# Patient Record
Sex: Female | Born: 1960 | Race: White | Hispanic: No | Smoking: Former smoker
Health system: Southern US, Community
[De-identification: ages and names within clinical notes are randomized; demographics above are authoritative.]

## PROBLEM LIST (undated history)

## (undated) DIAGNOSIS — J449 Chronic obstructive pulmonary disease, unspecified: Secondary | ICD-10-CM

## (undated) DIAGNOSIS — H409 Unspecified glaucoma: Secondary | ICD-10-CM

## (undated) DIAGNOSIS — E119 Type 2 diabetes mellitus without complications: Secondary | ICD-10-CM

## (undated) DIAGNOSIS — I10 Essential (primary) hypertension: Secondary | ICD-10-CM

## (undated) DIAGNOSIS — E785 Hyperlipidemia, unspecified: Secondary | ICD-10-CM

## (undated) DIAGNOSIS — E611 Iron deficiency: Secondary | ICD-10-CM

## (undated) DIAGNOSIS — F259 Schizoaffective disorder, unspecified: Secondary | ICD-10-CM

## (undated) HISTORY — DX: Unspecified glaucoma: H40.9

## (undated) HISTORY — DX: Chronic obstructive pulmonary disease, unspecified: J44.9

## (undated) HISTORY — DX: Iron deficiency: E61.1

## (undated) HISTORY — DX: Schizoaffective disorder, unspecified: F25.9

## (undated) HISTORY — DX: Essential (primary) hypertension: I10

## (undated) HISTORY — PX: TUBAL LIGATION: SHX77

## (undated) HISTORY — DX: Type 2 diabetes mellitus without complications: E11.9

## (undated) HISTORY — DX: Hyperlipidemia, unspecified: E78.5

---

## 2014-12-15 DIAGNOSIS — I10 Essential (primary) hypertension: Secondary | ICD-10-CM | POA: Diagnosis present

## 2014-12-15 DIAGNOSIS — E119 Type 2 diabetes mellitus without complications: Secondary | ICD-10-CM

## 2015-04-24 DIAGNOSIS — F209 Schizophrenia, unspecified: Secondary | ICD-10-CM

## 2015-04-24 DIAGNOSIS — E785 Hyperlipidemia, unspecified: Secondary | ICD-10-CM | POA: Diagnosis present

## 2018-06-05 DIAGNOSIS — D509 Iron deficiency anemia, unspecified: Secondary | ICD-10-CM | POA: Insufficient documentation

## 2018-06-06 DIAGNOSIS — H409 Unspecified glaucoma: Secondary | ICD-10-CM | POA: Diagnosis present

## 2018-09-25 ENCOUNTER — Ambulatory Visit (INDEPENDENT_AMBULATORY_CARE_PROVIDER_SITE_OTHER): Payer: Medicaid Other | Admitting: Obstetrics and Gynecology

## 2018-09-25 ENCOUNTER — Encounter: Payer: Self-pay | Admitting: Obstetrics and Gynecology

## 2018-09-25 ENCOUNTER — Other Ambulatory Visit: Payer: Self-pay

## 2018-09-25 VITALS — HR 77 | Ht 61.0 in | Wt 181.0 lb

## 2018-09-25 DIAGNOSIS — N852 Hypertrophy of uterus: Secondary | ICD-10-CM

## 2018-09-25 DIAGNOSIS — B356 Tinea cruris: Secondary | ICD-10-CM

## 2018-09-25 MED ORDER — NAFTIFINE HCL 2 % EX CREA: 1.0000 "application " | TOPICAL_CREAM | Freq: Every day | CUTANEOUS | 1 refills | Status: DC

## 2018-09-25 MED ORDER — KETOCONAZOLE 200 MG PO TABS: 200.0000 mg | ORAL_TABLET | Freq: Every day | ORAL | 0 refills | Status: DC

## 2018-09-25 NOTE — Progress Notes (Signed)
Patient ID: Teresa Murillo Nourse, female   DOB: 06-29-1960, 58 y.o.   MRN: [id number]  Reason for Consult: Referral (Feels like sshe has vaginal warts, there is a vaginal odor x months )   Referred by Linard MillersWilliams, Christine A, *  Subjective:     HPI:  Teresa Murillo Luis is a 58 y.o. female. She is here as a referral for genital warts. She is living in a group home. Her caregiver with her today says that she wears a diaper throughout the day and night.   Past Medical History:  Diagnosis Date  . Chronic obstructive pulmonary disease (COPD) (HCC)   . Glaucoma   . Hyperlipidemia   . Hypertension   . Iron deficiency   . Schizoaffective disorder (HCC)   . Type 2 diabetes mellitus (HCC)    Family History  Problem Relation Age of Onset  . Breast cancer Maternal Grandmother    Past Surgical History:  Procedure Laterality Date  . TUBAL LIGATION      Short Social History:  Social History   Tobacco Use  . Smoking status: Current Every Day Smoker    Packs/day: 1.00    Types: Cigars  . Smokeless tobacco: Current User  Substance Use Topics  . Alcohol use: Yes    Allergies  Allergen Reactions  . Penicillins     Current Outpatient Medications  Medication Sig Dispense Refill  . acetaminophen (TYLENOL) 500 MG tablet Take 500 mg by mouth every 6 (six) hours as needed.    Marland Kitchen. aspirin 81 MG EC tablet Take 81 mg by mouth daily. Swallow whole.    . benztropine (COGENTIN) 0.5 MG tablet Take 0.5 mg by mouth 2 (two) times daily.    . citalopram (CELEXA) 40 MG tablet Take 40 mg by mouth daily.    . divalproex (DEPAKOTE) 500 MG DR tablet Take 500 mg by mouth 3 (three) times daily.    . haloperidol decanoate (HALDOL DECANOATE) 100 MG/ML injection     . latanoprost (XALATAN) 0.005 % ophthalmic solution 1 drop at bedtime.    Marland Kitchen. lisinopril (ZESTRIL) 10 MG tablet Take 10 mg by mouth daily.    Marland Kitchen. LORazepam (ATIVAN) 0.5 MG tablet Take 0.5 mg by mouth every 8 (eight) hours.    . metFORMIN (GLUMETZA) 500 MG  (MOD) 24 hr tablet Take 500 mg by mouth daily with breakfast.    . metoprolol succinate (TOPROL-XL) 25 MG 24 hr tablet Take 25 mg by mouth daily.    Marland Kitchen. oxybutynin (DITROPAN) 5 MG tablet Take 5 mg by mouth 3 (three) times daily.    . pravastatin (PRAVACHOL) 20 MG tablet Take 20 mg by mouth daily.    Marland Kitchen. triamcinolone ointment (KENALOG) 0.5 % Apply 1 application topically 2 (two) times daily.    Marland Kitchen. ketoconazole (NIZORAL) 200 MG tablet Take 1 tablet (200 mg total) by mouth daily. 28 tablet 0  . Naftifine HCl 2 % CREA Apply 1 application topically daily for 14 days. 60 g 1   No current facility-administered medications for this visit.     Review of Systems  Constitutional: Negative for chills, fatigue, fever and unexpected weight change.  HENT: Negative for trouble swallowing.  Eyes: Negative for loss of vision.  Respiratory: Negative for cough, shortness of breath and wheezing.  Cardiovascular: Negative for chest pain, leg swelling, palpitations and syncope.  GI: Negative for abdominal pain, blood in stool, diarrhea, nausea and vomiting.  GU: Negative for difficulty urinating, dysuria, frequency and hematuria.  Musculoskeletal: Negative for  back pain, leg pain and joint pain.  Skin: Negative for rash.  Neurological: Negative for dizziness, headaches, light-headedness, numbness and seizures.  Psychiatric: Negative for behavioral problem, confusion, depressed mood and sleep disturbance.        Objective:  Objective   Vitals:   09/25/18 0956  Pulse: 77  Weight: 181 lb (82.1 kg)  Height: 5\' 1"  (1.549 m)   Body mass index is 34.2 kg/m.  Physical Exam Vitals signs and nursing note reviewed.  Constitutional:      Appearance: She is well-developed.  HENT:     Head: Normocephalic and atraumatic.  Eyes:     Pupils: Pupils are equal, round, and reactive to light.  Cardiovascular:     Rate and Rhythm: Normal rate and regular rhythm.  Pulmonary:     Effort: Pulmonary effort is normal. No  respiratory distress.  Genitourinary:    Comments: External: Vulva with multiple bilateral sebaceous inclusion cysts. No gential warts seen. Red plaque in left groin.   Speculum examination:  Cervix normal  . No blood in the vaginal vault. no discharge.    Bimanual examination: Uterus enlarged 10-15cm in size. midline, non-tender,no CMT. Adnexa no mass or fullness. Pelvis mobile.  Skin:    General: Skin is warm and dry.  Neurological:     Mental Status: She is alert and oriented to person, place, and time.  Psychiatric:        Behavior: Behavior normal.        Thought Content: Thought content normal.        Judgment: Judgment normal.        Assessment/Plan:     58 yo with multiple complaints.  1. Multiple sebaceous cysts, do not need to be removed. Would recommend sitz baths.  2. Tinea cruris - Red rash on left groin- start with antifungal treatment- given topical and oral medications. If does not improve consider biopsy.  3. Enlarged bulky uterus, follow up 1 month for [place]. Patient denies vaginal bleeding or other symptoms at this time.   More than 25 minutes were spent face to face with the patient in the room with more than 50% of the time spent providing counseling and discussing the plan of management.    Adrian Prows MD [facility] 09/25/2018 10:59 AM

## 2018-09-27 ENCOUNTER — Telehealth: Payer: Self-pay

## 2018-09-27 ENCOUNTER — Telehealth: Payer: Self-pay | Admitting: Obstetrics and Gynecology

## 2018-09-27 ENCOUNTER — Other Ambulatory Visit: Payer: Self-pay

## 2018-09-27 DIAGNOSIS — B356 Tinea cruris: Secondary | ICD-10-CM

## 2018-09-27 MED ORDER — NAFTIFINE HCL 2 % EX CREA: 1.0000 "application " | TOPICAL_CREAM | Freq: Every day | CUTANEOUS | 1 refills | Status: DC

## 2018-09-27 MED ORDER — KETOCONAZOLE 200 MG PO TABS: 200.0000 mg | ORAL_TABLET | Freq: Every day | ORAL | 0 refills | Status: DC

## 2018-09-27 MED ORDER — NAFTIFINE HCL 2 % EX CREA: 1.0000 "application " | TOPICAL_CREAM | Freq: Every day | CUTANEOUS | 1 refills | Status: AC

## 2018-09-27 NOTE — Telephone Encounter (Signed)
Patient was seen 7/27 and prescribed med but calling today stating pharmacy never received if this could be resent.

## 2018-09-27 NOTE — Telephone Encounter (Signed)
Teresa Murillo calling to report she brought patient here on 09/22/2018. She was prescribed medicine but it was never received at the pharmacy [facility] on [address]). QI#[phone]

## 2018-09-27 NOTE — Telephone Encounter (Signed)
Called the pharmacy and the address in the computer was wrong I corrected the address and will retry sending the medications in

## 2018-10-02 NOTE — Telephone Encounter (Signed)
Medications not being covered by Medicaid. Pharmacist "lynn" called asking if there was any way those medications could be switched to a more accepted medication since pt has not been on them before and hasn't tried Nystatin for the antifungal. Please advise. Thank you

## 2018-10-03 ENCOUNTER — Other Ambulatory Visit: Payer: Self-pay | Admitting: Obstetrics and Gynecology

## 2018-10-03 DIAGNOSIS — B356 Tinea cruris: Secondary | ICD-10-CM

## 2018-10-03 MED ORDER — FLUCONAZOLE 150 MG PO TABS: 150.0000 mg | ORAL_TABLET | ORAL | 0 refills | Status: AC

## 2018-10-03 MED ORDER — NYSTATIN-TRIAMCINOLONE 100000-0.1 UNIT/GM-% EX CREA: 1.0000 "application " | TOPICAL_CREAM | Freq: Two times a day (BID) | CUTANEOUS | 1 refills | Status: DC

## 2018-10-03 NOTE — Telephone Encounter (Signed)
I sent a prescription for diflucan oral and nystatin topical. Hopefully this combination will be approved by medicaid. Please notify appropriate people

## 2018-10-23 ENCOUNTER — Other Ambulatory Visit: Payer: Medicaid Other

## 2018-10-23 ENCOUNTER — Ambulatory Visit: Payer: Medicaid Other | Admitting: Obstetrics and Gynecology

## 2018-11-24 ENCOUNTER — Telehealth: Payer: Self-pay | Admitting: Obstetrics and Gynecology

## 2018-11-24 NOTE — Telephone Encounter (Signed)
Unable to reach patient to schedule [place].

## 2019-02-06 ENCOUNTER — Telehealth: Payer: Self-pay | Admitting: Obstetrics and Gynecology

## 2019-02-06 NOTE — Telephone Encounter (Signed)
Jeani Hawking from [facility] calling regarding getting PA on patient.  She can be reached at [phone] today by 5:30 or Monday.

## 2019-02-21 ENCOUNTER — Telehealth: Payer: Self-pay

## 2019-02-21 NOTE — Telephone Encounter (Signed)
I have gotten in contact with a women named Patty at [facility] tracks. I have advised that pt needs a PA for the combination cream. She has advised me that pt has not tried to do the two creams separately which is preferred with Medicaid. I have advised her I will be in contact with the doctor to see if we will be able to change that medication to separate ones so that they may be covered by medicaid. ID number for the call was [id number]

## 2019-02-22 ENCOUNTER — Other Ambulatory Visit: Payer: Self-pay | Admitting: Obstetrics and Gynecology

## 2019-02-22 DIAGNOSIS — B356 Tinea cruris: Secondary | ICD-10-CM

## 2019-02-22 MED ORDER — NYSTATIN 100000 UNIT/GM EX CREA: 1.0000 "application " | TOPICAL_CREAM | Freq: Two times a day (BID) | CUTANEOUS | 6 refills | Status: DC

## 2019-02-22 NOTE — Telephone Encounter (Signed)
I sent a prescription for nystatin alone- will hold off on the steroid component for the time being. Please notify patient. Thank you,  Dr. Gilman Schmidt

## 2019-02-22 NOTE — Telephone Encounter (Signed)
Spoke with Jeani Hawking from [facility] and have done a verbal D/C order on that combination cream. Pt was notified that she only needs to be doing the nystatin cream which is covered by medicaid and if the nystatin does not help to give [place] a call.

## 2019-11-08 ENCOUNTER — Other Ambulatory Visit: Payer: Self-pay | Admitting: Obstetrics and Gynecology

## 2019-11-08 DIAGNOSIS — B356 Tinea cruris: Secondary | ICD-10-CM

## 2019-11-08 NOTE — Telephone Encounter (Signed)
Please see refill request.

## 2019-12-04 ENCOUNTER — Other Ambulatory Visit: Payer: Self-pay | Admitting: Obstetrics and Gynecology

## 2019-12-04 DIAGNOSIS — B356 Tinea cruris: Secondary | ICD-10-CM

## 2019-12-06 NOTE — Telephone Encounter (Signed)
See refill

## 2020-06-01 ENCOUNTER — Other Ambulatory Visit: Payer: Self-pay

## 2020-06-01 ENCOUNTER — Emergency Department
Admission: EM | Admit: 2020-06-01 | Discharge: 2020-06-04 | Disposition: A | Discharge status: Other Acute Inpatient Hospital | Payer: No Typology Code available for payment source | Attending: Emergency Medicine | Admitting: Emergency Medicine

## 2020-06-01 DIAGNOSIS — Z79899 Other long term (current) drug therapy: Secondary | ICD-10-CM | POA: Diagnosis not present

## 2020-06-01 DIAGNOSIS — F203 Undifferentiated schizophrenia: Secondary | ICD-10-CM | POA: Diagnosis not present

## 2020-06-01 DIAGNOSIS — Z20822 Contact with and (suspected) exposure to covid-19: Secondary | ICD-10-CM | POA: Diagnosis not present

## 2020-06-01 DIAGNOSIS — Z7982 Long term (current) use of aspirin: Secondary | ICD-10-CM | POA: Diagnosis not present

## 2020-06-01 DIAGNOSIS — J449 Chronic obstructive pulmonary disease, unspecified: Secondary | ICD-10-CM | POA: Diagnosis not present

## 2020-06-01 DIAGNOSIS — E119 Type 2 diabetes mellitus without complications: Secondary | ICD-10-CM | POA: Insufficient documentation

## 2020-06-01 DIAGNOSIS — S82899A Other fracture of unspecified lower leg, initial encounter for closed fracture: Secondary | ICD-10-CM

## 2020-06-01 DIAGNOSIS — Z7984 Long term (current) use of oral hypoglycemic drugs: Secondary | ICD-10-CM | POA: Insufficient documentation

## 2020-06-01 DIAGNOSIS — F17290 Nicotine dependence, other tobacco product, uncomplicated: Secondary | ICD-10-CM | POA: Insufficient documentation

## 2020-06-01 DIAGNOSIS — F209 Schizophrenia, unspecified: Secondary | ICD-10-CM

## 2020-06-01 DIAGNOSIS — R4689 Other symptoms and signs involving appearance and behavior: Secondary | ICD-10-CM

## 2020-06-01 DIAGNOSIS — R456 Violent behavior: Secondary | ICD-10-CM | POA: Diagnosis present

## 2020-06-01 DIAGNOSIS — I10 Essential (primary) hypertension: Secondary | ICD-10-CM | POA: Insufficient documentation

## 2020-06-01 DIAGNOSIS — F259 Schizoaffective disorder, unspecified: Secondary | ICD-10-CM

## 2020-06-01 LAB — CBC
HCT: 38.8 % (ref 36.0–46.0)
Hemoglobin: 13.3 g/dL (ref 12.0–15.0)
MCH: 29.6 pg (ref 26.0–34.0)
MCHC: 34.3 g/dL (ref 30.0–36.0)
MCV: 86.4 fL (ref 80.0–100.0)
Platelets: 310 10*3/uL (ref 150–400)
RBC: 4.49 MIL/uL (ref 3.87–5.11)
RDW: 13.9 % (ref 11.5–15.5)
WBC: 8.4 10*3/uL (ref 4.0–10.5)
nRBC: 0 % (ref 0.0–0.2)

## 2020-06-01 LAB — COMPREHENSIVE METABOLIC PANEL
ALT: 13 U/L (ref 0–44)
AST: 16 U/L (ref 15–41)
Albumin: 3.6 g/dL (ref 3.5–5.0)
Alkaline Phosphatase: 60 U/L (ref 38–126)
Anion gap: 10 (ref 5–15)
BUN: 7 mg/dL (ref 6–20)
CO2: 24 mmol/L (ref 22–32)
Calcium: 9.1 mg/dL (ref 8.9–10.3)
Chloride: 100 mmol/L (ref 98–111)
Creatinine, Ser: 0.49 mg/dL (ref 0.44–1.00)
GFR, Estimated: >60 mL/min (ref >60)
Glucose, Bld: 198 mg/dL — ABNORMAL HIGH (ref 70–99)
Potassium: 3.7 mmol/L (ref 3.5–5.1)
Sodium: 134 mmol/L — ABNORMAL LOW (ref 135–145)
Total Bilirubin: 0.7 mg/dL (ref 0.3–1.2)
Total Protein: 6.4 g/dL — ABNORMAL LOW (ref 6.5–8.1)

## 2020-06-01 LAB — ETHANOL: Alcohol, Ethyl (B): <10 mg/dL (ref <10)

## 2020-06-01 LAB — RESP PANEL BY RT-PCR (FLU A&B, COVID) ARPGX2
Influenza A by PCR: NEGATIVE
Influenza B by PCR: NEGATIVE
SARS Coronavirus 2 by RT PCR: NEGATIVE

## 2020-06-01 LAB — SALICYLATE LEVEL: Salicylate Lvl: <7 mg/dL — ABNORMAL LOW (ref 7.0–30.0)

## 2020-06-01 LAB — ACETAMINOPHEN LEVEL: Acetaminophen (Tylenol), Serum: <10 ug/mL — ABNORMAL LOW (ref 10–30)

## 2020-06-01 MED ORDER — ZIPRASIDONE MESYLATE 20 MG IM SOLR
20.0000 mg | Freq: Once | INTRAMUSCULAR | Status: AC
  Administered 2020-06-01: 20 mg via INTRAMUSCULAR
  Filled 2020-06-01: qty 20

## 2020-06-01 NOTE — ED Notes (Addendum)
Patient arrives from group home with right foot cast, and wearing one sneaker. Patient wearing multiple layers, appear disheveled, and has body order.  Patient assisted into w/c and taken to bathroom for dress out, patient became verbally aggressive having word salads," patient stated I haven't eaten, people beating on me, don't call the cops on me, don't put me in the dirt,, Your not taking my clothes". Patient posturing and became very  Loud swing her hands when staff attempted to help her with her clothes. Md made aware. IM meds administered, patient willing allowed staff to medicated her.

## 2020-06-01 NOTE — ED Notes (Signed)
Pt dressed out by this tech and RN.   Belongings:  4 shirts Jumper Pants Belt Two necklaces

## 2020-06-01 NOTE — ED Provider Notes (Signed)
[facility] Emergency Department Provider Note  Time seen: 4:18 PM  I have reviewed the triage vital signs and the nursing notes.   HISTORY  Chief Complaint Aggressive Behavior   HPI Teresa Murillo is a 60 y.o. female with a past medical history of COPD, hypertension, hyperlipidemia, schizophrenia, diabetes, presents emergency department from her group facility for aggressive behavior.   Patient is a poor historian cannot contribute much to her history.  Patient denies any medical complaints.  Is awake alert and oriented but at times refuses to answer questions for instance how long as her cast been on her leg.  She will just look at you and not answer.  Denies SI or HI.  Past Medical History:  Diagnosis Date  . Chronic obstructive pulmonary disease (COPD) (HCC)   . Glaucoma   . Hyperlipidemia   . Hypertension   . Iron deficiency   . Schizoaffective disorder (HCC)   . Type 2 diabetes mellitus (HCC)     There are no problems to display for this patient.   Past Surgical History:  Procedure Laterality Date  . TUBAL LIGATION      Prior to Admission medications   Medication Sig Start Date End Date Taking? Authorizing Provider  acetaminophen (TYLENOL) 500 MG tablet Take 500 mg by mouth every 6 (six) hours as needed.    [provider]  aspirin 81 MG EC tablet Take 81 mg by mouth daily. Swallow whole.    [provider]  benztropine (COGENTIN) 0.5 MG tablet Take 0.5 mg by mouth 2 (two) times daily.    [provider]  citalopram (CELEXA) 40 MG tablet Take 40 mg by mouth daily.    [provider]  divalproex (DEPAKOTE) 500 MG DR tablet Take 500 mg by mouth 3 (three) times daily.    [provider]  haloperidol decanoate (HALDOL DECANOATE) 100 MG/ML injection  07/18/18   [provider]  latanoprost (XALATAN) 0.005 % ophthalmic solution 1 drop at bedtime.    [provider]  lisinopril (ZESTRIL) 10  MG tablet Take 10 mg by mouth daily.    [provider]  LORazepam (ATIVAN) 0.5 MG tablet Take 0.5 mg by mouth every 8 (eight) hours.    [provider]  metFORMIN (GLUMETZA) 500 MG (MOD) 24 hr tablet Take 500 mg by mouth daily with breakfast.    [provider]  metoprolol succinate (TOPROL-XL) 25 MG 24 hr tablet Take 25 mg by mouth daily.    [provider]  nystatin cream (MYCOSTATIN) APPLY 1 APPLICATION TOPICALLY TWICE DAILY TO AFFECTED AREA 12/06/19   Schuman, Christanna R, MD  nystatin-triamcinolone (MYCOLOG II) cream Apply 1 application topically 2 (two) times daily. 10/03/18   Schuman, Christanna R, MD  oxybutynin (DITROPAN) 5 MG tablet Take 5 mg by mouth 3 (three) times daily.    [provider]  pravastatin (PRAVACHOL) 20 MG tablet Take 20 mg by mouth daily.    [provider]  triamcinolone ointment (KENALOG) 0.5 % Apply 1 application topically 2 (two) times daily.    [provider]    Allergies  Allergen Reactions  . Penicillins     Family History  Problem Relation Age of Onset  . Breast cancer Maternal Grandmother     Social History Social History   Tobacco Use  . Smoking status: Current Every Day Smoker    Packs/day: 1.00    Types: Cigars  . Smokeless tobacco: Current User  Vaping Use  .  Vaping Use: Never used  Substance Use Topics  . Alcohol use: Yes  . Drug use: Not Currently    Review of Systems Constitutional: Negative for fever Cardiovascular: Negative for chest pain. Respiratory: Negative for shortness of breath. Gastrointestinal: Negative for abdominal pain, vomiting Musculoskeletal: Cast on right lower extremity Neurological: Negative for headache All other ROS negative  ____________________________________________   PHYSICAL EXAM:  VITAL SIGNS: ED Triage Vitals  Enc Vitals Group     BP 06/01/20 1611 (!) 152/98     Pulse Rate 06/01/20 1611 (!) 123     Resp 06/01/20 1611 17      Temp 06/01/20 1611 97.8 F (36.6 C)     Temp src --      SpO2 06/01/20 1611 93 %     Weight 06/01/20 1617 153 lb (69.4 kg)     Height 06/01/20 1617 5' (1.524 m)     Head Circumference --      Peak Flow --      Pain Score 06/01/20 1616 0     Pain Loc --      Pain Edu? --      Excl. in GC? --    Constitutional: She is awake alert and oriented.  At times she answers questions correctly and other times she will just look at you and not answer. Eyes: Normal exam ENT      Head: Normocephalic and atraumatic.      Mouth/Throat: Mucous membranes are moist. Cardiovascular: Normal rate, regular rhythm. Respiratory: Normal respiratory effort without tachypnea nor retractions. Breath sounds are clea Gastrointestinal: Soft and nontender. No distention.  Musculoskeletal: Patient has a cast on her right lower extremity. Neurologic:  Normal speech and language. No gross focal neurologic deficits  Skin:  Skin is warm, dry and intact.  Psychiatric: At time appears to have a normal mood and affect at other times she will stare at you and not answer your questions.  ____________________________________________   INITIAL IMPRESSION / ASSESSMENT AND PLAN / ED COURSE  Pertinent labs & imaging results that were available during my care of the patient were reviewed by me and considered in my medical decision making (see chart for details).   Patient presents emergency department for aggressive behavior at her group facility.  Denies SI or HI did not come under IVC.  We will keep the patient here voluntarily have psychiatry evaluate we will check labs and continue to closely monitor.   [name] was evaluated in Emergency Department on 06/01/2020 for the symptoms described in the history of present illness. She was evaluated in the context of the global COVID-19 pandemic, which necessitated consideration that the patient might be at risk for infection with the SARS-CoV-2 virus that causes COVID-19.  Institutional protocols and algorithms that pertain to the evaluation of patients at risk for COVID-19 are in a state of rapid change based on information released by regulatory bodies including the CDC and federal and state organizations. These policies and algorithms were followed during the patient's care in the ED.  The patient has been placed in psychiatric observation due to the need to provide a safe environment for the patient while obtaining psychiatric consultation and evaluation, as well as ongoing medical and medication management to treat the patient's condition.  The patient has not been placed under full IVC at this time.  ____________________________________________   FINAL CLINICAL IMPRESSION(S) / ED DIAGNOSES  Schizophrenia Aggressive behavior   Minna Antis, MD 06/01/20 [phone]

## 2020-06-01 NOTE — ED Notes (Signed)
Called [facility] for consult per Dr. Lenard Lance [phone]

## 2020-06-01 NOTE — ED Notes (Signed)
Sandwich tray and drink given to pt.

## 2020-06-01 NOTE — ED Notes (Signed)
Patient sitting on bed having hallucinations, patient speaking to someone not there" telling them them to leave her alone and she is not talking to them."

## 2020-06-01 NOTE — BH Assessment (Signed)
Comprehensive Clinical Assessment (CCA) Screening, Triage and Referral Note  06/01/2020 Teresa Murillo [id number]   Teresa Murillo is an 60 y.o female who presents to [facility] ED voluntarily for treatment. Per triage note, Patient sent from group home with c/o of aggressive behavior. Patient poor historian.  During TTS assessment pt presents manic, disorganized, poor historian, confused, disoriented but cooperative, and mood-congruent with affect. The pt does appear to be responding to internal or external stimuli but is not presenting with any delusional thinking. Pt was unable to verify the information provided to triage RN. Pt identified her main complaint to be lack of sleep and grew tangential during discussion of her group home. Pt presented with an unclear and disorganized thought process making it difficult for her to fully engage in the assessment. Pt denies any current SI/HI/AH/VH but due to current presentation is a safety risk to self and others.   Collateral gathered by [facility] staff Teresa Murillo) ([facility] [phone]): Teresa Murillo confirmed pt residency and expressed concern with pt's increased agitation, restlessness, incoherence, disorganization, aggression towards staff/residents, cognitive decline and lack of personal care. Teresa Murillo reports a change in pt's baseline (cooperative, talkative, interactive, nice, performing ADLS) in the last two weeks but noticed an increase in the last two days. Teresa Murillo identified her main concern to be pt's rapid decline from her baseline, pt's safety and the safety of other residents in the [facility]. Teresa Murillo reports pt to be unable to return to the [facility] today and the need to discuss pt's return with the owner (Teresa Murillo [phone]). Teresa Murillo reports feeling pt's medications need to be reviewed and the need for pt to be assessed for dementia.   Final disposition pending SOC   Chief Complaint:  Chief Complaint  Patient presents with  . Aggressive Behavior   Visit Diagnosis: Aggressive  behavior   Patient Reported Information How did you hear about [place]? Self   Referral name: [facility])   Referral phone number: [phone]  Whom do you see for routine medical problems? I don't have a doctor   Practice/Facility Name: No data recorded  Practice/Facility Phone Number: No data recorded  Name of Contact: No data recorded  Contact Number: No data recorded  Contact Fax Number: No data recorded  Prescriber Name: No data recorded  Prescriber Address (if known): No data recorded What Is the Reason for Your Visit/Call Today? Altered mental status  How Long Has This Been Causing You Problems? <Week  Have You Recently Been in Any Inpatient Treatment (Hospital/Detox/Crisis Center/28-Day Program)? No   Name/Location of Program/Hospital:No data recorded  How Long Were You There? No data recorded  When Were You Discharged? No data recorded Have You Ever Received Services From [facility] Before? Yes   Who Do You See at [facility]? ED  Have You Recently Had Any Thoughts About Hurting Yourself? No   Are You Planning to Commit Suicide/Harm Yourself At This time?  No  Have you Recently Had Thoughts About Hurting Someone Teresa Murillo? No   Explanation: No data recorded Have You Used Any Alcohol or Drugs in the Past 24 Hours? No   How Long Ago Did You Use Drugs or Alcohol?  No data recorded  What Did You Use and How Much? No data recorded What Do You Feel Would Help You the Most Today? Medication(s)  Do You Currently Have a Therapist/Psychiatrist? No   Name of Therapist/Psychiatrist: No data recorded  Have You Been Recently Discharged From Any Office Practice or Programs? No   Explanation  of Discharge From Practice/Program:  No data recorded    CCA Screening Triage Referral Assessment Type of Contact: Face-to-Face   Is this Initial or Reassessment? No data recorded  Date Telepsych consult ordered in [facility]:  No data recorded  Time Telepsych consult ordered in [facility]:  No  data recorded Patient Reported Information Reviewed? Yes   Patient Left Without Being Seen? No data recorded  Reason for Not Completing Assessment: No data recorded [facility]  Does Patient Have a Court Appointed Legal Guardian? No data recorded  Name and Contact of Legal Guardian:  No data recorded If Minor and Not Living with Parent(s), Who has Custody? N/A  Is CPS involved or ever been involved? Never  Is APS involved or ever been involved? Never  Patient Determined To Be At Risk for Harm To Self or Others Based on Review of Patient Reported Information or Presenting Complaint? No   Method: No data recorded  Availability of Means: No data recorded  Intent: No data recorded  Notification Required: No data recorded  Additional Information for Danger to Others Potential:  No data recorded  Additional Comments for Danger to Others Potential:  No data recorded  Are There Guns or Other Weapons in Your Home?  No data recorded   Types of Guns/Weapons: No data recorded   Are These Weapons Safely Secured?                              No data recorded   Who Could Verify You Are Able To Have These Secured:    No data recorded Do You Have any Outstanding Charges, Pending Court Dates, Parole/Probation? No data recorded Contacted To Inform of Risk of Harm To Self or Others: No data recorded Location of Assessment: [facility] ED  Does Patient Present under Involuntary Commitment? No   IVC Papers Initial File Date: No data recorded  [place] of Residence: [place]  Patient Currently Receiving the Following Services: Not Receiving Services   Determination of Need: Emergent (2 hours)   Options For Referral: Medication Management; Intensive Outpatient Therapy; Inpatient Hospitalization   Teresa Murillo, LCSWA

## 2020-06-01 NOTE — ED Notes (Signed)
Pt given dinner tray and water. 

## 2020-06-01 NOTE — ED Triage Notes (Signed)
Patient sent from group home with c/o of aggressive behavior. Patient poor historian.

## 2020-06-01 NOTE — ED Notes (Signed)
This tech and RN attempted to change out pt. Pt refused to comply or let us help her. She threatened [place] if we touched her. We took her back to her bed where she is at this time.

## 2020-06-02 ENCOUNTER — Emergency Department: Payer: No Typology Code available for payment source

## 2020-06-02 DIAGNOSIS — F203 Undifferentiated schizophrenia: Secondary | ICD-10-CM | POA: Diagnosis not present

## 2020-06-02 DIAGNOSIS — S82899A Other fracture of unspecified lower leg, initial encounter for closed fracture: Secondary | ICD-10-CM

## 2020-06-02 DIAGNOSIS — F209 Schizophrenia, unspecified: Secondary | ICD-10-CM

## 2020-06-02 DIAGNOSIS — J449 Chronic obstructive pulmonary disease, unspecified: Secondary | ICD-10-CM

## 2020-06-02 LAB — URINALYSIS, COMPLETE (UACMP) WITH MICROSCOPIC
Bacteria, UA: NONE SEEN
Bilirubin Urine: NEGATIVE
Glucose, UA: NEGATIVE mg/dL
Hgb urine dipstick: NEGATIVE
Ketones, ur: NEGATIVE mg/dL
Nitrite: NEGATIVE
Protein, ur: NEGATIVE mg/dL
Specific Gravity, Urine: 1.013 (ref 1.005–1.030)
pH: 6 (ref 5.0–8.0)

## 2020-06-02 LAB — URINE DRUG SCREEN, QUALITATIVE (ARMC ONLY)
Amphetamines, Ur Screen: NOT DETECTED
Barbiturates, Ur Screen: NOT DETECTED
Benzodiazepine, Ur Scrn: NOT DETECTED
Cannabinoid 50 Ng, Ur ~~LOC~~: NOT DETECTED
Cocaine Metabolite,Ur ~~LOC~~: NOT DETECTED
MDMA (Ecstasy)Ur Screen: NOT DETECTED
Methadone Scn, Ur: NOT DETECTED
Opiate, Ur Screen: NOT DETECTED
Phencyclidine (PCP) Ur S: NOT DETECTED
Tricyclic, Ur Screen: NOT DETECTED

## 2020-06-02 LAB — PROCALCITONIN: Procalcitonin: <0.1 ng/mL

## 2020-06-02 LAB — CBG MONITORING, ED
Glucose-Capillary: 253 mg/dL — ABNORMAL HIGH (ref 70–99)
Glucose-Capillary: 116 mg/dL — ABNORMAL HIGH (ref 70–99)
Glucose-Capillary: 166 mg/dL — ABNORMAL HIGH (ref 70–99)

## 2020-06-02 MED ORDER — FERROUS SULFATE 325 (65 FE) MG PO TABS
325.0000 mg | ORAL_TABLET | Freq: Every day | ORAL | Status: DC
  Administered 2020-06-03 – 2020-06-04 (×2): 325 mg via ORAL
  Filled 2020-06-02 (×2): qty 1

## 2020-06-02 MED ORDER — DIVALPROEX SODIUM 500 MG PO DR TAB
1250.0000 mg | DELAYED_RELEASE_TABLET | Freq: Every day | ORAL | Status: DC
  Administered 2020-06-03 (×2): 1250 mg via ORAL
  Filled 2020-06-02 (×2): qty 2

## 2020-06-02 MED ORDER — ARIPIPRAZOLE 10 MG PO TABS
10.0000 mg | ORAL_TABLET | Freq: Every day | ORAL | Status: DC
  Administered 2020-06-02 – 2020-06-04 (×3): 10 mg via ORAL
  Filled 2020-06-02 (×3): qty 1

## 2020-06-02 MED ORDER — INSULIN ASPART 100 UNIT/ML ~~LOC~~ SOLN
0.0000 [IU] | Freq: Three times a day (TID) | SUBCUTANEOUS | Status: DC
  Administered 2020-06-03: 2 [IU] via SUBCUTANEOUS
  Filled 2020-06-02: qty 1

## 2020-06-02 MED ORDER — PRAVASTATIN SODIUM 20 MG PO TABS
20.0000 mg | ORAL_TABLET | Freq: Every evening | ORAL | Status: DC
  Administered 2020-06-02: 20 mg via ORAL
  Filled 2020-06-02 (×4): qty 1

## 2020-06-02 MED ORDER — ASCORBIC ACID 500 MG PO TABS
500.0000 mg | ORAL_TABLET | Freq: Two times a day (BID) | ORAL | Status: DC
  Administered 2020-06-02 – 2020-06-04 (×5): 500 mg via ORAL
  Filled 2020-06-02 (×5): qty 1

## 2020-06-02 MED ORDER — HALOPERIDOL LACTATE 5 MG/ML IJ SOLN
5.0000 mg | Freq: Once | INTRAMUSCULAR | Status: AC
  Administered 2020-06-02: 5 mg via INTRAMUSCULAR
  Filled 2020-06-02: qty 1

## 2020-06-02 MED ORDER — CITALOPRAM HYDROBROMIDE 20 MG PO TABS
40.0000 mg | ORAL_TABLET | Freq: Every day | ORAL | Status: DC
  Administered 2020-06-02 – 2020-06-04 (×3): 40 mg via ORAL
  Filled 2020-06-02 (×3): qty 2

## 2020-06-02 MED ORDER — TRAZODONE HCL 50 MG PO TABS
150.0000 mg | ORAL_TABLET | Freq: Every day | ORAL | Status: DC
  Administered 2020-06-03 (×2): 150 mg via ORAL
  Filled 2020-06-02 (×2): qty 1

## 2020-06-02 MED ORDER — BUPROPION HCL ER (XL) 150 MG PO TB24
300.0000 mg | ORAL_TABLET | Freq: Every day | ORAL | Status: DC
  Administered 2020-06-02 – 2020-06-03 (×2): 300 mg via ORAL
  Filled 2020-06-02 (×2): qty 2

## 2020-06-02 MED ORDER — BENZTROPINE MESYLATE 1 MG PO TABS: 0.5000 mg | ORAL_TABLET | Freq: Two times a day (BID) | ORAL | Status: DC | PRN

## 2020-06-02 MED ORDER — OXYBUTYNIN CHLORIDE 5 MG PO TABS
5.0000 mg | ORAL_TABLET | Freq: Every day | ORAL | Status: DC
  Administered 2020-06-02 – 2020-06-04 (×3): 5 mg via ORAL
  Filled 2020-06-02 (×3): qty 1

## 2020-06-02 MED ORDER — LORAZEPAM 2 MG/ML IJ SOLN
2.0000 mg | Freq: Once | INTRAMUSCULAR | Status: AC
  Administered 2020-06-02: 2 mg via INTRAMUSCULAR
  Filled 2020-06-02: qty 1

## 2020-06-02 MED ORDER — ACETAMINOPHEN 500 MG PO TABS
1000.0000 mg | ORAL_TABLET | Freq: Once | ORAL | Status: AC
  Administered 2020-06-03: 1000 mg via ORAL
  Filled 2020-06-02: qty 2

## 2020-06-02 MED ORDER — ASPIRIN EC 81 MG PO TBEC
81.0000 mg | DELAYED_RELEASE_TABLET | Freq: Every day | ORAL | Status: DC
  Administered 2020-06-02 – 2020-06-04 (×3): 81 mg via ORAL
  Filled 2020-06-02 (×3): qty 1

## 2020-06-02 MED ORDER — METFORMIN HCL 500 MG PO TABS
500.0000 mg | ORAL_TABLET | Freq: Two times a day (BID) | ORAL | Status: DC
  Administered 2020-06-02 – 2020-06-04 (×3): 500 mg via ORAL
  Filled 2020-06-02 (×3): qty 1

## 2020-06-02 MED ORDER — INSULIN ASPART 100 UNIT/ML ~~LOC~~ SOLN
0.0000 [IU] | Freq: Every day | SUBCUTANEOUS | Status: DC
  Filled 2020-06-02: qty 1

## 2020-06-02 NOTE — ED Provider Notes (Signed)
Emergency Medicine Observation Re-evaluation Note  Teresa Murillo is a 60 y.o. female, seen on rounds today.  Pt initially presented to the ED for complaints of Aggressive Behavior Currently, the patient is awake and alert but not agitated and denies any complaints.  Physical Exam  BP 131/64   Pulse (!) 109   Temp 97.8 F (36.6 C) (Oral)   Resp 16   Ht 5' (1.524 m)   Wt 69.4 kg   SpO2 98%   BMI 29.88 kg/m  Physical Exam Gen: No acute distress  Resp: Normal rise and fall of chest Neuro: Moving all four extremities Psych: Resting currently, calm and cooperative when awake    ED Course / MDM  EKG:   I have reviewed the labs performed to date as well as medications administered while in observation.  Recent changes in the last 24 hours include no acute events overnight.  Plan  Current plan is for psychiatric evaluation for disposition. Patient is not under full IVC at this time.   Roma Bondar, Layla Maw, DO 06/02/20 [phone]

## 2020-06-02 NOTE — ED Notes (Signed)
New urine sample sent

## 2020-06-02 NOTE — ED Notes (Signed)
Voluntarily took IM medications. Pt with random outburts of yelling about "belts" and "they're gonna kill you".

## 2020-06-02 NOTE — Consult Note (Signed)
Brief note.  Full note to follow.  Patient seen chart reviewed.  Patient may be considered psychiatrically cleared and does not need admission to psychiatric ward.

## 2020-06-02 NOTE — ED Notes (Signed)
Pt on phone with [facility] now.

## 2020-06-02 NOTE — Progress Notes (Signed)
Inpatient Diabetes Program Recommendations  AACE/ADA: New Consensus Statement on Inpatient Glycemic Control  Target Ranges:  Prepandial:   less than 140 mg/dL      Peak postprandial:   less than 180 mg/dL (1-2 hours)      Critically ill patients:  140 - 180 mg/dL   Results for VANESA, RENIER (MRN [id number]) as of 06/02/2020 11:09  Ref. Range 06/02/2020 10:01  Glucose-Capillary Latest Ref Range: 70 - 99 mg/dL 326 (H)  Results for ABIGAELLE, VERLEY (MRN [id number]) as of 06/02/2020 11:09  Ref. Range 06/01/2020 16:23  Glucose Latest Ref Range: 70 - 99 mg/dL 833 (H)   Review of Glycemic Control  Diabetes history: DM2 Outpatient Diabetes medications: Metformin 500 mg BID Current orders for Inpatient glycemic control: Metformin 500 mg BID  Inpatient Diabetes Program Recommendations:    Insulin: While holding in the ER, please consider ordering CBGs AC&HS with Novolog 0-9 units TID with meals and Novolog 0-5 units QHS.  Thanks, Orlando Penner, RN, MSN, CDE Diabetes Coordinator Inpatient Diabetes Program [phone] (Team Pager from 8am to 5pm)

## 2020-06-02 NOTE — ED Notes (Addendum)
Secure chat Fuller Plan, EDP regarding pt last set of VS as well as diabetes coordinator note recommendations.

## 2020-06-02 NOTE — ED Notes (Signed)
To bathroom to provide sample.

## 2020-06-02 NOTE — Consult Note (Signed)
[facility] Consult   Reason for Consult: Consult for this 60 year old woman with a history of schizophrenia who was brought voluntarily from her group home Referring Physician: Su HoffJesup Patient Identification: Teresa Murillo MRN:  [id number] Principal Diagnosis: Schizophrenia [facility](HCC) Diagnosis:  Principal Problem:   Schizophrenia (HCC) Active Problems:   COPD (chronic obstructive pulmonary disease) (HCC)   Broken ankle   Total Time spent with patient: 1 hour  Subjective:   Teresa Murillo is a 60 y.o. female patient admitted with "they brought me".  HPI: Patient seen chart reviewed.  Patient is a 60 year old woman with a history of schizophrenia.  Brought here voluntarily by her group home.  Little information available.  Reports that the patient has been increasingly aggressive and agitated recently.  Patient is mostly cooperative in the ER.  Somewhat difficult to interview.  Frequently responding to internal stimuli and glancing around.  A lot of her conversation is hard to follow.  She denies any suicidal or homicidal ideation.  She ultimately expresses that her chief complaint is wanting a different group home.  Denies any drug or alcohol use.  Claims to be compliant with the medicine she is prescribed.  Past Psychiatric History: Patient has a history of schizophrenia last admission we know up was 2 years ago when she had last been thrown out of a group home.  [facility].  Patient denies any history of suicide attempts or violence.  Not necessarily a reliable historian.  Risk to Self:   Risk to Others:   Prior Inpatient Therapy:   Prior Outpatient Therapy:    Past Medical History:  Past Medical History:  Diagnosis Date  . Chronic obstructive pulmonary disease (COPD) (HCC)   . Glaucoma   . Hyperlipidemia   . Hypertension   . Iron deficiency   . Schizoaffective disorder (HCC)   . Type 2 diabetes mellitus (HCC)     Past Surgical History:  Procedure Laterality  Date  . TUBAL LIGATION     Family History:  Family History  Problem Relation Age of Onset  . Breast cancer Maternal Grandmother    Family Psychiatric  History: None known Social History:  Social History   Substance and Sexual Activity  Alcohol Use Yes     Social History   Substance and Sexual Activity  Drug Use Not Currently    Social History   Socioeconomic History  . Marital status: Single    Spouse name: Not on file  . Number of children: Not on file  . Years of education: Not on file  . Highest education level: Not on file  Occupational History  . Not on file  Tobacco Use  . Smoking status: Current Every Day Smoker    Packs/day: 1.00    Types: Cigars  . Smokeless tobacco: Current User  Vaping Use  . Vaping Use: Never used  Substance and Sexual Activity  . Alcohol use: Yes  . Drug use: Not Currently  . Sexual activity: Not Currently    Birth control/protection: Surgical  Other Topics Concern  . Not on file  Social History Narrative  . Not on file   Social Determinants of Health   Financial Resource Strain: Not on file  Food Insecurity: Not on file  Transportation Needs: Not on file  Physical Activity: Not on file  Stress: Not on file  Social Connections: Not on file   Additional Social History:    Allergies:   Allergies  Allergen Reactions  . Penicillins  Labs:  Results for orders placed or performed during the hospital encounter of 06/01/20 (from the past 48 hour(s))  Resp Panel by RT-PCR (Flu A&B, Covid) Nasopharyngeal Swab     Status: None   Collection Time: 06/01/20  4:23 PM   Specimen: Nasopharyngeal Swab; Nasopharyngeal(NP) swabs in vial transport medium  Result Value Ref Range   SARS Coronavirus 2 by RT PCR NEGATIVE NEGATIVE    Comment: (NOTE) SARS-CoV-2 target nucleic acids are NOT DETECTED.  The SARS-CoV-2 RNA is generally detectable in upper respiratory specimens during the acute phase of infection. The lowest concentration  of SARS-CoV-2 viral copies this assay can detect is 138 copies/mL. A negative result does not preclude SARS-Cov-2 infection and should not be used as the sole basis for treatment or other patient management decisions. A negative result may occur with  improper specimen collection/handling, submission of specimen other than nasopharyngeal swab, presence of viral mutation(s) within the areas targeted by this assay, and inadequate number of viral copies(<138 copies/mL). A negative result must be combined with clinical observations, patient history, and epidemiological information. The expected result is Negative.  Fact Sheet for Patients:  BloggerCourse.com  Fact Sheet for Healthcare Providers:  SeriousBroker.it  This test is no t yet approved or cleared by the [place] FDA and  has been authorized for detection and/or diagnosis of SARS-CoV-2 by FDA under an Emergency Use Authorization (EUA). This EUA will remain  in effect (meaning this test can be used) for the duration of the COVID-19 declaration under Section 564(b)(1) of the Act, 21 U.S.C.section 360bbb-3(b)(1), unless the authorization is terminated  or revoked sooner.       Influenza A by PCR NEGATIVE NEGATIVE   Influenza B by PCR NEGATIVE NEGATIVE    Comment: (NOTE) The Xpert Xpress SARS-CoV-2/FLU/RSV plus assay is intended as an aid in the diagnosis of influenza from Nasopharyngeal swab specimens and should not be used as a sole basis for treatment. Nasal washings and aspirates are unacceptable for Xpert Xpress SARS-CoV-2/FLU/RSV testing.  Fact Sheet for Patients: BloggerCourse.com  Fact Sheet for Healthcare Providers: SeriousBroker.it  This test is not yet approved or cleared by the [place] FDA and has been authorized for detection and/or diagnosis of SARS-CoV-2 by FDA under an Emergency Use Authorization  (EUA). This EUA will remain in effect (meaning this test can be used) for the duration of the COVID-19 declaration under Section 564(b)(1) of the Act, 21 U.S.C. section 360bbb-3(b)(1), unless the authorization is terminated or revoked.  Performed at [facility], [address] [place], [place] [address]   CBC     Status: None   Collection Time: 06/01/20  4:23 PM  Result Value Ref Range   WBC 8.4 4.0 - 10.5 K/uL   RBC 4.49 3.87 - 5.11 MIL/uL   Hemoglobin 13.3 12.0 - 15.0 g/dL   HCT 91.4 78.2 - 95.6 %   MCV 86.4 80.0 - 100.0 fL   MCH 29.6 26.0 - 34.0 pg   MCHC 34.3 30.0 - 36.0 g/dL   RDW 21.3 08.6 - 57.8 %   Platelets 310 150 - 400 K/uL   nRBC 0.0 0.0 - 0.2 %    Comment: Performed at [facility], [address], [place], [place] [address]  Comprehensive metabolic panel     Status: Abnormal   Collection Time: 06/01/20  4:23 PM  Result Value Ref Range   Sodium 134 (L) 135 - 145 mmol/L   Potassium 3.7 3.5 - 5.1 mmol/L  Chloride 100 98 - 111 mmol/L   CO2 24 22 - 32 mmol/L   Glucose, Bld 198 (H) 70 - 99 mg/dL    Comment: Glucose reference range applies only to samples taken after fasting for at least 8 hours.   BUN 7 6 - 20 mg/dL   Creatinine, Ser 2.12 0.44 - 1.00 mg/dL   Calcium 9.1 8.9 - 24.8 mg/dL   Total Protein 6.4 (L) 6.5 - 8.1 g/dL   Albumin 3.6 3.5 - 5.0 g/dL   AST 16 15 - 41 U/L   ALT 13 0 - 44 U/L   Alkaline Phosphatase 60 38 - 126 U/L   Total Bilirubin 0.7 0.3 - 1.2 mg/dL   GFR, Estimated >25 >00 mL/min    Comment: (NOTE) Calculated using the CKD-EPI Creatinine Equation (2021)    Anion gap 10 5 - 15    Comment: Performed at [facility], [address], [place], [place] [address]  Ethanol     Status: None   Collection Time: 06/01/20  4:23 PM  Result Value Ref Range   Alcohol, Ethyl (B) <10 <10 mg/dL    Comment: (NOTE) Lowest detectable limit for serum alcohol is 10 mg/dL.  For medical purposes only. Performed at [facility], [address] [place], [place] [address]   Salicylate level     Status: Abnormal   Collection Time: 06/01/20  4:23 PM  Result Value Ref Range   Salicylate Lvl <7.0 (L) 7.0 - 30.0 mg/dL    Comment: Performed at [facility], [address] [place], [place] [address]  Acetaminophen level     Status: Abnormal   Collection Time: 06/01/20  4:23 PM  Result Value Ref Range   Acetaminophen (Tylenol), Serum <10 (L) 10 - 30 ug/mL    Comment: (NOTE) Therapeutic concentrations vary significantly. A range of 10-30 ug/mL  may be an effective concentration for many patients. However, some  are best treated at concentrations outside of this range. Acetaminophen concentrations >150 ug/mL at 4 hours after ingestion  and >50 ug/mL at 12 hours after ingestion are often associated with  toxic reactions.  Performed at [facility], [address] [place], [place] [address]   CBG monitoring, ED     Status: Abnormal   Collection Time: 06/02/20 10:01 AM  Result Value Ref Range   Glucose-Capillary 253 (H) 70 - 99 mg/dL    Comment: Glucose reference range applies only to samples taken after fasting for at least 8 hours.  Urine Drug Screen, Qualitative ([facility] only)     Status: None   Collection Time: 06/02/20 10:41 AM  Result Value Ref Range   Tricyclic, Ur Screen NONE DETECTED NONE DETECTED   Amphetamines, Ur Screen NONE DETECTED NONE DETECTED   MDMA (Ecstasy)Ur Screen NONE DETECTED NONE DETECTED   Cocaine Metabolite,Ur [place] NONE DETECTED NONE DETECTED   Opiate, Ur Screen NONE DETECTED NONE DETECTED   Phencyclidine (PCP) Ur S NONE DETECTED NONE DETECTED   Cannabinoid 50 Ng, Ur [place] NONE DETECTED NONE DETECTED   Barbiturates, Ur Screen NONE DETECTED NONE DETECTED   Benzodiazepine, Ur Scrn NONE DETECTED NONE DETECTED   Methadone Scn, Ur NONE DETECTED NONE DETECTED    Comment: (NOTE) Tricyclics + metabolites, urine    Cutoff 1000 ng/mL Amphetamines + metabolites, urine   Cutoff 1000 ng/mL MDMA (Ecstasy), urine              Cutoff 500 ng/mL Cocaine Metabolite, urine  Cutoff 300 ng/mL Opiate + metabolites, urine        Cutoff 300 ng/mL Phencyclidine (PCP), urine         Cutoff 25 ng/mL Cannabinoid, urine                 Cutoff 50 ng/mL Barbiturates + metabolites, urine  Cutoff 200 ng/mL Benzodiazepine, urine              Cutoff 200 ng/mL Methadone, urine                   Cutoff 300 ng/mL  The urine drug screen provides only a preliminary, unconfirmed analytical test result and should not be used for non-medical purposes. Clinical consideration and professional judgment should be applied to any positive drug screen result due to possible interfering substances. A more specific alternate chemical method must be used in order to obtain a confirmed analytical result. Gas chromatography / mass spectrometry (GC/MS) is the preferred confirm atory method. Performed at [facility], [address] [place], [place] [address]   Urinalysis, Complete w Microscopic     Status: Abnormal   Collection Time: 06/02/20 10:41 AM  Result Value Ref Range   Color, Urine YELLOW (A) YELLOW   APPearance CLEAR (A) CLEAR   Specific Gravity, Urine 1.013 1.005 - 1.030   pH 6.0 5.0 - 8.0   Glucose, UA NEGATIVE NEGATIVE mg/dL   Hgb urine dipstick NEGATIVE NEGATIVE   Bilirubin Urine NEGATIVE NEGATIVE   Ketones, ur NEGATIVE NEGATIVE mg/dL   Protein, ur NEGATIVE NEGATIVE mg/dL   Nitrite NEGATIVE NEGATIVE   Leukocytes,Ua SMALL (A) NEGATIVE   RBC / HPF 0-5 0 - 5 RBC/hpf   WBC, UA 0-5 0 - 5 WBC/hpf   Bacteria, UA NONE SEEN NONE SEEN   Squamous Epithelial / LPF 0-5 0 - 5   Mucus PRESENT     Comment: Performed at [facility], [address], [place], [place] [address]    Current Facility-Administered Medications  Medication Dose Route Frequency Provider Last Rate Last Admin  . ARIPiprazole (ABILIFY) tablet 10 mg  10 mg Oral Daily Chesley Noon, MD    10 mg at 06/02/20 1038  . ascorbic acid (VITAMIN C) tablet 500 mg  500 mg Oral BID Chesley Noon, MD   500 mg at 06/02/20 1044  . aspirin EC tablet 81 mg  81 mg Oral Daily Chesley Noon, MD   81 mg at 06/02/20 1038  . benztropine (COGENTIN) tablet 0.5 mg  0.5 mg Oral BID PRN Chesley Noon, MD      . buPROPion (WELLBUTRIN XL) 24 hr tablet 300 mg  300 mg Oral Daily Chesley Noon, MD   300 mg at 06/02/20 1038  . citalopram (CELEXA) tablet 40 mg  40 mg Oral Daily Chesley Noon, MD   40 mg at 06/02/20 1038  . divalproex (DEPAKOTE) DR tablet 1,250 mg  1,250 mg Oral QHS Chesley Noon, MD      . Melene Muller ON 06/03/2020] ferrous sulfate tablet 325 mg  325 mg Oral Q breakfast Chesley Noon, MD      . metFORMIN (GLUCOPHAGE) tablet 500 mg  500 mg Oral BID WC Chesley Noon, MD      . oxybutynin (DITROPAN) tablet 5 mg  5 mg Oral Daily Chesley Noon, MD   5 mg at 06/02/20 1037  . pravastatin (PRAVACHOL) tablet 20 mg  20 mg Oral QPM Chesley Noon, MD      . traZODone (DESYREL) tablet 150  mg  150 mg Oral QHS Chesley Noon, MD       Current Outpatient Medications  Medication Sig Dispense Refill  . ARIPiprazole (ABILIFY) 10 MG tablet Take 10 mg by mouth daily.    . ARIPiprazole ER (ABILIFY MAINTENA) 400 MG PRSY prefilled syringe Inject 400 mg into the muscle every 30 (thirty) days.    Marland Kitchen aspirin 81 MG EC tablet Take 81 mg by mouth daily. Swallow whole.    . benztropine (COGENTIN) 0.5 MG tablet Take 0.5 mg by mouth 2 (two) times daily as needed.    Marland Kitchen buPROPion (WELLBUTRIN XL) 300 MG 24 hr tablet Take 300 mg by mouth daily.    . citalopram (CELEXA) 40 MG tablet Take 40 mg by mouth daily.    . divalproex (DEPAKOTE) 250 MG DR tablet Take 1,250 mg by mouth at bedtime.    . ferrous sulfate 325 (65 FE) MG tablet Take 325 mg by mouth daily with breakfast.    . metFORMIN (GLUCOPHAGE) 500 MG tablet Take 500 mg by mouth 2 (two) times daily with a meal.    . oxybutynin (DITROPAN) 5 MG tablet Take 5 mg by  mouth daily.    . pravastatin (PRAVACHOL) 20 MG tablet Take 20 mg by mouth every evening.    . traZODone (DESYREL) 150 MG tablet Take 150 mg by mouth at bedtime.    . vitamin C (ASCORBIC ACID) 500 MG tablet Take 500 mg by mouth 2 (two) times daily.      Musculoskeletal: Strength & Muscle Tone: decreased Gait & Station: unsteady Patient leans: N/A            Psychiatric Specialty Exam:  Presentation  General Appearance: No data recorded Eye Contact:No data recorded Speech:No data recorded Speech Volume:No data recorded Handedness:No data recorded  Mood and Affect  Mood:No data recorded Affect:No data recorded  Thought Process  Thought Processes:No data recorded Descriptions of Associations:No data recorded Orientation:No data recorded Thought Content:No data recorded History of Schizophrenia/Schizoaffective disorder:Yes  Duration of Psychotic Symptoms:Less than six months  Hallucinations:No data recorded Ideas of Reference:No data recorded Suicidal Thoughts:No data recorded Homicidal Thoughts:No data recorded  Sensorium  Memory:No data recorded Judgment:No data recorded Insight:No data recorded  Executive Functions  Concentration:No data recorded Attention Span:No data recorded Recall:No data recorded Fund of Knowledge:No data recorded Language:No data recorded  Psychomotor Activity  Psychomotor Activity:No data recorded  Assets  Assets:No data recorded  Sleep  Sleep:No data recorded  Physical Exam: Physical Exam Vitals and nursing note reviewed.  Constitutional:      Appearance: Normal appearance.  HENT:     Head: Normocephalic and atraumatic.     Mouth/Throat:     Pharynx: Oropharynx is clear.  Eyes:     Pupils: Pupils are equal, round, and reactive to light.  Cardiovascular:     Rate and Rhythm: Normal rate and regular rhythm.  Pulmonary:     Effort: Pulmonary effort is normal.     Breath sounds: Normal breath sounds.  Abdominal:      General: Abdomen is flat.     Palpations: Abdomen is soft.  Musculoskeletal:        General: Normal range of motion.       Legs:  Skin:    General: Skin is warm and dry.  Neurological:     General: No focal deficit present.     Mental Status: She is alert. Mental status is at baseline.  Psychiatric:        Attention  and Perception: She is inattentive.        Mood and Affect: Mood normal. Affect is blunt.        Speech: She is noncommunicative.        Behavior: Behavior is agitated. Behavior is not aggressive or hyperactive.        Thought Content: Thought content normal. Thought content does not include homicidal or suicidal ideation.        Cognition and Memory: Cognition is impaired.        Judgment: Judgment is impulsive.    Review of Systems  Constitutional: Negative.   HENT: Negative.   Eyes: Negative.   Respiratory: Negative.   Cardiovascular: Negative.   Gastrointestinal: Negative.   Musculoskeletal: Negative.   Skin: Negative.   Neurological: Negative.   Psychiatric/Behavioral: Negative.    Blood pressure (!) 162/93, pulse (!) 102, temperature (!) 97.3 F (36.3 C), temperature source Oral, resp. rate 17, height 5' (1.524 m), weight 69.4 kg, SpO2 96 %. Body mass index is 29.88 kg/m.  Treatment Plan Summary: Plan 60 year old woman with schizophrenia.  Appears to have active psychotic symptoms but is not aggressive threatening or violent here.  Not suicidal.  Patient does not meet commitment criteria.  Has a safe place to live and outpatient psychiatric care in place.  Recommend that she be discharged back to her group home with follow-up outpatient care as usual.  Case reviewed with emergency room physician.  Disposition: Patient does not meet criteria for psychiatric inpatient admission. Supportive therapy provided about ongoing stressors. Discussed crisis plan, support from social network, calling 911, coming to the Emergency Department, and calling Suicide  Hotline.  Mordecai Rasmussen, MD 06/02/2020 1:18 PM

## 2020-06-02 NOTE — ED Notes (Signed)
RN attempt to call [facility] to update about disposition at [phone], no answer and unable to leave VM.

## 2020-06-02 NOTE — ED Notes (Signed)
Taken to [employer] via wheelchair with security and EDT.

## 2020-06-02 NOTE — ED Notes (Signed)
Dr. Larinda Buttery reviewed [facility] from group home and gave verbal order to restart home medications.

## 2020-06-02 NOTE — ED Notes (Addendum)
RN to draw blood and pt allowed RN to get labs then proceeded to yank arm away while PIV attempting to be flushed. Blood spattering all over patient, bed and in hallway. Labs collected and sent to lab. Attempted to provide new sheets to patient after taking old ones but pt refuses. EDP informed about pt irritability, agitation and uncooperation. Pt with loud outburts of yelling.

## 2020-06-02 NOTE — ED Provider Notes (Signed)
4:02 PM Assumed care for off going team.   Patient noted to have increasing tachycardia and some low-grade temperatures.  I added on a chest x-ray.  Her urine test was negative and COVID was negative.we will also add on a procalcitonin to see if is any signs of infection.  I suspect that some of his tachycardia and low-grade temps could be from her agitation but will continue to closely monitor  Patient had to be given 5 of Haldol and 2 of Ativan due to agitation.  However her procalcitonin did result as less than 0.10 therefore at this time I have very low suspicion for sepsis or bacteremia or meningitis as the cause.  Suspect that the temperature elevation and the tachycardia is from her agitation.  We will continue to closely monitor.        Concha Se, MD 06/02/20 2021

## 2020-06-02 NOTE — ED Notes (Signed)
IVC, psych consult complete, pend D/C back to Group Home

## 2020-06-02 NOTE — ED Notes (Signed)
Psych at bedside.

## 2020-06-02 NOTE — ED Notes (Signed)
SOC MD completed assessment and does not recommend inpatient at this time; inquiring about return to group home.

## 2020-06-02 NOTE — ED Notes (Signed)
Pt alert to self, situation and place. Cannot recall what year it is. Able to transition to wheelchair from bed to go to bathroom. Continent. Eating well. Took PO meds without difficulties.

## 2020-06-02 NOTE — ED Notes (Signed)
Pt spilled drink over herself. Given new pants and new sheets.

## 2020-06-02 NOTE — ED Provider Notes (Signed)
-----------------------------------------   2:46 PM on 06/02/2020 -----------------------------------------  Patient has been evaluated by psychiatry and plan was for discharge back to group home, however around the time of discharge she became increasingly agitated and aggressive, appears acutely psychotic.  She was placed under IVC and psychiatry will now seek admission.   Chesley Noon, MD 06/02/20 [phone]

## 2020-06-02 NOTE — ED Notes (Signed)
Lab unable to verify that pt urine sample was actually hers due to unreadable label. Questionable "hand sanitizer on it" per lab staff. Will have to recollect

## 2020-06-02 NOTE — ED Notes (Signed)
Called lab to inquire about status of urine as it has not been received but collected and sent via tube system by this RN.

## 2020-06-02 NOTE — ED Notes (Signed)
Pt in interview room with Shawna Orleans, EDT for [facility] consult.

## 2020-06-02 NOTE — ED Notes (Signed)
Patient sleeping due to medications given earlier. Will continue to monitor.

## 2020-06-02 NOTE — ED Notes (Signed)
Pt given breakfast.

## 2020-06-02 NOTE — ED Notes (Signed)
Pt with sudden vocal outburst regarding her lunch tray. States that we took her food. Pt has empty lunch tray beside her that she ate all of. Provided emotional support.

## 2020-06-02 NOTE — ED Notes (Signed)
Lab able to add pro calcitonin onto previous labs.

## 2020-06-03 DIAGNOSIS — F203 Undifferentiated schizophrenia: Secondary | ICD-10-CM | POA: Diagnosis not present

## 2020-06-03 LAB — CBG MONITORING, ED
Glucose-Capillary: 192 mg/dL — ABNORMAL HIGH (ref 70–99)
Glucose-Capillary: 96 mg/dL (ref 70–99)
Glucose-Capillary: 119 mg/dL — ABNORMAL HIGH (ref 70–99)
Glucose-Capillary: 156 mg/dL — ABNORMAL HIGH (ref 70–99)

## 2020-06-03 MED ORDER — BUPROPION HCL ER (XL) 150 MG PO TB24
150.0000 mg | ORAL_TABLET | Freq: Every day | ORAL | Status: DC
  Administered 2020-06-04: 150 mg via ORAL
  Filled 2020-06-03: qty 1

## 2020-06-03 NOTE — ED Notes (Signed)
This RN contacted Gene, group home owner, and informed of patient pending discharge. Gene informed this RN that she would look for transportation and call this RN back.

## 2020-06-03 NOTE — Progress Notes (Signed)
Was contacted by Tora Perches with concerns about patient's discharge plans. Contacted ED Cn Herbert Seta, asked she contact Mr Effie Shy. She stated they had talked with him, but she would call him.

## 2020-06-03 NOTE — ED Notes (Signed)
VOL/ Pending D/C vs Placement

## 2020-06-03 NOTE — Consult Note (Signed)
[facility] Face-to-Face Psychiatry Consult   Reason for Consult: Follow-up consult 60 year old woman with schizophrenia. Referring Physician: Fuller Plan Patient Identification: Teresa Murillo MRN:  [id number] Principal Diagnosis: Schizophrenia [facility]) Diagnosis:  Principal Problem:   Schizophrenia (HCC) Active Problems:   COPD (chronic obstructive pulmonary disease) (HCC)   Broken ankle   Total Time spent with patient: 30 minutes  Subjective:   Teresa Murillo is a 60 y.o. female patient admitted with "I guess I am okay".  HPI: Patient seen for follow-up.  In short, yesterday we had initially planned to recommend she go back to her group home but around the time that was being done she became acutely agitated displaying paranoia and angry behavior and so was kept in the emergency room.  Patient is continued on her psychiatric medicine.  Today on reevaluation mood is much better.  She has not expressed any anger or had behavior problems today.  She talks about her teeth hurting and looking forward to having dental work.  She does not appear to be responding to internal stimuli.  Denies suicidal or homicidal ideation.  Expresses acceptance at the idea of going back to the group home.  Not having any side effects from medicine.  Past Psychiatric History: History of schizophrenia with positive past history of hospitalizations.  Risk to Self:   Risk to Others:   Prior Inpatient Therapy:   Prior Outpatient Therapy:    Past Medical History:  Past Medical History:  Diagnosis Date  . Chronic obstructive pulmonary disease (COPD) (HCC)   . Glaucoma   . Hyperlipidemia   . Hypertension   . Iron deficiency   . Schizoaffective disorder (HCC)   . Type 2 diabetes mellitus (HCC)     Past Surgical History:  Procedure Laterality Date  . TUBAL LIGATION     Family History:  Family History  Problem Relation Age of Onset  . Breast cancer Maternal Grandmother    Family Psychiatric  History: See  previous Social History:  Social History   Substance and Sexual Activity  Alcohol Use Yes     Social History   Substance and Sexual Activity  Drug Use Not Currently    Social History   Socioeconomic History  . Marital status: Single    Spouse name: Not on file  . Number of children: Not on file  . Years of education: Not on file  . Highest education level: Not on file  Occupational History  . Not on file  Tobacco Use  . Smoking status: Current Every Day Smoker    Packs/day: 1.00    Types: Cigars  . Smokeless tobacco: Current User  Vaping Use  . Vaping Use: Never used  Substance and Sexual Activity  . Alcohol use: Yes  . Drug use: Not Currently  . Sexual activity: Not Currently    Birth control/protection: Surgical  Other Topics Concern  . Not on file  Social History Narrative  . Not on file   Social Determinants of Health   Financial Resource Strain: Not on file  Food Insecurity: Not on file  Transportation Needs: Not on file  Physical Activity: Not on file  Stress: Not on file  Social Connections: Not on file   Additional Social History:    Allergies:   Allergies  Allergen Reactions  . Penicillins     Labs:  Results for orders placed or performed during the hospital encounter of 06/01/20 (from the past 48 hour(s))  CBG monitoring, ED     Status: Abnormal  Collection Time: 06/02/20 10:01 AM  Result Value Ref Range   Glucose-Capillary 253 (H) 70 - 99 mg/dL    Comment: Glucose reference range applies only to samples taken after fasting for at least 8 hours.  Urine Drug Screen, Qualitative ([facility] only)     Status: None   Collection Time: 06/02/20 10:41 AM  Result Value Ref Range   Tricyclic, Ur Screen NONE DETECTED NONE DETECTED   Amphetamines, Ur Screen NONE DETECTED NONE DETECTED   MDMA (Ecstasy)Ur Screen NONE DETECTED NONE DETECTED   Cocaine Metabolite,Ur [place] NONE DETECTED NONE DETECTED   Opiate, Ur Screen NONE DETECTED NONE DETECTED    Phencyclidine (PCP) Ur S NONE DETECTED NONE DETECTED   Cannabinoid 50 Ng, Ur Ingenio NONE DETECTED NONE DETECTED   Barbiturates, Ur Screen NONE DETECTED NONE DETECTED   Benzodiazepine, Ur Scrn NONE DETECTED NONE DETECTED   Methadone Scn, Ur NONE DETECTED NONE DETECTED    Comment: (NOTE) Tricyclics + metabolites, urine    Cutoff 1000 ng/mL Amphetamines + metabolites, urine  Cutoff 1000 ng/mL MDMA (Ecstasy), urine              Cutoff 500 ng/mL Cocaine Metabolite, urine          Cutoff 300 ng/mL Opiate + metabolites, urine        Cutoff 300 ng/mL Phencyclidine (PCP), urine         Cutoff 25 ng/mL Cannabinoid, urine                 Cutoff 50 ng/mL Barbiturates + metabolites, urine  Cutoff 200 ng/mL Benzodiazepine, urine              Cutoff 200 ng/mL Methadone, urine                   Cutoff 300 ng/mL  The urine drug screen provides only a preliminary, unconfirmed analytical test result and should not be used for non-medical purposes. Clinical consideration and professional judgment should be applied to any positive drug screen result due to possible interfering substances. A more specific alternate chemical method must be used in order to obtain a confirmed analytical result. Gas chromatography / mass spectrometry (GC/MS) is the preferred confirm atory method. Performed at [facility], [address] [place], [place] [address]   Urinalysis, Complete w Microscopic     Status: Abnormal   Collection Time: 06/02/20 10:41 AM  Result Value Ref Range   Color, Urine YELLOW (A) YELLOW   APPearance CLEAR (A) CLEAR   Specific Gravity, Urine 1.013 1.005 - 1.030   pH 6.0 5.0 - 8.0   Glucose, UA NEGATIVE NEGATIVE mg/dL   Hgb urine dipstick NEGATIVE NEGATIVE   Bilirubin Urine NEGATIVE NEGATIVE   Ketones, ur NEGATIVE NEGATIVE mg/dL   Protein, ur NEGATIVE NEGATIVE mg/dL   Nitrite NEGATIVE NEGATIVE   Leukocytes,Ua SMALL (A) NEGATIVE   RBC / HPF 0-5 0 - 5 RBC/hpf   WBC, UA 0-5 0 - 5  WBC/hpf   Bacteria, UA NONE SEEN NONE SEEN   Squamous Epithelial / LPF 0-5 0 - 5   Mucus PRESENT     Comment: Performed at [facility], [address] [place], [place] [address]  CBG monitoring, ED     Status: Abnormal   Collection Time: 06/02/20  4:12 PM  Result Value Ref Range   Glucose-Capillary 116 (H) 70 - 99 mg/dL    Comment: Glucose reference range applies only to samples taken after fasting for at least 8  hours.  Procalcitonin - Baseline     Status: None   Collection Time: 06/02/20  5:31 PM  Result Value Ref Range   Procalcitonin <0.10 ng/mL    Comment:        Interpretation: PCT (Procalcitonin) <= 0.5 ng/mL: Systemic infection (sepsis) is not likely. Local bacterial infection is possible. (NOTE)       Sepsis PCT Algorithm           Lower Respiratory Tract                                      Infection PCT Algorithm    ----------------------------     ----------------------------         PCT < 0.25 ng/mL                PCT < 0.10 ng/mL          Strongly encourage             Strongly discourage   discontinuation of antibiotics    initiation of antibiotics    ----------------------------     -----------------------------       PCT 0.25 - 0.50 ng/mL            PCT 0.10 - 0.25 ng/mL               OR       >80% decrease in PCT            Discourage initiation of                                            antibiotics      Encourage discontinuation           of antibiotics    ----------------------------     -----------------------------         PCT >= 0.50 ng/mL              PCT 0.26 - 0.50 ng/mL               AND        <80% decrease in PCT             Encourage initiation of                                             antibiotics       Encourage continuation           of antibiotics    ----------------------------     -----------------------------        PCT >= 0.50 ng/mL                  PCT > 0.50 ng/mL               AND         increase in PCT                   Strongly encourage  initiation of antibiotics    Strongly encourage escalation           of antibiotics                                     -----------------------------                                           PCT <= 0.25 ng/mL                                                 OR                                        > 80% decrease in PCT                                      Discontinue / Do not initiate                                             antibiotics  Performed at [facility], [address] [place], [place] [address]   CBG monitoring, ED     Status: Abnormal   Collection Time: 06/02/20  9:15 PM  Result Value Ref Range   Glucose-Capillary 166 (H) 70 - 99 mg/dL    Comment: Glucose reference range applies only to samples taken after fasting for at least 8 hours.  CBG monitoring, ED     Status: Abnormal   Collection Time: 06/03/20  9:29 AM  Result Value Ref Range   Glucose-Capillary 192 (H) 70 - 99 mg/dL    Comment: Glucose reference range applies only to samples taken after fasting for at least 8 hours.  CBG monitoring, ED     Status: None   Collection Time: 06/03/20 12:05 PM  Result Value Ref Range   Glucose-Capillary 96 70 - 99 mg/dL    Comment: Glucose reference range applies only to samples taken after fasting for at least 8 hours.  CBG monitoring, ED     Status: Abnormal   Collection Time: 06/03/20  4:09 PM  Result Value Ref Range   Glucose-Capillary 119 (H) 70 - 99 mg/dL    Comment: Glucose reference range applies only to samples taken after fasting for at least 8 hours.    Current Facility-Administered Medications  Medication Dose Route Frequency Provider Last Rate Last Admin  . ARIPiprazole (ABILIFY) tablet 10 mg  10 mg Oral Daily Chesley Noon, MD   10 mg at 06/03/20 1034  . ascorbic acid (VITAMIN C) tablet 500 mg  500 mg Oral BID Chesley Noon, MD   500 mg at 06/03/20 1034  . aspirin EC tablet 81 mg   81 mg Oral Daily Chesley Noon, MD   81 mg at 06/03/20 1034  . benztropine (COGENTIN) tablet 0.5 mg  0.5 mg Oral BID PRN Chesley Noon,  MD      . Melene Muller[START ON 06/04/2020] buPROPion (WELLBUTRIN XL) 24 hr tablet 150 mg  150 mg Oral Daily Firman Petrow T, MD      . citalopram (CELEXA) tablet 40 mg  40 mg Oral Daily Chesley NoonJessup, Charles, MD   40 mg at 06/03/20 1033  . divalproex (DEPAKOTE) DR tablet 1,250 mg  1,250 mg Oral QHS Chesley NoonJessup, Charles, MD   1,250 mg at 06/03/20 0013  . ferrous sulfate tablet 325 mg  325 mg Oral Q breakfast Chesley NoonJessup, Charles, MD   325 mg at 06/03/20 1034  . insulin aspart (novoLOG) injection 0-5 Units  0-5 Units Subcutaneous QHS Concha SeFunke, Mary E, MD      . insulin aspart (novoLOG) injection 0-9 Units  0-9 Units Subcutaneous TID WC Concha SeFunke, Mary E, MD   2 Units at 06/03/20 1036  . metFORMIN (GLUCOPHAGE) tablet 500 mg  500 mg Oral BID WC Chesley NoonJessup, Charles, MD   500 mg at 06/03/20 1034  . oxybutynin (DITROPAN) tablet 5 mg  5 mg Oral Daily Chesley NoonJessup, Charles, MD   5 mg at 06/03/20 1034  . pravastatin (PRAVACHOL) tablet 20 mg  20 mg Oral QPM Chesley NoonJessup, Charles, MD   20 mg at 06/02/20 1710  . traZODone (DESYREL) tablet 150 mg  150 mg Oral QHS Chesley NoonJessup, Charles, MD   150 mg at 06/03/20 0013   Current Outpatient Medications  Medication Sig Dispense Refill  . ARIPiprazole (ABILIFY) 10 MG tablet Take 10 mg by mouth daily.    . ARIPiprazole ER (ABILIFY MAINTENA) 400 MG PRSY prefilled syringe Inject 400 mg into the muscle every 30 (thirty) days.    Marland Kitchen. aspirin 81 MG EC tablet Take 81 mg by mouth daily. Swallow whole.    . benztropine (COGENTIN) 0.5 MG tablet Take 0.5 mg by mouth 2 (two) times daily as needed.    Marland Kitchen. buPROPion (WELLBUTRIN XL) 300 MG 24 hr tablet Take 300 mg by mouth daily.    . citalopram (CELEXA) 40 MG tablet Take 40 mg by mouth daily.    . divalproex (DEPAKOTE) 250 MG DR tablet Take 1,250 mg by mouth at bedtime.    . ferrous sulfate 325 (65 FE) MG tablet Take 325 mg by mouth daily with  breakfast.    . metFORMIN (GLUCOPHAGE) 500 MG tablet Take 500 mg by mouth 2 (two) times daily with a meal.    . oxybutynin (DITROPAN) 5 MG tablet Take 5 mg by mouth daily.    . pravastatin (PRAVACHOL) 20 MG tablet Take 20 mg by mouth every evening.    . traZODone (DESYREL) 150 MG tablet Take 150 mg by mouth at bedtime.    . vitamin C (ASCORBIC ACID) 500 MG tablet Take 500 mg by mouth 2 (two) times daily.      Musculoskeletal: Strength & Muscle Tone: within normal limits Gait & Station: Patient has a cast on her leg and has some difficulty ambulating Patient leans: N/A            Psychiatric Specialty Exam:  Presentation  General Appearance: No data recorded Eye Contact:No data recorded Speech:No data recorded Speech Volume:No data recorded Handedness:No data recorded  Mood and Affect  Mood:No data recorded Affect:No data recorded  Thought Process  Thought Processes:No data recorded Descriptions of Associations:No data recorded Orientation:No data recorded Thought Content:No data recorded History of Schizophrenia/Schizoaffective disorder:Yes  Duration of Psychotic Symptoms:Less than six months  Hallucinations:No data recorded Ideas of Reference:No data recorded Suicidal Thoughts:No data recorded Homicidal Thoughts:No  data recorded  Sensorium  Memory:No data recorded Judgment:No data recorded Insight:No data recorded  Executive Functions  Concentration:No data recorded Attention Span:No data recorded Recall:No data recorded Fund of Knowledge:No data recorded Language:No data recorded  Psychomotor Activity  Psychomotor Activity:No data recorded  Assets  Assets:No data recorded  Sleep  Sleep:No data recorded  Physical Exam: Physical Exam Vitals and nursing note reviewed.  Constitutional:      Appearance: Normal appearance.  HENT:     Head: Normocephalic and atraumatic.     Mouth/Throat:     Pharynx: Oropharynx is clear.  Eyes:     Pupils:  Pupils are equal, round, and reactive to light.  Cardiovascular:     Rate and Rhythm: Normal rate and regular rhythm.  Pulmonary:     Effort: Pulmonary effort is normal.     Breath sounds: Normal breath sounds.  Abdominal:     General: Abdomen is flat.     Palpations: Abdomen is soft.  Musculoskeletal:        General: Normal range of motion.  Skin:    General: Skin is warm and dry.  Neurological:     General: No focal deficit present.     Mental Status: She is alert. Mental status is at baseline.  Psychiatric:        Attention and Perception: Attention normal.        Mood and Affect: Mood normal. Affect is blunt.        Speech: Speech is delayed.        Behavior: Behavior is slowed.        Thought Content: Thought content normal. Thought content is not paranoid. Thought content does not include homicidal or suicidal ideation.        Cognition and Memory: Cognition is impaired.    Review of Systems  Constitutional: Negative.   HENT: Negative.   Eyes: Negative.   Respiratory: Negative.   Cardiovascular: Negative.   Gastrointestinal: Negative.   Musculoskeletal: Negative.   Skin: Negative.   Neurological: Negative.   Psychiatric/Behavioral: Negative.    Blood pressure (!) 130/99, pulse (!) 108, temperature 99 F (37.2 C), temperature source Oral, resp. rate 17, height 5' (1.524 m), weight 69.4 kg, SpO2 95 %. Body mass index is 29.88 kg/m.  Treatment Plan Summary: Plan Seems to be doing much better today.  Could be she had missed some medication or it could be that having her blood sugar be high yesterday was agitating to her.  In any case she seems much better today and I am now recommending that rather than try for hospitalization we aim for discharge back to her group home.  Patient is aware of the plan.  Reviewed with nursing and emergency room doctor.  Discontinued IVC.  Disposition: No evidence of imminent risk to self or others at present.   Patient does not meet  criteria for psychiatric inpatient admission. Supportive therapy provided about ongoing stressors. Discussed crisis plan, support from social network, calling 911, coming to the Emergency Department, and calling Suicide Hotline.  Mordecai Rasmussen, MD 06/03/2020 4:53 PM

## 2020-06-03 NOTE — ED Provider Notes (Signed)
Emergency Medicine Observation Re-evaluation Note  Teresa Murillo is a 60 y.o. female, seen on rounds today.  Pt initially presented to the ED for complaints of Aggressive Behavior Currently, the patient is resting.  Physical Exam  BP (!) 147/88 (BP Location: Left Arm)   Pulse (!) 120   Temp 100.2 F (37.9 C) (Oral)   Resp 18   Ht 1.524 m (5')   Wt 69.4 kg   SpO2 95%   BMI 29.88 kg/m  Physical Exam Gen:  No acute distress Resp:  Breathing easily and comfortably, no accessory muscle usage Neuro:  Moving all four extremities, no gross focal neuro deficits Psych:  Resting currently, calm when awake  ED Course / MDM  EKG:   I have reviewed the labs performed to date as well as medications administered while in observation.  Recent changes in the last 24 hours include an episode where she required sedation after getting extremely worked up.  She had a low-grade temperature but had an extensive reassessment and work-up and there is no evidence of any acute infection.  Plan  Current plan is for discharge back to group home pending any additional psychiatric recommendations. Patient is under full IVC at this time.   Loleta Rose, MD 06/03/20 [phone]

## 2020-06-03 NOTE — ED Notes (Signed)
This RN spoke with "Genevie Cheshire" from group home at number [phone]. This RN informed Genevie Cheshire that patient was up for discharge. Genevie Cheshire became verbally aggressive with this RN and began cussing on phone stating "no the hell we are not taking this patient back" and would not let this RN speak much. This RN informed Genevie Cheshire that to my understanding patients had to be given a 30 day notice before being kicked out of group home, but that I would get our social work team to contact him about specifics, as this RN was not completely familiar with legal policy. Genevie Cheshire became irate on phone with this RN stating "No, you transfer me to greg right now. I'm filing a report with board of nursing for you unsafely discharging the patient". This RN asked who greg was, Biochemist, clinical". This RN informed Engineer, technical sales was not in office after hours, but that he could call main hospital line and ask for our charge nurse. Genevie Cheshire then states 'Why the hell cant you transfer me to her yourself?". This RN informed Genevie Cheshire that this RN was on ascom and that is not possible to transfer to another phone from these phones. Billy then states "whatever, I'm tired of yall and reporting yalls asses" and proceeded to hang up on this RN. This RN informed MD Fuller Plan and Lennie Hummer, Child psychotherapist, of conversation.

## 2020-06-03 NOTE — ED Notes (Signed)
IVC/pending discharge to group home.

## 2020-06-03 NOTE — ED Notes (Signed)
Pt assisted to bathroom in wheelchair at this time. Pt tolerated well.

## 2020-06-03 NOTE — ED Notes (Signed)
Pt assisted back into ED23AA at this time. Tolerated well.

## 2020-06-03 NOTE — ED Notes (Signed)
Papers rescinded, pending D/C 

## 2020-06-04 LAB — CBG MONITORING, ED
Glucose-Capillary: 118 mg/dL — ABNORMAL HIGH (ref 70–99)
Glucose-Capillary: 100 mg/dL — ABNORMAL HIGH (ref 70–99)

## 2020-06-04 NOTE — ED Notes (Signed)
Diet ginger ale given to patient at this

## 2020-06-04 NOTE — TOC Progression Note (Signed)
Transition of Care [facility]) - Progression Note    Patient Details  Name: Catherine Cubero MRN: [id number] Date of Birth: 10/14/1960  Transition [facility]) CM/SW Contact  Marina Goodell Phone Number: [phone] 06/04/2020, 12:52 PM  Clinical Narrative:            Expected Discharge Plan and Services                                                 Social Determinants of Health (SDOH) Interventions    Readmission Risk Interventions No flowsheet data found.

## 2020-06-04 NOTE — ED Notes (Signed)
Birder Robson, owner of the group home, contacted ED and stated that she will be by to visit patient around noon or shortly after to assess patient to see if she is ok to come back to group home. Her concern was patient's aggressive behaviors while there.

## 2020-06-18 ENCOUNTER — Other Ambulatory Visit: Payer: Self-pay

## 2020-07-01 ENCOUNTER — Encounter: Payer: Self-pay | Admitting: *Deleted

## 2020-07-05 ENCOUNTER — Other Ambulatory Visit: Payer: Self-pay

## 2020-07-05 ENCOUNTER — Ambulatory Visit (HOSPITAL_COMMUNITY)
Admission: EM | Admit: 2020-07-05 | Discharge: 2020-07-06 | Disposition: A | Discharge status: Home / Self Care | Payer: No Typology Code available for payment source | Attending: Family | Admitting: Family

## 2020-07-05 DIAGNOSIS — Z20822 Contact with and (suspected) exposure to covid-19: Secondary | ICD-10-CM | POA: Insufficient documentation

## 2020-07-05 DIAGNOSIS — R4689 Other symptoms and signs involving appearance and behavior: Secondary | ICD-10-CM | POA: Diagnosis not present

## 2020-07-05 DIAGNOSIS — R443 Hallucinations, unspecified: Secondary | ICD-10-CM | POA: Diagnosis not present

## 2020-07-05 LAB — CBC
HCT: 43.2 % (ref 36.0–46.0)
Hemoglobin: 14.3 g/dL (ref 12.0–15.0)
MCH: 29.7 pg (ref 26.0–34.0)
MCHC: 33.1 g/dL (ref 30.0–36.0)
MCV: 89.8 fL (ref 80.0–100.0)
Platelets: 332 10*3/uL (ref 150–400)
RBC: 4.81 MIL/uL (ref 3.87–5.11)
RDW: 14.3 % (ref 11.5–15.5)
WBC: 7.1 10*3/uL (ref 4.0–10.5)
nRBC: 0 % (ref 0.0–0.2)

## 2020-07-05 LAB — COMPREHENSIVE METABOLIC PANEL
ALT: 13 U/L (ref 0–44)
AST: 15 U/L (ref 15–41)
Albumin: 3.9 g/dL (ref 3.5–5.0)
Alkaline Phosphatase: 63 U/L (ref 38–126)
Anion gap: 9 (ref 5–15)
BUN: 7 mg/dL (ref 6–20)
CO2: 27 mmol/L (ref 22–32)
Calcium: 9.1 mg/dL (ref 8.9–10.3)
Chloride: 98 mmol/L (ref 98–111)
Creatinine, Ser: 0.5 mg/dL (ref 0.44–1.00)
GFR, Estimated: >60 mL/min (ref >60)
Glucose, Bld: 142 mg/dL — ABNORMAL HIGH (ref 70–99)
Potassium: 3.8 mmol/L (ref 3.5–5.1)
Sodium: 134 mmol/L — ABNORMAL LOW (ref 135–145)
Total Bilirubin: 0.6 mg/dL (ref 0.3–1.2)
Total Protein: 7.3 g/dL (ref 6.5–8.1)

## 2020-07-05 LAB — RESP PANEL BY RT-PCR (FLU A&B, COVID) ARPGX2
Influenza A by PCR: NEGATIVE
Influenza B by PCR: NEGATIVE
SARS Coronavirus 2 by RT PCR: NEGATIVE

## 2020-07-05 LAB — POC SARS CORONAVIRUS 2 AG: SARSCOV2ONAVIRUS 2 AG: NEGATIVE

## 2020-07-05 LAB — POC SARS CORONAVIRUS 2 AG -  ED: SARS Coronavirus 2 Ag: NEGATIVE

## 2020-07-05 MED ORDER — ARIPIPRAZOLE 10 MG PO TABS
10.0000 mg | ORAL_TABLET | Freq: Once | ORAL | Status: AC
  Administered 2020-07-05: 10 mg via ORAL
  Filled 2020-07-05: qty 1

## 2020-07-05 MED ORDER — ARIPIPRAZOLE ER 400 MG IM SRER
400.0000 mg | Freq: Once | INTRAMUSCULAR | Status: AC
  Administered 2020-07-06: 400 mg via INTRAMUSCULAR
  Filled 2020-07-05: qty 2

## 2020-07-05 MED ORDER — TRAZODONE HCL 150 MG PO TABS
150.0000 mg | ORAL_TABLET | Freq: Every evening | ORAL | Status: DC | PRN
  Administered 2020-07-05: 150 mg via ORAL
  Filled 2020-07-05: qty 1

## 2020-07-05 MED ORDER — MAGNESIUM HYDROXIDE 400 MG/5ML PO SUSP: 30.0000 mL | Freq: Every day | ORAL | Status: DC | PRN

## 2020-07-05 MED ORDER — OXYBUTYNIN CHLORIDE 5 MG PO TABS
5.0000 mg | ORAL_TABLET | Freq: Once | ORAL | Status: AC
  Administered 2020-07-05: 5 mg via ORAL
  Filled 2020-07-05: qty 1

## 2020-07-05 MED ORDER — BUPROPION HCL ER (XL) 300 MG PO TB24
300.0000 mg | ORAL_TABLET | Freq: Once | ORAL | Status: AC
  Administered 2020-07-05: 300 mg via ORAL
  Filled 2020-07-05: qty 1

## 2020-07-05 MED ORDER — ACETAMINOPHEN 325 MG PO TABS
650.0000 mg | ORAL_TABLET | Freq: Four times a day (QID) | ORAL | Status: DC | PRN
Start: 2020-07-05 — End: 2020-07-06

## 2020-07-05 MED ORDER — PRAVASTATIN SODIUM 10 MG PO TABS
20.0000 mg | ORAL_TABLET | Freq: Once | ORAL | Status: AC
  Administered 2020-07-05: 20 mg via ORAL
  Filled 2020-07-05: qty 2

## 2020-07-05 MED ORDER — ALUM & MAG HYDROXIDE-SIMETH 200-200-20 MG/5ML PO SUSP: 30.0000 mL | ORAL | Status: DC | PRN

## 2020-07-05 MED ORDER — METFORMIN HCL 500 MG PO TABS
500.0000 mg | ORAL_TABLET | Freq: Two times a day (BID) | ORAL | Status: DC
  Administered 2020-07-05 – 2020-07-06 (×2): 500 mg via ORAL
  Filled 2020-07-05 (×2): qty 1

## 2020-07-05 MED ORDER — DIVALPROEX SODIUM 500 MG PO DR TAB
1250.0000 mg | DELAYED_RELEASE_TABLET | Freq: Once | ORAL | Status: AC
  Administered 2020-07-05: 1250 mg via ORAL
  Filled 2020-07-05: qty 1

## 2020-07-05 NOTE — ED Notes (Signed)
GIVEN DINNER  °

## 2020-07-05 NOTE — ED Notes (Signed)
Patient is resting comfortably. 

## 2020-07-05 NOTE — ED Notes (Signed)
Pt asked if she needed ortho boot to walk. "Without this boot I will fall flat on my face. The doctor said I needed this boot.My leg was broken and it still hurts" We removed boot and told pt if she needed to use the bathroom she will need to ask for the boot and we will monitor her while she uses it as it poses a risk to other pts and staff on the unit. Cecilio Asper, NP made aware that pt needs boot to walk however it poses a serious safety risk to the patients and staff to continue to allow her to have it on the unit and didn't give any further recommendations at this time. Will continue to monitor for safety.

## 2020-07-05 NOTE — ED Notes (Signed)
Pt admitted to overnight observation due to aggressive behaviors at group home. Denies SI/AVH. Cooperative throughout assessment. Pt wearing a boot to left foot. Belongings secured in locker #9. Will monitor for safety.

## 2020-07-05 NOTE — ED Notes (Signed)
Pt woke up in a state of confusion saying: "I thought I was upstairs at the home, where am I? [facility] [place]. Is there a bathroom here." Pt directed to bathroom.
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
## 2020-07-05 NOTE — BH Assessment (Signed)
Patient has not had her Abilify Injection which the group home where she lives states that they feel might be contributing to her aggression.  Patient normally takes 400 mg q28days
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
## 2020-07-05 NOTE — Discharge Instructions (Signed)
Take all medications as prescribed. Keep all follow-up appointments as scheduled.  Do not consume alcohol or use illegal drugs while on prescription medications. Report any adverse effects from your medications to your primary care provider promptly.  In the event of recurrent symptoms or worsening symptoms, call 911, a crisis hotline, or go to the nearest emergency department for evaluation.   
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
## 2020-07-06 LAB — VALPROIC ACID LEVEL: Valproic Acid Lvl: 93 ug/mL (ref 50.0–100.0)

## 2020-07-06 MED ORDER — HYDROXYZINE HCL 25 MG PO TABS
25.0000 mg | ORAL_TABLET | Freq: Once | ORAL | Status: AC
  Administered 2020-07-06: 25 mg via ORAL
  Filled 2020-07-06: qty 1
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
## 2020-07-06 NOTE — Progress Notes (Signed)
CSW received a call back from Calvin Taul/Phone#: [phone], who advised that staff will be able to pick up the patient at 1:00pm. Ms. Charlene Brooke requested if the patient prescriptions could be sent to the patient's pharmacy [facility] located in [place].   Crissie Reese, MSW, LCSW-A, LCAS-A Phone: [phone] Disposition/TOC

## 2020-07-06 NOTE — ED Notes (Signed)
Patient resting on bed.  Appears to be sleeping. 

## 2020-07-06 NOTE — ED Notes (Signed)
Pt stated "come sit next to me, I want to talk to you a while. It hurts a little bit to breathe, this happens often." I asked if pt wanted water and she said yes. Pt also requested a washcloth to "wipe off" and ambulated to bathroom.

## 2020-07-06 NOTE — ED Notes (Signed)
Patient sitting at bedside, adjusting boot.  Asking about coffee and breakfast.

## 2020-07-06 NOTE — Progress Notes (Signed)
This Clinical research associate called patient's group home owner Reola Calkins [phone] to inquire about patient's orthopaedic boot. Ms. [name] reports that patient has been ambulating in the group home without the boot and that patient wears her orthopaedic boot 'once in a while." Ms [name] report that she is unsure if patient has had follow up appt with ortho because patient is new to her group home. Per discharge instruction from [facility] Pt will likely convert to boot in outpatient setting in 6-8 weeks around mid April- early May).    Nursing staff instructed to remove boot due to safety concerns and to provide boot to patient as needed for ambulation. Patient is currently sleeping and is expected to discharge tomorrow after Abilify injection on 07/06/2020.

## 2020-07-06 NOTE — ED Notes (Addendum)
Pt up at nurses' station talking to staff. Pt speech is soft and mumbled with flight of ideas. Pt then stops talking and walks back to her bed in flex. No signs of acute distress noted. Will continue to monitor for safety.

## 2020-07-06 NOTE — ED Notes (Signed)
Pt awake, pacing back and forth from flex to adult obs and then back. Ortho boot on pt. No new issues at this time. Will continue to monitor for safety

## 2020-07-06 NOTE — ED Notes (Signed)
Pt continues to walk back and forth between flex and adult observation units. Cecilio Asper, NP notified of pt's behaviors and will review medications. Will continue to monitor for safety.

## 2020-07-06 NOTE — ED Notes (Signed)
Patient continues to walk around on unit.  Stated that she can't sleep.  Encouraged to rest on bed and elevate leg.  Patient difficult to understand.  Refusing to remove boot.  Discussed with provider.  Will continue to monitor.

## 2020-07-06 NOTE — Progress Notes (Signed)
CSW contacted [facility] in reference to request to facilitate transportation for the patient upon discharge. CSW was informed that the patient does not live at the location anymore and currently lives at [address] [place], [place] [address]. CSW was informed the point of contact is Clorissa Taul/Phone#: [phone]. CSW contacted Ms. Taul who reported she was informed that [employer] would transport the patient back home upon discharge. CSW contacted [facility] non-emergency to verify. It was reported that [employer] would not be able to transport the patient back to the residence she was transported from. CSW communicated findings with Ms. Haul who advised she currently only have 1 staff member at the ALF and that person would not be able to leave the patient at the ALF. It was reported that a second staff member comes on at 5:00pm and she will attempt to facilitate the staff member to come pick, the patient, up to transport back to the residence.   Crissie Reese, MSW, LCSW-A, LCAS-A Phone: [phone] Disposition/TOC

## 2020-07-06 NOTE — ED Notes (Signed)
Ambulated per self to retrieve belongings. No s/s pain, discomfort, or acute distress noted. No c/o SI, HI, or AVH. Educated on d/c instructions and medications. Verbalized understanding. Escorted to front lobby and d/c into care of [facility] staff. Stable at time of d/c

## 2020-07-06 NOTE — ED Notes (Signed)
Pt resting with eyes closed. No s/s of pain. Will continue to monitor for safety.  

## 2020-07-06 NOTE — ED Notes (Signed)
IM inj administered per orders in left deltoid. Pt tolerated well. No issues noted.

## 2020-07-06 NOTE — ED Notes (Signed)
Placed patients dentures in drawer in med room.

## 2020-07-06 NOTE — ED Notes (Signed)
Patient up to bathroom and then back to bed.  Patient stated that we weren't going to take the boot from her again.  Patient raised voice and said said "Don't be so greedy."  Patient then stated that it wasn't like eating a steak dinner every night.  Patient allowed to keep boot at this time.  Staff present on flex side and monitoring patient for safety.

## 2020-10-20 ENCOUNTER — Emergency Department (HOSPITAL_COMMUNITY)
Admission: EM | Admit: 2020-10-20 | Discharge: 2020-10-21 | Disposition: A | Discharge status: Home / Self Care | Payer: Medicaid Other | Attending: Emergency Medicine | Admitting: Emergency Medicine

## 2020-10-20 ENCOUNTER — Encounter (HOSPITAL_COMMUNITY): Payer: Self-pay | Admitting: Emergency Medicine

## 2020-10-20 ENCOUNTER — Other Ambulatory Visit: Payer: Self-pay

## 2020-10-20 DIAGNOSIS — F17210 Nicotine dependence, cigarettes, uncomplicated: Secondary | ICD-10-CM | POA: Diagnosis not present

## 2020-10-20 DIAGNOSIS — M25571 Pain in right ankle and joints of right foot: Secondary | ICD-10-CM | POA: Diagnosis not present

## 2020-10-20 DIAGNOSIS — I10 Essential (primary) hypertension: Secondary | ICD-10-CM | POA: Diagnosis not present

## 2020-10-20 DIAGNOSIS — Z7982 Long term (current) use of aspirin: Secondary | ICD-10-CM | POA: Diagnosis not present

## 2020-10-20 DIAGNOSIS — R000 Tachycardia, unspecified: Secondary | ICD-10-CM | POA: Insufficient documentation

## 2020-10-20 DIAGNOSIS — E119 Type 2 diabetes mellitus without complications: Secondary | ICD-10-CM | POA: Insufficient documentation

## 2020-10-20 DIAGNOSIS — R197 Diarrhea, unspecified: Secondary | ICD-10-CM | POA: Diagnosis not present

## 2020-10-20 DIAGNOSIS — F331 Major depressive disorder, recurrent, moderate: Secondary | ICD-10-CM | POA: Insufficient documentation

## 2020-10-20 DIAGNOSIS — Z20822 Contact with and (suspected) exposure to covid-19: Secondary | ICD-10-CM | POA: Insufficient documentation

## 2020-10-20 DIAGNOSIS — R509 Fever, unspecified: Secondary | ICD-10-CM | POA: Diagnosis not present

## 2020-10-20 DIAGNOSIS — Z79899 Other long term (current) drug therapy: Secondary | ICD-10-CM | POA: Insufficient documentation

## 2020-10-20 DIAGNOSIS — J449 Chronic obstructive pulmonary disease, unspecified: Secondary | ICD-10-CM | POA: Diagnosis not present

## 2020-10-20 DIAGNOSIS — Z7984 Long term (current) use of oral hypoglycemic drugs: Secondary | ICD-10-CM | POA: Insufficient documentation

## 2020-10-20 DIAGNOSIS — M79671 Pain in right foot: Secondary | ICD-10-CM | POA: Diagnosis present

## 2020-10-20 DIAGNOSIS — R443 Hallucinations, unspecified: Secondary | ICD-10-CM | POA: Insufficient documentation

## 2020-10-20 DIAGNOSIS — Y900 Blood alcohol level of less than 20 mg/100 ml: Secondary | ICD-10-CM | POA: Diagnosis not present

## 2020-10-20 DIAGNOSIS — R4689 Other symptoms and signs involving appearance and behavior: Secondary | ICD-10-CM | POA: Diagnosis not present

## 2020-10-20 DIAGNOSIS — F209 Schizophrenia, unspecified: Secondary | ICD-10-CM | POA: Diagnosis present

## 2020-10-20 DIAGNOSIS — R451 Restlessness and agitation: Secondary | ICD-10-CM

## 2020-10-20 LAB — CBC WITH DIFFERENTIAL/PLATELET
Abs Immature Granulocytes: 0.01 10*3/uL (ref 0.00–0.07)
Basophils Absolute: 0.1 10*3/uL (ref 0.0–0.1)
Basophils Relative: 1 %
Eosinophils Absolute: 0.1 10*3/uL (ref 0.0–0.5)
Eosinophils Relative: 1 %
HCT: 41.5 % (ref 36.0–46.0)
Hemoglobin: 13.7 g/dL (ref 12.0–15.0)
Immature Granulocytes: 0 %
Lymphocytes Relative: 32 %
Lymphs Abs: 2.7 10*3/uL (ref 0.7–4.0)
MCH: 28.6 pg (ref 26.0–34.0)
MCHC: 33 g/dL (ref 30.0–36.0)
MCV: 86.6 fL (ref 80.0–100.0)
Monocytes Absolute: 0.5 10*3/uL (ref 0.1–1.0)
Monocytes Relative: 6 %
Neutro Abs: 5 10*3/uL (ref 1.7–7.7)
Neutrophils Relative %: 60 %
Platelets: 307 10*3/uL (ref 150–400)
RBC: 4.79 MIL/uL (ref 3.87–5.11)
RDW: 15.1 % (ref 11.5–15.5)
WBC: 8.3 10*3/uL (ref 4.0–10.5)
nRBC: 0 % (ref 0.0–0.2)

## 2020-10-20 LAB — I-STAT BETA HCG BLOOD, ED (MC, WL, AP ONLY): I-stat hCG, quantitative: <5 m[IU]/mL (ref <5)

## 2020-10-20 LAB — COMPREHENSIVE METABOLIC PANEL
ALT: 12 U/L (ref 0–44)
AST: 16 U/L (ref 15–41)
Albumin: 3.5 g/dL (ref 3.5–5.0)
Alkaline Phosphatase: 56 U/L (ref 38–126)
Anion gap: 9 (ref 5–15)
BUN: 11 mg/dL (ref 6–20)
CO2: 28 mmol/L (ref 22–32)
Calcium: 9.5 mg/dL (ref 8.9–10.3)
Chloride: 101 mmol/L (ref 98–111)
Creatinine, Ser: 0.55 mg/dL (ref 0.44–1.00)
GFR, Estimated: >60 mL/min (ref >60)
Glucose, Bld: 131 mg/dL — ABNORMAL HIGH (ref 70–99)
Potassium: 3.7 mmol/L (ref 3.5–5.1)
Sodium: 138 mmol/L (ref 135–145)
Total Bilirubin: 0.6 mg/dL (ref 0.3–1.2)
Total Protein: 6.7 g/dL (ref 6.5–8.1)

## 2020-10-20 LAB — URINALYSIS, ROUTINE W REFLEX MICROSCOPIC
Bilirubin Urine: NEGATIVE
Glucose, UA: NEGATIVE mg/dL
Hgb urine dipstick: NEGATIVE
Ketones, ur: 5 mg/dL — AB
Nitrite: NEGATIVE
Protein, ur: NEGATIVE mg/dL
Specific Gravity, Urine: 1.019 (ref 1.005–1.030)
pH: 6 (ref 5.0–8.0)

## 2020-10-20 LAB — RAPID URINE DRUG SCREEN, HOSP PERFORMED
Amphetamines: NOT DETECTED
Barbiturates: NOT DETECTED
Benzodiazepines: NOT DETECTED
Cocaine: NOT DETECTED
Opiates: NOT DETECTED
Tetrahydrocannabinol: NOT DETECTED

## 2020-10-20 LAB — ETHANOL: Alcohol, Ethyl (B): <10 mg/dL (ref <10)
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
## 2020-10-20 NOTE — ED Triage Notes (Addendum)
Patient reports chronic pain at right ankle with mild swelling worse this week denies recent injury or fall . Ambulatory . Patient added A/V hallucinations .

## 2020-10-20 NOTE — ED Provider Notes (Addendum)
Emergency Medicine Provider Triage Evaluation Note  Teresa Murillo , a 60 y.o. female  was evaluated in triage.  Pt complains of increased aggressive behavior.  She is here with staff member from her group home.  She has reportedly been acting out, yelling.  The staff member with her is the administrator, states that she has not seen any of these behaviors and cannot give me specifics. Patient also mentions pain in her right ankle.  This is reportedly chronic and unchanged..  Review of Systems  Positive: Aggressive behavior Negative: fever  Physical Exam  BP (!) 147/87 (BP Location: Left Arm)   Pulse (!) 130   Temp 98.4 F (36.9 C)   Resp 20   SpO2 97%  Gen:   Awake, no distress   Resp:  Normal effort  MSK:   Moves extremities without difficulty  Other:  Patient is calm and cooperative  Medical Decision Making  Medically screening exam initiated at 9:01 PM.  Appropriate orders placed.  Teresa Murillo was informed that the remainder of the evaluation will be completed by another provider, this initial triage assessment does not replace that evaluation, and the importance of remaining in the ED until their evaluation is complete.  Note: Portions of this report may have been transcribed using voice recognition software. Every effort was made to ensure accuracy; however, inadvertent computerized transcription errors may be present    Norman Clay 10/20/20 2102  [name] [phone]- staff member/administrator at group home.          Norman Clay 10/20/20 2139    Koleen Distance, MD 10/20/20 [phone]

## 2020-10-21 ENCOUNTER — Emergency Department (HOSPITAL_COMMUNITY): Payer: Medicaid Other

## 2020-10-21 ENCOUNTER — Encounter (HOSPITAL_COMMUNITY): Payer: Self-pay | Admitting: Registered Nurse

## 2020-10-21 DIAGNOSIS — F209 Schizophrenia, unspecified: Secondary | ICD-10-CM

## 2020-10-21 DIAGNOSIS — R4689 Other symptoms and signs involving appearance and behavior: Secondary | ICD-10-CM

## 2020-10-21 LAB — RESP PANEL BY RT-PCR (FLU A&B, COVID) ARPGX2
Influenza A by PCR: NEGATIVE
Influenza B by PCR: NEGATIVE
SARS Coronavirus 2 by RT PCR: NEGATIVE

## 2020-10-21 LAB — VALPROIC ACID LEVEL: Valproic Acid Lvl: 22 ug/mL — ABNORMAL LOW (ref 50.0–100.0)

## 2020-10-21 MED ORDER — DIVALPROEX SODIUM ER 500 MG PO TB24
500.0000 mg | ORAL_TABLET | Freq: Two times a day (BID) | ORAL | Status: DC
  Administered 2020-10-21: 500 mg via ORAL
  Filled 2020-10-21 (×2): qty 1

## 2020-10-21 MED ORDER — IBUPROFEN 400 MG PO TABS: 600.0000 mg | ORAL_TABLET | Freq: Three times a day (TID) | ORAL | Status: DC | PRN

## 2020-10-21 MED ORDER — METFORMIN HCL 500 MG PO TABS: 500.0000 mg | ORAL_TABLET | Freq: Two times a day (BID) | ORAL | Status: DC

## 2020-10-21 MED ORDER — BENZTROPINE MESYLATE 0.5 MG PO TABS: 0.5000 mg | ORAL_TABLET | Freq: Every day | ORAL | Status: DC

## 2020-10-21 MED ORDER — ARIPIPRAZOLE 5 MG PO TABS
15.0000 mg | ORAL_TABLET | Freq: Every day | ORAL | Status: DC
  Filled 2020-10-21 (×2): qty 1
  Filled 2020-10-21: qty 3
  Filled 2020-10-21 (×2): qty 1

## 2020-10-21 MED ORDER — LATANOPROST 0.005 % OP SOLN: 1.0000 [drp] | Freq: Every day | OPHTHALMIC | Status: DC

## 2020-10-21 MED ORDER — OLANZAPINE 10 MG PO TABS: 10.0000 mg | ORAL_TABLET | Freq: Every day | ORAL | Status: DC

## 2020-10-21 MED ORDER — LISINOPRIL 10 MG PO TABS
10.0000 mg | ORAL_TABLET | Freq: Every day | ORAL | Status: DC
  Administered 2020-10-21: 10 mg via ORAL
  Filled 2020-10-21: qty 1

## 2020-10-21 NOTE — ED Notes (Signed)
Pt ambulated to the Restroom across the hall x2 without needing assistance

## 2020-10-21 NOTE — ED Notes (Signed)
TTS at this time. 

## 2020-10-21 NOTE — ED Notes (Signed)
Report given to [facility] in yellow zone

## 2020-10-21 NOTE — BH Assessment (Signed)
[facility] Assessment Progress Note   Per Shuvon Rankin, NP, this pt does not require psychiatric hospitalization at this time.  Pt is psychiatrically cleared.  Discharge instructions include advise pt to continue treatment with current outpatient provider.  Pt's nurse, Greig Castilla, has been notified.  Doylene Canning, MA Triage Specialist [phone]

## 2020-10-21 NOTE — Consult Note (Signed)
Telepsych Consultation   Reason for Consult:  Aggressive behavior Referring Physician:  Tilden Fossa, MD Location of Patient: [facility] Location of Provider: Other: [facility]  Patient Identification: Teresa Murillo MRN:  [id number] Principal Diagnosis: Aggressive behavior Diagnosis:  Principal Problem:   Aggressive behavior Active Problems:   Schizophrenia (HCC)   Total Time spent with patient: 30 minutes  Subjective:   Teresa Murillo is a 60 y.o. female patient admitted to [facility] ED after presenting from group home with complaints of aggressive behavior.  HPI:  Teresa Murillo, 60 y.o., female patient seen via tele health by this provider, consulted with Dr. Nelly Rout; and chart reviewed on 10/21/20.  On evaluation Teresa Murillo reports she came to the hospital because she had a fall and she was told she needed to get along better with people.  Patient reports that she is living in a group home and that the people are very nice there and that she wants to return when she leaves the hospital.  Patient denies suicidal/self-harm/homicidal ideation, psychosis, and paranoia.  Patient reports she is sleeping "pretty good" and has no problem with eating.   During evaluation Teresa Murillo is sitting up in chair with bedside table in front of her eating applesauce.  She is in no acute distress.  She is alert, oriented x 3.  Her thought process is coherent and relevant but rumination  with every question.  She was able to give the correct information for questions on her age, date of birth, current place, city, state, country, month and year, and 1 previous president.  Could only give President Trump's name but stated she did not keep up with stuff like that.  Patient is calm and cooperative. Patient speech is pressured.    Her mood is anxious but euthymic with congruent affect.  She does not appear to be responding to internal/external stimuli or delusional thoughts.  Patient denies  suicidal/self-harm/homicidal ideation, psychosis, and paranoia.   Collateral Information:  Spoke with patient's nurse Meriel Pica, RN who reports patient has been doing fine.  States there have been no behavioral outburst or unsafe behavior.  States patient has been eating well and asking for a lot of snacks.  Past Psychiatric History: schizophrenia, aggressive behavior   Past Medical History:  Past Medical History:  Diagnosis Date   Chronic obstructive pulmonary disease (COPD) (HCC)    Glaucoma    Hyperlipidemia    Hypertension    Iron deficiency    Schizoaffective disorder (HCC)    Type 2 diabetes mellitus (HCC)     Past Surgical History:  Procedure Laterality Date   TUBAL LIGATION     Family History:  Family History  Problem Relation Age of Onset   Breast cancer Maternal Grandmother    Family Psychiatric  History: Unaware Social History:  Social History   Substance and Sexual Activity  Alcohol Use Yes     Social History   Substance and Sexual Activity  Drug Use Not Currently    Social History   Socioeconomic History   Marital status: Single    Spouse name: Not on file   Number of children: Not on file   Years of education: Not on file   Highest education level: Not on file  Occupational History   Not on file  Tobacco Use   Smoking status: Every Day    Packs/day: 1.00    Types: Cigars, Cigarettes   Smokeless tobacco: Current  Vaping Use   Vaping Use: Never used  Substance and Sexual Activity   Alcohol use: Yes   Drug use: Not Currently   Sexual activity: Not Currently    Birth control/protection: Surgical  Other Topics Concern   Not on file  Social History Narrative   Not on file   Social Determinants of Health   Financial Resource Strain: Not on file  Food Insecurity: Not on file  Transportation Needs: Not on file  Physical Activity: Not on file  Stress: Not on file  Social Connections: Not on file   Additional Social History:     Allergies:   Allergies  Allergen Reactions   Penicillins     Labs:  Results for orders placed or performed during the hospital encounter of 10/20/20 (from the past 48 hour(s))  Comprehensive metabolic panel     Status: Abnormal   Collection Time: 10/20/20  9:07 PM  Result Value Ref Range   Sodium 138 135 - 145 mmol/L   Potassium 3.7 3.5 - 5.1 mmol/L   Chloride 101 98 - 111 mmol/L   CO2 28 22 - 32 mmol/L   Glucose, Bld 131 (H) 70 - 99 mg/dL    Comment: Glucose reference range applies only to samples taken after fasting for at least 8 hours.   BUN 11 6 - 20 mg/dL   Creatinine, Ser 1.61 0.44 - 1.00 mg/dL   Calcium 9.5 8.9 - 09.6 mg/dL   Total Protein 6.7 6.5 - 8.1 g/dL   Albumin 3.5 3.5 - 5.0 g/dL   AST 16 15 - 41 U/L   ALT 12 0 - 44 U/L   Alkaline Phosphatase 56 38 - 126 U/L   Total Bilirubin 0.6 0.3 - 1.2 mg/dL   GFR, Estimated >04 >54 mL/min    Comment: (NOTE) Calculated using the CKD-EPI Creatinine Equation (2021)    Anion gap 9 5 - 15    Comment: Performed at [facility], [address] [place], [place] [address]  Urine rapid drug screen (hosp performed)     Status: None   Collection Time: 10/20/20  9:07 PM  Result Value Ref Range   Opiates NONE DETECTED NONE DETECTED   Cocaine NONE DETECTED NONE DETECTED   Benzodiazepines NONE DETECTED NONE DETECTED   Amphetamines NONE DETECTED NONE DETECTED   Tetrahydrocannabinol NONE DETECTED NONE DETECTED   Barbiturates NONE DETECTED NONE DETECTED    Comment: (NOTE) DRUG SCREEN FOR MEDICAL PURPOSES ONLY.  IF CONFIRMATION IS NEEDED FOR ANY PURPOSE, NOTIFY LAB WITHIN 5 DAYS.  LOWEST DETECTABLE LIMITS FOR URINE DRUG SCREEN Drug Class                     Cutoff (ng/mL) Amphetamine and metabolites    1000 Barbiturate and metabolites    200 Benzodiazepine                 200 Tricyclics and metabolites     300 Opiates and metabolites        300 Cocaine and metabolites        300 THC                             50 Performed at [facility], [address] [place], [place] [address]   CBC with Diff     Status: None   Collection Time: 10/20/20  9:07 PM  Result Value Ref Range   WBC 8.3 4.0 - 10.5 K/uL   RBC 4.79  3.87 - 5.11 MIL/uL   Hemoglobin 13.7 12.0 - 15.0 g/dL   HCT 50.0 93.8 - 18.2 %   MCV 86.6 80.0 - 100.0 fL   MCH 28.6 26.0 - 34.0 pg   MCHC 33.0 30.0 - 36.0 g/dL   RDW 99.3 71.6 - 96.7 %   Platelets 307 150 - 400 K/uL   nRBC 0.0 0.0 - 0.2 %   Neutrophils Relative % 60 %   Neutro Abs 5.0 1.7 - 7.7 K/uL   Lymphocytes Relative 32 %   Lymphs Abs 2.7 0.7 - 4.0 K/uL   Monocytes Relative 6 %   Monocytes Absolute 0.5 0.1 - 1.0 K/uL   Eosinophils Relative 1 %   Eosinophils Absolute 0.1 0.0 - 0.5 K/uL   Basophils Relative 1 %   Basophils Absolute 0.1 0.0 - 0.1 K/uL   Immature Granulocytes 0 %   Abs Immature Granulocytes 0.01 0.00 - 0.07 K/uL    Comment: Performed at [facility], [address], [place], [place] [address]  Urinalysis, Routine w reflex microscopic Urine, Clean Catch     Status: Abnormal   Collection Time: 10/20/20  9:07 PM  Result Value Ref Range   Color, Urine YELLOW YELLOW   APPearance HAZY (A) CLEAR   Specific Gravity, Urine 1.019 1.005 - 1.030   pH 6.0 5.0 - 8.0   Glucose, UA NEGATIVE NEGATIVE mg/dL   Hgb urine dipstick NEGATIVE NEGATIVE   Bilirubin Urine NEGATIVE NEGATIVE   Ketones, ur 5 (A) NEGATIVE mg/dL   Protein, ur NEGATIVE NEGATIVE mg/dL   Nitrite NEGATIVE NEGATIVE   Leukocytes,Ua LARGE (A) NEGATIVE   RBC / HPF 0-5 0 - 5 RBC/hpf   WBC, UA 11-20 0 - 5 WBC/hpf   Bacteria, UA FEW (A) NONE SEEN   Mucus PRESENT     Comment: Performed at [facility], [address] [place], [place] [address]  Ethanol     Status: None   Collection Time: 10/20/20  9:08 PM  Result Value Ref Range   Alcohol, Ethyl (B) <10 <10 mg/dL    Comment: (NOTE) Lowest detectable limit for serum alcohol is 10 mg/dL.  For medical purposes only. Performed at [facility], [address], [place], [place] [address]   I-Stat beta hCG blood, ED     Status: None   Collection Time: 10/20/20  9:13 PM  Result Value Ref Range   I-stat hCG, quantitative <5.0 <5 mIU/mL   Comment 3            Comment:   GEST. AGE      CONC.  (mIU/mL)   <=1 WEEK        5 - 50     2 WEEKS       50 - 500     3 WEEKS       100 - 10,000     4 WEEKS     1,000 - 30,000        FEMALE AND NON-PREGNANT FEMALE:     LESS THAN 5 mIU/mL   Resp Panel by RT-PCR (Flu A&B, Covid) Nasopharyngeal Swab     Status: None   Collection Time: 10/21/20  4:10 AM   Specimen: Nasopharyngeal Swab; Nasopharyngeal(NP) swabs in vial transport medium  Result Value Ref Range   SARS Coronavirus 2 by RT PCR NEGATIVE NEGATIVE    Comment: (NOTE) SARS-CoV-2 target nucleic acids are NOT DETECTED.  The SARS-CoV-2 RNA is generally detectable in upper  respiratory specimens during the acute phase of infection. The lowest concentration of SARS-CoV-2 viral copies this assay can detect is 138 copies/mL. A negative result does not preclude SARS-Cov-2 infection and should not be used as the sole basis for treatment or other patient management decisions. A negative result may occur with  improper specimen collection/handling, submission of specimen other than nasopharyngeal swab, presence of viral mutation(s) within the areas targeted by this assay, and inadequate number of viral copies(<138 copies/mL). A negative result must be combined with clinical observations, patient history, and epidemiological information. The expected result is Negative.  Fact Sheet for Patients:  BloggerCourse.com  Fact Sheet for Healthcare Providers:  SeriousBroker.it  This test is no t yet approved or cleared by the [place] FDA and  has been authorized for detection and/or diagnosis of SARS-CoV-2 by FDA under an Emergency Use Authorization (EUA). This EUA will remain  in  effect (meaning this test can be used) for the duration of the COVID-19 declaration under Section 564(b)(1) of the Act, 21 U.S.C.section 360bbb-3(b)(1), unless the authorization is terminated  or revoked sooner.       Influenza A by PCR NEGATIVE NEGATIVE   Influenza B by PCR NEGATIVE NEGATIVE    Comment: (NOTE) The Xpert Xpress SARS-CoV-2/FLU/RSV plus assay is intended as an aid in the diagnosis of influenza from Nasopharyngeal swab specimens and should not be used as a sole basis for treatment. Nasal washings and aspirates are unacceptable for Xpert Xpress SARS-CoV-2/FLU/RSV testing.  Fact Sheet for Patients: BloggerCourse.com  Fact Sheet for Healthcare Providers: SeriousBroker.it  This test is not yet approved or cleared by the [place] FDA and has been authorized for detection and/or diagnosis of SARS-CoV-2 by FDA under an Emergency Use Authorization (EUA). This EUA will remain in effect (meaning this test can be used) for the duration of the COVID-19 declaration under Section 564(b)(1) of the Act, 21 U.S.C. section 360bbb-3(b)(1), unless the authorization is terminated or revoked.  Performed at [facility], [address], [place], [place] [address]     Medications:  Current Facility-Administered Medications  Medication Dose Route Frequency Provider Last Rate Last Admin   ARIPiprazole (ABILIFY) tablet 15 mg  15 mg Oral Daily Earnestine Shipp B, NP       benztropine (COGENTIN) tablet 0.5 mg  0.5 mg Oral QHS Aleiah Mohammed B, NP       divalproex (DEPAKOTE ER) 24 hr tablet 500 mg  500 mg Oral BID Millenia Waldvogel B, NP       ibuprofen (ADVIL) tablet 600 mg  600 mg Oral Q8H PRN Tilden Fossa, MD       latanoprost (XALATAN) 0.005 % ophthalmic solution 1 drop  1 drop Both Eyes QHS Laneta Guerin B, NP       lisinopril (ZESTRIL) tablet 10 mg  10 mg Oral Daily Tor Tsuda B, NP       metFORMIN (GLUCOPHAGE) tablet  500 mg  500 mg Oral BID WC Medina Degraffenreid B, NP       OLANZapine (ZYPREXA) tablet 10 mg  10 mg Oral QHS Rupert Azzara B, NP       Current Outpatient Medications  Medication Sig Dispense Refill   aripiprazole (ABILIFY) 15 MG disintegrating tablet Take 15 mg by mouth daily.     benztropine (COGENTIN) 0.5 MG tablet Take 0.5 mg by mouth at bedtime.     divalproex (DEPAKOTE ER) 500 MG 24 hr tablet Take 500 mg by mouth in the morning and at  bedtime.     latanoprost (XALATAN) 0.005 % ophthalmic solution Place 1 drop into both eyes at bedtime.     lisinopril (ZESTRIL) 10 MG tablet Take 10 mg by mouth daily.     metFORMIN (GLUCOPHAGE) 500 MG tablet Take 500 mg by mouth 2 (two) times daily with a meal.     OLANZapine (ZYPREXA) 10 MG tablet Take 10 mg by mouth at bedtime.      Musculoskeletal: Strength & Muscle Tone: within normal limits Gait & Station: normal Patient leans: N/A  Psychiatric Specialty Exam:  Presentation  General Appearance: Appropriate for Environment  Eye Contact:Good  Speech:Clear and Coherent; Pressured  Speech Volume:Normal  Handedness:Right   Mood and Affect  Mood:Anxious; Euthymic  Affect:Congruent   Thought Process  Thought Processes:Coherent; Goal Directed  Descriptions of Associations:Intact  Orientation:Full (Time, Place and Person)  Thought Content:Rumination; WDL  History of Schizophrenia/Schizoaffective disorder:Yes  Duration of Psychotic Symptoms:Greater than six months  Hallucinations:Hallucinations: None (Denies)  Ideas of Reference:None (Denies)  Suicidal Thoughts:Suicidal Thoughts: No  Homicidal Thoughts:Homicidal Thoughts: No   Sensorium  Memory:Immediate Fair; Recent Fair  Judgment:Fair  Insight:Fair; Present   Executive Functions  Concentration:Fair  Attention Span:Fair  Recall:Fair  Fund of Knowledge:Fair  Language:Fair   Psychomotor Activity  Psychomotor Activity:Psychomotor Activity: Normal   Assets   Assets:Communication Skills; Desire for Improvement; Financial Resources/Insurance; Social Support; Housing   Sleep  Sleep:Sleep: Good (Reports improved)    Physical Exam: Physical Exam Vitals and nursing note reviewed. Exam conducted with a chaperone present.  Constitutional:      General: She is not in acute distress.    Appearance: Normal appearance. She is not ill-appearing.  Cardiovascular:     Rate and Rhythm: Normal rate.  Pulmonary:     Effort: Pulmonary effort is normal.  Neurological:     Mental Status: She is alert and oriented to person, place, and time.  Psychiatric:        Attention and Perception: Attention and perception normal. She does not perceive auditory or visual hallucinations.        Mood and Affect: Affect normal. Mood is anxious.        Speech: Speech is rapid and pressured.        Behavior: Behavior normal. Behavior is cooperative.        Thought Content: Thought content normal. Thought content is not paranoid or delusional. Thought content does not include homicidal or suicidal ideation.        Judgment: Judgment is impulsive.   Review of Systems  Constitutional: Negative.   HENT: Negative.    Eyes: Negative.   Respiratory: Negative.    Cardiovascular: Negative.   Gastrointestinal: Negative.   Genitourinary: Negative.   Musculoskeletal:  Positive for joint pain.  Skin: Negative.   Neurological: Negative.   Endo/Heme/Allergies: Negative.   Psychiatric/Behavioral:  Depression: Stable. Hallucinations: Denies. Substance abuse: Denies. Suicidal ideas: Denies. Nervous/anxious: Stable. Insomnia: Better.   Blood pressure (!) 133/103, pulse (!) 107, temperature (!) 97.5 F (36.4 C), temperature source Oral, resp. rate 18, SpO2 96 %. There is no height or weight on file to calculate BMI.  Treatment Plan Summary: Plan psychiatrically clear patient can return to group home and follow-up with current outpatient psychiatric provider  Disposition: No  evidence of imminent risk to self or others at present.   Patient does not meet criteria for psychiatric inpatient admission. Supportive therapy provided about ongoing stressors. Discussed crisis plan, support from social network, calling 911, coming to the Emergency Department,  and calling Suicide Hotline.  This service was provided via telemedicine using a 2-way, interactive audio and video technology.  Names of all persons participating in this telemedicine service and their role in this encounter. Name: Assunta Found Role: NP  Name: Dr. Nelly Rout Role: Psychiatrist  Name: Mechele Dawley Role: Patient  Name: Meriel Pica, RN Role: Patient's nurse sent a secure message informing: Psychiatric consult complete.  Patient psychiatrically cleared to return to group home and follow-up with current outpatient psychiatric provider.    Noeh Sparacino, NP 10/21/2020 12:56 PM

## 2020-10-21 NOTE — ED Notes (Signed)
Assumed care of patient, has finished talking to tele psych.

## 2020-10-21 NOTE — BH Assessment (Signed)
Comprehensive Clinical Assessment (CCA) Note  10/21/2020 Teresa Murillo [id number]  Discharge Disposition: Melbourne Abts, PA-C, reviewed pt's chart and information and determined pt should be observed overnight for stability and re-assessed in the morning by psychiatry. Pt is to remain at [facility] due to injured foot and not observation available at the [facility]. This information was relayed to pt's team at 0342.  The patient demonstrates the following risk factors for suicide: Chronic risk factors for suicide include: psychiatric disorder of Major depressive disorder, Recurrent episode, Moderate and previous suicide attempts "years ago" . Acute risk factors for suicide include: loss (financial, interpersonal, professional). Protective factors for this patient include: positive social support and positive therapeutic relationship. Considering these factors, the overall suicide risk at this point appears to be low. Patient is not appropriate for outpatient follow up.  Therefore, a tele-sitter is recommended for suicide precautions.  Flowsheet Row ED from 10/20/2020 in [name] EMERGENCY DEPARTMENT ED from 07/05/2020 in [facility] ED from 06/01/2020 in [facility] EM[facility]GENCY [facility]PARTMENT  C-SSRS RISK CATEGORY Low Risk No Risk No Risk     Chief Complaint:  Chief Complaint  Patient presents with   Ankle Pain    Schizophrenia   Visit Diagnosis: F33.1, Major depressive disorder, Recurrent episode, Moderate  CCA Screening, Triage and Referral (STR) Teresa Murillo is a 60 year old patient who was brought to [facility] by a staff member of her group home. Per the EDP note: Pt complains of increased aggressive behavior. She is here with staff member from her group home. She has reportedly been acting out, yelling. The staff member with her is the administrator, states that she has not seen any of these behaviors and cannot give me specifics." Pt  acknowledges she's been experiencing sadness and loneliness as well as feeling "out of it."  Pt denies SI, though she acknowledges she's experienced SI in the past and attempted to kill herself 1-2x "years ago." Pt also shares she was hospitalized for mental health concerns "a long time ago." Pt denies she has a plan to kill herself. Pt denies HI, AVH, NSSIB, access to guns/weapons, engagement with the legal system, or SA.  Pt is oriented x5. Her recent/remote memory is intact. Pt was cooperative and friendly throughout the assessment process. Pt's insight, judgement, and impulse control is fair at this time.  Patient Reported Information How did you hear about Teresa Murillo? Other (Comment) (Group Home)  What Is the Reason for Your Visit/Call Today? Pt states, "I was havign depression, schizophrenia, and also I hurt my foot." Pt shares that depression to her feels sad and lonely; she states schizophrenia to her feels, "I'm really out of it." Pt shares she lives in a group home. Pt denies current SI, HI, AVH, NSSIB, access to guns/weapons, engagement with the legal system, or SA. Pt's group home shares pt has been acting out and yelling.  How Long Has This Been Causing You Problems? 1 wk - 1 month  What Do You Feel Would Help You the Most Today? Treatment for Depression or other mood problem; Medication(s)   Have You Recently Had Any Thoughts About Hurting Yourself? No  Are You Planning to Commit Suicide/Harm Yourself At This time? No   Have you Recently Had Thoughts About Hurting Someone [name]? No  Are You Planning to Harm Someone at This Time? No  Explanation: No data recorded  Have You Used Any Alcohol or Drugs in the Past 24 Hours? No  How Long Ago  Did You Use Drugs or Alcohol? No data recorded What Did You Use and How Much? No data recorded  Do You Currently Have a Therapist/Psychiatrist? No  Name of Therapist/Psychiatrist: No data recorded  Have You Been Recently Discharged From Any  Office Practice or Programs? No  Explanation of Discharge From Practice/Program: No data recorded    CCA Screening Triage Referral Assessment Type of Contact: Tele-Assessment  Telemedicine Service Delivery: Telemedicine service delivery: This service was provided via telemedicine using a 2-way, interactive audio and video technology  Is this Initial or Reassessment? Initial Assessment  Date Telepsych consult ordered in [facility]:  10/21/20  Time Telepsych consult ordered in [facility]:  0053  Location of Assessment: [facility] ED  Provider Location: [facility] Assessment Services   Collateral Involvement: Group home staff member, per EDP note   Does Patient Have a Automotive engineer Guardian? No data recorded Name and Contact of Legal Guardian: No data recorded If Minor and Not Living with Parent(s), Who has Custody? N/A  Is CPS involved or ever been involved? Never  Is APS involved or ever been involved? Never   Patient Determined To Be At Risk for Harm To Self or Others Based on Review of Patient Reported Information or Presenting Complaint? No  Method: No data recorded Availability of Means: No data recorded Intent: No data recorded Notification Required: No data recorded Additional Information for Danger to Others Potential: No data recorded Additional Comments for Danger to Others Potential: No data recorded Are There Guns or Other Weapons in Your Home? No data recorded Types of Guns/Weapons: No data recorded Are These Weapons Safely Secured?                            No data recorded Who Could Verify You Are Able To Have These Secured: No data recorded Do You Have any Outstanding Charges, Pending Court Dates, Parole/Probation? No data recorded Contacted To Inform of Risk of Harm To Self or Others: Other: Comment (N/A)    Does Patient Present under Involuntary Commitment? No  IVC Papers Initial File Date: No data recorded  [place] of Residence: Teresa Murillo   Patient Currently  Receiving the Following Services: Group Home   Determination of Need: Routine (7 days)   Options For Referral: Medication Management; Outpatient Therapy     CCA Biopsychosocial Patient Reported Schizophrenia/Schizoaffective Diagnosis in Past: Yes   Strengths: Pt is able to identify her thoughts and feelings. She answers the questions posed. Pt identifies that she likes the group home she resides at and that she is fed good food and has a nice house and room to live in.   Mental Health Symptoms Depression:   None   Duration of Depressive symptoms:  Duration of Depressive Symptoms: N/A   Mania:   None   Anxiety:    Difficulty concentrating   Psychosis:   None (responding to stimuli)   Duration of Psychotic symptoms:  Duration of Psychotic Symptoms: N/A   Trauma:   N/A   Obsessions:   N/A   Compulsions:   None   Inattention:   Disorganized; Forgetful   Hyperactivity/Impulsivity:   N/A   Oppositional/Defiant Behaviors:   Aggression towards people/animals; Temper   Emotional Irregularity:   Mood lability; Chronic feelings of emptiness   Other Mood/Personality Symptoms:   None noted    Mental Status Exam Appearance and self-care  Stature:   Average   Weight:   Average weight  Clothing:   Age-appropriate   Grooming:   Normal   Cosmetic use:   None   Posture/gait:   Normal   Motor activity:   Not Remarkable   Sensorium  Attention:   Normal   Concentration:   Focuses on irrelevancies   Orientation:   X5   Recall/memory:   Normal   Affect and Mood  Affect:   Appropriate; Flat   Mood:   Euthymic   Relating  Eye contact:   Normal   Facial expression:   Responsive   Attitude toward examiner:   Cooperative   Thought and Language  Speech flow:  Clear and Coherent   Thought content:   Appropriate to Mood and Circumstances   Preoccupation:   None   Hallucinations:   None   Organization:  No data recorded   Affiliated Computer ServicesExecutive Functions  Fund of Knowledge:   -- Industrial/product designer(UTA)   Intelligence:   -- Industrial/product designer(UTA)   Abstraction:   Functional   Judgement:   Fair   Reality Testing:   Adequate   Insight:   Fair   Decision Making:   Impulsive   Social Functioning  Social Maturity:   Impulsive   Social Judgement:   Impropriety   Stress  Stressors:   -- Industrial/product designer(UTA)   Coping Ability:   -- Industrial/product designer(UTA)   Skill Deficits:   Decision making; Self-control   Supports:   Support needed     Religion: Religion/Spirituality Are You A Religious Person?:  (Not assessed) How Might This Affect Treatment?: Not assessed  Leisure/Recreation: Leisure / Recreation Do You Have Hobbies?:  (Not assessed)  Exercise/Diet: Exercise/Diet Do You Exercise?:  (Not assessed) Have You Gained or Lost A Significant Amount of Weight in the Past Six Months?:  (Not assessed) Do You Follow a Special Diet?:  (Not assessed) Do You Have Any Trouble Sleeping?: Yes Explanation of Sleeping Difficulties: Pt states she has difficulties sleeping at times.   CCA Employment/Education Employment/Work Situation: Employment / Work Situation Employment Situation: Unemployed Patient's Job has Been Impacted by Current Illness:  (N/A) Has Patient ever Been in the [employer]. BancorpMilitary?: No  Education: Education Is Patient Currently Attending School?: No Last Grade Completed:  (Not assessed) Did Theme park managerYou Attend College?:  (Not assessed) Did You Have An Individualized Education Program (IIEP):  (Not assessed) Did You Have Any Difficulty At School?:  (Not assessed) Patient's Education Has Been Impacted by Current Illness:  (Not assessed)   CCA Family/Childhood History Family and Relationship History: Family history Marital status: Divorced Divorced, when?: Not assessed What types of issues is patient dealing with in the relationship?: Not assessed Additional relationship information: Not assessed Does patient have children?: Yes How many children?: 1 How  is patient's relationship with their children?: Pt states she has a daughter whom was given up for adoption.  Childhood History:  Childhood History By whom was/is the patient raised?:  (Not assessed) Did patient suffer any verbal/emotional/physical/sexual abuse as a child?:  (Not assessed) Did patient suffer from severe childhood neglect?:  (Not assessed) Has patient ever been sexually abused/assaulted/raped as an adolescent or adult?:  (Not assessed) Was the patient ever a victim of a crime or a disaster?:  (Not assessed) Witnessed domestic violence?:  (Not assessed) Has patient been affected by domestic violence as an adult?:  (Not assessed)  Child/Adolescent Assessment:     CCA Substance Use Alcohol/Drug Use: Alcohol / Drug Use Pain Medications: See MAR Prescriptions: See MAR Over the Counter: See MAR History of alcohol / drug use?:  No history of alcohol / drug abuse Longest period of sobriety (when/how long): N/A Negative Consequences of Use:  (N/A) Withdrawal Symptoms:  (N/A)                         ASAM's:  Six Dimensions of Multidimensional Assessment  Dimension 1:  Acute Intoxication and/or Withdrawal Potential:      Dimension 2:  Biomedical Conditions and Complications:      Dimension 3:  Emotional, Behavioral, or Cognitive Conditions and Complications:     Dimension 4:  Readiness to Change:     Dimension 5:  Relapse, Continued use, or Continued Problem Potential:     Dimension 6:  Recovery/Living Environment:     ASAM Severity Score:    ASAM Recommended Level of Treatment: ASAM Recommended Level of Treatment:  (N/A)   Substance use Disorder (SUD) Substance Use Disorder (SUD)  Checklist Symptoms of Substance Use:  (N/A)  Recommendations for Services/Supports/Treatments: Recommendations for Services/Supports/Treatments Recommendations For Services/Supports/Treatments: Medication Management, Individual Therapy, Other (Comment) (Overnight observation at  [facility])  Discharge Disposition: Melbourne Abts, PA-C, reviewed pt's chart and information and determined pt should be observed overnight for stability and re-assessed in the morning by psychiatry. Pt is to remain at [facility] due to injured foot and not observation available at the [facility]. This information was relayed to pt's team at 0342.  DSM5 Diagnoses: Patient Active Problem List   Diagnosis Date Noted   Schizophrenia (HCC) 06/02/2020   COPD (chronic obstructive pulmonary disease) (HCC) 06/02/2020   Broken ankle 06/02/2020     Referrals to Alternative Service(s): Referred to Alternative Service(s):   Place:   Date:   Time:    Referred to Alternative Service(s):   Place:   Date:   Time:    Referred to Alternative Service(s):   Place:   Date:   Time:    Referred to Alternative Service(s):   Place:   Date:   Time:     Ralph Dowdy, LMFT

## 2020-10-21 NOTE — ED Provider Notes (Signed)
Emergency Medicine Observation Re-evaluation Note  Teresa Murillo is a 60 y.o. female, seen on rounds today.  Pt initially presented to the ED for complaints of Ankle Pain (Schizophrenia) Currently, the patient is resting.  Physical Exam  BP (!) 133/103 (BP Location: Left Arm)   Pulse (!) 107   Temp (!) 97.5 F (36.4 C) (Oral)   Resp 18   SpO2 96%  Physical Exam General: No acute distress Cardiac: Well-perfused Lungs: Nonlabored Psych: Cooperative  ED Course / MDM  EKG:EKG Interpretation  Date/Time:  Tuesday October 21 2020 00:52:29 EDT Ventricular Rate:  108 PR Interval:  142 QRS Duration: 76 QT Interval:  326 QTC Calculation: 436 R Axis:   75 Text Interpretation: Sinus tachycardia Otherwise normal ECG Confirmed by Tilden Fossa [phone]) on 10/21/2020 1:01:17 AM  I have reviewed the labs performed to date as well as medications administered while in observation.  Recent changes in the last 24 hours include psychiatry evaluation.  They have psychiatrically cleared the patient to be discharged..  Plan  Current plan is for discharge.  Jeneal Vogl is not under involuntary commitment.     Terrilee Files, MD 10/21/20 [phone]

## 2020-10-21 NOTE — Discharge Instructions (Addendum)
For your behavioral health needs you are advised to continue treatment with your current outpatient provider.  Ibuprofen as needed for your ankle pain, follow-up with your primary care doctor

## 2020-11-01 ENCOUNTER — Other Ambulatory Visit: Payer: Self-pay

## 2020-11-01 ENCOUNTER — Emergency Department (HOSPITAL_COMMUNITY): Payer: Medicaid Other

## 2020-11-01 ENCOUNTER — Emergency Department (HOSPITAL_COMMUNITY)
Admission: EM | Admit: 2020-11-01 | Discharge: 2020-11-01 | Disposition: A | Discharge status: Home / Self Care | Payer: Medicaid Other | Attending: Emergency Medicine | Admitting: Emergency Medicine

## 2020-11-01 ENCOUNTER — Encounter (HOSPITAL_COMMUNITY): Payer: Self-pay | Admitting: Emergency Medicine

## 2020-11-01 DIAGNOSIS — S0990XA Unspecified injury of head, initial encounter: Secondary | ICD-10-CM | POA: Insufficient documentation

## 2020-11-01 DIAGNOSIS — Z7984 Long term (current) use of oral hypoglycemic drugs: Secondary | ICD-10-CM | POA: Diagnosis not present

## 2020-11-01 DIAGNOSIS — R42 Dizziness and giddiness: Secondary | ICD-10-CM | POA: Insufficient documentation

## 2020-11-01 DIAGNOSIS — R220 Localized swelling, mass and lump, head: Secondary | ICD-10-CM | POA: Diagnosis not present

## 2020-11-01 DIAGNOSIS — E119 Type 2 diabetes mellitus without complications: Secondary | ICD-10-CM | POA: Insufficient documentation

## 2020-11-01 DIAGNOSIS — F17210 Nicotine dependence, cigarettes, uncomplicated: Secondary | ICD-10-CM | POA: Insufficient documentation

## 2020-11-01 DIAGNOSIS — Z79899 Other long term (current) drug therapy: Secondary | ICD-10-CM | POA: Insufficient documentation

## 2020-11-01 DIAGNOSIS — J449 Chronic obstructive pulmonary disease, unspecified: Secondary | ICD-10-CM | POA: Insufficient documentation

## 2020-11-01 DIAGNOSIS — S0081XA Abrasion of other part of head, initial encounter: Secondary | ICD-10-CM | POA: Diagnosis not present

## 2020-11-01 DIAGNOSIS — I10 Essential (primary) hypertension: Secondary | ICD-10-CM | POA: Diagnosis not present

## 2020-11-01 DIAGNOSIS — W0110XA Fall on same level from slipping, tripping and stumbling with subsequent striking against unspecified object, initial encounter: Secondary | ICD-10-CM | POA: Diagnosis not present

## 2020-11-01 DIAGNOSIS — Y92002 Bathroom of unspecified non-institutional (private) residence single-family (private) house as the place of occurrence of the external cause: Secondary | ICD-10-CM | POA: Insufficient documentation

## 2020-11-01 NOTE — ED Provider Notes (Signed)
[facility] EMERGENCY DEPARTMENT Provider Note   CSN: [id number] Arrival date & time: 11/01/20  7824     History Chief Complaint  Patient presents with   Teresa Murillo    Dwanna Goshert is a 60 y.o. female.  Patient states that they are getting up to go to the bathroom this morning tripped lost her balance and fell forwards and hit her head.  Complaining of some dizziness afterwards but denies loss of consciousness.  Denies pain elsewhere no neck pain no back pain no other extremity pain.  No reports of fevers or cough or vomiting or diarrhea.      Past Medical History:  Diagnosis Date   Chronic obstructive pulmonary disease (COPD) (HCC)    Glaucoma    Hyperlipidemia    Hypertension    Iron deficiency    Schizoaffective disorder (HCC)    Type 2 diabetes mellitus (HCC)     Patient Active Problem List   Diagnosis Date Noted   Aggressive behavior 10/21/2020   Schizophrenia (HCC) 06/02/2020   COPD (chronic obstructive pulmonary disease) (HCC) 06/02/2020   Broken ankle 06/02/2020    Past Surgical History:  Procedure Laterality Date   TUBAL LIGATION       OB History     Gravida  1   Para  1   Term  1   Preterm  0   AB  0   Living  1      SAB  0   IAB  0   Ectopic  0   Multiple      Live Births  1           Family History  Problem Relation Age of Onset   Breast cancer Maternal Grandmother     Social History   Tobacco Use   Smoking status: Every Day    Packs/day: 1.00    Types: Cigars, Cigarettes   Smokeless tobacco: Current  Vaping Use   Vaping Use: Never used  Substance Use Topics   Alcohol use: Yes   Drug use: Not Currently    Home Medications Prior to Admission medications   Medication Sig Start Date End Date Taking? Authorizing Provider  aripiprazole (ABILIFY) 15 MG disintegrating tablet Take 15 mg by mouth daily.    [provider]  benztropine (COGENTIN) 0.5 MG tablet Take 0.5 mg by mouth at bedtime.     [provider]  divalproex (DEPAKOTE ER) 500 MG 24 hr tablet Take 500 mg by mouth in the morning and at bedtime.    [provider]  latanoprost (XALATAN) 0.005 % ophthalmic solution Place 1 drop into both eyes at bedtime.    [provider]  lisinopril (ZESTRIL) 10 MG tablet Take 10 mg by mouth daily.    [provider]  metFORMIN (GLUCOPHAGE) 500 MG tablet Take 500 mg by mouth 2 (two) times daily with a meal.    [provider]  OLANZapine (ZYPREXA) 10 MG tablet Take 10 mg by mouth at bedtime.    [provider]    Allergies    Penicillins  Review of Systems   Review of Systems  Physical Exam Updated Vital Signs BP 125/87 (BP Location: Left Arm)   Pulse 92   Temp 97.8 F (36.6 C) (Oral)   Resp 17   Ht 5\' 5"  (1.651 m)   Wt 68 kg   SpO2 97%   BMI 24.96 kg/m   Physical Exam Constitutional:      General:  She is not in acute distress.    Appearance: Normal appearance.  HENT:     Head:     Comments: Swelling and abrasion to left side of the forehead approximately 2 x 2 cm.    Nose: Nose normal.  Eyes:     Extraocular Movements: Extraocular movements intact.  Cardiovascular:     Rate and Rhythm: Normal rate.  Pulmonary:     Effort: Pulmonary effort is normal.  Musculoskeletal:        General: Normal range of motion.     Cervical back: Normal range of motion.  Neurological:     General: No focal deficit present.     Mental Status: She is alert. Mental status is at baseline.    ED Results / Procedures / Treatments   Labs (all labs ordered are listed, but only abnormal results are displayed) Labs Reviewed - No data to display  EKG None  Radiology CT Head Wo Contrast  Result Date: 11/01/2020 CLINICAL DATA:  Patient status post fall hitting forehead. Hematoma. Dizziness. EXAM: CT HEAD WITHOUT CONTRAST TECHNIQUE: Contiguous axial images were obtained from the base of the skull through the vertex without  intravenous contrast. COMPARISON:  None. FINDINGS: Brain: Ventricles and sulci are appropriate for patient's age. No evidence for acute cortically based infarct, intracranial hemorrhage, mass lesion or mass-effect. Vascular: Unremarkable Skull: Intact. Sinuses/Orbits: Paranasal sinuses are well aerated. Mastoid air cells are unremarkable. Other: Left forehead soft tissue swelling. IMPRESSION: No acute intracranial process. Electronically Signed   By: Annia Belt M.D.   On: 11/01/2020 09:22    Procedures Procedures   Medications Ordered in ED Medications - No data to display  ED Course  I have reviewed the triage vital signs and the nursing notes.  Pertinent labs & imaging results that were available during my care of the patient were reviewed by me and considered in my medical decision making (see chart for details).    MDM Rules/Calculators/A&P                           CT imaging is normal.  Patient is at baseline.  No focal neurodeficit noted.  Discharged home in stable condition, advised follow-up with her doctor within the week.  Advising immediate return for worsening symptoms, persistent vomiting or any additional concerns.  Final Clinical Impression(s) / ED Diagnoses Final diagnoses:  Injury of head, initial encounter    Rx / DC Orders ED Discharge Orders     None        Cheryll Cockayne, MD 11/01/20 1041

## 2020-11-01 NOTE — ED Triage Notes (Signed)
Pt tripped when she got up to use bathroom this morning and hit L forehead.  Reports hematoma and dizziness after hitting head.  Denies LOC.

## 2020-11-01 NOTE — Discharge Instructions (Addendum)
Call your primary care doctor to follow up in 1 week.  Return immediately back to the ER if:  Your symptoms worsen within the next 12-24 hours. You develop new symptoms such as new fevers, persistent vomiting, new pain, shortness of breath, or new weakness or numbness, or if you have any other concerns.

## 2020-12-02 ENCOUNTER — Encounter (HOSPITAL_COMMUNITY): Payer: Self-pay | Admitting: Radiology

## 2021-08-04 ENCOUNTER — Ambulatory Visit (HOSPITAL_BASED_OUTPATIENT_CLINIC_OR_DEPARTMENT_OTHER)
Admission: RE | Admit: 2021-08-04 | Discharge: 2021-08-04 | Disposition: A | Discharge status: Home / Self Care | Payer: Medicaid Other | Source: Ambulatory Visit | Attending: Nurse Practitioner | Admitting: Nurse Practitioner

## 2021-08-04 ENCOUNTER — Other Ambulatory Visit (HOSPITAL_BASED_OUTPATIENT_CLINIC_OR_DEPARTMENT_OTHER): Payer: Self-pay | Admitting: Nurse Practitioner

## 2021-08-04 ENCOUNTER — Other Ambulatory Visit (HOSPITAL_BASED_OUTPATIENT_CLINIC_OR_DEPARTMENT_OTHER): Payer: Self-pay | Admitting: Physician Assistant

## 2021-08-04 DIAGNOSIS — M79641 Pain in right hand: Secondary | ICD-10-CM

## 2021-08-22 ENCOUNTER — Other Ambulatory Visit: Payer: Self-pay

## 2021-08-22 ENCOUNTER — Emergency Department (HOSPITAL_BASED_OUTPATIENT_CLINIC_OR_DEPARTMENT_OTHER)
Admission: EM | Admit: 2021-08-22 | Discharge: 2021-08-22 | Disposition: A | Discharge status: Home / Self Care | Payer: Medicaid Other | Attending: Emergency Medicine | Admitting: Emergency Medicine

## 2021-08-22 ENCOUNTER — Encounter (HOSPITAL_BASED_OUTPATIENT_CLINIC_OR_DEPARTMENT_OTHER): Payer: Self-pay | Admitting: Emergency Medicine

## 2021-08-22 DIAGNOSIS — R21 Rash and other nonspecific skin eruption: Secondary | ICD-10-CM | POA: Diagnosis present

## 2021-08-22 DIAGNOSIS — L509 Urticaria, unspecified: Secondary | ICD-10-CM | POA: Insufficient documentation

## 2021-08-22 DIAGNOSIS — R000 Tachycardia, unspecified: Secondary | ICD-10-CM | POA: Diagnosis not present

## 2021-08-22 DIAGNOSIS — T7840XA Allergy, unspecified, initial encounter: Secondary | ICD-10-CM

## 2021-08-22 MED ORDER — EPINEPHRINE 0.3 MG/0.3ML IJ SOAJ
0.3000 mg | Freq: Once | INTRAMUSCULAR | Status: AC
  Administered 2021-08-22: 0.3 mg via INTRAMUSCULAR
  Filled 2021-08-22: qty 0.3

## 2021-08-22 MED ORDER — DIPHENHYDRAMINE HCL 50 MG/ML IJ SOLN
25.0000 mg | INTRAMUSCULAR | Status: AC
  Administered 2021-08-22: 25 mg via INTRAVENOUS
  Filled 2021-08-22: qty 1

## 2021-08-22 MED ORDER — FAMOTIDINE IN NACL 20-0.9 MG/50ML-% IV SOLN
20.0000 mg | Freq: Once | INTRAVENOUS | Status: AC
  Administered 2021-08-22: 20 mg via INTRAVENOUS
  Filled 2021-08-22: qty 50

## 2021-08-22 MED ORDER — METHYLPREDNISOLONE SODIUM SUCC 125 MG IJ SOLR
125.0000 mg | Freq: Once | INTRAMUSCULAR | Status: AC
  Administered 2021-08-22: 125 mg via INTRAVENOUS
  Filled 2021-08-22: qty 2

## 2021-08-22 MED ORDER — LACTATED RINGERS IV BOLUS
1000.0000 mL | Freq: Once | INTRAVENOUS | Status: AC
  Administered 2021-08-22: 1000 mL via INTRAVENOUS

## 2021-09-04 ENCOUNTER — Ambulatory Visit (HOSPITAL_COMMUNITY)
Admission: EM | Admit: 2021-09-04 | Discharge: 2021-09-04 | Disposition: A | Discharge status: Home / Self Care | Payer: No Typology Code available for payment source | Attending: Psychiatry | Admitting: Psychiatry

## 2021-09-04 DIAGNOSIS — F79 Unspecified intellectual disabilities: Secondary | ICD-10-CM | POA: Insufficient documentation

## 2021-09-04 DIAGNOSIS — F69 Unspecified disorder of adult personality and behavior: Secondary | ICD-10-CM

## 2021-09-04 DIAGNOSIS — F209 Schizophrenia, unspecified: Secondary | ICD-10-CM

## 2021-09-04 NOTE — Progress Notes (Signed)
   09/04/21 1020  [facility] Triage Screening (Walk-ins at [facility] only)  How Did You Hear About [place]? Self  What Is the Reason for Your Visit/Call Today? Patient presents with ALF staff.  She has a hx of Schizophrenia and resides at [facility].  Patient presents with unclear compliant of "not doing well."  Staff reports patient has been "off" and more irritable, calling staff "out of their name" and refusing to comply with directives.  Staff reports she "threw herself in the floor earlier."   She has also been keeping staff up at night, asking for food.  Patient admits to SI, HI yesterday, however struggles to elaborate.  She denies current SI, HI or AVH and states she just needed to "talk to someone."  She has an outpatient therapist (staff unsure of name) and she is involved in a Day treatment program, [facility] 5 days per week.  How Long Has This Been Causing You Problems? 1 wk - 1 month  Have You Recently Had Any Thoughts About Hurting Yourself? Yes  How long ago did you have thoughts about hurting yourself? yesterday, pt unable to elaborate, seems thoughts are passive - denies current SI  Are You Planning to Commit Suicide/Harm Yourself At This time? No  Have you Recently Had Thoughts About Hurting Someone [name]? No  Are You Planning To Harm Someone At This Time? No  Are you currently experiencing any auditory, visual or other hallucinations? No  Have You Used Any Alcohol or Drugs in the Past 24 Hours? No  Do you have any current medical co-morbidities that require immediate attention? No  Clinician description of patient physical appearance/behavior: Patient is calm, cooperative, struggles to provide hx  What Do You Feel Would Help You the Most Today? Treatment for Depression or other mood problem  If access to [facility] was not available, would you have sought care in the Emergency Department? No  Determination of Need Routine (7 days)  Options For Referral Medication  Management;Outpatient Therapy

## 2021-09-04 NOTE — ED Provider Notes (Signed)
Behavioral Health Urgent Care Medical Screening Exam  Patient Name: Teresa Murillo MRN: [id number] Date of Evaluation: 09/04/21 Chief Complaint:   Diagnosis:  Final diagnoses:  Schizophrenia, unspecified type (HCC)  Behavior concern in adult    History of Present illness: Teresa Murillo is a 61 y.o. female. Patient presents voluntarily to [facility] behavioral health for walk-in assessment.  Patient is accompanied by ALF staff member, [facility], who remains present during assessment as patient prefers. Teresa Murillo confirms that Teresa Murillo remains her own legal guardian.   Patient is assessed, face-to-face, by nurse practitioner, seated in assessment area, no acute distress.  She  is alert and oriented, pleasant and cooperative during assessment. Teresa Murillo is seated at table with [name], drinking juice upon my approach. She appears neat, attire appropriate.   Teresa Murillo states "I have had a rough time, I have been having problems because I have no connection with my family and I may be having some trouble."   Patient reports she experienced difficulty sleeping last night and laid on floor in an attempt to "get cool in front of the fan."   Chronic stressors include the death of patient's mother in 06/13/1997, since then she has little contact with her siblings who live in [place], [place] and [place].   Recent stressors include inability to attend day program this week as it is closed for the week related to independence day. Patient reports enjoying her day program at [facility].  Patient states "I am ready to go home, it is a good place to stay." She reports plan to "go home and get some sleep and go back to my day program."  She endorses plan to comply with directions from staff members,   Teresa Murillo has been diagnosed with schizophrenia and aggressive behavior.  She is followed for outpatient psychiatry by Dr. Coralie Keens with [facility]. Last seen by Dr Coralie Keens in May 2023. She reports she is compliant with  medications including Seroquel, [facility] staff person agrees patient is complaint with medication. Medication administration assisted by staff person.  Patient does [facility] however it is not clear currently if she participates in this available counseling. No family mental health history reported. Patient endorses history of multiple previous inpatient psychiatric hospitalizations, most recent hospitalization approx one year ago.  Patient  presents with euthymic mood, congruent affect. She  denies suicidal and homicidal ideations. Denies history of suicide attempts, denies history of non suicidal self-harm behavior.  Patient easily  contracts verbally for safety with this Clinical research associate.    Patient has normal speech and behavior. She  denies auditory and visual hallucinations.  Patient is able to converse coherently with goal-directed thoughts and no distractibility or preoccupation.  Denies symptoms of paranoia.  Objectively there is no evidence of psychosis/mania or delusional thinking.  Patient resides in an assisted living facility, denies access to weapons. She is not employed. She denies alcohol and substance use. Patient endorses average appetite.  Patient offered support and encouragement.  She gives verbal consent to speak with staff member, Teresa Murillo and [facility] staff and owner, Teresa Murillo phone number [phone].  [facility] care home manager, Teresa Murillo reports Dr Miles Costain has recently updated medications and Teresa Murillo has an upcoming appointment on 09/15/21.  Staff member  "have been trying to get an emergency appointment for three weeks." Per [name], patient has been diagnosed with intellectual disability and mental illness.   Teresa Murillo, staff member,  reports patient has exhibited "worsening behavior for 3  weeks."  Behaviors include patient's unwillingness to follow directions as well as once walking onto the front porch yelling "help."  Patient reportedly  told staff member on yesterday that she had "thoughts to harm herself and was hearing voices." Per [facility] patient is diagnosed with diabetes mellitus and has recently experienced weight gain therefore some food and soft drinks have been withheld over the last several weeks, causing frustration for Teresa Murillo.   [name], who remains present, denies reporting thoughts of self-harm also denies experiencing hallucinations.    Patient and caregivers educated and verbalize understanding of mental health resources and other crisis services in the community. They are instructed to call 911 and present to the nearest emergency room should patient experience any suicidal/homicidal ideation, auditory/visual/hallucinations, or detrimental worsening of mental health condition.    Psychiatric Specialty Exam  Presentation  General Appearance:Appropriate for Environment; Casual  Eye Contact:Good  Speech:Clear and Coherent; Normal Rate  Speech Volume:Normal  Handedness:Right   Mood and Affect  Mood:Euthymic  Affect:Appropriate; Congruent   Thought Process  Thought Processes:Coherent; Goal Directed; Linear  Descriptions of Associations:Intact  Orientation:Full (Time, Place and Person)  Thought Content:Logical; WDL  Diagnosis of Schizophrenia or Schizoaffective disorder in past: Yes  Duration of Psychotic Symptoms: Greater than six months  Hallucinations:None  Ideas of Reference:None  Suicidal Thoughts:No  Homicidal Thoughts:No   Sensorium  Memory:Immediate Fair  Judgment:Intact  Insight:Fair   Executive Functions  Concentration:Good  Attention Span:Good  Recall:Fair  Fund of Knowledge:Fair  Language:Good   Psychomotor Activity  Psychomotor Activity:Normal   Assets  Assets:Communication Skills; Desire for Improvement; Financial Resources/Insurance; Housing; Intimacy; Leisure Time; Physical Health; Resilience; Social Support   Sleep  Sleep:Fair  Number of hours: No  data recorded  No data recorded  Physical Exam: Physical Exam Vitals and nursing note reviewed.  Constitutional:      Appearance: Normal appearance. She is well-developed and normal weight.  HENT:     Head: Normocephalic and atraumatic.     Nose: Nose normal.  Cardiovascular:     Rate and Rhythm: Normal rate.  Pulmonary:     Effort: Pulmonary effort is normal.  Musculoskeletal:        General: Normal range of motion.     Cervical back: Normal range of motion.  Skin:    General: Skin is warm and dry.  Neurological:     Mental Status: She is alert and oriented to person, place, and time.  Psychiatric:        Attention and Perception: Attention and perception normal.        Mood and Affect: Mood and affect normal.        Speech: Speech normal.        Behavior: Behavior normal. Behavior is cooperative.        Thought Content: Thought content normal.        Cognition and Memory: Cognition and memory normal.    Review of Systems  Constitutional: Negative.   HENT: Negative.    Eyes: Negative.   Respiratory: Negative.    Cardiovascular: Negative.   Gastrointestinal: Negative.   Genitourinary: Negative.   Musculoskeletal: Negative.   Skin: Negative.   Neurological: Negative.   Psychiatric/Behavioral: Negative.     Blood pressure 115/75, pulse (!) 105, temperature 97.9 F (36.6 C), temperature source Oral, resp. rate 19, SpO2 96 %. There is no height or weight on file to calculate BMI.  Musculoskeletal: Strength & Muscle Tone: within normal limits Gait & Station: normal Patient leans: N/A  [facility] MSE Discharge Disposition for Follow up and Recommendations: Based on my evaluation the patient does not appear to have an emergency medical condition and can be discharged with resources and follow up care in outpatient services for Medication Management and Individual Therapy Patient reviewed with Dr Bronwen Betters. Follow up with established outpatient psychiatry, [facility]. Continue current medications.    Lenard Lance, FNP 09/04/2021, 11:43 AM

## 2021-09-04 NOTE — Discharge Instructions (Addendum)

## 2021-09-06 ENCOUNTER — Encounter (HOSPITAL_COMMUNITY): Payer: Self-pay | Admitting: Emergency Medicine

## 2021-09-06 ENCOUNTER — Ambulatory Visit (HOSPITAL_COMMUNITY)
Admission: EM | Admit: 2021-09-06 | Discharge: 2021-09-07 | Disposition: A | Discharge status: Home / Self Care | Payer: No Typology Code available for payment source | Attending: Urology | Admitting: Urology

## 2021-09-06 DIAGNOSIS — F209 Schizophrenia, unspecified: Secondary | ICD-10-CM

## 2021-09-06 NOTE — ED Notes (Signed)
RN spoke with Josiah Lobo, Owner of [facility] to inform staff that pt would be ruturning via [employer] transport.

## 2021-09-06 NOTE — ED Provider Notes (Signed)
Behavioral Health Urgent Care Medical Screening Exam  Patient Name: Teresa Murillo MRN: [id number] Date of Evaluation: 09/06/21 Chief Complaint:   Diagnosis:  Final diagnoses:  Schizophrenia, unspecified type (HCC)    History of Present illness: Teresa Murillo is a 61 y.o. female who presented voluntarily and unaccompanied via law enforcement from her group home [name]) for a walk-in assessment.   Patient was seen face to face and her chart was reviewed by this NP. On evaluation, she is alert and oriented X4. She is calm and cooperative, her mood is euthymic with congruent affect. She did not appear to be responding to any internal or external stimuli. No delusional thought content noted. She denies suicidal and homicidal ideations. She denies history of self harming or suicidal attempts. She denies paranoia, auditory/visual hallucination, and substance use.   Patient reports that she was brought to [facility] for an evaluation due to a verbal altercation with a staff member. She says she was thirsty and requested fluid. She says the staff told her she can only drink water due to her blood sugar; she says when she attempted to get water to drink the staff the staff refused to give her water. She said she became upset and refused to clean her room because she was thirsty. She said she argued with the staff about cleaning her room and staff contact law enforcement. She denied making any threats or aggression towards staff or others. She denies making suicidal/homicidal comments/gesture. She says she is able to contract for safety.   TTS will contact [facility] group home [phone] for collateral information.   Per Duard Brady, LMFT: "Clinician attempted to make contact with pt's AFL/group home owner at 2147 but there was no answer; clinician left a HIPAA-compliant voicemail message requesting she return clinician's phone call."  Psychiatric Specialty  Exam  Presentation  General Appearance:Fairly Groomed  Eye Contact:Good  Speech:Clear and Coherent  Speech Volume:Normal  Handedness:Right   Mood and Affect  Mood:Euthymic  Affect:Congruent   Thought Process  Thought Processes:Coherent  Descriptions of Associations:Intact  Orientation:Full (Time, Place and Person)  Thought Content:WDL  Diagnosis of Schizophrenia or Schizoaffective disorder in past: Yes  Duration of Psychotic Symptoms: Greater than six months  Hallucinations:None  Ideas of Reference:None  Suicidal Thoughts:No  Homicidal Thoughts:No   Sensorium  Memory:Immediate Good; Recent Fair; Remote Fair  Judgment:Intact  Insight:Fair   Executive Functions  Concentration:Good  Attention Span:Good  Recall:Good  Fund of Knowledge:Good  Language:Good   Psychomotor Activity  Psychomotor Activity:Normal   Assets  Assets:Communication Skills; Desire for Improvement; Physical Health; Housing   Sleep  Sleep:Fair  Number of hours: 6   No data recorded  Physical Exam: Physical Exam Vitals and nursing note reviewed.  Constitutional:      General: She is not in acute distress.    Appearance: She is well-developed. She is not ill-appearing.  HENT:     Head: Normocephalic and atraumatic.  Eyes:     Conjunctiva/sclera: Conjunctivae normal.  Cardiovascular:     Rate and Rhythm: Normal rate.  Pulmonary:     Effort: Pulmonary effort is normal. No respiratory distress.     Breath sounds: Normal breath sounds.  Abdominal:     Palpations: Abdomen is soft.     Tenderness: There is no abdominal tenderness.  Musculoskeletal:        General: No swelling.     Cervical back: Neck supple.  Skin:    General: Skin is warm.  Capillary Refill: Capillary refill takes less than 2 seconds.  Neurological:     Mental Status: She is alert and oriented to person, place, and time.  Psychiatric:        Attention and Perception: Attention and  perception normal.        Mood and Affect: Mood normal.        Speech: Speech normal.        Behavior: Behavior normal. Behavior is cooperative.        Thought Content: Thought content normal.        Cognition and Memory: Cognition normal.    Review of Systems  Constitutional: Negative.   HENT: Negative.    Eyes: Negative.   Respiratory: Negative.    Cardiovascular: Negative.   Gastrointestinal: Negative.   Genitourinary: Negative.   Musculoskeletal: Negative.   Skin: Negative.   Neurological: Negative.   Endo/Heme/Allergies: Negative.   Psychiatric/Behavioral: Negative.     Blood pressure 115/89, pulse 91, temperature 98.7 F (37.1 C), temperature source Oral, resp. rate 18, SpO2 99 %. There is no height or weight on file to calculate BMI.  Musculoskeletal: Strength & Muscle Tone: within normal limits Gait & Station: normal Patient leans: Right   [facility] MSE Discharge Disposition for Follow up and Recommendations: Based on my evaluation the patient does not appear to have an emergency medical condition and can be discharged with resources and follow up care in outpatient services for Medication Management and Individual Therapy  Patient continues to deny SI, HI, AVH, and paranoia. She says she is complaint with her medications (she is unsure of the names of her meds). Per chart review, group home staff confirmed that patient is complaint with medication and has an upcoming appointment on 09/15/21 for medication adjustment. She is contracting for safety.   No evidence of imminent danger to self or others at this time. Patient does not meet criteria for psychiatric admission or IVC. Supportive therapy provided about ongoing stressors. Discussed crisis plan, callling [phone] or going to Emergency Dept      Maricela Bo, NP 09/06/2021, 9:53 PM

## 2021-09-06 NOTE — BH Assessment (Signed)
Clinician attempted to make contact with pt's AFL/group home owner at 2147 but there was no answer; clinician left a HIPAA-compliant voicemail message requesting she return clinician's phone call.

## 2021-09-06 NOTE — Discharge Instructions (Signed)

## 2021-09-06 NOTE — ED Triage Notes (Signed)
Patient presents voluntarily escorted by [employer] due to ongoing nightmares and "bad thoughts". Pt states he has been experiencing these nightmares for about 1 month. She has a hx of Schizophrenia and resides at [facility]. Per [employer] pts ALF wanted the pt to be evaluated because she has been exhibiting aggressive behavior and they want her medication to be adjusted. Pt denies SI/HI,NSSIB, alcohol or drug use, and AVH.

## 2021-09-06 NOTE — ED Notes (Signed)
CAlled [employer] dispatch to return pt to [facility], [address] [place] [address].

## 2021-09-07 NOTE — ED Notes (Signed)
[employer] transport requested a 2nd time to [facility], [place], [place] [address].

## 2021-09-07 NOTE — ED Notes (Signed)
[employer] in Dept to return pt to [facility].

## 2021-09-09 ENCOUNTER — Emergency Department (HOSPITAL_COMMUNITY): Payer: Medicaid Other

## 2021-09-09 ENCOUNTER — Emergency Department (HOSPITAL_COMMUNITY)
Admission: EM | Admit: 2021-09-09 | Discharge: 2021-09-10 | Disposition: A | Discharge status: Home / Self Care | Payer: Medicaid Other | Attending: Emergency Medicine | Admitting: Emergency Medicine

## 2021-09-09 ENCOUNTER — Other Ambulatory Visit: Payer: Self-pay

## 2021-09-09 ENCOUNTER — Encounter (HOSPITAL_COMMUNITY): Payer: Self-pay

## 2021-09-09 DIAGNOSIS — J449 Chronic obstructive pulmonary disease, unspecified: Secondary | ICD-10-CM | POA: Insufficient documentation

## 2021-09-09 DIAGNOSIS — I3139 Other pericardial effusion (noninflammatory): Secondary | ICD-10-CM

## 2021-09-09 DIAGNOSIS — E119 Type 2 diabetes mellitus without complications: Secondary | ICD-10-CM | POA: Insufficient documentation

## 2021-09-09 DIAGNOSIS — R0602 Shortness of breath: Secondary | ICD-10-CM | POA: Insufficient documentation

## 2021-09-09 DIAGNOSIS — I700 Atherosclerosis of aorta: Secondary | ICD-10-CM | POA: Insufficient documentation

## 2021-09-09 LAB — CBC WITH DIFFERENTIAL/PLATELET
Abs Immature Granulocytes: 0.03 10*3/uL (ref 0.00–0.07)
Basophils Absolute: 0.1 10*3/uL (ref 0.0–0.1)
Basophils Relative: 1 %
Eosinophils Absolute: 0.1 10*3/uL (ref 0.0–0.5)
Eosinophils Relative: 1 %
HCT: 38 % (ref 36.0–46.0)
Hemoglobin: 11.8 g/dL — ABNORMAL LOW (ref 12.0–15.0)
Immature Granulocytes: 0 %
Lymphocytes Relative: 20 %
Lymphs Abs: 1.7 10*3/uL (ref 0.7–4.0)
MCH: 26 pg (ref 26.0–34.0)
MCHC: 31.1 g/dL (ref 30.0–36.0)
MCV: 83.9 fL (ref 80.0–100.0)
Monocytes Absolute: 0.7 10*3/uL (ref 0.1–1.0)
Monocytes Relative: 8 %
Neutro Abs: 5.9 10*3/uL (ref 1.7–7.7)
Neutrophils Relative %: 70 %
Platelets: 442 10*3/uL — ABNORMAL HIGH (ref 150–400)
RBC: 4.53 MIL/uL (ref 3.87–5.11)
RDW: 17.8 % — ABNORMAL HIGH (ref 11.5–15.5)
WBC: 8.5 10*3/uL (ref 4.0–10.5)
nRBC: 0 % (ref 0.0–0.2)

## 2021-09-09 LAB — URINALYSIS, ROUTINE W REFLEX MICROSCOPIC
Bilirubin Urine: NEGATIVE
Glucose, UA: NEGATIVE mg/dL
Hgb urine dipstick: NEGATIVE
Ketones, ur: NEGATIVE mg/dL
Leukocytes,Ua: NEGATIVE
Nitrite: NEGATIVE
Protein, ur: NEGATIVE mg/dL
Specific Gravity, Urine: 1.002 — ABNORMAL LOW (ref 1.005–1.030)
pH: 6 (ref 5.0–8.0)

## 2021-09-09 LAB — COMPREHENSIVE METABOLIC PANEL
ALT: 13 U/L (ref 0–44)
AST: 13 U/L — ABNORMAL LOW (ref 15–41)
Albumin: 3.1 g/dL — ABNORMAL LOW (ref 3.5–5.0)
Alkaline Phosphatase: 66 U/L (ref 38–126)
Anion gap: 10 (ref 5–15)
BUN: 12 mg/dL (ref 6–20)
CO2: 25 mmol/L (ref 22–32)
Calcium: 9 mg/dL (ref 8.9–10.3)
Chloride: 99 mmol/L (ref 98–111)
Creatinine, Ser: 0.74 mg/dL (ref 0.44–1.00)
GFR, Estimated: >60 mL/min (ref >60)
Glucose, Bld: 107 mg/dL — ABNORMAL HIGH (ref 70–99)
Potassium: 4.3 mmol/L (ref 3.5–5.1)
Sodium: 134 mmol/L — ABNORMAL LOW (ref 135–145)
Total Bilirubin: 0.2 mg/dL — ABNORMAL LOW (ref 0.3–1.2)
Total Protein: 7.4 g/dL (ref 6.5–8.1)

## 2021-09-09 LAB — TROPONIN I (HIGH SENSITIVITY): Troponin I (High Sensitivity): 5 ng/L (ref <18)

## 2021-09-09 LAB — RAPID URINE DRUG SCREEN, HOSP PERFORMED
Amphetamines: NOT DETECTED
Barbiturates: NOT DETECTED
Benzodiazepines: NOT DETECTED
Cocaine: NOT DETECTED
Opiates: NOT DETECTED
Tetrahydrocannabinol: NOT DETECTED

## 2021-09-09 LAB — ETHANOL: Alcohol, Ethyl (B): <10 mg/dL (ref <10)

## 2021-09-09 LAB — AMMONIA: Ammonia: 37 umol/L — ABNORMAL HIGH (ref 9–35)

## 2021-09-09 LAB — LIPASE, BLOOD: Lipase: 33 U/L (ref 11–51)

## 2021-09-09 LAB — D-DIMER, QUANTITATIVE: D-Dimer, Quant: 0.92 ug/mL-FEU — ABNORMAL HIGH (ref 0.00–0.50)

## 2021-09-09 MED ORDER — LISINOPRIL 10 MG PO TABS
10.0000 mg | ORAL_TABLET | Freq: Every day | ORAL | Status: DC
  Administered 2021-09-10: 10 mg via ORAL
  Filled 2021-09-09: qty 1

## 2021-09-09 MED ORDER — IOHEXOL 350 MG/ML SOLN
75.0000 mL | Freq: Once | INTRAVENOUS | Status: AC | PRN
  Administered 2021-09-09: 75 mL via INTRAVENOUS

## 2021-09-09 MED ORDER — SODIUM CHLORIDE 0.9 % IV BOLUS
1000.0000 mL | Freq: Once | INTRAVENOUS | Status: AC
  Administered 2021-09-09: 1000 mL via INTRAVENOUS

## 2021-09-09 MED ORDER — QUETIAPINE FUMARATE 100 MG PO TABS
300.0000 mg | ORAL_TABLET | Freq: Every day | ORAL | Status: DC
  Administered 2021-09-09: 300 mg via ORAL
  Filled 2021-09-09: qty 3

## 2021-09-09 MED ORDER — LATANOPROST 0.005 % OP SOLN
1.0000 [drp] | Freq: Every day | OPHTHALMIC | Status: DC
  Administered 2021-09-09: 1 [drp] via OPHTHALMIC
  Filled 2021-09-09: qty 2.5

## 2021-09-09 MED ORDER — BENZTROPINE MESYLATE 1 MG PO TABS
1.0000 mg | ORAL_TABLET | Freq: Two times a day (BID) | ORAL | Status: DC
Start: 2021-09-09 — End: 2021-09-10
  Administered 2021-09-09 – 2021-09-10 (×2): 1 mg via ORAL
  Filled 2021-09-09 (×4): qty 1

## 2021-09-09 MED ORDER — OLANZAPINE 5 MG PO TBDP
10.0000 mg | ORAL_TABLET | Freq: Every day | ORAL | Status: DC
  Administered 2021-09-09: 10 mg via ORAL
  Filled 2021-09-09: qty 2

## 2021-09-09 MED ORDER — HALOPERIDOL 5 MG PO TABS
10.0000 mg | ORAL_TABLET | Freq: Three times a day (TID) | ORAL | Status: DC
  Administered 2021-09-09 – 2021-09-10 (×2): 10 mg via ORAL
  Filled 2021-09-09 (×3): qty 2

## 2021-09-09 MED ORDER — DIVALPROEX SODIUM ER 500 MG PO TB24
500.0000 mg | ORAL_TABLET | Freq: Two times a day (BID) | ORAL | Status: DC
  Administered 2021-09-09 – 2021-09-10 (×2): 500 mg via ORAL
  Filled 2021-09-09 (×4): qty 1

## 2021-09-09 MED ORDER — BENZONATATE 100 MG PO CAPS
100.0000 mg | ORAL_CAPSULE | Freq: Once | ORAL | Status: AC
  Administered 2021-09-09: 100 mg via ORAL
  Filled 2021-09-09: qty 1

## 2021-09-09 MED ORDER — METFORMIN HCL 500 MG PO TABS: 500.0000 mg | ORAL_TABLET | Freq: Two times a day (BID) | ORAL | Status: DC

## 2021-09-09 MED ORDER — MOMETASONE FURO-FORMOTEROL FUM 200-5 MCG/ACT IN AERO
2.0000 | INHALATION_SPRAY | Freq: Two times a day (BID) | RESPIRATORY_TRACT | Status: DC
  Filled 2021-09-09: qty 8.8

## 2021-09-09 MED ORDER — VALBENAZINE TOSYLATE 40 MG PO CAPS
40.0000 mg | ORAL_CAPSULE | Freq: Every day | ORAL | Status: DC
  Administered 2021-09-10: 40 mg via ORAL
  Filled 2021-09-09 (×2): qty 1

## 2021-09-09 MED ORDER — METFORMIN HCL 500 MG PO TABS
500.0000 mg | ORAL_TABLET | Freq: Two times a day (BID) | ORAL | Status: DC
Start: 2021-09-11 — End: 2021-09-10

## 2021-09-09 NOTE — ED Triage Notes (Signed)
From group home, reports more confused than baseline.  Patient complainig of abd pain denies nausea

## 2021-09-09 NOTE — ED Notes (Signed)
Given 2 lunch bags and drinks.

## 2021-09-09 NOTE — ED Notes (Signed)
Stays at [facility] (Address: [address]) group home.

## 2021-09-09 NOTE — ED Provider Notes (Signed)
[facility] EMERGENCY DEPARTMENT Provider Note   CSN: [id number] Arrival date & time: 09/09/21  1410     History  Chief Complaint  Patient presents with   pain all over    Teresa Murillo is a 61 y.o. female with a past medical history significant for schizophrenia, COPD, previous aggressive behavior who presents with concern for confusion greater than baseline, on arrival patient had complained of some abdominal pain without nausea.  On my evaluation patient denies any headache, nausea, vomiting, abdominal pain, chest pain she does endorse some mild shortness of breath although she cannot tell me for how long.  Her thoughts are somewhat tangential, and she is endorsing some confusion on why she is in the emergency department.  Unclear on patient's story whether she presented herself or was brought by her group home.  She is currently not accompanied by anybody.  She reports that she takes a large list of medications to help with her schizophrenia and other medical problems, denies any change in her medication usage at this time.  She reports some increased appetite.  She denies any recent travel, hematemesis, previous history of DVT, PE.  Collateral: Care home reports insurance stopped previous medication, they have started a different medication. Patient with worsening schizophrenia -- hearing voices but without SI/HI. Monday she went to Dr, concerned about foot. Has been making frequent calls and trips to the ED. Patient saw therapist this morning -- they had added a new medication.   Was on caplyta, switched to [place], saw therapist today: dc seroquel, started lybalvi, clozapine. Has not started any of these medications.  HPI     Home Medications Prior to Admission medications   Medication Sig Start Date End Date Taking? Authorizing Provider  benztropine (COGENTIN) 1 MG tablet Take 1 mg by mouth in the morning and at bedtime.   Yes [provider]   budesonide-formoterol (SYMBICORT) 160-4.5 MCG/ACT inhaler Inhale 2 puffs into the lungs in the morning and at bedtime. 01/08/21  Yes [provider]  Cholecalciferol (VITAMIN D3) 1.25 MG (50000 UT) CAPS Take 50,000 Units by mouth every Thursday.   Yes [provider]  divalproex (DEPAKOTE ER) 500 MG 24 hr tablet Take 500 mg by mouth in the morning and at bedtime.   Yes [provider]  haloperidol (HALDOL) 10 MG tablet Take 10 mg by mouth 3 (three) times daily.   Yes [provider]  INGREZZA 40 MG capsule Take 40 mg by mouth in the morning.   Yes [provider]  latanoprost (XALATAN) 0.005 % ophthalmic solution Place 1 drop into both eyes at bedtime.   Yes [provider]  lisinopril (ZESTRIL) 10 MG tablet Take 10 mg by mouth daily.   Yes [provider]  metFORMIN (GLUCOPHAGE) 500 MG tablet Take 500 mg by mouth 2 (two) times daily with a meal.   Yes [provider]  nicotine (NICODERM CQ - DOSED IN MG/24 HR) 7 mg/24hr patch Place 7 mg onto the skin daily.   Yes [provider]  PROAIR HFA 108 (90 Base) MCG/ACT inhaler Inhale 2 puffs into the lungs every 6 (six) hours as needed for wheezing or shortness of breath.   Yes [provider]  QUEtiapine (SEROQUEL) 300 MG tablet Take 300 mg by mouth at bedtime.   Yes [provider]      Allergies    Penicillins    Review of Systems   Review of Systems  Respiratory:  Positive for shortness of breath.   All other systems reviewed and are negative.   Physical Exam Updated Vital Signs BP (!) 119/93   Pulse 100   Temp 98 F (36.7 C)   Resp 16   SpO2 100%  Physical Exam Vitals and nursing note reviewed.  Constitutional:      General: She is not in acute distress.    Appearance: Normal appearance.  HENT:     Head: Normocephalic and atraumatic.  Eyes:     General:        Right eye: No discharge.        Left eye: No discharge.   Cardiovascular:     Rate and Rhythm: Regular rhythm. Tachycardia present.     Heart sounds: No murmur heard.    No friction rub. No gallop.  Pulmonary:     Effort: Pulmonary effort is normal.     Breath sounds: Normal breath sounds.  Abdominal:     General: Bowel sounds are normal.     Palpations: Abdomen is soft.  Skin:    General: Skin is warm and dry.     Capillary Refill: Capillary refill takes less than 2 seconds.  Neurological:     Mental Status: She is alert and oriented to person, place, and time.  Psychiatric:        Mood and Affect: Mood normal.        Behavior: Behavior normal.     ED Results / Procedures / Treatments   Labs (all labs ordered are listed, but only abnormal results are displayed) Labs Reviewed  COMPREHENSIVE METABOLIC PANEL - Abnormal; Notable for the following components:      Result Value   Sodium 134 (*)    Glucose, Bld 107 (*)    Albumin 3.1 (*)    AST 13 (*)    Total Bilirubin 0.2 (*)    All other components within normal limits  CBC WITH DIFFERENTIAL/PLATELET - Abnormal; Notable for the following components:   Hemoglobin 11.8 (*)    RDW 17.8 (*)    Platelets 442 (*)    All other components within normal limits  AMMONIA - Abnormal; Notable for the following components:   Ammonia 37 (*)    All other components within normal limits  URINALYSIS, ROUTINE W REFLEX MICROSCOPIC - Abnormal; Notable for the following components:   Color, Urine COLORLESS (*)    Specific Gravity, Urine 1.002 (*)    All other components within normal limits  D-DIMER, QUANTITATIVE - Abnormal; Notable for the following components:   D-Dimer, Quant 0.92 (*)    All other components within normal limits  URINE CULTURE  RAPID URINE DRUG SCREEN, HOSP PERFORMED  ETHANOL  LIPASE, BLOOD  CBG MONITORING, ED  TROPONIN I (HIGH SENSITIVITY)    EKG EKG Interpretation  Date/Time:  Wednesday September 09 2021 14:54:56 EDT Ventricular Rate:  113 PR Interval:  138 QRS  Duration: 66 QT Interval:  296 QTC Calculation: 406 R Axis:   49 Text Interpretation: Sinus tachycardia Low voltage QRS ST elevations c/w early repol Confirmed by Sherwood Gambler [phone]) on 09/09/2021 5:19:41 PM  Radiology CT Angio Chest PE W and/or Wo Contrast  Result Date: 09/09/2021 CLINICAL DATA:  Pulmonary embolism (PE) suspected, positive D-dimer. Abdominal pain EXAM: CT ANGIOGRAPHY CHEST WITH CONTRAST TECHNIQUE: Multidetector CT imaging of the chest was performed using the standard protocol during bolus administration of intravenous contrast. Multiplanar CT image reconstructions and MIPs were obtained to evaluate the vascular anatomy. RADIATION DOSE  REDUCTION: This exam was performed according to the departmental dose-optimization program which includes automated exposure control, adjustment of the mA and/or kV according to patient size and/or use of iterative reconstruction technique. CONTRAST:  52mL OMNIPAQUE IOHEXOL 350 MG/ML SOLN COMPARISON:  None Available. FINDINGS: Cardiovascular: Cardiomegaly. Moderate pericardial effusion. Scattered coronary artery and aortic calcifications. No aneurysm. No filling defects in the pulmonary arteries to suggest pulmonary emboli. Mediastinum/Nodes: No mediastinal, hilar, or axillary adenopathy. Trachea and esophagus are unremarkable. Thyroid unremarkable. Moderate-sized hiatal hernia. Lungs/Pleura: No confluent opacities or effusions. Upper Abdomen: No acute findings Musculoskeletal: Chest wall soft tissues are unremarkable. No acute bony abnormality. Review of the MIP images confirms the above findings. IMPRESSION: No evidence of pulmonary embolus. Cardiomegaly.  Moderate pericardial effusion. Coronary artery disease. Moderate-sized hiatal hernia. Aortic Atherosclerosis (ICD10-I70.0). Electronically Signed   By: Charlett Nose M.D.   On: 09/09/2021 22:12   DG Ankle Complete Right  Result Date: 09/09/2021 CLINICAL DATA:  Right ankle pain. EXAM: RIGHT ANKLE -  COMPLETE 3+ VIEW COMPARISON:  October 21, 2020 FINDINGS: There is no evidence of fracture, dislocation, or joint effusion. Probable posttraumatic periosteal ossifications. Soft tissue swelling about the ankle. IMPRESSION: 1. No acute fracture or dislocation identified about the right ankle. 2. Soft tissue swelling about the ankle. Electronically Signed   By: Ted Mcalpine M.D.   On: 09/09/2021 16:22   CT HEAD WO CONTRAST ( )  Result Date: 09/09/2021 CLINICAL DATA:  Altered mental status EXAM: CT HEAD WITHOUT CONTRAST TECHNIQUE: Contiguous axial images were obtained from the base of the skull through the vertex without intravenous contrast. RADIATION DOSE REDUCTION: This exam was performed according to the departmental dose-optimization program which includes automated exposure control, adjustment of the mA and/or kV according to patient size and/or use of iterative reconstruction technique. COMPARISON:  11/01/2020 FINDINGS: Brain: The brainstem, cerebellum, cerebral peduncles, thalami, basal ganglia, basilar cisterns, and ventricular system appear within normal limits. No intracranial hemorrhage, mass lesion, or acute CVA. Vascular: There is atherosclerotic calcification of the cavernous carotid arteries bilaterally. Skull: Unremarkable Sinuses/Orbits: Periapical lucency along a right maxillary molar, image 1 series 4. 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 Other: No supplemental non-categorized findings. IMPRESSION: 1. No acute intracranial findings. 2. Atherosclerosis. 3. Mild chronic bilateral maxillary and ethmoid sinusitis. 4. Periapical lucency along a right maxillary molar, possibly an indicator of tooth decay. Electronically Signed   By: Gaylyn Rong M.D.   On: 09/09/2021 15:46    Procedures Procedures    Medications Ordered in ED Medications  OLANZapine zydis (ZYPREXA) disintegrating tablet 10 mg (10 mg Oral Given 09/09/21 2051)  benztropine (COGENTIN) tablet 1  mg (has no administration in time range)  mometasone-formoterol (DULERA) 200-5 MCG/ACT inhaler 2 puff (has no administration in time range)  divalproex (DEPAKOTE ER) 24 hr tablet 500 mg (has no administration in time range)  haloperidol (HALDOL) tablet 10 mg (has no administration in time range)  valbenazine (INGREZZA) capsule 40 mg (has no administration in time range)  latanoprost (XALATAN) 0.005 % ophthalmic solution 1 drop (has no administration in time range)  lisinopril (ZESTRIL) tablet 10 mg (has no administration in time range)  QUEtiapine (SEROQUEL) tablet 300 mg (has no administration in time range)  metFORMIN (GLUCOPHAGE) tablet 500 mg (has no administration in time range)  sodium chloride 0.9 % bolus 1,000 mL (1,000 mLs Intravenous New Bag/Given 09/09/21 1857)  iohexol (OMNIPAQUE) 350 MG/ML injection 75 mL (75 mLs Intravenous Contrast Given 09/09/21 2138)    ED Course/ Medical Decision  Making/ A&P Clinical Course as of 09/09/21 2324  Wed Sep 09, 2021  Daniel [CP]  2232 Clear for TTS eval [CP]  2313 I ordered her home meds at this time [CP]    Clinical Course User Index [CP] Anselmo Pickler, PA-C                           Medical Decision Making Amount and/or Complexity of Data Reviewed Labs: ordered. Radiology: ordered.  Risk Prescription drug management.   This patient is a 61 y.o. female who presents to the ED for concern of abnormal behavior per her group home, likely secondary to need for medication adjustment, medication changes, this involves an extensive number of treatment options, and is a complaint that carries with it a high risk of complications and morbidity. The emergent differential diagnosis prior to evaluation includes, but is not limited to, acute decompensation of schizophrenia with audiovisual hallucinations, plus or minus SI/HI.   This is not an exhaustive differential.   Past Medical History / Co-morbidities / Social  History: Previous history of schizophrenia, schizoaffective disorder, COPD, previous aggressive behavior, diabetes  Additional history: Chart reviewed. Pertinent results include: Reviewed [facility] visits over the last week, as well as previous emergency department lab work and imaging  Physical Exam: Physical exam performed. The pertinent findings include: Patient in no acute distress, however she is tachycardic with unclear reason.  She has some evidence of response to internal stimuli, she has tangential speech, she denies SI, HI at this time.  No other acute physical abnormalities noted.  Lab Tests: I ordered, and personally interpreted labs.  The pertinent results include: Patient with overall unremarkable CBC, very minimal anemia noted.  CMP is overall unremarkable.  UDS is negative.  Ammonia is minimally elevated at 37, do not think this is resolved for patient's behavior at this time.  Troponin is negative x1.  Lipase unremarkable.  UA is unremarkable.  D-dimer is somewhat elevated at 0.92, we will obtain CT angio chest PE with and without contrast.   Imaging Studies: I ordered imaging studies including CT PE study, plain film radiograph of the right ankle, CT head without contra. I independently visualized and interpreted imaging which showed no evidence of PE or other abnormality. I agree with the radiologist interpretation.   Cardiac Monitoring:  The patient was maintained on a cardiac monitor.  My attending physician Dr. Regenia Skeeter viewed and interpreted the cardiac monitored which showed an underlying rhythm of: Sinus tachycardia, some ST elevations that are consistent with early repole, no focal ST elevation, or evidence of ischemia. I agree with this interpretation.   Medications: I ordered medication including fluid bolus for tachycardia, Zyprexa for response to internal stimuli. Reevaluation of the patient after these medicines showed that the patient  improved. I have reviewed the patients home medicines and have made adjustments as needed.  Consultations Obtained: I requested consultation with the TTS regarding patient's schizophrenia, need for medication adjustment, possible hospitalization,  and discussed lab and imaging findings as well as pertinent plan we are pending their recommendations at this time.   Disposition: After consideration of the diagnostic results and the patients response to treatment, I feel that patient does need TTS eval for possible medication adjustments in lieu of abnormal behavior, ongoing AVH without SI, HI.  She does not meet criteria for IVC at this time, however I do think that she would benefit from medication  adjustment and psychiatric evaluation.  If patient decides to leave she is able to from my standpoint.  I discussed this case with my attending physician Dr. Criss Alvine who cosigned this note including patient's presenting symptoms, physical exam, and planned diagnostics and interventions. Attending physician stated agreement with plan or made changes to plan which were implemented.    Final Clinical Impression(s) / ED Diagnoses Final diagnoses:  None    Rx / DC Orders ED Discharge Orders     None         [place] 09/09/21 2324    Pricilla Loveless, MD 09/14/21 0710

## 2021-09-09 NOTE — ED Notes (Signed)
Pt no answer for VSx3

## 2021-09-09 NOTE — ED Provider Triage Note (Signed)
Emergency Medicine Provider Triage Evaluation Note  Ayda Tancredi , a 61 y.o. female  was evaluated in triage.  Pt complains of generalized abdominal pain and right ankle pain.  Patient was brought to the emergency department by EMS from patient's group home with reports of increased confusion.  Patient is alert to person, place, and time.  Patient reports that she ate pork chops for lunch and they were very greasy.  Patient states that she started having generalized abdominal pain at that time.  He complains of pain to right ankle.  Review of Systems  Positive: Confusion, abdominal pain, diarrhea Negative: Nausea, vomiting, dysuria, hematuria, urinary urgency, urinary frequency, vaginal pain, vaginal bleeding, vaginal discharge  Physical Exam  BP 117/80   Pulse (!) 114   Temp 98 F (36.7 C) (Oral)   Resp (!) 21   SpO2 97%  Gen:   Awake, no distress   Resp:  Normal effort  MSK:   Moves extremities without difficulty  Other:  Abdomen soft, nondistended, nontender with no guarding or rebound tenderness.  Medical Decision Making  Medically screening exam initiated at 3:04 PM.  Appropriate orders placed.  Chaunte Hornbeck was informed that the remainder of the evaluation will be completed by another provider, this initial triage assessment does not replace that evaluation, and the importance of remaining in the ED until their evaluation is complete.     Haskel Schroeder, [place] 09/09/21 1506

## 2021-09-10 ENCOUNTER — Other Ambulatory Visit: Payer: Self-pay | Admitting: Physician Assistant

## 2021-09-10 ENCOUNTER — Emergency Department (HOSPITAL_BASED_OUTPATIENT_CLINIC_OR_DEPARTMENT_OTHER): Payer: Medicaid Other

## 2021-09-10 DIAGNOSIS — I3139 Other pericardial effusion (noninflammatory): Secondary | ICD-10-CM

## 2021-09-10 LAB — URINE CULTURE: Culture: <10000 — AB

## 2021-09-10 LAB — ECHOCARDIOGRAM COMPLETE
Area-P 1/2: 5.58 cm2
S' Lateral: 2.3 cm

## 2021-09-10 NOTE — ED Notes (Addendum)
Teresa Murillo facility [facility] [phone] called for an update on patient.

## 2021-09-10 NOTE — BH Assessment (Signed)
TTS clinician attempted to complete assessment. 0400 Per RN, patient in hallway, no private room available. TTS  will attempt at later time when patient is able to be placed in a private room.

## 2021-09-10 NOTE — BH Assessment (Addendum)
Comprehensive Clinical Assessment (CCA) Note  09/10/2021 Teresa Murillo [id number]  Disposition: TTS assessment completed. I will have my notes in soon. Per Teresa Shoulder, NP, patient is psych cleared. Patient ok to discharge back to [facility] group home [phone]. Staff from the facility will pick patient up no later than 4pm. Disposition updates provided to patient's nurse.   Chief Complaint:  Chief Complaint  Patient presents with   pain all over   Psychiatric Evaluation   Visit Diagnosis: Schizophrenia  Teresa Murillo is a 61 y/o diagnosed with Paranoid Schizophrenia. She is presenting to [facility] stating she was having issues coinciding with her care takers. She currently resides at [facility], family Care home [phone]. She has lived at this home for several years. However, she doesn't know exactly how long.   Patient explains that her caretaker doesn't feel that she can care for herself. According to patient she is constantly being told she is doing things correctly or not abiding by the house rules. Patient says that she doesn't' like being made to feel that she is always doing things wrong. States that this makes her feel bad about herself. Patient acknowledges that she argues with her caregivers when she feels this way and can be verbally aggressive.  The following example was provided: Patient says that she went outside the other day. However, was not given permission to go outside and was told to come back in the home. Patient refused stating, "I should be able to go outside when I want to". Another example, patient was told to fold her clothing and put them away, however; she refused.   Patient denies current suicidal ideations. She also denies prior suicide attempt and/or gestures. Denies history of self-injurious behaviors. Patient with current depressive symptoms that include anger/irritability, fatigue, despondence, and insomnia. She sleeps 8  hours per night. Appetite is normal. No significant weight loss and/or gain.   Patient denies homicidal ideations. States that she denies a history of physical aggressive behaviors. However, has a history of becoming verbally aggressive in which she describes as being loud and yelling. She denies current AVH's. However, says that she has experienced visual hallucinations in the past. Denies paranoia.  She has a history of inpatient psychiatric treatment. The last admission was in 1991. Her outpatient psychiatrist (Dr. Harle Battiest) is at [facility] and she states that she is compliant with her medications.   Patient was seen face to face. On evaluation, she is alert and oriented X4. She is calm and cooperative; her mood is euthymic with congruent affect. She did not appear to be responding to any internal or external stimuli. No delusional thought content noted. She denies suicidal and homicidal ideations. She denies history of self-harming or suicidal attempts. She denies paranoia, auditory/visual hallucination, and substance use.   Per Collateral documentation noted on patient's H&P from the EDP:  "[facility] [phone] Care home reports insurance stopped previous medication, they have started a different medication. Patient with worsening schizophrenia -- hearing voices but without SI/HI. Monday she went to Dr, concerned about foot. Has been making frequent calls and trips to the ED. Patient saw therapist this morning -- they had added a new medication. Was on caplyta, switched to ingrezza, saw therapist today: dc seroquel, started lybalvi, clozapine. Has not started any of these medications.  CCA Screening, Triage and Referral (STR)  Patient Reported Information How did you hear about [place]? Family/Friend  What Is the Reason for Your Visit/Call  Today? Per Collateral documentation: "Care home reports insurance stopped previous medication, they have started a different medication. Patient with  worsening schizophrenia -- hearing voices but without SI/HI. Monday she went to Dr, concerned about foot. Has been making frequent calls and trips to the ED. Patient saw therapist this morning -- they had added a new medication. TTS will contact [facility] group home [phone] for additional collateral information. Was on caplyta, switched to ingrezza, saw therapist today: dc seroquel, started lybalvi, clozapine. Has not started any of these medications.  How Long Has This Been Causing You Problems? <Week  What Do You Feel Would Help You the Most Today? Medication(s); Treatment for Depression or other mood problem   Have You Recently Had Any Thoughts About Hurting Yourself? No  Are You Planning to Commit Suicide/Harm Yourself At This time? No   Have you Recently Had Thoughts About Hurting Someone Teresa Murillo? No  Are You Planning to Harm Someone at This Time? No  Explanation: No data recorded  Have You Used Any Alcohol or Drugs in the Past 24 Hours? No  How Long Ago Did You Use Drugs or Alcohol? No data recorded What Did You Use and How Much? No data recorded  Do You Currently Have a Therapist/Psychiatrist? No  Name of Therapist/Psychiatrist: No data recorded  Have You Been Recently Discharged From Any Office Practice or Programs? No  Explanation of Discharge From Practice/Program: No data recorded    CCA Screening Triage Referral Assessment Type of Contact: Tele-Assessment  Telemedicine Service Delivery: Telemedicine service delivery: This service was provided via telemedicine using a 2-way, interactive audio and video technology  Is this Initial or Reassessment? Initial Assessment  Date Telepsych consult ordered in [facility]:  09/10/21  Time Telepsych consult ordered in [facility]:  0053  Location of Assessment: [facility] ED  Provider Location: [facility] Assessment Services   Collateral Involvement: Group home staff member, per EDP note   Does Patient Have a Automotive engineer  Guardian? No data recorded Name and Contact of Legal Guardian: No data recorded If Minor and Not Living with Parent(s), Who has Custody? N/A  Is CPS involved or ever been involved? Never  Is APS involved or ever been involved? Never   Patient Determined To Be At Risk for Harm To Self or Others Based on Review of Patient Reported Information or Presenting Complaint? No  Method: No data recorded Availability of Means: No data recorded Intent: No data recorded Notification Required: No data recorded Additional Information for Danger to Others Potential: No data recorded Additional Comments for Danger to Others Potential: No data recorded Are There Guns or Other Weapons in Your Home? No data recorded Types of Guns/Weapons: No data recorded Are These Weapons Safely Secured?                            No data recorded Who Could Verify You Are Able To Have These Secured: No data recorded Do You Have any Outstanding Charges, Pending Court Dates, Parole/Probation? No data recorded Contacted To Inform of Risk of Harm To Self or Others: Other: Comment (N/A)    Does Patient Present under Involuntary Commitment? No  IVC Papers Initial File Date: No data recorded  [place] of Residence: [place]   Patient Currently Receiving the Following Services: Group Home (Dr. Guss Bunde)   Determination of Need: Routine (7 days)   Options For Referral: Outpatient Therapy; Medication Management     CCA Biopsychosocial Patient  Reported Schizophrenia/Schizoaffective Diagnosis in Past: Yes   Strengths: Pt is able to identify her thoughts and feelings. She answers the questions posed. Pt identifies that she likes the group home she resides at and that she is fed good food and has a nice house and room to live in.   Mental Health Symptoms Depression:   None   Duration of Depressive symptoms:    Mania:   None   Anxiety:    Difficulty concentrating   Psychosis:   None   Duration of Psychotic  symptoms:    Trauma:   N/A   Obsessions:   N/A   Compulsions:   None   Inattention:   Disorganized; Forgetful   Hyperactivity/Impulsivity:   N/A   Oppositional/Defiant Behaviors:   Aggression towards people/animals; Temper   Emotional Irregularity:   Mood lability; Chronic feelings of emptiness   Other Mood/Personality Symptoms:   None noted    Mental Status Exam Appearance and self-care  Stature:   Average   Weight:   Average weight   Clothing:   Age-appropriate   Grooming:   Normal   Cosmetic use:   None   Posture/gait:   Normal   Motor activity:   Not Remarkable   Sensorium  Attention:   Normal   Concentration:   Focuses on irrelevancies   Orientation:   X5   Recall/memory:   Normal   Affect and Mood  Affect:   Appropriate; Flat   Mood:   Euthymic   Relating  Eye contact:   Normal   Facial expression:   Responsive   Attitude toward examiner:   Cooperative   Thought and Language  Speech flow:  Clear and Coherent   Thought content:   Appropriate to Mood and Circumstances   Preoccupation:   None   Hallucinations:   None   Organization:  No data recorded  [employer] of Knowledge:   Poor   Intelligence:   Average   Abstraction:   Functional   Judgement:   Fair   Reality Testing:   Adequate   Insight:   Fair   Decision Making:   Impulsive   Social Functioning  Social Maturity:   Impulsive   Social Judgement:   Impropriety   Stress  Stressors:   -- (UTA)   Coping Ability:   -- Industrial/product designer)   Skill Deficits:   Decision making; Self-control   Supports:   Support needed     Religion: Religion/Spirituality Are You A Religious Person?: No How Might This Affect Treatment?: Not assessed  Leisure/Recreation: Leisure / Recreation Do You Have Hobbies?: No  Exercise/Diet: Exercise/Diet Do You Exercise?: No Have You Gained or Lost A Significant Amount of Weight in the Past Six  Months?: No Do You Follow a Special Diet?: No Do You Have Any Trouble Sleeping?: Yes Explanation of Sleeping Difficulties: Pt states she has difficulties sleeping at times.   CCA Employment/Education Employment/Work Situation: Employment / Work Situation Employment Situation: Unemployed Patient's Job has Been Impacted by Current Illness: No Has Patient ever Been in Equities trader?: No  Education: Education Is Patient Currently Attending School?: No Did Theme park manager?: No Did You Have An Individualized Education Program (IIEP): No Did You Have Any Difficulty At [employer]?: No Patient's Education Has Been Impacted by Current Illness: No   CCA Family/Childhood History Family and Relationship History: Family history Marital status: Single Does patient have children?: Yes How many children?: 1 How is patient's relationship with their children?: Pt  states she has a daughter whom was given up for adoption.  Childhood History:  Childhood History Did patient suffer any verbal/emotional/physical/sexual abuse as a child?: No Did patient suffer from severe childhood neglect?: No Has patient ever been sexually abused/assaulted/raped as an adolescent or adult?: No Was the patient ever a victim of a crime or a disaster?: No Witnessed domestic violence?: No Has patient been affected by domestic violence as an adult?: No  Child/Adolescent Assessment:     CCA Substance Use Alcohol/Drug Use: Alcohol / Drug Use Pain Medications: See MAR Prescriptions: See MAR Over the Counter: See MAR History of alcohol / drug use?: No history of alcohol / drug abuse Longest period of sobriety (when/how long): N/A                         ASAM's:  Six Dimensions of Multidimensional Assessment  Dimension 1:  Acute Intoxication and/or Withdrawal Potential:      Dimension 2:  Biomedical Conditions and Complications:      Dimension 3:  Emotional, Behavioral, or Cognitive Conditions and  Complications:     Dimension 4:  Readiness to Change:     Dimension 5:  Relapse, Continued use, or Continued Problem Potential:     Dimension 6:  Recovery/Living Environment:     ASAM Severity Score:    ASAM Recommended Level of Treatment:     Substance use Disorder (SUD)    Recommendations for Services/Supports/Treatments: Recommendations for Services/Supports/Treatments Recommendations For Services/Supports/Treatments: Medication Management, Individual Therapy, Other (Comment)  Discharge Disposition:    DSM5 Diagnoses: Patient Active Problem List   Diagnosis Date Noted   Aggressive behavior 10/21/2020   Schizophrenia (HCC) 06/02/2020   COPD (chronic obstructive pulmonary disease) (HCC) 06/02/2020   Broken ankle 06/02/2020     Referrals to Alternative Service(s): Referred to Alternative Service(s):   Place:   Date:   Time:    Referred to Alternative Service(s):   Place:   Date:   Time:    Referred to Alternative Service(s):   Place:   Date:   Time:    Referred to Alternative Service(s):   Place:   Date:   Time:     Melynda Ripple, Counselor

## 2021-09-10 NOTE — ED Provider Notes (Signed)
  Physical Exam  BP 135/83 (BP Location: Left Arm)   Pulse (!) 101   Temp 97.7 F (36.5 C) (Oral)   Resp 18   SpO2 97%   Physical Exam Vitals and nursing note reviewed.  Constitutional:      General: She is not in acute distress.    Appearance: She is well-developed.  HENT:     Head: Normocephalic and atraumatic.  Eyes:     Conjunctiva/sclera: Conjunctivae normal.  Cardiovascular:     Rate and Rhythm: Normal rate and regular rhythm.     Heart sounds: No murmur heard. Pulmonary:     Effort: Pulmonary effort is normal. No respiratory distress.  Musculoskeletal:        General: No swelling.     Cervical back: Neck supple.  Skin:    General: Skin is warm and dry.     Capillary Refill: Capillary refill takes less than 2 seconds.  Neurological:     Mental Status: She is alert.  Psychiatric:        Mood and Affect: Mood normal.     Procedures  Procedures  ED Course / MDM   Clinical Course as of 09/10/21 1630  Wed Sep 09, 2021  1811 Bethanne Ginger Care [CP]  2232 Clear for TTS eval [CP]  2313 I ordered her home meds at this time [CP]  Thu Sep 10, 2021  1507 Pending echo and dc [MK]    Clinical Course User Index [CP] Olene Floss, PA-C [MK] Joanne Salah, Wyn Forster, MD   Medical Decision Making Amount and/or Complexity of Data Reviewed Labs: ordered. Radiology: ordered.  Risk Prescription drug management.   Patient received in handoff.  Patient psychiatrically clear but is pending an echo given CT concerns for pericardial effusion.  Echocardiogram obtained that shows a moderate-sized pericardial effusion.  I consulted cardiology and spoke with Dr. Rennis Golden who states that the patient will need a 1 week follow-up with cardiology for repeat ultrasonography to monitor for size.  Patient is asymptomatic from this finding today and it appears to be incidental.  I spoke with the transition of care team who will call the group home to ensure that the patient is able to make  this appointment.  Patient then discharged with outpatient cardiology follow-up       Glendora Score, MD 09/10/21 [phone]

## 2021-09-10 NOTE — BH Assessment (Addendum)
@  1122, Contacted Anna, EMT and requested the TTS machine to be set up for patient's initial TTS assessment.

## 2021-09-10 NOTE — ED Notes (Signed)
Caretaker from group home comes to transport patient. Report given. Paperwork given to caretaker. Patient walked to car.

## 2021-09-10 NOTE — Progress Notes (Signed)
   Asked to review an echocardiogram performed today which indicates a moderate size pericardial effusion with normal LV function no evidence of any tamponade physiology with a small IVC that collapses.  Ms. Teresa Murillo had an outpatient CT angiogram to rule out pulmonary embolus which indicated a pericardial effusion yesterday.  Other than this she has been asymptomatic other than some mild shortness of breath.  After discussion with the emergency physician he feels that there are no signs or symptoms of any tamponade physiology or clear symptoms related to this pericardial effusion.  We do not know how long this may have been present or whether it will change in size.  I will need serial imaging.  I do not feel that she meets requirements to be admitted for this.  Would otherwise recommend a repeat echocardiogram early next week.  We will go ahead and arrange that.  I am told that she lives in a group home due to her psychiatric history but could likely get transportation arranged.  We will arrange for follow-up afterwards and cardiology.  She will be advised to present to the ER if she has any worsening shortness of breath, chest pain or associated symptoms.  Chrystie Nose, MD, [facility], FACP  [place]  [facility]  Medical Director of the Advanced Lipid Disorders &  Cardiovascular Risk Reduction Clinic Diplomate of the American Board of Clinical Lipidology Attending Cardiologist  Direct Dial: [phone]  Fax: [phone]  Website:  [email or web address]

## 2021-09-10 NOTE — ED Provider Notes (Addendum)
Emergency Medicine Observation Re-evaluation Note  Teresa Murillo is a 61 y.o. female, seen on rounds today.  Pt initially presented to the ED for complaints of ED evaluation.  Pt currently is ambulatory about dept. No distress. Pt inquiring about when lunch will be coming.   Physical Exam  BP 140/85 (BP Location: Left Arm)   Pulse (!) 125   Temp 97.7 F (36.5 C) (Oral)   Resp 16   SpO2 93%  Physical Exam General: alert, content, no acute distress. Cardiac: regular rate (currently pulse rate 98) Lungs: breathing comfortably.  Psych: normal mood and affect. Pleasant/conversant. Pt does not appear acutely depressed or despondent. Pt is not currently responding to internal stimuli - no delusion or hallucinations are noted. Pt denies thought of harm to self or others. Pt does acknowledge periodic disagreement with group home staff when rules are imposed - pt is encouraged to be cooperative and respectful - pt indicates she will try.   ED Course / MDM    I have reviewed the labs performed to date as well as medications administered while in observation.  Recent changes in the last 24 hours include ED obs, reassessment.   Plan    [place] is not under involuntary commitment.  Pt w normal mood/affect, cooperative, conversant, no acute psychosis noted, no thoughts of harm to self or others.   It appears pt is closely followed by [facility], and does having upcoming appt 7/18. From [facility] standpoint, appears stable for return to group home.   Pt currently denies any physical c/o, abd soft non tender, chest cta, rrr.   Of note, pt has been noted to be mildly tachycardic in ED, and mild sob w exertion. Patients recent chest imaging showed no PE, but did show a moderate pericardial effusion - will consult/discuss w cardiology.   Discussed pt with cardiology/Trish - indicates to get ECHO and then cardiology will make recommendation.         Cathren Laine, MD 09/10/21 1349

## 2021-09-10 NOTE — ED Notes (Signed)
Changed into purple scrubs. Belongings place in locker #8. Wanded by security. Remains calm and cooperative.

## 2021-09-13 ENCOUNTER — Ambulatory Visit (HOSPITAL_COMMUNITY)
Admission: EM | Admit: 2021-09-13 | Discharge: 2021-09-13 | Disposition: A | Discharge status: Home / Self Care | Payer: No Typology Code available for payment source | Attending: Psychiatry | Admitting: Psychiatry

## 2021-09-13 DIAGNOSIS — F69 Unspecified disorder of adult personality and behavior: Secondary | ICD-10-CM

## 2021-09-13 DIAGNOSIS — R4689 Other symptoms and signs involving appearance and behavior: Secondary | ICD-10-CM | POA: Insufficient documentation

## 2021-09-13 NOTE — ED Notes (Signed)
Patient given AVS instructions and verbalizes understanding - patient walked out of lobby with no sxs of obvious distress

## 2021-09-13 NOTE — Discharge Instructions (Signed)
The suicide prevention education provided includes the following: Suicide risk factors Suicide prevention and interventions National Suicide Hotline telephone number [facility] assessment telephone number [place] Emergency Assistance 911 County and/or Residential Mobile Crisis Unit telephone number   Request made of family/significant other to: Remove weapons (e.g., guns, rifles, knives), all items previously/currently identified as safety concern.   Remove drugs/medications (over the counter, prescriptions, illicit drugs), all items previously/currently identified as a safety concern.  

## 2021-09-13 NOTE — BH Assessment (Addendum)
Pt reports she was talking to her therapist and told her she was not feeling well. TTS had client to clarify "not feeling well" and pt reports feeling nauseas, hungry, and sleepy. Patient also reports a "falling out" with staff at the group home. Pt reports staff told her that she was too pushy and act like she does not care about anything. Pt reports staff also told her that she was going to get rid of her because she is not doing right by breaking the house rules and acting out. Pt not sharing what she did to break rules. Pt reports she is sleepy because she was up all last night. Pt denies SI, HI, AVH and substance use.     TTS spoke to [employer] owner of [facility] group home. [name] reports that these same issues have been going on with patient for the last 2-3 weeks and she has been to the ED multiple times. Patient is reporting hearing voices telling her to do bad things. Pt diagnosed with IDD and schizophrenia. [name] reports today patient reported hearing voices telling her to do bad things and patient reported she wanted to go to the ED. [name] stated that she tried to de-escalate pt for two hours, but it did not help and pt wanted her to call police. [name] stated, "something going on" and [facility] keeps sending pt back.  [name] reports that pt is now walking away from the facility and is a danger to self. Today pt took off all her clothes and was walking around naked. [name] denies having argument with pt today and the only issues is that pt can't have caffeine or soda per doc and she is upset about it. Pt asked for soda and staff said no. [name] states that pt told her that she was not feeling good and can't get thing straight in her head.  [name] reports that pt did not sleep well last night and was up and down all last night. [name] also reports that pt told staff that she wanted to die and said "go ahead and kill me. I don't want to live anymore". Patient is her own  guardian.

## 2021-09-13 NOTE — ED Provider Notes (Signed)
Behavioral Health Urgent Care Medical Screening Exam  Patient Name: Teresa Murillo MRN: [id number] Date of Evaluation: 09/13/21 Chief Complaint:   Diagnosis:  Final diagnoses:  Behavior concern in adult    History of Present illness: Teresa Murillo is a 61 y.o. female patient presented to [facility] as a walk in via [employer] at patient's request because she got frustrated at her group home.  Teresa Murillo, 61 y.o., female patient seen face to face by this provider, consulted with Dr. Lucianne Muss; and chart reviewed on 09/13/21.  Patient reports she has services in place with [facility] Dr. Elizabeth Sauer.  She reports compliance with medications but does not know the names of her medications.   On evaluation Teresa Murillo reports she was not feeling well today.  When asked to clarify she states, "I have not been doing what they tell me to do and get in trouble".  Reports today she called the police to come get her and to bring her here because she was tired and didn't get food she ask for.  Reports she is not happy in her group home because they do not give her food when she asked for it and they have taken her caffeine and sugar away.  When asking patient what would make her happy about returning to her group home she states, "maybe if they just give me the food I want to eat".  After patient was provided with graham crackers and decaffeinated coffee she stated she was ready to return back to the group home.  Patient denied allegations made by owner of the group home provided to TTS counselor.  Patient denies any substance use.  During evaluation Teresa Murillo observed laying in the assessment room asleep.  She is easily awakened.  She is alert/oriented x4 and cooperative.  She is disheveled and makes fair eye contact.  Her speech is clear, coherent, normal rate and tone.  She is somewhat limited.  She has a euthymic affect and is asking for something to eat.  She denies depression.  Reports that she stayed awake all  night last night and she is sleepy today.  She denies SI/HI/AVH.  She contracts for safety.  She does not appear to be responding to internal/external stimuli.  [facility] [name] [phone] -contacted [name] to inform her patient has been discharged.  She reports all staff members will be unavailable until 7 PM, at that time staff will pick patient up from facility.  Per TTS note Felencio Janee [facility] "TTS spoke to [employer] owner of [facility]. [name] reports that these same issues have been going on with patient for the last 2-3 weeks and she has been to the ED multiple times. Patient is reporting hearing voices telling her to do bad things. Pt diagnosed with IDD and schizophrenia. [name] reports today patient reported hearing voices telling her to do bad things and patient reported she wanted to go to the ED. [name] stated that she tried to de-escalate pt for two hours, but it did not help and pt wanted her to call police. [name] stated, "something going on" and [facility] keeps sending pt back.  [name] reports that pt is now walking away from the facility and is a danger to self. Today pt took off all her clothes and was walking around naked. [name] denies having argument with pt today and the only issues is that pt can't have caffeine or soda per doc and she is upset about it. Pt  asked for soda and staff said no. [name] states that pt told her that she was not feeling good and can't get thing straight in her head.  [name] reports that pt did not sleep well last night and was up and down all last night. [name] also reports that pt told staff that she wanted to die and said "go ahead and kill me. I don't want to live anymore". Patient is her own guardian".     Psychiatric Specialty Exam  Presentation  General Appearance:Disheveled  Eye Contact:Good  Speech:Clear and Coherent; Normal Rate  Speech Volume:Normal  Handedness:Right   Mood  and Affect  Mood:Euthymic  Affect:Congruent   Thought Process  Thought Processes:Coherent  Descriptions of Associations:Intact  Orientation:Full (Time, Place and Person)  Thought Content:Logical  Diagnosis of Schizophrenia or Schizoaffective disorder in past: Yes  Duration of Psychotic Symptoms: Greater than six months  Hallucinations:None  Ideas of Reference:None  Suicidal Thoughts:No  Homicidal Thoughts:No   Sensorium  Memory:Immediate Fair; Recent Fair; Remote Fair  Judgment:Fair  Insight:Fair   Executive Functions  Concentration:Good  Attention Span:Good  Recall:Good  Fund of Knowledge:Good  Language:Good   Psychomotor Activity  Psychomotor Activity:Normal   Assets  Assets:Communication Skills; Desire for Improvement; Physical Health; Housing   Sleep  Sleep:Good  Number of hours: 6   No data recorded  Physical Exam: Physical Exam Vitals and nursing note reviewed.  Constitutional:      General: She is not in acute distress.    Appearance: Normal appearance. She is not ill-appearing.  HENT:     Head: Normocephalic.  Eyes:     General:        Right eye: No discharge.        Left eye: No discharge.  Cardiovascular:     Rate and Rhythm: Normal rate.  Pulmonary:     Effort: Pulmonary effort is normal.  Musculoskeletal:        General: Normal range of motion.     Cervical back: Normal range of motion.  Skin:    Coloration: Skin is not jaundiced or pale.  Neurological:     Mental Status: She is alert and oriented to person, place, and time.  Psychiatric:        Attention and Perception: Attention and perception normal.        Mood and Affect: Mood normal.        Speech: Speech normal.        Behavior: Behavior normal. Behavior is cooperative.        Thought Content: Thought content normal.        Cognition and Memory: Cognition normal.        Judgment: Judgment is impulsive.    Review of Systems  Constitutional: Negative.    HENT: Negative.    Eyes: Negative.   Respiratory: Negative.    Cardiovascular: Negative.   Musculoskeletal: Negative.   Skin: Negative.   Neurological: Negative.   Psychiatric/Behavioral: Negative.     Blood pressure 121/77, temperature 98.4 F (36.9 C), temperature source Oral, resp. rate 18, SpO2 96 %. There is no height or weight on file to calculate BMI.  Musculoskeletal: Strength & Muscle Tone: within normal limits Gait & Station: normal Patient leans: N/A   [facility] MSE Discharge Disposition for Follow up and Recommendations: Based on my evaluation the patient does not appear to have an emergency medical condition and can be discharged with resources and follow up care in outpatient services for Medication Management and Individual Therapy  Discharge  patient  Follow-up with [facility] Dr. Elizabeth Sauer asap to report patient's behaviors.  No evidence of imminent risk to self or others at present.    Patient does not meet criteria for psychiatric inpatient admission. Discussed crisis plan, support from social network, calling 911, coming to the Emergency Department, and calling Suicide Hotline.   Ardis Hughs, NP 09/13/2021, 5:09 PM

## 2021-09-22 ENCOUNTER — Ambulatory Visit (HOSPITAL_COMMUNITY): Payer: Medicaid Other | Attending: Internal Medicine

## 2021-09-24 ENCOUNTER — Telehealth (HOSPITAL_COMMUNITY): Payer: Self-pay | Admitting: Physician Assistant

## 2021-09-24 NOTE — Telephone Encounter (Signed)
Patient No Showed echocardiogram. I called to reschedule and spoke with the facility that takes care of the patient and they will call us back to schedule when they can bring her. See below:  09/22/21 NO SHOWED ECHO and I spoke w facility she stays at and they will call back to reschedule when they can see schedule/LBW 3:37  Order will be removed from the WQ and when they call back we will reinstate the order.

## 2021-09-25 ENCOUNTER — Ambulatory Visit: Payer: Medicaid Other | Admitting: Internal Medicine

## 2021-09-30 ENCOUNTER — Encounter: Payer: Medicaid Other | Admitting: Obstetrics and Gynecology

## 2021-10-04 ENCOUNTER — Ambulatory Visit (HOSPITAL_COMMUNITY)
Admission: EM | Admit: 2021-10-04 | Discharge: 2021-10-22 | Disposition: A | Discharge status: Home / Self Care | Payer: No Typology Code available for payment source | Attending: Family | Admitting: Family

## 2021-10-04 DIAGNOSIS — F209 Schizophrenia, unspecified: Secondary | ICD-10-CM | POA: Diagnosis not present

## 2021-10-04 DIAGNOSIS — F69 Unspecified disorder of adult personality and behavior: Secondary | ICD-10-CM

## 2021-10-04 DIAGNOSIS — Z20822 Contact with and (suspected) exposure to covid-19: Secondary | ICD-10-CM | POA: Diagnosis not present

## 2021-10-04 LAB — COMPREHENSIVE METABOLIC PANEL
ALT: 10 U/L (ref 0–44)
AST: 13 U/L — ABNORMAL LOW (ref 15–41)
Albumin: 3.2 g/dL — ABNORMAL LOW (ref 3.5–5.0)
Alkaline Phosphatase: 61 U/L (ref 38–126)
Anion gap: 7 (ref 5–15)
BUN: 9 mg/dL (ref 6–20)
CO2: 25 mmol/L (ref 22–32)
Calcium: 9 mg/dL (ref 8.9–10.3)
Chloride: 104 mmol/L (ref 98–111)
Creatinine, Ser: 0.52 mg/dL (ref 0.44–1.00)
GFR, Estimated: >60 mL/min (ref >60)
Glucose, Bld: 100 mg/dL — ABNORMAL HIGH (ref 70–99)
Potassium: 4.2 mmol/L (ref 3.5–5.1)
Sodium: 136 mmol/L (ref 135–145)
Total Bilirubin: 0.3 mg/dL (ref 0.3–1.2)
Total Protein: 7 g/dL (ref 6.5–8.1)

## 2021-10-04 LAB — CBC WITH DIFFERENTIAL/PLATELET
Abs Immature Granulocytes: 0.03 10*3/uL (ref 0.00–0.07)
Basophils Absolute: 0.1 10*3/uL (ref 0.0–0.1)
Basophils Relative: 1 %
Eosinophils Absolute: 0.1 10*3/uL (ref 0.0–0.5)
Eosinophils Relative: 1 %
HCT: 35.7 % — ABNORMAL LOW (ref 36.0–46.0)
Hemoglobin: 11.6 g/dL — ABNORMAL LOW (ref 12.0–15.0)
Immature Granulocytes: 0 %
Lymphocytes Relative: 22 %
Lymphs Abs: 1.8 10*3/uL (ref 0.7–4.0)
MCH: 26.2 pg (ref 26.0–34.0)
MCHC: 32.5 g/dL (ref 30.0–36.0)
MCV: 80.8 fL (ref 80.0–100.0)
Monocytes Absolute: 0.7 10*3/uL (ref 0.1–1.0)
Monocytes Relative: 8 %
Neutro Abs: 5.7 10*3/uL (ref 1.7–7.7)
Neutrophils Relative %: 68 %
Platelets: 372 10*3/uL (ref 150–400)
RBC: 4.42 MIL/uL (ref 3.87–5.11)
RDW: 16 % — ABNORMAL HIGH (ref 11.5–15.5)
WBC: 8.4 10*3/uL (ref 4.0–10.5)
nRBC: 0 % (ref 0.0–0.2)

## 2021-10-04 LAB — POCT URINE DRUG SCREEN - MANUAL ENTRY (I-SCREEN)
POC Amphetamine UR: NOT DETECTED
POC Buprenorphine (BUP): NOT DETECTED
POC Cocaine UR: NOT DETECTED
POC Marijuana UR: NOT DETECTED
POC Methadone UR: NOT DETECTED
POC Methamphetamine UR: NOT DETECTED
POC Morphine: NOT DETECTED
POC Oxazepam (BZO): NOT DETECTED
POC Oxycodone UR: POSITIVE — AB
POC Secobarbital (BAR): NOT DETECTED

## 2021-10-04 LAB — HEMOGLOBIN A1C
Hgb A1c MFr Bld: 7 % — ABNORMAL HIGH (ref 4.8–5.6)
Mean Plasma Glucose: 154.2 mg/dL

## 2021-10-04 LAB — RESP PANEL BY RT-PCR (FLU A&B, COVID) ARPGX2
Influenza A by PCR: NEGATIVE
Influenza B by PCR: NEGATIVE
SARS Coronavirus 2 by RT PCR: NEGATIVE

## 2021-10-04 LAB — POC SARS CORONAVIRUS 2 AG: SARSCOV2ONAVIRUS 2 AG: NEGATIVE

## 2021-10-04 LAB — LIPID PANEL
Cholesterol: 178 mg/dL (ref 0–200)
HDL: 52 mg/dL (ref >40)
LDL Cholesterol: 95 mg/dL (ref 0–99)
Total CHOL/HDL Ratio: 3.4 RATIO
Triglycerides: 155 mg/dL — ABNORMAL HIGH (ref <150)
VLDL: 31 mg/dL (ref 0–40)

## 2021-10-04 LAB — VALPROIC ACID LEVEL: Valproic Acid Lvl: 51 ug/mL (ref 50.0–100.0)

## 2021-10-04 MED ORDER — HALOPERIDOL 5 MG PO TABS
10.0000 mg | ORAL_TABLET | Freq: Three times a day (TID) | ORAL | Status: DC
  Administered 2021-10-04 – 2021-10-05 (×3): 10 mg via ORAL
  Filled 2021-10-04 (×3): qty 2

## 2021-10-04 MED ORDER — ACETAMINOPHEN 325 MG PO TABS
650.0000 mg | ORAL_TABLET | Freq: Four times a day (QID) | ORAL | Status: DC | PRN
  Administered 2021-10-10 – 2021-10-21 (×11): 650 mg via ORAL
  Filled 2021-10-04 (×11): qty 2

## 2021-10-04 MED ORDER — LISINOPRIL 10 MG PO TABS: 10.0000 mg | ORAL_TABLET | Freq: Every day | ORAL | Status: DC

## 2021-10-04 MED ORDER — LATANOPROST 0.005 % OP SOLN
1.0000 [drp] | Freq: Every day | OPHTHALMIC | Status: DC
  Administered 2021-10-05 – 2021-10-21 (×18): 1 [drp] via OPHTHALMIC
  Filled 2021-10-04: qty 2.5

## 2021-10-04 MED ORDER — BENZTROPINE MESYLATE 1 MG PO TABS
1.0000 mg | ORAL_TABLET | Freq: Two times a day (BID) | ORAL | Status: DC
  Administered 2021-10-04 – 2021-10-22 (×36): 1 mg via ORAL
  Filled 2021-10-04 (×36): qty 1

## 2021-10-04 MED ORDER — NICOTINE 7 MG/24HR TD PT24
7.0000 mg | MEDICATED_PATCH | Freq: Every day | TRANSDERMAL | Status: DC
  Administered 2021-10-05 – 2021-10-22 (×18): 7 mg via TRANSDERMAL
  Filled 2021-10-04 (×18): qty 1

## 2021-10-04 MED ORDER — ALUM & MAG HYDROXIDE-SIMETH 200-200-20 MG/5ML PO SUSP: 30.0000 mL | ORAL | Status: DC | PRN

## 2021-10-04 MED ORDER — CLOZAPINE 25 MG PO TABS
50.0000 mg | ORAL_TABLET | Freq: Two times a day (BID) | ORAL | Status: DC
  Administered 2021-10-04 – 2021-10-22 (×36): 50 mg via ORAL
  Filled 2021-10-04 (×36): qty 2

## 2021-10-04 MED ORDER — LISINOPRIL 10 MG PO TABS
10.0000 mg | ORAL_TABLET | Freq: Every day | ORAL | Status: DC
  Administered 2021-10-05 – 2021-10-22 (×18): 10 mg via ORAL
  Filled 2021-10-04 (×18): qty 1

## 2021-10-04 MED ORDER — MOMETASONE FURO-FORMOTEROL FUM 200-5 MCG/ACT IN AERO
2.0000 | INHALATION_SPRAY | Freq: Two times a day (BID) | RESPIRATORY_TRACT | Status: DC
  Administered 2021-10-04 – 2021-10-22 (×36): 2 via RESPIRATORY_TRACT
  Filled 2021-10-04 (×3): qty 8.8

## 2021-10-04 MED ORDER — DIVALPROEX SODIUM ER 500 MG PO TB24
500.0000 mg | ORAL_TABLET | Freq: Two times a day (BID) | ORAL | Status: DC
  Administered 2021-10-04 – 2021-10-22 (×36): 500 mg via ORAL
  Filled 2021-10-04 (×36): qty 1

## 2021-10-04 MED ORDER — VALBENAZINE TOSYLATE 40 MG PO CAPS
40.0000 mg | ORAL_CAPSULE | Freq: Every morning | ORAL | Status: DC
  Administered 2021-10-05 – 2021-10-22 (×18): 40 mg via ORAL
  Filled 2021-10-04 (×18): qty 1

## 2021-10-04 MED ORDER — METFORMIN HCL 500 MG PO TABS
500.0000 mg | ORAL_TABLET | Freq: Two times a day (BID) | ORAL | Status: DC
  Administered 2021-10-04 – 2021-10-22 (×36): 500 mg via ORAL
  Filled 2021-10-04 (×36): qty 1

## 2021-10-04 MED ORDER — MAGNESIUM HYDROXIDE 400 MG/5ML PO SUSP: 30.0000 mL | Freq: Every day | ORAL | Status: DC | PRN

## 2021-10-04 NOTE — BH Assessment (Addendum)
Comprehensive Clinical Assessment (CCA) Note  10/04/2021 Teresa Murillo [id number] DISPOSITION: [facility] NP reports patient's disposition is routine although patient signed a voluntary dismissal form at her group home today and now is without housing. Patient denies any S/I, H/I or AVH.    [facility] from 10/04/2021 in [facility] ED from 09/09/2021 in [place] ED from 08/22/2021 in [place] [facility] Dept  C-SSRS RISK CATEGORY No Risk No Risk No Risk       The patient demonstrates the following risk factors for suicide: Chronic risk factors for suicide include: N/A. Acute risk factors for suicide include: N/A. Protective factors for this patient include: coping skills. Considering these factors, the overall suicide risk at this point appears to be low. Patient is appropriate for outpatient follow up.   Patient is a 61 year old female with a history significant for Schizophrenia and IDD although she is her own guardian. Patient denies any S/I, H/I or AVH. Patient has been residing at a group home [facility]) until today when she reported she signed a voluntary form to leave that facility due to her ongoing behavioral issues and multiple altercations with staff. Patient states "she just needs to be somewhere else" although her insight is limited in reference to where she is planning to go. Patient does appear somewhat remorseful in reference to leaving that residence. Patient per chart review has had multiple altercations with staff and has been seen before presenting with similar issues. Patient is followed by [facility] where she receives medication management. Patient reports current compliance. Patient states that today after they "were mean to her" she became upset and "got into an argument" with staff who ask her to leave which she agreed to terminating her agreement and signing a voluntary consent  to leave the property. At this time patient does not have any other place to go. [facility] NP saw patient on arrival and recommended patient stay overnight in observation so SW could assist tomorrow with other placement. Patient's disposition was listed as Routine although CCA was completed.          Chief Complaint: No chief complaint on file.  Visit Diagnosis: Schizophrenia, IDD     CCA Screening, Triage and Referral (STR)  Patient Reported Information How did you hear about [place]? Self  What Is the Reason for Your Visit/Call Today? Pt presents after being discharged from her group home  How Long Has This Been Causing You Problems? <Week  What Do You Feel Would Help You the Most Today? Social Support   Have You Recently Had Any Thoughts About Hurting Yourself? No  Are You Planning to Commit Suicide/Harm Yourself At This time? No   Have you Recently Had Thoughts About [place]? No  Are You Planning to Harm Someone at This Time? No  Explanation: No data recorded  Have You Used Any Alcohol or Drugs in the Past 24 Hours? No  How Long Ago Did You Use Drugs or Alcohol? No data recorded What Did You Use and How Much? No data recorded  Do You Currently Have a Therapist/Psychiatrist? Yes  Name of Therapist/Psychiatrist: [facility]   Have You Been Recently Discharged From Any Office Practice or Programs? No  Explanation of Discharge From Practice/Program: No data recorded    CCA Screening Triage Referral Assessment Type of Contact: Face-to-Face  Telemedicine Service Delivery:   Is this Initial or Reassessment? Initial Assessment  Date  Telepsych consult ordered in [facility]:  09/10/21  Time Telepsych consult ordered in [facility]:  0053  Location of Assessment: [facility] Services  Provider Location: [facility] Assessment Services   Collateral Involvement: None at this time   Does Patient Have a Stage manager Guardian? No data recorded Name and Contact of  Legal Guardian: No data recorded If Minor and Not Living with Parent(s), Who has Custody? NA  Is CPS involved or ever been involved? Never  Is APS involved or ever been involved? Never   Patient Determined To Be At Risk for Harm To Self or Others Based on Review of Patient Reported Information or Presenting Complaint? No  Method: No data recorded Availability of Means: No data recorded Intent: No data recorded Notification Required: No data recorded Additional Information for Danger to Others Potential: No data recorded Additional Comments for Danger to Others Potential: No data recorded Are There Guns or Other Weapons in Your Home? No data recorded Types of Guns/Weapons: No data recorded Are These Weapons Safely Secured?                            No data recorded Who Could Verify You Are Able To Have These Secured: No data recorded Do You Have any Outstanding Charges, Pending Court Dates, Parole/Probation? No data recorded Contacted To Inform of Risk of Harm To Self or Others: Other: Comment (NA)    Does Patient Present under Involuntary Commitment? No  IVC Papers Initial File Date: No data recorded  [place] of Residence: [place]   Patient Currently Receiving the Following Services: Not Receiving Services   Determination of Need: Routine (7 days)   Options For Referral: Other: Comment (To be determined)     CCA Biopsychosocial Patient Reported Schizophrenia/Schizoaffective Diagnosis in Past: Yes   Strengths: Pt is IDD but has insight in reference to her current housing needs   Mental Health Symptoms Depression:   None   Duration of Depressive symptoms:    Mania:   None   Anxiety:    None   Psychosis:   None   Duration of Psychotic symptoms:    Trauma:   N/A   Obsessions:   N/A   Compulsions:   None   Inattention:   Disorganized   Hyperactivity/Impulsivity:   N/A   Oppositional/Defiant Behaviors:   None   Emotional Irregularity:    None   Other Mood/Personality Symptoms:   None noted    Mental Status Exam Appearance and self-care  Stature:   Average   Weight:   Average weight   Clothing:   Age-appropriate   Grooming:   Normal   Cosmetic use:   None   Posture/gait:   Normal   Motor activity:   Not Remarkable   Sensorium  Attention:   Normal   Concentration:   Focuses on irrelevancies   Orientation:   X5   Recall/memory:   Normal   Affect and Mood  Affect:   Appropriate; Flat   Mood:   Euthymic   Relating  Eye contact:   Normal   Facial expression:   Responsive   Attitude toward examiner:   Cooperative   Thought and Language  Speech flow:  Clear and Coherent   Thought content:   Appropriate to Mood and Circumstances   Preoccupation:   None   Hallucinations:   None   Organization:  No data recorded  [employer] of Knowledge:  Poor   Intelligence:   Average   Abstraction:   Functional   Judgement:   Fair   Reality Testing:   Adequate   Insight:   Fair   Decision Making:   Impulsive   Social Functioning  Social Maturity:   Impulsive   Social Judgement:   Impropriety   Stress  Stressors:   Housing   Coping Ability:   Human resources officer Deficits:   Scientist, physiological; Self-control   Supports:   Support needed     Religion: Religion/Spirituality Are You A Religious Person?: No How Might This Affect Treatment?: Not assessed  Leisure/Recreation: Leisure / Recreation Do You Have Hobbies?: No  Exercise/Diet: Exercise/Diet Do You Exercise?: No Have You Gained or Lost A Significant Amount of Weight in the Past Six Months?: No Do You Follow a Special Diet?: No Do You Have Any Trouble Sleeping?: No   CCA Employment/Education Employment/Work Situation: Employment / Work Situation Employment Situation: Unemployed Patient's Job has Been Impacted by Current Illness: No Has Patient ever Been in Equities trader?:  No  Education: Education Last Grade Completed:  (Not assessed) Did Theme park manager?: No Did You Have An Individualized Education Program (IIEP): No Did You Have Any Difficulty At School?: No   CCA Family/Childhood History Family and Relationship History: Family history Marital status: Single Does patient have children?: No  Childhood History:  Childhood History By whom was/is the patient raised?: Mother (Not assessed) Did patient suffer any verbal/emotional/physical/sexual abuse as a child?: No Did patient suffer from severe childhood neglect?: No Has patient ever been sexually abused/assaulted/raped as an adolescent or adult?: No Was the patient ever a victim of a crime or a disaster?: No Witnessed domestic violence?: No Has patient been affected by domestic violence as an adult?: No  Child/Adolescent Assessment:     CCA Substance Use Alcohol/Drug Use: Alcohol / Drug Use Pain Medications: See MAR Prescriptions: See MAR Over the Counter: See MAR History of alcohol / drug use?: No history of alcohol / drug abuse Longest period of sobriety (when/how long): N/A Negative Consequences of Use:  (N/A) Withdrawal Symptoms:  (N/A)                         ASAM's:  Six Dimensions of Multidimensional Assessment  Dimension 1:  Acute Intoxication and/or Withdrawal Potential:      Dimension 2:  Biomedical Conditions and Complications:      Dimension 3:  Emotional, Behavioral, or Cognitive Conditions and Complications:     Dimension 4:  Readiness to Change:     Dimension 5:  Relapse, Continued use, or Continued Problem Potential:     Dimension 6:  Recovery/Living Environment:     ASAM Severity Score:    ASAM Recommended Level of Treatment: ASAM Recommended Level of Treatment:  (N/A)   Substance use Disorder (SUD) Substance Use Disorder (SUD)  Checklist Symptoms of Substance Use:  (N/A)  Recommendations for Services/Supports/Treatments: Recommendations for  Services/Supports/Treatments Recommendations For Services/Supports/Treatments: Medication Management, Individual Therapy, Other (Comment)  Discharge Disposition:    DSM5 Diagnoses: Patient Active Problem List   Diagnosis Date Noted   Aggressive behavior 10/21/2020   Schizophrenia (HCC) 06/02/2020   COPD (chronic obstructive pulmonary disease) (HCC) 06/02/2020   Broken ankle 06/02/2020     Referrals to Alternative Service(s): Referred to Alternative Service(s):   Place:   Date:   Time:    Referred to Alternative Service(s):   Place:   Date:   Time:  Referred to Alternative Service(s):   Place:   Date:   Time:    Referred to Alternative Service(s):   Place:   Date:   Time:     Alfredia Ferguson, LCAS

## 2021-10-04 NOTE — ED Notes (Signed)
Patient A&Ox4. Patient denies SI/Hi and AVH.  Patient denies any physical complaints when asked. No acute distress noted. Support and encouragement provided. Routine safety checks conducted according to facility protocol. Encouraged patient to notify staff if thoughts of harm toward self or others arise. Patient verbalize understanding and agreement. Will continue to monitor for safety.    

## 2021-10-04 NOTE — BH Assessment (Signed)
Teresa Murillo, Urgent; 61 year old presents to [facility] voluntarily and unaccompanied at this time with complaints of  staff at [facility].  Pt admits to being aggressive and angry at staff.  Pt denies SI, HI or AVH/drug and alcohol use.  Pt admits to prior MH diagnosis or prescribed medication for symptom management.  MSE signed by patient.

## 2021-10-04 NOTE — ED Notes (Signed)
Patient oriented to unit and given a salad.

## 2021-10-04 NOTE — ED Provider Notes (Signed)
[facility] Continuous Assessment Admission H&P  Date: 10/04/21 Patient Name: Teresa Murillo MRN: [id number] Chief Complaint: Murillo chief complaint on file.    Diagnoses:  Final diagnoses:  Behavior concern in adult   HPI: Pt presents voluntarily to [facility] behavioral health for walk-in assessment escorted by police. Pt is assessed face-to-face by nurse practitioner.   Teresa Murillo, 61 y.o., female patient seen face to face by this provider, consulted with Dr. Lucianne Muss; and chart reviewed on 10/04/21.  Per chart review, pt w/ hx of IDD and schizophrenia. On evaluation Teresa Murillo reports she is presenting today because "the owner of the place (group home) doesn't want me there". She reports she argued w/ Teresa Murillo, a group home staff member prior to arrival at this facility, states argument was regarding food. She denies SI/VI/HI, NSSI ("scares me at the thought of it"), AVH, paranoia. Teresa Murillo gave verbal consent to reach out to group home staff, including Teresa Murillo and Teresa Murillo.   Collateral w/ Teresa Murillo, [phone]. Teresa Murillo states pt has been given 60 day eviction notice on multiple occassions. She states today pt signed a voluntary immediate discharge. Pt will Murillo longer be accepted back at their facility. She states pt is her own legal guardian. She states pt's psychiatric medications are managed by [facility]. Pt does not have an ACT team.   Collateral w/ Teresa Murillo, [phone], who came in person to the facility. She states earlier today she and pt had an altercation regarding pt demanding something to eat. She states pt has hx of calling the police or asking to go to the hospital when she does not get her way. When she does not get her way, pt will also threaten to kill herself or ask someone else to kill her. She states today pt signed voluntary immediate discharge which was witnessed by police and pt understands she cannot return.   During evaluation Teresa Murillo  is sitting  in Murillo acute distress. She is alert, oriented x 3, calm, cooperative and attentive.  Her mood is euthymic with blunt affect. TP is coherent. Description of associations is intact. TC is logical. There is Murillo evidence of internal preoccupation, agitation, aggression. Murillo delusions or paranoia elicited. Pt is calm, cooperative. She answers questions appropriately.  Pt to be admitted to continuous assessment. Treatment team will consult with SW to determine discharge planning.   PHQ 2-9:   Flowsheet Row ED from 10/04/2021 in [facility] ED from 09/09/2021 in [facility] EMERGENCY DEPART[facility] ED from 08/22/2021 in [facility] Dept  C-SSRS RISK CATEGORY Murillo Risk Murillo Risk Murillo Risk        Total Time spent with patient: 20 minutes  Musculoskeletal  Strength & Muscle Tone: within normal limits Gait & Station: normal Patient leans: N/A  Psychiatric Specialty Exam  Presentation General Appearance: Casual; Appropriate for Environment  Eye Contact:Good  Speech:Clear and Coherent; Normal Rate  Speech Volume:Normal  Handedness:Right   Mood and Affect  Mood:Euthymic  Affect:Blunt   Thought Process  Thought Processes:Coherent  Descriptions of Associations:Intact  Orientation:Full (Time, Place and Person)  Thought Content:Logical  Diagnosis of Schizophrenia or Schizoaffective disorder in past: Yes  Duration of Psychotic Symptoms: Greater than six months  Hallucinations:Hallucinations: None  Ideas of Reference:None  Suicidal Thoughts:Suicidal Thoughts: Murillo  Homicidal Thoughts:Homicidal Thoughts: Murillo   Sensorium  Memory:Immediate Fair  Judgment:Intact  Insight:Present   Executive Functions  Concentration:Fair  Attention Span:Fair  Recall:Fair  Fund of Knowledge:Fair  Language:Fair  Psychomotor Activity  Psychomotor Activity:Psychomotor Activity: Normal   Assets  Assets:Communication  Skills; Desire for Improvement   Sleep  Sleep:Sleep: Good   Murillo data recorded  Physical Exam Cardiovascular:     Rate and Rhythm: Normal rate.  Pulmonary:     Effort: Pulmonary effort is normal.  Neurological:     Mental Status: She is alert and oriented to person, place, and time.  Psychiatric:        Attention and Perception: Attention and perception normal.        Mood and Affect: Mood normal. Affect is blunt.        Speech: Speech normal.        Behavior: Behavior normal. Behavior is cooperative.        Thought Content: Thought content normal.    Review of Systems  Constitutional:  Negative for chills and fever.  Respiratory:  Negative for shortness of breath.   Cardiovascular:  Negative for chest pain and palpitations.  Gastrointestinal:  Negative for abdominal pain.  Neurological:  Negative for dizziness and headaches.  Psychiatric/Behavioral: Negative.      Blood pressure 133/88, pulse 100, temperature 97.6 F (36.4 C), temperature source Oral, resp. rate 18, SpO2 97 %. There is Murillo height or weight on file to calculate BMI.  Past Psychiatric History: Hx of IDD and schizophrenia   Is the patient at risk to self? Murillo  Has the patient been a risk to self in the past 6 months? Murillo .    Has the patient been a risk to self within the distant past? Murillo   Is the patient a risk to others? Murillo   Has the patient been a risk to others in the past 6 months? Murillo   Has the patient been a risk to others within the distant past? Murillo   Past Medical History:  Past Medical History:  Diagnosis Date   Chronic obstructive pulmonary disease (COPD) (HCC)    Glaucoma    Hyperlipidemia    Hypertension    Iron deficiency    Schizoaffective disorder (HCC)    Type 2 diabetes mellitus (HCC)     Past Surgical History:  Procedure Laterality Date   TUBAL LIGATION      Family History:  Family History  Problem Relation Age of Onset   Breast cancer Maternal Grandmother     Social  History:  Social History   Socioeconomic History   Marital status: Divorced    Spouse name: Not on file   Number of children: Not on file   Years of education: Not on file   Highest education level: Not on file  Occupational History   Not on file  Tobacco Use   Smoking status: Every Day    Packs/day: 1.00    Types: Cigars, Cigarettes   Smokeless tobacco: Current  Vaping Use   Vaping Use: Never used  Substance and Sexual Activity   Alcohol use: Yes   Drug use: Not Currently   Sexual activity: Not Currently    Birth control/protection: Surgical  Other Topics Concern   Not on file  Social History Narrative   Not on file   Social Determinants of Health   Financial Resource Strain: Not on file  Food Insecurity: Not on file  Transportation Needs: Not on file  Physical Activity: Not on file  Stress: Not on file  Social Connections: Not on file  Intimate Partner Violence: Not on file    SDOH:  SDOH Screenings   Alcohol  Screen: Not on file  Depression (PHQ2-9): Not on file  Financial Resource Strain: Not on file  Food Insecurity: Not on file  Housing: Not on file  Physical Activity: Not on file  Social Connections: Not on file  Stress: Not on file  Tobacco Use: High Risk (09/09/2021)   Patient History    Smoking Tobacco Use: Every Day    Smokeless Tobacco Use: Current    Passive Exposure: Not on file  Transportation Needs: Not on file    Last Labs:  Admission on 10/04/2021  Component Date Value Ref Range Status   SARS Coronavirus 2 by RT PCR 10/04/2021 NEGATIVE  NEGATIVE Final   Comment: (NOTE) SARS-CoV-2 target nucleic acids are NOT DETECTED.  The SARS-CoV-2 RNA is generally detectable in upper respiratory specimens during the acute phase of infection. The lowest concentration of SARS-CoV-2 viral copies this assay can detect is 138 copies/mL. A negative result does not preclude SARS-Cov-2 infection and should not be used as the sole basis for treatment  or other patient management decisions. A negative result may occur with  improper specimen collection/handling, submission of specimen other than nasopharyngeal swab, presence of viral mutation(s) within the areas targeted by this assay, and inadequate number of viral copies(<138 copies/mL). A negative result must be combined with clinical observations, patient history, and epidemiological information. The expected result is Negative.  Fact Sheet for Patients:  BloggerCourse.com  Fact Sheet for Healthcare Providers:  SeriousBroker.it  This test is Murillo                          t yet approved or cleared by the [place] FDA and  has been authorized for detection and/or diagnosis of SARS-CoV-2 by FDA under an Emergency Use Authorization (EUA). This EUA will remain  in effect (meaning this test can be used) for the duration of the COVID-19 declaration under Section 564(b)(1) of the Act, 21 U.S.C.section 360bbb-3(b)(1), unless the authorization is terminated  or revoked sooner.       Influenza A by PCR 10/04/2021 NEGATIVE  NEGATIVE Final   Influenza B by PCR 10/04/2021 NEGATIVE  NEGATIVE Final   Comment: (NOTE) The Xpert Xpress SARS-CoV-2/FLU/RSV plus assay is intended as an aid in the diagnosis of influenza from Nasopharyngeal swab specimens and should not be used as a sole basis for treatment. Nasal washings and aspirates are unacceptable for Xpert Xpress SARS-CoV-2/FLU/RSV testing.  Fact Sheet for Patients: BloggerCourse.com  Fact Sheet for Healthcare Providers: SeriousBroker.it  This test is not yet approved or cleared by the [place] FDA and has been authorized for detection and/or diagnosis of SARS-CoV-2 by FDA under an Emergency Use Authorization (EUA). This EUA will remain in effect (meaning this test can be used) for the duration of the COVID-19 declaration under  Section 564(b)(1) of the Act, 21 U.S.C. section 360bbb-3(b)(1), unless the authorization is terminated or revoked.  Performed at [facility], [address] [place], [place] [address]    WBC 10/04/2021 8.4  4.0 - 10.5 K/uL Final   RBC 10/04/2021 4.42  3.87 - 5.11 MIL/uL Final   Hemoglobin 10/04/2021 11.6 (L)  12.0 - 15.0 g/dL Final   HCT 60/45/4098 35.7 (L)  36.0 - 46.0 % Final   MCV 10/04/2021 80.8  80.0 - 100.0 fL Final   MCH 10/04/2021 26.2  26.0 - 34.0 pg Final   MCHC 10/04/2021 32.5  30.0 - 36.0 g/dL Final   RDW 11/91/4782 16.0 (H)  11.5 - 15.5 % Final   Platelets 10/04/2021 372  150 - 400 K/uL Final   nRBC 10/04/2021 0.0  0.0 - 0.2 % Final   Neutrophils Relative % 10/04/2021 68  % Final   Neutro Abs 10/04/2021 5.7  1.7 - 7.7 K/uL Final   Lymphocytes Relative 10/04/2021 22  % Final   Lymphs Abs 10/04/2021 1.8  0.7 - 4.0 K/uL Final   Monocytes Relative 10/04/2021 8  % Final   Monocytes Absolute 10/04/2021 0.7  0.1 - 1.0 K/uL Final   Eosinophils Relative 10/04/2021 1  % Final   Eosinophils Absolute 10/04/2021 0.1  0.0 - 0.5 K/uL Final   Basophils Relative 10/04/2021 1  % Final   Basophils Absolute 10/04/2021 0.1  0.0 - 0.1 K/uL Final   Immature Granulocytes 10/04/2021 0  % Final   Abs Immature Granulocytes 10/04/2021 0.03  0.00 - 0.07 K/uL Final   Performed at [facility], [address] [place], [place] [address]   Sodium 10/04/2021 136  135 - 145 mmol/L Final   Potassium 10/04/2021 4.2  3.5 - 5.1 mmol/L Final   Chloride 10/04/2021 104  98 - 111 mmol/L Final   CO2 10/04/2021 25  22 - 32 mmol/L Final   Glucose, Bld 10/04/2021 100 (H)  70 - 99 mg/dL Final   Glucose reference range applies only to samples taken after fasting for at least 8 hours.   BUN 10/04/2021 9  6 - 20 mg/dL Final   Creatinine, Ser 10/04/2021 0.52  0.44 - 1.00 mg/dL Final   Calcium 60/45/4098 9.0  8.9 - 10.3 mg/dL Final   Total Protein 11/91/4782 7.0  6.5 - 8.1 g/dL Final   Albumin  95/62/1308 3.2 (L)  3.5 - 5.0 g/dL Final   AST 65/78/4696 13 (L)  15 - 41 U/L Final   ALT 10/04/2021 10  0 - 44 U/L Final   Alkaline Phosphatase 10/04/2021 61  38 - 126 U/L Final   Total Bilirubin 10/04/2021 0.3  0.3 - 1.2 mg/dL Final   GFR, Estimated 10/04/2021 >60  >60 mL/min Final   Comment: (NOTE) Calculated using the CKD-EPI Creatinine Equation (2021)    Anion gap 10/04/2021 7  5 - 15 Final   Performed at [facility], [address] [place], [place] [address]   Hgb A1c MFr Bld 10/04/2021 7.0 (H)  4.8 - 5.6 % Final   Comment: (NOTE) Pre diabetes:          5.7%-6.4%  Diabetes:              >6.4%  Glycemic control for   <7.0% adults with diabetes    Mean Plasma Glucose 10/04/2021 154.2  mg/dL Final   Performed at [facility], [address], [place], [place] [address]   Cholesterol 10/04/2021 178  0 - 200 mg/dL Final   Triglycerides 40/11/2723 155 (H)  <150 mg/dL Final   HDL 36/64/4034 52  >40 mg/dL Final   Total CHOL/HDL Ratio 10/04/2021 3.4  RATIO Final   VLDL 10/04/2021 31  0 - 40 mg/dL Final   LDL Cholesterol 10/04/2021 95  0 - 99 mg/dL Final   Comment:        Total Cholesterol/HDL:CHD Risk Coronary Heart Disease Risk Table                     Men   Women  1/2 Average Risk   3.4   3.3  Average Risk  5.0   4.4  2 X Average Risk   9.6   7.1  3 X Average Risk  23.4   11.0        Use the calculated Patient Ratio above and the CHD Risk Table to determine the patient's CHD Risk.        ATP III CLASSIFICATION (LDL):  <100     mg/dL   Optimal  144-818  mg/dL   Near or Above                    Optimal  130-159  mg/dL   Borderline  563-149  mg/dL   High  >702     mg/dL   Very High Performed at [facility], [address] [place], [place] [address]    POC Amphetamine UR 10/04/2021 None Detected  NONE DETECTED (Cut Off Level 1000 ng/mL) Final   POC Secobarbital (BAR) 10/04/2021 None Detected  NONE DETECTED (Cut Off Level 300 ng/mL) Final   POC  Buprenorphine (BUP) 10/04/2021 None Detected  NONE DETECTED (Cut Off Level 10 ng/mL) Final   POC Oxazepam (BZO) 10/04/2021 None Detected  NONE DETECTED (Cut Off Level 300 ng/mL) Final   POC Cocaine UR 10/04/2021 None Detected  NONE DETECTED (Cut Off Level 300 ng/mL) Final   POC Methamphetamine UR 10/04/2021 None Detected  NONE DETECTED (Cut Off Level 1000 ng/mL) Final   POC Morphine 10/04/2021 None Detected  NONE DETECTED (Cut Off Level 300 ng/mL) Final   POC Methadone UR 10/04/2021 None Detected  NONE DETECTED (Cut Off Level 300 ng/mL) Final   POC Oxycodone UR 10/04/2021 Positive (A)  NONE DETECTED (Cut Off Level 100 ng/mL) Final   POC Marijuana UR 10/04/2021 None Detected  NONE DETECTED (Cut Off Level 50 ng/mL) Final   SARSCOV2ONAVIRUS 2 AG 10/04/2021 NEGATIVE  NEGATIVE Final   Comment: (NOTE) SARS-CoV-2 antigen NOT DETECTED.   Negative results are presumptive.  Negative results do not preclude SARS-CoV-2 infection and should not be used as the sole basis for treatment or other patient management decisions, including infection  control decisions, particularly in the presence of clinical signs and  symptoms consistent with COVID-19, or in those who have been in contact with the virus.  Negative results must be combined with clinical observations, patient history, and epidemiological information. The expected result is Negative.  Fact Sheet for Patients: https://www.jennings-kim.com/  Fact Sheet for Healthcare Providers: https://alexander-rogers.biz/  This test is not yet approved or cleared by the [place] FDA and  has been authorized for detection and/or diagnosis of SARS-CoV-2 by FDA under an Emergency Use Authorization (EUA).  This EUA will remain in effect (meaning this test can be used) for the duration of  the COV                          ID-19 declaration under Section 564(b)(1) of the Act, 21 U.S.C. section 360bbb-3(b)(1), unless the authorization  is terminated or revoked sooner.     Valproic Acid Lvl 10/04/2021 51  50.0 - 100.0 ug/mL Final   Performed at [facility], [address] [place], [place] [address]  Admission on 09/09/2021, Discharged on 09/10/2021  Component Date Value Ref Range Status   Sodium 09/09/2021 134 (L)  135 - 145 mmol/L Final   Potassium 09/09/2021 4.3  3.5 - 5.1 mmol/L Final   Chloride 09/09/2021 99  98 - 111 mmol/L Final   CO2 09/09/2021 25  22 - 32 mmol/L Final   Glucose, Bld 09/09/2021 107 (H)  70 - 99 mg/dL Final   Glucose reference range applies only to samples taken after fasting for at least 8 hours.   BUN 09/09/2021 12  6 - 20 mg/dL Final   Creatinine, Ser 09/09/2021 0.74  0.44 - 1.00 mg/dL Final   Calcium 16/10/960407/01/2022 9.0  8.9 - 10.3 mg/dL Final   Total Protein 54/09/811907/01/2022 7.4  6.5 - 8.1 g/dL Final   Albumin 14/78/295607/01/2022 3.1 (L)  3.5 - 5.0 g/dL Final   AST 21/30/865707/01/2022 13 (L)  15 - 41 U/L Final   ALT 09/09/2021 13  0 - 44 U/L Final   Alkaline Phosphatase 09/09/2021 66  38 - 126 U/L Final   Total Bilirubin 09/09/2021 0.2 (L)  0.3 - 1.2 mg/dL Final   GFR, Estimated 09/09/2021 >60  >60 mL/min Final   Comment: (NOTE) Calculated using the CKD-EPI Creatinine Equation (2021)    Anion gap 09/09/2021 10  5 - 15 Final   Performed at [facility], [address] [place], [place] [address]   WBC 09/09/2021 8.5  4.0 - 10.5 K/uL Final   RBC 09/09/2021 4.53  3.87 - 5.11 MIL/uL Final   Hemoglobin 09/09/2021 11.8 (L)  12.0 - 15.0 g/dL Final   HCT 29/52/841307/01/2022 38.0  36.0 - 46.0 % Final   MCV 09/09/2021 83.9  80.0 - 100.0 fL Final   MCH 09/09/2021 26.0  26.0 - 34.0 pg Final   MCHC 09/09/2021 31.1  30.0 - 36.0 g/dL Final   RDW 24/40/102707/01/2022 17.8 (H)  11.5 - 15.5 % Final   Platelets 09/09/2021 442 (H)  150 - 400 K/uL Final   nRBC 09/09/2021 0.0  0.0 - 0.2 % Final   Neutrophils Relative % 09/09/2021 70  % Final   Neutro Abs 09/09/2021 5.9  1.7 - 7.7 K/uL Final   Lymphocytes Relative 09/09/2021 20  % Final    Lymphs Abs 09/09/2021 1.7  0.7 - 4.0 K/uL Final   Monocytes Relative 09/09/2021 8  % Final   Monocytes Absolute 09/09/2021 0.7  0.1 - 1.0 K/uL Final   Eosinophils Relative 09/09/2021 1  % Final   Eosinophils Absolute 09/09/2021 0.1  0.0 - 0.5 K/uL Final   Basophils Relative 09/09/2021 1  % Final   Basophils Absolute 09/09/2021 0.1  0.0 - 0.1 K/uL Final   Immature Granulocytes 09/09/2021 0  % Final   Abs Immature Granulocytes 09/09/2021 0.03  0.00 - 0.07 K/uL Final   Performed at [facility], [address] [place], [place] [address]   Ammonia 09/09/2021 37 (H)  9 - 35 umol/L Final   Comment: HEMOLYSIS AT THIS LEVEL MAY AFFECT RESULT Performed at [facility], [address] [place], [place] [address]    Opiates 09/09/2021 NONE DETECTED  NONE DETECTED Final   Cocaine 09/09/2021 NONE DETECTED  NONE DETECTED Final   Benzodiazepines 09/09/2021 NONE DETECTED  NONE DETECTED Final   Amphetamines 09/09/2021 NONE DETECTED  NONE DETECTED Final   Tetrahydrocannabinol 09/09/2021 NONE DETECTED  NONE DETECTED Final   Barbiturates 09/09/2021 NONE DETECTED  NONE DETECTED Final   Comment: (NOTE) DRUG SCREEN FOR MEDICAL PURPOSES ONLY.  IF CONFIRMATION IS NEEDED FOR ANY PURPOSE, NOTIFY LAB WITHIN 5 DAYS.  LOWEST DETECTABLE LIMITS FOR URINE DRUG SCREEN Drug Class                     Cutoff (ng/mL) Amphetamine and metabolites  1000 Barbiturate and metabolites    200 Benzodiazepine                 200 Tricyclics and metabolites     300 Opiates and metabolites        300 Cocaine and metabolites        300 THC                            50 Performed at [facility], [address], [place], [place] [address]    Alcohol, Ethyl (B) 09/09/2021 <10  <10 mg/dL Final   Comment: (NOTE) Lowest detectable limit for serum alcohol is 10 mg/dL.  For medical purposes only. Performed at [facility], [address] [place] , [place] [address]    Lipase 09/09/2021 33  11 - 51  U/L Final   Performed at [facility], [address], [place], [place] [address]   Color, Urine 09/09/2021 COLORLESS (A)  YELLOW Final   APPearance 09/09/2021 CLEAR  CLEAR Final   Specific Gravity, Urine 09/09/2021 1.002 (L)  1.005 - 1.030 Final   pH 09/09/2021 6.0  5.0 - 8.0 Final   Glucose, UA 09/09/2021 NEGATIVE  NEGATIVE mg/dL Final   Hgb urine dipstick 09/09/2021 NEGATIVE  NEGATIVE Final   Bilirubin Urine 09/09/2021 NEGATIVE  NEGATIVE Final   Ketones, ur 09/09/2021 NEGATIVE  NEGATIVE mg/dL Final   Protein, ur 54/27/0623 NEGATIVE  NEGATIVE mg/dL Final   Nitrite 76/28/3151 NEGATIVE  NEGATIVE Final   Leukocytes,Ua 09/09/2021 NEGATIVE  NEGATIVE Final   Performed at [facility], [address] [place], [place] [address]   Specimen Description 09/09/2021 URINE, CLEAN CATCH   Final   Special Requests 09/09/2021 NONE   Final   Culture 09/09/2021  (A)   Final                   Value:<10,000 COLONIES/mL INSIGNIFICANT GROWTH Performed at [facility], [address] [place], [place] [address]    Report Status 09/09/2021 09/10/2021 FINAL   Final   D-Dimer, Quant 09/09/2021 0.92 (H)  0.00 - 0.50 ug/mL-FEU Final   Comment: (NOTE) At the manufacturer cut-off value of 0.5 g/mL FEU, this assay has a negative predictive value of 95-100%.This assay is intended for use in conjunction with a clinical pretest probability (PTP) assessment model to exclude pulmonary embolism (PE) and deep venous thrombosis (DVT) in outpatients suspected of PE or DVT. Results should be correlated with clinical presentation. Performed at [facility], [address], [place], [place] [address]    Troponin I (High Sensitivity) 09/09/2021 5  <18 ng/L Final   Comment: (NOTE) Elevated high sensitivity troponin I (hsTnI) values and significant  changes across serial measurements may suggest ACS but many other  chronic and acute conditions are known to elevate hsTnI results.  Refer to the  "Links" section for chest pain algorithms and additional  guidance. Performed at [facility], [address], [place], [place] [address]    BP 09/10/2021 135/83  mmHg Final   S' Lateral 09/10/2021 2.30  cm Final   Area-P 1/2 09/10/2021 5.58  cm2 Final    Allergies: Penicillins  PTA Medications: (Not in a hospital admission)   Medical Decision Making  Admitted to continuous assessment for discharge planning. Pt to be admitted to continuous assessment. Treatment team will consult with SW to determine discharge planning.  Recommendations  Based on my evaluation the patient does not appear to have an emergency medical condition.  Lauree Chandler, NP 10/04/21  8:21 PM

## 2021-10-04 NOTE — ED Notes (Signed)
Patient resting quietly in bed with eyes closed. Respirations equal and unlabored, skin warm and dry, NAD. No change in assessment or acuity. Routine safety checks conducted according to facility protocol. Will continue to monitor for safety.   

## 2021-10-05 ENCOUNTER — Encounter: Payer: Medicaid Other | Admitting: Obstetrics and Gynecology

## 2021-10-05 MED ORDER — OLANZAPINE 10 MG PO TABS
20.0000 mg | ORAL_TABLET | Freq: Every day | ORAL | Status: DC
Start: 2021-10-05 — End: 2021-10-22
  Administered 2021-10-05 – 2021-10-21 (×17): 20 mg via ORAL
  Filled 2021-10-05 (×17): qty 2

## 2021-10-05 NOTE — ED Notes (Signed)
Salad, cheese stick and cheez it given

## 2021-10-05 NOTE — ED Notes (Signed)
Pt resting quietly. Breathing even and unlabored.  

## 2021-10-05 NOTE — ED Notes (Signed)
Mrs Rubena is currently sleeping no signs of distress or disturbed sleep patterns noted. Respirations are easy skin color is appropriate for her ethnicity.

## 2021-10-05 NOTE — ED Notes (Signed)
Pt resting quietly. Continue to wait on Dispo.

## 2021-10-05 NOTE — ED Provider Notes (Signed)
Behavioral Health Progress Note  Date and Time: 10/05/2021 11:14 AM Name: Teresa Murillo MRN:  [id number]  Subjective:   Shenell Leith is a 61 year old female with a past psychiatric history of schizophrenia, aggressive behavior, and possible intellectual disability.  She presented to the [facility] behavioral health urgent care after an argument with staff members at her group home that resulted in her signing discharge paperwork and leaving.  Per previous documentation, the group home staff members have reported that the patient has a history of intellectual disability.  Cannot find this documented elsewhere in the chart.  It is certainly possible, as the patient is unable to state any of her home medications and is unable to perform simple math problems.  The patient reports that she receives a disability check each month.  It is documented that the patient is connected with [facility] but does not receive ACT services.  On assessment this morning, the patient is calm and cooperative.  Per nursing documentation the patient has also been calm and cooperative.  Presently she does not appear to be psychotic and she does not admit to any auditory or visual hallucinations.  She denies suicidal or homicidal thoughts.  She reports appropriate mood, sleep, and appetite.  She reports that her plan for the future is to "go shopping and get a place to live".  Diagnosis:  Final diagnoses:  Behavior concern in adult    Total Time spent with patient: 15 minutes  Past Psychiatric History: as above Past Medical History:  Past Medical History:  Diagnosis Date   Chronic obstructive pulmonary disease (COPD) ([place])    Glaucoma    Hyperlipidemia    Hypertension    Iron deficiency    Schizoaffective disorder ([place])    Type 2 diabetes mellitus ([place])     Past Surgical History:  Procedure Laterality Date   TUBAL LIGATION     Family History:  Family History  Problem Relation Age of Onset    Breast cancer Maternal Grandmother    Family Psychiatric  History: per H and P Social History:  Social History   Substance and Sexual Activity  Alcohol Use Yes     Social History   Substance and Sexual Activity  Drug Use Not Currently    Social History   Socioeconomic History   Marital status: Divorced    Spouse name: Not on file   Number of children: Not on file   Years of education: Not on file   Highest education level: Not on file  Occupational History   Not on file  Tobacco Use   Smoking status: Every Day    Packs/day: 1.00    Types: Cigars, Cigarettes   Smokeless tobacco: Current  Vaping Use   Vaping Use: Never used  Substance and Sexual Activity   Alcohol use: Yes   Drug use: Not Currently   Sexual activity: Not Currently    Birth control/protection: Surgical  Other Topics Concern   Not on file  Social History Narrative   Not on file   Social Determinants of Health   Financial Resource Strain: Not on file  Food Insecurity: Not on file  Transportation Needs: Not on file  Physical Activity: Not on file  Stress: Not on file  Social Connections: Not on file   SDOH:  SDOH Screenings   Alcohol Screen: Not on file  Depression [id number]): Not on file  Financial Resource Strain: Not on file  Food Insecurity: Not on file  Housing: Not on  file  Physical Activity: Not on file  Social Connections: Not on file  Stress: Not on file  Tobacco Use: High Risk (09/09/2021)   Patient History    Smoking Tobacco Use: Every Day    Smokeless Tobacco Use: Current    Passive Exposure: Not on file  Transportation Needs: Not on file   Additional Social History:    Pain Medications: See MAR Prescriptions: See MAR Over the Counter: See MAR History of alcohol / drug use?: No history of alcohol / drug abuse Longest period of sobriety (when/how long): N/A Negative Consequences of Use:  (N/A) Withdrawal Symptoms:  (N/A)                    Sleep:  Fair  Appetite:  Fair  Current Medications:  Current Facility-Administered Medications  Medication Dose Route Frequency Provider Last Rate Last Admin   acetaminophen (TYLENOL) tablet 650 mg  650 mg Oral Q6H PRN Tharon Aquas, NP       alum & mag hydroxide-simeth (MAALOX/MYLANTA) 200-200-20 MG/5ML suspension 30 mL  30 mL Oral Q4H PRN Tharon Aquas, NP       benztropine (COGENTIN) tablet 1 mg  1 mg Oral BID Tharon Aquas, NP   1 mg at 10/05/21 0925   cloZAPine (CLOZARIL) tablet 50 mg  50 mg Oral BID Tharon Aquas, NP   50 mg at 10/05/21 0924   divalproex (DEPAKOTE ER) 24 hr tablet 500 mg  500 mg Oral BID Tharon Aquas, NP   500 mg at 10/05/21 [id number]   haloperidol (HALDOL) tablet 10 mg  10 mg Oral TID Tharon Aquas, NP   10 mg at 10/05/21 0925   latanoprost (XALATAN) 0.005 % ophthalmic solution 1 drop  1 drop Both Eyes QHS Tharon Aquas, NP       lisinopril (ZESTRIL) tablet 10 mg  10 mg Oral Daily Tharon Aquas, NP   10 mg at 10/05/21 [id number]   magnesium hydroxide (MILK OF MAGNESIA) suspension 30 mL  30 mL Oral Daily PRN Tharon Aquas, NP       metFORMIN (GLUCOPHAGE) tablet 500 mg  500 mg Oral BID WC Tharon Aquas, NP   500 mg at 10/05/21 0900   mometasone-formoterol (DULERA) 200-5 MCG/ACT inhaler 2 puff  2 puff Inhalation BID Tharon Aquas, NP   2 puff at 10/05/21 0900   nicotine (NICODERM CQ - dosed in mg/24 hr) patch 7 mg  7 mg Transdermal Daily Tharon Aquas, NP   7 mg at 10/05/21 [id number]   valbenazine (INGREZZA) capsule 40 mg  40 mg Oral q AM Tharon Aquas, NP   40 mg at 10/05/21 [id number]   Current Outpatient Medications  Medication Sig Dispense Refill   benztropine (COGENTIN) 1 MG tablet Take 1 mg by mouth in the morning and at bedtime.     budesonide-formoterol (SYMBICORT) 160-4.5 MCG/ACT inhaler Inhale 2 puffs into the lungs in the morning and at bedtime.     Cholecalciferol (VITAMIN D3) 1.25 MG (50000 UT) CAPS Take  50,000 Units by mouth every Thursday.     clozapine (CLOZARIL) 50 MG tablet Take 50 mg by mouth 2 (two) times daily.     divalproex (DEPAKOTE ER) 500 MG 24 hr tablet Take 500 mg by mouth in the morning and at bedtime.     haloperidol (HALDOL) 10 MG tablet Take 10 mg by mouth 3 (three) times daily.     INGREZZA 40 MG  capsule Take 40 mg by mouth in the morning.     latanoprost (XALATAN) 0.005 % ophthalmic solution Place 1 drop into both eyes at bedtime.     lisinopril (ZESTRIL) 10 MG tablet Take 10 mg by mouth daily.     metFORMIN (GLUCOPHAGE) 500 MG tablet Take 500 mg by mouth 2 (two) times daily with a meal.     nicotine (NICODERM CQ - DOSED IN MG/24 HR) 7 mg/24hr patch Place 7 mg onto the skin daily.     OLANZapine-Samidorphan (LYBALVI) 20-10 MG TABS Take 1 tablet by mouth at bedtime.     PROAIR HFA 108 (90 Base) MCG/ACT inhaler Inhale 2 puffs into the lungs every 6 (six) hours as needed for wheezing or shortness of breath.     zolpidem (AMBIEN) 10 MG tablet Take 10 mg by mouth at bedtime.      Labs  Lab Results:  Admission on 10/04/2021  Component Date Value Ref Range Status   SARS Coronavirus 2 by RT PCR 10/04/2021 NEGATIVE  NEGATIVE Final   Comment: (NOTE) SARS-CoV-2 target nucleic acids are NOT DETECTED.  The SARS-CoV-2 RNA is generally detectable in upper respiratory specimens during the acute phase of infection. The lowest concentration of SARS-CoV-2 viral copies this assay can detect is 138 copies/mL. A negative result does not preclude SARS-Cov-2 infection and should not be used as the sole basis for treatment or other patient management decisions. A negative result may occur with  improper specimen collection/handling, submission of specimen other than nasopharyngeal swab, presence of viral mutation(s) within the areas targeted by this assay, and inadequate number of viral copies(<138 copies/mL). A negative result must be combined with clinical observations, patient history,  and epidemiological information. The expected result is Negative.  Fact Sheet for Patients:  EntrepreneurPulse.com.au  Fact Sheet for Healthcare Providers:  IncredibleEmployment.be  This test is no                          t yet approved or cleared by the [place] FDA and  has been authorized for detection and/or diagnosis of SARS-CoV-2 by FDA under an Emergency Use Authorization (EUA). This EUA will remain  in effect (meaning this test can be used) for the duration of the COVID-19 declaration under Section 564(b)(1) of the Act, 21 U.S.C.section 360bbb-3(b)(1), unless the authorization is terminated  or revoked sooner.       Influenza A by PCR 10/04/2021 NEGATIVE  NEGATIVE Final   Influenza B by PCR 10/04/2021 NEGATIVE  NEGATIVE Final   Comment: (NOTE) The Xpert Xpress SARS-CoV-2/FLU/RSV plus assay is intended as an aid in the diagnosis of influenza from Nasopharyngeal swab specimens and should not be used as a sole basis for treatment. Nasal washings and aspirates are unacceptable for Xpert Xpress SARS-CoV-2/FLU/RSV testing.  Fact Sheet for Patients: EntrepreneurPulse.com.au  Fact Sheet for Healthcare Providers: IncredibleEmployment.be  This test is not yet approved or cleared by the [place] FDA and has been authorized for detection and/or diagnosis of SARS-CoV-2 by FDA under an Emergency Use Authorization (EUA). This EUA will remain in effect (meaning this test can be used) for the duration of the COVID-19 declaration under Section 564(b)(1) of the Act, 21 U.S.C. section 360bbb-3(b)(1), unless the authorization is terminated or revoked.  Performed at [facility], [place] [address], [place], [place] [address]    WBC 10/04/2021 8.4  4.0 - 10.5 K/uL Final   RBC 10/04/2021 4.42  3.87 - 5.11  MIL/uL Final   Hemoglobin 10/04/2021 11.6 (L)  12.0 - 15.0 g/dL Final   HCT 29/93/7169 35.7 (L)   36.0 - 46.0 % Final   MCV 10/04/2021 80.8  80.0 - 100.0 fL Final   MCH 10/04/2021 26.2  26.0 - 34.0 pg Final   MCHC 10/04/2021 32.5  30.0 - 36.0 g/dL Final   RDW 67/89/3810 16.0 (H)  11.5 - 15.5 % Final   Platelets 10/04/2021 372  150 - 400 K/uL Final   nRBC 10/04/2021 0.0  0.0 - 0.2 % Final   Neutrophils Relative % 10/04/2021 68  % Final   Neutro Abs 10/04/2021 5.7  1.7 - 7.7 K/uL Final   Lymphocytes Relative 10/04/2021 22  % Final   Lymphs Abs 10/04/2021 1.8  0.7 - 4.0 K/uL Final   Monocytes Relative 10/04/2021 8  % Final   Monocytes Absolute 10/04/2021 0.7  0.1 - 1.0 K/uL Final   Eosinophils Relative 10/04/2021 1  % Final   Eosinophils Absolute 10/04/2021 0.1  0.0 - 0.5 K/uL Final   Basophils Relative 10/04/2021 1  % Final   Basophils Absolute 10/04/2021 0.1  0.0 - 0.1 K/uL Final   Immature Granulocytes 10/04/2021 0  % Final   Abs Immature Granulocytes 10/04/2021 0.03  0.00 - 0.07 K/uL Final   Performed at [facility], [address], [place], [place] [address]   Sodium 10/04/2021 136  135 - 145 mmol/L Final   Potassium 10/04/2021 4.2  3.5 - 5.1 mmol/L Final   Chloride 10/04/2021 104  98 - 111 mmol/L Final   CO2 10/04/2021 25  22 - 32 mmol/L Final   Glucose, Bld 10/04/2021 100 (H)  70 - 99 mg/dL Final   Glucose reference range applies only to samples taken after fasting for at least 8 hours.   BUN 10/04/2021 9  6 - 20 mg/dL Final   Creatinine, Ser 10/04/2021 0.52  0.44 - 1.00 mg/dL Final   Calcium 25/85/2778 9.0  8.9 - 10.3 mg/dL Final   Total Protein 24/23/5361 7.0  6.5 - 8.1 g/dL Final   Albumin 44/31/5400 3.2 (L)  3.5 - 5.0 g/dL Final   AST 86/76/1950 13 (L)  15 - 41 U/L Final   ALT 10/04/2021 10  0 - 44 U/L Final   Alkaline Phosphatase 10/04/2021 61  38 - 126 U/L Final   Total Bilirubin 10/04/2021 0.3  0.3 - 1.2 mg/dL Final   GFR, Estimated 10/04/2021 >60  >60 mL/min Final   Comment: (NOTE) Calculated using the CKD-EPI Creatinine Equation (2021)    Anion gap  10/04/2021 7  5 - 15 Final   Performed at [facility], [address] [place], [place] [address]   Hgb A1c MFr Bld 10/04/2021 7.0 (H)  4.8 - 5.6 % Final   Comment: (NOTE) Pre diabetes:          5.7%-6.4%  Diabetes:              >6.4%  Glycemic control for   <7.0% adults with diabetes    Mean Plasma Glucose 10/04/2021 154.2  mg/dL Final   Performed at [facility], [address] [place], [place] [address]   Cholesterol 10/04/2021 178  0 - 200 mg/dL Final   Triglycerides 09/98/3382 155 (H)  <150 mg/dL Final   HDL 50/53/9767 52  >40 mg/dL Final   Total CHOL/HDL Ratio 10/04/2021 3.4  RATIO Final   VLDL 10/04/2021 31  0 - 40 mg/dL Final  LDL Cholesterol 10/04/2021 95  0 - 99 mg/dL Final   Comment:        Total Cholesterol/HDL:CHD Risk Coronary Heart Disease Risk Table                     Men   Women  1/2 Average Risk   3.4   3.3  Average Risk       5.0   4.4  2 X Average Risk   9.6   7.1  3 X Average Risk  23.4   11.0        Use the calculated Patient Ratio above and the CHD Risk Table to determine the patient's CHD Risk.        ATP III CLASSIFICATION (LDL):  <100     mg/dL   Optimal  818-299  mg/dL   Near or Above                    Optimal  130-159  mg/dL   Borderline  371-696  mg/dL   High  >789     mg/dL   Very High Performed at [facility], [address] [place], [place] [address]    POC Amphetamine UR 10/04/2021 None Detected  NONE DETECTED (Cut Off Level 1000 ng/mL) Final   POC Secobarbital (BAR) 10/04/2021 None Detected  NONE DETECTED (Cut Off Level 300 ng/mL) Final   POC Buprenorphine (BUP) 10/04/2021 None Detected  NONE DETECTED (Cut Off Level 10 ng/mL) Final   POC Oxazepam (BZO) 10/04/2021 None Detected  NONE DETECTED (Cut Off Level 300 ng/mL) Final   POC Cocaine UR 10/04/2021 None Detected  NONE DETECTED (Cut Off Level 300 ng/mL) Final   POC Methamphetamine UR 10/04/2021 None Detected  NONE DETECTED (Cut Off Level 1000 ng/mL) Final   POC  Morphine 10/04/2021 None Detected  NONE DETECTED (Cut Off Level 300 ng/mL) Final   POC Methadone UR 10/04/2021 None Detected  NONE DETECTED (Cut Off Level 300 ng/mL) Final   POC Oxycodone UR 10/04/2021 Positive (A)  NONE DETECTED (Cut Off Level 100 ng/mL) Final   POC Marijuana UR 10/04/2021 None Detected  NONE DETECTED (Cut Off Level 50 ng/mL) Final   SARSCOV2ONAVIRUS 2 AG 10/04/2021 NEGATIVE  NEGATIVE Final   Comment: (NOTE) SARS-CoV-2 antigen NOT DETECTED.   Negative results are presumptive.  Negative results do not preclude SARS-CoV-2 infection and should not be used as the sole basis for treatment or other patient management decisions, including infection  control decisions, particularly in the presence of clinical signs and  symptoms consistent with COVID-19, or in those who have been in contact with the virus.  Negative results must be combined with clinical observations, patient history, and epidemiological information. The expected result is Negative.  Fact Sheet for Patients: https://www.jennings-kim.com/  Fact Sheet for Healthcare Providers: https://alexander-rogers.biz/  This test is not yet approved or cleared by the [place] FDA and  has been authorized for detection and/or diagnosis of SARS-CoV-2 by FDA under an Emergency Use Authorization (EUA).  This EUA will remain in effect (meaning this test can be used) for the duration of  the COV                          ID-19 declaration under Section 564(b)(1) of the Act, 21 U.S.C. section 360bbb-3(b)(1), unless the authorization is terminated or revoked sooner.     Valproic Acid Lvl 10/04/2021 51  50.0 - 100.0 ug/mL  Final   Performed at [facility], [place] [address] [place], [place] [address]  Admission on 09/09/2021, Discharged on 09/10/2021  Component Date Value Ref Range Status   Sodium 09/09/2021 134 (L)  135 - 145 mmol/L Final   Potassium 09/09/2021 4.3  3.5 - 5.1 mmol/L Final    Chloride 09/09/2021 99  98 - 111 mmol/L Final   CO2 09/09/2021 25  22 - 32 mmol/L Final   Glucose, Bld 09/09/2021 107 (H)  70 - 99 mg/dL Final   Glucose reference range applies only to samples taken after fasting for at least 8 hours.   BUN 09/09/2021 12  6 - 20 mg/dL Final   Creatinine, Ser 09/09/2021 0.74  0.44 - 1.00 mg/dL Final   Calcium 09/09/2021 9.0  8.9 - 10.3 mg/dL Final   Total Protein 09/09/2021 7.4  6.5 - 8.1 g/dL Final   Albumin 09/09/2021 3.1 (L)  3.5 - 5.0 g/dL Final   AST 09/09/2021 13 (L)  15 - 41 U/L Final   ALT 09/09/2021 13  0 - 44 U/L Final   Alkaline Phosphatase 09/09/2021 66  38 - 126 U/L Final   Total Bilirubin 09/09/2021 0.2 (L)  0.3 - 1.2 mg/dL Final   GFR, Estimated 09/09/2021 >60  >60 mL/min Final   Comment: (NOTE) Calculated using the CKD-EPI Creatinine Equation (2021)    Anion gap 09/09/2021 10  5 - 15 Final   Performed at [facility], [place] [address], [place], [place] [address]   WBC 09/09/2021 8.5  4.0 - 10.5 K/uL Final   RBC 09/09/2021 4.53  3.87 - 5.11 MIL/uL Final   Hemoglobin 09/09/2021 11.8 (L)  12.0 - 15.0 g/dL Final   HCT 09/09/2021 38.0  36.0 - 46.0 % Final   MCV 09/09/2021 83.9  80.0 - 100.0 fL Final   MCH 09/09/2021 26.0  26.0 - 34.0 pg Final   MCHC 09/09/2021 31.1  30.0 - 36.0 g/dL Final   RDW 09/09/2021 17.8 (H)  11.5 - 15.5 % Final   Platelets 09/09/2021 442 (H)  150 - 400 K/uL Final   nRBC 09/09/2021 0.0  0.0 - 0.2 % Final   Neutrophils Relative % 09/09/2021 70  % Final   Neutro Abs 09/09/2021 5.9  1.7 - 7.7 K/uL Final   Lymphocytes Relative 09/09/2021 20  % Final   Lymphs Abs 09/09/2021 1.7  0.7 - 4.0 K/uL Final   Monocytes Relative 09/09/2021 8  % Final   Monocytes Absolute 09/09/2021 0.7  0.1 - 1.0 K/uL Final   Eosinophils Relative 09/09/2021 1  % Final   Eosinophils Absolute 09/09/2021 0.1  0.0 - 0.5 K/uL Final   Basophils Relative 09/09/2021 1  % Final   Basophils Absolute 09/09/2021 0.1  0.0 - 0.1 K/uL Final   Immature  Granulocytes 09/09/2021 0  % Final   Abs Immature Granulocytes 09/09/2021 0.03  0.00 - 0.07 K/uL Final   Performed at [facility], [place] [address] [place], [place] [address]   Ammonia 09/09/2021 37 (H)  9 - 35 umol/L Final   Comment: HEMOLYSIS AT THIS LEVEL MAY AFFECT RESULT Performed at [facility], [place] [address], [place], [place] [address]    Opiates 09/09/2021 NONE DETECTED  NONE DETECTED Final   Cocaine 09/09/2021 NONE DETECTED  NONE DETECTED Final   Benzodiazepines 09/09/2021 NONE DETECTED  NONE DETECTED Final   Amphetamines 09/09/2021 NONE DETECTED  NONE DETECTED Final   Tetrahydrocannabinol 09/09/2021 NONE DETECTED  NONE DETECTED Final   Barbiturates 09/09/2021 NONE DETECTED  NONE DETECTED Final   Comment: (NOTE) DRUG SCREEN FOR MEDICAL PURPOSES ONLY.  IF CONFIRMATION IS NEEDED FOR ANY PURPOSE, NOTIFY LAB WITHIN 5 DAYS.  LOWEST DETECTABLE LIMITS FOR URINE DRUG SCREEN Drug Class                     Cutoff (ng/mL) Amphetamine and metabolites    1000 Barbiturate and metabolites    200 Benzodiazepine                 [id number] Tricyclics and metabolites     300 Opiates and metabolites        300 Cocaine and metabolites        300 THC                            50 Performed at [facility], [place] [address], [place], [place] [address]    Alcohol, Ethyl (B) 09/09/2021 <10  <10 mg/dL Final   Comment: (NOTE) Lowest detectable limit for serum alcohol is 10 mg/dL.  For medical purposes only. Performed at [facility], [place] [address], [place], [place] [address]    Lipase 09/09/2021 33  11 - 51 U/L Final   Performed at [place] [address], [place], [place] [address]   Color, Urine 09/09/2021 COLORLESS (A)  YELLOW Final   APPearance 09/09/2021 CLEAR  CLEAR Final   Specific Gravity, Urine 09/09/2021 1.002 (L)  1.005 - 1.030 Final   pH 09/09/2021 6.0  5.0 - 8.0 Final   Glucose, UA 09/09/2021 NEGATIVE  NEGATIVE mg/dL Final   Hgb urine dipstick  09/09/2021 NEGATIVE  NEGATIVE Final   Bilirubin Urine 09/09/2021 NEGATIVE  NEGATIVE Final   Ketones, ur 09/09/2021 NEGATIVE  NEGATIVE mg/dL Final   Protein, ur 09/09/2021 NEGATIVE  NEGATIVE mg/dL Final   Nitrite 09/09/2021 NEGATIVE  NEGATIVE Final   Leukocytes,Ua 09/09/2021 NEGATIVE  NEGATIVE Final   Performed at [place] [address] [place], [place] [address]   Specimen Description 09/09/2021 URINE, CLEAN CATCH   Final   Special Requests 09/09/2021 NONE   Final   Culture 09/09/2021  (A)   Final                   Value:<10,000 COLONIES/mL INSIGNIFICANT GROWTH Performed at [facility], [place] [address] [place], [place] [address]    Report Status 09/09/2021 09/10/2021 FINAL   Final   D-Dimer, Quant 09/09/2021 0.92 (H)  0.00 - 0.50 ug/mL-FEU Final   Comment: (NOTE) At the manufacturer cut-off value of 0.5 g/mL FEU, this assay has a negative predictive value of 95-100%.This assay is intended for use in conjunction with a clinical pretest probability (PTP) assessment model to exclude pulmonary embolism (PE) and deep venous thrombosis (DVT) in outpatients suspected of PE or DVT. Results should be correlated with clinical presentation. Performed at [facility], [place] [address], [place], [place] [address]    Troponin I (High Sensitivity) 09/09/2021 5  <18 ng/L Final   Comment: (NOTE) Elevated high sensitivity troponin I (hsTnI) values and significant  changes across serial measurements may suggest ACS but many other  chronic and acute conditions are known to elevate hsTnI results.  Refer to the "Links" section for chest pain algorithms and additional  guidance. Performed at [facility], [place] [address] [place], [place]  [address]    BP 09/10/2021 135/83  mmHg Final   S' Lateral 09/10/2021 2.30  cm Final   Area-P 1/2 09/10/2021 5.58  cm2 Final    Blood Alcohol level:  Lab Results  Component Value Date   ETH <10 09/09/2021   ETH <10 [id number]     Metabolic Disorder Labs: Lab Results  Component Value Date   HGBA1C 7.0 (H) 10/04/2021   MPG 154.2 10/04/2021   No results found for: "PROLACTIN" Lab Results  Component Value Date   CHOL 178 10/04/2021   TRIG 155 (H) 10/04/2021   HDL 52 10/04/2021   CHOLHDL 3.4 10/04/2021   VLDL 31 10/04/2021   [place] 95 10/04/2021    Therapeutic Lab Levels: No results found for: "LITHIUM" Lab Results  Component Value Date   VALPROATE 51 10/04/2021   VALPROATE 22 (L) 10/21/2020   No results found for: "CBMZ"  Physical Findings   Flowsheet Row ED from 10/04/2021 in [facility] ED from 09/09/2021 in [facility] ED from 08/22/2021 in [facility]  [place] No Risk No Risk No Risk        Musculoskeletal  Strength & Muscle Tone: within normal limits Gait & Station: normal Patient leans: N/A  Psychiatric Specialty Exam  Presentation  General Appearance: Appropriate for Environment  Eye Contact:Good  Speech:Clear and Coherent  Speech Volume:Normal  Handedness:Right   Mood and Affect  Mood:Euthymic  Affect:Constricted   Thought Process  Thought Processes:Coherent  Descriptions of Associations:Intact  Orientation:Full (Time, Place and Person)  Thought Content:Logical  Diagnosis of Schizophrenia or Schizoaffective disorder in past: Yes  Duration of Psychotic Symptoms: Greater than six months   Hallucinations:Hallucinations: None  Ideas of Reference:None  Suicidal Thoughts:Suicidal Thoughts: No  Homicidal Thoughts:Homicidal Thoughts: No   Sensorium  Memory:Immediate Fair; Recent Fair; Remote Fair  Judgment:Poor  Insight:Poor   Executive Functions  Concentration:Poor  Attention Span:Poor  Recall:Poor  Fund of Knowledge:Poor  Language:Poor   Psychomotor Activity  Psychomotor Activity:Psychomotor Activity: Normal   Assets   Assets:Communication Skills; Desire for Improvement   Sleep  Sleep:Sleep: Fair     Physical Exam  Physical Exam Constitutional:      Appearance: the patient is not toxic-appearing.  Pulmonary:     Effort: Pulmonary effort is normal.  Neurological:     General: No focal deficit present.     Mental Status: the patient is alert and oriented to person, place, and time.   Review of Systems  Respiratory:  Negative for shortness of breath.   Cardiovascular:  Negative for chest pain.  Gastrointestinal:  Negative for abdominal pain, constipation, diarrhea, nausea and vomiting.  Neurological:  Negative for headaches.   Blood pressure (!) 128/90, pulse 100, temperature 98 F (36.7 C), temperature source Oral, resp. rate 18, SpO2 100 %. There is no height or weight on file to calculate BMI.  Treatment Plan Summary: Daily contact with patient to assess and evaluate symptoms and progress in treatment and Medication management   Status: voluntary Disposition: will call family and assess if they can help, will contact [facility] to clarify level of services and medications  Hx of schizophrenia and aggression Continue home medications as listed -Haldol 10 mg 3 times daily - Cogentin 1 mg twice daily - Clozapine 50 mg twice daily - Ingrezza 40 mg daily - Depakote extended release 500 mg twice daily  Labs: VPA of 51 8/6, trough UDS with oxy A1C of 7  Hgb of 11.6  Corky Sox, MD 10/05/2021 11:14 AM

## 2021-10-05 NOTE — ED Notes (Signed)
Snacks given 

## 2021-10-05 NOTE — ED Notes (Signed)
Pt resting in bed. Respirations even and unlabored. Monitoring for safety. 

## 2021-10-05 NOTE — ED Notes (Signed)
Pt resting in bed. Respirations even and unlabored. No signs of distress noted. Will continue to monitor for safety.

## 2021-10-05 NOTE — ED Notes (Signed)
Pt up to bathroom .  Gait steady .  No distress noted.

## 2021-10-05 NOTE — ED Notes (Signed)
Sleeping since shift change no distress or sleep disturbance noted pt awakes on hearing her name and answers questions appropriately and easily returns to sleep.

## 2021-10-05 NOTE — ED Notes (Signed)
Remains asleep no distressed or disturbed sleep noted compliant with medications.

## 2021-10-05 NOTE — ED Provider Notes (Signed)
Clarified home medications with ACTT. Patient is not prescribed haldol but does take Zyprexa and clozapine. Orders adjusted to reflect this.   Carlyn Reichert, MD PGY-2

## 2021-10-06 ENCOUNTER — Other Ambulatory Visit: Payer: Self-pay

## 2021-10-06 NOTE — BH Assessment (Addendum)
LCSW Progress Note   0944 - [facility], [phone], to locate pt's care coordinator.  [phone] [facility] care coordinator, Aquilla Solian, [phone]. No answer and not able to leave a voicemail. LCSW sent email to [email or web address] and her supervisor, [email or web address] explaining the pt's circumstances and requesting assistance.  1245 - Contacted [employer], [phone], with Freder[facility]; redirected to send an email to [email or web address].  Email requesting assistance and information was sent.  1301 Valda Favia from [facility], [phone], contacted LCSW regarding pt.  LCSW informed a CCA within the last 30-45 days is needed stating she requires a higher level of care in either a group home or AFL. Ms. Dannielle Burn states she forwarded the information on to the back-up team who will handle this until her care coordinator returns on 09 October 2021.  LCSW was informed that pt is state-funded through [facility].    1315 - Ms. Towne calls back to explain a ROI will be sent for the pt to sign for [facility] and [employer] for information.  LCSW was supplied a number to Colan Neptune, [phone], for the pt to call her once the ROIs have been signed and returned.  Mariea Clonts will now be the POC with [facility] for this pt's case.  LCSW received fax @ 1430 and signed by the pt by 1440. Signed Release of Information was sent around 1445 to [email or web address].     Hansel Starling, MSW, LCSW [facility] [phone] phone [phone] registrar fax

## 2021-10-06 NOTE — ED Notes (Signed)
Remains asleep not signs of distress or disturbed sleep patterns sleeping soundly.

## 2021-10-06 NOTE — ED Notes (Signed)
Pt resting in bed. Respirations even and unlabored. Monitoring for safety. 

## 2021-10-06 NOTE — ED Notes (Signed)
Pt resting in bed, appropriately interacting with peers on unit. A&O x4, calm and cooperative. Denies current SI/HI/AVH. No signs of distress noted. Monitoring for safety.

## 2021-10-06 NOTE — ED Notes (Signed)
Pt was given dinner. 

## 2021-10-06 NOTE — ED Notes (Signed)
Patient has been pleasant this shift. Denies SI/HI/AVH. Asks for snacks frequently and requires redirection at times. She was moved from flex observation to the adult observation side, to bed 4.   Pt ate dinner and is now resting.

## 2021-10-06 NOTE — ED Notes (Signed)
Remains asleep no distress or disturb  sleep patterns noted respirations remain easy.

## 2021-10-06 NOTE — ED Provider Notes (Signed)
Behavioral Health Progress Note  Date and Time: 10/06/2021 9:04 AM Name: Teresa Murillo MRN:  [id number]  Subjective:   Teresa Murillo is a 61 year old female with a past psychiatric history of schizophrenia, aggressive behavior, and possible intellectual disability.  She presented to the [facility] behavioral health urgent care after an argument with staff members at her group home that resulted in her signing discharge paperwork and leaving.  Per previous documentation, the group home staff members have reported that the patient has a history of intellectual disability.  Cannot find this documented elsewhere in the chart.  It is certainly possible, as the patient is unable to state any of her home medications and is unable to perform simple math problems.  The patient reports that she receives a disability check each month. The patient is connected with [facility] but does not receive ACT services.   On assessment this morning, the patient is calm and cooperative.  Per nursing documentation the patient has also been calm and cooperative.  Presently she does not appear to be psychotic and she does not admit to any auditory or visual hallucinations.  She denies suicidal or homicidal thoughts.  She reports appropriate mood, sleep, and appetite.   The patient reports that she does not know any of her family members' phone numbers and states she does not think they will be helpful. LCSW contacted patient's group home yesterday, and they state that she will not be accepted back to their group home. LCSW did ask that they reach out to her [facility] care coordinator and touch base again.       Diagnosis:  Final diagnoses:  Schizophrenia, unspecified type (HCC)    Total Time spent with patient: 15 minutes  Past Psychiatric History: as above Past Medical History:  Past Medical History:  Diagnosis Date   Chronic obstructive pulmonary disease (COPD) (HCC)    Glaucoma    Hyperlipidemia     Hypertension    Iron deficiency    Schizoaffective disorder (HCC)    Type 2 diabetes mellitus (HCC)     Past Surgical History:  Procedure Laterality Date   TUBAL LIGATION     Family History:  Family History  Problem Relation Age of Onset   Breast cancer Maternal Grandmother    Family Psychiatric  History: as per H and P Social History:  Social History   Substance and Sexual Activity  Alcohol Use Yes     Social History   Substance and Sexual Activity  Drug Use Not Currently    Social History   Socioeconomic History   Marital status: Divorced    Spouse name: Not on file   Number of children: Not on file   Years of education: Not on file   Highest education level: Not on file  Occupational History   Not on file  Tobacco Use   Smoking status: Every Day    Packs/day: 1.00    Types: Cigars, Cigarettes   Smokeless tobacco: Current  Vaping Use   Vaping Use: Never used  Substance and Sexual Activity   Alcohol use: Yes   Drug use: Not Currently   Sexual activity: Not Currently    Birth control/protection: Surgical  Other Topics Concern   Not on file  Social History Narrative   Not on file   Social Determinants of Health   Financial Resource Strain: Not on file  Food Insecurity: Not on file  Transportation Needs: Not on file  Physical Activity: Not on file  Stress: Not on file  Social Connections: Not on file   SDOH:  SDOH Screenings   Alcohol Screen: Not on file  Depression (PHQ2-9): Not on file  Financial Resource Strain: Not on file  Food Insecurity: Not on file  Housing: Not on file  Physical Activity: Not on file  Social Connections: Not on file  Stress: Not on file  Tobacco Use: High Risk (09/09/2021)   Patient History    Smoking Tobacco Use: Every Day    Smokeless Tobacco Use: Current    Passive Exposure: Not on file  Transportation Needs: Not on file   Additional Social History:    Pain Medications: See MAR Prescriptions: See MAR Over  the Counter: See MAR History of alcohol / drug use?: No history of alcohol / drug abuse Longest period of sobriety (when/how long): N/A Negative Consequences of Use:  (N/A) Withdrawal Symptoms:  (N/A)                    Sleep: Fair  Appetite:  Fair  Current Medications:  Current Facility-Administered Medications  Medication Dose Route Frequency Provider Last Rate Last Admin   acetaminophen (TYLENOL) tablet 650 mg  650 mg Oral Q6H PRN Lauree Chandler, NP       alum & mag hydroxide-simeth (MAALOX/MYLANTA) 200-200-20 MG/5ML suspension 30 mL  30 mL Oral Q4H PRN Lauree Chandler, NP       benztropine (COGENTIN) tablet 1 mg  1 mg Oral BID Lauree Chandler, NP   1 mg at 10/05/21 2203   cloZAPine (CLOZARIL) tablet 50 mg  50 mg Oral BID Lauree Chandler, NP   50 mg at 10/05/21 2203   divalproex (DEPAKOTE ER) 24 hr tablet 500 mg  500 mg Oral BID Lauree Chandler, NP   500 mg at 10/05/21 2204   latanoprost (XALATAN) 0.005 % ophthalmic solution 1 drop  1 drop Both Eyes QHS Lauree Chandler, NP   1 drop at 10/05/21 2211   lisinopril (ZESTRIL) tablet 10 mg  10 mg Oral Daily Lauree Chandler, NP   10 mg at 10/05/21 5329   magnesium hydroxide (MILK OF MAGNESIA) suspension 30 mL  30 mL Oral Daily PRN Lauree Chandler, NP       metFORMIN (GLUCOPHAGE) tablet 500 mg  500 mg Oral BID WC Lauree Chandler, NP   500 mg at 10/05/21 1616   mometasone-formoterol (DULERA) 200-5 MCG/ACT inhaler 2 puff  2 puff Inhalation BID Lauree Chandler, NP   2 puff at 10/05/21 1931   nicotine (NICODERM CQ - dosed in mg/24 hr) patch 7 mg  7 mg Transdermal Daily Lauree Chandler, NP   7 mg at 10/05/21 0921   OLANZapine (ZYPREXA) tablet 20 mg  20 mg Oral QHS Carlyn Reichert, MD   20 mg at 10/05/21 2203   valbenazine (INGREZZA) capsule 40 mg  40 mg Oral q AM Lauree Chandler, NP   40 mg at 10/06/21 9242   Current Outpatient Medications  Medication Sig Dispense Refill   benztropine  (COGENTIN) 1 MG tablet Take 1 mg by mouth in the morning and at bedtime.     budesonide-formoterol (SYMBICORT) 160-4.5 MCG/ACT inhaler Inhale 2 puffs into the lungs in the morning and at bedtime.     Cholecalciferol (VITAMIN D3) 1.25 MG (50000 UT) CAPS Take 50,000 Units by mouth every Thursday.     clozapine (CLOZARIL) 50 MG tablet Take 50 mg by mouth 2 (two) times daily.  divalproex (DEPAKOTE ER) 500 MG 24 hr tablet Take 500 mg by mouth in the morning and at bedtime.     haloperidol (HALDOL) 10 MG tablet Take 10 mg by mouth 3 (three) times daily.     INGREZZA 40 MG capsule Take 40 mg by mouth in the morning.     latanoprost (XALATAN) 0.005 % ophthalmic solution Place 1 drop into both eyes at bedtime.     lisinopril (ZESTRIL) 10 MG tablet Take 10 mg by mouth daily.     metFORMIN (GLUCOPHAGE) 500 MG tablet Take 500 mg by mouth 2 (two) times daily with a meal.     nicotine (NICODERM CQ - DOSED IN MG/24 HR) 7 mg/24hr patch Place 7 mg onto the skin daily.     OLANZapine-Samidorphan (LYBALVI) 20-10 MG TABS Take 1 tablet by mouth at bedtime.     PROAIR HFA 108 (90 Base) MCG/ACT inhaler Inhale 2 puffs into the lungs every 6 (six) hours as needed for wheezing or shortness of breath.     zolpidem (AMBIEN) 10 MG tablet Take 10 mg by mouth at bedtime.      Labs  Lab Results:  Admission on 10/04/2021  Component Date Value Ref Range Status   SARS Coronavirus 2 by RT PCR 10/04/2021 NEGATIVE  NEGATIVE Final   Comment: (NOTE) SARS-CoV-2 target nucleic acids are NOT DETECTED.  The SARS-CoV-2 RNA is generally detectable in upper respiratory specimens during the acute phase of infection. The lowest concentration of SARS-CoV-2 viral copies this assay can detect is 138 copies/mL. A negative result does not preclude SARS-Cov-2 infection and should not be used as the sole basis for treatment or other patient management decisions. A negative result may occur with  improper specimen collection/handling,  submission of specimen other than nasopharyngeal swab, presence of viral mutation(s) within the areas targeted by this assay, and inadequate number of viral copies(<138 copies/mL). A negative result must be combined with clinical observations, patient history, and epidemiological information. The expected result is Negative.  Fact Sheet for Patients:  BloggerCourse.com  Fact Sheet for Healthcare Providers:  SeriousBroker.it  This test is no                          t yet approved or cleared by the [place] FDA and  has been authorized for detection and/or diagnosis of SARS-CoV-2 by FDA under an Emergency Use Authorization (EUA). This EUA will remain  in effect (meaning this test can be used) for the duration of the COVID-19 declaration under Section 564(b)(1) of the Act, 21 U.S.C.section 360bbb-3(b)(1), unless the authorization is terminated  or revoked sooner.       Influenza A by PCR 10/04/2021 NEGATIVE  NEGATIVE Final   Influenza B by PCR 10/04/2021 NEGATIVE  NEGATIVE Final   Comment: (NOTE) The Xpert Xpress SARS-CoV-2/FLU/RSV plus assay is intended as an aid in the diagnosis of influenza from Nasopharyngeal swab specimens and should not be used as a sole basis for treatment. Nasal washings and aspirates are unacceptable for Xpert Xpress SARS-CoV-2/FLU/RSV testing.  Fact Sheet for Patients: BloggerCourse.com  Fact Sheet for Healthcare Providers: SeriousBroker.it  This test is not yet approved or cleared by the [place] FDA and has been authorized for detection and/or diagnosis of SARS-CoV-2 by FDA under an Emergency Use Authorization (EUA). This EUA will remain in effect (meaning this test can be used) for the duration of the COVID-19 declaration under Section 564(b)(1) of the Act, 21 U.S.C.  section 360bbb-3(b)(1), unless the authorization is terminated  or revoked.  Performed at [facility] Lab, [address] [place], [place] [address]    WBC 10/04/2021 8.4  4.0 - 10.5 K/uL Final   RBC 10/04/2021 4.42  3.87 - 5.11 MIL/uL Final   Hemoglobin 10/04/2021 11.6 (L)  12.0 - 15.0 g/dL Final   HCT 23/76/2831 35.7 (L)  36.0 - 46.0 % Final   MCV 10/04/2021 80.8  80.0 - 100.0 fL Final   MCH 10/04/2021 26.2  26.0 - 34.0 pg Final   MCHC 10/04/2021 32.5  30.0 - 36.0 g/dL Final   RDW 51/76/1607 16.0 (H)  11.5 - 15.5 % Final   Platelets 10/04/2021 372  150 - 400 K/uL Final   nRBC 10/04/2021 0.0  0.0 - 0.2 % Final   Neutrophils Relative % 10/04/2021 68  % Final   Neutro Abs 10/04/2021 5.7  1.7 - 7.7 K/uL Final   Lymphocytes Relative 10/04/2021 22  % Final   Lymphs Abs 10/04/2021 1.8  0.7 - 4.0 K/uL Final   Monocytes Relative 10/04/2021 8  % Final   Monocytes Absolute 10/04/2021 0.7  0.1 - 1.0 K/uL Final   Eosinophils Relative 10/04/2021 1  % Final   Eosinophils Absolute 10/04/2021 0.1  0.0 - 0.5 K/uL Final   Basophils Relative 10/04/2021 1  % Final   Basophils Absolute 10/04/2021 0.1  0.0 - 0.1 K/uL Final   Immature Granulocytes 10/04/2021 0  % Final   Abs Immature Granulocytes 10/04/2021 0.03  0.00 - 0.07 K/uL Final   Performed at [facility], [address], [place], [place] [address]   Sodium 10/04/2021 136  135 - 145 mmol/L Final   Potassium 10/04/2021 4.2  3.5 - 5.1 mmol/L Final   Chloride 10/04/2021 104  98 - 111 mmol/L Final   CO2 10/04/2021 25  22 - 32 mmol/L Final   Glucose, Bld 10/04/2021 100 (H)  70 - 99 mg/dL Final   Glucose reference range applies only to samples taken after fasting for at least 8 hours.   BUN 10/04/2021 9  6 - 20 mg/dL Final   Creatinine, Ser 10/04/2021 0.52  0.44 - 1.00 mg/dL Final   Calcium 26/94/8546 9.0  8.9 - 10.3 mg/dL Final   Total Protein 27/04/5007 7.0  6.5 - 8.1 g/dL Final   Albumin 38/18/2993 3.2 (L)  3.5 - 5.0 g/dL Final   AST 71/69/6789 13 (L)  15 - 41 U/L Final   ALT 10/04/2021 10  0 -  44 U/L Final   Alkaline Phosphatase 10/04/2021 61  38 - 126 U/L Final   Total Bilirubin 10/04/2021 0.3  0.3 - 1.2 mg/dL Final   GFR, Estimated 10/04/2021 >60  >60 mL/min Final   Comment: (NOTE) Calculated using the CKD-EPI Creatinine Equation (2021)    Anion gap 10/04/2021 7  5 - 15 Final   Performed at [facility], [address], [place], [place] [address]   Hgb A1c MFr Bld 10/04/2021 7.0 (H)  4.8 - 5.6 % Final   Comment: (NOTE) Pre diabetes:          5.7%-6.4%  Diabetes:              >6.4%  Glycemic control for   <7.0% adults with diabetes    Mean Plasma Glucose 10/04/2021 154.2  mg/dL Final   Performed at [facility] Lab, [address], [place], [place] [address]   Cholesterol 10/04/2021 178  0 -  200 mg/dL Final   Triglycerides 16/10/960408/07/2021 155 (H)  <150 mg/dL Final   HDL 54/09/811908/07/2021 52  >40 mg/dL Final   Total CHOL/HDL Ratio 10/04/2021 3.4  RATIO Final   VLDL 10/04/2021 31  0 - 40 mg/dL Final   LDL Cholesterol 10/04/2021 95  0 - 99 mg/dL Final   Comment:        Total Cholesterol/HDL:CHD Risk Coronary Heart Disease Risk Table                     Men   Women  1/2 Average Risk   3.4   3.3  Average Risk       5.0   4.4  2 X Average Risk   9.6   7.1  3 X Average Risk  23.4   11.0        Use the calculated Patient Ratio above and the CHD Risk Table to determine the patient's CHD Risk.        ATP III CLASSIFICATION (LDL):  <100     mg/dL   Optimal  147-829100-129  mg/dL   Near or Above                    Optimal  130-159  mg/dL   Borderline  562-130160-189  mg/dL   High  >865>190     mg/dL   Very High Performed at [facility], [address] [place], [place] [id number]    POC Amphetamine UR 10/04/2021 None Detected  NONE DETECTED (Cut Off Level 1000 ng/mL) Final   POC Secobarbital (BAR) 10/04/2021 None Detected  NONE DETECTED (Cut Off Level 300 ng/mL) Final   POC Buprenorphine (BUP) 10/04/2021 None Detected  NONE DETECTED (Cut Off Level 10 ng/mL) Final   POC Oxazepam  (BZO) 10/04/2021 None Detected  NONE DETECTED (Cut Off Level 300 ng/mL) Final   POC Cocaine UR 10/04/2021 None Detected  NONE DETECTED (Cut Off Level 300 ng/mL) Final   POC Methamphetamine UR 10/04/2021 None Detected  NONE DETECTED (Cut Off Level 1000 ng/mL) Final   POC Morphine 10/04/2021 None Detected  NONE DETECTED (Cut Off Level 300 ng/mL) Final   POC Methadone UR 10/04/2021 None Detected  NONE DETECTED (Cut Off Level 300 ng/mL) Final   POC Oxycodone UR 10/04/2021 Positive (A)  NONE DETECTED (Cut Off Level 100 ng/mL) Final   POC Marijuana UR 10/04/2021 None Detected  NONE DETECTED (Cut Off Level 50 ng/mL) Final   SARSCOV2ONAVIRUS 2 AG 10/04/2021 NEGATIVE  NEGATIVE Final   Comment: (NOTE) SARS-CoV-2 antigen NOT DETECTED.   Negative results are presumptive.  Negative results do not preclude SARS-CoV-2 infection and should not be used as the sole basis for treatment or other patient management decisions, including infection  control decisions, particularly in the presence of clinical signs and  symptoms consistent with COVID-19, or in those who have been in contact with the virus.  Negative results must be combined with clinical observations, patient history, and epidemiological information. The expected result is Negative.  Fact Sheet for Patients: https://www.jennings-kim.com/https://www.fda.gov/media/141569/download  Fact Sheet for Healthcare Providers: https://alexander-rogers.biz/https://www.fda.gov/media/141568/download  This test is not yet approved or cleared by the [place] FDA and  has been authorized for detection and/or diagnosis of SARS-CoV-2 by FDA under an Emergency Use Authorization (EUA).  This EUA will remain in effect (meaning this test can be used) for the duration of  the COV  ID-19 declaration under Section 564(b)(1) of the Act, 21 U.S.C. section 360bbb-3(b)(1), unless the authorization is terminated or revoked sooner.     Valproic Acid Lvl 10/04/2021 51  50.0 - 100.0 ug/mL Final    Performed at [facility], [address], [place], [place] [address]  Admission on 09/09/2021, Discharged on 09/10/2021  Component Date Value Ref Range Status   Sodium 09/09/2021 134 (L)  135 - 145 mmol/L Final   Potassium 09/09/2021 4.3  3.5 - 5.1 mmol/L Final   Chloride 09/09/2021 99  98 - 111 mmol/L Final   CO2 09/09/2021 25  22 - 32 mmol/L Final   Glucose, Bld 09/09/2021 107 (H)  70 - 99 mg/dL Final   Glucose reference range applies only to samples taken after fasting for at least 8 hours.   BUN 09/09/2021 12  6 - 20 mg/dL Final   Creatinine, Ser 09/09/2021 0.74  0.44 - 1.00 mg/dL Final   Calcium 60/45/4098 9.0  8.9 - 10.3 mg/dL Final   Total Protein 11/91/4782 7.4  6.5 - 8.1 g/dL Final   Albumin 95/62/1308 3.1 (L)  3.5 - 5.0 g/dL Final   AST 65/78/4696 13 (L)  15 - 41 U/L Final   ALT 09/09/2021 13  0 - 44 U/L Final   Alkaline Phosphatase 09/09/2021 66  38 - 126 U/L Final   Total Bilirubin 09/09/2021 0.2 (L)  0.3 - 1.2 mg/dL Final   GFR, Estimated 09/09/2021 >60  >60 mL/min Final   Comment: (NOTE) Calculated using the CKD-EPI Creatinine Equation (2021)    Anion gap 09/09/2021 10  5 - 15 Final   Performed at [facility], [address] [place], [place] [address]   WBC 09/09/2021 8.5  4.0 - 10.5 K/uL Final   RBC 09/09/2021 4.53  3.87 - 5.11 MIL/uL Final   Hemoglobin 09/09/2021 11.8 (L)  12.0 - 15.0 g/dL Final   HCT 41/32/4401 38.0  36.0 - 46.0 % Final   MCV 09/09/2021 83.9  80.0 - 100.0 fL Final   MCH 09/09/2021 26.0  26.0 - 34.0 pg Final   MCHC 09/09/2021 31.1  30.0 - 36.0 g/dL Final   RDW 02/72/5366 17.8 (H)  11.5 - 15.5 % Final   Platelets 09/09/2021 442 (H)  150 - 400 K/uL Final   nRBC 09/09/2021 0.0  0.0 - 0.2 % Final   Neutrophils Relative % 09/09/2021 70  % Final   Neutro Abs 09/09/2021 5.9  1.7 - 7.7 K/uL Final   Lymphocytes Relative 09/09/2021 20  % Final   Lymphs Abs 09/09/2021 1.7  0.7 - 4.0 K/uL Final   Monocytes Relative 09/09/2021 8  % Final    Monocytes Absolute 09/09/2021 0.7  0.1 - 1.0 K/uL Final   Eosinophils Relative 09/09/2021 1  % Final   Eosinophils Absolute 09/09/2021 0.1  0.0 - 0.5 K/uL Final   Basophils Relative 09/09/2021 1  % Final   Basophils Absolute 09/09/2021 0.1  0.0 - 0.1 K/uL Final   Immature Granulocytes 09/09/2021 0  % Final   Abs Immature Granulocytes 09/09/2021 0.03  0.00 - 0.07 K/uL Final   Performed at [facility], [address] [place], [place] [address]   Ammonia 09/09/2021 37 (H)  9 - 35 umol/L Final   Comment: HEMOLYSIS AT THIS LEVEL MAY AFFECT RESULT Performed at [facility], [address] [place], [place] [address]    Opiates 09/09/2021 NONE DETECTED  NONE DETECTED Final   Cocaine  09/09/2021 NONE DETECTED  NONE DETECTED Final   Benzodiazepines 09/09/2021 NONE DETECTED  NONE DETECTED Final   Amphetamines 09/09/2021 NONE DETECTED  NONE DETECTED Final   Tetrahydrocannabinol 09/09/2021 NONE DETECTED  NONE DETECTED Final   Barbiturates 09/09/2021 NONE DETECTED  NONE DETECTED Final   Comment: (NOTE) DRUG SCREEN FOR MEDICAL PURPOSES ONLY.  IF CONFIRMATION IS NEEDED FOR ANY PURPOSE, NOTIFY LAB WITHIN 5 DAYS.  LOWEST DETECTABLE LIMITS FOR URINE DRUG SCREEN Drug Class                     Cutoff (ng/mL) Amphetamine and metabolites    1000 Barbiturate and metabolites    200 Benzodiazepine                 200 Tricyclics and metabolites     300 Opiates and metabolites        300 Cocaine and metabolites        300 THC                            50 Performed at [facility], [address], [place], [place] [address]    Alcohol, Ethyl (B) 09/09/2021 <10  <10 mg/dL Final   Comment: (NOTE) Lowest detectable limit for serum alcohol is 10 mg/dL.  For medical purposes only. Performed at [facility], [address] [place], [place] [address]    Lipase 09/09/2021 33  11 - 51 U/L Final   Performed at [facility], [address], [place], [place] [address]   Color,  Urine 09/09/2021 COLORLESS (A)  YELLOW Final   APPearance 09/09/2021 CLEAR  CLEAR Final   Specific Gravity, Urine 09/09/2021 1.002 (L)  1.005 - 1.030 Final   pH 09/09/2021 6.0  5.0 - 8.0 Final   Glucose, UA 09/09/2021 NEGATIVE  NEGATIVE mg/dL Final   Hgb urine dipstick 09/09/2021 NEGATIVE  NEGATIVE Final   Bilirubin Urine 09/09/2021 NEGATIVE  NEGATIVE Final   Ketones, ur 09/09/2021 NEGATIVE  NEGATIVE mg/dL Final   Protein, ur 84/69/6295 NEGATIVE  NEGATIVE mg/dL Final   Nitrite 28/41/3244 NEGATIVE  NEGATIVE Final   Leukocytes,Ua 09/09/2021 NEGATIVE  NEGATIVE Final   Performed at [facility], [address], [place], [place] [address]   Specimen Description 09/09/2021 URINE, CLEAN CATCH   Final   Special Requests 09/09/2021 NONE   Final   Culture 09/09/2021  (A)   Final                   Value:<10,000 COLONIES/mL INSIGNIFICANT GROWTH Performed at [facility], [address], [place], [place] [address]    Report Status 09/09/2021 09/10/2021 FINAL   Final   D-Dimer, Quant 09/09/2021 0.92 (H)  0.00 - 0.50 ug/mL-FEU Final   Comment: (NOTE) At the manufacturer cut-off value of 0.5 g/mL FEU, this assay has a negative predictive value of 95-100%.This assay is intended for use in conjunction with a clinical pretest probability (PTP) assessment model to exclude pulmonary embolism (PE) and deep venous thrombosis (DVT) in outpatients suspected of PE or DVT. Results should be correlated with clinical presentation. Performed at [facility], [address], [place], [place] [address]    Troponin I (High Sensitivity) 09/09/2021 5  <18 ng/L Final   Comment: (NOTE) Elevated high sensitivity troponin I (hsTnI) values and significant  changes across serial measurements may suggest ACS but many other  chronic and  acute conditions are known to elevate hsTnI results.  Refer to the "Links" section for chest pain algorithms and additional  guidance. Performed at [facility]  Lab, [address] [place], [place] [address]    BP 09/10/2021 135/83  mmHg Final   S' Lateral 09/10/2021 2.30  cm Final   Area-P 1/2 09/10/2021 5.58  cm2 Final    Blood Alcohol level:  Lab Results  Component Value Date   ETH <10 09/09/2021   ETH <10 10/20/2020    Metabolic Disorder Labs: Lab Results  Component Value Date   HGBA1C 7.0 (H) 10/04/2021   MPG 154.2 10/04/2021   No results found for: "PROLACTIN" Lab Results  Component Value Date   CHOL 178 10/04/2021   TRIG 155 (H) 10/04/2021   HDL 52 10/04/2021   CHOLHDL 3.4 10/04/2021   VLDL 31 10/04/2021   LDLCALC 95 10/04/2021    Therapeutic Lab Levels: No results found for: "LITHIUM" Lab Results  Component Value Date   VALPROATE 51 10/04/2021   VALPROATE 22 (L) 10/21/2020   No results found for: "CBMZ"  Physical Findings   Flowsheet Row ED from 10/04/2021 in [facility] ED from 09/09/2021 in [facility] EMERGENCY DEPART[facility] ED from 08/22/2021 in [facility] Dept  C-SSRS RISK CATEGORY No Risk No Risk No Risk        Musculoskeletal  Strength & Muscle Tone: within normal limits Gait & Station: normal Patient leans: N/A  Psychiatric Specialty Exam  Presentation  General Appearance: Appropriate for Environment  Eye Contact:Good  Speech:Clear and Coherent  Speech Volume:Normal  Handedness:Right   Mood and Affect  Mood:Euthymic  Affect:Constricted   Thought Process  Thought Processes:Coherent; Linear  Descriptions of Associations:Intact  Orientation:Full (Time, Place and Person)  Thought Content:Logical  Diagnosis of Schizophrenia or Schizoaffective disorder in past: Yes  Duration of Psychotic Symptoms: Greater than six months   Hallucinations:Hallucinations: None  Ideas of Reference:None  Suicidal Thoughts:Suicidal Thoughts: No  Homicidal Thoughts:Homicidal Thoughts: No   Sensorium  Memory:Immediate Fair; Recent Fair;  Remote Fair  Judgment:Poor  Insight:Poor   Executive Functions  Concentration:Poor  Attention Span:Poor  Recall:Poor  Fund of Knowledge:Poor  Language:Poor   Psychomotor Activity  Psychomotor Activity:Psychomotor Activity: Normal   Assets  Assets:Communication Skills; Desire for Improvement   Sleep  Sleep:Sleep: Fair   Physical Exam  Physical Exam Constitutional:      Appearance: the patient is not toxic-appearing.  Pulmonary:     Effort: Pulmonary effort is normal.  Neurological:     General: No focal deficit present.     Mental Status: the patient is alert and oriented to person, place, and time.   Review of Systems  Respiratory:  Negative for shortness of breath.   Cardiovascular:  Negative for chest pain.  Gastrointestinal:  Negative for abdominal pain, constipation, diarrhea, nausea and vomiting.  Neurological:  Negative for headaches.   Blood pressure 110/87, pulse 92, temperature 97.6 F (36.4 C), temperature source Oral, resp. rate 20, SpO2 97 %. There is no height or weight on file to calculate BMI.  Treatment Plan Summary: Daily contact with patient to assess and evaluate symptoms and progress in treatment and Medication management  Status: Voluntary Disposition: TBD, difficult to place given ID and schizophrenia with aggression  -LCSW to follow up with   Hx of schizophrenia and aggression Continue home medications as listed - Discontinued Haldol 10 mg 3 times daily because this is not currently being prescribed by her  outpatient provider -- Continue Zyprexa 20 mg QHS (in place of Lybalvi) - Continue Cogentin 1 mg twice daily - Continue Clozapine 50 mg twice daily, ACTT confirms patient is prescribed this medicine - Ingrezza 40 mg daily - Depakote extended release 500 mg twice daily  VPA level due 8/10 AM   Labs: VPA trough of 51 8/6 UDS with oxy A1C of 7 Hgb of 11.6 MCV of 80, will need outpatient follow up   Carlyn Reichert,  MD 10/06/2021 9:04 AM

## 2021-10-06 NOTE — ED Notes (Signed)
Sleeping with no initiation of spontaneous interactions with others, easily awakened for medications but returns directly back to sleep.

## 2021-10-07 NOTE — ED Provider Notes (Signed)
Behavioral Health Progress Note  Date and Time: 10/07/2021 11:07 AM Name: Teresa Murillo MRN:  [id number]  Subjective:   Teresa Murillo is a 61 year old female with a past psychiatric history of schizophrenia, aggressive behavior, and possible intellectual disability.  She presented to the [facility] behavioral health urgent care after an argument with staff members at her group home that resulted in her signing discharge paperwork and leaving.  Per previous documentation, the group home staff members have reported that the patient has a history of intellectual disability.  Cannot find this documented elsewhere in the chart.  It is certainly possible, as the patient is unable to state any of her home medications and is unable to perform simple math problems.  The patient reports that she receives a disability check each month. The patient is connected with [facility] but does not receive ACT services.   On assessment this morning, the patient is calm and cooperative.  Per nursing documentation the patient has also been calm and cooperative.  Presently she does not appear to be psychotic and she does not admit to any auditory or visual hallucinations.  She denies suicidal or homicidal thoughts.  She reports appropriate mood, sleep, and appetite.   LCSW has contacted patient's care coordinator multiple times.  Ms. Nyoka Cowden reports that she is trying to find a place for the patient to go, potentially an AFL.  LCSW also contacted group home and stated that there would be some interested in taking the patient back if she is "stabilized on the right medication".  Discussed that this medication will be Caplyta which has been not paid for by [facility] for the patient recently.    Diagnosis:  Final diagnoses:  Schizophrenia, unspecified type ([place])    Total Time spent with patient: 15 minutes  Past Psychiatric History: as above Past Medical History:  Past Medical History:  Diagnosis Date    Chronic obstructive pulmonary disease (COPD) ([place])    Glaucoma    Hyperlipidemia    Hypertension    Iron deficiency    Schizoaffective disorder ([place])    Type 2 diabetes mellitus ([place])     Past Surgical History:  Procedure Laterality Date   TUBAL LIGATION     Family History:  Family History  Problem Relation Age of Onset   Breast cancer Maternal Grandmother    Family Psychiatric  History: as per H and P Social History:  Social History   Substance and Sexual Activity  Alcohol Use Yes     Social History   Substance and Sexual Activity  Drug Use Not Currently    Social History   Socioeconomic History   Marital status: Divorced    Spouse name: Not on file   Number of children: Not on file   Years of education: Not on file   Highest education level: Not on file  Occupational History   Not on file  Tobacco Use   Smoking status: Every Day    Packs/day: 1.00    Types: Cigars, Cigarettes   Smokeless tobacco: Current  Vaping Use   Vaping Use: Never used  Substance and Sexual Activity   Alcohol use: Yes   Drug use: Not Currently   Sexual activity: Not Currently    Birth control/protection: Surgical  Other Topics Concern   Not on file  Social History Narrative   Not on file   Social Determinants of Health   Financial Resource Strain: Not on file  Food Insecurity: Not on file  Transportation  Needs: Not on file  Physical Activity: Not on file  Stress: Not on file  Social Connections: Not on file   SDOH:  SDOH Screenings   Alcohol Screen: Not on file  Depression (PHQ2-9): Not on file  Financial Resource Strain: Not on file  Food Insecurity: Not on file  Housing: Not on file  Physical Activity: Not on file  Social Connections: Not on file  Stress: Not on file  Tobacco Use: High Risk (09/09/2021)   Patient History    Smoking Tobacco Use: Every Day    Smokeless Tobacco Use: Current    Passive Exposure: Not on file  Transportation Needs: Not on file    Additional Social History:    Pain Medications: See MAR Prescriptions: See MAR Over the Counter: See MAR History of alcohol / drug use?: No history of alcohol / drug abuse Longest period of sobriety (when/how long): N/A Negative Consequences of Use:  (N/A) Withdrawal Symptoms:  (N/A)                    Sleep: Fair  Appetite:  Fair  Current Medications:  Current Facility-Administered Medications  Medication Dose Route Frequency Provider Last Rate Last Admin   acetaminophen (TYLENOL) tablet 650 mg  650 mg Oral Q6H PRN Tharon Aquas, NP       alum & mag hydroxide-simeth (MAALOX/MYLANTA) 200-200-20 MG/5ML suspension 30 mL  30 mL Oral Q4H PRN Tharon Aquas, NP       benztropine (COGENTIN) tablet 1 mg  1 mg Oral BID Tharon Aquas, NP   1 mg at 10/07/21 [id number]   cloZAPine (CLOZARIL) tablet 50 mg  50 mg Oral BID Tharon Aquas, NP   50 mg at 10/07/21 [id number]   divalproex (DEPAKOTE ER) 24 hr tablet 500 mg  500 mg Oral BID Tharon Aquas, NP   500 mg at 10/07/21 0938   latanoprost (XALATAN) 0.005 % ophthalmic solution 1 drop  1 drop Both Eyes QHS Tharon Aquas, NP   1 drop at 10/06/21 2121   lisinopril (ZESTRIL) tablet 10 mg  10 mg Oral Daily Tharon Aquas, NP   10 mg at 10/07/21 [id number]   magnesium hydroxide (MILK OF MAGNESIA) suspension 30 mL  30 mL Oral Daily PRN Tharon Aquas, NP       metFORMIN (GLUCOPHAGE) tablet 500 mg  500 mg Oral BID WC Tharon Aquas, NP   500 mg at 10/07/21 0847   mometasone-formoterol (DULERA) 200-5 MCG/ACT inhaler 2 puff  2 puff Inhalation BID Tharon Aquas, NP   2 puff at 10/07/21 0847   nicotine (NICODERM CQ - dosed in mg/24 hr) patch 7 mg  7 mg Transdermal Daily Tharon Aquas, NP   7 mg at 10/07/21 0938   OLANZapine (ZYPREXA) tablet 20 mg  20 mg Oral QHS Corky Sox, MD   20 mg at 10/06/21 2118   valbenazine (INGREZZA) capsule 40 mg  40 mg Oral q AM Tharon Aquas, NP   40 mg at 10/07/21  [id number]   Current Outpatient Medications  Medication Sig Dispense Refill   benztropine (COGENTIN) 1 MG tablet Take 1 mg by mouth in the morning and at bedtime.     budesonide-formoterol (SYMBICORT) 160-4.5 MCG/ACT inhaler Inhale 2 puffs into the lungs in the morning and at bedtime.     Cholecalciferol (VITAMIN D3) 1.25 MG (50000 UT) CAPS Take 50,000 Units by mouth every Thursday.     clozapine (CLOZARIL) 50  MG tablet Take 50 mg by mouth 2 (two) times daily.     divalproex (DEPAKOTE ER) 500 MG 24 hr tablet Take 500 mg by mouth in the morning and at bedtime.     haloperidol (HALDOL) 10 MG tablet Take 10 mg by mouth 3 (three) times daily.     INGREZZA 40 MG capsule Take 40 mg by mouth in the morning.     latanoprost (XALATAN) 0.005 % ophthalmic solution Place 1 drop into both eyes at bedtime.     lisinopril (ZESTRIL) 10 MG tablet Take 10 mg by mouth daily.     metFORMIN (GLUCOPHAGE) 500 MG tablet Take 500 mg by mouth 2 (two) times daily with a meal.     nicotine (NICODERM CQ - DOSED IN MG/24 HR) 7 mg/24hr patch Place 7 mg onto the skin daily.     OLANZapine-Samidorphan (LYBALVI) 20-10 MG TABS Take 1 tablet by mouth at bedtime.     PROAIR HFA 108 (90 Base) MCG/ACT inhaler Inhale 2 puffs into the lungs every 6 (six) hours as needed for wheezing or shortness of breath.     zolpidem (AMBIEN) 10 MG tablet Take 10 mg by mouth at bedtime.      Labs  Lab Results:  Admission on 10/04/2021  Component Date Value Ref Range Status   SARS Coronavirus 2 by RT PCR 10/04/2021 NEGATIVE  NEGATIVE Final   Comment: (NOTE) SARS-CoV-2 target nucleic acids are NOT DETECTED.  The SARS-CoV-2 RNA is generally detectable in upper respiratory specimens during the acute phase of infection. The lowest concentration of SARS-CoV-2 viral copies this assay can detect is 138 copies/mL. A negative result does not preclude SARS-Cov-2 infection and should not be used as the sole basis for treatment or other patient management  decisions. A negative result may occur with  improper specimen collection/handling, submission of specimen other than nasopharyngeal swab, presence of viral mutation(s) within the areas targeted by this assay, and inadequate number of viral copies(<138 copies/mL). A negative result must be combined with clinical observations, patient history, and epidemiological information. The expected result is Negative.  Fact Sheet for Patients:  EntrepreneurPulse.com.au  Fact Sheet for Healthcare Providers:  IncredibleEmployment.be  This test is no                          t yet approved or cleared by the [place] FDA and  has been authorized for detection and/or diagnosis of SARS-CoV-2 by FDA under an Emergency Use Authorization (EUA). This EUA will remain  in effect (meaning this test can be used) for the duration of the COVID-19 declaration under Section 564(b)(1) of the Act, 21 U.S.C.section 360bbb-3(b)(1), unless the authorization is terminated  or revoked sooner.       Influenza A by PCR 10/04/2021 NEGATIVE  NEGATIVE Final   Influenza B by PCR 10/04/2021 NEGATIVE  NEGATIVE Final   Comment: (NOTE) The Xpert Xpress SARS-CoV-2/FLU/RSV plus assay is intended as an aid in the diagnosis of influenza from Nasopharyngeal swab specimens and should not be used as a sole basis for treatment. Nasal washings and aspirates are unacceptable for Xpert Xpress SARS-CoV-2/FLU/RSV testing.  Fact Sheet for Patients: EntrepreneurPulse.com.au  Fact Sheet for Healthcare Providers: IncredibleEmployment.be  This test is not yet approved or cleared by the [place] FDA and has been authorized for detection and/or diagnosis of SARS-CoV-2 by FDA under an Emergency Use Authorization (EUA). This EUA will remain in effect (meaning this test can be used)  for the duration of the COVID-19 declaration under Section 564(b)(1) of the Act,  21 U.S.C. section 360bbb-3(b)(1), unless the authorization is terminated or revoked.  Performed at [facility] Lab, [address], [place], [place] [address]    WBC 10/04/2021 8.4  4.0 - 10.5 K/uL Final   RBC 10/04/2021 4.42  3.87 - 5.11 MIL/uL Final   Hemoglobin 10/04/2021 11.6 (L)  12.0 - 15.0 g/dL Final   HCT 60/45/4098 35.7 (L)  36.0 - 46.0 % Final   MCV 10/04/2021 80.8  80.0 - 100.0 fL Final   MCH 10/04/2021 26.2  26.0 - 34.0 pg Final   MCHC 10/04/2021 32.5  30.0 - 36.0 g/dL Final   RDW 11/91/4782 16.0 (H)  11.5 - 15.5 % Final   Platelets 10/04/2021 372  150 - 400 K/uL Final   nRBC 10/04/2021 0.0  0.0 - 0.2 % Final   Neutrophils Relative % 10/04/2021 68  % Final   Neutro Abs 10/04/2021 5.7  1.7 - 7.7 K/uL Final   Lymphocytes Relative 10/04/2021 22  % Final   Lymphs Abs 10/04/2021 1.8  0.7 - 4.0 K/uL Final   Monocytes Relative 10/04/2021 8  % Final   Monocytes Absolute 10/04/2021 0.7  0.1 - 1.0 K/uL Final   Eosinophils Relative 10/04/2021 1  % Final   Eosinophils Absolute 10/04/2021 0.1  0.0 - 0.5 K/uL Final   Basophils Relative 10/04/2021 1  % Final   Basophils Absolute 10/04/2021 0.1  0.0 - 0.1 K/uL Final   Immature Granulocytes 10/04/2021 0  % Final   Abs Immature Granulocytes 10/04/2021 0.03  0.00 - 0.07 K/uL Final   Performed at [facility], [address], [place], [place] [address]   Sodium 10/04/2021 136  135 - 145 mmol/L Final   Potassium 10/04/2021 4.2  3.5 - 5.1 mmol/L Final   Chloride 10/04/2021 104  98 - 111 mmol/L Final   CO2 10/04/2021 25  22 - 32 mmol/L Final   Glucose, Bld 10/04/2021 100 (H)  70 - 99 mg/dL Final   Glucose reference range applies only to samples taken after fasting for at least 8 hours.   BUN 10/04/2021 9  6 - 20 mg/dL Final   Creatinine, Ser 10/04/2021 0.52  0.44 - 1.00 mg/dL Final   Calcium 30/86/5784 9.0  8.9 - 10.3 mg/dL Final   Total Protein 69/62/9528 7.0  6.5 - 8.1 g/dL Final   Albumin 41/32/4401 3.2 (L)  3.5 - 5.0 g/dL  Final   AST 02/72/5366 13 (L)  15 - 41 U/L Final   ALT 10/04/2021 10  0 - 44 U/L Final   Alkaline Phosphatase 10/04/2021 61  38 - 126 U/L Final   Total Bilirubin 10/04/2021 0.3  0.3 - 1.2 mg/dL Final   GFR, Estimated 10/04/2021 >60  >60 mL/min Final   Comment: (NOTE) Calculated using the CKD-EPI Creatinine Equation (2021)    Anion gap 10/04/2021 7  5 - 15 Final   Performed at [facility], [address] [place], [place] [address]   Hgb A1c MFr Bld 10/04/2021 7.0 (H)  4.8 - 5.6 % Final   Comment: (NOTE) Pre diabetes:          5.7%-6.4%  Diabetes:              >6.4%  Glycemic control for   <7.0% adults with diabetes    Mean Plasma Glucose 10/04/2021 154.2  mg/dL Final   Performed at [facility],  [address], [place], [place] [address]   Cholesterol 10/04/2021 178  0 - 200 mg/dL Final   Triglycerides 57/26/2035 155 (H)  <150 mg/dL Final   HDL 59/74/1638 52  >40 mg/dL Final   Total CHOL/HDL Ratio 10/04/2021 3.4  RATIO Final   VLDL 10/04/2021 31  0 - 40 mg/dL Final   LDL Cholesterol 10/04/2021 95  0 - 99 mg/dL Final   Comment:        Total Cholesterol/HDL:CHD Risk Coronary Heart Disease Risk Table                     Men   Women  1/2 Average Risk   3.4   3.3  Average Risk       5.0   4.4  2 X Average Risk   9.6   7.1  3 X Average Risk  23.4   11.0        Use the calculated Patient Ratio above and the CHD Risk Table to determine the patient's CHD Risk.        ATP III CLASSIFICATION (LDL):  <100     mg/dL   Optimal  453-646  mg/dL   Near or Above                    Optimal  130-159  mg/dL   Borderline  803-212  mg/dL   High  >248     mg/dL   Very High Performed at [facility], [address] [place], [place] [address]    POC Amphetamine UR 10/04/2021 None Detected  NONE DETECTED (Cut Off Level 1000 ng/mL) Final   POC Secobarbital (BAR) 10/04/2021 None Detected  NONE DETECTED (Cut Off Level 300 ng/mL) Final   POC Buprenorphine (BUP) 10/04/2021  None Detected  NONE DETECTED (Cut Off Level 10 ng/mL) Final   POC Oxazepam (BZO) 10/04/2021 None Detected  NONE DETECTED (Cut Off Level 300 ng/mL) Final   POC Cocaine UR 10/04/2021 None Detected  NONE DETECTED (Cut Off Level 300 ng/mL) Final   POC Methamphetamine UR 10/04/2021 None Detected  NONE DETECTED (Cut Off Level 1000 ng/mL) Final   POC Morphine 10/04/2021 None Detected  NONE DETECTED (Cut Off Level 300 ng/mL) Final   POC Methadone UR 10/04/2021 None Detected  NONE DETECTED (Cut Off Level 300 ng/mL) Final   POC Oxycodone UR 10/04/2021 Positive (A)  NONE DETECTED (Cut Off Level 100 ng/mL) Final   POC Marijuana UR 10/04/2021 None Detected  NONE DETECTED (Cut Off Level 50 ng/mL) Final   SARSCOV2ONAVIRUS 2 AG 10/04/2021 NEGATIVE  NEGATIVE Final   Comment: (NOTE) SARS-CoV-2 antigen NOT DETECTED.   Negative results are presumptive.  Negative results do not preclude SARS-CoV-2 infection and should not be used as the sole basis for treatment or other patient management decisions, including infection  control decisions, particularly in the presence of clinical signs and  symptoms consistent with COVID-19, or in those who have been in contact with the virus.  Negative results must be combined with clinical observations, patient history, and epidemiological information. The expected result is Negative.  Fact Sheet for Patients: https://www.jennings-kim.com/  Fact Sheet for Healthcare Providers: https://alexander-rogers.biz/  This test is not yet approved or cleared by the [place] FDA and  has been authorized for detection and/or diagnosis of SARS-CoV-2 by FDA under an Emergency Use Authorization (EUA).  This EUA will remain in effect (meaning this test can be used) for the duration of  the  COV                          ID-19 declaration under Section 564(b)(1) of the Act, 21 U.S.C. section 360bbb-3(b)(1), unless the authorization is terminated or revoked  sooner.     Valproic Acid Lvl 10/04/2021 51  50.0 - 100.0 ug/mL Final   Performed at [facility], [address], [place], [place] [address]  Admission on 09/09/2021, Discharged on 09/10/2021  Component Date Value Ref Range Status   Sodium 09/09/2021 134 (L)  135 - 145 mmol/L Final   Potassium 09/09/2021 4.3  3.5 - 5.1 mmol/L Final   Chloride 09/09/2021 99  98 - 111 mmol/L Final   CO2 09/09/2021 25  22 - 32 mmol/L Final   Glucose, Bld 09/09/2021 107 (H)  70 - 99 mg/dL Final   Glucose reference range applies only to samples taken after fasting for at least 8 hours.   BUN 09/09/2021 12  6 - 20 mg/dL Final   Creatinine, Ser 09/09/2021 0.74  0.44 - 1.00 mg/dL Final   Calcium 24/40/1027 9.0  8.9 - 10.3 mg/dL Final   Total Protein 25/36/6440 7.4  6.5 - 8.1 g/dL Final   Albumin 34/74/2595 3.1 (L)  3.5 - 5.0 g/dL Final   AST 63/87/5643 13 (L)  15 - 41 U/L Final   ALT 09/09/2021 13  0 - 44 U/L Final   Alkaline Phosphatase 09/09/2021 66  38 - 126 U/L Final   Total Bilirubin 09/09/2021 0.2 (L)  0.3 - 1.2 mg/dL Final   GFR, Estimated 09/09/2021 >60  >60 mL/min Final   Comment: (NOTE) Calculated using the CKD-EPI Creatinine Equation (2021)    Anion gap 09/09/2021 10  5 - 15 Final   Performed at [facility], [address] [place], [place] [address]   WBC 09/09/2021 8.5  4.0 - 10.5 K/uL Final   RBC 09/09/2021 4.53  3.87 - 5.11 MIL/uL Final   Hemoglobin 09/09/2021 11.8 (L)  12.0 - 15.0 g/dL Final   HCT 88/41/6606 38.0  36.0 - 46.0 % Final   MCV 09/09/2021 83.9  80.0 - 100.0 fL Final   MCH 09/09/2021 26.0  26.0 - 34.0 pg Final   MCHC 09/09/2021 31.1  30.0 - 36.0 g/dL Final   RDW 30/16/0109 17.8 (H)  11.5 - 15.5 % Final   Platelets 09/09/2021 442 (H)  150 - 400 K/uL Final   nRBC 09/09/2021 0.0  0.0 - 0.2 % Final   Neutrophils Relative % 09/09/2021 70  % Final   Neutro Abs 09/09/2021 5.9  1.7 - 7.7 K/uL Final   Lymphocytes Relative 09/09/2021 20  % Final   Lymphs Abs 09/09/2021  1.7  0.7 - 4.0 K/uL Final   Monocytes Relative 09/09/2021 8  % Final   Monocytes Absolute 09/09/2021 0.7  0.1 - 1.0 K/uL Final   Eosinophils Relative 09/09/2021 1  % Final   Eosinophils Absolute 09/09/2021 0.1  0.0 - 0.5 K/uL Final   Basophils Relative 09/09/2021 1  % Final   Basophils Absolute 09/09/2021 0.1  0.0 - 0.1 K/uL Final   Immature Granulocytes 09/09/2021 0  % Final   Abs Immature Granulocytes 09/09/2021 0.03  0.00 - 0.07 K/uL Final   Performed at [facility], [address] [place], [place] [address]   Ammonia 09/09/2021 37 (H)  9 - 35 umol/L Final   Comment: HEMOLYSIS AT THIS LEVEL MAY AFFECT RESULT Performed  at [facility], [address] [place], [place] [address]    Opiates 09/09/2021 NONE DETECTED  NONE DETECTED Final   Cocaine 09/09/2021 NONE DETECTED  NONE DETECTED Final   Benzodiazepines 09/09/2021 NONE DETECTED  NONE DETECTED Final   Amphetamines 09/09/2021 NONE DETECTED  NONE DETECTED Final   Tetrahydrocannabinol 09/09/2021 NONE DETECTED  NONE DETECTED Final   Barbiturates 09/09/2021 NONE DETECTED  NONE DETECTED Final   Comment: (NOTE) DRUG SCREEN FOR MEDICAL PURPOSES ONLY.  IF CONFIRMATION IS NEEDED FOR ANY PURPOSE, NOTIFY LAB WITHIN 5 DAYS.  LOWEST DETECTABLE LIMITS FOR URINE DRUG SCREEN Drug Class                     Cutoff (ng/mL) Amphetamine and metabolites    1000 Barbiturate and metabolites    200 Benzodiazepine                 200 Tricyclics and metabolites     300 Opiates and metabolites        300 Cocaine and metabolites        300 THC                            50 Performed at [facility], [address] [place], [place] [address]    Alcohol, Ethyl (B) 09/09/2021 <10  <10 mg/dL Final   Comment: (NOTE) Lowest detectable limit for serum alcohol is 10 mg/dL.  For medical purposes only. Performed at [facility], [address] [place], [place] [address]    Lipase 09/09/2021 33  11 - 51 U/L Final   Performed at  [facility], [address] [place], [place] [address]   Color, Urine 09/09/2021 COLORLESS (A)  YELLOW Final   APPearance 09/09/2021 CLEAR  CLEAR Final   Specific Gravity, Urine 09/09/2021 1.002 (L)  1.005 - 1.030 Final   pH 09/09/2021 6.0  5.0 - 8.0 Final   Glucose, UA 09/09/2021 NEGATIVE  NEGATIVE mg/dL Final   Hgb urine dipstick 09/09/2021 NEGATIVE  NEGATIVE Final   Bilirubin Urine 09/09/2021 NEGATIVE  NEGATIVE Final   Ketones, ur 09/09/2021 NEGATIVE  NEGATIVE mg/dL Final   Protein, ur 29/56/213007/01/2022 NEGATIVE  NEGATIVE mg/dL Final   Nitrite 86/57/846907/01/2022 NEGATIVE  NEGATIVE Final   Leukocytes,Ua 09/09/2021 NEGATIVE  NEGATIVE Final   Performed at [facility], [address] [place], [place] [address]   Specimen Description 09/09/2021 URINE, CLEAN CATCH   Final   Special Requests 09/09/2021 NONE   Final   Culture 09/09/2021  (A)   Final                   Value:<10,000 COLONIES/mL INSIGNIFICANT GROWTH Performed at [facility], [address] [place], [place] [address]    Report Status 09/09/2021 09/10/2021 FINAL   Final   D-Dimer, Quant 09/09/2021 0.92 (H)  0.00 - 0.50 ug/mL-FEU Final   Comment: (NOTE) At the manufacturer cut-off value of 0.5 g/mL FEU, this assay has a negative predictive value of 95-100%.This assay is intended for use in conjunction with a clinical pretest probability (PTP) assessment model to exclude pulmonary embolism (PE) and deep venous thrombosis (DVT) in outpatients suspected of PE or DVT. Results should be correlated with clinical presentation. Performed at [facility], [address] [place], [place] [address]    Troponin I (High Sensitivity) 09/09/2021 5  <18 ng/L Final  Comment: (NOTE) Elevated high sensitivity troponin I (hsTnI) values and significant  changes across serial measurements may suggest ACS but many other  chronic and acute conditions are known to elevate hsTnI results.  Refer to the "Links" section for chest pain  algorithms and additional  guidance. Performed at [facility], [address], [place],  [address]    BP 09/10/2021 135/83  mmHg Final   S' Lateral 09/10/2021 2.30  cm Final   Area-P 1/2 09/10/2021 5.58  cm2 Final    Blood Alcohol level:  Lab Results  Component Value Date   ETH <10 09/09/2021   ETH <10 [id number]    Metabolic Disorder Labs: Lab Results  Component Value Date   HGBA1C 7.0 (H) 10/04/2021   MPG 154.2 10/04/2021   No results found for: "PROLACTIN" Lab Results  Component Value Date   CHOL 178 10/04/2021   TRIG 155 (H) 10/04/2021   HDL 52 10/04/2021   CHOLHDL 3.4 10/04/2021   VLDL 31 10/04/2021   [place] 95 10/04/2021    Therapeutic Lab Levels: No results found for: "LITHIUM" Lab Results  Component Value Date   VALPROATE 51 10/04/2021   VALPROATE 22 (L) 10/21/2020   No results found for: "CBMZ"  Physical Findings   Flowsheet Row ED from 10/04/2021 in [facility] ED from 09/09/2021 in [place] ED from 08/22/2021 in [facility]  [place] No Risk No Risk No Risk        Musculoskeletal  Strength & Muscle Tone: within normal limits Gait & Station: normal Patient leans: N/A  Psychiatric Specialty Exam  Presentation  General Appearance: Disheveled  Eye Contact:Good  Speech:Clear and Coherent  Speech Volume:Normal  Handedness:Right   Mood and Affect  Mood:Euthymic  Affect:Constricted   Thought Process  Thought Processes:Coherent; Linear  Descriptions of Associations:Intact  Orientation:Full (Time, Place and Person)  Thought Content:Logical  Diagnosis of Schizophrenia or Schizoaffective disorder in past: Yes  Duration of Psychotic Symptoms: Greater than six months   Hallucinations:Hallucinations: None  Ideas of Reference:None  Suicidal Thoughts:Suicidal Thoughts: No  Homicidal Thoughts:Homicidal Thoughts:  No   Sensorium  Memory:Immediate Fair; Recent Fair; Remote Fair  Judgment:Poor  Insight:Poor   Executive Functions  Concentration:Poor  Attention Span:Poor  Recall:Poor  Fund of Knowledge:Poor  Language:Poor   Psychomotor Activity  Psychomotor Activity:Psychomotor Activity: Normal   Assets  Assets:Communication Skills; Desire for Improvement   Sleep  Sleep:Sleep: Fair   Physical Exam  Physical Exam Constitutional:      Appearance: the patient is not toxic-appearing.  Pulmonary:     Effort: Pulmonary effort is normal.  Neurological:     General: No focal deficit present.     Mental Status: the patient is alert and oriented to person, place, and time.   Review of Systems  Respiratory:  Negative for shortness of breath.   Cardiovascular:  Negative for chest pain.  Gastrointestinal:  Negative for abdominal pain, constipation, diarrhea, nausea and vomiting.  Neurological:  Negative for headaches.   Blood pressure 117/64, pulse (!) 101, temperature 98.3 F (36.8 C), temperature source Oral, resp. rate 18, SpO2 96 %. There is no height or weight on file to calculate BMI.  Treatment Plan Summary: Daily contact with patient to assess and evaluate symptoms and progress in treatment and Medication management  Status: Voluntary Disposition: TBD, difficult to place given ID and schizophrenia with aggression -LCSW working on disposition planning  Hx of schizophrenia and aggression  Continue home medications as listed - Discontinued Haldol 10 mg 3 times daily because this is not currently being prescribed by her outpatient provider -- Continue Zyprexa 20 mg QHS (in place of Lybalvi) - Continue Cogentin 1 mg twice daily - Continue Clozapine 50 mg twice daily, ACTT confirms patient is prescribed this medicine - Ingrezza 40 mg daily - Depakote extended release 500 mg twice daily  VPA level due 8/10 AM   Labs: VPA trough of 51 8/6 UDS with oxy A1C of 7 Hgb of  11.6 MCV of 80, will need outpatient follow up   Carlyn Reichert, MD 10/07/2021 11:07 AM

## 2021-10-07 NOTE — ED Notes (Signed)
Received patient this PM. Patient in her bed watching TV. Patient respirations are even and unlabored. Will continue to monitor for safety.

## 2021-10-07 NOTE — ED Notes (Signed)
Pt accepted scheduled morning meds w/o difficulty. Denied SI/HI/AVH. Currently resting in pull out bed/chair. Safety maintained and will continue to monitor.

## 2021-10-07 NOTE — ED Notes (Signed)
Pt resting in bed. Respirations even and unlabored. Monitoring for safety. 

## 2021-10-08 LAB — VALPROIC ACID LEVEL: Valproic Acid Lvl: 57 ug/mL (ref 50.0–100.0)

## 2021-10-08 NOTE — ED Provider Notes (Signed)
Behavioral Health Progress Note  Date and Time: 10/08/2021 12:50 PM Name: Teresa Murillo MRN:  [id number]  Subjective:   Teresa Murillo is a 61 year old female with a past psychiatric history of schizophrenia, aggressive behavior, and possible intellectual disability.  She presented to the [facility] behavioral health urgent care after an argument with staff members at her group home that resulted in her signing discharge paperwork and leaving.  Per previous documentation, the group home staff members have reported that the patient has a history of intellectual disability.  Cannot find this documented elsewhere in the chart.  It is certainly possible, as the patient is unable to state any of her home medications and is unable to perform simple math problems.  The patient reports that she receives a disability check each month. The patient is connected with [facility] but does not receive ACT services.   On assessment this morning, the patient is calm and cooperative.  Per nursing documentation the patient has also been calm and cooperative.  Presently she does not appear to be psychotic and she does not admit to any auditory or visual hallucinations.  She denies suicidal or homicidal thoughts.  She reports appropriate mood, sleep, and appetite.   LCSW is currently working on disposition planning with patient's Pension scheme manager, Ms. Neva Seat. Ms. Neva Seat is to discuss patient's case with the group home staff and see if the patient can be accepted back with extra community support.     Diagnosis:  Final diagnoses:  Schizophrenia, unspecified type (HCC)    Total Time spent with patient: 15 minutes  Past Psychiatric History: as above Past Medical History:  Past Medical History:  Diagnosis Date   Chronic obstructive pulmonary disease (COPD) (HCC)    Glaucoma    Hyperlipidemia    Hypertension    Iron deficiency    Schizoaffective disorder (HCC)    Type 2 diabetes mellitus (HCC)      Past Surgical History:  Procedure Laterality Date   TUBAL LIGATION     Family History:  Family History  Problem Relation Age of Onset   Breast cancer Maternal Grandmother    Family Psychiatric  History: as per H and P Social History:  Social History   Substance and Sexual Activity  Alcohol Use Yes     Social History   Substance and Sexual Activity  Drug Use Not Currently    Social History   Socioeconomic History   Marital status: Divorced    Spouse name: Not on file   Number of children: Not on file   Years of education: Not on file   Highest education level: Not on file  Occupational History   Not on file  Tobacco Use   Smoking status: Every Day    Packs/day: 1.00    Types: Cigars, Cigarettes   Smokeless tobacco: Current  Vaping Use   Vaping Use: Never used  Substance and Sexual Activity   Alcohol use: Yes   Drug use: Not Currently   Sexual activity: Not Currently    Birth control/protection: Surgical  Other Topics Concern   Not on file  Social History Narrative   Not on file   Social Determinants of Health   Financial Resource Strain: Not on file  Food Insecurity: Not on file  Transportation Needs: Not on file  Physical Activity: Not on file  Stress: Not on file  Social Connections: Not on file   SDOH:  SDOH Screenings   Alcohol Screen: Not on file  Depression (  [id number]): Not on file  Financial Resource Strain: Not on file  Food Insecurity: Not on file  Housing: Not on file  Physical Activity: Not on file  Social Connections: Not on file  Stress: Not on file  Tobacco Use: High Risk (09/09/2021)   Patient History    Smoking Tobacco Use: Every Day    Smokeless Tobacco Use: Current    Passive Exposure: Not on file  Transportation Needs: Not on file   Additional Social History:    Pain Medications: See MAR Prescriptions: See MAR Over the Counter: See MAR History of alcohol / drug use?: No history of alcohol / drug abuse Longest period of  sobriety (when/how long): N/A Negative Consequences of Use:  (N/A) Withdrawal Symptoms:  (N/A)                    Sleep: Fair  Appetite:  Fair  Current Medications:  Current Facility-Administered Medications  Medication Dose Route Frequency Provider Last Rate Last Admin   acetaminophen (TYLENOL) tablet 650 mg  650 mg Oral Q6H PRN Lauree ChandlerLee, Jacqueline Eun, NP       alum & mag hydroxide-simeth (MAALOX/MYLANTA) 200-200-20 MG/5ML suspension 30 mL  30 mL Oral Q4H PRN Lauree ChandlerLee, Jacqueline Eun, NP       benztropine (COGENTIN) tablet 1 mg  1 mg Oral BID Lauree ChandlerLee, Jacqueline Eun, NP   1 mg at 10/08/21 0955   cloZAPine (CLOZARIL) tablet 50 mg  50 mg Oral BID Lauree ChandlerLee, Jacqueline Eun, NP   50 mg at 10/08/21 0956   divalproex (DEPAKOTE ER) 24 hr tablet 500 mg  500 mg Oral BID Lauree ChandlerLee, Jacqueline Eun, NP   500 mg at 10/08/21 0955   latanoprost (XALATAN) 0.005 % ophthalmic solution 1 drop  1 drop Both Eyes QHS Lauree ChandlerLee, Jacqueline Eun, NP   1 drop at 10/07/21 2035   lisinopril (ZESTRIL) tablet 10 mg  10 mg Oral Daily Lauree ChandlerLee, Jacqueline Eun, NP   10 mg at 10/08/21 0955   magnesium hydroxide (MILK OF MAGNESIA) suspension 30 mL  30 mL Oral Daily PRN Lauree ChandlerLee, Jacqueline Eun, NP       metFORMIN (GLUCOPHAGE) tablet 500 mg  500 mg Oral BID WC Lauree ChandlerLee, Jacqueline Eun, NP   500 mg at 10/08/21 [id number]   mometasone-formoterol (DULERA) 200-5 MCG/ACT inhaler 2 puff  2 puff Inhalation BID Lauree ChandlerLee, Jacqueline Eun, NP   2 puff at 10/08/21 0829   nicotine (NICODERM CQ - dosed in mg/24 hr) patch 7 mg  7 mg Transdermal Daily Lauree ChandlerLee, Jacqueline Eun, NP   7 mg at 10/08/21 0955   OLANZapine (ZYPREXA) tablet 20 mg  20 mg Oral QHS Carlyn ReichertGabrielle, Iretta Mangrum, MD   20 mg at 10/07/21 2029   valbenazine (INGREZZA) capsule 40 mg  40 mg Oral q AM Lauree ChandlerLee, Jacqueline Eun, NP   40 mg at 10/08/21 [id number]   Current Outpatient Medications  Medication Sig Dispense Refill   benztropine (COGENTIN) 1 MG tablet Take 1 mg by mouth in the morning and at bedtime.     budesonide-formoterol (SYMBICORT)  160-4.5 MCG/ACT inhaler Inhale 2 puffs into the lungs in the morning and at bedtime.     Cholecalciferol (VITAMIN D3) 1.25 MG (50000 UT) CAPS Take 50,000 Units by mouth every Thursday.     clozapine (CLOZARIL) 50 MG tablet Take 50 mg by mouth 2 (two) times daily.     divalproex (DEPAKOTE ER) 500 MG 24 hr tablet Take 500 mg by mouth in the morning and at bedtime.  haloperidol (HALDOL) 10 MG tablet Take 10 mg by mouth 3 (three) times daily.     INGREZZA 40 MG capsule Take 40 mg by mouth in the morning.     latanoprost (XALATAN) 0.005 % ophthalmic solution Place 1 drop into both eyes at bedtime.     lisinopril (ZESTRIL) 10 MG tablet Take 10 mg by mouth daily.     metFORMIN (GLUCOPHAGE) 500 MG tablet Take 500 mg by mouth 2 (two) times daily with a meal.     nicotine (NICODERM CQ - DOSED IN MG/24 HR) 7 mg/24hr patch Place 7 mg onto the skin daily.     OLANZapine-Samidorphan (LYBALVI) 20-10 MG TABS Take 1 tablet by mouth at bedtime.     PROAIR HFA 108 (90 Base) MCG/ACT inhaler Inhale 2 puffs into the lungs every 6 (six) hours as needed for wheezing or shortness of breath.     zolpidem (AMBIEN) 10 MG tablet Take 10 mg by mouth at bedtime.      Labs  Lab Results:  Admission on 10/04/2021  Component Date Value Ref Range Status   SARS Coronavirus 2 by RT PCR 10/04/2021 NEGATIVE  NEGATIVE Final   Comment: (NOTE) SARS-CoV-2 target nucleic acids are NOT DETECTED.  The SARS-CoV-2 RNA is generally detectable in upper respiratory specimens during the acute phase of infection. The lowest concentration of SARS-CoV-2 viral copies this assay can detect is 138 copies/mL. A negative result does not preclude SARS-Cov-2 infection and should not be used as the sole basis for treatment or other patient management decisions. A negative result may occur with  improper specimen collection/handling, submission of specimen other than nasopharyngeal swab, presence of viral mutation(s) within the areas targeted by  this assay, and inadequate number of viral copies(<138 copies/mL). A negative result must be combined with clinical observations, patient history, and epidemiological information. The expected result is Negative.  Fact Sheet for Patients:  BloggerCourse.com  Fact Sheet for Healthcare Providers:  SeriousBroker.it  This test is no                          t yet approved or cleared by the [place] FDA and  has been authorized for detection and/or diagnosis of SARS-CoV-2 by FDA under an Emergency Use Authorization (EUA). This EUA will remain  in effect (meaning this test can be used) for the duration of the COVID-19 declaration under Section 564(b)(1) of the Act, 21 U.S.C.section 360bbb-3(b)(1), unless the authorization is terminated  or revoked sooner.       Influenza A by PCR 10/04/2021 NEGATIVE  NEGATIVE Final   Influenza B by PCR 10/04/2021 NEGATIVE  NEGATIVE Final   Comment: (NOTE) The Xpert Xpress SARS-CoV-2/FLU/RSV plus assay is intended as an aid in the diagnosis of influenza from Nasopharyngeal swab specimens and should not be used as a sole basis for treatment. Nasal washings and aspirates are unacceptable for Xpert Xpress SARS-CoV-2/FLU/RSV testing.  Fact Sheet for Patients: BloggerCourse.com  Fact Sheet for Healthcare Providers: SeriousBroker.it  This test is not yet approved or cleared by the [place] FDA and has been authorized for detection and/or diagnosis of SARS-CoV-2 by FDA under an Emergency Use Authorization (EUA). This EUA will remain in effect (meaning this test can be used) for the duration of the COVID-19 declaration under Section 564(b)(1) of the Act, 21 U.S.C. section 360bbb-3(b)(1), unless the authorization is terminated or revoked.  Performed at [facility], [address] [place], [place] [address]  WBC 10/04/2021 8.4  4.0 -  10.5 K/uL Final   RBC 10/04/2021 4.42  3.87 - 5.11 MIL/uL Final   Hemoglobin 10/04/2021 11.6 (L)  12.0 - 15.0 g/dL Final   HCT 99/24/2683 35.7 (L)  36.0 - 46.0 % Final   MCV 10/04/2021 80.8  80.0 - 100.0 fL Final   MCH 10/04/2021 26.2  26.0 - 34.0 pg Final   MCHC 10/04/2021 32.5  30.0 - 36.0 g/dL Final   RDW 41/96/2229 16.0 (H)  11.5 - 15.5 % Final   Platelets 10/04/2021 372  150 - 400 K/uL Final   nRBC 10/04/2021 0.0  0.0 - 0.2 % Final   Neutrophils Relative % 10/04/2021 68  % Final   Neutro Abs 10/04/2021 5.7  1.7 - 7.7 K/uL Final   Lymphocytes Relative 10/04/2021 22  % Final   Lymphs Abs 10/04/2021 1.8  0.7 - 4.0 K/uL Final   Monocytes Relative 10/04/2021 8  % Final   Monocytes Absolute 10/04/2021 0.7  0.1 - 1.0 K/uL Final   Eosinophils Relative 10/04/2021 1  % Final   Eosinophils Absolute 10/04/2021 0.1  0.0 - 0.5 K/uL Final   Basophils Relative 10/04/2021 1  % Final   Basophils Absolute 10/04/2021 0.1  0.0 - 0.1 K/uL Final   Immature Granulocytes 10/04/2021 0  % Final   Abs Immature Granulocytes 10/04/2021 0.03  0.00 - 0.07 K/uL Final   Performed at [facility], [address] [place], [place] [address]   Sodium 10/04/2021 136  135 - 145 mmol/L Final   Potassium 10/04/2021 4.2  3.5 - 5.1 mmol/L Final   Chloride 10/04/2021 104  98 - 111 mmol/L Final   CO2 10/04/2021 25  22 - 32 mmol/L Final   Glucose, Bld 10/04/2021 100 (H)  70 - 99 mg/dL Final   Glucose reference range applies only to samples taken after fasting for at least 8 hours.   BUN 10/04/2021 9  6 - 20 mg/dL Final   Creatinine, Ser 10/04/2021 0.52  0.44 - 1.00 mg/dL Final   Calcium 11/94/1740 9.0  8.9 - 10.3 mg/dL Final   Total Protein 81/44/8185 7.0  6.5 - 8.1 g/dL Final   Albumin 63/14/9702 3.2 (L)  3.5 - 5.0 g/dL Final   AST 63/78/5885 13 (L)  15 - 41 U/L Final   ALT 10/04/2021 10  0 - 44 U/L Final   Alkaline Phosphatase 10/04/2021 61  38 - 126 U/L Final   Total Bilirubin 10/04/2021 0.3  0.3 - 1.2 mg/dL  Final   GFR, Estimated 10/04/2021 >60  >60 mL/min Final   Comment: (NOTE) Calculated using the CKD-EPI Creatinine Equation (2021)    Anion gap 10/04/2021 7  5 - 15 Final   Performed at [facility], [address] [place], [place] [address]   Hgb A1c MFr Bld 10/04/2021 7.0 (H)  4.8 - 5.6 % Final   Comment: (NOTE) Pre diabetes:          5.7%-6.4%  Diabetes:              >6.4%  Glycemic control for   <7.0% adults with diabetes    Mean Plasma Glucose 10/04/2021 154.2  mg/dL Final   Performed at [facility], [address] [place], [place] [address]   Cholesterol 10/04/2021 178  0 - 200 mg/dL Final   Triglycerides 67/67/2094 155 (H)  <150 mg/dL Final   HDL 70/96/2836 52  >40 mg/dL Final   Total CHOL/HDL  Ratio 10/04/2021 3.4  RATIO Final   VLDL 10/04/2021 31  0 - 40 mg/dL Final   LDL Cholesterol 10/04/2021 95  0 - 99 mg/dL Final   Comment:        Total Cholesterol/HDL:CHD Risk Coronary Heart Disease Risk Table                     Men   Women  1/2 Average Risk   3.4   3.3  Average Risk       5.0   4.4  2 X Average Risk   9.6   7.1  3 X Average Risk  23.4   11.0        Use the calculated Patient Ratio above and the CHD Risk Table to determine the patient's CHD Risk.        ATP III CLASSIFICATION (LDL):  <100     mg/dL   Optimal  093-235  mg/dL   Near or Above                    Optimal  130-159  mg/dL   Borderline  573-220  mg/dL   High  >254     mg/dL   Very High Performed at [facility], [address] [place], [place] [address]    POC Amphetamine UR 10/04/2021 None Detected  NONE DETECTED (Cut Off Level 1000 ng/mL) Final   POC Secobarbital (BAR) 10/04/2021 None Detected  NONE DETECTED (Cut Off Level 300 ng/mL) Final   POC Buprenorphine (BUP) 10/04/2021 None Detected  NONE DETECTED (Cut Off Level 10 ng/mL) Final   POC Oxazepam (BZO) 10/04/2021 None Detected  NONE DETECTED (Cut Off Level 300 ng/mL) Final   POC Cocaine UR 10/04/2021 None Detected   NONE DETECTED (Cut Off Level 300 ng/mL) Final   POC Methamphetamine UR 10/04/2021 None Detected  NONE DETECTED (Cut Off Level 1000 ng/mL) Final   POC Morphine 10/04/2021 None Detected  NONE DETECTED (Cut Off Level 300 ng/mL) Final   POC Methadone UR 10/04/2021 None Detected  NONE DETECTED (Cut Off Level 300 ng/mL) Final   POC Oxycodone UR 10/04/2021 Positive (A)  NONE DETECTED (Cut Off Level 100 ng/mL) Final   POC Marijuana UR 10/04/2021 None Detected  NONE DETECTED (Cut Off Level 50 ng/mL) Final   SARSCOV2ONAVIRUS 2 AG 10/04/2021 NEGATIVE  NEGATIVE Final   Comment: (NOTE) SARS-CoV-2 antigen NOT DETECTED.   Negative results are presumptive.  Negative results do not preclude SARS-CoV-2 infection and should not be used as the sole basis for treatment or other patient management decisions, including infection  control decisions, particularly in the presence of clinical signs and  symptoms consistent with COVID-19, or in those who have been in contact with the virus.  Negative results must be combined with clinical observations, patient history, and epidemiological information. The expected result is Negative.  Fact Sheet for Patients: https://www.jennings-kim.com/  Fact Sheet for Healthcare Providers: https://alexander-rogers.biz/  This test is not yet approved or cleared by the [place] FDA and  has been authorized for detection and/or diagnosis of SARS-CoV-2 by FDA under an Emergency Use Authorization (EUA).  This EUA will remain in effect (meaning this test can be used) for the duration of  the COV                          ID-19 declaration under Section 564(b)(1) of the Act, 21 U.S.C. section 360bbb-3(b)(1), unless the authorization  is terminated or revoked sooner.     Valproic Acid Lvl 10/04/2021 51  50.0 - 100.0 ug/mL Final   Performed at [facility], [address] [place], [place] [address]   Valproic Acid Lvl 10/08/2021 57  50.0 - 100.0  ug/mL Final   Performed at [facility], [address], [place], [place] [address]  Admission on 09/09/2021, Discharged on 09/10/2021  Component Date Value Ref Range Status   Sodium 09/09/2021 134 (L)  135 - 145 mmol/L Final   Potassium 09/09/2021 4.3  3.5 - 5.1 mmol/L Final   Chloride 09/09/2021 99  98 - 111 mmol/L Final   CO2 09/09/2021 25  22 - 32 mmol/L Final   Glucose, Bld 09/09/2021 107 (H)  70 - 99 mg/dL Final   Glucose reference range applies only to samples taken after fasting for at least 8 hours.   BUN 09/09/2021 12  6 - 20 mg/dL Final   Creatinine, Ser 09/09/2021 0.74  0.44 - 1.00 mg/dL Final   Calcium 09/81/1914 9.0  8.9 - 10.3 mg/dL Final   Total Protein 78/29/5621 7.4  6.5 - 8.1 g/dL Final   Albumin 30/86/5784 3.1 (L)  3.5 - 5.0 g/dL Final   AST 69/62/9528 13 (L)  15 - 41 U/L Final   ALT 09/09/2021 13  0 - 44 U/L Final   Alkaline Phosphatase 09/09/2021 66  38 - 126 U/L Final   Total Bilirubin 09/09/2021 0.2 (L)  0.3 - 1.2 mg/dL Final   GFR, Estimated 09/09/2021 >60  >60 mL/min Final   Comment: (NOTE) Calculated using the CKD-EPI Creatinine Equation (2021)    Anion gap 09/09/2021 10  5 - 15 Final   Performed at [facility], [address], [place], [place] [address]   WBC 09/09/2021 8.5  4.0 - 10.5 K/uL Final   RBC 09/09/2021 4.53  3.87 - 5.11 MIL/uL Final   Hemoglobin 09/09/2021 11.8 (L)  12.0 - 15.0 g/dL Final   HCT 40/11/2723 38.0  36.0 - 46.0 % Final   MCV 09/09/2021 83.9  80.0 - 100.0 fL Final   MCH 09/09/2021 26.0  26.0 - 34.0 pg Final   MCHC 09/09/2021 31.1  30.0 - 36.0 g/dL Final   RDW 36/64/4034 17.8 (H)  11.5 - 15.5 % Final   Platelets 09/09/2021 442 (H)  150 - 400 K/uL Final   nRBC 09/09/2021 0.0  0.0 - 0.2 % Final   Neutrophils Relative % 09/09/2021 70  % Final   Neutro Abs 09/09/2021 5.9  1.7 - 7.7 K/uL Final   Lymphocytes Relative 09/09/2021 20  % Final   Lymphs Abs 09/09/2021 1.7  0.7 - 4.0 K/uL Final   Monocytes Relative 09/09/2021 8   % Final   Monocytes Absolute 09/09/2021 0.7  0.1 - 1.0 K/uL Final   Eosinophils Relative 09/09/2021 1  % Final   Eosinophils Absolute 09/09/2021 0.1  0.0 - 0.5 K/uL Final   Basophils Relative 09/09/2021 1  % Final   Basophils Absolute 09/09/2021 0.1  0.0 - 0.1 K/uL Final   Immature Granulocytes 09/09/2021 0  % Final   Abs Immature Granulocytes 09/09/2021 0.03  0.00 - 0.07 K/uL Final   Performed at [facility], [address], [place], [place] [address]   Ammonia 09/09/2021 37 (H)  9 - 35 umol/L Final   Comment: HEMOLYSIS AT THIS LEVEL MAY AFFECT RESULT Performed at [facility], [address], [place], [place] [address]  Opiates 09/09/2021 NONE DETECTED  NONE DETECTED Final   Cocaine 09/09/2021 NONE DETECTED  NONE DETECTED Final   Benzodiazepines 09/09/2021 NONE DETECTED  NONE DETECTED Final   Amphetamines 09/09/2021 NONE DETECTED  NONE DETECTED Final   Tetrahydrocannabinol 09/09/2021 NONE DETECTED  NONE DETECTED Final   Barbiturates 09/09/2021 NONE DETECTED  NONE DETECTED Final   Comment: (NOTE) DRUG SCREEN FOR MEDICAL PURPOSES ONLY.  IF CONFIRMATION IS NEEDED FOR ANY PURPOSE, NOTIFY LAB WITHIN 5 DAYS.  LOWEST DETECTABLE LIMITS FOR URINE DRUG SCREEN Drug Class                     Cutoff (ng/mL) Amphetamine and metabolites    1000 Barbiturate and metabolites    200 Benzodiazepine                 200 Tricyclics and metabolites     300 Opiates and metabolites        300 Cocaine and metabolites        300 THC                            50 Performed at [facility], [address] [place], [place] [address]    Alcohol, Ethyl (B) 09/09/2021 <10  <10 mg/dL Final   Comment: (NOTE) Lowest detectable limit for serum alcohol is 10 mg/dL.  For medical purposes only. Performed at [facility], [address], [place], [place] [address]    Lipase 09/09/2021 33  11 - 51 U/L Final   Performed at [facility], [address], [place], [place]  [address]   Color, Urine 09/09/2021 COLORLESS (A)  YELLOW Final   APPearance 09/09/2021 CLEAR  CLEAR Final   Specific Gravity, Urine 09/09/2021 1.002 (L)  1.005 - 1.030 Final   pH 09/09/2021 6.0  5.0 - 8.0 Final   Glucose, UA 09/09/2021 NEGATIVE  NEGATIVE mg/dL Final   Hgb urine dipstick 09/09/2021 NEGATIVE  NEGATIVE Final   Bilirubin Urine 09/09/2021 NEGATIVE  NEGATIVE Final   Ketones, ur 09/09/2021 NEGATIVE  NEGATIVE mg/dL Final   Protein, ur 21/19/4174 NEGATIVE  NEGATIVE mg/dL Final   Nitrite 10/12/4816 NEGATIVE  NEGATIVE Final   Leukocytes,Ua 09/09/2021 NEGATIVE  NEGATIVE Final   Performed at [facility], [address], [place], [place] [address]   Specimen Description 09/09/2021 URINE, CLEAN CATCH   Final   Special Requests 09/09/2021 NONE   Final   Culture 09/09/2021  (A)   Final                   Value:<10,000 COLONIES/mL INSIGNIFICANT GROWTH Performed at [name], [address], [place], [place] [address]    Report Status 09/09/2021 09/10/2021 FINAL   Final   D-Dimer, Quant 09/09/2021 0.92 (H)  0.00 - 0.50 ug/mL-FEU Final   Comment: (NOTE) At the manufacturer cut-off value of 0.5 g/mL FEU, this assay has a negative predictive value of 95-100%.This assay is intended for use in conjunction with a clinical pretest probability (PTP) assessment model to exclude pulmonary embolism (PE) and deep venous thrombosis (DVT) in outpatients suspected of PE or DVT. Results should be correlated with clinical presentation. Performed at [facility], [address], [place], [place] [address]    Troponin I (High Sensitivity) 09/09/2021 5  <18 ng/L Final   Comment: (NOTE) Elevated high sensitivity troponin I (hsTnI) values and significant  changes across  serial measurements may suggest ACS but many other  chronic and acute conditions are known to elevate hsTnI results.  Refer to the "Links" section for chest pain algorithms and additional  guidance. Performed at  [facility], [address] [place], [place] [address]    BP 09/10/2021 135/83  mmHg Final   S' Lateral 09/10/2021 2.30  cm Final   Area-P 1/2 09/10/2021 5.58  cm2 Final    Blood Alcohol level:  Lab Results  Component Value Date   ETH <10 09/09/2021   ETH <10 10/20/2020    Metabolic Disorder Labs: Lab Results  Component Value Date   HGBA1C 7.0 (H) 10/04/2021   MPG 154.2 10/04/2021   No results found for: "PROLACTIN" Lab Results  Component Value Date   CHOL 178 10/04/2021   TRIG 155 (H) 10/04/2021   HDL 52 10/04/2021   CHOLHDL 3.4 10/04/2021   VLDL 31 10/04/2021   LDLCALC 95 10/04/2021    Therapeutic Lab Levels: No results found for: "LITHIUM" Lab Results  Component Value Date   VALPROATE 57 10/08/2021   VALPROATE 51 10/04/2021   No results found for: "CBMZ"  Physical Findings   Flowsheet Row ED from 10/04/2021 in [facility] ED from 09/09/2021 in [facility] EMERGENCY DEPART[facility] ED from 08/22/2021 in [facility] Dept  C-SSRS RISK CATEGORY No Risk No Risk No Risk        Musculoskeletal  Strength & Muscle Tone: within normal limits Gait & Station: normal Patient leans: N/A  Psychiatric Specialty Exam  Presentation  General Appearance: Disheveled  Eye Contact:Good  Speech:Clear and Coherent  Speech Volume:Normal  Handedness:Right   Mood and Affect  Mood:Euthymic  Affect:Constricted   Thought Process  Thought Processes:Coherent; Linear  Descriptions of Associations:Intact  Orientation:Full (Time, Place and Person)  Thought Content:Logical  Diagnosis of Schizophrenia or Schizoaffective disorder in past: Yes  Duration of Psychotic Symptoms: Greater than six months   Hallucinations:Hallucinations: None  Ideas of Reference:None  Suicidal Thoughts:Suicidal Thoughts: No  Homicidal Thoughts:Homicidal Thoughts: No   Sensorium  Memory:Immediate Fair; Recent Fair;  Remote Fair  Judgment:Poor  Insight:Poor   Executive Functions  Concentration:Poor  Attention Span:Poor  Recall:Poor  Fund of Knowledge:Poor  Language:Poor   Psychomotor Activity  Psychomotor Activity:Psychomotor Activity: Normal   Assets  Assets:Communication Skills; Desire for Improvement   Sleep  Sleep:Sleep: Fair   Physical Exam  Physical Exam Constitutional:      Appearance: the patient is not toxic-appearing.  Pulmonary:     Effort: Pulmonary effort is normal.  Neurological:     General: No focal deficit present.     Mental Status: the patient is alert and oriented to person, place, and time.   Review of Systems  Respiratory:  Negative for shortness of breath.   Cardiovascular:  Negative for chest pain.  Gastrointestinal:  Negative for abdominal pain, constipation, diarrhea, nausea and vomiting.  Neurological:  Negative for headaches.   Blood pressure 97/70, pulse (!) 101, temperature 98 F (36.7 C), temperature source Oral, resp. rate 18, SpO2 95 %. There is no height or weight on file to calculate BMI.  Treatment Plan Summary: Daily contact with patient to assess and evaluate symptoms and progress in treatment and Medication management  Status: Voluntary Disposition: TBD, difficult to place given ID and schizophrenia with aggression -LCSW working on disposition planning  Hx of schizophrenia and aggression Continue home medications as listed - Discontinued Haldol 10 mg 3 times daily because this  is not currently being prescribed by her outpatient provider -- Continue Zyprexa 20 mg QHS (in place of Lybalvi) - Continue Cogentin 1 mg twice daily - Continue Clozapine 50 mg twice daily, ACTT confirms patient is prescribed this medicine - Ingrezza 40 mg daily - Depakote extended release 500 mg twice daily    Labs: VPA trough of 51 8/6, 57 on 8/10 UDS with oxy A1C of 7 Hgb of 11.6 MCV of 80, will need outpatient follow up   Carlyn Reichert,  MD 10/08/2021 12:50 PM

## 2021-10-08 NOTE — ED Notes (Signed)
Patient calm and cooperative. Patient complaint with medication administration. Patient requested and provided with a night snack. Patient respirations are even and unlabored. Will continue to monitor for safety.

## 2021-10-08 NOTE — ED Notes (Signed)
Pt sleeping at present, respirations even & unlabored.  Monitoring for safety.

## 2021-10-08 NOTE — ED Notes (Signed)
Pt sleeping at present, no distress noted. Respirations even & unlabored.  Monitoring for safety. 

## 2021-10-08 NOTE — ED Notes (Signed)
Pt denied SI/HI/AVH. Accepted scheduled morning meds w/o difficulty. Pt resting on the pull out bed/chair w/I distress. Safety maintained and will continue to monitor.

## 2021-10-08 NOTE — ED Notes (Signed)
Breakfast provided.

## 2021-10-09 LAB — GLUCOSE, CAPILLARY: Glucose-Capillary: 123 mg/dL — ABNORMAL HIGH (ref 70–99)

## 2021-10-09 NOTE — ED Notes (Signed)
Provider interacting with pt. No acute distress noted. Safety maintained.

## 2021-10-09 NOTE — ED Notes (Signed)
Pt sleeping@this time. Breathing even and unlabored. Will continue to monitor for safety 

## 2021-10-09 NOTE — ED Provider Notes (Addendum)
Behavioral Health Progress Note  Date and Time: 10/09/2021 8:27 AM Name: Teresa Murillo MRN:  [id number]  Subjective:   Teresa Murillo is a 61 year old female with a past psychiatric history of schizophrenia, aggressive behavior, and possible intellectual disability.  She presented to the [facility] behavioral health urgent care after an argument with staff members at her group home that resulted in her signing discharge paperwork and leaving.  Per previous documentation, the group home staff members have reported that the patient has a history of intellectual disability.  Cannot find this documented elsewhere in the chart.  It is certainly possible, as the patient is unable to state any of her home medications and is unable to perform simple math problems.  The patient reports that she receives a disability check each month. The patient is connected with [facility] but does not receive ACT services.  The patient has a care coordinator with [facility] named Ms. Green, with whom the LCSW has frequently been communicating with to determine proper disposition for the patient.   On assessment this morning, the patient is calm and cooperative.  Per nursing documentation the patient has also been calm and cooperative.  Presently she does not appear to be psychotic and she does not admit to any auditory or visual hallucinations.  She denies suicidal or homicidal thoughts.  She reports appropriate mood, sleep, and appetite.   LCSW discussed with Ms. Green the possibility of the patient can return to her group home if "community supports" are in place before discharge.  LCSW plans for patient to receive CST services, which should allow her to return to group home on Monday.     Diagnosis:  Final diagnoses:  Schizophrenia, unspecified type (HCC)    Total Time spent with patient: 15 minutes  Past Psychiatric History: as above Past Medical History:  Past Medical History:  Diagnosis Date    Chronic obstructive pulmonary disease (COPD) (HCC)    Glaucoma    Hyperlipidemia    Hypertension    Iron deficiency    Schizoaffective disorder (HCC)    Type 2 diabetes mellitus (HCC)     Past Surgical History:  Procedure Laterality Date   TUBAL LIGATION     Family History:  Family History  Problem Relation Age of Onset   Breast cancer Maternal Grandmother    Family Psychiatric  History: as per H and P Social History:  Social History   Substance and Sexual Activity  Alcohol Use Yes     Social History   Substance and Sexual Activity  Drug Use Not Currently    Social History   Socioeconomic History   Marital status: Divorced    Spouse name: Not on file   Number of children: Not on file   Years of education: Not on file   Highest education level: Not on file  Occupational History   Not on file  Tobacco Use   Smoking status: Every Day    Packs/day: 1.00    Types: Cigars, Cigarettes   Smokeless tobacco: Current  Vaping Use   Vaping Use: Never used  Substance and Sexual Activity   Alcohol use: Yes   Drug use: Not Currently   Sexual activity: Not Currently    Birth control/protection: Surgical  Other Topics Concern   Not on file  Social History Narrative   Not on file   Social Determinants of Health   Financial Resource Strain: Not on file  Food Insecurity: Not on file  Transportation Needs: Not  on file  Physical Activity: Not on file  Stress: Not on file  Social Connections: Not on file   SDOH:  SDOH Screenings   Alcohol Screen: Not on file  Depression (PHQ2-9): Not on file  Financial Resource Strain: Not on file  Food Insecurity: Not on file  Housing: Not on file  Physical Activity: Not on file  Social Connections: Not on file  Stress: Not on file  Tobacco Use: High Risk (09/09/2021)   Patient History    Smoking Tobacco Use: Every Day    Smokeless Tobacco Use: Current    Passive Exposure: Not on file  Transportation Needs: Not on file    Additional Social History:    Pain Medications: See MAR Prescriptions: See MAR Over the Counter: See MAR History of alcohol / drug use?: No history of alcohol / drug abuse Longest period of sobriety (when/how long): N/A Negative Consequences of Use:  (N/A) Withdrawal Symptoms:  (N/A)                    Sleep: Fair  Appetite:  Fair  Current Medications:  Current Facility-Administered Medications  Medication Dose Route Frequency Provider Last Rate Last Admin   acetaminophen (TYLENOL) tablet 650 mg  650 mg Oral Q6H PRN Lauree Chandler, NP       alum & mag hydroxide-simeth (MAALOX/MYLANTA) 200-200-20 MG/5ML suspension 30 mL  30 mL Oral Q4H PRN Lauree Chandler, NP       benztropine (COGENTIN) tablet 1 mg  1 mg Oral BID Lauree Chandler, NP   1 mg at 10/08/21 2122   cloZAPine (CLOZARIL) tablet 50 mg  50 mg Oral BID Lauree Chandler, NP   50 mg at 10/08/21 2122   divalproex (DEPAKOTE ER) 24 hr tablet 500 mg  500 mg Oral BID Lauree Chandler, NP   500 mg at 10/08/21 2122   latanoprost (XALATAN) 0.005 % ophthalmic solution 1 drop  1 drop Both Eyes QHS Lauree Chandler, NP   1 drop at 10/08/21 2124   lisinopril (ZESTRIL) tablet 10 mg  10 mg Oral Daily Lauree Chandler, NP   10 mg at 10/08/21 0955   magnesium hydroxide (MILK OF MAGNESIA) suspension 30 mL  30 mL Oral Daily PRN Lauree Chandler, NP       metFORMIN (GLUCOPHAGE) tablet 500 mg  500 mg Oral BID WC Lauree Chandler, NP   500 mg at 10/08/21 1612   mometasone-formoterol (DULERA) 200-5 MCG/ACT inhaler 2 puff  2 puff Inhalation BID Lauree Chandler, NP   2 puff at 10/08/21 2004   nicotine (NICODERM CQ - dosed in mg/24 hr) patch 7 mg  7 mg Transdermal Daily Lauree Chandler, NP   7 mg at 10/08/21 0955   OLANZapine (ZYPREXA) tablet 20 mg  20 mg Oral QHS Carlyn Reichert, MD   20 mg at 10/08/21 2121   valbenazine (INGREZZA) capsule 40 mg  40 mg Oral q AM Lauree Chandler, NP   40 mg at 10/09/21  0355   Current Outpatient Medications  Medication Sig Dispense Refill   benztropine (COGENTIN) 1 MG tablet Take 1 mg by mouth in the morning and at bedtime.     budesonide-formoterol (SYMBICORT) 160-4.5 MCG/ACT inhaler Inhale 2 puffs into the lungs in the morning and at bedtime.     Cholecalciferol (VITAMIN D3) 1.25 MG (50000 UT) CAPS Take 50,000 Units by mouth every Thursday.     clozapine (CLOZARIL) 50 MG tablet  Take 50 mg by mouth 2 (two) times daily.     divalproex (DEPAKOTE ER) 500 MG 24 hr tablet Take 500 mg by mouth in the morning and at bedtime.     haloperidol (HALDOL) 10 MG tablet Take 10 mg by mouth 3 (three) times daily.     INGREZZA 40 MG capsule Take 40 mg by mouth in the morning.     latanoprost (XALATAN) 0.005 % ophthalmic solution Place 1 drop into both eyes at bedtime.     lisinopril (ZESTRIL) 10 MG tablet Take 10 mg by mouth daily.     metFORMIN (GLUCOPHAGE) 500 MG tablet Take 500 mg by mouth 2 (two) times daily with a meal.     nicotine (NICODERM CQ - DOSED IN MG/24 HR) 7 mg/24hr patch Place 7 mg onto the skin daily.     OLANZapine-Samidorphan (LYBALVI) 20-10 MG TABS Take 1 tablet by mouth at bedtime.     PROAIR HFA 108 (90 Base) MCG/ACT inhaler Inhale 2 puffs into the lungs every 6 (six) hours as needed for wheezing or shortness of breath.     zolpidem (AMBIEN) 10 MG tablet Take 10 mg by mouth at bedtime.      Labs  Lab Results:  Admission on 10/04/2021  Component Date Value Ref Range Status   SARS Coronavirus 2 by RT PCR 10/04/2021 NEGATIVE  NEGATIVE Final   Comment: (NOTE) SARS-CoV-2 target nucleic acids are NOT DETECTED.  The SARS-CoV-2 RNA is generally detectable in upper respiratory specimens during the acute phase of infection. The lowest concentration of SARS-CoV-2 viral copies this assay can detect is 138 copies/mL. A negative result does not preclude SARS-Cov-2 infection and should not be used as the sole basis for treatment or other patient management  decisions. A negative result may occur with  improper specimen collection/handling, submission of specimen other than nasopharyngeal swab, presence of viral mutation(s) within the areas targeted by this assay, and inadequate number of viral copies(<138 copies/mL). A negative result must be combined with clinical observations, patient history, and epidemiological information. The expected result is Negative.  Fact Sheet for Patients:  BloggerCourse.com  Fact Sheet for Healthcare Providers:  SeriousBroker.it  This test is no                          t yet approved or cleared by the [place] FDA and  has been authorized for detection and/or diagnosis of SARS-CoV-2 by FDA under an Emergency Use Authorization (EUA). This EUA will remain  in effect (meaning this test can be used) for the duration of the COVID-19 declaration under Section 564(b)(1) of the Act, 21 U.S.C.section 360bbb-3(b)(1), unless the authorization is terminated  or revoked sooner.       Influenza A by PCR 10/04/2021 NEGATIVE  NEGATIVE Final   Influenza B by PCR 10/04/2021 NEGATIVE  NEGATIVE Final   Comment: (NOTE) The Xpert Xpress SARS-CoV-2/FLU/RSV plus assay is intended as an aid in the diagnosis of influenza from Nasopharyngeal swab specimens and should not be used as a sole basis for treatment. Nasal washings and aspirates are unacceptable for Xpert Xpress SARS-CoV-2/FLU/RSV testing.  Fact Sheet for Patients: BloggerCourse.com  Fact Sheet for Healthcare Providers: SeriousBroker.it  This test is not yet approved or cleared by the [place] FDA and has been authorized for detection and/or diagnosis of SARS-CoV-2 by FDA under an Emergency Use Authorization (EUA). This EUA will remain in effect (meaning this test can be used) for the  duration of the COVID-19 declaration under Section 564(b)(1) of the Act,  21 U.S.C. section 360bbb-3(b)(1), unless the authorization is terminated or revoked.  Performed at [facility] Lab, [address] [place], [place] [address]    WBC 10/04/2021 8.4  4.0 - 10.5 K/uL Final   RBC 10/04/2021 4.42  3.87 - 5.11 MIL/uL Final   Hemoglobin 10/04/2021 11.6 (L)  12.0 - 15.0 g/dL Final   HCT 02/72/5366 35.7 (L)  36.0 - 46.0 % Final   MCV 10/04/2021 80.8  80.0 - 100.0 fL Final   MCH 10/04/2021 26.2  26.0 - 34.0 pg Final   MCHC 10/04/2021 32.5  30.0 - 36.0 g/dL Final   RDW 44/04/4740 16.0 (H)  11.5 - 15.5 % Final   Platelets 10/04/2021 372  150 - 400 K/uL Final   nRBC 10/04/2021 0.0  0.0 - 0.2 % Final   Neutrophils Relative % 10/04/2021 68  % Final   Neutro Abs 10/04/2021 5.7  1.7 - 7.7 K/uL Final   Lymphocytes Relative 10/04/2021 22  % Final   Lymphs Abs 10/04/2021 1.8  0.7 - 4.0 K/uL Final   Monocytes Relative 10/04/2021 8  % Final   Monocytes Absolute 10/04/2021 0.7  0.1 - 1.0 K/uL Final   Eosinophils Relative 10/04/2021 1  % Final   Eosinophils Absolute 10/04/2021 0.1  0.0 - 0.5 K/uL Final   Basophils Relative 10/04/2021 1  % Final   Basophils Absolute 10/04/2021 0.1  0.0 - 0.1 K/uL Final   Immature Granulocytes 10/04/2021 0  % Final   Abs Immature Granulocytes 10/04/2021 0.03  0.00 - 0.07 K/uL Final   Performed at [facility] Lab, [address] [place], [place] [address]   Sodium 10/04/2021 136  135 - 145 mmol/L Final   Potassium 10/04/2021 4.2  3.5 - 5.1 mmol/L Final   Chloride 10/04/2021 104  98 - 111 mmol/L Final   CO2 10/04/2021 25  22 - 32 mmol/L Final   Glucose, Bld 10/04/2021 100 (H)  70 - 99 mg/dL Final   Glucose reference range applies only to samples taken after fasting for at least 8 hours.   BUN 10/04/2021 9  6 - 20 mg/dL Final   Creatinine, Ser 10/04/2021 0.52  0.44 - 1.00 mg/dL Final   Calcium 87/56/4332 9.0  8.9 - 10.3 mg/dL Final   Total Protein 95/18/8416 7.0  6.5 - 8.1 g/dL Final   Albumin 60/63/0160 3.2 (L)  3.5 - 5.0 g/dL  Final   AST 10/93/2355 13 (L)  15 - 41 U/L Final   ALT 10/04/2021 10  0 - 44 U/L Final   Alkaline Phosphatase 10/04/2021 61  38 - 126 U/L Final   Total Bilirubin 10/04/2021 0.3  0.3 - 1.2 mg/dL Final   GFR, Estimated 10/04/2021 >60  >60 mL/min Final   Comment: (NOTE) Calculated using the CKD-EPI Creatinine Equation (2021)    Anion gap 10/04/2021 7  5 - 15 Final   Performed at [facility] Lab, [address] [place], [place] [address]   Hgb A1c MFr Bld 10/04/2021 7.0 (H)  4.8 - 5.6 % Final   Comment: (NOTE) Pre diabetes:          5.7%-6.4%  Diabetes:              >6.4%  Glycemic control for   <7.0% adults with diabetes    Mean Plasma Glucose 10/04/2021 154.2  mg/dL Final   Performed at [facility], [address] [place], [place] [address]   Cholesterol 10/04/2021 178  0 - 200 mg/dL Final   Triglycerides 21/30/8657 155 (H)  <150 mg/dL Final   HDL 84/69/6295 52  >40 mg/dL Final   Total CHOL/HDL Ratio 10/04/2021 3.4  RATIO Final   VLDL 10/04/2021 31  0 - 40 mg/dL Final   LDL Cholesterol 10/04/2021 95  0 - 99 mg/dL Final   Comment:        Total Cholesterol/HDL:CHD Risk Coronary Heart Disease Risk Table                     Men   Women  1/2 Average Risk   3.4   3.3  Average Risk       5.0   4.4  2 X Average Risk   9.6   7.1  3 X Average Risk  23.4   11.0        Use the calculated Patient Ratio above and the CHD Risk Table to determine the patient's CHD Risk.        ATP III CLASSIFICATION (LDL):  <100     mg/dL   Optimal  284-132  mg/dL   Near or Above                    Optimal  130-159  mg/dL   Borderline  440-102  mg/dL   High  >725     mg/dL   Very High Performed at [facility], [address], [place], [place] [address]    POC Amphetamine UR 10/04/2021 None Detected  NONE DETECTED (Cut Off Level 1000 ng/mL) Final   POC Secobarbital (BAR) 10/04/2021 None Detected  NONE DETECTED (Cut Off Level 300 ng/mL) Final   POC Buprenorphine (BUP) 10/04/2021  None Detected  NONE DETECTED (Cut Off Level 10 ng/mL) Final   POC Oxazepam (BZO) 10/04/2021 None Detected  NONE DETECTED (Cut Off Level 300 ng/mL) Final   POC Cocaine UR 10/04/2021 None Detected  NONE DETECTED (Cut Off Level 300 ng/mL) Final   POC Methamphetamine UR 10/04/2021 None Detected  NONE DETECTED (Cut Off Level 1000 ng/mL) Final   POC Morphine 10/04/2021 None Detected  NONE DETECTED (Cut Off Level 300 ng/mL) Final   POC Methadone UR 10/04/2021 None Detected  NONE DETECTED (Cut Off Level 300 ng/mL) Final   POC Oxycodone UR 10/04/2021 Positive (A)  NONE DETECTED (Cut Off Level 100 ng/mL) Final   POC Marijuana UR 10/04/2021 None Detected  NONE DETECTED (Cut Off Level 50 ng/mL) Final   SARSCOV2ONAVIRUS 2 AG 10/04/2021 NEGATIVE  NEGATIVE Final   Comment: (NOTE) SARS-CoV-2 antigen NOT DETECTED.   Negative results are presumptive.  Negative results do not preclude SARS-CoV-2 infection and should not be used as the sole basis for treatment or other patient management decisions, including infection  control decisions, particularly in the presence of clinical signs and  symptoms consistent with COVID-19, or in those who have been in contact with the virus.  Negative results must be combined with clinical observations, patient history, and epidemiological information. The expected result is Negative.  Fact Sheet for Patients: https://www.jennings-kim.com/  Fact Sheet for Healthcare Providers: https://alexander-rogers.biz/  This test is not yet approved or cleared by the [place] FDA and  has been authorized for detection and/or diagnosis of SARS-CoV-2 by FDA under an Emergency Use Authorization (EUA).  This EUA will remain in effect (meaning this test can be used) for the duration of  the COV  ID-19 declaration under Section 564(b)(1) of the Act, 21 U.S.C. section 360bbb-3(b)(1), unless the authorization is terminated or revoked  sooner.     Valproic Acid Lvl 10/04/2021 51  50.0 - 100.0 ug/mL Final   Performed at [facility], [address] [place], [place] [address]   Valproic Acid Lvl 10/08/2021 57  50.0 - 100.0 ug/mL Final   Performed at [facility], [address] [place], [place] [address]   Glucose-Capillary 10/09/2021 123 (H)  70 - 99 mg/dL Final   Glucose reference range applies only to samples taken after fasting for at least 8 hours.  Admission on 09/09/2021, Discharged on 09/10/2021  Component Date Value Ref Range Status   Sodium 09/09/2021 134 (L)  135 - 145 mmol/L Final   Potassium 09/09/2021 4.3  3.5 - 5.1 mmol/L Final   Chloride 09/09/2021 99  98 - 111 mmol/L Final   CO2 09/09/2021 25  22 - 32 mmol/L Final   Glucose, Bld 09/09/2021 107 (H)  70 - 99 mg/dL Final   Glucose reference range applies only to samples taken after fasting for at least 8 hours.   BUN 09/09/2021 12  6 - 20 mg/dL Final   Creatinine, Ser 09/09/2021 0.74  0.44 - 1.00 mg/dL Final   Calcium 34/74/2595 9.0  8.9 - 10.3 mg/dL Final   Total Protein 63/87/5643 7.4  6.5 - 8.1 g/dL Final   Albumin 32/95/1884 3.1 (L)  3.5 - 5.0 g/dL Final   AST 16/60/6301 13 (L)  15 - 41 U/L Final   ALT 09/09/2021 13  0 - 44 U/L Final   Alkaline Phosphatase 09/09/2021 66  38 - 126 U/L Final   Total Bilirubin 09/09/2021 0.2 (L)  0.3 - 1.2 mg/dL Final   GFR, Estimated 09/09/2021 >60  >60 mL/min Final   Comment: (NOTE) Calculated using the CKD-EPI Creatinine Equation (2021)    Anion gap 09/09/2021 10  5 - 15 Final   Performed at [facility], [address] [place], [place] [address]   WBC 09/09/2021 8.5  4.0 - 10.5 K/uL Final   RBC 09/09/2021 4.53  3.87 - 5.11 MIL/uL Final   Hemoglobin 09/09/2021 11.8 (L)  12.0 - 15.0 g/dL Final   HCT 32/35/5732 38.0  36.0 - 46.0 % Final   MCV 09/09/2021 83.9  80.0 - 100.0 fL Final   MCH 09/09/2021 26.0  26.0 - 34.0 pg Final   MCHC 09/09/2021 31.1  30.0 - 36.0 g/dL Final   RDW 20/25/4270 17.8  (H)  11.5 - 15.5 % Final   Platelets 09/09/2021 442 (H)  150 - 400 K/uL Final   nRBC 09/09/2021 0.0  0.0 - 0.2 % Final   Neutrophils Relative % 09/09/2021 70  % Final   Neutro Abs 09/09/2021 5.9  1.7 - 7.7 K/uL Final   Lymphocytes Relative 09/09/2021 20  % Final   Lymphs Abs 09/09/2021 1.7  0.7 - 4.0 K/uL Final   Monocytes Relative 09/09/2021 8  % Final   Monocytes Absolute 09/09/2021 0.7  0.1 - 1.0 K/uL Final   Eosinophils Relative 09/09/2021 1  % Final   Eosinophils Absolute 09/09/2021 0.1  0.0 - 0.5 K/uL Final   Basophils Relative 09/09/2021 1  % Final   Basophils Absolute 09/09/2021 0.1  0.0 - 0.1 K/uL Final   Immature Granulocytes 09/09/2021 0  % Final   Abs Immature Granulocytes 09/09/2021 0.03  0.00 - 0.07 K/uL Final   Performed at [facility]  Lab, [address] [place], [place] [address]   Ammonia 09/09/2021 37 (H)  9 - 35 umol/L Final   Comment: HEMOLYSIS AT THIS LEVEL MAY AFFECT RESULT Performed at [facility], [address] [place], [place] [address]    Opiates 09/09/2021 NONE DETECTED  NONE DETECTED Final   Cocaine 09/09/2021 NONE DETECTED  NONE DETECTED Final   Benzodiazepines 09/09/2021 NONE DETECTED  NONE DETECTED Final   Amphetamines 09/09/2021 NONE DETECTED  NONE DETECTED Final   Tetrahydrocannabinol 09/09/2021 NONE DETECTED  NONE DETECTED Final   Barbiturates 09/09/2021 NONE DETECTED  NONE DETECTED Final   Comment: (NOTE) DRUG SCREEN FOR MEDICAL PURPOSES ONLY.  IF CONFIRMATION IS NEEDED FOR ANY PURPOSE, NOTIFY LAB WITHIN 5 DAYS.  LOWEST DETECTABLE LIMITS FOR URINE DRUG SCREEN Drug Class                     Cutoff (ng/mL) Amphetamine and metabolites    1000 Barbiturate and metabolites    200 Benzodiazepine                 200 Tricyclics and metabolites     300 Opiates and metabolites        300 Cocaine and metabolites        300 THC                            50 Performed at [facility], [address] [place], [place] [address]     Alcohol, Ethyl (B) 09/09/2021 <10  <10 mg/dL Final   Comment: (NOTE) Lowest detectable limit for serum alcohol is 10 mg/dL.  For medical purposes only. Performed at [facility], [address] [place], [place] [address]    Lipase 09/09/2021 33  11 - 51 U/L Final   Performed at [facility], [address] [place], [place] [address]   Color, Urine 09/09/2021 COLORLESS (A)  YELLOW Final   APPearance 09/09/2021 CLEAR  CLEAR Final   Specific Gravity, Urine 09/09/2021 1.002 (L)  1.005 - 1.030 Final   pH 09/09/2021 6.0  5.0 - 8.0 Final   Glucose, UA 09/09/2021 NEGATIVE  NEGATIVE mg/dL Final   Hgb urine dipstick 09/09/2021 NEGATIVE  NEGATIVE Final   Bilirubin Urine 09/09/2021 NEGATIVE  NEGATIVE Final   Ketones, ur 09/09/2021 NEGATIVE  NEGATIVE mg/dL Final   Protein, ur 66/44/034707/01/2022 NEGATIVE  NEGATIVE mg/dL Final   Nitrite 42/59/563807/01/2022 NEGATIVE  NEGATIVE Final   Leukocytes,Ua 09/09/2021 NEGATIVE  NEGATIVE Final   Performed at [facility], [address] [place], [place] [address]   Specimen Description 09/09/2021 URINE, CLEAN CATCH   Final   Special Requests 09/09/2021 NONE   Final   Culture 09/09/2021  (A)   Final                   Value:<10,000 COLONIES/mL INSIGNIFICANT GROWTH Performed at [facility]  [facility], [address] [place], [place] [address]    Report Status 09/09/2021 09/10/2021 FINAL   Final   D-Dimer, Quant 09/09/2021 0.92 (H)  0.00 - 0.50 ug/mL-FEU Final   Comment: (NOTE) At the manufacturer cut-off value of 0.5 g/mL FEU, this assay has a negative predictive value of 95-100%.This assay is intended for use in conjunction with a clinical pretest probability (PTP) assessment model to exclude pulmonary embolism (PE) and deep venous thrombosis (DVT) in outpatients suspected of PE or DVT. Results should be  correlated with clinical presentation. Performed at [facility], [address], [place], [place] [address]    Troponin I (High Sensitivity)  09/09/2021 5  <18 ng/L Final   Comment: (NOTE) Elevated high sensitivity troponin I (hsTnI) values and significant  changes across serial measurements may suggest ACS but many other  chronic and acute conditions are known to elevate hsTnI results.  Refer to the "Links" section for chest pain algorithms and additional  guidance. Performed at [facility] Lab, [address], [place], [place] [address]    BP 09/10/2021 135/83  mmHg Final   S' Lateral 09/10/2021 2.30  cm Final   Area-P 1/2 09/10/2021 5.58  cm2 Final    Blood Alcohol level:  Lab Results  Component Value Date   ETH <10 09/09/2021   ETH <10 10/20/2020    Metabolic Disorder Labs: Lab Results  Component Value Date   HGBA1C 7.0 (H) 10/04/2021   MPG 154.2 10/04/2021   No results found for: "PROLACTIN" Lab Results  Component Value Date   CHOL 178 10/04/2021   TRIG 155 (H) 10/04/2021   HDL 52 10/04/2021   CHOLHDL 3.4 10/04/2021   VLDL 31 10/04/2021   LDLCALC 95 10/04/2021    Therapeutic Lab Levels: No results found for: "LITHIUM" Lab Results  Component Value Date   VALPROATE 57 10/08/2021   VALPROATE 51 10/04/2021   No results found for: "CBMZ"  Physical Findings   Flowsheet Row ED from 10/04/2021 in [facility] ED from 09/09/2021 in [facility] EMERGENCY DEPARTMENT ED from 08/22/2021 in [facility] Dept  C-SSRS RISK CATEGORY No Risk No Risk No Risk        Musculoskeletal  Strength & Muscle Tone: within normal limits Gait & Station: normal Patient leans: N/A  Psychiatric Specialty Exam  Presentation  General Appearance: Disheveled  Eye Contact:Good  Speech:Clear and Coherent  Speech Volume:Normal  Handedness:Right   Mood and Affect  Mood:Euthymic  Affect:Constricted   Thought Process  Thought Processes:Coherent; Linear  Descriptions of Associations:Intact  Orientation:Full (Time, Place and Person)  Thought  Content:Logical  Diagnosis of Schizophrenia or Schizoaffective disorder in past: Yes  Duration of Psychotic Symptoms: Greater than six months   Hallucinations:Hallucinations: None  Ideas of Reference:None  Suicidal Thoughts:Suicidal Thoughts: No  Homicidal Thoughts:Homicidal Thoughts: No   Sensorium  Memory:Immediate Fair; Recent Fair; Remote Fair  Judgment:Poor  Insight:Poor   Executive Functions  Concentration:Poor  Attention Span:Poor  Recall:Poor  Fund of Knowledge:Poor  Language:Poor   Psychomotor Activity  Psychomotor Activity:Psychomotor Activity: Normal   Assets  Assets:Communication Skills; Desire for Improvement   Sleep  Sleep:Sleep: Fair   Physical Exam  Physical Exam Constitutional:      Appearance: the patient is not toxic-appearing.  Pulmonary:     Effort: Pulmonary effort is normal.  Neurological:     General: No focal deficit present.     Mental Status: the patient is alert and oriented to person, place, and time.   Review of Systems  Respiratory:  Negative for shortness of breath.   Cardiovascular:  Negative for chest pain.  Gastrointestinal:  Negative for abdominal pain, constipation, diarrhea, nausea and vomiting.  Neurological:  Negative for headaches.   Blood pressure 108/74, pulse (!) 106, temperature 98.3 F (36.8 C), temperature source Oral, resp. rate 20, SpO2 100 %. There is no height or weight on file to calculate BMI.  Treatment Plan Summary: Daily contact with patient to assess and evaluate symptoms and  progress in treatment and Medication management  Status: Voluntary Disposition: TBD, difficult to place given ID diagnosis and schizophrenia with aggression -LCSW working on disposition planning  Hx of schizophrenia and aggression Continue home medications as listed - Discontinued Haldol 10 mg 3 times daily because this is not currently being prescribed by her outpatient provider -- Continue Zyprexa 20 mg QHS (in  place of Lybalvi) - Continue Cogentin 1 mg twice daily - Continue Clozapine 50 mg twice daily, ACTT confirms patient is prescribed this medicine - Ingrezza 40 mg daily - Depakote extended release 500 mg twice daily    Labs: VPA trough of 51 8/6, 57 on 8/10 UDS with oxy A1C of 7 Hgb of 11.6 MCV of 80, will need outpatient follow up   Carlyn Reichert, MD 10/09/2021 8:27 AM

## 2021-10-09 NOTE — ED Notes (Signed)
Pt eating lunch in no acute distress. Denies concerns or needs at present. Informed to notify staff with any needs. Will continue to monitor for safety.

## 2021-10-09 NOTE — ED Notes (Signed)
Pt sleeping at present, no distress noted. Respirations even & unlabored.  Monitoring for safety. 

## 2021-10-09 NOTE — ED Notes (Signed)
Pt awake & resting at present, no distress noted. Calm & cooperative at present.  Monitoring for safety.

## 2021-10-09 NOTE — ED Notes (Signed)
Pt sleeping in no acute distress. RR even and unlabored. Safety maintained. 

## 2021-10-09 NOTE — ED Notes (Signed)
Patient A&Ox4. Denies intent to harm self/others when asked. Denies A/VH. Patient denies any physical complaints when asked. No acute distress noted. Routine safety checks conducted according to facility protocol. Encouraged patient to notify staff if thoughts of harm toward self or others arise. Patient verbalize understanding and agreement. Will continue to monitor for safety.    

## 2021-10-10 NOTE — ED Notes (Signed)
Pt sleeping in no acute distress. RR even and unlabored. Safety maintained. 

## 2021-10-10 NOTE — ED Notes (Signed)
Pt resting at present, calm & cooperative.  No distress noted.  Monitoring for safety. 

## 2021-10-10 NOTE — ED Notes (Signed)
Pt sitting watching tv and eating dinner in no acute distress. RR even and unlabored. Safety maintained.

## 2021-10-10 NOTE — ED Notes (Signed)
Pt awake, alert & responsive, no distress noted. Calm & cooperative at present.  Monitoring for safety.

## 2021-10-10 NOTE — ED Notes (Signed)
Pt sleeping at present, no distress noted.  Monitoring for safety. 

## 2021-10-10 NOTE — ED Provider Notes (Signed)
Behavioral Health Progress Note  Date and Time: 10/10/2021 12:42 PM Name: Teresa Murillo MRN:  [id number]  Subjective:    Teresa Murillo is a 61 y/o female w/ past psychiatric hx of schizophrenia, aggressive bhx, possible intellectual disability presenting to [facility] on 10/04/21 following an argument with staff members at her group home that resulted in her signing discharge paperwork and leaving. She has been at [facility] since 10/04/21 as LCSW assists w/ disposition planning.   Per nursing notes, pt has been calm and cooperative on the unit. She has been compliant w/ medications.  On reassessment, pt denies suicidal, homicidal, violent ideations. She denies auditory visual hallucinations or paranoia. Discussed w/ pt continued disposition planning. Pt verbalizes understanding. She denies any concerns at this time.  Diagnosis:  Final diagnoses:  Schizophrenia, unspecified type ([place])    Total Time spent with patient: 20 minutes  Past Psychiatric History: Hx of schizophrenia, aggressive bhx, possible intellectual disability Past Medical History:  Past Medical History:  Diagnosis Date   Chronic obstructive pulmonary disease (COPD) ([place])    Glaucoma    Hyperlipidemia    Hypertension    Iron deficiency    Schizoaffective disorder ([place])    Type 2 diabetes mellitus ([place])     Past Surgical History:  Procedure Laterality Date   TUBAL LIGATION     Family History:  Family History  Problem Relation Age of Onset   Breast cancer Maternal Grandmother    Family Psychiatric  History: Unknown to pt Social History:  Social History   Substance and Sexual Activity  Alcohol Use Yes     Social History   Substance and Sexual Activity  Drug Use Not Currently    Social History   Socioeconomic History   Marital status: Divorced    Spouse name: Not on file   Number of children: Not on file   Years of education: Not on file   Highest education level: Not on file  Occupational History   Not  on file  Tobacco Use   Smoking status: Every Day    Packs/day: 1.00    Types: Cigars, Cigarettes   Smokeless tobacco: Current  Vaping Use   Vaping Use: Never used  Substance and Sexual Activity   Alcohol use: Yes   Drug use: Not Currently   Sexual activity: Not Currently    Birth control/protection: Surgical  Other Topics Concern   Not on file  Social History Narrative   Not on file   Social Determinants of Health   Financial Resource Strain: Not on file  Food Insecurity: Not on file  Transportation Needs: Not on file  Physical Activity: Not on file  Stress: Not on file  Social Connections: Not on file   SDOH:  SDOH Screenings   Alcohol Screen: Not on file  Depression (PHQ2-9): Not on file  Financial Resource Strain: Not on file  Food Insecurity: Not on file  Housing: Not on file  Physical Activity: Not on file  Social Connections: Not on file  Stress: Not on file  Tobacco Use: High Risk (09/09/2021)   Patient History    Smoking Tobacco Use: Every Day    Smokeless Tobacco Use: Current    Passive Exposure: Not on file  Transportation Needs: Not on file   Additional Social History:    Pain Medications: See MAR Prescriptions: See MAR Over the Counter: See MAR History of alcohol / drug use?: No history of alcohol / drug abuse Longest period of sobriety (when/how long): N/A Negative  Consequences of Use:  (N/A) Withdrawal Symptoms:  (N/A)                    Sleep: Good  Appetite:  Good  Current Medications:  Current Facility-Administered Medications  Medication Dose Route Frequency Provider Last Rate Last Admin   acetaminophen (TYLENOL) tablet 650 mg  650 mg Oral Q6H PRN Lauree Chandler, NP       alum & mag hydroxide-simeth (MAALOX/MYLANTA) 200-200-20 MG/5ML suspension 30 mL  30 mL Oral Q4H PRN Lauree Chandler, NP       benztropine (COGENTIN) tablet 1 mg  1 mg Oral BID Lauree Chandler, NP   1 mg at 10/10/21 0940   cloZAPine (CLOZARIL)  tablet 50 mg  50 mg Oral BID Lauree Chandler, NP   50 mg at 10/10/21 0940   divalproex (DEPAKOTE ER) 24 hr tablet 500 mg  500 mg Oral BID Lauree Chandler, NP   500 mg at 10/10/21 0940   latanoprost (XALATAN) 0.005 % ophthalmic solution 1 drop  1 drop Both Eyes QHS Lauree Chandler, NP   1 drop at 10/09/21 2107   lisinopril (ZESTRIL) tablet 10 mg  10 mg Oral Daily Lauree Chandler, NP   10 mg at 10/10/21 0940   magnesium hydroxide (MILK OF MAGNESIA) suspension 30 mL  30 mL Oral Daily PRN Lauree Chandler, NP       metFORMIN (GLUCOPHAGE) tablet 500 mg  500 mg Oral BID WC Lauree Chandler, NP   500 mg at 10/10/21 0900   mometasone-formoterol (DULERA) 200-5 MCG/ACT inhaler 2 puff  2 puff Inhalation BID Lauree Chandler, NP   2 puff at 10/10/21 0900   nicotine (NICODERM CQ - dosed in mg/24 hr) patch 7 mg  7 mg Transdermal Daily Lauree Chandler, NP   7 mg at 10/10/21 0940   OLANZapine (ZYPREXA) tablet 20 mg  20 mg Oral QHS Carlyn Reichert, MD   20 mg at 10/09/21 2106   valbenazine (INGREZZA) capsule 40 mg  40 mg Oral q AM Lauree Chandler, NP   40 mg at 10/10/21 7564   Current Outpatient Medications  Medication Sig Dispense Refill   benztropine (COGENTIN) 1 MG tablet Take 1 mg by mouth in the morning and at bedtime.     budesonide-formoterol (SYMBICORT) 160-4.5 MCG/ACT inhaler Inhale 2 puffs into the lungs in the morning and at bedtime.     Cholecalciferol (VITAMIN D3) 1.25 MG (50000 UT) CAPS Take 50,000 Units by mouth every Thursday.     clozapine (CLOZARIL) 50 MG tablet Take 50 mg by mouth 2 (two) times daily.     divalproex (DEPAKOTE ER) 500 MG 24 hr tablet Take 500 mg by mouth in the morning and at bedtime.     haloperidol (HALDOL) 10 MG tablet Take 10 mg by mouth 3 (three) times daily.     INGREZZA 40 MG capsule Take 40 mg by mouth in the morning.     latanoprost (XALATAN) 0.005 % ophthalmic solution Place 1 drop into both eyes at bedtime.     lisinopril (ZESTRIL) 10  MG tablet Take 10 mg by mouth daily.     metFORMIN (GLUCOPHAGE) 500 MG tablet Take 500 mg by mouth 2 (two) times daily with a meal.     nicotine (NICODERM CQ - DOSED IN MG/24 HR) 7 mg/24hr patch Place 7 mg onto the skin daily.     OLANZapine-Samidorphan (LYBALVI) 20-10 MG TABS Take 1  tablet by mouth at bedtime.     PROAIR HFA 108 (90 Base) MCG/ACT inhaler Inhale 2 puffs into the lungs every 6 (six) hours as needed for wheezing or shortness of breath.     zolpidem (AMBIEN) 10 MG tablet Take 10 mg by mouth at bedtime.      Labs  Lab Results:  Admission on 10/04/2021  Component Date Value Ref Range Status   SARS Coronavirus 2 by RT PCR 10/04/2021 NEGATIVE  NEGATIVE Final   Comment: (NOTE) SARS-CoV-2 target nucleic acids are NOT DETECTED.  The SARS-CoV-2 RNA is generally detectable in upper respiratory specimens during the acute phase of infection. The lowest concentration of SARS-CoV-2 viral copies this assay can detect is 138 copies/mL. A negative result does not preclude SARS-Cov-2 infection and should not be used as the sole basis for treatment or other patient management decisions. A negative result may occur with  improper specimen collection/handling, submission of specimen other than nasopharyngeal swab, presence of viral mutation(s) within the areas targeted by this assay, and inadequate number of viral copies(<138 copies/mL). A negative result must be combined with clinical observations, patient history, and epidemiological information. The expected result is Negative.  Fact Sheet for Patients:  BloggerCourse.comhttps://www.fda.gov/media/152166/download  Fact Sheet for Healthcare Providers:  SeriousBroker.ithttps://www.fda.gov/media/152162/download  This test is no                          t yet approved or cleared by the [place] FDA and  has been authorized for detection and/or diagnosis of SARS-CoV-2 by FDA under an Emergency Use Authorization (EUA). This EUA will remain  in effect (meaning this  test can be used) for the duration of the COVID-19 declaration under Section 564(b)(1) of the Act, 21 U.S.C.section 360bbb-3(b)(1), unless the authorization is terminated  or revoked sooner.       Influenza A by PCR 10/04/2021 NEGATIVE  NEGATIVE Final   Influenza B by PCR 10/04/2021 NEGATIVE  NEGATIVE Final   Comment: (NOTE) The Xpert Xpress SARS-CoV-2/FLU/RSV plus assay is intended as an aid in the diagnosis of influenza from Nasopharyngeal swab specimens and should not be used as a sole basis for treatment. Nasal washings and aspirates are unacceptable for Xpert Xpress SARS-CoV-2/FLU/RSV testing.  Fact Sheet for Patients: BloggerCourse.comhttps://www.fda.gov/media/152166/download  Fact Sheet for Healthcare Providers: SeriousBroker.ithttps://www.fda.gov/media/152162/download  This test is not yet approved or cleared by the [place] FDA and has been authorized for detection and/or diagnosis of SARS-CoV-2 by FDA under an Emergency Use Authorization (EUA). This EUA will remain in effect (meaning this test can be used) for the duration of the COVID-19 declaration under Section 564(b)(1) of the Act, 21 U.S.C. section 360bbb-3(b)(1), unless the authorization is terminated or revoked.  Performed at [facility], [address] [place], [place] [id number]    WBC 10/04/2021 8.4  4.0 - 10.5 K/uL Final   RBC 10/04/2021 4.42  3.87 - 5.11 MIL/uL Final   Hemoglobin 10/04/2021 11.6 (L)  12.0 - 15.0 g/dL Final   HCT 91/47/829508/07/2021 35.7 (L)  36.0 - 46.0 % Final   MCV 10/04/2021 80.8  80.0 - 100.0 fL Final   MCH 10/04/2021 26.2  26.0 - 34.0 pg Final   MCHC 10/04/2021 32.5  30.0 - 36.0 g/dL Final   RDW 62/13/086508/07/2021 16.0 (H)  11.5 - 15.5 % Final   Platelets 10/04/2021 372  150 - 400 K/uL Final   nRBC 10/04/2021 0.0  0.0 - 0.2 % Final   Neutrophils  Relative % 10/04/2021 68  % Final   Neutro Abs 10/04/2021 5.7  1.7 - 7.7 K/uL Final   Lymphocytes Relative 10/04/2021 22  % Final   Lymphs Abs 10/04/2021 1.8  0.7 - 4.0  K/uL Final   Monocytes Relative 10/04/2021 8  % Final   Monocytes Absolute 10/04/2021 0.7  0.1 - 1.0 K/uL Final   Eosinophils Relative 10/04/2021 1  % Final   Eosinophils Absolute 10/04/2021 0.1  0.0 - 0.5 K/uL Final   Basophils Relative 10/04/2021 1  % Final   Basophils Absolute 10/04/2021 0.1  0.0 - 0.1 K/uL Final   Immature Granulocytes 10/04/2021 0  % Final   Abs Immature Granulocytes 10/04/2021 0.03  0.00 - 0.07 K/uL Final   Performed at [facility], [address], [place], [place] [address]   Sodium 10/04/2021 136  135 - 145 mmol/L Final   Potassium 10/04/2021 4.2  3.5 - 5.1 mmol/L Final   Chloride 10/04/2021 104  98 - 111 mmol/L Final   CO2 10/04/2021 25  22 - 32 mmol/L Final   Glucose, Bld 10/04/2021 100 (H)  70 - 99 mg/dL Final   Glucose reference range applies only to samples taken after fasting for at least 8 hours.   BUN 10/04/2021 9  6 - 20 mg/dL Final   Creatinine, Ser 10/04/2021 0.52  0.44 - 1.00 mg/dL Final   Calcium 48/25/0037 9.0  8.9 - 10.3 mg/dL Final   Total Protein 04/88/8916 7.0  6.5 - 8.1 g/dL Final   Albumin 94/50/3888 3.2 (L)  3.5 - 5.0 g/dL Final   AST 28/00/3491 13 (L)  15 - 41 U/L Final   ALT 10/04/2021 10  0 - 44 U/L Final   Alkaline Phosphatase 10/04/2021 61  38 - 126 U/L Final   Total Bilirubin 10/04/2021 0.3  0.3 - 1.2 mg/dL Final   GFR, Estimated 10/04/2021 >60  >60 mL/min Final   Comment: (NOTE) Calculated using the CKD-EPI Creatinine Equation (2021)    Anion gap 10/04/2021 7  5 - 15 Final   Performed at [facility], [address] [place], [place] [address]   Hgb A1c MFr Bld 10/04/2021 7.0 (H)  4.8 - 5.6 % Final   Comment: (NOTE) Pre diabetes:          5.7%-6.4%  Diabetes:              >6.4%  Glycemic control for   <7.0% adults with diabetes    Mean Plasma Glucose 10/04/2021 154.2  mg/dL Final   Performed at [facility], [address], [place], [place] [address]   Cholesterol 10/04/2021 178  0 - 200 mg/dL Final    Triglycerides 10/04/2021 155 (H)  <150 mg/dL Final   HDL 48/03/6551 52  >40 mg/dL Final   Total CHOL/HDL Ratio 10/04/2021 3.4  RATIO Final   VLDL 10/04/2021 31  0 - 40 mg/dL Final   LDL Cholesterol 10/04/2021 95  0 - 99 mg/dL Final   Comment:        Total Cholesterol/HDL:CHD Risk Coronary Heart Disease Risk Table                     Men   Women  1/2 Average Risk   3.4   3.3  Average Risk       5.0   4.4  2 X Average Risk   9.6   7.1  3 X Average Risk  23.4  11.0        Use the calculated Patient Ratio above and the CHD Risk Table to determine the patient's CHD Risk.        ATP III CLASSIFICATION (LDL):  <100     mg/dL   Optimal  100-129  mg/dL   Near or Above                    Optimal  130-159  mg/dL   Borderline  160-189  mg/dL   High  >190     mg/dL   Very High Performed at [facility] [address] [place], [place] [address]    POC Amphetamine UR 10/04/2021 None Detected  NONE DETECTED (Cut Off Level 1000 ng/mL) Final   POC Secobarbital (BAR) 10/04/2021 None Detected  NONE DETECTED (Cut Off Level 300 ng/mL) Final   POC Buprenorphine (BUP) 10/04/2021 None Detected  NONE DETECTED (Cut Off Level 10 ng/mL) Final   POC Oxazepam (BZO) 10/04/2021 None Detected  NONE DETECTED (Cut Off Level 300 ng/mL) Final   POC Cocaine UR 10/04/2021 None Detected  NONE DETECTED (Cut Off Level 300 ng/mL) Final   POC Methamphetamine UR 10/04/2021 None Detected  NONE DETECTED (Cut Off Level 1000 ng/mL) Final   POC Morphine 10/04/2021 None Detected  NONE DETECTED (Cut Off Level 300 ng/mL) Final   POC Methadone UR 10/04/2021 None Detected  NONE DETECTED (Cut Off Level 300 ng/mL) Final   POC Oxycodone UR 10/04/2021 Positive (A)  NONE DETECTED (Cut Off Level 100 ng/mL) Final   POC Marijuana UR 10/04/2021 None Detected  NONE DETECTED (Cut Off Level 50 ng/mL) Final   SARSCOV2ONAVIRUS 2 AG 10/04/2021 NEGATIVE  NEGATIVE Final   Comment: (NOTE) SARS-CoV-2 antigen NOT DETECTED.   Negative results  are presumptive.  Negative results do not preclude SARS-CoV-2 infection and should not be used as the sole basis for treatment or other patient management decisions, including infection  control decisions, particularly in the presence of clinical signs and  symptoms consistent with COVID-19, or in those who have been in contact with the virus.  Negative results must be combined with clinical observations, patient history, and epidemiological information. The expected result is Negative.  Fact Sheet for Patients: HandmadeRecipes.com.cy  Fact Sheet for Healthcare Providers: FuneralLife.at  This test is not yet approved or cleared by the [place] FDA and  has been authorized for detection and/or diagnosis of SARS-CoV-2 by FDA under an Emergency Use Authorization (EUA).  This EUA will remain in effect (meaning this test can be used) for the duration of  the COV                          ID-19 declaration under Section 564(b)(1) of the Act, 21 U.S.C. section 360bbb-3(b)(1), unless the authorization is terminated or revoked sooner.     Valproic Acid Lvl 10/04/2021 51  50.0 - 100.0 ug/mL Final   Performed at [facility] [address] [place], [place] [address]   Valproic Acid Lvl 10/08/2021 57  50.0 - 100.0 ug/mL Final   Performed at [facility] [address] [place], [place] [address]   Glucose-Capillary 10/09/2021 123 (H)  70 - 99 mg/dL Final   Glucose reference range applies only to samples taken after fasting for at least 8 hours.  Admission on 09/09/2021, Discharged on 09/10/2021  Component Date Value Ref Range Status   Sodium 09/09/2021 134 (L)  135 -  145 mmol/L Final   Potassium 09/09/2021 4.3  3.5 - 5.1 mmol/L Final   Chloride 09/09/2021 99  98 - 111 mmol/L Final   CO2 09/09/2021 25  22 - 32 mmol/L Final   Glucose, Bld 09/09/2021 107 (H)  70 - 99 mg/dL Final   Glucose reference range applies only to samples  taken after fasting for at least 8 hours.   BUN 09/09/2021 12  6 - 20 mg/dL Final   Creatinine, Ser 09/09/2021 0.74  0.44 - 1.00 mg/dL Final   Calcium 09/09/2021 9.0  8.9 - 10.3 mg/dL Final   Total Protein 09/09/2021 7.4  6.5 - 8.1 g/dL Final   Albumin 09/09/2021 3.1 (L)  3.5 - 5.0 g/dL Final   AST 09/09/2021 13 (L)  15 - 41 U/L Final   ALT 09/09/2021 13  0 - 44 U/L Final   Alkaline Phosphatase 09/09/2021 66  38 - 126 U/L Final   Total Bilirubin 09/09/2021 0.2 (L)  0.3 - 1.2 mg/dL Final   GFR, Estimated 09/09/2021 >60  >60 mL/min Final   Comment: (NOTE) Calculated using the CKD-EPI Creatinine Equation (2021)    Anion gap 09/09/2021 10  5 - 15 Final   Performed at [facility], [place] [address] [place], [place] [address]   WBC 09/09/2021 8.5  4.0 - 10.5 K/uL Final   RBC 09/09/2021 4.53  3.87 - 5.11 MIL/uL Final   Hemoglobin 09/09/2021 11.8 (L)  12.0 - 15.0 g/dL Final   HCT 09/09/2021 38.0  36.0 - 46.0 % Final   MCV 09/09/2021 83.9  80.0 - 100.0 fL Final   MCH 09/09/2021 26.0  26.0 - 34.0 pg Final   MCHC 09/09/2021 31.1  30.0 - 36.0 g/dL Final   RDW 09/09/2021 17.8 (H)  11.5 - 15.5 % Final   Platelets 09/09/2021 442 (H)  150 - 400 K/uL Final   nRBC 09/09/2021 0.0  0.0 - 0.2 % Final   Neutrophils Relative % 09/09/2021 70  % Final   Neutro Abs 09/09/2021 5.9  1.7 - 7.7 K/uL Final   Lymphocytes Relative 09/09/2021 20  % Final   Lymphs Abs 09/09/2021 1.7  0.7 - 4.0 K/uL Final   Monocytes Relative 09/09/2021 8  % Final   Monocytes Absolute 09/09/2021 0.7  0.1 - 1.0 K/uL Final   Eosinophils Relative 09/09/2021 1  % Final   Eosinophils Absolute 09/09/2021 0.1  0.0 - 0.5 K/uL Final   Basophils Relative 09/09/2021 1  % Final   Basophils Absolute 09/09/2021 0.1  0.0 - 0.1 K/uL Final   Immature Granulocytes 09/09/2021 0  % Final   Abs Immature Granulocytes 09/09/2021 0.03  0.00 - 0.07 K/uL Final   Performed at [facility], [place] [address], [place], [place] [address]   Ammonia  09/09/2021 37 (H)  9 - 35 umol/L Final   Comment: HEMOLYSIS AT THIS LEVEL MAY AFFECT RESULT Performed at [facility], [place] [address], [place], [place] [address]    Opiates 09/09/2021 NONE DETECTED  NONE DETECTED Final   Cocaine 09/09/2021 NONE DETECTED  NONE DETECTED Final   Benzodiazepines 09/09/2021 NONE DETECTED  NONE DETECTED Final   Amphetamines 09/09/2021 NONE DETECTED  NONE DETECTED Final   Tetrahydrocannabinol 09/09/2021 NONE DETECTED  NONE DETECTED Final   Barbiturates 09/09/2021 NONE DETECTED  NONE DETECTED Final   Comment: (NOTE) DRUG SCREEN FOR MEDICAL PURPOSES ONLY.  IF CONFIRMATION IS NEEDED FOR ANY PURPOSE, NOTIFY LAB WITHIN 5 DAYS.  LOWEST DETECTABLE  LIMITS FOR URINE DRUG SCREEN Drug Class                     Cutoff (ng/mL) Amphetamine and metabolites    1000 Barbiturate and metabolites    200 Benzodiazepine                 200 Tricyclics and metabolites     300 Opiates and metabolites        300 Cocaine and metabolites        300 THC                            50 Performed at [facility], [address] [place], [place] [address]    Alcohol, Ethyl (B) 09/09/2021 <10  <10 mg/dL Final   Comment: (NOTE) Lowest detectable limit for serum alcohol is 10 mg/dL.  For medical purposes only. Performed at [facility], [address] [place], [place] [address]    Lipase 09/09/2021 33  11 - 51 U/L Final   Performed at [facility], [address], [place], [place] [address]   Color, Urine 09/09/2021 COLORLESS (A)  YELLOW Final   APPearance 09/09/2021 CLEAR  CLEAR Final   Specific Gravity, Urine 09/09/2021 1.002 (L)  1.005 - 1.030 Final   pH 09/09/2021 6.0  5.0 - 8.0 Final   Glucose, UA 09/09/2021 NEGATIVE  NEGATIVE mg/dL Final   Hgb urine dipstick 09/09/2021 NEGATIVE  NEGATIVE Final   Bilirubin Urine 09/09/2021 NEGATIVE  NEGATIVE Final   Ketones, ur 09/09/2021 NEGATIVE  NEGATIVE mg/dL Final   Protein, ur 24/10/7351 NEGATIVE  NEGATIVE  mg/dL Final   Nitrite 29/92/4268 NEGATIVE  NEGATIVE Final   Leukocytes,Ua 09/09/2021 NEGATIVE  NEGATIVE Final   Performed at [facility], [address] [place], [place] [address]   Specimen Description 09/09/2021 URINE, CLEAN CATCH   Final   Special Requests 09/09/2021 NONE   Final   Culture 09/09/2021  (A)   Final                   Value:<10,000 COLONIES/mL INSIGNIFICANT GROWTH Performed at [facility], [address], [place], [place] [address]    Report Status 09/09/2021 09/10/2021 FINAL   Final   D-Dimer, Quant 09/09/2021 0.92 (H)  0.00 - 0.50 ug/mL-FEU Final   Comment: (NOTE) At the manufacturer cut-off value of 0.5 g/mL FEU, this assay has a negative predictive value of 95-100%.This assay is intended for use in conjunction with a clinical pretest probability (PTP) assessment model to exclude pulmonary embolism (PE) and deep venous thrombosis (DVT) in outpatients suspected of PE or DVT. Results should be correlated with clinical presentation. Performed at [facility], [address], [place], [place] [address]    Troponin I (High Sensitivity) 09/09/2021 5  <18 ng/L Final   Comment: (NOTE) Elevated high sensitivity troponin I (hsTnI) values and significant  changes across serial measurements may suggest ACS but many other  chronic and acute conditions are known to elevate hsTnI results.  Refer to the "Links" section for chest pain algorithms and additional  guidance. Performed at [facility], [address], [place], [place] [address]    BP 09/10/2021 135/83  mmHg Final   S' Lateral 09/10/2021 2.30  cm Final   Area-P 1/2 09/10/2021 5.58  cm2 Final    Blood Alcohol level:  Lab Results  Component  Value Date   ETH <10 09/09/2021   ETH <10 [id number]    Metabolic Disorder Labs: Lab Results  Component Value Date   HGBA1C 7.0 (H) 10/04/2021   MPG 154.2 10/04/2021   No results found for: "PROLACTIN" Lab Results  Component Value Date    CHOL 178 10/04/2021   TRIG 155 (H) 10/04/2021   HDL 52 10/04/2021   CHOLHDL 3.4 10/04/2021   VLDL 31 10/04/2021   [place] 95 10/04/2021    Therapeutic Lab Levels: No results found for: "LITHIUM" Lab Results  Component Value Date   VALPROATE 57 10/08/2021   VALPROATE 51 10/04/2021   No results found for: "CBMZ"  Physical Findings   Flowsheet Row ED from 10/04/2021 in [facility] ED from 09/09/2021 in [facility] from 08/22/2021 in [facility]  [place] No Risk No Risk No Risk        Musculoskeletal  Strength & Muscle Tone: within normal limits Gait & Station: normal Patient leans: N/A  Psychiatric Specialty Exam  Presentation  General Appearance: Disheveled  Eye Contact:Good  Speech:Clear and Coherent  Speech Volume:Normal  Handedness:Right   Mood and Affect  Mood:Euthymic  Affect:Constricted   Thought Process  Thought Processes:Coherent; Linear  Descriptions of Associations:Intact  Orientation:Full (Time, Place and Person)  Thought Content:Logical  Diagnosis of Schizophrenia or Schizoaffective disorder in past: Yes  Duration of Psychotic Symptoms: Greater than six months   Hallucinations:Hallucinations: None  Ideas of Reference:None  Suicidal Thoughts:Suicidal Thoughts: No  Homicidal Thoughts:Homicidal Thoughts: No   Sensorium  Memory:Immediate Fair; Recent Fair; Remote Fair  Judgment:Poor  Insight:Poor   Executive Functions  Concentration:Poor  Attention Span:Poor  Recall:Poor  Fund of Knowledge:Poor  Language:Poor   Psychomotor Activity  Psychomotor Activity:Psychomotor Activity: Normal   Assets  Assets:Communication Skills; Desire for Improvement   Sleep  Sleep:Sleep: Fair   No data recorded  Physical Exam  Physical Exam Pulmonary:     Effort: Pulmonary effort is normal.  Neurological:     Mental  Status: She is alert and oriented to person, place, and time.  Psychiatric:        Mood and Affect: Mood normal.        Speech: Speech normal.        Behavior: Behavior normal. Behavior is cooperative.        Thought Content: Thought content normal.    Review of Systems  Constitutional:  Negative for chills and fever.  Respiratory:  Negative for shortness of breath.   Cardiovascular:  Negative for chest pain and palpitations.  Gastrointestinal:  Negative for abdominal pain.  Neurological:  Negative for dizziness and headaches.  Psychiatric/Behavioral: Negative.     Blood pressure 133/72, pulse (!) 113, temperature 97.6 F (36.4 C), temperature source Oral, resp. rate 20, SpO2 100 %. There is no height or weight on file to calculate BMI.  Treatment Plan Summary: LCSW working on disposition planning.   Tharon Aquas, NP 10/10/2021 12:42 PM

## 2021-10-10 NOTE — ED Notes (Signed)
Pt was given cereal and milk for breakfast.  

## 2021-10-10 NOTE — ED Notes (Signed)
Pt sleeping but easily aroused to name being called. Denies SI/HI/AVH. Pleasant and cooperative with staff. No acute distress noted. Informed pt to notify staff with any needs or concerns. Will continue to monitor for safety.

## 2021-10-11 NOTE — ED Notes (Signed)
Received patient this AM. Patient is sleeping in her bed. Patient respirations are even and unlabored. Will continue to monitor for safety.  
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
## 2021-10-11 NOTE — ED Notes (Signed)
Pt assessed lying in bed in adult obs. A&Ox4 with no complaints of pain or acute distress at present. Calm and cooperative. Denies SI, HI, and AVH at present. Will continue to monitor for safety.

## 2021-10-11 NOTE — ED Notes (Signed)
Patient denies SI, HI, or AVH. Patient showered during the early afternoon and returned to bed. Patient has been pleasant and compliant. Patient has been sleeping all day.  Respirations are even and unlabored. No acute distress noted. Will continue to monitor for safety.

## 2021-10-11 NOTE — ED Notes (Signed)
Pt sleeping in bed. Respirations even and unlabored with no signs of acute distress noted. Will continue to monitor for safety.  

## 2021-10-11 NOTE — Discharge Instructions (Addendum)
Take all medications as prescribed. Keep all follow-up appointments as scheduled.  Do not consume alcohol or use illegal drugs while on prescription medications. Report any adverse effects from your medications to your primary care provider promptly.  In the event of recurrent symptoms or worsening symptoms, call 911, a crisis hotline, or go to the nearest emergency department for evaluation.   

## 2021-10-12 MED ORDER — HYDROXYZINE HCL 25 MG PO TABS
50.0000 mg | ORAL_TABLET | Freq: Three times a day (TID) | ORAL | Status: DC | PRN
  Administered 2021-10-12 – 2021-10-21 (×4): 50 mg via ORAL
  Filled 2021-10-12 (×4): qty 2

## 2021-10-12 NOTE — ED Provider Notes (Signed)
Behavioral Health Progress Note  Date and Time: 10/12/2021 4:27 PM Name: Teresa Murillo MRN:  [id number]  Subjective:   Teresa Murillo is a 61 year old female with a past psychiatric history of schizophrenia, aggressive behavior, and possible intellectual disability.  She presented to the [facility] behavioral health urgent care after an argument with staff members at her group home that resulted in her signing discharge paperwork and leaving.  Per previous documentation, the group home staff members have reported that the patient has a history of intellectual disability.  Cannot find this documented elsewhere in the chart.  It is certainly possible, as the patient is unable to state any of her home medications and is unable to perform simple math problems.  The patient reports that she receives a disability check each month. The patient is connected with [facility] but does not receive ACT services.  The patient has a care coordinator with [facility] named Ms. Green, with whom the LCSW has frequently been communicating with to determine proper disposition for the patient.   On assessment this morning, the patient is calm and cooperative.  Per nursing documentation the patient has also been calm and cooperative.  Presently she does not appear to be psychotic and she does not admit to any auditory or visual hallucinations.  She denies suicidal or homicidal thoughts.  She reports appropriate mood, sleep, and appetite.   Informed today with final decision that the patient's former group home will not take her back.  Plan for LCSW to involve more senior case management as well as involve wake [employer]    Diagnosis:  Final diagnoses:  Schizophrenia, unspecified type ([place])    Total Time spent with patient: 15 minutes  Past Psychiatric History: as above Past Medical History:  Past Medical History:  Diagnosis Date   Chronic obstructive pulmonary disease (COPD)  ([place])    Glaucoma    Hyperlipidemia    Hypertension    Iron deficiency    Schizoaffective disorder ([place])    Type 2 diabetes mellitus ([place])     Past Surgical History:  Procedure Laterality Date   TUBAL LIGATION     Family History:  Family History  Problem Relation Age of Onset   Breast cancer Maternal Grandmother    Family Psychiatric  History: as per H and P Social History:  Social History   Substance and Sexual Activity  Alcohol Use Yes     Social History   Substance and Sexual Activity  Drug Use Not Currently    Social History   Socioeconomic History   Marital status: Divorced    Spouse name: Not on file   Number of children: Not on file   Years of education: Not on file   Highest education level: Not on file  Occupational History   Not on file  Tobacco Use   Smoking status: Every Day    Packs/day: 1.00    Types: Cigars, Cigarettes   Smokeless tobacco: Current  Vaping Use   Vaping Use: Never used  Substance and Sexual Activity   Alcohol use: Yes   Drug use: Not Currently   Sexual activity: Not Currently    Birth control/protection: Surgical  Other Topics Concern   Not on file  Social History Narrative   Not on file   Social Determinants of Health   Financial Resource Strain: Not on file  Food Insecurity: Not on file  Transportation Needs: Not on file  Physical Activity: Not on file  Stress: Not on file  Social Connections: Not on file   SDOH:  SDOH Screenings   Alcohol Screen: Not on file  Depression (PHQ2-9): Not on file  Financial Resource Strain: Not on file  Food Insecurity: Not on file  Housing: Not on file  Physical Activity: Not on file  Social Connections: Not on file  Stress: Not on file  Tobacco Use: High Risk (09/09/2021)   Patient History    Smoking Tobacco Use: Every Day    Smokeless Tobacco Use: Current    Passive Exposure: Not on file  Transportation Needs: Not on file   Additional Social History:    Pain  Medications: See MAR Prescriptions: See MAR Over the Counter: See MAR History of alcohol / drug use?: No history of alcohol / drug abuse Longest period of sobriety (when/how long): N/A Negative Consequences of Use:  (N/A) Withdrawal Symptoms:  (N/A)                    Sleep: Fair  Appetite:  Fair  Current Medications:  Current Facility-Administered Medications  Medication Dose Route Frequency Provider Last Rate Last Admin   acetaminophen (TYLENOL) tablet 650 mg  650 mg Oral Q6H PRN Tharon Aquas, NP   650 mg at 10/12/21 1015   alum & mag hydroxide-simeth (MAALOX/MYLANTA) 200-200-20 MG/5ML suspension 30 mL  30 mL Oral Q4H PRN Tharon Aquas, NP       benztropine (COGENTIN) tablet 1 mg  1 mg Oral BID Tharon Aquas, NP   1 mg at 10/12/21 0930   cloZAPine (CLOZARIL) tablet 50 mg  50 mg Oral BID Tharon Aquas, NP   50 mg at 10/12/21 0930   divalproex (DEPAKOTE ER) 24 hr tablet 500 mg  500 mg Oral BID Tharon Aquas, NP   500 mg at 10/12/21 0930   hydrOXYzine (ATARAX) tablet 50 mg  50 mg Oral TID PRN Corky Sox, MD   50 mg at 10/12/21 1043   latanoprost (XALATAN) 0.005 % ophthalmic solution 1 drop  1 drop Both Eyes QHS Tharon Aquas, NP   1 drop at 10/11/21 2140   lisinopril (ZESTRIL) tablet 10 mg  10 mg Oral Daily Tharon Aquas, NP   10 mg at 10/12/21 0930   magnesium hydroxide (MILK OF MAGNESIA) suspension 30 mL  30 mL Oral Daily PRN Tharon Aquas, NP       metFORMIN (GLUCOPHAGE) tablet 500 mg  500 mg Oral BID WC Tharon Aquas, NP   500 mg at 10/12/21 0801   mometasone-formoterol (DULERA) 200-5 MCG/ACT inhaler 2 puff  2 puff Inhalation BID Tharon Aquas, NP   2 puff at 10/12/21 0801   nicotine (NICODERM CQ - dosed in mg/24 hr) patch 7 mg  7 mg Transdermal Daily Tharon Aquas, NP   7 mg at 10/12/21 0929   OLANZapine (ZYPREXA) tablet 20 mg  20 mg Oral QHS Corky Sox, MD   20 mg at 10/11/21 2137   valbenazine  (INGREZZA) capsule 40 mg  40 mg Oral q AM Tharon Aquas, NP   40 mg at 10/12/21 0800   Current Outpatient Medications  Medication Sig Dispense Refill   benztropine (COGENTIN) 1 MG tablet Take 1 mg by mouth in the morning and at bedtime.     budesonide-formoterol (SYMBICORT) 160-4.5 MCG/ACT inhaler Inhale 2 puffs into the lungs in the morning and at bedtime.     Cholecalciferol (VITAMIN D3) 1.25 MG (50000 UT)  CAPS Take 50,000 Units by mouth every Thursday.     clozapine (CLOZARIL) 50 MG tablet Take 50 mg by mouth 2 (two) times daily.     divalproex (DEPAKOTE ER) 500 MG 24 hr tablet Take 500 mg by mouth in the morning and at bedtime.     haloperidol (HALDOL) 10 MG tablet Take 10 mg by mouth 3 (three) times daily.     INGREZZA 40 MG capsule Take 40 mg by mouth in the morning.     latanoprost (XALATAN) 0.005 % ophthalmic solution Place 1 drop into both eyes at bedtime.     lisinopril (ZESTRIL) 10 MG tablet Take 10 mg by mouth daily.     metFORMIN (GLUCOPHAGE) 500 MG tablet Take 500 mg by mouth 2 (two) times daily with a meal.     nicotine (NICODERM CQ - DOSED IN MG/24 HR) 7 mg/24hr patch Place 7 mg onto the skin daily.     OLANZapine-Samidorphan (LYBALVI) 20-10 MG TABS Take 1 tablet by mouth at bedtime.     PROAIR HFA 108 (90 Base) MCG/ACT inhaler Inhale 2 puffs into the lungs every 6 (six) hours as needed for wheezing or shortness of breath.     zolpidem (AMBIEN) 10 MG tablet Take 10 mg by mouth at bedtime.      Labs  Lab Results:  Admission on 10/04/2021  Component Date Value Ref Range Status   SARS Coronavirus 2 by RT PCR 10/04/2021 NEGATIVE  NEGATIVE Final   Comment: (NOTE) SARS-CoV-2 target nucleic acids are NOT DETECTED.  The SARS-CoV-2 RNA is generally detectable in upper respiratory specimens during the acute phase of infection. The lowest concentration of SARS-CoV-2 viral copies this assay can detect is 138 copies/mL. A negative result does not preclude SARS-Cov-2 infection  and should not be used as the sole basis for treatment or other patient management decisions. A negative result may occur with  improper specimen collection/handling, submission of specimen other than nasopharyngeal swab, presence of viral mutation(s) within the areas targeted by this assay, and inadequate number of viral copies(<138 copies/mL). A negative result must be combined with clinical observations, patient history, and epidemiological information. The expected result is Negative.  Fact Sheet for Patients:  EntrepreneurPulse.com.au  Fact Sheet for Healthcare Providers:  IncredibleEmployment.be  This test is no                          t yet approved or cleared by the [place] FDA and  has been authorized for detection and/or diagnosis of SARS-CoV-2 by FDA under an Emergency Use Authorization (EUA). This EUA will remain  in effect (meaning this test can be used) for the duration of the COVID-19 declaration under Section 564(b)(1) of the Act, 21 U.S.C.section 360bbb-3(b)(1), unless the authorization is terminated  or revoked sooner.       Influenza A by PCR 10/04/2021 NEGATIVE  NEGATIVE Final   Influenza B by PCR 10/04/2021 NEGATIVE  NEGATIVE Final   Comment: (NOTE) The Xpert Xpress SARS-CoV-2/FLU/RSV plus assay is intended as an aid in the diagnosis of influenza from Nasopharyngeal swab specimens and should not be used as a sole basis for treatment. Nasal washings and aspirates are unacceptable for Xpert Xpress SARS-CoV-2/FLU/RSV testing.  Fact Sheet for Patients: EntrepreneurPulse.com.au  Fact Sheet for Healthcare Providers: IncredibleEmployment.be  This test is not yet approved or cleared by the [place] FDA and has been authorized for detection and/or diagnosis of SARS-CoV-2 by FDA under an Emergency  Use Authorization (EUA). This EUA will remain in effect (meaning this test can be  used) for the duration of the COVID-19 declaration under Section 564(b)(1) of the Act, 21 U.S.C. section 360bbb-3(b)(1), unless the authorization is terminated or revoked.  Performed at [facility], [place] [address] [place], [place] [address]    WBC 10/04/2021 8.4  4.0 - 10.5 K/uL Final   RBC 10/04/2021 4.42  3.87 - 5.11 MIL/uL Final   Hemoglobin 10/04/2021 11.6 (L)  12.0 - 15.0 g/dL Final   HCT 10/04/2021 35.7 (L)  36.0 - 46.0 % Final   MCV 10/04/2021 80.8  80.0 - 100.0 fL Final   MCH 10/04/2021 26.2  26.0 - 34.0 pg Final   MCHC 10/04/2021 32.5  30.0 - 36.0 g/dL Final   RDW 10/04/2021 16.0 (H)  11.5 - 15.5 % Final   Platelets 10/04/2021 372  150 - 400 K/uL Final   nRBC 10/04/2021 0.0  0.0 - 0.2 % Final   Neutrophils Relative % 10/04/2021 68  % Final   Neutro Abs 10/04/2021 5.7  1.7 - 7.7 K/uL Final   Lymphocytes Relative 10/04/2021 22  % Final   Lymphs Abs 10/04/2021 1.8  0.7 - 4.0 K/uL Final   Monocytes Relative 10/04/2021 8  % Final   Monocytes Absolute 10/04/2021 0.7  0.1 - 1.0 K/uL Final   Eosinophils Relative 10/04/2021 1  % Final   Eosinophils Absolute 10/04/2021 0.1  0.0 - 0.5 K/uL Final   Basophils Relative 10/04/2021 1  % Final   Basophils Absolute 10/04/2021 0.1  0.0 - 0.1 K/uL Final   Immature Granulocytes 10/04/2021 0  % Final   Abs Immature Granulocytes 10/04/2021 0.03  0.00 - 0.07 K/uL Final   Performed at [facility], [place] [address] [place], [place] [address]   Sodium 10/04/2021 136  135 - 145 mmol/L Final   Potassium 10/04/2021 4.2  3.5 - 5.1 mmol/L Final   Chloride 10/04/2021 104  98 - 111 mmol/L Final   CO2 10/04/2021 25  22 - 32 mmol/L Final   Glucose, Bld 10/04/2021 100 (H)  70 - 99 mg/dL Final   Glucose reference range applies only to samples taken after fasting for at least 8 hours.   BUN 10/04/2021 9  6 - 20 mg/dL Final   Creatinine, Ser 10/04/2021 0.52  0.44 - 1.00 mg/dL Final   Calcium 10/04/2021 9.0  8.9 - 10.3 mg/dL Final   Total  Protein 10/04/2021 7.0  6.5 - 8.1 g/dL Final   Albumin 10/04/2021 3.2 (L)  3.5 - 5.0 g/dL Final   AST 10/04/2021 13 (L)  15 - 41 U/L Final   ALT 10/04/2021 10  0 - 44 U/L Final   Alkaline Phosphatase 10/04/2021 61  38 - 126 U/L Final   Total Bilirubin 10/04/2021 0.3  0.3 - 1.2 mg/dL Final   GFR, Estimated 10/04/2021 >60  >60 mL/min Final   Comment: (NOTE) Calculated using the CKD-EPI Creatinine Equation (2021)    Anion gap 10/04/2021 7  5 - 15 Final   Performed at [facility] [address], [place], [place] [address]   Hgb A1c MFr Bld 10/04/2021 7.0 (H)  4.8 - 5.6 % Final   Comment: (NOTE) Pre diabetes:          5.7%-6.4%  Diabetes:              >6.4%  Glycemic control for   <7.0% adults with diabetes    Mean  Plasma Glucose 10/04/2021 154.2  mg/dL Final   Performed at [facility], [address] [place], [place] [address]   Cholesterol 10/04/2021 178  0 - 200 mg/dL Final   Triglycerides 12/75/1700 155 (H)  <150 mg/dL Final   HDL 17/49/4496 52  >40 mg/dL Final   Total CHOL/HDL Ratio 10/04/2021 3.4  RATIO Final   VLDL 10/04/2021 31  0 - 40 mg/dL Final   LDL Cholesterol 10/04/2021 95  0 - 99 mg/dL Final   Comment:        Total Cholesterol/HDL:CHD Risk Coronary Heart Disease Risk Table                     Men   Women  1/2 Average Risk   3.4   3.3  Average Risk       5.0   4.4  2 X Average Risk   9.6   7.1  3 X Average Risk  23.4   11.0        Use the calculated Patient Ratio above and the CHD Risk Table to determine the patient's CHD Risk.        ATP III CLASSIFICATION (LDL):  <100     mg/dL   Optimal  759-163  mg/dL   Near or Above                    Optimal  130-159  mg/dL   Borderline  846-659  mg/dL   High  >935     mg/dL   Very High Performed at [facility], [address] [place], [place] [address]    POC Amphetamine UR 10/04/2021 None Detected  NONE DETECTED (Cut Off Level 1000 ng/mL) Final   POC Secobarbital (BAR) 10/04/2021 None  Detected  NONE DETECTED (Cut Off Level 300 ng/mL) Final   POC Buprenorphine (BUP) 10/04/2021 None Detected  NONE DETECTED (Cut Off Level 10 ng/mL) Final   POC Oxazepam (BZO) 10/04/2021 None Detected  NONE DETECTED (Cut Off Level 300 ng/mL) Final   POC Cocaine UR 10/04/2021 None Detected  NONE DETECTED (Cut Off Level 300 ng/mL) Final   POC Methamphetamine UR 10/04/2021 None Detected  NONE DETECTED (Cut Off Level 1000 ng/mL) Final   POC Morphine 10/04/2021 None Detected  NONE DETECTED (Cut Off Level 300 ng/mL) Final   POC Methadone UR 10/04/2021 None Detected  NONE DETECTED (Cut Off Level 300 ng/mL) Final   POC Oxycodone UR 10/04/2021 Positive (A)  NONE DETECTED (Cut Off Level 100 ng/mL) Final   POC Marijuana UR 10/04/2021 None Detected  NONE DETECTED (Cut Off Level 50 ng/mL) Final   SARSCOV2ONAVIRUS 2 AG 10/04/2021 NEGATIVE  NEGATIVE Final   Comment: (NOTE) SARS-CoV-2 antigen NOT DETECTED.   Negative results are presumptive.  Negative results do not preclude SARS-CoV-2 infection and should not be used as the sole basis for treatment or other patient management decisions, including infection  control decisions, particularly in the presence of clinical signs and  symptoms consistent with COVID-19, or in those who have been in contact with the virus.  Negative results must be combined with clinical observations, patient history, and epidemiological information. The expected result is Negative.  Fact Sheet for Patients: https://www.jennings-kim.com/  Fact Sheet for Healthcare Providers: https://alexander-rogers.biz/  This test is not yet approved or cleared by the [place] FDA and  has been authorized for detection and/or diagnosis of SARS-CoV-2 by FDA under an Emergency Use Authorization (EUA).  This EUA will  remain in effect (meaning this test can be used) for the duration of  the COV                          ID-19 declaration under Section 564(b)(1) of the  Act, 21 U.S.C. section 360bbb-3(b)(1), unless the authorization is terminated or revoked sooner.     Valproic Acid Lvl 10/04/2021 51  50.0 - 100.0 ug/mL Final   Performed at [facility] [address], [place], [place] [address]   Valproic Acid Lvl 10/08/2021 57  50.0 - 100.0 ug/mL Final   Performed at [facility] [address] [place], [place] [address]   Glucose-Capillary 10/09/2021 123 (H)  70 - 99 mg/dL Final   Glucose reference range applies only to samples taken after fasting for at least 8 hours.  Admission on 09/09/2021, Discharged on 09/10/2021  Component Date Value Ref Range Status   Sodium 09/09/2021 134 (L)  135 - 145 mmol/L Final   Potassium 09/09/2021 4.3  3.5 - 5.1 mmol/L Final   Chloride 09/09/2021 99  98 - 111 mmol/L Final   CO2 09/09/2021 25  22 - 32 mmol/L Final   Glucose, Bld 09/09/2021 107 (H)  70 - 99 mg/dL Final   Glucose reference range applies only to samples taken after fasting for at least 8 hours.   BUN 09/09/2021 12  6 - 20 mg/dL Final   Creatinine, Ser 09/09/2021 0.74  0.44 - 1.00 mg/dL Final   Calcium 09/09/2021 9.0  8.9 - 10.3 mg/dL Final   Total Protein 09/09/2021 7.4  6.5 - 8.1 g/dL Final   Albumin 09/09/2021 3.1 (L)  3.5 - 5.0 g/dL Final   AST 09/09/2021 13 (L)  15 - 41 U/L Final   ALT 09/09/2021 13  0 - 44 U/L Final   Alkaline Phosphatase 09/09/2021 66  38 - 126 U/L Final   Total Bilirubin 09/09/2021 0.2 (L)  0.3 - 1.2 mg/dL Final   GFR, Estimated 09/09/2021 >60  >60 mL/min Final   Comment: (NOTE) Calculated using the CKD-EPI Creatinine Equation (2021)    Anion gap 09/09/2021 10  5 - 15 Final   Performed at [facility], [place] [address], [place], [place] [address]   WBC 09/09/2021 8.5  4.0 - 10.5 K/uL Final   RBC 09/09/2021 4.53  3.87 - 5.11 MIL/uL Final   Hemoglobin 09/09/2021 11.8 (L)  12.0 - 15.0 g/dL Final   HCT 09/09/2021 38.0  36.0 - 46.0 % Final   MCV 09/09/2021 83.9  80.0 - 100.0 fL Final   MCH 09/09/2021 26.0   26.0 - 34.0 pg Final   MCHC 09/09/2021 31.1  30.0 - 36.0 g/dL Final   RDW 09/09/2021 17.8 (H)  11.5 - 15.5 % Final   Platelets 09/09/2021 442 (H)  150 - 400 K/uL Final   nRBC 09/09/2021 0.0  0.0 - 0.2 % Final   Neutrophils Relative % 09/09/2021 70  % Final   Neutro Abs 09/09/2021 5.9  1.7 - 7.7 K/uL Final   Lymphocytes Relative 09/09/2021 20  % Final   Lymphs Abs 09/09/2021 1.7  0.7 - 4.0 K/uL Final   Monocytes Relative 09/09/2021 8  % Final   Monocytes Absolute 09/09/2021 0.7  0.1 - 1.0 K/uL Final   Eosinophils Relative 09/09/2021 1  % Final   Eosinophils Absolute 09/09/2021 0.1  0.0 - 0.5 K/uL Final   Basophils Relative 09/09/2021 1  % Final  Basophils Absolute 09/09/2021 0.1  0.0 - 0.1 K/uL Final   Immature Granulocytes 09/09/2021 0  % Final   Abs Immature Granulocytes 09/09/2021 0.03  0.00 - 0.07 K/uL Final   Performed at [facility] [address] [place], [place] [address]   Ammonia 09/09/2021 37 (H)  9 - 35 umol/L Final   Comment: HEMOLYSIS AT THIS LEVEL MAY AFFECT RESULT Performed at [facility], [place] [address] [place], [place] [address]    Opiates 09/09/2021 NONE DETECTED  NONE DETECTED Final   Cocaine 09/09/2021 NONE DETECTED  NONE DETECTED Final   Benzodiazepines 09/09/2021 NONE DETECTED  NONE DETECTED Final   Amphetamines 09/09/2021 NONE DETECTED  NONE DETECTED Final   Tetrahydrocannabinol 09/09/2021 NONE DETECTED  NONE DETECTED Final   Barbiturates 09/09/2021 NONE DETECTED  NONE DETECTED Final   Comment: (NOTE) DRUG SCREEN FOR MEDICAL PURPOSES ONLY.  IF CONFIRMATION IS NEEDED FOR ANY PURPOSE, NOTIFY LAB WITHIN 5 DAYS.  LOWEST DETECTABLE LIMITS FOR URINE DRUG SCREEN Drug Class                     Cutoff (ng/mL) Amphetamine and metabolites    1000 Barbiturate and metabolites    200 Benzodiazepine                 [id number] Tricyclics and metabolites     300 Opiates and metabolites        300 Cocaine and metabolites        300 THC                             50 Performed at [facility], [place] [address], [place], [place] [address]    Alcohol, Ethyl (B) 09/09/2021 <10  <10 mg/dL Final   Comment: (NOTE) Lowest detectable limit for serum alcohol is 10 mg/dL.  For medical purposes only. Performed at [facility], [place] [address] [place], [place] [address]    Lipase 09/09/2021 33  11 - 51 U/L Final   Performed at [facility] [address] [place], [place] [address]   Color, Urine 09/09/2021 COLORLESS (A)  YELLOW Final   APPearance 09/09/2021 CLEAR  CLEAR Final   Specific Gravity, Urine 09/09/2021 1.002 (L)  1.005 - 1.030 Final   pH 09/09/2021 6.0  5.0 - 8.0 Final   Glucose, UA 09/09/2021 NEGATIVE  NEGATIVE mg/dL Final   Hgb urine dipstick 09/09/2021 NEGATIVE  NEGATIVE Final   Bilirubin Urine 09/09/2021 NEGATIVE  NEGATIVE Final   Ketones, ur 09/09/2021 NEGATIVE  NEGATIVE mg/dL Final   Protein, ur 09/09/2021 NEGATIVE  NEGATIVE mg/dL Final   Nitrite 09/09/2021 NEGATIVE  NEGATIVE Final   Leukocytes,Ua 09/09/2021 NEGATIVE  NEGATIVE Final   Performed at [facility] [address], [place], [place] [address]   Specimen Description 09/09/2021 URINE, CLEAN CATCH   Final   Special Requests 09/09/2021 NONE   Final   Culture 09/09/2021  (A)   Final                   Value:<10,000 COLONIES/mL INSIGNIFICANT GROWTH Performed at [facility], [place] [address], [place], [place] [address]    Report Status 09/09/2021 09/10/2021 FINAL   Final   D-Dimer, Quant 09/09/2021 0.92 (H)  0.00 - 0.50 ug/mL-FEU Final   Comment: (NOTE) At the manufacturer cut-off value of 0.5 g/mL FEU, this assay has a negative  predictive value of 95-100%.This assay is intended for use in conjunction with a clinical pretest probability (PTP) assessment model to exclude pulmonary embolism (PE) and deep venous thrombosis (DVT) in outpatients suspected of PE or DVT. Results should be correlated with clinical presentation. Performed at [facility], [place] [address] [place], [place] [address]    Troponin I (High Sensitivity) 09/09/2021 5  <18 ng/L Final   Comment: (NOTE) Elevated high sensitivity troponin I (hsTnI) values and significant  changes across serial measurements may suggest ACS but many other  chronic and acute conditions are known to elevate hsTnI results.  Refer to the "Links" section for chest pain algorithms and additional  guidance. Performed at [facility], [place] [address] [place], [place] [address]    BP 09/10/2021 135/83  mmHg Final   S' Lateral 09/10/2021 2.30  cm Final   Area-P 1/2 09/10/2021 5.58  cm2 Final    Blood Alcohol level:  Lab Results  Component Value Date   ETH <10 09/09/2021   ETH <10 [id number]    Metabolic Disorder Labs: Lab Results  Component Value Date   HGBA1C 7.0 (H) 10/04/2021   MPG 154.2 10/04/2021   No results found for: "PROLACTIN" Lab Results  Component Value Date   CHOL 178 10/04/2021   TRIG 155 (H) 10/04/2021   HDL 52 10/04/2021   CHOLHDL 3.4 10/04/2021   VLDL 31 10/04/2021   [place] 95 10/04/2021    Therapeutic Lab Levels: No results found for: "LITHIUM" Lab Results  Component Value Date   VALPROATE 57 10/08/2021   VALPROATE 51 10/04/2021   No results found for: "CBMZ"  Physical Findings   Flowsheet Row ED from 10/04/2021 in [facility] ED from 09/09/2021 in [facility] from 08/22/2021 in [facility]  [place] No Risk No Risk No Risk        Musculoskeletal  Strength & Muscle Tone: within normal limits Gait & Station: normal Patient leans: N/A  Psychiatric Specialty Exam  Presentation  General Appearance: Appropriate for Environment  Eye Contact:Good  Speech:Clear and Coherent  Speech Volume:Normal  Handedness:Right   Mood and Affect  Mood:Euthymic  Affect:Constricted   Thought Process  Thought Processes:Coherent;  Linear  Descriptions of Associations:Intact  Orientation:Full (Time, Place and Person)  Thought Content:Logical  Diagnosis of Schizophrenia or Schizoaffective disorder in past: Yes  Duration of Psychotic Symptoms: Greater than six months   Hallucinations:Hallucinations: None  Ideas of Reference:None  Suicidal Thoughts:Suicidal Thoughts: No  Homicidal Thoughts:Homicidal Thoughts: No   Sensorium  Memory:Immediate Fair; Recent Fair; Remote Fair  Judgment:Poor  Insight:Poor   Executive Functions  Concentration:Poor  Attention Span:Poor  Recall:Poor  Fund of Knowledge:Poor  Language:Poor   Psychomotor Activity  Psychomotor Activity:Psychomotor Activity: Normal   Assets  Assets:Communication Skills; Desire for Improvement   Sleep  Sleep:Sleep: Fair   Physical Exam  Physical Exam Constitutional:      Appearance: the patient is not toxic-appearing.  Pulmonary:     Effort: Pulmonary effort is normal.  Neurological:     General: No focal deficit present.     Mental Status: the patient is alert and oriented to person, place, and time.   Review of Systems  Respiratory:  Negative for shortness of breath.   Cardiovascular:  Negative for chest pain.  Gastrointestinal:  Negative for abdominal pain, constipation, diarrhea, nausea and vomiting.  Neurological:  Negative for headaches.   Blood pressure 108/78, pulse Marland Kitchen)  110, temperature 98 F (36.7 C), temperature source Oral, resp. rate 18, SpO2 98 %. There is no height or weight on file to calculate BMI.  Treatment Plan Summary: Daily contact with patient to assess and evaluate symptoms and progress in treatment and Medication management  Status: Voluntary Disposition: TBD, difficult to place given ID diagnosis and schizophrenia with aggression -LCSW working on disposition planning  Hx of schizophrenia and aggression Continue home medications as listed - Discontinued Haldol 10 mg 3 times daily because this  is not currently being prescribed by her outpatient provider -- Continue Zyprexa 20 mg QHS (in place of Lybalvi) - Continue Cogentin 1 mg twice daily - Continue Clozapine 50 mg twice daily, ACTT confirms patient is prescribed this medicine - Ingrezza 40 mg daily - Depakote extended release 500 mg twice daily    Labs: VPA trough of 51 8/6, 57 on 8/10 UDS with oxy A1C of 7 Hgb of 11.6 MCV of 80, will need outpatient follow up   Carlyn Reichert, MD 10/12/2021 4:27 PM

## 2021-10-12 NOTE — ED Notes (Signed)
Pt sitting in flex calm and cooperative. No c/o pain or stress. Will continue to monitor for safety

## 2021-10-12 NOTE — ED Notes (Signed)
Patient continues to express that she is in pain. Pain was given assistance aeb putting note in chat for pat and encouraging patient to stay calm. Patient is safe on unit with continued monitoring.

## 2021-10-12 NOTE — ED Notes (Signed)
Pt c/o right leg pain provider notified.  She refused prn medication at this time.

## 2021-10-12 NOTE — ED Notes (Signed)
Pt laying down quietly after eating.  No distress noted.

## 2021-10-12 NOTE — ED Notes (Signed)
Patient sleeping without sxs of distress - will continue to monitor for safety

## 2021-10-12 NOTE — BH Assessment (Signed)
LCSW Progress Note   1028 - LCSW began making phone calls to pt's care coordinator, Ms. Mariea Clonts, [phone] to get an update on disposition.    1236 - LCSW contacted Ms Chilton Si again to learn that she is still in contact with pt's previous placement who is hesitant in allowing the pt back in the home without additional community supports.    1316 - LCSW called [facility] to inquire about bed availability at the [facility], [place], [phone].  A voicemail was left requesting a call back.    1342 - LCSW returned missed call to Donney Rankins, [facility] intake for Transitions to [facility] regarding the pt's eligibility for the program to gain temporary housing.  Pt was deemed ineligible and would not be considered.  An email with a letter noting the final decision forwarded to Ms. Green.  LCSW continuously spoke with Ms. Green throughout the afternoon to share information regarding leads on placements.  LCSW sent email to Ms. Remonia Richter regarding getting [facility] involved.   Teresa Murillo, MSW, LCSW [facility] [phone] phone

## 2021-10-12 NOTE — ED Notes (Signed)
Pt sleeping in adult obs. Respirations even and unlabored with no signs of acute distress noted. Will continue to monitor for safety.

## 2021-10-12 NOTE — ED Notes (Signed)
Pt continues to be complaining of Rt leg pain. She was given PRN medications for pain and anxiety.  Provider aware of her desire to speak with her.

## 2021-10-12 NOTE — ED Notes (Signed)
Pt asleep at this hour. No apparent distress. RR even and unlabored. Monitored for safety.  

## 2021-10-12 NOTE — ED Notes (Signed)
Pt was given a salad for lunch 

## 2021-10-13 ENCOUNTER — Ambulatory Visit: Payer: Medicaid Other | Admitting: Internal Medicine

## 2021-10-13 NOTE — ED Notes (Signed)
Patient given breakfast. 

## 2021-10-13 NOTE — ED Notes (Signed)
Patient complains of increase anxiety and restlessness. Patient medicated per PRN MAR orders. Respirations equal and unlabored, skin warm and dry. No acute distress noted. Safety protocol remain in place.

## 2021-10-13 NOTE — ED Notes (Signed)
Patient resting quietly in bed watching TV. Respirations equal and unlabored, skin warm and dry, NAD. No change in assessment or acuity. Routine safety checks conducted according to facility protocol. Will continue to monitor for safety.   

## 2021-10-13 NOTE — ED Notes (Signed)
Pt is sleeping with no signs of distress or difficulty respirations are easy.

## 2021-10-13 NOTE — ED Notes (Signed)
Patient giving second cup of coffee. Respirations equal and unlabored, skin warm and dry, NAD. No change in assessment or acuity. Routine safety checks conducted according to facility protocol. Will continue to monitor for safety.

## 2021-10-13 NOTE — BH Assessment (Signed)
LCSW Progress Note   0830 - LCSW contacted Ms. Green for updated information regarding disposition.  LCSW was informed that pt's previous AFL contacted [facility] to gather information regarding what would be required of them if they decide to allow the pt to come back temporarily until a new placement could be found for her.  Ms. Chilton Si stated she would start contacting other AFL's.  A call was made by Loraine Leriche, LCSW, earlier this morning but a voicemail was left requesting a call back.    Hansel Starling, MSW, LCSW [facility] [phone] phone

## 2021-10-13 NOTE — ED Notes (Signed)
Remains asleep no distress noted respirations remain easy skin color appropriate for ethnicity.

## 2021-10-13 NOTE — ED Notes (Signed)
Pt was given a muffin and cereal for breakfast. ?

## 2021-10-13 NOTE — ED Notes (Signed)
Pt was given a sub, chips, and juice for lunch.  

## 2021-10-13 NOTE — ED Notes (Signed)
Patient A&Ox4. Denies intent to harm self/others when asked. Denies A/VH. Patient denies any physical complaints when asked. No acute distress noted. Support and encouragement provided. Routine safety checks conducted according to facility protocol. Encouraged patient to notify staff if thoughts of harm toward self or others arise. Patient verbalize understanding and agreement. Will continue to monitor for safety.    

## 2021-10-13 NOTE — ED Notes (Signed)
Pt sleeping@this time. Breathing even and unlabored. Will continue to monitor for safety 

## 2021-10-13 NOTE — ED Provider Notes (Signed)
Behavioral Health Progress Note  Date and Time: 10/13/2021 11:05 AM Name: Teresa Murillo MRN:  [id number]  Subjective:   Teresa Murillo is a 61 year old female with a past psychiatric history of schizophrenia, aggressive behavior, and possible intellectual disability.  She presented to the [facility] behavioral health urgent care after an argument with staff members at her group home that resulted in her signing discharge paperwork and leaving.  Per previous documentation, the group home staff members have reported that the patient has a history of intellectual disability.  Cannot find this documented elsewhere in the chart.  It is certainly possible, as the patient is unable to state any of her home medications and is unable to perform simple math problems.  The patient reports that she receives a disability check each month. The patient is connected with [facility] but does not receive ACT services.  The patient has a care coordinator with [facility] named Ms. Green, with whom the LCSW has frequently been communicating with to determine proper disposition for the patient.   On assessment this morning, the patient is calm and cooperative.  Per nursing documentation the patient has also been calm and cooperative.  Presently she does not appear to be psychotic and she does not admit to any auditory or visual hallucinations.  She denies suicidal or homicidal thoughts.  She reports appropriate mood, sleep, and appetite.   LCSW has discussed with patient's care coordinator, Ms. Green, and she is currently looking for assisted living facilities for the patient to go to.  LCSW waiting for return call from [facility].    Diagnosis:  Final diagnoses:  Schizophrenia, unspecified type (HCC)    Total Time spent with patient: 15 minutes  Past Psychiatric History: as above Past Medical History:  Past Medical History:  Diagnosis Date   Chronic obstructive pulmonary disease (COPD) (HCC)     Glaucoma    Hyperlipidemia    Hypertension    Iron deficiency    Schizoaffective disorder (HCC)    Type 2 diabetes mellitus (HCC)     Past Surgical History:  Procedure Laterality Date   TUBAL LIGATION     Family History:  Family History  Problem Relation Age of Onset   Breast cancer Maternal Grandmother    Family Psychiatric  History: as per H and P Social History:  Social History   Substance and Sexual Activity  Alcohol Use Yes     Social History   Substance and Sexual Activity  Drug Use Not Currently    Social History   Socioeconomic History   Marital status: Divorced    Spouse name: Not on file   Number of children: Not on file   Years of education: Not on file   Highest education level: Not on file  Occupational History   Not on file  Tobacco Use   Smoking status: Every Day    Packs/day: 1.00    Types: Cigars, Cigarettes   Smokeless tobacco: Current  Vaping Use   Vaping Use: Never used  Substance and Sexual Activity   Alcohol use: Yes   Drug use: Not Currently   Sexual activity: Not Currently    Birth control/protection: Surgical  Other Topics Concern   Not on file  Social History Narrative   Not on file   Social Determinants of Health   Financial Resource Strain: Not on file  Food Insecurity: Not on file  Transportation Needs: Not on file  Physical Activity: Not on file  Stress: Not  on file  Social Connections: Not on file   SDOH:  SDOH Screenings   Alcohol Screen: Not on file  Depression (PHQ2-9): Not on file  Financial Resource Strain: Not on file  Food Insecurity: Not on file  Housing: Not on file  Physical Activity: Not on file  Social Connections: Not on file  Stress: Not on file  Tobacco Use: High Risk (09/09/2021)   Patient History    Smoking Tobacco Use: Every Day    Smokeless Tobacco Use: Current    Passive Exposure: Not on file  Transportation Needs: Not on file   Additional Social History:    Pain Medications: See  MAR Prescriptions: See MAR Over the Counter: See MAR History of alcohol / drug use?: No history of alcohol / drug abuse Longest period of sobriety (when/how long): N/A Negative Consequences of Use:  (N/A) Withdrawal Symptoms:  (N/A)                    Sleep: Fair  Appetite:  Fair  Current Medications:  Current Facility-Administered Medications  Medication Dose Route Frequency Provider Last Rate Last Admin   acetaminophen (TYLENOL) tablet 650 mg  650 mg Oral Q6H PRN Lauree Chandler, NP   650 mg at 10/12/21 1015   alum & mag hydroxide-simeth (MAALOX/MYLANTA) 200-200-20 MG/5ML suspension 30 mL  30 mL Oral Q4H PRN Lauree Chandler, NP       benztropine (COGENTIN) tablet 1 mg  1 mg Oral BID Lauree Chandler, NP   1 mg at 10/13/21 0912   cloZAPine (CLOZARIL) tablet 50 mg  50 mg Oral BID Lauree Chandler, NP   50 mg at 10/13/21 0912   divalproex (DEPAKOTE ER) 24 hr tablet 500 mg  500 mg Oral BID Lauree Chandler, NP   500 mg at 10/13/21 0912   hydrOXYzine (ATARAX) tablet 50 mg  50 mg Oral TID PRN Carlyn Reichert, MD   50 mg at 10/12/21 1043   latanoprost (XALATAN) 0.005 % ophthalmic solution 1 drop  1 drop Both Eyes QHS Lauree Chandler, NP   1 drop at 10/12/21 2221   lisinopril (ZESTRIL) tablet 10 mg  10 mg Oral Daily Lauree Chandler, NP   10 mg at 10/13/21 0912   magnesium hydroxide (MILK OF MAGNESIA) suspension 30 mL  30 mL Oral Daily PRN Lauree Chandler, NP       metFORMIN (GLUCOPHAGE) tablet 500 mg  500 mg Oral BID WC Lauree Chandler, NP   500 mg at 10/13/21 0836   mometasone-formoterol (DULERA) 200-5 MCG/ACT inhaler 2 puff  2 puff Inhalation BID Lauree Chandler, NP   2 puff at 10/13/21 0836   nicotine (NICODERM CQ - dosed in mg/24 hr) patch 7 mg  7 mg Transdermal Daily Lauree Chandler, NP   7 mg at 10/13/21 0914   OLANZapine (ZYPREXA) tablet 20 mg  20 mg Oral QHS Carlyn Reichert, MD   20 mg at 10/12/21 2127   valbenazine (INGREZZA) capsule 40  mg  40 mg Oral q AM Lauree Chandler, NP   40 mg at 10/13/21 9485   Current Outpatient Medications  Medication Sig Dispense Refill   benztropine (COGENTIN) 1 MG tablet Take 1 mg by mouth in the morning and at bedtime.     budesonide-formoterol (SYMBICORT) 160-4.5 MCG/ACT inhaler Inhale 2 puffs into the lungs in the morning and at bedtime.     Cholecalciferol (VITAMIN D3) 1.25 MG (50000 UT) CAPS Take  50,000 Units by mouth every Thursday.     clozapine (CLOZARIL) 50 MG tablet Take 50 mg by mouth 2 (two) times daily.     divalproex (DEPAKOTE ER) 500 MG 24 hr tablet Take 500 mg by mouth in the morning and at bedtime.     haloperidol (HALDOL) 10 MG tablet Take 10 mg by mouth 3 (three) times daily.     INGREZZA 40 MG capsule Take 40 mg by mouth in the morning.     latanoprost (XALATAN) 0.005 % ophthalmic solution Place 1 drop into both eyes at bedtime.     lisinopril (ZESTRIL) 10 MG tablet Take 10 mg by mouth daily.     metFORMIN (GLUCOPHAGE) 500 MG tablet Take 500 mg by mouth 2 (two) times daily with a meal.     nicotine (NICODERM CQ - DOSED IN MG/24 HR) 7 mg/24hr patch Place 7 mg onto the skin daily.     OLANZapine-Samidorphan (LYBALVI) 20-10 MG TABS Take 1 tablet by mouth at bedtime.     PROAIR HFA 108 (90 Base) MCG/ACT inhaler Inhale 2 puffs into the lungs every 6 (six) hours as needed for wheezing or shortness of breath.     zolpidem (AMBIEN) 10 MG tablet Take 10 mg by mouth at bedtime.      Labs  Lab Results:  Admission on 10/04/2021  Component Date Value Ref Range Status   SARS Coronavirus 2 by RT PCR 10/04/2021 NEGATIVE  NEGATIVE Final   Comment: (NOTE) SARS-CoV-2 target nucleic acids are NOT DETECTED.  The SARS-CoV-2 RNA is generally detectable in upper respiratory specimens during the acute phase of infection. The lowest concentration of SARS-CoV-2 viral copies this assay can detect is 138 copies/mL. A negative result does not preclude SARS-Cov-2 infection and should not be  used as the sole basis for treatment or other patient management decisions. A negative result may occur with  improper specimen collection/handling, submission of specimen other than nasopharyngeal swab, presence of viral mutation(s) within the areas targeted by this assay, and inadequate number of viral copies(<138 copies/mL). A negative result must be combined with clinical observations, patient history, and epidemiological information. The expected result is Negative.  Fact Sheet for Patients:  BloggerCourse.com  Fact Sheet for Healthcare Providers:  SeriousBroker.it  This test is no                          t yet approved or cleared by the [place] FDA and  has been authorized for detection and/or diagnosis of SARS-CoV-2 by FDA under an Emergency Use Authorization (EUA). This EUA will remain  in effect (meaning this test can be used) for the duration of the COVID-19 declaration under Section 564(b)(1) of the Act, 21 U.S.C.section 360bbb-3(b)(1), unless the authorization is terminated  or revoked sooner.       Influenza A by PCR 10/04/2021 NEGATIVE  NEGATIVE Final   Influenza B by PCR 10/04/2021 NEGATIVE  NEGATIVE Final   Comment: (NOTE) The Xpert Xpress SARS-CoV-2/FLU/RSV plus assay is intended as an aid in the diagnosis of influenza from Nasopharyngeal swab specimens and should not be used as a sole basis for treatment. Nasal washings and aspirates are unacceptable for Xpert Xpress SARS-CoV-2/FLU/RSV testing.  Fact Sheet for Patients: BloggerCourse.com  Fact Sheet for Healthcare Providers: SeriousBroker.it  This test is not yet approved or cleared by the [place] FDA and has been authorized for detection and/or diagnosis of SARS-CoV-2 by FDA under an Emergency Use Authorization (  EUA). This EUA will remain in effect (meaning this test can be used) for the  duration of the COVID-19 declaration under Section 564(b)(1) of the Act, 21 U.S.C. section 360bbb-3(b)(1), unless the authorization is terminated or revoked.  Performed at [facility], [address], [place], [place] [address]    WBC 10/04/2021 8.4  4.0 - 10.5 K/uL Final   RBC 10/04/2021 4.42  3.87 - 5.11 MIL/uL Final   Hemoglobin 10/04/2021 11.6 (L)  12.0 - 15.0 g/dL Final   HCT 28/00/3491 35.7 (L)  36.0 - 46.0 % Final   MCV 10/04/2021 80.8  80.0 - 100.0 fL Final   MCH 10/04/2021 26.2  26.0 - 34.0 pg Final   MCHC 10/04/2021 32.5  30.0 - 36.0 g/dL Final   RDW 79/15/0569 16.0 (H)  11.5 - 15.5 % Final   Platelets 10/04/2021 372  150 - 400 K/uL Final   nRBC 10/04/2021 0.0  0.0 - 0.2 % Final   Neutrophils Relative % 10/04/2021 68  % Final   Neutro Abs 10/04/2021 5.7  1.7 - 7.7 K/uL Final   Lymphocytes Relative 10/04/2021 22  % Final   Lymphs Abs 10/04/2021 1.8  0.7 - 4.0 K/uL Final   Monocytes Relative 10/04/2021 8  % Final   Monocytes Absolute 10/04/2021 0.7  0.1 - 1.0 K/uL Final   Eosinophils Relative 10/04/2021 1  % Final   Eosinophils Absolute 10/04/2021 0.1  0.0 - 0.5 K/uL Final   Basophils Relative 10/04/2021 1  % Final   Basophils Absolute 10/04/2021 0.1  0.0 - 0.1 K/uL Final   Immature Granulocytes 10/04/2021 0  % Final   Abs Immature Granulocytes 10/04/2021 0.03  0.00 - 0.07 K/uL Final   Performed at [facility], [address] [place], [place] [address]   Sodium 10/04/2021 136  135 - 145 mmol/L Final   Potassium 10/04/2021 4.2  3.5 - 5.1 mmol/L Final   Chloride 10/04/2021 104  98 - 111 mmol/L Final   CO2 10/04/2021 25  22 - 32 mmol/L Final   Glucose, Bld 10/04/2021 100 (H)  70 - 99 mg/dL Final   Glucose reference range applies only to samples taken after fasting for at least 8 hours.   BUN 10/04/2021 9  6 - 20 mg/dL Final   Creatinine, Ser 10/04/2021 0.52  0.44 - 1.00 mg/dL Final   Calcium 16/55/3748 9.0  8.9 - 10.3 mg/dL Final   Total Protein 27/08/8673  7.0  6.5 - 8.1 g/dL Final   Albumin 44/92/0100 3.2 (L)  3.5 - 5.0 g/dL Final   AST 71/21/9758 13 (L)  15 - 41 U/L Final   ALT 10/04/2021 10  0 - 44 U/L Final   Alkaline Phosphatase 10/04/2021 61  38 - 126 U/L Final   Total Bilirubin 10/04/2021 0.3  0.3 - 1.2 mg/dL Final   GFR, Estimated 10/04/2021 >60  >60 mL/min Final   Comment: (NOTE) Calculated using the CKD-EPI Creatinine Equation (2021)    Anion gap 10/04/2021 7  5 - 15 Final   Performed at [facility], [address] [place], [place] [address]   Hgb A1c MFr Bld 10/04/2021 7.0 (H)  4.8 - 5.6 % Final   Comment: (NOTE) Pre diabetes:          5.7%-6.4%  Diabetes:              >6.4%  Glycemic control for   <7.0% adults with diabetes    Mean Plasma Glucose  10/04/2021 154.2  mg/dL Final   Performed at [facility], [address], [place], [place] [address]   Cholesterol 10/04/2021 178  0 - 200 mg/dL Final   Triglycerides 16/60/6301 155 (H)  <150 mg/dL Final   HDL 60/11/9321 52  >40 mg/dL Final   Total CHOL/HDL Ratio 10/04/2021 3.4  RATIO Final   VLDL 10/04/2021 31  0 - 40 mg/dL Final   LDL Cholesterol 10/04/2021 95  0 - 99 mg/dL Final   Comment:        Total Cholesterol/HDL:CHD Risk Coronary Heart Disease Risk Table                     Men   Women  1/2 Average Risk   3.4   3.3  Average Risk       5.0   4.4  2 X Average Risk   9.6   7.1  3 X Average Risk  23.4   11.0        Use the calculated Patient Ratio above and the CHD Risk Table to determine the patient's CHD Risk.        ATP III CLASSIFICATION (LDL):  <100     mg/dL   Optimal  557-322  mg/dL   Near or Above                    Optimal  130-159  mg/dL   Borderline  025-427  mg/dL   High  >062     mg/dL   Very High Performed at [facility], [address] [place], [place] [address]    POC Amphetamine UR 10/04/2021 None Detected  NONE DETECTED (Cut Off Level 1000 ng/mL) Final   POC Secobarbital (BAR) 10/04/2021 None Detected  NONE DETECTED  (Cut Off Level 300 ng/mL) Final   POC Buprenorphine (BUP) 10/04/2021 None Detected  NONE DETECTED (Cut Off Level 10 ng/mL) Final   POC Oxazepam (BZO) 10/04/2021 None Detected  NONE DETECTED (Cut Off Level 300 ng/mL) Final   POC Cocaine UR 10/04/2021 None Detected  NONE DETECTED (Cut Off Level 300 ng/mL) Final   POC Methamphetamine UR 10/04/2021 None Detected  NONE DETECTED (Cut Off Level 1000 ng/mL) Final   POC Morphine 10/04/2021 None Detected  NONE DETECTED (Cut Off Level 300 ng/mL) Final   POC Methadone UR 10/04/2021 None Detected  NONE DETECTED (Cut Off Level 300 ng/mL) Final   POC Oxycodone UR 10/04/2021 Positive (A)  NONE DETECTED (Cut Off Level 100 ng/mL) Final   POC Marijuana UR 10/04/2021 None Detected  NONE DETECTED (Cut Off Level 50 ng/mL) Final   SARSCOV2ONAVIRUS 2 AG 10/04/2021 NEGATIVE  NEGATIVE Final   Comment: (NOTE) SARS-CoV-2 antigen NOT DETECTED.   Negative results are presumptive.  Negative results do not preclude SARS-CoV-2 infection and should not be used as the sole basis for treatment or other patient management decisions, including infection  control decisions, particularly in the presence of clinical signs and  symptoms consistent with COVID-19, or in those who have been in contact with the virus.  Negative results must be combined with clinical observations, patient history, and epidemiological information. The expected result is Negative.  Fact Sheet for Patients: https://www.jennings-kim.com/  Fact Sheet for Healthcare Providers: https://alexander-rogers.biz/  This test is not yet approved or cleared by the [place] FDA and  has been authorized for detection and/or diagnosis of SARS-CoV-2 by FDA under an Emergency Use Authorization (EUA).  This EUA will remain in  effect (meaning this test can be used) for the duration of  the COV                          ID-19 declaration under Section 564(b)(1) of the Act, 21 U.S.C. section  360bbb-3(b)(1), unless the authorization is terminated or revoked sooner.     Valproic Acid Lvl 10/04/2021 51  50.0 - 100.0 ug/mL Final   Performed at [facility], [address] [place], [place] [address]   Valproic Acid Lvl 10/08/2021 57  50.0 - 100.0 ug/mL Final   Performed at [facility], [address] [place], [place] [address]   Glucose-Capillary 10/09/2021 123 (H)  70 - 99 mg/dL Final   Glucose reference range applies only to samples taken after fasting for at least 8 hours.  Admission on 09/09/2021, Discharged on 09/10/2021  Component Date Value Ref Range Status   Sodium 09/09/2021 134 (L)  135 - 145 mmol/L Final   Potassium 09/09/2021 4.3  3.5 - 5.1 mmol/L Final   Chloride 09/09/2021 99  98 - 111 mmol/L Final   CO2 09/09/2021 25  22 - 32 mmol/L Final   Glucose, Bld 09/09/2021 107 (H)  70 - 99 mg/dL Final   Glucose reference range applies only to samples taken after fasting for at least 8 hours.   BUN 09/09/2021 12  6 - 20 mg/dL Final   Creatinine, Ser 09/09/2021 0.74  0.44 - 1.00 mg/dL Final   Calcium 09/81/191407/01/2022 9.0  8.9 - 10.3 mg/dL Final   Total Protein 78/29/562107/01/2022 7.4  6.5 - 8.1 g/dL Final   Albumin 30/86/578407/01/2022 3.1 (L)  3.5 - 5.0 g/dL Final   AST 69/62/952807/01/2022 13 (L)  15 - 41 U/L Final   ALT 09/09/2021 13  0 - 44 U/L Final   Alkaline Phosphatase 09/09/2021 66  38 - 126 U/L Final   Total Bilirubin 09/09/2021 0.2 (L)  0.3 - 1.2 mg/dL Final   GFR, Estimated 09/09/2021 >60  >60 mL/min Final   Comment: (NOTE) Calculated using the CKD-EPI Creatinine Equation (2021)    Anion gap 09/09/2021 10  5 - 15 Final   Performed at [facility], [address] [place], [place] [address]   WBC 09/09/2021 8.5  4.0 - 10.5 K/uL Final   RBC 09/09/2021 4.53  3.87 - 5.11 MIL/uL Final   Hemoglobin 09/09/2021 11.8 (L)  12.0 - 15.0 g/dL Final   HCT 40/10/272507/01/2022 38.0  36.0 - 46.0 % Final   MCV 09/09/2021 83.9  80.0 - 100.0 fL Final   MCH 09/09/2021 26.0  26.0 - 34.0 pg Final    MCHC 09/09/2021 31.1  30.0 - 36.0 g/dL Final   RDW 36/64/403407/01/2022 17.8 (H)  11.5 - 15.5 % Final   Platelets 09/09/2021 442 (H)  150 - 400 K/uL Final   nRBC 09/09/2021 0.0  0.0 - 0.2 % Final   Neutrophils Relative % 09/09/2021 70  % Final   Neutro Abs 09/09/2021 5.9  1.7 - 7.7 K/uL Final   Lymphocytes Relative 09/09/2021 20  % Final   Lymphs Abs 09/09/2021 1.7  0.7 - 4.0 K/uL Final   Monocytes Relative 09/09/2021 8  % Final   Monocytes Absolute 09/09/2021 0.7  0.1 - 1.0 K/uL Final   Eosinophils Relative 09/09/2021 1  % Final   Eosinophils Absolute 09/09/2021 0.1  0.0 - 0.5 K/uL Final   Basophils Relative 09/09/2021 1  % Final  Basophils Absolute 09/09/2021 0.1  0.0 - 0.1 K/uL Final   Immature Granulocytes 09/09/2021 0  % Final   Abs Immature Granulocytes 09/09/2021 0.03  0.00 - 0.07 K/uL Final   Performed at [facility], [address] [place], [place] [address]   Ammonia 09/09/2021 37 (H)  9 - 35 umol/L Final   Comment: HEMOLYSIS AT THIS LEVEL MAY AFFECT RESULT Performed at [facility], [address], [place], [place] [address]    Opiates 09/09/2021 NONE DETECTED  NONE DETECTED Final   Cocaine 09/09/2021 NONE DETECTED  NONE DETECTED Final   Benzodiazepines 09/09/2021 NONE DETECTED  NONE DETECTED Final   Amphetamines 09/09/2021 NONE DETECTED  NONE DETECTED Final   Tetrahydrocannabinol 09/09/2021 NONE DETECTED  NONE DETECTED Final   Barbiturates 09/09/2021 NONE DETECTED  NONE DETECTED Final   Comment: (NOTE) DRUG SCREEN FOR MEDICAL PURPOSES ONLY.  IF CONFIRMATION IS NEEDED FOR ANY PURPOSE, NOTIFY LAB WITHIN 5 DAYS.  LOWEST DETECTABLE LIMITS FOR URINE DRUG SCREEN Drug Class                     Cutoff (ng/mL) Amphetamine and metabolites    1000 Barbiturate and metabolites    200 Benzodiazepine                 200 Tricyclics and metabolites     300 Opiates and metabolites        300 Cocaine and metabolites        300 THC                            50 Performed at  [facility], [address], [place], [place] [address]    Alcohol, Ethyl (B) 09/09/2021 <10  <10 mg/dL Final   Comment: (NOTE) Lowest detectable limit for serum alcohol is 10 mg/dL.  For medical purposes only. Performed at [facility], [address] [place], [place] [address]    Lipase 09/09/2021 33  11 - 51 U/L Final   Performed at [facility], [address], [place], [place] [address]   Color, Urine 09/09/2021 COLORLESS (A)  YELLOW Final   APPearance 09/09/2021 CLEAR  CLEAR Final   Specific Gravity, Urine 09/09/2021 1.002 (L)  1.005 - 1.030 Final   pH 09/09/2021 6.0  5.0 - 8.0 Final   Glucose, UA 09/09/2021 NEGATIVE  NEGATIVE mg/dL Final   Hgb urine dipstick 09/09/2021 NEGATIVE  NEGATIVE Final   Bilirubin Urine 09/09/2021 NEGATIVE  NEGATIVE Final   Ketones, ur 09/09/2021 NEGATIVE  NEGATIVE mg/dL Final   Protein, ur 13/09/6576 NEGATIVE  NEGATIVE mg/dL Final   Nitrite 46/96/2952 NEGATIVE  NEGATIVE Final   Leukocytes,Ua 09/09/2021 NEGATIVE  NEGATIVE Final   Performed at [facility], [address] [place], [place] [address]   Specimen Description 09/09/2021 URINE, CLEAN CATCH   Final   Special Requests 09/09/2021 NONE   Final   Culture 09/09/2021  (A)   Final                   Value:<10,000 COLONIES/mL INSIGNIFICANT GROWTH Performed at [facility], [address], [place], [place] [address]    Report Status 09/09/2021 09/10/2021 FINAL   Final   D-Dimer, Quant 09/09/2021 0.92 (H)  0.00 - 0.50 ug/mL-FEU Final   Comment: (NOTE) At the manufacturer cut-off value of 0.5 g/mL FEU, this assay has a negative  predictive value of 95-100%.This assay is intended for use in conjunction with a clinical pretest probability (PTP) assessment model to exclude pulmonary embolism (PE) and deep venous thrombosis (DVT) in outpatients suspected of PE or DVT. Results should be correlated with clinical presentation. Performed at [facility], [address], [place], [place] [address]    Troponin I (High Sensitivity) 09/09/2021 5  <18 ng/L Final   Comment: (NOTE) Elevated high sensitivity troponin I (hsTnI) values and significant  changes across serial measurements may suggest ACS but many other  chronic and acute conditions are known to elevate hsTnI results.  Refer to the "Links" section for chest pain algorithms and additional  guidance. Performed at [facility], [address], [place], [place] [address]    BP 09/10/2021 135/83  mmHg Final   S' Lateral 09/10/2021 2.30  cm Final   Area-P 1/2 09/10/2021 5.58  cm2 Final    Blood Alcohol level:  Lab Results  Component Value Date   ETH <10 09/09/2021   ETH <10 10/20/2020    Metabolic Disorder Labs: Lab Results  Component Value Date   HGBA1C 7.0 (H) 10/04/2021   MPG 154.2 10/04/2021   No results found for: "PROLACTIN" Lab Results  Component Value Date   CHOL 178 10/04/2021   TRIG 155 (H) 10/04/2021   HDL 52 10/04/2021   CHOLHDL 3.4 10/04/2021   VLDL 31 10/04/2021   LDLCALC 95 10/04/2021    Therapeutic Lab Levels: No results found for: "LITHIUM" Lab Results  Component Value Date   VALPROATE 57 10/08/2021   VALPROATE 51 10/04/2021   No results found for: "CBMZ"  Physical Findings   Flowsheet Row ED from 10/04/2021 in [facility] ED from 09/09/2021 in [facility] EMERGENCY DEPARTMENT ED from 08/22/2021 in [facility] Dept  C-SSRS RISK CATEGORY No Risk No Risk No Risk        Musculoskeletal  Strength & Muscle Tone: within normal limits Gait & Station: normal Patient leans: N/A  Psychiatric Specialty Exam  Presentation  General Appearance: Appropriate for Environment  Eye Contact:Good  Speech:Clear and Coherent  Speech Volume:Normal  Handedness:Right   Mood and Affect  Mood:Euthymic  Affect:Constricted   Thought Process  Thought Processes:Coherent; Linear  Descriptions  of Associations:Intact  Orientation:Full (Time, Place and Person)  Thought Content:Logical  Diagnosis of Schizophrenia or Schizoaffective disorder in past: Yes  Duration of Psychotic Symptoms: Greater than six months   Hallucinations:Hallucinations: None  Ideas of Reference:None  Suicidal Thoughts:Suicidal Thoughts: No  Homicidal Thoughts:Homicidal Thoughts: No   Sensorium  Memory:Immediate Fair; Recent Fair; Remote Fair  Judgment:Poor  Insight:Poor   Executive Functions  Concentration:Poor  Attention Span:Poor  Recall:Poor  Fund of Knowledge:Poor  Language:Poor   Psychomotor Activity  Psychomotor Activity:Psychomotor Activity: Normal   Assets  Assets:Communication Skills; Desire for Improvement   Sleep  Sleep:Sleep: Fair   Physical Exam  Physical Exam Constitutional:      Appearance: the patient is not toxic-appearing.  Pulmonary:     Effort: Pulmonary effort is normal.  Neurological:     General: No focal deficit present.     Mental Status: the patient is alert and oriented to person, place, and time.   Review of Systems  Respiratory:  Negative for shortness of breath.   Cardiovascular:  Negative for chest pain.  Gastrointestinal:  Negative for abdominal pain, constipation, diarrhea, nausea and vomiting.  Neurological:  Negative for headaches.   Blood pressure 119/81, pulse  86, temperature 98.9 F (37.2 C), temperature source Oral, resp. rate 20, SpO2 98 %. There is no height or weight on file to calculate BMI.  Treatment Plan Summary: Daily contact with patient to assess and evaluate symptoms and progress in treatment and Medication management  Status: Voluntary Disposition: TBD, difficult to place given ID diagnosis and schizophrenia with aggression -LCSW working on disposition planning  Hx of schizophrenia and aggression Continue home medications as listed - Discontinued Haldol 10 mg 3 times daily because this is not currently being  prescribed by her outpatient provider -- Continue Zyprexa 20 mg QHS (in place of Lybalvi) - Continue Cogentin 1 mg twice daily - Continue Clozapine 50 mg twice daily, ACTT confirms patient is prescribed this medicine - Ingrezza 40 mg daily - Depakote extended release 500 mg twice daily    Labs: VPA trough of 51 8/6, 57 on 8/10 UDS with oxy A1C of 7 Hgb of 11.6 MCV of 80, will need outpatient follow up   Carlyn Reichert, MD 10/13/2021 11:05 AM

## 2021-10-13 NOTE — ED Notes (Signed)
Pt calm and cooperative. No c/o pain or distress. Will continue to monitor for safety 

## 2021-10-13 NOTE — ED Notes (Signed)
Patient resting quietly in bed with eyes closed. Respirations equal and unlabored, skin warm and dry, NAD. No change in assessment or acuity. Routine safety checks conducted according to facility protocol. Will continue to monitor for safety.   

## 2021-10-13 NOTE — ED Notes (Signed)
OOB ambulating unit without difficulty, fair interaction with staff and peer thought her conversation tend to rambling .

## 2021-10-14 NOTE — BH Assessment (Signed)
LCSW Progress Note   LCSW briefly met with pt today to discuss what is being done to find her suitable placement.  Pt states that "Ms. Elinor Parkinson" who is the owner of the [facility] would just need a consent signed for her to come back and stay temporarily.  This could not be confirmed with pt's care coordinator today because she was out of the office.  It is assumed she will be back in the office tomorrow.  If not, her supervisor, Marena Chancy, [phone], [facility] can be contacted.  LCSW also spoke with Dr. Alvie Heidelberg and Dr. Dwyane Dee regarding Medicaid services and legal guardianship.  LCSW stated that the pt does not appear to need a legal guardian, but supervised adult living facility.  It is unknown, however, how she is able to handle her money.  Pt stated that she has never lived on her own, so it is suspected that she may have never been able to learn independent living skills.  Omelia Blackwater, MSW, [facility] [place] phone

## 2021-10-14 NOTE — ED Notes (Signed)
Patient A&Ox4. Denies intent to harm self/others when asked. Denies A/VH. Patient denies any physical complaints when asked. No acute distress noted. Support and encouragement provided. Routine safety checks conducted according to facility protocol. Encouraged patient to notify staff if thoughts of harm toward self or others arise. Patient verbalize understanding and agreement. Will continue to monitor for safety.    

## 2021-10-14 NOTE — ED Notes (Signed)
Pt was given dinner. 

## 2021-10-14 NOTE — ED Notes (Signed)
Patient watching TV and voices no complaints at this time.  Respirations equal and unlabored, skin warm and dry, NAD. No change in assessment or acuity. Routine safety checks conducted according to facility protocol. Will continue to monitor for safety.

## 2021-10-14 NOTE — ED Notes (Signed)
Patient resting quietly in bed with eyes closed. Respirations equal and unlabored, skin warm and dry, NAD. No change in assessment or acuity. Routine safety checks conducted according to facility protocol. Will continue to monitor for safety.   

## 2021-10-14 NOTE — ED Notes (Signed)
Pt sleeping@this time. Breathing even and unlabored. Will continue to monitor for safety 

## 2021-10-14 NOTE — ED Notes (Signed)
Teresa Murillo currently is asleep with no distress or disturb sleep noted respirations are easy and skin color WNL.

## 2021-10-14 NOTE — ED Notes (Signed)
Pt sitting on her bed calm and cooperative. No c/o pain or distress. Will continue to monitor or safety

## 2021-10-14 NOTE — ED Provider Notes (Signed)
Behavioral Health Progress Note  Date and Time: 10/14/2021 6:01 PM Name: Teresa Murillo MRN:  [id number]  Subjective:   Teresa Murillo is a 61 year old female with a past psychiatric history of schizophrenia, aggressive behavior, and possible intellectual disability.  She presented to the [facility] behavioral health urgent care after an argument with staff members at her group home that resulted in her signing discharge paperwork and leaving.  Per previous documentation, the group home staff members have reported that the patient has a history of intellectual disability.  Cannot find this documented elsewhere in the chart.  It is certainly possible, as the patient is unable to state any of her home medications and is unable to perform simple math problems.  The patient reports that she receives a disability check each month. The patient is connected with [facility] but does not receive ACT services.  The patient has a care coordinator with [facility] named Ms. Green, with whom the LCSW has frequently been communicating with to determine proper disposition for the patient.   On assessment this morning, the patient is calm and cooperative.  Per nursing documentation the patient has also been calm and cooperative.  Presently she does not appear to be psychotic and she does not admit to any auditory or visual hallucinations.  She denies suicidal or homicidal thoughts.  She reports appropriate mood, sleep, and appetite.   LCSW is currently trying to verify the patient's claim that she can be taken temporarily back into the care of her previous group home while she awaits a new placement.     Diagnosis:  Final diagnoses:  Schizophrenia, unspecified type (HCC)    Total Time spent with patient: 15 minutes  Past Psychiatric History: as above Past Medical History:  Past Medical History:  Diagnosis Date   Chronic obstructive pulmonary disease (COPD) (HCC)    Glaucoma    Hyperlipidemia     Hypertension    Iron deficiency    Schizoaffective disorder (HCC)    Type 2 diabetes mellitus (HCC)     Past Surgical History:  Procedure Laterality Date   TUBAL LIGATION     Family History:  Family History  Problem Relation Age of Onset   Breast cancer Maternal Grandmother    Family Psychiatric  History: as per H and P Social History:  Social History   Substance and Sexual Activity  Alcohol Use Yes     Social History   Substance and Sexual Activity  Drug Use Not Currently    Social History   Socioeconomic History   Marital status: Divorced    Spouse name: Not on file   Number of children: Not on file   Years of education: Not on file   Highest education level: Not on file  Occupational History   Not on file  Tobacco Use   Smoking status: Every Day    Packs/day: 1.00    Types: Cigars, Cigarettes   Smokeless tobacco: Current  Vaping Use   Vaping Use: Never used  Substance and Sexual Activity   Alcohol use: Yes   Drug use: Not Currently   Sexual activity: Not Currently    Birth control/protection: Surgical  Other Topics Concern   Not on file  Social History Narrative   Not on file   Social Determinants of Health   Financial Resource Strain: Not on file  Food Insecurity: Not on file  Transportation Needs: Not on file  Physical Activity: Not on file  Stress: Not on file  Social Connections: Not on file   SDOH:  SDOH Screenings   Alcohol Screen: Not on file  Depression (PHQ2-9): Not on file  Financial Resource Strain: Not on file  Food Insecurity: Not on file  Housing: Not on file  Physical Activity: Not on file  Social Connections: Not on file  Stress: Not on file  Tobacco Use: High Risk (09/09/2021)   Patient History    Smoking Tobacco Use: Every Day    Smokeless Tobacco Use: Current    Passive Exposure: Not on file  Transportation Needs: Not on file   Additional Social History:    Pain Medications: See MAR Prescriptions: See MAR Over  the Counter: See MAR History of alcohol / drug use?: No history of alcohol / drug abuse Longest period of sobriety (when/how long): N/A Negative Consequences of Use:  (N/A) Withdrawal Symptoms:  (N/A)                    Sleep: Fair  Appetite:  Fair  Current Medications:  Current Facility-Administered Medications  Medication Dose Route Frequency Provider Last Rate Last Admin   acetaminophen (TYLENOL) tablet 650 mg  650 mg Oral Q6H PRN Lauree Chandler, NP   650 mg at 10/14/21 1216   alum & mag hydroxide-simeth (MAALOX/MYLANTA) 200-200-20 MG/5ML suspension 30 mL  30 mL Oral Q4H PRN Lauree Chandler, NP       benztropine (COGENTIN) tablet 1 mg  1 mg Oral BID Lauree Chandler, NP   1 mg at 10/14/21 0910   cloZAPine (CLOZARIL) tablet 50 mg  50 mg Oral BID Lauree Chandler, NP   50 mg at 10/14/21 0910   divalproex (DEPAKOTE ER) 24 hr tablet 500 mg  500 mg Oral BID Lauree Chandler, NP   500 mg at 10/14/21 0910   hydrOXYzine (ATARAX) tablet 50 mg  50 mg Oral TID PRN Carlyn Reichert, MD   50 mg at 10/14/21 1416   latanoprost (XALATAN) 0.005 % ophthalmic solution 1 drop  1 drop Both Eyes QHS Lauree Chandler, NP   1 drop at 10/13/21 2120   lisinopril (ZESTRIL) tablet 10 mg  10 mg Oral Daily Lauree Chandler, NP   10 mg at 10/14/21 0910   magnesium hydroxide (MILK OF MAGNESIA) suspension 30 mL  30 mL Oral Daily PRN Lauree Chandler, NP       metFORMIN (GLUCOPHAGE) tablet 500 mg  500 mg Oral BID WC Lauree Chandler, NP   500 mg at 10/14/21 1703   mometasone-formoterol (DULERA) 200-5 MCG/ACT inhaler 2 puff  2 puff Inhalation BID Lauree Chandler, NP   2 puff at 10/14/21 0843   nicotine (NICODERM CQ - dosed in mg/24 hr) patch 7 mg  7 mg Transdermal Daily Lauree Chandler, NP   7 mg at 10/14/21 0911   OLANZapine (ZYPREXA) tablet 20 mg  20 mg Oral QHS Carlyn Reichert, MD   20 mg at 10/13/21 2118   valbenazine (INGREZZA) capsule 40 mg  40 mg Oral q AM Lauree Chandler, NP   40 mg at 10/14/21 1610   Current Outpatient Medications  Medication Sig Dispense Refill   benztropine (COGENTIN) 1 MG tablet Take 1 mg by mouth in the morning and at bedtime.     budesonide-formoterol (SYMBICORT) 160-4.5 MCG/ACT inhaler Inhale 2 puffs into the lungs in the morning and at bedtime.     Cholecalciferol (VITAMIN D3) 1.25 MG (50000 UT) CAPS Take 50,000 Units by  mouth every Thursday.     clozapine (CLOZARIL) 50 MG tablet Take 50 mg by mouth 2 (two) times daily.     divalproex (DEPAKOTE ER) 500 MG 24 hr tablet Take 500 mg by mouth in the morning and at bedtime.     haloperidol (HALDOL) 10 MG tablet Take 10 mg by mouth 3 (three) times daily.     INGREZZA 40 MG capsule Take 40 mg by mouth in the morning.     latanoprost (XALATAN) 0.005 % ophthalmic solution Place 1 drop into both eyes at bedtime.     lisinopril (ZESTRIL) 10 MG tablet Take 10 mg by mouth daily.     metFORMIN (GLUCOPHAGE) 500 MG tablet Take 500 mg by mouth 2 (two) times daily with a meal.     nicotine (NICODERM CQ - DOSED IN MG/24 HR) 7 mg/24hr patch Place 7 mg onto the skin daily.     OLANZapine-Samidorphan (LYBALVI) 20-10 MG TABS Take 1 tablet by mouth at bedtime.     PROAIR HFA 108 (90 Base) MCG/ACT inhaler Inhale 2 puffs into the lungs every 6 (six) hours as needed for wheezing or shortness of breath.     zolpidem (AMBIEN) 10 MG tablet Take 10 mg by mouth at bedtime.      Labs  Lab Results:  Admission on 10/04/2021  Component Date Value Ref Range Status   SARS Coronavirus 2 by RT PCR 10/04/2021 NEGATIVE  NEGATIVE Final   Comment: (NOTE) SARS-CoV-2 target nucleic acids are NOT DETECTED.  The SARS-CoV-2 RNA is generally detectable in upper respiratory specimens during the acute phase of infection. The lowest concentration of SARS-CoV-2 viral copies this assay can detect is 138 copies/mL. A negative result does not preclude SARS-Cov-2 infection and should not be used as the sole basis for  treatment or other patient management decisions. A negative result may occur with  improper specimen collection/handling, submission of specimen other than nasopharyngeal swab, presence of viral mutation(s) within the areas targeted by this assay, and inadequate number of viral copies(<138 copies/mL). A negative result must be combined with clinical observations, patient history, and epidemiological information. The expected result is Negative.  Fact Sheet for Patients:  BloggerCourse.com  Fact Sheet for Healthcare Providers:  SeriousBroker.it  This test is no                          t yet approved or cleared by the [place] FDA and  has been authorized for detection and/or diagnosis of SARS-CoV-2 by FDA under an Emergency Use Authorization (EUA). This EUA will remain  in effect (meaning this test can be used) for the duration of the COVID-19 declaration under Section 564(b)(1) of the Act, 21 U.S.C.section 360bbb-3(b)(1), unless the authorization is terminated  or revoked sooner.       Influenza A by PCR 10/04/2021 NEGATIVE  NEGATIVE Final   Influenza B by PCR 10/04/2021 NEGATIVE  NEGATIVE Final   Comment: (NOTE) The Xpert Xpress SARS-CoV-2/FLU/RSV plus assay is intended as an aid in the diagnosis of influenza from Nasopharyngeal swab specimens and should not be used as a sole basis for treatment. Nasal washings and aspirates are unacceptable for Xpert Xpress SARS-CoV-2/FLU/RSV testing.  Fact Sheet for Patients: BloggerCourse.com  Fact Sheet for Healthcare Providers: SeriousBroker.it  This test is not yet approved or cleared by the [place] FDA and has been authorized for detection and/or diagnosis of SARS-CoV-2 by FDA under an Emergency Use Authorization (EUA). This EUA  will remain in effect (meaning this test can be used) for the duration of the COVID-19  declaration under Section 564(b)(1) of the Act, 21 U.S.C. section 360bbb-3(b)(1), unless the authorization is terminated or revoked.  Performed at [facility], [address] [place], [place] [address]    WBC 10/04/2021 8.4  4.0 - 10.5 K/uL Final   RBC 10/04/2021 4.42  3.87 - 5.11 MIL/uL Final   Hemoglobin 10/04/2021 11.6 (L)  12.0 - 15.0 g/dL Final   HCT 62/13/086508/07/2021 35.7 (L)  36.0 - 46.0 % Final   MCV 10/04/2021 80.8  80.0 - 100.0 fL Final   MCH 10/04/2021 26.2  26.0 - 34.0 pg Final   MCHC 10/04/2021 32.5  30.0 - 36.0 g/dL Final   RDW 78/46/962908/07/2021 16.0 (H)  11.5 - 15.5 % Final   Platelets 10/04/2021 372  150 - 400 K/uL Final   nRBC 10/04/2021 0.0  0.0 - 0.2 % Final   Neutrophils Relative % 10/04/2021 68  % Final   Neutro Abs 10/04/2021 5.7  1.7 - 7.7 K/uL Final   Lymphocytes Relative 10/04/2021 22  % Final   Lymphs Abs 10/04/2021 1.8  0.7 - 4.0 K/uL Final   Monocytes Relative 10/04/2021 8  % Final   Monocytes Absolute 10/04/2021 0.7  0.1 - 1.0 K/uL Final   Eosinophils Relative 10/04/2021 1  % Final   Eosinophils Absolute 10/04/2021 0.1  0.0 - 0.5 K/uL Final   Basophils Relative 10/04/2021 1  % Final   Basophils Absolute 10/04/2021 0.1  0.0 - 0.1 K/uL Final   Immature Granulocytes 10/04/2021 0  % Final   Abs Immature Granulocytes 10/04/2021 0.03  0.00 - 0.07 K/uL Final   Performed at [facility], [address] [place], [place] [address]   Sodium 10/04/2021 136  135 - 145 mmol/L Final   Potassium 10/04/2021 4.2  3.5 - 5.1 mmol/L Final   Chloride 10/04/2021 104  98 - 111 mmol/L Final   CO2 10/04/2021 25  22 - 32 mmol/L Final   Glucose, Bld 10/04/2021 100 (H)  70 - 99 mg/dL Final   Glucose reference range applies only to samples taken after fasting for at least 8 hours.   BUN 10/04/2021 9  6 - 20 mg/dL Final   Creatinine, Ser 10/04/2021 0.52  0.44 - 1.00 mg/dL Final   Calcium 32/44/010208/07/2021 9.0  8.9 - 10.3 mg/dL Final   Total Protein 72/53/664408/07/2021 7.0  6.5 - 8.1 g/dL Final    Albumin 03/47/425908/07/2021 3.2 (L)  3.5 - 5.0 g/dL Final   AST 56/38/756408/07/2021 13 (L)  15 - 41 U/L Final   ALT 10/04/2021 10  0 - 44 U/L Final   Alkaline Phosphatase 10/04/2021 61  38 - 126 U/L Final   Total Bilirubin 10/04/2021 0.3  0.3 - 1.2 mg/dL Final   GFR, Estimated 10/04/2021 >60  >60 mL/min Final   Comment: (NOTE) Calculated using the CKD-EPI Creatinine Equation (2021)    Anion gap 10/04/2021 7  5 - 15 Final   Performed at [facility], [address] [place], [place] [address]   Hgb A1c MFr Bld 10/04/2021 7.0 (H)  4.8 - 5.6 % Final   Comment: (NOTE) Pre diabetes:          5.7%-6.4%  Diabetes:              >6.4%  Glycemic control for   <7.0% adults with diabetes    Mean Plasma Glucose 10/04/2021 154.2  mg/dL Final   Performed at [facility], [address], [place], [place] [address]   Cholesterol 10/04/2021 178  0 - 200 mg/dL Final   Triglycerides 02/72/5366 155 (H)  <150 mg/dL Final   HDL 44/04/4740 52  >40 mg/dL Final   Total CHOL/HDL Ratio 10/04/2021 3.4  RATIO Final   VLDL 10/04/2021 31  0 - 40 mg/dL Final   LDL Cholesterol 10/04/2021 95  0 - 99 mg/dL Final   Comment:        Total Cholesterol/HDL:CHD Risk Coronary Heart Disease Risk Table                     Men   Women  1/2 Average Risk   3.4   3.3  Average Risk       5.0   4.4  2 X Average Risk   9.6   7.1  3 X Average Risk  23.4   11.0        Use the calculated Patient Ratio above and the CHD Risk Table to determine the patient's CHD Risk.        ATP III CLASSIFICATION (LDL):  <100     mg/dL   Optimal  595-638  mg/dL   Near or Above                    Optimal  130-159  mg/dL   Borderline  756-433  mg/dL   High  >295     mg/dL   Very High Performed at [facility], [address] [place], [place] [address]    POC Amphetamine UR 10/04/2021 None Detected  NONE DETECTED (Cut Off Level 1000 ng/mL) Final   POC Secobarbital (BAR) 10/04/2021 None Detected  NONE DETECTED (Cut Off Level 300 ng/mL)  Final   POC Buprenorphine (BUP) 10/04/2021 None Detected  NONE DETECTED (Cut Off Level 10 ng/mL) Final   POC Oxazepam (BZO) 10/04/2021 None Detected  NONE DETECTED (Cut Off Level 300 ng/mL) Final   POC Cocaine UR 10/04/2021 None Detected  NONE DETECTED (Cut Off Level 300 ng/mL) Final   POC Methamphetamine UR 10/04/2021 None Detected  NONE DETECTED (Cut Off Level 1000 ng/mL) Final   POC Morphine 10/04/2021 None Detected  NONE DETECTED (Cut Off Level 300 ng/mL) Final   POC Methadone UR 10/04/2021 None Detected  NONE DETECTED (Cut Off Level 300 ng/mL) Final   POC Oxycodone UR 10/04/2021 Positive (A)  NONE DETECTED (Cut Off Level 100 ng/mL) Final   POC Marijuana UR 10/04/2021 None Detected  NONE DETECTED (Cut Off Level 50 ng/mL) Final   SARSCOV2ONAVIRUS 2 AG 10/04/2021 NEGATIVE  NEGATIVE Final   Comment: (NOTE) SARS-CoV-2 antigen NOT DETECTED.   Negative results are presumptive.  Negative results do not preclude SARS-CoV-2 infection and should not be used as the sole basis for treatment or other patient management decisions, including infection  control decisions, particularly in the presence of clinical signs and  symptoms consistent with COVID-19, or in those who have been in contact with the virus.  Negative results must be combined with clinical observations, patient history, and epidemiological information. The expected result is Negative.  Fact Sheet for Patients: https://www.jennings-kim.com/  Fact Sheet for Healthcare Providers: https://alexander-rogers.biz/  This test is not yet approved or cleared by the [place] FDA and  has been authorized for detection and/or diagnosis of SARS-CoV-2 by FDA under an Emergency Use Authorization (EUA).  This EUA will remain in effect (meaning this  test can be used) for the duration of  the COV                          ID-19 declaration under Section 564(b)(1) of the Act, 21 U.S.C. section 360bbb-3(b)(1), unless the  authorization is terminated or revoked sooner.     Valproic Acid Lvl 10/04/2021 51  50.0 - 100.0 ug/mL Final   Performed at [facility], [address] [place], [place] [address]   Valproic Acid Lvl 10/08/2021 57  50.0 - 100.0 ug/mL Final   Performed at [facility], [address], [place], [place] [address]   Glucose-Capillary 10/09/2021 123 (H)  70 - 99 mg/dL Final   Glucose reference range applies only to samples taken after fasting for at least 8 hours.  Admission on 09/09/2021, Discharged on 09/10/2021  Component Date Value Ref Range Status   Sodium 09/09/2021 134 (L)  135 - 145 mmol/L Final   Potassium 09/09/2021 4.3  3.5 - 5.1 mmol/L Final   Chloride 09/09/2021 99  98 - 111 mmol/L Final   CO2 09/09/2021 25  22 - 32 mmol/L Final   Glucose, Bld 09/09/2021 107 (H)  70 - 99 mg/dL Final   Glucose reference range applies only to samples taken after fasting for at least 8 hours.   BUN 09/09/2021 12  6 - 20 mg/dL Final   Creatinine, Ser 09/09/2021 0.74  0.44 - 1.00 mg/dL Final   Calcium 29/56/2130 9.0  8.9 - 10.3 mg/dL Final   Total Protein 86/57/8469 7.4  6.5 - 8.1 g/dL Final   Albumin 62/95/2841 3.1 (L)  3.5 - 5.0 g/dL Final   AST 32/44/0102 13 (L)  15 - 41 U/L Final   ALT 09/09/2021 13  0 - 44 U/L Final   Alkaline Phosphatase 09/09/2021 66  38 - 126 U/L Final   Total Bilirubin 09/09/2021 0.2 (L)  0.3 - 1.2 mg/dL Final   GFR, Estimated 09/09/2021 >60  >60 mL/min Final   Comment: (NOTE) Calculated using the CKD-EPI Creatinine Equation (2021)    Anion gap 09/09/2021 10  5 - 15 Final   Performed at [facility], [address] [place], [place] [address]   WBC 09/09/2021 8.5  4.0 - 10.5 K/uL Final   RBC 09/09/2021 4.53  3.87 - 5.11 MIL/uL Final   Hemoglobin 09/09/2021 11.8 (L)  12.0 - 15.0 g/dL Final   HCT 64/40/3474 38.0  36.0 - 46.0 % Final   MCV 09/09/2021 83.9  80.0 - 100.0 fL Final   MCH 09/09/2021 26.0  26.0 - 34.0 pg Final   MCHC 09/09/2021 31.1  30.0  - 36.0 g/dL Final   RDW 25/95/6387 17.8 (H)  11.5 - 15.5 % Final   Platelets 09/09/2021 442 (H)  150 - 400 K/uL Final   nRBC 09/09/2021 0.0  0.0 - 0.2 % Final   Neutrophils Relative % 09/09/2021 70  % Final   Neutro Abs 09/09/2021 5.9  1.7 - 7.7 K/uL Final   Lymphocytes Relative 09/09/2021 20  % Final   Lymphs Abs 09/09/2021 1.7  0.7 - 4.0 K/uL Final   Monocytes Relative 09/09/2021 8  % Final   Monocytes Absolute 09/09/2021 0.7  0.1 - 1.0 K/uL Final   Eosinophils Relative 09/09/2021 1  % Final   Eosinophils Absolute 09/09/2021 0.1  0.0 - 0.5 K/uL Final   Basophils Relative 09/09/2021 1  % Final   Basophils Absolute 09/09/2021  0.1  0.0 - 0.1 K/uL Final   Immature Granulocytes 09/09/2021 0  % Final   Abs Immature Granulocytes 09/09/2021 0.03  0.00 - 0.07 K/uL Final   Performed at [facility], [address] [place], [place] [address]   Ammonia 09/09/2021 37 (H)  9 - 35 umol/L Final   Comment: HEMOLYSIS AT THIS LEVEL MAY AFFECT RESULT Performed at [facility], [address], [place], [place] [address]    Opiates 09/09/2021 NONE DETECTED  NONE DETECTED Final   Cocaine 09/09/2021 NONE DETECTED  NONE DETECTED Final   Benzodiazepines 09/09/2021 NONE DETECTED  NONE DETECTED Final   Amphetamines 09/09/2021 NONE DETECTED  NONE DETECTED Final   Tetrahydrocannabinol 09/09/2021 NONE DETECTED  NONE DETECTED Final   Barbiturates 09/09/2021 NONE DETECTED  NONE DETECTED Final   Comment: (NOTE) DRUG SCREEN FOR MEDICAL PURPOSES ONLY.  IF CONFIRMATION IS NEEDED FOR ANY PURPOSE, NOTIFY LAB WITHIN 5 DAYS.  LOWEST DETECTABLE LIMITS FOR URINE DRUG SCREEN Drug Class                     Cutoff (ng/mL) Amphetamine and metabolites    1000 Barbiturate and metabolites    200 Benzodiazepine                 200 Tricyclics and metabolites     300 Opiates and metabolites        300 Cocaine and metabolites        300 THC                            50 Performed at [facility], [address], [place], [place] [address]    Alcohol, Ethyl (B) 09/09/2021 <10  <10 mg/dL Final   Comment: (NOTE) Lowest detectable limit for serum alcohol is 10 mg/dL.  For medical purposes only. Performed at [facility], [address] [place], [place] [address]    Lipase 09/09/2021 33  11 - 51 U/L Final   Performed at [facility], [address] [place], [place] [address]   Color, Urine 09/09/2021 COLORLESS (A)  YELLOW Final   APPearance 09/09/2021 CLEAR  CLEAR Final   Specific Gravity, Urine 09/09/2021 1.002 (L)  1.005 - 1.030 Final   pH 09/09/2021 6.0  5.0 - 8.0 Final   Glucose, UA 09/09/2021 NEGATIVE  NEGATIVE mg/dL Final   Hgb urine dipstick 09/09/2021 NEGATIVE  NEGATIVE Final   Bilirubin Urine 09/09/2021 NEGATIVE  NEGATIVE Final   Ketones, ur 09/09/2021 NEGATIVE  NEGATIVE mg/dL Final   Protein, ur 53/61/4431 NEGATIVE  NEGATIVE mg/dL Final   Nitrite 54/00/8676 NEGATIVE  NEGATIVE Final   Leukocytes,Ua 09/09/2021 NEGATIVE  NEGATIVE Final   Performed at [facility], [address] [place], [place] [address]   Specimen Description 09/09/2021 URINE, CLEAN CATCH   Final   Special Requests 09/09/2021 NONE   Final   Culture 09/09/2021  (A)   Final                   Value:<10,000 COLONIES/mL INSIGNIFICANT GROWTH Performed at [facility], [address], [place], [place] [address]    Report Status 09/09/2021 09/10/2021 FINAL   Final   D-Dimer, Quant 09/09/2021 0.92 (H)  0.00 - 0.50 ug/mL-FEU Final   Comment: (NOTE) At the manufacturer cut-off value of 0.5 g/mL FEU, this assay has a negative predictive value of  95-100%.This assay is intended for use in conjunction with a clinical pretest probability (PTP) assessment model to exclude pulmonary embolism (PE) and deep venous thrombosis (DVT) in outpatients suspected of PE or DVT. Results should be correlated with clinical presentation. Performed at [facility], [address], [place],  [place] [address]    Troponin I (High Sensitivity) 09/09/2021 5  <18 ng/L Final   Comment: (NOTE) Elevated high sensitivity troponin I (hsTnI) values and significant  changes across serial measurements may suggest ACS but many other  chronic and acute conditions are known to elevate hsTnI results.  Refer to the "Links" section for chest pain algorithms and additional  guidance. Performed at [facility], [address] [place], [place] [address]    BP 09/10/2021 135/83  mmHg Final   S' Lateral 09/10/2021 2.30  cm Final   Area-P 1/2 09/10/2021 5.58  cm2 Final    Blood Alcohol level:  Lab Results  Component Value Date   ETH <10 09/09/2021   ETH <10 10/20/2020    Metabolic Disorder Labs: Lab Results  Component Value Date   HGBA1C 7.0 (H) 10/04/2021   MPG 154.2 10/04/2021   No results found for: "PROLACTIN" Lab Results  Component Value Date   CHOL 178 10/04/2021   TRIG 155 (H) 10/04/2021   HDL 52 10/04/2021   CHOLHDL 3.4 10/04/2021   VLDL 31 10/04/2021   LDLCALC 95 10/04/2021    Therapeutic Lab Levels: No results found for: "LITHIUM" Lab Results  Component Value Date   VALPROATE 57 10/08/2021   VALPROATE 51 10/04/2021   No results found for: "CBMZ"  Physical Findings   Flowsheet Row ED from 10/04/2021 in [facility] ED from 09/09/2021 in [facility] EMERGENCY DEPARTMENT ED from 08/22/2021 in [facility] Dept  C-SSRS RISK CATEGORY No Risk No Risk No Risk        Musculoskeletal  Strength & Muscle Tone: within normal limits Gait & Station: normal Patient leans: N/A  Psychiatric Specialty Exam  Presentation  General Appearance: Appropriate for Environment  Eye Contact:Good  Speech:Clear and Coherent  Speech Volume:Normal  Handedness:Right   Mood and Affect  Mood:Euthymic  Affect:Constricted   Thought Process  Thought Processes:Coherent; Linear  Descriptions of  Associations:Intact  Orientation:Full (Time, Place and Person)  Thought Content:Logical  Diagnosis of Schizophrenia or Schizoaffective disorder in past: Yes  Duration of Psychotic Symptoms: Greater than six months   Hallucinations:No data recorded  Ideas of Reference:None  Suicidal Thoughts: denies  Homicidal Thoughts: denies   Sensorium  Memory:Immediate Fair; Recent Fair; Remote Fair  Judgment:Poor  Insight:Poor   Executive Functions  Concentration:Poor  Attention Span:Poor  Recall:Poor  Fund of Knowledge:Poor  Language:Poor   Psychomotor Activity  Psychomotor Activity: normal   Assets  Assets:Communication Skills; Desire for Improvement   Sleep  Sleep: fair   Physical Exam  Physical Exam Constitutional:      Appearance: the patient is not toxic-appearing.  Pulmonary:     Effort: Pulmonary effort is normal.  Neurological:     General: No focal deficit present.     Mental Status: the patient is alert and oriented to person, place, and time.   Review of Systems  Respiratory:  Negative for shortness of breath.   Cardiovascular:  Negative for chest pain.  Gastrointestinal:  Negative for abdominal pain, constipation, diarrhea, nausea and vomiting.  Neurological:  Negative for headaches.   Blood pressure 102/63, pulse 93, temperature 98.3 F (36.8  C), temperature source Tympanic, resp. rate 18, SpO2 96 %. There is no height or weight on file to calculate BMI.  Treatment Plan Summary: Daily contact with patient to assess and evaluate symptoms and progress in treatment and Medication management  Status: Voluntary Disposition: TBD, difficult to place given ID diagnosis and schizophrenia with aggression -LCSW working on disposition planning  Hx of schizophrenia and aggression Continue home medications as listed - Discontinued Haldol 10 mg 3 times daily because this is not currently being prescribed by her outpatient provider -- Continue Zyprexa 20  mg QHS (in place of Lybalvi) - Continue Cogentin 1 mg twice daily - Continue Clozapine 50 mg twice daily, ACTT confirms patient is prescribed this medicine - Ingrezza 40 mg daily - Depakote extended release 500 mg twice daily    Labs: VPA trough of 51 8/6, 57 on 8/10 UDS with oxy A1C of 7 Hgb of 11.6 MCV of 80, will need outpatient follow up   Carlyn Reichert, MD 10/14/2021 6:01 PM

## 2021-10-14 NOTE — ED Notes (Signed)
Pt was given muffin and juice for breakfast.  

## 2021-10-14 NOTE — ED Notes (Signed)
Patient reports increased anxiety and was medicated per PRN MAR orders.  Respirations equal and unlabored, skin warm and dry, NAD. No change in assessment or acuity. Safety protocols remain in place.

## 2021-10-15 NOTE — ED Provider Notes (Signed)
Behavioral Health Progress Note  Date and Time: 10/15/2021 4:26 PM Name: Teresa Murillo MRN:  [id number]  Subjective:   Teresa Murillo is a 61 year old female with a past psychiatric history of schizophrenia, aggressive behavior, and possible intellectual disability.  She presented to the [facility] behavioral health urgent care after an argument with staff members at her group home that resulted in her signing discharge paperwork and leaving.  Per previous documentation, the group home staff members have reported that the patient has a history of intellectual disability.  Cannot find this documented elsewhere in the chart.  It is certainly possible, as the patient is unable to state any of her home medications and is unable to perform simple math problems.  The patient reports that she receives a disability check each month. The patient is connected with [facility] but does not receive ACT services.  The patient has a care coordinator with [facility] named Ms. Green, with whom the LCSW has frequently been communicating with to determine proper disposition for the patient.   On assessment this morning, the patient is calm and cooperative.  Per nursing documentation the patient has also been calm and cooperative.  Presently she does not appear to be psychotic and she does not admit to any auditory or visual hallucinations.  She denies suicidal or homicidal thoughts.  She reports appropriate mood, sleep, and appetite.   LCSW contacted Ms. Chilton Si, patient's care coordinator [facility].  Ms. Chilton Si reports that she is looking for short-term stay facilities for the patient.  She also reports that she is investigating whether or not the patient can return to her group home.    Diagnosis:  Final diagnoses:  Schizophrenia, unspecified type (HCC)    Total Time spent with patient: 15 minutes  Past Psychiatric History: as above Past Medical History:  Past Medical History:  Diagnosis Date    Chronic obstructive pulmonary disease (COPD) (HCC)    Glaucoma    Hyperlipidemia    Hypertension    Iron deficiency    Schizoaffective disorder (HCC)    Type 2 diabetes mellitus (HCC)     Past Surgical History:  Procedure Laterality Date   TUBAL LIGATION     Family History:  Family History  Problem Relation Age of Onset   Breast cancer Maternal Grandmother    Family Psychiatric  History: as per H and P Social History:  Social History   Substance and Sexual Activity  Alcohol Use Yes     Social History   Substance and Sexual Activity  Drug Use Not Currently    Social History   Socioeconomic History   Marital status: Divorced    Spouse name: Not on file   Number of children: Not on file   Years of education: Not on file   Highest education level: Not on file  Occupational History   Not on file  Tobacco Use   Smoking status: Every Day    Packs/day: 1.00    Types: Cigars, Cigarettes   Smokeless tobacco: Current  Vaping Use   Vaping Use: Never used  Substance and Sexual Activity   Alcohol use: Yes   Drug use: Not Currently   Sexual activity: Not Currently    Birth control/protection: Surgical  Other Topics Concern   Not on file  Social History Narrative   Not on file   Social Determinants of Health   Financial Resource Strain: Not on file  Food Insecurity: Not on file  Transportation Needs: Not on file  Physical Activity: Not on file  Stress: Not on file  Social Connections: Not on file   SDOH:  SDOH Screenings   Alcohol Screen: Not on file  Depression (PHQ2-9): Not on file  Financial Resource Strain: Not on file  Food Insecurity: Not on file  Housing: Not on file  Physical Activity: Not on file  Social Connections: Not on file  Stress: Not on file  Tobacco Use: High Risk (09/09/2021)   Patient History    Smoking Tobacco Use: Every Day    Smokeless Tobacco Use: Current    Passive Exposure: Not on file  Transportation Needs: Not on file    Additional Social History:    Pain Medications: See MAR Prescriptions: See MAR Over the Counter: See MAR History of alcohol / drug use?: No history of alcohol / drug abuse Longest period of sobriety (when/how long): N/A Negative Consequences of Use:  (N/A) Withdrawal Symptoms:  (N/A)                    Sleep: Fair  Appetite:  Fair  Current Medications:  Current Facility-Administered Medications  Medication Dose Route Frequency Provider Last Rate Last Admin   acetaminophen (TYLENOL) tablet 650 mg  650 mg Oral Q6H PRN Tharon Aquas, NP   650 mg at 10/15/21 1123   alum & mag hydroxide-simeth (MAALOX/MYLANTA) 200-200-20 MG/5ML suspension 30 mL  30 mL Oral Q4H PRN Tharon Aquas, NP       benztropine (COGENTIN) tablet 1 mg  1 mg Oral BID Tharon Aquas, NP   1 mg at 10/15/21 0910   cloZAPine (CLOZARIL) tablet 50 mg  50 mg Oral BID Tharon Aquas, NP   50 mg at 10/15/21 0909   divalproex (DEPAKOTE ER) 24 hr tablet 500 mg  500 mg Oral BID Tharon Aquas, NP   500 mg at 10/15/21 0910   hydrOXYzine (ATARAX) tablet 50 mg  50 mg Oral TID PRN Corky Sox, MD   50 mg at 10/14/21 1416   latanoprost (XALATAN) 0.005 % ophthalmic solution 1 drop  1 drop Both Eyes QHS Tharon Aquas, NP   1 drop at 10/14/21 2123   lisinopril (ZESTRIL) tablet 10 mg  10 mg Oral Daily Tharon Aquas, NP   10 mg at 10/15/21 0910   magnesium hydroxide (MILK OF MAGNESIA) suspension 30 mL  30 mL Oral Daily PRN Tharon Aquas, NP       metFORMIN (GLUCOPHAGE) tablet 500 mg  500 mg Oral BID WC Tharon Aquas, NP   500 mg at 10/15/21 0910   mometasone-formoterol (DULERA) 200-5 MCG/ACT inhaler 2 puff  2 puff Inhalation BID Tharon Aquas, NP   2 puff at 10/15/21 0909   nicotine (NICODERM CQ - dosed in mg/24 hr) patch 7 mg  7 mg Transdermal Daily Tharon Aquas, NP   7 mg at 10/15/21 0910   OLANZapine (ZYPREXA) tablet 20 mg  20 mg Oral QHS Corky Sox, MD    20 mg at 10/14/21 2121   valbenazine (INGREZZA) capsule 40 mg  40 mg Oral q AM Tharon Aquas, NP   40 mg at 10/15/21 0909   Current Outpatient Medications  Medication Sig Dispense Refill   benztropine (COGENTIN) 1 MG tablet Take 1 mg by mouth in the morning and at bedtime.     budesonide-formoterol (SYMBICORT) 160-4.5 MCG/ACT inhaler Inhale 2 puffs into the lungs in the morning and at bedtime.     Cholecalciferol (  VITAMIN D3) 1.25 MG (50000 UT) CAPS Take 50,000 Units by mouth every Thursday.     clozapine (CLOZARIL) 50 MG tablet Take 50 mg by mouth 2 (two) times daily.     divalproex (DEPAKOTE ER) 500 MG 24 hr tablet Take 500 mg by mouth in the morning and at bedtime.     haloperidol (HALDOL) 10 MG tablet Take 10 mg by mouth 3 (three) times daily.     INGREZZA 40 MG capsule Take 40 mg by mouth in the morning.     latanoprost (XALATAN) 0.005 % ophthalmic solution Place 1 drop into both eyes at bedtime.     lisinopril (ZESTRIL) 10 MG tablet Take 10 mg by mouth daily.     metFORMIN (GLUCOPHAGE) 500 MG tablet Take 500 mg by mouth 2 (two) times daily with a meal.     nicotine (NICODERM CQ - DOSED IN MG/24 HR) 7 mg/24hr patch Place 7 mg onto the skin daily.     OLANZapine-Samidorphan (LYBALVI) 20-10 MG TABS Take 1 tablet by mouth at bedtime.     PROAIR HFA 108 (90 Base) MCG/ACT inhaler Inhale 2 puffs into the lungs every 6 (six) hours as needed for wheezing or shortness of breath.     zolpidem (AMBIEN) 10 MG tablet Take 10 mg by mouth at bedtime.      Labs  Lab Results:  Admission on 10/04/2021  Component Date Value Ref Range Status   SARS Coronavirus 2 by RT PCR 10/04/2021 NEGATIVE  NEGATIVE Final   Comment: (NOTE) SARS-CoV-2 target nucleic acids are NOT DETECTED.  The SARS-CoV-2 RNA is generally detectable in upper respiratory specimens during the acute phase of infection. The lowest concentration of SARS-CoV-2 viral copies this assay can detect is 138 copies/mL. A negative result  does not preclude SARS-Cov-2 infection and should not be used as the sole basis for treatment or other patient management decisions. A negative result may occur with  improper specimen collection/handling, submission of specimen other than nasopharyngeal swab, presence of viral mutation(s) within the areas targeted by this assay, and inadequate number of viral copies(<138 copies/mL). A negative result must be combined with clinical observations, patient history, and epidemiological information. The expected result is Negative.  Fact Sheet for Patients:  EntrepreneurPulse.com.au  Fact Sheet for Healthcare Providers:  IncredibleEmployment.be  This test is no                          t yet approved or cleared by the [place] FDA and  has been authorized for detection and/or diagnosis of SARS-CoV-2 by FDA under an Emergency Use Authorization (EUA). This EUA will remain  in effect (meaning this test can be used) for the duration of the COVID-19 declaration under Section 564(b)(1) of the Act, 21 U.S.C.section 360bbb-3(b)(1), unless the authorization is terminated  or revoked sooner.       Influenza A by PCR 10/04/2021 NEGATIVE  NEGATIVE Final   Influenza B by PCR 10/04/2021 NEGATIVE  NEGATIVE Final   Comment: (NOTE) The Xpert Xpress SARS-CoV-2/FLU/RSV plus assay is intended as an aid in the diagnosis of influenza from Nasopharyngeal swab specimens and should not be used as a sole basis for treatment. Nasal washings and aspirates are unacceptable for Xpert Xpress SARS-CoV-2/FLU/RSV testing.  Fact Sheet for Patients: EntrepreneurPulse.com.au  Fact Sheet for Healthcare Providers: IncredibleEmployment.be  This test is not yet approved or cleared by the [place] FDA and has been authorized for detection and/or diagnosis of  SARS-CoV-2 by FDA under an Emergency Use Authorization (EUA). This EUA will  remain in effect (meaning this test can be used) for the duration of the COVID-19 declaration under Section 564(b)(1) of the Act, 21 U.S.C. section 360bbb-3(b)(1), unless the authorization is terminated or revoked.  Performed at [facility], [place] [address], [place], [place] [address]    WBC 10/04/2021 8.4  4.0 - 10.5 K/uL Final   RBC 10/04/2021 4.42  3.87 - 5.11 MIL/uL Final   Hemoglobin 10/04/2021 11.6 (L)  12.0 - 15.0 g/dL Final   HCT 10/04/2021 35.7 (L)  36.0 - 46.0 % Final   MCV 10/04/2021 80.8  80.0 - 100.0 fL Final   MCH 10/04/2021 26.2  26.0 - 34.0 pg Final   MCHC 10/04/2021 32.5  30.0 - 36.0 g/dL Final   RDW 10/04/2021 16.0 (H)  11.5 - 15.5 % Final   Platelets 10/04/2021 372  150 - 400 K/uL Final   nRBC 10/04/2021 0.0  0.0 - 0.2 % Final   Neutrophils Relative % 10/04/2021 68  % Final   Neutro Abs 10/04/2021 5.7  1.7 - 7.7 K/uL Final   Lymphocytes Relative 10/04/2021 22  % Final   Lymphs Abs 10/04/2021 1.8  0.7 - 4.0 K/uL Final   Monocytes Relative 10/04/2021 8  % Final   Monocytes Absolute 10/04/2021 0.7  0.1 - 1.0 K/uL Final   Eosinophils Relative 10/04/2021 1  % Final   Eosinophils Absolute 10/04/2021 0.1  0.0 - 0.5 K/uL Final   Basophils Relative 10/04/2021 1  % Final   Basophils Absolute 10/04/2021 0.1  0.0 - 0.1 K/uL Final   Immature Granulocytes 10/04/2021 0  % Final   Abs Immature Granulocytes 10/04/2021 0.03  0.00 - 0.07 K/uL Final   Performed at [facility], [place] [address] [place], [place] [address]   Sodium 10/04/2021 136  135 - 145 mmol/L Final   Potassium 10/04/2021 4.2  3.5 - 5.1 mmol/L Final   Chloride 10/04/2021 104  98 - 111 mmol/L Final   CO2 10/04/2021 25  22 - 32 mmol/L Final   Glucose, Bld 10/04/2021 100 (H)  70 - 99 mg/dL Final   Glucose reference range applies only to samples taken after fasting for at least 8 hours.   BUN 10/04/2021 9  6 - 20 mg/dL Final   Creatinine, Ser 10/04/2021 0.52  0.44 - 1.00 mg/dL Final   Calcium  10/04/2021 9.0  8.9 - 10.3 mg/dL Final   Total Protein 10/04/2021 7.0  6.5 - 8.1 g/dL Final   Albumin 10/04/2021 3.2 (L)  3.5 - 5.0 g/dL Final   AST 10/04/2021 13 (L)  15 - 41 U/L Final   ALT 10/04/2021 10  0 - 44 U/L Final   Alkaline Phosphatase 10/04/2021 61  38 - 126 U/L Final   Total Bilirubin 10/04/2021 0.3  0.3 - 1.2 mg/dL Final   GFR, Estimated 10/04/2021 >60  >60 mL/min Final   Comment: (NOTE) Calculated using the CKD-EPI Creatinine Equation (2021)    Anion gap 10/04/2021 7  5 - 15 Final   Performed at [facility] [address], [place], [place] [address]   Hgb A1c MFr Bld 10/04/2021 7.0 (H)  4.8 - 5.6 % Final   Comment: (NOTE) Pre diabetes:          5.7%-6.4%  Diabetes:              >6.4%  Glycemic control for   <7.0% adults  with diabetes    Mean Plasma Glucose 10/04/2021 154.2  mg/dL Final   Performed at [facility] [address] [place], [place] [address]   Cholesterol 10/04/2021 178  0 - 200 mg/dL Final   Triglycerides 10/04/2021 155 (H)  <150 mg/dL Final   HDL 10/04/2021 52  >40 mg/dL Final   Total CHOL/HDL Ratio 10/04/2021 3.4  RATIO Final   VLDL 10/04/2021 31  0 - 40 mg/dL Final   LDL Cholesterol 10/04/2021 95  0 - 99 mg/dL Final   Comment:        Total Cholesterol/HDL:CHD Risk Coronary Heart Disease Risk Table                     Men   Women  1/2 Average Risk   3.4   3.3  Average Risk       5.0   4.4  2 X Average Risk   9.6   7.1  3 X Average Risk  23.4   11.0        Use the calculated Patient Ratio above and the CHD Risk Table to determine the patient's CHD Risk.        ATP III CLASSIFICATION (LDL):  <100     mg/dL   Optimal  100-129  mg/dL   Near or Above                    Optimal  130-159  mg/dL   Borderline  160-189  mg/dL   High  >190     mg/dL   Very High Performed at [facility] [address], [place], [place] [address]    POC Amphetamine UR 10/04/2021 None Detected  NONE DETECTED (Cut Off Level 1000 ng/mL) Final    POC Secobarbital (BAR) 10/04/2021 None Detected  NONE DETECTED (Cut Off Level 300 ng/mL) Final   POC Buprenorphine (BUP) 10/04/2021 None Detected  NONE DETECTED (Cut Off Level 10 ng/mL) Final   POC Oxazepam (BZO) 10/04/2021 None Detected  NONE DETECTED (Cut Off Level 300 ng/mL) Final   POC Cocaine UR 10/04/2021 None Detected  NONE DETECTED (Cut Off Level 300 ng/mL) Final   POC Methamphetamine UR 10/04/2021 None Detected  NONE DETECTED (Cut Off Level 1000 ng/mL) Final   POC Morphine 10/04/2021 None Detected  NONE DETECTED (Cut Off Level 300 ng/mL) Final   POC Methadone UR 10/04/2021 None Detected  NONE DETECTED (Cut Off Level 300 ng/mL) Final   POC Oxycodone UR 10/04/2021 Positive (A)  NONE DETECTED (Cut Off Level 100 ng/mL) Final   POC Marijuana UR 10/04/2021 None Detected  NONE DETECTED (Cut Off Level 50 ng/mL) Final   SARSCOV2ONAVIRUS 2 AG 10/04/2021 NEGATIVE  NEGATIVE Final   Comment: (NOTE) SARS-CoV-2 antigen NOT DETECTED.   Negative results are presumptive.  Negative results do not preclude SARS-CoV-2 infection and should not be used as the sole basis for treatment or other patient management decisions, including infection  control decisions, particularly in the presence of clinical signs and  symptoms consistent with COVID-19, or in those who have been in contact with the virus.  Negative results must be combined with clinical observations, patient history, and epidemiological information. The expected result is Negative.  Fact Sheet for Patients: HandmadeRecipes.com.cy  Fact Sheet for Healthcare Providers: FuneralLife.at  This test is not yet approved or cleared by the [place] FDA and  has been authorized for detection and/or diagnosis of SARS-CoV-2 by FDA under an Emergency Use  Authorization (EUA).  This EUA will remain in effect (meaning this test can be used) for the duration of  the COV                          ID-19  declaration under Section 564(b)(1) of the Act, 21 U.S.C. section 360bbb-3(b)(1), unless the authorization is terminated or revoked sooner.     Valproic Acid Lvl 10/04/2021 51  50.0 - 100.0 ug/mL Final   Performed at [facility] [address] [place], [place] [address]   Valproic Acid Lvl 10/08/2021 57  50.0 - 100.0 ug/mL Final   Performed at [facility] [address], [place], [place] [address]   Glucose-Capillary 10/09/2021 123 (H)  70 - 99 mg/dL Final   Glucose reference range applies only to samples taken after fasting for at least 8 hours.  Admission on 09/09/2021, Discharged on 09/10/2021  Component Date Value Ref Range Status   Sodium 09/09/2021 134 (L)  135 - 145 mmol/L Final   Potassium 09/09/2021 4.3  3.5 - 5.1 mmol/L Final   Chloride 09/09/2021 99  98 - 111 mmol/L Final   CO2 09/09/2021 25  22 - 32 mmol/L Final   Glucose, Bld 09/09/2021 107 (H)  70 - 99 mg/dL Final   Glucose reference range applies only to samples taken after fasting for at least 8 hours.   BUN 09/09/2021 12  6 - 20 mg/dL Final   Creatinine, Ser 09/09/2021 0.74  0.44 - 1.00 mg/dL Final   Calcium 09/09/2021 9.0  8.9 - 10.3 mg/dL Final   Total Protein 09/09/2021 7.4  6.5 - 8.1 g/dL Final   Albumin 09/09/2021 3.1 (L)  3.5 - 5.0 g/dL Final   AST 09/09/2021 13 (L)  15 - 41 U/L Final   ALT 09/09/2021 13  0 - 44 U/L Final   Alkaline Phosphatase 09/09/2021 66  38 - 126 U/L Final   Total Bilirubin 09/09/2021 0.2 (L)  0.3 - 1.2 mg/dL Final   GFR, Estimated 09/09/2021 >60  >60 mL/min Final   Comment: (NOTE) Calculated using the CKD-EPI Creatinine Equation (2021)    Anion gap 09/09/2021 10  5 - 15 Final   Performed at [facility], [place] [address], [place], [place] [address]   WBC 09/09/2021 8.5  4.0 - 10.5 K/uL Final   RBC 09/09/2021 4.53  3.87 - 5.11 MIL/uL Final   Hemoglobin 09/09/2021 11.8 (L)  12.0 - 15.0 g/dL Final   HCT 09/09/2021 38.0  36.0 - 46.0 % Final   MCV 09/09/2021 83.9   80.0 - 100.0 fL Final   MCH 09/09/2021 26.0  26.0 - 34.0 pg Final   MCHC 09/09/2021 31.1  30.0 - 36.0 g/dL Final   RDW 09/09/2021 17.8 (H)  11.5 - 15.5 % Final   Platelets 09/09/2021 442 (H)  150 - 400 K/uL Final   nRBC 09/09/2021 0.0  0.0 - 0.2 % Final   Neutrophils Relative % 09/09/2021 70  % Final   Neutro Abs 09/09/2021 5.9  1.7 - 7.7 K/uL Final   Lymphocytes Relative 09/09/2021 20  % Final   Lymphs Abs 09/09/2021 1.7  0.7 - 4.0 K/uL Final   Monocytes Relative 09/09/2021 8  % Final   Monocytes Absolute 09/09/2021 0.7  0.1 - 1.0 K/uL Final   Eosinophils Relative 09/09/2021 1  % Final   Eosinophils Absolute 09/09/2021 0.1  0.0 - 0.5 K/uL Final   Basophils  Relative 09/09/2021 1  % Final   Basophils Absolute 09/09/2021 0.1  0.0 - 0.1 K/uL Final   Immature Granulocytes 09/09/2021 0  % Final   Abs Immature Granulocytes 09/09/2021 0.03  0.00 - 0.07 K/uL Final   Performed at [facility], [address], [place], [place] [address]   Ammonia 09/09/2021 37 (H)  9 - 35 umol/L Final   Comment: HEMOLYSIS AT THIS LEVEL MAY AFFECT RESULT Performed at [facility], [address], [place], [place] [address]    Opiates 09/09/2021 NONE DETECTED  NONE DETECTED Final   Cocaine 09/09/2021 NONE DETECTED  NONE DETECTED Final   Benzodiazepines 09/09/2021 NONE DETECTED  NONE DETECTED Final   Amphetamines 09/09/2021 NONE DETECTED  NONE DETECTED Final   Tetrahydrocannabinol 09/09/2021 NONE DETECTED  NONE DETECTED Final   Barbiturates 09/09/2021 NONE DETECTED  NONE DETECTED Final   Comment: (NOTE) DRUG SCREEN FOR MEDICAL PURPOSES ONLY.  IF CONFIRMATION IS NEEDED FOR ANY PURPOSE, NOTIFY LAB WITHIN 5 DAYS.  LOWEST DETECTABLE LIMITS FOR URINE DRUG SCREEN Drug Class                     Cutoff (ng/mL) Amphetamine and metabolites    1000 Barbiturate and metabolites    200 Benzodiazepine                 200 Tricyclics and metabolites     300 Opiates and metabolites        300 Cocaine and  metabolites        300 THC                            50 Performed at [facility], [address] [place], [place] [address]    Alcohol, Ethyl (B) 09/09/2021 <10  <10 mg/dL Final   Comment: (NOTE) Lowest detectable limit for serum alcohol is 10 mg/dL.  For medical purposes only. Performed at [facility], [address] [place], [place] [address]    Lipase 09/09/2021 33  11 - 51 U/L Final   Performed at [facility], [address], [place], [place] [address]   Color, Urine 09/09/2021 COLORLESS (A)  YELLOW Final   APPearance 09/09/2021 CLEAR  CLEAR Final   Specific Gravity, Urine 09/09/2021 1.002 (L)  1.005 - 1.030 Final   pH 09/09/2021 6.0  5.0 - 8.0 Final   Glucose, UA 09/09/2021 NEGATIVE  NEGATIVE mg/dL Final   Hgb urine dipstick 09/09/2021 NEGATIVE  NEGATIVE Final   Bilirubin Urine 09/09/2021 NEGATIVE  NEGATIVE Final   Ketones, ur 09/09/2021 NEGATIVE  NEGATIVE mg/dL Final   Protein, ur 82/42/3536 NEGATIVE  NEGATIVE mg/dL Final   Nitrite 14/43/1540 NEGATIVE  NEGATIVE Final   Leukocytes,Ua 09/09/2021 NEGATIVE  NEGATIVE Final   Performed at [facility], [address] [place], [place] [address]   Specimen Description 09/09/2021 URINE, CLEAN CATCH   Final   Special Requests 09/09/2021 NONE   Final   Culture 09/09/2021  (A)   Final                   Value:<10,000 COLONIES/mL INSIGNIFICANT GROWTH Performed at [facility], [address] [place], [place] [address]    Report Status 09/09/2021 09/10/2021 FINAL   Final   D-Dimer, Quant 09/09/2021 0.92 (H)  0.00 - 0.50 ug/mL-FEU Final   Comment: (NOTE) At the manufacturer cut-off value of  0.5 g/mL FEU, this assay has a negative predictive value of 95-100%.This assay is intended for use in conjunction with a clinical pretest probability (PTP) assessment model to exclude pulmonary embolism (PE) and deep venous thrombosis (DVT) in outpatients suspected of PE or DVT. Results should be correlated  with clinical presentation. Performed at [facility], [place] [address], [place], [place] [address]    Troponin I (High Sensitivity) 09/09/2021 5  <18 ng/L Final   Comment: (NOTE) Elevated high sensitivity troponin I (hsTnI) values and significant  changes across serial measurements may suggest ACS but many other  chronic and acute conditions are known to elevate hsTnI results.  Refer to the "Links" section for chest pain algorithms and additional  guidance. Performed at [facility], [place] [address] [place], [place] [address]    BP 09/10/2021 135/83  mmHg Final   S' Lateral 09/10/2021 2.30  cm Final   Area-P 1/2 09/10/2021 5.58  cm2 Final    Blood Alcohol level:  Lab Results  Component Value Date   ETH <10 09/09/2021   ETH <10 [id number]    Metabolic Disorder Labs: Lab Results  Component Value Date   HGBA1C 7.0 (H) 10/04/2021   MPG 154.2 10/04/2021   No results found for: "PROLACTIN" Lab Results  Component Value Date   CHOL 178 10/04/2021   TRIG 155 (H) 10/04/2021   HDL 52 10/04/2021   CHOLHDL 3.4 10/04/2021   VLDL 31 10/04/2021   [place] 95 10/04/2021    Therapeutic Lab Levels: No results found for: "LITHIUM" Lab Results  Component Value Date   VALPROATE 57 10/08/2021   VALPROATE 51 10/04/2021   No results found for: "CBMZ"  Physical Findings   Flowsheet Row ED from 10/04/2021 in [facility] ED from 09/09/2021 in [facility] from 08/22/2021 in [facility]  [place] No Risk No Risk No Risk        Musculoskeletal  Strength & Muscle Tone: within normal limits Gait & Station: normal Patient leans: N/A  Psychiatric Specialty Exam  Presentation  General Appearance: Appropriate for Environment  Eye Contact:Good  Speech:Clear and Coherent  Speech Volume:Normal  Handedness:Right   Mood and Affect   Mood:Euthymic  Affect:Constricted   Thought Process  Thought Processes:Coherent; Linear  Descriptions of Associations:Intact  Orientation:Full (Time, Place and Person)  Thought Content:Logical  Diagnosis of Schizophrenia or Schizoaffective disorder in past: Yes  Duration of Psychotic Symptoms: Greater than six months   Hallucinations:No data recorded  Ideas of Reference:None  Suicidal Thoughts: denies  Homicidal Thoughts: denies   Sensorium  Memory:Immediate Fair; Recent Fair; Remote Fair  Judgment:Poor  Insight:Poor   Executive Functions  Concentration:Poor  Attention Span:Poor  Recall:Poor  Fund of Knowledge:Poor  Language:Poor   Psychomotor Activity  Psychomotor Activity: normal   Assets  Assets:Communication Skills; Desire for Improvement   Sleep  Sleep: fair   Physical Exam  Physical Exam Constitutional:      Appearance: the patient is not toxic-appearing.  Pulmonary:     Effort: Pulmonary effort is normal.  Neurological:     General: No focal deficit present.     Mental Status: the patient is alert and oriented to person, place, and time.   Review of Systems  Respiratory:  Negative for shortness of breath.   Cardiovascular:  Negative for chest pain.  Gastrointestinal:  Negative for abdominal pain, constipation, diarrhea, nausea and vomiting.  Neurological:  Negative for headaches.  Blood pressure 109/68, pulse 100, temperature (!) 97.5 F (36.4 C), temperature source Oral, resp. rate 20, SpO2 100 %. There is no height or weight on file to calculate BMI.  Treatment Plan Summary: Daily contact with patient to assess and evaluate symptoms and progress in treatment and Medication management  Status: Voluntary Disposition: TBD, difficult to place given ID diagnosis and schizophrenia with aggression -LCSW working on disposition planning  Hx of schizophrenia and aggression Continue home medications as listed - Discontinued Haldol  10 mg 3 times daily because this is not currently being prescribed by her outpatient provider -- Continue Zyprexa 20 mg QHS (in place of Lybalvi) - Continue Cogentin 1 mg twice daily - Continue Clozapine 50 mg twice daily, ACTT confirms patient is prescribed this medicine - Ingrezza 40 mg daily - Depakote extended release 500 mg twice daily    Labs: VPA trough of 51 8/6, 57 on 8/10 UDS with oxy A1C of 7 Hgb of 11.6 MCV of 80, will need outpatient follow up   Corky Sox, MD 10/15/2021 4:26 PM

## 2021-10-15 NOTE — BH Assessment (Signed)
LCSW Progress Note   0844 - LCSW attempted to contact Ms.Chilton Si, [phone] @ [facility].  Left a voicemail requesting a call back.    Hansel Starling, MSW, LCSW [facility] [phone] phone

## 2021-10-15 NOTE — ED Notes (Signed)
Pt awake and alert at this hour. No apparent distress. RR even and unlabored. Monitored for safety.

## 2021-10-15 NOTE — ED Notes (Signed)
Pt sleeping@this time. Breathing even and unlabored. Will continue to monitor for safety 

## 2021-10-16 NOTE — ED Notes (Signed)
Pt laying on bed watching tv in no acute distress. Informed pt to notify staff with any needs or concerns. Will continue to monitor for safety.

## 2021-10-16 NOTE — BH Assessment (Addendum)
LCSW Progress Note   0846 - Dolan Amen from [facility] contacted LCSW for an update.  LCSW was requested to assist the pt in staring her register to be identified within the MCO/LME as IDD. This would open up resources through [employer] and [employer].  LCSW was informed that Ms. Eddie Dibbles is requesting to speak with LCSW.  Ms. Nyoka Cowden stated that she placed more calls yesterday to [facility] and is awaiting call backs.  1528 - LCSW met with pt to assist her in beginning the process to become IDD eligible for [employer] through [employer].  Pt and LCSW spoke with "Kerry Dory" on the Member/Recipient Service Line, [phone].  We were instructed that the pt would need to supply recent clinical documentation showing she has an IDD diagnosis.  This documentation is with her previous [facility] provider, Ms. Eddie Dibbles at [facility].  LCSW was given the phone number and email to have the documentation sent for upload in the [facility] system.  [phone] fax [email or web address]  LCSW learned that pt also has a housing coordinator, Beckie Salts, [phone].   Omelia Blackwater, MSW, [facility] phone

## 2021-10-16 NOTE — ED Notes (Signed)
Pt asleep at this hour. No apparent distress. RR even and unlabored. Monitored for safety.  

## 2021-10-16 NOTE — ED Notes (Signed)
Pt sleeping in no acute distress. RR even and unlabored. Safety maintained. 

## 2021-10-16 NOTE — BH Assessment (Signed)
LCSW Progress Note   0844 - LCSW attempted to contact pt's care coordinator, Mariea Clonts, [phone], for update on placement.  Left a message requesting a call back.  1222 Mariea Clonts from [facility] returned phone call with an update.  She spoke with Ms. [name] about additional supports and is following up on other AFL homes that she identified as appropriate placements.  She stated once again that Ms. [name] has been in contact with DHHS/DHSR regarding what stipulations there are regarding the pt coming back for a temporary stay so that the facility's license is not jeopardized.  Dr. Jerrel Ivory was present for the phone call.   Hansel Starling, MSW, LCSW [facility] [phone] phone

## 2021-10-16 NOTE — ED Notes (Signed)
Patient A&Ox4. Denies intent to harm self/others when asked. Denies A/VH. Patient denies any physical complaints when asked. No acute distress noted. Routine safety checks conducted according to facility protocol. Encouraged patient to notify staff if thoughts of harm toward self or others arise. Patient verbalize understanding and agreement. Will continue to monitor for safety.    

## 2021-10-16 NOTE — ED Provider Notes (Signed)
Behavioral Health Progress Note  Date and Time: 10/16/2021 12:57 PM Name: Ronald Vinsant MRN:  [id number]  Subjective:   Teresa Murillo is a 61 year old female with a past psychiatric history of schizophrenia, aggressive behavior, and possible intellectual disability.  She presented to the [facility] behavioral health urgent care after an argument with staff members at her group home that resulted in her signing discharge paperwork and leaving.  Per previous documentation, the group home staff members have reported that the patient has a history of intellectual disability.  Cannot find this documented elsewhere in the chart.  It is certainly possible, as the patient is unable to state any of her home medications and is unable to perform simple math problems.  The patient reports that she receives a disability check each month. The patient is connected with [facility] but does not receive ACT services.  The patient has a care coordinator with [facility] named Ms. Green, with whom the LCSW has frequently been communicating with to determine proper disposition for the patient.   On assessment this morning, the patient is calm and cooperative.  Per nursing documentation the patient has also been calm and cooperative.  Presently she does not appear to be psychotic and she does not admit to any auditory or visual hallucinations.  She denies suicidal or homicidal thoughts.  She reports appropriate mood, sleep, and appetite.   Per LCSW: Mariea Clonts from [facility] contacted LCSW for an update.  LCSW was requested to assist the pt in staring her register to be identified within the MCO/LME as IDD. This would open up resources through [employer] and [employer].  LCSW was informed that Ms. [name] is requesting to speak with LCSW.  Ms. Chilton Si stated that she placed more calls yesterday to [facility] and is awaiting call backs.     Diagnosis:  Final diagnoses:  Schizophrenia, unspecified  type (HCC)    Total Time spent with patient: 15 minutes  Past Psychiatric History: as above Past Medical History:  Past Medical History:  Diagnosis Date   Chronic obstructive pulmonary disease (COPD) (HCC)    Glaucoma    Hyperlipidemia    Hypertension    Iron deficiency    Schizoaffective disorder (HCC)    Type 2 diabetes mellitus (HCC)     Past Surgical History:  Procedure Laterality Date   TUBAL LIGATION     Family History:  Family History  Problem Relation Age of Onset   Breast cancer Maternal Grandmother    Family Psychiatric  History: as per H and P Social History:  Social History   Substance and Sexual Activity  Alcohol Use Yes     Social History   Substance and Sexual Activity  Drug Use Not Currently    Social History   Socioeconomic History   Marital status: Divorced    Spouse name: Not on file   Number of children: Not on file   Years of education: Not on file   Highest education level: Not on file  Occupational History   Not on file  Tobacco Use   Smoking status: Every Day    Packs/day: 1.00    Types: Cigars, Cigarettes   Smokeless tobacco: Current  Vaping Use   Vaping Use: Never used  Substance and Sexual Activity   Alcohol use: Yes   Drug use: Not Currently   Sexual activity: Not Currently    Birth control/protection: Surgical  Other Topics Concern   Not on file  Social  History Narrative   Not on file   Social Determinants of Health   Financial Resource Strain: Not on file  Food Insecurity: Not on file  Transportation Needs: Not on file  Physical Activity: Not on file  Stress: Not on file  Social Connections: Not on file   SDOH:  SDOH Screenings   Alcohol Screen: Not on file  Depression (PHQ2-9): Not on file  Financial Resource Strain: Not on file  Food Insecurity: Not on file  Housing: Not on file  Physical Activity: Not on file  Social Connections: Not on file  Stress: Not on file  Tobacco Use: High Risk (09/09/2021)    Patient History    Smoking Tobacco Use: Every Day    Smokeless Tobacco Use: Current    Passive Exposure: Not on file  Transportation Needs: Not on file   Additional Social History:    Pain Medications: See MAR Prescriptions: See MAR Over the Counter: See MAR History of alcohol / drug use?: No history of alcohol / drug abuse Longest period of sobriety (when/how long): N/A Negative Consequences of Use:  (N/A) Withdrawal Symptoms:  (N/A)                    Sleep: Fair  Appetite:  Fair  Current Medications:  Current Facility-Administered Medications  Medication Dose Route Frequency Provider Last Rate Last Admin   acetaminophen (TYLENOL) tablet 650 mg  650 mg Oral Q6H PRN Lauree Chandler, NP   650 mg at 10/15/21 1123   alum & mag hydroxide-simeth (MAALOX/MYLANTA) 200-200-20 MG/5ML suspension 30 mL  30 mL Oral Q4H PRN Lauree Chandler, NP       benztropine (COGENTIN) tablet 1 mg  1 mg Oral BID Lauree Chandler, NP   1 mg at 10/16/21 0915   cloZAPine (CLOZARIL) tablet 50 mg  50 mg Oral BID Lauree Chandler, NP   50 mg at 10/16/21 0915   divalproex (DEPAKOTE ER) 24 hr tablet 500 mg  500 mg Oral BID Lauree Chandler, NP   500 mg at 10/16/21 0915   hydrOXYzine (ATARAX) tablet 50 mg  50 mg Oral TID PRN Carlyn Reichert, MD   50 mg at 10/14/21 1416   latanoprost (XALATAN) 0.005 % ophthalmic solution 1 drop  1 drop Both Eyes QHS Lauree Chandler, NP   1 drop at 10/15/21 2214   lisinopril (ZESTRIL) tablet 10 mg  10 mg Oral Daily Lauree Chandler, NP   10 mg at 10/16/21 0914   magnesium hydroxide (MILK OF MAGNESIA) suspension 30 mL  30 mL Oral Daily PRN Lauree Chandler, NP       metFORMIN (GLUCOPHAGE) tablet 500 mg  500 mg Oral BID WC Lauree Chandler, NP   500 mg at 10/16/21 0900   mometasone-formoterol (DULERA) 200-5 MCG/ACT inhaler 2 puff  2 puff Inhalation BID Lauree Chandler, NP   2 puff at 10/16/21 0900   nicotine (NICODERM CQ - dosed in mg/24 hr)  patch 7 mg  7 mg Transdermal Daily Lauree Chandler, NP   7 mg at 10/16/21 0915   OLANZapine (ZYPREXA) tablet 20 mg  20 mg Oral QHS Carlyn Reichert, MD   20 mg at 10/15/21 2212   valbenazine (INGREZZA) capsule 40 mg  40 mg Oral q AM Lauree Chandler, NP   40 mg at 10/16/21 8657   Current Outpatient Medications  Medication Sig Dispense Refill   benztropine (COGENTIN) 1 MG tablet Take 1 mg  by mouth in the morning and at bedtime.     budesonide-formoterol (SYMBICORT) 160-4.5 MCG/ACT inhaler Inhale 2 puffs into the lungs in the morning and at bedtime.     Cholecalciferol (VITAMIN D3) 1.25 MG (50000 UT) CAPS Take 50,000 Units by mouth every Thursday.     clozapine (CLOZARIL) 50 MG tablet Take 50 mg by mouth 2 (two) times daily.     divalproex (DEPAKOTE ER) 500 MG 24 hr tablet Take 500 mg by mouth in the morning and at bedtime.     haloperidol (HALDOL) 10 MG tablet Take 10 mg by mouth 3 (three) times daily.     INGREZZA 40 MG capsule Take 40 mg by mouth in the morning.     latanoprost (XALATAN) 0.005 % ophthalmic solution Place 1 drop into both eyes at bedtime.     lisinopril (ZESTRIL) 10 MG tablet Take 10 mg by mouth daily.     metFORMIN (GLUCOPHAGE) 500 MG tablet Take 500 mg by mouth 2 (two) times daily with a meal.     nicotine (NICODERM CQ - DOSED IN MG/24 HR) 7 mg/24hr patch Place 7 mg onto the skin daily.     OLANZapine-Samidorphan (LYBALVI) 20-10 MG TABS Take 1 tablet by mouth at bedtime.     PROAIR HFA 108 (90 Base) MCG/ACT inhaler Inhale 2 puffs into the lungs every 6 (six) hours as needed for wheezing or shortness of breath.     zolpidem (AMBIEN) 10 MG tablet Take 10 mg by mouth at bedtime.      Labs  Lab Results:  Admission on 10/04/2021  Component Date Value Ref Range Status   SARS Coronavirus 2 by RT PCR 10/04/2021 NEGATIVE  NEGATIVE Final   Comment: (NOTE) SARS-CoV-2 target nucleic acids are NOT DETECTED.  The SARS-CoV-2 RNA is generally detectable in upper  respiratory specimens during the acute phase of infection. The lowest concentration of SARS-CoV-2 viral copies this assay can detect is 138 copies/mL. A negative result does not preclude SARS-Cov-2 infection and should not be used as the sole basis for treatment or other patient management decisions. A negative result may occur with  improper specimen collection/handling, submission of specimen other than nasopharyngeal swab, presence of viral mutation(s) within the areas targeted by this assay, and inadequate number of viral copies(<138 copies/mL). A negative result must be combined with clinical observations, patient history, and epidemiological information. The expected result is Negative.  Fact Sheet for Patients:  BloggerCourse.com  Fact Sheet for Healthcare Providers:  SeriousBroker.it  This test is no                          t yet approved or cleared by the [place] FDA and  has been authorized for detection and/or diagnosis of SARS-CoV-2 by FDA under an Emergency Use Authorization (EUA). This EUA will remain  in effect (meaning this test can be used) for the duration of the COVID-19 declaration under Section 564(b)(1) of the Act, 21 U.S.C.section 360bbb-3(b)(1), unless the authorization is terminated  or revoked sooner.       Influenza A by PCR 10/04/2021 NEGATIVE  NEGATIVE Final   Influenza B by PCR 10/04/2021 NEGATIVE  NEGATIVE Final   Comment: (NOTE) The Xpert Xpress SARS-CoV-2/FLU/RSV plus assay is intended as an aid in the diagnosis of influenza from Nasopharyngeal swab specimens and should not be used as a sole basis for treatment. Nasal washings and aspirates are unacceptable for Xpert Xpress SARS-CoV-2/FLU/RSV testing.  Fact  Sheet for Patients: BloggerCourse.com  Fact Sheet for Healthcare Providers: SeriousBroker.it  This test is not yet approved or  cleared by the [place] FDA and has been authorized for detection and/or diagnosis of SARS-CoV-2 by FDA under an Emergency Use Authorization (EUA). This EUA will remain in effect (meaning this test can be used) for the duration of the COVID-19 declaration under Section 564(b)(1) of the Act, 21 U.S.C. section 360bbb-3(b)(1), unless the authorization is terminated or revoked.  Performed at [facility] Lab, [address], [place], [place] [address]    WBC 10/04/2021 8.4  4.0 - 10.5 K/uL Final   RBC 10/04/2021 4.42  3.87 - 5.11 MIL/uL Final   Hemoglobin 10/04/2021 11.6 (L)  12.0 - 15.0 g/dL Final   HCT 38/75/6433 35.7 (L)  36.0 - 46.0 % Final   MCV 10/04/2021 80.8  80.0 - 100.0 fL Final   MCH 10/04/2021 26.2  26.0 - 34.0 pg Final   MCHC 10/04/2021 32.5  30.0 - 36.0 g/dL Final   RDW 29/51/8841 16.0 (H)  11.5 - 15.5 % Final   Platelets 10/04/2021 372  150 - 400 K/uL Final   nRBC 10/04/2021 0.0  0.0 - 0.2 % Final   Neutrophils Relative % 10/04/2021 68  % Final   Neutro Abs 10/04/2021 5.7  1.7 - 7.7 K/uL Final   Lymphocytes Relative 10/04/2021 22  % Final   Lymphs Abs 10/04/2021 1.8  0.7 - 4.0 K/uL Final   Monocytes Relative 10/04/2021 8  % Final   Monocytes Absolute 10/04/2021 0.7  0.1 - 1.0 K/uL Final   Eosinophils Relative 10/04/2021 1  % Final   Eosinophils Absolute 10/04/2021 0.1  0.0 - 0.5 K/uL Final   Basophils Relative 10/04/2021 1  % Final   Basophils Absolute 10/04/2021 0.1  0.0 - 0.1 K/uL Final   Immature Granulocytes 10/04/2021 0  % Final   Abs Immature Granulocytes 10/04/2021 0.03  0.00 - 0.07 K/uL Final   Performed at [facility] Lab, [address] [place], [place] [address]   Sodium 10/04/2021 136  135 - 145 mmol/L Final   Potassium 10/04/2021 4.2  3.5 - 5.1 mmol/L Final   Chloride 10/04/2021 104  98 - 111 mmol/L Final   CO2 10/04/2021 25  22 - 32 mmol/L Final   Glucose, Bld 10/04/2021 100 (H)  70 - 99 mg/dL Final   Glucose reference range applies only to  samples taken after fasting for at least 8 hours.   BUN 10/04/2021 9  6 - 20 mg/dL Final   Creatinine, Ser 10/04/2021 0.52  0.44 - 1.00 mg/dL Final   Calcium 01/60/1093 9.0  8.9 - 10.3 mg/dL Final   Total Protein 23/55/7322 7.0  6.5 - 8.1 g/dL Final   Albumin 02/54/2706 3.2 (L)  3.5 - 5.0 g/dL Final   AST 23/76/2831 13 (L)  15 - 41 U/L Final   ALT 10/04/2021 10  0 - 44 U/L Final   Alkaline Phosphatase 10/04/2021 61  38 - 126 U/L Final   Total Bilirubin 10/04/2021 0.3  0.3 - 1.2 mg/dL Final   GFR, Estimated 10/04/2021 >60  >60 mL/min Final   Comment: (NOTE) Calculated using the CKD-EPI Creatinine Equation (2021)    Anion gap 10/04/2021 7  5 - 15 Final   Performed at [facility] Lab, [address], [place], [place] [address]   Hgb A1c MFr Bld 10/04/2021 7.0 (H)  4.8 - 5.6 % Final   Comment: (NOTE) Pre diabetes:  5.7%-6.4%  Diabetes:              >6.4%  Glycemic control for   <7.0% adults with diabetes    Mean Plasma Glucose 10/04/2021 154.2  mg/dL Final   Performed at [facility] Lab, [address] [place], [place] [address]   Cholesterol 10/04/2021 178  0 - 200 mg/dL Final   Triglycerides 60/45/4098 155 (H)  <150 mg/dL Final   HDL 11/91/4782 52  >40 mg/dL Final   Total CHOL/HDL Ratio 10/04/2021 3.4  RATIO Final   VLDL 10/04/2021 31  0 - 40 mg/dL Final   LDL Cholesterol 10/04/2021 95  0 - 99 mg/dL Final   Comment:        Total Cholesterol/HDL:CHD Risk Coronary Heart Disease Risk Table                     Men   Women  1/2 Average Risk   3.4   3.3  Average Risk       5.0   4.4  2 X Average Risk   9.6   7.1  3 X Average Risk  23.4   11.0        Use the calculated Patient Ratio above and the CHD Risk Table to determine the patient's CHD Risk.        ATP III CLASSIFICATION (LDL):  <100     mg/dL   Optimal  956-213  mg/dL   Near or Above                    Optimal  130-159  mg/dL   Borderline  086-578  mg/dL   High  >469     mg/dL   Very High Performed at  [facility] Lab, [address], [place], [place] [address]    POC Amphetamine UR 10/04/2021 None Detected  NONE DETECTED (Cut Off Level 1000 ng/mL) Final   POC Secobarbital (BAR) 10/04/2021 None Detected  NONE DETECTED (Cut Off Level 300 ng/mL) Final   POC Buprenorphine (BUP) 10/04/2021 None Detected  NONE DETECTED (Cut Off Level 10 ng/mL) Final   POC Oxazepam (BZO) 10/04/2021 None Detected  NONE DETECTED (Cut Off Level 300 ng/mL) Final   POC Cocaine UR 10/04/2021 None Detected  NONE DETECTED (Cut Off Level 300 ng/mL) Final   POC Methamphetamine UR 10/04/2021 None Detected  NONE DETECTED (Cut Off Level 1000 ng/mL) Final   POC Morphine 10/04/2021 None Detected  NONE DETECTED (Cut Off Level 300 ng/mL) Final   POC Methadone UR 10/04/2021 None Detected  NONE DETECTED (Cut Off Level 300 ng/mL) Final   POC Oxycodone UR 10/04/2021 Positive (A)  NONE DETECTED (Cut Off Level 100 ng/mL) Final   POC Marijuana UR 10/04/2021 None Detected  NONE DETECTED (Cut Off Level 50 ng/mL) Final   SARSCOV2ONAVIRUS 2 AG 10/04/2021 NEGATIVE  NEGATIVE Final   Comment: (NOTE) SARS-CoV-2 antigen NOT DETECTED.   Negative results are presumptive.  Negative results do not preclude SARS-CoV-2 infection and should not be used as the sole basis for treatment or other patient management decisions, including infection  control decisions, particularly in the presence of clinical signs and  symptoms consistent with COVID-19, or in those who have been in contact with the virus.  Negative results must be combined with clinical observations, patient history, and epidemiological information. The expected result is Negative.  Fact Sheet for Patients: https://www.jennings-kim.com/  Fact Sheet for Healthcare Providers: https://alexander-rogers.biz/  This test is not yet  approved or cleared by the [place] and  has been authorized for detection and/or diagnosis of SARS-CoV-2 by FDA under an  Emergency Use Authorization (EUA).  This EUA will remain in effect (meaning this test can be used) for the duration of  the COV                          ID-19 declaration under Section 564(b)(1) of the Act, 21 U.S.C. section 360bbb-3(b)(1), unless the authorization is terminated or revoked sooner.     Valproic Acid Lvl 10/04/2021 51  50.0 - 100.0 ug/mL Final   Performed at [facility], [address], [place], [place] [address]   Valproic Acid Lvl 10/08/2021 57  50.0 - 100.0 ug/mL Final   Performed at [facility], [address] [place], [place] [address]   Glucose-Capillary 10/09/2021 123 (H)  70 - 99 mg/dL Final   Glucose reference range applies only to samples taken after fasting for at least 8 hours.  Admission on 09/09/2021, Discharged on 09/10/2021  Component Date Value Ref Range Status   Sodium 09/09/2021 134 (L)  135 - 145 mmol/L Final   Potassium 09/09/2021 4.3  3.5 - 5.1 mmol/L Final   Chloride 09/09/2021 99  98 - 111 mmol/L Final   CO2 09/09/2021 25  22 - 32 mmol/L Final   Glucose, Bld 09/09/2021 107 (H)  70 - 99 mg/dL Final   Glucose reference range applies only to samples taken after fasting for at least 8 hours.   BUN 09/09/2021 12  6 - 20 mg/dL Final   Creatinine, Ser 09/09/2021 0.74  0.44 - 1.00 mg/dL Final   Calcium 40/11/2723 9.0  8.9 - 10.3 mg/dL Final   Total Protein 36/64/4034 7.4  6.5 - 8.1 g/dL Final   Albumin 74/25/9563 3.1 (L)  3.5 - 5.0 g/dL Final   AST 87/56/4332 13 (L)  15 - 41 U/L Final   ALT 09/09/2021 13  0 - 44 U/L Final   Alkaline Phosphatase 09/09/2021 66  38 - 126 U/L Final   Total Bilirubin 09/09/2021 0.2 (L)  0.3 - 1.2 mg/dL Final   GFR, Estimated 09/09/2021 >60  >60 mL/min Final   Comment: (NOTE) Calculated using the CKD-EPI Creatinine Equation (2021)    Anion gap 09/09/2021 10  5 - 15 Final   Performed at [facility] Lab, [address], [place], [place] [address]   WBC 09/09/2021 8.5  4.0 - 10.5 K/uL Final   RBC  09/09/2021 4.53  3.87 - 5.11 MIL/uL Final   Hemoglobin 09/09/2021 11.8 (L)  12.0 - 15.0 g/dL Final   HCT 41/66/0630 38.0  36.0 - 46.0 % Final   MCV 09/09/2021 83.9  80.0 - 100.0 fL Final   MCH 09/09/2021 26.0  26.0 - 34.0 pg Final   MCHC 09/09/2021 31.1  30.0 - 36.0 g/dL Final   RDW 16/03/930 17.8 (H)  11.5 - 15.5 % Final   Platelets 09/09/2021 442 (H)  150 - 400 K/uL Final   nRBC 09/09/2021 0.0  0.0 - 0.2 % Final   Neutrophils Relative % 09/09/2021 70  % Final   Neutro Abs 09/09/2021 5.9  1.7 - 7.7 K/uL Final   Lymphocytes Relative 09/09/2021 20  % Final   Lymphs Abs 09/09/2021 1.7  0.7 - 4.0 K/uL Final   Monocytes Relative 09/09/2021 8  % Final   Monocytes Absolute 09/09/2021 0.7  0.1 - 1.0 K/uL  Final   Eosinophils Relative 09/09/2021 1  % Final   Eosinophils Absolute 09/09/2021 0.1  0.0 - 0.5 K/uL Final   Basophils Relative 09/09/2021 1  % Final   Basophils Absolute 09/09/2021 0.1  0.0 - 0.1 K/uL Final   Immature Granulocytes 09/09/2021 0  % Final   Abs Immature Granulocytes 09/09/2021 0.03  0.00 - 0.07 K/uL Final   Performed at [facility], [address] [place], [place] [address]   Ammonia 09/09/2021 37 (H)  9 - 35 umol/L Final   Comment: HEMOLYSIS AT THIS LEVEL MAY AFFECT RESULT Performed at [facility], [address] [place], [place] [address]    Opiates 09/09/2021 NONE DETECTED  NONE DETECTED Final   Cocaine 09/09/2021 NONE DETECTED  NONE DETECTED Final   Benzodiazepines 09/09/2021 NONE DETECTED  NONE DETECTED Final   Amphetamines 09/09/2021 NONE DETECTED  NONE DETECTED Final   Tetrahydrocannabinol 09/09/2021 NONE DETECTED  NONE DETECTED Final   Barbiturates 09/09/2021 NONE DETECTED  NONE DETECTED Final   Comment: (NOTE) DRUG SCREEN FOR MEDICAL PURPOSES ONLY.  IF CONFIRMATION IS NEEDED FOR ANY PURPOSE, NOTIFY LAB WITHIN 5 DAYS.  LOWEST DETECTABLE LIMITS FOR URINE DRUG SCREEN Drug Class                     Cutoff (ng/mL) Amphetamine and metabolites     1000 Barbiturate and metabolites    200 Benzodiazepine                 200 Tricyclics and metabolites     300 Opiates and metabolites        300 Cocaine and metabolites        300 THC                            50 Performed at [facility], [address] [place], [place] [address]    Alcohol, Ethyl (B) 09/09/2021 <10  <10 mg/dL Final   Comment: (NOTE) Lowest detectable limit for serum alcohol is 10 mg/dL.  For medical purposes only. Performed at [facility], [address] [place], [place] [address]    Lipase 09/09/2021 33  11 - 51 U/L Final   Performed at [facility], [address] [place], [place] [address]   Color, Urine 09/09/2021 COLORLESS (A)  YELLOW Final   APPearance 09/09/2021 CLEAR  CLEAR Final   Specific Gravity, Urine 09/09/2021 1.002 (L)  1.005 - 1.030 Final   pH 09/09/2021 6.0  5.0 - 8.0 Final   Glucose, UA 09/09/2021 NEGATIVE  NEGATIVE mg/dL Final   Hgb urine dipstick 09/09/2021 NEGATIVE  NEGATIVE Final   Bilirubin Urine 09/09/2021 NEGATIVE  NEGATIVE Final   Ketones, ur 09/09/2021 NEGATIVE  NEGATIVE mg/dL Final   Protein, ur 13/08/657807/01/2022 NEGATIVE  NEGATIVE mg/dL Final   Nitrite 46/96/295207/01/2022 NEGATIVE  NEGATIVE Final   Leukocytes,Ua 09/09/2021 NEGATIVE  NEGATIVE Final   Performed at [facility], [address] [place], [place] [address]   Specimen Description 09/09/2021 URINE, CLEAN CATCH   Final   Special Requests 09/09/2021 NONE   Final   Culture 09/09/2021  (A)   Final                   Value:<10,000 COLONIES/mL INSIGNIFICANT GROWTH Performed at [facility], [address] [place], [place] [address]    Report Status 09/09/2021 09/10/2021 FINAL  Final   D-Dimer, Quant 09/09/2021 0.92 (H)  0.00 - 0.50 ug/mL-FEU Final   Comment: (NOTE) At the manufacturer cut-off value of 0.5 g/mL FEU, this assay has a negative predictive value of 95-100%.This assay is intended for use in conjunction with a clinical pretest probability  (PTP) assessment model to exclude pulmonary embolism (PE) and deep venous thrombosis (DVT) in outpatients suspected of PE or DVT. Results should be correlated with clinical presentation. Performed at [facility], [address], [place], [place] [address]    Troponin I (High Sensitivity) 09/09/2021 5  <18 ng/L Final   Comment: (NOTE) Elevated high sensitivity troponin I (hsTnI) values and significant  changes across serial measurements may suggest ACS but many other  chronic and acute conditions are known to elevate hsTnI results.  Refer to the "Links" section for chest pain algorithms and additional  guidance. Performed at [facility], [address] [place], [place] [address]    BP 09/10/2021 135/83  mmHg Final   S' Lateral 09/10/2021 2.30  cm Final   Area-P 1/2 09/10/2021 5.58  cm2 Final    Blood Alcohol level:  Lab Results  Component Value Date   ETH <10 09/09/2021   ETH <10 10/20/2020    Metabolic Disorder Labs: Lab Results  Component Value Date   HGBA1C 7.0 (H) 10/04/2021   MPG 154.2 10/04/2021   No results found for: "PROLACTIN" Lab Results  Component Value Date   CHOL 178 10/04/2021   TRIG 155 (H) 10/04/2021   HDL 52 10/04/2021   CHOLHDL 3.4 10/04/2021   VLDL 31 10/04/2021   LDLCALC 95 10/04/2021    Therapeutic Lab Levels: No results found for: "LITHIUM" Lab Results  Component Value Date   VALPROATE 57 10/08/2021   VALPROATE 51 10/04/2021   No results found for: "CBMZ"  Physical Findings   Flowsheet Row ED from 10/04/2021 in [facility] ED from 09/09/2021 in [facility] EMERGENCY DEPARTMENT ED from 08/22/2021 in [facility] Dept  C-SSRS RISK CATEGORY No Risk No Risk No Risk        Musculoskeletal  Strength & Muscle Tone: within normal limits Gait & Station: normal Patient leans: N/A  Psychiatric Specialty Exam  Presentation  General Appearance: Appropriate for  Environment  Eye Contact:Good  Speech:Clear and Coherent  Speech Volume:Normal  Handedness:Right   Mood and Affect  Mood:Euthymic  Affect:Constricted   Thought Process  Thought Processes:Coherent; Linear  Descriptions of Associations:Intact  Orientation:Full (Time, Place and Person)  Thought Content:Logical  Diagnosis of Schizophrenia or Schizoaffective disorder in past: Yes  Duration of Psychotic Symptoms: Greater than six months   Hallucinations: denies  Ideas of Reference:None  Suicidal Thoughts: denies  Homicidal Thoughts: denies   Sensorium  Memory:Immediate Fair; Recent Fair; Remote Fair  Judgment:Poor  Insight:Poor   Executive Functions  Concentration:Poor  Attention Span:Poor  Recall:Poor  Fund of Knowledge:Poor  Language:Poor   Psychomotor Activity  Psychomotor Activity: normal   Assets  Assets:Communication Skills; Desire for Improvement   Sleep  Sleep: fair   Physical Exam  Physical Exam Constitutional:      Appearance: the patient is not toxic-appearing.  Pulmonary:     Effort: Pulmonary effort is normal.  Neurological:     General: No focal deficit present.     Mental Status: the patient is alert and oriented to person, place, and time.   Review of Systems  Respiratory:  Negative for shortness of breath.   Cardiovascular:  Negative for chest pain.  Gastrointestinal:  Negative for abdominal pain, constipation, diarrhea, nausea and vomiting.  Neurological:  Negative for headaches.   Blood pressure 99/66, pulse 100, temperature 98 F (36.7 C), temperature source Oral, resp. rate 17, SpO2 100 %. There is no height or weight on file to calculate BMI.  Treatment Plan Summary: Daily contact with patient to assess and evaluate symptoms and progress in treatment and Medication management  Status: Voluntary Disposition: TBD, difficult to place given ID diagnosis and schizophrenia with aggression -LCSW working on  disposition planning  Hx of schizophrenia and aggression Continue home medications as listed - Discontinued Haldol 10 mg 3 times daily because this is not currently being prescribed by her outpatient provider -- Continue Zyprexa 20 mg QHS (in place of Lybalvi) - Continue Cogentin 1 mg twice daily - Continue Clozapine 50 mg twice daily, ACTT confirms patient is prescribed this medicine - Ingrezza 40 mg daily - Depakote extended release 500 mg twice daily    Labs: VPA trough of 51 8/6, 57 on 8/10 UDS with oxy A1C of 7 Hgb of 11.6 MCV of 80, will need outpatient follow up   Carlyn Reichert, MD 10/16/2021 12:57 PM

## 2021-10-16 NOTE — ED Notes (Signed)
Pt sitting on her bed calm and cooperative. No c/o pain or distress. Will continue to monitor for safety 

## 2021-10-17 NOTE — ED Notes (Signed)
Pt sleeping@this time. Breathing even and unlabored. Will continue to monitor for safety 

## 2021-10-17 NOTE — ED Notes (Signed)
Pt pacing the unit. She is calm and cooperative. No c/o pain or distress. Will continue to monitor for safety

## 2021-10-17 NOTE — ED Provider Notes (Signed)
Teresa Murillo is psychiatrically cleared.  LCSW is seeking placement for assisted living facility.  She denied any acute concerns currently.  She continues to deny suicidal or homicidal ideations.  Denies auditory visual hallucinations.  Has been pleasant, cooperative and redirectable while on the unit.  She continues to take medications as indicated.  No safety concerns noted by nursing staff.  CSW to continue working on discharge disposition.   Per admission assessment note: Teresa Murillo is a 61 year old female with a past psychiatric history of schizophrenia, aggressive behavior, and possible intellectual disability.  She presented to the [facility] behavioral health urgent care after an argument with staff members at her group home that resulted in her signing discharge paperwork and leaving.

## 2021-10-18 NOTE — ED Notes (Signed)
Patient received this AM. Patient sleeping in her bed. No acute distress noted. Will continue to monitor for safety.

## 2021-10-18 NOTE — ED Notes (Signed)
Pt A&O x 4, no distress noted, calm & cooperative, pacing at intervals.  Monitoring for safety.

## 2021-10-18 NOTE — ED Notes (Signed)
Patient awakened for breakfast. Patient compliant with morning medication administration. Patient has also been a snack while awaiting lunch. Patient calm and cooperative. Patient has showered and currently watching TV. No acute distress noted. Respirations are even and unlabored. Will continue to monitor for safety.

## 2021-10-18 NOTE — ED Notes (Signed)
Pt sleeping at present, no distress noted.  Monitoring for safety. 

## 2021-10-18 NOTE — ED Notes (Signed)
Pt was given roast beef, veggies, and juice for lunch.  

## 2021-10-18 NOTE — ED Provider Notes (Signed)
Teresa Murillo was seen and evaluated face-to-face.  Charted history of schizophrenia, major depressive disorder, aggressive behavior and intellectual disability.  She has been pleasant, cooperative while on the unit patient has been redirectable.  Has been compliant with medications.  Patient continues to wait for placement.  She remains psychiatrically cleared.  Ports a good appetite.  States she is resting well throughout the night.  Support, encouragement and  reassurance was provided.

## 2021-10-18 NOTE — ED Notes (Signed)
Patient sleeping with no signs of distress noted - will continue to monitor for safety 

## 2021-10-18 NOTE — ED Notes (Signed)
Patient was given a snack of baked chips and orange juice

## 2021-10-18 NOTE — ED Notes (Signed)
Pt given a snack, graham crackers and peanut butter 

## 2021-10-18 NOTE — ED Notes (Signed)
Patient complaint of leg pain. Advised that she has acetaminophen for pain.

## 2021-10-19 NOTE — ED Provider Notes (Signed)
Behavioral Health Progress Note  Date and Time: 10/19/2021 1:06 PM Name: Teresa Murillo Abramo MRN:  [id number]  Subjective:   Teresa Murillo Spivey is a 61 year old female with a past psychiatric history of schizophrenia, aggressive behavior, and possible intellectual disability.  [facility] after an argument with staff members at her group home that resulted in her signing discharge paperwork and leaving.  Per previous documentation, the group home staff members have reported that the patient has a history of intellectual disability.  Cannot find this documented elsewhere in the chart.  It is certainly possible, as the patient is unable to state any of her home medications and is unable to perform simple math problems.  The patient reports that she receives a disability check each month. The patient is connected with [facility] but does not receive ACT services.  The patient has a care coordinator with [facility] named Ms. Green, with whom the LCSW has frequently been communicating with to determine proper disposition for the patient.   On assessment this morning, the patient is calm and cooperative.  Per nursing documentation the patient has also been calm and cooperative.  Presently she does not appear to be psychotic and she does not admit to any auditory or visual hallucinations.  She denies suicidal or homicidal thoughts.  She reports appropriate mood, sleep, and appetite.   LCSW continues to work with care coordinator to find a discharge plan for the patient.      Diagnosis:  Final diagnoses:  Schizophrenia, unspecified type (HCC)    Total Time spent with patient: 15 minutes  Past Psychiatric History: as above Past Medical History:  Past Medical History:  Diagnosis Date   Chronic obstructive pulmonary disease (COPD) (HCC)    Glaucoma    Hyperlipidemia    Hypertension    Iron deficiency    Schizoaffective disorder (HCC)    Type 2  diabetes mellitus (HCC)     Past Surgical History:  Procedure Laterality Date   TUBAL LIGATION     Family History:  Family History  Problem Relation Age of Onset   Breast cancer Maternal Grandmother    Family Psychiatric  History: as per H and P Social History:  Social History   Substance and Sexual Activity  Alcohol Use Yes     Social History   Substance and Sexual Activity  Drug Use Not Currently    Social History   Socioeconomic History   Marital status: Divorced    Spouse name: Not on file   Number of children: Not on file   Years of education: Not on file   Highest education level: Not on file  Occupational History   Not on file  Tobacco Use   Smoking status: Every Day    Packs/day: 1.00    Types: Cigars, Cigarettes   Smokeless tobacco: Current  Vaping Use   Vaping Use: Never used  Substance and Sexual Activity   Alcohol use: Yes   Drug use: Not Currently   Sexual activity: Not Currently    Birth control/protection: Surgical  Other Topics Concern   Not on file  Social History Narrative   Not on file   Social Determinants of Health   Financial Resource Strain: Not on file  Food Insecurity: Not on file  Transportation Needs: Not on file  Physical Activity: Not on file  Stress: Not on file  Social Connections: Not on file   SDOH:  SDOH Screenings   Alcohol  Screen: Not on file  Depression (PHQ2-9): Not on file  Financial Resource Strain: Not on file  Food Insecurity: Not on file  Housing: Not on file  Physical Activity: Not on file  Social Connections: Not on file  Stress: Not on file  Tobacco Use: High Risk (09/09/2021)   Patient History    Smoking Tobacco Use: Every Day    Smokeless Tobacco Use: Current    Passive Exposure: Not on file  Transportation Needs: Not on file   Additional Social History:    Pain Medications: See MAR Prescriptions: See MAR Over the Counter: See MAR History of alcohol / drug use?: No history of alcohol /  drug abuse Longest period of sobriety (when/how long): N/A Negative Consequences of Use:  (N/A) Withdrawal Symptoms:  (N/A)                    Sleep: Fair  Appetite:  Fair  Current Medications:  Current Facility-Administered Medications  Medication Dose Route Frequency Provider Last Rate Last Admin   acetaminophen (TYLENOL) tablet 650 mg  650 mg Oral Q6H PRN Lauree Chandler, NP   650 mg at 10/18/21 1210   alum & mag hydroxide-simeth (MAALOX/MYLANTA) 200-200-20 MG/5ML suspension 30 mL  30 mL Oral Q4H PRN Lauree Chandler, NP       benztropine (COGENTIN) tablet 1 mg  1 mg Oral BID Lauree Chandler, NP   1 mg at 10/19/21 6160   cloZAPine (CLOZARIL) tablet 50 mg  50 mg Oral BID Lauree Chandler, NP   50 mg at 10/19/21 0835   divalproex (DEPAKOTE ER) 24 hr tablet 500 mg  500 mg Oral BID Lauree Chandler, NP   500 mg at 10/19/21 7371   hydrOXYzine (ATARAX) tablet 50 mg  50 mg Oral TID PRN Carlyn Reichert, MD   50 mg at 10/14/21 1416   latanoprost (XALATAN) 0.005 % ophthalmic solution 1 drop  1 drop Both Eyes QHS Lauree Chandler, NP   1 drop at 10/18/21 2142   lisinopril (ZESTRIL) tablet 10 mg  10 mg Oral Daily Lauree Chandler, NP   10 mg at 10/19/21 0626   magnesium hydroxide (MILK OF MAGNESIA) suspension 30 mL  30 mL Oral Daily PRN Lauree Chandler, NP       metFORMIN (GLUCOPHAGE) tablet 500 mg  500 mg Oral BID WC Lauree Chandler, NP   500 mg at 10/19/21 0830   mometasone-formoterol (DULERA) 200-5 MCG/ACT inhaler 2 puff  2 puff Inhalation BID Lauree Chandler, NP   2 puff at 10/19/21 0835   nicotine (NICODERM CQ - dosed in mg/24 hr) patch 7 mg  7 mg Transdermal Daily Lauree Chandler, NP   7 mg at 10/19/21 0834   OLANZapine (ZYPREXA) tablet 20 mg  20 mg Oral QHS Carlyn Reichert, MD   20 mg at 10/18/21 2126   valbenazine (INGREZZA) capsule 40 mg  40 mg Oral q AM Lauree Chandler, NP   40 mg at 10/19/21 9485   Current Outpatient Medications   Medication Sig Dispense Refill   benztropine (COGENTIN) 1 MG tablet Take 1 mg by mouth in the morning and at bedtime.     budesonide-formoterol (SYMBICORT) 160-4.5 MCG/ACT inhaler Inhale 2 puffs into the lungs in the morning and at bedtime.     Cholecalciferol (VITAMIN D3) 1.25 MG (50000 UT) CAPS Take 50,000 Units by mouth every Thursday.     clozapine (CLOZARIL) 50 MG tablet Take 50  mg by mouth 2 (two) times daily.     divalproex (DEPAKOTE ER) 500 MG 24 hr tablet Take 500 mg by mouth in the morning and at bedtime.     haloperidol (HALDOL) 10 MG tablet Take 10 mg by mouth 3 (three) times daily.     INGREZZA 40 MG capsule Take 40 mg by mouth in the morning.     latanoprost (XALATAN) 0.005 % ophthalmic solution Place 1 drop into both eyes at bedtime.     lisinopril (ZESTRIL) 10 MG tablet Take 10 mg by mouth daily.     metFORMIN (GLUCOPHAGE) 500 MG tablet Take 500 mg by mouth 2 (two) times daily with a meal.     nicotine (NICODERM CQ - DOSED IN MG/24 HR) 7 mg/24hr patch Place 7 mg onto the skin daily.     OLANZapine-Samidorphan (LYBALVI) 20-10 MG TABS Take 1 tablet by mouth at bedtime.     PROAIR HFA 108 (90 Base) MCG/ACT inhaler Inhale 2 puffs into the lungs every 6 (six) hours as needed for wheezing or shortness of breath.     zolpidem (AMBIEN) 10 MG tablet Take 10 mg by mouth at bedtime.      Labs  Lab Results:  Admission on 10/04/2021  Component Date Value Ref Range Status   SARS Coronavirus 2 by RT PCR 10/04/2021 NEGATIVE  NEGATIVE Final   Comment: (NOTE) SARS-CoV-2 target nucleic acids are NOT DETECTED.  The SARS-CoV-2 RNA is generally detectable in upper respiratory specimens during the acute phase of infection. The lowest concentration of SARS-CoV-2 viral copies this assay can detect is 138 copies/mL. A negative result does not preclude SARS-Cov-2 infection and should not be used as the sole basis for treatment or other patient management decisions. A negative result may occur  with  improper specimen collection/handling, submission of specimen other than nasopharyngeal swab, presence of viral mutation(s) within the areas targeted by this assay, and inadequate number of viral copies(<138 copies/mL). A negative result must be combined with clinical observations, patient history, and epidemiological information. The expected result is Negative.  Fact Sheet for Patients:  BloggerCourse.com  Fact Sheet for Healthcare Providers:  SeriousBroker.it  This test is no                          t yet approved or cleared by the [place] FDA and  has been authorized for detection and/or diagnosis of SARS-CoV-2 by FDA under an Emergency Use Authorization (EUA). This EUA will remain  in effect (meaning this test can be used) for the duration of the COVID-19 declaration under Section 564(b)(1) of the Act, 21 U.S.C.section 360bbb-3(b)(1), unless the authorization is terminated  or revoked sooner.       Influenza A by PCR 10/04/2021 NEGATIVE  NEGATIVE Final   Influenza B by PCR 10/04/2021 NEGATIVE  NEGATIVE Final   Comment: (NOTE) The Xpert Xpress SARS-CoV-2/FLU/RSV plus assay is intended as an aid in the diagnosis of influenza from Nasopharyngeal swab specimens and should not be used as a sole basis for treatment. Nasal washings and aspirates are unacceptable for Xpert Xpress SARS-CoV-2/FLU/RSV testing.  Fact Sheet for Patients: BloggerCourse.com  Fact Sheet for Healthcare Providers: SeriousBroker.it  This test is not yet approved or cleared by the [place] FDA and has been authorized for detection and/or diagnosis of SARS-CoV-2 by FDA under an Emergency Use Authorization (EUA). This EUA will remain in effect (meaning this test can be used) for the duration of  the COVID-19 declaration under Section 564(b)(1) of the Act, 21 U.S.C. section 360bbb-3(b)(1),  unless the authorization is terminated or revoked.  Performed at [facility] Lab, [address], [place], [place] [address]    WBC 10/04/2021 8.4  4.0 - 10.5 K/uL Final   RBC 10/04/2021 4.42  3.87 - 5.11 MIL/uL Final   Hemoglobin 10/04/2021 11.6 (L)  12.0 - 15.0 g/dL Final   HCT 19/14/7829 35.7 (L)  36.0 - 46.0 % Final   MCV 10/04/2021 80.8  80.0 - 100.0 fL Final   MCH 10/04/2021 26.2  26.0 - 34.0 pg Final   MCHC 10/04/2021 32.5  30.0 - 36.0 g/dL Final   RDW 56/21/3086 16.0 (H)  11.5 - 15.5 % Final   Platelets 10/04/2021 372  150 - 400 K/uL Final   nRBC 10/04/2021 0.0  0.0 - 0.2 % Final   Neutrophils Relative % 10/04/2021 68  % Final   Neutro Abs 10/04/2021 5.7  1.7 - 7.7 K/uL Final   Lymphocytes Relative 10/04/2021 22  % Final   Lymphs Abs 10/04/2021 1.8  0.7 - 4.0 K/uL Final   Monocytes Relative 10/04/2021 8  % Final   Monocytes Absolute 10/04/2021 0.7  0.1 - 1.0 K/uL Final   Eosinophils Relative 10/04/2021 1  % Final   Eosinophils Absolute 10/04/2021 0.1  0.0 - 0.5 K/uL Final   Basophils Relative 10/04/2021 1  % Final   Basophils Absolute 10/04/2021 0.1  0.0 - 0.1 K/uL Final   Immature Granulocytes 10/04/2021 0  % Final   Abs Immature Granulocytes 10/04/2021 0.03  0.00 - 0.07 K/uL Final   Performed at [facility], [address], [place], [place] [address]   Sodium 10/04/2021 136  135 - 145 mmol/L Final   Potassium 10/04/2021 4.2  3.5 - 5.1 mmol/L Final   Chloride 10/04/2021 104  98 - 111 mmol/L Final   CO2 10/04/2021 25  22 - 32 mmol/L Final   Glucose, Bld 10/04/2021 100 (H)  70 - 99 mg/dL Final   Glucose reference range applies only to samples taken after fasting for at least 8 hours.   BUN 10/04/2021 9  6 - 20 mg/dL Final   Creatinine, Ser 10/04/2021 0.52  0.44 - 1.00 mg/dL Final   Calcium 96/29/5284 9.0  8.9 - 10.3 mg/dL Final   Total Protein 13/24/4010 7.0  6.5 - 8.1 g/dL Final   Albumin 27/25/3664 3.2 (L)  3.5 - 5.0 g/dL Final   AST 40/34/7425 13 (L)  15 -  41 U/L Final   ALT 10/04/2021 10  0 - 44 U/L Final   Alkaline Phosphatase 10/04/2021 61  38 - 126 U/L Final   Total Bilirubin 10/04/2021 0.3  0.3 - 1.2 mg/dL Final   GFR, Estimated 10/04/2021 >60  >60 mL/min Final   Comment: (NOTE) Calculated using the CKD-EPI Creatinine Equation (2021)    Anion gap 10/04/2021 7  5 - 15 Final   Performed at [facility], [address] [place], [place] [address]   Hgb A1c MFr Bld 10/04/2021 7.0 (H)  4.8 - 5.6 % Final   Comment: (NOTE) Pre diabetes:          5.7%-6.4%  Diabetes:              >6.4%  Glycemic control for   <7.0% adults with diabetes    Mean Plasma Glucose 10/04/2021 154.2  mg/dL Final   Performed at [facility], [address]  [place], [place] [address]   Cholesterol 10/04/2021 178  0 - 200 mg/dL Final   Triglycerides 60/45/4098 155 (H)  <150 mg/dL Final   HDL 11/91/4782 52  >40 mg/dL Final   Total CHOL/HDL Ratio 10/04/2021 3.4  RATIO Final   VLDL 10/04/2021 31  0 - 40 mg/dL Final   LDL Cholesterol 10/04/2021 95  0 - 99 mg/dL Final   Comment:        Total Cholesterol/HDL:CHD Risk Coronary Heart Disease Risk Table                     Men   Women  1/2 Average Risk   3.4   3.3  Average Risk       5.0   4.4  2 X Average Risk   9.6   7.1  3 X Average Risk  23.4   11.0        Use the calculated Patient Ratio above and the CHD Risk Table to determine the patient's CHD Risk.        ATP III CLASSIFICATION (LDL):  <100     mg/dL   Optimal  956-213  mg/dL   Near or Above                    Optimal  130-159  mg/dL   Borderline  086-578  mg/dL   High  >469     mg/dL   Very High Performed at [facility], [address] [place], [place] [address]    POC Amphetamine UR 10/04/2021 None Detected  NONE DETECTED (Cut Off Level 1000 ng/mL) Final   POC Secobarbital (BAR) 10/04/2021 None Detected  NONE DETECTED (Cut Off Level 300 ng/mL) Final   POC Buprenorphine (BUP) 10/04/2021 None Detected  NONE DETECTED (Cut Off  Level 10 ng/mL) Final   POC Oxazepam (BZO) 10/04/2021 None Detected  NONE DETECTED (Cut Off Level 300 ng/mL) Final   POC Cocaine UR 10/04/2021 None Detected  NONE DETECTED (Cut Off Level 300 ng/mL) Final   POC Methamphetamine UR 10/04/2021 None Detected  NONE DETECTED (Cut Off Level 1000 ng/mL) Final   POC Morphine 10/04/2021 None Detected  NONE DETECTED (Cut Off Level 300 ng/mL) Final   POC Methadone UR 10/04/2021 None Detected  NONE DETECTED (Cut Off Level 300 ng/mL) Final   POC Oxycodone UR 10/04/2021 Positive (A)  NONE DETECTED (Cut Off Level 100 ng/mL) Final   POC Marijuana UR 10/04/2021 None Detected  NONE DETECTED (Cut Off Level 50 ng/mL) Final   SARSCOV2ONAVIRUS 2 AG 10/04/2021 NEGATIVE  NEGATIVE Final   Comment: (NOTE) SARS-CoV-2 antigen NOT DETECTED.   Negative results are presumptive.  Negative results do not preclude SARS-CoV-2 infection and should not be used as the sole basis for treatment or other patient management decisions, including infection  control decisions, particularly in the presence of clinical signs and  symptoms consistent with COVID-19, or in those who have been in contact with the virus.  Negative results must be combined with clinical observations, patient history, and epidemiological information. The expected result is Negative.  Fact Sheet for Patients: https://www.jennings-kim.com/  Fact Sheet for Healthcare Providers: https://alexander-rogers.biz/  This test is not yet approved or cleared by the [place] FDA and  has been authorized for detection and/or diagnosis of SARS-CoV-2 by FDA under an Emergency Use Authorization (EUA).  This EUA will remain in effect (meaning this test can be used) for the duration of  the COV  ID-19 declaration under Section 564(b)(1) of the Act, 21 U.S.C. section 360bbb-3(b)(1), unless the authorization is terminated or revoked sooner.     Valproic Acid Lvl  10/04/2021 51  50.0 - 100.0 ug/mL Final   Performed at [facility], [address], [place], [place] [address]   Valproic Acid Lvl 10/08/2021 57  50.0 - 100.0 ug/mL Final   Performed at [facility], [address], [place], [place] [address]   Glucose-Capillary 10/09/2021 123 (H)  70 - 99 mg/dL Final   Glucose reference range applies only to samples taken after fasting for at least 8 hours.  Admission on 09/09/2021, Discharged on 09/10/2021  Component Date Value Ref Range Status   Sodium 09/09/2021 134 (L)  135 - 145 mmol/L Final   Potassium 09/09/2021 4.3  3.5 - 5.1 mmol/L Final   Chloride 09/09/2021 99  98 - 111 mmol/L Final   CO2 09/09/2021 25  22 - 32 mmol/L Final   Glucose, Bld 09/09/2021 107 (H)  70 - 99 mg/dL Final   Glucose reference range applies only to samples taken after fasting for at least 8 hours.   BUN 09/09/2021 12  6 - 20 mg/dL Final   Creatinine, Ser 09/09/2021 0.74  0.44 - 1.00 mg/dL Final   Calcium 19/62/2297 9.0  8.9 - 10.3 mg/dL Final   Total Protein 98/92/1194 7.4  6.5 - 8.1 g/dL Final   Albumin 17/40/8144 3.1 (L)  3.5 - 5.0 g/dL Final   AST 81/85/6314 13 (L)  15 - 41 U/L Final   ALT 09/09/2021 13  0 - 44 U/L Final   Alkaline Phosphatase 09/09/2021 66  38 - 126 U/L Final   Total Bilirubin 09/09/2021 0.2 (L)  0.3 - 1.2 mg/dL Final   GFR, Estimated 09/09/2021 >60  >60 mL/min Final   Comment: (NOTE) Calculated using the CKD-EPI Creatinine Equation (2021)    Anion gap 09/09/2021 10  5 - 15 Final   Performed at [facility], [address] [place], [place] [address]   WBC 09/09/2021 8.5  4.0 - 10.5 K/uL Final   RBC 09/09/2021 4.53  3.87 - 5.11 MIL/uL Final   Hemoglobin 09/09/2021 11.8 (L)  12.0 - 15.0 g/dL Final   HCT 37/85/8850 38.0  36.0 - 46.0 % Final   MCV 09/09/2021 83.9  80.0 - 100.0 fL Final   MCH 09/09/2021 26.0  26.0 - 34.0 pg Final   MCHC 09/09/2021 31.1  30.0 - 36.0 g/dL Final   RDW 27/74/1287 17.8 (H)  11.5 - 15.5 % Final    Platelets 09/09/2021 442 (H)  150 - 400 K/uL Final   nRBC 09/09/2021 0.0  0.0 - 0.2 % Final   Neutrophils Relative % 09/09/2021 70  % Final   Neutro Abs 09/09/2021 5.9  1.7 - 7.7 K/uL Final   Lymphocytes Relative 09/09/2021 20  % Final   Lymphs Abs 09/09/2021 1.7  0.7 - 4.0 K/uL Final   Monocytes Relative 09/09/2021 8  % Final   Monocytes Absolute 09/09/2021 0.7  0.1 - 1.0 K/uL Final   Eosinophils Relative 09/09/2021 1  % Final   Eosinophils Absolute 09/09/2021 0.1  0.0 - 0.5 K/uL Final   Basophils Relative 09/09/2021 1  % Final   Basophils Absolute 09/09/2021 0.1  0.0 - 0.1 K/uL Final   Immature Granulocytes 09/09/2021 0  % Final   Abs Immature Granulocytes 09/09/2021 0.03  0.00 - 0.07 K/uL Final   Performed at [facility]  Lab, [address] [place], [place] [address]   Ammonia 09/09/2021 37 (H)  9 - 35 umol/L Final   Comment: HEMOLYSIS AT THIS LEVEL MAY AFFECT RESULT Performed at [facility], [address] [place], [place] [address]    Opiates 09/09/2021 NONE DETECTED  NONE DETECTED Final   Cocaine 09/09/2021 NONE DETECTED  NONE DETECTED Final   Benzodiazepines 09/09/2021 NONE DETECTED  NONE DETECTED Final   Amphetamines 09/09/2021 NONE DETECTED  NONE DETECTED Final   Tetrahydrocannabinol 09/09/2021 NONE DETECTED  NONE DETECTED Final   Barbiturates 09/09/2021 NONE DETECTED  NONE DETECTED Final   Comment: (NOTE) DRUG SCREEN FOR MEDICAL PURPOSES ONLY.  IF CONFIRMATION IS NEEDED FOR ANY PURPOSE, NOTIFY LAB WITHIN 5 DAYS.  LOWEST DETECTABLE LIMITS FOR URINE DRUG SCREEN Drug Class                     Cutoff (ng/mL) Amphetamine and metabolites    1000 Barbiturate and metabolites    200 Benzodiazepine                 200 Tricyclics and metabolites     300 Opiates and metabolites        300 Cocaine and metabolites        300 THC                            50 Performed at [facility], [address] [place], [place] [address]    Alcohol, Ethyl (B) 09/09/2021  <10  <10 mg/dL Final   Comment: (NOTE) Lowest detectable limit for serum alcohol is 10 mg/dL.  For medical purposes only. Performed at [facility], [address] [place], [place] [address]    Lipase 09/09/2021 33  11 - 51 U/L Final   Performed at [facility], [address] [place], [place] [address]   Color, Urine 09/09/2021 COLORLESS (A)  YELLOW Final   APPearance 09/09/2021 CLEAR  CLEAR Final   Specific Gravity, Urine 09/09/2021 1.002 (L)  1.005 - 1.030 Final   pH 09/09/2021 6.0  5.0 - 8.0 Final   Glucose, UA 09/09/2021 NEGATIVE  NEGATIVE mg/dL Final   Hgb urine dipstick 09/09/2021 NEGATIVE  NEGATIVE Final   Bilirubin Urine 09/09/2021 NEGATIVE  NEGATIVE Final   Ketones, ur 09/09/2021 NEGATIVE  NEGATIVE mg/dL Final   Protein, ur 69/62/952807/01/2022 NEGATIVE  NEGATIVE mg/dL Final   Nitrite 41/32/440107/01/2022 NEGATIVE  NEGATIVE Final   Leukocytes,Ua 09/09/2021 NEGATIVE  NEGATIVE Final   Performed at [facility], [address] [place], [place] [address]   Specimen Description 09/09/2021 URINE, CLEAN CATCH   Final   Special Requests 09/09/2021 NONE   Final   Culture 09/09/2021  (A)   Final                   Value:<10,000 COLONIES/mL INSIGNIFICANT GROWTH Performed at [facility], [address] [place], [place] [address]    Report Status 09/09/2021 09/10/2021 FINAL   Final   D-Dimer, Quant 09/09/2021 0.92 (H)  0.00 - 0.50 ug/mL-FEU Final   Comment: (NOTE) At the manufacturer cut-off value of 0.5 g/mL FEU, this assay has a negative predictive value of 95-100%.This assay is intended for use in conjunction with a clinical pretest probability (PTP) assessment model to exclude pulmonary embolism (PE) and deep venous thrombosis (DVT) in outpatients suspected of PE or DVT. Results should be  correlated with clinical presentation. Performed at [facility], [address], [place], [place] [address]    Troponin I (High Sensitivity) 09/09/2021 5  <18 ng/L Final    Comment: (NOTE) Elevated high sensitivity troponin I (hsTnI) values and significant  changes across serial measurements may suggest ACS but many other  chronic and acute conditions are known to elevate hsTnI results.  Refer to the "Links" section for chest pain algorithms and additional  guidance. Performed at [facility], [address], [place], [place] [address]    BP 09/10/2021 135/83  mmHg Final   S' Lateral 09/10/2021 2.30  cm Final   Area-P 1/2 09/10/2021 5.58  cm2 Final    Blood Alcohol level:  Lab Results  Component Value Date   ETH <10 09/09/2021   ETH <10 10/20/2020    Metabolic Disorder Labs: Lab Results  Component Value Date   HGBA1C 7.0 (H) 10/04/2021   MPG 154.2 10/04/2021   No results found for: "PROLACTIN" Lab Results  Component Value Date   CHOL 178 10/04/2021   TRIG 155 (H) 10/04/2021   HDL 52 10/04/2021   CHOLHDL 3.4 10/04/2021   VLDL 31 10/04/2021   LDLCALC 95 10/04/2021    Therapeutic Lab Levels: No results found for: "LITHIUM" Lab Results  Component Value Date   VALPROATE 57 10/08/2021   VALPROATE 51 10/04/2021   No results found for: "CBMZ"  Physical Findings   Flowsheet Row ED from 10/04/2021 in [facility] ED from 09/09/2021 in [facility] EMERGENCY DEPARTMENT ED from 08/22/2021 in [facility] Dept  C-SSRS RISK CATEGORY No Risk No Risk No Risk        Musculoskeletal  Strength & Muscle Tone: within normal limits Gait & Station: normal Patient leans: N/A  Psychiatric Specialty Exam  Presentation  General Appearance: Appropriate for Environment  Eye Contact:Good  Speech:Clear and Coherent  Speech Volume:Normal  Handedness:Right   Mood and Affect  Mood:Euthymic  Affect:Constricted   Thought Process  Thought Processes:Coherent; Linear  Descriptions of Associations:Intact  Orientation:Full (Time, Place and Person)  Thought Content:Logical   Diagnosis of Schizophrenia or Schizoaffective disorder in past: Yes  Duration of Psychotic Symptoms: Greater than six months   Hallucinations: denies  Ideas of Reference:None  Suicidal Thoughts: denies  Homicidal Thoughts: denies   Sensorium  Memory:Immediate Fair; Recent Fair; Remote Fair  Judgment:Poor  Insight:Poor   Executive Functions  Concentration:Poor  Attention Span:Poor  Recall:Poor  Fund of Knowledge:Poor  Language:Poor   Psychomotor Activity  Psychomotor Activity: normal   Assets  Assets:Communication Skills; Desire for Improvement   Sleep  Sleep: fair   Physical Exam  Physical Exam Constitutional:      Appearance: the patient is not toxic-appearing.  Pulmonary:     Effort: Pulmonary effort is normal.  Neurological:     General: No focal deficit present.     Mental Status: the patient is alert and oriented to person, place, and time.   Review of Systems  Respiratory:  Negative for shortness of breath.   Cardiovascular:  Negative for chest pain.  Gastrointestinal:  Negative for abdominal pain, constipation, diarrhea, nausea and vomiting.  Neurological:  Negative for headaches.   Blood pressure 104/60, pulse 88, temperature 98.9 F (37.2 C), temperature source Oral, resp. rate 18, SpO2 98 %. There is no height or weight on file to calculate BMI.  Treatment Plan Summary: Daily contact with patient to assess and evaluate symptoms and progress in  treatment and Medication management  Status: Voluntary Disposition: TBD, difficult to place given ID diagnosis and schizophrenia with aggression -LCSW working on disposition planning  Hx of schizophrenia and aggression Continue home medications as listed - Discontinued Haldol 10 mg 3 times daily because this is not currently being prescribed by her outpatient provider -- Continue Zyprexa 20 mg QHS (in place of Lybalvi) - Continue Cogentin 1 mg twice daily - Continue Clozapine 50 mg twice  daily, ACTT confirms patient is prescribed this medicine - Ingrezza 40 mg daily - Depakote extended release 500 mg twice daily    Labs: VPA trough of 51 8/6, 57 on 8/10 UDS with oxy A1C of 7 Hgb of 11.6 MCV of 80, will need outpatient follow up   Carlyn Reichert, MD 10/19/2021 1:06 PM

## 2021-10-19 NOTE — ED Notes (Signed)
Pt sleeping at present, no distress noted.  Monitoring for safety. 

## 2021-10-19 NOTE — ED Notes (Signed)
Pt is in the bed sleeping. Respirations are even and unlabored. No acute distress noted. Will continue to monitor for safety. 

## 2021-10-19 NOTE — ED Notes (Signed)
Received patient this PM. Patient in her bed resting. Patient respirations are even and unlabored. Will continue to monitor for safety.  

## 2021-10-19 NOTE — ED Notes (Signed)
Patient ate breakfast

## 2021-10-19 NOTE — BH Assessment (Signed)
LCSW Progress Note   0924 - LCSW sent email to Mariea Clonts @ [facility] requesting an update on placement for pt.   Hansel Starling, MSW, LCSW [facility] [phone] phone

## 2021-10-20 NOTE — ED Notes (Signed)
Breakfast given.  

## 2021-10-20 NOTE — ED Provider Notes (Signed)
Behavioral Health Progress Note  Date and Time: 10/20/2021 6:26 PM Name: Teresa Murillo MRN:  [id number]  Subjective:   Teresa Murillo is a 61 year old female with a past psychiatric history of schizophrenia, aggressive behavior, and possible intellectual disability.  She presented to the [facility] behavioral health urgent care after an argument with staff members at her group home that resulted in her signing discharge paperwork and leaving.  Per previous documentation, the group home staff members have reported that the patient has a history of intellectual disability.  Cannot find this documented elsewhere in the chart.  It is certainly possible, as the patient is unable to state any of her home medications and is unable to perform simple math problems.  The patient reports that she receives a disability check each month. The patient is connected with [facility] but does not receive ACT services.  The patient has a care coordinator with [facility] named Ms. Green, with whom the LCSW has frequently been communicating with to determine proper disposition for the patient.   On assessment this morning, the patient is calm and cooperative.  Per nursing documentation the patient has also been calm and cooperative.  Presently she does not appear to be psychotic and she does not admit to any auditory or visual hallucinations.  She denies suicidal or homicidal thoughts.  She reports appropriate mood, sleep, and appetite.   LCSW states patient may be accepted back to group home if FL2 can be filled out.      Diagnosis:  Final diagnoses:  Schizophrenia, unspecified type (HCC)    Total Time spent with patient: 15 minutes  Past Psychiatric History: as above Past Medical History:  Past Medical History:  Diagnosis Date   Chronic obstructive pulmonary disease (COPD) (HCC)    Glaucoma    Hyperlipidemia    Hypertension    Iron deficiency    Schizoaffective disorder (HCC)    Type 2  diabetes mellitus (HCC)     Past Surgical History:  Procedure Laterality Date   TUBAL LIGATION     Family History:  Family History  Problem Relation Age of Onset   Breast cancer Maternal Grandmother    Family Psychiatric  History: as per H and P Social History:  Social History   Substance and Sexual Activity  Alcohol Use Yes     Social History   Substance and Sexual Activity  Drug Use Not Currently    Social History   Socioeconomic History   Marital status: Divorced    Spouse name: Not on file   Number of children: Not on file   Years of education: Not on file   Highest education level: Not on file  Occupational History   Not on file  Tobacco Use   Smoking status: Every Day    Packs/day: 1.00    Types: Cigars, Cigarettes   Smokeless tobacco: Current  Vaping Use   Vaping Use: Never used  Substance and Sexual Activity   Alcohol use: Yes   Drug use: Not Currently   Sexual activity: Not Currently    Birth control/protection: Surgical  Other Topics Concern   Not on file  Social History Narrative   Not on file   Social Determinants of Health   Financial Resource Strain: Not on file  Food Insecurity: Not on file  Transportation Needs: Not on file  Physical Activity: Not on file  Stress: Not on file  Social Connections: Not on file   SDOH:  SDOH Screenings  Alcohol Screen: Not on file  Depression (PHQ2-9): Not on file  Financial Resource Strain: Not on file  Food Insecurity: Not on file  Housing: Not on file  Physical Activity: Not on file  Social Connections: Not on file  Stress: Not on file  Tobacco Use: High Risk (09/09/2021)   Patient History    Smoking Tobacco Use: Every Day    Smokeless Tobacco Use: Current    Passive Exposure: Not on file  Transportation Needs: Not on file   Additional Social History:    Pain Medications: See MAR Prescriptions: See MAR Over the Counter: See MAR History of alcohol / drug use?: No history of alcohol /  drug abuse Longest period of sobriety (when/how long): N/A Negative Consequences of Use:  (N/A) Withdrawal Symptoms:  (N/A)                    Sleep: Fair  Appetite:  Fair  Current Medications:  Current Facility-Administered Medications  Medication Dose Route Frequency Provider Last Rate Last Admin   acetaminophen (TYLENOL) tablet 650 mg  650 mg Oral Q6H PRN Lauree Chandler, NP   650 mg at 10/18/21 1210   alum & mag hydroxide-simeth (MAALOX/MYLANTA) 200-200-20 MG/5ML suspension 30 mL  30 mL Oral Q4H PRN Lauree Chandler, NP       benztropine (COGENTIN) tablet 1 mg  1 mg Oral BID Lauree Chandler, NP   1 mg at 10/20/21 1108   cloZAPine (CLOZARIL) tablet 50 mg  50 mg Oral BID Lauree Chandler, NP   50 mg at 10/20/21 0908   divalproex (DEPAKOTE ER) 24 hr tablet 500 mg  500 mg Oral BID Lauree Chandler, NP   500 mg at 10/20/21 0908   hydrOXYzine (ATARAX) tablet 50 mg  50 mg Oral TID PRN Carlyn Reichert, MD   50 mg at 10/14/21 1416   latanoprost (XALATAN) 0.005 % ophthalmic solution 1 drop  1 drop Both Eyes QHS Lauree Chandler, NP   1 drop at 10/19/21 2103   lisinopril (ZESTRIL) tablet 10 mg  10 mg Oral Daily Lauree Chandler, NP   10 mg at 10/20/21 2440   magnesium hydroxide (MILK OF MAGNESIA) suspension 30 mL  30 mL Oral Daily PRN Lauree Chandler, NP       metFORMIN (GLUCOPHAGE) tablet 500 mg  500 mg Oral BID WC Lauree Chandler, NP   500 mg at 10/20/21 1733   mometasone-formoterol (DULERA) 200-5 MCG/ACT inhaler 2 puff  2 puff Inhalation BID Lauree Chandler, NP   2 puff at 10/20/21 0850   nicotine (NICODERM CQ - dosed in mg/24 hr) patch 7 mg  7 mg Transdermal Daily Lauree Chandler, NP   7 mg at 10/20/21 0909   OLANZapine (ZYPREXA) tablet 20 mg  20 mg Oral QHS Carlyn Reichert, MD   20 mg at 10/19/21 2057   valbenazine (INGREZZA) capsule 40 mg  40 mg Oral q AM Lauree Chandler, NP   40 mg at 10/20/21 1027   Current Outpatient Medications   Medication Sig Dispense Refill   benztropine (COGENTIN) 1 MG tablet Take 1 mg by mouth in the morning and at bedtime.     budesonide-formoterol (SYMBICORT) 160-4.5 MCG/ACT inhaler Inhale 2 puffs into the lungs in the morning and at bedtime.     Cholecalciferol (VITAMIN D3) 1.25 MG (50000 UT) CAPS Take 50,000 Units by mouth every Thursday.     clozapine (CLOZARIL) 50 MG tablet Take  50 mg by mouth 2 (two) times daily.     divalproex (DEPAKOTE ER) 500 MG 24 hr tablet Take 500 mg by mouth in the morning and at bedtime.     haloperidol (HALDOL) 10 MG tablet Take 10 mg by mouth 3 (three) times daily.     INGREZZA 40 MG capsule Take 40 mg by mouth in the morning.     latanoprost (XALATAN) 0.005 % ophthalmic solution Place 1 drop into both eyes at bedtime.     lisinopril (ZESTRIL) 10 MG tablet Take 10 mg by mouth daily.     metFORMIN (GLUCOPHAGE) 500 MG tablet Take 500 mg by mouth 2 (two) times daily with a meal.     nicotine (NICODERM CQ - DOSED IN MG/24 HR) 7 mg/24hr patch Place 7 mg onto the skin daily.     OLANZapine-Samidorphan (LYBALVI) 20-10 MG TABS Take 1 tablet by mouth at bedtime.     PROAIR HFA 108 (90 Base) MCG/ACT inhaler Inhale 2 puffs into the lungs every 6 (six) hours as needed for wheezing or shortness of breath.     zolpidem (AMBIEN) 10 MG tablet Take 10 mg by mouth at bedtime.      Labs  Lab Results:  Admission on 10/04/2021  Component Date Value Ref Range Status   SARS Coronavirus 2 by RT PCR 10/04/2021 NEGATIVE  NEGATIVE Final   Comment: (NOTE) SARS-CoV-2 target nucleic acids are NOT DETECTED.  The SARS-CoV-2 RNA is generally detectable in upper respiratory specimens during the acute phase of infection. The lowest concentration of SARS-CoV-2 viral copies this assay can detect is 138 copies/mL. A negative result does not preclude SARS-Cov-2 infection and should not be used as the sole basis for treatment or other patient management decisions. A negative result may occur  with  improper specimen collection/handling, submission of specimen other than nasopharyngeal swab, presence of viral mutation(s) within the areas targeted by this assay, and inadequate number of viral copies(<138 copies/mL). A negative result must be combined with clinical observations, patient history, and epidemiological information. The expected result is Negative.  Fact Sheet for Patients:  BloggerCourse.com  Fact Sheet for Healthcare Providers:  SeriousBroker.it  This test is no                          t yet approved or cleared by the [place] FDA and  has been authorized for detection and/or diagnosis of SARS-CoV-2 by FDA under an Emergency Use Authorization (EUA). This EUA will remain  in effect (meaning this test can be used) for the duration of the COVID-19 declaration under Section 564(b)(1) of the Act, 21 U.S.C.section 360bbb-3(b)(1), unless the authorization is terminated  or revoked sooner.       Influenza A by PCR 10/04/2021 NEGATIVE  NEGATIVE Final   Influenza B by PCR 10/04/2021 NEGATIVE  NEGATIVE Final   Comment: (NOTE) The Xpert Xpress SARS-CoV-2/FLU/RSV plus assay is intended as an aid in the diagnosis of influenza from Nasopharyngeal swab specimens and should not be used as a sole basis for treatment. Nasal washings and aspirates are unacceptable for Xpert Xpress SARS-CoV-2/FLU/RSV testing.  Fact Sheet for Patients: BloggerCourse.com  Fact Sheet for Healthcare Providers: SeriousBroker.it  This test is not yet approved or cleared by the [place] FDA and has been authorized for detection and/or diagnosis of SARS-CoV-2 by FDA under an Emergency Use Authorization (EUA). This EUA will remain in effect (meaning this test can be used) for the duration  of the COVID-19 declaration under Section 564(b)(1) of the Act, 21 U.S.C. section 360bbb-3(b)(1),  unless the authorization is terminated or revoked.  Performed at [facility], [address] [place], [place] [address]    WBC 10/04/2021 8.4  4.0 - 10.5 K/uL Final   RBC 10/04/2021 4.42  3.87 - 5.11 MIL/uL Final   Hemoglobin 10/04/2021 11.6 (L)  12.0 - 15.0 g/dL Final   HCT 60/45/4098 35.7 (L)  36.0 - 46.0 % Final   MCV 10/04/2021 80.8  80.0 - 100.0 fL Final   MCH 10/04/2021 26.2  26.0 - 34.0 pg Final   MCHC 10/04/2021 32.5  30.0 - 36.0 g/dL Final   RDW 11/91/4782 16.0 (H)  11.5 - 15.5 % Final   Platelets 10/04/2021 372  150 - 400 K/uL Final   nRBC 10/04/2021 0.0  0.0 - 0.2 % Final   Neutrophils Relative % 10/04/2021 68  % Final   Neutro Abs 10/04/2021 5.7  1.7 - 7.7 K/uL Final   Lymphocytes Relative 10/04/2021 22  % Final   Lymphs Abs 10/04/2021 1.8  0.7 - 4.0 K/uL Final   Monocytes Relative 10/04/2021 8  % Final   Monocytes Absolute 10/04/2021 0.7  0.1 - 1.0 K/uL Final   Eosinophils Relative 10/04/2021 1  % Final   Eosinophils Absolute 10/04/2021 0.1  0.0 - 0.5 K/uL Final   Basophils Relative 10/04/2021 1  % Final   Basophils Absolute 10/04/2021 0.1  0.0 - 0.1 K/uL Final   Immature Granulocytes 10/04/2021 0  % Final   Abs Immature Granulocytes 10/04/2021 0.03  0.00 - 0.07 K/uL Final   Performed at [facility], [address], [place], [place] [address]   Sodium 10/04/2021 136  135 - 145 mmol/L Final   Potassium 10/04/2021 4.2  3.5 - 5.1 mmol/L Final   Chloride 10/04/2021 104  98 - 111 mmol/L Final   CO2 10/04/2021 25  22 - 32 mmol/L Final   Glucose, Bld 10/04/2021 100 (H)  70 - 99 mg/dL Final   Glucose reference range applies only to samples taken after fasting for at least 8 hours.   BUN 10/04/2021 9  6 - 20 mg/dL Final   Creatinine, Ser 10/04/2021 0.52  0.44 - 1.00 mg/dL Final   Calcium 30/86/5784 9.0  8.9 - 10.3 mg/dL Final   Total Protein 69/62/9528 7.0  6.5 - 8.1 g/dL Final   Albumin 41/32/4401 3.2 (L)  3.5 - 5.0 g/dL Final   AST 02/72/5366 13 (L)  15 -  41 U/L Final   ALT 10/04/2021 10  0 - 44 U/L Final   Alkaline Phosphatase 10/04/2021 61  38 - 126 U/L Final   Total Bilirubin 10/04/2021 0.3  0.3 - 1.2 mg/dL Final   GFR, Estimated 10/04/2021 >60  >60 mL/min Final   Comment: (NOTE) Calculated using the CKD-EPI Creatinine Equation (2021)    Anion gap 10/04/2021 7  5 - 15 Final   Performed at [facility], [address], [place], [place] [address]   Hgb A1c MFr Bld 10/04/2021 7.0 (H)  4.8 - 5.6 % Final   Comment: (NOTE) Pre diabetes:          5.7%-6.4%  Diabetes:              >6.4%  Glycemic control for   <7.0% adults with diabetes    Mean Plasma Glucose 10/04/2021 154.2  mg/dL Final   Performed at [facility], [address], [place], [place] [address]   Cholesterol 10/04/2021 178  0 - 200 mg/dL Final   Triglycerides 24/26/8341 155 (H)  <150 mg/dL Final   HDL 96/22/2979 52  >40 mg/dL Final   Total CHOL/HDL Ratio 10/04/2021 3.4  RATIO Final   VLDL 10/04/2021 31  0 - 40 mg/dL Final   LDL Cholesterol 10/04/2021 95  0 - 99 mg/dL Final   Comment:        Total Cholesterol/HDL:CHD Risk Coronary Heart Disease Risk Table                     Men   Women  1/2 Average Risk   3.4   3.3  Average Risk       5.0   4.4  2 X Average Risk   9.6   7.1  3 X Average Risk  23.4   11.0        Use the calculated Patient Ratio above and the CHD Risk Table to determine the patient's CHD Risk.        ATP III CLASSIFICATION (LDL):  <100     mg/dL   Optimal  892-119  mg/dL   Near or Above                    Optimal  130-159  mg/dL   Borderline  417-408  mg/dL   High  >144     mg/dL   Very High Performed at [facility], [address], [place], [place] [address]    POC Amphetamine UR 10/04/2021 None Detected  NONE DETECTED (Cut Off Level 1000 ng/mL) Final   POC Secobarbital (BAR) 10/04/2021 None Detected  NONE DETECTED (Cut Off Level 300 ng/mL) Final   POC Buprenorphine (BUP) 10/04/2021 None Detected  NONE DETECTED (Cut Off  Level 10 ng/mL) Final   POC Oxazepam (BZO) 10/04/2021 None Detected  NONE DETECTED (Cut Off Level 300 ng/mL) Final   POC Cocaine UR 10/04/2021 None Detected  NONE DETECTED (Cut Off Level 300 ng/mL) Final   POC Methamphetamine UR 10/04/2021 None Detected  NONE DETECTED (Cut Off Level 1000 ng/mL) Final   POC Morphine 10/04/2021 None Detected  NONE DETECTED (Cut Off Level 300 ng/mL) Final   POC Methadone UR 10/04/2021 None Detected  NONE DETECTED (Cut Off Level 300 ng/mL) Final   POC Oxycodone UR 10/04/2021 Positive (A)  NONE DETECTED (Cut Off Level 100 ng/mL) Final   POC Marijuana UR 10/04/2021 None Detected  NONE DETECTED (Cut Off Level 50 ng/mL) Final   SARSCOV2ONAVIRUS 2 AG 10/04/2021 NEGATIVE  NEGATIVE Final   Comment: (NOTE) SARS-CoV-2 antigen NOT DETECTED.   Negative results are presumptive.  Negative results do not preclude SARS-CoV-2 infection and should not be used as the sole basis for treatment or other patient management decisions, including infection  control decisions, particularly in the presence of clinical signs and  symptoms consistent with COVID-19, or in those who have been in contact with the virus.  Negative results must be combined with clinical observations, patient history, and epidemiological information. The expected result is Negative.  Fact Sheet for Patients: https://www.jennings-kim.com/  Fact Sheet for Healthcare Providers: https://alexander-rogers.biz/  This test is not yet approved or cleared by the [place] FDA and  has been authorized for detection and/or diagnosis of SARS-CoV-2 by FDA under an Emergency Use Authorization (EUA).  This EUA will remain in effect (meaning this test can be used) for the duration of  the COV  ID-19 declaration under Section 564(b)(1) of the Act, 21 U.S.C. section 360bbb-3(b)(1), unless the authorization is terminated or revoked sooner.     Valproic Acid Lvl  10/04/2021 51  50.0 - 100.0 ug/mL Final   Performed at [facility], [address], [place], [place] [address]   Valproic Acid Lvl 10/08/2021 57  50.0 - 100.0 ug/mL Final   Performed at [facility], [address], [place], [place] [address]   Glucose-Capillary 10/09/2021 123 (H)  70 - 99 mg/dL Final   Glucose reference range applies only to samples taken after fasting for at least 8 hours.  Admission on 09/09/2021, Discharged on 09/10/2021  Component Date Value Ref Range Status   Sodium 09/09/2021 134 (L)  135 - 145 mmol/L Final   Potassium 09/09/2021 4.3  3.5 - 5.1 mmol/L Final   Chloride 09/09/2021 99  98 - 111 mmol/L Final   CO2 09/09/2021 25  22 - 32 mmol/L Final   Glucose, Bld 09/09/2021 107 (H)  70 - 99 mg/dL Final   Glucose reference range applies only to samples taken after fasting for at least 8 hours.   BUN 09/09/2021 12  6 - 20 mg/dL Final   Creatinine, Ser 09/09/2021 0.74  0.44 - 1.00 mg/dL Final   Calcium 19/62/2297 9.0  8.9 - 10.3 mg/dL Final   Total Protein 98/92/1194 7.4  6.5 - 8.1 g/dL Final   Albumin 17/40/8144 3.1 (L)  3.5 - 5.0 g/dL Final   AST 81/85/6314 13 (L)  15 - 41 U/L Final   ALT 09/09/2021 13  0 - 44 U/L Final   Alkaline Phosphatase 09/09/2021 66  38 - 126 U/L Final   Total Bilirubin 09/09/2021 0.2 (L)  0.3 - 1.2 mg/dL Final   GFR, Estimated 09/09/2021 >60  >60 mL/min Final   Comment: (NOTE) Calculated using the CKD-EPI Creatinine Equation (2021)    Anion gap 09/09/2021 10  5 - 15 Final   Performed at [facility], [address] [place], [place] [address]   WBC 09/09/2021 8.5  4.0 - 10.5 K/uL Final   RBC 09/09/2021 4.53  3.87 - 5.11 MIL/uL Final   Hemoglobin 09/09/2021 11.8 (L)  12.0 - 15.0 g/dL Final   HCT 37/85/8850 38.0  36.0 - 46.0 % Final   MCV 09/09/2021 83.9  80.0 - 100.0 fL Final   MCH 09/09/2021 26.0  26.0 - 34.0 pg Final   MCHC 09/09/2021 31.1  30.0 - 36.0 g/dL Final   RDW 27/74/1287 17.8 (H)  11.5 - 15.5 % Final    Platelets 09/09/2021 442 (H)  150 - 400 K/uL Final   nRBC 09/09/2021 0.0  0.0 - 0.2 % Final   Neutrophils Relative % 09/09/2021 70  % Final   Neutro Abs 09/09/2021 5.9  1.7 - 7.7 K/uL Final   Lymphocytes Relative 09/09/2021 20  % Final   Lymphs Abs 09/09/2021 1.7  0.7 - 4.0 K/uL Final   Monocytes Relative 09/09/2021 8  % Final   Monocytes Absolute 09/09/2021 0.7  0.1 - 1.0 K/uL Final   Eosinophils Relative 09/09/2021 1  % Final   Eosinophils Absolute 09/09/2021 0.1  0.0 - 0.5 K/uL Final   Basophils Relative 09/09/2021 1  % Final   Basophils Absolute 09/09/2021 0.1  0.0 - 0.1 K/uL Final   Immature Granulocytes 09/09/2021 0  % Final   Abs Immature Granulocytes 09/09/2021 0.03  0.00 - 0.07 K/uL Final   Performed at [facility]  Lab, [address] [place], [place] [address]   Ammonia 09/09/2021 37 (H)  9 - 35 umol/L Final   Comment: HEMOLYSIS AT THIS LEVEL MAY AFFECT RESULT Performed at [facility], [address] [place], [place] [address]    Opiates 09/09/2021 NONE DETECTED  NONE DETECTED Final   Cocaine 09/09/2021 NONE DETECTED  NONE DETECTED Final   Benzodiazepines 09/09/2021 NONE DETECTED  NONE DETECTED Final   Amphetamines 09/09/2021 NONE DETECTED  NONE DETECTED Final   Tetrahydrocannabinol 09/09/2021 NONE DETECTED  NONE DETECTED Final   Barbiturates 09/09/2021 NONE DETECTED  NONE DETECTED Final   Comment: (NOTE) DRUG SCREEN FOR MEDICAL PURPOSES ONLY.  IF CONFIRMATION IS NEEDED FOR ANY PURPOSE, NOTIFY LAB WITHIN 5 DAYS.  LOWEST DETECTABLE LIMITS FOR URINE DRUG SCREEN Drug Class                     Cutoff (ng/mL) Amphetamine and metabolites    1000 Barbiturate and metabolites    200 Benzodiazepine                 200 Tricyclics and metabolites     300 Opiates and metabolites        300 Cocaine and metabolites        300 THC                            50 Performed at [facility], [address] [place], [place] [address]    Alcohol, Ethyl (B) 09/09/2021  <10  <10 mg/dL Final   Comment: (NOTE) Lowest detectable limit for serum alcohol is 10 mg/dL.  For medical purposes only. Performed at [facility], [address] [place], [place] [address]    Lipase 09/09/2021 33  11 - 51 U/L Final   Performed at [facility], [address] [place], [place] [address]   Color, Urine 09/09/2021 COLORLESS (A)  YELLOW Final   APPearance 09/09/2021 CLEAR  CLEAR Final   Specific Gravity, Urine 09/09/2021 1.002 (L)  1.005 - 1.030 Final   pH 09/09/2021 6.0  5.0 - 8.0 Final   Glucose, UA 09/09/2021 NEGATIVE  NEGATIVE mg/dL Final   Hgb urine dipstick 09/09/2021 NEGATIVE  NEGATIVE Final   Bilirubin Urine 09/09/2021 NEGATIVE  NEGATIVE Final   Ketones, ur 09/09/2021 NEGATIVE  NEGATIVE mg/dL Final   Protein, ur 69/62/952807/01/2022 NEGATIVE  NEGATIVE mg/dL Final   Nitrite 41/32/440107/01/2022 NEGATIVE  NEGATIVE Final   Leukocytes,Ua 09/09/2021 NEGATIVE  NEGATIVE Final   Performed at [facility], [address] [place], [place] [address]   Specimen Description 09/09/2021 URINE, CLEAN CATCH   Final   Special Requests 09/09/2021 NONE   Final   Culture 09/09/2021  (A)   Final                   Value:<10,000 COLONIES/mL INSIGNIFICANT GROWTH Performed at [facility], [address] [place], [place] [address]    Report Status 09/09/2021 09/10/2021 FINAL   Final   D-Dimer, Quant 09/09/2021 0.92 (H)  0.00 - 0.50 ug/mL-FEU Final   Comment: (NOTE) At the manufacturer cut-off value of 0.5 g/mL FEU, this assay has a negative predictive value of 95-100%.This assay is intended for use in conjunction with a clinical pretest probability (PTP) assessment model to exclude pulmonary embolism (PE) and deep venous thrombosis (DVT) in outpatients suspected of PE or DVT. Results should be  correlated with clinical presentation. Performed at [facility], [address], [place], [place] [address]    Troponin I (High Sensitivity) 09/09/2021 5  <18 ng/L Final    Comment: (NOTE) Elevated high sensitivity troponin I (hsTnI) values and significant  changes across serial measurements may suggest ACS but many other  chronic and acute conditions are known to elevate hsTnI results.  Refer to the "Links" section for chest pain algorithms and additional  guidance. Performed at [facility], [address], [place], [place] [address]    BP 09/10/2021 135/83  mmHg Final   S' Lateral 09/10/2021 2.30  cm Final   Area-P 1/2 09/10/2021 5.58  cm2 Final    Blood Alcohol level:  Lab Results  Component Value Date   ETH <10 09/09/2021   ETH <10 10/20/2020    Metabolic Disorder Labs: Lab Results  Component Value Date   HGBA1C 7.0 (H) 10/04/2021   MPG 154.2 10/04/2021   No results found for: "PROLACTIN" Lab Results  Component Value Date   CHOL 178 10/04/2021   TRIG 155 (H) 10/04/2021   HDL 52 10/04/2021   CHOLHDL 3.4 10/04/2021   VLDL 31 10/04/2021   LDLCALC 95 10/04/2021    Therapeutic Lab Levels: No results found for: "LITHIUM" Lab Results  Component Value Date   VALPROATE 57 10/08/2021   VALPROATE 51 10/04/2021   No results found for: "CBMZ"  Physical Findings   Flowsheet Row ED from 10/04/2021 in [facility] ED from 09/09/2021 in [facility] EMERGENCY DEPARTMENT ED from 08/22/2021 in [facility] Dept  C-SSRS RISK CATEGORY No Risk No Risk No Risk        Musculoskeletal  Strength & Muscle Tone: within normal limits Gait & Station: normal Patient leans: N/A  Psychiatric Specialty Exam  Presentation  General Appearance: Appropriate for Environment  Eye Contact:Good  Speech:Clear and Coherent  Speech Volume:Normal  Handedness:Right   Mood and Affect  Mood:Euthymic  Affect:Constricted   Thought Process  Thought Processes:Coherent; Linear  Descriptions of Associations:Intact  Orientation:Full (Time, Place and Person)  Thought Content:Logical   Diagnosis of Schizophrenia or Schizoaffective disorder in past: Yes  Duration of Psychotic Symptoms: Greater than six months   Hallucinations: denies  Ideas of Reference:None  Suicidal Thoughts: denies  Homicidal Thoughts: denies   Sensorium  Memory:Immediate Fair; Recent Fair; Remote Fair  Judgment:Poor  Insight:Poor   Executive Functions  Concentration:Poor  Attention Span:Poor  Recall:Poor  Fund of Knowledge:Poor  Language:Poor   Psychomotor Activity  Psychomotor Activity: normal   Assets  Assets:Communication Skills; Desire for Improvement   Sleep  Sleep: fair   Physical Exam  Physical Exam Constitutional:      Appearance: the patient is not toxic-appearing.  Pulmonary:     Effort: Pulmonary effort is normal.  Neurological:     General: No focal deficit present.     Mental Status: the patient is alert and oriented to person, place, and time.   Review of Systems  Respiratory:  Negative for shortness of breath.   Cardiovascular:  Negative for chest pain.  Gastrointestinal:  Negative for abdominal pain, constipation, diarrhea, nausea and vomiting.  Neurological:  Negative for headaches.   Blood pressure 116/83, pulse (!) 102, temperature 98 F (36.7 C), temperature source Oral, resp. rate 20, SpO2 100 %. There is no height or weight on file to calculate BMI.  Treatment Plan Summary: Daily contact with patient to assess and evaluate symptoms and progress  in treatment and Medication management  Status: Voluntary Disposition: TBD, difficult to place given ID diagnosis and schizophrenia with aggression -LCSW working on disposition planning  Hx of schizophrenia and aggression Continue home medications as listed - Discontinued Haldol 10 mg 3 times daily because this is not currently being prescribed by her outpatient provider -- Continue Zyprexa 20 mg QHS (in place of Lybalvi) - Continue Cogentin 1 mg twice daily - Continue Clozapine 50 mg twice  daily, ACTT confirms patient is prescribed this medicine - Ingrezza 40 mg daily - Depakote extended release 500 mg twice daily    Labs: VPA trough of 51 8/6, 57 on 8/10 UDS with oxy A1C of 7 Hgb of 11.6 MCV of 80, will need outpatient follow up   Carlyn Reichert, MD 10/20/2021 6:26 PM

## 2021-10-20 NOTE — ED Notes (Signed)
Patient A&Ox4. Patient denies SI/Hi and AVH.  Patient denies any physical complaints when asked. No acute distress noted. Support and encouragement provided. Routine safety checks conducted according to facility protocol. Encouraged patient to notify staff if thoughts of harm toward self or others arise. Patient verbalize understanding and agreement. Will continue to monitor for safety.    

## 2021-10-20 NOTE — ED Notes (Signed)
Dinner given

## 2021-10-20 NOTE — ED Notes (Signed)
Pt sleeping at present, no distress noted, monitoring for safety. 

## 2021-10-20 NOTE — ED Notes (Signed)
Pt is in the bed sleeping. Respirations are even and unlabored. No acute distress noted. Will continue to monitor for safety. 

## 2021-10-20 NOTE — ED Notes (Signed)
Patient resting quietly in bed with eyes closed. Respirations equal and unlabored, skin warm and dry, NAD. No change in assessment or acuity. Routine safety checks conducted according to facility protocol. Will continue to monitor for safety.   

## 2021-10-21 NOTE — ED Notes (Signed)
Pt was given a salad for lunch

## 2021-10-21 NOTE — ED Notes (Signed)
Patient resting quietly in bed with eyes closed. Respirations equal and unlabored, skin warm and dry, NAD. No change in assessment or acuity. Routine safety checks conducted according to facility protocol. Will continue to monitor for safety.   

## 2021-10-21 NOTE — ED Notes (Signed)
Patient resting quietly in bed watching TV. Respirations equal and unlabored, skin warm and dry, NAD. No change in assessment or acuity. Routine safety checks conducted according to facility protocol. Will continue to monitor for safety.   

## 2021-10-21 NOTE — ED Notes (Signed)
Continues to make request for food and fluid from any staff member that would listen and after four request were granted Abigail was reminded that breakfast comes in the morning.

## 2021-10-21 NOTE — ED Notes (Signed)
Patient A&Ox4. Denies intent to harm self/others when asked. Denies A/VH. Patient denies any physical complaints when asked. No acute distress noted. Support and encouragement provided. Routine safety checks conducted according to facility protocol. Encouraged patient to notify staff if thoughts of harm toward self or others arise. Patient verbalize understanding and agreement. Will continue to monitor for safety.    

## 2021-10-21 NOTE — ED Notes (Signed)
Patient sitting talking with peers appropriately Respirations equal and unlabored, skin warm and dry, NAD. No change in assessment or acuity. Routine safety checks conducted according to facility protocol. Will continue to monitor for safety.

## 2021-10-21 NOTE — ED Notes (Signed)
Pt is in the bed sleeping. Respirations are even and unlabored. No acute distress noted. Will continue to monitor for safety. 

## 2021-10-21 NOTE — ED Provider Notes (Signed)
Behavioral Health Progress Note  Date and Time: 10/21/2021 1:23 PM Name: Teresa Murillo MRN:  [id number]  Subjective:   Teresa Murillo is a 61 year old female with a past psychiatric history of schizophrenia, aggressive behavior, and possible intellectual disability.  She presented to the [facility] behavioral health urgent care after an argument with staff members at her group home that resulted in her signing discharge paperwork and leaving.  Per previous documentation, the group home staff members have reported that the patient has a history of intellectual disability.  Cannot find this documented elsewhere in the chart.  It is certainly possible, as the patient is unable to state any of her home medications and is unable to perform simple math problems.  The patient reports that she receives a disability check each month. The patient is connected with [facility] but does not receive ACT services.  The patient has a care coordinator with [facility] named Ms. Green, with whom the LCSW has frequently been communicating with to determine proper disposition for the patient.   On assessment this morning, the patient is calm and cooperative.  Per nursing documentation the patient has also been calm and cooperative.  Presently she does not appear to be psychotic and she does not admit to any auditory or visual hallucinations.  She denies suicidal or homicidal thoughts.  She reports appropriate mood, sleep, and appetite.   FL2 filled out and sent to patient's former [facility]. Awaiting their response.      Diagnosis:  Final diagnoses:  Schizophrenia, unspecified type ([place])    Total Time spent with patient: 15 minutes  Past Psychiatric History: as above Past Medical History:  Past Medical History:  Diagnosis Date   Chronic obstructive pulmonary disease (COPD) ([place])    Glaucoma    Hyperlipidemia    Hypertension    Iron deficiency    Schizoaffective disorder ([place])    Type 2 diabetes  mellitus ([place])     Past Surgical History:  Procedure Laterality Date   TUBAL LIGATION     Family History:  Family History  Problem Relation Age of Onset   Breast cancer Maternal Grandmother    Family Psychiatric  History: as per H and P Social History:  Social History   Substance and Sexual Activity  Alcohol Use Yes     Social History   Substance and Sexual Activity  Drug Use Not Currently    Social History   Socioeconomic History   Marital status: Divorced    Spouse name: Not on file   Number of children: Not on file   Years of education: Not on file   Highest education level: Not on file  Occupational History   Not on file  Tobacco Use   Smoking status: Every Day    Packs/day: 1.00    Types: Cigars, Cigarettes   Smokeless tobacco: Current  Vaping Use   Vaping Use: Never used  Substance and Sexual Activity   Alcohol use: Yes   Drug use: Not Currently   Sexual activity: Not Currently    Birth control/protection: Surgical  Other Topics Concern   Not on file  Social History Narrative   Not on file   Social Determinants of Health   Financial Resource Strain: Not on file  Food Insecurity: Not on file  Transportation Needs: Not on file  Physical Activity: Not on file  Stress: Not on file  Social Connections: Not on file   SDOH:  SDOH Screenings   Alcohol Screen: Not on  file  Depression (PHQ2-9): Not on file  Financial Resource Strain: Not on file  Food Insecurity: Not on file  Housing: Not on file  Physical Activity: Not on file  Social Connections: Not on file  Stress: Not on file  Tobacco Use: High Risk (09/09/2021)   Patient History    Smoking Tobacco Use: Every Day    Smokeless Tobacco Use: Current    Passive Exposure: Not on file  Transportation Needs: Not on file   Additional Social History:    Pain Medications: See MAR Prescriptions: See MAR Over the Counter: See MAR History of alcohol / drug use?: No history of alcohol / drug  abuse Longest period of sobriety (when/how long): N/A Negative Consequences of Use:  (N/A) Withdrawal Symptoms:  (N/A)                    Sleep: Fair  Appetite:  Fair  Current Medications:  Current Facility-Administered Medications  Medication Dose Route Frequency Provider Last Rate Last Admin   acetaminophen (TYLENOL) tablet 650 mg  650 mg Oral Q6H PRN Tharon Aquas, NP   650 mg at 10/21/21 1241   alum & mag hydroxide-simeth (MAALOX/MYLANTA) 200-200-20 MG/5ML suspension 30 mL  30 mL Oral Q4H PRN Tharon Aquas, NP       benztropine (COGENTIN) tablet 1 mg  1 mg Oral BID Tharon Aquas, NP   1 mg at 10/21/21 0915   cloZAPine (CLOZARIL) tablet 50 mg  50 mg Oral BID Tharon Aquas, NP   50 mg at 10/21/21 0915   divalproex (DEPAKOTE ER) 24 hr tablet 500 mg  500 mg Oral BID Tharon Aquas, NP   500 mg at 10/21/21 0915   hydrOXYzine (ATARAX) tablet 50 mg  50 mg Oral TID PRN Corky Sox, MD   50 mg at 10/14/21 1416   latanoprost (XALATAN) 0.005 % ophthalmic solution 1 drop  1 drop Both Eyes QHS Tharon Aquas, NP   1 drop at 10/20/21 2115   lisinopril (ZESTRIL) tablet 10 mg  10 mg Oral Daily Tharon Aquas, NP   10 mg at 10/21/21 0915   magnesium hydroxide (MILK OF MAGNESIA) suspension 30 mL  30 mL Oral Daily PRN Tharon Aquas, NP       metFORMIN (GLUCOPHAGE) tablet 500 mg  500 mg Oral BID WC Tharon Aquas, NP   500 mg at 10/21/21 0744   mometasone-formoterol (DULERA) 200-5 MCG/ACT inhaler 2 puff  2 puff Inhalation BID Tharon Aquas, NP   2 puff at 10/21/21 0744   nicotine (NICODERM CQ - dosed in mg/24 hr) patch 7 mg  7 mg Transdermal Daily Tharon Aquas, NP   7 mg at 10/21/21 0917   OLANZapine (ZYPREXA) tablet 20 mg  20 mg Oral QHS Corky Sox, MD   20 mg at 10/20/21 2112   valbenazine (INGREZZA) capsule 40 mg  40 mg Oral q AM Tharon Aquas, NP   40 mg at 10/21/21 [id number]   Current Outpatient Medications  Medication  Sig Dispense Refill   benztropine (COGENTIN) 1 MG tablet Take 1 mg by mouth in the morning and at bedtime.     budesonide-formoterol (SYMBICORT) 160-4.5 MCG/ACT inhaler Inhale 2 puffs into the lungs in the morning and at bedtime.     Cholecalciferol (VITAMIN D3) 1.25 MG (50000 UT) CAPS Take 50,000 Units by mouth every Thursday.     clozapine (CLOZARIL) 50 MG tablet Take 50 mg by mouth  2 (two) times daily.     divalproex (DEPAKOTE ER) 500 MG 24 hr tablet Take 500 mg by mouth in the morning and at bedtime.     haloperidol (HALDOL) 10 MG tablet Take 10 mg by mouth 3 (three) times daily.     INGREZZA 40 MG capsule Take 40 mg by mouth in the morning.     latanoprost (XALATAN) 0.005 % ophthalmic solution Place 1 drop into both eyes at bedtime.     lisinopril (ZESTRIL) 10 MG tablet Take 10 mg by mouth daily.     metFORMIN (GLUCOPHAGE) 500 MG tablet Take 500 mg by mouth 2 (two) times daily with a meal.     nicotine (NICODERM CQ - DOSED IN MG/24 HR) 7 mg/24hr patch Place 7 mg onto the skin daily.     OLANZapine-Samidorphan (LYBALVI) 20-10 MG TABS Take 1 tablet by mouth at bedtime.     PROAIR HFA 108 (90 Base) MCG/ACT inhaler Inhale 2 puffs into the lungs every 6 (six) hours as needed for wheezing or shortness of breath.     zolpidem (AMBIEN) 10 MG tablet Take 10 mg by mouth at bedtime.      Labs  Lab Results:  Admission on 10/04/2021  Component Date Value Ref Range Status   SARS Coronavirus 2 by RT PCR 10/04/2021 NEGATIVE  NEGATIVE Final   Comment: (NOTE) SARS-CoV-2 target nucleic acids are NOT DETECTED.  The SARS-CoV-2 RNA is generally detectable in upper respiratory specimens during the acute phase of infection. The lowest concentration of SARS-CoV-2 viral copies this assay can detect is 138 copies/mL. A negative result does not preclude SARS-Cov-2 infection and should not be used as the sole basis for treatment or other patient management decisions. A negative result may occur with  improper  specimen collection/handling, submission of specimen other than nasopharyngeal swab, presence of viral mutation(s) within the areas targeted by this assay, and inadequate number of viral copies(<138 copies/mL). A negative result must be combined with clinical observations, patient history, and epidemiological information. The expected result is Negative.  Fact Sheet for Patients:  BloggerCourse.com  Fact Sheet for Healthcare Providers:  SeriousBroker.it  This test is no                          t yet approved or cleared by the [place] FDA and  has been authorized for detection and/or diagnosis of SARS-CoV-2 by FDA under an Emergency Use Authorization (EUA). This EUA will remain  in effect (meaning this test can be used) for the duration of the COVID-19 declaration under Section 564(b)(1) of the Act, 21 U.S.C.section 360bbb-3(b)(1), unless the authorization is terminated  or revoked sooner.       Influenza A by PCR 10/04/2021 NEGATIVE  NEGATIVE Final   Influenza B by PCR 10/04/2021 NEGATIVE  NEGATIVE Final   Comment: (NOTE) The Xpert Xpress SARS-CoV-2/FLU/RSV plus assay is intended as an aid in the diagnosis of influenza from Nasopharyngeal swab specimens and should not be used as a sole basis for treatment. Nasal washings and aspirates are unacceptable for Xpert Xpress SARS-CoV-2/FLU/RSV testing.  Fact Sheet for Patients: BloggerCourse.com  Fact Sheet for Healthcare Providers: SeriousBroker.it  This test is not yet approved or cleared by the [place] FDA and has been authorized for detection and/or diagnosis of SARS-CoV-2 by FDA under an Emergency Use Authorization (EUA). This EUA will remain in effect (meaning this test can be used) for the duration of the COVID-19 declaration  under Section 564(b)(1) of the Act, 21 U.S.C. section 360bbb-3(b)(1), unless the  authorization is terminated or revoked.  Performed at [facility], [place] [address] [place], [place] [address]    WBC 10/04/2021 8.4  4.0 - 10.5 K/uL Final   RBC 10/04/2021 4.42  3.87 - 5.11 MIL/uL Final   Hemoglobin 10/04/2021 11.6 (L)  12.0 - 15.0 g/dL Final   HCT 10/04/2021 35.7 (L)  36.0 - 46.0 % Final   MCV 10/04/2021 80.8  80.0 - 100.0 fL Final   MCH 10/04/2021 26.2  26.0 - 34.0 pg Final   MCHC 10/04/2021 32.5  30.0 - 36.0 g/dL Final   RDW 10/04/2021 16.0 (H)  11.5 - 15.5 % Final   Platelets 10/04/2021 372  150 - 400 K/uL Final   nRBC 10/04/2021 0.0  0.0 - 0.2 % Final   Neutrophils Relative % 10/04/2021 68  % Final   Neutro Abs 10/04/2021 5.7  1.7 - 7.7 K/uL Final   Lymphocytes Relative 10/04/2021 22  % Final   Lymphs Abs 10/04/2021 1.8  0.7 - 4.0 K/uL Final   Monocytes Relative 10/04/2021 8  % Final   Monocytes Absolute 10/04/2021 0.7  0.1 - 1.0 K/uL Final   Eosinophils Relative 10/04/2021 1  % Final   Eosinophils Absolute 10/04/2021 0.1  0.0 - 0.5 K/uL Final   Basophils Relative 10/04/2021 1  % Final   Basophils Absolute 10/04/2021 0.1  0.0 - 0.1 K/uL Final   Immature Granulocytes 10/04/2021 0  % Final   Abs Immature Granulocytes 10/04/2021 0.03  0.00 - 0.07 K/uL Final   Performed at [facility], [place] [address], [place], [place] [address]   Sodium 10/04/2021 136  135 - 145 mmol/L Final   Potassium 10/04/2021 4.2  3.5 - 5.1 mmol/L Final   Chloride 10/04/2021 104  98 - 111 mmol/L Final   CO2 10/04/2021 25  22 - 32 mmol/L Final   Glucose, Bld 10/04/2021 100 (H)  70 - 99 mg/dL Final   Glucose reference range applies only to samples taken after fasting for at least 8 hours.   BUN 10/04/2021 9  6 - 20 mg/dL Final   Creatinine, Ser 10/04/2021 0.52  0.44 - 1.00 mg/dL Final   Calcium 10/04/2021 9.0  8.9 - 10.3 mg/dL Final   Total Protein 10/04/2021 7.0  6.5 - 8.1 g/dL Final   Albumin 10/04/2021 3.2 (L)  3.5 - 5.0 g/dL Final   AST 10/04/2021 13 (L)  15 - 41 U/L  Final   ALT 10/04/2021 10  0 - 44 U/L Final   Alkaline Phosphatase 10/04/2021 61  38 - 126 U/L Final   Total Bilirubin 10/04/2021 0.3  0.3 - 1.2 mg/dL Final   GFR, Estimated 10/04/2021 >60  >60 mL/min Final   Comment: (NOTE) Calculated using the CKD-EPI Creatinine Equation (2021)    Anion gap 10/04/2021 7  5 - 15 Final   Performed at [place] [address], [place], [place] [address]   Hgb A1c MFr Bld 10/04/2021 7.0 (H)  4.8 - 5.6 % Final   Comment: (NOTE) Pre diabetes:          5.7%-6.4%  Diabetes:              >6.4%  Glycemic control for   <7.0% adults with diabetes    Mean Plasma Glucose 10/04/2021 154.2  mg/dL Final   Performed at [facility], [place] [address] [place], [place] [address]  Cholesterol 10/04/2021 178  0 - 200 mg/dL Final   Triglycerides 10/04/2021 155 (H)  <150 mg/dL Final   HDL 10/04/2021 52  >40 mg/dL Final   Total CHOL/HDL Ratio 10/04/2021 3.4  RATIO Final   VLDL 10/04/2021 31  0 - 40 mg/dL Final   LDL Cholesterol 10/04/2021 95  0 - 99 mg/dL Final   Comment:        Total Cholesterol/HDL:CHD Risk Coronary Heart Disease Risk Table                     Men   Women  1/2 Average Risk   3.4   3.3  Average Risk       5.0   4.4  2 X Average Risk   9.6   7.1  3 X Average Risk  23.4   11.0        Use the calculated Patient Ratio above and the CHD Risk Table to determine the patient's CHD Risk.        ATP III CLASSIFICATION (LDL):  <100     mg/dL   Optimal  100-129  mg/dL   Near or Above                    Optimal  130-159  mg/dL   Borderline  160-189  mg/dL   High  >190     mg/dL   Very High Performed at [facility] [address], [place], [place] [address]    POC Amphetamine UR 10/04/2021 None Detected  NONE DETECTED (Cut Off Level 1000 ng/mL) Final   POC Secobarbital (BAR) 10/04/2021 None Detected  NONE DETECTED (Cut Off Level 300 ng/mL) Final   POC Buprenorphine (BUP) 10/04/2021 None Detected  NONE DETECTED (Cut Off Level 10  ng/mL) Final   POC Oxazepam (BZO) 10/04/2021 None Detected  NONE DETECTED (Cut Off Level 300 ng/mL) Final   POC Cocaine UR 10/04/2021 None Detected  NONE DETECTED (Cut Off Level 300 ng/mL) Final   POC Methamphetamine UR 10/04/2021 None Detected  NONE DETECTED (Cut Off Level 1000 ng/mL) Final   POC Morphine 10/04/2021 None Detected  NONE DETECTED (Cut Off Level 300 ng/mL) Final   POC Methadone UR 10/04/2021 None Detected  NONE DETECTED (Cut Off Level 300 ng/mL) Final   POC Oxycodone UR 10/04/2021 Positive (A)  NONE DETECTED (Cut Off Level 100 ng/mL) Final   POC Marijuana UR 10/04/2021 None Detected  NONE DETECTED (Cut Off Level 50 ng/mL) Final   SARSCOV2ONAVIRUS 2 AG 10/04/2021 NEGATIVE  NEGATIVE Final   Comment: (NOTE) SARS-CoV-2 antigen NOT DETECTED.   Negative results are presumptive.  Negative results do not preclude SARS-CoV-2 infection and should not be used as the sole basis for treatment or other patient management decisions, including infection  control decisions, particularly in the presence of clinical signs and  symptoms consistent with COVID-19, or in those who have been in contact with the virus.  Negative results must be combined with clinical observations, patient history, and epidemiological information. The expected result is Negative.  Fact Sheet for Patients: HandmadeRecipes.com.cy  Fact Sheet for Healthcare Providers: FuneralLife.at  This test is not yet approved or cleared by the [place] FDA and  has been authorized for detection and/or diagnosis of SARS-CoV-2 by FDA under an Emergency Use Authorization (EUA).  This EUA will remain in effect (meaning this test can be used) for the duration of  the COV  ID-19 declaration under Section 564(b)(1) of the Act, 21 U.S.C. section 360bbb-3(b)(1), unless the authorization is terminated or revoked sooner.     Valproic Acid Lvl 10/04/2021 51   50.0 - 100.0 ug/mL Final   Performed at [facility], [address] [place], [place] [address]   Valproic Acid Lvl 10/08/2021 57  50.0 - 100.0 ug/mL Final   Performed at [facility], [address], [place], [place] [address]   Glucose-Capillary 10/09/2021 123 (H)  70 - 99 mg/dL Final   Glucose reference range applies only to samples taken after fasting for at least 8 hours.  Admission on 09/09/2021, Discharged on 09/10/2021  Component Date Value Ref Range Status   Sodium 09/09/2021 134 (L)  135 - 145 mmol/L Final   Potassium 09/09/2021 4.3  3.5 - 5.1 mmol/L Final   Chloride 09/09/2021 99  98 - 111 mmol/L Final   CO2 09/09/2021 25  22 - 32 mmol/L Final   Glucose, Bld 09/09/2021 107 (H)  70 - 99 mg/dL Final   Glucose reference range applies only to samples taken after fasting for at least 8 hours.   BUN 09/09/2021 12  6 - 20 mg/dL Final   Creatinine, Ser 09/09/2021 0.74  0.44 - 1.00 mg/dL Final   Calcium 95/28/4132 9.0  8.9 - 10.3 mg/dL Final   Total Protein 44/03/270 7.4  6.5 - 8.1 g/dL Final   Albumin 53/66/4403 3.1 (L)  3.5 - 5.0 g/dL Final   AST 47/42/5956 13 (L)  15 - 41 U/L Final   ALT 09/09/2021 13  0 - 44 U/L Final   Alkaline Phosphatase 09/09/2021 66  38 - 126 U/L Final   Total Bilirubin 09/09/2021 0.2 (L)  0.3 - 1.2 mg/dL Final   GFR, Estimated 09/09/2021 >60  >60 mL/min Final   Comment: (NOTE) Calculated using the CKD-EPI Creatinine Equation (2021)    Anion gap 09/09/2021 10  5 - 15 Final   Performed at [facility], [address], [place], [place] [address]   WBC 09/09/2021 8.5  4.0 - 10.5 K/uL Final   RBC 09/09/2021 4.53  3.87 - 5.11 MIL/uL Final   Hemoglobin 09/09/2021 11.8 (L)  12.0 - 15.0 g/dL Final   HCT 43/32/9518 38.0  36.0 - 46.0 % Final   MCV 09/09/2021 83.9  80.0 - 100.0 fL Final   MCH 09/09/2021 26.0  26.0 - 34.0 pg Final   MCHC 09/09/2021 31.1  30.0 - 36.0 g/dL Final   RDW 84/16/6063 17.8 (H)  11.5 - 15.5 % Final   Platelets  09/09/2021 442 (H)  150 - 400 K/uL Final   nRBC 09/09/2021 0.0  0.0 - 0.2 % Final   Neutrophils Relative % 09/09/2021 70  % Final   Neutro Abs 09/09/2021 5.9  1.7 - 7.7 K/uL Final   Lymphocytes Relative 09/09/2021 20  % Final   Lymphs Abs 09/09/2021 1.7  0.7 - 4.0 K/uL Final   Monocytes Relative 09/09/2021 8  % Final   Monocytes Absolute 09/09/2021 0.7  0.1 - 1.0 K/uL Final   Eosinophils Relative 09/09/2021 1  % Final   Eosinophils Absolute 09/09/2021 0.1  0.0 - 0.5 K/uL Final   Basophils Relative 09/09/2021 1  % Final   Basophils Absolute 09/09/2021 0.1  0.0 - 0.1 K/uL Final   Immature Granulocytes 09/09/2021 0  % Final   Abs Immature Granulocytes 09/09/2021 0.03  0.00 - 0.07 K/uL Final   Performed at [facility], [address], [place], [place] [address]   Ammonia 09/09/2021 37 (H)  9 - 35 umol/L Final   Comment: HEMOLYSIS AT THIS LEVEL MAY AFFECT RESULT Performed at [facility], [place] [address] [place], [place] [address]    Opiates 09/09/2021 NONE DETECTED  NONE DETECTED Final   Cocaine 09/09/2021 NONE DETECTED  NONE DETECTED Final   Benzodiazepines 09/09/2021 NONE DETECTED  NONE DETECTED Final   Amphetamines 09/09/2021 NONE DETECTED  NONE DETECTED Final   Tetrahydrocannabinol 09/09/2021 NONE DETECTED  NONE DETECTED Final   Barbiturates 09/09/2021 NONE DETECTED  NONE DETECTED Final   Comment: (NOTE) DRUG SCREEN FOR MEDICAL PURPOSES ONLY.  IF CONFIRMATION IS NEEDED FOR ANY PURPOSE, NOTIFY LAB WITHIN 5 DAYS.  LOWEST DETECTABLE LIMITS FOR URINE DRUG SCREEN Drug Class                     Cutoff (ng/mL) Amphetamine and metabolites    1000 Barbiturate and metabolites    200 Benzodiazepine                 [id number] Tricyclics and metabolites     300 Opiates and metabolites        300 Cocaine and metabolites        300 THC                            50 Performed at [facility], [place] [address], [place], [place] [address]    Alcohol, Ethyl (B) 09/09/2021 <10  <10  mg/dL Final   Comment: (NOTE) Lowest detectable limit for serum alcohol is 10 mg/dL.  For medical purposes only. Performed at [facility], [place] [address], [place], [place] [address]    Lipase 09/09/2021 33  11 - 51 U/L Final   Performed at [facility] [address], [place], [place] [address]   Color, Urine 09/09/2021 COLORLESS (A)  YELLOW Final   APPearance 09/09/2021 CLEAR  CLEAR Final   Specific Gravity, Urine 09/09/2021 1.002 (L)  1.005 - 1.030 Final   pH 09/09/2021 6.0  5.0 - 8.0 Final   Glucose, UA 09/09/2021 NEGATIVE  NEGATIVE mg/dL Final   Hgb urine dipstick 09/09/2021 NEGATIVE  NEGATIVE Final   Bilirubin Urine 09/09/2021 NEGATIVE  NEGATIVE Final   Ketones, ur 09/09/2021 NEGATIVE  NEGATIVE mg/dL Final   Protein, ur 09/09/2021 NEGATIVE  NEGATIVE mg/dL Final   Nitrite 09/09/2021 NEGATIVE  NEGATIVE Final   Leukocytes,Ua 09/09/2021 NEGATIVE  NEGATIVE Final   Performed at [facility] [address] [place], [place] [address]   Specimen Description 09/09/2021 URINE, CLEAN CATCH   Final   Special Requests 09/09/2021 NONE   Final   Culture 09/09/2021  (A)   Final                   Value:<10,000 COLONIES/mL INSIGNIFICANT GROWTH Performed at [facility], [place] [address], [place], [place] [address]    Report Status 09/09/2021 09/10/2021 FINAL   Final   D-Dimer, Quant 09/09/2021 0.92 (H)  0.00 - 0.50 ug/mL-FEU Final   Comment: (NOTE) At the manufacturer cut-off value of 0.5 g/mL FEU, this assay has a negative predictive value of 95-100%.This assay is intended for use in conjunction with a clinical pretest probability (PTP) assessment model to exclude pulmonary embolism (PE) and deep venous thrombosis (DVT) in outpatients suspected of PE or DVT. Results should be  correlated with clinical presentation. Performed at [facility], [address] [place], [place] [address]    Troponin I (High Sensitivity) 09/09/2021 5  <18 ng/L Final   Comment:  (NOTE) Elevated high sensitivity troponin I (hsTnI) values and significant  changes across serial measurements may suggest ACS but many other  chronic and acute conditions are known to elevate hsTnI results.  Refer to the "Links" section for chest pain algorithms and additional  guidance. Performed at [facility], [address] [place], [place] [address]    BP 09/10/2021 135/83  mmHg Final   S' Lateral 09/10/2021 2.30  cm Final   Area-P 1/2 09/10/2021 5.58  cm2 Final    Blood Alcohol level:  Lab Results  Component Value Date   ETH <10 09/09/2021   ETH <10 10/20/2020    Metabolic Disorder Labs: Lab Results  Component Value Date   HGBA1C 7.0 (H) 10/04/2021   MPG 154.2 10/04/2021   No results found for: "PROLACTIN" Lab Results  Component Value Date   CHOL 178 10/04/2021   TRIG 155 (H) 10/04/2021   HDL 52 10/04/2021   CHOLHDL 3.4 10/04/2021   VLDL 31 10/04/2021   LDLCALC 95 10/04/2021    Therapeutic Lab Levels: No results found for: "LITHIUM" Lab Results  Component Value Date   VALPROATE 57 10/08/2021   VALPROATE 51 10/04/2021   No results found for: "CBMZ"  Physical Findings   Flowsheet Row ED from 10/04/2021 in [facility] ED from 09/09/2021 in [facility] EMERGENCY DEPART[facility] ED from 08/22/2021 in [facility]  C-SSRS RISK CATEGORY No Risk No Risk No Risk        Musculoskeletal  Strength & Muscle Tone: within normal limits Gait & Station: normal Patient leans: N/A  Psychiatric Specialty Exam  Presentation  General Appearance: Appropriate for Environment  Eye Contact:Good  Speech:Clear and Coherent  Speech Volume:Normal  Handedness:Right   Mood and Affect  Mood:Euthymic  Affect:Constricted   Thought Process  Thought Processes:Coherent; Linear  Descriptions of Associations:Intact  Orientation:Full (Time, Place and Person)  Thought Content:Logical  Diagnosis  of Schizophrenia or Schizoaffective disorder in past: Yes  Duration of Psychotic Symptoms: Greater than six months   Hallucinations: denies  Ideas of Reference:None  Suicidal Thoughts: denies  Homicidal Thoughts: denies   Sensorium  Memory:Immediate Fair; Recent Fair; Remote Fair  Judgment:Poor  Insight:Poor   Executive Functions  Concentration:Poor  Attention Span:Poor  Recall:Poor  Fund of Knowledge:Poor  Language:Poor   Psychomotor Activity  Psychomotor Activity: normal   Assets  Assets:Communication Skills; Desire for Improvement   Sleep  Sleep: fair   Physical Exam  Physical Exam Constitutional:      Appearance: the patient is not toxic-appearing.  Pulmonary:     Effort: Pulmonary effort is normal.  Neurological:     General: No focal deficit present.     Mental Status: the patient is alert and oriented to person, place, and time.   Review of Systems  Respiratory:  Negative for shortness of breath.   Cardiovascular:  Negative for chest pain.  Gastrointestinal:  Negative for abdominal pain, constipation, diarrhea, nausea and vomiting.  Neurological:  Negative for headaches.   Blood pressure 114/84, pulse 89, temperature 97.9 F (36.6 C), temperature source Oral, resp. rate 16, SpO2 98 %. There is no height or weight on file to calculate BMI.  Treatment Plan Summary: Daily contact with patient to assess and evaluate symptoms and progress in  treatment and Medication management  Status: Voluntary Disposition: TBD, difficult to place given ID diagnosis and schizophrenia with aggression -LCSW working on disposition planning  Hx of schizophrenia and aggression Continue home medications as listed - Discontinued Haldol 10 mg 3 times daily because this is not currently being prescribed by her outpatient provider -- Continue Zyprexa 20 mg QHS (in place of Lybalvi) - Continue Cogentin 1 mg twice daily - Continue Clozapine 50 mg twice daily, ACTT  confirms patient is prescribed this medicine - Ingrezza 40 mg daily - Depakote extended release 500 mg twice daily    Labs: VPA trough of 51 8/6, 57 on 8/10 UDS with oxy A1C of 7 Hgb of 11.6 MCV of 80, will need outpatient follow up   Corky Sox, MD 10/21/2021 1:23 PM

## 2021-10-21 NOTE — BH Assessment (Addendum)
LCSW Progress Note   0836 - Contacted [facility] - no answer.  [phone] [facility], [phone] and left a voicemail requesting a call back.  1058 - LCSW contacted Ms. [name], [phone], regarding the completed and signed FL2 form.  LCSW informed Ms. [name] that the pt has not had the opportunity to contact [facility] and make a second attempt with being registered as IDD eligible because LCSW was unable to meet with her to assist her in completing this task.  LCSW informed Ms. [name] that this would need to be completed after she is discharged from the [facility].  LCSW scanned the FL2 form to the AFL facility at [email or web address].  EDP Dr. Jerrel Ivory will be updated with this information.   Hansel Starling, MSW, LCSW [facility] [phone] phone

## 2021-10-21 NOTE — ED Notes (Signed)
Teresa Murillo remains very focused on food and her up coming possible discharge. Pt make multiple request for snack food .

## 2021-10-22 ENCOUNTER — Ambulatory Visit: Payer: Medicaid Other | Admitting: Internal Medicine

## 2021-10-22 MED ORDER — MENTHOL 3 MG MT LOZG
1.0000 | LOZENGE | Freq: Once | OROMUCOSAL | Status: AC
  Administered 2021-10-22: 3 mg via ORAL
  Filled 2021-10-22: qty 9

## 2021-10-22 MED ORDER — DIVALPROEX SODIUM ER 500 MG PO TB24: 500.0000 mg | ORAL_TABLET | Freq: Two times a day (BID) | ORAL | 0 refills | Status: DC

## 2021-10-22 MED ORDER — LYBALVI 20-10 MG PO TABS
1.0000 | ORAL_TABLET | Freq: Every day | ORAL | 0 refills | Status: DC
Start: 2021-10-22 — End: 2021-11-16

## 2021-10-22 MED ORDER — BENZTROPINE MESYLATE 1 MG PO TABS: 1.0000 mg | ORAL_TABLET | Freq: Two times a day (BID) | ORAL | 0 refills | Status: DC

## 2021-10-22 MED ORDER — NICOTINE 7 MG/24HR TD PT24: 7.0000 mg | MEDICATED_PATCH | Freq: Every day | TRANSDERMAL | 0 refills | Status: AC

## 2021-10-22 MED ORDER — INGREZZA 40 MG PO CAPS: 40.0000 mg | ORAL_CAPSULE | Freq: Every morning | ORAL | 0 refills | Status: DC

## 2021-10-22 NOTE — ED Notes (Signed)
Patient A&Ox4. Patient is calm and cooperative at this time.Denies intent to harm self/others when asked. Denies A/VH. Patient denies any physical complaints when asked. No acute distress noted. Support and encouragement provided. Routine safety checks conducted according to facility protocol. Encouraged patient to notify staff if thoughts of harm toward self or others arise. Patient verbalize understanding and agreement. Will continue to monitor for safety.

## 2021-10-22 NOTE — ED Notes (Signed)
Patient A&O x 4, ambulatory. Patient discharged in no acute distress. Patient denied SI/HI, A/VH upon discharge. Patient verbalized understanding of all discharge instructions explained by staff also to group home staff. AVS and Lab requisition handed to  group home staff. include follow up appointments,  and safety plan. Patient reported mood 10/10.  Pt belongings returned to patient from locker # 30  intact. Patient escorted to lobby via staff for transport to destination. Safety maintained.

## 2021-10-22 NOTE — ED Notes (Signed)
Patient giving a cup of coffee. Patient voices no complaints or concerns at this time. Respirations equal and unlabored, skin warm and dry, NAD. No change in assessment or acuity. Routine safety checks conducted according to facility protocol. Will continue to monitor for safety.   

## 2021-10-22 NOTE — Progress Notes (Signed)
Pharmacy:  Clozapine  Pt added to Clozapine registry  Clozapine REMS ID [id number] ANC ordered for weekly   [facility]ems RDA is [id number]  Weekly ANC   Peggye Fothergill, Pharm D

## 2021-10-22 NOTE — ED Notes (Signed)
Pt was given cereal and milk for breakfast.  

## 2021-10-22 NOTE — ED Notes (Signed)
Patient resting quietly in bed watching TV waiting on her ride.  Respirations equal and unlabored, skin warm and dry, NAD. No change in assessment or acuity. Routine safety checks conducted according to facility protocol. Will continue to monitor for safety.

## 2021-10-22 NOTE — ED Provider Notes (Signed)
FBC/OBS ASAP Discharge Summary  Date and Time: 10/22/2021 6:20 PM  Name: Teresa Murillo  MRN:  [id number]   Discharge Diagnoses:  Final diagnoses:  Schizophrenia, unspecified type [facility])    Subjective:  Teresa Murillo is a 61 year old female with a past psychiatric history of schizophrenia, aggressive behavior, and possible intellectual disability.  She presented to the [facility] behavioral health urgent care after an argument with staff members at her group home that resulted in her signing discharge paperwork and leaving.  Per previous documentation, the group home staff members have reported that the patient has a history of intellectual disability.  Cannot find this documented elsewhere in the chart.  It is certainly possible, as the patient is unable to state any of her home medications and is unable to perform simple math problems.  The patient reports that she receives a disability check each month. The patient is connected with [facility] but does not receive ACT services.  The patient has a care coordinator with [facility] named Ms. Green, with whom the LCSW has frequently been communicating with to determine proper disposition for the patient.   On assessment this morning, the patient is calm and cooperative.  Per nursing documentation the patient has also been calm and cooperative.  Presently she does not appear to be psychotic and she does not admit to any auditory or visual hallucinations.  She denies suicidal or homicidal thoughts.  She reports appropriate mood, sleep, and appetite.   Stay Summary: During her stay in the observation unit, the patient exhibited good behavioral and emotional regulation.  She was discharged back into the care of of Ms. [name] and her AFL.  Total Time spent with patient: 15 minutes  Past Psychiatric History: as above Past Medical History:  Past Medical History:  Diagnosis Date   Chronic obstructive pulmonary disease (COPD) (HCC)     Glaucoma    Hyperlipidemia    Hypertension    Iron deficiency    Schizoaffective disorder (HCC)    Type 2 diabetes mellitus (HCC)     Past Surgical History:  Procedure Laterality Date   TUBAL LIGATION     Family History:  Family History  Problem Relation Age of Onset   Breast cancer Maternal Grandmother    Family Psychiatric History: per H and P Social History:  Social History   Substance and Sexual Activity  Alcohol Use Yes     Social History   Substance and Sexual Activity  Drug Use Not Currently    Social History   Socioeconomic History   Marital status: Divorced    Spouse name: Not on file   Number of children: Not on file   Years of education: Not on file   Highest education level: Not on file  Occupational History   Not on file  Tobacco Use   Smoking status: Every Day    Packs/day: 1.00    Types: Cigars, Cigarettes   Smokeless tobacco: Current  Vaping Use   Vaping Use: Never used  Substance and Sexual Activity   Alcohol use: Yes   Drug use: Not Currently   Sexual activity: Not Currently    Birth control/protection: Surgical  Other Topics Concern   Not on file  Social History Narrative   Not on file   Social Determinants of Health   Financial Resource Strain: Not on file  Food Insecurity: Not on file  Transportation Needs: Not on file  Physical Activity: Not on file  Stress: Not on file  Social Connections: Not on file   SDOH:  SDOH Screenings   Alcohol Screen: Not on file  Depression (PHQ2-9): Not on file  Financial Resource Strain: Not on file  Food Insecurity: Not on file  Housing: Not on file  Physical Activity: Not on file  Social Connections: Not on file  Stress: Not on file  Tobacco Use: High Risk (09/09/2021)   Patient History    Smoking Tobacco Use: Every Day    Smokeless Tobacco Use: Current    Passive Exposure: Not on file  Transportation Needs: Not on file    Tobacco Cessation:  N/A, patient does not currently use  tobacco products  Current Medications:  Current Facility-Administered Medications  Medication Dose Route Frequency Provider Last Rate Last Admin   acetaminophen (TYLENOL) tablet 650 mg  650 mg Oral Q6H PRN Lauree Chandler, NP   650 mg at 10/21/21 2357   alum & mag hydroxide-simeth (MAALOX/MYLANTA) 200-200-20 MG/5ML suspension 30 mL  30 mL Oral Q4H PRN Lauree Chandler, NP       benztropine (COGENTIN) tablet 1 mg  1 mg Oral BID Lauree Chandler, NP   1 mg at 10/22/21 0929   cloZAPine (CLOZARIL) tablet 50 mg  50 mg Oral BID Lauree Chandler, NP   50 mg at 10/22/21 7829   divalproex (DEPAKOTE ER) 24 hr tablet 500 mg  500 mg Oral BID Lauree Chandler, NP   500 mg at 10/22/21 5621   hydrOXYzine (ATARAX) tablet 50 mg  50 mg Oral TID PRN Carlyn Reichert, MD   50 mg at 10/21/21 2205   latanoprost (XALATAN) 0.005 % ophthalmic solution 1 drop  1 drop Both Eyes QHS Lauree Chandler, NP   1 drop at 10/21/21 2207   lisinopril (ZESTRIL) tablet 10 mg  10 mg Oral Daily Lauree Chandler, NP   10 mg at 10/22/21 3086   magnesium hydroxide (MILK OF MAGNESIA) suspension 30 mL  30 mL Oral Daily PRN Lauree Chandler, NP       metFORMIN (GLUCOPHAGE) tablet 500 mg  500 mg Oral BID WC Lauree Chandler, NP   500 mg at 10/22/21 0751   mometasone-formoterol (DULERA) 200-5 MCG/ACT inhaler 2 puff  2 puff Inhalation BID Lauree Chandler, NP   2 puff at 10/22/21 0751   nicotine (NICODERM CQ - dosed in mg/24 hr) patch 7 mg  7 mg Transdermal Daily Lauree Chandler, NP   7 mg at 10/22/21 0931   OLANZapine (ZYPREXA) tablet 20 mg  20 mg Oral QHS Carlyn Reichert, MD   20 mg at 10/21/21 2204   valbenazine (INGREZZA) capsule 40 mg  40 mg Oral q AM Lauree Chandler, NP   40 mg at 10/22/21 5784   Current Outpatient Medications  Medication Sig Dispense Refill   budesonide-formoterol (SYMBICORT) 160-4.5 MCG/ACT inhaler Inhale 2 puffs into the lungs in the morning and at bedtime.     Cholecalciferol  (VITAMIN D3) 1.25 MG (50000 UT) CAPS Take 50,000 Units by mouth every Thursday.     clozapine (CLOZARIL) 50 MG tablet Take 50 mg by mouth 2 (two) times daily.     latanoprost (XALATAN) 0.005 % ophthalmic solution Place 1 drop into both eyes at bedtime.     lisinopril (ZESTRIL) 10 MG tablet Take 10 mg by mouth daily.     metFORMIN (GLUCOPHAGE) 500 MG tablet Take 500 mg by mouth 2 (two) times daily with a meal.     PROAIR HFA  108 (90 Base) MCG/ACT inhaler Inhale 2 puffs into the lungs every 6 (six) hours as needed for wheezing or shortness of breath.     benztropine (COGENTIN) 1 MG tablet Take 1 tablet (1 mg total) by mouth in the morning and at bedtime. 60 tablet 0   divalproex (DEPAKOTE ER) 500 MG 24 hr tablet Take 1 tablet (500 mg total) by mouth in the morning and at bedtime. 60 tablet 0   INGREZZA 40 MG capsule Take 1 capsule (40 mg total) by mouth in the morning. 30 capsule 0   nicotine (NICODERM CQ - DOSED IN MG/24 HR) 7 mg/24hr patch Place 1 patch (7 mg total) onto the skin daily. 28 patch 0   OLANZapine-Samidorphan (LYBALVI) 20-10 MG TABS Take 1 tablet by mouth at bedtime. 30 tablet 0    PTA Medications: (Not in a hospital admission)       No data to display          Flowsheet Row ED from 10/04/2021 in [facility] ED from 09/09/2021 in [facility] EMERGENCY DEPART[facility] ED from 08/22/2021 in [facility]  C-SSRS RISK CATEGORY No Risk No Risk No Risk       Musculoskeletal  Strength & Muscle Tone: within normal limits Gait & Station: normal Patient leans: N/A  Psychiatric Specialty Exam  Presentation  General Appearance: Appropriate for Environment  Eye Contact:Good  Speech:Clear and Coherent  Speech Volume:Normal  Handedness:Right   Mood and Affect  Mood:Euthymic  Affect:Constricted   Thought Process  Thought Processes:Coherent; Linear  Descriptions of  Associations:Intact  Orientation:Full (Time, Place and Person)  Thought Content:Logical  Diagnosis of Schizophrenia or Schizoaffective disorder in past: Yes  Duration of Psychotic Symptoms: No data recorded  Hallucinations: none Ideas of Reference:None  Suicidal Thoughts: denies Homicidal Thoughts: denies  Sensorium  Memory:Immediate Fair; Recent Fair; Remote Fair  Judgment:Poor  Insight:Poor   Executive Functions  Concentration:Poor  Attention Span:Poor  Recall:Poor  Fund of Knowledge:Poor  Language:Poor   Psychomotor Activity  Psychomotor Activity: normal Assets  Assets:Communication Skills; Desire for Improvement   Sleep  Sleep: good   Physical Exam  Physical Exam Constitutional:      Appearance: the patient is not toxic-appearing.  Pulmonary:     Effort: Pulmonary effort is normal.  Neurological:     General: No focal deficit present.     Mental Status: the patient is alert and oriented to person, place, and time.   Review of Systems  Respiratory:  Negative for shortness of breath.   Cardiovascular:  Negative for chest pain.  Gastrointestinal:  Negative for abdominal pain, constipation, diarrhea, nausea and vomiting.  Neurological:  Negative for headaches.   Blood pressure 108/75, pulse 72, temperature 98.7 F (37.1 C), temperature source Oral, resp. rate 18, SpO2 96 %. There is no height or weight on file to calculate BMI.  Demographic Factors:  Low socioeconomic status  Loss Factors: NA  Historical Factors: NA  Risk Reduction Factors:   Living with another person, especially a relative and Positive social support  Continued Clinical Symptoms:  Schizophrenia, IDD  Cognitive Features That Contribute To Risk:  None    Suicide Risk:  Mild: Patient presented with no identifiable suicidal ideation.  She has a past psychiatric history of schizophrenia but no noted history of self-harm or suicide attempts.  While admitted to the  observation unit, the patient has demonstrated good behavioral and emotional regulation.  She is being discharged into the care  of an [facility].  Plan Of Care/Follow-up recommendations:  Activity: as tolerated  Diet: heart healthy  Other: -Follow-up with your outpatient psychiatric provider - information provided in the discharge instructions.   -Take your psychiatric medications as prescribed at discharge - instructions are provided to you in the discharge paperwork.  -Follow-up with outpatient primary care doctor and other specialists -for management of preventative medicine and chronic medical disease.   -Recommend abstinence from alcohol, tobacco, and other illicit drug use at discharge.   -If your psychiatric symptoms recur, worsen, or if you have side effects to your psychiatric medications, call your outpatient psychiatric provider, 911, [phone] or go to the nearest emergency department.   Disposition: Self-care, to [facility]  Carlyn Reichert, MD 10/22/2021, 6:20 PM

## 2021-11-08 ENCOUNTER — Encounter (HOSPITAL_COMMUNITY): Payer: Self-pay

## 2021-11-08 ENCOUNTER — Emergency Department (HOSPITAL_COMMUNITY): Payer: Medicaid Other

## 2021-11-08 ENCOUNTER — Inpatient Hospital Stay (HOSPITAL_COMMUNITY)
Admission: EM | Admit: 2021-11-08 | Discharge: 2021-11-16 | DRG: 308 | Disposition: A | Discharge status: Home / Self Care | Payer: Medicaid Other | Attending: Internal Medicine | Admitting: Internal Medicine

## 2021-11-08 ENCOUNTER — Other Ambulatory Visit: Payer: Self-pay

## 2021-11-08 DIAGNOSIS — R4182 Altered mental status, unspecified: Secondary | ICD-10-CM

## 2021-11-08 DIAGNOSIS — N179 Acute kidney failure, unspecified: Secondary | ICD-10-CM | POA: Diagnosis present

## 2021-11-08 DIAGNOSIS — F209 Schizophrenia, unspecified: Secondary | ICD-10-CM

## 2021-11-08 DIAGNOSIS — G9341 Metabolic encephalopathy: Secondary | ICD-10-CM | POA: Diagnosis present

## 2021-11-08 DIAGNOSIS — F259 Schizoaffective disorder, unspecified: Secondary | ICD-10-CM | POA: Diagnosis present

## 2021-11-08 DIAGNOSIS — K117 Disturbances of salivary secretion: Secondary | ICD-10-CM | POA: Diagnosis present

## 2021-11-08 DIAGNOSIS — I080 Rheumatic disorders of both mitral and aortic valves: Secondary | ICD-10-CM | POA: Diagnosis present

## 2021-11-08 DIAGNOSIS — Z79899 Other long term (current) drug therapy: Secondary | ICD-10-CM

## 2021-11-08 DIAGNOSIS — Z880 Allergy status to penicillin: Secondary | ICD-10-CM

## 2021-11-08 DIAGNOSIS — E785 Hyperlipidemia, unspecified: Secondary | ICD-10-CM

## 2021-11-08 DIAGNOSIS — I10 Essential (primary) hypertension: Secondary | ICD-10-CM | POA: Diagnosis not present

## 2021-11-08 DIAGNOSIS — R109 Unspecified abdominal pain: Secondary | ICD-10-CM

## 2021-11-08 DIAGNOSIS — I3139 Other pericardial effusion (noninflammatory): Secondary | ICD-10-CM | POA: Diagnosis present

## 2021-11-08 DIAGNOSIS — I4892 Unspecified atrial flutter: Secondary | ICD-10-CM | POA: Diagnosis present

## 2021-11-08 DIAGNOSIS — Z7901 Long term (current) use of anticoagulants: Secondary | ICD-10-CM

## 2021-11-08 DIAGNOSIS — R400 Somnolence: Principal | ICD-10-CM

## 2021-11-08 DIAGNOSIS — E039 Hypothyroidism, unspecified: Secondary | ICD-10-CM | POA: Diagnosis present

## 2021-11-08 DIAGNOSIS — F17210 Nicotine dependence, cigarettes, uncomplicated: Secondary | ICD-10-CM | POA: Diagnosis present

## 2021-11-08 DIAGNOSIS — Z20822 Contact with and (suspected) exposure to covid-19: Secondary | ICD-10-CM | POA: Diagnosis present

## 2021-11-08 DIAGNOSIS — G9340 Encephalopathy, unspecified: Secondary | ICD-10-CM

## 2021-11-08 DIAGNOSIS — F17290 Nicotine dependence, other tobacco product, uncomplicated: Secondary | ICD-10-CM | POA: Diagnosis present

## 2021-11-08 DIAGNOSIS — Z7984 Long term (current) use of oral hypoglycemic drugs: Secondary | ICD-10-CM

## 2021-11-08 DIAGNOSIS — E860 Dehydration: Secondary | ICD-10-CM | POA: Diagnosis present

## 2021-11-08 DIAGNOSIS — H409 Unspecified glaucoma: Secondary | ICD-10-CM | POA: Diagnosis present

## 2021-11-08 DIAGNOSIS — J449 Chronic obstructive pulmonary disease, unspecified: Secondary | ICD-10-CM | POA: Diagnosis present

## 2021-11-08 DIAGNOSIS — E7220 Disorder of urea cycle metabolism, unspecified: Secondary | ICD-10-CM

## 2021-11-08 DIAGNOSIS — I4891 Unspecified atrial fibrillation: Principal | ICD-10-CM

## 2021-11-08 DIAGNOSIS — J9601 Acute respiratory failure with hypoxia: Secondary | ICD-10-CM

## 2021-11-08 DIAGNOSIS — E119 Type 2 diabetes mellitus without complications: Secondary | ICD-10-CM | POA: Diagnosis present

## 2021-11-08 DIAGNOSIS — Z7951 Long term (current) use of inhaled steroids: Secondary | ICD-10-CM

## 2021-11-08 LAB — I-STAT VENOUS BLOOD GAS, ED
Acid-base deficit: 2 mmol/L (ref 0.0–2.0)
Bicarbonate: 23.1 mmol/L (ref 20.0–28.0)
Calcium, Ion: 1.1 mmol/L — ABNORMAL LOW (ref 1.15–1.40)
HCT: 36 % (ref 36.0–46.0)
Hemoglobin: 12.2 g/dL (ref 12.0–15.0)
O2 Saturation: 100 %
Potassium: 3.9 mmol/L (ref 3.5–5.1)
Sodium: 135 mmol/L (ref 135–145)
TCO2: 24 mmol/L (ref 22–32)
pCO2, Ven: 38.5 mmHg — ABNORMAL LOW (ref 44–60)
pH, Ven: 7.387 (ref 7.25–7.43)
pO2, Ven: 168 mmHg — ABNORMAL HIGH (ref 32–45)

## 2021-11-08 LAB — CBC WITH DIFFERENTIAL/PLATELET
Abs Immature Granulocytes: 0.02 10*3/uL (ref 0.00–0.07)
Basophils Absolute: 0 10*3/uL (ref 0.0–0.1)
Basophils Relative: 1 %
Eosinophils Absolute: 0.1 10*3/uL (ref 0.0–0.5)
Eosinophils Relative: 1 %
HCT: 38.2 % (ref 36.0–46.0)
Hemoglobin: 12.3 g/dL (ref 12.0–15.0)
Immature Granulocytes: 0 %
Lymphocytes Relative: 16 %
Lymphs Abs: 1.2 10*3/uL (ref 0.7–4.0)
MCH: 26 pg (ref 26.0–34.0)
MCHC: 32.2 g/dL (ref 30.0–36.0)
MCV: 80.8 fL (ref 80.0–100.0)
Monocytes Absolute: 0.7 10*3/uL (ref 0.1–1.0)
Monocytes Relative: 9 %
Neutro Abs: 5.6 10*3/uL (ref 1.7–7.7)
Neutrophils Relative %: 73 %
Platelets: 286 10*3/uL (ref 150–400)
RBC: 4.73 MIL/uL (ref 3.87–5.11)
RDW: 16.7 % — ABNORMAL HIGH (ref 11.5–15.5)
WBC: 7.7 10*3/uL (ref 4.0–10.5)
nRBC: 0 % (ref 0.0–0.2)

## 2021-11-08 LAB — MAGNESIUM: Magnesium: 1.7 mg/dL (ref 1.7–2.4)

## 2021-11-08 LAB — RAPID URINE DRUG SCREEN, HOSP PERFORMED
Amphetamines: NOT DETECTED
Barbiturates: NOT DETECTED
Benzodiazepines: NOT DETECTED
Cocaine: NOT DETECTED
Opiates: NOT DETECTED
Tetrahydrocannabinol: NOT DETECTED

## 2021-11-08 LAB — COMPREHENSIVE METABOLIC PANEL WITH GFR
ALT: 15 U/L (ref 0–44)
AST: 20 U/L (ref 15–41)
Albumin: 3.7 g/dL (ref 3.5–5.0)
Alkaline Phosphatase: 65 U/L (ref 38–126)
Anion gap: 11 (ref 5–15)
BUN: 16 mg/dL (ref 6–20)
CO2: 23 mmol/L (ref 22–32)
Calcium: 9.3 mg/dL (ref 8.9–10.3)
Chloride: 104 mmol/L (ref 98–111)
Creatinine, Ser: 1.17 mg/dL — ABNORMAL HIGH (ref 0.44–1.00)
GFR, Estimated: 54 mL/min — ABNORMAL LOW
Glucose, Bld: 129 mg/dL — ABNORMAL HIGH (ref 70–99)
Potassium: 4.1 mmol/L (ref 3.5–5.1)
Sodium: 138 mmol/L (ref 135–145)
Total Bilirubin: 0.5 mg/dL (ref 0.3–1.2)
Total Protein: 7.5 g/dL (ref 6.5–8.1)

## 2021-11-08 LAB — TROPONIN I (HIGH SENSITIVITY)
Troponin I (High Sensitivity): 7 ng/L (ref <18)
Troponin I (High Sensitivity): 8 ng/L (ref <18)

## 2021-11-08 LAB — SALICYLATE LEVEL: Salicylate Lvl: <7 mg/dL — ABNORMAL LOW (ref 7.0–30.0)

## 2021-11-08 LAB — ETHANOL: Alcohol, Ethyl (B): <10 mg/dL (ref <10)

## 2021-11-08 LAB — URINALYSIS, ROUTINE W REFLEX MICROSCOPIC
Bilirubin Urine: NEGATIVE
Glucose, UA: NEGATIVE mg/dL
Hgb urine dipstick: NEGATIVE
Ketones, ur: NEGATIVE mg/dL
Leukocytes,Ua: NEGATIVE
Nitrite: NEGATIVE
Protein, ur: NEGATIVE mg/dL
Specific Gravity, Urine: 1.026 (ref 1.005–1.030)
pH: 6 (ref 5.0–8.0)

## 2021-11-08 LAB — AMMONIA: Ammonia: 49 umol/L — ABNORMAL HIGH (ref 9–35)

## 2021-11-08 LAB — RESP PANEL BY RT-PCR (FLU A&B, COVID) ARPGX2
Influenza A by PCR: NEGATIVE
Influenza B by PCR: NEGATIVE
SARS Coronavirus 2 by RT PCR: NEGATIVE

## 2021-11-08 LAB — CBG MONITORING, ED: Glucose-Capillary: 218 mg/dL — ABNORMAL HIGH (ref 70–99)

## 2021-11-08 LAB — I-STAT BETA HCG BLOOD, ED (MC, WL, AP ONLY): I-stat hCG, quantitative: <5 m[IU]/mL (ref <5)

## 2021-11-08 LAB — TSH: TSH: 1.293 u[IU]/mL (ref 0.350–4.500)

## 2021-11-08 LAB — ACETAMINOPHEN LEVEL: Acetaminophen (Tylenol), Serum: <10 ug/mL — ABNORMAL LOW (ref 10–30)

## 2021-11-08 LAB — BRAIN NATRIURETIC PEPTIDE: B Natriuretic Peptide: 21.1 pg/mL (ref 0.0–100.0)

## 2021-11-08 MED ORDER — MOMETASONE FURO-FORMOTEROL FUM 200-5 MCG/ACT IN AERO
2.0000 | INHALATION_SPRAY | Freq: Two times a day (BID) | RESPIRATORY_TRACT | Status: DC
  Administered 2021-11-09 – 2021-11-16 (×15): 2 via RESPIRATORY_TRACT
  Filled 2021-11-08: qty 8.8

## 2021-11-08 MED ORDER — APIXABAN 5 MG PO TABS
5.0000 mg | ORAL_TABLET | Freq: Two times a day (BID) | ORAL | Status: DC
  Administered 2021-11-08 – 2021-11-16 (×16): 5 mg via ORAL
  Filled 2021-11-08 (×16): qty 1

## 2021-11-08 MED ORDER — INSULIN ASPART 100 UNIT/ML IJ SOLN
0.0000 [IU] | Freq: Three times a day (TID) | INTRAMUSCULAR | Status: DC
  Administered 2021-11-09: 3 [IU] via SUBCUTANEOUS
  Administered 2021-11-10: 2 [IU] via SUBCUTANEOUS
  Administered 2021-11-11 (×2): 1 [IU] via SUBCUTANEOUS
  Administered 2021-11-12: 3 [IU] via SUBCUTANEOUS
  Administered 2021-11-12: 2 [IU] via SUBCUTANEOUS
  Administered 2021-11-13: 3 [IU] via SUBCUTANEOUS
  Administered 2021-11-13: 1 [IU] via SUBCUTANEOUS
  Administered 2021-11-14 – 2021-11-15 (×4): 3 [IU] via SUBCUTANEOUS
  Administered 2021-11-15: 2 [IU] via SUBCUTANEOUS
  Administered 2021-11-15 – 2021-11-16 (×2): 3 [IU] via SUBCUTANEOUS
  Administered 2021-11-16: 2 [IU] via SUBCUTANEOUS

## 2021-11-08 MED ORDER — ACETAMINOPHEN 650 MG RE SUPP: 650.0000 mg | Freq: Four times a day (QID) | RECTAL | Status: DC | PRN

## 2021-11-08 MED ORDER — IOHEXOL 300 MG/ML  SOLN
100.0000 mL | Freq: Once | INTRAMUSCULAR | Status: AC | PRN
Start: 2021-11-08 — End: 2021-11-08
  Administered 2021-11-08: 100 mL via INTRAVENOUS

## 2021-11-08 MED ORDER — POLYETHYLENE GLYCOL 3350 17 G PO PACK: 17.0000 g | PACK | Freq: Every day | ORAL | Status: DC | PRN

## 2021-11-08 MED ORDER — SODIUM CHLORIDE 0.9% FLUSH
3.0000 mL | Freq: Two times a day (BID) | INTRAVENOUS | Status: DC
  Administered 2021-11-09 – 2021-11-16 (×13): 3 mL via INTRAVENOUS

## 2021-11-08 MED ORDER — SODIUM CHLORIDE 0.9 % IV SOLN: INTRAVENOUS | Status: DC

## 2021-11-08 MED ORDER — ACETAMINOPHEN 325 MG PO TABS
650.0000 mg | ORAL_TABLET | Freq: Four times a day (QID) | ORAL | Status: DC | PRN
  Administered 2021-11-09 – 2021-11-15 (×3): 650 mg via ORAL
  Filled 2021-11-08 (×3): qty 2

## 2021-11-08 MED ORDER — ALBUTEROL SULFATE (2.5 MG/3ML) 0.083% IN NEBU: 3.0000 mL | INHALATION_SOLUTION | Freq: Four times a day (QID) | RESPIRATORY_TRACT | Status: DC | PRN

## 2021-11-08 MED ORDER — SODIUM CHLORIDE 0.9 % IV BOLUS
1000.0000 mL | Freq: Once | INTRAVENOUS | Status: AC
  Administered 2021-11-08: 1000 mL via INTRAVENOUS

## 2021-11-08 MED ORDER — LATANOPROST 0.005 % OP SOLN
1.0000 [drp] | Freq: Every day | OPHTHALMIC | Status: DC
Start: 2021-11-08 — End: 2021-11-16
  Administered 2021-11-09 – 2021-11-15 (×8): 1 [drp] via OPHTHALMIC
  Filled 2021-11-08: qty 2.5

## 2021-11-08 MED ORDER — DILTIAZEM HCL-DEXTROSE 125-5 MG/125ML-% IV SOLN (PREMIX)
5.0000 mg/h | INTRAVENOUS | Status: AC
  Administered 2021-11-08: 5 mg/h via INTRAVENOUS
  Filled 2021-11-08: qty 125

## 2021-11-08 NOTE — ED Notes (Signed)
Pt SpO2 decreased to 88% on RA, placed pt on 2L O2, SpO2 increased to 96%

## 2021-11-08 NOTE — ED Triage Notes (Signed)
Patient resides in a "rest home" and reports that they told her she had to come because she isnt doing right. States that they told her she is hallucinating. States that she takes her daily meds as prescribed. Patient denies SI/HI. Alert and oriented

## 2021-11-08 NOTE — ED Notes (Signed)
ED provider at bedside at this time 

## 2021-11-08 NOTE — ED Notes (Signed)
RN transported pt to CT 

## 2021-11-08 NOTE — Progress Notes (Addendum)
RT attempted x2 to obtain ABG, unsuccessful. MD notified. MD gave verbal order to RT to change ABG order to VBG.

## 2021-11-08 NOTE — ED Notes (Signed)
Entered the room to find pt had removed her monitor leads, pulse ox, and bp cuff, and purewick. Pt is stating that she has to go to the restroom. Pt educated about the importance of the monitor cords to be on and not to remove, pt also educated on the importance of the purewick staying in place and that she is not able to ambulate. Monitor cords and purewick put back on

## 2021-11-08 NOTE — ED Notes (Signed)
Received verbal report from Mykenzie C RN at this time 

## 2021-11-08 NOTE — ED Notes (Signed)
Pt was called x3 no answer 

## 2021-11-08 NOTE — ED Notes (Signed)
Pt reports she hasn't been feeling well for weeks and months, states she hurts everywhere. Pt lethargic upon exam, pt falling asleep while attempting to take temp and while answering questions.

## 2021-11-08 NOTE — ED Provider Notes (Signed)
[facility] EMERGENCY DEPARTMENT Provider Note   CSN: [id number] Arrival date & time: 11/08/21  [id number]     History  Chief Complaint  Patient presents with   Medical Clearance    Teresa Murillo is a 61 y.o. female.  The history is provided by the patient and medical records. No language interpreter was used.  Altered Mental Status Presenting symptoms: confusion and partial responsiveness   Severity:  Severe Episode history:  Continuous Timing:  Constant Progression:  Unchanged Chronicity:  New Associated symptoms: abdominal pain, hallucinations (possible per facility report) and nausea   Associated symptoms: no fever, no headaches, no light-headedness, no rash, no seizures, no slurred speech, no vomiting and no weakness        Home Medications Prior to Admission medications   Medication Sig Start Date End Date Taking? Authorizing Provider  benztropine (COGENTIN) 1 MG tablet Take 1 tablet (1 mg total) by mouth in the morning and at bedtime. 10/22/21   Corky Sox, MD  budesonide-formoterol [facility]) 160-4.5 MCG/ACT inhaler Inhale 2 puffs into the lungs in the morning and at bedtime. 01/08/21   [provider]  Cholecalciferol (VITAMIN D3) 1.25 MG (50000 UT) CAPS Take 50,000 Units by mouth every Thursday.    [provider]  clozapine (CLOZARIL) 50 MG tablet Take 50 mg by mouth 2 (two) times daily.    [provider]  divalproex (DEPAKOTE ER) 500 MG 24 hr tablet Take 1 tablet (500 mg total) by mouth in the morning and at bedtime. 10/22/21   Corky Sox, MD  [facility] 40 MG capsule Take 1 capsule (40 mg total) by mouth in the morning. 10/22/21   Corky Sox, MD  latanoprost (XALATAN) 0.005 % ophthalmic solution Place 1 drop into both eyes at bedtime.    [provider]  lisinopril (ZESTRIL) 10 MG tablet Take 10 mg by mouth daily.    [provider]  metFORMIN (GLUCOPHAGE) 500 MG tablet Take 500 mg by mouth 2  (two) times daily with a meal.    [provider]  nicotine (NICODERM CQ - DOSED IN MG/24 HR) 7 mg/24hr patch Place 1 patch (7 mg total) onto the skin daily. 10/22/21   Corky Sox, MD  OLANZapine-Samidorphan (LYBALVI) 20-10 MG TABS Take 1 tablet by mouth at bedtime. 10/22/21   Corky Sox, MD  PROAIR HFA 108 [phone] Base) MCG/ACT inhaler Inhale 2 puffs into the lungs every 6 (six) hours as needed for wheezing or shortness of breath.    [provider]      Allergies    Penicillins    Review of Systems   Review of Systems  Constitutional:  Positive for fatigue. Negative for chills and fever.  HENT:  Negative for congestion.   Respiratory:  Positive for cough. Negative for chest tightness, shortness of breath and wheezing.   Gastrointestinal:  Positive for abdominal pain and nausea. Negative for constipation, diarrhea and vomiting.  Genitourinary:  Negative for dysuria and flank pain.  Musculoskeletal:  Negative for back pain, neck pain and neck stiffness.  Skin:  Negative for rash and wound.  Neurological:  Negative for seizures, weakness, light-headedness and headaches.  Psychiatric/Behavioral:  Positive for confusion and hallucinations (possible per facility report).   All other systems reviewed and are negative.   Physical Exam Updated Vital Signs BP 113/67   Pulse (!) 118   Temp 97.6 F (36.4 C) (Axillary)   Resp 18   SpO2 97%  Physical Exam Vitals and nursing note  reviewed.  Constitutional:      General: She is not in acute distress.    Appearance: She is well-developed. She is ill-appearing. She is not toxic-appearing or diaphoretic.     Comments: Somnolent   HENT:     Head: Normocephalic and atraumatic.     Nose: Nose normal. No congestion or rhinorrhea.     Mouth/Throat:     Mouth: Mucous membranes are dry.     Pharynx: No oropharyngeal exudate or posterior oropharyngeal erythema.  Eyes:     Extraocular Movements: Extraocular movements intact.      Conjunctiva/sclera: Conjunctivae normal.     Pupils: Pupils are equal, round, and reactive to light.  Cardiovascular:     Rate and Rhythm: Normal rate and regular rhythm.     Heart sounds: No murmur heard. Pulmonary:     Effort: Pulmonary effort is normal. No respiratory distress.     Breath sounds: Normal breath sounds. No wheezing, rhonchi or rales.  Chest:     Chest wall: No tenderness.  Abdominal:     General: Abdomen is flat.     Palpations: Abdomen is soft.     Tenderness: There is abdominal tenderness. There is no guarding or rebound.  Musculoskeletal:        General: No swelling or tenderness.     Cervical back: Neck supple. No tenderness.     Right lower leg: No edema.     Left lower leg: No edema.  Skin:    General: Skin is warm and dry.     Capillary Refill: Capillary refill takes less than 2 seconds.     Findings: No erythema or rash.  Neurological:     Sensory: No sensory deficit.     Motor: No weakness.     ED Results / Procedures / Treatments   Labs (all labs ordered are listed, but only abnormal results are displayed) Labs Reviewed  COMPREHENSIVE METABOLIC PANEL - Abnormal; Notable for the following components:      Result Value   Glucose, Bld 129 (*)    Creatinine, Ser 1.17 (*)    GFR, Estimated 54 (*)    All other components within normal limits  CBC WITH DIFFERENTIAL/PLATELET - Abnormal; Notable for the following components:   RDW 16.7 (*)    All other components within normal limits  SALICYLATE LEVEL - Abnormal; Notable for the following components:   Salicylate Lvl <7.0 (*)    All other components within normal limits  ACETAMINOPHEN LEVEL - Abnormal; Notable for the following components:   Acetaminophen (Tylenol), Serum <10 (*)    All other components within normal limits  AMMONIA - Abnormal; Notable for the following components:   Ammonia 49 (*)    All other components within normal limits  I-STAT VENOUS BLOOD GAS, ED - Abnormal; Notable  for the following components:   pCO2, Ven 38.5 (*)    pO2, Ven 168 (*)    Calcium, Ion 1.10 (*)    All other components within normal limits  RESP PANEL BY RT-PCR (FLU A&B, COVID) ARPGX2  URINE CULTURE  ETHANOL  TSH  MAGNESIUM  RAPID URINE DRUG SCREEN, HOSP PERFORMED  URINALYSIS, ROUTINE W REFLEX MICROSCOPIC  BLOOD GAS, VENOUS  I-STAT BETA HCG BLOOD, ED ([facility], [facility], [facility] ONLY)  TROPONIN I (HIGH SENSITIVITY)  TROPONIN I (HIGH SENSITIVITY)    EKG EKG Interpretation  Date/Time:  Sunday November 08 2021 16:30:35 EDT Ventricular Rate:  118 PR Interval:  132 QRS Duration: 78 QT Interval:  298 QTC Calculation: 418 R Axis:   46 Text Interpretation: Sinus tachycardia Atrial premature complex Biatrial enlargement when compared to prior, faster rate. No STEMI Confirmed by Theda Belfast ([id number]) on 11/08/2021 4:39:48 PM  Radiology CT HEAD WO CONTRAST ( )  Result Date: 11/08/2021 CLINICAL DATA:  Hallucinations EXAM: CT HEAD WITHOUT CONTRAST TECHNIQUE: Contiguous axial images were obtained from the base of the skull through the vertex without intravenous contrast. RADIATION DOSE REDUCTION: This exam was performed according to the departmental dose-optimization program which includes automated exposure control, adjustment of the mA and/or kV according to patient size and/or use of iterative reconstruction technique. COMPARISON:  09/09/2021 FINDINGS: Brain: No evidence of acute infarction, hemorrhage, hydrocephalus, extra-axial collection or mass lesion/mass effect. Vascular: Mild intracranial atherosclerosis. Skull: Normal. Negative for fracture or focal lesion. Sinuses/Orbits: The visualized paranasal sinuses are essentially clear. The mastoid air cells are unopacified. Other: None.  Normal head CT. IMPRESSION: Normal head CT. Electronically Signed   By: Charline Bills M.D.   On: 11/08/2021 19:04   CT ABDOMEN PELVIS W CONTRAST  Result Date: 11/08/2021 CLINICAL DATA:  Abdominal pain EXAM: CT  ABDOMEN AND PELVIS WITH CONTRAST TECHNIQUE: Multidetector CT imaging of the abdomen and pelvis was performed using the standard protocol following bolus administration of intravenous contrast. RADIATION DOSE REDUCTION: This exam was performed according to the departmental dose-optimization program which includes automated exposure control, adjustment of the mA and/or kV according to patient size and/or use of iterative reconstruction technique. CONTRAST:  OMNIPAQUE IOHEXOL 300 MG/ML  SOLN COMPARISON:  None Available. FINDINGS: Lower chest: Minimal scarring/atelectasis the lung bases. Trace right pleural fluid. Hepatobiliary: Liver is within normal limits. Gallbladder is decompressed. No intrahepatic or extrahepatic duct dilatation. Pancreas: Within normal limits. Spleen: Within normal limits Adrenals/Urinary Tract: Adrenal glands are within normal limits. Kidneys are within normal limits. Right extrarenal pelvis. No frank hydronephrosis. Bladder is within normal limits. Stomach/Bowel: Stomach is notable for a large hiatal hernia. No evidence of bowel obstruction. Normal appendix (series 3/image 65). No colonic wall thickening or inflammatory changes. Vascular/Lymphatic: No evidence of abdominal aortic aneurysm. Atherosclerotic calcifications of the abdominal aorta and branch vessels. No suspicious abdominopelvic lymphadenopathy. Reproductive: Uterus is within normal limits. Bilateral ovaries are within normal limits. Other: No abdominopelvic ascites. Musculoskeletal: Mild degenerative changes of the lower thoracic spine. IMPRESSION: No CT findings to account for the patient's abdominal pain. Large hiatal hernia. Electronically Signed   By: Charline Bills M.D.   On: 11/08/2021 19:03   DG Chest Portable 1 View  Result Date: 11/08/2021 CLINICAL DATA:  Altered mental status, tachycardia, nausea and vomiting. EXAM: PORTABLE CHEST 1 VIEW COMPARISON:  Chest x-ray dated 06/02/2020 FINDINGS: Grossly stable  cardiomegaly. No evidence of active CHF. No evidence of pneumonia or pulmonary edema. No pleural effusion or pneumothorax is seen. Moderate-sized hiatal hernia. IMPRESSION: 1. No active disease. No evidence of CHF or pneumonia. 2. Stable cardiomegaly. Electronically Signed   By: Bary Richard M.D.   On: 11/08/2021 17:58    Procedures Procedures    Medications Ordered in ED Medications  diltiazem (CARDIZEM) 125 mg in dextrose 5% 125 mL (1 mg/mL) infusion (5 mg/hr Intravenous New Bag/Given 11/08/21 1737)  sodium chloride 0.9 % bolus 1,000 mL (0 mLs Intravenous Stopped 11/08/21 1855)  iohexol (OMNIPAQUE) 300 MG/ML solution 100 mL (100 mLs Intravenous Contrast Given 11/08/21 1847)    ED Course/ Medical Decision Making/ A&P  Medical Decision Making Amount and/or Complexity of Data Reviewed Labs: ordered. Radiology: ordered.  Risk Prescription drug management. Decision regarding hospitalization.    Teresa Murillo is a 61 y.o. female with a past medical history significant for COPD, schizophrenia, and hypertension who presents for altered mental status and possible loose Nations.  Patient has been here for over 6 hours prior to my initial evaluation.  Patient was told that she was hallucinating and altered at her facility and will need to be sent in for evaluation.  Patient says that for the last week or so she has been feeling off and feeling tired.  She is denying any headache or neck pain but is complaining of some abdominal discomfort with nausea and vomiting.  She denies constipation but does report some diarrhea.  Denies any urinary changes.  Denies any alcohol or drug use.  Denies any trauma.  Reports she cannot stay awake.  Denies any headache, neck pain, or neck stiffness.  She is denying any chest pain or shortness of breath patient's reports of medical  On my exam, patient is extremely somnolent.  She is hard to arouse but when she does she is able to follow  commands.  She had intact sensation and strength in extremities and was able to do some finger-nose-finger testing when she could keep her eyes open.  Pupils are symmetric and reactive, they were not pinpoint.  Normal extraocular movements.  Symmetric smile.  Speech was clear when she was awake.  I do not appreciate asterixis on outstretched hand exam.  Abdomen was tender to palpation but I did hear bowel sounds.  Lungs otherwise clear.  Clinically patient does appear extremely altered and somnolent.  It is unclear at this time why.  Patient is tachycardic and initial telemetry shows a sinus tachycardia with some irregularity and PVCs.  There is no documented history of arrhythmia.  We will get MRI given the altered mental status and lack of headache or trauma.  We will get chest x-ray with her cough and the tachycardia and altered mental status to rule out pneumonia but will also get CT abdomen pelvis to look for abdominal pain etiologies and altered mental status.  We will get screening labs.  Given her lack of headache, neck pain, neck stiffness, have low suspicion for meningitis so we will hold on lumbar puncture at this time.  We will get a blood gas given the somnolence and borderline hypoxia.  Anticipate admission after work-up is completed.   5:25 PM Nursing brought an EKG that heart rate was going into the 190s.  I went to assess patient and was able to do a vagal maneuver and it appears to reveal a flutter with RVR.  We will start diltiazem, give some fluids, and get other labs including troponin and a magnesium level along with the other labs ordered initially.  I am still concerned about the abdominal pain she is having and will get the CT abdomen pelvis.  We will still get the chest x-ray and the MRI of the brain however patient's oxygen saturation did drop into the 80s so we will put her on some oxygen.  Unclear if her somnolence may be affected by the arrhythmias and now hypoxia.  We will also  get CT head given the waxing and waning mental status and now new hypoxia and arrhythmias.  We will likely take hours until she is able to get her MRI so we will get head CT first.  Patient will need  admission for further management and work-up is completed.  8:32 PM Patient is still intermittently in A-fib with RVR on the diltiazem drip.  Is occasionally converting to sinus rhythm and then back.  Does now appear to be in sinus rhythm tachycardia.  Patient is now more awake but is perseverating about wanting juice.  Her CT of the abdomen pelvis did not show concerning findings aside from a large hiatal hernia but no evidence of obstruction.  CT head reassuring initially.  Due to the new A-fib and the altered mental status and perseveration, I do still feel she needs MRI to rule out stroke but we will call for admission for the new A-fib, altered mental status, and slightly elevated ammonia.  Medicine team will be called for admission.        Final Clinical Impression(s) / ED Diagnoses Final diagnoses:  Somnolence  Altered mental status, unspecified altered mental status type  Atrial fibrillation with RVR (HCC)  Acute respiratory failure with hypoxia (HCC)  Abdominal pain, unspecified abdominal location  Hyperammonemia (HCC)     Clinical Impression: 1. Somnolence   2. Altered mental status, unspecified altered mental status type   3. Atrial fibrillation with RVR ([place])   4. Acute respiratory failure with hypoxia (HCC)   5. Abdominal pain, unspecified abdominal location   6. Hyperammonemia (HCC)     Disposition: Admit  This note was prepared with assistance of Dragon voice recognition software. Occasional wrong-word or sound-a-like substitutions may have occurred due to the inherent limitations of voice recognition software.     Teresa Murillo, Gwenyth Allegra, MD 11/08/21 2056

## 2021-11-08 NOTE — ED Notes (Signed)
Admit provider at bedside. Pt does have noticeable tremors in bilat arms

## 2021-11-08 NOTE — H&P (Addendum)
History and Physical   Teresa Murillo [id number] DOB: 06-15-1960 DOA: 11/08/2021  PCP: Ileene Hutchinson, PA   Patient coming from: Facility  Chief Complaint: Acute encephalopathy, hallucinations  HPI: Teresa Murillo is a 61 y.o. female with medical history significant of schizophrenia, COPD, hyperlipidemia, hypertension, diabetes, alcohol, hypothyroidism, anemia presenting with encephalopathy and hallucinations. She states that she was at her facility today and was told by staff that she has been altered and was apparently hallucinations and she was being sent to the ED for further evaluation.  She does report difficulty staying awake and feeling not like her normal self.  She also reports some mild abdominal pain. She describes her hallucinations as seeing a lot shapes and things in the dark.  She states this is happened before.  She denies any substance use.  She denies fevers, chills, chest pain, shortness breath, constipation, diarrhea, nausea, vomiting.  ED Course: Vital signs in the ED significant for heart rate ranging as high as the 190s but typically in 100s to 120s.  Lab work-up included CMP with creatinine of 1.17 from baseline of 0.5, glucose 129.  CBC within normal limits.  TSH normal.  Troponin normal.  Rester panel for flu and COVID-negative.  UDS pending.  Urinalysis, urine culture pending.  Ethanol level normal.  Ammonia level mildly elevated in the 40s.  Aspirin and Tylenol level normal.  VBG with normal pH and PCO2 mildly abnormal at 38.5.  Magnesium normal.  Chest x-ray without acute abnormality with stable cardiomegaly, CT head normal.  CT abdomen pelvis normal with hiatal hernia.  MR brain pending.  Patient received diltiazem drip and a liter of fluid in the ED.  Review of Systems: As per HPI otherwise all other systems reviewed and are negative.  Past Medical History:  Diagnosis Date   Chronic obstructive pulmonary disease (COPD) ([place])    Glaucoma    Hyperlipidemia     Hypertension    Iron deficiency    Schizoaffective disorder ([place])    Type 2 diabetes mellitus ([place])     Past Surgical History:  Procedure Laterality Date   TUBAL LIGATION      Social History  reports that she has been smoking cigars and cigarettes. She has been smoking an average of 1 pack per day. She uses smokeless tobacco. She reports current alcohol use. She reports that she does not currently use drugs.  Allergies  Allergen Reactions   Penicillins Other (See Comments)    Reaction not known at group home    Family History  Problem Relation Age of Onset   Breast cancer Maternal Grandmother   Reviewed on admission  Prior to Admission medications   Medication Sig Start Date End Date Taking? Authorizing Provider  benztropine (COGENTIN) 1 MG tablet Take 1 tablet (1 mg total) by mouth in the morning and at bedtime. 10/22/21   Corky Sox, MD  budesonide-formoterol [facility]) 160-4.5 MCG/ACT inhaler Inhale 2 puffs into the lungs in the morning and at bedtime. 01/08/21   [provider]  Cholecalciferol (VITAMIN D3) 1.25 MG (50000 UT) CAPS Take 50,000 Units by mouth every Thursday.    [provider]  clozapine (CLOZARIL) 50 MG tablet Take 50 mg by mouth 2 (two) times daily.    [provider]  divalproex (DEPAKOTE ER) 500 MG 24 hr tablet Take 1 tablet (500 mg total) by mouth in the morning and at bedtime. 10/22/21   Corky Sox, MD  INGREZZA 40 MG capsule Take 1 capsule (40 mg total)  by mouth in the morning. 10/22/21   Carlyn Reichert, MD  latanoprost (XALATAN) 0.005 % ophthalmic solution Place 1 drop into both eyes at bedtime.    [provider]  lisinopril (ZESTRIL) 10 MG tablet Take 10 mg by mouth daily.    [provider]  metFORMIN (GLUCOPHAGE) 500 MG tablet Take 500 mg by mouth 2 (two) times daily with a meal.    [provider]  nicotine (NICODERM CQ - DOSED IN MG/24 HR) 7 mg/24hr patch Place 1 patch (7 mg total) onto  the skin daily. 10/22/21   Carlyn Reichert, MD  OLANZapine-Samidorphan (LYBALVI) 20-10 MG TABS Take 1 tablet by mouth at bedtime. 10/22/21   Carlyn Reichert, MD  PROAIR HFA 108 [phone] Base) MCG/ACT inhaler Inhale 2 puffs into the lungs every 6 (six) hours as needed for wheezing or shortness of breath.    [provider]    Physical Exam: Vitals:   11/08/21 1845 11/08/21 1854 11/08/21 2030 11/08/21 2129  BP:  120/68 107/73   Pulse: (!) 105 (!) 117 (!) 116   Resp: (!) 21 20 (!) 28   Temp:    98.3 F (36.8 C)  TempSrc:    Oral  SpO2: 99% 99% 98%     Physical Exam Constitutional:      General: She is not in acute distress.    Appearance: Normal appearance.  HENT:     Head: Normocephalic and atraumatic.     Mouth/Throat:     Mouth: Mucous membranes are moist.     Pharynx: Oropharynx is clear.  Eyes:     Extraocular Movements: Extraocular movements intact.     Pupils: Pupils are equal, round, and reactive to light.  Cardiovascular:     Rate and Rhythm: Tachycardia present. Rhythm irregular.     Pulses: Normal pulses.     Heart sounds: Normal heart sounds.  Pulmonary:     Effort: Pulmonary effort is normal. No respiratory distress.     Breath sounds: Normal breath sounds.  Abdominal:     General: Bowel sounds are normal. There is no distension.     Palpations: Abdomen is soft.     Tenderness: There is no abdominal tenderness.  Musculoskeletal:        General: No swelling or deformity.  Skin:    General: Skin is warm and dry.  Neurological:     General: No focal deficit present.     Comments: Is alert on my exam appears to be near baseline not having issues with somnolence at the time I interviewed her.    Labs on Admission: I have personally reviewed following labs and imaging studies  CBC: Recent Labs  Lab 11/08/21 1048 11/08/21 1858  WBC 7.7  --   NEUTROABS 5.6  --   HGB 12.3 12.2  HCT 38.2 36.0  MCV 80.8  --   PLT 286  --     Basic Metabolic  Panel: Recent Labs  Lab 11/08/21 1048 11/08/21 1730 11/08/21 1858  NA 138  --  135  K 4.1  --  3.9  CL 104  --   --   CO2 23  --   --   GLUCOSE 129*  --   --   BUN 16  --   --   CREATININE 1.17*  --   --   CALCIUM 9.3  --   --   MG  --  1.7  --     GFR: CrCl cannot be calculated (  Unknown ideal weight.).  Liver Function Tests: Recent Labs  Lab 11/08/21 1048  AST 20  ALT 15  ALKPHOS 65  BILITOT 0.5  PROT 7.5  ALBUMIN 3.7    Urine analysis:    Component Value Date/Time   COLORURINE STRAW (A) 11/08/2021 2111   APPEARANCEUR CLEAR 11/08/2021 2111   LABSPEC 1.026 11/08/2021 2111   PHURINE 6.0 11/08/2021 2111   GLUCOSEU NEGATIVE 11/08/2021 2111   HGBUR NEGATIVE 11/08/2021 2111   [place] NEGATIVE 11/08/2021 2111   [place] NEGATIVE 11/08/2021 2111   PROTEINUR NEGATIVE 11/08/2021 2111   NITRITE NEGATIVE 11/08/2021 2111   LEUKOCYTESUR NEGATIVE 11/08/2021 2111    Radiological Exams on Admission: CT HEAD WO CONTRAST (5MM)  Result Date: 11/08/2021 CLINICAL DATA:  Hallucinations EXAM: CT HEAD WITHOUT CONTRAST TECHNIQUE: Contiguous axial images were obtained from the base of the skull through the vertex without intravenous contrast. RADIATION DOSE REDUCTION: This exam was performed according to the departmental dose-optimization program which includes automated exposure control, adjustment of the mA and/or kV according to patient size and/or use of iterative reconstruction technique. COMPARISON:  09/09/2021 FINDINGS: Brain: No evidence of acute infarction, hemorrhage, hydrocephalus, extra-axial collection or mass lesion/mass effect. Vascular: Mild intracranial atherosclerosis. Skull: Normal. Negative for fracture or focal lesion. Sinuses/Orbits: The visualized paranasal sinuses are essentially clear. The mastoid air cells are unopacified. Other: None.  Normal head CT. IMPRESSION: Normal head CT. Electronically Signed   By: Julian Hy M.D.   On: 11/08/2021 19:04   CT  ABDOMEN PELVIS W CONTRAST  Result Date: 11/08/2021 CLINICAL DATA:  Abdominal pain EXAM: CT ABDOMEN AND PELVIS WITH CONTRAST TECHNIQUE: Multidetector CT imaging of the abdomen and pelvis was performed using the standard protocol following bolus administration of intravenous contrast. RADIATION DOSE REDUCTION: This exam was performed according to the departmental dose-optimization program which includes automated exposure control, adjustment of the mA and/or kV according to patient size and/or use of iterative reconstruction technique. CONTRAST:  137mL OMNIPAQUE IOHEXOL 300 MG/ML  SOLN COMPARISON:  None Available. FINDINGS: Lower chest: Minimal scarring/atelectasis the lung bases. Trace right pleural fluid. Hepatobiliary: Liver is within normal limits. Gallbladder is decompressed. No intrahepatic or extrahepatic duct dilatation. Pancreas: Within normal limits. Spleen: Within normal limits Adrenals/Urinary Tract: Adrenal glands are within normal limits. Kidneys are within normal limits. Right extrarenal pelvis. No frank hydronephrosis. Bladder is within normal limits. Stomach/Bowel: Stomach is notable for a large hiatal hernia. No evidence of bowel obstruction. Normal appendix (series 3/image 65). No colonic wall thickening or inflammatory changes. Vascular/Lymphatic: No evidence of abdominal aortic aneurysm. Atherosclerotic calcifications of the abdominal aorta and branch vessels. No suspicious abdominopelvic lymphadenopathy. Reproductive: Uterus is within normal limits. Bilateral ovaries are within normal limits. Other: No abdominopelvic ascites. Musculoskeletal: Mild degenerative changes of the lower thoracic spine. IMPRESSION: No CT findings to account for the patient's abdominal pain. Large hiatal hernia. Electronically Signed   By: Julian Hy M.D.   On: 11/08/2021 19:03   DG Chest Portable 1 View  Result Date: 11/08/2021 CLINICAL DATA:  Altered mental status, tachycardia, nausea and vomiting. EXAM:  PORTABLE CHEST 1 VIEW COMPARISON:  Chest x-ray dated 06/02/2020 FINDINGS: Grossly stable cardiomegaly. No evidence of active CHF. No evidence of pneumonia or pulmonary edema. No pleural effusion or pneumothorax is seen. Moderate-sized hiatal hernia. IMPRESSION: 1. No active disease. No evidence of CHF or pneumonia. 2. Stable cardiomegaly. Electronically Signed   By: Franki Cabot M.D.   On: 11/08/2021 17:58    EKG: Independently reviewed.  Tachycardia at  118.  Patient repeat ECG with atrial flutter with RVR 135.   Assessment/Plan Principal Problem:   Atrial flutter with rapid ventricular response (HCC) Active Problems:   Schizophrenia (HCC)   COPD (chronic obstructive pulmonary disease) (HCC)   Dyslipidemia   Essential hypertension   Glaucoma   Type 2 diabetes mellitus without complications (HCC)   Acute encephalopathy   AKI (acute kidney injury) ([place])   Atrial flutter with RVR > Patient initially presented with some altered mentation as below.  Found to be in atrial flutter with RVR in the ED.  Rates as high as the 190s but more in the 130s. > Started on diltiazem drip in the ED and is starting to respond.  No history of prior A-fib. > Troponin normal in the ED.  TSH also normal. - Continue with diltiazem drip on progressive unit - We will start Eliquis for anticoagulation - Check BNP  Acute encephalopathy > Patient presenting from facility after issues with acute cephalopathy and hallucinations.  She does remember being told this by her staff.  Also noted to be lethargic she does state that she has trouble staying awake. > Some improvement in the ED and has been perseverating on drinking juice, could be a psych component. > Infectious component ruled out with no white count, no nuchal rigidity.  Normal chest x-ray normal CT abdomen pelvis normal CT head. > Electrolytes are normal, no hypoxia, no hypercarbia.  TSH normal.  Ammonia level mildly elevated. > UDS pending.  Patient reports  some hallucinations that are similar to previous. - Monitor on telemetry as above - Follow-up UDS - Follow-up MRI - Hold centrally acting medications patient is on several days.  AKI > Elevated 1.17 baseline around 0.5.  Unclear etiology.  Presumed prerenal.  Has received a liter of fluids in the ED. - Continue IV fluids and monitor overnight.  Schizophrenia - Holding home centrally acting medications in setting of encephalopathy as above  COPD - Symbicort with formulary Dulera - Continue as needed albuterol  Hypertension - Hold home antihypertensives and as we get these confirmed and patient is on Dilt drip  Glaucoma - Continue home eyedrops  Diabetes - SSI  DVT prophylaxis: Plan start Eliquis Code Status:   Full Family Communication:  None on admission. Disposition Plan:   Patient is from:  Facility  Anticipated DC to:  Same as above  Anticipated DC date:  1 to 2 days  Anticipated DC barriers: None  Consults called:  None  Admission status:  Observation, progressive  Severity of Illness: The appropriate patient status for this patient is OBSERVATION. Observation status is judged to be reasonable and necessary in order to provide the required intensity of service to ensure the patient's safety. The patient's presenting symptoms, physical exam findings, and initial radiographic and laboratory data in the context of their medical condition is felt to place them at decreased risk for further clinical deterioration. Furthermore, it is anticipated that the patient will be medically stable for discharge from the hospital within 2 midnights of admission.    Marcelyn Bruins MD [facility]  How to contact the [facility] Attending or Consulting provider [place] or covering provider during after hours [place], for this patient?   Check the care team in [facility] and look for a) attending/consulting [facility] provider listed and b) the [facility] team listed Log into [email or web address] and use [facility]'s universal password to access. If you do not have the password, please contact the hospital  operator. Locate the [facility] provider you are looking for under [facility] and page to a number that you can be directly reached. If you still have difficulty reaching the provider, please page the [facility] (Director on Call) for the Hospitalists listed on amion for assistance.  11/08/2021, 9:48 PM

## 2021-11-08 NOTE — Progress Notes (Signed)
ANTICOAGULATION CONSULT NOTE - Initial Consult  Pharmacy Consult for Apixaban Indication: atrial fibrillation  Allergies  Allergen Reactions   Penicillins Other (See Comments)    Reaction not known at group home    Patient Measurements:   Vital Signs: Temp: 98.3 F (36.8 C) (09/10 2129) Temp Source: Oral (09/10 2129) BP: 107/73 (09/10 2030) Pulse Rate: 116 (09/10 2030)  Labs: Recent Labs    11/08/21 1048 11/08/21 1730 11/08/21 1858  HGB 12.3  --  12.2  HCT 38.2  --  36.0  PLT 286  --   --   CREATININE 1.17*  --   --   TROPONINIHS  --  7  --     CrCl cannot be calculated (Unknown ideal weight.).   Medical History: Past Medical History:  Diagnosis Date   Chronic obstructive pulmonary disease (COPD) (HCC)    Glaucoma    Hyperlipidemia    Hypertension    Iron deficiency    Schizoaffective disorder (HCC)    Type 2 diabetes mellitus (HCC)    Medications:  (Not in a hospital admission)  Scheduled:   [START ON 11/09/2021] insulin aspart  0-9 Units Subcutaneous TID WC   latanoprost  1 drop Both Eyes QHS   mometasone-formoterol  2 puff Inhalation BID   sodium chloride flush  3 mL Intravenous Q12H   Infusions:   sodium chloride     diltiazem (CARDIZEM) infusion 5 mg/hr (11/08/21 1737)   Assessment: 60 yof schizophrenia, COPD, HLD, HTN, DM, alcohol, hypothyroidism, anemia  presenting with encephalopathy and hallucinations. Found to be in AF RVR into the 190s.  Apixaban per pharmacy consult placed for AF.  Hgb 12.3; plt 286 SCr 1.17  Goal of Therapy:  Monitor platelets by anticoagulation protocol: Yes   Plan:  START eliquis 5mg  BID --Will need education Monitor for s/s hemorrhage  , PharmD, BCPS 11/08/2021 9:34 PM ED Clinical Pharmacist -  [phone]

## 2021-11-08 NOTE — ED Provider Triage Note (Signed)
Emergency Medicine Provider Triage Evaluation Note  Teresa Murillo , a 61 y.o. female  was evaluated in triage.  Pt complains of medical clearance. She states that she was sent here for medical clearance from her facility because she 'wasnt acting right.' She states that they told her she was hallucinating and needed to be checked out to return to her facility. She denies any SI/HI, AVH. No somatic complaints.  Review of Systems  Positive:  Negative:   Physical Exam  There were no vitals taken for this visit. Gen:   Awake, no distress   Resp:  Normal effort  MSK:   Moves extremities without difficulty  Other:  Alert and oriented and neurologically intact without focal deficits. Does not appear to be responding to internal stimuli. Ambulatory.  Medical Decision Making  Medically screening exam initiated at 10:24 AM.  Appropriate orders placed.  Teresa Murillo was informed that the remainder of the evaluation will be completed by another provider, this initial triage assessment does not replace that evaluation, and the importance of remaining in the ED until their evaluation is complete.     Silva Bandy, PA-C 11/08/21 1028

## 2021-11-08 NOTE — ED Notes (Signed)
Pt's HR increased from 110s to 150s, EKG performed. Tegeler MD at  bedside.

## 2021-11-09 ENCOUNTER — Observation Stay (HOSPITAL_COMMUNITY): Payer: Medicaid Other

## 2021-11-09 ENCOUNTER — Ambulatory Visit: Payer: Medicaid Other | Admitting: Internal Medicine

## 2021-11-09 ENCOUNTER — Inpatient Hospital Stay (HOSPITAL_COMMUNITY): Payer: Medicaid Other

## 2021-11-09 DIAGNOSIS — R4182 Altered mental status, unspecified: Secondary | ICD-10-CM | POA: Diagnosis not present

## 2021-11-09 DIAGNOSIS — Z20822 Contact with and (suspected) exposure to covid-19: Secondary | ICD-10-CM | POA: Diagnosis present

## 2021-11-09 DIAGNOSIS — F209 Schizophrenia, unspecified: Secondary | ICD-10-CM | POA: Diagnosis not present

## 2021-11-09 DIAGNOSIS — E785 Hyperlipidemia, unspecified: Secondary | ICD-10-CM | POA: Diagnosis present

## 2021-11-09 DIAGNOSIS — E7220 Disorder of urea cycle metabolism, unspecified: Secondary | ICD-10-CM | POA: Diagnosis present

## 2021-11-09 DIAGNOSIS — R443 Hallucinations, unspecified: Secondary | ICD-10-CM

## 2021-11-09 DIAGNOSIS — F17290 Nicotine dependence, other tobacco product, uncomplicated: Secondary | ICD-10-CM | POA: Diagnosis present

## 2021-11-09 DIAGNOSIS — G9341 Metabolic encephalopathy: Secondary | ICD-10-CM | POA: Diagnosis present

## 2021-11-09 DIAGNOSIS — F17210 Nicotine dependence, cigarettes, uncomplicated: Secondary | ICD-10-CM | POA: Diagnosis present

## 2021-11-09 DIAGNOSIS — E039 Hypothyroidism, unspecified: Secondary | ICD-10-CM | POA: Diagnosis present

## 2021-11-09 DIAGNOSIS — Z7901 Long term (current) use of anticoagulants: Secondary | ICD-10-CM | POA: Diagnosis not present

## 2021-11-09 DIAGNOSIS — R400 Somnolence: Secondary | ICD-10-CM | POA: Diagnosis present

## 2021-11-09 DIAGNOSIS — I080 Rheumatic disorders of both mitral and aortic valves: Secondary | ICD-10-CM | POA: Diagnosis present

## 2021-11-09 DIAGNOSIS — H409 Unspecified glaucoma: Secondary | ICD-10-CM | POA: Diagnosis present

## 2021-11-09 DIAGNOSIS — Z7951 Long term (current) use of inhaled steroids: Secondary | ICD-10-CM | POA: Diagnosis not present

## 2021-11-09 DIAGNOSIS — F259 Schizoaffective disorder, unspecified: Secondary | ICD-10-CM | POA: Diagnosis present

## 2021-11-09 DIAGNOSIS — I4892 Unspecified atrial flutter: Secondary | ICD-10-CM | POA: Diagnosis present

## 2021-11-09 DIAGNOSIS — I10 Essential (primary) hypertension: Secondary | ICD-10-CM | POA: Diagnosis present

## 2021-11-09 DIAGNOSIS — Z7984 Long term (current) use of oral hypoglycemic drugs: Secondary | ICD-10-CM | POA: Diagnosis not present

## 2021-11-09 DIAGNOSIS — N179 Acute kidney failure, unspecified: Secondary | ICD-10-CM | POA: Diagnosis present

## 2021-11-09 DIAGNOSIS — I3139 Other pericardial effusion (noninflammatory): Secondary | ICD-10-CM | POA: Diagnosis present

## 2021-11-09 DIAGNOSIS — J449 Chronic obstructive pulmonary disease, unspecified: Secondary | ICD-10-CM | POA: Diagnosis present

## 2021-11-09 DIAGNOSIS — E860 Dehydration: Secondary | ICD-10-CM | POA: Diagnosis present

## 2021-11-09 DIAGNOSIS — Z79899 Other long term (current) drug therapy: Secondary | ICD-10-CM | POA: Diagnosis not present

## 2021-11-09 DIAGNOSIS — E119 Type 2 diabetes mellitus without complications: Secondary | ICD-10-CM | POA: Diagnosis present

## 2021-11-09 DIAGNOSIS — K117 Disturbances of salivary secretion: Secondary | ICD-10-CM | POA: Diagnosis present

## 2021-11-09 DIAGNOSIS — I4891 Unspecified atrial fibrillation: Secondary | ICD-10-CM | POA: Diagnosis present

## 2021-11-09 DIAGNOSIS — J9601 Acute respiratory failure with hypoxia: Secondary | ICD-10-CM | POA: Diagnosis present

## 2021-11-09 LAB — CBG MONITORING, ED
Glucose-Capillary: 97 mg/dL (ref 70–99)
Glucose-Capillary: 206 mg/dL — ABNORMAL HIGH (ref 70–99)
Glucose-Capillary: 78 mg/dL (ref 70–99)

## 2021-11-09 LAB — CBC
HCT: 34 % — ABNORMAL LOW (ref 36.0–46.0)
Hemoglobin: 11.2 g/dL — ABNORMAL LOW (ref 12.0–15.0)
MCH: 26.4 pg (ref 26.0–34.0)
MCHC: 32.9 g/dL (ref 30.0–36.0)
MCV: 80 fL (ref 80.0–100.0)
Platelets: 255 10*3/uL (ref 150–400)
RBC: 4.25 MIL/uL (ref 3.87–5.11)
RDW: 16.8 % — ABNORMAL HIGH (ref 11.5–15.5)
WBC: 6.9 10*3/uL (ref 4.0–10.5)
nRBC: 0 % (ref 0.0–0.2)

## 2021-11-09 LAB — COMPREHENSIVE METABOLIC PANEL
ALT: 12 U/L (ref 0–44)
AST: 15 U/L (ref 15–41)
Albumin: 3 g/dL — ABNORMAL LOW (ref 3.5–5.0)
Alkaline Phosphatase: 52 U/L (ref 38–126)
Anion gap: 9 (ref 5–15)
BUN: 7 mg/dL (ref 6–20)
CO2: 24 mmol/L (ref 22–32)
Calcium: 8.5 mg/dL — ABNORMAL LOW (ref 8.9–10.3)
Chloride: 100 mmol/L (ref 98–111)
Creatinine, Ser: 0.52 mg/dL (ref 0.44–1.00)
GFR, Estimated: >60 mL/min (ref >60)
Glucose, Bld: 110 mg/dL — ABNORMAL HIGH (ref 70–99)
Potassium: 4 mmol/L (ref 3.5–5.1)
Sodium: 133 mmol/L — ABNORMAL LOW (ref 135–145)
Total Bilirubin: 0.7 mg/dL (ref 0.3–1.2)
Total Protein: 6.1 g/dL — ABNORMAL LOW (ref 6.5–8.1)

## 2021-11-09 LAB — VALPROIC ACID LEVEL: Valproic Acid Lvl: 45 ug/mL — ABNORMAL LOW (ref 50.0–100.0)

## 2021-11-09 LAB — ECHOCARDIOGRAM COMPLETE
Area-P 1/2: 3.12 cm2
S' Lateral: 2.5 cm

## 2021-11-09 MED ORDER — CLOZAPINE 25 MG PO TABS
50.0000 mg | ORAL_TABLET | Freq: Two times a day (BID) | ORAL | Status: DC
  Administered 2021-11-09 – 2021-11-13 (×8): 50 mg via ORAL
  Filled 2021-11-09 (×10): qty 2

## 2021-11-09 MED ORDER — DILTIAZEM HCL 60 MG PO TABS
60.0000 mg | ORAL_TABLET | Freq: Three times a day (TID) | ORAL | Status: DC
  Administered 2021-11-09 (×3): 60 mg via ORAL
  Filled 2021-11-09 (×5): qty 1

## 2021-11-09 MED ORDER — DILTIAZEM HCL 60 MG PO TABS
60.0000 mg | ORAL_TABLET | Freq: Three times a day (TID) | ORAL | Status: DC
  Filled 2021-11-09 (×2): qty 1

## 2021-11-09 MED ORDER — LACTULOSE 10 GM/15ML PO SOLN
10.0000 g | Freq: Two times a day (BID) | ORAL | Status: DC
  Administered 2021-11-09 (×2): 10 g via ORAL
  Filled 2021-11-09 (×3): qty 15

## 2021-11-09 MED ORDER — GUAIFENESIN-DM 100-10 MG/5ML PO SYRP
5.0000 mL | ORAL_SOLUTION | ORAL | Status: DC | PRN
  Administered 2021-11-09: 5 mL via ORAL
  Filled 2021-11-09: qty 5

## 2021-11-09 MED ORDER — SODIUM CHLORIDE 0.9 % IV SOLN: INTRAVENOUS | Status: DC

## 2021-11-09 MED ORDER — DIVALPROEX SODIUM ER 500 MG PO TB24
500.0000 mg | ORAL_TABLET | Freq: Every day | ORAL | Status: DC
  Administered 2021-11-09 – 2021-11-13 (×5): 500 mg via ORAL
  Filled 2021-11-09 (×5): qty 1

## 2021-11-09 NOTE — ED Notes (Signed)
EEG at bedside.

## 2021-11-09 NOTE — ED Notes (Signed)
Pt returned from MRI; SLP at bedside
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
## 2021-11-09 NOTE — ED Notes (Signed)
Breakfast order placed ?

## 2021-11-09 NOTE — Progress Notes (Signed)
PHARMACIST - PHYSICIAN ORDER COMMUNICATION  Teresa Murillo is a 61 y.o. year old female with a history of schizophrenia on Clozapine PTA. Continuing this medication order as an inpatient requires that monitoring parameters per REMS requirements must be met.   Clozapine REMS Dispense Authorization was obtained, and will dispense inpatient.  RDA code [id number].  Verified Clozapine dose:    Last ANC value and date reported on the Clozapine REMS website: 5600 on 11/08/21 [place] monitoring frequency: weekly Next ANC reporting is due on (date) 11/16/21  Bertis Ruddy 11/09/2021, 4:42 PM

## 2021-11-09 NOTE — ED Notes (Signed)
ED TO INPATIENT HANDOFF REPORT  ED Nurse Name and Phone #: Casey Burkitt [phone]   S Name/Age/Gender Teresa Murillo 61 y.o. female Room/Bed: 006C/006C  Code Status   Code Status: Full Code  Home/SNF/Other [place] Patient oriented to: self, place, time, and situation Is this baseline? Yes   Triage Complete: Triage complete  Chief Complaint Atrial flutter with rapid ventricular response [facility]) [I48.92]  Triage Note Patient resides in a "rest home" and reports that they told her she had to come because she isnt doing right. States that they told her she is hallucinating. States that she takes her daily meds as prescribed. Patient denies SI/HI. Alert and oriented   Allergies Allergies  Allergen Reactions   Penicillins Other (See Comments)    Reaction not known at group home    Level of Care/Admitting Diagnosis ED Disposition     ED Disposition  Admit   Condition  --   [place]: [place] [100100]  Level of Care: Progressive [102]  Admit to Progressive based on following criteria: CARDIOVASCULAR & THORACIC of moderate stability with acute coronary syndrome symptoms/low risk myocardial infarction/hypertensive urgency/arrhythmias/heart failure potentially compromising stability and stable post cardiovascular intervention patients.  May admit patient to [facility] or [facility] if equivalent level of care is available:: No  Covid Evaluation: Asymptomatic - no recent exposure (last 10 days) testing not required  Diagnosis: Atrial flutter with rapid ventricular response [facility]) [id number]  Admitting Physician: Marcelyn Bruins [id number]  Attending Physician: Thurnell Lose [id number]  Certification:: I certify this patient will need inpatient services for at least 2 midnights  Estimated Length of Stay: 2          B Medical/Surgery History Past Medical History:  Diagnosis Date   Chronic obstructive pulmonary disease (COPD) ([place])     Glaucoma    Hyperlipidemia    Hypertension    Iron deficiency    Schizoaffective disorder ([place])    Type 2 diabetes mellitus ([place])    Past Surgical History:  Procedure Laterality Date   TUBAL LIGATION       A IV Location/Drains/Wounds Patient Lines/Drains/Airways Status     Active Line/Drains/Airways     Name Placement date Placement time Site Days   Peripheral IV 11/08/21 20 G Anterior;Proximal;Right Forearm 11/08/21  1734  Forearm  1   External Urinary Catheter 11/08/21  1855  --  1            Intake/Output Last 24 hours  Intake/Output Summary (Last 24 hours) at 11/09/2021 1632 Last data filed at 11/09/2021 0606 Gross per 24 hour  Intake 1631.83 ml  Output --  Net 1631.83 ml    Labs/Imaging Results for orders placed or performed during the hospital encounter of 11/08/21 (from the past 48 hour(s))  Comprehensive metabolic panel     Status: Abnormal   Collection Time: 11/08/21 10:48 AM  Result Value Ref Range   Sodium 138 135 - 145 mmol/L   Potassium 4.1 3.5 - 5.1 mmol/L   Chloride 104 98 - 111 mmol/L   CO2 23 22 - 32 mmol/L   Glucose, Bld 129 (H) 70 - 99 mg/dL    Comment: Glucose reference range applies only to samples taken after fasting for at least 8 hours.   BUN 16 6 - 20 mg/dL   Creatinine, Ser 1.17 (H) 0.44 - 1.00 mg/dL   Calcium 9.3 8.9 - 10.3 mg/dL   Total Protein 7.5 6.5 - 8.1 g/dL  Albumin 3.7 3.5 - 5.0 g/dL   AST 20 15 - 41 U/L   ALT 15 0 - 44 U/L   Alkaline Phosphatase 65 38 - 126 U/L   Total Bilirubin 0.5 0.3 - 1.2 mg/dL   GFR, Estimated 54 (L) >60 mL/min    Comment: (NOTE) Calculated using the CKD-EPI Creatinine Equation (2021)    Anion gap 11 5 - 15    Comment: Performed at [facility] [address] [place], [place] [address]  Ethanol     Status: None   Collection Time: 11/08/21 10:48 AM  Result Value Ref Range   Alcohol, Ethyl (B) <10 <10 mg/dL    Comment: (NOTE) Lowest detectable limit for serum alcohol is 10  mg/dL.  For medical purposes only. Performed at [facility], [place] [address] [place], [place] [address]   CBC with Diff     Status: Abnormal   Collection Time: 11/08/21 10:48 AM  Result Value Ref Range   WBC 7.7 4.0 - 10.5 K/uL   RBC 4.73 3.87 - 5.11 MIL/uL   Hemoglobin 12.3 12.0 - 15.0 g/dL   HCT 38.2 36.0 - 46.0 %   MCV 80.8 80.0 - 100.0 fL   MCH 26.0 26.0 - 34.0 pg   MCHC 32.2 30.0 - 36.0 g/dL   RDW 16.7 (H) 11.5 - 15.5 %   Platelets 286 150 - 400 K/uL   nRBC 0.0 0.0 - 0.2 %   Neutrophils Relative % 73 %   Neutro Abs 5.6 1.7 - 7.7 K/uL   Lymphocytes Relative 16 %   Lymphs Abs 1.2 0.7 - 4.0 K/uL   Monocytes Relative 9 %   Monocytes Absolute 0.7 0.1 - 1.0 K/uL   Eosinophils Relative 1 %   Eosinophils Absolute 0.1 0.0 - 0.5 K/uL   Basophils Relative 1 %   Basophils Absolute 0.0 0.0 - 0.1 K/uL   Immature Granulocytes 0 %   Abs Immature Granulocytes 0.02 0.00 - 0.07 K/uL    Comment: Performed at [facility] [address] [place], [place] [address]  I-Stat beta hCG blood, ED     Status: None   Collection Time: 11/08/21 11:07 AM  Result Value Ref Range   I-stat hCG, quantitative <5.0 <5 mIU/mL   Comment 3            Comment:   GEST. AGE      CONC.  (mIU/mL)   <=1 WEEK        5 - 50     2 WEEKS       50 - 500     3 WEEKS       100 - 10,000     4 WEEKS     1,000 - 30,000        FEMALE AND NON-PREGNANT FEMALE:     LESS THAN 5 mIU/mL   Resp Panel by RT-PCR (Flu A&B, Covid) Anterior Nasal Swab     Status: None   Collection Time: 11/08/21  4:33 PM   Specimen: Anterior Nasal Swab  Result Value Ref Range   SARS Coronavirus 2 by RT PCR NEGATIVE NEGATIVE    Comment: (NOTE) SARS-CoV-2 target nucleic acids are NOT DETECTED.  The SARS-CoV-2 RNA is generally detectable in upper respiratory specimens during the acute phase of infection. The lowest concentration of SARS-CoV-2 viral copies this assay can detect is 138 copies/mL. A negative result does not preclude  SARS-Cov-2 infection and should not be used  as the sole basis for treatment or other patient management decisions. A negative result may occur with  improper specimen collection/handling, submission of specimen other than nasopharyngeal swab, presence of viral mutation(s) within the areas targeted by this assay, and inadequate number of viral copies(<138 copies/mL). A negative result must be combined with clinical observations, patient history, and epidemiological information. The expected result is Negative.  Fact Sheet for Patients:  EntrepreneurPulse.com.au  Fact Sheet for Healthcare Providers:  IncredibleEmployment.be  This test is no t yet approved or cleared by the [place] FDA and  has been authorized for detection and/or diagnosis of SARS-CoV-2 by FDA under an Emergency Use Authorization (EUA). This EUA will remain  in effect (meaning this test can be used) for the duration of the COVID-19 declaration under Section 564(b)(1) of the Act, 21 U.S.C.section 360bbb-3(b)(1), unless the authorization is terminated  or revoked sooner.       Influenza A by PCR NEGATIVE NEGATIVE   Influenza B by PCR NEGATIVE NEGATIVE    Comment: (NOTE) The Xpert Xpress SARS-CoV-2/FLU/RSV plus assay is intended as an aid in the diagnosis of influenza from Nasopharyngeal swab specimens and should not be used as a sole basis for treatment. Nasal washings and aspirates are unacceptable for Xpert Xpress SARS-CoV-2/FLU/RSV testing.  Fact Sheet for Patients: EntrepreneurPulse.com.au  Fact Sheet for Healthcare Providers: IncredibleEmployment.be  This test is not yet approved or cleared by the [place] FDA and has been authorized for detection and/or diagnosis of SARS-CoV-2 by FDA under an Emergency Use Authorization (EUA). This EUA will remain in effect (meaning this test can be used) for the duration of the COVID-19  declaration under Section 564(b)(1) of the Act, 21 U.S.C. section 360bbb-3(b)(1), unless the authorization is terminated or revoked.  Performed at [facility], [place] [address], [place], [place] [address]   Ammonia     Status: Abnormal   Collection Time: 11/08/21  5:18 PM  Result Value Ref Range   Ammonia 49 (H) 9 - 35 umol/L    Comment: Performed at [facility], [place] [address] [place], [place] [id number]  Salicylate level     Status: Abnormal   Collection Time: 11/08/21  5:30 PM  Result Value Ref Range   Salicylate Lvl [id number] (L) 7.0 - 30.0 mg/dL    Comment: Performed at [facility] [address], [place], [place] [address]  Acetaminophen level     Status: Abnormal   Collection Time: 11/08/21  5:30 PM  Result Value Ref Range   Acetaminophen (Tylenol), Serum <10 (L) 10 - 30 ug/mL    Comment: (NOTE) Therapeutic concentrations vary significantly. A range of 10-30 ug/mL  may be an effective concentration for many patients. However, some  are best treated at concentrations outside of this range. Acetaminophen concentrations >150 ug/mL at 4 hours after ingestion  and >50 ug/mL at 12 hours after ingestion are often associated with  toxic reactions.  Performed at [facility], [place] [address] [place], [place] [address]   TSH     Status: None   Collection Time: 11/08/21  5:30 PM  Result Value Ref Range   TSH 1.293 0.350 - 4.500 uIU/mL    Comment: Performed by a 3rd Generation assay with a functional sensitivity of <=0.01 uIU/mL. Performed at [facility], [place] [address], [place], [place] [address]   Magnesium     Status: None   Collection Time: 11/08/21  5:30 PM  Result Value Ref  Range   Magnesium 1.7 1.7 - 2.4 mg/dL    Comment: Performed at [facility], [address], [place], [place] [address]  Troponin I (High Sensitivity)     Status: None   Collection Time: 11/08/21  5:30 PM  Result Value Ref Range   Troponin I (High Sensitivity) 7 <18  ng/L    Comment: (NOTE) Elevated high sensitivity troponin I (hsTnI) values and significant  changes across serial measurements may suggest ACS but many other  chronic and acute conditions are known to elevate hsTnI results.  Refer to the "Links" section for chest pain algorithms and additional  guidance. Performed at [facility], [address] [place], [place] [address]   I-Stat venous blood gas, ED     Status: Abnormal   Collection Time: 11/08/21  6:58 PM  Result Value Ref Range   pH, Ven 7.387 7.25 - 7.43   pCO2, Ven 38.5 (L) 44 - 60 mmHg   pO2, Ven 168 (H) 32 - 45 mmHg   Bicarbonate 23.1 20.0 - 28.0 mmol/L   TCO2 24 22 - 32 mmol/L   O2 Saturation 100 %   Acid-base deficit 2.0 0.0 - 2.0 mmol/L   Sodium 135 135 - 145 mmol/L   Potassium 3.9 3.5 - 5.1 mmol/L   Calcium, Ion 1.10 (L) 1.15 - 1.40 mmol/L   HCT 36.0 36.0 - 46.0 %   Hemoglobin 12.2 12.0 - 15.0 g/dL   Sample type VENOUS   Urine rapid drug screen (hosp performed)     Status: None   Collection Time: 11/08/21  9:11 PM  Result Value Ref Range   Opiates NONE DETECTED NONE DETECTED   Cocaine NONE DETECTED NONE DETECTED   Benzodiazepines NONE DETECTED NONE DETECTED   Amphetamines NONE DETECTED NONE DETECTED   Tetrahydrocannabinol NONE DETECTED NONE DETECTED   Barbiturates NONE DETECTED NONE DETECTED    Comment: (NOTE) DRUG SCREEN FOR MEDICAL PURPOSES ONLY.  IF CONFIRMATION IS NEEDED FOR ANY PURPOSE, NOTIFY LAB WITHIN 5 DAYS.  LOWEST DETECTABLE LIMITS FOR URINE DRUG SCREEN Drug Class                     Cutoff (ng/mL) Amphetamine and metabolites    1000 Barbiturate and metabolites    200 Benzodiazepine                 200 Tricyclics and metabolites     300 Opiates and metabolites        300 Cocaine and metabolites        300 THC                            50 Performed at [facility], [address], [place], [place] [address]   Urinalysis, Routine w reflex microscopic Urine, Clean Catch      Status: Abnormal   Collection Time: 11/08/21  9:11 PM  Result Value Ref Range   Color, Urine STRAW (A) YELLOW   APPearance CLEAR CLEAR   Specific Gravity, Urine 1.026 1.005 - 1.030   pH 6.0 5.0 - 8.0   Glucose, UA NEGATIVE NEGATIVE mg/dL   Hgb urine dipstick NEGATIVE NEGATIVE   Bilirubin Urine NEGATIVE NEGATIVE   Ketones, ur NEGATIVE NEGATIVE mg/dL   Protein, ur NEGATIVE NEGATIVE mg/dL   Nitrite NEGATIVE NEGATIVE   Leukocytes,Ua NEGATIVE NEGATIVE    Comment: Performed at [facility], [address]  [place], [place] [address]  Troponin I (High Sensitivity)     Status: None   Collection Time: 11/08/21  9:18 PM  Result Value Ref Range   Troponin I (High Sensitivity) 8 <18 ng/L    Comment: (NOTE) Elevated high sensitivity troponin I (hsTnI) values and significant  changes across serial measurements may suggest ACS but many other  chronic and acute conditions are known to elevate hsTnI results.  Refer to the "Links" section for chest pain algorithms and additional  guidance. Performed at [facility], [place] [address], [place], [place] [address]   Brain natriuretic peptide     Status: None   Collection Time: 11/08/21  9:18 PM  Result Value Ref Range   B Natriuretic Peptide 21.1 0.0 - 100.0 pg/mL    Comment: Performed at [facility] [address] [place], [place] [address]  CBG monitoring, ED     Status: Abnormal   Collection Time: 11/08/21 11:20 PM  Result Value Ref Range   Glucose-Capillary 218 (H) 70 - 99 mg/dL    Comment: Glucose reference range applies only to samples taken after fasting for at least 8 hours.  Comprehensive metabolic panel     Status: Abnormal   Collection Time: 11/09/21  4:51 AM  Result Value Ref Range   Sodium 133 (L) 135 - 145 mmol/L   Potassium 4.0 3.5 - 5.1 mmol/L   Chloride 100 98 - 111 mmol/L   CO2 24 22 - 32 mmol/L   Glucose, Bld 110 (H) 70 - 99 mg/dL    Comment: Glucose reference range applies only to samples taken after  fasting for at least 8 hours.   BUN 7 6 - 20 mg/dL   Creatinine, Ser 0.52 0.44 - 1.00 mg/dL   Calcium 8.5 (L) 8.9 - 10.3 mg/dL   Total Protein 6.1 (L) 6.5 - 8.1 g/dL   Albumin 3.0 (L) 3.5 - 5.0 g/dL   AST 15 15 - 41 U/L   ALT 12 0 - 44 U/L   Alkaline Phosphatase 52 38 - 126 U/L   Total Bilirubin 0.7 0.3 - 1.2 mg/dL   GFR, Estimated >60 >60 mL/min    Comment: (NOTE) Calculated using the CKD-EPI Creatinine Equation (2021)    Anion gap 9 5 - 15    Comment: Performed at [facility], [place] [address] [place], [place] [address]  CBC     Status: Abnormal   Collection Time: 11/09/21  4:51 AM  Result Value Ref Range   WBC 6.9 4.0 - 10.5 K/uL   RBC 4.25 3.87 - 5.11 MIL/uL   Hemoglobin 11.2 (L) 12.0 - 15.0 g/dL   HCT 34.0 (L) 36.0 - 46.0 %   MCV 80.0 80.0 - 100.0 fL   MCH 26.4 26.0 - 34.0 pg   MCHC 32.9 30.0 - 36.0 g/dL   RDW 16.8 (H) 11.5 - 15.5 %   Platelets 255 150 - 400 K/uL   nRBC 0.0 0.0 - 0.2 %    Comment: Performed at [facility], [place] [address], [place], [place] [address]  CBG monitoring, ED     Status: None   Collection Time: 11/09/21  7:43 AM  Result Value Ref Range   Glucose-Capillary 97 70 - 99 mg/dL    Comment: Glucose reference range applies only to samples taken after fasting for at least 8 hours.  CBG monitoring, ED     Status: Abnormal   Collection Time: 11/09/21 11:38 AM  Result  Value Ref Range   Glucose-Capillary 206 (H) 70 - 99 mg/dL    Comment: Glucose reference range applies only to samples taken after fasting for at least 8 hours.  CBG monitoring, ED     Status: None   Collection Time: 11/09/21  4:21 PM  Result Value Ref Range   Glucose-Capillary 78 70 - 99 mg/dL    Comment: Glucose reference range applies only to samples taken after fasting for at least 8 hours.   MR BRAIN WO CONTRAST  Result Date: 11/09/2021 CLINICAL DATA:  Presents with encephalopathy and hallucinations. History of schizophrenia, alcoholism, anemia. EXAM: MRI HEAD WITHOUT  CONTRAST TECHNIQUE: Multiplanar, multiecho pulse sequences of the brain and surrounding structures were obtained without intravenous contrast. COMPARISON:  CT head 1 day prior FINDINGS: Brain: There is no acute intracranial hemorrhage, extra-axial fluid collection, or acute infarct. Parenchymal volume is normal. The ventricles are normal in size. Gray-white differentiation is preserved. There are scattered small foci of FLAIR signal abnormality in the supratentorial white matter likely reflecting sequela of minimal underlying chronic small vessel ischemic change. There is no suspicious parenchymal signal abnormality. There is no mass lesion. There is no mass effect or midline shift. Vascular: Normal flow voids. Skull and upper cervical spine: There is no suspicious marrow signal abnormality. There is apparent moderate spinal canal stenosis at C2-C3 with possible cord compression. Sinuses/Orbits: The paranasal sinuses are clear. The globes and orbits are unremarkable. Other: None. IMPRESSION: 1. No acute intracranial pathology. 2. 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 Consider cervical spine MRI for better evaluation. Electronically Signed   By: Lesia Hausen M.D.   On: 11/09/2021 11:15   EEG adult  Result Date: 11/09/2021 Charlsie Quest, MD     11/09/2021 11:20 AM Patient Name: Mallissa Lorenzen MRN: [id number] Epilepsy Attending: Charlsie Quest Referring Physician/Provider: Leroy Sea, MD Date: 11/09/2021 Duration: 22.17 mins Patient history: 61 year old female with altered mental status.  EEG to evaluate for seizure. Level of alertness: Awake AEDs during EEG study: None Technical aspects: This EEG study was done with scalp electrodes positioned according to the 10-20 International system of electrode placement. Electrical activity was reviewed with band pass filter of 1-70Hz , sensitivity of 7 uV/mm, display speed of 88mm/sec with a 60Hz  notched filter applied as appropriate. EEG  data were recorded continuously and digitally stored.  Video monitoring was available and reviewed as appropriate. Description: The posterior dominant rhythm consists of 8 Hz activity of moderate voltage (25-35 uV) seen predominantly in posterior head regions, symmetric and reactive to eye opening and eye closing. EEG showed intermittent generalized 3 to 6 Hz theta-delta slowing. Hyperventilation and photic stimulation were not performed.   ABNORMALITY - Intermittent slow, generalized IMPRESSION: This study is suggestive of mild diffuse encephalopathy, nonspecific etiology. No seizures or epileptiform discharges were seen throughout the recording. Priyanka   CT HEAD WO CONTRAST (Annabelle Harman)  Result Date: 11/08/2021 CLINICAL DATA:  Hallucinations EXAM: CT HEAD WITHOUT CONTRAST TECHNIQUE: Contiguous axial images were obtained from the base of the skull through the vertex without intravenous contrast. RADIATION DOSE REDUCTION: This exam was performed according to the departmental dose-optimization program which includes automated exposure control, adjustment of the mA and/or kV according to patient size and/or use of iterative reconstruction technique. COMPARISON:  09/09/2021 FINDINGS: Brain: No evidence of acute infarction, hemorrhage, hydrocephalus, extra-axial collection or mass lesion/mass effect. Vascular: Mild intracranial atherosclerosis. Skull: Normal. Negative for fracture or focal lesion. Sinuses/Orbits: The visualized paranasal sinuses are  essentially clear. The mastoid air cells are unopacified. Other: None.  Normal head CT. IMPRESSION: Normal head CT. Electronically Signed   By: Julian Hy M.D.   On: 11/08/2021 19:04   CT ABDOMEN PELVIS W CONTRAST  Result Date: 11/08/2021 CLINICAL DATA:  Abdominal pain EXAM: CT ABDOMEN AND PELVIS WITH CONTRAST TECHNIQUE: Multidetector CT imaging of the abdomen and pelvis was performed using the standard protocol following bolus administration of intravenous  contrast. RADIATION DOSE REDUCTION: This exam was performed according to the departmental dose-optimization program which includes automated exposure control, adjustment of the mA and/or kV according to patient size and/or use of iterative reconstruction technique. CONTRAST:  136mL OMNIPAQUE IOHEXOL 300 MG/ML  SOLN COMPARISON:  None Available. FINDINGS: Lower chest: Minimal scarring/atelectasis the lung bases. Trace right pleural fluid. Hepatobiliary: Liver is within normal limits. Gallbladder is decompressed. No intrahepatic or extrahepatic duct dilatation. Pancreas: Within normal limits. Spleen: Within normal limits Adrenals/Urinary Tract: Adrenal glands are within normal limits. Kidneys are within normal limits. Right extrarenal pelvis. No frank hydronephrosis. Bladder is within normal limits. Stomach/Bowel: Stomach is notable for a large hiatal hernia. No evidence of bowel obstruction. Normal appendix (series 3/image 65). No colonic wall thickening or inflammatory changes. Vascular/Lymphatic: No evidence of abdominal aortic aneurysm. Atherosclerotic calcifications of the abdominal aorta and branch vessels. No suspicious abdominopelvic lymphadenopathy. Reproductive: Uterus is within normal limits. Bilateral ovaries are within normal limits. Other: No abdominopelvic ascites. Musculoskeletal: Mild degenerative changes of the lower thoracic spine. IMPRESSION: No CT findings to account for the patient's abdominal pain. Large hiatal hernia. Electronically Signed   By: Julian Hy M.D.   On: 11/08/2021 19:03   DG Chest Portable 1 View  Result Date: 11/08/2021 CLINICAL DATA:  Altered mental status, tachycardia, nausea and vomiting. EXAM: PORTABLE CHEST 1 VIEW COMPARISON:  Chest x-ray dated 06/02/2020 FINDINGS: Grossly stable cardiomegaly. No evidence of active CHF. No evidence of pneumonia or pulmonary edema. No pleural effusion or pneumothorax is seen. Moderate-sized hiatal hernia. IMPRESSION: 1. No active  disease. No evidence of CHF or pneumonia. 2. Stable cardiomegaly. Electronically Signed   By: Franki Cabot M.D.   On: 11/08/2021 17:58    Pending Labs Unresulted Labs (From admission, onward)     Start     Ordered   11/10/21 0500  Magnesium  Daily at 5am,   R     Question:  Specimen collection method  Answer:  Lab=Lab collect   11/09/21 0537   11/10/21 0500  Comprehensive metabolic panel  Daily at 5am,   R     Question:  Specimen collection method  Answer:  Lab=Lab collect   11/09/21 0537   11/10/21 0500  CBC with Differential/Platelet  Daily at 5am,   R     Question:  Specimen collection method  Answer:  Lab=Lab collect   11/09/21 0537   11/10/21 0500  Brain natriuretic peptide  Daily at 5am,   R     Question:  Specimen collection method  Answer:  Lab=Lab collect   11/09/21 0537   11/10/21 0500  Ammonia  Tomorrow morning,   R        11/09/21 1219   11/09/21 1617  Valproic acid level  Once,   R        11/09/21 1616   11/08/21 2105  HIV Antibody (routine testing w rflx)  (HIV Antibody (Routine testing w reflex) panel)  Once,   R        11/08/21 2117   11/08/21 1812  Blood gas, venous  Once,   R        11/08/21 1811   11/08/21 1718  Urine Culture  Once,   URGENT       Question:  Indication  Answer:  Altered mental status (if no other cause identified)   11/08/21 1719            Vitals/Pain Today's Vitals   11/09/21 1151 11/09/21 1622 11/09/21 1627 11/09/21 1628  BP:  118/74    Pulse:  80    Resp:  19    Temp:    98.2 F (36.8 C)  TempSrc:    Oral  SpO2:  94%    PainSc: 2   10-Worst pain ever     Isolation Precautions No active isolations  Medications Medications  diltiazem (CARDIZEM) 125 mg in dextrose 5% 125 mL (1 mg/mL) infusion (0 mg/hr Intravenous Stopped 11/09/21 0851)  mometasone-formoterol (DULERA) 200-5 MCG/ACT inhaler 2 puff (2 puffs Inhalation Given 11/09/21 0747)  albuterol (PROVENTIL) (2.5 MG/3ML) 0.083% nebulizer solution 3 mL (has no administration  in time range)  latanoprost (XALATAN) 0.005 % ophthalmic solution 1 drop (1 drop Both Eyes Given 11/09/21 0037)  sodium chloride flush (NS) 0.9 % injection 3 mL (3 mLs Intravenous Given 11/09/21 1019)  acetaminophen (TYLENOL) tablet 650 mg (650 mg Oral Given 11/09/21 1628)    Or  acetaminophen (TYLENOL) suppository 650 mg ( Rectal See Alternative 11/09/21 1628)  polyethylene glycol (MIRALAX / GLYCOLAX) packet 17 g (has no administration in time range)  insulin aspart (novoLOG) injection 0-9 Units ( Subcutaneous Not Given 11/09/21 1622)  apixaban (ELIQUIS) tablet 5 mg (5 mg Oral Given 11/09/21 1020)  0.9 %  sodium chloride infusion ( Intravenous New Bag/Given 11/09/21 1221)  divalproex (DEPAKOTE ER) 24 hr tablet 500 mg (500 mg Oral Given 11/09/21 1308)  diltiazem (CARDIZEM) tablet 60 mg (60 mg Oral Given 11/09/21 1627)  lactulose (CHRONULAC) 10 GM/15ML solution 10 g (10 g Oral Given 11/09/21 1308)  cloZAPine (CLOZARIL) tablet 50 mg (has no administration in time range)  sodium chloride 0.9 % bolus 1,000 mL (0 mLs Intravenous Stopped 11/08/21 1855)  iohexol (OMNIPAQUE) 300 MG/ML solution 100 mL (100 mLs Intravenous Contrast Given 11/08/21 1847)    Mobility walks High fall risk   Focused Assessments Neuro Assessment Handoff:  Swallow screen pass? Yes  Cardiac Rhythm: Normal sinus rhythm NIH Stroke Scale ( + Modified Stroke Scale Criteria)  LOC Questions (1b. )   +: Answers both questions correctly LOC Commands (1c. )   + : Performs both tasks correctly Best Gaze (2. )  +: Normal Visual (3. )  +: No visual loss Motor Arm, Left (5a. )   +: Drift Motor Arm, Right (5b. )   +: No drift Motor Leg, Left (6a. )   +: No drift Motor Leg, Right (6b. )   +: No drift Sensory (8. )   +: Normal, no sensory loss Best Language (9. )   +: No aphasia Extinction/Inattention (11.)   +: No Abnormality Modified SS Total  +: 1     Neuro Assessment: Within Defined Limits Neuro Checks:      Last Documented NIHSS  Modified Score: 1 (11/08/21 2128)    R Recommendations: See Admitting Provider Note  Report given to:   Additional Notes:

## 2021-11-09 NOTE — Progress Notes (Deleted)
Cardiology Office Note:    Date:  11/09/2021   ID:  [name], DOB [birth year], MRN [id number]  PCP:  Christen Bame, PA   [facility] Providers Cardiologist:  None { Click to update primary MD,subspecialty MD or APP then REFRESH:1}    Referring MD: Christen Bame, PA   No chief complaint on file. ***  History of Present Illness:    Berta Denson is a 61 y.o. female with a hx of COPD, T2DM, sch  Past Medical History:  Diagnosis Date   Chronic obstructive pulmonary disease (COPD) (HCC)    Glaucoma    Hyperlipidemia    Hypertension    Iron deficiency    Schizoaffective disorder (HCC)    Type 2 diabetes mellitus (HCC)     Past Surgical History:  Procedure Laterality Date   TUBAL LIGATION      Current Medications: No outpatient medications have been marked as taking for the 11/09/21 encounter (Appointment) with Maisie Fus, MD.     Allergies:   Penicillins   Social History   Socioeconomic History   Marital status: Divorced    Spouse name: Not on file   Number of children: Not on file   Years of education: Not on file   Highest education level: Not on file  Occupational History   Not on file  Tobacco Use   Smoking status: Every Day    Packs/day: 1.00    Types: Cigars, Cigarettes   Smokeless tobacco: Current  Vaping Use   Vaping Use: Never used  Substance and Sexual Activity   Alcohol use: Yes   Drug use: Not Currently   Sexual activity: Not Currently    Birth control/protection: Surgical  Other Topics Concern   Not on file  Social History Narrative   Not on file   Social Determinants of Health   Financial Resource Strain: Not on file  Food Insecurity: Not on file  Transportation Needs: Not on file  Physical Activity: Not on file  Stress: Not on file  Social Connections: Not on file     Family History: The patient's ***family history includes Breast cancer in her maternal grandmother.  ROS:   Please see the history of present  illness.    *** All other systems reviewed and are negative.  EKGs/Labs/Other Studies Reviewed:    The following studies were reviewed today: ***  EKG:  EKG is *** ordered today.  The ekg ordered today demonstrates ***  Recent Labs: 11/08/2021: B Natriuretic Peptide 21.1; Magnesium 1.7; TSH 1.293 11/09/2021: ALT 12; BUN 7; Creatinine, Ser 0.52; Hemoglobin 11.2; Platelets 255; Potassium 4.0; Sodium 133  Recent Lipid Panel    Component Value Date/Time   CHOL 178 10/04/2021 1526   TRIG 155 (H) 10/04/2021 1526   HDL 52 10/04/2021 1526   CHOLHDL 3.4 10/04/2021 1526   VLDL 31 10/04/2021 1526   LDLCALC 95 10/04/2021 1526     Risk Assessment/Calculations:   {Does this patient have ATRIAL FIBRILLATION?:[phone]}            Physical Exam:    VS:  There were no vitals taken for this visit.    Wt Readings from Last 3 Encounters:  08/22/21 135 lb (61.2 kg)  11/01/20 150 lb (68 kg)  06/02/20 153 lb (69.4 kg)     GEN: *** Well nourished, well developed in no acute distress HEENT: Normal NECK: No JVD; No carotid bruits LYMPHATICS: No lymphadenopathy CARDIAC: ***RRR, no murmurs, rubs, gallops RESPIRATORY:  Clear to auscultation  without rales, wheezing or rhonchi  ABDOMEN: Soft, non-tender, non-distended MUSCULOSKELETAL:  No edema; No deformity  SKIN: Warm and dry NEUROLOGIC:  Alert and oriented x 3 PSYCHIATRIC:  Normal affect   ASSESSMENT:    No diagnosis found. PLAN:    In order of problems listed above:  ***      {Are you ordering a CV Procedure (e.g. stress test, cath, DCCV, TEE, etc)?   Press F2        :[id number]}    Medication Adjustments/Labs and Tests Ordered: Current medicines are reviewed at length with the patient today.  Concerns regarding medicines are outlined above.  No orders of the defined types were placed in this encounter.  No orders of the defined types were placed in this encounter.   There are no Patient Instructions on file for this  visit.   Signed, Maisie Fus, MD  11/09/2021 8:10 AM    [facility]

## 2021-11-09 NOTE — ED Notes (Signed)
Patient transported to MRI 

## 2021-11-09 NOTE — ED Notes (Signed)
Pt alert, answering questions appropriately, eating breakfast

## 2021-11-09 NOTE — Progress Notes (Signed)
PROGRESS NOTE                                                                                                                                                                                                             Patient Demographics:    Teresa Murillo, is a 61 y.o. female, DOB - 03-04-1960, [id number]  Outpatient Primary MD for the patient is Ileene Hutchinson, [place]    LOS - 0  Admit date - 11/08/2021    Chief Complaint  Patient presents with   Medical Clearance       Brief Narrative (HPI from H&P)  61 y.o. female with medical history significant of schizophrenia, COPD, hyperlipidemia, hypertension, diabetes, alcohol, hypothyroidism, anemia presenting with encephalopathy and hallucinations.  In the ER work-up consistent with acute encephalopathy, AKI along with A-fib RVR.   Subjective:    [facility] today has, No headache, No chest pain, No abdominal pain - No Nausea, No new weakness tingling or numbness, no SOB   Assessment  & Plan :    Newly diagnosed likely paroxysmal atrial flutter with RVR with [place] vas 2 score of 2 or higher.   Patient been placed on IV Cardizem drip along with oral Cardizem, if rate stays under 100 for couple of hours will titrate off IV Cardizem drip, continue Eliquis for now.  TSH stable, echo pending.   Acute encephalopathy -  Patient presenting from facility after issues with acute cephalopathy and hallucinations.  She is have history of schizophrenia, CT head and MRI brain nonacute, CT abdomen pelvis nonacute, no headache or nuchal rigidity, likely mildly dehydrated for which she is getting IV fluids, will request psych to evaluate as well.  Defer management of psych medications to the psych team.   AKI -  Elevated 1.17 baseline around 0.5.  Likely due to dehydration hydrate with IV fluids monitor bladder scans.  CT abdomen pelvis nonacute.   Schizophrenia  - per SNF was  hallucinating prior to hospital transfer, have consulted psych to assist with schizophrenia medications and management.  COPD - Symbicort with formulary Dulera, continue as needed albuterol   Hypertension  - Hold home antihypertensives currently on diltiazem.   Glaucoma - Continue home eyedrops   Borderline elevation of ammonia.  Placed on lactulose.    Diabetes - SSI    Lab Results  Component  Value Date   HGBA1C 7.0 (H) 10/04/2021   CBG (last 3)  Recent Labs    11/08/21 2320 11/09/21 0743 11/09/21 1138  GLUCAP 218* 97 206*         Condition - Fair  Family Communication  : None present.  Code Status :  Full  Consults  :  Psych, Cards  PUD Prophylaxis : PPI   Procedures  :     EEG -  CT head and MRI brain.  Both nonacute.  CT scan abdomen pelvis.  Nonacute.      Disposition Plan  :    Status is: Observation  DVT Prophylaxis  :     apixaban (ELIQUIS) tablet 5 mg   Lab Results  Component Value Date   PLT 255 11/09/2021    Diet :  Diet Order             Diet heart healthy/carb modified Room service appropriate? Yes; Fluid consistency: Thin  Diet effective now                    Inpatient Medications  Scheduled Meds:  apixaban  5 mg Oral BID   diltiazem  60 mg Oral Q8H   divalproex  500 mg Oral Daily   insulin aspart  0-9 Units Subcutaneous TID WC   latanoprost  1 drop Both Eyes QHS   mometasone-formoterol  2 puff Inhalation BID   sodium chloride flush  3 mL Intravenous Q12H   Continuous Infusions:  sodium chloride 75 mL/hr at 11/09/21 0605   PRN Meds:.acetaminophen **OR** acetaminophen, albuterol, polyethylene glycol  Antibiotics  :    Anti-infectives (From admission, onward)    None        Time Spent in minutes  30   Lala Lund M.D on 11/09/2021 at 12:12 PM  To page go to [email or web address]   [facility] -  Office  [phone]  See all Orders from today for further details    Objective:   Vitals:    11/09/21 1019 11/09/21 1030 11/09/21 1100 11/09/21 1139  BP:  122/70 107/63   Pulse: 87 77 91 96  Resp: 17 16 15    Temp:    97.7 F (36.5 C)  TempSrc:      SpO2: 96% 97% 94% 97%    Wt Readings from Last 3 Encounters:  08/22/21 61.2 kg  11/01/20 68 kg  06/02/20 69.4 kg     Intake/Output Summary (Last 24 hours) at 11/09/2021 1212 Last data filed at 11/09/2021 0606 Gross per 24 hour  Intake 1631.83 ml  Output --  Net 1631.83 ml     Physical Exam  Awake Alert, No new F.N deficits, Normal affect .AT,PERRAL Supple Neck, No JVD,   Symmetrical Chest wall movement, Good air movement bilaterally, CTAB RRR,No Gallops,Rubs or new Murmurs,  +ve B.Sounds, Abd Soft, No tenderness,   No Cyanosis, Clubbing or edema     RN pressure injury documentation:     Data Review:    CBC Recent Labs  Lab 11/08/21 1048 11/08/21 1858 11/09/21 0451  WBC 7.7  --  6.9  HGB 12.3 12.2 11.2*  HCT 38.2 36.0 34.0*  PLT 286  --  255  MCV 80.8  --  80.0  MCH 26.0  --  26.4  MCHC 32.2  --  32.9  RDW 16.7*  --  16.8*  LYMPHSABS 1.2  --   --   MONOABS 0.7  --   --   EOSABS 0.1  --   --  BASOSABS 0.0  --   --     Electrolytes Recent Labs  Lab 11/08/21 1048 11/08/21 1718 11/08/21 1730 11/08/21 1858 11/08/21 2118 11/09/21 0451  NA 138  --   --  135  --  133*  K 4.1  --   --  3.9  --  4.0  CL 104  --   --   --   --  100  CO2 23  --   --   --   --  24  GLUCOSE 129*  --   --   --   --  110*  BUN 16  --   --   --   --  7  CREATININE 1.17*  --   --   --   --  0.52  CALCIUM 9.3  --   --   --   --  8.5*  AST 20  --   --   --   --  15  ALT 15  --   --   --   --  12  ALKPHOS 65  --   --   --   --  52  BILITOT 0.5  --   --   --   --  0.7  ALBUMIN 3.7  --   --   --   --  3.0*  MG  --   --  1.7  --   --   --   TSH  --   --  1.293  --   --   --   AMMONIA  --  49*  --   --   --   --   BNP  --   --   --   --  21.1  --      ------------------------------------------------------------------------------------------------------------------ No results for input(s): "CHOL", "HDL", "LDLCALC", "TRIG", "CHOLHDL", "LDLDIRECT" in the last 72 hours.  Lab Results  Component Value Date   HGBA1C 7.0 (H) 10/04/2021    Recent Labs    11/08/21 1730  TSH 1.293   ------------------------------------------------------------------------------------------------------------------ ID Labs Recent Labs  Lab 11/08/21 1048 11/09/21 0451  WBC 7.7 6.9  PLT 286 255  CREATININE 1.17* 0.52   Cardiac Enzymes No results for input(s): "CKMB", "TROPONINI", "MYOGLOBIN" in the last 168 hours.  Invalid input(s): "CK"       Micro Results Recent Results (from the past 240 hour(s))  Resp Panel by RT-PCR (Flu A&B, Covid) Anterior Nasal Swab     Status: None   Collection Time: 11/08/21  4:33 PM   Specimen: Anterior Nasal Swab  Result Value Ref Range Status   SARS Coronavirus 2 by RT PCR NEGATIVE NEGATIVE Final    Comment: (NOTE) SARS-CoV-2 target nucleic acids are NOT DETECTED.  The SARS-CoV-2 RNA is generally detectable in upper respiratory specimens during the acute phase of infection. The lowest concentration of SARS-CoV-2 viral copies this assay can detect is 138 copies/mL. A negative result does not preclude SARS-Cov-2 infection and should not be used as the sole basis for treatment or other patient management decisions. A negative result may occur with  improper specimen collection/handling, submission of specimen other than nasopharyngeal swab, presence of viral mutation(s) within the areas targeted by this assay, and inadequate number of viral copies(<138 copies/mL). A negative result must be combined with clinical observations, patient history, and epidemiological information. The expected result is Negative.  Fact Sheet for Patients:  BloggerCourse.com  Fact Sheet for Healthcare  Providers:  SeriousBroker.it  This test is no  t yet approved or cleared by the [place] and  has been authorized for detection and/or diagnosis of SARS-CoV-2 by FDA under an Emergency Use Authorization (EUA). This EUA will remain  in effect (meaning this test can be used) for the duration of the COVID-19 declaration under Section 564(b)(1) of the Act, 21 U.S.C.section 360bbb-3(b)(1), unless the authorization is terminated  or revoked sooner.       Influenza A by PCR NEGATIVE NEGATIVE Final   Influenza B by PCR NEGATIVE NEGATIVE Final    Comment: (NOTE) The Xpert Xpress SARS-CoV-2/FLU/RSV plus assay is intended as an aid in the diagnosis of influenza from Nasopharyngeal swab specimens and should not be used as a sole basis for treatment. Nasal washings and aspirates are unacceptable for Xpert Xpress SARS-CoV-2/FLU/RSV testing.  Fact Sheet for Patients: BloggerCourse.com  Fact Sheet for Healthcare Providers: SeriousBroker.it  This test is not yet approved or cleared by the [place] FDA and has been authorized for detection and/or diagnosis of SARS-CoV-2 by FDA under an Emergency Use Authorization (EUA). This EUA will remain in effect (meaning this test can be used) for the duration of the COVID-19 declaration under Section 564(b)(1) of the Act, 21 U.S.C. section 360bbb-3(b)(1), unless the authorization is terminated or revoked.  Performed at [facility], [address] [place], [place] [address]     Radiology Reports MR BRAIN WO CONTRAST  Result Date: 11/09/2021 CLINICAL DATA:  Presents with encephalopathy and hallucinations. History of schizophrenia, alcoholism, anemia. EXAM: MRI HEAD WITHOUT CONTRAST TECHNIQUE: Multiplanar, multiecho pulse sequences of the brain and surrounding structures were obtained without intravenous contrast. COMPARISON:  CT head 1 day prior FINDINGS:  Brain: There is no acute intracranial hemorrhage, extra-axial fluid collection, or acute infarct. Parenchymal volume is normal. The ventricles are normal in size. Gray-white differentiation is preserved. There are scattered small foci of FLAIR signal abnormality in the supratentorial white matter likely reflecting sequela of minimal underlying chronic small vessel ischemic change. There is no suspicious parenchymal signal abnormality. There is no mass lesion. There is no mass effect or midline shift. Vascular: Normal flow voids. Skull and upper cervical spine: There is no suspicious marrow signal abnormality. There is apparent moderate spinal canal stenosis at C2-C3 with possible cord compression. Sinuses/Orbits: The paranasal sinuses are clear. The globes and orbits are unremarkable. Other: None. IMPRESSION: 1. No acute intracranial pathology. 2. 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 Consider cervical spine MRI for better evaluation. Electronically Signed   By: Lesia Hausen M.D.   On: 11/09/2021 11:15   EEG adult  Result Date: 11/09/2021 Charlsie Quest, MD     11/09/2021 11:20 AM Patient Name: Teresa Murillo MRN: [id number] Epilepsy Attending: Charlsie Quest Referring Physician/Provider: Leroy Sea, MD Date: 11/09/2021 Duration: 22.17 mins Patient history: 61 year old female with altered mental status.  EEG to evaluate for seizure. Level of alertness: Awake AEDs during EEG study: None Technical aspects: This EEG study was done with scalp electrodes positioned according to the 10-20 International system of electrode placement. Electrical activity was reviewed with band pass filter of 1-70Hz , sensitivity of 7 uV/mm, display speed of 15mm/sec with a 60Hz  notched filter applied as appropriate. EEG data were recorded continuously and digitally stored.  Video monitoring was available and reviewed as appropriate. Description: The posterior dominant rhythm consists of 8 Hz activity  of moderate voltage (25-35 uV) seen predominantly in posterior head regions, symmetric and reactive to eye opening and eye closing. EEG showed  intermittent generalized 3 to 6 Hz theta-delta slowing. Hyperventilation and photic stimulation were not performed.   ABNORMALITY - Intermittent slow, generalized IMPRESSION: This study is suggestive of mild diffuse encephalopathy, nonspecific etiology. No seizures or epileptiform discharges were seen throughout the recording. Priyanka Barbra Sarks   CT HEAD WO CONTRAST (5MM)  Result Date: 11/08/2021 CLINICAL DATA:  Hallucinations EXAM: CT HEAD WITHOUT CONTRAST TECHNIQUE: Contiguous axial images were obtained from the base of the skull through the vertex without intravenous contrast. RADIATION DOSE REDUCTION: This exam was performed according to the departmental dose-optimization program which includes automated exposure control, adjustment of the mA and/or kV according to patient size and/or use of iterative reconstruction technique. COMPARISON:  09/09/2021 FINDINGS: Brain: No evidence of acute infarction, hemorrhage, hydrocephalus, extra-axial collection or mass lesion/mass effect. Vascular: Mild intracranial atherosclerosis. Skull: Normal. Negative for fracture or focal lesion. Sinuses/Orbits: The visualized paranasal sinuses are essentially clear. The mastoid air cells are unopacified. Other: None.  Normal head CT. IMPRESSION: Normal head CT. Electronically Signed   By: Julian Hy M.D.   On: 11/08/2021 19:04   CT ABDOMEN PELVIS W CONTRAST  Result Date: 11/08/2021 CLINICAL DATA:  Abdominal pain EXAM: CT ABDOMEN AND PELVIS WITH CONTRAST TECHNIQUE: Multidetector CT imaging of the abdomen and pelvis was performed using the standard protocol following bolus administration of intravenous contrast. RADIATION DOSE REDUCTION: This exam was performed according to the departmental dose-optimization program which includes automated exposure control, adjustment of the mA  and/or kV according to patient size and/or use of iterative reconstruction technique. CONTRAST:  135mL OMNIPAQUE IOHEXOL 300 MG/ML  SOLN COMPARISON:  None Available. FINDINGS: Lower chest: Minimal scarring/atelectasis the lung bases. Trace right pleural fluid. Hepatobiliary: Liver is within normal limits. Gallbladder is decompressed. No intrahepatic or extrahepatic duct dilatation. Pancreas: Within normal limits. Spleen: Within normal limits Adrenals/Urinary Tract: Adrenal glands are within normal limits. Kidneys are within normal limits. Right extrarenal pelvis. No frank hydronephrosis. Bladder is within normal limits. Stomach/Bowel: Stomach is notable for a large hiatal hernia. No evidence of bowel obstruction. Normal appendix (series 3/image 65). No colonic wall thickening or inflammatory changes. Vascular/Lymphatic: No evidence of abdominal aortic aneurysm. Atherosclerotic calcifications of the abdominal aorta and branch vessels. No suspicious abdominopelvic lymphadenopathy. Reproductive: Uterus is within normal limits. Bilateral ovaries are within normal limits. Other: No abdominopelvic ascites. Musculoskeletal: Mild degenerative changes of the lower thoracic spine. IMPRESSION: No CT findings to account for the patient's abdominal pain. Large hiatal hernia. Electronically Signed   By: Julian Hy M.D.   On: 11/08/2021 19:03   DG Chest Portable 1 View  Result Date: 11/08/2021 CLINICAL DATA:  Altered mental status, tachycardia, nausea and vomiting. EXAM: PORTABLE CHEST 1 VIEW COMPARISON:  Chest x-ray dated 06/02/2020 FINDINGS: Grossly stable cardiomegaly. No evidence of active CHF. No evidence of pneumonia or pulmonary edema. No pleural effusion or pneumothorax is seen. Moderate-sized hiatal hernia. IMPRESSION: 1. No active disease. No evidence of CHF or pneumonia. 2. Stable cardiomegaly. Electronically Signed   By: Franki Cabot M.D.   On: 11/08/2021 17:58

## 2021-11-09 NOTE — Evaluation (Signed)
Physical Therapy Evaluation Patient Details Name: Teresa Murillo MRN: [id number] DOB: May 17, 1960 Today's Date: 11/09/2021  History of Present Illness  Pt is a 61 y/o female presenting with encephalopathy and hallucinations. Work-up consistent with acute encephalopathy, AKI along with A-fib RVR. Imaging showed spinal canal stenosis at C2-C3 with suspected cord compression. PMH includes schizophrenia, COPD, hyperlipidemia, hypertension, diabetes.  Clinical Impression  Pt admitted secondary to problem above with deficits below. Pt requiring min guard to supervision for mobility tasks this session. Noted RLE weakness during gait, but no overt LOB. Impulsive and required safety cues, but likely close to baseline. Recommending HHPT at d/c to address deficits. Will continue to follow acutely.        Recommendations for follow up therapy are one component of a multi-disciplinary discharge planning process, led by the attending physician.  Recommendations may be updated based on patient status, additional functional criteria and insurance authorization.  Follow Up Recommendations Home health PT      Assistance Recommended at Discharge Frequent or constant Supervision/Assistance  Patient can return home with the following  A little help with walking and/or transfers;A little help with bathing/dressing/bathroom;Assist for transportation;Assistance with cooking/housework    Equipment Recommendations None recommended by PT  Recommendations for Other Services       Functional Status Assessment Patient has had a recent decline in their functional status and demonstrates the ability to make significant improvements in function in a reasonable and predictable amount of time.     Precautions / Restrictions Precautions Precautions: Fall Restrictions Weight Bearing Restrictions: No      Mobility  Bed Mobility Overal bed mobility: Needs Assistance Bed Mobility: Supine to Sit, Sit to Supine      Supine to sit: Supervision Sit to supine: Min assist   General bed mobility comments: Min A for RLE assist for return to supine    Transfers Overall transfer level: Needs assistance Equipment used: None Transfers: Sit to/from Stand, Bed to chair/wheelchair/BSC Sit to Stand: Min guard Stand pivot transfers: Min guard         General transfer comment: Min guard for safety and line management to transfer to [facility]. Pt impulsive and requiring cues to wait for therapist    Ambulation/Gait Ambulation/Gait assistance: Min guard Gait Distance (Feet): 100 Feet Assistive device: None Gait Pattern/deviations: Step-through pattern, Decreased stride length Gait velocity: Decreased     General Gait Details: Mild instability in RLE throughout gait and pt reports it felt abnormal, but unable to clarify. Min guard for safety.  Stairs            Wheelchair Mobility    Modified Rankin (Stroke Patients Only)       Balance Overall balance assessment: Needs assistance Sitting-balance support: No upper extremity supported, Feet supported Sitting balance-Leahy Scale: Good     Standing balance support: No upper extremity supported Standing balance-Leahy Scale: Fair                               Pertinent Vitals/Pain Pain Assessment Pain Assessment: No/denies pain    Home Living Family/patient expects to be discharged to:: Private residence Living Arrangements: Group Home Available Help at Discharge: Available PRN/intermittently             Home Equipment: None      Prior Function Prior Level of Function : Needs assist             Mobility Comments: Pt reports she is  independent with ambulation ADLs Comments: staff washes the pt clothes, provide the food.     Hand Dominance        Extremity/Trunk Assessment   Upper Extremity Assessment Upper Extremity Assessment: Defer to OT evaluation    Lower Extremity Assessment Lower Extremity  Assessment: RLE deficits/detail RLE Deficits / Details: Grossly 4/5 throughout. Functional weakness noted during ambulation with mild buckling. Pt reports it "felt funny"    Cervical / Trunk Assessment Cervical / Trunk Assessment: Kyphotic  Communication   Communication: No difficulties  Cognition Arousal/Alertness: Awake/alert Behavior During Therapy: Impulsive Overall Cognitive Status: History of cognitive impairments - at baseline                                 General Comments: Schizophrenia at baseline. Pt impulsive and required safety cues.        General Comments      Exercises     Assessment/Plan    PT Assessment Patient needs continued PT services  PT Problem List Decreased strength;Decreased balance;Decreased mobility;Decreased cognition;Decreased safety awareness;Decreased knowledge of precautions       PT Treatment Interventions DME instruction;Gait training;Functional mobility training;Stair training;Therapeutic activities;Therapeutic exercise;Balance training;Patient/family education    PT Goals (Current goals can be found in the Care Plan section)  Acute Rehab PT Goals Patient Stated Goal: "to get lunch" PT Goal Formulation: With patient Time For Goal Achievement: 11/23/21 Potential to Achieve Goals: Good    Frequency Min 3X/week     Co-evaluation PT/OT/SLP Co-Evaluation/Treatment: Yes Reason for Co-Treatment: For patient/therapist safety;To address functional/ADL transfers PT goals addressed during session: Mobility/safety with mobility;Balance         AM-PAC PT "6 Clicks" Mobility  Outcome Measure Help needed turning from your back to your side while in a flat bed without using bedrails?: A Little Help needed moving from lying on your back to sitting on the side of a flat bed without using bedrails?: A Little Help needed moving to and from a bed to a chair (including a wheelchair)?: A Little Help needed standing up from a chair  using your arms (e.g., wheelchair or bedside chair)?: A Little Help needed to walk in hospital room?: A Little Help needed climbing 3-5 steps with a railing? : A Little 6 Click Score: 18    End of Session Equipment Utilized During Treatment: Gait belt Activity Tolerance: Patient tolerated treatment well Patient left: in bed;with call bell/phone within reach (on stretcher in ED) Nurse Communication: Mobility status PT Visit Diagnosis: Unsteadiness on feet (R26.81);Muscle weakness (generalized) (M62.81)    Time: 4098-1191 PT Time Calculation (min) (ACUTE ONLY): 16 min   Charges:   PT Evaluation $PT Eval Moderate Complexity: 1 Mod          Farley Ly, PT, DPT  Acute Rehabilitation Services  Office: [phone]   Lehman Prom 11/09/2021, 1:36 PM

## 2021-11-09 NOTE — ED Notes (Signed)
Pt is intermittently pulling off cords and removing the purewick. Attempted to reeducate reference the importance of leaving them alone. While attempting to educate pt on this pt is constantly dozing off during the conversation

## 2021-11-09 NOTE — Procedures (Signed)
Patient Name: Teresa Murillo  MRN: [id number]  Epilepsy Attending: Charlsie Quest  Referring Physician/Provider: Leroy Sea, MD  Date: 11/09/2021 Duration: 22.17 mins  Patient history: 61 year old female with altered mental status.  EEG to evaluate for seizure.  Level of alertness: Awake  AEDs during EEG study: None  Technical aspects: This EEG study was done with scalp electrodes positioned according to the 10-20 International system of electrode placement. Electrical activity was reviewed with band pass filter of 1-70Hz , sensitivity of 7 uV/mm, display speed of 58mm/sec with a 60Hz  notched filter applied as appropriate. EEG data were recorded continuously and digitally stored.  Video monitoring was available and reviewed as appropriate.  Description: The posterior dominant rhythm consists of 8 Hz activity of moderate voltage (25-35 uV) seen predominantly in posterior head regions, symmetric and reactive to eye opening and eye closing. EEG showed intermittent generalized 3 to 6 Hz theta-delta slowing. Hyperventilation and photic stimulation were not performed.     ABNORMALITY - Intermittent slow, generalized  IMPRESSION: This study is suggestive of mild diffuse encephalopathy, nonspecific etiology. No seizures or epileptiform discharges were seen throughout the recording.  Alazay Leicht 

## 2021-11-09 NOTE — ED Notes (Signed)
Pt up to bedside commode without issue

## 2021-11-09 NOTE — Evaluation (Signed)
Occupational Therapy Evaluation Patient Details Name: Teresa Murillo MRN: [id number] DOB: Aug 21, 1960 Today's Date: 11/09/2021   History of Present Illness 61 y/o female presenting with encephalopathy and hallucinations. Work-up consistent with acute encephalopathy, AKI along with A-fib RVR. Imaging showed spinal canal stenosis at C2-C3 with suspected cord compression. PMH includes schizophrenia, COPD, hyperlipidemia, hypertension, diabetes.   Clinical Impression   PT admitted with afib with RVR and encephalopathy. Pt currently with functional limitiations due to the deficits listed below (see OT problem list). Pt noted to have R LE weakness and denies this being present prior to admission. Pt with unsafe problem solving demonstrated with BSC transfer Pt will benefit from skilled OT to increase their independence and safety with adls and balance to allow discharge HHOT.       Recommendations for follow up therapy are one component of a multi-disciplinary discharge planning process, led by the attending physician.  Recommendations may be updated based on patient status, additional functional criteria and insurance authorization.   Follow Up Recommendations  Home health OT    Assistance Recommended at Discharge Set up Supervision/Assistance  Patient can return home with the following A little help with walking and/or transfers;A little help with bathing/dressing/bathroom;Direct supervision/assist for medications management    Functional Status Assessment  Patient has had a recent decline in their functional status and demonstrates the ability to make significant improvements in function in a reasonable and predictable amount of time.  Equipment Recommendations  BSC/3in1    Recommendations for Other Services       Precautions / Restrictions Precautions Precautions: Fall Restrictions Weight Bearing Restrictions: No      Mobility Bed Mobility Overal bed mobility: Needs  Assistance Bed Mobility: Supine to Sit, Sit to Supine     Supine to sit: Supervision Sit to supine: Min assist   General bed mobility comments: Min A for RLE assist for return to supine    Transfers Overall transfer level: Needs assistance Equipment used: None Transfers: Sit to/from Stand, Bed to chair/wheelchair/BSC Sit to Stand: Min guard Stand pivot transfers: Min guard         General transfer comment: Min guard for safety and line management to transfer to [facility]. Pt impulsive and requiring cues to wait for therapist      Balance Overall balance assessment: Needs assistance Sitting-balance support: No upper extremity supported, Feet supported Sitting balance-Leahy Scale: Good     Standing balance support: No upper extremity supported Standing balance-Leahy Scale: Fair                             ADL either performed or assessed with clinical judgement   ADL Overall ADL's : Needs assistance/impaired Eating/Feeding: Modified independent   Grooming: Supervision/safety   Upper Body Bathing: Maximal assistance           Lower Body Dressing: Maximal assistance   Toilet Transfer: Supervision/safety Toilet Transfer Details (indicate cue type and reason): pt insisting on sitting on the basin in the 3n1 frame with the lid up. Pt educated this was not the safest method and pt continued with task. Pt voiding bladder on bedside commode without any spillage. Advise use of bucket to prevent pt from this unsafe transfer. Toileting- Clothing Manipulation and Hygiene: Supervision/safety       Functional mobility during ADLs: Min guard General ADL Comments: pt noted to have R LE weakness     Vision         Perception  Praxis      Pertinent Vitals/Pain       Hand Dominance     Extremity/Trunk Assessment Upper Extremity Assessment Upper Extremity Assessment: Defer to OT evaluation   Lower Extremity Assessment Lower Extremity Assessment: RLE  deficits/detail RLE Deficits / Details: Grossly 4/5 throughout. Functional weakness noted during ambulation with mild buckling. Pt reports it "felt funny"   Cervical / Trunk Assessment Cervical / Trunk Assessment: Kyphotic   Communication Communication Communication: No difficulties   Cognition Arousal/Alertness: Awake/alert Behavior During Therapy: Impulsive Overall Cognitive Status: History of cognitive impairments - at baseline                                 General Comments: Schizophrenia at baseline. Pt impulsive and required safety cues.     General Comments       Exercises     Shoulder Instructions      Home Living Family/patient expects to be discharged to:: Private residence Living Arrangements: Group Home Available Help at Discharge: Available PRN/intermittently                         Home Equipment: None   Additional Comments: group home has 5 residents total      Prior Functioning/Environment Prior Level of Function : Needs assist             Mobility Comments: Pt reports she is independent with ambulation ADLs Comments: staff washes the pt clothes, provide the food.        OT Problem List: Decreased activity tolerance;Impaired balance (sitting and/or standing);Decreased safety awareness      OT Treatment/Interventions: Self-care/ADL training;Neuromuscular education;DME and/or AE instruction;Therapeutic activities;Balance training;Patient/family education    OT Goals(Current goals can be found in the care plan section) Acute Rehab OT Goals Patient Stated Goal: to get lunch OT Goal Formulation: With patient Time For Goal Achievement: 11/23/21 Potential to Achieve Goals: Good  OT Frequency: Min 2X/week    Co-evaluation PT/OT/SLP Co-Evaluation/Treatment: Yes Reason for Co-Treatment: Necessary to address cognition/behavior during functional activity;For patient/therapist safety PT goals addressed during session:  Mobility/safety with mobility;Balance OT goals addressed during session: ADL's and self-care;Proper use of Adaptive equipment and DME      AM-PAC OT "6 Clicks" Daily Activity     Outcome Measure Help from another person eating meals?: A Little Help from another person taking care of personal grooming?: A Little Help from another person toileting, which includes using toliet, bedpan, or urinal?: A Little Help from another person bathing (including washing, rinsing, drying)?: A Little Help from another person to put on and taking off regular upper body clothing?: A Little Help from another person to put on and taking off regular lower body clothing?: A Little 6 Click Score: 18   End of Session Nurse Communication: Mobility status;Precautions  Activity Tolerance: Patient tolerated treatment well Patient left: in bed;with call bell/phone within reach  OT Visit Diagnosis: Unsteadiness on feet (R26.81);Muscle weakness (generalized) (M62.81)                Time: 5053-9767 OT Time Calculation (min): 16 min Charges:  OT General Charges $OT Visit: 1 Visit OT Evaluation $OT Eval Moderate Complexity: 1 Mod   Brynn, OTR/L  Acute Rehabilitation Services Office: [phone] .   Mateo Flow 11/09/2021, 2:32 PM

## 2021-11-09 NOTE — Progress Notes (Signed)
EEG complete - results pending 

## 2021-11-09 NOTE — ED Notes (Signed)
Dr Singh at bedside

## 2021-11-09 NOTE — Consult Note (Addendum)
Cardiology Consultation   Patient ID: Teresa Murillo MRN: [id number]; DOB: 1960/03/23  Admit date: 11/08/2021 Date of Consult: 11/09/2021  PCP:  Christen Bame, PA   [facility] Providers Cardiologist: New  Patient Profile:   Teresa Murillo is a 61 y.o. female with a hx of schizophrenia, COPD, hyperlipidemia, hypertension, diabetes, alcohol use, hypothyroidism and anemia who is being seen 11/09/2021 for the evaluation of atrial fibrillation at the request of Dr. Thedore Mins.  History of Present Illness:   Teresa Murillo is a 61 year old female with past medical history noted above.  Does not appear that she has ever been seen by cardiology in the past.  Did undergo an echocardiogram 08/2021 in the setting of pericardial effusion noted on CT angiogram.  Echo showed LVEF of 60 to 65%, no regional wall motion abnormality, grade 1 diastolic dysfunction, normal RV size and function, moderately dilated left atrium, moderate valvular calcification with a moderate circumferential pericardial effusion. Currently resides at a group home.  She was brought to the ED on 9/10 as a staff reported she had been altered and was having hallucinations.  Also reported abdominal pain and weakness.  In the ED her labs showed sodium 138, potassium 4.1, creatinine 1.1, WBC 7.7, hemoglobin 12.3, high-sensitivity troponin 7>>8.  EKG showed sinus tachycardia 118 bpm, biatrial enlargement.  Negative chest x-ray.  While in the ED she developed narrow complex tachycardia with heart rates in the 190s.  ER provider performed vagal maneuver which showed course Afib vs atrial flutter.  She was given IV diltiazem as well as a liter of fluid with improvement.  Admitted to internal medicine for further management.  She was started on Eliquis.  CT head and MRI brain nonacute.   Cardiology now asked to evaluate.  In review of telemetry, she has converted to sinus rhythm this morning and been maintaining. She has no complaints,  eating her lunch tray.   Past Medical History:  Diagnosis Date   Chronic obstructive pulmonary disease (COPD) (HCC)    Glaucoma    Hyperlipidemia    Hypertension    Iron deficiency    Schizoaffective disorder (HCC)    Type 2 diabetes mellitus (HCC)     Past Surgical History:  Procedure Laterality Date   TUBAL LIGATION       Home Medications:  Prior to Admission medications   Medication Sig Start Date End Date Taking? Authorizing Provider  benztropine (COGENTIN) 1 MG tablet Take 1 tablet (1 mg total) by mouth in the morning and at bedtime. 10/22/21  Yes Carlyn Reichert, MD  budesonide-formoterol [facility]) 160-4.5 MCG/ACT inhaler Inhale 2 puffs into the lungs in the morning and at bedtime. 01/08/21  Yes [provider]  Cholecalciferol (VITAMIN D3) 1.25 MG (50000 UT) CAPS Take 50,000 Units by mouth every Thursday.   Yes [provider]  clozapine (CLOZARIL) 50 MG tablet Take 50 mg by mouth 2 (two) times daily.   Yes [provider]  divalproex (DEPAKOTE ER) 500 MG 24 hr tablet Take 1 tablet (500 mg total) by mouth in the morning and at bedtime. 10/22/21  Yes Carlyn Reichert, MD  INGREZZA 40 MG capsule Take 1 capsule (40 mg total) by mouth in the morning. 10/22/21  Yes Carlyn Reichert, MD  latanoprost (XALATAN) 0.005 % ophthalmic solution Place 1 drop into both eyes at bedtime.   Yes [provider]  lisinopril (ZESTRIL) 10 MG tablet Take 10 mg by mouth daily.   Yes [provider]  metFORMIN (GLUCOPHAGE) 500  MG tablet Take 500 mg by mouth 2 (two) times daily with a meal.   Yes [provider]  nicotine (NICODERM CQ - DOSED IN MG/24 HR) 7 mg/24hr patch Place 1 patch (7 mg total) onto the skin daily. 10/22/21  Yes Corky Sox, MD  OLANZapine-Samidorphan (LYBALVI) 20-10 MG TABS Take 1 tablet by mouth at bedtime. 10/22/21  Yes Corky Sox, MD  PROAIR HFA [phone] Base) MCG/ACT inhaler Inhale 2 puffs into the lungs every 6 (six) hours  as needed for wheezing or shortness of breath.   Yes [provider]    Inpatient Medications: Scheduled Meds:  apixaban  5 mg Oral BID   diltiazem  60 mg Oral Q8H   divalproex  500 mg Oral Daily   insulin aspart  0-9 Units Subcutaneous TID WC   lactulose  10 g Oral BID   latanoprost  1 drop Both Eyes QHS   mometasone-formoterol  2 puff Inhalation BID   sodium chloride flush  3 mL Intravenous Q12H   Continuous Infusions:  sodium chloride 75 mL/hr at 11/09/21 1221   PRN Meds: acetaminophen **OR** acetaminophen, albuterol, polyethylene glycol  Allergies:    Allergies  Allergen Reactions   Penicillins Other (See Comments)    Reaction not known at group home    Social History:   Social History   Socioeconomic History   Marital status: Divorced    Spouse name: Not on file   Number of children: Not on file   Years of education: Not on file   Highest education level: Not on file  Occupational History   Not on file  Tobacco Use   Smoking status: Every Day    Packs/day: 1.00    Types: Cigars, Cigarettes   Smokeless tobacco: Current  Vaping Use   Vaping Use: Never used  Substance and Sexual Activity   Alcohol use: Yes   Drug use: Not Currently   Sexual activity: Not Currently    Birth control/protection: Surgical  Other Topics Concern   Not on file  Social History Narrative   Not on file   Social Determinants of Health   Financial Resource Strain: Not on file  Food Insecurity: Not on file  Transportation Needs: Not on file  Physical Activity: Not on file  Stress: Not on file  Social Connections: Not on file  Intimate Partner Violence: Not on file    Family History:    Family History  Problem Relation Age of Onset   Breast cancer Maternal Grandmother      ROS:  Please see the history of present illness.   All other ROS reviewed and negative.     Physical Exam/Data:   Vitals:   11/09/21 1019 11/09/21 1030 11/09/21 1100 11/09/21 1139  BP:   122/70 107/63   Pulse: 87 77 91 96  Resp: 17 16 15    Temp:    97.7 F (36.5 C)  TempSrc:      SpO2: 96% 97% 94% 97%    Intake/Output Summary (Last 24 hours) at 11/09/2021 1349 Last data filed at 11/09/2021 0606 Gross per 24 hour  Intake 1631.83 ml  Output --  Net 1631.83 ml      08/22/2021    9:30 AM 11/01/2020   10:02 AM 06/02/2020   10:16 AM  Last 3 Weights  Weight (lbs) 135 lb 150 lb 153 lb  Weight (kg) 61.236 kg 68.04 kg 69.4 kg     There is no height or weight on file to  calculate BMI.  General:  Well nourished, well developed, in no acute distress HEENT: normal Neck: no JVD Vascular: No carotid bruits; Distal pulses 2+ bilaterally Cardiac:  normal S1, S2; RRR; no murmur  Lungs:  clear to auscultation bilaterally, no wheezing, rhonchi or rales  Abd: soft, nontender, no hepatomegaly  Ext: no edema Musculoskeletal:  No deformities, BUE and BLE strength normal and equal Skin: warm and dry  Neuro:  CNs 2-12 intact, no focal abnormalities noted Psych:  Normal affect   EKG:  The EKG was personally reviewed and demonstrates:   EKG 9/10 1630 showed sinus tachycardia 118 bpm, biatrial enlargement.  9/10 1720 narrow complex tachycardia, 190bpm   9/10 1723 atrial fibrillation 135bpm  Relevant CV Studies:  Echo: 08/2021  IMPRESSIONS     1. Left ventricular ejection fraction, by estimation, is 60 to 65%. The  left ventricle has normal function. The left ventricle has no regional  wall motion abnormalities. There is mild concentric left ventricular  hypertrophy. Left ventricular diastolic  parameters are consistent with Grade I diastolic dysfunction (impaired  relaxation).   2. Right ventricular systolic function is normal. The right ventricular  size is normal. There is normal pulmonary artery systolic pressure. The  estimated right ventricular systolic pressure is 123456 mmHg.   3. Left atrial size was moderately dilated.   4. The mitral valve is degenerative. No  evidence of mitral valve  regurgitation. No evidence of mitral stenosis. Moderate mitral annular  calcification.   5. The aortic valve is tricuspid. There is moderate calcification of the  aortic valve. Aortic valve regurgitation is not visualized. Aortic valve  sclerosis/calcification is present, without any evidence of aortic  stenosis.   6. The inferior vena cava is normal in size with greater than 50%  respiratory variability, suggesting right atrial pressure of 3 mmHg.   7. Moderate pericardial effusion. The pericardial effusion is  circumferential. There does not appear to be tamponade present. < 25%  respirophasic variation of the mitral E inflow velocity, the IVC was not  dilated, no diastolic RV free wall collapse.   FINDINGS   Left Ventricle: Left ventricular ejection fraction, by estimation, is 60  to 65%. The left ventricle has normal function. The left ventricle has no  regional wall motion abnormalities. The left ventricular internal cavity  size was normal in size. There is   mild concentric left ventricular hypertrophy. Left ventricular diastolic  parameters are consistent with Grade I diastolic dysfunction (impaired  relaxation).   Right Ventricle: The right ventricular size is normal. No increase in  right ventricular wall thickness. Right ventricular systolic function is  normal. There is normal pulmonary artery systolic pressure. The tricuspid  regurgitant velocity is 1.85 m/s, and   with an assumed right atrial pressure of 3 mmHg, the estimated right  ventricular systolic pressure is 123456 mmHg.   Left Atrium: Left atrial size was moderately dilated.   Right Atrium: Right atrial size was normal in size.   Pericardium: A moderately sized pericardial effusion is present. The  pericardial effusion is circumferential.   Mitral Valve: The mitral valve is degenerative in appearance. There is  mild calcification of the mitral valve leaflet(s). Moderate mitral  annular  calcification. No evidence of mitral valve regurgitation. No evidence of  mitral valve stenosis.   Tricuspid Valve: The tricuspid valve is normal in structure. Tricuspid  valve regurgitation is trivial.   Aortic Valve: The aortic valve is tricuspid. There is moderate  calcification of the aortic  valve. Aortic valve regurgitation is not  visualized. Aortic valve sclerosis/calcification is present, without any  evidence of aortic stenosis.   Pulmonic Valve: The pulmonic valve was normal in structure. Pulmonic valve  regurgitation is not visualized.   Aorta: The aortic root is normal in size and structure.   Venous: The inferior vena cava is normal in size with greater than 50%  respiratory variability, suggesting right atrial pressure of 3 mmHg.   IAS/Shunts: No atrial level shunt detected by color flow Doppler.   Laboratory Data:  High Sensitivity Troponin:   Recent Labs  Lab 11/08/21 1730 11/08/21 2118  TROPONINIHS 7 8     Chemistry Recent Labs  Lab 11/08/21 1048 11/08/21 1730 11/08/21 1858 11/09/21 0451  NA 138  --  135 133*  K 4.1  --  3.9 4.0  CL 104  --   --  100  CO2 23  --   --  24  GLUCOSE 129*  --   --  110*  BUN 16  --   --  7  CREATININE 1.17*  --   --  0.52  CALCIUM 9.3  --   --  8.5*  MG  --  1.7  --   --   GFRNONAA 54*  --   --  >60  ANIONGAP 11  --   --  9    Recent Labs  Lab 11/08/21 1048 11/09/21 0451  PROT 7.5 6.1*  ALBUMIN 3.7 3.0*  AST 20 15  ALT 15 12  ALKPHOS 65 52  BILITOT 0.5 0.7   Lipids No results for input(s): "CHOL", "TRIG", "HDL", "LABVLDL", "LDLCALC", "CHOLHDL" in the last 168 hours.  Hematology Recent Labs  Lab 11/08/21 1048 11/08/21 1858 11/09/21 0451  WBC 7.7  --  6.9  RBC 4.73  --  4.25  HGB 12.3 12.2 11.2*  HCT 38.2 36.0 34.0*  MCV 80.8  --  80.0  MCH 26.0  --  26.4  MCHC 32.2  --  32.9  RDW 16.7*  --  16.8*  PLT 286  --  255   Thyroid  Recent Labs  Lab 11/08/21 1730  TSH 1.293     BNP Recent Labs  Lab 11/08/21 2118  BNP 21.1    DDimer No results for input(s): "DDIMER" in the last 168 hours.   Radiology/Studies:  MR BRAIN WO CONTRAST  Result Date: 11/09/2021 CLINICAL DATA:  Presents with encephalopathy and hallucinations. History of schizophrenia, alcoholism, anemia. EXAM: MRI HEAD WITHOUT CONTRAST TECHNIQUE: Multiplanar, multiecho pulse sequences of the brain and surrounding structures were obtained without intravenous contrast. COMPARISON:  CT head 1 day prior FINDINGS: Brain: There is no acute intracranial hemorrhage, extra-axial fluid collection, or acute infarct. Parenchymal volume is normal. The ventricles are normal in size. Gray-white differentiation is preserved. There are scattered small foci of FLAIR signal abnormality in the supratentorial white matter likely reflecting sequela of minimal underlying chronic small vessel ischemic change. There is no suspicious parenchymal signal abnormality. There is no mass lesion. There is no mass effect or midline shift. Vascular: Normal flow voids. Skull and upper cervical spine: There is no suspicious marrow signal abnormality. There is apparent moderate spinal canal stenosis at C2-C3 with possible cord compression. Sinuses/Orbits: The paranasal sinuses are clear. The globes and orbits are unremarkable. Other: None. IMPRESSION: 1. No acute intracranial pathology. 2. 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 Consider cervical spine MRI for better evaluation. Electronically Signed   By: Teresa Murillo.D.  On: 11/09/2021 11:15   EEG adult  Result Date: 11/09/2021 Teresa Quest, MD     11/09/2021 11:20 AM Patient Name: Teresa Murillo MRN: [id number] Epilepsy Attending: Charlsie Murillo Referring Physician/Provider: Leroy Sea, MD Date: 11/09/2021 Duration: 22.17 mins Patient history: 61 year old female with altered mental status.  EEG to evaluate for seizure. Level of alertness: Awake AEDs during  EEG study: None Technical aspects: This EEG study was done with scalp electrodes positioned according to the 10-20 International system of electrode placement. Electrical activity was reviewed with band pass filter of 1-70Hz , sensitivity of 7 uV/mm, display speed of 17mm/sec with a 60Hz  notched filter applied as appropriate. EEG data were recorded continuously and digitally stored.  Video monitoring was available and reviewed as appropriate. Description: The posterior dominant rhythm consists of 8 Hz activity of moderate voltage (25-35 uV) seen predominantly in posterior head regions, symmetric and reactive to eye opening and eye closing. EEG showed intermittent generalized 3 to 6 Hz theta-delta slowing. Hyperventilation and photic stimulation were not performed.   ABNORMALITY - Intermittent slow, generalized IMPRESSION: This study is suggestive of mild diffuse encephalopathy, nonspecific etiology. No seizures or epileptiform discharges were seen throughout the recording. Teresa Murillo   CT HEAD WO CONTRAST (Teresa Murillo)  Result Date: 11/08/2021 CLINICAL DATA:  Hallucinations EXAM: CT HEAD WITHOUT CONTRAST TECHNIQUE: Contiguous axial images were obtained from the base of the skull through the vertex without intravenous contrast. RADIATION DOSE REDUCTION: This exam was performed according to the departmental dose-optimization program which includes automated exposure control, adjustment of the mA and/or kV according to patient size and/or use of iterative reconstruction technique. COMPARISON:  09/09/2021 FINDINGS: Brain: No evidence of acute infarction, hemorrhage, hydrocephalus, extra-axial collection or mass lesion/mass effect. Vascular: Mild intracranial atherosclerosis. Skull: Normal. Negative for fracture or focal lesion. Sinuses/Orbits: The visualized paranasal sinuses are essentially clear. The mastoid air cells are unopacified. Other: None.  Normal head CT. IMPRESSION: Normal head CT. Electronically Signed    By: 11/10/2021 M.D.   On: 11/08/2021 19:04   CT ABDOMEN PELVIS W CONTRAST  Result Date: 11/08/2021 CLINICAL DATA:  Abdominal pain EXAM: CT ABDOMEN AND PELVIS WITH CONTRAST TECHNIQUE: Multidetector CT imaging of the abdomen and pelvis was performed using the standard protocol following bolus administration of intravenous contrast. RADIATION DOSE REDUCTION: This exam was performed according to the departmental dose-optimization program which includes automated exposure control, adjustment of the mA and/or kV according to patient size and/or use of iterative reconstruction technique. CONTRAST:  01/08/2022 OMNIPAQUE IOHEXOL 300 MG/ML  SOLN COMPARISON:  None Available. FINDINGS: Lower chest: Minimal scarring/atelectasis the lung bases. Trace right pleural fluid. Hepatobiliary: Liver is within normal limits. Gallbladder is decompressed. No intrahepatic or extrahepatic duct dilatation. Pancreas: Within normal limits. Spleen: Within normal limits Adrenals/Urinary Tract: Adrenal glands are within normal limits. Kidneys are within normal limits. Right extrarenal pelvis. No frank hydronephrosis. Bladder is within normal limits. Stomach/Bowel: Stomach is notable for a large hiatal hernia. No evidence of bowel obstruction. Normal appendix (series 3/image 65). No colonic wall thickening or inflammatory changes. Vascular/Lymphatic: No evidence of abdominal aortic aneurysm. Atherosclerotic calcifications of the abdominal aorta and branch vessels. No suspicious abdominopelvic lymphadenopathy. Reproductive: Uterus is within normal limits. Bilateral ovaries are within normal limits. Other: No abdominopelvic ascites. Musculoskeletal: Mild degenerative changes of the lower thoracic spine. IMPRESSION: No CT findings to account for the patient's abdominal pain. Large hiatal hernia. Electronically Signed   By: M.D.   On: 11/08/2021 19:03  DG Chest Portable 1 View  Result Date: 11/08/2021 CLINICAL DATA:  Altered  mental status, tachycardia, nausea and vomiting. EXAM: PORTABLE CHEST 1 VIEW COMPARISON:  Chest x-ray dated 06/02/2020 FINDINGS: Grossly stable cardiomegaly. No evidence of active CHF. No evidence of pneumonia or pulmonary edema. No pleural effusion or pneumothorax is seen. Moderate-sized hiatal hernia. IMPRESSION: 1. No active disease. No evidence of CHF or pneumonia. 2. Stable cardiomegaly. Electronically Signed   By: Franki Cabot M.D.   On: 11/08/2021 17:58     Assessment and Plan:   Leileen Pande is a 61 y.o. female with a hx of schizophrenia, COPD, hyperlipidemia, hypertension, diabetes, alcohol use, hypothyroidism and anemia who is being seen 11/09/2021 for the evaluation of atrial fibrillation at the request of Dr. Candiss Norse.  New onset atrial fibrillation RVR -- developed while in the ED, given IV Diltiazem with improvement and conversion to SR.  -- has been transitioned to diltiazem 60mg  q8hr, BP is stable therefore consolidate  -- started on Eliquis 5mg  BID on admission  Hypertension -- stable  -- lisinopril 10mg  daily PTA  Moderate pericardial effusion -- noted on echo 08/2021, was suppose to follow up in the office for repeat -- will repeat to ensure no changes especially given the need for [facility]  Per primary Schizophrenia EtOH use Acute encephalopathy  Risk Assessment/Risk Scores:   CHA2DS2-VASc Score = 3   This indicates a 3.2% annual risk of stroke. The patient's score is based upon: CHF History: 0 HTN History: 1 Diabetes History: 1 Stroke History: 0 Vascular Disease History: 0 Age Score: 0 Gender Score: 1 For questions or updates, please contact Alfred Please consult [email or web address] for contact info under  Signed, Reino Bellis, NP  11/09/2021 1:49 PM  Patient seen and examined, note reviewed with the signed Advanced Practice Provider. I personally reviewed laboratory data, imaging studies and relevant notes. I independently examined the patient and  formulated the important aspects of the plan. I have personally discussed the plan with the patient and/or family. Comments or changes to the note/plan are indicated below.  Patient seen and examined at her bedside. She offer no complaints during the time of my arrival.  She is no in sinus rhythm.  Will continue her rate control agents- agree with starting anticoagulation for stroke prevention.  Continue current blood pressure regimen.  Berniece Salines DO, MS [facility] Attending Cardiologist [place] [facility]  [address] [place], [place] [address] [phone] Website: [email or web address]

## 2021-11-09 NOTE — Evaluation (Signed)
Clinical/Bedside Swallow Evaluation Patient Details  Name: Teresa Murillo MRN: [id number] Date of Birth: [birth year]  Today's Date: 11/09/2021 Time: SLP Start Time (ACUTE ONLY): 1000 SLP Stop Time (ACUTE ONLY): 1020 SLP Time Calculation (min) (ACUTE ONLY): 20 min  Past Medical History:  Past Medical History:  Diagnosis Date   Chronic obstructive pulmonary disease (COPD) (HCC)    Glaucoma    Hyperlipidemia    Hypertension    Iron deficiency    Schizoaffective disorder (HCC)    Type 2 diabetes mellitus (HCC)    Past Surgical History:  Past Surgical History:  Procedure Laterality Date   TUBAL LIGATION     HPI:  Patient is a 61 year old female with a history of schizophrenia presenting from facility after issues with acute encephalopathy and hallucinations.   Pt noted to be lethargic she does state that she has trouble staying awake.  Some improvement in the ED and has been perseverating on drinking juice, question of a psych component. Infectious component ruled out with no white count, no nuchal rigidity.  Normal chest x-ray normal CT abdomen pelvis normal CT head.  MRI pending    Assessment / Plan / Recommendation  Clinical Impression  Pt demonstrates no signs of aspiration. Pt able to sit herself up in bed and self feed a [place] sandwich and drink 8 oz of juice. Pt admits that sometimes she chokes on solids because she eats too fast. Pt did seem a bit impulsive, but consistently followed bites with sips to fully clear mouth after loading with two bites. No need to modify or restrict diet. May need good positioning for meals, up to chair is best. Currently her arousal and mentation are adequate for self feeding. No SLP f/u needed. SLP Visit Diagnosis: Dysphagia, unspecified (R13.10);Dysphagia, oral phase (R13.11)    Aspiration Risk  Mild aspiration risk    Diet Recommendation Regular;Thin liquid   Liquid Administration via: Cup;Straw Medication Administration: Whole meds with  liquid Supervision: Patient able to self feed Compensations: Slow rate;Small sips/bites;Minimize environmental distractions Postural Changes: Seated upright at 90 degrees    Other  Recommendations      Recommendations for follow up therapy are one component of a multi-disciplinary discharge planning process, led by the attending physician.  Recommendations may be updated based on patient status, additional functional criteria and insurance authorization.  Follow up Recommendations No SLP follow up      Assistance Recommended at Discharge    Functional Status Assessment    Frequency and Duration            Prognosis        Swallow Study   General HPI: Patient is a 61 year old female with a history of schizophrenia presenting from facility after issues with acute encephalopathy and hallucinations.   Pt noted to be lethargic she does state that she has trouble staying awake.  Some improvement in the ED and has been perseverating on drinking juice, question of a psych component. Infectious component ruled out with no white count, no nuchal rigidity.  Normal chest x-ray normal CT abdomen pelvis normal CT head.  MRI pending Type of Study: Bedside Swallow Evaluation Previous Swallow Assessment: none Diet Prior to this Study: Regular;Thin liquids Temperature Spikes Noted: No Respiratory Status: Room air History of Recent Intubation: No Behavior/Cognition: Alert;Cooperative;Pleasant mood Oral Cavity Assessment: Within Functional Limits Oral Care Completed by SLP: No Oral Cavity - Dentition: Dentures, top;Missing dentition Vision: Functional for self-feeding Self-Feeding Abilities: Able to feed self Patient Positioning:  Upright in bed Baseline Vocal Quality: Normal Volitional Cough: Strong Volitional Swallow: Able to elicit    Oral/Motor/Sensory Function Overall Oral Motor/Sensory Function: Within functional limits   Ice Chips Ice chips: Not tested   Thin Liquid Thin Liquid: Within  functional limits Presentation: Cup;Straw;Self Fed    Nectar Thick Nectar Thick Liquid: Not tested   Honey Thick Honey Thick Liquid: Not tested   Puree Puree: Within functional limits   Solid     Solid: Within functional limits      Inza Mikrut, Riley Nearing 11/09/2021,10:32 AM

## 2021-11-10 ENCOUNTER — Inpatient Hospital Stay (HOSPITAL_COMMUNITY): Payer: Medicaid Other

## 2021-11-10 ENCOUNTER — Other Ambulatory Visit (HOSPITAL_COMMUNITY): Payer: Self-pay

## 2021-11-10 ENCOUNTER — Telehealth (HOSPITAL_COMMUNITY): Payer: Self-pay | Admitting: Pharmacy Technician

## 2021-11-10 DIAGNOSIS — I4892 Unspecified atrial flutter: Secondary | ICD-10-CM | POA: Diagnosis not present

## 2021-11-10 DIAGNOSIS — R4182 Altered mental status, unspecified: Secondary | ICD-10-CM | POA: Diagnosis not present

## 2021-11-10 DIAGNOSIS — R443 Hallucinations, unspecified: Secondary | ICD-10-CM | POA: Diagnosis not present

## 2021-11-10 LAB — COMPREHENSIVE METABOLIC PANEL
ALT: 12 U/L (ref 0–44)
AST: 17 U/L (ref 15–41)
Albumin: 3.2 g/dL — ABNORMAL LOW (ref 3.5–5.0)
Alkaline Phosphatase: 53 U/L (ref 38–126)
Anion gap: 8 (ref 5–15)
BUN: 6 mg/dL (ref 6–20)
CO2: 25 mmol/L (ref 22–32)
Calcium: 8.8 mg/dL — ABNORMAL LOW (ref 8.9–10.3)
Chloride: 102 mmol/L (ref 98–111)
Creatinine, Ser: 0.56 mg/dL (ref 0.44–1.00)
GFR, Estimated: >60 mL/min (ref >60)
Glucose, Bld: 136 mg/dL — ABNORMAL HIGH (ref 70–99)
Potassium: 4.3 mmol/L (ref 3.5–5.1)
Sodium: 135 mmol/L (ref 135–145)
Total Bilirubin: 0.5 mg/dL (ref 0.3–1.2)
Total Protein: 6.3 g/dL — ABNORMAL LOW (ref 6.5–8.1)

## 2021-11-10 LAB — GLUCOSE, CAPILLARY
Glucose-Capillary: 120 mg/dL — ABNORMAL HIGH (ref 70–99)
Glucose-Capillary: 156 mg/dL — ABNORMAL HIGH (ref 70–99)
Glucose-Capillary: 116 mg/dL — ABNORMAL HIGH (ref 70–99)
Glucose-Capillary: 182 mg/dL — ABNORMAL HIGH (ref 70–99)

## 2021-11-10 LAB — CBC WITH DIFFERENTIAL/PLATELET
Abs Immature Granulocytes: 0.03 10*3/uL (ref 0.00–0.07)
Basophils Absolute: 0.1 10*3/uL (ref 0.0–0.1)
Basophils Relative: 1 %
Eosinophils Absolute: 0.2 10*3/uL (ref 0.0–0.5)
Eosinophils Relative: 2 %
HCT: 32 % — ABNORMAL LOW (ref 36.0–46.0)
Hemoglobin: 10.5 g/dL — ABNORMAL LOW (ref 12.0–15.0)
Immature Granulocytes: 1 %
Lymphocytes Relative: 39 %
Lymphs Abs: 2.6 10*3/uL (ref 0.7–4.0)
MCH: 26.1 pg (ref 26.0–34.0)
MCHC: 32.8 g/dL (ref 30.0–36.0)
MCV: 79.4 fL — ABNORMAL LOW (ref 80.0–100.0)
Monocytes Absolute: 0.6 10*3/uL (ref 0.1–1.0)
Monocytes Relative: 9 %
Neutro Abs: 3.2 10*3/uL (ref 1.7–7.7)
Neutrophils Relative %: 48 %
Platelets: 261 10*3/uL (ref 150–400)
RBC: 4.03 MIL/uL (ref 3.87–5.11)
RDW: 16.6 % — ABNORMAL HIGH (ref 11.5–15.5)
WBC: 6.6 10*3/uL (ref 4.0–10.5)
nRBC: 0 % (ref 0.0–0.2)

## 2021-11-10 LAB — HIV ANTIBODY (ROUTINE TESTING W REFLEX): HIV Screen 4th Generation wRfx: NONREACTIVE

## 2021-11-10 LAB — AMMONIA: Ammonia: 28 umol/L (ref 9–35)

## 2021-11-10 LAB — MAGNESIUM: Magnesium: 1.4 mg/dL — ABNORMAL LOW (ref 1.7–2.4)

## 2021-11-10 LAB — BRAIN NATRIURETIC PEPTIDE: B Natriuretic Peptide: 69.3 pg/mL (ref 0.0–100.0)

## 2021-11-10 MED ORDER — VALBENAZINE TOSYLATE 40 MG PO CAPS
40.0000 mg | ORAL_CAPSULE | Freq: Every day | ORAL | Status: DC
  Administered 2021-11-11 – 2021-11-16 (×6): 40 mg via ORAL
  Filled 2021-11-10 (×6): qty 1

## 2021-11-10 MED ORDER — DILTIAZEM HCL ER COATED BEADS 240 MG PO CP24: 240.0000 mg | ORAL_CAPSULE | Freq: Every day | ORAL | Status: DC

## 2021-11-10 MED ORDER — MAGNESIUM SULFATE 4 GM/100ML IV SOLN
4.0000 g | Freq: Once | INTRAVENOUS | Status: AC
  Administered 2021-11-10: 4 g via INTRAVENOUS
  Filled 2021-11-10: qty 100

## 2021-11-10 MED ORDER — LACTULOSE 10 GM/15ML PO SOLN
10.0000 g | Freq: Every day | ORAL | Status: DC
  Administered 2021-11-10 – 2021-11-16 (×7): 10 g via ORAL
  Filled 2021-11-10 (×7): qty 15

## 2021-11-10 MED ORDER — DILTIAZEM HCL ER COATED BEADS 120 MG PO CP24: 120.0000 mg | ORAL_CAPSULE | Freq: Once | ORAL | Status: DC

## 2021-11-10 MED ORDER — DILTIAZEM HCL ER COATED BEADS 180 MG PO CP24
180.0000 mg | ORAL_CAPSULE | Freq: Every day | ORAL | Status: DC
  Administered 2021-11-10: 180 mg via ORAL
  Filled 2021-11-10: qty 1

## 2021-11-10 MED ORDER — DILTIAZEM HCL ER COATED BEADS 180 MG PO CP24
180.0000 mg | ORAL_CAPSULE | Freq: Every day | ORAL | Status: DC
  Administered 2021-11-11 – 2021-11-15 (×5): 180 mg via ORAL
  Filled 2021-11-10 (×5): qty 1

## 2021-11-10 MED ORDER — DILTIAZEM HCL 25 MG/5ML IV SOLN: 10.0000 mg | Freq: Four times a day (QID) | INTRAVENOUS | Status: DC | PRN

## 2021-11-10 MED ORDER — HYDRALAZINE HCL 20 MG/ML IJ SOLN: 10.0000 mg | Freq: Four times a day (QID) | INTRAMUSCULAR | Status: DC | PRN

## 2021-11-10 NOTE — Progress Notes (Signed)
PROGRESS NOTE                                                                                                                                                                                                             Patient Demographics:    Teresa Murillo, is a 61 y.o. female, DOB - [birth year], [id number]  Outpatient Primary MD for the patient is Ileene Hutchinson, [place]    LOS - 1  Admit date - 11/08/2021    Chief Complaint  Patient presents with   Medical Clearance       Brief Narrative (HPI from H&P)  61 y.o. female with medical history significant of schizophrenia, COPD, hyperlipidemia, hypertension, diabetes, alcohol, hypothyroidism, anemia presenting with encephalopathy and hallucinations.  In the ER work-up consistent with acute encephalopathy, AKI along with A-fib RVR.   Subjective:   Patient in bed, appears comfortable, denies any headache, no fever, no chest pain or pressure, no shortness of breath , no abdominal pain. No focal weakness.  Old right foot injury with some discomfort which has been ongoing for several months.   Assessment  & Plan :    Newly diagnosed likely paroxysmal atrial flutter with RVR with [place] vas 2 score of 2 or higher.   Patient placed on IV Cardizem drip currently on oral Cardizem dose adjusted further on 11/10/2021 for better control, cardiology on board, continue Eliquis for now.  TSH stable, echo nonacute.      Acute encephalopathy -  Patient presenting from facility after issues with acute cephalopathy and hallucinations.  She is have history of schizophrenia, EEG, CT head and MRI brain nonacute, CT abdomen pelvis nonacute, no headache or nuchal rigidity, likely mildly dehydrated for which she is getting IV fluids, and by psychiatry this admission Home medications resumed per psychiatry continue to hold LYBALVI she takes at home.  Post discharge will defer to her primary psychiatrist.   Currently encephalopathy seems to have resolved.   AKI -  Elevated 1.17 baseline around 0.5.  Likely due to dehydration hydrate with IV fluids monitor bladder scans.  CT abdomen pelvis nonacute.   Schizophrenia  - per SNF was hallucinating prior to hospital transfer, have consulted psych to assist with schizophrenia medications and management.  COPD - Symbicort with formulary Dulera, continue as needed albuterol   Hypertension  - Hold home antihypertensives  currently on diltiazem.   Glaucoma - Continue home eyedrops   Borderline elevation of ammonia.  Placed on lactulose.    Chronic right foot discomfort after an injury.  Check x-rays.    Diabetes - SSI    Lab Results  Component Value Date   HGBA1C 7.0 (H) 10/04/2021   CBG (last 3)  Recent Labs    11/09/21 1138 11/09/21 1621 11/10/21 0749  GLUCAP 206* 78 120*         Condition - Fair  Family Communication  : None present.  Code Status :  Full  Consults  :  Psych, Cards  PUD Prophylaxis : PPI   Procedures  :     TTE - 1. A small pericardial effusion is present, circumferential with no evidence of mitral valve respirophasic variation of inflow Doppler. Patient did not complete exam: cannot see IVC. In views obtained, study not suggestive of cardiac tamponade.  2. Left ventricular ejection fraction, by estimation, is 60 to 65%. The left ventricle has normal function. Left ventricular endocardial border not optimally defined to evaluate regional wall motion. There is mild concentric left ventricular hypertrophy. Left ventricular diastolic parameters are consistent with Grade I diastolic dysfunction (impaired relaxation).  3. Right ventricular systolic function is normal. The right ventricular size is normal.  4. Left atrial size was mildly dilated.  5. The mitral valve is degenerative. Mild mitral valve regurgitation. Moderate mitral annular calcification.  6. The aortic valve is tricuspid. There is mild calcification of  the aortic valve. Aortic valve regurgitation is not visualized. Aortic valve sclerosis is present, with no evidence of aortic valve stenosis. Comparison(s): Limited study- pericardial effusion has decreased in size.  EEG - Non Acute  CT head and MRI brain.  Both nonacute.  CT scan abdomen pelvis.  Nonacute.      Disposition Plan  :    Status is: Observation  DVT Prophylaxis  :     apixaban (ELIQUIS) tablet 5 mg   Lab Results  Component Value Date   PLT 261 11/10/2021    Diet :  Diet Order             Diet heart healthy/carb modified Room service appropriate? Yes; Fluid consistency: Thin  Diet effective now                    Inpatient Medications  Scheduled Meds:  apixaban  5 mg Oral BID   cloZAPine  50 mg Oral BID   diltiazem  120 mg Oral Once   [START ON 11/11/2021] diltiazem  240 mg Oral Daily   divalproex  500 mg Oral Daily   insulin aspart  0-9 Units Subcutaneous TID WC   lactulose  10 g Oral Daily   latanoprost  1 drop Both Eyes QHS   mometasone-formoterol  2 puff Inhalation BID   sodium chloride flush  3 mL Intravenous Q12H   Continuous Infusions:   PRN Meds:.acetaminophen **OR** acetaminophen, albuterol, guaiFENesin-dextromethorphan, polyethylene glycol  Antibiotics  :    Anti-infectives (From admission, onward)    None        Time Spent in minutes  30   Lala Lund M.D on 11/10/2021 at 9:53 AM  To page go to [email or web address]   [facility] -  Office  [phone]  See all Orders from today for further details    Objective:   Vitals:   11/10/21 0019 11/10/21 0559 11/10/21 0749 11/10/21 0854  BP: (!) 106/55 114/70  (!) 138/104  Pulse: 89 86    Resp: 16 16    Temp: 97.7 F (36.5 C) 98.2 F (36.8 C)    TempSrc: Oral Oral    SpO2:  100% 97%     Wt Readings from Last 3 Encounters:  08/22/21 61.2 kg  11/01/20 68 kg  06/02/20 69.4 kg     Intake/Output Summary (Last 24 hours) at 11/10/2021 0953 Last data filed at  11/10/2021 0612 Gross per 24 hour  Intake 1766.85 ml  Output --  Net 1766.85 ml     Physical Exam  Awake Alert, No new F.N deficits, Normal affect [place].AT,PERRAL Supple Neck, No JVD,   Symmetrical Chest wall movement, Good air movement bilaterally, CTAB RRR,No Gallops, Rubs or new Murmurs,  +ve B.Sounds, Abd Soft, No tenderness,   No Cyanosis, Clubbing or edema     Data Review:    CBC Recent Labs  Lab 11/08/21 1048 11/08/21 1858 11/09/21 0451 11/10/21 0421  WBC 7.7  --  6.9 6.6  HGB 12.3 12.2 11.2* 10.5*  HCT 38.2 36.0 34.0* 32.0*  PLT 286  --  255 261  MCV 80.8  --  80.0 79.4*  MCH 26.0  --  26.4 26.1  MCHC 32.2  --  32.9 32.8  RDW 16.7*  --  16.8* 16.6*  LYMPHSABS 1.2  --   --  2.6  MONOABS 0.7  --   --  0.6  EOSABS 0.1  --   --  0.2  BASOSABS 0.0  --   --  0.1    Electrolytes Recent Labs  Lab 11/08/21 1048 11/08/21 1718 11/08/21 1730 11/08/21 1858 11/08/21 2118 11/09/21 0451 11/10/21 0421  NA 138  --   --  135  --  133* 135  K 4.1  --   --  3.9  --  4.0 4.3  CL 104  --   --   --   --  100 102  CO2 23  --   --   --   --  24 25  GLUCOSE 129*  --   --   --   --  110* 136*  BUN 16  --   --   --   --  7 6  CREATININE 1.17*  --   --   --   --  0.52 0.56  CALCIUM 9.3  --   --   --   --  8.5* 8.8*  AST 20  --   --   --   --  15 17  ALT 15  --   --   --   --  12 12  ALKPHOS 65  --   --   --   --  52 53  BILITOT 0.5  --   --   --   --  0.7 0.5  ALBUMIN 3.7  --   --   --   --  3.0* 3.2*  MG  --   --  1.7  --   --   --  1.4*  TSH  --   --  1.293  --   --   --   --   AMMONIA  --  49*  --   --   --   --  28  BNP  --   --   --   --  21.1  --  69.3    ------------------------------------------------------------------------------------------------------------------ No results for input(s): "CHOL", "HDL", "LDLCALC", "TRIG", "CHOLHDL", "LDLDIRECT" in the last 72 hours.  Lab Results  Component Value Date   HGBA1C 7.0 (H) 10/04/2021    Recent Labs     11/08/21 1730  TSH 1.293   ------------------------------------------------------------------------------------------------------------------ ID Labs Recent Labs  Lab 11/08/21 1048 11/09/21 0451 11/10/21 0421  WBC 7.7 6.9 6.6  PLT 286 255 261  CREATININE 1.17* 0.52 0.56   Cardiac Enzymes No results for input(s): "CKMB", "TROPONINI", "MYOGLOBIN" in the last 168 hours.  Invalid input(s): "CK"   Radiology Reports ECHOCARDIOGRAM COMPLETE  Result Date: 11/09/2021    ECHOCARDIOGRAM REPORT   Patient Name:   SATOYA FEELEY Date of Exam: 11/09/2021 Medical Rec #:  [id number]       Height:       65.0 in Accession #:    [id number]      Weight:       135.0 lb Date of Birth:  01-07-1961      BSA:          1.674 m Patient Age:    60 years        BP:           107/63 mmHg Patient Gender: F               HR:           79 bpm. Exam Location:  Inpatient Procedure: 2D Echo, Cardiac Doppler and Color Doppler Indications:    Pericardial Effusion I31.3  History:        Patient has prior history of Echocardiogram examinations, most                 recent 09/10/2021. COPD, Arrythmias:Atrial Flutter; Risk                 Factors:Hypertension, Diabetes and Dyslipidemia.  Sonographer:    Lucendia Herrlich Referring Phys: [phone] LINDSAY B ROBERTS IMPRESSIONS  1. A small pericardial effusion is present, circumferential with no evidence of mitral valve respirophasic variation of inflow Doppler. Patient did not complete exam: cannot see IVC. In views obtained, study not suggestive of cardiac tamponade.  2. Left ventricular ejection fraction, by estimation, is 60 to 65%. The left ventricle has normal function. Left ventricular endocardial border not optimally defined to evaluate regional wall motion. There is mild concentric left ventricular hypertrophy. Left ventricular diastolic parameters are consistent with Grade I diastolic dysfunction (impaired relaxation).  3. Right ventricular systolic function is normal. The right  ventricular size is normal.  4. Left atrial size was mildly dilated.  5. The mitral valve is degenerative. Mild mitral valve regurgitation. Moderate mitral annular calcification.  6. The aortic valve is tricuspid. There is mild calcification of the aortic valve. Aortic valve regurgitation is not visualized. Aortic valve sclerosis is present, with no evidence of aortic valve stenosis. Comparison(s): Limited study- pericardial effusion has decreased in size. FINDINGS  Left Ventricle: Left ventricular ejection fraction, by estimation, is 60 to 65%. The left ventricle has normal function. Left ventricular endocardial border not optimally defined to evaluate regional wall motion. The left ventricular internal cavity size was normal in size. There is mild concentric left ventricular hypertrophy. Left ventricular diastolic parameters are consistent with Grade I diastolic dysfunction (impaired relaxation). Right Ventricle: The right ventricular size is normal. No increase in right ventricular wall thickness. Right ventricular systolic function is normal. Left Atrium: Left atrial size was mildly dilated. Right Atrium: Right atrial size was normal in size. Pericardium: A small pericardial effusion is present. Mitral Valve: The mitral valve is degenerative in appearance. Moderate mitral annular  calcification. Mild mitral valve regurgitation. Tricuspid Valve: The tricuspid valve is normal in structure. Tricuspid valve regurgitation is trivial. No evidence of tricuspid stenosis. Aortic Valve: The aortic valve is tricuspid. There is mild calcification of the aortic valve. Aortic valve regurgitation is not visualized. Aortic valve sclerosis is present, with no evidence of aortic valve stenosis. Pulmonic Valve: The pulmonic valve was normal in structure. Pulmonic valve regurgitation is mild. No evidence of pulmonic stenosis. Aorta: The aortic root and ascending aorta are structurally normal, with no evidence of dilitation.  IAS/Shunts: No atrial level shunt detected by color flow Doppler.  LEFT VENTRICLE PLAX 2D LVIDd:         3.80 cm   Diastology LVIDs:         2.50 cm   LV e' lateral:   5.44 cm/s LV PW:         1.20 cm   LV E/e' lateral: 21.7 LV IVS:        1.20 cm LVOT diam:     2.00 cm LV SV:         75 LV SV Index:   45 LVOT Area:     3.14 cm  RIGHT VENTRICLE RV S prime:     14.60 cm/s TAPSE (M-mode): 1.6 cm LEFT ATRIUM           Index        RIGHT ATRIUM           Index LA diam:      3.60 cm 2.15 cm/m   RA Area:     12.70 cm LA Vol (A4C): 57.1 ml 34.11 ml/m  RA Volume:   27.90 ml  16.67 ml/m  AORTIC VALVE             PULMONIC VALVE LVOT Vmax:   123.00 cm/s PR End Diast Vel: 6.86 msec LVOT Vmean:  82.500 cm/s LVOT VTI:    0.238 m  AORTA Ao Root diam: 3.10 cm Ao Asc diam:  3.40 cm MITRAL VALVE                TRICUSPID VALVE MV Area (PHT): 3.12 cm     TR Peak grad:   15.2 mmHg MV Decel Time: 243 msec     TR Vmax:        195.00 cm/s MV E velocity: 118.00 cm/s MV A velocity: 185.00 cm/s  SHUNTS MV E/A ratio:  0.64         Systemic VTI:  0.24 m                             Systemic Diam: 2.00 cm Rudean Haskell MD Electronically signed by Rudean Haskell MD Signature Date/Time: 11/09/2021/4:44:14 PM    Final    MR BRAIN WO CONTRAST  Result Date: 11/09/2021 CLINICAL DATA:  Presents with encephalopathy and hallucinations. History of schizophrenia, alcoholism, anemia. EXAM: MRI HEAD WITHOUT CONTRAST TECHNIQUE: Multiplanar, multiecho pulse sequences of the brain and surrounding structures were obtained without intravenous contrast. COMPARISON:  CT head 1 day prior FINDINGS: Brain: There is no acute intracranial hemorrhage, extra-axial fluid collection, or acute infarct. Parenchymal volume is normal. The ventricles are normal in size. Gray-white differentiation is preserved. There are scattered small foci of FLAIR signal abnormality in the supratentorial white matter likely reflecting sequela of minimal underlying chronic  small vessel ischemic change. There is no suspicious parenchymal signal abnormality. There is no mass lesion. There is no mass  effect or midline shift. Vascular: Normal flow voids. Skull and upper cervical spine: There is no suspicious marrow signal abnormality. There is apparent moderate spinal canal stenosis at C2-C3 with possible cord compression. Sinuses/Orbits: The paranasal sinuses are clear. The globes and orbits are unremarkable. Other: None. IMPRESSION: 1. No acute intracranial pathology. 2. 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 Consider cervical spine MRI for better evaluation. Electronically Signed   By: Valetta Mole M.D.   On: 11/09/2021 11:15   EEG adult  Result Date: 11/09/2021 Lora Havens, MD     11/09/2021 11:20 AM Patient Name: Tanah Drysdale MRN: [id number] Epilepsy Attending: Lora Havens Referring Physician/Provider: Thurnell Lose, MD Date: 11/09/2021 Duration: 22.17 mins Patient history: 61 year old female with altered mental status.  EEG to evaluate for seizure. Level of alertness: Awake AEDs during EEG study: None Technical aspects: This EEG study was done with scalp electrodes positioned according to the 10-20 International system of electrode placement. Electrical activity was reviewed with band pass filter of 1-70Hz , sensitivity of 7 uV/mm, display speed of 67mm/sec with a 60Hz  notched filter applied as appropriate. EEG data were recorded continuously and digitally stored.  Video monitoring was available and reviewed as appropriate. Description: The posterior dominant rhythm consists of 8 Hz activity of moderate voltage (25-35 uV) seen predominantly in posterior head regions, symmetric and reactive to eye opening and eye closing. EEG showed intermittent generalized 3 to 6 Hz theta-delta slowing. Hyperventilation and photic stimulation were not performed.   ABNORMALITY - Intermittent slow, generalized IMPRESSION: This study is suggestive of mild  diffuse encephalopathy, nonspecific etiology. No seizures or epileptiform discharges were seen throughout the recording. Priyanka Barbra Sarks   CT HEAD WO CONTRAST (5MM)  Result Date: 11/08/2021 CLINICAL DATA:  Hallucinations EXAM: CT HEAD WITHOUT CONTRAST TECHNIQUE: Contiguous axial images were obtained from the base of the skull through the vertex without intravenous contrast. RADIATION DOSE REDUCTION: This exam was performed according to the departmental dose-optimization program which includes automated exposure control, adjustment of the mA and/or kV according to patient size and/or use of iterative reconstruction technique. COMPARISON:  09/09/2021 FINDINGS: Brain: No evidence of acute infarction, hemorrhage, hydrocephalus, extra-axial collection or mass lesion/mass effect. Vascular: Mild intracranial atherosclerosis. Skull: Normal. Negative for fracture or focal lesion. Sinuses/Orbits: The visualized paranasal sinuses are essentially clear. The mastoid air cells are unopacified. Other: None.  Normal head CT. IMPRESSION: Normal head CT. Electronically Signed   By: Julian Hy M.D.   On: 11/08/2021 19:04   CT ABDOMEN PELVIS W CONTRAST  Result Date: 11/08/2021 CLINICAL DATA:  Abdominal pain EXAM: CT ABDOMEN AND PELVIS WITH CONTRAST TECHNIQUE: Multidetector CT imaging of the abdomen and pelvis was performed using the standard protocol following bolus administration of intravenous contrast. RADIATION DOSE REDUCTION: This exam was performed according to the departmental dose-optimization program which includes automated exposure control, adjustment of the mA and/or kV according to patient size and/or use of iterative reconstruction technique. CONTRAST:  146mL OMNIPAQUE IOHEXOL 300 MG/ML  SOLN COMPARISON:  None Available. FINDINGS: Lower chest: Minimal scarring/atelectasis the lung bases. Trace right pleural fluid. Hepatobiliary: Liver is within normal limits. Gallbladder is decompressed. No intrahepatic or  extrahepatic duct dilatation. Pancreas: Within normal limits. Spleen: Within normal limits Adrenals/Urinary Tract: Adrenal glands are within normal limits. Kidneys are within normal limits. Right extrarenal pelvis. No frank hydronephrosis. Bladder is within normal limits. Stomach/Bowel: Stomach is notable for a large hiatal hernia. No evidence of bowel obstruction. Normal appendix (series 3/image 65).  No colonic wall thickening or inflammatory changes. Vascular/Lymphatic: No evidence of abdominal aortic aneurysm. Atherosclerotic calcifications of the abdominal aorta and branch vessels. No suspicious abdominopelvic lymphadenopathy. Reproductive: Uterus is within normal limits. Bilateral ovaries are within normal limits. Other: No abdominopelvic ascites. Musculoskeletal: Mild degenerative changes of the lower thoracic spine. IMPRESSION: No CT findings to account for the patient's abdominal pain. Large hiatal hernia. Electronically Signed   By: Charline Bills M.D.   On: 11/08/2021 19:03   DG Chest Portable 1 View  Result Date: 11/08/2021 CLINICAL DATA:  Altered mental status, tachycardia, nausea and vomiting. EXAM: PORTABLE CHEST 1 VIEW COMPARISON:  Chest x-ray dated 06/02/2020 FINDINGS: Grossly stable cardiomegaly. No evidence of active CHF. No evidence of pneumonia or pulmonary edema. No pleural effusion or pneumothorax is seen. Moderate-sized hiatal hernia. IMPRESSION: 1. No active disease. No evidence of CHF or pneumonia. 2. Stable cardiomegaly. Electronically Signed   By: Bary Richard M.D.   On: 11/08/2021 17:58

## 2021-11-10 NOTE — TOC Initial Note (Addendum)
Transition of Care [facility]) - Initial/Assessment Note    Patient Details  Name: Teresa Murillo MRN: [id number] Date of Birth: 04/08/1960  [facility]) CM/SW Contact:    Delilah Shan, LCSWA Phone Number: 11/10/2021, 4:41 PM  Clinical Narrative:         Update- CSW spoke with owner of [facility]. [name] reports that patient has been discharged from Group Home and not able to return. Clarisa reports she has reached out to Care manager through [facility] to assist with finding placement for patient. [name] confirmed she will follow up with CSW tomorrow. CSW informed RN and MD.CSW will continue to follow.           CSW spoke with patient regarding dc plan. Patient reports she comes from Group [facility]. Patient reports her plan is to return to group home at dc. Patient gave CSW permission to call [name] regarding her dc plan. Patient reports she will need transportation when medically ready for dc. CSW called [name] and LVM. CSW awaiting callback. Patient requested clothes from CSW. CSW provided patient with shirt,pants,and shoes from the social work Scientific laboratory technician. Patient accepted and thanked CSW.CSW will continue to follow and assist with patients dc planning needs.   Expected Discharge Plan: Group Home ([facility]) Barriers to Discharge: Continued Medical Work up   Patient Goals and CMS Choice   CMS Medicare.gov Compare Post Acute Care list provided to:: Patient Choice offered to / list presented to : Patient  Expected Discharge Plan and Services Expected Discharge Plan: Group Home ([facility]) In-house Referral: Clinical Social Work     Living arrangements for the past 2 months: Group Home                                      Prior Living Arrangements/Services Living arrangements for the past 2 months: Group Home Lives with::  (Group home) Patient language and need for interpreter  reviewed:: Yes Do you feel safe going back to the place where you live?: Yes      Need for Family Participation in Patient Care: Yes (Comment) Care giver support system in place?: Yes (comment)   Criminal Activity/Legal Involvement Pertinent to Current Situation/Hospitalization: No - Comment as needed  Activities of Daily Living      Permission Sought/Granted Permission sought to share information with : Case Manager, Family Supports, Magazine features editor Permission granted to share information with : Yes, Verbal Permission Granted  Share Information with NAME: [name]  Permission granted to share info w AGENCY: Group Home  Permission granted to share info w Relationship: friend  Permission granted to share info w Contact Information: Josiah Lobo [phone]  Emotional Assessment Appearance:: Appears stated age Attitude/Demeanor/Rapport: Gracious Affect (typically observed): Calm Orientation: : Oriented to Self, Oriented to Place, Oriented to  Time, Oriented to Situation Alcohol / Substance Use: Not Applicable Psych Involvement: No (comment)  Admission diagnosis:  Somnolence [R40.0] Hyperammonemia (HCC) [E72.20] Atrial flutter with rapid ventricular response (HCC) [I48.92] Acute respiratory failure with hypoxia (HCC) [J96.01] Atrial fibrillation with RVR (HCC) [I48.91] Abdominal pain, unspecified abdominal location [R10.9] Altered mental status, unspecified altered mental status type [R41.82] Patient Active Problem List   Diagnosis Date Noted   Hypothyroidism 11/08/2021   Atrial flutter with rapid ventricular response (HCC) 11/08/2021   Acute encephalopathy 11/08/2021   AKI (acute kidney injury) (HCC) 11/08/2021  Aggressive behavior 10/21/2020   COPD (chronic obstructive pulmonary disease) (HCC) 06/02/2020   Broken ankle 06/02/2020   Glaucoma 06/06/2018   Microcytic anemia 06/05/2018   Schizophrenia (HCC) 04/24/2015   Dyslipidemia 04/24/2015   Essential  hypertension 12/15/2014   Type 2 diabetes mellitus without complications (HCC) 12/15/2014   PCP:  Christen Bame, PA Pharmacy:   [facility] [phone] - [place], [place] - [address] AT [facility] [address] [place] [address] Phone: [phone] Fax: [phone]  [facility] [place], [place] - [address] [place] [address] Phone: [phone] Fax: [phone]  [facility] [address] [place] [address] Phone: [phone] Fax: [phone]     Social Determinants of Health (SDOH) Interventions    Readmission Risk Interventions     No data to display

## 2021-11-10 NOTE — TOC Benefit Eligibility Note (Signed)
Patient Product/process development scientist completed.    The patient is currently admitted and upon discharge could be taking Eliquis 5 mg.  The current 30 day co-pay is $4.00.   The patient is currently admitted and upon discharge could be taking Xarelto 20 mg.  The current 30 day co-pay is $4.00.   The patient is insured through [facility]     Roland Earl, CPhT Pharmacy Patient Advocate Specialist [facility] Patient Advocate Team Direct Number: [phone]  Fax: [phone]

## 2021-11-10 NOTE — Progress Notes (Signed)
Physical Therapy Treatment Patient Details Name: Teresa Murillo MRN: [id number] DOB: 05-Jul-1960 Today's Date: 11/10/2021   History of Present Illness Pt is a 61 y.o. female presenting from her group home on 11/08/21 with AMS, hallucinations. Workup for acute encephalopathy, AKI, A-fib RVR. Imaging showed spinal canal stenosis at C2-C3 with suspected cord compression. PMH includes schizophrenia, COPD, HTN, HLD, DM.   PT Comments    Pt progressing with mobility. Pt ambulating and performing ADLs at supervision-level; no overt instability or LOB observed with higher level balance tasks. Pt pleasant and cooperative during session, although repeatedly asking for money to go to vending machine for snacks, ultimately able to be redirected. Pt has met short-term acute PT goals, will have necessary level of supervision at group home. No further acute PT needs identified, will sign off.     Recommendations for follow up therapy are one component of a multi-disciplinary discharge planning process, led by the attending physician.  Recommendations may be updated based on patient status, additional functional criteria and insurance authorization.  Follow Up Recommendations  No PT follow up     Assistance Recommended at Discharge Frequent or constant Supervision/Assistance  Patient can return home with the following A little help with bathing/dressing/bathroom;Assistance with cooking/housework;Assist for transportation;Direct supervision/assist for financial management;Direct supervision/assist for medications management   Equipment Recommendations  None recommended by PT    Recommendations for Other Services       Precautions / Restrictions Precautions Precautions: None Restrictions Weight Bearing Restrictions: No     Mobility  Bed Mobility Overal bed mobility: Independent Bed Mobility: Supine to Sit, Sit to Supine                Transfers Overall transfer level:  Independent Equipment used: None Transfers: Sit to/from Stand                  Ambulation/Gait Ambulation/Gait assistance: Scientist, forensic (Feet): 400 Feet Assistive device: None Gait Pattern/deviations: Step-through pattern, Decreased stride length Gait velocity: Decreased     General Gait Details: slow, steady gait without DME; pt reports h/o R ankle pain from "falling on it." no overt instability or LOB noted. supervision for safety due to h/o AMS   Stairs             Wheelchair Mobility    Modified Rankin (Stroke Patients Only)       Balance Overall balance assessment: Needs assistance Sitting-balance support: No upper extremity supported, Feet supported Sitting balance-Leahy Scale: Good     Standing balance support: No upper extremity supported Standing balance-Leahy Scale: Good               High level balance activites: Side stepping, Direction changes, Turns, Sudden stops, Head turns High Level Balance Comments: no overt instability or LOB noted with higher level balance tasks            Cognition Arousal/Alertness: Awake/alert Behavior During Therapy: WFL for tasks assessed/performed Overall Cognitive Status: History of cognitive impairments - at baseline                                 General Comments: h/o schizophrenia; repetitive in speech and requests, able to be redirected        Exercises      General Comments        Pertinent Vitals/Pain Pain Assessment Pain Assessment: No/denies pain    Home Living  Prior Function            PT Goals (current goals can now be found in the care plan section) Acute Rehab PT Goals PT Goal Formulation: All assessment and education complete, DC therapy Progress towards PT goals: Goals met/education completed, patient discharged from PT    Frequency    Min 3X/week      PT Plan Discharge plan needs to be updated     Co-evaluation              AM-PAC PT "6 Clicks" Mobility   Outcome Measure  Help needed turning from your back to your side while in a flat bed without using bedrails?: None Help needed moving from lying on your back to sitting on the side of a flat bed without using bedrails?: None Help needed moving to and from a bed to a chair (including a wheelchair)?: None Help needed standing up from a chair using your arms (e.g., wheelchair or bedside chair)?: None Help needed to walk in hospital room?: A Little Help needed climbing 3-5 steps with a railing? : A Little 6 Click Score: 22    End of Session   Activity Tolerance: Patient tolerated treatment well Patient left: in bed;with call bell/phone within reach Nurse Communication: Mobility status PT Visit Diagnosis: Unsteadiness on feet (R26.81);Muscle weakness (generalized) (M62.81)     Time: 3406-8403 PT Time Calculation (min) (ACUTE ONLY): 10 min  Charges:  $Gait Training: 8-22 mins                     Mabeline Caras, PT, DPT Acute Rehabilitation Services  Personal: [facility] Office: [place] 11/10/2021, 12:56 PM

## 2021-11-10 NOTE — Discharge Instructions (Addendum)
Follow with Primary MD Christen Bame, PA in 7 days   Get CBC, CMP, magnesium, 2 view Chest X ray -  checked next visit within 1 week by Primary MD    Activity: As tolerated with Full fall precautions use walker/cane & assistance as needed  Disposition monitored facility  Diet: Heart Healthy & Low carbohydrate diet.  Check CBGs q. ACH S  Accuchecks 4 times/day, Once in AM empty stomach and then before each meal. Log in all results and show them to your Prim.MD in 3 days. If any glucose reading is under 80 or above 300 call your Prim MD immidiately. Follow Low glucose instructions for glucose under 80 as instructed.  Special Instructions: If you have smoked or chewed Tobacco  in the last 2 yrs please stop smoking, stop any regular Alcohol  and or any Recreational drug use.  On your next visit with your primary care physician please Get Medicines reviewed and adjusted.  Please request your Prim.MD to go over all Hospital Tests and Procedure/Radiological results at the follow up, please get all Hospital records sent to your Prim MD by signing hospital release before you go home.  If you experience worsening of your admission symptoms, develop shortness of breath, life threatening emergency, suicidal or homicidal thoughts you must seek medical attention immediately by calling 911 or calling your MD immediately  if symptoms less severe.  You Must read complete instructions/literature along with all the possible adverse reactions/side effects for all the Medicines you take and that have been prescribed to you. Take any new Medicines after you have completely understood and accpet all the possible adverse reactions/side effects.       Information on my medicine - ELIQUIS (apixaban)  Why was Eliquis prescribed for you? Eliquis was prescribed for you to reduce the risk of a blood clot forming that can cause a stroke if you have a medical condition called atrial fibrillation (a type of irregular  heartbeat).  What do You need to know about Eliquis ? Take your Eliquis TWICE DAILY - one tablet in the morning and one tablet in the evening with or without food. If you have difficulty swallowing the tablet whole please discuss with your pharmacist how to take the medication safely.  Take Eliquis exactly as prescribed by your doctor and DO NOT stop taking Eliquis without talking to the doctor who prescribed the medication.  Stopping may increase your risk of developing a stroke.  Refill your prescription before you run out.  After discharge, you should have regular check-up appointments with your healthcare provider that is prescribing your Eliquis.  In the future your dose may need to be changed if your kidney function or weight changes by a significant amount or as you get older.  What do you do if you miss a dose? If you miss a dose, take it as soon as you remember on the same day and resume taking twice daily.  Do not take more than one dose of ELIQUIS at the same time to make up a missed dose.  Important Safety Information A possible side effect of Eliquis is bleeding. You should call your healthcare provider right away if you experience any of the following: Bleeding from an injury or your nose that does not stop. Unusual colored urine (red or dark brown) or unusual colored stools (red or black). Unusual bruising for unknown reasons. A serious fall or if you hit your head (even if there is no bleeding).  Some medicines  may interact with Eliquis and might increase your risk of bleeding or clotting while on Eliquis. To help avoid this, consult your healthcare provider or pharmacist prior to using any new prescription or non-prescription medications, including herbals, vitamins, non-steroidal anti-inflammatory drugs (NSAIDs) and supplements.  This website has more information on Eliquis (apixaban): http://[email or web address].com/eliquis/home

## 2021-11-10 NOTE — Progress Notes (Addendum)
Rounding Note    Patient Name: Teresa Murillo Date of Encounter: 11/10/2021  [facility] Cardiologist: Thomasene Ripple, DO new  Subjective   No sx, no palps, tolerated Dilt well  Inpatient Medications    Scheduled Meds:  apixaban  5 mg Oral BID   cloZAPine  50 mg Oral BID   diltiazem  60 mg Oral Q8H   divalproex  500 mg Oral Daily   insulin aspart  0-9 Units Subcutaneous TID WC   lactulose  10 g Oral Daily   latanoprost  1 drop Both Eyes QHS   mometasone-formoterol  2 puff Inhalation BID   sodium chloride flush  3 mL Intravenous Q12H   Continuous Infusions:  magnesium sulfate bolus IVPB 4 g (11/10/21 0611)   PRN Meds: acetaminophen **OR** acetaminophen, albuterol, guaiFENesin-dextromethorphan, polyethylene glycol   Vital Signs    Vitals:   11/09/21 2105 11/10/21 0019 11/10/21 0559 11/10/21 0749  BP: (!) 116/56 (!) 106/55 114/70   Pulse: 95 89 86   Resp: 16 16 16    Temp: 97.7 F (36.5 C) 97.7 F (36.5 C) 98.2 F (36.8 C)   TempSrc: Oral Oral Oral   SpO2:   100% 97%    Intake/Output Summary (Last 24 hours) at 11/10/2021 0805 Last data filed at 11/10/2021 0612 Gross per 24 hour  Intake 1766.85 ml  Output --  Net 1766.85 ml      08/22/2021    9:30 AM 11/01/2020   10:02 AM 06/02/2020   10:16 AM  Last 3 Weights  Weight (lbs) 135 lb 150 lb 153 lb  Weight (kg) 61.236 kg 68.04 kg 69.4 kg      Telemetry    SR - Personally Reviewed  ECG    None today - Personally Reviewed  Physical Exam   GEN: No acute distress.   Neck: No JVD Cardiac: RRR, 2/6 murmur, no rubs, or gallops.  Respiratory: Clear to auscultation bilaterally. GI: Soft, nontender, non-distended  MS: No edema; No deformity. Neuro:  Nonfocal  Psych: Normal affect   Labs    High Sensitivity Troponin:   Recent Labs  Lab 11/08/21 1730 11/08/21 2118  TROPONINIHS 7 8     Chemistry Recent Labs  Lab 11/08/21 1048 11/08/21 1730 11/08/21 1858 11/09/21 0451 11/10/21 0421  NA  138  --  135 133* 135  K 4.1  --  3.9 4.0 4.3  CL 104  --   --  100 102  CO2 23  --   --  24 25  GLUCOSE 129*  --   --  110* 136*  BUN 16  --   --  7 6  CREATININE 1.17*  --   --  0.52 0.56  CALCIUM 9.3  --   --  8.5* 8.8*  MG  --  1.7  --   --  1.4*  PROT 7.5  --   --  6.1* 6.3*  ALBUMIN 3.7  --   --  3.0* 3.2*  AST 20  --   --  15 17  ALT 15  --   --  12 12  ALKPHOS 65  --   --  52 53  BILITOT 0.5  --   --  0.7 0.5  GFRNONAA 54*  --   --  >60 >60  ANIONGAP 11  --   --  9 8    Lipids No results for input(s): "CHOL", "TRIG", "HDL", "LABVLDL", "LDLCALC", "CHOLHDL" in the last 168 hours.  Hematology Recent  Labs  Lab 11/08/21 1048 11/08/21 1858 11/09/21 0451 11/10/21 0421  WBC 7.7  --  6.9 6.6  RBC 4.73  --  4.25 4.03  HGB 12.3 12.2 11.2* 10.5*  HCT 38.2 36.0 34.0* 32.0*  MCV 80.8  --  80.0 79.4*  MCH 26.0  --  26.4 26.1  MCHC 32.2  --  32.9 32.8  RDW 16.7*  --  16.8* 16.6*  PLT 286  --  255 261   Thyroid  Recent Labs  Lab 11/08/21 1730  TSH 1.293    BNP Recent Labs  Lab 11/08/21 2118 11/10/21 0421  BNP 21.1 69.3    DDimer No results for input(s): "DDIMER" in the last 168 hours.   Radiology    ECHOCARDIOGRAM COMPLETE  Result Date: 11/09/2021    ECHOCARDIOGRAM REPORT   Patient Name:   Teresa Murillo Date of Exam: 11/09/2021 Medical Rec #:  [id number]       Height:       65.0 in Accession #:    [id number]      Weight:       135.0 lb Date of Birth:  08/18/1960      BSA:          1.674 m Patient Age:    60 years        BP:           107/63 mmHg Patient Gender: F               HR:           79 bpm. Exam Location:  Inpatient Procedure: 2D Echo, Cardiac Doppler and Color Doppler Indications:    Pericardial Effusion I31.3  History:        Patient has prior history of Echocardiogram examinations, most                 recent 09/10/2021. COPD, Arrythmias:Atrial Flutter; Risk                 Factors:Hypertension, Diabetes and Dyslipidemia.  Sonographer:    Lucendia Herrlich  Referring Phys: [phone] LINDSAY B ROBERTS IMPRESSIONS  1. A small pericardial effusion is present, circumferential with no evidence of mitral valve respirophasic variation of inflow Doppler. Patient did not complete exam: cannot see IVC. In views obtained, study not suggestive of cardiac tamponade.  2. Left ventricular ejection fraction, by estimation, is 60 to 65%. The left ventricle has normal function. Left ventricular endocardial border not optimally defined to evaluate regional wall motion. There is mild concentric left ventricular hypertrophy. Left ventricular diastolic parameters are consistent with Grade I diastolic dysfunction (impaired relaxation).  3. Right ventricular systolic function is normal. The right ventricular size is normal.  4. Left atrial size was mildly dilated.  5. The mitral valve is degenerative. Mild mitral valve regurgitation. Moderate mitral annular calcification.  6. The aortic valve is tricuspid. There is mild calcification of the aortic valve. Aortic valve regurgitation is not visualized. Aortic valve sclerosis is present, with no evidence of aortic valve stenosis. Comparison(s): Limited study- pericardial effusion has decreased in size. FINDINGS  Left Ventricle: Left ventricular ejection fraction, by estimation, is 60 to 65%. The left ventricle has normal function. Left ventricular endocardial border not optimally defined to evaluate regional wall motion. The left ventricular internal cavity size was normal in size. There is mild concentric left ventricular hypertrophy. Left ventricular diastolic parameters are consistent with Grade I diastolic dysfunction (impaired relaxation). Right Ventricle: The right ventricular size is normal. No  increase in right ventricular wall thickness. Right ventricular systolic function is normal. Left Atrium: Left atrial size was mildly dilated. Right Atrium: Right atrial size was normal in size. Pericardium: A small pericardial effusion is present.  Mitral Valve: The mitral valve is degenerative in appearance. Moderate mitral annular calcification. Mild mitral valve regurgitation. Tricuspid Valve: The tricuspid valve is normal in structure. Tricuspid valve regurgitation is trivial. No evidence of tricuspid stenosis. Aortic Valve: The aortic valve is tricuspid. There is mild calcification of the aortic valve. Aortic valve regurgitation is not visualized. Aortic valve sclerosis is present, with no evidence of aortic valve stenosis. Pulmonic Valve: The pulmonic valve was normal in structure. Pulmonic valve regurgitation is mild. No evidence of pulmonic stenosis. Aorta: The aortic root and ascending aorta are structurally normal, with no evidence of dilitation. IAS/Shunts: No atrial level shunt detected by color flow Doppler.  LEFT VENTRICLE PLAX 2D LVIDd:         3.80 cm   Diastology LVIDs:         2.50 cm   LV e' lateral:   5.44 cm/s LV PW:         1.20 cm   LV E/e' lateral: 21.7 LV IVS:        1.20 cm LVOT diam:     2.00 cm LV SV:         75 LV SV Index:   45 LVOT Area:     3.14 cm  RIGHT VENTRICLE RV S prime:     14.60 cm/s TAPSE (M-mode): 1.6 cm LEFT ATRIUM           Index        RIGHT ATRIUM           Index LA diam:      3.60 cm 2.15 cm/m   RA Area:     12.70 cm LA Vol (A4C): 57.1 ml 34.11 ml/m  RA Volume:   27.90 ml  16.67 ml/m  AORTIC VALVE             PULMONIC VALVE LVOT Vmax:   123.00 cm/s PR End Diast Vel: 6.86 msec LVOT Vmean:  82.500 cm/s LVOT VTI:    0.238 m  AORTA Ao Root diam: 3.10 cm Ao Asc diam:  3.40 cm MITRAL VALVE                TRICUSPID VALVE MV Area (PHT): 3.12 cm     TR Peak grad:   15.2 mmHg MV Decel Time: 243 msec     TR Vmax:        195.00 cm/s MV E velocity: 118.00 cm/s MV A velocity: 185.00 cm/s  SHUNTS MV E/A ratio:  0.64         Systemic VTI:  0.24 m                             Systemic Diam: 2.00 cm Riley Lam MD Electronically signed by Riley Lam MD Signature Date/Time: 11/09/2021/4:44:14 PM    Final     MR BRAIN WO CONTRAST  Result Date: 11/09/2021 CLINICAL DATA:  Presents with encephalopathy and hallucinations. History of schizophrenia, alcoholism, anemia. EXAM: MRI HEAD WITHOUT CONTRAST TECHNIQUE: Multiplanar, multiecho pulse sequences of the brain and surrounding structures were obtained without intravenous contrast. COMPARISON:  CT head 1 day prior FINDINGS: Brain: There is no acute intracranial hemorrhage, extra-axial fluid collection, or acute infarct. Parenchymal volume is normal. The ventricles are  normal in size. Gray-white differentiation is preserved. There are scattered small foci of FLAIR signal abnormality in the supratentorial white matter likely reflecting sequela of minimal underlying chronic small vessel ischemic change. There is no suspicious parenchymal signal abnormality. There is no mass lesion. There is no mass effect or midline shift. Vascular: Normal flow voids. Skull and upper cervical spine: There is no suspicious marrow signal abnormality. There is apparent moderate spinal canal stenosis at C2-C3 with possible cord compression. Sinuses/Orbits: The paranasal sinuses are clear. The globes and orbits are unremarkable. Other: None. IMPRESSION: 1. No acute intracranial pathology. 2. 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 Consider cervical spine MRI for better evaluation. Electronically Signed   By: Valetta Mole M.D.   On: 11/09/2021 11:15   EEG adult  Result Date: 11/09/2021 Lora Havens, MD     11/09/2021 11:20 AM Patient Name: Teresa Murillo MRN: [id number] Epilepsy Attending: Lora Havens Referring Physician/Provider: Thurnell Lose, MD Date: 11/09/2021 Duration: 22.17 mins Patient history: 61 year old female with altered mental status.  EEG to evaluate for seizure. Level of alertness: Awake AEDs during EEG study: None Technical aspects: This EEG study was done with scalp electrodes positioned according to the 10-20 International system of  electrode placement. Electrical activity was reviewed with band pass filter of 1-70Hz , sensitivity of 7 uV/mm, display speed of 59mm/sec with a 60Hz  notched filter applied as appropriate. EEG data were recorded continuously and digitally stored.  Video monitoring was available and reviewed as appropriate. Description: The posterior dominant rhythm consists of 8 Hz activity of moderate voltage (25-35 uV) seen predominantly in posterior head regions, symmetric and reactive to eye opening and eye closing. EEG showed intermittent generalized 3 to 6 Hz theta-delta slowing. Hyperventilation and photic stimulation were not performed.   ABNORMALITY - Intermittent slow, generalized IMPRESSION: This study is suggestive of mild diffuse encephalopathy, nonspecific etiology. No seizures or epileptiform discharges were seen throughout the recording. Teresa Murillo   CT HEAD WO CONTRAST (5MM)  Result Date: 11/08/2021 CLINICAL DATA:  Hallucinations EXAM: CT HEAD WITHOUT CONTRAST TECHNIQUE: Contiguous axial images were obtained from the base of the skull through the vertex without intravenous contrast. RADIATION DOSE REDUCTION: This exam was performed according to the departmental dose-optimization program which includes automated exposure control, adjustment of the mA and/or kV according to patient size and/or use of iterative reconstruction technique. COMPARISON:  09/09/2021 FINDINGS: Brain: No evidence of acute infarction, hemorrhage, hydrocephalus, extra-axial collection or mass lesion/mass effect. Vascular: Mild intracranial atherosclerosis. Skull: Normal. Negative for fracture or focal lesion. Sinuses/Orbits: The visualized paranasal sinuses are essentially clear. The mastoid air cells are unopacified. Other: None.  Normal head CT. IMPRESSION: Normal head CT. Electronically Signed   By: Julian Hy M.D.   On: 11/08/2021 19:04   CT ABDOMEN PELVIS W CONTRAST  Result Date: 11/08/2021 CLINICAL DATA:  Abdominal pain  EXAM: CT ABDOMEN AND PELVIS WITH CONTRAST TECHNIQUE: Multidetector CT imaging of the abdomen and pelvis was performed using the standard protocol following bolus administration of intravenous contrast. RADIATION DOSE REDUCTION: This exam was performed according to the departmental dose-optimization program which includes automated exposure control, adjustment of the mA and/or kV according to patient size and/or use of iterative reconstruction technique. CONTRAST:  154mL OMNIPAQUE IOHEXOL 300 MG/ML  SOLN COMPARISON:  None Available. FINDINGS: Lower chest: Minimal scarring/atelectasis the lung bases. Trace right pleural fluid. Hepatobiliary: Liver is within normal limits. Gallbladder is decompressed. No intrahepatic or extrahepatic duct dilatation. Pancreas: Within normal  limits. Spleen: Within normal limits Adrenals/Urinary Tract: Adrenal glands are within normal limits. Kidneys are within normal limits. Right extrarenal pelvis. No frank hydronephrosis. Bladder is within normal limits. Stomach/Bowel: Stomach is notable for a large hiatal hernia. No evidence of bowel obstruction. Normal appendix (series 3/image 65). No colonic wall thickening or inflammatory changes. Vascular/Lymphatic: No evidence of abdominal aortic aneurysm. Atherosclerotic calcifications of the abdominal aorta and branch vessels. No suspicious abdominopelvic lymphadenopathy. Reproductive: Uterus is within normal limits. Bilateral ovaries are within normal limits. Other: No abdominopelvic ascites. Musculoskeletal: Mild degenerative changes of the lower thoracic spine. IMPRESSION: No CT findings to account for the patient's abdominal pain. Large hiatal hernia. Electronically Signed   By: Julian Hy M.D.   On: 11/08/2021 19:03   DG Chest Portable 1 View  Result Date: 11/08/2021 CLINICAL DATA:  Altered mental status, tachycardia, nausea and vomiting. EXAM: PORTABLE CHEST 1 VIEW COMPARISON:  Chest x-ray dated 06/02/2020 FINDINGS: Grossly  stable cardiomegaly. No evidence of active CHF. No evidence of pneumonia or pulmonary edema. No pleural effusion or pneumothorax is seen. Moderate-sized hiatal hernia. IMPRESSION: 1. No active disease. No evidence of CHF or pneumonia. 2. Stable cardiomegaly. Electronically Signed   By: Franki Cabot M.D.   On: 11/08/2021 17:58    Cardiac Studies   ECHO: 11/09/2021  1. A small pericardial effusion is present, circumferential with no  evidence of mitral valve respirophasic variation of inflow Doppler.  Patient did not complete exam: cannot see IVC. In views obtained, study  not suggestive of cardiac tamponade.   2. Left ventricular ejection fraction, by estimation, is 60 to 65%. The  left ventricle has normal function. Left ventricular endocardial border  not optimally defined to evaluate regional wall motion. There is mild  concentric left ventricular  hypertrophy. Left ventricular diastolic parameters are consistent with  Grade I diastolic dysfunction (impaired relaxation).   3. Right ventricular systolic function is normal. The right ventricular  size is normal.   4. Left atrial size was mildly dilated.   5. The mitral valve is degenerative. Mild mitral valve regurgitation.  Moderate mitral annular calcification.   6. The aortic valve is tricuspid. There is mild calcification of the  aortic valve. Aortic valve regurgitation is not visualized. Aortic valve  sclerosis is present, with no evidence of aortic valve stenosis.   Comparison(s): Limited study- pericardial effusion has decreased in size.   Patient Profile     61 y.o. female who lives in a group home with a hx of schizophrenia, COPD, hyperlipidemia, hypertension, diabetes, alcohol use, hypothyroidism and anemia who was admitted 09/10 with AMS/hallucinations. Cards saw 11/09/2021 for atrial fibrillation   Assessment & Plan    Atrial fibrillation, RVR - s/p spont conversion to SR - was on IV Dilt >> po Dilt 60 mg q 8 hr - got 3  doses yesterday >> change to po at 180 mg qd - see echo report above, L atrium mildly dilated, effusion is smaller, EF nl but u/a to eval completely for WMA (did not complete exam) - cont Eliquis  - This patients CHA2DS2-VASc Score and unadjusted Ischemic Stroke Rate (% per year) is equal to 3.2 % stroke rate/year from a score of 3  Above score calculated as 1 point each if present [CHF, HTN, DM, Vascular=MI/PAD/Aortic Plaque, Age if 65-74, or Female] Above score calculated as 2 points each if present [Age > 75, or Stroke/TIA/TE]   2. Schizophrenia, DM, HTN and other issues, per IM  For questions or updates,  please contact [place] Please consult [email or web address] for contact info under  Signed, Rosaria Ferries, PA-C  11/10/2021, 8:05 AM     Patient seen and examined, note reviewed with the signed Advanced Practice Provider. I personally reviewed laboratory data, imaging studies and relevant notes. I independently examined the patient and formulated the important aspects of the plan. I have personally discussed the plan with the patient and/or family. Comments or changes to the note/plan are indicated below.  She is still in sinus rhythm.  She has been appropriately placed on anticoagulation with Eliquis.  I agree with transitioning the patient to p.o. Cardizem. She will need follow-up in the outpatient setting with cardiology.  I did discuss with the patient.  All of her questions has been answered.  [place] will sign off.   Medication Recommendations: Cardizem 60 every 8 hours, Eliquis 5 mg twice daily Other recommendations (labs, testing, etc): None Follow up as an outpatient: Routine cardiology follow-up.  Berniece Salines DO, MS [facility] Attending Cardiologist [place]  [address] [place], [place] [address] [phone] Website: [email or web address]
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
## 2021-11-10 NOTE — Telephone Encounter (Signed)
Pharmacy Patient Advocate Encounter  Insurance verification completed.    The patient is insured through [place] [employer]   The patient is currently admitted and ran test claims for the following: Eliquis, Xarelto.  Copays and coinsurance results were relayed to Inpatient clinical team.      

## 2021-11-11 DIAGNOSIS — I4892 Unspecified atrial flutter: Secondary | ICD-10-CM | POA: Diagnosis not present

## 2021-11-11 LAB — GLUCOSE, CAPILLARY
Glucose-Capillary: 109 mg/dL — ABNORMAL HIGH (ref 70–99)
Glucose-Capillary: 137 mg/dL — ABNORMAL HIGH (ref 70–99)
Glucose-Capillary: 134 mg/dL — ABNORMAL HIGH (ref 70–99)
Glucose-Capillary: 159 mg/dL — ABNORMAL HIGH (ref 70–99)

## 2021-11-11 LAB — COMPREHENSIVE METABOLIC PANEL
ALT: 12 U/L (ref 0–44)
AST: 16 U/L (ref 15–41)
Albumin: 3.1 g/dL — ABNORMAL LOW (ref 3.5–5.0)
Alkaline Phosphatase: 51 U/L (ref 38–126)
Anion gap: 8 (ref 5–15)
BUN: 9 mg/dL (ref 6–20)
CO2: 27 mmol/L (ref 22–32)
Calcium: 9 mg/dL (ref 8.9–10.3)
Chloride: 99 mmol/L (ref 98–111)
Creatinine, Ser: 0.59 mg/dL (ref 0.44–1.00)
GFR, Estimated: >60 mL/min (ref >60)
Glucose, Bld: 152 mg/dL — ABNORMAL HIGH (ref 70–99)
Potassium: 4.5 mmol/L (ref 3.5–5.1)
Sodium: 134 mmol/L — ABNORMAL LOW (ref 135–145)
Total Bilirubin: 0.5 mg/dL (ref 0.3–1.2)
Total Protein: 6.4 g/dL — ABNORMAL LOW (ref 6.5–8.1)

## 2021-11-11 LAB — CBC WITH DIFFERENTIAL/PLATELET
Abs Immature Granulocytes: 0.03 10*3/uL (ref 0.00–0.07)
Basophils Absolute: 0 10*3/uL (ref 0.0–0.1)
Basophils Relative: 1 %
Eosinophils Absolute: 0.1 10*3/uL (ref 0.0–0.5)
Eosinophils Relative: 2 %
HCT: 35 % — ABNORMAL LOW (ref 36.0–46.0)
Hemoglobin: 11.2 g/dL — ABNORMAL LOW (ref 12.0–15.0)
Immature Granulocytes: 1 %
Lymphocytes Relative: 29 %
Lymphs Abs: 1.6 10*3/uL (ref 0.7–4.0)
MCH: 25.9 pg — ABNORMAL LOW (ref 26.0–34.0)
MCHC: 32 g/dL (ref 30.0–36.0)
MCV: 80.8 fL (ref 80.0–100.0)
Monocytes Absolute: 0.6 10*3/uL (ref 0.1–1.0)
Monocytes Relative: 11 %
Neutro Abs: 3.2 10*3/uL (ref 1.7–7.7)
Neutrophils Relative %: 56 %
Platelets: 275 10*3/uL (ref 150–400)
RBC: 4.33 MIL/uL (ref 3.87–5.11)
RDW: 16.9 % — ABNORMAL HIGH (ref 11.5–15.5)
WBC: 5.6 10*3/uL (ref 4.0–10.5)
nRBC: 0 % (ref 0.0–0.2)

## 2021-11-11 LAB — MAGNESIUM: Magnesium: 1.4 mg/dL — ABNORMAL LOW (ref 1.7–2.4)

## 2021-11-11 LAB — BRAIN NATRIURETIC PEPTIDE: B Natriuretic Peptide: 52.3 pg/mL (ref 0.0–100.0)

## 2021-11-11 LAB — URINE CULTURE

## 2021-11-11 NOTE — Plan of Care (Signed)
  Problem: Clinical Measurements: Goal: Cardiovascular complication will be avoided Outcome: Progressing   Problem: Activity: Goal: Risk for activity intolerance will decrease Outcome: Progressing   Problem: Nutrition: Goal: Adequate nutrition will be maintained Outcome: Progressing   Problem: Elimination: Goal: Will not experience complications related to urinary retention Outcome: Progressing   

## 2021-11-11 NOTE — Progress Notes (Signed)
PROGRESS NOTE                                                                                                                                                                                                             Patient Demographics:    Teresa Murillo, is a 61 y.o. female, DOB - 08-04-1960, IRW:[id number]  Outpatient Primary MD for the patient is Christen Bame, [place]    LOS - 2  Admit date - 11/08/2021    Chief Complaint  Patient presents with   Medical Clearance       Brief Narrative (HPI from H&P)  61 y.o. female with medical history significant of schizophrenia, COPD, hyperlipidemia, hypertension, diabetes, alcohol, hypothyroidism, anemia presenting with encephalopathy and hallucinations.  In the ER work-up consistent with acute encephalopathy, AKI along with A-fib RVR.   Subjective:   Patient in bed, appears comfortable, denies any headache, no fever, no chest pain or pressure, no shortness of breath , no abdominal pain. No new focal weakness.   Assessment  & Plan :    Newly diagnosed likely paroxysmal atrial flutter with RVR with [place] vas 2 score of 2 or higher.   Patient placed on IV Cardizem drip currently on oral Cardizem dose adjusted further on 11/10/2021 for better control, cardiology on board, continue Eliquis for now.  TSH stable, echo nonacute.   Medically stable will look for appropriate facility for discharge her previous facility has no bed for her.   Acute encephalopathy -  Patient presenting from facility after issues with acute cephalopathy and hallucinations.  She is have history of schizophrenia, EEG, CT head and MRI brain nonacute, CT abdomen pelvis nonacute, no headache or nuchal rigidity, likely mildly dehydrated for which she is getting IV fluids, and by psychiatry this admission.  Per psych she is stable.  On following regimen.  -- Clozapine 50 mg BID -- on depakote 500 QHS -- starting home  ingrezza 40 for tomorrow -- do not see indication for olanzapine at this time.    AKI -  Elevated 1.17 baseline around 0.5.  Likely due to dehydration hydrate with IV fluids monitor bladder scans.  CT abdomen pelvis nonacute.   Schizophrenia  - per SNF was hallucinating prior to hospital transfer, have consulted psych to assist with schizophrenia medications and management.  COPD - Symbicort with formulary [place],  continue as needed albuterol   Hypertension  - Hold home antihypertensives currently on diltiazem.   Glaucoma - Continue home eyedrops   Borderline elevation of ammonia.  Placed on lactulose.    Chronic right foot discomfort after an injury. Stable X-rays with chronic changes outpatient orthopedic follow-up.  Diabetes - SSI    Lab Results  Component Value Date   HGBA1C 7.0 (H) 10/04/2021   CBG (last 3)  Recent Labs    11/10/21 1545 11/10/21 2121 11/11/21 0804  GLUCAP 116* 182* 109*         Condition - Fair  Family Communication  : None present.  Code Status :  Full  Consults  :  Psych, Cards  PUD Prophylaxis : PPI   Procedures  :     TTE - 1. A small pericardial effusion is present, circumferential with no evidence of mitral valve respirophasic variation of inflow Doppler. Patient did not complete exam: cannot see IVC. In views obtained, study not suggestive of cardiac tamponade.  2. Left ventricular ejection fraction, by estimation, is 60 to 65%. The left ventricle has normal function. Left ventricular endocardial border not optimally defined to evaluate regional wall motion. There is mild concentric left ventricular hypertrophy. Left ventricular diastolic parameters are consistent with Grade I diastolic dysfunction (impaired relaxation).  3. Right ventricular systolic function is normal. The right ventricular size is normal.  4. Left atrial size was mildly dilated.  5. The mitral valve is degenerative. Mild mitral valve regurgitation. Moderate mitral  annular calcification.  6. The aortic valve is tricuspid. There is mild calcification of the aortic valve. Aortic valve regurgitation is not visualized. Aortic valve sclerosis is present, with no evidence of aortic valve stenosis. Comparison(s): Limited study- pericardial effusion has decreased in size.  EEG - Non Acute  CT head and MRI brain.  Both nonacute.  CT scan abdomen pelvis.  Nonacute.      Disposition Plan  :    Status is: Observation  DVT Prophylaxis  :     apixaban (ELIQUIS) tablet 5 mg   Lab Results  Component Value Date   PLT 261 11/10/2021    Diet :  Diet Order             Diet heart healthy/carb modified Room service appropriate? Yes; Fluid consistency: Thin  Diet effective now                    Inpatient Medications  Scheduled Meds:  apixaban  5 mg Oral BID   cloZAPine  50 mg Oral BID   diltiazem  180 mg Oral Daily   divalproex  500 mg Oral Daily   insulin aspart  0-9 Units Subcutaneous TID WC   lactulose  10 g Oral Daily   latanoprost  1 drop Both Eyes QHS   mometasone-formoterol  2 puff Inhalation BID   sodium chloride flush  3 mL Intravenous Q12H   valbenazine  40 mg Oral Daily   Continuous Infusions:   PRN Meds:.acetaminophen **OR** acetaminophen, albuterol, diltiazem, guaiFENesin-dextromethorphan, hydrALAZINE, polyethylene glycol  Antibiotics  :    Anti-infectives (From admission, onward)    None        Time Spent in minutes  30   Lala Lund M.D on 11/11/2021 at 8:50 AM  To page go to [email or web address]   [facility] -  Office  [phone]  See all Orders from today for further details    Objective:   Vitals:   11/10/21 2021 11/11/21  1610 11/11/21 0755 11/11/21 0849  BP: 120/80 (!) 143/78 125/88   Pulse: 100 86 86   Resp: 18 18 16    Temp: 98.3 F (36.8 C) 98.1 F (36.7 C) 98.2 F (36.8 C)   TempSrc: Oral Oral Oral   SpO2: 95% 96% 100% 90%    Wt Readings from Last 3 Encounters:  08/22/21 61.2 kg   11/01/20 68 kg  06/02/20 69.4 kg     Intake/Output Summary (Last 24 hours) at 11/11/2021 0850 Last data filed at 11/11/2021 0600 Gross per 24 hour  Intake 720 ml  Output --  Net 720 ml     Physical Exam  Awake Alert, No new F.N deficits, Normal affect [place].AT,PERRAL Supple Neck, No JVD,   Symmetrical Chest wall movement, Good air movement bilaterally, CTAB RRR,No Gallops, Rubs or new Murmurs,  +ve B.Sounds, Abd Soft, No tenderness,   No Cyanosis, Clubbing or edema      Data Review:    CBC Recent Labs  Lab 11/08/21 1048 11/08/21 1858 11/09/21 0451 11/10/21 0421  WBC 7.7  --  6.9 6.6  HGB 12.3 12.2 11.2* 10.5*  HCT 38.2 36.0 34.0* 32.0*  PLT 286  --  255 261  MCV 80.8  --  80.0 79.4*  MCH 26.0  --  26.4 26.1  MCHC 32.2  --  32.9 32.8  RDW 16.7*  --  16.8* 16.6*  LYMPHSABS 1.2  --   --  2.6  MONOABS 0.7  --   --  0.6  EOSABS 0.1  --   --  0.2  BASOSABS 0.0  --   --  0.1    Electrolytes Recent Labs  Lab 11/08/21 1048 11/08/21 1718 11/08/21 1730 11/08/21 1858 11/08/21 2118 11/09/21 0451 11/10/21 0421  NA 138  --   --  135  --  133* 135  K 4.1  --   --  3.9  --  4.0 4.3  CL 104  --   --   --   --  100 102  CO2 23  --   --   --   --  24 25  GLUCOSE 129*  --   --   --   --  110* 136*  BUN 16  --   --   --   --  7 6  CREATININE 1.17*  --   --   --   --  0.52 0.56  CALCIUM 9.3  --   --   --   --  8.5* 8.8*  AST 20  --   --   --   --  15 17  ALT 15  --   --   --   --  12 12  ALKPHOS 65  --   --   --   --  52 53  BILITOT 0.5  --   --   --   --  0.7 0.5  ALBUMIN 3.7  --   --   --   --  3.0* 3.2*  MG  --   --  1.7  --   --   --  1.4*  TSH  --   --  1.293  --   --   --   --   AMMONIA  --  49*  --   --   --   --  28  BNP  --   --   --   --  21.1  --  69.3    ------------------------------------------------------------------------------------------------------------------  No results for input(s): "CHOL", "HDL", "LDLCALC", "TRIG", "CHOLHDL", "LDLDIRECT" in  the last 72 hours.  Lab Results  Component Value Date   HGBA1C 7.0 (H) 10/04/2021    Recent Labs    11/08/21 1730  TSH 1.293   ------------------------------------------------------------------------------------------------------------------ ID Labs Recent Labs  Lab 11/08/21 1048 11/09/21 0451 11/10/21 0421  WBC 7.7 6.9 6.6  PLT 286 255 261  CREATININE 1.17* 0.52 0.56   Cardiac Enzymes No results for input(s): "CKMB", "TROPONINI", "MYOGLOBIN" in the last 168 hours.  Invalid input(s): "CK"   Radiology Reports DG Foot Complete Right  Result Date: 11/10/2021 CLINICAL DATA:  Right foot and ankle pain.  No recent injury EXAM: RIGHT FOOT COMPLETE - 3+ VIEW COMPARISON:  Right foot 10/21/2020 FINDINGS: Mild widening between the first and second metatarsals unchanged. 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 Mild degenerative change in the midfoot with spurring. Mild calcaneal spurring. Chronic periosteal thickening distal tibia and fibula due to chronic injury. IMPRESSION: No acute injury. Degenerative change in midfoot. 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 Electronically Signed   By: Franchot Gallo M.D.   On: 11/10/2021 12:13   DG Ankle Complete Right  Result Date: 11/10/2021 CLINICAL DATA:  Foot and ankle pain.  No recent injury EXAM: RIGHT ANKLE - COMPLETE 3+ VIEW COMPARISON:  Right ankle 09/09/2021 FINDINGS: Normal alignment no acute fracture Chronic periosteal thickening distal tibia and fibula due to chronic injury. This is unchanged. No joint effusion. IMPRESSION: Chronic ankle injury.  No acute fracture Electronically Signed   By: Franchot Gallo M.D.   On: 11/10/2021 12:11   ECHOCARDIOGRAM COMPLETE  Result Date: 11/09/2021    ECHOCARDIOGRAM REPORT   Patient Name:   SEHER SHOVE Date of Exam: 11/09/2021 Medical Rec #:  [id number]       Height:       65.0 in Accession #:    [id number]      Weight:       135.0 lb Date of Birth:  03/23/1960      BSA:          1.674 m Patient Age:    31  years        BP:           107/63 mmHg Patient Gender: F               HR:           79 bpm. Exam Location:  Inpatient Procedure: 2D Echo, Cardiac Doppler and Color Doppler Indications:    Pericardial Effusion I31.3  History:        Patient has prior history of Echocardiogram examinations, most                 recent 09/10/2021. COPD, Arrythmias:Atrial Flutter; Risk                 Factors:Hypertension, Diabetes and Dyslipidemia.  Sonographer:    Ronny Flurry Referring Phys: [place]  1. A small pericardial effusion is present, circumferential with no evidence of mitral valve respirophasic variation of inflow Doppler. Patient did not complete exam: cannot see IVC. In views obtained, study not suggestive of cardiac tamponade.  2. Left ventricular ejection fraction, by estimation, is 60 to 65%. The left ventricle has normal function. Left ventricular endocardial border not optimally defined to evaluate regional wall motion. There is mild concentric left ventricular hypertrophy. Left ventricular diastolic parameters are consistent with Grade I diastolic dysfunction (impaired relaxation).  3. Right ventricular systolic function is normal.  The right ventricular size is normal.  4. Left atrial size was mildly dilated.  5. The mitral valve is degenerative. Mild mitral valve regurgitation. Moderate mitral annular calcification.  6. The aortic valve is tricuspid. There is mild calcification of the aortic valve. Aortic valve regurgitation is not visualized. Aortic valve sclerosis is present, with no evidence of aortic valve stenosis. Comparison(s): Limited study- pericardial effusion has decreased in size. FINDINGS  Left Ventricle: Left ventricular ejection fraction, by estimation, is 60 to 65%. The left ventricle has normal function. Left ventricular endocardial border not optimally defined to evaluate regional wall motion. The left ventricular internal cavity size was normal in size. There is  mild concentric left ventricular hypertrophy. Left ventricular diastolic parameters are consistent with Grade I diastolic dysfunction (impaired relaxation). Right Ventricle: The right ventricular size is normal. No increase in right ventricular wall thickness. Right ventricular systolic function is normal. Left Atrium: Left atrial size was mildly dilated. Right Atrium: Right atrial size was normal in size. Pericardium: A small pericardial effusion is present. Mitral Valve: The mitral valve is degenerative in appearance. Moderate mitral annular calcification. Mild mitral valve regurgitation. Tricuspid Valve: The tricuspid valve is normal in structure. Tricuspid valve regurgitation is trivial. No evidence of tricuspid stenosis. Aortic Valve: The aortic valve is tricuspid. There is mild calcification of the aortic valve. Aortic valve regurgitation is not visualized. Aortic valve sclerosis is present, with no evidence of aortic valve stenosis. Pulmonic Valve: The pulmonic valve was normal in structure. Pulmonic valve regurgitation is mild. No evidence of pulmonic stenosis. Aorta: The aortic root and ascending aorta are structurally normal, with no evidence of dilitation. IAS/Shunts: No atrial level shunt detected by color flow Doppler.  LEFT VENTRICLE PLAX 2D LVIDd:         3.80 cm   Diastology LVIDs:         2.50 cm   LV e' lateral:   5.44 cm/s LV PW:         1.20 cm   LV E/e' lateral: 21.7 LV IVS:        1.20 cm LVOT diam:     2.00 cm LV SV:         75 LV SV Index:   45 LVOT Area:     3.14 cm  RIGHT VENTRICLE RV S prime:     14.60 cm/s TAPSE (M-mode): 1.6 cm LEFT ATRIUM           Index        RIGHT ATRIUM           Index LA diam:      3.60 cm 2.15 cm/m   RA Area:     12.70 cm LA Vol (A4C): 57.1 ml 34.11 ml/m  RA Volume:   27.90 ml  16.67 ml/m  AORTIC VALVE             PULMONIC VALVE LVOT Vmax:   123.00 cm/s PR End Diast Vel: 6.86 msec LVOT Vmean:  82.500 cm/s LVOT VTI:    0.238 m  AORTA Ao Root diam: 3.10 cm Ao  Asc diam:  3.40 cm MITRAL VALVE                TRICUSPID VALVE MV Area (PHT): 3.12 cm     TR Peak grad:   15.2 mmHg MV Decel Time: 243 msec     TR Vmax:        195.00 cm/s MV E velocity: 118.00 cm/s MV A velocity: 185.00 cm/s  SHUNTS MV E/A ratio:  0.64         Systemic VTI:  0.24 m                             Systemic Diam: 2.00 cm Rudean Haskell MD Electronically signed by Rudean Haskell MD Signature Date/Time: 11/09/2021/4:44:14 PM    Final    MR BRAIN WO CONTRAST  Result Date: 11/09/2021 CLINICAL DATA:  Presents with encephalopathy and hallucinations. History of schizophrenia, alcoholism, anemia. EXAM: MRI HEAD WITHOUT CONTRAST TECHNIQUE: Multiplanar, multiecho pulse sequences of the brain and surrounding structures were obtained without intravenous contrast. COMPARISON:  CT head 1 day prior FINDINGS: Brain: There is no acute intracranial hemorrhage, extra-axial fluid collection, or acute infarct. Parenchymal volume is normal. The ventricles are normal in size. Gray-white differentiation is preserved. There are scattered small foci of FLAIR signal abnormality in the supratentorial white matter likely reflecting sequela of minimal underlying chronic small vessel ischemic change. There is no suspicious parenchymal signal abnormality. There is no mass lesion. There is no mass effect or midline shift. Vascular: Normal flow voids. Skull and upper cervical spine: There is no suspicious marrow signal abnormality. There is apparent moderate spinal canal stenosis at C2-C3 with possible cord compression. Sinuses/Orbits: The paranasal sinuses are clear. The globes and orbits are unremarkable. Other: None. IMPRESSION: 1. No acute intracranial pathology. 2. 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 Consider cervical spine MRI for better evaluation. Electronically Signed   By: Valetta Mole M.D.   On: 11/09/2021 11:15   EEG adult  Result Date: 11/09/2021 Lora Havens, MD      11/09/2021 11:20 AM Patient Name: Teresa Murillo MRN: [id number] Epilepsy Attending: Lora Havens Referring Physician/Provider: Thurnell Lose, MD Date: 11/09/2021 Duration: 22.17 mins Patient history: 61 year old female with altered mental status.  EEG to evaluate for seizure. Level of alertness: Awake AEDs during EEG study: None Technical aspects: This EEG study was done with scalp electrodes positioned according to the 10-20 International system of electrode placement. Electrical activity was reviewed with band pass filter of 1-70Hz , sensitivity of 7 uV/mm, display speed of 16mm/sec with a 60Hz  notched filter applied as appropriate. EEG data were recorded continuously and digitally stored.  Video monitoring was available and reviewed as appropriate. Description: The posterior dominant rhythm consists of 8 Hz activity of moderate voltage (25-35 uV) seen predominantly in posterior head regions, symmetric and reactive to eye opening and eye closing. EEG showed intermittent generalized 3 to 6 Hz theta-delta slowing. Hyperventilation and photic stimulation were not performed.   ABNORMALITY - Intermittent slow, generalized IMPRESSION: This study is suggestive of mild diffuse encephalopathy, nonspecific etiology. No seizures or epileptiform discharges were seen throughout the recording. Priyanka Barbra Sarks   CT HEAD WO CONTRAST (5MM)  Result Date: 11/08/2021 CLINICAL DATA:  Hallucinations EXAM: CT HEAD WITHOUT CONTRAST TECHNIQUE: Contiguous axial images were obtained from the base of the skull through the vertex without intravenous contrast. RADIATION DOSE REDUCTION: This exam was performed according to the departmental dose-optimization program which includes automated exposure control, adjustment of the mA and/or kV according to patient size and/or use of iterative reconstruction technique. COMPARISON:  09/09/2021 FINDINGS: Brain: No evidence of acute infarction, hemorrhage, hydrocephalus, extra-axial collection  or mass lesion/mass effect. Vascular: Mild intracranial atherosclerosis. Skull: Normal. Negative for fracture or focal lesion. Sinuses/Orbits: The visualized paranasal sinuses are essentially clear. The mastoid air cells are unopacified. Other: None.  Normal  head CT. IMPRESSION: Normal head CT. Electronically Signed   By: Julian Hy M.D.   On: 11/08/2021 19:04   CT ABDOMEN PELVIS W CONTRAST  Result Date: 11/08/2021 CLINICAL DATA:  Abdominal pain EXAM: CT ABDOMEN AND PELVIS WITH CONTRAST TECHNIQUE: Multidetector CT imaging of the abdomen and pelvis was performed using the standard protocol following bolus administration of intravenous contrast. RADIATION DOSE REDUCTION: This exam was performed according to the departmental dose-optimization program which includes automated exposure control, adjustment of the mA and/or kV according to patient size and/or use of iterative reconstruction technique. CONTRAST:  124mL OMNIPAQUE IOHEXOL 300 MG/ML  SOLN COMPARISON:  None Available. FINDINGS: Lower chest: Minimal scarring/atelectasis the lung bases. Trace right pleural fluid. Hepatobiliary: Liver is within normal limits. Gallbladder is decompressed. No intrahepatic or extrahepatic duct dilatation. Pancreas: Within normal limits. Spleen: Within normal limits Adrenals/Urinary Tract: Adrenal glands are within normal limits. Kidneys are within normal limits. Right extrarenal pelvis. No frank hydronephrosis. Bladder is within normal limits. Stomach/Bowel: Stomach is notable for a large hiatal hernia. No evidence of bowel obstruction. Normal appendix (series 3/image 65). No colonic wall thickening or inflammatory changes. Vascular/Lymphatic: No evidence of abdominal aortic aneurysm. Atherosclerotic calcifications of the abdominal aorta and branch vessels. No suspicious abdominopelvic lymphadenopathy. Reproductive: Uterus is within normal limits. Bilateral ovaries are within normal limits. Other: No abdominopelvic  ascites. Musculoskeletal: Mild degenerative changes of the lower thoracic spine. IMPRESSION: No CT findings to account for the patient's abdominal pain. Large hiatal hernia. Electronically Signed   By: Julian Hy M.D.   On: 11/08/2021 19:03   DG Chest Portable 1 View  Result Date: 11/08/2021 CLINICAL DATA:  Altered mental status, tachycardia, nausea and vomiting. EXAM: PORTABLE CHEST 1 VIEW COMPARISON:  Chest x-ray dated 06/02/2020 FINDINGS: Grossly stable cardiomegaly. No evidence of active CHF. No evidence of pneumonia or pulmonary edema. No pleural effusion or pneumothorax is seen. Moderate-sized hiatal hernia. IMPRESSION: 1. No active disease. No evidence of CHF or pneumonia. 2. Stable cardiomegaly. Electronically Signed   By: Franki Cabot M.D.   On: 11/08/2021 17:58

## 2021-11-11 NOTE — TOC Progression Note (Addendum)
Transition of Care [facility]) - Progression Note    Patient Details  Name: Teresa Murillo MRN: [id number] Date of Birth: 1960-09-10  [facility] CM/SW Contact  Delilah Shan, LCSWA Phone Number: 11/11/2021, 10:55 AM  Clinical Narrative:     Update-2:00pm- CSW spoke with owner of [facility] who informed CSW she left a message for patients care manager this morning and awaiting callback to discuss where new placement for patient will be. [name] confirmed she will give CSW a call back once she speaks with patients care manager. CSW informed Steward Drone with [facility] leadership.CSW informed MD. CSW will continue to follow.   CSW called owner of group home [name] to discuss dc plan for patient. CSW awaiting callback. CSW will continue to follow and assist with patients dc planning needs.  Expected Discharge Plan: Group Home ([facility]) Barriers to Discharge: Continued Medical Work up  Expected Discharge Plan and Services Expected Discharge Plan: Group Home ([facility]) In-house Referral: Clinical Social Work     Living arrangements for the past 2 months: Group Home                                       Social Determinants of Health (SDOH) Interventions    Readmission Risk Interventions     No data to display

## 2021-11-11 NOTE — Progress Notes (Signed)
Occupational Therapy Treatment Patient Details Name: Teresa Murillo MRN: [id number] DOB: 22-Jan-1961 Today's Date: 11/11/2021   History of present illness Pt is a 61 y.o. female presenting from her group home on 11/08/21 with AMS, hallucinations. Workup for acute encephalopathy, AKI, A-fib RVR. Imaging showed spinal canal stenosis at C2-C3 with suspected cord compression. PMH includes schizophrenia, COPD, HTN, HLD, DM.   OT comments  Patient received in supine and was difficult to arouse. Patient able to get to EOB without assistance and had difficulty staying awake. Patient was able to donn shoes without assistance seated on EOB and demonstrated mobility and toilet transfer without an assistive device with supervision due to lethargic. Patient returned to supine without assistance.  Discharge recommendations continues to be appropriate.    Recommendations for follow up therapy are one component of a multi-disciplinary discharge planning process, led by the attending physician.  Recommendations may be updated based on patient status, additional functional criteria and insurance authorization.    Follow Up Recommendations  Home health OT    Assistance Recommended at Discharge Set up Supervision/Assistance  Patient can return home with the following  A little help with walking and/or transfers;A little help with bathing/dressing/bathroom;Direct supervision/assist for medications management   Equipment Recommendations  BSC/3in1    Recommendations for Other Services      Precautions / Restrictions Precautions Precautions: None Restrictions Weight Bearing Restrictions: No       Mobility Bed Mobility Overal bed mobility: Independent             General bed mobility comments: able to get in and out of bed without assistance    Transfers Overall transfer level: Independent                 General transfer comment: supervision on this date due to lethargic     Balance  Overall balance assessment: Needs assistance Sitting-balance support: No upper extremity supported, Feet supported Sitting balance-Leahy Scale: Good     Standing balance support: No upper extremity supported Standing balance-Leahy Scale: Good Standing balance comment: able to stand without support                           ADL either performed or assessed with clinical judgement   ADL Overall ADL's : Needs assistance/impaired                     Lower Body Dressing: Supervision/safety;Sit to/from stand Lower Body Dressing Details (indicate cue type and reason): donned shoes while seated on EOB Toilet Transfer: Supervision/safety Toilet Transfer Details (indicate cue type and reason): demonstrated toilet transfer with supervision for safety due to lethargic behavior           General ADL Comments: patient difficult to participate due to lethargic and wanting to sleep    Extremity/Trunk Assessment              Vision       Perception     Praxis      Cognition Arousal/Alertness: Lethargic Behavior During Therapy: WFL for tasks assessed/performed Overall Cognitive Status: History of cognitive impairments - at baseline                                 General Comments: patient was difficulty to maintain arousal due to lethargic        Exercises      Shoulder Instructions  General Comments      Pertinent Vitals/ Pain       Pain Assessment Pain Assessment: No/denies pain  Home Living                                          Prior Functioning/Environment              Frequency  Min 2X/week        Progress Toward Goals  OT Goals(current goals can now be found in the care plan section)  Progress towards OT goals: Progressing toward goals  Acute Rehab OT Goals Patient Stated Goal: go home OT Goal Formulation: With patient Time For Goal Achievement: 11/23/21 Potential to Achieve Goals:  Good ADL Goals Pt Will Perform Lower Body Dressing: with modified independence;sit to/from stand Pt Will Transfer to Toilet: with modified independence;ambulating Additional ADL Goal #1: pt will complete bed mobility mod I  Plan Discharge plan remains appropriate    Co-evaluation                 AM-PAC OT "6 Clicks" Daily Activity     Outcome Measure   Help from another person eating meals?: None Help from another person taking care of personal grooming?: None Help from another person toileting, which includes using toliet, bedpan, or urinal?: None Help from another person bathing (including washing, rinsing, drying)?: None Help from another person to put on and taking off regular upper body clothing?: None Help from another person to put on and taking off regular lower body clothing?: None 6 Click Score: 24    End of Session    OT Visit Diagnosis: Unsteadiness on feet (R26.81);Muscle weakness (generalized) (M62.81)   Activity Tolerance Patient limited by lethargy   Patient Left in bed;with call bell/phone within reach   Nurse Communication Mobility status        Time: 7253-6644 OT Time Calculation (min): 16 min  Charges: OT General Charges $OT Visit: 1 Visit OT Treatments $Self Care/Home Management : 8-22 mins  Alfonse Flavors, OTA Acute Rehabilitation Services  Office [phone]   Dewain Penning 11/11/2021, 2:00 PM

## 2021-11-12 ENCOUNTER — Other Ambulatory Visit (HOSPITAL_COMMUNITY): Payer: Self-pay

## 2021-11-12 DIAGNOSIS — I4892 Unspecified atrial flutter: Secondary | ICD-10-CM | POA: Diagnosis not present

## 2021-11-12 LAB — CBC WITH DIFFERENTIAL/PLATELET
Abs Immature Granulocytes: 0.03 10*3/uL (ref 0.00–0.07)
Basophils Absolute: 0.1 10*3/uL (ref 0.0–0.1)
Basophils Relative: 1 %
Eosinophils Absolute: 0.1 10*3/uL (ref 0.0–0.5)
Eosinophils Relative: 2 %
HCT: 35.1 % — ABNORMAL LOW (ref 36.0–46.0)
Hemoglobin: 11.5 g/dL — ABNORMAL LOW (ref 12.0–15.0)
Immature Granulocytes: 1 %
Lymphocytes Relative: 37 %
Lymphs Abs: 2.3 10*3/uL (ref 0.7–4.0)
MCH: 26.4 pg (ref 26.0–34.0)
MCHC: 32.8 g/dL (ref 30.0–36.0)
MCV: 80.5 fL (ref 80.0–100.0)
Monocytes Absolute: 0.6 10*3/uL (ref 0.1–1.0)
Monocytes Relative: 10 %
Neutro Abs: 3.1 10*3/uL (ref 1.7–7.7)
Neutrophils Relative %: 49 %
Platelets: 299 10*3/uL (ref 150–400)
RBC: 4.36 MIL/uL (ref 3.87–5.11)
RDW: 16.9 % — ABNORMAL HIGH (ref 11.5–15.5)
WBC: 6.1 10*3/uL (ref 4.0–10.5)
nRBC: 0 % (ref 0.0–0.2)

## 2021-11-12 LAB — GLUCOSE, CAPILLARY
Glucose-Capillary: 117 mg/dL — ABNORMAL HIGH (ref 70–99)
Glucose-Capillary: 183 mg/dL — ABNORMAL HIGH (ref 70–99)
Glucose-Capillary: 207 mg/dL — ABNORMAL HIGH (ref 70–99)
Glucose-Capillary: 153 mg/dL — ABNORMAL HIGH (ref 70–99)

## 2021-11-12 LAB — COMPREHENSIVE METABOLIC PANEL
ALT: 14 U/L (ref 0–44)
AST: 15 U/L (ref 15–41)
Albumin: 3.1 g/dL — ABNORMAL LOW (ref 3.5–5.0)
Alkaline Phosphatase: 52 U/L (ref 38–126)
Anion gap: 7 (ref 5–15)
BUN: 15 mg/dL (ref 6–20)
CO2: 27 mmol/L (ref 22–32)
Calcium: 8.9 mg/dL (ref 8.9–10.3)
Chloride: 101 mmol/L (ref 98–111)
Creatinine, Ser: 0.65 mg/dL (ref 0.44–1.00)
GFR, Estimated: >60 mL/min (ref >60)
Glucose, Bld: 114 mg/dL — ABNORMAL HIGH (ref 70–99)
Potassium: 4.1 mmol/L (ref 3.5–5.1)
Sodium: 135 mmol/L (ref 135–145)
Total Bilirubin: 0.5 mg/dL (ref 0.3–1.2)
Total Protein: 6.4 g/dL — ABNORMAL LOW (ref 6.5–8.1)

## 2021-11-12 LAB — MAGNESIUM: Magnesium: 1.6 mg/dL — ABNORMAL LOW (ref 1.7–2.4)

## 2021-11-12 LAB — BRAIN NATRIURETIC PEPTIDE: B Natriuretic Peptide: 25.1 pg/mL (ref 0.0–100.0)

## 2021-11-12 MED ORDER — DILTIAZEM HCL ER COATED BEADS 180 MG PO CP24
180.0000 mg | ORAL_CAPSULE | Freq: Every day | ORAL | 0 refills | Status: DC
  Filled 2021-11-12: qty 30, 30d supply, fill #0

## 2021-11-12 MED ORDER — DIVALPROEX SODIUM ER 500 MG PO TB24: 500.0000 mg | ORAL_TABLET | Freq: Every day | ORAL | 0 refills | Status: DC

## 2021-11-12 MED ORDER — MAGNESIUM SULFATE 4 GM/100ML IV SOLN
4.0000 g | Freq: Once | INTRAVENOUS | Status: AC
  Administered 2021-11-12: 4 g via INTRAVENOUS
  Filled 2021-11-12: qty 100

## 2021-11-12 MED ORDER — APIXABAN 5 MG PO TABS
5.0000 mg | ORAL_TABLET | Freq: Two times a day (BID) | ORAL | 0 refills | Status: DC
  Filled 2021-11-12: qty 60, 30d supply, fill #0

## 2021-11-12 NOTE — Discharge Summary (Signed)
[place] KPT:[id number] DOB: 02-27-1961 DOA: 11/08/2021  PCP: Ileene Hutchinson, PA  Admit date: 11/08/2021  Discharge date: 11/16/2021  Admitted From: Home    Disposition: Group home per SW   Recommendations for Outpatient Follow-up:   Follow up with PCP in 1-2 weeks  PCP Please obtain BMP/CBC, 2 view CXR in 1week,  (see Discharge instructions)   PCP Please follow up on the following pending results: CBC, CMP, magnesium levels along with two-view chest x-ray in 7 to 10 days, kindly arrange for outpatient psychiatry and cardiology follow-up in 1 to 2 weeks   Home Health: None   Equipment/Devices: None  Consultations: None  Discharge Condition: Stable    CODE STATUS: Full    Diet Recommendation: Heart Healthy Low Carb    Chief Complaint  Patient presents with   Medical Clearance     Brief history of present illness from the day of admission and additional interim summary    61 y.o. female with medical history significant of schizophrenia, COPD, hyperlipidemia, hypertension, diabetes, alcohol, hypothyroidism, anemia presenting with encephalopathy and hallucinations.   In the ER work-up consistent with acute encephalopathy, AKI along with A-fib RVR.                                                                   Hospital Course    Newly diagnosed likely paroxysmal atrial flutter with RVR with [place] vas 2 score of 2 or higher.   Patient placed on IV Cardizem drip currently on oral Cardizem dose adjusted further on 11/10/2021 for better control, cardiology on board, continue Eliquis for now.  TSH stable, echo nonacute.   Medically stable will look for appropriate facility for discharge her previous facility has no bed for her.   Acute encephalopathy -  Patient presenting from facility after issues with acute  cephalopathy and hallucinations.  She is have history of schizophrenia, EEG, CT head and MRI brain nonacute, CT abdomen pelvis nonacute, no headache or nuchal rigidity, likely mildly dehydrated for which she is getting IV fluids, and by psychiatry this admission.  Per psych she is stable.  On the below mentioned regimen per Psych.  Medications: - Continue reduced home dose of Clozaril 50 qAM and 75 mg qHS in light of suspected anticholinergic overload. - Continue Abilify 10 mg daily as adjunctive therapy to Clozaril - Continue Haldol 10 mg TID PRN agitation or psychotic symptoms. Would like to assess patient's need for dosages.  - Continue home Depakote 500 mg BID - Continue Ingrezza 40 mg for Daily  Discontinued home Cogentin and Lybalvi in light of suspected anticholinergic overload.   AKI -  Elevated 1.17 baseline around 0.5.  Likely due to dehydration hydrate with IV fluids monitor bladder scans.  CT abdomen pelvis nonacute.  This problem has resolved.  Schizophrenia  - per SNF was hallucinating prior to hospital transfer, stable on present regimen.  Outpatient psych follow-up.   COPD - Symbicort with formulary Dulera, continue as needed albuterol   Hypertension  - Hold home antihypertensives currently on diltiazem.   Glaucoma - Continue home eyedrops   Borderline elevation of ammonia.  Resolved after a dose of lactulose.   Chronic right foot discomfort after an injury. Stable X-rays with chronic changes outpatient orthopedic follow-up.   Diabetes -continue Glucophage at home dose. Added Lantus + ISS -  check CBGs q. ACH S.  CBG (last 3)  Recent Labs    11/15/21 2100 11/16/21 0819 11/16/21 1258  GLUCAP 254* 187* 235*    Discharge diagnosis     Principal Problem:   Atrial flutter with rapid ventricular response (HCC) Active Problems:   Schizophrenia (HCC)   COPD (chronic obstructive pulmonary disease) (HCC)   Dyslipidemia   Essential hypertension   Glaucoma   Type  2 diabetes mellitus without complications ([place])   Acute encephalopathy   AKI (acute kidney injury) ([place])    Discharge instructions    Discharge Instructions     Diet - low sodium heart healthy   Complete by: As directed    Discharge instructions   Complete by: As directed    Follow with Primary MD Ileene Hutchinson, PA in 7 days   Get CBC, CMP, magnesium, 2 view Chest X ray -  checked next visit within 1 week by Primary MD    Activity: As tolerated with Full fall precautions use walker/cane & assistance as needed  Disposition monitored facility  Diet: Heart Healthy & Low carbohydrate diet.  Check CBGs q. ACH S  Accuchecks 4 times/day, Once in AM empty stomach and then before each meal. Log in all results and show them to your Prim.MD in 3 days. If any glucose reading is under 80 or above 300 call your Prim MD immidiately. Follow Low glucose instructions for glucose under 80 as instructed.  Special Instructions: If you have smoked or chewed Tobacco  in the last 2 yrs please stop smoking, stop any regular Alcohol  and or any Recreational drug use.  On your next visit with your primary care physician please Get Medicines reviewed and adjusted.  Please request your Prim.MD to go over all Hospital Tests and Procedure/Radiological results at the follow up, please get all Hospital records sent to your Prim MD by signing hospital release before you go home.  If you experience worsening of your admission symptoms, develop shortness of breath, life threatening emergency, suicidal or homicidal thoughts you must seek medical attention immediately by calling 911 or calling your MD immediately  if symptoms less severe.  You Must read complete instructions/literature along with all the possible adverse reactions/side effects for all the Medicines you take and that have been prescribed to you. Take any new Medicines after you have completely understood and accpet all the possible adverse  reactions/side effects.   Increase activity slowly   Complete by: As directed        Discharge Medications   Allergies as of 11/16/2021       Reactions   Penicillins Other (See Comments)   Reaction not known at group home        Medication List     STOP taking these medications    benztropine 1 MG tablet Commonly known as: COGENTIN   lisinopril 10 MG tablet Commonly known as: ZESTRIL   Lybalvi 20-10 MG Tabs Generic  drug: OLANZapine-Samidorphan       TAKE these medications    apixaban 5 MG Tabs tablet Commonly known as: ELIQUIS Take 1 tablet (5 mg total) by mouth 2 (two) times daily.   ARIPiprazole 10 MG tablet Commonly known as: ABILIFY Take 1 tablet (10 mg total) by mouth daily. Start taking on: November 17, 2021   blood glucose meter kit and supplies Kit DM testing supplies/kit ( glucometer, lancets, strips)  for QAC - HS accuchecks. Can give 1 month testing supplies for QA CHS Accu-Cheks of any brand.   cloZAPine 25 MG tablet Commonly known as: CLOZARIL Take 3 tablets (75 mg total) by mouth at bedtime. What changed: You were already taking a medication with the same name, and this prescription was added. Make sure you understand how and when to take each.   clozapine 50 MG tablet Commonly known as: CLOZARIL Take 1 tablet (50 mg total) by mouth daily. Start taking on: November 17, 2021 What changed: when to take this   diltiazem 240 MG 24 hr capsule Commonly known as: CARDIZEM CD Take 1 capsule (240 mg total) by mouth daily. Start taking on: November 17, 2021   divalproex 500 MG 24 hr tablet Commonly known as: DEPAKOTE ER Take 1 tablet (500 mg total) by mouth 2 (two) times daily. What changed: when to take this   haloperidol 10 MG tablet Commonly known as: HALDOL Take 1 tablet (10 mg total) by mouth 3 (three) times daily as needed for agitation (and psychotic symptoms).   Ingrezza 40 MG capsule Generic drug: valbenazine Take 1 capsule  (40 mg total) by mouth in the morning.   insulin aspart 100 UNIT/ML FlexPen Commonly known as: NOVOLOG Before each meal 3 times a day, 140-199 - 2 units, 200-250 - 4 units, 251-299 - 6 units,  300-349 - 8 units,  350 or above 10 units. Insulin PEN if approved, provide syringes and needles if needed.Please switch to any approved short acting Insulin if needed.   insulin glargine-yfgn 100 UNIT/ML injection Commonly known as: SEMGLEE Inject 0.12 mLs (12 Units total) into the skin daily. Start taking on: November 17, 2021   Insulin Syringe-Needle U-100 25G X 1" 1 ML Misc For 4 times a day insulin SQ, 1 month supply. Diagnosis E11.65   latanoprost 0.005 % ophthalmic solution Commonly known as: XALATAN Place 1 drop into both eyes at bedtime.   metFORMIN 500 MG tablet Commonly known as: GLUCOPHAGE Take 500 mg by mouth 2 (two) times daily with a meal.   nicotine 7 mg/24hr patch Commonly known as: NICODERM CQ - dosed in mg/24 hr Place 1 patch (7 mg total) onto the skin daily.   ProAir HFA 108 (90 Base) MCG/ACT inhaler Generic drug: albuterol Inhale 2 puffs into the lungs every 6 (six) hours as needed for wheezing or shortness of breath.   Symbicort 160-4.5 MCG/ACT inhaler Generic drug: budesonide-formoterol Inhale 2 puffs into the lungs in the morning and at bedtime.   Vitamin D3 1.25 MG (50000 UT) Caps Take 50,000 Units by mouth every Thursday.         Follow-up Information     Ileene Hutchinson, [place]. Schedule an appointment as soon as possible for a visit in 1 week(s).   Specialty: Physician Assistant Contact information: [place] [address] [phone]         Berniece Salines, DO. Schedule an appointment as soon as possible for a visit in 2 week(s).   Specialty: Cardiology Contact  information: [address] [place] [address] [phone]                 Major procedures and Radiology Reports - PLEASE review detailed and final  reports thoroughly  -       DG Foot Complete Right  Result Date: 11/10/2021 CLINICAL DATA:  Right foot and ankle pain.  No recent injury EXAM: RIGHT FOOT COMPLETE - 3+ VIEW COMPARISON:  Right foot 10/21/2020 FINDINGS: Mild widening between the first and second metatarsals unchanged. 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 Mild degenerative change in the midfoot with spurring. Mild calcaneal spurring. Chronic periosteal thickening distal tibia and fibula due to chronic injury. IMPRESSION: No acute injury. Degenerative change in midfoot. 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 Electronically Signed   By: Franchot Gallo M.D.   On: 11/10/2021 12:13   DG Ankle Complete Right  Result Date: 11/10/2021 CLINICAL DATA:  Foot and ankle pain.  No recent injury EXAM: RIGHT ANKLE - COMPLETE 3+ VIEW COMPARISON:  Right ankle 09/09/2021 FINDINGS: Normal alignment no acute fracture Chronic periosteal thickening distal tibia and fibula due to chronic injury. This is unchanged. No joint effusion. IMPRESSION: Chronic ankle injury.  No acute fracture Electronically Signed   By: Franchot Gallo M.D.   On: 11/10/2021 12:11   ECHOCARDIOGRAM COMPLETE  Result Date: 11/09/2021    ECHOCARDIOGRAM REPORT   Patient Name:   Teresa Murillo Date of Exam: 11/09/2021 Medical Rec #:  [id number]       Height:       65.0 in Accession #:    [id number]      Weight:       135.0 lb Date of Birth:  22-Jul-1960      BSA:          1.674 m Patient Age:    61 years        BP:           107/63 mmHg Patient Gender: F               HR:           79 bpm. Exam Location:  Inpatient Procedure: 2D Echo, Cardiac Doppler and Color Doppler Indications:    Pericardial Effusion I31.3  History:        Patient has prior history of Echocardiogram examinations, most                 recent 09/10/2021. COPD, Arrythmias:Atrial Flutter; Risk                 Factors:Hypertension, Diabetes and Dyslipidemia.  Sonographer:    Ronny Flurry Referring Phys: [place]  1. A small pericardial effusion is present, circumferential with no evidence of mitral valve respirophasic variation of inflow Doppler. Patient did not complete exam: cannot see IVC. In views obtained, study not suggestive of cardiac tamponade.  2. Left ventricular ejection fraction, by estimation, is 60 to 65%. The left ventricle has normal function. Left ventricular endocardial border not optimally defined to evaluate regional wall motion. There is mild concentric left ventricular hypertrophy. Left ventricular diastolic parameters are consistent with Grade I diastolic dysfunction (impaired relaxation).  3. Right ventricular systolic function is normal. The right ventricular size is normal.  4. Left atrial size was mildly dilated.  5. The mitral valve is degenerative. Mild mitral valve regurgitation. Moderate mitral annular calcification.  6. The aortic valve is tricuspid. There is mild calcification of the aortic valve. Aortic valve regurgitation is  not visualized. Aortic valve sclerosis is present, with no evidence of aortic valve stenosis. Comparison(s): Limited study- pericardial effusion has decreased in size. FINDINGS  Left Ventricle: Left ventricular ejection fraction, by estimation, is 60 to 65%. The left ventricle has normal function. Left ventricular endocardial border not optimally defined to evaluate regional wall motion. The left ventricular internal cavity size was normal in size. There is mild concentric left ventricular hypertrophy. Left ventricular diastolic parameters are consistent with Grade I diastolic dysfunction (impaired relaxation). Right Ventricle: The right ventricular size is normal. No increase in right ventricular wall thickness. Right ventricular systolic function is normal. Left Atrium: Left atrial size was mildly dilated. Right Atrium: Right atrial size was normal in size. Pericardium: A small pericardial effusion is present. Mitral Valve: The mitral valve is  degenerative in appearance. Moderate mitral annular calcification. Mild mitral valve regurgitation. Tricuspid Valve: The tricuspid valve is normal in structure. Tricuspid valve regurgitation is trivial. No evidence of tricuspid stenosis. Aortic Valve: The aortic valve is tricuspid. There is mild calcification of the aortic valve. Aortic valve regurgitation is not visualized. Aortic valve sclerosis is present, with no evidence of aortic valve stenosis. Pulmonic Valve: The pulmonic valve was normal in structure. Pulmonic valve regurgitation is mild. No evidence of pulmonic stenosis. Aorta: The aortic root and ascending aorta are structurally normal, with no evidence of dilitation. IAS/Shunts: No atrial level shunt detected by color flow Doppler.  LEFT VENTRICLE PLAX 2D LVIDd:         3.80 cm   Diastology LVIDs:         2.50 cm   LV e' lateral:   5.44 cm/s LV PW:         1.20 cm   LV E/e' lateral: 21.7 LV IVS:        1.20 cm LVOT diam:     2.00 cm LV SV:         75 LV SV Index:   45 LVOT Area:     3.14 cm  RIGHT VENTRICLE RV S prime:     14.60 cm/s TAPSE (M-mode): 1.6 cm LEFT ATRIUM           Index        RIGHT ATRIUM           Index LA diam:      3.60 cm 2.15 cm/m   RA Area:     12.70 cm LA Vol (A4C): 57.1 ml 34.11 ml/m  RA Volume:   27.90 ml  16.67 ml/m  AORTIC VALVE             PULMONIC VALVE LVOT Vmax:   123.00 cm/s PR End Diast Vel: 6.86 msec LVOT Vmean:  82.500 cm/s LVOT VTI:    0.238 m  AORTA Ao Root diam: 3.10 cm Ao Asc diam:  3.40 cm MITRAL VALVE                TRICUSPID VALVE MV Area (PHT): 3.12 cm     TR Peak grad:   15.2 mmHg MV Decel Time: 243 msec     TR Vmax:        195.00 cm/s MV E velocity: 118.00 cm/s MV A velocity: 185.00 cm/s  SHUNTS MV E/A ratio:  0.64         Systemic VTI:  0.24 m                             Systemic  Diam: 2.00 cm Rudean Haskell MD Electronically signed by Rudean Haskell MD Signature Date/Time: 11/09/2021/4:44:14 PM    Final    MR BRAIN WO CONTRAST  Result  Date: 11/09/2021 CLINICAL DATA:  Presents with encephalopathy and hallucinations. History of schizophrenia, alcoholism, anemia. EXAM: MRI HEAD WITHOUT CONTRAST TECHNIQUE: Multiplanar, multiecho pulse sequences of the brain and surrounding structures were obtained without intravenous contrast. COMPARISON:  CT head 1 day prior FINDINGS: Brain: There is no acute intracranial hemorrhage, extra-axial fluid collection, or acute infarct. Parenchymal volume is normal. The ventricles are normal in size. Gray-white differentiation is preserved. There are scattered small foci of FLAIR signal abnormality in the supratentorial white matter likely reflecting sequela of minimal underlying chronic small vessel ischemic change. There is no suspicious parenchymal signal abnormality. There is no mass lesion. There is no mass effect or midline shift. Vascular: Normal flow voids. Skull and upper cervical spine: There is no suspicious marrow signal abnormality. There is apparent moderate spinal canal stenosis at C2-C3 with possible cord compression. Sinuses/Orbits: The paranasal sinuses are clear. The globes and orbits are unremarkable. Other: None. IMPRESSION: 1. No acute intracranial pathology. 2. 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 Consider cervical spine MRI for better evaluation. Electronically Signed   By: Valetta Mole M.D.   On: 11/09/2021 11:15   EEG adult  Result Date: 11/09/2021 Lora Havens, MD     11/09/2021 11:20 AM Patient Name: Terence Googe MRN: [id number] Epilepsy Attending: Lora Havens Referring Physician/Provider: Thurnell Lose, MD Date: 11/09/2021 Duration: 22.17 mins Patient history: 61 year old female with altered mental status.  EEG to evaluate for seizure. Level of alertness: Awake AEDs during EEG study: None Technical aspects: This EEG study was done with scalp electrodes positioned according to the 10-20 International system of electrode placement. Electrical activity  was reviewed with band pass filter of 1-70Hz, sensitivity of 7 uV/mm, display speed of 41m/sec with a 60Hz notched filter applied as appropriate. EEG data were recorded continuously and digitally stored.  Video monitoring was available and reviewed as appropriate. Description: The posterior dominant rhythm consists of 8 Hz activity of moderate voltage (25-35 uV) seen predominantly in posterior head regions, symmetric and reactive to eye opening and eye closing. EEG showed intermittent generalized 3 to 6 Hz theta-delta slowing. Hyperventilation and photic stimulation were not performed.   ABNORMALITY - Intermittent slow, generalized IMPRESSION: This study is suggestive of mild diffuse encephalopathy, nonspecific etiology. No seizures or epileptiform discharges were seen throughout the recording. Priyanka OBarbra Sarks  CT HEAD WO CONTRAST (5MM)  Result Date: 11/08/2021 CLINICAL DATA:  Hallucinations EXAM: CT HEAD WITHOUT CONTRAST TECHNIQUE: Contiguous axial images were obtained from the base of the skull through the vertex without intravenous contrast. RADIATION DOSE REDUCTION: This exam was performed according to the departmental dose-optimization program which includes automated exposure control, adjustment of the mA and/or kV according to patient size and/or use of iterative reconstruction technique. COMPARISON:  09/09/2021 FINDINGS: Brain: No evidence of acute infarction, hemorrhage, hydrocephalus, extra-axial collection or mass lesion/mass effect. Vascular: Mild intracranial atherosclerosis. Skull: Normal. Negative for fracture or focal lesion. Sinuses/Orbits: The visualized paranasal sinuses are essentially clear. The mastoid air cells are unopacified. Other: None.  Normal head CT. IMPRESSION: Normal head CT. Electronically Signed   By: SJulian HyM.D.   On: 11/08/2021 19:04   CT ABDOMEN PELVIS W CONTRAST  Result Date: 11/08/2021 CLINICAL DATA:  Abdominal pain EXAM: CT ABDOMEN AND PELVIS WITH  CONTRAST TECHNIQUE: Multidetector CT imaging  of the abdomen and pelvis was performed using the standard protocol following bolus administration of intravenous contrast. RADIATION DOSE REDUCTION: This exam was performed according to the departmental dose-optimization program which includes automated exposure control, adjustment of the mA and/or kV according to patient size and/or use of iterative reconstruction technique. CONTRAST:  13m OMNIPAQUE IOHEXOL 300 MG/ML  SOLN COMPARISON:  None Available. FINDINGS: Lower chest: Minimal scarring/atelectasis the lung bases. Trace right pleural fluid. Hepatobiliary: Liver is within normal limits. Gallbladder is decompressed. No intrahepatic or extrahepatic duct dilatation. Pancreas: Within normal limits. Spleen: Within normal limits Adrenals/Urinary Tract: Adrenal glands are within normal limits. Kidneys are within normal limits. Right extrarenal pelvis. No frank hydronephrosis. Bladder is within normal limits. Stomach/Bowel: Stomach is notable for a large hiatal hernia. No evidence of bowel obstruction. Normal appendix (series 3/image 65). No colonic wall thickening or inflammatory changes. Vascular/Lymphatic: No evidence of abdominal aortic aneurysm. Atherosclerotic calcifications of the abdominal aorta and branch vessels. No suspicious abdominopelvic lymphadenopathy. Reproductive: Uterus is within normal limits. Bilateral ovaries are within normal limits. Other: No abdominopelvic ascites. Musculoskeletal: Mild degenerative changes of the lower thoracic spine. IMPRESSION: No CT findings to account for the patient's abdominal pain. Large hiatal hernia. Electronically Signed   By: SJulian HyM.D.   On: 11/08/2021 19:03   DG Chest Portable 1 View  Result Date: 11/08/2021 CLINICAL DATA:  Altered mental status, tachycardia, nausea and vomiting. EXAM: PORTABLE CHEST 1 VIEW COMPARISON:  Chest x-ray dated 06/02/2020 FINDINGS: Grossly stable cardiomegaly. No evidence of  active CHF. No evidence of pneumonia or pulmonary edema. No pleural effusion or pneumothorax is seen. Moderate-sized hiatal hernia. IMPRESSION: 1. No active disease. No evidence of CHF or pneumonia. 2. Stable cardiomegaly. Electronically Signed   By: SFranki CabotM.D.   On: 11/08/2021 17:58    Micro Results     Recent Results (from the past 240 hour(s))  Resp Panel by RT-PCR (Flu A&B, Covid) Anterior Nasal Swab     Status: None   Collection Time: 11/08/21  4:33 PM   Specimen: Anterior Nasal Swab  Result Value Ref Range Status   SARS Coronavirus 2 by RT PCR NEGATIVE NEGATIVE Final    Comment: (NOTE) SARS-CoV-2 target nucleic acids are NOT DETECTED.  The SARS-CoV-2 RNA is generally detectable in upper respiratory specimens during the acute phase of infection. The lowest concentration of SARS-CoV-2 viral copies this assay can detect is 138 copies/mL. A negative result does not preclude SARS-Cov-2 infection and should not be used as the sole basis for treatment or other patient management decisions. A negative result may occur with  improper specimen collection/handling, submission of specimen other than nasopharyngeal swab, presence of viral mutation(s) within the areas targeted by this assay, and inadequate number of viral copies(<138 copies/mL). A negative result must be combined with clinical observations, patient history, and epidemiological information. The expected result is Negative.  Fact Sheet for Patients:  hEntrepreneurPulse.com.au Fact Sheet for Healthcare Providers:  hIncredibleEmployment.be This test is no t yet approved or cleared by the [place] and  has been authorized for detection and/or diagnosis of SARS-CoV-2 by FDA under an Emergency Use Authorization (EUA). This EUA will remain  in effect (meaning this test can be used) for the duration of the COVID-19 declaration under Section 564(b)(1) of the Act,  21 U.S.C.section 360bbb-3(b)(1), unless the authorization is terminated  or revoked sooner.       Influenza A by PCR NEGATIVE NEGATIVE Final   Influenza B by  PCR NEGATIVE NEGATIVE Final    Comment: (NOTE) The Xpert Xpress SARS-CoV-2/FLU/RSV plus assay is intended as an aid in the diagnosis of influenza from Nasopharyngeal swab specimens and should not be used as a sole basis for treatment. Nasal washings and aspirates are unacceptable for Xpert Xpress SARS-CoV-2/FLU/RSV testing.  Fact Sheet for Patients: EntrepreneurPulse.com.au  Fact Sheet for Healthcare Providers: IncredibleEmployment.be  This test is not yet approved or cleared by the [place] FDA and has been authorized for detection and/or diagnosis of SARS-CoV-2 by FDA under an Emergency Use Authorization (EUA). This EUA will remain in effect (meaning this test can be used) for the duration of the COVID-19 declaration under Section 564(b)(1) of the Act, 21 U.S.C. section 360bbb-3(b)(1), unless the authorization is terminated or revoked.  Performed at [facility], [place] [address], [place], [place] [address]   Urine Culture     Status: Abnormal   Collection Time: 11/08/21  9:11 PM   Specimen: Urine, Clean Catch  Result Value Ref Range Status   Specimen Description URINE, CLEAN CATCH  Final   Special Requests   Final    NONE Performed at [facility], [place] [address] [place], [place] [address]    Culture MULTIPLE SPECIES PRESENT, SUGGEST RECOLLECTION (A)  Final   Report Status 11/11/2021 FINAL  Final    Today   Subjective    Teresa Murillo today has no headache,no chest abdominal pain,no new weakness tingling or numbness, feels much better wants to go home today.    Objective   Blood pressure 129/63, pulse 75, temperature 97.8 F (36.6 C), temperature source Oral, resp. rate 18, SpO2 97 %.  No intake or output data in the 24 hours ending 11/16/21  1344   Exam  Awake Alert, No new F.N deficits,    [place].AT,PERRAL Supple Neck,   Symmetrical Chest wall movement, Good air movement bilaterally, CTAB RRR,No Gallops,   +ve B.Sounds, Abd Soft, Non tender,  No Cyanosis, Clubbing or edema    Data Review   Recent Labs  Lab 11/10/21 0421 11/11/21 0952 11/12/21 0251 11/16/21 0553  WBC 6.6 5.6 6.1 5.1  HGB 10.5* 11.2* 11.5* 10.7*  HCT 32.0* 35.0* 35.1* 32.9*  PLT 261 275 299 341  MCV 79.4* 80.8 80.5 81.0  MCH 26.1 25.9* 26.4 26.4  MCHC 32.8 32.0 32.8 32.5  RDW 16.6* 16.9* 16.9* 17.6*  LYMPHSABS 2.6 1.6 2.3  --   MONOABS 0.6 0.6 0.6  --   EOSABS 0.2 0.1 0.1  --   BASOSABS 0.1 0.0 0.1  --     Recent Labs  Lab 11/10/21 0421 11/11/21 0952 11/12/21 0251 11/13/21 0302 11/16/21 0553  NA 135 134* 135  --  138  K 4.3 4.5 4.1  --  4.3  CL 102 99 101  --  104  CO2 _0 --  27  GLUCOSE 136* 152* 114*  --  130*  BUN _1 --  9  CREATININE 0.56 0.59 0.65  --  0.51  CALCIUM 8.8* 9.0 8.9  --  9.2  AST _2 --  19  ALT _3 --  13  ALKPHOS 53 51 52  --  50  BILITOT 0.5 0.5 0.5  --  0.3  ALBUMIN 3.2* 3.1* 3.1*  --  3.2*  MG 1.4* 1.4* 1.6* 1.7 1.7  AMMONIA 28  --   --   --   --   BNP  69.3 52.3 25.1  --   --     Total Time in preparing paper work, data evaluation and todays exam - 35 minutes  Lala Lund M.D on 11/16/2021 at 1:44 PM  [facility]

## 2021-11-12 NOTE — Progress Notes (Signed)
Mobility Specialist Criteria Algorithm Info.   11/12/21 1102  Mobility  Activity Ambulated independently in hallway; Dangled  edge of bed  Range of Motion/Exercises Active;All extremities  Level of Assistance Independent  Assistive Device Other (Comment) (IV Pole)  Distance Ambulated (ft) 100 ft  Activity Response Tolerated well   Patient received at nurses station, ambulated independently with steady gait. Returned to room without complaint or incident. Was left dangling with all needs met, call bell in reach.   Teresa Murillo, Teresa Murillo, Teresa Murillo  FVOHK:[phone] Office: [phone]

## 2021-11-12 NOTE — Plan of Care (Signed)
  Problem: Clinical Measurements: Goal: Respiratory complications will improve Outcome: Progressing Goal: Cardiovascular complication will be avoided Outcome: Progressing   Problem: Activity: Goal: Risk for activity intolerance will decrease Outcome: Progressing   Problem: Nutrition: Goal: Adequate nutrition will be maintained Outcome: Progressing   Problem: Elimination: Goal: Will not experience complications related to urinary retention Outcome: Progressing   Problem: Safety: Goal: Ability to remain free from injury will improve Outcome: Progressing
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
## 2021-11-12 NOTE — Plan of Care (Signed)
Collateral:   Spoke to [employer] [phone]) Concerned that she does not have adequate help with having medications adjusted. Doesn't believe that Clozaril is working because she is drooling, and behaviors worsened since patient started.  She also has concerns about patient's medications being decreased too much.  When patient was last seen at the [facility] behavioral urgent care center, providers d/c her Haldol and Ambien without order.  The last time patient was stable, patient was taking Caplyta, but insurance discontinued, so this is when behaviors started, 4 months ago.  At this time, she is unsure of other medications that have previously worked well.  Patient has now been taking Lybalvi and Clozaril for about 2 months. Patient had an increase in Clozaril to 100 mg BID, two weeks ago.  Josiah Lobo has concerns about patient returning to group home with decrease in medications due to her behaviors.  She says that if [place] were on a consistent regimen, she would like be more amenable to her return.  This writer advised that I will touch base with [name] tomorrow after she is also able to obtain past records and determine what has worked well for [place] in the past.  Lamar Sprinkles, MD PGY-2 11/12/2021  3:43 PM [place] Department of Psychiatry

## 2021-11-12 NOTE — TOC Progression Note (Addendum)
Transition of Care [facility]) - Progression Note    Patient Details  Name: Teresa Murillo MRN: [id number] Date of Birth: 10-08-1960  Transition of Care [facility]) CM/SW Contact  Delilah Shan, LCSWA Phone Number: 11/12/2021, 10:10 AM  Clinical Narrative:     Update-4:17pm -[name] with Group home informed CSW working with medial team in regards to patients medications, so that patient can return back to group home. [name] informed CSW she will update patients care Secretary/administrator with [facility]. CSW updated patient who is in agreement to return to group home when able.  Update- 2:51pm-CSW received call from [name] with [facility] who informed CSW may consider taking patient back. [name] request to speak with psychiatry. [name] has questions regarding patients medications. CSW informed psychiatry.CSW updated patient [name] informed CSW  with Group home working with medical team and plans on her returning back. ClarisPatient and [name] with [facility] updated.  Update 1:49pm- CSW received callback from [facility] patients care manager with [facility] who informed CSW that the only option currently until they find placement is for patient to go to a shelter if agreeable. [name] request to speak with patient to see if agreeable to go to a shelter. CSW spoke with patient at bedside. Patient reports she is agreeable to speak with [name].     Update-11:35am- CSW received call from owner of [facility] care who informed CSW that she spoke with [name], [name]. [name] informed CSW that [place] request for CSW to call him regarding placement for patient.CSW spoke with patient who gave CSW permission to speak with her care manager with [facility] [name].CSW called [name] with [facility] who informed CSW that he is supposed to meet with his supervisor at 1:00pm to discuss placement for patient. [name] confirmed he will give CSW a callback after speaking with his supervisor. CSW will continue to  follow.  CSW LVM for [employer] owner of [employer] care. CSW following up on placement for patient. CSW informed MD. CSW will continue to follow.l  Expected Discharge Plan: Group Home ([facility]) Barriers to Discharge: Continued Medical Work up  Expected Discharge Plan and Services Expected Discharge Plan: Group Home ([facility]) In-house Referral: Clinical Social Work     Living arrangements for the past 2 months: Group Home Expected Discharge Date: 11/12/21                                     Social Determinants of Health (SDOH) Interventions    Readmission Risk Interventions     No data to display

## 2021-11-13 DIAGNOSIS — I4892 Unspecified atrial flutter: Secondary | ICD-10-CM | POA: Diagnosis not present

## 2021-11-13 LAB — GLUCOSE, CAPILLARY
Glucose-Capillary: 110 mg/dL — ABNORMAL HIGH (ref 70–99)
Glucose-Capillary: 145 mg/dL — ABNORMAL HIGH (ref 70–99)
Glucose-Capillary: 238 mg/dL — ABNORMAL HIGH (ref 70–99)
Glucose-Capillary: 189 mg/dL — ABNORMAL HIGH (ref 70–99)

## 2021-11-13 LAB — MAGNESIUM: Magnesium: 1.7 mg/dL (ref 1.7–2.4)

## 2021-11-13 MED ORDER — HALOPERIDOL 5 MG PO TABS: 10.0000 mg | ORAL_TABLET | Freq: Three times a day (TID) | ORAL | Status: DC

## 2021-11-13 MED ORDER — CLOZAPINE 25 MG PO TABS
75.0000 mg | ORAL_TABLET | Freq: Every day | ORAL | Status: DC
  Administered 2021-11-13 – 2021-11-14 (×2): 75 mg via ORAL
  Administered 2021-11-15: 25 mg via ORAL
  Filled 2021-11-13 (×4): qty 3

## 2021-11-13 MED ORDER — HALOPERIDOL 5 MG PO TABS: 10.0000 mg | ORAL_TABLET | Freq: Two times a day (BID) | ORAL | Status: DC

## 2021-11-13 MED ORDER — MAGNESIUM SULFATE 2 GM/50ML IV SOLN
2.0000 g | Freq: Once | INTRAVENOUS | Status: AC
  Administered 2021-11-13: 2 g via INTRAVENOUS
  Filled 2021-11-13: qty 50

## 2021-11-13 MED ORDER — HALOPERIDOL 5 MG PO TABS: 10.0000 mg | ORAL_TABLET | Freq: Three times a day (TID) | ORAL | Status: DC | PRN

## 2021-11-13 MED ORDER — HALOPERIDOL 5 MG PO TABS
10.0000 mg | ORAL_TABLET | Freq: Once | ORAL | Status: DC
  Filled 2021-11-13: qty 2

## 2021-11-13 MED ORDER — DIVALPROEX SODIUM ER 500 MG PO TB24
500.0000 mg | ORAL_TABLET | Freq: Two times a day (BID) | ORAL | Status: DC
  Administered 2021-11-13 – 2021-11-16 (×6): 500 mg via ORAL
  Filled 2021-11-13 (×7): qty 1

## 2021-11-13 MED ORDER — ARIPIPRAZOLE 5 MG PO TABS
10.0000 mg | ORAL_TABLET | Freq: Every day | ORAL | Status: DC
  Administered 2021-11-13 – 2021-11-16 (×4): 10 mg via ORAL
  Filled 2021-11-13 (×5): qty 2

## 2021-11-13 MED ORDER — CLOZAPINE 25 MG PO TABS
50.0000 mg | ORAL_TABLET | Freq: Every day | ORAL | Status: DC
  Administered 2021-11-14 – 2021-11-16 (×3): 50 mg via ORAL
  Filled 2021-11-13 (×3): qty 2

## 2021-11-13 NOTE — TOC Progression Note (Addendum)
Transition of Care [facility]) - Progression Note    Patient Details  Name: Teresa Murillo MRN: [id number] Date of Birth: [birth year]  Transition [facility] Care [facility]) CM/SW Contact  Delilah Shan, LCSWA Phone Number: 11/13/2021, 2:36 PM  Clinical Narrative:     CSW awaiting callback from [name] with [facility]. Dr. Alfonse Flavors informed CSW that [name] with Group home informed her that the Group home's house leadership would discuss medication changes that have been made, and would vote on whether patient was able to return. CSW informed MD,CM, and Brayton Caves with [facility] leadership. CSW will continue to follow and assist with patients dc planning needs.    Expected Discharge Plan: Group Home ([facility]) Barriers to Discharge: Continued Medical Work up  Expected Discharge Plan and Services Expected Discharge Plan: Group Home ([facility]) In-house Referral: Clinical Social Work     Living arrangements for the past 2 months: Group Home Expected Discharge Date: 11/12/21                                     Social Determinants of Health (SDOH) Interventions    Readmission Risk Interventions     No data to display

## 2021-11-13 NOTE — Progress Notes (Signed)
Attestation of resident note 11/13/2021: I have discussed the case and reviewed the note by Dr. Alfonse Flavors, and I am in agreement with the assessment and plan.   In summary, Teresa Murillo is a 61 y.o. female with psychiatric diagnoses of schizophrenia and has a past medical history of COPD, hyperlipidemia, hypertension, diabetes, alcohol, hypothyroidism, anemia.  Psychiatry will continue to follow while reestablishing psychotropic medications for patient's group home.  Currently patient is doing well at a lower dose of Clozaril with addition of Abilify.  I would recommend adding patient's home dose of Haldol 10 mg 3 times daily to be used as needed only.  Psychiatry will continue to monitor usage to determine simplified medication regimen for group home prior to discharge.  Mariel Craft, MD

## 2021-11-13 NOTE — Progress Notes (Signed)
PROGRESS NOTE                                                                                                                                                                                                             Patient Demographics:    Teresa Murillo, is a 61 y.o. female, DOB - 04/07/1960, [id number]  Outpatient Primary MD for the patient is Ileene Hutchinson, [place]    LOS - 4  Admit date - 11/08/2021    Chief Complaint  Patient presents with   Medical Clearance       Brief Narrative (HPI from H&P)  61 y.o. female with medical history significant of schizophrenia, COPD, hyperlipidemia, hypertension, diabetes, alcohol, hypothyroidism, anemia presenting with encephalopathy and hallucinations.  In the ER work-up consistent with acute encephalopathy, AKI along with A-fib RVR.   Subjective:   Patient in bed, appears comfortable, denies any headache, no fever, no chest pain or pressure, no shortness of breath , no abdominal pain. No new focal weakness.   Assessment  & Plan :   Patient is medically stable for discharge await appropriate bed.  Since the discharge summary clozapine night dose has been changed from 50 mg to 75 mcg would need to be adjusted on final discharge.      Newly diagnosed likely paroxysmal atrial flutter with RVR with [place] vas 2 score of 2 or higher.   Patient placed on IV Cardizem drip currently on oral Cardizem dose adjusted further on 11/10/2021 for better control, cardiology on board, continue Eliquis for now.  TSH stable, echo nonacute.   Medically stable will look for appropriate facility for discharge her previous facility has no bed for her.   Acute encephalopathy -  Patient presenting from facility after issues with acute cephalopathy and hallucinations.  She is have history of schizophrenia, EEG, CT head and MRI brain nonacute, CT abdomen pelvis nonacute, no headache or nuchal rigidity,  likely mildly dehydrated for which she is getting IV fluids, and by psychiatry this admission.  Per psych she is stable.  On following regimen.  -- Clozapine 50 mg in the morning, 75 mg at night -- Depakote 500 mg QD -- Ingrezza 40   -- do not see indication for olanzapine at this time.    AKI -  Elevated 1.17 baseline around 0.5.  Likely due to dehydration  hydrate with IV fluids monitor bladder scans.  CT abdomen pelvis nonacute.   Schizophrenia  - per SNF was hallucinating prior to hospital transfer, have consulted psych to assist with schizophrenia medications and management.  COPD - Symbicort with formulary Dulera, continue as needed albuterol   Hypertension  - Hold home antihypertensives currently on diltiazem.   Glaucoma - Continue home eyedrops   Borderline elevation of ammonia.  Placed on lactulose.    Chronic right foot discomfort after an injury. Stable X-rays with chronic changes outpatient orthopedic follow-up.  Diabetes - SSI    Lab Results  Component Value Date   HGBA1C 7.0 (H) 10/04/2021   CBG (last 3)  Recent Labs    11/12/21 1601 11/12/21 2000 11/13/21 0740  GLUCAP 207* 153* 110*         Condition - Fair  Family Communication  : None present.  Code Status :  Full  Consults  :  Psych, Cards  PUD Prophylaxis : PPI   Procedures  :     TTE - 1. A small pericardial effusion is present, circumferential with no evidence of mitral valve respirophasic variation of inflow Doppler. Patient did not complete exam: cannot see IVC. In views obtained, study not suggestive of cardiac tamponade.  2. Left ventricular ejection fraction, by estimation, is 60 to 65%. The left ventricle has normal function. Left ventricular endocardial border not optimally defined to evaluate regional wall motion. There is mild concentric left ventricular hypertrophy. Left ventricular diastolic parameters are consistent with Grade I diastolic dysfunction (impaired relaxation).  3. Right  ventricular systolic function is normal. The right ventricular size is normal.  4. Left atrial size was mildly dilated.  5. The mitral valve is degenerative. Mild mitral valve regurgitation. Moderate mitral annular calcification.  6. The aortic valve is tricuspid. There is mild calcification of the aortic valve. Aortic valve regurgitation is not visualized. Aortic valve sclerosis is present, with no evidence of aortic valve stenosis. Comparison(s): Limited study- pericardial effusion has decreased in size.  EEG - Non Acute  CT head and MRI brain.  Both nonacute.  CT scan abdomen pelvis.  Nonacute.      Disposition Plan  :    Status is: Observation  DVT Prophylaxis  :     apixaban (ELIQUIS) tablet 5 mg   Lab Results  Component Value Date   PLT 299 11/12/2021    Diet :  Diet Order             Diet - low sodium heart healthy           Diet heart healthy/carb modified Room service appropriate? Yes; Fluid consistency: Thin  Diet effective now                    Inpatient Medications  Scheduled Meds:  apixaban  5 mg Oral BID   cloZAPine  50 mg Oral BID   diltiazem  180 mg Oral Daily   divalproex  500 mg Oral Daily   insulin aspart  0-9 Units Subcutaneous TID WC   lactulose  10 g Oral Daily   latanoprost  1 drop Both Eyes QHS   mometasone-formoterol  2 puff Inhalation BID   sodium chloride flush  3 mL Intravenous Q12H   valbenazine  40 mg Oral Daily   Continuous Infusions:  magnesium sulfate bolus IVPB      PRN Meds:.acetaminophen **OR** acetaminophen, albuterol, diltiazem, guaiFENesin-dextromethorphan, hydrALAZINE, polyethylene glycol  Antibiotics  :    Anti-infectives (  From admission, onward)    None        Time Spent in minutes  30   Lala Lund M.D on 11/13/2021 at 8:30 AM  To page go to [email or web address]   [facility] -  Office  [phone]  See all Orders from today for further details    Objective:   Vitals:   11/12/21 2006  11/12/21 2120 11/12/21 2355 11/13/21 0743  BP: 117/62  (!) 110/59 94/64  Pulse: 84  90 68  Resp: 18  20 20   Temp: 98.4 F (36.9 C)  97.9 F (36.6 C) 97.6 F (36.4 C)  TempSrc: Oral  Oral Oral  SpO2:  99%      Wt Readings from Last 3 Encounters:  08/22/21 61.2 kg  11/01/20 68 kg  06/02/20 69.4 kg     Intake/Output Summary (Last 24 hours) at 11/13/2021 0830 Last data filed at 11/13/2021 0829 Gross per 24 hour  Intake 1440 ml  Output --  Net 1440 ml     Physical Exam  Awake Alert, No new F.N deficits, Normal affect [place].AT,PERRAL Supple Neck, No JVD,   Symmetrical Chest wall movement, Good air movement bilaterally, CTAB RRR,No Gallops, Rubs or new Murmurs,  +ve B.Sounds, Abd Soft, No tenderness,   No Cyanosis, Clubbing or edema     Data Review:    CBC Recent Labs  Lab 11/08/21 1048 11/08/21 1858 11/09/21 0451 11/10/21 0421 11/11/21 0952 11/12/21 0251  WBC 7.7  --  6.9 6.6 5.6 6.1  HGB 12.3 12.2 11.2* 10.5* 11.2* 11.5*  HCT 38.2 36.0 34.0* 32.0* 35.0* 35.1*  PLT 286  --  255 261 275 299  MCV 80.8  --  80.0 79.4* 80.8 80.5  MCH 26.0  --  26.4 26.1 25.9* 26.4  MCHC 32.2  --  32.9 32.8 32.0 32.8  RDW 16.7*  --  16.8* 16.6* 16.9* 16.9*  LYMPHSABS 1.2  --   --  2.6 1.6 2.3  MONOABS 0.7  --   --  0.6 0.6 0.6  EOSABS 0.1  --   --  0.2 0.1 0.1  BASOSABS 0.0  --   --  0.1 0.0 0.1    Electrolytes Recent Labs  Lab 11/08/21 1048 11/08/21 1718 11/08/21 1730 11/08/21 1858 11/08/21 2118 11/09/21 0451 11/10/21 0421 11/11/21 0952 11/12/21 0251 11/13/21 0302  NA 138  --   --  135  --  133* 135 134* 135  --   K 4.1  --   --  3.9  --  4.0 4.3 4.5 4.1  --   CL 104  --   --   --   --  100 102 99 101  --   CO2 23  --   --   --   --  24 25 27 27   --   GLUCOSE 129*  --   --   --   --  110* 136* 152* 114*  --   BUN 16  --   --   --   --  7 6 9 15   --   CREATININE 1.17*  --   --   --   --  0.52 0.56 0.59 0.65  --   CALCIUM 9.3  --   --   --   --  8.5* 8.8* 9.0 8.9   --   AST 20  --   --   --   --  15 17 16 15   --   ALT 15  --   --   --   --  12 12 12 14   --   ALKPHOS 65  --   --   --   --  52 53 51 52  --   BILITOT 0.5  --   --   --   --  0.7 0.5 0.5 0.5  --   ALBUMIN 3.7  --   --   --   --  3.0* 3.2* 3.1* 3.1*  --   MG  --   --  1.7  --   --   --  1.4* 1.4* 1.6* 1.7  TSH  --   --  1.293  --   --   --   --   --   --   --   AMMONIA  --  49*  --   --   --   --  28  --   --   --   BNP  --   --   --   --  21.1  --  69.3 52.3 25.1  --     ------------------------------------------------------------------------------------------------------------------ No results for input(s): "CHOL", "HDL", "LDLCALC", "TRIG", "CHOLHDL", "LDLDIRECT" in the last 72 hours.  Lab Results  Component Value Date   HGBA1C 7.0 (H) 10/04/2021    No results for input(s): "TSH", "T4TOTAL", "T3FREE", "THYROIDAB" in the last 72 hours.  Invalid input(s): "FREET3"  ------------------------------------------------------------------------------------------------------------------ ID Labs Recent Labs  Lab 11/08/21 1048 11/09/21 0451 11/10/21 0421 11/11/21 0952 11/12/21 0251  WBC 7.7 6.9 6.6 5.6 6.1  PLT 286 255 261 275 299  CREATININE 1.17* 0.52 0.56 0.59 0.65   Cardiac Enzymes No results for input(s): "CKMB", "TROPONINI", "MYOGLOBIN" in the last 168 hours.  Invalid input(s): "CK"   Radiology Reports DG Foot Complete Right  Result Date: 11/10/2021 CLINICAL DATA:  Right foot and ankle pain.  No recent injury EXAM: RIGHT FOOT COMPLETE - 3+ VIEW COMPARISON:  Right foot 10/21/2020 FINDINGS: Mild widening between the first and second metatarsals unchanged. 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 Mild degenerative change in the midfoot with spurring. Mild calcaneal spurring. Chronic periosteal thickening distal tibia and fibula due to chronic injury. IMPRESSION: No acute injury. Degenerative change in midfoot. 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 Electronically Signed   By: 10/23/2020  M.D.   On: 11/10/2021 12:13   DG Ankle Complete Right  Result Date: 11/10/2021 CLINICAL DATA:  Foot and ankle pain.  No recent injury EXAM: RIGHT ANKLE - COMPLETE 3+ VIEW COMPARISON:  Right ankle 09/09/2021 FINDINGS: Normal alignment no acute fracture Chronic periosteal thickening distal tibia and fibula due to chronic injury. This is unchanged. No joint effusion. IMPRESSION: Chronic ankle injury.  No acute fracture Electronically Signed   By: 11/10/2021 M.D.   On: 11/10/2021 12:11   ECHOCARDIOGRAM COMPLETE  Result Date: 11/09/2021    ECHOCARDIOGRAM REPORT   Patient Name:   Teresa Murillo Date of Exam: 11/09/2021 Medical Rec #:  01/09/2022       Height:       65.0 in Accession #:    [id number]      Weight:       135.0 lb Date of Birth:  04-15-1960      BSA:          1.674 m Patient Age:    60 years        BP:           107/63 mmHg Patient Gender: F               HR:  79 bpm. Exam Location:  Inpatient Procedure: 2D Echo, Cardiac Doppler and Color Doppler Indications:    Pericardial Effusion I31.3  History:        Patient has prior history of Echocardiogram examinations, most                 recent 09/10/2021. COPD, Arrythmias:Atrial Flutter; Risk                 Factors:Hypertension, Diabetes and Dyslipidemia.  Sonographer:    Lucendia Herrlich Referring Phys: [phone] LINDSAY B ROBERTS IMPRESSIONS  1. A small pericardial effusion is present, circumferential with no evidence of mitral valve respirophasic variation of inflow Doppler. Patient did not complete exam: cannot see IVC. In views obtained, study not suggestive of cardiac tamponade.  2. Left ventricular ejection fraction, by estimation, is 60 to 65%. The left ventricle has normal function. Left ventricular endocardial border not optimally defined to evaluate regional wall motion. There is mild concentric left ventricular hypertrophy. Left ventricular diastolic parameters are consistent with Grade I diastolic dysfunction (impaired relaxation).   3. Right ventricular systolic function is normal. The right ventricular size is normal.  4. Left atrial size was mildly dilated.  5. The mitral valve is degenerative. Mild mitral valve regurgitation. Moderate mitral annular calcification.  6. The aortic valve is tricuspid. There is mild calcification of the aortic valve. Aortic valve regurgitation is not visualized. Aortic valve sclerosis is present, with no evidence of aortic valve stenosis. Comparison(s): Limited study- pericardial effusion has decreased in size. FINDINGS  Left Ventricle: Left ventricular ejection fraction, by estimation, is 60 to 65%. The left ventricle has normal function. Left ventricular endocardial border not optimally defined to evaluate regional wall motion. The left ventricular internal cavity size was normal in size. There is mild concentric left ventricular hypertrophy. Left ventricular diastolic parameters are consistent with Grade I diastolic dysfunction (impaired relaxation). Right Ventricle: The right ventricular size is normal. No increase in right ventricular wall thickness. Right ventricular systolic function is normal. Left Atrium: Left atrial size was mildly dilated. Right Atrium: Right atrial size was normal in size. Pericardium: A small pericardial effusion is present. Mitral Valve: The mitral valve is degenerative in appearance. Moderate mitral annular calcification. Mild mitral valve regurgitation. Tricuspid Valve: The tricuspid valve is normal in structure. Tricuspid valve regurgitation is trivial. No evidence of tricuspid stenosis. Aortic Valve: The aortic valve is tricuspid. There is mild calcification of the aortic valve. Aortic valve regurgitation is not visualized. Aortic valve sclerosis is present, with no evidence of aortic valve stenosis. Pulmonic Valve: The pulmonic valve was normal in structure. Pulmonic valve regurgitation is mild. No evidence of pulmonic stenosis. Aorta: The aortic root and ascending aorta are  structurally normal, with no evidence of dilitation. IAS/Shunts: No atrial level shunt detected by color flow Doppler.  LEFT VENTRICLE PLAX 2D LVIDd:         3.80 cm   Diastology LVIDs:         2.50 cm   LV e' lateral:   5.44 cm/s LV PW:         1.20 cm   LV E/e' lateral: 21.7 LV IVS:        1.20 cm LVOT diam:     2.00 cm LV SV:         75 LV SV Index:   45 LVOT Area:     3.14 cm  RIGHT VENTRICLE RV S prime:     14.60 cm/s TAPSE (M-mode): 1.6 cm LEFT  ATRIUM           Index        RIGHT ATRIUM           Index LA diam:      3.60 cm 2.15 cm/m   RA Area:     12.70 cm LA Vol (A4C): 57.1 ml 34.11 ml/m  RA Volume:   27.90 ml  16.67 ml/m  AORTIC VALVE             PULMONIC VALVE LVOT Vmax:   123.00 cm/s PR End Diast Vel: 6.86 msec LVOT Vmean:  82.500 cm/s LVOT VTI:    0.238 m  AORTA Ao Root diam: 3.10 cm Ao Asc diam:  3.40 cm MITRAL VALVE                TRICUSPID VALVE MV Area (PHT): 3.12 cm     TR Peak grad:   15.2 mmHg MV Decel Time: 243 msec     TR Vmax:        195.00 cm/s MV E velocity: 118.00 cm/s MV A velocity: 185.00 cm/s  SHUNTS MV E/A ratio:  0.64         Systemic VTI:  0.24 m                             Systemic Diam: 2.00 cm Rudean Haskell MD Electronically signed by Rudean Haskell MD Signature Date/Time: 11/09/2021/4:44:14 PM    Final    MR BRAIN WO CONTRAST  Result Date: 11/09/2021 CLINICAL DATA:  Presents with encephalopathy and hallucinations. History of schizophrenia, alcoholism, anemia. EXAM: MRI HEAD WITHOUT CONTRAST TECHNIQUE: Multiplanar, multiecho pulse sequences of the brain and surrounding structures were obtained without intravenous contrast. COMPARISON:  CT head 1 day prior FINDINGS: Brain: There is no acute intracranial hemorrhage, extra-axial fluid collection, or acute infarct. Parenchymal volume is normal. The ventricles are normal in size. Gray-white differentiation is preserved. There are scattered small foci of FLAIR signal abnormality in the supratentorial white matter  likely reflecting sequela of minimal underlying chronic small vessel ischemic change. There is no suspicious parenchymal signal abnormality. There is no mass lesion. There is no mass effect or midline shift. Vascular: Normal flow voids. Skull and upper cervical spine: There is no suspicious marrow signal abnormality. There is apparent moderate spinal canal stenosis at C2-C3 with possible cord compression. Sinuses/Orbits: The paranasal sinuses are clear. The globes and orbits are unremarkable. Other: None. IMPRESSION: 1. No acute intracranial pathology. 2. 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 Consider cervical spine MRI for better evaluation. Electronically Signed   By: Valetta Mole M.D.   On: 11/09/2021 11:15   EEG adult  Result Date: 11/09/2021 Lora Havens, MD     11/09/2021 11:20 AM Patient Name: Teresa Murillo MRN: [id number] Epilepsy Attending: Lora Havens Referring Physician/Provider: Thurnell Lose, MD Date: 11/09/2021 Duration: 22.17 mins Patient history: 61 year old female with altered mental status.  EEG to evaluate for seizure. Level of alertness: Awake AEDs during EEG study: None Technical aspects: This EEG study was done with scalp electrodes positioned according to the 10-20 International system of electrode placement. Electrical activity was reviewed with band pass filter of 1-70Hz , sensitivity of 7 uV/mm, display speed of 72mm/sec with a 60Hz  notched filter applied as appropriate. EEG data were recorded continuously and digitally stored.  Video monitoring was available and reviewed as appropriate. Description: The posterior dominant rhythm consists of 8 Hz  activity of moderate voltage (25-35 uV) seen predominantly in posterior head regions, symmetric and reactive to eye opening and eye closing. EEG showed intermittent generalized 3 to 6 Hz theta-delta slowing. Hyperventilation and photic stimulation were not performed.   ABNORMALITY - Intermittent slow,  generalized IMPRESSION: This study is suggestive of mild diffuse encephalopathy, nonspecific etiology. No seizures or epileptiform discharges were seen throughout the recording. Priyanka Barbra Sarks

## 2021-11-13 NOTE — Progress Notes (Signed)
Paged Dr. Toniann Fail regarding need for sliding scale insulin order for bedtime. He asked to to obtain CBG and let him know. CBG was 189. He advised to not give any coverage for that CBG tonight. Will continue to monitor.

## 2021-11-13 NOTE — Consult Note (Cosign Needed Addendum)
Brief Psychiatry Consult Note   Date of service: November 13, 2021 Patient Name: Teresa Murillo DOB: [birth year] MRN: [id number] Reason for consult: "AMS, Hallucinations" Requesting Provider: Leroy Sea, MD  Teresa Murillo is a 61 y.o. female admitted medically for 11/08/2021  9:56 AM for a-fib with RVR. She is now medically cleared for discharge awaiting group home placement. She carries the psychiatric diagnoses of schizophrenia and has a past medical history of COPD, hyperlipidemia, hypertension, diabetes, alcohol, hypothyroidism, anemia.  Collateral: Spoke to [employer] [phone]): Pharmacy records, last dispensed 10/22/21: Lybalvi 20-10 mg QD Benztropine 1 mg BID Depakote 500 mg BID Clozapine 100 mg BID Haloperidol 10 mg TID Ingrezza 40 mg   Patient is still seeing Dr. Guss Murillo outpatient; Teresa Murillo spoke with him today, as she had concerns about polypharmacy and adverse effects of Clozaril. She was informed that we have been unable to reach Dr. Guss Murillo. Clozaril was increased to 100 mg twice daily 8/29.  She says that patient had moderate to severe sialorrhea, and still had behavioral problems.  She has been working with their office and pharmacy to switch back to [facility] on which patient previously did well.  We discussed medications here in the hospital, and how patient is currently taking Clozaril 50 mg every morning and 75 mg nightly, Depakote 500 mg twice daily, Ingrezza 40 mg daily, and now has Abilify 10 mg daily added as adjunct.  Teresa Murillo voiced concerns about patient's Haldol being discontinued, and she was advised that this medication was not a part of her home regimen upon pharmacy's med reconciliation, so it was not given, and patient has not had behavioral issues while admitted.  This Clinical research associate suggested that [employer] or someone from the group home visit Teresa Murillo to give [place] an indicator of how patient does with someone familiar, and she agreed if she were able to find the  time to do so.  Teresa Murillo informed that the house leadership would discuss medication changes that have been made, and would vote on whether patient was able to return.  Received a call from [facility], and spoke to [employer] from Dr. Ranelle Murillo office. She voiced concerns about Teresa Murillo's medications being changed. As well, she said that no one from the hospital reached out to their office to obtain patient's medical records of which I informed her that members of my team informed that they previously attempted to call. She said that patient "Doesn't do well on Abilify," last time it was prescribed, was in 2022, but she is unsure of the reason as to why patient does not do well. (Per our EMR, patient was prescribed Abilify 05/2020- 08/2021 with Abilify Maintena LAI discontinued 09/2021) She confirmed patient's medication list as given by Teresa Murillo.   Attempted to speak to Dr. Guss Murillo, [phone]): VM left.   Assessment and recommendations: It is possible that symptoms and behaviors were partially related to anticholinergic overload.  Thus, Cogentin and Lybalvi were discontinued. She does have some residual hallucinations/psychosis; a recent review suggests abilify + clozapine as combo most likely to work. As no information yet available about patient's Abilify response and if this were the case with monotherapy, vs adjunctive therapy, will continue Abilify at this time.   Medications: - Continue reduced home dose of Clozaril 50 qAM and 75 mg qHS in light of suspected anticholinergic overload. - START Abilify 10 mg daily as adjunctive therapy to Clozaril - RESTART Haldol 10 mg, but TID PRN agitation or psychotic symptoms. Would like to assess patient's  need for dosages.  - Resume home Depakote 500 mg BID - Continue Ingrezza 40 mg for TD - Previously discontinued home Cogentin and Lybalvi in light of suspected anticholinergic overload.  Most recent QTc 432 on 11/08/21.  At this time,  psychiatry team will continue to follow.  Signed: Lamar Sprinkles, MD Psychiatry Resident, PGY-2 [facility] 11/13/2021, 10:29 AM

## 2021-11-14 DIAGNOSIS — I4892 Unspecified atrial flutter: Secondary | ICD-10-CM | POA: Diagnosis not present

## 2021-11-14 LAB — GLUCOSE, CAPILLARY
Glucose-Capillary: 221 mg/dL — ABNORMAL HIGH (ref 70–99)
Glucose-Capillary: 224 mg/dL — ABNORMAL HIGH (ref 70–99)
Glucose-Capillary: 210 mg/dL — ABNORMAL HIGH (ref 70–99)
Glucose-Capillary: 207 mg/dL — ABNORMAL HIGH (ref 70–99)

## 2021-11-14 NOTE — Progress Notes (Signed)
PROGRESS NOTE                                                                                                                                                                                                             Patient Demographics:    Teresa Murillo, is a 61 y.o. female, DOB - [birth year], YM:[id number]  Outpatient Primary MD for the patient is Ileene Hutchinson, [place]    LOS - 5  Admit date - 11/08/2021    Chief Complaint  Patient presents with   Medical Clearance       Brief Narrative (HPI from H&P)  61 y.o. female with medical history significant of schizophrenia, COPD, hyperlipidemia, hypertension, diabetes, alcohol, hypothyroidism, anemia presenting with encephalopathy and hallucinations.  In the ER work-up consistent with acute encephalopathy, AKI along with A-fib RVR.   Subjective:   Patient in bed, appears comfortable, denies any headache, no fever, no chest pain or pressure, no shortness of breath , no abdominal pain. No new focal weakness.   Assessment  & Plan :   Patient is medically stable for discharge await appropriate bed.  Since the discharge summary like has changed her psychiatric medications further, they will need to be reconciled again prior to final discharge.      Newly diagnosed likely paroxysmal atrial flutter with RVR with [place] vas 2 score of 2 or higher.   Patient placed on IV Cardizem drip currently on oral Cardizem dose adjusted further on 11/10/2021 for better control, cardiology on board, continue Eliquis for now.  TSH stable, echo nonacute.   Medically stable will look for appropriate facility for discharge her previous facility has no bed for her.   Acute encephalopathy -  Patient presenting from facility after issues with acute cephalopathy and hallucinations.  She is have history of schizophrenia, EEG, CT head and MRI brain nonacute, CT abdomen pelvis nonacute, no headache or nuchal  rigidity, likely mildly dehydrated for which she is getting IV fluids, and by psychiatry this admission.  Per psych she is stable.  On following regimen.  - Continue reduced home dose of Clozaril 50 qAM and 75 mg qHS in light of suspected anticholinergic overload. - START Abilify 10 mg daily as adjunctive therapy to Clozaril - RESTART Haldol 10 mg, but TID PRN agitation or psychotic symptoms. Would like to assess patient's need for  dosages.  - Resume home Depakote 500 mg BID - Continue Ingrezza 40 mg for TD - Previously discontinued home Cogentin and Lybalvi in light of suspected anticholinergic overload.   AKI -  Elevated 1.17 baseline around 0.5.  Likely due to dehydration hydrate with IV fluids monitor bladder scans.  CT abdomen pelvis nonacute.   Schizophrenia  - per SNF was hallucinating prior to hospital transfer, have consulted psych to assist with schizophrenia medications and management.  COPD - Symbicort with formulary Dulera, continue as needed albuterol   Hypertension  - Hold home antihypertensives currently on diltiazem.   Glaucoma - Continue home eyedrops   Borderline elevation of ammonia.  Placed on lactulose.    Chronic right foot discomfort after an injury. Stable X-rays with chronic changes outpatient orthopedic follow-up.  Diabetes - SSI    Lab Results  Component Value Date   HGBA1C 7.0 (H) 10/04/2021   CBG (last 3)  Recent Labs    11/13/21 1603 11/13/21 2018 11/14/21 0834  GLUCAP 238* 189* 221*         Condition - Fair  Family Communication  : None present.  Code Status :  Full  Consults  :  Psych, Cards  PUD Prophylaxis : PPI   Procedures  :     TTE - 1. A small pericardial effusion is present, circumferential with no evidence of mitral valve respirophasic variation of inflow Doppler. Patient did not complete exam: cannot see IVC. In views obtained, study not suggestive of cardiac tamponade.  2. Left ventricular ejection fraction, by  estimation, is 60 to 65%. The left ventricle has normal function. Left ventricular endocardial border not optimally defined to evaluate regional wall motion. There is mild concentric left ventricular hypertrophy. Left ventricular diastolic parameters are consistent with Grade I diastolic dysfunction (impaired relaxation).  3. Right ventricular systolic function is normal. The right ventricular size is normal.  4. Left atrial size was mildly dilated.  5. The mitral valve is degenerative. Mild mitral valve regurgitation. Moderate mitral annular calcification.  6. The aortic valve is tricuspid. There is mild calcification of the aortic valve. Aortic valve regurgitation is not visualized. Aortic valve sclerosis is present, with no evidence of aortic valve stenosis. Comparison(s): Limited study- pericardial effusion has decreased in size.  EEG - Non Acute  CT head and MRI brain.  Both nonacute.  CT scan abdomen pelvis.  Nonacute.      Disposition Plan  :    Status is: Observation  DVT Prophylaxis  :     apixaban (ELIQUIS) tablet 5 mg   Lab Results  Component Value Date   PLT 299 11/12/2021    Diet :  Diet Order             Diet - low sodium heart healthy           Diet heart healthy/carb modified Room service appropriate? Yes; Fluid consistency: Thin  Diet effective now                    Inpatient Medications  Scheduled Meds:  apixaban  5 mg Oral BID   ARIPiprazole  10 mg Oral Daily   cloZAPine  50 mg Oral Daily   cloZAPine  75 mg Oral QHS   diltiazem  180 mg Oral Daily   divalproex  500 mg Oral BID   insulin aspart  0-9 Units Subcutaneous TID WC   lactulose  10 g Oral Daily   latanoprost  1 drop Both  Eyes QHS   mometasone-formoterol  2 puff Inhalation BID   sodium chloride flush  3 mL Intravenous Q12H   valbenazine  40 mg Oral Daily   Continuous Infusions:    PRN Meds:.acetaminophen **OR** acetaminophen, albuterol, diltiazem, guaiFENesin-dextromethorphan,  haloperidol **FOLLOWED BY** [DISCONTINUED] haloperidol **FOLLOWED BY** [DISCONTINUED] haloperidol, hydrALAZINE, polyethylene glycol  Antibiotics  :    Anti-infectives (From admission, onward)    None        Time Spent in minutes  30   Lala Lund M.D on 11/14/2021 at 8:48 AM  To page go to [email or web address]   [facility] -  Office  [phone]  See all Orders from today for further details    Objective:   Vitals:   11/13/21 2021 11/14/21 0108 11/14/21 0316 11/14/21 0809  BP: 115/69 126/69 (!) 147/81   Pulse: 94 90 96   Resp: 19 20 20    Temp: 97.7 F (36.5 C) 98.1 F (36.7 C) 98.4 F (36.9 C)   TempSrc: Oral Oral Oral   SpO2:    96%    Wt Readings from Last 3 Encounters:  08/22/21 61.2 kg  11/01/20 68 kg  06/02/20 69.4 kg     Intake/Output Summary (Last 24 hours) at 11/14/2021 0848 Last data filed at 11/14/2021 0300 Gross per 24 hour  Intake 796.09 ml  Output --  Net 796.09 ml     Physical Exam  Awake Alert, No new F.N deficits, Normal affect [place].AT,PERRAL Supple Neck, No JVD,   Symmetrical Chest wall movement, Good air movement bilaterally, CTAB RRR,No Gallops, Rubs or new Murmurs,  +ve B.Sounds, Abd Soft, No tenderness,   No Cyanosis, Clubbing or edema      Data Review:    CBC Recent Labs  Lab 11/08/21 1048 11/08/21 1858 11/09/21 0451 11/10/21 0421 11/11/21 0952 11/12/21 0251  WBC 7.7  --  6.9 6.6 5.6 6.1  HGB 12.3 12.2 11.2* 10.5* 11.2* 11.5*  HCT 38.2 36.0 34.0* 32.0* 35.0* 35.1*  PLT 286  --  255 261 275 299  MCV 80.8  --  80.0 79.4* 80.8 80.5  MCH 26.0  --  26.4 26.1 25.9* 26.4  MCHC 32.2  --  32.9 32.8 32.0 32.8  RDW 16.7*  --  16.8* 16.6* 16.9* 16.9*  LYMPHSABS 1.2  --   --  2.6 1.6 2.3  MONOABS 0.7  --   --  0.6 0.6 0.6  EOSABS 0.1  --   --  0.2 0.1 0.1  BASOSABS 0.0  --   --  0.1 0.0 0.1    Electrolytes Recent Labs  Lab 11/08/21 1048 11/08/21 1718 11/08/21 1730 11/08/21 1858 11/08/21 2118 11/09/21 0451  11/10/21 0421 11/11/21 0952 11/12/21 0251 11/13/21 0302  NA 138  --   --  135  --  133* 135 134* 135  --   K 4.1  --   --  3.9  --  4.0 4.3 4.5 4.1  --   CL 104  --   --   --   --  100 102 99 101  --   CO2 23  --   --   --   --  24 25 27 27   --   GLUCOSE 129*  --   --   --   --  110* 136* 152* 114*  --   BUN 16  --   --   --   --  7 6 9 15   --   CREATININE 1.17*  --   --   --   --  0.52 0.56 0.59 0.65  --   CALCIUM 9.3  --   --   --   --  8.5* 8.8* 9.0 8.9  --   AST 20  --   --   --   --  15 17 16 15   --   ALT 15  --   --   --   --  12 12 12 14   --   ALKPHOS 65  --   --   --   --  52 53 51 52  --   BILITOT 0.5  --   --   --   --  0.7 0.5 0.5 0.5  --   ALBUMIN 3.7  --   --   --   --  3.0* 3.2* 3.1* 3.1*  --   MG  --   --  1.7  --   --   --  1.4* 1.4* 1.6* 1.7  TSH  --   --  1.293  --   --   --   --   --   --   --   AMMONIA  --  49*  --   --   --   --  28  --   --   --   BNP  --   --   --   --  21.1  --  69.3 52.3 25.1  --     ------------------------------------------------------------------------------------------------------------------ No results for input(s): "CHOL", "HDL", "LDLCALC", "TRIG", "CHOLHDL", "LDLDIRECT" in the last 72 hours.  Lab Results  Component Value Date   HGBA1C 7.0 (H) 10/04/2021    No results for input(s): "TSH", "T4TOTAL", "T3FREE", "THYROIDAB" in the last 72 hours.  Invalid input(s): "FREET3"  ------------------------------------------------------------------------------------------------------------------ ID Labs Recent Labs  Lab 11/08/21 1048 11/09/21 0451 11/10/21 0421 11/11/21 0952 11/12/21 0251  WBC 7.7 6.9 6.6 5.6 6.1  PLT 286 255 261 275 299  CREATININE 1.17* 0.52 0.56 0.59 0.65   Cardiac Enzymes No results for input(s): "CKMB", "TROPONINI", "MYOGLOBIN" in the last 168 hours.  Invalid input(s): "CK"   Radiology Reports DG Foot Complete Right  Result Date: 11/10/2021 CLINICAL DATA:  Right foot and ankle pain.  No recent injury  EXAM: RIGHT FOOT COMPLETE - 3+ VIEW COMPARISON:  Right foot 10/21/2020 FINDINGS: Mild widening between the first and second metatarsals unchanged. 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 Mild degenerative change in the midfoot with spurring. Mild calcaneal spurring. Chronic periosteal thickening distal tibia and fibula due to chronic injury. IMPRESSION: No acute injury. Degenerative change in midfoot. 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 Electronically Signed   By: Franchot Gallo M.D.   On: 11/10/2021 12:13   DG Ankle Complete Right  Result Date: 11/10/2021 CLINICAL DATA:  Foot and ankle pain.  No recent injury EXAM: RIGHT ANKLE - COMPLETE 3+ VIEW COMPARISON:  Right ankle 09/09/2021 FINDINGS: Normal alignment no acute fracture Chronic periosteal thickening distal tibia and fibula due to chronic injury. This is unchanged. No joint effusion. IMPRESSION: Chronic ankle injury.  No acute fracture Electronically Signed   By: Franchot Gallo M.D.   On: 11/10/2021 12:11

## 2021-11-14 NOTE — TOC Progression Note (Signed)
Transition of Care [facility]) - Progression Note    Patient Details  Name: Teresa Murillo MRN: [id number] Date of Birth: 01-Apr-1960  Transition of [facility]) CM/SW [place], LCSW Phone Number: 11/14/2021, 10:43 AM  Clinical Narrative:     CSW reached out to [place] at group home to ascertain the decision of group home. CSW had to leave a message. Awaiting a call back.  TOC team will continue to assist with discharge planning needs.    Expected Discharge Plan: Group Home ([place] [facility]) Barriers to Discharge: Continued Medical Work up  Expected Discharge Plan and Services Expected Discharge Plan: Group Home ([place] [facility]) In-house Referral: Clinical Social Work     Living arrangements for the past 2 months: Group Home Expected Discharge Date: 11/12/21                                     Social Determinants of Health (SDOH) Interventions    Readmission Risk Interventions     No data to display

## 2021-11-14 NOTE — Progress Notes (Signed)
Mobility Specialist Progress Note:   11/14/21 0938  Mobility  Activity Ambulated independently in hallway  Level of Assistance Independent  Assistive Device None  Distance Ambulated (ft) 200 ft  Activity Response Tolerated well  $Mobility charge 1 Mobility   No complaints. Left in room with call bell in reach and all needs met.   [facility] Surveyor, mining Chat only

## 2021-11-15 DIAGNOSIS — I4892 Unspecified atrial flutter: Secondary | ICD-10-CM | POA: Diagnosis not present

## 2021-11-15 LAB — GLUCOSE, CAPILLARY
Glucose-Capillary: 196 mg/dL — ABNORMAL HIGH (ref 70–99)
Glucose-Capillary: 224 mg/dL — ABNORMAL HIGH (ref 70–99)
Glucose-Capillary: 227 mg/dL — ABNORMAL HIGH (ref 70–99)
Glucose-Capillary: 254 mg/dL — ABNORMAL HIGH (ref 70–99)

## 2021-11-15 MED ORDER — DILTIAZEM HCL 60 MG PO TABS
90.0000 mg | ORAL_TABLET | Freq: Once | ORAL | Status: AC
  Administered 2021-11-15: 90 mg via ORAL
  Filled 2021-11-15: qty 2

## 2021-11-15 MED ORDER — DILTIAZEM HCL ER COATED BEADS 240 MG PO CP24
240.0000 mg | ORAL_CAPSULE | Freq: Every day | ORAL | Status: DC
  Administered 2021-11-16: 240 mg via ORAL
  Filled 2021-11-15: qty 1

## 2021-11-15 NOTE — Progress Notes (Signed)
Patient's HR 118-120 at 1051.  Patient has been up ambulating frequently. Dr Candiss Norse notified that patient had just received AM medications.  He stated he would give an additional one time dose of Cardizem and increase her next doses of Cardizem.

## 2021-11-15 NOTE — TOC Progression Note (Signed)
Transition of Care [facility]) - Progression Note    Patient Details  Name: Sinahi Knights MRN: [id number] Date of Birth: 12/27/1960  Transition of Care [facility]) CM/SW [place], LCSW Phone Number: 11/15/2021, 10:22 AM  Clinical Narrative:     CSW spoke with [name] from group home and she informed CSW that a decision has not been made yet to accept pt back due to needing to follow up with Eddie Dibbles her case manager and that the medications have not been reconciled as for as she knows. She explained to CSW that goal would be to form a plan for pt's DC on Monday.  TOC team will continue to assist with discharge planning needs.   Expected Discharge Plan: Group Home ([place] [facility]) Barriers to Discharge: Continued Medical Work up  Expected Discharge Plan and Services Expected Discharge Plan: Group Home ([place] [facility]) In-house Referral: Clinical Social Work     Living arrangements for the past 2 months: Group Home Expected Discharge Date: 11/12/21                                     Social Determinants of Health (SDOH) Interventions    Readmission Risk Interventions     No data to display

## 2021-11-15 NOTE — Progress Notes (Signed)
PROGRESS NOTE                                                                                                                                                                                                             Patient Demographics:    Teresa Murillo, is a 61 y.o. female, DOB - Sep 27, 1960, [id number]  Outpatient Primary MD for the patient is Ileene Hutchinson, [place]    LOS - 6  Admit date - 11/08/2021    Chief Complaint  Patient presents with   Medical Clearance       Brief Narrative (HPI from H&P)  61 y.o. female with medical history significant of schizophrenia, COPD, hyperlipidemia, hypertension, diabetes, alcohol, hypothyroidism, anemia presenting with encephalopathy and hallucinations.  In the ER work-up consistent with acute encephalopathy, AKI along with A-fib RVR.   Subjective:   Patient in bed, appears comfortable, denies any headache, no fever, no chest pain or pressure, no shortness of breath , no abdominal pain. No new focal weakness.   Assessment  & Plan :   Patient is medically stable for discharge await appropriate bed.  Since the discharge summary like has changed her psychiatric medications further, they will need to be reconciled again prior to final discharge.      Newly diagnosed likely paroxysmal atrial flutter with RVR with [place] vas 2 score of 2 or higher.   Patient placed on IV Cardizem drip currently on oral Cardizem dose adjusted further on 11/10/2021 for better control, cardiology on board, continue Eliquis for now.  TSH stable, echo nonacute.   Medically stable will look for appropriate facility for discharge her previous facility has no bed for her.   Acute encephalopathy -  Patient presenting from facility after issues with acute cephalopathy and hallucinations.  She is have history of schizophrenia, EEG, CT head and MRI brain nonacute, CT abdomen pelvis nonacute, no headache or nuchal  rigidity, likely mildly dehydrated for which she is getting IV fluids, and by psychiatry this admission.  Per psych she is stable.  On following regimen.  - Continue reduced home dose of Clozaril 50 qAM and 75 mg qHS in light of suspected anticholinergic overload. - START Abilify 10 mg daily as adjunctive therapy to Clozaril - RESTART Haldol 10 mg, but TID PRN agitation or psychotic symptoms. Would like to assess patient's need for  dosages.  - Resume home Depakote 500 mg BID - Continue Ingrezza 40 mg for TD - Previously discontinued home Cogentin and Lybalvi in light of suspected anticholinergic overload.   AKI -  Elevated 1.17 baseline around 0.5.  Likely due to dehydration hydrate with IV fluids monitor bladder scans.  CT abdomen pelvis nonacute.   Schizophrenia  - per SNF was hallucinating prior to hospital transfer, have consulted psych to assist with schizophrenia medications and management.  COPD - Symbicort with formulary Dulera, continue as needed albuterol   Hypertension  - Hold home antihypertensives currently on diltiazem.   Glaucoma - Continue home eyedrops   Borderline elevation of ammonia.  Placed on lactulose.    Chronic right foot discomfort after an injury. Stable X-rays with chronic changes outpatient orthopedic follow-up.  Diabetes - SSI    Lab Results  Component Value Date   HGBA1C 7.0 (H) 10/04/2021   CBG (last 3)  Recent Labs    11/14/21 1210 11/14/21 1654 11/14/21 2122  GLUCAP 224* 210* 207*         Condition - Fair  Family Communication  : None present.  Code Status :  Full  Consults  :  Psych, Cards  PUD Prophylaxis : PPI   Procedures  :     TTE - 1. A small pericardial effusion is present, circumferential with no evidence of mitral valve respirophasic variation of inflow Doppler. Patient did not complete exam: cannot see IVC. In views obtained, study not suggestive of cardiac tamponade.  2. Left ventricular ejection fraction, by  estimation, is 60 to 65%. The left ventricle has normal function. Left ventricular endocardial border not optimally defined to evaluate regional wall motion. There is mild concentric left ventricular hypertrophy. Left ventricular diastolic parameters are consistent with Grade I diastolic dysfunction (impaired relaxation).  3. Right ventricular systolic function is normal. The right ventricular size is normal.  4. Left atrial size was mildly dilated.  5. The mitral valve is degenerative. Mild mitral valve regurgitation. Moderate mitral annular calcification.  6. The aortic valve is tricuspid. There is mild calcification of the aortic valve. Aortic valve regurgitation is not visualized. Aortic valve sclerosis is present, with no evidence of aortic valve stenosis. Comparison(s): Limited study- pericardial effusion has decreased in size.  EEG - Non Acute  CT head and MRI brain.  Both nonacute.  CT scan abdomen pelvis.  Nonacute.      Disposition Plan  :    Status is: Observation  DVT Prophylaxis  :     apixaban (ELIQUIS) tablet 5 mg   Lab Results  Component Value Date   PLT 299 11/12/2021    Diet :  Diet Order             Diet - low sodium heart healthy           Diet heart healthy/carb modified Room service appropriate? Yes; Fluid consistency: Thin  Diet effective now                    Inpatient Medications  Scheduled Meds:  apixaban  5 mg Oral BID   ARIPiprazole  10 mg Oral Daily   cloZAPine  50 mg Oral Daily   cloZAPine  75 mg Oral QHS   diltiazem  180 mg Oral Daily   divalproex  500 mg Oral BID   insulin aspart  0-9 Units Subcutaneous TID WC   lactulose  10 g Oral Daily   latanoprost  1 drop Both  Eyes QHS   mometasone-formoterol  2 puff Inhalation BID   sodium chloride flush  3 mL Intravenous Q12H   valbenazine  40 mg Oral Daily   Continuous Infusions:    PRN Meds:.acetaminophen **OR** acetaminophen, albuterol, diltiazem, guaiFENesin-dextromethorphan,  haloperidol **FOLLOWED BY** [DISCONTINUED] haloperidol **FOLLOWED BY** [DISCONTINUED] haloperidol, hydrALAZINE, polyethylene glycol  Antibiotics  :    Anti-infectives (From admission, onward)    None        Time Spent in minutes  30   Lala Lund M.D on 11/15/2021 at 8:14 AM  To page go to [email or web address]   [facility] -  Office  [phone]  See all Orders from today for further details    Objective:   Vitals:   11/14/21 0911 11/14/21 1657 11/14/21 2104 11/15/21 0528  BP:  126/78 138/78 106/73  Pulse:  88 98 77  Resp:  16 16 18   Temp:  97.9 F (36.6 C) (!) 97.5 F (36.4 C) 97.8 F (36.6 C)  TempSrc:  Oral Axillary Oral  SpO2: 97% 100% 99%     Wt Readings from Last 3 Encounters:  08/22/21 61.2 kg  11/01/20 68 kg  06/02/20 69.4 kg     Intake/Output Summary (Last 24 hours) at 11/15/2021 0814 Last data filed at 11/14/2021 2104 Gross per 24 hour  Intake 480 ml  Output --  Net 480 ml     Physical Exam  Awake Alert, No new F.N deficits, Normal affect [place].AT,PERRAL Supple Neck, No JVD,   Symmetrical Chest wall movement, Good air movement bilaterally, CTAB RRR,No Gallops, Rubs or new Murmurs,  +ve B.Sounds, Abd Soft, No tenderness,   No Cyanosis, Clubbing or edema      Data Review:    CBC Recent Labs  Lab 11/08/21 1048 11/08/21 1858 11/09/21 0451 11/10/21 0421 11/11/21 0952 11/12/21 0251  WBC 7.7  --  6.9 6.6 5.6 6.1  HGB 12.3 12.2 11.2* 10.5* 11.2* 11.5*  HCT 38.2 36.0 34.0* 32.0* 35.0* 35.1*  PLT 286  --  255 261 275 299  MCV 80.8  --  80.0 79.4* 80.8 80.5  MCH 26.0  --  26.4 26.1 25.9* 26.4  MCHC 32.2  --  32.9 32.8 32.0 32.8  RDW 16.7*  --  16.8* 16.6* 16.9* 16.9*  LYMPHSABS 1.2  --   --  2.6 1.6 2.3  MONOABS 0.7  --   --  0.6 0.6 0.6  EOSABS 0.1  --   --  0.2 0.1 0.1  BASOSABS 0.0  --   --  0.1 0.0 0.1    Electrolytes Recent Labs  Lab 11/08/21 1048 11/08/21 1718 11/08/21 1730 11/08/21 1858 11/08/21 2118  11/09/21 0451 11/10/21 0421 11/11/21 0952 11/12/21 0251 11/13/21 0302  NA 138  --   --  135  --  133* 135 134* 135  --   K 4.1  --   --  3.9  --  4.0 4.3 4.5 4.1  --   CL 104  --   --   --   --  100 102 99 101  --   CO2 23  --   --   --   --  24 25 27 27   --   GLUCOSE 129*  --   --   --   --  110* 136* 152* 114*  --   BUN 16  --   --   --   --  7 6 9 15   --   CREATININE 1.17*  --   --   --   --  0.52 0.56 0.59 0.65  --   CALCIUM 9.3  --   --   --   --  8.5* 8.8* 9.0 8.9  --   AST 20  --   --   --   --  15 17 16 15   --   ALT 15  --   --   --   --  12 12 12 14   --   ALKPHOS 65  --   --   --   --  52 53 51 52  --   BILITOT 0.5  --   --   --   --  0.7 0.5 0.5 0.5  --   ALBUMIN 3.7  --   --   --   --  3.0* 3.2* 3.1* 3.1*  --   MG  --   --  1.7  --   --   --  1.4* 1.4* 1.6* 1.7  TSH  --   --  1.293  --   --   --   --   --   --   --   AMMONIA  --  49*  --   --   --   --  28  --   --   --   BNP  --   --   --   --  21.1  --  69.3 52.3 25.1  --     ------------------------------------------------------------------------------------------------------------------ No results for input(s): "CHOL", "HDL", "LDLCALC", "TRIG", "CHOLHDL", "LDLDIRECT" in the last 72 hours.  Lab Results  Component Value Date   HGBA1C 7.0 (H) 10/04/2021    No results for input(s): "TSH", "T4TOTAL", "T3FREE", "THYROIDAB" in the last 72 hours.  Invalid input(s): "FREET3"  ------------------------------------------------------------------------------------------------------------------ ID Labs Recent Labs  Lab 11/08/21 1048 11/09/21 0451 11/10/21 0421 11/11/21 0952 11/12/21 0251  WBC 7.7 6.9 6.6 5.6 6.1  PLT 286 255 261 275 299  CREATININE 1.17* 0.52 0.56 0.59 0.65   Cardiac Enzymes No results for input(s): "CKMB", "TROPONINI", "MYOGLOBIN" in the last 168 hours.  Invalid input(s): "CK"   Radiology Reports No results found.

## 2021-11-16 ENCOUNTER — Other Ambulatory Visit (HOSPITAL_COMMUNITY): Payer: Self-pay

## 2021-11-16 DIAGNOSIS — I4892 Unspecified atrial flutter: Secondary | ICD-10-CM | POA: Diagnosis not present

## 2021-11-16 LAB — GLUCOSE, CAPILLARY
Glucose-Capillary: 187 mg/dL — ABNORMAL HIGH (ref 70–99)
Glucose-Capillary: 235 mg/dL — ABNORMAL HIGH (ref 70–99)

## 2021-11-16 LAB — COMPREHENSIVE METABOLIC PANEL
ALT: 13 U/L (ref 0–44)
AST: 19 U/L (ref 15–41)
Albumin: 3.2 g/dL — ABNORMAL LOW (ref 3.5–5.0)
Alkaline Phosphatase: 50 U/L (ref 38–126)
Anion gap: 7 (ref 5–15)
BUN: 9 mg/dL (ref 6–20)
CO2: 27 mmol/L (ref 22–32)
Calcium: 9.2 mg/dL (ref 8.9–10.3)
Chloride: 104 mmol/L (ref 98–111)
Creatinine, Ser: 0.51 mg/dL (ref 0.44–1.00)
GFR, Estimated: >60 mL/min (ref >60)
Glucose, Bld: 130 mg/dL — ABNORMAL HIGH (ref 70–99)
Potassium: 4.3 mmol/L (ref 3.5–5.1)
Sodium: 138 mmol/L (ref 135–145)
Total Bilirubin: 0.3 mg/dL (ref 0.3–1.2)
Total Protein: 6.5 g/dL (ref 6.5–8.1)

## 2021-11-16 LAB — CBC
HCT: 32.9 % — ABNORMAL LOW (ref 36.0–46.0)
Hemoglobin: 10.7 g/dL — ABNORMAL LOW (ref 12.0–15.0)
MCH: 26.4 pg (ref 26.0–34.0)
MCHC: 32.5 g/dL (ref 30.0–36.0)
MCV: 81 fL (ref 80.0–100.0)
Platelets: 341 10*3/uL (ref 150–400)
RBC: 4.06 MIL/uL (ref 3.87–5.11)
RDW: 17.6 % — ABNORMAL HIGH (ref 11.5–15.5)
WBC: 5.1 10*3/uL (ref 4.0–10.5)
nRBC: 0 % (ref 0.0–0.2)

## 2021-11-16 LAB — MAGNESIUM: Magnesium: 1.7 mg/dL (ref 1.7–2.4)

## 2021-11-16 MED ORDER — DILTIAZEM HCL ER COATED BEADS 240 MG PO CP24: 240.0000 mg | ORAL_CAPSULE | Freq: Every day | ORAL | Status: DC

## 2021-11-16 MED ORDER — ARIPIPRAZOLE 10 MG PO TABS: 10.0000 mg | ORAL_TABLET | Freq: Every day | ORAL | Status: DC

## 2021-11-16 MED ORDER — CLOZAPINE 25 MG PO TABS: 75.0000 mg | ORAL_TABLET | Freq: Every day | ORAL | Status: DC

## 2021-11-16 MED ORDER — INSULIN PEN NEEDLE 32G X 4 MM MISC
0 refills | Status: AC
  Filled 2021-11-16: qty 100, 25d supply, fill #0

## 2021-11-16 MED ORDER — INGREZZA 40 MG PO CAPS
40.0000 mg | ORAL_CAPSULE | Freq: Every morning | ORAL | 0 refills | Status: AC
  Filled 2021-11-16: qty 30, 30d supply, fill #0

## 2021-11-16 MED ORDER — CLOZAPINE 50 MG PO TABS
50.0000 mg | ORAL_TABLET | Freq: Every day | ORAL | 0 refills | Status: DC
  Filled 2021-11-16: qty 30, 30d supply, fill #0

## 2021-11-16 MED ORDER — GLUCOSE BLOOD VI STRP
ORAL_STRIP | 0 refills | Status: AC
  Filled 2021-11-16: qty 50, 30d supply, fill #0

## 2021-11-16 MED ORDER — HALOPERIDOL 10 MG PO TABS: 10.0000 mg | ORAL_TABLET | Freq: Three times a day (TID) | ORAL | Status: AC | PRN

## 2021-11-16 MED ORDER — CLOZAPINE 50 MG PO TABS: 50.0000 mg | ORAL_TABLET | Freq: Every day | ORAL | Status: DC

## 2021-11-16 MED ORDER — INSULIN ASPART 100 UNIT/ML FLEXPEN
PEN_INJECTOR | SUBCUTANEOUS | 0 refills | Status: AC
  Filled 2021-11-16: qty 9, 30d supply, fill #0

## 2021-11-16 MED ORDER — APIXABAN 5 MG PO TABS
5.0000 mg | ORAL_TABLET | Freq: Two times a day (BID) | ORAL | 0 refills | Status: AC
  Filled 2021-11-16: qty 60, 30d supply, fill #0

## 2021-11-16 MED ORDER — INSULIN GLARGINE-YFGN 100 UNIT/ML ~~LOC~~ SOLN
12.0000 [IU] | Freq: Every day | SUBCUTANEOUS | Status: DC
  Administered 2021-11-16: 12 [IU] via SUBCUTANEOUS
  Filled 2021-11-16: qty 0.12

## 2021-11-16 MED ORDER — CLOZAPINE 25 MG PO TABS
75.0000 mg | ORAL_TABLET | Freq: Every day | ORAL | 0 refills | Status: DC
  Filled 2021-11-16: qty 90, 30d supply, fill #0

## 2021-11-16 MED ORDER — ARIPIPRAZOLE 10 MG PO TABS
10.0000 mg | ORAL_TABLET | Freq: Every day | ORAL | 0 refills | Status: AC
  Filled 2021-11-16: qty 30, 30d supply, fill #0

## 2021-11-16 MED ORDER — INSULIN GLARGINE 100 UNIT/ML SOLOSTAR PEN
12.0000 [IU] | PEN_INJECTOR | Freq: Every day | SUBCUTANEOUS | 0 refills | Status: AC
  Filled 2021-11-16: qty 3, 25d supply, fill #0

## 2021-11-16 MED ORDER — INSULIN ASPART 100 UNIT/ML FLEXPEN: PEN_INJECTOR | SUBCUTANEOUS | 0 refills | Status: DC

## 2021-11-16 MED ORDER — ACCU-CHEK SOFTCLIX LANCETS MISC
0 refills | Status: AC
  Filled 2021-11-16: qty 100, 30d supply, fill #0

## 2021-11-16 MED ORDER — INSULIN GLARGINE-YFGN 100 UNIT/ML ~~LOC~~ SOLN: 12.0000 [IU] | Freq: Every day | SUBCUTANEOUS | 0 refills | Status: DC

## 2021-11-16 MED ORDER — ACCU-CHEK GUIDE W/DEVICE KIT
PACK | 0 refills | Status: AC
  Filled 2021-11-16: qty 1, 30d supply, fill #0

## 2021-11-16 MED ORDER — DIVALPROEX SODIUM ER 500 MG PO TB24
500.0000 mg | ORAL_TABLET | Freq: Two times a day (BID) | ORAL | 0 refills | Status: AC
  Filled 2021-11-16: qty 60, 30d supply, fill #0

## 2021-11-16 MED ORDER — DIVALPROEX SODIUM ER 500 MG PO TB24: 500.0000 mg | ORAL_TABLET | Freq: Two times a day (BID) | ORAL | Status: DC

## 2021-11-16 MED ORDER — DILTIAZEM HCL ER COATED BEADS 240 MG PO CP24
240.0000 mg | ORAL_CAPSULE | Freq: Every day | ORAL | 0 refills | Status: AC
  Filled 2021-11-16: qty 30, 30d supply, fill #0

## 2021-11-16 NOTE — Consult Note (Signed)
Brief Psychiatry Consult Note   Date of service: November 16, 2021 Patient Name: Teresa Murillo DOB: 1960/05/04 MRN: [id number] Reason for consult: "AMS, Hallucinations" Requesting Provider: Thurnell Lose, MD  Joud Pettinato is a 61 y.o. female admitted medically for 11/08/2021  9:56 AM for a-fib with RVR. She is now medically cleared for discharge awaiting group home placement. She carries the psychiatric diagnoses of schizophrenia and has a past medical history of COPD, hyperlipidemia, hypertension, diabetes, alcohol, hypothyroidism, anemia.  Interim documentation by primary team and nursing staff has been reviewed. At this time, patient is psychiatrically cleared for discharge and there is no evidence of acute psychiatric disturbance requiring ongoing psychiatric consultation. Please see last consult note for full assessment. Final medication recommendations as seen below.  Called [facility], Attempted to speak to Dr. Franchot Mimes, [phone] but she is not in office today.  Spoke to [employer]: advised her of patient's medications, on which she is to be discharged including Clozaril 50 mg every morning and 75 mg nightly, Abilify 10 mg as adjunctive therapy, Depakote ER 500 mg twice daily, Haldol 10 mg 3 times daily as needed, and Ingrezza 40 mg daily.  I informed her that the Cogentin and labile the were discontinued due to increased anticholinergic activity, which were likely contributory to her confusion, drooling, and maladaptive behaviors.  [name] informed that she would discuss patient's medications with her case manager, and inform LCSW Ebony Hail if Courtany is able to return to the group home temporarily or if she needed to be provided shelter resources.   Assessment and recommendations: It is possible that symptoms and behaviors were partially related to anticholinergic overload.  Thus, Cogentin and Lybalvi were discontinued. She does have some residual  hallucinations/psychosis; a recent review suggests abilify + clozapine as combo most likely to work. As no information yet available about patient's Abilify response and if this were the case with monotherapy vs adjunctive therapy, will continue Abilify at this time.   Medications: - Continue reduced home dose of Clozaril 50 qAM and 75 mg qHS in light of suspected anticholinergic overload. - Continue Abilify 10 mg daily as adjunctive therapy to Clozaril - Continue Haldol 10 mg TID PRN agitation or psychotic symptoms - Continue home Depakote 500 mg BID - Continue Ingrezza 40 mg for TD - Previously discontinued home Cogentin and Lybalvi in light of suspected anticholinergic overload.  Most recent QTc 432 on 11/08/21.  At this time, psychiatry team will sign off.  Signed: Rosezetta Schlatter, MD Psychiatry Resident, PGY-2 [place] 11/16/2021, 10:00 AM

## 2021-11-16 NOTE — Progress Notes (Signed)
Mobility Specialist - Progress Note   11/16/21 1516  Mobility  Activity Ambulated independently in hallway  Level of Assistance Independent  Assistive Device None  Distance Ambulated (ft) 200 ft  Activity Response Tolerated well  $Mobility charge 1 Mobility   Pt received at room door. No complaints throughout. Pt returned to room with all needs met.  Larey Seat

## 2021-11-16 NOTE — NC FL2 (Signed)
[place] MEDICAID FL2 LEVEL OF CARE SCREENING TOOL     IDENTIFICATION  Patient Name: Teresa Murillo Birthdate: [birth year] Sex: female Admission Date (Current Location): 11/08/2021  [facility] [place] Number:  Herbalist and Address:  [facility], [place] [address] [place], [place] [address]      Provider Number: [id number]  Attending Physician Name and Address:  Thurnell Lose, MD  Relative Name and Phone Number:       Current Level of Care: Hospital Recommended Level of Care: Other (Comment) (Group Home) Prior Approval Number:    Date Approved/Denied:   PASRR Number:    Discharge Plan: Other (Comment) (Group Home)    Current Diagnoses: Patient Active Problem List   Diagnosis Date Noted   Hypothyroidism 11/08/2021   Atrial flutter with rapid ventricular response ([place]) 11/08/2021   Acute encephalopathy 11/08/2021   AKI (acute kidney injury) ([place]) 11/08/2021   Aggressive behavior 10/21/2020   COPD (chronic obstructive pulmonary disease) ([place]) 06/02/2020   Broken ankle 06/02/2020   Glaucoma 06/06/2018   Microcytic anemia 06/05/2018   Schizophrenia ([place]) 04/24/2015   Dyslipidemia 04/24/2015   Essential hypertension 12/15/2014   Type 2 diabetes mellitus without complications ([place]) 70/62/3762    Orientation RESPIRATION BLADDER Height & Weight     Self, Time, Situation, Place  Normal Continent Weight:   Height:     BEHAVIORAL SYMPTOMS/MOOD NEUROLOGICAL BOWEL NUTRITION STATUS      Continent Diet: Heart Healthy Low Carb  AMBULATORY STATUS COMMUNICATION OF NEEDS Skin   Independent Verbally Other (Comment) (WDL)                       Personal Care Assistance Level of Assistance  Bathing, Feeding, Dressing Bathing Assistance: Limited assistance Feeding assistance: Independent (can feed self) Dressing Assistance: Limited assistance     Functional Limitations Info  Sight, Hearing, Speech Sight Info: Adequate Hearing  Info: Adequate Speech Info: Adequate    SPECIAL CARE FACTORS FREQUENCY                       Contractures Contractures Info: Not present    Additional Factors Info  Code Status, Allergies, Psychotropic, Insulin Sliding Scale Code Status Info: FULL Allergies Info: Penicillins Psychotropic Info: ARIPiprazole (ABILIFY) tablet 10 mg daily,cloZAPine (CLOZARIL) tablet 50 mg daily,cloZAPine (CLOZARIL) tablet 75 mg daily at bedtime,divalproex (DEPAKOTE ER) 24 hr tablet 500 mg 2 times daily Insulin Sliding Scale Info: insulin aspart (novoLOG) injection 0-9 Units 3 times daily with meals,insulin glargine-yfgn (SEMGLEE) injection 12 Units daily       TAKE these medications     apixaban 5 MG Tabs tablet Commonly known as: ELIQUIS Take 1 tablet (5 mg total) by mouth 2 (two) times daily.    ARIPiprazole 10 MG tablet Commonly known as: ABILIFY Take 1 tablet (10 mg total) by mouth daily. Start taking on: November 17, 2021    blood glucose meter kit and supplies Kit DM testing supplies/kit ( glucometer, lancets, strips)  for QAC - HS accuchecks. Can give 1 month testing supplies for QA CHS Accu-Cheks of any brand.    cloZAPine 25 MG tablet Commonly known as: CLOZARIL Take 3 tablets (75 mg total) by mouth at bedtime. What changed: You were already taking a medication with the same name, and this prescription was added. Make sure you understand how and when to take each.    clozapine 50 MG tablet Commonly known as: CLOZARIL Take  1 tablet (50 mg total) by mouth daily. Start taking on: November 17, 2021 What changed: when to take this    diltiazem 240 MG 24 hr capsule Commonly known as: CARDIZEM CD Take 1 capsule (240 mg total) by mouth daily. Start taking on: November 17, 2021    divalproex 500 MG 24 hr tablet Commonly known as: DEPAKOTE ER Take 1 tablet (500 mg total) by mouth 2 (two) times daily. What changed: when to take this    haloperidol 10 MG tablet Commonly known  as: HALDOL Take 1 tablet (10 mg total) by mouth 3 (three) times daily as needed for agitation (and psychotic symptoms).    Ingrezza 40 MG capsule Generic drug: valbenazine Take 1 capsule (40 mg total) by mouth in the morning.    insulin aspart 100 UNIT/ML FlexPen Commonly known as: NOVOLOG Before each meal 3 times a day, 140-199 - 2 units, 200-250 - 4 units, 251-299 - 6 units,  300-349 - 8 units,  350 or above 10 units. Insulin PEN if approved, provide syringes and needles if needed.Please switch to any approved short acting Insulin if needed.    insulin glargine-yfgn 100 UNIT/ML injection Commonly known as: SEMGLEE Inject 0.12 mLs (12 Units total) into the skin daily. Start taking on: November 17, 2021    Insulin Syringe-Needle U-100 25G X 1" 1 ML Misc For 4 times a day insulin SQ, 1 month supply. Diagnosis E11.65    latanoprost 0.005 % ophthalmic solution Commonly known as: XALATAN Place 1 drop into both eyes at bedtime.    metFORMIN 500 MG tablet Commonly known as: GLUCOPHAGE Take 500 mg by mouth 2 (two) times daily with a meal.    nicotine 7 mg/24hr patch Commonly known as: NICODERM CQ - dosed in mg/24 hr Place 1 patch (7 mg total) onto the skin daily.    ProAir HFA 108 (90 Base) MCG/ACT inhaler Generic drug: albuterol Inhale 2 puffs into the lungs every 6 (six) hours as needed for wheezing or shortness of breath.    Symbicort 160-4.5 MCG/ACT inhaler Generic drug: budesonide-formoterol Inhale 2 puffs into the lungs in the morning and at bedtime.    Vitamin D3 1.25 MG (50000 UT) Caps Take 50,000 Units by mouth every Thursday.   Relevant Imaging Results:  Relevant Lab Results:   Additional Information [phone]  Milas Gain, LCSWA

## 2021-11-16 NOTE — Progress Notes (Signed)
PROGRESS NOTE                                                                                                                                                                                                             Patient Demographics:    Teresa Murillo, is a 61 y.o. female, DOB - May 06, 1960, [id number]  Outpatient Primary MD for the patient is Ileene Hutchinson, [place]    LOS - 7  Admit date - 11/08/2021    Chief Complaint  Patient presents with   Medical Clearance       Brief Narrative (HPI from H&P)  61 y.o. female with medical history significant of schizophrenia, COPD, hyperlipidemia, hypertension, diabetes, alcohol, hypothyroidism, anemia presenting with encephalopathy and hallucinations.  In the ER work-up consistent with acute encephalopathy, AKI along with A-fib RVR.   Subjective:   Patient in bed, appears comfortable, denies any headache, no fever, no chest pain or pressure, no shortness of breath , no abdominal pain. No new focal weakness.    Assessment  & Plan :   Patient is medically stable for discharge await appropriate bed discharge.  Med rec needs to be done prior to discharge again.   Newly diagnosed likely paroxysmal atrial flutter with RVR with [place] vas 2 score of 2 or higher.   Patient placed on IV Cardizem drip currently on oral Cardizem dose adjusted further on 11/15/2021 for better control, cardiology on board, continue Eliquis for now.  TSH stable, echo nonacute.   Medically stable will look for appropriate facility for discharge her previous facility has no bed for her.   Acute encephalopathy -  Patient presenting from facility after issues with acute cephalopathy and hallucinations.  She is have history of schizophrenia, EEG, CT head and MRI brain nonacute, CT abdomen pelvis nonacute, no headache or nuchal rigidity, likely mildly dehydrated for which she is getting IV fluids, and by psychiatry  this admission.  Per psych she is stable.  On following regimen.  - Continue reduced home dose of Clozaril 50 qAM and 75 mg qHS in light of suspected anticholinergic overload. - START Abilify 10 mg daily as adjunctive therapy to Clozaril - RESTART Haldol 10 mg, but TID PRN agitation or psychotic symptoms. Would like to assess patient's need for dosages.  - Resume home Depakote 500 mg BID - Continue Ingrezza 40  mg for TD - Previously discontinued home Cogentin and Lybalvi in light of suspected anticholinergic overload.   AKI -  Elevated 1.17 baseline around 0.5.  Likely due to dehydration hydrate with IV fluids monitor bladder scans.  CT abdomen pelvis nonacute.   Schizophrenia  - per SNF was hallucinating prior to hospital transfer, have consulted psych to assist with schizophrenia medications and management.  COPD - Symbicort with formulary Dulera, continue as needed albuterol   Hypertension  - Hold home antihypertensives currently on diltiazem.   Glaucoma - Continue home eyedrops   Borderline elevation of ammonia.  Placed on lactulose.    Chronic right foot discomfort after an injury. Stable X-rays with chronic changes outpatient orthopedic follow-up.  Diabetes - SSI    Lab Results  Component Value Date   HGBA1C 7.0 (H) 10/04/2021   CBG (last 3)  Recent Labs    11/15/21 1651 11/15/21 2100 11/16/21 0819  GLUCAP 227* 254* 187*         Condition - Fair  Family Communication  : None present.  Code Status :  Full  Consults  :  Psych, Cards  PUD Prophylaxis : PPI   Procedures  :     TTE - 1. A small pericardial effusion is present, circumferential with no evidence of mitral valve respirophasic variation of inflow Doppler. Patient did not complete exam: cannot see IVC. In views obtained, study not suggestive of cardiac tamponade.  2. Left ventricular ejection fraction, by estimation, is 60 to 65%. The left ventricle has normal function. Left ventricular endocardial  border not optimally defined to evaluate regional wall motion. There is mild concentric left ventricular hypertrophy. Left ventricular diastolic parameters are consistent with Grade I diastolic dysfunction (impaired relaxation).  3. Right ventricular systolic function is normal. The right ventricular size is normal.  4. Left atrial size was mildly dilated.  5. The mitral valve is degenerative. Mild mitral valve regurgitation. Moderate mitral annular calcification.  6. The aortic valve is tricuspid. There is mild calcification of the aortic valve. Aortic valve regurgitation is not visualized. Aortic valve sclerosis is present, with no evidence of aortic valve stenosis. Comparison(s): Limited study- pericardial effusion has decreased in size.  EEG - Non Acute  CT head and MRI brain.  Both nonacute.  CT scan abdomen pelvis.  Nonacute.      Disposition Plan  :    Status is: Observation  DVT Prophylaxis  :     apixaban (ELIQUIS) tablet 5 mg   Lab Results  Component Value Date   PLT 341 11/16/2021    Diet :  Diet Order             Diet - low sodium heart healthy           Diet heart healthy/carb modified Room service appropriate? Yes; Fluid consistency: Thin  Diet effective now                    Inpatient Medications  Scheduled Meds:  apixaban  5 mg Oral BID   ARIPiprazole  10 mg Oral Daily   cloZAPine  50 mg Oral Daily   cloZAPine  75 mg Oral QHS   diltiazem  240 mg Oral Daily   divalproex  500 mg Oral BID   insulin aspart  0-9 Units Subcutaneous TID WC   insulin glargine-yfgn  12 Units Subcutaneous Daily   lactulose  10 g Oral Daily   latanoprost  1 drop Both Eyes QHS  mometasone-formoterol  2 puff Inhalation BID   sodium chloride flush  3 mL Intravenous Q12H   valbenazine  40 mg Oral Daily   Continuous Infusions:    PRN Meds:.acetaminophen **OR** acetaminophen, albuterol, diltiazem, guaiFENesin-dextromethorphan, haloperidol **FOLLOWED BY** [DISCONTINUED]  haloperidol **FOLLOWED BY** [DISCONTINUED] haloperidol, hydrALAZINE, polyethylene glycol  Antibiotics  :    Anti-infectives (From admission, onward)    None        Time Spent in minutes  30   Lala Lund M.D on 11/16/2021 at 9:59 AM  To page go to [email or web address]   [facility] -  Office  [phone]  See all Orders from today for further details    Objective:   Vitals:   11/15/21 2104 11/16/21 0438 11/16/21 0727 11/16/21 0813  BP: (!) 151/78 123/81  129/63  Pulse: 92 75    Resp:      Temp: 98 F (36.7 C) 97.8 F (36.6 C)    TempSrc: Oral Oral    SpO2: 97% 96% 97%     Wt Readings from Last 3 Encounters:  08/22/21 61.2 kg  11/01/20 68 kg  06/02/20 69.4 kg    No intake or output data in the 24 hours ending 11/16/21 0959    Physical Exam  Awake Alert, No new F.N deficits, Normal affect [place].AT,PERRAL Supple Neck, No JVD,   Symmetrical Chest wall movement, Good air movement bilaterally, CTAB RRR,No Gallops, Rubs or new Murmurs,  +ve B.Sounds, Abd Soft, No tenderness,   No Cyanosis, Clubbing or edema      Data Review:    CBC Recent Labs  Lab 11/10/21 0421 11/11/21 0952 11/12/21 0251 11/16/21 0553  WBC 6.6 5.6 6.1 5.1  HGB 10.5* 11.2* 11.5* 10.7*  HCT 32.0* 35.0* 35.1* 32.9*  PLT 261 275 299 341  MCV 79.4* 80.8 80.5 81.0  MCH 26.1 25.9* 26.4 26.4  MCHC 32.8 32.0 32.8 32.5  RDW 16.6* 16.9* 16.9* 17.6*  LYMPHSABS 2.6 1.6 2.3  --   MONOABS 0.6 0.6 0.6  --   EOSABS 0.2 0.1 0.1  --   BASOSABS 0.1 0.0 0.1  --     Electrolytes Recent Labs  Lab 11/10/21 0421 11/11/21 0952 11/12/21 0251 11/13/21 0302 11/16/21 0553  NA 135 134* 135  --  138  K 4.3 4.5 4.1  --  4.3  CL 102 99 101  --  104  CO2 25 27 27   --  27  GLUCOSE 136* 152* 114*  --  130*  BUN 6 9 15   --  9  CREATININE 0.56 0.59 0.65  --  0.51  CALCIUM 8.8* 9.0 8.9  --  9.2  AST 17 16 15   --  19  ALT 12 12 14   --  13  ALKPHOS 53 51 52  --  50  BILITOT 0.5 0.5 0.5  --  0.3   ALBUMIN 3.2* 3.1* 3.1*  --  3.2*  MG 1.4* 1.4* 1.6* 1.7 1.7  AMMONIA 28  --   --   --   --   BNP 69.3 52.3 25.1  --   --     ------------------------------------------------------------------------------------------------------------------ No results for input(s): "CHOL", "HDL", "LDLCALC", "TRIG", "CHOLHDL", "LDLDIRECT" in the last 72 hours.  Lab Results  Component Value Date   HGBA1C 7.0 (H) 10/04/2021    No results for input(s): "TSH", "T4TOTAL", "T3FREE", "THYROIDAB" in the last 72 hours.  Invalid input(s): "FREET3"  ------------------------------------------------------------------------------------------------------------------ ID Labs Recent Labs  Lab 11/10/21 0421 11/11/21 [id number] 11/12/21 0251 11/16/21 [id number]  WBC 6.6 5.6 6.1 5.1  PLT 261 275 299 341  CREATININE 0.56 0.59 0.65 0.51   Cardiac Enzymes No results for input(s): "CKMB", "TROPONINI", "MYOGLOBIN" in the last 168 hours.  Invalid input(s): "CK"   Radiology Reports No results found.

## 2021-11-16 NOTE — Plan of Care (Signed)

## 2021-11-16 NOTE — TOC Progression Note (Addendum)
Transition of Care [facility]) - Progression Note    Patient Details  Name: Teresa Murillo MRN: [id number] Date of Birth: 1960/06/14  Transition of Care [facility]) CM/SW [place], [place] Phone Number: 11/16/2021, 9:55 AM  Clinical Narrative:     Update- CSW spoke with [place] who confirmed patient can dc back over to group home later this evening. [name] confirmed Group home will pick up patient at dc. Group Home confirmed will call CSW back shortly to confirm pick up time. [name] with Group Home requested FL2 and DC summary. CSW informed MD. CSW will continue to follow.  Update 12:20pm- CSW spoke with [name] at Group home who confirmed she should be able to take patient back today. She is wanting to speak with Dr. Earley Favor again to confirm what medications she will be discharging on. She also is awaiting to hear back from [place] with [facility]. CSW informed Dr. Earley Favor. Dr. Earley Favor informed CSW she is going to give [name] a call. CSW will continue to follow.   CSW reached out to [place] at group home regarding the decision of group home. CSW had to leave a message. Awaiting a call back  Expected Discharge Plan: [place] ([facility]) Barriers to Discharge: Continued Medical Work up  Expected Discharge Plan and Services Expected Discharge Plan: Group Home ([facility]) In-house Referral: Clinical Social Work     Living arrangements for the past 2 months: Group Home Expected Discharge Date: 11/12/21                                     Social Determinants of Health (SDOH) Interventions    Readmission Risk Interventions     No data to display

## 2021-11-16 NOTE — TOC Transition Note (Signed)
Transition of Care [facility]) - CM/SW Discharge Note   Patient Details  Name: Teresa Murillo MRN: [id number] Date of Birth: 09/19/1960  Transition of Care [facility]) CM/SW Contact:  Milas Gain, [place] Phone Number: 11/16/2021, 4:29 PM   Clinical Narrative:     Patient will DC to: [facility]   Anticipated DC date: 11/16/2021  Family notified: [name]  Transport by: Group Home   ?  Per MD patient ready for DC to [place] . RN, patient, patient's family, and facility notified of DC. Discharge Summary and FL2 sent to facility. RN given number for report tele#[phone].Group Home to provide transportation for patient. CSW signing off.   Final next level of care: Group Home Barriers to Discharge: No Barriers Identified   Patient Goals and CMS Choice Patient states their goals for this hospitalization and ongoing recovery are:: to go to group home CMS Medicare.gov Compare Post Acute Care list provided to:: Patient Choice offered to / list presented to : Patient  Discharge Placement              Patient chooses bed at:  ([place]) Patient to be transferred to facility by: [place] Name of family member notified: [name] Patient and family notified of of transfer: 11/16/21  Discharge Plan and Services In-house Referral: Clinical Social Work                                   Social Determinants of Health ([place]) Interventions     Readmission Risk Interventions     No data to display

## 2021-11-17 ENCOUNTER — Other Ambulatory Visit (HOSPITAL_COMMUNITY): Payer: Self-pay

## 2021-11-17 NOTE — Care Management (Addendum)
1116 11-17-21 Case Manager received notification from the owner of the [facility] that the patient needed some of her medications sent to [facility] in [place] [facility] [place]. Initial Rx's were sent to [facility] and TOC could not fill the Psych meds on yesterday. Medications to be sent to [facility] today- MD to re-write Rx for metformin and send to pharmacy. Insulins Lantus and Novolog have been discharged. Per Owner, patient will visit her PCP on tomorrow to follow up on her diabetes medications. No further needs identified from Case Manager at this time.   1127 11-17-21 MD not willing to dc the insulins- patient will need to follow up with PCP on 11-18-21 regarding insulins. Group Home owner Odette Horns is aware that the MD is not discharging the medications and this is the regimen that he feels appropriate for the patient. No further needs at this time.

## 2021-11-17 NOTE — Plan of Care (Addendum)
Called in prescription to [facility] [phone]) for Clozapine 50 mg qAM and 75 mg qHS on 9/19 at 11:45 AM.  Rosezetta Schlatter, MD PGY-2 11/17/2021  11:43 AM [place] Department of Psychiatry

## 2021-11-21 ENCOUNTER — Emergency Department (HOSPITAL_COMMUNITY)
Admission: EM | Admit: 2021-11-21 | Discharge: 2021-11-21 | Disposition: A | Discharge status: Home / Self Care | Payer: Medicaid Other | Attending: Emergency Medicine | Admitting: Emergency Medicine

## 2021-11-21 ENCOUNTER — Other Ambulatory Visit: Payer: Self-pay

## 2021-11-21 ENCOUNTER — Emergency Department (HOSPITAL_COMMUNITY): Payer: Medicaid Other

## 2021-11-21 DIAGNOSIS — M79604 Pain in right leg: Secondary | ICD-10-CM | POA: Insufficient documentation

## 2021-11-21 DIAGNOSIS — R1084 Generalized abdominal pain: Secondary | ICD-10-CM | POA: Insufficient documentation

## 2021-11-21 DIAGNOSIS — Z5321 Procedure and treatment not carried out due to patient leaving prior to being seen by health care provider: Secondary | ICD-10-CM | POA: Insufficient documentation

## 2021-11-21 NOTE — ED Triage Notes (Signed)
BIBA from [facility] for right leg pain and generalized abd pain x few days. Ems reports left AMA from nursing facility this am.  Pt requesting food and drink on arrival.

## 2021-11-21 NOTE — ED Notes (Signed)
Pt hands nurse stickers and says she is leaving, explained the risk of leaving.

## 2021-11-21 NOTE — ED Provider Triage Note (Signed)
Emergency Medicine Provider Triage Evaluation Note  Teresa Murillo , a 61 y.o. female  was evaluated in triage.  Pt complains of right lower leg pain.  Denies recent injury, trauma, fall.  Notes that she has been evaluated before for her leg pain where she was placed in a cast that was switched out to an Aircast.  No fever or redness noted.  Review of Systems  Positive:  Negative:   Physical Exam  BP (!) 140/65 (BP Location: Left Arm)   Pulse 92   Temp 98.8 F (37.1 C) (Oral)   Resp 16   Ht 5\' 5"  (1.651 m)   Wt 61 kg   SpO2 96%   BMI 22.38 kg/m  Gen:   Awake, no distress   Resp:  Normal effort  MSK:   Moves extremities without difficulty  Other:  Patient able to ambulate without assistance or difficulty in the emergency department multiple times.  No overlying skin changes.  Pedal pulses intact.  Full range of motion of lower extremity.  Medical Decision Making  Medically screening exam initiated at 1:50 PM.  Appropriate orders placed.  Teresa Murillo was informed that the remainder of the evaluation will be completed by another provider, this initial triage assessment does not replace that evaluation, and the importance of remaining in the ED until their evaluation is complete.  Work-up initiated   Folasade Mooty A, PA-C 11/21/21 1351

## 2021-11-22 ENCOUNTER — Emergency Department (HOSPITAL_COMMUNITY): Payer: Medicaid Other

## 2021-11-22 ENCOUNTER — Emergency Department (HOSPITAL_COMMUNITY)
Admission: EM | Admit: 2021-11-22 | Discharge: 2021-11-22 | Disposition: A | Discharge status: Home / Self Care | Payer: Medicaid Other | Attending: Emergency Medicine | Admitting: Emergency Medicine

## 2021-11-22 ENCOUNTER — Encounter (HOSPITAL_COMMUNITY): Payer: Self-pay | Admitting: Emergency Medicine

## 2021-11-22 ENCOUNTER — Emergency Department (HOSPITAL_COMMUNITY)
Admission: EM | Admit: 2021-11-22 | Discharge: 2021-11-23 | Discharge status: Against Medical Advice | Payer: Medicaid Other | Source: Home / Self Care

## 2021-11-22 ENCOUNTER — Other Ambulatory Visit: Payer: Self-pay

## 2021-11-22 ENCOUNTER — Encounter (HOSPITAL_COMMUNITY): Payer: Self-pay

## 2021-11-22 DIAGNOSIS — M25571 Pain in right ankle and joints of right foot: Secondary | ICD-10-CM | POA: Diagnosis not present

## 2021-11-22 DIAGNOSIS — R000 Tachycardia, unspecified: Secondary | ICD-10-CM | POA: Diagnosis not present

## 2021-11-22 DIAGNOSIS — M79604 Pain in right leg: Secondary | ICD-10-CM | POA: Diagnosis not present

## 2021-11-22 DIAGNOSIS — Z7984 Long term (current) use of oral hypoglycemic drugs: Secondary | ICD-10-CM | POA: Diagnosis not present

## 2021-11-22 DIAGNOSIS — M79606 Pain in leg, unspecified: Secondary | ICD-10-CM | POA: Insufficient documentation

## 2021-11-22 DIAGNOSIS — Z7901 Long term (current) use of anticoagulants: Secondary | ICD-10-CM | POA: Diagnosis not present

## 2021-11-22 DIAGNOSIS — Z794 Long term (current) use of insulin: Secondary | ICD-10-CM | POA: Diagnosis not present

## 2021-11-22 DIAGNOSIS — Z5321 Procedure and treatment not carried out due to patient leaving prior to being seen by health care provider: Secondary | ICD-10-CM | POA: Insufficient documentation

## 2021-11-22 DIAGNOSIS — I4891 Unspecified atrial fibrillation: Secondary | ICD-10-CM | POA: Insufficient documentation

## 2021-11-22 LAB — CBG MONITORING, ED: Glucose-Capillary: 169 mg/dL — ABNORMAL HIGH (ref 70–99)

## 2021-11-22 MED ORDER — ACETAMINOPHEN 325 MG PO TABS
650.0000 mg | ORAL_TABLET | Freq: Once | ORAL | Status: AC
  Administered 2021-11-22: 650 mg via ORAL
  Filled 2021-11-22: qty 2

## 2021-11-22 NOTE — ED Triage Notes (Signed)
Pt BIB [facility]. [employer] called them because she was knocking on stranger's doors. Complaining of chronic leg pain. States that she has been walking around a lot today.

## 2021-11-22 NOTE — ED Provider Notes (Signed)
[facility] DEPT Provider Note   CSN: [id number] Arrival date & time: 11/22/21  0117     History  Chief Complaint  Patient presents with   Leg Pain    R    Teresa Murillo is a 61 y.o. female.  61 year old female with a past medical history of A-fib presents to the ED with a chief complaint of right ankle pain.  Patient reports she fell off a car approximately a year ago.  Has continued to have residual pain to the right leg.  Today she is endorsing pain along the right ankle especially with ambulation along with weightbearing.  She has not taken any medication for improvement in symptoms.  On arrival, patient is requesting food, she is currently at a facility.  Denies any additional falls, no trauma or injury.  The history is provided by the patient.  Leg Pain Location:  Ankle Ankle location:  R ankle Associated symptoms: no fever        Home Medications Prior to Admission medications   Medication Sig Start Date End Date Taking? Authorizing Provider  Accu-Chek Softclix Lancets lancets Use as directed up to four times daily 11/16/21   Thurnell Lose, MD  apixaban (ELIQUIS) 5 MG TABS tablet Take 1 tablet (5 mg total) by mouth 2 (two) times daily. 11/16/21   Thurnell Lose, MD  ARIPiprazole (ABILIFY) 10 MG tablet Take 1 tablet (10 mg total) by mouth daily. 11/17/21   Thurnell Lose, MD  Blood Glucose Monitoring Suppl (ACCU-CHEK GUIDE) w/Device KIT Use as directed up to four times daily 11/16/21   Thurnell Lose, MD  budesonide-formoterol [facility]) 160-4.5 MCG/ACT inhaler Inhale 2 puffs into the lungs in the morning and at bedtime. 01/08/21   [provider]  Cholecalciferol (VITAMIN D3) 1.25 MG (50000 UT) CAPS Take 50,000 Units by mouth every Thursday.    [provider]  cloZAPine (CLOZARIL) 25 MG tablet Take 3 tablets (75 mg total) by mouth at bedtime. 11/16/21   Thurnell Lose, MD  clozapine (CLOZARIL) 50 MG tablet  Take 1 tablet (50 mg total) by mouth daily. 11/17/21   Thurnell Lose, MD  diltiazem (CARDIZEM CD) 240 MG 24 hr capsule Take 1 capsule (240 mg total) by mouth daily. 11/17/21   Thurnell Lose, MD  divalproex (DEPAKOTE ER) 500 MG 24 hr tablet Take 1 tablet (500 mg total) by mouth 2 (two) times daily. 11/16/21   Thurnell Lose, MD  glucose blood test strip Use as directed up to four times daily 11/16/21   Thurnell Lose, MD  haloperidol (HALDOL) 10 MG tablet Take 1 tablet (10 mg total) by mouth 3 (three) times daily as needed for agitation (and psychotic symptoms). 11/16/21   Thurnell Lose, MD  INGREZZA 40 MG capsule Take 1 capsule (40 mg total) by mouth in the morning. 11/16/21   Thurnell Lose, MD  insulin aspart (NOVOLOG) 100 UNIT/ML FlexPen Before each meal 3 times a day, 140-199 - 2 units, 200-250 - 4 units, 251-299 - 6 units,  300-349 - 8 units,  350 or above 10 units. Insulin PEN if approved, provide syringes and needles if needed.Please switch to any approved short acting Insulin if needed. 11/16/21   Thurnell Lose, MD  insulin glargine (LANTUS) 100 UNIT/ML Solostar Pen Inject 12 Units into the skin daily. 11/17/21   Thurnell Lose, MD  Insulin Pen Needle 32G X 4 MM MISC Use 4 times a day  with insulin, 1 month supply. 11/16/21   Thurnell Lose, MD  latanoprost (XALATAN) 0.005 % ophthalmic solution Place 1 drop into both eyes at bedtime.    [provider]  metFORMIN (GLUCOPHAGE) 500 MG tablet Take 500 mg by mouth 2 (two) times daily with a meal.    [provider]  nicotine (NICODERM CQ - DOSED IN MG/24 HR) 7 mg/24hr patch Place 1 patch (7 mg total) onto the skin daily. 10/22/21   Corky Sox, MD  PROAIR HFA 108 [phone] Base) MCG/ACT inhaler Inhale 2 puffs into the lungs every 6 (six) hours as needed for wheezing or shortness of breath.    [provider]      Allergies    Penicillins and Penicillin g    Review of Systems   Review of Systems   Constitutional:  Negative for fever.  Musculoskeletal:  Positive for arthralgias.    Physical Exam Updated Vital Signs BP (!) 144/110 (BP Location: Right Arm)   Pulse 94   Temp 98.3 F (36.8 C) (Oral)   Resp 16   SpO2 99%  Physical Exam Vitals and nursing note reviewed.  Constitutional:      Appearance: Normal appearance.  HENT:     Head: Normocephalic and atraumatic.  Cardiovascular:     Rate and Rhythm: Tachycardia present.     Pulses:          Dorsalis pedis pulses are 2+ on the right side.       Posterior tibial pulses are 2+ on the right side.  Pulmonary:     Effort: Pulmonary effort is normal.  Abdominal:     General: Abdomen is flat.  Musculoskeletal:     Cervical back: Normal range of motion and neck supple.  Feet:     Right foot:     Skin integrity: Skin integrity normal. No skin breakdown, erythema or warmth.     Comments: DP, PT 2+ pulses throughout.  Sensation is intact throughout.  Pain with plantarflexion spared with dorsiflexion.no erythema or skin changes noted Skin:    General: Skin is warm and dry.  Neurological:     Mental Status: She is alert and oriented to person, place, and time.     ED Results / Procedures / Treatments   Labs (all labs ordered are listed, but only abnormal results are displayed) Labs Reviewed  CBG MONITORING, ED - Abnormal; Notable for the following components:      Result Value   Glucose-Capillary 169 (*)    All other components within normal limits    EKG None  Radiology DG Ankle Complete Right  Result Date: 11/22/2021 CLINICAL DATA:  61 year old female with lower extremity pain. EXAM: RIGHT ANKLE - COMPLETE 3+ VIEW COMPARISON:  Right tib fib series 11/21/2021 and earlier. FINDINGS: Healed chronic deformity of the distal right tibia and fibula with some heterotopic tibiofibular syndesmosis ossification. Mortise joint alignment appears maintained. Talar dome intact although mildly sclerotic, stable. Degenerative  spurring at the medial and lateral malleoli. No definite joint effusion. Calcaneus appears intact with degenerative spurring. No acute osseous abnormality identified. IMPRESSION: Chronic right ankle deformity. No acute osseous abnormality identified. Electronically Signed   By: Genevie Ann M.D.   On: 11/22/2021 07:33   DG Tibia/Fibula Right  Result Date: 11/21/2021 CLINICAL DATA:  RIGHT LOWER leg pain.  No known acute injury. EXAM: RIGHT TIBIA AND FIBULA - 2 VIEW COMPARISON:  02/09/2022 and prior radiographs FINDINGS: No acute fracture, subluxation or dislocation identified. Chronic changes  of the distal tibia/fibula and ankle again noted. Degenerative changes of the tibiotalar joint are present. No focal suspicious bony lesions are present. IMPRESSION: 1. No acute abnormality. 2. Chronic/degenerative changes of the distal tibia/fibula and ankle. Electronically Signed   By: Margarette Canada M.D.   On: 11/21/2021 14:22    Procedures Procedures    Medications Ordered in ED Medications  acetaminophen (TYLENOL) tablet 650 mg (650 mg Oral Given 11/22/21 7793)    ED Course/ Medical Decision Making/ A&P                           Medical Decision Making Amount and/or Complexity of Data Reviewed Radiology: ordered.  Risk OTC drugs.   Patient presents to the ED chief complaint of right ankle pain which is acute on chronic.  She did have a fall approximately a year ago off a vehicle.  Reports there is pain with ambulation, does not take any medication for chronic pain.  Was found by EMS knocking on her stories reporting pain.  Exam patient is overall nontoxic-appearing, heart rate is fast at 120, she does report compliance with medication that she is given these at the facility.  2+ DP, PT pulses sensation is intact throughout.  Good dorsiflexion and plantarflexion with some pain.  No signs of skin changes, no erythema to suggest cellulitis.  She is diabetic.   X-ray of the right ankle shows:  Chronic  right ankle deformity. No acute osseous abnormality  identified.   Results discussed with patient, she was given a brace for comfort.  Patient found to be tachycardic on arrival, CBG was checked which is within her baseline.  Heart rate rechecked manually at 94.  She is without any further complaints requesting discharge.  Stable for discharge.    Portions of this note were generated with [employer]. Dictation errors may occur despite best attempts at proofreading.  Final Clinical Impression(s) / ED Diagnoses Final diagnoses:  Acute right ankle pain    Rx / DC Orders ED Discharge Orders     None         Janeece Fitting, PA-C 11/22/21 0846    Elgie Congo, MD 11/22/21 1539

## 2021-11-22 NOTE — ED Triage Notes (Signed)
Pt BIB EMS with reports of leg pain x 1 yr.

## 2021-11-22 NOTE — Discharge Instructions (Addendum)
Your x-ray today did not show any acute findings.    May wear this brace for comfort and discontinue at your earliest convenience.

## 2021-11-23 ENCOUNTER — Ambulatory Visit (HOSPITAL_COMMUNITY)
Admission: EM | Admit: 2021-11-23 | Discharge: 2022-01-23 | Disposition: A | Discharge status: Other Acute Inpatient Hospital | Payer: No Typology Code available for payment source | Attending: Behavioral Health | Admitting: Behavioral Health

## 2021-11-23 ENCOUNTER — Encounter (HOSPITAL_COMMUNITY): Payer: Self-pay | Admitting: Registered Nurse

## 2021-11-23 DIAGNOSIS — R079 Chest pain, unspecified: Secondary | ICD-10-CM | POA: Insufficient documentation

## 2021-11-23 DIAGNOSIS — R4183 Borderline intellectual functioning: Secondary | ICD-10-CM | POA: Diagnosis not present

## 2021-11-23 DIAGNOSIS — Z7901 Long term (current) use of anticoagulants: Secondary | ICD-10-CM | POA: Insufficient documentation

## 2021-11-23 DIAGNOSIS — Z91199 Patient's noncompliance with other medical treatment and regimen due to unspecified reason: Secondary | ICD-10-CM

## 2021-11-23 DIAGNOSIS — R000 Tachycardia, unspecified: Secondary | ICD-10-CM | POA: Insufficient documentation

## 2021-11-23 DIAGNOSIS — E119 Type 2 diabetes mellitus without complications: Secondary | ICD-10-CM | POA: Diagnosis not present

## 2021-11-23 DIAGNOSIS — Z9189 Other specified personal risk factors, not elsewhere classified: Secondary | ICD-10-CM

## 2021-11-23 DIAGNOSIS — H409 Unspecified glaucoma: Secondary | ICD-10-CM | POA: Insufficient documentation

## 2021-11-23 DIAGNOSIS — F209 Schizophrenia, unspecified: Secondary | ICD-10-CM

## 2021-11-23 DIAGNOSIS — F259 Schizoaffective disorder, unspecified: Secondary | ICD-10-CM | POA: Insufficient documentation

## 2021-11-23 DIAGNOSIS — Z91148 Patient's other noncompliance with medication regimen for other reason: Secondary | ICD-10-CM | POA: Insufficient documentation

## 2021-11-23 DIAGNOSIS — R531 Weakness: Secondary | ICD-10-CM | POA: Insufficient documentation

## 2021-11-23 DIAGNOSIS — F17210 Nicotine dependence, cigarettes, uncomplicated: Secondary | ICD-10-CM | POA: Insufficient documentation

## 2021-11-23 DIAGNOSIS — Z5900 Homelessness unspecified: Secondary | ICD-10-CM

## 2021-11-23 DIAGNOSIS — R4189 Other symptoms and signs involving cognitive functions and awareness: Secondary | ICD-10-CM | POA: Diagnosis present

## 2021-11-23 DIAGNOSIS — J449 Chronic obstructive pulmonary disease, unspecified: Secondary | ICD-10-CM | POA: Insufficient documentation

## 2021-11-23 DIAGNOSIS — Z593 Problems related to living in residential institution: Secondary | ICD-10-CM | POA: Insufficient documentation

## 2021-11-23 DIAGNOSIS — Z7984 Long term (current) use of oral hypoglycemic drugs: Secondary | ICD-10-CM | POA: Insufficient documentation

## 2021-11-23 DIAGNOSIS — I10 Essential (primary) hypertension: Secondary | ICD-10-CM | POA: Insufficient documentation

## 2021-11-23 DIAGNOSIS — Z1152 Encounter for screening for COVID-19: Secondary | ICD-10-CM | POA: Insufficient documentation

## 2021-11-23 DIAGNOSIS — R52 Pain, unspecified: Secondary | ICD-10-CM | POA: Insufficient documentation

## 2021-11-23 DIAGNOSIS — Z794 Long term (current) use of insulin: Secondary | ICD-10-CM | POA: Insufficient documentation

## 2021-11-23 DIAGNOSIS — E785 Hyperlipidemia, unspecified: Secondary | ICD-10-CM | POA: Insufficient documentation

## 2021-11-23 DIAGNOSIS — Z79899 Other long term (current) drug therapy: Secondary | ICD-10-CM | POA: Insufficient documentation

## 2021-11-23 LAB — CBC WITH DIFFERENTIAL/PLATELET
Abs Immature Granulocytes: 0.01 10*3/uL (ref 0.00–0.07)
Basophils Absolute: 0.1 10*3/uL (ref 0.0–0.1)
Basophils Relative: 1 %
Eosinophils Absolute: 0.1 10*3/uL (ref 0.0–0.5)
Eosinophils Relative: 2 %
HCT: 34.6 % — ABNORMAL LOW (ref 36.0–46.0)
Hemoglobin: 11.1 g/dL — ABNORMAL LOW (ref 12.0–15.0)
Immature Granulocytes: 0 %
Lymphocytes Relative: 31 %
Lymphs Abs: 2.3 10*3/uL (ref 0.7–4.0)
MCH: 26.1 pg (ref 26.0–34.0)
MCHC: 32.1 g/dL (ref 30.0–36.0)
MCV: 81.4 fL (ref 80.0–100.0)
Monocytes Absolute: 0.5 10*3/uL (ref 0.1–1.0)
Monocytes Relative: 7 %
Neutro Abs: 4.3 10*3/uL (ref 1.7–7.7)
Neutrophils Relative %: 59 %
Platelets: 365 10*3/uL (ref 150–400)
RBC: 4.25 MIL/uL (ref 3.87–5.11)
RDW: 17.7 % — ABNORMAL HIGH (ref 11.5–15.5)
WBC: 7.4 10*3/uL (ref 4.0–10.5)
nRBC: 0 % (ref 0.0–0.2)

## 2021-11-23 LAB — COMPREHENSIVE METABOLIC PANEL
ALT: 18 U/L (ref 0–44)
AST: 24 U/L (ref 15–41)
Albumin: 3.6 g/dL (ref 3.5–5.0)
Alkaline Phosphatase: 62 U/L (ref 38–126)
Anion gap: 8 (ref 5–15)
BUN: 5 mg/dL — ABNORMAL LOW (ref 6–20)
CO2: 26 mmol/L (ref 22–32)
Calcium: 9 mg/dL (ref 8.9–10.3)
Chloride: 105 mmol/L (ref 98–111)
Creatinine, Ser: 0.51 mg/dL (ref 0.44–1.00)
GFR, Estimated: >60 mL/min (ref >60)
Glucose, Bld: 101 mg/dL — ABNORMAL HIGH (ref 70–99)
Potassium: 4.1 mmol/L (ref 3.5–5.1)
Sodium: 139 mmol/L (ref 135–145)
Total Bilirubin: 0.4 mg/dL (ref 0.3–1.2)
Total Protein: 6.7 g/dL (ref 6.5–8.1)

## 2021-11-23 LAB — POCT URINE DRUG SCREEN - MANUAL ENTRY (I-SCREEN)
POC Amphetamine UR: NOT DETECTED
POC Buprenorphine (BUP): NOT DETECTED
POC Cocaine UR: NOT DETECTED
POC Marijuana UR: NOT DETECTED
POC Methadone UR: NOT DETECTED
POC Methamphetamine UR: NOT DETECTED
POC Morphine: NOT DETECTED
POC Oxazepam (BZO): NOT DETECTED
POC Oxycodone UR: NOT DETECTED
POC Secobarbital (BAR): NOT DETECTED

## 2021-11-23 LAB — URINALYSIS, ROUTINE W REFLEX MICROSCOPIC
Bacteria, UA: NONE SEEN
Bilirubin Urine: NEGATIVE
Glucose, UA: NEGATIVE mg/dL
Hgb urine dipstick: NEGATIVE
Ketones, ur: NEGATIVE mg/dL
Leukocytes,Ua: NEGATIVE
Nitrite: NEGATIVE
Protein, ur: NEGATIVE mg/dL
Specific Gravity, Urine: 1.025 (ref 1.005–1.030)
pH: 6.5 (ref 5.0–8.0)

## 2021-11-23 LAB — LIPID PANEL
Cholesterol: 171 mg/dL (ref 0–200)
HDL: 65 mg/dL (ref >40)
LDL Cholesterol: 81 mg/dL (ref 0–99)
Total CHOL/HDL Ratio: 2.6 RATIO
Triglycerides: 125 mg/dL (ref <150)
VLDL: 25 mg/dL (ref 0–40)

## 2021-11-23 LAB — RESP PANEL BY RT-PCR (FLU A&B, COVID) ARPGX2
Influenza A by PCR: NEGATIVE
Influenza B by PCR: NEGATIVE
SARS Coronavirus 2 by RT PCR: NEGATIVE

## 2021-11-23 LAB — POC SARS CORONAVIRUS 2 AG: SARSCOV2ONAVIRUS 2 AG: NEGATIVE

## 2021-11-23 LAB — MAGNESIUM: Magnesium: 2 mg/dL (ref 1.7–2.4)

## 2021-11-23 LAB — TSH: TSH: 1.788 u[IU]/mL (ref 0.350–4.500)

## 2021-11-23 LAB — ETHANOL: Alcohol, Ethyl (B): <10 mg/dL (ref <10)

## 2021-11-23 MED ORDER — ARIPIPRAZOLE 10 MG PO TABS
10.0000 mg | ORAL_TABLET | Freq: Every day | ORAL | Status: DC
  Administered 2021-11-23: 10 mg via ORAL
  Filled 2021-11-23: qty 1

## 2021-11-23 MED ORDER — DILTIAZEM HCL ER COATED BEADS 120 MG PO CP24
240.0000 mg | ORAL_CAPSULE | Freq: Every day | ORAL | Status: DC
  Administered 2021-11-24 – 2022-01-23 (×61): 240 mg via ORAL
  Filled 2021-11-23 (×61): qty 2

## 2021-11-23 MED ORDER — MAGNESIUM HYDROXIDE 400 MG/5ML PO SUSP: 30.0000 mL | Freq: Every day | ORAL | Status: DC | PRN

## 2021-11-23 MED ORDER — VITAMIN D3 1.25 MG (50000 UT) PO CAPS
50000.0000 [IU] | ORAL_CAPSULE | ORAL | Status: DC
  Administered 2021-11-26: 50000 [IU] via ORAL
  Filled 2021-11-23: qty 1

## 2021-11-23 MED ORDER — ALUM & MAG HYDROXIDE-SIMETH 200-200-20 MG/5ML PO SUSP
30.0000 mL | ORAL | Status: DC | PRN
  Administered 2021-12-19: 30 mL via ORAL
  Filled 2021-11-23: qty 30

## 2021-11-23 MED ORDER — ACETAMINOPHEN 325 MG PO TABS
650.0000 mg | ORAL_TABLET | Freq: Four times a day (QID) | ORAL | Status: DC | PRN
  Administered 2021-11-25 – 2022-01-22 (×18): 650 mg via ORAL
  Filled 2021-11-23 (×18): qty 2

## 2021-11-23 MED ORDER — INSULIN ASPART 100 UNIT/ML IJ SOLN
0.0000 [IU] | Freq: Three times a day (TID) | INTRAMUSCULAR | Status: DC
  Administered 2021-11-24: 2 [IU] via SUBCUTANEOUS
  Administered 2021-11-24: 8 [IU] via SUBCUTANEOUS
  Administered 2021-11-25: 2 [IU] via SUBCUTANEOUS
  Administered 2021-11-25: 3 [IU] via SUBCUTANEOUS
  Administered 2021-11-26 – 2021-11-27 (×5): 2 [IU] via SUBCUTANEOUS
  Administered 2021-11-28 – 2021-11-29 (×5): 3 [IU] via SUBCUTANEOUS
  Administered 2021-11-30 – 2021-12-01 (×2): 2 [IU] via SUBCUTANEOUS
  Administered 2021-12-01 – 2021-12-02 (×4): 3 [IU] via SUBCUTANEOUS
  Administered 2021-12-03 – 2021-12-04 (×2): 5 [IU] via SUBCUTANEOUS
  Administered 2021-12-04: 3 [IU] via SUBCUTANEOUS
  Administered 2021-12-05: 2 [IU] via SUBCUTANEOUS
  Administered 2021-12-05 (×2): 3 [IU] via SUBCUTANEOUS
  Administered 2021-12-06: 2 [IU] via SUBCUTANEOUS
  Administered 2021-12-06: 3 [IU] via SUBCUTANEOUS
  Administered 2021-12-07 – 2021-12-08 (×2): 2 [IU] via SUBCUTANEOUS
  Administered 2021-12-08 – 2021-12-09 (×2): 3 [IU] via SUBCUTANEOUS
  Administered 2021-12-10: 8 [IU] via SUBCUTANEOUS

## 2021-11-23 MED ORDER — HALOPERIDOL 5 MG PO TABS: 10.0000 mg | ORAL_TABLET | Freq: Three times a day (TID) | ORAL | Status: DC | PRN

## 2021-11-23 MED ORDER — INSULIN GLARGINE-YFGN 100 UNIT/ML ~~LOC~~ SOLN: 10.0000 [IU] | Freq: Every day | SUBCUTANEOUS | Status: DC

## 2021-11-23 MED ORDER — MUPIROCIN 2 % EX OINT
1.0000 | TOPICAL_OINTMENT | Freq: Three times a day (TID) | CUTANEOUS | Status: DC
  Administered 2021-11-23 – 2021-12-13 (×50): 1 via TOPICAL
  Filled 2021-11-23 (×8): qty 22

## 2021-11-23 MED ORDER — CLOZAPINE 25 MG PO TABS: 75.0000 mg | ORAL_TABLET | Freq: Every day | ORAL | Status: DC

## 2021-11-23 MED ORDER — LATANOPROST 0.005 % OP SOLN
1.0000 [drp] | Freq: Every day | OPHTHALMIC | Status: DC
  Administered 2021-11-24 – 2022-01-22 (×59): 1 [drp] via OPHTHALMIC
  Filled 2021-11-23 (×3): qty 2.5

## 2021-11-23 MED ORDER — CLOZAPINE 25 MG PO TABS: 50.0000 mg | ORAL_TABLET | Freq: Every day | ORAL | Status: DC

## 2021-11-23 MED ORDER — DIVALPROEX SODIUM ER 500 MG PO TB24
500.0000 mg | ORAL_TABLET | Freq: Two times a day (BID) | ORAL | Status: DC
  Administered 2021-11-23 – 2022-01-23 (×122): 500 mg via ORAL
  Filled 2021-11-23 (×123): qty 1

## 2021-11-23 MED ORDER — ALBUTEROL SULFATE HFA 108 (90 BASE) MCG/ACT IN AERS
2.0000 | INHALATION_SPRAY | Freq: Four times a day (QID) | RESPIRATORY_TRACT | Status: DC | PRN
  Administered 2021-12-09: 2 via RESPIRATORY_TRACT
  Filled 2021-11-23: qty 6.7

## 2021-11-23 MED ORDER — METFORMIN HCL 500 MG PO TABS
500.0000 mg | ORAL_TABLET | Freq: Two times a day (BID) | ORAL | Status: DC
  Administered 2021-11-24 – 2021-12-11 (×34): 500 mg via ORAL
  Filled 2021-11-23 (×35): qty 1

## 2021-11-23 MED ORDER — APIXABAN 5 MG PO TABS
5.0000 mg | ORAL_TABLET | Freq: Two times a day (BID) | ORAL | Status: DC
  Administered 2021-11-23 – 2022-01-23 (×122): 5 mg via ORAL
  Filled 2021-11-23 (×137): qty 1

## 2021-11-23 MED ORDER — INSULIN GLARGINE-YFGN 100 UNIT/ML ~~LOC~~ SOLN: 100.0000 [IU] | Freq: Every day | SUBCUTANEOUS | Status: DC

## 2021-11-23 MED ORDER — FLUTICASONE FUROATE-VILANTEROL 200-25 MCG/ACT IN AEPB
1.0000 | INHALATION_SPRAY | Freq: Every day | RESPIRATORY_TRACT | Status: DC
  Administered 2021-11-24 – 2022-01-23 (×61): 1 via RESPIRATORY_TRACT
  Filled 2021-11-23 (×4): qty 28

## 2021-11-23 MED ORDER — NICOTINE 7 MG/24HR TD PT24
7.0000 mg | MEDICATED_PATCH | Freq: Every day | TRANSDERMAL | Status: DC
  Administered 2021-11-24 – 2022-01-23 (×61): 7 mg via TRANSDERMAL
  Filled 2021-11-23 (×61): qty 1

## 2021-11-23 NOTE — ED Notes (Signed)
Pt resting, easy to arouse. Denies SI/HI. No c/o pain. A/O.

## 2021-11-23 NOTE — ED Notes (Signed)
[facility] lab courier called to transport labs to [facility] lab 

## 2021-11-23 NOTE — ED Notes (Addendum)
Pt sleeping, no noted distress. Will continue to monitor for safety 

## 2021-11-23 NOTE — ED Provider Notes (Cosign Needed Addendum)
[facility]gent [facility] Continuous Assessment Admission H&P  Date: 11/23/21 Patient Name: Teresa Murillo MRN: [id number] Chief Complaint: No chief complaint on file.     Diagnoses:  Final diagnoses:  At risk for self care deficit  Noncompliance  Self-care deficit for medication administration  Schizophrenia, unspecified type ([place])    HPI: Teresa Murillo is a 61 year old female with a past psychiatric history of schizophrenia, aggressive behavior, and possible intellectual disability presented to [facility] as a walk in via [facility] voluntarily.    Patient has bee seen in [facility] ED on 3 separate occasions with medical complaint of leg and foot.  She was discharged earlier this morning when she returned to the group home [facility] care.  States that she was not allowed in the group home, so she sat on the porch.  Reports that the owner of group home arrived at same time as police, and she was brought here.  States she was brought here "Because they say I'm not taking my medicine.  Patient states it has been 4 days since she has had a shower or taken any of her medications.  States that while in the emergency room she wasn't given any of her medications or allowed to take a shower "I just slept in a chair with a blanket pulled over me."  Patient states that she doesn't know why she is not allowed back in the group home because she hasn't done anything wrong.  Gave permission to speak with owner [name] at [phone]. Patient denies suicidal/self-harm/homicidal ideation, psychosis, and paranoia.  States she has been diagnosed with schizophrenia, but she is not having any symptoms at this time.  Reports she has outpatient psychiatric services with [facility].   During evaluation Teresa Murillo is sitting up right in chair with no noted distress.  She is alert/oriented x 4; calm, cooperative, and her mood is euthymic with congruent affect.  She spoke clear tone at moderate volume, and normal  pace; with good eye contact.  Her thought process is coherent, relevant, and there is no indication that she is currently responding to internal/external stimuli or experiencing delusional thought content.   Objectively there is no evidence of psychosis/mania or delusional thinking.  Patient is able to converse coherently, goal directed thoughts, no distractibility, or pre-occupation.  She also denies suicidal/self-harm/homicidal ideation, psychosis, and paranoia.  Patient answered question appropriately.     Collateral Information:  TTS counselor spoke to [place] reports patients last visit with [facility] was last Thursday 11/19/21.  States that patient was given a 30-day notice that was up around the end of July 2023 but has agreed several times to allow patient back on a temporary basis because didn't want her to be homeless and to give DSS and care coordinator to find placement for her.  States that patient is her own guardian, and they can't stop her when she wants to leave or stop the multiple visits to the hospital.  States that patient is no longer able to return to the facility and can get her belongings when she is discharged from the hospital.  States that she has informed DSS that patient needs a guardian related to patient needing reminders to take a bath, and that patient is unable to administer herself her medications.  Several names of individuals she has been working with on the placement of patient was given (See TTS counselor [facility] Note for details).    PHQ 2-9:   [facility] ED  from 11/22/2021 in [place] DEPT Most recent reading at 11/22/2021  8:28 PM ED from 11/22/2021 in [place] DEPT Most recent reading at 11/22/2021  1:24 AM ED to Hosp-Admission (Discharged) from 11/08/2021 in [facility] Most recent reading at 11/08/2021 10:09 AM  C-SSRS RISK CATEGORY No Risk No Risk No Risk        Total Time  spent with patient: 30 minutes  Musculoskeletal  Strength & Muscle Tone: within normal limits Gait & Station: normal Patient leans: N/A  Psychiatric Specialty Exam  Presentation General Appearance: Disheveled (soiled clothing and malodorous)  Eye Contact:Good  Speech:Clear and Coherent; Normal Rate  Speech Volume:Normal  Handedness:Right   Mood and Affect  Mood:Euthymic  Affect:Congruent   Thought Process  Thought Processes:Coherent; Linear  Descriptions of Associations:Intact  Orientation:Full (Time, Place and Person)  Thought Content:Logical  Diagnosis of Schizophrenia or Schizoaffective disorder in past: Yes  Duration of Psychotic Symptoms: No data recorded Hallucinations:Hallucinations: None  Ideas of Reference:None  Suicidal Thoughts:Suicidal Thoughts: No  Homicidal Thoughts:Homicidal Thoughts: No   Sensorium  Memory:Immediate Fair; Recent Fair  Judgment:Poor  Insight:Lacking   Executive Functions  Concentration:Fair  Attention Span:Fair  [place] of Knowledge:Poor  Language:Fair   Psychomotor Activity  Psychomotor Activity:Psychomotor Activity: Normal   Assets  Assets:Communication Skills; Desire for Improvement; Financial Resources/Insurance   Sleep  Sleep:Sleep: Fair   Nutritional Assessment (For OBS and FBC admissions only) Has the patient had a weight loss or gain of 10 pounds or more in the last 3 months?: No Has the patient had a decrease in food intake/or appetite?: No Does the patient have dental problems?: No Does the patient have eating habits or behaviors that may be indicators of an eating disorder including binging or inducing vomiting?: No Has the patient recently lost weight without trying?: 0 Has the patient been eating poorly because of a decreased appetite?: 0 Malnutrition Screening Tool Score: 0    Physical Exam Vitals and nursing note (soiled clothing and malodorous) reviewed.  Constitutional:       General: She is not in acute distress.    Appearance: She is obese. She is not ill-appearing.  HENT:     Head: Normocephalic.  Eyes:     Pupils: Pupils are equal, round, and reactive to light.  Cardiovascular:     Rate and Rhythm: Normal rate.  Pulmonary:     Effort: Pulmonary effort is normal.  Musculoskeletal:        General: Normal range of motion.     Cervical back: Normal range of motion.  Skin:    General: Skin is warm and dry.  Neurological:     Mental Status: She is alert and oriented to person, place, and time.  Psychiatric:        Attention and Perception: Attention and perception normal. She does not perceive auditory or visual hallucinations.        Mood and Affect: Mood and affect normal.        Speech: Speech normal.        Behavior: Behavior normal. Behavior is cooperative.        Thought Content: Thought content normal. Thought content is not paranoid or delusional. Thought content does not include homicidal or suicidal ideation.        Judgment: Judgment is impulsive.    Review of Systems  Constitutional: Negative.   HENT: Negative.    Eyes: Negative.   Respiratory: Negative.  Negative for cough,  shortness of breath and wheezing.   Cardiovascular: Negative.  Negative for chest pain.  Gastrointestinal: Negative.  Negative for abdominal pain, diarrhea and nausea.  Genitourinary: Negative.  Negative for dysuria.  Musculoskeletal:        Complaints of right foot pain.  Blister lateral R side of right foot.  States she is also having pain in her ankle that was broken a year ago.  Reports x-rays done yesterday and was informed of old break.   Skin: Negative.   Neurological: Negative.  Negative for dizziness, tremors, loss of consciousness and weakness.  Endo/Heme/Allergies: Negative.   Psychiatric/Behavioral:  Depression: Stable. Hallucinations: Denies. Substance abuse: Denies. Suicidal ideas: Denies. The patient has insomnia. Nervous/anxious: Denies.        Patient stating that she was brought here by police because group home wouldn't let her in "The said I won't taking my medicine so police brought me here."     Blood pressure 121/65, pulse (!) 110, temperature 98.9 F (37.2 C), temperature source Oral, resp. rate 18, SpO2 95 %. There is no height or weight on file to calculate BMI.  Past Psychiatric History: Schizophrenia, aggressive behavior, and possible intellectual disability   Is the patient at risk to self? No  Has the patient been a risk to self in the past 6 months? No .    Has the patient been a risk to self within the distant past? No   Is the patient a risk to others? No   Has the patient been a risk to others in the past 6 months? No   Has the patient been a risk to others within the distant past? No   Past Medical History:  Past Medical History:  Diagnosis Date   Chronic obstructive pulmonary disease (COPD) ([place])    Glaucoma    Hyperlipidemia    Hypertension    Iron deficiency    Schizoaffective disorder ([place])    Type 2 diabetes mellitus ([place])     Past Surgical History:  Procedure Laterality Date   TUBAL LIGATION      Family History:  Family History  Problem Relation Age of Onset   Breast cancer Maternal Grandmother     Social History:  Social History   Socioeconomic History   Marital status: Divorced    Spouse name: Not on file   Number of children: Not on file   Years of education: Not on file   Highest education level: Not on file  Occupational History   Not on file  Tobacco Use   Smoking status: Every Day    Packs/day: 1.00    Types: Cigars, Cigarettes   Smokeless tobacco: Current  Vaping Use   Vaping Use: Never used  Substance and Sexual Activity   Alcohol use: Yes   Drug use: Not Currently   Sexual activity: Not Currently    Birth control/protection: Surgical  Other Topics Concern   Not on file  Social History Narrative   Not on file   Social Determinants of Health   Financial  Resource Strain: Not on file  Food Insecurity: Not on file  Transportation Needs: Not on file  Physical Activity: Not on file  Stress: Not on file  Social Connections: Not on file  Intimate Partner Violence: Not on file    SDOH:  SDOH Screenings   Tobacco Use: High Risk (11/23/2021)    Last Labs:  Admission on 11/23/2021  Component Date Value Ref Range Status   POC Amphetamine UR 11/23/2021 None  Detected  NONE DETECTED (Cut Off Level 1000 ng/mL) Final   POC Secobarbital (BAR) 11/23/2021 None Detected  NONE DETECTED (Cut Off Level 300 ng/mL) Final   POC Buprenorphine (BUP) 11/23/2021 None Detected  NONE DETECTED (Cut Off Level 10 ng/mL) Final   POC Oxazepam (BZO) 11/23/2021 None Detected  NONE DETECTED (Cut Off Level 300 ng/mL) Final   POC Cocaine UR 11/23/2021 None Detected  NONE DETECTED (Cut Off Level 300 ng/mL) Final   POC Methamphetamine UR 11/23/2021 None Detected  NONE DETECTED (Cut Off Level 1000 ng/mL) Final   POC Morphine 11/23/2021 None Detected  NONE DETECTED (Cut Off Level 300 ng/mL) Final   POC Methadone UR 11/23/2021 None Detected  NONE DETECTED (Cut Off Level 300 ng/mL) Final   POC Oxycodone UR 11/23/2021 None Detected  NONE DETECTED (Cut Off Level 100 ng/mL) Final   POC Marijuana UR 11/23/2021 None Detected  NONE DETECTED (Cut Off Level 50 ng/mL) Final   SARSCOV2ONAVIRUS 2 AG 11/23/2021 NEGATIVE  NEGATIVE Final   Comment: (NOTE) SARS-CoV-2 antigen NOT DETECTED.   Negative results are presumptive.  Negative results do not preclude SARS-CoV-2 infection and should not be used as the sole basis for treatment or other patient management decisions, including infection  control decisions, particularly in the presence of clinical signs and  symptoms consistent with COVID-19, or in those who have been in contact with the virus.  Negative results must be combined with clinical observations, patient history, and epidemiological information. The expected result is  Negative.  Fact Sheet for Patients: HandmadeRecipes.com.cy  Fact Sheet for Healthcare Providers: FuneralLife.at  This test is not yet approved or cleared by the [place] FDA and  has been authorized for detection and/or diagnosis of SARS-CoV-2 by FDA under an Emergency Use Authorization (EUA).  This EUA will remain in effect (meaning this test can be used) for the duration of  the COV                          ID-19 declaration under Section 564(b)(1) of the Act, 21 U.S.C. section 360bbb-3(b)(1), unless the authorization is terminated or revoked sooner.    Admission on 11/22/2021, Discharged on 11/22/2021  Component Date Value Ref Range Status   Glucose-Capillary 11/22/2021 169 (H)  70 - 99 mg/dL Final   Glucose reference range applies only to samples taken after fasting for at least 8 hours.  No results displayed because visit has over 200 results.    Admission on 10/04/2021, Discharged on 10/22/2021  Component Date Value Ref Range Status   SARS Coronavirus 2 by RT PCR 10/04/2021 NEGATIVE  NEGATIVE Final   Comment: (NOTE) SARS-CoV-2 target nucleic acids are NOT DETECTED.  The SARS-CoV-2 RNA is generally detectable in upper respiratory specimens during the acute phase of infection. The lowest concentration of SARS-CoV-2 viral copies this assay can detect is 138 copies/mL. A negative result does not preclude SARS-Cov-2 infection and should not be used as the sole basis for treatment or other patient management decisions. A negative result may occur with  improper specimen collection/handling, submission of specimen other than nasopharyngeal swab, presence of viral mutation(s) within the areas targeted by this assay, and inadequate number of viral copies(<138 copies/mL). A negative result must be combined with clinical observations, patient history, and epidemiological information. The expected result is Negative.  Fact Sheet  for Patients:  EntrepreneurPulse.com.au  Fact Sheet for Healthcare Providers:  IncredibleEmployment.be  This test is no  t yet approved or cleared by the [place] and  has been authorized for detection and/or diagnosis of SARS-CoV-2 by FDA under an Emergency Use Authorization (EUA). This EUA will remain  in effect (meaning this test can be used) for the duration of the COVID-19 declaration under Section 564(b)(1) of the Act, 21 U.S.C.section 360bbb-3(b)(1), unless the authorization is terminated  or revoked sooner.       Influenza A by PCR 10/04/2021 NEGATIVE  NEGATIVE Final   Influenza B by PCR 10/04/2021 NEGATIVE  NEGATIVE Final   Comment: (NOTE) The Xpert Xpress SARS-CoV-2/FLU/RSV plus assay is intended as an aid in the diagnosis of influenza from Nasopharyngeal swab specimens and should not be used as a sole basis for treatment. Nasal washings and aspirates are unacceptable for Xpert Xpress SARS-CoV-2/FLU/RSV testing.  Fact Sheet for Patients: EntrepreneurPulse.com.au  Fact Sheet for Healthcare Providers: IncredibleEmployment.be  This test is not yet approved or cleared by the [place] FDA and has been authorized for detection and/or diagnosis of SARS-CoV-2 by FDA under an Emergency Use Authorization (EUA). This EUA will remain in effect (meaning this test can be used) for the duration of the COVID-19 declaration under Section 564(b)(1) of the Act, 21 U.S.C. section 360bbb-3(b)(1), unless the authorization is terminated or revoked.  Performed at [facility], [place] [address], [place], [place] [address]    WBC 10/04/2021 8.4  4.0 - 10.5 K/uL Final   RBC 10/04/2021 4.42  3.87 - 5.11 MIL/uL Final   Hemoglobin 10/04/2021 11.6 (L)  12.0 - 15.0 g/dL Final   HCT 10/04/2021 35.7 (L)  36.0 - 46.0 % Final   MCV 10/04/2021 80.8  80.0 - 100.0 fL Final   MCH  10/04/2021 26.2  26.0 - 34.0 pg Final   MCHC 10/04/2021 32.5  30.0 - 36.0 g/dL Final   RDW 10/04/2021 16.0 (H)  11.5 - 15.5 % Final   Platelets 10/04/2021 372  150 - 400 K/uL Final   nRBC 10/04/2021 0.0  0.0 - 0.2 % Final   Neutrophils Relative % 10/04/2021 68  % Final   Neutro Abs 10/04/2021 5.7  1.7 - 7.7 K/uL Final   Lymphocytes Relative 10/04/2021 22  % Final   Lymphs Abs 10/04/2021 1.8  0.7 - 4.0 K/uL Final   Monocytes Relative 10/04/2021 8  % Final   Monocytes Absolute 10/04/2021 0.7  0.1 - 1.0 K/uL Final   Eosinophils Relative 10/04/2021 1  % Final   Eosinophils Absolute 10/04/2021 0.1  0.0 - 0.5 K/uL Final   Basophils Relative 10/04/2021 1  % Final   Basophils Absolute 10/04/2021 0.1  0.0 - 0.1 K/uL Final   Immature Granulocytes 10/04/2021 0  % Final   Abs Immature Granulocytes 10/04/2021 0.03  0.00 - 0.07 K/uL Final   Performed at [facility], [place] [address], [place], [place] [address]   Sodium 10/04/2021 136  135 - 145 mmol/L Final   Potassium 10/04/2021 4.2  3.5 - 5.1 mmol/L Final   Chloride 10/04/2021 104  98 - 111 mmol/L Final   CO2 10/04/2021 25  22 - 32 mmol/L Final   Glucose, Bld 10/04/2021 100 (H)  70 - 99 mg/dL Final   Glucose reference range applies only to samples taken after fasting for at least 8 hours.   BUN 10/04/2021 9  6 - 20 mg/dL Final   Creatinine, Ser 10/04/2021 0.52  0.44 - 1.00 mg/dL Final   Calcium 10/04/2021 9.0  8.9 - 10.3 mg/dL Final  Total Protein 10/04/2021 7.0  6.5 - 8.1 g/dL Final   Albumin 10/04/2021 3.2 (L)  3.5 - 5.0 g/dL Final   AST 10/04/2021 13 (L)  15 - 41 U/L Final   ALT 10/04/2021 10  0 - 44 U/L Final   Alkaline Phosphatase 10/04/2021 61  38 - 126 U/L Final   Total Bilirubin 10/04/2021 0.3  0.3 - 1.2 mg/dL Final   GFR, Estimated 10/04/2021 >60  >60 mL/min Final   Comment: (NOTE) Calculated using the CKD-EPI Creatinine Equation (2021)    Anion gap 10/04/2021 7  5 - 15 Final   Performed at [facility], [place]  [address] [place], [place] [address]   Hgb A1c MFr Bld 10/04/2021 7.0 (H)  4.8 - 5.6 % Final   Comment: (NOTE) Pre diabetes:          5.7%-6.4%  Diabetes:              >6.4%  Glycemic control for   <7.0% adults with diabetes    Mean Plasma Glucose 10/04/2021 154.2  mg/dL Final   Performed at [facility], [place] [address], [place], [place] [address]   Cholesterol 10/04/2021 178  0 - 200 mg/dL Final   Triglycerides 10/04/2021 155 (H)  <150 mg/dL Final   HDL 10/04/2021 52  >40 mg/dL Final   Total CHOL/HDL Ratio 10/04/2021 3.4  RATIO Final   VLDL 10/04/2021 31  0 - 40 mg/dL Final   LDL Cholesterol 10/04/2021 95  0 - 99 mg/dL Final   Comment:        Total Cholesterol/HDL:CHD Risk Coronary Heart Disease Risk Table                     Men   Women  1/2 Average Risk   3.4   3.3  Average Risk       5.0   4.4  2 X Average Risk   9.6   7.1  3 X Average Risk  23.4   11.0        Use the calculated Patient Ratio above and the CHD Risk Table to determine the patient's CHD Risk.        ATP III CLASSIFICATION (LDL):  <100     mg/dL   Optimal  100-129  mg/dL   Near or Above                    Optimal  130-159  mg/dL   Borderline  160-189  mg/dL   High  >190     mg/dL   Very High Performed at [facility] [address], [place], [place] [address]    POC Amphetamine UR 10/04/2021 None Detected  NONE DETECTED (Cut Off Level 1000 ng/mL) Final   POC Secobarbital (BAR) 10/04/2021 None Detected  NONE DETECTED (Cut Off Level 300 ng/mL) Final   POC Buprenorphine (BUP) 10/04/2021 None Detected  NONE DETECTED (Cut Off Level 10 ng/mL) Final   POC Oxazepam (BZO) 10/04/2021 None Detected  NONE DETECTED (Cut Off Level 300 ng/mL) Final   POC Cocaine UR 10/04/2021 None Detected  NONE DETECTED (Cut Off Level 300 ng/mL) Final   POC Methamphetamine UR 10/04/2021 None Detected  NONE DETECTED (Cut Off Level 1000 ng/mL) Final   POC Morphine 10/04/2021 None Detected  NONE DETECTED (Cut Off Level 300 ng/mL)  Final   POC Methadone UR 10/04/2021 None Detected  NONE DETECTED (Cut Off Level 300 ng/mL) Final  POC Oxycodone UR 10/04/2021 Positive (A)  NONE DETECTED (Cut Off Level 100 ng/mL) Final   POC Marijuana UR 10/04/2021 None Detected  NONE DETECTED (Cut Off Level 50 ng/mL) Final   SARSCOV2ONAVIRUS 2 AG 10/04/2021 NEGATIVE  NEGATIVE Final   Comment: (NOTE) SARS-CoV-2 antigen NOT DETECTED.   Negative results are presumptive.  Negative results do not preclude SARS-CoV-2 infection and should not be used as the sole basis for treatment or other patient management decisions, including infection  control decisions, particularly in the presence of clinical signs and  symptoms consistent with COVID-19, or in those who have been in contact with the virus.  Negative results must be combined with clinical observations, patient history, and epidemiological information. The expected result is Negative.  Fact Sheet for Patients: HandmadeRecipes.com.cy  Fact Sheet for Healthcare Providers: FuneralLife.at  This test is not yet approved or cleared by the [place] FDA and  has been authorized for detection and/or diagnosis of SARS-CoV-2 by FDA under an Emergency Use Authorization (EUA).  This EUA will remain in effect (meaning this test can be used) for the duration of  the COV                          ID-19 declaration under Section 564(b)(1) of the Act, 21 U.S.C. section 360bbb-3(b)(1), unless the authorization is terminated or revoked sooner.     Valproic Acid Lvl 10/04/2021 51  50.0 - 100.0 ug/mL Final   Performed at [facility] [address], [place], [place] [address]   Valproic Acid Lvl 10/08/2021 57  50.0 - 100.0 ug/mL Final   Performed at [facility] [address] [place], [place] [address]   Glucose-Capillary 10/09/2021 123 (H)  70 - 99 mg/dL Final   Glucose reference range applies only to samples taken after fasting for  at least 8 hours.  Admission on 09/09/2021, Discharged on 09/10/2021  Component Date Value Ref Range Status   Sodium 09/09/2021 134 (L)  135 - 145 mmol/L Final   Potassium 09/09/2021 4.3  3.5 - 5.1 mmol/L Final   Chloride 09/09/2021 99  98 - 111 mmol/L Final   CO2 09/09/2021 25  22 - 32 mmol/L Final   Glucose, Bld 09/09/2021 107 (H)  70 - 99 mg/dL Final   Glucose reference range applies only to samples taken after fasting for at least 8 hours.   BUN 09/09/2021 12  6 - 20 mg/dL Final   Creatinine, Ser 09/09/2021 0.74  0.44 - 1.00 mg/dL Final   Calcium 09/09/2021 9.0  8.9 - 10.3 mg/dL Final   Total Protein 09/09/2021 7.4  6.5 - 8.1 g/dL Final   Albumin 09/09/2021 3.1 (L)  3.5 - 5.0 g/dL Final   AST 09/09/2021 13 (L)  15 - 41 U/L Final   ALT 09/09/2021 13  0 - 44 U/L Final   Alkaline Phosphatase 09/09/2021 66  38 - 126 U/L Final   Total Bilirubin 09/09/2021 0.2 (L)  0.3 - 1.2 mg/dL Final   GFR, Estimated 09/09/2021 >60  >60 mL/min Final   Comment: (NOTE) Calculated using the CKD-EPI Creatinine Equation (2021)    Anion gap 09/09/2021 10  5 - 15 Final   Performed at [facility], [place] [address] [place], [place] [address]   WBC 09/09/2021 8.5  4.0 - 10.5 K/uL Final   RBC 09/09/2021 4.53  3.87 - 5.11 MIL/uL Final   Hemoglobin 09/09/2021 11.8 (L)  12.0 -  15.0 g/dL Final   HCT 09/09/2021 38.0  36.0 - 46.0 % Final   MCV 09/09/2021 83.9  80.0 - 100.0 fL Final   MCH 09/09/2021 26.0  26.0 - 34.0 pg Final   MCHC 09/09/2021 31.1  30.0 - 36.0 g/dL Final   RDW 09/09/2021 17.8 (H)  11.5 - 15.5 % Final   Platelets 09/09/2021 442 (H)  150 - 400 K/uL Final   nRBC 09/09/2021 0.0  0.0 - 0.2 % Final   Neutrophils Relative % 09/09/2021 70  % Final   Neutro Abs 09/09/2021 5.9  1.7 - 7.7 K/uL Final   Lymphocytes Relative 09/09/2021 20  % Final   Lymphs Abs 09/09/2021 1.7  0.7 - 4.0 K/uL Final   Monocytes Relative 09/09/2021 8  % Final   Monocytes Absolute 09/09/2021 0.7  0.1 - 1.0 K/uL Final    Eosinophils Relative 09/09/2021 1  % Final   Eosinophils Absolute 09/09/2021 0.1  0.0 - 0.5 K/uL Final   Basophils Relative 09/09/2021 1  % Final   Basophils Absolute 09/09/2021 0.1  0.0 - 0.1 K/uL Final   Immature Granulocytes 09/09/2021 0  % Final   Abs Immature Granulocytes 09/09/2021 0.03  0.00 - 0.07 K/uL Final   Performed at [facility], [place] [address] [place], [place] [address]   Ammonia 09/09/2021 37 (H)  9 - 35 umol/L Final   Comment: HEMOLYSIS AT THIS LEVEL MAY AFFECT RESULT Performed at [facility], [place] [address], [place], [place] [address]    Opiates 09/09/2021 NONE DETECTED  NONE DETECTED Final   Cocaine 09/09/2021 NONE DETECTED  NONE DETECTED Final   Benzodiazepines 09/09/2021 NONE DETECTED  NONE DETECTED Final   Amphetamines 09/09/2021 NONE DETECTED  NONE DETECTED Final   Tetrahydrocannabinol 09/09/2021 NONE DETECTED  NONE DETECTED Final   Barbiturates 09/09/2021 NONE DETECTED  NONE DETECTED Final   Comment: (NOTE) DRUG SCREEN FOR MEDICAL PURPOSES ONLY.  IF CONFIRMATION IS NEEDED FOR ANY PURPOSE, NOTIFY LAB WITHIN 5 DAYS.  LOWEST DETECTABLE LIMITS FOR URINE DRUG SCREEN Drug Class                     Cutoff (ng/mL) Amphetamine and metabolites    1000 Barbiturate and metabolites    200 Benzodiazepine                 [id number] Tricyclics and metabolites     300 Opiates and metabolites        300 Cocaine and metabolites        300 THC                            50 Performed at [facility], [place] [address], [place], [place] [address]    Alcohol, Ethyl (B) 09/09/2021 <10  <10 mg/dL Final   Comment: (NOTE) Lowest detectable limit for serum alcohol is 10 mg/dL.  For medical purposes only. Performed at [facility], [place] [address], [place], [place] [address]    Lipase 09/09/2021 33  11 - 51 U/L Final   Performed at [facility] [address] [place], [place] [address]   Color, Urine 09/09/2021 COLORLESS (A)  YELLOW Final   APPearance  09/09/2021 CLEAR  CLEAR Final   Specific Gravity, Urine 09/09/2021 1.002 (L)  1.005 - 1.030 Final   pH 09/09/2021 6.0  5.0 - 8.0 Final   Glucose, UA 09/09/2021 NEGATIVE  NEGATIVE mg/dL Final   Hgb urine dipstick 09/09/2021 NEGATIVE  NEGATIVE Final   Bilirubin Urine 09/09/2021 NEGATIVE  NEGATIVE Final   Ketones, ur 09/09/2021 NEGATIVE  NEGATIVE mg/dL Final   Protein, ur 94/49/6759 NEGATIVE  NEGATIVE mg/dL Final   Nitrite 16/38/4665 NEGATIVE  NEGATIVE Final   Leukocytes,Ua 09/09/2021 NEGATIVE  NEGATIVE Final   Performed at [facility], [address] [place], [place] [address]   Specimen Description 09/09/2021 URINE, CLEAN CATCH   Final   Special Requests 09/09/2021 NONE   Final   Culture 09/09/2021  (A)   Final                   Value:<10,000 COLONIES/mL INSIGNIFICANT GROWTH Performed at [facility], [address] [place], [place] [address]    Report Status 09/09/2021 09/10/2021 FINAL   Final   D-Dimer, Quant 09/09/2021 0.92 (H)  0.00 - 0.50 ug/mL-FEU Final   Comment: (NOTE) At the manufacturer cut-off value of 0.5 g/mL FEU, this assay has a negative predictive value of 95-100%.This assay is intended for use in conjunction with a clinical pretest probability (PTP) assessment model to exclude pulmonary embolism (PE) and deep venous thrombosis (DVT) in outpatients suspected of PE or DVT. Results should be correlated with clinical presentation. Performed at [facility], [address] [place], [place] [address]    Troponin I (High Sensitivity) 09/09/2021 5  <18 ng/L Final   Comment: (NOTE) Elevated high sensitivity troponin I (hsTnI) values and significant  changes across serial measurements may suggest ACS but many other  chronic and acute conditions are known to elevate hsTnI results.  Refer to the "Links" section for chest pain algorithms and additional  guidance. Performed at [facility], [address], [place], [place] [address]    BP  09/10/2021 135/83  mmHg Final   S' Lateral 09/10/2021 2.30  cm Final   Area-P 1/2 09/10/2021 5.58  cm2 Final    Allergies: Penicillins and Penicillin g  PTA Medications: (Not in a hospital admission)   Medical Decision Making  Kaliope Stair was admitted to [facility] continuous assessment unit for Self-care deficit for medication administration, crisis management, and stabilization. 1. At risk for self care deficit  2. Noncompliance  3. Self-care deficit for medication administration  4. Schizophrenia, unspecified type (HCC)   Routine labs ordered, which include Lab Orders         Resp Panel by RT-PCR (Flu A&B, Covid) Anterior Nasal Swab         CBC with Differential/Platelet         Comprehensive metabolic panel         Magnesium         Ethanol         Lipid panel         TSH         Prolactin         Urinalysis, Routine w reflex microscopic Urine, Clean Catch         POCT Urine Drug Screen - (I-Screen)         POC SARS Coronavirus 2 Ag     Medication Management: Medications started  apixaban  5 mg Oral BID   ARIPiprazole  10 mg Oral Daily   clozapine  50 mg Oral Daily   cloZAPine  75 mg Oral QHS   diltiazem  240 mg Oral Daily   divalproex  500 mg Oral BID  fluticasone furoate-vilanterol  1 puff Inhalation Daily   [START ON 11/24/2021] insulin aspart  0-15 Units Subcutaneous TID WC   insulin glargine-yfgn  100 Units Subcutaneous Daily   latanoprost  1 drop Both Eyes QHS   [START ON 11/24/2021] metFORMIN  500 mg Oral BID WC   mupirocin ointment  1 Application Topical TID   nicotine  7 mg Transdermal Daily   [START ON 11/26/2021] Vitamin D3  50,000 Units Oral Q Thu     Prn Medications: acetaminophen, albuterol, alum & mag hydroxide-simeth, haloperidol, magnesium hydroxide  as needed  Will maintain continuous observation for safety. Social work consult ordered to work with DSS for appropriate placement of patient.  Patient doesn't  meet criteria for inpatient psychiatric treatment.  Admitted to continuous observation related to unable to care for herself and SW to file APS consult with related to obtaining guardianship and placement.    Recommendations  Based on my evaluation the patient appears to have an emergency medical condition for which I recommend the patient be transferred to the emergency department for further evaluation.  Catalia Massett, NP 11/23/21  6:31 PM

## 2021-11-23 NOTE — BH Assessment (Signed)
Comprehensive Clinical Assessment (CCA) Note  11/23/2021 Teresa Murillo [id number]  Disposition: Per [name], NP admission to continuous assessment is recommended for safety and to coordinate with SW and DSS for appropriate placement of patient.  APS report to be submitted for guardianship and placement, as patient is unable to care for herself.   The patient demonstrates the following risk factors for suicide: Chronic risk factors for suicide include: psychiatric disorder of Schizophrenia . Acute risk factors for suicide include: loss (financial, interpersonal, professional). Protective factors for this patient include: positive social support, positive therapeutic relationship, coping skills, and hope for the future. Considering these factors, the overall suicide risk at this point appears to be low. Patient is appropriate for outpatient follow up once safe d/c plan is established.   Patient is a 61 year old female/female with a history of Schizophrenia who presents voluntarily via [facility]PD to [facility] for assessment.  Patient presents with GPD and BHRT team after a couple of missing persons reports were filed by patient's group home, [facility].  Patient states she returned and the door was locked today.  She was given eviction notice from [facility] facility at the end of July 2023.  After she was hospitalized on 11/08/21 for Afib and RVR, hospital TOC SW called to inquire about patient returning to [facility].  Per Odette Horns, [facility] administrator,  they had agreed to take patient back on a temporary basis to allow time for her CM to find other placement for her.  Patient returned to [facility] on 9/18 and on 9/23, she informed staff she didn't want to stay.  She took money from her folder and left.  Missing persons report was filed.  Patient states she has been staying in the ED since she left on Saturday.    Per Odette Horns, [facility] administrator([phone]) patient had been missing and a neighbor called today to report she was sleeping on the facility porch.  Patient had mentioned returning to the facility and being locked out.  Per [name], patient has been off of her medications since Saturday.  [name] states patient cannot return to their facility at this point.  She was hoping the CM with [facility] [place]), would have found placement for patient by this point.  [name] would like to be contacted regarding disposition plan, as she has patient's medications and some of her belongings.    Chief Complaint: No chief complaint on file.  Visit Diagnosis: Schizophrenia, unspecified type Flowsheet Row ED from 11/23/2021 in [facility]  Thoughts that you would be better off dead, or of hurting yourself in some way Not at all  PHQ-9 Total Score 3      [place] ED from 11/23/2021 in [facility] Most recent reading at 11/23/2021  6:44 PM ED from 11/22/2021 in [facility] DEPT Most recent reading at 11/22/2021  8:28 PM ED from 11/22/2021 in [facility] DEPT Most recent reading at 11/22/2021  1:24 AM  C-SSRS RISK CATEGORY No Risk No Risk No Risk       CCA Screening, Triage and Referral (STR)  Patient Reported Information How did you hear about [place]? Legal System  What Is the Reason for Your Visit/Call Today? Patient presents with GPD and BHRT team voluntarily after a couple of missing persons reports were filed with patient's group home, [facility].  Patient states she returned and  the door was locked today.  She was given eviction notice from her current facility, however after she was hospitalized on 11/08/21 for Afib and RVR, she returned.  Per [facility] staff, [name], they had agreed to take patient back on a temporary basis to allow time for her CM to find other placement for her.   Patient is diagnosed with Schizophrenia and per [name], she has been off her medications for several days.  Patient returned to [facility] on 9/18 and on 9/23, she informed staff she didn't want to stay.  She took money from her folder and left.  Missing persons report was filed.  Patient states she has been staying in the ED since she left on Saturday.  How Long Has This Been Causing You Problems? 1 wk - 1 month  What Do You Feel Would Help You the Most Today? Social Support; Medication(s); Treatment for Depression or other mood problem   Have You Recently Had Any Thoughts About Hurting Yourself? No  Are You Planning to Commit Suicide/Harm Yourself At This time? No   Have you Recently Had Thoughts About [place]? No  Are You Planning to Harm Someone at This Time? No  Explanation: No data recorded  Have You Used Any Alcohol or Drugs in the Past 24 Hours? No  How Long Ago Did You Use Drugs or Alcohol? No data recorded What Did You Use and How Much? No data recorded  Do You Currently Have a Therapist/Psychiatrist? Yes  Name of Therapist/Psychiatrist: Dr. Franchot Mimes, [facility] for med management   Have You Been Recently Discharged From Any Office Practice or Programs? No  Explanation of Discharge From Practice/Program: No data recorded    CCA Screening Triage Referral Assessment Type of Contact: Face-to-Face  Telemedicine Service Delivery:   Is this Initial or Reassessment? Initial Assessment  Date Telepsych consult ordered in [facility]:  09/10/21  Time Telepsych consult ordered in [facility]:  No data recorded Location of Assessment: [facility] Assessment Services  Provider Location: [facility] Assessment Services   Collateral Involvement: Odette Horns [facility] staff, [phone], provided collateral   Does Patient Have a [place]? No  Legal Guardian Contact Information: No data recorded Copy of Legal Guardianship Form: No data recorded Legal  Guardian Notified of Arrival: No data recorded Legal Guardian Notified of Pending Discharge: Successfully notified  If Minor and Not Living with Parent(s), Who has Custody? NA  Is CPS involved or ever been involved? Never  Is APS involved or ever been involved? Never   Patient Determined To Be At Risk for Harm To Self or Others Based on Review of Patient Reported Information or Presenting Complaint? No  Method: No data recorded Availability of Means: No data recorded Intent: No data recorded Notification Required: No data recorded Additional Information for Danger to Others Potential: No data recorded Additional Comments for Danger to Others Potential: No data recorded Are There Guns or Other Weapons in Your Home? No data recorded Types of Guns/Weapons: No data recorded Are These Weapons Safely Secured?                            No data recorded Who Could Verify You Are Able To Have These Secured: No data recorded Do You Have any Outstanding Charges, Pending Court Dates, Parole/Probation? No data recorded Contacted To Inform of Risk of Harm To Self or Others: Other: Comment (NA)    Does Patient Present under  Involuntary Commitment? No  IVC Papers Initial File Date: No data recorded  [place] of Residence: [place]   Patient Currently Receiving the Following Services: Medication Management   Determination of Need: Urgent (48 hours)   Options For Referral: Outpatient Therapy; Medication Management; ALF/SNF     CCA Biopsychosocial Patient Reported Schizophrenia/Schizoaffective Diagnosis in Past: Yes   Strengths: Patient is engaged in outpt services with [facility], has some support   Mental Health Symptoms Depression:   None   Duration of Depressive symptoms:    Mania:   None   Anxiety:    None   Psychosis:   None   Duration of Psychotic symptoms:    Trauma:   N/A   Obsessions:   N/A   Compulsions:   None   Inattention:   N/A    Hyperactivity/Impulsivity:   N/A   Oppositional/Defiant Behaviors:   N/A   Emotional Irregularity:   None   Other Mood/Personality Symptoms:   None noted    Mental Status Exam Appearance and self-care  Stature:   Average   Weight:   Average weight   Clothing:   Age-appropriate   Grooming:   Normal   Cosmetic use:   None   Posture/gait:   Normal   Motor activity:   Not Remarkable   Sensorium  Attention:   Normal   Concentration:   Focuses on irrelevancies   Orientation:   Person; Place; Time   Recall/memory:   Normal   Affect and Mood  Affect:   Appropriate; Flat   Mood:   Euthymic   Relating  Eye contact:   Normal   Facial expression:   Responsive   Attitude toward examiner:   Cooperative   Thought and Language  Speech flow:  Clear and Coherent   Thought content:   Appropriate to Mood and Circumstances   Preoccupation:   None   Hallucinations:   None   Organization:  No data recorded  [employer] of Knowledge:   Fair   Intelligence:   Average   Abstraction:   Functional   Judgement:   Fair   Art therapist:   Adequate   Insight:   Fair   Decision Making:   Impulsive   Social Functioning  Social Maturity:   Impulsive   Social Judgement:   Impropriety   Stress  Stressors:   Housing   Coping Ability:   Overwhelmed   Skill Deficits:   Environmental health practitioner; Self-control   Supports:   Support needed     Religion: Religion/Spirituality Are You A Religious Person?: No  Leisure/Recreation: Leisure / Recreation Do You Have Hobbies?: No  Exercise/Diet: Exercise/Diet Do You Exercise?: No Have You Gained or Lost A Significant Amount of Weight in the Past Six Months?: No Do You Follow a Special Diet?: No Do You Have Any Trouble Sleeping?: No   CCA Employment/Education Employment/Work Situation:    Education:     CCA Family/Childhood History Family and Relationship  History: Family history Does patient have children?: No  Childhood History:  Childhood History By whom was/is the patient raised?: Mother (Not assessed) Did patient suffer any verbal/emotional/physical/sexual abuse as a child?: No Did patient suffer from severe childhood neglect?: No Has patient ever been sexually abused/assaulted/raped as an adolescent or adult?: No Was the patient ever a victim of a crime or a disaster?: No Witnessed domestic violence?: No Has patient been affected by domestic violence as an adult?: No  Child/Adolescent Assessment:  CCA Substance Use Alcohol/Drug Use: Alcohol / Drug Use Pain Medications: See MAR Prescriptions: See MAR Over the Counter: See MAR History of alcohol / drug use?: No history of alcohol / drug abuse Longest period of sobriety (when/how long): N/A                         ASAM's:  Six Dimensions of Multidimensional Assessment  Dimension 1:  Acute Intoxication and/or Withdrawal Potential:      Dimension 2:  Biomedical Conditions and Complications:      Dimension 3:  Emotional, Behavioral, or Cognitive Conditions and Complications:     Dimension 4:  Readiness to Change:     Dimension 5:  Relapse, Continued use, or Continued Problem Potential:     Dimension 6:  Recovery/Living Environment:     ASAM Severity Score:    ASAM Recommended Level of Treatment:     Substance use Disorder (SUD)    Recommendations for Services/Supports/Treatments:    Discharge Disposition:    DSM5 Diagnoses: Patient Active Problem List   Diagnosis Date Noted   Hypothyroidism 11/08/2021   Atrial flutter with rapid ventricular response ([place]) 11/08/2021   Acute encephalopathy 11/08/2021   AKI (acute kidney injury) ([place]) 11/08/2021   Aggressive behavior 10/21/2020   COPD (chronic obstructive pulmonary disease) ([place]) 06/02/2020   Broken ankle 06/02/2020   Glaucoma 06/06/2018   Microcytic anemia 06/05/2018   Schizophrenia ([place])  04/24/2015   Dyslipidemia 04/24/2015   Essential hypertension 12/15/2014   Type 2 diabetes mellitus without complications ([place]) 16/38/4665     Referrals to Alternative Service(s): Referred to Alternative Service(s):   Place:   Date:   Time:    Referred to Alternative Service(s):   Place:   Date:   Time:    Referred to Alternative Service(s):   Place:   Date:   Time:    Referred to Alternative Service(s):   Place:   Date:   Time:     Fransico Meadow, [facility]

## 2021-11-23 NOTE — ED Notes (Signed)
Patient took a shower and was given clean scrubs.

## 2021-11-23 NOTE — ED Notes (Signed)
Patient present to [facility] voluntary with GPD. Patient reports she was kicked out of her group home.  Denies SI/HI and AVH. Patient denies any physical complaints when asked. No acute distress noted. Support and encouragement provided. Routine safety checks conducted according to facility protocol. Encouraged patient to notify staff if thoughts of harm toward self or others arise. Patient verbalize understanding and agreement. Will continue to monitor for safety.

## 2021-11-23 NOTE — ED Notes (Signed)
Patient arrived on unit. Patient calm and cooperative. Patient taking shower. Patient is safe on unit with continued monitoring.

## 2021-11-23 NOTE — ED Notes (Signed)
Patient was given dinner.

## 2021-11-23 NOTE — ED Provider Notes (Signed)
Pharmacy wanted clarification of glargine-yfgn order (insulin) as per the order it calls for 100 units.  However prior record show patient getting 12 units of long acting insulin.  As recommended by pharmacy we will decrease order to 10 unit of glargine-yfgn  Pt also is taking Clozapine however no recent [place] lab as been draw,  will now order labs and restart in tomorrow pending lab results.

## 2021-11-23 NOTE — Progress Notes (Signed)
   11/23/21 1438  [place] (Walk-ins at [facility] only)  How Did You Hear About [place]? Legal System  What Is the Reason for Your Visit/Call Today? Patient presents with GPD and BHRT team voluntarily after a couple of missing persons reports were filed with patient's group home, [facility].  Patient states she returned and the door was locked today.  She was given eviction notice from her current facility, however after she was hospitalized on 11/08/21 for Afib and RVR, she returned.  Per [facility] staff, [name], they had agreed to take patient back on a temporary basis to allow time for her CM to find other placement for her.  Patient is diagnosed with Schizophrenia and per [name], she has been off her medications for several days.  Patient returned to [facility] on 9/18 and on 9/23, she informed staff she didn't want to stay.  She took money from her folder and left.  Missing persons report was filed.  Patient states she has been staying in the ED since she left on Saturday.  How Long Has This Been Causing You Problems? 1 wk - 1 month  Have You Recently Had Any Thoughts About Hurting Yourself? No  Are You Planning to Commit Suicide/Harm Yourself At This time? No  Have you Recently Had Thoughts About [place]? No  Are You Planning To Harm Someone At This Time? No  Are you currently experiencing any auditory, visual or other hallucinations? No  Have You Used Any Alcohol or Drugs in the Past 24 Hours? No  Do you have any current medical co-morbidities that require immediate attention? No  Clinician description of patient physical appearance/behavior: Patient is calm, cooperative AAOx4, somewhat disoriented to situation  What Do You Feel Would Help You the Most Today? Social Support;Medication(s);Treatment for Depression or other mood problem  If access to [facility] was not available, would you have sought care in the Emergency Department? No  Determination of Need  Urgent (48 hours)  Options For Referral Outpatient Therapy;Medication Management;ALF/SNF

## 2021-11-23 NOTE — ED Notes (Signed)
Pt is a group home pt, but does not have a legal guardian. Facility would like to be notified if pt shows back up. Pt is from [place] is the owner, phone number [phone].

## 2021-11-24 LAB — GLUCOSE, CAPILLARY
Glucose-Capillary: 137 mg/dL — ABNORMAL HIGH (ref 70–99)
Glucose-Capillary: 144 mg/dL — ABNORMAL HIGH (ref 70–99)
Glucose-Capillary: 87 mg/dL (ref 70–99)
Glucose-Capillary: 269 mg/dL — ABNORMAL HIGH (ref 70–99)

## 2021-11-24 MED ORDER — CLOZAPINE 25 MG PO TABS
50.0000 mg | ORAL_TABLET | Freq: Every day | ORAL | Status: DC
  Administered 2021-11-24 – 2022-01-23 (×61): 50 mg via ORAL
  Filled 2021-11-24 (×60): qty 2

## 2021-11-24 MED ORDER — INSULIN GLARGINE-YFGN 100 UNIT/ML ~~LOC~~ SOLN
12.0000 [IU] | Freq: Every day | SUBCUTANEOUS | Status: DC
  Administered 2021-11-24 – 2021-12-15 (×22): 12 [IU] via SUBCUTANEOUS

## 2021-11-24 MED ORDER — CLOZAPINE 25 MG PO TABS
75.0000 mg | ORAL_TABLET | Freq: Every day | ORAL | Status: DC
  Administered 2021-11-24 – 2022-01-22 (×60): 75 mg via ORAL
  Filled 2021-11-24 (×60): qty 3

## 2021-11-24 NOTE — ED Provider Notes (Signed)
Behavioral Health Progress Note  Date and Time: 11/24/2021 1:10 PM Name: Teresa Murillo MRN:  [id number]  Subjective: Patient seen and evaluated face-to-face by his provider, and chart reviewed. On evaluation, patient is alert and oriented x 3. Her thought process is linear and speech is clear and coherent. Her mood is dysphoric and affect is congruent. Today, patient denies suicidal ideations. She denies homicidal ideations. She denies auditory or visual hallucinations. There is no objective evidence that the patient is currently responding to internal or external stimuli. She describes her depression today as feeling sad because she does not have any money. She states that she was put out of the group, but she does not know why. She reports fair sleep. She reports a good appetite. She states that she does not have family support in the area and that her family is in [place]. She is compliant with current medication regimen. She denies medication side effects, no headaches, nausea, muscle spasms, or involuntary movements. She denies physical complaints at this time.  I reached out to Michael Crilly, CSW., via telephone to follow up on the patient's case regarding DSS guardianship and placement. He advised to follow up with Yvetta Grier, the CSW supervisor via chat in regards to follow-up with DSS. A secure chat was sent to Ms. Grier.   HPI: Per TTS note: Patient is a 60-year-old female/female with a history of Schizophrenia who presents voluntarily via [facility] for [facility].  Patient presents with GPD and BHRT team after a couple of missing persons reports were filed by patient's group home, [facility]. Patient states she returned, and the door was locked today.  She was given eviction notice from [facility] facility at the end of July 2023.  After she was hospitalized on 11/08/21 for Afib and RVR, hospital TOC SW called to inquire about patient returning to  [facility]. Per [name], [facility] administrator, they had agreed to take patient back on a temporary basis to allow time for her CM to find other placement for her. Patient returned to [facility] on 9/18 and on 9/23, she informed staff she didn't want to stay.  She took money from her folder and left.  Missing persons report was filed. Patient states she has been staying in the ED since she left on Saturday.     Per [name], [facility] administrator ([phone]) patient had been missing and a neighbor called today to report she was sleeping on the facility porch. Patient had mentioned returning to the facility and being locked out. Per [name], patient has been off of her medications since Saturday. [name] states patient cannot return to their facility at this point. She was hoping the CM with [facility], Paul Elliott ([phone]), would have found placement for patient by this point. [name] would like to be contacted regarding disposition plan, as she has patient's medications and some of her belongings.      Diagnosis:  Final diagnoses:  At risk for self care deficit  Noncompliance  Self-care deficit for medication administration  Schizophrenia, unspecified type (HCC)    Total Time spent with patient: 15 minutes  Past Psychiatric History: History of schizophrenia, aggressive behavior, and possible intellectual disability.  Past Medical History:  Past Medical History:  Diagnosis Date   Chronic obstructive pulmonary disease (COPD) (HCC)    Glaucoma    Hyperlipidemia    Hypertension    Iron deficiency    Schizoaffective disorder (HCC)      Type 2 diabetes mellitus (HCC)     Past Surgical History:  Procedure Laterality Date   TUBAL LIGATION     Family History:  Family History  Problem Relation Age of Onset   Breast cancer Maternal Grandmother    Family Psychiatric  History: No known history.  Social History:  Social History   Substance  and Sexual Activity  Alcohol Use Yes     Social History   Substance and Sexual Activity  Drug Use Not Currently    Social History   Socioeconomic History   Marital status: Divorced    Spouse name: Not on file   Number of children: Not on file   Years of education: Not on file   Highest education level: Not on file  Occupational History   Not on file  Tobacco Use   Smoking status: Every Day    Packs/day: 1.00    Types: Cigars, Cigarettes   Smokeless tobacco: Current  Vaping Use   Vaping Use: Never used  Substance and Sexual Activity   Alcohol use: Yes   Drug use: Not Currently   Sexual activity: Not Currently    Birth control/protection: Surgical  Other Topics Concern   Not on file  Social History Narrative   Not on file   Social Determinants of Health   Financial Resource Strain: Not on file  Food Insecurity: Not on file  Transportation Needs: Not on file  Physical Activity: Not on file  Stress: Not on file  Social Connections: Not on file   SDOH:  SDOH Screenings   Depression (PHQ2-9): Low Risk  (11/23/2021)  Tobacco Use: High Risk (11/23/2021)   Additional Social History:    Pain Medications: See MAR Prescriptions: See MAR Over the Counter: See MAR History of alcohol / drug use?: No history of alcohol / drug abuse Longest period of sobriety (when/how long): N/A     Current Medications:  Current Facility-Administered Medications  Medication Dose Route Frequency Provider Last Rate Last Admin   acetaminophen (TYLENOL) tablet 650 mg  650 mg Oral Q6H PRN Rankin, Shuvon B, NP       albuterol (VENTOLIN HFA) 108 (90 Base) MCG/ACT inhaler 2 puff  2 puff Inhalation Q6H PRN Rankin, Shuvon B, NP       alum & mag hydroxide-simeth (MAALOX/MYLANTA) 200-200-20 MG/5ML suspension 30 mL  30 mL Oral Q4H PRN Rankin, Shuvon B, NP       apixaban (ELIQUIS) tablet 5 mg  5 mg Oral BID Rankin, Shuvon B, NP   5 mg at 11/24/21 0943   cloZAPine (CLOZARIL) tablet 50 mg  50 mg Oral  Daily [name], NP   50 mg at 11/24/21 0943   cloZAPine (CLOZARIL) tablet 75 mg  75 mg Oral QHS [name], NP       diltiazem (CARDIZEM CD) 24 hr capsule 240 mg  240 mg Oral Daily Rankin, Shuvon B, NP   240 mg at 11/24/21 0815   divalproex (DEPAKOTE ER) 24 hr tablet 500 mg  500 mg Oral BID Rankin, Shuvon B, NP   500 mg at 11/24/21 0943   fluticasone furoate-vilanterol (BREO ELLIPTA) 200-25 MCG/ACT 1 puff  1 puff Inhalation Daily Rankin, Shuvon B, NP   1 puff at 11/24/21 0818   haloperidol (HALDOL) tablet 10 mg  10 mg Oral Q8H PRN Rankin, Shuvon B, NP       insulin aspart (novoLOG) injection 0-15 Units  0-15 Units Subcutaneous TID WC Rankin, Shuvon B, NP   2  Units at 11/24/21 0815   insulin glargine-yfgn (SEMGLEE) injection 12 Units  12 Units Subcutaneous Daily Rankin, Shuvon B, NP   12 Units at 11/24/21 1006   latanoprost (XALATAN) 0.005 % ophthalmic solution 1 drop  1 drop Both Eyes QHS Rankin, Shuvon B, NP       magnesium hydroxide (MILK OF MAGNESIA) suspension 30 mL  30 mL Oral Daily PRN Rankin, Shuvon B, NP       metFORMIN (GLUCOPHAGE) tablet 500 mg  500 mg Oral BID WC Rankin, Shuvon B, NP   500 mg at 11/24/21 0815   mupirocin ointment (BACTROBAN) 2 % 1 Application  1 Application Topical TID Rankin, Shuvon B, NP   1 Application at 11/24/21 0951   nicotine (NICODERM CQ - dosed in mg/24 hr) patch 7 mg  7 mg Transdermal Daily Rankin, Shuvon B, NP   7 mg at 11/24/21 0943   [START ON 11/26/2021] Vitamin D3 CAPS 50,000 Units  50,000 Units Oral Q Thu Rankin, Shuvon B, NP       Current Outpatient Medications  Medication Sig Dispense Refill   Accu-Chek Softclix Lancets lancets Use as directed up to four times daily 100 each 0   apixaban (ELIQUIS) 5 MG TABS tablet Take 1 tablet (5 mg total) by mouth 2 (two) times daily. 60 tablet 0   ARIPiprazole (ABILIFY) 10 MG tablet Take 1 tablet (10 mg total) by mouth daily. 30 tablet 0   Blood Glucose Monitoring Suppl (ACCU-CHEK GUIDE) w/Device KIT Use as  directed up to four times daily 1 kit 0   budesonide-formoterol (SYMBICORT) 160-4.5 MCG/ACT inhaler Inhale 2 puffs into the lungs in the morning and at bedtime.     Cholecalciferol (VITAMIN D3) 1.25 MG (50000 UT) CAPS Take 50,000 Units by mouth every Thursday.     cloZAPine (CLOZARIL) 25 MG tablet Take 3 tablets (75 mg total) by mouth at bedtime. 90 tablet 0   clozapine (CLOZARIL) 50 MG tablet Take 1 tablet (50 mg total) by mouth daily. 30 tablet 0   diltiazem (CARDIZEM CD) 240 MG 24 hr capsule Take 1 capsule (240 mg total) by mouth daily. (Patient not taking: Reported on 11/24/2021) 30 capsule 0   divalproex (DEPAKOTE ER) 500 MG 24 hr tablet Take 1 tablet (500 mg total) by mouth 2 (two) times daily. 60 tablet 0   glucose blood test strip Use as directed up to four times daily 50 each 0   haloperidol (HALDOL) 10 MG tablet Take 1 tablet (10 mg total) by mouth 3 (three) times daily as needed for agitation (and psychotic symptoms).     INGREZZA 40 MG capsule Take 1 capsule (40 mg total) by mouth in the morning. 30 capsule 0   insulin aspart (NOVOLOG) 100 UNIT/ML FlexPen Before each meal 3 times a day, 140-199 - 2 units, 200-250 - 4 units, 251-299 - 6 units,  300-349 - 8 units,  350 or above 10 units. Insulin PEN if approved, provide syringes and needles if needed.Please switch to any approved short acting Insulin if needed. 15 mL 0   insulin glargine (LANTUS) 100 UNIT/ML Solostar Pen Inject 12 Units into the skin daily. 15 mL 0   Insulin Pen Needle 32G X 4 MM MISC Use 4 times a day with insulin, 1 month supply. 100 each 0   latanoprost (XALATAN) 0.005 % ophthalmic solution Place 1 drop into both eyes at bedtime.     metFORMIN (GLUCOPHAGE) 500 MG tablet Take 500 mg by mouth 2 (two)   times daily with a meal.     nicotine (NICODERM CQ - DOSED IN MG/24 HR) 7 mg/24hr patch Place 1 patch (7 mg total) onto the skin daily. 28 patch 0   PROAIR HFA 108 (90 Base) MCG/ACT inhaler Inhale 2 puffs into the lungs every  6 (six) hours as needed for wheezing or shortness of breath.      Labs  Lab Results:  Admission on 11/23/2021  Component Date Value Ref Range Status   SARS Coronavirus 2 by RT PCR 11/23/2021 NEGATIVE  NEGATIVE Final   Comment: (NOTE) SARS-CoV-2 target nucleic acids are NOT DETECTED.  The SARS-CoV-2 RNA is generally detectable in upper respiratory specimens during the acute phase of infection. The lowest concentration of SARS-CoV-2 viral copies this assay can detect is 138 copies/mL. A negative result does not preclude SARS-Cov-2 infection and should not be used as the sole basis for treatment or other patient management decisions. A negative result may occur with  improper specimen collection/handling, submission of specimen other than nasopharyngeal swab, presence of viral mutation(s) within the areas targeted by this assay, and inadequate number of viral copies(<138 copies/mL). A negative result must be combined with clinical observations, patient history, and epidemiological information. The expected result is Negative.  Fact Sheet for Patients:  https://www.fda.gov/media/152166/download  Fact Sheet for Healthcare Providers:  https://www.fda.gov/media/152162/download  This test is no                          t yet approved or cleared by the [place] FDA and  has been authorized for detection and/or diagnosis of SARS-CoV-2 by FDA under an Emergency Use Authorization (EUA). This EUA will remain  in effect (meaning this test can be used) for the duration of the COVID-19 declaration under Section 564(b)(1) of the Act, 21 U.S.C.section 360bbb-3(b)(1), unless the authorization is terminated  or revoked sooner.       Influenza A by PCR 11/23/2021 NEGATIVE  NEGATIVE Final   Influenza B by PCR 11/23/2021 NEGATIVE  NEGATIVE Final   Comment: (NOTE) The Xpert Xpress SARS-CoV-2/FLU/RSV plus assay is intended as an aid in the diagnosis of influenza from Nasopharyngeal swab  specimens and should not be used as a sole basis for treatment. Nasal washings and aspirates are unacceptable for Xpert Xpress SARS-CoV-2/FLU/RSV testing.  Fact Sheet for Patients: https://www.fda.gov/media/152166/download  Fact Sheet for Healthcare Providers: https://www.fda.gov/media/152162/download  This test is not yet approved or cleared by the [place] FDA and has been authorized for detection and/or diagnosis of SARS-CoV-2 by FDA under an Emergency Use Authorization (EUA). This EUA will remain in effect (meaning this test can be used) for the duration of the COVID-19 declaration under Section 564(b)(1) of the Act, 21 U.S.C. section 360bbb-3(b)(1), unless the authorization is terminated or revoked.  Performed at [facility], [address] [place],  [address]    WBC 11/23/2021 7.4  4.0 - 10.5 K/uL Final   RBC 11/23/2021 4.25  3.87 - 5.11 MIL/uL Final   Hemoglobin 11/23/2021 11.1 (L)  12.0 - 15.0 g/dL Final   HCT 11/23/2021 34.6 (L)  36.0 - 46.0 % Final   MCV 11/23/2021 81.4  80.0 - 100.0 fL Final   MCH 11/23/2021 26.1  26.0 - 34.0 pg Final   MCHC 11/23/2021 32.1  30.0 - 36.0 g/dL Final   RDW 11/23/2021 17.7 (H)  11.5 - 15.5 % Final   Platelets 11/23/2021 365  150 - 400 K/uL Final   nRBC   11/23/2021 0.0  0.0 - 0.2 % Final   Neutrophils Relative % 11/23/2021 59  % Final   Neutro Abs 11/23/2021 4.3  1.7 - 7.7 K/uL Final   Lymphocytes Relative 11/23/2021 31  % Final   Lymphs Abs 11/23/2021 2.3  0.7 - 4.0 K/uL Final   Monocytes Relative 11/23/2021 7  % Final   Monocytes Absolute 11/23/2021 0.5  0.1 - 1.0 K/uL Final   Eosinophils Relative 11/23/2021 2  % Final   Eosinophils Absolute 11/23/2021 0.1  0.0 - 0.5 K/uL Final   Basophils Relative 11/23/2021 1  % Final   Basophils Absolute 11/23/2021 0.1  0.0 - 0.1 K/uL Final   Immature Granulocytes 11/23/2021 0  % Final   Abs Immature Granulocytes 11/23/2021 0.01  0.00 - 0.07 K/uL Final   Performed at [facility], [address] [place], [place] [address]   Sodium 11/23/2021 139  135 - 145 mmol/L Final   Potassium 11/23/2021 4.1  3.5 - 5.1 mmol/L Final   Chloride 11/23/2021 105  98 - 111 mmol/L Final   CO2 11/23/2021 26  22 - 32 mmol/L Final   Glucose, Bld 11/23/2021 101 (H)  70 - 99 mg/dL Final   Glucose reference range applies only to samples taken after fasting for at least 8 hours.   BUN 11/23/2021 5 (L)  6 - 20 mg/dL Final   Creatinine, Ser 11/23/2021 0.51  0.44 - 1.00 mg/dL Final   Calcium 11/23/2021 9.0  8.9 - 10.3 mg/dL Final   Total Protein 11/23/2021 6.7  6.5 - 8.1 g/dL Final   Albumin 11/23/2021 3.6  3.5 - 5.0 g/dL Final   AST 11/23/2021 24  15 - 41 U/L Final   ALT 11/23/2021 18  0 - 44 U/L Final   Alkaline Phosphatase 11/23/2021 62  38 - 126 U/L Final   Total Bilirubin 11/23/2021 0.4  0.3 - 1.2 mg/dL Final   GFR, Estimated 11/23/2021 >60  >60 mL/min Final   Comment: (NOTE) Calculated using the CKD-EPI Creatinine Equation (2021)    Anion gap 11/23/2021 8  5 - 15 Final   Performed at [facility], [address] [place], [place] [address]   Magnesium 11/23/2021 2.0  1.7 - 2.4 mg/dL Final   Performed at [facility], [address] [place], [place] [address]   Alcohol, Ethyl (B) 11/23/2021 <10  <10 mg/dL Final   Comment: (NOTE) Lowest detectable limit for serum alcohol is 10 mg/dL.  For medical purposes only. Performed at  Hospital Lab, [address] [place], [place] [address]    Color, Urine 11/23/2021 YELLOW  YELLOW Final   APPearance 11/23/2021 CLEAR  CLEAR Final   Specific Gravity, Urine 11/23/2021 1.025  1.005 - 1.030 Final   pH 11/23/2021 6.5  5.0 - 8.0 Final   Glucose, UA 11/23/2021 NEGATIVE  NEGATIVE mg/dL Final   Hgb urine dipstick 11/23/2021 NEGATIVE  NEGATIVE Final   Bilirubin Urine 11/23/2021 NEGATIVE  NEGATIVE Final   Ketones, ur 11/23/2021 NEGATIVE  NEGATIVE mg/dL Final   Protein, ur 11/23/2021 NEGATIVE  NEGATIVE mg/dL Final    Nitrite 11/23/2021 NEGATIVE  NEGATIVE Final   Leukocytes,Ua 11/23/2021 NEGATIVE  NEGATIVE Final   Microscopic not done on urines with negative protein, blood, leukocytes, nitrite, or glucose < 500 mg/dL.   RBC / HPF 11/23/2021 0-5  0 - 5 RBC/hpf Final   WBC, UA 11/23/2021 0-5  0 - 5 WBC/hpf Final   Bacteria, UA 11/23/2021   NONE SEEN  NONE SEEN Final   Squamous Epithelial / LPF 11/23/2021 0-5  0 - 5 Final   Mucus 11/23/2021 PRESENT   Final   Performed at [facility], [place] [address] [place], [place] [address]   POC Amphetamine UR 11/23/2021 None Detected  NONE DETECTED (Cut Off Level 1000 ng/mL) Final   POC Secobarbital (BAR) 11/23/2021 None Detected  NONE DETECTED (Cut Off Level 300 ng/mL) Final   POC Buprenorphine (BUP) 11/23/2021 None Detected  NONE DETECTED (Cut Off Level 10 ng/mL) Final   POC Oxazepam (BZO) 11/23/2021 None Detected  NONE DETECTED (Cut Off Level 300 ng/mL) Final   POC Cocaine UR 11/23/2021 None Detected  NONE DETECTED (Cut Off Level 300 ng/mL) Final   POC Methamphetamine UR 11/23/2021 None Detected  NONE DETECTED (Cut Off Level 1000 ng/mL) Final   POC Morphine 11/23/2021 None Detected  NONE DETECTED (Cut Off Level 300 ng/mL) Final   POC Methadone UR 11/23/2021 None Detected  NONE DETECTED (Cut Off Level 300 ng/mL) Final   POC Oxycodone UR 11/23/2021 None Detected  NONE DETECTED (Cut Off Level 100 ng/mL) Final   POC Marijuana UR 11/23/2021 None Detected  NONE DETECTED (Cut Off Level 50 ng/mL) Final   SARSCOV2ONAVIRUS 2 AG 11/23/2021 NEGATIVE  NEGATIVE Final   Comment: (NOTE) SARS-CoV-2 antigen NOT DETECTED.   Negative results are presumptive.  Negative results do not preclude SARS-CoV-2 infection and should not be used as the sole basis for treatment or other patient management decisions, including infection  control decisions, particularly in the presence of clinical signs and  symptoms consistent with COVID-19, or in those who have been in contact with the  virus.  Negative results must be combined with clinical observations, patient history, and epidemiological information. The expected result is Negative.  Fact Sheet for Patients: HandmadeRecipes.com.cy  Fact Sheet for Healthcare Providers: FuneralLife.at  This test is not yet approved or cleared by the [place] FDA and  has been authorized for detection and/or diagnosis of SARS-CoV-2 by FDA under an Emergency Use Authorization (EUA).  This EUA will remain in effect (meaning this test can be used) for the duration of  the COV                          ID-19 declaration under Section 564(b)(1) of the Act, 21 U.S.C. section 360bbb-3(b)(1), unless the authorization is terminated or revoked sooner.     Cholesterol 11/23/2021 171  0 - 200 mg/dL Final   Triglycerides 11/23/2021 125  <150 mg/dL Final   HDL 11/23/2021 65  >40 mg/dL Final   Total CHOL/HDL Ratio 11/23/2021 2.6  RATIO Final   VLDL 11/23/2021 25  0 - 40 mg/dL Final   LDL Cholesterol 11/23/2021 81  0 - 99 mg/dL Final   Comment:        Total Cholesterol/HDL:CHD Risk Coronary Heart Disease Risk Table                     Men   Women  1/2 Average Risk   3.4   3.3  Average Risk       5.0   4.4  2 X Average Risk   9.6   7.1  3 X Average Risk  23.4   11.0        Use the calculated Patient Ratio above and the CHD Risk Table to determine the patient's CHD Risk.  ATP III CLASSIFICATION (LDL):  <100     mg/dL   Optimal  100-129  mg/dL   Near or Above                    Optimal  130-159  mg/dL   Borderline  160-189  mg/dL   High  >190     mg/dL   Very High Performed at [facility] [address] [place], [place] [address]    TSH 11/23/2021 1.788  0.350 - 4.500 uIU/mL Final   Comment: Performed by a 3rd Generation assay with a functional sensitivity of <=0.01 uIU/mL. Performed at [facility], [place] [address] [place], [place] [address]    Glucose-Capillary  11/23/2021 137 (H)  70 - 99 mg/dL Final   Glucose reference range applies only to samples taken after fasting for at least 8 hours.   Glucose-Capillary 11/24/2021 144 (H)  70 - 99 mg/dL Final   Glucose reference range applies only to samples taken after fasting for at least 8 hours.   Glucose-Capillary 11/24/2021 87  70 - 99 mg/dL Final   Glucose reference range applies only to samples taken after fasting for at least 8 hours.  Admission on 11/22/2021, Discharged on 11/22/2021  Component Date Value Ref Range Status   Glucose-Capillary 11/22/2021 169 (H)  70 - 99 mg/dL Final   Glucose reference range applies only to samples taken after fasting for at least 8 hours.  No results displayed because visit has over 200 results.    Admission on 10/04/2021, Discharged on 10/22/2021  Component Date Value Ref Range Status   SARS Coronavirus 2 by RT PCR 10/04/2021 NEGATIVE  NEGATIVE Final   Comment: (NOTE) SARS-CoV-2 target nucleic acids are NOT DETECTED.  The SARS-CoV-2 RNA is generally detectable in upper respiratory specimens during the acute phase of infection. The lowest concentration of SARS-CoV-2 viral copies this assay can detect is 138 copies/mL. A negative result does not preclude SARS-Cov-2 infection and should not be used as the sole basis for treatment or other patient management decisions. A negative result may occur with  improper specimen collection/handling, submission of specimen other than nasopharyngeal swab, presence of viral mutation(s) within the areas targeted by this assay, and inadequate number of viral copies(<138 copies/mL). A negative result must be combined with clinical observations, patient history, and epidemiological information. The expected result is Negative.  Fact Sheet for Patients:  EntrepreneurPulse.com.au  Fact Sheet for Healthcare Providers:  IncredibleEmployment.be  This test is no                          t yet  approved or cleared by the [place] FDA and  has been authorized for detection and/or diagnosis of SARS-CoV-2 by FDA under an Emergency Use Authorization (EUA). This EUA will remain  in effect (meaning this test can be used) for the duration of the COVID-19 declaration under Section 564(b)(1) of the Act, 21 U.S.C.section 360bbb-3(b)(1), unless the authorization is terminated  or revoked sooner.       Influenza A by PCR 10/04/2021 NEGATIVE  NEGATIVE Final   Influenza B by PCR 10/04/2021 NEGATIVE  NEGATIVE Final   Comment: (NOTE) The Xpert Xpress SARS-CoV-2/FLU/RSV plus assay is intended as an aid in the diagnosis of influenza from Nasopharyngeal swab specimens and should not be used as a sole basis for treatment. Nasal washings and aspirates are unacceptable for Xpert Xpress SARS-CoV-2/FLU/RSV testing.  Fact Sheet  for Patients: EntrepreneurPulse.com.au  Fact Sheet for Healthcare Providers: IncredibleEmployment.be  This test is not yet approved or cleared by the [place] FDA and has been authorized for detection and/or diagnosis of SARS-CoV-2 by FDA under an Emergency Use Authorization (EUA). This EUA will remain in effect (meaning this test can be used) for the duration of the COVID-19 declaration under Section 564(b)(1) of the Act, 21 U.S.C. section 360bbb-3(b)(1), unless the authorization is terminated or revoked.  Performed at [facility], [place] [address] [place], [place] [address]    WBC 10/04/2021 8.4  4.0 - 10.5 K/uL Final   RBC 10/04/2021 4.42  3.87 - 5.11 MIL/uL Final   Hemoglobin 10/04/2021 11.6 (L)  12.0 - 15.0 g/dL Final   HCT 10/04/2021 35.7 (L)  36.0 - 46.0 % Final   MCV 10/04/2021 80.8  80.0 - 100.0 fL Final   MCH 10/04/2021 26.2  26.0 - 34.0 pg Final   MCHC 10/04/2021 32.5  30.0 - 36.0 g/dL Final   RDW 10/04/2021 16.0 (H)  11.5 - 15.5 % Final   Platelets 10/04/2021 372  150 - 400 K/uL Final   nRBC  10/04/2021 0.0  0.0 - 0.2 % Final   Neutrophils Relative % 10/04/2021 68  % Final   Neutro Abs 10/04/2021 5.7  1.7 - 7.7 K/uL Final   Lymphocytes Relative 10/04/2021 22  % Final   Lymphs Abs 10/04/2021 1.8  0.7 - 4.0 K/uL Final   Monocytes Relative 10/04/2021 8  % Final   Monocytes Absolute 10/04/2021 0.7  0.1 - 1.0 K/uL Final   Eosinophils Relative 10/04/2021 1  % Final   Eosinophils Absolute 10/04/2021 0.1  0.0 - 0.5 K/uL Final   Basophils Relative 10/04/2021 1  % Final   Basophils Absolute 10/04/2021 0.1  0.0 - 0.1 K/uL Final   Immature Granulocytes 10/04/2021 0  % Final   Abs Immature Granulocytes 10/04/2021 0.03  0.00 - 0.07 K/uL Final   Performed at [facility], [place] [address], [place], [place] [address]   Sodium 10/04/2021 136  135 - 145 mmol/L Final   Potassium 10/04/2021 4.2  3.5 - 5.1 mmol/L Final   Chloride 10/04/2021 104  98 - 111 mmol/L Final   CO2 10/04/2021 25  22 - 32 mmol/L Final   Glucose, Bld 10/04/2021 100 (H)  70 - 99 mg/dL Final   Glucose reference range applies only to samples taken after fasting for at least 8 hours.   BUN 10/04/2021 9  6 - 20 mg/dL Final   Creatinine, Ser 10/04/2021 0.52  0.44 - 1.00 mg/dL Final   Calcium 10/04/2021 9.0  8.9 - 10.3 mg/dL Final   Total Protein 10/04/2021 7.0  6.5 - 8.1 g/dL Final   Albumin 10/04/2021 3.2 (L)  3.5 - 5.0 g/dL Final   AST 10/04/2021 13 (L)  15 - 41 U/L Final   ALT 10/04/2021 10  0 - 44 U/L Final   Alkaline Phosphatase 10/04/2021 61  38 - 126 U/L Final   Total Bilirubin 10/04/2021 0.3  0.3 - 1.2 mg/dL Final   GFR, Estimated 10/04/2021 >60  >60 mL/min Final   Comment: (NOTE) Calculated using the CKD-EPI Creatinine Equation (2021)    Anion gap 10/04/2021 7  5 - 15 Final   Performed at [facility] [address], [place], [place] [address]   Hgb A1c MFr Bld 10/04/2021 7.0 (H)  4.8 - 5.6 % Final   Comment: (NOTE) Pre diabetes:  5.7%-6.4%  Diabetes:              >6.4%  Glycemic control  for   <7.0% adults with diabetes    Mean Plasma Glucose 10/04/2021 154.2  mg/dL Final   Performed at [facility], [address] [place], [place] [address]   Cholesterol 10/04/2021 178  0 - 200 mg/dL Final   Triglycerides 10/04/2021 155 (H)  <150 mg/dL Final   HDL 10/04/2021 52  >40 mg/dL Final   Total CHOL/HDL Ratio 10/04/2021 3.4  RATIO Final   VLDL 10/04/2021 31  0 - 40 mg/dL Final   LDL Cholesterol 10/04/2021 95  0 - 99 mg/dL Final   Comment:        Total Cholesterol/HDL:CHD Risk Coronary Heart Disease Risk Table                     Men   Women  1/2 Average Risk   3.4   3.3  Average Risk       5.0   4.4  2 X Average Risk   9.6   7.1  3 X Average Risk  23.4   11.0        Use the calculated Patient Ratio above and the CHD Risk Table to determine the patient's CHD Risk.        ATP III CLASSIFICATION (LDL):  <100     mg/dL   Optimal  100-129  mg/dL   Near or Above                    Optimal  130-159  mg/dL   Borderline  160-189  mg/dL   High  >190     mg/dL   Very High Performed at [facility], [address] [place], [place] [address]    POC Amphetamine UR 10/04/2021 None Detected  NONE DETECTED (Cut Off Level 1000 ng/mL) Final   POC Secobarbital (BAR) 10/04/2021 None Detected  NONE DETECTED (Cut Off Level 300 ng/mL) Final   POC Buprenorphine (BUP) 10/04/2021 None Detected  NONE DETECTED (Cut Off Level 10 ng/mL) Final   POC Oxazepam (BZO) 10/04/2021 None Detected  NONE DETECTED (Cut Off Level 300 ng/mL) Final   POC Cocaine UR 10/04/2021 None Detected  NONE DETECTED (Cut Off Level 300 ng/mL) Final   POC Methamphetamine UR 10/04/2021 None Detected  NONE DETECTED (Cut Off Level 1000 ng/mL) Final   POC Morphine 10/04/2021 None Detected  NONE DETECTED (Cut Off Level 300 ng/mL) Final   POC Methadone UR 10/04/2021 None Detected  NONE DETECTED (Cut Off Level 300 ng/mL) Final   POC Oxycodone UR 10/04/2021 Positive (A)  NONE DETECTED (Cut Off Level 100 ng/mL) Final    POC Marijuana UR 10/04/2021 None Detected  NONE DETECTED (Cut Off Level 50 ng/mL) Final   SARSCOV2ONAVIRUS 2 AG 10/04/2021 NEGATIVE  NEGATIVE Final   Comment: (NOTE) SARS-CoV-2 antigen NOT DETECTED.   Negative results are presumptive.  Negative results do not preclude SARS-CoV-2 infection and should not be used as the sole basis for treatment or other patient management decisions, including infection  control decisions, particularly in the presence of clinical signs and  symptoms consistent with COVID-19, or in those who have been in contact with the virus.  Negative results must be combined with clinical observations, patient history, and epidemiological information. The expected result is Negative.  Fact Sheet for Patients: https://www.fda.gov/media/141569/download  Fact Sheet for Healthcare Providers: https://www.fda.gov/media/141568/download  This test is not   yet approved or cleared by the [place] and  has been authorized for detection and/or diagnosis of SARS-CoV-2 by FDA under an Emergency Use Authorization (EUA).  This EUA will remain in effect (meaning this test can be used) for the duration of  the COV                          ID-19 declaration under Section 564(b)(1) of the Act, 21 U.S.C. section 360bbb-3(b)(1), unless the authorization is terminated or revoked sooner.     Valproic Acid Lvl 10/04/2021 51  50.0 - 100.0 ug/mL Final   Performed at [facility] [address] [place], [place] [address]   Valproic Acid Lvl 10/08/2021 57  50.0 - 100.0 ug/mL Final   Performed at [facility] [address] [place], [place] [address]   Glucose-Capillary 10/09/2021 123 (H)  70 - 99 mg/dL Final   Glucose reference range applies only to samples taken after fasting for at least 8 hours.  Admission on 09/09/2021, Discharged on 09/10/2021  Component Date Value Ref Range Status   Sodium 09/09/2021 134 (L)  135 - 145 mmol/L Final   Potassium 09/09/2021 4.3   3.5 - 5.1 mmol/L Final   Chloride 09/09/2021 99  98 - 111 mmol/L Final   CO2 09/09/2021 25  22 - 32 mmol/L Final   Glucose, Bld 09/09/2021 107 (H)  70 - 99 mg/dL Final   Glucose reference range applies only to samples taken after fasting for at least 8 hours.   BUN 09/09/2021 12  6 - 20 mg/dL Final   Creatinine, Ser 09/09/2021 0.74  0.44 - 1.00 mg/dL Final   Calcium 09/09/2021 9.0  8.9 - 10.3 mg/dL Final   Total Protein 09/09/2021 7.4  6.5 - 8.1 g/dL Final   Albumin 09/09/2021 3.1 (L)  3.5 - 5.0 g/dL Final   AST 09/09/2021 13 (L)  15 - 41 U/L Final   ALT 09/09/2021 13  0 - 44 U/L Final   Alkaline Phosphatase 09/09/2021 66  38 - 126 U/L Final   Total Bilirubin 09/09/2021 0.2 (L)  0.3 - 1.2 mg/dL Final   GFR, Estimated 09/09/2021 >60  >60 mL/min Final   Comment: (NOTE) Calculated using the CKD-EPI Creatinine Equation (2021)    Anion gap 09/09/2021 10  5 - 15 Final   Performed at [facility], [place] [address], [place], [place] [address]   WBC 09/09/2021 8.5  4.0 - 10.5 K/uL Final   RBC 09/09/2021 4.53  3.87 - 5.11 MIL/uL Final   Hemoglobin 09/09/2021 11.8 (L)  12.0 - 15.0 g/dL Final   HCT 09/09/2021 38.0  36.0 - 46.0 % Final   MCV 09/09/2021 83.9  80.0 - 100.0 fL Final   MCH 09/09/2021 26.0  26.0 - 34.0 pg Final   MCHC 09/09/2021 31.1  30.0 - 36.0 g/dL Final   RDW 09/09/2021 17.8 (H)  11.5 - 15.5 % Final   Platelets 09/09/2021 442 (H)  150 - 400 K/uL Final   nRBC 09/09/2021 0.0  0.0 - 0.2 % Final   Neutrophils Relative % 09/09/2021 70  % Final   Neutro Abs 09/09/2021 5.9  1.7 - 7.7 K/uL Final   Lymphocytes Relative 09/09/2021 20  % Final   Lymphs Abs 09/09/2021 1.7  0.7 - 4.0 K/uL Final   Monocytes Relative 09/09/2021 8  % Final   Monocytes Absolute 09/09/2021 0.7  0.1 - 1.0 K/uL  Final   Eosinophils Relative 09/09/2021 1  % Final   Eosinophils Absolute 09/09/2021 0.1  0.0 - 0.5 K/uL Final   Basophils Relative 09/09/2021 1  % Final   Basophils Absolute 09/09/2021 0.1  0.0 -  0.1 K/uL Final   Immature Granulocytes 09/09/2021 0  % Final   Abs Immature Granulocytes 09/09/2021 0.03  0.00 - 0.07 K/uL Final   Performed at [facility], [address] [place], [place] [address]   Ammonia 09/09/2021 37 (H)  9 - 35 umol/L Final   Comment: HEMOLYSIS AT THIS LEVEL MAY AFFECT RESULT Performed at [facility], [address] [place], [place] [address]    Opiates 09/09/2021 NONE DETECTED  NONE DETECTED Final   Cocaine 09/09/2021 NONE DETECTED  NONE DETECTED Final   Benzodiazepines 09/09/2021 NONE DETECTED  NONE DETECTED Final   Amphetamines 09/09/2021 NONE DETECTED  NONE DETECTED Final   Tetrahydrocannabinol 09/09/2021 NONE DETECTED  NONE DETECTED Final   Barbiturates 09/09/2021 NONE DETECTED  NONE DETECTED Final   Comment: (NOTE) DRUG SCREEN FOR MEDICAL PURPOSES ONLY.  IF CONFIRMATION IS NEEDED FOR ANY PURPOSE, NOTIFY LAB WITHIN 5 DAYS.  LOWEST DETECTABLE LIMITS FOR URINE DRUG SCREEN Drug Class                     Cutoff (ng/mL) Amphetamine and metabolites    1000 Barbiturate and metabolites    200 Benzodiazepine                 200 Tricyclics and metabolites     300 Opiates and metabolites        300 Cocaine and metabolites        300 THC                            50 Performed at [facility], [address] [place], [place] [address]    Alcohol, Ethyl (B) 09/09/2021 <10  <10 mg/dL Final   Comment: (NOTE) Lowest detectable limit for serum alcohol is 10 mg/dL.  For medical purposes only. Performed at [facility], [address] [place], [place] [address]    Lipase 09/09/2021 33  11 - 51 U/L Final   Performed at [facility], [address] [place], [place] [address]   Color, Urine 09/09/2021 COLORLESS (A)  YELLOW Final   APPearance 09/09/2021 CLEAR  CLEAR Final   Specific Gravity, Urine 09/09/2021 1.002 (L)  1.005 - 1.030 Final   pH 09/09/2021 6.0  5.0 - 8.0 Final   Glucose, UA 09/09/2021 NEGATIVE  NEGATIVE mg/dL  Final   Hgb urine dipstick 09/09/2021 NEGATIVE  NEGATIVE Final   Bilirubin Urine 09/09/2021 NEGATIVE  NEGATIVE Final   Ketones, ur 09/09/2021 NEGATIVE  NEGATIVE mg/dL Final   Protein, ur 09/09/2021 NEGATIVE  NEGATIVE mg/dL Final   Nitrite 09/09/2021 NEGATIVE  NEGATIVE Final   Leukocytes,Ua 09/09/2021 NEGATIVE  NEGATIVE Final   Performed at [facility], [address] , [place] [address]   Specimen Description 09/09/2021 URINE, CLEAN CATCH   Final   Special Requests 09/09/2021 NONE   Final   Culture 09/09/2021  (A)   Final                   Value:<10,000 COLONIES/mL INSIGNIFICANT GROWTH Performed at  [facility]ospital [facility], [address] , [place] [address]    Report Status 09/09/2021 09/10/2021 FINAL     Final   D-Dimer, Quant 09/09/2021 0.92 (H)  0.00 - 0.50 ug/mL-FEU Final   Comment: (NOTE) At the manufacturer cut-off value of 0.5 g/mL FEU, this assay has a negative predictive value of 95-100%.This assay is intended for use in conjunction with a clinical pretest probability (PTP) assessment model to exclude pulmonary embolism (PE) and deep venous thrombosis (DVT) in outpatients suspected of PE or DVT. Results should be correlated with clinical presentation. Performed at [facility], [address] [place], [place] [address]    Troponin I (High Sensitivity) 09/09/2021 5  <18 ng/L Final   Comment: (NOTE) Elevated high sensitivity troponin I (hsTnI) values and significant  changes across serial measurements may suggest ACS but many other  chronic and acute conditions are known to elevate hsTnI results.  Refer to the "Links" section for chest pain algorithms and additional  guidance. Performed at [facility], [address] [place], [place] [address]    BP 09/10/2021 135/83  mmHg Final   S' Lateral 09/10/2021 2.30  cm Final   Area-P 1/2 09/10/2021 5.58  cm2 Final    Blood Alcohol level:  Lab Results  Component Value Date   ETH <10  11/23/2021   ETH <10 11/08/2021    Metabolic Disorder Labs: Lab Results  Component Value Date   HGBA1C 7.0 (H) 10/04/2021   MPG 154.2 10/04/2021   No results found for: "PROLACTIN" Lab Results  Component Value Date   CHOL 171 11/23/2021   TRIG 125 11/23/2021   HDL 65 11/23/2021   CHOLHDL 2.6 11/23/2021   VLDL 25 11/23/2021   LDLCALC 81 11/23/2021   LDLCALC 95 10/04/2021    Therapeutic Lab Levels: No results found for: "LITHIUM" Lab Results  Component Value Date   VALPROATE 45 (L) 11/09/2021   VALPROATE 57 10/08/2021   No results found for: "CBMZ"  Physical Findings   PHQ2-9    Flowsheet Row ED from 11/23/2021 in [facility]  PHQ-2 Total Score 2  PHQ-9 Total Score 3      Flowsheet Row ED from 11/23/2021 in [facility] Most recent reading at 11/23/2021  6:44 PM ED from 11/22/2021 in [facility] Most recent reading at 11/22/2021  8:28 PM ED from 11/22/2021 in [facility] Most recent reading at 11/22/2021  1:24 AM  C-SSRS RISK CATEGORY No Risk No Risk No Risk        Musculoskeletal  Strength & Muscle Tone: within normal limits Gait & Station: normal Patient leans: N/A  Psychiatric Specialty Exam  Presentation  General Appearance: Appropriate for Environment  Eye Contact:Fair  Speech:Clear and Coherent  Speech Volume:Normal  Handedness:Right   Mood and Affect  Mood:Dysphoric  Affect:Congruent   Thought Process  Thought Processes:Coherent  Descriptions of Associations:Intact  Orientation:Full (Time, Place and Person)  Thought Content:Logical  Diagnosis of Schizophrenia or Schizoaffective disorder in past: Yes  Duration of Psychotic Symptoms: No data recorded  Hallucinations:Hallucinations: None  Ideas of Reference:None  Suicidal Thoughts:Suicidal Thoughts: No  Homicidal Thoughts:Homicidal Thoughts: No   Sensorium   Memory:Immediate Fair; Recent Fair; Remote Fair  Judgment:Poor  Insight:Lacking   Executive Functions  Concentration:Fair  Attention Span:Fair  Recall:Fair  Fund of Knowledge:Poor  Language:Fair   Psychomotor Activity  Psychomotor Activity:Psychomotor Activity: Normal   Assets  Assets:Communication Skills; Desire for Improvement; Financial Resources/Insurance   Sleep  Sleep:Sleep: Fair Number of Hours of Sleep: 8   Nutritional Assessment (For OBS and   FBC admissions only) Has the patient had a weight loss or gain of 10 pounds or more in the last 3 months?: No Has the patient had a decrease in food intake/or appetite?: No Does the patient have dental problems?: No Does the patient have eating habits or behaviors that may be indicators of an eating disorder including binging or inducing vomiting?: No Has the patient recently lost weight without trying?: 0 Has the patient been eating poorly because of a decreased appetite?: 0 Malnutrition Screening Tool Score: 0    Physical Exam  Physical Exam HENT:     Head: Normocephalic.     Nose: Nose normal.  Eyes:     Conjunctiva/sclera: Conjunctivae normal.  Cardiovascular:     Rate and Rhythm: Normal rate.  Pulmonary:     Effort: Pulmonary effort is normal.  Musculoskeletal:        General: Normal range of motion.     Cervical back: Normal range of motion.  Neurological:     Mental Status: She is alert and oriented to person, place, and time.    Review of Systems  Constitutional: Negative.   HENT: Negative.    Eyes: Negative.   Respiratory: Negative.    Cardiovascular: Negative.   Gastrointestinal: Negative.   Genitourinary: Negative.   Musculoskeletal: Negative.   Skin: Negative.   Neurological: Negative.   Endo/Heme/Allergies: Negative.    Blood pressure 113/68, pulse 85, temperature 98.8 F (37.1 C), temperature source Oral, resp. rate 20, SpO2 100 %. There is no height or weight on file to calculate  BMI.  Treatment Plan Summary: Patient admitted to the [facility] behavioral health continuous assessment unit while the TOC/social work team coordinate care with the patient's DSS coordinator to determine appropriate placement and/or guardianship for a safe discharge.  No medication changes today. Continue with current medication regimen.    apixaban  5 mg Oral BID   clozapine  50 mg Oral Daily   cloZAPine  75 mg Oral QHS   diltiazem  240 mg Oral Daily   divalproex  500 mg Oral BID   fluticasone furoate-vilanterol  1 puff Inhalation Daily   insulin aspart  0-15 Units Subcutaneous TID WC   insulin glargine-yfgn  12 Units Subcutaneous Daily   latanoprost  1 drop Both Eyes QHS   metFORMIN  500 mg Oral BID WC   mupirocin ointment  1 Application Topical TID   nicotine  7 mg Transdermal Daily   [START ON 11/26/2021] Vitamin D3  50,000 Units Oral Q Thu    Labs reviewed, CBG 87, WBC is unremarkable. Patient is Afebrile. UA neg for UTI. EKG QTC is 423.  Javious Hallisey L, NP 11/24/2021 1:10 PM

## 2021-11-24 NOTE — ED Notes (Signed)
Pt laying in bed calm and cooperative. No c/o pain or distress.will continue to monitor for safety

## 2021-11-24 NOTE — ED Notes (Signed)
Pt sleeping@this time. Breathing even and unlabored will continue to monitor for safety 

## 2021-11-24 NOTE — ED Notes (Signed)
Pt has chips for a snack

## 2021-11-24 NOTE — ED Notes (Signed)
Pt accepted morning meds, including ISS and breakfast w/o difficulty. No s&s of distress. Denied SI/HI/AVH. Stated to this nurse regarding the group home, "She said I was acting out and that she wanted me gone". Questioned Pt what was she doing, pt replied "I do not know" as she was shaking her head from side to side. Gave Pt a pair of gripper socks and encouraged her to wear them and not walk around bare foot on the floor. Pt agreed w/staff and put the gripper socks on. Safety maintained and will continue to monitor.

## 2021-11-24 NOTE — Care Management (Signed)
Care Management - APS Report   Writer received a follow up phone call from the Department of Social Services and completed an APS report.  Writer spoke to [place] to complete the report at 6:46pm  The following concerns were documented in the chart and were reported to the [place] worker, Thayer Jew.    Per documentation in epic, the patient left ([facility]) and the facility reported the patient as a missing person.    Per documentation in the chart, when the patient attempted to return the [facility] locked the patient out of the home out.     Per documentation in the chart, [name] ([facility]) states that the patient cannot return to their facility at this point.

## 2021-11-24 NOTE — Care Management (Signed)
Care Management   Writer spoke to [facility] with [facility] [phone]).  Per Beau Fanny the patient has a Transition of [employer] Beckie Salts [phone]).  Writer left a HIPPA compliant voice mail message.  Per [place] patient also has a Care Manager Maryln Gottron [phone]).  Writer left a HIPPA compliant voice mail message.    Writer is awaiting a call back from APS to make a formal report to APS due to the patient's group home refusing to take the patient back and the patient needing a legal guardian.

## 2021-11-24 NOTE — ED Notes (Signed)
Pt laying in her bed calm and cooperative. Alert and orient x 4. Pt just ate a snack. Will continue to monitor for safety

## 2021-11-25 ENCOUNTER — Telehealth (HOSPITAL_COMMUNITY): Payer: Self-pay | Admitting: Physician Assistant

## 2021-11-25 LAB — PROLACTIN: Prolactin: 3.8 ng/mL — ABNORMAL LOW (ref 4.8–23.3)

## 2021-11-25 LAB — GLUCOSE, CAPILLARY
Glucose-Capillary: 112 mg/dL — ABNORMAL HIGH (ref 70–99)
Glucose-Capillary: 136 mg/dL — ABNORMAL HIGH (ref 70–99)
Glucose-Capillary: 187 mg/dL — ABNORMAL HIGH (ref 70–99)

## 2021-11-25 NOTE — ED Notes (Signed)
Pt sleeping@this time. Breathing even and unlabored. Will continue to monitor for safety 

## 2021-11-25 NOTE — ED Notes (Signed)
Pt given towels and washcloth. She is currently taking a shower.

## 2021-11-25 NOTE — ED Provider Notes (Signed)
Behavioral Health Progress Note  Date and Time: 11/25/2021 9:26 AM Name: Teresa Murillo MRN:  [id number]  Subjective: Patient states "I was sent here from [facility], I have nowhere to go."  [place] is reassessed, face-to-face, by nurse practitioner.  She is seated in observation area upon my approach, no apparent distress.  She is alert and oriented, pleasant and cooperative during assessment.  She presents with euthymic mood, congruent affect. Patient states "the sheriff picked me up from [place], when he tried to take me back to the group home they locked the door would not let me in."  Patient reports she was banned from a local gas station because "they said I was bothering people but I was not."  She has been diagnosed with schizoaffective disorder, aggressive behavior and IDD.  She is unable to articulate medications.  She reports she is compliant with home medications as administered by group home staff.  She does not recall previous inpatient psychiatric hospitalizations.  No family mental health history reported.  [place] denies suicidal and homicidal ideations.  She easily contracts verbally for safety with this Probation officer.  She would like to return to group home and is able to verify that she would be safe and cooperative upon return to group home.  She denies auditory and visual hallucinations.  There is no evidence of delusional thought content and no indication that patient is responding to internal stimuli.  Nadia most recently resided at [facility] group home in [place].  She denies access to weapons. She is not employed.  She denies alcohol and substance use.  She endorses average sleep and appetite.  Patient offered support and encouragement.  Patient updated regarding treatment plan to include disposition social work team's effort to have patient placed in previous group home or new group home.  Patient verbalizes understanding, agrees with plan.  She gives verbal consent to  speak with care coordination team as well as group home staff.  Disposition social work team has reached out to [place] for adult protective services report, this report has been completed. Patient would likely benefit legal guardian.   Disposition social work team is currently working alongside patient's care coordination team as well as Mining engineer regarding safe disposition.  Diagnosis:  Final diagnoses:  At risk for self care deficit  Noncompliance  Self-care deficit for medication administration  Schizophrenia, unspecified type ([place])    Total Time spent with patient: 30 minutes  Past Psychiatric History: IDD, schizoaffective disorder, aggressive behavior Past Medical History:  Past Medical History:  Diagnosis Date   Chronic obstructive pulmonary disease (COPD) ([place])    Glaucoma    Hyperlipidemia    Hypertension    Iron deficiency    Schizoaffective disorder ([place])    Type 2 diabetes mellitus ([place])     Past Surgical History:  Procedure Laterality Date   TUBAL LIGATION     Family History:  Family History  Problem Relation Age of Onset   Breast cancer Maternal Grandmother    Family Psychiatric  History: None reported Social History:  Social History   Substance and Sexual Activity  Alcohol Use Yes     Social History   Substance and Sexual Activity  Drug Use Not Currently    Social History   Socioeconomic History   Marital status: Divorced    Spouse name: Not on file   Number of children: Not on file   Years of education: Not on file  Highest education level: Not on file  Occupational History   Not on file  Tobacco Use   Smoking status: Every Day    Packs/day: 1.00    Types: Cigars, Cigarettes   Smokeless tobacco: Current  Vaping Use   Vaping Use: Never used  Substance and Sexual Activity   Alcohol use: Yes   Drug use: Not Currently   Sexual activity: Not Currently    Birth  control/protection: Surgical  Other Topics Concern   Not on file  Social History Narrative   Not on file   Social Determinants of Health   Financial Resource Strain: Not on file  Food Insecurity: Not on file  Transportation Needs: Not on file  Physical Activity: Not on file  Stress: Not on file  Social Connections: Not on file   SDOH:  SDOH Screenings   Depression (PHQ2-9): Low Risk  (11/23/2021)  Tobacco Use: High Risk (11/23/2021)   Additional Social History:    Pain Medications: See MAR Prescriptions: See MAR Over the Counter: See MAR History of alcohol / drug use?: No history of alcohol / drug abuse Longest period of sobriety (when/how long): N/A                    Sleep: Good  Appetite:  Good  Current Medications:  Current Facility-Administered Medications  Medication Dose Route Frequency Provider Last Rate Last Admin   acetaminophen (TYLENOL) tablet 650 mg  650 mg Oral Q6H PRN Rankin, Shuvon B, NP       albuterol (VENTOLIN HFA) 108 (90 Base) MCG/ACT inhaler 2 puff  2 puff Inhalation Q6H PRN Rankin, Shuvon B, NP       alum & mag hydroxide-simeth (MAALOX/MYLANTA) 200-200-20 MG/5ML suspension 30 mL  30 mL Oral Q4H PRN Rankin, Shuvon B, NP       apixaban (ELIQUIS) tablet 5 mg  5 mg Oral BID Rankin, Shuvon B, NP   5 mg at 11/24/21 2126   cloZAPine (CLOZARIL) tablet 50 mg  50 mg Oral Daily [name], NP   50 mg at 11/24/21 0943   cloZAPine (CLOZARIL) tablet 75 mg  75 mg Oral QHS [name], NP   75 mg at 11/24/21 2122   diltiazem (CARDIZEM CD) 24 hr capsule 240 mg  240 mg Oral Daily Rankin, Shuvon B, NP   240 mg at 11/24/21 0815   divalproex (DEPAKOTE ER) 24 hr tablet 500 mg  500 mg Oral BID Rankin, Shuvon B, NP   500 mg at 11/24/21 2122   fluticasone furoate-vilanterol (BREO ELLIPTA) 200-25 MCG/ACT 1 puff  1 puff Inhalation Daily Rankin, Shuvon B, NP   1 puff at 11/25/21 0736   haloperidol (HALDOL) tablet 10 mg  10 mg Oral Q8H PRN Rankin, Shuvon B, NP        insulin aspart (novoLOG) injection 0-15 Units  0-15 Units Subcutaneous TID WC Rankin, Shuvon B, NP   8 Units at 11/24/21 1656   insulin glargine-yfgn (SEMGLEE) injection 12 Units  12 Units Subcutaneous Daily Rankin, Shuvon B, NP   12 Units at 11/24/21 1006   latanoprost (XALATAN) 0.005 % ophthalmic solution 1 drop  1 drop Both Eyes QHS Rankin, Shuvon B, NP   1 drop at 11/24/21 2122   magnesium hydroxide (MILK OF MAGNESIA) suspension 30 mL  30 mL Oral Daily PRN Rankin, Shuvon B, NP       metFORMIN (GLUCOPHAGE) tablet 500 mg  500 mg Oral BID WC Rankin, Shuvon B, NP   500  mg at 11/25/21 0736   mupirocin ointment (BACTROBAN) 2 % 1 Application  1 Application Topical TID Rankin, Shuvon B, NP   1 Application at 68/03/21 2127   nicotine (NICODERM CQ - dosed in mg/24 hr) patch 7 mg  7 mg Transdermal Daily Rankin, Shuvon B, NP   7 mg at 11/24/21 0943   [START ON 11/26/2021] Vitamin D3 CAPS 50,000 Units  50,000 Units Oral Q Thu Rankin, Shuvon B, NP       Current Outpatient Medications  Medication Sig Dispense Refill   Accu-Chek Softclix Lancets lancets Use as directed up to four times daily 100 each 0   apixaban (ELIQUIS) 5 MG TABS tablet Take 1 tablet (5 mg total) by mouth 2 (two) times daily. 60 tablet 0   ARIPiprazole (ABILIFY) 10 MG tablet Take 1 tablet (10 mg total) by mouth daily. 30 tablet 0   Blood Glucose Monitoring Suppl (ACCU-CHEK GUIDE) w/Device KIT Use as directed up to four times daily 1 kit 0   budesonide-formoterol (SYMBICORT) 160-4.5 MCG/ACT inhaler Inhale 2 puffs into the lungs in the morning and at bedtime.     Cholecalciferol (VITAMIN D3) 1.25 MG (50000 UT) CAPS Take 50,000 Units by mouth every Thursday.     cloZAPine (CLOZARIL) 25 MG tablet Take 3 tablets (75 mg total) by mouth at bedtime. 90 tablet 0   clozapine (CLOZARIL) 50 MG tablet Take 1 tablet (50 mg total) by mouth daily. 30 tablet 0   diltiazem (CARDIZEM CD) 240 MG 24 hr capsule Take 1 capsule (240 mg total) by mouth daily.  (Patient not taking: Reported on 11/24/2021) 30 capsule 0   divalproex (DEPAKOTE ER) 500 MG 24 hr tablet Take 1 tablet (500 mg total) by mouth 2 (two) times daily. 60 tablet 0   glucose blood test strip Use as directed up to four times daily 50 each 0   haloperidol (HALDOL) 10 MG tablet Take 1 tablet (10 mg total) by mouth 3 (three) times daily as needed for agitation (and psychotic symptoms).     INGREZZA 40 MG capsule Take 1 capsule (40 mg total) by mouth in the morning. 30 capsule 0   insulin aspart (NOVOLOG) 100 UNIT/ML FlexPen Before each meal 3 times a day, 140-199 - 2 units, 200-250 - 4 units, 251-299 - 6 units,  300-349 - 8 units,  350 or above 10 units. Insulin PEN if approved, provide syringes and needles if needed.Please switch to any approved short acting Insulin if needed. 15 mL 0   insulin glargine (LANTUS) 100 UNIT/ML Solostar Pen Inject 12 Units into the skin daily. 15 mL 0   Insulin Pen Needle 32G X 4 MM MISC Use 4 times a day with insulin, 1 month supply. 100 each 0   latanoprost (XALATAN) 0.005 % ophthalmic solution Place 1 drop into both eyes at bedtime.     metFORMIN (GLUCOPHAGE) 500 MG tablet Take 500 mg by mouth 2 (two) times daily with a meal.     nicotine (NICODERM CQ - DOSED IN MG/24 HR) 7 mg/24hr patch Place 1 patch (7 mg total) onto the skin daily. 28 patch 0   PROAIR HFA 108 (90 Base) MCG/ACT inhaler Inhale 2 puffs into the lungs every 6 (six) hours as needed for wheezing or shortness of breath.      Labs  Lab Results:  Admission on 11/23/2021  Component Date Value Ref Range Status   SARS Coronavirus 2 by RT PCR 11/23/2021 NEGATIVE  NEGATIVE Final   Comment: (  NOTE) SARS-CoV-2 target nucleic acids are NOT DETECTED.  The SARS-CoV-2 RNA is generally detectable in upper respiratory specimens during the acute phase of infection. The lowest concentration of SARS-CoV-2 viral copies this assay can detect is 138 copies/mL. A negative result does not preclude  SARS-Cov-2 infection and should not be used as the sole basis for treatment or other patient management decisions. A negative result may occur with  improper specimen collection/handling, submission of specimen other than nasopharyngeal swab, presence of viral mutation(s) within the areas targeted by this assay, and inadequate number of viral copies(<138 copies/mL). A negative result must be combined with clinical observations, patient history, and epidemiological information. The expected result is Negative.  Fact Sheet for Patients:  EntrepreneurPulse.com.au  Fact Sheet for Healthcare Providers:  IncredibleEmployment.be  This test is no                          t yet approved or cleared by the [place] FDA and  has been authorized for detection and/or diagnosis of SARS-CoV-2 by FDA under an Emergency Use Authorization (EUA). This EUA will remain  in effect (meaning this test can be used) for the duration of the COVID-19 declaration under Section 564(b)(1) of the Act, 21 U.S.C.section 360bbb-3(b)(1), unless the authorization is terminated  or revoked sooner.       Influenza A by PCR 11/23/2021 NEGATIVE  NEGATIVE Final   Influenza B by PCR 11/23/2021 NEGATIVE  NEGATIVE Final   Comment: (NOTE) The Xpert Xpress SARS-CoV-2/FLU/RSV plus assay is intended as an aid in the diagnosis of influenza from Nasopharyngeal swab specimens and should not be used as a sole basis for treatment. Nasal washings and aspirates are unacceptable for Xpert Xpress SARS-CoV-2/FLU/RSV testing.  Fact Sheet for Patients: EntrepreneurPulse.com.au  Fact Sheet for Healthcare Providers: IncredibleEmployment.be  This test is not yet approved or cleared by the [place] FDA and has been authorized for detection and/or diagnosis of SARS-CoV-2 by FDA under an Emergency Use Authorization (EUA). This EUA will remain in effect  (meaning this test can be used) for the duration of the COVID-19 declaration under Section 564(b)(1) of the Act, 21 U.S.C. section 360bbb-3(b)(1), unless the authorization is terminated or revoked.  Performed at [facility], [place] [address], [place],  [address]    WBC 11/23/2021 7.4  4.0 - 10.5 K/uL Final   RBC 11/23/2021 4.25  3.87 - 5.11 MIL/uL Final   Hemoglobin 11/23/2021 11.1 (L)  12.0 - 15.0 g/dL Final   HCT 11/23/2021 34.6 (L)  36.0 - 46.0 % Final   MCV 11/23/2021 81.4  80.0 - 100.0 fL Final   MCH 11/23/2021 26.1  26.0 - 34.0 pg Final   MCHC 11/23/2021 32.1  30.0 - 36.0 g/dL Final   RDW 11/23/2021 17.7 (H)  11.5 - 15.5 % Final   Platelets 11/23/2021 365  150 - 400 K/uL Final   nRBC 11/23/2021 0.0  0.0 - 0.2 % Final   Neutrophils Relative % 11/23/2021 59  % Final   Neutro Abs 11/23/2021 4.3  1.7 - 7.7 K/uL Final   Lymphocytes Relative 11/23/2021 31  % Final   Lymphs Abs 11/23/2021 2.3  0.7 - 4.0 K/uL Final   Monocytes Relative 11/23/2021 7  % Final   Monocytes Absolute 11/23/2021 0.5  0.1 - 1.0 K/uL Final   Eosinophils Relative 11/23/2021 2  % Final   Eosinophils Absolute 11/23/2021 0.1  0.0 - 0.5 K/uL Final   Basophils  Relative 11/23/2021 1  % Final   Basophils Absolute 11/23/2021 0.1  0.0 - 0.1 K/uL Final   Immature Granulocytes 11/23/2021 0  % Final   Abs Immature Granulocytes 11/23/2021 0.01  0.00 - 0.07 K/uL Final   Performed at [facility], [place] [address], [place], [place] [address]   Sodium 11/23/2021 139  135 - 145 mmol/L Final   Potassium 11/23/2021 4.1  3.5 - 5.1 mmol/L Final   Chloride 11/23/2021 105  98 - 111 mmol/L Final   CO2 11/23/2021 26  22 - 32 mmol/L Final   Glucose, Bld 11/23/2021 101 (H)  70 - 99 mg/dL Final   Glucose reference range applies only to samples taken after fasting for at least 8 hours.   BUN 11/23/2021 5 (L)  6 - 20 mg/dL Final   Creatinine, Ser 11/23/2021 0.51  0.44 - 1.00 mg/dL Final   Calcium 11/23/2021 9.0  8.9 -  10.3 mg/dL Final   Total Protein 11/23/2021 6.7  6.5 - 8.1 g/dL Final   Albumin 11/23/2021 3.6  3.5 - 5.0 g/dL Final   AST 11/23/2021 24  15 - 41 U/L Final   ALT 11/23/2021 18  0 - 44 U/L Final   Alkaline Phosphatase 11/23/2021 62  38 - 126 U/L Final   Total Bilirubin 11/23/2021 0.4  0.3 - 1.2 mg/dL Final   GFR, Estimated 11/23/2021 >60  >60 mL/min Final   Comment: (NOTE) Calculated using the CKD-EPI Creatinine Equation (2021)    Anion gap 11/23/2021 8  5 - 15 Final   Performed at [facility] [address] [place], [place] [address]   Magnesium 11/23/2021 2.0  1.7 - 2.4 mg/dL Final   Performed at [facility] [address], [place], [place] [address]   Alcohol, Ethyl (B) 11/23/2021 <10  <10 mg/dL Final   Comment: (NOTE) Lowest detectable limit for serum alcohol is 10 mg/dL.  For medical purposes only. Performed at [facility], [place] [address], [place], [place] [address]    Prolactin 11/23/2021 3.8 (L)  4.8 - 23.3 ng/mL Final   Comment: (NOTE) Performed At: [facility] [place], [place] [id number] Rush Farmer MD SE:[id number]    Color, Urine 11/23/2021 YELLOW  YELLOW Final   APPearance 11/23/2021 CLEAR  CLEAR Final   Specific Gravity, Urine 11/23/2021 1.025  1.005 - 1.030 Final   pH 11/23/2021 6.5  5.0 - 8.0 Final   Glucose, UA 11/23/2021 NEGATIVE  NEGATIVE mg/dL Final   Hgb urine dipstick 11/23/2021 NEGATIVE  NEGATIVE Final   Bilirubin Urine 11/23/2021 NEGATIVE  NEGATIVE Final   Ketones, ur 11/23/2021 NEGATIVE  NEGATIVE mg/dL Final   Protein, ur 11/23/2021 NEGATIVE  NEGATIVE mg/dL Final   Nitrite 11/23/2021 NEGATIVE  NEGATIVE Final   Leukocytes,Ua 11/23/2021 NEGATIVE  NEGATIVE Final   Microscopic not done on urines with negative protein, blood, leukocytes, nitrite, or glucose < 500 mg/dL.   RBC / HPF 11/23/2021 0-5  0 - 5 RBC/hpf Final   WBC, UA 11/23/2021 0-5  0 - 5 WBC/hpf Final   Bacteria, UA 11/23/2021 NONE SEEN  NONE  SEEN Final   Squamous Epithelial / LPF 11/23/2021 0-5  0 - 5 Final   Mucus 11/23/2021 PRESENT   Final   Performed at [facility], [place] [address], [place], [place] [address]   POC Amphetamine UR 11/23/2021 None Detected  NONE DETECTED (Cut Off Level 1000 ng/mL) Final   POC Secobarbital (BAR)  11/23/2021 None Detected  NONE DETECTED (Cut Off Level 300 ng/mL) Final   POC Buprenorphine (BUP) 11/23/2021 None Detected  NONE DETECTED (Cut Off Level 10 ng/mL) Final   POC Oxazepam (BZO) 11/23/2021 None Detected  NONE DETECTED (Cut Off Level 300 ng/mL) Final   POC Cocaine UR 11/23/2021 None Detected  NONE DETECTED (Cut Off Level 300 ng/mL) Final   POC Methamphetamine UR 11/23/2021 None Detected  NONE DETECTED (Cut Off Level 1000 ng/mL) Final   POC Morphine 11/23/2021 None Detected  NONE DETECTED (Cut Off Level 300 ng/mL) Final   POC Methadone UR 11/23/2021 None Detected  NONE DETECTED (Cut Off Level 300 ng/mL) Final   POC Oxycodone UR 11/23/2021 None Detected  NONE DETECTED (Cut Off Level 100 ng/mL) Final   POC Marijuana UR 11/23/2021 None Detected  NONE DETECTED (Cut Off Level 50 ng/mL) Final   SARSCOV2ONAVIRUS 2 AG 11/23/2021 NEGATIVE  NEGATIVE Final   Comment: (NOTE) SARS-CoV-2 antigen NOT DETECTED.   Negative results are presumptive.  Negative results do not preclude SARS-CoV-2 infection and should not be used as the sole basis for treatment or other patient management decisions, including infection  control decisions, particularly in the presence of clinical signs and  symptoms consistent with COVID-19, or in those who have been in contact with the virus.  Negative results must be combined with clinical observations, patient history, and epidemiological information. The expected result is Negative.  Fact Sheet for Patients: HandmadeRecipes.com.cy  Fact Sheet for Healthcare Providers: FuneralLife.at  This test is not yet approved or  cleared by the [place] FDA and  has been authorized for detection and/or diagnosis of SARS-CoV-2 by FDA under an Emergency Use Authorization (EUA).  This EUA will remain in effect (meaning this test can be used) for the duration of  the COV                          ID-19 declaration under Section 564(b)(1) of the Act, 21 U.S.C. section 360bbb-3(b)(1), unless the authorization is terminated or revoked sooner.     Cholesterol 11/23/2021 171  0 - 200 mg/dL Final   Triglycerides 11/23/2021 125  <150 mg/dL Final   HDL 11/23/2021 65  >40 mg/dL Final   Total CHOL/HDL Ratio 11/23/2021 2.6  RATIO Final   VLDL 11/23/2021 25  0 - 40 mg/dL Final   LDL Cholesterol 11/23/2021 81  0 - 99 mg/dL Final   Comment:        Total Cholesterol/HDL:CHD Risk Coronary Heart Disease Risk Table                     Men   Women  1/2 Average Risk   3.4   3.3  Average Risk       5.0   4.4  2 X Average Risk   9.6   7.1  3 X Average Risk  23.4   11.0        Use the calculated Patient Ratio above and the CHD Risk Table to determine the patient's CHD Risk.        ATP III CLASSIFICATION (LDL):  <100     mg/dL   Optimal  100-129  mg/dL   Near or Above                    Optimal  130-159  mg/dL   Borderline  160-189  mg/dL   High  >190     mg/dL  Very High Performed at [facility], [place] [address], [place], [place] [address]    TSH 11/23/2021 1.788  0.350 - 4.500 uIU/mL Final   Comment: Performed by a 3rd Generation assay with a functional sensitivity of <=0.01 uIU/mL. Performed at [facility], [place] [address], [place], [place] [address]    Glucose-Capillary 11/23/2021 137 (H)  70 - 99 mg/dL Final   Glucose reference range applies only to samples taken after fasting for at least 8 hours.   Glucose-Capillary 11/24/2021 144 (H)  70 - 99 mg/dL Final   Glucose reference range applies only to samples taken after fasting for at least 8 hours.   Glucose-Capillary 11/24/2021 87  70 - 99 mg/dL Final    Glucose reference range applies only to samples taken after fasting for at least 8 hours.   Glucose-Capillary 11/24/2021 269 (H)  70 - 99 mg/dL Final   Glucose reference range applies only to samples taken after fasting for at least 8 hours.   Glucose-Capillary 11/25/2021 112 (H)  70 - 99 mg/dL Final   Glucose reference range applies only to samples taken after fasting for at least 8 hours.  Admission on 11/22/2021, Discharged on 11/22/2021  Component Date Value Ref Range Status   Glucose-Capillary 11/22/2021 169 (H)  70 - 99 mg/dL Final   Glucose reference range applies only to samples taken after fasting for at least 8 hours.  No results displayed because visit has over 200 results.    Admission on 10/04/2021, Discharged on 10/22/2021  Component Date Value Ref Range Status   SARS Coronavirus 2 by RT PCR 10/04/2021 NEGATIVE  NEGATIVE Final   Comment: (NOTE) SARS-CoV-2 target nucleic acids are NOT DETECTED.  The SARS-CoV-2 RNA is generally detectable in upper respiratory specimens during the acute phase of infection. The lowest concentration of SARS-CoV-2 viral copies this assay can detect is 138 copies/mL. A negative result does not preclude SARS-Cov-2 infection and should not be used as the sole basis for treatment or other patient management decisions. A negative result may occur with  improper specimen collection/handling, submission of specimen other than nasopharyngeal swab, presence of viral mutation(s) within the areas targeted by this assay, and inadequate number of viral copies(<138 copies/mL). A negative result must be combined with clinical observations, patient history, and epidemiological information. The expected result is Negative.  Fact Sheet for Patients:  EntrepreneurPulse.com.au  Fact Sheet for Healthcare Providers:  IncredibleEmployment.be  This test is no                          t yet approved or cleared by the [place] FDA and  has been authorized for detection and/or diagnosis of SARS-CoV-2 by FDA under an Emergency Use Authorization (EUA). This EUA will remain  in effect (meaning this test can be used) for the duration of the COVID-19 declaration under Section 564(b)(1) of the Act, 21 U.S.C.section 360bbb-3(b)(1), unless the authorization is terminated  or revoked sooner.       Influenza A by PCR 10/04/2021 NEGATIVE  NEGATIVE Final   Influenza B by PCR 10/04/2021 NEGATIVE  NEGATIVE Final   Comment: (NOTE) The Xpert Xpress SARS-CoV-2/FLU/RSV plus assay is intended as an aid in the diagnosis of influenza from Nasopharyngeal swab specimens and should not be used as a sole basis for treatment. Nasal washings and aspirates are unacceptable for Xpert Xpress SARS-CoV-2/FLU/RSV testing.  Fact Sheet for Patients: EntrepreneurPulse.com.au  Fact Sheet for Healthcare  Providers: IncredibleEmployment.be  This test is not yet approved or cleared by the [place] and has been authorized for detection and/or diagnosis of SARS-CoV-2 by FDA under an Emergency Use Authorization (EUA). This EUA will remain in effect (meaning this test can be used) for the duration of the COVID-19 declaration under Section 564(b)(1) of the Act, 21 U.S.C. section 360bbb-3(b)(1), unless the authorization is terminated or revoked.  Performed at [facility], [place] [address], [place], [place] [address]    WBC 10/04/2021 8.4  4.0 - 10.5 K/uL Final   RBC 10/04/2021 4.42  3.87 - 5.11 MIL/uL Final   Hemoglobin 10/04/2021 11.6 (L)  12.0 - 15.0 g/dL Final   HCT 10/04/2021 35.7 (L)  36.0 - 46.0 % Final   MCV 10/04/2021 80.8  80.0 - 100.0 fL Final   MCH 10/04/2021 26.2  26.0 - 34.0 pg Final   MCHC 10/04/2021 32.5  30.0 - 36.0 g/dL Final   RDW 10/04/2021 16.0 (H)  11.5 - 15.5 % Final   Platelets 10/04/2021 372  150 - 400 K/uL Final   nRBC 10/04/2021 0.0  0.0 - 0.2 % Final    Neutrophils Relative % 10/04/2021 68  % Final   Neutro Abs 10/04/2021 5.7  1.7 - 7.7 K/uL Final   Lymphocytes Relative 10/04/2021 22  % Final   Lymphs Abs 10/04/2021 1.8  0.7 - 4.0 K/uL Final   Monocytes Relative 10/04/2021 8  % Final   Monocytes Absolute 10/04/2021 0.7  0.1 - 1.0 K/uL Final   Eosinophils Relative 10/04/2021 1  % Final   Eosinophils Absolute 10/04/2021 0.1  0.0 - 0.5 K/uL Final   Basophils Relative 10/04/2021 1  % Final   Basophils Absolute 10/04/2021 0.1  0.0 - 0.1 K/uL Final   Immature Granulocytes 10/04/2021 0  % Final   Abs Immature Granulocytes 10/04/2021 0.03  0.00 - 0.07 K/uL Final   Performed at [facility], [place] [address] [place], [place] [address]   Sodium 10/04/2021 136  135 - 145 mmol/L Final   Potassium 10/04/2021 4.2  3.5 - 5.1 mmol/L Final   Chloride 10/04/2021 104  98 - 111 mmol/L Final   CO2 10/04/2021 25  22 - 32 mmol/L Final   Glucose, Bld 10/04/2021 100 (H)  70 - 99 mg/dL Final   Glucose reference range applies only to samples taken after fasting for at least 8 hours.   BUN 10/04/2021 9  6 - 20 mg/dL Final   Creatinine, Ser 10/04/2021 0.52  0.44 - 1.00 mg/dL Final   Calcium 10/04/2021 9.0  8.9 - 10.3 mg/dL Final   Total Protein 10/04/2021 7.0  6.5 - 8.1 g/dL Final   Albumin 10/04/2021 3.2 (L)  3.5 - 5.0 g/dL Final   AST 10/04/2021 13 (L)  15 - 41 U/L Final   ALT 10/04/2021 10  0 - 44 U/L Final   Alkaline Phosphatase 10/04/2021 61  38 - 126 U/L Final   Total Bilirubin 10/04/2021 0.3  0.3 - 1.2 mg/dL Final   GFR, Estimated 10/04/2021 >60  >60 mL/min Final   Comment: (NOTE) Calculated using the CKD-EPI Creatinine Equation (2021)    Anion gap 10/04/2021 7  5 - 15 Final   Performed at [facility] [address], [place], [place] [address]   Hgb A1c MFr Bld 10/04/2021 7.0 (H)  4.8 - 5.6 % Final   Comment: (NOTE) Pre diabetes:          5.7%-6.4%  Diabetes:              >6.4%  Glycemic control for   <7.0% adults with diabetes     Mean Plasma Glucose 10/04/2021 154.2  mg/dL Final   Performed at [facility] [address], [place], [place] [address]   Cholesterol 10/04/2021 178  0 - 200 mg/dL Final   Triglycerides 10/04/2021 155 (H)  <150 mg/dL Final   HDL 10/04/2021 52  >40 mg/dL Final   Total CHOL/HDL Ratio 10/04/2021 3.4  RATIO Final   VLDL 10/04/2021 31  0 - 40 mg/dL Final   LDL Cholesterol 10/04/2021 95  0 - 99 mg/dL Final   Comment:        Total Cholesterol/HDL:CHD Risk Coronary Heart Disease Risk Table                     Men   Women  1/2 Average Risk   3.4   3.3  Average Risk       5.0   4.4  2 X Average Risk   9.6   7.1  3 X Average Risk  23.4   11.0        Use the calculated Patient Ratio above and the CHD Risk Table to determine the patient's CHD Risk.        ATP III CLASSIFICATION (LDL):  <100     mg/dL   Optimal  100-129  mg/dL   Near or Above                    Optimal  130-159  mg/dL   Borderline  160-189  mg/dL   High  >190     mg/dL   Very High Performed at [facility] [address] [place], [place] [address]    POC Amphetamine UR 10/04/2021 None Detected  NONE DETECTED (Cut Off Level 1000 ng/mL) Final   POC Secobarbital (BAR) 10/04/2021 None Detected  NONE DETECTED (Cut Off Level 300 ng/mL) Final   POC Buprenorphine (BUP) 10/04/2021 None Detected  NONE DETECTED (Cut Off Level 10 ng/mL) Final   POC Oxazepam (BZO) 10/04/2021 None Detected  NONE DETECTED (Cut Off Level 300 ng/mL) Final   POC Cocaine UR 10/04/2021 None Detected  NONE DETECTED (Cut Off Level 300 ng/mL) Final   POC Methamphetamine UR 10/04/2021 None Detected  NONE DETECTED (Cut Off Level 1000 ng/mL) Final   POC Morphine 10/04/2021 None Detected  NONE DETECTED (Cut Off Level 300 ng/mL) Final   POC Methadone UR 10/04/2021 None Detected  NONE DETECTED (Cut Off Level 300 ng/mL) Final   POC Oxycodone UR 10/04/2021 Positive (A)  NONE DETECTED (Cut Off Level 100 ng/mL) Final   POC Marijuana UR 10/04/2021 None  Detected  NONE DETECTED (Cut Off Level 50 ng/mL) Final   SARSCOV2ONAVIRUS 2 AG 10/04/2021 NEGATIVE  NEGATIVE Final   Comment: (NOTE) SARS-CoV-2 antigen NOT DETECTED.   Negative results are presumptive.  Negative results do not preclude SARS-CoV-2 infection and should not be used as the sole basis for treatment or other patient management decisions, including infection  control decisions, particularly in the presence of clinical signs and  symptoms consistent with COVID-19, or in those who have been in contact with the virus.  Negative results must be combined with clinical observations, patient history, and epidemiological information. The expected result is Negative.  Fact Sheet for Patients: HandmadeRecipes.com.cy  Fact Sheet for Healthcare Providers: FuneralLife.at  This test is not yet approved  or cleared by the [place] and  has been authorized for detection and/or diagnosis of SARS-CoV-2 by FDA under an Emergency Use Authorization (EUA).  This EUA will remain in effect (meaning this test can be used) for the duration of  the COV                          ID-19 declaration under Section 564(b)(1) of the Act, 21 U.S.C. section 360bbb-3(b)(1), unless the authorization is terminated or revoked sooner.     Valproic Acid Lvl 10/04/2021 51  50.0 - 100.0 ug/mL Final   Performed at [facility] [address], [place], [place] [address]   Valproic Acid Lvl 10/08/2021 57  50.0 - 100.0 ug/mL Final   Performed at [facility] [address] [place], [place] [address]   Glucose-Capillary 10/09/2021 123 (H)  70 - 99 mg/dL Final   Glucose reference range applies only to samples taken after fasting for at least 8 hours.  Admission on 09/09/2021, Discharged on 09/10/2021  Component Date Value Ref Range Status   Sodium 09/09/2021 134 (L)  135 - 145 mmol/L Final   Potassium 09/09/2021 4.3  3.5 - 5.1 mmol/L Final   Chloride  09/09/2021 99  98 - 111 mmol/L Final   CO2 09/09/2021 25  22 - 32 mmol/L Final   Glucose, Bld 09/09/2021 107 (H)  70 - 99 mg/dL Final   Glucose reference range applies only to samples taken after fasting for at least 8 hours.   BUN 09/09/2021 12  6 - 20 mg/dL Final   Creatinine, Ser 09/09/2021 0.74  0.44 - 1.00 mg/dL Final   Calcium 09/09/2021 9.0  8.9 - 10.3 mg/dL Final   Total Protein 09/09/2021 7.4  6.5 - 8.1 g/dL Final   Albumin 09/09/2021 3.1 (L)  3.5 - 5.0 g/dL Final   AST 09/09/2021 13 (L)  15 - 41 U/L Final   ALT 09/09/2021 13  0 - 44 U/L Final   Alkaline Phosphatase 09/09/2021 66  38 - 126 U/L Final   Total Bilirubin 09/09/2021 0.2 (L)  0.3 - 1.2 mg/dL Final   GFR, Estimated 09/09/2021 >60  >60 mL/min Final   Comment: (NOTE) Calculated using the CKD-EPI Creatinine Equation (2021)    Anion gap 09/09/2021 10  5 - 15 Final   Performed at [facility], [place] [address] [place], [place] [address]   WBC 09/09/2021 8.5  4.0 - 10.5 K/uL Final   RBC 09/09/2021 4.53  3.87 - 5.11 MIL/uL Final   Hemoglobin 09/09/2021 11.8 (L)  12.0 - 15.0 g/dL Final   HCT 09/09/2021 38.0  36.0 - 46.0 % Final   MCV 09/09/2021 83.9  80.0 - 100.0 fL Final   MCH 09/09/2021 26.0  26.0 - 34.0 pg Final   MCHC 09/09/2021 31.1  30.0 - 36.0 g/dL Final   RDW 09/09/2021 17.8 (H)  11.5 - 15.5 % Final   Platelets 09/09/2021 442 (H)  150 - 400 K/uL Final   nRBC 09/09/2021 0.0  0.0 - 0.2 % Final   Neutrophils Relative % 09/09/2021 70  % Final   Neutro Abs 09/09/2021 5.9  1.7 - 7.7 K/uL Final   Lymphocytes Relative 09/09/2021 20  % Final   Lymphs Abs 09/09/2021 1.7  0.7 - 4.0 K/uL Final   Monocytes Relative 09/09/2021 8  % Final   Monocytes Absolute 09/09/2021 0.7  0.1 - 1.0 K/uL Final  Eosinophils Relative 09/09/2021 1  % Final   Eosinophils Absolute 09/09/2021 0.1  0.0 - 0.5 K/uL Final   Basophils Relative 09/09/2021 1  % Final   Basophils Absolute 09/09/2021 0.1  0.0 - 0.1 K/uL Final   Immature  Granulocytes 09/09/2021 0  % Final   Abs Immature Granulocytes 09/09/2021 0.03  0.00 - 0.07 K/uL Final   Performed at [facility], [place] [address] [place], [place] [address]   Ammonia 09/09/2021 37 (H)  9 - 35 umol/L Final   Comment: HEMOLYSIS AT THIS LEVEL MAY AFFECT RESULT Performed at [facility], [place] [address], [place], [place] [address]    Opiates 09/09/2021 NONE DETECTED  NONE DETECTED Final   Cocaine 09/09/2021 NONE DETECTED  NONE DETECTED Final   Benzodiazepines 09/09/2021 NONE DETECTED  NONE DETECTED Final   Amphetamines 09/09/2021 NONE DETECTED  NONE DETECTED Final   Tetrahydrocannabinol 09/09/2021 NONE DETECTED  NONE DETECTED Final   Barbiturates 09/09/2021 NONE DETECTED  NONE DETECTED Final   Comment: (NOTE) DRUG SCREEN FOR MEDICAL PURPOSES ONLY.  IF CONFIRMATION IS NEEDED FOR ANY PURPOSE, NOTIFY LAB WITHIN 5 DAYS.  LOWEST DETECTABLE LIMITS FOR URINE DRUG SCREEN Drug Class                     Cutoff (ng/mL) Amphetamine and metabolites    1000 Barbiturate and metabolites    200 Benzodiazepine                 694 Tricyclics and metabolites     300 Opiates and metabolites        300 Cocaine and metabolites        300 THC                            50 Performed at [facility], [place] [address], [place], [place] [address]    Alcohol, Ethyl (B) 09/09/2021 <10  <10 mg/dL Final   Comment: (NOTE) Lowest detectable limit for serum alcohol is 10 mg/dL.  For medical purposes only. Performed at [facility], [place] [address] [place], [place] [address]    Lipase 09/09/2021 33  11 - 51 U/L Final   Performed at [facility] [address] [place], [place] [address]   Color, Urine 09/09/2021 COLORLESS (A)  YELLOW Final   APPearance 09/09/2021 CLEAR  CLEAR Final   Specific Gravity, Urine 09/09/2021 1.002 (L)  1.005 - 1.030 Final   pH 09/09/2021 6.0  5.0 - 8.0 Final   Glucose, UA 09/09/2021 NEGATIVE  NEGATIVE mg/dL Final   Hgb urine dipstick  09/09/2021 NEGATIVE  NEGATIVE Final   Bilirubin Urine 09/09/2021 NEGATIVE  NEGATIVE Final   Ketones, ur 09/09/2021 NEGATIVE  NEGATIVE mg/dL Final   Protein, ur 09/09/2021 NEGATIVE  NEGATIVE mg/dL Final   Nitrite 09/09/2021 NEGATIVE  NEGATIVE Final   Leukocytes,Ua 09/09/2021 NEGATIVE  NEGATIVE Final   Performed at [facility] [address], [place], [place] [address]   Specimen Description 09/09/2021 URINE, CLEAN CATCH   Final   Special Requests 09/09/2021 NONE   Final   Culture 09/09/2021  (A)   Final                   Value:<10,000 COLONIES/mL INSIGNIFICANT GROWTH Performed at [facility], [place] [address] [place], [place] [address]    Report Status 09/09/2021 09/10/2021 FINAL   Final  D-Dimer, Quant 09/09/2021 0.92 (H)  0.00 - 0.50 ug/mL-FEU Final   Comment: (NOTE) At the manufacturer cut-off value of 0.5 g/mL FEU, this assay has a negative predictive value of 95-100%.This assay is intended for use in conjunction with a clinical pretest probability (PTP) assessment model to exclude pulmonary embolism (PE) and deep venous thrombosis (DVT) in outpatients suspected of PE or DVT. Results should be correlated with clinical presentation. Performed at [facility], [place] [address] [place], [place] [address]    Troponin I (High Sensitivity) 09/09/2021 5  <18 ng/L Final   Comment: (NOTE) Elevated high sensitivity troponin I (hsTnI) values and significant  changes across serial measurements may suggest ACS but many other  chronic and acute conditions are known to elevate hsTnI results.  Refer to the "Links" section for chest pain algorithms and additional  guidance. Performed at [facility], [place] [address], [place], [place] [address]    BP 09/10/2021 135/83  mmHg Final   S' Lateral 09/10/2021 2.30  cm Final   Area-P 1/2 09/10/2021 5.58  cm2 Final    Blood Alcohol level:  Lab Results  Component Value Date   ETH <10 11/23/2021   ETH <10 65/53/7482     Metabolic Disorder Labs: Lab Results  Component Value Date   HGBA1C 7.0 (H) 10/04/2021   MPG 154.2 10/04/2021   Lab Results  Component Value Date   PROLACTIN 3.8 (L) 11/23/2021   Lab Results  Component Value Date   CHOL 171 11/23/2021   TRIG 125 11/23/2021   HDL 65 11/23/2021   CHOLHDL 2.6 11/23/2021   VLDL 25 11/23/2021   LDLCALC 81 11/23/2021   LDLCALC 95 10/04/2021    Therapeutic Lab Levels: No results found for: "LITHIUM" Lab Results  Component Value Date   VALPROATE 45 (L) 11/09/2021   VALPROATE 57 10/08/2021   No results found for: "CBMZ"  Physical Findings   PHQ2-9    [place] ED from 11/23/2021 in [facility]  PHQ-2 Total Score 2  PHQ-9 Total Score 3      Flowsheet Row ED from 11/23/2021 in [facility] Most recent reading at 11/23/2021  6:44 PM ED from 11/22/2021 in [facility] DEPT Most recent reading at 11/22/2021  8:28 PM ED from 11/22/2021 in [facility]PT Most recent reading at 11/22/2021  1:24 AM  C-SSRS RISK CATEGORY No Risk No Risk No Risk        Musculoskeletal  Strength & Muscle Tone: within normal limits Gait & Station: normal Patient leans: N/A  Psychiatric Specialty Exam  Presentation  General Appearance: Appropriate for Environment; Casual  Eye Contact:Good  Speech:Clear and Coherent; Normal Rate  Speech Volume:Normal  Handedness:Right   Mood and Affect  Mood:Euthymic  Affect:Appropriate; Congruent   Thought Process  Thought Processes:Coherent; Linear  Descriptions of Associations:Intact  Orientation:Full (Time, Place and Person)  Thought Content:Logical  Diagnosis of Schizophrenia or Schizoaffective disorder in past: Yes  Duration of Psychotic Symptoms: No data recorded  Hallucinations:Hallucinations: None  Ideas of Reference:None  Suicidal Thoughts:Suicidal Thoughts: No  Homicidal  Thoughts:Homicidal Thoughts: No   Sensorium  Memory:Immediate Good; Recent Poor  Judgment:Impaired  Insight:Shallow   Executive Functions  Concentration:Good  Attention Span:Good  Recall:Good  Fund of Knowledge:Fair  Language:Fair   Psychomotor Activity  Psychomotor Activity:Psychomotor Activity: Normal   Assets  Assets:Communication Skills; Financial Resources/Insurance; Resilience   Sleep  Sleep:Sleep: Good Number of Hours of Sleep: 8  No data recorded  Physical Exam  Physical Exam Vitals and nursing note reviewed.  Constitutional:      Appearance: Normal appearance. She is well-developed and normal weight.  HENT:     Head: Normocephalic and atraumatic.     Nose: Nose normal.  Cardiovascular:     Rate and Rhythm: Normal rate.  Pulmonary:     Effort: Pulmonary effort is normal.  Musculoskeletal:        General: Normal range of motion.     Cervical back: Normal range of motion.  Skin:    General: Skin is warm and dry.  Neurological:     Mental Status: She is alert and oriented to person, place, and time.  Psychiatric:        Attention and Perception: Attention and perception normal.        Mood and Affect: Mood and affect normal.        Speech: Speech normal.        Behavior: Behavior normal. Behavior is cooperative.        Thought Content: Thought content normal.    Review of Systems  Constitutional: Negative.   HENT: Negative.    Eyes: Negative.   Respiratory: Negative.    Cardiovascular: Negative.   Gastrointestinal: Negative.   Genitourinary: Negative.   Musculoskeletal: Negative.   Skin: Negative.   Neurological: Negative.   Psychiatric/Behavioral: Negative.     Blood pressure 127/82, pulse 68, temperature 98 F (36.7 C), temperature source Oral, resp. rate 18, SpO2 100 %. There is no height or weight on file to calculate BMI.  Treatment Plan Summary: Daily contact with patient to assess and evaluate symptoms and progress in  treatment Patient reviewed with Dr. Hampton Abbot. Patient to remain in observation unit while safe disposition plan coordinated.  Patient remains voluntary at this time.  Lucky Rathke, FNP 11/25/2021 9:26 AM

## 2021-11-25 NOTE — Progress Notes (Signed)
[facility] called [facility] [place] in [place], [place] at [phone] and left a VM asking for a returned phone call regarding a plan of care for [facility].  [facility] called [place] worker, Johnell Comings at [phone].  Eddy left a VM asking for a returned phone call regarding updates and a plan of care for [facility].    [facility] left her personal number as well just in case they call during the day.  [facility] also sent a email to [place] to notify her as well.  Mikey Kirschner [facility]

## 2021-11-25 NOTE — ED Notes (Signed)
Pt laying down calm and cooperative. No c/o pain or distress.will continue to monitor for safety

## 2021-11-25 NOTE — ED Notes (Signed)
Pt is awake and sitting on edge of bed.  She has been given breakfast and coffee.   NO distress noted a this time.

## 2021-11-25 NOTE — ED Notes (Signed)
Pt currently sitting up watching tv. No distress noted.

## 2021-11-25 NOTE — ED Notes (Signed)
CBG=[id number]  

## 2021-11-25 NOTE — BH Assessment (Signed)
Care Management   Writer received a follow up phone call from the [place] worker.  Writer was informed that the APS worker assigned to work with [employer] is [facility] Tamala Julian [phone]).

## 2021-11-25 NOTE — ED Notes (Signed)
Pt calm and cooperative. No c/o pain or distress. Alert and orient x 4. Will continue to monitor for safety 

## 2021-11-25 NOTE — Care Management (Signed)
Care Management   Per NP, this patient has been psychiatrically cleared on 11-24-2021   Writer left another HIPPA compliant voice mail message for the Care Coordinator, Maryln Gottron [phone]).    Writer called back to speak to speak to [employer] supervisor [facility]).  Writer was informed that the supervisor is out of the office for today(11/25/21) and tomorrow (11/26/21)    The contact person for the supervisor is Coralie Keens.  A phone number was not provided for Coralie Keens.  Writer was only provided with an email ([email or web address]).   Writer send an email requesting a call back to discuss placement and safe discharge plan for the patient.

## 2021-11-26 LAB — GLUCOSE, CAPILLARY
Glucose-Capillary: 159 mg/dL — ABNORMAL HIGH (ref 70–99)
Glucose-Capillary: 122 mg/dL — ABNORMAL HIGH (ref 70–99)
Glucose-Capillary: 140 mg/dL — ABNORMAL HIGH (ref 70–99)
Glucose-Capillary: 102 mg/dL — ABNORMAL HIGH (ref 70–99)
Glucose-Capillary: 166 mg/dL — ABNORMAL HIGH (ref 70–99)

## 2021-11-26 NOTE — ED Provider Notes (Signed)
Behavioral Health Progress Note  Date and Time: 11/26/2021 12:42 PM Name: Teresa Murillo MRN:  [id number]  Subjective:  Patient states "I would like to go back to the group home, I would really like to be able to go outside and have a diet coke."  Teresa Murillo is reassessed, face-to-face, by nurse practitioner.  She is seated in observation area upon my approach, no apparent distress.  She is alert and oriented, pleasant and cooperative during assessment.  She reports plan to shower and change clothing prior to dinner.  She presents with euthymic mood, congruent affect.  She continues to deny suicidal and homicidal ideations.  She also denies auditory and visual hallucinations.  Denies symptoms of paranoia.  She easily contracts verbally for safety with this Probation officer.  She endorses average sleep and appetite.  Teresa Murillo prefers to return to previous group home however she would be open to ideas of alternative group homes.  Patient offered support and encouragement.  Updated regarding treatment plan to include social work team reaching out to previous group home as well as transitional [facility].  Offered opportunity to ask questions.  She verbalizes understanding of treatment plan.  Diagnosis:  Final diagnoses:  At risk for self care deficit  Noncompliance  Self-care deficit for medication administration  Schizophrenia, unspecified type ([place])    Total Time spent with patient: 30 minutes  Past Psychiatric History: schizoaffective disorder, schizophrenia, aggressive behavior Past Medical History:  Past Medical History:  Diagnosis Date   Chronic obstructive pulmonary disease (COPD) ([place])    Glaucoma    Hyperlipidemia    Hypertension    Iron deficiency    Schizoaffective disorder ([place])    Type 2 diabetes mellitus ([place])     Past Surgical History:  Procedure Laterality Date   TUBAL LIGATION     Family History:  Family History  Problem Relation Age of Onset   Breast cancer  Maternal Grandmother    Family Psychiatric  History: none reported Social History:  Social History   Substance and Sexual Activity  Alcohol Use Yes     Social History   Substance and Sexual Activity  Drug Use Not Currently    Social History   Socioeconomic History   Marital status: Divorced    Spouse name: Not on file   Number of children: Not on file   Years of education: Not on file   Highest education level: Not on file  Occupational History   Not on file  Tobacco Use   Smoking status: Every Day    Packs/day: 1.00    Types: Cigars, Cigarettes   Smokeless tobacco: Current  Vaping Use   Vaping Use: Never used  Substance and Sexual Activity   Alcohol use: Yes   Drug use: Not Currently   Sexual activity: Not Currently    Birth control/protection: Surgical  Other Topics Concern   Not on file  Social History Narrative   Not on file   Social Determinants of Health   Financial Resource Strain: Not on file  Food Insecurity: Not on file  Transportation Needs: Not on file  Physical Activity: Not on file  Stress: Not on file  Social Connections: Not on file   SDOH:  SDOH Screenings   Depression (PHQ2-9): Low Risk  (11/23/2021)  Tobacco Use: High Risk (11/23/2021)   Additional Social History:    Pain Medications: See MAR Prescriptions: See MAR Over the Counter: See MAR History of alcohol / drug use?: No history of alcohol / drug abuse  Longest period of sobriety (when/how long): N/A                    Sleep: Good  Appetite:  Good  Current Medications:  Current Facility-Administered Medications  Medication Dose Route Frequency Provider Last Rate Last Admin   acetaminophen (TYLENOL) tablet 650 mg  650 mg Oral Q6H PRN Rankin, Shuvon B, NP   650 mg at 11/26/21 0132   albuterol (VENTOLIN HFA) 108 (90 Base) MCG/ACT inhaler 2 puff  2 puff Inhalation Q6H PRN Rankin, Shuvon B, NP       alum & mag hydroxide-simeth (MAALOX/MYLANTA) 200-200-20 MG/5ML  suspension 30 mL  30 mL Oral Q4H PRN Rankin, Shuvon B, NP       apixaban (ELIQUIS) tablet 5 mg  5 mg Oral BID Rankin, Shuvon B, NP   5 mg at 11/26/21 0933   cloZAPine (CLOZARIL) tablet 50 mg  50 mg Oral Daily [name], NP   50 mg at 11/26/21 0932   cloZAPine (CLOZARIL) tablet 75 mg  75 mg Oral QHS [name], NP   75 mg at 11/25/21 2106   diltiazem (CARDIZEM CD) 24 hr capsule 240 mg  240 mg Oral Daily Rankin, Shuvon B, NP   240 mg at 11/26/21 0932   divalproex (DEPAKOTE ER) 24 hr tablet 500 mg  500 mg Oral BID Rankin, Shuvon B, NP   500 mg at 11/26/21 0932   fluticasone furoate-vilanterol (BREO ELLIPTA) 200-25 MCG/ACT 1 puff  1 puff Inhalation Daily Rankin, Shuvon B, NP   1 puff at 11/26/21 0809   haloperidol (HALDOL) tablet 10 mg  10 mg Oral Q8H PRN Rankin, Shuvon B, NP       insulin aspart (novoLOG) injection 0-15 Units  0-15 Units Subcutaneous TID WC Rankin, Shuvon B, NP   2 Units at 11/26/21 1230   insulin glargine-yfgn (SEMGLEE) injection 12 Units  12 Units Subcutaneous Daily Rankin, Shuvon B, NP   12 Units at 11/26/21 0932   latanoprost (XALATAN) 0.005 % ophthalmic solution 1 drop  1 drop Both Eyes QHS Rankin, Shuvon B, NP   1 drop at 11/25/21 2105   magnesium hydroxide (MILK OF MAGNESIA) suspension 30 mL  30 mL Oral Daily PRN Rankin, Shuvon B, NP       metFORMIN (GLUCOPHAGE) tablet 500 mg  500 mg Oral BID WC Rankin, Shuvon B, NP   500 mg at 11/26/21 0809   mupirocin ointment (BACTROBAN) 2 % 1 Application  1 Application Topical TID Rankin, Shuvon B, NP   1 Application at 01/74/94 0935   nicotine (NICODERM CQ - dosed in mg/24 hr) patch 7 mg  7 mg Transdermal Daily Rankin, Shuvon B, NP   7 mg at 11/26/21 0933   Vitamin D3 CAPS 50,000 Units  50,000 Units Oral Q Thu Rankin, Shuvon B, NP   50,000 Units at 11/26/21 4967   Current Outpatient Medications  Medication Sig Dispense Refill   Accu-Chek Softclix Lancets lancets Use as directed up to four times daily 100 each 0   apixaban  (ELIQUIS) 5 MG TABS tablet Take 1 tablet (5 mg total) by mouth 2 (two) times daily. 60 tablet 0   ARIPiprazole (ABILIFY) 10 MG tablet Take 1 tablet (10 mg total) by mouth daily. 30 tablet 0   Blood Glucose Monitoring Suppl (ACCU-CHEK GUIDE) w/Device KIT Use as directed up to four times daily 1 kit 0   budesonide-formoterol (SYMBICORT) 160-4.5 MCG/ACT inhaler Inhale 2 puffs into the lungs in the  morning and at bedtime.     Cholecalciferol (VITAMIN D3) 1.25 MG (50000 UT) CAPS Take 50,000 Units by mouth every Thursday.     cloZAPine (CLOZARIL) 25 MG tablet Take 3 tablets (75 mg total) by mouth at bedtime. 90 tablet 0   clozapine (CLOZARIL) 50 MG tablet Take 1 tablet (50 mg total) by mouth daily. 30 tablet 0   diltiazem (CARDIZEM CD) 240 MG 24 hr capsule Take 1 capsule (240 mg total) by mouth daily. (Patient not taking: Reported on 11/24/2021) 30 capsule 0   divalproex (DEPAKOTE ER) 500 MG 24 hr tablet Take 1 tablet (500 mg total) by mouth 2 (two) times daily. 60 tablet 0   glucose blood test strip Use as directed up to four times daily 50 each 0   haloperidol (HALDOL) 10 MG tablet Take 1 tablet (10 mg total) by mouth 3 (three) times daily as needed for agitation (and psychotic symptoms).     INGREZZA 40 MG capsule Take 1 capsule (40 mg total) by mouth in the morning. 30 capsule 0   insulin aspart (NOVOLOG) 100 UNIT/ML FlexPen Before each meal 3 times a day, 140-199 - 2 units, 200-250 - 4 units, 251-299 - 6 units,  300-349 - 8 units,  350 or above 10 units. Insulin PEN if approved, provide syringes and needles if needed.Please switch to any approved short acting Insulin if needed. 15 mL 0   insulin glargine (LANTUS) 100 UNIT/ML Solostar Pen Inject 12 Units into the skin daily. 15 mL 0   Insulin Pen Needle 32G X 4 MM MISC Use 4 times a day with insulin, 1 month supply. 100 each 0   latanoprost (XALATAN) 0.005 % ophthalmic solution Place 1 drop into both eyes at bedtime.     metFORMIN (GLUCOPHAGE) 500 MG  tablet Take 500 mg by mouth 2 (two) times daily with a meal.     nicotine (NICODERM CQ - DOSED IN MG/24 HR) 7 mg/24hr patch Place 1 patch (7 mg total) onto the skin daily. 28 patch 0   PROAIR HFA 108 (90 Base) MCG/ACT inhaler Inhale 2 puffs into the lungs every 6 (six) hours as needed for wheezing or shortness of breath.      Labs  Lab Results:  Admission on 11/23/2021  Component Date Value Ref Range Status   SARS Coronavirus 2 by RT PCR 11/23/2021 NEGATIVE  NEGATIVE Final   Comment: (NOTE) SARS-CoV-2 target nucleic acids are NOT DETECTED.  The SARS-CoV-2 RNA is generally detectable in upper respiratory specimens during the acute phase of infection. The lowest concentration of SARS-CoV-2 viral copies this assay can detect is 138 copies/mL. A negative result does not preclude SARS-Cov-2 infection and should not be used as the sole basis for treatment or other patient management decisions. A negative result may occur with  improper specimen collection/handling, submission of specimen other than nasopharyngeal swab, presence of viral mutation(s) within the areas targeted by this assay, and inadequate number of viral copies(<138 copies/mL). A negative result must be combined with clinical observations, patient history, and epidemiological information. The expected result is Negative.  Fact Sheet for Patients:  EntrepreneurPulse.com.au  Fact Sheet for Healthcare Providers:  IncredibleEmployment.be  This test is no                          t yet approved or cleared by the [place] FDA and  has been authorized for detection and/or diagnosis of SARS-CoV-2 by FDA under an  Emergency Use Authorization (EUA). This EUA will remain  in effect (meaning this test can be used) for the duration of the COVID-19 declaration under Section 564(b)(1) of the Act, 21 U.S.C.section 360bbb-3(b)(1), unless the authorization is terminated  or revoked sooner.        Influenza A by PCR 11/23/2021 NEGATIVE  NEGATIVE Final   Influenza B by PCR 11/23/2021 NEGATIVE  NEGATIVE Final   Comment: (NOTE) The Xpert Xpress SARS-CoV-2/FLU/RSV plus assay is intended as an aid in the diagnosis of influenza from Nasopharyngeal swab specimens and should not be used as a sole basis for treatment. Nasal washings and aspirates are unacceptable for Xpert Xpress SARS-CoV-2/FLU/RSV testing.  Fact Sheet for Patients: EntrepreneurPulse.com.au  Fact Sheet for Healthcare Providers: IncredibleEmployment.be  This test is not yet approved or cleared by the [place] FDA and has been authorized for detection and/or diagnosis of SARS-CoV-2 by FDA under an Emergency Use Authorization (EUA). This EUA will remain in effect (meaning this test can be used) for the duration of the COVID-19 declaration under Section 564(b)(1) of the Act, 21 U.S.C. section 360bbb-3(b)(1), unless the authorization is terminated or revoked.  Performed at [facility], [place] [address] [place], [place] [address]    WBC 11/23/2021 7.4  4.0 - 10.5 K/uL Final   RBC 11/23/2021 4.25  3.87 - 5.11 MIL/uL Final   Hemoglobin 11/23/2021 11.1 (L)  12.0 - 15.0 g/dL Final   HCT 11/23/2021 34.6 (L)  36.0 - 46.0 % Final   MCV 11/23/2021 81.4  80.0 - 100.0 fL Final   MCH 11/23/2021 26.1  26.0 - 34.0 pg Final   MCHC 11/23/2021 32.1  30.0 - 36.0 g/dL Final   RDW 11/23/2021 17.7 (H)  11.5 - 15.5 % Final   Platelets 11/23/2021 365  150 - 400 K/uL Final   nRBC 11/23/2021 0.0  0.0 - 0.2 % Final   Neutrophils Relative % 11/23/2021 59  % Final   Neutro Abs 11/23/2021 4.3  1.7 - 7.7 K/uL Final   Lymphocytes Relative 11/23/2021 31  % Final   Lymphs Abs 11/23/2021 2.3  0.7 - 4.0 K/uL Final   Monocytes Relative 11/23/2021 7  % Final   Monocytes Absolute 11/23/2021 0.5  0.1 - 1.0 K/uL Final   Eosinophils Relative 11/23/2021 2  % Final   Eosinophils Absolute 11/23/2021 0.1  0.0  - 0.5 K/uL Final   Basophils Relative 11/23/2021 1  % Final   Basophils Absolute 11/23/2021 0.1  0.0 - 0.1 K/uL Final   Immature Granulocytes 11/23/2021 0  % Final   Abs Immature Granulocytes 11/23/2021 0.01  0.00 - 0.07 K/uL Final   Performed at [facility], [place] [address], [place], [place] [address]   Sodium 11/23/2021 139  135 - 145 mmol/L Final   Potassium 11/23/2021 4.1  3.5 - 5.1 mmol/L Final   Chloride 11/23/2021 105  98 - 111 mmol/L Final   CO2 11/23/2021 26  22 - 32 mmol/L Final   Glucose, Bld 11/23/2021 101 (H)  70 - 99 mg/dL Final   Glucose reference range applies only to samples taken after fasting for at least 8 hours.   BUN 11/23/2021 5 (L)  6 - 20 mg/dL Final   Creatinine, Ser 11/23/2021 0.51  0.44 - 1.00 mg/dL Final   Calcium 11/23/2021 9.0  8.9 - 10.3 mg/dL Final   Total Protein 11/23/2021 6.7  6.5 - 8.1 g/dL Final   Albumin 11/23/2021 3.6  3.5 - 5.0 g/dL Final  AST 11/23/2021 24  15 - 41 U/L Final   ALT 11/23/2021 18  0 - 44 U/L Final   Alkaline Phosphatase 11/23/2021 62  38 - 126 U/L Final   Total Bilirubin 11/23/2021 0.4  0.3 - 1.2 mg/dL Final   GFR, Estimated 11/23/2021 >60  >60 mL/min Final   Comment: (NOTE) Calculated using the CKD-EPI Creatinine Equation (2021)    Anion gap 11/23/2021 8  5 - 15 Final   Performed at [facility], [place] [address], [place], [place] [address]   Magnesium 11/23/2021 2.0  1.7 - 2.4 mg/dL Final   Performed at [facility] [address], [place], [place] [address]   Alcohol, Ethyl (B) 11/23/2021 <10  <10 mg/dL Final   Comment: (NOTE) Lowest detectable limit for serum alcohol is 10 mg/dL.  For medical purposes only. Performed at [facility], [place] [address], [place], [place] [address]    Prolactin 11/23/2021 3.8 (L)  4.8 - 23.3 ng/mL Final   Comment: (NOTE) Performed At: [facility] [place], [place] [id number] Rush Farmer MD IR:[id number]    Color, Urine 11/23/2021 YELLOW   YELLOW Final   APPearance 11/23/2021 CLEAR  CLEAR Final   Specific Gravity, Urine 11/23/2021 1.025  1.005 - 1.030 Final   pH 11/23/2021 6.5  5.0 - 8.0 Final   Glucose, UA 11/23/2021 NEGATIVE  NEGATIVE mg/dL Final   Hgb urine dipstick 11/23/2021 NEGATIVE  NEGATIVE Final   Bilirubin Urine 11/23/2021 NEGATIVE  NEGATIVE Final   Ketones, ur 11/23/2021 NEGATIVE  NEGATIVE mg/dL Final   Protein, ur 11/23/2021 NEGATIVE  NEGATIVE mg/dL Final   Nitrite 11/23/2021 NEGATIVE  NEGATIVE Final   Leukocytes,Ua 11/23/2021 NEGATIVE  NEGATIVE Final   Microscopic not done on urines with negative protein, blood, leukocytes, nitrite, or glucose < 500 mg/dL.   RBC / HPF 11/23/2021 0-5  0 - 5 RBC/hpf Final   WBC, UA 11/23/2021 0-5  0 - 5 WBC/hpf Final   Bacteria, UA 11/23/2021 NONE SEEN  NONE SEEN Final   Squamous Epithelial / LPF 11/23/2021 0-5  0 - 5 Final   Mucus 11/23/2021 PRESENT   Final   Performed at [facility], [place] [address] [place], [place] [address]   POC Amphetamine UR 11/23/2021 None Detected  NONE DETECTED (Cut Off Level 1000 ng/mL) Final   POC Secobarbital (BAR) 11/23/2021 None Detected  NONE DETECTED (Cut Off Level 300 ng/mL) Final   POC Buprenorphine (BUP) 11/23/2021 None Detected  NONE DETECTED (Cut Off Level 10 ng/mL) Final   POC Oxazepam (BZO) 11/23/2021 None Detected  NONE DETECTED (Cut Off Level 300 ng/mL) Final   POC Cocaine UR 11/23/2021 None Detected  NONE DETECTED (Cut Off Level 300 ng/mL) Final   POC Methamphetamine UR 11/23/2021 None Detected  NONE DETECTED (Cut Off Level 1000 ng/mL) Final   POC Morphine 11/23/2021 None Detected  NONE DETECTED (Cut Off Level 300 ng/mL) Final   POC Methadone UR 11/23/2021 None Detected  NONE DETECTED (Cut Off Level 300 ng/mL) Final   POC Oxycodone UR 11/23/2021 None Detected  NONE DETECTED (Cut Off Level 100 ng/mL) Final   POC Marijuana UR 11/23/2021 None Detected  NONE DETECTED (Cut Off Level 50 ng/mL) Final   SARSCOV2ONAVIRUS 2 AG 11/23/2021  NEGATIVE  NEGATIVE Final   Comment: (NOTE) SARS-CoV-2 antigen NOT DETECTED.   Negative results are presumptive.  Negative results do not preclude SARS-CoV-2 infection and should not be used as the sole basis for  treatment or other patient management decisions, including infection  control decisions, particularly in the presence of clinical signs and  symptoms consistent with COVID-19, or in those who have been in contact with the virus.  Negative results must be combined with clinical observations, patient history, and epidemiological information. The expected result is Negative.  Fact Sheet for Patients: HandmadeRecipes.com.cy  Fact Sheet for Healthcare Providers: FuneralLife.at  This test is not yet approved or cleared by the [place] FDA and  has been authorized for detection and/or diagnosis of SARS-CoV-2 by FDA under an Emergency Use Authorization (EUA).  This EUA will remain in effect (meaning this test can be used) for the duration of  the COV                          ID-19 declaration under Section 564(b)(1) of the Act, 21 U.S.C. section 360bbb-3(b)(1), unless the authorization is terminated or revoked sooner.     Cholesterol 11/23/2021 171  0 - 200 mg/dL Final   Triglycerides 11/23/2021 125  <150 mg/dL Final   HDL 11/23/2021 65  >40 mg/dL Final   Total CHOL/HDL Ratio 11/23/2021 2.6  RATIO Final   VLDL 11/23/2021 25  0 - 40 mg/dL Final   LDL Cholesterol 11/23/2021 81  0 - 99 mg/dL Final   Comment:        Total Cholesterol/HDL:CHD Risk Coronary Heart Disease Risk Table                     Men   Women  1/2 Average Risk   3.4   3.3  Average Risk       5.0   4.4  2 X Average Risk   9.6   7.1  3 X Average Risk  23.4   11.0        Use the calculated Patient Ratio above and the CHD Risk Table to determine the patient's CHD Risk.        ATP III CLASSIFICATION (LDL):  <100     mg/dL   Optimal  100-129  mg/dL   Near or  Above                    Optimal  130-159  mg/dL   Borderline  160-189  mg/dL   High  >190     mg/dL   Very High Performed at [facility] [address] [place], [place] [address]    TSH 11/23/2021 1.788  0.350 - 4.500 uIU/mL Final   Comment: Performed by a 3rd Generation assay with a functional sensitivity of <=0.01 uIU/mL. Performed at [facility], [place] [address] [place], [place] [address]    Glucose-Capillary 11/23/2021 137 (H)  70 - 99 mg/dL Final   Glucose reference range applies only to samples taken after fasting for at least 8 hours.   Glucose-Capillary 11/24/2021 144 (H)  70 - 99 mg/dL Final   Glucose reference range applies only to samples taken after fasting for at least 8 hours.   Glucose-Capillary 11/24/2021 87  70 - 99 mg/dL Final   Glucose reference range applies only to samples taken after fasting for at least 8 hours.   Glucose-Capillary 11/24/2021 269 (H)  70 - 99 mg/dL Final   Glucose reference range applies only to samples taken after fasting for at least 8 hours.   Glucose-Capillary 11/25/2021 112 (H)  70 - 99 mg/dL Final   Glucose  reference range applies only to samples taken after fasting for at least 8 hours.   Glucose-Capillary 11/25/2021 136 (H)  70 - 99 mg/dL Final   Glucose reference range applies only to samples taken after fasting for at least 8 hours.   Glucose-Capillary 11/25/2021 187 (H)  70 - 99 mg/dL Final   Glucose reference range applies only to samples taken after fasting for at least 8 hours.   Glucose-Capillary 11/25/2021 159 (H)  70 - 99 mg/dL Final   Glucose reference range applies only to samples taken after fasting for at least 8 hours.   Glucose-Capillary 11/26/2021 122 (H)  70 - 99 mg/dL Final   Glucose reference range applies only to samples taken after fasting for at least 8 hours.   Glucose-Capillary 11/26/2021 140 (H)  70 - 99 mg/dL Final   Glucose reference range applies only to samples taken after fasting for at least  8 hours.  Admission on 11/22/2021, Discharged on 11/22/2021  Component Date Value Ref Range Status   Glucose-Capillary 11/22/2021 169 (H)  70 - 99 mg/dL Final   Glucose reference range applies only to samples taken after fasting for at least 8 hours.  No results displayed because visit has over 200 results.    Admission on 10/04/2021, Discharged on 10/22/2021  Component Date Value Ref Range Status   SARS Coronavirus 2 by RT PCR 10/04/2021 NEGATIVE  NEGATIVE Final   Comment: (NOTE) SARS-CoV-2 target nucleic acids are NOT DETECTED.  The SARS-CoV-2 RNA is generally detectable in upper respiratory specimens during the acute phase of infection. The lowest concentration of SARS-CoV-2 viral copies this assay can detect is 138 copies/mL. A negative result does not preclude SARS-Cov-2 infection and should not be used as the sole basis for treatment or other patient management decisions. A negative result may occur with  improper specimen collection/handling, submission of specimen other than nasopharyngeal swab, presence of viral mutation(s) within the areas targeted by this assay, and inadequate number of viral copies(<138 copies/mL). A negative result must be combined with clinical observations, patient history, and epidemiological information. The expected result is Negative.  Fact Sheet for Patients:  EntrepreneurPulse.com.au  Fact Sheet for Healthcare Providers:  IncredibleEmployment.be  This test is no                          t yet approved or cleared by the [place] FDA and  has been authorized for detection and/or diagnosis of SARS-CoV-2 by FDA under an Emergency Use Authorization (EUA). This EUA will remain  in effect (meaning this test can be used) for the duration of the COVID-19 declaration under Section 564(b)(1) of the Act, 21 U.S.C.section 360bbb-3(b)(1), unless the authorization is terminated  or revoked sooner.        Influenza A by PCR 10/04/2021 NEGATIVE  NEGATIVE Final   Influenza B by PCR 10/04/2021 NEGATIVE  NEGATIVE Final   Comment: (NOTE) The Xpert Xpress SARS-CoV-2/FLU/RSV plus assay is intended as an aid in the diagnosis of influenza from Nasopharyngeal swab specimens and should not be used as a sole basis for treatment. Nasal washings and aspirates are unacceptable for Xpert Xpress SARS-CoV-2/FLU/RSV testing.  Fact Sheet for Patients: EntrepreneurPulse.com.au  Fact Sheet for Healthcare Providers: IncredibleEmployment.be  This test is not yet approved or cleared by the [place] FDA and has been authorized for detection and/or diagnosis of SARS-CoV-2 by FDA under an Emergency Use Authorization (EUA). This EUA will remain in effect (meaning this  test can be used) for the duration of the COVID-19 declaration under Section 564(b)(1) of the Act, 21 U.S.C. section 360bbb-3(b)(1), unless the authorization is terminated or revoked.  Performed at [facility], [place] [address] [place], [place] [address]    WBC 10/04/2021 8.4  4.0 - 10.5 K/uL Final   RBC 10/04/2021 4.42  3.87 - 5.11 MIL/uL Final   Hemoglobin 10/04/2021 11.6 (L)  12.0 - 15.0 g/dL Final   HCT 10/04/2021 35.7 (L)  36.0 - 46.0 % Final   MCV 10/04/2021 80.8  80.0 - 100.0 fL Final   MCH 10/04/2021 26.2  26.0 - 34.0 pg Final   MCHC 10/04/2021 32.5  30.0 - 36.0 g/dL Final   RDW 10/04/2021 16.0 (H)  11.5 - 15.5 % Final   Platelets 10/04/2021 372  150 - 400 K/uL Final   nRBC 10/04/2021 0.0  0.0 - 0.2 % Final   Neutrophils Relative % 10/04/2021 68  % Final   Neutro Abs 10/04/2021 5.7  1.7 - 7.7 K/uL Final   Lymphocytes Relative 10/04/2021 22  % Final   Lymphs Abs 10/04/2021 1.8  0.7 - 4.0 K/uL Final   Monocytes Relative 10/04/2021 8  % Final   Monocytes Absolute 10/04/2021 0.7  0.1 - 1.0 K/uL Final   Eosinophils Relative 10/04/2021 1  % Final   Eosinophils Absolute 10/04/2021 0.1  0.0 -  0.5 K/uL Final   Basophils Relative 10/04/2021 1  % Final   Basophils Absolute 10/04/2021 0.1  0.0 - 0.1 K/uL Final   Immature Granulocytes 10/04/2021 0  % Final   Abs Immature Granulocytes 10/04/2021 0.03  0.00 - 0.07 K/uL Final   Performed at [facility], [place] [address] [place], [place] [address]   Sodium 10/04/2021 136  135 - 145 mmol/L Final   Potassium 10/04/2021 4.2  3.5 - 5.1 mmol/L Final   Chloride 10/04/2021 104  98 - 111 mmol/L Final   CO2 10/04/2021 25  22 - 32 mmol/L Final   Glucose, Bld 10/04/2021 100 (H)  70 - 99 mg/dL Final   Glucose reference range applies only to samples taken after fasting for at least 8 hours.   BUN 10/04/2021 9  6 - 20 mg/dL Final   Creatinine, Ser 10/04/2021 0.52  0.44 - 1.00 mg/dL Final   Calcium 10/04/2021 9.0  8.9 - 10.3 mg/dL Final   Total Protein 10/04/2021 7.0  6.5 - 8.1 g/dL Final   Albumin 10/04/2021 3.2 (L)  3.5 - 5.0 g/dL Final   AST 10/04/2021 13 (L)  15 - 41 U/L Final   ALT 10/04/2021 10  0 - 44 U/L Final   Alkaline Phosphatase 10/04/2021 61  38 - 126 U/L Final   Total Bilirubin 10/04/2021 0.3  0.3 - 1.2 mg/dL Final   GFR, Estimated 10/04/2021 >60  >60 mL/min Final   Comment: (NOTE) Calculated using the CKD-EPI Creatinine Equation (2021)    Anion gap 10/04/2021 7  5 - 15 Final   Performed at [facility] [address], [place], [place] [address]   Hgb A1c MFr Bld 10/04/2021 7.0 (H)  4.8 - 5.6 % Final   Comment: (NOTE) Pre diabetes:          5.7%-6.4%  Diabetes:              >6.4%  Glycemic control for   <7.0% adults with diabetes    Mean Plasma Glucose 10/04/2021 154.2  mg/dL Final   Performed at [facility], [place] [address] [place], [place] [address]   Cholesterol 10/04/2021 178  0 - 200 mg/dL Final   Triglycerides 10/04/2021 155 (H)  <150 mg/dL Final   HDL 10/04/2021 52  >40 mg/dL Final   Total CHOL/HDL Ratio 10/04/2021 3.4  RATIO Final   VLDL 10/04/2021 31  0 - 40 mg/dL Final   LDL Cholesterol  10/04/2021 95  0 - 99 mg/dL Final   Comment:        Total Cholesterol/HDL:CHD Risk Coronary Heart Disease Risk Table                     Men   Women  1/2 Average Risk   3.4   3.3  Average Risk       5.0   4.4  2 X Average Risk   9.6   7.1  3 X Average Risk  23.4   11.0        Use the calculated Patient Ratio above and the CHD Risk Table to determine the patient's CHD Risk.        ATP III CLASSIFICATION (LDL):  <100     mg/dL   Optimal  100-129  mg/dL   Near or Above                    Optimal  130-159  mg/dL   Borderline  160-189  mg/dL   High  >190     mg/dL   Very High Performed at [facility] [address], [place], [place] [address]    POC Amphetamine UR 10/04/2021 None Detected  NONE DETECTED (Cut Off Level 1000 ng/mL) Final   POC Secobarbital (BAR) 10/04/2021 None Detected  NONE DETECTED (Cut Off Level 300 ng/mL) Final   POC Buprenorphine (BUP) 10/04/2021 None Detected  NONE DETECTED (Cut Off Level 10 ng/mL) Final   POC Oxazepam (BZO) 10/04/2021 None Detected  NONE DETECTED (Cut Off Level 300 ng/mL) Final   POC Cocaine UR 10/04/2021 None Detected  NONE DETECTED (Cut Off Level 300 ng/mL) Final   POC Methamphetamine UR 10/04/2021 None Detected  NONE DETECTED (Cut Off Level 1000 ng/mL) Final   POC Morphine 10/04/2021 None Detected  NONE DETECTED (Cut Off Level 300 ng/mL) Final   POC Methadone UR 10/04/2021 None Detected  NONE DETECTED (Cut Off Level 300 ng/mL) Final   POC Oxycodone UR 10/04/2021 Positive (A)  NONE DETECTED (Cut Off Level 100 ng/mL) Final   POC Marijuana UR 10/04/2021 None Detected  NONE DETECTED (Cut Off Level 50 ng/mL) Final   SARSCOV2ONAVIRUS 2 AG 10/04/2021 NEGATIVE  NEGATIVE Final   Comment: (NOTE) SARS-CoV-2 antigen NOT DETECTED.   Negative results are presumptive.  Negative results do not preclude SARS-CoV-2 infection and should not be used as the sole basis for treatment or other patient management decisions, including infection  control  decisions, particularly in the presence of clinical signs and  symptoms consistent with COVID-19, or in those who have been in contact with the virus.  Negative results must be combined with clinical observations, patient history, and epidemiological information. The expected result is Negative.  Fact Sheet for Patients: HandmadeRecipes.com.cy  Fact Sheet for Healthcare Providers: FuneralLife.at  This test is not yet approved or cleared by the [place] FDA and  has been authorized for detection and/or diagnosis of SARS-CoV-2 by FDA under an Emergency Use Authorization (EUA).  This EUA will remain in effect (meaning this test can be used) for the  duration of  the COV                          ID-19 declaration under Section 564(b)(1) of the Act, 21 U.S.C. section 360bbb-3(b)(1), unless the authorization is terminated or revoked sooner.     Valproic Acid Lvl 10/04/2021 51  50.0 - 100.0 ug/mL Final   Performed at [facility] [address], [place], [place] [address]   Valproic Acid Lvl 10/08/2021 57  50.0 - 100.0 ug/mL Final   Performed at [facility] [address], [place], [place] [address]   Glucose-Capillary 10/09/2021 123 (H)  70 - 99 mg/dL Final   Glucose reference range applies only to samples taken after fasting for at least 8 hours.  Admission on 09/09/2021, Discharged on 09/10/2021  Component Date Value Ref Range Status   Sodium 09/09/2021 134 (L)  135 - 145 mmol/L Final   Potassium 09/09/2021 4.3  3.5 - 5.1 mmol/L Final   Chloride 09/09/2021 99  98 - 111 mmol/L Final   CO2 09/09/2021 25  22 - 32 mmol/L Final   Glucose, Bld 09/09/2021 107 (H)  70 - 99 mg/dL Final   Glucose reference range applies only to samples taken after fasting for at least 8 hours.   BUN 09/09/2021 12  6 - 20 mg/dL Final   Creatinine, Ser 09/09/2021 0.74  0.44 - 1.00 mg/dL Final   Calcium 09/09/2021 9.0  8.9 - 10.3 mg/dL Final    Total Protein 09/09/2021 7.4  6.5 - 8.1 g/dL Final   Albumin 09/09/2021 3.1 (L)  3.5 - 5.0 g/dL Final   AST 09/09/2021 13 (L)  15 - 41 U/L Final   ALT 09/09/2021 13  0 - 44 U/L Final   Alkaline Phosphatase 09/09/2021 66  38 - 126 U/L Final   Total Bilirubin 09/09/2021 0.2 (L)  0.3 - 1.2 mg/dL Final   GFR, Estimated 09/09/2021 >60  >60 mL/min Final   Comment: (NOTE) Calculated using the CKD-EPI Creatinine Equation (2021)    Anion gap 09/09/2021 10  5 - 15 Final   Performed at [facility], [place] [address], [place], [place] [address]   WBC 09/09/2021 8.5  4.0 - 10.5 K/uL Final   RBC 09/09/2021 4.53  3.87 - 5.11 MIL/uL Final   Hemoglobin 09/09/2021 11.8 (L)  12.0 - 15.0 g/dL Final   HCT 09/09/2021 38.0  36.0 - 46.0 % Final   MCV 09/09/2021 83.9  80.0 - 100.0 fL Final   MCH 09/09/2021 26.0  26.0 - 34.0 pg Final   MCHC 09/09/2021 31.1  30.0 - 36.0 g/dL Final   RDW 09/09/2021 17.8 (H)  11.5 - 15.5 % Final   Platelets 09/09/2021 442 (H)  150 - 400 K/uL Final   nRBC 09/09/2021 0.0  0.0 - 0.2 % Final   Neutrophils Relative % 09/09/2021 70  % Final   Neutro Abs 09/09/2021 5.9  1.7 - 7.7 K/uL Final   Lymphocytes Relative 09/09/2021 20  % Final   Lymphs Abs 09/09/2021 1.7  0.7 - 4.0 K/uL Final   Monocytes Relative 09/09/2021 8  % Final   Monocytes Absolute 09/09/2021 0.7  0.1 - 1.0 K/uL Final   Eosinophils Relative 09/09/2021 1  % Final   Eosinophils Absolute 09/09/2021 0.1  0.0 - 0.5 K/uL Final   Basophils Relative 09/09/2021 1  % Final   Basophils Absolute 09/09/2021 0.1  0.0 - 0.1 K/uL Final  Immature Granulocytes 09/09/2021 0  % Final   Abs Immature Granulocytes 09/09/2021 0.03  0.00 - 0.07 K/uL Final   Performed at [facility] [address], [place], [place] [address]   Ammonia 09/09/2021 37 (H)  9 - 35 umol/L Final   Comment: HEMOLYSIS AT THIS LEVEL MAY AFFECT RESULT Performed at [facility], [place] [address], [place], [place] [address]    Opiates 09/09/2021 NONE  DETECTED  NONE DETECTED Final   Cocaine 09/09/2021 NONE DETECTED  NONE DETECTED Final   Benzodiazepines 09/09/2021 NONE DETECTED  NONE DETECTED Final   Amphetamines 09/09/2021 NONE DETECTED  NONE DETECTED Final   Tetrahydrocannabinol 09/09/2021 NONE DETECTED  NONE DETECTED Final   Barbiturates 09/09/2021 NONE DETECTED  NONE DETECTED Final   Comment: (NOTE) DRUG SCREEN FOR MEDICAL PURPOSES ONLY.  IF CONFIRMATION IS NEEDED FOR ANY PURPOSE, NOTIFY LAB WITHIN 5 DAYS.  LOWEST DETECTABLE LIMITS FOR URINE DRUG SCREEN Drug Class                     Cutoff (ng/mL) Amphetamine and metabolites    1000 Barbiturate and metabolites    200 Benzodiazepine                 671 Tricyclics and metabolites     300 Opiates and metabolites        300 Cocaine and metabolites        300 THC                            50 Performed at [facility], [place] [address] [place], [place] [address]    Alcohol, Ethyl (B) 09/09/2021 <10  <10 mg/dL Final   Comment: (NOTE) Lowest detectable limit for serum alcohol is 10 mg/dL.  For medical purposes only. Performed at [facility], [place] [address], [place], [place] [address]    Lipase 09/09/2021 33  11 - 51 U/L Final   Performed at [facility] [address], [place], [place] [address]   Color, Urine 09/09/2021 COLORLESS (A)  YELLOW Final   APPearance 09/09/2021 CLEAR  CLEAR Final   Specific Gravity, Urine 09/09/2021 1.002 (L)  1.005 - 1.030 Final   pH 09/09/2021 6.0  5.0 - 8.0 Final   Glucose, UA 09/09/2021 NEGATIVE  NEGATIVE mg/dL Final   Hgb urine dipstick 09/09/2021 NEGATIVE  NEGATIVE Final   Bilirubin Urine 09/09/2021 NEGATIVE  NEGATIVE Final   Ketones, ur 09/09/2021 NEGATIVE  NEGATIVE mg/dL Final   Protein, ur 09/09/2021 NEGATIVE  NEGATIVE mg/dL Final   Nitrite 09/09/2021 NEGATIVE  NEGATIVE Final   Leukocytes,Ua 09/09/2021 NEGATIVE  NEGATIVE Final   Performed at [facility] [address], [place], [place] [address]   Specimen  Description 09/09/2021 URINE, CLEAN CATCH   Final   Special Requests 09/09/2021 NONE   Final   Culture 09/09/2021  (A)   Final                   Value:<10,000 COLONIES/mL INSIGNIFICANT GROWTH Performed at [facility], [place] [address] [place], [place] [address]    Report Status 09/09/2021 09/10/2021 FINAL   Final   D-Dimer, Quant 09/09/2021 0.92 (H)  0.00 - 0.50 ug/mL-FEU Final   Comment: (NOTE) At the manufacturer cut-off value of 0.5 g/mL FEU, this assay has a negative predictive value of 95-100%.This assay is intended for use in conjunction  with a clinical pretest probability (PTP) assessment model to exclude pulmonary embolism (PE) and deep venous thrombosis (DVT) in outpatients suspected of PE or DVT. Results should be correlated with clinical presentation. Performed at [facility], [place] [address] [place], [place] [address]    Troponin I (High Sensitivity) 09/09/2021 5  <18 ng/L Final   Comment: (NOTE) Elevated high sensitivity troponin I (hsTnI) values and significant  changes across serial measurements may suggest ACS but many other  chronic and acute conditions are known to elevate hsTnI results.  Refer to the "Links" section for chest pain algorithms and additional  guidance. Performed at [facility], [place] [address] [place], [place] [address]    BP 09/10/2021 135/83  mmHg Final   S' Lateral 09/10/2021 2.30  cm Final   Area-P 1/2 09/10/2021 5.58  cm2 Final    Blood Alcohol level:  Lab Results  Component Value Date   ETH <10 11/23/2021   ETH <10 62/83/1517    Metabolic Disorder Labs: Lab Results  Component Value Date   HGBA1C 7.0 (H) 10/04/2021   MPG 154.2 10/04/2021   Lab Results  Component Value Date   PROLACTIN 3.8 (L) 11/23/2021   Lab Results  Component Value Date   CHOL 171 11/23/2021   TRIG 125 11/23/2021   HDL 65 11/23/2021   CHOLHDL 2.6 11/23/2021   VLDL 25 11/23/2021   LDLCALC 81 11/23/2021   LDLCALC 95 10/04/2021     Therapeutic Lab Levels: No results found for: "LITHIUM" Lab Results  Component Value Date   VALPROATE 45 (L) 11/09/2021   VALPROATE 57 10/08/2021   No results found for: "CBMZ"  Physical Findings   PHQ2-9    [facility] ED from 11/23/2021 in [facility]  PHQ-2 Total Score 2  PHQ-9 Total Score 3      Flowsheet Row ED from 11/23/2021 in [facility] Most recent reading at 11/23/2021  6:44 PM ED from 11/22/2021 in [facility] Most recent reading at 11/22/2021  8:28 PM ED from 11/22/2021 in [facility]PT Most recent reading at 11/22/2021  1:24 AM  C-SSRS RISK CATEGORY No Risk No Risk No Risk        Musculoskeletal  Strength & Muscle Tone: within normal limits Gait & Station: normal Patient leans: N/A  Psychiatric Specialty Exam  Presentation  General Appearance: Appropriate for Environment; Casual  Eye Contact:Good  Speech:Clear and Coherent; Normal Rate  Speech Volume:Normal  Handedness:Right   Mood and Affect  Mood:Euthymic  Affect:Appropriate; Congruent   Thought Process  Thought Processes:Coherent; Goal Directed; Linear  Descriptions of Associations:Intact  Orientation:Full (Time, Place and Person)  Thought Content:Logical; WDL  Diagnosis of Schizophrenia or Schizoaffective disorder in past: Yes  Duration of Psychotic Symptoms: No data recorded  Hallucinations:Hallucinations: None  Ideas of Reference:None  Suicidal Thoughts:Suicidal Thoughts: No  Homicidal Thoughts:Homicidal Thoughts: No   Sensorium  Memory:Immediate Good  Judgment:Impaired  Insight:Shallow   Executive Functions  Concentration:Good  Attention Span:Good  Recall:Good  Fund of Knowledge:Fair  Language:Fair   Psychomotor Activity  Psychomotor Activity:Psychomotor Activity: Normal   Assets  Assets:Communication Skills; Desire for  Improvement; Financial Resources/Insurance; Intimacy; Leisure Time; Physical Health; Resilience; Social Support   Sleep  Sleep:Sleep: Good   No data recorded  Physical Exam  Physical Exam Vitals and nursing note reviewed.  Constitutional:      Appearance: Normal appearance. She is well-developed.  HENT:  Head: Normocephalic and atraumatic.     Nose: Nose normal.  Cardiovascular:     Rate and Rhythm: Normal rate.  Pulmonary:     Effort: Pulmonary effort is normal.  Musculoskeletal:        General: Normal range of motion.     Cervical back: Normal range of motion.  Neurological:     Mental Status: She is alert and oriented to person, place, and time.  Psychiatric:        Attention and Perception: Attention and perception normal.        Mood and Affect: Mood and affect normal.        Speech: Speech normal.        Behavior: Behavior normal. Behavior is cooperative.        Thought Content: Thought content normal.    Review of Systems  Constitutional: Negative.   HENT: Negative.    Eyes: Negative.   Respiratory: Negative.    Cardiovascular: Negative.   Gastrointestinal: Negative.   Genitourinary: Negative.   Musculoskeletal: Negative.   Skin: Negative.   Neurological: Negative.   Psychiatric/Behavioral: Negative.     Blood pressure 127/77, pulse 82, temperature 97.8 F (36.6 C), temperature source Oral, resp. rate 18, SpO2 100 %. There is no height or weight on file to calculate BMI.  Treatment Plan Summary: Daily contact with patient to assess and evaluate symptoms and progress in treatment Patient reviewed with Dr Hampton Abbot. She remains cleared by psychiatry. TOC team continues to assist with safe discharge plan for [facility].   Lucky Rathke, FNP 11/26/2021 12:42 PM

## 2021-11-26 NOTE — ED Notes (Signed)
Pt. Sleeping. Resp even and unlabored. Will continue to monitor for safety.

## 2021-11-26 NOTE — ED Notes (Signed)
Pt c/o of left leg pain see Mar

## 2021-11-26 NOTE — ED Notes (Signed)
Pt sleeping in recliner. Awakened for assessment. A/O x 4 . Denies SI/HI, AVH. Resp even and unlabored. Will continue to monitor for safety

## 2021-11-26 NOTE — Progress Notes (Signed)
[facility] called APS SWK.  She reports she met with the client this morning at 10am but she has not had time to complete the full assessment and follow up with other parties that are involved.  She reports she has not spoke with the prior [place] or her case manager, Eddie Dibbles, at [employer]    [facility] spoke with the [place] owner last night.  She reports they will not accept her back.  They have been working on a discharge plan of care for [place] since June of 2023.  She reports [place] has been dx with schizophrenia, bipolar, and has IDD.    [facility] spoke with Eddie Dibbles at [employer].  He reports he last spoke with the police officers on Monday but no one since then.  [facility] informed him [facility] is for crisis emergencies only and most individuals do not stay past 3-5 days max.  [facility] explored with Eddie Dibbles what assistance he can provide to help locate her a higher level of care that provides 24 supervision.  Eddie Dibbles requested a copy of her clinical recommendations, CCA, and pertinent information be emailed to him at: [email or web address].  [facility] informed him DSS reports it could take up to 30 days to get guardianship and to get her placed.    Drema Balzarine  [facility]

## 2021-11-26 NOTE — ED Notes (Signed)
Pt accepted scheduled meds w/o difficulty. Applied Band-Aid to pt's foot after she applied ordered Bactroban cream. Pt stated she felt nervous today "because we did not get to go outside yesterday because it was raining". Pt currently resting on pul out chair/bed in no distress. Safety maintained and will continue to monitor.

## 2021-11-26 NOTE — ED Notes (Signed)
Pt sleeping@this time. Breathing even and unlabored. Will continue to monitor for safety 

## 2021-11-27 LAB — GLUCOSE, CAPILLARY
Glucose-Capillary: 133 mg/dL — ABNORMAL HIGH (ref 70–99)
Glucose-Capillary: 146 mg/dL — ABNORMAL HIGH (ref 70–99)
Glucose-Capillary: 144 mg/dL — ABNORMAL HIGH (ref 70–99)
Glucose-Capillary: 154 mg/dL — ABNORMAL HIGH (ref 70–99)

## 2021-11-27 MED ORDER — VITAMIN D (ERGOCALCIFEROL) 1.25 MG (50000 UNIT) PO CAPS
50000.0000 [IU] | ORAL_CAPSULE | ORAL | Status: DC
  Administered 2021-12-03 – 2022-01-21 (×8): 50000 [IU] via ORAL
  Filled 2021-11-27 (×8): qty 1

## 2021-11-27 MED FILL — Ergocalciferol Cap 1.25 MG (50000 Unit): ORAL | Qty: 1 | Status: AC

## 2021-11-27 NOTE — ED Notes (Signed)
Pt sleeping at present, no distress noted.  Monitoring for safety. 

## 2021-11-27 NOTE — ED Provider Notes (Signed)
Behavioral Health Progress Note  Date and Time: 11/27/2021 10:31 AM Name: Teresa Murillo MRN:  [id number]  Subjective: Patient states "do you know where I am going?"  Teresa Murillo is reassessed, face-to-face, by nurse practitioner.  She is seated in observation area upon my approach, eating breakfast.  She is alert and oriented, pleasant and cooperative during assessment.  She presents with euthymic mood, congruent affect.  She continues to deny suicidal and homicidal ideations.  She easily contracts verbally for safety with this Probation officer.  She denies auditory and visual hallucinations.  There is no evidence of delusional thought content and no indication that patient is responding to internal stimuli.  Teresa Murillo states "I am okay here for now."  She endorses average sleep and appetite, she is compliant with medications.  Patient offered support and encouragement.  Reviewed current treatment plan to include search for safe discharge and housing.  Patient verbalizes understanding of treatment plan.  Diagnosis:  Final diagnoses:  At risk for self care deficit  Noncompliance  Self-care deficit for medication administration  Schizophrenia, unspecified type ([place])    Total Time spent with patient: 30 minutes  Past Psychiatric History: Schizoaffective disorder, Schizophrenia, aggressive behavior Past Medical History:  Past Medical History:  Diagnosis Date   Chronic obstructive pulmonary disease (COPD) ([place])    Glaucoma    Hyperlipidemia    Hypertension    Iron deficiency    Schizoaffective disorder ([place])    Type 2 diabetes mellitus ([place])     Past Surgical History:  Procedure Laterality Date   TUBAL LIGATION     Family History:  Family History  Problem Relation Age of Onset   Breast cancer Maternal Grandmother    Family Psychiatric  History: None reported Social History:  Social History   Substance and Sexual Activity  Alcohol Use Yes     Social History   Substance and Sexual  Activity  Drug Use Not Currently    Social History   Socioeconomic History   Marital status: Divorced    Spouse name: Not on file   Number of children: Not on file   Years of education: Not on file   Highest education level: Not on file  Occupational History   Not on file  Tobacco Use   Smoking status: Every Day    Packs/day: 1.00    Types: Cigars, Cigarettes   Smokeless tobacco: Current  Vaping Use   Vaping Use: Never used  Substance and Sexual Activity   Alcohol use: Yes   Drug use: Not Currently   Sexual activity: Not Currently    Birth control/protection: Surgical  Other Topics Concern   Not on file  Social History Narrative   Not on file   Social Determinants of Health   Financial Resource Strain: Not on file  Food Insecurity: Not on file  Transportation Needs: Not on file  Physical Activity: Not on file  Stress: Not on file  Social Connections: Not on file   SDOH:  SDOH Screenings   Depression (PHQ2-9): Low Risk  (11/23/2021)  Tobacco Use: High Risk (11/23/2021)   Additional Social History:    Pain Medications: See MAR Prescriptions: See MAR Over the Counter: See MAR History of alcohol / drug use?: No history of alcohol / drug abuse Longest period of sobriety (when/how long): N/A                    Sleep: Good  Appetite:  Good  Current Medications:  Current Facility-Administered Medications  Medication Dose Route Frequency Provider Last Rate Last Admin   acetaminophen (TYLENOL) tablet 650 mg  650 mg Oral Q6H PRN Rankin, Shuvon B, NP   650 mg at 11/26/21 0132   albuterol (VENTOLIN HFA) 108 (90 Base) MCG/ACT inhaler 2 puff  2 puff Inhalation Q6H PRN Rankin, Shuvon B, NP       alum & mag hydroxide-simeth (MAALOX/MYLANTA) 200-200-20 MG/5ML suspension 30 mL  30 mL Oral Q4H PRN Rankin, Shuvon B, NP       apixaban (ELIQUIS) tablet 5 mg  5 mg Oral BID Rankin, Shuvon B, NP   5 mg at 11/27/21 1011   cloZAPine (CLOZARIL) tablet 50 mg  50 mg Oral Daily  [name], NP   50 mg at 11/27/21 1013   cloZAPine (CLOZARIL) tablet 75 mg  75 mg Oral QHS [name], NP   75 mg at 11/26/21 2122   diltiazem (CARDIZEM CD) 24 hr capsule 240 mg  240 mg Oral Daily Rankin, Shuvon B, NP   240 mg at 11/27/21 1012   divalproex (DEPAKOTE ER) 24 hr tablet 500 mg  500 mg Oral BID Rankin, Shuvon B, NP   500 mg at 11/27/21 1013   fluticasone furoate-vilanterol (BREO ELLIPTA) 200-25 MCG/ACT 1 puff  1 puff Inhalation Daily Rankin, Shuvon B, NP   1 puff at 11/27/21 1016   haloperidol (HALDOL) tablet 10 mg  10 mg Oral Q8H PRN Rankin, Shuvon B, NP       insulin aspart (novoLOG) injection 0-15 Units  0-15 Units Subcutaneous TID WC Rankin, Shuvon B, NP   2 Units at 11/27/21 0813   insulin glargine-yfgn (SEMGLEE) injection 12 Units  12 Units Subcutaneous Daily Rankin, Shuvon B, NP   12 Units at 11/27/21 1009   latanoprost (XALATAN) 0.005 % ophthalmic solution 1 drop  1 drop Both Eyes QHS Rankin, Shuvon B, NP   1 drop at 11/26/21 2124   magnesium hydroxide (MILK OF MAGNESIA) suspension 30 mL  30 mL Oral Daily PRN Rankin, Shuvon B, NP       metFORMIN (GLUCOPHAGE) tablet 500 mg  500 mg Oral BID WC Rankin, Shuvon B, NP   500 mg at 11/27/21 0818   mupirocin ointment (BACTROBAN) 2 % 1 Application  1 Application Topical TID Rankin, Shuvon B, NP   1 Application at 58/83/25 2123   nicotine (NICODERM CQ - dosed in mg/24 hr) patch 7 mg  7 mg Transdermal Daily Rankin, Shuvon B, NP   7 mg at 11/27/21 1014   Vitamin D3 CAPS 50,000 Units  50,000 Units Oral Q Thu Rankin, Shuvon B, NP   50,000 Units at 11/26/21 4982   Current Outpatient Medications  Medication Sig Dispense Refill   Accu-Chek Softclix Lancets lancets Use as directed up to four times daily 100 each 0   apixaban (ELIQUIS) 5 MG TABS tablet Take 1 tablet (5 mg total) by mouth 2 (two) times daily. 60 tablet 0   ARIPiprazole (ABILIFY) 10 MG tablet Take 1 tablet (10 mg total) by mouth daily. 30 tablet 0   Blood Glucose Monitoring  Suppl (ACCU-CHEK GUIDE) w/Device KIT Use as directed up to four times daily 1 kit 0   budesonide-formoterol (SYMBICORT) 160-4.5 MCG/ACT inhaler Inhale 2 puffs into the lungs in the morning and at bedtime.     Cholecalciferol (VITAMIN D3) 1.25 MG (50000 UT) CAPS Take 50,000 Units by mouth every Thursday.     cloZAPine (CLOZARIL) 25 MG tablet Take 3 tablets (75 mg total) by mouth  at bedtime. 90 tablet 0   clozapine (CLOZARIL) 50 MG tablet Take 1 tablet (50 mg total) by mouth daily. 30 tablet 0   diltiazem (CARDIZEM CD) 240 MG 24 hr capsule Take 1 capsule (240 mg total) by mouth daily. (Patient not taking: Reported on 11/24/2021) 30 capsule 0   divalproex (DEPAKOTE ER) 500 MG 24 hr tablet Take 1 tablet (500 mg total) by mouth 2 (two) times daily. 60 tablet 0   glucose blood test strip Use as directed up to four times daily 50 each 0   haloperidol (HALDOL) 10 MG tablet Take 1 tablet (10 mg total) by mouth 3 (three) times daily as needed for agitation (and psychotic symptoms).     INGREZZA 40 MG capsule Take 1 capsule (40 mg total) by mouth in the morning. 30 capsule 0   insulin aspart (NOVOLOG) 100 UNIT/ML FlexPen Before each meal 3 times a day, 140-199 - 2 units, 200-250 - 4 units, 251-299 - 6 units,  300-349 - 8 units,  350 or above 10 units. Insulin PEN if approved, provide syringes and needles if needed.Please switch to any approved short acting Insulin if needed. 15 mL 0   insulin glargine (LANTUS) 100 UNIT/ML Solostar Pen Inject 12 Units into the skin daily. 15 mL 0   Insulin Pen Needle 32G X 4 MM MISC Use 4 times a day with insulin, 1 month supply. 100 each 0   latanoprost (XALATAN) 0.005 % ophthalmic solution Place 1 drop into both eyes at bedtime.     metFORMIN (GLUCOPHAGE) 500 MG tablet Take 500 mg by mouth 2 (two) times daily with a meal.     nicotine (NICODERM CQ - DOSED IN MG/24 HR) 7 mg/24hr patch Place 1 patch (7 mg total) onto the skin daily. 28 patch 0   PROAIR HFA 108 (90 Base) MCG/ACT  inhaler Inhale 2 puffs into the lungs every 6 (six) hours as needed for wheezing or shortness of breath.      Labs  Lab Results:  Admission on 11/23/2021  Component Date Value Ref Range Status   SARS Coronavirus 2 by RT PCR 11/23/2021 NEGATIVE  NEGATIVE Final   Comment: (NOTE) SARS-CoV-2 target nucleic acids are NOT DETECTED.  The SARS-CoV-2 RNA is generally detectable in upper respiratory specimens during the acute phase of infection. The lowest concentration of SARS-CoV-2 viral copies this assay can detect is 138 copies/mL. A negative result does not preclude SARS-Cov-2 infection and should not be used as the sole basis for treatment or other patient management decisions. A negative result may occur with  improper specimen collection/handling, submission of specimen other than nasopharyngeal swab, presence of viral mutation(s) within the areas targeted by this assay, and inadequate number of viral copies(<138 copies/mL). A negative result must be combined with clinical observations, patient history, and epidemiological information. The expected result is Negative.  Fact Sheet for Patients:  EntrepreneurPulse.com.au  Fact Sheet for Healthcare Providers:  IncredibleEmployment.be  This test is no                          t yet approved or cleared by the [place] FDA and  has been authorized for detection and/or diagnosis of SARS-CoV-2 by FDA under an Emergency Use Authorization (EUA). This EUA will remain  in effect (meaning this test can be used) for the duration of the COVID-19 declaration under Section 564(b)(1) of the Act, 21 U.S.C.section 360bbb-3(b)(1), unless the authorization is terminated  or  revoked sooner.       Influenza A by PCR 11/23/2021 NEGATIVE  NEGATIVE Final   Influenza B by PCR 11/23/2021 NEGATIVE  NEGATIVE Final   Comment: (NOTE) The Xpert Xpress SARS-CoV-2/FLU/RSV plus assay is intended as an aid in the  diagnosis of influenza from Nasopharyngeal swab specimens and should not be used as a sole basis for treatment. Nasal washings and aspirates are unacceptable for Xpert Xpress SARS-CoV-2/FLU/RSV testing.  Fact Sheet for Patients: EntrepreneurPulse.com.au  Fact Sheet for Healthcare Providers: IncredibleEmployment.be  This test is not yet approved or cleared by the [place] FDA and has been authorized for detection and/or diagnosis of SARS-CoV-2 by FDA under an Emergency Use Authorization (EUA). This EUA will remain in effect (meaning this test can be used) for the duration of the COVID-19 declaration under Section 564(b)(1) of the Act, 21 U.S.C. section 360bbb-3(b)(1), unless the authorization is terminated or revoked.  Performed at [facility], [place] [address] [place], [place] [address]    WBC 11/23/2021 7.4  4.0 - 10.5 K/uL Final   RBC 11/23/2021 4.25  3.87 - 5.11 MIL/uL Final   Hemoglobin 11/23/2021 11.1 (L)  12.0 - 15.0 g/dL Final   HCT 11/23/2021 34.6 (L)  36.0 - 46.0 % Final   MCV 11/23/2021 81.4  80.0 - 100.0 fL Final   MCH 11/23/2021 26.1  26.0 - 34.0 pg Final   MCHC 11/23/2021 32.1  30.0 - 36.0 g/dL Final   RDW 11/23/2021 17.7 (H)  11.5 - 15.5 % Final   Platelets 11/23/2021 365  150 - 400 K/uL Final   nRBC 11/23/2021 0.0  0.0 - 0.2 % Final   Neutrophils Relative % 11/23/2021 59  % Final   Neutro Abs 11/23/2021 4.3  1.7 - 7.7 K/uL Final   Lymphocytes Relative 11/23/2021 31  % Final   Lymphs Abs 11/23/2021 2.3  0.7 - 4.0 K/uL Final   Monocytes Relative 11/23/2021 7  % Final   Monocytes Absolute 11/23/2021 0.5  0.1 - 1.0 K/uL Final   Eosinophils Relative 11/23/2021 2  % Final   Eosinophils Absolute 11/23/2021 0.1  0.0 - 0.5 K/uL Final   Basophils Relative 11/23/2021 1  % Final   Basophils Absolute 11/23/2021 0.1  0.0 - 0.1 K/uL Final   Immature Granulocytes 11/23/2021 0  % Final   Abs Immature Granulocytes 11/23/2021 0.01   0.00 - 0.07 K/uL Final   Performed at [facility], [place] [address], [place], [place] [address]   Sodium 11/23/2021 139  135 - 145 mmol/L Final   Potassium 11/23/2021 4.1  3.5 - 5.1 mmol/L Final   Chloride 11/23/2021 105  98 - 111 mmol/L Final   CO2 11/23/2021 26  22 - 32 mmol/L Final   Glucose, Bld 11/23/2021 101 (H)  70 - 99 mg/dL Final   Glucose reference range applies only to samples taken after fasting for at least 8 hours.   BUN 11/23/2021 5 (L)  6 - 20 mg/dL Final   Creatinine, Ser 11/23/2021 0.51  0.44 - 1.00 mg/dL Final   Calcium 11/23/2021 9.0  8.9 - 10.3 mg/dL Final   Total Protein 11/23/2021 6.7  6.5 - 8.1 g/dL Final   Albumin 11/23/2021 3.6  3.5 - 5.0 g/dL Final   AST 11/23/2021 24  15 - 41 U/L Final   ALT 11/23/2021 18  0 - 44 U/L Final   Alkaline Phosphatase 11/23/2021 62  38 - 126 U/L Final   Total Bilirubin 11/23/2021 0.4  0.3 - 1.2 mg/dL Final   GFR, Estimated 11/23/2021 >60  >60 mL/min Final   Comment: (NOTE) Calculated using the CKD-EPI Creatinine Equation (2021)    Anion gap 11/23/2021 8  5 - 15 Final   Performed at [facility], [place] [address] [place], [place] [address]   Magnesium 11/23/2021 2.0  1.7 - 2.4 mg/dL Final   Performed at [facility] [address], [place], [place] [address]   Alcohol, Ethyl (B) 11/23/2021 <10  <10 mg/dL Final   Comment: (NOTE) Lowest detectable limit for serum alcohol is 10 mg/dL.  For medical purposes only. Performed at [facility], [place] [address], [place],  [address]    Prolactin 11/23/2021 3.8 (L)  4.8 - 23.3 ng/mL Final   Comment: (NOTE) Performed At: [facility] [place], [place] [id number] Rush Farmer MD OQ:[id number]    Color, Urine 11/23/2021 YELLOW  YELLOW Final   APPearance 11/23/2021 CLEAR  CLEAR Final   Specific Gravity, Urine 11/23/2021 1.025  1.005 - 1.030 Final   pH 11/23/2021 6.5  5.0 - 8.0 Final   Glucose, UA 11/23/2021 NEGATIVE  NEGATIVE mg/dL  Final   Hgb urine dipstick 11/23/2021 NEGATIVE  NEGATIVE Final   Bilirubin Urine 11/23/2021 NEGATIVE  NEGATIVE Final   Ketones, ur 11/23/2021 NEGATIVE  NEGATIVE mg/dL Final   Protein, ur 11/23/2021 NEGATIVE  NEGATIVE mg/dL Final   Nitrite 11/23/2021 NEGATIVE  NEGATIVE Final   Leukocytes,Ua 11/23/2021 NEGATIVE  NEGATIVE Final   Microscopic not done on urines with negative protein, blood, leukocytes, nitrite, or glucose < 500 mg/dL.   RBC / HPF 11/23/2021 0-5  0 - 5 RBC/hpf Final   WBC, UA 11/23/2021 0-5  0 - 5 WBC/hpf Final   Bacteria, UA 11/23/2021 NONE SEEN  NONE SEEN Final   Squamous Epithelial / LPF 11/23/2021 0-5  0 - 5 Final   Mucus 11/23/2021 PRESENT   Final   Performed at [facility], [place] [address] [place], [place] [address]   POC Amphetamine UR 11/23/2021 None Detected  NONE DETECTED (Cut Off Level 1000 ng/mL) Final   POC Secobarbital (BAR) 11/23/2021 None Detected  NONE DETECTED (Cut Off Level 300 ng/mL) Final   POC Buprenorphine (BUP) 11/23/2021 None Detected  NONE DETECTED (Cut Off Level 10 ng/mL) Final   POC Oxazepam (BZO) 11/23/2021 None Detected  NONE DETECTED (Cut Off Level 300 ng/mL) Final   POC Cocaine UR 11/23/2021 None Detected  NONE DETECTED (Cut Off Level 300 ng/mL) Final   POC Methamphetamine UR 11/23/2021 None Detected  NONE DETECTED (Cut Off Level 1000 ng/mL) Final   POC Morphine 11/23/2021 None Detected  NONE DETECTED (Cut Off Level 300 ng/mL) Final   POC Methadone UR 11/23/2021 None Detected  NONE DETECTED (Cut Off Level 300 ng/mL) Final   POC Oxycodone UR 11/23/2021 None Detected  NONE DETECTED (Cut Off Level 100 ng/mL) Final   POC Marijuana UR 11/23/2021 None Detected  NONE DETECTED (Cut Off Level 50 ng/mL) Final   SARSCOV2ONAVIRUS 2 AG 11/23/2021 NEGATIVE  NEGATIVE Final   Comment: (NOTE) SARS-CoV-2 antigen NOT DETECTED.   Negative results are presumptive.  Negative results do not preclude SARS-CoV-2 infection and should not be used as the sole basis  for treatment or other patient management decisions, including infection  control decisions, particularly in the presence of clinical signs and  symptoms consistent with COVID-19, or in those who have been in contact with the virus.  Negative results must  be combined with clinical observations, patient history, and epidemiological information. The expected result is Negative.  Fact Sheet for Patients: HandmadeRecipes.com.cy  Fact Sheet for Healthcare Providers: FuneralLife.at  This test is not yet approved or cleared by the [place] FDA and  has been authorized for detection and/or diagnosis of SARS-CoV-2 by FDA under an Emergency Use Authorization (EUA).  This EUA will remain in effect (meaning this test can be used) for the duration of  the COV                          ID-19 declaration under Section 564(b)(1) of the Act, 21 U.S.C. section 360bbb-3(b)(1), unless the authorization is terminated or revoked sooner.     Cholesterol 11/23/2021 171  0 - 200 mg/dL Final   Triglycerides 11/23/2021 125  <150 mg/dL Final   HDL 11/23/2021 65  >40 mg/dL Final   Total CHOL/HDL Ratio 11/23/2021 2.6  RATIO Final   VLDL 11/23/2021 25  0 - 40 mg/dL Final   LDL Cholesterol 11/23/2021 81  0 - 99 mg/dL Final   Comment:        Total Cholesterol/HDL:CHD Risk Coronary Heart Disease Risk Table                     Men   Women  1/2 Average Risk   3.4   3.3  Average Risk       5.0   4.4  2 X Average Risk   9.6   7.1  3 X Average Risk  23.4   11.0        Use the calculated Patient Ratio above and the CHD Risk Table to determine the patient's CHD Risk.        ATP III CLASSIFICATION (LDL):  <100     mg/dL   Optimal  100-129  mg/dL   Near or Above                    Optimal  130-159  mg/dL   Borderline  160-189  mg/dL   High  >190     mg/dL   Very High Performed at [facility] [address], [place], [place] [address]    TSH 11/23/2021  1.788  0.350 - 4.500 uIU/mL Final   Comment: Performed by a 3rd Generation assay with a functional sensitivity of <=0.01 uIU/mL. Performed at [facility], [place] [address], [place], [place] [address]    Glucose-Capillary 11/23/2021 137 (H)  70 - 99 mg/dL Final   Glucose reference range applies only to samples taken after fasting for at least 8 hours.   Glucose-Capillary 11/24/2021 144 (H)  70 - 99 mg/dL Final   Glucose reference range applies only to samples taken after fasting for at least 8 hours.   Glucose-Capillary 11/24/2021 87  70 - 99 mg/dL Final   Glucose reference range applies only to samples taken after fasting for at least 8 hours.   Glucose-Capillary 11/24/2021 269 (H)  70 - 99 mg/dL Final   Glucose reference range applies only to samples taken after fasting for at least 8 hours.   Glucose-Capillary 11/25/2021 112 (H)  70 - 99 mg/dL Final   Glucose reference range applies only to samples taken after fasting for at least 8 hours.   Glucose-Capillary 11/25/2021 136 (H)  70 - 99 mg/dL Final   Glucose reference range applies only to samples taken after fasting for  at least 8 hours.   Glucose-Capillary 11/25/2021 187 (H)  70 - 99 mg/dL Final   Glucose reference range applies only to samples taken after fasting for at least 8 hours.   Glucose-Capillary 11/25/2021 159 (H)  70 - 99 mg/dL Final   Glucose reference range applies only to samples taken after fasting for at least 8 hours.   Glucose-Capillary 11/26/2021 122 (H)  70 - 99 mg/dL Final   Glucose reference range applies only to samples taken after fasting for at least 8 hours.   Glucose-Capillary 11/26/2021 140 (H)  70 - 99 mg/dL Final   Glucose reference range applies only to samples taken after fasting for at least 8 hours.   Glucose-Capillary 11/26/2021 102 (H)  70 - 99 mg/dL Final   Glucose reference range applies only to samples taken after fasting for at least 8 hours.   Glucose-Capillary 11/26/2021 166 (H)  70 - 99  mg/dL Final   Glucose reference range applies only to samples taken after fasting for at least 8 hours.   Glucose-Capillary 11/27/2021 133 (H)  70 - 99 mg/dL Final   Glucose reference range applies only to samples taken after fasting for at least 8 hours.  Admission on 11/22/2021, Discharged on 11/22/2021  Component Date Value Ref Range Status   Glucose-Capillary 11/22/2021 169 (H)  70 - 99 mg/dL Final   Glucose reference range applies only to samples taken after fasting for at least 8 hours.  No results displayed because visit has over 200 results.    Admission on 10/04/2021, Discharged on 10/22/2021  Component Date Value Ref Range Status   SARS Coronavirus 2 by RT PCR 10/04/2021 NEGATIVE  NEGATIVE Final   Comment: (NOTE) SARS-CoV-2 target nucleic acids are NOT DETECTED.  The SARS-CoV-2 RNA is generally detectable in upper respiratory specimens during the acute phase of infection. The lowest concentration of SARS-CoV-2 viral copies this assay can detect is 138 copies/mL. A negative result does not preclude SARS-Cov-2 infection and should not be used as the sole basis for treatment or other patient management decisions. A negative result may occur with  improper specimen collection/handling, submission of specimen other than nasopharyngeal swab, presence of viral mutation(s) within the areas targeted by this assay, and inadequate number of viral copies(<138 copies/mL). A negative result must be combined with clinical observations, patient history, and epidemiological information. The expected result is Negative.  Fact Sheet for Patients:  EntrepreneurPulse.com.au  Fact Sheet for Healthcare Providers:  IncredibleEmployment.be  This test is no                          t yet approved or cleared by the [place] FDA and  has been authorized for detection and/or diagnosis of SARS-CoV-2 by FDA under an Emergency Use Authorization (EUA). This EUA  will remain  in effect (meaning this test can be used) for the duration of the COVID-19 declaration under Section 564(b)(1) of the Act, 21 U.S.C.section 360bbb-3(b)(1), unless the authorization is terminated  or revoked sooner.       Influenza A by PCR 10/04/2021 NEGATIVE  NEGATIVE Final   Influenza B by PCR 10/04/2021 NEGATIVE  NEGATIVE Final   Comment: (NOTE) The Xpert Xpress SARS-CoV-2/FLU/RSV plus assay is intended as an aid in the diagnosis of influenza from Nasopharyngeal swab specimens and should not be used as a sole basis for treatment. Nasal washings and aspirates are unacceptable for Xpert Xpress SARS-CoV-2/FLU/RSV testing.  Fact Sheet for Patients:  EntrepreneurPulse.com.au  Fact Sheet for Healthcare Providers: IncredibleEmployment.be  This test is not yet approved or cleared by the [place] FDA and has been authorized for detection and/or diagnosis of SARS-CoV-2 by FDA under an Emergency Use Authorization (EUA). This EUA will remain in effect (meaning this test can be used) for the duration of the COVID-19 declaration under Section 564(b)(1) of the Act, 21 U.S.C. section 360bbb-3(b)(1), unless the authorization is terminated or revoked.  Performed at [facility], [place] [address], [place], [place] [address]    WBC 10/04/2021 8.4  4.0 - 10.5 K/uL Final   RBC 10/04/2021 4.42  3.87 - 5.11 MIL/uL Final   Hemoglobin 10/04/2021 11.6 (L)  12.0 - 15.0 g/dL Final   HCT 10/04/2021 35.7 (L)  36.0 - 46.0 % Final   MCV 10/04/2021 80.8  80.0 - 100.0 fL Final   MCH 10/04/2021 26.2  26.0 - 34.0 pg Final   MCHC 10/04/2021 32.5  30.0 - 36.0 g/dL Final   RDW 10/04/2021 16.0 (H)  11.5 - 15.5 % Final   Platelets 10/04/2021 372  150 - 400 K/uL Final   nRBC 10/04/2021 0.0  0.0 - 0.2 % Final   Neutrophils Relative % 10/04/2021 68  % Final   Neutro Abs 10/04/2021 5.7  1.7 - 7.7 K/uL Final   Lymphocytes Relative 10/04/2021 22  % Final    Lymphs Abs 10/04/2021 1.8  0.7 - 4.0 K/uL Final   Monocytes Relative 10/04/2021 8  % Final   Monocytes Absolute 10/04/2021 0.7  0.1 - 1.0 K/uL Final   Eosinophils Relative 10/04/2021 1  % Final   Eosinophils Absolute 10/04/2021 0.1  0.0 - 0.5 K/uL Final   Basophils Relative 10/04/2021 1  % Final   Basophils Absolute 10/04/2021 0.1  0.0 - 0.1 K/uL Final   Immature Granulocytes 10/04/2021 0  % Final   Abs Immature Granulocytes 10/04/2021 0.03  0.00 - 0.07 K/uL Final   Performed at [facility], [place] [address] [place], [place] [address]   Sodium 10/04/2021 136  135 - 145 mmol/L Final   Potassium 10/04/2021 4.2  3.5 - 5.1 mmol/L Final   Chloride 10/04/2021 104  98 - 111 mmol/L Final   CO2 10/04/2021 25  22 - 32 mmol/L Final   Glucose, Bld 10/04/2021 100 (H)  70 - 99 mg/dL Final   Glucose reference range applies only to samples taken after fasting for at least 8 hours.   BUN 10/04/2021 9  6 - 20 mg/dL Final   Creatinine, Ser 10/04/2021 0.52  0.44 - 1.00 mg/dL Final   Calcium 10/04/2021 9.0  8.9 - 10.3 mg/dL Final   Total Protein 10/04/2021 7.0  6.5 - 8.1 g/dL Final   Albumin 10/04/2021 3.2 (L)  3.5 - 5.0 g/dL Final   AST 10/04/2021 13 (L)  15 - 41 U/L Final   ALT 10/04/2021 10  0 - 44 U/L Final   Alkaline Phosphatase 10/04/2021 61  38 - 126 U/L Final   Total Bilirubin 10/04/2021 0.3  0.3 - 1.2 mg/dL Final   GFR, Estimated 10/04/2021 >60  >60 mL/min Final   Comment: (NOTE) Calculated using the CKD-EPI Creatinine Equation (2021)    Anion gap 10/04/2021 7  5 - 15 Final   Performed at [facility] [address] [place], [place] [address]   Hgb A1c MFr Bld 10/04/2021 7.0 (H)  4.8 - 5.6 % Final   Comment: (NOTE) Pre diabetes:  5.7%-6.4%  Diabetes:              >6.4%  Glycemic control for   <7.0% adults with diabetes    Mean Plasma Glucose 10/04/2021 154.2  mg/dL Final   Performed at [facility] [address] [place], [place] [address]   Cholesterol  10/04/2021 178  0 - 200 mg/dL Final   Triglycerides 10/04/2021 155 (H)  <150 mg/dL Final   HDL 10/04/2021 52  >40 mg/dL Final   Total CHOL/HDL Ratio 10/04/2021 3.4  RATIO Final   VLDL 10/04/2021 31  0 - 40 mg/dL Final   LDL Cholesterol 10/04/2021 95  0 - 99 mg/dL Final   Comment:        Total Cholesterol/HDL:CHD Risk Coronary Heart Disease Risk Table                     Men   Women  1/2 Average Risk   3.4   3.3  Average Risk       5.0   4.4  2 X Average Risk   9.6   7.1  3 X Average Risk  23.4   11.0        Use the calculated Patient Ratio above and the CHD Risk Table to determine the patient's CHD Risk.        ATP III CLASSIFICATION (LDL):  <100     mg/dL   Optimal  100-129  mg/dL   Near or Above                    Optimal  130-159  mg/dL   Borderline  160-189  mg/dL   High  >190     mg/dL   Very High Performed at [facility] [address], [place], [place] [address]    POC Amphetamine UR 10/04/2021 None Detected  NONE DETECTED (Cut Off Level 1000 ng/mL) Final   POC Secobarbital (BAR) 10/04/2021 None Detected  NONE DETECTED (Cut Off Level 300 ng/mL) Final   POC Buprenorphine (BUP) 10/04/2021 None Detected  NONE DETECTED (Cut Off Level 10 ng/mL) Final   POC Oxazepam (BZO) 10/04/2021 None Detected  NONE DETECTED (Cut Off Level 300 ng/mL) Final   POC Cocaine UR 10/04/2021 None Detected  NONE DETECTED (Cut Off Level 300 ng/mL) Final   POC Methamphetamine UR 10/04/2021 None Detected  NONE DETECTED (Cut Off Level 1000 ng/mL) Final   POC Morphine 10/04/2021 None Detected  NONE DETECTED (Cut Off Level 300 ng/mL) Final   POC Methadone UR 10/04/2021 None Detected  NONE DETECTED (Cut Off Level 300 ng/mL) Final   POC Oxycodone UR 10/04/2021 Positive (A)  NONE DETECTED (Cut Off Level 100 ng/mL) Final   POC Marijuana UR 10/04/2021 None Detected  NONE DETECTED (Cut Off Level 50 ng/mL) Final   SARSCOV2ONAVIRUS 2 AG 10/04/2021 NEGATIVE  NEGATIVE Final   Comment: (NOTE) SARS-CoV-2  antigen NOT DETECTED.   Negative results are presumptive.  Negative results do not preclude SARS-CoV-2 infection and should not be used as the sole basis for treatment or other patient management decisions, including infection  control decisions, particularly in the presence of clinical signs and  symptoms consistent with COVID-19, or in those who have been in contact with the virus.  Negative results must be combined with clinical observations, patient history, and epidemiological information. The expected result is Negative.  Fact Sheet for Patients: HandmadeRecipes.com.cy  Fact Sheet for Healthcare Providers: FuneralLife.at  This test is not  yet approved or cleared by the [place] and  has been authorized for detection and/or diagnosis of SARS-CoV-2 by FDA under an Emergency Use Authorization (EUA).  This EUA will remain in effect (meaning this test can be used) for the duration of  the COV                          ID-19 declaration under Section 564(b)(1) of the Act, 21 U.S.C. section 360bbb-3(b)(1), unless the authorization is terminated or revoked sooner.     Valproic Acid Lvl 10/04/2021 51  50.0 - 100.0 ug/mL Final   Performed at [facility] [address], [place], [place] [address]   Valproic Acid Lvl 10/08/2021 57  50.0 - 100.0 ug/mL Final   Performed at [facility] [address], [place], [place] [address]   Glucose-Capillary 10/09/2021 123 (H)  70 - 99 mg/dL Final   Glucose reference range applies only to samples taken after fasting for at least 8 hours.  Admission on 09/09/2021, Discharged on 09/10/2021  Component Date Value Ref Range Status   Sodium 09/09/2021 134 (L)  135 - 145 mmol/L Final   Potassium 09/09/2021 4.3  3.5 - 5.1 mmol/L Final   Chloride 09/09/2021 99  98 - 111 mmol/L Final   CO2 09/09/2021 25  22 - 32 mmol/L Final   Glucose, Bld 09/09/2021 107 (H)  70 - 99 mg/dL Final   Glucose  reference range applies only to samples taken after fasting for at least 8 hours.   BUN 09/09/2021 12  6 - 20 mg/dL Final   Creatinine, Ser 09/09/2021 0.74  0.44 - 1.00 mg/dL Final   Calcium 09/09/2021 9.0  8.9 - 10.3 mg/dL Final   Total Protein 09/09/2021 7.4  6.5 - 8.1 g/dL Final   Albumin 09/09/2021 3.1 (L)  3.5 - 5.0 g/dL Final   AST 09/09/2021 13 (L)  15 - 41 U/L Final   ALT 09/09/2021 13  0 - 44 U/L Final   Alkaline Phosphatase 09/09/2021 66  38 - 126 U/L Final   Total Bilirubin 09/09/2021 0.2 (L)  0.3 - 1.2 mg/dL Final   GFR, Estimated 09/09/2021 >60  >60 mL/min Final   Comment: (NOTE) Calculated using the CKD-EPI Creatinine Equation (2021)    Anion gap 09/09/2021 10  5 - 15 Final   Performed at [facility], [place] [address], [place], [place] [address]   WBC 09/09/2021 8.5  4.0 - 10.5 K/uL Final   RBC 09/09/2021 4.53  3.87 - 5.11 MIL/uL Final   Hemoglobin 09/09/2021 11.8 (L)  12.0 - 15.0 g/dL Final   HCT 09/09/2021 38.0  36.0 - 46.0 % Final   MCV 09/09/2021 83.9  80.0 - 100.0 fL Final   MCH 09/09/2021 26.0  26.0 - 34.0 pg Final   MCHC 09/09/2021 31.1  30.0 - 36.0 g/dL Final   RDW 09/09/2021 17.8 (H)  11.5 - 15.5 % Final   Platelets 09/09/2021 442 (H)  150 - 400 K/uL Final   nRBC 09/09/2021 0.0  0.0 - 0.2 % Final   Neutrophils Relative % 09/09/2021 70  % Final   Neutro Abs 09/09/2021 5.9  1.7 - 7.7 K/uL Final   Lymphocytes Relative 09/09/2021 20  % Final   Lymphs Abs 09/09/2021 1.7  0.7 - 4.0 K/uL Final   Monocytes Relative 09/09/2021 8  % Final   Monocytes Absolute 09/09/2021 0.7  0.1 - 1.0 K/uL  Final   Eosinophils Relative 09/09/2021 1  % Final   Eosinophils Absolute 09/09/2021 0.1  0.0 - 0.5 K/uL Final   Basophils Relative 09/09/2021 1  % Final   Basophils Absolute 09/09/2021 0.1  0.0 - 0.1 K/uL Final   Immature Granulocytes 09/09/2021 0  % Final   Abs Immature Granulocytes 09/09/2021 0.03  0.00 - 0.07 K/uL Final   Performed at [facility], [place] [address] [place], [place] [address]   Ammonia 09/09/2021 37 (H)  9 - 35 umol/L Final   Comment: HEMOLYSIS AT THIS LEVEL MAY AFFECT RESULT Performed at [facility], [place] [address] [place], [place] [address]    Opiates 09/09/2021 NONE DETECTED  NONE DETECTED Final   Cocaine 09/09/2021 NONE DETECTED  NONE DETECTED Final   Benzodiazepines 09/09/2021 NONE DETECTED  NONE DETECTED Final   Amphetamines 09/09/2021 NONE DETECTED  NONE DETECTED Final   Tetrahydrocannabinol 09/09/2021 NONE DETECTED  NONE DETECTED Final   Barbiturates 09/09/2021 NONE DETECTED  NONE DETECTED Final   Comment: (NOTE) DRUG SCREEN FOR MEDICAL PURPOSES ONLY.  IF CONFIRMATION IS NEEDED FOR ANY PURPOSE, NOTIFY LAB WITHIN 5 DAYS.  LOWEST DETECTABLE LIMITS FOR URINE DRUG SCREEN Drug Class                     Cutoff (ng/mL) Amphetamine and metabolites    1000 Barbiturate and metabolites    200 Benzodiazepine                 557 Tricyclics and metabolites     300 Opiates and metabolites        300 Cocaine and metabolites        300 THC                            50 Performed at [facility], [place] [address], [place], [place] [address]    Alcohol, Ethyl (B) 09/09/2021 <10  <10 mg/dL Final   Comment: (NOTE) Lowest detectable limit for serum alcohol is 10 mg/dL.  For medical purposes only. Performed at [facility], [place] [address] [place], [place] [address]    Lipase 09/09/2021 33  11 - 51 U/L Final   Performed at [facility] [address], [place], [place] [address]   Color, Urine 09/09/2021 COLORLESS (A)  YELLOW Final   APPearance 09/09/2021 CLEAR  CLEAR Final   Specific Gravity, Urine 09/09/2021 1.002 (L)  1.005 - 1.030 Final   pH 09/09/2021 6.0  5.0 - 8.0 Final   Glucose, UA 09/09/2021 NEGATIVE  NEGATIVE mg/dL Final   Hgb urine dipstick 09/09/2021 NEGATIVE  NEGATIVE Final   Bilirubin Urine 09/09/2021 NEGATIVE  NEGATIVE Final   Ketones, ur 09/09/2021 NEGATIVE  NEGATIVE mg/dL Final   Protein,  ur 09/09/2021 NEGATIVE  NEGATIVE mg/dL Final   Nitrite 09/09/2021 NEGATIVE  NEGATIVE Final   Leukocytes,Ua 09/09/2021 NEGATIVE  NEGATIVE Final   Performed at [facility] [address], [place], [place] [address]   Specimen Description 09/09/2021 URINE, CLEAN CATCH   Final   Special Requests 09/09/2021 NONE   Final   Culture 09/09/2021  (A)   Final                   Value:<10,000 COLONIES/mL INSIGNIFICANT GROWTH Performed at [facility], [place] [address] [place], [place] [address]    Report Status 09/09/2021 09/10/2021 FINAL  Final   D-Dimer, Quant 09/09/2021 0.92 (H)  0.00 - 0.50 ug/mL-FEU Final   Comment: (NOTE) At the manufacturer cut-off value of 0.5 g/mL FEU, this assay has a negative predictive value of 95-100%.This assay is intended for use in conjunction with a clinical pretest probability (PTP) assessment model to exclude pulmonary embolism (PE) and deep venous thrombosis (DVT) in outpatients suspected of PE or DVT. Results should be correlated with clinical presentation. Performed at [facility], [place] [address], [place], [place] [address]    Troponin I (High Sensitivity) 09/09/2021 5  <18 ng/L Final   Comment: (NOTE) Elevated high sensitivity troponin I (hsTnI) values and significant  changes across serial measurements may suggest ACS but many other  chronic and acute conditions are known to elevate hsTnI results.  Refer to the "Links" section for chest pain algorithms and additional  guidance. Performed at [facility], [place] [address], [place], [place] [address]    BP 09/10/2021 135/83  mmHg Final   S' Lateral 09/10/2021 2.30  cm Final   Area-P 1/2 09/10/2021 5.58  cm2 Final    Blood Alcohol level:  Lab Results  Component Value Date   ETH <10 11/23/2021   ETH <10 16/55/3748    Metabolic Disorder Labs: Lab Results  Component Value Date   HGBA1C 7.0 (H) 10/04/2021   MPG 154.2 10/04/2021   Lab Results  Component Value Date    PROLACTIN 3.8 (L) 11/23/2021   Lab Results  Component Value Date   CHOL 171 11/23/2021   TRIG 125 11/23/2021   HDL 65 11/23/2021   CHOLHDL 2.6 11/23/2021   VLDL 25 11/23/2021   LDLCALC 81 11/23/2021   LDLCALC 95 10/04/2021    Therapeutic Lab Levels: No results found for: "LITHIUM" Lab Results  Component Value Date   VALPROATE 45 (L) 11/09/2021   VALPROATE 57 10/08/2021   No results found for: "CBMZ"  Physical Findings   PHQ2-9    [place] ED from 11/23/2021 in [facility]  PHQ-2 Total Score 2  PHQ-9 Total Score 3      Flowsheet Row ED from 11/23/2021 in [facility] Most recent reading at 11/23/2021  6:44 PM ED from 11/22/2021 in [facility] DEPT Most recent reading at 11/22/2021  8:28 PM ED from 11/22/2021 in [facility] DEPT Most recent reading at 11/22/2021  1:24 AM  C-SSRS RISK CATEGORY No Risk No Risk No Risk        Musculoskeletal  Strength & Muscle Tone: within normal limits Gait & Station: normal Patient leans: N/A  Psychiatric Specialty Exam  Presentation  General Appearance: Appropriate for Environment; Casual  Eye Contact:Good  Speech:Clear and Coherent; Normal Rate  Speech Volume:Normal  Handedness:Right   Mood and Affect  Mood:Euthymic  Affect:Appropriate; Congruent   Thought Process  Thought Processes:Coherent; Goal Directed; Linear  Descriptions of Associations:Intact  Orientation:Full (Time, Place and Person)  Thought Content:Logical; WDL  Diagnosis of Schizophrenia or Schizoaffective disorder in past: Yes  Duration of Psychotic Symptoms: No data recorded  Hallucinations:Hallucinations: None  Ideas of Reference:None  Suicidal Thoughts:Suicidal Thoughts: No  Homicidal Thoughts:Homicidal Thoughts: No   Sensorium  Memory:Immediate Fair  Judgment:Impaired  Insight:Shallow   Executive Functions   Concentration:Good  Attention Span:Good  Recall:Good  Fund of Knowledge:Fair  Language:Fair   Psychomotor Activity  Psychomotor Activity:Psychomotor Activity: Normal   Assets  Assets:Communication Skills; Desire for Improvement; Financial Resources/Insurance; Resilience; Social Support   Sleep  Sleep:Sleep: Good   No data recorded  Physical Exam  Physical Exam Vitals and nursing note reviewed.  Constitutional:      Appearance: Normal appearance. She is well-developed.  HENT:     Head: Normocephalic and atraumatic.     Nose: Nose normal.  Cardiovascular:     Rate and Rhythm: Normal rate.  Pulmonary:     Effort: Pulmonary effort is normal.  Musculoskeletal:        General: Normal range of motion.     Cervical back: Normal range of motion.  Skin:    General: Skin is warm and dry.  Neurological:     Mental Status: She is alert and oriented to person, place, and time.  Psychiatric:        Attention and Perception: Attention and perception normal.        Mood and Affect: Mood and affect normal.        Speech: Speech normal.        Behavior: Behavior normal. Behavior is cooperative.        Thought Content: Thought content normal.    Review of Systems  Constitutional: Negative.   HENT: Negative.    Eyes: Negative.   Respiratory: Negative.    Cardiovascular: Negative.   Gastrointestinal: Negative.   Genitourinary: Negative.   Musculoskeletal: Negative.   Skin: Negative.   Neurological: Negative.   Psychiatric/Behavioral: Negative.     Blood pressure (!) 137/92, pulse 88, temperature 98.4 F (36.9 C), temperature source Oral, resp. rate 16, SpO2 100 %. There is no height or weight on file to calculate BMI.  Treatment Plan Summary: Daily contact with patient to assess and evaluate symptoms and progress in treatment Patient reviewed with Dr Hampton Abbot.  Patient remains cleared by psychiatry.    TOC team continues to assist with disposition and safe  discharge plan. APS currently seeking guardianship as DSS cannot place [place] without guardianship. Guardianship process potentially requires up to 30 days.   Lucky Rathke, FNP 11/27/2021 10:31 AM

## 2021-11-27 NOTE — ED Notes (Signed)
Pt sleeping@this time. Breathing even and unlabored. Will continue to monitor for safety 

## 2021-11-27 NOTE — Progress Notes (Signed)
Received Teresa Murillo this AM asleep in her chair bed. She was awaken for glucose check, ate breakfast and received her AM medications. She continued to be compliant with her medications throughout the morning. She endorsed feeling a little anxious this morning. She denied all other psychiatric symptoms.

## 2021-11-27 NOTE — ED Notes (Signed)
Pt A&O x 4, no distress noted, resting at present, calm & cooperative.  Snack given.  Monitoring for safety.

## 2021-11-28 LAB — GLUCOSE, CAPILLARY
Glucose-Capillary: 107 mg/dL — ABNORMAL HIGH (ref 70–99)
Glucose-Capillary: 166 mg/dL — ABNORMAL HIGH (ref 70–99)
Glucose-Capillary: 89 mg/dL (ref 70–99)
Glucose-Capillary: 243 mg/dL — ABNORMAL HIGH (ref 70–99)

## 2021-11-28 NOTE — ED Provider Notes (Signed)
Behavioral Health Progress Note  Date and Time: 11/28/2021 9:16 PM Name: Teresa Murillo MRN:  [id number]  Subjective:  Patient states "I'm fine today" Diagnosis:  Final diagnoses:  At risk for self care deficit  Noncompliance  Self-care deficit for medication administration  Schizophrenia, unspecified type [facility])   Teresa Murillo 61 y.o., female initially admitted to continuous assessment unit on 11/23/20, re-evaluated today as she remains admitted while SW continues to seek placement as patient is unable to return to group home.  Teresa Murillo, 61 y.o., female patient seen face to face by this provider, consulted with Dr. Dwyane Dee and chart reviewed on 11/28/21.  On evaluation Teresa Murillo appears alert and oriented x without acute distress.  She is initially lying down during evaluation, patient sits upright on the edge of bed. Teresa Murillo denies suicidal or homicidal thoughts. Objectively there is no evidence of psychosis/mania or delusional thinking.  Patient is able to converse coherently, goal directed thoughts, no distractibility and has normal speech, and behavior.      Total Time spent with patient: 15 minutes  Past Psychiatric History:  Schizoaffective Disorder  Past Medical History:  Past Medical History:  Diagnosis Date   Chronic obstructive pulmonary disease (COPD) ([place])    Glaucoma    Hyperlipidemia    Hypertension    Iron deficiency    Schizoaffective disorder ([place])    Type 2 diabetes mellitus ([place])     Past Surgical History:  Procedure Laterality Date   TUBAL LIGATION     Family History:  Family History  Problem Relation Age of Onset   Breast cancer Maternal Grandmother    Family Psychiatric  History: No reported family mental health history  Social History:  Social History   Substance and Sexual Activity  Alcohol Use Yes     Social History   Substance and Sexual Activity  Drug Use Not Currently    Social History   Socioeconomic History   Marital  status: Divorced    Spouse name: Not on file   Number of children: Not on file   Years of education: Not on file   Highest education level: Not on file  Occupational History   Not on file  Tobacco Use   Smoking status: Every Day    Packs/day: 1.00    Types: Cigars, Cigarettes   Smokeless tobacco: Current  Vaping Use   Vaping Use: Never used  Substance and Sexual Activity   Alcohol use: Yes   Drug use: Not Currently   Sexual activity: Not Currently    Birth control/protection: Surgical  Other Topics Concern   Not on file  Social History Narrative   Not on file   Social Determinants of Health   Financial Resource Strain: Not on file  Food Insecurity: Not on file  Transportation Needs: Not on file  Physical Activity: Not on file  Stress: Not on file  Social Connections: Not on file   SDOH:  SDOH Screenings   Depression (PHQ2-9): Low Risk  (11/23/2021)  Tobacco Use: High Risk (11/23/2021)   Additional Social History:    Pain Medications: See MAR Prescriptions: See MAR Over the Counter: See MAR History of alcohol / drug use?: No history of alcohol / drug abuse Longest period of sobriety (when/how long): N/A                   Sleep: Good  Appetite:  Good  Current Medications:  Current Facility-Administered Medications  Medication Dose Route Frequency Provider Last Rate Last  Admin   acetaminophen (TYLENOL) tablet 650 mg  650 mg Oral Q6H PRN Rankin, Shuvon B, NP   650 mg at 11/28/21 2015   albuterol (VENTOLIN HFA) 108 (90 Base) MCG/ACT inhaler 2 puff  2 puff Inhalation Q6H PRN Rankin, Shuvon B, NP       alum & mag hydroxide-simeth (MAALOX/MYLANTA) 200-200-20 MG/5ML suspension 30 mL  30 mL Oral Q4H PRN Rankin, Shuvon B, NP       apixaban (ELIQUIS) tablet 5 mg  5 mg Oral BID Rankin, Shuvon B, NP   5 mg at 11/28/21 2107   cloZAPine (CLOZARIL) tablet 50 mg  50 mg Oral Daily [name], NP   50 mg at 11/28/21 0947   cloZAPine (CLOZARIL) tablet 75 mg  75 mg Oral  QHS [name], NP   75 mg at 11/28/21 2107   diltiazem (CARDIZEM CD) 24 hr capsule 240 mg  240 mg Oral Daily Rankin, Shuvon B, NP   240 mg at 11/28/21 0940   divalproex (DEPAKOTE ER) 24 hr tablet 500 mg  500 mg Oral BID Rankin, Shuvon B, NP   500 mg at 11/28/21 2108   fluticasone furoate-vilanterol (BREO ELLIPTA) 200-25 MCG/ACT 1 puff  1 puff Inhalation Daily Rankin, Shuvon B, NP   1 puff at 11/28/21 0811   haloperidol (HALDOL) tablet 10 mg  10 mg Oral Q8H PRN Rankin, Shuvon B, NP       insulin aspart (novoLOG) injection 0-15 Units  0-15 Units Subcutaneous TID WC Rankin, Shuvon B, NP   3 Units at 11/28/21 1229   insulin glargine-yfgn (SEMGLEE) injection 12 Units  12 Units Subcutaneous Daily Rankin, Shuvon B, NP   12 Units at 11/28/21 0941   latanoprost (XALATAN) 0.005 % ophthalmic solution 1 drop  1 drop Both Eyes QHS Rankin, Shuvon B, NP   1 drop at 11/28/21 2108   magnesium hydroxide (MILK OF MAGNESIA) suspension 30 mL  30 mL Oral Daily PRN Rankin, Shuvon B, NP       metFORMIN (GLUCOPHAGE) tablet 500 mg  500 mg Oral BID WC Rankin, Shuvon B, NP   500 mg at 11/28/21 1643   mupirocin ointment (BACTROBAN) 2 % 1 Application  1 Application Topical TID Rankin, Shuvon B, NP   1 Application at 24/58/09 2108   nicotine (NICODERM CQ - dosed in mg/24 hr) patch 7 mg  7 mg Transdermal Daily Rankin, Shuvon B, NP   7 mg at 11/28/21 0942   [START ON 12/03/2021] Vitamin D (Ergocalciferol) (DRISDOL) 1.25 MG (50000 UNIT) capsule 50,000 Units  50,000 Units Oral Q7 days Rankin, Shuvon B, NP       Current Outpatient Medications  Medication Sig Dispense Refill   Accu-Chek Softclix Lancets lancets Use as directed up to four times daily 100 each 0   apixaban (ELIQUIS) 5 MG TABS tablet Take 1 tablet (5 mg total) by mouth 2 (two) times daily. 60 tablet 0   ARIPiprazole (ABILIFY) 10 MG tablet Take 1 tablet (10 mg total) by mouth daily. 30 tablet 0   Blood Glucose Monitoring Suppl (ACCU-CHEK GUIDE) w/Device KIT Use as  directed up to four times daily 1 kit 0   budesonide-formoterol (SYMBICORT) 160-4.5 MCG/ACT inhaler Inhale 2 puffs into the lungs in the morning and at bedtime.     Cholecalciferol (VITAMIN D3) 1.25 MG (50000 UT) CAPS Take 50,000 Units by mouth every Thursday.     cloZAPine (CLOZARIL) 25 MG tablet Take 3 tablets (75 mg total) by mouth at bedtime.  90 tablet 0   clozapine (CLOZARIL) 50 MG tablet Take 1 tablet (50 mg total) by mouth daily. 30 tablet 0   diltiazem (CARDIZEM CD) 240 MG 24 hr capsule Take 1 capsule (240 mg total) by mouth daily. (Patient not taking: Reported on 11/24/2021) 30 capsule 0   divalproex (DEPAKOTE ER) 500 MG 24 hr tablet Take 1 tablet (500 mg total) by mouth 2 (two) times daily. 60 tablet 0   glucose blood test strip Use as directed up to four times daily 50 each 0   haloperidol (HALDOL) 10 MG tablet Take 1 tablet (10 mg total) by mouth 3 (three) times daily as needed for agitation (and psychotic symptoms).     INGREZZA 40 MG capsule Take 1 capsule (40 mg total) by mouth in the morning. 30 capsule 0   insulin aspart (NOVOLOG) 100 UNIT/ML FlexPen Before each meal 3 times a day, 140-199 - 2 units, 200-250 - 4 units, 251-299 - 6 units,  300-349 - 8 units,  350 or above 10 units. Insulin PEN if approved, provide syringes and needles if needed.Please switch to any approved short acting Insulin if needed. 15 mL 0   insulin glargine (LANTUS) 100 UNIT/ML Solostar Pen Inject 12 Units into the skin daily. 15 mL 0   Insulin Pen Needle 32G X 4 MM MISC Use 4 times a day with insulin, 1 month supply. 100 each 0   latanoprost (XALATAN) 0.005 % ophthalmic solution Place 1 drop into both eyes at bedtime.     metFORMIN (GLUCOPHAGE) 500 MG tablet Take 500 mg by mouth 2 (two) times daily with a meal.     nicotine (NICODERM CQ - DOSED IN MG/24 HR) 7 mg/24hr patch Place 1 patch (7 mg total) onto the skin daily. 28 patch 0   PROAIR HFA 108 (90 Base) MCG/ACT inhaler Inhale 2 puffs into the lungs every  6 (six) hours as needed for wheezing or shortness of breath.      Labs  Lab Results:  Admission on 11/23/2021  Component Date Value Ref Range Status   SARS Coronavirus 2 by RT PCR 11/23/2021 NEGATIVE  NEGATIVE Final   Comment: (NOTE) SARS-CoV-2 target nucleic acids are NOT DETECTED.  The SARS-CoV-2 RNA is generally detectable in upper respiratory specimens during the acute phase of infection. The lowest concentration of SARS-CoV-2 viral copies this assay can detect is 138 copies/mL. A negative result does not preclude SARS-Cov-2 infection and should not be used as the sole basis for treatment or other patient management decisions. A negative result may occur with  improper specimen collection/handling, submission of specimen other than nasopharyngeal swab, presence of viral mutation(s) within the areas targeted by this assay, and inadequate number of viral copies(<138 copies/mL). A negative result must be combined with clinical observations, patient history, and epidemiological information. The expected result is Negative.  Fact Sheet for Patients:  EntrepreneurPulse.com.au  Fact Sheet for Healthcare Providers:  IncredibleEmployment.be  This test is no                          t yet approved or cleared by the [place] FDA and  has been authorized for detection and/or diagnosis of SARS-CoV-2 by FDA under an Emergency Use Authorization (EUA). This EUA will remain  in effect (meaning this test can be used) for the duration of the COVID-19 declaration under Section 564(b)(1) of the Act, 21 U.S.C.section 360bbb-3(b)(1), unless the authorization is terminated  or revoked sooner.  Influenza A by PCR 11/23/2021 NEGATIVE  NEGATIVE Final   Influenza B by PCR 11/23/2021 NEGATIVE  NEGATIVE Final   Comment: (NOTE) The Xpert Xpress SARS-CoV-2/FLU/RSV plus assay is intended as an aid in the diagnosis of influenza from Nasopharyngeal swab  specimens and should not be used as a sole basis for treatment. Nasal washings and aspirates are unacceptable for Xpert Xpress SARS-CoV-2/FLU/RSV testing.  Fact Sheet for Patients: EntrepreneurPulse.com.au  Fact Sheet for Healthcare Providers: IncredibleEmployment.be  This test is not yet approved or cleared by the [place] FDA and has been authorized for detection and/or diagnosis of SARS-CoV-2 by FDA under an Emergency Use Authorization (EUA). This EUA will remain in effect (meaning this test can be used) for the duration of the COVID-19 declaration under Section 564(b)(1) of the Act, 21 U.S.C. section 360bbb-3(b)(1), unless the authorization is terminated or revoked.  Performed at [facility], [place] [address] [place], [place] [address]    WBC 11/23/2021 7.4  4.0 - 10.5 K/uL Final   RBC 11/23/2021 4.25  3.87 - 5.11 MIL/uL Final   Hemoglobin 11/23/2021 11.1 (L)  12.0 - 15.0 g/dL Final   HCT 11/23/2021 34.6 (L)  36.0 - 46.0 % Final   MCV 11/23/2021 81.4  80.0 - 100.0 fL Final   MCH 11/23/2021 26.1  26.0 - 34.0 pg Final   MCHC 11/23/2021 32.1  30.0 - 36.0 g/dL Final   RDW 11/23/2021 17.7 (H)  11.5 - 15.5 % Final   Platelets 11/23/2021 365  150 - 400 K/uL Final   nRBC 11/23/2021 0.0  0.0 - 0.2 % Final   Neutrophils Relative % 11/23/2021 59  % Final   Neutro Abs 11/23/2021 4.3  1.7 - 7.7 K/uL Final   Lymphocytes Relative 11/23/2021 31  % Final   Lymphs Abs 11/23/2021 2.3  0.7 - 4.0 K/uL Final   Monocytes Relative 11/23/2021 7  % Final   Monocytes Absolute 11/23/2021 0.5  0.1 - 1.0 K/uL Final   Eosinophils Relative 11/23/2021 2  % Final   Eosinophils Absolute 11/23/2021 0.1  0.0 - 0.5 K/uL Final   Basophils Relative 11/23/2021 1  % Final   Basophils Absolute 11/23/2021 0.1  0.0 - 0.1 K/uL Final   Immature Granulocytes 11/23/2021 0  % Final   Abs Immature Granulocytes 11/23/2021 0.01  0.00 - 0.07 K/uL Final   Performed at [facility], [place] [address] [place], [place] [address]   Sodium 11/23/2021 139  135 - 145 mmol/L Final   Potassium 11/23/2021 4.1  3.5 - 5.1 mmol/L Final   Chloride 11/23/2021 105  98 - 111 mmol/L Final   CO2 11/23/2021 26  22 - 32 mmol/L Final   Glucose, Bld 11/23/2021 101 (H)  70 - 99 mg/dL Final   Glucose reference range applies only to samples taken after fasting for at least 8 hours.   BUN 11/23/2021 5 (L)  6 - 20 mg/dL Final   Creatinine, Ser 11/23/2021 0.51  0.44 - 1.00 mg/dL Final   Calcium 11/23/2021 9.0  8.9 - 10.3 mg/dL Final   Total Protein 11/23/2021 6.7  6.5 - 8.1 g/dL Final   Albumin 11/23/2021 3.6  3.5 - 5.0 g/dL Final   AST 11/23/2021 24  15 - 41 U/L Final   ALT 11/23/2021 18  0 - 44 U/L Final   Alkaline Phosphatase 11/23/2021 62  38 - 126 U/L Final   Total Bilirubin 11/23/2021 0.4  0.3 - 1.2 mg/dL Final  GFR, Estimated 11/23/2021 >60  >60 mL/min Final   Comment: (NOTE) Calculated using the CKD-EPI Creatinine Equation (2021)    Anion gap 11/23/2021 8  5 - 15 Final   Performed at [facility], [place] [address], [place], [place] [address]   Magnesium 11/23/2021 2.0  1.7 - 2.4 mg/dL Final   Performed at [place] [address], [place], [place] [address]   Alcohol, Ethyl (B) 11/23/2021 <10  <10 mg/dL Final   Comment: (NOTE) Lowest detectable limit for serum alcohol is 10 mg/dL.  For medical purposes only. Performed at [facility], [place] [address], [place], [place] [address]    Prolactin 11/23/2021 3.8 (L)  4.8 - 23.3 ng/mL Final   Comment: (NOTE) Performed At: [name] [place], [place] [id number] Rush Farmer MD EY:[id number]    Color, Urine 11/23/2021 YELLOW  YELLOW Final   APPearance 11/23/2021 CLEAR  CLEAR Final   Specific Gravity, Urine 11/23/2021 1.025  1.005 - 1.030 Final   pH 11/23/2021 6.5  5.0 - 8.0 Final   Glucose, UA 11/23/2021 NEGATIVE  NEGATIVE mg/dL Final   Hgb urine dipstick 11/23/2021  NEGATIVE  NEGATIVE Final   Bilirubin Urine 11/23/2021 NEGATIVE  NEGATIVE Final   Ketones, ur 11/23/2021 NEGATIVE  NEGATIVE mg/dL Final   Protein, ur 11/23/2021 NEGATIVE  NEGATIVE mg/dL Final   Nitrite 11/23/2021 NEGATIVE  NEGATIVE Final   Leukocytes,Ua 11/23/2021 NEGATIVE  NEGATIVE Final   Microscopic not done on urines with negative protein, blood, leukocytes, nitrite, or glucose < 500 mg/dL.   RBC / HPF 11/23/2021 0-5  0 - 5 RBC/hpf Final   WBC, UA 11/23/2021 0-5  0 - 5 WBC/hpf Final   Bacteria, UA 11/23/2021 NONE SEEN  NONE SEEN Final   Squamous Epithelial / LPF 11/23/2021 0-5  0 - 5 Final   Mucus 11/23/2021 PRESENT   Final   Performed at [facility], [place] [address] [place], [place] [address]   POC Amphetamine UR 11/23/2021 None Detected  NONE DETECTED (Cut Off Level 1000 ng/mL) Final   POC Secobarbital (BAR) 11/23/2021 None Detected  NONE DETECTED (Cut Off Level 300 ng/mL) Final   POC Buprenorphine (BUP) 11/23/2021 None Detected  NONE DETECTED (Cut Off Level 10 ng/mL) Final   POC Oxazepam (BZO) 11/23/2021 None Detected  NONE DETECTED (Cut Off Level 300 ng/mL) Final   POC Cocaine UR 11/23/2021 None Detected  NONE DETECTED (Cut Off Level 300 ng/mL) Final   POC Methamphetamine UR 11/23/2021 None Detected  NONE DETECTED (Cut Off Level 1000 ng/mL) Final   POC Morphine 11/23/2021 None Detected  NONE DETECTED (Cut Off Level 300 ng/mL) Final   POC Methadone UR 11/23/2021 None Detected  NONE DETECTED (Cut Off Level 300 ng/mL) Final   POC Oxycodone UR 11/23/2021 None Detected  NONE DETECTED (Cut Off Level 100 ng/mL) Final   POC Marijuana UR 11/23/2021 None Detected  NONE DETECTED (Cut Off Level 50 ng/mL) Final   SARSCOV2ONAVIRUS 2 AG 11/23/2021 NEGATIVE  NEGATIVE Final   Comment: (NOTE) SARS-CoV-2 antigen NOT DETECTED.   Negative results are presumptive.  Negative results do not preclude SARS-CoV-2 infection and should not be used as the sole basis for treatment or other patient  management decisions, including infection  control decisions, particularly in the presence of clinical signs and  symptoms consistent with COVID-19, or in those who have been in contact with the virus.  Negative results must be combined with clinical observations, patient history,  and epidemiological information. The expected result is Negative.  Fact Sheet for Patients: HandmadeRecipes.com.cy  Fact Sheet for Healthcare Providers: FuneralLife.at  This test is not yet approved or cleared by the [place] FDA and  has been authorized for detection and/or diagnosis of SARS-CoV-2 by FDA under an Emergency Use Authorization (EUA).  This EUA will remain in effect (meaning this test can be used) for the duration of  the COV                          ID-19 declaration under Section 564(b)(1) of the Act, 21 U.S.C. section 360bbb-3(b)(1), unless the authorization is terminated or revoked sooner.     Cholesterol 11/23/2021 171  0 - 200 mg/dL Final   Triglycerides 11/23/2021 125  <150 mg/dL Final   HDL 11/23/2021 65  >40 mg/dL Final   Total CHOL/HDL Ratio 11/23/2021 2.6  RATIO Final   VLDL 11/23/2021 25  0 - 40 mg/dL Final   LDL Cholesterol 11/23/2021 81  0 - 99 mg/dL Final   Comment:        Total Cholesterol/HDL:CHD Risk Coronary Heart Disease Risk Table                     Men   Women  1/2 Average Risk   3.4   3.3  Average Risk       5.0   4.4  2 X Average Risk   9.6   7.1  3 X Average Risk  23.4   11.0        Use the calculated Patient Ratio above and the CHD Risk Table to determine the patient's CHD Risk.        ATP III CLASSIFICATION (LDL):  <100     mg/dL   Optimal  100-129  mg/dL   Near or Above                    Optimal  130-159  mg/dL   Borderline  160-189  mg/dL   High  >190     mg/dL   Very High Performed at [place] [address] [place], [place] [address]    TSH 11/23/2021 1.788  0.350 - 4.500 uIU/mL  Final   Comment: Performed by a 3rd Generation assay with a functional sensitivity of <=0.01 uIU/mL. Performed at [facility], [place] [address] [place], [place] [address]    Glucose-Capillary 11/23/2021 137 (H)  70 - 99 mg/dL Final   Glucose reference range applies only to samples taken after fasting for at least 8 hours.   Glucose-Capillary 11/24/2021 144 (H)  70 - 99 mg/dL Final   Glucose reference range applies only to samples taken after fasting for at least 8 hours.   Glucose-Capillary 11/24/2021 87  70 - 99 mg/dL Final   Glucose reference range applies only to samples taken after fasting for at least 8 hours.   Glucose-Capillary 11/24/2021 269 (H)  70 - 99 mg/dL Final   Glucose reference range applies only to samples taken after fasting for at least 8 hours.   Glucose-Capillary 11/25/2021 112 (H)  70 - 99 mg/dL Final   Glucose reference range applies only to samples taken after fasting for at least 8 hours.   Glucose-Capillary 11/25/2021 136 (H)  70 - 99 mg/dL Final   Glucose reference range applies only to samples taken after fasting for at least 8 hours.   Glucose-Capillary  11/25/2021 187 (H)  70 - 99 mg/dL Final   Glucose reference range applies only to samples taken after fasting for at least 8 hours.   Glucose-Capillary 11/25/2021 159 (H)  70 - 99 mg/dL Final   Glucose reference range applies only to samples taken after fasting for at least 8 hours.   Glucose-Capillary 11/26/2021 122 (H)  70 - 99 mg/dL Final   Glucose reference range applies only to samples taken after fasting for at least 8 hours.   Glucose-Capillary 11/26/2021 140 (H)  70 - 99 mg/dL Final   Glucose reference range applies only to samples taken after fasting for at least 8 hours.   Glucose-Capillary 11/26/2021 102 (H)  70 - 99 mg/dL Final   Glucose reference range applies only to samples taken after fasting for at least 8 hours.   Glucose-Capillary 11/26/2021 166 (H)  70 - 99 mg/dL Final   Glucose  reference range applies only to samples taken after fasting for at least 8 hours.   Glucose-Capillary 11/27/2021 133 (H)  70 - 99 mg/dL Final   Glucose reference range applies only to samples taken after fasting for at least 8 hours.   Glucose-Capillary 11/27/2021 146 (H)  70 - 99 mg/dL Final   Glucose reference range applies only to samples taken after fasting for at least 8 hours.   Glucose-Capillary 11/27/2021 144 (H)  70 - 99 mg/dL Final   Glucose reference range applies only to samples taken after fasting for at least 8 hours.   Glucose-Capillary 11/27/2021 154 (H)  70 - 99 mg/dL Final   Glucose reference range applies only to samples taken after fasting for at least 8 hours.   Glucose-Capillary 11/28/2021 107 (H)  70 - 99 mg/dL Final   Glucose reference range applies only to samples taken after fasting for at least 8 hours.   Glucose-Capillary 11/28/2021 166 (H)  70 - 99 mg/dL Final   Glucose reference range applies only to samples taken after fasting for at least 8 hours.   Glucose-Capillary 11/28/2021 89  70 - 99 mg/dL Final   Glucose reference range applies only to samples taken after fasting for at least 8 hours.   Comment 1 11/28/2021 NURRP   Final  Admission on 11/22/2021, Discharged on 11/22/2021  Component Date Value Ref Range Status   Glucose-Capillary 11/22/2021 169 (H)  70 - 99 mg/dL Final   Glucose reference range applies only to samples taken after fasting for at least 8 hours.  No results displayed because visit has over 200 results.    Admission on 10/04/2021, Discharged on 10/22/2021  Component Date Value Ref Range Status   SARS Coronavirus 2 by RT PCR 10/04/2021 NEGATIVE  NEGATIVE Final   Comment: (NOTE) SARS-CoV-2 target nucleic acids are NOT DETECTED.  The SARS-CoV-2 RNA is generally detectable in upper respiratory specimens during the acute phase of infection. The lowest concentration of SARS-CoV-2 viral copies this assay can detect is 138 copies/mL. A  negative result does not preclude SARS-Cov-2 infection and should not be used as the sole basis for treatment or other patient management decisions. A negative result may occur with  improper specimen collection/handling, submission of specimen other than nasopharyngeal swab, presence of viral mutation(s) within the areas targeted by this assay, and inadequate number of viral copies(<138 copies/mL). A negative result must be combined with clinical observations, patient history, and epidemiological information. The expected result is Negative.  Fact Sheet for Patients:  EntrepreneurPulse.com.au  Fact Sheet for Healthcare Providers:  IncredibleEmployment.be  This test is no                          t yet approved or cleared by the [place] and  has been authorized for detection and/or diagnosis of SARS-CoV-2 by FDA under an Emergency Use Authorization (EUA). This EUA will remain  in effect (meaning this test can be used) for the duration of the COVID-19 declaration under Section 564(b)(1) of the Act, 21 U.S.C.section 360bbb-3(b)(1), unless the authorization is terminated  or revoked sooner.       Influenza A by PCR 10/04/2021 NEGATIVE  NEGATIVE Final   Influenza B by PCR 10/04/2021 NEGATIVE  NEGATIVE Final   Comment: (NOTE) The Xpert Xpress SARS-CoV-2/FLU/RSV plus assay is intended as an aid in the diagnosis of influenza from Nasopharyngeal swab specimens and should not be used as a sole basis for treatment. Nasal washings and aspirates are unacceptable for Xpert Xpress SARS-CoV-2/FLU/RSV testing.  Fact Sheet for Patients: EntrepreneurPulse.com.au  Fact Sheet for Healthcare Providers: IncredibleEmployment.be  This test is not yet approved or cleared by the [place] FDA and has been authorized for detection and/or diagnosis of SARS-CoV-2 by FDA under an Emergency Use Authorization (EUA). This  EUA will remain in effect (meaning this test can be used) for the duration of the COVID-19 declaration under Section 564(b)(1) of the Act, 21 U.S.C. section 360bbb-3(b)(1), unless the authorization is terminated or revoked.  Performed at [facility], [place] [address], [place], [place] [address]    WBC 10/04/2021 8.4  4.0 - 10.5 K/uL Final   RBC 10/04/2021 4.42  3.87 - 5.11 MIL/uL Final   Hemoglobin 10/04/2021 11.6 (L)  12.0 - 15.0 g/dL Final   HCT 10/04/2021 35.7 (L)  36.0 - 46.0 % Final   MCV 10/04/2021 80.8  80.0 - 100.0 fL Final   MCH 10/04/2021 26.2  26.0 - 34.0 pg Final   MCHC 10/04/2021 32.5  30.0 - 36.0 g/dL Final   RDW 10/04/2021 16.0 (H)  11.5 - 15.5 % Final   Platelets 10/04/2021 372  150 - 400 K/uL Final   nRBC 10/04/2021 0.0  0.0 - 0.2 % Final   Neutrophils Relative % 10/04/2021 68  % Final   Neutro Abs 10/04/2021 5.7  1.7 - 7.7 K/uL Final   Lymphocytes Relative 10/04/2021 22  % Final   Lymphs Abs 10/04/2021 1.8  0.7 - 4.0 K/uL Final   Monocytes Relative 10/04/2021 8  % Final   Monocytes Absolute 10/04/2021 0.7  0.1 - 1.0 K/uL Final   Eosinophils Relative 10/04/2021 1  % Final   Eosinophils Absolute 10/04/2021 0.1  0.0 - 0.5 K/uL Final   Basophils Relative 10/04/2021 1  % Final   Basophils Absolute 10/04/2021 0.1  0.0 - 0.1 K/uL Final   Immature Granulocytes 10/04/2021 0  % Final   Abs Immature Granulocytes 10/04/2021 0.03  0.00 - 0.07 K/uL Final   Performed at [facility], [place] [address] [place], [place] [address]   Sodium 10/04/2021 136  135 - 145 mmol/L Final   Potassium 10/04/2021 4.2  3.5 - 5.1 mmol/L Final   Chloride 10/04/2021 104  98 - 111 mmol/L Final   CO2 10/04/2021 25  22 - 32 mmol/L Final   Glucose, Bld 10/04/2021 100 (H)  70 - 99 mg/dL Final   Glucose reference range applies only to samples taken after fasting for at least 8 hours.   BUN  10/04/2021 9  6 - 20 mg/dL Final   Creatinine, Ser 10/04/2021 0.52  0.44 - 1.00 mg/dL Final   Calcium  10/04/2021 9.0  8.9 - 10.3 mg/dL Final   Total Protein 10/04/2021 7.0  6.5 - 8.1 g/dL Final   Albumin 10/04/2021 3.2 (L)  3.5 - 5.0 g/dL Final   AST 10/04/2021 13 (L)  15 - 41 U/L Final   ALT 10/04/2021 10  0 - 44 U/L Final   Alkaline Phosphatase 10/04/2021 61  38 - 126 U/L Final   Total Bilirubin 10/04/2021 0.3  0.3 - 1.2 mg/dL Final   GFR, Estimated 10/04/2021 >60  >60 mL/min Final   Comment: (NOTE) Calculated using the CKD-EPI Creatinine Equation (2021)    Anion gap 10/04/2021 7  5 - 15 Final   Performed at [facility] [address] [place], [place] [address]   Hgb A1c MFr Bld 10/04/2021 7.0 (H)  4.8 - 5.6 % Final   Comment: (NOTE) Pre diabetes:          5.7%-6.4%  Diabetes:              >6.4%  Glycemic control for   <7.0% adults with diabetes    Mean Plasma Glucose 10/04/2021 154.2  mg/dL Final   Performed at [facility], [place] [address] [place], [place] [address]   Cholesterol 10/04/2021 178  0 - 200 mg/dL Final   Triglycerides 10/04/2021 155 (H)  <150 mg/dL Final   HDL 10/04/2021 52  >40 mg/dL Final   Total CHOL/HDL Ratio 10/04/2021 3.4  RATIO Final   VLDL 10/04/2021 31  0 - 40 mg/dL Final   LDL Cholesterol 10/04/2021 95  0 - 99 mg/dL Final   Comment:        Total Cholesterol/HDL:CHD Risk Coronary Heart Disease Risk Table                     Men   Women  1/2 Average Risk   3.4   3.3  Average Risk       5.0   4.4  2 X Average Risk   9.6   7.1  3 X Average Risk  23.4   11.0        Use the calculated Patient Ratio above and the CHD Risk Table to determine the patient's CHD Risk.        ATP III CLASSIFICATION (LDL):  <100     mg/dL   Optimal  100-129  mg/dL   Near or Above                    Optimal  130-159  mg/dL   Borderline  160-189  mg/dL   High  >190     mg/dL   Very High Performed at [facility] [address], [place], [place] [address]    POC Amphetamine UR 10/04/2021 None Detected  NONE DETECTED (Cut Off Level 1000 ng/mL) Final    POC Secobarbital (BAR) 10/04/2021 None Detected  NONE DETECTED (Cut Off Level 300 ng/mL) Final   POC Buprenorphine (BUP) 10/04/2021 None Detected  NONE DETECTED (Cut Off Level 10 ng/mL) Final   POC Oxazepam (BZO) 10/04/2021 None Detected  NONE DETECTED (Cut Off Level 300 ng/mL) Final   POC Cocaine UR 10/04/2021 None Detected  NONE DETECTED (Cut Off Level 300 ng/mL) Final   POC Methamphetamine UR 10/04/2021 None Detected  NONE DETECTED (Cut Off Level 1000 ng/mL) Final  POC Morphine 10/04/2021 None Detected  NONE DETECTED (Cut Off Level 300 ng/mL) Final   POC Methadone UR 10/04/2021 None Detected  NONE DETECTED (Cut Off Level 300 ng/mL) Final   POC Oxycodone UR 10/04/2021 Positive (A)  NONE DETECTED (Cut Off Level 100 ng/mL) Final   POC Marijuana UR 10/04/2021 None Detected  NONE DETECTED (Cut Off Level 50 ng/mL) Final   SARSCOV2ONAVIRUS 2 AG 10/04/2021 NEGATIVE  NEGATIVE Final   Comment: (NOTE) SARS-CoV-2 antigen NOT DETECTED.   Negative results are presumptive.  Negative results do not preclude SARS-CoV-2 infection and should not be used as the sole basis for treatment or other patient management decisions, including infection  control decisions, particularly in the presence of clinical signs and  symptoms consistent with COVID-19, or in those who have been in contact with the virus.  Negative results must be combined with clinical observations, patient history, and epidemiological information. The expected result is Negative.  Fact Sheet for Patients: HandmadeRecipes.com.cy  Fact Sheet for Healthcare Providers: FuneralLife.at  This test is not yet approved or cleared by the [place] FDA and  has been authorized for detection and/or diagnosis of SARS-CoV-2 by FDA under an Emergency Use Authorization (EUA).  This EUA will remain in effect (meaning this test can be used) for the duration of  the COV                          ID-19  declaration under Section 564(b)(1) of the Act, 21 U.S.C. section 360bbb-3(b)(1), unless the authorization is terminated or revoked sooner.     Valproic Acid Lvl 10/04/2021 51  50.0 - 100.0 ug/mL Final   Performed at [facility] [address] [place], [place] [address]   Valproic Acid Lvl 10/08/2021 57  50.0 - 100.0 ug/mL Final   Performed at [facility] [address], [place], [place] [address]   Glucose-Capillary 10/09/2021 123 (H)  70 - 99 mg/dL Final   Glucose reference range applies only to samples taken after fasting for at least 8 hours.  Admission on 09/09/2021, Discharged on 09/10/2021  Component Date Value Ref Range Status   Sodium 09/09/2021 134 (L)  135 - 145 mmol/L Final   Potassium 09/09/2021 4.3  3.5 - 5.1 mmol/L Final   Chloride 09/09/2021 99  98 - 111 mmol/L Final   CO2 09/09/2021 25  22 - 32 mmol/L Final   Glucose, Bld 09/09/2021 107 (H)  70 - 99 mg/dL Final   Glucose reference range applies only to samples taken after fasting for at least 8 hours.   BUN 09/09/2021 12  6 - 20 mg/dL Final   Creatinine, Ser 09/09/2021 0.74  0.44 - 1.00 mg/dL Final   Calcium 09/09/2021 9.0  8.9 - 10.3 mg/dL Final   Total Protein 09/09/2021 7.4  6.5 - 8.1 g/dL Final   Albumin 09/09/2021 3.1 (L)  3.5 - 5.0 g/dL Final   AST 09/09/2021 13 (L)  15 - 41 U/L Final   ALT 09/09/2021 13  0 - 44 U/L Final   Alkaline Phosphatase 09/09/2021 66  38 - 126 U/L Final   Total Bilirubin 09/09/2021 0.2 (L)  0.3 - 1.2 mg/dL Final   GFR, Estimated 09/09/2021 >60  >60 mL/min Final   Comment: (NOTE) Calculated using the CKD-EPI Creatinine Equation (2021)    Anion gap 09/09/2021 10  5 - 15 Final   Performed at [facility], [place] [address],  [place], [place] [address]   WBC 09/09/2021 8.5  4.0 - 10.5 K/uL Final   RBC 09/09/2021 4.53  3.87 - 5.11 MIL/uL Final   Hemoglobin 09/09/2021 11.8 (L)  12.0 - 15.0 g/dL Final   HCT 09/09/2021 38.0  36.0 - 46.0 % Final   MCV 09/09/2021 83.9   80.0 - 100.0 fL Final   MCH 09/09/2021 26.0  26.0 - 34.0 pg Final   MCHC 09/09/2021 31.1  30.0 - 36.0 g/dL Final   RDW 09/09/2021 17.8 (H)  11.5 - 15.5 % Final   Platelets 09/09/2021 442 (H)  150 - 400 K/uL Final   nRBC 09/09/2021 0.0  0.0 - 0.2 % Final   Neutrophils Relative % 09/09/2021 70  % Final   Neutro Abs 09/09/2021 5.9  1.7 - 7.7 K/uL Final   Lymphocytes Relative 09/09/2021 20  % Final   Lymphs Abs 09/09/2021 1.7  0.7 - 4.0 K/uL Final   Monocytes Relative 09/09/2021 8  % Final   Monocytes Absolute 09/09/2021 0.7  0.1 - 1.0 K/uL Final   Eosinophils Relative 09/09/2021 1  % Final   Eosinophils Absolute 09/09/2021 0.1  0.0 - 0.5 K/uL Final   Basophils Relative 09/09/2021 1  % Final   Basophils Absolute 09/09/2021 0.1  0.0 - 0.1 K/uL Final   Immature Granulocytes 09/09/2021 0  % Final   Abs Immature Granulocytes 09/09/2021 0.03  0.00 - 0.07 K/uL Final   Performed at [facility], [place] [address], [place], [place] [address]   Ammonia 09/09/2021 37 (H)  9 - 35 umol/L Final   Comment: HEMOLYSIS AT THIS LEVEL MAY AFFECT RESULT Performed at [facility], [place] [address] [place], [place] [address]    Opiates 09/09/2021 NONE DETECTED  NONE DETECTED Final   Cocaine 09/09/2021 NONE DETECTED  NONE DETECTED Final   Benzodiazepines 09/09/2021 NONE DETECTED  NONE DETECTED Final   Amphetamines 09/09/2021 NONE DETECTED  NONE DETECTED Final   Tetrahydrocannabinol 09/09/2021 NONE DETECTED  NONE DETECTED Final   Barbiturates 09/09/2021 NONE DETECTED  NONE DETECTED Final   Comment: (NOTE) DRUG SCREEN FOR MEDICAL PURPOSES ONLY.  IF CONFIRMATION IS NEEDED FOR ANY PURPOSE, NOTIFY LAB WITHIN 5 DAYS.  LOWEST DETECTABLE LIMITS FOR URINE DRUG SCREEN Drug Class                     Cutoff (ng/mL) Amphetamine and metabolites    1000 Barbiturate and metabolites    200 Benzodiazepine                 177 Tricyclics and metabolites     300 Opiates and metabolites        300 Cocaine and  metabolites        300 THC                            50 Performed at [facility], [place] [address] [place], [place] [address]    Alcohol, Ethyl (B) 09/09/2021 <10  <10 mg/dL Final   Comment: (NOTE) Lowest detectable limit for serum alcohol is 10 mg/dL.  For medical purposes only. Performed at [facility], [place] [address] [place], [place] [address]    Lipase 09/09/2021 33  11 - 51 U/L Final   Performed at [facility] [address] [place], [place] [address]   Color, Urine 09/09/2021 COLORLESS (A)  YELLOW Final   APPearance 09/09/2021  CLEAR  CLEAR Final   Specific Gravity, Urine 09/09/2021 1.002 (L)  1.005 - 1.030 Final   pH 09/09/2021 6.0  5.0 - 8.0 Final   Glucose, UA 09/09/2021 NEGATIVE  NEGATIVE mg/dL Final   Hgb urine dipstick 09/09/2021 NEGATIVE  NEGATIVE Final   Bilirubin Urine 09/09/2021 NEGATIVE  NEGATIVE Final   Ketones, ur 09/09/2021 NEGATIVE  NEGATIVE mg/dL Final   Protein, ur 09/09/2021 NEGATIVE  NEGATIVE mg/dL Final   Nitrite 09/09/2021 NEGATIVE  NEGATIVE Final   Leukocytes,Ua 09/09/2021 NEGATIVE  NEGATIVE Final   Performed at [facility], [place] [address] [place], [place] [address]   Specimen Description 09/09/2021 URINE, CLEAN CATCH   Final   Special Requests 09/09/2021 NONE   Final   Culture 09/09/2021  (A)   Final                   Value:<10,000 COLONIES/mL INSIGNIFICANT GROWTH Performed at [facility], [place] [address] [place], [place] [address]    Report Status 09/09/2021 09/10/2021 FINAL   Final   D-Dimer, Quant 09/09/2021 0.92 (H)  0.00 - 0.50 ug/mL-FEU Final   Comment: (NOTE) At the manufacturer cut-off value of 0.5 g/mL FEU, this assay has a negative predictive value of 95-100%.This assay is intended for use in conjunction with a clinical pretest probability (PTP) assessment model to exclude pulmonary embolism (PE) and deep venous thrombosis (DVT) in outpatients suspected of PE or DVT. Results should be correlated  with clinical presentation. Performed at [facility], [place] [address] [place], [place] [address]    Troponin I (High Sensitivity) 09/09/2021 5  <18 ng/L Final   Comment: (NOTE) Elevated high sensitivity troponin I (hsTnI) values and significant  changes across serial measurements may suggest ACS but many other  chronic and acute conditions are known to elevate hsTnI results.  Refer to the "Links" section for chest pain algorithms and additional  guidance. Performed at [facility], [place] [address], [place], [place] [address]    BP 09/10/2021 135/83  mmHg Final   S' Lateral 09/10/2021 2.30  cm Final   Area-P 1/2 09/10/2021 5.58  cm2 Final    Blood Alcohol level:  Lab Results  Component Value Date   ETH <10 11/23/2021   ETH <10 51/70/0174    Metabolic Disorder Labs: Lab Results  Component Value Date   HGBA1C 7.0 (H) 10/04/2021   MPG 154.2 10/04/2021   Lab Results  Component Value Date   PROLACTIN 3.8 (L) 11/23/2021   Lab Results  Component Value Date   CHOL 171 11/23/2021   TRIG 125 11/23/2021   HDL 65 11/23/2021   CHOLHDL 2.6 11/23/2021   VLDL 25 11/23/2021   LDLCALC 81 11/23/2021   LDLCALC 95 10/04/2021    Therapeutic Lab Levels: No results found for: "LITHIUM" Lab Results  Component Value Date   VALPROATE 45 (L) 11/09/2021   VALPROATE 57 10/08/2021   No results found for: "CBMZ"  Physical Findings   PHQ2-9    Flowsheet Row ED from 11/23/2021 in [facility]  PHQ-2 Total Score 2  PHQ-9 Total Score 3      Flowsheet Row ED from 11/23/2021 in [facility] Most recent reading at 11/23/2021  6:44 PM ED from 11/22/2021 in [facility] Most recent reading at 11/22/2021  8:28 PM ED from 11/22/2021 in [facility] Most recent reading at 11/22/2021  1:24 AM  C-SSRS  RISK CATEGORY No Risk No Risk No Risk        Musculoskeletal   Strength & Muscle Tone: within normal limits Gait & Station: normal Patient leans: N/A  Psychiatric Specialty Exam  Presentation  General Appearance:  Appropriate for Environment  Eye Contact: Good  Speech: Clear and Coherent  Speech Volume: Normal  Handedness: Right   Mood and Affect  Mood: Euthymic  Affect: Congruent   Thought Process  Thought Processes: Coherent; Disorganized; Goal Directed  Descriptions of Associations:Intact  Orientation:Full (Time, Place and Person)  Thought Content:WDL; Logical  Diagnosis of Schizophrenia or Schizoaffective disorder in past: Yes  Duration of Psychotic Symptoms: No data recorded  Hallucinations:Hallucinations: None  Ideas of Reference:None  Suicidal Thoughts:Suicidal Thoughts: No  Homicidal Thoughts:Homicidal Thoughts: No   Sensorium  Memory: Immediate Fair; Recent Fair; Remote Fair  Judgment: Poor  Insight: Shallow   Executive Functions  Concentration: Fair  Attention Span: Fair  Recall: Good  Fund of Knowledge: Fair  Language: Fair   Psychomotor Activity  Psychomotor Activity: Psychomotor Activity: Normal   Assets  Assets: Communication Skills; Desire for Improvement; Financial Resources/Insurance; Resilience   Sleep  Sleep: Sleep: Good   No data recorded  Physical Exam  Blood pressure 125/78, pulse (!) 126, temperature (!) 97.5 F (36.4 C), temperature source Oral, resp. rate 18, SpO2 98 %. There is no height or weight on file to calculate BMI.  Physical Exam Vitals and nursing note reviewed.  Constitutional:      Appearance: Normal appearance. She is well-developed.  HENT:     Head: Normocephalic and atraumatic.     Nose: Nose normal.  Cardiovascular:     Rate and Rhythm: Normal rate.  Pulmonary:     Effort: Pulmonary effort is normal.  Neurological:     Mental Status: She is alert and oriented to person, place, and time.     Review of Systems   Constitutional: Negative.   HENT: Negative.    Eyes: Negative.   Respiratory: Negative.    Cardiovascular: Negative.   Gastrointestinal: Negative.   Genitourinary: Negative.   Musculoskeletal: Negative.   Skin: Negative.   Neurological: Negative.   Psychiatric/Behavioral: Negative.    Treatment Plan Summary: Continue to work with CSW and APS to obtain appropriate placement for patient.  Continue with medication management. Patient stable.  Molli Barrows, FNP 11/28/2021 9:16 PM

## 2021-11-28 NOTE — ED Notes (Signed)
Patient denies SI/HI and AVH. Patient ate breakfast and took her medications as scheduled. Patient has been calm and cooperative this morning.

## 2021-11-28 NOTE — ED Notes (Signed)
Pt sleeping at present, no distress noted.  Monitoring for safety. 

## 2021-11-28 NOTE — ED Notes (Signed)
Pt is taking a shower.

## 2021-11-28 NOTE — ED Notes (Signed)
Pt asleep at this hour. No apparent distress. RR even and unlabored. Monitored for safety.  

## 2021-11-28 NOTE — ED Notes (Addendum)
Progress note   D: Pt seen at the bedside. Pt denies SI, HI, AVH. Pt rates pain  8/10 as pain in her left leg. Pt denies any falls or injuries recently. Pt rates anxiety  0/10 and depression  0/10. Pt is pleasant and appropriate. Given education about food and drink choices in relation to her diabetes. Pt CBG=243 this evening. Pt asked for an orange juice but encouraged to have water instead. Pt verbalized understanding. No other complaints noted at this time.  A: Pt provided support and encouragement. Pt given scheduled medication as prescribed. PRNs as appropriate.   R: Pt safe on the unit. Will continue to monitor.

## 2021-11-29 LAB — GLUCOSE, CAPILLARY
Glucose-Capillary: 193 mg/dL — ABNORMAL HIGH (ref 70–99)
Glucose-Capillary: 164 mg/dL — ABNORMAL HIGH (ref 70–99)
Glucose-Capillary: 192 mg/dL — ABNORMAL HIGH (ref 70–99)
Glucose-Capillary: 150 mg/dL — ABNORMAL HIGH (ref 70–99)

## 2021-11-29 NOTE — ED Notes (Signed)
Pt's blood sugar is 193

## 2021-11-29 NOTE — ED Provider Notes (Signed)
Behavioral Health Progress Note  Date and Time: 11/29/2021 10:10 AM Name: Calleigh Murillo MRN:  [id number]  Subjective:    On reassessment, pt states "alright" euthymic mood. She denies feeling down, depressed, anxious, euphoric or other mood disturbances. She reports good appetite and sleep. She has been compliant with her medications. She is calm, cooperative, pleasant today. She denies SI/VI/HI, AVH, paranoia.   Social Work continues to work on discharge disposition.  Diagnosis:  Final diagnoses:  At risk for self care deficit  Noncompliance  Self-care deficit for medication administration  Schizophrenia, unspecified type ([place])   Total Time spent with patient: 20 minutes  Past Psychiatric History:  Past Medical History:  Past Medical History:  Diagnosis Date   Chronic obstructive pulmonary disease (COPD) ([place])    Glaucoma    Hyperlipidemia    Hypertension    Iron deficiency    Schizoaffective disorder ([place])    Type 2 diabetes mellitus ([place])     Past Surgical History:  Procedure Laterality Date   TUBAL LIGATION     Family History:  Family History  Problem Relation Age of Onset   Breast cancer Maternal Grandmother    Family Psychiatric  History: Unknown to pt Social History:  Social History   Substance and Sexual Activity  Alcohol Use Yes     Social History   Substance and Sexual Activity  Drug Use Not Currently    Social History   Socioeconomic History   Marital status: Divorced    Spouse name: Not on file   Number of children: Not on file   Years of education: Not on file   Highest education level: Not on file  Occupational History   Not on file  Tobacco Use   Smoking status: Every Day    Packs/day: 1.00    Types: Cigars, Cigarettes   Smokeless tobacco: Current  Vaping Use   Vaping Use: Never used  Substance and Sexual Activity   Alcohol use: Yes   Drug use: Not Currently   Sexual activity: Not Currently    Birth control/protection: Surgical   Other Topics Concern   Not on file  Social History Narrative   Not on file   Social Determinants of Health   Financial Resource Strain: Not on file  Food Insecurity: Not on file  Transportation Needs: Not on file  Physical Activity: Not on file  Stress: Not on file  Social Connections: Not on file   SDOH:  SDOH Screenings   Depression (PHQ2-9): Low Risk  (11/23/2021)  Tobacco Use: High Risk (11/23/2021)   Additional Social History:    Pain Medications: See MAR Prescriptions: See MAR Over the Counter: See MAR History of alcohol / drug use?: No history of alcohol / drug abuse Longest period of sobriety (when/how long): N/A                    Sleep: Good  Appetite:  Good  Current Medications:  Current Facility-Administered Medications  Medication Dose Route Frequency Provider Last Rate Last Admin   acetaminophen (TYLENOL) tablet 650 mg  650 mg Oral Q6H PRN Rankin, Shuvon B, NP   650 mg at 11/28/21 2015   albuterol (VENTOLIN HFA) 108 (90 Base) MCG/ACT inhaler 2 puff  2 puff Inhalation Q6H PRN Rankin, Shuvon B, NP       alum & mag hydroxide-simeth (MAALOX/MYLANTA) 200-200-20 MG/5ML suspension 30 mL  30 mL Oral Q4H PRN Rankin, Shuvon B, NP       apixaban (ELIQUIS)  tablet 5 mg  5 mg Oral BID Rankin, Shuvon B, NP   5 mg at 11/29/21 0955   cloZAPine (CLOZARIL) tablet 50 mg  50 mg Oral Daily [name], NP   50 mg at 11/29/21 0955   cloZAPine (CLOZARIL) tablet 75 mg  75 mg Oral Dorie Rank, NP   75 mg at 11/28/21 2107   diltiazem (CARDIZEM CD) 24 hr capsule 240 mg  240 mg Oral Daily Rankin, Shuvon B, NP   240 mg at 11/29/21 0954   divalproex (DEPAKOTE ER) 24 hr tablet 500 mg  500 mg Oral BID Rankin, Shuvon B, NP   500 mg at 11/29/21 0954   fluticasone furoate-vilanterol (BREO ELLIPTA) 200-25 MCG/ACT 1 puff  1 puff Inhalation Daily Rankin, Shuvon B, NP   1 puff at 11/29/21 0813   haloperidol (HALDOL) tablet 10 mg  10 mg Oral Q8H PRN Rankin, Shuvon B, NP        insulin aspart (novoLOG) injection 0-15 Units  0-15 Units Subcutaneous TID WC Rankin, Shuvon B, NP   3 Units at 11/29/21 0814   insulin glargine-yfgn (SEMGLEE) injection 12 Units  12 Units Subcutaneous Daily Rankin, Shuvon B, NP   12 Units at 11/29/21 0957   latanoprost (XALATAN) 0.005 % ophthalmic solution 1 drop  1 drop Both Eyes QHS Rankin, Shuvon B, NP   1 drop at 11/28/21 2108   magnesium hydroxide (MILK OF MAGNESIA) suspension 30 mL  30 mL Oral Daily PRN Rankin, Shuvon B, NP       metFORMIN (GLUCOPHAGE) tablet 500 mg  500 mg Oral BID WC Rankin, Shuvon B, NP   500 mg at 11/29/21 0813   mupirocin ointment (BACTROBAN) 2 % 1 Application  1 Application Topical TID Rankin, Shuvon B, NP   1 Application at 02/40/97 0957   nicotine (NICODERM CQ - dosed in mg/24 hr) patch 7 mg  7 mg Transdermal Daily Rankin, Shuvon B, NP   7 mg at 11/29/21 0956   [START ON 12/03/2021] Vitamin D (Ergocalciferol) (DRISDOL) 1.25 MG (50000 UNIT) capsule 50,000 Units  50,000 Units Oral Q7 days Rankin, Shuvon B, NP       Current Outpatient Medications  Medication Sig Dispense Refill   Accu-Chek Softclix Lancets lancets Use as directed up to four times daily 100 each 0   apixaban (ELIQUIS) 5 MG TABS tablet Take 1 tablet (5 mg total) by mouth 2 (two) times daily. 60 tablet 0   ARIPiprazole (ABILIFY) 10 MG tablet Take 1 tablet (10 mg total) by mouth daily. 30 tablet 0   Blood Glucose Monitoring Suppl (ACCU-CHEK GUIDE) w/Device KIT Use as directed up to four times daily 1 kit 0   budesonide-formoterol (SYMBICORT) 160-4.5 MCG/ACT inhaler Inhale 2 puffs into the lungs in the morning and at bedtime.     Cholecalciferol (VITAMIN D3) 1.25 MG (50000 UT) CAPS Take 50,000 Units by mouth every Thursday.     cloZAPine (CLOZARIL) 25 MG tablet Take 3 tablets (75 mg total) by mouth at bedtime. 90 tablet 0   clozapine (CLOZARIL) 50 MG tablet Take 1 tablet (50 mg total) by mouth daily. 30 tablet 0   diltiazem (CARDIZEM CD) 240 MG 24 hr capsule  Take 1 capsule (240 mg total) by mouth daily. (Patient not taking: Reported on 11/24/2021) 30 capsule 0   divalproex (DEPAKOTE ER) 500 MG 24 hr tablet Take 1 tablet (500 mg total) by mouth 2 (two) times daily. 60 tablet 0   glucose blood test strip  Use as directed up to four times daily 50 each 0   haloperidol (HALDOL) 10 MG tablet Take 1 tablet (10 mg total) by mouth 3 (three) times daily as needed for agitation (and psychotic symptoms).     INGREZZA 40 MG capsule Take 1 capsule (40 mg total) by mouth in the morning. 30 capsule 0   insulin aspart (NOVOLOG) 100 UNIT/ML FlexPen Before each meal 3 times a day, 140-199 - 2 units, 200-250 - 4 units, 251-299 - 6 units,  300-349 - 8 units,  350 or above 10 units. Insulin PEN if approved, provide syringes and needles if needed.Please switch to any approved short acting Insulin if needed. 15 mL 0   insulin glargine (LANTUS) 100 UNIT/ML Solostar Pen Inject 12 Units into the skin daily. 15 mL 0   Insulin Pen Needle 32G X 4 MM MISC Use 4 times a day with insulin, 1 month supply. 100 each 0   latanoprost (XALATAN) 0.005 % ophthalmic solution Place 1 drop into both eyes at bedtime.     metFORMIN (GLUCOPHAGE) 500 MG tablet Take 500 mg by mouth 2 (two) times daily with a meal.     nicotine (NICODERM CQ - DOSED IN MG/24 HR) 7 mg/24hr patch Place 1 patch (7 mg total) onto the skin daily. 28 patch 0   PROAIR HFA 108 (90 Base) MCG/ACT inhaler Inhale 2 puffs into the lungs every 6 (six) hours as needed for wheezing or shortness of breath.      Labs  Lab Results:  Admission on 11/23/2021  Component Date Value Ref Range Status   SARS Coronavirus 2 by RT PCR 11/23/2021 NEGATIVE  NEGATIVE Final   Comment: (NOTE) SARS-CoV-2 target nucleic acids are NOT DETECTED.  The SARS-CoV-2 RNA is generally detectable in upper respiratory specimens during the acute phase of infection. The lowest concentration of SARS-CoV-2 viral copies this assay can detect is 138 copies/mL. A  negative result does not preclude SARS-Cov-2 infection and should not be used as the sole basis for treatment or other patient management decisions. A negative result may occur with  improper specimen collection/handling, submission of specimen other than nasopharyngeal swab, presence of viral mutation(s) within the areas targeted by this assay, and inadequate number of viral copies(<138 copies/mL). A negative result must be combined with clinical observations, patient history, and epidemiological information. The expected result is Negative.  Fact Sheet for Patients:  EntrepreneurPulse.com.au  Fact Sheet for Healthcare Providers:  IncredibleEmployment.be  This test is no                          t yet approved or cleared by the [place] FDA and  has been authorized for detection and/or diagnosis of SARS-CoV-2 by FDA under an Emergency Use Authorization (EUA). This EUA will remain  in effect (meaning this test can be used) for the duration of the COVID-19 declaration under Section 564(b)(1) of the Act, 21 U.S.C.section 360bbb-3(b)(1), unless the authorization is terminated  or revoked sooner.       Influenza A by PCR 11/23/2021 NEGATIVE  NEGATIVE Final   Influenza B by PCR 11/23/2021 NEGATIVE  NEGATIVE Final   Comment: (NOTE) The Xpert Xpress SARS-CoV-2/FLU/RSV plus assay is intended as an aid in the diagnosis of influenza from Nasopharyngeal swab specimens and should not be used as a sole basis for treatment. Nasal washings and aspirates are unacceptable for Xpert Xpress SARS-CoV-2/FLU/RSV testing.  Fact Sheet for Patients: EntrepreneurPulse.com.au  Fact Sheet  for Healthcare Providers: IncredibleEmployment.be  This test is not yet approved or cleared by the [place] and has been authorized for detection and/or diagnosis of SARS-CoV-2 by FDA under an Emergency Use Authorization (EUA). This  EUA will remain in effect (meaning this test can be used) for the duration of the COVID-19 declaration under Section 564(b)(1) of the Act, 21 U.S.C. section 360bbb-3(b)(1), unless the authorization is terminated or revoked.  Performed at [facility], [place] [address] [place], [place] [address]    WBC 11/23/2021 7.4  4.0 - 10.5 K/uL Final   RBC 11/23/2021 4.25  3.87 - 5.11 MIL/uL Final   Hemoglobin 11/23/2021 11.1 (L)  12.0 - 15.0 g/dL Final   HCT 11/23/2021 34.6 (L)  36.0 - 46.0 % Final   MCV 11/23/2021 81.4  80.0 - 100.0 fL Final   MCH 11/23/2021 26.1  26.0 - 34.0 pg Final   MCHC 11/23/2021 32.1  30.0 - 36.0 g/dL Final   RDW 11/23/2021 17.7 (H)  11.5 - 15.5 % Final   Platelets 11/23/2021 365  150 - 400 K/uL Final   nRBC 11/23/2021 0.0  0.0 - 0.2 % Final   Neutrophils Relative % 11/23/2021 59  % Final   Neutro Abs 11/23/2021 4.3  1.7 - 7.7 K/uL Final   Lymphocytes Relative 11/23/2021 31  % Final   Lymphs Abs 11/23/2021 2.3  0.7 - 4.0 K/uL Final   Monocytes Relative 11/23/2021 7  % Final   Monocytes Absolute 11/23/2021 0.5  0.1 - 1.0 K/uL Final   Eosinophils Relative 11/23/2021 2  % Final   Eosinophils Absolute 11/23/2021 0.1  0.0 - 0.5 K/uL Final   Basophils Relative 11/23/2021 1  % Final   Basophils Absolute 11/23/2021 0.1  0.0 - 0.1 K/uL Final   Immature Granulocytes 11/23/2021 0  % Final   Abs Immature Granulocytes 11/23/2021 0.01  0.00 - 0.07 K/uL Final   Performed at [facility], [place] [address] [place], [place] [address]   Sodium 11/23/2021 139  135 - 145 mmol/L Final   Potassium 11/23/2021 4.1  3.5 - 5.1 mmol/L Final   Chloride 11/23/2021 105  98 - 111 mmol/L Final   CO2 11/23/2021 26  22 - 32 mmol/L Final   Glucose, Bld 11/23/2021 101 (H)  70 - 99 mg/dL Final   Glucose reference range applies only to samples taken after fasting for at least 8 hours.   BUN 11/23/2021 5 (L)  6 - 20 mg/dL Final   Creatinine, Ser 11/23/2021 0.51  0.44 - 1.00 mg/dL Final    Calcium 11/23/2021 9.0  8.9 - 10.3 mg/dL Final   Total Protein 11/23/2021 6.7  6.5 - 8.1 g/dL Final   Albumin 11/23/2021 3.6  3.5 - 5.0 g/dL Final   AST 11/23/2021 24  15 - 41 U/L Final   ALT 11/23/2021 18  0 - 44 U/L Final   Alkaline Phosphatase 11/23/2021 62  38 - 126 U/L Final   Total Bilirubin 11/23/2021 0.4  0.3 - 1.2 mg/dL Final   GFR, Estimated 11/23/2021 >60  >60 mL/min Final   Comment: (NOTE) Calculated using the CKD-EPI Creatinine Equation (2021)    Anion gap 11/23/2021 8  5 - 15 Final   Performed at [facility] [address], [place], [place] [address]   Magnesium 11/23/2021 2.0  1.7 - 2.4 mg/dL Final   Performed at [facility] [address], [place], [place] [address]   Alcohol,  Ethyl (B) 11/23/2021 <10  <10 mg/dL Final   Comment: (NOTE) Lowest detectable limit for serum alcohol is 10 mg/dL.  For medical purposes only. Performed at [facility], [place] [address] [place], [place] [address]    Prolactin 11/23/2021 3.8 (L)  4.8 - 23.3 ng/mL Final   Comment: (NOTE) Performed At: [facility] [place], [place] [id number] Rush Farmer MD KG:[id number]    Color, Urine 11/23/2021 YELLOW  YELLOW Final   APPearance 11/23/2021 CLEAR  CLEAR Final   Specific Gravity, Urine 11/23/2021 1.025  1.005 - 1.030 Final   pH 11/23/2021 6.5  5.0 - 8.0 Final   Glucose, UA 11/23/2021 NEGATIVE  NEGATIVE mg/dL Final   Hgb urine dipstick 11/23/2021 NEGATIVE  NEGATIVE Final   Bilirubin Urine 11/23/2021 NEGATIVE  NEGATIVE Final   Ketones, ur 11/23/2021 NEGATIVE  NEGATIVE mg/dL Final   Protein, ur 11/23/2021 NEGATIVE  NEGATIVE mg/dL Final   Nitrite 11/23/2021 NEGATIVE  NEGATIVE Final   Leukocytes,Ua 11/23/2021 NEGATIVE  NEGATIVE Final   Microscopic not done on urines with negative protein, blood, leukocytes, nitrite, or glucose < 500 mg/dL.   RBC / HPF 11/23/2021 0-5  0 - 5 RBC/hpf Final   WBC, UA 11/23/2021 0-5  0 - 5 WBC/hpf Final   Bacteria, UA  11/23/2021 NONE SEEN  NONE SEEN Final   Squamous Epithelial / LPF 11/23/2021 0-5  0 - 5 Final   Mucus 11/23/2021 PRESENT   Final   Performed at [facility], [place] [address] [place], [place] [address]   POC Amphetamine UR 11/23/2021 None Detected  NONE DETECTED (Cut Off Level 1000 ng/mL) Final   POC Secobarbital (BAR) 11/23/2021 None Detected  NONE DETECTED (Cut Off Level 300 ng/mL) Final   POC Buprenorphine (BUP) 11/23/2021 None Detected  NONE DETECTED (Cut Off Level 10 ng/mL) Final   POC Oxazepam (BZO) 11/23/2021 None Detected  NONE DETECTED (Cut Off Level 300 ng/mL) Final   POC Cocaine UR 11/23/2021 None Detected  NONE DETECTED (Cut Off Level 300 ng/mL) Final   POC Methamphetamine UR 11/23/2021 None Detected  NONE DETECTED (Cut Off Level 1000 ng/mL) Final   POC Morphine 11/23/2021 None Detected  NONE DETECTED (Cut Off Level 300 ng/mL) Final   POC Methadone UR 11/23/2021 None Detected  NONE DETECTED (Cut Off Level 300 ng/mL) Final   POC Oxycodone UR 11/23/2021 None Detected  NONE DETECTED (Cut Off Level 100 ng/mL) Final   POC Marijuana UR 11/23/2021 None Detected  NONE DETECTED (Cut Off Level 50 ng/mL) Final   SARSCOV2ONAVIRUS 2 AG 11/23/2021 NEGATIVE  NEGATIVE Final   Comment: (NOTE) SARS-CoV-2 antigen NOT DETECTED.   Negative results are presumptive.  Negative results do not preclude SARS-CoV-2 infection and should not be used as the sole basis for treatment or other patient management decisions, including infection  control decisions, particularly in the presence of clinical signs and  symptoms consistent with COVID-19, or in those who have been in contact with the virus.  Negative results must be combined with clinical observations, patient history, and epidemiological information. The expected result is Negative.  Fact Sheet for Patients: HandmadeRecipes.com.cy  Fact Sheet for Healthcare Providers: FuneralLife.at  This test is  not yet approved or cleared by the [place] FDA and  has been authorized for detection and/or diagnosis of SARS-CoV-2 by FDA under an Emergency Use Authorization (EUA).  This EUA will remain in effect (meaning this test can be used) for the duration of  the COV  ID-19 declaration under Section 564(b)(1) of the Act, 21 U.S.C. section 360bbb-3(b)(1), unless the authorization is terminated or revoked sooner.     Cholesterol 11/23/2021 171  0 - 200 mg/dL Final   Triglycerides 11/23/2021 125  <150 mg/dL Final   HDL 11/23/2021 65  >40 mg/dL Final   Total CHOL/HDL Ratio 11/23/2021 2.6  RATIO Final   VLDL 11/23/2021 25  0 - 40 mg/dL Final   LDL Cholesterol 11/23/2021 81  0 - 99 mg/dL Final   Comment:        Total Cholesterol/HDL:CHD Risk Coronary Heart Disease Risk Table                     Men   Women  1/2 Average Risk   3.4   3.3  Average Risk       5.0   4.4  2 X Average Risk   9.6   7.1  3 X Average Risk  23.4   11.0        Use the calculated Patient Ratio above and the CHD Risk Table to determine the patient's CHD Risk.        ATP III CLASSIFICATION (LDL):  <100     mg/dL   Optimal  100-129  mg/dL   Near or Above                    Optimal  130-159  mg/dL   Borderline  160-189  mg/dL   High  >190     mg/dL   Very High Performed at [facility] [address] [place], [place] [address]    TSH 11/23/2021 1.788  0.350 - 4.500 uIU/mL Final   Comment: Performed by a 3rd Generation assay with a functional sensitivity of <=0.01 uIU/mL. Performed at [facility], [place] [address], [place], [place] [address]    Glucose-Capillary 11/23/2021 137 (H)  70 - 99 mg/dL Final   Glucose reference range applies only to samples taken after fasting for at least 8 hours.   Glucose-Capillary 11/24/2021 144 (H)  70 - 99 mg/dL Final   Glucose reference range applies only to samples taken after fasting for at least 8 hours.   Glucose-Capillary 11/24/2021 87   70 - 99 mg/dL Final   Glucose reference range applies only to samples taken after fasting for at least 8 hours.   Glucose-Capillary 11/24/2021 269 (H)  70 - 99 mg/dL Final   Glucose reference range applies only to samples taken after fasting for at least 8 hours.   Glucose-Capillary 11/25/2021 112 (H)  70 - 99 mg/dL Final   Glucose reference range applies only to samples taken after fasting for at least 8 hours.   Glucose-Capillary 11/25/2021 136 (H)  70 - 99 mg/dL Final   Glucose reference range applies only to samples taken after fasting for at least 8 hours.   Glucose-Capillary 11/25/2021 187 (H)  70 - 99 mg/dL Final   Glucose reference range applies only to samples taken after fasting for at least 8 hours.   Glucose-Capillary 11/25/2021 159 (H)  70 - 99 mg/dL Final   Glucose reference range applies only to samples taken after fasting for at least 8 hours.   Glucose-Capillary 11/26/2021 122 (H)  70 - 99 mg/dL Final   Glucose reference range applies only to samples taken after fasting for at least 8 hours.   Glucose-Capillary 11/26/2021 140 (H)  70 - 99 mg/dL Final   Glucose  reference range applies only to samples taken after fasting for at least 8 hours.   Glucose-Capillary 11/26/2021 102 (H)  70 - 99 mg/dL Final   Glucose reference range applies only to samples taken after fasting for at least 8 hours.   Glucose-Capillary 11/26/2021 166 (H)  70 - 99 mg/dL Final   Glucose reference range applies only to samples taken after fasting for at least 8 hours.   Glucose-Capillary 11/27/2021 133 (H)  70 - 99 mg/dL Final   Glucose reference range applies only to samples taken after fasting for at least 8 hours.   Glucose-Capillary 11/27/2021 146 (H)  70 - 99 mg/dL Final   Glucose reference range applies only to samples taken after fasting for at least 8 hours.   Glucose-Capillary 11/27/2021 144 (H)  70 - 99 mg/dL Final   Glucose reference range applies only to samples taken after fasting for at  least 8 hours.   Glucose-Capillary 11/27/2021 154 (H)  70 - 99 mg/dL Final   Glucose reference range applies only to samples taken after fasting for at least 8 hours.   Glucose-Capillary 11/28/2021 107 (H)  70 - 99 mg/dL Final   Glucose reference range applies only to samples taken after fasting for at least 8 hours.   Glucose-Capillary 11/28/2021 166 (H)  70 - 99 mg/dL Final   Glucose reference range applies only to samples taken after fasting for at least 8 hours.   Glucose-Capillary 11/28/2021 89  70 - 99 mg/dL Final   Glucose reference range applies only to samples taken after fasting for at least 8 hours.   Comment 1 11/28/2021 NURRP   Final   Glucose-Capillary 11/28/2021 243 (H)  70 - 99 mg/dL Final   Glucose reference range applies only to samples taken after fasting for at least 8 hours.   Glucose-Capillary 11/29/2021 193 (H)  70 - 99 mg/dL Final   Glucose reference range applies only to samples taken after fasting for at least 8 hours.  Admission on 11/22/2021, Discharged on 11/22/2021  Component Date Value Ref Range Status   Glucose-Capillary 11/22/2021 169 (H)  70 - 99 mg/dL Final   Glucose reference range applies only to samples taken after fasting for at least 8 hours.  No results displayed because visit has over 200 results.    Admission on 10/04/2021, Discharged on 10/22/2021  Component Date Value Ref Range Status   SARS Coronavirus 2 by RT PCR 10/04/2021 NEGATIVE  NEGATIVE Final   Comment: (NOTE) SARS-CoV-2 target nucleic acids are NOT DETECTED.  The SARS-CoV-2 RNA is generally detectable in upper respiratory specimens during the acute phase of infection. The lowest concentration of SARS-CoV-2 viral copies this assay can detect is 138 copies/mL. A negative result does not preclude SARS-Cov-2 infection and should not be used as the sole basis for treatment or other patient management decisions. A negative result may occur with  improper specimen collection/handling,  submission of specimen other than nasopharyngeal swab, presence of viral mutation(s) within the areas targeted by this assay, and inadequate number of viral copies(<138 copies/mL). A negative result must be combined with clinical observations, patient history, and epidemiological information. The expected result is Negative.  Fact Sheet for Patients:  EntrepreneurPulse.com.au  Fact Sheet for Healthcare Providers:  IncredibleEmployment.be  This test is no                          t yet approved or cleared by the [place] FDA  and  has been authorized for detection and/or diagnosis of SARS-CoV-2 by FDA under an Emergency Use Authorization (EUA). This EUA will remain  in effect (meaning this test can be used) for the duration of the COVID-19 declaration under Section 564(b)(1) of the Act, 21 U.S.C.section 360bbb-3(b)(1), unless the authorization is terminated  or revoked sooner.       Influenza A by PCR 10/04/2021 NEGATIVE  NEGATIVE Final   Influenza B by PCR 10/04/2021 NEGATIVE  NEGATIVE Final   Comment: (NOTE) The Xpert Xpress SARS-CoV-2/FLU/RSV plus assay is intended as an aid in the diagnosis of influenza from Nasopharyngeal swab specimens and should not be used as a sole basis for treatment. Nasal washings and aspirates are unacceptable for Xpert Xpress SARS-CoV-2/FLU/RSV testing.  Fact Sheet for Patients: EntrepreneurPulse.com.au  Fact Sheet for Healthcare Providers: IncredibleEmployment.be  This test is not yet approved or cleared by the [place] FDA and has been authorized for detection and/or diagnosis of SARS-CoV-2 by FDA under an Emergency Use Authorization (EUA). This EUA will remain in effect (meaning this test can be used) for the duration of the COVID-19 declaration under Section 564(b)(1) of the Act, 21 U.S.C. section 360bbb-3(b)(1), unless the authorization is terminated  or revoked.  Performed at [facility], [place] [address], [place], [place] [address]    WBC 10/04/2021 8.4  4.0 - 10.5 K/uL Final   RBC 10/04/2021 4.42  3.87 - 5.11 MIL/uL Final   Hemoglobin 10/04/2021 11.6 (L)  12.0 - 15.0 g/dL Final   HCT 10/04/2021 35.7 (L)  36.0 - 46.0 % Final   MCV 10/04/2021 80.8  80.0 - 100.0 fL Final   MCH 10/04/2021 26.2  26.0 - 34.0 pg Final   MCHC 10/04/2021 32.5  30.0 - 36.0 g/dL Final   RDW 10/04/2021 16.0 (H)  11.5 - 15.5 % Final   Platelets 10/04/2021 372  150 - 400 K/uL Final   nRBC 10/04/2021 0.0  0.0 - 0.2 % Final   Neutrophils Relative % 10/04/2021 68  % Final   Neutro Abs 10/04/2021 5.7  1.7 - 7.7 K/uL Final   Lymphocytes Relative 10/04/2021 22  % Final   Lymphs Abs 10/04/2021 1.8  0.7 - 4.0 K/uL Final   Monocytes Relative 10/04/2021 8  % Final   Monocytes Absolute 10/04/2021 0.7  0.1 - 1.0 K/uL Final   Eosinophils Relative 10/04/2021 1  % Final   Eosinophils Absolute 10/04/2021 0.1  0.0 - 0.5 K/uL Final   Basophils Relative 10/04/2021 1  % Final   Basophils Absolute 10/04/2021 0.1  0.0 - 0.1 K/uL Final   Immature Granulocytes 10/04/2021 0  % Final   Abs Immature Granulocytes 10/04/2021 0.03  0.00 - 0.07 K/uL Final   Performed at [facility], [place] [address] [place], [place] [address]   Sodium 10/04/2021 136  135 - 145 mmol/L Final   Potassium 10/04/2021 4.2  3.5 - 5.1 mmol/L Final   Chloride 10/04/2021 104  98 - 111 mmol/L Final   CO2 10/04/2021 25  22 - 32 mmol/L Final   Glucose, Bld 10/04/2021 100 (H)  70 - 99 mg/dL Final   Glucose reference range applies only to samples taken after fasting for at least 8 hours.   BUN 10/04/2021 9  6 - 20 mg/dL Final   Creatinine, Ser 10/04/2021 0.52  0.44 - 1.00 mg/dL Final   Calcium 10/04/2021 9.0  8.9 - 10.3 mg/dL Final   Total Protein 10/04/2021 7.0  6.5 - 8.1  g/dL Final   Albumin 10/04/2021 3.2 (L)  3.5 - 5.0 g/dL Final   AST 10/04/2021 13 (L)  15 - 41 U/L Final   ALT 10/04/2021 10  0 -  44 U/L Final   Alkaline Phosphatase 10/04/2021 61  38 - 126 U/L Final   Total Bilirubin 10/04/2021 0.3  0.3 - 1.2 mg/dL Final   GFR, Estimated 10/04/2021 >60  >60 mL/min Final   Comment: (NOTE) Calculated using the CKD-EPI Creatinine Equation (2021)    Anion gap 10/04/2021 7  5 - 15 Final   Performed at [facility] [address] [place], [place] [address]   Hgb A1c MFr Bld 10/04/2021 7.0 (H)  4.8 - 5.6 % Final   Comment: (NOTE) Pre diabetes:          5.7%-6.4%  Diabetes:              >6.4%  Glycemic control for   <7.0% adults with diabetes    Mean Plasma Glucose 10/04/2021 154.2  mg/dL Final   Performed at [facility], [place] [address], [place], [place] [address]   Cholesterol 10/04/2021 178  0 - 200 mg/dL Final   Triglycerides 10/04/2021 155 (H)  <150 mg/dL Final   HDL 10/04/2021 52  >40 mg/dL Final   Total CHOL/HDL Ratio 10/04/2021 3.4  RATIO Final   VLDL 10/04/2021 31  0 - 40 mg/dL Final   LDL Cholesterol 10/04/2021 95  0 - 99 mg/dL Final   Comment:        Total Cholesterol/HDL:CHD Risk Coronary Heart Disease Risk Table                     Men   Women  1/2 Average Risk   3.4   3.3  Average Risk       5.0   4.4  2 X Average Risk   9.6   7.1  3 X Average Risk  23.4   11.0        Use the calculated Patient Ratio above and the CHD Risk Table to determine the patient's CHD Risk.        ATP III CLASSIFICATION (LDL):  <100     mg/dL   Optimal  100-129  mg/dL   Near or Above                    Optimal  130-159  mg/dL   Borderline  160-189  mg/dL   High  >190     mg/dL   Very High Performed at [facility] [address] [place], [place] [address]    POC Amphetamine UR 10/04/2021 None Detected  NONE DETECTED (Cut Off Level 1000 ng/mL) Final   POC Secobarbital (BAR) 10/04/2021 None Detected  NONE DETECTED (Cut Off Level 300 ng/mL) Final   POC Buprenorphine (BUP) 10/04/2021 None Detected  NONE DETECTED (Cut Off Level 10 ng/mL) Final   POC Oxazepam  (BZO) 10/04/2021 None Detected  NONE DETECTED (Cut Off Level 300 ng/mL) Final   POC Cocaine UR 10/04/2021 None Detected  NONE DETECTED (Cut Off Level 300 ng/mL) Final   POC Methamphetamine UR 10/04/2021 None Detected  NONE DETECTED (Cut Off Level 1000 ng/mL) Final   POC Morphine 10/04/2021 None Detected  NONE DETECTED (Cut Off Level 300 ng/mL) Final   POC Methadone UR 10/04/2021 None Detected  NONE DETECTED (Cut Off Level 300 ng/mL) Final   POC Oxycodone UR 10/04/2021 Positive (A)  NONE DETECTED (Cut Off Level 100 ng/mL) Final   POC Marijuana UR 10/04/2021 None Detected  NONE DETECTED (Cut Off Level 50 ng/mL) Final   SARSCOV2ONAVIRUS 2 AG 10/04/2021 NEGATIVE  NEGATIVE Final   Comment: (NOTE) SARS-CoV-2 antigen NOT DETECTED.   Negative results are presumptive.  Negative results do not preclude SARS-CoV-2 infection and should not be used as the sole basis for treatment or other patient management decisions, including infection  control decisions, particularly in the presence of clinical signs and  symptoms consistent with COVID-19, or in those who have been in contact with the virus.  Negative results must be combined with clinical observations, patient history, and epidemiological information. The expected result is Negative.  Fact Sheet for Patients: HandmadeRecipes.com.cy  Fact Sheet for Healthcare Providers: FuneralLife.at  This test is not yet approved or cleared by the [place] FDA and  has been authorized for detection and/or diagnosis of SARS-CoV-2 by FDA under an Emergency Use Authorization (EUA).  This EUA will remain in effect (meaning this test can be used) for the duration of  the COV                          ID-19 declaration under Section 564(b)(1) of the Act, 21 U.S.C. section 360bbb-3(b)(1), unless the authorization is terminated or revoked sooner.     Valproic Acid Lvl 10/04/2021 51  50.0 - 100.0 ug/mL Final    Performed at [facility] [address] [place], [place] [address]   Valproic Acid Lvl 10/08/2021 57  50.0 - 100.0 ug/mL Final   Performed at [facility] [address], [place], [place] [address]   Glucose-Capillary 10/09/2021 123 (H)  70 - 99 mg/dL Final   Glucose reference range applies only to samples taken after fasting for at least 8 hours.  Admission on 09/09/2021, Discharged on 09/10/2021  Component Date Value Ref Range Status   Sodium 09/09/2021 134 (L)  135 - 145 mmol/L Final   Potassium 09/09/2021 4.3  3.5 - 5.1 mmol/L Final   Chloride 09/09/2021 99  98 - 111 mmol/L Final   CO2 09/09/2021 25  22 - 32 mmol/L Final   Glucose, Bld 09/09/2021 107 (H)  70 - 99 mg/dL Final   Glucose reference range applies only to samples taken after fasting for at least 8 hours.   BUN 09/09/2021 12  6 - 20 mg/dL Final   Creatinine, Ser 09/09/2021 0.74  0.44 - 1.00 mg/dL Final   Calcium 09/09/2021 9.0  8.9 - 10.3 mg/dL Final   Total Protein 09/09/2021 7.4  6.5 - 8.1 g/dL Final   Albumin 09/09/2021 3.1 (L)  3.5 - 5.0 g/dL Final   AST 09/09/2021 13 (L)  15 - 41 U/L Final   ALT 09/09/2021 13  0 - 44 U/L Final   Alkaline Phosphatase 09/09/2021 66  38 - 126 U/L Final   Total Bilirubin 09/09/2021 0.2 (L)  0.3 - 1.2 mg/dL Final   GFR, Estimated 09/09/2021 >60  >60 mL/min Final   Comment: (NOTE) Calculated using the CKD-EPI Creatinine Equation (2021)    Anion gap 09/09/2021 10  5 - 15 Final   Performed at [facility], [place] [address] [place], [place] [address]   WBC 09/09/2021 8.5  4.0 - 10.5 K/uL Final   RBC 09/09/2021 4.53  3.87 - 5.11 MIL/uL Final   Hemoglobin 09/09/2021 11.8 (L)  12.0 - 15.0 g/dL Final   HCT  09/09/2021 38.0  36.0 - 46.0 % Final   MCV 09/09/2021 83.9  80.0 - 100.0 fL Final   MCH 09/09/2021 26.0  26.0 - 34.0 pg Final   MCHC 09/09/2021 31.1  30.0 - 36.0 g/dL Final   RDW 09/09/2021 17.8 (H)  11.5 - 15.5 % Final   Platelets 09/09/2021 442 (H)  150 - 400 K/uL  Final   nRBC 09/09/2021 0.0  0.0 - 0.2 % Final   Neutrophils Relative % 09/09/2021 70  % Final   Neutro Abs 09/09/2021 5.9  1.7 - 7.7 K/uL Final   Lymphocytes Relative 09/09/2021 20  % Final   Lymphs Abs 09/09/2021 1.7  0.7 - 4.0 K/uL Final   Monocytes Relative 09/09/2021 8  % Final   Monocytes Absolute 09/09/2021 0.7  0.1 - 1.0 K/uL Final   Eosinophils Relative 09/09/2021 1  % Final   Eosinophils Absolute 09/09/2021 0.1  0.0 - 0.5 K/uL Final   Basophils Relative 09/09/2021 1  % Final   Basophils Absolute 09/09/2021 0.1  0.0 - 0.1 K/uL Final   Immature Granulocytes 09/09/2021 0  % Final   Abs Immature Granulocytes 09/09/2021 0.03  0.00 - 0.07 K/uL Final   Performed at [facility], [place] [address], [place], [place] [address]   Ammonia 09/09/2021 37 (H)  9 - 35 umol/L Final   Comment: HEMOLYSIS AT THIS LEVEL MAY AFFECT RESULT Performed at [facility], [place] [address], [place], [place] [address]    Opiates 09/09/2021 NONE DETECTED  NONE DETECTED Final   Cocaine 09/09/2021 NONE DETECTED  NONE DETECTED Final   Benzodiazepines 09/09/2021 NONE DETECTED  NONE DETECTED Final   Amphetamines 09/09/2021 NONE DETECTED  NONE DETECTED Final   Tetrahydrocannabinol 09/09/2021 NONE DETECTED  NONE DETECTED Final   Barbiturates 09/09/2021 NONE DETECTED  NONE DETECTED Final   Comment: (NOTE) DRUG SCREEN FOR MEDICAL PURPOSES ONLY.  IF CONFIRMATION IS NEEDED FOR ANY PURPOSE, NOTIFY LAB WITHIN 5 DAYS.  LOWEST DETECTABLE LIMITS FOR URINE DRUG SCREEN Drug Class                     Cutoff (ng/mL) Amphetamine and metabolites    1000 Barbiturate and metabolites    200 Benzodiazepine                 063 Tricyclics and metabolites     300 Opiates and metabolites        300 Cocaine and metabolites        300 THC                            50 Performed at [facility], [place] [address], [place], [place] [address]    Alcohol, Ethyl (B) 09/09/2021 <10  <10 mg/dL Final   Comment:  (NOTE) Lowest detectable limit for serum alcohol is 10 mg/dL.  For medical purposes only. Performed at [facility], [place] [address] [place], [place] [address]    Lipase 09/09/2021 33  11 - 51 U/L Final   Performed at [facility] [address], [place], [place] [address]   Color, Urine 09/09/2021 COLORLESS (A)  YELLOW Final   APPearance 09/09/2021 CLEAR  CLEAR Final   Specific Gravity, Urine 09/09/2021 1.002 (L)  1.005 - 1.030 Final   pH 09/09/2021 6.0  5.0 - 8.0 Final   Glucose, UA 09/09/2021 NEGATIVE  NEGATIVE mg/dL Final   Hgb  urine dipstick 09/09/2021 NEGATIVE  NEGATIVE Final   Bilirubin Urine 09/09/2021 NEGATIVE  NEGATIVE Final   Ketones, ur 09/09/2021 NEGATIVE  NEGATIVE mg/dL Final   Protein, ur 09/09/2021 NEGATIVE  NEGATIVE mg/dL Final   Nitrite 09/09/2021 NEGATIVE  NEGATIVE Final   Leukocytes,Ua 09/09/2021 NEGATIVE  NEGATIVE Final   Performed at [facility], [place] [address] [place], [place] [address]   Specimen Description 09/09/2021 URINE, CLEAN CATCH   Final   Special Requests 09/09/2021 NONE   Final   Culture 09/09/2021  (A)   Final                   Value:<10,000 COLONIES/mL INSIGNIFICANT GROWTH Performed at [facility], [place] [address] [place], [place] [address]    Report Status 09/09/2021 09/10/2021 FINAL   Final   D-Dimer, Quant 09/09/2021 0.92 (H)  0.00 - 0.50 ug/mL-FEU Final   Comment: (NOTE) At the manufacturer cut-off value of 0.5 g/mL FEU, this assay has a negative predictive value of 95-100%.This assay is intended for use in conjunction with a clinical pretest probability (PTP) assessment model to exclude pulmonary embolism (PE) and deep venous thrombosis (DVT) in outpatients suspected of PE or DVT. Results should be correlated with clinical presentation. Performed at [facility], [place] [address] [place], [place] [address]    Troponin I (High Sensitivity) 09/09/2021 5  <18 ng/L Final   Comment: (NOTE) Elevated high  sensitivity troponin I (hsTnI) values and significant  changes across serial measurements may suggest ACS but many other  chronic and acute conditions are known to elevate hsTnI results.  Refer to the "Links" section for chest pain algorithms and additional  guidance. Performed at [facility], [place] [address], [place], [place] [address]    BP 09/10/2021 135/83  mmHg Final   S' Lateral 09/10/2021 2.30  cm Final   Area-P 1/2 09/10/2021 5.58  cm2 Final    Blood Alcohol level:  Lab Results  Component Value Date   ETH <10 11/23/2021   ETH <10 53/61/4431    Metabolic Disorder Labs: Lab Results  Component Value Date   HGBA1C 7.0 (H) 10/04/2021   MPG 154.2 10/04/2021   Lab Results  Component Value Date   PROLACTIN 3.8 (L) 11/23/2021   Lab Results  Component Value Date   CHOL 171 11/23/2021   TRIG 125 11/23/2021   HDL 65 11/23/2021   CHOLHDL 2.6 11/23/2021   VLDL 25 11/23/2021   LDLCALC 81 11/23/2021   LDLCALC 95 10/04/2021    Therapeutic Lab Levels: No results found for: "LITHIUM" Lab Results  Component Value Date   VALPROATE 45 (L) 11/09/2021   VALPROATE 57 10/08/2021   No results found for: "CBMZ"  Physical Findings   PHQ2-9    Flowsheet Row ED from 11/23/2021 in [facility]  PHQ-2 Total Score 2  PHQ-9 Total Score 3      Flowsheet Row ED from 11/23/2021 in [facility] Most recent reading at 11/23/2021  6:44 PM ED from 11/22/2021 in [facility] Most recent reading at 11/22/2021  8:28 PM ED from 11/22/2021 in [facility] Most recent reading at 11/22/2021  1:24 AM  C-SSRS RISK CATEGORY No Risk No Risk No Risk        Musculoskeletal  Strength & Muscle Tone: within normal limits Gait & Station: normal Patient leans: N/A  Psychiatric Specialty Exam  Presentation  General Appearance:  Appropriate for Environment  Eye  Contact: Good  Speech: Clear and Coherent  Speech Volume: Normal  Handedness: Right   Mood and Affect  Mood: -- ("alright")  Affect: Flat   Thought Process  Thought Processes: Coherent  Descriptions of Associations:Intact  Orientation:Full (Time, Place and Person)  Thought Content:Logical  Diagnosis of Schizophrenia or Schizoaffective disorder in past: Yes  Duration of Psychotic Symptoms: No data recorded  Hallucinations:Hallucinations: None  Ideas of Reference:None  Suicidal Thoughts:Suicidal Thoughts: No  Homicidal Thoughts:Homicidal Thoughts: No   Sensorium  Memory: Immediate Fair  Judgment: Intact  Insight: Shallow   Executive Functions  Concentration: Fair  Attention Span: Fair  Recall: Page of Knowledge: Fair  Language: Fair   Psychomotor Activity  Psychomotor Activity: Psychomotor Activity: Normal   Assets  Assets: Communication Skills; Desire for Improvement; Financial Resources/Insurance   Sleep  Sleep: Sleep: Good   No data recorded  Physical Exam  Physical Exam Cardiovascular:     Rate and Rhythm: Normal rate.  Pulmonary:     Effort: Pulmonary effort is normal.  Neurological:     Mental Status: She is alert and oriented to person, place, and time.  Psychiatric:        Attention and Perception: Attention and perception normal.        Mood and Affect: Mood normal. Affect is flat.        Speech: Speech normal.        Behavior: Behavior normal. Behavior is cooperative.        Thought Content: Thought content normal.    Review of Systems  Constitutional:  Negative for chills and fever.  Respiratory:  Negative for shortness of breath.   Cardiovascular:  Negative for chest pain and palpitations.  Gastrointestinal:  Negative for abdominal pain.  Neurological:  Negative for headaches.  Psychiatric/Behavioral: Negative.     Blood pressure (!) 127/94, pulse 100, temperature 98.2 F (36.8 C),  temperature source Oral, resp. rate 20, SpO2 100 %. There is no height or weight on file to calculate BMI.  Treatment Plan Summary: Plan SW is working on discharge disposition  Tharon Aquas, NP 11/29/2021 10:10 AM

## 2021-11-29 NOTE — ED Notes (Signed)
Patient ate breakfast this morning and is in a good mood. Patient denies SI/HI and AVH. Patient is being monitored for safety.

## 2021-11-29 NOTE — ED Notes (Signed)
Patient currently lying in bed quietly, no distress noted, will continue to monitor patient for safety

## 2021-11-29 NOTE — ED Notes (Signed)
Pt asleep at this hour. No apparent distress. RR even and unlabored. Monitored for safety.  

## 2021-11-29 NOTE — ED Notes (Signed)
Pt sleeping at present, no distress noted.  Monitoring for safety. 

## 2021-11-29 NOTE — ED Notes (Signed)
Snack given.

## 2021-11-29 NOTE — ED Notes (Signed)
Pt asked if she could have a snack. Advised patient that snacks will be given at 3 pm

## 2021-11-29 NOTE — ED Notes (Signed)
Breakfast given to patient.

## 2021-11-29 NOTE — ED Notes (Signed)
Pt quietly lying in bed with eyes closed, no distress noted, will continue to monitor patient for safety.

## 2021-11-29 NOTE — ED Notes (Signed)
Patient has been given snack

## 2021-11-29 NOTE — ED Notes (Signed)
Patients blood sugar is 192

## 2021-11-29 NOTE — ED Notes (Signed)
Patients blood sugar is 164

## 2021-11-29 NOTE — ED Notes (Signed)
Patient laying in bed - no sxs of distress.  Denies SI, HI and AVH - will continue to monitor for safety

## 2021-11-29 NOTE — ED Notes (Signed)
Pt is currently sleeping, no distress noted, environmental check complete, will continue to monitor patient for safety. ? ?

## 2021-11-29 NOTE — ED Notes (Signed)
Snacks given 

## 2021-11-30 ENCOUNTER — Encounter (HOSPITAL_COMMUNITY): Payer: Self-pay | Admitting: Registered Nurse

## 2021-11-30 LAB — GLUCOSE, CAPILLARY
Glucose-Capillary: 120 mg/dL — ABNORMAL HIGH (ref 70–99)
Glucose-Capillary: 86 mg/dL (ref 70–99)
Glucose-Capillary: 123 mg/dL — ABNORMAL HIGH (ref 70–99)
Glucose-Capillary: 161 mg/dL — ABNORMAL HIGH (ref 70–99)

## 2021-11-30 LAB — CBC WITH DIFFERENTIAL/PLATELET
Abs Immature Granulocytes: 0.01 10*3/uL (ref 0.00–0.07)
Basophils Absolute: 0.1 10*3/uL (ref 0.0–0.1)
Basophils Relative: 1 %
Eosinophils Absolute: 0.1 10*3/uL (ref 0.0–0.5)
Eosinophils Relative: 2 %
HCT: 35.2 % — ABNORMAL LOW (ref 36.0–46.0)
Hemoglobin: 11.3 g/dL — ABNORMAL LOW (ref 12.0–15.0)
Immature Granulocytes: 0 %
Lymphocytes Relative: 38 %
Lymphs Abs: 2.2 10*3/uL (ref 0.7–4.0)
MCH: 25.7 pg — ABNORMAL LOW (ref 26.0–34.0)
MCHC: 32.1 g/dL (ref 30.0–36.0)
MCV: 80 fL (ref 80.0–100.0)
Monocytes Absolute: 0.4 10*3/uL (ref 0.1–1.0)
Monocytes Relative: 7 %
Neutro Abs: 2.9 10*3/uL (ref 1.7–7.7)
Neutrophils Relative %: 52 %
Platelets: 314 10*3/uL (ref 150–400)
RBC: 4.4 MIL/uL (ref 3.87–5.11)
RDW: 17.2 % — ABNORMAL HIGH (ref 11.5–15.5)
WBC: 5.8 10*3/uL (ref 4.0–10.5)
nRBC: 0 % (ref 0.0–0.2)

## 2021-11-30 NOTE — ED Notes (Signed)
Pt sleeping at present, no distress noted.  Monitoring for safety. 

## 2021-11-30 NOTE — ED Notes (Signed)
[facility] called for lab pick up.

## 2021-11-30 NOTE — ED Provider Notes (Signed)
Behavioral Health Progress Note  Date and Time: 11/30/2021 10:06 AM Name: Teresa Murillo MRN:  [id number]  HPI:  Teresa Murillo 61 year old female with a past psychiatric history of schizophrenia, aggressive behavior, and possible intellectual disability presented to [facility] on 11/23/21 voluntarily as a walk in via [facility] with complaints that she was locked out of the group home.  Reports that owner of group home called police but doesn't know why.  Group homeowner [name] at [phone] reports patient only living there temporarily while DSS look for another place to live.  Reports patient has already been discharged from facility. Reported unwilling to take back unless patient has a guardian related to patient leaving, staying gone for days and missing doses of medications.  Reports that patient is unable to care for herself, needs reminders to shower and complete ADL, also needs assistance with medication management.     Patient unable to return to group home unless she has been assigned a guardian.  Patient unable to care for herself and has been boarding on the continuous assessment unit while awaiting DSS caseworker to find appropriate placement.  Social work/TOC working with DSS to find placement.    Subjective:  "I feel good"  [name], seen face to face by this provider for reassessment, consulted with Dr. Hampton Abbot; and chart reviewed on 11/30/21.  On evaluation Meara Wiechman reports that she feels good today.  Amamda continues to deny suicidal/self-harm/homicidal ideation, psychosis, and paranoia.  States that she continues to eat/sleep without difficulty.  States she is taking medications as ordered and has had no adverse reaction.   During evaluation Azharia Surratt is sitting up in bed with no noted distress.  She is alert, oriented x 4, calm, cooperative and attentive.  Her mood is euthymic with congruent affect.  She has normal speech, and behavior.  Objectively  there is no evidence of psychosis/mania or delusional thinking.  Patient is able to converse coherently, goal directed thoughts, no distractibility, or pre-occupation.  She also denies suicidal/self-harm/homicidal ideation, psychosis, and paranoia.    Diagnosis:  Final diagnoses:  At risk for self care deficit  Noncompliance  Self-care deficit for medication administration  Schizophrenia, unspecified type ([place])    Total Time spent with patient: 15 minutes  Past Psychiatric History: schizophrenia, aggressive behavior, and possible intellectual disability presented  Past Medical History:  Past Medical History:  Diagnosis Date  . Chronic obstructive pulmonary disease (COPD) ([place])   . Glaucoma   . Hyperlipidemia   . Hypertension   . Iron deficiency   . Schizoaffective disorder ([place])   . Type 2 diabetes mellitus ([place])     Past Surgical History:  Procedure Laterality Date  . TUBAL LIGATION     Family History:  Family History  Problem Relation Age of Onset  . Breast cancer Maternal Grandmother    Family Psychiatric  History: Unaware Social History:  Social History   Substance and Sexual Activity  Alcohol Use Yes     Social History   Substance and Sexual Activity  Drug Use Not Currently    Social History   Socioeconomic History  . Marital status: Divorced    Spouse name: Not on file  . Number of children: Not on file  . Years of education: Not on file  . Highest education level: Not on file  Occupational History  . Not on file  Tobacco Use  . Smoking status: Every Day    Packs/day: 1.00    Types: Cigars,  Cigarettes  . Smokeless tobacco: Current  Vaping Use  . Vaping Use: Never used  Substance and Sexual Activity  . Alcohol use: Yes  . Drug use: Not Currently  . Sexual activity: Not Currently    Birth control/protection: Surgical  Other Topics Concern  . Not on file  Social History Narrative  . Not on file   Social Determinants of Health   Financial  Resource Strain: Not on file  Food Insecurity: Not on file  Transportation Needs: Not on file  Physical Activity: Not on file  Stress: Not on file  Social Connections: Not on file   SDOH:  SDOH Screenings   Depression (PHQ2-9): Low Risk  (11/23/2021)  Tobacco Use: High Risk (11/30/2021)   Additional Social History:    Pain Medications: See MAR Prescriptions: See MAR Over the Counter: See MAR History of alcohol / drug use?: No history of alcohol / drug abuse Longest period of sobriety (when/how long): N/A     Sleep: Good  Appetite:  Good  Current Medications:  Current Facility-Administered Medications  Medication Dose Route Frequency Provider Last Rate Last Admin  . acetaminophen (TYLENOL) tablet 650 mg  650 mg Oral Q6H PRN Izzabell Klasen B, NP   650 mg at 11/28/21 2015  . albuterol (VENTOLIN HFA) 108 (90 Base) MCG/ACT inhaler 2 puff  2 puff Inhalation Q6H PRN Sharetta Ricchio B, NP      . alum & mag hydroxide-simeth (MAALOX/MYLANTA) 200-200-20 MG/5ML suspension 30 mL  30 mL Oral Q4H PRN Aleric Froelich B, NP      . apixaban (ELIQUIS) tablet 5 mg  5 mg Oral BID Trixy Loyola B, NP   5 mg at 11/29/21 2118  . cloZAPine (CLOZARIL) tablet 50 mg  50 mg Oral Daily [name], NP   50 mg at 11/29/21 0955  . cloZAPine (CLOZARIL) tablet 75 mg  75 mg Oral QHS [name], NP   75 mg at 11/29/21 2115  . diltiazem (CARDIZEM CD) 24 hr capsule 240 mg  240 mg Oral Daily Darlyne Schmiesing B, NP   240 mg at 11/29/21 0954  . divalproex (DEPAKOTE ER) 24 hr tablet 500 mg  500 mg Oral BID Jaimey Franchini B, NP   500 mg at 11/29/21 2117  . fluticasone furoate-vilanterol (BREO ELLIPTA) 200-25 MCG/ACT 1 puff  1 puff Inhalation Daily Lauralee Waters B, NP   1 puff at 11/30/21 0757  . haloperidol (HALDOL) tablet 10 mg  10 mg Oral Q8H PRN Keiland Pickering B, NP      . insulin aspart (novoLOG) injection 0-15 Units  0-15 Units Subcutaneous TID WC Shailynn Fong B, NP   3 Units at 11/29/21 1621  . insulin  glargine-yfgn (SEMGLEE) injection 12 Units  12 Units Subcutaneous Daily Davis Vannatter B, NP   12 Units at 11/29/21 0957  . latanoprost (XALATAN) 0.005 % ophthalmic solution 1 drop  1 drop Both Eyes QHS Jrake Rodriquez B, NP   1 drop at 11/29/21 2119  . magnesium hydroxide (MILK OF MAGNESIA) suspension 30 mL  30 mL Oral Daily PRN Zakhia Seres B, NP      . metFORMIN (GLUCOPHAGE) tablet 500 mg  500 mg Oral BID WC Kambrea Carrasco B, NP   500 mg at 11/30/21 0756  . mupirocin ointment (BACTROBAN) 2 % 1 Application  1 Application Topical TID Reef Achterberg B, NP   1 Application at 78/93/81 2119  . nicotine (NICODERM CQ - dosed in mg/24 hr) patch 7 mg  7 mg  Transdermal Daily Ferrah Panagopoulos B, NP   7 mg at 11/29/21 0956  . [START ON 12/03/2021] Vitamin D (Ergocalciferol) (DRISDOL) 1.25 MG (50000 UNIT) capsule 50,000 Units  50,000 Units Oral Q7 days Khylah Kendra B, NP       Current Outpatient Medications  Medication Sig Dispense Refill  . Accu-Chek Softclix Lancets lancets Use as directed up to four times daily 100 each 0  . apixaban (ELIQUIS) 5 MG TABS tablet Take 1 tablet (5 mg total) by mouth 2 (two) times daily. 60 tablet 0  . ARIPiprazole (ABILIFY) 10 MG tablet Take 1 tablet (10 mg total) by mouth daily. 30 tablet 0  . Blood Glucose Monitoring Suppl (ACCU-CHEK GUIDE) w/Device KIT Use as directed up to four times daily 1 kit 0  . budesonide-formoterol (SYMBICORT) 160-4.5 MCG/ACT inhaler Inhale 2 puffs into the lungs in the morning and at bedtime.    . Cholecalciferol (VITAMIN D3) 1.25 MG (50000 UT) CAPS Take 50,000 Units by mouth every Thursday.    . cloZAPine (CLOZARIL) 25 MG tablet Take 3 tablets (75 mg total) by mouth at bedtime. 90 tablet 0  . clozapine (CLOZARIL) 50 MG tablet Take 1 tablet (50 mg total) by mouth daily. 30 tablet 0  . diltiazem (CARDIZEM CD) 240 MG 24 hr capsule Take 1 capsule (240 mg total) by mouth daily. (Patient not taking: Reported on 11/24/2021) 30 capsule 0  . divalproex  (DEPAKOTE ER) 500 MG 24 hr tablet Take 1 tablet (500 mg total) by mouth 2 (two) times daily. 60 tablet 0  . glucose blood test strip Use as directed up to four times daily 50 each 0  . haloperidol (HALDOL) 10 MG tablet Take 1 tablet (10 mg total) by mouth 3 (three) times daily as needed for agitation (and psychotic symptoms).    . INGREZZA 40 MG capsule Take 1 capsule (40 mg total) by mouth in the morning. 30 capsule 0  . insulin aspart (NOVOLOG) 100 UNIT/ML FlexPen Before each meal 3 times a day, 140-199 - 2 units, 200-250 - 4 units, 251-299 - 6 units,  300-349 - 8 units,  350 or above 10 units. Insulin PEN if approved, provide syringes and needles if needed.Please switch to any approved short acting Insulin if needed. 15 mL 0  . insulin glargine (LANTUS) 100 UNIT/ML Solostar Pen Inject 12 Units into the skin daily. 15 mL 0  . Insulin Pen Needle 32G X 4 MM MISC Use 4 times a day with insulin, 1 month supply. 100 each 0  . latanoprost (XALATAN) 0.005 % ophthalmic solution Place 1 drop into both eyes at bedtime.    . metFORMIN (GLUCOPHAGE) 500 MG tablet Take 500 mg by mouth 2 (two) times daily with a meal.    . nicotine (NICODERM CQ - DOSED IN MG/24 HR) 7 mg/24hr patch Place 1 patch (7 mg total) onto the skin daily. 28 patch 0  . PROAIR HFA 108 (90 Base) MCG/ACT inhaler Inhale 2 puffs into the lungs every 6 (six) hours as needed for wheezing or shortness of breath.      Labs  Lab Results:  Admission on 11/23/2021  Component Date Value Ref Range Status  . SARS Coronavirus 2 by RT PCR 11/23/2021 NEGATIVE  NEGATIVE Final   Comment: (NOTE) SARS-CoV-2 target nucleic acids are NOT DETECTED.  The SARS-CoV-2 RNA is generally detectable in upper respiratory specimens during the acute phase of infection. The lowest concentration of SARS-CoV-2 viral copies this assay can detect is 138 copies/mL.  A negative result does not preclude SARS-Cov-2 infection and should not be used as the sole basis for  treatment or other patient management decisions. A negative result may occur with  improper specimen collection/handling, submission of specimen other than nasopharyngeal swab, presence of viral mutation(s) within the areas targeted by this assay, and inadequate number of viral copies(<138 copies/mL). A negative result must be combined with clinical observations, patient history, and epidemiological information. The expected result is Negative.  Fact Sheet for Patients:  EntrepreneurPulse.com.au  Fact Sheet for Healthcare Providers:  IncredibleEmployment.be  This test is no                          t yet approved or cleared by the [place] FDA and  has been authorized for detection and/or diagnosis of SARS-CoV-2 by FDA under an Emergency Use Authorization (EUA). This EUA will remain  in effect (meaning this test can be used) for the duration of the COVID-19 declaration under Section 564(b)(1) of the Act, 21 U.S.C.section 360bbb-3(b)(1), unless the authorization is terminated  or revoked sooner.      . Influenza A by PCR 11/23/2021 NEGATIVE  NEGATIVE Final  . Influenza B by PCR 11/23/2021 NEGATIVE  NEGATIVE Final   Comment: (NOTE) The Xpert Xpress SARS-CoV-2/FLU/RSV plus assay is intended as an aid in the diagnosis of influenza from Nasopharyngeal swab specimens and should not be used as a sole basis for treatment. Nasal washings and aspirates are unacceptable for Xpert Xpress SARS-CoV-2/FLU/RSV testing.  Fact Sheet for Patients: EntrepreneurPulse.com.au  Fact Sheet for Healthcare Providers: IncredibleEmployment.be  This test is not yet approved or cleared by the [place] FDA and has been authorized for detection and/or diagnosis of SARS-CoV-2 by FDA under an Emergency Use Authorization (EUA). This EUA will remain in effect (meaning this test can be used) for the duration of the COVID-19  declaration under Section 564(b)(1) of the Act, 21 U.S.C. section 360bbb-3(b)(1), unless the authorization is terminated or revoked.  Performed at [facility], [place] [address], [place], [place] [address]   . WBC 11/23/2021 7.4  4.0 - 10.5 K/uL Final  . RBC 11/23/2021 4.25  3.87 - 5.11 MIL/uL Final  . Hemoglobin 11/23/2021 11.1 (L)  12.0 - 15.0 g/dL Final  . HCT 11/23/2021 34.6 (L)  36.0 - 46.0 % Final  . MCV 11/23/2021 81.4  80.0 - 100.0 fL Final  . MCH 11/23/2021 26.1  26.0 - 34.0 pg Final  . MCHC 11/23/2021 32.1  30.0 - 36.0 g/dL Final  . RDW 11/23/2021 17.7 (H)  11.5 - 15.5 % Final  . Platelets 11/23/2021 365  150 - 400 K/uL Final  . nRBC 11/23/2021 0.0  0.0 - 0.2 % Final  . Neutrophils Relative % 11/23/2021 59  % Final  . Neutro Abs 11/23/2021 4.3  1.7 - 7.7 K/uL Final  . Lymphocytes Relative 11/23/2021 31  % Final  . Lymphs Abs 11/23/2021 2.3  0.7 - 4.0 K/uL Final  . Monocytes Relative 11/23/2021 7  % Final  . Monocytes Absolute 11/23/2021 0.5  0.1 - 1.0 K/uL Final  . Eosinophils Relative 11/23/2021 2  % Final  . Eosinophils Absolute 11/23/2021 0.1  0.0 - 0.5 K/uL Final  . Basophils Relative 11/23/2021 1  % Final  . Basophils Absolute 11/23/2021 0.1  0.0 - 0.1 K/uL Final  . Immature Granulocytes 11/23/2021 0  % Final  . Abs Immature Granulocytes 11/23/2021 0.01  0.00 - 0.07 K/uL  Final   Performed at [facility], [place] [address], [place], [place] [address]  . Sodium 11/23/2021 139  135 - 145 mmol/L Final  . Potassium 11/23/2021 4.1  3.5 - 5.1 mmol/L Final  . Chloride 11/23/2021 105  98 - 111 mmol/L Final  . CO2 11/23/2021 26  22 - 32 mmol/L Final  . Glucose, Bld 11/23/2021 101 (H)  70 - 99 mg/dL Final   Glucose reference range applies only to samples taken after fasting for at least 8 hours.  . BUN 11/23/2021 5 (L)  6 - 20 mg/dL Final  . Creatinine, Ser 11/23/2021 0.51  0.44 - 1.00 mg/dL Final  . Calcium 11/23/2021 9.0  8.9 - 10.3 mg/dL Final  . Total Protein  11/23/2021 6.7  6.5 - 8.1 g/dL Final  . Albumin 11/23/2021 3.6  3.5 - 5.0 g/dL Final  . AST 11/23/2021 24  15 - 41 U/L Final  . ALT 11/23/2021 18  0 - 44 U/L Final  . Alkaline Phosphatase 11/23/2021 62  38 - 126 U/L Final  . Total Bilirubin 11/23/2021 0.4  0.3 - 1.2 mg/dL Final  . GFR, Estimated 11/23/2021 >60  >60 mL/min Final   Comment: (NOTE) Calculated using the CKD-EPI Creatinine Equation (2021)   . Anion gap 11/23/2021 8  5 - 15 Final   Performed at [facility] [address] [place], [place] [address]  . Magnesium 11/23/2021 2.0  1.7 - 2.4 mg/dL Final   Performed at [facility] [address], [place], [place] [address]  . Alcohol, Ethyl (B) 11/23/2021 <10  <10 mg/dL Final   Comment: (NOTE) Lowest detectable limit for serum alcohol is 10 mg/dL.  For medical purposes only. Performed at [facility], [place] [address], [place], [place] [address]   . Prolactin 11/23/2021 3.8 (L)  4.8 - 23.3 ng/mL Final   Comment: (NOTE) Performed At: [facility] [place], [place] [id number] Rush Farmer MD CZ:[id number]   . Color, Urine 11/23/2021 YELLOW  YELLOW Final  . APPearance 11/23/2021 CLEAR  CLEAR Final  . Specific Gravity, Urine 11/23/2021 1.025  1.005 - 1.030 Final  . pH 11/23/2021 6.5  5.0 - 8.0 Final  . Glucose, UA 11/23/2021 NEGATIVE  NEGATIVE mg/dL Final  . Hgb urine dipstick 11/23/2021 NEGATIVE  NEGATIVE Final  . Bilirubin Urine 11/23/2021 NEGATIVE  NEGATIVE Final  . Ketones, ur 11/23/2021 NEGATIVE  NEGATIVE mg/dL Final  . Protein, ur 11/23/2021 NEGATIVE  NEGATIVE mg/dL Final  . Nitrite 11/23/2021 NEGATIVE  NEGATIVE Final  . Chalmers Guest 11/23/2021 NEGATIVE  NEGATIVE Final   Microscopic not done on urines with negative protein, blood, leukocytes, nitrite, or glucose < 500 mg/dL.  . RBC / HPF 11/23/2021 0-5  0 - 5 RBC/hpf Final  . WBC, UA 11/23/2021 0-5  0 - 5 WBC/hpf Final  . Bacteria, UA 11/23/2021 NONE SEEN  NONE SEEN Final   . Squamous Epithelial / LPF 11/23/2021 0-5  0 - 5 Final  . Mucus 11/23/2021 PRESENT   Final   Performed at [facility], [place] [address] [place], [place] [address]  . POC Amphetamine UR 11/23/2021 None Detected  NONE DETECTED (Cut Off Level 1000 ng/mL) Final  . POC Secobarbital (BAR) 11/23/2021 None Detected  NONE DETECTED (Cut Off Level 300 ng/mL) Final  . POC Buprenorphine (BUP) 11/23/2021 None Detected  NONE DETECTED (Cut Off Level 10 ng/mL) Final  . POC Oxazepam (BZO) 11/23/2021 None Detected  NONE  DETECTED (Cut Off Level 300 ng/mL) Final  . POC Cocaine UR 11/23/2021 None Detected  NONE DETECTED (Cut Off Level 300 ng/mL) Final  . POC Methamphetamine UR 11/23/2021 None Detected  NONE DETECTED (Cut Off Level 1000 ng/mL) Final  . POC Morphine 11/23/2021 None Detected  NONE DETECTED (Cut Off Level 300 ng/mL) Final  . POC Methadone UR 11/23/2021 None Detected  NONE DETECTED (Cut Off Level 300 ng/mL) Final  . POC Oxycodone UR 11/23/2021 None Detected  NONE DETECTED (Cut Off Level 100 ng/mL) Final  . POC Marijuana UR 11/23/2021 None Detected  NONE DETECTED (Cut Off Level 50 ng/mL) Final  . SARSCOV2ONAVIRUS 2 AG 11/23/2021 NEGATIVE  NEGATIVE Final   Comment: (NOTE) SARS-CoV-2 antigen NOT DETECTED.   Negative results are presumptive.  Negative results do not preclude SARS-CoV-2 infection and should not be used as the sole basis for treatment or other patient management decisions, including infection  control decisions, particularly in the presence of clinical signs and  symptoms consistent with COVID-19, or in those who have been in contact with the virus.  Negative results must be combined with clinical observations, patient history, and epidemiological information. The expected result is Negative.  Fact Sheet for Patients: HandmadeRecipes.com.cy  Fact Sheet for Healthcare Providers: FuneralLife.at  This test is not yet approved or  cleared by the [place] FDA and  has been authorized for detection and/or diagnosis of SARS-CoV-2 by FDA under an Emergency Use Authorization (EUA).  This EUA will remain in effect (meaning this test can be used) for the duration of  the COV                          ID-19 declaration under Section 564(b)(1) of the Act, 21 U.S.C. section 360bbb-3(b)(1), unless the authorization is terminated or revoked sooner.    . Cholesterol 11/23/2021 171  0 - 200 mg/dL Final  . Triglycerides 11/23/2021 125  <150 mg/dL Final  . HDL 11/23/2021 65  >40 mg/dL Final  . Total CHOL/HDL Ratio 11/23/2021 2.6  RATIO Final  . VLDL 11/23/2021 25  0 - 40 mg/dL Final  . LDL Cholesterol 11/23/2021 81  0 - 99 mg/dL Final   Comment:        Total Cholesterol/HDL:CHD Risk Coronary Heart Disease Risk Table                     Men   Women  1/2 Average Risk   3.4   3.3  Average Risk       5.0   4.4  2 X Average Risk   9.6   7.1  3 X Average Risk  23.4   11.0        Use the calculated Patient Ratio above and the CHD Risk Table to determine the patient's CHD Risk.        ATP III CLASSIFICATION (LDL):  <100     mg/dL   Optimal  100-129  mg/dL   Near or Above                    Optimal  130-159  mg/dL   Borderline  160-189  mg/dL   High  >190     mg/dL   Very High Performed at [facility] [address] [place], [place] [address]   . TSH 11/23/2021 1.788  0.350 - 4.500 uIU/mL Final   Comment: Performed by a 3rd Generation assay with  a functional sensitivity of <=0.01 uIU/mL. Performed at [facility], [place] [address], [place],  [address]   . Glucose-Capillary 11/23/2021 137 (H)  70 - 99 mg/dL Final   Glucose reference range applies only to samples taken after fasting for at least 8 hours.  . Glucose-Capillary 11/24/2021 144 (H)  70 - 99 mg/dL Final   Glucose reference range applies only to samples taken after fasting for at least 8 hours.  . Glucose-Capillary 11/24/2021 87  70 - 99  mg/dL Final   Glucose reference range applies only to samples taken after fasting for at least 8 hours.  . Glucose-Capillary 11/24/2021 269 (H)  70 - 99 mg/dL Final   Glucose reference range applies only to samples taken after fasting for at least 8 hours.  . Glucose-Capillary 11/25/2021 112 (H)  70 - 99 mg/dL Final   Glucose reference range applies only to samples taken after fasting for at least 8 hours.  . Glucose-Capillary 11/25/2021 136 (H)  70 - 99 mg/dL Final   Glucose reference range applies only to samples taken after fasting for at least 8 hours.  . Glucose-Capillary 11/25/2021 187 (H)  70 - 99 mg/dL Final   Glucose reference range applies only to samples taken after fasting for at least 8 hours.  . Glucose-Capillary 11/25/2021 159 (H)  70 - 99 mg/dL Final   Glucose reference range applies only to samples taken after fasting for at least 8 hours.  . Glucose-Capillary 11/26/2021 122 (H)  70 - 99 mg/dL Final   Glucose reference range applies only to samples taken after fasting for at least 8 hours.  . Glucose-Capillary 11/26/2021 140 (H)  70 - 99 mg/dL Final   Glucose reference range applies only to samples taken after fasting for at least 8 hours.  . Glucose-Capillary 11/26/2021 102 (H)  70 - 99 mg/dL Final   Glucose reference range applies only to samples taken after fasting for at least 8 hours.  . Glucose-Capillary 11/26/2021 166 (H)  70 - 99 mg/dL Final   Glucose reference range applies only to samples taken after fasting for at least 8 hours.  . Glucose-Capillary 11/27/2021 133 (H)  70 - 99 mg/dL Final   Glucose reference range applies only to samples taken after fasting for at least 8 hours.  . Glucose-Capillary 11/27/2021 146 (H)  70 - 99 mg/dL Final   Glucose reference range applies only to samples taken after fasting for at least 8 hours.  . Glucose-Capillary 11/27/2021 144 (H)  70 - 99 mg/dL Final   Glucose reference range applies only to samples taken after fasting for at  least 8 hours.  . Glucose-Capillary 11/27/2021 154 (H)  70 - 99 mg/dL Final   Glucose reference range applies only to samples taken after fasting for at least 8 hours.  . Glucose-Capillary 11/28/2021 107 (H)  70 - 99 mg/dL Final   Glucose reference range applies only to samples taken after fasting for at least 8 hours.  . Glucose-Capillary 11/28/2021 166 (H)  70 - 99 mg/dL Final   Glucose reference range applies only to samples taken after fasting for at least 8 hours.  . Glucose-Capillary 11/28/2021 89  70 - 99 mg/dL Final   Glucose reference range applies only to samples taken after fasting for at least 8 hours.  . Comment 1 11/28/2021 NURRP   Final  . Glucose-Capillary 11/28/2021 243 (H)  70 - 99 mg/dL Final   Glucose reference range applies only to  samples taken after fasting for at least 8 hours.  . Glucose-Capillary 11/29/2021 193 (H)  70 - 99 mg/dL Final   Glucose reference range applies only to samples taken after fasting for at least 8 hours.  . Glucose-Capillary 11/29/2021 164 (H)  70 - 99 mg/dL Final   Glucose reference range applies only to samples taken after fasting for at least 8 hours.  . Glucose-Capillary 11/29/2021 192 (H)  70 - 99 mg/dL Final   Glucose reference range applies only to samples taken after fasting for at least 8 hours.  . Glucose-Capillary 11/29/2021 150 (H)  70 - 99 mg/dL Final   Glucose reference range applies only to samples taken after fasting for at least 8 hours.  . Glucose-Capillary 11/30/2021 120 (H)  70 - 99 mg/dL Final   Glucose reference range applies only to samples taken after fasting for at least 8 hours.  Admission on 11/22/2021, Discharged on 11/22/2021  Component Date Value Ref Range Status  . Glucose-Capillary 11/22/2021 169 (H)  70 - 99 mg/dL Final   Glucose reference range applies only to samples taken after fasting for at least 8 hours.  No results displayed because visit has over 200 results.    Admission on 10/04/2021, Discharged on  10/22/2021  Component Date Value Ref Range Status  . SARS Coronavirus 2 by RT PCR 10/04/2021 NEGATIVE  NEGATIVE Final   Comment: (NOTE) SARS-CoV-2 target nucleic acids are NOT DETECTED.  The SARS-CoV-2 RNA is generally detectable in upper respiratory specimens during the acute phase of infection. The lowest concentration of SARS-CoV-2 viral copies this assay can detect is 138 copies/mL. A negative result does not preclude SARS-Cov-2 infection and should not be used as the sole basis for treatment or other patient management decisions. A negative result may occur with  improper specimen collection/handling, submission of specimen other than nasopharyngeal swab, presence of viral mutation(s) within the areas targeted by this assay, and inadequate number of viral copies(<138 copies/mL). A negative result must be combined with clinical observations, patient history, and epidemiological information. The expected result is Negative.  Fact Sheet for Patients:  EntrepreneurPulse.com.au  Fact Sheet for Healthcare Providers:  IncredibleEmployment.be  This test is no                          t yet approved or cleared by the [place] FDA and  has been authorized for detection and/or diagnosis of SARS-CoV-2 by FDA under an Emergency Use Authorization (EUA). This EUA will remain  in effect (meaning this test can be used) for the duration of the COVID-19 declaration under Section 564(b)(1) of the Act, 21 U.S.C.section 360bbb-3(b)(1), unless the authorization is terminated  or revoked sooner.      . Influenza A by PCR 10/04/2021 NEGATIVE  NEGATIVE Final  . Influenza B by PCR 10/04/2021 NEGATIVE  NEGATIVE Final   Comment: (NOTE) The Xpert Xpress SARS-CoV-2/FLU/RSV plus assay is intended as an aid in the diagnosis of influenza from Nasopharyngeal swab specimens and should not be used as a sole basis for treatment. Nasal washings and aspirates are  unacceptable for Xpert Xpress SARS-CoV-2/FLU/RSV testing.  Fact Sheet for Patients: EntrepreneurPulse.com.au  Fact Sheet for Healthcare Providers: IncredibleEmployment.be  This test is not yet approved or cleared by the [place] FDA and has been authorized for detection and/or diagnosis of SARS-CoV-2 by FDA under an Emergency Use Authorization (EUA). This EUA will remain in effect (meaning this test can be used) for  the duration of the COVID-19 declaration under Section 564(b)(1) of the Act, 21 U.S.C. section 360bbb-3(b)(1), unless the authorization is terminated or revoked.  Performed at [facility], [place] [address] [place], [place] [address]   . WBC 10/04/2021 8.4  4.0 - 10.5 K/uL Final  . RBC 10/04/2021 4.42  3.87 - 5.11 MIL/uL Final  . Hemoglobin 10/04/2021 11.6 (L)  12.0 - 15.0 g/dL Final  . HCT 10/04/2021 35.7 (L)  36.0 - 46.0 % Final  . MCV 10/04/2021 80.8  80.0 - 100.0 fL Final  . MCH 10/04/2021 26.2  26.0 - 34.0 pg Final  . MCHC 10/04/2021 32.5  30.0 - 36.0 g/dL Final  . RDW 10/04/2021 16.0 (H)  11.5 - 15.5 % Final  . Platelets 10/04/2021 372  150 - 400 K/uL Final  . nRBC 10/04/2021 0.0  0.0 - 0.2 % Final  . Neutrophils Relative % 10/04/2021 68  % Final  . Neutro Abs 10/04/2021 5.7  1.7 - 7.7 K/uL Final  . Lymphocytes Relative 10/04/2021 22  % Final  . Lymphs Abs 10/04/2021 1.8  0.7 - 4.0 K/uL Final  . Monocytes Relative 10/04/2021 8  % Final  . Monocytes Absolute 10/04/2021 0.7  0.1 - 1.0 K/uL Final  . Eosinophils Relative 10/04/2021 1  % Final  . Eosinophils Absolute 10/04/2021 0.1  0.0 - 0.5 K/uL Final  . Basophils Relative 10/04/2021 1  % Final  . Basophils Absolute 10/04/2021 0.1  0.0 - 0.1 K/uL Final  . Immature Granulocytes 10/04/2021 0  % Final  . Abs Immature Granulocytes 10/04/2021 0.03  0.00 - 0.07 K/uL Final   Performed at [facility], [place] [address] [place], [place] [address]  . Sodium 10/04/2021  136  135 - 145 mmol/L Final  . Potassium 10/04/2021 4.2  3.5 - 5.1 mmol/L Final  . Chloride 10/04/2021 104  98 - 111 mmol/L Final  . CO2 10/04/2021 25  22 - 32 mmol/L Final  . Glucose, Bld 10/04/2021 100 (H)  70 - 99 mg/dL Final   Glucose reference range applies only to samples taken after fasting for at least 8 hours.  . BUN 10/04/2021 9  6 - 20 mg/dL Final  . Creatinine, Ser 10/04/2021 0.52  0.44 - 1.00 mg/dL Final  . Calcium 10/04/2021 9.0  8.9 - 10.3 mg/dL Final  . Total Protein 10/04/2021 7.0  6.5 - 8.1 g/dL Final  . Albumin 10/04/2021 3.2 (L)  3.5 - 5.0 g/dL Final  . AST 10/04/2021 13 (L)  15 - 41 U/L Final  . ALT 10/04/2021 10  0 - 44 U/L Final  . Alkaline Phosphatase 10/04/2021 61  38 - 126 U/L Final  . Total Bilirubin 10/04/2021 0.3  0.3 - 1.2 mg/dL Final  . GFR, Estimated 10/04/2021 >60  >60 mL/min Final   Comment: (NOTE) Calculated using the CKD-EPI Creatinine Equation (2021)   . Anion gap 10/04/2021 7  5 - 15 Final   Performed at [facility] [address], [place], [place] [address]  . Hgb A1c MFr Bld 10/04/2021 7.0 (H)  4.8 - 5.6 % Final   Comment: (NOTE) Pre diabetes:          5.7%-6.4%  Diabetes:              >6.4%  Glycemic control for   <7.0% adults with diabetes   . Mean Plasma Glucose 10/04/2021 154.2  mg/dL Final   Performed at [facility]  [address] [place], [place] [address]  . Cholesterol 10/04/2021 178  0 - 200 mg/dL Final  . Triglycerides 10/04/2021 155 (H)  <150 mg/dL Final  . HDL 10/04/2021 52  >40 mg/dL Final  . Total CHOL/HDL Ratio 10/04/2021 3.4  RATIO Final  . VLDL 10/04/2021 31  0 - 40 mg/dL Final  . LDL Cholesterol 10/04/2021 95  0 - 99 mg/dL Final   Comment:        Total Cholesterol/HDL:CHD Risk Coronary Heart Disease Risk Table                     Men   Women  1/2 Average Risk   3.4   3.3  Average Risk       5.0   4.4  2 X Average Risk   9.6   7.1  3 X Average Risk  23.4   11.0        Use the calculated Patient  Ratio above and the CHD Risk Table to determine the patient's CHD Risk.        ATP III CLASSIFICATION (LDL):  <100     mg/dL   Optimal  100-129  mg/dL   Near or Above                    Optimal  130-159  mg/dL   Borderline  160-189  mg/dL   High  >190     mg/dL   Very High Performed at [facility] [address] [place], [place] [address]   . POC Amphetamine UR 10/04/2021 None Detected  NONE DETECTED (Cut Off Level 1000 ng/mL) Final  . POC Secobarbital (BAR) 10/04/2021 None Detected  NONE DETECTED (Cut Off Level 300 ng/mL) Final  . POC Buprenorphine (BUP) 10/04/2021 None Detected  NONE DETECTED (Cut Off Level 10 ng/mL) Final  . POC Oxazepam (BZO) 10/04/2021 None Detected  NONE DETECTED (Cut Off Level 300 ng/mL) Final  . POC Cocaine UR 10/04/2021 None Detected  NONE DETECTED (Cut Off Level 300 ng/mL) Final  . POC Methamphetamine UR 10/04/2021 None Detected  NONE DETECTED (Cut Off Level 1000 ng/mL) Final  . POC Morphine 10/04/2021 None Detected  NONE DETECTED (Cut Off Level 300 ng/mL) Final  . POC Methadone UR 10/04/2021 None Detected  NONE DETECTED (Cut Off Level 300 ng/mL) Final  . POC Oxycodone UR 10/04/2021 Positive (A)  NONE DETECTED (Cut Off Level 100 ng/mL) Final  . POC Marijuana UR 10/04/2021 None Detected  NONE DETECTED (Cut Off Level 50 ng/mL) Final  . SARSCOV2ONAVIRUS 2 AG 10/04/2021 NEGATIVE  NEGATIVE Final   Comment: (NOTE) SARS-CoV-2 antigen NOT DETECTED.   Negative results are presumptive.  Negative results do not preclude SARS-CoV-2 infection and should not be used as the sole basis for treatment or other patient management decisions, including infection  control decisions, particularly in the presence of clinical signs and  symptoms consistent with COVID-19, or in those who have been in contact with the virus.  Negative results must be combined with clinical observations, patient history, and epidemiological information. The expected result is  Negative.  Fact Sheet for Patients: HandmadeRecipes.com.cy  Fact Sheet for Healthcare Providers: FuneralLife.at  This test is not yet approved or cleared by the [place] FDA and  has been authorized for detection and/or diagnosis of SARS-CoV-2 by FDA under an Emergency Use Authorization (EUA).  This EUA will remain in effect (meaning this test can be used) for the duration of  the COV  ID-19 declaration under Section 564(b)(1) of the Act, 21 U.S.C. section 360bbb-3(b)(1), unless the authorization is terminated or revoked sooner.    . Valproic Acid Lvl 10/04/2021 51  50.0 - 100.0 ug/mL Final   Performed at [facility] [address] [place], [place] [address]  . Valproic Acid Lvl 10/08/2021 57  50.0 - 100.0 ug/mL Final   Performed at [facility] [address], [place], [place] [address]  . Glucose-Capillary 10/09/2021 123 (H)  70 - 99 mg/dL Final   Glucose reference range applies only to samples taken after fasting for at least 8 hours.  Admission on 09/09/2021, Discharged on 09/10/2021  Component Date Value Ref Range Status  . Sodium 09/09/2021 134 (L)  135 - 145 mmol/L Final  . Potassium 09/09/2021 4.3  3.5 - 5.1 mmol/L Final  . Chloride 09/09/2021 99  98 - 111 mmol/L Final  . CO2 09/09/2021 25  22 - 32 mmol/L Final  . Glucose, Bld 09/09/2021 107 (H)  70 - 99 mg/dL Final   Glucose reference range applies only to samples taken after fasting for at least 8 hours.  . BUN 09/09/2021 12  6 - 20 mg/dL Final  . Creatinine, Ser 09/09/2021 0.74  0.44 - 1.00 mg/dL Final  . Calcium 09/09/2021 9.0  8.9 - 10.3 mg/dL Final  . Total Protein 09/09/2021 7.4  6.5 - 8.1 g/dL Final  . Albumin 09/09/2021 3.1 (L)  3.5 - 5.0 g/dL Final  . AST 09/09/2021 13 (L)  15 - 41 U/L Final  . ALT 09/09/2021 13  0 - 44 U/L Final  . Alkaline Phosphatase 09/09/2021 66  38 - 126 U/L Final  . Total Bilirubin 09/09/2021 0.2  (L)  0.3 - 1.2 mg/dL Final  . GFR, Estimated 09/09/2021 >60  >60 mL/min Final   Comment: (NOTE) Calculated using the CKD-EPI Creatinine Equation (2021)   . Anion gap 09/09/2021 10  5 - 15 Final   Performed at [facility] [address], [place], [place] [address]  . WBC 09/09/2021 8.5  4.0 - 10.5 K/uL Final  . RBC 09/09/2021 4.53  3.87 - 5.11 MIL/uL Final  . Hemoglobin 09/09/2021 11.8 (L)  12.0 - 15.0 g/dL Final  . HCT 09/09/2021 38.0  36.0 - 46.0 % Final  . MCV 09/09/2021 83.9  80.0 - 100.0 fL Final  . MCH 09/09/2021 26.0  26.0 - 34.0 pg Final  . MCHC 09/09/2021 31.1  30.0 - 36.0 g/dL Final  . RDW 09/09/2021 17.8 (H)  11.5 - 15.5 % Final  . Platelets 09/09/2021 442 (H)  150 - 400 K/uL Final  . nRBC 09/09/2021 0.0  0.0 - 0.2 % Final  . Neutrophils Relative % 09/09/2021 70  % Final  . Neutro Abs 09/09/2021 5.9  1.7 - 7.7 K/uL Final  . Lymphocytes Relative 09/09/2021 20  % Final  . Lymphs Abs 09/09/2021 1.7  0.7 - 4.0 K/uL Final  . Monocytes Relative 09/09/2021 8  % Final  . Monocytes Absolute 09/09/2021 0.7  0.1 - 1.0 K/uL Final  . Eosinophils Relative 09/09/2021 1  % Final  . Eosinophils Absolute 09/09/2021 0.1  0.0 - 0.5 K/uL Final  . Basophils Relative 09/09/2021 1  % Final  . Basophils Absolute 09/09/2021 0.1  0.0 - 0.1 K/uL Final  . Immature Granulocytes 09/09/2021 0  % Final  . Abs Immature Granulocytes 09/09/2021 0.03  0.00 - 0.07 K/uL Final   Performed at [facility],  [address], [place], [place] [address]  . Ammonia 09/09/2021 37 (H)  9 - 35 umol/L Final   Comment: HEMOLYSIS AT THIS LEVEL MAY AFFECT RESULT Performed at [facility], [place] [address], [place], [place] [address]   . Opiates 09/09/2021 NONE DETECTED  NONE DETECTED Final  . Cocaine 09/09/2021 NONE DETECTED  NONE DETECTED Final  . Benzodiazepines 09/09/2021 NONE DETECTED  NONE DETECTED Final  . Amphetamines 09/09/2021 NONE DETECTED  NONE DETECTED Final  . Tetrahydrocannabinol 09/09/2021  NONE DETECTED  NONE DETECTED Final  . Barbiturates 09/09/2021 NONE DETECTED  NONE DETECTED Final   Comment: (NOTE) DRUG SCREEN FOR MEDICAL PURPOSES ONLY.  IF CONFIRMATION IS NEEDED FOR ANY PURPOSE, NOTIFY LAB WITHIN 5 DAYS.  LOWEST DETECTABLE LIMITS FOR URINE DRUG SCREEN Drug Class                     Cutoff (ng/mL) Amphetamine and metabolites    1000 Barbiturate and metabolites    200 Benzodiazepine                 546 Tricyclics and metabolites     300 Opiates and metabolites        300 Cocaine and metabolites        300 THC                            50 Performed at [facility], [place] [address] [place], [place] [address]   . Alcohol, Ethyl (B) 09/09/2021 <10  <10 mg/dL Final   Comment: (NOTE) Lowest detectable limit for serum alcohol is 10 mg/dL.  For medical purposes only. Performed at [facility], [place] [address], [place], [place] [address]   . Lipase 09/09/2021 33  11 - 51 U/L Final   Performed at [facility] [address], [place], [place] [address]  . Color, Urine 09/09/2021 COLORLESS (A)  YELLOW Final  . APPearance 09/09/2021 CLEAR  CLEAR Final  . Specific Gravity, Urine 09/09/2021 1.002 (L)  1.005 - 1.030 Final  . pH 09/09/2021 6.0  5.0 - 8.0 Final  . Glucose, UA 09/09/2021 NEGATIVE  NEGATIVE mg/dL Final  . Hgb urine dipstick 09/09/2021 NEGATIVE  NEGATIVE Final  . Bilirubin Urine 09/09/2021 NEGATIVE  NEGATIVE Final  . Ketones, ur 09/09/2021 NEGATIVE  NEGATIVE mg/dL Final  . Protein, ur 09/09/2021 NEGATIVE  NEGATIVE mg/dL Final  . Nitrite 09/09/2021 NEGATIVE  NEGATIVE Final  . Chalmers Guest 09/09/2021 NEGATIVE  NEGATIVE Final   Performed at [facility], [place] [address], [place], [place] [address]  . Specimen Description 09/09/2021 URINE, CLEAN CATCH   Final  . Special Requests 09/09/2021 NONE   Final  . Culture 09/09/2021  (A)   Final                   Value:<10,000 COLONIES/mL INSIGNIFICANT GROWTH Performed at [facility], [place] [address], [place], [place] [address]   . Report Status 09/09/2021 09/10/2021 FINAL   Final  . D-Dimer, Quant 09/09/2021 0.92 (H)  0.00 - 0.50 ug/mL-FEU Final   Comment: (NOTE) At the manufacturer cut-off value of 0.5 g/mL FEU, this assay has a negative predictive value of 95-100%.This assay is intended for use in conjunction with a clinical pretest probability (PTP) assessment model to exclude pulmonary embolism (PE) and deep venous thrombosis (DVT) in outpatients suspected of PE or DVT. Results should be  correlated with clinical presentation. Performed at [facility], [place] [address] [place], [place] [address]   . Troponin I (High Sensitivity) 09/09/2021 5  <18 ng/L Final   Comment: (NOTE) Elevated high sensitivity troponin I (hsTnI) values and significant  changes across serial measurements may suggest ACS but many other  chronic and acute conditions are known to elevate hsTnI results.  Refer to the "Links" section for chest pain algorithms and additional  guidance. Performed at [facility], [place] [address], [place], [place] [address]   . BP 09/10/2021 135/83  mmHg Final  . S' Lateral 09/10/2021 2.30  cm Final  . Area-P 1/2 09/10/2021 5.58  cm2 Final    Blood Alcohol level:  Lab Results  Component Value Date   ETH <10 11/23/2021   ETH <10 65/99/3570    Metabolic Disorder Labs: Lab Results  Component Value Date   HGBA1C 7.0 (H) 10/04/2021   MPG 154.2 10/04/2021   Lab Results  Component Value Date   PROLACTIN 3.8 (L) 11/23/2021   Lab Results  Component Value Date   CHOL 171 11/23/2021   TRIG 125 11/23/2021   HDL 65 11/23/2021   CHOLHDL 2.6 11/23/2021   VLDL 25 11/23/2021   LDLCALC 81 11/23/2021   LDLCALC 95 10/04/2021    Therapeutic Lab Levels: No results found for: "LITHIUM" Lab Results  Component Value Date   VALPROATE 45 (L) 11/09/2021   VALPROATE 57 10/08/2021   No results found for: "CBMZ"  Physical Findings   PHQ2-9     [facility] ED from 11/23/2021 in [facility]  PHQ-2 Total Score 2  PHQ-9 Total Score 3      Flowsheet Row ED from 11/23/2021 in [facility] Most recent reading at 11/23/2021  6:44 PM ED from 11/22/2021 in [facility] DEPT Most recent reading at 11/22/2021  8:28 PM ED from 11/22/2021 in [facility] DEPT Most recent reading at 11/22/2021  1:24 AM  C-SSRS RISK CATEGORY No Risk No Risk No Risk        Musculoskeletal  Strength & Muscle Tone: within normal limits Gait & Station: normal Patient leans: N/A  Psychiatric Specialty Exam  Presentation  General Appearance:  Appropriate for Environment  Eye Contact: Good  Speech: Clear and Coherent; Normal Rate  Speech Volume: Normal  Handedness: Right   Mood and Affect  Mood: Euthymic ("Good")  Affect: Appropriate; Congruent   Thought Process  Thought Processes: Coherent; Goal Directed  Descriptions of Associations:Intact  Orientation:Full (Time, Place and Person)  Thought Content:Logical  Diagnosis of Schizophrenia or Schizoaffective disorder in past: Yes  Duration of Psychotic Symptoms: No data recorded  Hallucinations:Hallucinations: None  Ideas of Reference:None  Suicidal Thoughts:Suicidal Thoughts: No  Homicidal Thoughts:Homicidal Thoughts: No   Sensorium  Memory: Recent Good; Immediate Good  Judgment: Intact  Insight: Lacking; Shallow   Executive Functions  Concentration: Fair  Attention Span: Fair  Recall: [employer] of Knowledge: Fair  Language: Good   Psychomotor Activity  Psychomotor Activity: Psychomotor Activity: Normal   Assets  Assets: Communication Skills; Desire for Improvement; Financial Resources/Insurance; Leisure Time   Sleep  Sleep: Sleep: Good   Nutritional Assessment (For OBS and FBC admissions only) Has the patient had a weight loss  or gain of 10 pounds or more in the last 3 months?: No Has the patient had a decrease in food intake/or appetite?: No Does the patient have dental problems?: No Does  the patient have eating habits or behaviors that may be indicators of an eating disorder including binging or inducing vomiting?: No Has the patient recently lost weight without trying?: 0 Has the patient been eating poorly because of a decreased appetite?: 0 Malnutrition Screening Tool Score: 0    Physical Exam  Physical Exam Cardiovascular:     Rate and Rhythm: Normal rate.  Pulmonary:     Effort: Pulmonary effort is normal.  Neurological:     Mental Status: She is alert and oriented to person, place, and time.  Psychiatric:        Attention and Perception: Attention and perception normal. She does not perceive auditory or visual hallucinations.        Mood and Affect: Mood and affect normal.        Speech: Speech normal.        Behavior: Behavior normal. Behavior is cooperative.        Thought Content: Thought content normal. Thought content is not paranoid or delusional. Thought content does not include homicidal or suicidal ideation.        Judgment: Judgment is impulsive.   Review of Systems  Constitutional:  Negative for chills and fever.  Respiratory:  Negative for shortness of breath.   Cardiovascular:  Negative for chest pain and palpitations.  Gastrointestinal:  Negative for abdominal pain.  Neurological:  Negative for headaches.  Psychiatric/Behavioral: Negative.  Depression: Stable. Hallucinations: Denies. Substance abuse: Denies. Suicidal ideas: Denies. Nervous/anxious: Stable. Insomnia: Denies.    Blood pressure (!) 151/98, pulse 78, temperature 98 F (36.7 C), temperature source Oral, resp. rate 18, SpO2 100 %. There is no height or weight on file to calculate BMI.  Treatment Plan Summary:  Patient has been psychiatrically cleared and continues boarding in the continuous assessment unit at [facility]  while waiting for DSS to find appropriate housing. Daily contact with patient to assess and evaluate symptoms and progress in treatment, Medication management, and Plan Social work/TOC to continue working with DSS to find appropriate housing.    Cosimo Schertzer, NP 11/30/2021 10:06 AM

## 2021-11-30 NOTE — Care Management (Signed)
Care Management   Writer left a HIPPA compliant confidential voice mail message for the Care Coordinator Adria Dill [phone]), the Transition of [facility] worker Beckie Salts [phone]) and Eddie Dibbles Elliot's supervisor - Manon Hilding [phone]).     Writer sent an email to the Care Coordinator Maryln Gottron ([email or web address]), the Transition of [facility] worker Beckie Salts ([email or web address]) and Eddie Dibbles Elliott's supervisor, Manon Hilding ([email or web address]) requesting a daily phone or email check in order to provide the leadership at [facility] an update of the progress that has been made towards placement of the patient.   Writer left a HIPPA compliant confidential voice mail message for the [place] worker, Lizabeth Leyden [phone]) requesting a return phone call regarding with any updates regarding a legal guardian for the patient    Writer spoke to the owner of the facility [facility] [phone].  Per [name] the patient only receives room and board and needs a facility that can provide her with 24-hour monitoring care.    Per chart revie, patient is unable to care for herself, needs reminders to shower and complete ADL, also needs assistance with medication management.

## 2021-11-30 NOTE — ED Notes (Addendum)
   Pt A&Ox 4, sitting on stretcher at present, no distress noted, calm and cooperative, watching TV at present. Pt denies SI/HI/AVH. Will continue to monitor for safety.

## 2021-11-30 NOTE — Care Management (Signed)
Care Management   Writer received a return phone call back from the APS worker, Camille Smith ([phone]).  Per Camille, she met with the patient and started the assessment last week and she is now awaiting the medical records from [facility] in order to complete the assessment.   Camille reports that APS has 30-days to complete the assessment and make a determine if a patient needs a legal guardian.   Per Camille, even if the patient does meet criteria for guardianship, assigning guardianship is a process and the next step would be a court date to determine who would take legal guardianship of the patient.  

## 2021-12-01 LAB — GLUCOSE, CAPILLARY
Glucose-Capillary: 123 mg/dL — ABNORMAL HIGH (ref 70–99)
Glucose-Capillary: 193 mg/dL — ABNORMAL HIGH (ref 70–99)
Glucose-Capillary: 161 mg/dL — ABNORMAL HIGH (ref 70–99)
Glucose-Capillary: 135 mg/dL — ABNORMAL HIGH (ref 70–99)

## 2021-12-01 NOTE — Discharge Instructions (Addendum)
Dear Teresa Murillo,  Most effective treatment for your mental health disease involves BOTH a psychiatrist AND a therapist Psychiatrist to manage medications Therapist to help identify personal goals, barriers from those goals, and plan to achieve those goals by understanding emotions Please make regular appointments with an outpatient psychiatrist and other doctors once you leave the hospital (if any, otherwise, please see below for resources to make an appointment).  For therapy outside the hospital, please ask for these specific types of therapy: DBT ________________________________________________________  SAFETY CRISIS  Dial 988 for [place]    Text [phone] for Crisis Text Line:     [facility]:  [address] FIRST FLOOR.  [place], [place] [address].  [phone]  Mobile Crisis Response Teams Listed by counties in vicinity of [place]. [phone] [place] [phone] [place] [phone] [facility] [phone] [place] [phone] [place]. [phone] [place].  [place] [phone] ________________________________________________________  To see which pharmacy near you is the CHEAPEST for certain medications, please use GoodRx. It is free website and has a free phone app.    Also consider looking at [facility] $4.00 or [facility]'s $7.00 prescription list. Both are free to view if googled "[facility] $4 prescription" and "public's $7 prescription". These are set prices, no insurance required. [facility]'s low cost medications: $4-$15 for 30days  prescriptions or $10-$38 for 90days prescriptions  ________________________________________________________  Difficulties with sleep?   Can also use this free app for insomnia called CBT-I. Let your doctors and therapists know so they can help with extra tips and tricks or for guidance and accountability. NO ADDS on the app.     ________________________________________________________  Non-Emergent / Urgent  [facility] [address] [place], [place] [address] [phone] OUTPATIENT Walk-in information: Please note, all walk-ins are first come & first serve, with limited number of availability.  Please note that to be eligible for services you must bring: ID or a piece of mail with your name [facility] address  Therapist for therapy:  Monday & Wednesdays: Please ARRIVE at 7:15 AM for registration Will START at 8:00 AM Every 1st & 2nd Friday of the month: Please ARRIVE at 10:15 AM for registration Will START at 1 PM - 5 PM  Psychiatrist for medication management: Monday - Friday:  Please ARRIVE at 7:15 AM for registration Will START at 8:00 AM  Regretfully, due to limited availability, please be aware that you may not been seen on the same day as walk-in. Please consider making an appoint or try again. Thank you for your patience and understanding.

## 2021-12-01 NOTE — ED Notes (Signed)
Pt sleeping at present, no distress noted.  Monitoring for safety. 

## 2021-12-01 NOTE — ED Provider Notes (Signed)
Behavioral Health Progress Note  Date and Time: 12/01/2021 6:38 PM Name: Teresa Murillo MRN:  [id number]  Subjective:   HPI:  Teresa Murillo 61 year old female with a past psychiatric history of schizophrenia, aggressive behavior, and possible intellectual disability presented to [facility] on 11/23/21 voluntarily as a walk in via [facility] with complaints that she was locked out of the group home.    [name], 61 y.o., female patient seen face to face by this provider, consulted with Dr. Dwyane Dee; and chart reviewed on 12/01/21.     Per [name] NP note 11/30/2021 Patient on admission reported "that owner of group home called police but doesn't know why.  Group homeowner [name] at [phone] reports patient only living there temporarily while DSS look for another place to live.  Reports patient has already been discharged from facility. Reported unwilling to take back unless patient has a guardian related to patient leaving, staying gone for days and missing doses of medications.  Reports that patient is unable to care for herself, needs reminders to shower and complete ADL, also needs assistance with medication management".      Per social work update: Patient unable to return to group home unless she has been assigned a guardian.  Patient unable to care for herself and has been boarding on the continuous assessment unit while awaiting DSS caseworker to find appropriate placement.  Social work/TOC working with DSS to find placement.  See recent Social work note for update.   On today's evaluation Rakiyah Esch is observed sitting in her bed in no acute distress.  She is fairly groomed and makes good eye contact.  Her speech is clear, coherent, normal rate and tone.  She is alert/oriented x4, calm, cooperative, and fairly attentive.  She denies depression or anxiety.  She is denying SI/HI/AVH.  She is able to converse coherently without any distractibility or internal  preoccupation.  She continues to asked staff for different snacks throughout the day.  She has been calm and cooperative while on the unit.  She has been compliant with medications.  She has been appropriate with staff and other patients.  She has experienced admitted no unsafe behavior while on the unit.  She has not required any as needed medications for agitation  Diagnosis:  Final diagnoses:  At risk for self care deficit  Noncompliance  Self-care deficit for medication administration  Schizophrenia, unspecified type ([place])    Total Time spent with patient: 30 minutes  Past Psychiatric History: See H&P Past Medical History:  Past Medical History:  Diagnosis Date   Chronic obstructive pulmonary disease (COPD) ([place])    Glaucoma    Hyperlipidemia    Hypertension    Iron deficiency    Schizoaffective disorder ([place])    Type 2 diabetes mellitus ([place])     Past Surgical History:  Procedure Laterality Date   TUBAL LIGATION     Family History:  Family History  Problem Relation Age of Onset   Breast cancer Maternal Grandmother    Family Psychiatric  History: See H&P Social History:  Social History   Substance and Sexual Activity  Alcohol Use Yes     Social History   Substance and Sexual Activity  Drug Use Not Currently    Social History   Socioeconomic History   Marital status: Divorced    Spouse name: Not on file   Number of children: Not on file   Years of education: Not on file   Highest education level:  Not on file  Occupational History   Not on file  Tobacco Use   Smoking status: Every Day    Packs/day: 1.00    Types: Cigars, Cigarettes   Smokeless tobacco: Current  Vaping Use   Vaping Use: Never used  Substance and Sexual Activity   Alcohol use: Yes   Drug use: Not Currently   Sexual activity: Not Currently    Birth control/protection: Surgical  Other Topics Concern   Not on file  Social History Narrative   Not on file   Social Determinants of  Health   Financial Resource Strain: Not on file  Food Insecurity: Not on file  Transportation Needs: Not on file  Physical Activity: Not on file  Stress: Not on file  Social Connections: Not on file   SDOH:  SDOH Screenings   Depression (PHQ2-9): Low Risk  (11/23/2021)  Tobacco Use: High Risk (11/30/2021)   Additional Social History:    Pain Medications: See MAR Prescriptions: See MAR Over the Counter: See MAR History of alcohol / drug use?: No history of alcohol / drug abuse Longest period of sobriety (when/how long): N/A                    Sleep: Good  Appetite:  Good  Current Medications:  Current Facility-Administered Medications  Medication Dose Route Frequency Provider Last Rate Last Admin   acetaminophen (TYLENOL) tablet 650 mg  650 mg Oral Q6H PRN Rankin, Shuvon B, NP   650 mg at 11/28/21 2015   albuterol (VENTOLIN HFA) 108 (90 Base) MCG/ACT inhaler 2 puff  2 puff Inhalation Q6H PRN Rankin, Shuvon B, NP       alum & mag hydroxide-simeth (MAALOX/MYLANTA) 200-200-20 MG/5ML suspension 30 mL  30 mL Oral Q4H PRN Rankin, Shuvon B, NP       apixaban (ELIQUIS) tablet 5 mg  5 mg Oral BID Rankin, Shuvon B, NP   5 mg at 12/01/21 0920   cloZAPine (CLOZARIL) tablet 50 mg  50 mg Oral Daily [name], NP   50 mg at 12/01/21 0920   cloZAPine (CLOZARIL) tablet 75 mg  75 mg Oral QHS [name], NP   75 mg at 11/30/21 2127   diltiazem (CARDIZEM CD) 24 hr capsule 240 mg  240 mg Oral Daily Rankin, Shuvon B, NP   240 mg at 12/01/21 0919   divalproex (DEPAKOTE ER) 24 hr tablet 500 mg  500 mg Oral BID Rankin, Shuvon B, NP   500 mg at 12/01/21 0916   fluticasone furoate-vilanterol (BREO ELLIPTA) 200-25 MCG/ACT 1 puff  1 puff Inhalation Daily Rankin, Shuvon B, NP   1 puff at 12/01/21 4332   haloperidol (HALDOL) tablet 10 mg  10 mg Oral Q8H PRN Rankin, Shuvon B, NP       insulin aspart (novoLOG) injection 0-15 Units  0-15 Units Subcutaneous TID WC Rankin, Shuvon B, NP   3 Units at  12/01/21 1719   insulin glargine-yfgn (SEMGLEE) injection 12 Units  12 Units Subcutaneous Daily Rankin, Shuvon B, NP   12 Units at 12/01/21 0920   latanoprost (XALATAN) 0.005 % ophthalmic solution 1 drop  1 drop Both Eyes QHS Rankin, Shuvon B, NP   1 drop at 11/30/21 2139   magnesium hydroxide (MILK OF MAGNESIA) suspension 30 mL  30 mL Oral Daily PRN Rankin, Shuvon B, NP       metFORMIN (GLUCOPHAGE) tablet 500 mg  500 mg Oral BID WC Rankin, Shuvon B, NP   500  mg at 12/01/21 1719   mupirocin ointment (BACTROBAN) 2 % 1 Application  1 Application Topical TID Rankin, Shuvon B, NP   1 Application at 62/56/38 1719   nicotine (NICODERM CQ - dosed in mg/24 hr) patch 7 mg  7 mg Transdermal Daily Rankin, Shuvon B, NP   7 mg at 12/01/21 0920   [START ON 12/03/2021] Vitamin D (Ergocalciferol) (DRISDOL) 1.25 MG (50000 UNIT) capsule 50,000 Units  50,000 Units Oral Q7 days Rankin, Shuvon B, NP       Current Outpatient Medications  Medication Sig Dispense Refill   Accu-Chek Softclix Lancets lancets Use as directed up to four times daily 100 each 0   apixaban (ELIQUIS) 5 MG TABS tablet Take 1 tablet (5 mg total) by mouth 2 (two) times daily. 60 tablet 0   ARIPiprazole (ABILIFY) 10 MG tablet Take 1 tablet (10 mg total) by mouth daily. 30 tablet 0   Blood Glucose Monitoring Suppl (ACCU-CHEK GUIDE) w/Device KIT Use as directed up to four times daily 1 kit 0   budesonide-formoterol (SYMBICORT) 160-4.5 MCG/ACT inhaler Inhale 2 puffs into the lungs in the morning and at bedtime.     Cholecalciferol (VITAMIN D3) 1.25 MG (50000 UT) CAPS Take 50,000 Units by mouth every Thursday.     cloZAPine (CLOZARIL) 25 MG tablet Take 3 tablets (75 mg total) by mouth at bedtime. 90 tablet 0   clozapine (CLOZARIL) 50 MG tablet Take 1 tablet (50 mg total) by mouth daily. 30 tablet 0   diltiazem (CARDIZEM CD) 240 MG 24 hr capsule Take 1 capsule (240 mg total) by mouth daily. (Patient not taking: Reported on 11/24/2021) 30 capsule 0    divalproex (DEPAKOTE ER) 500 MG 24 hr tablet Take 1 tablet (500 mg total) by mouth 2 (two) times daily. 60 tablet 0   glucose blood test strip Use as directed up to four times daily 50 each 0   haloperidol (HALDOL) 10 MG tablet Take 1 tablet (10 mg total) by mouth 3 (three) times daily as needed for agitation (and psychotic symptoms).     INGREZZA 40 MG capsule Take 1 capsule (40 mg total) by mouth in the morning. 30 capsule 0   insulin aspart (NOVOLOG) 100 UNIT/ML FlexPen Before each meal 3 times a day, 140-199 - 2 units, 200-250 - 4 units, 251-299 - 6 units,  300-349 - 8 units,  350 or above 10 units. Insulin PEN if approved, provide syringes and needles if needed.Please switch to any approved short acting Insulin if needed. 15 mL 0   insulin glargine (LANTUS) 100 UNIT/ML Solostar Pen Inject 12 Units into the skin daily. 15 mL 0   Insulin Pen Needle 32G X 4 MM MISC Use 4 times a day with insulin, 1 month supply. 100 each 0   latanoprost (XALATAN) 0.005 % ophthalmic solution Place 1 drop into both eyes at bedtime.     metFORMIN (GLUCOPHAGE) 500 MG tablet Take 500 mg by mouth 2 (two) times daily with a meal.     nicotine (NICODERM CQ - DOSED IN MG/24 HR) 7 mg/24hr patch Place 1 patch (7 mg total) onto the skin daily. 28 patch 0   PROAIR HFA 108 (90 Base) MCG/ACT inhaler Inhale 2 puffs into the lungs every 6 (six) hours as needed for wheezing or shortness of breath.      Labs  Lab Results:  Admission on 11/23/2021  Component Date Value Ref Range Status   SARS Coronavirus 2 by RT PCR 11/23/2021 NEGATIVE  NEGATIVE Final   Comment: (NOTE) SARS-CoV-2 target nucleic acids are NOT DETECTED.  The SARS-CoV-2 RNA is generally detectable in upper respiratory specimens during the acute phase of infection. The lowest concentration of SARS-CoV-2 viral copies this assay can detect is 138 copies/mL. A negative result does not preclude SARS-Cov-2 infection and should not be used as the sole basis for  treatment or other patient management decisions. A negative result may occur with  improper specimen collection/handling, submission of specimen other than nasopharyngeal swab, presence of viral mutation(s) within the areas targeted by this assay, and inadequate number of viral copies(<138 copies/mL). A negative result must be combined with clinical observations, patient history, and epidemiological information. The expected result is Negative.  Fact Sheet for Patients:  EntrepreneurPulse.com.au  Fact Sheet for Healthcare Providers:  IncredibleEmployment.be  This test is no                          t yet approved or cleared by the [place] FDA and  has been authorized for detection and/or diagnosis of SARS-CoV-2 by FDA under an Emergency Use Authorization (EUA). This EUA will remain  in effect (meaning this test can be used) for the duration of the COVID-19 declaration under Section 564(b)(1) of the Act, 21 U.S.C.section 360bbb-3(b)(1), unless the authorization is terminated  or revoked sooner.       Influenza A by PCR 11/23/2021 NEGATIVE  NEGATIVE Final   Influenza B by PCR 11/23/2021 NEGATIVE  NEGATIVE Final   Comment: (NOTE) The Xpert Xpress SARS-CoV-2/FLU/RSV plus assay is intended as an aid in the diagnosis of influenza from Nasopharyngeal swab specimens and should not be used as a sole basis for treatment. Nasal washings and aspirates are unacceptable for Xpert Xpress SARS-CoV-2/FLU/RSV testing.  Fact Sheet for Patients: EntrepreneurPulse.com.au  Fact Sheet for Healthcare Providers: IncredibleEmployment.be  This test is not yet approved or cleared by the [place] FDA and has been authorized for detection and/or diagnosis of SARS-CoV-2 by FDA under an Emergency Use Authorization (EUA). This EUA will remain in effect (meaning this test can be used) for the duration of the COVID-19  declaration under Section 564(b)(1) of the Act, 21 U.S.C. section 360bbb-3(b)(1), unless the authorization is terminated or revoked.  Performed at [facility], [place] [address], [place], [place] [address]    WBC 11/23/2021 7.4  4.0 - 10.5 K/uL Final   RBC 11/23/2021 4.25  3.87 - 5.11 MIL/uL Final   Hemoglobin 11/23/2021 11.1 (L)  12.0 - 15.0 g/dL Final   HCT 11/23/2021 34.6 (L)  36.0 - 46.0 % Final   MCV 11/23/2021 81.4  80.0 - 100.0 fL Final   MCH 11/23/2021 26.1  26.0 - 34.0 pg Final   MCHC 11/23/2021 32.1  30.0 - 36.0 g/dL Final   RDW 11/23/2021 17.7 (H)  11.5 - 15.5 % Final   Platelets 11/23/2021 365  150 - 400 K/uL Final   nRBC 11/23/2021 0.0  0.0 - 0.2 % Final   Neutrophils Relative % 11/23/2021 59  % Final   Neutro Abs 11/23/2021 4.3  1.7 - 7.7 K/uL Final   Lymphocytes Relative 11/23/2021 31  % Final   Lymphs Abs 11/23/2021 2.3  0.7 - 4.0 K/uL Final   Monocytes Relative 11/23/2021 7  % Final   Monocytes Absolute 11/23/2021 0.5  0.1 - 1.0 K/uL Final   Eosinophils Relative 11/23/2021 2  % Final   Eosinophils Absolute 11/23/2021 0.1  0.0 - 0.5  K/uL Final   Basophils Relative 11/23/2021 1  % Final   Basophils Absolute 11/23/2021 0.1  0.0 - 0.1 K/uL Final   Immature Granulocytes 11/23/2021 0  % Final   Abs Immature Granulocytes 11/23/2021 0.01  0.00 - 0.07 K/uL Final   Performed at [facility], [place] [address], [place], [place] [address]   Sodium 11/23/2021 139  135 - 145 mmol/L Final   Potassium 11/23/2021 4.1  3.5 - 5.1 mmol/L Final   Chloride 11/23/2021 105  98 - 111 mmol/L Final   CO2 11/23/2021 26  22 - 32 mmol/L Final   Glucose, Bld 11/23/2021 101 (H)  70 - 99 mg/dL Final   Glucose reference range applies only to samples taken after fasting for at least 8 hours.   BUN 11/23/2021 5 (L)  6 - 20 mg/dL Final   Creatinine, Ser 11/23/2021 0.51  0.44 - 1.00 mg/dL Final   Calcium 11/23/2021 9.0  8.9 - 10.3 mg/dL Final   Total Protein 11/23/2021 6.7  6.5 - 8.1 g/dL  Final   Albumin 11/23/2021 3.6  3.5 - 5.0 g/dL Final   AST 11/23/2021 24  15 - 41 U/L Final   ALT 11/23/2021 18  0 - 44 U/L Final   Alkaline Phosphatase 11/23/2021 62  38 - 126 U/L Final   Total Bilirubin 11/23/2021 0.4  0.3 - 1.2 mg/dL Final   GFR, Estimated 11/23/2021 >60  >60 mL/min Final   Comment: (NOTE) Calculated using the CKD-EPI Creatinine Equation (2021)    Anion gap 11/23/2021 8  5 - 15 Final   Performed at [place] [address] [place], [place] [address]   Magnesium 11/23/2021 2.0  1.7 - 2.4 mg/dL Final   Performed at [place] [address] [place], [place] [address]   Alcohol, Ethyl (B) 11/23/2021 <10  <10 mg/dL Final   Comment: (NOTE) Lowest detectable limit for serum alcohol is 10 mg/dL.  For medical purposes only. Performed at [facility], [place] [address] [place], [place] [address]    Prolactin 11/23/2021 3.8 (L)  4.8 - 23.3 ng/mL Final   Comment: (NOTE) Performed At: [facility] [place], [place] [id number] Rush Farmer MD UV:[id number]    Color, Urine 11/23/2021 YELLOW  YELLOW Final   APPearance 11/23/2021 CLEAR  CLEAR Final   Specific Gravity, Urine 11/23/2021 1.025  1.005 - 1.030 Final   pH 11/23/2021 6.5  5.0 - 8.0 Final   Glucose, UA 11/23/2021 NEGATIVE  NEGATIVE mg/dL Final   Hgb urine dipstick 11/23/2021 NEGATIVE  NEGATIVE Final   Bilirubin Urine 11/23/2021 NEGATIVE  NEGATIVE Final   Ketones, ur 11/23/2021 NEGATIVE  NEGATIVE mg/dL Final   Protein, ur 11/23/2021 NEGATIVE  NEGATIVE mg/dL Final   Nitrite 11/23/2021 NEGATIVE  NEGATIVE Final   Leukocytes,Ua 11/23/2021 NEGATIVE  NEGATIVE Final   Microscopic not done on urines with negative protein, blood, leukocytes, nitrite, or glucose < 500 mg/dL.   RBC / HPF 11/23/2021 0-5  0 - 5 RBC/hpf Final   WBC, UA 11/23/2021 0-5  0 - 5 WBC/hpf Final   Bacteria, UA 11/23/2021 NONE SEEN  NONE SEEN Final   Squamous Epithelial / LPF 11/23/2021 0-5  0 - 5 Final    Mucus 11/23/2021 PRESENT   Final   Performed at [facility], [place] [address] [place], [place] [address]   POC Amphetamine UR 11/23/2021 None Detected  NONE DETECTED (Cut Off Level 1000 ng/mL) Final  POC Secobarbital (BAR) 11/23/2021 None Detected  NONE DETECTED (Cut Off Level 300 ng/mL) Final   POC Buprenorphine (BUP) 11/23/2021 None Detected  NONE DETECTED (Cut Off Level 10 ng/mL) Final   POC Oxazepam (BZO) 11/23/2021 None Detected  NONE DETECTED (Cut Off Level 300 ng/mL) Final   POC Cocaine UR 11/23/2021 None Detected  NONE DETECTED (Cut Off Level 300 ng/mL) Final   POC Methamphetamine UR 11/23/2021 None Detected  NONE DETECTED (Cut Off Level 1000 ng/mL) Final   POC Morphine 11/23/2021 None Detected  NONE DETECTED (Cut Off Level 300 ng/mL) Final   POC Methadone UR 11/23/2021 None Detected  NONE DETECTED (Cut Off Level 300 ng/mL) Final   POC Oxycodone UR 11/23/2021 None Detected  NONE DETECTED (Cut Off Level 100 ng/mL) Final   POC Marijuana UR 11/23/2021 None Detected  NONE DETECTED (Cut Off Level 50 ng/mL) Final   SARSCOV2ONAVIRUS 2 AG 11/23/2021 NEGATIVE  NEGATIVE Final   Comment: (NOTE) SARS-CoV-2 antigen NOT DETECTED.   Negative results are presumptive.  Negative results do not preclude SARS-CoV-2 infection and should not be used as the sole basis for treatment or other patient management decisions, including infection  control decisions, particularly in the presence of clinical signs and  symptoms consistent with COVID-19, or in those who have been in contact with the virus.  Negative results must be combined with clinical observations, patient history, and epidemiological information. The expected result is Negative.  Fact Sheet for Patients: HandmadeRecipes.com.cy  Fact Sheet for Healthcare Providers: FuneralLife.at  This test is not yet approved or cleared by the [place] FDA and  has been authorized for detection  and/or diagnosis of SARS-CoV-2 by FDA under an Emergency Use Authorization (EUA).  This EUA will remain in effect (meaning this test can be used) for the duration of  the COV                          ID-19 declaration under Section 564(b)(1) of the Act, 21 U.S.C. section 360bbb-3(b)(1), unless the authorization is terminated or revoked sooner.     Cholesterol 11/23/2021 171  0 - 200 mg/dL Final   Triglycerides 11/23/2021 125  <150 mg/dL Final   HDL 11/23/2021 65  >40 mg/dL Final   Total CHOL/HDL Ratio 11/23/2021 2.6  RATIO Final   VLDL 11/23/2021 25  0 - 40 mg/dL Final   LDL Cholesterol 11/23/2021 81  0 - 99 mg/dL Final   Comment:        Total Cholesterol/HDL:CHD Risk Coronary Heart Disease Risk Table                     Men   Women  1/2 Average Risk   3.4   3.3  Average Risk       5.0   4.4  2 X Average Risk   9.6   7.1  3 X Average Risk  23.4   11.0        Use the calculated Patient Ratio above and the CHD Risk Table to determine the patient's CHD Risk.        ATP III CLASSIFICATION (LDL):  <100     mg/dL   Optimal  100-129  mg/dL   Near or Above                    Optimal  130-159  mg/dL   Borderline  160-189  mg/dL   High  >190  mg/dL   Very High Performed at [facility], [place] [address], [place], [place] [address]    TSH 11/23/2021 1.788  0.350 - 4.500 uIU/mL Final   Comment: Performed by a 3rd Generation assay with a functional sensitivity of <=0.01 uIU/mL. Performed at [facility], [place] [address] [place], [place] [address]    Glucose-Capillary 11/23/2021 137 (H)  70 - 99 mg/dL Final   Glucose reference range applies only to samples taken after fasting for at least 8 hours.   Glucose-Capillary 11/24/2021 144 (H)  70 - 99 mg/dL Final   Glucose reference range applies only to samples taken after fasting for at least 8 hours.   Glucose-Capillary 11/24/2021 87  70 - 99 mg/dL Final   Glucose reference range applies only to samples taken after fasting  for at least 8 hours.   Glucose-Capillary 11/24/2021 269 (H)  70 - 99 mg/dL Final   Glucose reference range applies only to samples taken after fasting for at least 8 hours.   Glucose-Capillary 11/25/2021 112 (H)  70 - 99 mg/dL Final   Glucose reference range applies only to samples taken after fasting for at least 8 hours.   Glucose-Capillary 11/25/2021 136 (H)  70 - 99 mg/dL Final   Glucose reference range applies only to samples taken after fasting for at least 8 hours.   Glucose-Capillary 11/25/2021 187 (H)  70 - 99 mg/dL Final   Glucose reference range applies only to samples taken after fasting for at least 8 hours.   Glucose-Capillary 11/25/2021 159 (H)  70 - 99 mg/dL Final   Glucose reference range applies only to samples taken after fasting for at least 8 hours.   Glucose-Capillary 11/26/2021 122 (H)  70 - 99 mg/dL Final   Glucose reference range applies only to samples taken after fasting for at least 8 hours.   Glucose-Capillary 11/26/2021 140 (H)  70 - 99 mg/dL Final   Glucose reference range applies only to samples taken after fasting for at least 8 hours.   Glucose-Capillary 11/26/2021 102 (H)  70 - 99 mg/dL Final   Glucose reference range applies only to samples taken after fasting for at least 8 hours.   Glucose-Capillary 11/26/2021 166 (H)  70 - 99 mg/dL Final   Glucose reference range applies only to samples taken after fasting for at least 8 hours.   Glucose-Capillary 11/27/2021 133 (H)  70 - 99 mg/dL Final   Glucose reference range applies only to samples taken after fasting for at least 8 hours.   Glucose-Capillary 11/27/2021 146 (H)  70 - 99 mg/dL Final   Glucose reference range applies only to samples taken after fasting for at least 8 hours.   Glucose-Capillary 11/27/2021 144 (H)  70 - 99 mg/dL Final   Glucose reference range applies only to samples taken after fasting for at least 8 hours.   Glucose-Capillary 11/27/2021 154 (H)  70 - 99 mg/dL Final   Glucose  reference range applies only to samples taken after fasting for at least 8 hours.   Glucose-Capillary 11/28/2021 107 (H)  70 - 99 mg/dL Final   Glucose reference range applies only to samples taken after fasting for at least 8 hours.   Glucose-Capillary 11/28/2021 166 (H)  70 - 99 mg/dL Final   Glucose reference range applies only to samples taken after fasting for at least 8 hours.   Glucose-Capillary 11/28/2021 89  70 - 99 mg/dL Final   Glucose reference range applies  only to samples taken after fasting for at least 8 hours.   Comment 1 11/28/2021 NURRP   Final   Glucose-Capillary 11/28/2021 243 (H)  70 - 99 mg/dL Final   Glucose reference range applies only to samples taken after fasting for at least 8 hours.   Glucose-Capillary 11/29/2021 193 (H)  70 - 99 mg/dL Final   Glucose reference range applies only to samples taken after fasting for at least 8 hours.   Glucose-Capillary 11/29/2021 164 (H)  70 - 99 mg/dL Final   Glucose reference range applies only to samples taken after fasting for at least 8 hours.   Glucose-Capillary 11/29/2021 192 (H)  70 - 99 mg/dL Final   Glucose reference range applies only to samples taken after fasting for at least 8 hours.   WBC 11/30/2021 5.8  4.0 - 10.5 K/uL Final   RBC 11/30/2021 4.40  3.87 - 5.11 MIL/uL Final   Hemoglobin 11/30/2021 11.3 (L)  12.0 - 15.0 g/dL Final   HCT 11/30/2021 35.2 (L)  36.0 - 46.0 % Final   MCV 11/30/2021 80.0  80.0 - 100.0 fL Final   MCH 11/30/2021 25.7 (L)  26.0 - 34.0 pg Final   MCHC 11/30/2021 32.1  30.0 - 36.0 g/dL Final   RDW 11/30/2021 17.2 (H)  11.5 - 15.5 % Final   Platelets 11/30/2021 314  150 - 400 K/uL Final   nRBC 11/30/2021 0.0  0.0 - 0.2 % Final   Neutrophils Relative % 11/30/2021 52  % Final   Neutro Abs 11/30/2021 2.9  1.7 - 7.7 K/uL Final   Lymphocytes Relative 11/30/2021 38  % Final   Lymphs Abs 11/30/2021 2.2  0.7 - 4.0 K/uL Final   Monocytes Relative 11/30/2021 7  % Final   Monocytes Absolute  11/30/2021 0.4  0.1 - 1.0 K/uL Final   Eosinophils Relative 11/30/2021 2  % Final   Eosinophils Absolute 11/30/2021 0.1  0.0 - 0.5 K/uL Final   Basophils Relative 11/30/2021 1  % Final   Basophils Absolute 11/30/2021 0.1  0.0 - 0.1 K/uL Final   Immature Granulocytes 11/30/2021 0  % Final   Abs Immature Granulocytes 11/30/2021 0.01  0.00 - 0.07 K/uL Final   Performed at [facility], [place] [address], [place], [place] [address]   Glucose-Capillary 11/29/2021 150 (H)  70 - 99 mg/dL Final   Glucose reference range applies only to samples taken after fasting for at least 8 hours.   Glucose-Capillary 11/30/2021 120 (H)  70 - 99 mg/dL Final   Glucose reference range applies only to samples taken after fasting for at least 8 hours.   Glucose-Capillary 11/30/2021 86  70 - 99 mg/dL Final   Glucose reference range applies only to samples taken after fasting for at least 8 hours.   Glucose-Capillary 11/30/2021 123 (H)  70 - 99 mg/dL Final   Glucose reference range applies only to samples taken after fasting for at least 8 hours.   Glucose-Capillary 11/30/2021 161 (H)  70 - 99 mg/dL Final   Glucose reference range applies only to samples taken after fasting for at least 8 hours.   Glucose-Capillary 12/01/2021 123 (H)  70 - 99 mg/dL Final   Glucose reference range applies only to samples taken after fasting for at least 8 hours.   Glucose-Capillary 12/01/2021 193 (H)  70 - 99 mg/dL Final   Glucose reference range applies only to samples taken after fasting for at least 8 hours.   Glucose-Capillary 12/01/2021  161 (H)  70 - 99 mg/dL Final   Glucose reference range applies only to samples taken after fasting for at least 8 hours.  Admission on 11/22/2021, Discharged on 11/22/2021  Component Date Value Ref Range Status   Glucose-Capillary 11/22/2021 169 (H)  70 - 99 mg/dL Final   Glucose reference range applies only to samples taken after fasting for at least 8 hours.  No results displayed because  visit has over 200 results.    Admission on 10/04/2021, Discharged on 10/22/2021  Component Date Value Ref Range Status   SARS Coronavirus 2 by RT PCR 10/04/2021 NEGATIVE  NEGATIVE Final   Comment: (NOTE) SARS-CoV-2 target nucleic acids are NOT DETECTED.  The SARS-CoV-2 RNA is generally detectable in upper respiratory specimens during the acute phase of infection. The lowest concentration of SARS-CoV-2 viral copies this assay can detect is 138 copies/mL. A negative result does not preclude SARS-Cov-2 infection and should not be used as the sole basis for treatment or other patient management decisions. A negative result may occur with  improper specimen collection/handling, submission of specimen other than nasopharyngeal swab, presence of viral mutation(s) within the areas targeted by this assay, and inadequate number of viral copies(<138 copies/mL). A negative result must be combined with clinical observations, patient history, and epidemiological information. The expected result is Negative.  Fact Sheet for Patients:  EntrepreneurPulse.com.au  Fact Sheet for Healthcare Providers:  IncredibleEmployment.be  This test is no                          t yet approved or cleared by the [place] FDA and  has been authorized for detection and/or diagnosis of SARS-CoV-2 by FDA under an Emergency Use Authorization (EUA). This EUA will remain  in effect (meaning this test can be used) for the duration of the COVID-19 declaration under Section 564(b)(1) of the Act, 21 U.S.C.section 360bbb-3(b)(1), unless the authorization is terminated  or revoked sooner.       Influenza A by PCR 10/04/2021 NEGATIVE  NEGATIVE Final   Influenza B by PCR 10/04/2021 NEGATIVE  NEGATIVE Final   Comment: (NOTE) The Xpert Xpress SARS-CoV-2/FLU/RSV plus assay is intended as an aid in the diagnosis of influenza from Nasopharyngeal swab specimens and should not be used as  a sole basis for treatment. Nasal washings and aspirates are unacceptable for Xpert Xpress SARS-CoV-2/FLU/RSV testing.  Fact Sheet for Patients: EntrepreneurPulse.com.au  Fact Sheet for Healthcare Providers: IncredibleEmployment.be  This test is not yet approved or cleared by the [place] FDA and has been authorized for detection and/or diagnosis of SARS-CoV-2 by FDA under an Emergency Use Authorization (EUA). This EUA will remain in effect (meaning this test can be used) for the duration of the COVID-19 declaration under Section 564(b)(1) of the Act, 21 U.S.C. section 360bbb-3(b)(1), unless the authorization is terminated or revoked.  Performed at [facility], [place] [address], [place], [place] [address]    WBC 10/04/2021 8.4  4.0 - 10.5 K/uL Final   RBC 10/04/2021 4.42  3.87 - 5.11 MIL/uL Final   Hemoglobin 10/04/2021 11.6 (L)  12.0 - 15.0 g/dL Final   HCT 10/04/2021 35.7 (L)  36.0 - 46.0 % Final   MCV 10/04/2021 80.8  80.0 - 100.0 fL Final   MCH 10/04/2021 26.2  26.0 - 34.0 pg Final   MCHC 10/04/2021 32.5  30.0 - 36.0 g/dL Final   RDW 10/04/2021 16.0 (H)  11.5 - 15.5 % Final  Platelets 10/04/2021 372  150 - 400 K/uL Final   nRBC 10/04/2021 0.0  0.0 - 0.2 % Final   Neutrophils Relative % 10/04/2021 68  % Final   Neutro Abs 10/04/2021 5.7  1.7 - 7.7 K/uL Final   Lymphocytes Relative 10/04/2021 22  % Final   Lymphs Abs 10/04/2021 1.8  0.7 - 4.0 K/uL Final   Monocytes Relative 10/04/2021 8  % Final   Monocytes Absolute 10/04/2021 0.7  0.1 - 1.0 K/uL Final   Eosinophils Relative 10/04/2021 1  % Final   Eosinophils Absolute 10/04/2021 0.1  0.0 - 0.5 K/uL Final   Basophils Relative 10/04/2021 1  % Final   Basophils Absolute 10/04/2021 0.1  0.0 - 0.1 K/uL Final   Immature Granulocytes 10/04/2021 0  % Final   Abs Immature Granulocytes 10/04/2021 0.03  0.00 - 0.07 K/uL Final   Performed at [facility], [place] [address],  [place], [place] [address]   Sodium 10/04/2021 136  135 - 145 mmol/L Final   Potassium 10/04/2021 4.2  3.5 - 5.1 mmol/L Final   Chloride 10/04/2021 104  98 - 111 mmol/L Final   CO2 10/04/2021 25  22 - 32 mmol/L Final   Glucose, Bld 10/04/2021 100 (H)  70 - 99 mg/dL Final   Glucose reference range applies only to samples taken after fasting for at least 8 hours.   BUN 10/04/2021 9  6 - 20 mg/dL Final   Creatinine, Ser 10/04/2021 0.52  0.44 - 1.00 mg/dL Final   Calcium 10/04/2021 9.0  8.9 - 10.3 mg/dL Final   Total Protein 10/04/2021 7.0  6.5 - 8.1 g/dL Final   Albumin 10/04/2021 3.2 (L)  3.5 - 5.0 g/dL Final   AST 10/04/2021 13 (L)  15 - 41 U/L Final   ALT 10/04/2021 10  0 - 44 U/L Final   Alkaline Phosphatase 10/04/2021 61  38 - 126 U/L Final   Total Bilirubin 10/04/2021 0.3  0.3 - 1.2 mg/dL Final   GFR, Estimated 10/04/2021 >60  >60 mL/min Final   Comment: (NOTE) Calculated using the CKD-EPI Creatinine Equation (2021)    Anion gap 10/04/2021 7  5 - 15 Final   Performed at [facility] [address], [place], [place] [address]   Hgb A1c MFr Bld 10/04/2021 7.0 (H)  4.8 - 5.6 % Final   Comment: (NOTE) Pre diabetes:          5.7%-6.4%  Diabetes:              >6.4%  Glycemic control for   <7.0% adults with diabetes    Mean Plasma Glucose 10/04/2021 154.2  mg/dL Final   Performed at [facility], [place] [address] [place], [place] [address]   Cholesterol 10/04/2021 178  0 - 200 mg/dL Final   Triglycerides 10/04/2021 155 (H)  <150 mg/dL Final   HDL 10/04/2021 52  >40 mg/dL Final   Total CHOL/HDL Ratio 10/04/2021 3.4  RATIO Final   VLDL 10/04/2021 31  0 - 40 mg/dL Final   LDL Cholesterol 10/04/2021 95  0 - 99 mg/dL Final   Comment:        Total Cholesterol/HDL:CHD Risk Coronary Heart Disease Risk Table                     Men   Women  1/2 Average Risk   3.4   3.3  Average Risk       5.0  4.4  2 X Average Risk   9.6   7.1  3 X Average Risk  23.4   11.0        Use the  calculated Patient Ratio above and the CHD Risk Table to determine the patient's CHD Risk.        ATP III CLASSIFICATION (LDL):  <100     mg/dL   Optimal  100-129  mg/dL   Near or Above                    Optimal  130-159  mg/dL   Borderline  160-189  mg/dL   High  >190     mg/dL   Very High Performed at [facility] [address], [place], [place] [address]    POC Amphetamine UR 10/04/2021 None Detected  NONE DETECTED (Cut Off Level 1000 ng/mL) Final   POC Secobarbital (BAR) 10/04/2021 None Detected  NONE DETECTED (Cut Off Level 300 ng/mL) Final   POC Buprenorphine (BUP) 10/04/2021 None Detected  NONE DETECTED (Cut Off Level 10 ng/mL) Final   POC Oxazepam (BZO) 10/04/2021 None Detected  NONE DETECTED (Cut Off Level 300 ng/mL) Final   POC Cocaine UR 10/04/2021 None Detected  NONE DETECTED (Cut Off Level 300 ng/mL) Final   POC Methamphetamine UR 10/04/2021 None Detected  NONE DETECTED (Cut Off Level 1000 ng/mL) Final   POC Morphine 10/04/2021 None Detected  NONE DETECTED (Cut Off Level 300 ng/mL) Final   POC Methadone UR 10/04/2021 None Detected  NONE DETECTED (Cut Off Level 300 ng/mL) Final   POC Oxycodone UR 10/04/2021 Positive (A)  NONE DETECTED (Cut Off Level 100 ng/mL) Final   POC Marijuana UR 10/04/2021 None Detected  NONE DETECTED (Cut Off Level 50 ng/mL) Final   SARSCOV2ONAVIRUS 2 AG 10/04/2021 NEGATIVE  NEGATIVE Final   Comment: (NOTE) SARS-CoV-2 antigen NOT DETECTED.   Negative results are presumptive.  Negative results do not preclude SARS-CoV-2 infection and should not be used as the sole basis for treatment or other patient management decisions, including infection  control decisions, particularly in the presence of clinical signs and  symptoms consistent with COVID-19, or in those who have been in contact with the virus.  Negative results must be combined with clinical observations, patient history, and epidemiological information. The expected result is  Negative.  Fact Sheet for Patients: HandmadeRecipes.com.cy  Fact Sheet for Healthcare Providers: FuneralLife.at  This test is not yet approved or cleared by the [place] FDA and  has been authorized for detection and/or diagnosis of SARS-CoV-2 by FDA under an Emergency Use Authorization (EUA).  This EUA will remain in effect (meaning this test can be used) for the duration of  the COV                          ID-19 declaration under Section 564(b)(1) of the Act, 21 U.S.C. section 360bbb-3(b)(1), unless the authorization is terminated or revoked sooner.     Valproic Acid Lvl 10/04/2021 51  50.0 - 100.0 ug/mL Final   Performed at [facility] [address], [place], [place] [address]   Valproic Acid Lvl 10/08/2021 57  50.0 - 100.0 ug/mL Final   Performed at [facility] [address] [place], [place] [address]   Glucose-Capillary 10/09/2021 123 (H)  70 - 99 mg/dL Final   Glucose reference range applies only to samples taken after fasting for at least 8 hours.  Admission on 09/09/2021, Discharged on 09/10/2021  Component Date Value Ref Range Status   Sodium 09/09/2021 134 (L)  135 - 145 mmol/L Final   Potassium 09/09/2021 4.3  3.5 - 5.1 mmol/L Final   Chloride 09/09/2021 99  98 - 111 mmol/L Final   CO2 09/09/2021 25  22 - 32 mmol/L Final   Glucose, Bld 09/09/2021 107 (H)  70 - 99 mg/dL Final   Glucose reference range applies only to samples taken after fasting for at least 8 hours.   BUN 09/09/2021 12  6 - 20 mg/dL Final   Creatinine, Ser 09/09/2021 0.74  0.44 - 1.00 mg/dL Final   Calcium 09/09/2021 9.0  8.9 - 10.3 mg/dL Final   Total Protein 09/09/2021 7.4  6.5 - 8.1 g/dL Final   Albumin 09/09/2021 3.1 (L)  3.5 - 5.0 g/dL Final   AST 09/09/2021 13 (L)  15 - 41 U/L Final   ALT 09/09/2021 13  0 - 44 U/L Final   Alkaline Phosphatase 09/09/2021 66  38 - 126 U/L Final   Total Bilirubin 09/09/2021 0.2 (L)  0.3 - 1.2  mg/dL Final   GFR, Estimated 09/09/2021 >60  >60 mL/min Final   Comment: (NOTE) Calculated using the CKD-EPI Creatinine Equation (2021)    Anion gap 09/09/2021 10  5 - 15 Final   Performed at [facility], [place] [address], [place], [place] [address]   WBC 09/09/2021 8.5  4.0 - 10.5 K/uL Final   RBC 09/09/2021 4.53  3.87 - 5.11 MIL/uL Final   Hemoglobin 09/09/2021 11.8 (L)  12.0 - 15.0 g/dL Final   HCT 09/09/2021 38.0  36.0 - 46.0 % Final   MCV 09/09/2021 83.9  80.0 - 100.0 fL Final   MCH 09/09/2021 26.0  26.0 - 34.0 pg Final   MCHC 09/09/2021 31.1  30.0 - 36.0 g/dL Final   RDW 09/09/2021 17.8 (H)  11.5 - 15.5 % Final   Platelets 09/09/2021 442 (H)  150 - 400 K/uL Final   nRBC 09/09/2021 0.0  0.0 - 0.2 % Final   Neutrophils Relative % 09/09/2021 70  % Final   Neutro Abs 09/09/2021 5.9  1.7 - 7.7 K/uL Final   Lymphocytes Relative 09/09/2021 20  % Final   Lymphs Abs 09/09/2021 1.7  0.7 - 4.0 K/uL Final   Monocytes Relative 09/09/2021 8  % Final   Monocytes Absolute 09/09/2021 0.7  0.1 - 1.0 K/uL Final   Eosinophils Relative 09/09/2021 1  % Final   Eosinophils Absolute 09/09/2021 0.1  0.0 - 0.5 K/uL Final   Basophils Relative 09/09/2021 1  % Final   Basophils Absolute 09/09/2021 0.1  0.0 - 0.1 K/uL Final   Immature Granulocytes 09/09/2021 0  % Final   Abs Immature Granulocytes 09/09/2021 0.03  0.00 - 0.07 K/uL Final   Performed at [facility], [place] [address] [place], [place] [address]   Ammonia 09/09/2021 37 (H)  9 - 35 umol/L Final   Comment: HEMOLYSIS AT THIS LEVEL MAY AFFECT RESULT Performed at [facility], [place] [address], [place], [place] [address]    Opiates 09/09/2021 NONE DETECTED  NONE DETECTED Final   Cocaine 09/09/2021 NONE DETECTED  NONE DETECTED Final   Benzodiazepines 09/09/2021 NONE DETECTED  NONE DETECTED Final   Amphetamines 09/09/2021 NONE DETECTED  NONE DETECTED Final   Tetrahydrocannabinol 09/09/2021 NONE DETECTED  NONE DETECTED Final    Barbiturates 09/09/2021 NONE DETECTED  NONE DETECTED Final   Comment: (NOTE)  DRUG SCREEN FOR MEDICAL PURPOSES ONLY.  IF CONFIRMATION IS NEEDED FOR ANY PURPOSE, NOTIFY LAB WITHIN 5 DAYS.  LOWEST DETECTABLE LIMITS FOR URINE DRUG SCREEN Drug Class                     Cutoff (ng/mL) Amphetamine and metabolites    1000 Barbiturate and metabolites    200 Benzodiazepine                 973 Tricyclics and metabolites     300 Opiates and metabolites        300 Cocaine and metabolites        300 THC                            50 Performed at [facility], [place] [address], [place], [place] [address]    Alcohol, Ethyl (B) 09/09/2021 <10  <10 mg/dL Final   Comment: (NOTE) Lowest detectable limit for serum alcohol is 10 mg/dL.  For medical purposes only. Performed at [facility], [place] [address], [place], [place] [address]    Lipase 09/09/2021 33  11 - 51 U/L Final   Performed at [facility] [address], [place], [place] [address]   Color, Urine 09/09/2021 COLORLESS (A)  YELLOW Final   APPearance 09/09/2021 CLEAR  CLEAR Final   Specific Gravity, Urine 09/09/2021 1.002 (L)  1.005 - 1.030 Final   pH 09/09/2021 6.0  5.0 - 8.0 Final   Glucose, UA 09/09/2021 NEGATIVE  NEGATIVE mg/dL Final   Hgb urine dipstick 09/09/2021 NEGATIVE  NEGATIVE Final   Bilirubin Urine 09/09/2021 NEGATIVE  NEGATIVE Final   Ketones, ur 09/09/2021 NEGATIVE  NEGATIVE mg/dL Final   Protein, ur 09/09/2021 NEGATIVE  NEGATIVE mg/dL Final   Nitrite 09/09/2021 NEGATIVE  NEGATIVE Final   Leukocytes,Ua 09/09/2021 NEGATIVE  NEGATIVE Final   Performed at [facility] [address], [place], [place] [address]   Specimen Description 09/09/2021 URINE, CLEAN CATCH   Final   Special Requests 09/09/2021 NONE   Final   Culture 09/09/2021  (A)   Final                   Value:<10,000 COLONIES/mL INSIGNIFICANT GROWTH Performed at [facility], [place] [address], [place], [place] [address]    Report  Status 09/09/2021 09/10/2021 FINAL   Final   D-Dimer, Quant 09/09/2021 0.92 (H)  0.00 - 0.50 ug/mL-FEU Final   Comment: (NOTE) At the manufacturer cut-off value of 0.5 g/mL FEU, this assay has a negative predictive value of 95-100%.This assay is intended for use in conjunction with a clinical pretest probability (PTP) assessment model to exclude pulmonary embolism (PE) and deep venous thrombosis (DVT) in outpatients suspected of PE or DVT. Results should be correlated with clinical presentation. Performed at [facility], [place] [address], [place], [place] [address]    Troponin I (High Sensitivity) 09/09/2021 5  <18 ng/L Final   Comment: (NOTE) Elevated high sensitivity troponin I (hsTnI) values and significant  changes across serial measurements may suggest ACS but many other  chronic and acute conditions are known to elevate hsTnI results.  Refer to the "Links" section for chest pain algorithms and additional  guidance. Performed at [facility], [place] [address], [place], [place] [address]    BP 09/10/2021 135/83  mmHg Final   S' Lateral 09/10/2021 2.30  cm Final   Area-P 1/2 09/10/2021 5.58  cm2 Final    Blood Alcohol level:  Lab Results  Component Value Date   ETH <10 11/23/2021   ETH <10 77/93/9688    Metabolic Disorder Labs: Lab Results  Component Value Date   HGBA1C 7.0 (H) 10/04/2021   MPG 154.2 10/04/2021   Lab Results  Component Value Date   PROLACTIN 3.8 (L) 11/23/2021   Lab Results  Component Value Date   CHOL 171 11/23/2021   TRIG 125 11/23/2021   HDL 65 11/23/2021   CHOLHDL 2.6 11/23/2021   VLDL 25 11/23/2021   LDLCALC 81 11/23/2021   LDLCALC 95 10/04/2021    Therapeutic Lab Levels: No results found for: "LITHIUM" Lab Results  Component Value Date   VALPROATE 45 (L) 11/09/2021   VALPROATE 57 10/08/2021   No results found for: "CBMZ"  Physical Findings   PHQ2-9    Flowsheet Row ED from 11/23/2021 in [facility]  PHQ-2 Total Score 2  PHQ-9 Total Score 3      Flowsheet Row ED from 11/23/2021 in [facility] Most recent reading at 11/23/2021  6:44 PM ED from 11/22/2021 in [facility] Most recent reading at 11/22/2021  8:28 PM ED from 11/22/2021 in [facility] Most recent reading at 11/22/2021  1:24 AM  C-SSRS RISK CATEGORY No Risk No Risk No Risk        Musculoskeletal  Strength & Muscle Tone: within normal limits Gait & Station: normal Patient leans: N/A  Psychiatric Specialty Exam  Presentation  General Appearance:  Appropriate for Environment; Casual  Eye Contact: Good  Speech: Clear and Coherent; Normal Rate  Speech Volume: Normal  Handedness: Right   Mood and Affect  Mood: Euthymic  Affect: Congruent   Thought Process  Thought Processes: Coherent  Descriptions of Associations:Intact  Orientation:Full (Time, Place and Person)  Thought Content:Logical  Diagnosis of Schizophrenia or Schizoaffective disorder in past: Yes  Duration of Psychotic Symptoms: No data recorded  Hallucinations:Hallucinations: None  Ideas of Reference:None  Suicidal Thoughts:Suicidal Thoughts: No  Homicidal Thoughts:Homicidal Thoughts: No   Sensorium  Memory: Immediate Good; Recent Good; Remote Good  Judgment: Intact  Insight: Shallow   Executive Functions  Concentration: Fair  Attention Span: Fair  Recall: [place] of Knowledge: Fair  Language: Fair   Psychomotor Activity  Psychomotor Activity: Psychomotor Activity: Normal   Assets  Assets: Physical Health; Resilience; Social Support   Sleep  Sleep: Sleep: Good   Nutritional Assessment (For OBS and FBC admissions only) Has the patient had a weight loss or gain of 10 pounds or more in the last 3 months?: No Has the patient had a decrease in food intake/or appetite?: No Does the patient  have dental problems?: No Does the patient have eating habits or behaviors that may be indicators of an eating disorder including binging or inducing vomiting?: No Has the patient recently lost weight without trying?: 0 Has the patient been eating poorly because of a decreased appetite?: 0 Malnutrition Screening Tool Score: 0    Physical Exam  Physical Exam Vitals and nursing note reviewed.  Constitutional:      General: She is not in acute distress.    Appearance: Normal appearance. She is not ill-appearing.  HENT:     Head: Normocephalic.  Eyes:     General:        Right eye: No discharge.  Left eye: No discharge.     Conjunctiva/sclera: Conjunctivae normal.  Cardiovascular:     Rate and Rhythm: Normal rate.  Pulmonary:     Effort: Pulmonary effort is normal.  Musculoskeletal:        General: Normal range of motion.     Cervical back: Normal range of motion.  Skin:    Coloration: Skin is not jaundiced or pale.  Neurological:     Mental Status: She is alert and oriented to person, place, and time.  Psychiatric:        Attention and Perception: Attention and perception normal.        Mood and Affect: Affect normal.        Speech: Speech normal.        Behavior: Behavior normal. Behavior is cooperative.        Thought Content: Thought content normal.        Cognition and Memory: Cognition normal.        Judgment: Judgment normal.    Review of Systems  Constitutional: Negative.   HENT: Negative.    Eyes: Negative.   Respiratory: Negative.    Cardiovascular: Negative.   Musculoskeletal: Negative.   Skin: Negative.   Neurological: Negative.   Psychiatric/Behavioral: Negative.     Blood pressure 130/77, pulse 100, temperature 97.9 F (36.6 C), temperature source Oral, resp. rate 18, SpO2 99 %. There is no height or weight on file to calculate BMI.  Treatment Plan Summary: Patient has been psychiatrically cleared and continues boarding in the continuous  assessment unit at [facility] while waiting for DSS to find appropriate housing. Daily contact with patient to assess and evaluate symptoms and progress in treatment, Medication management, and Plan Social work/TOC to continue working with DSS to find appropriate housing.    Revonda Humphrey, NP 12/01/2021 6:38 PM

## 2021-12-01 NOTE — Care Management (Signed)
Care Management   Writer received an update from [place] with [facility] [phone]).  Alyse Low reports that the [place] [facility] is now unwilling to take the patient back to the [facility].  Alyse Low proposed the option of the patient going to a shelter.  Writer and the Market researcher informed the Alyse Low that a shelter was not option for this patient.    Alyse Low reports that she will be actively looking for an alternative placement for the patient.  Alyse Low reports that she does not have an estimated date that the patient will be able to discharge safely to another facility.

## 2021-12-01 NOTE — ED Notes (Signed)
Patient continues to watch TV - no sxs of distress noted - will continue to monitor

## 2021-12-01 NOTE — Care Management (Signed)
Care Management  Writer received a follow up phone call from [facility].  The new Care Manager working with Teresa Murillo is Teresa Murillo and her mobile number is [phone].    Teresa Murillo reports she has spoken to the previous provider that the previous facility is willing to take the patient back under the following conditions.  A medication change to caplyta.  Per the owner of the group home the patient behaviors deteriorated when this medication was switched to another medication.  Legal guardianship - The patient will need to have a legal guardian.

## 2021-12-01 NOTE — ED Notes (Signed)
Patient is denying SI, HI, AVH - will continue to monitor for safety

## 2021-12-01 NOTE — ED Notes (Signed)
Patient laying down - no sxs of distress noted at this

## 2021-12-02 ENCOUNTER — Encounter (HOSPITAL_COMMUNITY): Payer: Self-pay | Admitting: Registered Nurse

## 2021-12-02 LAB — GLUCOSE, CAPILLARY
Glucose-Capillary: 198 mg/dL — ABNORMAL HIGH (ref 70–99)
Glucose-Capillary: 79 mg/dL (ref 70–99)
Glucose-Capillary: 159 mg/dL — ABNORMAL HIGH (ref 70–99)

## 2021-12-02 NOTE — ED Notes (Signed)
Remains food focused bu is redirectable affect remains flat mood appropriate.

## 2021-12-02 NOTE — ED Provider Notes (Signed)
Behavioral Health Progress Note  Date and Time: 12/02/2021 2:51 PM Name: Teresa Murillo MRN:  [id number]  HPI:  Teresa Murillo 61 year old female with a past psychiatric history of schizophrenia, aggressive behavior, and possible intellectual disability presented to [facility] on 11/23/21 voluntarily as a walk in via [facility] with complaints that she was locked out of the group home.  Reports that owner of group home called police but doesn't know why.  Group homeowner [name] at [phone] reports patient only living there temporarily while DSS look for another place to live.  Reports patient has already been discharged from facility. Reported unwilling to take back unless patient has a guardian related to patient leaving, staying gone for days and missing doses of medications.  Reports that patient is unable to care for herself, needs reminders to shower and complete ADL, also needs assistance with medication management.     Patient unable to return to group home unless she has been assigned a guardian.  Patient unable to care for herself and has been boarding on the continuous assessment unit while awaiting DSS caseworker to find appropriate placement.  Social work/TOC working with DSS to find placement.    Subjective:  "I feel good"  [name], seen face to face by this provider for reassessment, consulted with Dr. Hampton Abbot; and chart reviewed on 12/02/21.  On evaluation Teresa Murillo  reports she continues to do well.  States that she wants to go outside for a little while today.   She continues to deny suicidal/self-harm/homicidal ideation, psychosis, and paranoia.  States that she continues to eat/sleep without difficulty.  Reports she is taking medications as ordered and has had no adverse reaction.   During evaluation Teresa Murillo is sitting up in bed with no noted distress.  She is alert, oriented x 4, calm, cooperative and attentive.  Her mood is euthymic with  congruent affect.  She has normal speech, and behavior.  Objectively there is no evidence of psychosis/mania or delusional thinking.  Patient is able to converse coherently, goal directed thoughts, no distractibility, or pre-occupation.  She also denies suicidal/self-harm/homicidal ideation, psychosis, and paranoia.    Diagnosis:  Final diagnoses:  At risk for self care deficit  Noncompliance  Self-care deficit for medication administration  Schizophrenia, unspecified type ([place])    Total Time spent with patient: 15 minutes  Past Psychiatric History: schizophrenia, aggressive behavior, and possible intellectual disability presented  Past Medical History:  Past Medical History:  Diagnosis Date   Chronic obstructive pulmonary disease (COPD) ([employer])    Glaucoma    Hyperlipidemia    Hypertension    Iron deficiency    Schizoaffective disorder ([place])    Type 2 diabetes mellitus ([place])     Past Surgical History:  Procedure Laterality Date   TUBAL LIGATION     Family History:  Family History  Problem Relation Age of Onset   Breast cancer Maternal Grandmother    Family Psychiatric  History: Unaware Social History:  Social History   Substance and Sexual Activity  Alcohol Use Yes     Social History   Substance and Sexual Activity  Drug Use Not Currently    Social History   Socioeconomic History   Marital status: Divorced    Spouse name: Not on file   Number of children: Not on file   Years of education: Not on file   Highest education level: Not on file  Occupational History   Not on file  Tobacco Use  Smoking status: Every Day    Packs/day: 1.00    Types: Cigars, Cigarettes   Smokeless tobacco: Current  Vaping Use   Vaping Use: Never used  Substance and Sexual Activity   Alcohol use: Yes   Drug use: Not Currently   Sexual activity: Not Currently    Birth control/protection: Surgical  Other Topics Concern   Not on file  Social History Narrative   Not on file    Social Determinants of Health   Financial Resource Strain: Not on file  Food Insecurity: Not on file  Transportation Needs: Not on file  Physical Activity: Not on file  Stress: Not on file  Social Connections: Not on file   SDOH:  SDOH Screenings   Depression (PHQ2-9): Low Risk  (11/23/2021)  Tobacco Use: High Risk (12/02/2021)   Additional Social History:    Pain Medications: See MAR Prescriptions: See MAR Over the Counter: See MAR History of alcohol / drug use?: No history of alcohol / drug abuse Longest period of sobriety (when/how long): N/A     Sleep: Good  Appetite:  Good  Current Medications:  Current Facility-Administered Medications  Medication Dose Route Frequency Provider Last Rate Last Admin   acetaminophen (TYLENOL) tablet 650 mg  650 mg Oral Q6H PRN Saulo Anthis B, NP   650 mg at 11/28/21 2015   albuterol (VENTOLIN HFA) 108 (90 Base) MCG/ACT inhaler 2 puff  2 puff Inhalation Q6H PRN Carmeron Heady B, NP       alum & mag hydroxide-simeth (MAALOX/MYLANTA) 200-200-20 MG/5ML suspension 30 mL  30 mL Oral Q4H PRN Kyree Adriano B, NP       apixaban (ELIQUIS) tablet 5 mg  5 mg Oral BID Hershell Brandl B, NP   5 mg at 12/02/21 0907   cloZAPine (CLOZARIL) tablet 50 mg  50 mg Oral Daily [name], NP   50 mg at 12/02/21 0906   cloZAPine (CLOZARIL) tablet 75 mg  75 mg Oral QHS [name], NP   75 mg at 12/01/21 2156   diltiazem (CARDIZEM CD) 24 hr capsule 240 mg  240 mg Oral Daily Taahir Grisby B, NP   240 mg at 12/02/21 0907   divalproex (DEPAKOTE ER) 24 hr tablet 500 mg  500 mg Oral BID Javarion Douty B, NP   500 mg at 12/02/21 0907   fluticasone furoate-vilanterol (BREO ELLIPTA) 200-25 MCG/ACT 1 puff  1 puff Inhalation Daily Kaoru Benda B, NP   1 puff at 12/02/21 0804   haloperidol (HALDOL) tablet 10 mg  10 mg Oral Q8H PRN Carver Murakami B, NP       insulin aspart (novoLOG) injection 0-15 Units  0-15 Units Subcutaneous TID WC Kalan Rinn B, NP   3  Units at 12/02/21 0805   insulin glargine-yfgn (SEMGLEE) injection 12 Units  12 Units Subcutaneous Daily Kandie Keiper B, NP   12 Units at 12/02/21 0905   latanoprost (XALATAN) 0.005 % ophthalmic solution 1 drop  1 drop Both Eyes QHS Yamile Roedl B, NP   1 drop at 12/01/21 2157   magnesium hydroxide (MILK OF MAGNESIA) suspension 30 mL  30 mL Oral Daily PRN Jinelle Butchko B, NP       metFORMIN (GLUCOPHAGE) tablet 500 mg  500 mg Oral BID WC Avary Eichenberger B, NP   500 mg at 12/02/21 0805   mupirocin ointment (BACTROBAN) 2 % 1 Application  1 Application Topical TID Marlisha Vanwyk B, NP   1 Application at 25/85/27 [phone]  nicotine (NICODERM CQ - dosed in mg/24 hr) patch 7 mg  7 mg Transdermal Daily Kerrilyn Azbill B, NP   7 mg at 12/02/21 0908   [START ON 12/03/2021] Vitamin D (Ergocalciferol) (DRISDOL) 1.25 MG (50000 UNIT) capsule 50,000 Units  50,000 Units Oral Q7 days Isaak Delmundo B, NP       Current Outpatient Medications  Medication Sig Dispense Refill   Accu-Chek Softclix Lancets lancets Use as directed up to four times daily 100 each 0   apixaban (ELIQUIS) 5 MG TABS tablet Take 1 tablet (5 mg total) by mouth 2 (two) times daily. 60 tablet 0   ARIPiprazole (ABILIFY) 10 MG tablet Take 1 tablet (10 mg total) by mouth daily. 30 tablet 0   Blood Glucose Monitoring Suppl (ACCU-CHEK GUIDE) w/Device KIT Use as directed up to four times daily 1 kit 0   budesonide-formoterol (SYMBICORT) 160-4.5 MCG/ACT inhaler Inhale 2 puffs into the lungs in the morning and at bedtime.     Cholecalciferol (VITAMIN D3) 1.25 MG (50000 UT) CAPS Take 50,000 Units by mouth every Thursday.     cloZAPine (CLOZARIL) 25 MG tablet Take 3 tablets (75 mg total) by mouth at bedtime. 90 tablet 0   clozapine (CLOZARIL) 50 MG tablet Take 1 tablet (50 mg total) by mouth daily. 30 tablet 0   diltiazem (CARDIZEM CD) 240 MG 24 hr capsule Take 1 capsule (240 mg total) by mouth daily. (Patient not taking: Reported on 11/24/2021) 30 capsule 0    divalproex (DEPAKOTE ER) 500 MG 24 hr tablet Take 1 tablet (500 mg total) by mouth 2 (two) times daily. 60 tablet 0   glucose blood test strip Use as directed up to four times daily 50 each 0   haloperidol (HALDOL) 10 MG tablet Take 1 tablet (10 mg total) by mouth 3 (three) times daily as needed for agitation (and psychotic symptoms).     INGREZZA 40 MG capsule Take 1 capsule (40 mg total) by mouth in the morning. 30 capsule 0   insulin aspart (NOVOLOG) 100 UNIT/ML FlexPen Before each meal 3 times a day, 140-199 - 2 units, 200-250 - 4 units, 251-299 - 6 units,  300-349 - 8 units,  350 or above 10 units. Insulin PEN if approved, provide syringes and needles if needed.Please switch to any approved short acting Insulin if needed. 15 mL 0   insulin glargine (LANTUS) 100 UNIT/ML Solostar Pen Inject 12 Units into the skin daily. 15 mL 0   Insulin Pen Needle 32G X 4 MM MISC Use 4 times a day with insulin, 1 month supply. 100 each 0   latanoprost (XALATAN) 0.005 % ophthalmic solution Place 1 drop into both eyes at bedtime.     metFORMIN (GLUCOPHAGE) 500 MG tablet Take 500 mg by mouth 2 (two) times daily with a meal.     nicotine (NICODERM CQ - DOSED IN MG/24 HR) 7 mg/24hr patch Place 1 patch (7 mg total) onto the skin daily. 28 patch 0   PROAIR HFA 108 (90 Base) MCG/ACT inhaler Inhale 2 puffs into the lungs every 6 (six) hours as needed for wheezing or shortness of breath.      Labs  Lab Results:  Admission on 11/23/2021  Component Date Value Ref Range Status   SARS Coronavirus 2 by RT PCR 11/23/2021 NEGATIVE  NEGATIVE Final   Comment: (NOTE) SARS-CoV-2 target nucleic acids are NOT DETECTED.  The SARS-CoV-2 RNA is generally detectable in upper respiratory specimens during the acute phase of infection.  The lowest concentration of SARS-CoV-2 viral copies this assay can detect is 138 copies/mL. A negative result does not preclude SARS-Cov-2 infection and should not be used as the sole basis for  treatment or other patient management decisions. A negative result may occur with  improper specimen collection/handling, submission of specimen other than nasopharyngeal swab, presence of viral mutation(s) within the areas targeted by this assay, and inadequate number of viral copies(<138 copies/mL). A negative result must be combined with clinical observations, patient history, and epidemiological information. The expected result is Negative.  Fact Sheet for Patients:  EntrepreneurPulse.com.au  Fact Sheet for Healthcare Providers:  IncredibleEmployment.be  This test is no                          t yet approved or cleared by the [place] FDA and  has been authorized for detection and/or diagnosis of SARS-CoV-2 by FDA under an Emergency Use Authorization (EUA). This EUA will remain  in effect (meaning this test can be used) for the duration of the COVID-19 declaration under Section 564(b)(1) of the Act, 21 U.S.C.section 360bbb-3(b)(1), unless the authorization is terminated  or revoked sooner.       Influenza A by PCR 11/23/2021 NEGATIVE  NEGATIVE Final   Influenza B by PCR 11/23/2021 NEGATIVE  NEGATIVE Final   Comment: (NOTE) The Xpert Xpress SARS-CoV-2/FLU/RSV plus assay is intended as an aid in the diagnosis of influenza from Nasopharyngeal swab specimens and should not be used as a sole basis for treatment. Nasal washings and aspirates are unacceptable for Xpert Xpress SARS-CoV-2/FLU/RSV testing.  Fact Sheet for Patients: EntrepreneurPulse.com.au  Fact Sheet for Healthcare Providers: IncredibleEmployment.be  This test is not yet approved or cleared by the [place] FDA and has been authorized for detection and/or diagnosis of SARS-CoV-2 by FDA under an Emergency Use Authorization (EUA). This EUA will remain in effect (meaning this test can be used) for the duration of the COVID-19  declaration under Section 564(b)(1) of the Act, 21 U.S.C. section 360bbb-3(b)(1), unless the authorization is terminated or revoked.  Performed at [facility], [place] [address] [place], [place] [address]    WBC 11/23/2021 7.4  4.0 - 10.5 K/uL Final   RBC 11/23/2021 4.25  3.87 - 5.11 MIL/uL Final   Hemoglobin 11/23/2021 11.1 (L)  12.0 - 15.0 g/dL Final   HCT 11/23/2021 34.6 (L)  36.0 - 46.0 % Final   MCV 11/23/2021 81.4  80.0 - 100.0 fL Final   MCH 11/23/2021 26.1  26.0 - 34.0 pg Final   MCHC 11/23/2021 32.1  30.0 - 36.0 g/dL Final   RDW 11/23/2021 17.7 (H)  11.5 - 15.5 % Final   Platelets 11/23/2021 365  150 - 400 K/uL Final   nRBC 11/23/2021 0.0  0.0 - 0.2 % Final   Neutrophils Relative % 11/23/2021 59  % Final   Neutro Abs 11/23/2021 4.3  1.7 - 7.7 K/uL Final   Lymphocytes Relative 11/23/2021 31  % Final   Lymphs Abs 11/23/2021 2.3  0.7 - 4.0 K/uL Final   Monocytes Relative 11/23/2021 7  % Final   Monocytes Absolute 11/23/2021 0.5  0.1 - 1.0 K/uL Final   Eosinophils Relative 11/23/2021 2  % Final   Eosinophils Absolute 11/23/2021 0.1  0.0 - 0.5 K/uL Final   Basophils Relative 11/23/2021 1  % Final   Basophils Absolute 11/23/2021 0.1  0.0 - 0.1 K/uL Final   Immature Granulocytes 11/23/2021 0  %  Final   Abs Immature Granulocytes 11/23/2021 0.01  0.00 - 0.07 K/uL Final   Performed at [facility] [address] [place], [place] [address]   Sodium 11/23/2021 139  135 - 145 mmol/L Final   Potassium 11/23/2021 4.1  3.5 - 5.1 mmol/L Final   Chloride 11/23/2021 105  98 - 111 mmol/L Final   CO2 11/23/2021 26  22 - 32 mmol/L Final   Glucose, Bld 11/23/2021 101 (H)  70 - 99 mg/dL Final   Glucose reference range applies only to samples taken after fasting for at least 8 hours.   BUN 11/23/2021 5 (L)  6 - 20 mg/dL Final   Creatinine, Ser 11/23/2021 0.51  0.44 - 1.00 mg/dL Final   Calcium 11/23/2021 9.0  8.9 - 10.3 mg/dL Final   Total Protein 11/23/2021 6.7  6.5 - 8.1 g/dL  Final   Albumin 11/23/2021 3.6  3.5 - 5.0 g/dL Final   AST 11/23/2021 24  15 - 41 U/L Final   ALT 11/23/2021 18  0 - 44 U/L Final   Alkaline Phosphatase 11/23/2021 62  38 - 126 U/L Final   Total Bilirubin 11/23/2021 0.4  0.3 - 1.2 mg/dL Final   GFR, Estimated 11/23/2021 >60  >60 mL/min Final   Comment: (NOTE) Calculated using the CKD-EPI Creatinine Equation (2021)    Anion gap 11/23/2021 8  5 - 15 Final   Performed at [facility] [address] [place], [place] [address]   Magnesium 11/23/2021 2.0  1.7 - 2.4 mg/dL Final   Performed at [facility] [address] [place], [place] [address]   Alcohol, Ethyl (B) 11/23/2021 <10  <10 mg/dL Final   Comment: (NOTE) Lowest detectable limit for serum alcohol is 10 mg/dL.  For medical purposes only. Performed at [facility], [place] [address] [place], [place] [address]    Prolactin 11/23/2021 3.8 (L)  4.8 - 23.3 ng/mL Final   Comment: (NOTE) Performed At: [facility] [place], [place] [id number] Rush Farmer MD RP:[id number]    Color, Urine 11/23/2021 YELLOW  YELLOW Final   APPearance 11/23/2021 CLEAR  CLEAR Final   Specific Gravity, Urine 11/23/2021 1.025  1.005 - 1.030 Final   pH 11/23/2021 6.5  5.0 - 8.0 Final   Glucose, UA 11/23/2021 NEGATIVE  NEGATIVE mg/dL Final   Hgb urine dipstick 11/23/2021 NEGATIVE  NEGATIVE Final   Bilirubin Urine 11/23/2021 NEGATIVE  NEGATIVE Final   Ketones, ur 11/23/2021 NEGATIVE  NEGATIVE mg/dL Final   Protein, ur 11/23/2021 NEGATIVE  NEGATIVE mg/dL Final   Nitrite 11/23/2021 NEGATIVE  NEGATIVE Final   Leukocytes,Ua 11/23/2021 NEGATIVE  NEGATIVE Final   Microscopic not done on urines with negative protein, blood, leukocytes, nitrite, or glucose < 500 mg/dL.   RBC / HPF 11/23/2021 0-5  0 - 5 RBC/hpf Final   WBC, UA 11/23/2021 0-5  0 - 5 WBC/hpf Final   Bacteria, UA 11/23/2021 NONE SEEN  NONE SEEN Final   Squamous Epithelial / LPF 11/23/2021 0-5  0 - 5 Final    Mucus 11/23/2021 PRESENT   Final   Performed at [facility], [place] [address] [place], [place] [address]   POC Amphetamine UR 11/23/2021 None Detected  NONE DETECTED (Cut Off Level 1000 ng/mL) Final   POC Secobarbital (BAR) 11/23/2021 None Detected  NONE DETECTED (Cut Off Level 300 ng/mL) Final   POC Buprenorphine (BUP) 11/23/2021 None Detected  NONE DETECTED (Cut Off Level  10 ng/mL) Final   POC Oxazepam (BZO) 11/23/2021 None Detected  NONE DETECTED (Cut Off Level 300 ng/mL) Final   POC Cocaine UR 11/23/2021 None Detected  NONE DETECTED (Cut Off Level 300 ng/mL) Final   POC Methamphetamine UR 11/23/2021 None Detected  NONE DETECTED (Cut Off Level 1000 ng/mL) Final   POC Morphine 11/23/2021 None Detected  NONE DETECTED (Cut Off Level 300 ng/mL) Final   POC Methadone UR 11/23/2021 None Detected  NONE DETECTED (Cut Off Level 300 ng/mL) Final   POC Oxycodone UR 11/23/2021 None Detected  NONE DETECTED (Cut Off Level 100 ng/mL) Final   POC Marijuana UR 11/23/2021 None Detected  NONE DETECTED (Cut Off Level 50 ng/mL) Final   SARSCOV2ONAVIRUS 2 AG 11/23/2021 NEGATIVE  NEGATIVE Final   Comment: (NOTE) SARS-CoV-2 antigen NOT DETECTED.   Negative results are presumptive.  Negative results do not preclude SARS-CoV-2 infection and should not be used as the sole basis for treatment or other patient management decisions, including infection  control decisions, particularly in the presence of clinical signs and  symptoms consistent with COVID-19, or in those who have been in contact with the virus.  Negative results must be combined with clinical observations, patient history, and epidemiological information. The expected result is Negative.  Fact Sheet for Patients: HandmadeRecipes.com.cy  Fact Sheet for Healthcare Providers: FuneralLife.at  This test is not yet approved or cleared by the [place] FDA and  has been authorized for detection  and/or diagnosis of SARS-CoV-2 by FDA under an Emergency Use Authorization (EUA).  This EUA will remain in effect (meaning this test can be used) for the duration of  the COV                          ID-19 declaration under Section 564(b)(1) of the Act, 21 U.S.C. section 360bbb-3(b)(1), unless the authorization is terminated or revoked sooner.     Cholesterol 11/23/2021 171  0 - 200 mg/dL Final   Triglycerides 11/23/2021 125  <150 mg/dL Final   HDL 11/23/2021 65  >40 mg/dL Final   Total CHOL/HDL Ratio 11/23/2021 2.6  RATIO Final   VLDL 11/23/2021 25  0 - 40 mg/dL Final   LDL Cholesterol 11/23/2021 81  0 - 99 mg/dL Final   Comment:        Total Cholesterol/HDL:CHD Risk Coronary Heart Disease Risk Table                     Men   Women  1/2 Average Risk   3.4   3.3  Average Risk       5.0   4.4  2 X Average Risk   9.6   7.1  3 X Average Risk  23.4   11.0        Use the calculated Patient Ratio above and the CHD Risk Table to determine the patient's CHD Risk.        ATP III CLASSIFICATION (LDL):  <100     mg/dL   Optimal  100-129  mg/dL   Near or Above                    Optimal  130-159  mg/dL   Borderline  160-189  mg/dL   High  >190     mg/dL   Very High Performed at [facility] [address] [place], [place] [address]    TSH 11/23/2021 1.788  0.350 -  4.500 uIU/mL Final   Comment: Performed by a 3rd Generation assay with a functional sensitivity of <=0.01 uIU/mL. Performed at [facility], [place] [address] [place], [place] [address]    Glucose-Capillary 11/23/2021 137 (H)  70 - 99 mg/dL Final   Glucose reference range applies only to samples taken after fasting for at least 8 hours.   Glucose-Capillary 11/24/2021 144 (H)  70 - 99 mg/dL Final   Glucose reference range applies only to samples taken after fasting for at least 8 hours.   Glucose-Capillary 11/24/2021 87  70 - 99 mg/dL Final   Glucose reference range applies only to samples taken after fasting  for at least 8 hours.   Glucose-Capillary 11/24/2021 269 (H)  70 - 99 mg/dL Final   Glucose reference range applies only to samples taken after fasting for at least 8 hours.   Glucose-Capillary 11/25/2021 112 (H)  70 - 99 mg/dL Final   Glucose reference range applies only to samples taken after fasting for at least 8 hours.   Glucose-Capillary 11/25/2021 136 (H)  70 - 99 mg/dL Final   Glucose reference range applies only to samples taken after fasting for at least 8 hours.   Glucose-Capillary 11/25/2021 187 (H)  70 - 99 mg/dL Final   Glucose reference range applies only to samples taken after fasting for at least 8 hours.   Glucose-Capillary 11/25/2021 159 (H)  70 - 99 mg/dL Final   Glucose reference range applies only to samples taken after fasting for at least 8 hours.   Glucose-Capillary 11/26/2021 122 (H)  70 - 99 mg/dL Final   Glucose reference range applies only to samples taken after fasting for at least 8 hours.   Glucose-Capillary 11/26/2021 140 (H)  70 - 99 mg/dL Final   Glucose reference range applies only to samples taken after fasting for at least 8 hours.   Glucose-Capillary 11/26/2021 102 (H)  70 - 99 mg/dL Final   Glucose reference range applies only to samples taken after fasting for at least 8 hours.   Glucose-Capillary 11/26/2021 166 (H)  70 - 99 mg/dL Final   Glucose reference range applies only to samples taken after fasting for at least 8 hours.   Glucose-Capillary 11/27/2021 133 (H)  70 - 99 mg/dL Final   Glucose reference range applies only to samples taken after fasting for at least 8 hours.   Glucose-Capillary 11/27/2021 146 (H)  70 - 99 mg/dL Final   Glucose reference range applies only to samples taken after fasting for at least 8 hours.   Glucose-Capillary 11/27/2021 144 (H)  70 - 99 mg/dL Final   Glucose reference range applies only to samples taken after fasting for at least 8 hours.   Glucose-Capillary 11/27/2021 154 (H)  70 - 99 mg/dL Final   Glucose  reference range applies only to samples taken after fasting for at least 8 hours.   Glucose-Capillary 11/28/2021 107 (H)  70 - 99 mg/dL Final   Glucose reference range applies only to samples taken after fasting for at least 8 hours.   Glucose-Capillary 11/28/2021 166 (H)  70 - 99 mg/dL Final   Glucose reference range applies only to samples taken after fasting for at least 8 hours.   Glucose-Capillary 11/28/2021 89  70 - 99 mg/dL Final   Glucose reference range applies only to samples taken after fasting for at least 8 hours.   Comment 1 11/28/2021 NURRP   Final   Glucose-Capillary 11/28/2021 243 (H)  70 - 99 mg/dL Final   Glucose reference range applies only to samples taken after fasting for at least 8 hours.   Glucose-Capillary 11/29/2021 193 (H)  70 - 99 mg/dL Final   Glucose reference range applies only to samples taken after fasting for at least 8 hours.   Glucose-Capillary 11/29/2021 164 (H)  70 - 99 mg/dL Final   Glucose reference range applies only to samples taken after fasting for at least 8 hours.   Glucose-Capillary 11/29/2021 192 (H)  70 - 99 mg/dL Final   Glucose reference range applies only to samples taken after fasting for at least 8 hours.   WBC 11/30/2021 5.8  4.0 - 10.5 K/uL Final   RBC 11/30/2021 4.40  3.87 - 5.11 MIL/uL Final   Hemoglobin 11/30/2021 11.3 (L)  12.0 - 15.0 g/dL Final   HCT 11/30/2021 35.2 (L)  36.0 - 46.0 % Final   MCV 11/30/2021 80.0  80.0 - 100.0 fL Final   MCH 11/30/2021 25.7 (L)  26.0 - 34.0 pg Final   MCHC 11/30/2021 32.1  30.0 - 36.0 g/dL Final   RDW 11/30/2021 17.2 (H)  11.5 - 15.5 % Final   Platelets 11/30/2021 314  150 - 400 K/uL Final   nRBC 11/30/2021 0.0  0.0 - 0.2 % Final   Neutrophils Relative % 11/30/2021 52  % Final   Neutro Abs 11/30/2021 2.9  1.7 - 7.7 K/uL Final   Lymphocytes Relative 11/30/2021 38  % Final   Lymphs Abs 11/30/2021 2.2  0.7 - 4.0 K/uL Final   Monocytes Relative 11/30/2021 7  % Final   Monocytes Absolute  11/30/2021 0.4  0.1 - 1.0 K/uL Final   Eosinophils Relative 11/30/2021 2  % Final   Eosinophils Absolute 11/30/2021 0.1  0.0 - 0.5 K/uL Final   Basophils Relative 11/30/2021 1  % Final   Basophils Absolute 11/30/2021 0.1  0.0 - 0.1 K/uL Final   Immature Granulocytes 11/30/2021 0  % Final   Abs Immature Granulocytes 11/30/2021 0.01  0.00 - 0.07 K/uL Final   Performed at [facility], [place] [address] [place], [place] [address]   Glucose-Capillary 11/29/2021 150 (H)  70 - 99 mg/dL Final   Glucose reference range applies only to samples taken after fasting for at least 8 hours.   Glucose-Capillary 11/30/2021 120 (H)  70 - 99 mg/dL Final   Glucose reference range applies only to samples taken after fasting for at least 8 hours.   Glucose-Capillary 11/30/2021 86  70 - 99 mg/dL Final   Glucose reference range applies only to samples taken after fasting for at least 8 hours.   Glucose-Capillary 11/30/2021 123 (H)  70 - 99 mg/dL Final   Glucose reference range applies only to samples taken after fasting for at least 8 hours.   Glucose-Capillary 11/30/2021 161 (H)  70 - 99 mg/dL Final   Glucose reference range applies only to samples taken after fasting for at least 8 hours.   Glucose-Capillary 12/01/2021 123 (H)  70 - 99 mg/dL Final   Glucose reference range applies only to samples taken after fasting for at least 8 hours.   Glucose-Capillary 12/01/2021 193 (H)  70 - 99 mg/dL Final   Glucose reference range applies only to samples taken after fasting for at least 8 hours.   Glucose-Capillary 12/01/2021 161 (H)  70 - 99 mg/dL Final   Glucose reference range applies only to samples taken after fasting for at least 8 hours.  Glucose-Capillary 12/01/2021 135 (H)  70 - 99 mg/dL Final   Glucose reference range applies only to samples taken after fasting for at least 8 hours.   Glucose-Capillary 12/02/2021 198 (H)  70 - 99 mg/dL Final   Glucose reference range applies only to samples taken after  fasting for at least 8 hours.   Glucose-Capillary 12/02/2021 79  70 - 99 mg/dL Final   Glucose reference range applies only to samples taken after fasting for at least 8 hours.  Admission on 11/22/2021, Discharged on 11/22/2021  Component Date Value Ref Range Status   Glucose-Capillary 11/22/2021 169 (H)  70 - 99 mg/dL Final   Glucose reference range applies only to samples taken after fasting for at least 8 hours.  No results displayed because visit has over 200 results.    Admission on 10/04/2021, Discharged on 10/22/2021  Component Date Value Ref Range Status   SARS Coronavirus 2 by RT PCR 10/04/2021 NEGATIVE  NEGATIVE Final   Comment: (NOTE) SARS-CoV-2 target nucleic acids are NOT DETECTED.  The SARS-CoV-2 RNA is generally detectable in upper respiratory specimens during the acute phase of infection. The lowest concentration of SARS-CoV-2 viral copies this assay can detect is 138 copies/mL. A negative result does not preclude SARS-Cov-2 infection and should not be used as the sole basis for treatment or other patient management decisions. A negative result may occur with  improper specimen collection/handling, submission of specimen other than nasopharyngeal swab, presence of viral mutation(s) within the areas targeted by this assay, and inadequate number of viral copies(<138 copies/mL). A negative result must be combined with clinical observations, patient history, and epidemiological information. The expected result is Negative.  Fact Sheet for Patients:  EntrepreneurPulse.com.au  Fact Sheet for Healthcare Providers:  IncredibleEmployment.be  This test is no                          t yet approved or cleared by the [place] FDA and  has been authorized for detection and/or diagnosis of SARS-CoV-2 by FDA under an Emergency Use Authorization (EUA). This EUA will remain  in effect (meaning this test can be used) for the duration of  the COVID-19 declaration under Section 564(b)(1) of the Act, 21 U.S.C.section 360bbb-3(b)(1), unless the authorization is terminated  or revoked sooner.       Influenza A by PCR 10/04/2021 NEGATIVE  NEGATIVE Final   Influenza B by PCR 10/04/2021 NEGATIVE  NEGATIVE Final   Comment: (NOTE) The Xpert Xpress SARS-CoV-2/FLU/RSV plus assay is intended as an aid in the diagnosis of influenza from Nasopharyngeal swab specimens and should not be used as a sole basis for treatment. Nasal washings and aspirates are unacceptable for Xpert Xpress SARS-CoV-2/FLU/RSV testing.  Fact Sheet for Patients: EntrepreneurPulse.com.au  Fact Sheet for Healthcare Providers: IncredibleEmployment.be  This test is not yet approved or cleared by the [place] FDA and has been authorized for detection and/or diagnosis of SARS-CoV-2 by FDA under an Emergency Use Authorization (EUA). This EUA will remain in effect (meaning this test can be used) for the duration of the COVID-19 declaration under Section 564(b)(1) of the Act, 21 U.S.C. section 360bbb-3(b)(1), unless the authorization is terminated or revoked.  Performed at [facility], [place] [address] [place], [place] [address]    WBC 10/04/2021 8.4  4.0 - 10.5 K/uL Final   RBC 10/04/2021 4.42  3.87 - 5.11 MIL/uL Final   Hemoglobin 10/04/2021 11.6 (L)  12.0 - 15.0  g/dL Final   HCT 10/04/2021 35.7 (L)  36.0 - 46.0 % Final   MCV 10/04/2021 80.8  80.0 - 100.0 fL Final   MCH 10/04/2021 26.2  26.0 - 34.0 pg Final   MCHC 10/04/2021 32.5  30.0 - 36.0 g/dL Final   RDW 10/04/2021 16.0 (H)  11.5 - 15.5 % Final   Platelets 10/04/2021 372  150 - 400 K/uL Final   nRBC 10/04/2021 0.0  0.0 - 0.2 % Final   Neutrophils Relative % 10/04/2021 68  % Final   Neutro Abs 10/04/2021 5.7  1.7 - 7.7 K/uL Final   Lymphocytes Relative 10/04/2021 22  % Final   Lymphs Abs 10/04/2021 1.8  0.7 - 4.0 K/uL Final   Monocytes Relative  10/04/2021 8  % Final   Monocytes Absolute 10/04/2021 0.7  0.1 - 1.0 K/uL Final   Eosinophils Relative 10/04/2021 1  % Final   Eosinophils Absolute 10/04/2021 0.1  0.0 - 0.5 K/uL Final   Basophils Relative 10/04/2021 1  % Final   Basophils Absolute 10/04/2021 0.1  0.0 - 0.1 K/uL Final   Immature Granulocytes 10/04/2021 0  % Final   Abs Immature Granulocytes 10/04/2021 0.03  0.00 - 0.07 K/uL Final   Performed at [facility], [place] [address], [place], [place] [address]   Sodium 10/04/2021 136  135 - 145 mmol/L Final   Potassium 10/04/2021 4.2  3.5 - 5.1 mmol/L Final   Chloride 10/04/2021 104  98 - 111 mmol/L Final   CO2 10/04/2021 25  22 - 32 mmol/L Final   Glucose, Bld 10/04/2021 100 (H)  70 - 99 mg/dL Final   Glucose reference range applies only to samples taken after fasting for at least 8 hours.   BUN 10/04/2021 9  6 - 20 mg/dL Final   Creatinine, Ser 10/04/2021 0.52  0.44 - 1.00 mg/dL Final   Calcium 10/04/2021 9.0  8.9 - 10.3 mg/dL Final   Total Protein 10/04/2021 7.0  6.5 - 8.1 g/dL Final   Albumin 10/04/2021 3.2 (L)  3.5 - 5.0 g/dL Final   AST 10/04/2021 13 (L)  15 - 41 U/L Final   ALT 10/04/2021 10  0 - 44 U/L Final   Alkaline Phosphatase 10/04/2021 61  38 - 126 U/L Final   Total Bilirubin 10/04/2021 0.3  0.3 - 1.2 mg/dL Final   GFR, Estimated 10/04/2021 >60  >60 mL/min Final   Comment: (NOTE) Calculated using the CKD-EPI Creatinine Equation (2021)    Anion gap 10/04/2021 7  5 - 15 Final   Performed at [facility] [address], [place], [place] [address]   Hgb A1c MFr Bld 10/04/2021 7.0 (H)  4.8 - 5.6 % Final   Comment: (NOTE) Pre diabetes:          5.7%-6.4%  Diabetes:              >6.4%  Glycemic control for   <7.0% adults with diabetes    Mean Plasma Glucose 10/04/2021 154.2  mg/dL Final   Performed at [facility], [place] [address], [place], [place] [address]   Cholesterol 10/04/2021 178  0 - 200 mg/dL Final   Triglycerides 10/04/2021 155 (H)   <150 mg/dL Final   HDL 10/04/2021 52  >40 mg/dL Final   Total CHOL/HDL Ratio 10/04/2021 3.4  RATIO Final   VLDL 10/04/2021 31  0 - 40 mg/dL Final   LDL Cholesterol 10/04/2021 95  0 - 99 mg/dL Final  Comment:        Total Cholesterol/HDL:CHD Risk Coronary Heart Disease Risk Table                     Men   Women  1/2 Average Risk   3.4   3.3  Average Risk       5.0   4.4  2 X Average Risk   9.6   7.1  3 X Average Risk  23.4   11.0        Use the calculated Patient Ratio above and the CHD Risk Table to determine the patient's CHD Risk.        ATP III CLASSIFICATION (LDL):  <100     mg/dL   Optimal  100-129  mg/dL   Near or Above                    Optimal  130-159  mg/dL   Borderline  160-189  mg/dL   High  >190     mg/dL   Very High Performed at [facility] [address] [place], [place] [address]    POC Amphetamine UR 10/04/2021 None Detected  NONE DETECTED (Cut Off Level 1000 ng/mL) Final   POC Secobarbital (BAR) 10/04/2021 None Detected  NONE DETECTED (Cut Off Level 300 ng/mL) Final   POC Buprenorphine (BUP) 10/04/2021 None Detected  NONE DETECTED (Cut Off Level 10 ng/mL) Final   POC Oxazepam (BZO) 10/04/2021 None Detected  NONE DETECTED (Cut Off Level 300 ng/mL) Final   POC Cocaine UR 10/04/2021 None Detected  NONE DETECTED (Cut Off Level 300 ng/mL) Final   POC Methamphetamine UR 10/04/2021 None Detected  NONE DETECTED (Cut Off Level 1000 ng/mL) Final   POC Morphine 10/04/2021 None Detected  NONE DETECTED (Cut Off Level 300 ng/mL) Final   POC Methadone UR 10/04/2021 None Detected  NONE DETECTED (Cut Off Level 300 ng/mL) Final   POC Oxycodone UR 10/04/2021 Positive (A)  NONE DETECTED (Cut Off Level 100 ng/mL) Final   POC Marijuana UR 10/04/2021 None Detected  NONE DETECTED (Cut Off Level 50 ng/mL) Final   SARSCOV2ONAVIRUS 2 AG 10/04/2021 NEGATIVE  NEGATIVE Final   Comment: (NOTE) SARS-CoV-2 antigen NOT DETECTED.   Negative results are presumptive.  Negative  results do not preclude SARS-CoV-2 infection and should not be used as the sole basis for treatment or other patient management decisions, including infection  control decisions, particularly in the presence of clinical signs and  symptoms consistent with COVID-19, or in those who have been in contact with the virus.  Negative results must be combined with clinical observations, patient history, and epidemiological information. The expected result is Negative.  Fact Sheet for Patients: HandmadeRecipes.com.cy  Fact Sheet for Healthcare Providers: FuneralLife.at  This test is not yet approved or cleared by the [place] FDA and  has been authorized for detection and/or diagnosis of SARS-CoV-2 by FDA under an Emergency Use Authorization (EUA).  This EUA will remain in effect (meaning this test can be used) for the duration of  the COV                          ID-19 declaration under Section 564(b)(1) of the Act, 21 U.S.C. section 360bbb-3(b)(1), unless the authorization is terminated or revoked sooner.     Valproic Acid Lvl 10/04/2021 51  50.0 - 100.0 ug/mL Final   Performed at [facility]  [address] [place], [place] [address]   Valproic Acid Lvl 10/08/2021 57  50.0 - 100.0 ug/mL Final   Performed at [facility] [address], [place], [place] [address]   Glucose-Capillary 10/09/2021 123 (H)  70 - 99 mg/dL Final   Glucose reference range applies only to samples taken after fasting for at least 8 hours.  Admission on 09/09/2021, Discharged on 09/10/2021  Component Date Value Ref Range Status   Sodium 09/09/2021 134 (L)  135 - 145 mmol/L Final   Potassium 09/09/2021 4.3  3.5 - 5.1 mmol/L Final   Chloride 09/09/2021 99  98 - 111 mmol/L Final   CO2 09/09/2021 25  22 - 32 mmol/L Final   Glucose, Bld 09/09/2021 107 (H)  70 - 99 mg/dL Final   Glucose reference range applies only to samples taken after fasting for at  least 8 hours.   BUN 09/09/2021 12  6 - 20 mg/dL Final   Creatinine, Ser 09/09/2021 0.74  0.44 - 1.00 mg/dL Final   Calcium 09/09/2021 9.0  8.9 - 10.3 mg/dL Final   Total Protein 09/09/2021 7.4  6.5 - 8.1 g/dL Final   Albumin 09/09/2021 3.1 (L)  3.5 - 5.0 g/dL Final   AST 09/09/2021 13 (L)  15 - 41 U/L Final   ALT 09/09/2021 13  0 - 44 U/L Final   Alkaline Phosphatase 09/09/2021 66  38 - 126 U/L Final   Total Bilirubin 09/09/2021 0.2 (L)  0.3 - 1.2 mg/dL Final   GFR, Estimated 09/09/2021 >60  >60 mL/min Final   Comment: (NOTE) Calculated using the CKD-EPI Creatinine Equation (2021)    Anion gap 09/09/2021 10  5 - 15 Final   Performed at [facility], [place] [address] [place], [place] [address]   WBC 09/09/2021 8.5  4.0 - 10.5 K/uL Final   RBC 09/09/2021 4.53  3.87 - 5.11 MIL/uL Final   Hemoglobin 09/09/2021 11.8 (L)  12.0 - 15.0 g/dL Final   HCT 09/09/2021 38.0  36.0 - 46.0 % Final   MCV 09/09/2021 83.9  80.0 - 100.0 fL Final   MCH 09/09/2021 26.0  26.0 - 34.0 pg Final   MCHC 09/09/2021 31.1  30.0 - 36.0 g/dL Final   RDW 09/09/2021 17.8 (H)  11.5 - 15.5 % Final   Platelets 09/09/2021 442 (H)  150 - 400 K/uL Final   nRBC 09/09/2021 0.0  0.0 - 0.2 % Final   Neutrophils Relative % 09/09/2021 70  % Final   Neutro Abs 09/09/2021 5.9  1.7 - 7.7 K/uL Final   Lymphocytes Relative 09/09/2021 20  % Final   Lymphs Abs 09/09/2021 1.7  0.7 - 4.0 K/uL Final   Monocytes Relative 09/09/2021 8  % Final   Monocytes Absolute 09/09/2021 0.7  0.1 - 1.0 K/uL Final   Eosinophils Relative 09/09/2021 1  % Final   Eosinophils Absolute 09/09/2021 0.1  0.0 - 0.5 K/uL Final   Basophils Relative 09/09/2021 1  % Final   Basophils Absolute 09/09/2021 0.1  0.0 - 0.1 K/uL Final   Immature Granulocytes 09/09/2021 0  % Final   Abs Immature Granulocytes 09/09/2021 0.03  0.00 - 0.07 K/uL Final   Performed at [facility], [place] [address], [place], [place] [address]   Ammonia 09/09/2021 37 (H)  9 - 35  umol/L Final   Comment: HEMOLYSIS AT THIS LEVEL MAY AFFECT RESULT Performed at [facility], [place] [address] [place], [place] [address]  Opiates 09/09/2021 NONE DETECTED  NONE DETECTED Final   Cocaine 09/09/2021 NONE DETECTED  NONE DETECTED Final   Benzodiazepines 09/09/2021 NONE DETECTED  NONE DETECTED Final   Amphetamines 09/09/2021 NONE DETECTED  NONE DETECTED Final   Tetrahydrocannabinol 09/09/2021 NONE DETECTED  NONE DETECTED Final   Barbiturates 09/09/2021 NONE DETECTED  NONE DETECTED Final   Comment: (NOTE) DRUG SCREEN FOR MEDICAL PURPOSES ONLY.  IF CONFIRMATION IS NEEDED FOR ANY PURPOSE, NOTIFY LAB WITHIN 5 DAYS.  LOWEST DETECTABLE LIMITS FOR URINE DRUG SCREEN Drug Class                     Cutoff (ng/mL) Amphetamine and metabolites    1000 Barbiturate and metabolites    200 Benzodiazepine                 258 Tricyclics and metabolites     300 Opiates and metabolites        300 Cocaine and metabolites        300 THC                            50 Performed at [facility], [place] [address], [place], [place] [address]    Alcohol, Ethyl (B) 09/09/2021 <10  <10 mg/dL Final   Comment: (NOTE) Lowest detectable limit for serum alcohol is 10 mg/dL.  For medical purposes only. Performed at [facility], [place] [address] [place], [place] [address]    Lipase 09/09/2021 33  11 - 51 U/L Final   Performed at [facility] [address], [place], [place] [address]   Color, Urine 09/09/2021 COLORLESS (A)  YELLOW Final   APPearance 09/09/2021 CLEAR  CLEAR Final   Specific Gravity, Urine 09/09/2021 1.002 (L)  1.005 - 1.030 Final   pH 09/09/2021 6.0  5.0 - 8.0 Final   Glucose, UA 09/09/2021 NEGATIVE  NEGATIVE mg/dL Final   Hgb urine dipstick 09/09/2021 NEGATIVE  NEGATIVE Final   Bilirubin Urine 09/09/2021 NEGATIVE  NEGATIVE Final   Ketones, ur 09/09/2021 NEGATIVE  NEGATIVE mg/dL Final   Protein, ur 09/09/2021 NEGATIVE  NEGATIVE mg/dL Final   Nitrite  09/09/2021 NEGATIVE  NEGATIVE Final   Leukocytes,Ua 09/09/2021 NEGATIVE  NEGATIVE Final   Performed at [facility] [address] [place], [place] [address]   Specimen Description 09/09/2021 URINE, CLEAN CATCH   Final   Special Requests 09/09/2021 NONE   Final   Culture 09/09/2021  (A)   Final                   Value:<10,000 COLONIES/mL INSIGNIFICANT GROWTH Performed at [facility], [place] [address] [place], [place] [address]    Report Status 09/09/2021 09/10/2021 FINAL   Final   D-Dimer, Quant 09/09/2021 0.92 (H)  0.00 - 0.50 ug/mL-FEU Final   Comment: (NOTE) At the manufacturer cut-off value of 0.5 g/mL FEU, this assay has a negative predictive value of 95-100%.This assay is intended for use in conjunction with a clinical pretest probability (PTP) assessment model to exclude pulmonary embolism (PE) and deep venous thrombosis (DVT) in outpatients suspected of PE or DVT. Results should be correlated with clinical presentation. Performed at [facility], [place] [address] [place], [place] [address]    Troponin I (High Sensitivity) 09/09/2021 5  <18 ng/L Final   Comment: (NOTE) Elevated high sensitivity troponin I (hsTnI) values and significant  changes across  serial measurements may suggest ACS but many other  chronic and acute conditions are known to elevate hsTnI results.  Refer to the "Links" section for chest pain algorithms and additional  guidance. Performed at [facility], [place] [address] [place], [place] [address]    BP 09/10/2021 135/83  mmHg Final   S' Lateral 09/10/2021 2.30  cm Final   Area-P 1/2 09/10/2021 5.58  cm2 Final    Blood Alcohol level:  Lab Results  Component Value Date   ETH <10 11/23/2021   ETH <10 29/51/8841    Metabolic Disorder Labs: Lab Results  Component Value Date   HGBA1C 7.0 (H) 10/04/2021   MPG 154.2 10/04/2021   Lab Results  Component Value Date   PROLACTIN 3.8 (L) 11/23/2021   Lab Results  Component  Value Date   CHOL 171 11/23/2021   TRIG 125 11/23/2021   HDL 65 11/23/2021   CHOLHDL 2.6 11/23/2021   VLDL 25 11/23/2021   LDLCALC 81 11/23/2021   LDLCALC 95 10/04/2021    Therapeutic Lab Levels: No results found for: "LITHIUM" Lab Results  Component Value Date   VALPROATE 45 (L) 11/09/2021   VALPROATE 57 10/08/2021   No results found for: "CBMZ"  Physical Findings   PHQ2-9    [place] ED from 11/23/2021 in [facility]  PHQ-2 Total Score 2  PHQ-9 Total Score 3      Flowsheet Row ED from 11/23/2021 in [facility] Most recent reading at 11/23/2021  6:44 PM ED from 11/22/2021 in [facility] DEPT Most recent reading at 11/22/2021  8:28 PM ED from 11/22/2021 in [facility] Most recent reading at 11/22/2021  1:24 AM  C-SSRS RISK CATEGORY No Risk No Risk No Risk        Musculoskeletal  Strength & Muscle Tone: within normal limits Gait & Station: normal Patient leans: N/A  Psychiatric Specialty Exam  Presentation  General Appearance:  Appropriate for Environment  Eye Contact: Good  Speech: Clear and Coherent; Normal Rate  Speech Volume: Normal  Handedness: Right   Mood and Affect  Mood: Euthymic  Affect: Appropriate; Congruent   Thought Process  Thought Processes: Coherent; Goal Directed  Descriptions of Associations:Intact  Orientation:Full (Time, Place and Person)  Thought Content:Logical  Diagnosis of Schizophrenia or Schizoaffective disorder in past: Yes  Duration of Psychotic Symptoms: No data recorded  Hallucinations:Hallucinations: None  Ideas of Reference:None  Suicidal Thoughts:Suicidal Thoughts: No  Homicidal Thoughts:Homicidal Thoughts: No   Sensorium  Memory: Immediate Good; Recent Good  Judgment: Intact  Insight: Present   Executive Functions  Concentration: Good  Attention  Span: Good  Recall: Good  Fund of Knowledge: Fair  Language: Good   Psychomotor Activity  Psychomotor Activity: Psychomotor Activity: Normal   Assets  Assets: Communication Skills; Desire for Improvement; Financial Resources/Insurance; Resilience   Sleep  Sleep: Sleep: Good   Nutritional Assessment (For OBS and FBC admissions only) Has the patient had a weight loss or gain of 10 pounds or more in the last 3 months?: No Has the patient had a decrease in food intake/or appetite?: No Does the patient have dental problems?: No Does the patient have eating habits or behaviors that may be indicators of an eating disorder including binging or inducing vomiting?: No Has the patient recently lost weight without trying?: 0 Has the patient been eating poorly because of a decreased appetite?: 0 Malnutrition Screening Tool Score: 0  Physical Exam  Physical Exam Cardiovascular:     Rate and Rhythm: Normal rate.  Pulmonary:     Effort: Pulmonary effort is normal.  Neurological:     Mental Status: She is alert and oriented to person, place, and time.  Psychiatric:        Attention and Perception: Attention and perception normal. She does not perceive auditory or visual hallucinations.        Mood and Affect: Mood and affect normal.        Speech: Speech normal.        Behavior: Behavior normal. Behavior is cooperative.        Thought Content: Thought content normal. Thought content is not paranoid or delusional. Thought content does not include homicidal or suicidal ideation.        Judgment: Judgment is impulsive.    Review of Systems  Constitutional:  Negative for chills and fever.  Respiratory:  Negative for shortness of breath.   Cardiovascular:  Negative for chest pain and palpitations.  Gastrointestinal:  Negative for abdominal pain.  Neurological:  Negative for headaches.  Psychiatric/Behavioral: Negative.  Depression: Stable. Hallucinations: Denies. Substance  abuse: Denies. Suicidal ideas: Denies. Nervous/anxious: Stable. Insomnia: Denies.    Blood pressure 122/84, pulse 98, temperature 98.3 F (36.8 C), temperature source Oral, resp. rate 18, SpO2 100 %. There is no height or weight on file to calculate BMI.  Treatment Plan Summary:  Patient has been psychiatrically cleared and continues boarding in the continuous assessment unit at [facility] while waiting for DSS to find appropriate housing. Daily contact with patient to assess and evaluate symptoms and progress in treatment, Medication management, and Plan Social work/TOC to continue working with DSS to find appropriate housing.    Ezio Wieck, NP 12/02/2021 2:51 PM

## 2021-12-02 NOTE — ED Notes (Signed)
Pt sleeping at present, no distress noted, respirations even & unlabored.  Monitoring for safety. ?

## 2021-12-02 NOTE — ED Notes (Signed)
Pt A&O x 4, resting at present, watching TV at present, no distress noted.  Monitoring for safety.

## 2021-12-02 NOTE — ED Notes (Signed)
[name], reports no pain.  Denies SI, HI, and AVH.  Reports she had a large BM today, states she has not had one for several days.  Breathing is even and unlabored. Will continue to monitor for safety.

## 2021-12-02 NOTE — ED Notes (Signed)
Sleeping no distress  noted respirations are easy

## 2021-12-02 NOTE — ED Notes (Signed)
Sleeping easily awakened no distress noted

## 2021-12-03 LAB — GLUCOSE, CAPILLARY
Glucose-Capillary: 284 mg/dL — ABNORMAL HIGH (ref 70–99)
Glucose-Capillary: 220 mg/dL — ABNORMAL HIGH (ref 70–99)
Glucose-Capillary: 104 mg/dL — ABNORMAL HIGH (ref 70–99)
Glucose-Capillary: 81 mg/dL (ref 70–99)
Glucose-Capillary: 185 mg/dL — ABNORMAL HIGH (ref 70–99)

## 2021-12-03 LAB — VALPROIC ACID LEVEL: Valproic Acid Lvl: 54 ug/mL (ref 50.0–100.0)

## 2021-12-03 NOTE — ED Notes (Signed)
Pt sleeping at present, no distress noted, monitoring for safety. 

## 2021-12-03 NOTE — ED Notes (Signed)
Pt interacting with peers on unit. No acute distress noted. Safety maintained.

## 2021-12-03 NOTE — ED Notes (Signed)
Pt sleeping at present, no distress noted.  Monitoring for safety. 

## 2021-12-03 NOTE — ED Notes (Signed)
Pt sleeping in no acute distress. RR even and unlabored. Safety maintained. 

## 2021-12-03 NOTE — ED Notes (Signed)
Pt sitting up watching tv. No acute distress noted. Denies needs or concerns. Will continue to monitor for safety.

## 2021-12-03 NOTE — Care Management (Signed)
Care Management   Writer received a telephone update from Care Manager [phone]).  Per Alyse Low patient is under review for placement at [facility] in [place], [place].

## 2021-12-03 NOTE — ED Provider Notes (Signed)
FBC/OBS ASAP Discharge Summary  Date and Time: 12/03/2021 9:45 AM  Name: Teresa Murillo  MRN:  [id number]   Discharge Diagnoses:  Final diagnoses:  At risk for self care deficit  Noncompliance  Self-care deficit for medication administration  Schizophrenia, unspecified type ([place])    Subjective: Patient seen and reevaluated face-to-face by this provider, chart reviewed and case discussed with Dr. Dwyane Dee. On evaluation, patient is alert and oriented x 4. Her thought process is logical and speech is clear and coherent. Her mood is euthymic and affect is congruent. She is calm and cooperative. She denies SI/HI/AVH. There is no objective evidence that the patient is currently responding to internal or external stimuli. She denies depressive or anxiety symptoms. She reports fair sleep. She reports a good appetite. She is compliant with taking scheduled medications and denies medication side effects, no muscle spasms, involuntary movements, drowsiness or headaches. She has been observed on the unit without any aggressive, disruptive, self-harm, or psychotic behaviors. She denies concerns but asked when is she leaving. I discussed with her that I do not have an estimated timeframe as to when she will be discharged. However, the social work team is working with the Department of Social Services to establish guardianship and placement. She verbalizes understanding. She denies physical complaints.   Stay Summary: Per chart review, Teresa Murillo 61 year old female with a past psychiatric history of schizophrenia, aggressive behavior, and possible intellectual disability who presented to the [facility] on 11/23/21 voluntarily as a walk in via [facility] with complaints that she was locked out of the group home. Reported that owner of group home called police but doesn't know why. Group homeowner [name] at [phone] reports patient only living there temporarily while DSS look for another place to live.  Reported patient has already been discharged from facility. Reported unwilling to take back unless patient has a guardian related to patient leaving, staying gone for days and missing doses of medications. Reported that patient is unable to care for herself, needs reminders to shower and complete ADL, also needs assistance with medication management.      Patient unable to return to group home unless she has been assigned a guardian. Patient unable to care for herself and has been boarding on the continuous assessment unit while awaiting DSS caseworker to find appropriate placement. Social work/TOC working with DSS to find placement.      Total Time spent with patient: 20 minutes  Past Psychiatric History:history of schizophrenia, aggressive behavior, and possible intellectual disability presented Past Medical History:  Past Medical History:  Diagnosis Date   Chronic obstructive pulmonary disease (COPD) ([place])    Glaucoma    Hyperlipidemia    Hypertension    Iron deficiency    Schizoaffective disorder ([place])    Type 2 diabetes mellitus ([place])     Past Surgical History:  Procedure Laterality Date   TUBAL LIGATION     Family History:  Family History  Problem Relation Age of Onset   Breast cancer Maternal Grandmother    Family Psychiatric History: No history reported. Social History:  Social History   Substance and Sexual Activity  Alcohol Use Yes     Social History   Substance and Sexual Activity  Drug Use Not Currently    Social History   Socioeconomic History   Marital status: Divorced    Spouse name: Not on file   Number of children: Not on file   Years of education: Not on file   Highest  education level: Not on file  Occupational History   Not on file  Tobacco Use   Smoking status: Every Day    Packs/day: 1.00    Types: Cigars, Cigarettes   Smokeless tobacco: Current  Vaping Use   Vaping Use: Never used  Substance and Sexual Activity   Alcohol use: Yes   Drug  use: Not Currently   Sexual activity: Not Currently    Birth control/protection: Surgical  Other Topics Concern   Not on file  Social History Narrative   Not on file   Social Determinants of Health   Financial Resource Strain: Not on file  Food Insecurity: Not on file  Transportation Needs: Not on file  Physical Activity: Not on file  Stress: Not on file  Social Connections: Not on file   SDOH:  SDOH Screenings   Depression (PHQ2-9): Low Risk  (11/23/2021)  Tobacco Use: High Risk (12/02/2021)    Tobacco Cessation:  N/A, patient does not currently use tobacco products  Current Medications:  Current Facility-Administered Medications  Medication Dose Route Frequency Provider Last Rate Last Admin   acetaminophen (TYLENOL) tablet 650 mg  650 mg Oral Q6H PRN Rankin, Shuvon B, NP   650 mg at 11/28/21 2015   albuterol (VENTOLIN HFA) 108 (90 Base) MCG/ACT inhaler 2 puff  2 puff Inhalation Q6H PRN Rankin, Shuvon B, NP       alum & mag hydroxide-simeth (MAALOX/MYLANTA) 200-200-20 MG/5ML suspension 30 mL  30 mL Oral Q4H PRN Rankin, Shuvon B, NP       apixaban (ELIQUIS) tablet 5 mg  5 mg Oral BID Rankin, Shuvon B, NP   5 mg at 12/02/21 2107   cloZAPine (CLOZARIL) tablet 50 mg  50 mg Oral Daily [name], NP   50 mg at 12/02/21 0906   cloZAPine (CLOZARIL) tablet 75 mg  75 mg Oral QHS [name], NP   75 mg at 12/02/21 2107   diltiazem (CARDIZEM CD) 24 hr capsule 240 mg  240 mg Oral Daily Rankin, Shuvon B, NP   240 mg at 12/02/21 0907   divalproex (DEPAKOTE ER) 24 hr tablet 500 mg  500 mg Oral BID Rankin, Shuvon B, NP   500 mg at 12/02/21 2107   fluticasone furoate-vilanterol (BREO ELLIPTA) 200-25 MCG/ACT 1 puff  1 puff Inhalation Daily Rankin, Shuvon B, NP   1 puff at 12/03/21 2248   haloperidol (HALDOL) tablet 10 mg  10 mg Oral Q8H PRN Rankin, Shuvon B, NP       insulin aspart (novoLOG) injection 0-15 Units  0-15 Units Subcutaneous TID WC Rankin, Shuvon B, NP   5 Units at 12/03/21 0817    insulin glargine-yfgn (SEMGLEE) injection 12 Units  12 Units Subcutaneous Daily Rankin, Shuvon B, NP   12 Units at 12/02/21 0905   latanoprost (XALATAN) 0.005 % ophthalmic solution 1 drop  1 drop Both Eyes QHS Rankin, Shuvon B, NP   1 drop at 12/02/21 2122   magnesium hydroxide (MILK OF MAGNESIA) suspension 30 mL  30 mL Oral Daily PRN Rankin, Shuvon B, NP       metFORMIN (GLUCOPHAGE) tablet 500 mg  500 mg Oral BID WC Rankin, Shuvon B, NP   500 mg at 12/03/21 0817   mupirocin ointment (BACTROBAN) 2 % 1 Application  1 Application Topical TID Rankin, Shuvon B, NP   1 Application at 25/00/37 2108   nicotine (NICODERM CQ - dosed in mg/24 hr) patch 7 mg  7 mg Transdermal Daily Rankin, Shuvon  B, NP   7 mg at 12/02/21 0908   Vitamin D (Ergocalciferol) (DRISDOL) 1.25 MG (50000 UNIT) capsule 50,000 Units  50,000 Units Oral Q7 days Rankin, Shuvon B, NP       Current Outpatient Medications  Medication Sig Dispense Refill   Accu-Chek Softclix Lancets lancets Use as directed up to four times daily 100 each 0   apixaban (ELIQUIS) 5 MG TABS tablet Take 1 tablet (5 mg total) by mouth 2 (two) times daily. 60 tablet 0   ARIPiprazole (ABILIFY) 10 MG tablet Take 1 tablet (10 mg total) by mouth daily. 30 tablet 0   Blood Glucose Monitoring Suppl (ACCU-CHEK GUIDE) w/Device KIT Use as directed up to four times daily 1 kit 0   budesonide-formoterol (SYMBICORT) 160-4.5 MCG/ACT inhaler Inhale 2 puffs into the lungs in the morning and at bedtime.     Cholecalciferol (VITAMIN D3) 1.25 MG (50000 UT) CAPS Take 50,000 Units by mouth every Thursday.     cloZAPine (CLOZARIL) 25 MG tablet Take 3 tablets (75 mg total) by mouth at bedtime. 90 tablet 0   clozapine (CLOZARIL) 50 MG tablet Take 1 tablet (50 mg total) by mouth daily. 30 tablet 0   diltiazem (CARDIZEM CD) 240 MG 24 hr capsule Take 1 capsule (240 mg total) by mouth daily. (Patient not taking: Reported on 11/24/2021) 30 capsule 0   divalproex (DEPAKOTE ER) 500 MG 24 hr  tablet Take 1 tablet (500 mg total) by mouth 2 (two) times daily. 60 tablet 0   glucose blood test strip Use as directed up to four times daily 50 each 0   haloperidol (HALDOL) 10 MG tablet Take 1 tablet (10 mg total) by mouth 3 (three) times daily as needed for agitation (and psychotic symptoms).     INGREZZA 40 MG capsule Take 1 capsule (40 mg total) by mouth in the morning. 30 capsule 0   insulin aspart (NOVOLOG) 100 UNIT/ML FlexPen Before each meal 3 times a day, 140-199 - 2 units, 200-250 - 4 units, 251-299 - 6 units,  300-349 - 8 units,  350 or above 10 units. Insulin PEN if approved, provide syringes and needles if needed.Please switch to any approved short acting Insulin if needed. 15 mL 0   insulin glargine (LANTUS) 100 UNIT/ML Solostar Pen Inject 12 Units into the skin daily. 15 mL 0   Insulin Pen Needle 32G X 4 MM MISC Use 4 times a day with insulin, 1 month supply. 100 each 0   latanoprost (XALATAN) 0.005 % ophthalmic solution Place 1 drop into both eyes at bedtime.     metFORMIN (GLUCOPHAGE) 500 MG tablet Take 500 mg by mouth 2 (two) times daily with a meal.     nicotine (NICODERM CQ - DOSED IN MG/24 HR) 7 mg/24hr patch Place 1 patch (7 mg total) onto the skin daily. 28 patch 0   PROAIR HFA 108 (90 Base) MCG/ACT inhaler Inhale 2 puffs into the lungs every 6 (six) hours as needed for wheezing or shortness of breath.      PTA Medications: (Not in a hospital admission)      11/23/2021    6:44 PM  Depression screen PHQ 2/9  Decreased Interest 1  Down, Depressed, Hopeless 1  PHQ - 2 Score 2  Altered sleeping 0  Tired, decreased energy 1  Change in appetite 0  Feeling bad or failure about yourself  0  Trouble concentrating 0  Moving slowly or fidgety/restless 0  Suicidal thoughts 0  PHQ-9  Score 3  Difficult doing work/chores Somewhat difficult    Flowsheet Row ED from 11/23/2021 in [facility] Most recent reading at 11/23/2021  6:44 PM ED from  11/22/2021 in [place] DEPT Most recent reading at 11/22/2021  8:28 PM ED from 11/22/2021 in [place] DEPT Most recent reading at 11/22/2021  1:24 AM  C-SSRS RISK CATEGORY No Risk No Risk No Risk       Musculoskeletal  Strength & Muscle Tone: within normal limits Gait & Station: normal Patient leans: N/A  Psychiatric Specialty Exam  Presentation  General Appearance:  Appropriate for Environment  Eye Contact: Fair  Speech: Clear and Coherent  Speech Volume: Normal  Handedness: Right   Mood and Affect  Mood: Euthymic  Affect: Congruent   Thought Process  Thought Processes: Coherent  Descriptions of Associations:Intact  Orientation:Full (Time, Place and Person)  Thought Content:Logical  Diagnosis of Schizophrenia or Schizoaffective disorder in past: Yes  Duration of Psychotic Symptoms: No data recorded  Hallucinations:Hallucinations: None  Ideas of Reference:None  Suicidal Thoughts:Suicidal Thoughts: No  Homicidal Thoughts:Homicidal Thoughts: No   Sensorium  Memory: Immediate Fair  Judgment: Fair  Insight: Fair   Community education officer  Concentration: Fair  Attention Span: Fair  Recall: [employer] of Knowledge: Fair  Language: Fair   Psychomotor Activity  Psychomotor Activity: Psychomotor Activity: Normal   Assets  Assets: Communication Skills; Desire for Improvement; Leisure Time; Physical Health   Sleep  Sleep: Sleep: Fair Number of Hours of Sleep: 8   Nutritional Assessment (For OBS and FBC admissions only) Has the patient had a weight loss or gain of 10 pounds or more in the last 3 months?: No Has the patient had a decrease in food intake/or appetite?: No Does the patient have dental problems?: No Does the patient have eating habits or behaviors that may be indicators of an eating disorder including binging or inducing vomiting?: No Has the patient  recently lost weight without trying?: 0 Has the patient been eating poorly because of a decreased appetite?: 0 Malnutrition Screening Tool Score: 0    Physical Exam  Physical Exam HENT:     Head: Normocephalic.     Nose: Nose normal.  Eyes:     Conjunctiva/sclera: Conjunctivae normal.  Cardiovascular:     Rate and Rhythm: Normal rate.  Pulmonary:     Effort: Pulmonary effort is normal.  Musculoskeletal:        General: Normal range of motion.  Neurological:     Mental Status: She is alert and oriented to person, place, and time.    Review of Systems  Constitutional: Negative.   HENT: Negative.    Eyes: Negative.   Respiratory: Negative.    Cardiovascular: Negative.   Gastrointestinal: Negative.   Genitourinary: Negative.   Musculoskeletal: Negative.   Neurological: Negative.   Endo/Heme/Allergies: Negative.    Blood pressure (!) 141/89, pulse 93, temperature (!) 97 F (36.1 C), temperature source Oral, resp. rate 18, SpO2 97 %. There is no height or weight on file to calculate BMI.  Suicide Risk:  Minimal: No identifiable suicidal ideation.  Patients presenting with no risk factors but with morbid ruminations; may be classified as minimal risk based on the severity of the depressive symptoms  Plan Of Care/Follow-up recommendations:  Activity:  as tolerated  Current medication regimen.   apixaban  5 mg Oral BID   clozapine  50 mg Oral Daily   cloZAPine  75 mg Oral  QHS   diltiazem  240 mg Oral Daily   divalproex  500 mg Oral BID   fluticasone furoate-vilanterol  1 puff Inhalation Daily   insulin aspart  0-15 Units Subcutaneous TID WC   insulin glargine-yfgn  12 Units Subcutaneous Daily   latanoprost  1 drop Both Eyes QHS   metFORMIN  500 mg Oral BID WC   mupirocin ointment  1 Application Topical TID   nicotine  7 mg Transdermal Daily   Vitamin D (Ergocalciferol)  50,000 Units Oral Q7 days    Disposition: Patient does not meet criteria for inpatient  psychiatric treatment. Patient psychiatrically cleared and is currently boarding here at the [facility] Patient is awaiting discharge placement and coordination of care from [place]. Per chart review, Patient unable to return to group home unless she has been assigned a guardian. Patient unable to care for herself and has been boarding on the continuous assessment unit while awaiting DSS caseworker to find appropriate placement. Social work/TOC working with DSS to find placement.   Denyse Fillion L, NP 12/03/2021, 9:45 AM

## 2021-12-03 NOTE — ED Notes (Signed)
Patient A&Ox4. Denies intent to harm self/others when asked. Denies A/VH. Patient denies any physical complaints when asked. No acute distress noted. Pt received am medication without difficulty. Routine safety checks conducted according to facility protocol. Encouraged patient to notify staff if thoughts of harm toward self or others arise. Patient verbalize understanding and agreement. Will continue to monitor for safety.

## 2021-12-03 NOTE — ED Notes (Signed)
CBG 284 

## 2021-12-03 NOTE — Care Management (Signed)
Writer left a HIPPPA compliant vice mail message for [place] with [facility] [phone]) regarding the status of placement for the patient.   Writer has sent an email to [employer] requesting a treatment team meeting for the patient's safe discharge plan and daily updates towards her discharge.

## 2021-12-03 NOTE — Inpatient Diabetes Management (Signed)
Inpatient Diabetes Program Recommendations  AACE/ADA: New Consensus Statement on Inpatient Glycemic Control (2015)  Target Ranges:  Prepandial:   less than 140 mg/dL      Peak postprandial:   less than 180 mg/dL (1-2 hours)      Critically ill patients:  140 - 180 mg/dL   Lab Results  Component Value Date   GLUCAP 220 (H) 12/03/2021   HGBA1C 7.0 (H) 10/04/2021    Review of Glycemic Control  Latest Reference Range & Units 12/02/21 21:02 12/03/21 08:11  Glucose-Capillary 70 - 99 mg/dL 284 (H) 220 (H)  (H): Data is abnormally high   Current orders for Inpatient glycemic control:  Novolog 0-15 units TID Semglee 12 units QD Metformin 500 mg BID  Inpatient Diabetes Program Recommendations:    Novolog 0-5 units QHS  Will continue to follow while inpatient.  Thank you, Reche Dixon, MSN, [place] Diabetes Coordinator Inpatient Diabetes Program [phone] (team pager from 8a-5p)

## 2021-12-03 NOTE — ED Notes (Signed)
Pt received blood draw x 1 stick to L outer AC. Tolerated well. Safety maintained.

## 2021-12-04 DIAGNOSIS — F209 Schizophrenia, unspecified: Secondary | ICD-10-CM

## 2021-12-04 LAB — CBC WITH DIFFERENTIAL/PLATELET
Abs Immature Granulocytes: 0.02 10*3/uL (ref 0.00–0.07)
Basophils Absolute: 0.1 10*3/uL (ref 0.0–0.1)
Basophils Relative: 1 %
Eosinophils Absolute: 0.1 10*3/uL (ref 0.0–0.5)
Eosinophils Relative: 1 %
HCT: 36.7 % (ref 36.0–46.0)
Hemoglobin: 11.5 g/dL — ABNORMAL LOW (ref 12.0–15.0)
Immature Granulocytes: 0 %
Lymphocytes Relative: 29 %
Lymphs Abs: 1.9 10*3/uL (ref 0.7–4.0)
MCH: 25.5 pg — ABNORMAL LOW (ref 26.0–34.0)
MCHC: 31.3 g/dL (ref 30.0–36.0)
MCV: 81.4 fL (ref 80.0–100.0)
Monocytes Absolute: 0.6 10*3/uL (ref 0.1–1.0)
Monocytes Relative: 9 %
Neutro Abs: 3.8 10*3/uL (ref 1.7–7.7)
Neutrophils Relative %: 60 %
Platelets: 254 10*3/uL (ref 150–400)
RBC: 4.51 MIL/uL (ref 3.87–5.11)
RDW: 17.3 % — ABNORMAL HIGH (ref 11.5–15.5)
WBC: 6.5 10*3/uL (ref 4.0–10.5)
nRBC: 0 % (ref 0.0–0.2)

## 2021-12-04 LAB — GLUCOSE, CAPILLARY
Glucose-Capillary: 99 mg/dL (ref 70–99)
Glucose-Capillary: 217 mg/dL — ABNORMAL HIGH (ref 70–99)
Glucose-Capillary: 177 mg/dL — ABNORMAL HIGH (ref 70–99)
Glucose-Capillary: 206 mg/dL — ABNORMAL HIGH (ref 70–99)

## 2021-12-04 NOTE — ED Notes (Signed)
Pt is A&Ox 4. Lying on the stretcher watching TV. Pt is calm and cooperative. Will continue to monitor.

## 2021-12-04 NOTE — ED Notes (Signed)
Pt accepted scheduled morning meds w/o difficulty. Breakfast provided earlier in the shift and snack provided now. Denies SI/HI/AVH. Safety maintained and will continue to monitor.

## 2021-12-04 NOTE — ED Notes (Signed)
[facility] called to collect STAT specimens and to deliver to [facility]. 

## 2021-12-04 NOTE — ED Provider Notes (Signed)
Patient seen and reevaluated face-to-face by this provider, and chart reviewed. Patient is alert and oriented x 4. Her thought process is linear and speech is clear and coherent. Her mood is euthymic and affect is congruent. Patient denies SI/HI/AVH. There is no objective evidence that the patient is currently responding to internal or external stimuli. She reports fair sleep. She reports a fair appetite. She states that she took a shower last night and appears casually dressed today. She denies symptoms of depression or anxiety today. She is medication compliant and denies medication side effects. She denies physical symptoms including muscle spasms, shortness of breath, chest pains, nausea, or vomiting, or involuntary movement. She asked if there are any updates because she is ready to go. She was advised that we are currently awaiting DSS guardianship and placement. I discussed with her family support. She states that she has brothers and sisters that live in [place].  She states that sometimes brother would visit her at the group home. She states that she does not have her brother's number.  Patient is psychiatrically cleared and does not meet criteria for inpatient psychiatric treatment. Patient unable to return to group home unless she has been assigned a guardian. Patient unable to care for herself and has been boarding on the continuous assessment unit while awaiting DSS caseworker to find appropriate placement. Social work/TOC working with DSS to find placement.

## 2021-12-04 NOTE — ED Notes (Signed)
Pt sleeping at present, no distress noted.  Monitoring for safety. 

## 2021-12-04 NOTE — ED Notes (Signed)
Pt sleeping at present. Will continue to monitor for safety.

## 2021-12-05 DIAGNOSIS — F209 Schizophrenia, unspecified: Secondary | ICD-10-CM | POA: Diagnosis not present

## 2021-12-05 LAB — URINALYSIS, ROUTINE W REFLEX MICROSCOPIC
Bilirubin Urine: NEGATIVE
Glucose, UA: NEGATIVE mg/dL
Hgb urine dipstick: NEGATIVE
Ketones, ur: NEGATIVE mg/dL
Leukocytes,Ua: NEGATIVE
Nitrite: NEGATIVE
Protein, ur: NEGATIVE mg/dL
Specific Gravity, Urine: 1.008 (ref 1.005–1.030)
pH: 5 (ref 5.0–8.0)

## 2021-12-05 LAB — GLUCOSE, CAPILLARY
Glucose-Capillary: 130 mg/dL — ABNORMAL HIGH (ref 70–99)
Glucose-Capillary: 159 mg/dL — ABNORMAL HIGH (ref 70–99)
Glucose-Capillary: 152 mg/dL — ABNORMAL HIGH (ref 70–99)

## 2021-12-05 NOTE — Progress Notes (Signed)
Received Teresa Murillo at the change of shift lying quietly in her bed. Shortly thereafter she verbalized several requests. Her requests were granted.

## 2021-12-05 NOTE — ED Notes (Signed)
[facility] called to collect STAT specimen and to deliver to [facility].

## 2021-12-05 NOTE — ED Notes (Signed)
Pt accepted scheduled meds w/o difficulty. Pt took shower. Pt currently resting on pull out bed/chair in no distress. Safety maintained and will continue to monitor.

## 2021-12-05 NOTE — ED Notes (Signed)
Pt given 2 rice krispie cereals and milk and coffee

## 2021-12-05 NOTE — Progress Notes (Addendum)
Patient seen and reevaluated face-to-face by this provider, and chart reviewed. Patient is alert and oriented x 4. Her thought process is linear and speech is clear and coherent. Her mood is euthymic and affect is congruent. Patient denies SI/HI/AVH. There is no objective evidence that the patient is currently responding to internal or external stimuli.   She reports tossing and turning last night. States that she feels a little tired today but is okay. She reports a fair appetite. She denies symptoms of depression or anxiety today. She is medication compliant and denies medication side effects. She denies physical symptoms including muscle spasms, shortness of breath, chest pains, nausea, or vomiting, or involuntary movement.   Patient is psychiatrically cleared and does not meet criteria for inpatient psychiatric treatment. Patient unable to return to group home unless she has been assigned a guardian. Patient unable to care for herself and has been boarding on the continuous assessment unit while awaiting DSS caseworker to find appropriate placement. Social work/TOC working with DSS to find placement.    Patient's HR 120. Cardizem was administered. EKG obtained. No prolonged QTC. Vent rate 119. Patient is asymptomatic. Will continue to monitor HR and BP. Per chart review on 11/08/21, Patient's "Vital signs in the ED significant for heart rate ranging as high as the 190s but typically in 100s to 120s.Newly diagnosed likely paroxysmal atrial flutter with RVR with [place] vas 2 score of 2 or higher."

## 2021-12-05 NOTE — ED Notes (Signed)
Pt sleeping at present, no distress noted.  Monitoring for safety. 

## 2021-12-06 DIAGNOSIS — F209 Schizophrenia, unspecified: Secondary | ICD-10-CM | POA: Diagnosis not present

## 2021-12-06 LAB — GLUCOSE, CAPILLARY
Glucose-Capillary: 122 mg/dL — ABNORMAL HIGH (ref 70–99)
Glucose-Capillary: 113 mg/dL — ABNORMAL HIGH (ref 70–99)
Glucose-Capillary: 181 mg/dL — ABNORMAL HIGH (ref 70–99)

## 2021-12-06 NOTE — ED Notes (Signed)
Pt sitting up watching tv.   No distress noted.

## 2021-12-06 NOTE — ED Notes (Signed)
Remains asleep not sleep disturbance noted respirations remain easy .

## 2021-12-06 NOTE — ED Notes (Signed)
Pt sleeping on and off at this time currently appears asleep respirations appear easy skin color appropriate for ethnicity.

## 2021-12-06 NOTE — ED Notes (Signed)
Pt awake and alert . Denies SI, hI or AVH pt given morning meds and breakfast  no distress noted.

## 2021-12-06 NOTE — ED Notes (Signed)
Lying in bed but not asleep

## 2021-12-06 NOTE — Progress Notes (Signed)
Patient seen and reevaluated face-to-face by this provider, and chart reviewed. Patient is alert and oriented x 4. Her thought process is linear, and speech is clear and coherent. Her mood is dysphoric, and affect is congruent. Patient denies SI/HI/AVH. There is no objective evidence that the patient is currently responding to internal or external stimuli. She appears casually dressed and is noted to have changed her clothing daily. She continues to ask when she is leaving. She states that she is going to call the lady at the group home today. I discussed with the patient that the social worker continues to work with DSS on guardianship and placement. I advised her to speak with Ava tomorrow for an update from DSS. She verbalizes understanding. She continues to deny symptoms of depression or anxiety. She is medication compliant and denies medication side effects. She denies physical symptoms including muscle spasms, shortness of breath, chest pains, nausea, or vomiting, or involuntary movement.    Patient has been psychiatrically cleared and does not meet criteria for inpatient psychiatric treatment. Patient unable to return to group home unless she has been assigned a guardian. Patient unable to care for herself and has been boarding on the continuous assessment unit while awaiting DSS caseworker to find appropriate placement. Social work/TOC working with DSS to find placement.

## 2021-12-06 NOTE — ED Notes (Signed)
Currently sleeping no signs of distress or sleep disturbance respirations easy

## 2021-12-06 NOTE — ED Notes (Signed)
Pt sitting up in bed watching TV.  Pt denies si, HI or AVH.  She has blunted affect but brightens up with engagement.  Staff will continue to monitor for safety.

## 2021-12-06 NOTE — ED Notes (Signed)
No sleep disturbance noted pt remains a sleep.

## 2021-12-07 ENCOUNTER — Encounter (HOSPITAL_COMMUNITY): Payer: Self-pay | Admitting: Registered Nurse

## 2021-12-07 DIAGNOSIS — F209 Schizophrenia, unspecified: Secondary | ICD-10-CM | POA: Diagnosis not present

## 2021-12-07 LAB — GLUCOSE, CAPILLARY
Glucose-Capillary: 171 mg/dL — ABNORMAL HIGH (ref 70–99)
Glucose-Capillary: 193 mg/dL — ABNORMAL HIGH (ref 70–99)
Glucose-Capillary: 110 mg/dL — ABNORMAL HIGH (ref 70–99)
Glucose-Capillary: 102 mg/dL — ABNORMAL HIGH (ref 70–99)
Glucose-Capillary: 129 mg/dL — ABNORMAL HIGH (ref 70–99)
Glucose-Capillary: 145 mg/dL — ABNORMAL HIGH (ref 70–99)

## 2021-12-07 NOTE — ED Provider Notes (Signed)
  HPI:  Teresa Murillo 61 year old female with a past psychiatric history of schizophrenia, aggressive behavior, and possible intellectual disability presented to [facility] on 11/23/21 voluntarily as a walk in via [facility] with complaints that she was locked out of the group home.  Reports that owner of group home called police but doesn't know why.  Group homeowner [name] at [phone] reports patient only living there temporarily while DSS look for another place to live.  Reports patient has already been discharged from facility. Reported unwilling to take back unless patient has a guardian related to patient leaving, staying gone for days and missing doses of medications.  Reports that patient is unable to care for herself, needs reminders to shower and complete ADL, also needs assistance with medication management.   Teresa Murillo, 61 y.o., female patient seen face to face by this provider, consulted with Dr. Hampton Abbot; and chart reviewed on 12/07/21.  On evaluation Teresa Murillo reports she feels okay.  She wants to know if anyone knows when she is going to be discharged and where she is going to go.  States she is ready to go.  Teresa Murillo denies suicidal/self-harm/homicidal ideation, psychosis, and paranoia.  Reports she is eating and sleeping without difficulty and tolerating medications without adverse reaction.     During evaluation Teresa Murillo is sitting up on side of bed with no noted distress.  She is alert, oriented x 4, calm, cooperative and attentive.  /Her mood is euthymic with congruent affect.  She has normal speech, and behavior.  Objectively there is no evidence of psychosis/mania or delusional thinking.  Patient is able to converse coherently, goal directed thoughts, no distractibility, or pre-occupation.  She continues to deny suicidal/self-harm/homicidal ideation, psychosis, and paranoia.  There have been not behavioral outburst or behaviors that are a danger to self or  others.     Disposition:  Patient has been psychiatrically cleared and does not meet criteria for inpatient psychiatric treatment.  She is unable to return to group home unless she has been assigned a guardian. She is unable to care for herself and has been boarding on the continuous assessment unit while awaiting DSS caseworker to find appropriate placement. Social work/TOC working with DSS to find placement.    Teresa Stanke B. Shylo Dillenbeck, NP

## 2021-12-07 NOTE — Progress Notes (Signed)
Pt is awake, alert and oriented X4. Pt did not voice any complaints of pain or discomfort. No distress noted. Administered scheduled medications with no issue. Pt denies current SI/HI/AVH. Staff will monitor for pt's safety. 

## 2021-12-07 NOTE — ED Notes (Signed)
[name] is awake and in the bathroom. She is calm and cooperative. No c/o pain or distress. Will continue to monitor for safety

## 2021-12-08 DIAGNOSIS — F209 Schizophrenia, unspecified: Secondary | ICD-10-CM | POA: Diagnosis not present

## 2021-12-08 LAB — GLUCOSE, CAPILLARY
Glucose-Capillary: 107 mg/dL — ABNORMAL HIGH (ref 70–99)
Glucose-Capillary: 179 mg/dL — ABNORMAL HIGH (ref 70–99)
Glucose-Capillary: 145 mg/dL — ABNORMAL HIGH (ref 70–99)

## 2021-12-08 LAB — CLOZAPINE (CLOZARIL)
Clozapine Lvl: 87 ng/mL — ABNORMAL LOW (ref 350–600)
NorClozapine: 44 ng/mL
Total(Cloz+Norcloz): 131 ng/mL

## 2021-12-08 NOTE — ED Notes (Signed)
Pt is in the bed sleeping. Respirations are even and unlabored. No acute distress noted. Will continue to monitor for safety. 

## 2021-12-08 NOTE — ED Notes (Signed)
Pt is alert and awake, sitting on the bed. Respirations are even and unlabored. No acute distress noted . Will continue to monitor for safety.

## 2021-12-08 NOTE — ED Notes (Signed)
Remains asleep with no disturbed sleep patterns noted condition appears stable

## 2021-12-08 NOTE — Care Management (Signed)
Care Management   Writer has coordinated a Treatment/Placement Team Meeting for the patient on 12-10-2021 at 1:30pm.  This will be a virtual meeting on Teams.   Reason for the Meeting: Facilitation of a safe discharge from the [facility] to an appropriate placement facility.   The following Team Members listed below will participate in the Team meeting.  All efforts were made to identify natural, family or community support for the patient without success:  [place]  Dr. Dwyane Dee - Medical Director, [facility]  [place] Supervisor, [place], [place]  Teresa Murillo - Patient

## 2021-12-08 NOTE — ED Notes (Signed)
Pt accepted scheduled meds and insulin w/o difficulty. Breakfast provided. Pt currently resting on pull out bed/chair in no distress. Safety maintained and will continue to monitor.

## 2021-12-08 NOTE — Care Management (Addendum)
Care Management   Writer spoke to, [place] the owner of the facility [facility] [phone]).  Per [name] the patient has an updated CCA that was completed on 01-15-2019 and one in 2023.   Writer met with the patient and discussed her signing a consent to release information form to obtain a copy of her CCA from the group home to assist in the facilitation of placement.   Writer left a HIPPA compliant voice mail message with the [place] worker Lizabeth Leyden [phone]) informing her that the group home has a CCA from 2020 and 2023.   Patient signed the consent for the [facility] to receive her CCA from [place].  APS will be able to obtain a copy of the CCA from medical records if needed.   Writer has sent the 2020 CCA so that it can be scanned into epic. Writer is awaiting to receive the 2023 Engineer, technical sales coordinating with the Care Manager [facility] [phone]), APS worker Lizabeth Leyden [phone]), [place] Director, Dr. Dwyane Dee and Supervisor for Transition of [place] and the incoming [facility] LCSW working with the patient Teresa Murillo to establish a date and time to meet for a treatment team meeting for the patient. A tentative date of 12-10-2021 has been set for a treatment team meeting.

## 2021-12-08 NOTE — ED Provider Notes (Signed)
Behavioral Health Progress Note  Date and Time: 12/08/2021 7:09 PM Name: Teresa Murillo MRN:  [id number]  Subjective:  Teresa Murillo 61 year old female with a past psychiatric history of schizophrenia, aggressive behavior, and possible intellectual disability presented to [facility] on 11/23/21 voluntarily as a walk in via [facility] with complaints that she was locked out of the group home.  Patient had already been discharged from this facility but had been living there temporarily while DSS was looking for new placement.  Patient was brought to [facility] for assessment in the group home refused to allow her to return.  She has continued to board on the continuous assessment unit since that time.   Teresa Murillo, 61 y.o., female patient seen face to face by this provider, consulted with Dr. Hampton Abbot; and chart reviewed on 12/08/21.   On evaluation Teresa Murillo is observed laying in her bed asleep.  She is easily awakened.  She is alert/oriented x4, calm, and cooperative.  She is pleasant.  She has normal speech and behavior.  She denies depression but complains of being bored.  States "I just wish I could go I feel like I am going to be in here forever".  Provided reassurance and encouragement.  She continues to deny SI/HI/AVH.  Objectively she does not appear to be responding to internal/external stimuli.  Encouraged patient to shower in the a.m. and to partake in self-care.  She is tolerating all medications without any adverse reactions.  She has remained calm and cooperative while on the unit.  She is appropriate with staff and other patients.  She has received no as needed medications for agitation.   Per chart review there are no new updates on patient's placement, DSS continues to seek placement   Diagnosis:  Final diagnoses:  At risk for self care deficit  Noncompliance  Self-care deficit for medication administration  Schizophrenia, unspecified type ([place])    Total  Time spent with patient: 30 minutes  Past Psychiatric History: See H&P Past Medical History:  Past Medical History:  Diagnosis Date   Chronic obstructive pulmonary disease (COPD) ([place])    Glaucoma    Hyperlipidemia    Hypertension    Iron deficiency    Schizoaffective disorder ([place])    Type 2 diabetes mellitus ([place])     Past Surgical History:  Procedure Laterality Date   TUBAL LIGATION     Family History:  Family History  Problem Relation Age of Onset   Breast cancer Maternal Grandmother    Family Psychiatric  History: See H&P Social History:  Social History   Substance and Sexual Activity  Alcohol Use Yes     Social History   Substance and Sexual Activity  Drug Use Not Currently    Social History   Socioeconomic History   Marital status: Divorced    Spouse name: Not on file   Number of children: Not on file   Years of education: Not on file   Highest education level: Not on file  Occupational History   Not on file  Tobacco Use   Smoking status: Every Day    Packs/day: 1.00    Types: Cigars, Cigarettes   Smokeless tobacco: Current  Vaping Use   Vaping Use: Never used  Substance and Sexual Activity   Alcohol use: Yes   Drug use: Not Currently   Sexual activity: Not Currently    Birth control/protection: Surgical  Other Topics Concern   Not on file  Social History Narrative  Not on file   Social Determinants of Health   Financial Resource Strain: Not on file  Food Insecurity: Not on file  Transportation Needs: Not on file  Physical Activity: Not on file  Stress: Not on file  Social Connections: Not on file   SDOH:  SDOH Screenings   Depression (PHQ2-9): Low Risk  (11/23/2021)  Tobacco Use: High Risk (12/07/2021)   Additional Social History:    Pain Medications: See MAR Prescriptions: See MAR Over the Counter: See MAR History of alcohol / drug use?: No history of alcohol / drug abuse Longest period of sobriety (when/how long): N/A                     Sleep: Good  Appetite:  Good  Current Medications:  Current Facility-Administered Medications  Medication Dose Route Frequency Provider Last Rate Last Admin   acetaminophen (TYLENOL) tablet 650 mg  650 mg Oral Q6H PRN Rankin, Shuvon B, NP   650 mg at 11/28/21 2015   albuterol (VENTOLIN HFA) 108 (90 Base) MCG/ACT inhaler 2 puff  2 puff Inhalation Q6H PRN Rankin, Shuvon B, NP       alum & mag hydroxide-simeth (MAALOX/MYLANTA) 200-200-20 MG/5ML suspension 30 mL  30 mL Oral Q4H PRN Rankin, Shuvon B, NP       apixaban (ELIQUIS) tablet 5 mg  5 mg Oral BID Rankin, Shuvon B, NP   5 mg at 12/08/21 0915   cloZAPine (CLOZARIL) tablet 50 mg  50 mg Oral Daily [name], NP   50 mg at 12/08/21 0916   cloZAPine (CLOZARIL) tablet 75 mg  75 mg Oral QHS [name], NP   75 mg at 12/07/21 2111   diltiazem (CARDIZEM CD) 24 hr capsule 240 mg  240 mg Oral Daily Rankin, Shuvon B, NP   240 mg at 12/08/21 0916   divalproex (DEPAKOTE ER) 24 hr tablet 500 mg  500 mg Oral BID Rankin, Shuvon B, NP   500 mg at 12/08/21 0915   fluticasone furoate-vilanterol (BREO ELLIPTA) 200-25 MCG/ACT 1 puff  1 puff Inhalation Daily Rankin, Shuvon B, NP   1 puff at 12/08/21 0818   haloperidol (HALDOL) tablet 10 mg  10 mg Oral Q8H PRN Rankin, Shuvon B, NP       insulin aspart (novoLOG) injection 0-15 Units  0-15 Units Subcutaneous TID WC Rankin, Shuvon B, NP   2 Units at 12/08/21 1625   insulin glargine-yfgn (SEMGLEE) injection 12 Units  12 Units Subcutaneous Daily Rankin, Shuvon B, NP   12 Units at 12/08/21 0915   latanoprost (XALATAN) 0.005 % ophthalmic solution 1 drop  1 drop Both Eyes QHS Rankin, Shuvon B, NP   1 drop at 12/07/21 2116   magnesium hydroxide (MILK OF MAGNESIA) suspension 30 mL  30 mL Oral Daily PRN Rankin, Shuvon B, NP       metFORMIN (GLUCOPHAGE) tablet 500 mg  500 mg Oral BID WC Rankin, Shuvon B, NP   500 mg at 12/08/21 1625   mupirocin ointment (BACTROBAN) 2 % 1 Application  1 Application  Topical TID Rankin, Shuvon B, NP   1 Application at 94/76/54 1625   nicotine (NICODERM CQ - dosed in mg/24 hr) patch 7 mg  7 mg Transdermal Daily Rankin, Shuvon B, NP   7 mg at 12/08/21 0915   Vitamin D (Ergocalciferol) (DRISDOL) 1.25 MG (50000 UNIT) capsule 50,000 Units  50,000 Units Oral Q7 days Rankin, Shuvon B, NP   50,000 Units at 12/03/21 6503  Current Outpatient Medications  Medication Sig Dispense Refill   Accu-Chek Softclix Lancets lancets Use as directed up to four times daily 100 each 0   apixaban (ELIQUIS) 5 MG TABS tablet Take 1 tablet (5 mg total) by mouth 2 (two) times daily. 60 tablet 0   ARIPiprazole (ABILIFY) 10 MG tablet Take 1 tablet (10 mg total) by mouth daily. 30 tablet 0   Blood Glucose Monitoring Suppl (ACCU-CHEK GUIDE) w/Device KIT Use as directed up to four times daily 1 kit 0   budesonide-formoterol (SYMBICORT) 160-4.5 MCG/ACT inhaler Inhale 2 puffs into the lungs in the morning and at bedtime.     Cholecalciferol (VITAMIN D3) 1.25 MG (50000 UT) CAPS Take 50,000 Units by mouth every Thursday.     cloZAPine (CLOZARIL) 25 MG tablet Take 3 tablets (75 mg total) by mouth at bedtime. 90 tablet 0   clozapine (CLOZARIL) 50 MG tablet Take 1 tablet (50 mg total) by mouth daily. 30 tablet 0   diltiazem (CARDIZEM CD) 240 MG 24 hr capsule Take 1 capsule (240 mg total) by mouth daily. (Patient not taking: Reported on 11/24/2021) 30 capsule 0   divalproex (DEPAKOTE ER) 500 MG 24 hr tablet Take 1 tablet (500 mg total) by mouth 2 (two) times daily. 60 tablet 0   glucose blood test strip Use as directed up to four times daily 50 each 0   haloperidol (HALDOL) 10 MG tablet Take 1 tablet (10 mg total) by mouth 3 (three) times daily as needed for agitation (and psychotic symptoms).     INGREZZA 40 MG capsule Take 1 capsule (40 mg total) by mouth in the morning. 30 capsule 0   insulin aspart (NOVOLOG) 100 UNIT/ML FlexPen Before each meal 3 times a day, 140-199 - 2 units, 200-250 - 4 units,  251-299 - 6 units,  300-349 - 8 units,  350 or above 10 units. Insulin PEN if approved, provide syringes and needles if needed.Please switch to any approved short acting Insulin if needed. 15 mL 0   insulin glargine (LANTUS) 100 UNIT/ML Solostar Pen Inject 12 Units into the skin daily. 15 mL 0   Insulin Pen Needle 32G X 4 MM MISC Use 4 times a day with insulin, 1 month supply. 100 each 0   latanoprost (XALATAN) 0.005 % ophthalmic solution Place 1 drop into both eyes at bedtime.     metFORMIN (GLUCOPHAGE) 500 MG tablet Take 500 mg by mouth 2 (two) times daily with a meal.     nicotine (NICODERM CQ - DOSED IN MG/24 HR) 7 mg/24hr patch Place 1 patch (7 mg total) onto the skin daily. 28 patch 0   PROAIR HFA 108 (90 Base) MCG/ACT inhaler Inhale 2 puffs into the lungs every 6 (six) hours as needed for wheezing or shortness of breath.      Labs  Lab Results:  Admission on 11/23/2021  Component Date Value Ref Range Status   SARS Coronavirus 2 by RT PCR 11/23/2021 NEGATIVE  NEGATIVE Final   Comment: (NOTE) SARS-CoV-2 target nucleic acids are NOT DETECTED.  The SARS-CoV-2 RNA is generally detectable in upper respiratory specimens during the acute phase of infection. The lowest concentration of SARS-CoV-2 viral copies this assay can detect is 138 copies/mL. A negative result does not preclude SARS-Cov-2 infection and should not be used as the sole basis for treatment or other patient management decisions. A negative result may occur with  improper specimen collection/handling, submission of specimen other than nasopharyngeal swab, presence of viral mutation(s)  within the areas targeted by this assay, and inadequate number of viral copies(<138 copies/mL). A negative result must be combined with clinical observations, patient history, and epidemiological information. The expected result is Negative.  Fact Sheet for Patients:  EntrepreneurPulse.com.au  Fact Sheet for Healthcare  Providers:  IncredibleEmployment.be  This test is no                          t yet approved or cleared by the [place] FDA and  has been authorized for detection and/or diagnosis of SARS-CoV-2 by FDA under an Emergency Use Authorization (EUA). This EUA will remain  in effect (meaning this test can be used) for the duration of the COVID-19 declaration under Section 564(b)(1) of the Act, 21 U.S.C.section 360bbb-3(b)(1), unless the authorization is terminated  or revoked sooner.       Influenza A by PCR 11/23/2021 NEGATIVE  NEGATIVE Final   Influenza B by PCR 11/23/2021 NEGATIVE  NEGATIVE Final   Comment: (NOTE) The Xpert Xpress SARS-CoV-2/FLU/RSV plus assay is intended as an aid in the diagnosis of influenza from Nasopharyngeal swab specimens and should not be used as a sole basis for treatment. Nasal washings and aspirates are unacceptable for Xpert Xpress SARS-CoV-2/FLU/RSV testing.  Fact Sheet for Patients: EntrepreneurPulse.com.au  Fact Sheet for Healthcare Providers: IncredibleEmployment.be  This test is not yet approved or cleared by the [place] FDA and has been authorized for detection and/or diagnosis of SARS-CoV-2 by FDA under an Emergency Use Authorization (EUA). This EUA will remain in effect (meaning this test can be used) for the duration of the COVID-19 declaration under Section 564(b)(1) of the Act, 21 U.S.C. section 360bbb-3(b)(1), unless the authorization is terminated or revoked.  Performed at [facility], [place] [address] [place], [place] [address]    WBC 11/23/2021 7.4  4.0 - 10.5 K/uL Final   RBC 11/23/2021 4.25  3.87 - 5.11 MIL/uL Final   Hemoglobin 11/23/2021 11.1 (L)  12.0 - 15.0 g/dL Final   HCT 11/23/2021 34.6 (L)  36.0 - 46.0 % Final   MCV 11/23/2021 81.4  80.0 - 100.0 fL Final   MCH 11/23/2021 26.1  26.0 - 34.0 pg Final   MCHC 11/23/2021 32.1  30.0 - 36.0 g/dL Final   RDW  11/23/2021 17.7 (H)  11.5 - 15.5 % Final   Platelets 11/23/2021 365  150 - 400 K/uL Final   nRBC 11/23/2021 0.0  0.0 - 0.2 % Final   Neutrophils Relative % 11/23/2021 59  % Final   Neutro Abs 11/23/2021 4.3  1.7 - 7.7 K/uL Final   Lymphocytes Relative 11/23/2021 31  % Final   Lymphs Abs 11/23/2021 2.3  0.7 - 4.0 K/uL Final   Monocytes Relative 11/23/2021 7  % Final   Monocytes Absolute 11/23/2021 0.5  0.1 - 1.0 K/uL Final   Eosinophils Relative 11/23/2021 2  % Final   Eosinophils Absolute 11/23/2021 0.1  0.0 - 0.5 K/uL Final   Basophils Relative 11/23/2021 1  % Final   Basophils Absolute 11/23/2021 0.1  0.0 - 0.1 K/uL Final   Immature Granulocytes 11/23/2021 0  % Final   Abs Immature Granulocytes 11/23/2021 0.01  0.00 - 0.07 K/uL Final   Performed at [facility], [place] [address], [place], [place] [address]   Sodium 11/23/2021 139  135 - 145 mmol/L Final   Potassium 11/23/2021 4.1  3.5 - 5.1 mmol/L Final   Chloride 11/23/2021 105  98 -  111 mmol/L Final   CO2 11/23/2021 26  22 - 32 mmol/L Final   Glucose, Bld 11/23/2021 101 (H)  70 - 99 mg/dL Final   Glucose reference range applies only to samples taken after fasting for at least 8 hours.   BUN 11/23/2021 5 (L)  6 - 20 mg/dL Final   Creatinine, Ser 11/23/2021 0.51  0.44 - 1.00 mg/dL Final   Calcium 11/23/2021 9.0  8.9 - 10.3 mg/dL Final   Total Protein 11/23/2021 6.7  6.5 - 8.1 g/dL Final   Albumin 11/23/2021 3.6  3.5 - 5.0 g/dL Final   AST 11/23/2021 24  15 - 41 U/L Final   ALT 11/23/2021 18  0 - 44 U/L Final   Alkaline Phosphatase 11/23/2021 62  38 - 126 U/L Final   Total Bilirubin 11/23/2021 0.4  0.3 - 1.2 mg/dL Final   GFR, Estimated 11/23/2021 >60  >60 mL/min Final   Comment: (NOTE) Calculated using the CKD-EPI Creatinine Equation (2021)    Anion gap 11/23/2021 8  5 - 15 Final   Performed at [facility] [address] [place], [place] [address]   Magnesium 11/23/2021 2.0  1.7 - 2.4 mg/dL Final   Performed at  [facility] [address] [place], [place] [address]   Alcohol, Ethyl (B) 11/23/2021 <10  <10 mg/dL Final   Comment: (NOTE) Lowest detectable limit for serum alcohol is 10 mg/dL.  For medical purposes only. Performed at [facility], [place] [address], [place], [place] [address]    Prolactin 11/23/2021 3.8 (L)  4.8 - 23.3 ng/mL Final   Comment: (NOTE) Performed At: [facility] [place], [place] [id number] Rush Farmer MD UV:[id number]    Color, Urine 11/23/2021 YELLOW  YELLOW Final   APPearance 11/23/2021 CLEAR  CLEAR Final   Specific Gravity, Urine 11/23/2021 1.025  1.005 - 1.030 Final   pH 11/23/2021 6.5  5.0 - 8.0 Final   Glucose, UA 11/23/2021 NEGATIVE  NEGATIVE mg/dL Final   Hgb urine dipstick 11/23/2021 NEGATIVE  NEGATIVE Final   Bilirubin Urine 11/23/2021 NEGATIVE  NEGATIVE Final   Ketones, ur 11/23/2021 NEGATIVE  NEGATIVE mg/dL Final   Protein, ur 11/23/2021 NEGATIVE  NEGATIVE mg/dL Final   Nitrite 11/23/2021 NEGATIVE  NEGATIVE Final   Leukocytes,Ua 11/23/2021 NEGATIVE  NEGATIVE Final   Microscopic not done on urines with negative protein, blood, leukocytes, nitrite, or glucose < 500 mg/dL.   RBC / HPF 11/23/2021 0-5  0 - 5 RBC/hpf Final   WBC, UA 11/23/2021 0-5  0 - 5 WBC/hpf Final   Bacteria, UA 11/23/2021 NONE SEEN  NONE SEEN Final   Squamous Epithelial / LPF 11/23/2021 0-5  0 - 5 Final   Mucus 11/23/2021 PRESENT   Final   Performed at [facility], [place] [address] [place], [place] [address]   POC Amphetamine UR 11/23/2021 None Detected  NONE DETECTED (Cut Off Level 1000 ng/mL) Final   POC Secobarbital (BAR) 11/23/2021 None Detected  NONE DETECTED (Cut Off Level 300 ng/mL) Final   POC Buprenorphine (BUP) 11/23/2021 None Detected  NONE DETECTED (Cut Off Level 10 ng/mL) Final   POC Oxazepam (BZO) 11/23/2021 None Detected  NONE DETECTED (Cut Off Level 300 ng/mL) Final   POC Cocaine UR 11/23/2021 None Detected  NONE DETECTED (Cut  Off Level 300 ng/mL) Final   POC Methamphetamine UR 11/23/2021 None Detected  NONE DETECTED (Cut Off Level 1000 ng/mL) Final   POC Morphine 11/23/2021  None Detected  NONE DETECTED (Cut Off Level 300 ng/mL) Final   POC Methadone UR 11/23/2021 None Detected  NONE DETECTED (Cut Off Level 300 ng/mL) Final   POC Oxycodone UR 11/23/2021 None Detected  NONE DETECTED (Cut Off Level 100 ng/mL) Final   POC Marijuana UR 11/23/2021 None Detected  NONE DETECTED (Cut Off Level 50 ng/mL) Final   SARSCOV2ONAVIRUS 2 AG 11/23/2021 NEGATIVE  NEGATIVE Final   Comment: (NOTE) SARS-CoV-2 antigen NOT DETECTED.   Negative results are presumptive.  Negative results do not preclude SARS-CoV-2 infection and should not be used as the sole basis for treatment or other patient management decisions, including infection  control decisions, particularly in the presence of clinical signs and  symptoms consistent with COVID-19, or in those who have been in contact with the virus.  Negative results must be combined with clinical observations, patient history, and epidemiological information. The expected result is Negative.  Fact Sheet for Patients: HandmadeRecipes.com.cy  Fact Sheet for Healthcare Providers: FuneralLife.at  This test is not yet approved or cleared by the [place] FDA and  has been authorized for detection and/or diagnosis of SARS-CoV-2 by FDA under an Emergency Use Authorization (EUA).  This EUA will remain in effect (meaning this test can be used) for the duration of  the COV                          ID-19 declaration under Section 564(b)(1) of the Act, 21 U.S.C. section 360bbb-3(b)(1), unless the authorization is terminated or revoked sooner.     Cholesterol 11/23/2021 171  0 - 200 mg/dL Final   Triglycerides 11/23/2021 125  <150 mg/dL Final   HDL 11/23/2021 65  >40 mg/dL Final   Total CHOL/HDL Ratio 11/23/2021 2.6  RATIO Final   VLDL  11/23/2021 25  0 - 40 mg/dL Final   LDL Cholesterol 11/23/2021 81  0 - 99 mg/dL Final   Comment:        Total Cholesterol/HDL:CHD Risk Coronary Heart Disease Risk Table                     Men   Women  1/2 Average Risk   3.4   3.3  Average Risk       5.0   4.4  2 X Average Risk   9.6   7.1  3 X Average Risk  23.4   11.0        Use the calculated Patient Ratio above and the CHD Risk Table to determine the patient's CHD Risk.        ATP III CLASSIFICATION (LDL):  <100     mg/dL   Optimal  100-129  mg/dL   Near or Above                    Optimal  130-159  mg/dL   Borderline  160-189  mg/dL   High  >190     mg/dL   Very High Performed at [facility] [address], [place], [place] [address]    TSH 11/23/2021 1.788  0.350 - 4.500 uIU/mL Final   Comment: Performed by a 3rd Generation assay with a functional sensitivity of <=0.01 uIU/mL. Performed at [facility], [place] [address] [place], [place] [address]    Glucose-Capillary 11/23/2021 137 (H)  70 - 99 mg/dL Final   Glucose reference range applies only to samples taken after fasting for at  least 8 hours.   Glucose-Capillary 11/24/2021 144 (H)  70 - 99 mg/dL Final   Glucose reference range applies only to samples taken after fasting for at least 8 hours.   Glucose-Capillary 11/24/2021 87  70 - 99 mg/dL Final   Glucose reference range applies only to samples taken after fasting for at least 8 hours.   Glucose-Capillary 11/24/2021 269 (H)  70 - 99 mg/dL Final   Glucose reference range applies only to samples taken after fasting for at least 8 hours.   Glucose-Capillary 11/25/2021 112 (H)  70 - 99 mg/dL Final   Glucose reference range applies only to samples taken after fasting for at least 8 hours.   Glucose-Capillary 11/25/2021 136 (H)  70 - 99 mg/dL Final   Glucose reference range applies only to samples taken after fasting for at least 8 hours.   Glucose-Capillary 11/25/2021 187 (H)  70 - 99 mg/dL Final   Glucose  reference range applies only to samples taken after fasting for at least 8 hours.   Glucose-Capillary 11/25/2021 159 (H)  70 - 99 mg/dL Final   Glucose reference range applies only to samples taken after fasting for at least 8 hours.   Glucose-Capillary 11/26/2021 122 (H)  70 - 99 mg/dL Final   Glucose reference range applies only to samples taken after fasting for at least 8 hours.   Glucose-Capillary 11/26/2021 140 (H)  70 - 99 mg/dL Final   Glucose reference range applies only to samples taken after fasting for at least 8 hours.   Glucose-Capillary 11/26/2021 102 (H)  70 - 99 mg/dL Final   Glucose reference range applies only to samples taken after fasting for at least 8 hours.   Glucose-Capillary 11/26/2021 166 (H)  70 - 99 mg/dL Final   Glucose reference range applies only to samples taken after fasting for at least 8 hours.   Glucose-Capillary 11/27/2021 133 (H)  70 - 99 mg/dL Final   Glucose reference range applies only to samples taken after fasting for at least 8 hours.   Glucose-Capillary 11/27/2021 146 (H)  70 - 99 mg/dL Final   Glucose reference range applies only to samples taken after fasting for at least 8 hours.   Glucose-Capillary 11/27/2021 144 (H)  70 - 99 mg/dL Final   Glucose reference range applies only to samples taken after fasting for at least 8 hours.   Glucose-Capillary 11/27/2021 154 (H)  70 - 99 mg/dL Final   Glucose reference range applies only to samples taken after fasting for at least 8 hours.   Glucose-Capillary 11/28/2021 107 (H)  70 - 99 mg/dL Final   Glucose reference range applies only to samples taken after fasting for at least 8 hours.   Glucose-Capillary 11/28/2021 166 (H)  70 - 99 mg/dL Final   Glucose reference range applies only to samples taken after fasting for at least 8 hours.   Glucose-Capillary 11/28/2021 89  70 - 99 mg/dL Final   Glucose reference range applies only to samples taken after fasting for at least 8 hours.   Comment 1 11/28/2021  NURRP   Final   Glucose-Capillary 11/28/2021 243 (H)  70 - 99 mg/dL Final   Glucose reference range applies only to samples taken after fasting for at least 8 hours.   Glucose-Capillary 11/29/2021 193 (H)  70 - 99 mg/dL Final   Glucose reference range applies only to samples taken after fasting for at least 8 hours.   Glucose-Capillary 11/29/2021 164 (H)  70 -  99 mg/dL Final   Glucose reference range applies only to samples taken after fasting for at least 8 hours.   Glucose-Capillary 11/29/2021 192 (H)  70 - 99 mg/dL Final   Glucose reference range applies only to samples taken after fasting for at least 8 hours.   WBC 11/30/2021 5.8  4.0 - 10.5 K/uL Final   RBC 11/30/2021 4.40  3.87 - 5.11 MIL/uL Final   Hemoglobin 11/30/2021 11.3 (L)  12.0 - 15.0 g/dL Final   HCT 11/30/2021 35.2 (L)  36.0 - 46.0 % Final   MCV 11/30/2021 80.0  80.0 - 100.0 fL Final   MCH 11/30/2021 25.7 (L)  26.0 - 34.0 pg Final   MCHC 11/30/2021 32.1  30.0 - 36.0 g/dL Final   RDW 11/30/2021 17.2 (H)  11.5 - 15.5 % Final   Platelets 11/30/2021 314  150 - 400 K/uL Final   nRBC 11/30/2021 0.0  0.0 - 0.2 % Final   Neutrophils Relative % 11/30/2021 52  % Final   Neutro Abs 11/30/2021 2.9  1.7 - 7.7 K/uL Final   Lymphocytes Relative 11/30/2021 38  % Final   Lymphs Abs 11/30/2021 2.2  0.7 - 4.0 K/uL Final   Monocytes Relative 11/30/2021 7  % Final   Monocytes Absolute 11/30/2021 0.4  0.1 - 1.0 K/uL Final   Eosinophils Relative 11/30/2021 2  % Final   Eosinophils Absolute 11/30/2021 0.1  0.0 - 0.5 K/uL Final   Basophils Relative 11/30/2021 1  % Final   Basophils Absolute 11/30/2021 0.1  0.0 - 0.1 K/uL Final   Immature Granulocytes 11/30/2021 0  % Final   Abs Immature Granulocytes 11/30/2021 0.01  0.00 - 0.07 K/uL Final   Performed at [facility], [place] [address] [place], [place] [address]   Glucose-Capillary 11/29/2021 150 (H)  70 - 99 mg/dL Final   Glucose reference range applies only to samples taken after  fasting for at least 8 hours.   Glucose-Capillary 11/30/2021 120 (H)  70 - 99 mg/dL Final   Glucose reference range applies only to samples taken after fasting for at least 8 hours.   Glucose-Capillary 11/30/2021 86  70 - 99 mg/dL Final   Glucose reference range applies only to samples taken after fasting for at least 8 hours.   Glucose-Capillary 11/30/2021 123 (H)  70 - 99 mg/dL Final   Glucose reference range applies only to samples taken after fasting for at least 8 hours.   Glucose-Capillary 11/30/2021 161 (H)  70 - 99 mg/dL Final   Glucose reference range applies only to samples taken after fasting for at least 8 hours.   Glucose-Capillary 12/01/2021 123 (H)  70 - 99 mg/dL Final   Glucose reference range applies only to samples taken after fasting for at least 8 hours.   Glucose-Capillary 12/01/2021 193 (H)  70 - 99 mg/dL Final   Glucose reference range applies only to samples taken after fasting for at least 8 hours.   Glucose-Capillary 12/01/2021 161 (H)  70 - 99 mg/dL Final   Glucose reference range applies only to samples taken after fasting for at least 8 hours.   Glucose-Capillary 12/01/2021 135 (H)  70 - 99 mg/dL Final   Glucose reference range applies only to samples taken after fasting for at least 8 hours.   Glucose-Capillary 12/02/2021 198 (H)  70 - 99 mg/dL Final   Glucose reference range applies only to samples taken after fasting for at least 8 hours.   Glucose-Capillary 12/02/2021  79  70 - 99 mg/dL Final   Glucose reference range applies only to samples taken after fasting for at least 8 hours.   Glucose-Capillary 12/02/2021 159 (H)  70 - 99 mg/dL Final   Glucose reference range applies only to samples taken after fasting for at least 8 hours.   Glucose-Capillary 12/02/2021 284 (H)  70 - 99 mg/dL Final   Glucose reference range applies only to samples taken after fasting for at least 8 hours.   Glucose-Capillary 12/03/2021 220 (H)  70 - 99 mg/dL Final   Glucose reference  range applies only to samples taken after fasting for at least 8 hours.   Clozapine Lvl 12/03/2021 87 (L)  350 - 600 ng/mL Final   Comment: (NOTE) This test was developed and its performance characteristics determined by [facility]. It has not been cleared or approved by the Food and Drug Administration.    NorClozapine 12/03/2021 44  Not Estab. ng/mL Final   Comment: (NOTE) This test was developed and its performance characteristics determined by [facility]. It has not been cleared or approved by the Food and Drug Administration.    Total(Cloz+Norcloz) 12/03/2021 131  ng/mL Final   Comment: (NOTE) Plasma concentrations of clozapine plus norclozapine (combined total) greater than 450 ng/mL have been associated with therapeutic effect.                                Detection Limit = 20 Performed At: [facility] [address] [place], [place] [id number] Rush Farmer MD JG:[id number]    Valproic Acid Lvl 12/03/2021 54  50.0 - 100.0 ug/mL Final   Performed at [facility], [place] [address] [place], [place] [address]   Glucose-Capillary 12/03/2021 104 (H)  70 - 99 mg/dL Final   Glucose reference range applies only to samples taken after fasting for at least 8 hours.   Glucose-Capillary 12/03/2021 81  70 - 99 mg/dL Final   Glucose reference range applies only to samples taken after fasting for at least 8 hours.   Glucose-Capillary 12/03/2021 185 (H)  70 - 99 mg/dL Final   Glucose reference range applies only to samples taken after fasting for at least 8 hours.   Glucose-Capillary 12/04/2021 99  70 - 99 mg/dL Final   Glucose reference range applies only to samples taken after fasting for at least 8 hours.   Glucose-Capillary 12/04/2021 217 (H)  70 - 99 mg/dL Final   Glucose reference range applies only to samples taken after fasting for at least 8 hours.   WBC 12/04/2021 6.5  4.0 - 10.5 K/uL Final   RBC 12/04/2021 4.51  3.87 - 5.11 MIL/uL Final   Hemoglobin 12/04/2021 11.5  (L)  12.0 - 15.0 g/dL Final   HCT 12/04/2021 36.7  36.0 - 46.0 % Final   MCV 12/04/2021 81.4  80.0 - 100.0 fL Final   MCH 12/04/2021 25.5 (L)  26.0 - 34.0 pg Final   MCHC 12/04/2021 31.3  30.0 - 36.0 g/dL Final   RDW 12/04/2021 17.3 (H)  11.5 - 15.5 % Final   Platelets 12/04/2021 254  150 - 400 K/uL Final   REPEATED TO VERIFY   nRBC 12/04/2021 0.0  0.0 - 0.2 % Final   Neutrophils Relative % 12/04/2021 60  % Final   Neutro Abs 12/04/2021 3.8  1.7 - 7.7 K/uL Final   Lymphocytes Relative 12/04/2021 29  % Final   Lymphs Abs 12/04/2021  1.9  0.7 - 4.0 K/uL Final   Monocytes Relative 12/04/2021 9  % Final   Monocytes Absolute 12/04/2021 0.6  0.1 - 1.0 K/uL Final   Eosinophils Relative 12/04/2021 1  % Final   Eosinophils Absolute 12/04/2021 0.1  0.0 - 0.5 K/uL Final   Basophils Relative 12/04/2021 1  % Final   Basophils Absolute 12/04/2021 0.1  0.0 - 0.1 K/uL Final   Immature Granulocytes 12/04/2021 0  % Final   Abs Immature Granulocytes 12/04/2021 0.02  0.00 - 0.07 K/uL Final   Performed at [facility], [place] [address], [place], [place] [address]   Glucose-Capillary 12/04/2021 177 (H)  70 - 99 mg/dL Final   Glucose reference range applies only to samples taken after fasting for at least 8 hours.   Glucose-Capillary 12/04/2021 206 (H)  70 - 99 mg/dL Final   Glucose reference range applies only to samples taken after fasting for at least 8 hours.   Glucose-Capillary 12/05/2021 130 (H)  70 - 99 mg/dL Final   Glucose reference range applies only to samples taken after fasting for at least 8 hours.   Glucose-Capillary 12/05/2021 159 (H)  70 - 99 mg/dL Final   Glucose reference range applies only to samples taken after fasting for at least 8 hours.   Color, Urine 12/05/2021 STRAW (A)  YELLOW Final   APPearance 12/05/2021 CLEAR  CLEAR Final   Specific Gravity, Urine 12/05/2021 1.008  1.005 - 1.030 Final   pH 12/05/2021 5.0  5.0 - 8.0 Final   Glucose, UA 12/05/2021 NEGATIVE  NEGATIVE mg/dL  Final   Hgb urine dipstick 12/05/2021 NEGATIVE  NEGATIVE Final   Bilirubin Urine 12/05/2021 NEGATIVE  NEGATIVE Final   Ketones, ur 12/05/2021 NEGATIVE  NEGATIVE mg/dL Final   Protein, ur 12/05/2021 NEGATIVE  NEGATIVE mg/dL Final   Nitrite 12/05/2021 NEGATIVE  NEGATIVE Final   Leukocytes,Ua 12/05/2021 NEGATIVE  NEGATIVE Final   Performed at [facility], [place] [address], [place], [place] [address]   Glucose-Capillary 12/05/2021 152 (H)  70 - 99 mg/dL Final   Glucose reference range applies only to samples taken after fasting for at least 8 hours.   Glucose-Capillary 12/06/2021 122 (H)  70 - 99 mg/dL Final   Glucose reference range applies only to samples taken after fasting for at least 8 hours.   Glucose-Capillary 12/06/2021 113 (H)  70 - 99 mg/dL Final   Glucose reference range applies only to samples taken after fasting for at least 8 hours.   Glucose-Capillary 12/06/2021 181 (H)  70 - 99 mg/dL Final   Glucose reference range applies only to samples taken after fasting for at least 8 hours.   Glucose-Capillary 12/07/2021 110 (H)  70 - 99 mg/dL Final   Glucose reference range applies only to samples taken after fasting for at least 8 hours.   Glucose-Capillary 12/05/2021 171 (H)  70 - 99 mg/dL Final   Glucose reference range applies only to samples taken after fasting for at least 8 hours.   Glucose-Capillary 12/06/2021 193 (H)  70 - 99 mg/dL Final   Glucose reference range applies only to samples taken after fasting for at least 8 hours.   Glucose-Capillary 12/07/2021 102 (H)  70 - 99 mg/dL Final   Glucose reference range applies only to samples taken after fasting for at least 8 hours.   Glucose-Capillary 12/07/2021 129 (H)  70 - 99 mg/dL Final   Glucose reference range applies only to samples taken after fasting  for at least 8 hours.   Glucose-Capillary 12/07/2021 145 (H)  70 - 99 mg/dL Final   Glucose reference range applies only to samples taken after fasting for at least 8  hours.   Glucose-Capillary 12/08/2021 107 (H)  70 - 99 mg/dL Final   Glucose reference range applies only to samples taken after fasting for at least 8 hours.   Glucose-Capillary 12/08/2021 179 (H)  70 - 99 mg/dL Final   Glucose reference range applies only to samples taken after fasting for at least 8 hours.   Glucose-Capillary 12/08/2021 145 (H)  70 - 99 mg/dL Final   Glucose reference range applies only to samples taken after fasting for at least 8 hours.  Admission on 11/22/2021, Discharged on 11/22/2021  Component Date Value Ref Range Status   Glucose-Capillary 11/22/2021 169 (H)  70 - 99 mg/dL Final   Glucose reference range applies only to samples taken after fasting for at least 8 hours.  No results displayed because visit has over 200 results.    Admission on 10/04/2021, Discharged on 10/22/2021  Component Date Value Ref Range Status   SARS Coronavirus 2 by RT PCR 10/04/2021 NEGATIVE  NEGATIVE Final   Comment: (NOTE) SARS-CoV-2 target nucleic acids are NOT DETECTED.  The SARS-CoV-2 RNA is generally detectable in upper respiratory specimens during the acute phase of infection. The lowest concentration of SARS-CoV-2 viral copies this assay can detect is 138 copies/mL. A negative result does not preclude SARS-Cov-2 infection and should not be used as the sole basis for treatment or other patient management decisions. A negative result may occur with  improper specimen collection/handling, submission of specimen other than nasopharyngeal swab, presence of viral mutation(s) within the areas targeted by this assay, and inadequate number of viral copies(<138 copies/mL). A negative result must be combined with clinical observations, patient history, and epidemiological information. The expected result is Negative.  Fact Sheet for Patients:  EntrepreneurPulse.com.au  Fact Sheet for Healthcare Providers:  IncredibleEmployment.be  This test is no                           t yet approved or cleared by the [place] FDA and  has been authorized for detection and/or diagnosis of SARS-CoV-2 by FDA under an Emergency Use Authorization (EUA). This EUA will remain  in effect (meaning this test can be used) for the duration of the COVID-19 declaration under Section 564(b)(1) of the Act, 21 U.S.C.section 360bbb-3(b)(1), unless the authorization is terminated  or revoked sooner.       Influenza A by PCR 10/04/2021 NEGATIVE  NEGATIVE Final   Influenza B by PCR 10/04/2021 NEGATIVE  NEGATIVE Final   Comment: (NOTE) The Xpert Xpress SARS-CoV-2/FLU/RSV plus assay is intended as an aid in the diagnosis of influenza from Nasopharyngeal swab specimens and should not be used as a sole basis for treatment. Nasal washings and aspirates are unacceptable for Xpert Xpress SARS-CoV-2/FLU/RSV testing.  Fact Sheet for Patients: EntrepreneurPulse.com.au  Fact Sheet for Healthcare Providers: IncredibleEmployment.be  This test is not yet approved or cleared by the [place] FDA and has been authorized for detection and/or diagnosis of SARS-CoV-2 by FDA under an Emergency Use Authorization (EUA). This EUA will remain in effect (meaning this test can be used) for the duration of the COVID-19 declaration under Section 564(b)(1) of the Act, 21 U.S.C. section 360bbb-3(b)(1), unless the authorization is terminated or revoked.  Performed at [facility], [place]  [address], [place], [place] [address]    WBC 10/04/2021 8.4  4.0 - 10.5 K/uL Final   RBC 10/04/2021 4.42  3.87 - 5.11 MIL/uL Final   Hemoglobin 10/04/2021 11.6 (L)  12.0 - 15.0 g/dL Final   HCT 10/04/2021 35.7 (L)  36.0 - 46.0 % Final   MCV 10/04/2021 80.8  80.0 - 100.0 fL Final   MCH 10/04/2021 26.2  26.0 - 34.0 pg Final   MCHC 10/04/2021 32.5  30.0 - 36.0 g/dL Final   RDW 10/04/2021 16.0 (H)  11.5 - 15.5 % Final   Platelets 10/04/2021 372  150 -  400 K/uL Final   nRBC 10/04/2021 0.0  0.0 - 0.2 % Final   Neutrophils Relative % 10/04/2021 68  % Final   Neutro Abs 10/04/2021 5.7  1.7 - 7.7 K/uL Final   Lymphocytes Relative 10/04/2021 22  % Final   Lymphs Abs 10/04/2021 1.8  0.7 - 4.0 K/uL Final   Monocytes Relative 10/04/2021 8  % Final   Monocytes Absolute 10/04/2021 0.7  0.1 - 1.0 K/uL Final   Eosinophils Relative 10/04/2021 1  % Final   Eosinophils Absolute 10/04/2021 0.1  0.0 - 0.5 K/uL Final   Basophils Relative 10/04/2021 1  % Final   Basophils Absolute 10/04/2021 0.1  0.0 - 0.1 K/uL Final   Immature Granulocytes 10/04/2021 0  % Final   Abs Immature Granulocytes 10/04/2021 0.03  0.00 - 0.07 K/uL Final   Performed at [facility], [place] [address], [place], [place] [address]   Sodium 10/04/2021 136  135 - 145 mmol/L Final   Potassium 10/04/2021 4.2  3.5 - 5.1 mmol/L Final   Chloride 10/04/2021 104  98 - 111 mmol/L Final   CO2 10/04/2021 25  22 - 32 mmol/L Final   Glucose, Bld 10/04/2021 100 (H)  70 - 99 mg/dL Final   Glucose reference range applies only to samples taken after fasting for at least 8 hours.   BUN 10/04/2021 9  6 - 20 mg/dL Final   Creatinine, Ser 10/04/2021 0.52  0.44 - 1.00 mg/dL Final   Calcium 10/04/2021 9.0  8.9 - 10.3 mg/dL Final   Total Protein 10/04/2021 7.0  6.5 - 8.1 g/dL Final   Albumin 10/04/2021 3.2 (L)  3.5 - 5.0 g/dL Final   AST 10/04/2021 13 (L)  15 - 41 U/L Final   ALT 10/04/2021 10  0 - 44 U/L Final   Alkaline Phosphatase 10/04/2021 61  38 - 126 U/L Final   Total Bilirubin 10/04/2021 0.3  0.3 - 1.2 mg/dL Final   GFR, Estimated 10/04/2021 >60  >60 mL/min Final   Comment: (NOTE) Calculated using the CKD-EPI Creatinine Equation (2021)    Anion gap 10/04/2021 7  5 - 15 Final   Performed at [place] [address] [place], [place] [address]   Hgb A1c MFr Bld 10/04/2021 7.0 (H)  4.8 - 5.6 % Final   Comment: (NOTE) Pre diabetes:          5.7%-6.4%  Diabetes:               >6.4%  Glycemic control for   <7.0% adults with diabetes    Mean Plasma Glucose 10/04/2021 154.2  mg/dL Final   Performed at [facility], [place] [address], [place], [place] [address]   Cholesterol 10/04/2021 178  0 - 200 mg/dL Final   Triglycerides 10/04/2021 155 (H)  <150 mg/dL Final   HDL 10/04/2021 52  >  40 mg/dL Final   Total CHOL/HDL Ratio 10/04/2021 3.4  RATIO Final   VLDL 10/04/2021 31  0 - 40 mg/dL Final   LDL Cholesterol 10/04/2021 95  0 - 99 mg/dL Final   Comment:        Total Cholesterol/HDL:CHD Risk Coronary Heart Disease Risk Table                     Men   Women  1/2 Average Risk   3.4   3.3  Average Risk       5.0   4.4  2 X Average Risk   9.6   7.1  3 X Average Risk  23.4   11.0        Use the calculated Patient Ratio above and the CHD Risk Table to determine the patient's CHD Risk.        ATP III CLASSIFICATION (LDL):  <100     mg/dL   Optimal  100-129  mg/dL   Near or Above                    Optimal  130-159  mg/dL   Borderline  160-189  mg/dL   High  >190     mg/dL   Very High Performed at [place] [address] [place], [place] [address]    POC Amphetamine UR 10/04/2021 None Detected  NONE DETECTED (Cut Off Level 1000 ng/mL) Final   POC Secobarbital (BAR) 10/04/2021 None Detected  NONE DETECTED (Cut Off Level 300 ng/mL) Final   POC Buprenorphine (BUP) 10/04/2021 None Detected  NONE DETECTED (Cut Off Level 10 ng/mL) Final   POC Oxazepam (BZO) 10/04/2021 None Detected  NONE DETECTED (Cut Off Level 300 ng/mL) Final   POC Cocaine UR 10/04/2021 None Detected  NONE DETECTED (Cut Off Level 300 ng/mL) Final   POC Methamphetamine UR 10/04/2021 None Detected  NONE DETECTED (Cut Off Level 1000 ng/mL) Final   POC Morphine 10/04/2021 None Detected  NONE DETECTED (Cut Off Level 300 ng/mL) Final   POC Methadone UR 10/04/2021 None Detected  NONE DETECTED (Cut Off Level 300 ng/mL) Final   POC Oxycodone UR 10/04/2021 Positive (A)  NONE DETECTED (Cut Off  Level 100 ng/mL) Final   POC Marijuana UR 10/04/2021 None Detected  NONE DETECTED (Cut Off Level 50 ng/mL) Final   SARSCOV2ONAVIRUS 2 AG 10/04/2021 NEGATIVE  NEGATIVE Final   Comment: (NOTE) SARS-CoV-2 antigen NOT DETECTED.   Negative results are presumptive.  Negative results do not preclude SARS-CoV-2 infection and should not be used as the sole basis for treatment or other patient management decisions, including infection  control decisions, particularly in the presence of clinical signs and  symptoms consistent with COVID-19, or in those who have been in contact with the virus.  Negative results must be combined with clinical observations, patient history, and epidemiological information. The expected result is Negative.  Fact Sheet for Patients: HandmadeRecipes.com.cy  Fact Sheet for Healthcare Providers: FuneralLife.at  This test is not yet approved or cleared by the [place] FDA and  has been authorized for detection and/or diagnosis of SARS-CoV-2 by FDA under an Emergency Use Authorization (EUA).  This EUA will remain in effect (meaning this test can be used) for the duration of  the COV                          ID-19 declaration under Section 564(b)(1) of the Act,  21 U.S.C. section 360bbb-3(b)(1), unless the authorization is terminated or revoked sooner.     Valproic Acid Lvl 10/04/2021 51  50.0 - 100.0 ug/mL Final   Performed at [place] [address] [place], [place] [address]   Valproic Acid Lvl 10/08/2021 57  50.0 - 100.0 ug/mL Final   Performed at [facility] [address], [place], [place] [address]   Glucose-Capillary 10/09/2021 123 (H)  70 - 99 mg/dL Final   Glucose reference range applies only to samples taken after fasting for at least 8 hours.  Admission on 09/09/2021, Discharged on 09/10/2021  Component Date Value Ref Range Status   Sodium 09/09/2021 134 (L)  135 - 145 mmol/L Final    Potassium 09/09/2021 4.3  3.5 - 5.1 mmol/L Final   Chloride 09/09/2021 99  98 - 111 mmol/L Final   CO2 09/09/2021 25  22 - 32 mmol/L Final   Glucose, Bld 09/09/2021 107 (H)  70 - 99 mg/dL Final   Glucose reference range applies only to samples taken after fasting for at least 8 hours.   BUN 09/09/2021 12  6 - 20 mg/dL Final   Creatinine, Ser 09/09/2021 0.74  0.44 - 1.00 mg/dL Final   Calcium 09/09/2021 9.0  8.9 - 10.3 mg/dL Final   Total Protein 09/09/2021 7.4  6.5 - 8.1 g/dL Final   Albumin 09/09/2021 3.1 (L)  3.5 - 5.0 g/dL Final   AST 09/09/2021 13 (L)  15 - 41 U/L Final   ALT 09/09/2021 13  0 - 44 U/L Final   Alkaline Phosphatase 09/09/2021 66  38 - 126 U/L Final   Total Bilirubin 09/09/2021 0.2 (L)  0.3 - 1.2 mg/dL Final   GFR, Estimated 09/09/2021 >60  >60 mL/min Final   Comment: (NOTE) Calculated using the CKD-EPI Creatinine Equation (2021)    Anion gap 09/09/2021 10  5 - 15 Final   Performed at [facility], [place] [address] [place], [place] [address]   WBC 09/09/2021 8.5  4.0 - 10.5 K/uL Final   RBC 09/09/2021 4.53  3.87 - 5.11 MIL/uL Final   Hemoglobin 09/09/2021 11.8 (L)  12.0 - 15.0 g/dL Final   HCT 09/09/2021 38.0  36.0 - 46.0 % Final   MCV 09/09/2021 83.9  80.0 - 100.0 fL Final   MCH 09/09/2021 26.0  26.0 - 34.0 pg Final   MCHC 09/09/2021 31.1  30.0 - 36.0 g/dL Final   RDW 09/09/2021 17.8 (H)  11.5 - 15.5 % Final   Platelets 09/09/2021 442 (H)  150 - 400 K/uL Final   nRBC 09/09/2021 0.0  0.0 - 0.2 % Final   Neutrophils Relative % 09/09/2021 70  % Final   Neutro Abs 09/09/2021 5.9  1.7 - 7.7 K/uL Final   Lymphocytes Relative 09/09/2021 20  % Final   Lymphs Abs 09/09/2021 1.7  0.7 - 4.0 K/uL Final   Monocytes Relative 09/09/2021 8  % Final   Monocytes Absolute 09/09/2021 0.7  0.1 - 1.0 K/uL Final   Eosinophils Relative 09/09/2021 1  % Final   Eosinophils Absolute 09/09/2021 0.1  0.0 - 0.5 K/uL Final   Basophils Relative 09/09/2021 1  % Final   Basophils  Absolute 09/09/2021 0.1  0.0 - 0.1 K/uL Final   Immature Granulocytes 09/09/2021 0  % Final   Abs Immature Granulocytes 09/09/2021 0.03  0.00 - 0.07 K/uL Final   Performed at [facility], [place] [address] [place], [place] [address]  Ammonia 09/09/2021 37 (H)  9 - 35 umol/L Final   Comment: HEMOLYSIS AT THIS LEVEL MAY AFFECT RESULT Performed at [facility], [place] [address], [place], [place] [address]    Opiates 09/09/2021 NONE DETECTED  NONE DETECTED Final   Cocaine 09/09/2021 NONE DETECTED  NONE DETECTED Final   Benzodiazepines 09/09/2021 NONE DETECTED  NONE DETECTED Final   Amphetamines 09/09/2021 NONE DETECTED  NONE DETECTED Final   Tetrahydrocannabinol 09/09/2021 NONE DETECTED  NONE DETECTED Final   Barbiturates 09/09/2021 NONE DETECTED  NONE DETECTED Final   Comment: (NOTE) DRUG SCREEN FOR MEDICAL PURPOSES ONLY.  IF CONFIRMATION IS NEEDED FOR ANY PURPOSE, NOTIFY LAB WITHIN 5 DAYS.  LOWEST DETECTABLE LIMITS FOR URINE DRUG SCREEN Drug Class                     Cutoff (ng/mL) Amphetamine and metabolites    1000 Barbiturate and metabolites    200 Benzodiazepine                 076 Tricyclics and metabolites     300 Opiates and metabolites        300 Cocaine and metabolites        300 THC                            50 Performed at [facility], [place] [address], [place], [place] [address]    Alcohol, Ethyl (B) 09/09/2021 <10  <10 mg/dL Final   Comment: (NOTE) Lowest detectable limit for serum alcohol is 10 mg/dL.  For medical purposes only. Performed at [facility], [place] [address] [place], [place] [address]    Lipase 09/09/2021 33  11 - 51 U/L Final   Performed at [facility] [address], [place], [place] [address]   Color, Urine 09/09/2021 COLORLESS (A)  YELLOW Final   APPearance 09/09/2021 CLEAR  CLEAR Final   Specific Gravity, Urine 09/09/2021 1.002 (L)  1.005 - 1.030 Final   pH 09/09/2021 6.0  5.0 - 8.0 Final   Glucose, UA  09/09/2021 NEGATIVE  NEGATIVE mg/dL Final   Hgb urine dipstick 09/09/2021 NEGATIVE  NEGATIVE Final   Bilirubin Urine 09/09/2021 NEGATIVE  NEGATIVE Final   Ketones, ur 09/09/2021 NEGATIVE  NEGATIVE mg/dL Final   Protein, ur 09/09/2021 NEGATIVE  NEGATIVE mg/dL Final   Nitrite 09/09/2021 NEGATIVE  NEGATIVE Final   Leukocytes,Ua 09/09/2021 NEGATIVE  NEGATIVE Final   Performed at [facility] [address], [place], [place] [address]   Specimen Description 09/09/2021 URINE, CLEAN CATCH   Final   Special Requests 09/09/2021 NONE   Final   Culture 09/09/2021  (A)   Final                   Value:<10,000 COLONIES/mL INSIGNIFICANT GROWTH Performed at [facility], [place] [address] [place], [place] [address]    Report Status 09/09/2021 09/10/2021 FINAL   Final   D-Dimer, Quant 09/09/2021 0.92 (H)  0.00 - 0.50 ug/mL-FEU Final   Comment: (NOTE) At the manufacturer cut-off value of 0.5 g/mL FEU, this assay has a negative predictive value of 95-100%.This assay is intended for use in conjunction with a clinical pretest probability (PTP) assessment model to exclude pulmonary embolism (PE) and deep venous thrombosis (DVT) in outpatients suspected of PE or DVT. Results should be correlated with clinical presentation. Performed at [facility],  [address], [place], [place] [address]    Troponin I (High Sensitivity) 09/09/2021 5  <18 ng/L Final   Comment: (NOTE) Elevated high sensitivity troponin I (hsTnI) values and significant  changes across serial measurements may suggest ACS but many other  chronic and acute conditions are known to elevate hsTnI results.  Refer to the "Links" section for chest pain algorithms and additional  guidance. Performed at [facility], [place] [address], [place], [place] [address]    BP 09/10/2021 135/83  mmHg Final   S' Lateral 09/10/2021 2.30  cm Final   Area-P 1/2 09/10/2021 5.58  cm2 Final    Blood Alcohol level:  Lab Results   Component Value Date   ETH <10 11/23/2021   ETH <10 83/15/1761    Metabolic Disorder Labs: Lab Results  Component Value Date   HGBA1C 7.0 (H) 10/04/2021   MPG 154.2 10/04/2021   Lab Results  Component Value Date   PROLACTIN 3.8 (L) 11/23/2021   Lab Results  Component Value Date   CHOL 171 11/23/2021   TRIG 125 11/23/2021   HDL 65 11/23/2021   CHOLHDL 2.6 11/23/2021   VLDL 25 11/23/2021   LDLCALC 81 11/23/2021   LDLCALC 95 10/04/2021    Therapeutic Lab Levels: No results found for: "LITHIUM" Lab Results  Component Value Date   VALPROATE 54 12/03/2021   VALPROATE 45 (L) 11/09/2021   No results found for: "CBMZ"  Physical Findings   PHQ2-9    Flowsheet Row ED from 11/23/2021 in [facility]  PHQ-2 Total Score 2  PHQ-9 Total Score 3      Flowsheet Row ED from 11/23/2021 in [facility] Most recent reading at 11/23/2021  6:44 PM ED from 11/22/2021 in [facility] Most recent reading at 11/22/2021  8:28 PM ED from 11/22/2021 in [facility] Most recent reading at 11/22/2021  1:24 AM  C-SSRS RISK CATEGORY No Risk No Risk No Risk        Musculoskeletal  Strength & Muscle Tone: within normal limits Gait & Station: normal Patient leans: N/A  Psychiatric Specialty Exam  Presentation  General Appearance:  Appropriate for Environment; Casual  Eye Contact: Good  Speech: Clear and Coherent; Normal Rate  Speech Volume: Normal  Handedness: Right   Mood and Affect  Mood: Euthymic  Affect: Congruent   Thought Process  Thought Processes: Coherent  Descriptions of Associations:Intact  Orientation:Full (Time, Place and Person)  Thought Content:Logical  Diagnosis of Schizophrenia or Schizoaffective disorder in past: Yes  Duration of Psychotic Symptoms: No data recorded  Hallucinations:Hallucinations: None  Ideas of  Reference:None  Suicidal Thoughts:Suicidal Thoughts: No  Homicidal Thoughts:Homicidal Thoughts: No   Sensorium  Memory: Immediate Good; Remote Good; Recent Good  Judgment: Fair  Insight: Fair   Community education officer  Concentration: Good  Attention Span: Good  Recall: Good  Fund of Knowledge: Good  Language: Good   Psychomotor Activity  Psychomotor Activity: Psychomotor Activity: Normal   Assets  Assets: Physical Health; Resilience; Social Support; Armed forces logistics/support/administrative officer; Desire for Improvement   Sleep  Sleep: Sleep: Good   Nutritional Assessment (For OBS and FBC admissions only) Has the patient had a weight loss or gain of 10 pounds or more in the last 3 months?: No Has the patient had a decrease in food intake/or appetite?: No Does the patient have dental problems?: No Does the patient have eating habits or behaviors that may be indicators  of an eating disorder including binging or inducing vomiting?: No Has the patient recently lost weight without trying?: 0 Has the patient been eating poorly because of a decreased appetite?: 0 Malnutrition Screening Tool Score: 0    Physical Exam  Physical Exam Vitals and nursing note reviewed.  Constitutional:      General: She is not in acute distress.    Appearance: Normal appearance. She is not ill-appearing.  HENT:     Head: Normocephalic.  Eyes:     General:        Right eye: No discharge.        Left eye: No discharge.     Conjunctiva/sclera: Conjunctivae normal.     Pupils: Pupils are equal, round, and reactive to light.  Cardiovascular:     Rate and Rhythm: Normal rate.  Pulmonary:     Effort: Pulmonary effort is normal.  Musculoskeletal:        General: Normal range of motion.     Cervical back: Normal range of motion.  Skin:    General: Skin is warm and dry.     Coloration: Skin is not jaundiced or pale.  Neurological:     Mental Status: She is alert and oriented to person, place, and  time.  Psychiatric:        Attention and Perception: Attention and perception normal.        Mood and Affect: Mood and affect normal.        Speech: Speech normal.        Behavior: Behavior normal. Behavior is cooperative.        Thought Content: Thought content normal.        Cognition and Memory: Cognition normal.        Judgment: Judgment normal.    Review of Systems  Constitutional: Negative.   HENT: Negative.    Eyes: Negative.   Respiratory: Negative.    Cardiovascular: Negative.   Musculoskeletal: Negative.   Skin: Negative.   Neurological: Negative.   Psychiatric/Behavioral: Negative.     Blood pressure (!) 114/56, pulse 100, temperature 98.4 F (36.9 C), temperature source Oral, resp. rate 20, SpO2 95 %. There is no height or weight on file to calculate BMI.  Treatment Plan Summary: Disposition: Patient has been psychiatrically cleared and does not meet criteria for inpatient treatment.  We will continue to have daily contact with patient.  DSS continues to seek placement. Revonda Humphrey, NP 12/08/2021 7:09 PM

## 2021-12-08 NOTE — ED Notes (Signed)
Patient observed/assessed in bed/chair resting quietly appearing with no distress and verbalizing no complaints at this time. Will continue to monitor.  

## 2021-12-09 DIAGNOSIS — F209 Schizophrenia, unspecified: Secondary | ICD-10-CM | POA: Diagnosis not present

## 2021-12-09 LAB — GLUCOSE, CAPILLARY
Glucose-Capillary: 100 mg/dL — ABNORMAL HIGH (ref 70–99)
Glucose-Capillary: 194 mg/dL — ABNORMAL HIGH (ref 70–99)
Glucose-Capillary: 74 mg/dL (ref 70–99)
Glucose-Capillary: 142 mg/dL — ABNORMAL HIGH (ref 70–99)

## 2021-12-09 NOTE — ED Notes (Signed)
Pt sleeping@this time. Breathing even and unlabored. Will continue to monitor for safety 

## 2021-12-09 NOTE — ED Notes (Signed)
Pt is in the bed sleeping. Respirations are even and unlabored. No acute distress noted. Will continue to monitor for safety. 

## 2021-12-09 NOTE — ED Provider Notes (Signed)
Teresa Murillo is a 61 y/o female with a past psychiatric history of schizophrenia, aggressive behavior, and possible intellectual disability presenting to [facility] on 11/23/21 voluntarily as a walk in via [facility] with complaints that she was locked out of the group home. Pt had already been discharged from this facility but had been living there temporarily while DSS was looking for new placement. Pt is boarding at [facility].  On reassessment, pt reports she is "alright". She states she is "waiting, trying to find a place to stay". She states she cannot return to her group home. She denies suicidal, homicidal, or violent ideations. She denies auditory visual hallucinations or paranoia. She is pleasant, cooperative. There is no evidence of agitation, aggression. No delusions or paranoia elicited. Pt reports she has been tolerating her medications without adverse effects. She has been appropriate with staff and pts on the unit. There have been no behavioral outbursts, and she has received no as needed medications for agitation.

## 2021-12-09 NOTE — ED Notes (Signed)
Pt A&O x 4, watching TV at present,  Monitoring for safety.

## 2021-12-09 NOTE — ED Notes (Signed)
[name].  Reports no pain, SI, HI, or AVH.  Breathing is even and unlabored.  Will continue to monitor for safety.

## 2021-12-10 DIAGNOSIS — F209 Schizophrenia, unspecified: Secondary | ICD-10-CM | POA: Diagnosis not present

## 2021-12-10 LAB — GLUCOSE, CAPILLARY
Glucose-Capillary: 296 mg/dL — ABNORMAL HIGH (ref 70–99)
Glucose-Capillary: 74 mg/dL (ref 70–99)
Glucose-Capillary: 157 mg/dL — ABNORMAL HIGH (ref 70–99)
Glucose-Capillary: 210 mg/dL — ABNORMAL HIGH (ref 70–99)

## 2021-12-10 MED ORDER — INSULIN ASPART 100 UNIT/ML IJ SOLN
0.0000 [IU] | Freq: Three times a day (TID) | INTRAMUSCULAR | Status: DC
  Administered 2021-12-10: 2 [IU] via SUBCUTANEOUS
  Administered 2021-12-11 (×2): 1 [IU] via SUBCUTANEOUS
  Administered 2021-12-12 (×2): 2 [IU] via SUBCUTANEOUS
  Administered 2021-12-13 – 2021-12-15 (×2): 1 [IU] via SUBCUTANEOUS
  Administered 2021-12-16 – 2021-12-17 (×2): 2 [IU] via SUBCUTANEOUS
  Administered 2021-12-18: 1 [IU] via SUBCUTANEOUS
  Administered 2021-12-19: 2 [IU] via SUBCUTANEOUS
  Administered 2021-12-19: 1 [IU] via SUBCUTANEOUS
  Administered 2021-12-20 – 2021-12-21 (×3): 2 [IU] via SUBCUTANEOUS
  Administered 2021-12-22 (×2): 1 [IU] via SUBCUTANEOUS
  Administered 2021-12-23: 2 [IU] via SUBCUTANEOUS
  Administered 2021-12-23 – 2021-12-26 (×2): 1 [IU] via SUBCUTANEOUS
  Administered 2021-12-26: 2 [IU] via SUBCUTANEOUS
  Administered 2021-12-27: 1 [IU] via SUBCUTANEOUS
  Administered 2021-12-27: 2 [IU] via SUBCUTANEOUS
  Administered 2021-12-27 – 2021-12-28 (×2): 3 [IU] via SUBCUTANEOUS
  Administered 2021-12-29 – 2021-12-30 (×3): 1 [IU] via SUBCUTANEOUS
  Administered 2021-12-31 (×2): 2 [IU] via SUBCUTANEOUS
  Administered 2022-01-02: 1 [IU] via SUBCUTANEOUS
  Administered 2022-01-02: 2 [IU] via SUBCUTANEOUS
  Administered 2022-01-04: 1 [IU] via SUBCUTANEOUS
  Administered 2022-01-04: 2 [IU] via SUBCUTANEOUS
  Administered 2022-01-05: 1 [IU] via SUBCUTANEOUS
  Administered 2022-01-06 (×2): 2 [IU] via SUBCUTANEOUS
  Administered 2022-01-07: 1 [IU] via SUBCUTANEOUS
  Administered 2022-01-08: 3 [IU] via SUBCUTANEOUS
  Administered 2022-01-09 (×3): 1 [IU] via SUBCUTANEOUS
  Administered 2022-01-09: 2 [IU] via SUBCUTANEOUS
  Administered 2022-01-10 – 2022-01-11 (×3): 1 [IU] via SUBCUTANEOUS
  Administered 2022-01-12: 3 [IU] via SUBCUTANEOUS
  Administered 2022-01-13: 1 [IU] via SUBCUTANEOUS
  Administered 2022-01-13: 2 [IU] via SUBCUTANEOUS
  Administered 2022-01-14 – 2022-01-15 (×2): 1 [IU] via SUBCUTANEOUS
  Administered 2022-01-15: 3 [IU] via SUBCUTANEOUS
  Administered 2022-01-16: 2 [IU] via SUBCUTANEOUS
  Administered 2022-01-16: 3 [IU] via SUBCUTANEOUS
  Administered 2022-01-17 – 2022-01-18 (×3): 2 [IU] via SUBCUTANEOUS
  Administered 2022-01-20: 1 [IU] via SUBCUTANEOUS
  Administered 2022-01-20: 5 [IU] via SUBCUTANEOUS
  Administered 2022-01-20 – 2022-01-21 (×2): 1 [IU] via SUBCUTANEOUS
  Administered 2022-01-22: 2 [IU] via SUBCUTANEOUS
  Administered 2022-01-22 – 2022-01-23 (×2): 1 [IU] via SUBCUTANEOUS
  Administered 2022-01-23: 2 [IU] via SUBCUTANEOUS

## 2021-12-10 NOTE — ED Notes (Signed)
Pt sleeping@this time. Breathing even and unlabored. Will continue to monitor for safety 

## 2021-12-10 NOTE — ED Notes (Signed)
Pt was given dinner. 

## 2021-12-10 NOTE — ED Notes (Signed)
Pt resting in bed. A&O x4, calm and cooperative. Denies current SI/HI/AVH. No signs of distress noted. Monitoring for safety. 

## 2021-12-10 NOTE — Care Management (Signed)
Care Management   Completed Treatment Team Meeting with the following participants:  Samule Ohm - APS Worker Donnella Sham - Supervisor APS   [place] Case Arts administrator for [facility]    Dr. Dwyane Dee - Medical Director, [place], [place]  Per Carrier Mills with [facility] the patient is currently under review at [facility].   Per Marita Kansas CM with [facility], she has reached out to over 106 facilities.  However, due to the patient limited funding she is not being considered for placement in a lot of facilities.  Per Marita Kansas with [facility], she is not able to give an estimated time of when the patient will be safely discharged.   Per Rosendo Gros with APS, the she has 30-days to complete the assessment and make a determine if a patient needs a legal guardian.    Per Rosendo Gros, even if the patient does meet criteria for guardianship, assigning guardianship is a process and the next step would be a court date to determine who would take legal guardianship of the patient.

## 2021-12-10 NOTE — Inpatient Diabetes Management (Signed)
Inpatient Diabetes Program Recommendations  AACE/ADA: New Consensus Statement on Inpatient Glycemic Control  Target Ranges:  Prepandial:   less than 140 mg/dL      Peak postprandial:   less than 180 mg/dL (1-2 hours)      Critically ill patients:  140 - 180 mg/dL    Latest Reference Range & Units 12/10/21 08:00 12/10/21 11:31  Glucose-Capillary 70 - 99 mg/dL 296 (H) 74    Latest Reference Range & Units 12/09/21 07:42 12/09/21 11:25 12/09/21 17:08 12/09/21 21:19  Glucose-Capillary 70 - 99 mg/dL 100 (H) 194 (H) 74 142 (H)   Review of Glycemic Control  Diabetes history: DM2 Outpatient Diabetes medications: Lantus 12 units daily, Novolog 2-10 units TID with meals, Metformin 500 mg BID Current orders for Inpatient glycemic control: Semglee 12 units daily, Novolog 0-15 units TID with meals, Metformin 500 mg BID  Inpatient Diabetes Program Recommendations:    Insulin: Noted glucose down to 74 mg/dl today following Novolog correction this morning.  Please consider decreasing Novolog correction to 0-9 units (sensitive scale) TID with meals.  Thanks Barnie Alderman, RN, MSN, [place] Diabetes Coordinator Inpatient Diabetes Program [phone] (Team Pager from 8am to [place])

## 2021-12-10 NOTE — ED Notes (Signed)
Patient is quietly eating breakfast, no distress noted, will continue to monitor for safety.

## 2021-12-10 NOTE — ED Notes (Signed)
Patient is sitting on the bed quietly, no distress noted, will continue to monitor for safety

## 2021-12-10 NOTE — ED Notes (Signed)
Patient on the unit quietly watching television, no distress noted, will continue to monitor for safety

## 2021-12-10 NOTE — ED Provider Notes (Signed)
Searra Carnathan is a 61 y/o female with a past psychiatric history of schizophrenia, aggressive behavior, and possible intellectual disability presenting to [facility] on 11/23/21 voluntarily as a walk in via [facility] with complaints that she was locked out of the group home. Pt had already been discharged from this facility but had been living there temporarily while DSS was looking for new placement. Pt is boarding at [facility].   On reassessment, pt endorses euthymic mood. She states she spoke with staff yesterday and she is aware there is a meeting today at 1:30PM to discuss her placement. She denies suicidal, homicidal, or violent ideations. She denies auditory visual hallucinations or paranoia. She is pleasant, cooperative. There is no evidence of agitation, aggression. No delusions or paranoia elicited. Pt reports she has been tolerating her medications without adverse effects. She has been appropriate with staff and pts on the unit. There have been no behavioral outbursts, and she has received no as needed medications for agitation.

## 2021-12-10 NOTE — ED Provider Notes (Signed)
Note from Venida Jarvis, RN reviewed for inpatient diabetes program. Recommendation for decreasing Novolog correction to 0-9 units (sensitive scale) TID with meals. Orders modified to reflect recommendation.

## 2021-12-10 NOTE — ED Notes (Signed)
The patient is on the unit quietly watching television, no distress noted, will continue to monitor patient for safety

## 2021-12-11 DIAGNOSIS — F209 Schizophrenia, unspecified: Secondary | ICD-10-CM | POA: Diagnosis not present

## 2021-12-11 LAB — GLUCOSE, CAPILLARY
Glucose-Capillary: 123 mg/dL — ABNORMAL HIGH (ref 70–99)
Glucose-Capillary: 132 mg/dL — ABNORMAL HIGH (ref 70–99)
Glucose-Capillary: 110 mg/dL — ABNORMAL HIGH (ref 70–99)
Glucose-Capillary: 206 mg/dL — ABNORMAL HIGH (ref 70–99)

## 2021-12-11 MED ORDER — METFORMIN HCL ER 750 MG PO TB24
750.0000 mg | ORAL_TABLET | Freq: Two times a day (BID) | ORAL | Status: DC
  Administered 2021-12-11 – 2021-12-15 (×8): 750 mg via ORAL
  Filled 2021-12-11 (×8): qty 1

## 2021-12-11 NOTE — ED Provider Notes (Cosign Needed Addendum)
Name: Teresa Murillo Age: 61 y.o.  DOB: [birth year]  MRM: [id number]  Teresa Murillo is a 61 y/o female with a past psychiatric history of schizophrenia, aggressive behavior, and possible intellectual disability presenting to [facility] on 11/23/21 voluntarily as a walk in via [facility] with complaints that she was locked out of the group home. Pt had already been discharged from this facility but had been living there temporarily while DSS was looking for new placement. Pt is boarding at [facility].  Patient has been dismissed from group home due to multiple elopements.   Completed an AID to Capacity Evaluation (ACE) with attending Dr. Dwyane Dee on 12/11/2021.  Please see media tab for full details.   # Capacity evaluation to leave group home.   At the present time, the psychiatry consultation service believes the patient does not have decisional capacity with respect to leaving the group home. Criteria for decision making capacity requires patient be able to: (1) communicate a choice in a clear and consistent manner, (2) demonstrate adequate understanding of disclosed relevant information regarding her/his medical condition and treatment, possible benefits and risks of that treatment, and alternative approaches, (3) describe views of her/his medical condition, proposed treatment and its consequences, and (4) engage in a rational process of manipulating the relevant information to reach her/his decision. Based on our examination, the patient does not meet those criteria. It should be emphasized however, that capacity may need to be re-assessed, as the patient's mental status changes over time. Also, decisional capacity for other, specific treatment/disposition decisions will need to be evaluated on an individual basis.  Also patient does not have adequate understanding and appreciation of her diabetes, and the consequences of being non-adherent to her medications.   Signed: [name], DO Psychiatry  Resident, PGY-2 [facility]  12/11/2021, 3:55 PM

## 2021-12-11 NOTE — ED Notes (Signed)
Pt laying in her bed calm and cooperative. No c/o pain or distress. Will continue to monitor for safety 

## 2021-12-11 NOTE — ED Provider Notes (Signed)
Teresa Murillo is a 60 y/o female with a past psychiatric history of schizophrenia, aggressive behavior, and possible intellectual disability presenting to [facility] on 11/23/21 voluntarily as a walk in via [facility] with complaints that she was locked out of the group home. Pt had already been discharged from this facility but had been living there temporarily while DSS was looking for new placement. Pt is boarding at [facility].   On reassessment, pt endorses euthymic mood. Discussed with pt we are continuing to seek placement for her. She verbalized understanding. She denies suicidal, homicidal, or violent ideations. She denies auditory visual hallucinations or paranoia. She is pleasant, cooperative. There is no evidence of agitation, aggression. No delusions or paranoia elicited. Pt reports she has been tolerating her medications without adverse effects. She has been appropriate with staff and pts on the unit. There have been no behavioral outbursts, and she has received no as needed medications for agitation. Pt remains psychiatrically cleared at this time.

## 2021-12-11 NOTE — ED Notes (Signed)
Pt laying in her bed cal and cooperative . She had a snack. She has been having on and off  conversations ing  with staff. She has no c/o pain or distress. Will continue to monitor for safety

## 2021-12-11 NOTE — ED Notes (Signed)
Pt asleep in bed. Respirations even and unlabored. Monitoring for safety. 

## 2021-12-12 DIAGNOSIS — F209 Schizophrenia, unspecified: Secondary | ICD-10-CM | POA: Diagnosis not present

## 2021-12-12 LAB — GLUCOSE, CAPILLARY
Glucose-Capillary: 107 mg/dL — ABNORMAL HIGH (ref 70–99)
Glucose-Capillary: 156 mg/dL — ABNORMAL HIGH (ref 70–99)
Glucose-Capillary: 158 mg/dL — ABNORMAL HIGH (ref 70–99)
Glucose-Capillary: 188 mg/dL — ABNORMAL HIGH (ref 70–99)

## 2021-12-12 MED ORDER — MENTHOL 3 MG MT LOZG
1.0000 | LOZENGE | Freq: Once | OROMUCOSAL | Status: AC
  Administered 2021-12-20: 3 mg via ORAL
  Filled 2021-12-12 (×2): qty 9

## 2021-12-12 NOTE — ED Notes (Signed)
Pt sleeping@this time. breathing even and unlabored. Will continue to monitor for safety 

## 2021-12-12 NOTE — Progress Notes (Signed)
Pt is asleep. Respirations are even and unlabored. No distress noted. Staff will monitor for pt's safety. 

## 2021-12-12 NOTE — ED Provider Notes (Addendum)
Behavioral Health Progress Note  Date and Time: 12/12/2021 11:25 AM Name: Teresa Murillo MRN:  [id number]  Subjective:  Patient is a 61 year old female with a hx of Schizophrenia, aggressive behaviors and possible  IDD. Has resided in different group homes and has hx of elopement. Was brought in by [facility], voluntarily as a walk in, with complaints that she was locked out of the group home. Pt had already been discharged from this facility but had been living there temporarily while DSS was looking for new placement. Pt is boarding at [facility]  Diagnosis:  Final diagnoses:  At risk for self care deficit  Noncompliance  Self-care deficit for medication administration  Schizophrenia, unspecified type ([place])    Total Time spent with patient: 30 minutes   Patient was seen face-to-face, and chart and nursing notes reviewed.  Patient was in bed awake and pleasant upon approach. Alert / oriented x 3. Denying SI/HI/AVH. Patient reported sleeping and eating well and stated, "my blood sugar has been good.". She was calm and cooperative. She is adherent with medication regimen and had no concerns beside placement. Patient stated, "I just have nowhere to go.I am homeless". She reported no medication concerns, and her appetite has been good. Her vital signs are WNL and her last CBG was 107. I discussed  with nursing about patient' progress on the unit and no issues reported. Patient was encouraged to continue her medication regimen and to express her concerns as needed. No change on her current medication regimen.   Past Medical History:  Past Medical History:  Diagnosis Date   Chronic obstructive pulmonary disease (COPD) ([place])    Glaucoma    Hyperlipidemia    Hypertension    Iron deficiency    Schizoaffective disorder ([place])    Type 2 diabetes mellitus (HCC)     Past Surgical History:  Procedure Laterality Date   TUBAL LIGATION     Family History:  Family History  Problem Relation Age of Onset    Breast cancer Maternal Grandmother    Family Psychiatric  History: Patient reports that her mother  had Schizophrenia; now deceased Social History:  Social History   Substance and Sexual Activity  Alcohol Use Yes     Social History   Substance and Sexual Activity  Drug Use Not Currently    Social History   Socioeconomic History   Marital status: Divorced    Spouse name: Not on file   Number of children: Not on file   Years of education: Not on file   Highest education level: Not on file  Occupational History   Not on file  Tobacco Use   Smoking status: Every Day    Packs/day: 1.00    Types: Cigars, Cigarettes   Smokeless tobacco: Current  Vaping Use   Vaping Use: Never used  Substance and Sexual Activity   Alcohol use: Yes   Drug use: Not Currently   Sexual activity: Not Currently    Birth control/protection: Surgical  Other Topics Concern   Not on file  Social History Narrative   Not on file   Social Determinants of Health   Financial Resource Strain: Not on file  Food Insecurity: Not on file  Transportation Needs: Not on file  Physical Activity: Not on file  Stress: Not on file  Social Connections: Not on file   SDOH:  SDOH Screenings   Depression (PHQ2-9): Low Risk  (11/23/2021)  Tobacco Use: High Risk (12/07/2021)   Additional Social History:  Pain Medications: See MAR Prescriptions: See MAR Over the Counter: See MAR History of alcohol / drug use?: No history of alcohol / drug abuse Longest period of sobriety (when/how long): N/A                    Sleep: Good  Appetite:  Good  Current Medications:  Current Facility-Administered Medications  Medication Dose Route Frequency Provider Last Rate Last Admin   acetaminophen (TYLENOL) tablet 650 mg  650 mg Oral Q6H PRN Rankin, Shuvon B, NP   650 mg at 12/11/21 2355   albuterol (VENTOLIN HFA) 108 (90 Base) MCG/ACT inhaler 2 puff  2 puff Inhalation Q6H PRN Rankin, Shuvon B, NP   2 puff at  12/09/21 2102   alum & mag hydroxide-simeth (MAALOX/MYLANTA) 200-200-20 MG/5ML suspension 30 mL  30 mL Oral Q4H PRN Rankin, Shuvon B, NP       apixaban (ELIQUIS) tablet 5 mg  5 mg Oral BID Rankin, Shuvon B, NP   5 mg at 12/12/21 0939   cloZAPine (CLOZARIL) tablet 50 mg  50 mg Oral Daily [name], NP   50 mg at 12/12/21 0939   cloZAPine (CLOZARIL) tablet 75 mg  75 mg Oral QHS [name], NP   75 mg at 12/11/21 2114   diltiazem (CARDIZEM CD) 24 hr capsule 240 mg  240 mg Oral Daily Rankin, Shuvon B, NP   240 mg at 12/12/21 0939   divalproex (DEPAKOTE ER) 24 hr tablet 500 mg  500 mg Oral BID Rankin, Shuvon B, NP   500 mg at 12/12/21 0939   fluticasone furoate-vilanterol (BREO ELLIPTA) 200-25 MCG/ACT 1 puff  1 puff Inhalation Daily Rankin, Shuvon B, NP   1 puff at 12/12/21 0820   haloperidol (HALDOL) tablet 10 mg  10 mg Oral Q8H PRN Rankin, Shuvon B, NP       insulin aspart (novoLOG) injection 0-9 Units  0-9 Units Subcutaneous TID WC Tharon Aquas, NP   1 Units at 12/11/21 1215   insulin glargine-yfgn (SEMGLEE) injection 12 Units  12 Units Subcutaneous Daily Rankin, Shuvon B, NP   12 Units at 12/12/21 0938   latanoprost (XALATAN) 0.005 % ophthalmic solution 1 drop  1 drop Both Eyes QHS Rankin, Shuvon B, NP   1 drop at 12/11/21 2115   magnesium hydroxide (MILK OF MAGNESIA) suspension 30 mL  30 mL Oral Daily PRN Rankin, Shuvon B, NP       menthol-cetylpyridinium (CEPACOL) lozenge 3 mg  1 lozenge Oral Once Onuoha, Chinwendu V, NP       metFORMIN (GLUCOPHAGE-XR) 24 hr tablet 750 mg  750 mg Oral BID WC [name], DO   750 mg at 12/12/21 0816   mupirocin ointment (BACTROBAN) 2 % 1 Application  1 Application Topical TID Rankin, Shuvon B, NP   1 Application at 32/91/91 1027   nicotine (NICODERM CQ - dosed in mg/24 hr) patch 7 mg  7 mg Transdermal Daily Rankin, Shuvon B, NP   7 mg at 12/12/21 0940   Vitamin D (Ergocalciferol) (DRISDOL) 1.25 MG (50000 UNIT) capsule 50,000 Units  50,000 Units Oral  Q7 days Rankin, Shuvon B, NP   50,000 Units at 12/10/21 6606   Current Outpatient Medications  Medication Sig Dispense Refill   Accu-Chek Softclix Lancets lancets Use as directed up to four times daily 100 each 0   apixaban (ELIQUIS) 5 MG TABS tablet Take 1 tablet (5 mg total) by mouth 2 (two) times daily. 60 tablet 0  ARIPiprazole (ABILIFY) 10 MG tablet Take 1 tablet (10 mg total) by mouth daily. 30 tablet 0   Blood Glucose Monitoring Suppl (ACCU-CHEK GUIDE) w/Device KIT Use as directed up to four times daily 1 kit 0   budesonide-formoterol (SYMBICORT) 160-4.5 MCG/ACT inhaler Inhale 2 puffs into the lungs in the morning and at bedtime.     Cholecalciferol (VITAMIN D3) 1.25 MG (50000 UT) CAPS Take 50,000 Units by mouth every Thursday.     cloZAPine (CLOZARIL) 25 MG tablet Take 3 tablets (75 mg total) by mouth at bedtime. 90 tablet 0   clozapine (CLOZARIL) 50 MG tablet Take 1 tablet (50 mg total) by mouth daily. 30 tablet 0   diltiazem (CARDIZEM CD) 240 MG 24 hr capsule Take 1 capsule (240 mg total) by mouth daily. (Patient not taking: Reported on 11/24/2021) 30 capsule 0   divalproex (DEPAKOTE ER) 500 MG 24 hr tablet Take 1 tablet (500 mg total) by mouth 2 (two) times daily. 60 tablet 0   glucose blood test strip Use as directed up to four times daily 50 each 0   haloperidol (HALDOL) 10 MG tablet Take 1 tablet (10 mg total) by mouth 3 (three) times daily as needed for agitation (and psychotic symptoms).     INGREZZA 40 MG capsule Take 1 capsule (40 mg total) by mouth in the morning. 30 capsule 0   insulin aspart (NOVOLOG) 100 UNIT/ML FlexPen Before each meal 3 times a day, 140-199 - 2 units, 200-250 - 4 units, 251-299 - 6 units,  300-349 - 8 units,  350 or above 10 units. Insulin PEN if approved, provide syringes and needles if needed.Please switch to any approved short acting Insulin if needed. 15 mL 0   insulin glargine (LANTUS) 100 UNIT/ML Solostar Pen Inject 12 Units into the skin daily. 15 mL  0   Insulin Pen Needle 32G X 4 MM MISC Use 4 times a day with insulin, 1 month supply. 100 each 0   latanoprost (XALATAN) 0.005 % ophthalmic solution Place 1 drop into both eyes at bedtime.     metFORMIN (GLUCOPHAGE) 500 MG tablet Take 500 mg by mouth 2 (two) times daily with a meal.     nicotine (NICODERM CQ - DOSED IN MG/24 HR) 7 mg/24hr patch Place 1 patch (7 mg total) onto the skin daily. 28 patch 0   PROAIR HFA 108 (90 Base) MCG/ACT inhaler Inhale 2 puffs into the lungs every 6 (six) hours as needed for wheezing or shortness of breath.      Labs  Lab Results:  No results displayed because visit has over 200 results.    Admission on 11/22/2021, Discharged on 11/22/2021  Component Date Value Ref Range Status   Glucose-Capillary 11/22/2021 169 (H)  70 - 99 mg/dL Final   Glucose reference range applies only to samples taken after fasting for at least 8 hours.  No results displayed because visit has over 200 results.    Admission on 10/04/2021, Discharged on 10/22/2021  Component Date Value Ref Range Status   SARS Coronavirus 2 by RT PCR 10/04/2021 NEGATIVE  NEGATIVE Final   Comment: (NOTE) SARS-CoV-2 target nucleic acids are NOT DETECTED.  The SARS-CoV-2 RNA is generally detectable in upper respiratory specimens during the acute phase of infection. The lowest concentration of SARS-CoV-2 viral copies this assay can detect is 138 copies/mL. A negative result does not preclude SARS-Cov-2 infection and should not be used as the sole basis for treatment or other patient management decisions. A  negative result may occur with  improper specimen collection/handling, submission of specimen other than nasopharyngeal swab, presence of viral mutation(s) within the areas targeted by this assay, and inadequate number of viral copies(<138 copies/mL). A negative result must be combined with clinical observations, patient history, and epidemiological information. The expected result is  Negative.  Fact Sheet for Patients:  EntrepreneurPulse.com.au  Fact Sheet for Healthcare Providers:  IncredibleEmployment.be  This test is no                          t yet approved or cleared by the [place] FDA and  has been authorized for detection and/or diagnosis of SARS-CoV-2 by FDA under an Emergency Use Authorization (EUA). This EUA will remain  in effect (meaning this test can be used) for the duration of the COVID-19 declaration under Section 564(b)(1) of the Act, 21 U.S.C.section 360bbb-3(b)(1), unless the authorization is terminated  or revoked sooner.       Influenza A by PCR 10/04/2021 NEGATIVE  NEGATIVE Final   Influenza B by PCR 10/04/2021 NEGATIVE  NEGATIVE Final   Comment: (NOTE) The Xpert Xpress SARS-CoV-2/FLU/RSV plus assay is intended as an aid in the diagnosis of influenza from Nasopharyngeal swab specimens and should not be used as a sole basis for treatment. Nasal washings and aspirates are unacceptable for Xpert Xpress SARS-CoV-2/FLU/RSV testing.  Fact Sheet for Patients: EntrepreneurPulse.com.au  Fact Sheet for Healthcare Providers: IncredibleEmployment.be  This test is not yet approved or cleared by the [place] FDA and has been authorized for detection and/or diagnosis of SARS-CoV-2 by FDA under an Emergency Use Authorization (EUA). This EUA will remain in effect (meaning this test can be used) for the duration of the COVID-19 declaration under Section 564(b)(1) of the Act, 21 U.S.C. section 360bbb-3(b)(1), unless the authorization is terminated or revoked.  Performed at [facility], [place] [address], [place], [place] [address]    WBC 10/04/2021 8.4  4.0 - 10.5 K/uL Final   RBC 10/04/2021 4.42  3.87 - 5.11 MIL/uL Final   Hemoglobin 10/04/2021 11.6 (L)  12.0 - 15.0 g/dL Final   HCT 10/04/2021 35.7 (L)  36.0 - 46.0 % Final   MCV 10/04/2021 80.8  80.0 - 100.0  fL Final   MCH 10/04/2021 26.2  26.0 - 34.0 pg Final   MCHC 10/04/2021 32.5  30.0 - 36.0 g/dL Final   RDW 10/04/2021 16.0 (H)  11.5 - 15.5 % Final   Platelets 10/04/2021 372  150 - 400 K/uL Final   nRBC 10/04/2021 0.0  0.0 - 0.2 % Final   Neutrophils Relative % 10/04/2021 68  % Final   Neutro Abs 10/04/2021 5.7  1.7 - 7.7 K/uL Final   Lymphocytes Relative 10/04/2021 22  % Final   Lymphs Abs 10/04/2021 1.8  0.7 - 4.0 K/uL Final   Monocytes Relative 10/04/2021 8  % Final   Monocytes Absolute 10/04/2021 0.7  0.1 - 1.0 K/uL Final   Eosinophils Relative 10/04/2021 1  % Final   Eosinophils Absolute 10/04/2021 0.1  0.0 - 0.5 K/uL Final   Basophils Relative 10/04/2021 1  % Final   Basophils Absolute 10/04/2021 0.1  0.0 - 0.1 K/uL Final   Immature Granulocytes 10/04/2021 0  % Final   Abs Immature Granulocytes 10/04/2021 0.03  0.00 - 0.07 K/uL Final   Performed at [facility], [place] [address], [place], [place] [address]   Sodium 10/04/2021 136  135 - 145  mmol/L Final   Potassium 10/04/2021 4.2  3.5 - 5.1 mmol/L Final   Chloride 10/04/2021 104  98 - 111 mmol/L Final   CO2 10/04/2021 25  22 - 32 mmol/L Final   Glucose, Bld 10/04/2021 100 (H)  70 - 99 mg/dL Final   Glucose reference range applies only to samples taken after fasting for at least 8 hours.   BUN 10/04/2021 9  6 - 20 mg/dL Final   Creatinine, Ser 10/04/2021 0.52  0.44 - 1.00 mg/dL Final   Calcium 10/04/2021 9.0  8.9 - 10.3 mg/dL Final   Total Protein 10/04/2021 7.0  6.5 - 8.1 g/dL Final   Albumin 10/04/2021 3.2 (L)  3.5 - 5.0 g/dL Final   AST 10/04/2021 13 (L)  15 - 41 U/L Final   ALT 10/04/2021 10  0 - 44 U/L Final   Alkaline Phosphatase 10/04/2021 61  38 - 126 U/L Final   Total Bilirubin 10/04/2021 0.3  0.3 - 1.2 mg/dL Final   GFR, Estimated 10/04/2021 >60  >60 mL/min Final   Comment: (NOTE) Calculated using the CKD-EPI Creatinine Equation (2021)    Anion gap 10/04/2021 7  5 - 15 Final   Performed at [facility] [address], [place], [place] [address]   Hgb A1c MFr Bld 10/04/2021 7.0 (H)  4.8 - 5.6 % Final   Comment: (NOTE) Pre diabetes:          5.7%-6.4%  Diabetes:              >6.4%  Glycemic control for   <7.0% adults with diabetes    Mean Plasma Glucose 10/04/2021 154.2  mg/dL Final   Performed at [facility], [place] [address] [place], [place] [address]   Cholesterol 10/04/2021 178  0 - 200 mg/dL Final   Triglycerides 10/04/2021 155 (H)  <150 mg/dL Final   HDL 10/04/2021 52  >40 mg/dL Final   Total CHOL/HDL Ratio 10/04/2021 3.4  RATIO Final   VLDL 10/04/2021 31  0 - 40 mg/dL Final   LDL Cholesterol 10/04/2021 95  0 - 99 mg/dL Final   Comment:        Total Cholesterol/HDL:CHD Risk Coronary Heart Disease Risk Table                     Men   Women  1/2 Average Risk   3.4   3.3  Average Risk       5.0   4.4  2 X Average Risk   9.6   7.1  3 X Average Risk  23.4   11.0        Use the calculated Patient Ratio above and the CHD Risk Table to determine the patient's CHD Risk.        ATP III CLASSIFICATION (LDL):  <100     mg/dL   Optimal  100-129  mg/dL   Near or Above                    Optimal  130-159  mg/dL   Borderline  160-189  mg/dL   High  >190     mg/dL   Very High Performed at [facility] [address] [place], [place] [address]    POC Amphetamine UR 10/04/2021 None Detected  NONE DETECTED (Cut Off Level 1000 ng/mL) Final   POC Secobarbital (BAR) 10/04/2021 None Detected  NONE DETECTED (Cut Off Level 300 ng/mL) Final  POC Buprenorphine (BUP) 10/04/2021 None Detected  NONE DETECTED (Cut Off Level 10 ng/mL) Final   POC Oxazepam (BZO) 10/04/2021 None Detected  NONE DETECTED (Cut Off Level 300 ng/mL) Final   POC Cocaine UR 10/04/2021 None Detected  NONE DETECTED (Cut Off Level 300 ng/mL) Final   POC Methamphetamine UR 10/04/2021 None Detected  NONE DETECTED (Cut Off Level 1000 ng/mL) Final   POC Morphine 10/04/2021 None Detected  NONE DETECTED (Cut Off  Level 300 ng/mL) Final   POC Methadone UR 10/04/2021 None Detected  NONE DETECTED (Cut Off Level 300 ng/mL) Final   POC Oxycodone UR 10/04/2021 Positive (A)  NONE DETECTED (Cut Off Level 100 ng/mL) Final   POC Marijuana UR 10/04/2021 None Detected  NONE DETECTED (Cut Off Level 50 ng/mL) Final   SARSCOV2ONAVIRUS 2 AG 10/04/2021 NEGATIVE  NEGATIVE Final   Comment: (NOTE) SARS-CoV-2 antigen NOT DETECTED.   Negative results are presumptive.  Negative results do not preclude SARS-CoV-2 infection and should not be used as the sole basis for treatment or other patient management decisions, including infection  control decisions, particularly in the presence of clinical signs and  symptoms consistent with COVID-19, or in those who have been in contact with the virus.  Negative results must be combined with clinical observations, patient history, and epidemiological information. The expected result is Negative.  Fact Sheet for Patients: HandmadeRecipes.com.cy  Fact Sheet for Healthcare Providers: FuneralLife.at  This test is not yet approved or cleared by the [place] FDA and  has been authorized for detection and/or diagnosis of SARS-CoV-2 by FDA under an Emergency Use Authorization (EUA).  This EUA will remain in effect (meaning this test can be used) for the duration of  the COV                          ID-19 declaration under Section 564(b)(1) of the Act, 21 U.S.C. section 360bbb-3(b)(1), unless the authorization is terminated or revoked sooner.     Valproic Acid Lvl 10/04/2021 51  50.0 - 100.0 ug/mL Final   Performed at [facility] [address], [place], [place] [address]   Valproic Acid Lvl 10/08/2021 57  50.0 - 100.0 ug/mL Final   Performed at [facility] [address], [place], [place] [address]   Glucose-Capillary 10/09/2021 123 (H)  70 - 99 mg/dL Final   Glucose reference range applies only to samples taken  after fasting for at least 8 hours.  Admission on 09/09/2021, Discharged on 09/10/2021  Component Date Value Ref Range Status   Sodium 09/09/2021 134 (L)  135 - 145 mmol/L Final   Potassium 09/09/2021 4.3  3.5 - 5.1 mmol/L Final   Chloride 09/09/2021 99  98 - 111 mmol/L Final   CO2 09/09/2021 25  22 - 32 mmol/L Final   Glucose, Bld 09/09/2021 107 (H)  70 - 99 mg/dL Final   Glucose reference range applies only to samples taken after fasting for at least 8 hours.   BUN 09/09/2021 12  6 - 20 mg/dL Final   Creatinine, Ser 09/09/2021 0.74  0.44 - 1.00 mg/dL Final   Calcium 09/09/2021 9.0  8.9 - 10.3 mg/dL Final   Total Protein 09/09/2021 7.4  6.5 - 8.1 g/dL Final   Albumin 09/09/2021 3.1 (L)  3.5 - 5.0 g/dL Final   AST 09/09/2021 13 (L)  15 - 41 U/L Final   ALT 09/09/2021 13  0 - 44 U/L Final  Alkaline Phosphatase 09/09/2021 66  38 - 126 U/L Final   Total Bilirubin 09/09/2021 0.2 (L)  0.3 - 1.2 mg/dL Final   GFR, Estimated 09/09/2021 >60  >60 mL/min Final   Comment: (NOTE) Calculated using the CKD-EPI Creatinine Equation (2021)    Anion gap 09/09/2021 10  5 - 15 Final   Performed at [facility] [address], [place], [place] [address]   WBC 09/09/2021 8.5  4.0 - 10.5 K/uL Final   RBC 09/09/2021 4.53  3.87 - 5.11 MIL/uL Final   Hemoglobin 09/09/2021 11.8 (L)  12.0 - 15.0 g/dL Final   HCT 09/09/2021 38.0  36.0 - 46.0 % Final   MCV 09/09/2021 83.9  80.0 - 100.0 fL Final   MCH 09/09/2021 26.0  26.0 - 34.0 pg Final   MCHC 09/09/2021 31.1  30.0 - 36.0 g/dL Final   RDW 09/09/2021 17.8 (H)  11.5 - 15.5 % Final   Platelets 09/09/2021 442 (H)  150 - 400 K/uL Final   nRBC 09/09/2021 0.0  0.0 - 0.2 % Final   Neutrophils Relative % 09/09/2021 70  % Final   Neutro Abs 09/09/2021 5.9  1.7 - 7.7 K/uL Final   Lymphocytes Relative 09/09/2021 20  % Final   Lymphs Abs 09/09/2021 1.7  0.7 - 4.0 K/uL Final   Monocytes Relative 09/09/2021 8  % Final   Monocytes Absolute 09/09/2021 0.7  0.1 -  1.0 K/uL Final   Eosinophils Relative 09/09/2021 1  % Final   Eosinophils Absolute 09/09/2021 0.1  0.0 - 0.5 K/uL Final   Basophils Relative 09/09/2021 1  % Final   Basophils Absolute 09/09/2021 0.1  0.0 - 0.1 K/uL Final   Immature Granulocytes 09/09/2021 0  % Final   Abs Immature Granulocytes 09/09/2021 0.03  0.00 - 0.07 K/uL Final   Performed at [facility], [place] [address], [place], [place] [address]   Ammonia 09/09/2021 37 (H)  9 - 35 umol/L Final   Comment: HEMOLYSIS AT THIS LEVEL MAY AFFECT RESULT Performed at [facility], [place] [address] [place], [place] [address]    Opiates 09/09/2021 NONE DETECTED  NONE DETECTED Final   Cocaine 09/09/2021 NONE DETECTED  NONE DETECTED Final   Benzodiazepines 09/09/2021 NONE DETECTED  NONE DETECTED Final   Amphetamines 09/09/2021 NONE DETECTED  NONE DETECTED Final   Tetrahydrocannabinol 09/09/2021 NONE DETECTED  NONE DETECTED Final   Barbiturates 09/09/2021 NONE DETECTED  NONE DETECTED Final   Comment: (NOTE) DRUG SCREEN FOR MEDICAL PURPOSES ONLY.  IF CONFIRMATION IS NEEDED FOR ANY PURPOSE, NOTIFY LAB WITHIN 5 DAYS.  LOWEST DETECTABLE LIMITS FOR URINE DRUG SCREEN Drug Class                     Cutoff (ng/mL) Amphetamine and metabolites    1000 Barbiturate and metabolites    200 Benzodiazepine                 355 Tricyclics and metabolites     300 Opiates and metabolites        300 Cocaine and metabolites        300 THC                            50 Performed at [facility], [place] [address], [place], [place] [address]    Alcohol, Ethyl (B) 09/09/2021 <10  <10 mg/dL Final   Comment: (NOTE) Lowest  detectable limit for serum alcohol is 10 mg/dL.  For medical purposes only. Performed at [facility], [place] [address], [place], [place] [address]    Lipase 09/09/2021 33  11 - 51 U/L Final   Performed at [facility] [address], [place], [place] [address]   Color, Urine 09/09/2021 COLORLESS (A)  YELLOW  Final   APPearance 09/09/2021 CLEAR  CLEAR Final   Specific Gravity, Urine 09/09/2021 1.002 (L)  1.005 - 1.030 Final   pH 09/09/2021 6.0  5.0 - 8.0 Final   Glucose, UA 09/09/2021 NEGATIVE  NEGATIVE mg/dL Final   Hgb urine dipstick 09/09/2021 NEGATIVE  NEGATIVE Final   Bilirubin Urine 09/09/2021 NEGATIVE  NEGATIVE Final   Ketones, ur 09/09/2021 NEGATIVE  NEGATIVE mg/dL Final   Protein, ur 09/09/2021 NEGATIVE  NEGATIVE mg/dL Final   Nitrite 09/09/2021 NEGATIVE  NEGATIVE Final   Leukocytes,Ua 09/09/2021 NEGATIVE  NEGATIVE Final   Performed at [facility] [address] [place], [place] [address]   Specimen Description 09/09/2021 URINE, CLEAN CATCH   Final   Special Requests 09/09/2021 NONE   Final   Culture 09/09/2021  (A)   Final                   Value:<10,000 COLONIES/mL INSIGNIFICANT GROWTH Performed at [facility], [place] [address] [place], [place] [address]    Report Status 09/09/2021 09/10/2021 FINAL   Final   D-Dimer, Quant 09/09/2021 0.92 (H)  0.00 - 0.50 ug/mL-FEU Final   Comment: (NOTE) At the manufacturer cut-off value of 0.5 g/mL FEU, this assay has a negative predictive value of 95-100%.This assay is intended for use in conjunction with a clinical pretest probability (PTP) assessment model to exclude pulmonary embolism (PE) and deep venous thrombosis (DVT) in outpatients suspected of PE or DVT. Results should be correlated with clinical presentation. Performed at [facility], [place] [address], [place], [place] [address]    Troponin I (High Sensitivity) 09/09/2021 5  <18 ng/L Final   Comment: (NOTE) Elevated high sensitivity troponin I (hsTnI) values and significant  changes across serial measurements may suggest ACS but many other  chronic and acute conditions are known to elevate hsTnI results.  Refer to the "Links" section for chest pain algorithms and additional  guidance. Performed at [facility], [place] [address], [place],  [place] [address]    BP 09/10/2021 135/83  mmHg Final   S' Lateral 09/10/2021 2.30  cm Final   Area-P 1/2 09/10/2021 5.58  cm2 Final    Blood Alcohol level:  Lab Results  Component Value Date   ETH <10 11/23/2021   ETH <10 32/35/5732    Metabolic Disorder Labs: Lab Results  Component Value Date   HGBA1C 7.0 (H) 10/04/2021   MPG 154.2 10/04/2021   Lab Results  Component Value Date   PROLACTIN 3.8 (L) 11/23/2021   Lab Results  Component Value Date   CHOL 171 11/23/2021   TRIG 125 11/23/2021   HDL 65 11/23/2021   CHOLHDL 2.6 11/23/2021   VLDL 25 11/23/2021   LDLCALC 81 11/23/2021   LDLCALC 95 10/04/2021    Therapeutic Lab Levels: No results found for: "LITHIUM" Lab Results  Component Value Date   VALPROATE 54 12/03/2021   VALPROATE 45 (L) 11/09/2021   No results found for: "CBMZ"  Physical Findings   PHQ2-9    [place] ED from 11/23/2021 in [facility]  PHQ-2  Total Score 2  PHQ-9 Total Score 3      Flowsheet Row ED from 11/23/2021 in [facility] Most recent reading at 11/23/2021  6:44 PM ED from 11/22/2021 in [place] [facility] Most recent reading at 11/22/2021  8:28 PM ED from 11/22/2021 in [place] [facility] Most recent reading at 11/22/2021  1:24 AM  C-SSRS RISK CATEGORY No Risk No Risk No Risk        Musculoskeletal  Strength & Muscle Tone: within normal limits Gait & Station: normal Patient leans: N/A  Psychiatric Specialty Exam  Presentation  General Appearance:  Disheveled  Eye Contact: Good  Speech: Clear and Coherent; Normal Rate  Speech Volume: Normal  Handedness: Right   Mood and Affect  Mood: Euthymic  Affect: Appropriate; Congruent   Thought Process  Thought Processes: Coherent  Descriptions of Associations:Intact  Orientation:Full (Time, Place and Person)  Thought Content:Logical  Diagnosis of  Schizophrenia or Schizoaffective disorder in past: Yes  Duration of Psychotic Symptoms: No data recorded  Hallucinations:Hallucinations: None  Ideas of Reference:None  Suicidal Thoughts:Suicidal Thoughts: No  Homicidal Thoughts:Homicidal Thoughts: No   Sensorium  Memory: Immediate Good; Recent Fair; Remote Fair  Judgment: Fair  Insight: Fair   Community education officer  Concentration: Fair  Attention Span: Good  Recall: Good  Fund of Knowledge: Good  Language: Good   Psychomotor Activity  Psychomotor Activity: Psychomotor Activity: Normal   Assets  Assets: Communication Skills; Physical Health; Desire for Improvement   Sleep  Sleep: Sleep: Good   Nutritional Assessment (For [facility] and [facility] admissions only) Has the patient had a weight loss or gain of 10 pounds or more in the last 3 months?: No Has the patient had a decrease in food intake/or appetite?: No Does the patient have dental problems?: No Does the patient have eating habits or behaviors that may be indicators of an eating disorder including binging or inducing vomiting?: No Has the patient recently lost weight without trying?: 0 Has the patient been eating poorly because of a decreased appetite?: 0 Malnutrition Screening Tool Score: 0    Physical Exam  Physical Exam Constitutional:      Appearance: Normal appearance.  HENT:     Head: Normocephalic.     Right Ear: Tympanic membrane normal.     Left Ear: Tympanic membrane normal.     Nose: Nose normal.  Eyes:     Extraocular Movements: Extraocular movements intact.     Pupils: Pupils are equal, round, and reactive to light.  Cardiovascular:     Pulses: Normal pulses.  Pulmonary:     Effort: Pulmonary effort is normal.  Musculoskeletal:     Cervical back: Normal range of motion.  Neurological:     General: No focal deficit present.     Mental Status: She is alert and oriented to person, place, and time.  Psychiatric:        Mood and  Affect: Mood normal.        Behavior: Behavior normal.        Thought Content: Thought content normal.        Judgment: Judgment normal.    ROS Blood pressure 111/79, pulse 90, temperature 98.4 F (36.9 C), temperature source Oral, resp. rate 18, SpO2 98 %. There is no height or weight on file to calculate BMI.  Treatment Plan Summary: Daily contact with patient to assess and evaluate symptoms and progress in treatment Continue current medication regimen CSW to continue working  on placement  Ronelle Nigh, NP 12/12/2021 11:25 AM

## 2021-12-12 NOTE — ED Notes (Signed)
Pt resting quietly, Aox4.  Denies SI, HI, AVH, and pain.  Reports she is working with social work to find new housing.  Breathing is even and unlabored.  Will continue to monitor for safety.

## 2021-12-12 NOTE — Progress Notes (Signed)
Patient awake and alert. Patient compliant with medications but still requires reinforcement with diabetic diet/treatment. Patient ate breakfast and stated she slept well. Patient denies SI,HI, and A/V/H with no plan/intent. Patient denies any discomfort at this time and is currently calmly resting.

## 2021-12-12 NOTE — ED Notes (Signed)
Pt resting quietly with eyes closed.  No pain or discomfort noted/voiced.  Breathing is even and unlabored.  Will continue to monitor for safety.  

## 2021-12-13 DIAGNOSIS — F209 Schizophrenia, unspecified: Secondary | ICD-10-CM | POA: Diagnosis not present

## 2021-12-13 LAB — GLUCOSE, CAPILLARY
Glucose-Capillary: 99 mg/dL (ref 70–99)
Glucose-Capillary: 145 mg/dL — ABNORMAL HIGH (ref 70–99)
Glucose-Capillary: 109 mg/dL — ABNORMAL HIGH (ref 70–99)
Glucose-Capillary: 174 mg/dL — ABNORMAL HIGH (ref 70–99)

## 2021-12-13 NOTE — ED Notes (Signed)
Meal received 

## 2021-12-13 NOTE — ED Notes (Signed)
Pt resting quietly with eyes closed.  No pain or discomfort noted/voiced.  Breathing is even and unlabored.  Will continue to monitor for safety.  

## 2021-12-13 NOTE — ED Notes (Signed)
Pt is A &Ox 4. Pt is sitting on stretcher, pleasant and calm. Pt denies SI/HI/AVH. Will continue to monitor.

## 2021-12-13 NOTE — ED Provider Notes (Cosign Needed Addendum)
Behavioral Health Progress Note  Date and Time: 12/13/2021 8:25 AM Name: Teresa Murillo MRN:  [id number]  Subjective: Patient states "I know I won't be leaving here on the weekend will I?  I will be okay here until I find another place."  Teresa Murillo is reassessed, face-to-face, by nurse practitioner.  She is seated in observation area upon my approach, eating breakfast.  She is alert and oriented, pleasant and cooperative during assessment.  She presents with euthymic mood, congruent affect.  Patient continues to deny suicidal and homicidal ideations.  She easily contracts verbally for safety with this Probation officer.  She denies auditory and visual hallucinations.  There is no evidence of delusional thought content and no indication that patient is responding to internal stimuli.  Teresa Murillo endorses average sleep and appetite.  She denies complaints at this time.  Patient endorses understanding of current treatment plan.  She is aware that [facility] team is actively seeking alternative placement.  She understands that she will not be permitted to return to most recent group home as group home has discharged her.  Teresa Murillo reports, given her preference, she would prefer to return to most recent group home.  Patient offered support and encouragement.  Diagnosis:  Final diagnoses:  At risk for self care deficit  Noncompliance  Self-care deficit for medication administration  Schizophrenia, unspecified type ([place])    Total Time spent with patient: 20 minutes  Past Psychiatric History: Schizoaffective disorder Past Medical History:  Past Medical History:  Diagnosis Date   Chronic obstructive pulmonary disease (COPD) ([place])    Glaucoma    Hyperlipidemia    Hypertension    Iron deficiency    Schizoaffective disorder ([place])    Type 2 diabetes mellitus ([place])     Past Surgical History:  Procedure Laterality Date   TUBAL LIGATION     Family History:  Family History  Problem Relation Age of Onset   Breast  cancer Maternal Grandmother    Family Psychiatric  History: none reported Social History:  Social History   Substance and Sexual Activity  Alcohol Use Yes     Social History   Substance and Sexual Activity  Drug Use Not Currently    Social History   Socioeconomic History   Marital status: Divorced    Spouse name: Not on file   Number of children: Not on file   Years of education: Not on file   Highest education level: Not on file  Occupational History   Not on file  Tobacco Use   Smoking status: Every Day    Packs/day: 1.00    Types: Cigars, Cigarettes   Smokeless tobacco: Current  Vaping Use   Vaping Use: Never used  Substance and Sexual Activity   Alcohol use: Yes   Drug use: Not Currently   Sexual activity: Not Currently    Birth control/protection: Surgical  Other Topics Concern   Not on file  Social History Narrative   Not on file   Social Determinants of Health   Financial Resource Strain: Not on file  Food Insecurity: Not on file  Transportation Needs: Not on file  Physical Activity: Not on file  Stress: Not on file  Social Connections: Not on file   SDOH:  SDOH Screenings   Depression (PHQ2-9): Low Risk  (11/23/2021)  Tobacco Use: High Risk (12/07/2021)   Additional Social History:    Pain Medications: See MAR Prescriptions: See MAR Over the Counter: See MAR History of alcohol / drug use?: No history of  alcohol / drug abuse Longest period of sobriety (when/how long): N/A                    Sleep: Good  Appetite:  Good  Current Medications:  Current Facility-Administered Medications  Medication Dose Route Frequency Provider Last Rate Last Admin   acetaminophen (TYLENOL) tablet 650 mg  650 mg Oral Q6H PRN Rankin, Shuvon B, NP   650 mg at 12/11/21 2355   albuterol (VENTOLIN HFA) 108 (90 Base) MCG/ACT inhaler 2 puff  2 puff Inhalation Q6H PRN Rankin, Shuvon B, NP   2 puff at 12/09/21 2102   alum & mag hydroxide-simeth  (MAALOX/MYLANTA) 200-200-20 MG/5ML suspension 30 mL  30 mL Oral Q4H PRN Rankin, Shuvon B, NP       apixaban (ELIQUIS) tablet 5 mg  5 mg Oral BID Rankin, Shuvon B, NP   5 mg at 12/12/21 2107   cloZAPine (CLOZARIL) tablet 50 mg  50 mg Oral Daily [name], NP   50 mg at 12/12/21 0939   cloZAPine (CLOZARIL) tablet 75 mg  75 mg Oral QHS [name], NP   75 mg at 12/12/21 2107   diltiazem (CARDIZEM CD) 24 hr capsule 240 mg  240 mg Oral Daily Rankin, Shuvon B, NP   240 mg at 12/12/21 0939   divalproex (DEPAKOTE ER) 24 hr tablet 500 mg  500 mg Oral BID Rankin, Shuvon B, NP   500 mg at 12/12/21 2107   fluticasone furoate-vilanterol (BREO ELLIPTA) 200-25 MCG/ACT 1 puff  1 puff Inhalation Daily Rankin, Shuvon B, NP   1 puff at 12/13/21 0816   haloperidol (HALDOL) tablet 10 mg  10 mg Oral Q8H PRN Rankin, Shuvon B, NP       insulin aspart (novoLOG) injection 0-9 Units  0-9 Units Subcutaneous TID WC Tharon Aquas, NP   2 Units at 12/12/21 1710   insulin glargine-yfgn (SEMGLEE) injection 12 Units  12 Units Subcutaneous Daily Rankin, Shuvon B, NP   12 Units at 12/12/21 0938   latanoprost (XALATAN) 0.005 % ophthalmic solution 1 drop  1 drop Both Eyes QHS Rankin, Shuvon B, NP   1 drop at 12/12/21 2106   magnesium hydroxide (MILK OF MAGNESIA) suspension 30 mL  30 mL Oral Daily PRN Rankin, Shuvon B, NP       menthol-cetylpyridinium (CEPACOL) lozenge 3 mg  1 lozenge Oral Once Onuoha, Chinwendu V, NP       metFORMIN (GLUCOPHAGE-XR) 24 hr tablet 750 mg  750 mg Oral BID WC [name], DO   750 mg at 12/13/21 0816   mupirocin ointment (BACTROBAN) 2 % 1 Application  1 Application Topical TID Rankin, Shuvon B, NP   1 Application at 00/93/81 2105   nicotine (NICODERM CQ - dosed in mg/24 hr) patch 7 mg  7 mg Transdermal Daily Rankin, Shuvon B, NP   7 mg at 12/12/21 0940   Vitamin D (Ergocalciferol) (DRISDOL) 1.25 MG (50000 UNIT) capsule 50,000 Units  50,000 Units Oral Q7 days Rankin, Shuvon B, NP   50,000 Units  at 12/10/21 8299   Current Outpatient Medications  Medication Sig Dispense Refill   Accu-Chek Softclix Lancets lancets Use as directed up to four times daily 100 each 0   apixaban (ELIQUIS) 5 MG TABS tablet Take 1 tablet (5 mg total) by mouth 2 (two) times daily. 60 tablet 0   ARIPiprazole (ABILIFY) 10 MG tablet Take 1 tablet (10 mg total) by mouth daily. 30 tablet 0  Blood Glucose Monitoring Suppl (ACCU-CHEK GUIDE) w/Device KIT Use as directed up to four times daily 1 kit 0   budesonide-formoterol (SYMBICORT) 160-4.5 MCG/ACT inhaler Inhale 2 puffs into the lungs in the morning and at bedtime.     Cholecalciferol (VITAMIN D3) 1.25 MG (50000 UT) CAPS Take 50,000 Units by mouth every Thursday.     cloZAPine (CLOZARIL) 25 MG tablet Take 3 tablets (75 mg total) by mouth at bedtime. 90 tablet 0   clozapine (CLOZARIL) 50 MG tablet Take 1 tablet (50 mg total) by mouth daily. 30 tablet 0   diltiazem (CARDIZEM CD) 240 MG 24 hr capsule Take 1 capsule (240 mg total) by mouth daily. (Patient not taking: Reported on 11/24/2021) 30 capsule 0   divalproex (DEPAKOTE ER) 500 MG 24 hr tablet Take 1 tablet (500 mg total) by mouth 2 (two) times daily. 60 tablet 0   glucose blood test strip Use as directed up to four times daily 50 each 0   haloperidol (HALDOL) 10 MG tablet Take 1 tablet (10 mg total) by mouth 3 (three) times daily as needed for agitation (and psychotic symptoms).     INGREZZA 40 MG capsule Take 1 capsule (40 mg total) by mouth in the morning. 30 capsule 0   insulin aspart (NOVOLOG) 100 UNIT/ML FlexPen Before each meal 3 times a day, 140-199 - 2 units, 200-250 - 4 units, 251-299 - 6 units,  300-349 - 8 units,  350 or above 10 units. Insulin PEN if approved, provide syringes and needles if needed.Please switch to any approved short acting Insulin if needed. 15 mL 0   insulin glargine (LANTUS) 100 UNIT/ML Solostar Pen Inject 12 Units into the skin daily. 15 mL 0   Insulin Pen Needle 32G X 4 MM MISC Use  4 times a day with insulin, 1 month supply. 100 each 0   latanoprost (XALATAN) 0.005 % ophthalmic solution Place 1 drop into both eyes at bedtime.     metFORMIN (GLUCOPHAGE) 500 MG tablet Take 500 mg by mouth 2 (two) times daily with a meal.     nicotine (NICODERM CQ - DOSED IN MG/24 HR) 7 mg/24hr patch Place 1 patch (7 mg total) onto the skin daily. 28 patch 0   PROAIR HFA 108 (90 Base) MCG/ACT inhaler Inhale 2 puffs into the lungs every 6 (six) hours as needed for wheezing or shortness of breath.      Labs  Lab Results:  No results displayed because visit has over 200 results.    Admission on 11/22/2021, Discharged on 11/22/2021  Component Date Value Ref Range Status   Glucose-Capillary 11/22/2021 169 (H)  70 - 99 mg/dL Final   Glucose reference range applies only to samples taken after fasting for at least 8 hours.  No results displayed because visit has over 200 results.    Admission on 10/04/2021, Discharged on 10/22/2021  Component Date Value Ref Range Status   SARS Coronavirus 2 by RT PCR 10/04/2021 NEGATIVE  NEGATIVE Final   Comment: (NOTE) SARS-CoV-2 target nucleic acids are NOT DETECTED.  The SARS-CoV-2 RNA is generally detectable in upper respiratory specimens during the acute phase of infection. The lowest concentration of SARS-CoV-2 viral copies this assay can detect is 138 copies/mL. A negative result does not preclude SARS-Cov-2 infection and should not be used as the sole basis for treatment or other patient management decisions. A negative result may occur with  improper specimen collection/handling, submission of specimen other than nasopharyngeal swab, presence of viral  mutation(s) within the areas targeted by this assay, and inadequate number of viral copies(<138 copies/mL). A negative result must be combined with clinical observations, patient history, and epidemiological information. The expected result is Negative.  Fact Sheet for Patients:   EntrepreneurPulse.com.au  Fact Sheet for Healthcare Providers:  IncredibleEmployment.be  This test is no                          t yet approved or cleared by the [place] FDA and  has been authorized for detection and/or diagnosis of SARS-CoV-2 by FDA under an Emergency Use Authorization (EUA). This EUA will remain  in effect (meaning this test can be used) for the duration of the COVID-19 declaration under Section 564(b)(1) of the Act, 21 U.S.C.section 360bbb-3(b)(1), unless the authorization is terminated  or revoked sooner.       Influenza A by PCR 10/04/2021 NEGATIVE  NEGATIVE Final   Influenza B by PCR 10/04/2021 NEGATIVE  NEGATIVE Final   Comment: (NOTE) The Xpert Xpress SARS-CoV-2/FLU/RSV plus assay is intended as an aid in the diagnosis of influenza from Nasopharyngeal swab specimens and should not be used as a sole basis for treatment. Nasal washings and aspirates are unacceptable for Xpert Xpress SARS-CoV-2/FLU/RSV testing.  Fact Sheet for Patients: EntrepreneurPulse.com.au  Fact Sheet for Healthcare Providers: IncredibleEmployment.be  This test is not yet approved or cleared by the [place] FDA and has been authorized for detection and/or diagnosis of SARS-CoV-2 by FDA under an Emergency Use Authorization (EUA). This EUA will remain in effect (meaning this test can be used) for the duration of the COVID-19 declaration under Section 564(b)(1) of the Act, 21 U.S.C. section 360bbb-3(b)(1), unless the authorization is terminated or revoked.  Performed at [facility], [place] [address], [place], [place] [address]    WBC 10/04/2021 8.4  4.0 - 10.5 K/uL Final   RBC 10/04/2021 4.42  3.87 - 5.11 MIL/uL Final   Hemoglobin 10/04/2021 11.6 (L)  12.0 - 15.0 g/dL Final   HCT 10/04/2021 35.7 (L)  36.0 - 46.0 % Final   MCV 10/04/2021 80.8  80.0 - 100.0 fL Final   MCH 10/04/2021 26.2  26.0  - 34.0 pg Final   MCHC 10/04/2021 32.5  30.0 - 36.0 g/dL Final   RDW 10/04/2021 16.0 (H)  11.5 - 15.5 % Final   Platelets 10/04/2021 372  150 - 400 K/uL Final   nRBC 10/04/2021 0.0  0.0 - 0.2 % Final   Neutrophils Relative % 10/04/2021 68  % Final   Neutro Abs 10/04/2021 5.7  1.7 - 7.7 K/uL Final   Lymphocytes Relative 10/04/2021 22  % Final   Lymphs Abs 10/04/2021 1.8  0.7 - 4.0 K/uL Final   Monocytes Relative 10/04/2021 8  % Final   Monocytes Absolute 10/04/2021 0.7  0.1 - 1.0 K/uL Final   Eosinophils Relative 10/04/2021 1  % Final   Eosinophils Absolute 10/04/2021 0.1  0.0 - 0.5 K/uL Final   Basophils Relative 10/04/2021 1  % Final   Basophils Absolute 10/04/2021 0.1  0.0 - 0.1 K/uL Final   Immature Granulocytes 10/04/2021 0  % Final   Abs Immature Granulocytes 10/04/2021 0.03  0.00 - 0.07 K/uL Final   Performed at [facility], [place] [address] [place], [place] [address]   Sodium 10/04/2021 136  135 - 145 mmol/L Final   Potassium 10/04/2021 4.2  3.5 - 5.1 mmol/L Final   Chloride 10/04/2021 104  98 - 111 mmol/L Final   CO2 10/04/2021 25  22 - 32 mmol/L Final   Glucose, Bld 10/04/2021 100 (H)  70 - 99 mg/dL Final   Glucose reference range applies only to samples taken after fasting for at least 8 hours.   BUN 10/04/2021 9  6 - 20 mg/dL Final   Creatinine, Ser 10/04/2021 0.52  0.44 - 1.00 mg/dL Final   Calcium 10/04/2021 9.0  8.9 - 10.3 mg/dL Final   Total Protein 10/04/2021 7.0  6.5 - 8.1 g/dL Final   Albumin 10/04/2021 3.2 (L)  3.5 - 5.0 g/dL Final   AST 10/04/2021 13 (L)  15 - 41 U/L Final   ALT 10/04/2021 10  0 - 44 U/L Final   Alkaline Phosphatase 10/04/2021 61  38 - 126 U/L Final   Total Bilirubin 10/04/2021 0.3  0.3 - 1.2 mg/dL Final   GFR, Estimated 10/04/2021 >60  >60 mL/min Final   Comment: (NOTE) Calculated using the CKD-EPI Creatinine Equation (2021)    Anion gap 10/04/2021 7  5 - 15 Final   Performed at [facility] [address], [place], [place]  [address]   Hgb A1c MFr Bld 10/04/2021 7.0 (H)  4.8 - 5.6 % Final   Comment: (NOTE) Pre diabetes:          5.7%-6.4%  Diabetes:              >6.4%  Glycemic control for   <7.0% adults with diabetes    Mean Plasma Glucose 10/04/2021 154.2  mg/dL Final   Performed at [facility], [place] [address], [place], [place] [address]   Cholesterol 10/04/2021 178  0 - 200 mg/dL Final   Triglycerides 10/04/2021 155 (H)  <150 mg/dL Final   HDL 10/04/2021 52  >40 mg/dL Final   Total CHOL/HDL Ratio 10/04/2021 3.4  RATIO Final   VLDL 10/04/2021 31  0 - 40 mg/dL Final   LDL Cholesterol 10/04/2021 95  0 - 99 mg/dL Final   Comment:        Total Cholesterol/HDL:CHD Risk Coronary Heart Disease Risk Table                     Men   Women  1/2 Average Risk   3.4   3.3  Average Risk       5.0   4.4  2 X Average Risk   9.6   7.1  3 X Average Risk  23.4   11.0        Use the calculated Patient Ratio above and the CHD Risk Table to determine the patient's CHD Risk.        ATP III CLASSIFICATION (LDL):  <100     mg/dL   Optimal  100-129  mg/dL   Near or Above                    Optimal  130-159  mg/dL   Borderline  160-189  mg/dL   High  >190     mg/dL   Very High Performed at [facility] [address] [place], [place] [address]    POC Amphetamine UR 10/04/2021 None Detected  NONE DETECTED (Cut Off Level 1000 ng/mL) Final   POC Secobarbital (BAR) 10/04/2021 None Detected  NONE DETECTED (Cut Off Level 300 ng/mL) Final   POC Buprenorphine (BUP) 10/04/2021 None Detected  NONE DETECTED (Cut Off Level 10 ng/mL) Final   POC Oxazepam (  BZO) 10/04/2021 None Detected  NONE DETECTED (Cut Off Level 300 ng/mL) Final   POC Cocaine UR 10/04/2021 None Detected  NONE DETECTED (Cut Off Level 300 ng/mL) Final   POC Methamphetamine UR 10/04/2021 None Detected  NONE DETECTED (Cut Off Level 1000 ng/mL) Final   POC Morphine 10/04/2021 None Detected  NONE DETECTED (Cut Off Level 300 ng/mL) Final   POC Methadone  UR 10/04/2021 None Detected  NONE DETECTED (Cut Off Level 300 ng/mL) Final   POC Oxycodone UR 10/04/2021 Positive (A)  NONE DETECTED (Cut Off Level 100 ng/mL) Final   POC Marijuana UR 10/04/2021 None Detected  NONE DETECTED (Cut Off Level 50 ng/mL) Final   SARSCOV2ONAVIRUS 2 AG 10/04/2021 NEGATIVE  NEGATIVE Final   Comment: (NOTE) SARS-CoV-2 antigen NOT DETECTED.   Negative results are presumptive.  Negative results do not preclude SARS-CoV-2 infection and should not be used as the sole basis for treatment or other patient management decisions, including infection  control decisions, particularly in the presence of clinical signs and  symptoms consistent with COVID-19, or in those who have been in contact with the virus.  Negative results must be combined with clinical observations, patient history, and epidemiological information. The expected result is Negative.  Fact Sheet for Patients: HandmadeRecipes.com.cy  Fact Sheet for Healthcare Providers: FuneralLife.at  This test is not yet approved or cleared by the [place] FDA and  has been authorized for detection and/or diagnosis of SARS-CoV-2 by FDA under an Emergency Use Authorization (EUA).  This EUA will remain in effect (meaning this test can be used) for the duration of  the COV                          ID-19 declaration under Section 564(b)(1) of the Act, 21 U.S.C. section 360bbb-3(b)(1), unless the authorization is terminated or revoked sooner.     Valproic Acid Lvl 10/04/2021 51  50.0 - 100.0 ug/mL Final   Performed at [facility] [address], [place], [place] [address]   Valproic Acid Lvl 10/08/2021 57  50.0 - 100.0 ug/mL Final   Performed at [facility] [address], [place], [place] [address]   Glucose-Capillary 10/09/2021 123 (H)  70 - 99 mg/dL Final   Glucose reference range applies only to samples taken after fasting for at least 8 hours.   Admission on 09/09/2021, Discharged on 09/10/2021  Component Date Value Ref Range Status   Sodium 09/09/2021 134 (L)  135 - 145 mmol/L Final   Potassium 09/09/2021 4.3  3.5 - 5.1 mmol/L Final   Chloride 09/09/2021 99  98 - 111 mmol/L Final   CO2 09/09/2021 25  22 - 32 mmol/L Final   Glucose, Bld 09/09/2021 107 (H)  70 - 99 mg/dL Final   Glucose reference range applies only to samples taken after fasting for at least 8 hours.   BUN 09/09/2021 12  6 - 20 mg/dL Final   Creatinine, Ser 09/09/2021 0.74  0.44 - 1.00 mg/dL Final   Calcium 09/09/2021 9.0  8.9 - 10.3 mg/dL Final   Total Protein 09/09/2021 7.4  6.5 - 8.1 g/dL Final   Albumin 09/09/2021 3.1 (L)  3.5 - 5.0 g/dL Final   AST 09/09/2021 13 (L)  15 - 41 U/L Final   ALT 09/09/2021 13  0 - 44 U/L Final   Alkaline Phosphatase 09/09/2021 66  38 - 126 U/L Final   Total Bilirubin 09/09/2021 0.2 (L)  0.3 - 1.2 mg/dL Final   GFR, Estimated 09/09/2021 >60  >60 mL/min Final   Comment: (NOTE) Calculated using the CKD-EPI Creatinine Equation (2021)    Anion gap 09/09/2021 10  5 - 15 Final   Performed at [facility] [address] [place], [place] [address]   WBC 09/09/2021 8.5  4.0 - 10.5 K/uL Final   RBC 09/09/2021 4.53  3.87 - 5.11 MIL/uL Final   Hemoglobin 09/09/2021 11.8 (L)  12.0 - 15.0 g/dL Final   HCT 09/09/2021 38.0  36.0 - 46.0 % Final   MCV 09/09/2021 83.9  80.0 - 100.0 fL Final   MCH 09/09/2021 26.0  26.0 - 34.0 pg Final   MCHC 09/09/2021 31.1  30.0 - 36.0 g/dL Final   RDW 09/09/2021 17.8 (H)  11.5 - 15.5 % Final   Platelets 09/09/2021 442 (H)  150 - 400 K/uL Final   nRBC 09/09/2021 0.0  0.0 - 0.2 % Final   Neutrophils Relative % 09/09/2021 70  % Final   Neutro Abs 09/09/2021 5.9  1.7 - 7.7 K/uL Final   Lymphocytes Relative 09/09/2021 20  % Final   Lymphs Abs 09/09/2021 1.7  0.7 - 4.0 K/uL Final   Monocytes Relative 09/09/2021 8  % Final   Monocytes Absolute 09/09/2021 0.7  0.1 - 1.0 K/uL Final   Eosinophils Relative  09/09/2021 1  % Final   Eosinophils Absolute 09/09/2021 0.1  0.0 - 0.5 K/uL Final   Basophils Relative 09/09/2021 1  % Final   Basophils Absolute 09/09/2021 0.1  0.0 - 0.1 K/uL Final   Immature Granulocytes 09/09/2021 0  % Final   Abs Immature Granulocytes 09/09/2021 0.03  0.00 - 0.07 K/uL Final   Performed at [facility], [place] [address], [place], [place] [address]   Ammonia 09/09/2021 37 (H)  9 - 35 umol/L Final   Comment: HEMOLYSIS AT THIS LEVEL MAY AFFECT RESULT Performed at [facility], [place] [address], [place], [place] [address]    Opiates 09/09/2021 NONE DETECTED  NONE DETECTED Final   Cocaine 09/09/2021 NONE DETECTED  NONE DETECTED Final   Benzodiazepines 09/09/2021 NONE DETECTED  NONE DETECTED Final   Amphetamines 09/09/2021 NONE DETECTED  NONE DETECTED Final   Tetrahydrocannabinol 09/09/2021 NONE DETECTED  NONE DETECTED Final   Barbiturates 09/09/2021 NONE DETECTED  NONE DETECTED Final   Comment: (NOTE) DRUG SCREEN FOR MEDICAL PURPOSES ONLY.  IF CONFIRMATION IS NEEDED FOR ANY PURPOSE, NOTIFY LAB WITHIN 5 DAYS.  LOWEST DETECTABLE LIMITS FOR URINE DRUG SCREEN Drug Class                     Cutoff (ng/mL) Amphetamine and metabolites    1000 Barbiturate and metabolites    200 Benzodiazepine                 623 Tricyclics and metabolites     300 Opiates and metabolites        300 Cocaine and metabolites        300 THC                            50 Performed at [facility], [place] [address] [place], [place] [address]    Alcohol, Ethyl (B) 09/09/2021 <10  <10 mg/dL Final   Comment: (NOTE) Lowest detectable limit for serum alcohol is 10 mg/dL.  For medical purposes only. Performed at [facility], [address], [place], [place] [address]    Lipase 09/09/2021 33  11 - 51 U/L Final   Performed at [facility] [address] [place], [place] [address]   Color, Urine 09/09/2021 COLORLESS (A)  YELLOW Final   APPearance 09/09/2021 CLEAR   CLEAR Final   Specific Gravity, Urine 09/09/2021 1.002 (L)  1.005 - 1.030 Final   pH 09/09/2021 6.0  5.0 - 8.0 Final   Glucose, UA 09/09/2021 NEGATIVE  NEGATIVE mg/dL Final   Hgb urine dipstick 09/09/2021 NEGATIVE  NEGATIVE Final   Bilirubin Urine 09/09/2021 NEGATIVE  NEGATIVE Final   Ketones, ur 09/09/2021 NEGATIVE  NEGATIVE mg/dL Final   Protein, ur 09/09/2021 NEGATIVE  NEGATIVE mg/dL Final   Nitrite 09/09/2021 NEGATIVE  NEGATIVE Final   Leukocytes,Ua 09/09/2021 NEGATIVE  NEGATIVE Final   Performed at [facility] [address], [place], [place] [address]   Specimen Description 09/09/2021 URINE, CLEAN CATCH   Final   Special Requests 09/09/2021 NONE   Final   Culture 09/09/2021  (A)   Final                   Value:<10,000 COLONIES/mL INSIGNIFICANT GROWTH Performed at [facility], [place] [address], [place], [place] [address]    Report Status 09/09/2021 09/10/2021 FINAL   Final   D-Dimer, Quant 09/09/2021 0.92 (H)  0.00 - 0.50 ug/mL-FEU Final   Comment: (NOTE) At the manufacturer cut-off value of 0.5 g/mL FEU, this assay has a negative predictive value of 95-100%.This assay is intended for use in conjunction with a clinical pretest probability (PTP) assessment model to exclude pulmonary embolism (PE) and deep venous thrombosis (DVT) in outpatients suspected of PE or DVT. Results should be correlated with clinical presentation. Performed at [facility], [place] [address] [place], [place] [address]    Troponin I (High Sensitivity) 09/09/2021 5  <18 ng/L Final   Comment: (NOTE) Elevated high sensitivity troponin I (hsTnI) values and significant  changes across serial measurements may suggest ACS but many other  chronic and acute conditions are known to elevate hsTnI results.  Refer to the "Links" section for chest pain algorithms and additional  guidance. Performed at [facility], [place] [address] [place], [place] [address]    BP 09/10/2021 135/83  mmHg  Final   S' Lateral 09/10/2021 2.30  cm Final   Area-P 1/2 09/10/2021 5.58  cm2 Final    Blood Alcohol level:  Lab Results  Component Value Date   ETH <10 11/23/2021   ETH <10 92/42/6834    Metabolic Disorder Labs: Lab Results  Component Value Date   HGBA1C 7.0 (H) 10/04/2021   MPG 154.2 10/04/2021   Lab Results  Component Value Date   PROLACTIN 3.8 (L) 11/23/2021   Lab Results  Component Value Date   CHOL 171 11/23/2021   TRIG 125 11/23/2021   HDL 65 11/23/2021   CHOLHDL 2.6 11/23/2021   VLDL 25 11/23/2021   LDLCALC 81 11/23/2021   LDLCALC 95 10/04/2021    Therapeutic Lab Levels: No results found for: "LITHIUM" Lab Results  Component Value Date   VALPROATE 54 12/03/2021   VALPROATE 45 (L) 11/09/2021   No results found for: "CBMZ"  Physical Findings   PHQ2-9    Flowsheet Row ED from 11/23/2021 in [facility]  PHQ-2 Total Score 2  PHQ-9 Total Score 3      Flowsheet Row ED from 11/23/2021  in [facility] Most recent reading at 11/23/2021  6:44 PM ED from 11/22/2021 in [place] [facility] Most recent reading at 11/22/2021  8:28 PM ED from 11/22/2021 in [place] [facility] Most recent reading at 11/22/2021  1:24 AM  C-SSRS RISK CATEGORY No Risk No Risk No Risk        Musculoskeletal  Strength & Muscle Tone: within normal limits Gait & Station: normal Patient leans: N/A  Psychiatric Specialty Exam  Presentation  General Appearance:  Disheveled  Eye Contact: Good  Speech: Clear and Coherent; Normal Rate  Speech Volume: Normal  Handedness: Right   Mood and Affect  Mood: Euthymic  Affect: Appropriate; Congruent   Thought Process  Thought Processes: Coherent; Goal Directed; Linear  Descriptions of Associations:Intact  Orientation:Full (Time, Place and Person)  Thought Content:Logical; WDL  Diagnosis of Schizophrenia or  Schizoaffective disorder in past: Yes  Duration of Psychotic Symptoms: No data recorded  Hallucinations:Hallucinations: None  Ideas of Reference:None  Suicidal Thoughts:Suicidal Thoughts: No  Homicidal Thoughts:Homicidal Thoughts: No   Sensorium  Memory: Immediate Good  Judgment: Fair  Insight: Present   Executive Functions  Concentration: Good  Attention Span: Good  Recall: Good  Fund of Knowledge: Fair  Language: Fair   Psychomotor Activity  Psychomotor Activity: Psychomotor Activity: Normal   Assets  Assets: Communication Skills; Desire for Improvement; Leisure Time; Resilience   Sleep  Sleep: Sleep: Good   Nutritional Assessment (For [facility] and [facility] admissions only) Has the patient had a weight loss or gain of 10 pounds or more in the last 3 months?: No Has the patient had a decrease in food intake/or appetite?: No Does the patient have dental problems?: No Does the patient have eating habits or behaviors that may be indicators of an eating disorder including binging or inducing vomiting?: No Has the patient recently lost weight without trying?: 0 Has the patient been eating poorly because of a decreased appetite?: 0 Malnutrition Screening Tool Score: 0    Physical Exam  Physical Exam Vitals and nursing note reviewed.  Constitutional:      Appearance: Normal appearance. She is well-developed.  HENT:     Head: Normocephalic and atraumatic.     Nose: Nose normal.  Cardiovascular:     Rate and Rhythm: Normal rate.  Pulmonary:     Effort: Pulmonary effort is normal.  Musculoskeletal:        General: Normal range of motion.     Cervical back: Normal range of motion.  Skin:    General: Skin is warm and dry.  Neurological:     Mental Status: She is alert and oriented to person, place, and time.  Psychiatric:        Attention and Perception: Attention and perception normal.        Mood and Affect: Mood and affect normal.        Speech:  Speech normal.        Behavior: Behavior normal. Behavior is cooperative.        Thought Content: Thought content normal.        Cognition and Memory: Cognition and memory normal.    Review of Systems  Constitutional: Negative.   HENT: Negative.    Eyes: Negative.   Respiratory: Negative.    Cardiovascular: Negative.   Gastrointestinal: Negative.   Genitourinary: Negative.   Musculoskeletal: Negative.   Skin: Negative.   Neurological: Negative.   Psychiatric/Behavioral: Negative.     Blood pressure 126/75,  pulse 96, temperature 97.6 F (36.4 C), temperature source Oral, resp. rate 18, SpO2 98 %. There is no height or weight on file to calculate BMI.  Treatment Plan Summary: Discontinue mupifocin ointment ordered for right foot abrasion TID. Right foot skin intact, no abrasion present. Patient denies discomfort.   Patient reviewed with Dr. Hampton Abbot.  Teresa Murillo remains cleared by psychiatry.  Facility [facility] team along with [facility] care manager and case manager and Adult [employer] representative continue to seek placement for Teresa Murillo.  Lucky Rathke, FNP 12/13/2021 8:25 AM

## 2021-12-14 ENCOUNTER — Encounter (HOSPITAL_COMMUNITY): Payer: Self-pay | Admitting: Student

## 2021-12-14 DIAGNOSIS — R4183 Borderline intellectual functioning: Secondary | ICD-10-CM

## 2021-12-14 DIAGNOSIS — F209 Schizophrenia, unspecified: Secondary | ICD-10-CM | POA: Diagnosis not present

## 2021-12-14 LAB — GLUCOSE, CAPILLARY
Glucose-Capillary: 103 mg/dL — ABNORMAL HIGH (ref 70–99)
Glucose-Capillary: 137 mg/dL — ABNORMAL HIGH (ref 70–99)
Glucose-Capillary: 100 mg/dL — ABNORMAL HIGH (ref 70–99)

## 2021-12-14 NOTE — ED Notes (Signed)
Pt sleeping@this time. Breathing even and unlabored. Will continue to monitor for sfatey 

## 2021-12-14 NOTE — ED Notes (Addendum)
Pt is sleeping at present. No distress noted. Will continue to monitor for safety. 

## 2021-12-14 NOTE — ED Provider Notes (Signed)
Behavioral Health Progress Note  Date and Time: 12/14/2021 9:54 AM Name: Chabely Norby MRN:  [id number]  HPI: Marieelena Bartko is a 61 y.o. female with a past psychiatric history of schizophrenia, aggressive behavior, and possible intellectual disability presenting to [facility] on 11/23/21 voluntarily as a walk in via [facility] with complaints that she was locked out of the group home. Patient has been dismissed from group home due to multiple elopements. Pt had already been discharged from this facility but had been living there temporarily while DSS was looking for new placement. Pt is boarding at [facility].  Completed an AID to Capacity Evaluation (ACE) with attending Dr. Dwyane Dee on 12/11/2021.  Please see media tab for full details.   Subjective:  Patient was initially seen sitting up, eating breakfast, comfortable, alert, no acute distress.  Stated she is still ready to leave this place.  She has no questions or concerns, no somatic complaints.  KD:TOIZTIWP Thoughts: No (Contracted to safety) YK:DXIPJASNK Thoughts: No NLZ:[id number]: None Ideas of XBD:ZHGD   Mood: Euthymic Sleep:Good Appetite: Good   Review of Systems  Respiratory:  Negative for shortness of breath.   Cardiovascular:  Negative for chest pain.  Gastrointestinal:  Negative for nausea and vomiting.  Neurological:  Negative for dizziness and headaches.    Diagnosis:  Final diagnoses:  At risk for self care deficit  Noncompliance  Self-care deficit for medication administration  Schizophrenia, unspecified type ([place])    Total Time spent with patient: 20 minutes  Past Psychiatric History: Schizoaffective disorder Past Medical History:  Past Medical History:  Diagnosis Date   Chronic obstructive pulmonary disease (COPD) ([place])    Glaucoma    Hyperlipidemia    Hypertension    Iron deficiency    Schizoaffective disorder ([place])    Type 2 diabetes mellitus ([place])     Past Surgical History:  Procedure  Laterality Date   TUBAL LIGATION     Family History:  Family History  Problem Relation Age of Onset   Breast cancer Maternal Grandmother    Family Psychiatric  History: none reported Social History:  Social History   Substance and Sexual Activity  Alcohol Use Yes     Social History   Substance and Sexual Activity  Drug Use Not Currently    Social History   Socioeconomic History   Marital status: Divorced    Spouse name: Not on file   Number of children: Not on file   Years of education: Not on file   Highest education level: Not on file  Occupational History   Not on file  Tobacco Use   Smoking status: Every Day    Packs/day: 1.00    Types: Cigars, Cigarettes   Smokeless tobacco: Current  Vaping Use   Vaping Use: Never used  Substance and Sexual Activity   Alcohol use: Yes   Drug use: Not Currently   Sexual activity: Not Currently    Birth control/protection: Surgical  Other Topics Concern   Not on file  Social History Narrative   Not on file   Social Determinants of Health   Financial Resource Strain: Not on file  Food Insecurity: Not on file  Transportation Needs: Not on file  Physical Activity: Not on file  Stress: Not on file  Social Connections: Not on file   SDOH:  SDOH Screenings   Depression (PHQ2-9): Low Risk  (11/23/2021)  Tobacco Use: High Risk (12/07/2021)   Additional Social History:    Pain Medications: See MAR Prescriptions: See [facility]  Over the Counter: See MAR History of alcohol / drug use?: No history of alcohol / drug abuse Longest period of sobriety (when/how long): N/A                    Current Medications:  Current Facility-Administered Medications  Medication Dose Route Frequency Provider Last Rate Last Admin   acetaminophen (TYLENOL) tablet 650 mg  650 mg Oral Q6H PRN Rankin, Shuvon B, NP   650 mg at 12/13/21 2354   albuterol (VENTOLIN HFA) 108 (90 Base) MCG/ACT inhaler 2 puff  2 puff Inhalation Q6H PRN Rankin,  Shuvon B, NP   2 puff at 12/09/21 2102   alum & mag hydroxide-simeth (MAALOX/MYLANTA) 200-200-20 MG/5ML suspension 30 mL  30 mL Oral Q4H PRN Rankin, Shuvon B, NP       apixaban (ELIQUIS) tablet 5 mg  5 mg Oral BID Rankin, Shuvon B, NP   5 mg at 12/14/21 0836   cloZAPine (CLOZARIL) tablet 50 mg  50 mg Oral Daily [name], NP   50 mg at 12/14/21 0835   cloZAPine (CLOZARIL) tablet 75 mg  75 mg Oral QHS [name], NP   75 mg at 12/13/21 2112   diltiazem (CARDIZEM CD) 24 hr capsule 240 mg  240 mg Oral Daily Rankin, Shuvon B, NP   240 mg at 12/14/21 0836   divalproex (DEPAKOTE ER) 24 hr tablet 500 mg  500 mg Oral BID Rankin, Shuvon B, NP   500 mg at 12/14/21 0839   fluticasone furoate-vilanterol (BREO ELLIPTA) 200-25 MCG/ACT 1 puff  1 puff Inhalation Daily Rankin, Shuvon B, NP   1 puff at 12/14/21 0834   haloperidol (HALDOL) tablet 10 mg  10 mg Oral Q8H PRN Rankin, Shuvon B, NP       insulin aspart (novoLOG) injection 0-9 Units  0-9 Units Subcutaneous TID WC Tharon Aquas, NP   1 Units at 12/13/21 1218   insulin glargine-yfgn (SEMGLEE) injection 12 Units  12 Units Subcutaneous Daily Rankin, Shuvon B, NP   12 Units at 12/14/21 0836   latanoprost (XALATAN) 0.005 % ophthalmic solution 1 drop  1 drop Both Eyes QHS Rankin, Shuvon B, NP   1 drop at 12/13/21 2116   magnesium hydroxide (MILK OF MAGNESIA) suspension 30 mL  30 mL Oral Daily PRN Rankin, Shuvon B, NP       menthol-cetylpyridinium (CEPACOL) lozenge 3 mg  1 lozenge Oral Once Onuoha, Chinwendu V, NP       metFORMIN (GLUCOPHAGE-XR) 24 hr tablet 750 mg  750 mg Oral BID WC [name], DO   750 mg at 12/14/21 5361   nicotine (NICODERM CQ - dosed in mg/24 hr) patch 7 mg  7 mg Transdermal Daily Rankin, Shuvon B, NP   7 mg at 12/14/21 4431   Vitamin D (Ergocalciferol) (DRISDOL) 1.25 MG (50000 UNIT) capsule 50,000 Units  50,000 Units Oral Q7 days Rankin, Shuvon B, NP   50,000 Units at 12/10/21 5400   Current Outpatient Medications   Medication Sig Dispense Refill   Accu-Chek Softclix Lancets lancets Use as directed up to four times daily 100 each 0   apixaban (ELIQUIS) 5 MG TABS tablet Take 1 tablet (5 mg total) by mouth 2 (two) times daily. 60 tablet 0   ARIPiprazole (ABILIFY) 10 MG tablet Take 1 tablet (10 mg total) by mouth daily. 30 tablet 0   Blood Glucose Monitoring Suppl (ACCU-CHEK GUIDE) w/Device KIT Use as directed up to four times daily 1 kit  0   budesonide-formoterol (SYMBICORT) 160-4.5 MCG/ACT inhaler Inhale 2 puffs into the lungs in the morning and at bedtime.     Cholecalciferol (VITAMIN D3) 1.25 MG (50000 UT) CAPS Take 50,000 Units by mouth every Thursday.     cloZAPine (CLOZARIL) 25 MG tablet Take 3 tablets (75 mg total) by mouth at bedtime. 90 tablet 0   clozapine (CLOZARIL) 50 MG tablet Take 1 tablet (50 mg total) by mouth daily. 30 tablet 0   diltiazem (CARDIZEM CD) 240 MG 24 hr capsule Take 1 capsule (240 mg total) by mouth daily. (Patient not taking: Reported on 11/24/2021) 30 capsule 0   divalproex (DEPAKOTE ER) 500 MG 24 hr tablet Take 1 tablet (500 mg total) by mouth 2 (two) times daily. 60 tablet 0   glucose blood test strip Use as directed up to four times daily 50 each 0   haloperidol (HALDOL) 10 MG tablet Take 1 tablet (10 mg total) by mouth 3 (three) times daily as needed for agitation (and psychotic symptoms).     INGREZZA 40 MG capsule Take 1 capsule (40 mg total) by mouth in the morning. 30 capsule 0   insulin aspart (NOVOLOG) 100 UNIT/ML FlexPen Before each meal 3 times a day, 140-199 - 2 units, 200-250 - 4 units, 251-299 - 6 units,  300-349 - 8 units,  350 or above 10 units. Insulin PEN if approved, provide syringes and needles if needed.Please switch to any approved short acting Insulin if needed. 15 mL 0   insulin glargine (LANTUS) 100 UNIT/ML Solostar Pen Inject 12 Units into the skin daily. 15 mL 0   Insulin Pen Needle 32G X 4 MM MISC Use 4 times a day with insulin, 1 month supply. 100  each 0   latanoprost (XALATAN) 0.005 % ophthalmic solution Place 1 drop into both eyes at bedtime.     metFORMIN (GLUCOPHAGE) 500 MG tablet Take 500 mg by mouth 2 (two) times daily with a meal.     nicotine (NICODERM CQ - DOSED IN MG/24 HR) 7 mg/24hr patch Place 1 patch (7 mg total) onto the skin daily. 28 patch 0   PROAIR HFA 108 (90 Base) MCG/ACT inhaler Inhale 2 puffs into the lungs every 6 (six) hours as needed for wheezing or shortness of breath.      Labs  Lab Results:  No results displayed because visit has over 200 results.    Admission on 11/22/2021, Discharged on 11/22/2021  Component Date Value Ref Range Status   Glucose-Capillary 11/22/2021 169 (H)  70 - 99 mg/dL Final   Glucose reference range applies only to samples taken after fasting for at least 8 hours.  No results displayed because visit has over 200 results.    Admission on 10/04/2021, Discharged on 10/22/2021  Component Date Value Ref Range Status   SARS Coronavirus 2 by RT PCR 10/04/2021 NEGATIVE  NEGATIVE Final   Comment: (NOTE) SARS-CoV-2 target nucleic acids are NOT DETECTED.  The SARS-CoV-2 RNA is generally detectable in upper respiratory specimens during the acute phase of infection. The lowest concentration of SARS-CoV-2 viral copies this assay can detect is 138 copies/mL. A negative result does not preclude SARS-Cov-2 infection and should not be used as the sole basis for treatment or other patient management decisions. A negative result may occur with  improper specimen collection/handling, submission of specimen other than nasopharyngeal swab, presence of viral mutation(s) within the areas targeted by this assay, and inadequate number of viral copies(<138 copies/mL). A negative result  must be combined with clinical observations, patient history, and epidemiological information. The expected result is Negative.  Fact Sheet for Patients:  EntrepreneurPulse.com.au  Fact Sheet for  Healthcare Providers:  IncredibleEmployment.be  This test is no                          t yet approved or cleared by the [place] FDA and  has been authorized for detection and/or diagnosis of SARS-CoV-2 by FDA under an Emergency Use Authorization (EUA). This EUA will remain  in effect (meaning this test can be used) for the duration of the COVID-19 declaration under Section 564(b)(1) of the Act, 21 U.S.C.section 360bbb-3(b)(1), unless the authorization is terminated  or revoked sooner.       Influenza A by PCR 10/04/2021 NEGATIVE  NEGATIVE Final   Influenza B by PCR 10/04/2021 NEGATIVE  NEGATIVE Final   Comment: (NOTE) The Xpert Xpress SARS-CoV-2/FLU/RSV plus assay is intended as an aid in the diagnosis of influenza from Nasopharyngeal swab specimens and should not be used as a sole basis for treatment. Nasal washings and aspirates are unacceptable for Xpert Xpress SARS-CoV-2/FLU/RSV testing.  Fact Sheet for Patients: EntrepreneurPulse.com.au  Fact Sheet for Healthcare Providers: IncredibleEmployment.be  This test is not yet approved or cleared by the [place] FDA and has been authorized for detection and/or diagnosis of SARS-CoV-2 by FDA under an Emergency Use Authorization (EUA). This EUA will remain in effect (meaning this test can be used) for the duration of the COVID-19 declaration under Section 564(b)(1) of the Act, 21 U.S.C. section 360bbb-3(b)(1), unless the authorization is terminated or revoked.  Performed at [facility], [place] [address] [place], [place] [address]    WBC 10/04/2021 8.4  4.0 - 10.5 K/uL Final   RBC 10/04/2021 4.42  3.87 - 5.11 MIL/uL Final   Hemoglobin 10/04/2021 11.6 (L)  12.0 - 15.0 g/dL Final   HCT 10/04/2021 35.7 (L)  36.0 - 46.0 % Final   MCV 10/04/2021 80.8  80.0 - 100.0 fL Final   MCH 10/04/2021 26.2  26.0 - 34.0 pg Final   MCHC 10/04/2021 32.5  30.0 - 36.0 g/dL  Final   RDW 10/04/2021 16.0 (H)  11.5 - 15.5 % Final   Platelets 10/04/2021 372  150 - 400 K/uL Final   nRBC 10/04/2021 0.0  0.0 - 0.2 % Final   Neutrophils Relative % 10/04/2021 68  % Final   Neutro Abs 10/04/2021 5.7  1.7 - 7.7 K/uL Final   Lymphocytes Relative 10/04/2021 22  % Final   Lymphs Abs 10/04/2021 1.8  0.7 - 4.0 K/uL Final   Monocytes Relative 10/04/2021 8  % Final   Monocytes Absolute 10/04/2021 0.7  0.1 - 1.0 K/uL Final   Eosinophils Relative 10/04/2021 1  % Final   Eosinophils Absolute 10/04/2021 0.1  0.0 - 0.5 K/uL Final   Basophils Relative 10/04/2021 1  % Final   Basophils Absolute 10/04/2021 0.1  0.0 - 0.1 K/uL Final   Immature Granulocytes 10/04/2021 0  % Final   Abs Immature Granulocytes 10/04/2021 0.03  0.00 - 0.07 K/uL Final   Performed at [facility], [place] [address], [place], [place] [address]   Sodium 10/04/2021 136  135 - 145 mmol/L Final   Potassium 10/04/2021 4.2  3.5 - 5.1 mmol/L Final   Chloride 10/04/2021 104  98 - 111 mmol/L Final   CO2 10/04/2021 25  22 - 32 mmol/L Final  Glucose, Bld 10/04/2021 100 (H)  70 - 99 mg/dL Final   Glucose reference range applies only to samples taken after fasting for at least 8 hours.   BUN 10/04/2021 9  6 - 20 mg/dL Final   Creatinine, Ser 10/04/2021 0.52  0.44 - 1.00 mg/dL Final   Calcium 10/04/2021 9.0  8.9 - 10.3 mg/dL Final   Total Protein 10/04/2021 7.0  6.5 - 8.1 g/dL Final   Albumin 10/04/2021 3.2 (L)  3.5 - 5.0 g/dL Final   AST 10/04/2021 13 (L)  15 - 41 U/L Final   ALT 10/04/2021 10  0 - 44 U/L Final   Alkaline Phosphatase 10/04/2021 61  38 - 126 U/L Final   Total Bilirubin 10/04/2021 0.3  0.3 - 1.2 mg/dL Final   GFR, Estimated 10/04/2021 >60  >60 mL/min Final   Comment: (NOTE) Calculated using the CKD-EPI Creatinine Equation (2021)    Anion gap 10/04/2021 7  5 - 15 Final   Performed at [facility] [address], [place], [place] [address]   Hgb A1c MFr Bld 10/04/2021 7.0 (H)  4.8 - 5.6 %  Final   Comment: (NOTE) Pre diabetes:          5.7%-6.4%  Diabetes:              >6.4%  Glycemic control for   <7.0% adults with diabetes    Mean Plasma Glucose 10/04/2021 154.2  mg/dL Final   Performed at [facility], [place] [address] [place], [place] [address]   Cholesterol 10/04/2021 178  0 - 200 mg/dL Final   Triglycerides 10/04/2021 155 (H)  <150 mg/dL Final   HDL 10/04/2021 52  >40 mg/dL Final   Total CHOL/HDL Ratio 10/04/2021 3.4  RATIO Final   VLDL 10/04/2021 31  0 - 40 mg/dL Final   LDL Cholesterol 10/04/2021 95  0 - 99 mg/dL Final   Comment:        Total Cholesterol/HDL:CHD Risk Coronary Heart Disease Risk Table                     Men   Women  1/2 Average Risk   3.4   3.3  Average Risk       5.0   4.4  2 X Average Risk   9.6   7.1  3 X Average Risk  23.4   11.0        Use the calculated Patient Ratio above and the CHD Risk Table to determine the patient's CHD Risk.        ATP III CLASSIFICATION (LDL):  <100     mg/dL   Optimal  100-129  mg/dL   Near or Above                    Optimal  130-159  mg/dL   Borderline  160-189  mg/dL   High  >190     mg/dL   Very High Performed at [facility] [address], [place], [place] [address]    POC Amphetamine UR 10/04/2021 None Detected  NONE DETECTED (Cut Off Level 1000 ng/mL) Final   POC Secobarbital (BAR) 10/04/2021 None Detected  NONE DETECTED (Cut Off Level 300 ng/mL) Final   POC Buprenorphine (BUP) 10/04/2021 None Detected  NONE DETECTED (Cut Off Level 10 ng/mL) Final   POC Oxazepam (BZO) 10/04/2021 None Detected  NONE DETECTED (Cut Off Level 300 ng/mL) Final   POC Cocaine UR  10/04/2021 None Detected  NONE DETECTED (Cut Off Level 300 ng/mL) Final   POC Methamphetamine UR 10/04/2021 None Detected  NONE DETECTED (Cut Off Level 1000 ng/mL) Final   POC Morphine 10/04/2021 None Detected  NONE DETECTED (Cut Off Level 300 ng/mL) Final   POC Methadone UR 10/04/2021 None Detected  NONE DETECTED (Cut Off Level  300 ng/mL) Final   POC Oxycodone UR 10/04/2021 Positive (A)  NONE DETECTED (Cut Off Level 100 ng/mL) Final   POC Marijuana UR 10/04/2021 None Detected  NONE DETECTED (Cut Off Level 50 ng/mL) Final   SARSCOV2ONAVIRUS 2 AG 10/04/2021 NEGATIVE  NEGATIVE Final   Comment: (NOTE) SARS-CoV-2 antigen NOT DETECTED.   Negative results are presumptive.  Negative results do not preclude SARS-CoV-2 infection and should not be used as the sole basis for treatment or other patient management decisions, including infection  control decisions, particularly in the presence of clinical signs and  symptoms consistent with COVID-19, or in those who have been in contact with the virus.  Negative results must be combined with clinical observations, patient history, and epidemiological information. The expected result is Negative.  Fact Sheet for Patients: HandmadeRecipes.com.cy  Fact Sheet for Healthcare Providers: FuneralLife.at  This test is not yet approved or cleared by the [place] FDA and  has been authorized for detection and/or diagnosis of SARS-CoV-2 by FDA under an Emergency Use Authorization (EUA).  This EUA will remain in effect (meaning this test can be used) for the duration of  the COV                          ID-19 declaration under Section 564(b)(1) of the Act, 21 U.S.C. section 360bbb-3(b)(1), unless the authorization is terminated or revoked sooner.     Valproic Acid Lvl 10/04/2021 51  50.0 - 100.0 ug/mL Final   Performed at [facility] [address], [place], [place] [address]   Valproic Acid Lvl 10/08/2021 57  50.0 - 100.0 ug/mL Final   Performed at [facility] [address], [place], [place] [address]   Glucose-Capillary 10/09/2021 123 (H)  70 - 99 mg/dL Final   Glucose reference range applies only to samples taken after fasting for at least 8 hours.  Admission on 09/09/2021, Discharged on 09/10/2021  Component  Date Value Ref Range Status   Sodium 09/09/2021 134 (L)  135 - 145 mmol/L Final   Potassium 09/09/2021 4.3  3.5 - 5.1 mmol/L Final   Chloride 09/09/2021 99  98 - 111 mmol/L Final   CO2 09/09/2021 25  22 - 32 mmol/L Final   Glucose, Bld 09/09/2021 107 (H)  70 - 99 mg/dL Final   Glucose reference range applies only to samples taken after fasting for at least 8 hours.   BUN 09/09/2021 12  6 - 20 mg/dL Final   Creatinine, Ser 09/09/2021 0.74  0.44 - 1.00 mg/dL Final   Calcium 09/09/2021 9.0  8.9 - 10.3 mg/dL Final   Total Protein 09/09/2021 7.4  6.5 - 8.1 g/dL Final   Albumin 09/09/2021 3.1 (L)  3.5 - 5.0 g/dL Final   AST 09/09/2021 13 (L)  15 - 41 U/L Final   ALT 09/09/2021 13  0 - 44 U/L Final   Alkaline Phosphatase 09/09/2021 66  38 - 126 U/L Final   Total Bilirubin 09/09/2021 0.2 (L)  0.3 - 1.2 mg/dL Final   GFR, Estimated 09/09/2021 >60  >60 mL/min Final   Comment: (  NOTE) Calculated using the CKD-EPI Creatinine Equation (2021)    Anion gap 09/09/2021 10  5 - 15 Final   Performed at [facility], [place] [address] [place], [place] [address]   WBC 09/09/2021 8.5  4.0 - 10.5 K/uL Final   RBC 09/09/2021 4.53  3.87 - 5.11 MIL/uL Final   Hemoglobin 09/09/2021 11.8 (L)  12.0 - 15.0 g/dL Final   HCT 09/09/2021 38.0  36.0 - 46.0 % Final   MCV 09/09/2021 83.9  80.0 - 100.0 fL Final   MCH 09/09/2021 26.0  26.0 - 34.0 pg Final   MCHC 09/09/2021 31.1  30.0 - 36.0 g/dL Final   RDW 09/09/2021 17.8 (H)  11.5 - 15.5 % Final   Platelets 09/09/2021 442 (H)  150 - 400 K/uL Final   nRBC 09/09/2021 0.0  0.0 - 0.2 % Final   Neutrophils Relative % 09/09/2021 70  % Final   Neutro Abs 09/09/2021 5.9  1.7 - 7.7 K/uL Final   Lymphocytes Relative 09/09/2021 20  % Final   Lymphs Abs 09/09/2021 1.7  0.7 - 4.0 K/uL Final   Monocytes Relative 09/09/2021 8  % Final   Monocytes Absolute 09/09/2021 0.7  0.1 - 1.0 K/uL Final   Eosinophils Relative 09/09/2021 1  % Final   Eosinophils Absolute 09/09/2021 0.1   0.0 - 0.5 K/uL Final   Basophils Relative 09/09/2021 1  % Final   Basophils Absolute 09/09/2021 0.1  0.0 - 0.1 K/uL Final   Immature Granulocytes 09/09/2021 0  % Final   Abs Immature Granulocytes 09/09/2021 0.03  0.00 - 0.07 K/uL Final   Performed at [facility], [place] [address], [place], [place] [address]   Ammonia 09/09/2021 37 (H)  9 - 35 umol/L Final   Comment: HEMOLYSIS AT THIS LEVEL MAY AFFECT RESULT Performed at [facility], [place] [address], [place], [place] [address]    Opiates 09/09/2021 NONE DETECTED  NONE DETECTED Final   Cocaine 09/09/2021 NONE DETECTED  NONE DETECTED Final   Benzodiazepines 09/09/2021 NONE DETECTED  NONE DETECTED Final   Amphetamines 09/09/2021 NONE DETECTED  NONE DETECTED Final   Tetrahydrocannabinol 09/09/2021 NONE DETECTED  NONE DETECTED Final   Barbiturates 09/09/2021 NONE DETECTED  NONE DETECTED Final   Comment: (NOTE) DRUG SCREEN FOR MEDICAL PURPOSES ONLY.  IF CONFIRMATION IS NEEDED FOR ANY PURPOSE, NOTIFY LAB WITHIN 5 DAYS.  LOWEST DETECTABLE LIMITS FOR URINE DRUG SCREEN Drug Class                     Cutoff (ng/mL) Amphetamine and metabolites    1000 Barbiturate and metabolites    200 Benzodiazepine                 962 Tricyclics and metabolites     300 Opiates and metabolites        300 Cocaine and metabolites        300 THC                            50 Performed at [facility], [place] [address] [place], [place] [address]    Alcohol, Ethyl (B) 09/09/2021 <10  <10 mg/dL Final   Comment: (NOTE) Lowest detectable limit for serum alcohol is 10 mg/dL.  For medical purposes only. Performed at [facility], [place] [address], [place],  [address]    Lipase 09/09/2021 33  11 - 51  U/L Final   Performed at [facility], [place] [address] [place], [place] [address]   Color, Urine 09/09/2021 COLORLESS (A)  YELLOW Final   APPearance 09/09/2021 CLEAR  CLEAR Final   Specific Gravity, Urine 09/09/2021 1.002 (L)   1.005 - 1.030 Final   pH 09/09/2021 6.0  5.0 - 8.0 Final   Glucose, UA 09/09/2021 NEGATIVE  NEGATIVE mg/dL Final   Hgb urine dipstick 09/09/2021 NEGATIVE  NEGATIVE Final   Bilirubin Urine 09/09/2021 NEGATIVE  NEGATIVE Final   Ketones, ur 09/09/2021 NEGATIVE  NEGATIVE mg/dL Final   Protein, ur 09/09/2021 NEGATIVE  NEGATIVE mg/dL Final   Nitrite 09/09/2021 NEGATIVE  NEGATIVE Final   Leukocytes,Ua 09/09/2021 NEGATIVE  NEGATIVE Final   Performed at [facility] [address] [place], [place] [address]   Specimen Description 09/09/2021 URINE, CLEAN CATCH   Final   Special Requests 09/09/2021 NONE   Final   Culture 09/09/2021  (A)   Final                   Value:<10,000 COLONIES/mL INSIGNIFICANT GROWTH Performed at [facility], [place] [address], [place], [place] [address]    Report Status 09/09/2021 09/10/2021 FINAL   Final   D-Dimer, Quant 09/09/2021 0.92 (H)  0.00 - 0.50 ug/mL-FEU Final   Comment: (NOTE) At the manufacturer cut-off value of 0.5 g/mL FEU, this assay has a negative predictive value of 95-100%.This assay is intended for use in conjunction with a clinical pretest probability (PTP) assessment model to exclude pulmonary embolism (PE) and deep venous thrombosis (DVT) in outpatients suspected of PE or DVT. Results should be correlated with clinical presentation. Performed at [facility], [place] [address] [place], [place] [address]    Troponin I (High Sensitivity) 09/09/2021 5  <18 ng/L Final   Comment: (NOTE) Elevated high sensitivity troponin I (hsTnI) values and significant  changes across serial measurements may suggest ACS but many other  chronic and acute conditions are known to elevate hsTnI results.  Refer to the "Links" section for chest pain algorithms and additional  guidance. Performed at [facility], [place] [address] [place], [place] [address]    BP 09/10/2021 135/83  mmHg Final   S' Lateral 09/10/2021 2.30  cm Final   Area-P 1/2  09/10/2021 5.58  cm2 Final    Blood Alcohol level:  Lab Results  Component Value Date   ETH <10 11/23/2021   ETH <10 25/63/8937    Metabolic Disorder Labs: Lab Results  Component Value Date   HGBA1C 7.0 (H) 10/04/2021   MPG 154.2 10/04/2021   Lab Results  Component Value Date   PROLACTIN 3.8 (L) 11/23/2021   Lab Results  Component Value Date   CHOL 171 11/23/2021   TRIG 125 11/23/2021   HDL 65 11/23/2021   CHOLHDL 2.6 11/23/2021   VLDL 25 11/23/2021   LDLCALC 81 11/23/2021   LDLCALC 95 10/04/2021    Therapeutic Lab Levels: No results found for: "LITHIUM" Lab Results  Component Value Date   VALPROATE 54 12/03/2021   VALPROATE 45 (L) 11/09/2021   No results found for: "CBMZ"  Physical Findings   PHQ2-9    Flowsheet Row ED from 11/23/2021 in [facility]  PHQ-2 Total Score 2  PHQ-9 Total Score 3      Flowsheet Row ED from [facility]23/2021 in [facility] Ctr Most recent reading at 11/23/2021  6:44 PM ED from 11/22/2021 in  [place] DEPT Most recent reading at 11/22/2021  8:28 PM ED from 11/22/2021 in [place] DEPT Most recent reading at 11/22/2021  1:24 AM  C-SSRS RISK CATEGORY No Risk No Risk No Risk        Musculoskeletal  Strength & Muscle Tone: within normal limits Gait & Station: normal Patient leans: N/A  Psychiatric Specialty Exam  Presentation  General Appearance:  Appropriate for Environment; Casual; Disheveled  Eye Contact: Good  Speech: Clear and Coherent; Normal Rate  Speech Volume: Decreased  Handedness: Right   Mood and Affect  Mood: Euthymic  Affect: Appropriate; Congruent; Constricted   Thought Process  Thought Processes: Coherent; Goal Directed; Linear  Descriptions of Associations:Intact  Orientation:Full (Time, Place and Person)  Thought Content:WDL  Diagnosis of Schizophrenia or Schizoaffective  disorder in past: Yes  Hallucinations:Hallucinations: None  Ideas of Reference:None  Suicidal Thoughts:Suicidal Thoughts: No (Contracted to safety)  Homicidal Thoughts:Homicidal Thoughts: No   Sensorium  Memory: Immediate Fair  Judgment: Fair  Insight: Shallow   Executive Functions  Concentration: Good  Attention Span: Good  Recall: Good  Fund of Knowledge: Fair  Language: Good   Psychomotor Activity  Psychomotor Activity: Psychomotor Activity: Normal   Assets  Assets: Communication Skills; Desire for Improvement   Sleep  Sleep: Sleep: Good  Physical Exam  Physical Exam Vitals and nursing note reviewed.  Constitutional:      General: She is not in acute distress.    Appearance: She is not ill-appearing or diaphoretic.  HENT:     Head: Normocephalic and atraumatic.  Pulmonary:     Effort: Pulmonary effort is normal. No respiratory distress.  Neurological:     General: No focal deficit present.     Mental Status: She is alert.     Blood pressure 114/78, pulse 74, temperature 98.5 F (36.9 C), temperature source Oral, resp. rate 16, SpO2 96 %. There is no height or weight on file to calculate BMI.  Treatment Plan Summary: Aissa remains cleared by psychiatry.   Facility [facility] team along with [facility] health care manager and case manager and Adult [employer] representative continue to seek placement for [place].  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 apixaban, 5 mg, BID clozapine, 50 mg, Daily cloZAPine, 75 mg, QHS diltiazem, 240 mg, Daily divalproex, 500 mg, BID fluticasone furoate-vilanterol, 1 puff, Daily insulin aspart, 0-9 Units, TID WC insulin glargine-yfgn, 12 Units, Daily latanoprost, 1 drop, QHS menthol-cetylpyridinium, 1 lozenge, Once metFORMIN, 750 mg, BID WC nicotine, 7 mg, Daily Vitamin D (Ergocalciferol), 50,000 Units, Q7 days    [name], DO 12/14/2021 9:54 AM

## 2021-12-14 NOTE — ED Notes (Signed)
Pt sitting on her bed calm and cooperative. No c/o pain or distress. Will continue to monitor for safety

## 2021-12-14 NOTE — ED Notes (Signed)
Pt sleeping at present. No distress noted. Will continue to monitor.

## 2021-12-14 NOTE — ED Notes (Signed)
Pt was given a bowl of cereal and milk for breakfast.

## 2021-12-14 NOTE — Consult Note (Signed)
Psychology consult: Ms. Brauner was referred for intellectual testing to aid with diagnostic differential and discharge planning.  She consented to the administration of the Wechsler Adult Intelligence Scale-4.  Testing took place in a consult room at [facility] from 2:20 PM to 3:10 PM.  On the Wechsler Adult Intelligence Scale-4, Ms. Minder achieved a full-scale IQ score of 73 and a percentile rank of 4 placing her in the borderline range of intellectual functioning.   On the verbal comprehension index, she achieved a standard score of 74 and a percentile rank of 4 placing her in the borderline range of functioning.  Overall, she displayed borderline verbal abstract reasoning, lexical knowledge, and fund of general knowledge.  On the perceptual reasoning index, Ms. Faw achieved a standard score of 82 and a percentile rank of 12 placing her in the low average range of functioning.  Overall, she displayed low average visual/spatial processing skills and visual reasoning ability.  On the working memory index, Ms. Petsch achieved a standard score 71 and percentile rank of 3 placing her in the borderline range of functioning.  Her auditory working [employer], [employer] are in the borderline range.  She struggled to remember 1 piece of cognitive information while performing a second mental or cognitive task.  On the processing Speed index, Ms. Schlotterbeck achieved a standard score of 81 and a percentile rank of 10 placing her in the low average range of functioning.  She was noted to be left-handed with a standard tripod grip.  Her handwriting speed was slow and arduous.  Mental status: Her mood was mildly anxious.  Affect was broad and appropriate to mood.  Thoughts were clear, coherent, relevant and rational.  Speech was goal-directed but the content was sparse.  She was oriented to person place and time.  Judgment and insight appeared marginal.  Effort, for the most part, was adequate.  She did become easily  frustrated and asked to stop on several occasions.  She easily rejoined the testing process after a short break.  As such, the data are deemed a valid and reliable indicator of her current level of intellectual and cognitive functioning.  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  Other diagnoses per the medical record.  Plan: These data were shared with her treatment team including Dr. Dwyane Dee, Dr. Valarie Merino, and Dr. Alfonse Spruce.

## 2021-12-15 DIAGNOSIS — F209 Schizophrenia, unspecified: Secondary | ICD-10-CM | POA: Diagnosis not present

## 2021-12-15 LAB — CBC WITH DIFFERENTIAL/PLATELET
Abs Immature Granulocytes: 0.02 10*3/uL (ref 0.00–0.07)
Basophils Absolute: 0.1 10*3/uL (ref 0.0–0.1)
Basophils Relative: 1 %
Eosinophils Absolute: 0.1 10*3/uL (ref 0.0–0.5)
Eosinophils Relative: 2 %
HCT: 36.3 % (ref 36.0–46.0)
Hemoglobin: 11.7 g/dL — ABNORMAL LOW (ref 12.0–15.0)
Immature Granulocytes: 0 %
Lymphocytes Relative: 38 %
Lymphs Abs: 2.2 10*3/uL (ref 0.7–4.0)
MCH: 25.5 pg — ABNORMAL LOW (ref 26.0–34.0)
MCHC: 32.2 g/dL (ref 30.0–36.0)
MCV: 79.3 fL — ABNORMAL LOW (ref 80.0–100.0)
Monocytes Absolute: 0.5 10*3/uL (ref 0.1–1.0)
Monocytes Relative: 8 %
Neutro Abs: 3 10*3/uL (ref 1.7–7.7)
Neutrophils Relative %: 51 %
Platelets: 323 10*3/uL (ref 150–400)
RBC: 4.58 MIL/uL (ref 3.87–5.11)
RDW: 17.7 % — ABNORMAL HIGH (ref 11.5–15.5)
WBC: 5.8 10*3/uL (ref 4.0–10.5)
nRBC: 0 % (ref 0.0–0.2)

## 2021-12-15 LAB — GLUCOSE, CAPILLARY
Glucose-Capillary: 92 mg/dL (ref 70–99)
Glucose-Capillary: 100 mg/dL — ABNORMAL HIGH (ref 70–99)
Glucose-Capillary: 136 mg/dL — ABNORMAL HIGH (ref 70–99)

## 2021-12-15 MED ORDER — METFORMIN HCL ER 500 MG PO TB24
1000.0000 mg | ORAL_TABLET | Freq: Two times a day (BID) | ORAL | Status: DC
  Administered 2021-12-15 – 2022-01-23 (×78): 1000 mg via ORAL
  Filled 2021-12-15 (×80): qty 2

## 2021-12-15 MED ORDER — INSULIN GLARGINE-YFGN 100 UNIT/ML ~~LOC~~ SOLN
10.0000 [IU] | Freq: Every day | SUBCUTANEOUS | Status: DC
  Administered 2021-12-16 – 2022-01-13 (×29): 10 [IU] via SUBCUTANEOUS

## 2021-12-15 NOTE — ED Notes (Signed)
Pt sleeping@this time. Breathing even and unlabored. Will continue to monitor for safety 

## 2021-12-15 NOTE — ED Notes (Signed)
Pt sitting up in bed. Talking to peers.  No distress noted.  

## 2021-12-15 NOTE — Progress Notes (Signed)
TOC leadership met with [facility] regarding placement update on patient.  A Mini-Mental Status Exam (MMSE) is no longer needed because they were able to use the most recent Comprehensive Clinical Assessment (CCA) for placement purposes.  They are still requesting any documentation that speaks to the assessment of patient's capacity.  Currently 103 referrals have been sent for placement at this time.  This information was communicated to [place] for the [employer]. Teresa Murillo

## 2021-12-15 NOTE — ED Notes (Signed)
Pt given dinner meal 

## 2021-12-15 NOTE — ED Provider Notes (Signed)
Behavioral Health Progress Note  Date and Time: 12/15/2021 8:51 AM Name: Teresa Murillo MRN:  [id number]  HPI: Teresa Murillo is a 61 y.o. female with a past psychiatric history of schizophrenia, aggressive behavior, and possible intellectual disability presenting to [facility] on 11/23/21 voluntarily as a walk in via [facility] with complaints that she was locked out of the group home. Patient has been dismissed from group home due to multiple elopements. Pt had already been discharged from this facility but had been living there temporarily while DSS was looking for new placement. Pt is boarding at [facility].  Completed an AID to Capacity Evaluation (ACE) with attending Dr. Dwyane Dee on 12/11/2021.  Please see media tab for full details.   Completed test with [name], PhD: Wechsler Adult Intelligence Scale-4, Teresa Murillo achieved a full-scale IQ score of 73 and a percentile rank of 4 placing her in the borderline range of intellectual functioning (12/14/2021) Please see consult note from [name], PhD on 12/14/2021  Subjective:  Patient was initially seen asleep in bed, comfortably, awoken easily.  She denied SI/HI/AVH.  She denied side effects to current medications.  Stated she is ready to go leave.  She denied somatic concerns.  Patient denied any questions or concerns.  QV:[id number] Thoughts: No (Contracted to safety) EP:PIRJJOACZ Thoughts: No YSA:[id number]: None Ideas of DDU:KGUR   Mood: Euthymic Sleep:Good Appetite: Good   Review of Systems  Respiratory:  Negative for shortness of breath.   Cardiovascular:  Negative for chest pain.  Gastrointestinal:  Negative for nausea and vomiting.  Neurological:  Negative for dizziness and headaches.    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 On the Wechsler Adult Intelligence Scale-4, Teresa Murillo achieved a full-scale IQ score of 73 and a percentile rank of 4 placing her in the borderline range of intellectual functioning.    Chronic obstructive pulmonary disease (COPD) (HCC)    Glaucoma    Hyperlipidemia    Hypertension    Iron deficiency    Schizoaffective disorder (HCC)    Type 2 diabetes mellitus (HCC)     Past Surgical History:  Procedure Laterality Date   TUBAL LIGATION     Family History:  Family History  Problem Relation Age of Onset   Breast cancer Maternal Grandmother    Family Psychiatric  History: none reported Social History:  Social History   Substance and Sexual Activity  Alcohol Use Yes     Social History   Substance and Sexual Activity  Drug Use Not Currently    Social History   Socioeconomic History   Marital status: Divorced    Spouse name: Not on file   Number of children: Not on file   Years of education: Not on file   Highest education level: Not on file  Occupational History   Not on file  Tobacco Use   Smoking status: Every Day    Packs/day: 1.00    Types: Cigars, Cigarettes   Smokeless tobacco: Current  Vaping Use   Vaping Use: Never used  Substance and Sexual Activity   Alcohol use: Yes   Drug use: Not Currently   Sexual activity: Not Currently  Birth control/protection: Surgical  Other Topics Concern   Not on file  Social History Narrative   Not on file   Social Determinants of Health   Financial Resource Strain: Not on file  Food Insecurity: Not on file  Transportation Needs: Not on file  Physical Activity: Not on file  Stress: Not on file  Social Connections: Not on file   SDOH:  SDOH Screenings   Depression (PHQ2-9): Low Risk   (11/23/2021)  Tobacco Use: High Risk (12/14/2021)   Additional Social History:    Pain Medications: See MAR Prescriptions: See MAR Over the Counter: See MAR History of alcohol / drug use?: No history of alcohol / drug abuse Longest period of sobriety (when/how long): N/A                    Current Medications:  Current Facility-Administered Medications  Medication Dose Route Frequency Provider Last Rate Last Admin   acetaminophen (TYLENOL) tablet 650 mg  650 mg Oral Q6H PRN Rankin, Shuvon B, NP   650 mg at 12/13/21 2354   albuterol (VENTOLIN HFA) 108 (90 Base) MCG/ACT inhaler 2 puff  2 puff Inhalation Q6H PRN Rankin, Shuvon B, NP   2 puff at 12/09/21 2102   alum & mag hydroxide-simeth (MAALOX/MYLANTA) 200-200-20 MG/5ML suspension 30 mL  30 mL Oral Q4H PRN Rankin, Shuvon B, NP       apixaban (ELIQUIS) tablet 5 mg  5 mg Oral BID Rankin, Shuvon B, NP   5 mg at 12/14/21 2129   cloZAPine (CLOZARIL) tablet 50 mg  50 mg Oral Daily [name], NP   50 mg at 12/14/21 0835   cloZAPine (CLOZARIL) tablet 75 mg  75 mg Oral QHS [name], NP   75 mg at 12/14/21 2127   diltiazem (CARDIZEM CD) 24 hr capsule 240 mg  240 mg Oral Daily Rankin, Shuvon B, NP   240 mg at 12/14/21 0836   divalproex (DEPAKOTE ER) 24 hr tablet 500 mg  500 mg Oral BID Rankin, Shuvon B, NP   500 mg at 12/14/21 2129   fluticasone furoate-vilanterol (BREO ELLIPTA) 200-25 MCG/ACT 1 puff  1 puff Inhalation Daily Rankin, Shuvon B, NP   1 puff at 12/15/21 0806   haloperidol (HALDOL) tablet 10 mg  10 mg Oral Q8H PRN Rankin, Shuvon B, NP       insulin aspart (novoLOG) injection 0-9 Units  0-9 Units Subcutaneous TID WC Tharon Aquas, NP   1 Units at 12/13/21 1218   insulin glargine-yfgn (SEMGLEE) injection 12 Units  12 Units Subcutaneous Daily Rankin, Shuvon B, NP   12 Units at 12/14/21 0836   latanoprost (XALATAN) 0.005 % ophthalmic solution 1 drop  1 drop Both Eyes QHS Rankin, Shuvon B, NP   1 drop at 12/14/21 2131    magnesium hydroxide (MILK OF MAGNESIA) suspension 30 mL  30 mL Oral Daily PRN Rankin, Shuvon B, NP       menthol-cetylpyridinium (CEPACOL) lozenge 3 mg  1 lozenge Oral Once Onuoha, Chinwendu V, NP       metFORMIN (GLUCOPHAGE-XR) 24 hr tablet 750 mg  750 mg Oral BID WC [name], DO   750 mg at 12/15/21 0804   nicotine (NICODERM CQ - dosed in mg/24 hr) patch 7 mg  7 mg Transdermal Daily Rankin, Shuvon B, NP   7 mg at 12/14/21 0836   Vitamin D (Ergocalciferol) (DRISDOL) 1.25 MG (50000 UNIT) capsule 50,000 Units  50,000 Units Oral Q7 days Rankin,  Shuvon B, NP   50,000 Units at 12/10/21 2878   Current Outpatient Medications  Medication Sig Dispense Refill   Accu-Chek Softclix Lancets lancets Use as directed up to four times daily 100 each 0   apixaban (ELIQUIS) 5 MG TABS tablet Take 1 tablet (5 mg total) by mouth 2 (two) times daily. 60 tablet 0   ARIPiprazole (ABILIFY) 10 MG tablet Take 1 tablet (10 mg total) by mouth daily. 30 tablet 0   Blood Glucose Monitoring Suppl (ACCU-CHEK GUIDE) w/Device KIT Use as directed up to four times daily 1 kit 0   budesonide-formoterol (SYMBICORT) 160-4.5 MCG/ACT inhaler Inhale 2 puffs into the lungs in the morning and at bedtime.     Cholecalciferol (VITAMIN D3) 1.25 MG (50000 UT) CAPS Take 50,000 Units by mouth every Thursday.     cloZAPine (CLOZARIL) 25 MG tablet Take 3 tablets (75 mg total) by mouth at bedtime. 90 tablet 0   clozapine (CLOZARIL) 50 MG tablet Take 1 tablet (50 mg total) by mouth daily. 30 tablet 0   diltiazem (CARDIZEM CD) 240 MG 24 hr capsule Take 1 capsule (240 mg total) by mouth daily. (Patient not taking: Reported on 11/24/2021) 30 capsule 0   divalproex (DEPAKOTE ER) 500 MG 24 hr tablet Take 1 tablet (500 mg total) by mouth 2 (two) times daily. 60 tablet 0   glucose blood test strip Use as directed up to four times daily 50 each 0   haloperidol (HALDOL) 10 MG tablet Take 1 tablet (10 mg total) by mouth 3 (three) times daily as needed for  agitation (and psychotic symptoms).     INGREZZA 40 MG capsule Take 1 capsule (40 mg total) by mouth in the morning. 30 capsule 0   insulin aspart (NOVOLOG) 100 UNIT/ML FlexPen Before each meal 3 times a day, 140-199 - 2 units, 200-250 - 4 units, 251-299 - 6 units,  300-349 - 8 units,  350 or above 10 units. Insulin PEN if approved, provide syringes and needles if needed.Please switch to any approved short acting Insulin if needed. 15 mL 0   insulin glargine (LANTUS) 100 UNIT/ML Solostar Pen Inject 12 Units into the skin daily. 15 mL 0   Insulin Pen Needle 32G X 4 MM MISC Use 4 times a day with insulin, 1 month supply. 100 each 0   latanoprost (XALATAN) 0.005 % ophthalmic solution Place 1 drop into both eyes at bedtime.     metFORMIN (GLUCOPHAGE) 500 MG tablet Take 500 mg by mouth 2 (two) times daily with a meal.     nicotine (NICODERM CQ - DOSED IN MG/24 HR) 7 mg/24hr patch Place 1 patch (7 mg total) onto the skin daily. 28 patch 0   PROAIR HFA 108 (90 Base) MCG/ACT inhaler Inhale 2 puffs into the lungs every 6 (six) hours as needed for wheezing or shortness of breath.      Labs  Lab Results:  No results displayed because visit has over 200 results.    Admission on 11/22/2021, Discharged on 11/22/2021  Component Date Value Ref Range Status   Glucose-Capillary 11/22/2021 169 (H)  70 - 99 mg/dL Final   Glucose reference range applies only to samples taken after fasting for at least 8 hours.  No results displayed because visit has over 200 results.    Admission on 10/04/2021, Discharged on 10/22/2021  Component Date Value Ref Range Status   SARS Coronavirus 2 by RT PCR 10/04/2021 NEGATIVE  NEGATIVE Final   Comment: (NOTE) SARS-CoV-2  target nucleic acids are NOT DETECTED.  The SARS-CoV-2 RNA is generally detectable in upper respiratory specimens during the acute phase of infection. The lowest concentration of SARS-CoV-2 viral copies this assay can detect is 138 copies/mL. A negative  result does not preclude SARS-Cov-2 infection and should not be used as the sole basis for treatment or other patient management decisions. A negative result may occur with  improper specimen collection/handling, submission of specimen other than nasopharyngeal swab, presence of viral mutation(s) within the areas targeted by this assay, and inadequate number of viral copies(<138 copies/mL). A negative result must be combined with clinical observations, patient history, and epidemiological information. The expected result is Negative.  Fact Sheet for Patients:  EntrepreneurPulse.com.au  Fact Sheet for Healthcare Providers:  IncredibleEmployment.be  This test is no                          t yet approved or cleared by the [place] FDA and  has been authorized for detection and/or diagnosis of SARS-CoV-2 by FDA under an Emergency Use Authorization (EUA). This EUA will remain  in effect (meaning this test can be used) for the duration of the COVID-19 declaration under Section 564(b)(1) of the Act, 21 U.S.C.section 360bbb-3(b)(1), unless the authorization is terminated  or revoked sooner.       Influenza A by PCR 10/04/2021 NEGATIVE  NEGATIVE Final   Influenza B by PCR 10/04/2021 NEGATIVE  NEGATIVE Final   Comment: (NOTE) The Xpert Xpress SARS-CoV-2/FLU/RSV plus assay is intended as an aid in the diagnosis of influenza from Nasopharyngeal swab specimens and should not be used as a sole basis for treatment. Nasal washings and aspirates are unacceptable for Xpert Xpress SARS-CoV-2/FLU/RSV testing.  Fact Sheet for Patients: EntrepreneurPulse.com.au  Fact Sheet for Healthcare Providers: IncredibleEmployment.be  This test is not yet approved or cleared by the [place] FDA and has been authorized for detection and/or diagnosis of SARS-CoV-2 by FDA under an Emergency Use Authorization (EUA). This EUA will  remain in effect (meaning this test can be used) for the duration of the COVID-19 declaration under Section 564(b)(1) of the Act, 21 U.S.C. section 360bbb-3(b)(1), unless the authorization is terminated or revoked.  Performed at [facility], [place] [address], [place], [place] [address]    WBC 10/04/2021 8.4  4.0 - 10.5 K/uL Final   RBC 10/04/2021 4.42  3.87 - 5.11 MIL/uL Final   Hemoglobin 10/04/2021 11.6 (L)  12.0 - 15.0 g/dL Final   HCT 10/04/2021 35.7 (L)  36.0 - 46.0 % Final   MCV 10/04/2021 80.8  80.0 - 100.0 fL Final   MCH 10/04/2021 26.2  26.0 - 34.0 pg Final   MCHC 10/04/2021 32.5  30.0 - 36.0 g/dL Final   RDW 10/04/2021 16.0 (H)  11.5 - 15.5 % Final   Platelets 10/04/2021 372  150 - 400 K/uL Final   nRBC 10/04/2021 0.0  0.0 - 0.2 % Final   Neutrophils Relative % 10/04/2021 68  % Final   Neutro Abs 10/04/2021 5.7  1.7 - 7.7 K/uL Final   Lymphocytes Relative 10/04/2021 22  % Final   Lymphs Abs 10/04/2021 1.8  0.7 - 4.0 K/uL Final   Monocytes Relative 10/04/2021 8  % Final   Monocytes Absolute 10/04/2021 0.7  0.1 - 1.0 K/uL Final   Eosinophils Relative 10/04/2021 1  % Final   Eosinophils Absolute 10/04/2021 0.1  0.0 - 0.5 K/uL Final   Basophils Relative 10/04/2021  1  % Final   Basophils Absolute 10/04/2021 0.1  0.0 - 0.1 K/uL Final   Immature Granulocytes 10/04/2021 0  % Final   Abs Immature Granulocytes 10/04/2021 0.03  0.00 - 0.07 K/uL Final   Performed at [facility] [address] [place], [place] [address]   Sodium 10/04/2021 136  135 - 145 mmol/L Final   Potassium 10/04/2021 4.2  3.5 - 5.1 mmol/L Final   Chloride 10/04/2021 104  98 - 111 mmol/L Final   CO2 10/04/2021 25  22 - 32 mmol/L Final   Glucose, Bld 10/04/2021 100 (H)  70 - 99 mg/dL Final   Glucose reference range applies only to samples taken after fasting for at least 8 hours.   BUN 10/04/2021 9  6 - 20 mg/dL Final   Creatinine, Ser 10/04/2021 0.52  0.44 - 1.00 mg/dL Final   Calcium  10/04/2021 9.0  8.9 - 10.3 mg/dL Final   Total Protein 10/04/2021 7.0  6.5 - 8.1 g/dL Final   Albumin 10/04/2021 3.2 (L)  3.5 - 5.0 g/dL Final   AST 10/04/2021 13 (L)  15 - 41 U/L Final   ALT 10/04/2021 10  0 - 44 U/L Final   Alkaline Phosphatase 10/04/2021 61  38 - 126 U/L Final   Total Bilirubin 10/04/2021 0.3  0.3 - 1.2 mg/dL Final   GFR, Estimated 10/04/2021 >60  >60 mL/min Final   Comment: (NOTE) Calculated using the CKD-EPI Creatinine Equation (2021)    Anion gap 10/04/2021 7  5 - 15 Final   Performed at [facility] [address], [place], [place] [address]   Hgb A1c MFr Bld 10/04/2021 7.0 (H)  4.8 - 5.6 % Final   Comment: (NOTE) Pre diabetes:          5.7%-6.4%  Diabetes:              >6.4%  Glycemic control for   <7.0% adults with diabetes    Mean Plasma Glucose 10/04/2021 154.2  mg/dL Final   Performed at [facility], [place] [address], [place],  [address]   Cholesterol 10/04/2021 178  0 - 200 mg/dL Final   Triglycerides 10/04/2021 155 (H)  <150 mg/dL Final   HDL 10/04/2021 52  >40 mg/dL Final   Total CHOL/HDL Ratio 10/04/2021 3.4  RATIO Final   VLDL 10/04/2021 31  0 - 40 mg/dL Final   LDL Cholesterol 10/04/2021 95  0 - 99 mg/dL Final   Comment:        Total Cholesterol/HDL:CHD Risk Coronary Heart Disease Risk Table                     Men   Women  1/2 Average Risk   3.4   3.3  Average Risk       5.0   4.4  2 X Average Risk   9.6   7.1  3 X Average Risk  23.4   11.0        Use the calculated Patient Ratio above and the CHD Risk Table to determine the patient's CHD Risk.        ATP III CLASSIFICATION (LDL):  <100     mg/dL   Optimal  100-129  mg/dL   Near or Above                    Optimal  130-159  mg/dL   Borderline  160-189  mg/dL  High  >190     mg/dL   Very High Performed at [facility] [address], [place], [place] [address]    POC Amphetamine UR 10/04/2021 None Detected  NONE DETECTED (Cut Off Level 1000 ng/mL) Final    POC Secobarbital (BAR) 10/04/2021 None Detected  NONE DETECTED (Cut Off Level 300 ng/mL) Final   POC Buprenorphine (BUP) 10/04/2021 None Detected  NONE DETECTED (Cut Off Level 10 ng/mL) Final   POC Oxazepam (BZO) 10/04/2021 None Detected  NONE DETECTED (Cut Off Level 300 ng/mL) Final   POC Cocaine UR 10/04/2021 None Detected  NONE DETECTED (Cut Off Level 300 ng/mL) Final   POC Methamphetamine UR 10/04/2021 None Detected  NONE DETECTED (Cut Off Level 1000 ng/mL) Final   POC Morphine 10/04/2021 None Detected  NONE DETECTED (Cut Off Level 300 ng/mL) Final   POC Methadone UR 10/04/2021 None Detected  NONE DETECTED (Cut Off Level 300 ng/mL) Final   POC Oxycodone UR 10/04/2021 Positive (A)  NONE DETECTED (Cut Off Level 100 ng/mL) Final   POC Marijuana UR 10/04/2021 None Detected  NONE DETECTED (Cut Off Level 50 ng/mL) Final   SARSCOV2ONAVIRUS 2 AG 10/04/2021 NEGATIVE  NEGATIVE Final   Comment: (NOTE) SARS-CoV-2 antigen NOT DETECTED.   Negative results are presumptive.  Negative results do not preclude SARS-CoV-2 infection and should not be used as the sole basis for treatment or other patient management decisions, including infection  control decisions, particularly in the presence of clinical signs and  symptoms consistent with COVID-19, or in those who have been in contact with the virus.  Negative results must be combined with clinical observations, patient history, and epidemiological information. The expected result is Negative.  Fact Sheet for Patients: HandmadeRecipes.com.cy  Fact Sheet for Healthcare Providers: FuneralLife.at  This test is not yet approved or cleared by the [place] FDA and  has been authorized for detection and/or diagnosis of SARS-CoV-2 by FDA under an Emergency Use Authorization (EUA).  This EUA will remain in effect (meaning this test can be used) for the duration of  the COV                          ID-19  declaration under Section 564(b)(1) of the Act, 21 U.S.C. section 360bbb-3(b)(1), unless the authorization is terminated or revoked sooner.     Valproic Acid Lvl 10/04/2021 51  50.0 - 100.0 ug/mL Final   Performed at [facility] [address], [place], [place] [address]   Valproic Acid Lvl 10/08/2021 57  50.0 - 100.0 ug/mL Final   Performed at [facility] [address] [place], [place] [address]   Glucose-Capillary 10/09/2021 123 (H)  70 - 99 mg/dL Final   Glucose reference range applies only to samples taken after fasting for at least 8 hours.  Admission on 09/09/2021, Discharged on 09/10/2021  Component Date Value Ref Range Status   Sodium 09/09/2021 134 (L)  135 - 145 mmol/L Final   Potassium 09/09/2021 4.3  3.5 - 5.1 mmol/L Final   Chloride 09/09/2021 99  98 - 111 mmol/L Final   CO2 09/09/2021 25  22 - 32 mmol/L Final   Glucose, Bld 09/09/2021 107 (H)  70 - 99 mg/dL Final   Glucose reference range applies only to samples taken after fasting for at least 8 hours.   BUN 09/09/2021 12  6 - 20 mg/dL Final   Creatinine, Ser 09/09/2021 0.74  0.44 -  1.00 mg/dL Final   Calcium 09/09/2021 9.0  8.9 - 10.3 mg/dL Final   Total Protein 09/09/2021 7.4  6.5 - 8.1 g/dL Final   Albumin 09/09/2021 3.1 (L)  3.5 - 5.0 g/dL Final   AST 09/09/2021 13 (L)  15 - 41 U/L Final   ALT 09/09/2021 13  0 - 44 U/L Final   Alkaline Phosphatase 09/09/2021 66  38 - 126 U/L Final   Total Bilirubin 09/09/2021 0.2 (L)  0.3 - 1.2 mg/dL Final   GFR, Estimated 09/09/2021 >60  >60 mL/min Final   Comment: (NOTE) Calculated using the CKD-EPI Creatinine Equation (2021)    Anion gap 09/09/2021 10  5 - 15 Final   Performed at [place] [address], [place], [place] [address]   WBC 09/09/2021 8.5  4.0 - 10.5 K/uL Final   RBC 09/09/2021 4.53  3.87 - 5.11 MIL/uL Final   Hemoglobin 09/09/2021 11.8 (L)  12.0 - 15.0 g/dL Final   HCT 09/09/2021 38.0  36.0 - 46.0 % Final   MCV 09/09/2021 83.9   80.0 - 100.0 fL Final   MCH 09/09/2021 26.0  26.0 - 34.0 pg Final   MCHC 09/09/2021 31.1  30.0 - 36.0 g/dL Final   RDW 09/09/2021 17.8 (H)  11.5 - 15.5 % Final   Platelets 09/09/2021 442 (H)  150 - 400 K/uL Final   nRBC 09/09/2021 0.0  0.0 - 0.2 % Final   Neutrophils Relative % 09/09/2021 70  % Final   Neutro Abs 09/09/2021 5.9  1.7 - 7.7 K/uL Final   Lymphocytes Relative 09/09/2021 20  % Final   Lymphs Abs 09/09/2021 1.7  0.7 - 4.0 K/uL Final   Monocytes Relative 09/09/2021 8  % Final   Monocytes Absolute 09/09/2021 0.7  0.1 - 1.0 K/uL Final   Eosinophils Relative 09/09/2021 1  % Final   Eosinophils Absolute 09/09/2021 0.1  0.0 - 0.5 K/uL Final   Basophils Relative 09/09/2021 1  % Final   Basophils Absolute 09/09/2021 0.1  0.0 - 0.1 K/uL Final   Immature Granulocytes 09/09/2021 0  % Final   Abs Immature Granulocytes 09/09/2021 0.03  0.00 - 0.07 K/uL Final   Performed at [facility], [place] [address], [place], [place] [address]   Ammonia 09/09/2021 37 (H)  9 - 35 umol/L Final   Comment: HEMOLYSIS AT THIS LEVEL MAY AFFECT RESULT Performed at [facility], [place] [address], [place], [place] [address]    Opiates 09/09/2021 NONE DETECTED  NONE DETECTED Final   Cocaine 09/09/2021 NONE DETECTED  NONE DETECTED Final   Benzodiazepines 09/09/2021 NONE DETECTED  NONE DETECTED Final   Amphetamines 09/09/2021 NONE DETECTED  NONE DETECTED Final   Tetrahydrocannabinol 09/09/2021 NONE DETECTED  NONE DETECTED Final   Barbiturates 09/09/2021 NONE DETECTED  NONE DETECTED Final   Comment: (NOTE) DRUG SCREEN FOR MEDICAL PURPOSES ONLY.  IF CONFIRMATION IS NEEDED FOR ANY PURPOSE, NOTIFY LAB WITHIN 5 DAYS.  LOWEST DETECTABLE LIMITS FOR URINE DRUG SCREEN Drug Class                     Cutoff (ng/mL) Amphetamine and metabolites    1000 Barbiturate and metabolites    200 Benzodiazepine                 465 Tricyclics and metabolites     300 Opiates and metabolites        300 Cocaine and  metabolites  300 THC                            50 Performed at [facility], [place] [address] [place], [place] [address]    Alcohol, Ethyl (B) 09/09/2021 <10  <10 mg/dL Final   Comment: (NOTE) Lowest detectable limit for serum alcohol is 10 mg/dL.  For medical purposes only. Performed at [facility], [place] [address], [place], [place] [address]    Lipase 09/09/2021 33  11 - 51 U/L Final   Performed at [facility] [address], [place], [place] [address]   Color, Urine 09/09/2021 COLORLESS (A)  YELLOW Final   APPearance 09/09/2021 CLEAR  CLEAR Final   Specific Gravity, Urine 09/09/2021 1.002 (L)  1.005 - 1.030 Final   pH 09/09/2021 6.0  5.0 - 8.0 Final   Glucose, UA 09/09/2021 NEGATIVE  NEGATIVE mg/dL Final   Hgb urine dipstick 09/09/2021 NEGATIVE  NEGATIVE Final   Bilirubin Urine 09/09/2021 NEGATIVE  NEGATIVE Final   Ketones, ur 09/09/2021 NEGATIVE  NEGATIVE mg/dL Final   Protein, ur 09/09/2021 NEGATIVE  NEGATIVE mg/dL Final   Nitrite 09/09/2021 NEGATIVE  NEGATIVE Final   Leukocytes,Ua 09/09/2021 NEGATIVE  NEGATIVE Final   Performed at [facility] [address], [place], [place] [address]   Specimen Description 09/09/2021 URINE, CLEAN CATCH   Final   Special Requests 09/09/2021 NONE   Final   Culture 09/09/2021  (A)   Final                   Value:<10,000 COLONIES/mL INSIGNIFICANT GROWTH Performed at [facility], [place] [address] [place], [place] [address]    Report Status 09/09/2021 09/10/2021 FINAL   Final   D-Dimer, Quant 09/09/2021 0.92 (H)  0.00 - 0.50 ug/mL-FEU Final   Comment: (NOTE) At the manufacturer cut-off value of 0.5 g/mL FEU, this assay has a negative predictive value of 95-100%.This assay is intended for use in conjunction with a clinical pretest probability (PTP) assessment model to exclude pulmonary embolism (PE) and deep venous thrombosis (DVT) in outpatients suspected of PE or DVT. Results should be correlated  with clinical presentation. Performed at [facility], [place] [address], [place], [place] [address]    Troponin I (High Sensitivity) 09/09/2021 5  <18 ng/L Final   Comment: (NOTE) Elevated high sensitivity troponin I (hsTnI) values and significant  changes across serial measurements may suggest ACS but many other  chronic and acute conditions are known to elevate hsTnI results.  Refer to the "Links" section for chest pain algorithms and additional  guidance. Performed at [facility], [place] [address], [place],  [address]    BP 09/10/2021 135/83  mmHg Final   S' Lateral 09/10/2021 2.30  cm Final   Area-P 1/2 09/10/2021 5.58  cm2 Final    Blood Alcohol level:  Lab Results  Component Value Date   ETH <10 11/23/2021   ETH <10 43/88/8757    Metabolic Disorder Labs: Lab Results  Component Value Date   HGBA1C 7.0 (H) 10/04/2021   MPG 154.2 10/04/2021   Lab Results  Component Value Date   PROLACTIN 3.8 (L) 11/23/2021   Lab Results  Component Value Date   CHOL 171 11/23/2021   TRIG 125 11/23/2021   HDL 65 11/23/2021   CHOLHDL 2.6 11/23/2021   VLDL 25 11/23/2021   LDLCALC 81 11/23/2021   LDLCALC  95 10/04/2021    Therapeutic Lab Levels: No results found for: "LITHIUM" Lab Results  Component Value Date   VALPROATE 54 12/03/2021   VALPROATE 45 (L) 11/09/2021   No results found for: "CBMZ"  Physical Findings   PHQ2-9    Flowsheet Row ED from 11/23/2021 in [facility]  PHQ-2 Total Score 2  PHQ-9 Total Score 3      Flowsheet Row ED from 11/23/2021 in [facility] Most recent reading at 11/23/2021  6:44 PM ED from 11/22/2021 in [place] DEPT Most recent reading at 11/22/2021  8:28 PM ED from 11/22/2021 in [place] DEPT Most recent reading at 11/22/2021  1:24 AM  C-SSRS RISK CATEGORY No Risk No Risk No Risk        Musculoskeletal   Strength & Muscle Tone: within normal limits Gait & Station: normal Patient leans: N/A  Psychiatric Specialty Exam  Presentation  General Appearance:  Appropriate for Environment; Casual; Disheveled  Eye Contact: Good  Speech: Clear and Coherent; Normal Rate  Speech Volume: Decreased  Handedness: Right   Mood and Affect  Mood: Euthymic  Affect: Appropriate; Congruent; Constricted   Thought Process  Thought Processes: Coherent; Goal Directed; Linear  Descriptions of Associations:Intact  Orientation:Full (Time, Place and Person)  Thought Content:WDL  Diagnosis of Schizophrenia or Schizoaffective disorder in past: Yes  Hallucinations:Hallucinations: None  Ideas of Reference:None  Suicidal Thoughts:Suicidal Thoughts: No (Contracted to safety)  Homicidal Thoughts:Homicidal Thoughts: No   Sensorium  Memory: Immediate Fair  Judgment: Fair  Insight: Shallow   Executive Functions  Concentration: Good  Attention Span: Good  Recall: Good  Fund of Knowledge: Fair  Language: Good   Psychomotor Activity  Psychomotor Activity: Psychomotor Activity: Normal   Assets  Assets: Communication Skills; Desire for Improvement   Sleep  Sleep: Sleep: Good  Physical Exam  Physical Exam Vitals and nursing note reviewed.  Constitutional:      General: She is not in acute distress.    Appearance: She is not ill-appearing or diaphoretic.  HENT:     Head: Normocephalic and atraumatic.  Pulmonary:     Effort: Pulmonary effort is normal. No respiratory distress.  Neurological:     General: No focal deficit present.     Mental Status: She is alert.    Blood pressure 117/76, pulse (!) 103, temperature (!) 97.5 F (36.4 C), temperature source Oral, resp. rate 16, SpO2 97 %. There is no height or weight on file to calculate BMI.  Treatment Plan Summary: Kately remains cleared by psychiatry.   Facility [facility] team along with [facility] health care  manager and case manager and Adult [employer] representative continue to seek placement for [place].  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 apixaban, 5 mg, BID clozapine, 50 mg, Daily cloZAPine, 75 mg, QHS diltiazem, 240 mg, Daily divalproex, 500 mg, BID fluticasone furoate-vilanterol, 1 puff, Daily insulin aspart, 0-9 Units, TID WC insulin glargine-yfgn, 12 Units, Daily latanoprost, 1 drop, QHS menthol-cetylpyridinium, 1 lozenge, Once metFORMIN, 750 mg, BID WC nicotine, 7 mg, Daily Vitamin D (Ergocalciferol), 50,000 Units, Q7 days    [name], DO 12/15/2021 8:51 AM

## 2021-12-15 NOTE — ED Notes (Signed)
Pt resting quietly.  Breathing even and unlabored.   Staff will continue to monitor for safety.  

## 2021-12-15 NOTE — ED Notes (Signed)
Pt's CBC was drawn from L arm.

## 2021-12-15 NOTE — Progress Notes (Signed)
Brief note Pharmacy: [facility]    10/17:  [place] = 3000 ANC monitoring frequency:  weekly  Next ANC reporting is due 10/24.  Thanks!  [name] 12/15/2021 11:37 AM

## 2021-12-15 NOTE — ED Notes (Signed)
Pt resting in recliner bed with eyes closed. Will continue to monitor for safety

## 2021-12-15 NOTE — ED Notes (Signed)
Pt. awake / alert / oriented. Denies SI/HI. No resp distress noted. Will continue to monitor for safety

## 2021-12-16 DIAGNOSIS — F209 Schizophrenia, unspecified: Secondary | ICD-10-CM | POA: Diagnosis not present

## 2021-12-16 LAB — GLUCOSE, CAPILLARY
Glucose-Capillary: 104 mg/dL — ABNORMAL HIGH (ref 70–99)
Glucose-Capillary: 156 mg/dL — ABNORMAL HIGH (ref 70–99)
Glucose-Capillary: 116 mg/dL — ABNORMAL HIGH (ref 70–99)
Glucose-Capillary: 159 mg/dL — ABNORMAL HIGH (ref 70–99)

## 2021-12-16 NOTE — ED Notes (Signed)
Pt voices c/o ankle pain. No swelling noted. She states she broke it recently and it is aching. Encouraged her to elevate feet. PRN pain med given. Will continue to monitor for safety

## 2021-12-16 NOTE — Progress Notes (Addendum)
LCSW Progress Note   [phone] - LCSW on the phone with medical records to inquire about ACE documentation being scanned into pt's chart for access. LCSW called [phone] and left a message requesting a call back.  [phone] - LCSW contacted Lizabeth Leyden, [place], [phone], for introductions and find out updates.  No updates at this time except Ms. Tamala Julian is waiting on the capacity documentation.  The provider note written on 11 December 2021 discussing the summary of the ACE was forwarded to Boris Lown, LCSW.  Provider note dated 11 December 2021 that summarizes the ACE and the psychological consult performed on 14 December 2021 was provided to Lizabeth Leyden, [place] worker @ [place].   Omelia Blackwater, MSW, [place] phone

## 2021-12-16 NOTE — ED Notes (Signed)
Pt sleeping@this time. Breathing even and unlabored. Will continue to monitor for safety 

## 2021-12-16 NOTE — ED Notes (Signed)
Pt awake watching TV with peer. She denies SI/HI. States she has had a good day. She agrees to seek staff if having any negative feelings. Will continue to monitor for safety.

## 2021-12-16 NOTE — ED Notes (Signed)
Patient A&Ox4. Patient denies SI/HI and AVH. Patient denies any physical complaints when asked. No acute distress noted. Support and encouragement provided. Routine safety checks conducted according to facility protocol. Encouraged patient to notify staff if thoughts of harm toward self or others arise. Patient verbalize understanding and agreement. Will continue to monitor for safety.    

## 2021-12-16 NOTE — ED Notes (Signed)
Patient resting quietly in bed with eyes closed. Respirations equal and unlabored, skin warm and dry, NAD. Routine safety checks conducted according to facility protocol. Will continue to monitor for safety.  

## 2021-12-16 NOTE — ED Notes (Signed)
Patient sitting on bed quietly. Respirations equal and unlabored, skin warm and dry, NAD. Routine safety checks conducted according to facility protocol. Will continue to monitor for safety.

## 2021-12-16 NOTE — ED Notes (Signed)
Patient resting quietly  on bed. Respirations equal and unlabored, skin warm and dry, NAD. Routine safety checks conducted according to facility protocol. Will continue to monitor for safety.

## 2021-12-16 NOTE — ED Notes (Signed)
Pt sleeping. No c/o pain. Will continue to monitor for safety

## 2021-12-16 NOTE — ED Provider Notes (Signed)
Behavioral Health Progress Note  Date and Time: 12/16/2021 10:53 AM Name: Teresa Murillo MRN:  [id number]  HPI: Teresa Murillo is a 61 y.o. female with a past psychiatric history of schizophrenia, aggressive behavior, and possible intellectual disability presenting to [facility] on 11/23/21 voluntarily as a walk in via [facility] with complaints that she was locked out of the group home. Patient has been dismissed from group home due to multiple elopements. Pt had already been discharged from this facility but had been living there temporarily while DSS was looking for new placement. Pt is boarding at [facility].  Completed an AID to Capacity Evaluation (ACE) with attending Dr. Dwyane Dee on 12/11/2021.  Please see media tab for full details.   Completed test with Teresa Murillo: Wechsler Adult Intelligence Scale-4, Teresa Murillo achieved a full-scale IQ score of 73 and a percentile rank of 4 placing her in the borderline range of intellectual functioning (12/14/2021) Please see consult note from Teresa Murillo on 12/14/2021  Subjective:  Patient was initially seen asleep in bed, comfortably, awoken easily.  She denied SI/HI/AVH. Patient stated that she is sleepy this AM but otherwise feeling well. Endorsed transient sore throat which she attributed to dry air. Stated she also experienced transient and mild dizziness, about 2x yesterday for about 2 mins that self resolved. Patient was unable to identify cause or triggers. Discussed with patient to drink plenty of fluids.  Patient that she was unconcerned about the sore throat and mild dizziness yesterday.   FS:[id number] Thoughts: No (Contracted to safety) YE:[id number] Thoughts: No UOH:[id number]: None Ideas of EMV:VKPQ   Mood: Euthymic Sleep:Good Appetite: Good   Review of Systems  Constitutional:        Sleepy. Denied diaphoresis  HENT:  Positive for sore throat (mild, transient).   Respiratory:  Negative for cough, sputum production  and shortness of breath.   Cardiovascular:  Negative for chest pain.  Gastrointestinal:  Negative for abdominal pain, constipation, diarrhea, nausea and vomiting.  Musculoskeletal:  Negative for falls.  Neurological:  Positive for dizziness (intermittent, self resolving after 2 mins). Negative for tremors, weakness and headaches.    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 On the Wechsler Adult Intelligence Scale-4, Teresa Murillo achieved a full-scale IQ score of 73 and a percentile rank of 4 placing her in the borderline range of intellectual functioning.    Chronic obstructive pulmonary disease (COPD) (HCC)    Glaucoma    Hyperlipidemia    Hypertension    Iron deficiency    Schizoaffective disorder (HCC)    Type 2 diabetes mellitus (HCC)     Past Surgical History:  Procedure Laterality Date   TUBAL LIGATION     Family History:  Family History  Problem Relation Age of Onset   Breast cancer Maternal Grandmother    Family Psychiatric  History: none reported Social History:  Social History   Substance and Sexual Activity  Alcohol Use Yes     Social History   Substance and Sexual Activity  Drug Use Not Currently    Social History   Socioeconomic History   Marital status: Divorced    Spouse name: Not on file  Number of children: Not on file   Years of education: Not on file   Highest education level: Not on file  Occupational History   Not on file  Tobacco Use   Smoking status: Every Day    Packs/day: 1.00    Types: Cigars, Cigarettes   Smokeless tobacco: Current   Vaping Use   Vaping Use: Never used  Substance and Sexual Activity   Alcohol use: Yes   Drug use: Not Currently   Sexual activity: Not Currently    Birth control/protection: Surgical  Other Topics Concern   Not on file  Social History Narrative   Not on file   Social Determinants of Health   Financial Resource Strain: Not on file  Food Insecurity: Not on file  Transportation Needs: Not on file  Physical Activity: Not on file  Stress: Not on file  Social Connections: Not on file   SDOH:  SDOH Screenings   Depression (PHQ2-9): Low Risk  (11/23/2021)  Tobacco Use: High Risk (12/14/2021)   Additional Social History:    Pain Medications: See MAR Prescriptions: See MAR Over the Counter: See MAR History of alcohol / drug use?: No history of alcohol / drug abuse Longest period of sobriety (when/how long): N/A                    Current Medications:  Current Facility-Administered Medications  Medication Dose Route Frequency Provider Last Rate Last Admin   acetaminophen (TYLENOL) tablet 650 mg  650 mg Oral Q6H PRN Rankin, Shuvon B, NP   650 mg at 12/13/21 2354   albuterol (VENTOLIN HFA) 108 (90 Base) MCG/ACT inhaler 2 puff  2 puff Inhalation Q6H PRN Rankin, Shuvon B, NP   2 puff at 12/09/21 2102   alum & mag hydroxide-simeth (MAALOX/MYLANTA) 200-200-20 MG/5ML suspension 30 mL  30 mL Oral Q4H PRN Rankin, Shuvon B, NP       apixaban (ELIQUIS) tablet 5 mg  5 mg Oral BID Rankin, Shuvon B, NP   5 mg at 12/16/21 0926   cloZAPine (CLOZARIL) tablet 50 mg  50 mg Oral Daily [name], NP   50 mg at 12/16/21 0926   cloZAPine (CLOZARIL) tablet 75 mg  75 mg Oral Dorie Rank, NP   75 mg at 12/15/21 2125   diltiazem (CARDIZEM CD) 24 hr capsule 240 mg  240 mg Oral Daily Rankin, Shuvon B, NP   240 mg at 12/16/21 0925   divalproex (DEPAKOTE ER) 24 hr tablet 500 mg  500 mg Oral BID Rankin, Shuvon B, NP   500 mg at 12/16/21 0925   fluticasone furoate-vilanterol (BREO ELLIPTA)  200-25 MCG/ACT 1 puff  1 puff Inhalation Daily Rankin, Shuvon B, NP   1 puff at 12/16/21 0809   haloperidol (HALDOL) tablet 10 mg  10 mg Oral Q8H PRN Rankin, Shuvon B, NP       insulin aspart (novoLOG) injection 0-9 Units  0-9 Units Subcutaneous TID WC Tharon Aquas, NP   1 Units at 12/15/21 1634   insulin glargine-yfgn (SEMGLEE) injection 10 Units  10 Units Subcutaneous Daily [name], DO   10 Units at 12/16/21 0927   latanoprost (XALATAN) 0.005 % ophthalmic solution 1 drop  1 drop Both Eyes QHS Rankin, Shuvon B, NP   1 drop at 12/15/21 2128   magnesium hydroxide (MILK OF MAGNESIA) suspension 30 mL  30 mL Oral Daily PRN Rankin, Shuvon B, NP       menthol-cetylpyridinium (CEPACOL) lozenge 3  mg  1 lozenge Oral Once Onuoha, Chinwendu V, NP       metFORMIN (GLUCOPHAGE-XR) 24 hr tablet 1,000 mg  1,000 mg Oral BID WC [name], DO   1,000 mg at 12/16/21 0034   nicotine (NICODERM CQ - dosed in mg/24 hr) patch 7 mg  7 mg Transdermal Daily Rankin, Shuvon B, NP   7 mg at 12/16/21 0931   Vitamin D (Ergocalciferol) (DRISDOL) 1.25 MG (50000 UNIT) capsule 50,000 Units  50,000 Units Oral Q7 days Rankin, Shuvon B, NP   50,000 Units at 12/10/21 9179   Current Outpatient Medications  Medication Sig Dispense Refill   Accu-Chek Softclix Lancets lancets Use as directed up to four times daily 100 each 0   apixaban (ELIQUIS) 5 MG TABS tablet Take 1 tablet (5 mg total) by mouth 2 (two) times daily. 60 tablet 0   ARIPiprazole (ABILIFY) 10 MG tablet Take 1 tablet (10 mg total) by mouth daily. 30 tablet 0   Blood Glucose Monitoring Suppl (ACCU-CHEK GUIDE) w/Device KIT Use as directed up to four times daily 1 kit 0   budesonide-formoterol (SYMBICORT) 160-4.5 MCG/ACT inhaler Inhale 2 puffs into the lungs in the morning and at bedtime.     Cholecalciferol (VITAMIN D3) 1.25 MG (50000 UT) CAPS Take 50,000 Units by mouth every Thursday.     cloZAPine (CLOZARIL) 25 MG tablet Take 3 tablets (75 mg total) by mouth at  bedtime. 90 tablet 0   clozapine (CLOZARIL) 50 MG tablet Take 1 tablet (50 mg total) by mouth daily. 30 tablet 0   diltiazem (CARDIZEM CD) 240 MG 24 hr capsule Take 1 capsule (240 mg total) by mouth daily. (Patient not taking: Reported on 11/24/2021) 30 capsule 0   divalproex (DEPAKOTE ER) 500 MG 24 hr tablet Take 1 tablet (500 mg total) by mouth 2 (two) times daily. 60 tablet 0   glucose blood test strip Use as directed up to four times daily 50 each 0   haloperidol (HALDOL) 10 MG tablet Take 1 tablet (10 mg total) by mouth 3 (three) times daily as needed for agitation (and psychotic symptoms).     INGREZZA 40 MG capsule Take 1 capsule (40 mg total) by mouth in the morning. 30 capsule 0   insulin aspart (NOVOLOG) 100 UNIT/ML FlexPen Before each meal 3 times a day, 140-199 - 2 units, 200-250 - 4 units, 251-299 - 6 units,  300-349 - 8 units,  350 or above 10 units. Insulin PEN if approved, provide syringes and needles if needed.Please switch to any approved short acting Insulin if needed. 15 mL 0   insulin glargine (LANTUS) 100 UNIT/ML Solostar Pen Inject 12 Units into the skin daily. 15 mL 0   Insulin Pen Needle 32G X 4 MM MISC Use 4 times a day with insulin, 1 month supply. 100 each 0   latanoprost (XALATAN) 0.005 % ophthalmic solution Place 1 drop into both eyes at bedtime.     metFORMIN (GLUCOPHAGE) 500 MG tablet Take 500 mg by mouth 2 (two) times daily with a meal.     nicotine (NICODERM CQ - DOSED IN MG/24 HR) 7 mg/24hr patch Place 1 patch (7 mg total) onto the skin daily. 28 patch 0   PROAIR HFA 108 (90 Base) MCG/ACT inhaler Inhale 2 puffs into the lungs every 6 (six) hours as needed for wheezing or shortness of breath.      Labs  Lab Results:  No results displayed because visit has over 200 results.  Admission on 11/22/2021, Discharged on 11/22/2021  Component Date Value Ref Range Status   Glucose-Capillary 11/22/2021 169 (H)  70 - 99 mg/dL Final   Glucose reference range applies only  to samples taken after fasting for at least 8 hours.  No results displayed because visit has over 200 results.    Admission on 10/04/2021, Discharged on 10/22/2021  Component Date Value Ref Range Status   SARS Coronavirus 2 by RT PCR 10/04/2021 NEGATIVE  NEGATIVE Final   Comment: (NOTE) SARS-CoV-2 target nucleic acids are NOT DETECTED.  The SARS-CoV-2 RNA is generally detectable in upper respiratory specimens during the acute phase of infection. The lowest concentration of SARS-CoV-2 viral copies this assay can detect is 138 copies/mL. A negative result does not preclude SARS-Cov-2 infection and should not be used as the sole basis for treatment or other patient management decisions. A negative result may occur with  improper specimen collection/handling, submission of specimen other than nasopharyngeal swab, presence of viral mutation(s) within the areas targeted by this assay, and inadequate number of viral copies(<138 copies/mL). A negative result must be combined with clinical observations, patient history, and epidemiological information. The expected result is Negative.  Fact Sheet for Patients:  EntrepreneurPulse.com.au  Fact Sheet for Healthcare Providers:  IncredibleEmployment.be  This test is no                          t yet approved or cleared by the [place] FDA and  has been authorized for detection and/or diagnosis of SARS-CoV-2 by FDA under an Emergency Use Authorization (EUA). This EUA will remain  in effect (meaning this test can be used) for the duration of the COVID-19 declaration under Section 564(b)(1) of the Act, 21 U.S.C.section 360bbb-3(b)(1), unless the authorization is terminated  or revoked sooner.       Influenza A by PCR 10/04/2021 NEGATIVE  NEGATIVE Final   Influenza B by PCR 10/04/2021 NEGATIVE  NEGATIVE Final   Comment: (NOTE) The Xpert Xpress SARS-CoV-2/FLU/RSV plus assay is intended as an aid in the  diagnosis of influenza from Nasopharyngeal swab specimens and should not be used as a sole basis for treatment. Nasal washings and aspirates are unacceptable for Xpert Xpress SARS-CoV-2/FLU/RSV testing.  Fact Sheet for Patients: EntrepreneurPulse.com.au  Fact Sheet for Healthcare Providers: IncredibleEmployment.be  This test is not yet approved or cleared by the [place] FDA and has been authorized for detection and/or diagnosis of SARS-CoV-2 by FDA under an Emergency Use Authorization (EUA). This EUA will remain in effect (meaning this test can be used) for the duration of the COVID-19 declaration under Section 564(b)(1) of the Act, 21 U.S.C. section 360bbb-3(b)(1), unless the authorization is terminated or revoked.  Performed at [facility], [place] [address] [place], [place] [address]    WBC 10/04/2021 8.4  4.0 - 10.5 K/uL Final   RBC 10/04/2021 4.42  3.87 - 5.11 MIL/uL Final   Hemoglobin 10/04/2021 11.6 (L)  12.0 - 15.0 g/dL Final   HCT 10/04/2021 35.7 (L)  36.0 - 46.0 % Final   MCV 10/04/2021 80.8  80.0 - 100.0 fL Final   MCH 10/04/2021 26.2  26.0 - 34.0 pg Final   MCHC 10/04/2021 32.5  30.0 - 36.0 g/dL Final   RDW 10/04/2021 16.0 (H)  11.5 - 15.5 % Final   Platelets 10/04/2021 372  150 - 400 K/uL Final   nRBC 10/04/2021 0.0  0.0 - 0.2 % Final   Neutrophils Relative %  10/04/2021 68  % Final   Neutro Abs 10/04/2021 5.7  1.7 - 7.7 K/uL Final   Lymphocytes Relative 10/04/2021 22  % Final   Lymphs Abs 10/04/2021 1.8  0.7 - 4.0 K/uL Final   Monocytes Relative 10/04/2021 8  % Final   Monocytes Absolute 10/04/2021 0.7  0.1 - 1.0 K/uL Final   Eosinophils Relative 10/04/2021 1  % Final   Eosinophils Absolute 10/04/2021 0.1  0.0 - 0.5 K/uL Final   Basophils Relative 10/04/2021 1  % Final   Basophils Absolute 10/04/2021 0.1  0.0 - 0.1 K/uL Final   Immature Granulocytes 10/04/2021 0  % Final   Abs Immature Granulocytes 10/04/2021 0.03   0.00 - 0.07 K/uL Final   Performed at [facility], [place] [address], [place], [place] [address]   Sodium 10/04/2021 136  135 - 145 mmol/L Final   Potassium 10/04/2021 4.2  3.5 - 5.1 mmol/L Final   Chloride 10/04/2021 104  98 - 111 mmol/L Final   CO2 10/04/2021 25  22 - 32 mmol/L Final   Glucose, Bld 10/04/2021 100 (H)  70 - 99 mg/dL Final   Glucose reference range applies only to samples taken after fasting for at least 8 hours.   BUN 10/04/2021 9  6 - 20 mg/dL Final   Creatinine, Ser 10/04/2021 0.52  0.44 - 1.00 mg/dL Final   Calcium 10/04/2021 9.0  8.9 - 10.3 mg/dL Final   Total Protein 10/04/2021 7.0  6.5 - 8.1 g/dL Final   Albumin 10/04/2021 3.2 (L)  3.5 - 5.0 g/dL Final   AST 10/04/2021 13 (L)  15 - 41 U/L Final   ALT 10/04/2021 10  0 - 44 U/L Final   Alkaline Phosphatase 10/04/2021 61  38 - 126 U/L Final   Total Bilirubin 10/04/2021 0.3  0.3 - 1.2 mg/dL Final   GFR, Estimated 10/04/2021 >60  >60 mL/min Final   Comment: (NOTE) Calculated using the CKD-EPI Creatinine Equation (2021)    Anion gap 10/04/2021 7  5 - 15 Final   Performed at [facility] [address] [place], [place] [address]   Hgb A1c MFr Bld 10/04/2021 7.0 (H)  4.8 - 5.6 % Final   Comment: (NOTE) Pre diabetes:          5.7%-6.4%  Diabetes:              >6.4%  Glycemic control for   <7.0% adults with diabetes    Mean Plasma Glucose 10/04/2021 154.2  mg/dL Final   Performed at [facility], [place] [address], [place], [place] [address]   Cholesterol 10/04/2021 178  0 - 200 mg/dL Final   Triglycerides 10/04/2021 155 (H)  <150 mg/dL Final   HDL 10/04/2021 52  >40 mg/dL Final   Total CHOL/HDL Ratio 10/04/2021 3.4  RATIO Final   VLDL 10/04/2021 31  0 - 40 mg/dL Final   LDL Cholesterol 10/04/2021 95  0 - 99 mg/dL Final   Comment:        Total Cholesterol/HDL:CHD Risk Coronary Heart Disease Risk Table                     Men   Women  1/2 Average Risk   3.4   3.3  Average Risk       5.0   4.4   2 X Average Risk   9.6   7.1  3 X Average Risk  23.4   11.0  Use the calculated Patient Ratio above and the CHD Risk Table to determine the patient's CHD Risk.        ATP III CLASSIFICATION (LDL):  <100     mg/dL   Optimal  100-129  mg/dL   Near or Above                    Optimal  130-159  mg/dL   Borderline  160-189  mg/dL   High  >190     mg/dL   Very High Performed at [facility] [address], [place], [place] [address]    POC Amphetamine UR 10/04/2021 None Detected  NONE DETECTED (Cut Off Level 1000 ng/mL) Final   POC Secobarbital (BAR) 10/04/2021 None Detected  NONE DETECTED (Cut Off Level 300 ng/mL) Final   POC Buprenorphine (BUP) 10/04/2021 None Detected  NONE DETECTED (Cut Off Level 10 ng/mL) Final   POC Oxazepam (BZO) 10/04/2021 None Detected  NONE DETECTED (Cut Off Level 300 ng/mL) Final   POC Cocaine UR 10/04/2021 None Detected  NONE DETECTED (Cut Off Level 300 ng/mL) Final   POC Methamphetamine UR 10/04/2021 None Detected  NONE DETECTED (Cut Off Level 1000 ng/mL) Final   POC Morphine 10/04/2021 None Detected  NONE DETECTED (Cut Off Level 300 ng/mL) Final   POC Methadone UR 10/04/2021 None Detected  NONE DETECTED (Cut Off Level 300 ng/mL) Final   POC Oxycodone UR 10/04/2021 Positive (A)  NONE DETECTED (Cut Off Level 100 ng/mL) Final   POC Marijuana UR 10/04/2021 None Detected  NONE DETECTED (Cut Off Level 50 ng/mL) Final   SARSCOV2ONAVIRUS 2 AG 10/04/2021 NEGATIVE  NEGATIVE Final   Comment: (NOTE) SARS-CoV-2 antigen NOT DETECTED.   Negative results are presumptive.  Negative results do not preclude SARS-CoV-2 infection and should not be used as the sole basis for treatment or other patient management decisions, including infection  control decisions, particularly in the presence of clinical signs and  symptoms consistent with COVID-19, or in those who have been in contact with the virus.  Negative results must be combined with clinical observations,  patient history, and epidemiological information. The expected result is Negative.  Fact Sheet for Patients: HandmadeRecipes.com.cy  Fact Sheet for Healthcare Providers: FuneralLife.at  This test is not yet approved or cleared by the [place] FDA and  has been authorized for detection and/or diagnosis of SARS-CoV-2 by FDA under an Emergency Use Authorization (EUA).  This EUA will remain in effect (meaning this test can be used) for the duration of  the COV                          ID-19 declaration under Section 564(b)(1) of the Act, 21 U.S.C. section 360bbb-3(b)(1), unless the authorization is terminated or revoked sooner.     Valproic Acid Lvl 10/04/2021 51  50.0 - 100.0 ug/mL Final   Performed at [facility] [address] [place], [place] [address]   Valproic Acid Lvl 10/08/2021 57  50.0 - 100.0 ug/mL Final   Performed at [facility] [address] [place], [place] [address]   Glucose-Capillary 10/09/2021 123 (H)  70 - 99 mg/dL Final   Glucose reference range applies only to samples taken after fasting for at least 8 hours.  Admission on 09/09/2021, Discharged on 09/10/2021  Component Date Value Ref Range Status   Sodium 09/09/2021 134 (L)  135 - 145 mmol/L Final   Potassium 09/09/2021  4.3  3.5 - 5.1 mmol/L Final   Chloride 09/09/2021 99  98 - 111 mmol/L Final   CO2 09/09/2021 25  22 - 32 mmol/L Final   Glucose, Bld 09/09/2021 107 (H)  70 - 99 mg/dL Final   Glucose reference range applies only to samples taken after fasting for at least 8 hours.   BUN 09/09/2021 12  6 - 20 mg/dL Final   Creatinine, Ser 09/09/2021 0.74  0.44 - 1.00 mg/dL Final   Calcium 09/09/2021 9.0  8.9 - 10.3 mg/dL Final   Total Protein 09/09/2021 7.4  6.5 - 8.1 g/dL Final   Albumin 09/09/2021 3.1 (L)  3.5 - 5.0 g/dL Final   AST 09/09/2021 13 (L)  15 - 41 U/L Final   ALT 09/09/2021 13  0 - 44 U/L Final   Alkaline Phosphatase 09/09/2021  66  38 - 126 U/L Final   Total Bilirubin 09/09/2021 0.2 (L)  0.3 - 1.2 mg/dL Final   GFR, Estimated 09/09/2021 >60  >60 mL/min Final   Comment: (NOTE) Calculated using the CKD-EPI Creatinine Equation (2021)    Anion gap 09/09/2021 10  5 - 15 Final   Performed at [facility], [place] [address] [place], [place] [address]   WBC 09/09/2021 8.5  4.0 - 10.5 K/uL Final   RBC 09/09/2021 4.53  3.87 - 5.11 MIL/uL Final   Hemoglobin 09/09/2021 11.8 (L)  12.0 - 15.0 g/dL Final   HCT 09/09/2021 38.0  36.0 - 46.0 % Final   MCV 09/09/2021 83.9  80.0 - 100.0 fL Final   MCH 09/09/2021 26.0  26.0 - 34.0 pg Final   MCHC 09/09/2021 31.1  30.0 - 36.0 g/dL Final   RDW 09/09/2021 17.8 (H)  11.5 - 15.5 % Final   Platelets 09/09/2021 442 (H)  150 - 400 K/uL Final   nRBC 09/09/2021 0.0  0.0 - 0.2 % Final   Neutrophils Relative % 09/09/2021 70  % Final   Neutro Abs 09/09/2021 5.9  1.7 - 7.7 K/uL Final   Lymphocytes Relative 09/09/2021 20  % Final   Lymphs Abs 09/09/2021 1.7  0.7 - 4.0 K/uL Final   Monocytes Relative 09/09/2021 8  % Final   Monocytes Absolute 09/09/2021 0.7  0.1 - 1.0 K/uL Final   Eosinophils Relative 09/09/2021 1  % Final   Eosinophils Absolute 09/09/2021 0.1  0.0 - 0.5 K/uL Final   Basophils Relative 09/09/2021 1  % Final   Basophils Absolute 09/09/2021 0.1  0.0 - 0.1 K/uL Final   Immature Granulocytes 09/09/2021 0  % Final   Abs Immature Granulocytes 09/09/2021 0.03  0.00 - 0.07 K/uL Final   Performed at [facility], [place] [address] [place], [place] [address]   Ammonia 09/09/2021 37 (H)  9 - 35 umol/L Final   Comment: HEMOLYSIS AT THIS LEVEL MAY AFFECT RESULT Performed at [facility], [place] [address] [place], [place] [address]    Opiates 09/09/2021 NONE DETECTED  NONE DETECTED Final   Cocaine 09/09/2021 NONE DETECTED  NONE DETECTED Final   Benzodiazepines 09/09/2021 NONE DETECTED  NONE DETECTED Final   Amphetamines 09/09/2021 NONE DETECTED  NONE DETECTED Final    Tetrahydrocannabinol 09/09/2021 NONE DETECTED  NONE DETECTED Final   Barbiturates 09/09/2021 NONE DETECTED  NONE DETECTED Final   Comment: (NOTE) DRUG SCREEN FOR MEDICAL PURPOSES ONLY.  IF CONFIRMATION IS NEEDED FOR ANY PURPOSE, NOTIFY LAB WITHIN 5 DAYS.  LOWEST DETECTABLE LIMITS FOR URINE DRUG SCREEN Drug Class  Cutoff (ng/mL) Amphetamine and metabolites    1000 Barbiturate and metabolites    200 Benzodiazepine                 993 Tricyclics and metabolites     300 Opiates and metabolites        300 Cocaine and metabolites        300 THC                            50 Performed at [facility], [place] [address] [place], [place] [address]    Alcohol, Ethyl (B) 09/09/2021 <10  <10 mg/dL Final   Comment: (NOTE) Lowest detectable limit for serum alcohol is 10 mg/dL.  For medical purposes only. Performed at [facility], [place] [address] [place], [place] [address]    Lipase 09/09/2021 33  11 - 51 U/L Final   Performed at [facility] [address], [place], [place] [address]   Color, Urine 09/09/2021 COLORLESS (A)  YELLOW Final   APPearance 09/09/2021 CLEAR  CLEAR Final   Specific Gravity, Urine 09/09/2021 1.002 (L)  1.005 - 1.030 Final   pH 09/09/2021 6.0  5.0 - 8.0 Final   Glucose, UA 09/09/2021 NEGATIVE  NEGATIVE mg/dL Final   Hgb urine dipstick 09/09/2021 NEGATIVE  NEGATIVE Final   Bilirubin Urine 09/09/2021 NEGATIVE  NEGATIVE Final   Ketones, ur 09/09/2021 NEGATIVE  NEGATIVE mg/dL Final   Protein, ur 09/09/2021 NEGATIVE  NEGATIVE mg/dL Final   Nitrite 09/09/2021 NEGATIVE  NEGATIVE Final   Leukocytes,Ua 09/09/2021 NEGATIVE  NEGATIVE Final   Performed at [facility] [address], [place], [place] [address]   Specimen Description 09/09/2021 URINE, CLEAN CATCH   Final   Special Requests 09/09/2021 NONE   Final   Culture 09/09/2021  (A)   Final                   Value:<10,000 COLONIES/mL INSIGNIFICANT GROWTH Performed at [facility], [place] [address] [place], [place] [address]    Report Status 09/09/2021 09/10/2021 FINAL   Final   D-Dimer, Quant 09/09/2021 0.92 (H)  0.00 - 0.50 ug/mL-FEU Final   Comment: (NOTE) At the manufacturer cut-off value of 0.5 g/mL FEU, this assay has a negative predictive value of 95-100%.This assay is intended for use in conjunction with a clinical pretest probability (PTP) assessment model to exclude pulmonary embolism (PE) and deep venous thrombosis (DVT) in outpatients suspected of PE or DVT. Results should be correlated with clinical presentation. Performed at [facility], [place] [address], [place],  [address]    Troponin I (High Sensitivity) 09/09/2021 5  <18 ng/L Final   Comment: (NOTE) Elevated high sensitivity troponin I (hsTnI) values and significant  changes across serial measurements may suggest ACS but many other  chronic and acute conditions are known to elevate hsTnI results.  Refer to the "Links" section for chest pain algorithms and additional  guidance. Performed at [facility], [place] [address] [place],  [address]    BP 09/10/2021 135/83  mmHg Final   S' Lateral 09/10/2021 2.30  cm Final   Area-P 1/2 09/10/2021 5.58  cm2 Final    Blood Alcohol level:  Lab Results  Component Value Date   ETH <10 11/23/2021   ETH <10 89/37/3428    Metabolic Disorder Labs: Lab Results  Component Value Date   HGBA1C  7.0 (H) 10/04/2021   MPG 154.2 10/04/2021   Lab Results  Component Value Date   PROLACTIN 3.8 (L) 11/23/2021   Lab Results  Component Value Date   CHOL 171 11/23/2021   TRIG 125 11/23/2021   HDL 65 11/23/2021   CHOLHDL 2.6 11/23/2021   VLDL 25 11/23/2021   LDLCALC 81 11/23/2021   LDLCALC 95 10/04/2021    Therapeutic Lab Levels: No results found for: "LITHIUM" Lab Results  Component Value Date   VALPROATE 54 12/03/2021   VALPROATE 45 (L) 11/09/2021   No results found for: "CBMZ"  Physical Findings   PHQ2-9     [place] ED from 11/23/2021 in [facility]  PHQ-2 Total Score 2  PHQ-9 Total Score 3      Flowsheet Row ED from 11/23/2021 in [facility] Most recent reading at 11/23/2021  6:44 PM ED from 11/22/2021 in [place] [facility] Most recent reading at 11/22/2021  8:28 PM ED from 11/22/2021 in [place] [facility] Most recent reading at 11/22/2021  1:24 AM  C-SSRS RISK CATEGORY No Risk No Risk No Risk        Musculoskeletal  Strength & Muscle Tone: within normal limits Gait & Station: normal Patient leans: N/A  Psychiatric Specialty Exam  Presentation  General Appearance:  Appropriate for Environment; Casual  Eye Contact: Fair  Speech: Clear and Coherent; Normal Rate  Speech Volume: Decreased  Handedness: Left  Mood and Affect  Mood: Euthymic  Affect: Appropriate; Congruent; Constricted   Thought Process  Thought Processes: Coherent; Goal Directed; Linear  Descriptions of Associations:Intact  Orientation:Full (Time, Place and Person)  Thought Content:Logical; WDL  Diagnosis of Schizophrenia or Schizoaffective disorder in past: Yes  Hallucinations:Hallucinations: None   Ideas of Reference:None  Suicidal Thoughts:Suicidal Thoughts: No (Contracted to safety)   Homicidal Thoughts:Homicidal Thoughts: No   Sensorium  Memory: Immediate Good  Judgment: Fair  Insight: Fair   Executive Functions  Concentration: Good  Attention Span: Good  Recall: Good  Fund of Knowledge: Fair  Language: Good   Psychomotor Activity  Psychomotor Activity: Psychomotor Activity: Normal    Assets  Assets: Communication Skills; Desire for Improvement; Social Support   Sleep  Sleep: Sleep: Good   Physical Exam  Physical Exam Vitals and nursing note reviewed.  Constitutional:      General: She is not in acute distress.    Appearance:  She is not ill-appearing or diaphoretic.  HENT:     Head: Normocephalic and atraumatic.  Pulmonary:     Effort: Pulmonary effort is normal. No respiratory distress.  Neurological:     General: No focal deficit present.     Mental Status: She is alert.    Blood pressure (!) 151/88, pulse (!) 108, temperature 98.4 F (36.9 C), temperature source Oral, resp. rate 16, SpO2 97 %. There is no height or weight on file to calculate BMI.  Treatment Plan Summary: Janiya remains cleared by psychiatry.   Facility [facility] team along with [facility] health care manager and case manager and Adult [employer] representative continue to seek placement for [place].  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 apixaban, 5 mg, BID clozapine, 50 mg, Daily cloZAPine, 75 mg, QHS diltiazem, 240 mg, Daily divalproex, 500 mg, BID fluticasone furoate-vilanterol, 1 puff, Daily insulin aspart, 0-9 Units, TID WC insulin glargine-yfgn, 10 Units, Daily latanoprost, 1 drop, QHS menthol-cetylpyridinium, 1 lozenge, Once metFORMIN, 1,000  mg, BID WC nicotine, 7 mg, Daily Vitamin D (Ergocalciferol), 50,000 Units, Q7 days    [name], DO 12/16/2021 10:53 AM

## 2021-12-17 DIAGNOSIS — F209 Schizophrenia, unspecified: Secondary | ICD-10-CM | POA: Diagnosis not present

## 2021-12-17 LAB — GLUCOSE, CAPILLARY
Glucose-Capillary: 107 mg/dL — ABNORMAL HIGH (ref 70–99)
Glucose-Capillary: 197 mg/dL — ABNORMAL HIGH (ref 70–99)
Glucose-Capillary: 95 mg/dL (ref 70–99)
Glucose-Capillary: 154 mg/dL — ABNORMAL HIGH (ref 70–99)

## 2021-12-17 NOTE — ED Notes (Signed)
Pt sleeping at present, no distress noted.  Monitoring for safety. 

## 2021-12-17 NOTE — ED Notes (Signed)
Pt has gotten up to use restroom. Pt calm and cooperative. No c/o pain or distress. Will continue to monitor for safety

## 2021-12-17 NOTE — ED Notes (Signed)
Pt A&O x 4, no distress noted. Calm & cooperative. Monitoring for safety. 

## 2021-12-17 NOTE — ED Notes (Signed)
Patient med compliant. Ate breakfast and denies any pain or discomfort at this time. Patient denies SI,HI, and A/V/H with no plan/intent. Diabetic management reinforced. Patient verbalized understanding. Patient laying back down calmly resting.

## 2021-12-17 NOTE — Progress Notes (Signed)
LCSW Progress Note   1133 - LCSW received notification that Lizabeth Leyden, APS social worker from [place] was present with a nurse to complete an assessment on pt.  Ms. Tamala Julian informed LCSW that she would send an email what the next step would be once the assessment has been reviewed.   [place] contacted Beckie Salts from [facility], [phone].  LCSW was informed by Mr. Littie Deeds that he would like to meet with the pt at 1430 on 18 December 2021 to begin helping her with finding housing.  LCSW informed the front desk and will remind them again tomorrow of the visit.   Omelia Blackwater, MSW, [place] [facility] phone

## 2021-12-17 NOTE — ED Provider Notes (Signed)
Behavioral Health Progress Note  Date and Time: 12/17/2021 8:55 AM Name: Teresa Murillo MRN:  [id number]  HPI: Teresa Murillo is a 61 y.o. female with a past psychiatric history of schizophrenia, aggressive behavior, and possible intellectual disability presenting to [facility] on 11/23/21 voluntarily as a walk in via [facility] with complaints that she was locked out of the group home. Patient has been dismissed from group home due to multiple elopements. Pt had already been discharged from this facility but had been living there temporarily while DSS was looking for new placement. Pt is boarding at [facility].  Completed an AID to Capacity Evaluation (ACE) with attending Dr. Dwyane Dee on 12/11/2021.  Please see media tab for full details.   Completed test with [name], PhD: Wechsler Adult Intelligence Scale-4, Teresa Murillo achieved a full-scale IQ score of 73 and a percentile rank of 4 placing her in the borderline range of intellectual functioning (12/14/2021) Please see consult note from [name], PhD on 12/14/2021  Subjective:  Patient was seen initially awake sitting up in bed, comfortable. Stated that she is ready to go, discussed with patient that we are still working on finding placement. Patient stated that she understood. She had no other questions or concerns.   XF:GHWEXHBZ Thoughts: No (Contracted to safety) JI:RCVELFYBO Thoughts: No FBP:ZWCHENIDPOEUMP: None Ideas of NTI:RWER   Mood: Euthymic Sleep:Good Appetite: Good   Review of Systems  Constitutional:        Denied pain  HENT:  Negative for sore throat.   Respiratory:  Negative for shortness of breath.   Cardiovascular:  Negative for chest pain.  Gastrointestinal:  Negative for abdominal pain, constipation and diarrhea.  Neurological:  Negative for dizziness and headaches.    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 On the Wechsler Adult Intelligence Scale-4, Teresa Murillo achieved a full-scale IQ score of 73 and a percentile rank of 4 placing her in the borderline range of intellectual functioning.    Chronic obstructive pulmonary disease (COPD) (HCC)    Glaucoma    Hyperlipidemia    Hypertension    Iron deficiency    Schizoaffective disorder (HCC)    Type 2 diabetes mellitus (HCC)     Past Surgical History:  Procedure Laterality Date   TUBAL LIGATION     Family History:  Family History  Problem Relation Age of Onset   Breast cancer Maternal Grandmother    Family Psychiatric  History: none reported Social History:  Social History   Substance and Sexual Activity  Alcohol Use Yes     Social History   Substance and Sexual Activity  Drug Use Not Currently    Social History   Socioeconomic History   Marital status: Divorced    Spouse name: Not on file   Number of children: Not on file   Years of education: Not on file   Highest education level: Not on file  Occupational History   Not on file  Tobacco Use   Smoking status: Every Day    Packs/day: 1.00    Types: Cigars, Cigarettes   Smokeless tobacco: Current  Vaping Use   Vaping Use: Never used  Substance and  Sexual Activity   Alcohol use: Yes   Drug use: Not Currently   Sexual activity: Not Currently    Birth control/protection: Surgical  Other Topics Concern   Not on file  Social History Narrative   Not on file   Social Determinants of Health   Financial Resource Strain: Not on file  Food Insecurity: Not on file  Transportation Needs: Not on file  Physical Activity: Not on file  Stress: Not on file   Social Connections: Not on file   SDOH:  SDOH Screenings   Depression (PHQ2-9): Low Risk  (11/23/2021)  Tobacco Use: High Risk (12/14/2021)   Additional Social History:    Pain Medications: See MAR Prescriptions: See MAR Over the Counter: See MAR History of alcohol / drug use?: No history of alcohol / drug abuse Longest period of sobriety (when/how long): N/A                    Current Medications:  Current Facility-Administered Medications  Medication Dose Route Frequency Provider Last Rate Last Admin   acetaminophen (TYLENOL) tablet 650 mg  650 mg Oral Q6H PRN Rankin, Shuvon B, NP   650 mg at 12/16/21 2156   albuterol (VENTOLIN HFA) 108 (90 Base) MCG/ACT inhaler 2 puff  2 puff Inhalation Q6H PRN Rankin, Shuvon B, NP   2 puff at 12/09/21 2102   alum & mag hydroxide-simeth (MAALOX/MYLANTA) 200-200-20 MG/5ML suspension 30 mL  30 mL Oral Q4H PRN Rankin, Shuvon B, NP       apixaban (ELIQUIS) tablet 5 mg  5 mg Oral BID Rankin, Shuvon B, NP   5 mg at 12/16/21 2111   cloZAPine (CLOZARIL) tablet 50 mg  50 mg Oral Daily [name], NP   50 mg at 12/16/21 0926   cloZAPine (CLOZARIL) tablet 75 mg  75 mg Oral QHS [name], NP   75 mg at 12/16/21 2111   diltiazem (CARDIZEM CD) 24 hr capsule 240 mg  240 mg Oral Daily Rankin, Shuvon B, NP   240 mg at 12/16/21 0925   divalproex (DEPAKOTE ER) 24 hr tablet 500 mg  500 mg Oral BID Rankin, Shuvon B, NP   500 mg at 12/16/21 2112   fluticasone furoate-vilanterol (BREO ELLIPTA) 200-25 MCG/ACT 1 puff  1 puff Inhalation Daily Rankin, Shuvon B, NP   1 puff at 12/17/21 0835   haloperidol (HALDOL) tablet 10 mg  10 mg Oral Q8H PRN Rankin, Shuvon B, NP       insulin aspart (novoLOG) injection 0-9 Units  0-9 Units Subcutaneous TID WC Tharon Aquas, NP   2 Units at 12/16/21 1148   insulin glargine-yfgn (SEMGLEE) injection 10 Units  10 Units Subcutaneous Daily [name], DO   10 Units at 12/16/21 0927   latanoprost (XALATAN) 0.005 %  ophthalmic solution 1 drop  1 drop Both Eyes QHS Rankin, Shuvon B, NP   1 drop at 12/16/21 2117   magnesium hydroxide (MILK OF MAGNESIA) suspension 30 mL  30 mL Oral Daily PRN Rankin, Shuvon B, NP       menthol-cetylpyridinium (CEPACOL) lozenge 3 mg  1 lozenge Oral Once Onuoha, Chinwendu V, NP       metFORMIN (GLUCOPHAGE-XR) 24 hr tablet 1,000 mg  1,000 mg Oral BID WC [name], DO   1,000 mg at 12/17/21 0835   nicotine (NICODERM CQ - dosed in mg/24 hr) patch 7 mg  7 mg Transdermal Daily Rankin, Shuvon B, NP   7 mg at 12/16/21  0931   Vitamin D (Ergocalciferol) (DRISDOL) 1.25 MG (50000 UNIT) capsule 50,000 Units  50,000 Units Oral Q7 days Rankin, Shuvon B, NP   50,000 Units at 12/10/21 1696   Current Outpatient Medications  Medication Sig Dispense Refill   Accu-Chek Softclix Lancets lancets Use as directed up to four times daily 100 each 0   apixaban (ELIQUIS) 5 MG TABS tablet Take 1 tablet (5 mg total) by mouth 2 (two) times daily. 60 tablet 0   ARIPiprazole (ABILIFY) 10 MG tablet Take 1 tablet (10 mg total) by mouth daily. 30 tablet 0   Blood Glucose Monitoring Suppl (ACCU-CHEK GUIDE) w/Device KIT Use as directed up to four times daily 1 kit 0   budesonide-formoterol (SYMBICORT) 160-4.5 MCG/ACT inhaler Inhale 2 puffs into the lungs in the morning and at bedtime.     Cholecalciferol (VITAMIN D3) 1.25 MG (50000 UT) CAPS Take 50,000 Units by mouth every Thursday.     cloZAPine (CLOZARIL) 25 MG tablet Take 3 tablets (75 mg total) by mouth at bedtime. 90 tablet 0   clozapine (CLOZARIL) 50 MG tablet Take 1 tablet (50 mg total) by mouth daily. 30 tablet 0   diltiazem (CARDIZEM CD) 240 MG 24 hr capsule Take 1 capsule (240 mg total) by mouth daily. (Patient not taking: Reported on 11/24/2021) 30 capsule 0   divalproex (DEPAKOTE ER) 500 MG 24 hr tablet Take 1 tablet (500 mg total) by mouth 2 (two) times daily. 60 tablet 0   glucose blood test strip Use as directed up to four times daily 50 each 0    haloperidol (HALDOL) 10 MG tablet Take 1 tablet (10 mg total) by mouth 3 (three) times daily as needed for agitation (and psychotic symptoms).     INGREZZA 40 MG capsule Take 1 capsule (40 mg total) by mouth in the morning. 30 capsule 0   insulin aspart (NOVOLOG) 100 UNIT/ML FlexPen Before each meal 3 times a day, 140-199 - 2 units, 200-250 - 4 units, 251-299 - 6 units,  300-349 - 8 units,  350 or above 10 units. Insulin PEN if approved, provide syringes and needles if needed.Please switch to any approved short acting Insulin if needed. 15 mL 0   insulin glargine (LANTUS) 100 UNIT/ML Solostar Pen Inject 12 Units into the skin daily. 15 mL 0   Insulin Pen Needle 32G X 4 MM MISC Use 4 times a day with insulin, 1 month supply. 100 each 0   latanoprost (XALATAN) 0.005 % ophthalmic solution Place 1 drop into both eyes at bedtime.     metFORMIN (GLUCOPHAGE) 500 MG tablet Take 500 mg by mouth 2 (two) times daily with a meal.     nicotine (NICODERM CQ - DOSED IN MG/24 HR) 7 mg/24hr patch Place 1 patch (7 mg total) onto the skin daily. 28 patch 0   PROAIR HFA 108 (90 Base) MCG/ACT inhaler Inhale 2 puffs into the lungs every 6 (six) hours as needed for wheezing or shortness of breath.      Labs  Lab Results:  No results displayed because visit has over 200 results.    Admission on 11/22/2021, Discharged on 11/22/2021  Component Date Value Ref Range Status   Glucose-Capillary 11/22/2021 169 (H)  70 - 99 mg/dL Final   Glucose reference range applies only to samples taken after fasting for at least 8 hours.  No results displayed because visit has over 200 results.    Admission on 10/04/2021, Discharged on 10/22/2021  Component Date Value  Ref Range Status   SARS Coronavirus 2 by RT PCR 10/04/2021 NEGATIVE  NEGATIVE Final   Comment: (NOTE) SARS-CoV-2 target nucleic acids are NOT DETECTED.  The SARS-CoV-2 RNA is generally detectable in upper respiratory specimens during the acute phase of infection. The  lowest concentration of SARS-CoV-2 viral copies this assay can detect is 138 copies/mL. A negative result does not preclude SARS-Cov-2 infection and should not be used as the sole basis for treatment or other patient management decisions. A negative result may occur with  improper specimen collection/handling, submission of specimen other than nasopharyngeal swab, presence of viral mutation(s) within the areas targeted by this assay, and inadequate number of viral copies(<138 copies/mL). A negative result must be combined with clinical observations, patient history, and epidemiological information. The expected result is Negative.  Fact Sheet for Patients:  EntrepreneurPulse.com.au  Fact Sheet for Healthcare Providers:  IncredibleEmployment.be  This test is no                          t yet approved or cleared by the [place] FDA and  has been authorized for detection and/or diagnosis of SARS-CoV-2 by FDA under an Emergency Use Authorization (EUA). This EUA will remain  in effect (meaning this test can be used) for the duration of the COVID-19 declaration under Section 564(b)(1) of the Act, 21 U.S.C.section 360bbb-3(b)(1), unless the authorization is terminated  or revoked sooner.       Influenza A by PCR 10/04/2021 NEGATIVE  NEGATIVE Final   Influenza B by PCR 10/04/2021 NEGATIVE  NEGATIVE Final   Comment: (NOTE) The Xpert Xpress SARS-CoV-2/FLU/RSV plus assay is intended as an aid in the diagnosis of influenza from Nasopharyngeal swab specimens and should not be used as a sole basis for treatment. Nasal washings and aspirates are unacceptable for Xpert Xpress SARS-CoV-2/FLU/RSV testing.  Fact Sheet for Patients: EntrepreneurPulse.com.au  Fact Sheet for Healthcare Providers: IncredibleEmployment.be  This test is not yet approved or cleared by the [place] FDA and has been authorized for  detection and/or diagnosis of SARS-CoV-2 by FDA under an Emergency Use Authorization (EUA). This EUA will remain in effect (meaning this test can be used) for the duration of the COVID-19 declaration under Section 564(b)(1) of the Act, 21 U.S.C. section 360bbb-3(b)(1), unless the authorization is terminated or revoked.  Performed at [facility], [place] [address] [place], [place] [address]    WBC 10/04/2021 8.4  4.0 - 10.5 K/uL Final   RBC 10/04/2021 4.42  3.87 - 5.11 MIL/uL Final   Hemoglobin 10/04/2021 11.6 (L)  12.0 - 15.0 g/dL Final   HCT 10/04/2021 35.7 (L)  36.0 - 46.0 % Final   MCV 10/04/2021 80.8  80.0 - 100.0 fL Final   MCH 10/04/2021 26.2  26.0 - 34.0 pg Final   MCHC 10/04/2021 32.5  30.0 - 36.0 g/dL Final   RDW 10/04/2021 16.0 (H)  11.5 - 15.5 % Final   Platelets 10/04/2021 372  150 - 400 K/uL Final   nRBC 10/04/2021 0.0  0.0 - 0.2 % Final   Neutrophils Relative % 10/04/2021 68  % Final   Neutro Abs 10/04/2021 5.7  1.7 - 7.7 K/uL Final   Lymphocytes Relative 10/04/2021 22  % Final   Lymphs Abs 10/04/2021 1.8  0.7 - 4.0 K/uL Final   Monocytes Relative 10/04/2021 8  % Final   Monocytes Absolute 10/04/2021 0.7  0.1 - 1.0 K/uL Final   Eosinophils Relative 10/04/2021  1  % Final   Eosinophils Absolute 10/04/2021 0.1  0.0 - 0.5 K/uL Final   Basophils Relative 10/04/2021 1  % Final   Basophils Absolute 10/04/2021 0.1  0.0 - 0.1 K/uL Final   Immature Granulocytes 10/04/2021 0  % Final   Abs Immature Granulocytes 10/04/2021 0.03  0.00 - 0.07 K/uL Final   Performed at [facility], [place] [address], [place], [place] [address]   Sodium 10/04/2021 136  135 - 145 mmol/L Final   Potassium 10/04/2021 4.2  3.5 - 5.1 mmol/L Final   Chloride 10/04/2021 104  98 - 111 mmol/L Final   CO2 10/04/2021 25  22 - 32 mmol/L Final   Glucose, Bld 10/04/2021 100 (H)  70 - 99 mg/dL Final   Glucose reference range applies only to samples taken after fasting for at least 8 hours.   BUN  10/04/2021 9  6 - 20 mg/dL Final   Creatinine, Ser 10/04/2021 0.52  0.44 - 1.00 mg/dL Final   Calcium 10/04/2021 9.0  8.9 - 10.3 mg/dL Final   Total Protein 10/04/2021 7.0  6.5 - 8.1 g/dL Final   Albumin 10/04/2021 3.2 (L)  3.5 - 5.0 g/dL Final   AST 10/04/2021 13 (L)  15 - 41 U/L Final   ALT 10/04/2021 10  0 - 44 U/L Final   Alkaline Phosphatase 10/04/2021 61  38 - 126 U/L Final   Total Bilirubin 10/04/2021 0.3  0.3 - 1.2 mg/dL Final   GFR, Estimated 10/04/2021 >60  >60 mL/min Final   Comment: (NOTE) Calculated using the CKD-EPI Creatinine Equation (2021)    Anion gap 10/04/2021 7  5 - 15 Final   Performed at [facility] [address], [place], [place] [address]   Hgb A1c MFr Bld 10/04/2021 7.0 (H)  4.8 - 5.6 % Final   Comment: (NOTE) Pre diabetes:          5.7%-6.4%  Diabetes:              >6.4%  Glycemic control for   <7.0% adults with diabetes    Mean Plasma Glucose 10/04/2021 154.2  mg/dL Final   Performed at [facility], [place] [address], [place], [place] [address]   Cholesterol 10/04/2021 178  0 - 200 mg/dL Final   Triglycerides 10/04/2021 155 (H)  <150 mg/dL Final   HDL 10/04/2021 52  >40 mg/dL Final   Total CHOL/HDL Ratio 10/04/2021 3.4  RATIO Final   VLDL 10/04/2021 31  0 - 40 mg/dL Final   LDL Cholesterol 10/04/2021 95  0 - 99 mg/dL Final   Comment:        Total Cholesterol/HDL:CHD Risk Coronary Heart Disease Risk Table                     Men   Women  1/2 Average Risk   3.4   3.3  Average Risk       5.0   4.4  2 X Average Risk   9.6   7.1  3 X Average Risk  23.4   11.0        Use the calculated Patient Ratio above and the CHD Risk Table to determine the patient's CHD Risk.        ATP III CLASSIFICATION (LDL):  <100     mg/dL   Optimal  100-129  mg/dL   Near or Above  Optimal  130-159  mg/dL   Borderline  160-189  mg/dL   High  >190     mg/dL   Very High Performed at [facility] [address], [place], [place]  [address]    POC Amphetamine UR 10/04/2021 None Detected  NONE DETECTED (Cut Off Level 1000 ng/mL) Final   POC Secobarbital (BAR) 10/04/2021 None Detected  NONE DETECTED (Cut Off Level 300 ng/mL) Final   POC Buprenorphine (BUP) 10/04/2021 None Detected  NONE DETECTED (Cut Off Level 10 ng/mL) Final   POC Oxazepam (BZO) 10/04/2021 None Detected  NONE DETECTED (Cut Off Level 300 ng/mL) Final   POC Cocaine UR 10/04/2021 None Detected  NONE DETECTED (Cut Off Level 300 ng/mL) Final   POC Methamphetamine UR 10/04/2021 None Detected  NONE DETECTED (Cut Off Level 1000 ng/mL) Final   POC Morphine 10/04/2021 None Detected  NONE DETECTED (Cut Off Level 300 ng/mL) Final   POC Methadone UR 10/04/2021 None Detected  NONE DETECTED (Cut Off Level 300 ng/mL) Final   POC Oxycodone UR 10/04/2021 Positive (A)  NONE DETECTED (Cut Off Level 100 ng/mL) Final   POC Marijuana UR 10/04/2021 None Detected  NONE DETECTED (Cut Off Level 50 ng/mL) Final   SARSCOV2ONAVIRUS 2 AG 10/04/2021 NEGATIVE  NEGATIVE Final   Comment: (NOTE) SARS-CoV-2 antigen NOT DETECTED.   Negative results are presumptive.  Negative results do not preclude SARS-CoV-2 infection and should not be used as the sole basis for treatment or other patient management decisions, including infection  control decisions, particularly in the presence of clinical signs and  symptoms consistent with COVID-19, or in those who have been in contact with the virus.  Negative results must be combined with clinical observations, patient history, and epidemiological information. The expected result is Negative.  Fact Sheet for Patients: HandmadeRecipes.com.cy  Fact Sheet for Healthcare Providers: FuneralLife.at  This test is not yet approved or cleared by the [place] FDA and  has been authorized for detection and/or diagnosis of SARS-CoV-2 by FDA under an Emergency Use Authorization (EUA).  This EUA will remain in  effect (meaning this test can be used) for the duration of  the COV                          ID-19 declaration under Section 564(b)(1) of the Act, 21 U.S.C. section 360bbb-3(b)(1), unless the authorization is terminated or revoked sooner.     Valproic Acid Lvl 10/04/2021 51  50.0 - 100.0 ug/mL Final   Performed at [facility] [address], [place], [place] [address]   Valproic Acid Lvl 10/08/2021 57  50.0 - 100.0 ug/mL Final   Performed at [facility] [address], [place], [place] [address]   Glucose-Capillary 10/09/2021 123 (H)  70 - 99 mg/dL Final   Glucose reference range applies only to samples taken after fasting for at least 8 hours.  Admission on 09/09/2021, Discharged on 09/10/2021  Component Date Value Ref Range Status   Sodium 09/09/2021 134 (L)  135 - 145 mmol/L Final   Potassium 09/09/2021 4.3  3.5 - 5.1 mmol/L Final   Chloride 09/09/2021 99  98 - 111 mmol/L Final   CO2 09/09/2021 25  22 - 32 mmol/L Final   Glucose, Bld 09/09/2021 107 (H)  70 - 99 mg/dL Final   Glucose reference range applies only to samples taken after fasting for at least 8 hours.   BUN 09/09/2021 12  6 - 20 mg/dL Final   Creatinine, Ser 09/09/2021 0.74  0.44 - 1.00 mg/dL Final   Calcium 09/09/2021 9.0  8.9 - 10.3 mg/dL Final   Total Protein 09/09/2021 7.4  6.5 - 8.1 g/dL Final   Albumin 09/09/2021 3.1 (L)  3.5 - 5.0 g/dL Final   AST 09/09/2021 13 (L)  15 - 41 U/L Final   ALT 09/09/2021 13  0 - 44 U/L Final   Alkaline Phosphatase 09/09/2021 66  38 - 126 U/L Final   Total Bilirubin 09/09/2021 0.2 (L)  0.3 - 1.2 mg/dL Final   GFR, Estimated 09/09/2021 >60  >60 mL/min Final   Comment: (NOTE) Calculated using the CKD-EPI Creatinine Equation (2021)    Anion gap 09/09/2021 10  5 - 15 Final   Performed at [facility], [place] [address], [place], [place] [address]   WBC 09/09/2021 8.5  4.0 - 10.5 K/uL Final   RBC 09/09/2021 4.53  3.87 - 5.11 MIL/uL Final   Hemoglobin 09/09/2021  11.8 (L)  12.0 - 15.0 g/dL Final   HCT 09/09/2021 38.0  36.0 - 46.0 % Final   MCV 09/09/2021 83.9  80.0 - 100.0 fL Final   MCH 09/09/2021 26.0  26.0 - 34.0 pg Final   MCHC 09/09/2021 31.1  30.0 - 36.0 g/dL Final   RDW 09/09/2021 17.8 (H)  11.5 - 15.5 % Final   Platelets 09/09/2021 442 (H)  150 - 400 K/uL Final   nRBC 09/09/2021 0.0  0.0 - 0.2 % Final   Neutrophils Relative % 09/09/2021 70  % Final   Neutro Abs 09/09/2021 5.9  1.7 - 7.7 K/uL Final   Lymphocytes Relative 09/09/2021 20  % Final   Lymphs Abs 09/09/2021 1.7  0.7 - 4.0 K/uL Final   Monocytes Relative 09/09/2021 8  % Final   Monocytes Absolute 09/09/2021 0.7  0.1 - 1.0 K/uL Final   Eosinophils Relative 09/09/2021 1  % Final   Eosinophils Absolute 09/09/2021 0.1  0.0 - 0.5 K/uL Final   Basophils Relative 09/09/2021 1  % Final   Basophils Absolute 09/09/2021 0.1  0.0 - 0.1 K/uL Final   Immature Granulocytes 09/09/2021 0  % Final   Abs Immature Granulocytes 09/09/2021 0.03  0.00 - 0.07 K/uL Final   Performed at [facility], [place] [address], [place], [place] [address]   Ammonia 09/09/2021 37 (H)  9 - 35 umol/L Final   Comment: HEMOLYSIS AT THIS LEVEL MAY AFFECT RESULT Performed at [facility], [place] [address], [place], [place] [address]    Opiates 09/09/2021 NONE DETECTED  NONE DETECTED Final   Cocaine 09/09/2021 NONE DETECTED  NONE DETECTED Final   Benzodiazepines 09/09/2021 NONE DETECTED  NONE DETECTED Final   Amphetamines 09/09/2021 NONE DETECTED  NONE DETECTED Final   Tetrahydrocannabinol 09/09/2021 NONE DETECTED  NONE DETECTED Final   Barbiturates 09/09/2021 NONE DETECTED  NONE DETECTED Final   Comment: (NOTE) DRUG SCREEN FOR MEDICAL PURPOSES ONLY.  IF CONFIRMATION IS NEEDED FOR ANY PURPOSE, NOTIFY LAB WITHIN 5 DAYS.  LOWEST DETECTABLE LIMITS FOR URINE DRUG SCREEN Drug Class                     Cutoff (ng/mL) Amphetamine and metabolites    1000 Barbiturate and metabolites    200 Benzodiazepine                  106 Tricyclics and metabolites     300 Opiates and metabolites  300 Cocaine and metabolites        300 THC                            50 Performed at [facility], [place] [address] [place], [place] [address]    Alcohol, Ethyl (B) 09/09/2021 <10  <10 mg/dL Final   Comment: (NOTE) Lowest detectable limit for serum alcohol is 10 mg/dL.  For medical purposes only. Performed at [facility], [place] [address] [place], [place] [address]    Lipase 09/09/2021 33  11 - 51 U/L Final   Performed at [facility] [address] [place], [place] [address]   Color, Urine 09/09/2021 COLORLESS (A)  YELLOW Final   APPearance 09/09/2021 CLEAR  CLEAR Final   Specific Gravity, Urine 09/09/2021 1.002 (L)  1.005 - 1.030 Final   pH 09/09/2021 6.0  5.0 - 8.0 Final   Glucose, UA 09/09/2021 NEGATIVE  NEGATIVE mg/dL Final   Hgb urine dipstick 09/09/2021 NEGATIVE  NEGATIVE Final   Bilirubin Urine 09/09/2021 NEGATIVE  NEGATIVE Final   Ketones, ur 09/09/2021 NEGATIVE  NEGATIVE mg/dL Final   Protein, ur 09/09/2021 NEGATIVE  NEGATIVE mg/dL Final   Nitrite 09/09/2021 NEGATIVE  NEGATIVE Final   Leukocytes,Ua 09/09/2021 NEGATIVE  NEGATIVE Final   Performed at [facility] [address] [place], [place] [address]   Specimen Description 09/09/2021 URINE, CLEAN CATCH   Final   Special Requests 09/09/2021 NONE   Final   Culture 09/09/2021  (A)   Final                   Value:<10,000 COLONIES/mL INSIGNIFICANT GROWTH Performed at [facility], [place] [address], [place], [place] [address]    Report Status 09/09/2021 09/10/2021 FINAL   Final   D-Dimer, Quant 09/09/2021 0.92 (H)  0.00 - 0.50 ug/mL-FEU Final   Comment: (NOTE) At the manufacturer cut-off value of 0.5 g/mL FEU, this assay has a negative predictive value of 95-100%.This assay is intended for use in conjunction with a clinical pretest probability (PTP) assessment model to exclude pulmonary embolism (PE) and deep  venous thrombosis (DVT) in outpatients suspected of PE or DVT. Results should be correlated with clinical presentation. Performed at [facility], [place] [address] [place], [place] [address]    Troponin I (High Sensitivity) 09/09/2021 5  <18 ng/L Final   Comment: (NOTE) Elevated high sensitivity troponin I (hsTnI) values and significant  changes across serial measurements may suggest ACS but many other  chronic and acute conditions are known to elevate hsTnI results.  Refer to the "Links" section for chest pain algorithms and additional  guidance. Performed at [facility], [place] [address], [place], [place] [address]    BP 09/10/2021 135/83  mmHg Final   S' Lateral 09/10/2021 2.30  cm Final   Area-P 1/2 09/10/2021 5.58  cm2 Final    Blood Alcohol level:  Lab Results  Component Value Date   ETH <10 11/23/2021   ETH <10 36/64/4034    Metabolic Disorder Labs: Lab Results  Component Value Date   HGBA1C 7.0 (H) 10/04/2021   MPG 154.2 10/04/2021   Lab Results  Component Value Date   PROLACTIN 3.8 (L) 11/23/2021   Lab Results  Component Value Date   CHOL 171 11/23/2021   TRIG 125 11/23/2021   HDL 65 11/23/2021   CHOLHDL 2.6 11/23/2021  VLDL 25 11/23/2021   LDLCALC 81 11/23/2021   LDLCALC 95 10/04/2021    Therapeutic Lab Levels: No results found for: "LITHIUM" Lab Results  Component Value Date   VALPROATE 54 12/03/2021   VALPROATE 45 (L) 11/09/2021   No results found for: "CBMZ"  Physical Findings   PHQ2-9    [facility]/23/[facility] in [facility]  PHQ-2 Total Score 2  PHQ-9 Total Score 3      Flowsheet Row ED from 11/23/2021 in [facility] Most recent reading at 11/23/2021  6:44 PM ED from 11/22/2021 in [facility] Most recent reading at 11/22/2021  8:28 PM ED from 11/22/2021 in [facility] Most recent reading at  11/22/2021  1:24 AM  C-SSRS RISK CATEGORY No Risk No Risk No Risk        Musculoskeletal  Strength & Muscle Tone: within normal limits Gait & Station: normal Patient leans: N/A  Psychiatric Specialty Exam  Presentation  General Appearance:  Appropriate for Environment; Casual  Eye Contact: Fair  Speech: Clear and Coherent; Normal Rate  Speech Volume: Decreased  Handedness: Left  Mood and Affect  Mood: Euthymic  Affect: Appropriate; Congruent; Constricted   Thought Process  Thought Processes: Coherent; Goal Directed; Linear  Descriptions of Associations:Intact  Orientation:Full (Time, Place and Person)  Thought Content:Logical; WDL  Diagnosis of Schizophrenia or Schizoaffective disorder in past: Yes  Hallucinations:Hallucinations: None   Ideas of Reference:None  Suicidal Thoughts:Suicidal Thoughts: No (Contracted to safety)   Homicidal Thoughts:Homicidal Thoughts: No   Sensorium  Memory: Immediate Good  Judgment: Fair  Insight: Fair   Executive Functions  Concentration: Good  Attention Span: Good  Recall: Good  Fund of Knowledge: Fair  Language: Good   Psychomotor Activity  Psychomotor Activity: Psychomotor Activity: Normal    Assets  Assets: Communication Skills; Desire for Improvement; Social Support   Sleep  Sleep: Sleep: Good   Physical Exam  Physical Exam Vitals and nursing note reviewed.  Constitutional:      General: She is not in acute distress.    Appearance: She is not ill-appearing or diaphoretic.  HENT:     Head: Normocephalic and atraumatic.  Pulmonary:     Effort: Pulmonary effort is normal. No respiratory distress.  Neurological:     General: No focal deficit present.     Mental Status: She is alert.    Blood pressure 138/83, pulse (!) 105, temperature 98.8 F (37.1 C), temperature source Oral, resp. rate 18, SpO2 99 %. There is no height or weight on file to calculate BMI.  Treatment  Plan Summary: Teresa Murillo remains cleared by psychiatry.   Facility [facility] team along with [facility] health care manager and case manager and Adult [employer] representative continue to seek placement for Teresa Murillo.  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 apixaban, 5 mg, BID clozapine, 50 mg, Daily cloZAPine, 75 mg, QHS diltiazem, 240 mg, Daily divalproex, 500 mg, BID fluticasone furoate-vilanterol, 1 puff, Daily insulin aspart, 0-9 Units, TID WC insulin glargine-yfgn, 10 Units, Daily latanoprost, 1 drop, QHS menthol-cetylpyridinium, 1 lozenge, Once metFORMIN, 1,000 mg, BID WC nicotine, 7 mg, Daily Vitamin D (Ergocalciferol), 50,000 Units, Q7 days    [name], DO 12/17/2021 8:55 AM

## 2021-12-18 DIAGNOSIS — F200 Paranoid schizophrenia: Secondary | ICD-10-CM | POA: Diagnosis not present

## 2021-12-18 LAB — GLUCOSE, CAPILLARY
Glucose-Capillary: 97 mg/dL (ref 70–99)
Glucose-Capillary: 111 mg/dL — ABNORMAL HIGH (ref 70–99)
Glucose-Capillary: 149 mg/dL — ABNORMAL HIGH (ref 70–99)

## 2021-12-18 NOTE — ED Notes (Signed)
Pt sleeping at present, no distress noted.  Monitoring for safety. 

## 2021-12-18 NOTE — ED Notes (Signed)
Patient sitting in chair with no acute distress. Given breakfast. No insulin needed this morning due to BG being in normal range. Denies SI/HI/AVH.

## 2021-12-18 NOTE — ED Notes (Signed)
Pt A&O x 4, no distress noted, watching TV at present. Pt calm & cooperative.  Monitoring for safety.

## 2021-12-18 NOTE — ED Notes (Signed)
Patient has been cooperative this shift. Denies SI/HI/AVH. Takes redirection appropriately.

## 2021-12-18 NOTE — Progress Notes (Signed)
LCSW Progress Note  [id number] - LCSW contacted Beckie Salts, [phone], from [place] to get an update on his estimated time of arrival to meet with the pt and start the process for obtaining housing/placement. LCSW was informed by Mr. Littie Deeds that he was tied up in a company-wide meeting and left afterwards.  He then stated that on his way here, he received an email from [facility] to take the rest of the day off, so he turned around and headed back home.  He stated he would reschedule next week.  LCSW will contact Ava Nancie Neas to update her on this as LCSW will be absent.   Omelia Blackwater, MSW, [place] phone

## 2021-12-18 NOTE — ED Provider Notes (Cosign Needed Addendum)
Behavioral Health Progress Note  Date and Time: 12/18/2021 10:47 AM Name: Teresa Murillo MRN:  [id number]  HPI: Teresa Murillo is a 61 y.o. female with a past psychiatric history of schizophrenia, aggressive behavior, and possible intellectual disability presenting to [facility] on 11/23/21 voluntarily as a walk in via [facility] with complaints that she was locked out of the group home. Patient has been dismissed from group home due to multiple elopements. Pt had already been discharged from this facility but had been living there temporarily while DSS was looking for new placement. Pt is boarding at [facility].  Completed an AID to Capacity Evaluation (ACE) with attending Dr. Dwyane Dee on 12/11/2021.  Please see media tab for full details.   Completed test with [name], PhD: Wechsler Adult Intelligence Scale-4, Ms. Teresa Murillo achieved a full-scale IQ score of 73 and a percentile rank of 4 placing her in the borderline range of intellectual functioning (12/14/2021) Please see consult note from [name], PhD on 12/14/2021  Subjective:  Patient was seen initially awake sitting up in bed, comfortable. Patient stated she is ready to meet social worker later this afternoon.  Reported she is doing "good".  Stated she is ready to go home.  Discussed with patient that we are still looking for housing.  Patient states she understood.  Otherwise patient had no other questions or concerns.  AQ:[id number] Thoughts: No (Contracted to safety) LK:[id number] Thoughts: No TDS:[id number]: None Ideas of TDH:RCBU   Mood: Euthymic Sleep:Good Appetite: Good   Review of Systems  Constitutional:        Denied pain  HENT:  Negative for sore throat.   Respiratory:  Negative for shortness of breath.   Cardiovascular:  Negative for chest pain.  Gastrointestinal:  Negative for abdominal pain, constipation and diarrhea.  Musculoskeletal:  Negative for myalgias.  Neurological:  Negative for dizziness, tremors  and headaches.    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 On the Wechsler Adult Intelligence Scale-4, Teresa Murillo achieved a full-scale IQ score of 73 and a percentile rank of 4 placing her in the borderline range of intellectual functioning.    Chronic obstructive pulmonary disease (COPD) (HCC)    Glaucoma    Hyperlipidemia    Hypertension    Iron deficiency    Schizoaffective disorder (HCC)    Type 2 diabetes mellitus (HCC)     Past Surgical History:  Procedure Laterality Date   TUBAL LIGATION     Family History:  Family History  Problem Relation Age of Onset   Breast cancer Maternal Grandmother    Family Psychiatric  History: none reported Social History:  Social History   Substance and Sexual Activity  Alcohol Use Yes     Social History   Substance and Sexual Activity  Drug Use Not Currently    Social History   Socioeconomic History   Marital status: Divorced    Spouse name: Not on file   Number of children: Not on file   Years of education: Not on file   Highest education level: Not on file  Occupational History   Not on file  Tobacco Use   Smoking status: Every Day  Packs/day: 1.00    Types: Cigars, Cigarettes   Smokeless tobacco: Current  Vaping Use   Vaping Use: Never used  Substance and Sexual Activity   Alcohol use: Yes   Drug use: Not Currently   Sexual activity: Not Currently    Birth control/protection: Surgical  Other Topics Concern   Not on file  Social History Narrative   Not on file   Social Determinants of Health   Financial Resource  Strain: Not on file  Food Insecurity: Not on file  Transportation Needs: Not on file  Physical Activity: Not on file  Stress: Not on file  Social Connections: Not on file   SDOH:  SDOH Screenings   Depression (PHQ2-9): Low Risk  (11/23/2021)  Tobacco Use: High Risk (12/14/2021)   Additional Social History:    Pain Medications: See MAR Prescriptions: See MAR Over the Counter: See MAR History of alcohol / drug use?: No history of alcohol / drug abuse Longest period of sobriety (when/how long): N/A                    Current Medications:  Current Facility-Administered Medications  Medication Dose Route Frequency Provider Last Rate Last Admin   acetaminophen (TYLENOL) tablet 650 mg  650 mg Oral Q6H PRN Rankin, Shuvon B, NP   650 mg at 12/16/21 2156   albuterol (VENTOLIN HFA) 108 (90 Base) MCG/ACT inhaler 2 puff  2 puff Inhalation Q6H PRN Rankin, Shuvon B, NP   2 puff at 12/09/21 2102   alum & mag hydroxide-simeth (MAALOX/MYLANTA) 200-200-20 MG/5ML suspension 30 mL  30 mL Oral Q4H PRN Rankin, Shuvon B, NP       apixaban (ELIQUIS) tablet 5 mg  5 mg Oral BID Rankin, Shuvon B, NP   5 mg at 12/18/21 0948   cloZAPine (CLOZARIL) tablet 50 mg  50 mg Oral Daily [name], NP   50 mg at 12/18/21 0947   cloZAPine (CLOZARIL) tablet 75 mg  75 mg Oral QHS [name], NP   75 mg at 12/17/21 2137   diltiazem (CARDIZEM CD) 24 hr capsule 240 mg  240 mg Oral Daily Rankin, Shuvon B, NP   240 mg at 12/18/21 0947   divalproex (DEPAKOTE ER) 24 hr tablet 500 mg  500 mg Oral BID Rankin, Shuvon B, NP   500 mg at 12/18/21 0948   fluticasone furoate-vilanterol (BREO ELLIPTA) 200-25 MCG/ACT 1 puff  1 puff Inhalation Daily Rankin, Shuvon B, NP   1 puff at 12/18/21 0752   haloperidol (HALDOL) tablet 10 mg  10 mg Oral Q8H PRN Rankin, Shuvon B, NP       insulin aspart (novoLOG) injection 0-9 Units  0-9 Units Subcutaneous TID WC Tharon Aquas, NP   2 Units at 12/17/21 1128   insulin glargine-yfgn  (SEMGLEE) injection 10 Units  10 Units Subcutaneous Daily [name], DO   10 Units at 12/18/21 0947   latanoprost (XALATAN) 0.005 % ophthalmic solution 1 drop  1 drop Both Eyes QHS Rankin, Shuvon B, NP   1 drop at 12/17/21 2140   magnesium hydroxide (MILK OF MAGNESIA) suspension 30 mL  30 mL Oral Daily PRN Rankin, Shuvon B, NP       menthol-cetylpyridinium (CEPACOL) lozenge 3 mg  1 lozenge Oral Once Onuoha, Chinwendu V, NP       metFORMIN (GLUCOPHAGE-XR) 24 hr tablet 1,000 mg  1,000 mg Oral BID WC [name], DO   1,000 mg at 12/18/21 0752   nicotine (  NICODERM CQ - dosed in mg/24 hr) patch 7 mg  7 mg Transdermal Daily Rankin, Shuvon B, NP   7 mg at 12/18/21 1610   Vitamin D (Ergocalciferol) (DRISDOL) 1.25 MG (50000 UNIT) capsule 50,000 Units  50,000 Units Oral Q7 days Rankin, Shuvon B, NP   50,000 Units at 12/17/21 0930   Current Outpatient Medications  Medication Sig Dispense Refill   Accu-Chek Softclix Lancets lancets Use as directed up to four times daily 100 each 0   apixaban (ELIQUIS) 5 MG TABS tablet Take 1 tablet (5 mg total) by mouth 2 (two) times daily. 60 tablet 0   ARIPiprazole (ABILIFY) 10 MG tablet Take 1 tablet (10 mg total) by mouth daily. 30 tablet 0   Blood Glucose Monitoring Suppl (ACCU-CHEK GUIDE) w/Device KIT Use as directed up to four times daily 1 kit 0   budesonide-formoterol (SYMBICORT) 160-4.5 MCG/ACT inhaler Inhale 2 puffs into the lungs in the morning and at bedtime.     Cholecalciferol (VITAMIN D3) 1.25 MG (50000 UT) CAPS Take 50,000 Units by mouth every Thursday.     cloZAPine (CLOZARIL) 25 MG tablet Take 3 tablets (75 mg total) by mouth at bedtime. 90 tablet 0   clozapine (CLOZARIL) 50 MG tablet Take 1 tablet (50 mg total) by mouth daily. 30 tablet 0   diltiazem (CARDIZEM CD) 240 MG 24 hr capsule Take 1 capsule (240 mg total) by mouth daily. (Patient not taking: Reported on 11/24/2021) 30 capsule 0   divalproex (DEPAKOTE ER) 500 MG 24 hr tablet Take 1 tablet  (500 mg total) by mouth 2 (two) times daily. 60 tablet 0   glucose blood test strip Use as directed up to four times daily 50 each 0   haloperidol (HALDOL) 10 MG tablet Take 1 tablet (10 mg total) by mouth 3 (three) times daily as needed for agitation (and psychotic symptoms).     INGREZZA 40 MG capsule Take 1 capsule (40 mg total) by mouth in the morning. 30 capsule 0   insulin aspart (NOVOLOG) 100 UNIT/ML FlexPen Before each meal 3 times a day, 140-199 - 2 units, 200-250 - 4 units, 251-299 - 6 units,  300-349 - 8 units,  350 or above 10 units. Insulin PEN if approved, provide syringes and needles if needed.Please switch to any approved short acting Insulin if needed. 15 mL 0   insulin glargine (LANTUS) 100 UNIT/ML Solostar Pen Inject 12 Units into the skin daily. 15 mL 0   Insulin Pen Needle 32G X 4 MM MISC Use 4 times a day with insulin, 1 month supply. 100 each 0   latanoprost (XALATAN) 0.005 % ophthalmic solution Place 1 drop into both eyes at bedtime.     metFORMIN (GLUCOPHAGE) 500 MG tablet Take 500 mg by mouth 2 (two) times daily with a meal.     nicotine (NICODERM CQ - DOSED IN MG/24 HR) 7 mg/24hr patch Place 1 patch (7 mg total) onto the skin daily. 28 patch 0   PROAIR HFA 108 (90 Base) MCG/ACT inhaler Inhale 2 puffs into the lungs every 6 (six) hours as needed for wheezing or shortness of breath.      Labs  Lab Results:  No results displayed because visit has over 200 results.    Admission on 11/22/2021, Discharged on 11/22/2021  Component Date Value Ref Range Status   Glucose-Capillary 11/22/2021 169 (H)  70 - 99 mg/dL Final   Glucose reference range applies only to samples taken after fasting for at least  8 hours.  No results displayed because visit has over 200 results.    Admission on 10/04/2021, Discharged on 10/22/2021  Component Date Value Ref Range Status   SARS Coronavirus 2 by RT PCR 10/04/2021 NEGATIVE  NEGATIVE Final   Comment: (NOTE) SARS-CoV-2 target nucleic acids  are NOT DETECTED.  The SARS-CoV-2 RNA is generally detectable in upper respiratory specimens during the acute phase of infection. The lowest concentration of SARS-CoV-2 viral copies this assay can detect is 138 copies/mL. A negative result does not preclude SARS-Cov-2 infection and should not be used as the sole basis for treatment or other patient management decisions. A negative result may occur with  improper specimen collection/handling, submission of specimen other than nasopharyngeal swab, presence of viral mutation(s) within the areas targeted by this assay, and inadequate number of viral copies(<138 copies/mL). A negative result must be combined with clinical observations, patient history, and epidemiological information. The expected result is Negative.  Fact Sheet for Patients:  EntrepreneurPulse.com.au  Fact Sheet for Healthcare Providers:  IncredibleEmployment.be  This test is no                          t yet approved or cleared by the [place] FDA and  has been authorized for detection and/or diagnosis of SARS-CoV-2 by FDA under an Emergency Use Authorization (EUA). This EUA will remain  in effect (meaning this test can be used) for the duration of the COVID-19 declaration under Section 564(b)(1) of the Act, 21 U.S.C.section 360bbb-3(b)(1), unless the authorization is terminated  or revoked sooner.       Influenza A by PCR 10/04/2021 NEGATIVE  NEGATIVE Final   Influenza B by PCR 10/04/2021 NEGATIVE  NEGATIVE Final   Comment: (NOTE) The Xpert Xpress SARS-CoV-2/FLU/RSV plus assay is intended as an aid in the diagnosis of influenza from Nasopharyngeal swab specimens and should not be used as a sole basis for treatment. Nasal washings and aspirates are unacceptable for Xpert Xpress SARS-CoV-2/FLU/RSV testing.  Fact Sheet for Patients: EntrepreneurPulse.com.au  Fact Sheet for Healthcare  Providers: IncredibleEmployment.be  This test is not yet approved or cleared by the [place] FDA and has been authorized for detection and/or diagnosis of SARS-CoV-2 by FDA under an Emergency Use Authorization (EUA). This EUA will remain in effect (meaning this test can be used) for the duration of the COVID-19 declaration under Section 564(b)(1) of the Act, 21 U.S.C. section 360bbb-3(b)(1), unless the authorization is terminated or revoked.  Performed at [facility], [place] [address], [place], [place] [address]    WBC 10/04/2021 8.4  4.0 - 10.5 K/uL Final   RBC 10/04/2021 4.42  3.87 - 5.11 MIL/uL Final   Hemoglobin 10/04/2021 11.6 (L)  12.0 - 15.0 g/dL Final   HCT 10/04/2021 35.7 (L)  36.0 - 46.0 % Final   MCV 10/04/2021 80.8  80.0 - 100.0 fL Final   MCH 10/04/2021 26.2  26.0 - 34.0 pg Final   MCHC 10/04/2021 32.5  30.0 - 36.0 g/dL Final   RDW 10/04/2021 16.0 (H)  11.5 - 15.5 % Final   Platelets 10/04/2021 372  150 - 400 K/uL Final   nRBC 10/04/2021 0.0  0.0 - 0.2 % Final   Neutrophils Relative % 10/04/2021 68  % Final   Neutro Abs 10/04/2021 5.7  1.7 - 7.7 K/uL Final   Lymphocytes Relative 10/04/2021 22  % Final   Lymphs Abs 10/04/2021 1.8  0.7 - 4.0 K/uL Final  Monocytes Relative 10/04/2021 8  % Final   Monocytes Absolute 10/04/2021 0.7  0.1 - 1.0 K/uL Final   Eosinophils Relative 10/04/2021 1  % Final   Eosinophils Absolute 10/04/2021 0.1  0.0 - 0.5 K/uL Final   Basophils Relative 10/04/2021 1  % Final   Basophils Absolute 10/04/2021 0.1  0.0 - 0.1 K/uL Final   Immature Granulocytes 10/04/2021 0  % Final   Abs Immature Granulocytes 10/04/2021 0.03  0.00 - 0.07 K/uL Final   Performed at [facility], [place] [address] [place], [place] [address]   Sodium 10/04/2021 136  135 - 145 mmol/L Final   Potassium 10/04/2021 4.2  3.5 - 5.1 mmol/L Final   Chloride 10/04/2021 104  98 - 111 mmol/L Final   CO2 10/04/2021 25  22 - 32 mmol/L Final    Glucose, Bld 10/04/2021 100 (H)  70 - 99 mg/dL Final   Glucose reference range applies only to samples taken after fasting for at least 8 hours.   BUN 10/04/2021 9  6 - 20 mg/dL Final   Creatinine, Ser 10/04/2021 0.52  0.44 - 1.00 mg/dL Final   Calcium 10/04/2021 9.0  8.9 - 10.3 mg/dL Final   Total Protein 10/04/2021 7.0  6.5 - 8.1 g/dL Final   Albumin 10/04/2021 3.2 (L)  3.5 - 5.0 g/dL Final   AST 10/04/2021 13 (L)  15 - 41 U/L Final   ALT 10/04/2021 10  0 - 44 U/L Final   Alkaline Phosphatase 10/04/2021 61  38 - 126 U/L Final   Total Bilirubin 10/04/2021 0.3  0.3 - 1.2 mg/dL Final   GFR, Estimated 10/04/2021 >60  >60 mL/min Final   Comment: (NOTE) Calculated using the CKD-EPI Creatinine Equation (2021)    Anion gap 10/04/2021 7  5 - 15 Final   Performed at [facility] [address] [place], [place] [address]   Hgb A1c MFr Bld 10/04/2021 7.0 (H)  4.8 - 5.6 % Final   Comment: (NOTE) Pre diabetes:          5.7%-6.4%  Diabetes:              >6.4%  Glycemic control for   <7.0% adults with diabetes    Mean Plasma Glucose 10/04/2021 154.2  mg/dL Final   Performed at [facility], [place] [address] [place], [place] [address]   Cholesterol 10/04/2021 178  0 - 200 mg/dL Final   Triglycerides 10/04/2021 155 (H)  <150 mg/dL Final   HDL 10/04/2021 52  >40 mg/dL Final   Total CHOL/HDL Ratio 10/04/2021 3.4  RATIO Final   VLDL 10/04/2021 31  0 - 40 mg/dL Final   LDL Cholesterol 10/04/2021 95  0 - 99 mg/dL Final   Comment:        Total Cholesterol/HDL:CHD Risk Coronary Heart Disease Risk Table                     Men   Women  1/2 Average Risk   3.4   3.3  Average Risk       5.0   4.4  2 X Average Risk   9.6   7.1  3 X Average Risk  23.4   11.0        Use the calculated Patient Ratio above and the CHD Risk Table to determine the patient's CHD Risk.        ATP III CLASSIFICATION (LDL):  <100     mg/dL  Optimal  100-129  mg/dL   Near or Above                     Optimal  130-159  mg/dL   Borderline  160-189  mg/dL   High  >190     mg/dL   Very High Performed at [facility] [address], [place], [place] [address]    POC Amphetamine UR 10/04/2021 None Detected  NONE DETECTED (Cut Off Level 1000 ng/mL) Final   POC Secobarbital (BAR) 10/04/2021 None Detected  NONE DETECTED (Cut Off Level 300 ng/mL) Final   POC Buprenorphine (BUP) 10/04/2021 None Detected  NONE DETECTED (Cut Off Level 10 ng/mL) Final   POC Oxazepam (BZO) 10/04/2021 None Detected  NONE DETECTED (Cut Off Level 300 ng/mL) Final   POC Cocaine UR 10/04/2021 None Detected  NONE DETECTED (Cut Off Level 300 ng/mL) Final   POC Methamphetamine UR 10/04/2021 None Detected  NONE DETECTED (Cut Off Level 1000 ng/mL) Final   POC Morphine 10/04/2021 None Detected  NONE DETECTED (Cut Off Level 300 ng/mL) Final   POC Methadone UR 10/04/2021 None Detected  NONE DETECTED (Cut Off Level 300 ng/mL) Final   POC Oxycodone UR 10/04/2021 Positive (A)  NONE DETECTED (Cut Off Level 100 ng/mL) Final   POC Marijuana UR 10/04/2021 None Detected  NONE DETECTED (Cut Off Level 50 ng/mL) Final   SARSCOV2ONAVIRUS 2 AG 10/04/2021 NEGATIVE  NEGATIVE Final   Comment: (NOTE) SARS-CoV-2 antigen NOT DETECTED.   Negative results are presumptive.  Negative results do not preclude SARS-CoV-2 infection and should not be used as the sole basis for treatment or other patient management decisions, including infection  control decisions, particularly in the presence of clinical signs and  symptoms consistent with COVID-19, or in those who have been in contact with the virus.  Negative results must be combined with clinical observations, patient history, and epidemiological information. The expected result is Negative.  Fact Sheet for Patients: HandmadeRecipes.com.cy  Fact Sheet for Healthcare Providers: FuneralLife.at  This test is not yet approved or cleared by the  [place] FDA and  has been authorized for detection and/or diagnosis of SARS-CoV-2 by FDA under an Emergency Use Authorization (EUA).  This EUA will remain in effect (meaning this test can be used) for the duration of  the COV                          ID-19 declaration under Section 564(b)(1) of the Act, 21 U.S.C. section 360bbb-3(b)(1), unless the authorization is terminated or revoked sooner.     Valproic Acid Lvl 10/04/2021 51  50.0 - 100.0 ug/mL Final   Performed at [facility] [address] [place] [address], [place], [place] [address]   Valproic Acid Lvl 10/08/2021 57  50.0 - 100.0 ug/mL Final   Performed at [facility] [address], [place], [place] [address]   Glucose-Capillary 10/09/2021 123 (H)  70 - 99 mg/dL Final   Glucose reference range applies only to samples taken after fasting for at least 8 hours.  Admission on 09/09/2021, Discharged on 09/10/2021  Component Date Value Ref Range Status   Sodium 09/09/2021 134 (L)  135 - 145 mmol/L Final   Potassium 09/09/2021 4.3  3.5 - 5.1 mmol/L Final   Chloride 09/09/2021 99  98 - 111 mmol/L Final   CO2 09/09/2021 25  22 - 32 mmol/L Final   Glucose, Bld 09/09/2021 107 (H)  70 - 99 mg/dL Final   Glucose reference range applies only to samples taken after fasting for at least 8 hours.   BUN 09/09/2021 12  6 - 20 mg/dL Final   Creatinine, Ser 09/09/2021 0.74  0.44 - 1.00 mg/dL Final   Calcium 09/09/2021 9.0  8.9 - 10.3 mg/dL Final   Total Protein 09/09/2021 7.4  6.5 - 8.1 g/dL Final   Albumin 09/09/2021 3.1 (L)  3.5 - 5.0 g/dL Final   AST 09/09/2021 13 (L)  15 - 41 U/L Final   ALT 09/09/2021 13  0 - 44 U/L Final   Alkaline Phosphatase 09/09/2021 66  38 - 126 U/L Final   Total Bilirubin 09/09/2021 0.2 (L)  0.3 - 1.2 mg/dL Final   GFR, Estimated 09/09/2021 >60  >60 mL/min Final   Comment: (NOTE) Calculated using the CKD-EPI Creatinine Equation (2021)    Anion gap 09/09/2021 10  5 - 15 Final   Performed at [facility], [place] [address], [place], [place] [address]   WBC 09/09/2021 8.5  4.0 - 10.5 K/uL Final   RBC 09/09/2021 4.53  3.87 - 5.11 MIL/uL Final   Hemoglobin 09/09/2021 11.8 (L)  12.0 - 15.0 g/dL Final   HCT 09/09/2021 38.0  36.0 - 46.0 % Final   MCV 09/09/2021 83.9  80.0 - 100.0 fL Final   MCH 09/09/2021 26.0  26.0 - 34.0 pg Final   MCHC 09/09/2021 31.1  30.0 - 36.0 g/dL Final   RDW 09/09/2021 17.8 (H)  11.5 - 15.5 % Final   Platelets 09/09/2021 442 (H)  150 - 400 K/uL Final   nRBC 09/09/2021 0.0  0.0 - 0.2 % Final   Neutrophils Relative % 09/09/2021 70  % Final   Neutro Abs 09/09/2021 5.9  1.7 - 7.7 K/uL Final   Lymphocytes Relative 09/09/2021 20  % Final   Lymphs Abs 09/09/2021 1.7  0.7 - 4.0 K/uL Final   Monocytes Relative 09/09/2021 8  % Final   Monocytes Absolute 09/09/2021 0.7  0.1 - 1.0 K/uL Final   Eosinophils Relative 09/09/2021 1  % Final   Eosinophils Absolute 09/09/2021 0.1  0.0 - 0.5 K/uL Final   Basophils Relative 09/09/2021 1  % Final   Basophils Absolute 09/09/2021 0.1  0.0 - 0.1 K/uL Final   Immature Granulocytes 09/09/2021 0  % Final   Abs Immature Granulocytes 09/09/2021 0.03  0.00 - 0.07 K/uL Final   Performed at [facility], [place] [address], [place], [place] [address]   Ammonia 09/09/2021 37 (H)  9 - 35 umol/L Final   Comment: HEMOLYSIS AT THIS LEVEL MAY AFFECT RESULT Performed at [facility], [place] [address] [place],  [place] [address]    Opiates 09/09/2021 NONE DETECTED  NONE DETECTED Final   Cocaine 09/09/2021 NONE DETECTED  NONE DETECTED Final   Benzodiazepines 09/09/2021 NONE DETECTED  NONE DETECTED Final   Amphetamines 09/09/2021 NONE DETECTED  NONE DETECTED Final   Tetrahydrocannabinol 09/09/2021 NONE DETECTED  NONE DETECTED Final   Barbiturates 09/09/2021 NONE DETECTED  NONE DETECTED Final   Comment: (NOTE) DRUG SCREEN FOR MEDICAL PURPOSES ONLY.  IF CONFIRMATION IS NEEDED FOR ANY PURPOSE, NOTIFY LAB WITHIN 5 DAYS.  LOWEST DETECTABLE  LIMITS FOR URINE DRUG SCREEN Drug Class                     Cutoff (ng/mL) Amphetamine and metabolites    1000 Barbiturate and metabolites    200 Benzodiazepine  841 Tricyclics and metabolites     300 Opiates and metabolites        300 Cocaine and metabolites        300 THC                            50 Performed at [facility], [place] [address], [place], [place] [address]    Alcohol, Ethyl (B) 09/09/2021 <10  <10 mg/dL Final   Comment: (NOTE) Lowest detectable limit for serum alcohol is 10 mg/dL.  For medical purposes only. Performed at [facility], [place] [address], [place], [place] [address]    Lipase 09/09/2021 33  11 - 51 U/L Final   Performed at [facility] [address], [place], [place] [address]   Color, Urine 09/09/2021 COLORLESS (A)  YELLOW Final   APPearance 09/09/2021 CLEAR  CLEAR Final   Specific Gravity, Urine 09/09/2021 1.002 (L)  1.005 - 1.030 Final   pH 09/09/2021 6.0  5.0 - 8.0 Final   Glucose, UA 09/09/2021 NEGATIVE  NEGATIVE mg/dL Final   Hgb urine dipstick 09/09/2021 NEGATIVE  NEGATIVE Final   Bilirubin Urine 09/09/2021 NEGATIVE  NEGATIVE Final   Ketones, ur 09/09/2021 NEGATIVE  NEGATIVE mg/dL Final   Protein, ur 09/09/2021 NEGATIVE  NEGATIVE mg/dL Final   Nitrite 09/09/2021 NEGATIVE  NEGATIVE Final   Leukocytes,Ua 09/09/2021 NEGATIVE  NEGATIVE Final   Performed at [facility] [address] [place], [place] [address]   Specimen Description 09/09/2021 URINE, CLEAN CATCH   Final   Special Requests 09/09/2021 NONE   Final   Culture 09/09/2021  (A)   Final                   Value:<10,000 COLONIES/mL INSIGNIFICANT GROWTH Performed at [facility], [place] [address], [place], [place] [address]    Report Status 09/09/2021 09/10/2021 FINAL   Final   D-Dimer, Quant 09/09/2021 0.92 (H)  0.00 - 0.50 ug/mL-FEU Final   Comment: (NOTE) At the manufacturer cut-off value of 0.5 g/mL FEU, this assay has a negative  predictive value of 95-100%.This assay is intended for use in conjunction with a clinical pretest probability (PTP) assessment model to exclude pulmonary embolism (PE) and deep venous thrombosis (DVT) in outpatients suspected of PE or DVT. Results should be correlated with clinical presentation. Performed at [facility], [place] [address], [place], [place] [address]    Troponin I (High Sensitivity) 09/09/2021 5  <18 ng/L Final   Comment: (NOTE) Elevated high sensitivity troponin I (hsTnI) values and significant  changes across serial measurements may suggest ACS but many other  chronic and acute conditions are known to elevate hsTnI results.  Refer to the "Links" section for chest pain algorithms and additional  guidance. Performed at [facility], [place] [address], [place], [place] [address]    BP 09/10/2021 135/83  mmHg Final   S' Lateral 09/10/2021 2.30  cm Final   Area-P 1/2 09/10/2021 5.58  cm2 Final    Blood Alcohol level:  Lab Results  Component Value Date   ETH <10 11/23/2021   ETH <10 95/18/8416    Metabolic Disorder Labs: Lab Results  Component Value Date   HGBA1C 7.0 (H) 10/04/2021   MPG 154.2 10/04/2021   Lab Results  Component Value Date   PROLACTIN 3.8 (L) 11/23/2021   Lab Results  Component Value Date   CHOL  171 11/23/2021   TRIG 125 11/23/2021   HDL 65 11/23/2021   CHOLHDL 2.6 11/23/2021   VLDL 25 11/23/2021   LDLCALC 81 11/23/2021   LDLCALC 95 10/04/2021    Therapeutic Lab Levels: No results found for: "LITHIUM" Lab Results  Component Value Date   VALPROATE 54 12/03/2021   VALPROATE 45 (L) 11/09/2021   No results found for: "CBMZ"  Physical Findings   PHQ2-9    [facility] ED from 11/23/2021 in [facility]  PHQ-2 Total Score 2  PHQ-9 Total Score 3      Flowsheet Row ED from 11/23/2021 in [facility] Most recent reading at 11/23/2021  6:44 PM ED from 11/22/2021 in  [facility] Most recent reading at 11/22/2021  8:28 PM ED from 11/22/2021 in [place] [facility] Most recent reading at 11/22/2021  1:24 AM  C-SSRS RISK CATEGORY No Risk No Risk No Risk        Musculoskeletal  Strength & Muscle Tone: within normal limits Gait & Station: normal Patient leans: N/A  Psychiatric Specialty Exam  Presentation  General Appearance:  Appropriate for Environment; Casual; Fairly Groomed  Eye Contact: Good  Speech: Clear and Coherent; Normal Rate  Speech Volume: Normal  Handedness: Left  Mood and Affect  Mood: Euthymic  Affect: Appropriate; Congruent; Full Range   Thought Process  Thought Processes: Coherent; Goal Directed; Linear  Descriptions of Associations:Intact  Orientation:Full (Time, Place and Person)  Thought Content:Logical; WDL  Diagnosis of Schizophrenia or Schizoaffective disorder in past: Yes  Hallucinations:Hallucinations: None    Ideas of Reference:None  Suicidal Thoughts:Suicidal Thoughts: No (Contracted to safety)    Homicidal Thoughts:Homicidal Thoughts: No    Sensorium  Memory: Immediate Good  Judgment: Fair  Insight: Fair   Executive Functions  Concentration: Good  Attention Span: Good  Recall: Good  Fund of Knowledge: Good  Language: Good   Psychomotor Activity  Psychomotor Activity: Psychomotor Activity: Normal     Assets  Assets: Communication Skills; Desire for Improvement; Leisure Time   Sleep  Sleep: Sleep: Good    Physical Exam  Physical Exam Vitals and nursing note reviewed.  Constitutional:      General: She is not in acute distress.    Appearance: She is not ill-appearing or diaphoretic.  HENT:     Head: Normocephalic and atraumatic.  Pulmonary:     Effort: Pulmonary effort is normal. No respiratory distress.  Neurological:     General: No focal deficit present.     Mental Status: She is  alert.    Blood pressure 112/60, pulse 79, temperature 98 F (36.7 C), temperature source Oral, resp. rate 18, SpO2 100 %. There is no height or weight on file to calculate BMI.  Treatment Plan Summary: Shian remains cleared by psychiatry.   Facility [facility] team along with [facility] health care manager and case manager and Adult [employer] representative continue to seek placement for [place].  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 apixaban, 5 mg, BID clozapine, 50 mg, Daily cloZAPine, 75 mg, QHS diltiazem, 240 mg, Daily divalproex, 500 mg, BID fluticasone furoate-vilanterol, 1 puff, Daily insulin aspart, 0-9 Units, TID WC insulin glargine-yfgn, 10 Units, Daily latanoprost, 1 drop, QHS menthol-cetylpyridinium, 1 lozenge, Once metFORMIN, 1,000 mg, BID WC nicotine, 7 mg, Daily Vitamin D (Ergocalciferol), 50,000 Units, Q7 days    [name], DO 12/18/2021 10:47 AM
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
## 2021-12-19 DIAGNOSIS — F200 Paranoid schizophrenia: Secondary | ICD-10-CM | POA: Diagnosis not present

## 2021-12-19 LAB — GLUCOSE, CAPILLARY
Glucose-Capillary: 162 mg/dL — ABNORMAL HIGH (ref 70–99)
Glucose-Capillary: 126 mg/dL — ABNORMAL HIGH (ref 70–99)
Glucose-Capillary: 204 mg/dL — ABNORMAL HIGH (ref 70–99)

## 2021-12-19 NOTE — ED Provider Notes (Signed)
Behavioral Health Progress Note  Date and Time: 12/19/2021 12:25 PM Name: Teresa Murillo MRN:  [id number]  Teresa Murillo is a 61 y.o. female with a past psychiatric history of schizophrenia, aggressive behavior, and possible intellectual disability presenting to [facility] on 11/23/21 voluntarily as a walk in via [facility] with complaints that she was locked out of the group home. Patient has been dismissed from group home due to multiple elopements. Pt had already been discharged from this facility but had been living there temporarily while DSS was looking for new placement. Pt is boarding at [facility].   Subjective: Patient seen and evaluated face-to-face by this provider, and chart reviewed. On evaluation, patient is alert and oriented x 3. Her thought process is logical and speech is clear and coherent. Her mood is euthymic and affect is congruent. She denies suicidal ideations. She denies homicidal ideations. She denies auditory or visual hallucinations. There is no objective evidence that the patient is currently responding to internal or external stimuli. She reports fair sleep. She reports a good appetite. Se denies depressive symptoms. She is compliant with taking scheduled medications and denies medication side effects, no muscle spasms, involuntary movements, headaches, nausea, or chest pain. She states that she would like to go to the clothes pin to get some more shirts. She states Teresa Murillo at [facility] told her that she has her check and is willing to turn it over. I discussed with the patient the our social work team is currently working with DSS to find placement and as of today I do not have any new updates. Patient verbalizes understanding.   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 On the Wechsler Adult Intelligence Scale-4, Ms. Weinkauf achieved a full-scale IQ score of 73 and a percentile rank of 4 placing her in the borderline range of intellectual functioning.    Chronic obstructive pulmonary disease (COPD) (HCC)    Glaucoma    Hyperlipidemia    Hypertension    Iron deficiency    Schizoaffective disorder (HCC)    Type 2 diabetes mellitus (HCC)     Past Surgical History:  Procedure Laterality Date   TUBAL LIGATION     Family History:  Family History  Problem Relation Age of Onset   Breast cancer Maternal Grandmother    Family Psychiatric  History: No history reported.  Social History:  Social History   Substance and Sexual Activity  Alcohol Use Yes     Social History   Substance and Sexual Activity  Drug Use Not Currently    Social History   Socioeconomic History   Marital status: Divorced    Spouse name: Not on file   Number of children: Not on file   Years of education: Not on file   Highest education level: Not on file  Occupational History   Not on file  Tobacco Use   Smoking status: Every Day    Packs/day: 1.00    Types: Cigars, Cigarettes   Smokeless tobacco: Current  Vaping Use   Vaping Use: Never used  Substance and Sexual Activity   Alcohol use: Yes   Drug use:  Not Currently   Sexual activity: Not Currently    Birth control/protection: Surgical  Other Topics Concern   Not on file  Social History Narrative   Not on file   Social Determinants of Health   Financial Resource Strain: Not on file  Food Insecurity: Not on file  Transportation Needs: Not on file  Physical Activity: Not on file  Stress: Not on file  Social Connections: Not on file   SDOH:  SDOH Screenings   Depression (PHQ2-9):  Low Risk  (11/23/2021)  Tobacco Use: High Risk (12/14/2021)   Additional Social History:    Pain Medications: See MAR Prescriptions: See MAR Over the Counter: See MAR History of alcohol / drug use?: No history of alcohol / drug abuse Longest period of sobriety (when/how long): N/A    Current Medications:  Current Facility-Administered Medications  Medication Dose Route Frequency Provider Last Rate Last Admin   acetaminophen (TYLENOL) tablet 650 mg  650 mg Oral Q6H PRN Rankin, Shuvon B, NP   650 mg at 12/16/21 2156   albuterol (VENTOLIN HFA) 108 (90 Base) MCG/ACT inhaler 2 puff  2 puff Inhalation Q6H PRN Rankin, Shuvon B, NP   2 puff at 12/09/21 2102   alum & mag hydroxide-simeth (MAALOX/MYLANTA) 200-200-20 MG/5ML suspension 30 mL  30 mL Oral Q4H PRN Rankin, Shuvon B, NP       apixaban (ELIQUIS) tablet 5 mg  5 mg Oral BID Rankin, Shuvon B, NP   5 mg at 12/19/21 1015   cloZAPine (CLOZARIL) tablet 50 mg  50 mg Oral Daily [name], NP   50 mg at 12/19/21 1015   cloZAPine (CLOZARIL) tablet 75 mg  75 mg Oral QHS [name], NP   75 mg at 12/18/21 2116   diltiazem (CARDIZEM CD) 24 hr capsule 240 mg  240 mg Oral Daily Rankin, Shuvon B, NP   240 mg at 12/19/21 1016   divalproex (DEPAKOTE ER) 24 hr tablet 500 mg  500 mg Oral BID Rankin, Shuvon B, NP   500 mg at 12/19/21 1015   fluticasone furoate-vilanterol (BREO ELLIPTA) 200-25 MCG/ACT 1 puff  1 puff Inhalation Daily Rankin, Shuvon B, NP   1 puff at 12/19/21 0837   haloperidol (HALDOL) tablet 10 mg  10 mg Oral Q8H PRN Rankin, Shuvon B, NP       insulin aspart (novoLOG) injection 0-9 Units  0-9 Units Subcutaneous TID WC Tharon Aquas, NP   2 Units at 12/19/21 1214   insulin glargine-yfgn (SEMGLEE) injection 10 Units  10 Units Subcutaneous Daily [name], DO   10 Units at 12/19/21 1016   latanoprost (XALATAN) 0.005 % ophthalmic solution 1 drop  1 drop Both Eyes QHS Rankin, Shuvon B, NP   1 drop at 12/18/21 2116   magnesium  hydroxide (MILK OF MAGNESIA) suspension 30 mL  30 mL Oral Daily PRN Rankin, Shuvon B, NP       menthol-cetylpyridinium (CEPACOL) lozenge 3 mg  1 lozenge Oral Once Onuoha, Chinwendu V, NP       metFORMIN (GLUCOPHAGE-XR) 24 hr tablet 1,000 mg  1,000 mg Oral BID WC [name], DO   1,000 mg at 12/19/21 0830   nicotine (NICODERM CQ - dosed in mg/24 hr) patch 7 mg  7 mg Transdermal Daily Rankin, Shuvon B, NP   7 mg at 12/19/21 1015   Vitamin D (Ergocalciferol) (DRISDOL) 1.25 MG (50000 UNIT) capsule 50,000 Units  50,000 Units Oral Q7 days Rankin, Shuvon B, NP   50,000  Units at 12/17/21 0930   Current Outpatient Medications  Medication Sig Dispense Refill   Accu-Chek Softclix Lancets lancets Use as directed up to four times daily 100 each 0   apixaban (ELIQUIS) 5 MG TABS tablet Take 1 tablet (5 mg total) by mouth 2 (two) times daily. 60 tablet 0   ARIPiprazole (ABILIFY) 10 MG tablet Take 1 tablet (10 mg total) by mouth daily. 30 tablet 0   Blood Glucose Monitoring Suppl (ACCU-CHEK GUIDE) w/Device KIT Use as directed up to four times daily 1 kit 0   budesonide-formoterol (SYMBICORT) 160-4.5 MCG/ACT inhaler Inhale 2 puffs into the lungs in the morning and at bedtime.     Cholecalciferol (VITAMIN D3) 1.25 MG (50000 UT) CAPS Take 50,000 Units by mouth every Thursday.     cloZAPine (CLOZARIL) 25 MG tablet Take 3 tablets (75 mg total) by mouth at bedtime. 90 tablet 0   clozapine (CLOZARIL) 50 MG tablet Take 1 tablet (50 mg total) by mouth daily. 30 tablet 0   diltiazem (CARDIZEM CD) 240 MG 24 hr capsule Take 1 capsule (240 mg total) by mouth daily. (Patient not taking: Reported on 11/24/2021) 30 capsule 0   divalproex (DEPAKOTE ER) 500 MG 24 hr tablet Take 1 tablet (500 mg total) by mouth 2 (two) times daily. 60 tablet 0   glucose blood test strip Use as directed up to four times daily 50 each 0   haloperidol (HALDOL) 10 MG tablet Take 1 tablet (10 mg total) by mouth 3 (three) times daily as needed for  agitation (and psychotic symptoms).     INGREZZA 40 MG capsule Take 1 capsule (40 mg total) by mouth in the morning. 30 capsule 0   insulin aspart (NOVOLOG) 100 UNIT/ML FlexPen Before each meal 3 times a day, 140-199 - 2 units, 200-250 - 4 units, 251-299 - 6 units,  300-349 - 8 units,  350 or above 10 units. Insulin PEN if approved, provide syringes and needles if needed.Please switch to any approved short acting Insulin if needed. 15 mL 0   insulin glargine (LANTUS) 100 UNIT/ML Solostar Pen Inject 12 Units into the skin daily. 15 mL 0   Insulin Pen Needle 32G X 4 MM MISC Use 4 times a day with insulin, 1 month supply. 100 each 0   latanoprost (XALATAN) 0.005 % ophthalmic solution Place 1 drop into both eyes at bedtime.     metFORMIN (GLUCOPHAGE) 500 MG tablet Take 500 mg by mouth 2 (two) times daily with a meal.     nicotine (NICODERM CQ - DOSED IN MG/24 HR) 7 mg/24hr patch Place 1 patch (7 mg total) onto the skin daily. 28 patch 0   PROAIR HFA 108 (90 Base) MCG/ACT inhaler Inhale 2 puffs into the lungs every 6 (six) hours as needed for wheezing or shortness of breath.      Labs  Lab Results:  No results displayed because visit has over 200 results.    Admission on 11/22/2021, Discharged on 11/22/2021  Component Date Value Ref Range Status   Glucose-Capillary 11/22/2021 169 (H)  70 - 99 mg/dL Final   Glucose reference range applies only to samples taken after fasting for at least 8 hours.  No results displayed because visit has over 200 results.    Admission on 10/04/2021, Discharged on 10/22/2021  Component Date Value Ref Range Status   SARS Coronavirus 2 by RT PCR 10/04/2021 NEGATIVE  NEGATIVE Final   Comment: (NOTE) SARS-CoV-2 target nucleic acids are NOT DETECTED.  The SARS-CoV-2 RNA is generally detectable in upper respiratory specimens during the acute phase of infection. The lowest concentration of SARS-CoV-2 viral copies this assay can detect is 138 copies/mL. A negative  result does not preclude SARS-Cov-2 infection and should not be used as the sole basis for treatment or other patient management decisions. A negative result may occur with  improper specimen collection/handling, submission of specimen other than nasopharyngeal swab, presence of viral mutation(s) within the areas targeted by this assay, and inadequate number of viral copies(<138 copies/mL). A negative result must be combined with clinical observations, patient history, and epidemiological information. The expected result is Negative.  Fact Sheet for Patients:  EntrepreneurPulse.com.au  Fact Sheet for Healthcare Providers:  IncredibleEmployment.be  This test is no                          t yet approved or cleared by the [place] FDA and  has been authorized for detection and/or diagnosis of SARS-CoV-2 by FDA under an Emergency Use Authorization (EUA). This EUA will remain  in effect (meaning this test can be used) for the duration of the COVID-19 declaration under Section 564(b)(1) of the Act, 21 U.S.C.section 360bbb-3(b)(1), unless the authorization is terminated  or revoked sooner.       Influenza A by PCR 10/04/2021 NEGATIVE  NEGATIVE Final   Influenza B by PCR 10/04/2021 NEGATIVE  NEGATIVE Final   Comment: (NOTE) The Xpert Xpress SARS-CoV-2/FLU/RSV plus assay is intended as an aid in the diagnosis of influenza from Nasopharyngeal swab specimens and should not be used as a sole basis for treatment. Nasal washings and aspirates are unacceptable for Xpert Xpress SARS-CoV-2/FLU/RSV testing.  Fact Sheet for Patients: EntrepreneurPulse.com.au  Fact Sheet for Healthcare Providers: IncredibleEmployment.be  This test is not yet approved or cleared by the [place] FDA and has been authorized for detection and/or diagnosis of SARS-CoV-2 by FDA under an Emergency Use Authorization (EUA). This EUA will  remain in effect (meaning this test can be used) for the duration of the COVID-19 declaration under Section 564(b)(1) of the Act, 21 U.S.C. section 360bbb-3(b)(1), unless the authorization is terminated or revoked.  Performed at [facility], [place] [address] [place], [place] [address]    WBC 10/04/2021 8.4  4.0 - 10.5 K/uL Final   RBC 10/04/2021 4.42  3.87 - 5.11 MIL/uL Final   Hemoglobin 10/04/2021 11.6 (L)  12.0 - 15.0 g/dL Final   HCT 10/04/2021 35.7 (L)  36.0 - 46.0 % Final   MCV 10/04/2021 80.8  80.0 - 100.0 fL Final   MCH 10/04/2021 26.2  26.0 - 34.0 pg Final   MCHC 10/04/2021 32.5  30.0 - 36.0 g/dL Final   RDW 10/04/2021 16.0 (H)  11.5 - 15.5 % Final   Platelets 10/04/2021 372  150 - 400 K/uL Final   nRBC 10/04/2021 0.0  0.0 - 0.2 % Final   Neutrophils Relative % 10/04/2021 68  % Final   Neutro Abs 10/04/2021 5.7  1.7 - 7.7 K/uL Final   Lymphocytes Relative 10/04/2021 22  % Final   Lymphs Abs 10/04/2021 1.8  0.7 - 4.0 K/uL Final   Monocytes Relative 10/04/2021 8  % Final   Monocytes Absolute 10/04/2021 0.7  0.1 - 1.0 K/uL Final   Eosinophils Relative 10/04/2021 1  % Final   Eosinophils Absolute 10/04/2021 0.1  0.0 - 0.5 K/uL Final   Basophils Relative 10/04/2021 1  % Final   Basophils  Absolute 10/04/2021 0.1  0.0 - 0.1 K/uL Final   Immature Granulocytes 10/04/2021 0  % Final   Abs Immature Granulocytes 10/04/2021 0.03  0.00 - 0.07 K/uL Final   Performed at [facility] [address], [place], [place] [address]   Sodium 10/04/2021 136  135 - 145 mmol/L Final   Potassium 10/04/2021 4.2  3.5 - 5.1 mmol/L Final   Chloride 10/04/2021 104  98 - 111 mmol/L Final   CO2 10/04/2021 25  22 - 32 mmol/L Final   Glucose, Bld 10/04/2021 100 (H)  70 - 99 mg/dL Final   Glucose reference range applies only to samples taken after fasting for at least 8 hours.   BUN 10/04/2021 9  6 - 20 mg/dL Final   Creatinine, Ser 10/04/2021 0.52  0.44 - 1.00 mg/dL Final   Calcium  10/04/2021 9.0  8.9 - 10.3 mg/dL Final   Total Protein 10/04/2021 7.0  6.5 - 8.1 g/dL Final   Albumin 10/04/2021 3.2 (L)  3.5 - 5.0 g/dL Final   AST 10/04/2021 13 (L)  15 - 41 U/L Final   ALT 10/04/2021 10  0 - 44 U/L Final   Alkaline Phosphatase 10/04/2021 61  38 - 126 U/L Final   Total Bilirubin 10/04/2021 0.3  0.3 - 1.2 mg/dL Final   GFR, Estimated 10/04/2021 >60  >60 mL/min Final   Comment: (NOTE) Calculated using the CKD-EPI Creatinine Equation (2021)    Anion gap 10/04/2021 7  5 - 15 Final   Performed at [facility] [address] [place], [place] [address]   Hgb A1c MFr Bld 10/04/2021 7.0 (H)  4.8 - 5.6 % Final   Comment: (NOTE) Pre diabetes:          5.7%-6.4%  Diabetes:              >6.4%  Glycemic control for   <7.0% adults with diabetes    Mean Plasma Glucose 10/04/2021 154.2  mg/dL Final   Performed at [facility], [place] [address] [place], [place] [address]   Cholesterol 10/04/2021 178  0 - 200 mg/dL Final   Triglycerides 10/04/2021 155 (H)  <150 mg/dL Final   HDL 10/04/2021 52  >40 mg/dL Final   Total CHOL/HDL Ratio 10/04/2021 3.4  RATIO Final   VLDL 10/04/2021 31  0 - 40 mg/dL Final   LDL Cholesterol 10/04/2021 95  0 - 99 mg/dL Final   Comment:        Total Cholesterol/HDL:CHD Risk Coronary Heart Disease Risk Table                     Men   Women  1/2 Average Risk   3.4   3.3  Average Risk       5.0   4.4  2 X Average Risk   9.6   7.1  3 X Average Risk  23.4   11.0        Use the calculated Patient Ratio above and the CHD Risk Table to determine the patient's CHD Risk.        ATP III CLASSIFICATION (LDL):  <100     mg/dL   Optimal  100-129  mg/dL   Near or Above                    Optimal  130-159  mg/dL   Borderline  160-189  mg/dL   High  >190  mg/dL   Very High Performed at [facility], [place] [address] [place], [place] [address]    POC Amphetamine UR 10/04/2021 None Detected  NONE DETECTED (Cut Off Level 1000 ng/mL) Final    POC Secobarbital (BAR) 10/04/2021 None Detected  NONE DETECTED (Cut Off Level 300 ng/mL) Final   POC Buprenorphine (BUP) 10/04/2021 None Detected  NONE DETECTED (Cut Off Level 10 ng/mL) Final   POC Oxazepam (BZO) 10/04/2021 None Detected  NONE DETECTED (Cut Off Level 300 ng/mL) Final   POC Cocaine UR 10/04/2021 None Detected  NONE DETECTED (Cut Off Level 300 ng/mL) Final   POC Methamphetamine UR 10/04/2021 None Detected  NONE DETECTED (Cut Off Level 1000 ng/mL) Final   POC Morphine 10/04/2021 None Detected  NONE DETECTED (Cut Off Level 300 ng/mL) Final   POC Methadone UR 10/04/2021 None Detected  NONE DETECTED (Cut Off Level 300 ng/mL) Final   POC Oxycodone UR 10/04/2021 Positive (A)  NONE DETECTED (Cut Off Level 100 ng/mL) Final   POC Marijuana UR 10/04/2021 None Detected  NONE DETECTED (Cut Off Level 50 ng/mL) Final   SARSCOV2ONAVIRUS 2 AG 10/04/2021 NEGATIVE  NEGATIVE Final   Comment: (NOTE) SARS-CoV-2 antigen NOT DETECTED.   Negative results are presumptive.  Negative results do not preclude SARS-CoV-2 infection and should not be used as the sole basis for treatment or other patient management decisions, including infection  control decisions, particularly in the presence of clinical signs and  symptoms consistent with COVID-19, or in those who have been in contact with the virus.  Negative results must be combined with clinical observations, patient history, and epidemiological information. The expected result is Negative.  Fact Sheet for Patients: HandmadeRecipes.com.cy  Fact Sheet for Healthcare Providers: FuneralLife.at  This test is not yet approved or cleared by the [place] FDA and  has been authorized for detection and/or diagnosis of SARS-CoV-2 by FDA under an Emergency Use Authorization (EUA).  This EUA will remain in effect (meaning this test can be used) for the duration of  the COV                          ID-19  declaration under Section 564(b)(1) of the Act, 21 U.S.C. section 360bbb-3(b)(1), unless the authorization is terminated or revoked sooner.     Valproic Acid Lvl 10/04/2021 51  50.0 - 100.0 ug/mL Final   Performed at [facility] [address] [place], [place] [address]   Valproic Acid Lvl 10/08/2021 57  50.0 - 100.0 ug/mL Final   Performed at [facility] [address], [place], [place] [address]   Glucose-Capillary 10/09/2021 123 (H)  70 - 99 mg/dL Final   Glucose reference range applies only to samples taken after fasting for at least 8 hours.  Admission on 09/09/2021, Discharged on 09/10/2021  Component Date Value Ref Range Status   Sodium 09/09/2021 134 (L)  135 - 145 mmol/L Final   Potassium 09/09/2021 4.3  3.5 - 5.1 mmol/L Final   Chloride 09/09/2021 99  98 - 111 mmol/L Final   CO2 09/09/2021 25  22 - 32 mmol/L Final   Glucose, Bld 09/09/2021 107 (H)  70 - 99 mg/dL Final   Glucose reference range applies only to samples taken after fasting for at least 8 hours.   BUN 09/09/2021 12  6 - 20 mg/dL Final   Creatinine, Ser 09/09/2021 0.74  0.44 - 1.00 mg/dL Final   Calcium 09/09/2021  9.0  8.9 - 10.3 mg/dL Final   Total Protein 09/09/2021 7.4  6.5 - 8.1 g/dL Final   Albumin 09/09/2021 3.1 (L)  3.5 - 5.0 g/dL Final   AST 09/09/2021 13 (L)  15 - 41 U/L Final   ALT 09/09/2021 13  0 - 44 U/L Final   Alkaline Phosphatase 09/09/2021 66  38 - 126 U/L Final   Total Bilirubin 09/09/2021 0.2 (L)  0.3 - 1.2 mg/dL Final   GFR, Estimated 09/09/2021 >60  >60 mL/min Final   Comment: (NOTE) Calculated using the CKD-EPI Creatinine Equation (2021)    Anion gap 09/09/2021 10  5 - 15 Final   Performed at [facility], [place] [address] [place], [place] [address]   WBC 09/09/2021 8.5  4.0 - 10.5 K/uL Final   RBC 09/09/2021 4.53  3.87 - 5.11 MIL/uL Final   Hemoglobin 09/09/2021 11.8 (L)  12.0 - 15.0 g/dL Final   HCT 09/09/2021 38.0  36.0 - 46.0 % Final   MCV 09/09/2021 83.9   80.0 - 100.0 fL Final   MCH 09/09/2021 26.0  26.0 - 34.0 pg Final   MCHC 09/09/2021 31.1  30.0 - 36.0 g/dL Final   RDW 09/09/2021 17.8 (H)  11.5 - 15.5 % Final   Platelets 09/09/2021 442 (H)  150 - 400 K/uL Final   nRBC 09/09/2021 0.0  0.0 - 0.2 % Final   Neutrophils Relative % 09/09/2021 70  % Final   Neutro Abs 09/09/2021 5.9  1.7 - 7.7 K/uL Final   Lymphocytes Relative 09/09/2021 20  % Final   Lymphs Abs 09/09/2021 1.7  0.7 - 4.0 K/uL Final   Monocytes Relative 09/09/2021 8  % Final   Monocytes Absolute 09/09/2021 0.7  0.1 - 1.0 K/uL Final   Eosinophils Relative 09/09/2021 1  % Final   Eosinophils Absolute 09/09/2021 0.1  0.0 - 0.5 K/uL Final   Basophils Relative 09/09/2021 1  % Final   Basophils Absolute 09/09/2021 0.1  0.0 - 0.1 K/uL Final   Immature Granulocytes 09/09/2021 0  % Final   Abs Immature Granulocytes 09/09/2021 0.03  0.00 - 0.07 K/uL Final   Performed at [facility], [place] [address], [place], [place] [address]   Ammonia 09/09/2021 37 (H)  9 - 35 umol/L Final   Comment: HEMOLYSIS AT THIS LEVEL MAY AFFECT RESULT Performed at [facility], [place] [address], [place], [place] [address]    Opiates 09/09/2021 NONE DETECTED  NONE DETECTED Final   Cocaine 09/09/2021 NONE DETECTED  NONE DETECTED Final   Benzodiazepines 09/09/2021 NONE DETECTED  NONE DETECTED Final   Amphetamines 09/09/2021 NONE DETECTED  NONE DETECTED Final   Tetrahydrocannabinol 09/09/2021 NONE DETECTED  NONE DETECTED Final   Barbiturates 09/09/2021 NONE DETECTED  NONE DETECTED Final   Comment: (NOTE) DRUG SCREEN FOR MEDICAL PURPOSES ONLY.  IF CONFIRMATION IS NEEDED FOR ANY PURPOSE, NOTIFY LAB WITHIN 5 DAYS.  LOWEST DETECTABLE LIMITS FOR URINE DRUG SCREEN Drug Class                     Cutoff (ng/mL) Amphetamine and metabolites    1000 Barbiturate and metabolites    200 Benzodiazepine                 800 Tricyclics and metabolites     300 Opiates and metabolites        300 Cocaine and  metabolites        300 THC  50 Performed at [facility], [place] [address] [place], [place] [address]    Alcohol, Ethyl (B) 09/09/2021 <10  <10 mg/dL Final   Comment: (NOTE) Lowest detectable limit for serum alcohol is 10 mg/dL.  For medical purposes only. Performed at [facility], [place] [address], [place], [place] [address]    Lipase 09/09/2021 33  11 - 51 U/L Final   Performed at [facility] [address] [place], [place] [address]   Color, Urine 09/09/2021 COLORLESS (A)  YELLOW Final   APPearance 09/09/2021 CLEAR  CLEAR Final   Specific Gravity, Urine 09/09/2021 1.002 (L)  1.005 - 1.030 Final   pH 09/09/2021 6.0  5.0 - 8.0 Final   Glucose, UA 09/09/2021 NEGATIVE  NEGATIVE mg/dL Final   Hgb urine dipstick 09/09/2021 NEGATIVE  NEGATIVE Final   Bilirubin Urine 09/09/2021 NEGATIVE  NEGATIVE Final   Ketones, ur 09/09/2021 NEGATIVE  NEGATIVE mg/dL Final   Protein, ur 09/09/2021 NEGATIVE  NEGATIVE mg/dL Final   Nitrite 09/09/2021 NEGATIVE  NEGATIVE Final   Leukocytes,Ua 09/09/2021 NEGATIVE  NEGATIVE Final   Performed at [facility] [address] [place], [place] [address]   Specimen Description 09/09/2021 URINE, CLEAN CATCH   Final   Special Requests 09/09/2021 NONE   Final   Culture 09/09/2021  (A)   Final                   Value:<10,000 COLONIES/mL INSIGNIFICANT GROWTH Performed at [facility], [place] [address], [place], [place] [address]    Report Status 09/09/2021 09/10/2021 FINAL   Final   D-Dimer, Quant 09/09/2021 0.92 (H)  0.00 - 0.50 ug/mL-FEU Final   Comment: (NOTE) At the manufacturer cut-off value of 0.5 g/mL FEU, this assay has a negative predictive value of 95-100%.This assay is intended for use in conjunction with a clinical pretest probability (PTP) assessment model to exclude pulmonary embolism (PE) and deep venous thrombosis (DVT) in outpatients suspected of PE or DVT. Results should be correlated  with clinical presentation. Performed at [facility], [place] [address] [place], [place] [address]    Troponin I (High Sensitivity) 09/09/2021 5  <18 ng/L Final   Comment: (NOTE) Elevated high sensitivity troponin I (hsTnI) values and significant  changes across serial measurements may suggest ACS but many other  chronic and acute conditions are known to elevate hsTnI results.  Refer to the "Links" section for chest pain algorithms and additional  guidance. Performed at [facility], [place] [address] [place], [place] [address]    BP 09/10/2021 135/83  mmHg Final   S' Lateral 09/10/2021 2.30  cm Final   Area-P 1/2 09/10/2021 5.58  cm2 Final    Blood Alcohol level:  Lab Results  Component Value Date   ETH <10 11/23/2021   ETH <10 33/82/5053    Metabolic Disorder Labs: Lab Results  Component Value Date   HGBA1C 7.0 (H) 10/04/2021   MPG 154.2 10/04/2021   Lab Results  Component Value Date   PROLACTIN 3.8 (L) 11/23/2021   Lab Results  Component Value Date   CHOL 171 11/23/2021   TRIG 125 11/23/2021   HDL 65 11/23/2021   CHOLHDL 2.6 11/23/2021   VLDL 25 11/23/2021   LDLCALC 81 11/23/2021   LDLCALC 95 10/04/2021    Therapeutic Lab Levels: No results found for: "LITHIUM" Lab Results  Component Value Date   VALPROATE 54 12/03/2021   VALPROATE 45 (L)  11/09/2021   No results found for: "CBMZ"  Physical Findings   PHQ2-9    [facility] from 11/23/2021 in [facility]  PHQ-2 Total Score 2  PHQ-9 Total Score 3      Flowsheet Row ED from 11/23/2021 in [facility] Most recent reading at 11/23/2021  6:44 PM ED from 11/22/2021 in [facility] Most recent reading at 11/22/2021  8:28 PM ED from 11/22/2021 in [place] [facility] Most recent reading at 11/22/2021  1:24 AM  C-SSRS RISK CATEGORY No Risk No Risk No Risk        Musculoskeletal   Strength & Muscle Tone: within normal limits Gait & Station: normal Patient leans: N/A  Psychiatric Specialty Exam  Presentation  General Appearance:  Appropriate for Environment  Eye Contact: Fair  Speech: Clear and Coherent  Speech Volume: Normal  Handedness: Left   Mood and Affect  Mood: Euthymic  Affect: Congruent   Thought Process  Thought Processes: Coherent  Descriptions of Associations:Intact  Orientation:Full (Time, Place and Person)  Thought Content:Logical  Diagnosis of Schizophrenia or Schizoaffective disorder in past: Yes  Duration of Psychotic Symptoms: No data recorded  Hallucinations:Hallucinations: None  Ideas of Reference:None  Suicidal Thoughts:Suicidal Thoughts: No  Homicidal Thoughts:Homicidal Thoughts: No   Sensorium  Memory: Immediate Fair; Recent Fair  Judgment: Intact  Insight: Present   Executive Functions  Concentration: Fair  Attention Span: Fair  Recall: [employer] of Knowledge: Fair  Language: Fair   Psychomotor Activity  Psychomotor Activity: Psychomotor Activity: Normal   Assets  Assets: Communication Skills; Desire for Improvement   Sleep  Sleep: Sleep: Fair Number of Hours of Sleep: 8   Physical Exam  Physical Exam HENT:     Head: Normocephalic.     Nose: Nose normal.  Eyes:     Conjunctiva/sclera: Conjunctivae normal.  Cardiovascular:     Rate and Rhythm: Normal rate.  Pulmonary:     Effort: Pulmonary effort is normal.  Musculoskeletal:        General: Normal range of motion.     Cervical back: Normal range of motion.  Neurological:     Mental Status: She is alert and oriented to person, place, and time.    Review of Systems  Constitutional: Negative.   HENT: Negative.    Eyes: Negative.   Respiratory: Negative.    Cardiovascular: Negative.   Gastrointestinal: Negative.   Genitourinary: Negative.   Neurological: Negative.   Endo/Heme/Allergies: Negative.     Blood pressure 117/81, pulse 68, temperature 97.9 F (36.6 C), temperature source Oral, resp. rate 18, SpO2 100 %. There is no height or weight on file to calculate BMI.  Treatment Plan Summary: Patient psychiatrically cleared for discharge. TOC team along with [facility] health care manager and case manager and Adult [employer] representative continues to seek placement for [place].  Continue current medication regimen. No medication changes made on 12/19/21   apixaban  5 mg Oral BID   clozapine  50 mg Oral Daily   cloZAPine  75 mg Oral QHS   diltiazem  240 mg Oral Daily   divalproex  500 mg Oral BID   fluticasone furoate-vilanterol  1 puff Inhalation Daily   insulin aspart  0-9 Units Subcutaneous TID WC   insulin glargine-yfgn  10 Units Subcutaneous Daily   latanoprost  1 drop Both Eyes QHS   menthol-cetylpyridinium  1 lozenge Oral Once   metFORMIN  1,000 mg  Oral BID WC   nicotine  7 mg Transdermal Daily   Vitamin D (Ergocalciferol)  50,000 Units Oral Q7 days     Marissa Calamity, NP 12/19/2021 12:25 PM

## 2021-12-19 NOTE — ED Notes (Signed)
Pt sleeping at present, no distress noted.  Monitoring for safety. 

## 2021-12-19 NOTE — ED Notes (Signed)
Pt given breakfast.

## 2021-12-19 NOTE — ED Notes (Signed)
Pt voices c/o gas pains and stomach issues. Medicated with PRN mylanta. Will continue to monitor for safety.

## 2021-12-19 NOTE — ED Notes (Signed)
Patient alert and oriented x4. Denies SI/HI/AVH. Diabetes education is ongoing and overall blood sugars are trending down, will continue to monitor.

## 2021-12-19 NOTE — ED Notes (Signed)
Pt awake, alert, oriented X 4. Watching tv with peer. Denies SI/HI. No c/o pain. No resp distress noted. Will continue to monitor for safety.

## 2021-12-20 DIAGNOSIS — F200 Paranoid schizophrenia: Secondary | ICD-10-CM | POA: Diagnosis not present

## 2021-12-20 LAB — GLUCOSE, CAPILLARY
Glucose-Capillary: 104 mg/dL — ABNORMAL HIGH (ref 70–99)
Glucose-Capillary: 160 mg/dL — ABNORMAL HIGH (ref 70–99)
Glucose-Capillary: 152 mg/dL — ABNORMAL HIGH (ref 70–99)
Glucose-Capillary: 170 mg/dL — ABNORMAL HIGH (ref 70–99)

## 2021-12-20 NOTE — ED Notes (Signed)
Pt resting quietly.  Breathing even and unlabored.   Staff will continue to monitor for safety.  

## 2021-12-20 NOTE — ED Notes (Signed)
Pt sleeping at present, no distress noted.  Monitoring for safety. 

## 2021-12-20 NOTE — ED Provider Notes (Signed)
Behavioral Health Progress Note  Date and Time: 12/20/2021 11:47 AM Name: Teresa Murillo MRN:  [id number]   Teresa Murillo is a 61 y.o. female with a past psychiatric history of schizophrenia, aggressive behavior, and possible intellectual disability presenting to [facility] on 11/23/21 voluntarily as a walk in via [facility] with complaints that she was locked out of the group home. Patient has been dismissed from group home due to multiple elopements. Pt had already been discharged from this facility but had been living there temporarily while DSS was looking for new placement. Pt is boarding at [facility].   Subjective: Patient seen and evaluated face-to-face by this provider, and chart reviewed. On evaluation, patient is alert and oriented x 3. Her thought process is linear and speech is clear and coherent. Her mood is euthymic and affect is congruent. She denies suicidal ideations. She denies homicidal ideations. She denies auditory or visual hallucinations. There is no objective evidence that the patient is currently responding to internal or external stimuli. She reports fair sleep. She reports a good appetite. Se denies depressive symptoms. She is compliant with taking scheduled medications and denies medication side effects, no muscle spasms, involuntary movements, headaches, nausea, or chest pain. She denies physical complaints. She states that she needs a place to stay. She states that [facility] told her that her check was deposited to [name]'s account and that Odette Horns is willing to write a check for her to receive her money. I informed the patient that I will leave a message for the social worker to follow up and speak with her on Monday to provide an update on placement and her benefits.  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 On the Wechsler Adult Intelligence Scale-4, [name] achieved a full-scale IQ score of 73 and a percentile rank of 4 placing her in the borderline range of intellectual functioning.    Chronic obstructive pulmonary disease (COPD) (HCC)    Glaucoma    Hyperlipidemia    Hypertension    Iron deficiency    Schizoaffective disorder (HCC)    Type 2 diabetes mellitus (HCC)     Past Surgical History:  Procedure Laterality Date   TUBAL LIGATION     Family History:  Family History  Problem Relation Age of Onset   Breast cancer Maternal Grandmother    Family Psychiatric  History: No history reported.  Social History:  Social History   Substance and Sexual Activity  Alcohol Use Yes     Social History   Substance and Sexual Activity  Drug Use Not Currently    Social History   Socioeconomic History   Marital status: Divorced    Spouse name: Not on file   Number of children: Not on file   Years of education: Not on file   Highest education level: Not on file  Occupational History   Not on file  Tobacco Use   Smoking status: Every Day    Packs/day: 1.00    Types: Cigars, Cigarettes   Smokeless tobacco: Current  Vaping Use   Vaping Use: Never used  Substance and Sexual Activity   Alcohol use: Yes  Drug use: Not Currently   Sexual activity: Not Currently    Birth control/protection: Surgical  Other Topics Concern   Not on file  Social History Narrative   Not on file   Social Determinants of Health   Financial Resource Strain: Not on file  Food Insecurity: Not on file  Transportation Needs: Not on file  Physical Activity: Not on file  Stress: Not on file  Social Connections: Not on file   SDOH:  SDOH  Screenings   Depression (PHQ2-9): Low Risk  (11/23/2021)  Tobacco Use: High Risk (12/14/2021)   Additional Social History:    Pain Medications: See MAR Prescriptions: See MAR Over the Counter: See MAR History of alcohol / drug use?: No history of alcohol / drug abuse Longest period of sobriety (when/how long): N/A    Current Medications:  Current Facility-Administered Medications  Medication Dose Route Frequency Provider Last Rate Last Admin   acetaminophen (TYLENOL) tablet 650 mg  650 mg Oral Q6H PRN Rankin, Shuvon B, NP   650 mg at 12/16/21 2156   albuterol (VENTOLIN HFA) 108 (90 Base) MCG/ACT inhaler 2 puff  2 puff Inhalation Q6H PRN Rankin, Shuvon B, NP   2 puff at 12/09/21 2102   alum & mag hydroxide-simeth (MAALOX/MYLANTA) 200-200-20 MG/5ML suspension 30 mL  30 mL Oral Q4H PRN Rankin, Shuvon B, NP   30 mL at 12/19/21 2109   apixaban (ELIQUIS) tablet 5 mg  5 mg Oral BID Rankin, Shuvon B, NP   5 mg at 12/20/21 0900   cloZAPine (CLOZARIL) tablet 50 mg  50 mg Oral Daily [name], NP   50 mg at 12/20/21 0900   cloZAPine (CLOZARIL) tablet 75 mg  75 mg Oral QHS [name], NP   75 mg at 12/19/21 2109   diltiazem (CARDIZEM CD) 24 hr capsule 240 mg  240 mg Oral Daily Rankin, Shuvon B, NP   240 mg at 12/20/21 0900   divalproex (DEPAKOTE ER) 24 hr tablet 500 mg  500 mg Oral BID Rankin, Shuvon B, NP   500 mg at 12/20/21 0900   fluticasone furoate-vilanterol (BREO ELLIPTA) 200-25 MCG/ACT 1 puff  1 puff Inhalation Daily Rankin, Shuvon B, NP   1 puff at 12/20/21 0858   haloperidol (HALDOL) tablet 10 mg  10 mg Oral Q8H PRN Rankin, Shuvon B, NP       insulin aspart (novoLOG) injection 0-9 Units  0-9 Units Subcutaneous TID WC Tharon Aquas, NP   1 Units at 12/19/21 1654   insulin glargine-yfgn (SEMGLEE) injection 10 Units  10 Units Subcutaneous Daily [name], DO   10 Units at 12/20/21 0900   latanoprost (XALATAN) 0.005 % ophthalmic solution 1 drop  1 drop Both Eyes QHS Rankin,  Shuvon B, NP   1 drop at 12/19/21 2113   magnesium hydroxide (MILK OF MAGNESIA) suspension 30 mL  30 mL Oral Daily PRN Rankin, Shuvon B, NP       menthol-cetylpyridinium (CEPACOL) lozenge 3 mg  1 lozenge Oral Once Onuoha, Chinwendu V, NP       metFORMIN (GLUCOPHAGE-XR) 24 hr tablet 1,000 mg  1,000 mg Oral BID WC [name], DO   1,000 mg at 12/20/21 0901   nicotine (NICODERM CQ - dosed in mg/24 hr) patch 7 mg  7 mg Transdermal Daily Rankin, Shuvon B, NP   7 mg at 12/20/21 0901   Vitamin D (Ergocalciferol) (DRISDOL) 1.25 MG (50000 UNIT) capsule 50,000 Units  50,000 Units Oral Q7 days Rankin, Shuvon  B, NP   50,000 Units at 12/17/21 0930   Current Outpatient Medications  Medication Sig Dispense Refill   Accu-Chek Softclix Lancets lancets Use as directed up to four times daily 100 each 0   apixaban (ELIQUIS) 5 MG TABS tablet Take 1 tablet (5 mg total) by mouth 2 (two) times daily. 60 tablet 0   ARIPiprazole (ABILIFY) 10 MG tablet Take 1 tablet (10 mg total) by mouth daily. 30 tablet 0   Blood Glucose Monitoring Suppl (ACCU-CHEK GUIDE) w/Device KIT Use as directed up to four times daily 1 kit 0   budesonide-formoterol (SYMBICORT) 160-4.5 MCG/ACT inhaler Inhale 2 puffs into the lungs in the morning and at bedtime.     Cholecalciferol (VITAMIN D3) 1.25 MG (50000 UT) CAPS Take 50,000 Units by mouth every Thursday.     cloZAPine (CLOZARIL) 25 MG tablet Take 3 tablets (75 mg total) by mouth at bedtime. 90 tablet 0   clozapine (CLOZARIL) 50 MG tablet Take 1 tablet (50 mg total) by mouth daily. 30 tablet 0   diltiazem (CARDIZEM CD) 240 MG 24 hr capsule Take 1 capsule (240 mg total) by mouth daily. (Patient not taking: Reported on 11/24/2021) 30 capsule 0   divalproex (DEPAKOTE ER) 500 MG 24 hr tablet Take 1 tablet (500 mg total) by mouth 2 (two) times daily. 60 tablet 0   glucose blood test strip Use as directed up to four times daily 50 each 0   haloperidol (HALDOL) 10 MG tablet Take 1 tablet (10 mg  total) by mouth 3 (three) times daily as needed for agitation (and psychotic symptoms).     INGREZZA 40 MG capsule Take 1 capsule (40 mg total) by mouth in the morning. 30 capsule 0   insulin aspart (NOVOLOG) 100 UNIT/ML FlexPen Before each meal 3 times a day, 140-199 - 2 units, 200-250 - 4 units, 251-299 - 6 units,  300-349 - 8 units,  350 or above 10 units. Insulin PEN if approved, provide syringes and needles if needed.Please switch to any approved short acting Insulin if needed. 15 mL 0   insulin glargine (LANTUS) 100 UNIT/ML Solostar Pen Inject 12 Units into the skin daily. 15 mL 0   Insulin Pen Needle 32G X 4 MM MISC Use 4 times a day with insulin, 1 month supply. 100 each 0   latanoprost (XALATAN) 0.005 % ophthalmic solution Place 1 drop into both eyes at bedtime.     metFORMIN (GLUCOPHAGE) 500 MG tablet Take 500 mg by mouth 2 (two) times daily with a meal.     nicotine (NICODERM CQ - DOSED IN MG/24 HR) 7 mg/24hr patch Place 1 patch (7 mg total) onto the skin daily. 28 patch 0   PROAIR HFA 108 (90 Base) MCG/ACT inhaler Inhale 2 puffs into the lungs every 6 (six) hours as needed for wheezing or shortness of breath.      Labs  Lab Results:  No results displayed because visit has over 200 results.    Admission on 11/22/2021, Discharged on 11/22/2021  Component Date Value Ref Range Status   Glucose-Capillary 11/22/2021 169 (H)  70 - 99 mg/dL Final   Glucose reference range applies only to samples taken after fasting for at least 8 hours.  No results displayed because visit has over 200 results.    Admission on 10/04/2021, Discharged on 10/22/2021  Component Date Value Ref Range Status   SARS Coronavirus 2 by RT PCR 10/04/2021 NEGATIVE  NEGATIVE Final   Comment: (NOTE) SARS-CoV-2 target  nucleic acids are NOT DETECTED.  The SARS-CoV-2 RNA is generally detectable in upper respiratory specimens during the acute phase of infection. The lowest concentration of SARS-CoV-2 viral copies this  assay can detect is 138 copies/mL. A negative result does not preclude SARS-Cov-2 infection and should not be used as the sole basis for treatment or other patient management decisions. A negative result may occur with  improper specimen collection/handling, submission of specimen other than nasopharyngeal swab, presence of viral mutation(s) within the areas targeted by this assay, and inadequate number of viral copies(<138 copies/mL). A negative result must be combined with clinical observations, patient history, and epidemiological information. The expected result is Negative.  Fact Sheet for Patients:  EntrepreneurPulse.com.au  Fact Sheet for Healthcare Providers:  IncredibleEmployment.be  This test is no                          t yet approved or cleared by the [place] FDA and  has been authorized for detection and/or diagnosis of SARS-CoV-2 by FDA under an Emergency Use Authorization (EUA). This EUA will remain  in effect (meaning this test can be used) for the duration of the COVID-19 declaration under Section 564(b)(1) of the Act, 21 U.S.C.section 360bbb-3(b)(1), unless the authorization is terminated  or revoked sooner.       Influenza A by PCR 10/04/2021 NEGATIVE  NEGATIVE Final   Influenza B by PCR 10/04/2021 NEGATIVE  NEGATIVE Final   Comment: (NOTE) The Xpert Xpress SARS-CoV-2/FLU/RSV plus assay is intended as an aid in the diagnosis of influenza from Nasopharyngeal swab specimens and should not be used as a sole basis for treatment. Nasal washings and aspirates are unacceptable for Xpert Xpress SARS-CoV-2/FLU/RSV testing.  Fact Sheet for Patients: EntrepreneurPulse.com.au  Fact Sheet for Healthcare Providers: IncredibleEmployment.be  This test is not yet approved or cleared by the [place] FDA and has been authorized for detection and/or diagnosis of SARS-CoV-2 by FDA under an  Emergency Use Authorization (EUA). This EUA will remain in effect (meaning this test can be used) for the duration of the COVID-19 declaration under Section 564(b)(1) of the Act, 21 U.S.C. section 360bbb-3(b)(1), unless the authorization is terminated or revoked.  Performed at [facility], [place] [address], [place], [place] [address]    WBC 10/04/2021 8.4  4.0 - 10.5 K/uL Final   RBC 10/04/2021 4.42  3.87 - 5.11 MIL/uL Final   Hemoglobin 10/04/2021 11.6 (L)  12.0 - 15.0 g/dL Final   HCT 10/04/2021 35.7 (L)  36.0 - 46.0 % Final   MCV 10/04/2021 80.8  80.0 - 100.0 fL Final   MCH 10/04/2021 26.2  26.0 - 34.0 pg Final   MCHC 10/04/2021 32.5  30.0 - 36.0 g/dL Final   RDW 10/04/2021 16.0 (H)  11.5 - 15.5 % Final   Platelets 10/04/2021 372  150 - 400 K/uL Final   nRBC 10/04/2021 0.0  0.0 - 0.2 % Final   Neutrophils Relative % 10/04/2021 68  % Final   Neutro Abs 10/04/2021 5.7  1.7 - 7.7 K/uL Final   Lymphocytes Relative 10/04/2021 22  % Final   Lymphs Abs 10/04/2021 1.8  0.7 - 4.0 K/uL Final   Monocytes Relative 10/04/2021 8  % Final   Monocytes Absolute 10/04/2021 0.7  0.1 - 1.0 K/uL Final   Eosinophils Relative 10/04/2021 1  % Final   Eosinophils Absolute 10/04/2021 0.1  0.0 - 0.5 K/uL Final   Basophils Relative 10/04/2021 1  %  Final   Basophils Absolute 10/04/2021 0.1  0.0 - 0.1 K/uL Final   Immature Granulocytes 10/04/2021 0  % Final   Abs Immature Granulocytes 10/04/2021 0.03  0.00 - 0.07 K/uL Final   Performed at [facility] [address], [place], [place] [address]   Sodium 10/04/2021 136  135 - 145 mmol/L Final   Potassium 10/04/2021 4.2  3.5 - 5.1 mmol/L Final   Chloride 10/04/2021 104  98 - 111 mmol/L Final   CO2 10/04/2021 25  22 - 32 mmol/L Final   Glucose, Bld 10/04/2021 100 (H)  70 - 99 mg/dL Final   Glucose reference range applies only to samples taken after fasting for at least 8 hours.   BUN 10/04/2021 9  6 - 20 mg/dL Final   Creatinine, Ser 10/04/2021  0.52  0.44 - 1.00 mg/dL Final   Calcium 10/04/2021 9.0  8.9 - 10.3 mg/dL Final   Total Protein 10/04/2021 7.0  6.5 - 8.1 g/dL Final   Albumin 10/04/2021 3.2 (L)  3.5 - 5.0 g/dL Final   AST 10/04/2021 13 (L)  15 - 41 U/L Final   ALT 10/04/2021 10  0 - 44 U/L Final   Alkaline Phosphatase 10/04/2021 61  38 - 126 U/L Final   Total Bilirubin 10/04/2021 0.3  0.3 - 1.2 mg/dL Final   GFR, Estimated 10/04/2021 >60  >60 mL/min Final   Comment: (NOTE) Calculated using the CKD-EPI Creatinine Equation (2021)    Anion gap 10/04/2021 7  5 - 15 Final   Performed at [facility] [address], [place], [place] [address]   Hgb A1c MFr Bld 10/04/2021 7.0 (H)  4.8 - 5.6 % Final   Comment: (NOTE) Pre diabetes:          5.7%-6.4%  Diabetes:              >6.4%  Glycemic control for   <7.0% adults with diabetes    Mean Plasma Glucose 10/04/2021 154.2  mg/dL Final   Performed at [facility], [place] [address], [place], [place] [address]   Cholesterol 10/04/2021 178  0 - 200 mg/dL Final   Triglycerides 10/04/2021 155 (H)  <150 mg/dL Final   HDL 10/04/2021 52  >40 mg/dL Final   Total CHOL/HDL Ratio 10/04/2021 3.4  RATIO Final   VLDL 10/04/2021 31  0 - 40 mg/dL Final   LDL Cholesterol 10/04/2021 95  0 - 99 mg/dL Final   Comment:        Total Cholesterol/HDL:CHD Risk Coronary Heart Disease Risk Table                     Men   Women  1/2 Average Risk   3.4   3.3  Average Risk       5.0   4.4  2 X Average Risk   9.6   7.1  3 X Average Risk  23.4   11.0        Use the calculated Patient Ratio above and the CHD Risk Table to determine the patient's CHD Risk.        ATP III CLASSIFICATION (LDL):  <100     mg/dL   Optimal  100-129  mg/dL   Near or Above                    Optimal  130-159  mg/dL   Borderline  160-189  mg/dL   High  >190  mg/dL   Very High Performed at [facility], [place] [address] [place], [place] [address]    POC Amphetamine UR 10/04/2021 None Detected  NONE  DETECTED (Cut Off Level 1000 ng/mL) Final   POC Secobarbital (BAR) 10/04/2021 None Detected  NONE DETECTED (Cut Off Level 300 ng/mL) Final   POC Buprenorphine (BUP) 10/04/2021 None Detected  NONE DETECTED (Cut Off Level 10 ng/mL) Final   POC Oxazepam (BZO) 10/04/2021 None Detected  NONE DETECTED (Cut Off Level 300 ng/mL) Final   POC Cocaine UR 10/04/2021 None Detected  NONE DETECTED (Cut Off Level 300 ng/mL) Final   POC Methamphetamine UR 10/04/2021 None Detected  NONE DETECTED (Cut Off Level 1000 ng/mL) Final   POC Morphine 10/04/2021 None Detected  NONE DETECTED (Cut Off Level 300 ng/mL) Final   POC Methadone UR 10/04/2021 None Detected  NONE DETECTED (Cut Off Level 300 ng/mL) Final   POC Oxycodone UR 10/04/2021 Positive (A)  NONE DETECTED (Cut Off Level 100 ng/mL) Final   POC Marijuana UR 10/04/2021 None Detected  NONE DETECTED (Cut Off Level 50 ng/mL) Final   SARSCOV2ONAVIRUS 2 AG 10/04/2021 NEGATIVE  NEGATIVE Final   Comment: (NOTE) SARS-CoV-2 antigen NOT DETECTED.   Negative results are presumptive.  Negative results do not preclude SARS-CoV-2 infection and should not be used as the sole basis for treatment or other patient management decisions, including infection  control decisions, particularly in the presence of clinical signs and  symptoms consistent with COVID-19, or in those who have been in contact with the virus.  Negative results must be combined with clinical observations, patient history, and epidemiological information. The expected result is Negative.  Fact Sheet for Patients: HandmadeRecipes.com.cy  Fact Sheet for Healthcare Providers: FuneralLife.at  This test is not yet approved or cleared by the [place] FDA and  has been authorized for detection and/or diagnosis of SARS-CoV-2 by FDA under an Emergency Use Authorization (EUA).  This EUA will remain in effect (meaning this test can be used) for the duration of   the COV                          ID-19 declaration under Section 564(b)(1) of the Act, 21 U.S.C. section 360bbb-3(b)(1), unless the authorization is terminated or revoked sooner.     Valproic Acid Lvl 10/04/2021 51  50.0 - 100.0 ug/mL Final   Performed at [facility] [address], [place], [place] [address]   Valproic Acid Lvl 10/08/2021 57  50.0 - 100.0 ug/mL Final   Performed at [facility] [address] [place], [place] [address]   Glucose-Capillary 10/09/2021 123 (H)  70 - 99 mg/dL Final   Glucose reference range applies only to samples taken after fasting for at least 8 hours.  Admission on 09/09/2021, Discharged on 09/10/2021  Component Date Value Ref Range Status   Sodium 09/09/2021 134 (L)  135 - 145 mmol/L Final   Potassium 09/09/2021 4.3  3.5 - 5.1 mmol/L Final   Chloride 09/09/2021 99  98 - 111 mmol/L Final   CO2 09/09/2021 25  22 - 32 mmol/L Final   Glucose, Bld 09/09/2021 107 (H)  70 - 99 mg/dL Final   Glucose reference range applies only to samples taken after fasting for at least 8 hours.   BUN 09/09/2021 12  6 - 20 mg/dL Final   Creatinine, Ser 09/09/2021 0.74  0.44 - 1.00 mg/dL Final   Calcium 09/09/2021  9.0  8.9 - 10.3 mg/dL Final   Total Protein 09/09/2021 7.4  6.5 - 8.1 g/dL Final   Albumin 09/09/2021 3.1 (L)  3.5 - 5.0 g/dL Final   AST 09/09/2021 13 (L)  15 - 41 U/L Final   ALT 09/09/2021 13  0 - 44 U/L Final   Alkaline Phosphatase 09/09/2021 66  38 - 126 U/L Final   Total Bilirubin 09/09/2021 0.2 (L)  0.3 - 1.2 mg/dL Final   GFR, Estimated 09/09/2021 >60  >60 mL/min Final   Comment: (NOTE) Calculated using the CKD-EPI Creatinine Equation (2021)    Anion gap 09/09/2021 10  5 - 15 Final   Performed at [facility], [place] [address], [place], [place] [address]   WBC 09/09/2021 8.5  4.0 - 10.5 K/uL Final   RBC 09/09/2021 4.53  3.87 - 5.11 MIL/uL Final   Hemoglobin 09/09/2021 11.8 (L)  12.0 - 15.0 g/dL Final   HCT 09/09/2021 38.0  36.0  - 46.0 % Final   MCV 09/09/2021 83.9  80.0 - 100.0 fL Final   MCH 09/09/2021 26.0  26.0 - 34.0 pg Final   MCHC 09/09/2021 31.1  30.0 - 36.0 g/dL Final   RDW 09/09/2021 17.8 (H)  11.5 - 15.5 % Final   Platelets 09/09/2021 442 (H)  150 - 400 K/uL Final   nRBC 09/09/2021 0.0  0.0 - 0.2 % Final   Neutrophils Relative % 09/09/2021 70  % Final   Neutro Abs 09/09/2021 5.9  1.7 - 7.7 K/uL Final   Lymphocytes Relative 09/09/2021 20  % Final   Lymphs Abs 09/09/2021 1.7  0.7 - 4.0 K/uL Final   Monocytes Relative 09/09/2021 8  % Final   Monocytes Absolute 09/09/2021 0.7  0.1 - 1.0 K/uL Final   Eosinophils Relative 09/09/2021 1  % Final   Eosinophils Absolute 09/09/2021 0.1  0.0 - 0.5 K/uL Final   Basophils Relative 09/09/2021 1  % Final   Basophils Absolute 09/09/2021 0.1  0.0 - 0.1 K/uL Final   Immature Granulocytes 09/09/2021 0  % Final   Abs Immature Granulocytes 09/09/2021 0.03  0.00 - 0.07 K/uL Final   Performed at [facility], [place] [address], [place], [place] [address]   Ammonia 09/09/2021 37 (H)  9 - 35 umol/L Final   Comment: HEMOLYSIS AT THIS LEVEL MAY AFFECT RESULT Performed at [facility], [place] [address] [place], [place] [address]    Opiates 09/09/2021 NONE DETECTED  NONE DETECTED Final   Cocaine 09/09/2021 NONE DETECTED  NONE DETECTED Final   Benzodiazepines 09/09/2021 NONE DETECTED  NONE DETECTED Final   Amphetamines 09/09/2021 NONE DETECTED  NONE DETECTED Final   Tetrahydrocannabinol 09/09/2021 NONE DETECTED  NONE DETECTED Final   Barbiturates 09/09/2021 NONE DETECTED  NONE DETECTED Final   Comment: (NOTE) DRUG SCREEN FOR MEDICAL PURPOSES ONLY.  IF CONFIRMATION IS NEEDED FOR ANY PURPOSE, NOTIFY LAB WITHIN 5 DAYS.  LOWEST DETECTABLE LIMITS FOR URINE DRUG SCREEN Drug Class                     Cutoff (ng/mL) Amphetamine and metabolites    1000 Barbiturate and metabolites    200 Benzodiazepine                 671 Tricyclics and metabolites     300 Opiates and  metabolites        300 Cocaine and metabolites        300 THC  50 Performed at [facility], [place] [address], [place], [place] [address]    Alcohol, Ethyl (B) 09/09/2021 <10  <10 mg/dL Final   Comment: (NOTE) Lowest detectable limit for serum alcohol is 10 mg/dL.  For medical purposes only. Performed at [facility], [place] [address], [place], [place] [address]    Lipase 09/09/2021 33  11 - 51 U/L Final   Performed at [facility] [address] [place], [place] [address]   Color, Urine 09/09/2021 COLORLESS (A)  YELLOW Final   APPearance 09/09/2021 CLEAR  CLEAR Final   Specific Gravity, Urine 09/09/2021 1.002 (L)  1.005 - 1.030 Final   pH 09/09/2021 6.0  5.0 - 8.0 Final   Glucose, UA 09/09/2021 NEGATIVE  NEGATIVE mg/dL Final   Hgb urine dipstick 09/09/2021 NEGATIVE  NEGATIVE Final   Bilirubin Urine 09/09/2021 NEGATIVE  NEGATIVE Final   Ketones, ur 09/09/2021 NEGATIVE  NEGATIVE mg/dL Final   Protein, ur 09/09/2021 NEGATIVE  NEGATIVE mg/dL Final   Nitrite 09/09/2021 NEGATIVE  NEGATIVE Final   Leukocytes,Ua 09/09/2021 NEGATIVE  NEGATIVE Final   Performed at [facility] [address] [place], [place] [address]   Specimen Description 09/09/2021 URINE, CLEAN CATCH   Final   Special Requests 09/09/2021 NONE   Final   Culture 09/09/2021  (A)   Final                   Value:<10,000 COLONIES/mL INSIGNIFICANT GROWTH Performed at [facility], [place] [address], [place], [place] [address]    Report Status 09/09/2021 09/10/2021 FINAL   Final   D-Dimer, Quant 09/09/2021 0.92 (H)  0.00 - 0.50 ug/mL-FEU Final   Comment: (NOTE) At the manufacturer cut-off value of 0.5 g/mL FEU, this assay has a negative predictive value of 95-100%.This assay is intended for use in conjunction with a clinical pretest probability (PTP) assessment model to exclude pulmonary embolism (PE) and deep venous thrombosis (DVT) in outpatients suspected of PE or  DVT. Results should be correlated with clinical presentation. Performed at [facility], [place] [address], [place], [place] [address]    Troponin I (High Sensitivity) 09/09/2021 5  <18 ng/L Final   Comment: (NOTE) Elevated high sensitivity troponin I (hsTnI) values and significant  changes across serial measurements may suggest ACS but many other  chronic and acute conditions are known to elevate hsTnI results.  Refer to the "Links" section for chest pain algorithms and additional  guidance. Performed at [facility], [place] [address], [place], [place] [address]    BP 09/10/2021 135/83  mmHg Final   S' Lateral 09/10/2021 2.30  cm Final   Area-P 1/2 09/10/2021 5.58  cm2 Final    Blood Alcohol level:  Lab Results  Component Value Date   ETH <10 11/23/2021   ETH <10 01/74/9449    Metabolic Disorder Labs: Lab Results  Component Value Date   HGBA1C 7.0 (H) 10/04/2021   MPG 154.2 10/04/2021   Lab Results  Component Value Date   PROLACTIN 3.8 (L) 11/23/2021   Lab Results  Component Value Date   CHOL 171 11/23/2021   TRIG 125 11/23/2021   HDL 65 11/23/2021   CHOLHDL 2.6 11/23/2021   VLDL 25 11/23/2021   LDLCALC 81 11/23/2021   LDLCALC 95 10/04/2021    Therapeutic Lab Levels: No results found for: "LITHIUM" Lab Results  Component Value Date   VALPROATE 54 12/03/2021   VALPROATE 45 (L)  11/09/2021   No results found for: "CBMZ"  Physical Findings   PHQ2-9    [place] ED from 11/23/2021 in [facility]  PHQ-2 Total Score 2  PHQ-9 Total Score 3      Flowsheet Row ED from 11/23/2021 in [facility] Most recent reading at 11/23/2021  6:44 PM ED from 11/22/2021 in [place] [facility] Most recent reading at 11/22/2021  8:28 PM ED from 11/22/2021 in [place] [facility] Most recent reading at 11/22/2021  1:24 AM  C-SSRS RISK CATEGORY No Risk No Risk No  Risk        Musculoskeletal  Strength & Muscle Tone: within normal limits Gait & Station: normal Patient leans: N/A  Psychiatric Specialty Exam  Presentation  General Appearance:  Appropriate for Environment  Eye Contact: Fair  Speech: Clear and Coherent  Speech Volume: Normal  Handedness: Left   Mood and Affect  Mood: Euthymic  Affect: Congruent   Thought Process  Thought Processes: Coherent  Descriptions of Associations:Intact  Orientation:Full (Time, Place and Person)  Thought Content:Logical  Diagnosis of Schizophrenia or Schizoaffective disorder in past: Yes  Duration of Psychotic Symptoms: No data recorded  Hallucinations:Hallucinations: None  Ideas of Reference:None  Suicidal Thoughts:Suicidal Thoughts: No  Homicidal Thoughts:Homicidal Thoughts: No   Sensorium  Memory: Immediate Fair; Recent Fair  Judgment: Intact  Insight: Present   Executive Functions  Concentration: Fair  Attention Span: Fair  Recall: [employer] of Knowledge: Fair  Language: Fair   Psychomotor Activity  Psychomotor Activity: Psychomotor Activity: Normal   Assets  Assets: Communication Skills; Desire for Improvement   Sleep  Sleep: Sleep: Fair Number of Hours of Sleep: 8   Physical Exam  Physical Exam HENT:     Head: Normocephalic.  Eyes:     Conjunctiva/sclera: Conjunctivae normal.  Cardiovascular:     Rate and Rhythm: Normal rate.  Pulmonary:     Effort: Pulmonary effort is normal.  Musculoskeletal:        General: Normal range of motion.     Cervical back: Normal range of motion.  Neurological:     Mental Status: She is alert and oriented to person, place, and time.    Review of Systems  Constitutional: Negative.   HENT: Negative.    Eyes: Negative.   Respiratory: Negative.    Cardiovascular: Negative.   Musculoskeletal: Negative.   Endo/Heme/Allergies: Negative.    Blood pressure 131/72, pulse 88, temperature  97.6 F (36.4 C), temperature source Oral, resp. rate 19, SpO2 98 %. There is no height or weight on file to calculate BMI.  Treatment Plan Summary:  Patient psychiatrically cleared for discharge. TOC team along with [facility] health care manager and case manager and Adult [employer] representative continues to seek placement for [place].   Continue current medication regimen. No medication changes made on 12/20/21   apixaban  5 mg Oral BID   clozapine  50 mg Oral Daily   cloZAPine  75 mg Oral QHS   diltiazem  240 mg Oral Daily   divalproex  500 mg Oral BID   fluticasone furoate-vilanterol  1 puff Inhalation Daily   insulin aspart  0-9 Units Subcutaneous TID WC   insulin glargine-yfgn  10 Units Subcutaneous Daily   latanoprost  1 drop Both Eyes QHS   menthol-cetylpyridinium  1 lozenge Oral Once   metFORMIN  1,000 mg Oral BID WC   nicotine  7 mg Transdermal Daily  Vitamin D (Ergocalciferol)  50,000 Units Oral Q7 days    Marissa Calamity, NP 12/20/2021 11:47 AM

## 2021-12-20 NOTE — ED Notes (Signed)
Pt sitting on her bed watching television talking with pt next to her. Pt is calm and cooperative. She has no c/o of pain or distress@this  time. Will continue to monitor for safety

## 2021-12-20 NOTE — ED Notes (Signed)
Pt sleeping, audible snoring noted. No s/s of resp distress. Will continue to monitor for safety

## 2021-12-20 NOTE — ED Notes (Signed)
Pt sleeping in recliner bed. No noted distress. Will continue to monitor for safety 

## 2021-12-20 NOTE — ED Notes (Signed)
Pt awake, no c/o gas or abd. Pain. No noted distress. Will continue to monitor for safety

## 2021-12-21 ENCOUNTER — Telehealth (HOSPITAL_COMMUNITY): Payer: Self-pay | Admitting: Physician Assistant

## 2021-12-21 ENCOUNTER — Telehealth: Payer: Self-pay | Admitting: Cardiology

## 2021-12-21 DIAGNOSIS — F200 Paranoid schizophrenia: Secondary | ICD-10-CM | POA: Diagnosis not present

## 2021-12-21 LAB — GLUCOSE, CAPILLARY
Glucose-Capillary: 127 mg/dL — ABNORMAL HIGH (ref 70–99)
Glucose-Capillary: 104 mg/dL — ABNORMAL HIGH (ref 70–99)
Glucose-Capillary: 106 mg/dL — ABNORMAL HIGH (ref 70–99)
Glucose-Capillary: 102 mg/dL — ABNORMAL HIGH (ref 70–99)
Glucose-Capillary: 223 mg/dL — ABNORMAL HIGH (ref 70–99)

## 2021-12-21 MED ORDER — MENTHOL 3 MG MT LOZG
1.0000 | LOZENGE | OROMUCOSAL | Status: DC | PRN
  Administered 2021-12-21 – 2022-01-22 (×27): 3 mg via ORAL
  Filled 2021-12-21 (×9): qty 9

## 2021-12-21 MED ORDER — MENTHOL 3 MG MT LOZG
1.0000 | LOZENGE | Freq: Once | OROMUCOSAL | Status: AC
  Administered 2022-01-15: 3 mg via ORAL

## 2021-12-21 NOTE — ED Notes (Signed)
Pt A&O x 4, no distress noted, calm & cooperative.  Monitoring for safety. 

## 2021-12-21 NOTE — Telephone Encounter (Signed)
Spoke with Chrissy from [facility]. She wanted to get proof for the [facility] that she scheduled patient to see Dr. Harl Bowie on 7/28; 8/15; 8/24; and 11/09/2021. She stated each time she made appointment, they had to be cancelled because patient was admitted to the ED. I gave her the number to [facility] [phone].

## 2021-12-21 NOTE — BH Assessment (Signed)
Care Management   Patient is under review for placement at [facility].  The name of the owner is [name] and her number is [phone].  The facility is located in [place].  Per [place] with [facility] [phone]) the referral for placement at [facility] was submitted today.     Writer left a voicemail message for the patient Housing Coordinator Beckie Salts [phone]) to obtain a status update.

## 2021-12-21 NOTE — ED Provider Notes (Signed)
Behavioral Health Progress Note  Date and Time: 12/21/2021 10:51 AM Name: Teresa Murillo MRN:  [id number]  HPI: Teresa Murillo is a 61 y.o. female with a past psychiatric history of schizophrenia, aggressive behavior, and possible intellectual disability presenting to [facility] on 11/23/21 voluntarily as a walk in via [facility] with complaints that she was locked out of the group home. Patient has been dismissed from group home due to multiple elopements. Pt had already been discharged from this facility but had been living there temporarily while DSS was looking for new placement. Pt is boarding at [facility].  Completed an AID to Capacity Evaluation (ACE) with attending Dr. Dwyane Dee on 12/11/2021.  Please see media tab for full details.   Completed test with [name], PhD: Wechsler Adult Intelligence Scale-4, Ms. Trigg achieved a full-scale IQ score of 73 and a percentile rank of 4 placing her in the borderline range of intellectual functioning (12/14/2021) Please see consult note from [name], PhD on 12/14/2021  Subjective:  Patient was seen initially awake laying in bed, comfortable.  Reported that she does not want to talk today, because her pile of excess clothes were taken.  Denied SI/HI/AVH.  Denied somatic concerns.  Patient had no other questions or concerns.  DG:[id number] Thoughts: No (Contracted to safety) IR:JJOACZYSA Thoughts: No YTK:[id number]: None Ideas of KGU:RKYH   Mood: Dysphoric Sleep:Good Appetite: Good   Review of Systems  Constitutional:        Denied pain  HENT:  Negative for sore throat.   Respiratory:  Negative for shortness of breath.   Cardiovascular:  Negative for chest pain.  Gastrointestinal:  Negative for abdominal pain, constipation and diarrhea.  Neurological:  Negative for dizziness and headaches.    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 On the Wechsler Adult Intelligence Scale-4, Ms. Zou achieved a full-scale IQ score of 73 and a percentile rank of 4 placing her in the borderline range of intellectual functioning.    Chronic obstructive pulmonary disease (COPD) (HCC)    Glaucoma    Hyperlipidemia    Hypertension    Iron deficiency    Schizoaffective disorder (HCC)    Type 2 diabetes mellitus (HCC)     Past Surgical History:  Procedure Laterality Date   TUBAL LIGATION     Family History:  Family History  Problem Relation Age of Onset   Breast cancer Maternal Grandmother    Family Psychiatric  History: none reported Social History:  Social History   Substance and Sexual Activity  Alcohol Use Yes     Social History   Substance and Sexual Activity  Drug Use Not Currently    Social History   Socioeconomic History   Marital status: Divorced    Spouse name: Not on file   Number of children: Not on file   Years of education: Not on file   Highest education level: Not on file  Occupational History   Not on file  Tobacco Use   Smoking status: Every Day    Packs/day: 1.00    Types: Cigars, Cigarettes   Smokeless tobacco: Current  Vaping Use   Vaping Use: Never used  Substance  and Sexual Activity   Alcohol use: Yes   Drug use: Not Currently   Sexual activity: Not Currently    Birth control/protection: Surgical  Other Topics Concern   Not on file  Social History Narrative   Not on file   Social Determinants of Health   Financial Resource Strain: Not on file  Food Insecurity: Not on file  Transportation Needs: Not on file  Physical Activity: Not on file  Stress: Not on file   Social Connections: Not on file   SDOH:  SDOH Screenings   Depression (PHQ2-9): Low Risk  (11/23/2021)  Tobacco Use: High Risk (12/14/2021)   Additional Social History:    Pain Medications: See MAR Prescriptions: See MAR Over the Counter: See MAR History of alcohol / drug use?: No history of alcohol / drug abuse Longest period of sobriety (when/how long): N/A                    Current Medications:  Current Facility-Administered Medications  Medication Dose Route Frequency Provider Last Rate Last Admin   acetaminophen (TYLENOL) tablet 650 mg  650 mg Oral Q6H PRN Rankin, Shuvon B, NP   650 mg at 12/16/21 2156   albuterol (VENTOLIN HFA) 108 (90 Base) MCG/ACT inhaler 2 puff  2 puff Inhalation Q6H PRN Rankin, Shuvon B, NP   2 puff at 12/09/21 2102   alum & mag hydroxide-simeth (MAALOX/MYLANTA) 200-200-20 MG/5ML suspension 30 mL  30 mL Oral Q4H PRN Rankin, Shuvon B, NP   30 mL at 12/19/21 2109   apixaban (ELIQUIS) tablet 5 mg  5 mg Oral BID Rankin, Shuvon B, NP   5 mg at 12/21/21 0919   cloZAPine (CLOZARIL) tablet 50 mg  50 mg Oral Daily [name], NP   50 mg at 12/21/21 0919   cloZAPine (CLOZARIL) tablet 75 mg  75 mg Oral QHS [name], NP   75 mg at 12/20/21 2133   diltiazem (CARDIZEM CD) 24 hr capsule 240 mg  240 mg Oral Daily Rankin, Shuvon B, NP   240 mg at 12/21/21 0920   divalproex (DEPAKOTE ER) 24 hr tablet 500 mg  500 mg Oral BID Rankin, Shuvon B, NP   500 mg at 12/21/21 0919   fluticasone furoate-vilanterol (BREO ELLIPTA) 200-25 MCG/ACT 1 puff  1 puff Inhalation Daily Rankin, Shuvon B, NP   1 puff at 12/21/21 0817   haloperidol (HALDOL) tablet 10 mg  10 mg Oral Q8H PRN Rankin, Shuvon B, NP       insulin aspart (novoLOG) injection 0-9 Units  0-9 Units Subcutaneous TID WC Tharon Aquas, NP   2 Units at 12/20/21 1702   insulin glargine-yfgn (SEMGLEE) injection 10 Units  10 Units Subcutaneous Daily [name], DO   10 Units at 12/21/21 0920   latanoprost  (XALATAN) 0.005 % ophthalmic solution 1 drop  1 drop Both Eyes QHS Rankin, Shuvon B, NP   1 drop at 12/20/21 2135   magnesium hydroxide (MILK OF MAGNESIA) suspension 30 mL  30 mL Oral Daily PRN Rankin, Shuvon B, NP       metFORMIN (GLUCOPHAGE-XR) 24 hr tablet 1,000 mg  1,000 mg Oral BID WC [name], DO   1,000 mg at 12/21/21 0817   nicotine (NICODERM CQ - dosed in mg/24 hr) patch 7 mg  7 mg Transdermal Daily Rankin, Shuvon B, NP   7 mg at 12/21/21 0926   Vitamin D (Ergocalciferol) (DRISDOL) 1.25 MG (50000 UNIT) capsule 50,000 Units  50,000  Units Oral Q7 days Rankin, Shuvon B, NP   50,000 Units at 12/17/21 0930   Current Outpatient Medications  Medication Sig Dispense Refill   Accu-Chek Softclix Lancets lancets Use as directed up to four times daily 100 each 0   apixaban (ELIQUIS) 5 MG TABS tablet Take 1 tablet (5 mg total) by mouth 2 (two) times daily. 60 tablet 0   ARIPiprazole (ABILIFY) 10 MG tablet Take 1 tablet (10 mg total) by mouth daily. 30 tablet 0   Blood Glucose Monitoring Suppl (ACCU-CHEK GUIDE) w/Device KIT Use as directed up to four times daily 1 kit 0   budesonide-formoterol (SYMBICORT) 160-4.5 MCG/ACT inhaler Inhale 2 puffs into the lungs in the morning and at bedtime.     Cholecalciferol (VITAMIN D3) 1.25 MG (50000 UT) CAPS Take 50,000 Units by mouth every Thursday.     cloZAPine (CLOZARIL) 25 MG tablet Take 3 tablets (75 mg total) by mouth at bedtime. 90 tablet 0   clozapine (CLOZARIL) 50 MG tablet Take 1 tablet (50 mg total) by mouth daily. 30 tablet 0   diltiazem (CARDIZEM CD) 240 MG 24 hr capsule Take 1 capsule (240 mg total) by mouth daily. (Patient not taking: Reported on 11/24/2021) 30 capsule 0   divalproex (DEPAKOTE ER) 500 MG 24 hr tablet Take 1 tablet (500 mg total) by mouth 2 (two) times daily. 60 tablet 0   glucose blood test strip Use as directed up to four times daily 50 each 0   haloperidol (HALDOL) 10 MG tablet Take 1 tablet (10 mg total) by mouth 3 (three)  times daily as needed for agitation (and psychotic symptoms).     INGREZZA 40 MG capsule Take 1 capsule (40 mg total) by mouth in the morning. 30 capsule 0   insulin aspart (NOVOLOG) 100 UNIT/ML FlexPen Before each meal 3 times a day, 140-199 - 2 units, 200-250 - 4 units, 251-299 - 6 units,  300-349 - 8 units,  350 or above 10 units. Insulin PEN if approved, provide syringes and needles if needed.Please switch to any approved short acting Insulin if needed. 15 mL 0   insulin glargine (LANTUS) 100 UNIT/ML Solostar Pen Inject 12 Units into the skin daily. 15 mL 0   Insulin Pen Needle 32G X 4 MM MISC Use 4 times a day with insulin, 1 month supply. 100 each 0   latanoprost (XALATAN) 0.005 % ophthalmic solution Place 1 drop into both eyes at bedtime.     metFORMIN (GLUCOPHAGE) 500 MG tablet Take 500 mg by mouth 2 (two) times daily with a meal.     nicotine (NICODERM CQ - DOSED IN MG/24 HR) 7 mg/24hr patch Place 1 patch (7 mg total) onto the skin daily. 28 patch 0   PROAIR HFA 108 (90 Base) MCG/ACT inhaler Inhale 2 puffs into the lungs every 6 (six) hours as needed for wheezing or shortness of breath.      Labs  Lab Results:  No results displayed because visit has over 200 results.    Admission on 11/22/2021, Discharged on 11/22/2021  Component Date Value Ref Range Status   Glucose-Capillary 11/22/2021 169 (H)  70 - 99 mg/dL Final   Glucose reference range applies only to samples taken after fasting for at least 8 hours.  No results displayed because visit has over 200 results.    Admission on 10/04/2021, Discharged on 10/22/2021  Component Date Value Ref Range Status   SARS Coronavirus 2 by RT PCR 10/04/2021 NEGATIVE  NEGATIVE Final  Comment: (NOTE) SARS-CoV-2 target nucleic acids are NOT DETECTED.  The SARS-CoV-2 RNA is generally detectable in upper respiratory specimens during the acute phase of infection. The lowest concentration of SARS-CoV-2 viral copies this assay can detect is 138  copies/mL. A negative result does not preclude SARS-Cov-2 infection and should not be used as the sole basis for treatment or other patient management decisions. A negative result may occur with  improper specimen collection/handling, submission of specimen other than nasopharyngeal swab, presence of viral mutation(s) within the areas targeted by this assay, and inadequate number of viral copies(<138 copies/mL). A negative result must be combined with clinical observations, patient history, and epidemiological information. The expected result is Negative.  Fact Sheet for Patients:  EntrepreneurPulse.com.au  Fact Sheet for Healthcare Providers:  IncredibleEmployment.be  This test is no                          t yet approved or cleared by the [place] FDA and  has been authorized for detection and/or diagnosis of SARS-CoV-2 by FDA under an Emergency Use Authorization (EUA). This EUA will remain  in effect (meaning this test can be used) for the duration of the COVID-19 declaration under Section 564(b)(1) of the Act, 21 U.S.C.section 360bbb-3(b)(1), unless the authorization is terminated  or revoked sooner.       Influenza A by PCR 10/04/2021 NEGATIVE  NEGATIVE Final   Influenza B by PCR 10/04/2021 NEGATIVE  NEGATIVE Final   Comment: (NOTE) The Xpert Xpress SARS-CoV-2/FLU/RSV plus assay is intended as an aid in the diagnosis of influenza from Nasopharyngeal swab specimens and should not be used as a sole basis for treatment. Nasal washings and aspirates are unacceptable for Xpert Xpress SARS-CoV-2/FLU/RSV testing.  Fact Sheet for Patients: EntrepreneurPulse.com.au  Fact Sheet for Healthcare Providers: IncredibleEmployment.be  This test is not yet approved or cleared by the [place] FDA and has been authorized for detection and/or diagnosis of SARS-CoV-2 by FDA under an Emergency Use Authorization  (EUA). This EUA will remain in effect (meaning this test can be used) for the duration of the COVID-19 declaration under Section 564(b)(1) of the Act, 21 U.S.C. section 360bbb-3(b)(1), unless the authorization is terminated or revoked.  Performed at [facility], [place] [address] [place], [place] [address]    WBC 10/04/2021 8.4  4.0 - 10.5 K/uL Final   RBC 10/04/2021 4.42  3.87 - 5.11 MIL/uL Final   Hemoglobin 10/04/2021 11.6 (L)  12.0 - 15.0 g/dL Final   HCT 10/04/2021 35.7 (L)  36.0 - 46.0 % Final   MCV 10/04/2021 80.8  80.0 - 100.0 fL Final   MCH 10/04/2021 26.2  26.0 - 34.0 pg Final   MCHC 10/04/2021 32.5  30.0 - 36.0 g/dL Final   RDW 10/04/2021 16.0 (H)  11.5 - 15.5 % Final   Platelets 10/04/2021 372  150 - 400 K/uL Final   nRBC 10/04/2021 0.0  0.0 - 0.2 % Final   Neutrophils Relative % 10/04/2021 68  % Final   Neutro Abs 10/04/2021 5.7  1.7 - 7.7 K/uL Final   Lymphocytes Relative 10/04/2021 22  % Final   Lymphs Abs 10/04/2021 1.8  0.7 - 4.0 K/uL Final   Monocytes Relative 10/04/2021 8  % Final   Monocytes Absolute 10/04/2021 0.7  0.1 - 1.0 K/uL Final   Eosinophils Relative 10/04/2021 1  % Final   Eosinophils Absolute 10/04/2021 0.1  0.0 - 0.5 K/uL Final  Basophils Relative 10/04/2021 1  % Final   Basophils Absolute 10/04/2021 0.1  0.0 - 0.1 K/uL Final   Immature Granulocytes 10/04/2021 0  % Final   Abs Immature Granulocytes 10/04/2021 0.03  0.00 - 0.07 K/uL Final   Performed at [facility], [place] [address], [place], [place] [address]   Sodium 10/04/2021 136  135 - 145 mmol/L Final   Potassium 10/04/2021 4.2  3.5 - 5.1 mmol/L Final   Chloride 10/04/2021 104  98 - 111 mmol/L Final   CO2 10/04/2021 25  22 - 32 mmol/L Final   Glucose, Bld 10/04/2021 100 (H)  70 - 99 mg/dL Final   Glucose reference range applies only to samples taken after fasting for at least 8 hours.   BUN 10/04/2021 9  6 - 20 mg/dL Final   Creatinine, Ser 10/04/2021 0.52  0.44 - 1.00 mg/dL Final    Calcium 10/04/2021 9.0  8.9 - 10.3 mg/dL Final   Total Protein 10/04/2021 7.0  6.5 - 8.1 g/dL Final   Albumin 10/04/2021 3.2 (L)  3.5 - 5.0 g/dL Final   AST 10/04/2021 13 (L)  15 - 41 U/L Final   ALT 10/04/2021 10  0 - 44 U/L Final   Alkaline Phosphatase 10/04/2021 61  38 - 126 U/L Final   Total Bilirubin 10/04/2021 0.3  0.3 - 1.2 mg/dL Final   GFR, Estimated 10/04/2021 >60  >60 mL/min Final   Comment: (NOTE) Calculated using the CKD-EPI Creatinine Equation (2021)    Anion gap 10/04/2021 7  5 - 15 Final   Performed at [facility] [address], [place], [place] [address]   Hgb A1c MFr Bld 10/04/2021 7.0 (H)  4.8 - 5.6 % Final   Comment: (NOTE) Pre diabetes:          5.7%-6.4%  Diabetes:              >6.4%  Glycemic control for   <7.0% adults with diabetes    Mean Plasma Glucose 10/04/2021 154.2  mg/dL Final   Performed at [facility], [place] [address] [place], [place] [address]   Cholesterol 10/04/2021 178  0 - 200 mg/dL Final   Triglycerides 10/04/2021 155 (H)  <150 mg/dL Final   HDL 10/04/2021 52  >40 mg/dL Final   Total CHOL/HDL Ratio 10/04/2021 3.4  RATIO Final   VLDL 10/04/2021 31  0 - 40 mg/dL Final   LDL Cholesterol 10/04/2021 95  0 - 99 mg/dL Final   Comment:        Total Cholesterol/HDL:CHD Risk Coronary Heart Disease Risk Table                     Men   Women  1/2 Average Risk   3.4   3.3  Average Risk       5.0   4.4  2 X Average Risk   9.6   7.1  3 X Average Risk  23.4   11.0        Use the calculated Patient Ratio above and the CHD Risk Table to determine the patient's CHD Risk.        ATP III CLASSIFICATION (LDL):  <100     mg/dL   Optimal  100-129  mg/dL   Near or Above                    Optimal  130-159  mg/dL   Borderline  160-189  mg/dL   High  >190     mg/dL   Very High Performed at [facility] [address], [place], [place] [address]    POC Amphetamine UR 10/04/2021 None Detected  NONE DETECTED (Cut Off Level 1000  ng/mL) Final   POC Secobarbital (BAR) 10/04/2021 None Detected  NONE DETECTED (Cut Off Level 300 ng/mL) Final   POC Buprenorphine (BUP) 10/04/2021 None Detected  NONE DETECTED (Cut Off Level 10 ng/mL) Final   POC Oxazepam (BZO) 10/04/2021 None Detected  NONE DETECTED (Cut Off Level 300 ng/mL) Final   POC Cocaine UR 10/04/2021 None Detected  NONE DETECTED (Cut Off Level 300 ng/mL) Final   POC Methamphetamine UR 10/04/2021 None Detected  NONE DETECTED (Cut Off Level 1000 ng/mL) Final   POC Morphine 10/04/2021 None Detected  NONE DETECTED (Cut Off Level 300 ng/mL) Final   POC Methadone UR 10/04/2021 None Detected  NONE DETECTED (Cut Off Level 300 ng/mL) Final   POC Oxycodone UR 10/04/2021 Positive (A)  NONE DETECTED (Cut Off Level 100 ng/mL) Final   POC Marijuana UR 10/04/2021 None Detected  NONE DETECTED (Cut Off Level 50 ng/mL) Final   SARSCOV2ONAVIRUS 2 AG 10/04/2021 NEGATIVE  NEGATIVE Final   Comment: (NOTE) SARS-CoV-2 antigen NOT DETECTED.   Negative results are presumptive.  Negative results do not preclude SARS-CoV-2 infection and should not be used as the sole basis for treatment or other patient management decisions, including infection  control decisions, particularly in the presence of clinical signs and  symptoms consistent with COVID-19, or in those who have been in contact with the virus.  Negative results must be combined with clinical observations, patient history, and epidemiological information. The expected result is Negative.  Fact Sheet for Patients: HandmadeRecipes.com.cy  Fact Sheet for Healthcare Providers: FuneralLife.at  This test is not yet approved or cleared by the [place] FDA and  has been authorized for detection and/or diagnosis of SARS-CoV-2 by FDA under an Emergency Use Authorization (EUA).  This EUA will remain in effect (meaning this test can be used) for the duration of  the COV                           ID-19 declaration under Section 564(b)(1) of the Act, 21 U.S.C. section 360bbb-3(b)(1), unless the authorization is terminated or revoked sooner.     Valproic Acid Lvl 10/04/2021 51  50.0 - 100.0 ug/mL Final   Performed at [facility] [address] [place], [place] [address]   Valproic Acid Lvl 10/08/2021 57  50.0 - 100.0 ug/mL Final   Performed at [facility] [address] [place], [place] [address]   Glucose-Capillary 10/09/2021 123 (H)  70 - 99 mg/dL Final   Glucose reference range applies only to samples taken after fasting for at least 8 hours.  Admission on 09/09/2021, Discharged on 09/10/2021  Component Date Value Ref Range Status   Sodium 09/09/2021 134 (L)  135 - 145 mmol/L Final   Potassium 09/09/2021 4.3  3.5 - 5.1 mmol/L Final   Chloride 09/09/2021 99  98 - 111 mmol/L Final   CO2 09/09/2021 25  22 - 32 mmol/L Final   Glucose, Bld 09/09/2021 107 (H)  70 - 99 mg/dL Final   Glucose reference range applies only to samples taken after fasting for at least 8 hours.   BUN 09/09/2021 12  6 - 20 mg/dL Final   Creatinine, Ser 09/09/2021 0.74  0.44 - 1.00 mg/dL Final   Calcium 09/09/2021 9.0  8.9 - 10.3 mg/dL Final   Total Protein 09/09/2021 7.4  6.5 - 8.1 g/dL Final   Albumin 09/09/2021 3.1 (L)  3.5 - 5.0 g/dL Final   AST 09/09/2021 13 (L)  15 - 41 U/L Final   ALT 09/09/2021 13  0 - 44 U/L Final   Alkaline Phosphatase 09/09/2021 66  38 - 126 U/L Final   Total Bilirubin 09/09/2021 0.2 (L)  0.3 - 1.2 mg/dL Final   GFR, Estimated 09/09/2021 >60  >60 mL/min Final   Comment: (NOTE) Calculated using the CKD-EPI Creatinine Equation (2021)    Anion gap 09/09/2021 10  5 - 15 Final   Performed at [facility] [address], [place], [place] [address]   WBC 09/09/2021 8.5  4.0 - 10.5 K/uL Final   RBC 09/09/2021 4.53  3.87 - 5.11 MIL/uL Final   Hemoglobin 09/09/2021 11.8 (L)  12.0 - 15.0 g/dL Final   HCT 09/09/2021 38.0  36.0 - 46.0 % Final   MCV 09/09/2021  83.9  80.0 - 100.0 fL Final   MCH 09/09/2021 26.0  26.0 - 34.0 pg Final   MCHC 09/09/2021 31.1  30.0 - 36.0 g/dL Final   RDW 09/09/2021 17.8 (H)  11.5 - 15.5 % Final   Platelets 09/09/2021 442 (H)  150 - 400 K/uL Final   nRBC 09/09/2021 0.0  0.0 - 0.2 % Final   Neutrophils Relative % 09/09/2021 70  % Final   Neutro Abs 09/09/2021 5.9  1.7 - 7.7 K/uL Final   Lymphocytes Relative 09/09/2021 20  % Final   Lymphs Abs 09/09/2021 1.7  0.7 - 4.0 K/uL Final   Monocytes Relative 09/09/2021 8  % Final   Monocytes Absolute 09/09/2021 0.7  0.1 - 1.0 K/uL Final   Eosinophils Relative 09/09/2021 1  % Final   Eosinophils Absolute 09/09/2021 0.1  0.0 - 0.5 K/uL Final   Basophils Relative 09/09/2021 1  % Final   Basophils Absolute 09/09/2021 0.1  0.0 - 0.1 K/uL Final   Immature Granulocytes 09/09/2021 0  % Final   Abs Immature Granulocytes 09/09/2021 0.03  0.00 - 0.07 K/uL Final   Performed at [facility], [place] [address], [place], [place] [address]   Ammonia 09/09/2021 37 (H)  9 - 35 umol/L Final   Comment: HEMOLYSIS AT THIS LEVEL MAY AFFECT RESULT Performed at [facility], [place] [address] [place], [place] [address]    Opiates 09/09/2021 NONE DETECTED  NONE DETECTED Final   Cocaine 09/09/2021 NONE DETECTED  NONE DETECTED Final   Benzodiazepines 09/09/2021 NONE DETECTED  NONE DETECTED Final   Amphetamines 09/09/2021 NONE DETECTED  NONE DETECTED Final   Tetrahydrocannabinol 09/09/2021 NONE DETECTED  NONE DETECTED Final   Barbiturates 09/09/2021 NONE DETECTED  NONE DETECTED Final   Comment: (NOTE) DRUG SCREEN FOR MEDICAL PURPOSES ONLY.  IF CONFIRMATION IS NEEDED FOR ANY PURPOSE, NOTIFY LAB WITHIN 5 DAYS.  LOWEST DETECTABLE LIMITS FOR URINE DRUG SCREEN Drug Class                     Cutoff (ng/mL) Amphetamine and metabolites    1000 Barbiturate and metabolites    200 Benzodiazepine                 798 Tricyclics and metabolites     300 Opiates and metabolites        300 Cocaine  and metabolites  300 THC                            50 Performed at [facility], [place] [address], [place], [place] [address]    Alcohol, Ethyl (B) 09/09/2021 <10  <10 mg/dL Final   Comment: (NOTE) Lowest detectable limit for serum alcohol is 10 mg/dL.  For medical purposes only. Performed at [facility], [place] [address] [place], [place] [address]    Lipase 09/09/2021 33  11 - 51 U/L Final   Performed at [facility] [address] [place], [place] [address]   Color, Urine 09/09/2021 COLORLESS (A)  YELLOW Final   APPearance 09/09/2021 CLEAR  CLEAR Final   Specific Gravity, Urine 09/09/2021 1.002 (L)  1.005 - 1.030 Final   pH 09/09/2021 6.0  5.0 - 8.0 Final   Glucose, UA 09/09/2021 NEGATIVE  NEGATIVE mg/dL Final   Hgb urine dipstick 09/09/2021 NEGATIVE  NEGATIVE Final   Bilirubin Urine 09/09/2021 NEGATIVE  NEGATIVE Final   Ketones, ur 09/09/2021 NEGATIVE  NEGATIVE mg/dL Final   Protein, ur 09/09/2021 NEGATIVE  NEGATIVE mg/dL Final   Nitrite 09/09/2021 NEGATIVE  NEGATIVE Final   Leukocytes,Ua 09/09/2021 NEGATIVE  NEGATIVE Final   Performed at [facility] [address], [place], [place] [address]   Specimen Description 09/09/2021 URINE, CLEAN CATCH   Final   Special Requests 09/09/2021 NONE   Final   Culture 09/09/2021  (A)   Final                   Value:<10,000 COLONIES/mL INSIGNIFICANT GROWTH Performed at [facility], [place] [address] [place],  [place] [address]    Report Status 09/09/2021 09/10/2021 FINAL   Final   D-Dimer, Quant 09/09/2021 0.92 (H)  0.00 - 0.50 ug/mL-FEU Final   Comment: (NOTE) At the manufacturer cut-off value of 0.5 g/mL FEU, this assay has a negative predictive value of 95-100%.This assay is intended for use in conjunction with a clinical pretest probability (PTP) assessment model to exclude pulmonary embolism (PE) and deep venous thrombosis (DVT) in outpatients suspected of PE or DVT. Results should be  correlated with clinical presentation. Performed at [facility], [place] [address] [place], [place] [address]    Troponin I (High Sensitivity) 09/09/2021 5  <18 ng/L Final   Comment: (NOTE) Elevated high sensitivity troponin I (hsTnI) values and significant  changes across serial measurements may suggest ACS but many other  chronic and acute conditions are known to elevate hsTnI results.  Refer to the "Links" section for chest pain algorithms and additional  guidance. Performed at [facility], [place] [address], [place], [place] [address]    BP 09/10/2021 135/83  mmHg Final   S' Lateral 09/10/2021 2.30  cm Final   Area-P 1/2 09/10/2021 5.58  cm2 Final    Blood Alcohol level:  Lab Results  Component Value Date   ETH <10 11/23/2021   ETH <10 73/22/0254    Metabolic Disorder Labs: Lab Results  Component Value Date   HGBA1C 7.0 (H) 10/04/2021   MPG 154.2 10/04/2021   Lab Results  Component Value Date   PROLACTIN 3.8 (L) 11/23/2021   Lab Results  Component Value Date   CHOL 171 11/23/2021   TRIG 125 11/23/2021   HDL 65 11/23/2021   CHOLHDL 2.6 11/23/2021   VLDL 25 11/23/2021   LDLCALC 81 11/23/2021   LDLCALC  95 10/04/2021    Therapeutic Lab Levels: No results found for: "LITHIUM" Lab Results  Component Value Date   VALPROATE 54 12/03/2021   VALPROATE 45 (L) 11/09/2021   No results found for: "CBMZ"  Physical Findings   PHQ2-9    Flowsheet Row ED from 11/23/2021 in [facility]  PHQ-2 Total Score 2  PHQ-9 Total Score 3      Flowsheet Row ED from 11/23/2021 in [facility] Most recent reading at 11/23/2021  6:44 PM ED from 11/22/2021 in [place] [facility] Most recent reading at 11/22/2021  8:28 PM ED from 11/22/2021 in [place] [facility] Most recent reading at 11/22/2021  1:24 AM  C-SSRS RISK CATEGORY No Risk No Risk No Risk         Musculoskeletal  Strength & Muscle Tone: within normal limits Gait & Station: normal Patient leans: N/A  Psychiatric Specialty Exam  Presentation  General Appearance:  Appropriate for Environment  Eye Contact: Fair  Speech: Clear and Coherent  Speech Volume: Normal  Handedness: Left  Mood and Affect  Mood: Dysphoric  Affect: Congruent   Thought Process  Thought Processes: Coherent  Descriptions of Associations:Intact  Orientation:Full (Time, Place and Person)  Thought Content:Logical  Diagnosis of Schizophrenia or Schizoaffective disorder in past: Yes  Hallucinations:Hallucinations: None   Ideas of Reference:None  Suicidal Thoughts:Suicidal Thoughts: No   Homicidal Thoughts:Homicidal Thoughts: No   Sensorium  Memory: Immediate Fair; Recent Fair  Judgment: Intact  Insight: Present   Executive Functions  Concentration: Fair  Attention Span: Fair  Recall: [employer] of Knowledge: Fair  Language: Fair   Psychomotor Activity  Psychomotor Activity: Psychomotor Activity: Normal  Assets  Assets: Communication Skills; Desire for Improvement   Sleep  Sleep: Sleep: Fair    Physical Exam  Physical Exam Vitals and nursing note reviewed.  Constitutional:      General: She is not in acute distress.    Appearance: She is not ill-appearing or diaphoretic.  HENT:     Head: Normocephalic and atraumatic.  Pulmonary:     Effort: Pulmonary effort is normal. No respiratory distress.  Neurological:     General: No focal deficit present.     Mental Status: She is alert.    Blood pressure 118/83, pulse 85, temperature 97.9 F (36.6 C), temperature source Oral, resp. rate 18, SpO2 95 %. There is no height or weight on file to calculate BMI.  Treatment Plan Summary: Kris remains cleared by psychiatry.   Facility [facility] team along with [facility] health care manager and case manager and Adult [employer] representative  continue to seek placement for [place].  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 apixaban, 5 mg, BID clozapine, 50 mg, Daily cloZAPine, 75 mg, QHS diltiazem, 240 mg, Daily divalproex, 500 mg, BID fluticasone furoate-vilanterol, 1 puff, Daily insulin aspart, 0-9 Units, TID WC insulin glargine-yfgn, 10 Units, Daily latanoprost, 1 drop, QHS metFORMIN, 1,000 mg, BID WC nicotine, 7 mg, Daily Vitamin D (Ergocalciferol), 50,000 Units, Q7 days    [name], DO 12/21/2021 10:51 AM

## 2021-12-21 NOTE — ED Notes (Signed)
Pt sleeping at present, no distress noted.  Monitoring for safety. 

## 2021-12-21 NOTE — Telephone Encounter (Signed)
Calling to get patient notes from her appt back in July. Please advise

## 2021-12-22 LAB — CBC WITH DIFFERENTIAL/PLATELET
Abs Immature Granulocytes: 0.02 10*3/uL (ref 0.00–0.07)
Basophils Absolute: 0.1 10*3/uL (ref 0.0–0.1)
Basophils Relative: 1 %
Eosinophils Absolute: 0.1 10*3/uL (ref 0.0–0.5)
Eosinophils Relative: 2 %
HCT: 36.1 % (ref 36.0–46.0)
Hemoglobin: 11.4 g/dL — ABNORMAL LOW (ref 12.0–15.0)
Immature Granulocytes: 0 %
Lymphocytes Relative: 35 %
Lymphs Abs: 2.2 10*3/uL (ref 0.7–4.0)
MCH: 25.2 pg — ABNORMAL LOW (ref 26.0–34.0)
MCHC: 31.6 g/dL (ref 30.0–36.0)
MCV: 79.7 fL — ABNORMAL LOW (ref 80.0–100.0)
Monocytes Absolute: 0.5 10*3/uL (ref 0.1–1.0)
Monocytes Relative: 8 %
Neutro Abs: 3.2 10*3/uL (ref 1.7–7.7)
Neutrophils Relative %: 54 %
Platelets: 275 10*3/uL (ref 150–400)
RBC: 4.53 MIL/uL (ref 3.87–5.11)
RDW: 17.4 % — ABNORMAL HIGH (ref 11.5–15.5)
WBC: 6.1 10*3/uL (ref 4.0–10.5)
nRBC: 0 % (ref 0.0–0.2)

## 2021-12-22 LAB — GLUCOSE, CAPILLARY
Glucose-Capillary: 162 mg/dL — ABNORMAL HIGH (ref 70–99)
Glucose-Capillary: 99 mg/dL (ref 70–99)
Glucose-Capillary: 142 mg/dL — ABNORMAL HIGH (ref 70–99)
Glucose-Capillary: 122 mg/dL — ABNORMAL HIGH (ref 70–99)
Glucose-Capillary: 178 mg/dL — ABNORMAL HIGH (ref 70–99)

## 2021-12-22 NOTE — ED Provider Notes (Signed)
Behavioral Health Progress Note  Date and Time: 12/22/2021 10:33 AM Name: Natoya Viscomi MRN:  [id number]  HPI: Katura Eatherly is a 61 y.o. female with a past psychiatric history of schizophrenia, aggressive behavior, and possible intellectual disability presenting to [facility] on 11/23/21 voluntarily as a walk in via [facility] with complaints that she was locked out of the group home. Patient has been dismissed from group home due to multiple elopements. Pt had already been discharged from this facility but had been living there temporarily while DSS was looking for new placement. Pt is boarding at [facility].  Completed an AID to Capacity Evaluation (ACE) with attending Dr. Dwyane Dee on 12/11/2021.  Please see media tab for full details.   Completed test with [name], PhD: Wechsler Adult Intelligence Scale-4, Ms. Creed achieved a full-scale IQ score of 73 and a percentile rank of 4 placing her in the borderline range of intellectual functioning (12/14/2021) Please see consult note from [name], PhD on 12/14/2021  Subjective:  Patient was initially seen this a.m. laying in bed awake, no acute discomfort.  Stated that her mood is much better than yesterday.  Denies somatic concerns per below.  Inquired again about dispo, stated that she would like to talk to CSW about the group houses.  Discussed with patient that if there is CSW in the building during the day, would request them to see patient if possible.  If not may have to wait until p.m. CSW.  Patient that she understood.  She had no other questions or concerns.  WE:[id number] Thoughts: No (Contracted to safety) PY:[id number] Thoughts: No TIW:[id number]: None Ideas of ZJQ:BHAL   Mood: Euthymic Sleep:Good Appetite: Good   Review of Systems  Constitutional:        Denied pain  HENT:  Negative for sore throat.   Respiratory:  Negative for shortness of breath.   Cardiovascular:  Negative for chest pain.  Gastrointestinal:   Negative for abdominal pain, constipation and diarrhea.  Neurological:  Negative for dizziness and headaches.    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 On the Wechsler Adult Intelligence Scale-4, Ms. Garr achieved a full-scale IQ score of 73 and a percentile rank of 4 placing her in the borderline range of intellectual functioning.    Chronic obstructive pulmonary disease (COPD) (HCC)    Glaucoma    Hyperlipidemia    Hypertension    Iron deficiency    Schizoaffective disorder (HCC)    Type 2 diabetes mellitus (HCC)     Past Surgical History:  Procedure Laterality Date   TUBAL LIGATION     Family History:  Family History  Problem Relation Age of Onset   Breast cancer Maternal Grandmother    Family Psychiatric  History: none reported Social History:  Social History   Substance and Sexual Activity  Alcohol Use Yes     Social History   Substance and Sexual Activity  Drug Use Not Currently    Social History   Socioeconomic History   Marital status: Divorced    Spouse name: Not on file   Number of children: Not on file   Years of education: Not on file  Highest education level: Not on file  Occupational History   Not on file  Tobacco Use   Smoking status: Every Day    Packs/day: 1.00    Types: Cigars, Cigarettes   Smokeless tobacco: Current  Vaping Use   Vaping Use: Never used  Substance and Sexual Activity   Alcohol use: Yes   Drug use: Not Currently   Sexual activity: Not Currently    Birth control/protection: Surgical  Other Topics Concern   Not on file   Social History Narrative   Not on file   Social Determinants of Health   Financial Resource Strain: Not on file  Food Insecurity: Not on file  Transportation Needs: Not on file  Physical Activity: Not on file  Stress: Not on file  Social Connections: Not on file   SDOH:  SDOH Screenings   Depression (PHQ2-9): Low Risk  (11/23/2021)  Tobacco Use: High Risk (12/14/2021)   Additional Social History:    Pain Medications: See MAR Prescriptions: See MAR Over the Counter: See MAR History of alcohol / drug use?: No history of alcohol / drug abuse Longest period of sobriety (when/how long): N/A                    Current Medications:  Current Facility-Administered Medications  Medication Dose Route Frequency Provider Last Rate Last Admin   acetaminophen (TYLENOL) tablet 650 mg  650 mg Oral Q6H PRN Rankin, Shuvon B, NP   650 mg at 12/16/21 2156   albuterol (VENTOLIN HFA) 108 (90 Base) MCG/ACT inhaler 2 puff  2 puff Inhalation Q6H PRN Rankin, Shuvon B, NP   2 puff at 12/09/21 2102   alum & mag hydroxide-simeth (MAALOX/MYLANTA) 200-200-20 MG/5ML suspension 30 mL  30 mL Oral Q4H PRN Rankin, Shuvon B, NP   30 mL at 12/19/21 2109   apixaban (ELIQUIS) tablet 5 mg  5 mg Oral BID Rankin, Shuvon B, NP   5 mg at 12/22/21 0921   cloZAPine (CLOZARIL) tablet 50 mg  50 mg Oral Daily [name], NP   50 mg at 12/22/21 0920   cloZAPine (CLOZARIL) tablet 75 mg  75 mg Oral QHS [name], NP   75 mg at 12/21/21 2125   diltiazem (CARDIZEM CD) 24 hr capsule 240 mg  240 mg Oral Daily Rankin, Shuvon B, NP   240 mg at 12/22/21 0920   divalproex (DEPAKOTE ER) 24 hr tablet 500 mg  500 mg Oral BID Rankin, Shuvon B, NP   500 mg at 12/22/21 0920   fluticasone furoate-vilanterol (BREO ELLIPTA) 200-25 MCG/ACT 1 puff  1 puff Inhalation Daily Rankin, Shuvon B, NP   1 puff at 12/22/21 0752   haloperidol (HALDOL) tablet 10 mg  10 mg Oral Q8H PRN Rankin, Shuvon B, NP       insulin aspart (novoLOG)  injection 0-9 Units  0-9 Units Subcutaneous TID WC Tharon Aquas, NP   2 Units at 12/21/21 1627   insulin glargine-yfgn (SEMGLEE) injection 10 Units  10 Units Subcutaneous Daily [name], DO   10 Units at 12/22/21 0921   latanoprost (XALATAN) 0.005 % ophthalmic solution 1 drop  1 drop Both Eyes QHS Rankin, Shuvon B, NP   1 drop at 12/21/21 2125   magnesium hydroxide (MILK OF MAGNESIA) suspension 30 mL  30 mL Oral Daily PRN Rankin, Shuvon B, NP       menthol-cetylpyridinium (CEPACOL) lozenge 3 mg  1 lozenge Oral Once [name], NP  menthol-cetylpyridinium (CEPACOL) lozenge 3 mg  1 lozenge Oral PRN [name], NP   3 mg at 12/21/21 2243   metFORMIN (GLUCOPHAGE-XR) 24 hr tablet 1,000 mg  1,000 mg Oral BID WC [name], DO   1,000 mg at 12/22/21 0630   nicotine (NICODERM CQ - dosed in mg/24 hr) patch 7 mg  7 mg Transdermal Daily Rankin, Shuvon B, NP   7 mg at 12/22/21 0920   Vitamin D (Ergocalciferol) (DRISDOL) 1.25 MG (50000 UNIT) capsule 50,000 Units  50,000 Units Oral Q7 days Rankin, Shuvon B, NP   50,000 Units at 12/17/21 0930   Current Outpatient Medications  Medication Sig Dispense Refill   Accu-Chek Softclix Lancets lancets Use as directed up to four times daily 100 each 0   apixaban (ELIQUIS) 5 MG TABS tablet Take 1 tablet (5 mg total) by mouth 2 (two) times daily. 60 tablet 0   ARIPiprazole (ABILIFY) 10 MG tablet Take 1 tablet (10 mg total) by mouth daily. 30 tablet 0   Blood Glucose Monitoring Suppl (ACCU-CHEK GUIDE) w/Device KIT Use as directed up to four times daily 1 kit 0   budesonide-formoterol (SYMBICORT) 160-4.5 MCG/ACT inhaler Inhale 2 puffs into the lungs in the morning and at bedtime.     Cholecalciferol (VITAMIN D3) 1.25 MG (50000 UT) CAPS Take 50,000 Units by mouth every Thursday.     cloZAPine (CLOZARIL) 25 MG tablet Take 3 tablets (75 mg total) by mouth at bedtime. 90 tablet 0   clozapine (CLOZARIL) 50 MG tablet Take 1 tablet (50 mg total) by mouth  daily. 30 tablet 0   diltiazem (CARDIZEM CD) 240 MG 24 hr capsule Take 1 capsule (240 mg total) by mouth daily. (Patient not taking: Reported on 11/24/2021) 30 capsule 0   divalproex (DEPAKOTE ER) 500 MG 24 hr tablet Take 1 tablet (500 mg total) by mouth 2 (two) times daily. 60 tablet 0   glucose blood test strip Use as directed up to four times daily 50 each 0   haloperidol (HALDOL) 10 MG tablet Take 1 tablet (10 mg total) by mouth 3 (three) times daily as needed for agitation (and psychotic symptoms).     INGREZZA 40 MG capsule Take 1 capsule (40 mg total) by mouth in the morning. 30 capsule 0   insulin aspart (NOVOLOG) 100 UNIT/ML FlexPen Before each meal 3 times a day, 140-199 - 2 units, 200-250 - 4 units, 251-299 - 6 units,  300-349 - 8 units,  350 or above 10 units. Insulin PEN if approved, provide syringes and needles if needed.Please switch to any approved short acting Insulin if needed. 15 mL 0   insulin glargine (LANTUS) 100 UNIT/ML Solostar Pen Inject 12 Units into the skin daily. 15 mL 0   Insulin Pen Needle 32G X 4 MM MISC Use 4 times a day with insulin, 1 month supply. 100 each 0   latanoprost (XALATAN) 0.005 % ophthalmic solution Place 1 drop into both eyes at bedtime.     metFORMIN (GLUCOPHAGE) 500 MG tablet Take 500 mg by mouth 2 (two) times daily with a meal.     nicotine (NICODERM CQ - DOSED IN MG/24 HR) 7 mg/24hr patch Place 1 patch (7 mg total) onto the skin daily. 28 patch 0   PROAIR HFA 108 (90 Base) MCG/ACT inhaler Inhale 2 puffs into the lungs every 6 (six) hours as needed for wheezing or shortness of breath.      Labs  Lab Results:  No results displayed because  visit has over 200 results.    Admission on 11/22/2021, Discharged on 11/22/2021  Component Date Value Ref Range Status   Glucose-Capillary 11/22/2021 169 (H)  70 - 99 mg/dL Final   Glucose reference range applies only to samples taken after fasting for at least 8 hours.  No results displayed because visit has  over 200 results.    Admission on 10/04/2021, Discharged on 10/22/2021  Component Date Value Ref Range Status   SARS Coronavirus 2 by RT PCR 10/04/2021 NEGATIVE  NEGATIVE Final   Comment: (NOTE) SARS-CoV-2 target nucleic acids are NOT DETECTED.  The SARS-CoV-2 RNA is generally detectable in upper respiratory specimens during the acute phase of infection. The lowest concentration of SARS-CoV-2 viral copies this assay can detect is 138 copies/mL. A negative result does not preclude SARS-Cov-2 infection and should not be used as the sole basis for treatment or other patient management decisions. A negative result may occur with  improper specimen collection/handling, submission of specimen other than nasopharyngeal swab, presence of viral mutation(s) within the areas targeted by this assay, and inadequate number of viral copies(<138 copies/mL). A negative result must be combined with clinical observations, patient history, and epidemiological information. The expected result is Negative.  Fact Sheet for Patients:  EntrepreneurPulse.com.au  Fact Sheet for Healthcare Providers:  IncredibleEmployment.be  This test is no                          t yet approved or cleared by the [place] FDA and  has been authorized for detection and/or diagnosis of SARS-CoV-2 by FDA under an Emergency Use Authorization (EUA). This EUA will remain  in effect (meaning this test can be used) for the duration of the COVID-19 declaration under Section 564(b)(1) of the Act, 21 U.S.C.section 360bbb-3(b)(1), unless the authorization is terminated  or revoked sooner.       Influenza A by PCR 10/04/2021 NEGATIVE  NEGATIVE Final   Influenza B by PCR 10/04/2021 NEGATIVE  NEGATIVE Final   Comment: (NOTE) The Xpert Xpress SARS-CoV-2/FLU/RSV plus assay is intended as an aid in the diagnosis of influenza from Nasopharyngeal swab specimens and should not be used as a sole  basis for treatment. Nasal washings and aspirates are unacceptable for Xpert Xpress SARS-CoV-2/FLU/RSV testing.  Fact Sheet for Patients: EntrepreneurPulse.com.au  Fact Sheet for Healthcare Providers: IncredibleEmployment.be  This test is not yet approved or cleared by the [place] FDA and has been authorized for detection and/or diagnosis of SARS-CoV-2 by FDA under an Emergency Use Authorization (EUA). This EUA will remain in effect (meaning this test can be used) for the duration of the COVID-19 declaration under Section 564(b)(1) of the Act, 21 U.S.C. section 360bbb-3(b)(1), unless the authorization is terminated or revoked.  Performed at [facility], [place] [address], [place], [place] [address]    WBC 10/04/2021 8.4  4.0 - 10.5 K/uL Final   RBC 10/04/2021 4.42  3.87 - 5.11 MIL/uL Final   Hemoglobin 10/04/2021 11.6 (L)  12.0 - 15.0 g/dL Final   HCT 10/04/2021 35.7 (L)  36.0 - 46.0 % Final   MCV 10/04/2021 80.8  80.0 - 100.0 fL Final   MCH 10/04/2021 26.2  26.0 - 34.0 pg Final   MCHC 10/04/2021 32.5  30.0 - 36.0 g/dL Final   RDW 10/04/2021 16.0 (H)  11.5 - 15.5 % Final   Platelets 10/04/2021 372  150 - 400 K/uL Final   nRBC 10/04/2021 0.0  0.0 -  0.2 % Final   Neutrophils Relative % 10/04/2021 68  % Final   Neutro Abs 10/04/2021 5.7  1.7 - 7.7 K/uL Final   Lymphocytes Relative 10/04/2021 22  % Final   Lymphs Abs 10/04/2021 1.8  0.7 - 4.0 K/uL Final   Monocytes Relative 10/04/2021 8  % Final   Monocytes Absolute 10/04/2021 0.7  0.1 - 1.0 K/uL Final   Eosinophils Relative 10/04/2021 1  % Final   Eosinophils Absolute 10/04/2021 0.1  0.0 - 0.5 K/uL Final   Basophils Relative 10/04/2021 1  % Final   Basophils Absolute 10/04/2021 0.1  0.0 - 0.1 K/uL Final   Immature Granulocytes 10/04/2021 0  % Final   Abs Immature Granulocytes 10/04/2021 0.03  0.00 - 0.07 K/uL Final   Performed at [facility], [place] [address] [name],  [place] [address]   Sodium 10/04/2021 136  135 - 145 mmol/L Final   Potassium 10/04/2021 4.2  3.5 - 5.1 mmol/L Final   Chloride 10/04/2021 104  98 - 111 mmol/L Final   CO2 10/04/2021 25  22 - 32 mmol/L Final   Glucose, Bld 10/04/2021 100 (H)  70 - 99 mg/dL Final   Glucose reference range applies only to samples taken after fasting for at least 8 hours.   BUN 10/04/2021 9  6 - 20 mg/dL Final   Creatinine, Ser 10/04/2021 0.52  0.44 - 1.00 mg/dL Final   Calcium 10/04/2021 9.0  8.9 - 10.3 mg/dL Final   Total Protein 10/04/2021 7.0  6.5 - 8.1 g/dL Final   Albumin 10/04/2021 3.2 (L)  3.5 - 5.0 g/dL Final   AST 10/04/2021 13 (L)  15 - 41 U/L Final   ALT 10/04/2021 10  0 - 44 U/L Final   Alkaline Phosphatase 10/04/2021 61  38 - 126 U/L Final   Total Bilirubin 10/04/2021 0.3  0.3 - 1.2 mg/dL Final   GFR, Estimated 10/04/2021 >60  >60 mL/min Final   Comment: (NOTE) Calculated using the CKD-EPI Creatinine Equation (2021)    Anion gap 10/04/2021 7  5 - 15 Final   Performed at [facility] [address], [place], [place] [address]   Hgb A1c MFr Bld 10/04/2021 7.0 (H)  4.8 - 5.6 % Final   Comment: (NOTE) Pre diabetes:          5.7%-6.4%  Diabetes:              >6.4%  Glycemic control for   <7.0% adults with diabetes    Mean Plasma Glucose 10/04/2021 154.2  mg/dL Final   Performed at [facility], [place] [address], [place], [place] [address]   Cholesterol 10/04/2021 178  0 - 200 mg/dL Final   Triglycerides 10/04/2021 155 (H)  <150 mg/dL Final   HDL 10/04/2021 52  >40 mg/dL Final   Total CHOL/HDL Ratio 10/04/2021 3.4  RATIO Final   VLDL 10/04/2021 31  0 - 40 mg/dL Final   LDL Cholesterol 10/04/2021 95  0 - 99 mg/dL Final   Comment:        Total Cholesterol/HDL:CHD Risk Coronary Heart Disease Risk Table                     Men   Women  1/2 Average Risk   3.4   3.3  Average Risk       5.0   4.4  2 X Average Risk   9.6   7.1  3 X Average  Risk  23.4   11.0        Use the calculated  Patient Ratio above and the CHD Risk Table to determine the patient's CHD Risk.        ATP III CLASSIFICATION (LDL):  <100     mg/dL   Optimal  100-129  mg/dL   Near or Above                    Optimal  130-159  mg/dL   Borderline  160-189  mg/dL   High  >190     mg/dL   Very High Performed at [facility] [address], [place], [place] [address]    POC Amphetamine UR 10/04/2021 None Detected  NONE DETECTED (Cut Off Level 1000 ng/mL) Final   POC Secobarbital (BAR) 10/04/2021 None Detected  NONE DETECTED (Cut Off Level 300 ng/mL) Final   POC Buprenorphine (BUP) 10/04/2021 None Detected  NONE DETECTED (Cut Off Level 10 ng/mL) Final   POC Oxazepam (BZO) 10/04/2021 None Detected  NONE DETECTED (Cut Off Level 300 ng/mL) Final   POC Cocaine UR 10/04/2021 None Detected  NONE DETECTED (Cut Off Level 300 ng/mL) Final   POC Methamphetamine UR 10/04/2021 None Detected  NONE DETECTED (Cut Off Level 1000 ng/mL) Final   POC Morphine 10/04/2021 None Detected  NONE DETECTED (Cut Off Level 300 ng/mL) Final   POC Methadone UR 10/04/2021 None Detected  NONE DETECTED (Cut Off Level 300 ng/mL) Final   POC Oxycodone UR 10/04/2021 Positive (A)  NONE DETECTED (Cut Off Level 100 ng/mL) Final   POC Marijuana UR 10/04/2021 None Detected  NONE DETECTED (Cut Off Level 50 ng/mL) Final   SARSCOV2ONAVIRUS 2 AG 10/04/2021 NEGATIVE  NEGATIVE Final   Comment: (NOTE) SARS-CoV-2 antigen NOT DETECTED.   Negative results are presumptive.  Negative results do not preclude SARS-CoV-2 infection and should not be used as the sole basis for treatment or other patient management decisions, including infection  control decisions, particularly in the presence of clinical signs and  symptoms consistent with COVID-19, or in those who have been in contact with the virus.  Negative results must be combined with clinical observations, patient history, and epidemiological information. The expected result is Negative.  Fact  Sheet for Patients: HandmadeRecipes.com.cy  Fact Sheet for Healthcare Providers: FuneralLife.at  This test is not yet approved or cleared by the [place] FDA and  has been authorized for detection and/or diagnosis of SARS-CoV-2 by FDA under an Emergency Use Authorization (EUA).  This EUA will remain in effect (meaning this test can be used) for the duration of  the COV                          ID-19 declaration under Section 564(b)(1) of the Act, 21 U.S.C. section 360bbb-3(b)(1), unless the authorization is terminated or revoked sooner.     Valproic Acid Lvl 10/04/2021 51  50.0 - 100.0 ug/mL Final   Performed at [facility] [address], [place], [place] [address]   Valproic Acid Lvl 10/08/2021 57  50.0 - 100.0 ug/mL Final   Performed at [facility] [address], [place], [place] [address]   Glucose-Capillary 10/09/2021 123 (H)  70 - 99 mg/dL Final   Glucose reference range applies only to samples taken after fasting for at least 8 hours.  Admission on 09/09/2021, Discharged on 09/10/2021  Component Date Value Ref Range Status  Sodium 09/09/2021 134 (L)  135 - 145 mmol/L Final   Potassium 09/09/2021 4.3  3.5 - 5.1 mmol/L Final   Chloride 09/09/2021 99  98 - 111 mmol/L Final   CO2 09/09/2021 25  22 - 32 mmol/L Final   Glucose, Bld 09/09/2021 107 (H)  70 - 99 mg/dL Final   Glucose reference range applies only to samples taken after fasting for at least 8 hours.   BUN 09/09/2021 12  6 - 20 mg/dL Final   Creatinine, Ser 09/09/2021 0.74  0.44 - 1.00 mg/dL Final   Calcium 09/09/2021 9.0  8.9 - 10.3 mg/dL Final   Total Protein 09/09/2021 7.4  6.5 - 8.1 g/dL Final   Albumin 09/09/2021 3.1 (L)  3.5 - 5.0 g/dL Final   AST 09/09/2021 13 (L)  15 - 41 U/L Final   ALT 09/09/2021 13  0 - 44 U/L Final   Alkaline Phosphatase 09/09/2021 66  38 - 126 U/L Final   Total Bilirubin 09/09/2021 0.2 (L)  0.3 - 1.2 mg/dL Final   GFR,  Estimated 09/09/2021 >60  >60 mL/min Final   Comment: (NOTE) Calculated using the CKD-EPI Creatinine Equation (2021)    Anion gap 09/09/2021 10  5 - 15 Final   Performed at [facility], [place] [address] [place], [place] [address]   WBC 09/09/2021 8.5  4.0 - 10.5 K/uL Final   RBC 09/09/2021 4.53  3.87 - 5.11 MIL/uL Final   Hemoglobin 09/09/2021 11.8 (L)  12.0 - 15.0 g/dL Final   HCT 09/09/2021 38.0  36.0 - 46.0 % Final   MCV 09/09/2021 83.9  80.0 - 100.0 fL Final   MCH 09/09/2021 26.0  26.0 - 34.0 pg Final   MCHC 09/09/2021 31.1  30.0 - 36.0 g/dL Final   RDW 09/09/2021 17.8 (H)  11.5 - 15.5 % Final   Platelets 09/09/2021 442 (H)  150 - 400 K/uL Final   nRBC 09/09/2021 0.0  0.0 - 0.2 % Final   Neutrophils Relative % 09/09/2021 70  % Final   Neutro Abs 09/09/2021 5.9  1.7 - 7.7 K/uL Final   Lymphocytes Relative 09/09/2021 20  % Final   Lymphs Abs 09/09/2021 1.7  0.7 - 4.0 K/uL Final   Monocytes Relative 09/09/2021 8  % Final   Monocytes Absolute 09/09/2021 0.7  0.1 - 1.0 K/uL Final   Eosinophils Relative 09/09/2021 1  % Final   Eosinophils Absolute 09/09/2021 0.1  0.0 - 0.5 K/uL Final   Basophils Relative 09/09/2021 1  % Final   Basophils Absolute 09/09/2021 0.1  0.0 - 0.1 K/uL Final   Immature Granulocytes 09/09/2021 0  % Final   Abs Immature Granulocytes 09/09/2021 0.03  0.00 - 0.07 K/uL Final   Performed at [facility], [place] [address], [place], [place] [address]   Ammonia 09/09/2021 37 (H)  9 - 35 umol/L Final   Comment: HEMOLYSIS AT THIS LEVEL MAY AFFECT RESULT Performed at [facility], [place] [address], [place], [place] [address]    Opiates 09/09/2021 NONE DETECTED  NONE DETECTED Final   Cocaine 09/09/2021 NONE DETECTED  NONE DETECTED Final   Benzodiazepines 09/09/2021 NONE DETECTED  NONE DETECTED Final   Amphetamines 09/09/2021 NONE DETECTED  NONE DETECTED Final   Tetrahydrocannabinol 09/09/2021 NONE DETECTED  NONE DETECTED Final   Barbiturates 09/09/2021  NONE DETECTED  NONE DETECTED Final   Comment: (NOTE) DRUG SCREEN FOR MEDICAL PURPOSES ONLY.  IF CONFIRMATION IS NEEDED FOR ANY PURPOSE, NOTIFY  LAB WITHIN 5 DAYS.  LOWEST DETECTABLE LIMITS FOR URINE DRUG SCREEN Drug Class                     Cutoff (ng/mL) Amphetamine and metabolites    1000 Barbiturate and metabolites    200 Benzodiazepine                 092 Tricyclics and metabolites     300 Opiates and metabolites        300 Cocaine and metabolites        300 THC                            50 Performed at [facility], [place] [address] [place], [place] [address]    Alcohol, Ethyl (B) 09/09/2021 <10  <10 mg/dL Final   Comment: (NOTE) Lowest detectable limit for serum alcohol is 10 mg/dL.  For medical purposes only. Performed at [facility], [place] [address], [place], [place] [address]    Lipase 09/09/2021 33  11 - 51 U/L Final   Performed at [facility] [address], [place], [place] [address]   Color, Urine 09/09/2021 COLORLESS (A)  YELLOW Final   APPearance 09/09/2021 CLEAR  CLEAR Final   Specific Gravity, Urine 09/09/2021 1.002 (L)  1.005 - 1.030 Final   pH 09/09/2021 6.0  5.0 - 8.0 Final   Glucose, UA 09/09/2021 NEGATIVE  NEGATIVE mg/dL Final   Hgb urine dipstick 09/09/2021 NEGATIVE  NEGATIVE Final   Bilirubin Urine 09/09/2021 NEGATIVE  NEGATIVE Final   Ketones, ur 09/09/2021 NEGATIVE  NEGATIVE mg/dL Final   Protein, ur 09/09/2021 NEGATIVE  NEGATIVE mg/dL Final   Nitrite 09/09/2021 NEGATIVE  NEGATIVE Final   Leukocytes,Ua 09/09/2021 NEGATIVE  NEGATIVE Final   Performed at [facility] [address] [place], [place] [address]   Specimen Description 09/09/2021 URINE, CLEAN CATCH   Final   Special Requests 09/09/2021 NONE   Final   Culture 09/09/2021  (A)   Final                   Value:<10,000 COLONIES/mL INSIGNIFICANT GROWTH Performed at [facility], [place] [address], [place], [place] [address]    Report Status 09/09/2021  09/10/2021 FINAL   Final   D-Dimer, Quant 09/09/2021 0.92 (H)  0.00 - 0.50 ug/mL-FEU Final   Comment: (NOTE) At the manufacturer cut-off value of 0.5 g/mL FEU, this assay has a negative predictive value of 95-100%.This assay is intended for use in conjunction with a clinical pretest probability (PTP) assessment model to exclude pulmonary embolism (PE) and deep venous thrombosis (DVT) in outpatients suspected of PE or DVT. Results should be correlated with clinical presentation. Performed at [facility], [place] [address], [place], [place] [address]    Troponin I (High Sensitivity) 09/09/2021 5  <18 ng/L Final   Comment: (NOTE) Elevated high sensitivity troponin I (hsTnI) values and significant  changes across serial measurements may suggest ACS but many other  chronic and acute conditions are known to elevate hsTnI results.  Refer to the "Links" section for chest pain algorithms and additional  guidance. Performed at [facility], [place] [address], [place], [place] [id number]    BP 09/10/2021 135/83  mmHg Final   S' Lateral 09/10/2021 2.30  cm Final   Area-P 1/2 09/10/2021 5.58  cm2 Final    Blood  Alcohol level:  Lab Results  Component Value Date   ETH <10 11/23/2021   ETH <10 44/31/5400    Metabolic Disorder Labs: Lab Results  Component Value Date   HGBA1C 7.0 (H) 10/04/2021   MPG 154.2 10/04/2021   Lab Results  Component Value Date   PROLACTIN 3.8 (L) 11/23/2021   Lab Results  Component Value Date   CHOL 171 11/23/2021   TRIG 125 11/23/2021   HDL 65 11/23/2021   CHOLHDL 2.6 11/23/2021   VLDL 25 11/23/2021   LDLCALC 81 11/23/2021   LDLCALC 95 10/04/2021    Therapeutic Lab Levels: No results found for: "LITHIUM" Lab Results  Component Value Date   VALPROATE 54 12/03/2021   VALPROATE 45 (L) 11/09/2021   No results found for: "CBMZ"  Physical Findings   PHQ2-9    [place] [facility] from 11/23/2021 in [facility]   PHQ-2 Total Score 2  PHQ-9 Total Score 3      Flowsheet Row ED from 11/23/2021 in [place] Most recent reading at 11/23/2021  6:44 PM ED from 11/22/2021 in [place] [facility] Most recent reading at 11/22/2021  8:28 PM ED from 11/22/2021 in [place] DE[facility] Most recent reading at 11/22/2021  1:24 AM  C-SSRS RISK CATEGORY No Risk No Risk No Risk        Musculoskeletal  Strength & Muscle Tone: within normal limits Gait & Station: normal Patient leans: N/A  Psychiatric Specialty Exam  Presentation  General Appearance:  Appropriate for Environment; Casual; Fairly Groomed  Eye Contact: Good  Speech: Clear and Coherent; Normal Rate  Speech Volume: Normal  Handedness: Left  Mood and Affect  Mood: Euthymic  Affect: Appropriate; Congruent; Full Range   Thought Process  Thought Processes: Coherent; Goal Directed; Linear  Descriptions of Associations:Intact  Orientation:Full (Time, Place and Person)  Thought Content:WDL (Patient was to go home, suggested looking at more group homes)  Diagnosis of Schizophrenia or Schizoaffective disorder in past: Yes  Hallucinations:Hallucinations: None    Ideas of Reference:None  Suicidal Thoughts:Suicidal Thoughts: No (Contracted to safety)    Homicidal Thoughts:Homicidal Thoughts: No    Sensorium  Memory: Immediate Good  Judgment: Fair  Insight: Fair   Community education officer  Concentration: Good  Attention Span: Good  Recall: Good  Fund of Knowledge: Good  Language: Good   Psychomotor Activity  Psychomotor Activity: Psychomotor Activity: Normal   Assets  Assets: Communication Skills; Desire for Improvement   Sleep  Sleep: Sleep: Good    Physical Exam  Physical Exam Vitals and nursing note reviewed.  Constitutional:      General: She is not in acute distress.    Appearance: She is not ill-appearing  or diaphoretic.  HENT:     Head: Normocephalic and atraumatic.  Pulmonary:     Effort: Pulmonary effort is normal. No respiratory distress.  Neurological:     General: No focal deficit present.     Mental Status: She is alert.    Blood pressure 127/85, pulse 78, temperature (!) 97.5 F (36.4 C), temperature source Oral, resp. rate 18, SpO2 100 %. There is no height or weight on file to calculate BMI.  Treatment Plan Summary: B[place] remains cleared by psychiatry.   Facility [facility] team along with [facility] health care manager and case manager and Adult [employer] representative continue to seek placement for [place].  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 apixaban, 5 mg, BID clozapine, 50 mg, Daily cloZAPine, 75 mg, QHS diltiazem, 240 mg, Daily divalproex, 500 mg, BID fluticasone furoate-vilanterol, 1 puff, Daily insulin aspart, 0-9 Units, TID WC insulin glargine-yfgn, 10 Units, Daily latanoprost, 1 drop, QHS menthol-cetylpyridinium, 1 lozenge, Once metFORMIN, 1,000 mg, BID WC nicotine, 7 mg, Daily Vitamin D (Ergocalciferol), 50,000 Units, Q7 days    [name], DO 12/22/2021 10:33 AM

## 2021-12-22 NOTE — ED Notes (Signed)
[facility]site [facility] called to collect STAT specimen and to be delivered to [facility].

## 2021-12-22 NOTE — ED Notes (Signed)
Pt denied SI/HI/AVH. Accepted scheduled PO meds, glucose checks and insulin per orders, tolerated all well. Pt currently sitting on the pull out chair/bed in no distress. Pt makes her needs known to staff. Pt converse with staff and other patients. Safety maintained and will continue to monitor.

## 2021-12-22 NOTE — ED Notes (Signed)
Pt is A & O x 4. Pt is siting on the stretcher watching TV. Pt  denies SI/HI/AVH. Will continue to monitor.

## 2021-12-22 NOTE — Progress Notes (Signed)
A DSS Team Decision Making (TDM) meeting was scheduled today regarding a petition for guardianship. Placement efforts continue with 139 referrals made to date. Reportedly the DSS case coordinator is awaiting call backs from several placement facilities and will follow-up with [facility] staff once more information is available.

## 2021-12-22 NOTE — Care Management (Signed)
Care Management   [facility] Allegra Lai) reports that she is willing to accept the patient pending the receipt of the following items: FL-2  List of current medication  Current psychological evaluation   Per [place] will apply for guardianship in [facility]   Per [place] will also apply to become the patient payee.   Writer informed the NP working with the patient.

## 2021-12-22 NOTE — ED Notes (Signed)
Pt sleeping at present, no distress noted.  Monitoring for safety. 

## 2021-12-22 NOTE — Progress Notes (Deleted)
[facility] BH Supervisor participated on 2x a week DSS call and received patient update. Possible placement in [place] supervisor Kevin Fenton is awaiting on funding determination should patient be accepted into the program in [place], [place] [facility]).  A DSS Team Decision Making (TDM) meeting was scheduled today regarding a petition for guardianship. Placement efforts continue with 139 referrals made to date. Reportedly the DSS case coordinator is awaiting call backs from several placement facilities and will follow-up with [facility] staff once more information is available.

## 2021-12-22 NOTE — Care Management (Signed)
Care Management   Patient will be meeting with Beckie Salts today at 3pm.    Writer coordinated with Beckie Salts [phone])  so that he is able to come today and meet with the client face to face so that she can sign the consent forms in order to receive services in the supported housing program.   Writer left a voice mail message with the owner [name] [phone] and followed up with an email in order to obtain a status update on the referral for placement to her facility.

## 2021-12-22 NOTE — Progress Notes (Signed)
Brief note:  [facility]    10/24 [place] = 3200 ANC monitoring frequency:  weekly    Next ANC due 10/31  Thanks!  [name] 12/22/2021 10:46 AM

## 2021-12-23 DIAGNOSIS — F200 Paranoid schizophrenia: Secondary | ICD-10-CM | POA: Diagnosis not present

## 2021-12-23 LAB — GLUCOSE, CAPILLARY
Glucose-Capillary: 102 mg/dL — ABNORMAL HIGH (ref 70–99)
Glucose-Capillary: 123 mg/dL — ABNORMAL HIGH (ref 70–99)
Glucose-Capillary: 151 mg/dL — ABNORMAL HIGH (ref 70–99)
Glucose-Capillary: 152 mg/dL — ABNORMAL HIGH (ref 70–99)

## 2021-12-23 NOTE — ED Notes (Signed)
Pt sleeping at present, no distress noted, monitoring for safety. 

## 2021-12-23 NOTE — Care Management (Signed)
Care Management   Writer spoke to the owner of the [facility]).    [name] reports that her facility is not able to give insulin injections and will now need to rescind the offer of a group home placement.    Writer informed [name] with [facility] that the patient offer for placement has been rescinded.   Writer informed the NP and the RN working with the patient that offer for placement has been rescinded.   Per, [name] with [facility], she will continue to look for alternative placement for the patient.

## 2021-12-23 NOTE — ED Provider Notes (Signed)
Behavioral Health Progress Note  Date and Time: 12/23/2021 9:50 AM Name: Teresa Murillo MRN:  [id number]  HPI: Teresa Murillo is a 61 y.o. female with a past psychiatric history of schizophrenia, aggressive behavior, and possible intellectual disability presenting to [facility] on 11/23/21 voluntarily as a walk in via [facility] with complaints that she was locked out of the group home. Patient has been dismissed from group home due to multiple elopements. Pt had already been discharged from this facility but had been living there temporarily while DSS was looking for new placement. Pt is boarding at [facility].  Completed an AID to Capacity Evaluation (ACE) with attending Dr. Dwyane Dee on 12/11/2021.  Please see media tab for full details.   Completed test with [name], PhD: Wechsler Adult Intelligence Scale-4, Teresa Murillo achieved a full-scale IQ score of 73 and a percentile rank of 4 placing her in the borderline range of intellectual functioning (12/14/2021) Please see consult note from [name], PhD on 12/14/2021  Subjective:  Patient was initially seen this a.m. alseep, awoken easily.  Discussed with patient that a group home has been found for her, however paperwork is still needed before she is discharged.  She had no other questions or concerns.   JK:[id number] Thoughts: No (Contracted to safety) HB:[id number] Thoughts: No YBO:FBPZWCHENIDPOE: None Ideas of UMP:NTIR   Mood: Euthymic Sleep:Good Appetite: Good   Review of Systems  Respiratory:  Negative for shortness of breath.   Cardiovascular:  Negative for chest pain.  Gastrointestinal:  Negative for abdominal pain and constipation.  Neurological:  Negative for headaches.    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 On the Wechsler Adult Intelligence Scale-4, Teresa Murillo achieved a full-scale IQ score of 73 and a percentile rank of 4 placing her in the borderline range of intellectual functioning.    Chronic obstructive pulmonary disease (COPD) (HCC)    Glaucoma    Hyperlipidemia    Hypertension    Iron deficiency    Schizoaffective disorder (HCC)    Type 2 diabetes mellitus (HCC)     Past Surgical History:  Procedure Laterality Date   TUBAL LIGATION     Family History:  Family History  Problem Relation Age of Onset   Breast cancer Maternal Grandmother    Family Psychiatric  History: none reported Social History:  Social History   Substance and Sexual Activity  Alcohol Use Yes     Social History   Substance and Sexual Activity  Drug Use Not Currently    Social History   Socioeconomic History   Marital status: Divorced    Spouse name: Not on file   Number of children: Not on file   Years of education: Not on file   Highest education level: Not on file  Occupational History   Not on file  Tobacco Use   Smoking status: Every Day    Packs/day: 1.00    Types: Cigars, Cigarettes   Smokeless tobacco: Current  Vaping Use   Vaping Use: Never used  Substance and Sexual Activity   Alcohol use: Yes   Drug use: Not Currently   Sexual activity: Not Currently    Birth  control/protection: Surgical  Other Topics Concern   Not on file  Social History Narrative   Not on file   Social Determinants of Health   Financial Resource Strain: Not on file  Food Insecurity: Not on file  Transportation Needs: Not on file  Physical Activity: Not on file  Stress: Not on file  Social Connections: Not on file   SDOH:  SDOH Screenings   Depression (PHQ2-9): Low Risk  (11/23/2021)  Tobacco  Use: High Risk (12/14/2021)   Additional Social History:    Pain Medications: See MAR Prescriptions: See MAR Over the Counter: See MAR History of alcohol / drug use?: No history of alcohol / drug abuse Longest period of sobriety (when/how long): N/A                    Current Medications:  Current Facility-Administered Medications  Medication Dose Route Frequency Provider Last Rate Last Admin   acetaminophen (TYLENOL) tablet 650 mg  650 mg Oral Q6H PRN Rankin, Shuvon B, NP   650 mg at 12/16/21 2156   albuterol (VENTOLIN HFA) 108 (90 Base) MCG/ACT inhaler 2 puff  2 puff Inhalation Q6H PRN Rankin, Shuvon B, NP   2 puff at 12/09/21 2102   alum & mag hydroxide-simeth (MAALOX/MYLANTA) 200-200-20 MG/5ML suspension 30 mL  30 mL Oral Q4H PRN Rankin, Shuvon B, NP   30 mL at 12/19/21 2109   apixaban (ELIQUIS) tablet 5 mg  5 mg Oral BID Rankin, Shuvon B, NP   5 mg at 12/23/21 0912   cloZAPine (CLOZARIL) tablet 50 mg  50 mg Oral Daily [name], NP   50 mg at 12/23/21 0912   cloZAPine (CLOZARIL) tablet 75 mg  75 mg Oral QHS [name], NP   75 mg at 12/22/21 2118   diltiazem (CARDIZEM CD) 24 hr capsule 240 mg  240 mg Oral Daily Rankin, Shuvon B, NP   240 mg at 12/23/21 0912   divalproex (DEPAKOTE ER) 24 hr tablet 500 mg  500 mg Oral BID Rankin, Shuvon B, NP   500 mg at 12/23/21 0913   fluticasone furoate-vilanterol (BREO ELLIPTA) 200-25 MCG/ACT 1 puff  1 puff Inhalation Daily Rankin, Shuvon B, NP   1 puff at 12/23/21 0914   haloperidol (HALDOL) tablet 10 mg  10 mg Oral Q8H PRN Rankin, Shuvon B, NP       insulin aspart (novoLOG) injection 0-9 Units  0-9 Units Subcutaneous TID WC Tharon Aquas, NP   1 Units at 12/22/21 1625   insulin glargine-yfgn (SEMGLEE) injection 10 Units  10 Units Subcutaneous Daily [name], DO   10 Units at 12/23/21 0913   latanoprost (XALATAN) 0.005 % ophthalmic solution 1 drop  1 drop Both Eyes QHS Rankin, Shuvon B, NP   1 drop at 12/22/21 2131    magnesium hydroxide (MILK OF MAGNESIA) suspension 30 mL  30 mL Oral Daily PRN Rankin, Shuvon B, NP       menthol-cetylpyridinium (CEPACOL) lozenge 3 mg  1 lozenge Oral Once [name], NP       menthol-cetylpyridinium (CEPACOL) lozenge 3 mg  1 lozenge Oral PRN [name], NP   3 mg at 12/21/21 2243   metFORMIN (GLUCOPHAGE-XR) 24 hr tablet 1,000 mg  1,000 mg Oral BID WC [name], DO   1,000 mg at 12/23/21 0454   nicotine (NICODERM CQ - dosed in mg/24 hr) patch 7 mg  7 mg Transdermal Daily Rankin, Shuvon B, NP   7 mg at  12/23/21 0914   Vitamin D (Ergocalciferol) (DRISDOL) 1.25 MG (50000 UNIT) capsule 50,000 Units  50,000 Units Oral Q7 days Rankin, Shuvon B, NP   50,000 Units at 12/17/21 0930   Current Outpatient Medications  Medication Sig Dispense Refill   Accu-Chek Softclix Lancets lancets Use as directed up to four times daily 100 each 0   apixaban (ELIQUIS) 5 MG TABS tablet Take 1 tablet (5 mg total) by mouth 2 (two) times daily. 60 tablet 0   ARIPiprazole (ABILIFY) 10 MG tablet Take 1 tablet (10 mg total) by mouth daily. 30 tablet 0   Blood Glucose Monitoring Suppl (ACCU-CHEK GUIDE) w/Device KIT Use as directed up to four times daily 1 kit 0   budesonide-formoterol (SYMBICORT) 160-4.5 MCG/ACT inhaler Inhale 2 puffs into the lungs in the morning and at bedtime.     Cholecalciferol (VITAMIN D3) 1.25 MG (50000 UT) CAPS Take 50,000 Units by mouth every Thursday.     cloZAPine (CLOZARIL) 25 MG tablet Take 3 tablets (75 mg total) by mouth at bedtime. 90 tablet 0   clozapine (CLOZARIL) 50 MG tablet Take 1 tablet (50 mg total) by mouth daily. 30 tablet 0   diltiazem (CARDIZEM CD) 240 MG 24 hr capsule Take 1 capsule (240 mg total) by mouth daily. (Patient not taking: Reported on 11/24/2021) 30 capsule 0   divalproex (DEPAKOTE ER) 500 MG 24 hr tablet Take 1 tablet (500 mg total) by mouth 2 (two) times daily. 60 tablet 0   glucose blood test strip Use as directed up to four times daily 50 each 0    haloperidol (HALDOL) 10 MG tablet Take 1 tablet (10 mg total) by mouth 3 (three) times daily as needed for agitation (and psychotic symptoms).     INGREZZA 40 MG capsule Take 1 capsule (40 mg total) by mouth in the morning. 30 capsule 0   insulin aspart (NOVOLOG) 100 UNIT/ML FlexPen Before each meal 3 times a day, 140-199 - 2 units, 200-250 - 4 units, 251-299 - 6 units,  300-349 - 8 units,  350 or above 10 units. Insulin PEN if approved, provide syringes and needles if needed.Please switch to any approved short acting Insulin if needed. 15 mL 0   insulin glargine (LANTUS) 100 UNIT/ML Solostar Pen Inject 12 Units into the skin daily. 15 mL 0   Insulin Pen Needle 32G X 4 MM MISC Use 4 times a day with insulin, 1 month supply. 100 each 0   latanoprost (XALATAN) 0.005 % ophthalmic solution Place 1 drop into both eyes at bedtime.     metFORMIN (GLUCOPHAGE) 500 MG tablet Take 500 mg by mouth 2 (two) times daily with a meal.     nicotine (NICODERM CQ - DOSED IN MG/24 HR) 7 mg/24hr patch Place 1 patch (7 mg total) onto the skin daily. 28 patch 0   PROAIR HFA 108 (90 Base) MCG/ACT inhaler Inhale 2 puffs into the lungs every 6 (six) hours as needed for wheezing or shortness of breath.      Labs  Lab Results:  No results displayed because visit has over 200 results.    Admission on 11/22/2021, Discharged on 11/22/2021  Component Date Value Ref Range Status   Glucose-Capillary 11/22/2021 169 (H)  70 - 99 mg/dL Final   Glucose reference range applies only to samples taken after fasting for at least 8 hours.  No results displayed because visit has over 200 results.    Admission on 10/04/2021, Discharged on 10/22/2021  Component Date  Value Ref Range Status   SARS Coronavirus 2 by RT PCR 10/04/2021 NEGATIVE  NEGATIVE Final   Comment: (NOTE) SARS-CoV-2 target nucleic acids are NOT DETECTED.  The SARS-CoV-2 RNA is generally detectable in upper respiratory specimens during the acute phase of infection.  The lowest concentration of SARS-CoV-2 viral copies this assay can detect is 138 copies/mL. A negative result does not preclude SARS-Cov-2 infection and should not be used as the sole basis for treatment or other patient management decisions. A negative result may occur with  improper specimen collection/handling, submission of specimen other than nasopharyngeal swab, presence of viral mutation(s) within the areas targeted by this assay, and inadequate number of viral copies(<138 copies/mL). A negative result must be combined with clinical observations, patient history, and epidemiological information. The expected result is Negative.  Fact Sheet for Patients:  EntrepreneurPulse.com.au  Fact Sheet for Healthcare Providers:  IncredibleEmployment.be  This test is no                          t yet approved or cleared by the [place] FDA and  has been authorized for detection and/or diagnosis of SARS-CoV-2 by FDA under an Emergency Use Authorization (EUA). This EUA will remain  in effect (meaning this test can be used) for the duration of the COVID-19 declaration under Section 564(b)(1) of the Act, 21 U.S.C.section 360bbb-3(b)(1), unless the authorization is terminated  or revoked sooner.       Influenza A by PCR 10/04/2021 NEGATIVE  NEGATIVE Final   Influenza B by PCR 10/04/2021 NEGATIVE  NEGATIVE Final   Comment: (NOTE) The Xpert Xpress SARS-CoV-2/FLU/RSV plus assay is intended as an aid in the diagnosis of influenza from Nasopharyngeal swab specimens and should not be used as a sole basis for treatment. Nasal washings and aspirates are unacceptable for Xpert Xpress SARS-CoV-2/FLU/RSV testing.  Fact Sheet for Patients: EntrepreneurPulse.com.au  Fact Sheet for Healthcare Providers: IncredibleEmployment.be  This test is not yet approved or cleared by the [place] FDA and has been authorized for  detection and/or diagnosis of SARS-CoV-2 by FDA under an Emergency Use Authorization (EUA). This EUA will remain in effect (meaning this test can be used) for the duration of the COVID-19 declaration under Section 564(b)(1) of the Act, 21 U.S.C. section 360bbb-3(b)(1), unless the authorization is terminated or revoked.  Performed at [facility], [place] [address], [place], [place] [address]    WBC 10/04/2021 8.4  4.0 - 10.5 K/uL Final   RBC 10/04/2021 4.42  3.87 - 5.11 MIL/uL Final   Hemoglobin 10/04/2021 11.6 (L)  12.0 - 15.0 g/dL Final   HCT 10/04/2021 35.7 (L)  36.0 - 46.0 % Final   MCV 10/04/2021 80.8  80.0 - 100.0 fL Final   MCH 10/04/2021 26.2  26.0 - 34.0 pg Final   MCHC 10/04/2021 32.5  30.0 - 36.0 g/dL Final   RDW 10/04/2021 16.0 (H)  11.5 - 15.5 % Final   Platelets 10/04/2021 372  150 - 400 K/uL Final   nRBC 10/04/2021 0.0  0.0 - 0.2 % Final   Neutrophils Relative % 10/04/2021 68  % Final   Neutro Abs 10/04/2021 5.7  1.7 - 7.7 K/uL Final   Lymphocytes Relative 10/04/2021 22  % Final   Lymphs Abs 10/04/2021 1.8  0.7 - 4.0 K/uL Final   Monocytes Relative 10/04/2021 8  % Final   Monocytes Absolute 10/04/2021 0.7  0.1 - 1.0 K/uL Final   Eosinophils Relative  10/04/2021 1  % Final   Eosinophils Absolute 10/04/2021 0.1  0.0 - 0.5 K/uL Final   Basophils Relative 10/04/2021 1  % Final   Basophils Absolute 10/04/2021 0.1  0.0 - 0.1 K/uL Final   Immature Granulocytes 10/04/2021 0  % Final   Abs Immature Granulocytes 10/04/2021 0.03  0.00 - 0.07 K/uL Final   Performed at [facility], [place] [address] [place], [place] [address]   Sodium 10/04/2021 136  135 - 145 mmol/L Final   Potassium 10/04/2021 4.2  3.5 - 5.1 mmol/L Final   Chloride 10/04/2021 104  98 - 111 mmol/L Final   CO2 10/04/2021 25  22 - 32 mmol/L Final   Glucose, Bld 10/04/2021 100 (H)  70 - 99 mg/dL Final   Glucose reference range applies only to samples taken after fasting for at least 8 hours.   BUN  10/04/2021 9  6 - 20 mg/dL Final   Creatinine, Ser 10/04/2021 0.52  0.44 - 1.00 mg/dL Final   Calcium 10/04/2021 9.0  8.9 - 10.3 mg/dL Final   Total Protein 10/04/2021 7.0  6.5 - 8.1 g/dL Final   Albumin 10/04/2021 3.2 (L)  3.5 - 5.0 g/dL Final   AST 10/04/2021 13 (L)  15 - 41 U/L Final   ALT 10/04/2021 10  0 - 44 U/L Final   Alkaline Phosphatase 10/04/2021 61  38 - 126 U/L Final   Total Bilirubin 10/04/2021 0.3  0.3 - 1.2 mg/dL Final   GFR, Estimated 10/04/2021 >60  >60 mL/min Final   Comment: (NOTE) Calculated using the CKD-EPI Creatinine Equation (2021)    Anion gap 10/04/2021 7  5 - 15 Final   Performed at [facility] [address] [place], [place] [address]   Hgb A1c MFr Bld 10/04/2021 7.0 (H)  4.8 - 5.6 % Final   Comment: (NOTE) Pre diabetes:          5.7%-6.4%  Diabetes:              >6.4%  Glycemic control for   <7.0% adults with diabetes    Mean Plasma Glucose 10/04/2021 154.2  mg/dL Final   Performed at [facility], [place] [address] [place], [place] [address]   Cholesterol 10/04/2021 178  0 - 200 mg/dL Final   Triglycerides 10/04/2021 155 (H)  <150 mg/dL Final   HDL 10/04/2021 52  >40 mg/dL Final   Total CHOL/HDL Ratio 10/04/2021 3.4  RATIO Final   VLDL 10/04/2021 31  0 - 40 mg/dL Final   LDL Cholesterol 10/04/2021 95  0 - 99 mg/dL Final   Comment:        Total Cholesterol/HDL:CHD Risk Coronary Heart Disease Risk Table                     Men   Women  1/2 Average Risk   3.4   3.3  Average Risk       5.0   4.4  2 X Average Risk   9.6   7.1  3 X Average Risk  23.4   11.0        Use the calculated Patient Ratio above and the CHD Risk Table to determine the patient's CHD Risk.        ATP III CLASSIFICATION (LDL):  <100     mg/dL   Optimal  100-129  mg/dL   Near or Above  Optimal  130-159  mg/dL   Borderline  160-189  mg/dL   High  >190     mg/dL   Very High Performed at [facility] [address], [place], [place]  [address]    POC Amphetamine UR 10/04/2021 None Detected  NONE DETECTED (Cut Off Level 1000 ng/mL) Final   POC Secobarbital (BAR) 10/04/2021 None Detected  NONE DETECTED (Cut Off Level 300 ng/mL) Final   POC Buprenorphine (BUP) 10/04/2021 None Detected  NONE DETECTED (Cut Off Level 10 ng/mL) Final   POC Oxazepam (BZO) 10/04/2021 None Detected  NONE DETECTED (Cut Off Level 300 ng/mL) Final   POC Cocaine UR 10/04/2021 None Detected  NONE DETECTED (Cut Off Level 300 ng/mL) Final   POC Methamphetamine UR 10/04/2021 None Detected  NONE DETECTED (Cut Off Level 1000 ng/mL) Final   POC Morphine 10/04/2021 None Detected  NONE DETECTED (Cut Off Level 300 ng/mL) Final   POC Methadone UR 10/04/2021 None Detected  NONE DETECTED (Cut Off Level 300 ng/mL) Final   POC Oxycodone UR 10/04/2021 Positive (A)  NONE DETECTED (Cut Off Level 100 ng/mL) Final   POC Marijuana UR 10/04/2021 None Detected  NONE DETECTED (Cut Off Level 50 ng/mL) Final   SARSCOV2ONAVIRUS 2 AG 10/04/2021 NEGATIVE  NEGATIVE Final   Comment: (NOTE) SARS-CoV-2 antigen NOT DETECTED.   Negative results are presumptive.  Negative results do not preclude SARS-CoV-2 infection and should not be used as the sole basis for treatment or other patient management decisions, including infection  control decisions, particularly in the presence of clinical signs and  symptoms consistent with COVID-19, or in those who have been in contact with the virus.  Negative results must be combined with clinical observations, patient history, and epidemiological information. The expected result is Negative.  Fact Sheet for Patients: HandmadeRecipes.com.cy  Fact Sheet for Healthcare Providers: FuneralLife.at  This test is not yet approved or cleared by the [place] FDA and  has been authorized for detection and/or diagnosis of SARS-CoV-2 by FDA under an Emergency Use Authorization (EUA).  This EUA will remain in  effect (meaning this test can be used) for the duration of  the COV                          ID-19 declaration under Section 564(b)(1) of the Act, 21 U.S.C. section 360bbb-3(b)(1), unless the authorization is terminated or revoked sooner.     Valproic Acid Lvl 10/04/2021 51  50.0 - 100.0 ug/mL Final   Performed at [facility] [address] [place], [place] [address]   Valproic Acid Lvl 10/08/2021 57  50.0 - 100.0 ug/mL Final   Performed at [facility] [address], [place], [place] [address]   Glucose-Capillary 10/09/2021 123 (H)  70 - 99 mg/dL Final   Glucose reference range applies only to samples taken after fasting for at least 8 hours.  Admission on 09/09/2021, Discharged on 09/10/2021  Component Date Value Ref Range Status   Sodium 09/09/2021 134 (L)  135 - 145 mmol/L Final   Potassium 09/09/2021 4.3  3.5 - 5.1 mmol/L Final   Chloride 09/09/2021 99  98 - 111 mmol/L Final   CO2 09/09/2021 25  22 - 32 mmol/L Final   Glucose, Bld 09/09/2021 107 (H)  70 - 99 mg/dL Final   Glucose reference range applies only to samples taken after fasting for at least 8 hours.   BUN 09/09/2021 12  6 - 20 mg/dL Final   Creatinine, Ser 09/09/2021 0.74  0.44 - 1.00 mg/dL Final   Calcium 09/09/2021 9.0  8.9 - 10.3 mg/dL Final   Total Protein 09/09/2021 7.4  6.5 - 8.1 g/dL Final   Albumin 09/09/2021 3.1 (L)  3.5 - 5.0 g/dL Final   AST 09/09/2021 13 (L)  15 - 41 U/L Final   ALT 09/09/2021 13  0 - 44 U/L Final   Alkaline Phosphatase 09/09/2021 66  38 - 126 U/L Final   Total Bilirubin 09/09/2021 0.2 (L)  0.3 - 1.2 mg/dL Final   GFR, Estimated 09/09/2021 >60  >60 mL/min Final   Comment: (NOTE) Calculated using the CKD-EPI Creatinine Equation (2021)    Anion gap 09/09/2021 10  5 - 15 Final   Performed at [facility], [place] [address], [place], [place] [address]   WBC 09/09/2021 8.5  4.0 - 10.5 K/uL Final   RBC 09/09/2021 4.53  3.87 - 5.11 MIL/uL Final   Hemoglobin 09/09/2021  11.8 (L)  12.0 - 15.0 g/dL Final   HCT 09/09/2021 38.0  36.0 - 46.0 % Final   MCV 09/09/2021 83.9  80.0 - 100.0 fL Final   MCH 09/09/2021 26.0  26.0 - 34.0 pg Final   MCHC 09/09/2021 31.1  30.0 - 36.0 g/dL Final   RDW 09/09/2021 17.8 (H)  11.5 - 15.5 % Final   Platelets 09/09/2021 442 (H)  150 - 400 K/uL Final   nRBC 09/09/2021 0.0  0.0 - 0.2 % Final   Neutrophils Relative % 09/09/2021 70  % Final   Neutro Abs 09/09/2021 5.9  1.7 - 7.7 K/uL Final   Lymphocytes Relative 09/09/2021 20  % Final   Lymphs Abs 09/09/2021 1.7  0.7 - 4.0 K/uL Final   Monocytes Relative 09/09/2021 8  % Final   Monocytes Absolute 09/09/2021 0.7  0.1 - 1.0 K/uL Final   Eosinophils Relative 09/09/2021 1  % Final   Eosinophils Absolute 09/09/2021 0.1  0.0 - 0.5 K/uL Final   Basophils Relative 09/09/2021 1  % Final   Basophils Absolute 09/09/2021 0.1  0.0 - 0.1 K/uL Final   Immature Granulocytes 09/09/2021 0  % Final   Abs Immature Granulocytes 09/09/2021 0.03  0.00 - 0.07 K/uL Final   Performed at [facility], [place] [address], [place], [place] [address]   Ammonia 09/09/2021 37 (H)  9 - 35 umol/L Final   Comment: HEMOLYSIS AT THIS LEVEL MAY AFFECT RESULT Performed at [facility], [place] [address], [place], [place] [address]    Opiates 09/09/2021 NONE DETECTED  NONE DETECTED Final   Cocaine 09/09/2021 NONE DETECTED  NONE DETECTED Final   Benzodiazepines 09/09/2021 NONE DETECTED  NONE DETECTED Final   Amphetamines 09/09/2021 NONE DETECTED  NONE DETECTED Final   Tetrahydrocannabinol 09/09/2021 NONE DETECTED  NONE DETECTED Final   Barbiturates 09/09/2021 NONE DETECTED  NONE DETECTED Final   Comment: (NOTE) DRUG SCREEN FOR MEDICAL PURPOSES ONLY.  IF CONFIRMATION IS NEEDED FOR ANY PURPOSE, NOTIFY LAB WITHIN 5 DAYS.  LOWEST DETECTABLE LIMITS FOR URINE DRUG SCREEN Drug Class                     Cutoff (ng/mL) Amphetamine and metabolites    1000 Barbiturate and metabolites    200 Benzodiazepine                  106 Tricyclics and metabolites     300 Opiates and metabolites  300 Cocaine and metabolites        300 THC                            50 Performed at [facility], [place] [address] [place], [place] [address]    Alcohol, Ethyl (B) 09/09/2021 <10  <10 mg/dL Final   Comment: (NOTE) Lowest detectable limit for serum alcohol is 10 mg/dL.  For medical purposes only. Performed at [facility], [place] [address] [place], [place] [address]    Lipase 09/09/2021 33  11 - 51 U/L Final   Performed at [facility] [address] [place], [place] [address]   Color, Urine 09/09/2021 COLORLESS (A)  YELLOW Final   APPearance 09/09/2021 CLEAR  CLEAR Final   Specific Gravity, Urine 09/09/2021 1.002 (L)  1.005 - 1.030 Final   pH 09/09/2021 6.0  5.0 - 8.0 Final   Glucose, UA 09/09/2021 NEGATIVE  NEGATIVE mg/dL Final   Hgb urine dipstick 09/09/2021 NEGATIVE  NEGATIVE Final   Bilirubin Urine 09/09/2021 NEGATIVE  NEGATIVE Final   Ketones, ur 09/09/2021 NEGATIVE  NEGATIVE mg/dL Final   Protein, ur 09/09/2021 NEGATIVE  NEGATIVE mg/dL Final   Nitrite 09/09/2021 NEGATIVE  NEGATIVE Final   Leukocytes,Ua 09/09/2021 NEGATIVE  NEGATIVE Final   Performed at [facility] [address] [place], [place] [address]   Specimen Description 09/09/2021 URINE, CLEAN CATCH   Final   Special Requests 09/09/2021 NONE   Final   Culture 09/09/2021  (A)   Final                   Value:<10,000 COLONIES/mL INSIGNIFICANT GROWTH Performed at [facility], [place] [address], [place], [place] [address]    Report Status 09/09/2021 09/10/2021 FINAL   Final   D-Dimer, Quant 09/09/2021 0.92 (H)  0.00 - 0.50 ug/mL-FEU Final   Comment: (NOTE) At the manufacturer cut-off value of 0.5 g/mL FEU, this assay has a negative predictive value of 95-100%.This assay is intended for use in conjunction with a clinical pretest probability (PTP) assessment model to exclude pulmonary embolism (PE) and deep  venous thrombosis (DVT) in outpatients suspected of PE or DVT. Results should be correlated with clinical presentation. Performed at [facility], [place] [address] [place], [place] [address]    Troponin I (High Sensitivity) 09/09/2021 5  <18 ng/L Final   Comment: (NOTE) Elevated high sensitivity troponin I (hsTnI) values and significant  changes across serial measurements may suggest ACS but many other  chronic and acute conditions are known to elevate hsTnI results.  Refer to the "Links" section for chest pain algorithms and additional  guidance. Performed at [facility], [place] [address], [place], [place] [address]    BP 09/10/2021 135/83  mmHg Final   S' Lateral 09/10/2021 2.30  cm Final   Area-P 1/2 09/10/2021 5.58  cm2 Final    Blood Alcohol level:  Lab Results  Component Value Date   ETH <10 11/23/2021   ETH <10 36/64/4034    Metabolic Disorder Labs: Lab Results  Component Value Date   HGBA1C 7.0 (H) 10/04/2021   MPG 154.2 10/04/2021   Lab Results  Component Value Date   PROLACTIN 3.8 (L) 11/23/2021   Lab Results  Component Value Date   CHOL 171 11/23/2021   TRIG 125 11/23/2021   HDL 65 11/23/2021   CHOLHDL 2.6 11/23/2021  VLDL 25 11/23/2021   LDLCALC 81 11/23/2021   LDLCALC 95 10/04/2021    Therapeutic Lab Levels: No results found for: "LITHIUM" Lab Results  Component Value Date   VALPROATE 54 12/03/2021   VALPROATE 45 (L) 11/09/2021   No results found for: "CBMZ"  Physical Findings   PHQ2-9    Progress ED from 11/23/2021 in [facility]  PHQ-2 Total Score 2  PHQ-9 Total Score 3      Flowsheet Row ED from 11/23/2021 in [facility] Most recent reading at 11/23/2021  6:44 PM ED from 11/22/2021 in [place] DEPT Most recent reading at 11/22/2021  8:28 PM ED from 11/22/2021 in [place] [facility] DE[facility] Most recent reading at  11/22/2021  1:24 AM  C-SSRS RISK CATEGORY No Risk No Risk No Risk        Musculoskeletal  Strength & Muscle Tone: within normal limits Gait & Station: normal Patient leans: N/A  Psychiatric Specialty Exam  Presentation  General Appearance:  Appropriate for Environment; Casual; Fairly Groomed  Eye Contact: Good  Speech: Clear and Coherent; Normal Rate  Speech Volume: Normal  Handedness: Left  Mood and Affect  Mood: Euthymic  Affect: Appropriate; Congruent; Full Range   Thought Process  Thought Processes: Coherent; Goal Directed; Linear  Descriptions of Associations:Intact  Orientation:Full (Time, Place and Person)  Thought Content:WDL (Patient was to go home, suggested looking at more group homes)  Diagnosis of Schizophrenia or Schizoaffective disorder in past: Yes  Hallucinations:Hallucinations: None    Ideas of Reference:None  Suicidal Thoughts:Suicidal Thoughts: No (Contracted to safety)    Homicidal Thoughts:Homicidal Thoughts: No    Sensorium  Memory: Immediate Good  Judgment: Fair  Insight: Fair   Community education officer  Concentration: Good  Attention Span: Good  Recall: Good  Fund of Knowledge: Good  Language: Good   Psychomotor Activity  Psychomotor Activity: Psychomotor Activity: Normal   Assets  Assets: Communication Skills; Desire for Improvement   Sleep  Sleep: Sleep: Good    Physical Exam  Physical Exam Vitals and nursing note reviewed.  Constitutional:      General: She is not in acute distress.    Appearance: She is not ill-appearing, toxic-appearing or diaphoretic.     Comments: Sleepy appearing  HENT:     Head: Normocephalic and atraumatic.  Pulmonary:     Effort: Pulmonary effort is normal. No respiratory distress.    Blood pressure (!) 109/52, pulse 64, temperature 98.5 F (36.9 C), temperature source Oral, resp. rate 18, SpO2 99 %. There is no height or weight on file to calculate  BMI.  Treatment Plan Summary: [place] remains cleared by psychiatry.   Facility [facility] team along with [facility] health care manager and case manager and Adult [employer] representative continue to seek placement for [place]. Total duration of encounter: 30 days  Dx: SCZ, borderline intellectual functioning, DM (insulin dependent), HLD, COPD, glaucoma, tobacco use d/o   Appreciate pharmacy for assistance with clozapine labs -  weekly ANC monitoring  Continue home meds per below. apixaban, 5 mg, BID clozapine, 50 mg, Daily cloZAPine, 75 mg, QHS diltiazem, 240 mg, Daily divalproex, 500 mg, BID fluticasone furoate-vilanterol, 1 puff, Daily insulin aspart, 0-9 Units, TID WC insulin glargine-yfgn, 10 Units, Daily latanoprost, 1 drop, QHS menthol-cetylpyridinium, 1 lozenge, Once metFORMIN, 1,000 mg, BID WC nicotine, 7 mg, Daily Vitamin D (Ergocalciferol), 50,000 Units, Q7 days    Dispo: Tentative Date: TBA Location: [facility], accepted by  owner [employer] Allegra Lai) Barrier: Pending FL2 form completion  [name], DO 12/23/2021 9:50 AM

## 2021-12-23 NOTE — ED Notes (Signed)
Pt resting quietly with eyes closed.  No pain or discomfort noted/voiced.  Breathing is even and unlabored.  Will continue to monitor for safety.  

## 2021-12-24 DIAGNOSIS — F200 Paranoid schizophrenia: Secondary | ICD-10-CM | POA: Diagnosis not present

## 2021-12-24 LAB — GLUCOSE, CAPILLARY
Glucose-Capillary: 98 mg/dL (ref 70–99)
Glucose-Capillary: 109 mg/dL — ABNORMAL HIGH (ref 70–99)
Glucose-Capillary: 104 mg/dL — ABNORMAL HIGH (ref 70–99)
Glucose-Capillary: 109 mg/dL — ABNORMAL HIGH (ref 70–99)

## 2021-12-24 NOTE — NC FL2 (Signed)
[place] LEVEL OF CARE SCREENING TOOL     IDENTIFICATION  Patient Name: Teresa Murillo Birthdate: Apr 26, 1960 Sex: female Admission Date (Current Location): 11/23/2021  [facility] and [place] Number:  Mamie Nick) [place] (P) [phone] L Facility and Address:  (P)  ([place] [address])      Provider Number: (P)  [facility])  Attending Physician Name and Address:  No att. 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   Noncompliance 11/23/2021   Homelessness 11/23/2021   At risk for self care deficit 11/23/2021   Self-care deficit for medication administration 11/23/2021   Hypothyroidism 11/08/2021   Atrial flutter with rapid ventricular response ([place]) 11/08/2021   Acute encephalopathy 11/08/2021   AKI (acute kidney injury) ([place]) 11/08/2021   Aggressive behavior 10/21/2020   COPD (chronic obstructive pulmonary disease) ([place]) 06/02/2020   Broken ankle 06/02/2020   Glaucoma 06/06/2018   Microcytic anemia 06/05/2018   Schizophrenia ([place]) 04/24/2015   Dyslipidemia 04/24/2015   Essential hypertension 12/15/2014   Type 2 diabetes mellitus without complications ([place]) 38/25/0539    Orientation RESPIRATION BLADDER Height & Weight     Self, Time, Situation, Place  Normal Continent Weight:   Height:     BEHAVIORAL SYMPTOMS/MOOD NEUROLOGICAL BOWEL NUTRITION STATUS   (N/A)  (None Reported) Continent Diet (Diabetic)  AMBULATORY STATUS COMMUNICATION OF NEEDS Skin   Independent Verbally Normal                        Personal Care Assistance Level of Assistance   (None Reported) Bathing Assistance: Independent Feeding assistance: Independent Dressing Assistance: Independent     Functional Limitations Info   (None Reported) Sight Info: Adequate Hearing Info: Adequate Speech Info: Adequate    SPECIAL CARE FACTORS FREQUENCY   (None Reported)                    Contractures Contractures Info: Not present    Additional Factors Info  Allergies (Penicillin) Code Status Info: Full Allergies Info: Penicillin Psychotropic Info: See MAR Insulin Sliding Scale Info: See MAR       Current Medications (12/24/2021):  This is the current hospital active medication list Current Facility-Administered Medications  Medication Dose Route Frequency Provider Last Rate Last Admin   acetaminophen (TYLENOL) tablet 650 mg  650 mg Oral Q6H PRN Rankin, Shuvon B, NP   650 mg at 12/16/21 2156   albuterol (VENTOLIN HFA) 108 (90 Base) MCG/ACT inhaler 2 puff  2 puff Inhalation Q6H PRN Rankin, Shuvon B, NP   2 puff at 12/09/21 2102   alum & mag hydroxide-simeth (MAALOX/MYLANTA) 200-200-20 MG/5ML suspension 30 mL  30 mL Oral Q4H PRN Rankin, Shuvon B, NP   30 mL at 12/19/21 2109   apixaban (ELIQUIS) tablet 5 mg  5 mg Oral BID Rankin, Shuvon B, NP   5 mg at 12/24/21 0906   cloZAPine (CLOZARIL) tablet 50 mg  50 mg Oral Daily [name], NP   50 mg at 12/24/21 [phone]  cloZAPine (CLOZARIL) tablet 75 mg  75 mg Oral QHS [name], NP   75 mg at 12/23/21 2150   diltiazem (CARDIZEM CD) 24 hr capsule 240 mg  240 mg Oral Daily Rankin, Shuvon B, NP   240 mg at 12/24/21 0905   divalproex (DEPAKOTE ER) 24 hr tablet 500 mg  500 mg Oral BID Rankin, Shuvon B, NP   500 mg at 12/24/21 0905   fluticasone furoate-vilanterol (BREO ELLIPTA) 200-25 MCG/ACT 1 puff  1 puff Inhalation Daily Rankin, Shuvon B, NP   1 puff at 12/24/21 0820   haloperidol (HALDOL) tablet 10 mg  10 mg Oral Q8H PRN Rankin, Shuvon B, NP       insulin aspart  (novoLOG) injection 0-9 Units  0-9 Units Subcutaneous TID WC Tharon Aquas, NP   2 Units at 12/23/21 1657   insulin glargine-yfgn (SEMGLEE) injection 10 Units  10 Units Subcutaneous Daily [name], DO   10 Units at 12/24/21 0904   latanoprost (XALATAN) 0.005 % ophthalmic solution 1 drop  1 drop Both Eyes QHS Rankin, Shuvon B, NP   1 drop at 12/23/21 2153   magnesium hydroxide (MILK OF MAGNESIA) suspension 30 mL  30 mL Oral Daily PRN Rankin, Shuvon B, NP       menthol-cetylpyridinium (CEPACOL) lozenge 3 mg  1 lozenge Oral Once [name], NP       menthol-cetylpyridinium (CEPACOL) lozenge 3 mg  1 lozenge Oral PRN [name], NP   3 mg at 12/21/21 2243   metFORMIN (GLUCOPHAGE-XR) 24 hr tablet 1,000 mg  1,000 mg Oral BID WC [name], DO   1,000 mg at 12/24/21 4235   nicotine (NICODERM CQ - dosed in mg/24 hr) patch 7 mg  7 mg Transdermal Daily Rankin, Shuvon B, NP   7 mg at 12/24/21 3614   Vitamin D (Ergocalciferol) (DRISDOL) 1.25 MG (50000 UNIT) capsule 50,000 Units  50,000 Units Oral Q7 days Rankin, Shuvon B, NP   50,000 Units at 12/24/21 4315   Current Outpatient Medications  Medication Sig Dispense Refill   Accu-Chek Softclix Lancets lancets Use as directed up to four times daily 100 each 0   apixaban (ELIQUIS) 5 MG TABS tablet Take 1 tablet (5 mg total) by mouth 2 (two) times daily. 60 tablet 0   ARIPiprazole (ABILIFY) 10 MG tablet Take 1 tablet (10 mg total) by mouth daily. 30 tablet 0   Blood Glucose Monitoring Suppl (ACCU-CHEK GUIDE) w/Device KIT Use as directed up to four times daily 1 kit 0   budesonide-formoterol (SYMBICORT) 160-4.5 MCG/ACT inhaler Inhale 2 puffs into the lungs in the morning and at bedtime.     Cholecalciferol (VITAMIN D3) 1.25 MG (50000 UT) CAPS Take 50,000 Units by mouth every Thursday.     cloZAPine (CLOZARIL) 25 MG tablet Take 3 tablets (75 mg total) by mouth at bedtime. 90 tablet 0   clozapine (CLOZARIL) 50 MG tablet Take 1 tablet (50 mg total)  by mouth daily. 30 tablet 0   diltiazem (CARDIZEM CD) 240 MG 24 hr capsule Take 1 capsule (240 mg total) by mouth daily. (Patient not taking: Reported on 11/24/2021) 30 capsule 0   divalproex (DEPAKOTE ER) 500 MG 24 hr tablet Take 1 tablet (500 mg total) by mouth 2 (two) times daily. 60 tablet 0   glucose blood test strip Use as directed up to four times daily 50 each 0   haloperidol (HALDOL) 10 MG tablet Take 1 tablet (10 mg total) by mouth 3 (  three) times daily as needed for agitation (and psychotic symptoms).     INGREZZA 40 MG capsule Take 1 capsule (40 mg total) by mouth in the morning. 30 capsule 0   insulin aspart (NOVOLOG) 100 UNIT/ML FlexPen Before each meal 3 times a day, 140-199 - 2 units, 200-250 - 4 units, 251-299 - 6 units,  300-349 - 8 units,  350 or above 10 units. Insulin PEN if approved, provide syringes and needles if needed.Please switch to any approved short acting Insulin if needed. 15 mL 0   insulin glargine (LANTUS) 100 UNIT/ML Solostar Pen Inject 12 Units into the skin daily. 15 mL 0   Insulin Pen Needle 32G X 4 MM MISC Use 4 times a day with insulin, 1 month supply. 100 each 0   latanoprost (XALATAN) 0.005 % ophthalmic solution Place 1 drop into both eyes at bedtime.     metFORMIN (GLUCOPHAGE) 500 MG tablet Take 500 mg by mouth 2 (two) times daily with a meal.     nicotine (NICODERM CQ - DOSED IN MG/24 HR) 7 mg/24hr patch Place 1 patch (7 mg total) onto the skin daily. 28 patch 0   PROAIR HFA 108 (90 Base) MCG/ACT inhaler Inhale 2 puffs into the lungs every 6 (six) hours as needed for wheezing or shortness of breath.       Discharge Medications: Please see discharge summary for a list of discharge medications.  Relevant Imaging Results:  Relevant Lab Results:   Additional Information N/A  Ava L Johnnye Sima

## 2021-12-24 NOTE — Care Management (Addendum)
Care Management   Patient is under review for placement at [facility], per her Care Coordinator Janalyn Harder).  The owner of the [place] is Bari Mantis and her contact number is [phone]).

## 2021-12-24 NOTE — ED Notes (Signed)
Snacks given 

## 2021-12-24 NOTE — ED Notes (Signed)
Patient resting quietly in bed with eyes closed. Respirations equal and unlabored, skin warm and dry, NAD. Routine safety checks conducted according to facility protocol. Will continue to monitor for safety.  

## 2021-12-24 NOTE — ED Notes (Signed)
Sleeping with no signs of distress or disturbed sleep patterns respirations are easy and skin color appropriate for her ethnicity.

## 2021-12-24 NOTE — Care Management (Addendum)
Care Management   Writer consulted with the RN working with the patient Dr. [name] in order to complete the FL-2 Form.

## 2021-12-24 NOTE — ED Provider Notes (Signed)
Behavioral Health Progress Note  Date and Time: 12/24/2021 10:37 AM Name: Teresa Murillo MRN:  [id number]  HPI and UC Course: Teresa Murillo is a 61 y.o. female with a past psychiatric history of schizophrenia, aggressive behavior, and possible intellectual disability presenting to [facility] on 11/23/21 voluntarily as a walk in via [facility] with complaints that she was locked out of the group home. Patient has been dismissed from group home due to multiple elopements. Pt had already been discharged from this facility but had been living there temporarily while DSS was looking for new placement. Pt is boarding at [facility].   Completed an AID to Capacity Evaluation (ACE) with attending Dr. Dwyane Dee on 12/11/2021.  Please see media tab for full details.    Completed test with [name], PhD: Wechsler Adult Intelligence Scale-4, Teresa Murillo achieved a full-scale IQ score of 73 and a percentile rank of 4 placing her in the borderline range of intellectual functioning (12/14/2021) Please see consult note from [name], PhD on 12/14/2021   Subjective:  Patient was initially seen this a.m. alseep, awoken easily.  She had already been informed that the group home placement offer at [facility] had been taken back because she is on insulin and the group home does not have a nurse to administer the insulin. She reports that she had a bowel movement yesterday.  She had no other questions or concerns.    HE:NIDPOEUM Thoughts: No (Contracted to safety) PN:TIRWERXVQ Thoughts: No MGQ:[id number]: None Ideas of WPY:KDXI   Mood: Euthymic Sleep:Good Appetite: Good   Review of Systems  Respiratory:  Negative for shortness of breath.   Cardiovascular:  Negative for chest pain.  Gastrointestinal:  Negative for abdominal pain, diarrhea, nausea and vomiting.  Neurological:  Negative for dizziness and headaches.       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 On the Wechsler Adult Intelligence Scale-4, Ms. Teresa Murillo achieved a full-scale IQ score of 73 and a percentile rank of 4 placing her in the borderline range of intellectual functioning.    Chronic obstructive pulmonary disease (COPD) (HCC)    Glaucoma    Hyperlipidemia    Hypertension    Iron deficiency    Schizoaffective disorder (HCC)    Type 2 diabetes mellitus (HCC)     Past Surgical History:  Procedure Laterality Date   TUBAL LIGATION     Family History:  Family History  Problem Relation Age of Onset   Breast cancer Maternal Grandmother    Family Psychiatric  History: None reported Social History:  Social History   Substance and Sexual Activity  Alcohol Use Yes     Social History   Substance and Sexual Activity  Drug Use Not Currently    Social History   Socioeconomic History   Marital status: Divorced    Spouse name: Not on file   Number of children: Not on file   Years of education: Not on file   Highest education level: Not on file  Occupational History   Not on file  Tobacco Use   Smoking status: Every Day    Packs/day: 1.00    Types: Cigars, Cigarettes  Smokeless tobacco: Current  Vaping Use   Vaping Use: Never used  Substance and Sexual Activity   Alcohol use: Yes   Drug use: Not Currently   Sexual activity: Not Currently    Birth control/protection: Surgical  Other Topics Concern   Not on file  Social History Narrative   Not on file   Social Determinants of Health   Financial Resource Strain: Not on file  Food Insecurity: Not on file  Transportation Needs: Not on  file  Physical Activity: Not on file  Stress: Not on file  Social Connections: Not on file   SDOH:  SDOH Screenings   Depression (PHQ2-9): Low Risk  (11/23/2021)  Tobacco Use: High Risk (12/14/2021)   Additional Social History:    Pain Medications: See MAR Prescriptions: See MAR Over the Counter: See MAR History of alcohol / drug use?: No history of alcohol / drug abuse Longest period of sobriety (when/how long): N/A                    Current Medications:  Current Facility-Administered Medications  Medication Dose Route Frequency Provider Last Rate Last Admin   acetaminophen (TYLENOL) tablet 650 mg  650 mg Oral Q6H PRN Rankin, Shuvon B, NP   650 mg at 12/16/21 2156   albuterol (VENTOLIN HFA) 108 (90 Base) MCG/ACT inhaler 2 puff  2 puff Inhalation Q6H PRN Rankin, Shuvon B, NP   2 puff at 12/09/21 2102   alum & mag hydroxide-simeth (MAALOX/MYLANTA) 200-200-20 MG/5ML suspension 30 mL  30 mL Oral Q4H PRN Rankin, Shuvon B, NP   30 mL at 12/19/21 2109   apixaban (ELIQUIS) tablet 5 mg  5 mg Oral BID Rankin, Shuvon B, NP   5 mg at 12/24/21 0906   cloZAPine (CLOZARIL) tablet 50 mg  50 mg Oral Daily [name], NP   50 mg at 12/24/21 0905   cloZAPine (CLOZARIL) tablet 75 mg  75 mg Oral QHS [name], NP   75 mg at 12/23/21 2150   diltiazem (CARDIZEM CD) 24 hr capsule 240 mg  240 mg Oral Daily Rankin, Shuvon B, NP   240 mg at 12/24/21 0905   divalproex (DEPAKOTE ER) 24 hr tablet 500 mg  500 mg Oral BID Rankin, Shuvon B, NP   500 mg at 12/24/21 0905   fluticasone furoate-vilanterol (BREO ELLIPTA) 200-25 MCG/ACT 1 puff  1 puff Inhalation Daily Rankin, Shuvon B, NP   1 puff at 12/24/21 0820   haloperidol (HALDOL) tablet 10 mg  10 mg Oral Q8H PRN Rankin, Shuvon B, NP       insulin aspart (novoLOG) injection 0-9 Units  0-9 Units Subcutaneous TID WC Tharon Aquas, NP   2 Units at 12/23/21 1657   insulin glargine-yfgn (SEMGLEE) injection 10 Units  10 Units Subcutaneous Daily  [name], DO   10 Units at 12/24/21 0904   latanoprost (XALATAN) 0.005 % ophthalmic solution 1 drop  1 drop Both Eyes QHS Rankin, Shuvon B, NP   1 drop at 12/23/21 2153   magnesium hydroxide (MILK OF MAGNESIA) suspension 30 mL  30 mL Oral Daily PRN Rankin, Shuvon B, NP       menthol-cetylpyridinium (CEPACOL) lozenge 3 mg  1 lozenge Oral Once [name], NP       menthol-cetylpyridinium (CEPACOL) lozenge 3 mg  1 lozenge Oral PRN [name], NP   3 mg at 12/21/21 2243   metFORMIN (GLUCOPHAGE-XR) 24 hr tablet 1,000 mg  1,000 mg Oral BID  WC [name], DO   1,000 mg at 12/24/21 3734   nicotine (NICODERM CQ - dosed in mg/24 hr) patch 7 mg  7 mg Transdermal Daily Rankin, Shuvon B, NP   7 mg at 12/24/21 2876   Vitamin D (Ergocalciferol) (DRISDOL) 1.25 MG (50000 UNIT) capsule 50,000 Units  50,000 Units Oral Q7 days Rankin, Shuvon B, NP   50,000 Units at 12/24/21 8115   Current Outpatient Medications  Medication Sig Dispense Refill   Accu-Chek Softclix Lancets lancets Use as directed up to four times daily 100 each 0   apixaban (ELIQUIS) 5 MG TABS tablet Take 1 tablet (5 mg total) by mouth 2 (two) times daily. 60 tablet 0   ARIPiprazole (ABILIFY) 10 MG tablet Take 1 tablet (10 mg total) by mouth daily. 30 tablet 0   Blood Glucose Monitoring Suppl (ACCU-CHEK GUIDE) w/Device KIT Use as directed up to four times daily 1 kit 0   budesonide-formoterol (SYMBICORT) 160-4.5 MCG/ACT inhaler Inhale 2 puffs into the lungs in the morning and at bedtime.     Cholecalciferol (VITAMIN D3) 1.25 MG (50000 UT) CAPS Take 50,000 Units by mouth every Thursday.     cloZAPine (CLOZARIL) 25 MG tablet Take 3 tablets (75 mg total) by mouth at bedtime. 90 tablet 0   clozapine (CLOZARIL) 50 MG tablet Take 1 tablet (50 mg total) by mouth daily. 30 tablet 0   diltiazem (CARDIZEM CD) 240 MG 24 hr capsule Take 1 capsule (240 mg total) by mouth daily. (Patient not taking: Reported on 11/24/2021) 30 capsule 0   divalproex  (DEPAKOTE ER) 500 MG 24 hr tablet Take 1 tablet (500 mg total) by mouth 2 (two) times daily. 60 tablet 0   glucose blood test strip Use as directed up to four times daily 50 each 0   haloperidol (HALDOL) 10 MG tablet Take 1 tablet (10 mg total) by mouth 3 (three) times daily as needed for agitation (and psychotic symptoms).     INGREZZA 40 MG capsule Take 1 capsule (40 mg total) by mouth in the morning. 30 capsule 0   insulin aspart (NOVOLOG) 100 UNIT/ML FlexPen Before each meal 3 times a day, 140-199 - 2 units, 200-250 - 4 units, 251-299 - 6 units,  300-349 - 8 units,  350 or above 10 units. Insulin PEN if approved, provide syringes and needles if needed.Please switch to any approved short acting Insulin if needed. 15 mL 0   insulin glargine (LANTUS) 100 UNIT/ML Solostar Pen Inject 12 Units into the skin daily. 15 mL 0   Insulin Pen Needle 32G X 4 MM MISC Use 4 times a day with insulin, 1 month supply. 100 each 0   latanoprost (XALATAN) 0.005 % ophthalmic solution Place 1 drop into both eyes at bedtime.     metFORMIN (GLUCOPHAGE) 500 MG tablet Take 500 mg by mouth 2 (two) times daily with a meal.     nicotine (NICODERM CQ - DOSED IN MG/24 HR) 7 mg/24hr patch Place 1 patch (7 mg total) onto the skin daily. 28 patch 0   PROAIR HFA 108 (90 Base) MCG/ACT inhaler Inhale 2 puffs into the lungs every 6 (six) hours as needed for wheezing or shortness of breath.      Labs  Lab Results:  No results displayed because visit has over 200 results.    Admission on 11/22/2021, Discharged on 11/22/2021  Component Date Value Ref Range Status   Glucose-Capillary 11/22/2021 169 (H)  70 - 99 mg/dL Final  Glucose reference range applies only to samples taken after fasting for at least 8 hours.  No results displayed because visit has over 200 results.    Admission on 10/04/2021, Discharged on 10/22/2021  Component Date Value Ref Range Status   SARS Coronavirus 2 by RT PCR 10/04/2021 NEGATIVE  NEGATIVE Final    Comment: (NOTE) SARS-CoV-2 target nucleic acids are NOT DETECTED.  The SARS-CoV-2 RNA is generally detectable in upper respiratory specimens during the acute phase of infection. The lowest concentration of SARS-CoV-2 viral copies this assay can detect is 138 copies/mL. A negative result does not preclude SARS-Cov-2 infection and should not be used as the sole basis for treatment or other patient management decisions. A negative result may occur with  improper specimen collection/handling, submission of specimen other than nasopharyngeal swab, presence of viral mutation(s) within the areas targeted by this assay, and inadequate number of viral copies(<138 copies/mL). A negative result must be combined with clinical observations, patient history, and epidemiological information. The expected result is Negative.  Fact Sheet for Patients:  EntrepreneurPulse.com.au  Fact Sheet for Healthcare Providers:  IncredibleEmployment.be  This test is no                          t yet approved or cleared by the [place] FDA and  has been authorized for detection and/or diagnosis of SARS-CoV-2 by FDA under an Emergency Use Authorization (EUA). This EUA will remain  in effect (meaning this test can be used) for the duration of the COVID-19 declaration under Section 564(b)(1) of the Act, 21 U.S.C.section 360bbb-3(b)(1), unless the authorization is terminated  or revoked sooner.       Influenza A by PCR 10/04/2021 NEGATIVE  NEGATIVE Final   Influenza B by PCR 10/04/2021 NEGATIVE  NEGATIVE Final   Comment: (NOTE) The Xpert Xpress SARS-CoV-2/FLU/RSV plus assay is intended as an aid in the diagnosis of influenza from Nasopharyngeal swab specimens and should not be used as a sole basis for treatment. Nasal washings and aspirates are unacceptable for Xpert Xpress SARS-CoV-2/FLU/RSV testing.  Fact Sheet for  Patients: EntrepreneurPulse.com.au  Fact Sheet for Healthcare Providers: IncredibleEmployment.be  This test is not yet approved or cleared by the [place] FDA and has been authorized for detection and/or diagnosis of SARS-CoV-2 by FDA under an Emergency Use Authorization (EUA). This EUA will remain in effect (meaning this test can be used) for the duration of the COVID-19 declaration under Section 564(b)(1) of the Act, 21 U.S.C. section 360bbb-3(b)(1), unless the authorization is terminated or revoked.  Performed at [facility], [place] [address], [place], [place] [address]    WBC 10/04/2021 8.4  4.0 - 10.5 K/uL Final   RBC 10/04/2021 4.42  3.87 - 5.11 MIL/uL Final   Hemoglobin 10/04/2021 11.6 (L)  12.0 - 15.0 g/dL Final   HCT 10/04/2021 35.7 (L)  36.0 - 46.0 % Final   MCV 10/04/2021 80.8  80.0 - 100.0 fL Final   MCH 10/04/2021 26.2  26.0 - 34.0 pg Final   MCHC 10/04/2021 32.5  30.0 - 36.0 g/dL Final   RDW 10/04/2021 16.0 (H)  11.5 - 15.5 % Final   Platelets 10/04/2021 372  150 - 400 K/uL Final   nRBC 10/04/2021 0.0  0.0 - 0.2 % Final   Neutrophils Relative % 10/04/2021 68  % Final   Neutro Abs 10/04/2021 5.7  1.7 - 7.7 K/uL Final   Lymphocytes Relative 10/04/2021 22  % Final  Lymphs Abs 10/04/2021 1.8  0.7 - 4.0 K/uL Final   Monocytes Relative 10/04/2021 8  % Final   Monocytes Absolute 10/04/2021 0.7  0.1 - 1.0 K/uL Final   Eosinophils Relative 10/04/2021 1  % Final   Eosinophils Absolute 10/04/2021 0.1  0.0 - 0.5 K/uL Final   Basophils Relative 10/04/2021 1  % Final   Basophils Absolute 10/04/2021 0.1  0.0 - 0.1 K/uL Final   Immature Granulocytes 10/04/2021 0  % Final   Abs Immature Granulocytes 10/04/2021 0.03  0.00 - 0.07 K/uL Final   Performed at [facility], [place] [address] [place], [place] [address]   Sodium 10/04/2021 136  135 - 145 mmol/L Final   Potassium 10/04/2021 4.2  3.5 - 5.1 mmol/L Final   Chloride  10/04/2021 104  98 - 111 mmol/L Final   CO2 10/04/2021 25  22 - 32 mmol/L Final   Glucose, Bld 10/04/2021 100 (H)  70 - 99 mg/dL Final   Glucose reference range applies only to samples taken after fasting for at least 8 hours.   BUN 10/04/2021 9  6 - 20 mg/dL Final   Creatinine, Ser 10/04/2021 0.52  0.44 - 1.00 mg/dL Final   Calcium 10/04/2021 9.0  8.9 - 10.3 mg/dL Final   Total Protein 10/04/2021 7.0  6.5 - 8.1 g/dL Final   Albumin 10/04/2021 3.2 (L)  3.5 - 5.0 g/dL Final   AST 10/04/2021 13 (L)  15 - 41 U/L Final   ALT 10/04/2021 10  0 - 44 U/L Final   Alkaline Phosphatase 10/04/2021 61  38 - 126 U/L Final   Total Bilirubin 10/04/2021 0.3  0.3 - 1.2 mg/dL Final   GFR, Estimated 10/04/2021 >60  >60 mL/min Final   Comment: (NOTE) Calculated using the CKD-EPI Creatinine Equation (2021)    Anion gap 10/04/2021 7  5 - 15 Final   Performed at [facility] [address] [place], [place] [address]   Hgb A1c MFr Bld 10/04/2021 7.0 (H)  4.8 - 5.6 % Final   Comment: (NOTE) Pre diabetes:          5.7%-6.4%  Diabetes:              >6.4%  Glycemic control for   <7.0% adults with diabetes    Mean Plasma Glucose 10/04/2021 154.2  mg/dL Final   Performed at [facility], [place] [address] [place] [address], [place], [place] [address]   Cholesterol 10/04/2021 178  0 - 200 mg/dL Final   Triglycerides 10/04/2021 155 (H)  <150 mg/dL Final   HDL 10/04/2021 52  >40 mg/dL Final   Total CHOL/HDL Ratio 10/04/2021 3.4  RATIO Final   VLDL 10/04/2021 31  0 - 40 mg/dL Final   LDL Cholesterol 10/04/2021 95  0 - 99 mg/dL Final   Comment:        Total Cholesterol/HDL:CHD Risk Coronary Heart Disease Risk Table                     Men   Women  1/2 Average Risk   3.4   3.3  Average Risk       5.0   4.4  2 X Average Risk   9.6   7.1  3 X Average Risk  23.4   11.0        Use the calculated Patient Ratio above and the CHD Risk Table to determine the patient's CHD Risk.  ATP III CLASSIFICATION (LDL):   <100     mg/dL   Optimal  100-129  mg/dL   Near or Above                    Optimal  130-159  mg/dL   Borderline  160-189  mg/dL   High  >190     mg/dL   Very High Performed at [facility] [address], [place], [place] [address]    POC Amphetamine UR 10/04/2021 None Detected  NONE DETECTED (Cut Off Level 1000 ng/mL) Final   POC Secobarbital (BAR) 10/04/2021 None Detected  NONE DETECTED (Cut Off Level 300 ng/mL) Final   POC Buprenorphine (BUP) 10/04/2021 None Detected  NONE DETECTED (Cut Off Level 10 ng/mL) Final   POC Oxazepam (BZO) 10/04/2021 None Detected  NONE DETECTED (Cut Off Level 300 ng/mL) Final   POC Cocaine UR 10/04/2021 None Detected  NONE DETECTED (Cut Off Level 300 ng/mL) Final   POC Methamphetamine UR 10/04/2021 None Detected  NONE DETECTED (Cut Off Level 1000 ng/mL) Final   POC Morphine 10/04/2021 None Detected  NONE DETECTED (Cut Off Level 300 ng/mL) Final   POC Methadone UR 10/04/2021 None Detected  NONE DETECTED (Cut Off Level 300 ng/mL) Final   POC Oxycodone UR 10/04/2021 Positive (A)  NONE DETECTED (Cut Off Level 100 ng/mL) Final   POC Marijuana UR 10/04/2021 None Detected  NONE DETECTED (Cut Off Level 50 ng/mL) Final   SARSCOV2ONAVIRUS 2 AG 10/04/2021 NEGATIVE  NEGATIVE Final   Comment: (NOTE) SARS-CoV-2 antigen NOT DETECTED.   Negative results are presumptive.  Negative results do not preclude SARS-CoV-2 infection and should not be used as the sole basis for treatment or other patient management decisions, including infection  control decisions, particularly in the presence of clinical signs and  symptoms consistent with COVID-19, or in those who have been in contact with the virus.  Negative results must be combined with clinical observations, patient history, and epidemiological information. The expected result is Negative.  Fact Sheet for Patients: HandmadeRecipes.com.cy  Fact Sheet for Healthcare  Providers: FuneralLife.at  This test is not yet approved or cleared by the [place] FDA and  has been authorized for detection and/or diagnosis of SARS-CoV-2 by FDA under an Emergency Use Authorization (EUA).  This EUA will remain in effect (meaning this test can be used) for the duration of  the COV                          ID-19 declaration under Section 564(b)(1) of the Act, 21 U.S.C. section 360bbb-3(b)(1), unless the authorization is terminated or revoked sooner.     Valproic Acid Lvl 10/04/2021 51  50.0 - 100.0 ug/mL Final   Performed at [facility] [address] [place], [place] [address]   Valproic Acid Lvl 10/08/2021 57  50.0 - 100.0 ug/mL Final   Performed at [facility] [address], [place],  [address]   Glucose-Capillary 10/09/2021 123 (H)  70 - 99 mg/dL Final   Glucose reference range applies only to samples taken after fasting for at least 8 hours.  Admission on 09/09/2021, Discharged on 09/10/2021  Component Date Value Ref Range Status   Sodium 09/09/2021 134 (L)  135 - 145 mmol/L Final   Potassium 09/09/2021 4.3  3.5 - 5.1 mmol/L Final   Chloride 09/09/2021 99  98 - 111 mmol/L Final   CO2 09/09/2021 25  22 - 32 mmol/L Final   Glucose, Bld 09/09/2021 107 (H)  70 - 99 mg/dL Final   Glucose reference range applies only to samples taken after fasting for at least 8 hours.   BUN 09/09/2021 12  6 - 20 mg/dL Final   Creatinine, Ser 09/09/2021 0.74  0.44 - 1.00 mg/dL Final   Calcium 09/09/2021 9.0  8.9 - 10.3 mg/dL Final   Total Protein 09/09/2021 7.4  6.5 - 8.1 g/dL Final   Albumin 09/09/2021 3.1 (L)  3.5 - 5.0 g/dL Final   AST 09/09/2021 13 (L)  15 - 41 U/L Final   ALT 09/09/2021 13  0 - 44 U/L Final   Alkaline Phosphatase 09/09/2021 66  38 - 126 U/L Final   Total Bilirubin 09/09/2021 0.2 (L)  0.3 - 1.2 mg/dL Final   GFR, Estimated 09/09/2021 >60  >60 mL/min Final   Comment: (NOTE) Calculated using the CKD-EPI  Creatinine Equation (2021)    Anion gap 09/09/2021 10  5 - 15 Final   Performed at [facility], [place] [address] [place], [place] [address]   WBC 09/09/2021 8.5  4.0 - 10.5 K/uL Final   RBC 09/09/2021 4.53  3.87 - 5.11 MIL/uL Final   Hemoglobin 09/09/2021 11.8 (L)  12.0 - 15.0 g/dL Final   HCT 09/09/2021 38.0  36.0 - 46.0 % Final   MCV 09/09/2021 83.9  80.0 - 100.0 fL Final   MCH 09/09/2021 26.0  26.0 - 34.0 pg Final   MCHC 09/09/2021 31.1  30.0 - 36.0 g/dL Final   RDW 09/09/2021 17.8 (H)  11.5 - 15.5 % Final   Platelets 09/09/2021 442 (H)  150 - 400 K/uL Final   nRBC 09/09/2021 0.0  0.0 - 0.2 % Final   Neutrophils Relative % 09/09/2021 70  % Final   Neutro Abs 09/09/2021 5.9  1.7 - 7.7 K/uL Final   Lymphocytes Relative 09/09/2021 20  % Final   Lymphs Abs 09/09/2021 1.7  0.7 - 4.0 K/uL Final   Monocytes Relative 09/09/2021 8  % Final   Monocytes Absolute 09/09/2021 0.7  0.1 - 1.0 K/uL Final   Eosinophils Relative 09/09/2021 1  % Final   Eosinophils Absolute 09/09/2021 0.1  0.0 - 0.5 K/uL Final   Basophils Relative 09/09/2021 1  % Final   Basophils Absolute 09/09/2021 0.1  0.0 - 0.1 K/uL Final   Immature Granulocytes 09/09/2021 0  % Final   Abs Immature Granulocytes 09/09/2021 0.03  0.00 - 0.07 K/uL Final   Performed at [facility], [place] [address], [place], [place] [address]   Ammonia 09/09/2021 37 (H)  9 - 35 umol/L Final   Comment: HEMOLYSIS AT THIS LEVEL MAY AFFECT RESULT Performed at [facility], [place] [address] [place], [place] [address]    Opiates 09/09/2021 NONE DETECTED  NONE DETECTED Final   Cocaine 09/09/2021 NONE DETECTED  NONE DETECTED Final   Benzodiazepines 09/09/2021 NONE DETECTED  NONE DETECTED Final   Amphetamines 09/09/2021 NONE DETECTED  NONE DETECTED Final   Tetrahydrocannabinol 09/09/2021 NONE DETECTED  NONE DETECTED Final   Barbiturates 09/09/2021 NONE DETECTED  NONE DETECTED Final   Comment: (NOTE) DRUG SCREEN FOR MEDICAL PURPOSES ONLY.   IF CONFIRMATION IS NEEDED FOR ANY PURPOSE, NOTIFY LAB WITHIN 5 DAYS.  LOWEST DETECTABLE LIMITS FOR URINE DRUG SCREEN Drug Class                     Cutoff (ng/mL) Amphetamine and  metabolites    1000 Barbiturate and metabolites    200 Benzodiazepine                 158 Tricyclics and metabolites     300 Opiates and metabolites        300 Cocaine and metabolites        300 THC                            50 Performed at [facility], [place] [address] [place], [place] [address]    Alcohol, Ethyl (B) 09/09/2021 <10  <10 mg/dL Final   Comment: (NOTE) Lowest detectable limit for serum alcohol is 10 mg/dL.  For medical purposes only. Performed at [facility], [place] [address] [place], [place] [address]    Lipase 09/09/2021 33  11 - 51 U/L Final   Performed at [facility] [address] [place], [place] [address]   Color, Urine 09/09/2021 COLORLESS (A)  YELLOW Final   APPearance 09/09/2021 CLEAR  CLEAR Final   Specific Gravity, Urine 09/09/2021 1.002 (L)  1.005 - 1.030 Final   pH 09/09/2021 6.0  5.0 - 8.0 Final   Glucose, UA 09/09/2021 NEGATIVE  NEGATIVE mg/dL Final   Hgb urine dipstick 09/09/2021 NEGATIVE  NEGATIVE Final   Bilirubin Urine 09/09/2021 NEGATIVE  NEGATIVE Final   Ketones, ur 09/09/2021 NEGATIVE  NEGATIVE mg/dL Final   Protein, ur 09/09/2021 NEGATIVE  NEGATIVE mg/dL Final   Nitrite 09/09/2021 NEGATIVE  NEGATIVE Final   Leukocytes,Ua 09/09/2021 NEGATIVE  NEGATIVE Final   Performed at [facility] [address], [place], [place] [address]   Specimen Description 09/09/2021 URINE, CLEAN CATCH   Final   Special Requests 09/09/2021 NONE   Final   Culture 09/09/2021  (A)   Final                   Value:<10,000 COLONIES/mL INSIGNIFICANT GROWTH Performed at [facility], [place] [address], [place], [place] [address]    Report Status 09/09/2021 09/10/2021 FINAL   Final   D-Dimer, Quant 09/09/2021 0.92 (H)  0.00 - 0.50 ug/mL-FEU Final    Comment: (NOTE) At the manufacturer cut-off value of 0.5 g/mL FEU, this assay has a negative predictive value of 95-100%.This assay is intended for use in conjunction with a clinical pretest probability (PTP) assessment model to exclude pulmonary embolism (PE) and deep venous thrombosis (DVT) in outpatients suspected of PE or DVT. Results should be correlated with clinical presentation. Performed at [facility], [place] [address] [place], [place] [address]    Troponin I (High Sensitivity) 09/09/2021 5  <18 ng/L Final   Comment: (NOTE) Elevated high sensitivity troponin I (hsTnI) values and significant  changes across serial measurements may suggest ACS but many other  chronic and acute conditions are known to elevate hsTnI results.  Refer to the "Links" section for chest pain algorithms and additional  guidance. Performed at [facility], [place] [address], [place], [place] [address]    BP 09/10/2021 135/83  mmHg Final   S' Lateral 09/10/2021 2.30  cm Final   Area-P 1/2 09/10/2021 5.58  cm2 Final    Blood Alcohol level:  Lab Results  Component Value Date   ETH <10 11/23/2021   ETH <10 77/12/6577    Metabolic Disorder Labs: Lab Results  Component Value Date   HGBA1C 7.0 (H) 10/04/2021  MPG 154.2 10/04/2021   Lab Results  Component Value Date   PROLACTIN 3.8 (L) 11/23/2021   Lab Results  Component Value Date   CHOL 171 11/23/2021   TRIG 125 11/23/2021   HDL 65 11/23/2021   CHOLHDL 2.6 11/23/2021   VLDL 25 11/23/2021   LDLCALC 81 11/23/2021   LDLCALC 95 10/04/2021    Therapeutic Lab Levels: No results found for: "LITHIUM" Lab Results  Component Value Date   VALPROATE 54 12/03/2021   VALPROATE 45 (L) 11/09/2021   No results found for: "CBMZ"  Physical Findings   PHQ2-9    [place] ED from 11/23/2021 in [facility]  PHQ-2 Total Score 2  PHQ-9 Total Score 3      Flowsheet Row ED from 11/23/2021 in [facility] Most recent reading at 11/23/2021  6:44 PM ED from 11/22/2021 in [facility] Most recent reading at 11/22/2021  8:28 PM ED from 11/22/2021 in [facility] Most recent reading at 11/22/2021  1:24 AM  C-SSRS RISK CATEGORY No Risk No Risk No Risk        Musculoskeletal  Strength & Muscle Tone: within normal limits Gait & Station: normal Patient leans: N/A  Psychiatric Specialty Exam  Presentation  General Appearance:  Appropriate for Environment; Casual; Fairly Groomed  Eye Contact: Good  Speech: Clear and Coherent; Normal Rate  Speech Volume: Normal  Handedness: Left   Mood and Affect  Mood: Euthymic  Affect: Appropriate; Congruent; Full Range   Thought Process  Thought Processes: Coherent; Goal Directed; Linear  Descriptions of Associations:Intact  Orientation:Full (Time, Place and Person)  Thought Content:WDL (Patient was to go home, suggested looking at more group homes)  Diagnosis of Schizophrenia or Schizoaffective disorder in past: Yes  Duration of Psychotic Symptoms: NA  Hallucinations:Denied AVH Ideas of Reference:None  Suicidal Thoughts:Denied SI Homicidal Thoughts:Denied HI  Sensorium  Memory: Immediate Good  Judgment: Fair  Insight: Fair   Community education officer  Concentration: Good  Attention Span: Good  Recall: Good  Fund of Knowledge: Good  Language: Good   Psychomotor Activity  Psychomotor Activity:Normal. AIMS 0  Assets  Assets: Communication Skills; Desire for Improvement   Sleep  Sleep:Good   Physical Exam  Physical Exam Vitals and nursing note reviewed.  Constitutional:      General: She is not in acute distress.    Appearance: She is not ill-appearing or diaphoretic.  HENT:     Head: Normocephalic and atraumatic.  Pulmonary:     Effort: Pulmonary effort is normal. No respiratory distress.  Neurological:      General: No focal deficit present.     Mental Status: She is alert.     Blood pressure (!) 140/78, pulse (!) 102, temperature (!) 97.5 F (36.4 C), temperature source Oral, resp. rate 20, SpO2 97 %. There is no height or weight on file to calculate BMI.  Treatment Plan Summary: [facility] remains cleared by psychiatry.   Facility [facility] team along with [facility] health care manager and case manager and Adult [employer] representative continue to seek placement for Marmet. Total duration of encounter: 31 days   Dx: SCZ, borderline intellectual functioning, DM (insulin dependent), HLD, COPD, glaucoma, tobacco use d/o   Appreciate pharmacy for assistance with clozapine labs -  weekly ANC monitoring   Continue home meds per below. apixaban, 5 mg, BID clozapine, 50 mg, Daily cloZAPine, 75 mg, QHS diltiazem, 240 mg, Daily divalproex, 500 mg,  BID fluticasone furoate-vilanterol, 1 puff, Daily insulin aspart, 0-9 Units, TID WC insulin glargine-yfgn, 10 Units, Daily latanoprost, 1 drop, QHS menthol-cetylpyridinium, 1 lozenge, Once metFORMIN, 1,000 mg, BID WC nicotine, 7 mg, Daily Vitamin D (Ergocalciferol), 50,000 Units, Q7 days    Dispo: Tentative Date: TBA Location: TBA Barrier: Pending accepting facility with nursing capabilities  Clara Hiram Comber 12/24/2021 10:37 AM   I was present for the entirety of the evaluation on 12/24/2021. I reviewed the patient's chart, and I participated in key portions of the service. I discussed the case with the PA student, and I agree with the assessment and plan of care as documented in the PA student's note.   Signed: [name], DO Psychiatry Resident, PGY-2 [facility]  12/24/2021, 11:53 AM

## 2021-12-24 NOTE — ED Notes (Signed)
Patient resting with no sxs of distress noted - will continue to monitor for safety 

## 2021-12-24 NOTE — ED Notes (Signed)
Patient A&Ox4. Denies intent to harm self/others when asked. Denies A/VH. Patient denies any physical complaints when asked. No acute distress noted. Support and encouragement provided. Routine safety checks conducted according to facility protocol. Encouraged patient to notify staff if thoughts of harm toward self or others arise. Patient verbalize understanding and agreement. Will continue to monitor for safety.    

## 2021-12-24 NOTE — ED Notes (Signed)
Remains asleep with no signs of distress.

## 2021-12-24 NOTE — ED Notes (Signed)
Awake talking with room mate displaying fair social skills although affect remains flat .

## 2021-12-24 NOTE — ED Notes (Signed)
Pt accepted scheduled meds this morning w/o difficulty. Snack recently offered, pt refused. Pt denied SI/HI/AVH. Pt appears to have a sad, depressed mood. Questioned Pt and Pt stated, "They are going to try and get a nurse to come out to give me my insulin or that thing that I will wear on my arm" as she is pointing to her upper arm area. Informed Pt to remain positive about gaining housing and to not loose faith. Safety maintained and will continue to monitor.

## 2021-12-25 ENCOUNTER — Other Ambulatory Visit: Payer: Self-pay

## 2021-12-25 DIAGNOSIS — F200 Paranoid schizophrenia: Secondary | ICD-10-CM | POA: Diagnosis not present

## 2021-12-25 LAB — URINALYSIS, ROUTINE W REFLEX MICROSCOPIC
Bilirubin Urine: NEGATIVE
Glucose, UA: NEGATIVE mg/dL
Hgb urine dipstick: NEGATIVE
Ketones, ur: NEGATIVE mg/dL
Leukocytes,Ua: NEGATIVE
Nitrite: NEGATIVE
Protein, ur: NEGATIVE mg/dL
Specific Gravity, Urine: 1.006 (ref 1.005–1.030)
pH: 8 (ref 5.0–8.0)

## 2021-12-25 LAB — GLUCOSE, CAPILLARY
Glucose-Capillary: 106 mg/dL — ABNORMAL HIGH (ref 70–99)
Glucose-Capillary: 121 mg/dL — ABNORMAL HIGH (ref 70–99)
Glucose-Capillary: 103 mg/dL — ABNORMAL HIGH (ref 70–99)

## 2021-12-25 NOTE — ED Notes (Signed)
MHT performed CBG: 169

## 2021-12-25 NOTE — ED Notes (Signed)
Pt asleep in bed. Respirations even and unlabored. Monitoring for safety. 

## 2021-12-25 NOTE — ED Notes (Signed)
Patient has not slept well tonight. Patient has been up several times urinating. Patient states"I don't know why I'm peeing so much".  Patient denies pain or burring sensation only complains of frequency. Paulina Fusi, NP no new orders received.

## 2021-12-25 NOTE — ED Provider Notes (Signed)
Behavioral Health Progress Note  Date and Time: 12/25/2021 9:39 AM Name: Teresa Murillo MRN:  [id number]  HPI and UC Course: Teresa Murillo is a 61 y.o. female with a past psychiatric history of schizophrenia, aggressive behavior, and possible intellectual disability presenting to [facility] on 11/23/21 voluntarily as a walk in via [facility] with complaints that she was locked out of the group home. Patient has been dismissed from group home due to multiple elopements. Pt had already been discharged from this facility but had been living there temporarily while DSS was looking for new placement. Pt is boarding at [facility].   Completed an AID to Capacity Evaluation (ACE) with attending Dr. Dwyane Dee on 12/11/2021.  Please see media tab for full details.    Completed test with Teresa Harman, PhD: Wechsler Adult Intelligence Scale-4, Teresa Murillo achieved a full-scale IQ score of 73 and a percentile rank of 4 placing her in the borderline range of intellectual functioning (12/14/2021) Please see consult note from Teresa Harman, PhD on 12/14/2021   Subjective:  Patient was initially seen awake, laying in bed, no acute distress. Inquired about housing again. Reported to patient that team is still looking for placement. Patient stated she feels bored, otherwise good. She had no other questions or concerns.   QM:GNOIBBCW Thoughts: No (Contracted to safety) UG:QBVQXIHWT Thoughts: No UUE:[id number]: None Ideas of WPV:XYIA   Mood: Euthymic Sleep:Good Appetite: Good   Review of Systems  Respiratory:  Negative for shortness of breath.   Cardiovascular:  Negative for chest pain.  Gastrointestinal:  Negative for abdominal pain, constipation, diarrhea, nausea and vomiting.  Musculoskeletal:  Negative for myalgias.  Neurological:  Negative for dizziness and headaches.      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 On the Wechsler Adult Intelligence Scale-4, Teresa Murillo achieved a full-scale IQ score of 73 and a percentile rank of 4 placing her in the borderline range of intellectual functioning.    Chronic obstructive pulmonary disease (COPD) (HCC)    Glaucoma    Hyperlipidemia    Hypertension    Iron deficiency    Schizoaffective disorder (HCC)    Type 2 diabetes mellitus (HCC)     Past Surgical History:  Procedure Laterality Date   TUBAL LIGATION     Family History:  Family History  Problem Relation Age of Onset   Breast cancer Maternal Grandmother    Family Psychiatric  History: None reported Social History:  Social History   Substance and Sexual Activity  Alcohol Use Yes     Social History   Substance and Sexual Activity  Drug Use Not Currently    Social History   Socioeconomic History   Marital status: Divorced    Spouse name: Not on file   Number of children: Not on file   Years of education: Not on file   Highest education level: Not on file  Occupational History   Not on file  Tobacco Use   Smoking status: Every Day    Packs/day: 1.00    Types: Cigars, Cigarettes   Smokeless tobacco: Current  Vaping Use   Vaping Use: Never used  Substance and Sexual Activity  Alcohol use: Yes   Drug use: Not Currently   Sexual activity: Not Currently    Birth control/protection: Surgical  Other Topics Concern   Not on file  Social History Narrative   Not on file   Social Determinants of Health   Financial Resource Strain: Not on file  Food Insecurity: Not on file  Transportation Needs: Not on file  Physical Activity: Not on file  Stress: Not on file   Social Connections: Not on file   SDOH:  SDOH Screenings   Depression (PHQ2-9): Low Risk  (11/23/2021)  Tobacco Use: High Risk (12/14/2021)   Additional Social History:    Pain Medications: See MAR Prescriptions: See MAR Over the Counter: See MAR History of alcohol / drug use?: No history of alcohol / drug abuse Longest period of sobriety (when/how long): N/A                    Current Medications:  Current Facility-Administered Medications  Medication Dose Route Frequency Provider Last Rate Last Admin   acetaminophen (TYLENOL) tablet 650 mg  650 mg Oral Q6H PRN Rankin, Shuvon B, NP   650 mg at 12/16/21 2156   albuterol (VENTOLIN HFA) 108 (90 Base) MCG/ACT inhaler 2 puff  2 puff Inhalation Q6H PRN Rankin, Shuvon B, NP   2 puff at 12/09/21 2102   alum & mag hydroxide-simeth (MAALOX/MYLANTA) 200-200-20 MG/5ML suspension 30 mL  30 mL Oral Q4H PRN Rankin, Shuvon B, NP   30 mL at 12/19/21 2109   apixaban (ELIQUIS) tablet 5 mg  5 mg Oral BID Rankin, Shuvon B, NP   5 mg at 12/25/21 0811   cloZAPine (CLOZARIL) tablet 50 mg  50 mg Oral Daily [name], NP   50 mg at 12/25/21 0810   cloZAPine (CLOZARIL) tablet 75 mg  75 mg Oral Dorie Rank, NP   75 mg at 12/24/21 2130   diltiazem (CARDIZEM CD) 24 hr capsule 240 mg  240 mg Oral Daily Rankin, Shuvon B, NP   240 mg at 12/25/21 0811   divalproex (DEPAKOTE ER) 24 hr tablet 500 mg  500 mg Oral BID Rankin, Shuvon B, NP   500 mg at 12/25/21 0811   fluticasone furoate-vilanterol (BREO ELLIPTA) 200-25 MCG/ACT 1 puff  1 puff Inhalation Daily Rankin, Shuvon B, NP   1 puff at 12/25/21 0819   haloperidol (HALDOL) tablet 10 mg  10 mg Oral Q8H PRN Rankin, Shuvon B, NP       insulin aspart (novoLOG) injection 0-9 Units  0-9 Units Subcutaneous TID WC Tharon Aquas, NP   2 Units at 12/23/21 1657   insulin glargine-yfgn (SEMGLEE) injection 10 Units  10 Units Subcutaneous Daily [name], DO   10 Units at 12/24/21 0904   latanoprost  (XALATAN) 0.005 % ophthalmic solution 1 drop  1 drop Both Eyes QHS Rankin, Shuvon B, NP   1 drop at 12/24/21 2131   magnesium hydroxide (MILK OF MAGNESIA) suspension 30 mL  30 mL Oral Daily PRN Rankin, Shuvon B, NP       menthol-cetylpyridinium (CEPACOL) lozenge 3 mg  1 lozenge Oral Once [name], NP       menthol-cetylpyridinium (CEPACOL) lozenge 3 mg  1 lozenge Oral PRN [name], NP   3 mg at 12/25/21 0024   metFORMIN (GLUCOPHAGE-XR) 24 hr tablet 1,000 mg  1,000 mg Oral BID WC [name], DO   1,000 mg at 12/25/21 0810   nicotine (NICODERM CQ - dosed in  mg/24 hr) patch 7 mg  7 mg Transdermal Daily Rankin, Shuvon B, NP   7 mg at 12/25/21 9417   Vitamin D (Ergocalciferol) (DRISDOL) 1.25 MG (50000 UNIT) capsule 50,000 Units  50,000 Units Oral Q7 days Rankin, Shuvon B, NP   50,000 Units at 12/24/21 4081   Current Outpatient Medications  Medication Sig Dispense Refill   Accu-Chek Softclix Lancets lancets Use as directed up to four times daily 100 each 0   apixaban (ELIQUIS) 5 MG TABS tablet Take 1 tablet (5 mg total) by mouth 2 (two) times daily. 60 tablet 0   ARIPiprazole (ABILIFY) 10 MG tablet Take 1 tablet (10 mg total) by mouth daily. 30 tablet 0   Blood Glucose Monitoring Suppl (ACCU-CHEK GUIDE) w/Device KIT Use as directed up to four times daily 1 kit 0   budesonide-formoterol (SYMBICORT) 160-4.5 MCG/ACT inhaler Inhale 2 puffs into the lungs in the morning and at bedtime.     Cholecalciferol (VITAMIN D3) 1.25 MG (50000 UT) CAPS Take 50,000 Units by mouth every Thursday.     cloZAPine (CLOZARIL) 25 MG tablet Take 3 tablets (75 mg total) by mouth at bedtime. 90 tablet 0   clozapine (CLOZARIL) 50 MG tablet Take 1 tablet (50 mg total) by mouth daily. 30 tablet 0   diltiazem (CARDIZEM CD) 240 MG 24 hr capsule Take 1 capsule (240 mg total) by mouth daily. (Patient not taking: Reported on 11/24/2021) 30 capsule 0   divalproex (DEPAKOTE ER) 500 MG 24 hr tablet Take 1 tablet (500 mg total)  by mouth 2 (two) times daily. 60 tablet 0   glucose blood test strip Use as directed up to four times daily 50 each 0   haloperidol (HALDOL) 10 MG tablet Take 1 tablet (10 mg total) by mouth 3 (three) times daily as needed for agitation (and psychotic symptoms).     INGREZZA 40 MG capsule Take 1 capsule (40 mg total) by mouth in the morning. 30 capsule 0   insulin aspart (NOVOLOG) 100 UNIT/ML FlexPen Before each meal 3 times a day, 140-199 - 2 units, 200-250 - 4 units, 251-299 - 6 units,  300-349 - 8 units,  350 or above 10 units. Insulin PEN if approved, provide syringes and needles if needed.Please switch to any approved short acting Insulin if needed. 15 mL 0   insulin glargine (LANTUS) 100 UNIT/ML Solostar Pen Inject 12 Units into the skin daily. 15 mL 0   Insulin Pen Needle 32G X 4 MM MISC Use 4 times a day with insulin, 1 month supply. 100 each 0   latanoprost (XALATAN) 0.005 % ophthalmic solution Place 1 drop into both eyes at bedtime.     metFORMIN (GLUCOPHAGE) 500 MG tablet Take 500 mg by mouth 2 (two) times daily with a meal.     nicotine (NICODERM CQ - DOSED IN MG/24 HR) 7 mg/24hr patch Place 1 patch (7 mg total) onto the skin daily. 28 patch 0   PROAIR HFA 108 (90 Base) MCG/ACT inhaler Inhale 2 puffs into the lungs every 6 (six) hours as needed for wheezing or shortness of breath.      Labs  Lab Results:  No results displayed because visit has over 200 results.    Admission on 11/22/2021, Discharged on 11/22/2021  Component Date Value Ref Range Status   Glucose-Capillary 11/22/2021 169 (H)  70 - 99 mg/dL Final   Glucose reference range applies only to samples taken after fasting for at least 8 hours.  No results  displayed because visit has over 200 results.    Admission on 10/04/2021, Discharged on 10/22/2021  Component Date Value Ref Range Status   SARS Coronavirus 2 by RT PCR 10/04/2021 NEGATIVE  NEGATIVE Final   Comment: (NOTE) SARS-CoV-2 target nucleic acids are NOT  DETECTED.  The SARS-CoV-2 RNA is generally detectable in upper respiratory specimens during the acute phase of infection. The lowest concentration of SARS-CoV-2 viral copies this assay can detect is 138 copies/mL. A negative result does not preclude SARS-Cov-2 infection and should not be used as the sole basis for treatment or other patient management decisions. A negative result may occur with  improper specimen collection/handling, submission of specimen other than nasopharyngeal swab, presence of viral mutation(s) within the areas targeted by this assay, and inadequate number of viral copies(<138 copies/mL). A negative result must be combined with clinical observations, patient history, and epidemiological information. The expected result is Negative.  Fact Sheet for Patients:  EntrepreneurPulse.com.au  Fact Sheet for Healthcare Providers:  IncredibleEmployment.be  This test is no                          t yet approved or cleared by the [place] FDA and  has been authorized for detection and/or diagnosis of SARS-CoV-2 by FDA under an Emergency Use Authorization (EUA). This EUA will remain  in effect (meaning this test can be used) for the duration of the COVID-19 declaration under Section 564(b)(1) of the Act, 21 U.S.C.section 360bbb-3(b)(1), unless the authorization is terminated  or revoked sooner.       Influenza A by PCR 10/04/2021 NEGATIVE  NEGATIVE Final   Influenza B by PCR 10/04/2021 NEGATIVE  NEGATIVE Final   Comment: (NOTE) The Xpert Xpress SARS-CoV-2/FLU/RSV plus assay is intended as an aid in the diagnosis of influenza from Nasopharyngeal swab specimens and should not be used as a sole basis for treatment. Nasal washings and aspirates are unacceptable for Xpert Xpress SARS-CoV-2/FLU/RSV testing.  Fact Sheet for Patients: EntrepreneurPulse.com.au  Fact Sheet for Healthcare  Providers: IncredibleEmployment.be  This test is not yet approved or cleared by the [place] FDA and has been authorized for detection and/or diagnosis of SARS-CoV-2 by FDA under an Emergency Use Authorization (EUA). This EUA will remain in effect (meaning this test can be used) for the duration of the COVID-19 declaration under Section 564(b)(1) of the Act, 21 U.S.C. section 360bbb-3(b)(1), unless the authorization is terminated or revoked.  Performed at [facility], [place] [address] [place], [place] [address]    WBC 10/04/2021 8.4  4.0 - 10.5 K/uL Final   RBC 10/04/2021 4.42  3.87 - 5.11 MIL/uL Final   Hemoglobin 10/04/2021 11.6 (L)  12.0 - 15.0 g/dL Final   HCT 10/04/2021 35.7 (L)  36.0 - 46.0 % Final   MCV 10/04/2021 80.8  80.0 - 100.0 fL Final   MCH 10/04/2021 26.2  26.0 - 34.0 pg Final   MCHC 10/04/2021 32.5  30.0 - 36.0 g/dL Final   RDW 10/04/2021 16.0 (H)  11.5 - 15.5 % Final   Platelets 10/04/2021 372  150 - 400 K/uL Final   nRBC 10/04/2021 0.0  0.0 - 0.2 % Final   Neutrophils Relative % 10/04/2021 68  % Final   Neutro Abs 10/04/2021 5.7  1.7 - 7.7 K/uL Final   Lymphocytes Relative 10/04/2021 22  % Final   Lymphs Abs 10/04/2021 1.8  0.7 - 4.0 K/uL Final   Monocytes Relative 10/04/2021 8  %  Final   Monocytes Absolute 10/04/2021 0.7  0.1 - 1.0 K/uL Final   Eosinophils Relative 10/04/2021 1  % Final   Eosinophils Absolute 10/04/2021 0.1  0.0 - 0.5 K/uL Final   Basophils Relative 10/04/2021 1  % Final   Basophils Absolute 10/04/2021 0.1  0.0 - 0.1 K/uL Final   Immature Granulocytes 10/04/2021 0  % Final   Abs Immature Granulocytes 10/04/2021 0.03  0.00 - 0.07 K/uL Final   Performed at [facility], [place] [address], [place], [place] [address]   Sodium 10/04/2021 136  135 - 145 mmol/L Final   Potassium 10/04/2021 4.2  3.5 - 5.1 mmol/L Final   Chloride 10/04/2021 104  98 - 111 mmol/L Final   CO2 10/04/2021 25  22 - 32 mmol/L Final    Glucose, Bld 10/04/2021 100 (H)  70 - 99 mg/dL Final   Glucose reference range applies only to samples taken after fasting for at least 8 hours.   BUN 10/04/2021 9  6 - 20 mg/dL Final   Creatinine, Ser 10/04/2021 0.52  0.44 - 1.00 mg/dL Final   Calcium 10/04/2021 9.0  8.9 - 10.3 mg/dL Final   Total Protein 10/04/2021 7.0  6.5 - 8.1 g/dL Final   Albumin 10/04/2021 3.2 (L)  3.5 - 5.0 g/dL Final   AST 10/04/2021 13 (L)  15 - 41 U/L Final   ALT 10/04/2021 10  0 - 44 U/L Final   Alkaline Phosphatase 10/04/2021 61  38 - 126 U/L Final   Total Bilirubin 10/04/2021 0.3  0.3 - 1.2 mg/dL Final   GFR, Estimated 10/04/2021 >60  >60 mL/min Final   Comment: (NOTE) Calculated using the CKD-EPI Creatinine Equation (2021)    Anion gap 10/04/2021 7  5 - 15 Final   Performed at [place] [address], [place], [place] [address]   Hgb A1c MFr Bld 10/04/2021 7.0 (H)  4.8 - 5.6 % Final   Comment: (NOTE) Pre diabetes:          5.7%-6.4%  Diabetes:              >6.4%  Glycemic control for   <7.0% adults with diabetes    Mean Plasma Glucose 10/04/2021 154.2  mg/dL Final   Performed at [facility], [place] [address] [place], [place] [address]   Cholesterol 10/04/2021 178  0 - 200 mg/dL Final   Triglycerides 10/04/2021 155 (H)  <150 mg/dL Final   HDL 10/04/2021 52  >40 mg/dL Final   Total CHOL/HDL Ratio 10/04/2021 3.4  RATIO Final   VLDL 10/04/2021 31  0 - 40 mg/dL Final   LDL Cholesterol 10/04/2021 95  0 - 99 mg/dL Final   Comment:        Total Cholesterol/HDL:CHD Risk Coronary Heart Disease Risk Table                     Men   Women  1/2 Average Risk   3.4   3.3  Average Risk       5.0   4.4  2 X Average Risk   9.6   7.1  3 X Average Risk  23.4   11.0        Use the calculated Patient Ratio above and the CHD Risk Table to determine the patient's CHD Risk.        ATP III CLASSIFICATION (LDL):  <100     mg/dL   Optimal  100-129  mg/dL   Near or Above                     Optimal  130-159  mg/dL   Borderline  160-189  mg/dL   High  >190     mg/dL   Very High Performed at [place] [address] [place], [place] [address]    POC Amphetamine UR 10/04/2021 None Detected  NONE DETECTED (Cut Off Level 1000 ng/mL) Final   POC Secobarbital (BAR) 10/04/2021 None Detected  NONE DETECTED (Cut Off Level 300 ng/mL) Final   POC Buprenorphine (BUP) 10/04/2021 None Detected  NONE DETECTED (Cut Off Level 10 ng/mL) Final   POC Oxazepam (BZO) 10/04/2021 None Detected  NONE DETECTED (Cut Off Level 300 ng/mL) Final   POC Cocaine UR 10/04/2021 None Detected  NONE DETECTED (Cut Off Level 300 ng/mL) Final   POC Methamphetamine UR 10/04/2021 None Detected  NONE DETECTED (Cut Off Level 1000 ng/mL) Final   POC Morphine 10/04/2021 None Detected  NONE DETECTED (Cut Off Level 300 ng/mL) Final   POC Methadone UR 10/04/2021 None Detected  NONE DETECTED (Cut Off Level 300 ng/mL) Final   POC Oxycodone UR 10/04/2021 Positive (A)  NONE DETECTED (Cut Off Level 100 ng/mL) Final   POC Marijuana UR 10/04/2021 None Detected  NONE DETECTED (Cut Off Level 50 ng/mL) Final   SARSCOV2ONAVIRUS 2 AG 10/04/2021 NEGATIVE  NEGATIVE Final   Comment: (NOTE) SARS-CoV-2 antigen NOT DETECTED.   Negative results are presumptive.  Negative results do not preclude SARS-CoV-2 infection and should not be used as the sole basis for treatment or other patient management decisions, including infection  control decisions, particularly in the presence of clinical signs and  symptoms consistent with COVID-19, or in those who have been in contact with the virus.  Negative results must be combined with clinical observations, patient history, and epidemiological information. The expected result is Negative.  Fact Sheet for Patients: HandmadeRecipes.com.cy  Fact Sheet for Healthcare Providers: FuneralLife.at  This test is not yet approved or cleared by the  [place] FDA and  has been authorized for detection and/or diagnosis of SARS-CoV-2 by FDA under an Emergency Use Authorization (EUA).  This EUA will remain in effect (meaning this test can be used) for the duration of  the COV                          ID-19 declaration under Section 564(b)(1) of the Act, 21 U.S.C. section 360bbb-3(b)(1), unless the authorization is terminated or revoked sooner.     Valproic Acid Lvl 10/04/2021 51  50.0 - 100.0 ug/mL Final   Performed at [place] [address], [place], [place] [address]   Valproic Acid Lvl 10/08/2021 57  50.0 - 100.0 ug/mL Final   Performed at [place] [address] [place], [place] [address]   Glucose-Capillary 10/09/2021 123 (H)  70 - 99 mg/dL Final   Glucose reference range applies only to samples taken after fasting for at least 8 hours.  Admission on 09/09/2021, Discharged on 09/10/2021  Component Date Value Ref Range Status   Sodium 09/09/2021 134 (L)  135 - 145 mmol/L Final   Potassium 09/09/2021 4.3  3.5 - 5.1 mmol/L Final   Chloride 09/09/2021 99  98 - 111 mmol/L Final   CO2 09/09/2021 25  22 - 32 mmol/L Final   Glucose, Bld 09/09/2021 107 (H)  70 - 99  mg/dL Final   Glucose reference range applies only to samples taken after fasting for at least 8 hours.   BUN 09/09/2021 12  6 - 20 mg/dL Final   Creatinine, Ser 09/09/2021 0.74  0.44 - 1.00 mg/dL Final   Calcium 09/09/2021 9.0  8.9 - 10.3 mg/dL Final   Total Protein 09/09/2021 7.4  6.5 - 8.1 g/dL Final   Albumin 09/09/2021 3.1 (L)  3.5 - 5.0 g/dL Final   AST 09/09/2021 13 (L)  15 - 41 U/L Final   ALT 09/09/2021 13  0 - 44 U/L Final   Alkaline Phosphatase 09/09/2021 66  38 - 126 U/L Final   Total Bilirubin 09/09/2021 0.2 (L)  0.3 - 1.2 mg/dL Final   GFR, Estimated 09/09/2021 >60  >60 mL/min Final   Comment: (NOTE) Calculated using the CKD-EPI Creatinine Equation (2021)    Anion gap 09/09/2021 10  5 - 15 Final   Performed at [facility], [place] [address], [place], [place] [address]   WBC 09/09/2021 8.5  4.0 - 10.5 K/uL Final   RBC 09/09/2021 4.53  3.87 - 5.11 MIL/uL Final   Hemoglobin 09/09/2021 11.8 (L)  12.0 - 15.0 g/dL Final   HCT 09/09/2021 38.0  36.0 - 46.0 % Final   MCV 09/09/2021 83.9  80.0 - 100.0 fL Final   MCH 09/09/2021 26.0  26.0 - 34.0 pg Final   MCHC 09/09/2021 31.1  30.0 - 36.0 g/dL Final   RDW 09/09/2021 17.8 (H)  11.5 - 15.5 % Final   Platelets 09/09/2021 442 (H)  150 - 400 K/uL Final   nRBC 09/09/2021 0.0  0.0 - 0.2 % Final   Neutrophils Relative % 09/09/2021 70  % Final   Neutro Abs 09/09/2021 5.9  1.7 - 7.7 K/uL Final   Lymphocytes Relative 09/09/2021 20  % Final   Lymphs Abs 09/09/2021 1.7  0.7 - 4.0 K/uL Final   Monocytes Relative 09/09/2021 8  % Final   Monocytes Absolute 09/09/2021 0.7  0.1 - 1.0 K/uL Final   Eosinophils Relative 09/09/2021 1  % Final   Eosinophils Absolute 09/09/2021 0.1  0.0 - 0.5 K/uL Final   Basophils Relative 09/09/2021 1  % Final   Basophils Absolute 09/09/2021 0.1  0.0 - 0.1 K/uL Final   Immature Granulocytes 09/09/2021 0  % Final   Abs Immature Granulocytes 09/09/2021 0.03  0.00 - 0.07 K/uL Final   Performed at [facility], [place] [address] [place], [place] [address]   Ammonia 09/09/2021 37 (H)  9 - 35 umol/L Final   Comment: HEMOLYSIS AT THIS LEVEL MAY AFFECT RESULT Performed at [facility], [place] [address] [place], [place] [address]    Opiates 09/09/2021 NONE DETECTED  NONE DETECTED Final   Cocaine 09/09/2021 NONE DETECTED  NONE DETECTED Final   Benzodiazepines 09/09/2021 NONE DETECTED  NONE DETECTED Final   Amphetamines 09/09/2021 NONE DETECTED  NONE DETECTED Final   Tetrahydrocannabinol 09/09/2021 NONE DETECTED  NONE DETECTED Final   Barbiturates 09/09/2021 NONE DETECTED  NONE DETECTED Final   Comment: (NOTE) DRUG SCREEN FOR MEDICAL PURPOSES ONLY.  IF CONFIRMATION IS NEEDED FOR ANY PURPOSE, NOTIFY LAB WITHIN 5 DAYS.  LOWEST DETECTABLE  LIMITS FOR URINE DRUG SCREEN Drug Class                     Cutoff (ng/mL) Amphetamine and metabolites    1000 Barbiturate and metabolites    200 Benzodiazepine  841 Tricyclics and metabolites     300 Opiates and metabolites        300 Cocaine and metabolites        300 THC                            50 Performed at [facility], [place] [address], [place], [place] [address]    Alcohol, Ethyl (B) 09/09/2021 <10  <10 mg/dL Final   Comment: (NOTE) Lowest detectable limit for serum alcohol is 10 mg/dL.  For medical purposes only. Performed at [facility], [place] [address], [place], [place] [address]    Lipase 09/09/2021 33  11 - 51 U/L Final   Performed at [place] [address], [place], [place] [address]   Color, Urine 09/09/2021 COLORLESS (A)  YELLOW Final   APPearance 09/09/2021 CLEAR  CLEAR Final   Specific Gravity, Urine 09/09/2021 1.002 (L)  1.005 - 1.030 Final   pH 09/09/2021 6.0  5.0 - 8.0 Final   Glucose, UA 09/09/2021 NEGATIVE  NEGATIVE mg/dL Final   Hgb urine dipstick 09/09/2021 NEGATIVE  NEGATIVE Final   Bilirubin Urine 09/09/2021 NEGATIVE  NEGATIVE Final   Ketones, ur 09/09/2021 NEGATIVE  NEGATIVE mg/dL Final   Protein, ur 09/09/2021 NEGATIVE  NEGATIVE mg/dL Final   Nitrite 09/09/2021 NEGATIVE  NEGATIVE Final   Leukocytes,Ua 09/09/2021 NEGATIVE  NEGATIVE Final   Performed at [place] [address] [place], [place] [address]   Specimen Description 09/09/2021 URINE, CLEAN CATCH   Final   Special Requests 09/09/2021 NONE   Final   Culture 09/09/2021  (A)   Final                   Value:<10,000 COLONIES/mL INSIGNIFICANT GROWTH Performed at [facility], [place] [address], [place], [place] [address]    Report Status 09/09/2021 09/10/2021 FINAL   Final   D-Dimer, Quant 09/09/2021 0.92 (H)  0.00 - 0.50 ug/mL-FEU Final   Comment: (NOTE) At the manufacturer cut-off value of 0.5 g/mL FEU, this assay has a negative  predictive value of 95-100%.This assay is intended for use in conjunction with a clinical pretest probability (PTP) assessment model to exclude pulmonary embolism (PE) and deep venous thrombosis (DVT) in outpatients suspected of PE or DVT. Results should be correlated with clinical presentation. Performed at [facility], [place] [address], [place], [place] [address]    Troponin I (High Sensitivity) 09/09/2021 5  <18 ng/L Final   Comment: (NOTE) Elevated high sensitivity troponin I (hsTnI) values and significant  changes across serial measurements may suggest ACS but many other  chronic and acute conditions are known to elevate hsTnI results.  Refer to the "Links" section for chest pain algorithms and additional  guidance. Performed at [facility], [place] [address], [place], [place] [address]    BP 09/10/2021 135/83  mmHg Final   S' Lateral 09/10/2021 2.30  cm Final   Area-P 1/2 09/10/2021 5.58  cm2 Final    Blood Alcohol level:  Lab Results  Component Value Date   ETH <10 11/23/2021   ETH <10 95/18/8416    Metabolic Disorder Labs: Lab Results  Component Value Date   HGBA1C 7.0 (H) 10/04/2021   MPG 154.2 10/04/2021   Lab Results  Component Value Date   PROLACTIN 3.8 (L) 11/23/2021   Lab Results  Component Value Date   CHOL  171 11/23/2021   TRIG 125 11/23/2021   HDL 65 11/23/2021   CHOLHDL 2.6 11/23/2021   VLDL 25 11/23/2021   LDLCALC 81 11/23/2021   LDLCALC 95 10/04/2021    Therapeutic Lab Levels: No results found for: "LITHIUM" Lab Results  Component Value Date   VALPROATE 54 12/03/2021   VALPROATE 45 (L) 11/09/2021   No results found for: "CBMZ"  Physical Findings   PHQ2-9    [place] ED from 11/23/2021 in [facility]  PHQ-2 Total Score 2  PHQ-9 Total Score 3      Flowsheet Row ED from 11/23/2021 in [facility] Most recent reading at 11/23/2021  6:44 PM ED from 11/22/2021 in  [place] [facility] Most recent reading at 11/22/2021  8:28 PM ED from 11/22/2021 in [place] [facility] Most recent reading at 11/22/2021  1:24 AM  C-SSRS RISK CATEGORY No Risk No Risk No Risk        Musculoskeletal  Strength & Muscle Tone: within normal limits Gait & Station: normal Patient leans: N/A  Psychiatric Specialty Exam  Presentation  General Appearance:  Appropriate for Environment; Casual; Fairly Groomed  Eye Contact: Good  Speech: Clear and Coherent; Normal Rate  Speech Volume: Normal  Handedness: Left   Mood and Affect  Mood: Euthymic  Affect: Appropriate; Congruent; Full Range   Thought Process  Thought Processes: Coherent; Goal Directed; Linear  Descriptions of Associations:Intact  Orientation:Full (Time, Place and Person)  Thought Content:WDL  Diagnosis of Schizophrenia or Schizoaffective disorder in past: Yes  Duration of Psychotic Symptoms: NA  Hallucinations:Denied AVH Ideas of Reference:None  Suicidal Thoughts:Denied SI Homicidal Thoughts:Denied HI  Sensorium  Memory: Immediate Good  Judgment: Fair  Insight: Fair   Community education officer  Concentration: Good  Attention Span: Good  Recall: Good  Fund of Knowledge: Good  Language: Good   Psychomotor Activity  Psychomotor Activity:Normal. AIMS 0  Assets  Assets: Communication Skills; Desire for Improvement   Sleep  Sleep:Good   Physical Exam  Physical Exam Vitals and nursing note reviewed.  Constitutional:      General: She is not in acute distress.    Appearance: She is not ill-appearing or diaphoretic.  HENT:     Head: Normocephalic and atraumatic.  Pulmonary:     Effort: Pulmonary effort is normal. No respiratory distress.  Neurological:     General: No focal deficit present.     Mental Status: She is alert.     Blood pressure 129/82, pulse (!) 103, temperature 98 F (36.7 C), temperature  source Oral, resp. rate 18, SpO2 100 %. There is no height or weight on file to calculate BMI.  Treatment Plan Summary: Riann remains cleared by psychiatry.   Facility [facility] team along with [facility] health care manager and case manager continue to seek placement for [place].   Dx: SCZ, borderline intellectual functioning, DM (insulin dependent), HLD, COPD, glaucoma, tobacco use d/o   Appreciate pharmacy for assistance with clozapine labs -  weekly ANC monitoring   Continue home meds per below. apixaban, 5 mg, BID clozapine, 50 mg, Daily cloZAPine, 75 mg, QHS diltiazem, 240 mg, Daily divalproex, 500 mg, BID fluticasone furoate-vilanterol, 1 puff, Daily insulin aspart, 0-9 Units, TID WC insulin glargine-yfgn, 10 Units, Daily latanoprost, 1 drop, QHS menthol-cetylpyridinium, 1 lozenge, Once metFORMIN, 1,000 mg, BID WC nicotine, 7 mg, Daily Vitamin D (Ergocalciferol), 50,000 Units, Q7 days    Dispo: Tentative Date: TBA Location: TBA Barrier: Pending  accepting facility with nursing capabilities to assist patient with administering insulin  Signed: [name], DO Psychiatry Resident, PGY-2 [facility]  12/25/2021, 9:39 AM

## 2021-12-25 NOTE — ED Notes (Signed)
Pt resting on bed watching TV and appropriately interacting with peers. Pt A&O x4, calm and cooperative. Denies current SI/HI/AVH. Pt denies any current needs. No signs of distress noted. Monitoring for safety.

## 2021-12-25 NOTE — ED Notes (Signed)
Patient resting quietly in bed with eyes closed. Respirations equal and unlabored, skin warm and dry, NAD. Routine safety checks conducted according to facility protocol. Will continue to monitor for safety.  

## 2021-12-25 NOTE — ED Notes (Signed)
Order received for urinalysis. Urine collected and dash contacted for pick up.

## 2021-12-25 NOTE — ED Notes (Signed)
Patient sleepy this morning. Denies SI/HI/AVH. Reports she didn't sleep well due to being up and down all night having to go urinate. UA ordered.

## 2021-12-26 DIAGNOSIS — F200 Paranoid schizophrenia: Secondary | ICD-10-CM | POA: Diagnosis not present

## 2021-12-26 LAB — GLUCOSE, CAPILLARY
Glucose-Capillary: 97 mg/dL (ref 70–99)
Glucose-Capillary: 188 mg/dL — ABNORMAL HIGH (ref 70–99)
Glucose-Capillary: 123 mg/dL — ABNORMAL HIGH (ref 70–99)
Glucose-Capillary: 145 mg/dL — ABNORMAL HIGH (ref 70–99)

## 2021-12-26 NOTE — ED Provider Notes (Signed)
Behavioral Health Progress Note  Date and Time: 12/26/2021 11:43 AM Name: Teresa Murillo MRN:  [id number]  Subjective:   Teresa Murillo is a 61 y.o. female with a past psychiatric history of schizophrenia, aggressive behavior, and possible intellectual disability presenting to [facility] on 11/23/21 voluntarily as a walk in via [facility] with complaints that she was locked out of the group home. Patient has been dismissed from group home due to multiple elopements. Pt had already been discharged from this facility but had been living there temporarily while DSS was looking for new placement. Pt is boarding at [facility].   AID to Capacity Evaluation (ACE) completed by Dr. Louis Meckel, Dr. Dwyane Dee on 12/11/2021.  Please see media tab for full details.    [name], PhD completed: Wechsler Adult Intelligence Scale-4, Teresa Murillo achieved a full-scale IQ score of 73 and a percentile rank of 4 placing her in the borderline range of intellectual functioning (12/14/2021) Please see consult note from [name], PhD on 12/14/2021  On reassessment, pt is a&ox3, in no acute distress, non toxic appearing. Pt denies SI/VI/HI, AVH, paranoia. She denies any physical or psychiatric complaints. Pt remains psychiatrically cleared.   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 On the Wechsler Adult Intelligence Scale-4, [name] achieved a full-scale IQ score of 73 and a percentile rank of 4 placing her in the borderline range of intellectual functioning.    Chronic obstructive pulmonary disease (COPD) (HCC)     Glaucoma    Hyperlipidemia    Hypertension    Iron deficiency    Schizoaffective disorder (HCC)    Type 2 diabetes mellitus (HCC)     Past Surgical History:  Procedure Laterality Date   TUBAL LIGATION     Family History:  Family History  Problem Relation Age of Onset   Breast cancer Maternal Grandmother    Family Psychiatric  History: None reported Social History:  Social History   Substance and Sexual Activity  Alcohol Use Yes     Social History   Substance and Sexual Activity  Drug Use Not Currently    Social History   Socioeconomic History   Marital status: Divorced    Spouse name: Not on file   Number of children: Not on file   Years of education: Not on file   Highest education level: Not on file  Occupational History   Not on file  Tobacco Use   Smoking status: Every Day    Packs/day: 1.00    Types: Cigars, Cigarettes   Smokeless tobacco: Current  Vaping Use   Vaping Use: Never used  Substance and Sexual Activity   Alcohol use: Yes   Drug use: Not Currently   Sexual activity: Not Currently    Birth control/protection: Surgical  Other Topics Concern   Not on file  Social History Narrative   Not on file   Social Determinants of Health   Financial Resource Strain: Not on file  Food Insecurity: Not on file  Transportation Needs: Not on file  Physical Activity: Not on file  Stress: Not on file  Social Connections: Not on file   SDOH:  SDOH Screenings  Depression (PHQ2-9): Low Risk  (11/23/2021)  Tobacco Use: High Risk (12/14/2021)   Additional Social History:    Pain Medications: See MAR Prescriptions: See MAR Over the Counter: See MAR History of alcohol / drug use?: No history of alcohol / drug abuse Longest period of sobriety (when/how long): N/A                    Sleep: Good  Appetite:  Good  Current Medications:  Current Facility-Administered Medications  Medication Dose Route Frequency Provider Last Rate Last Admin    acetaminophen (TYLENOL) tablet 650 mg  650 mg Oral Q6H PRN Rankin, Shuvon B, NP   650 mg at 12/25/21 2200   albuterol (VENTOLIN HFA) 108 (90 Base) MCG/ACT inhaler 2 puff  2 puff Inhalation Q6H PRN Rankin, Shuvon B, NP   2 puff at 12/09/21 2102   alum & mag hydroxide-simeth (MAALOX/MYLANTA) 200-200-20 MG/5ML suspension 30 mL  30 mL Oral Q4H PRN Rankin, Shuvon B, NP   30 mL at 12/19/21 2109   apixaban (ELIQUIS) tablet 5 mg  5 mg Oral BID Rankin, Shuvon B, NP   5 mg at 12/26/21 0957   cloZAPine (CLOZARIL) tablet 50 mg  50 mg Oral Daily [name], NP   50 mg at 12/26/21 0957   cloZAPine (CLOZARIL) tablet 75 mg  75 mg Oral QHS [name], NP   75 mg at 12/25/21 2200   diltiazem (CARDIZEM CD) 24 hr capsule 240 mg  240 mg Oral Daily Rankin, Shuvon B, NP   240 mg at 12/26/21 0957   divalproex (DEPAKOTE ER) 24 hr tablet 500 mg  500 mg Oral BID Rankin, Shuvon B, NP   500 mg at 12/26/21 0956   fluticasone furoate-vilanterol (BREO ELLIPTA) 200-25 MCG/ACT 1 puff  1 puff Inhalation Daily Rankin, Shuvon B, NP   1 puff at 12/26/21 0833   haloperidol (HALDOL) tablet 10 mg  10 mg Oral Q8H PRN Rankin, Shuvon B, NP       insulin aspart (novoLOG) injection 0-9 Units  0-9 Units Subcutaneous TID WC Tharon Aquas, NP   2 Units at 12/23/21 1657   insulin glargine-yfgn (SEMGLEE) injection 10 Units  10 Units Subcutaneous Daily [name], DO   10 Units at 12/26/21 0957   latanoprost (XALATAN) 0.005 % ophthalmic solution 1 drop  1 drop Both Eyes QHS Rankin, Shuvon B, NP   1 drop at 12/25/21 2201   magnesium hydroxide (MILK OF MAGNESIA) suspension 30 mL  30 mL Oral Daily PRN Rankin, Shuvon B, NP       menthol-cetylpyridinium (CEPACOL) lozenge 3 mg  1 lozenge Oral Once [name], NP       menthol-cetylpyridinium (CEPACOL) lozenge 3 mg  1 lozenge Oral PRN [name], NP   3 mg at 12/25/21 0024   metFORMIN (GLUCOPHAGE-XR) 24 hr tablet 1,000 mg  1,000 mg Oral BID WC [name], DO   1,000 mg at 12/26/21  1975   nicotine (NICODERM CQ - dosed in mg/24 hr) patch 7 mg  7 mg Transdermal Daily Rankin, Shuvon B, NP   7 mg at 12/26/21 8832   Vitamin D (Ergocalciferol) (DRISDOL) 1.25 MG (50000 UNIT) capsule 50,000 Units  50,000 Units Oral Q7 days Rankin, Shuvon B, NP   50,000 Units at 12/24/21 0905   Current Outpatient Medications  Medication Sig Dispense Refill   Accu-Chek Softclix Lancets lancets Use as directed up to four times daily 100 each 0   apixaban (ELIQUIS) 5  MG TABS tablet Take 1 tablet (5 mg total) by mouth 2 (two) times daily. 60 tablet 0   ARIPiprazole (ABILIFY) 10 MG tablet Take 1 tablet (10 mg total) by mouth daily. 30 tablet 0   Blood Glucose Monitoring Suppl (ACCU-CHEK GUIDE) w/Device KIT Use as directed up to four times daily 1 kit 0   budesonide-formoterol (SYMBICORT) 160-4.5 MCG/ACT inhaler Inhale 2 puffs into the lungs in the morning and at bedtime.     Cholecalciferol (VITAMIN D3) 1.25 MG (50000 UT) CAPS Take 50,000 Units by mouth every Thursday.     cloZAPine (CLOZARIL) 25 MG tablet Take 3 tablets (75 mg total) by mouth at bedtime. 90 tablet 0   clozapine (CLOZARIL) 50 MG tablet Take 1 tablet (50 mg total) by mouth daily. 30 tablet 0   diltiazem (CARDIZEM CD) 240 MG 24 hr capsule Take 1 capsule (240 mg total) by mouth daily. (Patient not taking: Reported on 11/24/2021) 30 capsule 0   divalproex (DEPAKOTE ER) 500 MG 24 hr tablet Take 1 tablet (500 mg total) by mouth 2 (two) times daily. 60 tablet 0   glucose blood test strip Use as directed up to four times daily 50 each 0   haloperidol (HALDOL) 10 MG tablet Take 1 tablet (10 mg total) by mouth 3 (three) times daily as needed for agitation (and psychotic symptoms).     INGREZZA 40 MG capsule Take 1 capsule (40 mg total) by mouth in the morning. 30 capsule 0   insulin aspart (NOVOLOG) 100 UNIT/ML FlexPen Before each meal 3 times a day, 140-199 - 2 units, 200-250 - 4 units, 251-299 - 6 units,  300-349 - 8 units,  350 or above 10  units. Insulin PEN if approved, provide syringes and needles if needed.Please switch to any approved short acting Insulin if needed. 15 mL 0   insulin glargine (LANTUS) 100 UNIT/ML Solostar Pen Inject 12 Units into the skin daily. 15 mL 0   Insulin Pen Needle 32G X 4 MM MISC Use 4 times a day with insulin, 1 month supply. 100 each 0   latanoprost (XALATAN) 0.005 % ophthalmic solution Place 1 drop into both eyes at bedtime.     metFORMIN (GLUCOPHAGE) 500 MG tablet Take 500 mg by mouth 2 (two) times daily with a meal.     nicotine (NICODERM CQ - DOSED IN MG/24 HR) 7 mg/24hr patch Place 1 patch (7 mg total) onto the skin daily. 28 patch 0   PROAIR HFA 108 (90 Base) MCG/ACT inhaler Inhale 2 puffs into the lungs every 6 (six) hours as needed for wheezing or shortness of breath.      Labs  Lab Results:  No results displayed because visit has over 200 results.    Admission on 11/22/2021, Discharged on 11/22/2021  Component Date Value Ref Range Status   Glucose-Capillary 11/22/2021 169 (H)  70 - 99 mg/dL Final   Glucose reference range applies only to samples taken after fasting for at least 8 hours.  No results displayed because visit has over 200 results.    Admission on 10/04/2021, Discharged on 10/22/2021  Component Date Value Ref Range Status   SARS Coronavirus 2 by RT PCR 10/04/2021 NEGATIVE  NEGATIVE Final   Comment: (NOTE) SARS-CoV-2 target nucleic acids are NOT DETECTED.  The SARS-CoV-2 RNA is generally detectable in upper respiratory specimens during the acute phase of infection. The lowest concentration of SARS-CoV-2 viral copies this assay can detect is 138 copies/mL. A negative result does not  preclude SARS-Cov-2 infection and should not be used as the sole basis for treatment or other patient management decisions. A negative result may occur with  improper specimen collection/handling, submission of specimen other than nasopharyngeal swab, presence of viral mutation(s) within  the areas targeted by this assay, and inadequate number of viral copies(<138 copies/mL). A negative result must be combined with clinical observations, patient history, and epidemiological information. The expected result is Negative.  Fact Sheet for Patients:  EntrepreneurPulse.com.au  Fact Sheet for Healthcare Providers:  IncredibleEmployment.be  This test is no                          t yet approved or cleared by the [place] FDA and  has been authorized for detection and/or diagnosis of SARS-CoV-2 by FDA under an Emergency Use Authorization (EUA). This EUA will remain  in effect (meaning this test can be used) for the duration of the COVID-19 declaration under Section 564(b)(1) of the Act, 21 U.S.C.section 360bbb-3(b)(1), unless the authorization is terminated  or revoked sooner.       Influenza A by PCR 10/04/2021 NEGATIVE  NEGATIVE Final   Influenza B by PCR 10/04/2021 NEGATIVE  NEGATIVE Final   Comment: (NOTE) The Xpert Xpress SARS-CoV-2/FLU/RSV plus assay is intended as an aid in the diagnosis of influenza from Nasopharyngeal swab specimens and should not be used as a sole basis for treatment. Nasal washings and aspirates are unacceptable for Xpert Xpress SARS-CoV-2/FLU/RSV testing.  Fact Sheet for Patients: EntrepreneurPulse.com.au  Fact Sheet for Healthcare Providers: IncredibleEmployment.be  This test is not yet approved or cleared by the [place] FDA and has been authorized for detection and/or diagnosis of SARS-CoV-2 by FDA under an Emergency Use Authorization (EUA). This EUA will remain in effect (meaning this test can be used) for the duration of the COVID-19 declaration under Section 564(b)(1) of the Act, 21 U.S.C. section 360bbb-3(b)(1), unless the authorization is terminated or revoked.  Performed at [facility], [place] [address], [place], [place] [address]    WBC  10/04/2021 8.4  4.0 - 10.5 K/uL Final   RBC 10/04/2021 4.42  3.87 - 5.11 MIL/uL Final   Hemoglobin 10/04/2021 11.6 (L)  12.0 - 15.0 g/dL Final   HCT 10/04/2021 35.7 (L)  36.0 - 46.0 % Final   MCV 10/04/2021 80.8  80.0 - 100.0 fL Final   MCH 10/04/2021 26.2  26.0 - 34.0 pg Final   MCHC 10/04/2021 32.5  30.0 - 36.0 g/dL Final   RDW 10/04/2021 16.0 (H)  11.5 - 15.5 % Final   Platelets 10/04/2021 372  150 - 400 K/uL Final   nRBC 10/04/2021 0.0  0.0 - 0.2 % Final   Neutrophils Relative % 10/04/2021 68  % Final   Neutro Abs 10/04/2021 5.7  1.7 - 7.7 K/uL Final   Lymphocytes Relative 10/04/2021 22  % Final   Lymphs Abs 10/04/2021 1.8  0.7 - 4.0 K/uL Final   Monocytes Relative 10/04/2021 8  % Final   Monocytes Absolute 10/04/2021 0.7  0.1 - 1.0 K/uL Final   Eosinophils Relative 10/04/2021 1  % Final   Eosinophils Absolute 10/04/2021 0.1  0.0 - 0.5 K/uL Final   Basophils Relative 10/04/2021 1  % Final   Basophils Absolute 10/04/2021 0.1  0.0 - 0.1 K/uL Final   Immature Granulocytes 10/04/2021 0  % Final   Abs Immature Granulocytes 10/04/2021 0.03  0.00 - 0.07 K/uL Final   Performed at  [facility], [place] [address], [place], [place] [address]   Sodium 10/04/2021 136  135 - 145 mmol/L Final   Potassium 10/04/2021 4.2  3.5 - 5.1 mmol/L Final   Chloride 10/04/2021 104  98 - 111 mmol/L Final   CO2 10/04/2021 25  22 - 32 mmol/L Final   Glucose, Bld 10/04/2021 100 (H)  70 - 99 mg/dL Final   Glucose reference range applies only to samples taken after fasting for at least 8 hours.   BUN 10/04/2021 9  6 - 20 mg/dL Final   Creatinine, Ser 10/04/2021 0.52  0.44 - 1.00 mg/dL Final   Calcium 10/04/2021 9.0  8.9 - 10.3 mg/dL Final   Total Protein 10/04/2021 7.0  6.5 - 8.1 g/dL Final   Albumin 10/04/2021 3.2 (L)  3.5 - 5.0 g/dL Final   AST 10/04/2021 13 (L)  15 - 41 U/L Final   ALT 10/04/2021 10  0 - 44 U/L Final   Alkaline Phosphatase 10/04/2021 61  38 - 126 U/L Final   Total Bilirubin 10/04/2021  0.3  0.3 - 1.2 mg/dL Final   GFR, Estimated 10/04/2021 >60  >60 mL/min Final   Comment: (NOTE) Calculated using the CKD-EPI Creatinine Equation (2021)    Anion gap 10/04/2021 7  5 - 15 Final   Performed at [facility] [address] [place], [place] [address]   Hgb A1c MFr Bld 10/04/2021 7.0 (H)  4.8 - 5.6 % Final   Comment: (NOTE) Pre diabetes:          5.7%-6.4%  Diabetes:              >6.4%  Glycemic control for   <7.0% adults with diabetes    Mean Plasma Glucose 10/04/2021 154.2  mg/dL Final   Performed at [facility], [place] [address], [place], [place] [address]   Cholesterol 10/04/2021 178  0 - 200 mg/dL Final   Triglycerides 10/04/2021 155 (H)  <150 mg/dL Final   HDL 10/04/2021 52  >40 mg/dL Final   Total CHOL/HDL Ratio 10/04/2021 3.4  RATIO Final   VLDL 10/04/2021 31  0 - 40 mg/dL Final   LDL Cholesterol 10/04/2021 95  0 - 99 mg/dL Final   Comment:        Total Cholesterol/HDL:CHD Risk Coronary Heart Disease Risk Table                     Men   Women  1/2 Average Risk   3.4   3.3  Average Risk       5.0   4.4  2 X Average Risk   9.6   7.1  3 X Average Risk  23.4   11.0        Use the calculated Patient Ratio above and the CHD Risk Table to determine the patient's CHD Risk.        ATP III CLASSIFICATION (LDL):  <100     mg/dL   Optimal  100-129  mg/dL   Near or Above                    Optimal  130-159  mg/dL   Borderline  160-189  mg/dL   High  >190     mg/dL   Very High Performed at [facility] [address], [place], [place] [address]    POC Amphetamine UR 10/04/2021 None Detected  NONE DETECTED (Cut Off Level 1000 ng/mL)  Final   POC Secobarbital (BAR) 10/04/2021 None Detected  NONE DETECTED (Cut Off Level 300 ng/mL) Final   POC Buprenorphine (BUP) 10/04/2021 None Detected  NONE DETECTED (Cut Off Level 10 ng/mL) Final   POC Oxazepam (BZO) 10/04/2021 None Detected  NONE DETECTED (Cut Off Level 300 ng/mL) Final   POC Cocaine UR  10/04/2021 None Detected  NONE DETECTED (Cut Off Level 300 ng/mL) Final   POC Methamphetamine UR 10/04/2021 None Detected  NONE DETECTED (Cut Off Level 1000 ng/mL) Final   POC Morphine 10/04/2021 None Detected  NONE DETECTED (Cut Off Level 300 ng/mL) Final   POC Methadone UR 10/04/2021 None Detected  NONE DETECTED (Cut Off Level 300 ng/mL) Final   POC Oxycodone UR 10/04/2021 Positive (A)  NONE DETECTED (Cut Off Level 100 ng/mL) Final   POC Marijuana UR 10/04/2021 None Detected  NONE DETECTED (Cut Off Level 50 ng/mL) Final   SARSCOV2ONAVIRUS 2 AG 10/04/2021 NEGATIVE  NEGATIVE Final   Comment: (NOTE) SARS-CoV-2 antigen NOT DETECTED.   Negative results are presumptive.  Negative results do not preclude SARS-CoV-2 infection and should not be used as the sole basis for treatment or other patient management decisions, including infection  control decisions, particularly in the presence of clinical signs and  symptoms consistent with COVID-19, or in those who have been in contact with the virus.  Negative results must be combined with clinical observations, patient history, and epidemiological information. The expected result is Negative.  Fact Sheet for Patients: HandmadeRecipes.com.cy  Fact Sheet for Healthcare Providers: FuneralLife.at  This test is not yet approved or cleared by the [place] FDA and  has been authorized for detection and/or diagnosis of SARS-CoV-2 by FDA under an Emergency Use Authorization (EUA).  This EUA will remain in effect (meaning this test can be used) for the duration of  the COV                          ID-19 declaration under Section 564(b)(1) of the Act, 21 U.S.C. section 360bbb-3(b)(1), unless the authorization is terminated or revoked sooner.     Valproic Acid Lvl 10/04/2021 51  50.0 - 100.0 ug/mL Final   Performed at [facility] [address], [place], [place] [address]   Valproic Acid Lvl  10/08/2021 57  50.0 - 100.0 ug/mL Final   Performed at [facility] [address], [place],  [address]   Glucose-Capillary 10/09/2021 123 (H)  70 - 99 mg/dL Final   Glucose reference range applies only to samples taken after fasting for at least 8 hours.  Admission on 09/09/2021, Discharged on 09/10/2021  Component Date Value Ref Range Status   Sodium 09/09/2021 134 (L)  135 - 145 mmol/L Final   Potassium 09/09/2021 4.3  3.5 - 5.1 mmol/L Final   Chloride 09/09/2021 99  98 - 111 mmol/L Final   CO2 09/09/2021 25  22 - 32 mmol/L Final   Glucose, Bld 09/09/2021 107 (H)  70 - 99 mg/dL Final   Glucose reference range applies only to samples taken after fasting for at least 8 hours.   BUN 09/09/2021 12  6 - 20 mg/dL Final   Creatinine, Ser 09/09/2021 0.74  0.44 - 1.00 mg/dL Final   Calcium 09/09/2021 9.0  8.9 - 10.3 mg/dL Final   Total Protein 09/09/2021 7.4  6.5 - 8.1 g/dL Final   Albumin 09/09/2021 3.1 (L)  3.5 - 5.0 g/dL Final   AST 09/09/2021  13 (L)  15 - 41 U/L Final   ALT 09/09/2021 13  0 - 44 U/L Final   Alkaline Phosphatase 09/09/2021 66  38 - 126 U/L Final   Total Bilirubin 09/09/2021 0.2 (L)  0.3 - 1.2 mg/dL Final   GFR, Estimated 09/09/2021 >60  >60 mL/min Final   Comment: (NOTE) Calculated using the CKD-EPI Creatinine Equation (2021)    Anion gap 09/09/2021 10  5 - 15 Final   Performed at [facility], [place] [address] [place], [place] [address]   WBC 09/09/2021 8.5  4.0 - 10.5 K/uL Final   RBC 09/09/2021 4.53  3.87 - 5.11 MIL/uL Final   Hemoglobin 09/09/2021 11.8 (L)  12.0 - 15.0 g/dL Final   HCT 09/09/2021 38.0  36.0 - 46.0 % Final   MCV 09/09/2021 83.9  80.0 - 100.0 fL Final   MCH 09/09/2021 26.0  26.0 - 34.0 pg Final   MCHC 09/09/2021 31.1  30.0 - 36.0 g/dL Final   RDW 09/09/2021 17.8 (H)  11.5 - 15.5 % Final   Platelets 09/09/2021 442 (H)  150 - 400 K/uL Final   nRBC 09/09/2021 0.0  0.0 - 0.2 % Final   Neutrophils Relative % 09/09/2021 70  % Final    Neutro Abs 09/09/2021 5.9  1.7 - 7.7 K/uL Final   Lymphocytes Relative 09/09/2021 20  % Final   Lymphs Abs 09/09/2021 1.7  0.7 - 4.0 K/uL Final   Monocytes Relative 09/09/2021 8  % Final   Monocytes Absolute 09/09/2021 0.7  0.1 - 1.0 K/uL Final   Eosinophils Relative 09/09/2021 1  % Final   Eosinophils Absolute 09/09/2021 0.1  0.0 - 0.5 K/uL Final   Basophils Relative 09/09/2021 1  % Final   Basophils Absolute 09/09/2021 0.1  0.0 - 0.1 K/uL Final   Immature Granulocytes 09/09/2021 0  % Final   Abs Immature Granulocytes 09/09/2021 0.03  0.00 - 0.07 K/uL Final   Performed at [facility], [place] [address], [place], [place] [address]   Ammonia 09/09/2021 37 (H)  9 - 35 umol/L Final   Comment: HEMOLYSIS AT THIS LEVEL MAY AFFECT RESULT Performed at [facility], [place] [address] [place], [place] [address]    Opiates 09/09/2021 NONE DETECTED  NONE DETECTED Final   Cocaine 09/09/2021 NONE DETECTED  NONE DETECTED Final   Benzodiazepines 09/09/2021 NONE DETECTED  NONE DETECTED Final   Amphetamines 09/09/2021 NONE DETECTED  NONE DETECTED Final   Tetrahydrocannabinol 09/09/2021 NONE DETECTED  NONE DETECTED Final   Barbiturates 09/09/2021 NONE DETECTED  NONE DETECTED Final   Comment: (NOTE) DRUG SCREEN FOR MEDICAL PURPOSES ONLY.  IF CONFIRMATION IS NEEDED FOR ANY PURPOSE, NOTIFY LAB WITHIN 5 DAYS.  LOWEST DETECTABLE LIMITS FOR URINE DRUG SCREEN Drug Class                     Cutoff (ng/mL) Amphetamine and metabolites    1000 Barbiturate and metabolites    200 Benzodiazepine                 914 Tricyclics and metabolites     300 Opiates and metabolites        300 Cocaine and metabolites        300 THC                            50 Performed at [facility], [place]  [address], [place], [place] [address]    Alcohol, Ethyl (B) 09/09/2021 <10  <10 mg/dL Final   Comment: (NOTE) Lowest detectable limit for serum alcohol is 10 mg/dL.  For medical purposes only. Performed at  [facility], [place] [address], [place], [place] [address]    Lipase 09/09/2021 33  11 - 51 U/L Final   Performed at [facility] [address] [place], [place] [address]   Color, Urine 09/09/2021 COLORLESS (A)  YELLOW Final   APPearance 09/09/2021 CLEAR  CLEAR Final   Specific Gravity, Urine 09/09/2021 1.002 (L)  1.005 - 1.030 Final   pH 09/09/2021 6.0  5.0 - 8.0 Final   Glucose, UA 09/09/2021 NEGATIVE  NEGATIVE mg/dL Final   Hgb urine dipstick 09/09/2021 NEGATIVE  NEGATIVE Final   Bilirubin Urine 09/09/2021 NEGATIVE  NEGATIVE Final   Ketones, ur 09/09/2021 NEGATIVE  NEGATIVE mg/dL Final   Protein, ur 09/09/2021 NEGATIVE  NEGATIVE mg/dL Final   Nitrite 09/09/2021 NEGATIVE  NEGATIVE Final   Leukocytes,Ua 09/09/2021 NEGATIVE  NEGATIVE Final   Performed at [facility] [address], [place], [place] [address]   Specimen Description 09/09/2021 URINE, CLEAN CATCH   Final   Special Requests 09/09/2021 NONE   Final   Culture 09/09/2021  (A)   Final                   Value:<10,000 COLONIES/mL INSIGNIFICANT GROWTH Performed at [facility], [place] [address], [place], [place] [address]    Report Status 09/09/2021 09/10/2021 FINAL   Final   D-Dimer, Quant 09/09/2021 0.92 (H)  0.00 - 0.50 ug/mL-FEU Final   Comment: (NOTE) At the manufacturer cut-off value of 0.5 g/mL FEU, this assay has a negative predictive value of 95-100%.This assay is intended for use in conjunction with a clinical pretest probability (PTP) assessment model to exclude pulmonary embolism (PE) and deep venous thrombosis (DVT) in outpatients suspected of PE or DVT. Results should be correlated with clinical presentation. Performed at [facility], [place] [address], [place], [place] [address]    Troponin I (High Sensitivity) 09/09/2021 5  <18 ng/L Final   Comment: (NOTE) Elevated high sensitivity troponin I (hsTnI) values and significant  changes across serial measurements may suggest ACS  but many other  chronic and acute conditions are known to elevate hsTnI results.  Refer to the "Links" section for chest pain algorithms and additional  guidance. Performed at [facility], [place] [address] [place], [place] [address]    BP 09/10/2021 135/83  mmHg Final   S' Lateral 09/10/2021 2.30  cm Final   Area-P 1/2 09/10/2021 5.58  cm2 Final    Blood Alcohol level:  Lab Results  Component Value Date   ETH <10 11/23/2021   ETH <10 17/61/6073    Metabolic Disorder Labs: Lab Results  Component Value Date   HGBA1C 7.0 (H) 10/04/2021   MPG 154.2 10/04/2021   Lab Results  Component Value Date   PROLACTIN 3.8 (L) 11/23/2021   Lab Results  Component Value Date   CHOL 171 11/23/2021   TRIG 125 11/23/2021   HDL 65 11/23/2021   CHOLHDL 2.6 11/23/2021   VLDL 25 11/23/2021   LDLCALC 81 11/23/2021   LDLCALC 95 10/04/2021    Therapeutic Lab Levels: No results found for: "LITHIUM" Lab Results  Component Value Date   VALPROATE 54 12/03/2021   VALPROATE 45 (L) 11/09/2021   No results found for: "CBMZ"  Physical Findings   PHQ2-9    Flowsheet Row ED from 11/23/2021 in [facility]  PHQ-2 Total Score 2  PHQ-9 Total Score 3      Flowsheet Row ED from 11/23/2021 in [facility] Most recent reading at 11/23/2021  6:44 PM ED from 11/22/2021 in [facility] Most recent reading at 11/22/2021  8:28 PM ED from 11/22/2021 in [facility] Most recent reading at 11/22/2021  1:24 AM  C-SSRS RISK CATEGORY No Risk No Risk No Risk        Musculoskeletal  Strength & Muscle Tone: within normal limits Gait & Station: normal Patient leans: N/A  Psychiatric Specialty Exam  Presentation  General Appearance:  Disheveled  Eye Contact: Fair  Speech: Clear and Coherent; Normal Rate  Speech Volume: Normal  Handedness: Left   Mood and Affect   Mood: Euthymic  Affect: Constricted   Thought Process  Thought Processes: Coherent; Goal Directed; Linear  Descriptions of Associations:Intact  Orientation:Full (Time, Place and Person)  Thought Content:WDL  Diagnosis of Schizophrenia or Schizoaffective disorder in past: Yes  Duration of Psychotic Symptoms: No data recorded  Hallucinations:Hallucinations: None  Ideas of Reference:None  Suicidal Thoughts:Suicidal Thoughts: No  Homicidal Thoughts:Homicidal Thoughts: No   Sensorium  Memory: Immediate Good  Judgment: Fair  Insight: Fair   Community education officer  Concentration: Good  Attention Span: Good  Recall: Good  Fund of Knowledge: Good  Language: Good   Psychomotor Activity  Psychomotor Activity: Psychomotor Activity: Normal   Assets  Assets: Communication Skills; Desire for Improvement   Sleep  Sleep: Sleep: Good   No data recorded  Physical Exam  Physical Exam Constitutional:      General: She is not in acute distress.    Appearance: She is not ill-appearing, toxic-appearing or diaphoretic.  Eyes:     General: No scleral icterus.       Right eye: No discharge.        Left eye: No discharge.  Cardiovascular:     Rate and Rhythm: Normal rate.  Pulmonary:     Effort: Pulmonary effort is normal. No respiratory distress.  Skin:    Coloration: Skin is not jaundiced or pale.  Neurological:     Mental Status: She is alert and oriented to person, place, and time.  Psychiatric:        Attention and Perception: Attention and perception normal.        Mood and Affect: Mood normal.        Speech: Speech normal.        Behavior: Behavior normal. Behavior is cooperative.        Thought Content: Thought content normal.    Review of Systems  Constitutional:  Negative for chills and fever.  Respiratory:  Negative for shortness of breath.   Cardiovascular:  Negative for chest pain and palpitations.  Gastrointestinal:  Negative for  abdominal pain.  Neurological:  Negative for headaches.  Psychiatric/Behavioral: Negative.     Blood pressure 136/82, pulse 96, temperature 98.2 F (36.8 C), temperature source Oral, resp. rate 16, SpO2 99 %. There is no height or weight on file to calculate BMI.  Treatment Plan Summary: Plan Pt remains psychiatrically cleared. Dispo is pending.  Tharon Aquas, NP 12/26/2021 11:43 AM

## 2021-12-26 NOTE — ED Notes (Signed)
Pt is sleeping. No distress noted. Will continue to monitor safety. 

## 2021-12-26 NOTE — ED Notes (Signed)
Pt asleep in bed. Respirations even and unlabored. Monitoring for safety. 

## 2021-12-26 NOTE — ED Notes (Signed)
Pt is A& Ox4. Pt is lying on the stretcher watching TV. Pt denies SI/HI/AVH. Will continue to monitor.

## 2021-12-27 DIAGNOSIS — F200 Paranoid schizophrenia: Secondary | ICD-10-CM | POA: Diagnosis not present

## 2021-12-27 LAB — GLUCOSE, CAPILLARY
Glucose-Capillary: 123 mg/dL — ABNORMAL HIGH (ref 70–99)
Glucose-Capillary: 157 mg/dL — ABNORMAL HIGH (ref 70–99)
Glucose-Capillary: 212 mg/dL — ABNORMAL HIGH (ref 70–99)
Glucose-Capillary: 106 mg/dL — ABNORMAL HIGH (ref 70–99)

## 2021-12-27 NOTE — ED Notes (Signed)
Pt is sleeping. No distress noted. Will continue to monitor safety. 

## 2021-12-27 NOTE — ED Notes (Signed)
Provider made aware of patient's complaints of vague pain in her left foot

## 2021-12-27 NOTE — ED Provider Notes (Signed)
Behavioral Health Progress Note  Date and Time: 12/27/2021 8:38 AM Name: Teresa Murillo MRN:  [id number]  Subjective:    Teresa Murillo is a 61 y.o. female with a past psychiatric history of schizophrenia, aggressive behavior, and possible intellectual disability presenting to [facility] on 11/23/21 voluntarily as a walk in via [facility] with complaints that she was locked out of the group home. Patient has been dismissed from group home due to multiple elopements. Pt had already been discharged from this facility but had been living there temporarily while DSS was looking for new placement. Pt is boarding at [facility].   AID to Capacity Evaluation (ACE) completed by Dr. Louis Meckel, Dr. Dwyane Dee on 12/11/2021.  Please see media tab for full details.    Teresa Harman, PhD completed: Wechsler Adult Intelligence Scale-4, Teresa Murillo achieved a full-scale IQ score of 73 and a percentile rank of 4 placing her in the borderline range of intellectual functioning (12/14/2021) Please see consult note from Teresa Harman, PhD on 12/14/2021   On reassessment, pt is a&ox3, in no acute distress, non toxic appearing. Pt denies SI/VI/HI, AVH, paranoia. She verbalizes understanding that dispo is pending. She denies any physical or psychiatric complaints. Pt remains psychiatrically cleared.   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 On the Wechsler Adult Intelligence Scale-4, Teresa Murillo achieved a full-scale IQ score of 73 and a percentile rank of 4 placing her in the borderline range of intellectual functioning.     Chronic obstructive pulmonary disease (COPD) (HCC)    Glaucoma    Hyperlipidemia    Hypertension    Iron deficiency    Schizoaffective disorder (HCC)    Type 2 diabetes mellitus (HCC)     Past Surgical History:  Procedure Laterality Date   TUBAL LIGATION     Family History:  Family History  Problem Relation Age of Onset   Breast cancer Maternal Grandmother    Family Psychiatric  History: None reported Social History:  Social History   Substance and Sexual Activity  Alcohol Use Yes     Social History   Substance and Sexual Activity  Drug Use Not Currently    Social History   Socioeconomic History   Marital status: Divorced    Spouse name: Not on file   Number of children: Not on file   Years of education: Not on file   Highest education level: Not on file  Occupational History   Not on file  Tobacco Use   Smoking status: Every Day    Packs/day: 1.00    Types: Cigars, Cigarettes   Smokeless tobacco: Current  Vaping Use   Vaping Use: Never used  Substance and Sexual Activity   Alcohol use: Yes   Drug use: Not Currently   Sexual activity: Not Currently    Birth control/protection: Surgical  Other Topics Concern   Not on file  Social History Narrative   Not on file   Social Determinants of Health   Financial Resource Strain: Not on file  Food Insecurity: Not on file  Transportation Needs: Not on file  Physical Activity: Not on file  Stress: Not on file  Social Connections: Not on  file   SDOH:  SDOH Screenings   Depression (PHQ2-9): Low Risk  (11/23/2021)  Tobacco Use: High Risk (12/14/2021)   Additional Social History:    Pain Medications: See MAR Prescriptions: See MAR Over the Counter: See MAR History of alcohol / drug use?: No history of alcohol / drug abuse Longest period of sobriety (when/how long): N/A                    Sleep: Good  Appetite:  Good  Current Medications:  Current Facility-Administered Medications   Medication Dose Route Frequency Provider Last Rate Last Admin   acetaminophen (TYLENOL) tablet 650 mg  650 mg Oral Q6H PRN Rankin, Shuvon B, NP   650 mg at 12/25/21 2200   albuterol (VENTOLIN HFA) 108 (90 Base) MCG/ACT inhaler 2 puff  2 puff Inhalation Q6H PRN Rankin, Shuvon B, NP   2 puff at 12/09/21 2102   alum & mag hydroxide-simeth (MAALOX/MYLANTA) 200-200-20 MG/5ML suspension 30 mL  30 mL Oral Q4H PRN Rankin, Shuvon B, NP   30 mL at 12/19/21 2109   apixaban (ELIQUIS) tablet 5 mg  5 mg Oral BID Rankin, Shuvon B, NP   5 mg at 12/26/21 2109   cloZAPine (CLOZARIL) tablet 50 mg  50 mg Oral Daily [name], NP   50 mg at 12/26/21 0957   cloZAPine (CLOZARIL) tablet 75 mg  75 mg Oral QHS [name], NP   75 mg at 12/26/21 2108   diltiazem (CARDIZEM CD) 24 hr capsule 240 mg  240 mg Oral Daily Rankin, Shuvon B, NP   240 mg at 12/26/21 0957   divalproex (DEPAKOTE ER) 24 hr tablet 500 mg  500 mg Oral BID Rankin, Shuvon B, NP   500 mg at 12/26/21 2109   fluticasone furoate-vilanterol (BREO ELLIPTA) 200-25 MCG/ACT 1 puff  1 puff Inhalation Daily Rankin, Shuvon B, NP   1 puff at 12/27/21 0831   haloperidol (HALDOL) tablet 10 mg  10 mg Oral Q8H PRN Rankin, Shuvon B, NP       insulin aspart (novoLOG) injection 0-9 Units  0-9 Units Subcutaneous TID WC Tharon Aquas, NP   1 Units at 12/27/21 0835   insulin glargine-yfgn (SEMGLEE) injection 10 Units  10 Units Subcutaneous Daily [name], DO   10 Units at 12/26/21 0957   latanoprost (XALATAN) 0.005 % ophthalmic solution 1 drop  1 drop Both Eyes QHS Rankin, Shuvon B, NP   1 drop at 12/26/21 2116   magnesium hydroxide (MILK OF MAGNESIA) suspension 30 mL  30 mL Oral Daily PRN Rankin, Shuvon B, NP       menthol-cetylpyridinium (CEPACOL) lozenge 3 mg  1 lozenge Oral Once [name], NP       menthol-cetylpyridinium (CEPACOL) lozenge 3 mg  1 lozenge Oral PRN [name], NP   3 mg at 12/25/21 0024   metFORMIN (GLUCOPHAGE-XR) 24 hr tablet 1,000 mg   1,000 mg Oral BID WC [name], DO   1,000 mg at 12/27/21 5009   nicotine (NICODERM CQ - dosed in mg/24 hr) patch 7 mg  7 mg Transdermal Daily Rankin, Shuvon B, NP   7 mg at 12/26/21 3818   Vitamin D (Ergocalciferol) (DRISDOL) 1.25 MG (50000 UNIT) capsule 50,000 Units  50,000 Units Oral Q7 days Rankin, Shuvon B, NP   50,000 Units at 12/24/21 2993   Current Outpatient Medications  Medication Sig Dispense Refill   Accu-Chek Softclix Lancets lancets Use as directed up to four times  daily 100 each 0   apixaban (ELIQUIS) 5 MG TABS tablet Take 1 tablet (5 mg total) by mouth 2 (two) times daily. 60 tablet 0   ARIPiprazole (ABILIFY) 10 MG tablet Take 1 tablet (10 mg total) by mouth daily. 30 tablet 0   Blood Glucose Monitoring Suppl (ACCU-CHEK GUIDE) w/Device KIT Use as directed up to four times daily 1 kit 0   budesonide-formoterol (SYMBICORT) 160-4.5 MCG/ACT inhaler Inhale 2 puffs into the lungs in the morning and at bedtime.     Cholecalciferol (VITAMIN D3) 1.25 MG (50000 UT) CAPS Take 50,000 Units by mouth every Thursday.     cloZAPine (CLOZARIL) 25 MG tablet Take 3 tablets (75 mg total) by mouth at bedtime. 90 tablet 0   clozapine (CLOZARIL) 50 MG tablet Take 1 tablet (50 mg total) by mouth daily. 30 tablet 0   diltiazem (CARDIZEM CD) 240 MG 24 hr capsule Take 1 capsule (240 mg total) by mouth daily. (Patient not taking: Reported on 11/24/2021) 30 capsule 0   divalproex (DEPAKOTE ER) 500 MG 24 hr tablet Take 1 tablet (500 mg total) by mouth 2 (two) times daily. 60 tablet 0   glucose blood test strip Use as directed up to four times daily 50 each 0   haloperidol (HALDOL) 10 MG tablet Take 1 tablet (10 mg total) by mouth 3 (three) times daily as needed for agitation (and psychotic symptoms).     INGREZZA 40 MG capsule Take 1 capsule (40 mg total) by mouth in the morning. 30 capsule 0   insulin aspart (NOVOLOG) 100 UNIT/ML FlexPen Before each meal 3 times a day, 140-199 - 2 units, 200-250 - 4 units,  251-299 - 6 units,  300-349 - 8 units,  350 or above 10 units. Insulin PEN if approved, provide syringes and needles if needed.Please switch to any approved short acting Insulin if needed. 15 mL 0   insulin glargine (LANTUS) 100 UNIT/ML Solostar Pen Inject 12 Units into the skin daily. 15 mL 0   Insulin Pen Needle 32G X 4 MM MISC Use 4 times a day with insulin, 1 month supply. 100 each 0   latanoprost (XALATAN) 0.005 % ophthalmic solution Place 1 drop into both eyes at bedtime.     metFORMIN (GLUCOPHAGE) 500 MG tablet Take 500 mg by mouth 2 (two) times daily with a meal.     nicotine (NICODERM CQ - DOSED IN MG/24 HR) 7 mg/24hr patch Place 1 patch (7 mg total) onto the skin daily. 28 patch 0   PROAIR HFA 108 (90 Base) MCG/ACT inhaler Inhale 2 puffs into the lungs every 6 (six) hours as needed for wheezing or shortness of breath.      Labs  Lab Results:  No results displayed because visit has over 200 results.    Admission on 11/22/2021, Discharged on 11/22/2021  Component Date Value Ref Range Status   Glucose-Capillary 11/22/2021 169 (H)  70 - 99 mg/dL Final   Glucose reference range applies only to samples taken after fasting for at least 8 hours.  No results displayed because visit has over 200 results.    Admission on 10/04/2021, Discharged on 10/22/2021  Component Date Value Ref Range Status   SARS Coronavirus 2 by RT PCR 10/04/2021 NEGATIVE  NEGATIVE Final   Comment: (NOTE) SARS-CoV-2 target nucleic acids are NOT DETECTED.  The SARS-CoV-2 RNA is generally detectable in upper respiratory specimens during the acute phase of infection. The lowest concentration of SARS-CoV-2 viral copies this assay can  detect is 138 copies/mL. A negative result does not preclude SARS-Cov-2 infection and should not be used as the sole basis for treatment or other patient management decisions. A negative result may occur with  improper specimen collection/handling, submission of specimen other than  nasopharyngeal swab, presence of viral mutation(s) within the areas targeted by this assay, and inadequate number of viral copies(<138 copies/mL). A negative result must be combined with clinical observations, patient history, and epidemiological information. The expected result is Negative.  Fact Sheet for Patients:  EntrepreneurPulse.com.au  Fact Sheet for Healthcare Providers:  IncredibleEmployment.be  This test is no                          t yet approved or cleared by the [place] FDA and  has been authorized for detection and/or diagnosis of SARS-CoV-2 by FDA under an Emergency Use Authorization (EUA). This EUA will remain  in effect (meaning this test can be used) for the duration of the COVID-19 declaration under Section 564(b)(1) of the Act, 21 U.S.C.section 360bbb-3(b)(1), unless the authorization is terminated  or revoked sooner.       Influenza A by PCR 10/04/2021 NEGATIVE  NEGATIVE Final   Influenza B by PCR 10/04/2021 NEGATIVE  NEGATIVE Final   Comment: (NOTE) The Xpert Xpress SARS-CoV-2/FLU/RSV plus assay is intended as an aid in the diagnosis of influenza from Nasopharyngeal swab specimens and should not be used as a sole basis for treatment. Nasal washings and aspirates are unacceptable for Xpert Xpress SARS-CoV-2/FLU/RSV testing.  Fact Sheet for Patients: EntrepreneurPulse.com.au  Fact Sheet for Healthcare Providers: IncredibleEmployment.be  This test is not yet approved or cleared by the [place] FDA and has been authorized for detection and/or diagnosis of SARS-CoV-2 by FDA under an Emergency Use Authorization (EUA). This EUA will remain in effect (meaning this test can be used) for the duration of the COVID-19 declaration under Section 564(b)(1) of the Act, 21 U.S.C. section 360bbb-3(b)(1), unless the authorization is terminated or revoked.  Performed at [facility], [place] [address] [place], [place] [address]    WBC 10/04/2021 8.4  4.0 - 10.5 K/uL Final   RBC 10/04/2021 4.42  3.87 - 5.11 MIL/uL Final   Hemoglobin 10/04/2021 11.6 (L)  12.0 - 15.0 g/dL Final   HCT 10/04/2021 35.7 (L)  36.0 - 46.0 % Final   MCV 10/04/2021 80.8  80.0 - 100.0 fL Final   MCH 10/04/2021 26.2  26.0 - 34.0 pg Final   MCHC 10/04/2021 32.5  30.0 - 36.0 g/dL Final   RDW 10/04/2021 16.0 (H)  11.5 - 15.5 % Final   Platelets 10/04/2021 372  150 - 400 K/uL Final   nRBC 10/04/2021 0.0  0.0 - 0.2 % Final   Neutrophils Relative % 10/04/2021 68  % Final   Neutro Abs 10/04/2021 5.7  1.7 - 7.7 K/uL Final   Lymphocytes Relative 10/04/2021 22  % Final   Lymphs Abs 10/04/2021 1.8  0.7 - 4.0 K/uL Final   Monocytes Relative 10/04/2021 8  % Final   Monocytes Absolute 10/04/2021 0.7  0.1 - 1.0 K/uL Final   Eosinophils Relative 10/04/2021 1  % Final   Eosinophils Absolute 10/04/2021 0.1  0.0 - 0.5 K/uL Final   Basophils Relative 10/04/2021 1  % Final   Basophils Absolute 10/04/2021 0.1  0.0 - 0.1 K/uL Final   Immature Granulocytes 10/04/2021 0  % Final   Abs Immature Granulocytes 10/04/2021 0.03  0.00 - 0.07 K/uL Final   Performed at [facility], [place] [address] [place], [place] [address]   Sodium 10/04/2021 136  135 - 145 mmol/L Final   Potassium 10/04/2021 4.2  3.5 - 5.1 mmol/L Final   Chloride 10/04/2021 104  98 - 111 mmol/L Final   CO2 10/04/2021 25  22 - 32 mmol/L Final   Glucose, Bld 10/04/2021 100 (H)  70 - 99 mg/dL Final   Glucose reference range applies only to samples taken after fasting for at least 8 hours.   BUN 10/04/2021 9  6 - 20 mg/dL Final   Creatinine, Ser 10/04/2021 0.52  0.44 - 1.00 mg/dL Final   Calcium 10/04/2021 9.0  8.9 - 10.3 mg/dL Final   Total Protein 10/04/2021 7.0  6.5 - 8.1 g/dL Final   Albumin 10/04/2021 3.2 (L)  3.5 - 5.0 g/dL Final   AST 10/04/2021 13 (L)  15 - 41 U/L Final   ALT 10/04/2021 10  0 - 44 U/L Final   Alkaline Phosphatase  10/04/2021 61  38 - 126 U/L Final   Total Bilirubin 10/04/2021 0.3  0.3 - 1.2 mg/dL Final   GFR, Estimated 10/04/2021 >60  >60 mL/min Final   Comment: (NOTE) Calculated using the CKD-EPI Creatinine Equation (2021)    Anion gap 10/04/2021 7  5 - 15 Final   Performed at [facility] [address], [place], [place] [address]   Hgb A1c MFr Bld 10/04/2021 7.0 (H)  4.8 - 5.6 % Final   Comment: (NOTE) Pre diabetes:          5.7%-6.4%  Diabetes:              >6.4%  Glycemic control for   <7.0% adults with diabetes    Mean Plasma Glucose 10/04/2021 154.2  mg/dL Final   Performed at [facility], [place] [address], [place], [place] [address]   Cholesterol 10/04/2021 178  0 - 200 mg/dL Final   Triglycerides 10/04/2021 155 (H)  <150 mg/dL Final   HDL 10/04/2021 52  >40 mg/dL Final   Total CHOL/HDL Ratio 10/04/2021 3.4  RATIO Final   VLDL 10/04/2021 31  0 - 40 mg/dL Final   LDL Cholesterol 10/04/2021 95  0 - 99 mg/dL Final   Comment:        Total Cholesterol/HDL:CHD Risk Coronary Heart Disease Risk Table                     Men   Women  1/2 Average Risk   3.4   3.3  Average Risk       5.0   4.4  2 X Average Risk   9.6   7.1  3 X Average Risk  23.4   11.0        Use the calculated Patient Ratio above and the CHD Risk Table to determine the patient's CHD Risk.        ATP III CLASSIFICATION (LDL):  <100     mg/dL   Optimal  100-129  mg/dL   Near or Above                    Optimal  130-159  mg/dL   Borderline  160-189  mg/dL   High  >190     mg/dL   Very High Performed at [facility] [address], [place], [place] [address]    POC Amphetamine UR 10/04/2021 None  Detected  NONE DETECTED (Cut Off Level 1000 ng/mL) Final   POC Secobarbital (BAR) 10/04/2021 None Detected  NONE DETECTED (Cut Off Level 300 ng/mL) Final   POC Buprenorphine (BUP) 10/04/2021 None Detected  NONE DETECTED (Cut Off Level 10 ng/mL) Final   POC Oxazepam (BZO) 10/04/2021 None Detected  NONE  DETECTED (Cut Off Level 300 ng/mL) Final   POC Cocaine UR 10/04/2021 None Detected  NONE DETECTED (Cut Off Level 300 ng/mL) Final   POC Methamphetamine UR 10/04/2021 None Detected  NONE DETECTED (Cut Off Level 1000 ng/mL) Final   POC Morphine 10/04/2021 None Detected  NONE DETECTED (Cut Off Level 300 ng/mL) Final   POC Methadone UR 10/04/2021 None Detected  NONE DETECTED (Cut Off Level 300 ng/mL) Final   POC Oxycodone UR 10/04/2021 Positive (A)  NONE DETECTED (Cut Off Level 100 ng/mL) Final   POC Marijuana UR 10/04/2021 None Detected  NONE DETECTED (Cut Off Level 50 ng/mL) Final   SARSCOV2ONAVIRUS 2 AG 10/04/2021 NEGATIVE  NEGATIVE Final   Comment: (NOTE) SARS-CoV-2 antigen NOT DETECTED.   Negative results are presumptive.  Negative results do not preclude SARS-CoV-2 infection and should not be used as the sole basis for treatment or other patient management decisions, including infection  control decisions, particularly in the presence of clinical signs and  symptoms consistent with COVID-19, or in those who have been in contact with the virus.  Negative results must be combined with clinical observations, patient history, and epidemiological information. The expected result is Negative.  Fact Sheet for Patients: HandmadeRecipes.com.cy  Fact Sheet for Healthcare Providers: FuneralLife.at  This test is not yet approved or cleared by the [place] FDA and  has been authorized for detection and/or diagnosis of SARS-CoV-2 by FDA under an Emergency Use Authorization (EUA).  This EUA will remain in effect (meaning this test can be used) for the duration of  the COV                          ID-19 declaration under Section 564(b)(1) of the Act, 21 U.S.C. section 360bbb-3(b)(1), unless the authorization is terminated or revoked sooner.     Valproic Acid Lvl 10/04/2021 51  50.0 - 100.0 ug/mL Final   Performed at [facility] [address], [place], [place] [address]   Valproic Acid Lvl 10/08/2021 57  50.0 - 100.0 ug/mL Final   Performed at [facility] [address] [place], [place] [address]   Glucose-Capillary 10/09/2021 123 (H)  70 - 99 mg/dL Final   Glucose reference range applies only to samples taken after fasting for at least 8 hours.  Admission on 09/09/2021, Discharged on 09/10/2021  Component Date Value Ref Range Status   Sodium 09/09/2021 134 (L)  135 - 145 mmol/L Final   Potassium 09/09/2021 4.3  3.5 - 5.1 mmol/L Final   Chloride 09/09/2021 99  98 - 111 mmol/L Final   CO2 09/09/2021 25  22 - 32 mmol/L Final   Glucose, Bld 09/09/2021 107 (H)  70 - 99 mg/dL Final   Glucose reference range applies only to samples taken after fasting for at least 8 hours.   BUN 09/09/2021 12  6 - 20 mg/dL Final   Creatinine, Ser 09/09/2021 0.74  0.44 - 1.00 mg/dL Final   Calcium 09/09/2021 9.0  8.9 - 10.3 mg/dL Final   Total Protein 09/09/2021 7.4  6.5 - 8.1 g/dL Final   Albumin 09/09/2021 3.1 (L)  3.5 - 5.0 g/dL Final   AST 09/09/2021 13 (L)  15 - 41 U/L Final   ALT 09/09/2021 13  0 - 44 U/L Final   Alkaline Phosphatase 09/09/2021 66  38 - 126 U/L Final   Total Bilirubin 09/09/2021 0.2 (L)  0.3 - 1.2 mg/dL Final   GFR, Estimated 09/09/2021 >60  >60 mL/min Final   Comment: (NOTE) Calculated using the CKD-EPI Creatinine Equation (2021)    Anion gap 09/09/2021 10  5 - 15 Final   Performed at [facility], [place] [address], [place], [place] [address]   WBC 09/09/2021 8.5  4.0 - 10.5 K/uL Final   RBC 09/09/2021 4.53  3.87 - 5.11 MIL/uL Final   Hemoglobin 09/09/2021 11.8 (L)  12.0 - 15.0 g/dL Final   HCT 09/09/2021 38.0  36.0 - 46.0 % Final   MCV 09/09/2021 83.9  80.0 - 100.0 fL Final   MCH 09/09/2021 26.0  26.0 - 34.0 pg Final   MCHC 09/09/2021 31.1  30.0 - 36.0 g/dL Final   RDW 09/09/2021 17.8 (H)  11.5 - 15.5 % Final   Platelets 09/09/2021 442 (H)  150 - 400 K/uL Final   nRBC 09/09/2021 0.0  0.0 - 0.2  % Final   Neutrophils Relative % 09/09/2021 70  % Final   Neutro Abs 09/09/2021 5.9  1.7 - 7.7 K/uL Final   Lymphocytes Relative 09/09/2021 20  % Final   Lymphs Abs 09/09/2021 1.7  0.7 - 4.0 K/uL Final   Monocytes Relative 09/09/2021 8  % Final   Monocytes Absolute 09/09/2021 0.7  0.1 - 1.0 K/uL Final   Eosinophils Relative 09/09/2021 1  % Final   Eosinophils Absolute 09/09/2021 0.1  0.0 - 0.5 K/uL Final   Basophils Relative 09/09/2021 1  % Final   Basophils Absolute 09/09/2021 0.1  0.0 - 0.1 K/uL Final   Immature Granulocytes 09/09/2021 0  % Final   Abs Immature Granulocytes 09/09/2021 0.03  0.00 - 0.07 K/uL Final   Performed at [facility], [place] [address], [place], [place] [address]   Ammonia 09/09/2021 37 (H)  9 - 35 umol/L Final   Comment: HEMOLYSIS AT THIS LEVEL MAY AFFECT RESULT Performed at [facility], [place] [address] [place], [place] [address]    Opiates 09/09/2021 NONE DETECTED  NONE DETECTED Final   Cocaine 09/09/2021 NONE DETECTED  NONE DETECTED Final   Benzodiazepines 09/09/2021 NONE DETECTED  NONE DETECTED Final   Amphetamines 09/09/2021 NONE DETECTED  NONE DETECTED Final   Tetrahydrocannabinol 09/09/2021 NONE DETECTED  NONE DETECTED Final   Barbiturates 09/09/2021 NONE DETECTED  NONE DETECTED Final   Comment: (NOTE) DRUG SCREEN FOR MEDICAL PURPOSES ONLY.  IF CONFIRMATION IS NEEDED FOR ANY PURPOSE, NOTIFY LAB WITHIN 5 DAYS.  LOWEST DETECTABLE LIMITS FOR URINE DRUG SCREEN Drug Class                     Cutoff (ng/mL) Amphetamine and metabolites    1000 Barbiturate and metabolites    200 Benzodiazepine                 767 Tricyclics and metabolites     300 Opiates and metabolites        300 Cocaine and metabolites        300 THC                            50  Performed at [facility], [place] [address] [place], [place] [address]    Alcohol, Ethyl (B) 09/09/2021 <10  <10 mg/dL Final   Comment: (NOTE) Lowest detectable limit for serum alcohol  is 10 mg/dL.  For medical purposes only. Performed at [facility], [place] [address], [place], [place] [address]    Lipase 09/09/2021 33  11 - 51 U/L Final   Performed at [facility] [address] [place], [place] [address]   Color, Urine 09/09/2021 COLORLESS (A)  YELLOW Final   APPearance 09/09/2021 CLEAR  CLEAR Final   Specific Gravity, Urine 09/09/2021 1.002 (L)  1.005 - 1.030 Final   pH 09/09/2021 6.0  5.0 - 8.0 Final   Glucose, UA 09/09/2021 NEGATIVE  NEGATIVE mg/dL Final   Hgb urine dipstick 09/09/2021 NEGATIVE  NEGATIVE Final   Bilirubin Urine 09/09/2021 NEGATIVE  NEGATIVE Final   Ketones, ur 09/09/2021 NEGATIVE  NEGATIVE mg/dL Final   Protein, ur 09/09/2021 NEGATIVE  NEGATIVE mg/dL Final   Nitrite 09/09/2021 NEGATIVE  NEGATIVE Final   Leukocytes,Ua 09/09/2021 NEGATIVE  NEGATIVE Final   Performed at [facility] [address] [place], [place] [address]   Specimen Description 09/09/2021 URINE, CLEAN CATCH   Final   Special Requests 09/09/2021 NONE   Final   Culture 09/09/2021  (A)   Final                   Value:<10,000 COLONIES/mL INSIGNIFICANT GROWTH Performed at [facility], [place] [address] [place], [place] [address]    Report Status 09/09/2021 09/10/2021 FINAL   Final   D-Dimer, Quant 09/09/2021 0.92 (H)  0.00 - 0.50 ug/mL-FEU Final   Comment: (NOTE) At the manufacturer cut-off value of 0.5 g/mL FEU, this assay has a negative predictive value of 95-100%.This assay is intended for use in conjunction with a clinical pretest probability (PTP) assessment model to exclude pulmonary embolism (PE) and deep venous thrombosis (DVT) in outpatients suspected of PE or DVT. Results should be correlated with clinical presentation. Performed at [facility], [place] [address], [place], [place] [address]    Troponin I (High Sensitivity) 09/09/2021 5  <18 ng/L Final   Comment: (NOTE) Elevated high sensitivity troponin I (hsTnI) values and  significant  changes across serial measurements may suggest ACS but many other  chronic and acute conditions are known to elevate hsTnI results.  Refer to the "Links" section for chest pain algorithms and additional  guidance. Performed at [facility], [place] [address], [place], [place] [address]    BP 09/10/2021 135/83  mmHg Final   S' Lateral 09/10/2021 2.30  cm Final   Area-P 1/2 09/10/2021 5.58  cm2 Final    Blood Alcohol level:  Lab Results  Component Value Date   ETH <10 11/23/2021   ETH <10 79/15/0569    Metabolic Disorder Labs: Lab Results  Component Value Date   HGBA1C 7.0 (H) 10/04/2021   MPG 154.2 10/04/2021   Lab Results  Component Value Date   PROLACTIN 3.8 (L) 11/23/2021   Lab Results  Component Value Date   CHOL 171 11/23/2021   TRIG 125 11/23/2021   HDL 65 11/23/2021   CHOLHDL 2.6 11/23/2021   VLDL 25 11/23/2021   LDLCALC 81 11/23/2021   LDLCALC 95 10/04/2021    Therapeutic Lab Levels: No results found for: "LITHIUM" Lab Results  Component Value Date   VALPROATE 55 12/03/2021   VALPROATE 46 (L)  11/09/2021   No results found for: "CBMZ"  Physical Findings   PHQ2-9    [facility] from 11/23/2021 in [facility]  PHQ-2 Total Score 2  PHQ-9 Total Score 3      Flowsheet Row ED from 11/23/2021 in [facility] Most recent reading at 11/23/2021  6:44 PM ED from 11/22/2021 in [place] DEPT Most recent reading at 11/22/2021  8:28 PM ED from 11/22/2021 in [place] [facility] Most recent reading at 11/22/2021  1:24 AM  C-SSRS RISK CATEGORY No Risk No Risk No Risk        Musculoskeletal  Strength & Muscle Tone: within normal limits Gait & Station: normal Patient leans: N/A  Psychiatric Specialty Exam  Presentation  General Appearance:  Disheveled  Eye Contact: Fair  Speech: Clear and Coherent; Normal Rate  Speech  Volume: Normal  Handedness: Left   Mood and Affect  Mood: Euthymic  Affect: Constricted   Thought Process  Thought Processes: Coherent; Goal Directed; Linear  Descriptions of Associations:Intact  Orientation:Full (Time, Place and Person)  Thought Content:WDL  Diagnosis of Schizophrenia or Schizoaffective disorder in past: Yes  Duration of Psychotic Symptoms: No data recorded  Hallucinations:Hallucinations: None  Ideas of Reference:None  Suicidal Thoughts:Suicidal Thoughts: No  Homicidal Thoughts:Homicidal Thoughts: No   Sensorium  Memory: Immediate Good  Judgment: Fair  Insight: Fair   Community education officer  Concentration: Good  Attention Span: Good  Recall: Good  Fund of Knowledge: Good  Language: Good   Psychomotor Activity  Psychomotor Activity: Psychomotor Activity: Normal   Assets  Assets: Communication Skills; Desire for Improvement; Financial Resources/Insurance; Resilience   Sleep  Sleep: Sleep: Good   No data recorded  Physical Exam  Physical Exam Constitutional:      General: She is not in acute distress.    Appearance: Normal appearance. She is not ill-appearing or toxic-appearing.  Eyes:     General: No scleral icterus.       Right eye: No discharge.        Left eye: No discharge.  Cardiovascular:     Rate and Rhythm: Normal rate.  Pulmonary:     Effort: Pulmonary effort is normal. No respiratory distress.  Skin:    General: Skin is warm and dry.     Coloration: Skin is not jaundiced or pale.  Neurological:     Mental Status: She is alert and oriented to person, place, and time.  Psychiatric:        Attention and Perception: Attention and perception normal.        Mood and Affect: Mood normal.        Speech: Speech normal.        Behavior: Behavior normal. Behavior is cooperative.        Thought Content: Thought content normal.    Review of Systems  Constitutional:  Negative for chills and fever.   Respiratory:  Negative for shortness of breath.   Cardiovascular:  Negative for chest pain and palpitations.  Gastrointestinal:  Negative for abdominal pain.  Neurological:  Negative for dizziness and headaches.  Psychiatric/Behavioral: Negative.     Blood pressure 113/60, pulse 66, temperature (!) 97.5 F (36.4 C), temperature source Oral, resp. rate 16, SpO2 99 %. There is no height or weight on file to calculate BMI.  Treatment Plan Summary: Plan Pt remains psychiatrically cleared. Dispo is pending.  Tharon Aquas, NP 12/27/2021 8:38 AM

## 2021-12-27 NOTE — ED Notes (Signed)
Pt A&O x 4, no distress noted, calm & cooperative, watching TV at present.  Monitoring for safety. 

## 2021-12-27 NOTE — ED Notes (Signed)
Patient resting quietly in bed watching TV.  Respirations equal and unlabored, skin warm and dry, NAD. Routine safety checks conducted according to facility protocol. Will continue to monitor for safety.   

## 2021-12-27 NOTE — ED Notes (Signed)
RX notified of needing replacement of eye drops

## 2021-12-27 NOTE — ED Notes (Signed)
Pt was given a salad and juice for dinner. 

## 2021-12-27 NOTE — ED Notes (Signed)
Patient denies SI/HI and AVH. Patient ate breakfast and took her medications. Patient has been sleeping this morning.

## 2021-12-27 NOTE — ED Notes (Signed)
Pt was given sub and juice for lunch.

## 2021-12-27 NOTE — ED Notes (Signed)
Patient resting quietly in bed with eyes closed. Respirations equal and unlabored, skin warm and dry, NAD. Routine safety checks conducted according to facility protocol. Will continue to monitor for safety.  

## 2021-12-27 NOTE — ED Notes (Signed)
Pt awake  & resting at present, no distress noted. Calm & cooperative.  Monitoring for safety. 

## 2021-12-28 LAB — COMPREHENSIVE METABOLIC PANEL
ALT: 17 U/L (ref 0–44)
AST: 19 U/L (ref 15–41)
Albumin: 3.3 g/dL — ABNORMAL LOW (ref 3.5–5.0)
Alkaline Phosphatase: 54 U/L (ref 38–126)
Anion gap: 11 (ref 5–15)
BUN: 11 mg/dL (ref 6–20)
CO2: 26 mmol/L (ref 22–32)
Calcium: 9.1 mg/dL (ref 8.9–10.3)
Chloride: 104 mmol/L (ref 98–111)
Creatinine, Ser: 0.63 mg/dL (ref 0.44–1.00)
GFR, Estimated: >60 mL/min (ref >60)
Glucose, Bld: 158 mg/dL — ABNORMAL HIGH (ref 70–99)
Potassium: 4.2 mmol/L (ref 3.5–5.1)
Sodium: 141 mmol/L (ref 135–145)
Total Bilirubin: 0.1 mg/dL — ABNORMAL LOW (ref 0.3–1.2)
Total Protein: 6.5 g/dL (ref 6.5–8.1)

## 2021-12-28 LAB — URINALYSIS, ROUTINE W REFLEX MICROSCOPIC
Bilirubin Urine: NEGATIVE
Glucose, UA: NEGATIVE mg/dL
Hgb urine dipstick: NEGATIVE
Ketones, ur: 20 mg/dL — AB
Leukocytes,Ua: NEGATIVE
Nitrite: NEGATIVE
Protein, ur: NEGATIVE mg/dL
Specific Gravity, Urine: 1.025 (ref 1.005–1.030)
pH: 5 (ref 5.0–8.0)

## 2021-12-28 LAB — GLUCOSE, CAPILLARY
Glucose-Capillary: 169 mg/dL — ABNORMAL HIGH (ref 70–99)
Glucose-Capillary: 101 mg/dL — ABNORMAL HIGH (ref 70–99)
Glucose-Capillary: 204 mg/dL — ABNORMAL HIGH (ref 70–99)
Glucose-Capillary: 120 mg/dL — ABNORMAL HIGH (ref 70–99)
Glucose-Capillary: 158 mg/dL — ABNORMAL HIGH (ref 70–99)

## 2021-12-28 LAB — HEMOGLOBIN A1C
Hgb A1c MFr Bld: 6.7 % — ABNORMAL HIGH (ref 4.8–5.6)
Mean Plasma Glucose: 145.59 mg/dL

## 2021-12-28 LAB — VALPROIC ACID LEVEL: Valproic Acid Lvl: 33 ug/mL — ABNORMAL LOW (ref 50.0–100.0)

## 2021-12-28 LAB — FERRITIN: Ferritin: 3 ng/mL — ABNORMAL LOW (ref 11–307)

## 2021-12-28 MED ORDER — POLYETHYLENE GLYCOL 3350 17 G PO PACK
17.0000 g | PACK | Freq: Every day | ORAL | Status: DC
  Administered 2021-12-28 – 2022-01-22 (×22): 17 g via ORAL
  Filled 2021-12-28 (×27): qty 1

## 2021-12-28 MED ORDER — TAB-A-VITE/IRON PO TABS
1.0000 | ORAL_TABLET | Freq: Every day | ORAL | Status: DC
  Administered 2021-12-28 – 2022-01-23 (×27): 1 via ORAL
  Filled 2021-12-28 (×27): qty 1

## 2021-12-28 NOTE — Progress Notes (Signed)
Per Kevin Fenton, [facility] social work Librarian, academic, their attorney determined the patient has the capacity to make placement decisions and they will not be seeking guardianship.

## 2021-12-28 NOTE — ED Provider Notes (Signed)
Behavioral Health Progress Note  Date and Time: 12/28/2021 2:06 PM Name: Teresa Murillo MRN:  [id number]  HPI and UC Course: Teresa Murillo is a 61 y.o. female with a past psychiatric history of schizophrenia, aggressive behavior, and possible intellectual disability presenting to [facility] on 11/23/21 voluntarily as a walk in via [facility] with complaints that she was locked out of the group home. Patient has been dismissed from group home due to multiple elopements. Pt had already been discharged from this facility but had been living there temporarily while DSS was looking for new placement. Pt is boarding at [facility].   Completed an AID to Capacity Evaluation (ACE) with attending Dr. Dwyane Dee on 12/11/2021.  Please see media tab for full details.    Completed test with [name], PhD: Wechsler Adult Intelligence Scale-4, Teresa Murillo achieved a full-scale IQ score of 73 and a percentile rank of 4 placing her in the borderline range of intellectual functioning (12/14/2021) Please see consult note from [name], PhD on 12/14/2021   Subjective:  Patient was initially seen sleeping, awoken easily, no acute distress.  Patient said that she is doing good, has no questions or concerns.  She inquired about housing, informed patient that there has been no update yet.    UX:[id number] Thoughts: No (Contracted to safety) VO:[id number] Thoughts: No VQQ:VZDGLOVFIEPPIR: None Ideas of JJO:ACZY   Mood: Euthymic Sleep:Good Appetite: Good   Review of Systems  Respiratory:  Negative for shortness of breath.   Cardiovascular:  Negative for chest pain.  Gastrointestinal:  Negative for abdominal pain, constipation, diarrhea, nausea and vomiting.  Musculoskeletal:  Negative for myalgias.  Neurological:  Negative for dizziness and headaches.      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 On the Wechsler Adult Intelligence Scale-4, Teresa Murillo achieved a full-scale IQ score of 73 and a percentile rank of 4 placing her in the borderline range of intellectual functioning.    Chronic obstructive pulmonary disease (COPD) (HCC)    Glaucoma    Hyperlipidemia    Hypertension    Iron deficiency    Schizoaffective disorder (HCC)    Type 2 diabetes mellitus (HCC)     Past Surgical History:  Procedure Laterality Date   TUBAL LIGATION     Family History:  Family History  Problem Relation Age of Onset   Breast cancer Maternal Grandmother    Family Psychiatric  History: None reported Social History:  Social History   Substance and Sexual Activity  Alcohol Use Yes     Social History   Substance and Sexual Activity  Drug Use Not Currently    Social History   Socioeconomic History   Marital status: Divorced    Spouse name: Not on file   Number of children: Not on file   Years of education: Not on file   Highest education level: Not on file  Occupational History   Not on file  Tobacco Use   Smoking status: Every Day    Packs/day: 1.00    Types: Cigars, Cigarettes   Smokeless tobacco: Current  Vaping Use   Vaping Use: Never used  Substance and Sexual Activity   Alcohol  use: Yes   Drug use: Not Currently   Sexual activity: Not Currently    Birth control/protection: Surgical  Other Topics Concern   Not on file  Social History Narrative   Not on file   Social Determinants of Health   Financial Resource Strain: Not on file  Food Insecurity: Not on file  Transportation Needs: Not on file  Physical Activity: Not on file  Stress: Not on file  Social Connections: Not on file    SDOH:  SDOH Screenings   Depression (PHQ2-9): Low Risk  (11/23/2021)  Tobacco Use: High Risk (12/14/2021)   Additional Social History:    Pain Medications: See MAR Prescriptions: See MAR Over the Counter: See MAR History of alcohol / drug use?: No history of alcohol / drug abuse Longest period of sobriety (when/how long): N/A                    Current Medications:  Current Facility-Administered Medications  Medication Dose Route Frequency Provider Last Rate Last Admin   acetaminophen (TYLENOL) tablet 650 mg  650 mg Oral Q6H PRN Rankin, Shuvon B, NP   650 mg at 12/25/21 2200   albuterol (VENTOLIN HFA) 108 (90 Base) MCG/ACT inhaler 2 puff  2 puff Inhalation Q6H PRN Rankin, Shuvon B, NP   2 puff at 12/09/21 2102   alum & mag hydroxide-simeth (MAALOX/MYLANTA) 200-200-20 MG/5ML suspension 30 mL  30 mL Oral Q4H PRN Rankin, Shuvon B, NP   30 mL at 12/19/21 2109   apixaban (ELIQUIS) tablet 5 mg  5 mg Oral BID Rankin, Shuvon B, NP   5 mg at 12/28/21 0902   cloZAPine (CLOZARIL) tablet 50 mg  50 mg Oral Daily [name], NP   50 mg at 12/28/21 0902   cloZAPine (CLOZARIL) tablet 75 mg  75 mg Oral QHS [name], NP   75 mg at 12/27/21 2122   diltiazem (CARDIZEM CD) 24 hr capsule 240 mg  240 mg Oral Daily Rankin, Shuvon B, NP   240 mg at 12/28/21 0903   divalproex (DEPAKOTE ER) 24 hr tablet 500 mg  500 mg Oral BID Rankin, Shuvon B, NP   500 mg at 12/28/21 0903   fluticasone furoate-vilanterol (BREO ELLIPTA) 200-25 MCG/ACT 1 puff  1 puff Inhalation Daily Rankin, Shuvon B, NP   1 puff at 12/28/21 0904   haloperidol (HALDOL) tablet 10 mg  10 mg Oral Q8H PRN Rankin, Shuvon B, NP       insulin aspart (novoLOG) injection 0-9 Units  0-9 Units Subcutaneous TID WC Tharon Aquas, NP   3 Units at 12/28/21 1133   insulin glargine-yfgn (SEMGLEE) injection 10 Units  10 Units Subcutaneous Daily [name], DO   10 Units at 12/28/21 0904   latanoprost (XALATAN) 0.005 % ophthalmic solution  1 drop  1 drop Both Eyes QHS Rankin, Shuvon B, NP   1 drop at 12/27/21 2312   magnesium hydroxide (MILK OF MAGNESIA) suspension 30 mL  30 mL Oral Daily PRN Rankin, Shuvon B, NP       menthol-cetylpyridinium (CEPACOL) lozenge 3 mg  1 lozenge Oral Once [name], NP       menthol-cetylpyridinium (CEPACOL) lozenge 3 mg  1 lozenge Oral PRN [name], NP   3 mg at 12/27/21 2313   metFORMIN (GLUCOPHAGE-XR) 24 hr tablet 1,000 mg  1,000 mg Oral BID WC [name], DO   1,000 mg at 12/28/21 0904   nicotine (NICODERM CQ - dosed in mg/24  hr) patch 7 mg  7 mg Transdermal Daily Rankin, Shuvon B, NP   7 mg at 12/28/21 0905   Vitamin D (Ergocalciferol) (DRISDOL) 1.25 MG (50000 UNIT) capsule 50,000 Units  50,000 Units Oral Q7 days Rankin, Shuvon B, NP   50,000 Units at 12/24/21 9150   Current Outpatient Medications  Medication Sig Dispense Refill   Accu-Chek Softclix Lancets lancets Use as directed up to four times daily 100 each 0   apixaban (ELIQUIS) 5 MG TABS tablet Take 1 tablet (5 mg total) by mouth 2 (two) times daily. 60 tablet 0   ARIPiprazole (ABILIFY) 10 MG tablet Take 1 tablet (10 mg total) by mouth daily. 30 tablet 0   Blood Glucose Monitoring Suppl (ACCU-CHEK GUIDE) w/Device KIT Use as directed up to four times daily 1 kit 0   budesonide-formoterol (SYMBICORT) 160-4.5 MCG/ACT inhaler Inhale 2 puffs into the lungs in the morning and at bedtime.     Cholecalciferol (VITAMIN D3) 1.25 MG (50000 UT) CAPS Take 50,000 Units by mouth every Thursday.     cloZAPine (CLOZARIL) 25 MG tablet Take 3 tablets (75 mg total) by mouth at bedtime. 90 tablet 0   clozapine (CLOZARIL) 50 MG tablet Take 1 tablet (50 mg total) by mouth daily. 30 tablet 0   diltiazem (CARDIZEM CD) 240 MG 24 hr capsule Take 1 capsule (240 mg total) by mouth daily. (Patient not taking: Reported on 11/24/2021) 30 capsule 0   divalproex (DEPAKOTE ER) 500 MG 24 hr tablet Take 1 tablet (500 mg total) by mouth 2 (two) times daily. 60 tablet  0   glucose blood test strip Use as directed up to four times daily 50 each 0   haloperidol (HALDOL) 10 MG tablet Take 1 tablet (10 mg total) by mouth 3 (three) times daily as needed for agitation (and psychotic symptoms).     INGREZZA 40 MG capsule Take 1 capsule (40 mg total) by mouth in the morning. 30 capsule 0   insulin aspart (NOVOLOG) 100 UNIT/ML FlexPen Before each meal 3 times a day, 140-199 - 2 units, 200-250 - 4 units, 251-299 - 6 units,  300-349 - 8 units,  350 or above 10 units. Insulin PEN if approved, provide syringes and needles if needed.Please switch to any approved short acting Insulin if needed. 15 mL 0   insulin glargine (LANTUS) 100 UNIT/ML Solostar Pen Inject 12 Units into the skin daily. 15 mL 0   Insulin Pen Needle 32G X 4 MM MISC Use 4 times a day with insulin, 1 month supply. 100 each 0   latanoprost (XALATAN) 0.005 % ophthalmic solution Place 1 drop into both eyes at bedtime.     metFORMIN (GLUCOPHAGE) 500 MG tablet Take 500 mg by mouth 2 (two) times daily with a meal.     nicotine (NICODERM CQ - DOSED IN MG/24 HR) 7 mg/24hr patch Place 1 patch (7 mg total) onto the skin daily. 28 patch 0   PROAIR HFA 108 (90 Base) MCG/ACT inhaler Inhale 2 puffs into the lungs every 6 (six) hours as needed for wheezing or shortness of breath.      Labs  Lab Results:  No results displayed because visit has over 200 results.    Admission on 11/22/2021, Discharged on 11/22/2021  Component Date Value Ref Range Status   Glucose-Capillary 11/22/2021 169 (H)  70 - 99 mg/dL Final   Glucose reference range applies only to samples taken after fasting for at least 8 hours.  No results displayed  because visit has over 200 results.    Admission on 10/04/2021, Discharged on 10/22/2021  Component Date Value Ref Range Status   SARS Coronavirus 2 by RT PCR 10/04/2021 NEGATIVE  NEGATIVE Final   Comment: (NOTE) SARS-CoV-2 target nucleic acids are NOT DETECTED.  The SARS-CoV-2 RNA is generally  detectable in upper respiratory specimens during the acute phase of infection. The lowest concentration of SARS-CoV-2 viral copies this assay can detect is 138 copies/mL. A negative result does not preclude SARS-Cov-2 infection and should not be used as the sole basis for treatment or other patient management decisions. A negative result may occur with  improper specimen collection/handling, submission of specimen other than nasopharyngeal swab, presence of viral mutation(s) within the areas targeted by this assay, and inadequate number of viral copies(<138 copies/mL). A negative result must be combined with clinical observations, patient history, and epidemiological information. The expected result is Negative.  Fact Sheet for Patients:  EntrepreneurPulse.com.au  Fact Sheet for Healthcare Providers:  IncredibleEmployment.be  This test is no                          t yet approved or cleared by the [place] FDA and  has been authorized for detection and/or diagnosis of SARS-CoV-2 by FDA under an Emergency Use Authorization (EUA). This EUA will remain  in effect (meaning this test can be used) for the duration of the COVID-19 declaration under Section 564(b)(1) of the Act, 21 U.S.C.section 360bbb-3(b)(1), unless the authorization is terminated  or revoked sooner.       Influenza A by PCR 10/04/2021 NEGATIVE  NEGATIVE Final   Influenza B by PCR 10/04/2021 NEGATIVE  NEGATIVE Final   Comment: (NOTE) The Xpert Xpress SARS-CoV-2/FLU/RSV plus assay is intended as an aid in the diagnosis of influenza from Nasopharyngeal swab specimens and should not be used as a sole basis for treatment. Nasal washings and aspirates are unacceptable for Xpert Xpress SARS-CoV-2/FLU/RSV testing.  Fact Sheet for Patients: EntrepreneurPulse.com.au  Fact Sheet for Healthcare Providers: IncredibleEmployment.be  This test is not  yet approved or cleared by the [place] FDA and has been authorized for detection and/or diagnosis of SARS-CoV-2 by FDA under an Emergency Use Authorization (EUA). This EUA will remain in effect (meaning this test can be used) for the duration of the COVID-19 declaration under Section 564(b)(1) of the Act, 21 U.S.C. section 360bbb-3(b)(1), unless the authorization is terminated or revoked.  Performed at [facility], [place] [address], [place], [place] [address]    WBC 10/04/2021 8.4  4.0 - 10.5 K/uL Final   RBC 10/04/2021 4.42  3.87 - 5.11 MIL/uL Final   Hemoglobin 10/04/2021 11.6 (L)  12.0 - 15.0 g/dL Final   HCT 10/04/2021 35.7 (L)  36.0 - 46.0 % Final   MCV 10/04/2021 80.8  80.0 - 100.0 fL Final   MCH 10/04/2021 26.2  26.0 - 34.0 pg Final   MCHC 10/04/2021 32.5  30.0 - 36.0 g/dL Final   RDW 10/04/2021 16.0 (H)  11.5 - 15.5 % Final   Platelets 10/04/2021 372  150 - 400 K/uL Final   nRBC 10/04/2021 0.0  0.0 - 0.2 % Final   Neutrophils Relative % 10/04/2021 68  % Final   Neutro Abs 10/04/2021 5.7  1.7 - 7.7 K/uL Final   Lymphocytes Relative 10/04/2021 22  % Final   Lymphs Abs 10/04/2021 1.8  0.7 - 4.0 K/uL Final   Monocytes Relative 10/04/2021 8  %  Final   Monocytes Absolute 10/04/2021 0.7  0.1 - 1.0 K/uL Final   Eosinophils Relative 10/04/2021 1  % Final   Eosinophils Absolute 10/04/2021 0.1  0.0 - 0.5 K/uL Final   Basophils Relative 10/04/2021 1  % Final   Basophils Absolute 10/04/2021 0.1  0.0 - 0.1 K/uL Final   Immature Granulocytes 10/04/2021 0  % Final   Abs Immature Granulocytes 10/04/2021 0.03  0.00 - 0.07 K/uL Final   Performed at [facility], [place] [address] [place], [place] [address]   Sodium 10/04/2021 136  135 - 145 mmol/L Final   Potassium 10/04/2021 4.2  3.5 - 5.1 mmol/L Final   Chloride 10/04/2021 104  98 - 111 mmol/L Final   CO2 10/04/2021 25  22 - 32 mmol/L Final   Glucose, Bld 10/04/2021 100 (H)  70 - 99 mg/dL Final   Glucose reference range  applies only to samples taken after fasting for at least 8 hours.   BUN 10/04/2021 9  6 - 20 mg/dL Final   Creatinine, Ser 10/04/2021 0.52  0.44 - 1.00 mg/dL Final   Calcium 10/04/2021 9.0  8.9 - 10.3 mg/dL Final   Total Protein 10/04/2021 7.0  6.5 - 8.1 g/dL Final   Albumin 10/04/2021 3.2 (L)  3.5 - 5.0 g/dL Final   AST 10/04/2021 13 (L)  15 - 41 U/L Final   ALT 10/04/2021 10  0 - 44 U/L Final   Alkaline Phosphatase 10/04/2021 61  38 - 126 U/L Final   Total Bilirubin 10/04/2021 0.3  0.3 - 1.2 mg/dL Final   GFR, Estimated 10/04/2021 >60  >60 mL/min Final   Comment: (NOTE) Calculated using the CKD-EPI Creatinine Equation (2021)    Anion gap 10/04/2021 7  5 - 15 Final   Performed at [place] [address] [place] [address], [place], [place] [address]   Hgb A1c MFr Bld 10/04/2021 7.0 (H)  4.8 - 5.6 % Final   Comment: (NOTE) Pre diabetes:          5.7%-6.4%  Diabetes:              >6.4%  Glycemic control for   <7.0% adults with diabetes    Mean Plasma Glucose 10/04/2021 154.2  mg/dL Final   Performed at [facility], [place] [address] [place], [place] [address]   Cholesterol 10/04/2021 178  0 - 200 mg/dL Final   Triglycerides 10/04/2021 155 (H)  <150 mg/dL Final   HDL 10/04/2021 52  >40 mg/dL Final   Total CHOL/HDL Ratio 10/04/2021 3.4  RATIO Final   VLDL 10/04/2021 31  0 - 40 mg/dL Final   LDL Cholesterol 10/04/2021 95  0 - 99 mg/dL Final   Comment:        Total Cholesterol/HDL:CHD Risk Coronary Heart Disease Risk Table                     Men   Women  1/2 Average Risk   3.4   3.3  Average Risk       5.0   4.4  2 X Average Risk   9.6   7.1  3 X Average Risk  23.4   11.0        Use the calculated Patient Ratio above and the CHD Risk Table to determine the patient's CHD Risk.        ATP III CLASSIFICATION (LDL):  <100     mg/dL   Optimal  100-129  mg/dL   Near or Above                    Optimal  130-159  mg/dL   Borderline  160-189  mg/dL   High  >190     mg/dL   Very  High Performed at [facility] [address] [place], [place] [address]    POC Amphetamine UR 10/04/2021 None Detected  NONE DETECTED (Cut Off Level 1000 ng/mL) Final   POC Secobarbital (BAR) 10/04/2021 None Detected  NONE DETECTED (Cut Off Level 300 ng/mL) Final   POC Buprenorphine (BUP) 10/04/2021 None Detected  NONE DETECTED (Cut Off Level 10 ng/mL) Final   POC Oxazepam (BZO) 10/04/2021 None Detected  NONE DETECTED (Cut Off Level 300 ng/mL) Final   POC Cocaine UR 10/04/2021 None Detected  NONE DETECTED (Cut Off Level 300 ng/mL) Final   POC Methamphetamine UR 10/04/2021 None Detected  NONE DETECTED (Cut Off Level 1000 ng/mL) Final   POC Morphine 10/04/2021 None Detected  NONE DETECTED (Cut Off Level 300 ng/mL) Final   POC Methadone UR 10/04/2021 None Detected  NONE DETECTED (Cut Off Level 300 ng/mL) Final   POC Oxycodone UR 10/04/2021 Positive (A)  NONE DETECTED (Cut Off Level 100 ng/mL) Final   POC Marijuana UR 10/04/2021 None Detected  NONE DETECTED (Cut Off Level 50 ng/mL) Final   SARSCOV2ONAVIRUS 2 AG 10/04/2021 NEGATIVE  NEGATIVE Final   Comment: (NOTE) SARS-CoV-2 antigen NOT DETECTED.   Negative results are presumptive.  Negative results do not preclude SARS-CoV-2 infection and should not be used as the sole basis for treatment or other patient management decisions, including infection  control decisions, particularly in the presence of clinical signs and  symptoms consistent with COVID-19, or in those who have been in contact with the virus.  Negative results must be combined with clinical observations, patient history, and epidemiological information. The expected result is Negative.  Fact Sheet for Patients: HandmadeRecipes.com.cy  Fact Sheet for Healthcare Providers: FuneralLife.at  This test is not yet approved or cleared by the [place] FDA and  has been authorized for detection and/or diagnosis of SARS-CoV-2  by FDA under an Emergency Use Authorization (EUA).  This EUA will remain in effect (meaning this test can be used) for the duration of  the COV                          ID-19 declaration under Section 564(b)(1) of the Act, 21 U.S.C. section 360bbb-3(b)(1), unless the authorization is terminated or revoked sooner.     Valproic Acid Lvl 10/04/2021 51  50.0 - 100.0 ug/mL Final   Performed at [facility] [address], [place], [place] [address]   Valproic Acid Lvl 10/08/2021 57  50.0 - 100.0 ug/mL Final   Performed at [place] [address], [place], [place] [address]   Glucose-Capillary 10/09/2021 123 (H)  70 - 99 mg/dL Final   Glucose reference range applies only to samples taken after fasting for at least 8 hours.  Admission on 09/09/2021, Discharged on 09/10/2021  Component Date Value Ref Range Status   Sodium 09/09/2021 134 (L)  135 - 145 mmol/L Final   Potassium 09/09/2021 4.3  3.5 - 5.1 mmol/L Final   Chloride 09/09/2021 99  98 - 111 mmol/L Final   CO2 09/09/2021 25  22 - 32 mmol/L Final   Glucose, Bld 09/09/2021 107 (H)  70 - 99  mg/dL Final   Glucose reference range applies only to samples taken after fasting for at least 8 hours.   BUN 09/09/2021 12  6 - 20 mg/dL Final   Creatinine, Ser 09/09/2021 0.74  0.44 - 1.00 mg/dL Final   Calcium 09/09/2021 9.0  8.9 - 10.3 mg/dL Final   Total Protein 09/09/2021 7.4  6.5 - 8.1 g/dL Final   Albumin 09/09/2021 3.1 (L)  3.5 - 5.0 g/dL Final   AST 09/09/2021 13 (L)  15 - 41 U/L Final   ALT 09/09/2021 13  0 - 44 U/L Final   Alkaline Phosphatase 09/09/2021 66  38 - 126 U/L Final   Total Bilirubin 09/09/2021 0.2 (L)  0.3 - 1.2 mg/dL Final   GFR, Estimated 09/09/2021 >60  >60 mL/min Final   Comment: (NOTE) Calculated using the CKD-EPI Creatinine Equation (2021)    Anion gap 09/09/2021 10  5 - 15 Final   Performed at [facility], [place] [address] [place], [place] [address]   WBC 09/09/2021 8.5  4.0 - 10.5 K/uL Final    RBC 09/09/2021 4.53  3.87 - 5.11 MIL/uL Final   Hemoglobin 09/09/2021 11.8 (L)  12.0 - 15.0 g/dL Final   HCT 09/09/2021 38.0  36.0 - 46.0 % Final   MCV 09/09/2021 83.9  80.0 - 100.0 fL Final   MCH 09/09/2021 26.0  26.0 - 34.0 pg Final   MCHC 09/09/2021 31.1  30.0 - 36.0 g/dL Final   RDW 09/09/2021 17.8 (H)  11.5 - 15.5 % Final   Platelets 09/09/2021 442 (H)  150 - 400 K/uL Final   nRBC 09/09/2021 0.0  0.0 - 0.2 % Final   Neutrophils Relative % 09/09/2021 70  % Final   Neutro Abs 09/09/2021 5.9  1.7 - 7.7 K/uL Final   Lymphocytes Relative 09/09/2021 20  % Final   Lymphs Abs 09/09/2021 1.7  0.7 - 4.0 K/uL Final   Monocytes Relative 09/09/2021 8  % Final   Monocytes Absolute 09/09/2021 0.7  0.1 - 1.0 K/uL Final   Eosinophils Relative 09/09/2021 1  % Final   Eosinophils Absolute 09/09/2021 0.1  0.0 - 0.5 K/uL Final   Basophils Relative 09/09/2021 1  % Final   Basophils Absolute 09/09/2021 0.1  0.0 - 0.1 K/uL Final   Immature Granulocytes 09/09/2021 0  % Final   Abs Immature Granulocytes 09/09/2021 0.03  0.00 - 0.07 K/uL Final   Performed at [facility], [place] [address], [place], [place] [address]   Ammonia 09/09/2021 37 (H)  9 - 35 umol/L Final   Comment: HEMOLYSIS AT THIS LEVEL MAY AFFECT RESULT Performed at [facility], [place] [address], [place], [place] [address]    Opiates 09/09/2021 NONE DETECTED  NONE DETECTED Final   Cocaine 09/09/2021 NONE DETECTED  NONE DETECTED Final   Benzodiazepines 09/09/2021 NONE DETECTED  NONE DETECTED Final   Amphetamines 09/09/2021 NONE DETECTED  NONE DETECTED Final   Tetrahydrocannabinol 09/09/2021 NONE DETECTED  NONE DETECTED Final   Barbiturates 09/09/2021 NONE DETECTED  NONE DETECTED Final   Comment: (NOTE) DRUG SCREEN FOR MEDICAL PURPOSES ONLY.  IF CONFIRMATION IS NEEDED FOR ANY PURPOSE, NOTIFY LAB WITHIN 5 DAYS.  LOWEST DETECTABLE LIMITS FOR URINE DRUG SCREEN Drug Class                     Cutoff (ng/mL) Amphetamine and  metabolites    1000 Barbiturate and metabolites    200 Benzodiazepine  143 Tricyclics and metabolites     300 Opiates and metabolites        300 Cocaine and metabolites        300 THC                            50 Performed at [facility], [place] [address], [place], [place] [address]    Alcohol, Ethyl (B) 09/09/2021 <10  <10 mg/dL Final   Comment: (NOTE) Lowest detectable limit for serum alcohol is 10 mg/dL.  For medical purposes only. Performed at [facility], [place] [address] [place], [place] [address]    Lipase 09/09/2021 33  11 - 51 U/L Final   Performed at [facility] [address] [place], [place] [address]   Color, Urine 09/09/2021 COLORLESS (A)  YELLOW Final   APPearance 09/09/2021 CLEAR  CLEAR Final   Specific Gravity, Urine 09/09/2021 1.002 (L)  1.005 - 1.030 Final   pH 09/09/2021 6.0  5.0 - 8.0 Final   Glucose, UA 09/09/2021 NEGATIVE  NEGATIVE mg/dL Final   Hgb urine dipstick 09/09/2021 NEGATIVE  NEGATIVE Final   Bilirubin Urine 09/09/2021 NEGATIVE  NEGATIVE Final   Ketones, ur 09/09/2021 NEGATIVE  NEGATIVE mg/dL Final   Protein, ur 09/09/2021 NEGATIVE  NEGATIVE mg/dL Final   Nitrite 09/09/2021 NEGATIVE  NEGATIVE Final   Leukocytes,Ua 09/09/2021 NEGATIVE  NEGATIVE Final   Performed at [facility] [address] [place], [place] [address]   Specimen Description 09/09/2021 URINE, CLEAN CATCH   Final   Special Requests 09/09/2021 NONE   Final   Culture 09/09/2021  (A)   Final                   Value:<10,000 COLONIES/mL INSIGNIFICANT GROWTH Performed at [facility], [place] [address] [place], [place] [address]    Report Status 09/09/2021 09/10/2021 FINAL   Final   D-Dimer, Quant 09/09/2021 0.92 (H)  0.00 - 0.50 ug/mL-FEU Final   Comment: (NOTE) At the manufacturer cut-off value of 0.5 g/mL FEU, this assay has a negative predictive value of 95-100%.This assay is intended for use in conjunction with a clinical pretest  probability (PTP) assessment model to exclude pulmonary embolism (PE) and deep venous thrombosis (DVT) in outpatients suspected of PE or DVT. Results should be correlated with clinical presentation. Performed at [facility], [place] [address], [place], [place] [address]    Troponin I (High Sensitivity) 09/09/2021 5  <18 ng/L Final   Comment: (NOTE) Elevated high sensitivity troponin I (hsTnI) values and significant  changes across serial measurements may suggest ACS but many other  chronic and acute conditions are known to elevate hsTnI results.  Refer to the "Links" section for chest pain algorithms and additional  guidance. Performed at [facility], [place] [address] [place], [place] [address]    BP 09/10/2021 135/83  mmHg Final   S' Lateral 09/10/2021 2.30  cm Final   Area-P 1/2 09/10/2021 5.58  cm2 Final    Blood Alcohol level:  Lab Results  Component Value Date   ETH <10 11/23/2021   ETH <10 74/73/4037    Metabolic Disorder Labs: Lab Results  Component Value Date   HGBA1C 7.0 (H) 10/04/2021   MPG 154.2 10/04/2021   Lab Results  Component Value Date   PROLACTIN 3.8 (L) 11/23/2021   Lab Results  Component Value Date   CHOL  171 11/23/2021   TRIG 125 11/23/2021   HDL 65 11/23/2021   CHOLHDL 2.6 11/23/2021   VLDL 25 11/23/2021   LDLCALC 81 11/23/2021   LDLCALC 95 10/04/2021    Therapeutic Lab Levels: No results found for: "LITHIUM" Lab Results  Component Value Date   VALPROATE 54 12/03/2021   VALPROATE 45 (L) 11/09/2021   No results found for: "CBMZ"  Physical Findings   PHQ2-9    [place] ED from 11/23/2021 in [facility]  PHQ-2 Total Score 2  PHQ-9 Total Score 3      Flowsheet Row ED from 11/23/2021 in [facility] Most recent reading at 11/23/2021  6:44 PM ED from 11/22/2021 in [facility] Most recent reading at 11/22/2021  8:28 PM ED from  11/22/2021 in [place] [facility] Most recent reading at 11/22/2021  1:24 AM  C-SSRS RISK CATEGORY No Risk No Risk No Risk        Musculoskeletal  Strength & Muscle Tone: within normal limits Gait & Station: normal Patient leans: N/A  Psychiatric Specialty Exam  Presentation  General Appearance:  Disheveled  Eye Contact: Fair  Speech: Clear and Coherent; Normal Rate  Speech Volume: Normal  Handedness: Left   Mood and Affect  Mood: Euthymic  Affect: Constricted   Thought Process  Thought Processes: Coherent; Goal Directed; Linear  Descriptions of Associations:Intact  Orientation:Full (Time, Place and Person)  Thought Content:WDL  Diagnosis of Schizophrenia or Schizoaffective disorder in past: Yes  Duration of Psychotic Symptoms: NA  Hallucinations:Denied AVH Ideas of Reference:None  Suicidal Thoughts:Denied SI Homicidal Thoughts:Denied HI  Sensorium  Memory: Immediate Good  Judgment: Fair  Insight: Fair   Community education officer  Concentration: Good  Attention Span: Good  Recall: Good  Fund of Knowledge: Good  Language: Good   Psychomotor Activity  Psychomotor Activity:Normal. AIMS 0  Assets  Assets: Communication Skills; Desire for Improvement; Financial Resources/Insurance; Resilience   Sleep  Sleep:Good   Physical Exam  Physical Exam Vitals and nursing note reviewed.  Constitutional:      General: She is not in acute distress.    Appearance: She is not ill-appearing or diaphoretic.  HENT:     Head: Normocephalic and atraumatic.  Pulmonary:     Effort: Pulmonary effort is normal. No respiratory distress.  Neurological:     General: No focal deficit present.     Mental Status: She is alert.     Blood pressure 124/63, pulse (!) 108, temperature 97.8 F (36.6 C), temperature source Oral, resp. rate 18, SpO2 98 %. There is no height or weight on file to calculate BMI.  Treatment Plan  Summary: Tyishia remains cleared by psychiatry.   [facility] team along with [facility] health care manager and case manager continue to seek placement for [place].   Dx: SCZ, borderline intellectual functioning, DM (insulin dependent), HLD, COPD, glaucoma, tobacco use d/o   Appreciate pharmacy for assistance with clozapine labs -  weekly ANC monitoring   Continue home meds per below. apixaban, 5 mg, BID clozapine, 50 mg, Daily cloZAPine, 75 mg, QHS diltiazem, 240 mg, Daily divalproex, 500 mg, BID fluticasone furoate-vilanterol, 1 puff, Daily insulin aspart, 0-9 Units, TID WC insulin glargine-yfgn, 10 Units, Daily latanoprost, 1 drop, QHS menthol-cetylpyridinium, 1 lozenge, Once metFORMIN, 1,000 mg, BID WC nicotine, 7 mg, Daily Vitamin D (Ergocalciferol), 50,000 Units, Q7 days    Dispo: Tentative Date: TBA Location: TBA Barrier: Pending accepting facility with nursing capabilities  to assist patient with administering insulin  Signed: [name], DO Psychiatry Resident, PGY-2 [facility]  12/28/2021, 2:06 PM

## 2021-12-28 NOTE — ED Notes (Signed)
Pt sleeping at present, no distress noted,  Monitoring for safety. 

## 2021-12-28 NOTE — ED Notes (Signed)
Pt resting quietly. Denies SI, HI, pain, and AVH.  Breathing is even and unlabored.  Will continue to monitor for safety.

## 2021-12-28 NOTE — ED Notes (Signed)
Pt provided word searches and coloring pages.   

## 2021-12-28 NOTE — ED Notes (Signed)
Pt is sleeping. No distress noted. Will continue to monitor safety. 

## 2021-12-28 NOTE — ED Notes (Signed)
Pt is in the bed resting. Respirations are even and unlabored. No acute distress noted. Will continue to monitor for safety 

## 2021-12-28 NOTE — ED Notes (Signed)
Labs drawn per provider orders.  Abnormal results have been reported to provider.

## 2021-12-28 NOTE — ED Notes (Signed)
Pt resting quietly in bed.  Reports feeling more tired then usual.  Breathing is even and unlabored.  Will continue to monitor for safety.

## 2021-12-28 NOTE — Care Management (Signed)
Care Management   The [facility] owner [phone]) reports that the patient is denied placement at the facility due to a lack of resources.   The [facility] owner reports that more money would have to be provided so that additional staffing can be put in place due to the patient's past history of wandering and leaving the facilities.    Writer informed the Tourist information centre manager at [employer] Marita [place]).  Per Marita [place], she will continue to loojk for additional placement facilities.

## 2021-12-29 DIAGNOSIS — F200 Paranoid schizophrenia: Secondary | ICD-10-CM | POA: Diagnosis not present

## 2021-12-29 LAB — CBC WITH DIFFERENTIAL/PLATELET
Abs Immature Granulocytes: 0.01 10*3/uL (ref 0.00–0.07)
Basophils Absolute: 0.1 10*3/uL (ref 0.0–0.1)
Basophils Relative: 1 %
Eosinophils Absolute: 0.1 10*3/uL (ref 0.0–0.5)
Eosinophils Relative: 2 %
HCT: 37 % (ref 36.0–46.0)
Hemoglobin: 11.7 g/dL — ABNORMAL LOW (ref 12.0–15.0)
Immature Granulocytes: 0 %
Lymphocytes Relative: 32 %
Lymphs Abs: 1.9 10*3/uL (ref 0.7–4.0)
MCH: 25.1 pg — ABNORMAL LOW (ref 26.0–34.0)
MCHC: 31.6 g/dL (ref 30.0–36.0)
MCV: 79.2 fL — ABNORMAL LOW (ref 80.0–100.0)
Monocytes Absolute: 0.7 10*3/uL (ref 0.1–1.0)
Monocytes Relative: 11 %
Neutro Abs: 3.1 10*3/uL (ref 1.7–7.7)
Neutrophils Relative %: 54 %
Platelets: 304 10*3/uL (ref 150–400)
RBC: 4.67 MIL/uL (ref 3.87–5.11)
RDW: 17.6 % — ABNORMAL HIGH (ref 11.5–15.5)
WBC: 5.9 10*3/uL (ref 4.0–10.5)
nRBC: 0 % (ref 0.0–0.2)

## 2021-12-29 LAB — CLOZAPINE (CLOZARIL)
Clozapine Lvl: 118 ng/mL — ABNORMAL LOW (ref 350–600)
NorClozapine: 41 ng/mL
Total(Cloz+Norcloz): 159 ng/mL

## 2021-12-29 LAB — GLUCOSE, CAPILLARY
Glucose-Capillary: 108 mg/dL — ABNORMAL HIGH (ref 70–99)
Glucose-Capillary: 98 mg/dL (ref 70–99)
Glucose-Capillary: 129 mg/dL — ABNORMAL HIGH (ref 70–99)

## 2021-12-29 NOTE — Progress Notes (Signed)
Brief note: [facility]  10/31 [place] = [address] monitoring frequency= weekly  Next ANC due 11/7  Thanks!  [name] 12/29/2021 1:09 PM

## 2021-12-29 NOTE — ED Notes (Signed)
Pt is in the bed watching TV. Respirations are even and unlabored. No acute distress noted. Will continue to monitor for safety.  

## 2021-12-29 NOTE — ED Notes (Signed)
Pt is sleeping. No distress noted. Will continue to monitor safety. 

## 2021-12-29 NOTE — ED Provider Notes (Signed)
Behavioral Health Progress Note  Date and Time: 12/29/2021 11:09 AM Name: Teresa Murillo MRN:  [id number]  HPI and UC Course: Teresa Murillo is a 61 y.o. female with a past psychiatric history of schizophrenia, aggressive behavior, and possible intellectual disability presenting to Teresa Murillo on 11/23/21 voluntarily as a walk in via [facility] with complaints that she was locked out of the group home. Patient has been dismissed from group home due to multiple elopements. Pt had already been discharged from this facility but had been living there temporarily while DSS was looking for new placement. Pt is boarding at [facility].   Completed an AID to Capacity Evaluation (ACE) with attending Dr. Dwyane Dee on 12/11/2021.  Please see media tab for full details.    Completed test with [name], PhD: Wechsler Adult Intelligence Scale-4, Ms. Prime achieved a full-scale IQ score of 73 and a percentile rank of 4 placing her in the borderline range of intellectual functioning (12/14/2021) Please see consult note from [name], PhD on 12/14/2021   Subjective:  Patient was initially seen asleep in bed, no acute distress, awoken easily.  Patient denied SOB, chest pain, abdominal pain, diarrhea, constipation, dizziness, or other somatic complaints, see ROS below.  Stated that she feels good, aside from being sleepy.  Patient denied any associating symptoms or change that led to her being more sleepy.  Stated that she thinks it could be from her medications.  Patient again inquired about group housing.  Otherwise patient has no other questions or concerns.   CX:KGYJEHUD Thoughts: No (Contracted to safety) JS:HFWYOVZCH Thoughts: No YIF:OYDXAJOINOMVEH: None Ideas of MCN:OBSJ   Mood: Euthymic Sleep:Good Appetite: Good   Review of Systems  Constitutional:  Positive for malaise/fatigue. Negative for diaphoresis and fever.  Eyes:  Negative for blurred vision and double vision.  Respiratory:  Negative for  cough and shortness of breath.   Cardiovascular:  Negative for chest pain, palpitations and leg swelling.  Gastrointestinal:  Negative for abdominal pain, constipation, diarrhea, nausea and vomiting.  Musculoskeletal:  Negative for falls and myalgias.  Neurological:  Negative for dizziness, tremors, focal weakness and headaches.      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 On the Wechsler Adult Intelligence Scale-4, Ms. Cuff achieved a full-scale IQ score of 73 and a percentile rank of 4 placing her in the borderline range of intellectual functioning.    Chronic obstructive pulmonary disease (COPD) (HCC)    Glaucoma    Hyperlipidemia    Hypertension    Iron deficiency    Schizoaffective disorder (HCC)    Type 2 diabetes mellitus (HCC)     Past Surgical History:  Procedure Laterality Date   TUBAL LIGATION     Family History:  Family History  Problem Relation Age of Onset   Breast cancer Maternal Grandmother    Family Psychiatric  History: None reported Social History:  Social History   Substance and Sexual Activity  Alcohol Use Yes     Social History   Substance and Sexual Activity  Drug Use Not Currently    Social History   Socioeconomic History   Marital status: Divorced    Spouse name: Not  on file   Number of children: Not on file   Years of education: Not on file   Highest education level: Not on file  Occupational History   Not on file  Tobacco Use   Smoking status: Every Day    Packs/day: 1.00    Types: Cigars, Cigarettes   Smokeless tobacco: Current   Vaping Use   Vaping Use: Never used  Substance and Sexual Activity   Alcohol use: Yes   Drug use: Not Currently   Sexual activity: Not Currently    Birth control/protection: Surgical  Other Topics Concern   Not on file  Social History Narrative   Not on file   Social Determinants of Health   Financial Resource Strain: Not on file  Food Insecurity: Not on file  Transportation Needs: Not on file  Physical Activity: Not on file  Stress: Not on file  Social Connections: Not on file   SDOH:  SDOH Screenings   Depression (PHQ2-9): Low Risk  (11/23/2021)  Tobacco Use: High Risk (12/14/2021)   Additional Social History:    Pain Medications: See MAR Prescriptions: See MAR Over the Counter: See MAR History of alcohol / drug use?: No history of alcohol / drug abuse Longest period of sobriety (when/how long): N/A                    Current Medications:  Current Facility-Administered Medications  Medication Dose Route Frequency Provider Last Rate Last Admin   acetaminophen (TYLENOL) tablet 650 mg  650 mg Oral Q6H PRN Rankin, Shuvon B, NP   650 mg at 12/25/21 2200   albuterol (VENTOLIN HFA) 108 (90 Base) MCG/ACT inhaler 2 puff  2 puff Inhalation Q6H PRN Rankin, Shuvon B, NP   2 puff at 12/09/21 2102   alum & mag hydroxide-simeth (MAALOX/MYLANTA) 200-200-20 MG/5ML suspension 30 mL  30 mL Oral Q4H PRN Rankin, Shuvon B, NP   30 mL at 12/19/21 2109   apixaban (ELIQUIS) tablet 5 mg  5 mg Oral BID Rankin, Shuvon B, NP   5 mg at 12/29/21 0925   cloZAPine (CLOZARIL) tablet 50 mg  50 mg Oral Daily [name], NP   50 mg at 12/29/21 0925   cloZAPine (CLOZARIL) tablet 75 mg  75 mg Oral QHS [name], NP   75 mg at 12/28/21 2114   diltiazem (CARDIZEM CD) 24 hr capsule 240 mg  240 mg Oral Daily Rankin, Shuvon B, NP   240 mg at 12/29/21 0925   divalproex (DEPAKOTE ER) 24 hr tablet 500 mg  500 mg Oral BID Rankin, Shuvon B, NP   500 mg at 12/29/21 0925   fluticasone  furoate-vilanterol (BREO ELLIPTA) 200-25 MCG/ACT 1 puff  1 puff Inhalation Daily Rankin, Shuvon B, NP   1 puff at 12/29/21 0751   haloperidol (HALDOL) tablet 10 mg  10 mg Oral Q8H PRN Rankin, Shuvon B, NP       insulin aspart (novoLOG) injection 0-9 Units  0-9 Units Subcutaneous TID WC Tharon Aquas, NP   3 Units at 12/28/21 1133   insulin glargine-yfgn (SEMGLEE) injection 10 Units  10 Units Subcutaneous Daily [name], DO   10 Units at 12/29/21 0926   latanoprost (XALATAN) 0.005 % ophthalmic solution 1 drop  1 drop Both Eyes QHS Rankin, Shuvon B, NP   1 drop at 12/28/21 2114   magnesium hydroxide (MILK OF MAGNESIA) suspension 30 mL  30 mL Oral Daily PRN Rankin, Shuvon B, NP  menthol-cetylpyridinium (CEPACOL) lozenge 3 mg  1 lozenge Oral Once [name], NP       menthol-cetylpyridinium (CEPACOL) lozenge 3 mg  1 lozenge Oral PRN [name], NP   3 mg at 12/28/21 2354   metFORMIN (GLUCOPHAGE-XR) 24 hr tablet 1,000 mg  1,000 mg Oral BID WC [name], DO   1,000 mg at 12/29/21 0751   multivitamins with iron tablet 1 tablet  1 tablet Oral Daily [name], DO   1 tablet at 12/29/21 7092   nicotine (NICODERM CQ - dosed in mg/24 hr) patch 7 mg  7 mg Transdermal Daily Rankin, Shuvon B, NP   7 mg at 12/29/21 0924   polyethylene glycol (MIRALAX / GLYCOLAX) packet 17 g  17 g Oral Daily [name], DO   17 g at 12/29/21 9574   Vitamin D (Ergocalciferol) (DRISDOL) 1.25 MG (50000 UNIT) capsule 50,000 Units  50,000 Units Oral Q7 days Rankin, Shuvon B, NP   50,000 Units at 12/24/21 7340   Current Outpatient Medications  Medication Sig Dispense Refill   Accu-Chek Softclix Lancets lancets Use as directed up to four times daily 100 each 0   apixaban (ELIQUIS) 5 MG TABS tablet Take 1 tablet (5 mg total) by mouth 2 (two) times daily. 60 tablet 0   ARIPiprazole (ABILIFY) 10 MG tablet Take 1 tablet (10 mg total) by mouth daily. 30 tablet 0   Blood Glucose Monitoring Suppl (ACCU-CHEK  GUIDE) w/Device KIT Use as directed up to four times daily 1 kit 0   budesonide-formoterol (SYMBICORT) 160-4.5 MCG/ACT inhaler Inhale 2 puffs into the lungs in the morning and at bedtime.     Cholecalciferol (VITAMIN D3) 1.25 MG (50000 UT) CAPS Take 50,000 Units by mouth every Thursday.     cloZAPine (CLOZARIL) 25 MG tablet Take 3 tablets (75 mg total) by mouth at bedtime. 90 tablet 0   clozapine (CLOZARIL) 50 MG tablet Take 1 tablet (50 mg total) by mouth daily. 30 tablet 0   diltiazem (CARDIZEM CD) 240 MG 24 hr capsule Take 1 capsule (240 mg total) by mouth daily. (Patient not taking: Reported on 11/24/2021) 30 capsule 0   divalproex (DEPAKOTE ER) 500 MG 24 hr tablet Take 1 tablet (500 mg total) by mouth 2 (two) times daily. 60 tablet 0   glucose blood test strip Use as directed up to four times daily 50 each 0   haloperidol (HALDOL) 10 MG tablet Take 1 tablet (10 mg total) by mouth 3 (three) times daily as needed for agitation (and psychotic symptoms).     INGREZZA 40 MG capsule Take 1 capsule (40 mg total) by mouth in the morning. 30 capsule 0   insulin aspart (NOVOLOG) 100 UNIT/ML FlexPen Before each meal 3 times a day, 140-199 - 2 units, 200-250 - 4 units, 251-299 - 6 units,  300-349 - 8 units,  350 or above 10 units. Insulin PEN if approved, provide syringes and needles if needed.Please switch to any approved short acting Insulin if needed. 15 mL 0   insulin glargine (LANTUS) 100 UNIT/ML Solostar Pen Inject 12 Units into the skin daily. 15 mL 0   Insulin Pen Needle 32G X 4 MM MISC Use 4 times a day with insulin, 1 month supply. 100 each 0   latanoprost (XALATAN) 0.005 % ophthalmic solution Place 1 drop into both eyes at bedtime.     metFORMIN (GLUCOPHAGE) 500 MG tablet Take 500 mg by mouth 2 (two) times daily with a meal.  nicotine (NICODERM CQ - DOSED IN MG/24 HR) 7 mg/24hr patch Place 1 patch (7 mg total) onto the skin daily. 28 patch 0   PROAIR HFA 108 (90 Base) MCG/ACT inhaler Inhale 2  puffs into the lungs every 6 (six) hours as needed for wheezing or shortness of breath.      Labs  Lab Results:  No results displayed because visit has over 200 results.    Admission on 11/22/2021, Discharged on 11/22/2021  Component Date Value Ref Range Status   Glucose-Capillary 11/22/2021 169 (H)  70 - 99 mg/dL Final   Glucose reference range applies only to samples taken after fasting for at least 8 hours.  No results displayed because visit has over 200 results.    Admission on 10/04/2021, Discharged on 10/22/2021  Component Date Value Ref Range Status   SARS Coronavirus 2 by RT PCR 10/04/2021 NEGATIVE  NEGATIVE Final   Comment: (NOTE) SARS-CoV-2 target nucleic acids are NOT DETECTED.  The SARS-CoV-2 RNA is generally detectable in upper respiratory specimens during the acute phase of infection. The lowest concentration of SARS-CoV-2 viral copies this assay can detect is 138 copies/mL. A negative result does not preclude SARS-Cov-2 infection and should not be used as the sole basis for treatment or other patient management decisions. A negative result may occur with  improper specimen collection/handling, submission of specimen other than nasopharyngeal swab, presence of viral mutation(s) within the areas targeted by this assay, and inadequate number of viral copies(<138 copies/mL). A negative result must be combined with clinical observations, patient history, and epidemiological information. The expected result is Negative.  Fact Sheet for Patients:  EntrepreneurPulse.com.au  Fact Sheet for Healthcare Providers:  IncredibleEmployment.be  This test is no                          t yet approved or cleared by the [place] FDA and  has been authorized for detection and/or diagnosis of SARS-CoV-2 by FDA under an Emergency Use Authorization (EUA). This EUA will remain  in effect (meaning this test can be used) for the duration of  the COVID-19 declaration under Section 564(b)(1) of the Act, 21 U.S.C.section 360bbb-3(b)(1), unless the authorization is terminated  or revoked sooner.       Influenza A by PCR 10/04/2021 NEGATIVE  NEGATIVE Final   Influenza B by PCR 10/04/2021 NEGATIVE  NEGATIVE Final   Comment: (NOTE) The Xpert Xpress SARS-CoV-2/FLU/RSV plus assay is intended as an aid in the diagnosis of influenza from Nasopharyngeal swab specimens and should not be used as a sole basis for treatment. Nasal washings and aspirates are unacceptable for Xpert Xpress SARS-CoV-2/FLU/RSV testing.  Fact Sheet for Patients: EntrepreneurPulse.com.au  Fact Sheet for Healthcare Providers: IncredibleEmployment.be  This test is not yet approved or cleared by the [place] FDA and has been authorized for detection and/or diagnosis of SARS-CoV-2 by FDA under an Emergency Use Authorization (EUA). This EUA will remain in effect (meaning this test can be used) for the duration of the COVID-19 declaration under Section 564(b)(1) of the Act, 21 U.S.C. section 360bbb-3(b)(1), unless the authorization is terminated or revoked.  Performed at [facility], [place] [address], [place], [place] [address]    WBC 10/04/2021 8.4  4.0 - 10.5 K/uL Final   RBC 10/04/2021 4.42  3.87 - 5.11 MIL/uL Final   Hemoglobin 10/04/2021 11.6 (L)  12.0 - 15.0 g/dL Final   HCT 10/04/2021 35.7 (L)  36.0 - 46.0 %  Final   MCV 10/04/2021 80.8  80.0 - 100.0 fL Final   MCH 10/04/2021 26.2  26.0 - 34.0 pg Final   MCHC 10/04/2021 32.5  30.0 - 36.0 g/dL Final   RDW 10/04/2021 16.0 (H)  11.5 - 15.5 % Final   Platelets 10/04/2021 372  150 - 400 K/uL Final   nRBC 10/04/2021 0.0  0.0 - 0.2 % Final   Neutrophils Relative % 10/04/2021 68  % Final   Neutro Abs 10/04/2021 5.7  1.7 - 7.7 K/uL Final   Lymphocytes Relative 10/04/2021 22  % Final   Lymphs Abs 10/04/2021 1.8  0.7 - 4.0 K/uL Final   Monocytes Relative  10/04/2021 8  % Final   Monocytes Absolute 10/04/2021 0.7  0.1 - 1.0 K/uL Final   Eosinophils Relative 10/04/2021 1  % Final   Eosinophils Absolute 10/04/2021 0.1  0.0 - 0.5 K/uL Final   Basophils Relative 10/04/2021 1  % Final   Basophils Absolute 10/04/2021 0.1  0.0 - 0.1 K/uL Final   Immature Granulocytes 10/04/2021 0  % Final   Abs Immature Granulocytes 10/04/2021 0.03  0.00 - 0.07 K/uL Final   Performed at [facility], [place] [address] [place], [place] [address]   Sodium 10/04/2021 136  135 - 145 mmol/L Final   Potassium 10/04/2021 4.2  3.5 - 5.1 mmol/L Final   Chloride 10/04/2021 104  98 - 111 mmol/L Final   CO2 10/04/2021 25  22 - 32 mmol/L Final   Glucose, Bld 10/04/2021 100 (H)  70 - 99 mg/dL Final   Glucose reference range applies only to samples taken after fasting for at least 8 hours.   BUN 10/04/2021 9  6 - 20 mg/dL Final   Creatinine, Ser 10/04/2021 0.52  0.44 - 1.00 mg/dL Final   Calcium 10/04/2021 9.0  8.9 - 10.3 mg/dL Final   Total Protein 10/04/2021 7.0  6.5 - 8.1 g/dL Final   Albumin 10/04/2021 3.2 (L)  3.5 - 5.0 g/dL Final   AST 10/04/2021 13 (L)  15 - 41 U/L Final   ALT 10/04/2021 10  0 - 44 U/L Final   Alkaline Phosphatase 10/04/2021 61  38 - 126 U/L Final   Total Bilirubin 10/04/2021 0.3  0.3 - 1.2 mg/dL Final   GFR, Estimated 10/04/2021 >60  >60 mL/min Final   Comment: (NOTE) Calculated using the CKD-EPI Creatinine Equation (2021)    Anion gap 10/04/2021 7  5 - 15 Final   Performed at [place] [address] [place], [place] [address]   Hgb A1c MFr Bld 10/04/2021 7.0 (H)  4.8 - 5.6 % Final   Comment: (NOTE) Pre diabetes:          5.7%-6.4%  Diabetes:              >6.4%  Glycemic control for   <7.0% adults with diabetes    Mean Plasma Glucose 10/04/2021 154.2  mg/dL Final   Performed at [facility], [place] [address] [place], [place] [address]   Cholesterol 10/04/2021 178  0 - 200 mg/dL Final   Triglycerides 10/04/2021 155 (H)   <150 mg/dL Final   HDL 10/04/2021 52  >40 mg/dL Final   Total CHOL/HDL Ratio 10/04/2021 3.4  RATIO Final   VLDL 10/04/2021 31  0 - 40 mg/dL Final   LDL Cholesterol 10/04/2021 95  0 - 99 mg/dL Final   Comment:        Total Cholesterol/HDL:CHD Risk Coronary  Heart Disease Risk Table                     Men   Women  1/2 Average Risk   3.4   3.3  Average Risk       5.0   4.4  2 X Average Risk   9.6   7.1  3 X Average Risk  23.4   11.0        Use the calculated Patient Ratio above and the CHD Risk Table to determine the patient's CHD Risk.        ATP III CLASSIFICATION (LDL):  <100     mg/dL   Optimal  100-129  mg/dL   Near or Above                    Optimal  130-159  mg/dL   Borderline  160-189  mg/dL   High  >190     mg/dL   Very High Performed at [facility] [address], [place], [place] [address]    POC Amphetamine UR 10/04/2021 None Detected  NONE DETECTED (Cut Off Level 1000 ng/mL) Final   POC Secobarbital (BAR) 10/04/2021 None Detected  NONE DETECTED (Cut Off Level 300 ng/mL) Final   POC Buprenorphine (BUP) 10/04/2021 None Detected  NONE DETECTED (Cut Off Level 10 ng/mL) Final   POC Oxazepam (BZO) 10/04/2021 None Detected  NONE DETECTED (Cut Off Level 300 ng/mL) Final   POC Cocaine UR 10/04/2021 None Detected  NONE DETECTED (Cut Off Level 300 ng/mL) Final   POC Methamphetamine UR 10/04/2021 None Detected  NONE DETECTED (Cut Off Level 1000 ng/mL) Final   POC Morphine 10/04/2021 None Detected  NONE DETECTED (Cut Off Level 300 ng/mL) Final   POC Methadone UR 10/04/2021 None Detected  NONE DETECTED (Cut Off Level 300 ng/mL) Final   POC Oxycodone UR 10/04/2021 Positive (A)  NONE DETECTED (Cut Off Level 100 ng/mL) Final   POC Marijuana UR 10/04/2021 None Detected  NONE DETECTED (Cut Off Level 50 ng/mL) Final   SARSCOV2ONAVIRUS 2 AG 10/04/2021 NEGATIVE  NEGATIVE Final   Comment: (NOTE) SARS-CoV-2 antigen NOT DETECTED.   Negative results are presumptive.  Negative  results do not preclude SARS-CoV-2 infection and should not be used as the sole basis for treatment or other patient management decisions, including infection  control decisions, particularly in the presence of clinical signs and  symptoms consistent with COVID-19, or in those who have been in contact with the virus.  Negative results must be combined with clinical observations, patient history, and epidemiological information. The expected result is Negative.  Fact Sheet for Patients: HandmadeRecipes.com.cy  Fact Sheet for Healthcare Providers: FuneralLife.at  This test is not yet approved or cleared by the [place] FDA and  has been authorized for detection and/or diagnosis of SARS-CoV-2 by FDA under an Emergency Use Authorization (EUA).  This EUA will remain in effect (meaning this test can be used) for the duration of  the COV                          ID-19 declaration under Section 564(b)(1) of the Act, 21 U.S.C. section 360bbb-3(b)(1), unless the authorization is terminated or revoked sooner.     Valproic Acid Lvl 10/04/2021 51  50.0 - 100.0 ug/mL Final   Performed at  [address] [place], [place] [address]   Valproic Acid Lvl 10/08/2021 57  50.0 - 100.0 ug/mL Final   Performed at [facility], [place] [address] [place], [place] [address]   Glucose-Capillary 10/09/2021 123 (H)  70 - 99 mg/dL Final   Glucose reference range applies only to samples taken after fasting for at least 8 hours.  Admission on 09/09/2021, Discharged on 09/10/2021  Component Date Value Ref Range Status   Sodium 09/09/2021 134 (L)  135 - 145 mmol/L Final   Potassium 09/09/2021 4.3  3.5 - 5.1 mmol/L Final   Chloride 09/09/2021 99  98 - 111 mmol/L Final   CO2 09/09/2021 25  22 - 32 mmol/L Final   Glucose, Bld 09/09/2021 107 (H)  70 - 99 mg/dL Final   Glucose reference range applies only to samples taken after fasting for at  least 8 hours.   BUN 09/09/2021 12  6 - 20 mg/dL Final   Creatinine, Ser 09/09/2021 0.74  0.44 - 1.00 mg/dL Final   Calcium 09/09/2021 9.0  8.9 - 10.3 mg/dL Final   Total Protein 09/09/2021 7.4  6.5 - 8.1 g/dL Final   Albumin 09/09/2021 3.1 (L)  3.5 - 5.0 g/dL Final   AST 09/09/2021 13 (L)  15 - 41 U/L Final   ALT 09/09/2021 13  0 - 44 U/L Final   Alkaline Phosphatase 09/09/2021 66  38 - 126 U/L Final   Total Bilirubin 09/09/2021 0.2 (L)  0.3 - 1.2 mg/dL Final   GFR, Estimated 09/09/2021 >60  >60 mL/min Final   Comment: (NOTE) Calculated using the CKD-EPI Creatinine Equation (2021)    Anion gap 09/09/2021 10  5 - 15 Final   Performed at [facility], [place] [address], [place], [place] [address]   WBC 09/09/2021 8.5  4.0 - 10.5 K/uL Final   RBC 09/09/2021 4.53  3.87 - 5.11 MIL/uL Final   Hemoglobin 09/09/2021 11.8 (L)  12.0 - 15.0 g/dL Final   HCT 09/09/2021 38.0  36.0 - 46.0 % Final   MCV 09/09/2021 83.9  80.0 - 100.0 fL Final   MCH 09/09/2021 26.0  26.0 - 34.0 pg Final   MCHC 09/09/2021 31.1  30.0 - 36.0 g/dL Final   RDW 09/09/2021 17.8 (H)  11.5 - 15.5 % Final   Platelets 09/09/2021 442 (H)  150 - 400 K/uL Final   nRBC 09/09/2021 0.0  0.0 - 0.2 % Final   Neutrophils Relative % 09/09/2021 70  % Final   Neutro Abs 09/09/2021 5.9  1.7 - 7.7 K/uL Final   Lymphocytes Relative 09/09/2021 20  % Final   Lymphs Abs 09/09/2021 1.7  0.7 - 4.0 K/uL Final   Monocytes Relative 09/09/2021 8  % Final   Monocytes Absolute 09/09/2021 0.7  0.1 - 1.0 K/uL Final   Eosinophils Relative 09/09/2021 1  % Final   Eosinophils Absolute 09/09/2021 0.1  0.0 - 0.5 K/uL Final   Basophils Relative 09/09/2021 1  % Final   Basophils Absolute 09/09/2021 0.1  0.0 - 0.1 K/uL Final   Immature Granulocytes 09/09/2021 0  % Final   Abs Immature Granulocytes 09/09/2021 0.03  0.00 - 0.07 K/uL Final   Performed at [facility], [place] [address] [place], [place] [address]   Ammonia 09/09/2021 37 (H)  9 - 35  umol/L Final   Comment: HEMOLYSIS AT THIS LEVEL MAY AFFECT RESULT Performed at [facility], [place] [address], [place], [place] [address]    Opiates 09/09/2021 NONE DETECTED  NONE DETECTED Final   Cocaine 09/09/2021  NONE DETECTED  NONE DETECTED Final   Benzodiazepines 09/09/2021 NONE DETECTED  NONE DETECTED Final   Amphetamines 09/09/2021 NONE DETECTED  NONE DETECTED Final   Tetrahydrocannabinol 09/09/2021 NONE DETECTED  NONE DETECTED Final   Barbiturates 09/09/2021 NONE DETECTED  NONE DETECTED Final   Comment: (NOTE) DRUG SCREEN FOR MEDICAL PURPOSES ONLY.  IF CONFIRMATION IS NEEDED FOR ANY PURPOSE, NOTIFY LAB WITHIN 5 DAYS.  LOWEST DETECTABLE LIMITS FOR URINE DRUG SCREEN Drug Class                     Cutoff (ng/mL) Amphetamine and metabolites    1000 Barbiturate and metabolites    200 Benzodiazepine                 161 Tricyclics and metabolites     300 Opiates and metabolites        300 Cocaine and metabolites        300 THC                            50 Performed at [facility], [place] [address] [place], [place] [address]    Alcohol, Ethyl (B) 09/09/2021 <10  <10 mg/dL Final   Comment: (NOTE) Lowest detectable limit for serum alcohol is 10 mg/dL.  For medical purposes only. Performed at [facility], [place] [address] [place], [place] [address]    Lipase 09/09/2021 33  11 - 51 U/L Final   Performed at [facility] [address] [place], [place] [address]   Color, Urine 09/09/2021 COLORLESS (A)  YELLOW Final   APPearance 09/09/2021 CLEAR  CLEAR Final   Specific Gravity, Urine 09/09/2021 1.002 (L)  1.005 - 1.030 Final   pH 09/09/2021 6.0  5.0 - 8.0 Final   Glucose, UA 09/09/2021 NEGATIVE  NEGATIVE mg/dL Final   Hgb urine dipstick 09/09/2021 NEGATIVE  NEGATIVE Final   Bilirubin Urine 09/09/2021 NEGATIVE  NEGATIVE Final   Ketones, ur 09/09/2021 NEGATIVE  NEGATIVE mg/dL Final   Protein, ur 09/09/2021 NEGATIVE  NEGATIVE mg/dL Final   Nitrite  09/09/2021 NEGATIVE  NEGATIVE Final   Leukocytes,Ua 09/09/2021 NEGATIVE  NEGATIVE Final   Performed at [facility] [address], [place], [place] [address]   Specimen Description 09/09/2021 URINE, CLEAN CATCH   Final   Special Requests 09/09/2021 NONE   Final   Culture 09/09/2021  (A)   Final                   Value:<10,000 COLONIES/mL INSIGNIFICANT GROWTH Performed at [facility], [place] [address], [place], [place] [address]    Report Status 09/09/2021 09/10/2021 FINAL   Final   D-Dimer, Quant 09/09/2021 0.92 (H)  0.00 - 0.50 ug/mL-FEU Final   Comment: (NOTE) At the manufacturer cut-off value of 0.5 g/mL FEU, this assay has a negative predictive value of 95-100%.This assay is intended for use in conjunction with a clinical pretest probability (PTP) assessment model to exclude pulmonary embolism (PE) and deep venous thrombosis (DVT) in outpatients suspected of PE or DVT. Results should be correlated with clinical presentation. Performed at [facility], [place] [address], [place], [place] [address]    Troponin I (High Sensitivity) 09/09/2021 5  <18 ng/L Final   Comment: (NOTE) Elevated high sensitivity troponin I (hsTnI) values and significant  changes across serial measurements may suggest ACS but many other  chronic and acute  conditions are known to elevate hsTnI results.  Refer to the "Links" section for chest pain algorithms and additional  guidance. Performed at [facility], [place] [address] [place], [place] [address]    BP 09/10/2021 135/83  mmHg Final   S' Lateral 09/10/2021 2.30  cm Final   Area-P 1/2 09/10/2021 5.58  cm2 Final    Blood Alcohol level:  Lab Results  Component Value Date   ETH <10 11/23/2021   ETH <10 79/15/0569    Metabolic Disorder Labs: Lab Results  Component Value Date   HGBA1C 6.7 (H) 12/28/2021   MPG 145.59 12/28/2021   MPG 154.2 10/04/2021   Lab Results  Component Value Date   PROLACTIN 3.8 (L) 11/23/2021    Lab Results  Component Value Date   CHOL 171 11/23/2021   TRIG 125 11/23/2021   HDL 65 11/23/2021   CHOLHDL 2.6 11/23/2021   VLDL 25 11/23/2021   LDLCALC 81 11/23/2021   LDLCALC 95 10/04/2021    Therapeutic Lab Levels: No results found for: "LITHIUM" Lab Results  Component Value Date   VALPROATE 33 (L) 12/28/2021   VALPROATE 54 12/03/2021   No results found for: "CBMZ"  Physical Findings   PHQ2-9    Flowsheet Row ED from 11/23/2021 in [facility]  PHQ-2 Total Score 2  PHQ-9 Total Score 3      Flowsheet Row ED from 11/23/2021 in [facility] Most recent reading at 11/23/2021  6:44 PM ED from 11/22/2021 in [place] [facility] Most recent reading at 11/22/2021  8:28 PM ED from 11/22/2021 in [facility] Most recent reading at 11/22/2021  1:24 AM  C-SSRS RISK CATEGORY No Risk No Risk No Risk        Musculoskeletal  Strength & Muscle Tone: within normal limits Gait & Station: normal Patient leans: N/A  Psychiatric Specialty Exam  Presentation  General Appearance:  Disheveled  Eye Contact: Fair  Speech: Clear and Coherent; Normal Rate  Speech Volume: Normal  Handedness: Left   Mood and Affect  Mood: Euthymic  Affect: Constricted   Thought Process  Thought Processes: Coherent; Goal Directed; Linear  Descriptions of Associations:Intact  Orientation:Full (Time, Place and Person)  Thought Content:WDL  Diagnosis of Schizophrenia or Schizoaffective disorder in past: Yes  Duration of Psychotic Symptoms: NA  Hallucinations:Denied AVH Ideas of Reference:None  Suicidal Thoughts:Denied SI Homicidal Thoughts:Denied HI  Sensorium  Memory: Immediate Good  Judgment: Fair  Insight: Fair   Community education officer  Concentration: Good  Attention Span: Good  Recall: Good  Fund of  Knowledge: Good  Language: Good   Psychomotor Activity  Psychomotor Activity:Normal. AIMS 0, 0  Assets  Assets: Communication Skills; Desire for Improvement; Financial Resources/Insurance; Resilience   Sleep  Sleep:Good   Physical Exam  Physical Exam Vitals and nursing note reviewed.  Constitutional:      General: She is not in acute distress.    Appearance: She is not ill-appearing or diaphoretic.  HENT:     Head: Normocephalic and atraumatic.     Mouth/Throat:     Mouth: Mucous membranes are moist.  Eyes:     General: No visual field deficit.    Extraocular Movements: Extraocular movements intact.  Cardiovascular:     Rate and Rhythm: Normal rate and regular rhythm.     Heart sounds: Normal heart sounds.  Pulmonary:     Effort: Pulmonary effort is normal. No respiratory distress.  Breath sounds: No wheezing or rales.  Musculoskeletal:        General: No swelling or deformity.     Right lower leg: No edema.     Left lower leg: No edema.  Neurological:     General: No focal deficit present.     Mental Status: She is oriented to person, place, and time and easily aroused. She is lethargic.     Cranial Nerves: Cranial nerves 2-12 are intact. No dysarthria or facial asymmetry.     Sensory: Sensation is intact.     Motor: Motor function is intact. No weakness, tremor, abnormal muscle tone or pronator drift.     Coordination: Coordination is intact.     Gait: Gait is intact.     Comments: A&O to city and state, self, month, day, year. Fine finger movement intact - thumb to each finger.      Blood pressure 128/88, pulse 92, temperature 98.1 F (36.7 C), temperature source Oral, resp. rate 16, SpO2 97 %. There is no height or weight on file to calculate BMI.  Treatment Plan Summary: [place] remains cleared by psychiatry.   [facility] team along with [facility] health care manager and case manager continue to seek placement for [place].   Dx: SCZ, borderline  intellectual functioning, DM (insulin dependent), HLD, COPD, glaucoma, tobacco use d/o  Daytime somnolence A&Ox4, no focal neuro deficits, cmp wnl, ast/alt wnl, vpa level not toxic - subtherapeutic. UA positive for ketones only, no evidence of UTI. TSH wnl, CBGs ranges from 101-204. Afebrile, normotensive.  CBC pending Plan to continuing to monitor CBGs  SCZ on Clozaril Appreciate pharmacy for assistance with clozapine labs -  weekly ANC monitoring   Continue home meds per below. apixaban, 5 mg, BID clozapine, 50 mg, Daily cloZAPine, 75 mg, QHS diltiazem, 240 mg, Daily divalproex, 500 mg, BID fluticasone furoate-vilanterol, 1 puff, Daily insulin aspart, 0-9 Units, TID WC insulin glargine-yfgn, 10 Units, Daily latanoprost, 1 drop, QHS menthol-cetylpyridinium, 1 lozenge, Once metFORMIN, 1,000 mg, BID WC multivitamins with iron, 1 tablet, Daily nicotine, 7 mg, Daily polyethylene glycol, 17 g, Daily Vitamin D (Ergocalciferol), 50,000 Units, Q7 days    Dispo: Tentative Date: TBA Location: TBA Barrier: Pending accepting facility with nursing capabilities to assist patient with administering insulin  Signed: [name], DO Psychiatry Resident, PGY-2 [facility]  12/29/2021, 11:09 AM

## 2021-12-29 NOTE — ED Notes (Signed)
Offsite Distribution called to collect specimens and to deliver to [facility].

## 2021-12-29 NOTE — ED Notes (Signed)
Snack given.

## 2021-12-30 ENCOUNTER — Encounter (HOSPITAL_COMMUNITY): Payer: Self-pay | Admitting: Registered Nurse

## 2021-12-30 DIAGNOSIS — F209 Schizophrenia, unspecified: Secondary | ICD-10-CM | POA: Diagnosis not present

## 2021-12-30 LAB — GLUCOSE, CAPILLARY
Glucose-Capillary: 100 mg/dL — ABNORMAL HIGH (ref 70–99)
Glucose-Capillary: 137 mg/dL — ABNORMAL HIGH (ref 70–99)
Glucose-Capillary: 139 mg/dL — ABNORMAL HIGH (ref 70–99)

## 2021-12-30 NOTE — ED Notes (Signed)
Pt sleeping at present, no distress noted.  Monitoring for safety. 

## 2021-12-30 NOTE — ED Provider Notes (Signed)
HPI:  Teresa Mur[facility] 61 year old female with a past psychiatric history of schizophrenia, aggressive behavior, and possible intellectual disability presented to [facility] on 11/23/21 voluntarily as a walk in via [facility] with complaints that she was locked out of the group home.  Reports that owner of group home called police but doesn't know why.  Group homeowner [name] at [phone] reports patient only living there temporarily while DSS look for another place to live.  Reports patient has already been discharged from facility. Reported unwilling to take back unless patient has a guardian related to patient leaving, staying gone for days and missing doses of medications.  Reports that patient is unable to care for herself, needs reminders to shower and complete ADL, also needs assistance with medication management.    [name], 61 yr., female patient seen face to face by this provider, consulted with Dr. Hampton Abbot; and chart reviewed on 12/07/21.  On evaluation Teresa Murillo reports she feels okay.  She continues to deny suicidal/self-harm/homicidal ideation, psychosis, and paranoia.  She reports she is eating and sleeping without difficulty.  States she is tolerating her medications with no adverse reactions.  She is interacting appropriately with staff and peers.   During evaluation Teresa Murillo is lying in bed with no noted distress.  She is alert, oriented x 4, calm, cooperative and attentive.  /Her mood is euthymic with congruent affect.  She has normal speech, and behavior.  Objectively there is no evidence of psychosis/mania or delusional thinking.  Patient is able to converse coherently, goal directed thoughts, no distractibility, or pre-occupation.  She continues to deny suicidal/self-harm/homicidal ideation, psychosis, and paranoia.  There have been no unsafe behaviors or behavioral outburst or behaviors that are a danger to self or others.      Disposition:  Patient has  been psychiatrically cleared and does not meet criteria for inpatient psychiatric treatment.  She is unable to return to group home unless she has been assigned a guardian. She is unable to care for herself and has been boarding on the continuous assessment unit while awaiting DSS caseworker to find appropriate placement. Social work/TOC working with DSS to find placement.  [name], NP

## 2021-12-30 NOTE — ED Notes (Signed)
Pt given a snack.  Is awake and sitting up in the bed. No distress noted.  

## 2021-12-31 DIAGNOSIS — F209 Schizophrenia, unspecified: Secondary | ICD-10-CM | POA: Diagnosis not present

## 2021-12-31 LAB — GLUCOSE, CAPILLARY
Glucose-Capillary: 110 mg/dL — ABNORMAL HIGH (ref 70–99)
Glucose-Capillary: 151 mg/dL — ABNORMAL HIGH (ref 70–99)
Glucose-Capillary: 191 mg/dL — ABNORMAL HIGH (ref 70–99)

## 2021-12-31 NOTE — ED Provider Notes (Signed)
Behavioral Health Progress Note  Date and Time: 12/31/2021 10:10 AM Name: Teresa Murillo MRN:  [id number]   HPI:  Per chart review, Teresa Murillo 61 year old female with a past psychiatric history of schizophrenia, aggressive behavior, and possible intellectual disability presented to [facility] on 11/23/21 voluntarily as a walk in via [facility] with complaints that she was locked out of the group home. Reports that owner of group home called police but doesn't know why.  Group homeowner [name] at [phone] reports patient only living there temporarily while DSS look for another place to live.  Reports patient has already been discharged from facility. Reported unwilling to take back unless patient has a guardian related to patient leaving, staying gone for days and missing doses of medications.  Reports that patient is unable to care for herself, needs reminders to shower and complete ADL, also needs assistance with medication management   Subjective:  "When am I leaving?"  Patient seen and evaluated face-to-face by this provider, chart reviewed and case discussed with Dr. Dwyane Dee. \On evaluation, patient is alert and oriented x 3. She does not appear to be in acute distress. Her thought process is linear and speech is clear and coherent at a moderate tone. Her mood is euthymic and affect is congruent. She denies suicidal ideations. She denies homicidal ideations. She denies auditory or visual hallucinations.  There is no objective evidence that the patient is currently responding to internal or external stimuli. She denies depressive symptoms. She reports a fair appetite. She reports fair sleep. She is medication compliant. She denies medication side effects. She denies physical complaints. I discussed with the patient that the clinical social worker continues to seek appropriate placement.    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 On the Wechsler Adult Intelligence Scale-4, Ms. Roebuck achieved a full-scale IQ score of 73 and a percentile rank of 4 placing her in the borderline range of intellectual functioning.    Chronic obstructive pulmonary disease (COPD) (HCC)    Glaucoma    Hyperlipidemia    Hypertension    Iron deficiency    Schizoaffective disorder (HCC)    Type 2 diabetes mellitus (HCC)     Past Surgical History:  Procedure Laterality Date   TUBAL LIGATION     Family History:  Family History  Problem Relation Age of Onset   Breast cancer Maternal Grandmother    Family Psychiatric  History: No history reported.  Social History:  Social History   Substance and Sexual Activity  Alcohol Use Yes     Social History   Substance and Sexual Activity  Drug Use Not Currently    Social History   Socioeconomic History   Marital status: Divorced    Spouse name: Not on file   Number of children: Not on file   Years of education: Not on file   Highest education level: Not on file  Occupational History   Not on file  Tobacco Use   Smoking status: Every Day    Packs/day: 1.00    Types: Cigars, Cigarettes   Smokeless tobacco: Current  Vaping Use   Vaping Use: Never  used  Substance and Sexual Activity   Alcohol use: Yes   Drug use: Not Currently   Sexual activity: Not Currently    Birth control/protection: Surgical  Other Topics Concern   Not on file  Social History Narrative   Not on file   Social Determinants of Health   Financial Resource Strain: Not on file  Food Insecurity: Not on file  Transportation Needs: Not on file  Physical  Activity: Not on file  Stress: Not on file  Social Connections: Not on file   SDOH:  SDOH Screenings   Depression (PHQ2-9): Low Risk  (11/23/2021)  Tobacco Use: High Risk (12/30/2021)   Additional Social History:    Pain Medications: See MAR Prescriptions: See MAR Over the Counter: See MAR History of alcohol / drug use?: No history of alcohol / drug abuse Longest period of sobriety (when/how long): N/A    Current Medications:  Current Facility-Administered Medications  Medication Dose Route Frequency Provider Last Rate Last Admin   acetaminophen (TYLENOL) tablet 650 mg  650 mg Oral Q6H PRN Rankin, Shuvon B, NP   650 mg at 12/25/21 2200   albuterol (VENTOLIN HFA) 108 (90 Base) MCG/ACT inhaler 2 puff  2 puff Inhalation Q6H PRN Rankin, Shuvon B, NP   2 puff at 12/09/21 2102   alum & mag hydroxide-simeth (MAALOX/MYLANTA) 200-200-20 MG/5ML suspension 30 mL  30 mL Oral Q4H PRN Rankin, Shuvon B, NP   30 mL at 12/19/21 2109   apixaban (ELIQUIS) tablet 5 mg  5 mg Oral BID Rankin, Shuvon B, NP   5 mg at 12/31/21 0946   cloZAPine (CLOZARIL) tablet 50 mg  50 mg Oral Daily [name], NP   50 mg at 12/31/21 0941   cloZAPine (CLOZARIL) tablet 75 mg  75 mg Oral Dorie Rank, NP   75 mg at 12/30/21 2106   diltiazem (CARDIZEM CD) 24 hr capsule 240 mg  240 mg Oral Daily Rankin, Shuvon B, NP   240 mg at 12/31/21 0942   divalproex (DEPAKOTE ER) 24 hr tablet 500 mg  500 mg Oral BID Rankin, Shuvon B, NP   500 mg at 12/31/21 0940   fluticasone furoate-vilanterol (BREO ELLIPTA) 200-25 MCG/ACT 1 puff  1 puff Inhalation Daily Rankin, Shuvon B, NP   1 puff at 12/31/21 0813   haloperidol (HALDOL) tablet 10 mg  10 mg Oral Q8H PRN Rankin, Shuvon B, NP       insulin aspart (novoLOG) injection 0-9 Units  0-9 Units Subcutaneous TID WC Tharon Aquas, NP   1 Units at 12/30/21 1622   insulin glargine-yfgn (SEMGLEE) injection 10 Units  10 Units Subcutaneous Daily [name], DO   10 Units at 12/31/21 0951    latanoprost (XALATAN) 0.005 % ophthalmic solution 1 drop  1 drop Both Eyes QHS Rankin, Shuvon B, NP   1 drop at 12/30/21 2107   magnesium hydroxide (MILK OF MAGNESIA) suspension 30 mL  30 mL Oral Daily PRN Rankin, Shuvon B, NP       menthol-cetylpyridinium (CEPACOL) lozenge 3 mg  1 lozenge Oral Once [name], NP       menthol-cetylpyridinium (CEPACOL) lozenge 3 mg  1 lozenge Oral PRN [name], NP   3 mg at 12/31/21 0017   metFORMIN (GLUCOPHAGE-XR) 24 hr tablet 1,000 mg  1,000 mg Oral BID WC [name], DO   1,000 mg at 12/31/21 0813   multivitamins with iron tablet 1 tablet  1 tablet Oral Daily [name], DO  1 tablet at 12/31/21 0941   nicotine (NICODERM CQ - dosed in mg/24 hr) patch 7 mg  7 mg Transdermal Daily Rankin, Shuvon B, NP   7 mg at 12/31/21 0940   polyethylene glycol (MIRALAX / GLYCOLAX) packet 17 g  17 g Oral Daily [name], DO   17 g at 12/31/21 0944   Vitamin D (Ergocalciferol) (DRISDOL) 1.25 MG (50000 UNIT) capsule 50,000 Units  50,000 Units Oral Q7 days Rankin, Shuvon B, NP   50,000 Units at 12/24/21 7408   Current Outpatient Medications  Medication Sig Dispense Refill   Accu-Chek Softclix Lancets lancets Use as directed up to four times daily 100 each 0   apixaban (ELIQUIS) 5 MG TABS tablet Take 1 tablet (5 mg total) by mouth 2 (two) times daily. 60 tablet 0   ARIPiprazole (ABILIFY) 10 MG tablet Take 1 tablet (10 mg total) by mouth daily. 30 tablet 0   Blood Glucose Monitoring Suppl (ACCU-CHEK GUIDE) w/Device KIT Use as directed up to four times daily 1 kit 0   budesonide-formoterol (SYMBICORT) 160-4.5 MCG/ACT inhaler Inhale 2 puffs into the lungs in the morning and at bedtime.     Cholecalciferol (VITAMIN D3) 1.25 MG (50000 UT) CAPS Take 50,000 Units by mouth every Thursday.     cloZAPine (CLOZARIL) 25 MG tablet Take 3 tablets (75 mg total) by mouth at bedtime. 90 tablet 0   clozapine (CLOZARIL) 50 MG tablet Take 1 tablet (50 mg total) by mouth daily.  30 tablet 0   diltiazem (CARDIZEM CD) 240 MG 24 hr capsule Take 1 capsule (240 mg total) by mouth daily. (Patient not taking: Reported on 11/24/2021) 30 capsule 0   divalproex (DEPAKOTE ER) 500 MG 24 hr tablet Take 1 tablet (500 mg total) by mouth 2 (two) times daily. 60 tablet 0   glucose blood test strip Use as directed up to four times daily 50 each 0   haloperidol (HALDOL) 10 MG tablet Take 1 tablet (10 mg total) by mouth 3 (three) times daily as needed for agitation (and psychotic symptoms).     INGREZZA 40 MG capsule Take 1 capsule (40 mg total) by mouth in the morning. 30 capsule 0   insulin aspart (NOVOLOG) 100 UNIT/ML FlexPen Before each meal 3 times a day, 140-199 - 2 units, 200-250 - 4 units, 251-299 - 6 units,  300-349 - 8 units,  350 or above 10 units. Insulin PEN if approved, provide syringes and needles if needed.Please switch to any approved short acting Insulin if needed. 15 mL 0   insulin glargine (LANTUS) 100 UNIT/ML Solostar Pen Inject 12 Units into the skin daily. 15 mL 0   Insulin Pen Needle 32G X 4 MM MISC Use 4 times a day with insulin, 1 month supply. 100 each 0   latanoprost (XALATAN) 0.005 % ophthalmic solution Place 1 drop into both eyes at bedtime.     metFORMIN (GLUCOPHAGE) 500 MG tablet Take 500 mg by mouth 2 (two) times daily with a meal.     nicotine (NICODERM CQ - DOSED IN MG/24 HR) 7 mg/24hr patch Place 1 patch (7 mg total) onto the skin daily. 28 patch 0   PROAIR HFA 108 (90 Base) MCG/ACT inhaler Inhale 2 puffs into the lungs every 6 (six) hours as needed for wheezing or shortness of breath.      Labs  Lab Results:  No results displayed because visit has over 200 results.    Admission on 11/22/2021, Discharged on 11/22/2021  Component Date Value Ref Range Status   Glucose-Capillary 11/22/2021 169 (H)  70 - 99 mg/dL Final   Glucose reference range applies only to samples taken after fasting for at least 8 hours.  No results displayed because visit has over  200 results.    Admission on 10/04/2021, Discharged on 10/22/2021  Component Date Value Ref Range Status   SARS Coronavirus 2 by RT PCR 10/04/2021 NEGATIVE  NEGATIVE Final   Comment: (NOTE) SARS-CoV-2 target nucleic acids are NOT DETECTED.  The SARS-CoV-2 RNA is generally detectable in upper respiratory specimens during the acute phase of infection. The lowest concentration of SARS-CoV-2 viral copies this assay can detect is 138 copies/mL. A negative result does not preclude SARS-Cov-2 infection and should not be used as the sole basis for treatment or other patient management decisions. A negative result may occur with  improper specimen collection/handling, submission of specimen other than nasopharyngeal swab, presence of viral mutation(s) within the areas targeted by this assay, and inadequate number of viral copies(<138 copies/mL). A negative result must be combined with clinical observations, patient history, and epidemiological information. The expected result is Negative.  Fact Sheet for Patients:  EntrepreneurPulse.com.au  Fact Sheet for Healthcare Providers:  IncredibleEmployment.be  This test is no                          t yet approved or cleared by the [place] FDA and  has been authorized for detection and/or diagnosis of SARS-CoV-2 by FDA under an Emergency Use Authorization (EUA). This EUA will remain  in effect (meaning this test can be used) for the duration of the COVID-19 declaration under Section 564(b)(1) of the Act, 21 U.S.C.section 360bbb-3(b)(1), unless the authorization is terminated  or revoked sooner.       Influenza A by PCR 10/04/2021 NEGATIVE  NEGATIVE Final   Influenza B by PCR 10/04/2021 NEGATIVE  NEGATIVE Final   Comment: (NOTE) The Xpert Xpress SARS-CoV-2/FLU/RSV plus assay is intended as an aid in the diagnosis of influenza from Nasopharyngeal swab specimens and should not be used as a sole basis  for treatment. Nasal washings and aspirates are unacceptable for Xpert Xpress SARS-CoV-2/FLU/RSV testing.  Fact Sheet for Patients: EntrepreneurPulse.com.au  Fact Sheet for Healthcare Providers: IncredibleEmployment.be  This test is not yet approved or cleared by the [place] FDA and has been authorized for detection and/or diagnosis of SARS-CoV-2 by FDA under an Emergency Use Authorization (EUA). This EUA will remain in effect (meaning this test can be used) for the duration of the COVID-19 declaration under Section 564(b)(1) of the Act, 21 U.S.C. section 360bbb-3(b)(1), unless the authorization is terminated or revoked.  Performed at [facility], [place] [address], [place], [place] [address]    WBC 10/04/2021 8.4  4.0 - 10.5 K/uL Final   RBC 10/04/2021 4.42  3.87 - 5.11 MIL/uL Final   Hemoglobin 10/04/2021 11.6 (L)  12.0 - 15.0 g/dL Final   HCT 10/04/2021 35.7 (L)  36.0 - 46.0 % Final   MCV 10/04/2021 80.8  80.0 - 100.0 fL Final   MCH 10/04/2021 26.2  26.0 - 34.0 pg Final   MCHC 10/04/2021 32.5  30.0 - 36.0 g/dL Final   RDW 10/04/2021 16.0 (H)  11.5 - 15.5 % Final   Platelets 10/04/2021 372  150 - 400 K/uL Final   nRBC 10/04/2021 0.0  0.0 - 0.2 % Final   Neutrophils Relative % 10/04/2021 68  % Final  Neutro Abs 10/04/2021 5.7  1.7 - 7.7 K/uL Final   Lymphocytes Relative 10/04/2021 22  % Final   Lymphs Abs 10/04/2021 1.8  0.7 - 4.0 K/uL Final   Monocytes Relative 10/04/2021 8  % Final   Monocytes Absolute 10/04/2021 0.7  0.1 - 1.0 K/uL Final   Eosinophils Relative 10/04/2021 1  % Final   Eosinophils Absolute 10/04/2021 0.1  0.0 - 0.5 K/uL Final   Basophils Relative 10/04/2021 1  % Final   Basophils Absolute 10/04/2021 0.1  0.0 - 0.1 K/uL Final   Immature Granulocytes 10/04/2021 0  % Final   Abs Immature Granulocytes 10/04/2021 0.03  0.00 - 0.07 K/uL Final   Performed at [facility] [address], [place], [place]  [address]   Sodium 10/04/2021 136  135 - 145 mmol/L Final   Potassium 10/04/2021 4.2  3.5 - 5.1 mmol/L Final   Chloride 10/04/2021 104  98 - 111 mmol/L Final   CO2 10/04/2021 25  22 - 32 mmol/L Final   Glucose, Bld 10/04/2021 100 (H)  70 - 99 mg/dL Final   Glucose reference range applies only to samples taken after fasting for at least 8 hours.   BUN 10/04/2021 9  6 - 20 mg/dL Final   Creatinine, Ser 10/04/2021 0.52  0.44 - 1.00 mg/dL Final   Calcium 10/04/2021 9.0  8.9 - 10.3 mg/dL Final   Total Protein 10/04/2021 7.0  6.5 - 8.1 g/dL Final   Albumin 10/04/2021 3.2 (L)  3.5 - 5.0 g/dL Final   AST 10/04/2021 13 (L)  15 - 41 U/L Final   ALT 10/04/2021 10  0 - 44 U/L Final   Alkaline Phosphatase 10/04/2021 61  38 - 126 U/L Final   Total Bilirubin 10/04/2021 0.3  0.3 - 1.2 mg/dL Final   GFR, Estimated 10/04/2021 >60  >60 mL/min Final   Comment: (NOTE) Calculated using the CKD-EPI Creatinine Equation (2021)    Anion gap 10/04/2021 7  5 - 15 Final   Performed at [facility] [address], [place], [place] [address]   Hgb A1c MFr Bld 10/04/2021 7.0 (H)  4.8 - 5.6 % Final   Comment: (NOTE) Pre diabetes:          5.7%-6.4%  Diabetes:              >6.4%  Glycemic control for   <7.0% adults with diabetes    Mean Plasma Glucose 10/04/2021 154.2  mg/dL Final   Performed at [facility], [place] [address], [place], [place] [address]   Cholesterol 10/04/2021 178  0 - 200 mg/dL Final   Triglycerides 10/04/2021 155 (H)  <150 mg/dL Final   HDL 10/04/2021 52  >40 mg/dL Final   Total CHOL/HDL Ratio 10/04/2021 3.4  RATIO Final   VLDL 10/04/2021 31  0 - 40 mg/dL Final   LDL Cholesterol 10/04/2021 95  0 - 99 mg/dL Final   Comment:        Total Cholesterol/HDL:CHD Risk Coronary Heart Disease Risk Table                     Men   Women  1/2 Average Risk   3.4   3.3  Average Risk       5.0   4.4  2 X Average Risk   9.6   7.1  3 X Average Risk  23.4   11.0        Use the  calculated  Patient Ratio above and the CHD Risk Table to determine the patient's CHD Risk.        ATP III CLASSIFICATION (LDL):  <100     mg/dL   Optimal  100-129  mg/dL   Near or Above                    Optimal  130-159  mg/dL   Borderline  160-189  mg/dL   High  >190     mg/dL   Very High Performed at [facility] [address] [place], [place] [address]    POC Amphetamine UR 10/04/2021 None Detected  NONE DETECTED (Cut Off Level 1000 ng/mL) Final   POC Secobarbital (BAR) 10/04/2021 None Detected  NONE DETECTED (Cut Off Level 300 ng/mL) Final   POC Buprenorphine (BUP) 10/04/2021 None Detected  NONE DETECTED (Cut Off Level 10 ng/mL) Final   POC Oxazepam (BZO) 10/04/2021 None Detected  NONE DETECTED (Cut Off Level 300 ng/mL) Final   POC Cocaine UR 10/04/2021 None Detected  NONE DETECTED (Cut Off Level 300 ng/mL) Final   POC Methamphetamine UR 10/04/2021 None Detected  NONE DETECTED (Cut Off Level 1000 ng/mL) Final   POC Morphine 10/04/2021 None Detected  NONE DETECTED (Cut Off Level 300 ng/mL) Final   POC Methadone UR 10/04/2021 None Detected  NONE DETECTED (Cut Off Level 300 ng/mL) Final   POC Oxycodone UR 10/04/2021 Positive (A)  NONE DETECTED (Cut Off Level 100 ng/mL) Final   POC Marijuana UR 10/04/2021 None Detected  NONE DETECTED (Cut Off Level 50 ng/mL) Final   SARSCOV2ONAVIRUS 2 AG 10/04/2021 NEGATIVE  NEGATIVE Final   Comment: (NOTE) SARS-CoV-2 antigen NOT DETECTED.   Negative results are presumptive.  Negative results do not preclude SARS-CoV-2 infection and should not be used as the sole basis for treatment or other patient management decisions, including infection  control decisions, particularly in the presence of clinical signs and  symptoms consistent with COVID-19, or in those who have been in contact with the virus.  Negative results must be combined with clinical observations, patient history, and epidemiological information. The expected result is Negative.  Fact  Sheet for Patients: HandmadeRecipes.com.cy  Fact Sheet for Healthcare Providers: FuneralLife.at  This test is not yet approved or cleared by the [place] FDA and  has been authorized for detection and/or diagnosis of SARS-CoV-2 by FDA under an Emergency Use Authorization (EUA).  This EUA will remain in effect (meaning this test can be used) for the duration of  the COV                          ID-19 declaration under Section 564(b)(1) of the Act, 21 U.S.C. section 360bbb-3(b)(1), unless the authorization is terminated or revoked sooner.     Valproic Acid Lvl 10/04/2021 51  50.0 - 100.0 ug/mL Final   Performed at [facility] [address], [place], [place] [address]   Valproic Acid Lvl 10/08/2021 57  50.0 - 100.0 ug/mL Final   Performed at [facility] [address] [place], [place] [address]   Glucose-Capillary 10/09/2021 123 (H)  70 - 99 mg/dL Final   Glucose reference range applies only to samples taken after fasting for at least 8 hours.  Admission on 09/09/2021, Discharged on 09/10/2021  Component Date Value Ref Range Status   Sodium 09/09/2021 134 (L)  135 - 145 mmol/L Final   Potassium 09/09/2021 4.3  3.5 - 5.1 mmol/L Final   Chloride 09/09/2021 99  98 - 111 mmol/L Final   CO2 09/09/2021 25  22 - 32 mmol/L Final   Glucose, Bld 09/09/2021 107 (H)  70 - 99 mg/dL Final   Glucose reference range applies only to samples taken after fasting for at least 8 hours.   BUN 09/09/2021 12  6 - 20 mg/dL Final   Creatinine, Ser 09/09/2021 0.74  0.44 - 1.00 mg/dL Final   Calcium 09/09/2021 9.0  8.9 - 10.3 mg/dL Final   Total Protein 09/09/2021 7.4  6.5 - 8.1 g/dL Final   Albumin 09/09/2021 3.1 (L)  3.5 - 5.0 g/dL Final   AST 09/09/2021 13 (L)  15 - 41 U/L Final   ALT 09/09/2021 13  0 - 44 U/L Final   Alkaline Phosphatase 09/09/2021 66  38 - 126 U/L Final   Total Bilirubin 09/09/2021 0.2 (L)  0.3 - 1.2 mg/dL Final   GFR,  Estimated 09/09/2021 >60  >60 mL/min Final   Comment: (NOTE) Calculated using the CKD-EPI Creatinine Equation (2021)    Anion gap 09/09/2021 10  5 - 15 Final   Performed at [facility], [place] [address], [place], [place] [address]   WBC 09/09/2021 8.5  4.0 - 10.5 K/uL Final   RBC 09/09/2021 4.53  3.87 - 5.11 MIL/uL Final   Hemoglobin 09/09/2021 11.8 (L)  12.0 - 15.0 g/dL Final   HCT 09/09/2021 38.0  36.0 - 46.0 % Final   MCV 09/09/2021 83.9  80.0 - 100.0 fL Final   MCH 09/09/2021 26.0  26.0 - 34.0 pg Final   MCHC 09/09/2021 31.1  30.0 - 36.0 g/dL Final   RDW 09/09/2021 17.8 (H)  11.5 - 15.5 % Final   Platelets 09/09/2021 442 (H)  150 - 400 K/uL Final   nRBC 09/09/2021 0.0  0.0 - 0.2 % Final   Neutrophils Relative % 09/09/2021 70  % Final   Neutro Abs 09/09/2021 5.9  1.7 - 7.7 K/uL Final   Lymphocytes Relative 09/09/2021 20  % Final   Lymphs Abs 09/09/2021 1.7  0.7 - 4.0 K/uL Final   Monocytes Relative 09/09/2021 8  % Final   Monocytes Absolute 09/09/2021 0.7  0.1 - 1.0 K/uL Final   Eosinophils Relative 09/09/2021 1  % Final   Eosinophils Absolute 09/09/2021 0.1  0.0 - 0.5 K/uL Final   Basophils Relative 09/09/2021 1  % Final   Basophils Absolute 09/09/2021 0.1  0.0 - 0.1 K/uL Final   Immature Granulocytes 09/09/2021 0  % Final   Abs Immature Granulocytes 09/09/2021 0.03  0.00 - 0.07 K/uL Final   Performed at [facility], [place] [address], [place], [place] [address]   Ammonia 09/09/2021 37 (H)  9 - 35 umol/L Final   Comment: HEMOLYSIS AT THIS LEVEL MAY AFFECT RESULT Performed at [facility], [place] [address], [place], [place] [address]    Opiates 09/09/2021 NONE DETECTED  NONE DETECTED Final   Cocaine 09/09/2021 NONE DETECTED  NONE DETECTED Final   Benzodiazepines 09/09/2021 NONE DETECTED  NONE DETECTED Final   Amphetamines 09/09/2021 NONE DETECTED  NONE DETECTED Final   Tetrahydrocannabinol 09/09/2021 NONE DETECTED  NONE DETECTED Final   Barbiturates 09/09/2021  NONE DETECTED  NONE DETECTED Final   Comment: (NOTE) DRUG SCREEN FOR MEDICAL PURPOSES ONLY.  IF CONFIRMATION IS NEEDED FOR ANY PURPOSE, NOTIFY LAB WITHIN 5 DAYS.  LOWEST DETECTABLE LIMITS FOR URINE DRUG SCREEN Drug Class  Cutoff (ng/mL) Amphetamine and metabolites    1000 Barbiturate and metabolites    200 Benzodiazepine                 638 Tricyclics and metabolites     300 Opiates and metabolites        300 Cocaine and metabolites        300 THC                            50 Performed at [facility], [place] [address] [place], [place] [address]    Alcohol, Ethyl (B) 09/09/2021 <10  <10 mg/dL Final   Comment: (NOTE) Lowest detectable limit for serum alcohol is 10 mg/dL.  For medical purposes only. Performed at [facility], [place] [address], [place], [place] [address]    Lipase 09/09/2021 33  11 - 51 U/L Final   Performed at [facility] [address] [place], [place] [address]   Color, Urine 09/09/2021 COLORLESS (A)  YELLOW Final   APPearance 09/09/2021 CLEAR  CLEAR Final   Specific Gravity, Urine 09/09/2021 1.002 (L)  1.005 - 1.030 Final   pH 09/09/2021 6.0  5.0 - 8.0 Final   Glucose, UA 09/09/2021 NEGATIVE  NEGATIVE mg/dL Final   Hgb urine dipstick 09/09/2021 NEGATIVE  NEGATIVE Final   Bilirubin Urine 09/09/2021 NEGATIVE  NEGATIVE Final   Ketones, ur 09/09/2021 NEGATIVE  NEGATIVE mg/dL Final   Protein, ur 09/09/2021 NEGATIVE  NEGATIVE mg/dL Final   Nitrite 09/09/2021 NEGATIVE  NEGATIVE Final   Leukocytes,Ua 09/09/2021 NEGATIVE  NEGATIVE Final   Performed at [facility] [address] [place], [place] [address]   Specimen Description 09/09/2021 URINE, CLEAN CATCH   Final   Special Requests 09/09/2021 NONE   Final   Culture 09/09/2021  (A)   Final                   Value:<10,000 COLONIES/mL INSIGNIFICANT GROWTH Performed at [facility], [place] [address], [place], [place] [address]    Report Status 09/09/2021  09/10/2021 FINAL   Final   D-Dimer, Quant 09/09/2021 0.92 (H)  0.00 - 0.50 ug/mL-FEU Final   Comment: (NOTE) At the manufacturer cut-off value of 0.5 g/mL FEU, this assay has a negative predictive value of 95-100%.This assay is intended for use in conjunction with a clinical pretest probability (PTP) assessment model to exclude pulmonary embolism (PE) and deep venous thrombosis (DVT) in outpatients suspected of PE or DVT. Results should be correlated with clinical presentation. Performed at [facility], [place] [address], [place], [place] [address]    Troponin I (High Sensitivity) 09/09/2021 5  <18 ng/L Final   Comment: (NOTE) Elevated high sensitivity troponin I (hsTnI) values and significant  changes across serial measurements may suggest ACS but many other  chronic and acute conditions are known to elevate hsTnI results.  Refer to the "Links" section for chest pain algorithms and additional  guidance. Performed at [facility], [place] [address], [place], [place] [address]    BP 09/10/2021 135/83  mmHg Final   S' Lateral 09/10/2021 2.30  cm Final   Area-P 1/2 09/10/2021 5.58  cm2 Final    Blood Alcohol level:  Lab Results  Component Value Date   ETH <10 11/23/2021   ETH <10 02/54/2706    Metabolic Disorder Labs: Lab Results  Component Value Date   HGBA1C  6.7 (H) 12/28/2021   MPG 145.59 12/28/2021   MPG 154.2 10/04/2021   Lab Results  Component Value Date   PROLACTIN 3.8 (L) 11/23/2021   Lab Results  Component Value Date   CHOL 171 11/23/2021   TRIG 125 11/23/2021   HDL 65 11/23/2021   CHOLHDL 2.6 11/23/2021   VLDL 25 11/23/2021   LDLCALC 81 11/23/2021   LDLCALC 95 10/04/2021    Therapeutic Lab Levels: No results found for: "LITHIUM" Lab Results  Component Value Date   VALPROATE 33 (L) 12/28/2021   VALPROATE 54 12/03/2021   No results found for: "CBMZ"  Physical Findings   PHQ2-9    [place] ED from 11/23/2021 in [facility]  PHQ-2 Total Score 2  PHQ-9 Total Score 3      Flowsheet Row ED from 11/23/2021 in [facility] Most recent reading at 11/23/2021  6:44 PM ED from 11/22/2021 in [place] [facility] Most recent reading at 11/22/2021  8:28 PM ED from 11/22/2021 in [place] [facility] Most recent reading at 11/22/2021  1:24 AM  C-SSRS RISK CATEGORY No Risk No Risk No Risk        Musculoskeletal  Strength & Muscle Tone: within normal limits Gait & Station: normal Patient leans: N/A  Psychiatric Specialty Exam  Presentation  General Appearance:  Appropriate for Environment  Eye Contact: Fair  Speech: Clear and Coherent  Speech Volume: Normal  Handedness: Right   Mood and Affect  Mood: Euthymic  Affect: Congruent   Thought Process  Thought Processes: Coherent  Descriptions of Associations:Intact  Orientation:Full (Time, Place and Person)  Thought Content:WDL  Diagnosis of Schizophrenia or Schizoaffective disorder in past: Yes  Duration of Psychotic Symptoms: No data recorded  Hallucinations:Hallucinations: None  Ideas of Reference:None  Suicidal Thoughts:Suicidal Thoughts: No  Homicidal Thoughts:Homicidal Thoughts: No   Sensorium  Memory: Immediate Fair; Recent Fair  Judgment: Intact  Insight: Present   Executive Functions  Concentration: Fair  Attention Span: Fair  Recall: [employer] of Knowledge: Fair  Language: Fair   Psychomotor Activity  Psychomotor Activity: Psychomotor Activity: Normal   Assets  Assets: Communication Skills; Desire for Improvement   Sleep  Sleep: Sleep: Fair   Nutritional Assessment (For OBS and [facility]BC admissions only) Has the patient had a weight loss or gain of 10 pounds or more in the last 3 months?: No Has the patient had a decrease in food intake/or appetite?: No Does the patient have dental problems?:  No Does the patient have eating habits or behaviors that may be indicators of an eating disorder including binging or inducing vomiting?: No Has the patient recently lost weight without trying?: 0 Has the patient been eating poorly because of a decreased appetite?: 0 Malnutrition Screening Tool Score: 0    Physical Exam  Physical Exam HENT:     Head: Normocephalic.     Nose: Nose normal.  Eyes:     Conjunctiva/sclera: Conjunctivae normal.  Cardiovascular:     Rate and Rhythm: Normal rate.  Pulmonary:     Effort: Pulmonary effort is normal.  Musculoskeletal:        General: Normal range of motion.     Cervical back: Normal range of motion.  Neurological:     Mental Status: She is alert and oriented to person, place, and time.    Review of Systems  Constitutional: Negative.   HENT: Negative.    Eyes: Negative.   Respiratory:  Negative.    Cardiovascular: Negative.   Gastrointestinal: Negative.   Genitourinary: Negative.   Musculoskeletal: Negative.   Skin: Negative.   Neurological: Negative.   Endo/Heme/Allergies: Negative.    Blood pressure (!) 143/91, pulse 86, temperature 98 F (36.7 C), temperature source Oral, resp. rate 17, SpO2 99 %. There is no height or weight on file to calculate BMI.  Treatment Plan Summary:  Disposition:  Patient has been psychiatrically cleared and does not meet criteria for inpatient psychiatric treatment. She is unable to return to group home unless she has been assigned a guardian. She is unable to care for herself and has been boarding on the continuous assessment unit while awaiting DSS caseworker to find appropriate placement. Social work/TOC working with DSS to find placement. Per Boris Lown, LCSW, Per Kevin Fenton, [facility] social work supervisor, their attorney determined the patient has the capacity to make placement decisions and they will not be seeking guardianship.   Current medication regimen. No medication changes on 12/31/21.    apixaban  5 mg Oral BID   clozapine  50 mg Oral Daily   cloZAPine  75 mg Oral QHS   diltiazem  240 mg Oral Daily   divalproex  500 mg Oral BID   fluticasone furoate-vilanterol  1 puff Inhalation Daily   insulin aspart  0-9 Units Subcutaneous TID WC   insulin glargine-yfgn  10 Units Subcutaneous Daily   latanoprost  1 drop Both Eyes QHS   menthol-cetylpyridinium  1 lozenge Oral Once   metFORMIN  1,000 mg Oral BID WC   multivitamins with iron  1 tablet Oral Daily   nicotine  7 mg Transdermal Daily   polyethylene glycol  17 g Oral Daily   Vitamin D (Ergocalciferol)  50,000 Units Oral Q7 days   Labs reviewed:  CBG this am-110  Per Lawana Pai, [facility], Next [place] due on 11/7. Most recent [place] =[phone] on 10/31   Marissa Calamity, NP 12/31/2021 10:10 AM

## 2021-12-31 NOTE — Care Management (Signed)
Care Management   Per the patient Care Manager at [facility]) as of December 30, 2021 the patient has received an increase in her funding in order to be placed in a Group Home.   Per Marita [place], the previous Group Home facility ([facility]) is re-considering the patient for potential placement.

## 2021-12-31 NOTE — ED Notes (Signed)
Pt sleeping at present, no distress noted, monitoring for safety. 

## 2021-12-31 NOTE — Progress Notes (Signed)
Pt is asleep. Respirations are even and unlabored. No distress noted. Staff will monitor for pt's safety. 

## 2021-12-31 NOTE — ED Notes (Signed)
Pt is sleeping. No distress noted. Will continue to monitor safety. 

## 2021-12-31 NOTE — ED Notes (Signed)
Pt resting at present, no distress noted,  MOnitoring for safety.

## 2022-01-01 DIAGNOSIS — F209 Schizophrenia, unspecified: Secondary | ICD-10-CM | POA: Diagnosis not present

## 2022-01-01 LAB — GLUCOSE, CAPILLARY
Glucose-Capillary: 92 mg/dL (ref 70–99)
Glucose-Capillary: 95 mg/dL (ref 70–99)
Glucose-Capillary: 162 mg/dL — ABNORMAL HIGH (ref 70–99)

## 2022-01-01 NOTE — ED Notes (Signed)
Pt sleeping at present, no distress noted.  Monitoring for safety. 

## 2022-01-01 NOTE — ED Notes (Signed)
Patient taking a shower.

## 2022-01-01 NOTE — ED Provider Notes (Signed)
Behavioral Health Progress Note  Date and Time: 01/01/2022 10:46 AM Name: Teresa Murillo MRN:  [id number]   HPI: Per chart review, Teresa Murillo 61 year old female with a past psychiatric history of schizophrenia, aggressive behavior, and possible intellectual disability presented to [facility] on 11/23/21 voluntarily as a walk in via [facility] with complaints that she was locked out of the group home. Reports that owner of group home called police but doesn't know why.  Group homeowner [name] at [phone] reports patient only living there temporarily while DSS look for another place to live.  Reports patient has already been discharged from facility. Reported unwilling to take back unless patient has a guardian related to patient leaving, staying gone for days and missing doses of medications.  Reports that patient is unable to care for herself, needs reminders to shower and complete ADL, also needs assistance with medication management.   Subjective: Patient seen and evaluated face-to-face by this provider, chart reviewed and case discussed with Dr. Dwyane Dee. On evaluation, patient is alert and oriented x 4.  Her thought process is linear and speech is clear and coherent. Her mood is dysphoric and affect is congruent. She has fair eye contact. She is calm and cooperative. She denies suicidal ideations. She denies homicidal ideations. She denies auditory or visual hallucinations. There is no objective evidence that the patient is currently responding to internal or external stimuli. Today, she reports feeling a little sad and depressed because she has been in the hospital for a long time. She reports pretty good sleep, sleeping too much. She reports a good appetite. She is compliant with her medications and denie medication side effects at this time. She denies physical symptoms. She is requesting crayons and a coloring book.  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 On the Wechsler Adult Intelligence Scale-4, Ms. Ruud achieved a full-scale IQ score of 73 and a percentile rank of 4 placing her in the borderline range of intellectual functioning.    Chronic obstructive pulmonary disease (COPD) (HCC)    Glaucoma    Hyperlipidemia    Hypertension    Iron deficiency    Schizoaffective disorder (HCC)    Type 2 diabetes mellitus (HCC)     Past Surgical History:  Procedure Laterality Date   TUBAL LIGATION     Family History:  Family History  Problem Relation Age of Onset   Breast cancer Maternal Grandmother    Family Psychiatric  History: No history reported.  Social History:  Social History   Substance and Sexual Activity  Alcohol Use Yes     Social History   Substance and Sexual Activity  Drug Use Not Currently    Social History   Socioeconomic History   Marital status: Divorced    Spouse name: Not on file   Number of children: Not on file   Years of education: Not on file   Highest education level: Not on file  Occupational History   Not on file  Tobacco Use   Smoking status: Every Day    Packs/day: 1.00    Types: Cigars, Cigarettes   Smokeless tobacco: Current  Vaping Use   Vaping Use: Never used  Substance and Sexual Activity   Alcohol use: Yes   Drug use: Not Currently   Sexual activity: Not Currently    Birth control/protection: Surgical  Other Topics Concern   Not on file  Social History Narrative   Not on file   Social Determinants of Health   Financial Resource Strain: Not on file  Food Insecurity: Not on file   Transportation Needs: Not on file  Physical Activity: Not on file  Stress: Not on file  Social Connections: Not on file   SDOH:  SDOH Screenings   Depression (PHQ2-9): Low Risk  (11/23/2021)  Tobacco Use: High Risk (12/30/2021)    Current Medications:  Current Facility-Administered Medications  Medication Dose Route Frequency Provider Last Rate Last Admin   acetaminophen (TYLENOL) tablet 650 mg  650 mg Oral Q6H PRN Rankin, Shuvon B, NP   650 mg at 12/25/21 2200   albuterol (VENTOLIN HFA) 108 (90 Base) MCG/ACT inhaler 2 puff  2 puff Inhalation Q6H PRN Rankin, Shuvon B, NP   2 puff at 12/09/21 2102   alum & mag hydroxide-simeth (MAALOX/MYLANTA) 200-200-20 MG/5ML suspension 30 mL  30 mL Oral Q4H PRN Rankin, Shuvon B, NP   30 mL at 12/19/21 2109   apixaban (ELIQUIS) tablet 5 mg  5 mg Oral BID Rankin, Shuvon B, NP   5 mg at 01/01/22 0851   cloZAPine (CLOZARIL) tablet 50 mg  50 mg Oral Daily [name], NP   50 mg at 01/01/22 0850   cloZAPine (CLOZARIL) tablet 75 mg  75 mg Oral Dorie Rank, NP   75 mg at 12/31/21 2202   diltiazem (CARDIZEM CD) 24 hr capsule 240 mg  240 mg Oral Daily Rankin, Shuvon B, NP   240 mg at 01/01/22 0851   divalproex (DEPAKOTE ER) 24 hr tablet 500 mg  500 mg Oral BID Rankin, Shuvon B, NP   500 mg at 12/31/21 2202   fluticasone furoate-vilanterol (BREO ELLIPTA) 200-25 MCG/ACT 1 puff  1 puff Inhalation Daily Rankin, Shuvon B, NP   1 puff at 01/01/22 0850   haloperidol (HALDOL) tablet 10 mg  10 mg Oral Q8H PRN Rankin, Shuvon B, NP       insulin aspart (novoLOG) injection 0-9 Units  0-9 Units Subcutaneous TID WC Tharon Aquas, NP   2 Units at 12/31/21 1712   insulin glargine-yfgn (SEMGLEE) injection 10 Units  10 Units Subcutaneous Daily [name], DO   10 Units at 01/01/22 1039   latanoprost (XALATAN) 0.005 % ophthalmic solution 1 drop  1 drop Both Eyes QHS Rankin, Shuvon B, NP   1 drop at 12/31/21 2203   magnesium hydroxide (MILK OF MAGNESIA) suspension  30 mL  30 mL Oral Daily PRN Rankin, Shuvon B, NP       menthol-cetylpyridinium (CEPACOL) lozenge 3 mg  1 lozenge Oral Once [name], NP       menthol-cetylpyridinium (CEPACOL) lozenge 3 mg  1 lozenge Oral PRN [name], NP   3 mg at 12/31/21 0017   metFORMIN (GLUCOPHAGE-XR) 24 hr tablet 1,000 mg  1,000 mg Oral BID WC [name], DO   1,000 mg at 01/01/22 0850   multivitamins with iron tablet 1 tablet  1 tablet Oral Daily [name], DO   1 tablet at 01/01/22 0851   nicotine (NICODERM CQ - dosed in mg/24 hr) patch 7 mg  7 mg Transdermal Daily Rankin, Shuvon B, NP   7 mg  at 12/31/21 0940   polyethylene glycol (MIRALAX / GLYCOLAX) packet 17 g  17 g Oral Daily [name], DO   17 g at 01/01/22 1829   Vitamin D (Ergocalciferol) (DRISDOL) 1.25 MG (50000 UNIT) capsule 50,000 Units  50,000 Units Oral Q7 days Rankin, Shuvon B, NP   50,000 Units at 12/31/21 1019   Current Outpatient Medications  Medication Sig Dispense Refill   Accu-Chek Softclix Lancets lancets Use as directed up to four times daily 100 each 0   apixaban (ELIQUIS) 5 MG TABS tablet Take 1 tablet (5 mg total) by mouth 2 (two) times daily. 60 tablet 0   ARIPiprazole (ABILIFY) 10 MG tablet Take 1 tablet (10 mg total) by mouth daily. 30 tablet 0   Blood Glucose Monitoring Suppl (ACCU-CHEK GUIDE) w/Device KIT Use as directed up to four times daily 1 kit 0   budesonide-formoterol (SYMBICORT) 160-4.5 MCG/ACT inhaler Inhale 2 puffs into the lungs in the morning and at bedtime.     Cholecalciferol (VITAMIN D3) 1.25 MG (50000 UT) CAPS Take 50,000 Units by mouth every Thursday.     cloZAPine (CLOZARIL) 25 MG tablet Take 3 tablets (75 mg total) by mouth at bedtime. 90 tablet 0   clozapine (CLOZARIL) 50 MG tablet Take 1 tablet (50 mg total) by mouth daily. 30 tablet 0   diltiazem (CARDIZEM CD) 240 MG 24 hr capsule Take 1 capsule (240 mg total) by mouth daily. (Patient not taking: Reported on 11/24/2021) 30 capsule 0   divalproex  (DEPAKOTE ER) 500 MG 24 hr tablet Take 1 tablet (500 mg total) by mouth 2 (two) times daily. 60 tablet 0   glucose blood test strip Use as directed up to four times daily 50 each 0   haloperidol (HALDOL) 10 MG tablet Take 1 tablet (10 mg total) by mouth 3 (three) times daily as needed for agitation (and psychotic symptoms).     INGREZZA 40 MG capsule Take 1 capsule (40 mg total) by mouth in the morning. 30 capsule 0   insulin aspart (NOVOLOG) 100 UNIT/ML FlexPen Before each meal 3 times a day, 140-199 - 2 units, 200-250 - 4 units, 251-299 - 6 units,  300-349 - 8 units,  350 or above 10 units. Insulin PEN if approved, provide syringes and needles if needed.Please switch to any approved short acting Insulin if needed. 15 mL 0   insulin glargine (LANTUS) 100 UNIT/ML Solostar Pen Inject 12 Units into the skin daily. 15 mL 0   Insulin Pen Needle 32G X 4 MM MISC Use 4 times a day with insulin, 1 month supply. 100 each 0   latanoprost (XALATAN) 0.005 % ophthalmic solution Place 1 drop into both eyes at bedtime.     metFORMIN (GLUCOPHAGE) 500 MG tablet Take 500 mg by mouth 2 (two) times daily with a meal.     nicotine (NICODERM CQ - DOSED IN MG/24 HR) 7 mg/24hr patch Place 1 patch (7 mg total) onto the skin daily. 28 patch 0   PROAIR HFA 108 (90 Base) MCG/ACT inhaler Inhale 2 puffs into the lungs every 6 (six) hours as needed for wheezing or shortness of breath.      Labs  Lab Results:  No results displayed because visit has over 200 results.    Admission on 11/22/2021, Discharged on 11/22/2021  Component Date Value Ref Range Status   Glucose-Capillary 11/22/2021 169 (H)  70 - 99 mg/dL Final   Glucose reference range applies only to samples taken after fasting for  at least 8 hours.  No results displayed because visit has over 200 results.    Admission on 10/04/2021, Discharged on 10/22/2021  Component Date Value Ref Range Status   SARS Coronavirus 2 by RT PCR 10/04/2021 NEGATIVE  NEGATIVE Final    Comment: (NOTE) SARS-CoV-2 target nucleic acids are NOT DETECTED.  The SARS-CoV-2 RNA is generally detectable in upper respiratory specimens during the acute phase of infection. The lowest concentration of SARS-CoV-2 viral copies this assay can detect is 138 copies/mL. A negative result does not preclude SARS-Cov-2 infection and should not be used as the sole basis for treatment or other patient management decisions. A negative result may occur with  improper specimen collection/handling, submission of specimen other than nasopharyngeal swab, presence of viral mutation(s) within the areas targeted by this assay, and inadequate number of viral copies(<138 copies/mL). A negative result must be combined with clinical observations, patient history, and epidemiological information. The expected result is Negative.  Fact Sheet for Patients:  EntrepreneurPulse.com.au  Fact Sheet for Healthcare Providers:  IncredibleEmployment.be  This test is no                          t yet approved or cleared by the [place] FDA and  has been authorized for detection and/or diagnosis of SARS-CoV-2 by FDA under an Emergency Use Authorization (EUA). This EUA will remain  in effect (meaning this test can be used) for the duration of the COVID-19 declaration under Section 564(b)(1) of the Act, 21 U.S.C.section 360bbb-3(b)(1), unless the authorization is terminated  or revoked sooner.       Influenza A by PCR 10/04/2021 NEGATIVE  NEGATIVE Final   Influenza B by PCR 10/04/2021 NEGATIVE  NEGATIVE Final   Comment: (NOTE) The Xpert Xpress SARS-CoV-2/FLU/RSV plus assay is intended as an aid in the diagnosis of influenza from Nasopharyngeal swab specimens and should not be used as a sole basis for treatment. Nasal washings and aspirates are unacceptable for Xpert Xpress SARS-CoV-2/FLU/RSV testing.  Fact Sheet for  Patients: EntrepreneurPulse.com.au  Fact Sheet for Healthcare Providers: IncredibleEmployment.be  This test is not yet approved or cleared by the [place] FDA and has been authorized for detection and/or diagnosis of SARS-CoV-2 by FDA under an Emergency Use Authorization (EUA). This EUA will remain in effect (meaning this test can be used) for the duration of the COVID-19 declaration under Section 564(b)(1) of the Act, 21 U.S.C. section 360bbb-3(b)(1), unless the authorization is terminated or revoked.  Performed at [facility], [place] [address] [place], [place] [address]    WBC 10/04/2021 8.4  4.0 - 10.5 K/uL Final   RBC 10/04/2021 4.42  3.87 - 5.11 MIL/uL Final   Hemoglobin 10/04/2021 11.6 (L)  12.0 - 15.0 g/dL Final   HCT 10/04/2021 35.7 (L)  36.0 - 46.0 % Final   MCV 10/04/2021 80.8  80.0 - 100.0 fL Final   MCH 10/04/2021 26.2  26.0 - 34.0 pg Final   MCHC 10/04/2021 32.5  30.0 - 36.0 g/dL Final   RDW 10/04/2021 16.0 (H)  11.5 - 15.5 % Final   Platelets 10/04/2021 372  150 - 400 K/uL Final   nRBC 10/04/2021 0.0  0.0 - 0.2 % Final   Neutrophils Relative % 10/04/2021 68  % Final   Neutro Abs 10/04/2021 5.7  1.7 - 7.7 K/uL Final   Lymphocytes Relative 10/04/2021 22  % Final   Lymphs Abs 10/04/2021 1.8  0.7 - 4.0 K/uL  Final   Monocytes Relative 10/04/2021 8  % Final   Monocytes Absolute 10/04/2021 0.7  0.1 - 1.0 K/uL Final   Eosinophils Relative 10/04/2021 1  % Final   Eosinophils Absolute 10/04/2021 0.1  0.0 - 0.5 K/uL Final   Basophils Relative 10/04/2021 1  % Final   Basophils Absolute 10/04/2021 0.1  0.0 - 0.1 K/uL Final   Immature Granulocytes 10/04/2021 0  % Final   Abs Immature Granulocytes 10/04/2021 0.03  0.00 - 0.07 K/uL Final   Performed at [facility], [place] [address] [place], [place] [address]   Sodium 10/04/2021 136  135 - 145 mmol/L Final   Potassium 10/04/2021 4.2  3.5 - 5.1 mmol/L Final   Chloride  10/04/2021 104  98 - 111 mmol/L Final   CO2 10/04/2021 25  22 - 32 mmol/L Final   Glucose, Bld 10/04/2021 100 (H)  70 - 99 mg/dL Final   Glucose reference range applies only to samples taken after fasting for at least 8 hours.   BUN 10/04/2021 9  6 - 20 mg/dL Final   Creatinine, Ser 10/04/2021 0.52  0.44 - 1.00 mg/dL Final   Calcium 10/04/2021 9.0  8.9 - 10.3 mg/dL Final   Total Protein 10/04/2021 7.0  6.5 - 8.1 g/dL Final   Albumin 10/04/2021 3.2 (L)  3.5 - 5.0 g/dL Final   AST 10/04/2021 13 (L)  15 - 41 U/L Final   ALT 10/04/2021 10  0 - 44 U/L Final   Alkaline Phosphatase 10/04/2021 61  38 - 126 U/L Final   Total Bilirubin 10/04/2021 0.3  0.3 - 1.2 mg/dL Final   GFR, Estimated 10/04/2021 >60  >60 mL/min Final   Comment: (NOTE) Calculated using the CKD-EPI Creatinine Equation (2021)    Anion gap 10/04/2021 7  5 - 15 Final   Performed at [facility] [address] [place], [place] [address]   Hgb A1c MFr Bld 10/04/2021 7.0 (H)  4.8 - 5.6 % Final   Comment: (NOTE) Pre diabetes:          5.7%-6.4%  Diabetes:              >6.4%  Glycemic control for   <7.0% adults with diabetes    Mean Plasma Glucose 10/04/2021 154.2  mg/dL Final   Performed at [facility], [place] [address], [place], [place] [address]   Cholesterol 10/04/2021 178  0 - 200 mg/dL Final   Triglycerides 10/04/2021 155 (H)  <150 mg/dL Final   HDL 10/04/2021 52  >40 mg/dL Final   Total CHOL/HDL Ratio 10/04/2021 3.4  RATIO Final   VLDL 10/04/2021 31  0 - 40 mg/dL Final   LDL Cholesterol 10/04/2021 95  0 - 99 mg/dL Final   Comment:        Total Cholesterol/HDL:CHD Risk Coronary Heart Disease Risk Table                     Men   Women  1/2 Average Risk   3.4   3.3  Average Risk       5.0   4.4  2 X Average Risk   9.6   7.1  3 X Average Risk  23.4   11.0        Use the calculated Patient Ratio above and the CHD Risk Table to determine the patient's CHD Risk.        ATP III CLASSIFICATION (LDL):   <100  mg/dL   Optimal  100-129  mg/dL   Near or Above                    Optimal  130-159  mg/dL   Borderline  160-189  mg/dL   High  >190     mg/dL   Very High Performed at [facility] [address], [place], [place] [address]    POC Amphetamine UR 10/04/2021 None Detected  NONE DETECTED (Cut Off Level 1000 ng/mL) Final   POC Secobarbital (BAR) 10/04/2021 None Detected  NONE DETECTED (Cut Off Level 300 ng/mL) Final   POC Buprenorphine (BUP) 10/04/2021 None Detected  NONE DETECTED (Cut Off Level 10 ng/mL) Final   POC Oxazepam (BZO) 10/04/2021 None Detected  NONE DETECTED (Cut Off Level 300 ng/mL) Final   POC Cocaine UR 10/04/2021 None Detected  NONE DETECTED (Cut Off Level 300 ng/mL) Final   POC Methamphetamine UR 10/04/2021 None Detected  NONE DETECTED (Cut Off Level 1000 ng/mL) Final   POC Morphine 10/04/2021 None Detected  NONE DETECTED (Cut Off Level 300 ng/mL) Final   POC Methadone UR 10/04/2021 None Detected  NONE DETECTED (Cut Off Level 300 ng/mL) Final   POC Oxycodone UR 10/04/2021 Positive (A)  NONE DETECTED (Cut Off Level 100 ng/mL) Final   POC Marijuana UR 10/04/2021 None Detected  NONE DETECTED (Cut Off Level 50 ng/mL) Final   SARSCOV2ONAVIRUS 2 AG 10/04/2021 NEGATIVE  NEGATIVE Final   Comment: (NOTE) SARS-CoV-2 antigen NOT DETECTED.   Negative results are presumptive.  Negative results do not preclude SARS-CoV-2 infection and should not be used as the sole basis for treatment or other patient management decisions, including infection  control decisions, particularly in the presence of clinical signs and  symptoms consistent with COVID-19, or in those who have been in contact with the virus.  Negative results must be combined with clinical observations, patient history, and epidemiological information. The expected result is Negative.  Fact Sheet for Patients: HandmadeRecipes.com.cy  Fact Sheet for Healthcare  Providers: FuneralLife.at  This test is not yet approved or cleared by the [place] FDA and  has been authorized for detection and/or diagnosis of SARS-CoV-2 by FDA under an Emergency Use Authorization (EUA).  This EUA will remain in effect (meaning this test can be used) for the duration of  the COV                          ID-19 declaration under Section 564(b)(1) of the Act, 21 U.S.C. section 360bbb-3(b)(1), unless the authorization is terminated or revoked sooner.     Valproic Acid Lvl 10/04/2021 51  50.0 - 100.0 ug/mL Final   Performed at [facility] [address], [place], [place] [address]   Valproic Acid Lvl 10/08/2021 57  50.0 - 100.0 ug/mL Final   Performed at [facility] [address], [place], [place] [address]   Glucose-Capillary 10/09/2021 123 (H)  70 - 99 mg/dL Final   Glucose reference range applies only to samples taken after fasting for at least 8 hours.  Admission on 09/09/2021, Discharged on 09/10/2021  Component Date Value Ref Range Status   Sodium 09/09/2021 134 (L)  135 - 145 mmol/L Final   Potassium 09/09/2021 4.3  3.5 - 5.1 mmol/L Final   Chloride 09/09/2021 99  98 - 111 mmol/L Final   CO2 09/09/2021 25  22 - 32 mmol/L Final   Glucose, Bld 09/09/2021  107 (H)  70 - 99 mg/dL Final   Glucose reference range applies only to samples taken after fasting for at least 8 hours.   BUN 09/09/2021 12  6 - 20 mg/dL Final   Creatinine, Ser 09/09/2021 0.74  0.44 - 1.00 mg/dL Final   Calcium 09/09/2021 9.0  8.9 - 10.3 mg/dL Final   Total Protein 09/09/2021 7.4  6.5 - 8.1 g/dL Final   Albumin 09/09/2021 3.1 (L)  3.5 - 5.0 g/dL Final   AST 09/09/2021 13 (L)  15 - 41 U/L Final   ALT 09/09/2021 13  0 - 44 U/L Final   Alkaline Phosphatase 09/09/2021 66  38 - 126 U/L Final   Total Bilirubin 09/09/2021 0.2 (L)  0.3 - 1.2 mg/dL Final   GFR, Estimated 09/09/2021 >60  >60 mL/min Final   Comment: (NOTE) Calculated using the CKD-EPI  Creatinine Equation (2021)    Anion gap 09/09/2021 10  5 - 15 Final   Performed at [facility], [place] [address], [place], [place] [address]   WBC 09/09/2021 8.5  4.0 - 10.5 K/uL Final   RBC 09/09/2021 4.53  3.87 - 5.11 MIL/uL Final   Hemoglobin 09/09/2021 11.8 (L)  12.0 - 15.0 g/dL Final   HCT 09/09/2021 38.0  36.0 - 46.0 % Final   MCV 09/09/2021 83.9  80.0 - 100.0 fL Final   MCH 09/09/2021 26.0  26.0 - 34.0 pg Final   MCHC 09/09/2021 31.1  30.0 - 36.0 g/dL Final   RDW 09/09/2021 17.8 (H)  11.5 - 15.5 % Final   Platelets 09/09/2021 442 (H)  150 - 400 K/uL Final   nRBC 09/09/2021 0.0  0.0 - 0.2 % Final   Neutrophils Relative % 09/09/2021 70  % Final   Neutro Abs 09/09/2021 5.9  1.7 - 7.7 K/uL Final   Lymphocytes Relative 09/09/2021 20  % Final   Lymphs Abs 09/09/2021 1.7  0.7 - 4.0 K/uL Final   Monocytes Relative 09/09/2021 8  % Final   Monocytes Absolute 09/09/2021 0.7  0.1 - 1.0 K/uL Final   Eosinophils Relative 09/09/2021 1  % Final   Eosinophils Absolute 09/09/2021 0.1  0.0 - 0.5 K/uL Final   Basophils Relative 09/09/2021 1  % Final   Basophils Absolute 09/09/2021 0.1  0.0 - 0.1 K/uL Final   Immature Granulocytes 09/09/2021 0  % Final   Abs Immature Granulocytes 09/09/2021 0.03  0.00 - 0.07 K/uL Final   Performed at [facility], [place] [address] [place], [place] [address]   Ammonia 09/09/2021 37 (H)  9 - 35 umol/L Final   Comment: HEMOLYSIS AT THIS LEVEL MAY AFFECT RESULT Performed at [facility], [place] [address] [place], [place] [address]    Opiates 09/09/2021 NONE DETECTED  NONE DETECTED Final   Cocaine 09/09/2021 NONE DETECTED  NONE DETECTED Final   Benzodiazepines 09/09/2021 NONE DETECTED  NONE DETECTED Final   Amphetamines 09/09/2021 NONE DETECTED  NONE DETECTED Final   Tetrahydrocannabinol 09/09/2021 NONE DETECTED  NONE DETECTED Final   Barbiturates 09/09/2021 NONE DETECTED  NONE DETECTED Final   Comment: (NOTE) DRUG SCREEN FOR MEDICAL PURPOSES ONLY.   IF CONFIRMATION IS NEEDED FOR ANY PURPOSE, NOTIFY LAB WITHIN 5 DAYS.  LOWEST DETECTABLE LIMITS FOR URINE DRUG SCREEN Drug Class                     Cutoff (ng/mL) Amphetamine and metabolites    1000 Barbiturate and metabolites  200 Benzodiazepine                 704 Tricyclics and metabolites     300 Opiates and metabolites        300 Cocaine and metabolites        300 THC                            50 Performed at [facility], [place] [address] [place], [place] [address]    Alcohol, Ethyl (B) 09/09/2021 <10  <10 mg/dL Final   Comment: (NOTE) Lowest detectable limit for serum alcohol is 10 mg/dL.  For medical purposes only. Performed at [facility], [place] [address], [place], [place] [address]    Lipase 09/09/2021 33  11 - 51 U/L Final   Performed at [facility] [address] [place], [place] [address]   Color, Urine 09/09/2021 COLORLESS (A)  YELLOW Final   APPearance 09/09/2021 CLEAR  CLEAR Final   Specific Gravity, Urine 09/09/2021 1.002 (L)  1.005 - 1.030 Final   pH 09/09/2021 6.0  5.0 - 8.0 Final   Glucose, UA 09/09/2021 NEGATIVE  NEGATIVE mg/dL Final   Hgb urine dipstick 09/09/2021 NEGATIVE  NEGATIVE Final   Bilirubin Urine 09/09/2021 NEGATIVE  NEGATIVE Final   Ketones, ur 09/09/2021 NEGATIVE  NEGATIVE mg/dL Final   Protein, ur 09/09/2021 NEGATIVE  NEGATIVE mg/dL Final   Nitrite 09/09/2021 NEGATIVE  NEGATIVE Final   Leukocytes,Ua 09/09/2021 NEGATIVE  NEGATIVE Final   Performed at [facility] [address], [place], [place] [address]   Specimen Description 09/09/2021 URINE, CLEAN CATCH   Final   Special Requests 09/09/2021 NONE   Final   Culture 09/09/2021  (A)   Final                   Value:<10,000 COLONIES/mL INSIGNIFICANT GROWTH Performed at [facility], [place] [address], [place], [place] [address]    Report Status 09/09/2021 09/10/2021 FINAL   Final   D-Dimer, Quant 09/09/2021 0.92 (H)  0.00 - 0.50 ug/mL-FEU Final    Comment: (NOTE) At the manufacturer cut-off value of 0.5 g/mL FEU, this assay has a negative predictive value of 95-100%.This assay is intended for use in conjunction with a clinical pretest probability (PTP) assessment model to exclude pulmonary embolism (PE) and deep venous thrombosis (DVT) in outpatients suspected of PE or DVT. Results should be correlated with clinical presentation. Performed at [facility], [place] [address] [place], [place] [address]    Troponin I (High Sensitivity) 09/09/2021 5  <18 ng/L Final   Comment: (NOTE) Elevated high sensitivity troponin I (hsTnI) values and significant  changes across serial measurements may suggest ACS but many other  chronic and acute conditions are known to elevate hsTnI results.  Refer to the "Links" section for chest pain algorithms and additional  guidance. Performed at [facility], [place] [address] [place], [place] [address]    BP 09/10/2021 135/83  mmHg Final   S' Lateral 09/10/2021 2.30  cm Final   Area-P 1/2 09/10/2021 5.58  cm2 Final    Blood Alcohol level:  Lab Results  Component Value Date   ETH <10 11/23/2021   ETH <10 16/55/3748    Metabolic Disorder Labs: Lab Results  Component Value Date   HGBA1C 6.7 (H) 12/28/2021   MPG 145.59 12/28/2021   MPG 154.2 10/04/2021  Lab Results  Component Value Date   PROLACTIN 3.8 (L) 11/23/2021   Lab Results  Component Value Date   CHOL 171 11/23/2021   TRIG 125 11/23/2021   HDL 65 11/23/2021   CHOLHDL 2.6 11/23/2021   VLDL 25 11/23/2021   LDLCALC 81 11/23/2021   LDLCALC 95 10/04/2021    Therapeutic Lab Levels: No results found for: "LITHIUM" Lab Results  Component Value Date   VALPROATE 33 (L) 12/28/2021   VALPROATE 54 12/03/2021   No results found for: "CBMZ"  Physical Findings   PHQ2-9    [facility] [facility] from 11/23/2021 in [facility]  PHQ-2 Total Score 2  PHQ-9 Total Score 3      Flowsheet Row ED from  11/23/2021 in [facility] Most recent reading at 11/23/2021  6:44 PM ED from 11/22/2021 in [facility] Most recent reading at 11/22/2021  8:28 PM ED from 11/22/2021 in [facility] Most recent reading at 11/22/2021  1:24 AM  C-SSRS RISK CATEGORY No Risk No Risk No Risk        Musculoskeletal  Strength & Muscle Tone: within normal limits Gait & Station: normal Patient leans: N/A  Psychiatric Specialty Exam  Presentation  General Appearance:  Appropriate for Environment  Eye Contact: Fair  Speech: Clear and Coherent  Speech Volume: Normal  Handedness: Right   Mood and Affect  Mood: Dysphoric  Affect: Congruent   Thought Process  Thought Processes: Coherent  Descriptions of Associations:Intact  Orientation:Full (Time, Place and Person)  Thought Content:Logical  Diagnosis of Schizophrenia or Schizoaffective disorder in past: Yes  Duration of Psychotic Symptoms: No data recorded  Hallucinations:Hallucinations: None  Ideas of Reference:None  Suicidal Thoughts:Suicidal Thoughts: No  Homicidal Thoughts:Homicidal Thoughts: No   Sensorium  Memory: Immediate Fair; Recent Fair; Remote Fair  Judgment: Intact  Insight: Fair   Materials engineer: Fair  Attention Span: Fair  Recall: [employer] of Knowledge: Fair  Language: Fair   Psychomotor Activity  Psychomotor Activity: Psychomotor Activity: Normal   Assets  Assets: Communication Skills; Desire for Improvement   Sleep  Sleep: Sleep: Fair   Physical Exam  Physical Exam HENT:     Head: Normocephalic.     Nose: Nose normal.  Eyes:     Conjunctiva/sclera: Conjunctivae normal.  Cardiovascular:     Rate and Rhythm: Normal rate.  Pulmonary:     Effort: Pulmonary effort is normal.  Musculoskeletal:        General: Normal range of motion.     Cervical back: Normal range  of motion.  Neurological:     Mental Status: She is alert.    Review of Systems  Constitutional: Negative.   HENT: Negative.    Eyes: Negative.   Respiratory: Negative.    Cardiovascular: Negative.   Gastrointestinal: Negative.   Genitourinary: Negative.   Musculoskeletal: Negative.   Neurological: Negative.   Endo/Heme/Allergies: Negative.    Blood pressure (!) 108/54, pulse 75, temperature 97.6 F (36.4 C), temperature source Oral, resp. rate 16, SpO2 96 %. There is no height or weight on file to calculate BMI.  Treatment Plan Summary:  Disposition: Patient has been psychiatrically cleared and does not meet criteria for inpatient psychiatric treatment. She is unable to return to group home unless she has been assigned a guardian. She is unable to care for herself and has been boarding on the continuous assessment unit while awaiting DSS caseworker to find  appropriate placement. Social work/TOC working with DSS to find placement. Per Boris Lown, LCSW, Per Kevin Fenton, [facility] social work supervisor, their attorney determined the patient has the capacity to make placement decisions and they will not be seeking guardianship.    Current medication regimen. No medication changes on 01/01/22.   apixaban  5 mg Oral BID   clozapine  50 mg Oral Daily   cloZAPine  75 mg Oral QHS   diltiazem  240 mg Oral Daily   divalproex  500 mg Oral BID   fluticasone furoate-vilanterol  1 puff Inhalation Daily   insulin aspart  0-9 Units Subcutaneous TID WC   insulin glargine-yfgn  10 Units Subcutaneous Daily   latanoprost  1 drop Both Eyes QHS   menthol-cetylpyridinium  1 lozenge Oral Once   metFORMIN  1,000 mg Oral BID WC   multivitamins with iron  1 tablet Oral Daily   nicotine  7 mg Transdermal Daily   polyethylene glycol  17 g Oral Daily   Vitamin D (Ergocalciferol)  50,000 Units Oral Q7 days    Labs reviewed:  CBG this am-92   Per Lawana Pai, [facility], Next [place] due on 11/7. Most recent [place]  =3100 on 10/31  Vital signs B/P 108/54. HR 75. Resp 16 and Temp 97.6   Gaudencio Chesnut L, NP 01/01/2022 10:46 AM

## 2022-01-01 NOTE — ED Notes (Signed)
Pt A&O x4 , resting watching TV at present, no distress noted.  Monitoring for safety.

## 2022-01-01 NOTE — ED Notes (Signed)
Snacks given 

## 2022-01-02 DIAGNOSIS — F209 Schizophrenia, unspecified: Secondary | ICD-10-CM | POA: Diagnosis not present

## 2022-01-02 LAB — GLUCOSE, CAPILLARY
Glucose-Capillary: 93 mg/dL (ref 70–99)
Glucose-Capillary: 133 mg/dL — ABNORMAL HIGH (ref 70–99)
Glucose-Capillary: 154 mg/dL — ABNORMAL HIGH (ref 70–99)
Glucose-Capillary: 189 mg/dL — ABNORMAL HIGH (ref 70–99)

## 2022-01-02 NOTE — ED Notes (Signed)
Pt sitting quietly coloring and watching tv. Denies pain, SI, HI, and AVH.  Breathing is even and unlabored.  Will continue to monitor for safety.

## 2022-01-02 NOTE — ED Notes (Signed)
Pt calm, cooperative. Awaken to use restroom and drink water. Laying in coloring. No acute distress noted at this time.

## 2022-01-02 NOTE — ED Provider Notes (Signed)
Behavioral Health Progress Note  Date and Time: 01/02/2022 5:27 PM Name: Teresa Murillo MRN:  [id number]  Per chart review, Teresa Murillo 61 year old female with a past psychiatric history of schizophrenia, aggressive behavior, and possible intellectual disability presented to [facility] on 11/23/21 voluntarily as a walk in via [facility] with complaints that she was locked out of the group home. Reports that owner of group home called police but doesn't know why.  Group homeowner Teresa Murillo at [phone] reports patient only living there temporarily while DSS look for another place to live.  Reports patient has already been discharged from facility. Reported unwilling to take back unless patient has a guardian related to patient leaving, staying gone for days and missing doses of medications.  Reports that patient is unable to care for herself, needs reminders to shower and complete ADL, also needs assistance with medication management   Subjective: Patient seen and evaluated face-to-face by this provider, and chart reviewed. On evaluation, patient is alert and oriented x 3. Her thought process is linear and speech is clear and coherent. Her mood is euthymic and affect is congruent. She denies suicidal ideations. She denies homicidal ideations. She denies auditory or visual hallucinations. There is no objective evidence that the patient is currently responding to internal or external stimuli. She denies depressive symptoms. She reports fair sleep. She reports a fair appetite.  She is noted to be coloring and shows this provider her coloring sheets. She continues to express concerns regarding placement. She was advised that social work may have an update on Monday. She states that she talked to Ava, [place] on Friday. She is medication compliant and denies medication side effects at this time. She denies physical complaints.  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 On the Wechsler Adult Intelligence Scale-4, Ms. Croson achieved a full-scale IQ score of 73 and a percentile rank of 4 placing her in the borderline range of intellectual functioning.    Chronic obstructive pulmonary disease (COPD) (HCC)    Glaucoma    Hyperlipidemia    Hypertension    Iron deficiency    Schizoaffective disorder (HCC)    Type 2 diabetes mellitus (HCC)     Past Surgical History:  Procedure Laterality Date   TUBAL LIGATION     Family History:  Family History  Problem Relation Age of Onset   Breast cancer Maternal Grandmother    Family Psychiatric  History: No history reported.  Social History:  Social History   Substance and Sexual Activity  Alcohol Use Yes     Social History   Substance and Sexual Activity  Drug Use Not Currently    Social History   Socioeconomic History   Marital status: Divorced    Spouse name: Not on file   Number of children: Not on file   Years of education: Not on file   Highest education level: Not on file  Occupational History   Not on file  Tobacco Use   Smoking status: Every Day    Packs/day: 1.00    Types: Cigars, Cigarettes   Smokeless tobacco: Current  Vaping Use   Vaping Use: Never  used  Substance and Sexual Activity   Alcohol use: Yes   Drug use: Not Currently   Sexual activity: Not Currently    Birth control/protection: Surgical  Other Topics Concern   Not on file  Social History Narrative   Not on file   Social Determinants of Health   Financial Resource Strain: Not on file  Food Insecurity: Not on file  Transportation  Needs: Not on file  Physical Activity: Not on file  Stress: Not on file  Social Connections: Not on file   SDOH:  SDOH Screenings   Depression (PHQ2-9): Low Risk  (11/23/2021)  Tobacco Use: High Risk (12/30/2021)   Additional Social History:    Pain Medications: See MAR Prescriptions: See MAR Over the Counter: See MAR History of alcohol / drug use?: No history of alcohol / drug abuse Longest period of sobriety (when/how long): N/A    Current Medications:  Current Facility-Administered Medications  Medication Dose Route Frequency Provider Last Rate Last Admin   acetaminophen (TYLENOL) tablet 650 mg  650 mg Oral Q6H PRN Rankin, Shuvon B, NP   650 mg at 01/01/22 2304   albuterol (VENTOLIN HFA) 108 (90 Base) MCG/ACT inhaler 2 puff  2 puff Inhalation Q6H PRN Rankin, Shuvon B, NP   2 puff at 12/09/21 2102   alum & mag hydroxide-simeth (MAALOX/MYLANTA) 200-200-20 MG/5ML suspension 30 mL  30 mL Oral Q4H PRN Rankin, Shuvon B, NP   30 mL at 12/19/21 2109   apixaban (ELIQUIS) tablet 5 mg  5 mg Oral BID Rankin, Shuvon B, NP   5 mg at 01/02/22 1010   cloZAPine (CLOZARIL) tablet 50 mg  50 mg Oral Daily [name], NP   50 mg at 01/02/22 1010   cloZAPine (CLOZARIL) tablet 75 mg  75 mg Oral QHS [name], NP   75 mg at 01/01/22 2132   diltiazem (CARDIZEM CD) 24 hr capsule 240 mg  240 mg Oral Daily Rankin, Shuvon B, NP   240 mg at 01/02/22 1010   divalproex (DEPAKOTE ER) 24 hr tablet 500 mg  500 mg Oral BID Rankin, Shuvon B, NP   500 mg at 01/02/22 1010   fluticasone furoate-vilanterol (BREO ELLIPTA) 200-25 MCG/ACT 1 puff  1 puff Inhalation Daily Rankin, Shuvon B, NP   1 puff at 01/02/22 0732   haloperidol (HALDOL) tablet 10 mg  10 mg Oral Q8H PRN Rankin, Shuvon B, NP       insulin aspart (novoLOG) injection 0-9 Units  0-9 Units Subcutaneous TID WC Tharon Aquas, NP   2 Units at 01/02/22 1706   insulin glargine-yfgn (SEMGLEE) injection 10 Units  10 Units Subcutaneous Daily [name],  DO   10 Units at 01/02/22 1010   latanoprost (XALATAN) 0.005 % ophthalmic solution 1 drop  1 drop Both Eyes QHS Rankin, Shuvon B, NP   1 drop at 01/01/22 2135   magnesium hydroxide (MILK OF MAGNESIA) suspension 30 mL  30 mL Oral Daily PRN Rankin, Shuvon B, NP       menthol-cetylpyridinium (CEPACOL) lozenge 3 mg  1 lozenge Oral Once [name], NP       menthol-cetylpyridinium (CEPACOL) lozenge 3 mg  1 lozenge Oral PRN [name], NP   3 mg at 01/02/22 0127   metFORMIN (GLUCOPHAGE-XR) 24 hr tablet 1,000 mg  1,000 mg Oral BID WC [name], DO   1,000 mg at 01/02/22 1653   multivitamins with iron tablet 1 tablet  1 tablet Oral Daily [name], DO  1 tablet at 01/02/22 1010   nicotine (NICODERM CQ - dosed in mg/24 hr) patch 7 mg  7 mg Transdermal Daily Rankin, Shuvon B, NP   7 mg at 01/02/22 1015   polyethylene glycol (MIRALAX / GLYCOLAX) packet 17 g  17 g Oral Daily [name], DO   17 g at 01/02/22 1010   Vitamin D (Ergocalciferol) (DRISDOL) 1.25 MG (50000 UNIT) capsule 50,000 Units  50,000 Units Oral Q7 days Rankin, Shuvon B, NP   50,000 Units at 12/31/21 1019   Current Outpatient Medications  Medication Sig Dispense Refill   Accu-Chek Softclix Lancets lancets Use as directed up to four times daily 100 each 0   apixaban (ELIQUIS) 5 MG TABS tablet Take 1 tablet (5 mg total) by mouth 2 (two) times daily. 60 tablet 0   ARIPiprazole (ABILIFY) 10 MG tablet Take 1 tablet (10 mg total) by mouth daily. 30 tablet 0   Blood Glucose Monitoring Suppl (ACCU-CHEK GUIDE) w/Device KIT Use as directed up to four times daily 1 kit 0   budesonide-formoterol (SYMBICORT) 160-4.5 MCG/ACT inhaler Inhale 2 puffs into the lungs in the morning and at bedtime.     Cholecalciferol (VITAMIN D3) 1.25 MG (50000 UT) CAPS Take 50,000 Units by mouth every Thursday.     cloZAPine (CLOZARIL) 25 MG tablet Take 3 tablets (75 mg total) by mouth at bedtime. 90 tablet 0   clozapine (CLOZARIL) 50 MG tablet Take 1 tablet  (50 mg total) by mouth daily. 30 tablet 0   diltiazem (CARDIZEM CD) 240 MG 24 hr capsule Take 1 capsule (240 mg total) by mouth daily. (Patient not taking: Reported on 11/24/2021) 30 capsule 0   divalproex (DEPAKOTE ER) 500 MG 24 hr tablet Take 1 tablet (500 mg total) by mouth 2 (two) times daily. 60 tablet 0   glucose blood test strip Use as directed up to four times daily 50 each 0   haloperidol (HALDOL) 10 MG tablet Take 1 tablet (10 mg total) by mouth 3 (three) times daily as needed for agitation (and psychotic symptoms).     INGREZZA 40 MG capsule Take 1 capsule (40 mg total) by mouth in the morning. 30 capsule 0   insulin aspart (NOVOLOG) 100 UNIT/ML FlexPen Before each meal 3 times a day, 140-199 - 2 units, 200-250 - 4 units, 251-299 - 6 units,  300-349 - 8 units,  350 or above 10 units. Insulin PEN if approved, provide syringes and needles if needed.Please switch to any approved short acting Insulin if needed. 15 mL 0   insulin glargine (LANTUS) 100 UNIT/ML Solostar Pen Inject 12 Units into the skin daily. 15 mL 0   Insulin Pen Needle 32G X 4 MM MISC Use 4 times a day with insulin, 1 month supply. 100 each 0   latanoprost (XALATAN) 0.005 % ophthalmic solution Place 1 drop into both eyes at bedtime.     metFORMIN (GLUCOPHAGE) 500 MG tablet Take 500 mg by mouth 2 (two) times daily with a meal.     nicotine (NICODERM CQ - DOSED IN MG/24 HR) 7 mg/24hr patch Place 1 patch (7 mg total) onto the skin daily. 28 patch 0   PROAIR HFA 108 (90 Base) MCG/ACT inhaler Inhale 2 puffs into the lungs every 6 (six) hours as needed for wheezing or shortness of breath.      Labs  Lab Results:  No results displayed because visit has over 200 results.    Admission on 11/22/2021, Discharged on 11/22/2021  Component Date Value Ref Range Status   Glucose-Capillary 11/22/2021 169 (H)  70 - 99 mg/dL Final   Glucose reference range applies only to samples taken after fasting for at least 8 hours.  No results  displayed because visit has over 200 results.    Admission on 10/04/2021, Discharged on 10/22/2021  Component Date Value Ref Range Status   SARS Coronavirus 2 by RT PCR 10/04/2021 NEGATIVE  NEGATIVE Final   Comment: (NOTE) SARS-CoV-2 target nucleic acids are NOT DETECTED.  The SARS-CoV-2 RNA is generally detectable in upper respiratory specimens during the acute phase of infection. The lowest concentration of SARS-CoV-2 viral copies this assay can detect is 138 copies/mL. A negative result does not preclude SARS-Cov-2 infection and should not be used as the sole basis for treatment or other patient management decisions. A negative result may occur with  improper specimen collection/handling, submission of specimen other than nasopharyngeal swab, presence of viral mutation(s) within the areas targeted by this assay, and inadequate number of viral copies(<138 copies/mL). A negative result must be combined with clinical observations, patient history, and epidemiological information. The expected result is Negative.  Fact Sheet for Patients:  EntrepreneurPulse.com.au  Fact Sheet for Healthcare Providers:  IncredibleEmployment.be  This test is no                          t yet approved or cleared by the [place] FDA and  has been authorized for detection and/or diagnosis of SARS-CoV-2 by FDA under an Emergency Use Authorization (EUA). This EUA will remain  in effect (meaning this test can be used) for the duration of the COVID-19 declaration under Section 564(b)(1) of the Act, 21 U.S.C.section 360bbb-3(b)(1), unless the authorization is terminated  or revoked sooner.       Influenza A by PCR 10/04/2021 NEGATIVE  NEGATIVE Final   Influenza B by PCR 10/04/2021 NEGATIVE  NEGATIVE Final   Comment: (NOTE) The Xpert Xpress SARS-CoV-2/FLU/RSV plus assay is intended as an aid in the diagnosis of influenza from Nasopharyngeal swab specimens  and should not be used as a sole basis for treatment. Nasal washings and aspirates are unacceptable for Xpert Xpress SARS-CoV-2/FLU/RSV testing.  Fact Sheet for Patients: EntrepreneurPulse.com.au  Fact Sheet for Healthcare Providers: IncredibleEmployment.be  This test is not yet approved or cleared by the [place] FDA and has been authorized for detection and/or diagnosis of SARS-CoV-2 by FDA under an Emergency Use Authorization (EUA). This EUA will remain in effect (meaning this test can be used) for the duration of the COVID-19 declaration under Section 564(b)(1) of the Act, 21 U.S.C. section 360bbb-3(b)(1), unless the authorization is terminated or revoked.  Performed at [facility], [place] [address] [place], [place] [address]    WBC 10/04/2021 8.4  4.0 - 10.5 K/uL Final   RBC 10/04/2021 4.42  3.87 - 5.11 MIL/uL Final   Hemoglobin 10/04/2021 11.6 (L)  12.0 - 15.0 g/dL Final   HCT 10/04/2021 35.7 (L)  36.0 - 46.0 % Final   MCV 10/04/2021 80.8  80.0 - 100.0 fL Final   MCH 10/04/2021 26.2  26.0 - 34.0 pg Final   MCHC 10/04/2021 32.5  30.0 - 36.0 g/dL Final   RDW 10/04/2021 16.0 (H)  11.5 - 15.5 % Final   Platelets 10/04/2021 372  150 - 400 K/uL Final   nRBC 10/04/2021 0.0  0.0 - 0.2 % Final   Neutrophils Relative % 10/04/2021 68  % Final  Neutro Abs 10/04/2021 5.7  1.7 - 7.7 K/uL Final   Lymphocytes Relative 10/04/2021 22  % Final   Lymphs Abs 10/04/2021 1.8  0.7 - 4.0 K/uL Final   Monocytes Relative 10/04/2021 8  % Final   Monocytes Absolute 10/04/2021 0.7  0.1 - 1.0 K/uL Final   Eosinophils Relative 10/04/2021 1  % Final   Eosinophils Absolute 10/04/2021 0.1  0.0 - 0.5 K/uL Final   Basophils Relative 10/04/2021 1  % Final   Basophils Absolute 10/04/2021 0.1  0.0 - 0.1 K/uL Final   Immature Granulocytes 10/04/2021 0  % Final   Abs Immature Granulocytes 10/04/2021 0.03  0.00 - 0.07 K/uL Final   Performed at [facility] [address], [place], [place] [address]   Sodium 10/04/2021 136  135 - 145 mmol/L Final   Potassium 10/04/2021 4.2  3.5 - 5.1 mmol/L Final   Chloride 10/04/2021 104  98 - 111 mmol/L Final   CO2 10/04/2021 25  22 - 32 mmol/L Final   Glucose, Bld 10/04/2021 100 (H)  70 - 99 mg/dL Final   Glucose reference range applies only to samples taken after fasting for at least 8 hours.   BUN 10/04/2021 9  6 - 20 mg/dL Final   Creatinine, Ser 10/04/2021 0.52  0.44 - 1.00 mg/dL Final   Calcium 10/04/2021 9.0  8.9 - 10.3 mg/dL Final   Total Protein 10/04/2021 7.0  6.5 - 8.1 g/dL Final   Albumin 10/04/2021 3.2 (L)  3.5 - 5.0 g/dL Final   AST 10/04/2021 13 (L)  15 - 41 U/L Final   ALT 10/04/2021 10  0 - 44 U/L Final   Alkaline Phosphatase 10/04/2021 61  38 - 126 U/L Final   Total Bilirubin 10/04/2021 0.3  0.3 - 1.2 mg/dL Final   GFR, Estimated 10/04/2021 >60  >60 mL/min Final   Comment: (NOTE) Calculated using the CKD-EPI Creatinine Equation (2021)    Anion gap 10/04/2021 7  5 - 15 Final   Performed at [facility] [address], [place], [place] [address]   Hgb A1c MFr Bld 10/04/2021 7.0 (H)  4.8 - 5.6 % Final   Comment: (NOTE) Pre diabetes:          5.7%-6.4%  Diabetes:              >6.4%  Glycemic control for   <7.0% adults with diabetes    Mean Plasma Glucose 10/04/2021 154.2  mg/dL Final   Performed at [facility], [place] [address] [place], [place] [address]   Cholesterol 10/04/2021 178  0 - 200 mg/dL Final   Triglycerides 10/04/2021 155 (H)  <150 mg/dL Final   HDL 10/04/2021 52  >40 mg/dL Final   Total CHOL/HDL Ratio 10/04/2021 3.4  RATIO Final   VLDL 10/04/2021 31  0 - 40 mg/dL Final   LDL Cholesterol 10/04/2021 95  0 - 99 mg/dL Final   Comment:        Total Cholesterol/HDL:CHD Risk Coronary Heart Disease Risk Table                     Men   Women  1/2 Average Risk   3.4   3.3  Average Risk       5.0   4.4  2 X Average Risk   9.6   7.1  3 X Average Risk  23.4    11.0        Use  the calculated Patient Ratio above and the CHD Risk Table to determine the patient's CHD Risk.        ATP III CLASSIFICATION (LDL):  <100     mg/dL   Optimal  100-129  mg/dL   Near or Above                    Optimal  130-159  mg/dL   Borderline  160-189  mg/dL   High  >190     mg/dL   Very High Performed at [facility] [address], [place], [place] [address]    POC Amphetamine UR 10/04/2021 None Detected  NONE DETECTED (Cut Off Level 1000 ng/mL) Final   POC Secobarbital (BAR) 10/04/2021 None Detected  NONE DETECTED (Cut Off Level 300 ng/mL) Final   POC Buprenorphine (BUP) 10/04/2021 None Detected  NONE DETECTED (Cut Off Level 10 ng/mL) Final   POC Oxazepam (BZO) 10/04/2021 None Detected  NONE DETECTED (Cut Off Level 300 ng/mL) Final   POC Cocaine UR 10/04/2021 None Detected  NONE DETECTED (Cut Off Level 300 ng/mL) Final   POC Methamphetamine UR 10/04/2021 None Detected  NONE DETECTED (Cut Off Level 1000 ng/mL) Final   POC Morphine 10/04/2021 None Detected  NONE DETECTED (Cut Off Level 300 ng/mL) Final   POC Methadone UR 10/04/2021 None Detected  NONE DETECTED (Cut Off Level 300 ng/mL) Final   POC Oxycodone UR 10/04/2021 Positive (A)  NONE DETECTED (Cut Off Level 100 ng/mL) Final   POC Marijuana UR 10/04/2021 None Detected  NONE DETECTED (Cut Off Level 50 ng/mL) Final   SARSCOV2ONAVIRUS 2 AG 10/04/2021 NEGATIVE  NEGATIVE Final   Comment: (NOTE) SARS-CoV-2 antigen NOT DETECTED.   Negative results are presumptive.  Negative results do not preclude SARS-CoV-2 infection and should not be used as the sole basis for treatment or other patient management decisions, including infection  control decisions, particularly in the presence of clinical signs and  symptoms consistent with COVID-19, or in those who have been in contact with the virus.  Negative results must be combined with clinical observations, patient history, and epidemiological information. The  expected result is Negative.  Fact Sheet for Patients: HandmadeRecipes.com.cy  Fact Sheet for Healthcare Providers: FuneralLife.at  This test is not yet approved or cleared by the [place] FDA and  has been authorized for detection and/or diagnosis of SARS-CoV-2 by FDA under an Emergency Use Authorization (EUA).  This EUA will remain in effect (meaning this test can be used) for the duration of  the COV                          ID-19 declaration under Section 564(b)(1) of the Act, 21 U.S.C. section 360bbb-3(b)(1), unless the authorization is terminated or revoked sooner.     Valproic Acid Lvl 10/04/2021 51  50.0 - 100.0 ug/mL Final   Performed at [facility] [address] [place], [place] [address]   Valproic Acid Lvl 10/08/2021 57  50.0 - 100.0 ug/mL Final   Performed at [facility] [address] [place], [place] [address]   Glucose-Capillary 10/09/2021 123 (H)  70 - 99 mg/dL Final   Glucose reference range applies only to samples taken after fasting for at least 8 hours.  Admission on 09/09/2021, Discharged on 09/10/2021  Component Date Value Ref Range Status   Sodium 09/09/2021 134 (L)  135 - 145 mmol/L Final   Potassium 09/09/2021 4.3  3.5 - 5.1 mmol/L Final   Chloride 09/09/2021 99  98 - 111 mmol/L Final   CO2 09/09/2021 25  22 - 32 mmol/L Final   Glucose, Bld 09/09/2021 107 (H)  70 - 99 mg/dL Final   Glucose reference range applies only to samples taken after fasting for at least 8 hours.   BUN 09/09/2021 12  6 - 20 mg/dL Final   Creatinine, Ser 09/09/2021 0.74  0.44 - 1.00 mg/dL Final   Calcium 09/09/2021 9.0  8.9 - 10.3 mg/dL Final   Total Protein 09/09/2021 7.4  6.5 - 8.1 g/dL Final   Albumin 09/09/2021 3.1 (L)  3.5 - 5.0 g/dL Final   AST 09/09/2021 13 (L)  15 - 41 U/L Final   ALT 09/09/2021 13  0 - 44 U/L Final   Alkaline Phosphatase 09/09/2021 66  38 - 126 U/L Final   Total Bilirubin 09/09/2021  0.2 (L)  0.3 - 1.2 mg/dL Final   GFR, Estimated 09/09/2021 >60  >60 mL/min Final   Comment: (NOTE) Calculated using the CKD-EPI Creatinine Equation (2021)    Anion gap 09/09/2021 10  5 - 15 Final   Performed at [facility], [place] [address] [place], [place] [address]   WBC 09/09/2021 8.5  4.0 - 10.5 K/uL Final   RBC 09/09/2021 4.53  3.87 - 5.11 MIL/uL Final   Hemoglobin 09/09/2021 11.8 (L)  12.0 - 15.0 g/dL Final   HCT 09/09/2021 38.0  36.0 - 46.0 % Final   MCV 09/09/2021 83.9  80.0 - 100.0 fL Final   MCH 09/09/2021 26.0  26.0 - 34.0 pg Final   MCHC 09/09/2021 31.1  30.0 - 36.0 g/dL Final   RDW 09/09/2021 17.8 (H)  11.5 - 15.5 % Final   Platelets 09/09/2021 442 (H)  150 - 400 K/uL Final   nRBC 09/09/2021 0.0  0.0 - 0.2 % Final   Neutrophils Relative % 09/09/2021 70  % Final   Neutro Abs 09/09/2021 5.9  1.7 - 7.7 K/uL Final   Lymphocytes Relative 09/09/2021 20  % Final   Lymphs Abs 09/09/2021 1.7  0.7 - 4.0 K/uL Final   Monocytes Relative 09/09/2021 8  % Final   Monocytes Absolute 09/09/2021 0.7  0.1 - 1.0 K/uL Final   Eosinophils Relative 09/09/2021 1  % Final   Eosinophils Absolute 09/09/2021 0.1  0.0 - 0.5 K/uL Final   Basophils Relative 09/09/2021 1  % Final   Basophils Absolute 09/09/2021 0.1  0.0 - 0.1 K/uL Final   Immature Granulocytes 09/09/2021 0  % Final   Abs Immature Granulocytes 09/09/2021 0.03  0.00 - 0.07 K/uL Final   Performed at [facility], [place] [address] [place], [place] [address]   Ammonia 09/09/2021 37 (H)  9 - 35 umol/L Final   Comment: HEMOLYSIS AT THIS LEVEL MAY AFFECT RESULT Performed at [facility], [place] [address], [place], [place] [address]    Opiates 09/09/2021 NONE DETECTED  NONE DETECTED Final   Cocaine 09/09/2021 NONE DETECTED  NONE DETECTED Final   Benzodiazepines 09/09/2021 NONE DETECTED  NONE DETECTED Final   Amphetamines 09/09/2021 NONE DETECTED  NONE DETECTED Final   Tetrahydrocannabinol 09/09/2021 NONE DETECTED  NONE  DETECTED Final   Barbiturates 09/09/2021 NONE DETECTED  NONE DETECTED Final   Comment: (NOTE) DRUG SCREEN FOR MEDICAL PURPOSES ONLY.  IF CONFIRMATION IS NEEDED FOR ANY PURPOSE, NOTIFY LAB WITHIN 5 DAYS.  LOWEST DETECTABLE LIMITS FOR URINE DRUG SCREEN Drug Class  Cutoff (ng/mL) Amphetamine and metabolites    1000 Barbiturate and metabolites    200 Benzodiazepine                 517 Tricyclics and metabolites     300 Opiates and metabolites        300 Cocaine and metabolites        300 THC                            50 Performed at [facility], [place] [address] [place], [place] [address]    Alcohol, Ethyl (B) 09/09/2021 <10  <10 mg/dL Final   Comment: (NOTE) Lowest detectable limit for serum alcohol is 10 mg/dL.  For medical purposes only. Performed at [facility], [place] [address] [place], [place] [address]    Lipase 09/09/2021 33  11 - 51 U/L Final   Performed at [facility] [address] [place], [place] [address]   Color, Urine 09/09/2021 COLORLESS (A)  YELLOW Final   APPearance 09/09/2021 CLEAR  CLEAR Final   Specific Gravity, Urine 09/09/2021 1.002 (L)  1.005 - 1.030 Final   pH 09/09/2021 6.0  5.0 - 8.0 Final   Glucose, UA 09/09/2021 NEGATIVE  NEGATIVE mg/dL Final   Hgb urine dipstick 09/09/2021 NEGATIVE  NEGATIVE Final   Bilirubin Urine 09/09/2021 NEGATIVE  NEGATIVE Final   Ketones, ur 09/09/2021 NEGATIVE  NEGATIVE mg/dL Final   Protein, ur 09/09/2021 NEGATIVE  NEGATIVE mg/dL Final   Nitrite 09/09/2021 NEGATIVE  NEGATIVE Final   Leukocytes,Ua 09/09/2021 NEGATIVE  NEGATIVE Final   Performed at [facility] [address] [place], [place] [address]   Specimen Description 09/09/2021 URINE, CLEAN CATCH   Final   Special Requests 09/09/2021 NONE   Final   Culture 09/09/2021  (A)   Final                   Value:<10,000 COLONIES/mL INSIGNIFICANT GROWTH Performed at [facility], [place] [address] [place], [place]  [address]    Report Status 09/09/2021 09/10/2021 FINAL   Final   D-Dimer, Quant 09/09/2021 0.92 (H)  0.00 - 0.50 ug/mL-FEU Final   Comment: (NOTE) At the manufacturer cut-off value of 0.5 g/mL FEU, this assay has a negative predictive value of 95-100%.This assay is intended for use in conjunction with a clinical pretest probability (PTP) assessment model to exclude pulmonary embolism (PE) and deep venous thrombosis (DVT) in outpatients suspected of PE or DVT. Results should be correlated with clinical presentation. Performed at [facility], [place] [address] [place], [place] [address]    Troponin I (High Sensitivity) 09/09/2021 5  <18 ng/L Final   Comment: (NOTE) Elevated high sensitivity troponin I (hsTnI) values and significant  changes across serial measurements may suggest ACS but many other  chronic and acute conditions are known to elevate hsTnI results.  Refer to the "Links" section for chest pain algorithms and additional  guidance. Performed at [facility], [place] [address], [place], [place] [address]    BP 09/10/2021 135/83  mmHg Final   S' Lateral 09/10/2021 2.30  cm Final   Area-P 1/2 09/10/2021 5.58  cm2 Final    Blood Alcohol level:  Lab Results  Component Value Date   ETH <10 11/23/2021   ETH <10 00/92/3300    Metabolic Disorder Labs: Lab Results  Component Value Date   HGBA1C  6.7 (H) 12/28/2021   MPG 145.59 12/28/2021   MPG 154.2 10/04/2021   Lab Results  Component Value Date   PROLACTIN 3.8 (L) 11/23/2021   Lab Results  Component Value Date   CHOL 171 11/23/2021   TRIG 125 11/23/2021   HDL 65 11/23/2021   CHOLHDL 2.6 11/23/2021   VLDL 25 11/23/2021   LDLCALC 81 11/23/2021   LDLCALC 95 10/04/2021    Therapeutic Lab Levels: No results found for: "LITHIUM" Lab Results  Component Value Date   VALPROATE 33 (L) 12/28/2021   VALPROATE 54 12/03/2021   No results found for: "CBMZ"  Physical Findings   PHQ2-9    [place] ED  from 11/23/2021 in [facility]  PHQ-2 Total Score 2  PHQ-9 Total Score 3      Flowsheet Row ED from 11/23/2021 in [facility] Most recent reading at 11/23/2021  6:44 PM ED from 11/22/2021 in [facility] Most recent reading at 11/22/2021  8:28 PM ED from 11/22/2021 in [facility] Most recent reading at 11/22/2021  1:24 AM  C-SSRS RISK CATEGORY No Risk No Risk No Risk        Musculoskeletal  Strength & Muscle Tone: within normal limits Gait & Station: normal Patient leans: N/A  Psychiatric Specialty Exam  Presentation  General Appearance:  Appropriate for Environment  Eye Contact: Fair  Speech: Clear and Coherent  Speech Volume: Normal  Handedness: Right   Mood and Affect  Mood: Euthymic  Affect: Congruent   Thought Process  Thought Processes: Coherent  Descriptions of Associations:Intact  Orientation:Full (Time, Place and Person)  Thought Content:Logical  Diagnosis of Schizophrenia or Schizoaffective disorder in past: Yes  Duration of Psychotic Symptoms: No data recorded  Hallucinations:Hallucinations: None  Ideas of Reference:None  Suicidal Thoughts:Suicidal Thoughts: No  Homicidal Thoughts:Homicidal Thoughts: No   Sensorium  Memory: Immediate Fair; Recent Fair; Remote Fair  Judgment: Intact  Insight: Present   Executive Functions  Concentration: Fair  Attention Span: Fair  Recall: [employer] of Knowledge: Fair  Language: Fair   Psychomotor Activity  Psychomotor Activity: Psychomotor Activity: Normal   Assets  Assets: Communication Skills; Desire for Improvement   Sleep  Sleep: Sleep: Fair   No data recorded  Physical Exam  Physical Exam HENT:     Head: Normocephalic.     Nose: Nose normal.  Eyes:     Conjunctiva/sclera: Conjunctivae normal.  Cardiovascular:     Rate and Rhythm:  Normal rate.  Pulmonary:     Effort: Pulmonary effort is normal.  Musculoskeletal:        General: Normal range of motion.  Neurological:     Mental Status: She is alert and oriented to person, place, and time.    Review of Systems  Constitutional: Negative.   HENT: Negative.    Eyes: Negative.   Respiratory: Negative.    Cardiovascular: Negative.   Gastrointestinal: Negative.   Genitourinary: Negative.   Endo/Heme/Allergies: Negative.    Blood pressure (!) 140/70, pulse 77, temperature 97.7 F (36.5 C), temperature source Oral, resp. rate 14, SpO2 97 %. There is no height or weight on file to calculate BMI.  Treatment Plan Summary:  Disposition: Patient has been psychiatrically cleared and does not meet criteria for inpatient psychiatric treatment. She is unable to return to group home unless she has been assigned a guardian. She is unable to care for herself and has been boarding on the continuous  assessment unit while awaiting DSS caseworker to find appropriate placement. Social work/TOC working with DSS to find placement. Per Boris Lown, LCSW, Per Kevin Fenton, [facility] social work supervisor, their attorney determined the patient has the capacity to make placement decisions and they will not be seeking guardianship.   No  medication changes on 01/02/22  apixaban  5 mg Oral BID   clozapine  50 mg Oral Daily   cloZAPine  75 mg Oral QHS   diltiazem  240 mg Oral Daily   divalproex  500 mg Oral BID   fluticasone furoate-vilanterol  1 puff Inhalation Daily   insulin aspart  0-9 Units Subcutaneous TID WC   insulin glargine-yfgn  10 Units Subcutaneous Daily   latanoprost  1 drop Both Eyes QHS   menthol-cetylpyridinium  1 lozenge Oral Once   metFORMIN  1,000 mg Oral BID WC   multivitamins with iron  1 tablet Oral Daily   nicotine  7 mg Transdermal Daily   polyethylene glycol  17 g Oral Daily   Vitamin D (Ergocalciferol)  50,000 Units Oral Q7 days   Labs:  CBG 154   Meghna Hagmann,  Clary Boulais L, NP 01/02/2022 5:27 PM

## 2022-01-02 NOTE — ED Notes (Signed)
Patient ate breakfast and has no compliants. Denies SI/HI/AVH. Pt is safe on unit.

## 2022-01-03 DIAGNOSIS — F209 Schizophrenia, unspecified: Secondary | ICD-10-CM | POA: Diagnosis not present

## 2022-01-03 LAB — GLUCOSE, CAPILLARY
Glucose-Capillary: 100 mg/dL — ABNORMAL HIGH (ref 70–99)
Glucose-Capillary: 80 mg/dL (ref 70–99)
Glucose-Capillary: 93 mg/dL (ref 70–99)
Glucose-Capillary: 101 mg/dL — ABNORMAL HIGH (ref 70–99)

## 2022-01-03 NOTE — ED Notes (Signed)
Pt resting quietly with eyes closed.  No pain or discomfort noted/voiced.  Breathing is even and unlabored.  Will continue to monitor for safety.  

## 2022-01-03 NOTE — ED Provider Notes (Signed)
Behavioral Health Progress Note  Date and Time: 01/03/2022 12:30 PM Name: Teresa Murillo MRN:  [id number]   HPI: Per chart review, Teresa Murillo 61 year old female with a past psychiatric history of schizophrenia, aggressive behavior, and possible intellectual disability presented to [facility] on 11/23/21 voluntarily as a walk in via [facility] with complaints that she was locked out of the group home. Reports that owner of group home called police but doesn't know why.  Group homeowner [name] at [phone] reports patient only living there temporarily while DSS look for another place to live.  Reports patient has already been discharged from facility. Reported unwilling to take back unless patient has a guardian related to patient leaving, staying gone for days and missing doses of medications. Reports that patient is unable to care for herself, needs reminders to shower and complete ADL, also needs assistance with medication management  Subjective: Patient seen and evaluated face-to-face by this provider, and chart reviewed. On evaluation, patient is alert and oriented x 3. Her thought process is linear and speech is clear and coherent. Her mood is euthymic and affect is congruent. She denies suicidal ideations. She denies homicidal ideations. She denies auditory or visual hallucinations. There is no objective evidence that the patient is currently responding to internal or external stimuli. She denies depressive symptoms. She reports fair sleep. She reports a fair appetite. She is medication compliant and denies medication side effects at this time. She denies physical complaints.   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 On the Wechsler Adult Intelligence Scale-4, Ms. Hawkey achieved a full-scale IQ score of 73 and a percentile rank of 4 placing her in the borderline range of intellectual functioning.    Chronic obstructive pulmonary disease (COPD) (HCC)    Glaucoma    Hyperlipidemia    Hypertension    Iron deficiency    Schizoaffective disorder (HCC)    Type 2 diabetes mellitus (HCC)     Past Surgical History:  Procedure Laterality Date   TUBAL LIGATION     Family History:  Family History  Problem Relation Age of Onset   Breast cancer Maternal Grandmother    Family Psychiatric  History: No history reported  Social History:  Social History   Substance and Sexual Activity  Alcohol Use Yes     Social History   Substance and Sexual Activity  Drug Use Not Currently    Social History   Socioeconomic History   Marital status: Divorced    Spouse name: Not on file   Number of children: Not on file   Years of education: Not on file   Highest education level: Not on file  Occupational History   Not on file  Tobacco Use   Smoking status: Every Day    Packs/day: 1.00    Types: Cigars, Cigarettes   Smokeless tobacco: Current  Vaping Use   Vaping Use: Never used  Substance and Sexual Activity   Alcohol use: Yes   Drug use: Not Currently   Sexual activity: Not Currently    Birth control/protection: Surgical  Other Topics Concern   Not on file  Social History Narrative  Not on file   Social Determinants of Health   Financial Resource Strain: Not on file  Food Insecurity: Not on file  Transportation Needs: Not on file  Physical Activity: Not on file  Stress: Not on file  Social Connections: Not on file   SDOH:  SDOH Screenings   Depression (PHQ2-9): Low Risk  (11/23/2021)  Tobacco Use: High Risk (12/30/2021)   Current Medications:   Current Facility-Administered Medications  Medication Dose Route Frequency Provider Last Rate Last Admin   acetaminophen (TYLENOL) tablet 650 mg  650 mg Oral Q6H PRN Rankin, Shuvon B, NP   650 mg at 01/01/22 2304   albuterol (VENTOLIN HFA) 108 (90 Base) MCG/ACT inhaler 2 puff  2 puff Inhalation Q6H PRN Rankin, Shuvon B, NP   2 puff at 12/09/21 2102   alum & mag hydroxide-simeth (MAALOX/MYLANTA) 200-200-20 MG/5ML suspension 30 mL  30 mL Oral Q4H PRN Rankin, Shuvon B, NP   30 mL at 12/19/21 2109   apixaban (ELIQUIS) tablet 5 mg  5 mg Oral BID Rankin, Shuvon B, NP   5 mg at 01/03/22 1018   cloZAPine (CLOZARIL) tablet 50 mg  50 mg Oral Daily [name], NP   50 mg at 01/03/22 1000   cloZAPine (CLOZARIL) tablet 75 mg  75 mg Oral QHS [name], NP   75 mg at 01/02/22 2109   diltiazem (CARDIZEM CD) 24 hr capsule 240 mg  240 mg Oral Daily Rankin, Shuvon B, NP   240 mg at 01/03/22 1000   divalproex (DEPAKOTE ER) 24 hr tablet 500 mg  500 mg Oral BID Rankin, Shuvon B, NP   500 mg at 01/03/22 1001   fluticasone furoate-vilanterol (BREO ELLIPTA) 200-25 MCG/ACT 1 puff  1 puff Inhalation Daily Rankin, Shuvon B, NP   1 puff at 01/03/22 0842   haloperidol (HALDOL) tablet 10 mg  10 mg Oral Q8H PRN Rankin, Shuvon B, NP       insulin aspart (novoLOG) injection 0-9 Units  0-9 Units Subcutaneous TID WC Tharon Aquas, NP   2 Units at 01/02/22 1706   insulin glargine-yfgn (SEMGLEE) injection 10 Units  10 Units Subcutaneous Daily [name], DO   10 Units at 01/03/22 1007   latanoprost (XALATAN) 0.005 % ophthalmic solution 1 drop  1 drop Both Eyes QHS Rankin, Shuvon B, NP   1 drop at 01/02/22 2109   magnesium hydroxide (MILK OF MAGNESIA) suspension 30 mL  30 mL Oral Daily PRN Rankin, Shuvon B, NP       menthol-cetylpyridinium (CEPACOL) lozenge 3 mg  1 lozenge Oral Once [name], NP       menthol-cetylpyridinium (CEPACOL) lozenge 3 mg  1 lozenge Oral PRN [name], NP   3 mg at 01/02/22 0127    metFORMIN (GLUCOPHAGE-XR) 24 hr tablet 1,000 mg  1,000 mg Oral BID WC [name], DO   1,000 mg at 01/03/22 0843   multivitamins with iron tablet 1 tablet  1 tablet Oral Daily [name], DO   1 tablet at 01/03/22 1000   nicotine (NICODERM CQ - dosed in mg/24 hr) patch 7 mg  7 mg Transdermal Daily Rankin, Shuvon B, NP   7 mg at 01/03/22 0959   polyethylene glycol (MIRALAX / GLYCOLAX) packet 17 g  17 g Oral Daily [name], DO   17 g at 01/03/22 1001   Vitamin D (Ergocalciferol) (DRISDOL) 1.25 MG (50000 UNIT) capsule 50,000 Units  50,000 Units Oral Q7 days Rankin, Shuvon B, NP  50,000 Units at 12/31/21 1019   Current Outpatient Medications  Medication Sig Dispense Refill   Accu-Chek Softclix Lancets lancets Use as directed up to four times daily 100 each 0   apixaban (ELIQUIS) 5 MG TABS tablet Take 1 tablet (5 mg total) by mouth 2 (two) times daily. 60 tablet 0   ARIPiprazole (ABILIFY) 10 MG tablet Take 1 tablet (10 mg total) by mouth daily. 30 tablet 0   Blood Glucose Monitoring Suppl (ACCU-CHEK GUIDE) w/Device KIT Use as directed up to four times daily 1 kit 0   budesonide-formoterol (SYMBICORT) 160-4.5 MCG/ACT inhaler Inhale 2 puffs into the lungs in the morning and at bedtime.     Cholecalciferol (VITAMIN D3) 1.25 MG (50000 UT) CAPS Take 50,000 Units by mouth every Thursday.     cloZAPine (CLOZARIL) 25 MG tablet Take 3 tablets (75 mg total) by mouth at bedtime. 90 tablet 0   clozapine (CLOZARIL) 50 MG tablet Take 1 tablet (50 mg total) by mouth daily. 30 tablet 0   diltiazem (CARDIZEM CD) 240 MG 24 hr capsule Take 1 capsule (240 mg total) by mouth daily. (Patient not taking: Reported on 11/24/2021) 30 capsule 0   divalproex (DEPAKOTE ER) 500 MG 24 hr tablet Take 1 tablet (500 mg total) by mouth 2 (two) times daily. 60 tablet 0   glucose blood test strip Use as directed up to four times daily 50 each 0   haloperidol (HALDOL) 10 MG tablet Take 1 tablet (10 mg total) by mouth 3 (three)  times daily as needed for agitation (and psychotic symptoms).     INGREZZA 40 MG capsule Take 1 capsule (40 mg total) by mouth in the morning. 30 capsule 0   insulin aspart (NOVOLOG) 100 UNIT/ML FlexPen Before each meal 3 times a day, 140-199 - 2 units, 200-250 - 4 units, 251-299 - 6 units,  300-349 - 8 units,  350 or above 10 units. Insulin PEN if approved, provide syringes and needles if needed.Please switch to any approved short acting Insulin if needed. 15 mL 0   insulin glargine (LANTUS) 100 UNIT/ML Solostar Pen Inject 12 Units into the skin daily. 15 mL 0   Insulin Pen Needle 32G X 4 MM MISC Use 4 times a day with insulin, 1 month supply. 100 each 0   latanoprost (XALATAN) 0.005 % ophthalmic solution Place 1 drop into both eyes at bedtime.     metFORMIN (GLUCOPHAGE) 500 MG tablet Take 500 mg by mouth 2 (two) times daily with a meal.     nicotine (NICODERM CQ - DOSED IN MG/24 HR) 7 mg/24hr patch Place 1 patch (7 mg total) onto the skin daily. 28 patch 0   PROAIR HFA 108 (90 Base) MCG/ACT inhaler Inhale 2 puffs into the lungs every 6 (six) hours as needed for wheezing or shortness of breath.      Labs  Lab Results:  No results displayed because visit has over 200 results.    Admission on 11/22/2021, Discharged on 11/22/2021  Component Date Value Ref Range Status   Glucose-Capillary 11/22/2021 169 (H)  70 - 99 mg/dL Final   Glucose reference range applies only to samples taken after fasting for at least 8 hours.  No results displayed because visit has over 200 results.    Admission on 10/04/2021, Discharged on 10/22/2021  Component Date Value Ref Range Status   SARS Coronavirus 2 by RT PCR 10/04/2021 NEGATIVE  NEGATIVE Final   Comment: (NOTE) SARS-CoV-2 target nucleic acids are NOT  DETECTED.  The SARS-CoV-2 RNA is generally detectable in upper respiratory specimens during the acute phase of infection. The lowest concentration of SARS-CoV-2 viral copies this assay can detect is 138  copies/mL. A negative result does not preclude SARS-Cov-2 infection and should not be used as the sole basis for treatment or other patient management decisions. A negative result may occur with  improper specimen collection/handling, submission of specimen other than nasopharyngeal swab, presence of viral mutation(s) within the areas targeted by this assay, and inadequate number of viral copies(<138 copies/mL). A negative result must be combined with clinical observations, patient history, and epidemiological information. The expected result is Negative.  Fact Sheet for Patients:  EntrepreneurPulse.com.au  Fact Sheet for Healthcare Providers:  IncredibleEmployment.be  This test is no                          t yet approved or cleared by the [place] FDA and  has been authorized for detection and/or diagnosis of SARS-CoV-2 by FDA under an Emergency Use Authorization (EUA). This EUA will remain  in effect (meaning this test can be used) for the duration of the COVID-19 declaration under Section 564(b)(1) of the Act, 21 U.S.C.section 360bbb-3(b)(1), unless the authorization is terminated  or revoked sooner.       Influenza A by PCR 10/04/2021 NEGATIVE  NEGATIVE Final   Influenza B by PCR 10/04/2021 NEGATIVE  NEGATIVE Final   Comment: (NOTE) The Xpert Xpress SARS-CoV-2/FLU/RSV plus assay is intended as an aid in the diagnosis of influenza from Nasopharyngeal swab specimens and should not be used as a sole basis for treatment. Nasal washings and aspirates are unacceptable for Xpert Xpress SARS-CoV-2/FLU/RSV testing.  Fact Sheet for Patients: EntrepreneurPulse.com.au  Fact Sheet for Healthcare Providers: IncredibleEmployment.be  This test is not yet approved or cleared by the [place] FDA and has been authorized for detection and/or diagnosis of SARS-CoV-2 by FDA under an Emergency Use Authorization  (EUA). This EUA will remain in effect (meaning this test can be used) for the duration of the COVID-19 declaration under Section 564(b)(1) of the Act, 21 U.S.C. section 360bbb-3(b)(1), unless the authorization is terminated or revoked.  Performed at [facility], [place] [address], [place], [place] [address]    WBC 10/04/2021 8.4  4.0 - 10.5 K/uL Final   RBC 10/04/2021 4.42  3.87 - 5.11 MIL/uL Final   Hemoglobin 10/04/2021 11.6 (L)  12.0 - 15.0 g/dL Final   HCT 10/04/2021 35.7 (L)  36.0 - 46.0 % Final   MCV 10/04/2021 80.8  80.0 - 100.0 fL Final   MCH 10/04/2021 26.2  26.0 - 34.0 pg Final   MCHC 10/04/2021 32.5  30.0 - 36.0 g/dL Final   RDW 10/04/2021 16.0 (H)  11.5 - 15.5 % Final   Platelets 10/04/2021 372  150 - 400 K/uL Final   nRBC 10/04/2021 0.0  0.0 - 0.2 % Final   Neutrophils Relative % 10/04/2021 68  % Final   Neutro Abs 10/04/2021 5.7  1.7 - 7.7 K/uL Final   Lymphocytes Relative 10/04/2021 22  % Final   Lymphs Abs 10/04/2021 1.8  0.7 - 4.0 K/uL Final   Monocytes Relative 10/04/2021 8  % Final   Monocytes Absolute 10/04/2021 0.7  0.1 - 1.0 K/uL Final   Eosinophils Relative 10/04/2021 1  % Final   Eosinophils Absolute 10/04/2021 0.1  0.0 - 0.5 K/uL Final   Basophils Relative 10/04/2021 1  % Final  Basophils Absolute 10/04/2021 0.1  0.0 - 0.1 K/uL Final   Immature Granulocytes 10/04/2021 0  % Final   Abs Immature Granulocytes 10/04/2021 0.03  0.00 - 0.07 K/uL Final   Performed at [facility] [address], [place], [place] [address]   Sodium 10/04/2021 136  135 - 145 mmol/L Final   Potassium 10/04/2021 4.2  3.5 - 5.1 mmol/L Final   Chloride 10/04/2021 104  98 - 111 mmol/L Final   CO2 10/04/2021 25  22 - 32 mmol/L Final   Glucose, Bld 10/04/2021 100 (H)  70 - 99 mg/dL Final   Glucose reference range applies only to samples taken after fasting for at least 8 hours.   BUN 10/04/2021 9  6 - 20 mg/dL Final   Creatinine, Ser 10/04/2021 0.52  0.44 - 1.00 mg/dL Final    Calcium 10/04/2021 9.0  8.9 - 10.3 mg/dL Final   Total Protein 10/04/2021 7.0  6.5 - 8.1 g/dL Final   Albumin 10/04/2021 3.2 (L)  3.5 - 5.0 g/dL Final   AST 10/04/2021 13 (L)  15 - 41 U/L Final   ALT 10/04/2021 10  0 - 44 U/L Final   Alkaline Phosphatase 10/04/2021 61  38 - 126 U/L Final   Total Bilirubin 10/04/2021 0.3  0.3 - 1.2 mg/dL Final   GFR, Estimated 10/04/2021 >60  >60 mL/min Final   Comment: (NOTE) Calculated using the CKD-EPI Creatinine Equation (2021)    Anion gap 10/04/2021 7  5 - 15 Final   Performed at [facility] [address], [place], [place] [address]   Hgb A1c MFr Bld 10/04/2021 7.0 (H)  4.8 - 5.6 % Final   Comment: (NOTE) Pre diabetes:          5.7%-6.4%  Diabetes:              >6.4%  Glycemic control for   <7.0% adults with diabetes    Mean Plasma Glucose 10/04/2021 154.2  mg/dL Final   Performed at [facility], [place] [address] [place], [place] [address]   Cholesterol 10/04/2021 178  0 - 200 mg/dL Final   Triglycerides 10/04/2021 155 (H)  <150 mg/dL Final   HDL 10/04/2021 52  >40 mg/dL Final   Total CHOL/HDL Ratio 10/04/2021 3.4  RATIO Final   VLDL 10/04/2021 31  0 - 40 mg/dL Final   LDL Cholesterol 10/04/2021 95  0 - 99 mg/dL Final   Comment:        Total Cholesterol/HDL:CHD Risk Coronary Heart Disease Risk Table                     Men   Women  1/2 Average Risk   3.4   3.3  Average Risk       5.0   4.4  2 X Average Risk   9.6   7.1  3 X Average Risk  23.4   11.0        Use the calculated Patient Ratio above and the CHD Risk Table to determine the patient's CHD Risk.        ATP III CLASSIFICATION (LDL):  <100     mg/dL   Optimal  100-129  mg/dL   Near or Above                    Optimal  130-159  mg/dL   Borderline  160-189  mg/dL   High  >190  mg/dL   Very High Performed at [facility], [place] [address] [place], [place] [address]    POC Amphetamine UR 10/04/2021 None Detected  NONE DETECTED (Cut Off Level 1000  ng/mL) Final   POC Secobarbital (BAR) 10/04/2021 None Detected  NONE DETECTED (Cut Off Level 300 ng/mL) Final   POC Buprenorphine (BUP) 10/04/2021 None Detected  NONE DETECTED (Cut Off Level 10 ng/mL) Final   POC Oxazepam (BZO) 10/04/2021 None Detected  NONE DETECTED (Cut Off Level 300 ng/mL) Final   POC Cocaine UR 10/04/2021 None Detected  NONE DETECTED (Cut Off Level 300 ng/mL) Final   POC Methamphetamine UR 10/04/2021 None Detected  NONE DETECTED (Cut Off Level 1000 ng/mL) Final   POC Morphine 10/04/2021 None Detected  NONE DETECTED (Cut Off Level 300 ng/mL) Final   POC Methadone UR 10/04/2021 None Detected  NONE DETECTED (Cut Off Level 300 ng/mL) Final   POC Oxycodone UR 10/04/2021 Positive (A)  NONE DETECTED (Cut Off Level 100 ng/mL) Final   POC Marijuana UR 10/04/2021 None Detected  NONE DETECTED (Cut Off Level 50 ng/mL) Final   SARSCOV2ONAVIRUS 2 AG 10/04/2021 NEGATIVE  NEGATIVE Final   Comment: (NOTE) SARS-CoV-2 antigen NOT DETECTED.   Negative results are presumptive.  Negative results do not preclude SARS-CoV-2 infection and should not be used as the sole basis for treatment or other patient management decisions, including infection  control decisions, particularly in the presence of clinical signs and  symptoms consistent with COVID-19, or in those who have been in contact with the virus.  Negative results must be combined with clinical observations, patient history, and epidemiological information. The expected result is Negative.  Fact Sheet for Patients: HandmadeRecipes.com.cy  Fact Sheet for Healthcare Providers: FuneralLife.at  This test is not yet approved or cleared by the [place] FDA and  has been authorized for detection and/or diagnosis of SARS-CoV-2 by FDA under an Emergency Use Authorization (EUA).  This EUA will remain in effect (meaning this test can be used) for the duration of  the COV                           ID-19 declaration under Section 564(b)(1) of the Act, 21 U.S.C. section 360bbb-3(b)(1), unless the authorization is terminated or revoked sooner.     Valproic Acid Lvl 10/04/2021 51  50.0 - 100.0 ug/mL Final   Performed at [facility] [address], [place], [place] [address]   Valproic Acid Lvl 10/08/2021 57  50.0 - 100.0 ug/mL Final   Performed at [facility] [address] [place], [place] [address]   Glucose-Capillary 10/09/2021 123 (H)  70 - 99 mg/dL Final   Glucose reference range applies only to samples taken after fasting for at least 8 hours.  Admission on 09/09/2021, Discharged on 09/10/2021  Component Date Value Ref Range Status   Sodium 09/09/2021 134 (L)  135 - 145 mmol/L Final   Potassium 09/09/2021 4.3  3.5 - 5.1 mmol/L Final   Chloride 09/09/2021 99  98 - 111 mmol/L Final   CO2 09/09/2021 25  22 - 32 mmol/L Final   Glucose, Bld 09/09/2021 107 (H)  70 - 99 mg/dL Final   Glucose reference range applies only to samples taken after fasting for at least 8 hours.   BUN 09/09/2021 12  6 - 20 mg/dL Final   Creatinine, Ser 09/09/2021 0.74  0.44 - 1.00 mg/dL Final   Calcium 09/09/2021  9.0  8.9 - 10.3 mg/dL Final   Total Protein 09/09/2021 7.4  6.5 - 8.1 g/dL Final   Albumin 09/09/2021 3.1 (L)  3.5 - 5.0 g/dL Final   AST 09/09/2021 13 (L)  15 - 41 U/L Final   ALT 09/09/2021 13  0 - 44 U/L Final   Alkaline Phosphatase 09/09/2021 66  38 - 126 U/L Final   Total Bilirubin 09/09/2021 0.2 (L)  0.3 - 1.2 mg/dL Final   GFR, Estimated 09/09/2021 >60  >60 mL/min Final   Comment: (NOTE) Calculated using the CKD-EPI Creatinine Equation (2021)    Anion gap 09/09/2021 10  5 - 15 Final   Performed at [facility], [place] [address], [place], [place] [address]   WBC 09/09/2021 8.5  4.0 - 10.5 K/uL Final   RBC 09/09/2021 4.53  3.87 - 5.11 MIL/uL Final   Hemoglobin 09/09/2021 11.8 (L)  12.0 - 15.0 g/dL Final   HCT 09/09/2021 38.0  36.0 - 46.0 % Final   MCV 09/09/2021  83.9  80.0 - 100.0 fL Final   MCH 09/09/2021 26.0  26.0 - 34.0 pg Final   MCHC 09/09/2021 31.1  30.0 - 36.0 g/dL Final   RDW 09/09/2021 17.8 (H)  11.5 - 15.5 % Final   Platelets 09/09/2021 442 (H)  150 - 400 K/uL Final   nRBC 09/09/2021 0.0  0.0 - 0.2 % Final   Neutrophils Relative % 09/09/2021 70  % Final   Neutro Abs 09/09/2021 5.9  1.7 - 7.7 K/uL Final   Lymphocytes Relative 09/09/2021 20  % Final   Lymphs Abs 09/09/2021 1.7  0.7 - 4.0 K/uL Final   Monocytes Relative 09/09/2021 8  % Final   Monocytes Absolute 09/09/2021 0.7  0.1 - 1.0 K/uL Final   Eosinophils Relative 09/09/2021 1  % Final   Eosinophils Absolute 09/09/2021 0.1  0.0 - 0.5 K/uL Final   Basophils Relative 09/09/2021 1  % Final   Basophils Absolute 09/09/2021 0.1  0.0 - 0.1 K/uL Final   Immature Granulocytes 09/09/2021 0  % Final   Abs Immature Granulocytes 09/09/2021 0.03  0.00 - 0.07 K/uL Final   Performed at [facility], [place] [address] [place], [place] [address]   Ammonia 09/09/2021 37 (H)  9 - 35 umol/L Final   Comment: HEMOLYSIS AT THIS LEVEL MAY AFFECT RESULT Performed at [facility], [place] [address], [place], [place] [address]    Opiates 09/09/2021 NONE DETECTED  NONE DETECTED Final   Cocaine 09/09/2021 NONE DETECTED  NONE DETECTED Final   Benzodiazepines 09/09/2021 NONE DETECTED  NONE DETECTED Final   Amphetamines 09/09/2021 NONE DETECTED  NONE DETECTED Final   Tetrahydrocannabinol 09/09/2021 NONE DETECTED  NONE DETECTED Final   Barbiturates 09/09/2021 NONE DETECTED  NONE DETECTED Final   Comment: (NOTE) DRUG SCREEN FOR MEDICAL PURPOSES ONLY.  IF CONFIRMATION IS NEEDED FOR ANY PURPOSE, NOTIFY LAB WITHIN 5 DAYS.  LOWEST DETECTABLE LIMITS FOR URINE DRUG SCREEN Drug Class                     Cutoff (ng/mL) Amphetamine and metabolites    1000 Barbiturate and metabolites    200 Benzodiazepine                 428 Tricyclics and metabolites     300 Opiates and metabolites        300 Cocaine  and metabolites        300 THC  50 Performed at [facility], [place] [address], [place], [place] [address]    Alcohol, Ethyl (B) 09/09/2021 <10  <10 mg/dL Final   Comment: (NOTE) Lowest detectable limit for serum alcohol is 10 mg/dL.  For medical purposes only. Performed at [facility], [place] [address] [place], [place] [address]    Lipase 09/09/2021 33  11 - 51 U/L Final   Performed at [facility] [address] [place], [place] [address]   Color, Urine 09/09/2021 COLORLESS (A)  YELLOW Final   APPearance 09/09/2021 CLEAR  CLEAR Final   Specific Gravity, Urine 09/09/2021 1.002 (L)  1.005 - 1.030 Final   pH 09/09/2021 6.0  5.0 - 8.0 Final   Glucose, UA 09/09/2021 NEGATIVE  NEGATIVE mg/dL Final   Hgb urine dipstick 09/09/2021 NEGATIVE  NEGATIVE Final   Bilirubin Urine 09/09/2021 NEGATIVE  NEGATIVE Final   Ketones, ur 09/09/2021 NEGATIVE  NEGATIVE mg/dL Final   Protein, ur 09/09/2021 NEGATIVE  NEGATIVE mg/dL Final   Nitrite 09/09/2021 NEGATIVE  NEGATIVE Final   Leukocytes,Ua 09/09/2021 NEGATIVE  NEGATIVE Final   Performed at [facility] [address] [place], [place] [address]   Specimen Description 09/09/2021 URINE, CLEAN CATCH   Final   Special Requests 09/09/2021 NONE   Final   Culture 09/09/2021  (A)   Final                   Value:<10,000 COLONIES/mL INSIGNIFICANT GROWTH Performed at [facility], [place] [address], [place], [place] [address]    Report Status 09/09/2021 09/10/2021 FINAL   Final   D-Dimer, Quant 09/09/2021 0.92 (H)  0.00 - 0.50 ug/mL-FEU Final   Comment: (NOTE) At the manufacturer cut-off value of 0.5 g/mL FEU, this assay has a negative predictive value of 95-100%.This assay is intended for use in conjunction with a clinical pretest probability (PTP) assessment model to exclude pulmonary embolism (PE) and deep venous thrombosis (DVT) in outpatients suspected of PE or DVT. Results should be  correlated with clinical presentation. Performed at [facility], [place] [address], [place], [place] [address]    Troponin I (High Sensitivity) 09/09/2021 5  <18 ng/L Final   Comment: (NOTE) Elevated high sensitivity troponin I (hsTnI) values and significant  changes across serial measurements may suggest ACS but many other  chronic and acute conditions are known to elevate hsTnI results.  Refer to the "Links" section for chest pain algorithms and additional  guidance. Performed at [facility], [place] [address], [place], [place] [address]    BP 09/10/2021 135/83  mmHg Final   S' Lateral 09/10/2021 2.30  cm Final   Area-P 1/2 09/10/2021 5.58  cm2 Final    Blood Alcohol level:  Lab Results  Component Value Date   ETH <10 11/23/2021   ETH <10 12/87/8676    Metabolic Disorder Labs: Lab Results  Component Value Date   HGBA1C 6.7 (H) 12/28/2021   MPG 145.59 12/28/2021   MPG 154.2 10/04/2021   Lab Results  Component Value Date   PROLACTIN 3.8 (L) 11/23/2021   Lab Results  Component Value Date   CHOL 171 11/23/2021   TRIG 125 11/23/2021   HDL 65 11/23/2021   CHOLHDL 2.6 11/23/2021   VLDL 25 11/23/2021   LDLCALC 81 11/23/2021   LDLCALC 95 10/04/2021    Therapeutic Lab Levels: No results found for: "LITHIUM" Lab Results  Component Value Date   VALPROATE 33 (L)  12/28/2021   VALPROATE 54 12/03/2021   No results found for: "CBMZ"  Physical Findings   PHQ2-9    [place] ED from 11/23/2021 in [facility]  PHQ-2 Total Score 2  PHQ-9 Total Score 3      Flowsheet Row ED from 11/23/2021 in [facility] Most recent reading at 11/23/2021  6:44 PM ED from 11/22/2021 in [place] [facility] Most recent reading at 11/22/2021  8:28 PM ED from 11/22/2021 in [place] [facility] Most recent reading at 11/22/2021  1:24 AM  C-SSRS RISK CATEGORY No Risk No Risk No  Risk        Musculoskeletal  Strength & Muscle Tone: within normal limits Gait & Station: normal Patient leans: N/A  Psychiatric Specialty Exam  Presentation  General Appearance:  Appropriate for Environment  Eye Contact: Fair  Speech: Clear and Coherent  Speech Volume: Normal  Handedness: Right   Mood and Affect  Mood: Euthymic  Affect: Congruent   Thought Process  Thought Processes: Coherent  Descriptions of Associations:Intact  Orientation:Full (Time, Place and Person)  Thought Content:Logical  Diagnosis of Schizophrenia or Schizoaffective disorder in past: Yes  Duration of Psychotic Symptoms: No data recorded  Hallucinations:Hallucinations: None  Ideas of Reference:None  Suicidal Thoughts:Suicidal Thoughts: No  Homicidal Thoughts:Homicidal Thoughts: No   Sensorium  Memory: Immediate Fair; Recent Fair; Remote Fair  Judgment: Intact  Insight: Present   Executive Functions  Concentration: Fair  Attention Span: Fair  Recall: [employer] of Knowledge: Fair  Language: Fair   Psychomotor Activity  Psychomotor Activity: Psychomotor Activity: Normal   Assets  Assets: Communication Skills; Desire for Improvement   Sleep  Sleep: Sleep: Fair   Physical Exam  Physical Exam HENT:     Head: Normocephalic.     Nose: Nose normal.  Eyes:     Conjunctiva/sclera: Conjunctivae normal.  Cardiovascular:     Rate and Rhythm: Normal rate.  Pulmonary:     Effort: Pulmonary effort is normal.  Musculoskeletal:     Cervical back: Normal range of motion.  Neurological:     Mental Status: She is alert and oriented to person, place, and time.    Review of Systems  Constitutional: Negative.   HENT: Negative.    Eyes: Negative.   Respiratory: Negative.    Cardiovascular: Negative.   Gastrointestinal: Negative.   Genitourinary: Negative.   Musculoskeletal: Negative.   Neurological: Negative.   Endo/Heme/Allergies: Negative.     Blood pressure 115/66, pulse 86, temperature 98.3 F (36.8 C), temperature source Oral, resp. rate 18, SpO2 97 %. There is no height or weight on file to calculate BMI.  Treatment Plan Summary: Patient is currently boarding in the [facility] continuous assessment while she awaits discharge placement.   Disposition: Patient has been psychiatrically cleared and does not meet criteria for inpatient psychiatric treatment. She is unable to return to group home unless she has been assigned a guardian. She is unable to care for herself and has been boarding on the continuous assessment unit while awaiting DSS caseworker to find appropriate placement. Social work/TOC working with DSS to find placement. Per Boris Lown, LCSW, Per Kevin Fenton, [facility] social work supervisor, their attorney determined the patient has the capacity to make placement decisions and they will not be seeking guardianship.   No medications changes on 01/03/22,   apixaban  5 mg Oral BID   clozapine  50 mg Oral Daily   cloZAPine  75  mg Oral QHS   diltiazem  240 mg Oral Daily   divalproex  500 mg Oral BID   fluticasone furoate-vilanterol  1 puff Inhalation Daily   insulin aspart  0-9 Units Subcutaneous TID WC   insulin glargine-yfgn  10 Units Subcutaneous Daily   latanoprost  1 drop Both Eyes QHS   menthol-cetylpyridinium  1 lozenge Oral Once   metFORMIN  1,000 mg Oral BID WC   multivitamins with iron  1 tablet Oral Daily   nicotine  7 mg Transdermal Daily   polyethylene glycol  17 g Oral Daily   Vitamin D (Ergocalciferol)  50,000 Units Oral Q7 days   Labs:  CBG this am is 80  Per [place], [facility] Brief note: [place]   10/31 [place] = [address] monitoring frequency= weekly   Next ANC due 11/7  Vital signs are stable.   Marissa Calamity, NP 01/03/2022 12:30 PM

## 2022-01-03 NOTE — Progress Notes (Signed)
F.S was 100 this morning and 80 at lunch.  No sliding scale coverage needed.  Patient given lunch and is eating now.  She has adhered to suggestions to drink more water.  No distress or complaint.  Pleasant and cooperative.  Will continue to meet needs and observe for safety.

## 2022-01-03 NOTE — Progress Notes (Signed)
Patient awake on and off.  Lying in bed at this time.  No distressor complaint.  Will monitor.

## 2022-01-03 NOTE — ED Notes (Signed)
Pt A&O x 4 no distress noted, watching TV at present.  Monitoring for safety.

## 2022-01-04 DIAGNOSIS — F209 Schizophrenia, unspecified: Secondary | ICD-10-CM | POA: Diagnosis not present

## 2022-01-04 LAB — GLUCOSE, CAPILLARY
Glucose-Capillary: 108 mg/dL — ABNORMAL HIGH (ref 70–99)
Glucose-Capillary: 166 mg/dL — ABNORMAL HIGH (ref 70–99)
Glucose-Capillary: 124 mg/dL — ABNORMAL HIGH (ref 70–99)

## 2022-01-04 NOTE — ED Notes (Signed)
Pt A&O x 4, no distress noted, calm & cooperative.  Monitoring for safety. 

## 2022-01-04 NOTE — ED Notes (Signed)
Pt sleeping in recliner bed. No noted distress. Will continue to monitor for safety 

## 2022-01-04 NOTE — ED Notes (Addendum)
Pt sleeping in recliner bed. No resp distress noted. Will monitor for safety

## 2022-01-04 NOTE — ED Provider Notes (Signed)
Teresa Murillo was seen and evaluated by this provider.  Patient is currently waiting for placement.  She is denying suicidal or homicidal ideations.  Denies auditory visual hallucinations.  States " just waiting for able to tell me what I am going to go?"  She reports she has been taking her medication as indicated.  No documented behaviors while on the unit.  Staff to continue to monitor for safety.  Support, encouragement and reassurance was provided.  During evaluation Teresa Murillo is sitting in no acute distress. She is alert/oriented x 3; calm/cooperative; and mood congruent with affect. She is speaking in a clear tone at moderate volume, and normal pace; with good eye contact.Her thought process is coherent and relevant; There is no indication that she is currently responding to internal/external stimuli or experiencing delusional thought content; and she has denied suicidal/self-harm/homicidal ideation, psychosis, and paranoia.   Patient has remained calm throughout assessment and has answered questions appropriately.

## 2022-01-04 NOTE — Care Management (Addendum)
Care Management   Per [facility].  Due to [facility] receiving an increase in funding she will now have a new Transport planner.  The new Care Manager has not contacted the writer.  Writer left a message with the previous Care Manager regarding the patient placement at ([place]).   Writer contacted the owner Francesca Jewett [phone]) at [facility].  Per Bari Mantis  she is willing to accept the patient pending the submission of the paperwork from [employer] stating the increase of funding.

## 2022-01-04 NOTE — ED Notes (Signed)
Pt awake & resting at present, no distress noted.  Monitoring for safety. 

## 2022-01-04 NOTE — ED Notes (Signed)
Pt resting quietly, breathing is even and unlabored.  Pt denies SI, HI, pain and AVH.  Will continue to monitor for safety.  

## 2022-01-05 DIAGNOSIS — F209 Schizophrenia, unspecified: Secondary | ICD-10-CM | POA: Diagnosis not present

## 2022-01-05 LAB — CBC WITH DIFFERENTIAL/PLATELET
Abs Immature Granulocytes: 0.01 10*3/uL (ref 0.00–0.07)
Basophils Absolute: 0.1 10*3/uL (ref 0.0–0.1)
Basophils Relative: 1 %
Eosinophils Absolute: 0.1 10*3/uL (ref 0.0–0.5)
Eosinophils Relative: 3 %
HCT: 34.6 % — ABNORMAL LOW (ref 36.0–46.0)
Hemoglobin: 10.7 g/dL — ABNORMAL LOW (ref 12.0–15.0)
Immature Granulocytes: 0 %
Lymphocytes Relative: 39 %
Lymphs Abs: 1.8 10*3/uL (ref 0.7–4.0)
MCH: 24.8 pg — ABNORMAL LOW (ref 26.0–34.0)
MCHC: 30.9 g/dL (ref 30.0–36.0)
MCV: 80.1 fL (ref 80.0–100.0)
Monocytes Absolute: 0.5 10*3/uL (ref 0.1–1.0)
Monocytes Relative: 12 %
Neutro Abs: 2 10*3/uL (ref 1.7–7.7)
Neutrophils Relative %: 45 %
Platelets: 297 10*3/uL (ref 150–400)
RBC: 4.32 MIL/uL (ref 3.87–5.11)
RDW: 17.9 % — ABNORMAL HIGH (ref 11.5–15.5)
WBC: 4.5 10*3/uL (ref 4.0–10.5)
nRBC: 0 % (ref 0.0–0.2)

## 2022-01-05 LAB — GLUCOSE, CAPILLARY
Glucose-Capillary: 101 mg/dL — ABNORMAL HIGH (ref 70–99)
Glucose-Capillary: 118 mg/dL — ABNORMAL HIGH (ref 70–99)
Glucose-Capillary: 123 mg/dL — ABNORMAL HIGH (ref 70–99)
Glucose-Capillary: 160 mg/dL — ABNORMAL HIGH (ref 70–99)

## 2022-01-05 NOTE — Progress Notes (Signed)
Teresa Murillo is awake, pleasant and social with her peers.

## 2022-01-05 NOTE — ED Provider Notes (Signed)
Behavioral Health Progress Note  Date and Time: 01/05/2022 8:39 AM Name: Teresa Murillo MRN:  [id number]  Subjective:    Teresa Murillo is a 61 y.o. female with a past psychiatric history of schizophrenia, aggressive behavior, and possible intellectual disability presenting to [facility] on 11/23/21 voluntarily as a walk in via [facility] with complaints that she was locked out of the group home. Patient has been dismissed from group home due to multiple elopements. Pt had already been discharged from this facility but had been living there temporarily while DSS was looking for new placement. Pt is boarding at [facility].   AID to Capacity Evaluation (ACE) completed by Dr. Louis Meckel, Dr. Dwyane Dee on 12/11/2021.  Please see media tab for full details.    [name], PhD completed: Wechsler Adult Intelligence Scale-4, Teresa Murillo achieved a full-scale IQ score of 73 and a percentile rank of 4 placing her in the borderline range of intellectual functioning (12/14/2021) Please see consult note from [name], PhD on 12/14/2021  On reassessment, pt verbalizes she is "alright". She is a&ox3, in no acute distress, non-toxic appearing. Pt denies SI/VI/HI, AVH, paranoia. She reports she is waiting to hear about placement. Discussed social work continues to work on this. Pt verbalized understanding. She denies any psychiatric or physical complaints. There is no evidence of responding to internal stimuli, agitation, aggression or distractibility. Pt is calm, cooperative, pleasant. Pt remains psychiatrically cleared. Disposition is pending placement.  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 On the Wechsler Adult Intelligence Scale-4, Teresa Murillo achieved a full-scale IQ score of 73 and a percentile rank of 4 placing her in the borderline range of intellectual functioning.    Chronic obstructive pulmonary disease (COPD) (HCC)    Glaucoma    Hyperlipidemia    Hypertension    Iron deficiency    Schizoaffective disorder (HCC)    Type 2 diabetes mellitus (HCC)     Past Surgical History:  Procedure Laterality Date   TUBAL LIGATION     Family History:  Family History  Problem Relation Age of Onset   Breast cancer Maternal Grandmother    Family Psychiatric  History: No history reported Social History:  Social History   Substance and Sexual Activity  Alcohol Use Yes     Social History   Substance and Sexual Activity  Drug Use Not Currently    Social History   Socioeconomic History   Marital status: Divorced    Spouse name: Not on file   Number of children: Not on file   Years of education: Not on file   Highest education level: Not on file  Occupational History   Not on file  Tobacco Use   Smoking status: Every Day    Packs/day: 1.00    Types: Cigars, Cigarettes   Smokeless tobacco: Current  Vaping Use   Vaping Use: Never used  Substance and Sexual Activity   Alcohol use: Yes   Drug use: Not Currently   Sexual activity: Not Currently    Birth control/protection: Surgical  Other Topics Concern   Not on file  Social History Narrative   Not on file  Social Determinants of Health   Financial Resource Strain: Not on file  Food Insecurity: Not on file  Transportation Needs: Not on file  Physical Activity: Not on file  Stress: Not on file  Social Connections: Not on file   SDOH:  SDOH Screenings   Depression (PHQ2-9): Low Risk  (11/23/2021)  Tobacco Use: High Risk (12/30/2021)   Additional Social History:    Pain Medications: See MAR Prescriptions: See  MAR Over the Counter: See MAR History of alcohol / drug use?: No history of alcohol / drug abuse Longest period of sobriety (when/how long): N/A                    Sleep: Good  Appetite:  Good  Current Medications:  Current Facility-Administered Medications  Medication Dose Route Frequency Provider Last Rate Last Admin   acetaminophen (TYLENOL) tablet 650 mg  650 mg Oral Q6H PRN Rankin, Shuvon B, NP   650 mg at 01/05/22 0222   albuterol (VENTOLIN HFA) 108 (90 Base) MCG/ACT inhaler 2 puff  2 puff Inhalation Q6H PRN Rankin, Shuvon B, NP   2 puff at 12/09/21 2102   alum & mag hydroxide-simeth (MAALOX/MYLANTA) 200-200-20 MG/5ML suspension 30 mL  30 mL Oral Q4H PRN Rankin, Shuvon B, NP   30 mL at 12/19/21 2109   apixaban (ELIQUIS) tablet 5 mg  5 mg Oral BID Rankin, Shuvon B, NP   5 mg at 01/04/22 2113   cloZAPine (CLOZARIL) tablet 50 mg  50 mg Oral Daily [name], NP   50 mg at 01/04/22 0911   cloZAPine (CLOZARIL) tablet 75 mg  75 mg Oral Dorie Rank, NP   75 mg at 01/04/22 2113   diltiazem (CARDIZEM CD) 24 hr capsule 240 mg  240 mg Oral Daily Rankin, Shuvon B, NP   240 mg at 01/04/22 0910   divalproex (DEPAKOTE ER) 24 hr tablet 500 mg  500 mg Oral BID Rankin, Shuvon B, NP   500 mg at 01/04/22 2113   fluticasone furoate-vilanterol (BREO ELLIPTA) 200-25 MCG/ACT 1 puff  1 puff Inhalation Daily Rankin, Shuvon B, NP   1 puff at 01/04/22 0909   haloperidol (HALDOL) tablet 10 mg  10 mg Oral Q8H PRN Rankin, Shuvon B, NP       insulin aspart (novoLOG) injection 0-9 Units  0-9 Units Subcutaneous TID WC Tharon Aquas, NP   1 Units at 01/04/22 1637   insulin glargine-yfgn (SEMGLEE) injection 10 Units  10 Units Subcutaneous Daily [name], DO   10 Units at 01/04/22 0912   latanoprost (XALATAN) 0.005 % ophthalmic solution 1 drop  1 drop Both Eyes QHS Rankin, Shuvon B, NP   1 drop at 01/04/22 2113   magnesium hydroxide (MILK OF MAGNESIA) suspension 30 mL  30 mL Oral Daily PRN  Rankin, Shuvon B, NP       menthol-cetylpyridinium (CEPACOL) lozenge 3 mg  1 lozenge Oral Once [name], NP       menthol-cetylpyridinium (CEPACOL) lozenge 3 mg  1 lozenge Oral PRN [name], NP   3 mg at 01/04/22 2340   metFORMIN (GLUCOPHAGE-XR) 24 hr tablet 1,000 mg  1,000 mg Oral BID WC [name], DO   1,000 mg at 01/04/22 1637   multivitamins with iron tablet 1 tablet  1 tablet Oral Daily [name], DO   1 tablet at 01/04/22 0910   nicotine (NICODERM CQ - dosed in mg/24 hr) patch 7 mg  7 mg Transdermal Daily Rankin,  Shuvon B, NP   7 mg at 01/04/22 0913   polyethylene glycol (MIRALAX / GLYCOLAX) packet 17 g  17 g Oral Daily [name], DO   17 g at 01/04/22 0909   Vitamin D (Ergocalciferol) (DRISDOL) 1.25 MG (50000 UNIT) capsule 50,000 Units  50,000 Units Oral Q7 days Rankin, Shuvon B, NP   50,000 Units at 12/31/21 1019   Current Outpatient Medications  Medication Sig Dispense Refill   Accu-Chek Softclix Lancets lancets Use as directed up to four times daily 100 each 0   apixaban (ELIQUIS) 5 MG TABS tablet Take 1 tablet (5 mg total) by mouth 2 (two) times daily. 60 tablet 0   ARIPiprazole (ABILIFY) 10 MG tablet Take 1 tablet (10 mg total) by mouth daily. 30 tablet 0   Blood Glucose Monitoring Suppl (ACCU-CHEK GUIDE) w/Device KIT Use as directed up to four times daily 1 kit 0   budesonide-formoterol (SYMBICORT) 160-4.5 MCG/ACT inhaler Inhale 2 puffs into the lungs in the morning and at bedtime.     Cholecalciferol (VITAMIN D3) 1.25 MG (50000 UT) CAPS Take 50,000 Units by mouth every Thursday.     cloZAPine (CLOZARIL) 25 MG tablet Take 3 tablets (75 mg total) by mouth at bedtime. 90 tablet 0   clozapine (CLOZARIL) 50 MG tablet Take 1 tablet (50 mg total) by mouth daily. 30 tablet 0   diltiazem (CARDIZEM CD) 240 MG 24 hr capsule Take 1 capsule (240 mg total) by mouth daily. (Patient not taking: Reported on 11/24/2021) 30 capsule 0   divalproex (DEPAKOTE ER) 500 MG 24 hr tablet  Take 1 tablet (500 mg total) by mouth 2 (two) times daily. 60 tablet 0   glucose blood test strip Use as directed up to four times daily 50 each 0   haloperidol (HALDOL) 10 MG tablet Take 1 tablet (10 mg total) by mouth 3 (three) times daily as needed for agitation (and psychotic symptoms).     INGREZZA 40 MG capsule Take 1 capsule (40 mg total) by mouth in the morning. 30 capsule 0   insulin aspart (NOVOLOG) 100 UNIT/ML FlexPen Before each meal 3 times a day, 140-199 - 2 units, 200-250 - 4 units, 251-299 - 6 units,  300-349 - 8 units,  350 or above 10 units. Insulin PEN if approved, provide syringes and needles if needed.Please switch to any approved short acting Insulin if needed. 15 mL 0   insulin glargine (LANTUS) 100 UNIT/ML Solostar Pen Inject 12 Units into the skin daily. 15 mL 0   Insulin Pen Needle 32G X 4 MM MISC Use 4 times a day with insulin, 1 month supply. 100 each 0   latanoprost (XALATAN) 0.005 % ophthalmic solution Place 1 drop into both eyes at bedtime.     metFORMIN (GLUCOPHAGE) 500 MG tablet Take 500 mg by mouth 2 (two) times daily with a meal.     nicotine (NICODERM CQ - DOSED IN MG/24 HR) 7 mg/24hr patch Place 1 patch (7 mg total) onto the skin daily. 28 patch 0   PROAIR HFA 108 (90 Base) MCG/ACT inhaler Inhale 2 puffs into the lungs every 6 (six) hours as needed for wheezing or shortness of breath.      Labs  Lab Results:  No results displayed because visit has over 200 results.    Admission on 11/22/2021, Discharged on 11/22/2021  Component Date Value Ref Range Status   Glucose-Capillary 11/22/2021 169 (H)  70 - 99 mg/dL Final   Glucose reference range applies  only to samples taken after fasting for at least 8 hours.  No results displayed because visit has over 200 results.    Admission on 10/04/2021, Discharged on 10/22/2021  Component Date Value Ref Range Status   SARS Coronavirus 2 by RT PCR 10/04/2021 NEGATIVE  NEGATIVE Final   Comment: (NOTE) SARS-CoV-2 target  nucleic acids are NOT DETECTED.  The SARS-CoV-2 RNA is generally detectable in upper respiratory specimens during the acute phase of infection. The lowest concentration of SARS-CoV-2 viral copies this assay can detect is 138 copies/mL. A negative result does not preclude SARS-Cov-2 infection and should not be used as the sole basis for treatment or other patient management decisions. A negative result may occur with  improper specimen collection/handling, submission of specimen other than nasopharyngeal swab, presence of viral mutation(s) within the areas targeted by this assay, and inadequate number of viral copies(<138 copies/mL). A negative result must be combined with clinical observations, patient history, and epidemiological information. The expected result is Negative.  Fact Sheet for Patients:  EntrepreneurPulse.com.au  Fact Sheet for Healthcare Providers:  IncredibleEmployment.be  This test is no                          t yet approved or cleared by the [place] FDA and  has been authorized for detection and/or diagnosis of SARS-CoV-2 by FDA under an Emergency Use Authorization (EUA). This EUA will remain  in effect (meaning this test can be used) for the duration of the COVID-19 declaration under Section 564(b)(1) of the Act, 21 U.S.C.section 360bbb-3(b)(1), unless the authorization is terminated  or revoked sooner.       Influenza A by PCR 10/04/2021 NEGATIVE  NEGATIVE Final   Influenza B by PCR 10/04/2021 NEGATIVE  NEGATIVE Final   Comment: (NOTE) The Xpert Xpress SARS-CoV-2/FLU/RSV plus assay is intended as an aid in the diagnosis of influenza from Nasopharyngeal swab specimens and should not be used as a sole basis for treatment. Nasal washings and aspirates are unacceptable for Xpert Xpress SARS-CoV-2/FLU/RSV testing.  Fact Sheet for Patients: EntrepreneurPulse.com.au  Fact Sheet for Healthcare  Providers: IncredibleEmployment.be  This test is not yet approved or cleared by the [place] FDA and has been authorized for detection and/or diagnosis of SARS-CoV-2 by FDA under an Emergency Use Authorization (EUA). This EUA will remain in effect (meaning this test can be used) for the duration of the COVID-19 declaration under Section 564(b)(1) of the Act, 21 U.S.C. section 360bbb-3(b)(1), unless the authorization is terminated or revoked.  Performed at [facility], [place] [address] [place], [place], [place] [address]    WBC 10/04/2021 8.4  4.0 - 10.5 K/uL Final   RBC 10/04/2021 4.42  3.87 - 5.11 MIL/uL Final   Hemoglobin 10/04/2021 11.6 (L)  12.0 - 15.0 g/dL Final   HCT 10/04/2021 35.7 (L)  36.0 - 46.0 % Final   MCV 10/04/2021 80.8  80.0 - 100.0 fL Final   MCH 10/04/2021 26.2  26.0 - 34.0 pg Final   MCHC 10/04/2021 32.5  30.0 - 36.0 g/dL Final   RDW 10/04/2021 16.0 (H)  11.5 - 15.5 % Final   Platelets 10/04/2021 372  150 - 400 K/uL Final   nRBC 10/04/2021 0.0  0.0 - 0.2 % Final   Neutrophils Relative % 10/04/2021 68  % Final   Neutro Abs 10/04/2021 5.7  1.7 - 7.7 K/uL Final   Lymphocytes Relative 10/04/2021 22  % Final   Lymphs Abs  10/04/2021 1.8  0.7 - 4.0 K/uL Final   Monocytes Relative 10/04/2021 8  % Final   Monocytes Absolute 10/04/2021 0.7  0.1 - 1.0 K/uL Final   Eosinophils Relative 10/04/2021 1  % Final   Eosinophils Absolute 10/04/2021 0.1  0.0 - 0.5 K/uL Final   Basophils Relative 10/04/2021 1  % Final   Basophils Absolute 10/04/2021 0.1  0.0 - 0.1 K/uL Final   Immature Granulocytes 10/04/2021 0  % Final   Abs Immature Granulocytes 10/04/2021 0.03  0.00 - 0.07 K/uL Final   Performed at [facility], [place] [address], [place], [place] [address]   Sodium 10/04/2021 136  135 - 145 mmol/L Final   Potassium 10/04/2021 4.2  3.5 - 5.1 mmol/L Final   Chloride 10/04/2021 104  98 - 111 mmol/L Final   CO2 10/04/2021 25  22 - 32 mmol/L Final    Glucose, Bld 10/04/2021 100 (H)  70 - 99 mg/dL Final   Glucose reference range applies only to samples taken after fasting for at least 8 hours.   BUN 10/04/2021 9  6 - 20 mg/dL Final   Creatinine, Ser 10/04/2021 0.52  0.44 - 1.00 mg/dL Final   Calcium 10/04/2021 9.0  8.9 - 10.3 mg/dL Final   Total Protein 10/04/2021 7.0  6.5 - 8.1 g/dL Final   Albumin 10/04/2021 3.2 (L)  3.5 - 5.0 g/dL Final   AST 10/04/2021 13 (L)  15 - 41 U/L Final   ALT 10/04/2021 10  0 - 44 U/L Final   Alkaline Phosphatase 10/04/2021 61  38 - 126 U/L Final   Total Bilirubin 10/04/2021 0.3  0.3 - 1.2 mg/dL Final   GFR, Estimated 10/04/2021 >60  >60 mL/min Final   Comment: (NOTE) Calculated using the CKD-EPI Creatinine Equation (2021)    Anion gap 10/04/2021 7  5 - 15 Final   Performed at [facility] [address] [place], [place] [address]   Hgb A1c MFr Bld 10/04/2021 7.0 (H)  4.8 - 5.6 % Final   Comment: (NOTE) Pre diabetes:          5.7%-6.4%  Diabetes:              >6.4%  Glycemic control for   <7.0% adults with diabetes    Mean Plasma Glucose 10/04/2021 154.2  mg/dL Final   Performed at [facility], [place] [address], [place], [place] [address]   Cholesterol 10/04/2021 178  0 - 200 mg/dL Final   Triglycerides 10/04/2021 155 (H)  <150 mg/dL Final   HDL 10/04/2021 52  >40 mg/dL Final   Total CHOL/HDL Ratio 10/04/2021 3.4  RATIO Final   VLDL 10/04/2021 31  0 - 40 mg/dL Final   LDL Cholesterol 10/04/2021 95  0 - 99 mg/dL Final   Comment:        Total Cholesterol/HDL:CHD Risk Coronary Heart Disease Risk Table                     Men   Women  1/2 Average Risk   3.4   3.3  Average Risk       5.0   4.4  2 X Average Risk   9.6   7.1  3 X Average Risk  23.4   11.0        Use the calculated Patient Ratio above and the CHD Risk Table to determine the patient's CHD Risk.        ATP  III CLASSIFICATION (LDL):  <100     mg/dL   Optimal  100-129  mg/dL   Near or Above                     Optimal  130-159  mg/dL   Borderline  160-189  mg/dL   High  >190     mg/dL   Very High Performed at [facility] [address], [place], [place] [address]    POC Amphetamine UR 10/04/2021 None Detected  NONE DETECTED (Cut Off Level 1000 ng/mL) Final   POC Secobarbital (BAR) 10/04/2021 None Detected  NONE DETECTED (Cut Off Level 300 ng/mL) Final   POC Buprenorphine (BUP) 10/04/2021 None Detected  NONE DETECTED (Cut Off Level 10 ng/mL) Final   POC Oxazepam (BZO) 10/04/2021 None Detected  NONE DETECTED (Cut Off Level 300 ng/mL) Final   POC Cocaine UR 10/04/2021 None Detected  NONE DETECTED (Cut Off Level 300 ng/mL) Final   POC Methamphetamine UR 10/04/2021 None Detected  NONE DETECTED (Cut Off Level 1000 ng/mL) Final   POC Morphine 10/04/2021 None Detected  NONE DETECTED (Cut Off Level 300 ng/mL) Final   POC Methadone UR 10/04/2021 None Detected  NONE DETECTED (Cut Off Level 300 ng/mL) Final   POC Oxycodone UR 10/04/2021 Positive (A)  NONE DETECTED (Cut Off Level 100 ng/mL) Final   POC Marijuana UR 10/04/2021 None Detected  NONE DETECTED (Cut Off Level 50 ng/mL) Final   SARSCOV2ONAVIRUS 2 AG 10/04/2021 NEGATIVE  NEGATIVE Final   Comment: (NOTE) SARS-CoV-2 antigen NOT DETECTED.   Negative results are presumptive.  Negative results do not preclude SARS-CoV-2 infection and should not be used as the sole basis for treatment or other patient management decisions, including infection  control decisions, particularly in the presence of clinical signs and  symptoms consistent with COVID-19, or in those who have been in contact with the virus.  Negative results must be combined with clinical observations, patient history, and epidemiological information. The expected result is Negative.  Fact Sheet for Patients: HandmadeRecipes.com.cy  Fact Sheet for Healthcare Providers: FuneralLife.at  This test is not yet approved or cleared by the  [place] FDA and  has been authorized for detection and/or diagnosis of SARS-CoV-2 by FDA under an Emergency Use Authorization (EUA).  This EUA will remain in effect (meaning this test can be used) for the duration of  the COV                          ID-19 declaration under Section 564(b)(1) of the Act, 21 U.S.C. section 360bbb-3(b)(1), unless the authorization is terminated or revoked sooner.     Valproic Acid Lvl 10/04/2021 51  50.0 - 100.0 ug/mL Final   Performed at [facility] [address] [place], [place] [address]   Valproic Acid Lvl 10/08/2021 57  50.0 - 100.0 ug/mL Final   Performed at [facility] [address], [place], [place] [address]   Glucose-Capillary 10/09/2021 123 (H)  70 - 99 mg/dL Final   Glucose reference range applies only to samples taken after fasting for at least 8 hours.  Admission on 09/09/2021, Discharged on 09/10/2021  Component Date Value Ref Range Status   Sodium 09/09/2021 134 (L)  135 - 145 mmol/L Final   Potassium 09/09/2021 4.3  3.5 - 5.1 mmol/L Final   Chloride 09/09/2021 99  98 - 111 mmol/L Final   CO2 09/09/2021 25  22 - 32 mmol/L Final   Glucose, Bld 09/09/2021 107 (H)  70 - 99 mg/dL Final   Glucose reference range applies only to samples taken after fasting for at least 8 hours.   BUN 09/09/2021 12  6 - 20 mg/dL Final   Creatinine, Ser 09/09/2021 0.74  0.44 - 1.00 mg/dL Final   Calcium 09/09/2021 9.0  8.9 - 10.3 mg/dL Final   Total Protein 09/09/2021 7.4  6.5 - 8.1 g/dL Final   Albumin 09/09/2021 3.1 (L)  3.5 - 5.0 g/dL Final   AST 09/09/2021 13 (L)  15 - 41 U/L Final   ALT 09/09/2021 13  0 - 44 U/L Final   Alkaline Phosphatase 09/09/2021 66  38 - 126 U/L Final   Total Bilirubin 09/09/2021 0.2 (L)  0.3 - 1.2 mg/dL Final   GFR, Estimated 09/09/2021 >60  >60 mL/min Final   Comment: (NOTE) Calculated using the CKD-EPI Creatinine Equation (2021)    Anion gap 09/09/2021 10  5 - 15 Final   Performed at [facility], [place] [address] [place], [place] [address]   WBC 09/09/2021 8.5  4.0 - 10.5 K/uL Final   RBC 09/09/2021 4.53  3.87 - 5.11 MIL/uL Final   Hemoglobin 09/09/2021 11.8 (L)  12.0 - 15.0 g/dL Final   HCT 09/09/2021 38.0  36.0 - 46.0 % Final   MCV 09/09/2021 83.9  80.0 - 100.0 fL Final   MCH 09/09/2021 26.0  26.0 - 34.0 pg Final   MCHC 09/09/2021 31.1  30.0 - 36.0 g/dL Final   RDW 09/09/2021 17.8 (H)  11.5 - 15.5 % Final   Platelets 09/09/2021 442 (H)  150 - 400 K/uL Final   nRBC 09/09/2021 0.0  0.0 - 0.2 % Final   Neutrophils Relative % 09/09/2021 70  % Final   Neutro Abs 09/09/2021 5.9  1.7 - 7.7 K/uL Final   Lymphocytes Relative 09/09/2021 20  % Final   Lymphs Abs 09/09/2021 1.7  0.7 - 4.0 K/uL Final   Monocytes Relative 09/09/2021 8  % Final   Monocytes Absolute 09/09/2021 0.7  0.1 - 1.0 K/uL Final   Eosinophils Relative 09/09/2021 1  % Final   Eosinophils Absolute 09/09/2021 0.1  0.0 - 0.5 K/uL Final   Basophils Relative 09/09/2021 1  % Final   Basophils Absolute 09/09/2021 0.1  0.0 - 0.1 K/uL Final   Immature Granulocytes 09/09/2021 0  % Final   Abs Immature Granulocytes 09/09/2021 0.03  0.00 - 0.07 K/uL Final   Performed at [facility], [place] [address] [place], [place] [address]   Ammonia 09/09/2021 37 (H)  9 - 35 umol/L Final   Comment: HEMOLYSIS AT THIS LEVEL MAY AFFECT RESULT Performed at [facility], [place] [address], [place], [place] [address]    Opiates 09/09/2021 NONE DETECTED  NONE DETECTED Final   Cocaine 09/09/2021 NONE DETECTED  NONE DETECTED Final   Benzodiazepines 09/09/2021 NONE DETECTED  NONE DETECTED Final   Amphetamines 09/09/2021 NONE DETECTED  NONE DETECTED Final   Tetrahydrocannabinol 09/09/2021 NONE DETECTED  NONE DETECTED Final   Barbiturates 09/09/2021 NONE DETECTED  NONE DETECTED Final   Comment: (NOTE) DRUG SCREEN FOR MEDICAL PURPOSES ONLY.  IF CONFIRMATION IS NEEDED FOR ANY PURPOSE, NOTIFY LAB WITHIN 5 DAYS.  LOWEST DETECTABLE  LIMITS FOR URINE DRUG SCREEN Drug Class                     Cutoff (ng/mL) Amphetamine and  metabolites    1000 Barbiturate and metabolites    200 Benzodiazepine                 384 Tricyclics and metabolites     300 Opiates and metabolites        300 Cocaine and metabolites        300 THC                            50 Performed at [facility], [place] [address], [place], [place] [address]    Alcohol, Ethyl (B) 09/09/2021 <10  <10 mg/dL Final   Comment: (NOTE) Lowest detectable limit for serum alcohol is 10 mg/dL.  For medical purposes only. Performed at [facility], [place] [address], [place], [place] [address]    Lipase 09/09/2021 33  11 - 51 U/L Final   Performed at [facility] [address], [place], [place] [address]   Color, Urine 09/09/2021 COLORLESS (A)  YELLOW Final   APPearance 09/09/2021 CLEAR  CLEAR Final   Specific Gravity, Urine 09/09/2021 1.002 (L)  1.005 - 1.030 Final   pH 09/09/2021 6.0  5.0 - 8.0 Final   Glucose, UA 09/09/2021 NEGATIVE  NEGATIVE mg/dL Final   Hgb urine dipstick 09/09/2021 NEGATIVE  NEGATIVE Final   Bilirubin Urine 09/09/2021 NEGATIVE  NEGATIVE Final   Ketones, ur 09/09/2021 NEGATIVE  NEGATIVE mg/dL Final   Protein, ur 09/09/2021 NEGATIVE  NEGATIVE mg/dL Final   Nitrite 09/09/2021 NEGATIVE  NEGATIVE Final   Leukocytes,Ua 09/09/2021 NEGATIVE  NEGATIVE Final   Performed at [facility] [address] [place], [place] [address]   Specimen Description 09/09/2021 URINE, CLEAN CATCH   Final   Special Requests 09/09/2021 NONE   Final   Culture 09/09/2021  (A)   Final                   Value:<10,000 COLONIES/mL INSIGNIFICANT GROWTH Performed at [facility], [place] [address], [place], [place] [address]    Report Status 09/09/2021 09/10/2021 FINAL   Final   D-Dimer, Quant 09/09/2021 0.92 (H)  0.00 - 0.50 ug/mL-FEU Final   Comment: (NOTE) At the manufacturer cut-off value of 0.5 g/mL FEU, this assay has a negative  predictive value of 95-100%.This assay is intended for use in conjunction with a clinical pretest probability (PTP) assessment model to exclude pulmonary embolism (PE) and deep venous thrombosis (DVT) in outpatients suspected of PE or DVT. Results should be correlated with clinical presentation. Performed at [facility], [place] [address], [place], [place] [address]    Troponin I (High Sensitivity) 09/09/2021 5  <18 ng/L Final   Comment: (NOTE) Elevated high sensitivity troponin I (hsTnI) values and significant  changes across serial measurements may suggest ACS but many other  chronic and acute conditions are known to elevate hsTnI results.  Refer to the "Links" section for chest pain algorithms and additional  guidance. Performed at [facility], [place] [address], [place], [place] [address]    BP 09/10/2021 135/83  mmHg Final   S' Lateral 09/10/2021 2.30  cm Final   Area-P 1/2 09/10/2021 5.58  cm2 Final    Blood Alcohol level:  Lab Results  Component Value Date   ETH <10 11/23/2021   ETH <10 56/25/6389    Metabolic Disorder Labs: Lab Results  Component Value Date   HGBA1C 6.7 (H) 12/28/2021  MPG 145.59 12/28/2021   MPG 154.2 10/04/2021   Lab Results  Component Value Date   PROLACTIN 3.8 (L) 11/23/2021   Lab Results  Component Value Date   CHOL 171 11/23/2021   TRIG 125 11/23/2021   HDL 65 11/23/2021   CHOLHDL 2.6 11/23/2021   VLDL 25 11/23/2021   LDLCALC 81 11/23/2021   LDLCALC 95 10/04/2021    Therapeutic Lab Levels: No results found for: "LITHIUM" Lab Results  Component Value Date   VALPROATE 33 (L) 12/28/2021   VALPROATE 54 12/03/2021   No results found for: "CBMZ"  Physical Findings   PHQ2-9    [place] ED from 11/23/2021 in [facility]  PHQ-2 Total Score 2  PHQ-9 Total Score 3      Flowsheet Row ED from 11/23/2021 in [facility] Most recent reading at 11/23/2021  6:44 PM  ED from 11/22/2021 in [place] DE[facility] Most recent reading at 11/22/2021  8:28 PM ED from 11/22/2021 in [place] DE[facility] Most recent reading at 11/22/2021  1:24 AM  C-SSRS RISK CATEGORY No Risk No Risk No Risk        Musculoskeletal  Strength & Muscle Tone: within normal limits Gait & Station: normal Patient leans: N/A  Psychiatric Specialty Exam  Presentation  General Appearance:  Appropriate for Environment  Eye Contact: Fair  Speech: Clear and Coherent  Speech Volume: Normal  Handedness: Right   Mood and Affect  Mood: Euthymic  Affect: Flat   Thought Process  Thought Processes: Coherent  Descriptions of Associations:Intact  Orientation:Full (Time, Place and Person)  Thought Content:WDL  Diagnosis of Schizophrenia or Schizoaffective disorder in past: Yes  Duration of Psychotic Symptoms: No data recorded  Hallucinations:Hallucinations: None  Ideas of Reference:None  Suicidal Thoughts:Suicidal Thoughts: No  Homicidal Thoughts:Homicidal Thoughts: No   Sensorium  Memory: Immediate Fair  Judgment: Intact  Insight: Present   Executive Functions  Concentration: Fair  Attention Span: Fair  Recall: [place] of Knowledge: Fair  Language: Fair   Psychomotor Activity  Psychomotor Activity:Psychomotor Activity: Normal   Assets  Assets: Communication Skills; Desire for Improvement   Sleep  Sleep:Sleep: Fair   No data recorded  Physical Exam  Physical Exam Constitutional:      General: She is not in acute distress.    Appearance: She is not ill-appearing, toxic-appearing or diaphoretic.  Eyes:     General: No scleral icterus. Cardiovascular:     Rate and Rhythm: Normal rate.  Pulmonary:     Effort: Pulmonary effort is normal. No respiratory distress.  Neurological:     Mental Status: She is alert and oriented to person, place, and time.  Psychiatric:         Attention and Perception: Attention and perception normal.        Mood and Affect: Mood normal. Affect is flat.        Speech: Speech normal.        Behavior: Behavior normal. Behavior is cooperative.        Thought Content: Thought content normal.    Review of Systems  Constitutional:  Negative for chills and fever.  Respiratory:  Negative for shortness of breath.   Cardiovascular:  Negative for chest pain and palpitations.  Gastrointestinal:  Negative for abdominal pain.  Neurological:  Negative for headaches.  Psychiatric/Behavioral: Negative.     Blood pressure 119/78, pulse (!) 104, temperature 97.6 F (36.4 C), temperature source Oral, resp. rate  16, SpO2 100 %. There is no height or weight on file to calculate BMI.  Treatment Plan Summary: Plan Disposition is pending placement  Tharon Aquas, NP 01/05/2022 8:39 AM

## 2022-01-05 NOTE — ED Notes (Signed)
Pt sleeping at present, no distress noted.  Monitoring for safety. 

## 2022-01-05 NOTE — Progress Notes (Signed)
Brief note: [facility]  11/7 [place] = [address] [place] monitoring frequency= weekly  Next [place] due 11/14  Thanks!  [name] 01/05/2022 1:48 PM

## 2022-01-05 NOTE — Care Management (Signed)
Care Management   Writer contacted the previous [place] on the phone and was able to confirm that the phone number is [phone] for the new Care Manager.    I left a voicemail message and an email regarding the placement of [facility] to [facility] with the new Care Manager Verner Mould

## 2022-01-05 NOTE — Progress Notes (Signed)
Received Teresa Murillo this Am asleep in her chair bed. She woke up on her own, received breakfast and returned to bed and was awaken for medication administration. She continued to sleep throughout the morning and waking up for snacks and lunch. No complaints voiced.

## 2022-01-05 NOTE — ED Notes (Signed)
Pt A&O x 4, no distress noted., calm & cooperative, monitoring for safety. 

## 2022-01-05 NOTE — Care Management (Signed)
Care Management   Writer received a return phone call from the new Care Manager with [facility] [place] [phone]).   [facility] reports that she is going to contact [facility] to determine what the requested  accommodations are being requested by the Group Home.  As well as the additional funding amount that is needed to secure the placement for the patient and then she will follow up with me to provide me with update on the status of placement for the patient.

## 2022-01-06 DIAGNOSIS — F209 Schizophrenia, unspecified: Secondary | ICD-10-CM | POA: Diagnosis not present

## 2022-01-06 LAB — GLUCOSE, CAPILLARY
Glucose-Capillary: 104 mg/dL — ABNORMAL HIGH (ref 70–99)
Glucose-Capillary: 131 mg/dL — ABNORMAL HIGH (ref 70–99)
Glucose-Capillary: 176 mg/dL — ABNORMAL HIGH (ref 70–99)
Glucose-Capillary: 161 mg/dL — ABNORMAL HIGH (ref 70–99)

## 2022-01-06 NOTE — ED Notes (Signed)
Pt resting quietly, breathing is even and unlabored.  Pt denies SI, HI, pain and AVH.  Will continue to monitor for safety.  

## 2022-01-06 NOTE — ED Notes (Signed)
Patient watching TV - denies SI, HI AVH - will continue to monitor for safety

## 2022-01-06 NOTE — ED Notes (Signed)
Will continue to monitor for safety

## 2022-01-06 NOTE — ED Provider Notes (Signed)
Behavioral Health Progress Note  Date and Time: 01/06/2022 8:33 AM Name: Teresa Murillo MRN:  [id number]  Subjective:    Teresa Murillo is a 61 y.o. female with a past psychiatric history of schizophrenia, aggressive behavior, and possible intellectual disability presenting to [facility] on 11/23/21 voluntarily as a walk in via [facility] with complaints that she was locked out of the group home. Patient has been dismissed from group home due to multiple elopements. Pt had already been discharged from this facility but had been living there temporarily while DSS was looking for new placement. Pt is boarding at [facility].   AID to Capacity Evaluation (ACE) completed by Dr. Louis Meckel, Dr. Dwyane Dee on 12/11/2021.  Please see media tab for full details.    [name], PhD completed: Wechsler Adult Intelligence Scale-4, [name] achieved a full-scale IQ score of 73 and a percentile rank of 4 placing her in the borderline range of intellectual functioning (12/14/2021) Please see consult note from [name], PhD on 12/14/2021   On reassessment, pt is sleeping, wakens easily to name being called. She denies SI/VI/HI, AVH, paranoia. She states she does not know what her plans for the day are. Pt continues to be psychiatrically cleared. Disposition is pending placement.  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 On the Wechsler Adult Intelligence Scale-4, [name] achieved a full-scale IQ score of 73 and a percentile rank of 4 placing her in the borderline range of intellectual functioning.    Chronic obstructive pulmonary  disease (COPD) (HCC)    Glaucoma    Hyperlipidemia    Hypertension    Iron deficiency    Schizoaffective disorder (HCC)    Type 2 diabetes mellitus (HCC)     Past Surgical History:  Procedure Laterality Date   TUBAL LIGATION     Family History:  Family History  Problem Relation Age of Onset   Breast cancer Maternal Grandmother    Family Psychiatric  History: None reported Social History:  Social History   Substance and Sexual Activity  Alcohol Use Yes     Social History   Substance and Sexual Activity  Drug Use Not Currently    Social History   Socioeconomic History   Marital status: Divorced    Spouse name: Not on file   Number of children: Not on file   Years of education: Not on file   Highest education level: Not on file  Occupational History   Not on file  Tobacco Use   Smoking status: Every Day    Packs/day: 1.00    Types: Cigars, Cigarettes   Smokeless tobacco: Current  Vaping Use   Vaping Use: Never used  Substance and Sexual Activity   Alcohol use: Yes   Drug use: Not Currently   Sexual activity: Not Currently    Birth control/protection: Surgical  Other Topics Concern   Not on file  Social History Narrative   Not on file   Social Determinants of Health   Financial Resource Strain: Not on file  Food Insecurity: Not on file  Transportation Needs: Not on file  Physical Activity: Not on file  Stress: Not on file  Social Connections:  Not on file   SDOH:  SDOH Screenings   Depression (PHQ2-9): Low Risk  (11/23/2021)  Tobacco Use: High Risk (12/30/2021)   Additional Social History:    Pain Medications: See MAR Prescriptions: See MAR Over the Counter: See MAR History of alcohol / drug use?: No history of alcohol / drug abuse Longest period of sobriety (when/how long): N/A                    Sleep: Good  Appetite:  Good  Current Medications:  Current Facility-Administered Medications  Medication Dose Route Frequency Provider  Last Rate Last Admin   acetaminophen (TYLENOL) tablet 650 mg  650 mg Oral Q6H PRN Rankin, Shuvon B, NP   650 mg at 01/05/22 0222   albuterol (VENTOLIN HFA) 108 (90 Base) MCG/ACT inhaler 2 puff  2 puff Inhalation Q6H PRN Rankin, Shuvon B, NP   2 puff at 12/09/21 2102   alum & mag hydroxide-simeth (MAALOX/MYLANTA) 200-200-20 MG/5ML suspension 30 mL  30 mL Oral Q4H PRN Rankin, Shuvon B, NP   30 mL at 12/19/21 2109   apixaban (ELIQUIS) tablet 5 mg  5 mg Oral BID Rankin, Shuvon B, NP   5 mg at 01/05/22 2115   cloZAPine (CLOZARIL) tablet 50 mg  50 mg Oral Daily [name], NP   50 mg at 01/05/22 0950   cloZAPine (CLOZARIL) tablet 75 mg  75 mg Oral Dorie Rank, NP   75 mg at 01/05/22 2115   diltiazem (CARDIZEM CD) 24 hr capsule 240 mg  240 mg Oral Daily Rankin, Shuvon B, NP   240 mg at 01/05/22 0951   divalproex (DEPAKOTE ER) 24 hr tablet 500 mg  500 mg Oral BID Rankin, Shuvon B, NP   500 mg at 01/05/22 2115   fluticasone furoate-vilanterol (BREO ELLIPTA) 200-25 MCG/ACT 1 puff  1 puff Inhalation Daily Rankin, Shuvon B, NP   1 puff at 01/05/22 0847   haloperidol (HALDOL) tablet 10 mg  10 mg Oral Q8H PRN Rankin, Shuvon B, NP       insulin aspart (novoLOG) injection 0-9 Units  0-9 Units Subcutaneous TID WC Tharon Aquas, NP   1 Units at 01/05/22 1847   insulin glargine-yfgn (SEMGLEE) injection 10 Units  10 Units Subcutaneous Daily [name], DO   10 Units at 01/05/22 1011   latanoprost (XALATAN) 0.005 % ophthalmic solution 1 drop  1 drop Both Eyes QHS Rankin, Shuvon B, NP   1 drop at 01/05/22 2123   magnesium hydroxide (MILK OF MAGNESIA) suspension 30 mL  30 mL Oral Daily PRN Rankin, Shuvon B, NP       menthol-cetylpyridinium (CEPACOL) lozenge 3 mg  1 lozenge Oral Once [name], NP       menthol-cetylpyridinium (CEPACOL) lozenge 3 mg  1 lozenge Oral PRN [name], NP   3 mg at 01/04/22 2340   metFORMIN (GLUCOPHAGE-XR) 24 hr tablet 1,000 mg  1,000 mg Oral BID WC [name], DO    1,000 mg at 01/05/22 1847   multivitamins with iron tablet 1 tablet  1 tablet Oral Daily [name], DO   1 tablet at 01/05/22 0952   nicotine (NICODERM CQ - dosed in mg/24 hr) patch 7 mg  7 mg Transdermal Daily Rankin, Shuvon B, NP   7 mg at 01/05/22 0952   polyethylene glycol (MIRALAX / GLYCOLAX) packet 17 g  17 g Oral Daily [name], DO   17 g at 01/05/22 1034   Vitamin  D (Ergocalciferol) (DRISDOL) 1.25 MG (50000 UNIT) capsule 50,000 Units  50,000 Units Oral Q7 days Rankin, Shuvon B, NP   50,000 Units at 12/31/21 1019   Current Outpatient Medications  Medication Sig Dispense Refill   Accu-Chek Softclix Lancets lancets Use as directed up to four times daily 100 each 0   apixaban (ELIQUIS) 5 MG TABS tablet Take 1 tablet (5 mg total) by mouth 2 (two) times daily. 60 tablet 0   ARIPiprazole (ABILIFY) 10 MG tablet Take 1 tablet (10 mg total) by mouth daily. 30 tablet 0   Blood Glucose Monitoring Suppl (ACCU-CHEK GUIDE) w/Device KIT Use as directed up to four times daily 1 kit 0   budesonide-formoterol (SYMBICORT) 160-4.5 MCG/ACT inhaler Inhale 2 puffs into the lungs in the morning and at bedtime.     Cholecalciferol (VITAMIN D3) 1.25 MG (50000 UT) CAPS Take 50,000 Units by mouth every Thursday.     cloZAPine (CLOZARIL) 25 MG tablet Take 3 tablets (75 mg total) by mouth at bedtime. 90 tablet 0   clozapine (CLOZARIL) 50 MG tablet Take 1 tablet (50 mg total) by mouth daily. 30 tablet 0   diltiazem (CARDIZEM CD) 240 MG 24 hr capsule Take 1 capsule (240 mg total) by mouth daily. (Patient not taking: Reported on 11/24/2021) 30 capsule 0   divalproex (DEPAKOTE ER) 500 MG 24 hr tablet Take 1 tablet (500 mg total) by mouth 2 (two) times daily. 60 tablet 0   glucose blood test strip Use as directed up to four times daily 50 each 0   haloperidol (HALDOL) 10 MG tablet Take 1 tablet (10 mg total) by mouth 3 (three) times daily as needed for agitation (and psychotic symptoms).     INGREZZA 40 MG capsule  Take 1 capsule (40 mg total) by mouth in the morning. 30 capsule 0   insulin aspart (NOVOLOG) 100 UNIT/ML FlexPen Before each meal 3 times a day, 140-199 - 2 units, 200-250 - 4 units, 251-299 - 6 units,  300-349 - 8 units,  350 or above 10 units. Insulin PEN if approved, provide syringes and needles if needed.Please switch to any approved short acting Insulin if needed. 15 mL 0   insulin glargine (LANTUS) 100 UNIT/ML Solostar Pen Inject 12 Units into the skin daily. 15 mL 0   Insulin Pen Needle 32G X 4 MM MISC Use 4 times a day with insulin, 1 month supply. 100 each 0   latanoprost (XALATAN) 0.005 % ophthalmic solution Place 1 drop into both eyes at bedtime.     metFORMIN (GLUCOPHAGE) 500 MG tablet Take 500 mg by mouth 2 (two) times daily with a meal.     nicotine (NICODERM CQ - DOSED IN MG/24 HR) 7 mg/24hr patch Place 1 patch (7 mg total) onto the skin daily. 28 patch 0   PROAIR HFA 108 (90 Base) MCG/ACT inhaler Inhale 2 puffs into the lungs every 6 (six) hours as needed for wheezing or shortness of breath.      Labs  Lab Results:  No results displayed because visit has over 200 results.    Admission on 11/22/2021, Discharged on 11/22/2021  Component Date Value Ref Range Status   Glucose-Capillary 11/22/2021 169 (H)  70 - 99 mg/dL Final   Glucose reference range applies only to samples taken after fasting for at least 8 hours.  No results displayed because visit has over 200 results.    Admission on 10/04/2021, Discharged on 10/22/2021  Component Date Value Ref Range Status  SARS Coronavirus 2 by RT PCR 10/04/2021 NEGATIVE  NEGATIVE Final   Comment: (NOTE) SARS-CoV-2 target nucleic acids are NOT DETECTED.  The SARS-CoV-2 RNA is generally detectable in upper respiratory specimens during the acute phase of infection. The lowest concentration of SARS-CoV-2 viral copies this assay can detect is 138 copies/mL. A negative result does not preclude SARS-Cov-2 infection and should not be used  as the sole basis for treatment or other patient management decisions. A negative result may occur with  improper specimen collection/handling, submission of specimen other than nasopharyngeal swab, presence of viral mutation(s) within the areas targeted by this assay, and inadequate number of viral copies(<138 copies/mL). A negative result must be combined with clinical observations, patient history, and epidemiological information. The expected result is Negative.  Fact Sheet for Patients:  EntrepreneurPulse.com.au  Fact Sheet for Healthcare Providers:  IncredibleEmployment.be  This test is no                          t yet approved or cleared by the [place] FDA and  has been authorized for detection and/or diagnosis of SARS-CoV-2 by FDA under an Emergency Use Authorization (EUA). This EUA will remain  in effect (meaning this test can be used) for the duration of the COVID-19 declaration under Section 564(b)(1) of the Act, 21 U.S.C.section 360bbb-3(b)(1), unless the authorization is terminated  or revoked sooner.       Influenza A by PCR 10/04/2021 NEGATIVE  NEGATIVE Final   Influenza B by PCR 10/04/2021 NEGATIVE  NEGATIVE Final   Comment: (NOTE) The Xpert Xpress SARS-CoV-2/FLU/RSV plus assay is intended as an aid in the diagnosis of influenza from Nasopharyngeal swab specimens and should not be used as a sole basis for treatment. Nasal washings and aspirates are unacceptable for Xpert Xpress SARS-CoV-2/FLU/RSV testing.  Fact Sheet for Patients: EntrepreneurPulse.com.au  Fact Sheet for Healthcare Providers: IncredibleEmployment.be  This test is not yet approved or cleared by the [place] FDA and has been authorized for detection and/or diagnosis of SARS-CoV-2 by FDA under an Emergency Use Authorization (EUA). This EUA will remain in effect (meaning this test can be used) for the duration of  the COVID-19 declaration under Section 564(b)(1) of the Act, 21 U.S.C. section 360bbb-3(b)(1), unless the authorization is terminated or revoked.  Performed at [facility], [place] [address] [place], [place] [address]    WBC 10/04/2021 8.4  4.0 - 10.5 K/uL Final   RBC 10/04/2021 4.42  3.87 - 5.11 MIL/uL Final   Hemoglobin 10/04/2021 11.6 (L)  12.0 - 15.0 g/dL Final   HCT 10/04/2021 35.7 (L)  36.0 - 46.0 % Final   MCV 10/04/2021 80.8  80.0 - 100.0 fL Final   MCH 10/04/2021 26.2  26.0 - 34.0 pg Final   MCHC 10/04/2021 32.5  30.0 - 36.0 g/dL Final   RDW 10/04/2021 16.0 (H)  11.5 - 15.5 % Final   Platelets 10/04/2021 372  150 - 400 K/uL Final   nRBC 10/04/2021 0.0  0.0 - 0.2 % Final   Neutrophils Relative % 10/04/2021 68  % Final   Neutro Abs 10/04/2021 5.7  1.7 - 7.7 K/uL Final   Lymphocytes Relative 10/04/2021 22  % Final   Lymphs Abs 10/04/2021 1.8  0.7 - 4.0 K/uL Final   Monocytes Relative 10/04/2021 8  % Final   Monocytes Absolute 10/04/2021 0.7  0.1 - 1.0 K/uL Final   Eosinophils Relative 10/04/2021 1  % Final  Eosinophils Absolute 10/04/2021 0.1  0.0 - 0.5 K/uL Final   Basophils Relative 10/04/2021 1  % Final   Basophils Absolute 10/04/2021 0.1  0.0 - 0.1 K/uL Final   Immature Granulocytes 10/04/2021 0  % Final   Abs Immature Granulocytes 10/04/2021 0.03  0.00 - 0.07 K/uL Final   Performed at [facility], [place] [address] [place], [place] [address]   Sodium 10/04/2021 136  135 - 145 mmol/L Final   Potassium 10/04/2021 4.2  3.5 - 5.1 mmol/L Final   Chloride 10/04/2021 104  98 - 111 mmol/L Final   CO2 10/04/2021 25  22 - 32 mmol/L Final   Glucose, Bld 10/04/2021 100 (H)  70 - 99 mg/dL Final   Glucose reference range applies only to samples taken after fasting for at least 8 hours.   BUN 10/04/2021 9  6 - 20 mg/dL Final   Creatinine, Ser 10/04/2021 0.52  0.44 - 1.00 mg/dL Final   Calcium 10/04/2021 9.0  8.9 - 10.3 mg/dL Final   Total Protein 10/04/2021 7.0  6.5 -  8.1 g/dL Final   Albumin 10/04/2021 3.2 (L)  3.5 - 5.0 g/dL Final   AST 10/04/2021 13 (L)  15 - 41 U/L Final   ALT 10/04/2021 10  0 - 44 U/L Final   Alkaline Phosphatase 10/04/2021 61  38 - 126 U/L Final   Total Bilirubin 10/04/2021 0.3  0.3 - 1.2 mg/dL Final   GFR, Estimated 10/04/2021 >60  >60 mL/min Final   Comment: (NOTE) Calculated using the CKD-EPI Creatinine Equation (2021)    Anion gap 10/04/2021 7  5 - 15 Final   Performed at [facility] [address] [place], [place] [address]   Hgb A1c MFr Bld 10/04/2021 7.0 (H)  4.8 - 5.6 % Final   Comment: (NOTE) Pre diabetes:          5.7%-6.4%  Diabetes:              >6.4%  Glycemic control for   <7.0% adults with diabetes    Mean Plasma Glucose 10/04/2021 154.2  mg/dL Final   Performed at [facility], [place] [address], [place], [place] [address]   Cholesterol 10/04/2021 178  0 - 200 mg/dL Final   Triglycerides 10/04/2021 155 (H)  <150 mg/dL Final   HDL 10/04/2021 52  >40 mg/dL Final   Total CHOL/HDL Ratio 10/04/2021 3.4  RATIO Final   VLDL 10/04/2021 31  0 - 40 mg/dL Final   LDL Cholesterol 10/04/2021 95  0 - 99 mg/dL Final   Comment:        Total Cholesterol/HDL:CHD Risk Coronary Heart Disease Risk Table                     Men   Women  1/2 Average Risk   3.4   3.3  Average Risk       5.0   4.4  2 X Average Risk   9.6   7.1  3 X Average Risk  23.4   11.0        Use the calculated Patient Ratio above and the CHD Risk Table to determine the patient's CHD Risk.        ATP III CLASSIFICATION (LDL):  <100     mg/dL   Optimal  100-129  mg/dL   Near or Above  Optimal  130-159  mg/dL   Borderline  160-189  mg/dL   High  >190     mg/dL   Very High Performed at [facility] [address], [place], [place] [address]    POC Amphetamine UR 10/04/2021 None Detected  NONE DETECTED (Cut Off Level 1000 ng/mL) Final   POC Secobarbital (BAR) 10/04/2021 None Detected  NONE DETECTED (Cut Off  Level 300 ng/mL) Final   POC Buprenorphine (BUP) 10/04/2021 None Detected  NONE DETECTED (Cut Off Level 10 ng/mL) Final   POC Oxazepam (BZO) 10/04/2021 None Detected  NONE DETECTED (Cut Off Level 300 ng/mL) Final   POC Cocaine UR 10/04/2021 None Detected  NONE DETECTED (Cut Off Level 300 ng/mL) Final   POC Methamphetamine UR 10/04/2021 None Detected  NONE DETECTED (Cut Off Level 1000 ng/mL) Final   POC Morphine 10/04/2021 None Detected  NONE DETECTED (Cut Off Level 300 ng/mL) Final   POC Methadone UR 10/04/2021 None Detected  NONE DETECTED (Cut Off Level 300 ng/mL) Final   POC Oxycodone UR 10/04/2021 Positive (A)  NONE DETECTED (Cut Off Level 100 ng/mL) Final   POC Marijuana UR 10/04/2021 None Detected  NONE DETECTED (Cut Off Level 50 ng/mL) Final   SARSCOV2ONAVIRUS 2 AG 10/04/2021 NEGATIVE  NEGATIVE Final   Comment: (NOTE) SARS-CoV-2 antigen NOT DETECTED.   Negative results are presumptive.  Negative results do not preclude SARS-CoV-2 infection and should not be used as the sole basis for treatment or other patient management decisions, including infection  control decisions, particularly in the presence of clinical signs and  symptoms consistent with COVID-19, or in those who have been in contact with the virus.  Negative results must be combined with clinical observations, patient history, and epidemiological information. The expected result is Negative.  Fact Sheet for Patients: HandmadeRecipes.com.cy  Fact Sheet for Healthcare Providers: FuneralLife.at  This test is not yet approved or cleared by the [place] FDA and  has been authorized for detection and/or diagnosis of SARS-CoV-2 by FDA under an Emergency Use Authorization (EUA).  This EUA will remain in effect (meaning this test can be used) for the duration of  the COV                          ID-19 declaration under Section 564(b)(1) of the Act, 21 U.S.C. section  360bbb-3(b)(1), unless the authorization is terminated or revoked sooner.     Valproic Acid Lvl 10/04/2021 51  50.0 - 100.0 ug/mL Final   Performed at [facility] [address] [place], [place] [address]   Valproic Acid Lvl 10/08/2021 57  50.0 - 100.0 ug/mL Final   Performed at [facility] [address], [place], [place] [address]   Glucose-Capillary 10/09/2021 123 (H)  70 - 99 mg/dL Final   Glucose reference range applies only to samples taken after fasting for at least 8 hours.  Admission on 09/09/2021, Discharged on 09/10/2021  Component Date Value Ref Range Status   Sodium 09/09/2021 134 (L)  135 - 145 mmol/L Final   Potassium 09/09/2021 4.3  3.5 - 5.1 mmol/L Final   Chloride 09/09/2021 99  98 - 111 mmol/L Final   CO2 09/09/2021 25  22 - 32 mmol/L Final   Glucose, Bld 09/09/2021 107 (H)  70 - 99 mg/dL Final   Glucose reference range applies only to samples taken after fasting for at least 8 hours.   BUN 09/09/2021 12  6 - 20 mg/dL Final   Creatinine, Ser 09/09/2021 0.74  0.44 - 1.00 mg/dL Final   Calcium 09/09/2021 9.0  8.9 - 10.3 mg/dL Final   Total Protein 09/09/2021 7.4  6.5 - 8.1 g/dL Final   Albumin 09/09/2021 3.1 (L)  3.5 - 5.0 g/dL Final   AST 09/09/2021 13 (L)  15 - 41 U/L Final   ALT 09/09/2021 13  0 - 44 U/L Final   Alkaline Phosphatase 09/09/2021 66  38 - 126 U/L Final   Total Bilirubin 09/09/2021 0.2 (L)  0.3 - 1.2 mg/dL Final   GFR, Estimated 09/09/2021 >60  >60 mL/min Final   Comment: (NOTE) Calculated using the CKD-EPI Creatinine Equation (2021)    Anion gap 09/09/2021 10  5 - 15 Final   Performed at [facility], [place] [address] [place], [place] [address]   WBC 09/09/2021 8.5  4.0 - 10.5 K/uL Final   RBC 09/09/2021 4.53  3.87 - 5.11 MIL/uL Final   Hemoglobin 09/09/2021 11.8 (L)  12.0 - 15.0 g/dL Final   HCT 09/09/2021 38.0  36.0 - 46.0 % Final   MCV 09/09/2021 83.9  80.0 - 100.0 fL Final   MCH 09/09/2021 26.0  26.0 - 34.0 pg Final    MCHC 09/09/2021 31.1  30.0 - 36.0 g/dL Final   RDW 09/09/2021 17.8 (H)  11.5 - 15.5 % Final   Platelets 09/09/2021 442 (H)  150 - 400 K/uL Final   nRBC 09/09/2021 0.0  0.0 - 0.2 % Final   Neutrophils Relative % 09/09/2021 70  % Final   Neutro Abs 09/09/2021 5.9  1.7 - 7.7 K/uL Final   Lymphocytes Relative 09/09/2021 20  % Final   Lymphs Abs 09/09/2021 1.7  0.7 - 4.0 K/uL Final   Monocytes Relative 09/09/2021 8  % Final   Monocytes Absolute 09/09/2021 0.7  0.1 - 1.0 K/uL Final   Eosinophils Relative 09/09/2021 1  % Final   Eosinophils Absolute 09/09/2021 0.1  0.0 - 0.5 K/uL Final   Basophils Relative 09/09/2021 1  % Final   Basophils Absolute 09/09/2021 0.1  0.0 - 0.1 K/uL Final   Immature Granulocytes 09/09/2021 0  % Final   Abs Immature Granulocytes 09/09/2021 0.03  0.00 - 0.07 K/uL Final   Performed at [facility], [place] [address] [place], [place] [address]   Ammonia 09/09/2021 37 (H)  9 - 35 umol/L Final   Comment: HEMOLYSIS AT THIS LEVEL MAY AFFECT RESULT Performed at [facility], [place] [address], [place], [place] [address]    Opiates 09/09/2021 NONE DETECTED  NONE DETECTED Final   Cocaine 09/09/2021 NONE DETECTED  NONE DETECTED Final   Benzodiazepines 09/09/2021 NONE DETECTED  NONE DETECTED Final   Amphetamines 09/09/2021 NONE DETECTED  NONE DETECTED Final   Tetrahydrocannabinol 09/09/2021 NONE DETECTED  NONE DETECTED Final   Barbiturates 09/09/2021 NONE DETECTED  NONE DETECTED Final   Comment: (NOTE) DRUG SCREEN FOR MEDICAL PURPOSES ONLY.  IF CONFIRMATION IS NEEDED FOR ANY PURPOSE, NOTIFY LAB WITHIN 5 DAYS.  LOWEST DETECTABLE LIMITS FOR URINE DRUG SCREEN Drug Class                     Cutoff (ng/mL) Amphetamine and metabolites    1000 Barbiturate and metabolites    200 Benzodiazepine                 673 Tricyclics and metabolites     300 Opiates and metabolites  300 Cocaine and metabolites        300 THC                            50 Performed at  [facility], [place] [address], [place], [place] [address]    Alcohol, Ethyl (B) 09/09/2021 <10  <10 mg/dL Final   Comment: (NOTE) Lowest detectable limit for serum alcohol is 10 mg/dL.  For medical purposes only. Performed at [facility], [place] [address] [place], [place] [address]    Lipase 09/09/2021 33  11 - 51 U/L Final   Performed at [facility] [address], [place], [place] [address]   Color, Urine 09/09/2021 COLORLESS (A)  YELLOW Final   APPearance 09/09/2021 CLEAR  CLEAR Final   Specific Gravity, Urine 09/09/2021 1.002 (L)  1.005 - 1.030 Final   pH 09/09/2021 6.0  5.0 - 8.0 Final   Glucose, UA 09/09/2021 NEGATIVE  NEGATIVE mg/dL Final   Hgb urine dipstick 09/09/2021 NEGATIVE  NEGATIVE Final   Bilirubin Urine 09/09/2021 NEGATIVE  NEGATIVE Final   Ketones, ur 09/09/2021 NEGATIVE  NEGATIVE mg/dL Final   Protein, ur 09/09/2021 NEGATIVE  NEGATIVE mg/dL Final   Nitrite 09/09/2021 NEGATIVE  NEGATIVE Final   Leukocytes,Ua 09/09/2021 NEGATIVE  NEGATIVE Final   Performed at [facility] [address] [place], [place] [address]   Specimen Description 09/09/2021 URINE, CLEAN CATCH   Final   Special Requests 09/09/2021 NONE   Final   Culture 09/09/2021  (A)   Final                   Value:<10,000 COLONIES/mL INSIGNIFICANT GROWTH Performed at [facility], [place] [address], [place], [place] [address]    Report Status 09/09/2021 09/10/2021 FINAL   Final   D-Dimer, Quant 09/09/2021 0.92 (H)  0.00 - 0.50 ug/mL-FEU Final   Comment: (NOTE) At the manufacturer cut-off value of 0.5 g/mL FEU, this assay has a negative predictive value of 95-100%.This assay is intended for use in conjunction with a clinical pretest probability (PTP) assessment model to exclude pulmonary embolism (PE) and deep venous thrombosis (DVT) in outpatients suspected of PE or DVT. Results should be correlated with clinical presentation. Performed at [facility], [place]  [address] [place], [place] [address]    Troponin I (High Sensitivity) 09/09/2021 5  <18 ng/L Final   Comment: (NOTE) Elevated high sensitivity troponin I (hsTnI) values and significant  changes across serial measurements may suggest ACS but many other  chronic and acute conditions are known to elevate hsTnI results.  Refer to the "Links" section for chest pain algorithms and additional  guidance. Performed at [facility], [place] [address] [place],  [address]    BP 09/10/2021 135/83  mmHg Final   S' Lateral 09/10/2021 2.30  cm Final   Area-P 1/2 09/10/2021 5.58  cm2 Final    Blood Alcohol level:  Lab Results  Component Value Date   ETH <10 11/23/2021   ETH <10 25/36/6440    Metabolic Disorder Labs: Lab Results  Component Value Date   HGBA1C 6.7 (H) 12/28/2021   MPG 145.59 12/28/2021   MPG 154.2 10/04/2021   Lab Results  Component Value Date   PROLACTIN 3.8 (L) 11/23/2021   Lab Results  Component Value Date   CHOL 171 11/23/2021   TRIG 125 11/23/2021   HDL 65 11/23/2021  CHOLHDL 2.6 11/23/2021   VLDL 25 11/23/2021   LDLCALC 81 11/23/2021   LDLCALC 95 10/04/2021    Therapeutic Lab Levels: No results found for: "LITHIUM" Lab Results  Component Value Date   VALPROATE 33 (L) 12/28/2021   VALPROATE 54 12/03/2021   No results found for: "CBMZ"  Physical Findings   PHQ2-9    [place] ED from 11/23/2021 in [facility]  PHQ-2 Total Score 2  PHQ-9 Total Score 3      Flowsheet Row ED from 11/23/2021 in [facility] Most recent reading at 11/23/2021  6:44 PM ED from 11/22/2021 in [place] [facility] Most recent reading at 11/22/2021  8:28 PM ED from 11/22/2021 in [place] [facility] Most recent reading at 11/22/2021  1:24 AM  C-SSRS RISK CATEGORY No Risk No Risk No Risk        Musculoskeletal  Strength & Muscle Tone: within normal limits Gait  & Station: normal Patient leans: N/A  Psychiatric Specialty Exam  Presentation  General Appearance:  Appropriate for Environment  Eye Contact: Fair  Speech: Clear and Coherent  Speech Volume: Normal  Handedness: Right   Mood and Affect  Mood: Euthymic  Affect: Flat   Thought Process  Thought Processes: Coherent  Descriptions of Associations:Intact  Orientation:Full (Time, Place and Person)  Thought Content:WDL  Diagnosis of Schizophrenia or Schizoaffective disorder in past: Yes  Duration of Psychotic Symptoms: No data recorded  Hallucinations:Hallucinations: None  Ideas of Reference:None  Suicidal Thoughts:Suicidal Thoughts: No  Homicidal Thoughts:Homicidal Thoughts: No   Sensorium  Memory: Immediate Fair  Judgment: Intact  Insight: Present   Executive Functions  Concentration: Fair  Attention Span: Fair  Recall: [employer] of Knowledge: Fair  Language: Fair   Psychomotor Activity  Psychomotor Activity: Psychomotor Activity: Normal   Assets  Assets: Communication Skills; Desire for Improvement   Sleep  Sleep: Sleep: Fair   No data recorded  Physical Exam  Physical Exam Constitutional:      General: She is not in acute distress.    Appearance: She is not ill-appearing, toxic-appearing or diaphoretic.  Eyes:     General: No scleral icterus. Cardiovascular:     Rate and Rhythm: Normal rate.  Pulmonary:     Effort: Pulmonary effort is normal. No respiratory distress.  Neurological:     Mental Status: She is alert and oriented to person, place, and time.  Psychiatric:        Attention and Perception: Attention and perception normal.        Mood and Affect: Mood normal. Affect is flat.        Speech: Speech normal.        Behavior: Behavior normal. Behavior is cooperative.        Thought Content: Thought content normal.    Review of Systems  Constitutional:  Negative for chills and fever.  Respiratory:   Negative for shortness of breath.   Cardiovascular:  Negative for chest pain and palpitations.  Gastrointestinal:  Negative for abdominal pain.  Neurological:  Negative for headaches.   Blood pressure 112/79, pulse 78, temperature 97.9 F (36.6 C), temperature source Oral, resp. rate 18, SpO2 98 %. There is no height or weight on file to calculate BMI.  Treatment Plan Summary: Plan Disposition pending placement  Tharon Aquas, NP 01/06/2022 8:33 AM

## 2022-01-06 NOTE — ED Notes (Addendum)
Provider made aware of patient complaining of right foot pain and wants to be seen in the ED

## 2022-01-06 NOTE — ED Notes (Signed)
No pain or discomfort noted/ reported. Pt alert, oriented, and ambulatory.  Breathing is even and  unlabored.  Will continue to monitor for safety. 

## 2022-01-06 NOTE — ED Notes (Signed)
Pt sleeping at present, no distress noted.  Monitoring for safety. 

## 2022-01-07 DIAGNOSIS — F209 Schizophrenia, unspecified: Secondary | ICD-10-CM | POA: Diagnosis not present

## 2022-01-07 LAB — CBC WITH DIFFERENTIAL/PLATELET
Abs Immature Granulocytes: 0.04 10*3/uL (ref 0.00–0.07)
Basophils Absolute: 0.1 10*3/uL (ref 0.0–0.1)
Basophils Relative: 1 %
Eosinophils Absolute: 0.2 10*3/uL (ref 0.0–0.5)
Eosinophils Relative: 2 %
HCT: 34.9 % — ABNORMAL LOW (ref 36.0–46.0)
Hemoglobin: 11.2 g/dL — ABNORMAL LOW (ref 12.0–15.0)
Immature Granulocytes: 1 %
Lymphocytes Relative: 39 %
Lymphs Abs: 2.7 10*3/uL (ref 0.7–4.0)
MCH: 25.4 pg — ABNORMAL LOW (ref 26.0–34.0)
MCHC: 32.1 g/dL (ref 30.0–36.0)
MCV: 79.1 fL — ABNORMAL LOW (ref 80.0–100.0)
Monocytes Absolute: 0.6 10*3/uL (ref 0.1–1.0)
Monocytes Relative: 9 %
Neutro Abs: 3.4 10*3/uL (ref 1.7–7.7)
Neutrophils Relative %: 48 %
Platelets: 321 10*3/uL (ref 150–400)
RBC: 4.41 MIL/uL (ref 3.87–5.11)
RDW: 17.9 % — ABNORMAL HIGH (ref 11.5–15.5)
WBC: 7 10*3/uL (ref 4.0–10.5)
nRBC: 0 % (ref 0.0–0.2)

## 2022-01-07 LAB — GLUCOSE, CAPILLARY
Glucose-Capillary: 94 mg/dL (ref 70–99)
Glucose-Capillary: 143 mg/dL — ABNORMAL HIGH (ref 70–99)
Glucose-Capillary: 99 mg/dL (ref 70–99)

## 2022-01-07 MED ORDER — TRAMADOL HCL 50 MG PO TABS: 50.0000 mg | ORAL_TABLET | ORAL | Status: DC

## 2022-01-07 MED ORDER — TRAMADOL HCL 50 MG PO TABS
100.0000 mg | ORAL_TABLET | ORAL | Status: AC
  Administered 2022-01-07: 100 mg via ORAL
  Filled 2022-01-07: qty 2

## 2022-01-07 NOTE — ED Provider Notes (Signed)
Behavioral Health Progress Note  Date and Time: 01/07/2022 10:54 AM Name: Teresa Murillo MRN:  [id number]  Subjective:   Teresa Murillo is a 61 y.o. female with a past psychiatric history of schizophrenia, aggressive behavior, and possible intellectual disability presenting to [facility] on 11/23/21 voluntarily as a walk in via [facility] with complaints that she was locked out of the group home. Patient has been dismissed from group home due to multiple elopements. Pt had already been discharged from this facility but had been living there temporarily while DSS was looking for new placement. Pt is boarding at [facility].   AID to Capacity Evaluation (ACE) completed by Dr. Louis Meckel, Dr. Dwyane Dee on 12/11/2021.  Please see media tab for full details.    Teresa Harman, PhD completed: Wechsler Adult Intelligence Scale-4, Teresa Murillo achieved a full-scale IQ score of 73 and a percentile rank of 4 placing her in the borderline range of intellectual functioning (12/14/2021) Please see consult note from Teresa Harman, PhD on 12/14/2021  On reassessment, pt is awake, sitting on reclining chair. Pt denies SI/VI/HI, AVH, paranoia. She denies any physical complaints. Pt remains psychiatrically cleared. Disposition is pending placement.  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 On the Wechsler Adult Intelligence Scale-4, Teresa Murillo achieved a full-scale IQ score of 73 and a percentile rank of 4 placing her in the borderline range of intellectual functioning.    Chronic obstructive pulmonary disease (COPD) (HCC)    Glaucoma    Hyperlipidemia     Hypertension    Iron deficiency    Schizoaffective disorder (HCC)    Type 2 diabetes mellitus (HCC)     Past Surgical History:  Procedure Laterality Date   TUBAL LIGATION     Family History:  Family History  Problem Relation Age of Onset   Breast cancer Maternal Grandmother    Family Psychiatric  History: None reported Social History:  Social History   Substance and Sexual Activity  Alcohol Use Yes     Social History   Substance and Sexual Activity  Drug Use Not Currently    Social History   Socioeconomic History   Marital status: Divorced    Spouse name: Not on file   Number of children: Not on file   Years of education: Not on file   Highest education level: Not on file  Occupational History   Not on file  Tobacco Use   Smoking status: Every Day    Packs/day: 1.00    Types: Cigars, Cigarettes   Smokeless tobacco: Current  Vaping Use   Vaping Use: Never used  Substance and Sexual Activity   Alcohol use: Yes   Drug use: Not Currently   Sexual activity: Not Currently    Birth control/protection: Surgical  Other Topics Concern   Not on file  Social History Narrative   Not on file   Social Determinants of Health   Financial Resource Strain: Not on file  Food Insecurity: Not on file  Transportation Needs: Not on file  Physical Activity: Not on file  Stress: Not on file  Social Connections: Not on file   SDOH:  SDOH Screenings   Depression (PHQ2-9): Low Risk  (  11/23/2021)  Tobacco Use: High Risk (12/30/2021)   Additional Social History:    Pain Medications: See MAR Prescriptions: See MAR Over the Counter: See MAR History of alcohol / drug use?: No history of alcohol / drug abuse Longest period of sobriety (when/how long): N/A                    Sleep: Good  Appetite:  Good  Current Medications:  Current Facility-Administered Medications  Medication Dose Route Frequency Provider Last Rate Last Admin   acetaminophen (TYLENOL)  tablet 650 mg  650 mg Oral Q6H PRN Rankin, Shuvon B, NP   650 mg at 01/06/22 2245   albuterol (VENTOLIN HFA) 108 (90 Base) MCG/ACT inhaler 2 puff  2 puff Inhalation Q6H PRN Rankin, Shuvon B, NP   2 puff at 12/09/21 2102   alum & mag hydroxide-simeth (MAALOX/MYLANTA) 200-200-20 MG/5ML suspension 30 mL  30 mL Oral Q4H PRN Rankin, Shuvon B, NP   30 mL at 12/19/21 2109   apixaban (ELIQUIS) tablet 5 mg  5 mg Oral BID Rankin, Shuvon B, NP   5 mg at 01/07/22 0936   cloZAPine (CLOZARIL) tablet 50 mg  50 mg Oral Daily [name], NP   50 mg at 01/07/22 0934   cloZAPine (CLOZARIL) tablet 75 mg  75 mg Oral Dorie Rank, NP   75 mg at 01/06/22 2132   diltiazem (CARDIZEM CD) 24 hr capsule 240 mg  240 mg Oral Daily Rankin, Shuvon B, NP   240 mg at 01/07/22 0937   divalproex (DEPAKOTE ER) 24 hr tablet 500 mg  500 mg Oral BID Rankin, Shuvon B, NP   500 mg at 01/07/22 0936   fluticasone furoate-vilanterol (BREO ELLIPTA) 200-25 MCG/ACT 1 puff  1 puff Inhalation Daily Rankin, Shuvon B, NP   1 puff at 01/07/22 0829   haloperidol (HALDOL) tablet 10 mg  10 mg Oral Q8H PRN Rankin, Shuvon B, NP       insulin aspart (novoLOG) injection 0-9 Units  0-9 Units Subcutaneous TID WC Tharon Aquas, NP   2 Units at 01/06/22 1642   insulin glargine-yfgn (SEMGLEE) injection 10 Units  10 Units Subcutaneous Daily [name], DO   10 Units at 01/07/22 0938   latanoprost (XALATAN) 0.005 % ophthalmic solution 1 drop  1 drop Both Eyes QHS Rankin, Shuvon B, NP   1 drop at 01/06/22 2135   magnesium hydroxide (MILK OF MAGNESIA) suspension 30 mL  30 mL Oral Daily PRN Rankin, Shuvon B, NP       menthol-cetylpyridinium (CEPACOL) lozenge 3 mg  1 lozenge Oral Once [name], NP       menthol-cetylpyridinium (CEPACOL) lozenge 3 mg  1 lozenge Oral PRN [name], NP   3 mg at 01/07/22 0152   metFORMIN (GLUCOPHAGE-XR) 24 hr tablet 1,000 mg  1,000 mg Oral BID WC [name], DO   1,000 mg at 01/07/22 0829   multivitamins with  iron tablet 1 tablet  1 tablet Oral Daily [name], DO   1 tablet at 01/07/22 0935   nicotine (NICODERM CQ - dosed in mg/24 hr) patch 7 mg  7 mg Transdermal Daily Rankin, Shuvon B, NP   7 mg at 01/07/22 0936   polyethylene glycol (MIRALAX / GLYCOLAX) packet 17 g  17 g Oral Daily [name], DO   17 g at 01/07/22 0934   Vitamin D (Ergocalciferol) (DRISDOL) 1.25 MG (50000 UNIT) capsule 50,000 Units  50,000 Units Oral Q7 days  Rankin, Shuvon B, NP   50,000 Units at 01/07/22 8937   Current Outpatient Medications  Medication Sig Dispense Refill   Accu-Chek Softclix Lancets lancets Use as directed up to four times daily 100 each 0   apixaban (ELIQUIS) 5 MG TABS tablet Take 1 tablet (5 mg total) by mouth 2 (two) times daily. 60 tablet 0   ARIPiprazole (ABILIFY) 10 MG tablet Take 1 tablet (10 mg total) by mouth daily. 30 tablet 0   Blood Glucose Monitoring Suppl (ACCU-CHEK GUIDE) w/Device KIT Use as directed up to four times daily 1 kit 0   budesonide-formoterol (SYMBICORT) 160-4.5 MCG/ACT inhaler Inhale 2 puffs into the lungs in the morning and at bedtime.     Cholecalciferol (VITAMIN D3) 1.25 MG (50000 UT) CAPS Take 50,000 Units by mouth every Thursday.     cloZAPine (CLOZARIL) 25 MG tablet Take 3 tablets (75 mg total) by mouth at bedtime. 90 tablet 0   clozapine (CLOZARIL) 50 MG tablet Take 1 tablet (50 mg total) by mouth daily. 30 tablet 0   diltiazem (CARDIZEM CD) 240 MG 24 hr capsule Take 1 capsule (240 mg total) by mouth daily. (Patient not taking: Reported on 11/24/2021) 30 capsule 0   divalproex (DEPAKOTE ER) 500 MG 24 hr tablet Take 1 tablet (500 mg total) by mouth 2 (two) times daily. 60 tablet 0   glucose blood test strip Use as directed up to four times daily 50 each 0   haloperidol (HALDOL) 10 MG tablet Take 1 tablet (10 mg total) by mouth 3 (three) times daily as needed for agitation (and psychotic symptoms).     INGREZZA 40 MG capsule Take 1 capsule (40 mg total) by mouth in the  morning. 30 capsule 0   insulin aspart (NOVOLOG) 100 UNIT/ML FlexPen Before each meal 3 times a day, 140-199 - 2 units, 200-250 - 4 units, 251-299 - 6 units,  300-349 - 8 units,  350 or above 10 units. Insulin PEN if approved, provide syringes and needles if needed.Please switch to any approved short acting Insulin if needed. 15 mL 0   insulin glargine (LANTUS) 100 UNIT/ML Solostar Pen Inject 12 Units into the skin daily. 15 mL 0   Insulin Pen Needle 32G X 4 MM MISC Use 4 times a day with insulin, 1 month supply. 100 each 0   latanoprost (XALATAN) 0.005 % ophthalmic solution Place 1 drop into both eyes at bedtime.     metFORMIN (GLUCOPHAGE) 500 MG tablet Take 500 mg by mouth 2 (two) times daily with a meal.     nicotine (NICODERM CQ - DOSED IN MG/24 HR) 7 mg/24hr patch Place 1 patch (7 mg total) onto the skin daily. 28 patch 0   PROAIR HFA 108 (90 Base) MCG/ACT inhaler Inhale 2 puffs into the lungs every 6 (six) hours as needed for wheezing or shortness of breath.      Labs  Lab Results:  No results displayed because visit has over 200 results.    Admission on 11/22/2021, Discharged on 11/22/2021  Component Date Value Ref Range Status   Glucose-Capillary 11/22/2021 169 (H)  70 - 99 mg/dL Final   Glucose reference range applies only to samples taken after fasting for at least 8 hours.  No results displayed because visit has over 200 results.    Admission on 10/04/2021, Discharged on 10/22/2021  Component Date Value Ref Range Status   SARS Coronavirus 2 by RT PCR 10/04/2021 NEGATIVE  NEGATIVE Final   Comment: (NOTE)  SARS-CoV-2 target nucleic acids are NOT DETECTED.  The SARS-CoV-2 RNA is generally detectable in upper respiratory specimens during the acute phase of infection. The lowest concentration of SARS-CoV-2 viral copies this assay can detect is 138 copies/mL. A negative result does not preclude SARS-Cov-2 infection and should not be used as the sole basis for treatment or other  patient management decisions. A negative result may occur with  improper specimen collection/handling, submission of specimen other than nasopharyngeal swab, presence of viral mutation(s) within the areas targeted by this assay, and inadequate number of viral copies(<138 copies/mL). A negative result must be combined with clinical observations, patient history, and epidemiological information. The expected result is Negative.  Fact Sheet for Patients:  EntrepreneurPulse.com.au  Fact Sheet for Healthcare Providers:  IncredibleEmployment.be  This test is no                          t yet approved or cleared by the [place] FDA and  has been authorized for detection and/or diagnosis of SARS-CoV-2 by FDA under an Emergency Use Authorization (EUA). This EUA will remain  in effect (meaning this test can be used) for the duration of the COVID-19 declaration under Section 564(b)(1) of the Act, 21 U.S.C.section 360bbb-3(b)(1), unless the authorization is terminated  or revoked sooner.       Influenza A by PCR 10/04/2021 NEGATIVE  NEGATIVE Final   Influenza B by PCR 10/04/2021 NEGATIVE  NEGATIVE Final   Comment: (NOTE) The Xpert Xpress SARS-CoV-2/FLU/RSV plus assay is intended as an aid in the diagnosis of influenza from Nasopharyngeal swab specimens and should not be used as a sole basis for treatment. Nasal washings and aspirates are unacceptable for Xpert Xpress SARS-CoV-2/FLU/RSV testing.  Fact Sheet for Patients: EntrepreneurPulse.com.au  Fact Sheet for Healthcare Providers: IncredibleEmployment.be  This test is not yet approved or cleared by the [place] FDA and has been authorized for detection and/or diagnosis of SARS-CoV-2 by FDA under an Emergency Use Authorization (EUA). This EUA will remain in effect (meaning this test can be used) for the duration of the COVID-19 declaration under Section  564(b)(1) of the Act, 21 U.S.C. section 360bbb-3(b)(1), unless the authorization is terminated or revoked.  Performed at [facility], [place] [address], [place], [place] [address]    WBC 10/04/2021 8.4  4.0 - 10.5 K/uL Final   RBC 10/04/2021 4.42  3.87 - 5.11 MIL/uL Final   Hemoglobin 10/04/2021 11.6 (L)  12.0 - 15.0 g/dL Final   HCT 10/04/2021 35.7 (L)  36.0 - 46.0 % Final   MCV 10/04/2021 80.8  80.0 - 100.0 fL Final   MCH 10/04/2021 26.2  26.0 - 34.0 pg Final   MCHC 10/04/2021 32.5  30.0 - 36.0 g/dL Final   RDW 10/04/2021 16.0 (H)  11.5 - 15.5 % Final   Platelets 10/04/2021 372  150 - 400 K/uL Final   nRBC 10/04/2021 0.0  0.0 - 0.2 % Final   Neutrophils Relative % 10/04/2021 68  % Final   Neutro Abs 10/04/2021 5.7  1.7 - 7.7 K/uL Final   Lymphocytes Relative 10/04/2021 22  % Final   Lymphs Abs 10/04/2021 1.8  0.7 - 4.0 K/uL Final   Monocytes Relative 10/04/2021 8  % Final   Monocytes Absolute 10/04/2021 0.7  0.1 - 1.0 K/uL Final   Eosinophils Relative 10/04/2021 1  % Final   Eosinophils Absolute 10/04/2021 0.1  0.0 - 0.5 K/uL Final   Basophils Relative  10/04/2021 1  % Final   Basophils Absolute 10/04/2021 0.1  0.0 - 0.1 K/uL Final   Immature Granulocytes 10/04/2021 0  % Final   Abs Immature Granulocytes 10/04/2021 0.03  0.00 - 0.07 K/uL Final   Performed at [facility], [place] [address], [place], [place] [address]   Sodium 10/04/2021 136  135 - 145 mmol/L Final   Potassium 10/04/2021 4.2  3.5 - 5.1 mmol/L Final   Chloride 10/04/2021 104  98 - 111 mmol/L Final   CO2 10/04/2021 25  22 - 32 mmol/L Final   Glucose, Bld 10/04/2021 100 (H)  70 - 99 mg/dL Final   Glucose reference range applies only to samples taken after fasting for at least 8 hours.   BUN 10/04/2021 9  6 - 20 mg/dL Final   Creatinine, Ser 10/04/2021 0.52  0.44 - 1.00 mg/dL Final   Calcium 10/04/2021 9.0  8.9 - 10.3 mg/dL Final   Total Protein 10/04/2021 7.0  6.5 - 8.1 g/dL Final   Albumin 10/04/2021 3.2  (L)  3.5 - 5.0 g/dL Final   AST 10/04/2021 13 (L)  15 - 41 U/L Final   ALT 10/04/2021 10  0 - 44 U/L Final   Alkaline Phosphatase 10/04/2021 61  38 - 126 U/L Final   Total Bilirubin 10/04/2021 0.3  0.3 - 1.2 mg/dL Final   GFR, Estimated 10/04/2021 >60  >60 mL/min Final   Comment: (NOTE) Calculated using the CKD-EPI Creatinine Equation (2021)    Anion gap 10/04/2021 7  5 - 15 Final   Performed at [facility] [address], [place], [place] [address]   Hgb A1c MFr Bld 10/04/2021 7.0 (H)  4.8 - 5.6 % Final   Comment: (NOTE) Pre diabetes:          5.7%-6.4%  Diabetes:              >6.4%  Glycemic control for   <7.0% adults with diabetes    Mean Plasma Glucose 10/04/2021 154.2  mg/dL Final   Performed at [facility], [place] [address] [place], [place] [address]   Cholesterol 10/04/2021 178  0 - 200 mg/dL Final   Triglycerides 10/04/2021 155 (H)  <150 mg/dL Final   HDL 10/04/2021 52  >40 mg/dL Final   Total CHOL/HDL Ratio 10/04/2021 3.4  RATIO Final   VLDL 10/04/2021 31  0 - 40 mg/dL Final   LDL Cholesterol 10/04/2021 95  0 - 99 mg/dL Final   Comment:        Total Cholesterol/HDL:CHD Risk Coronary Heart Disease Risk Table                     Men   Women  1/2 Average Risk   3.4   3.3  Average Risk       5.0   4.4  2 X Average Risk   9.6   7.1  3 X Average Risk  23.4   11.0        Use the calculated Patient Ratio above and the CHD Risk Table to determine the patient's CHD Risk.        ATP III CLASSIFICATION (LDL):  <100     mg/dL   Optimal  100-129  mg/dL   Near or Above                    Optimal  130-159  mg/dL   Borderline  160-189  mg/dL  High  >190     mg/dL   Very High Performed at [facility] [address], [place], [place] [address]    POC Amphetamine UR 10/04/2021 None Detected  NONE DETECTED (Cut Off Level 1000 ng/mL) Final   POC Secobarbital (BAR) 10/04/2021 None Detected  NONE DETECTED (Cut Off Level 300 ng/mL) Final   POC Buprenorphine  (BUP) 10/04/2021 None Detected  NONE DETECTED (Cut Off Level 10 ng/mL) Final   POC Oxazepam (BZO) 10/04/2021 None Detected  NONE DETECTED (Cut Off Level 300 ng/mL) Final   POC Cocaine UR 10/04/2021 None Detected  NONE DETECTED (Cut Off Level 300 ng/mL) Final   POC Methamphetamine UR 10/04/2021 None Detected  NONE DETECTED (Cut Off Level 1000 ng/mL) Final   POC Morphine 10/04/2021 None Detected  NONE DETECTED (Cut Off Level 300 ng/mL) Final   POC Methadone UR 10/04/2021 None Detected  NONE DETECTED (Cut Off Level 300 ng/mL) Final   POC Oxycodone UR 10/04/2021 Positive (A)  NONE DETECTED (Cut Off Level 100 ng/mL) Final   POC Marijuana UR 10/04/2021 None Detected  NONE DETECTED (Cut Off Level 50 ng/mL) Final   SARSCOV2ONAVIRUS 2 AG 10/04/2021 NEGATIVE  NEGATIVE Final   Comment: (NOTE) SARS-CoV-2 antigen NOT DETECTED.   Negative results are presumptive.  Negative results do not preclude SARS-CoV-2 infection and should not be used as the sole basis for treatment or other patient management decisions, including infection  control decisions, particularly in the presence of clinical signs and  symptoms consistent with COVID-19, or in those who have been in contact with the virus.  Negative results must be combined with clinical observations, patient history, and epidemiological information. The expected result is Negative.  Fact Sheet for Patients: HandmadeRecipes.com.cy  Fact Sheet for Healthcare Providers: FuneralLife.at  This test is not yet approved or cleared by the [place] FDA and  has been authorized for detection and/or diagnosis of SARS-CoV-2 by FDA under an Emergency Use Authorization (EUA).  This EUA will remain in effect (meaning this test can be used) for the duration of  the COV                          ID-19 declaration under Section 564(b)(1) of the Act, 21 U.S.C. section 360bbb-3(b)(1), unless the authorization  is terminated or revoked sooner.     Valproic Acid Lvl 10/04/2021 51  50.0 - 100.0 ug/mL Final   Performed at [facility] [address], [place], [place] [address]   Valproic Acid Lvl 10/08/2021 57  50.0 - 100.0 ug/mL Final   Performed at [facility] [address] [place], [place] [address]   Glucose-Capillary 10/09/2021 123 (H)  70 - 99 mg/dL Final   Glucose reference range applies only to samples taken after fasting for at least 8 hours.  Admission on 09/09/2021, Discharged on 09/10/2021  Component Date Value Ref Range Status   Sodium 09/09/2021 134 (L)  135 - 145 mmol/L Final   Potassium 09/09/2021 4.3  3.5 - 5.1 mmol/L Final   Chloride 09/09/2021 99  98 - 111 mmol/L Final   CO2 09/09/2021 25  22 - 32 mmol/L Final   Glucose, Bld 09/09/2021 107 (H)  70 - 99 mg/dL Final   Glucose reference range applies only to samples taken after fasting for at least 8 hours.   BUN 09/09/2021 12  6 - 20 mg/dL Final   Creatinine, Ser 09/09/2021 0.74  0.44 -  1.00 mg/dL Final   Calcium 09/09/2021 9.0  8.9 - 10.3 mg/dL Final   Total Protein 09/09/2021 7.4  6.5 - 8.1 g/dL Final   Albumin 09/09/2021 3.1 (L)  3.5 - 5.0 g/dL Final   AST 09/09/2021 13 (L)  15 - 41 U/L Final   ALT 09/09/2021 13  0 - 44 U/L Final   Alkaline Phosphatase 09/09/2021 66  38 - 126 U/L Final   Total Bilirubin 09/09/2021 0.2 (L)  0.3 - 1.2 mg/dL Final   GFR, Estimated 09/09/2021 >60  >60 mL/min Final   Comment: (NOTE) Calculated using the CKD-EPI Creatinine Equation (2021)    Anion gap 09/09/2021 10  5 - 15 Final   Performed at [facility] [address], [place], [place] [address]   WBC 09/09/2021 8.5  4.0 - 10.5 K/uL Final   RBC 09/09/2021 4.53  3.87 - 5.11 MIL/uL Final   Hemoglobin 09/09/2021 11.8 (L)  12.0 - 15.0 g/dL Final   HCT 09/09/2021 38.0  36.0 - 46.0 % Final   MCV 09/09/2021 83.9  80.0 - 100.0 fL Final   MCH 09/09/2021 26.0  26.0 - 34.0 pg Final   MCHC 09/09/2021 31.1  30.0 - 36.0 g/dL  Final   RDW 09/09/2021 17.8 (H)  11.5 - 15.5 % Final   Platelets 09/09/2021 442 (H)  150 - 400 K/uL Final   nRBC 09/09/2021 0.0  0.0 - 0.2 % Final   Neutrophils Relative % 09/09/2021 70  % Final   Neutro Abs 09/09/2021 5.9  1.7 - 7.7 K/uL Final   Lymphocytes Relative 09/09/2021 20  % Final   Lymphs Abs 09/09/2021 1.7  0.7 - 4.0 K/uL Final   Monocytes Relative 09/09/2021 8  % Final   Monocytes Absolute 09/09/2021 0.7  0.1 - 1.0 K/uL Final   Eosinophils Relative 09/09/2021 1  % Final   Eosinophils Absolute 09/09/2021 0.1  0.0 - 0.5 K/uL Final   Basophils Relative 09/09/2021 1  % Final   Basophils Absolute 09/09/2021 0.1  0.0 - 0.1 K/uL Final   Immature Granulocytes 09/09/2021 0  % Final   Abs Immature Granulocytes 09/09/2021 0.03  0.00 - 0.07 K/uL Final   Performed at [facility], [place] [address], [place], [place] [address]   Ammonia 09/09/2021 37 (H)  9 - 35 umol/L Final   Comment: HEMOLYSIS AT THIS LEVEL MAY AFFECT RESULT Performed at [facility], [place] [address], [place], [place] [address]    Opiates 09/09/2021 NONE DETECTED  NONE DETECTED Final   Cocaine 09/09/2021 NONE DETECTED  NONE DETECTED Final   Benzodiazepines 09/09/2021 NONE DETECTED  NONE DETECTED Final   Amphetamines 09/09/2021 NONE DETECTED  NONE DETECTED Final   Tetrahydrocannabinol 09/09/2021 NONE DETECTED  NONE DETECTED Final   Barbiturates 09/09/2021 NONE DETECTED  NONE DETECTED Final   Comment: (NOTE) DRUG SCREEN FOR MEDICAL PURPOSES ONLY.  IF CONFIRMATION IS NEEDED FOR ANY PURPOSE, NOTIFY LAB WITHIN 5 DAYS.  LOWEST DETECTABLE LIMITS FOR URINE DRUG SCREEN Drug Class                     Cutoff (ng/mL) Amphetamine and metabolites    1000 Barbiturate and metabolites    200 Benzodiazepine                 885 Tricyclics and metabolites     300 Opiates and metabolites        300 Cocaine and metabolites  300 THC                            50 Performed at [facility], [place] [address],  [place], [place] [address]    Alcohol, Ethyl (B) 09/09/2021 <10  <10 mg/dL Final   Comment: (NOTE) Lowest detectable limit for serum alcohol is 10 mg/dL.  For medical purposes only. Performed at [facility], [place] [address], [place], [place] [address]    Lipase 09/09/2021 33  11 - 51 U/L Final   Performed at [facility] [address], [place], [place] [address]   Color, Urine 09/09/2021 COLORLESS (A)  YELLOW Final   APPearance 09/09/2021 CLEAR  CLEAR Final   Specific Gravity, Urine 09/09/2021 1.002 (L)  1.005 - 1.030 Final   pH 09/09/2021 6.0  5.0 - 8.0 Final   Glucose, UA 09/09/2021 NEGATIVE  NEGATIVE mg/dL Final   Hgb urine dipstick 09/09/2021 NEGATIVE  NEGATIVE Final   Bilirubin Urine 09/09/2021 NEGATIVE  NEGATIVE Final   Ketones, ur 09/09/2021 NEGATIVE  NEGATIVE mg/dL Final   Protein, ur 09/09/2021 NEGATIVE  NEGATIVE mg/dL Final   Nitrite 09/09/2021 NEGATIVE  NEGATIVE Final   Leukocytes,Ua 09/09/2021 NEGATIVE  NEGATIVE Final   Performed at [facility] [address] [place], [place] [address]   Specimen Description 09/09/2021 URINE, CLEAN CATCH   Final   Special Requests 09/09/2021 NONE   Final   Culture 09/09/2021  (A)   Final                   Value:<10,000 COLONIES/mL INSIGNIFICANT GROWTH Performed at [facility], [place] [address], [place], [place] [address]    Report Status 09/09/2021 09/10/2021 FINAL   Final   D-Dimer, Quant 09/09/2021 0.92 (H)  0.00 - 0.50 ug/mL-FEU Final   Comment: (NOTE) At the manufacturer cut-off value of 0.5 g/mL FEU, this assay has a negative predictive value of 95-100%.This assay is intended for use in conjunction with a clinical pretest probability (PTP) assessment model to exclude pulmonary embolism (PE) and deep venous thrombosis (DVT) in outpatients suspected of PE or DVT. Results should be correlated with clinical presentation. Performed at [facility], [place] [address], [place], [place] [address]     Troponin I (High Sensitivity) 09/09/2021 5  <18 ng/L Final   Comment: (NOTE) Elevated high sensitivity troponin I (hsTnI) values and significant  changes across serial measurements may suggest ACS but many other  chronic and acute conditions are known to elevate hsTnI results.  Refer to the "Links" section for chest pain algorithms and additional  guidance. Performed at [facility], [place] [address] [place],  [address]    BP 09/10/2021 135/83  mmHg Final   S' Lateral 09/10/2021 2.30  cm Final   Area-P 1/2 09/10/2021 5.58  cm2 Final    Blood Alcohol level:  Lab Results  Component Value Date   ETH <10 11/23/2021   ETH <10 03/88/8280    Metabolic Disorder Labs: Lab Results  Component Value Date   HGBA1C 6.7 (H) 12/28/2021   MPG 145.59 12/28/2021   MPG 154.2 10/04/2021   Lab Results  Component Value Date   PROLACTIN 3.8 (L) 11/23/2021   Lab Results  Component Value Date   CHOL 171 11/23/2021   TRIG 125 11/23/2021   HDL 65 11/23/2021   CHOLHDL 2.6 11/23/2021   VLDL 25 11/23/2021   81 11/23/2021   [place] 95 10/04/2021    Therapeutic Lab Levels: No results found for: "LITHIUM" Lab Results  Component Value Date   VALPROATE 33 (L) 12/28/2021   VALPROATE 54 12/03/2021   No results found for: "CBMZ"  Physical Findings   PHQ2-9    [place] [facility] from 11/23/2021 in [facility]  PHQ-2 Total Score 2  PHQ-9 Total Score 3      Flowsheet Row ED from 11/23/2021 in [facility] Most recent reading at 11/23/2021  6:44 PM ED from 11/22/2021 in [place] [facility] Most recent reading at 11/22/2021  8:28 PM ED from 11/22/2021 in [facility] Most recent reading at 11/22/2021  1:24 AM  C-SSRS RISK CATEGORY No Risk No Risk No Risk        Musculoskeletal  Strength & Muscle Tone: within normal limits Gait & Station: normal Patient leans:  N/A  Psychiatric Specialty Exam  Presentation  General Appearance:  Appropriate for Environment  Eye Contact: Fair  Speech: Clear and Coherent  Speech Volume: Normal  Handedness: Right   Mood and Affect  Mood: Euthymic  Affect: Flat   Thought Process  Thought Processes: Coherent  Descriptions of Associations:Intact  Orientation:Full (Time, Place and Person)  Thought Content:WDL  Diagnosis of Schizophrenia or Schizoaffective disorder in past: Yes  Duration of Psychotic Symptoms: No data recorded  Hallucinations:Hallucinations: None  Ideas of Reference:None  Suicidal Thoughts:Suicidal Thoughts: No  Homicidal Thoughts:Homicidal Thoughts: No   Sensorium  Memory: Immediate Fair  Judgment: Intact  Insight: Present   Executive Functions  Concentration: Fair  Attention Span: Fair  Recall: [employer] of Knowledge: Fair  Language: Fair   Psychomotor Activity  Psychomotor Activity: Psychomotor Activity: Normal   Assets  Assets: Communication Skills; Desire for Improvement   Sleep  Sleep: Sleep: Fair   No data recorded  Physical Exam  Physical Exam Constitutional:      General: She is not in acute distress.    Appearance: She is not ill-appearing, toxic-appearing or diaphoretic.  Eyes:     General: No scleral icterus. Pulmonary:     Effort: Pulmonary effort is normal. No respiratory distress.  Neurological:     Mental Status: She is alert and oriented to person, place, and time.  Psychiatric:        Attention and Perception: Attention and perception normal.        Mood and Affect: Mood normal. Affect is flat.        Speech: Speech normal.        Behavior: Behavior normal. Behavior is cooperative.        Thought Content: Thought content normal.    Review of Systems  Constitutional:  Negative for chills and fever.  Respiratory:  Negative for shortness of breath.   Cardiovascular:  Negative for chest pain and  palpitations.  Gastrointestinal:  Negative for abdominal pain.  Neurological:  Negative for headaches.   Blood pressure 115/86, pulse (!) 105, temperature 97.8 F (36.6 C), temperature source Oral, resp. rate 18, SpO2 94 %. There is no height or weight on file to calculate BMI.  Treatment Plan Summary: Plan Pt remains psychiatrically cleared. Disposition is pending placement.  Tharon Aquas, NP 01/07/2022 10:54 AM

## 2022-01-07 NOTE — ED Notes (Signed)
Patient resting at this time with no sxs of distress noted - will continue to monitor for safety  

## 2022-01-07 NOTE — ED Notes (Signed)
Called [facility] for lab pick up

## 2022-01-07 NOTE — ED Notes (Signed)
Pt accepted po medications and insulin without difficulty. Interacts with staff and other patients well. Currently resting on pull out chair. Ambulates without difficulty on unit. Safety maintained and will continue to monitor.

## 2022-01-07 NOTE — Progress Notes (Signed)
Patient's R sock removed and foot inspected for possible areas causing reported pain.  The foot is not swollen, no redness, not tender to touch, not hot.  Patient ambulated on foot without gait affected.  Rolled up blanket placed under R ankle for comfort measures and patient requested to keep foot elevated over night.    Patient does have old injury to R foot prior to being admitted to this facility and has reported that tylenol does not seem to be helping with her pain.  Provider has provided new orders for lab draw and tramadol for the moderate reported pain.

## 2022-01-07 NOTE — ED Notes (Signed)
Snacks given 

## 2022-01-08 DIAGNOSIS — F209 Schizophrenia, unspecified: Secondary | ICD-10-CM | POA: Diagnosis not present

## 2022-01-08 LAB — GLUCOSE, CAPILLARY
Glucose-Capillary: 118 mg/dL — ABNORMAL HIGH (ref 70–99)
Glucose-Capillary: 118 mg/dL — ABNORMAL HIGH (ref 70–99)
Glucose-Capillary: 205 mg/dL — ABNORMAL HIGH (ref 70–99)
Glucose-Capillary: 112 mg/dL — ABNORMAL HIGH (ref 70–99)

## 2022-01-08 NOTE — ED Notes (Signed)
Lunch given.

## 2022-01-08 NOTE — ED Provider Notes (Signed)
Behavioral Health Progress Note  Date and Time: 01/08/2022 8:24 AM Name: Teresa Murillo MRN:  [id number]  Subjective:   Teresa Murillo is a 61 y.o. female with a past psychiatric history of schizophrenia, aggressive behavior, and possible intellectual disability presenting to [facility] on 11/23/21 voluntarily as a walk in via [facility] with complaints that she was locked out of the group home. Patient has been dismissed from group home due to multiple elopements. Pt had already been discharged from this facility but had been living there temporarily while DSS was looking for new placement. Pt is boarding at [facility].   AID to Capacity Evaluation (ACE) completed by Dr. Louis Murillo, Dr. Dwyane Murillo on 12/11/2021.  Please see media tab for full details.    Teresa Harman, PhD completed: Wechsler Adult Intelligence Scale-4, Teresa Murillo achieved a full-scale IQ score of 73 and a percentile Murillo of 4 placing her in the borderline range of intellectual functioning (12/14/2021) Please see consult note from Teresa Harman, PhD on 12/14/2021   On reassessment, reports feeling "alright, but a little sad" this morning. When asked further about this, pt reports she does not know why she is feeling this way. Reviewed w/ pt staff are continuing to work on disposition. Pt verbalizes understanding. Pt denies SI/VI/HI, AVH, paranoia. She denies any physical complaints. Pt remains psychiatrically cleared.   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 On the Wechsler Adult Intelligence Scale-4, Teresa Murillo achieved a full-scale IQ score of 73 and a  percentile Murillo of 4 placing her in the borderline range of intellectual functioning.    Chronic obstructive pulmonary disease (COPD) (HCC)    Glaucoma    Hyperlipidemia    Hypertension    Iron deficiency    Schizoaffective disorder (HCC)    Type 2 diabetes mellitus (HCC)     Past Surgical History:  Procedure Laterality Date   TUBAL LIGATION     Family History:  Family History  Problem Relation Age of Onset   Breast cancer Maternal Grandmother    Family Psychiatric  History: None reported Social History:  Social History   Substance and Sexual Activity  Alcohol Use Yes     Social History   Substance and Sexual Activity  Drug Use Not Currently    Social History   Socioeconomic History   Marital status: Divorced    Spouse name: Not on file   Number of children: Not on file   Years of education: Not on file   Highest education level: Not on file  Occupational History   Not on file  Tobacco Use   Smoking status: Every Day    Packs/day: 1.00    Types: Cigars, Cigarettes   Smokeless tobacco: Current  Vaping Use   Vaping Use: Never used  Substance and Sexual Activity   Alcohol use: Yes   Drug use: Not Currently   Sexual activity: Not Currently    Birth control/protection: Surgical  Other Topics Concern   Not on file  Social History Narrative   Not on file   Social Determinants of Health   Financial Resource Strain: Not on file  Food Insecurity: Not on file  Transportation Needs: Not  on file  Physical Activity: Not on file  Stress: Not on file  Social Connections: Not on file   SDOH:  SDOH Screenings   Depression (PHQ2-9): Low Risk  (11/23/2021)  Tobacco Use: High Risk (12/30/2021)   Additional Social History:    Pain Medications: See MAR Prescriptions: See MAR Over the Counter: See MAR History of alcohol / drug use?: No history of alcohol / drug abuse Longest period of sobriety (when/how long): N/A                    Sleep:  Good  Appetite:  Good  Current Medications:  Current Facility-Administered Medications  Medication Dose Route Frequency Provider Last Rate Last Admin   acetaminophen (TYLENOL) tablet 650 mg  650 mg Oral Q6H PRN Rankin, Shuvon B, NP   650 mg at 01/06/22 2245   albuterol (VENTOLIN HFA) 108 (90 Base) MCG/ACT inhaler 2 puff  2 puff Inhalation Q6H PRN Rankin, Shuvon B, NP   2 puff at 12/09/21 2102   alum & mag hydroxide-simeth (MAALOX/MYLANTA) 200-200-20 MG/5ML suspension 30 mL  30 mL Oral Q4H PRN Rankin, Shuvon B, NP   30 mL at 12/19/21 2109   apixaban (ELIQUIS) tablet 5 mg  5 mg Oral BID Rankin, Shuvon B, NP   5 mg at 01/07/22 2114   cloZAPine (CLOZARIL) tablet 50 mg  50 mg Oral Daily Teresa Georges, NP   50 mg at 01/07/22 0934   cloZAPine (CLOZARIL) tablet 75 mg  75 mg Oral Teresa Rank, NP   75 mg at 01/07/22 2113   diltiazem (CARDIZEM CD) 24 hr capsule 240 mg  240 mg Oral Daily Rankin, Shuvon B, NP   240 mg at 01/07/22 0937   divalproex (DEPAKOTE ER) 24 hr tablet 500 mg  500 mg Oral BID Rankin, Shuvon B, NP   500 mg at 01/07/22 2114   fluticasone furoate-vilanterol (BREO ELLIPTA) 200-25 MCG/ACT 1 puff  1 puff Inhalation Daily Rankin, Shuvon B, NP   1 puff at 01/08/22 0808   haloperidol (HALDOL) tablet 10 mg  10 mg Oral Q8H PRN Rankin, Shuvon B, NP       insulin aspart (novoLOG) injection 0-9 Units  0-9 Units Subcutaneous TID WC Teresa Aquas, NP   1 Units at 01/07/22 1229   insulin glargine-yfgn (SEMGLEE) injection 10 Units  10 Units Subcutaneous Daily Teresa Brittle, DO   10 Units at 01/07/22 0938   latanoprost (XALATAN) 0.005 % ophthalmic solution 1 drop  1 drop Both Eyes QHS Rankin, Shuvon B, NP   1 drop at 01/07/22 2115   magnesium hydroxide (MILK OF MAGNESIA) suspension 30 mL  30 mL Oral Daily PRN Rankin, Shuvon B, NP       menthol-cetylpyridinium (CEPACOL) lozenge 3 mg  1 lozenge Oral Once Teresa Georges, NP       menthol-cetylpyridinium (CEPACOL) lozenge 3 mg  1 lozenge Oral PRN  Teresa Georges, NP   3 mg at 01/08/22 0118   metFORMIN (GLUCOPHAGE-XR) 24 hr tablet 1,000 mg  1,000 mg Oral BID WC Teresa Brittle, DO   1,000 mg at 01/08/22 9163   multivitamins with iron tablet 1 tablet  1 tablet Oral Daily Teresa Brittle, DO   1 tablet at 01/07/22 0935   nicotine (NICODERM CQ - dosed in mg/24 hr) patch 7 mg  7 mg Transdermal Daily Rankin, Shuvon B, NP   7 mg at 01/07/22 0936   polyethylene glycol (MIRALAX / GLYCOLAX) packet 17 g  17  g Oral Daily Teresa Brittle, DO   17 g at 01/07/22 4742   Vitamin D (Ergocalciferol) (DRISDOL) 1.25 MG (50000 UNIT) capsule 50,000 Units  50,000 Units Oral Q7 days Rankin, Shuvon B, NP   50,000 Units at 01/07/22 5956   Current Outpatient Medications  Medication Sig Dispense Refill   Accu-Chek Softclix Lancets lancets Use as directed up to four times daily 100 each 0   apixaban (ELIQUIS) 5 MG TABS tablet Take 1 tablet (5 mg total) by mouth 2 (two) times daily. 60 tablet 0   ARIPiprazole (ABILIFY) 10 MG tablet Take 1 tablet (10 mg total) by mouth daily. 30 tablet 0   Blood Glucose Monitoring Suppl (ACCU-CHEK GUIDE) w/Device KIT Use as directed up to four times daily 1 kit 0   budesonide-formoterol (SYMBICORT) 160-4.5 MCG/ACT inhaler Inhale 2 puffs into the lungs in the morning and at bedtime.     Cholecalciferol (VITAMIN D3) 1.25 MG (50000 UT) CAPS Take 50,000 Units by mouth every Thursday.     cloZAPine (CLOZARIL) 25 MG tablet Take 3 tablets (75 mg total) by mouth at bedtime. 90 tablet 0   clozapine (CLOZARIL) 50 MG tablet Take 1 tablet (50 mg total) by mouth daily. 30 tablet 0   diltiazem (CARDIZEM CD) 240 MG 24 hr capsule Take 1 capsule (240 mg total) by mouth daily. (Patient not taking: Reported on 11/24/2021) 30 capsule 0   divalproex (DEPAKOTE ER) 500 MG 24 hr tablet Take 1 tablet (500 mg total) by mouth 2 (two) times daily. 60 tablet 0   glucose blood test strip Use as directed up to four times daily 50 each 0   haloperidol (HALDOL) 10 MG tablet  Take 1 tablet (10 mg total) by mouth 3 (three) times daily as needed for agitation (and psychotic symptoms).     INGREZZA 40 MG capsule Take 1 capsule (40 mg total) by mouth in the morning. 30 capsule 0   insulin aspart (NOVOLOG) 100 UNIT/ML FlexPen Before each meal 3 times a day, 140-199 - 2 units, 200-250 - 4 units, 251-299 - 6 units,  300-349 - 8 units,  350 or above 10 units. Insulin PEN if approved, provide syringes and needles if needed.Please switch to any approved short acting Insulin if needed. 15 mL 0   insulin glargine (LANTUS) 100 UNIT/ML Solostar Pen Inject 12 Units into the skin daily. 15 mL 0   Insulin Pen Needle 32G X 4 MM MISC Use 4 times a day with insulin, 1 month supply. 100 each 0   latanoprost (XALATAN) 0.005 % ophthalmic solution Place 1 drop into both eyes at bedtime.     metFORMIN (GLUCOPHAGE) 500 MG tablet Take 500 mg by mouth 2 (two) times daily with a meal.     nicotine (NICODERM CQ - DOSED IN MG/24 HR) 7 mg/24hr patch Place 1 patch (7 mg total) onto the skin daily. 28 patch 0   PROAIR HFA 108 (90 Base) MCG/ACT inhaler Inhale 2 puffs into the lungs every 6 (six) hours as needed for wheezing or shortness of breath.      Labs  Lab Results:  No results displayed because visit has over 200 results.    Admission on 11/22/2021, Discharged on 11/22/2021  Component Date Value Ref Range Status   Glucose-Capillary 11/22/2021 169 (H)  70 - 99 mg/dL Final   Glucose reference range applies only to samples taken after fasting for at least 8 hours.  No results displayed because visit has over 200 results.  Admission on 10/04/2021, Discharged on 10/22/2021  Component Date Value Ref Range Status   SARS Coronavirus 2 by RT PCR 10/04/2021 NEGATIVE  NEGATIVE Final   Comment: (NOTE) SARS-CoV-2 target nucleic acids are NOT DETECTED.  The SARS-CoV-2 RNA is generally detectable in upper respiratory specimens during the acute phase of infection. The lowest concentration of  SARS-CoV-2 viral copies this assay can detect is 138 copies/mL. A negative result does not preclude SARS-Cov-2 infection and should not be used as the sole basis for treatment or other patient management decisions. A negative result may occur with  improper specimen collection/handling, submission of specimen other than nasopharyngeal swab, presence of viral mutation(s) within the areas targeted by this assay, and inadequate number of viral copies(<138 copies/mL). A negative result must be combined with clinical observations, patient history, and epidemiological information. The expected result is Negative.  Fact Sheet for Patients:  EntrepreneurPulse.com.au  Fact Sheet for Healthcare Providers:  IncredibleEmployment.be  This test is no                          t yet approved or cleared by the [place] FDA and  has been authorized for detection and/or diagnosis of SARS-CoV-2 by FDA under an Emergency Use Authorization (EUA). This EUA will remain  in effect (meaning this test can be used) for the duration of the COVID-19 declaration under Section 564(Murillo)(1) of the Act, 21 U.S.C.section 360bbb-3(Murillo)(1), unless the authorization is terminated  or revoked sooner.       Influenza A by PCR 10/04/2021 NEGATIVE  NEGATIVE Final   Influenza Murillo by PCR 10/04/2021 NEGATIVE  NEGATIVE Final   Comment: (NOTE) The Xpert Xpress SARS-CoV-2/FLU/RSV plus assay is intended as an aid in the diagnosis of influenza from Nasopharyngeal swab specimens and should not be used as a sole basis for treatment. Nasal washings and aspirates are unacceptable for Xpert Xpress SARS-CoV-2/FLU/RSV testing.  Fact Sheet for Patients: EntrepreneurPulse.com.au  Fact Sheet for Healthcare Providers: IncredibleEmployment.be  This test is not yet approved or cleared by the [place] FDA and has been authorized for detection and/or diagnosis of  SARS-CoV-2 by FDA under an Emergency Use Authorization (EUA). This EUA will remain in effect (meaning this test can be used) for the duration of the COVID-19 declaration under Section 564(Murillo)(1) of the Act, 21 U.S.C. section 360bbb-3(Murillo)(1), unless the authorization is terminated or revoked.  Performed at [facility], [place] [address], [place], [place] [address]    WBC 10/04/2021 8.4  4.0 - 10.5 K/uL Final   RBC 10/04/2021 4.42  3.87 - 5.11 MIL/uL Final   Hemoglobin 10/04/2021 11.6 (L)  12.0 - 15.0 g/dL Final   HCT 10/04/2021 35.7 (L)  36.0 - 46.0 % Final   MCV 10/04/2021 80.8  80.0 - 100.0 fL Final   MCH 10/04/2021 26.2  26.0 - 34.0 pg Final   MCHC 10/04/2021 32.5  30.0 - 36.0 g/dL Final   RDW 10/04/2021 16.0 (H)  11.5 - 15.5 % Final   Platelets 10/04/2021 372  150 - 400 K/uL Final   nRBC 10/04/2021 0.0  0.0 - 0.2 % Final   Neutrophils Relative % 10/04/2021 68  % Final   Neutro Abs 10/04/2021 5.7  1.7 - 7.7 K/uL Final   Lymphocytes Relative 10/04/2021 22  % Final   Lymphs Abs 10/04/2021 1.8  0.7 - 4.0 K/uL Final   Monocytes Relative 10/04/2021 8  % Final   Monocytes Absolute 10/04/2021 0.7  0.1 - 1.0 K/uL Final   Eosinophils Relative 10/04/2021 1  % Final   Eosinophils Absolute 10/04/2021 0.1  0.0 - 0.5 K/uL Final   Basophils Relative 10/04/2021 1  % Final   Basophils Absolute 10/04/2021 0.1  0.0 - 0.1 K/uL Final   Immature Granulocytes 10/04/2021 0  % Final   Abs Immature Granulocytes 10/04/2021 0.03  0.00 - 0.07 K/uL Final   Performed at [facility], [place] [address] [place], [place] [address]   Sodium 10/04/2021 136  135 - 145 mmol/L Final   Potassium 10/04/2021 4.2  3.5 - 5.1 mmol/L Final   Chloride 10/04/2021 104  98 - 111 mmol/L Final   CO2 10/04/2021 25  22 - 32 mmol/L Final   Glucose, Bld 10/04/2021 100 (H)  70 - 99 mg/dL Final   Glucose reference range applies only to samples taken after fasting for at least 8 hours.   BUN 10/04/2021 9  6 - 20 mg/dL Final    Creatinine, Ser 10/04/2021 0.52  0.44 - 1.00 mg/dL Final   Calcium 10/04/2021 9.0  8.9 - 10.3 mg/dL Final   Total Protein 10/04/2021 7.0  6.5 - 8.1 g/dL Final   Albumin 10/04/2021 3.2 (L)  3.5 - 5.0 g/dL Final   AST 10/04/2021 13 (L)  15 - 41 U/L Final   ALT 10/04/2021 10  0 - 44 U/L Final   Alkaline Phosphatase 10/04/2021 61  38 - 126 U/L Final   Total Bilirubin 10/04/2021 0.3  0.3 - 1.2 mg/dL Final   GFR, Estimated 10/04/2021 >60  >60 mL/min Final   Comment: (NOTE) Calculated using the CKD-EPI Creatinine Equation (2021)    Anion gap 10/04/2021 7  5 - 15 Final   Performed at [facility] [address], [place], [place] [address]   Hgb A1c MFr Bld 10/04/2021 7.0 (H)  4.8 - 5.6 % Final   Comment: (NOTE) Pre diabetes:          5.7%-6.4%  Diabetes:              >6.4%  Glycemic control for   <7.0% adults with diabetes    Mean Plasma Glucose 10/04/2021 154.2  mg/dL Final   Performed at [facility], [place] [address], [place], [place] [address]   Cholesterol 10/04/2021 178  0 - 200 mg/dL Final   Triglycerides 10/04/2021 155 (H)  <150 mg/dL Final   HDL 10/04/2021 52  >40 mg/dL Final   Total CHOL/HDL Ratio 10/04/2021 3.4  RATIO Final   VLDL 10/04/2021 31  0 - 40 mg/dL Final   LDL Cholesterol 10/04/2021 95  0 - 99 mg/dL Final   Comment:        Total Cholesterol/HDL:CHD Risk Coronary Heart Disease Risk Table                     Men   Women  1/2 Average Risk   3.4   3.3  Average Risk       5.0   4.4  2 X Average Risk   9.6   7.1  3 X Average Risk  23.4   11.0        Use the calculated Patient Ratio above and the CHD Risk Table to determine the patient's CHD Risk.        ATP III CLASSIFICATION (LDL):  <100     mg/dL   Optimal  100-129  mg/dL   Near or Above  Optimal  130-159  mg/dL   Borderline  160-189  mg/dL   High  >190     mg/dL   Very High Performed at [facility] [address] [place], [place] [address]    POC Amphetamine UR  10/04/2021 None Detected  NONE DETECTED (Cut Off Level 1000 ng/mL) Final   POC Secobarbital (BAR) 10/04/2021 None Detected  NONE DETECTED (Cut Off Level 300 ng/mL) Final   POC Buprenorphine (BUP) 10/04/2021 None Detected  NONE DETECTED (Cut Off Level 10 ng/mL) Final   POC Oxazepam (BZO) 10/04/2021 None Detected  NONE DETECTED (Cut Off Level 300 ng/mL) Final   POC Cocaine UR 10/04/2021 None Detected  NONE DETECTED (Cut Off Level 300 ng/mL) Final   POC Methamphetamine UR 10/04/2021 None Detected  NONE DETECTED (Cut Off Level 1000 ng/mL) Final   POC Morphine 10/04/2021 None Detected  NONE DETECTED (Cut Off Level 300 ng/mL) Final   POC Methadone UR 10/04/2021 None Detected  NONE DETECTED (Cut Off Level 300 ng/mL) Final   POC Oxycodone UR 10/04/2021 Positive (A)  NONE DETECTED (Cut Off Level 100 ng/mL) Final   POC Marijuana UR 10/04/2021 None Detected  NONE DETECTED (Cut Off Level 50 ng/mL) Final   SARSCOV2ONAVIRUS 2 AG 10/04/2021 NEGATIVE  NEGATIVE Final   Comment: (NOTE) SARS-CoV-2 antigen NOT DETECTED.   Negative results are presumptive.  Negative results do not preclude SARS-CoV-2 infection and should not be used as the sole basis for treatment or other patient management decisions, including infection  control decisions, particularly in the presence of clinical signs and  symptoms consistent with COVID-19, or in those who have been in contact with the virus.  Negative results must be combined with clinical observations, patient history, and epidemiological information. The expected result is Negative.  Fact Sheet for Patients: HandmadeRecipes.com.cy  Fact Sheet for Healthcare Providers: FuneralLife.at  This test is not yet approved or cleared by the [place] FDA and  has been authorized for detection and/or diagnosis of SARS-CoV-2 by FDA under an Emergency Use Authorization (EUA).  This EUA will remain in effect (meaning this test can  be used) for the duration of  the COV                          ID-19 declaration under Section 564(Murillo)(1) of the Act, 21 U.S.C. section 360bbb-3(Murillo)(1), unless the authorization is terminated or revoked sooner.     Valproic Acid Lvl 10/04/2021 51  50.0 - 100.0 ug/mL Final   Performed at [facility] [address] [place], [place] [address]   Valproic Acid Lvl 10/08/2021 57  50.0 - 100.0 ug/mL Final   Performed at [facility] [address] [place], [place] [address]   Glucose-Capillary 10/09/2021 123 (H)  70 - 99 mg/dL Final   Glucose reference range applies only to samples taken after fasting for at least 8 hours.  Admission on 09/09/2021, Discharged on 09/10/2021  Component Date Value Ref Range Status   Sodium 09/09/2021 134 (L)  135 - 145 mmol/L Final   Potassium 09/09/2021 4.3  3.5 - 5.1 mmol/L Final   Chloride 09/09/2021 99  98 - 111 mmol/L Final   CO2 09/09/2021 25  22 - 32 mmol/L Final   Glucose, Bld 09/09/2021 107 (H)  70 - 99 mg/dL Final   Glucose reference range applies only to samples taken after fasting for at least 8 hours.   BUN 09/09/2021 12  6 - 20 mg/dL Final   Creatinine, Ser 09/09/2021 0.74  0.44 - 1.00 mg/dL Final   Calcium 09/09/2021 9.0  8.9 - 10.3 mg/dL Final   Total Protein 09/09/2021 7.4  6.5 - 8.1 g/dL Final   Albumin 09/09/2021 3.1 (L)  3.5 - 5.0 g/dL Final   AST 09/09/2021 13 (L)  15 - 41 U/L Final   ALT 09/09/2021 13  0 - 44 U/L Final   Alkaline Phosphatase 09/09/2021 66  38 - 126 U/L Final   Total Bilirubin 09/09/2021 0.2 (L)  0.3 - 1.2 mg/dL Final   GFR, Estimated 09/09/2021 >60  >60 mL/min Final   Comment: (NOTE) Calculated using the CKD-EPI Creatinine Equation (2021)    Anion gap 09/09/2021 10  5 - 15 Final   Performed at [facility], [place] [address], [place], [place] [address]   WBC 09/09/2021 8.5  4.0 - 10.5 K/uL Final   RBC 09/09/2021 4.53  3.87 - 5.11 MIL/uL Final   Hemoglobin 09/09/2021 11.8 (L)  12.0 - 15.0 g/dL Final    HCT 09/09/2021 38.0  36.0 - 46.0 % Final   MCV 09/09/2021 83.9  80.0 - 100.0 fL Final   MCH 09/09/2021 26.0  26.0 - 34.0 pg Final   MCHC 09/09/2021 31.1  30.0 - 36.0 g/dL Final   RDW 09/09/2021 17.8 (H)  11.5 - 15.5 % Final   Platelets 09/09/2021 442 (H)  150 - 400 K/uL Final   nRBC 09/09/2021 0.0  0.0 - 0.2 % Final   Neutrophils Relative % 09/09/2021 70  % Final   Neutro Abs 09/09/2021 5.9  1.7 - 7.7 K/uL Final   Lymphocytes Relative 09/09/2021 20  % Final   Lymphs Abs 09/09/2021 1.7  0.7 - 4.0 K/uL Final   Monocytes Relative 09/09/2021 8  % Final   Monocytes Absolute 09/09/2021 0.7  0.1 - 1.0 K/uL Final   Eosinophils Relative 09/09/2021 1  % Final   Eosinophils Absolute 09/09/2021 0.1  0.0 - 0.5 K/uL Final   Basophils Relative 09/09/2021 1  % Final   Basophils Absolute 09/09/2021 0.1  0.0 - 0.1 K/uL Final   Immature Granulocytes 09/09/2021 0  % Final   Abs Immature Granulocytes 09/09/2021 0.03  0.00 - 0.07 K/uL Final   Performed at [facility], [place] [address], [place], [place] [address]   Ammonia 09/09/2021 37 (H)  9 - 35 umol/L Final   Comment: HEMOLYSIS AT THIS LEVEL MAY AFFECT RESULT Performed at [facility], [place] [address], [place], [place] [address]    Opiates 09/09/2021 NONE DETECTED  NONE DETECTED Final   Cocaine 09/09/2021 NONE DETECTED  NONE DETECTED Final   Benzodiazepines 09/09/2021 NONE DETECTED  NONE DETECTED Final   Amphetamines 09/09/2021 NONE DETECTED  NONE DETECTED Final   Tetrahydrocannabinol 09/09/2021 NONE DETECTED  NONE DETECTED Final   Barbiturates 09/09/2021 NONE DETECTED  NONE DETECTED Final   Comment: (NOTE) DRUG SCREEN FOR MEDICAL PURPOSES ONLY.  IF CONFIRMATION IS NEEDED FOR ANY PURPOSE, NOTIFY LAB WITHIN 5 DAYS.  LOWEST DETECTABLE LIMITS FOR URINE DRUG SCREEN Drug Class                     Cutoff (ng/mL) Amphetamine and metabolites    1000 Barbiturate and metabolites    200 Benzodiazepine                 710 Tricyclics and  metabolites     300 Opiates and metabolites  300 Cocaine and metabolites        300 THC                            50 Performed at [facility], [place] [address], [place], [place] [address]    Alcohol, Ethyl (Murillo) 09/09/2021 <10  <10 mg/dL Final   Comment: (NOTE) Lowest detectable limit for serum alcohol is 10 mg/dL.  For medical purposes only. Performed at [facility], [place] [address], [place], [place] [address]    Lipase 09/09/2021 33  11 - 51 U/L Final   Performed at [facility] [address], [place], [place] [address]   Color, Urine 09/09/2021 COLORLESS (A)  YELLOW Final   APPearance 09/09/2021 CLEAR  CLEAR Final   Specific Gravity, Urine 09/09/2021 1.002 (L)  1.005 - 1.030 Final   pH 09/09/2021 6.0  5.0 - 8.0 Final   Glucose, UA 09/09/2021 NEGATIVE  NEGATIVE mg/dL Final   Hgb urine dipstick 09/09/2021 NEGATIVE  NEGATIVE Final   Bilirubin Urine 09/09/2021 NEGATIVE  NEGATIVE Final   Ketones, ur 09/09/2021 NEGATIVE  NEGATIVE mg/dL Final   Protein, ur 09/09/2021 NEGATIVE  NEGATIVE mg/dL Final   Nitrite 09/09/2021 NEGATIVE  NEGATIVE Final   Leukocytes,Ua 09/09/2021 NEGATIVE  NEGATIVE Final   Performed at [facility] [address] [place], [place] [address]   Specimen Description 09/09/2021 URINE, CLEAN CATCH   Final   Special Requests 09/09/2021 NONE   Final   Culture 09/09/2021  (A)   Final                   Value:<10,000 COLONIES/mL INSIGNIFICANT GROWTH Performed at [facility], [place] [address], [place], [place] [address]    Report Status 09/09/2021 09/10/2021 FINAL   Final   D-Dimer, Quant 09/09/2021 0.92 (H)  0.00 - 0.50 ug/mL-FEU Final   Comment: (NOTE) At the manufacturer cut-off value of 0.5 g/mL FEU, this assay has a negative predictive value of 95-100%.This assay is intended for use in conjunction with a clinical pretest probability (PTP) assessment model to exclude pulmonary embolism (PE) and deep venous thrombosis (DVT) in  outpatients suspected of PE or DVT. Results should be correlated with clinical presentation. Performed at [facility], [place] [address], [place], [place] [address]    Troponin I (High Sensitivity) 09/09/2021 5  <18 ng/L Final   Comment: (NOTE) Elevated high sensitivity troponin I (hsTnI) values and significant  changes across serial measurements may suggest ACS but many other  chronic and acute conditions are known to elevate hsTnI results.  Refer to the "Links" section for chest pain algorithms and additional  guidance. Performed at [facility], [place] [address], [place], [place] [address]    BP 09/10/2021 135/83  mmHg Final   S' Lateral 09/10/2021 2.30  cm Final   Area-P 1/2 09/10/2021 5.58  cm2 Final    Blood Alcohol level:  Lab Results  Component Value Date   ETH <10 11/23/2021   ETH <10 03/09/3233    Metabolic Disorder Labs: Lab Results  Component Value Date   HGBA1C 6.7 (H) 12/28/2021   MPG 145.59 12/28/2021   MPG 154.2 10/04/2021   Lab Results  Component Value Date   PROLACTIN 3.8 (L) 11/23/2021   Lab Results  Component Value Date   CHOL 171 11/23/2021   TRIG 125 11/23/2021   HDL 65 11/23/2021  CHOLHDL 2.6 11/23/2021   VLDL 25 11/23/2021   LDLCALC 81 11/23/2021   LDLCALC 95 10/04/2021    Therapeutic Lab Levels: No results found for: "LITHIUM" Lab Results  Component Value Date   VALPROATE 33 (L) 12/28/2021   VALPROATE 54 12/03/2021   No results found for: "CBMZ"  Physical Findings   PHQ2-9    [place] [facility] from 11/23/2021 in [facility]  PHQ-2 Total Score 2  PHQ-9 Total Score 3      Flowsheet Row ED from 11/23/2021 in [facility] Most recent reading at 11/23/2021  6:44 PM ED from 11/22/2021 in [place] [facility] Most recent reading at 11/22/2021  8:28 PM ED from 11/22/2021 in [place] [facility] Most recent reading at  11/22/2021  1:24 AM  C-SSRS RISK CATEGORY No Risk No Risk No Risk        Musculoskeletal  Strength & Muscle Tone: within normal limits Gait & Station: normal Patient leans: N/A  Psychiatric Specialty Exam  Presentation  General Appearance:  Appropriate for Environment  Eye Contact: Fair  Speech: Clear and Coherent  Speech Volume: Normal  Handedness: Right   Mood and Affect  Mood: -- ("alright, but a little sad")  Affect: Flat   Thought Process  Thought Processes: Coherent  Descriptions of Associations:Intact  Orientation:Full (Time, Place and Person)  Thought Content:WDL  Diagnosis of Schizophrenia or Schizoaffective disorder in past: Yes  Duration of Psychotic Symptoms: No data recorded  Hallucinations:Hallucinations: None  Ideas of Reference:None  Suicidal Thoughts:Suicidal Thoughts: No  Homicidal Thoughts:Homicidal Thoughts: No   Sensorium  Memory: Immediate Fair  Judgment: Intact  Insight: Present   Executive Functions  Concentration: Fair  Attention Span: Fair  Recall: [employer] of Knowledge: Fair  Language: Fair   Psychomotor Activity  Psychomotor Activity: Psychomotor Activity: Normal   Assets  Assets: Communication Skills; Desire for Improvement   Sleep  Sleep: Sleep: Fair   No data recorded  Physical Exam  Physical Exam Constitutional:      General: She is not in acute distress.    Appearance: She is not ill-appearing, toxic-appearing or diaphoretic.  Eyes:     General: No scleral icterus. Pulmonary:     Effort: Pulmonary effort is normal. No respiratory distress.  Neurological:     Mental Status: She is alert and oriented to person, place, and time.  Psychiatric:        Attention and Perception: Attention and perception normal.        Mood and Affect: Affect is flat.        Speech: Speech normal.        Behavior: Behavior normal. Behavior is cooperative.        Thought Content: Thought  content normal.    Review of Systems  Constitutional:  Negative for chills and fever.  Respiratory:  Negative for shortness of breath.   Cardiovascular:  Negative for chest pain and palpitations.  Gastrointestinal:  Negative for abdominal pain.  Neurological:  Negative for headaches.   Blood pressure 121/68, pulse (!) 104, temperature 98.9 F (37.2 C), temperature source Oral, resp. rate 18, SpO2 99 %. There is no height or weight on file to calculate BMI.  Treatment Plan Summary: Plan Pt remains psychiatrically cleared. Disposition is pending.   Teresa Aquas, NP 01/08/2022 8:24 AM

## 2022-01-08 NOTE — ED Notes (Signed)
Pt is sleeping. No distress noted. Will continue to monitor safety. 

## 2022-01-08 NOTE — ED Notes (Signed)
Pt sleeping in recliner bed. No noted distress. Will continue to monitor for safety 

## 2022-01-08 NOTE — ED Notes (Signed)
Patient observed/assessed in bed/chair resting quietly appearing with no distress and verbalizing no complaints at this time. Will continue to monitor.  

## 2022-01-08 NOTE — ED Notes (Signed)
Pt awake voices c/o sore throat. Requesting cough lozenge. PRN cough lozenge provided for pt. Will continue to monitor for safety

## 2022-01-08 NOTE — ED Notes (Signed)
Pt is in the bed watching TV. Respirations are even and unlabored. No acute distress noted. Will continue to monitor for safety.  

## 2022-01-08 NOTE — ED Notes (Signed)
Pt given sandwich for dinner.

## 2022-01-08 NOTE — ED Notes (Signed)
Pt resting quietly. Breathing even and unlabored.  

## 2022-01-08 NOTE — Progress Notes (Signed)
LCSW Progress Note   1141 - LCSW sent an email to Ms. Denton Lank at [facility] requesting an update regarding any movement towards additional funding for the pt so she can be transferred into her new group home.  At this time, [facility] is closed in observance of Veteran's Day so no updates will be given until 11 January 2022 at the earliest.   Hansel Starling, MSW, LCSW [facility] [phone] phone [phone] fax

## 2022-01-08 NOTE — ED Notes (Signed)
  PT is awake and alert. she has been given cereal for breakfast. Is currently eating in no distress.   Awaiting provider eval.

## 2022-01-09 DIAGNOSIS — F209 Schizophrenia, unspecified: Secondary | ICD-10-CM | POA: Diagnosis not present

## 2022-01-09 LAB — GLUCOSE, CAPILLARY
Glucose-Capillary: 122 mg/dL — ABNORMAL HIGH (ref 70–99)
Glucose-Capillary: 148 mg/dL — ABNORMAL HIGH (ref 70–99)
Glucose-Capillary: 166 mg/dL — ABNORMAL HIGH (ref 70–99)
Glucose-Capillary: 211 mg/dL — ABNORMAL HIGH (ref 70–99)

## 2022-01-09 NOTE — ED Notes (Signed)
Patient med compliant and ate breakfast. Patient denies SI,HI, and A/V/H with no plan/intent. Denies any pain. Patient remains safe and cooperative on unit.

## 2022-01-09 NOTE — ED Notes (Signed)
Pt sleeping at present, no distress noted.  Monitoring for safety. 

## 2022-01-09 NOTE — ED Notes (Signed)
Pt resting quietly, breathing is even and unlabored. She is interacting with others on the unit and watching a movie.  Pt denies SI, HI, pain and AVH.  Will continue to monitor for safety.

## 2022-01-09 NOTE — ED Provider Notes (Signed)
Behavioral Health Progress Note  Date and Time: 01/09/2022 10:16 AM Name: Teresa Murillo MRN:  [id number]  Subjective:    Teresa Murillo is a 61 y.o. female with a past psychiatric history of schizophrenia, aggressive behavior, and possible intellectual disability presenting to [facility] on 11/23/21 voluntarily as a walk in via [facility] with complaints that she was locked out of the group home. Patient has been dismissed from group home due to multiple elopements. Pt had already been discharged from this facility but had been living there temporarily while DSS was looking for new placement. Pt is boarding at [facility].   AID to Capacity Evaluation (ACE) completed by Dr. Louis Meckel, Dr. Dwyane Dee on 12/11/2021.  Please see media tab for full details.    Teresa Murillo completed: Wechsler Adult Intelligence Scale-4, Teresa Murillo achieved a full-scale IQ score of 73 and a percentile rank of 4 placing her in the borderline range of intellectual functioning (12/14/2021) Please see consult note from Teresa Murillo on 12/14/2021   On reassessment pt is sitting up listening to music on my approach. She reports "alright" mood. She denies SI/VI/HI, AVH, paranoia. She reports she is spending the time coloring and has played cards with another peer on the unit. There is no evidence of internal preoccupation, agitation, aggression or distractiblity. Pt is calm, cooperative, pleasant. No delusions or paranoia elicited. Pt remains psychiatrically cleared pending placement.  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 On the Wechsler Adult  Intelligence Scale-4, Teresa Murillo achieved a full-scale IQ score of 73 and a percentile rank of 4 placing her in the borderline range of intellectual functioning.    Chronic obstructive pulmonary disease (COPD) (HCC)    Glaucoma    Hyperlipidemia    Hypertension    Iron deficiency    Schizoaffective disorder (HCC)    Type 2 diabetes mellitus (HCC)     Past Surgical History:  Procedure Laterality Date   TUBAL LIGATION     Family History:  Family History  Problem Relation Age of Onset   Breast cancer Maternal Grandmother    Family Psychiatric  History: None reported Social History:  Social History   Substance and Sexual Activity  Alcohol Use Yes     Social History   Substance and Sexual Activity  Drug Use Not Currently    Social History   Socioeconomic History   Marital status: Divorced    Spouse name: Not on file   Number of children: Not on file   Years of education: Not on file   Highest education level: Not on file  Occupational History   Not on file  Tobacco Use   Smoking status: Every Day    Packs/day: 1.00    Types: Cigars, Cigarettes   Smokeless tobacco: Current  Vaping Use   Vaping Use: Never used  Substance and Sexual Activity   Alcohol use: Yes   Drug use: Not Currently   Sexual activity: Not Currently    Birth control/protection: Surgical  Other Topics Concern   Not on file  Social History Narrative   Not on file   Social Determinants of Health   Financial Resource Strain: Not on  file  Food Insecurity: Not on file  Transportation Needs: Not on file  Physical Activity: Not on file  Stress: Not on file  Social Connections: Not on file   SDOH:  SDOH Screenings   Depression (PHQ2-9): Low Risk  (11/23/2021)  Tobacco Use: High Risk (12/30/2021)   Additional Social History:    Pain Medications: See MAR Prescriptions: See MAR Over the Counter: See MAR History of alcohol / drug use?: No history of alcohol / drug abuse Longest period of  sobriety (when/how long): N/A                    Sleep: Good  Appetite:  Good  Current Medications:  Current Facility-Administered Medications  Medication Dose Route Frequency Provider Last Rate Last Admin   acetaminophen (TYLENOL) tablet 650 mg  650 mg Oral Q6H PRN Rankin, Shuvon B, NP   650 mg at 01/09/22 0047   albuterol (VENTOLIN HFA) 108 (90 Base) MCG/ACT inhaler 2 puff  2 puff Inhalation Q6H PRN Rankin, Shuvon B, NP   2 puff at 12/09/21 2102   alum & mag hydroxide-simeth (MAALOX/MYLANTA) 200-200-20 MG/5ML suspension 30 mL  30 mL Oral Q4H PRN Rankin, Shuvon B, NP   30 mL at 12/19/21 2109   apixaban (ELIQUIS) tablet 5 mg  5 mg Oral BID Rankin, Shuvon B, NP   5 mg at 01/09/22 0904   cloZAPine (CLOZARIL) tablet 50 mg  50 mg Oral Daily [name], NP   50 mg at 01/09/22 0904   cloZAPine (CLOZARIL) tablet 75 mg  75 mg Oral Dorie Rank, NP   75 mg at 01/08/22 2120   diltiazem (CARDIZEM CD) 24 hr capsule 240 mg  240 mg Oral Daily Rankin, Shuvon B, NP   240 mg at 01/09/22 0904   divalproex (DEPAKOTE ER) 24 hr tablet 500 mg  500 mg Oral BID Rankin, Shuvon B, NP   500 mg at 01/09/22 0904   fluticasone furoate-vilanterol (BREO ELLIPTA) 200-25 MCG/ACT 1 puff  1 puff Inhalation Daily Rankin, Shuvon B, NP   1 puff at 01/09/22 0900   haloperidol (HALDOL) tablet 10 mg  10 mg Oral Q8H PRN Rankin, Shuvon B, NP       insulin aspart (novoLOG) injection 0-9 Units  0-9 Units Subcutaneous TID WC Tharon Aquas, NP   1 Units at 01/09/22 0850   insulin glargine-yfgn (SEMGLEE) injection 10 Units  10 Units Subcutaneous Daily [name], DO   10 Units at 01/09/22 0903   latanoprost (XALATAN) 0.005 % ophthalmic solution 1 drop  1 drop Both Eyes QHS Rankin, Shuvon B, NP   1 drop at 01/08/22 2122   magnesium hydroxide (MILK OF MAGNESIA) suspension 30 mL  30 mL Oral Daily PRN Rankin, Shuvon B, NP       menthol-cetylpyridinium (CEPACOL) lozenge 3 mg  1 lozenge Oral Once [name], NP        menthol-cetylpyridinium (CEPACOL) lozenge 3 mg  1 lozenge Oral PRN [name], NP   3 mg at 01/08/22 2341   metFORMIN (GLUCOPHAGE-XR) 24 hr tablet 1,000 mg  1,000 mg Oral BID WC [name], DO   1,000 mg at 01/09/22 0859   multivitamins with iron tablet 1 tablet  1 tablet Oral Daily [name], DO   1 tablet at 01/09/22 0904   nicotine (NICODERM CQ - dosed in mg/24 hr) patch 7 mg  7 mg Transdermal Daily Rankin, Shuvon B, NP   7 mg at 01/09/22 [phone]  polyethylene glycol (MIRALAX / GLYCOLAX) packet 17 g  17 g Oral Daily [name], DO   17 g at 01/09/22 8416   Vitamin D (Ergocalciferol) (DRISDOL) 1.25 MG (50000 UNIT) capsule 50,000 Units  50,000 Units Oral Q7 days Rankin, Shuvon B, NP   50,000 Units at 01/07/22 6063   Current Outpatient Medications  Medication Sig Dispense Refill   Accu-Chek Softclix Lancets lancets Use as directed up to four times daily 100 each 0   apixaban (ELIQUIS) 5 MG TABS tablet Take 1 tablet (5 mg total) by mouth 2 (two) times daily. 60 tablet 0   ARIPiprazole (ABILIFY) 10 MG tablet Take 1 tablet (10 mg total) by mouth daily. 30 tablet 0   Blood Glucose Monitoring Suppl (ACCU-CHEK GUIDE) w/Device KIT Use as directed up to four times daily 1 kit 0   budesonide-formoterol (SYMBICORT) 160-4.5 MCG/ACT inhaler Inhale 2 puffs into the lungs in the morning and at bedtime.     Cholecalciferol (VITAMIN D3) 1.25 MG (50000 UT) CAPS Take 50,000 Units by mouth every Thursday.     cloZAPine (CLOZARIL) 25 MG tablet Take 3 tablets (75 mg total) by mouth at bedtime. 90 tablet 0   clozapine (CLOZARIL) 50 MG tablet Take 1 tablet (50 mg total) by mouth daily. 30 tablet 0   diltiazem (CARDIZEM CD) 240 MG 24 hr capsule Take 1 capsule (240 mg total) by mouth daily. (Patient not taking: Reported on 11/24/2021) 30 capsule 0   divalproex (DEPAKOTE ER) 500 MG 24 hr tablet Take 1 tablet (500 mg total) by mouth 2 (two) times daily. 60 tablet 0   glucose blood test strip Use as directed up to  four times daily 50 each 0   haloperidol (HALDOL) 10 MG tablet Take 1 tablet (10 mg total) by mouth 3 (three) times daily as needed for agitation (and psychotic symptoms).     INGREZZA 40 MG capsule Take 1 capsule (40 mg total) by mouth in the morning. 30 capsule 0   insulin aspart (NOVOLOG) 100 UNIT/ML FlexPen Before each meal 3 times a day, 140-199 - 2 units, 200-250 - 4 units, 251-299 - 6 units,  300-349 - 8 units,  350 or above 10 units. Insulin PEN if approved, provide syringes and needles if needed.Please switch to any approved short acting Insulin if needed. 15 mL 0   insulin glargine (LANTUS) 100 UNIT/ML Solostar Pen Inject 12 Units into the skin daily. 15 mL 0   Insulin Pen Needle 32G X 4 MM MISC Use 4 times a day with insulin, 1 month supply. 100 each 0   latanoprost (XALATAN) 0.005 % ophthalmic solution Place 1 drop into both eyes at bedtime.     metFORMIN (GLUCOPHAGE) 500 MG tablet Take 500 mg by mouth 2 (two) times daily with a meal.     nicotine (NICODERM CQ - DOSED IN MG/24 HR) 7 mg/24hr patch Place 1 patch (7 mg total) onto the skin daily. 28 patch 0   PROAIR HFA 108 (90 Base) MCG/ACT inhaler Inhale 2 puffs into the lungs every 6 (six) hours as needed for wheezing or shortness of breath.      Labs  Lab Results:  No results displayed because visit has over 200 results.    Admission on 11/22/2021, Discharged on 11/22/2021  Component Date Value Ref Range Status   Glucose-Capillary 11/22/2021 169 (H)  70 - 99 mg/dL Final   Glucose reference range applies only to samples taken after fasting for at least 8 hours.  No results displayed because visit has over 200 results.    Admission on 10/04/2021, Discharged on 10/22/2021  Component Date Value Ref Range Status   SARS Coronavirus 2 by RT PCR 10/04/2021 NEGATIVE  NEGATIVE Final   Comment: (NOTE) SARS-CoV-2 target nucleic acids are NOT DETECTED.  The SARS-CoV-2 RNA is generally detectable in upper respiratory specimens during the  acute phase of infection. The lowest concentration of SARS-CoV-2 viral copies this assay can detect is 138 copies/mL. A negative result does not preclude SARS-Cov-2 infection and should not be used as the sole basis for treatment or other patient management decisions. A negative result may occur with  improper specimen collection/handling, submission of specimen other than nasopharyngeal swab, presence of viral mutation(s) within the areas targeted by this assay, and inadequate number of viral copies(<138 copies/mL). A negative result must be combined with clinical observations, patient history, and epidemiological information. The expected result is Negative.  Fact Sheet for Patients:  EntrepreneurPulse.com.au  Fact Sheet for Healthcare Providers:  IncredibleEmployment.be  This test is no                          t yet approved or cleared by the [place] FDA and  has been authorized for detection and/or diagnosis of SARS-CoV-2 by FDA under an Emergency Use Authorization (EUA). This EUA will remain  in effect (meaning this test can be used) for the duration of the COVID-19 declaration under Section 564(b)(1) of the Act, 21 U.S.C.section 360bbb-3(b)(1), unless the authorization is terminated  or revoked sooner.       Influenza A by PCR 10/04/2021 NEGATIVE  NEGATIVE Final   Influenza B by PCR 10/04/2021 NEGATIVE  NEGATIVE Final   Comment: (NOTE) The Xpert Xpress SARS-CoV-2/FLU/RSV plus assay is intended as an aid in the diagnosis of influenza from Nasopharyngeal swab specimens and should not be used as a sole basis for treatment. Nasal washings and aspirates are unacceptable for Xpert Xpress SARS-CoV-2/FLU/RSV testing.  Fact Sheet for Patients: EntrepreneurPulse.com.au  Fact Sheet for Healthcare Providers: IncredibleEmployment.be  This test is not yet approved or cleared by the [place] FDA  and has been authorized for detection and/or diagnosis of SARS-CoV-2 by FDA under an Emergency Use Authorization (EUA). This EUA will remain in effect (meaning this test can be used) for the duration of the COVID-19 declaration under Section 564(b)(1) of the Act, 21 U.S.C. section 360bbb-3(b)(1), unless the authorization is terminated or revoked.  Performed at [facility], [place] [address], [place], [place] [address]    WBC 10/04/2021 8.4  4.0 - 10.5 K/uL Final   RBC 10/04/2021 4.42  3.87 - 5.11 MIL/uL Final   Hemoglobin 10/04/2021 11.6 (L)  12.0 - 15.0 g/dL Final   HCT 10/04/2021 35.7 (L)  36.0 - 46.0 % Final   MCV 10/04/2021 80.8  80.0 - 100.0 fL Final   MCH 10/04/2021 26.2  26.0 - 34.0 pg Final   MCHC 10/04/2021 32.5  30.0 - 36.0 g/dL Final   RDW 10/04/2021 16.0 (H)  11.5 - 15.5 % Final   Platelets 10/04/2021 372  150 - 400 K/uL Final   nRBC 10/04/2021 0.0  0.0 - 0.2 % Final   Neutrophils Relative % 10/04/2021 68  % Final   Neutro Abs 10/04/2021 5.7  1.7 - 7.7 K/uL Final   Lymphocytes Relative 10/04/2021 22  % Final   Lymphs Abs 10/04/2021 1.8  0.7 - 4.0 K/uL Final   Monocytes Relative  10/04/2021 8  % Final   Monocytes Absolute 10/04/2021 0.7  0.1 - 1.0 K/uL Final   Eosinophils Relative 10/04/2021 1  % Final   Eosinophils Absolute 10/04/2021 0.1  0.0 - 0.5 K/uL Final   Basophils Relative 10/04/2021 1  % Final   Basophils Absolute 10/04/2021 0.1  0.0 - 0.1 K/uL Final   Immature Granulocytes 10/04/2021 0  % Final   Abs Immature Granulocytes 10/04/2021 0.03  0.00 - 0.07 K/uL Final   Performed at [facility], [place] [address] [place], [place] [address]   Sodium 10/04/2021 136  135 - 145 mmol/L Final   Potassium 10/04/2021 4.2  3.5 - 5.1 mmol/L Final   Chloride 10/04/2021 104  98 - 111 mmol/L Final   CO2 10/04/2021 25  22 - 32 mmol/L Final   Glucose, Bld 10/04/2021 100 (H)  70 - 99 mg/dL Final   Glucose reference range applies only to samples taken after fasting for at  least 8 hours.   BUN 10/04/2021 9  6 - 20 mg/dL Final   Creatinine, Ser 10/04/2021 0.52  0.44 - 1.00 mg/dL Final   Calcium 10/04/2021 9.0  8.9 - 10.3 mg/dL Final   Total Protein 10/04/2021 7.0  6.5 - 8.1 g/dL Final   Albumin 10/04/2021 3.2 (L)  3.5 - 5.0 g/dL Final   AST 10/04/2021 13 (L)  15 - 41 U/L Final   ALT 10/04/2021 10  0 - 44 U/L Final   Alkaline Phosphatase 10/04/2021 61  38 - 126 U/L Final   Total Bilirubin 10/04/2021 0.3  0.3 - 1.2 mg/dL Final   GFR, Estimated 10/04/2021 >60  >60 mL/min Final   Comment: (NOTE) Calculated using the CKD-EPI Creatinine Equation (2021)    Anion gap 10/04/2021 7  5 - 15 Final   Performed at [place] [address], [place], [place] [address]   Hgb A1c MFr Bld 10/04/2021 7.0 (H)  4.8 - 5.6 % Final   Comment: (NOTE) Pre diabetes:          5.7%-6.4%  Diabetes:              >6.4%  Glycemic control for   <7.0% adults with diabetes    Mean Plasma Glucose 10/04/2021 154.2  mg/dL Final   Performed at [facility], [place] [address] [place], [place] [address]   Cholesterol 10/04/2021 178  0 - 200 mg/dL Final   Triglycerides 10/04/2021 155 (H)  <150 mg/dL Final   HDL 10/04/2021 52  >40 mg/dL Final   Total CHOL/HDL Ratio 10/04/2021 3.4  RATIO Final   VLDL 10/04/2021 31  0 - 40 mg/dL Final   LDL Cholesterol 10/04/2021 95  0 - 99 mg/dL Final   Comment:        Total Cholesterol/HDL:CHD Risk Coronary Heart Disease Risk Table                     Men   Women  1/2 Average Risk   3.4   3.3  Average Risk       5.0   4.4  2 X Average Risk   9.6   7.1  3 X Average Risk  23.4   11.0        Use the calculated Patient Ratio above and the CHD Risk Table to determine the patient's CHD Risk.        ATP III CLASSIFICATION (LDL):  <100     mg/dL  Optimal  100-129  mg/dL   Near or Above                    Optimal  130-159  mg/dL   Borderline  160-189  mg/dL   High  >190     mg/dL   Very High Performed at [place]  [address] [place], [place] [address]    POC Amphetamine UR 10/04/2021 None Detected  NONE DETECTED (Cut Off Level 1000 ng/mL) Final   POC Secobarbital (BAR) 10/04/2021 None Detected  NONE DETECTED (Cut Off Level 300 ng/mL) Final   POC Buprenorphine (BUP) 10/04/2021 None Detected  NONE DETECTED (Cut Off Level 10 ng/mL) Final   POC Oxazepam (BZO) 10/04/2021 None Detected  NONE DETECTED (Cut Off Level 300 ng/mL) Final   POC Cocaine UR 10/04/2021 None Detected  NONE DETECTED (Cut Off Level 300 ng/mL) Final   POC Methamphetamine UR 10/04/2021 None Detected  NONE DETECTED (Cut Off Level 1000 ng/mL) Final   POC Morphine 10/04/2021 None Detected  NONE DETECTED (Cut Off Level 300 ng/mL) Final   POC Methadone UR 10/04/2021 None Detected  NONE DETECTED (Cut Off Level 300 ng/mL) Final   POC Oxycodone UR 10/04/2021 Positive (A)  NONE DETECTED (Cut Off Level 100 ng/mL) Final   POC Marijuana UR 10/04/2021 None Detected  NONE DETECTED (Cut Off Level 50 ng/mL) Final   SARSCOV2ONAVIRUS 2 AG 10/04/2021 NEGATIVE  NEGATIVE Final   Comment: (NOTE) SARS-CoV-2 antigen NOT DETECTED.   Negative results are presumptive.  Negative results do not preclude SARS-CoV-2 infection and should not be used as the sole basis for treatment or other patient management decisions, including infection  control decisions, particularly in the presence of clinical signs and  symptoms consistent with COVID-19, or in those who have been in contact with the virus.  Negative results must be combined with clinical observations, patient history, and epidemiological information. The expected result is Negative.  Fact Sheet for Patients: HandmadeRecipes.com.cy  Fact Sheet for Healthcare Providers: FuneralLife.at  This test is not yet approved or cleared by the [place] FDA and  has been authorized for detection and/or diagnosis of SARS-CoV-2 by FDA under an Emergency Use Authorization (EUA).   This EUA will remain in effect (meaning this test can be used) for the duration of  the COV                          [place]-19 declaration under Section 564(b)(1) of the Act, 21 U.S.C. section 360bbb-3(b)(1), unless the authorization is terminated or revoked sooner.     Valproic Acid Lvl 10/04/2021 51  50.0 - 100.0 ug/mL Final   Performed at [place] [address] [place], [place] [address]   Valproic Acid Lvl 10/08/2021 57  50.0 - 100.0 ug/mL Final   Performed at [place] [address] [place], [place] [address]   Glucose-Capillary 10/09/2021 123 (H)  70 - 99 mg/dL Final   Glucose reference range applies only to samples taken after fasting for at least 8 hours.  Admission on 09/09/2021, Discharged on 09/10/2021  Component Date Value Ref Range Status   Sodium 09/09/2021 134 (L)  135 - 145 mmol/L Final   Potassium 09/09/2021 4.3  3.5 - 5.1 mmol/L Final   Chloride 09/09/2021 99  98 - 111 mmol/L Final   CO2 09/09/2021 25  22 - 32 mmol/L Final   Glucose, Bld 09/09/2021 107 (H)  70 - 99 mg/dL Final   Glucose reference range applies only to samples taken after fasting for at least 8 hours.   BUN 09/09/2021 12  6 - 20 mg/dL Final   Creatinine, Ser 09/09/2021 0.74  0.44 - 1.00 mg/dL Final   Calcium 09/09/2021 9.0  8.9 - 10.3 mg/dL Final   Total Protein 09/09/2021 7.4  6.5 - 8.1 g/dL Final   Albumin 09/09/2021 3.1 (L)  3.5 - 5.0 g/dL Final   AST 09/09/2021 13 (L)  15 - 41 U/L Final   ALT 09/09/2021 13  0 - 44 U/L Final   Alkaline Phosphatase 09/09/2021 66  38 - 126 U/L Final   Total Bilirubin 09/09/2021 0.2 (L)  0.3 - 1.2 mg/dL Final   GFR, Estimated 09/09/2021 >60  >60 mL/min Final   Comment: (NOTE) Calculated using the CKD-EPI Creatinine Equation (2021)    Anion gap 09/09/2021 10  5 - 15 Final   Performed at [facility], [place] [address] [place], [place] [address]   WBC 09/09/2021 8.5  4.0 - 10.5 K/uL Final   RBC 09/09/2021 4.53  3.87 - 5.11 MIL/uL Final    Hemoglobin 09/09/2021 11.8 (L)  12.0 - 15.0 g/dL Final   HCT 09/09/2021 38.0  36.0 - 46.0 % Final   MCV 09/09/2021 83.9  80.0 - 100.0 fL Final   MCH 09/09/2021 26.0  26.0 - 34.0 pg Final   MCHC 09/09/2021 31.1  30.0 - 36.0 g/dL Final   RDW 09/09/2021 17.8 (H)  11.5 - 15.5 % Final   Platelets 09/09/2021 442 (H)  150 - 400 K/uL Final   nRBC 09/09/2021 0.0  0.0 - 0.2 % Final   Neutrophils Relative % 09/09/2021 70  % Final   Neutro Abs 09/09/2021 5.9  1.7 - 7.7 K/uL Final   Lymphocytes Relative 09/09/2021 20  % Final   Lymphs Abs 09/09/2021 1.7  0.7 - 4.0 K/uL Final   Monocytes Relative 09/09/2021 8  % Final   Monocytes Absolute 09/09/2021 0.7  0.1 - 1.0 K/uL Final   Eosinophils Relative 09/09/2021 1  % Final   Eosinophils Absolute 09/09/2021 0.1  0.0 - 0.5 K/uL Final   Basophils Relative 09/09/2021 1  % Final   Basophils Absolute 09/09/2021 0.1  0.0 - 0.1 K/uL Final   Immature Granulocytes 09/09/2021 0  % Final   Abs Immature Granulocytes 09/09/2021 0.03  0.00 - 0.07 K/uL Final   Performed at [facility], [place] [address] [place], [place] [address]   Ammonia 09/09/2021 37 (H)  9 - 35 umol/L Final   Comment: HEMOLYSIS AT THIS LEVEL MAY AFFECT RESULT Performed at [facility], [place] [address] [place], [place] [address]    Opiates 09/09/2021 NONE DETECTED  NONE DETECTED Final   Cocaine 09/09/2021 NONE DETECTED  NONE DETECTED Final   Benzodiazepines 09/09/2021 NONE DETECTED  NONE DETECTED Final   Amphetamines 09/09/2021 NONE DETECTED  NONE DETECTED Final   Tetrahydrocannabinol 09/09/2021 NONE DETECTED  NONE DETECTED Final   Barbiturates 09/09/2021 NONE DETECTED  NONE DETECTED Final   Comment: (NOTE) DRUG SCREEN FOR MEDICAL PURPOSES ONLY.  IF CONFIRMATION IS NEEDED FOR ANY PURPOSE, NOTIFY LAB WITHIN 5 DAYS.  LOWEST DETECTABLE LIMITS FOR URINE DRUG SCREEN Drug Class                     Cutoff (ng/mL) Amphetamine and metabolites    1000 Barbiturate and metabolites     200 Benzodiazepine  578 Tricyclics and metabolites     300 Opiates and metabolites        300 Cocaine and metabolites        300 THC                            50 Performed at [facility], [place] [address], [place], [place] [address]    Alcohol, Ethyl (B) 09/09/2021 <10  <10 mg/dL Final   Comment: (NOTE) Lowest detectable limit for serum alcohol is 10 mg/dL.  For medical purposes only. Performed at [facility], [place] [address] [place], [place] [address]    Lipase 09/09/2021 33  11 - 51 U/L Final   Performed at [place] [address] [place], [place] [address]   Color, Urine 09/09/2021 COLORLESS (A)  YELLOW Final   APPearance 09/09/2021 CLEAR  CLEAR Final   Specific Gravity, Urine 09/09/2021 1.002 (L)  1.005 - 1.030 Final   pH 09/09/2021 6.0  5.0 - 8.0 Final   Glucose, UA 09/09/2021 NEGATIVE  NEGATIVE mg/dL Final   Hgb urine dipstick 09/09/2021 NEGATIVE  NEGATIVE Final   Bilirubin Urine 09/09/2021 NEGATIVE  NEGATIVE Final   Ketones, ur 09/09/2021 NEGATIVE  NEGATIVE mg/dL Final   Protein, ur 09/09/2021 NEGATIVE  NEGATIVE mg/dL Final   Nitrite 09/09/2021 NEGATIVE  NEGATIVE Final   Leukocytes,Ua 09/09/2021 NEGATIVE  NEGATIVE Final   Performed at [place] [address], [place], [place] [address]   Specimen Description 09/09/2021 URINE, CLEAN CATCH   Final   Special Requests 09/09/2021 NONE   Final   Culture 09/09/2021  (A)   Final                   Value:<10,000 COLONIES/mL INSIGNIFICANT GROWTH Performed at [facility], [place] [address] [place], [place] [address]    Report Status 09/09/2021 09/10/2021 FINAL   Final   D-Dimer, Quant 09/09/2021 0.92 (H)  0.00 - 0.50 ug/mL-FEU Final   Comment: (NOTE) At the manufacturer cut-off value of 0.5 g/mL FEU, this assay has a negative predictive value of 95-100%.This assay is intended for use in conjunction with a clinical pretest probability (PTP) assessment model to exclude  pulmonary embolism (PE) and deep venous thrombosis (DVT) in outpatients suspected of PE or DVT. Results should be correlated with clinical presentation. Performed at [facility], [place] [address] [place], [place] [address]    Troponin I (High Sensitivity) 09/09/2021 5  <18 ng/L Final   Comment: (NOTE) Elevated high sensitivity troponin I (hsTnI) values and significant  changes across serial measurements may suggest ACS but many other  chronic and acute conditions are known to elevate hsTnI results.  Refer to the "Links" section for chest pain algorithms and additional  guidance. Performed at [facility], [place] [address] [place], [place] [address]    BP 09/10/2021 135/83  mmHg Final   S' Lateral 09/10/2021 2.30  cm Final   Area-P 1/2 09/10/2021 5.58  cm2 Final    Blood Alcohol level:  Lab Results  Component Value Date   ETH <10 11/23/2021   ETH <10 56/38/7564    Metabolic Disorder Labs: Lab Results  Component Value Date   HGBA1C 6.7 (H) 12/28/2021   MPG 145.59 12/28/2021   MPG 154.2 10/04/2021   Lab Results  Component Value Date   PROLACTIN 3.8 (L) 11/23/2021   Lab Results  Component  Value Date   CHOL 171 11/23/2021   TRIG 125 11/23/2021   HDL 65 11/23/2021   CHOLHDL 2.6 11/23/2021   VLDL 25 11/23/2021   LDLCALC 81 11/23/2021   LDLCALC 95 10/04/2021    Therapeutic Lab Levels: No results found for: "LITHIUM" Lab Results  Component Value Date   VALPROATE 33 (L) 12/28/2021   VALPROATE 54 12/03/2021   No results found for: "CBMZ"  Physical Findings   PHQ2-9    [place] ED from 11/23/2021 in [facility]  PHQ-2 Total Score 2  PHQ-9 Total Score 3      Flowsheet Row ED from 11/23/2021 in [facility] Most recent reading at 11/23/2021  6:44 PM ED from 11/22/2021 in [place] [facility] Most recent reading at 11/22/2021  8:28 PM ED from 11/22/2021 in [place] [facility] Most recent reading at 11/22/2021  1:24 AM  C-SSRS RISK CATEGORY No Risk No Risk No Risk        Musculoskeletal  Strength & Muscle Tone: within normal limits Gait & Station: normal Patient leans: N/A  Psychiatric Specialty Exam  Presentation  General Appearance:  Appropriate for Environment  Eye Contact: Fair  Speech: Clear and Coherent  Speech Volume: Normal  Handedness: Right   Mood and Affect  Mood: -- ("alright")  Affect: Flat   Thought Process  Thought Processes: Coherent  Descriptions of Associations:Intact  Orientation:Full (Time, Place and Person)  Thought Content:WDL  Diagnosis of Schizophrenia or Schizoaffective disorder in past: Yes  Duration of Psychotic Symptoms: No data recorded  Hallucinations:Hallucinations: None  Ideas of Reference:None  Suicidal Thoughts:Suicidal Thoughts: No  Homicidal Thoughts:Homicidal Thoughts: No   Sensorium  Memory: Immediate Fair  Judgment: Intact  Insight: Present   Executive Functions  Concentration: Fair  Attention Span: Fair  Recall: [employer] of Knowledge: Fair  Language: Fair   Psychomotor Activity  Psychomotor Activity: Psychomotor Activity: Normal   Assets  Assets: Communication Skills; Desire for Improvement   Sleep  Sleep: Sleep: Fair   No data recorded  Physical Exam  Physical Exam Constitutional:      General: She is not in acute distress.    Appearance: She is not ill-appearing, toxic-appearing or diaphoretic.  Eyes:     General: No scleral icterus. Cardiovascular:     Rate and Rhythm: Normal rate.  Pulmonary:     Effort: Pulmonary effort is normal. No respiratory distress.  Neurological:     Mental Status: She is alert and oriented to person, place, and time.  Psychiatric:        Attention and Perception: Attention and perception normal.        Mood and Affect: Mood normal. Affect is flat.        Speech: Speech  normal.        Behavior: Behavior normal. Behavior is cooperative.        Thought Content: Thought content normal.    Review of Systems  Constitutional:  Negative for chills and fever.  Respiratory:  Negative for shortness of breath.   Cardiovascular:  Negative for chest pain and palpitations.  Gastrointestinal:  Negative for abdominal pain.  Neurological:  Negative for headaches.   Blood pressure 131/61, pulse 70, temperature (!) 97.3 F (36.3 C), temperature source Oral, resp. rate 18, SpO2 96 %. There is no height or weight on file to calculate BMI.  Treatment Plan Summary: Plan Pt remains psychiatrically cleared. Disposition is pending placement.  Geni Bers  Eric Form, NP 01/09/2022 10:16 AM

## 2022-01-09 NOTE — ED Notes (Signed)
Pt requested for pain medication to help with leg pain of 9/10. Writer administered 650mg  of tylenol PRN.

## 2022-01-10 DIAGNOSIS — F209 Schizophrenia, unspecified: Secondary | ICD-10-CM | POA: Diagnosis not present

## 2022-01-10 LAB — GLUCOSE, CAPILLARY
Glucose-Capillary: 106 mg/dL — ABNORMAL HIGH (ref 70–99)
Glucose-Capillary: 84 mg/dL (ref 70–99)
Glucose-Capillary: 141 mg/dL — ABNORMAL HIGH (ref 70–99)
Glucose-Capillary: 119 mg/dL — ABNORMAL HIGH (ref 70–99)

## 2022-01-10 NOTE — ED Provider Notes (Signed)
Behavioral Health Progress Note  Date and Time: 01/10/2022 8:55 AM Name: Teresa Murillo MRN:  [id number]  Subjective:   Teresa Murillo is a 61 y.o. female with a past psychiatric history of schizophrenia, aggressive behavior, and possible intellectual disability presenting to [facility] on 11/23/21 voluntarily as a walk in via [facility] with complaints that she was locked out of the group home. Patient has been dismissed from group home due to multiple elopements. Pt had already been discharged from this facility but had been living there temporarily while DSS was looking for new placement. Pt is boarding at [facility].   AID to Capacity Evaluation (ACE) completed by Dr. Louis Meckel, Dr. Dwyane Dee on 12/11/2021.  Please see media tab for full details.    [name], PhD completed: Wechsler Adult Intelligence Scale-4, Teresa Murillo achieved a full-scale IQ score of 73 and a percentile rank of 4 placing her in the borderline range of intellectual functioning (12/14/2021) Please see consult note from [name], PhD on 12/14/2021  On reassessment pt is sleeping, wakens to name being called. She endorses "alright" mood. She denies SI/VI/HI, AVH, paranoia. She reports she is sleepy and will continue sleeping. She denies any physical complaints or other concerns at this time. There is no evidence of internal preoccupation, agitation, aggression, or distractibility. Pt is calm, cooperative, pleasant. No delusions or paranoia elicited. Pt remains psychiatrically cleared pending placement.   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 On the  Wechsler Adult Intelligence Scale-4, Teresa Murillo achieved a full-scale IQ score of 73 and a percentile rank of 4 placing her in the borderline range of intellectual functioning.    Chronic obstructive pulmonary disease (COPD) (HCC)    Glaucoma    Hyperlipidemia    Hypertension    Iron deficiency    Schizoaffective disorder (HCC)    Type 2 diabetes mellitus (HCC)     Past Surgical History:  Procedure Laterality Date   TUBAL LIGATION     Family History:  Family History  Problem Relation Age of Onset   Breast cancer Maternal Grandmother    Family Psychiatric  History: None reported Social History:  Social History   Substance and Sexual Activity  Alcohol Use Yes     Social History   Substance and Sexual Activity  Drug Use Not Currently    Social History   Socioeconomic History   Marital status: Divorced    Spouse name: Not on file   Number of children: Not on file   Years of education: Not on file   Highest education level: Not on file  Occupational History   Not on file  Tobacco Use   Smoking status: Every Day    Packs/day: 1.00    Types: Cigars, Cigarettes   Smokeless tobacco: Current  Vaping Use   Vaping Use: Never used  Substance and Sexual Activity   Alcohol use: Yes   Drug use: Not Currently   Sexual activity: Not Currently    Birth control/protection: Surgical  Other Topics Concern   Not on file  Social History Narrative   Not on file   Social Determinants of Health   Financial Resource Strain: Not on file  Food Insecurity: Not on file  Transportation Needs: Not on file  Physical Activity: Not on file  Stress: Not on file  Social Connections: Not on file   SDOH:  SDOH Screenings   Depression (PHQ2-9): Low Risk  (11/23/2021)  Tobacco Use: High Risk (12/30/2021)   Additional Social History:    Pain Medications: See MAR Prescriptions: See MAR Over the Counter: See MAR History of alcohol / drug use?: No history of alcohol / drug  abuse Longest period of sobriety (when/how long): N/A                    Sleep: Good  Appetite:  Good  Current Medications:  Current Facility-Administered Medications  Medication Dose Route Frequency Provider Last Rate Last Admin   acetaminophen (TYLENOL) tablet 650 mg  650 mg Oral Q6H PRN Rankin, Shuvon B, NP   650 mg at 01/09/22 0047   albuterol (VENTOLIN HFA) 108 (90 Base) MCG/ACT inhaler 2 puff  2 puff Inhalation Q6H PRN Rankin, Shuvon B, NP   2 puff at 12/09/21 2102   alum & mag hydroxide-simeth (MAALOX/MYLANTA) 200-200-20 MG/5ML suspension 30 mL  30 mL Oral Q4H PRN Rankin, Shuvon B, NP   30 mL at 12/19/21 2109   apixaban (ELIQUIS) tablet 5 mg  5 mg Oral BID Rankin, Shuvon B, NP   5 mg at 01/09/22 2104   cloZAPine (CLOZARIL) tablet 50 mg  50 mg Oral Daily [name], NP   50 mg at 01/09/22 0904   cloZAPine (CLOZARIL) tablet 75 mg  75 mg Oral Dorie Rank, NP   75 mg at 01/09/22 2103   diltiazem (CARDIZEM CD) 24 hr capsule 240 mg  240 mg Oral Daily Rankin, Shuvon B, NP   240 mg at 01/09/22 0904   divalproex (DEPAKOTE ER) 24 hr tablet 500 mg  500 mg Oral BID Rankin, Shuvon B, NP   500 mg at 01/09/22 2104   fluticasone furoate-vilanterol (BREO ELLIPTA) 200-25 MCG/ACT 1 puff  1 puff Inhalation Daily Rankin, Shuvon B, NP   1 puff at 01/09/22 0900   haloperidol (HALDOL) tablet 10 mg  10 mg Oral Q8H PRN Rankin, Shuvon B, NP       insulin aspart (novoLOG) injection 0-9 Units  0-9 Units Subcutaneous TID WC Tharon Aquas, NP   2 Units at 01/09/22 1636   insulin glargine-yfgn (SEMGLEE) injection 10 Units  10 Units Subcutaneous Daily [name], DO   10 Units at 01/09/22 0903   latanoprost (XALATAN) 0.005 % ophthalmic solution 1 drop  1 drop Both Eyes QHS Rankin, Shuvon B, NP   1 drop at 01/09/22 2103   magnesium hydroxide (MILK OF MAGNESIA) suspension 30 mL  30 mL Oral Daily PRN Rankin, Shuvon B, NP       menthol-cetylpyridinium (CEPACOL) lozenge 3 mg  1 lozenge Oral Once  [name], NP       menthol-cetylpyridinium (CEPACOL) lozenge 3 mg  1 lozenge Oral PRN [name], NP   3 mg at 01/09/22 2236   metFORMIN (GLUCOPHAGE-XR) 24 hr tablet 1,000 mg  1,000 mg Oral BID WC [name], DO   1,000 mg at 01/10/22 2620   multivitamins with iron tablet 1 tablet  1 tablet Oral Daily [name], DO   1 tablet at 01/09/22 0904   nicotine (NICODERM CQ - dosed in mg/24 hr) patch 7 mg  7 mg Transdermal Daily Rankin, Shuvon B, NP   7 mg at 01/09/22 0906   polyethylene  glycol (MIRALAX / GLYCOLAX) packet 17 g  17 g Oral Daily [name], DO   17 g at 01/09/22 6599   Vitamin D (Ergocalciferol) (DRISDOL) 1.25 MG (50000 UNIT) capsule 50,000 Units  50,000 Units Oral Q7 days Rankin, Shuvon B, NP   50,000 Units at 01/07/22 3570   Current Outpatient Medications  Medication Sig Dispense Refill   Accu-Chek Softclix Lancets lancets Use as directed up to four times daily 100 each 0   apixaban (ELIQUIS) 5 MG TABS tablet Take 1 tablet (5 mg total) by mouth 2 (two) times daily. 60 tablet 0   ARIPiprazole (ABILIFY) 10 MG tablet Take 1 tablet (10 mg total) by mouth daily. 30 tablet 0   Blood Glucose Monitoring Suppl (ACCU-CHEK GUIDE) w/Device KIT Use as directed up to four times daily 1 kit 0   budesonide-formoterol (SYMBICORT) 160-4.5 MCG/ACT inhaler Inhale 2 puffs into the lungs in the morning and at bedtime.     Cholecalciferol (VITAMIN D3) 1.25 MG (50000 UT) CAPS Take 50,000 Units by mouth every Thursday.     cloZAPine (CLOZARIL) 25 MG tablet Take 3 tablets (75 mg total) by mouth at bedtime. 90 tablet 0   clozapine (CLOZARIL) 50 MG tablet Take 1 tablet (50 mg total) by mouth daily. 30 tablet 0   diltiazem (CARDIZEM CD) 240 MG 24 hr capsule Take 1 capsule (240 mg total) by mouth daily. (Patient not taking: Reported on 11/24/2021) 30 capsule 0   divalproex (DEPAKOTE ER) 500 MG 24 hr tablet Take 1 tablet (500 mg total) by mouth 2 (two) times daily. 60 tablet 0   glucose blood test  strip Use as directed up to four times daily 50 each 0   haloperidol (HALDOL) 10 MG tablet Take 1 tablet (10 mg total) by mouth 3 (three) times daily as needed for agitation (and psychotic symptoms).     INGREZZA 40 MG capsule Take 1 capsule (40 mg total) by mouth in the morning. 30 capsule 0   insulin aspart (NOVOLOG) 100 UNIT/ML FlexPen Before each meal 3 times a day, 140-199 - 2 units, 200-250 - 4 units, 251-299 - 6 units,  300-349 - 8 units,  350 or above 10 units. Insulin PEN if approved, provide syringes and needles if needed.Please switch to any approved short acting Insulin if needed. 15 mL 0   insulin glargine (LANTUS) 100 UNIT/ML Solostar Pen Inject 12 Units into the skin daily. 15 mL 0   Insulin Pen Needle 32G X 4 MM MISC Use 4 times a day with insulin, 1 month supply. 100 each 0   latanoprost (XALATAN) 0.005 % ophthalmic solution Place 1 drop into both eyes at bedtime.     metFORMIN (GLUCOPHAGE) 500 MG tablet Take 500 mg by mouth 2 (two) times daily with a meal.     nicotine (NICODERM CQ - DOSED IN MG/24 HR) 7 mg/24hr patch Place 1 patch (7 mg total) onto the skin daily. 28 patch 0   PROAIR HFA 108 (90 Base) MCG/ACT inhaler Inhale 2 puffs into the lungs every 6 (six) hours as needed for wheezing or shortness of breath.      Labs  Lab Results:  No results displayed because visit has over 200 results.    Admission on 11/22/2021, Discharged on 11/22/2021  Component Date Value Ref Range Status   Glucose-Capillary 11/22/2021 169 (H)  70 - 99 mg/dL Final   Glucose reference range applies only to samples taken after fasting for at least 8 hours.  No  results displayed because visit has over 200 results.    Admission on 10/04/2021, Discharged on 10/22/2021  Component Date Value Ref Range Status   SARS Coronavirus 2 by RT PCR 10/04/2021 NEGATIVE  NEGATIVE Final   Comment: (NOTE) SARS-CoV-2 target nucleic acids are NOT DETECTED.  The SARS-CoV-2 RNA is generally detectable in upper  respiratory specimens during the acute phase of infection. The lowest concentration of SARS-CoV-2 viral copies this assay can detect is 138 copies/mL. A negative result does not preclude SARS-Cov-2 infection and should not be used as the sole basis for treatment or other patient management decisions. A negative result may occur with  improper specimen collection/handling, submission of specimen other than nasopharyngeal swab, presence of viral mutation(s) within the areas targeted by this assay, and inadequate number of viral copies(<138 copies/mL). A negative result must be combined with clinical observations, patient history, and epidemiological information. The expected result is Negative.  Fact Sheet for Patients:  EntrepreneurPulse.com.au  Fact Sheet for Healthcare Providers:  IncredibleEmployment.be  This test is no                          t yet approved or cleared by the [place] FDA and  has been authorized for detection and/or diagnosis of SARS-CoV-2 by FDA under an Emergency Use Authorization (EUA). This EUA will remain  in effect (meaning this test can be used) for the duration of the COVID-19 declaration under Section 564(b)(1) of the Act, 21 U.S.C.section 360bbb-3(b)(1), unless the authorization is terminated  or revoked sooner.       Influenza A by PCR 10/04/2021 NEGATIVE  NEGATIVE Final   Influenza B by PCR 10/04/2021 NEGATIVE  NEGATIVE Final   Comment: (NOTE) The Xpert Xpress SARS-CoV-2/FLU/RSV plus assay is intended as an aid in the diagnosis of influenza from Nasopharyngeal swab specimens and should not be used as a sole basis for treatment. Nasal washings and aspirates are unacceptable for Xpert Xpress SARS-CoV-2/FLU/RSV testing.  Fact Sheet for Patients: EntrepreneurPulse.com.au  Fact Sheet for Healthcare Providers: IncredibleEmployment.be  This test is not yet approved or  cleared by the [place] FDA and has been authorized for detection and/or diagnosis of SARS-CoV-2 by FDA under an Emergency Use Authorization (EUA). This EUA will remain in effect (meaning this test can be used) for the duration of the COVID-19 declaration under Section 564(b)(1) of the Act, 21 U.S.C. section 360bbb-3(b)(1), unless the authorization is terminated or revoked.  Performed at [facility], [place] [address] [place], [place] [address]    WBC 10/04/2021 8.4  4.0 - 10.5 K/uL Final   RBC 10/04/2021 4.42  3.87 - 5.11 MIL/uL Final   Hemoglobin 10/04/2021 11.6 (L)  12.0 - 15.0 g/dL Final   HCT 10/04/2021 35.7 (L)  36.0 - 46.0 % Final   MCV 10/04/2021 80.8  80.0 - 100.0 fL Final   MCH 10/04/2021 26.2  26.0 - 34.0 pg Final   MCHC 10/04/2021 32.5  30.0 - 36.0 g/dL Final   RDW 10/04/2021 16.0 (H)  11.5 - 15.5 % Final   Platelets 10/04/2021 372  150 - 400 K/uL Final   nRBC 10/04/2021 0.0  0.0 - 0.2 % Final   Neutrophils Relative % 10/04/2021 68  % Final   Neutro Abs 10/04/2021 5.7  1.7 - 7.7 K/uL Final   Lymphocytes Relative 10/04/2021 22  % Final   Lymphs Abs 10/04/2021 1.8  0.7 - 4.0 K/uL Final   Monocytes Relative 10/04/2021  8  % Final   Monocytes Absolute 10/04/2021 0.7  0.1 - 1.0 K/uL Final   Eosinophils Relative 10/04/2021 1  % Final   Eosinophils Absolute 10/04/2021 0.1  0.0 - 0.5 K/uL Final   Basophils Relative 10/04/2021 1  % Final   Basophils Absolute 10/04/2021 0.1  0.0 - 0.1 K/uL Final   Immature Granulocytes 10/04/2021 0  % Final   Abs Immature Granulocytes 10/04/2021 0.03  0.00 - 0.07 K/uL Final   Performed at [facility], [place] [address] [place], [place] [address]   Sodium 10/04/2021 136  135 - 145 mmol/L Final   Potassium 10/04/2021 4.2  3.5 - 5.1 mmol/L Final   Chloride 10/04/2021 104  98 - 111 mmol/L Final   CO2 10/04/2021 25  22 - 32 mmol/L Final   Glucose, Bld 10/04/2021 100 (H)  70 - 99 mg/dL Final   Glucose reference range applies only to  samples taken after fasting for at least 8 hours.   BUN 10/04/2021 9  6 - 20 mg/dL Final   Creatinine, Ser 10/04/2021 0.52  0.44 - 1.00 mg/dL Final   Calcium 10/04/2021 9.0  8.9 - 10.3 mg/dL Final   Total Protein 10/04/2021 7.0  6.5 - 8.1 g/dL Final   Albumin 10/04/2021 3.2 (L)  3.5 - 5.0 g/dL Final   AST 10/04/2021 13 (L)  15 - 41 U/L Final   ALT 10/04/2021 10  0 - 44 U/L Final   Alkaline Phosphatase 10/04/2021 61  38 - 126 U/L Final   Total Bilirubin 10/04/2021 0.3  0.3 - 1.2 mg/dL Final   GFR, Estimated 10/04/2021 >60  >60 mL/min Final   Comment: (NOTE) Calculated using the CKD-EPI Creatinine Equation (2021)    Anion gap 10/04/2021 7  5 - 15 Final   Performed at [facility] [address], [place], [place] [address]   Hgb A1c MFr Bld 10/04/2021 7.0 (H)  4.8 - 5.6 % Final   Comment: (NOTE) Pre diabetes:          5.7%-6.4%  Diabetes:              >6.4%  Glycemic control for   <7.0% adults with diabetes    Mean Plasma Glucose 10/04/2021 154.2  mg/dL Final   Performed at [facility], [place] [address], [place], [place] [address]   Cholesterol 10/04/2021 178  0 - 200 mg/dL Final   Triglycerides 10/04/2021 155 (H)  <150 mg/dL Final   HDL 10/04/2021 52  >40 mg/dL Final   Total CHOL/HDL Ratio 10/04/2021 3.4  RATIO Final   VLDL 10/04/2021 31  0 - 40 mg/dL Final   LDL Cholesterol 10/04/2021 95  0 - 99 mg/dL Final   Comment:        Total Cholesterol/HDL:CHD Risk Coronary Heart Disease Risk Table                     Men   Women  1/2 Average Risk   3.4   3.3  Average Risk       5.0   4.4  2 X Average Risk   9.6   7.1  3 X Average Risk  23.4   11.0        Use the calculated Patient Ratio above and the CHD Risk Table to determine the patient's CHD Risk.        ATP III CLASSIFICATION (LDL):  <100     mg/dL   Optimal  100-129  mg/dL   Near or Above                    Optimal  130-159  mg/dL   Borderline  160-189  mg/dL   High  >190     mg/dL   Very High Performed at  [facility] [address], [place], [place] [address]    POC Amphetamine UR 10/04/2021 None Detected  NONE DETECTED (Cut Off Level 1000 ng/mL) Final   POC Secobarbital (BAR) 10/04/2021 None Detected  NONE DETECTED (Cut Off Level 300 ng/mL) Final   POC Buprenorphine (BUP) 10/04/2021 None Detected  NONE DETECTED (Cut Off Level 10 ng/mL) Final   POC Oxazepam (BZO) 10/04/2021 None Detected  NONE DETECTED (Cut Off Level 300 ng/mL) Final   POC Cocaine UR 10/04/2021 None Detected  NONE DETECTED (Cut Off Level 300 ng/mL) Final   POC Methamphetamine UR 10/04/2021 None Detected  NONE DETECTED (Cut Off Level 1000 ng/mL) Final   POC Morphine 10/04/2021 None Detected  NONE DETECTED (Cut Off Level 300 ng/mL) Final   POC Methadone UR 10/04/2021 None Detected  NONE DETECTED (Cut Off Level 300 ng/mL) Final   POC Oxycodone UR 10/04/2021 Positive (A)  NONE DETECTED (Cut Off Level 100 ng/mL) Final   POC Marijuana UR 10/04/2021 None Detected  NONE DETECTED (Cut Off Level 50 ng/mL) Final   SARSCOV2ONAVIRUS 2 AG 10/04/2021 NEGATIVE  NEGATIVE Final   Comment: (NOTE) SARS-CoV-2 antigen NOT DETECTED.   Negative results are presumptive.  Negative results do not preclude SARS-CoV-2 infection and should not be used as the sole basis for treatment or other patient management decisions, including infection  control decisions, particularly in the presence of clinical signs and  symptoms consistent with COVID-19, or in those who have been in contact with the virus.  Negative results must be combined with clinical observations, patient history, and epidemiological information. The expected result is Negative.  Fact Sheet for Patients: HandmadeRecipes.com.cy  Fact Sheet for Healthcare Providers: FuneralLife.at  This test is not yet approved or cleared by the [place] FDA and  has been authorized for detection and/or diagnosis of SARS-CoV-2 by FDA under an  Emergency Use Authorization (EUA).  This EUA will remain in effect (meaning this test can be used) for the duration of  the COV                          ID-19 declaration under Section 564(b)(1) of the Act, 21 U.S.C. section 360bbb-3(b)(1), unless the authorization is terminated or revoked sooner.     Valproic Acid Lvl 10/04/2021 51  50.0 - 100.0 ug/mL Final   Performed at [facility] [address], [place], [place] [address]   Valproic Acid Lvl 10/08/2021 57  50.0 - 100.0 ug/mL Final   Performed at [facility] [address], [place], [place] [address]   Glucose-Capillary 10/09/2021 123 (H)  70 - 99 mg/dL Final   Glucose reference range applies only to samples taken after fasting for at least 8 hours.  Admission on 09/09/2021, Discharged on 09/10/2021  Component Date Value Ref Range Status   Sodium 09/09/2021 134 (L)  135 - 145 mmol/L Final   Potassium 09/09/2021 4.3  3.5 - 5.1 mmol/L Final   Chloride 09/09/2021 99  98 - 111 mmol/L Final   CO2 09/09/2021 25  22 - 32 mmol/L Final   Glucose, Bld 09/09/2021 107 (H)  70 -  99 mg/dL Final   Glucose reference range applies only to samples taken after fasting for at least 8 hours.   BUN 09/09/2021 12  6 - 20 mg/dL Final   Creatinine, Ser 09/09/2021 0.74  0.44 - 1.00 mg/dL Final   Calcium 09/09/2021 9.0  8.9 - 10.3 mg/dL Final   Total Protein 09/09/2021 7.4  6.5 - 8.1 g/dL Final   Albumin 09/09/2021 3.1 (L)  3.5 - 5.0 g/dL Final   AST 09/09/2021 13 (L)  15 - 41 U/L Final   ALT 09/09/2021 13  0 - 44 U/L Final   Alkaline Phosphatase 09/09/2021 66  38 - 126 U/L Final   Total Bilirubin 09/09/2021 0.2 (L)  0.3 - 1.2 mg/dL Final   GFR, Estimated 09/09/2021 >60  >60 mL/min Final   Comment: (NOTE) Calculated using the CKD-EPI Creatinine Equation (2021)    Anion gap 09/09/2021 10  5 - 15 Final   Performed at [facility], [place] [address], [place], [place] [address]   WBC 09/09/2021 8.5  4.0 - 10.5 K/uL Final   RBC  09/09/2021 4.53  3.87 - 5.11 MIL/uL Final   Hemoglobin 09/09/2021 11.8 (L)  12.0 - 15.0 g/dL Final   HCT 09/09/2021 38.0  36.0 - 46.0 % Final   MCV 09/09/2021 83.9  80.0 - 100.0 fL Final   MCH 09/09/2021 26.0  26.0 - 34.0 pg Final   MCHC 09/09/2021 31.1  30.0 - 36.0 g/dL Final   RDW 09/09/2021 17.8 (H)  11.5 - 15.5 % Final   Platelets 09/09/2021 442 (H)  150 - 400 K/uL Final   nRBC 09/09/2021 0.0  0.0 - 0.2 % Final   Neutrophils Relative % 09/09/2021 70  % Final   Neutro Abs 09/09/2021 5.9  1.7 - 7.7 K/uL Final   Lymphocytes Relative 09/09/2021 20  % Final   Lymphs Abs 09/09/2021 1.7  0.7 - 4.0 K/uL Final   Monocytes Relative 09/09/2021 8  % Final   Monocytes Absolute 09/09/2021 0.7  0.1 - 1.0 K/uL Final   Eosinophils Relative 09/09/2021 1  % Final   Eosinophils Absolute 09/09/2021 0.1  0.0 - 0.5 K/uL Final   Basophils Relative 09/09/2021 1  % Final   Basophils Absolute 09/09/2021 0.1  0.0 - 0.1 K/uL Final   Immature Granulocytes 09/09/2021 0  % Final   Abs Immature Granulocytes 09/09/2021 0.03  0.00 - 0.07 K/uL Final   Performed at [facility], [place] [address], [place], [place] [address]   Ammonia 09/09/2021 37 (H)  9 - 35 umol/L Final   Comment: HEMOLYSIS AT THIS LEVEL MAY AFFECT RESULT Performed at [facility], [place] [address] [place], [place] [address]    Opiates 09/09/2021 NONE DETECTED  NONE DETECTED Final   Cocaine 09/09/2021 NONE DETECTED  NONE DETECTED Final   Benzodiazepines 09/09/2021 NONE DETECTED  NONE DETECTED Final   Amphetamines 09/09/2021 NONE DETECTED  NONE DETECTED Final   Tetrahydrocannabinol 09/09/2021 NONE DETECTED  NONE DETECTED Final   Barbiturates 09/09/2021 NONE DETECTED  NONE DETECTED Final   Comment: (NOTE) DRUG SCREEN FOR MEDICAL PURPOSES ONLY.  IF CONFIRMATION IS NEEDED FOR ANY PURPOSE, NOTIFY LAB WITHIN 5 DAYS.  LOWEST DETECTABLE LIMITS FOR URINE DRUG SCREEN Drug Class                     Cutoff (ng/mL) Amphetamine and metabolites     1000 Barbiturate and metabolites    200 Benzodiazepine  045 Tricyclics and metabolites     300 Opiates and metabolites        300 Cocaine and metabolites        300 THC                            50 Performed at [facility], [place] [address], [place], [place] [address]    Alcohol, Ethyl (B) 09/09/2021 <10  <10 mg/dL Final   Comment: (NOTE) Lowest detectable limit for serum alcohol is 10 mg/dL.  For medical purposes only. Performed at [facility], [place] [address], [place], [place] [address]    Lipase 09/09/2021 33  11 - 51 U/L Final   Performed at [facility] [address] [place], [place] [address]   Color, Urine 09/09/2021 COLORLESS (A)  YELLOW Final   APPearance 09/09/2021 CLEAR  CLEAR Final   Specific Gravity, Urine 09/09/2021 1.002 (L)  1.005 - 1.030 Final   pH 09/09/2021 6.0  5.0 - 8.0 Final   Glucose, UA 09/09/2021 NEGATIVE  NEGATIVE mg/dL Final   Hgb urine dipstick 09/09/2021 NEGATIVE  NEGATIVE Final   Bilirubin Urine 09/09/2021 NEGATIVE  NEGATIVE Final   Ketones, ur 09/09/2021 NEGATIVE  NEGATIVE mg/dL Final   Protein, ur 09/09/2021 NEGATIVE  NEGATIVE mg/dL Final   Nitrite 09/09/2021 NEGATIVE  NEGATIVE Final   Leukocytes,Ua 09/09/2021 NEGATIVE  NEGATIVE Final   Performed at [facility] [address] [place], [place] [address]   Specimen Description 09/09/2021 URINE, CLEAN CATCH   Final   Special Requests 09/09/2021 NONE   Final   Culture 09/09/2021  (A)   Final                   Value:<10,000 COLONIES/mL INSIGNIFICANT GROWTH Performed at [facility], [place] [address], [place], [place] [address]    Report Status 09/09/2021 09/10/2021 FINAL   Final   D-Dimer, Quant 09/09/2021 0.92 (H)  0.00 - 0.50 ug/mL-FEU Final   Comment: (NOTE) At the manufacturer cut-off value of 0.5 g/mL FEU, this assay has a negative predictive value of 95-100%.This assay is intended for use in conjunction with a clinical pretest probability  (PTP) assessment model to exclude pulmonary embolism (PE) and deep venous thrombosis (DVT) in outpatients suspected of PE or DVT. Results should be correlated with clinical presentation. Performed at [facility], [place] [address] [place], [place] [address]    Troponin I (High Sensitivity) 09/09/2021 5  <18 ng/L Final   Comment: (NOTE) Elevated high sensitivity troponin I (hsTnI) values and significant  changes across serial measurements may suggest ACS but many other  chronic and acute conditions are known to elevate hsTnI results.  Refer to the "Links" section for chest pain algorithms and additional  guidance. Performed at [facility], [place] [address], [place], [place] [address]    BP 09/10/2021 135/83  mmHg Final   S' Lateral 09/10/2021 2.30  cm Final   Area-P 1/2 09/10/2021 5.58  cm2 Final    Blood Alcohol level:  Lab Results  Component Value Date   ETH <10 11/23/2021   ETH <10 32/44/0102    Metabolic Disorder Labs: Lab Results  Component Value Date   HGBA1C 6.7 (H) 12/28/2021   MPG 145.59 12/28/2021   MPG 154.2 10/04/2021   Lab Results  Component Value Date   PROLACTIN 3.8 (L) 11/23/2021   Lab Results  Component  Value Date   CHOL 171 11/23/2021   TRIG 125 11/23/2021   HDL 65 11/23/2021   CHOLHDL 2.6 11/23/2021   VLDL 25 11/23/2021   LDLCALC 81 11/23/2021   LDLCALC 95 10/04/2021    Therapeutic Lab Levels: No results found for: "LITHIUM" Lab Results  Component Value Date   VALPROATE 33 (L) 12/28/2021   VALPROATE 54 12/03/2021   No results found for: "CBMZ"  Physical Findings   PHQ2-9    [place] from 11/23/2021 in [facility]  PHQ-2 Total Score 2  PHQ-9 Total Score 3      Flowsheet Row ED from 11/23/2021 in [facility] Most recent reading at 11/23/2021  6:44 PM ED from 11/22/2021 in [place] [facility] Most recent reading at 11/22/2021  8:28 PM  ED from 11/22/2021 in [facility] Most recent reading at 11/22/2021  1:24 AM  C-SSRS RISK CATEGORY No Risk No Risk No Risk        Musculoskeletal  Strength & Muscle Tone: within normal limits Gait & Station: normal Patient leans: N/A  Psychiatric Specialty Exam  Presentation  General Appearance:  Appropriate for Environment  Eye Contact: Fair  Speech: Clear and Coherent  Speech Volume: Normal  Handedness: Right   Mood and Affect  Mood: -- ("alright")  Affect: Flat   Thought Process  Thought Processes: Coherent  Descriptions of Associations:Intact  Orientation:Full (Time, Place and Person)  Thought Content:WDL  Diagnosis of Schizophrenia or Schizoaffective disorder in past: Yes  Duration of Psychotic Symptoms: No data recorded  Hallucinations:Hallucinations: None  Ideas of Reference:None  Suicidal Thoughts:Suicidal Thoughts: No  Homicidal Thoughts:Homicidal Thoughts: No   Sensorium  Memory: Immediate Fair  Judgment: Intact  Insight: Present   Executive Functions  Concentration: Fair  Attention Span: Fair  Recall: [employer] of Knowledge: Fair  Language: Fair   Psychomotor Activity  Psychomotor Activity: Psychomotor Activity: Normal   Assets  Assets: Communication Skills; Desire for Improvement   Sleep  Sleep: Sleep: Fair   No data recorded  Physical Exam  Physical Exam Constitutional:      General: She is not in acute distress.    Appearance: She is not ill-appearing, toxic-appearing or diaphoretic.  Eyes:     General: No scleral icterus. Cardiovascular:     Rate and Rhythm: Normal rate.  Pulmonary:     Effort: Pulmonary effort is normal. No respiratory distress.  Neurological:     Mental Status: She is alert and oriented to person, place, and time.  Psychiatric:        Attention and Perception: Attention and perception normal.        Mood and Affect: Mood normal.  Affect is flat.        Speech: Speech normal.        Behavior: Behavior normal. Behavior is cooperative.        Thought Content: Thought content normal.    Review of Systems  Constitutional:  Negative for chills and fever.  Respiratory:  Negative for shortness of breath.   Cardiovascular:  Negative for chest pain and palpitations.  Gastrointestinal:  Negative for abdominal pain.   Blood pressure 106/85, pulse 97, temperature 97.6 F (36.4 C), temperature source Oral, resp. rate 18, SpO2 100 %. There is no height or weight on file to calculate BMI.  Treatment Plan Summary: Plan Disposition is pending placement. Pt is psychiatrically cleared.  Tharon Aquas, NP 01/10/2022 8:55 AM

## 2022-01-10 NOTE — ED Notes (Signed)
Pt resting quietly with eyes closed.  No pain or discomfort noted/voiced.  Breathing is even and unlabored.  Will continue to monitor for safety.  

## 2022-01-10 NOTE — Progress Notes (Signed)
Received Teresa Murillo this AM asleep in her chair bed, she woke up on her own, received breakfast after her CBG, medication and she returned to bed.

## 2022-01-10 NOTE — ED Notes (Signed)
Patient is currently sitting in bed eating chips, no distress noted, will continue to monitor for safety

## 2022-01-10 NOTE — Progress Notes (Signed)
Teresa Murillo remained visible in the milieu throughout the day, social with her roommate, eating snacks,meals and napping. She has been cooperative with her medication regimen.

## 2022-01-10 NOTE — ED Notes (Signed)
Pt is in the bed watching TV. Respirations are even and unlabored. No acute distress noted. Will continue to monitor for safety.  

## 2022-01-11 DIAGNOSIS — F209 Schizophrenia, unspecified: Secondary | ICD-10-CM | POA: Diagnosis not present

## 2022-01-11 LAB — GLUCOSE, CAPILLARY
Glucose-Capillary: 110 mg/dL — ABNORMAL HIGH (ref 70–99)
Glucose-Capillary: 147 mg/dL — ABNORMAL HIGH (ref 70–99)
Glucose-Capillary: 127 mg/dL — ABNORMAL HIGH (ref 70–99)
Glucose-Capillary: 181 mg/dL — ABNORMAL HIGH (ref 70–99)

## 2022-01-11 NOTE — ED Notes (Signed)
Pt calm and cooperative. Pt sitting on bed watching television and talking with pt next to her  No c/o pain or distress. Will continue to monitor for safety

## 2022-01-11 NOTE — ED Notes (Signed)
Pt is sleeping. No distress noted. Will continue to monitor safety. 

## 2022-01-11 NOTE — ED Notes (Signed)
Patient alert and ate breakfast. Patient denies SI,HI, and A/V/H with no plan/intent. Patient is med compliant and remains cooperative on unit.

## 2022-01-11 NOTE — ED Notes (Signed)
Pt had bedtime snack. 

## 2022-01-11 NOTE — ED Provider Notes (Signed)
Behavioral Health Progress Note  Date and Time: 01/11/2022 6:25 PM Name: Teresa Murillo MRN:  [id number]  Subjective:  Teresa Murillo 61 y.o., female patient who initially presented to [facility] on 11/23/2021.  At this time she had been discharged from her group home.  She was admitted to the continuous assessment unit while social work and DSS are seeking new placement.  She is boarding at [facility].  Teresa Murillo, 61 y.o., female patient seen face to face by this provider, consulted with Dr. Dwyane Dee; and chart reviewed on 01/11/22.  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  While on the unit she received an 8 ID to capacity evaluation (ACE) on 12/11/2021.  She also completed a Wechsler Adult intelligence scale-4 on 12/14/2021.  Placing her in the borderline range of intellectual functioning.  During evaluation Teresa Murillo is observed laying in her bed awake in no acute distress.  She is alert/oriented x4, calm, and cooperative.  She is fairly attentive and makes good eye contact.  Her speech is clear, coherent, at a normal rate and tone.  She is pleasant.  She denies depression and has a euthymic affect.  She denies any concerns with appetite or sleep.  She denies SI/HI/AVH.  Objectively she does not appear to be responding to internal/external stimuli.  She continues to remain cooperative with staff and other patients.  She is compliant with medications.     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 Past Medical History:  Past Medical History:  Diagnosis Date   Borderline intellectual functioning 12/14/2021   On 12/14/2021: Appreciate  assistance from psychology consult. On the Wechsler Adult Intelligence Scale-4, Ms. Hewins achieved a full-scale IQ score of 73 and a percentile rank of 4 placing her in the borderline range of intellectual functioning.    Chronic obstructive pulmonary disease (COPD) (HCC)    Glaucoma    Hyperlipidemia    Hypertension    Iron deficiency    Schizoaffective disorder (HCC)    Type 2 diabetes mellitus (HCC)     Past Surgical History:  Procedure Laterality Date   TUBAL LIGATION     Family History:  Family History  Problem Relation Age of Onset   Breast cancer Maternal Grandmother    Family Psychiatric  History: None reported Social History:  Social History   Substance and Sexual Activity  Alcohol Use Yes     Social History   Substance and Sexual Activity  Drug Use Not Currently    Social History   Socioeconomic History   Marital status: Divorced    Spouse name: Not on file   Number of children: Not on file   Years of education: Not on file   Highest education level: Not on file  Occupational History   Not on file  Tobacco Use   Smoking status: Every Day    Packs/day: 1.00    Types: Cigars, Cigarettes   Smokeless tobacco: Current  Vaping Use   Vaping Use: Never used  Substance and Sexual Activity   Alcohol use: Yes   Drug use: Not Currently   Sexual activity: Not Currently    Birth control/protection: Surgical  Other Topics Concern   Not on file  Social History Narrative  Not on file   Social Determinants of Health   Financial Resource Strain: Not on file  Food Insecurity: Not on file  Transportation Needs: Not on file  Physical Activity: Not on file  Stress: Not on file  Social Connections: Not on file   SDOH:  SDOH Screenings   Depression (PHQ2-9): Low Risk  (11/23/2021)  Tobacco Use: High Risk (12/30/2021)   Additional Social History:    Pain Medications: See MAR Prescriptions: See MAR Over the Counter: See MAR History of alcohol / drug  use?: No history of alcohol / drug abuse Longest period of sobriety (when/how long): N/A                    Sleep: Good  Appetite:  Good  Current Medications:  Current Facility-Administered Medications  Medication Dose Route Frequency Provider Last Rate Last Admin   acetaminophen (TYLENOL) tablet 650 mg  650 mg Oral Q6H PRN Rankin, Shuvon B, NP   650 mg at 01/10/22 2317   albuterol (VENTOLIN HFA) 108 (90 Base) MCG/ACT inhaler 2 puff  2 puff Inhalation Q6H PRN Rankin, Shuvon B, NP   2 puff at 12/09/21 2102   alum & mag hydroxide-simeth (MAALOX/MYLANTA) 200-200-20 MG/5ML suspension 30 mL  30 mL Oral Q4H PRN Rankin, Shuvon B, NP   30 mL at 12/19/21 2109   apixaban (ELIQUIS) tablet 5 mg  5 mg Oral BID Rankin, Shuvon B, NP   5 mg at 01/11/22 0932   cloZAPine (CLOZARIL) tablet 50 mg  50 mg Oral Daily [name], NP   50 mg at 01/11/22 0931   cloZAPine (CLOZARIL) tablet 75 mg  75 mg Oral Dorie Rank, NP   75 mg at 01/10/22 2122   diltiazem (CARDIZEM CD) 24 hr capsule 240 mg  240 mg Oral Daily Rankin, Shuvon B, NP   240 mg at 01/11/22 0931   divalproex (DEPAKOTE ER) 24 hr tablet 500 mg  500 mg Oral BID Rankin, Shuvon B, NP   500 mg at 01/11/22 0931   fluticasone furoate-vilanterol (BREO ELLIPTA) 200-25 MCG/ACT 1 puff  1 puff Inhalation Daily Rankin, Shuvon B, NP   1 puff at 01/11/22 0900   haloperidol (HALDOL) tablet 10 mg  10 mg Oral Q8H PRN Rankin, Shuvon B, NP       insulin aspart (novoLOG) injection 0-9 Units  0-9 Units Subcutaneous TID WC Tharon Aquas, NP   1 Units at 01/11/22 1753   insulin glargine-yfgn (SEMGLEE) injection 10 Units  10 Units Subcutaneous Daily [name], DO   10 Units at 01/11/22 0945   latanoprost (XALATAN) 0.005 % ophthalmic solution 1 drop  1 drop Both Eyes QHS Rankin, Shuvon B, NP   1 drop at 01/10/22 2124   magnesium hydroxide (MILK OF MAGNESIA) suspension 30 mL  30 mL Oral Daily PRN Rankin, Shuvon B, NP       menthol-cetylpyridinium (CEPACOL)  lozenge 3 mg  1 lozenge Oral Once [name], NP       menthol-cetylpyridinium (CEPACOL) lozenge 3 mg  1 lozenge Oral PRN [name], NP   3 mg at 01/10/22 2123   metFORMIN (GLUCOPHAGE-XR) 24 hr tablet 1,000 mg  1,000 mg Oral BID WC [name], DO   1,000 mg at 01/11/22 1753   multivitamins with iron tablet 1 tablet  1 tablet Oral Daily [name], DO   1 tablet at 01/11/22 0931   nicotine (NICODERM CQ - dosed in mg/24 hr) patch 7 mg  7 mg Transdermal Daily Rankin, Shuvon B, NP   7 mg at 01/11/22 0932   polyethylene glycol (MIRALAX / GLYCOLAX) packet 17 g  17 g Oral Daily [name], DO   17 g at 01/11/22 6063   Vitamin D (Ergocalciferol) (DRISDOL) 1.25 MG (50000 UNIT) capsule 50,000 Units  50,000 Units Oral Q7 days Rankin, Shuvon B, NP   50,000 Units at 01/07/22 0160   Current Outpatient Medications  Medication Sig Dispense Refill   Accu-Chek Softclix Lancets lancets Use as directed up to four times daily 100 each 0   apixaban (ELIQUIS) 5 MG TABS tablet Take 1 tablet (5 mg total) by mouth 2 (two) times daily. 60 tablet 0   ARIPiprazole (ABILIFY) 10 MG tablet Take 1 tablet (10 mg total) by mouth daily. 30 tablet 0   Blood Glucose Monitoring Suppl (ACCU-CHEK GUIDE) w/Device KIT Use as directed up to four times daily 1 kit 0   budesonide-formoterol (SYMBICORT) 160-4.5 MCG/ACT inhaler Inhale 2 puffs into the lungs in the morning and at bedtime.     Cholecalciferol (VITAMIN D3) 1.25 MG (50000 UT) CAPS Take 50,000 Units by mouth every Thursday.     cloZAPine (CLOZARIL) 25 MG tablet Take 3 tablets (75 mg total) by mouth at bedtime. 90 tablet 0   clozapine (CLOZARIL) 50 MG tablet Take 1 tablet (50 mg total) by mouth daily. 30 tablet 0   diltiazem (CARDIZEM CD) 240 MG 24 hr capsule Take 1 capsule (240 mg total) by mouth daily. (Patient not taking: Reported on 11/24/2021) 30 capsule 0   divalproex (DEPAKOTE ER) 500 MG 24 hr tablet Take 1 tablet (500 mg total) by mouth 2 (two) times daily. 60  tablet 0   glucose blood test strip Use as directed up to four times daily 50 each 0   haloperidol (HALDOL) 10 MG tablet Take 1 tablet (10 mg total) by mouth 3 (three) times daily as needed for agitation (and psychotic symptoms).     INGREZZA 40 MG capsule Take 1 capsule (40 mg total) by mouth in the morning. 30 capsule 0   insulin aspart (NOVOLOG) 100 UNIT/ML FlexPen Before each meal 3 times a day, 140-199 - 2 units, 200-250 - 4 units, 251-299 - 6 units,  300-349 - 8 units,  350 or above 10 units. Insulin PEN if approved, provide syringes and needles if needed.Please switch to any approved short acting Insulin if needed. 15 mL 0   insulin glargine (LANTUS) 100 UNIT/ML Solostar Pen Inject 12 Units into the skin daily. 15 mL 0   Insulin Pen Needle 32G X 4 MM MISC Use 4 times a day with insulin, 1 month supply. 100 each 0   latanoprost (XALATAN) 0.005 % ophthalmic solution Place 1 drop into both eyes at bedtime.     metFORMIN (GLUCOPHAGE) 500 MG tablet Take 500 mg by mouth 2 (two) times daily with a meal.     nicotine (NICODERM CQ - DOSED IN MG/24 HR) 7 mg/24hr patch Place 1 patch (7 mg total) onto the skin daily. 28 patch 0   PROAIR HFA 108 (90 Base) MCG/ACT inhaler Inhale 2 puffs into the lungs every 6 (six) hours as needed for wheezing or shortness of breath.      Labs  Lab Results:  No results displayed because visit has over 200 results.    Admission on 11/22/2021, Discharged on 11/22/2021  Component Date Value Ref Range Status   Glucose-Capillary 11/22/2021 169 (H)  70 - 99 mg/dL Final  Glucose reference range applies only to samples taken after fasting for at least 8 hours.  No results displayed because visit has over 200 results.    Admission on 10/04/2021, Discharged on 10/22/2021  Component Date Value Ref Range Status   SARS Coronavirus 2 by RT PCR 10/04/2021 NEGATIVE  NEGATIVE Final   Comment: (NOTE) SARS-CoV-2 target nucleic acids are NOT DETECTED.  The SARS-CoV-2 RNA is  generally detectable in upper respiratory specimens during the acute phase of infection. The lowest concentration of SARS-CoV-2 viral copies this assay can detect is 138 copies/mL. A negative result does not preclude SARS-Cov-2 infection and should not be used as the sole basis for treatment or other patient management decisions. A negative result may occur with  improper specimen collection/handling, submission of specimen other than nasopharyngeal swab, presence of viral mutation(s) within the areas targeted by this assay, and inadequate number of viral copies(<138 copies/mL). A negative result must be combined with clinical observations, patient history, and epidemiological information. The expected result is Negative.  Fact Sheet for Patients:  EntrepreneurPulse.com.au  Fact Sheet for Healthcare Providers:  IncredibleEmployment.be  This test is no                          t yet approved or cleared by the [place] FDA and  has been authorized for detection and/or diagnosis of SARS-CoV-2 by FDA under an Emergency Use Authorization (EUA). This EUA will remain  in effect (meaning this test can be used) for the duration of the COVID-19 declaration under Section 564(b)(1) of the Act, 21 U.S.C.section 360bbb-3(b)(1), unless the authorization is terminated  or revoked sooner.       Influenza A by PCR 10/04/2021 NEGATIVE  NEGATIVE Final   Influenza B by PCR 10/04/2021 NEGATIVE  NEGATIVE Final   Comment: (NOTE) The Xpert Xpress SARS-CoV-2/FLU/RSV plus assay is intended as an aid in the diagnosis of influenza from Nasopharyngeal swab specimens and should not be used as a sole basis for treatment. Nasal washings and aspirates are unacceptable for Xpert Xpress SARS-CoV-2/FLU/RSV testing.  Fact Sheet for Patients: EntrepreneurPulse.com.au  Fact Sheet for Healthcare Providers: IncredibleEmployment.be  This  test is not yet approved or cleared by the [place] FDA and has been authorized for detection and/or diagnosis of SARS-CoV-2 by FDA under an Emergency Use Authorization (EUA). This EUA will remain in effect (meaning this test can be used) for the duration of the COVID-19 declaration under Section 564(b)(1) of the Act, 21 U.S.C. section 360bbb-3(b)(1), unless the authorization is terminated or revoked.  Performed at [facility], [place] [address], [place], [place] [address]    WBC 10/04/2021 8.4  4.0 - 10.5 K/uL Final   RBC 10/04/2021 4.42  3.87 - 5.11 MIL/uL Final   Hemoglobin 10/04/2021 11.6 (L)  12.0 - 15.0 g/dL Final   HCT 10/04/2021 35.7 (L)  36.0 - 46.0 % Final   MCV 10/04/2021 80.8  80.0 - 100.0 fL Final   MCH 10/04/2021 26.2  26.0 - 34.0 pg Final   MCHC 10/04/2021 32.5  30.0 - 36.0 g/dL Final   RDW 10/04/2021 16.0 (H)  11.5 - 15.5 % Final   Platelets 10/04/2021 372  150 - 400 K/uL Final   nRBC 10/04/2021 0.0  0.0 - 0.2 % Final   Neutrophils Relative % 10/04/2021 68  % Final   Neutro Abs 10/04/2021 5.7  1.7 - 7.7 K/uL Final   Lymphocytes Relative 10/04/2021 22  % Final  Lymphs Abs 10/04/2021 1.8  0.7 - 4.0 K/uL Final   Monocytes Relative 10/04/2021 8  % Final   Monocytes Absolute 10/04/2021 0.7  0.1 - 1.0 K/uL Final   Eosinophils Relative 10/04/2021 1  % Final   Eosinophils Absolute 10/04/2021 0.1  0.0 - 0.5 K/uL Final   Basophils Relative 10/04/2021 1  % Final   Basophils Absolute 10/04/2021 0.1  0.0 - 0.1 K/uL Final   Immature Granulocytes 10/04/2021 0  % Final   Abs Immature Granulocytes 10/04/2021 0.03  0.00 - 0.07 K/uL Final   Performed at [facility], [place] [address], [place], [place] [address]   Sodium 10/04/2021 136  135 - 145 mmol/L Final   Potassium 10/04/2021 4.2  3.5 - 5.1 mmol/L Final   Chloride 10/04/2021 104  98 - 111 mmol/L Final   CO2 10/04/2021 25  22 - 32 mmol/L Final   Glucose, Bld 10/04/2021 100 (H)  70 - 99 mg/dL Final   Glucose  reference range applies only to samples taken after fasting for at least 8 hours.   BUN 10/04/2021 9  6 - 20 mg/dL Final   Creatinine, Ser 10/04/2021 0.52  0.44 - 1.00 mg/dL Final   Calcium 10/04/2021 9.0  8.9 - 10.3 mg/dL Final   Total Protein 10/04/2021 7.0  6.5 - 8.1 g/dL Final   Albumin 10/04/2021 3.2 (L)  3.5 - 5.0 g/dL Final   AST 10/04/2021 13 (L)  15 - 41 U/L Final   ALT 10/04/2021 10  0 - 44 U/L Final   Alkaline Phosphatase 10/04/2021 61  38 - 126 U/L Final   Total Bilirubin 10/04/2021 0.3  0.3 - 1.2 mg/dL Final   GFR, Estimated 10/04/2021 >60  >60 mL/min Final   Comment: (NOTE) Calculated using the CKD-EPI Creatinine Equation (2021)    Anion gap 10/04/2021 7  5 - 15 Final   Performed at [facility] [address], [place], [place] [address]   Hgb A1c MFr Bld 10/04/2021 7.0 (H)  4.8 - 5.6 % Final   Comment: (NOTE) Pre diabetes:          5.7%-6.4%  Diabetes:              >6.4%  Glycemic control for   <7.0% adults with diabetes    Mean Plasma Glucose 10/04/2021 154.2  mg/dL Final   Performed at [facility], [place] [address] [place], [place] [address]   Cholesterol 10/04/2021 178  0 - 200 mg/dL Final   Triglycerides 10/04/2021 155 (H)  <150 mg/dL Final   HDL 10/04/2021 52  >40 mg/dL Final   Total CHOL/HDL Ratio 10/04/2021 3.4  RATIO Final   VLDL 10/04/2021 31  0 - 40 mg/dL Final   LDL Cholesterol 10/04/2021 95  0 - 99 mg/dL Final   Comment:        Total Cholesterol/HDL:CHD Risk Coronary Heart Disease Risk Table                     Men   Women  1/2 Average Risk   3.4   3.3  Average Risk       5.0   4.4  2 X Average Risk   9.6   7.1  3 X Average Risk  23.4   11.0        Use the calculated Patient Ratio above and the CHD Risk Table to determine the patient's CHD Risk.  ATP III CLASSIFICATION (LDL):  <100     mg/dL   Optimal  100-129  mg/dL   Near or Above                    Optimal  130-159  mg/dL   Borderline  160-189  mg/dL   High  >190      mg/dL   Very High Performed at [facility] [address], [place], [place] [address]    POC Amphetamine UR 10/04/2021 None Detected  NONE DETECTED (Cut Off Level 1000 ng/mL) Final   POC Secobarbital (BAR) 10/04/2021 None Detected  NONE DETECTED (Cut Off Level 300 ng/mL) Final   POC Buprenorphine (BUP) 10/04/2021 None Detected  NONE DETECTED (Cut Off Level 10 ng/mL) Final   POC Oxazepam (BZO) 10/04/2021 None Detected  NONE DETECTED (Cut Off Level 300 ng/mL) Final   POC Cocaine UR 10/04/2021 None Detected  NONE DETECTED (Cut Off Level 300 ng/mL) Final   POC Methamphetamine UR 10/04/2021 None Detected  NONE DETECTED (Cut Off Level 1000 ng/mL) Final   POC Morphine 10/04/2021 None Detected  NONE DETECTED (Cut Off Level 300 ng/mL) Final   POC Methadone UR 10/04/2021 None Detected  NONE DETECTED (Cut Off Level 300 ng/mL) Final   POC Oxycodone UR 10/04/2021 Positive (A)  NONE DETECTED (Cut Off Level 100 ng/mL) Final   POC Marijuana UR 10/04/2021 None Detected  NONE DETECTED (Cut Off Level 50 ng/mL) Final   SARSCOV2ONAVIRUS 2 AG 10/04/2021 NEGATIVE  NEGATIVE Final   Comment: (NOTE) SARS-CoV-2 antigen NOT DETECTED.   Negative results are presumptive.  Negative results do not preclude SARS-CoV-2 infection and should not be used as the sole basis for treatment or other patient management decisions, including infection  control decisions, particularly in the presence of clinical signs and  symptoms consistent with COVID-19, or in those who have been in contact with the virus.  Negative results must be combined with clinical observations, patient history, and epidemiological information. The expected result is Negative.  Fact Sheet for Patients: HandmadeRecipes.com.cy  Fact Sheet for Healthcare Providers: FuneralLife.at  This test is not yet approved or cleared by the [place] FDA and  has been authorized for detection and/or diagnosis of  SARS-CoV-2 by FDA under an Emergency Use Authorization (EUA).  This EUA will remain in effect (meaning this test can be used) for the duration of  the COV                          ID-19 declaration under Section 564(b)(1) of the Act, 21 U.S.C. section 360bbb-3(b)(1), unless the authorization is terminated or revoked sooner.     Valproic Acid Lvl 10/04/2021 51  50.0 - 100.0 ug/mL Final   Performed at [facility] [address] [place], [place] [address]   Valproic Acid Lvl 10/08/2021 57  50.0 - 100.0 ug/mL Final   Performed at [facility] [address], [place], [place] [address]   Glucose-Capillary 10/09/2021 123 (H)  70 - 99 mg/dL Final   Glucose reference range applies only to samples taken after fasting for at least 8 hours.  Admission on 09/09/2021, Discharged on 09/10/2021  Component Date Value Ref Range Status   Sodium 09/09/2021 134 (L)  135 - 145 mmol/L Final   Potassium 09/09/2021 4.3  3.5 - 5.1 mmol/L Final   Chloride 09/09/2021 99  98 - 111 mmol/L Final   CO2 09/09/2021 25  22 - 32 mmol/L Final   Glucose, Bld 09/09/2021 107 (H)  70 - 99 mg/dL Final   Glucose reference range applies only to samples taken after fasting for at least 8 hours.   BUN 09/09/2021 12  6 - 20 mg/dL Final   Creatinine, Ser 09/09/2021 0.74  0.44 - 1.00 mg/dL Final   Calcium 09/09/2021 9.0  8.9 - 10.3 mg/dL Final   Total Protein 09/09/2021 7.4  6.5 - 8.1 g/dL Final   Albumin 09/09/2021 3.1 (L)  3.5 - 5.0 g/dL Final   AST 09/09/2021 13 (L)  15 - 41 U/L Final   ALT 09/09/2021 13  0 - 44 U/L Final   Alkaline Phosphatase 09/09/2021 66  38 - 126 U/L Final   Total Bilirubin 09/09/2021 0.2 (L)  0.3 - 1.2 mg/dL Final   GFR, Estimated 09/09/2021 >60  >60 mL/min Final   Comment: (NOTE) Calculated using the CKD-EPI Creatinine Equation (2021)    Anion gap 09/09/2021 10  5 - 15 Final   Performed at [facility], [place] [address] [place], [place] [address]   WBC 09/09/2021 8.5  4.0 - 10.5  K/uL Final   RBC 09/09/2021 4.53  3.87 - 5.11 MIL/uL Final   Hemoglobin 09/09/2021 11.8 (L)  12.0 - 15.0 g/dL Final   HCT 09/09/2021 38.0  36.0 - 46.0 % Final   MCV 09/09/2021 83.9  80.0 - 100.0 fL Final   MCH 09/09/2021 26.0  26.0 - 34.0 pg Final   MCHC 09/09/2021 31.1  30.0 - 36.0 g/dL Final   RDW 09/09/2021 17.8 (H)  11.5 - 15.5 % Final   Platelets 09/09/2021 442 (H)  150 - 400 K/uL Final   nRBC 09/09/2021 0.0  0.0 - 0.2 % Final   Neutrophils Relative % 09/09/2021 70  % Final   Neutro Abs 09/09/2021 5.9  1.7 - 7.7 K/uL Final   Lymphocytes Relative 09/09/2021 20  % Final   Lymphs Abs 09/09/2021 1.7  0.7 - 4.0 K/uL Final   Monocytes Relative 09/09/2021 8  % Final   Monocytes Absolute 09/09/2021 0.7  0.1 - 1.0 K/uL Final   Eosinophils Relative 09/09/2021 1  % Final   Eosinophils Absolute 09/09/2021 0.1  0.0 - 0.5 K/uL Final   Basophils Relative 09/09/2021 1  % Final   Basophils Absolute 09/09/2021 0.1  0.0 - 0.1 K/uL Final   Immature Granulocytes 09/09/2021 0  % Final   Abs Immature Granulocytes 09/09/2021 0.03  0.00 - 0.07 K/uL Final   Performed at [facility], [place] [address] [place], [place] [address]   Ammonia 09/09/2021 37 (H)  9 - 35 umol/L Final   Comment: HEMOLYSIS AT THIS LEVEL MAY AFFECT RESULT Performed at [facility], [place] [address], [place], [place] [address]    Opiates 09/09/2021 NONE DETECTED  NONE DETECTED Final   Cocaine 09/09/2021 NONE DETECTED  NONE DETECTED Final   Benzodiazepines 09/09/2021 NONE DETECTED  NONE DETECTED Final   Amphetamines 09/09/2021 NONE DETECTED  NONE DETECTED Final   Tetrahydrocannabinol 09/09/2021 NONE DETECTED  NONE DETECTED Final   Barbiturates 09/09/2021 NONE DETECTED  NONE DETECTED Final   Comment: (NOTE) DRUG SCREEN FOR MEDICAL PURPOSES ONLY.  IF CONFIRMATION IS NEEDED FOR ANY PURPOSE, NOTIFY LAB WITHIN 5 DAYS.  LOWEST DETECTABLE LIMITS FOR URINE DRUG SCREEN Drug Class                     Cutoff (ng/mL) Amphetamine  and  metabolites    1000 Barbiturate and metabolites    200 Benzodiazepine                 269 Tricyclics and metabolites     300 Opiates and metabolites        300 Cocaine and metabolites        300 THC                            50 Performed at [facility], [place] [address], [place], [place] [address]    Alcohol, Ethyl (B) 09/09/2021 <10  <10 mg/dL Final   Comment: (NOTE) Lowest detectable limit for serum alcohol is 10 mg/dL.  For medical purposes only. Performed at [facility], [place] [address], [place], [place] [address]    Lipase 09/09/2021 33  11 - 51 U/L Final   Performed at [facility] [address] [place], [place] [address]   Color, Urine 09/09/2021 COLORLESS (A)  YELLOW Final   APPearance 09/09/2021 CLEAR  CLEAR Final   Specific Gravity, Urine 09/09/2021 1.002 (L)  1.005 - 1.030 Final   pH 09/09/2021 6.0  5.0 - 8.0 Final   Glucose, UA 09/09/2021 NEGATIVE  NEGATIVE mg/dL Final   Hgb urine dipstick 09/09/2021 NEGATIVE  NEGATIVE Final   Bilirubin Urine 09/09/2021 NEGATIVE  NEGATIVE Final   Ketones, ur 09/09/2021 NEGATIVE  NEGATIVE mg/dL Final   Protein, ur 09/09/2021 NEGATIVE  NEGATIVE mg/dL Final   Nitrite 09/09/2021 NEGATIVE  NEGATIVE Final   Leukocytes,Ua 09/09/2021 NEGATIVE  NEGATIVE Final   Performed at [facility] [address] [place], [place] [address]   Specimen Description 09/09/2021 URINE, CLEAN CATCH   Final   Special Requests 09/09/2021 NONE   Final   Culture 09/09/2021  (A)   Final                   Value:<10,000 COLONIES/mL INSIGNIFICANT GROWTH Performed at [facility], [place] [address], [place], [place] [address]    Report Status 09/09/2021 09/10/2021 FINAL   Final   D-Dimer, Quant 09/09/2021 0.92 (H)  0.00 - 0.50 ug/mL-FEU Final   Comment: (NOTE) At the manufacturer cut-off value of 0.5 g/mL FEU, this assay has a negative predictive value of 95-100%.This assay is intended for use in conjunction with a clinical  pretest probability (PTP) assessment model to exclude pulmonary embolism (PE) and deep venous thrombosis (DVT) in outpatients suspected of PE or DVT. Results should be correlated with clinical presentation. Performed at [facility], [place] [address] [place], [place] [address]    Troponin I (High Sensitivity) 09/09/2021 5  <18 ng/L Final   Comment: (NOTE) Elevated high sensitivity troponin I (hsTnI) values and significant  changes across serial measurements may suggest ACS but many other  chronic and acute conditions are known to elevate hsTnI results.  Refer to the "Links" section for chest pain algorithms and additional  guidance. Performed at [facility], [place] [address], [place], [place] [address]    BP 09/10/2021 135/83  mmHg Final   S' Lateral 09/10/2021 2.30  cm Final   Area-P 1/2 09/10/2021 5.58  cm2 Final    Blood Alcohol level:  Lab Results  Component Value Date   ETH <10 11/23/2021   ETH <10 51/04/5850    Metabolic Disorder Labs: Lab Results  Component Value Date   HGBA1C 6.7 (H) 12/28/2021  MPG 145.59 12/28/2021   MPG 154.2 10/04/2021   Lab Results  Component Value Date   PROLACTIN 3.8 (L) 11/23/2021   Lab Results  Component Value Date   CHOL 171 11/23/2021   TRIG 125 11/23/2021   HDL 65 11/23/2021   CHOLHDL 2.6 11/23/2021   VLDL 25 11/23/2021   LDLCALC 81 11/23/2021   LDLCALC 95 10/04/2021    Therapeutic Lab Levels: No results found for: "LITHIUM" Lab Results  Component Value Date   VALPROATE 33 (L) 12/28/2021   VALPROATE 54 12/03/2021   No results found for: "CBMZ"  Physical Findings   PHQ2-9    [facility] from 11/23/2021 in [facility]  PHQ-2 Total Score 2  PHQ-9 Total Score 3      Flowsheet Row ED from 11/23/2021 in [facility] Most recent reading at 11/23/2021  6:44 PM ED from 11/22/2021 in [place] DE[facility] Most recent reading at  11/22/2021  8:28 PM ED from 11/22/2021 in [place] DEPT Most recent reading at 11/22/2021  1:24 AM  C-SSRS RISK CATEGORY No Risk No Risk No Risk        Musculoskeletal  Strength & Muscle Tone: within normal limits Gait & Station: normal Patient leans: N/A  Psychiatric Specialty Exam  Presentation  General Appearance:  Appropriate for Environment  Eye Contact: Fair  Speech: Clear and Coherent; Normal Rate  Speech Volume: Normal  Handedness: Right   Mood and Affect  Mood: Euthymic  Affect: Congruent   Thought Process  Thought Processes: Coherent  Descriptions of Associations:Intact  Orientation:Full (Time, Place and Person)  Thought Content:Logical  Diagnosis of Schizophrenia or Schizoaffective disorder in past: Yes  Duration of Psychotic Symptoms: No data recorded  Hallucinations:Hallucinations: None  Ideas of Reference:None  Suicidal Thoughts:Suicidal Thoughts: No  Homicidal Thoughts:Homicidal Thoughts: No   Sensorium  Memory: Immediate Fair; Recent Fair; Remote Fair  Judgment: Intact  Insight: Present   Executive Functions  Concentration: Fair  Attention Span: Fair  Recall: [employer] of Knowledge: Fair  Language: Fair   Psychomotor Activity  Psychomotor Activity: Psychomotor Activity: Normal   Assets  Assets: Communication Skills; Desire for Improvement; Financial Resources/Insurance; Physical Health; Resilience; Social Support; Leisure Time   Sleep  Sleep: Sleep: Good   No data recorded  Physical Exam  Physical Exam Vitals and nursing note reviewed.  Constitutional:      General: She is not in acute distress.    Appearance: Normal appearance. She is not ill-appearing.  HENT:     Head: Normocephalic.  Eyes:     General:        Right eye: No discharge.        Left eye: No discharge.     Conjunctiva/sclera: Conjunctivae normal.  Cardiovascular:     Rate and Rhythm: Normal  rate.  Pulmonary:     Effort: Pulmonary effort is normal.  Musculoskeletal:        General: Normal range of motion.     Cervical back: Normal range of motion.  Skin:    General: Skin is warm.     Coloration: Skin is not jaundiced or pale.  Neurological:     Mental Status: She is alert and oriented to person, place, and time.  Psychiatric:        Attention and Perception: Attention and perception normal.        Mood and Affect: Mood and affect normal.  Speech: Speech normal.        Behavior: Behavior normal. Behavior is cooperative.        Thought Content: Thought content normal.        Cognition and Memory: Cognition normal.        Judgment: Judgment normal.    Review of Systems  Constitutional: Negative.   HENT: Negative.    Eyes: Negative.   Respiratory: Negative.    Cardiovascular: Negative.   Musculoskeletal: Negative.   Skin: Negative.   Neurological: Negative.   Psychiatric/Behavioral: Negative.     Blood pressure 111/78, pulse 97, temperature 97.9 F (36.6 C), temperature source Tympanic, resp. rate 20, SpO2 97 %. There is no height or weight on file to calculate BMI.  Treatment Plan Summary:  Patient continues to remain psychiatrically cleared.  Plan disposition is pending placement.  Social work and DSS are actively seeking placement.  Revonda Humphrey, NP 01/11/2022 6:25 PM

## 2022-01-11 NOTE — ED Notes (Signed)
Pt is awake@this  time, sitting on the side of the bed calm and cooperative. Alert and orient x 4. No c/o pain or distress. Will continue to monitor for safety

## 2022-01-11 NOTE — Progress Notes (Signed)
LCSW Progress Note  1159 - Spoke with Denton Lank, [phone], care manager at [facility] regarding progress on funding and other paperwork the group home is requesting.  Ms. Teresa Murillo stated that she will be speaking with her supervisor this afternoon regarding the process to submit requests for more funding and who will be responsible for submitting the Treatment Authorization Request (TAR).  LCSW updated the pt on what is going on with the process.   Hansel Starling, MSW, LCSW [facility] [phone] phone [phone] fax

## 2022-01-12 DIAGNOSIS — F209 Schizophrenia, unspecified: Secondary | ICD-10-CM | POA: Diagnosis not present

## 2022-01-12 LAB — GLUCOSE, CAPILLARY
Glucose-Capillary: 106 mg/dL — ABNORMAL HIGH (ref 70–99)
Glucose-Capillary: 117 mg/dL — ABNORMAL HIGH (ref 70–99)
Glucose-Capillary: 217 mg/dL — ABNORMAL HIGH (ref 70–99)
Glucose-Capillary: 152 mg/dL — ABNORMAL HIGH (ref 70–99)

## 2022-01-12 NOTE — ED Notes (Signed)
Pt sleeping@this time. Breathing even and unlabored. Will continue to monitor for safety 

## 2022-01-12 NOTE — ED Notes (Signed)
Patient A&Ox4. Denies intent to harm self/others when asked. Denies A/VH. Patient denies any physical complaints when asked. No acute distress noted. Routine safety checks conducted according to facility protocol. Environment secured. Encouraged patient to notify staff if thoughts of harm toward self or others arise. Patient verbalize understanding and agreement. Will continue to monitor for safety.

## 2022-01-12 NOTE — ED Notes (Signed)
Pt sleeping in no acute distress. RR even and unlabored. Environment secured. Will continue to monitor for safety. 

## 2022-01-12 NOTE — ED Provider Notes (Signed)
Behavioral Health Progress Note  Date and Time: 01/12/2022 5:57 PM Name: Teresa Murillo MRN:  [id number]  Subjective:  Subjective:  Teresa Murillo 61 y.o., female patient who initially presented to [facility] on 11/23/2021.  At this time she had been discharged from her group home.  She was admitted to the continuous assessment unit while social work and DSS are seeking new placement.  She is boarding at [facility].   Teresa Murillo, 61 y.o., female patient seen face to face by this provider, consulted with Dr. Dwyane Dee; and chart reviewed on 01/12/22.  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   While on the unit she received an 8 ID to capacity evaluation (ACE) on 12/11/2021.  She also completed a Wechsler Adult intelligence scale-4 on 12/14/2021.  Placing her in the borderline range of intellectual functioning.   During evaluation Teresa Murillo is observed sitting and talking to another patient.  She is smiling and interacting appropriately.  She has normal speech and behavior.  She is alert/oriented x4, cooperative and attentive.  She continues to deny any concerns with appetite or sleep.  She continues to deny any depressive symptoms.  Reports she did not feel well earlier in the day she was having some abdominal discomfort but states those symptoms has resolved now and that it was just gas.  She continues to ask for updates on her placement.  She expresses her concerns that she hopes that we do not just put her on the street.  She states, "I would not have anywhere to go".  She denies SI/HI/AVH.  She does not appear to be responding to internal/external stimuli.  Social work continues to seek placement.  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 On the Wechsler Adult Intelligence Scale-4, Ms. Agramonte achieved a full-scale IQ score of 73 and a percentile rank of 4 placing her in the borderline range of intellectual functioning.    Chronic obstructive pulmonary disease (COPD) (HCC)    Glaucoma    Hyperlipidemia    Hypertension    Iron deficiency    Schizoaffective disorder (HCC)    Type 2 diabetes mellitus (HCC)     Past Surgical History:  Procedure Laterality Date   TUBAL LIGATION     Family History:  Family History  Problem Relation Age of Onset   Breast cancer Maternal Grandmother    Family Psychiatric  History: See H&P Social History:  Social History   Substance and Sexual Activity  Alcohol Use Yes     Social History   Substance and Sexual Activity  Drug Use Not Currently    Social History   Socioeconomic History   Marital status: Divorced    Spouse name: Not on file   Number of children: Not on file   Years of education: Not on file   Highest education level: Not on file  Occupational History   Not on file  Tobacco Use   Smoking status: Every Day    Packs/day: 1.00    Types: Cigars, Cigarettes   Smokeless tobacco: Current  Vaping Use   Vaping Use: Never used  Substance and Sexual Activity   Alcohol use: Yes   Drug use: Not Currently   Sexual activity: Not Currently    Birth control/protection: Surgical  Other Topics Concern   Not on file  Social History Narrative   Not on file   Social Determinants of Health   Financial Resource Strain: Not on file  Food Insecurity: Not on file  Transportation Needs: Not on file  Physical Activity: Not on file  Stress: Not on file  Social Connections: Not on file   SDOH:  SDOH Screenings   Depression (PHQ2-9): Low Risk  (11/23/2021)   Tobacco Use: High Risk (12/30/2021)   Additional Social History:    Pain Medications: See MAR Prescriptions: See MAR Over the Counter: See MAR History of alcohol / drug use?: No history of alcohol / drug abuse Longest period of sobriety (when/how long): N/A                    Sleep: Good  Appetite:  Good  Current Medications:  Current Facility-Administered Medications  Medication Dose Route Frequency Provider Last Rate Last Admin   acetaminophen (TYLENOL) tablet 650 mg  650 mg Oral Q6H PRN Rankin, Shuvon B, NP   650 mg at 01/11/22 2337   albuterol (VENTOLIN HFA) 108 (90 Base) MCG/ACT inhaler 2 puff  2 puff Inhalation Q6H PRN Rankin, Shuvon B, NP   2 puff at 12/09/21 2102   alum & mag hydroxide-simeth (MAALOX/MYLANTA) 200-200-20 MG/5ML suspension 30 mL  30 mL Oral Q4H PRN Rankin, Shuvon B, NP   30 mL at 12/19/21 2109   apixaban (ELIQUIS) tablet 5 mg  5 mg Oral BID Rankin, Shuvon B, NP   5 mg at 01/12/22 0946   cloZAPine (CLOZARIL) tablet 50 mg  50 mg Oral Daily [name], NP   50 mg at 01/12/22 0946   cloZAPine (CLOZARIL) tablet 75 mg  75 mg Oral Dorie Rank, NP   75 mg at 01/11/22 2148   diltiazem (CARDIZEM CD) 24 hr capsule 240 mg  240 mg Oral Daily Rankin, Shuvon B, NP   240 mg at 01/12/22 0950   divalproex (DEPAKOTE ER) 24 hr tablet 500 mg  500 mg Oral BID Rankin, Shuvon B, NP   500 mg at 01/12/22 0950   fluticasone furoate-vilanterol (BREO ELLIPTA) 200-25 MCG/ACT 1 puff  1 puff Inhalation Daily Rankin, Shuvon B, NP   1 puff at 01/12/22 0900   haloperidol (HALDOL) tablet 10 mg  10 mg Oral Q8H PRN Rankin, Shuvon B, NP       insulin aspart (novoLOG) injection 0-9 Units  0-9 Units Subcutaneous TID WC Tharon Aquas, NP   3 Units at 01/12/22 1748   insulin glargine-yfgn (SEMGLEE) injection 10 Units  10 Units Subcutaneous Daily [name], DO   10 Units at 01/12/22 0952   latanoprost (XALATAN) 0.005 % ophthalmic solution 1 drop  1 drop Both Eyes QHS Rankin,  Shuvon B, NP   1 drop at 01/11/22 2222   magnesium hydroxide (MILK OF MAGNESIA) suspension 30 mL  30 mL Oral Daily PRN Rankin, Shuvon B, NP       menthol-cetylpyridinium (CEPACOL) lozenge 3 mg  1 lozenge Oral Once [name], NP       menthol-cetylpyridinium (CEPACOL) lozenge 3 mg  1 lozenge Oral PRN [name], NP   3 mg at 01/11/22 2305   metFORMIN (GLUCOPHAGE-XR) 24 hr tablet 1,000 mg  1,000 mg Oral BID WC [name], DO  1,000 mg at 01/12/22 1748   multivitamins with iron tablet 1 tablet  1 tablet Oral Daily [name], DO   1 tablet at 01/12/22 0946   nicotine (NICODERM CQ - dosed in mg/24 hr) patch 7 mg  7 mg Transdermal Daily Rankin, Shuvon B, NP   7 mg at 01/12/22 0948   polyethylene glycol (MIRALAX / GLYCOLAX) packet 17 g  17 g Oral Daily [name], DO   17 g at 01/12/22 3295   Vitamin D (Ergocalciferol) (DRISDOL) 1.25 MG (50000 UNIT) capsule 50,000 Units  50,000 Units Oral Q7 days Rankin, Shuvon B, NP   50,000 Units at 01/07/22 1884   Current Outpatient Medications  Medication Sig Dispense Refill   Accu-Chek Softclix Lancets lancets Use as directed up to four times daily 100 each 0   apixaban (ELIQUIS) 5 MG TABS tablet Take 1 tablet (5 mg total) by mouth 2 (two) times daily. 60 tablet 0   ARIPiprazole (ABILIFY) 10 MG tablet Take 1 tablet (10 mg total) by mouth daily. 30 tablet 0   Blood Glucose Monitoring Suppl (ACCU-CHEK GUIDE) w/Device KIT Use as directed up to four times daily 1 kit 0   budesonide-formoterol (SYMBICORT) 160-4.5 MCG/ACT inhaler Inhale 2 puffs into the lungs in the morning and at bedtime.     Cholecalciferol (VITAMIN D3) 1.25 MG (50000 UT) CAPS Take 50,000 Units by mouth every Thursday.     cloZAPine (CLOZARIL) 25 MG tablet Take 3 tablets (75 mg total) by mouth at bedtime. 90 tablet 0   clozapine (CLOZARIL) 50 MG tablet Take 1 tablet (50 mg total) by mouth daily. 30 tablet 0   diltiazem (CARDIZEM CD) 240 MG 24 hr capsule Take 1 capsule (240 mg total)  by mouth daily. (Patient not taking: Reported on 11/24/2021) 30 capsule 0   divalproex (DEPAKOTE ER) 500 MG 24 hr tablet Take 1 tablet (500 mg total) by mouth 2 (two) times daily. 60 tablet 0   glucose blood test strip Use as directed up to four times daily 50 each 0   haloperidol (HALDOL) 10 MG tablet Take 1 tablet (10 mg total) by mouth 3 (three) times daily as needed for agitation (and psychotic symptoms).     INGREZZA 40 MG capsule Take 1 capsule (40 mg total) by mouth in the morning. 30 capsule 0   insulin aspart (NOVOLOG) 100 UNIT/ML FlexPen Before each meal 3 times a day, 140-199 - 2 units, 200-250 - 4 units, 251-299 - 6 units,  300-349 - 8 units,  350 or above 10 units. Insulin PEN if approved, provide syringes and needles if needed.Please switch to any approved short acting Insulin if needed. 15 mL 0   insulin glargine (LANTUS) 100 UNIT/ML Solostar Pen Inject 12 Units into the skin daily. 15 mL 0   Insulin Pen Needle 32G X 4 MM MISC Use 4 times a day with insulin, 1 month supply. 100 each 0   latanoprost (XALATAN) 0.005 % ophthalmic solution Place 1 drop into both eyes at bedtime.     metFORMIN (GLUCOPHAGE) 500 MG tablet Take 500 mg by mouth 2 (two) times daily with a meal.     nicotine (NICODERM CQ - DOSED IN MG/24 HR) 7 mg/24hr patch Place 1 patch (7 mg total) onto the skin daily. 28 patch 0   PROAIR HFA 108 (90 Base) MCG/ACT inhaler Inhale 2 puffs into the lungs every 6 (six) hours as needed for wheezing or shortness of breath.      Labs  Lab Results:  No results displayed because visit has over 200 results.    Admission on 11/22/2021, Discharged on 11/22/2021  Component Date Value Ref Range Status   Glucose-Capillary 11/22/2021 169 (H)  70 - 99 mg/dL Final   Glucose reference range applies only to samples taken after fasting for at least 8 hours.  No results displayed because visit has over 200 results.    Admission on 10/04/2021, Discharged on 10/22/2021  Component Date Value  Ref Range Status   SARS Coronavirus 2 by RT PCR 10/04/2021 NEGATIVE  NEGATIVE Final   Comment: (NOTE) SARS-CoV-2 target nucleic acids are NOT DETECTED.  The SARS-CoV-2 RNA is generally detectable in upper respiratory specimens during the acute phase of infection. The lowest concentration of SARS-CoV-2 viral copies this assay can detect is 138 copies/mL. A negative result does not preclude SARS-Cov-2 infection and should not be used as the sole basis for treatment or other patient management decisions. A negative result may occur with  improper specimen collection/handling, submission of specimen other than nasopharyngeal swab, presence of viral mutation(s) within the areas targeted by this assay, and inadequate number of viral copies(<138 copies/mL). A negative result must be combined with clinical observations, patient history, and epidemiological information. The expected result is Negative.  Fact Sheet for Patients:  EntrepreneurPulse.com.au  Fact Sheet for Healthcare Providers:  IncredibleEmployment.be  This test is no                          t yet approved or cleared by the [place] FDA and  has been authorized for detection and/or diagnosis of SARS-CoV-2 by FDA under an Emergency Use Authorization (EUA). This EUA will remain  in effect (meaning this test can be used) for the duration of the COVID-19 declaration under Section 564(b)(1) of the Act, 21 U.S.C.section 360bbb-3(b)(1), unless the authorization is terminated  or revoked sooner.       Influenza A by PCR 10/04/2021 NEGATIVE  NEGATIVE Final   Influenza B by PCR 10/04/2021 NEGATIVE  NEGATIVE Final   Comment: (NOTE) The Xpert Xpress SARS-CoV-2/FLU/RSV plus assay is intended as an aid in the diagnosis of influenza from Nasopharyngeal swab specimens and should not be used as a sole basis for treatment. Nasal washings and aspirates are unacceptable for Xpert Xpress  SARS-CoV-2/FLU/RSV testing.  Fact Sheet for Patients: EntrepreneurPulse.com.au  Fact Sheet for Healthcare Providers: IncredibleEmployment.be  This test is not yet approved or cleared by the [place] FDA and has been authorized for detection and/or diagnosis of SARS-CoV-2 by FDA under an Emergency Use Authorization (EUA). This EUA will remain in effect (meaning this test can be used) for the duration of the COVID-19 declaration under Section 564(b)(1) of the Act, 21 U.S.C. section 360bbb-3(b)(1), unless the authorization is terminated or revoked.  Performed at [facility], [place] [address] [place], [place] [address]    WBC 10/04/2021 8.4  4.0 - 10.5 K/uL Final   RBC 10/04/2021 4.42  3.87 - 5.11 MIL/uL Final   Hemoglobin 10/04/2021 11.6 (L)  12.0 - 15.0 g/dL Final   HCT 10/04/2021 35.7 (L)  36.0 - 46.0 % Final   MCV 10/04/2021 80.8  80.0 - 100.0 fL Final   MCH 10/04/2021 26.2  26.0 - 34.0 pg Final   MCHC 10/04/2021 32.5  30.0 - 36.0 g/dL Final   RDW 10/04/2021 16.0 (H)  11.5 - 15.5 % Final   Platelets 10/04/2021 372  150 - 400 K/uL Final  nRBC 10/04/2021 0.0  0.0 - 0.2 % Final   Neutrophils Relative % 10/04/2021 68  % Final   Neutro Abs 10/04/2021 5.7  1.7 - 7.7 K/uL Final   Lymphocytes Relative 10/04/2021 22  % Final   Lymphs Abs 10/04/2021 1.8  0.7 - 4.0 K/uL Final   Monocytes Relative 10/04/2021 8  % Final   Monocytes Absolute 10/04/2021 0.7  0.1 - 1.0 K/uL Final   Eosinophils Relative 10/04/2021 1  % Final   Eosinophils Absolute 10/04/2021 0.1  0.0 - 0.5 K/uL Final   Basophils Relative 10/04/2021 1  % Final   Basophils Absolute 10/04/2021 0.1  0.0 - 0.1 K/uL Final   Immature Granulocytes 10/04/2021 0  % Final   Abs Immature Granulocytes 10/04/2021 0.03  0.00 - 0.07 K/uL Final   Performed at [facility], [place] [address], [place], [place] [address]   Sodium 10/04/2021 136  135 - 145 mmol/L Final   Potassium 10/04/2021  4.2  3.5 - 5.1 mmol/L Final   Chloride 10/04/2021 104  98 - 111 mmol/L Final   CO2 10/04/2021 25  22 - 32 mmol/L Final   Glucose, Bld 10/04/2021 100 (H)  70 - 99 mg/dL Final   Glucose reference range applies only to samples taken after fasting for at least 8 hours.   BUN 10/04/2021 9  6 - 20 mg/dL Final   Creatinine, Ser 10/04/2021 0.52  0.44 - 1.00 mg/dL Final   Calcium 10/04/2021 9.0  8.9 - 10.3 mg/dL Final   Total Protein 10/04/2021 7.0  6.5 - 8.1 g/dL Final   Albumin 10/04/2021 3.2 (L)  3.5 - 5.0 g/dL Final   AST 10/04/2021 13 (L)  15 - 41 U/L Final   ALT 10/04/2021 10  0 - 44 U/L Final   Alkaline Phosphatase 10/04/2021 61  38 - 126 U/L Final   Total Bilirubin 10/04/2021 0.3  0.3 - 1.2 mg/dL Final   GFR, Estimated 10/04/2021 >60  >60 mL/min Final   Comment: (NOTE) Calculated using the CKD-EPI Creatinine Equation (2021)    Anion gap 10/04/2021 7  5 - 15 Final   Performed at [place] [address], [place], [place] [address]   Hgb A1c MFr Bld 10/04/2021 7.0 (H)  4.8 - 5.6 % Final   Comment: (NOTE) Pre diabetes:          5.7%-6.4%  Diabetes:              >6.4%  Glycemic control for   <7.0% adults with diabetes    Mean Plasma Glucose 10/04/2021 154.2  mg/dL Final   Performed at [facility], [place] [address], [place], [place] [address]   Cholesterol 10/04/2021 178  0 - 200 mg/dL Final   Triglycerides 10/04/2021 155 (H)  <150 mg/dL Final   HDL 10/04/2021 52  >40 mg/dL Final   Total CHOL/HDL Ratio 10/04/2021 3.4  RATIO Final   VLDL 10/04/2021 31  0 - 40 mg/dL Final   LDL Cholesterol 10/04/2021 95  0 - 99 mg/dL Final   Comment:        Total Cholesterol/HDL:CHD Risk Coronary Heart Disease Risk Table                     Men   Women  1/2 Average Risk   3.4   3.3  Average Risk       5.0   4.4  2 X Average Risk   9.6  7.1  3 X Average Risk  23.4   11.0        Use the calculated Patient Ratio above and the CHD Risk Table to determine the patient's CHD Risk.         ATP III CLASSIFICATION (LDL):  <100     mg/dL   Optimal  100-129  mg/dL   Near or Above                    Optimal  130-159  mg/dL   Borderline  160-189  mg/dL   High  >190     mg/dL   Very High Performed at [facility] [address], [place], [place] [address]    POC Amphetamine UR 10/04/2021 None Detected  NONE DETECTED (Cut Off Level 1000 ng/mL) Final   POC Secobarbital (BAR) 10/04/2021 None Detected  NONE DETECTED (Cut Off Level 300 ng/mL) Final   POC Buprenorphine (BUP) 10/04/2021 None Detected  NONE DETECTED (Cut Off Level 10 ng/mL) Final   POC Oxazepam (BZO) 10/04/2021 None Detected  NONE DETECTED (Cut Off Level 300 ng/mL) Final   POC Cocaine UR 10/04/2021 None Detected  NONE DETECTED (Cut Off Level 300 ng/mL) Final   POC Methamphetamine UR 10/04/2021 None Detected  NONE DETECTED (Cut Off Level 1000 ng/mL) Final   POC Morphine 10/04/2021 None Detected  NONE DETECTED (Cut Off Level 300 ng/mL) Final   POC Methadone UR 10/04/2021 None Detected  NONE DETECTED (Cut Off Level 300 ng/mL) Final   POC Oxycodone UR 10/04/2021 Positive (A)  NONE DETECTED (Cut Off Level 100 ng/mL) Final   POC Marijuana UR 10/04/2021 None Detected  NONE DETECTED (Cut Off Level 50 ng/mL) Final   SARSCOV2ONAVIRUS 2 AG 10/04/2021 NEGATIVE  NEGATIVE Final   Comment: (NOTE) SARS-CoV-2 antigen NOT DETECTED.   Negative results are presumptive.  Negative results do not preclude SARS-CoV-2 infection and should not be used as the sole basis for treatment or other patient management decisions, including infection  control decisions, particularly in the presence of clinical signs and  symptoms consistent with COVID-19, or in those who have been in contact with the virus.  Negative results must be combined with clinical observations, patient history, and epidemiological information. The expected result is Negative.  Fact Sheet for Patients: HandmadeRecipes.com.cy  Fact Sheet for  Healthcare Providers: FuneralLife.at  This test is not yet approved or cleared by the [place] FDA and  has been authorized for detection and/or diagnosis of SARS-CoV-2 by FDA under an Emergency Use Authorization (EUA).  This EUA will remain in effect (meaning this test can be used) for the duration of  the COV                          ID-19 declaration under Section 564(b)(1) of the Act, 21 U.S.C. section 360bbb-3(b)(1), unless the authorization is terminated or revoked sooner.     Valproic Acid Lvl 10/04/2021 51  50.0 - 100.0 ug/mL Final   Performed at [facility] [address] [place], [place] [address]   Valproic Acid Lvl 10/08/2021 57  50.0 - 100.0 ug/mL Final   Performed at [facility] [address] [place], [place] [address]   Glucose-Capillary 10/09/2021 123 (H)  70 - 99 mg/dL Final   Glucose reference range applies only to samples taken after fasting for at least 8 hours.  Admission on 09/09/2021, Discharged on 09/10/2021  Component Date Value  Ref Range Status   Sodium 09/09/2021 134 (L)  135 - 145 mmol/L Final   Potassium 09/09/2021 4.3  3.5 - 5.1 mmol/L Final   Chloride 09/09/2021 99  98 - 111 mmol/L Final   CO2 09/09/2021 25  22 - 32 mmol/L Final   Glucose, Bld 09/09/2021 107 (H)  70 - 99 mg/dL Final   Glucose reference range applies only to samples taken after fasting for at least 8 hours.   BUN 09/09/2021 12  6 - 20 mg/dL Final   Creatinine, Ser 09/09/2021 0.74  0.44 - 1.00 mg/dL Final   Calcium 09/09/2021 9.0  8.9 - 10.3 mg/dL Final   Total Protein 09/09/2021 7.4  6.5 - 8.1 g/dL Final   Albumin 09/09/2021 3.1 (L)  3.5 - 5.0 g/dL Final   AST 09/09/2021 13 (L)  15 - 41 U/L Final   ALT 09/09/2021 13  0 - 44 U/L Final   Alkaline Phosphatase 09/09/2021 66  38 - 126 U/L Final   Total Bilirubin 09/09/2021 0.2 (L)  0.3 - 1.2 mg/dL Final   GFR, Estimated 09/09/2021 >60  >60 mL/min Final   Comment: (NOTE) Calculated using  the CKD-EPI Creatinine Equation (2021)    Anion gap 09/09/2021 10  5 - 15 Final   Performed at [facility], [place] [address], [place], [place] [address]   WBC 09/09/2021 8.5  4.0 - 10.5 K/uL Final   RBC 09/09/2021 4.53  3.87 - 5.11 MIL/uL Final   Hemoglobin 09/09/2021 11.8 (L)  12.0 - 15.0 g/dL Final   HCT 09/09/2021 38.0  36.0 - 46.0 % Final   MCV 09/09/2021 83.9  80.0 - 100.0 fL Final   MCH 09/09/2021 26.0  26.0 - 34.0 pg Final   MCHC 09/09/2021 31.1  30.0 - 36.0 g/dL Final   RDW 09/09/2021 17.8 (H)  11.5 - 15.5 % Final   Platelets 09/09/2021 442 (H)  150 - 400 K/uL Final   nRBC 09/09/2021 0.0  0.0 - 0.2 % Final   Neutrophils Relative % 09/09/2021 70  % Final   Neutro Abs 09/09/2021 5.9  1.7 - 7.7 K/uL Final   Lymphocytes Relative 09/09/2021 20  % Final   Lymphs Abs 09/09/2021 1.7  0.7 - 4.0 K/uL Final   Monocytes Relative 09/09/2021 8  % Final   Monocytes Absolute 09/09/2021 0.7  0.1 - 1.0 K/uL Final   Eosinophils Relative 09/09/2021 1  % Final   Eosinophils Absolute 09/09/2021 0.1  0.0 - 0.5 K/uL Final   Basophils Relative 09/09/2021 1  % Final   Basophils Absolute 09/09/2021 0.1  0.0 - 0.1 K/uL Final   Immature Granulocytes 09/09/2021 0  % Final   Abs Immature Granulocytes 09/09/2021 0.03  0.00 - 0.07 K/uL Final   Performed at [facility], [place] [address] [place], [place] [address]   Ammonia 09/09/2021 37 (H)  9 - 35 umol/L Final   Comment: HEMOLYSIS AT THIS LEVEL MAY AFFECT RESULT Performed at [facility], [place] [address] [place], [place] [address]    Opiates 09/09/2021 NONE DETECTED  NONE DETECTED Final   Cocaine 09/09/2021 NONE DETECTED  NONE DETECTED Final   Benzodiazepines 09/09/2021 NONE DETECTED  NONE DETECTED Final   Amphetamines 09/09/2021 NONE DETECTED  NONE DETECTED Final   Tetrahydrocannabinol 09/09/2021 NONE DETECTED  NONE DETECTED Final   Barbiturates 09/09/2021 NONE DETECTED  NONE DETECTED Final   Comment: (NOTE) DRUG SCREEN FOR MEDICAL  PURPOSES ONLY.  IF CONFIRMATION IS  NEEDED FOR ANY PURPOSE, NOTIFY LAB WITHIN 5 DAYS.  LOWEST DETECTABLE LIMITS FOR URINE DRUG SCREEN Drug Class                     Cutoff (ng/mL) Amphetamine and metabolites    1000 Barbiturate and metabolites    200 Benzodiazepine                 122 Tricyclics and metabolites     300 Opiates and metabolites        300 Cocaine and metabolites        300 THC                            50 Performed at [facility], [place] [address] [place], [place] [address]    Alcohol, Ethyl (B) 09/09/2021 <10  <10 mg/dL Final   Comment: (NOTE) Lowest detectable limit for serum alcohol is 10 mg/dL.  For medical purposes only. Performed at [facility], [place] [address], [place], [place] [address]    Lipase 09/09/2021 33  11 - 51 U/L Final   Performed at [facility] [address] [place], [place] [address]   Color, Urine 09/09/2021 COLORLESS (A)  YELLOW Final   APPearance 09/09/2021 CLEAR  CLEAR Final   Specific Gravity, Urine 09/09/2021 1.002 (L)  1.005 - 1.030 Final   pH 09/09/2021 6.0  5.0 - 8.0 Final   Glucose, UA 09/09/2021 NEGATIVE  NEGATIVE mg/dL Final   Hgb urine dipstick 09/09/2021 NEGATIVE  NEGATIVE Final   Bilirubin Urine 09/09/2021 NEGATIVE  NEGATIVE Final   Ketones, ur 09/09/2021 NEGATIVE  NEGATIVE mg/dL Final   Protein, ur 09/09/2021 NEGATIVE  NEGATIVE mg/dL Final   Nitrite 09/09/2021 NEGATIVE  NEGATIVE Final   Leukocytes,Ua 09/09/2021 NEGATIVE  NEGATIVE Final   Performed at [facility] [address] [place], [place] [address]   Specimen Description 09/09/2021 URINE, CLEAN CATCH   Final   Special Requests 09/09/2021 NONE   Final   Culture 09/09/2021  (A)   Final                   Value:<10,000 COLONIES/mL INSIGNIFICANT GROWTH Performed at [facility], [place] [address] [place], [place] [address]    Report Status 09/09/2021 09/10/2021 FINAL   Final   D-Dimer, Quant 09/09/2021 0.92 (H)  0.00 - 0.50 ug/mL-FEU  Final   Comment: (NOTE) At the manufacturer cut-off value of 0.5 g/mL FEU, this assay has a negative predictive value of 95-100%.This assay is intended for use in conjunction with a clinical pretest probability (PTP) assessment model to exclude pulmonary embolism (PE) and deep venous thrombosis (DVT) in outpatients suspected of PE or DVT. Results should be correlated with clinical presentation. Performed at [facility], [place] [address], [place], [place] [address]    Troponin I (High Sensitivity) 09/09/2021 5  <18 ng/L Final   Comment: (NOTE) Elevated high sensitivity troponin I (hsTnI) values and significant  changes across serial measurements may suggest ACS but many other  chronic and acute conditions are known to elevate hsTnI results.  Refer to the "Links" section for chest pain algorithms and additional  guidance. Performed at [facility], [place] [address], [place], [place] [address]    BP 09/10/2021 135/83  mmHg Final   S' Lateral 09/10/2021 2.30  cm Final   Area-P 1/2 09/10/2021 5.58  cm2  Final    Blood Alcohol level:  Lab Results  Component Value Date   ETH <10 11/23/2021   ETH <10 96/05/5407    Metabolic Disorder Labs: Lab Results  Component Value Date   HGBA1C 6.7 (H) 12/28/2021   MPG 145.59 12/28/2021   MPG 154.2 10/04/2021   Lab Results  Component Value Date   PROLACTIN 3.8 (L) 11/23/2021   Lab Results  Component Value Date   CHOL 171 11/23/2021   TRIG 125 11/23/2021   HDL 65 11/23/2021   CHOLHDL 2.6 11/23/2021   VLDL 25 11/23/2021   LDLCALC 81 11/23/2021   [place] 95 10/04/2021    Therapeutic Lab Levels: No results found for: "LITHIUM" Lab Results  Component Value Date   VALPROATE 33 (L) 12/28/2021   VALPROATE 54 12/03/2021   No results found for: "CBMZ"  Physical Findings   PHQ2-9    [facility] from 11/23/2021 in [facility]  PHQ-2 Total Score 2  PHQ-9 Total Score 3      Flowsheet Row  ED from 11/23/2021 in [facility] Most recent reading at 11/23/2021  6:44 PM ED from 11/22/2021 in [place] [facility] Most recent reading at 11/22/2021  8:28 PM ED from 11/22/2021 in [place] [facility] Most recent reading at 11/22/2021  1:24 AM  C-SSRS RISK CATEGORY No Risk No Risk No Risk        Musculoskeletal  Strength & Muscle Tone: within normal limits Gait & Station: normal Patient leans: N/A  Psychiatric Specialty Exam  Presentation  General Appearance:  Appropriate for Environment  Eye Contact: Fair  Speech: Clear and Coherent; Normal Rate  Speech Volume: Normal  Handedness: Right   Mood and Affect  Mood: Euthymic  Affect: Congruent   Thought Process  Thought Processes: Coherent  Descriptions of Associations:Intact  Orientation:Full (Time, Place and Person)  Thought Content:Logical  Diagnosis of Schizophrenia or Schizoaffective disorder in past: Yes  Duration of Psychotic Symptoms: No data recorded  Hallucinations:Hallucinations: None  Ideas of Reference:None  Suicidal Thoughts:Suicidal Thoughts: No  Homicidal Thoughts:Homicidal Thoughts: No   Sensorium  Memory: Immediate Fair; Recent Fair; Remote Fair  Judgment: Intact  Insight: Present   Executive Functions  Concentration: Fair  Attention Span: Fair  Recall: [employer] of Knowledge: Fair  Language: Fair   Psychomotor Activity  Psychomotor Activity: Psychomotor Activity: Normal   Assets  Assets: Communication Skills; Desire for Improvement; Financial Resources/Insurance; Physical Health; Resilience; Social Support; Leisure Time   Sleep  Sleep: Sleep: Good   No data recorded  Physical Exam  Physical Exam Vitals and nursing note reviewed.  Constitutional:      General: She is not in acute distress.    Appearance: Normal appearance. She is not ill-appearing.  HENT:     Head:  Normocephalic.  Eyes:     General:        Right eye: No discharge.        Left eye: No discharge.     Conjunctiva/sclera: Conjunctivae normal.  Cardiovascular:     Rate and Rhythm: Normal rate.  Pulmonary:     Effort: Pulmonary effort is normal. No respiratory distress.  Musculoskeletal:        General: Normal range of motion.     Cervical back: Normal range of motion.  Skin:    Coloration: Skin is not jaundiced or pale.  Neurological:     Mental Status: She is alert and oriented to person,  place, and time.  Psychiatric:        Attention and Perception: Attention and perception normal.        Mood and Affect: Mood and affect normal.        Speech: Speech normal.        Behavior: Behavior normal.        Thought Content: Thought content normal.        Cognition and Memory: Cognition normal.        Judgment: Judgment normal.    Review of Systems  Constitutional: Negative.   HENT: Negative.    Eyes: Negative.   Respiratory: Negative.    Cardiovascular: Negative.   Musculoskeletal: Negative.   Skin: Negative.   Neurological: Negative.   Psychiatric/Behavioral: Negative.     Blood pressure (!) 140/80, pulse 98, temperature 98.2 F (36.8 C), temperature source Oral, resp. rate 16, SpO2 97 %. There is no height or weight on file to calculate BMI.  Treatment Plan Summary: Disposition: We will continue to have Daily contact with patient to assess and evaluate symptoms and progress in treatment.   Social work in [place] continues to seek placement for patient.  No medication changes at this time   Revonda Humphrey, NP 01/12/2022 5:57 PM

## 2022-01-12 NOTE — ED Notes (Signed)
Pt sleeping at present, no distress noted.  Monitoring for safety. 

## 2022-01-12 NOTE — ED Notes (Signed)
Pt A&O x 4, no distress noted, calm & cooperative.  Monitoring for safety.  Watching TV at present.

## 2022-01-12 NOTE — ED Notes (Signed)
Pt eating lunch in no acute distress. Denies concerns at present. Will continue to monitor for safety.

## 2022-01-13 DIAGNOSIS — F209 Schizophrenia, unspecified: Secondary | ICD-10-CM | POA: Diagnosis not present

## 2022-01-13 LAB — GLUCOSE, CAPILLARY
Glucose-Capillary: 148 mg/dL — ABNORMAL HIGH (ref 70–99)
Glucose-Capillary: 69 mg/dL — ABNORMAL LOW (ref 70–99)
Glucose-Capillary: 151 mg/dL — ABNORMAL HIGH (ref 70–99)

## 2022-01-13 NOTE — ED Notes (Signed)
Pt sleeping at present, no distress noted.  Monitoring for safety. 

## 2022-01-13 NOTE — ED Notes (Signed)
Pt is in the bed resting. Respirations are even and unlabored. No acute distress noted. Will continue to monitor for safety 

## 2022-01-13 NOTE — Inpatient Diabetes Management (Signed)
Inpatient Diabetes Program Recommendations  AACE/ADA: New Consensus Statement on Inpatient Glycemic Control (2015)  Target Ranges:  Prepandial:   less than 140 mg/dL      Peak postprandial:   less than 180 mg/dL (1-2 hours)      Critically ill patients:  140 - 180 mg/dL   Lab Results  Component Value Date   GLUCAP 69 (L) 01/13/2022   HGBA1C 6.7 (H) 12/28/2021    Review of Glycemic Control  Latest Reference Range & Units 01/12/22 07:44 01/12/22 12:02 01/12/22 17:39 01/12/22 21:52 01/13/22 08:03 01/13/22 11:38  Glucose-Capillary 70 - 99 mg/dL 025 (H) 427 (H) 062 (H) 152 (H) 148 (H) 69 (L)  (H): Data is abnormally high (L): Data is abnormally low  Diabetes history: DM Outpatient Diabetes medications: Lantus 12 units QD, Novolog 2-10 units TID and Metformni 500 mg BID Current orders for Inpatient glycemic control: Semglee 10 units QD, Novolog 0-9 units TID, Metformin 1000 mg BID  Inpatient Diabetes Program Recommendations:    Mild low of 69 mg/dL this afternoon at lunch.  Might consider:  Semglee 8 units QD.   Will continue to follow while inpatient.  Thank you, Dulce Sellar, MSN, CDCES Diabetes Coordinator Inpatient Diabetes Program [phone] (team pager from 8a-5p)

## 2022-01-13 NOTE — ED Provider Notes (Signed)
Behavioral Health Progress Note  Date and Time: 01/13/2022 9:22 AM Name: Teresa Murillo MRN:  [id number]  Subjective:   Teresa Murillo is a 61 y.o. female with a past psychiatric history of schizophrenia, aggressive behavior, and possible intellectual disability presenting to [facility] on 11/23/21 voluntarily as a walk in via [facility] with complaints that she was locked out of the group home. Patient has been dismissed from group home due to multiple elopements. Pt had already been discharged from this facility but had been living there temporarily while DSS was looking for new placement. Pt is boarding at [facility].   AID to Capacity Evaluation (ACE) completed by Dr. Louis Meckel, Dr. Dwyane Dee on 12/11/2021.  Please see media tab for full details.    [name], PhD completed: Wechsler Adult Intelligence Scale-4, [name] achieved a full-scale IQ score of 73 and a percentile rank of 4 placing her in the borderline range of intellectual functioning (12/14/2021) Please see consult note from [name], PhD on 12/14/2021  On reassessment, pt denies SI/VI/HI, AVH, paranoia. She denies any physical complaints. Pt is a&ox3, in no acute distress, non toxic appearing. She is calm, cooperative, pleasant. There is no evidence she is responding to internal stimuli. There is no evidence of agitation, aggression or distractibility. Pt remains psychiatrically cleared. Disposition is pending placement.   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 On the Wechsler Adult Intelligence Scale-4, Ms. Spratlin achieved a full-scale IQ  score of 73 and a percentile rank of 4 placing her in the borderline range of intellectual functioning.    Chronic obstructive pulmonary disease (COPD) (HCC)    Glaucoma    Hyperlipidemia    Hypertension    Iron deficiency    Schizoaffective disorder (HCC)    Type 2 diabetes mellitus (HCC)     Past Surgical History:  Procedure Laterality Date   TUBAL LIGATION     Family History:  Family History  Problem Relation Age of Onset   Breast cancer Maternal Grandmother    Family Psychiatric  History: None reported Social History:  Social History   Substance and Sexual Activity  Alcohol Use Yes     Social History   Substance and Sexual Activity  Drug Use Not Currently    Social History   Socioeconomic History   Marital status: Divorced    Spouse name: Not on file   Number of children: Not on file   Years of education: Not on file   Highest education level: Not on file  Occupational History   Not on file  Tobacco Use   Smoking status: Every Day    Packs/day: 1.00    Types: Cigars, Cigarettes   Smokeless tobacco: Current  Vaping Use   Vaping Use: Never used  Substance and Sexual Activity   Alcohol use: Yes   Drug use: Not Currently   Sexual activity: Not Currently    Birth control/protection: Surgical  Other Topics Concern   Not on file  Social History Narrative   Not on file   Social Determinants of Health   Financial Resource Strain: Not on file  Food Insecurity: Not on file  Transportation Needs: Not on  file  Physical Activity: Not on file  Stress: Not on file  Social Connections: Not on file   SDOH:  SDOH Screenings   Depression (PHQ2-9): Low Risk  (11/23/2021)  Tobacco Use: High Risk (12/30/2021)   Additional Social History:    Pain Medications: See MAR Prescriptions: See MAR Over the Counter: See MAR History of alcohol / drug use?: No history of alcohol / drug abuse Longest period of sobriety (when/how long): N/A                     Sleep: Good  Appetite:  Good  Current Medications:  Current Facility-Administered Medications  Medication Dose Route Frequency Provider Last Rate Last Admin   acetaminophen (TYLENOL) tablet 650 mg  650 mg Oral Q6H PRN Rankin, Shuvon B, NP   650 mg at 01/12/22 2239   albuterol (VENTOLIN HFA) 108 (90 Base) MCG/ACT inhaler 2 puff  2 puff Inhalation Q6H PRN Rankin, Shuvon B, NP   2 puff at 12/09/21 2102   alum & mag hydroxide-simeth (MAALOX/MYLANTA) 200-200-20 MG/5ML suspension 30 mL  30 mL Oral Q4H PRN Rankin, Shuvon B, NP   30 mL at 12/19/21 2109   apixaban (ELIQUIS) tablet 5 mg  5 mg Oral BID Rankin, Shuvon B, NP   5 mg at 01/12/22 2227   cloZAPine (CLOZARIL) tablet 50 mg  50 mg Oral Daily [name], NP   50 mg at 01/12/22 0946   cloZAPine (CLOZARIL) tablet 75 mg  75 mg Oral Dorie Rank, NP   75 mg at 01/12/22 2227   diltiazem (CARDIZEM CD) 24 hr capsule 240 mg  240 mg Oral Daily Rankin, Shuvon B, NP   240 mg at 01/12/22 0950   divalproex (DEPAKOTE ER) 24 hr tablet 500 mg  500 mg Oral BID Rankin, Shuvon B, NP   500 mg at 01/12/22 2227   fluticasone furoate-vilanterol (BREO ELLIPTA) 200-25 MCG/ACT 1 puff  1 puff Inhalation Daily Rankin, Shuvon B, NP   1 puff at 01/13/22 0823   haloperidol (HALDOL) tablet 10 mg  10 mg Oral Q8H PRN Rankin, Shuvon B, NP       insulin aspart (novoLOG) injection 0-9 Units  0-9 Units Subcutaneous TID WC Tharon Aquas, NP   1 Units at 01/13/22 0823   insulin glargine-yfgn (SEMGLEE) injection 10 Units  10 Units Subcutaneous Daily [name], DO   10 Units at 01/12/22 0952   latanoprost (XALATAN) 0.005 % ophthalmic solution 1 drop  1 drop Both Eyes QHS Rankin, Shuvon B, NP   1 drop at 01/12/22 2229   magnesium hydroxide (MILK OF MAGNESIA) suspension 30 mL  30 mL Oral Daily PRN Rankin, Shuvon B, NP       menthol-cetylpyridinium (CEPACOL) lozenge 3 mg  1 lozenge Oral Once [name], NP       menthol-cetylpyridinium (CEPACOL) lozenge 3 mg  1  lozenge Oral PRN [name], NP   3 mg at 01/12/22 2239   metFORMIN (GLUCOPHAGE-XR) 24 hr tablet 1,000 mg  1,000 mg Oral BID WC [name], DO   1,000 mg at 01/13/22 9371   multivitamins with iron tablet 1 tablet  1 tablet Oral Daily [name], DO   1 tablet at 01/12/22 0946   nicotine (NICODERM CQ - dosed in mg/24 hr) patch 7 mg  7 mg Transdermal Daily Rankin, Shuvon B, NP   7 mg at 01/12/22 0948   polyethylene glycol (MIRALAX / GLYCOLAX) packet 17 g  17 g  Oral Daily [name], DO   17 g at 01/12/22 2703   Vitamin D (Ergocalciferol) (DRISDOL) 1.25 MG (50000 UNIT) capsule 50,000 Units  50,000 Units Oral Q7 days Rankin, Shuvon B, NP   50,000 Units at 01/07/22 5009   Current Outpatient Medications  Medication Sig Dispense Refill   Accu-Chek Softclix Lancets lancets Use as directed up to four times daily 100 each 0   apixaban (ELIQUIS) 5 MG TABS tablet Take 1 tablet (5 mg total) by mouth 2 (two) times daily. 60 tablet 0   ARIPiprazole (ABILIFY) 10 MG tablet Take 1 tablet (10 mg total) by mouth daily. 30 tablet 0   Blood Glucose Monitoring Suppl (ACCU-CHEK GUIDE) w/Device KIT Use as directed up to four times daily 1 kit 0   budesonide-formoterol (SYMBICORT) 160-4.5 MCG/ACT inhaler Inhale 2 puffs into the lungs in the morning and at bedtime.     Cholecalciferol (VITAMIN D3) 1.25 MG (50000 UT) CAPS Take 50,000 Units by mouth every Thursday.     cloZAPine (CLOZARIL) 25 MG tablet Take 3 tablets (75 mg total) by mouth at bedtime. 90 tablet 0   clozapine (CLOZARIL) 50 MG tablet Take 1 tablet (50 mg total) by mouth daily. 30 tablet 0   diltiazem (CARDIZEM CD) 240 MG 24 hr capsule Take 1 capsule (240 mg total) by mouth daily. (Patient not taking: Reported on 11/24/2021) 30 capsule 0   divalproex (DEPAKOTE ER) 500 MG 24 hr tablet Take 1 tablet (500 mg total) by mouth 2 (two) times daily. 60 tablet 0   glucose blood test strip Use as directed up to four times daily 50 each 0   haloperidol (HALDOL)  10 MG tablet Take 1 tablet (10 mg total) by mouth 3 (three) times daily as needed for agitation (and psychotic symptoms).     INGREZZA 40 MG capsule Take 1 capsule (40 mg total) by mouth in the morning. 30 capsule 0   insulin aspart (NOVOLOG) 100 UNIT/ML FlexPen Before each meal 3 times a day, 140-199 - 2 units, 200-250 - 4 units, 251-299 - 6 units,  300-349 - 8 units,  350 or above 10 units. Insulin PEN if approved, provide syringes and needles if needed.Please switch to any approved short acting Insulin if needed. 15 mL 0   insulin glargine (LANTUS) 100 UNIT/ML Solostar Pen Inject 12 Units into the skin daily. 15 mL 0   Insulin Pen Needle 32G X 4 MM MISC Use 4 times a day with insulin, 1 month supply. 100 each 0   latanoprost (XALATAN) 0.005 % ophthalmic solution Place 1 drop into both eyes at bedtime.     metFORMIN (GLUCOPHAGE) 500 MG tablet Take 500 mg by mouth 2 (two) times daily with a meal.     nicotine (NICODERM CQ - DOSED IN MG/24 HR) 7 mg/24hr patch Place 1 patch (7 mg total) onto the skin daily. 28 patch 0   PROAIR HFA 108 (90 Base) MCG/ACT inhaler Inhale 2 puffs into the lungs every 6 (six) hours as needed for wheezing or shortness of breath.      Labs  Lab Results:  No results displayed because visit has over 200 results.    Admission on 11/22/2021, Discharged on 11/22/2021  Component Date Value Ref Range Status   Glucose-Capillary 11/22/2021 169 (H)  70 - 99 mg/dL Final   Glucose reference range applies only to samples taken after fasting for at least 8 hours.  No results displayed because visit has over 200 results.  Admission on 10/04/2021, Discharged on 10/22/2021  Component Date Value Ref Range Status   SARS Coronavirus 2 by RT PCR 10/04/2021 NEGATIVE  NEGATIVE Final   Comment: (NOTE) SARS-CoV-2 target nucleic acids are NOT DETECTED.  The SARS-CoV-2 RNA is generally detectable in upper respiratory specimens during the acute phase of infection. The  lowest concentration of SARS-CoV-2 viral copies this assay can detect is 138 copies/mL. A negative result does not preclude SARS-Cov-2 infection and should not be used as the sole basis for treatment or other patient management decisions. A negative result may occur with  improper specimen collection/handling, submission of specimen other than nasopharyngeal swab, presence of viral mutation(s) within the areas targeted by this assay, and inadequate number of viral copies(<138 copies/mL). A negative result must be combined with clinical observations, patient history, and epidemiological information. The expected result is Negative.  Fact Sheet for Patients:  EntrepreneurPulse.com.au  Fact Sheet for Healthcare Providers:  IncredibleEmployment.be  This test is no                          t yet approved or cleared by the [place] FDA and  has been authorized for detection and/or diagnosis of SARS-CoV-2 by FDA under an Emergency Use Authorization (EUA). This EUA will remain  in effect (meaning this test can be used) for the duration of the COVID-19 declaration under Section 564(b)(1) of the Act, 21 U.S.C.section 360bbb-3(b)(1), unless the authorization is terminated  or revoked sooner.       Influenza A by PCR 10/04/2021 NEGATIVE  NEGATIVE Final   Influenza B by PCR 10/04/2021 NEGATIVE  NEGATIVE Final   Comment: (NOTE) The Xpert Xpress SARS-CoV-2/FLU/RSV plus assay is intended as an aid in the diagnosis of influenza from Nasopharyngeal swab specimens and should not be used as a sole basis for treatment. Nasal washings and aspirates are unacceptable for Xpert Xpress SARS-CoV-2/FLU/RSV testing.  Fact Sheet for Patients: EntrepreneurPulse.com.au  Fact Sheet for Healthcare Providers: IncredibleEmployment.be  This test is not yet approved or cleared by the [place] FDA and has been authorized for  detection and/or diagnosis of SARS-CoV-2 by FDA under an Emergency Use Authorization (EUA). This EUA will remain in effect (meaning this test can be used) for the duration of the COVID-19 declaration under Section 564(b)(1) of the Act, 21 U.S.C. section 360bbb-3(b)(1), unless the authorization is terminated or revoked.  Performed at [facility], [place] [address] [place], [place] [address]    WBC 10/04/2021 8.4  4.0 - 10.5 K/uL Final   RBC 10/04/2021 4.42  3.87 - 5.11 MIL/uL Final   Hemoglobin 10/04/2021 11.6 (L)  12.0 - 15.0 g/dL Final   HCT 10/04/2021 35.7 (L)  36.0 - 46.0 % Final   MCV 10/04/2021 80.8  80.0 - 100.0 fL Final   MCH 10/04/2021 26.2  26.0 - 34.0 pg Final   MCHC 10/04/2021 32.5  30.0 - 36.0 g/dL Final   RDW 10/04/2021 16.0 (H)  11.5 - 15.5 % Final   Platelets 10/04/2021 372  150 - 400 K/uL Final   nRBC 10/04/2021 0.0  0.0 - 0.2 % Final   Neutrophils Relative % 10/04/2021 68  % Final   Neutro Abs 10/04/2021 5.7  1.7 - 7.7 K/uL Final   Lymphocytes Relative 10/04/2021 22  % Final   Lymphs Abs 10/04/2021 1.8  0.7 - 4.0 K/uL Final   Monocytes Relative 10/04/2021 8  % Final   Monocytes Absolute 10/04/2021 0.7  0.1 - 1.0 K/uL Final   Eosinophils Relative 10/04/2021 1  % Final   Eosinophils Absolute 10/04/2021 0.1  0.0 - 0.5 K/uL Final   Basophils Relative 10/04/2021 1  % Final   Basophils Absolute 10/04/2021 0.1  0.0 - 0.1 K/uL Final   Immature Granulocytes 10/04/2021 0  % Final   Abs Immature Granulocytes 10/04/2021 0.03  0.00 - 0.07 K/uL Final   Performed at [facility], [place] [address] [place], [place] [address]   Sodium 10/04/2021 136  135 - 145 mmol/L Final   Potassium 10/04/2021 4.2  3.5 - 5.1 mmol/L Final   Chloride 10/04/2021 104  98 - 111 mmol/L Final   CO2 10/04/2021 25  22 - 32 mmol/L Final   Glucose, Bld 10/04/2021 100 (H)  70 - 99 mg/dL Final   Glucose reference range applies only to samples taken after fasting for at least 8 hours.   BUN  10/04/2021 9  6 - 20 mg/dL Final   Creatinine, Ser 10/04/2021 0.52  0.44 - 1.00 mg/dL Final   Calcium 10/04/2021 9.0  8.9 - 10.3 mg/dL Final   Total Protein 10/04/2021 7.0  6.5 - 8.1 g/dL Final   Albumin 10/04/2021 3.2 (L)  3.5 - 5.0 g/dL Final   AST 10/04/2021 13 (L)  15 - 41 U/L Final   ALT 10/04/2021 10  0 - 44 U/L Final   Alkaline Phosphatase 10/04/2021 61  38 - 126 U/L Final   Total Bilirubin 10/04/2021 0.3  0.3 - 1.2 mg/dL Final   GFR, Estimated 10/04/2021 >60  >60 mL/min Final   Comment: (NOTE) Calculated using the CKD-EPI Creatinine Equation (2021)    Anion gap 10/04/2021 7  5 - 15 Final   Performed at [facility] [address], [place], [place] [address]   Hgb A1c MFr Bld 10/04/2021 7.0 (H)  4.8 - 5.6 % Final   Comment: (NOTE) Pre diabetes:          5.7%-6.4%  Diabetes:              >6.4%  Glycemic control for   <7.0% adults with diabetes    Mean Plasma Glucose 10/04/2021 154.2  mg/dL Final   Performed at [facility], [place] [address], [place], [place] [address]   Cholesterol 10/04/2021 178  0 - 200 mg/dL Final   Triglycerides 10/04/2021 155 (H)  <150 mg/dL Final   HDL 10/04/2021 52  >40 mg/dL Final   Total CHOL/HDL Ratio 10/04/2021 3.4  RATIO Final   VLDL 10/04/2021 31  0 - 40 mg/dL Final   LDL Cholesterol 10/04/2021 95  0 - 99 mg/dL Final   Comment:        Total Cholesterol/HDL:CHD Risk Coronary Heart Disease Risk Table                     Men   Women  1/2 Average Risk   3.4   3.3  Average Risk       5.0   4.4  2 X Average Risk   9.6   7.1  3 X Average Risk  23.4   11.0        Use the calculated Patient Ratio above and the CHD Risk Table to determine the patient's CHD Risk.        ATP III CLASSIFICATION (LDL):  <100     mg/dL   Optimal  100-129  mg/dL   Near or Above  Optimal  130-159  mg/dL   Borderline  160-189  mg/dL   High  >190     mg/dL   Very High Performed at [facility] [address], [place], [place]  [address]    POC Amphetamine UR 10/04/2021 None Detected  NONE DETECTED (Cut Off Level 1000 ng/mL) Final   POC Secobarbital (BAR) 10/04/2021 None Detected  NONE DETECTED (Cut Off Level 300 ng/mL) Final   POC Buprenorphine (BUP) 10/04/2021 None Detected  NONE DETECTED (Cut Off Level 10 ng/mL) Final   POC Oxazepam (BZO) 10/04/2021 None Detected  NONE DETECTED (Cut Off Level 300 ng/mL) Final   POC Cocaine UR 10/04/2021 None Detected  NONE DETECTED (Cut Off Level 300 ng/mL) Final   POC Methamphetamine UR 10/04/2021 None Detected  NONE DETECTED (Cut Off Level 1000 ng/mL) Final   POC Morphine 10/04/2021 None Detected  NONE DETECTED (Cut Off Level 300 ng/mL) Final   POC Methadone UR 10/04/2021 None Detected  NONE DETECTED (Cut Off Level 300 ng/mL) Final   POC Oxycodone UR 10/04/2021 Positive (A)  NONE DETECTED (Cut Off Level 100 ng/mL) Final   POC Marijuana UR 10/04/2021 None Detected  NONE DETECTED (Cut Off Level 50 ng/mL) Final   SARSCOV2ONAVIRUS 2 AG 10/04/2021 NEGATIVE  NEGATIVE Final   Comment: (NOTE) SARS-CoV-2 antigen NOT DETECTED.   Negative results are presumptive.  Negative results do not preclude SARS-CoV-2 infection and should not be used as the sole basis for treatment or other patient management decisions, including infection  control decisions, particularly in the presence of clinical signs and  symptoms consistent with COVID-19, or in those who have been in contact with the virus.  Negative results must be combined with clinical observations, patient history, and epidemiological information. The expected result is Negative.  Fact Sheet for Patients: HandmadeRecipes.com.cy  Fact Sheet for Healthcare Providers: FuneralLife.at  This test is not yet approved or cleared by the [place] FDA and  has been authorized for detection and/or diagnosis of SARS-CoV-2 by FDA under an Emergency Use Authorization (EUA).  This EUA will remain in  effect (meaning this test can be used) for the duration of  the COV                          ID-19 declaration under Section 564(b)(1) of the Act, 21 U.S.C. section 360bbb-3(b)(1), unless the authorization is terminated or revoked sooner.     Valproic Acid Lvl 10/04/2021 51  50.0 - 100.0 ug/mL Final   Performed at [facility] [address] [place], [place] [address]   Valproic Acid Lvl 10/08/2021 57  50.0 - 100.0 ug/mL Final   Performed at [facility] [address], [place], [place] [address]   Glucose-Capillary 10/09/2021 123 (H)  70 - 99 mg/dL Final   Glucose reference range applies only to samples taken after fasting for at least 8 hours.  Admission on 09/09/2021, Discharged on 09/10/2021  Component Date Value Ref Range Status   Sodium 09/09/2021 134 (L)  135 - 145 mmol/L Final   Potassium 09/09/2021 4.3  3.5 - 5.1 mmol/L Final   Chloride 09/09/2021 99  98 - 111 mmol/L Final   CO2 09/09/2021 25  22 - 32 mmol/L Final   Glucose, Bld 09/09/2021 107 (H)  70 - 99 mg/dL Final   Glucose reference range applies only to samples taken after fasting for at least 8 hours.   BUN 09/09/2021 12  6 - 20 mg/dL Final   Creatinine, Ser 09/09/2021 0.74  0.44 - 1.00 mg/dL Final   Calcium 09/09/2021 9.0  8.9 - 10.3 mg/dL Final   Total Protein 09/09/2021 7.4  6.5 - 8.1 g/dL Final   Albumin 09/09/2021 3.1 (L)  3.5 - 5.0 g/dL Final   AST 09/09/2021 13 (L)  15 - 41 U/L Final   ALT 09/09/2021 13  0 - 44 U/L Final   Alkaline Phosphatase 09/09/2021 66  38 - 126 U/L Final   Total Bilirubin 09/09/2021 0.2 (L)  0.3 - 1.2 mg/dL Final   GFR, Estimated 09/09/2021 >60  >60 mL/min Final   Comment: (NOTE) Calculated using the CKD-EPI Creatinine Equation (2021)    Anion gap 09/09/2021 10  5 - 15 Final   Performed at [facility], [place] [address], [place], [place] [address]   WBC 09/09/2021 8.5  4.0 - 10.5 K/uL Final   RBC 09/09/2021 4.53  3.87 - 5.11 MIL/uL Final   Hemoglobin 09/09/2021  11.8 (L)  12.0 - 15.0 g/dL Final   HCT 09/09/2021 38.0  36.0 - 46.0 % Final   MCV 09/09/2021 83.9  80.0 - 100.0 fL Final   MCH 09/09/2021 26.0  26.0 - 34.0 pg Final   MCHC 09/09/2021 31.1  30.0 - 36.0 g/dL Final   RDW 09/09/2021 17.8 (H)  11.5 - 15.5 % Final   Platelets 09/09/2021 442 (H)  150 - 400 K/uL Final   nRBC 09/09/2021 0.0  0.0 - 0.2 % Final   Neutrophils Relative % 09/09/2021 70  % Final   Neutro Abs 09/09/2021 5.9  1.7 - 7.7 K/uL Final   Lymphocytes Relative 09/09/2021 20  % Final   Lymphs Abs 09/09/2021 1.7  0.7 - 4.0 K/uL Final   Monocytes Relative 09/09/2021 8  % Final   Monocytes Absolute 09/09/2021 0.7  0.1 - 1.0 K/uL Final   Eosinophils Relative 09/09/2021 1  % Final   Eosinophils Absolute 09/09/2021 0.1  0.0 - 0.5 K/uL Final   Basophils Relative 09/09/2021 1  % Final   Basophils Absolute 09/09/2021 0.1  0.0 - 0.1 K/uL Final   Immature Granulocytes 09/09/2021 0  % Final   Abs Immature Granulocytes 09/09/2021 0.03  0.00 - 0.07 K/uL Final   Performed at [facility], [place] [address], [place], [place] [address]   Ammonia 09/09/2021 37 (H)  9 - 35 umol/L Final   Comment: HEMOLYSIS AT THIS LEVEL MAY AFFECT RESULT Performed at [facility], [place] [address], [place], [place] [address]    Opiates 09/09/2021 NONE DETECTED  NONE DETECTED Final   Cocaine 09/09/2021 NONE DETECTED  NONE DETECTED Final   Benzodiazepines 09/09/2021 NONE DETECTED  NONE DETECTED Final   Amphetamines 09/09/2021 NONE DETECTED  NONE DETECTED Final   Tetrahydrocannabinol 09/09/2021 NONE DETECTED  NONE DETECTED Final   Barbiturates 09/09/2021 NONE DETECTED  NONE DETECTED Final   Comment: (NOTE) DRUG SCREEN FOR MEDICAL PURPOSES ONLY.  IF CONFIRMATION IS NEEDED FOR ANY PURPOSE, NOTIFY LAB WITHIN 5 DAYS.  LOWEST DETECTABLE LIMITS FOR URINE DRUG SCREEN Drug Class                     Cutoff (ng/mL) Amphetamine and metabolites    1000 Barbiturate and metabolites    200 Benzodiazepine                  106 Tricyclics and metabolites     300 Opiates and metabolites  300 Cocaine and metabolites        300 THC                            50 Performed at [facility], [place] [address] [place], [place] [address]    Alcohol, Ethyl (B) 09/09/2021 <10  <10 mg/dL Final   Comment: (NOTE) Lowest detectable limit for serum alcohol is 10 mg/dL.  For medical purposes only. Performed at [facility], [place] [address], [place], [place] [address]    Lipase 09/09/2021 33  11 - 51 U/L Final   Performed at [facility] [place] [address], [place], [place] [address]   Color, Urine 09/09/2021 COLORLESS (A)  YELLOW Final   APPearance 09/09/2021 CLEAR  CLEAR Final   Specific Gravity, Urine 09/09/2021 1.002 (L)  1.005 - 1.030 Final   pH 09/09/2021 6.0  5.0 - 8.0 Final   Glucose, UA 09/09/2021 NEGATIVE  NEGATIVE mg/dL Final   Hgb urine dipstick 09/09/2021 NEGATIVE  NEGATIVE Final   Bilirubin Urine 09/09/2021 NEGATIVE  NEGATIVE Final   Ketones, ur 09/09/2021 NEGATIVE  NEGATIVE mg/dL Final   Protein, ur 09/09/2021 NEGATIVE  NEGATIVE mg/dL Final   Nitrite 09/09/2021 NEGATIVE  NEGATIVE Final   Leukocytes,Ua 09/09/2021 NEGATIVE  NEGATIVE Final   Performed at [facility] [address] [place], [place] [address]   Specimen Description 09/09/2021 URINE, CLEAN CATCH   Final   Special Requests 09/09/2021 NONE   Final   Culture 09/09/2021  (A)   Final                   Value:<10,000 COLONIES/mL INSIGNIFICANT GROWTH Performed at [facility], [place] [address], [place], [place] [address]    Report Status 09/09/2021 09/10/2021 FINAL   Final   D-Dimer, Quant 09/09/2021 0.92 (H)  0.00 - 0.50 ug/mL-FEU Final   Comment: (NOTE) At the manufacturer cut-off value of 0.5 g/mL FEU, this assay has a negative predictive value of 95-100%.This assay is intended for use in conjunction with a clinical pretest probability (PTP) assessment model to exclude pulmonary embolism (PE) and deep  venous thrombosis (DVT) in outpatients suspected of PE or DVT. Results should be correlated with clinical presentation. Performed at [facility], [place] [address], [place], [place] [address]    Troponin I (High Sensitivity) 09/09/2021 5  <18 ng/L Final   Comment: (NOTE) Elevated high sensitivity troponin I (hsTnI) values and significant  changes across serial measurements may suggest ACS but many other  chronic and acute conditions are known to elevate hsTnI results.  Refer to the "Links" section for chest pain algorithms and additional  guidance. Performed at [facility], [place] [address] [place], [place] [address]    BP 09/10/2021 135/83  mmHg Final   S' Lateral 09/10/2021 2.30  cm Final   Area-P 1/2 09/10/2021 5.58  cm2 Final    Blood Alcohol level:  Lab Results  Component Value Date   ETH <10 11/23/2021   ETH <10 09/64/3838    Metabolic Disorder Labs: Lab Results  Component Value Date   HGBA1C 6.7 (H) 12/28/2021   MPG 145.59 12/28/2021   MPG 154.2 10/04/2021   Lab Results  Component Value Date   PROLACTIN 3.8 (L) 11/23/2021   Lab Results  Component Value Date   CHOL 171 11/23/2021   TRIG 125 11/23/2021   HDL 65 11/23/2021  CHOLHDL 2.6 11/23/2021   VLDL 25 11/23/2021   LDLCALC 81 11/23/2021   LDLCALC 95 10/04/2021    Therapeutic Lab Levels: No results found for: "LITHIUM" Lab Results  Component Value Date   VALPROATE 33 (L) 12/28/2021   VALPROATE 54 12/03/2021   No results found for: "CBMZ"  Physical Findings   PHQ2-9    [place] ED from 11/23/2021 in [facility]  PHQ-2 Total Score 2  PHQ-9 Total Score 3      Flowsheet Row ED from 11/23/2021 in [facility] Most recent reading at 11/23/2021  6:44 PM ED from 11/22/2021 in [place] DEPT Most recent reading at 11/22/2021  8:28 PM ED from 11/22/2021 in [place]  DEPT Most recent reading at 11/22/2021  1:24 AM  C-SSRS RISK CATEGORY No Risk No Risk No Risk        Musculoskeletal  Strength & Muscle Tone: within normal limits Gait & Station: normal Patient leans: N/A  Psychiatric Specialty Exam  Presentation  General Appearance:  Appropriate for Environment; Casual  Eye Contact: Good  Speech: Clear and Coherent; Normal Rate  Speech Volume: Normal  Handedness: Right   Mood and Affect  Mood: Euthymic  Affect: Flat   Thought Process  Thought Processes: Coherent  Descriptions of Associations:Intact  Orientation:Full (Time, Place and Person)  Thought Content:Logical  Diagnosis of Schizophrenia or Schizoaffective disorder in past: Yes  Duration of Psychotic Symptoms: No data recorded  Hallucinations:Hallucinations: None  Ideas of Reference:None  Suicidal Thoughts:Suicidal Thoughts: No  Homicidal Thoughts:Homicidal Thoughts: No   Sensorium  Memory: Immediate Fair  Judgment: Intact  Insight: Present   Executive Functions  Concentration: Fair  Attention Span: Fair  Recall: [employer] of Knowledge: Fair  Language: Fair   Psychomotor Activity  Psychomotor Activity: Psychomotor Activity: Normal   Assets  Assets: Communication Skills; Desire for Improvement; Financial Resources/Insurance   Sleep  Sleep: Sleep: Good   No data recorded  Physical Exam  Physical Exam Constitutional:      General: She is not in acute distress.    Appearance: She is not ill-appearing, toxic-appearing or diaphoretic.  Eyes:     General: No scleral icterus. Cardiovascular:     Rate and Rhythm: Tachycardia present.  Pulmonary:     Effort: Pulmonary effort is normal. No respiratory distress.  Neurological:     Mental Status: She is alert and oriented to person, place, and time.  Psychiatric:        Attention and Perception: Attention and perception normal.        Mood and Affect: Mood normal. Affect is  flat.        Speech: Speech normal.        Behavior: Behavior normal. Behavior is cooperative.        Thought Content: Thought content normal.    Review of Systems  Constitutional:  Negative for chills and fever.  Respiratory:  Negative for shortness of breath.   Cardiovascular:  Negative for chest pain and palpitations.  Gastrointestinal:  Negative for abdominal pain.  Neurological:  Negative for headaches.   Blood pressure 130/88, pulse (!) 109, temperature 97.8 F (36.6 C), temperature source Oral, resp. rate 18, SpO2 98 %. There is no height or weight on file to calculate BMI.  Treatment Plan Summary: Plan Pt is psychiatrically cleared. Disposition is pending placement.  Tharon Aquas, NP 01/13/2022 9:22 AM

## 2022-01-13 NOTE — Progress Notes (Signed)
LCSW Progress Note   LCSW received a phone call from Ephraim Hamburger from [facility], [phone], who will be coming on 14 January 2022 @ 1300 to meet with the pt.  An email was sent to Denton Lank, care manager at [facility], to inform her and provide his contact information.  Hansel Starling, MSW, LCSW [facility] [phone] phone [phone] fax

## 2022-01-14 DIAGNOSIS — F209 Schizophrenia, unspecified: Secondary | ICD-10-CM | POA: Diagnosis not present

## 2022-01-14 LAB — GLUCOSE, CAPILLARY
Glucose-Capillary: 113 mg/dL — ABNORMAL HIGH (ref 70–99)
Glucose-Capillary: 100 mg/dL — ABNORMAL HIGH (ref 70–99)
Glucose-Capillary: 143 mg/dL — ABNORMAL HIGH (ref 70–99)
Glucose-Capillary: 139 mg/dL — ABNORMAL HIGH (ref 70–99)

## 2022-01-14 MED ORDER — INSULIN GLARGINE-YFGN 100 UNIT/ML ~~LOC~~ SOLN
8.0000 [IU] | Freq: Every day | SUBCUTANEOUS | Status: DC
  Administered 2022-01-14 – 2022-01-23 (×10): 8 [IU] via SUBCUTANEOUS

## 2022-01-14 NOTE — ED Notes (Signed)
Pt is sleeping. No distress noted. Will continue to monitor safety. 

## 2022-01-14 NOTE — ED Notes (Signed)
Pt is in the bed sleeping. Respirations are even and unlabored. No acute distress noted. Will continue to monitor for safety. 

## 2022-01-14 NOTE — ED Provider Notes (Addendum)
Behavioral Health Progress Note  Date and Time: 01/14/2022 11:22 AM Name: Teresa Murillo MRN:  [id number]  Subjective:   Teresa Murillo is a 61 y.o. female with a past psychiatric history of schizophrenia, aggressive behavior, and possible intellectual disability presenting to [facility] on 11/23/21 voluntarily as a walk in via [facility] with complaints that she was locked out of the group home. Patient has been dismissed from group home due to multiple elopements. Pt had already been discharged from this facility but had been living there temporarily while DSS was looking for new placement. Pt is boarding at [facility].   AID to Capacity Evaluation (ACE) completed by Dr. Louis Meckel, Dr. Dwyane Dee on 12/11/2021.  Please see media tab for full details.    [name], PhD completed: Wechsler Adult Intelligence Scale-4, Teresa Murillo achieved a full-scale IQ score of 73 and a percentile rank of 4 placing her in the borderline range of intellectual functioning (12/14/2021) Please see consult note from [name], PhD on 12/14/2021.  On reassessment, pt is sitting up on her reclining chair/bed eating breakfast. She denies SI/VI/HI, AVH paranoia. She denies any physical complaints. Pt is a&ox3, in no acute distress, non toxic appearing. She is calm, cooperative, pleasant. There is no evidence she is responding to internal stimuli. There is no evidence of agitation, aggression or distractibility. Pt remains psychiatrically cleared. Disposition is pending placement.  Per inpatient diabetes program recommendations, recommend semglee 8units QD. Orders changed to reflect recommendation.  Diagnosis:  Final diagnoses:  At risk for self care deficit  Noncompliance  Self-care deficit for medication administration  Schizophrenia, unspecified type ([place])    Total Time spent with patient: 15 minutes  Past Psychiatric History: see H&P Past Medical History:  Past Medical History:  Diagnosis Date   Borderline  intellectual functioning 12/14/2021   On 12/14/2021: Appreciate assistance from psychology consult. On the Wechsler Adult Intelligence Scale-4, Teresa Murillo achieved a full-scale IQ score of 73 and a percentile rank of 4 placing her in the borderline range of intellectual functioning.    Chronic obstructive pulmonary disease (COPD) (HCC)    Glaucoma    Hyperlipidemia    Hypertension    Iron deficiency    Schizoaffective disorder (HCC)    Type 2 diabetes mellitus (HCC)     Past Surgical History:  Procedure Laterality Date   TUBAL LIGATION     Family History:  Family History  Problem Relation Age of Onset   Breast cancer Maternal Grandmother    Family Psychiatric  History: None reported Social History:  Social History   Substance and Sexual Activity  Alcohol Use Yes     Social History   Substance and Sexual Activity  Drug Use Not Currently    Social History   Socioeconomic History   Marital status: Divorced    Spouse name: Not on file   Number of children: Not on file   Years of education: Not on file   Highest education level: Not on file  Occupational History   Not on file  Tobacco Use   Smoking status: Every Day    Packs/day: 1.00    Types: Cigars, Cigarettes   Smokeless tobacco: Current  Vaping Use   Vaping Use: Never used  Substance and Sexual Activity   Alcohol use: Yes   Drug use: Not Currently   Sexual activity: Not Currently    Birth control/protection: Surgical  Other Topics Concern   Not on file  Social History Narrative   Not on file  Social Determinants of Health   Financial Resource Strain: Not on file  Food Insecurity: Not on file  Transportation Needs: Not on file  Physical Activity: Not on file  Stress: Not on file  Social Connections: Not on file   SDOH:  SDOH Screenings   Depression (PHQ2-9): Low Risk  (11/23/2021)  Tobacco Use: High Risk (12/30/2021)   Additional Social History:    Pain Medications: See MAR Prescriptions:  See MAR Over the Counter: See MAR History of alcohol / drug use?: No history of alcohol / drug abuse Longest period of sobriety (when/how long): N/A                    Sleep: Good  Appetite:  Good  Current Medications:  Current Facility-Administered Medications  Medication Dose Route Frequency Provider Last Rate Last Admin   acetaminophen (TYLENOL) tablet 650 mg  650 mg Oral Q6H PRN Rankin, Shuvon B, NP   650 mg at 01/12/22 2239   albuterol (VENTOLIN HFA) 108 (90 Base) MCG/ACT inhaler 2 puff  2 puff Inhalation Q6H PRN Rankin, Shuvon B, NP   2 puff at 12/09/21 2102   alum & mag hydroxide-simeth (MAALOX/MYLANTA) 200-200-20 MG/5ML suspension 30 mL  30 mL Oral Q4H PRN Rankin, Shuvon B, NP   30 mL at 12/19/21 2109   apixaban (ELIQUIS) tablet 5 mg  5 mg Oral BID Rankin, Shuvon B, NP   5 mg at 01/14/22 1000   cloZAPine (CLOZARIL) tablet 50 mg  50 mg Oral Daily [name], NP   50 mg at 01/14/22 1000   cloZAPine (CLOZARIL) tablet 75 mg  75 mg Oral QHS [name], NP   75 mg at 01/13/22 2131   diltiazem (CARDIZEM CD) 24 hr capsule 240 mg  240 mg Oral Daily Rankin, Shuvon B, NP   240 mg at 01/14/22 0959   divalproex (DEPAKOTE ER) 24 hr tablet 500 mg  500 mg Oral BID Rankin, Shuvon B, NP   500 mg at 01/14/22 1000   fluticasone furoate-vilanterol (BREO ELLIPTA) 200-25 MCG/ACT 1 puff  1 puff Inhalation Daily Rankin, Shuvon B, NP   1 puff at 01/14/22 0841   haloperidol (HALDOL) tablet 10 mg  10 mg Oral Q8H PRN Rankin, Shuvon B, NP       insulin aspart (novoLOG) injection 0-9 Units  0-9 Units Subcutaneous TID WC Tharon Aquas, NP   2 Units at 01/13/22 1802   insulin glargine-yfgn (SEMGLEE) injection 8 Units  8 Units Subcutaneous Daily Tharon Aquas, NP   8 Units at 01/14/22 1001   latanoprost (XALATAN) 0.005 % ophthalmic solution 1 drop  1 drop Both Eyes QHS Rankin, Shuvon B, NP   1 drop at 01/13/22 2131   magnesium hydroxide (MILK OF MAGNESIA) suspension 30 mL  30 mL Oral Daily  PRN Rankin, Shuvon B, NP       menthol-cetylpyridinium (CEPACOL) lozenge 3 mg  1 lozenge Oral Once [name], NP       menthol-cetylpyridinium (CEPACOL) lozenge 3 mg  1 lozenge Oral PRN [name], NP   3 mg at 01/13/22 1601   metFORMIN (GLUCOPHAGE-XR) 24 hr tablet 1,000 mg  1,000 mg Oral BID WC [name], DO   1,000 mg at 01/14/22 0839   multivitamins with iron tablet 1 tablet  1 tablet Oral Daily [name], DO   1 tablet at 01/14/22 1000   nicotine (NICODERM CQ - dosed in mg/24 hr) patch 7 mg  7 mg Transdermal Daily  Rankin, Shuvon B, NP   7 mg at 01/14/22 1001   polyethylene glycol (MIRALAX / GLYCOLAX) packet 17 g  17 g Oral Daily [name], DO   17 g at 01/14/22 1001   Vitamin D (Ergocalciferol) (DRISDOL) 1.25 MG (50000 UNIT) capsule 50,000 Units  50,000 Units Oral Q7 days Rankin, Shuvon B, NP   50,000 Units at 01/14/22 1007   Current Outpatient Medications  Medication Sig Dispense Refill   Accu-Chek Softclix Lancets lancets Use as directed up to four times daily 100 each 0   apixaban (ELIQUIS) 5 MG TABS tablet Take 1 tablet (5 mg total) by mouth 2 (two) times daily. 60 tablet 0   ARIPiprazole (ABILIFY) 10 MG tablet Take 1 tablet (10 mg total) by mouth daily. 30 tablet 0   Blood Glucose Monitoring Suppl (ACCU-CHEK GUIDE) w/Device KIT Use as directed up to four times daily 1 kit 0   budesonide-formoterol (SYMBICORT) 160-4.5 MCG/ACT inhaler Inhale 2 puffs into the lungs in the morning and at bedtime.     Cholecalciferol (VITAMIN D3) 1.25 MG (50000 UT) CAPS Take 50,000 Units by mouth every Thursday.     cloZAPine (CLOZARIL) 25 MG tablet Take 3 tablets (75 mg total) by mouth at bedtime. 90 tablet 0   clozapine (CLOZARIL) 50 MG tablet Take 1 tablet (50 mg total) by mouth daily. 30 tablet 0   diltiazem (CARDIZEM CD) 240 MG 24 hr capsule Take 1 capsule (240 mg total) by mouth daily. (Patient not taking: Reported on 11/24/2021) 30 capsule 0   divalproex (DEPAKOTE ER) 500 MG 24 hr  tablet Take 1 tablet (500 mg total) by mouth 2 (two) times daily. 60 tablet 0   glucose blood test strip Use as directed up to four times daily 50 each 0   haloperidol (HALDOL) 10 MG tablet Take 1 tablet (10 mg total) by mouth 3 (three) times daily as needed for agitation (and psychotic symptoms).     INGREZZA 40 MG capsule Take 1 capsule (40 mg total) by mouth in the morning. 30 capsule 0   insulin aspart (NOVOLOG) 100 UNIT/ML FlexPen Before each meal 3 times a day, 140-199 - 2 units, 200-250 - 4 units, 251-299 - 6 units,  300-349 - 8 units,  350 or above 10 units. Insulin PEN if approved, provide syringes and needles if needed.Please switch to any approved short acting Insulin if needed. 15 mL 0   insulin glargine (LANTUS) 100 UNIT/ML Solostar Pen Inject 12 Units into the skin daily. 15 mL 0   Insulin Pen Needle 32G X 4 MM MISC Use 4 times a day with insulin, 1 month supply. 100 each 0   latanoprost (XALATAN) 0.005 % ophthalmic solution Place 1 drop into both eyes at bedtime.     metFORMIN (GLUCOPHAGE) 500 MG tablet Take 500 mg by mouth 2 (two) times daily with a meal.     nicotine (NICODERM CQ - DOSED IN MG/24 HR) 7 mg/24hr patch Place 1 patch (7 mg total) onto the skin daily. 28 patch 0   PROAIR HFA 108 (90 Base) MCG/ACT inhaler Inhale 2 puffs into the lungs every 6 (six) hours as needed for wheezing or shortness of breath.      Labs  Lab Results:  No results displayed because visit has over 200 results.    Admission on 11/22/2021, Discharged on 11/22/2021  Component Date Value Ref Range Status   Glucose-Capillary 11/22/2021 169 (H)  70 - 99 mg/dL Final   Glucose reference range  applies only to samples taken after fasting for at least 8 hours.  No results displayed because visit has over 200 results.    Admission on 10/04/2021, Discharged on 10/22/2021  Component Date Value Ref Range Status   SARS Coronavirus 2 by RT PCR 10/04/2021 NEGATIVE  NEGATIVE Final   Comment: (NOTE) SARS-CoV-2  target nucleic acids are NOT DETECTED.  The SARS-CoV-2 RNA is generally detectable in upper respiratory specimens during the acute phase of infection. The lowest concentration of SARS-CoV-2 viral copies this assay can detect is 138 copies/mL. A negative result does not preclude SARS-Cov-2 infection and should not be used as the sole basis for treatment or other patient management decisions. A negative result may occur with  improper specimen collection/handling, submission of specimen other than nasopharyngeal swab, presence of viral mutation(s) within the areas targeted by this assay, and inadequate number of viral copies(<138 copies/mL). A negative result must be combined with clinical observations, patient history, and epidemiological information. The expected result is Negative.  Fact Sheet for Patients:  EntrepreneurPulse.com.au  Fact Sheet for Healthcare Providers:  IncredibleEmployment.be  This test is no                          t yet approved or cleared by the [place] FDA and  has been authorized for detection and/or diagnosis of SARS-CoV-2 by FDA under an Emergency Use Authorization (EUA). This EUA will remain  in effect (meaning this test can be used) for the duration of the COVID-19 declaration under Section 564(b)(1) of the Act, 21 U.S.C.section 360bbb-3(b)(1), unless the authorization is terminated  or revoked sooner.       Influenza A by PCR 10/04/2021 NEGATIVE  NEGATIVE Final   Influenza B by PCR 10/04/2021 NEGATIVE  NEGATIVE Final   Comment: (NOTE) The Xpert Xpress SARS-CoV-2/FLU/RSV plus assay is intended as an aid in the diagnosis of influenza from Nasopharyngeal swab specimens and should not be used as a sole basis for treatment. Nasal washings and aspirates are unacceptable for Xpert Xpress SARS-CoV-2/FLU/RSV testing.  Fact Sheet for Patients: EntrepreneurPulse.com.au  Fact Sheet for Healthcare  Providers: IncredibleEmployment.be  This test is not yet approved or cleared by the [place] FDA and has been authorized for detection and/or diagnosis of SARS-CoV-2 by FDA under an Emergency Use Authorization (EUA). This EUA will remain in effect (meaning this test can be used) for the duration of the COVID-19 declaration under Section 564(b)(1) of the Act, 21 U.S.C. section 360bbb-3(b)(1), unless the authorization is terminated or revoked.  Performed at [facility], [place] [address], [place], [place] [address]    WBC 10/04/2021 8.4  4.0 - 10.5 K/uL Final   RBC 10/04/2021 4.42  3.87 - 5.11 MIL/uL Final   Hemoglobin 10/04/2021 11.6 (L)  12.0 - 15.0 g/dL Final   HCT 10/04/2021 35.7 (L)  36.0 - 46.0 % Final   MCV 10/04/2021 80.8  80.0 - 100.0 fL Final   MCH 10/04/2021 26.2  26.0 - 34.0 pg Final   MCHC 10/04/2021 32.5  30.0 - 36.0 g/dL Final   RDW 10/04/2021 16.0 (H)  11.5 - 15.5 % Final   Platelets 10/04/2021 372  150 - 400 K/uL Final   nRBC 10/04/2021 0.0  0.0 - 0.2 % Final   Neutrophils Relative % 10/04/2021 68  % Final   Neutro Abs 10/04/2021 5.7  1.7 - 7.7 K/uL Final   Lymphocytes Relative 10/04/2021 22  % Final   Lymphs  Abs 10/04/2021 1.8  0.7 - 4.0 K/uL Final   Monocytes Relative 10/04/2021 8  % Final   Monocytes Absolute 10/04/2021 0.7  0.1 - 1.0 K/uL Final   Eosinophils Relative 10/04/2021 1  % Final   Eosinophils Absolute 10/04/2021 0.1  0.0 - 0.5 K/uL Final   Basophils Relative 10/04/2021 1  % Final   Basophils Absolute 10/04/2021 0.1  0.0 - 0.1 K/uL Final   Immature Granulocytes 10/04/2021 0  % Final   Abs Immature Granulocytes 10/04/2021 0.03  0.00 - 0.07 K/uL Final   Performed at [facility], [place] [address], [place], [place] [address]   Sodium 10/04/2021 136  135 - 145 mmol/L Final   Potassium 10/04/2021 4.2  3.5 - 5.1 mmol/L Final   Chloride 10/04/2021 104  98 - 111 mmol/L Final   CO2 10/04/2021 25  22 - 32 mmol/L Final    Glucose, Bld 10/04/2021 100 (H)  70 - 99 mg/dL Final   Glucose reference range applies only to samples taken after fasting for at least 8 hours.   BUN 10/04/2021 9  6 - 20 mg/dL Final   Creatinine, Ser 10/04/2021 0.52  0.44 - 1.00 mg/dL Final   Calcium 10/04/2021 9.0  8.9 - 10.3 mg/dL Final   Total Protein 10/04/2021 7.0  6.5 - 8.1 g/dL Final   Albumin 10/04/2021 3.2 (L)  3.5 - 5.0 g/dL Final   AST 10/04/2021 13 (L)  15 - 41 U/L Final   ALT 10/04/2021 10  0 - 44 U/L Final   Alkaline Phosphatase 10/04/2021 61  38 - 126 U/L Final   Total Bilirubin 10/04/2021 0.3  0.3 - 1.2 mg/dL Final   GFR, Estimated 10/04/2021 >60  >60 mL/min Final   Comment: (NOTE) Calculated using the CKD-EPI Creatinine Equation (2021)    Anion gap 10/04/2021 7  5 - 15 Final   Performed at [facility] [address] [place], [place] [address]   Hgb A1c MFr Bld 10/04/2021 7.0 (H)  4.8 - 5.6 % Final   Comment: (NOTE) Pre diabetes:          5.7%-6.4%  Diabetes:              >6.4%  Glycemic control for   <7.0% adults with diabetes    Mean Plasma Glucose 10/04/2021 154.2  mg/dL Final   Performed at [facility], [place] [address], [place], [place] [address]   Cholesterol 10/04/2021 178  0 - 200 mg/dL Final   Triglycerides 10/04/2021 155 (H)  <150 mg/dL Final   HDL 10/04/2021 52  >40 mg/dL Final   Total CHOL/HDL Ratio 10/04/2021 3.4  RATIO Final   VLDL 10/04/2021 31  0 - 40 mg/dL Final   LDL Cholesterol 10/04/2021 95  0 - 99 mg/dL Final   Comment:        Total Cholesterol/HDL:CHD Risk Coronary Heart Disease Risk Table                     Men   Women  1/2 Average Risk   3.4   3.3  Average Risk       5.0   4.4  2 X Average Risk   9.6   7.1  3 X Average Risk  23.4   11.0        Use the calculated Patient Ratio above and the CHD Risk Table to determine the patient's CHD Risk.  ATP III CLASSIFICATION (LDL):  <100     mg/dL   Optimal  100-129  mg/dL   Near or Above                     Optimal  130-159  mg/dL   Borderline  160-189  mg/dL   High  >190     mg/dL   Very High Performed at [facility] [address] [place], [place] [address]    POC Amphetamine UR 10/04/2021 None Detected  NONE DETECTED (Cut Off Level 1000 ng/mL) Final   POC Secobarbital (BAR) 10/04/2021 None Detected  NONE DETECTED (Cut Off Level 300 ng/mL) Final   POC Buprenorphine (BUP) 10/04/2021 None Detected  NONE DETECTED (Cut Off Level 10 ng/mL) Final   POC Oxazepam (BZO) 10/04/2021 None Detected  NONE DETECTED (Cut Off Level 300 ng/mL) Final   POC Cocaine UR 10/04/2021 None Detected  NONE DETECTED (Cut Off Level 300 ng/mL) Final   POC Methamphetamine UR 10/04/2021 None Detected  NONE DETECTED (Cut Off Level 1000 ng/mL) Final   POC Morphine 10/04/2021 None Detected  NONE DETECTED (Cut Off Level 300 ng/mL) Final   POC Methadone UR 10/04/2021 None Detected  NONE DETECTED (Cut Off Level 300 ng/mL) Final   POC Oxycodone UR 10/04/2021 Positive (A)  NONE DETECTED (Cut Off Level 100 ng/mL) Final   POC Marijuana UR 10/04/2021 None Detected  NONE DETECTED (Cut Off Level 50 ng/mL) Final   SARSCOV2ONAVIRUS 2 AG 10/04/2021 NEGATIVE  NEGATIVE Final   Comment: (NOTE) SARS-CoV-2 antigen NOT DETECTED.   Negative results are presumptive.  Negative results do not preclude SARS-CoV-2 infection and should not be used as the sole basis for treatment or other patient management decisions, including infection  control decisions, particularly in the presence of clinical signs and  symptoms consistent with COVID-19, or in those who have been in contact with the virus.  Negative results must be combined with clinical observations, patient history, and epidemiological information. The expected result is Negative.  Fact Sheet for Patients: HandmadeRecipes.com.cy  Fact Sheet for Healthcare Providers: FuneralLife.at  This test is not yet approved or cleared by the  [place] FDA and  has been authorized for detection and/or diagnosis of SARS-CoV-2 by FDA under an Emergency Use Authorization (EUA).  This EUA will remain in effect (meaning this test can be used) for the duration of  the COV                          ID-19 declaration under Section 564(b)(1) of the Act, 21 U.S.C. section 360bbb-3(b)(1), unless the authorization is terminated or revoked sooner.     Valproic Acid Lvl 10/04/2021 51  50.0 - 100.0 ug/mL Final   Performed at [facility] [address], [place], [place] [address]   Valproic Acid Lvl 10/08/2021 57  50.0 - 100.0 ug/mL Final   Performed at [facility] [address], [place], [place] [address]   Glucose-Capillary 10/09/2021 123 (H)  70 - 99 mg/dL Final   Glucose reference range applies only to samples taken after fasting for at least 8 hours.  Admission on 09/09/2021, Discharged on 09/10/2021  Component Date Value Ref Range Status   Sodium 09/09/2021 134 (L)  135 - 145 mmol/L Final   Potassium 09/09/2021 4.3  3.5 - 5.1 mmol/L Final   Chloride 09/09/2021 99  98 - 111 mmol/L Final   CO2 09/09/2021 25  22 - 32 mmol/L Final   Glucose, Bld 09/09/2021 107 (H)  70 - 99 mg/dL Final   Glucose reference range applies only to samples taken after fasting for at least 8 hours.   BUN 09/09/2021 12  6 - 20 mg/dL Final   Creatinine, Ser 09/09/2021 0.74  0.44 - 1.00 mg/dL Final   Calcium 09/09/2021 9.0  8.9 - 10.3 mg/dL Final   Total Protein 09/09/2021 7.4  6.5 - 8.1 g/dL Final   Albumin 09/09/2021 3.1 (L)  3.5 - 5.0 g/dL Final   AST 09/09/2021 13 (L)  15 - 41 U/L Final   ALT 09/09/2021 13  0 - 44 U/L Final   Alkaline Phosphatase 09/09/2021 66  38 - 126 U/L Final   Total Bilirubin 09/09/2021 0.2 (L)  0.3 - 1.2 mg/dL Final   GFR, Estimated 09/09/2021 >60  >60 mL/min Final   Comment: (NOTE) Calculated using the CKD-EPI Creatinine Equation (2021)    Anion gap 09/09/2021 10  5 - 15 Final   Performed at [facility], [place] [address] [place], [place] [address]   WBC 09/09/2021 8.5  4.0 - 10.5 K/uL Final   RBC 09/09/2021 4.53  3.87 - 5.11 MIL/uL Final   Hemoglobin 09/09/2021 11.8 (L)  12.0 - 15.0 g/dL Final   HCT 09/09/2021 38.0  36.0 - 46.0 % Final   MCV 09/09/2021 83.9  80.0 - 100.0 fL Final   MCH 09/09/2021 26.0  26.0 - 34.0 pg Final   MCHC 09/09/2021 31.1  30.0 - 36.0 g/dL Final   RDW 09/09/2021 17.8 (H)  11.5 - 15.5 % Final   Platelets 09/09/2021 442 (H)  150 - 400 K/uL Final   nRBC 09/09/2021 0.0  0.0 - 0.2 % Final   Neutrophils Relative % 09/09/2021 70  % Final   Neutro Abs 09/09/2021 5.9  1.7 - 7.7 K/uL Final   Lymphocytes Relative 09/09/2021 20  % Final   Lymphs Abs 09/09/2021 1.7  0.7 - 4.0 K/uL Final   Monocytes Relative 09/09/2021 8  % Final   Monocytes Absolute 09/09/2021 0.7  0.1 - 1.0 K/uL Final   Eosinophils Relative 09/09/2021 1  % Final   Eosinophils Absolute 09/09/2021 0.1  0.0 - 0.5 K/uL Final   Basophils Relative 09/09/2021 1  % Final   Basophils Absolute 09/09/2021 0.1  0.0 - 0.1 K/uL Final   Immature Granulocytes 09/09/2021 0  % Final   Abs Immature Granulocytes 09/09/2021 0.03  0.00 - 0.07 K/uL Final   Performed at [facility], [place] [address], [place], [place] [address]   Ammonia 09/09/2021 37 (H)  9 - 35 umol/L Final   Comment: HEMOLYSIS AT THIS LEVEL MAY AFFECT RESULT Performed at [facility], [place] [address], [place], [place] [address]    Opiates 09/09/2021 NONE DETECTED  NONE DETECTED Final   Cocaine 09/09/2021 NONE DETECTED  NONE DETECTED Final   Benzodiazepines 09/09/2021 NONE DETECTED  NONE DETECTED Final   Amphetamines 09/09/2021 NONE DETECTED  NONE DETECTED Final   Tetrahydrocannabinol 09/09/2021 NONE DETECTED  NONE DETECTED Final   Barbiturates 09/09/2021 NONE DETECTED  NONE DETECTED Final   Comment: (NOTE) DRUG SCREEN FOR MEDICAL PURPOSES ONLY.  IF CONFIRMATION IS NEEDED FOR ANY PURPOSE, NOTIFY LAB WITHIN 5 DAYS.  LOWEST DETECTABLE  LIMITS FOR URINE DRUG SCREEN Drug Class                     Cutoff (ng/mL) Amphetamine and  metabolites    1000 Barbiturate and metabolites    200 Benzodiazepine                 063 Tricyclics and metabolites     300 Opiates and metabolites        300 Cocaine and metabolites        300 THC                            50 Performed at [facility], [place] [address], [place], [place] [address]    Alcohol, Ethyl (B) 09/09/2021 <10  <10 mg/dL Final   Comment: (NOTE) Lowest detectable limit for serum alcohol is 10 mg/dL.  For medical purposes only. Performed at [facility], [place] [address] [place], [place] [address]    Lipase 09/09/2021 33  11 - 51 U/L Final   Performed at [facility] [address] [place], [place] [address]   Color, Urine 09/09/2021 COLORLESS (A)  YELLOW Final   APPearance 09/09/2021 CLEAR  CLEAR Final   Specific Gravity, Urine 09/09/2021 1.002 (L)  1.005 - 1.030 Final   pH 09/09/2021 6.0  5.0 - 8.0 Final   Glucose, UA 09/09/2021 NEGATIVE  NEGATIVE mg/dL Final   Hgb urine dipstick 09/09/2021 NEGATIVE  NEGATIVE Final   Bilirubin Urine 09/09/2021 NEGATIVE  NEGATIVE Final   Ketones, ur 09/09/2021 NEGATIVE  NEGATIVE mg/dL Final   Protein, ur 09/09/2021 NEGATIVE  NEGATIVE mg/dL Final   Nitrite 09/09/2021 NEGATIVE  NEGATIVE Final   Leukocytes,Ua 09/09/2021 NEGATIVE  NEGATIVE Final   Performed at [facility] [address] [place], [place] [address]   Specimen Description 09/09/2021 URINE, CLEAN CATCH   Final   Special Requests 09/09/2021 NONE   Final   Culture 09/09/2021  (A)   Final                   Value:<10,000 COLONIES/mL INSIGNIFICANT GROWTH Performed at [facility], [place] [address], [place], [place] [address]    Report Status 09/09/2021 09/10/2021 FINAL   Final   D-Dimer, Quant 09/09/2021 0.92 (H)  0.00 - 0.50 ug/mL-FEU Final   Comment: (NOTE) At the manufacturer cut-off value of 0.5 g/mL FEU, this assay has a negative  predictive value of 95-100%.This assay is intended for use in conjunction with a clinical pretest probability (PTP) assessment model to exclude pulmonary embolism (PE) and deep venous thrombosis (DVT) in outpatients suspected of PE or DVT. Results should be correlated with clinical presentation. Performed at [facility], [place] [address], [place], [place] [address]    Troponin I (High Sensitivity) 09/09/2021 5  <18 ng/L Final   Comment: (NOTE) Elevated high sensitivity troponin I (hsTnI) values and significant  changes across serial measurements may suggest ACS but many other  chronic and acute conditions are known to elevate hsTnI results.  Refer to the "Links" section for chest pain algorithms and additional  guidance. Performed at [facility], [place] [address] [place], [place] [address]    BP 09/10/2021 135/83  mmHg Final   S' Lateral 09/10/2021 2.30  cm Final   Area-P 1/2 09/10/2021 5.58  cm2 Final    Blood Alcohol level:  Lab Results  Component Value Date   ETH <10 11/23/2021   ETH <10 61/60/7371    Metabolic Disorder Labs: Lab Results  Component Value Date   HGBA1C 6.7 (H) 12/28/2021  MPG 145.59 12/28/2021   MPG 154.2 10/04/2021   Lab Results  Component Value Date   PROLACTIN 3.8 (L) 11/23/2021   Lab Results  Component Value Date   CHOL 171 11/23/2021   TRIG 125 11/23/2021   HDL 65 11/23/2021   CHOLHDL 2.6 11/23/2021   VLDL 25 11/23/2021   LDLCALC 81 11/23/2021   LDLCALC 95 10/04/2021    Therapeutic Lab Levels: No results found for: "LITHIUM" Lab Results  Component Value Date   VALPROATE 33 (L) 12/28/2021   VALPROATE 54 12/03/2021   No results found for: "CBMZ"  Physical Findings   PHQ2-9    [place] [facility] from 11/23/2021 in [facility]  PHQ-2 Total Score 2  PHQ-9 Total Score 3      Flowsheet Row ED from 11/23/2021 in [facility] Most recent reading at 11/23/2021  6:44 PM  ED from 11/22/2021 in [place] [facility] Most recent reading at 11/22/2021  8:28 PM ED from 11/22/2021 in [place] [facility] Most recent reading at 11/22/2021  1:24 AM  C-SSRS RISK CATEGORY No Risk No Risk No Risk        Musculoskeletal  Strength & Muscle Tone: within normal limits Gait & Station: normal Patient leans: N/A  Psychiatric Specialty Exam  Presentation  General Appearance:  Appropriate for Environment; Casual  Eye Contact: Good  Speech: Clear and Coherent; Normal Rate  Speech Volume: Normal  Handedness: Right   Mood and Affect  Mood: Euthymic  Affect: Flat   Thought Process  Thought Processes: Coherent  Descriptions of Associations:Intact  Orientation:Full (Time, Place and Person)  Thought Content:Logical  Diagnosis of Schizophrenia or Schizoaffective disorder in past: Yes  Duration of Psychotic Symptoms: No data recorded  Hallucinations:Hallucinations: None  Ideas of Reference:None  Suicidal Thoughts:Suicidal Thoughts: No  Homicidal Thoughts:Homicidal Thoughts: No   Sensorium  Memory: Immediate Fair  Judgment: Intact  Insight: Present   Executive Functions  Concentration: Fair  Attention Span: Fair  Recall: [employer] of Knowledge: Fair  Language: Fair   Psychomotor Activity  Psychomotor Activity: Psychomotor Activity: Normal   Assets  Assets: Communication Skills; Desire for Improvement; Financial Resources/Insurance   Sleep  Sleep: Sleep: Good   No data recorded  Physical Exam  Physical Exam Constitutional:      General: She is not in acute distress.    Appearance: She is not ill-appearing, toxic-appearing or diaphoretic.  Eyes:     General: No scleral icterus. Cardiovascular:     Rate and Rhythm: Normal rate.  Pulmonary:     Effort: Pulmonary effort is normal. No respiratory distress.  Neurological:     Mental Status: She is alert and  oriented to person, place, and time.  Psychiatric:        Attention and Perception: Attention and perception normal.        Mood and Affect: Mood normal. Affect is flat.        Speech: Speech normal.        Behavior: Behavior normal. Behavior is cooperative.        Thought Content: Thought content normal.    Review of Systems  Constitutional:  Negative for chills and fever.  Respiratory:  Negative for shortness of breath.   Cardiovascular:  Negative for chest pain and palpitations.  Gastrointestinal:  Negative for abdominal pain.  Neurological:  Negative for headaches.   Blood pressure 129/73, pulse 99, temperature 97.8 F (36.6 C), temperature source Tympanic, resp.  rate 16, SpO2 99 %. There is no height or weight on file to calculate BMI.  Treatment Plan Summary: Plan Pt is psychiatrically cleared. Disposition is pending placement.  Tharon Aquas, NP 01/14/2022 11:22 AM

## 2022-01-14 NOTE — ED Notes (Signed)
Patient denies SI/HI and AVH. Patient eating snacks and  calm and cooperative  on the unit. Patient is being monitored for safety.

## 2022-01-14 NOTE — ED Notes (Signed)
Patient is resting comfortably. 

## 2022-01-15 ENCOUNTER — Encounter: Payer: Medicaid Other | Admitting: Obstetrics and Gynecology

## 2022-01-15 DIAGNOSIS — F209 Schizophrenia, unspecified: Secondary | ICD-10-CM | POA: Diagnosis not present

## 2022-01-15 LAB — CBC WITH DIFFERENTIAL/PLATELET
Abs Immature Granulocytes: 0.03 10*3/uL (ref 0.00–0.07)
Basophils Absolute: 0.1 10*3/uL (ref 0.0–0.1)
Basophils Relative: 1 %
Eosinophils Absolute: 0.1 10*3/uL (ref 0.0–0.5)
Eosinophils Relative: 2 %
HCT: 33 % — ABNORMAL LOW (ref 36.0–46.0)
Hemoglobin: 10.3 g/dL — ABNORMAL LOW (ref 12.0–15.0)
Immature Granulocytes: 1 %
Lymphocytes Relative: 37 %
Lymphs Abs: 2.3 10*3/uL (ref 0.7–4.0)
MCH: 25.3 pg — ABNORMAL LOW (ref 26.0–34.0)
MCHC: 31.2 g/dL (ref 30.0–36.0)
MCV: 81.1 fL (ref 80.0–100.0)
Monocytes Absolute: 0.6 10*3/uL (ref 0.1–1.0)
Monocytes Relative: 9 %
Neutro Abs: 3.2 10*3/uL (ref 1.7–7.7)
Neutrophils Relative %: 50 %
Platelets: 330 10*3/uL (ref 150–400)
RBC: 4.07 MIL/uL (ref 3.87–5.11)
RDW: 17.9 % — ABNORMAL HIGH (ref 11.5–15.5)
WBC: 6.3 10*3/uL (ref 4.0–10.5)
nRBC: 0 % (ref 0.0–0.2)

## 2022-01-15 LAB — GLUCOSE, CAPILLARY
Glucose-Capillary: 110 mg/dL — ABNORMAL HIGH (ref 70–99)
Glucose-Capillary: 206 mg/dL — ABNORMAL HIGH (ref 70–99)
Glucose-Capillary: 137 mg/dL — ABNORMAL HIGH (ref 70–99)
Glucose-Capillary: 147 mg/dL — ABNORMAL HIGH (ref 70–99)

## 2022-01-15 NOTE — ED Notes (Signed)
Patient was complaining of right foot pain and was given Tylenol 650 mg. Patient was seen by the provider regarding the pain. Patient is now resting at this time and being monitored for safety.

## 2022-01-15 NOTE — ED Notes (Signed)
Patient complaining of mild pain in foot - no SI, HI or AVH - will continue to monitor for safety

## 2022-01-15 NOTE — ED Notes (Signed)
Patient resting with no sxs of distress noted - will continue to monitor for safety 

## 2022-01-15 NOTE — ED Provider Notes (Signed)
Behavioral Health Progress Note  Date and Time: 01/15/2022 9:28 AM Name: Teresa Murillo MRN:  [id number]  Subjective:   Teresa Murillo is a 61 y.o. female with a past psychiatric history of schizophrenia, aggressive behavior, and possible intellectual disability presenting to [facility] on 11/23/21 voluntarily as a walk in via [facility] with complaints that she was locked out of the group home. Patient has been dismissed from group home due to multiple elopements. Pt had already been discharged from this facility but had been living there temporarily while DSS was looking for new placement. Pt is boarding at [facility].   AID to Capacity Evaluation (ACE) completed by Dr. Louis Meckel, Dr. Dwyane Dee on 12/11/2021.  Please see media tab for full details.    [name], PhD completed: Wechsler Adult Intelligence Scale-4, Teresa Murillo achieved a full-scale IQ score of 73 and a percentile rank of 4 placing her in the borderline range of intellectual functioning (12/14/2021) Please see consult note from [name], PhD on 12/14/2021.  On reassessment, pt is a&ox3, in no acute distress, non-toxic appearing. She is lying down asleep, although easily wakens to name being called. She denies SI/VI/HI, AVH, paranoia. She denies any psychiatric or physical complaints. She states she is "ready to go". She verbalizes understanding that staff are continuing to follow up on placement. There is no evidence of agitation, aggression or distractibility. No delusions or paranoia elicited. Pt remains psychiatrically cleared. Disposition is pending placement.   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 On the Wechsler Adult Intelligence Scale-4, Teresa Murillo achieved a full-scale IQ score of 73 and a percentile rank of 4 placing her in the borderline range of intellectual functioning.    Chronic obstructive pulmonary disease (COPD) (HCC)    Glaucoma    Hyperlipidemia    Hypertension    Iron deficiency    Schizoaffective disorder (HCC)    Type 2 diabetes mellitus (HCC)     Past Surgical History:  Procedure Laterality Date   TUBAL LIGATION     Family History:  Family History  Problem Relation Age of Onset   Breast cancer Maternal Grandmother    Family Psychiatric  History: None reported Social History:  Social History   Substance and Sexual Activity  Alcohol Use Yes     Social History   Substance and Sexual Activity  Drug Use Not Currently    Social History   Socioeconomic History   Marital status: Divorced    Spouse name: Not on file   Number of children: Not on file   Years of education: Not on file   Highest education level: Not on file  Occupational History   Not on file  Tobacco Use   Smoking status: Every Day    Packs/day: 1.00    Types: Cigars, Cigarettes   Smokeless tobacco: Current  Vaping Use   Vaping Use: Never used  Substance and Sexual Activity   Alcohol use: Yes   Drug use: Not Currently   Sexual activity: Not Currently    Birth control/protection: Surgical  Other Topics Concern   Not on file  Social History Narrative   Not on file   Social  Determinants of Health   Financial Resource Strain: Not on file  Food Insecurity: Not on file  Transportation Needs: Not on file  Physical Activity: Not on file  Stress: Not on file  Social Connections: Not on file   SDOH:  SDOH Screenings   Depression (PHQ2-9): Low Risk  (11/23/2021)  Tobacco Use: High Risk (12/30/2021)   Additional Social History:    Pain Medications: See MAR Prescriptions: See MAR Over the Counter: See MAR History  of alcohol / drug use?: No history of alcohol / drug abuse Longest period of sobriety (when/how long): N/A                    Sleep: Good  Appetite:  Good  Current Medications:  Current Facility-Administered Medications  Medication Dose Route Frequency Provider Last Rate Last Admin   acetaminophen (TYLENOL) tablet 650 mg  650 mg Oral Q6H PRN Rankin, Shuvon B, NP   650 mg at 01/15/22 0037   albuterol (VENTOLIN HFA) 108 (90 Base) MCG/ACT inhaler 2 puff  2 puff Inhalation Q6H PRN Rankin, Shuvon B, NP   2 puff at 12/09/21 2102   alum & mag hydroxide-simeth (MAALOX/MYLANTA) 200-200-20 MG/5ML suspension 30 mL  30 mL Oral Q4H PRN Rankin, Shuvon B, NP   30 mL at 12/19/21 2109   apixaban (ELIQUIS) tablet 5 mg  5 mg Oral BID Rankin, Shuvon B, NP   5 mg at 01/15/22 0910   cloZAPine (CLOZARIL) tablet 50 mg  50 mg Oral Daily [name], NP   50 mg at 01/15/22 0910   cloZAPine (CLOZARIL) tablet 75 mg  75 mg Oral Dorie Rank, NP   75 mg at 01/14/22 2214   diltiazem (CARDIZEM CD) 24 hr capsule 240 mg  240 mg Oral Daily Rankin, Shuvon B, NP   240 mg at 01/15/22 0911   divalproex (DEPAKOTE ER) 24 hr tablet 500 mg  500 mg Oral BID Rankin, Shuvon B, NP   500 mg at 01/15/22 0910   fluticasone furoate-vilanterol (BREO ELLIPTA) 200-25 MCG/ACT 1 puff  1 puff Inhalation Daily Rankin, Shuvon B, NP   1 puff at 01/15/22 0910   haloperidol (HALDOL) tablet 10 mg  10 mg Oral Q8H PRN Rankin, Shuvon B, NP       insulin aspart (novoLOG) injection 0-9 Units  0-9 Units Subcutaneous TID WC Tharon Aquas, NP   1 Units at 01/14/22 1714   insulin glargine-yfgn (SEMGLEE) injection 8 Units  8 Units Subcutaneous Daily Tharon Aquas, NP   8 Units at 01/14/22 1001   latanoprost (XALATAN) 0.005 % ophthalmic solution 1 drop  1 drop Both Eyes QHS Rankin, Shuvon B, NP   1 drop at 01/14/22 2215   magnesium hydroxide (MILK OF MAGNESIA) suspension 30 mL  30 mL Oral Daily PRN Rankin, Shuvon B, NP        menthol-cetylpyridinium (CEPACOL) lozenge 3 mg  1 lozenge Oral PRN [name], NP   3 mg at 01/13/22 1601   metFORMIN (GLUCOPHAGE-XR) 24 hr tablet 1,000 mg  1,000 mg Oral BID WC [name], DO   1,000 mg at 01/15/22 0900   multivitamins with iron tablet 1 tablet  1 tablet Oral Daily [name], DO   1 tablet at 01/15/22 0910   nicotine (NICODERM CQ - dosed in mg/24 hr) patch 7 mg  7 mg Transdermal Daily Rankin, Shuvon B, NP   7 mg at 01/15/22 0912   polyethylene glycol (MIRALAX / GLYCOLAX) packet 17  g  17 g Oral Daily [name], DO   17 g at 01/15/22 4818   Vitamin D (Ergocalciferol) (DRISDOL) 1.25 MG (50000 UNIT) capsule 50,000 Units  50,000 Units Oral Q7 days Rankin, Shuvon B, NP   50,000 Units at 01/14/22 1007   Current Outpatient Medications  Medication Sig Dispense Refill   Accu-Chek Softclix Lancets lancets Use as directed up to four times daily 100 each 0   apixaban (ELIQUIS) 5 MG TABS tablet Take 1 tablet (5 mg total) by mouth 2 (two) times daily. 60 tablet 0   ARIPiprazole (ABILIFY) 10 MG tablet Take 1 tablet (10 mg total) by mouth daily. 30 tablet 0   Blood Glucose Monitoring Suppl (ACCU-CHEK GUIDE) w/Device KIT Use as directed up to four times daily 1 kit 0   budesonide-formoterol (SYMBICORT) 160-4.5 MCG/ACT inhaler Inhale 2 puffs into the lungs in the morning and at bedtime.     Cholecalciferol (VITAMIN D3) 1.25 MG (50000 UT) CAPS Take 50,000 Units by mouth every Thursday.     cloZAPine (CLOZARIL) 25 MG tablet Take 3 tablets (75 mg total) by mouth at bedtime. 90 tablet 0   clozapine (CLOZARIL) 50 MG tablet Take 1 tablet (50 mg total) by mouth daily. 30 tablet 0   diltiazem (CARDIZEM CD) 240 MG 24 hr capsule Take 1 capsule (240 mg total) by mouth daily. (Patient not taking: Reported on 11/24/2021) 30 capsule 0   divalproex (DEPAKOTE ER) 500 MG 24 hr tablet Take 1 tablet (500 mg total) by mouth 2 (two) times daily. 60 tablet 0   glucose blood test strip Use as directed up to  four times daily 50 each 0   haloperidol (HALDOL) 10 MG tablet Take 1 tablet (10 mg total) by mouth 3 (three) times daily as needed for agitation (and psychotic symptoms).     INGREZZA 40 MG capsule Take 1 capsule (40 mg total) by mouth in the morning. 30 capsule 0   insulin aspart (NOVOLOG) 100 UNIT/ML FlexPen Before each meal 3 times a day, 140-199 - 2 units, 200-250 - 4 units, 251-299 - 6 units,  300-349 - 8 units,  350 or above 10 units. Insulin PEN if approved, provide syringes and needles if needed.Please switch to any approved short acting Insulin if needed. 15 mL 0   insulin glargine (LANTUS) 100 UNIT/ML Solostar Pen Inject 12 Units into the skin daily. 15 mL 0   Insulin Pen Needle 32G X 4 MM MISC Use 4 times a day with insulin, 1 month supply. 100 each 0   latanoprost (XALATAN) 0.005 % ophthalmic solution Place 1 drop into both eyes at bedtime.     metFORMIN (GLUCOPHAGE) 500 MG tablet Take 500 mg by mouth 2 (two) times daily with a meal.     nicotine (NICODERM CQ - DOSED IN MG/24 HR) 7 mg/24hr patch Place 1 patch (7 mg total) onto the skin daily. 28 patch 0   PROAIR HFA 108 (90 Base) MCG/ACT inhaler Inhale 2 puffs into the lungs every 6 (six) hours as needed for wheezing or shortness of breath.      Labs  Lab Results:  No results displayed because visit has over 200 results.    Admission on 11/22/2021, Discharged on 11/22/2021  Component Date Value Ref Range Status   Glucose-Capillary 11/22/2021 169 (H)  70 - 99 mg/dL Final   Glucose reference range applies only to samples taken after fasting for at least 8 hours.  No results displayed because visit has over  200 results.    Admission on 10/04/2021, Discharged on 10/22/2021  Component Date Value Ref Range Status   SARS Coronavirus 2 by RT PCR 10/04/2021 NEGATIVE  NEGATIVE Final   Comment: (NOTE) SARS-CoV-2 target nucleic acids are NOT DETECTED.  The SARS-CoV-2 RNA is generally detectable in upper respiratory specimens during the  acute phase of infection. The lowest concentration of SARS-CoV-2 viral copies this assay can detect is 138 copies/mL. A negative result does not preclude SARS-Cov-2 infection and should not be used as the sole basis for treatment or other patient management decisions. A negative result may occur with  improper specimen collection/handling, submission of specimen other than nasopharyngeal swab, presence of viral mutation(s) within the areas targeted by this assay, and inadequate number of viral copies(<138 copies/mL). A negative result must be combined with clinical observations, patient history, and epidemiological information. The expected result is Negative.  Fact Sheet for Patients:  EntrepreneurPulse.com.au  Fact Sheet for Healthcare Providers:  IncredibleEmployment.be  This test is no                          t yet approved or cleared by the [place] FDA and  has been authorized for detection and/or diagnosis of SARS-CoV-2 by FDA under an Emergency Use Authorization (EUA). This EUA will remain  in effect (meaning this test can be used) for the duration of the COVID-19 declaration under Section 564(b)(1) of the Act, 21 U.S.C.section 360bbb-3(b)(1), unless the authorization is terminated  or revoked sooner.       Influenza A by PCR 10/04/2021 NEGATIVE  NEGATIVE Final   Influenza B by PCR 10/04/2021 NEGATIVE  NEGATIVE Final   Comment: (NOTE) The Xpert Xpress SARS-CoV-2/FLU/RSV plus assay is intended as an aid in the diagnosis of influenza from Nasopharyngeal swab specimens and should not be used as a sole basis for treatment. Nasal washings and aspirates are unacceptable for Xpert Xpress SARS-CoV-2/FLU/RSV testing.  Fact Sheet for Patients: EntrepreneurPulse.com.au  Fact Sheet for Healthcare Providers: IncredibleEmployment.be  This test is not yet approved or cleared by the [place] FDA  and has been authorized for detection and/or diagnosis of SARS-CoV-2 by FDA under an Emergency Use Authorization (EUA). This EUA will remain in effect (meaning this test can be used) for the duration of the COVID-19 declaration under Section 564(b)(1) of the Act, 21 U.S.C. section 360bbb-3(b)(1), unless the authorization is terminated or revoked.  Performed at [facility], [place] [address] [place], [place] [address]    WBC 10/04/2021 8.4  4.0 - 10.5 K/uL Final   RBC 10/04/2021 4.42  3.87 - 5.11 MIL/uL Final   Hemoglobin 10/04/2021 11.6 (L)  12.0 - 15.0 g/dL Final   HCT 10/04/2021 35.7 (L)  36.0 - 46.0 % Final   MCV 10/04/2021 80.8  80.0 - 100.0 fL Final   MCH 10/04/2021 26.2  26.0 - 34.0 pg Final   MCHC 10/04/2021 32.5  30.0 - 36.0 g/dL Final   RDW 10/04/2021 16.0 (H)  11.5 - 15.5 % Final   Platelets 10/04/2021 372  150 - 400 K/uL Final   nRBC 10/04/2021 0.0  0.0 - 0.2 % Final   Neutrophils Relative % 10/04/2021 68  % Final   Neutro Abs 10/04/2021 5.7  1.7 - 7.7 K/uL Final   Lymphocytes Relative 10/04/2021 22  % Final   Lymphs Abs 10/04/2021 1.8  0.7 - 4.0 K/uL Final   Monocytes Relative 10/04/2021 8  % Final  Monocytes Absolute 10/04/2021 0.7  0.1 - 1.0 K/uL Final   Eosinophils Relative 10/04/2021 1  % Final   Eosinophils Absolute 10/04/2021 0.1  0.0 - 0.5 K/uL Final   Basophils Relative 10/04/2021 1  % Final   Basophils Absolute 10/04/2021 0.1  0.0 - 0.1 K/uL Final   Immature Granulocytes 10/04/2021 0  % Final   Abs Immature Granulocytes 10/04/2021 0.03  0.00 - 0.07 K/uL Final   Performed at [facility], [place] [address] [place], [place] [address]   Sodium 10/04/2021 136  135 - 145 mmol/L Final   Potassium 10/04/2021 4.2  3.5 - 5.1 mmol/L Final   Chloride 10/04/2021 104  98 - 111 mmol/L Final   CO2 10/04/2021 25  22 - 32 mmol/L Final   Glucose, Bld 10/04/2021 100 (H)  70 - 99 mg/dL Final   Glucose reference range applies only to samples taken after fasting for at  least 8 hours.   BUN 10/04/2021 9  6 - 20 mg/dL Final   Creatinine, Ser 10/04/2021 0.52  0.44 - 1.00 mg/dL Final   Calcium 10/04/2021 9.0  8.9 - 10.3 mg/dL Final   Total Protein 10/04/2021 7.0  6.5 - 8.1 g/dL Final   Albumin 10/04/2021 3.2 (L)  3.5 - 5.0 g/dL Final   AST 10/04/2021 13 (L)  15 - 41 U/L Final   ALT 10/04/2021 10  0 - 44 U/L Final   Alkaline Phosphatase 10/04/2021 61  38 - 126 U/L Final   Total Bilirubin 10/04/2021 0.3  0.3 - 1.2 mg/dL Final   GFR, Estimated 10/04/2021 >60  >60 mL/min Final   Comment: (NOTE) Calculated using the CKD-EPI Creatinine Equation (2021)    Anion gap 10/04/2021 7  5 - 15 Final   Performed at [facility] [address], [place], [place] [address]   Hgb A1c MFr Bld 10/04/2021 7.0 (H)  4.8 - 5.6 % Final   Comment: (NOTE) Pre diabetes:          5.7%-6.4%  Diabetes:              >6.4%  Glycemic control for   <7.0% adults with diabetes    Mean Plasma Glucose 10/04/2021 154.2  mg/dL Final   Performed at [facility], [place] [address], [place], [place] [address]   Cholesterol 10/04/2021 178  0 - 200 mg/dL Final   Triglycerides 10/04/2021 155 (H)  <150 mg/dL Final   HDL 10/04/2021 52  >40 mg/dL Final   Total CHOL/HDL Ratio 10/04/2021 3.4  RATIO Final   VLDL 10/04/2021 31  0 - 40 mg/dL Final   LDL Cholesterol 10/04/2021 95  0 - 99 mg/dL Final   Comment:        Total Cholesterol/HDL:CHD Risk Coronary Heart Disease Risk Table                     Men   Women  1/2 Average Risk   3.4   3.3  Average Risk       5.0   4.4  2 X Average Risk   9.6   7.1  3 X Average Risk  23.4   11.0        Use the calculated Patient Ratio above and the CHD Risk Table to determine the patient's CHD Risk.        ATP III CLASSIFICATION (LDL):  <100     mg/dL   Optimal  100-129  mg/dL  Near or Above                    Optimal  130-159  mg/dL   Borderline  160-189  mg/dL   High  >190     mg/dL   Very High Performed at [facility]  [address] [place], [place] [address]    POC Amphetamine UR 10/04/2021 None Detected  NONE DETECTED (Cut Off Level 1000 ng/mL) Final   POC Secobarbital (BAR) 10/04/2021 None Detected  NONE DETECTED (Cut Off Level 300 ng/mL) Final   POC Buprenorphine (BUP) 10/04/2021 None Detected  NONE DETECTED (Cut Off Level 10 ng/mL) Final   POC Oxazepam (BZO) 10/04/2021 None Detected  NONE DETECTED (Cut Off Level 300 ng/mL) Final   POC Cocaine UR 10/04/2021 None Detected  NONE DETECTED (Cut Off Level 300 ng/mL) Final   POC Methamphetamine UR 10/04/2021 None Detected  NONE DETECTED (Cut Off Level 1000 ng/mL) Final   POC Morphine 10/04/2021 None Detected  NONE DETECTED (Cut Off Level 300 ng/mL) Final   POC Methadone UR 10/04/2021 None Detected  NONE DETECTED (Cut Off Level 300 ng/mL) Final   POC Oxycodone UR 10/04/2021 Positive (A)  NONE DETECTED (Cut Off Level 100 ng/mL) Final   POC Marijuana UR 10/04/2021 None Detected  NONE DETECTED (Cut Off Level 50 ng/mL) Final   SARSCOV2ONAVIRUS 2 AG 10/04/2021 NEGATIVE  NEGATIVE Final   Comment: (NOTE) SARS-CoV-2 antigen NOT DETECTED.   Negative results are presumptive.  Negative results do not preclude SARS-CoV-2 infection and should not be used as the sole basis for treatment or other patient management decisions, including infection  control decisions, particularly in the presence of clinical signs and  symptoms consistent with COVID-19, or in those who have been in contact with the virus.  Negative results must be combined with clinical observations, patient history, and epidemiological information. The expected result is Negative.  Fact Sheet for Patients: HandmadeRecipes.com.cy  Fact Sheet for Healthcare Providers: FuneralLife.at  This test is not yet approved or cleared by the [place] FDA and  has been authorized for detection and/or diagnosis of SARS-CoV-2 by FDA under an Emergency Use Authorization (EUA).   This EUA will remain in effect (meaning this test can be used) for the duration of  the COV                          ID-19 declaration under Section 564(b)(1) of the Act, 21 U.S.C. section 360bbb-3(b)(1), unless the authorization is terminated or revoked sooner.     Valproic Acid Lvl 10/04/2021 51  50.0 - 100.0 ug/mL Final   Performed at [facility] [address], [place], [place] [address]   Valproic Acid Lvl 10/08/2021 57  50.0 - 100.0 ug/mL Final   Performed at [facility] [address], [place], [place] [address]   Glucose-Capillary 10/09/2021 123 (H)  70 - 99 mg/dL Final   Glucose reference range applies only to samples taken after fasting for at least 8 hours.  Admission on 09/09/2021, Discharged on 09/10/2021  Component Date Value Ref Range Status   Sodium 09/09/2021 134 (L)  135 - 145 mmol/L Final   Potassium 09/09/2021 4.3  3.5 - 5.1 mmol/L Final   Chloride 09/09/2021 99  98 - 111 mmol/L Final   CO2 09/09/2021 25  22 - 32 mmol/L Final   Glucose, Bld 09/09/2021 107 (H)  70 - 99 mg/dL Final  Glucose reference range applies only to samples taken after fasting for at least 8 hours.   BUN 09/09/2021 12  6 - 20 mg/dL Final   Creatinine, Ser 09/09/2021 0.74  0.44 - 1.00 mg/dL Final   Calcium 09/09/2021 9.0  8.9 - 10.3 mg/dL Final   Total Protein 09/09/2021 7.4  6.5 - 8.1 g/dL Final   Albumin 09/09/2021 3.1 (L)  3.5 - 5.0 g/dL Final   AST 09/09/2021 13 (L)  15 - 41 U/L Final   ALT 09/09/2021 13  0 - 44 U/L Final   Alkaline Phosphatase 09/09/2021 66  38 - 126 U/L Final   Total Bilirubin 09/09/2021 0.2 (L)  0.3 - 1.2 mg/dL Final   GFR, Estimated 09/09/2021 >60  >60 mL/min Final   Comment: (NOTE) Calculated using the CKD-EPI Creatinine Equation (2021)    Anion gap 09/09/2021 10  5 - 15 Final   Performed at [facility], [place] [address], [place], [place] [address]   WBC 09/09/2021 8.5  4.0 - 10.5 K/uL Final   RBC 09/09/2021 4.53  3.87 - 5.11 MIL/uL Final    Hemoglobin 09/09/2021 11.8 (L)  12.0 - 15.0 g/dL Final   HCT 09/09/2021 38.0  36.0 - 46.0 % Final   MCV 09/09/2021 83.9  80.0 - 100.0 fL Final   MCH 09/09/2021 26.0  26.0 - 34.0 pg Final   MCHC 09/09/2021 31.1  30.0 - 36.0 g/dL Final   RDW 09/09/2021 17.8 (H)  11.5 - 15.5 % Final   Platelets 09/09/2021 442 (H)  150 - 400 K/uL Final   nRBC 09/09/2021 0.0  0.0 - 0.2 % Final   Neutrophils Relative % 09/09/2021 70  % Final   Neutro Abs 09/09/2021 5.9  1.7 - 7.7 K/uL Final   Lymphocytes Relative 09/09/2021 20  % Final   Lymphs Abs 09/09/2021 1.7  0.7 - 4.0 K/uL Final   Monocytes Relative 09/09/2021 8  % Final   Monocytes Absolute 09/09/2021 0.7  0.1 - 1.0 K/uL Final   Eosinophils Relative 09/09/2021 1  % Final   Eosinophils Absolute 09/09/2021 0.1  0.0 - 0.5 K/uL Final   Basophils Relative 09/09/2021 1  % Final   Basophils Absolute 09/09/2021 0.1  0.0 - 0.1 K/uL Final   Immature Granulocytes 09/09/2021 0  % Final   Abs Immature Granulocytes 09/09/2021 0.03  0.00 - 0.07 K/uL Final   Performed at [facility], [place] [address], [place], [place] [address]   Ammonia 09/09/2021 37 (H)  9 - 35 umol/L Final   Comment: HEMOLYSIS AT THIS LEVEL MAY AFFECT RESULT Performed at [facility], [place] [address], [place], [place] [address]    Opiates 09/09/2021 NONE DETECTED  NONE DETECTED Final   Cocaine 09/09/2021 NONE DETECTED  NONE DETECTED Final   Benzodiazepines 09/09/2021 NONE DETECTED  NONE DETECTED Final   Amphetamines 09/09/2021 NONE DETECTED  NONE DETECTED Final   Tetrahydrocannabinol 09/09/2021 NONE DETECTED  NONE DETECTED Final   Barbiturates 09/09/2021 NONE DETECTED  NONE DETECTED Final   Comment: (NOTE) DRUG SCREEN FOR MEDICAL PURPOSES ONLY.  IF CONFIRMATION IS NEEDED FOR ANY PURPOSE, NOTIFY LAB WITHIN 5 DAYS.  LOWEST DETECTABLE LIMITS FOR URINE DRUG SCREEN Drug Class                     Cutoff (ng/mL) Amphetamine and metabolites    1000 Barbiturate and metabolites     200 Benzodiazepine  322 Tricyclics and metabolites     300 Opiates and metabolites        300 Cocaine and metabolites        300 THC                            50 Performed at [facility], [place] [address], [place], [place] [address]    Alcohol, Ethyl (B) 09/09/2021 <10  <10 mg/dL Final   Comment: (NOTE) Lowest detectable limit for serum alcohol is 10 mg/dL.  For medical purposes only. Performed at [facility], [place] [address] [place], [place] [address]    Lipase 09/09/2021 33  11 - 51 U/L Final   Performed at [facility] [address], [place], [place] [address]   Color, Urine 09/09/2021 COLORLESS (A)  YELLOW Final   APPearance 09/09/2021 CLEAR  CLEAR Final   Specific Gravity, Urine 09/09/2021 1.002 (L)  1.005 - 1.030 Final   pH 09/09/2021 6.0  5.0 - 8.0 Final   Glucose, UA 09/09/2021 NEGATIVE  NEGATIVE mg/dL Final   Hgb urine dipstick 09/09/2021 NEGATIVE  NEGATIVE Final   Bilirubin Urine 09/09/2021 NEGATIVE  NEGATIVE Final   Ketones, ur 09/09/2021 NEGATIVE  NEGATIVE mg/dL Final   Protein, ur 09/09/2021 NEGATIVE  NEGATIVE mg/dL Final   Nitrite 09/09/2021 NEGATIVE  NEGATIVE Final   Leukocytes,Ua 09/09/2021 NEGATIVE  NEGATIVE Final   Performed at [facility] [address], [place], [place] [address]   Specimen Description 09/09/2021 URINE, CLEAN CATCH   Final   Special Requests 09/09/2021 NONE   Final   Culture 09/09/2021  (A)   Final                   Value:<10,000 COLONIES/mL INSIGNIFICANT GROWTH Performed at [facility], [place] [address], [place], [place] [address]    Report Status 09/09/2021 09/10/2021 FINAL   Final   D-Dimer, Quant 09/09/2021 0.92 (H)  0.00 - 0.50 ug/mL-FEU Final   Comment: (NOTE) At the manufacturer cut-off value of 0.5 g/mL FEU, this assay has a negative predictive value of 95-100%.This assay is intended for use in conjunction with a clinical pretest probability (PTP) assessment model to exclude  pulmonary embolism (PE) and deep venous thrombosis (DVT) in outpatients suspected of PE or DVT. Results should be correlated with clinical presentation. Performed at [facility], [place] [address], [place], [place] [address]    Troponin I (High Sensitivity) 09/09/2021 5  <18 ng/L Final   Comment: (NOTE) Elevated high sensitivity troponin I (hsTnI) values and significant  changes across serial measurements may suggest ACS but many other  chronic and acute conditions are known to elevate hsTnI results.  Refer to the "Links" section for chest pain algorithms and additional  guidance. Performed at [facility], [place] [address], [place], [place] [address]    BP 09/10/2021 135/83  mmHg Final   S' Lateral 09/10/2021 2.30  cm Final   Area-P 1/2 09/10/2021 5.58  cm2 Final    Blood Alcohol level:  Lab Results  Component Value Date   ETH <10 11/23/2021   ETH <10 46/27/0350    Metabolic Disorder Labs: Lab Results  Component Value Date   HGBA1C 6.7 (H) 12/28/2021   MPG 145.59 12/28/2021   MPG 154.2 10/04/2021   Lab Results  Component Value Date   PROLACTIN 3.8 (L) 11/23/2021   Lab Results  Component  Value Date   CHOL 171 11/23/2021   TRIG 125 11/23/2021   HDL 65 11/23/2021   CHOLHDL 2.6 11/23/2021   VLDL 25 11/23/2021   LDLCALC 81 11/23/2021   LDLCALC 95 10/04/2021    Therapeutic Lab Levels: No results found for: "LITHIUM" Lab Results  Component Value Date   VALPROATE 33 (L) 12/28/2021   VALPROATE 54 12/03/2021   No results found for: "CBMZ"  Physical Findings   PHQ2-9    [facility] from 11/23/2021 in [facility]  PHQ-2 Total Score 2  PHQ-9 Total Score 3      Flowsheet Row ED from 11/23/2021 in [facility] Most recent reading at 11/23/2021  6:44 PM ED from 11/22/2021 in [place] [facility] Most recent reading at 11/22/2021  8:28 PM ED from 11/22/2021 in [place] [facility] Most recent reading at 11/22/2021  1:24 AM  C-SSRS RISK CATEGORY No Risk No Risk No Risk        Musculoskeletal  Strength & Muscle Tone: within normal limits Gait & Station: normal Patient leans: N/A  Psychiatric Specialty Exam  Presentation  General Appearance:  Appropriate for Environment; Casual  Eye Contact: Good  Speech: Clear and Coherent; Normal Rate  Speech Volume: Normal  Handedness: Right   Mood and Affect  Mood: Euthymic  Affect: Flat   Thought Process  Thought Processes: Coherent  Descriptions of Associations:Intact  Orientation:Full (Time, Place and Person)  Thought Content:Logical  Diagnosis of Schizophrenia or Schizoaffective disorder in past: Yes  Duration of Psychotic Symptoms: No data recorded  Hallucinations:Hallucinations: None  Ideas of Reference:None  Suicidal Thoughts:Suicidal Thoughts: No  Homicidal Thoughts:Homicidal Thoughts: No   Sensorium  Memory: Immediate Fair  Judgment: Intact  Insight: Present   Executive Functions  Concentration: Fair  Attention Span: Fair  Recall: [employer] of Knowledge: Fair  Language: Fair   Psychomotor Activity  Psychomotor Activity: Psychomotor Activity: Normal   Assets  Assets: Communication Skills; Desire for Improvement; Financial Resources/Insurance   Sleep  Sleep: Sleep: Good   No data recorded  Physical Exam  Physical Exam Constitutional:      General: She is not in acute distress.    Appearance: She is not ill-appearing, toxic-appearing or diaphoretic.  Eyes:     General: No scleral icterus. Cardiovascular:     Rate and Rhythm: Tachycardia present.  Pulmonary:     Effort: Pulmonary effort is normal. No respiratory distress.  Neurological:     Mental Status: She is alert and oriented to person, place, and time.  Psychiatric:        Attention and Perception: Attention and perception normal.        Mood and  Affect: Mood normal. Affect is flat.        Speech: Speech normal.        Behavior: Behavior normal. Behavior is cooperative.        Thought Content: Thought content normal.    Review of Systems  Constitutional:  Negative for chills and fever.  Respiratory:  Negative for shortness of breath.   Cardiovascular:  Negative for chest pain and palpitations.  Gastrointestinal:  Negative for abdominal pain.  Neurological:  Negative for headaches.   Blood pressure 125/65, pulse (!) 101, temperature 98 F (36.7 C), temperature source Oral, resp. rate 18, SpO2 100 %. There is no height or weight on file to calculate BMI.  Treatment Plan Summary: Plan Pt remains psychiatrically cleared. Disposition pending  placement.  Tharon Aquas, NP 01/15/2022 9:28 AM

## 2022-01-15 NOTE — ED Notes (Signed)
Snack given.

## 2022-01-16 DIAGNOSIS — F209 Schizophrenia, unspecified: Secondary | ICD-10-CM | POA: Diagnosis not present

## 2022-01-16 LAB — GLUCOSE, CAPILLARY
Glucose-Capillary: 103 mg/dL — ABNORMAL HIGH (ref 70–99)
Glucose-Capillary: 154 mg/dL — ABNORMAL HIGH (ref 70–99)
Glucose-Capillary: 228 mg/dL — ABNORMAL HIGH (ref 70–99)

## 2022-01-16 NOTE — ED Notes (Signed)
Patient sleeping - no sxs of distress noted - will continue to monitor for safety 

## 2022-01-16 NOTE — ED Notes (Signed)
Pt has been pleasant today. Given breakfast and lunch and a snack, denies SI/HI/AVH.

## 2022-01-16 NOTE — ED Notes (Signed)
Patient sleeping at this time. Breathing even and unlabored. No s/s of distress noted. Will continue to monitor for safety. 

## 2022-01-16 NOTE — ED Provider Notes (Signed)
Behavioral Health Progress Note  Date and Time: 01/16/2022 9:11 AM Name: Teresa Murillo MRN:  [id number]    Per H&P, Teresa Murillo is a 61 y.o. female with a past psychiatric history of schizophrenia, aggressive behavior, and possible intellectual disability presenting to [facility] on 11/23/21 voluntarily as a walk in via [facility] with complaints that she was locked out of the group home. Patient has been dismissed from group home due to multiple elopements. Pt had already been discharged from this facility but had been living there temporarily while DSS was looking for new placement. Pt is boarding at [facility].   Subjective:  Patient seen and evaluated face to face by this provider and chart reviewed. On evaluation, patient is alert and oriented x 4. Her thought process is logical and speech is coherent. She does not appear to be in acute distress. Her mood is euthymic and affect is congruent. She denies SI/HI/AVH. There is no objective evidence that she is currently responding to internal or external stimuli. She denies depressive symptoms. She reports fair sleep. She reports a good appetite. He is medication compliant and denies medication side effects. No n/v, muscle spasms, involuntary movements, SOB or chest pain. She continues to engage on the unit. She asked if she can have a [employer]. She was advised by nursing that we have the New Testament bible here at the [facility].   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 On the Wechsler Adult Intelligence Scale-4, Ms.  Murillo achieved a full-scale IQ score of 73 and a percentile rank of 4 placing her in the borderline range of intellectual functioning.    Chronic obstructive pulmonary disease (COPD) (HCC)    Glaucoma    Hyperlipidemia    Hypertension    Iron deficiency    Schizoaffective disorder (HCC)    Type 2 diabetes mellitus (HCC)     Past Surgical History:  Procedure Laterality Date   TUBAL LIGATION     Family History:  Family History  Problem Relation Age of Onset   Breast cancer Maternal Grandmother    Family Psychiatric  History: No history reported.  Social History:  Social History   Substance and Sexual Activity  Alcohol Use Yes     Social History   Substance and Sexual Activity  Drug Use Not Currently    Social History   Socioeconomic History   Marital status: Divorced    Spouse name: Not on file   Number of children: Not on file   Years of education: Not on file   Highest education level: Not on file  Occupational History   Not on file  Tobacco Use   Smoking status: Every Day    Packs/day: 1.00    Types: Cigars, Cigarettes   Smokeless tobacco: Current  Vaping Use   Vaping Use: Never used  Substance and Sexual Activity   Alcohol use: Yes   Drug use: Not Currently   Sexual activity: Not Currently    Birth control/protection: Surgical  Other Topics Concern   Not on file  Social History Narrative   Not on file   Social Determinants of Health  Financial Resource Strain: Not on file  Food Insecurity: Not on file  Transportation Needs: Not on file  Physical Activity: Not on file  Stress: Not on file  Social Connections: Not on file   SDOH:  SDOH Screenings   Depression (PHQ2-9): Low Risk  (11/23/2021)  Tobacco Use: High Risk (12/30/2021)   Current Medications:  Current Facility-Administered Medications  Medication Dose Route Frequency Provider Last Rate Last Admin   acetaminophen (TYLENOL) tablet 650 mg  650 mg Oral Q6H PRN Rankin, Shuvon B, NP    650 mg at 01/15/22 0037   albuterol (VENTOLIN HFA) 108 (90 Base) MCG/ACT inhaler 2 puff  2 puff Inhalation Q6H PRN Rankin, Shuvon B, NP   2 puff at 12/09/21 2102   alum & mag hydroxide-simeth (MAALOX/MYLANTA) 200-200-20 MG/5ML suspension 30 mL  30 mL Oral Q4H PRN Rankin, Shuvon B, NP   30 mL at 12/19/21 2109   apixaban (ELIQUIS) tablet 5 mg  5 mg Oral BID Rankin, Shuvon B, NP   5 mg at 01/15/22 2132   cloZAPine (CLOZARIL) tablet 50 mg  50 mg Oral Daily [name], NP   50 mg at 01/15/22 0910   cloZAPine (CLOZARIL) tablet 75 mg  75 mg Oral Dorie Rank, NP   75 mg at 01/15/22 2129   diltiazem (CARDIZEM CD) 24 hr capsule 240 mg  240 mg Oral Daily Rankin, Shuvon B, NP   240 mg at 01/15/22 0911   divalproex (DEPAKOTE ER) 24 hr tablet 500 mg  500 mg Oral BID Rankin, Shuvon B, NP   500 mg at 01/15/22 2128   fluticasone furoate-vilanterol (BREO ELLIPTA) 200-25 MCG/ACT 1 puff  1 puff Inhalation Daily Rankin, Shuvon B, NP   1 puff at 01/16/22 0759   haloperidol (HALDOL) tablet 10 mg  10 mg Oral Q8H PRN Rankin, Shuvon B, NP       insulin aspart (novoLOG) injection 0-9 Units  0-9 Units Subcutaneous TID WC Tharon Aquas, NP   1 Units at 01/15/22 1754   insulin glargine-yfgn (SEMGLEE) injection 8 Units  8 Units Subcutaneous Daily Tharon Aquas, NP   8 Units at 01/15/22 1023   latanoprost (XALATAN) 0.005 % ophthalmic solution 1 drop  1 drop Both Eyes QHS Rankin, Shuvon B, NP   1 drop at 01/15/22 2132   magnesium hydroxide (MILK OF MAGNESIA) suspension 30 mL  30 mL Oral Daily PRN Rankin, Shuvon B, NP       menthol-cetylpyridinium (CEPACOL) lozenge 3 mg  1 lozenge Oral PRN [name], NP   3 mg at 01/13/22 1601   metFORMIN (GLUCOPHAGE-XR) 24 hr tablet 1,000 mg  1,000 mg Oral BID WC [name], DO   1,000 mg at 01/16/22 0759   multivitamins with iron tablet 1 tablet  1 tablet Oral Daily [name], DO   1 tablet at 01/15/22 0910   nicotine (NICODERM CQ - dosed in mg/24 hr) patch 7 mg  7  mg Transdermal Daily Rankin, Shuvon B, NP   7 mg at 01/15/22 0912   polyethylene glycol (MIRALAX / GLYCOLAX) packet 17 g  17 g Oral Daily [name], DO   17 g at 01/15/22 0914   Vitamin D (Ergocalciferol) (DRISDOL) 1.25 MG (50000 UNIT) capsule 50,000 Units  50,000 Units Oral Q7 days Rankin, Shuvon B, NP   50,000 Units at 01/14/22 1007   Current Outpatient Medications  Medication Sig Dispense Refill   Accu-Chek Softclix Lancets lancets Use as directed up to four times  daily 100 each 0   apixaban (ELIQUIS) 5 MG TABS tablet Take 1 tablet (5 mg total) by mouth 2 (two) times daily. 60 tablet 0   ARIPiprazole (ABILIFY) 10 MG tablet Take 1 tablet (10 mg total) by mouth daily. 30 tablet 0   Blood Glucose Monitoring Suppl (ACCU-CHEK GUIDE) w/Device KIT Use as directed up to four times daily 1 kit 0   budesonide-formoterol (SYMBICORT) 160-4.5 MCG/ACT inhaler Inhale 2 puffs into the lungs in the morning and at bedtime.     Cholecalciferol (VITAMIN D3) 1.25 MG (50000 UT) CAPS Take 50,000 Units by mouth every Thursday.     cloZAPine (CLOZARIL) 25 MG tablet Take 3 tablets (75 mg total) by mouth at bedtime. 90 tablet 0   clozapine (CLOZARIL) 50 MG tablet Take 1 tablet (50 mg total) by mouth daily. 30 tablet 0   diltiazem (CARDIZEM CD) 240 MG 24 hr capsule Take 1 capsule (240 mg total) by mouth daily. (Patient not taking: Reported on 11/24/2021) 30 capsule 0   divalproex (DEPAKOTE ER) 500 MG 24 hr tablet Take 1 tablet (500 mg total) by mouth 2 (two) times daily. 60 tablet 0   glucose blood test strip Use as directed up to four times daily 50 each 0   haloperidol (HALDOL) 10 MG tablet Take 1 tablet (10 mg total) by mouth 3 (three) times daily as needed for agitation (and psychotic symptoms).     INGREZZA 40 MG capsule Take 1 capsule (40 mg total) by mouth in the morning. 30 capsule 0   insulin aspart (NOVOLOG) 100 UNIT/ML FlexPen Before each meal 3 times a day, 140-199 - 2 units, 200-250 - 4 units, 251-299 - 6  units,  300-349 - 8 units,  350 or above 10 units. Insulin PEN if approved, provide syringes and needles if needed.Please switch to any approved short acting Insulin if needed. 15 mL 0   insulin glargine (LANTUS) 100 UNIT/ML Solostar Pen Inject 12 Units into the skin daily. 15 mL 0   Insulin Pen Needle 32G X 4 MM MISC Use 4 times a day with insulin, 1 month supply. 100 each 0   latanoprost (XALATAN) 0.005 % ophthalmic solution Place 1 drop into both eyes at bedtime.     metFORMIN (GLUCOPHAGE) 500 MG tablet Take 500 mg by mouth 2 (two) times daily with a meal.     nicotine (NICODERM CQ - DOSED IN MG/24 HR) 7 mg/24hr patch Place 1 patch (7 mg total) onto the skin daily. 28 patch 0   PROAIR HFA 108 (90 Base) MCG/ACT inhaler Inhale 2 puffs into the lungs every 6 (six) hours as needed for wheezing or shortness of breath.      Labs  Lab Results:  No results displayed because visit has over 200 results.    Admission on 11/22/2021, Discharged on 11/22/2021  Component Date Value Ref Range Status   Glucose-Capillary 11/22/2021 169 (H)  70 - 99 mg/dL Final   Glucose reference range applies only to samples taken after fasting for at least 8 hours.  No results displayed because visit has over 200 results.    Admission on 10/04/2021, Discharged on 10/22/2021  Component Date Value Ref Range Status   SARS Coronavirus 2 by RT PCR 10/04/2021 NEGATIVE  NEGATIVE Final   Comment: (NOTE) SARS-CoV-2 target nucleic acids are NOT DETECTED.  The SARS-CoV-2 RNA is generally detectable in upper respiratory specimens during the acute phase of infection. The lowest concentration of SARS-CoV-2 viral copies this assay can  detect is 138 copies/mL. A negative result does not preclude SARS-Cov-2 infection and should not be used as the sole basis for treatment or other patient management decisions. A negative result may occur with  improper specimen collection/handling, submission of specimen other than nasopharyngeal  swab, presence of viral mutation(s) within the areas targeted by this assay, and inadequate number of viral copies(<138 copies/mL). A negative result must be combined with clinical observations, patient history, and epidemiological information. The expected result is Negative.  Fact Sheet for Patients:  EntrepreneurPulse.com.au  Fact Sheet for Healthcare Providers:  IncredibleEmployment.be  This test is no                          t yet approved or cleared by the [place] FDA and  has been authorized for detection and/or diagnosis of SARS-CoV-2 by FDA under an Emergency Use Authorization (EUA). This EUA will remain  in effect (meaning this test can be used) for the duration of the COVID-19 declaration under Section 564(b)(1) of the Act, 21 U.S.C.section 360bbb-3(b)(1), unless the authorization is terminated  or revoked sooner.       Influenza A by PCR 10/04/2021 NEGATIVE  NEGATIVE Final   Influenza B by PCR 10/04/2021 NEGATIVE  NEGATIVE Final   Comment: (NOTE) The Xpert Xpress SARS-CoV-2/FLU/RSV plus assay is intended as an aid in the diagnosis of influenza from Nasopharyngeal swab specimens and should not be used as a sole basis for treatment. Nasal washings and aspirates are unacceptable for Xpert Xpress SARS-CoV-2/FLU/RSV testing.  Fact Sheet for Patients: EntrepreneurPulse.com.au  Fact Sheet for Healthcare Providers: IncredibleEmployment.be  This test is not yet approved or cleared by the [place] FDA and has been authorized for detection and/or diagnosis of SARS-CoV-2 by FDA under an Emergency Use Authorization (EUA). This EUA will remain in effect (meaning this test can be used) for the duration of the COVID-19 declaration under Section 564(b)(1) of the Act, 21 U.S.C. section 360bbb-3(b)(1), unless the authorization is terminated or revoked.  Performed at [facility], [place] [address] [place], [place] [address]    WBC 10/04/2021 8.4  4.0 - 10.5 K/uL Final   RBC 10/04/2021 4.42  3.87 - 5.11 MIL/uL Final   Hemoglobin 10/04/2021 11.6 (L)  12.0 - 15.0 g/dL Final   HCT 10/04/2021 35.7 (L)  36.0 - 46.0 % Final   MCV 10/04/2021 80.8  80.0 - 100.0 fL Final   MCH 10/04/2021 26.2  26.0 - 34.0 pg Final   MCHC 10/04/2021 32.5  30.0 - 36.0 g/dL Final   RDW 10/04/2021 16.0 (H)  11.5 - 15.5 % Final   Platelets 10/04/2021 372  150 - 400 K/uL Final   nRBC 10/04/2021 0.0  0.0 - 0.2 % Final   Neutrophils Relative % 10/04/2021 68  % Final   Neutro Abs 10/04/2021 5.7  1.7 - 7.7 K/uL Final   Lymphocytes Relative 10/04/2021 22  % Final   Lymphs Abs 10/04/2021 1.8  0.7 - 4.0 K/uL Final   Monocytes Relative 10/04/2021 8  % Final   Monocytes Absolute 10/04/2021 0.7  0.1 - 1.0 K/uL Final   Eosinophils Relative 10/04/2021 1  % Final   Eosinophils Absolute 10/04/2021 0.1  0.0 - 0.5 K/uL Final   Basophils Relative 10/04/2021 1  % Final   Basophils Absolute 10/04/2021 0.1  0.0 - 0.1 K/uL Final   Immature Granulocytes 10/04/2021 0  % Final   Abs Immature Granulocytes 10/04/2021 0.03  0.00 - 0.07 K/uL Final   Performed at [facility], [place] [address], [place], [place] [address]   Sodium 10/04/2021 136  135 - 145 mmol/L Final   Potassium 10/04/2021 4.2  3.5 - 5.1 mmol/L Final   Chloride 10/04/2021 104  98 - 111 mmol/L Final   CO2 10/04/2021 25  22 - 32 mmol/L Final   Glucose, Bld 10/04/2021 100 (H)  70 - 99 mg/dL Final   Glucose reference range applies only to samples taken after fasting for at least 8 hours.   BUN 10/04/2021 9  6 - 20 mg/dL Final   Creatinine, Ser 10/04/2021 0.52  0.44 - 1.00 mg/dL Final   Calcium 10/04/2021 9.0  8.9 - 10.3 mg/dL Final   Total Protein 10/04/2021 7.0  6.5 - 8.1 g/dL Final   Albumin 10/04/2021 3.2 (L)  3.5 - 5.0 g/dL Final   AST 10/04/2021 13 (L)  15 - 41 U/L Final   ALT 10/04/2021 10  0 - 44 U/L Final   Alkaline Phosphatase 10/04/2021 61  38 -  126 U/L Final   Total Bilirubin 10/04/2021 0.3  0.3 - 1.2 mg/dL Final   GFR, Estimated 10/04/2021 >60  >60 mL/min Final   Comment: (NOTE) Calculated using the CKD-EPI Creatinine Equation (2021)    Anion gap 10/04/2021 7  5 - 15 Final   Performed at [facility] [address], [place], [place] [address]   Hgb A1c MFr Bld 10/04/2021 7.0 (H)  4.8 - 5.6 % Final   Comment: (NOTE) Pre diabetes:          5.7%-6.4%  Diabetes:              >6.4%  Glycemic control for   <7.0% adults with diabetes    Mean Plasma Glucose 10/04/2021 154.2  mg/dL Final   Performed at [facility], [place] [address] [place], [place] [address]   Cholesterol 10/04/2021 178  0 - 200 mg/dL Final   Triglycerides 10/04/2021 155 (H)  <150 mg/dL Final   HDL 10/04/2021 52  >40 mg/dL Final   Total CHOL/HDL Ratio 10/04/2021 3.4  RATIO Final   VLDL 10/04/2021 31  0 - 40 mg/dL Final   LDL Cholesterol 10/04/2021 95  0 - 99 mg/dL Final   Comment:        Total Cholesterol/HDL:CHD Risk Coronary Heart Disease Risk Table                     Men   Women  1/2 Average Risk   3.4   3.3  Average Risk       5.0   4.4  2 X Average Risk   9.6   7.1  3 X Average Risk  23.4   11.0        Use the calculated Patient Ratio above and the CHD Risk Table to determine the patient's CHD Risk.        ATP III CLASSIFICATION (LDL):  <100     mg/dL   Optimal  100-129  mg/dL   Near or Above                    Optimal  130-159  mg/dL   Borderline  160-189  mg/dL   High  >190     mg/dL   Very High Performed at [facility] [address], [place], [place] [address]    POC Amphetamine UR 10/04/2021 None  Detected  NONE DETECTED (Cut Off Level 1000 ng/mL) Final   POC Secobarbital (BAR) 10/04/2021 None Detected  NONE DETECTED (Cut Off Level 300 ng/mL) Final   POC Buprenorphine (BUP) 10/04/2021 None Detected  NONE DETECTED (Cut Off Level 10 ng/mL) Final   POC Oxazepam (BZO) 10/04/2021 None Detected  NONE DETECTED (Cut Off Level  300 ng/mL) Final   POC Cocaine UR 10/04/2021 None Detected  NONE DETECTED (Cut Off Level 300 ng/mL) Final   POC Methamphetamine UR 10/04/2021 None Detected  NONE DETECTED (Cut Off Level 1000 ng/mL) Final   POC Morphine 10/04/2021 None Detected  NONE DETECTED (Cut Off Level 300 ng/mL) Final   POC Methadone UR 10/04/2021 None Detected  NONE DETECTED (Cut Off Level 300 ng/mL) Final   POC Oxycodone UR 10/04/2021 Positive (A)  NONE DETECTED (Cut Off Level 100 ng/mL) Final   POC Marijuana UR 10/04/2021 None Detected  NONE DETECTED (Cut Off Level 50 ng/mL) Final   SARSCOV2ONAVIRUS 2 AG 10/04/2021 NEGATIVE  NEGATIVE Final   Comment: (NOTE) SARS-CoV-2 antigen NOT DETECTED.   Negative results are presumptive.  Negative results do not preclude SARS-CoV-2 infection and should not be used as the sole basis for treatment or other patient management decisions, including infection  control decisions, particularly in the presence of clinical signs and  symptoms consistent with COVID-19, or in those who have been in contact with the virus.  Negative results must be combined with clinical observations, patient history, and epidemiological information. The expected result is Negative.  Fact Sheet for Patients: HandmadeRecipes.com.cy  Fact Sheet for Healthcare Providers: FuneralLife.at  This test is not yet approved or cleared by the [place] FDA and  has been authorized for detection and/or diagnosis of SARS-CoV-2 by FDA under an Emergency Use Authorization (EUA).  This EUA will remain in effect (meaning this test can be used) for the duration of  the COV                          ID-19 declaration under Section 564(b)(1) of the Act, 21 U.S.C. section 360bbb-3(b)(1), unless the authorization is terminated or revoked sooner.     Valproic Acid Lvl 10/04/2021 51  50.0 - 100.0 ug/mL Final   Performed at [facility] [address]  [place], [place] [address]   Valproic Acid Lvl 10/08/2021 57  50.0 - 100.0 ug/mL Final   Performed at [facility] [address] [place], [place] [address]   Glucose-Capillary 10/09/2021 123 (H)  70 - 99 mg/dL Final   Glucose reference range applies only to samples taken after fasting for at least 8 hours.  Admission on 09/09/2021, Discharged on 09/10/2021  Component Date Value Ref Range Status   Sodium 09/09/2021 134 (L)  135 - 145 mmol/L Final   Potassium 09/09/2021 4.3  3.5 - 5.1 mmol/L Final   Chloride 09/09/2021 99  98 - 111 mmol/L Final   CO2 09/09/2021 25  22 - 32 mmol/L Final   Glucose, Bld 09/09/2021 107 (H)  70 - 99 mg/dL Final   Glucose reference range applies only to samples taken after fasting for at least 8 hours.   BUN 09/09/2021 12  6 - 20 mg/dL Final   Creatinine, Ser 09/09/2021 0.74  0.44 - 1.00 mg/dL Final   Calcium 09/09/2021 9.0  8.9 - 10.3 mg/dL Final   Total Protein 09/09/2021 7.4  6.5 - 8.1 g/dL Final   Albumin 09/09/2021 3.1 (L)  3.5 - 5.0 g/dL Final   AST 09/09/2021 13 (L)  15 - 41 U/L Final   ALT 09/09/2021 13  0 - 44 U/L Final   Alkaline Phosphatase 09/09/2021 66  38 - 126 U/L Final   Total Bilirubin 09/09/2021 0.2 (L)  0.3 - 1.2 mg/dL Final   GFR, Estimated 09/09/2021 >60  >60 mL/min Final   Comment: (NOTE) Calculated using the CKD-EPI Creatinine Equation (2021)    Anion gap 09/09/2021 10  5 - 15 Final   Performed at [facility], [place] [address], [place], [place] [address]   WBC 09/09/2021 8.5  4.0 - 10.5 K/uL Final   RBC 09/09/2021 4.53  3.87 - 5.11 MIL/uL Final   Hemoglobin 09/09/2021 11.8 (L)  12.0 - 15.0 g/dL Final   HCT 09/09/2021 38.0  36.0 - 46.0 % Final   MCV 09/09/2021 83.9  80.0 - 100.0 fL Final   MCH 09/09/2021 26.0  26.0 - 34.0 pg Final   MCHC 09/09/2021 31.1  30.0 - 36.0 g/dL Final   RDW 09/09/2021 17.8 (H)  11.5 - 15.5 % Final   Platelets 09/09/2021 442 (H)  150 - 400 K/uL Final   nRBC 09/09/2021 0.0  0.0 - 0.2 % Final    Neutrophils Relative % 09/09/2021 70  % Final   Neutro Abs 09/09/2021 5.9  1.7 - 7.7 K/uL Final   Lymphocytes Relative 09/09/2021 20  % Final   Lymphs Abs 09/09/2021 1.7  0.7 - 4.0 K/uL Final   Monocytes Relative 09/09/2021 8  % Final   Monocytes Absolute 09/09/2021 0.7  0.1 - 1.0 K/uL Final   Eosinophils Relative 09/09/2021 1  % Final   Eosinophils Absolute 09/09/2021 0.1  0.0 - 0.5 K/uL Final   Basophils Relative 09/09/2021 1  % Final   Basophils Absolute 09/09/2021 0.1  0.0 - 0.1 K/uL Final   Immature Granulocytes 09/09/2021 0  % Final   Abs Immature Granulocytes 09/09/2021 0.03  0.00 - 0.07 K/uL Final   Performed at [facility], [place] [address] [place], [place] [address]   Ammonia 09/09/2021 37 (H)  9 - 35 umol/L Final   Comment: HEMOLYSIS AT THIS LEVEL MAY AFFECT RESULT Performed at [facility], [place] [address], [place],  [address]    Opiates 09/09/2021 NONE DETECTED  NONE DETECTED Final   Cocaine 09/09/2021 NONE DETECTED  NONE DETECTED Final   Benzodiazepines 09/09/2021 NONE DETECTED  NONE DETECTED Final   Amphetamines 09/09/2021 NONE DETECTED  NONE DETECTED Final   Tetrahydrocannabinol 09/09/2021 NONE DETECTED  NONE DETECTED Final   Barbiturates 09/09/2021 NONE DETECTED  NONE DETECTED Final   Comment: (NOTE) DRUG SCREEN FOR MEDICAL PURPOSES ONLY.  IF CONFIRMATION IS NEEDED FOR ANY PURPOSE, NOTIFY LAB WITHIN 5 DAYS.  LOWEST DETECTABLE LIMITS FOR URINE DRUG SCREEN Drug Class                     Cutoff (ng/mL) Amphetamine and metabolites    1000 Barbiturate and metabolites    200 Benzodiazepine                 465 Tricyclics and metabolites     300 Opiates and metabolites        300 Cocaine and metabolites        300 THC                            50  Performed at [facility], [place] [address], [place], [place] [address]    Alcohol, Ethyl (B) 09/09/2021 <10  <10 mg/dL Final   Comment: (NOTE) Lowest detectable limit for serum alcohol is 10  mg/dL.  For medical purposes only. Performed at [facility], [place] [address] [place], [place] [address]    Lipase 09/09/2021 33  11 - 51 U/L Final   Performed at [facility] [address], [place], [place] [address]   Color, Urine 09/09/2021 COLORLESS (A)  YELLOW Final   APPearance 09/09/2021 CLEAR  CLEAR Final   Specific Gravity, Urine 09/09/2021 1.002 (L)  1.005 - 1.030 Final   pH 09/09/2021 6.0  5.0 - 8.0 Final   Glucose, UA 09/09/2021 NEGATIVE  NEGATIVE mg/dL Final   Hgb urine dipstick 09/09/2021 NEGATIVE  NEGATIVE Final   Bilirubin Urine 09/09/2021 NEGATIVE  NEGATIVE Final   Ketones, ur 09/09/2021 NEGATIVE  NEGATIVE mg/dL Final   Protein, ur 09/09/2021 NEGATIVE  NEGATIVE mg/dL Final   Nitrite 09/09/2021 NEGATIVE  NEGATIVE Final   Leukocytes,Ua 09/09/2021 NEGATIVE  NEGATIVE Final   Performed at [facility] [address] [place], [place] [address]   Specimen Description 09/09/2021 URINE, CLEAN CATCH   Final   Special Requests 09/09/2021 NONE   Final   Culture 09/09/2021  (A)   Final                   Value:<10,000 COLONIES/mL INSIGNIFICANT GROWTH Performed at [facility], [place] [address], [place], [place] [address]    Report Status 09/09/2021 09/10/2021 FINAL   Final   D-Dimer, Quant 09/09/2021 0.92 (H)  0.00 - 0.50 ug/mL-FEU Final   Comment: (NOTE) At the manufacturer cut-off value of 0.5 g/mL FEU, this assay has a negative predictive value of 95-100%.This assay is intended for use in conjunction with a clinical pretest probability (PTP) assessment model to exclude pulmonary embolism (PE) and deep venous thrombosis (DVT) in outpatients suspected of PE or DVT. Results should be correlated with clinical presentation. Performed at [facility], [place] [address] [place], [place] [address]    Troponin I (High Sensitivity) 09/09/2021 5  <18 ng/L Final   Comment: (NOTE) Elevated high sensitivity troponin I (hsTnI) values and significant   changes across serial measurements may suggest ACS but many other  chronic and acute conditions are known to elevate hsTnI results.  Refer to the "Links" section for chest pain algorithms and additional  guidance. Performed at [facility], [place] [address] [place], [place] [address]    BP 09/10/2021 135/83  mmHg Final   S' Lateral 09/10/2021 2.30  cm Final   Area-P 1/2 09/10/2021 5.58  cm2 Final    Blood Alcohol level:  Lab Results  Component Value Date   ETH <10 11/23/2021   ETH <10 54/65/6812    Metabolic Disorder Labs: Lab Results  Component Value Date   HGBA1C 6.7 (H) 12/28/2021   MPG 145.59 12/28/2021   MPG 154.2 10/04/2021   Lab Results  Component Value Date   PROLACTIN 3.8 (L) 11/23/2021   Lab Results  Component Value Date   CHOL 171 11/23/2021   TRIG 125 11/23/2021   HDL 65 11/23/2021   CHOLHDL 2.6 11/23/2021   VLDL 25 11/23/2021   LDLCALC 81 11/23/2021   LDLCALC 95 10/04/2021    Therapeutic Lab Levels: No results found for: "LITHIUM" Lab Results  Component Value Date   VALPROATE 33 (L)  12/28/2021   VALPROATE 54 12/03/2021   No results found for: "CBMZ"  Physical Findings   PHQ2-9    [place] ED from 11/23/2021 in [facility]  PHQ-2 Total Score 2  PHQ-9 Total Score 3      Flowsheet Row ED from 11/23/2021 in [facility] Most recent reading at 11/23/2021  6:44 PM ED from 11/22/2021 in [place] [facility] Most recent reading at 11/22/2021  8:28 PM ED from 11/22/2021 in [place] [facility] Most recent reading at 11/22/2021  1:24 AM  C-SSRS RISK CATEGORY No Risk No Risk No Risk        Musculoskeletal  Strength & Muscle Tone: within normal limits Gait & Station: normal Patient leans: N/A  Psychiatric Specialty Exam  Presentation  General Appearance:  Appropriate for Environment  Eye Contact: Fair  Speech: Clear and  Coherent  Speech Volume: Normal  Handedness: Right   Mood and Affect  Mood: Euthymic  Affect: Congruent   Thought Process  Thought Processes: Coherent  Descriptions of Associations:Intact  Orientation:Full (Time, Place and Person)  Thought Content:Logical  Diagnosis of Schizophrenia or Schizoaffective disorder in past: Yes  Duration of Psychotic Symptoms: No data recorded  Hallucinations:Hallucinations: None  Ideas of Reference:None  Suicidal Thoughts:Suicidal Thoughts: No  Homicidal Thoughts:Homicidal Thoughts: No   Sensorium  Memory: Immediate Fair; Recent Fair; Remote Fair  Judgment: Fair  Insight: Fair   Community education officer  Concentration: Fair  Attention Span: Fair  Recall: [employer] of Knowledge: Fair  Language: Fair   Psychomotor Activity  Psychomotor Activity: Psychomotor Activity: Normal   Assets  Assets: Communication Skills; Financial Resources/Insurance; Desire for Improvement   Sleep  Sleep: Sleep: Fair Number of Hours of Sleep: 8   No data recorded  Physical Exam  Physical Exam HENT:     Head: Normocephalic.     Nose: Nose normal.  Eyes:     Conjunctiva/sclera: Conjunctivae normal.  Cardiovascular:     Rate and Rhythm: Normal rate.  Pulmonary:     Effort: Pulmonary effort is normal.  Musculoskeletal:        General: Normal range of motion.     Cervical back: Normal range of motion.  Neurological:     Mental Status: She is alert and oriented to person, place, and time.    Review of Systems  Constitutional: Negative.   HENT: Negative.    Eyes: Negative.   Respiratory: Negative.    Cardiovascular: Negative.   Neurological: Negative.   Endo/Heme/Allergies: Negative.    Blood pressure 127/81, pulse 91, temperature 98.2 F (36.8 C), temperature source Oral, resp. rate 18, SpO2 99 %. There is no height or weight on file to calculate BMI.  Treatment Plan Summary: Patient has been psychiatrically  cleared and does not meet criteria for inpatient psychiatric treatment. She is unable to return to group home unless she has been assigned a guardian. She is unable to care for herself and has been boarding on the continuous assessment unit while awaiting DSS caseworker to find appropriate placement. Social work/TOC working with DSS to find placement. Per Boris Lown, LCSW, Per Kevin Fenton, [facility] social work supervisor, their attorney determined the patient has the capacity to make placement decisions and they will not be seeking guardianship.    Per chart review, AID to Capacity Evaluation (ACE) completed by Dr. Louis Meckel, Dr. Dwyane Dee on 12/11/2021.  Please see media tab for full details.    [name], PhD  completed: Wechsler Adult Intelligence Scale-4, Ms. 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  Current Medication regimen. No medication changes on 01/16/22  apixaban  5 mg Oral BID   clozapine  50 mg Oral Daily   cloZAPine  75 mg Oral QHS   diltiazem  240 mg Oral Daily   divalproex  500 mg Oral BID   fluticasone furoate-vilanterol  1 puff Inhalation Daily   insulin aspart  0-9 Units Subcutaneous TID WC   insulin glargine-yfgn  8 Units Subcutaneous Daily   latanoprost  1 drop Both Eyes QHS   metFORMIN  1,000 mg Oral BID WC   multivitamins with iron  1 tablet Oral Daily   nicotine  7 mg Transdermal Daily   polyethylene glycol  17 g Oral Daily   Vitamin D (Ergocalciferol)  50,000 Units Oral Q7 days    Labs:  CBG today is 103     Evie Crumpler L, NP 01/16/2022 9:11 AM

## 2022-01-17 DIAGNOSIS — F209 Schizophrenia, unspecified: Secondary | ICD-10-CM | POA: Diagnosis not present

## 2022-01-17 LAB — GLUCOSE, CAPILLARY
Glucose-Capillary: 117 mg/dL — ABNORMAL HIGH (ref 70–99)
Glucose-Capillary: 181 mg/dL — ABNORMAL HIGH (ref 70–99)
Glucose-Capillary: 173 mg/dL — ABNORMAL HIGH (ref 70–99)

## 2022-01-17 NOTE — Progress Notes (Signed)
Pt pleasant and cooperative. She is able to make her needs known, and is ambulating to the bathroom. She does hyperfocus on food and requested multiple things this am for breakfast. CBG completed before am meal. Pt compliant with am medications. Denies any SI or HI or A/V Hallucinations and reports sleeping well. Pt is safe.

## 2022-01-17 NOTE — ED Notes (Signed)
Patient resting quietly in bed watching television. Patient denies SI, HI and AVH. Patient denies pain and discomfort. Patint's Respirations equal and unlabored, skin warm and dry, NAD. Routine safety checks conducted according to facility protocol. Will continue to monitor for safety.

## 2022-01-17 NOTE — ED Provider Notes (Signed)
Behavioral Health Progress Note  Date and Time: 01/17/2022 8:09 AM Name: Teresa Murillo MRN:  [id number]   Per H&P, Teresa Murillo is a 61 y.o. female with a past psychiatric history of schizophrenia, aggressive behavior, and possible intellectual disability presenting to [facility] on 11/23/21 voluntarily as a walk in via [facility] with complaints that she was locked out of the group home. Patient has been dismissed from group home due to multiple elopements. Pt had already been discharged from this facility but had been living there temporarily while DSS was looking for new placement. Pt is boarding at [facility].  Subjective:  "I feel a little tired. I'm okay, I'm just resting."   Patient seen and evaluated face-to-face by this provider, and chart reviewed. On evaluation, patient is alert and oriented x 3. Her thought process is logical and relevant. Her speech is clear and coherent. Her mood is dysphoric and affect is congruent. She reports feeling sad today because she is unable to go anywhere. She denies suicidal ideations. She denies homicidal ideations. She denies auditory or visual hallucinations. There is no objective evidence that the patient is currently responding to internal or external stimuli. She reports fair sleep. She reports a fair appetite. She denies physical complaints. She is medication compliant and denies medication side effects at this time. No muscle spasms, involuntary movements, chest pains, or shortness of breath. She was advised that the social worker will follow up on placement and update her tomorrow. She verbalizes understanding.   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 On the Wechsler Adult Intelligence Scale-4, Ms. Singleterry achieved a full-scale IQ score of 73 and a percentile rank of 4 placing her in the borderline range of intellectual functioning.    Chronic obstructive pulmonary disease (COPD) (HCC)    Glaucoma    Hyperlipidemia    Hypertension    Iron deficiency    Schizoaffective disorder (HCC)    Type 2 diabetes mellitus (HCC)     Past Surgical History:  Procedure Laterality Date   TUBAL LIGATION     Family History:  Family History  Problem Relation Age of Onset   Breast cancer Maternal Grandmother    Family Psychiatric  History: No history reported.  Social History:  Social History   Substance and Sexual Activity  Alcohol Use Yes     Social History   Substance and Sexual Activity  Drug Use Not Currently    Social History   Socioeconomic History   Marital status: Divorced    Spouse name: Not on file   Number of children: Not on file   Years of education: Not on file   Highest education level: Not on file  Occupational History   Not on file  Tobacco Use   Smoking status: Every Day    Packs/day: 1.00    Types: Cigars, Cigarettes   Smokeless tobacco: Current  Vaping Use   Vaping Use: Never used  Substance and Sexual Activity   Alcohol use: Yes   Drug use: Not Currently   Sexual activity: Not Currently    Birth control/protection: Surgical  Other Topics Concern   Not on  file  Social History Narrative   Not on file   Social Determinants of Health   Financial Resource Strain: Not on file  Food Insecurity: Not on file  Transportation Needs: Not on file  Physical Activity: Not on file  Stress: Not on file  Social Connections: Not on file   SDOH:  SDOH Screenings   Depression (PHQ2-9): Low Risk  (11/23/2021)  Tobacco Use: High Risk (12/30/2021)   Additional Social History:    Pain  Medications: See MAR Prescriptions: See MAR Over the Counter: See MAR History of alcohol / drug use?: No history of alcohol / drug abuse Longest period of sobriety (when/how long): N/A     Current Medications:  Current Facility-Administered Medications  Medication Dose Route Frequency Provider Last Rate Last Admin   acetaminophen (TYLENOL) tablet 650 mg  650 mg Oral Q6H PRN Rankin, Shuvon B, NP   650 mg at 01/15/22 0037   albuterol (VENTOLIN HFA) 108 (90 Base) MCG/ACT inhaler 2 puff  2 puff Inhalation Q6H PRN Rankin, Shuvon B, NP   2 puff at 12/09/21 2102   alum & mag hydroxide-simeth (MAALOX/MYLANTA) 200-200-20 MG/5ML suspension 30 mL  30 mL Oral Q4H PRN Rankin, Shuvon B, NP   30 mL at 12/19/21 2109   apixaban (ELIQUIS) tablet 5 mg  5 mg Oral BID Rankin, Shuvon B, NP   5 mg at 01/16/22 2223   cloZAPine (CLOZARIL) tablet 50 mg  50 mg Oral Daily [name], NP   50 mg at 01/16/22 0913   cloZAPine (CLOZARIL) tablet 75 mg  75 mg Oral Dorie Rank, NP   75 mg at 01/16/22 2223   diltiazem (CARDIZEM CD) 24 hr capsule 240 mg  240 mg Oral Daily Rankin, Shuvon B, NP   240 mg at 01/16/22 0914   divalproex (DEPAKOTE ER) 24 hr tablet 500 mg  500 mg Oral BID Rankin, Shuvon B, NP   500 mg at 01/16/22 2223   fluticasone furoate-vilanterol (BREO ELLIPTA) 200-25 MCG/ACT 1 puff  1 puff Inhalation Daily Rankin, Shuvon B, NP   1 puff at 01/16/22 0759   haloperidol (HALDOL) tablet 10 mg  10 mg Oral Q8H PRN Rankin, Shuvon B, NP       insulin aspart (novoLOG) injection 0-9 Units  0-9 Units Subcutaneous TID WC Tharon Aquas, NP   3 Units at 01/16/22 1637   insulin glargine-yfgn (SEMGLEE) injection 8 Units  8 Units Subcutaneous Daily Tharon Aquas, NP   8 Units at 01/16/22 0917   latanoprost (XALATAN) 0.005 % ophthalmic solution 1 drop  1 drop Both Eyes QHS Rankin, Shuvon B, NP   1 drop at 01/15/22 2132   magnesium hydroxide (MILK OF MAGNESIA) suspension 30 mL  30 mL Oral Daily PRN Rankin, Shuvon  B, NP       menthol-cetylpyridinium (CEPACOL) lozenge 3 mg  1 lozenge Oral PRN [name], NP   3 mg at 01/17/22 0049   metFORMIN (GLUCOPHAGE-XR) 24 hr tablet 1,000 mg  1,000 mg Oral BID WC [name], DO   1,000 mg at 01/16/22 1700   multivitamins with iron tablet 1 tablet  1 tablet Oral Daily [name], DO   1 tablet at 01/16/22 0914   nicotine (NICODERM CQ - dosed in mg/24 hr) patch 7 mg  7 mg Transdermal Daily Rankin, Shuvon B, NP   7 mg at 01/16/22 0914   polyethylene glycol (MIRALAX / GLYCOLAX) packet 17 g  17 g Oral Daily [name], DO  17 g at 01/15/22 0914   Vitamin D (Ergocalciferol) (DRISDOL) 1.25 MG (50000 UNIT) capsule 50,000 Units  50,000 Units Oral Q7 days Rankin, Shuvon B, NP   50,000 Units at 01/14/22 1007   Current Outpatient Medications  Medication Sig Dispense Refill   Accu-Chek Softclix Lancets lancets Use as directed up to four times daily 100 each 0   apixaban (ELIQUIS) 5 MG TABS tablet Take 1 tablet (5 mg total) by mouth 2 (two) times daily. 60 tablet 0   ARIPiprazole (ABILIFY) 10 MG tablet Take 1 tablet (10 mg total) by mouth daily. 30 tablet 0   Blood Glucose Monitoring Suppl (ACCU-CHEK GUIDE) w/Device KIT Use as directed up to four times daily 1 kit 0   budesonide-formoterol (SYMBICORT) 160-4.5 MCG/ACT inhaler Inhale 2 puffs into the lungs in the morning and at bedtime.     Cholecalciferol (VITAMIN D3) 1.25 MG (50000 UT) CAPS Take 50,000 Units by mouth every Thursday.     cloZAPine (CLOZARIL) 25 MG tablet Take 3 tablets (75 mg total) by mouth at bedtime. 90 tablet 0   clozapine (CLOZARIL) 50 MG tablet Take 1 tablet (50 mg total) by mouth daily. 30 tablet 0   diltiazem (CARDIZEM CD) 240 MG 24 hr capsule Take 1 capsule (240 mg total) by mouth daily. (Patient not taking: Reported on 11/24/2021) 30 capsule 0   divalproex (DEPAKOTE ER) 500 MG 24 hr tablet Take 1 tablet (500 mg total) by mouth 2 (two) times daily. 60 tablet 0   glucose blood test strip Use as  directed up to four times daily 50 each 0   haloperidol (HALDOL) 10 MG tablet Take 1 tablet (10 mg total) by mouth 3 (three) times daily as needed for agitation (and psychotic symptoms).     INGREZZA 40 MG capsule Take 1 capsule (40 mg total) by mouth in the morning. 30 capsule 0   insulin aspart (NOVOLOG) 100 UNIT/ML FlexPen Before each meal 3 times a day, 140-199 - 2 units, 200-250 - 4 units, 251-299 - 6 units,  300-349 - 8 units,  350 or above 10 units. Insulin PEN if approved, provide syringes and needles if needed.Please switch to any approved short acting Insulin if needed. 15 mL 0   insulin glargine (LANTUS) 100 UNIT/ML Solostar Pen Inject 12 Units into the skin daily. 15 mL 0   Insulin Pen Needle 32G X 4 MM MISC Use 4 times a day with insulin, 1 month supply. 100 each 0   latanoprost (XALATAN) 0.005 % ophthalmic solution Place 1 drop into both eyes at bedtime.     metFORMIN (GLUCOPHAGE) 500 MG tablet Take 500 mg by mouth 2 (two) times daily with a meal.     nicotine (NICODERM CQ - DOSED IN MG/24 HR) 7 mg/24hr patch Place 1 patch (7 mg total) onto the skin daily. 28 patch 0   PROAIR HFA 108 (90 Base) MCG/ACT inhaler Inhale 2 puffs into the lungs every 6 (six) hours as needed for wheezing or shortness of breath.      Labs  Lab Results:  No results displayed because visit has over 200 results.    Admission on 11/22/2021, Discharged on 11/22/2021  Component Date Value Ref Range Status   Glucose-Capillary 11/22/2021 169 (H)  70 - 99 mg/dL Final   Glucose reference range applies only to samples taken after fasting for at least 8 hours.  No results displayed because visit has over 200 results.    Admission on 10/04/2021, Discharged on 10/22/2021  Component Date Value Ref Range Status   SARS Coronavirus 2 by RT PCR 10/04/2021 NEGATIVE  NEGATIVE Final   Comment: (NOTE) SARS-CoV-2 target nucleic acids are NOT DETECTED.  The SARS-CoV-2 RNA is generally detectable in upper  respiratory specimens during the acute phase of infection. The lowest concentration of SARS-CoV-2 viral copies this assay can detect is 138 copies/mL. A negative result does not preclude SARS-Cov-2 infection and should not be used as the sole basis for treatment or other patient management decisions. A negative result may occur with  improper specimen collection/handling, submission of specimen other than nasopharyngeal swab, presence of viral mutation(s) within the areas targeted by this assay, and inadequate number of viral copies(<138 copies/mL). A negative result must be combined with clinical observations, patient history, and epidemiological information. The expected result is Negative.  Fact Sheet for Patients:  EntrepreneurPulse.com.au  Fact Sheet for Healthcare Providers:  IncredibleEmployment.be  This test is no                          t yet approved or cleared by the [place] FDA and  has been authorized for detection and/or diagnosis of SARS-CoV-2 by FDA under an Emergency Use Authorization (EUA). This EUA will remain  in effect (meaning this test can be used) for the duration of the COVID-19 declaration under Section 564(b)(1) of the Act, 21 U.S.C.section 360bbb-3(b)(1), unless the authorization is terminated  or revoked sooner.       Influenza A by PCR 10/04/2021 NEGATIVE  NEGATIVE Final   Influenza B by PCR 10/04/2021 NEGATIVE  NEGATIVE Final   Comment: (NOTE) The Xpert Xpress SARS-CoV-2/FLU/RSV plus assay is intended as an aid in the diagnosis of influenza from Nasopharyngeal swab specimens and should not be used as a sole basis for treatment. Nasal washings and aspirates are unacceptable for Xpert Xpress SARS-CoV-2/FLU/RSV testing.  Fact Sheet for Patients: EntrepreneurPulse.com.au  Fact Sheet for Healthcare Providers: IncredibleEmployment.be  This test is not yet approved or  cleared by the [place] FDA and has been authorized for detection and/or diagnosis of SARS-CoV-2 by FDA under an Emergency Use Authorization (EUA). This EUA will remain in effect (meaning this test can be used) for the duration of the COVID-19 declaration under Section 564(b)(1) of the Act, 21 U.S.C. section 360bbb-3(b)(1), unless the authorization is terminated or revoked.  Performed at [facility], [place] [address] [place], [place] [address]    WBC 10/04/2021 8.4  4.0 - 10.5 K/uL Final   RBC 10/04/2021 4.42  3.87 - 5.11 MIL/uL Final   Hemoglobin 10/04/2021 11.6 (L)  12.0 - 15.0 g/dL Final   HCT 10/04/2021 35.7 (L)  36.0 - 46.0 % Final   MCV 10/04/2021 80.8  80.0 - 100.0 fL Final   MCH 10/04/2021 26.2  26.0 - 34.0 pg Final   MCHC 10/04/2021 32.5  30.0 - 36.0 g/dL Final   RDW 10/04/2021 16.0 (H)  11.5 - 15.5 % Final   Platelets 10/04/2021 372  150 - 400 K/uL Final   nRBC 10/04/2021 0.0  0.0 - 0.2 % Final   Neutrophils Relative % 10/04/2021 68  % Final   Neutro Abs 10/04/2021 5.7  1.7 - 7.7 K/uL Final   Lymphocytes Relative 10/04/2021 22  % Final   Lymphs Abs 10/04/2021 1.8  0.7 - 4.0 K/uL Final   Monocytes Relative 10/04/2021 8  % Final   Monocytes Absolute 10/04/2021 0.7  0.1 - 1.0 K/uL Final  Eosinophils Relative 10/04/2021 1  % Final   Eosinophils Absolute 10/04/2021 0.1  0.0 - 0.5 K/uL Final   Basophils Relative 10/04/2021 1  % Final   Basophils Absolute 10/04/2021 0.1  0.0 - 0.1 K/uL Final   Immature Granulocytes 10/04/2021 0  % Final   Abs Immature Granulocytes 10/04/2021 0.03  0.00 - 0.07 K/uL Final   Performed at [facility], [place] [address] [place], [place] [address]   Sodium 10/04/2021 136  135 - 145 mmol/L Final   Potassium 10/04/2021 4.2  3.5 - 5.1 mmol/L Final   Chloride 10/04/2021 104  98 - 111 mmol/L Final   CO2 10/04/2021 25  22 - 32 mmol/L Final   Glucose, Bld 10/04/2021 100 (H)  70 - 99 mg/dL Final   Glucose reference range applies only to  samples taken after fasting for at least 8 hours.   BUN 10/04/2021 9  6 - 20 mg/dL Final   Creatinine, Ser 10/04/2021 0.52  0.44 - 1.00 mg/dL Final   Calcium 10/04/2021 9.0  8.9 - 10.3 mg/dL Final   Total Protein 10/04/2021 7.0  6.5 - 8.1 g/dL Final   Albumin 10/04/2021 3.2 (L)  3.5 - 5.0 g/dL Final   AST 10/04/2021 13 (L)  15 - 41 U/L Final   ALT 10/04/2021 10  0 - 44 U/L Final   Alkaline Phosphatase 10/04/2021 61  38 - 126 U/L Final   Total Bilirubin 10/04/2021 0.3  0.3 - 1.2 mg/dL Final   GFR, Estimated 10/04/2021 >60  >60 mL/min Final   Comment: (NOTE) Calculated using the CKD-EPI Creatinine Equation (2021)    Anion gap 10/04/2021 7  5 - 15 Final   Performed at [place] [address] [place], [place] [address]   Hgb A1c MFr Bld 10/04/2021 7.0 (H)  4.8 - 5.6 % Final   Comment: (NOTE) Pre diabetes:          5.7%-6.4%  Diabetes:              >6.4%  Glycemic control for   <7.0% adults with diabetes    Mean Plasma Glucose 10/04/2021 154.2  mg/dL Final   Performed at [facility], [place] [address], [place], [place] [address]   Cholesterol 10/04/2021 178  0 - 200 mg/dL Final   Triglycerides 10/04/2021 155 (H)  <150 mg/dL Final   HDL 10/04/2021 52  >40 mg/dL Final   Total CHOL/HDL Ratio 10/04/2021 3.4  RATIO Final   VLDL 10/04/2021 31  0 - 40 mg/dL Final   LDL Cholesterol 10/04/2021 95  0 - 99 mg/dL Final   Comment:        Total Cholesterol/HDL:CHD Risk Coronary Heart Disease Risk Table                     Men   Women  1/2 Average Risk   3.4   3.3  Average Risk       5.0   4.4  2 X Average Risk   9.6   7.1  3 X Average Risk  23.4   11.0        Use the calculated Patient Ratio above and the CHD Risk Table to determine the patient's CHD Risk.        ATP III CLASSIFICATION (LDL):  <100     mg/dL   Optimal  100-129  mg/dL   Near or Above  Optimal  130-159  mg/dL   Borderline  160-189  mg/dL   High  >190     mg/dL   Very High Performed at  [facility] [address] [place], [place] [address]    POC Amphetamine UR 10/04/2021 None Detected  NONE DETECTED (Cut Off Level 1000 ng/mL) Final   POC Secobarbital (BAR) 10/04/2021 None Detected  NONE DETECTED (Cut Off Level 300 ng/mL) Final   POC Buprenorphine (BUP) 10/04/2021 None Detected  NONE DETECTED (Cut Off Level 10 ng/mL) Final   POC Oxazepam (BZO) 10/04/2021 None Detected  NONE DETECTED (Cut Off Level 300 ng/mL) Final   POC Cocaine UR 10/04/2021 None Detected  NONE DETECTED (Cut Off Level 300 ng/mL) Final   POC Methamphetamine UR 10/04/2021 None Detected  NONE DETECTED (Cut Off Level 1000 ng/mL) Final   POC Morphine 10/04/2021 None Detected  NONE DETECTED (Cut Off Level 300 ng/mL) Final   POC Methadone UR 10/04/2021 None Detected  NONE DETECTED (Cut Off Level 300 ng/mL) Final   POC Oxycodone UR 10/04/2021 Positive (A)  NONE DETECTED (Cut Off Level 100 ng/mL) Final   POC Marijuana UR 10/04/2021 None Detected  NONE DETECTED (Cut Off Level 50 ng/mL) Final   SARSCOV2ONAVIRUS 2 AG 10/04/2021 NEGATIVE  NEGATIVE Final   Comment: (NOTE) SARS-CoV-2 antigen NOT DETECTED.   Negative results are presumptive.  Negative results do not preclude SARS-CoV-2 infection and should not be used as the sole basis for treatment or other patient management decisions, including infection  control decisions, particularly in the presence of clinical signs and  symptoms consistent with COVID-19, or in those who have been in contact with the virus.  Negative results must be combined with clinical observations, patient history, and epidemiological information. The expected result is Negative.  Fact Sheet for Patients: HandmadeRecipes.com.cy  Fact Sheet for Healthcare Providers: FuneralLife.at  This test is not yet approved or cleared by the [place] FDA and  has been authorized for detection and/or diagnosis of SARS-CoV-2 by FDA under an  Emergency Use Authorization (EUA).  This EUA will remain in effect (meaning this test can be used) for the duration of  the COV                          ID-19 declaration under Section 564(b)(1) of the Act, 21 U.S.C. section 360bbb-3(b)(1), unless the authorization is terminated or revoked sooner.     Valproic Acid Lvl 10/04/2021 51  50.0 - 100.0 ug/mL Final   Performed at [facility] [address], [place], [place] [address]   Valproic Acid Lvl 10/08/2021 57  50.0 - 100.0 ug/mL Final   Performed at [facility] [address] [place], [place] [address]   Glucose-Capillary 10/09/2021 123 (H)  70 - 99 mg/dL Final   Glucose reference range applies only to samples taken after fasting for at least 8 hours.  Admission on 09/09/2021, Discharged on 09/10/2021  Component Date Value Ref Range Status   Sodium 09/09/2021 134 (L)  135 - 145 mmol/L Final   Potassium 09/09/2021 4.3  3.5 - 5.1 mmol/L Final   Chloride 09/09/2021 99  98 - 111 mmol/L Final   CO2 09/09/2021 25  22 - 32 mmol/L Final   Glucose, Bld 09/09/2021 107 (H)  70 - 99 mg/dL Final   Glucose reference range applies only to samples taken after fasting for at least 8 hours.   BUN 09/09/2021 12  6 - 20 mg/dL Final   Creatinine, Ser 09/09/2021 0.74  0.44 - 1.00 mg/dL Final   Calcium 09/09/2021 9.0  8.9 - 10.3 mg/dL Final   Total Protein 09/09/2021 7.4  6.5 - 8.1 g/dL Final   Albumin 09/09/2021 3.1 (L)  3.5 - 5.0 g/dL Final   AST 09/09/2021 13 (L)  15 - 41 U/L Final   ALT 09/09/2021 13  0 - 44 U/L Final   Alkaline Phosphatase 09/09/2021 66  38 - 126 U/L Final   Total Bilirubin 09/09/2021 0.2 (L)  0.3 - 1.2 mg/dL Final   GFR, Estimated 09/09/2021 >60  >60 mL/min Final   Comment: (NOTE) Calculated using the CKD-EPI Creatinine Equation (2021)    Anion gap 09/09/2021 10  5 - 15 Final   Performed at [facility], [place] [address], [place], [place] [address]   WBC 09/09/2021 8.5  4.0 - 10.5 K/uL Final   RBC  09/09/2021 4.53  3.87 - 5.11 MIL/uL Final   Hemoglobin 09/09/2021 11.8 (L)  12.0 - 15.0 g/dL Final   HCT 09/09/2021 38.0  36.0 - 46.0 % Final   MCV 09/09/2021 83.9  80.0 - 100.0 fL Final   MCH 09/09/2021 26.0  26.0 - 34.0 pg Final   MCHC 09/09/2021 31.1  30.0 - 36.0 g/dL Final   RDW 09/09/2021 17.8 (H)  11.5 - 15.5 % Final   Platelets 09/09/2021 442 (H)  150 - 400 K/uL Final   nRBC 09/09/2021 0.0  0.0 - 0.2 % Final   Neutrophils Relative % 09/09/2021 70  % Final   Neutro Abs 09/09/2021 5.9  1.7 - 7.7 K/uL Final   Lymphocytes Relative 09/09/2021 20  % Final   Lymphs Abs 09/09/2021 1.7  0.7 - 4.0 K/uL Final   Monocytes Relative 09/09/2021 8  % Final   Monocytes Absolute 09/09/2021 0.7  0.1 - 1.0 K/uL Final   Eosinophils Relative 09/09/2021 1  % Final   Eosinophils Absolute 09/09/2021 0.1  0.0 - 0.5 K/uL Final   Basophils Relative 09/09/2021 1  % Final   Basophils Absolute 09/09/2021 0.1  0.0 - 0.1 K/uL Final   Immature Granulocytes 09/09/2021 0  % Final   Abs Immature Granulocytes 09/09/2021 0.03  0.00 - 0.07 K/uL Final   Performed at [facility], [place] [address] [place], [place] [address]   Ammonia 09/09/2021 37 (H)  9 - 35 umol/L Final   Comment: HEMOLYSIS AT THIS LEVEL MAY AFFECT RESULT Performed at [facility], [place] [address], [place], [place] [address]    Opiates 09/09/2021 NONE DETECTED  NONE DETECTED Final   Cocaine 09/09/2021 NONE DETECTED  NONE DETECTED Final   Benzodiazepines 09/09/2021 NONE DETECTED  NONE DETECTED Final   Amphetamines 09/09/2021 NONE DETECTED  NONE DETECTED Final   Tetrahydrocannabinol 09/09/2021 NONE DETECTED  NONE DETECTED Final   Barbiturates 09/09/2021 NONE DETECTED  NONE DETECTED Final   Comment: (NOTE) DRUG SCREEN FOR MEDICAL PURPOSES ONLY.  IF CONFIRMATION IS NEEDED FOR ANY PURPOSE, NOTIFY LAB WITHIN 5 DAYS.  LOWEST DETECTABLE LIMITS FOR URINE DRUG SCREEN Drug Class                     Cutoff (ng/mL) Amphetamine and metabolites     1000 Barbiturate and metabolites    200 Benzodiazepine                 254 Tricyclics and metabolites     300 Opiates and metabolites  300 Cocaine and metabolites        300 THC                            50 Performed at [facility], [place] [address], [place], [place] [address]    Alcohol, Ethyl (B) 09/09/2021 <10  <10 mg/dL Final   Comment: (NOTE) Lowest detectable limit for serum alcohol is 10 mg/dL.  For medical purposes only. Performed at [facility], [place] [address] [place], [place] [address]    Lipase 09/09/2021 33  11 - 51 U/L Final   Performed at [facility] [address], [place], [place] [address]   Color, Urine 09/09/2021 COLORLESS (A)  YELLOW Final   APPearance 09/09/2021 CLEAR  CLEAR Final   Specific Gravity, Urine 09/09/2021 1.002 (L)  1.005 - 1.030 Final   pH 09/09/2021 6.0  5.0 - 8.0 Final   Glucose, UA 09/09/2021 NEGATIVE  NEGATIVE mg/dL Final   Hgb urine dipstick 09/09/2021 NEGATIVE  NEGATIVE Final   Bilirubin Urine 09/09/2021 NEGATIVE  NEGATIVE Final   Ketones, ur 09/09/2021 NEGATIVE  NEGATIVE mg/dL Final   Protein, ur 09/09/2021 NEGATIVE  NEGATIVE mg/dL Final   Nitrite 09/09/2021 NEGATIVE  NEGATIVE Final   Leukocytes,Ua 09/09/2021 NEGATIVE  NEGATIVE Final   Performed at [facility] [address] [place], [place] [address]   Specimen Description 09/09/2021 URINE, CLEAN CATCH   Final   Special Requests 09/09/2021 NONE   Final   Culture 09/09/2021  (A)   Final                   Value:<10,000 COLONIES/mL INSIGNIFICANT GROWTH Performed at [facility], [place] [address], [place], [place] [address]    Report Status 09/09/2021 09/10/2021 FINAL   Final   D-Dimer, Quant 09/09/2021 0.92 (H)  0.00 - 0.50 ug/mL-FEU Final   Comment: (NOTE) At the manufacturer cut-off value of 0.5 g/mL FEU, this assay has a negative predictive value of 95-100%.This assay is intended for use in conjunction with a clinical pretest probability  (PTP) assessment model to exclude pulmonary embolism (PE) and deep venous thrombosis (DVT) in outpatients suspected of PE or DVT. Results should be correlated with clinical presentation. Performed at [facility], [place] [address] [place], [place] [address]    Troponin I (High Sensitivity) 09/09/2021 5  <18 ng/L Final   Comment: (NOTE) Elevated high sensitivity troponin I (hsTnI) values and significant  changes across serial measurements may suggest ACS but many other  chronic and acute conditions are known to elevate hsTnI results.  Refer to the "Links" section for chest pain algorithms and additional  guidance. Performed at [facility], [place] [address], [place], [place] [address]    BP 09/10/2021 135/83  mmHg Final   S' Lateral 09/10/2021 2.30  cm Final   Area-P 1/2 09/10/2021 5.58  cm2 Final    Blood Alcohol level:  Lab Results  Component Value Date   ETH <10 11/23/2021   ETH <10 94/50/3888    Metabolic Disorder Labs: Lab Results  Component Value Date   HGBA1C 6.7 (H) 12/28/2021   MPG 145.59 12/28/2021   MPG 154.2 10/04/2021   Lab Results  Component Value Date   PROLACTIN 3.8 (L) 11/23/2021   Lab Results  Component Value Date   CHOL 171 11/23/2021   TRIG 125 11/23/2021   HDL 65 11/23/2021  CHOLHDL 2.6 11/23/2021   VLDL 25 11/23/2021   LDLCALC 81 11/23/2021   LDLCALC 95 10/04/2021    Therapeutic Lab Levels: No results found for: "LITHIUM" Lab Results  Component Value Date   VALPROATE 33 (L) 12/28/2021   VALPROATE 54 12/03/2021   No results found for: "CBMZ"  Physical Findings   PHQ2-9    [place] ED from 11/23/2021 in [facility]  PHQ-2 Total Score 2  PHQ-9 Total Score 3      Flowsheet Row ED from 11/23/2021 in [facility] Most recent reading at 11/23/2021  6:44 PM ED from 11/22/2021 in [place] [facility] Most recent reading at 11/22/2021  8:28 PM  ED from 11/22/2021 in [place] [facility] Most recent reading at 11/22/2021  1:24 AM  C-SSRS RISK CATEGORY No Risk No Risk No Risk        Musculoskeletal  Strength & Muscle Tone: within normal limits Gait & Station: normal Patient leans: N/A  Psychiatric Specialty Exam  Presentation  General Appearance:  Appropriate for Environment  Eye Contact: Fair  Speech: Clear and Coherent  Speech Volume: Normal  Handedness: Right   Mood and Affect  Mood: Euthymic  Affect: Congruent   Thought Process  Thought Processes: Coherent  Descriptions of Associations:Intact  Orientation:Full (Time, Place and Person)  Thought Content:Logical  Diagnosis of Schizophrenia or Schizoaffective disorder in past: Yes  Duration of Psychotic Symptoms: No data recorded  Hallucinations:Hallucinations: None  Ideas of Reference:None  Suicidal Thoughts:Suicidal Thoughts: No  Homicidal Thoughts:Homicidal Thoughts: No   Sensorium  Memory: Immediate Fair; Recent Fair; Remote Fair  Judgment: Fair  Insight: Fair   Community education officer  Concentration: Fair  Attention Span: Fair  Recall: [employer] of Knowledge: Fair  Language: Fair   Psychomotor Activity  Psychomotor Activity: Psychomotor Activity: Normal   Assets  Assets: Communication Skills; Financial Resources/Insurance; Desire for Improvement   Sleep  Sleep: Sleep: Fair Number of Hours of Sleep: 8   Physical Exam  Physical Exam HENT:     Head: Normocephalic.     Nose: Nose normal.  Eyes:     Conjunctiva/sclera: Conjunctivae normal.  Cardiovascular:     Rate and Rhythm: Normal rate.  Pulmonary:     Effort: Pulmonary effort is normal.  Musculoskeletal:        General: Normal range of motion.  Neurological:     Mental Status: She is alert and oriented to person, place, and time.    Review of Systems  Constitutional: Negative.   HENT: Negative.    Gastrointestinal:  Negative.   Genitourinary: Negative.   Musculoskeletal: Negative.   Neurological: Negative.   Endo/Heme/Allergies: Negative.    Blood pressure 129/71, pulse 79, temperature (!) 97.4 F (36.3 C), temperature source Oral, resp. rate 18, SpO2 98 %. There is no height or weight on file to calculate BMI.  Treatment Plan Summary:  Patient has been psychiatrically cleared and does not meet criteria for inpatient psychiatric treatment. She is unable to return to group home unless she has been assigned a guardian. She is unable to care for herself and has been boarding on the continuous assessment unit while awaiting DSS caseworker to find appropriate placement. Social work/TOC working with DSS to find placement. Per Boris Lown, LCSW, Per Kevin Fenton, [facility] social work supervisor, their attorney determined the patient has the capacity to make placement decisions and they will not be seeking guardianship.    Per chart review,  AID to Capacity Evaluation (ACE) completed by Dr. Louis Meckel, Dr. Dwyane Dee on 12/11/2021.  Please see media tab for full details.    [name], PhD completed: Wechsler Adult Intelligence Scale-4, Ms. 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  Current medication regimen. No medication changes on 01/17/22  apixaban  5 mg Oral BID   clozapine  50 mg Oral Daily   cloZAPine  75 mg Oral QHS   diltiazem  240 mg Oral Daily   divalproex  500 mg Oral BID   fluticasone furoate-vilanterol  1 puff Inhalation Daily   insulin aspart  0-9 Units Subcutaneous TID WC   insulin glargine-yfgn  8 Units Subcutaneous Daily   latanoprost  1 drop Both Eyes QHS   metFORMIN  1,000 mg Oral BID WC   multivitamins with iron  1 tablet Oral Daily   nicotine  7 mg Transdermal Daily   polyethylene glycol  17 g Oral Daily   Vitamin D  (Ergocalciferol)  50,000 Units Oral Q7 days    Labs  CBG 117  Dionysios Massman L, NP 01/17/2022 8:09 AM

## 2022-01-18 DIAGNOSIS — F209 Schizophrenia, unspecified: Secondary | ICD-10-CM | POA: Diagnosis not present

## 2022-01-18 LAB — GLUCOSE, CAPILLARY
Glucose-Capillary: 112 mg/dL — ABNORMAL HIGH (ref 70–99)
Glucose-Capillary: 112 mg/dL — ABNORMAL HIGH (ref 70–99)
Glucose-Capillary: 157 mg/dL — ABNORMAL HIGH (ref 70–99)
Glucose-Capillary: 140 mg/dL — ABNORMAL HIGH (ref 70–99)

## 2022-01-18 LAB — VALPROIC ACID LEVEL: Valproic Acid Lvl: 33 ug/mL — ABNORMAL LOW (ref 50.0–100.0)

## 2022-01-18 NOTE — ED Notes (Signed)
Pt A&O x 4, no distress noted, calm & cooperative. Pleasant.  Monitoring for safety.

## 2022-01-18 NOTE — ED Provider Notes (Signed)
Behavioral Health Progress Note  Date and Time: 01/18/2022 6:34 PM Name: Adin Lariccia MRN:  [id number]  Subjective:  [name] 61 y.o., female patient who initially admitted to [facility] on 11/23/2021 with complaints of being locked out of her group home.  Patient was dismissed from group home due to multiple elopements.  Patient is psychiatrically cleared but has remained in the facility boarding while DSS and social work look for new placement.  [name], 61 y.o., female patient seen face to face by this provider, consulted with Dr. Lovette Cliche; and chart reviewed on 01/18/22.  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  She is currently prescribed Clozarill 50 mg daily, Clozaril 75 mg nightly, and Depakote 500 mg twice daily.  She is tolerating medications without any adverse reactions.  Last valproic acid level was obtained on 12/28/21 result was 33.  Will obtain new valproic acid level today.  Multiple attempts were made throughout the day to assess [facility] but she was sleeping.  Today's reevaluation Skyanne Welle is observed sitting in her bed eating a snack in no acute distress.  She is alert/oriented x4, cooperative, and attentive.  She is casually dressed and makes good eye contact.  She is denying any depression or anxiety and has a euthymic affect.  She asked, "when am I going to get out of  this place".  Provided reassurance and encouragement.  She is denying any concerns with her appetite.  Reports she has not slept well in the past 2 days due to other patients making noise at nighttime.  She is denying SI/HI/AVH.  Objectively she does not appear to be responding to internal/external stimuli.  Irianna continues to be compliant and appropriate with staff and other patients.  She is compliant with medications.  She has exhibited no unsafe behaviors while on the unit.  She has required no as needed  medications for agitation.    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 On the Wechsler Adult Intelligence Scale-4, Ms. Barbary achieved a full-scale IQ score of 73 and a percentile rank of 4 placing her in the borderline range of intellectual functioning.    Chronic obstructive pulmonary disease (COPD) (HCC)    Glaucoma    Hyperlipidemia    Hypertension    Iron deficiency    Schizoaffective disorder (HCC)    Type 2 diabetes mellitus (HCC)     Past Surgical History:  Procedure Laterality Date   TUBAL LIGATION     Family History:  Family History  Problem Relation Age of Onset   Breast cancer Maternal Grandmother    Family Psychiatric  History: see H&P Social History:  Social History   Substance and Sexual Activity  Alcohol Use Yes     Social History   Substance and Sexual Activity  Drug Use Not Currently    Social History   Socioeconomic History   Marital status: Divorced    Spouse name: Not on file   Number of children: Not on file   Years of education: Not on file   Highest education level: Not on file  Occupational History  Not on file  Tobacco Use   Smoking status: Every Day    Packs/day: 1.00    Types: Cigars, Cigarettes   Smokeless tobacco: Current  Vaping Use   Vaping Use: Never used  Substance and Sexual Activity   Alcohol use: Yes   Drug use: Not Currently   Sexual activity: Not Currently    Birth control/protection: Surgical  Other Topics Concern   Not on file  Social History Narrative   Not on file   Social Determinants of Health   Financial Resource Strain:  Not on file  Food Insecurity: Not on file  Transportation Needs: Not on file  Physical Activity: Not on file  Stress: Not on file  Social Connections: Not on file   SDOH:  SDOH Screenings   Depression (PHQ2-9): Low Risk  (11/23/2021)  Tobacco Use: High Risk (12/30/2021)   Additional Social History:    Pain Medications: See MAR Prescriptions: See MAR Over the Counter: See MAR History of alcohol / drug use?: No history of alcohol / drug abuse Longest period of sobriety (when/how long): N/A                    Sleep: Fair  Appetite:  Good  Current Medications:  Current Facility-Administered Medications  Medication Dose Route Frequency Provider Last Rate Last Admin   acetaminophen (TYLENOL) tablet 650 mg  650 mg Oral Q6H PRN Rankin, Shuvon B, NP   650 mg at 01/15/22 0037   albuterol (VENTOLIN HFA) 108 (90 Base) MCG/ACT inhaler 2 puff  2 puff Inhalation Q6H PRN Rankin, Shuvon B, NP   2 puff at 12/09/21 2102   alum & mag hydroxide-simeth (MAALOX/MYLANTA) 200-200-20 MG/5ML suspension 30 mL  30 mL Oral Q4H PRN Rankin, Shuvon B, NP   30 mL at 12/19/21 2109   apixaban (ELIQUIS) tablet 5 mg  5 mg Oral BID Rankin, Shuvon B, NP   5 mg at 01/18/22 0801   cloZAPine (CLOZARIL) tablet 50 mg  50 mg Oral Daily [name], NP   50 mg at 01/18/22 0800   cloZAPine (CLOZARIL) tablet 75 mg  75 mg Oral QHS [name], NP   75 mg at 01/17/22 2142   diltiazem (CARDIZEM CD) 24 hr capsule 240 mg  240 mg Oral Daily Rankin, Shuvon B, NP   240 mg at 01/18/22 0800   divalproex (DEPAKOTE ER) 24 hr tablet 500 mg  500 mg Oral BID Rankin, Shuvon B, NP   500 mg at 01/18/22 0800   fluticasone furoate-vilanterol (BREO ELLIPTA) 200-25 MCG/ACT 1 puff  1 puff Inhalation Daily Rankin, Shuvon B, NP   1 puff at 01/18/22 0802   haloperidol (HALDOL) tablet 10 mg  10 mg Oral Q8H PRN Rankin, Shuvon B, NP       insulin aspart (novoLOG) injection 0-9 Units  0-9 Units Subcutaneous TID WC Tharon Aquas, NP   2  Units at 01/18/22 1648   insulin glargine-yfgn (SEMGLEE) injection 8 Units  8 Units Subcutaneous Daily Tharon Aquas, NP   8 Units at 01/18/22 0805   latanoprost (XALATAN) 0.005 % ophthalmic solution 1 drop  1 drop Both Eyes QHS Rankin, Shuvon B, NP   1 drop at 01/17/22 2200   magnesium hydroxide (MILK OF MAGNESIA) suspension 30 mL  30 mL Oral Daily PRN Rankin, Shuvon B, NP       menthol-cetylpyridinium (CEPACOL) lozenge 3 mg  1 lozenge Oral PRN [name], NP   3 mg at 01/17/22 2356  metFORMIN (GLUCOPHAGE-XR) 24 hr tablet 1,000 mg  1,000 mg Oral BID WC [name], DO   1,000 mg at 01/18/22 1648   multivitamins with iron tablet 1 tablet  1 tablet Oral Daily [name], DO   1 tablet at 01/18/22 0801   nicotine (NICODERM CQ - dosed in mg/24 hr) patch 7 mg  7 mg Transdermal Daily Rankin, Shuvon B, NP   7 mg at 01/18/22 0801   polyethylene glycol (MIRALAX / GLYCOLAX) packet 17 g  17 g Oral Daily [name], DO   17 g at 01/18/22 0801   Vitamin D (Ergocalciferol) (DRISDOL) 1.25 MG (50000 UNIT) capsule 50,000 Units  50,000 Units Oral Q7 days Rankin, Shuvon B, NP   50,000 Units at 01/14/22 1007   Current Outpatient Medications  Medication Sig Dispense Refill   Accu-Chek Softclix Lancets lancets Use as directed up to four times daily 100 each 0   apixaban (ELIQUIS) 5 MG TABS tablet Take 1 tablet (5 mg total) by mouth 2 (two) times daily. 60 tablet 0   ARIPiprazole (ABILIFY) 10 MG tablet Take 1 tablet (10 mg total) by mouth daily. 30 tablet 0   Blood Glucose Monitoring Suppl (ACCU-CHEK GUIDE) w/Device KIT Use as directed up to four times daily 1 kit 0   budesonide-formoterol (SYMBICORT) 160-4.5 MCG/ACT inhaler Inhale 2 puffs into the lungs in the morning and at bedtime.     Cholecalciferol (VITAMIN D3) 1.25 MG (50000 UT) CAPS Take 50,000 Units by mouth every Thursday.     cloZAPine (CLOZARIL) 25 MG tablet Take 3 tablets (75 mg total) by mouth at bedtime. 90 tablet 0   clozapine  (CLOZARIL) 50 MG tablet Take 1 tablet (50 mg total) by mouth daily. 30 tablet 0   diltiazem (CARDIZEM CD) 240 MG 24 hr capsule Take 1 capsule (240 mg total) by mouth daily. (Patient not taking: Reported on 11/24/2021) 30 capsule 0   divalproex (DEPAKOTE ER) 500 MG 24 hr tablet Take 1 tablet (500 mg total) by mouth 2 (two) times daily. 60 tablet 0   glucose blood test strip Use as directed up to four times daily 50 each 0   haloperidol (HALDOL) 10 MG tablet Take 1 tablet (10 mg total) by mouth 3 (three) times daily as needed for agitation (and psychotic symptoms).     INGREZZA 40 MG capsule Take 1 capsule (40 mg total) by mouth in the morning. 30 capsule 0   insulin aspart (NOVOLOG) 100 UNIT/ML FlexPen Before each meal 3 times a day, 140-199 - 2 units, 200-250 - 4 units, 251-299 - 6 units,  300-349 - 8 units,  350 or above 10 units. Insulin PEN if approved, provide syringes and needles if needed.Please switch to any approved short acting Insulin if needed. 15 mL 0   insulin glargine (LANTUS) 100 UNIT/ML Solostar Pen Inject 12 Units into the skin daily. 15 mL 0   Insulin Pen Needle 32G X 4 MM MISC Use 4 times a day with insulin, 1 month supply. 100 each 0   latanoprost (XALATAN) 0.005 % ophthalmic solution Place 1 drop into both eyes at bedtime.     metFORMIN (GLUCOPHAGE) 500 MG tablet Take 500 mg by mouth 2 (two) times daily with a meal.     nicotine (NICODERM CQ - DOSED IN MG/24 HR) 7 mg/24hr patch Place 1 patch (7 mg total) onto the skin daily. 28 patch 0   PROAIR HFA 108 (90 Base) MCG/ACT inhaler Inhale 2 puffs into the lungs  every 6 (six) hours as needed for wheezing or shortness of breath.      Labs  Lab Results:  No results displayed because visit has over 200 results.    Admission on 11/22/2021, Discharged on 11/22/2021  Component Date Value Ref Range Status   Glucose-Capillary 11/22/2021 169 (H)  70 - 99 mg/dL Final   Glucose reference range applies only to samples taken after fasting  for at least 8 hours.  No results displayed because visit has over 200 results.    Admission on 10/04/2021, Discharged on 10/22/2021  Component Date Value Ref Range Status   SARS Coronavirus 2 by RT PCR 10/04/2021 NEGATIVE  NEGATIVE Final   Comment: (NOTE) SARS-CoV-2 target nucleic acids are NOT DETECTED.  The SARS-CoV-2 RNA is generally detectable in upper respiratory specimens during the acute phase of infection. The lowest concentration of SARS-CoV-2 viral copies this assay can detect is 138 copies/mL. A negative result does not preclude SARS-Cov-2 infection and should not be used as the sole basis for treatment or other patient management decisions. A negative result may occur with  improper specimen collection/handling, submission of specimen other than nasopharyngeal swab, presence of viral mutation(s) within the areas targeted by this assay, and inadequate number of viral copies(<138 copies/mL). A negative result must be combined with clinical observations, patient history, and epidemiological information. The expected result is Negative.  Fact Sheet for Patients:  EntrepreneurPulse.com.au  Fact Sheet for Healthcare Providers:  IncredibleEmployment.be  This test is no                          t yet approved or cleared by the [place] FDA and  has been authorized for detection and/or diagnosis of SARS-CoV-2 by FDA under an Emergency Use Authorization (EUA). This EUA will remain  in effect (meaning this test can be used) for the duration of the COVID-19 declaration under Section 564(b)(1) of the Act, 21 U.S.C.section 360bbb-3(b)(1), unless the authorization is terminated  or revoked sooner.       Influenza A by PCR 10/04/2021 NEGATIVE  NEGATIVE Final   Influenza B by PCR 10/04/2021 NEGATIVE  NEGATIVE Final   Comment: (NOTE) The Xpert Xpress SARS-CoV-2/FLU/RSV plus assay is intended as an aid in the diagnosis of influenza from  Nasopharyngeal swab specimens and should not be used as a sole basis for treatment. Nasal washings and aspirates are unacceptable for Xpert Xpress SARS-CoV-2/FLU/RSV testing.  Fact Sheet for Patients: EntrepreneurPulse.com.au  Fact Sheet for Healthcare Providers: IncredibleEmployment.be  This test is not yet approved or cleared by the [place] FDA and has been authorized for detection and/or diagnosis of SARS-CoV-2 by FDA under an Emergency Use Authorization (EUA). This EUA will remain in effect (meaning this test can be used) for the duration of the COVID-19 declaration under Section 564(b)(1) of the Act, 21 U.S.C. section 360bbb-3(b)(1), unless the authorization is terminated or revoked.  Performed at [facility], [place] [address], [place], [place] [address]    WBC 10/04/2021 8.4  4.0 - 10.5 K/uL Final   RBC 10/04/2021 4.42  3.87 - 5.11 MIL/uL Final   Hemoglobin 10/04/2021 11.6 (L)  12.0 - 15.0 g/dL Final   HCT 10/04/2021 35.7 (L)  36.0 - 46.0 % Final   MCV 10/04/2021 80.8  80.0 - 100.0 fL Final   MCH 10/04/2021 26.2  26.0 - 34.0 pg Final   MCHC 10/04/2021 32.5  30.0 - 36.0 g/dL Final   RDW 10/04/2021  16.0 (H)  11.5 - 15.5 % Final   Platelets 10/04/2021 372  150 - 400 K/uL Final   nRBC 10/04/2021 0.0  0.0 - 0.2 % Final   Neutrophils Relative % 10/04/2021 68  % Final   Neutro Abs 10/04/2021 5.7  1.7 - 7.7 K/uL Final   Lymphocytes Relative 10/04/2021 22  % Final   Lymphs Abs 10/04/2021 1.8  0.7 - 4.0 K/uL Final   Monocytes Relative 10/04/2021 8  % Final   Monocytes Absolute 10/04/2021 0.7  0.1 - 1.0 K/uL Final   Eosinophils Relative 10/04/2021 1  % Final   Eosinophils Absolute 10/04/2021 0.1  0.0 - 0.5 K/uL Final   Basophils Relative 10/04/2021 1  % Final   Basophils Absolute 10/04/2021 0.1  0.0 - 0.1 K/uL Final   Immature Granulocytes 10/04/2021 0  % Final   Abs Immature Granulocytes 10/04/2021 0.03  0.00 - 0.07 K/uL Final    Performed at [facility], [place] [address], [place], [place] [address]   Sodium 10/04/2021 136  135 - 145 mmol/L Final   Potassium 10/04/2021 4.2  3.5 - 5.1 mmol/L Final   Chloride 10/04/2021 104  98 - 111 mmol/L Final   CO2 10/04/2021 25  22 - 32 mmol/L Final   Glucose, Bld 10/04/2021 100 (H)  70 - 99 mg/dL Final   Glucose reference range applies only to samples taken after fasting for at least 8 hours.   BUN 10/04/2021 9  6 - 20 mg/dL Final   Creatinine, Ser 10/04/2021 0.52  0.44 - 1.00 mg/dL Final   Calcium 10/04/2021 9.0  8.9 - 10.3 mg/dL Final   Total Protein 10/04/2021 7.0  6.5 - 8.1 g/dL Final   Albumin 10/04/2021 3.2 (L)  3.5 - 5.0 g/dL Final   AST 10/04/2021 13 (L)  15 - 41 U/L Final   ALT 10/04/2021 10  0 - 44 U/L Final   Alkaline Phosphatase 10/04/2021 61  38 - 126 U/L Final   Total Bilirubin 10/04/2021 0.3  0.3 - 1.2 mg/dL Final   GFR, Estimated 10/04/2021 >60  >60 mL/min Final   Comment: (NOTE) Calculated using the CKD-EPI Creatinine Equation (2021)    Anion gap 10/04/2021 7  5 - 15 Final   Performed at [facility] [address], [place], [place] [address]   Hgb A1c MFr Bld 10/04/2021 7.0 (H)  4.8 - 5.6 % Final   Comment: (NOTE) Pre diabetes:          5.7%-6.4%  Diabetes:              >6.4%  Glycemic control for   <7.0% adults with diabetes    Mean Plasma Glucose 10/04/2021 154.2  mg/dL Final   Performed at [facility], [place] [address] [place], [place] [address]   Cholesterol 10/04/2021 178  0 - 200 mg/dL Final   Triglycerides 10/04/2021 155 (H)  <150 mg/dL Final   HDL 10/04/2021 52  >40 mg/dL Final   Total CHOL/HDL Ratio 10/04/2021 3.4  RATIO Final   VLDL 10/04/2021 31  0 - 40 mg/dL Final   LDL Cholesterol 10/04/2021 95  0 - 99 mg/dL Final   Comment:        Total Cholesterol/HDL:CHD Risk Coronary Heart Disease Risk Table                     Men   Women  1/2 Average Risk   3.4   3.3  Average Risk       5.0   4.4  2 X Average Risk   9.6    7.1  3 X Average Risk  23.4   11.0        Use the calculated Patient Ratio above and the CHD Risk Table to determine the patient's CHD Risk.        ATP III CLASSIFICATION (LDL):  <100     mg/dL   Optimal  100-129  mg/dL   Near or Above                    Optimal  130-159  mg/dL   Borderline  160-189  mg/dL   High  >190     mg/dL   Very High Performed at [facility] [address] [place], [place] [address]    POC Amphetamine UR 10/04/2021 None Detected  NONE DETECTED (Cut Off Level 1000 ng/mL) Final   POC Secobarbital (BAR) 10/04/2021 None Detected  NONE DETECTED (Cut Off Level 300 ng/mL) Final   POC Buprenorphine (BUP) 10/04/2021 None Detected  NONE DETECTED (Cut Off Level 10 ng/mL) Final   POC Oxazepam (BZO) 10/04/2021 None Detected  NONE DETECTED (Cut Off Level 300 ng/mL) Final   POC Cocaine UR 10/04/2021 None Detected  NONE DETECTED (Cut Off Level 300 ng/mL) Final   POC Methamphetamine UR 10/04/2021 None Detected  NONE DETECTED (Cut Off Level 1000 ng/mL) Final   POC Morphine 10/04/2021 None Detected  NONE DETECTED (Cut Off Level 300 ng/mL) Final   POC Methadone UR 10/04/2021 None Detected  NONE DETECTED (Cut Off Level 300 ng/mL) Final   POC Oxycodone UR 10/04/2021 Positive (A)  NONE DETECTED (Cut Off Level 100 ng/mL) Final   POC Marijuana UR 10/04/2021 None Detected  NONE DETECTED (Cut Off Level 50 ng/mL) Final   SARSCOV2ONAVIRUS 2 AG 10/04/2021 NEGATIVE  NEGATIVE Final   Comment: (NOTE) SARS-CoV-2 antigen NOT DETECTED.   Negative results are presumptive.  Negative results do not preclude SARS-CoV-2 infection and should not be used as the sole basis for treatment or other patient management decisions, including infection  control decisions, particularly in the presence of clinical signs and  symptoms consistent with COVID-19, or in those who have been in contact with the virus.  Negative results must be combined with clinical observations, patient history, and  epidemiological information. The expected result is Negative.  Fact Sheet for Patients: HandmadeRecipes.com.cy  Fact Sheet for Healthcare Providers: FuneralLife.at  This test is not yet approved or cleared by the [place] FDA and  has been authorized for detection and/or diagnosis of SARS-CoV-2 by FDA under an Emergency Use Authorization (EUA).  This EUA will remain in effect (meaning this test can be used) for the duration of  the COV                          ID-19 declaration under Section 564(b)(1) of the Act, 21 U.S.C. section 360bbb-3(b)(1), unless the authorization is terminated or revoked sooner.     Valproic Acid Lvl 10/04/2021 51  50.0 - 100.0 ug/mL Final   Performed at [facility] [address], [place], [place] [address]   Valproic Acid Lvl 10/08/2021 57  50.0 - 100.0 ug/mL Final   Performed at [facility] [address], [place], [place] [address]   Glucose-Capillary 10/09/2021 123 (H)  70 - 99 mg/dL Final   Glucose reference range applies  only to samples taken after fasting for at least 8 hours.  Admission on 09/09/2021, Discharged on 09/10/2021  Component Date Value Ref Range Status   Sodium 09/09/2021 134 (L)  135 - 145 mmol/L Final   Potassium 09/09/2021 4.3  3.5 - 5.1 mmol/L Final   Chloride 09/09/2021 99  98 - 111 mmol/L Final   CO2 09/09/2021 25  22 - 32 mmol/L Final   Glucose, Bld 09/09/2021 107 (H)  70 - 99 mg/dL Final   Glucose reference range applies only to samples taken after fasting for at least 8 hours.   BUN 09/09/2021 12  6 - 20 mg/dL Final   Creatinine, Ser 09/09/2021 0.74  0.44 - 1.00 mg/dL Final   Calcium 09/09/2021 9.0  8.9 - 10.3 mg/dL Final   Total Protein 09/09/2021 7.4  6.5 - 8.1 g/dL Final   Albumin 09/09/2021 3.1 (L)  3.5 - 5.0 g/dL Final   AST 09/09/2021 13 (L)  15 - 41 U/L Final   ALT 09/09/2021 13  0 - 44 U/L Final   Alkaline Phosphatase 09/09/2021 66  38 - 126 U/L  Final   Total Bilirubin 09/09/2021 0.2 (L)  0.3 - 1.2 mg/dL Final   GFR, Estimated 09/09/2021 >60  >60 mL/min Final   Comment: (NOTE) Calculated using the CKD-EPI Creatinine Equation (2021)    Anion gap 09/09/2021 10  5 - 15 Final   Performed at [facility], [place] [address], [place], [place] [address]   WBC 09/09/2021 8.5  4.0 - 10.5 K/uL Final   RBC 09/09/2021 4.53  3.87 - 5.11 MIL/uL Final   Hemoglobin 09/09/2021 11.8 (L)  12.0 - 15.0 g/dL Final   HCT 09/09/2021 38.0  36.0 - 46.0 % Final   MCV 09/09/2021 83.9  80.0 - 100.0 fL Final   MCH 09/09/2021 26.0  26.0 - 34.0 pg Final   MCHC 09/09/2021 31.1  30.0 - 36.0 g/dL Final   RDW 09/09/2021 17.8 (H)  11.5 - 15.5 % Final   Platelets 09/09/2021 442 (H)  150 - 400 K/uL Final   nRBC 09/09/2021 0.0  0.0 - 0.2 % Final   Neutrophils Relative % 09/09/2021 70  % Final   Neutro Abs 09/09/2021 5.9  1.7 - 7.7 K/uL Final   Lymphocytes Relative 09/09/2021 20  % Final   Lymphs Abs 09/09/2021 1.7  0.7 - 4.0 K/uL Final   Monocytes Relative 09/09/2021 8  % Final   Monocytes Absolute 09/09/2021 0.7  0.1 - 1.0 K/uL Final   Eosinophils Relative 09/09/2021 1  % Final   Eosinophils Absolute 09/09/2021 0.1  0.0 - 0.5 K/uL Final   Basophils Relative 09/09/2021 1  % Final   Basophils Absolute 09/09/2021 0.1  0.0 - 0.1 K/uL Final   Immature Granulocytes 09/09/2021 0  % Final   Abs Immature Granulocytes 09/09/2021 0.03  0.00 - 0.07 K/uL Final   Performed at [facility], [place] [address] [place], [place] [address]   Ammonia 09/09/2021 37 (H)  9 - 35 umol/L Final   Comment: HEMOLYSIS AT THIS LEVEL MAY AFFECT RESULT Performed at [facility], [place] [address] [place], [place] [address]    Opiates 09/09/2021 NONE DETECTED  NONE DETECTED Final   Cocaine 09/09/2021 NONE DETECTED  NONE DETECTED Final   Benzodiazepines 09/09/2021 NONE DETECTED  NONE DETECTED Final   Amphetamines 09/09/2021 NONE DETECTED  NONE DETECTED Final   Tetrahydrocannabinol  09/09/2021 NONE DETECTED  NONE DETECTED Final  Barbiturates 09/09/2021 NONE DETECTED  NONE DETECTED Final   Comment: (NOTE) DRUG SCREEN FOR MEDICAL PURPOSES ONLY.  IF CONFIRMATION IS NEEDED FOR ANY PURPOSE, NOTIFY LAB WITHIN 5 DAYS.  LOWEST DETECTABLE LIMITS FOR URINE DRUG SCREEN Drug Class                     Cutoff (ng/mL) Amphetamine and metabolites    1000 Barbiturate and metabolites    200 Benzodiazepine                 426 Tricyclics and metabolites     300 Opiates and metabolites        300 Cocaine and metabolites        300 THC                            50 Performed at [facility], [place] [address], [place], [place] [address]    Alcohol, Ethyl (B) 09/09/2021 <10  <10 mg/dL Final   Comment: (NOTE) Lowest detectable limit for serum alcohol is 10 mg/dL.  For medical purposes only. Performed at [facility], [place] [address] [place], [place] [address]    Lipase 09/09/2021 33  11 - 51 U/L Final   Performed at [facility] [address] [place], [place] [address]   Color, Urine 09/09/2021 COLORLESS (A)  YELLOW Final   APPearance 09/09/2021 CLEAR  CLEAR Final   Specific Gravity, Urine 09/09/2021 1.002 (L)  1.005 - 1.030 Final   pH 09/09/2021 6.0  5.0 - 8.0 Final   Glucose, UA 09/09/2021 NEGATIVE  NEGATIVE mg/dL Final   Hgb urine dipstick 09/09/2021 NEGATIVE  NEGATIVE Final   Bilirubin Urine 09/09/2021 NEGATIVE  NEGATIVE Final   Ketones, ur 09/09/2021 NEGATIVE  NEGATIVE mg/dL Final   Protein, ur 09/09/2021 NEGATIVE  NEGATIVE mg/dL Final   Nitrite 09/09/2021 NEGATIVE  NEGATIVE Final   Leukocytes,Ua 09/09/2021 NEGATIVE  NEGATIVE Final   Performed at [facility] [address], [place], [place] [address]   Specimen Description 09/09/2021 URINE, CLEAN CATCH   Final   Special Requests 09/09/2021 NONE   Final   Culture 09/09/2021  (A)   Final                   Value:<10,000 COLONIES/mL INSIGNIFICANT GROWTH Performed at [facility],  [place] [address], [place], [place] [address]    Report Status 09/09/2021 09/10/2021 FINAL   Final   D-Dimer, Quant 09/09/2021 0.92 (H)  0.00 - 0.50 ug/mL-FEU Final   Comment: (NOTE) At the manufacturer cut-off value of 0.5 g/mL FEU, this assay has a negative predictive value of 95-100%.This assay is intended for use in conjunction with a clinical pretest probability (PTP) assessment model to exclude pulmonary embolism (PE) and deep venous thrombosis (DVT) in outpatients suspected of PE or DVT. Results should be correlated with clinical presentation. Performed at [facility], [place] [address] [place], [place] [address]    Troponin I (High Sensitivity) 09/09/2021 5  <18 ng/L Final   Comment: (NOTE) Elevated high sensitivity troponin I (hsTnI) values and significant  changes across serial measurements may suggest ACS but many other  chronic and acute conditions are known to elevate hsTnI results.  Refer to the "Links" section for chest pain algorithms and additional  guidance. Performed at [facility], [place] [address] [place], [place] [place] [id number]    BP  09/10/2021 135/83  mmHg Final   S' Lateral 09/10/2021 2.30  cm Final   Area-P 1/2 09/10/2021 5.58  cm2 Final    Blood Alcohol level:  Lab Results  Component Value Date   ETH <10 11/23/2021   ETH <10 56/38/7564    Metabolic Disorder Labs: Lab Results  Component Value Date   HGBA1C 6.7 (H) 12/28/2021   MPG 145.59 12/28/2021   MPG 154.2 10/04/2021   Lab Results  Component Value Date   PROLACTIN 3.8 (L) 11/23/2021   Lab Results  Component Value Date   CHOL 171 11/23/2021   TRIG 125 11/23/2021   HDL 65 11/23/2021   CHOLHDL 2.6 11/23/2021   VLDL 25 11/23/2021   LDLCALC 81 11/23/2021   [place] 95 10/04/2021    Therapeutic Lab Levels: No results found for: "LITHIUM" Lab Results  Component Value Date   VALPROATE 33 (L) 12/28/2021   VALPROATE 54 12/03/2021   No results found for: "CBMZ"  Physical Findings    PHQ2-9    Flowsheet Row ED from 11/23/2021 in [facility]  PHQ-2 Total Score 2  PHQ-9 Total Score 3      Flowsheet Row ED from 11/23/2021 in [facility] Most recent reading at 11/23/2021  6:44 PM ED from 11/22/2021 in [place] [facility] Most recent reading at 11/22/2021  8:28 PM ED from 11/22/2021 in [place] [facility] Most recent reading at 11/22/2021  1:24 AM  C-SSRS RISK CATEGORY No Risk No Risk No Risk        Musculoskeletal  Strength & Muscle Tone: within normal limits Gait & Station: normal Patient leans: N/A  Psychiatric Specialty Exam  Presentation  General Appearance:  Appropriate for Environment; Casual  Eye Contact: Good  Speech: Clear and Coherent; Normal Rate  Speech Volume: Normal  Handedness: Right   Mood and Affect  Mood: Euthymic  Affect: Congruent   Thought Process  Thought Processes: Coherent  Descriptions of Associations:Intact  Orientation:Full (Time, Place and Person)  Thought Content:Logical  Diagnosis of Schizophrenia or Schizoaffective disorder in past: Yes  Duration of Psychotic Symptoms: No data recorded  Hallucinations:Hallucinations: None  Ideas of Reference:None  Suicidal Thoughts:Suicidal Thoughts: No  Homicidal Thoughts:Homicidal Thoughts: No   Sensorium  Memory: Immediate Fair; Recent Fair; Remote Fair  Judgment: Fair  Insight: Fair   Community education officer  Concentration: Good  Attention Span: Good  Recall: [place] of Knowledge: Good  Language: Good   Psychomotor Activity  Psychomotor Activity: Psychomotor Activity: Normal   Assets  Assets: Social Support; Resilience; Physical Health   Sleep  Sleep: Sleep: Poor Number of Hours of Sleep: 5   No data recorded  Physical Exam  Physical Exam Vitals and nursing note reviewed.  Constitutional:      General: She is  not in acute distress.    Appearance: Normal appearance. She is not ill-appearing.  HENT:     Head: Normocephalic.  Eyes:     General:        Right eye: No discharge.        Left eye: No discharge.     Conjunctiva/sclera: Conjunctivae normal.  Cardiovascular:     Rate and Rhythm: Normal rate.  Pulmonary:     Effort: Pulmonary effort is normal. No respiratory distress.  Musculoskeletal:        General: Normal range of motion.     Cervical back: Normal range of motion.  Skin:    Coloration: Skin  is not jaundiced or pale.  Neurological:     Mental Status: She is alert and oriented to person, place, and time.  Psychiatric:        Attention and Perception: Attention and perception normal.        Mood and Affect: Mood and affect normal.        Speech: Speech normal.        Behavior: Behavior normal. Behavior is cooperative.        Thought Content: Thought content normal.        Cognition and Memory: Cognition normal.        Judgment: Judgment normal.    Review of Systems  Constitutional: Negative.   HENT: Negative.    Eyes: Negative.   Respiratory: Negative.    Cardiovascular: Negative.   Musculoskeletal: Negative.   Skin: Negative.   Neurological: Negative.   Psychiatric/Behavioral: Negative.     Blood pressure 130/74, pulse (!) 119, temperature (!) 97.5 F (36.4 C), temperature source Oral, resp. rate 18, SpO2 97 %. There is no height or weight on file to calculate BMI.  Treatment Plan Summary: copied from Darrol Angel NP note 01/17/2022, plan has not changed.   "Patient has been psychiatrically cleared and does not meet criteria for inpatient psychiatric treatment. She is unable to return to group home unless she has been assigned a guardian. She is unable to care for herself and has been boarding on the continuous assessment unit while awaiting DSS caseworker to find appropriate placement. Social work/TOC working with DSS to find placement. Per Boris Lown, LCSW, Per  Kevin Fenton, [facility] social work supervisor, their attorney determined the patient has the capacity to make placement decisions and they will not be seeking guardianship.    Per chart review, AID to Capacity Evaluation (ACE) completed by Dr. Louis Meckel, Dr. Dwyane Dee on 12/11/2021.  Please see media tab for full details.    [name], PhD completed: Wechsler Adult Intelligence Scale-4, Ms. 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  Valproic acid level obtained.   Revonda Humphrey, NP 01/18/2022 6:34 PM

## 2022-01-18 NOTE — Progress Notes (Signed)
Brief note:  [facility]  11/17 ANC = 3200 ANC monitoring frequency weekly  Next ANC due 11/24  Thanks!  Lorenza Evangelist 01/18/2022 8:52 AM

## 2022-01-18 NOTE — ED Notes (Signed)
Pt remains boarding AT [facility] while awaiting group home placement. She denies any acute concerns, has been pleasant and cooperative throughout shift thus far as well as prior shift. She is compliant with am medications and denies any acute concerns. She continues to hyperfocus on food and is noted to repeatedly ask for snacks. But is having regular bowel movement and is in no acute distress. Pt is safe.

## 2022-01-18 NOTE — ED Notes (Signed)
Breakfast given.  

## 2022-01-19 DIAGNOSIS — F209 Schizophrenia, unspecified: Secondary | ICD-10-CM | POA: Diagnosis not present

## 2022-01-19 LAB — GLUCOSE, CAPILLARY
Glucose-Capillary: 108 mg/dL — ABNORMAL HIGH (ref 70–99)
Glucose-Capillary: 114 mg/dL — ABNORMAL HIGH (ref 70–99)
Glucose-Capillary: 111 mg/dL — ABNORMAL HIGH (ref 70–99)

## 2022-01-19 NOTE — ED Provider Notes (Cosign Needed Addendum)
Behavioral Health Progress Note  Date and Time: 01/19/2022 1430  Name: Teresa Murillo MRN:  [id number]  Subjective:   Teresa Murillo 61 y.o., female patient who initially admitted to [facility] on 11/23/2021 with complaints of being locked out of her group home.  Patient was dismissed from group home due to multiple elopements.  Patient is psychiatrically cleared but has remained in the facility boarding while DSS and social work look for new placement.   Teresa Murillo, 61 y.o., female patient seen face to face by this provider, consulted with Dr. Lovette Cliche; and chart reviewed on 01/19/22.  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  She is currently prescribed Clozarill 50 mg daily, Clozaril 75 mg nightly, and Depakote 500 mg twice daily.   On today's evaluation Adie Vilar is observed laying in her bed talking to the patient next to her.  She is smiling and bright upon approach.  She is alert/oriented x 4, cooperative, calm, and attentive.  She has normal speech and behavior.  She is engaged throughout the assessment.  She continues to deny depression, SI/HI/AVH.  Reports she was able to get a good night sleep last night.  She is denying any concerns with appetite.  Mindie is requesting to speak with the Education officer, museum.  She is requesting that an effort be made to find a contact phone number for her sister who lives near the [place] she would like to call her sister to see if she would be able to come and live with her.     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 On the Wechsler Adult Intelligence Scale-4, Ms. Brooker achieved a full-scale IQ score of 73 and a percentile rank of 4 placing her in the borderline range of intellectual functioning.    Chronic obstructive pulmonary disease (COPD) (HCC)    Glaucoma    Hyperlipidemia    Hypertension    Iron deficiency    Schizoaffective disorder (HCC)    Type 2 diabetes mellitus (HCC)     Past Surgical History:  Procedure Laterality Date   TUBAL LIGATION     Family History:  Family History  Problem Relation Age of Onset   Breast cancer Maternal Grandmother    Family Psychiatric  History: See H&P Social History:  Social History   Substance and Sexual Activity  Alcohol Use Yes     Social History   Substance and Sexual Activity  Drug Use Not Currently    Social History   Socioeconomic History   Marital status: Divorced    Spouse name: Not on file   Number of children: Not on file   Years of education: Not on file   Highest education level: Not on file  Occupational History   Not on file  Tobacco Use   Smoking status: Every Day    Packs/day: 1.00    Types: Cigars, Cigarettes   Smokeless tobacco: Current  Vaping Use   Vaping Use: Never used  Substance and Sexual Activity   Alcohol use: Yes   Drug use: Not Currently   Sexual activity: Not Currently  Birth control/protection: Surgical  Other Topics Concern   Not on file  Social History Narrative   Not on file   Social Determinants of Health   Financial Resource Strain: Not on file  Food Insecurity: Not on file  Transportation Needs: Not on file  Physical Activity: Not on file  Stress: Not on file  Social Connections: Not on file   SDOH:  SDOH Screenings   Depression (PHQ2-9): Low Risk  (11/23/2021)  Tobacco Use: High Risk (12/30/2021)   Additional Social History:    Pain Medications: See MAR Prescriptions: See MAR Over the Counter: See MAR History of alcohol / drug use?: No  history of alcohol / drug abuse Longest period of sobriety (when/how long): N/A                    Sleep: Good  Appetite:  Good  Current Medications:  Current Facility-Administered Medications  Medication Dose Route Frequency Provider Last Rate Last Admin   acetaminophen (TYLENOL) tablet 650 mg  650 mg Oral Q6H PRN Rankin, Shuvon B, NP   650 mg at 01/19/22 2117   albuterol (VENTOLIN HFA) 108 (90 Base) MCG/ACT inhaler 2 puff  2 puff Inhalation Q6H PRN Rankin, Shuvon B, NP   2 puff at 12/09/21 2102   alum & mag hydroxide-simeth (MAALOX/MYLANTA) 200-200-20 MG/5ML suspension 30 mL  30 mL Oral Q4H PRN Rankin, Shuvon B, NP   30 mL at 12/19/21 2109   apixaban (ELIQUIS) tablet 5 mg  5 mg Oral BID Rankin, Shuvon B, NP   5 mg at 01/19/22 2117   cloZAPine (CLOZARIL) tablet 50 mg  50 mg Oral Daily [name], NP   50 mg at 01/19/22 1006   cloZAPine (CLOZARIL) tablet 75 mg  75 mg Oral QHS [name], NP   75 mg at 01/19/22 2116   diltiazem (CARDIZEM CD) 24 hr capsule 240 mg  240 mg Oral Daily Rankin, Shuvon B, NP   240 mg at 01/19/22 1006   divalproex (DEPAKOTE ER) 24 hr tablet 500 mg  500 mg Oral BID Rankin, Shuvon B, NP   500 mg at 01/19/22 2117   fluticasone furoate-vilanterol (BREO ELLIPTA) 200-25 MCG/ACT 1 puff  1 puff Inhalation Daily Rankin, Shuvon B, NP   1 puff at 01/19/22 1059   haloperidol (HALDOL) tablet 10 mg  10 mg Oral Q8H PRN Rankin, Shuvon B, NP       insulin aspart (novoLOG) injection 0-9 Units  0-9 Units Subcutaneous TID WC Tharon Aquas, NP   2 Units at 01/18/22 1648   insulin glargine-yfgn (SEMGLEE) injection 8 Units  8 Units Subcutaneous Daily Tharon Aquas, NP   8 Units at 01/19/22 1007   latanoprost (XALATAN) 0.005 % ophthalmic solution 1 drop  1 drop Both Eyes QHS Rankin, Shuvon B, NP   1 drop at 01/19/22 2120   magnesium hydroxide (MILK OF MAGNESIA) suspension 30 mL  30 mL Oral Daily PRN Rankin, Shuvon B, NP       menthol-cetylpyridinium (CEPACOL)  lozenge 3 mg  1 lozenge Oral PRN [name], NP   3 mg at 01/19/22 2116   metFORMIN (GLUCOPHAGE-XR) 24 hr tablet 1,000 mg  1,000 mg Oral BID WC [name], DO   1,000 mg at 01/19/22 1753   multivitamins with iron tablet 1 tablet  1 tablet Oral Daily [name], DO   1 tablet at 01/19/22 1006   nicotine (NICODERM CQ - dosed in mg/24 hr) patch 7 mg  7 mg Transdermal Daily Rankin, Shuvon B, NP   7 mg at 01/19/22 1006   polyethylene glycol (MIRALAX / GLYCOLAX) packet 17 g  17 g Oral Daily [name], DO   17 g at 01/19/22 1006   Vitamin D (Ergocalciferol) (DRISDOL) 1.25 MG (50000 UNIT) capsule 50,000 Units  50,000 Units Oral Q7 days Rankin, Shuvon B, NP   50,000 Units at 01/14/22 1007   Current Outpatient Medications  Medication Sig Dispense Refill   Accu-Chek Softclix Lancets lancets Use as directed up to four times daily 100 each 0   apixaban (ELIQUIS) 5 MG TABS tablet Take 1 tablet (5 mg total) by mouth 2 (two) times daily. 60 tablet 0   ARIPiprazole (ABILIFY) 10 MG tablet Take 1 tablet (10 mg total) by mouth daily. 30 tablet 0   Blood Glucose Monitoring Suppl (ACCU-CHEK GUIDE) w/Device KIT Use as directed up to four times daily 1 kit 0   budesonide-formoterol (SYMBICORT) 160-4.5 MCG/ACT inhaler Inhale 2 puffs into the lungs in the morning and at bedtime.     Cholecalciferol (VITAMIN D3) 1.25 MG (50000 UT) CAPS Take 50,000 Units by mouth every Thursday.     cloZAPine (CLOZARIL) 25 MG tablet Take 3 tablets (75 mg total) by mouth at bedtime. 90 tablet 0   clozapine (CLOZARIL) 50 MG tablet Take 1 tablet (50 mg total) by mouth daily. 30 tablet 0   diltiazem (CARDIZEM CD) 240 MG 24 hr capsule Take 1 capsule (240 mg total) by mouth daily. (Patient not taking: Reported on 11/24/2021) 30 capsule 0   divalproex (DEPAKOTE ER) 500 MG 24 hr tablet Take 1 tablet (500 mg total) by mouth 2 (two) times daily. 60 tablet 0   glucose blood test strip Use as directed up to four times daily 50 each 0    haloperidol (HALDOL) 10 MG tablet Take 1 tablet (10 mg total) by mouth 3 (three) times daily as needed for agitation (and psychotic symptoms).     INGREZZA 40 MG capsule Take 1 capsule (40 mg total) by mouth in the morning. 30 capsule 0   insulin aspart (NOVOLOG) 100 UNIT/ML FlexPen Before each meal 3 times a day, 140-199 - 2 units, 200-250 - 4 units, 251-299 - 6 units,  300-349 - 8 units,  350 or above 10 units. Insulin PEN if approved, provide syringes and needles if needed.Please switch to any approved short acting Insulin if needed. 15 mL 0   insulin glargine (LANTUS) 100 UNIT/ML Solostar Pen Inject 12 Units into the skin daily. 15 mL 0   Insulin Pen Needle 32G X 4 MM MISC Use 4 times a day with insulin, 1 month supply. 100 each 0   latanoprost (XALATAN) 0.005 % ophthalmic solution Place 1 drop into both eyes at bedtime.     metFORMIN (GLUCOPHAGE) 500 MG tablet Take 500 mg by mouth 2 (two) times daily with a meal.     nicotine (NICODERM CQ - DOSED IN MG/24 HR) 7 mg/24hr patch Place 1 patch (7 mg total) onto the skin daily. 28 patch 0   PROAIR HFA 108 (90 Base) MCG/ACT inhaler Inhale 2 puffs into the lungs every 6 (six) hours as needed for wheezing or shortness of breath.      Labs  Lab Results:  No results displayed because visit has over 200 results.    Admission on 11/22/2021, Discharged on 11/22/2021  Component Date Value Ref Range Status   Glucose-Capillary 11/22/2021 169 (H)  70 - 99 mg/dL Final  Glucose reference range applies only to samples taken after fasting for at least 8 hours.  No results displayed because visit has over 200 results.    Admission on 10/04/2021, Discharged on 10/22/2021  Component Date Value Ref Range Status   SARS Coronavirus 2 by RT PCR 10/04/2021 NEGATIVE  NEGATIVE Final   Comment: (NOTE) SARS-CoV-2 target nucleic acids are NOT DETECTED.  The SARS-CoV-2 RNA is generally detectable in upper respiratory specimens during the acute phase of infection. The  lowest concentration of SARS-CoV-2 viral copies this assay can detect is 138 copies/mL. A negative result does not preclude SARS-Cov-2 infection and should not be used as the sole basis for treatment or other patient management decisions. A negative result may occur with  improper specimen collection/handling, submission of specimen other than nasopharyngeal swab, presence of viral mutation(s) within the areas targeted by this assay, and inadequate number of viral copies(<138 copies/mL). A negative result must be combined with clinical observations, patient history, and epidemiological information. The expected result is Negative.  Fact Sheet for Patients:  EntrepreneurPulse.com.au  Fact Sheet for Healthcare Providers:  IncredibleEmployment.be  This test is no                          t yet approved or cleared by the [place] FDA and  has been authorized for detection and/or diagnosis of SARS-CoV-2 by FDA under an Emergency Use Authorization (EUA). This EUA will remain  in effect (meaning this test can be used) for the duration of the COVID-19 declaration under Section 564(b)(1) of the Act, 21 U.S.C.section 360bbb-3(b)(1), unless the authorization is terminated  or revoked sooner.       Influenza A by PCR 10/04/2021 NEGATIVE  NEGATIVE Final   Influenza B by PCR 10/04/2021 NEGATIVE  NEGATIVE Final   Comment: (NOTE) The Xpert Xpress SARS-CoV-2/FLU/RSV plus assay is intended as an aid in the diagnosis of influenza from Nasopharyngeal swab specimens and should not be used as a sole basis for treatment. Nasal washings and aspirates are unacceptable for Xpert Xpress SARS-CoV-2/FLU/RSV testing.  Fact Sheet for Patients: EntrepreneurPulse.com.au  Fact Sheet for Healthcare Providers: IncredibleEmployment.be  This test is not yet approved or cleared by the [place] FDA and has been authorized for  detection and/or diagnosis of SARS-CoV-2 by FDA under an Emergency Use Authorization (EUA). This EUA will remain in effect (meaning this test can be used) for the duration of the COVID-19 declaration under Section 564(b)(1) of the Act, 21 U.S.C. section 360bbb-3(b)(1), unless the authorization is terminated or revoked.  Performed at [facility], [place] [address] [place], [place] [address]    WBC 10/04/2021 8.4  4.0 - 10.5 K/uL Final   RBC 10/04/2021 4.42  3.87 - 5.11 MIL/uL Final   Hemoglobin 10/04/2021 11.6 (L)  12.0 - 15.0 g/dL Final   HCT 10/04/2021 35.7 (L)  36.0 - 46.0 % Final   MCV 10/04/2021 80.8  80.0 - 100.0 fL Final   MCH 10/04/2021 26.2  26.0 - 34.0 pg Final   MCHC 10/04/2021 32.5  30.0 - 36.0 g/dL Final   RDW 10/04/2021 16.0 (H)  11.5 - 15.5 % Final   Platelets 10/04/2021 372  150 - 400 K/uL Final   nRBC 10/04/2021 0.0  0.0 - 0.2 % Final   Neutrophils Relative % 10/04/2021 68  % Final   Neutro Abs 10/04/2021 5.7  1.7 - 7.7 K/uL Final   Lymphocytes Relative 10/04/2021 22  % Final  Lymphs Abs 10/04/2021 1.8  0.7 - 4.0 K/uL Final   Monocytes Relative 10/04/2021 8  % Final   Monocytes Absolute 10/04/2021 0.7  0.1 - 1.0 K/uL Final   Eosinophils Relative 10/04/2021 1  % Final   Eosinophils Absolute 10/04/2021 0.1  0.0 - 0.5 K/uL Final   Basophils Relative 10/04/2021 1  % Final   Basophils Absolute 10/04/2021 0.1  0.0 - 0.1 K/uL Final   Immature Granulocytes 10/04/2021 0  % Final   Abs Immature Granulocytes 10/04/2021 0.03  0.00 - 0.07 K/uL Final   Performed at [facility], [place] [address], [place], [place] [address]   Sodium 10/04/2021 136  135 - 145 mmol/L Final   Potassium 10/04/2021 4.2  3.5 - 5.1 mmol/L Final   Chloride 10/04/2021 104  98 - 111 mmol/L Final   CO2 10/04/2021 25  22 - 32 mmol/L Final   Glucose, Bld 10/04/2021 100 (H)  70 - 99 mg/dL Final   Glucose reference range applies only to samples taken after fasting for at least 8 hours.   BUN  10/04/2021 9  6 - 20 mg/dL Final   Creatinine, Ser 10/04/2021 0.52  0.44 - 1.00 mg/dL Final   Calcium 10/04/2021 9.0  8.9 - 10.3 mg/dL Final   Total Protein 10/04/2021 7.0  6.5 - 8.1 g/dL Final   Albumin 10/04/2021 3.2 (L)  3.5 - 5.0 g/dL Final   AST 10/04/2021 13 (L)  15 - 41 U/L Final   ALT 10/04/2021 10  0 - 44 U/L Final   Alkaline Phosphatase 10/04/2021 61  38 - 126 U/L Final   Total Bilirubin 10/04/2021 0.3  0.3 - 1.2 mg/dL Final   GFR, Estimated 10/04/2021 >60  >60 mL/min Final   Comment: (NOTE) Calculated using the CKD-EPI Creatinine Equation (2021)    Anion gap 10/04/2021 7  5 - 15 Final   Performed at [facility] [address] [place], [place] [address]   Hgb A1c MFr Bld 10/04/2021 7.0 (H)  4.8 - 5.6 % Final   Comment: (NOTE) Pre diabetes:          5.7%-6.4%  Diabetes:              >6.4%  Glycemic control for   <7.0% adults with diabetes    Mean Plasma Glucose 10/04/2021 154.2  mg/dL Final   Performed at [facility], [place] [address] [place], [place] [address]   Cholesterol 10/04/2021 178  0 - 200 mg/dL Final   Triglycerides 10/04/2021 155 (H)  <150 mg/dL Final   HDL 10/04/2021 52  >40 mg/dL Final   Total CHOL/HDL Ratio 10/04/2021 3.4  RATIO Final   VLDL 10/04/2021 31  0 - 40 mg/dL Final   LDL Cholesterol 10/04/2021 95  0 - 99 mg/dL Final   Comment:        Total Cholesterol/HDL:CHD Risk Coronary Heart Disease Risk Table                     Men   Women  1/2 Average Risk   3.4   3.3  Average Risk       5.0   4.4  2 X Average Risk   9.6   7.1  3 X Average Risk  23.4   11.0        Use the calculated Patient Ratio above and the CHD Risk Table to determine the patient's CHD Risk.  ATP III CLASSIFICATION (LDL):  <100     mg/dL   Optimal  100-129  mg/dL   Near or Above                    Optimal  130-159  mg/dL   Borderline  160-189  mg/dL   High  >190     mg/dL   Very High Performed at [facility] [address] [place], [place]  [address]    POC Amphetamine UR 10/04/2021 None Detected  NONE DETECTED (Cut Off Level 1000 ng/mL) Final   POC Secobarbital (BAR) 10/04/2021 None Detected  NONE DETECTED (Cut Off Level 300 ng/mL) Final   POC Buprenorphine (BUP) 10/04/2021 None Detected  NONE DETECTED (Cut Off Level 10 ng/mL) Final   POC Oxazepam (BZO) 10/04/2021 None Detected  NONE DETECTED (Cut Off Level 300 ng/mL) Final   POC Cocaine UR 10/04/2021 None Detected  NONE DETECTED (Cut Off Level 300 ng/mL) Final   POC Methamphetamine UR 10/04/2021 None Detected  NONE DETECTED (Cut Off Level 1000 ng/mL) Final   POC Morphine 10/04/2021 None Detected  NONE DETECTED (Cut Off Level 300 ng/mL) Final   POC Methadone UR 10/04/2021 None Detected  NONE DETECTED (Cut Off Level 300 ng/mL) Final   POC Oxycodone UR 10/04/2021 Positive (A)  NONE DETECTED (Cut Off Level 100 ng/mL) Final   POC Marijuana UR 10/04/2021 None Detected  NONE DETECTED (Cut Off Level 50 ng/mL) Final   SARSCOV2ONAVIRUS 2 AG 10/04/2021 NEGATIVE  NEGATIVE Final   Comment: (NOTE) SARS-CoV-2 antigen NOT DETECTED.   Negative results are presumptive.  Negative results do not preclude SARS-CoV-2 infection and should not be used as the sole basis for treatment or other patient management decisions, including infection  control decisions, particularly in the presence of clinical signs and  symptoms consistent with COVID-19, or in those who have been in contact with the virus.  Negative results must be combined with clinical observations, patient history, and epidemiological information. The expected result is Negative.  Fact Sheet for Patients: HandmadeRecipes.com.cy  Fact Sheet for Healthcare Providers: FuneralLife.at  This test is not yet approved or cleared by the [place] FDA and  has been authorized for detection and/or diagnosis of SARS-CoV-2 by FDA under an Emergency Use Authorization (EUA).  This EUA will remain in  effect (meaning this test can be used) for the duration of  the COV                          ID-19 declaration under Section 564(b)(1) of the Act, 21 U.S.C. section 360bbb-3(b)(1), unless the authorization is terminated or revoked sooner.     Valproic Acid Lvl 10/04/2021 51  50.0 - 100.0 ug/mL Final   Performed at [facility] [address] [place], [place] [address]   Valproic Acid Lvl 10/08/2021 57  50.0 - 100.0 ug/mL Final   Performed at [facility] [address] [place], [place] [address]   Glucose-Capillary 10/09/2021 123 (H)  70 - 99 mg/dL Final   Glucose reference range applies only to samples taken after fasting for at least 8 hours.  Admission on 09/09/2021, Discharged on 09/10/2021  Component Date Value Ref Range Status   Sodium 09/09/2021 134 (L)  135 - 145 mmol/L Final   Potassium 09/09/2021 4.3  3.5 - 5.1 mmol/L Final   Chloride 09/09/2021 99  98 - 111 mmol/L Final   CO2 09/09/2021 25  22 - 32 mmol/L Final   Glucose, Bld 09/09/2021 107 (H)  70 - 99 mg/dL Final   Glucose reference range applies only to samples taken after fasting for at least 8 hours.   BUN 09/09/2021 12  6 - 20 mg/dL Final   Creatinine, Ser 09/09/2021 0.74  0.44 - 1.00 mg/dL Final   Calcium 09/09/2021 9.0  8.9 - 10.3 mg/dL Final   Total Protein 09/09/2021 7.4  6.5 - 8.1 g/dL Final   Albumin 09/09/2021 3.1 (L)  3.5 - 5.0 g/dL Final   AST 09/09/2021 13 (L)  15 - 41 U/L Final   ALT 09/09/2021 13  0 - 44 U/L Final   Alkaline Phosphatase 09/09/2021 66  38 - 126 U/L Final   Total Bilirubin 09/09/2021 0.2 (L)  0.3 - 1.2 mg/dL Final   GFR, Estimated 09/09/2021 >60  >60 mL/min Final   Comment: (NOTE) Calculated using the CKD-EPI Creatinine Equation (2021)    Anion gap 09/09/2021 10  5 - 15 Final   Performed at [facility], [place] [address] [place], [place] [address]   WBC 09/09/2021 8.5  4.0 - 10.5 K/uL Final   RBC 09/09/2021 4.53  3.87 - 5.11 MIL/uL Final   Hemoglobin 09/09/2021  11.8 (L)  12.0 - 15.0 g/dL Final   HCT 09/09/2021 38.0  36.0 - 46.0 % Final   MCV 09/09/2021 83.9  80.0 - 100.0 fL Final   MCH 09/09/2021 26.0  26.0 - 34.0 pg Final   MCHC 09/09/2021 31.1  30.0 - 36.0 g/dL Final   RDW 09/09/2021 17.8 (H)  11.5 - 15.5 % Final   Platelets 09/09/2021 442 (H)  150 - 400 K/uL Final   nRBC 09/09/2021 0.0  0.0 - 0.2 % Final   Neutrophils Relative % 09/09/2021 70  % Final   Neutro Abs 09/09/2021 5.9  1.7 - 7.7 K/uL Final   Lymphocytes Relative 09/09/2021 20  % Final   Lymphs Abs 09/09/2021 1.7  0.7 - 4.0 K/uL Final   Monocytes Relative 09/09/2021 8  % Final   Monocytes Absolute 09/09/2021 0.7  0.1 - 1.0 K/uL Final   Eosinophils Relative 09/09/2021 1  % Final   Eosinophils Absolute 09/09/2021 0.1  0.0 - 0.5 K/uL Final   Basophils Relative 09/09/2021 1  % Final   Basophils Absolute 09/09/2021 0.1  0.0 - 0.1 K/uL Final   Immature Granulocytes 09/09/2021 0  % Final   Abs Immature Granulocytes 09/09/2021 0.03  0.00 - 0.07 K/uL Final   Performed at [facility], [place] [address] [place], [place] [address]   Ammonia 09/09/2021 37 (H)  9 - 35 umol/L Final   Comment: HEMOLYSIS AT THIS LEVEL MAY AFFECT RESULT Performed at [facility], [name] [address], [place], [place] [address]    Opiates 09/09/2021 NONE DETECTED  NONE DETECTED Final   Cocaine 09/09/2021 NONE DETECTED  NONE DETECTED Final   Benzodiazepines 09/09/2021 NONE DETECTED  NONE DETECTED Final   Amphetamines 09/09/2021 NONE DETECTED  NONE DETECTED Final   Tetrahydrocannabinol 09/09/2021 NONE DETECTED  NONE DETECTED Final   Barbiturates 09/09/2021 NONE DETECTED  NONE DETECTED Final   Comment: (NOTE) DRUG SCREEN FOR MEDICAL PURPOSES ONLY.  IF CONFIRMATION IS NEEDED FOR ANY PURPOSE, NOTIFY LAB WITHIN 5 DAYS.  LOWEST DETECTABLE LIMITS FOR URINE DRUG SCREEN Drug Class                     Cutoff (ng/mL) Amphetamine and metabolites  1000 Barbiturate and metabolites    200 Benzodiazepine                  919 Tricyclics and metabolites     300 Opiates and metabolites        300 Cocaine and metabolites        300 THC                            50 Performed at [facility], [place] [address],  [place], [place] [address]    Alcohol, Ethyl (B) 09/09/2021 <10  <10 mg/dL Final   Comment: (NOTE) Lowest detectable limit for serum alcohol is 10 mg/dL.  For medical purposes only. Performed at [facility], [place] [address], [place], [place] [address]    Lipase 09/09/2021 33  11 - 51 U/L Final   Performed at [facility] [address] [place], [place] [address]   Color, Urine 09/09/2021 COLORLESS (A)  YELLOW Final   APPearance 09/09/2021 CLEAR  CLEAR Final   Specific Gravity, Urine 09/09/2021 1.002 (L)  1.005 - 1.030 Final   pH 09/09/2021 6.0  5.0 - 8.0 Final   Glucose, UA 09/09/2021 NEGATIVE  NEGATIVE mg/dL Final   Hgb urine dipstick 09/09/2021 NEGATIVE  NEGATIVE Final   Bilirubin Urine 09/09/2021 NEGATIVE  NEGATIVE Final   Ketones, ur 09/09/2021 NEGATIVE  NEGATIVE mg/dL Final   Protein, ur 09/09/2021 NEGATIVE  NEGATIVE mg/dL Final   Nitrite 09/09/2021 NEGATIVE  NEGATIVE Final   Leukocytes,Ua 09/09/2021 NEGATIVE  NEGATIVE Final   Performed at [facility] [address] [place], [place] [address]   Specimen Description 09/09/2021 URINE, CLEAN CATCH   Final   Special Requests 09/09/2021 NONE   Final   Culture 09/09/2021  (A)   Final                   Value:<10,000 COLONIES/mL INSIGNIFICANT GROWTH Performed at [facility], [place] [address] [place], [place] [address]    Report Status 09/09/2021 09/10/2021 FINAL   Final   D-Dimer, Quant 09/09/2021 0.92 (H)  0.00 - 0.50 ug/mL-FEU Final   Comment: (NOTE) At the manufacturer cut-off value of 0.5 g/mL FEU, this assay has a negative predictive value of 95-100%.This assay is intended for use in conjunction with a clinical pretest probability (PTP) assessment model to exclude pulmonary embolism (PE) and deep  venous thrombosis (DVT) in outpatients suspected of PE or DVT. Results should be correlated with clinical presentation. Performed at [facility], [place] [address] [place], [place] [address]    Troponin I (High Sensitivity) 09/09/2021 5  <18 ng/L Final   Comment: (NOTE) Elevated high sensitivity troponin I (hsTnI) values and significant  changes across serial measurements may suggest ACS but many other  chronic and acute conditions are known to elevate hsTnI results.  Refer to the "Links" section for chest pain algorithms and additional  guidance. Performed at [facility], [place] [address] [place], [place] [address]    BP 09/10/2021 135/83  mmHg Final   S' Lateral 09/10/2021 2.30  cm Final   Area-P 1/2 09/10/2021 5.58  cm2 Final    Blood Alcohol level:  Lab Results  Component Value Date   ETH <10 11/23/2021   ETH <10 37/29/0211    Metabolic Disorder Labs: Lab Results  Component Value Date   HGBA1C 6.7 (H) 12/28/2021   MPG 145.59  12/28/2021   MPG 154.2 10/04/2021   Lab Results  Component Value Date   PROLACTIN 3.8 (L) 11/23/2021   Lab Results  Component Value Date   CHOL 171 11/23/2021   TRIG 125 11/23/2021   HDL 65 11/23/2021   CHOLHDL 2.6 11/23/2021   VLDL 25 11/23/2021   LDLCALC 81 11/23/2021   LDLCALC 95 10/04/2021    Therapeutic Lab Levels: No results found for: "LITHIUM" Lab Results  Component Value Date   VALPROATE 33 (L) 01/18/2022   VALPROATE 33 (L) 12/28/2021   No results found for: "CBMZ"  Physical Findings   PHQ2-9    [place] ED from 11/23/2021 in [facility]  PHQ-2 Total Score 2  PHQ-9 Total Score 3      Flowsheet Row ED from 11/23/2021 in [facility] Most recent reading at 11/23/2021  6:44 PM ED from 11/22/2021 in [place] [facility] Most recent reading at 11/22/2021  8:28 PM ED from 11/22/2021 in [place]  DEPT Most recent reading at 11/22/2021  1:24 AM  C-SSRS RISK CATEGORY No Risk No Risk No Risk        Musculoskeletal  Strength & Muscle Tone: within normal limits Gait & Station: normal Patient leans: N/A  Psychiatric Specialty Exam  Presentation  General Appearance:  Appropriate for Environment; Casual  Eye Contact: Good  Speech: Clear and Coherent; Normal Rate  Speech Volume: Normal  Handedness: Right   Mood and Affect  Mood: Euthymic  Affect: Congruent   Thought Process  Thought Processes: Coherent  Descriptions of Associations:Intact  Orientation:Full (Time, Place and Person)  Thought Content:Logical  Diagnosis of Schizophrenia or Schizoaffective disorder in past: Yes  Duration of Psychotic Symptoms: No data recorded  Hallucinations:Hallucinations: None  Ideas of Reference:None  Suicidal Thoughts:Suicidal Thoughts: No  Homicidal Thoughts:Homicidal Thoughts: No   Sensorium  Memory: Immediate Good; Recent Good; Remote Good  Judgment: Good  Insight: Good   Executive Functions  Concentration: Good  Attention Span: Good  Recall: Good  Fund of Knowledge: Good  Language: Good   Psychomotor Activity  Psychomotor Activity: Psychomotor Activity: Normal   Assets  Assets: Communication Skills; Desire for Improvement; Physical Health; Resilience   Sleep  Sleep: Sleep: Good Number of Hours of Sleep: 5   No data recorded  Physical Exam  Physical Exam Vitals and nursing note reviewed.  Constitutional:      General: She is not in acute distress.    Appearance: Normal appearance. She is not ill-appearing.  HENT:     Head: Normocephalic.  Eyes:     General:        Right eye: No discharge.        Left eye: No discharge.     Conjunctiva/sclera: Conjunctivae normal.     Pupils: Pupils are equal, round, and reactive to light.  Cardiovascular:     Rate and Rhythm: Normal rate.  Pulmonary:     Effort: Pulmonary effort  is normal.  Musculoskeletal:        General: Normal range of motion.     Cervical back: Normal range of motion.  Skin:    General: Skin is warm and dry.     Coloration: Skin is not jaundiced or pale.  Neurological:     Mental Status: She is alert and oriented to person, place, and time.  Psychiatric:        Attention and Perception: Attention and perception normal.  Mood and Affect: Mood and affect normal.        Speech: Speech normal.        Behavior: Behavior normal. Behavior is cooperative.        Thought Content: Thought content normal.        Cognition and Memory: Cognition normal.        Judgment: Judgment normal.    Review of Systems  Constitutional: Negative.   HENT: Negative.    Eyes: Negative.   Respiratory: Negative.    Cardiovascular: Negative.   Musculoskeletal: Negative.   Skin: Negative.   Neurological: Negative.   Psychiatric/Behavioral: Negative.     Blood pressure 132/72, pulse 100, temperature 98.2 F (36.8 C), temperature source Oral, resp. rate 20, SpO2 96 %. There is no height or weight on file to calculate BMI.  Treatment Plan Summary: Patient continues to remain psychiatrically cleared and does not meet criteria for inpatient psychiatric treatment.  SW continues to see placement. No updates at this time.   Of note:  Per Boris Lown, LCSW, Per Kevin Fenton, [facility] social work supervisor, their attorney determined the patient has the capacity to make placement decisions and they will not be seeking guardianship.   Per chart review, AID to Capacity Evaluation (ACE) completed by Dr. Louis Meckel, Dr. Dwyane Dee on 12/11/2021.  Please see media tab for full details.    [name], PhD completed: Wechsler Adult Intelligence Scale-4, Ms. Onstott achieved a full-scale IQ score of 73 and a percentile rank of 4 placing her in the borderline range of intellectual functioning (12/14/2021) Please see consult note from [name], PhD on 12/14/2021".    Revonda Humphrey, NP 01/19/2022 10:32 PM

## 2022-01-19 NOTE — ED Notes (Signed)
Pt A&O x 4, no distress noted, awake & resting at present.  Monitoring for safety.

## 2022-01-19 NOTE — ED Notes (Signed)
Pt awake & resting at present, no distress noted.  Monitoring for safety. 

## 2022-01-19 NOTE — ED Notes (Signed)
Pt sleeping at present, no distress noted.  Monitoring for safety. 

## 2022-01-19 NOTE — Progress Notes (Addendum)
Received Teresa Murillo this AM asleep in her char bed, she woke up, BS checked and received breakfast. Later she was compliant with her medications. She is pleasant, social and denied all of the psychiatric symptoms except depression. She feels depressed related to her length of stay here and her upcoming [birth year] on February 13, 2021

## 2022-01-20 DIAGNOSIS — F209 Schizophrenia, unspecified: Secondary | ICD-10-CM | POA: Diagnosis not present

## 2022-01-20 LAB — GLUCOSE, CAPILLARY
Glucose-Capillary: 129 mg/dL — ABNORMAL HIGH (ref 70–99)
Glucose-Capillary: 125 mg/dL — ABNORMAL HIGH (ref 70–99)
Glucose-Capillary: 253 mg/dL — ABNORMAL HIGH (ref 70–99)
Glucose-Capillary: 149 mg/dL — ABNORMAL HIGH (ref 70–99)

## 2022-01-20 NOTE — ED Notes (Signed)
Pt accepted scheduled PO meds and insulin w/o difficulty. Pt converses with other patients and staff. Pt is currently sitting on the pull out chair/bed in no distress. Safety maintained and will continue to monitor.

## 2022-01-20 NOTE — ED Notes (Signed)
Pt sleeping at present, no distress noted.  Monitoring for safety. 

## 2022-01-20 NOTE — ED Provider Notes (Signed)
Behavioral Health Progress Note  Date and Time: 01/20/2022 10:14 AM Name: Teresa Murillo MRN:  [id number]  Subjective:   Teresa Murillo is a 61 y.o. female with a past psychiatric history of schizophrenia, aggressive behavior, and possible intellectual disability presenting to [facility] on 11/23/21 voluntarily as a walk in via [facility] with complaints that she was locked out of the group home. Patient has been dismissed from group home due to multiple elopements. Pt had already been discharged from this facility but had been living there temporarily while DSS was looking for new placement. Pt is boarding at [facility].   AID to Capacity Evaluation (ACE) completed by Dr. Louis Meckel, Dr. Dwyane Dee on 12/11/2021.  Please see media tab for full details.    [name], PhD completed: Wechsler Adult Intelligence Scale-4, [name] achieved a full-scale IQ score of 73 and a percentile rank of 4 placing her in the borderline range of intellectual functioning (12/14/2021) Please see consult note from [name], PhD on 12/14/2021.  On reassessment, pt is a&ox3, in no acute distress, non-toxic appearing. She is lying down on bed. She denies any psychiatric or physical complaints today. She denies SI/VI/HI, AVH, paranoia. When asked how she is spending the time on the unit, she states she is walking around at intervals. She requests to speak with the Education officer, museum. Discussed will reach out to social work to speak with pt today. Objectively, there is no evidence of internal preoccupation, agitation, aggression or distractibility. No delusions or paranoia elicited. Pt remains psychiatrically cleared. Disposition is pending placement.  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 On the Wechsler Adult Intelligence Scale-4, Ms. Shanahan achieved a full-scale IQ score of 73 and a percentile rank of 4 placing her in the borderline range of intellectual functioning.    Chronic obstructive pulmonary disease (COPD) (HCC)    Glaucoma    Hyperlipidemia    Hypertension    Iron deficiency    Schizoaffective disorder (HCC)    Type 2 diabetes mellitus (HCC)     Past Surgical History:  Procedure Laterality Date   TUBAL LIGATION     Family History:  Family History  Problem Relation Age of Onset   Breast cancer Maternal Grandmother    Family Psychiatric  History: None reported Social History:  Social History   Substance and Sexual Activity  Alcohol Use Yes     Social History   Substance and Sexual Activity  Drug Use Not Currently    Social History   Socioeconomic History   Marital status: Divorced    Spouse name: Not on file   Number of children: Not on file   Years of education: Not on file   Highest education level: Not on file  Occupational History   Not on file  Tobacco Use   Smoking status: Every Day    Packs/day: 1.00    Types: Cigars, Cigarettes   Smokeless tobacco: Current  Vaping Use   Vaping Use: Never used  Substance and Sexual Activity   Alcohol use: Yes   Drug use: Not Currently   Sexual activity: Not Currently    Birth control/protection: Surgical  Other Topics Concern  Not on file  Social History Narrative   Not on file   Social Determinants of Health   Financial Resource Strain: Not on file  Food Insecurity: Not on file  Transportation Needs: Not on file  Physical Activity: Not on file  Stress: Not on file  Social Connections: Not on file   SDOH:  SDOH Screenings   Depression (PHQ2-9): Low Risk  (11/23/2021)  Tobacco Use: High Risk (12/30/2021)   Additional Social History:     Pain Medications: See MAR Prescriptions: See MAR Over the Counter: See MAR History of alcohol / drug use?: No history of alcohol / drug abuse Longest period of sobriety (when/how long): N/A                    Sleep: Good  Appetite:  Good  Current Medications:  Current Facility-Administered Medications  Medication Dose Route Frequency Provider Last Rate Last Admin   acetaminophen (TYLENOL) tablet 650 mg  650 mg Oral Q6H PRN Rankin, Shuvon B, NP   650 mg at 01/19/22 2117   albuterol (VENTOLIN HFA) 108 (90 Base) MCG/ACT inhaler 2 puff  2 puff Inhalation Q6H PRN Rankin, Shuvon B, NP   2 puff at 12/09/21 2102   alum & mag hydroxide-simeth (MAALOX/MYLANTA) 200-200-20 MG/5ML suspension 30 mL  30 mL Oral Q4H PRN Rankin, Shuvon B, NP   30 mL at 12/19/21 2109   apixaban (ELIQUIS) tablet 5 mg  5 mg Oral BID Rankin, Shuvon B, NP   5 mg at 01/20/22 0947   cloZAPine (CLOZARIL) tablet 50 mg  50 mg Oral Daily [name], NP   50 mg at 01/20/22 0946   cloZAPine (CLOZARIL) tablet 75 mg  75 mg Oral Dorie Rank, NP   75 mg at 01/19/22 2116   diltiazem (CARDIZEM CD) 24 hr capsule 240 mg  240 mg Oral Daily Rankin, Shuvon B, NP   240 mg at 01/20/22 0946   divalproex (DEPAKOTE ER) 24 hr tablet 500 mg  500 mg Oral BID Rankin, Shuvon B, NP   500 mg at 01/20/22 0947   fluticasone furoate-vilanterol (BREO ELLIPTA) 200-25 MCG/ACT 1 puff  1 puff Inhalation Daily Rankin, Shuvon B, NP   1 puff at 01/20/22 0847   haloperidol (HALDOL) tablet 10 mg  10 mg Oral Q8H PRN Rankin, Shuvon B, NP       insulin aspart (novoLOG) injection 0-9 Units  0-9 Units Subcutaneous TID WC Tharon Aquas, NP   1 Units at 01/20/22 0847   insulin glargine-yfgn (SEMGLEE) injection 8 Units  8 Units Subcutaneous Daily Tharon Aquas, NP   8 Units at 01/20/22 0946   latanoprost (XALATAN) 0.005 % ophthalmic solution 1 drop  1 drop Both Eyes QHS Rankin, Shuvon B, NP   1 drop at 01/19/22 2120   magnesium hydroxide (MILK  OF MAGNESIA) suspension 30 mL  30 mL Oral Daily PRN Rankin, Shuvon B, NP       menthol-cetylpyridinium (CEPACOL) lozenge 3 mg  1 lozenge Oral PRN [name], NP   3 mg at 01/19/22 2116   metFORMIN (GLUCOPHAGE-XR) 24 hr tablet 1,000 mg  1,000 mg Oral BID WC [name], DO   1,000 mg at 01/20/22 0847   multivitamins with iron tablet 1 tablet  1 tablet Oral Daily [name], DO   1 tablet at 01/20/22 0947   nicotine (NICODERM CQ - dosed in mg/24 hr) patch 7 mg  7 mg Transdermal Daily Rankin, Shuvon B, NP  7 mg at 01/20/22 0947   polyethylene glycol (MIRALAX / GLYCOLAX) packet 17 g  17 g Oral Daily [name], DO   17 g at 01/20/22 1062   Vitamin D (Ergocalciferol) (DRISDOL) 1.25 MG (50000 UNIT) capsule 50,000 Units  50,000 Units Oral Q7 days Rankin, Shuvon B, NP   50,000 Units at 01/14/22 1007   Current Outpatient Medications  Medication Sig Dispense Refill   Accu-Chek Softclix Lancets lancets Use as directed up to four times daily 100 each 0   apixaban (ELIQUIS) 5 MG TABS tablet Take 1 tablet (5 mg total) by mouth 2 (two) times daily. 60 tablet 0   ARIPiprazole (ABILIFY) 10 MG tablet Take 1 tablet (10 mg total) by mouth daily. 30 tablet 0   Blood Glucose Monitoring Suppl (ACCU-CHEK GUIDE) w/Device KIT Use as directed up to four times daily 1 kit 0   budesonide-formoterol (SYMBICORT) 160-4.5 MCG/ACT inhaler Inhale 2 puffs into the lungs in the morning and at bedtime.     Cholecalciferol (VITAMIN D3) 1.25 MG (50000 UT) CAPS Take 50,000 Units by mouth every Thursday.     cloZAPine (CLOZARIL) 25 MG tablet Take 3 tablets (75 mg total) by mouth at bedtime. 90 tablet 0   clozapine (CLOZARIL) 50 MG tablet Take 1 tablet (50 mg total) by mouth daily. 30 tablet 0   diltiazem (CARDIZEM CD) 240 MG 24 hr capsule Take 1 capsule (240 mg total) by mouth daily. (Patient not taking: Reported on 11/24/2021) 30 capsule 0   divalproex (DEPAKOTE ER) 500 MG 24 hr tablet Take 1 tablet (500 mg total) by mouth 2  (two) times daily. 60 tablet 0   glucose blood test strip Use as directed up to four times daily 50 each 0   haloperidol (HALDOL) 10 MG tablet Take 1 tablet (10 mg total) by mouth 3 (three) times daily as needed for agitation (and psychotic symptoms).     INGREZZA 40 MG capsule Take 1 capsule (40 mg total) by mouth in the morning. 30 capsule 0   insulin aspart (NOVOLOG) 100 UNIT/ML FlexPen Before each meal 3 times a day, 140-199 - 2 units, 200-250 - 4 units, 251-299 - 6 units,  300-349 - 8 units,  350 or above 10 units. Insulin PEN if approved, provide syringes and needles if needed.Please switch to any approved short acting Insulin if needed. 15 mL 0   insulin glargine (LANTUS) 100 UNIT/ML Solostar Pen Inject 12 Units into the skin daily. 15 mL 0   Insulin Pen Needle 32G X 4 MM MISC Use 4 times a day with insulin, 1 month supply. 100 each 0   latanoprost (XALATAN) 0.005 % ophthalmic solution Place 1 drop into both eyes at bedtime.     metFORMIN (GLUCOPHAGE) 500 MG tablet Take 500 mg by mouth 2 (two) times daily with a meal.     nicotine (NICODERM CQ - DOSED IN MG/24 HR) 7 mg/24hr patch Place 1 patch (7 mg total) onto the skin daily. 28 patch 0   PROAIR HFA 108 (90 Base) MCG/ACT inhaler Inhale 2 puffs into the lungs every 6 (six) hours as needed for wheezing or shortness of breath.      Labs  Lab Results:  No results displayed because visit has over 200 results.    Admission on 11/22/2021, Discharged on 11/22/2021  Component Date Value Ref Range Status   Glucose-Capillary 11/22/2021 169 (H)  70 - 99 mg/dL Final   Glucose reference range applies only to samples taken after  fasting for at least 8 hours.  No results displayed because visit has over 200 results.    Admission on 10/04/2021, Discharged on 10/22/2021  Component Date Value Ref Range Status   SARS Coronavirus 2 by RT PCR 10/04/2021 NEGATIVE  NEGATIVE Final   Comment: (NOTE) SARS-CoV-2 target nucleic acids are NOT DETECTED.  The  SARS-CoV-2 RNA is generally detectable in upper respiratory specimens during the acute phase of infection. The lowest concentration of SARS-CoV-2 viral copies this assay can detect is 138 copies/mL. A negative result does not preclude SARS-Cov-2 infection and should not be used as the sole basis for treatment or other patient management decisions. A negative result may occur with  improper specimen collection/handling, submission of specimen other than nasopharyngeal swab, presence of viral mutation(s) within the areas targeted by this assay, and inadequate number of viral copies(<138 copies/mL). A negative result must be combined with clinical observations, patient history, and epidemiological information. The expected result is Negative.  Fact Sheet for Patients:  EntrepreneurPulse.com.au  Fact Sheet for Healthcare Providers:  IncredibleEmployment.be  This test is no                          t yet approved or cleared by the [place] FDA and  has been authorized for detection and/or diagnosis of SARS-CoV-2 by FDA under an Emergency Use Authorization (EUA). This EUA will remain  in effect (meaning this test can be used) for the duration of the COVID-19 declaration under Section 564(b)(1) of the Act, 21 U.S.C.section 360bbb-3(b)(1), unless the authorization is terminated  or revoked sooner.       Influenza A by PCR 10/04/2021 NEGATIVE  NEGATIVE Final   Influenza B by PCR 10/04/2021 NEGATIVE  NEGATIVE Final   Comment: (NOTE) The Xpert Xpress SARS-CoV-2/FLU/RSV plus assay is intended as an aid in the diagnosis of influenza from Nasopharyngeal swab specimens and should not be used as a sole basis for treatment. Nasal washings and aspirates are unacceptable for Xpert Xpress SARS-CoV-2/FLU/RSV testing.  Fact Sheet for Patients: EntrepreneurPulse.com.au  Fact Sheet for Healthcare  Providers: IncredibleEmployment.be  This test is not yet approved or cleared by the [place] FDA and has been authorized for detection and/or diagnosis of SARS-CoV-2 by FDA under an Emergency Use Authorization (EUA). This EUA will remain in effect (meaning this test can be used) for the duration of the COVID-19 declaration under Section 564(b)(1) of the Act, 21 U.S.C. section 360bbb-3(b)(1), unless the authorization is terminated or revoked.  Performed at [facility], [place] [address], [place], [place] [address]    WBC 10/04/2021 8.4  4.0 - 10.5 K/uL Final   RBC 10/04/2021 4.42  3.87 - 5.11 MIL/uL Final   Hemoglobin 10/04/2021 11.6 (L)  12.0 - 15.0 g/dL Final   HCT 10/04/2021 35.7 (L)  36.0 - 46.0 % Final   MCV 10/04/2021 80.8  80.0 - 100.0 fL Final   MCH 10/04/2021 26.2  26.0 - 34.0 pg Final   MCHC 10/04/2021 32.5  30.0 - 36.0 g/dL Final   RDW 10/04/2021 16.0 (H)  11.5 - 15.5 % Final   Platelets 10/04/2021 372  150 - 400 K/uL Final   nRBC 10/04/2021 0.0  0.0 - 0.2 % Final   Neutrophils Relative % 10/04/2021 68  % Final   Neutro Abs 10/04/2021 5.7  1.7 - 7.7 K/uL Final   Lymphocytes Relative 10/04/2021 22  % Final   Lymphs Abs 10/04/2021 1.8  0.7 -  4.0 K/uL Final   Monocytes Relative 10/04/2021 8  % Final   Monocytes Absolute 10/04/2021 0.7  0.1 - 1.0 K/uL Final   Eosinophils Relative 10/04/2021 1  % Final   Eosinophils Absolute 10/04/2021 0.1  0.0 - 0.5 K/uL Final   Basophils Relative 10/04/2021 1  % Final   Basophils Absolute 10/04/2021 0.1  0.0 - 0.1 K/uL Final   Immature Granulocytes 10/04/2021 0  % Final   Abs Immature Granulocytes 10/04/2021 0.03  0.00 - 0.07 K/uL Final   Performed at [facility], [place] [address] [place], [place] [address]   Sodium 10/04/2021 136  135 - 145 mmol/L Final   Potassium 10/04/2021 4.2  3.5 - 5.1 mmol/L Final   Chloride 10/04/2021 104  98 - 111 mmol/L Final   CO2 10/04/2021 25  22 - 32 mmol/L Final    Glucose, Bld 10/04/2021 100 (H)  70 - 99 mg/dL Final   Glucose reference range applies only to samples taken after fasting for at least 8 hours.   BUN 10/04/2021 9  6 - 20 mg/dL Final   Creatinine, Ser 10/04/2021 0.52  0.44 - 1.00 mg/dL Final   Calcium 10/04/2021 9.0  8.9 - 10.3 mg/dL Final   Total Protein 10/04/2021 7.0  6.5 - 8.1 g/dL Final   Albumin 10/04/2021 3.2 (L)  3.5 - 5.0 g/dL Final   AST 10/04/2021 13 (L)  15 - 41 U/L Final   ALT 10/04/2021 10  0 - 44 U/L Final   Alkaline Phosphatase 10/04/2021 61  38 - 126 U/L Final   Total Bilirubin 10/04/2021 0.3  0.3 - 1.2 mg/dL Final   GFR, Estimated 10/04/2021 >60  >60 mL/min Final   Comment: (NOTE) Calculated using the CKD-EPI Creatinine Equation (2021)    Anion gap 10/04/2021 7  5 - 15 Final   Performed at [facility] [address], [place], [place] [address]   Hgb A1c MFr Bld 10/04/2021 7.0 (H)  4.8 - 5.6 % Final   Comment: (NOTE) Pre diabetes:          5.7%-6.4%  Diabetes:              >6.4%  Glycemic control for   <7.0% adults with diabetes    Mean Plasma Glucose 10/04/2021 154.2  mg/dL Final   Performed at [facility], [place] [address], [place], [place] [address]   Cholesterol 10/04/2021 178  0 - 200 mg/dL Final   Triglycerides 10/04/2021 155 (H)  <150 mg/dL Final   HDL 10/04/2021 52  >40 mg/dL Final   Total CHOL/HDL Ratio 10/04/2021 3.4  RATIO Final   VLDL 10/04/2021 31  0 - 40 mg/dL Final   LDL Cholesterol 10/04/2021 95  0 - 99 mg/dL Final   Comment:        Total Cholesterol/HDL:CHD Risk Coronary Heart Disease Risk Table                     Men   Women  1/2 Average Risk   3.4   3.3  Average Risk       5.0   4.4  2 X Average Risk   9.6   7.1  3 X Average Risk  23.4   11.0        Use the calculated Patient Ratio above and the CHD Risk Table to determine the patient's CHD Risk.        ATP III CLASSIFICATION (LDL):  <100  mg/dL   Optimal  100-129  mg/dL   Near or Above                     Optimal  130-159  mg/dL   Borderline  160-189  mg/dL   High  >190     mg/dL   Very High Performed at [facility] [address] [place], [place] [address]    POC Amphetamine UR 10/04/2021 None Detected  NONE DETECTED (Cut Off Level 1000 ng/mL) Final   POC Secobarbital (BAR) 10/04/2021 None Detected  NONE DETECTED (Cut Off Level 300 ng/mL) Final   POC Buprenorphine (BUP) 10/04/2021 None Detected  NONE DETECTED (Cut Off Level 10 ng/mL) Final   POC Oxazepam (BZO) 10/04/2021 None Detected  NONE DETECTED (Cut Off Level 300 ng/mL) Final   POC Cocaine UR 10/04/2021 None Detected  NONE DETECTED (Cut Off Level 300 ng/mL) Final   POC Methamphetamine UR 10/04/2021 None Detected  NONE DETECTED (Cut Off Level 1000 ng/mL) Final   POC Morphine 10/04/2021 None Detected  NONE DETECTED (Cut Off Level 300 ng/mL) Final   POC Methadone UR 10/04/2021 None Detected  NONE DETECTED (Cut Off Level 300 ng/mL) Final   POC Oxycodone UR 10/04/2021 Positive (A)  NONE DETECTED (Cut Off Level 100 ng/mL) Final   POC Marijuana UR 10/04/2021 None Detected  NONE DETECTED (Cut Off Level 50 ng/mL) Final   SARSCOV2ONAVIRUS 2 AG 10/04/2021 NEGATIVE  NEGATIVE Final   Comment: (NOTE) SARS-CoV-2 antigen NOT DETECTED.   Negative results are presumptive.  Negative results do not preclude SARS-CoV-2 infection and should not be used as the sole basis for treatment or other patient management decisions, including infection  control decisions, particularly in the presence of clinical signs and  symptoms consistent with COVID-19, or in those who have been in contact with the virus.  Negative results must be combined with clinical observations, patient history, and epidemiological information. The expected result is Negative.  Fact Sheet for Patients: HandmadeRecipes.com.cy  Fact Sheet for Healthcare Providers: FuneralLife.at  This test is not yet approved or cleared by the  [place] FDA and  has been authorized for detection and/or diagnosis of SARS-CoV-2 by FDA under an Emergency Use Authorization (EUA).  This EUA will remain in effect (meaning this test can be used) for the duration of  the COV                          ID-19 declaration under Section 564(b)(1) of the Act, 21 U.S.C. section 360bbb-3(b)(1), unless the authorization is terminated or revoked sooner.     Valproic Acid Lvl 10/04/2021 51  50.0 - 100.0 ug/mL Final   Performed at [facility] [address], [place], [place] [address]   Valproic Acid Lvl 10/08/2021 57  50.0 - 100.0 ug/mL Final   Performed at [facility] [address], [place], [place] [address]   Glucose-Capillary 10/09/2021 123 (H)  70 - 99 mg/dL Final   Glucose reference range applies only to samples taken after fasting for at least 8 hours.  Admission on 09/09/2021, Discharged on 09/10/2021  Component Date Value Ref Range Status   Sodium 09/09/2021 134 (L)  135 - 145 mmol/L Final   Potassium 09/09/2021 4.3  3.5 - 5.1 mmol/L Final   Chloride 09/09/2021 99  98 - 111 mmol/L Final   CO2 09/09/2021 25  22 - 32 mmol/L Final   Glucose, Bld  09/09/2021 107 (H)  70 - 99 mg/dL Final   Glucose reference range applies only to samples taken after fasting for at least 8 hours.   BUN 09/09/2021 12  6 - 20 mg/dL Final   Creatinine, Ser 09/09/2021 0.74  0.44 - 1.00 mg/dL Final   Calcium 09/09/2021 9.0  8.9 - 10.3 mg/dL Final   Total Protein 09/09/2021 7.4  6.5 - 8.1 g/dL Final   Albumin 09/09/2021 3.1 (L)  3.5 - 5.0 g/dL Final   AST 09/09/2021 13 (L)  15 - 41 U/L Final   ALT 09/09/2021 13  0 - 44 U/L Final   Alkaline Phosphatase 09/09/2021 66  38 - 126 U/L Final   Total Bilirubin 09/09/2021 0.2 (L)  0.3 - 1.2 mg/dL Final   GFR, Estimated 09/09/2021 >60  >60 mL/min Final   Comment: (NOTE) Calculated using the CKD-EPI Creatinine Equation (2021)    Anion gap 09/09/2021 10  5 - 15 Final   Performed at [facility], [place] [address] [place], [place] [address]   WBC 09/09/2021 8.5  4.0 - 10.5 K/uL Final   RBC 09/09/2021 4.53  3.87 - 5.11 MIL/uL Final   Hemoglobin 09/09/2021 11.8 (L)  12.0 - 15.0 g/dL Final   HCT 09/09/2021 38.0  36.0 - 46.0 % Final   MCV 09/09/2021 83.9  80.0 - 100.0 fL Final   MCH 09/09/2021 26.0  26.0 - 34.0 pg Final   MCHC 09/09/2021 31.1  30.0 - 36.0 g/dL Final   RDW 09/09/2021 17.8 (H)  11.5 - 15.5 % Final   Platelets 09/09/2021 442 (H)  150 - 400 K/uL Final   nRBC 09/09/2021 0.0  0.0 - 0.2 % Final   Neutrophils Relative % 09/09/2021 70  % Final   Neutro Abs 09/09/2021 5.9  1.7 - 7.7 K/uL Final   Lymphocytes Relative 09/09/2021 20  % Final   Lymphs Abs 09/09/2021 1.7  0.7 - 4.0 K/uL Final   Monocytes Relative 09/09/2021 8  % Final   Monocytes Absolute 09/09/2021 0.7  0.1 - 1.0 K/uL Final   Eosinophils Relative 09/09/2021 1  % Final   Eosinophils Absolute 09/09/2021 0.1  0.0 - 0.5 K/uL Final   Basophils Relative 09/09/2021 1  % Final   Basophils Absolute 09/09/2021 0.1  0.0 - 0.1 K/uL Final   Immature Granulocytes 09/09/2021 0  % Final   Abs Immature Granulocytes 09/09/2021 0.03  0.00 - 0.07 K/uL Final   Performed at [facility], [place] [address] [place], [place] [address]   Ammonia 09/09/2021 37 (H)  9 - 35 umol/L Final   Comment: HEMOLYSIS AT THIS LEVEL MAY AFFECT RESULT Performed at [facility], [place] [address], [place], [place] [address]    Opiates 09/09/2021 NONE DETECTED  NONE DETECTED Final   Cocaine 09/09/2021 NONE DETECTED  NONE DETECTED Final   Benzodiazepines 09/09/2021 NONE DETECTED  NONE DETECTED Final   Amphetamines 09/09/2021 NONE DETECTED  NONE DETECTED Final   Tetrahydrocannabinol 09/09/2021 NONE DETECTED  NONE DETECTED Final   Barbiturates 09/09/2021 NONE DETECTED  NONE DETECTED Final   Comment: (NOTE) DRUG SCREEN FOR MEDICAL PURPOSES ONLY.  IF CONFIRMATION IS NEEDED FOR ANY PURPOSE, NOTIFY LAB WITHIN 5 DAYS.  LOWEST DETECTABLE  LIMITS FOR URINE DRUG SCREEN Drug Class                     Cutoff (ng/mL) Amphetamine and metabolites    1000 Barbiturate and metabolites  200 Benzodiazepine                 161 Tricyclics and metabolites     300 Opiates and metabolites        300 Cocaine and metabolites        300 THC                            50 Performed at [facility], [place] [address] [place], [place] [address]    Alcohol, Ethyl (B) 09/09/2021 <10  <10 mg/dL Final   Comment: (NOTE) Lowest detectable limit for serum alcohol is 10 mg/dL.  For medical purposes only. Performed at [facility], [place] [address], [place], [place] [address]    Lipase 09/09/2021 33  11 - 51 U/L Final   Performed at [facility] [address] [place], [place] [address]   Color, Urine 09/09/2021 COLORLESS (A)  YELLOW Final   APPearance 09/09/2021 CLEAR  CLEAR Final   Specific Gravity, Urine 09/09/2021 1.002 (L)  1.005 - 1.030 Final   pH 09/09/2021 6.0  5.0 - 8.0 Final   Glucose, UA 09/09/2021 NEGATIVE  NEGATIVE mg/dL Final   Hgb urine dipstick 09/09/2021 NEGATIVE  NEGATIVE Final   Bilirubin Urine 09/09/2021 NEGATIVE  NEGATIVE Final   Ketones, ur 09/09/2021 NEGATIVE  NEGATIVE mg/dL Final   Protein, ur 09/09/2021 NEGATIVE  NEGATIVE mg/dL Final   Nitrite 09/09/2021 NEGATIVE  NEGATIVE Final   Leukocytes,Ua 09/09/2021 NEGATIVE  NEGATIVE Final   Performed at [facility] [address] [place] [address], [place], [place] [address]   Specimen Description 09/09/2021 URINE, CLEAN CATCH   Final   Special Requests 09/09/2021 NONE   Final   Culture 09/09/2021  (A)   Final                   Value:<10,000 COLONIES/mL INSIGNIFICANT GROWTH Performed at [facility], [place] [address] [place], [place] [address]    Report Status 09/09/2021 09/10/2021 FINAL   Final   D-Dimer, Quant 09/09/2021 0.92 (H)  0.00 - 0.50 ug/mL-FEU Final   Comment: (NOTE) At the manufacturer cut-off value of 0.5 g/mL FEU, this assay has a negative  predictive value of 95-100%.This assay is intended for use in conjunction with a clinical pretest probability (PTP) assessment model to exclude pulmonary embolism (PE) and deep venous thrombosis (DVT) in outpatients suspected of PE or DVT. Results should be correlated with clinical presentation. Performed at [facility], [place] [address] [place], [place] [address]    Troponin I (High Sensitivity) 09/09/2021 5  <18 ng/L Final   Comment: (NOTE) Elevated high sensitivity troponin I (hsTnI) values and significant  changes across serial measurements may suggest ACS but many other  chronic and acute conditions are known to elevate hsTnI results.  Refer to the "Links" section for chest pain algorithms and additional  guidance. Performed at [facility], [place] [address], [place], [place] [address]    BP 09/10/2021 135/83  mmHg Final   S' Lateral 09/10/2021 2.30  cm Final   Area-P 1/2 09/10/2021 5.58  cm2 Final    Blood Alcohol level:  Lab Results  Component Value Date   ETH <10 11/23/2021   ETH <10 95/28/4132    Metabolic Disorder Labs: Lab Results  Component Value Date   HGBA1C 6.7 (H) 12/28/2021   MPG 145.59 12/28/2021   MPG 154.2 10/04/2021  Lab Results  Component Value Date   PROLACTIN 3.8 (L) 11/23/2021   Lab Results  Component Value Date   CHOL 171 11/23/2021   TRIG 125 11/23/2021   HDL 65 11/23/2021   CHOLHDL 2.6 11/23/2021   VLDL 25 11/23/2021   LDLCALC 81 11/23/2021   LDLCALC 95 10/04/2021    Therapeutic Lab Levels: No results found for: "LITHIUM" Lab Results  Component Value Date   VALPROATE 33 (L) 01/18/2022   VALPROATE 33 (L) 12/28/2021   No results found for: "CBMZ"  Physical Findings   PHQ2-9    [place] [facility] from 11/23/2021 in [facility]  PHQ-2 Total Score 2  PHQ-9 Total Score 3      Flowsheet Row ED from 11/23/2021 in [facility] Most recent reading at 11/23/2021   6:44 PM ED from 11/22/2021 in [place] [facility] Most recent reading at 11/22/2021  8:28 PM ED from 11/22/2021 in [place] [facility] Most recent reading at 11/22/2021  1:24 AM  C-SSRS RISK CATEGORY No Risk No Risk No Risk        Musculoskeletal  Strength & Muscle Tone: within normal limits Gait & Station: normal Patient leans: N/A  Psychiatric Specialty Exam  Presentation  General Appearance:  Appropriate for Environment; Casual  Eye Contact: Good  Speech: Clear and Coherent; Normal Rate  Speech Volume: Normal  Handedness: Right   Mood and Affect  Mood: Euthymic  Affect: Flat   Thought Process  Thought Processes: Coherent  Descriptions of Associations:Intact  Orientation:Full (Time, Place and Person)  Thought Content:WDL  Diagnosis of Schizophrenia or Schizoaffective disorder in past: Yes  Duration of Psychotic Symptoms: No data recorded  Hallucinations:Hallucinations: None  Ideas of Reference:None  Suicidal Thoughts:Suicidal Thoughts: No  Homicidal Thoughts:Homicidal Thoughts: No   Sensorium  Memory: Immediate Good  Judgment: Intact  Insight: Present   Executive Functions  Concentration: Fair  Attention Span: Fair  Recall: [employer] of Knowledge: Fair  Language: Fair   Psychomotor Activity  Psychomotor Activity: Psychomotor Activity: Normal   Assets  Assets: Communication Skills; Desire for Improvement; Resilience   Sleep  Sleep: Sleep: Fair   No data recorded  Physical Exam  Physical Exam Constitutional:      General: She is not in acute distress.    Appearance: She is not ill-appearing, toxic-appearing or diaphoretic.  Eyes:     General: No scleral icterus. Cardiovascular:     Rate and Rhythm: Tachycardia present.  Pulmonary:     Effort: Pulmonary effort is normal. No respiratory distress.  Neurological:     Mental Status: She is alert and oriented  to person, place, and time.  Psychiatric:        Attention and Perception: Attention and perception normal.        Mood and Affect: Mood normal. Affect is flat.        Speech: Speech normal.        Behavior: Behavior normal. Behavior is cooperative.        Thought Content: Thought content normal.    Review of Systems  Constitutional:  Negative for chills and fever.  Respiratory:  Negative for shortness of breath.   Cardiovascular:  Negative for chest pain and palpitations.  Gastrointestinal:  Negative for abdominal pain.  Neurological:  Negative for headaches.   Blood pressure 135/79, pulse (!) 103, temperature 98.1 F (36.7 C), temperature source Oral, resp. rate 18, SpO2 96 %. There is no height  or weight on file to calculate BMI.  Treatment Plan Summary: Plan Pt remains psychiatrically cleared. Disposition is pending placement.  Tharon Aquas, NP 01/20/2022 10:14 AM

## 2022-01-20 NOTE — ED Notes (Signed)
Pt is in the bed resting. Respirations are even and unlabored. No acute distress noted. Will continue to monitor for safety 

## 2022-01-20 NOTE — Progress Notes (Signed)
LCSW Progress Note   LCSW spoke with Teresa Murillo earlier this morning to inform her there are no new updates regarding her disposition.  Teresa Murillo requested assistance with tracking down her sister's phone number - [name] in [place], [place].  Teresa Murillo stated that she wanted to ask her sister if she can come live with her temporarily.  LCSW contacted Ms. [name] from her previous group home, but Ms. [name] stated she did not have her sister's phone number.  LCSW used Google in an attempt to find the phone number and called one number that was listed to verify it was the number to [employer].  There was no answer.  LCSW will continue to search for this tomorrow.   Hansel Starling, MSW, LCSW [facility] [phone] phone [phone] fax

## 2022-01-21 DIAGNOSIS — F209 Schizophrenia, unspecified: Secondary | ICD-10-CM | POA: Diagnosis not present

## 2022-01-21 LAB — GLUCOSE, CAPILLARY
Glucose-Capillary: 109 mg/dL — ABNORMAL HIGH (ref 70–99)
Glucose-Capillary: 114 mg/dL — ABNORMAL HIGH (ref 70–99)
Glucose-Capillary: 148 mg/dL — ABNORMAL HIGH (ref 70–99)
Glucose-Capillary: 145 mg/dL — ABNORMAL HIGH (ref 70–99)
Glucose-Capillary: 144 mg/dL — ABNORMAL HIGH (ref 70–99)

## 2022-01-21 NOTE — ED Provider Notes (Signed)
Behavioral Health Progress Note  Date and Time: 01/21/2022 12:46 PM Name: Teresa Murillo MRN:  [id number]  Subjective:   Teresa Murillo is a 61 y.o. female with a past psychiatric history of schizophrenia, aggressive behavior, and possible intellectual disability presenting to [facility] on 11/23/21 voluntarily as a walk in via [facility] with complaints that she was locked out of the group home. Patient has been dismissed from group home due to multiple elopements. Pt had already been discharged from this facility but had been living there temporarily while DSS was looking for new placement. Pt is boarding at [facility].   AID to Capacity Evaluation (ACE) completed by Dr. Louis Meckel, Dr. Dwyane Dee on 12/11/2021.  Please see media tab for full details.    [name], PhD completed: Wechsler Adult Intelligence Scale-4, Ms. Face achieved a full-scale IQ score of 73 and a percentile rank of 4 placing her in the borderline range of intellectual functioning (12/14/2021) Please see consult note from [name], PhD on 12/14/2021.  On reassessment, pt is seen in the facility courtyard. Pt is sitting on a bench. Pt is a&ox3, in no acute distress, non-toxic appearing. She reports euthymic mood. She denies SI/VI/HI, AVH, paranoia. She denies any physical or psychiatric complaints. Objectively, there is no evidence of internal preoccupation, agitation, aggression, or distractibility. Pt is calm, cooperative, pleasant.  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 On the Wechsler Adult Intelligence Scale-4, Ms. Bedore achieved a  full-scale IQ score of 73 and a percentile rank of 4 placing her in the borderline range of intellectual functioning.    Chronic obstructive pulmonary disease (COPD) (HCC)    Glaucoma    Hyperlipidemia    Hypertension    Iron deficiency    Schizoaffective disorder (HCC)    Type 2 diabetes mellitus (HCC)     Past Surgical History:  Procedure Laterality Date   TUBAL LIGATION     Family History:  Family History  Problem Relation Age of Onset   Breast cancer Maternal Grandmother    Family Psychiatric  History: None reported Social History:  Social History   Substance and Sexual Activity  Alcohol Use Yes     Social History   Substance and Sexual Activity  Drug Use Not Currently    Social History   Socioeconomic History   Marital status: Divorced    Spouse name: Not on file   Number of children: Not on file   Years of education: Not on file   Highest education level: Not on file  Occupational History   Not on file  Tobacco Use   Smoking status: Every Day    Packs/day: 1.00    Types: Cigars, Cigarettes   Smokeless tobacco: Current  Vaping Use   Vaping Use: Never used  Substance and Sexual Activity   Alcohol use: Yes   Drug use: Not Currently   Sexual activity: Not Currently    Birth control/protection: Surgical  Other Topics Concern   Not on file  Social History Narrative   Not on file   Social Determinants of Health   Financial Resource Strain: Not on file  Food Insecurity: Not on file  Transportation Needs:  Not on file  Physical Activity: Not on file  Stress: Not on file  Social Connections: Not on file   SDOH:  SDOH Screenings   Depression (PHQ2-9): Low Risk  (11/23/2021)  Tobacco Use: High Risk (12/30/2021)   Additional Social History:    Pain Medications: See MAR Prescriptions: See MAR Over the Counter: See MAR History of alcohol / drug use?: No history of alcohol / drug abuse Longest period of sobriety (when/how long): N/A                     Sleep: Good  Appetite:  Good  Current Medications:  Current Facility-Administered Medications  Medication Dose Route Frequency Provider Last Rate Last Admin   acetaminophen (TYLENOL) tablet 650 mg  650 mg Oral Q6H PRN Rankin, Shuvon B, NP   650 mg at 01/19/22 2117   albuterol (VENTOLIN HFA) 108 (90 Base) MCG/ACT inhaler 2 puff  2 puff Inhalation Q6H PRN Rankin, Shuvon B, NP   2 puff at 12/09/21 2102   alum & mag hydroxide-simeth (MAALOX/MYLANTA) 200-200-20 MG/5ML suspension 30 mL  30 mL Oral Q4H PRN Rankin, Shuvon B, NP   30 mL at 12/19/21 2109   apixaban (ELIQUIS) tablet 5 mg  5 mg Oral BID Rankin, Shuvon B, NP   5 mg at 01/21/22 0957   cloZAPine (CLOZARIL) tablet 50 mg  50 mg Oral Daily [name], NP   50 mg at 01/21/22 0957   cloZAPine (CLOZARIL) tablet 75 mg  75 mg Oral Dorie Rank, NP   75 mg at 01/20/22 2117   diltiazem (CARDIZEM CD) 24 hr capsule 240 mg  240 mg Oral Daily Rankin, Shuvon B, NP   240 mg at 01/21/22 0958   divalproex (DEPAKOTE ER) 24 hr tablet 500 mg  500 mg Oral BID Rankin, Shuvon B, NP   500 mg at 01/21/22 0958   fluticasone furoate-vilanterol (BREO ELLIPTA) 200-25 MCG/ACT 1 puff  1 puff Inhalation Daily Rankin, Shuvon B, NP   1 puff at 01/21/22 0757   haloperidol (HALDOL) tablet 10 mg  10 mg Oral Q8H PRN Rankin, Shuvon B, NP       insulin aspart (novoLOG) injection 0-9 Units  0-9 Units Subcutaneous TID WC Tharon Aquas, NP   5 Units at 01/20/22 1635   insulin glargine-yfgn (SEMGLEE) injection 8 Units  8 Units Subcutaneous Daily Tharon Aquas, NP   8 Units at 01/21/22 1000   latanoprost (XALATAN) 0.005 % ophthalmic solution 1 drop  1 drop Both Eyes QHS Rankin, Shuvon B, NP   1 drop at 01/20/22 2120   magnesium hydroxide (MILK OF MAGNESIA) suspension 30 mL  30 mL Oral Daily PRN Rankin, Shuvon B, NP       menthol-cetylpyridinium (CEPACOL) lozenge 3 mg  1 lozenge Oral PRN [name], NP   3 mg at 01/20/22 2117   metFORMIN (GLUCOPHAGE-XR) 24  hr tablet 1,000 mg  1,000 mg Oral BID WC [name], DO   1,000 mg at 01/21/22 0756   multivitamins with iron tablet 1 tablet  1 tablet Oral Daily [name], DO   1 tablet at 01/21/22 0958   nicotine (NICODERM CQ - dosed in mg/24 hr) patch 7 mg  7 mg Transdermal Daily Rankin, Shuvon B, NP   7 mg at 01/21/22 0958   polyethylene glycol (MIRALAX / GLYCOLAX) packet 17 g  17 g Oral Daily [name], DO   17 g at 01/20/22 0948   Vitamin D (  Ergocalciferol) (DRISDOL) 1.25 MG (50000 UNIT) capsule 50,000 Units  50,000 Units Oral Q7 days Rankin, Shuvon B, NP   50,000 Units at 01/21/22 1013   Current Outpatient Medications  Medication Sig Dispense Refill   Accu-Chek Softclix Lancets lancets Use as directed up to four times daily 100 each 0   apixaban (ELIQUIS) 5 MG TABS tablet Take 1 tablet (5 mg total) by mouth 2 (two) times daily. 60 tablet 0   ARIPiprazole (ABILIFY) 10 MG tablet Take 1 tablet (10 mg total) by mouth daily. 30 tablet 0   Blood Glucose Monitoring Suppl (ACCU-CHEK GUIDE) w/Device KIT Use as directed up to four times daily 1 kit 0   budesonide-formoterol (SYMBICORT) 160-4.5 MCG/ACT inhaler Inhale 2 puffs into the lungs in the morning and at bedtime.     Cholecalciferol (VITAMIN D3) 1.25 MG (50000 UT) CAPS Take 50,000 Units by mouth every Thursday.     cloZAPine (CLOZARIL) 25 MG tablet Take 3 tablets (75 mg total) by mouth at bedtime. 90 tablet 0   clozapine (CLOZARIL) 50 MG tablet Take 1 tablet (50 mg total) by mouth daily. 30 tablet 0   diltiazem (CARDIZEM CD) 240 MG 24 hr capsule Take 1 capsule (240 mg total) by mouth daily. (Patient not taking: Reported on 11/24/2021) 30 capsule 0   divalproex (DEPAKOTE ER) 500 MG 24 hr tablet Take 1 tablet (500 mg total) by mouth 2 (two) times daily. 60 tablet 0   glucose blood test strip Use as directed up to four times daily 50 each 0   haloperidol (HALDOL) 10 MG tablet Take 1 tablet (10 mg total) by mouth 3 (three) times daily as needed for  agitation (and psychotic symptoms).     INGREZZA 40 MG capsule Take 1 capsule (40 mg total) by mouth in the morning. 30 capsule 0   insulin aspart (NOVOLOG) 100 UNIT/ML FlexPen Before each meal 3 times a day, 140-199 - 2 units, 200-250 - 4 units, 251-299 - 6 units,  300-349 - 8 units,  350 or above 10 units. Insulin PEN if approved, provide syringes and needles if needed.Please switch to any approved short acting Insulin if needed. 15 mL 0   insulin glargine (LANTUS) 100 UNIT/ML Solostar Pen Inject 12 Units into the skin daily. 15 mL 0   Insulin Pen Needle 32G X 4 MM MISC Use 4 times a day with insulin, 1 month supply. 100 each 0   latanoprost (XALATAN) 0.005 % ophthalmic solution Place 1 drop into both eyes at bedtime.     metFORMIN (GLUCOPHAGE) 500 MG tablet Take 500 mg by mouth 2 (two) times daily with a meal.     nicotine (NICODERM CQ - DOSED IN MG/24 HR) 7 mg/24hr patch Place 1 patch (7 mg total) onto the skin daily. 28 patch 0   PROAIR HFA 108 (90 Base) MCG/ACT inhaler Inhale 2 puffs into the lungs every 6 (six) hours as needed for wheezing or shortness of breath.      Labs  Lab Results:  No results displayed because visit has over 200 results.    Admission on 11/22/2021, Discharged on 11/22/2021  Component Date Value Ref Range Status   Glucose-Capillary 11/22/2021 169 (H)  70 - 99 mg/dL Final   Glucose reference range applies only to samples taken after fasting for at least 8 hours.  No results displayed because visit has over 200 results.    Admission on 10/04/2021, Discharged on 10/22/2021  Component Date Value Ref Range Status  SARS Coronavirus 2 by RT PCR 10/04/2021 NEGATIVE  NEGATIVE Final   Comment: (NOTE) SARS-CoV-2 target nucleic acids are NOT DETECTED.  The SARS-CoV-2 RNA is generally detectable in upper respiratory specimens during the acute phase of infection. The lowest concentration of SARS-CoV-2 viral copies this assay can detect is 138 copies/mL. A negative  result does not preclude SARS-Cov-2 infection and should not be used as the sole basis for treatment or other patient management decisions. A negative result may occur with  improper specimen collection/handling, submission of specimen other than nasopharyngeal swab, presence of viral mutation(s) within the areas targeted by this assay, and inadequate number of viral copies(<138 copies/mL). A negative result must be combined with clinical observations, patient history, and epidemiological information. The expected result is Negative.  Fact Sheet for Patients:  EntrepreneurPulse.com.au  Fact Sheet for Healthcare Providers:  IncredibleEmployment.be  This test is no                          t yet approved or cleared by the [place] FDA and  has been authorized for detection and/or diagnosis of SARS-CoV-2 by FDA under an Emergency Use Authorization (EUA). This EUA will remain  in effect (meaning this test can be used) for the duration of the COVID-19 declaration under Section 564(b)(1) of the Act, 21 U.S.C.section 360bbb-3(b)(1), unless the authorization is terminated  or revoked sooner.       Influenza A by PCR 10/04/2021 NEGATIVE  NEGATIVE Final   Influenza B by PCR 10/04/2021 NEGATIVE  NEGATIVE Final   Comment: (NOTE) The Xpert Xpress SARS-CoV-2/FLU/RSV plus assay is intended as an aid in the diagnosis of influenza from Nasopharyngeal swab specimens and should not be used as a sole basis for treatment. Nasal washings and aspirates are unacceptable for Xpert Xpress SARS-CoV-2/FLU/RSV testing.  Fact Sheet for Patients: EntrepreneurPulse.com.au  Fact Sheet for Healthcare Providers: IncredibleEmployment.be  This test is not yet approved or cleared by the [place] FDA and has been authorized for detection and/or diagnosis of SARS-CoV-2 by FDA under an Emergency Use Authorization (EUA). This EUA will  remain in effect (meaning this test can be used) for the duration of the COVID-19 declaration under Section 564(b)(1) of the Act, 21 U.S.C. section 360bbb-3(b)(1), unless the authorization is terminated or revoked.  Performed at [facility], [place] [address], [place], [place] [address]    WBC 10/04/2021 8.4  4.0 - 10.5 K/uL Final   RBC 10/04/2021 4.42  3.87 - 5.11 MIL/uL Final   Hemoglobin 10/04/2021 11.6 (L)  12.0 - 15.0 g/dL Final   HCT 10/04/2021 35.7 (L)  36.0 - 46.0 % Final   MCV 10/04/2021 80.8  80.0 - 100.0 fL Final   MCH 10/04/2021 26.2  26.0 - 34.0 pg Final   MCHC 10/04/2021 32.5  30.0 - 36.0 g/dL Final   RDW 10/04/2021 16.0 (H)  11.5 - 15.5 % Final   Platelets 10/04/2021 372  150 - 400 K/uL Final   nRBC 10/04/2021 0.0  0.0 - 0.2 % Final   Neutrophils Relative % 10/04/2021 68  % Final   Neutro Abs 10/04/2021 5.7  1.7 - 7.7 K/uL Final   Lymphocytes Relative 10/04/2021 22  % Final   Lymphs Abs 10/04/2021 1.8  0.7 - 4.0 K/uL Final   Monocytes Relative 10/04/2021 8  % Final   Monocytes Absolute 10/04/2021 0.7  0.1 - 1.0 K/uL Final   Eosinophils Relative 10/04/2021 1  % Final  Eosinophils Absolute 10/04/2021 0.1  0.0 - 0.5 K/uL Final   Basophils Relative 10/04/2021 1  % Final   Basophils Absolute 10/04/2021 0.1  0.0 - 0.1 K/uL Final   Immature Granulocytes 10/04/2021 0  % Final   Abs Immature Granulocytes 10/04/2021 0.03  0.00 - 0.07 K/uL Final   Performed at [facility], [place] [address], [place], [place] [address]   Sodium 10/04/2021 136  135 - 145 mmol/L Final   Potassium 10/04/2021 4.2  3.5 - 5.1 mmol/L Final   Chloride 10/04/2021 104  98 - 111 mmol/L Final   CO2 10/04/2021 25  22 - 32 mmol/L Final   Glucose, Bld 10/04/2021 100 (H)  70 - 99 mg/dL Final   Glucose reference range applies only to samples taken after fasting for at least 8 hours.   BUN 10/04/2021 9  6 - 20 mg/dL Final   Creatinine, Ser 10/04/2021 0.52  0.44 - 1.00 mg/dL Final   Calcium  10/04/2021 9.0  8.9 - 10.3 mg/dL Final   Total Protein 10/04/2021 7.0  6.5 - 8.1 g/dL Final   Albumin 10/04/2021 3.2 (L)  3.5 - 5.0 g/dL Final   AST 10/04/2021 13 (L)  15 - 41 U/L Final   ALT 10/04/2021 10  0 - 44 U/L Final   Alkaline Phosphatase 10/04/2021 61  38 - 126 U/L Final   Total Bilirubin 10/04/2021 0.3  0.3 - 1.2 mg/dL Final   GFR, Estimated 10/04/2021 >60  >60 mL/min Final   Comment: (NOTE) Calculated using the CKD-EPI Creatinine Equation (2021)    Anion gap 10/04/2021 7  5 - 15 Final   Performed at [place] [address] [place], [place] [address]   Hgb A1c MFr Bld 10/04/2021 7.0 (H)  4.8 - 5.6 % Final   Comment: (NOTE) Pre diabetes:          5.7%-6.4%  Diabetes:              >6.4%  Glycemic control for   <7.0% adults with diabetes    Mean Plasma Glucose 10/04/2021 154.2  mg/dL Final   Performed at [facility], [place] [address], [place], [place] [address]   Cholesterol 10/04/2021 178  0 - 200 mg/dL Final   Triglycerides 10/04/2021 155 (H)  <150 mg/dL Final   HDL 10/04/2021 52  >40 mg/dL Final   Total CHOL/HDL Ratio 10/04/2021 3.4  RATIO Final   VLDL 10/04/2021 31  0 - 40 mg/dL Final   LDL Cholesterol 10/04/2021 95  0 - 99 mg/dL Final   Comment:        Total Cholesterol/HDL:CHD Risk Coronary Heart Disease Risk Table                     Men   Women  1/2 Average Risk   3.4   3.3  Average Risk       5.0   4.4  2 X Average Risk   9.6   7.1  3 X Average Risk  23.4   11.0        Use the calculated Patient Ratio above and the CHD Risk Table to determine the patient's CHD Risk.        ATP III CLASSIFICATION (LDL):  <100     mg/dL   Optimal  100-129  mg/dL   Near or Above  Optimal  130-159  mg/dL   Borderline  160-189  mg/dL   High  >190     mg/dL   Very High Performed at [place] [address], [place], [place] [address]    POC Amphetamine UR 10/04/2021 None Detected  NONE DETECTED (Cut Off Level 1000 ng/mL) Final    POC Secobarbital (BAR) 10/04/2021 None Detected  NONE DETECTED (Cut Off Level 300 ng/mL) Final   POC Buprenorphine (BUP) 10/04/2021 None Detected  NONE DETECTED (Cut Off Level 10 ng/mL) Final   POC Oxazepam (BZO) 10/04/2021 None Detected  NONE DETECTED (Cut Off Level 300 ng/mL) Final   POC Cocaine UR 10/04/2021 None Detected  NONE DETECTED (Cut Off Level 300 ng/mL) Final   POC Methamphetamine UR 10/04/2021 None Detected  NONE DETECTED (Cut Off Level 1000 ng/mL) Final   POC Morphine 10/04/2021 None Detected  NONE DETECTED (Cut Off Level 300 ng/mL) Final   POC Methadone UR 10/04/2021 None Detected  NONE DETECTED (Cut Off Level 300 ng/mL) Final   POC Oxycodone UR 10/04/2021 Positive (A)  NONE DETECTED (Cut Off Level 100 ng/mL) Final   POC Marijuana UR 10/04/2021 None Detected  NONE DETECTED (Cut Off Level 50 ng/mL) Final   SARSCOV2ONAVIRUS 2 AG 10/04/2021 NEGATIVE  NEGATIVE Final   Comment: (NOTE) SARS-CoV-2 antigen NOT DETECTED.   Negative results are presumptive.  Negative results do not preclude SARS-CoV-2 infection and should not be used as the sole basis for treatment or other patient management decisions, including infection  control decisions, particularly in the presence of clinical signs and  symptoms consistent with COVID-19, or in those who have been in contact with the virus.  Negative results must be combined with clinical observations, patient history, and epidemiological information. The expected result is Negative.  Fact Sheet for Patients: HandmadeRecipes.com.cy  Fact Sheet for Healthcare Providers: FuneralLife.at  This test is not yet approved or cleared by the [place] FDA and  has been authorized for detection and/or diagnosis of SARS-CoV-2 by FDA under an Emergency Use Authorization (EUA).  This EUA will remain in effect (meaning this test can be used) for the duration of  the COV                          ID-19  declaration under Section 564(b)(1) of the Act, 21 U.S.C. section 360bbb-3(b)(1), unless the authorization is terminated or revoked sooner.     Valproic Acid Lvl 10/04/2021 51  50.0 - 100.0 ug/mL Final   Performed at [facility] [address] [place], [place] [address]   Valproic Acid Lvl 10/08/2021 57  50.0 - 100.0 ug/mL Final   Performed at [facility] [address] [place], [place] [address]   Glucose-Capillary 10/09/2021 123 (H)  70 - 99 mg/dL Final   Glucose reference range applies only to samples taken after fasting for at least 8 hours.  Admission on 09/09/2021, Discharged on 09/10/2021  Component Date Value Ref Range Status   Sodium 09/09/2021 134 (L)  135 - 145 mmol/L Final   Potassium 09/09/2021 4.3  3.5 - 5.1 mmol/L Final   Chloride 09/09/2021 99  98 - 111 mmol/L Final   CO2 09/09/2021 25  22 - 32 mmol/L Final   Glucose, Bld 09/09/2021 107 (H)  70 - 99 mg/dL Final   Glucose reference range applies only to samples taken after fasting for at least 8 hours.   BUN 09/09/2021 12  6 - 20 mg/dL Final   Creatinine, Ser 09/09/2021 0.74  0.44 - 1.00 mg/dL Final   Calcium 09/09/2021 9.0  8.9 - 10.3 mg/dL Final   Total Protein 09/09/2021 7.4  6.5 - 8.1 g/dL Final   Albumin 09/09/2021 3.1 (L)  3.5 - 5.0 g/dL Final   AST 09/09/2021 13 (L)  15 - 41 U/L Final   ALT 09/09/2021 13  0 - 44 U/L Final   Alkaline Phosphatase 09/09/2021 66  38 - 126 U/L Final   Total Bilirubin 09/09/2021 0.2 (L)  0.3 - 1.2 mg/dL Final   GFR, Estimated 09/09/2021 >60  >60 mL/min Final   Comment: (NOTE) Calculated using the CKD-EPI Creatinine Equation (2021)    Anion gap 09/09/2021 10  5 - 15 Final   Performed at [facility], [place] [address], [place], [place] [address]   WBC 09/09/2021 8.5  4.0 - 10.5 K/uL Final   RBC 09/09/2021 4.53  3.87 - 5.11 MIL/uL Final   Hemoglobin 09/09/2021 11.8 (L)  12.0 - 15.0 g/dL Final   HCT 09/09/2021 38.0  36.0 - 46.0 % Final   MCV 09/09/2021 83.9   80.0 - 100.0 fL Final   MCH 09/09/2021 26.0  26.0 - 34.0 pg Final   MCHC 09/09/2021 31.1  30.0 - 36.0 g/dL Final   RDW 09/09/2021 17.8 (H)  11.5 - 15.5 % Final   Platelets 09/09/2021 442 (H)  150 - 400 K/uL Final   nRBC 09/09/2021 0.0  0.0 - 0.2 % Final   Neutrophils Relative % 09/09/2021 70  % Final   Neutro Abs 09/09/2021 5.9  1.7 - 7.7 K/uL Final   Lymphocytes Relative 09/09/2021 20  % Final   Lymphs Abs 09/09/2021 1.7  0.7 - 4.0 K/uL Final   Monocytes Relative 09/09/2021 8  % Final   Monocytes Absolute 09/09/2021 0.7  0.1 - 1.0 K/uL Final   Eosinophils Relative 09/09/2021 1  % Final   Eosinophils Absolute 09/09/2021 0.1  0.0 - 0.5 K/uL Final   Basophils Relative 09/09/2021 1  % Final   Basophils Absolute 09/09/2021 0.1  0.0 - 0.1 K/uL Final   Immature Granulocytes 09/09/2021 0  % Final   Abs Immature Granulocytes 09/09/2021 0.03  0.00 - 0.07 K/uL Final   Performed at [facility], [place] [address] [place], [place] [address]   Ammonia 09/09/2021 37 (H)  9 - 35 umol/L Final   Comment: HEMOLYSIS AT THIS LEVEL MAY AFFECT RESULT Performed at [facility], [place] [address], [place], [place] [address]    Opiates 09/09/2021 NONE DETECTED  NONE DETECTED Final   Cocaine 09/09/2021 NONE DETECTED  NONE DETECTED Final   Benzodiazepines 09/09/2021 NONE DETECTED  NONE DETECTED Final   Amphetamines 09/09/2021 NONE DETECTED  NONE DETECTED Final   Tetrahydrocannabinol 09/09/2021 NONE DETECTED  NONE DETECTED Final   Barbiturates 09/09/2021 NONE DETECTED  NONE DETECTED Final   Comment: (NOTE) DRUG SCREEN FOR MEDICAL PURPOSES ONLY.  IF CONFIRMATION IS NEEDED FOR ANY PURPOSE, NOTIFY LAB WITHIN 5 DAYS.  LOWEST DETECTABLE LIMITS FOR URINE DRUG SCREEN Drug Class                     Cutoff (ng/mL) Amphetamine and metabolites    1000 Barbiturate and metabolites    200 Benzodiazepine                 267 Tricyclics and metabolites     300 Opiates and metabolites  300 Cocaine and  metabolites        300 THC                            50 Performed at [facility], [place] [address] [place], [place] [address]    Alcohol, Ethyl (B) 09/09/2021 <10  <10 mg/dL Final   Comment: (NOTE) Lowest detectable limit for serum alcohol is 10 mg/dL.  For medical purposes only. Performed at [facility], [place] [address] [place], [place] [address]    Lipase 09/09/2021 33  11 - 51 U/L Final   Performed at [facility] [address], [place], [place] [address]   Color, Urine 09/09/2021 COLORLESS (A)  YELLOW Final   APPearance 09/09/2021 CLEAR  CLEAR Final   Specific Gravity, Urine 09/09/2021 1.002 (L)  1.005 - 1.030 Final   pH 09/09/2021 6.0  5.0 - 8.0 Final   Glucose, UA 09/09/2021 NEGATIVE  NEGATIVE mg/dL Final   Hgb urine dipstick 09/09/2021 NEGATIVE  NEGATIVE Final   Bilirubin Urine 09/09/2021 NEGATIVE  NEGATIVE Final   Ketones, ur 09/09/2021 NEGATIVE  NEGATIVE mg/dL Final   Protein, ur 09/09/2021 NEGATIVE  NEGATIVE mg/dL Final   Nitrite 09/09/2021 NEGATIVE  NEGATIVE Final   Leukocytes,Ua 09/09/2021 NEGATIVE  NEGATIVE Final   Performed at [facility] [address] [place], [place] [address]   Specimen Description 09/09/2021 URINE, CLEAN CATCH   Final   Special Requests 09/09/2021 NONE   Final   Culture 09/09/2021  (A)   Final                   Value:<10,000 COLONIES/mL INSIGNIFICANT GROWTH Performed at [facility], [place] [address] [place], [place] [address]    Report Status 09/09/2021 09/10/2021 FINAL   Final   D-Dimer, Quant 09/09/2021 0.92 (H)  0.00 - 0.50 ug/mL-FEU Final   Comment: (NOTE) At the manufacturer cut-off value of 0.5 g/mL FEU, this assay has a negative predictive value of 95-100%.This assay is intended for use in conjunction with a clinical pretest probability (PTP) assessment model to exclude pulmonary embolism (PE) and deep venous thrombosis (DVT) in outpatients suspected of PE or DVT. Results should be correlated  with clinical presentation. Performed at [facility], [place] [address] [place], [place] [address]    Troponin I (High Sensitivity) 09/09/2021 5  <18 ng/L Final   Comment: (NOTE) Elevated high sensitivity troponin I (hsTnI) values and significant  changes across serial measurements may suggest ACS but many other  chronic and acute conditions are known to elevate hsTnI results.  Refer to the "Links" section for chest pain algorithms and additional  guidance. Performed at [facility], [place] [address], [place],  [address]    BP 09/10/2021 135/83  mmHg Final   S' Lateral 09/10/2021 2.30  cm Final   Area-P 1/2 09/10/2021 5.58  cm2 Final    Blood Alcohol level:  Lab Results  Component Value Date   ETH <10 11/23/2021   ETH <10 93/73/4287    Metabolic Disorder Labs: Lab Results  Component Value Date   HGBA1C 6.7 (H) 12/28/2021   MPG 145.59 12/28/2021   MPG 154.2 10/04/2021   Lab Results  Component Value Date   PROLACTIN 3.8 (L) 11/23/2021   Lab Results  Component Value Date   CHOL 171 11/23/2021   TRIG 125 11/23/2021   HDL 65 11/23/2021  CHOLHDL 2.6 11/23/2021   VLDL 25 11/23/2021   LDLCALC 81 11/23/2021   LDLCALC 95 10/04/2021    Therapeutic Lab Levels: No results found for: "LITHIUM" Lab Results  Component Value Date   VALPROATE 33 (L) 01/18/2022   VALPROATE 33 (L) 12/28/2021   No results found for: "CBMZ"  Physical Findings   PHQ2-9    [facility] ED from 11/23/2021 in [facility]  PHQ-2 Total Score 2  PHQ-9 Total Score 3      Flowsheet Row ED from 11/23/2021 in [facility] Most recent reading at 11/23/2021  6:44 PM ED from 11/22/2021 in [facility] Most recent reading at 11/22/2021  8:28 PM ED from 11/22/2021 in [facility] Most recent reading at 11/22/2021  1:24 AM  C-SSRS RISK CATEGORY No Risk No Risk No Risk         Musculoskeletal  Strength & Muscle Tone: within normal limits Gait & Station: normal Patient leans: N/A  Psychiatric Specialty Exam  Presentation  General Appearance:  Appropriate for Environment; Casual  Eye Contact: Good  Speech: Clear and Coherent; Normal Rate  Speech Volume: Normal  Handedness: Right   Mood and Affect  Mood: Euthymic  Affect: Flat   Thought Process  Thought Processes: Coherent  Descriptions of Associations:Intact  Orientation:Full (Time, Place and Person)  Thought Content:WDL  Diagnosis of Schizophrenia or Schizoaffective disorder in past: Yes  Duration of Psychotic Symptoms: No data recorded  Hallucinations:Hallucinations: None  Ideas of Reference:None  Suicidal Thoughts:Suicidal Thoughts: No  Homicidal Thoughts:Homicidal Thoughts: No   Sensorium  Memory: Immediate Good  Judgment: Intact  Insight: Present   Executive Functions  Concentration: Fair  Attention Span: Fair  Recall: [employer] of Knowledge: Fair  Language: Fair   Psychomotor Activity  Psychomotor Activity: Psychomotor Activity: Normal   Assets  Assets: Communication Skills; Desire for Improvement; Resilience   Sleep  Sleep: Sleep: Fair   No data recorded  Physical Exam  Physical Exam Constitutional:      General: She is not in acute distress.    Appearance: Normal appearance. She is not ill-appearing, toxic-appearing or diaphoretic.  Eyes:     General: No scleral icterus. Cardiovascular:     Rate and Rhythm: Normal rate.  Pulmonary:     Effort: Pulmonary effort is normal. No respiratory distress.  Neurological:     Mental Status: She is alert and oriented to person, place, and time.  Psychiatric:        Attention and Perception: Attention and perception normal.        Mood and Affect: Mood normal. Affect is flat.        Speech: Speech normal.        Behavior: Behavior normal. Behavior is cooperative.         Thought Content: Thought content normal.    Review of Systems  Constitutional:  Negative for chills and fever.  Respiratory:  Negative for shortness of breath.   Cardiovascular:  Negative for chest pain and palpitations.  Gastrointestinal:  Negative for abdominal pain.  Neurological:  Negative for headaches.   Blood pressure (!) 141/85, pulse 87, temperature 97.7 F (36.5 C), temperature source Oral, resp. rate 18, SpO2 97 %. There is no height or weight on file to calculate BMI.  Treatment Plan Summary: Plan Pt remains psychiatrically cleared. Disposition is pending placement.  Tharon Aquas, NP 01/21/2022 12:46 PM

## 2022-01-21 NOTE — Progress Notes (Signed)
LCSW Progress Note   LCSW briefly met with pt to provide her with a phone number that may be linked to her sister, [name].  Pt tried calling the number but did not get an answer.  Pt was encouraged to continue calling the number as the LCSW will try to find another number.    Omelia Blackwater, MSW, [place] phone [phone] fax

## 2022-01-21 NOTE — ED Notes (Signed)
Pt resting in bed. A&O x4, calm and cooperative. Denies current SI/HI/AVH. No signs of distress noted. Monitoring for safety. 

## 2022-01-21 NOTE — ED Notes (Signed)
Pt is awake and alert. Was given coffee and breakfast. She is talking with peer and is in no distress.

## 2022-01-21 NOTE — ED Notes (Signed)
Pt is sleeping. No distress noted. Will continue to monitor safety. 

## 2022-01-22 DIAGNOSIS — F209 Schizophrenia, unspecified: Secondary | ICD-10-CM | POA: Diagnosis not present

## 2022-01-22 LAB — GLUCOSE, CAPILLARY
Glucose-Capillary: 103 mg/dL — ABNORMAL HIGH (ref 70–99)
Glucose-Capillary: 137 mg/dL — ABNORMAL HIGH (ref 70–99)
Glucose-Capillary: 191 mg/dL — ABNORMAL HIGH (ref 70–99)
Glucose-Capillary: 163 mg/dL — ABNORMAL HIGH (ref 70–99)

## 2022-01-22 LAB — CBC WITH DIFFERENTIAL/PLATELET
Abs Immature Granulocytes: 0.02 10*3/uL (ref 0.00–0.07)
Basophils Absolute: 0.1 10*3/uL (ref 0.0–0.1)
Basophils Relative: 1 %
Eosinophils Absolute: 0.1 10*3/uL (ref 0.0–0.5)
Eosinophils Relative: 3 %
HCT: 39.4 % (ref 36.0–46.0)
Hemoglobin: 11.9 g/dL — ABNORMAL LOW (ref 12.0–15.0)
Immature Granulocytes: 1 %
Lymphocytes Relative: 55 %
Lymphs Abs: 2.3 10*3/uL (ref 0.7–4.0)
MCH: 24.4 pg — ABNORMAL LOW (ref 26.0–34.0)
MCHC: 30.2 g/dL (ref 30.0–36.0)
MCV: 80.9 fL (ref 80.0–100.0)
Monocytes Absolute: 0.5 10*3/uL (ref 0.1–1.0)
Monocytes Relative: 11 %
Neutro Abs: 1.2 10*3/uL — ABNORMAL LOW (ref 1.7–7.7)
Neutrophils Relative %: 29 %
Platelets: 320 10*3/uL (ref 150–400)
RBC: 4.87 MIL/uL (ref 3.87–5.11)
RDW: 17.8 % — ABNORMAL HIGH (ref 11.5–15.5)
WBC: 4.1 10*3/uL (ref 4.0–10.5)
nRBC: 0 % (ref 0.0–0.2)

## 2022-01-22 MED ORDER — DIPHENHYDRAMINE HCL 25 MG PO CAPS: 25.0000 mg | ORAL_CAPSULE | Freq: Once | ORAL | Status: DC

## 2022-01-22 MED ORDER — DIPHENHYDRAMINE HCL 25 MG PO CAPS
25.0000 mg | ORAL_CAPSULE | Freq: Once | ORAL | Status: AC | PRN
  Administered 2022-01-22: 25 mg via ORAL
  Filled 2022-01-22: qty 1

## 2022-01-22 NOTE — Progress Notes (Signed)
Brief note:  [facility]  11/24 = 1200  Will increase frequency CBC with diff to three times per week while ANC is 1000-1499.   Check next ANC on 11/27   Thanks Lorenza Evangelist 01/22/2022 2:16 PM

## 2022-01-22 NOTE — ED Notes (Signed)
Pt asleep in bed. Respirations even and unlabored. Monitoring for safety. 

## 2022-01-22 NOTE — Progress Notes (Signed)
[facility] called the client's care manager with [facility] [phone]).  [facility] left a VM asking for a returned call by tonight or on Monday.    Rhea Bleacher  [facility]

## 2022-01-22 NOTE — ED Provider Notes (Signed)
Behavioral Health Progress Note  Date and Time: 01/22/2022 10:21 AM Name: Teresa Murillo MRN:  [id number]  Subjective:   Teresa Murillo is a 61 y.o. female with a past psychiatric history of schizophrenia, aggressive behavior, and possible intellectual disability presenting to [facility] on 11/23/21 voluntarily as a walk in via [facility] with complaints that she was locked out of the group home. Patient has been dismissed from group home due to multiple elopements. Pt had already been discharged from this facility but had been living there temporarily while DSS was looking for new placement. Pt is boarding at [facility].   AID to Capacity Evaluation (ACE) completed by Dr. Louis Meckel, Dr. Dwyane Dee on 12/11/2021.  Please see media tab for full details.    [name], PhD completed: Wechsler Adult Intelligence Scale-4, Ms. Bockrath achieved a full-scale IQ score of 73 and a percentile rank of 4 placing her in the borderline range of intellectual functioning (12/14/2021) Please see consult note from [name], PhD on 12/14/2021.   On reassessment, pt is lying in bed. Pt is a&ox3, in no acute distress, non-toxic appearing. She reports euthymic mood. She denies SI/VI/HI, AVH, paranoia. She denies any physical or psychiatric complaints. Objectively, there is no evidence of internal preoccupation, agitation, aggression, or distractibility. Pt is calm, cooperative, pleasant. She states she has a possibly number for her sister and is going to try to reach out to her today.  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 On the Wechsler  Adult Intelligence Scale-4, Ms. Mccance achieved a full-scale IQ score of 73 and a percentile rank of 4 placing her in the borderline range of intellectual functioning.    Chronic obstructive pulmonary disease (COPD) (HCC)    Glaucoma    Hyperlipidemia    Hypertension    Iron deficiency    Schizoaffective disorder (HCC)    Type 2 diabetes mellitus (HCC)     Past Surgical History:  Procedure Laterality Date   TUBAL LIGATION     Family History:  Family History  Problem Relation Age of Onset   Breast cancer Maternal Grandmother    Family Psychiatric  History: None reported Social History:  Social History   Substance and Sexual Activity  Alcohol Use Yes     Social History   Substance and Sexual Activity  Drug Use Not Currently    Social History   Socioeconomic History   Marital status: Divorced    Spouse name: Not on file   Number of children: Not on file   Years of education: Not on file   Highest education level: Not on file  Occupational History   Not on file  Tobacco Use   Smoking status: Every Day    Packs/day: 1.00    Types: Cigars, Cigarettes   Smokeless tobacco: Current  Vaping Use   Vaping Use: Never used  Substance and Sexual Activity   Alcohol use: Yes   Drug use: Not Currently   Sexual activity: Not Currently    Birth control/protection: Surgical  Other Topics Concern   Not on file  Social History Narrative   Not on file   Social Determinants of Health   Financial  Resource Strain: Not on file  Food Insecurity: Not on file  Transportation Needs: Not on file  Physical Activity: Not on file  Stress: Not on file  Social Connections: Not on file   SDOH:  SDOH Screenings   Depression (PHQ2-9): Low Risk  (11/23/2021)  Tobacco Use: High Risk (12/30/2021)   Additional Social History:    Pain Medications: See MAR Prescriptions: See MAR Over the Counter: See MAR History of alcohol / drug use?: No history of alcohol / drug abuse Longest period  of sobriety (when/how long): N/A                    Sleep: Good  Appetite:  Good  Current Medications:  Current Facility-Administered Medications  Medication Dose Route Frequency Provider Last Rate Last Admin   acetaminophen (TYLENOL) tablet 650 mg  650 mg Oral Q6H PRN Rankin, Shuvon B, NP   650 mg at 01/19/22 2117   albuterol (VENTOLIN HFA) 108 (90 Base) MCG/ACT inhaler 2 puff  2 puff Inhalation Q6H PRN Rankin, Shuvon B, NP   2 puff at 12/09/21 2102   alum & mag hydroxide-simeth (MAALOX/MYLANTA) 200-200-20 MG/5ML suspension 30 mL  30 mL Oral Q4H PRN Rankin, Shuvon B, NP   30 mL at 12/19/21 2109   apixaban (ELIQUIS) tablet 5 mg  5 mg Oral BID Rankin, Shuvon B, NP   5 mg at 01/22/22 0810   cloZAPine (CLOZARIL) tablet 50 mg  50 mg Oral Daily [name], NP   50 mg at 01/22/22 0943   cloZAPine (CLOZARIL) tablet 75 mg  75 mg Oral QHS [name], NP   75 mg at 01/21/22 2150   diltiazem (CARDIZEM CD) 24 hr capsule 240 mg  240 mg Oral Daily Rankin, Shuvon B, NP   240 mg at 01/22/22 0810   divalproex (DEPAKOTE ER) 24 hr tablet 500 mg  500 mg Oral BID Rankin, Shuvon B, NP   500 mg at 01/22/22 0810   fluticasone furoate-vilanterol (BREO ELLIPTA) 200-25 MCG/ACT 1 puff  1 puff Inhalation Daily Rankin, Shuvon B, NP   1 puff at 01/22/22 0813   haloperidol (HALDOL) tablet 10 mg  10 mg Oral Q8H PRN Rankin, Shuvon B, NP       insulin aspart (novoLOG) injection 0-9 Units  0-9 Units Subcutaneous TID WC Tharon Aquas, NP   1 Units at 01/21/22 1625   insulin glargine-yfgn (SEMGLEE) injection 8 Units  8 Units Subcutaneous Daily Tharon Aquas, NP   8 Units at 01/22/22 0810   latanoprost (XALATAN) 0.005 % ophthalmic solution 1 drop  1 drop Both Eyes QHS Rankin, Shuvon B, NP   1 drop at 01/21/22 2154   magnesium hydroxide (MILK OF MAGNESIA) suspension 30 mL  30 mL Oral Daily PRN Rankin, Shuvon B, NP       menthol-cetylpyridinium (CEPACOL) lozenge 3 mg  1 lozenge Oral PRN [name], NP    3 mg at 01/22/22 0020   metFORMIN (GLUCOPHAGE-XR) 24 hr tablet 1,000 mg  1,000 mg Oral BID WC [name], DO   1,000 mg at 01/22/22 0810   multivitamins with iron tablet 1 tablet  1 tablet Oral Daily [name], DO   1 tablet at 01/22/22 0810   nicotine (NICODERM CQ - dosed in mg/24 hr) patch 7 mg  7 mg Transdermal Daily Rankin, Shuvon B, NP   7 mg at 01/22/22 0811   polyethylene glycol (MIRALAX / GLYCOLAX) packet 17 g  17 g Oral Daily  [name], DO   17 g at 01/22/22 0810   Vitamin D (Ergocalciferol) (DRISDOL) 1.25 MG (50000 UNIT) capsule 50,000 Units  50,000 Units Oral Q7 days Rankin, Shuvon B, NP   50,000 Units at 01/21/22 1013   Current Outpatient Medications  Medication Sig Dispense Refill   Accu-Chek Softclix Lancets lancets Use as directed up to four times daily 100 each 0   apixaban (ELIQUIS) 5 MG TABS tablet Take 1 tablet (5 mg total) by mouth 2 (two) times daily. 60 tablet 0   ARIPiprazole (ABILIFY) 10 MG tablet Take 1 tablet (10 mg total) by mouth daily. 30 tablet 0   Blood Glucose Monitoring Suppl (ACCU-CHEK GUIDE) w/Device KIT Use as directed up to four times daily 1 kit 0   budesonide-formoterol (SYMBICORT) 160-4.5 MCG/ACT inhaler Inhale 2 puffs into the lungs in the morning and at bedtime.     Cholecalciferol (VITAMIN D3) 1.25 MG (50000 UT) CAPS Take 50,000 Units by mouth every Thursday.     cloZAPine (CLOZARIL) 25 MG tablet Take 3 tablets (75 mg total) by mouth at bedtime. 90 tablet 0   clozapine (CLOZARIL) 50 MG tablet Take 1 tablet (50 mg total) by mouth daily. 30 tablet 0   diltiazem (CARDIZEM CD) 240 MG 24 hr capsule Take 1 capsule (240 mg total) by mouth daily. (Patient not taking: Reported on 11/24/2021) 30 capsule 0   divalproex (DEPAKOTE ER) 500 MG 24 hr tablet Take 1 tablet (500 mg total) by mouth 2 (two) times daily. 60 tablet 0   glucose blood test strip Use as directed up to four times daily 50 each 0   haloperidol (HALDOL) 10 MG tablet Take 1 tablet (10 mg  total) by mouth 3 (three) times daily as needed for agitation (and psychotic symptoms).     INGREZZA 40 MG capsule Take 1 capsule (40 mg total) by mouth in the morning. 30 capsule 0   insulin aspart (NOVOLOG) 100 UNIT/ML FlexPen Before each meal 3 times a day, 140-199 - 2 units, 200-250 - 4 units, 251-299 - 6 units,  300-349 - 8 units,  350 or above 10 units. Insulin PEN if approved, provide syringes and needles if needed.Please switch to any approved short acting Insulin if needed. 15 mL 0   insulin glargine (LANTUS) 100 UNIT/ML Solostar Pen Inject 12 Units into the skin daily. 15 mL 0   Insulin Pen Needle 32G X 4 MM MISC Use 4 times a day with insulin, 1 month supply. 100 each 0   latanoprost (XALATAN) 0.005 % ophthalmic solution Place 1 drop into both eyes at bedtime.     metFORMIN (GLUCOPHAGE) 500 MG tablet Take 500 mg by mouth 2 (two) times daily with a meal.     nicotine (NICODERM CQ - DOSED IN MG/24 HR) 7 mg/24hr patch Place 1 patch (7 mg total) onto the skin daily. 28 patch 0   PROAIR HFA 108 (90 Base) MCG/ACT inhaler Inhale 2 puffs into the lungs every 6 (six) hours as needed for wheezing or shortness of breath.      Labs  Lab Results:  No results displayed because visit has over 200 results.    Admission on 11/22/2021, Discharged on 11/22/2021  Component Date Value Ref Range Status   Glucose-Capillary 11/22/2021 169 (H)  70 - 99 mg/dL Final   Glucose reference range applies only to samples taken after fasting for at least 8 hours.  No results displayed because visit has over 200 results.    Admission  on 10/04/2021, Discharged on 10/22/2021  Component Date Value Ref Range Status   SARS Coronavirus 2 by RT PCR 10/04/2021 NEGATIVE  NEGATIVE Final   Comment: (NOTE) SARS-CoV-2 target nucleic acids are NOT DETECTED.  The SARS-CoV-2 RNA is generally detectable in upper respiratory specimens during the acute phase of infection. The lowest concentration of SARS-CoV-2 viral copies this  assay can detect is 138 copies/mL. A negative result does not preclude SARS-Cov-2 infection and should not be used as the sole basis for treatment or other patient management decisions. A negative result may occur with  improper specimen collection/handling, submission of specimen other than nasopharyngeal swab, presence of viral mutation(s) within the areas targeted by this assay, and inadequate number of viral copies(<138 copies/mL). A negative result must be combined with clinical observations, patient history, and epidemiological information. The expected result is Negative.  Fact Sheet for Patients:  EntrepreneurPulse.com.au  Fact Sheet for Healthcare Providers:  IncredibleEmployment.be  This test is no                          t yet approved or cleared by the [place] FDA and  has been authorized for detection and/or diagnosis of SARS-CoV-2 by FDA under an Emergency Use Authorization (EUA). This EUA will remain  in effect (meaning this test can be used) for the duration of the COVID-19 declaration under Section 564(b)(1) of the Act, 21 U.S.C.section 360bbb-3(b)(1), unless the authorization is terminated  or revoked sooner.       Influenza A by PCR 10/04/2021 NEGATIVE  NEGATIVE Final   Influenza B by PCR 10/04/2021 NEGATIVE  NEGATIVE Final   Comment: (NOTE) The Xpert Xpress SARS-CoV-2/FLU/RSV plus assay is intended as an aid in the diagnosis of influenza from Nasopharyngeal swab specimens and should not be used as a sole basis for treatment. Nasal washings and aspirates are unacceptable for Xpert Xpress SARS-CoV-2/FLU/RSV testing.  Fact Sheet for Patients: EntrepreneurPulse.com.au  Fact Sheet for Healthcare Providers: IncredibleEmployment.be  This test is not yet approved or cleared by the [place] FDA and has been authorized for detection and/or diagnosis of SARS-CoV-2 by FDA under an  Emergency Use Authorization (EUA). This EUA will remain in effect (meaning this test can be used) for the duration of the COVID-19 declaration under Section 564(b)(1) of the Act, 21 U.S.C. section 360bbb-3(b)(1), unless the authorization is terminated or revoked.  Performed at [facility], [place] [address] [place], [place] [address]    WBC 10/04/2021 8.4  4.0 - 10.5 K/uL Final   RBC 10/04/2021 4.42  3.87 - 5.11 MIL/uL Final   Hemoglobin 10/04/2021 11.6 (L)  12.0 - 15.0 g/dL Final   HCT 10/04/2021 35.7 (L)  36.0 - 46.0 % Final   MCV 10/04/2021 80.8  80.0 - 100.0 fL Final   MCH 10/04/2021 26.2  26.0 - 34.0 pg Final   MCHC 10/04/2021 32.5  30.0 - 36.0 g/dL Final   RDW 10/04/2021 16.0 (H)  11.5 - 15.5 % Final   Platelets 10/04/2021 372  150 - 400 K/uL Final   nRBC 10/04/2021 0.0  0.0 - 0.2 % Final   Neutrophils Relative % 10/04/2021 68  % Final   Neutro Abs 10/04/2021 5.7  1.7 - 7.7 K/uL Final   Lymphocytes Relative 10/04/2021 22  % Final   Lymphs Abs 10/04/2021 1.8  0.7 - 4.0 K/uL Final   Monocytes Relative 10/04/2021 8  % Final   Monocytes Absolute 10/04/2021 0.7  0.1 -  1.0 K/uL Final   Eosinophils Relative 10/04/2021 1  % Final   Eosinophils Absolute 10/04/2021 0.1  0.0 - 0.5 K/uL Final   Basophils Relative 10/04/2021 1  % Final   Basophils Absolute 10/04/2021 0.1  0.0 - 0.1 K/uL Final   Immature Granulocytes 10/04/2021 0  % Final   Abs Immature Granulocytes 10/04/2021 0.03  0.00 - 0.07 K/uL Final   Performed at [facility], [place] [address], [place], [place] [address]   Sodium 10/04/2021 136  135 - 145 mmol/L Final   Potassium 10/04/2021 4.2  3.5 - 5.1 mmol/L Final   Chloride 10/04/2021 104  98 - 111 mmol/L Final   CO2 10/04/2021 25  22 - 32 mmol/L Final   Glucose, Bld 10/04/2021 100 (H)  70 - 99 mg/dL Final   Glucose reference range applies only to samples taken after fasting for at least 8 hours.   BUN 10/04/2021 9  6 - 20 mg/dL Final   Creatinine, Ser 10/04/2021  0.52  0.44 - 1.00 mg/dL Final   Calcium 10/04/2021 9.0  8.9 - 10.3 mg/dL Final   Total Protein 10/04/2021 7.0  6.5 - 8.1 g/dL Final   Albumin 10/04/2021 3.2 (L)  3.5 - 5.0 g/dL Final   AST 10/04/2021 13 (L)  15 - 41 U/L Final   ALT 10/04/2021 10  0 - 44 U/L Final   Alkaline Phosphatase 10/04/2021 61  38 - 126 U/L Final   Total Bilirubin 10/04/2021 0.3  0.3 - 1.2 mg/dL Final   GFR, Estimated 10/04/2021 >60  >60 mL/min Final   Comment: (NOTE) Calculated using the CKD-EPI Creatinine Equation (2021)    Anion gap 10/04/2021 7  5 - 15 Final   Performed at [facility] [address] [place], [place] [address]   Hgb A1c MFr Bld 10/04/2021 7.0 (H)  4.8 - 5.6 % Final   Comment: (NOTE) Pre diabetes:          5.7%-6.4%  Diabetes:              >6.4%  Glycemic control for   <7.0% adults with diabetes    Mean Plasma Glucose 10/04/2021 154.2  mg/dL Final   Performed at [facility], [place] [address], [place], [place] [address]   Cholesterol 10/04/2021 178  0 - 200 mg/dL Final   Triglycerides 10/04/2021 155 (H)  <150 mg/dL Final   HDL 10/04/2021 52  >40 mg/dL Final   Total CHOL/HDL Ratio 10/04/2021 3.4  RATIO Final   VLDL 10/04/2021 31  0 - 40 mg/dL Final   LDL Cholesterol 10/04/2021 95  0 - 99 mg/dL Final   Comment:        Total Cholesterol/HDL:CHD Risk Coronary Heart Disease Risk Table                     Men   Women  1/2 Average Risk   3.4   3.3  Average Risk       5.0   4.4  2 X Average Risk   9.6   7.1  3 X Average Risk  23.4   11.0        Use the calculated Patient Ratio above and the CHD Risk Table to determine the patient's CHD Risk.        ATP III CLASSIFICATION (LDL):  <100     mg/dL   Optimal  100-129  mg/dL   Near or Above  Optimal  130-159  mg/dL   Borderline  160-189  mg/dL   High  >190     mg/dL   Very High Performed at [facility] [address] [place], [place] [address]    POC Amphetamine UR 10/04/2021 None Detected  NONE  DETECTED (Cut Off Level 1000 ng/mL) Final   POC Secobarbital (BAR) 10/04/2021 None Detected  NONE DETECTED (Cut Off Level 300 ng/mL) Final   POC Buprenorphine (BUP) 10/04/2021 None Detected  NONE DETECTED (Cut Off Level 10 ng/mL) Final   POC Oxazepam (BZO) 10/04/2021 None Detected  NONE DETECTED (Cut Off Level 300 ng/mL) Final   POC Cocaine UR 10/04/2021 None Detected  NONE DETECTED (Cut Off Level 300 ng/mL) Final   POC Methamphetamine UR 10/04/2021 None Detected  NONE DETECTED (Cut Off Level 1000 ng/mL) Final   POC Morphine 10/04/2021 None Detected  NONE DETECTED (Cut Off Level 300 ng/mL) Final   POC Methadone UR 10/04/2021 None Detected  NONE DETECTED (Cut Off Level 300 ng/mL) Final   POC Oxycodone UR 10/04/2021 Positive (A)  NONE DETECTED (Cut Off Level 100 ng/mL) Final   POC Marijuana UR 10/04/2021 None Detected  NONE DETECTED (Cut Off Level 50 ng/mL) Final   SARSCOV2ONAVIRUS 2 AG 10/04/2021 NEGATIVE  NEGATIVE Final   Comment: (NOTE) SARS-CoV-2 antigen NOT DETECTED.   Negative results are presumptive.  Negative results do not preclude SARS-CoV-2 infection and should not be used as the sole basis for treatment or other patient management decisions, including infection  control decisions, particularly in the presence of clinical signs and  symptoms consistent with COVID-19, or in those who have been in contact with the virus.  Negative results must be combined with clinical observations, patient history, and epidemiological information. The expected result is Negative.  Fact Sheet for Patients: HandmadeRecipes.com.cy  Fact Sheet for Healthcare Providers: FuneralLife.at  This test is not yet approved or cleared by the [place] FDA and  has been authorized for detection and/or diagnosis of SARS-CoV-2 by FDA under an Emergency Use Authorization (EUA).  This EUA will remain in effect (meaning this test can be used) for the duration of   the COV                          ID-19 declaration under Section 564(b)(1) of the Act, 21 U.S.C. section 360bbb-3(b)(1), unless the authorization is terminated or revoked sooner.     Valproic Acid Lvl 10/04/2021 51  50.0 - 100.0 ug/mL Final   Performed at [facility] [address], [place], [place] [address]   Valproic Acid Lvl 10/08/2021 57  50.0 - 100.0 ug/mL Final   Performed at [facility] [address] [place], [place] [address]   Glucose-Capillary 10/09/2021 123 (H)  70 - 99 mg/dL Final   Glucose reference range applies only to samples taken after fasting for at least 8 hours.  Admission on 09/09/2021, Discharged on 09/10/2021  Component Date Value Ref Range Status   Sodium 09/09/2021 134 (L)  135 - 145 mmol/L Final   Potassium 09/09/2021 4.3  3.5 - 5.1 mmol/L Final   Chloride 09/09/2021 99  98 - 111 mmol/L Final   CO2 09/09/2021 25  22 - 32 mmol/L Final   Glucose, Bld 09/09/2021 107 (H)  70 - 99 mg/dL Final   Glucose reference range applies only to samples taken after fasting for at least 8 hours.   BUN 09/09/2021 12  6 - 20 mg/dL Final   Creatinine, Ser 09/09/2021 0.74  0.44 - 1.00 mg/dL Final   Calcium 09/09/2021 9.0  8.9 - 10.3 mg/dL Final   Total Protein 09/09/2021 7.4  6.5 - 8.1 g/dL Final   Albumin 09/09/2021 3.1 (L)  3.5 - 5.0 g/dL Final   AST 09/09/2021 13 (L)  15 - 41 U/L Final   ALT 09/09/2021 13  0 - 44 U/L Final   Alkaline Phosphatase 09/09/2021 66  38 - 126 U/L Final   Total Bilirubin 09/09/2021 0.2 (L)  0.3 - 1.2 mg/dL Final   GFR, Estimated 09/09/2021 >60  >60 mL/min Final   Comment: (NOTE) Calculated using the CKD-EPI Creatinine Equation (2021)    Anion gap 09/09/2021 10  5 - 15 Final   Performed at [facility], [place] [address], [place], [place] [address]   WBC 09/09/2021 8.5  4.0 - 10.5 K/uL Final   RBC 09/09/2021 4.53  3.87 - 5.11 MIL/uL Final   Hemoglobin 09/09/2021 11.8 (L)  12.0 - 15.0 g/dL Final   HCT 09/09/2021 38.0  36.0  - 46.0 % Final   MCV 09/09/2021 83.9  80.0 - 100.0 fL Final   MCH 09/09/2021 26.0  26.0 - 34.0 pg Final   MCHC 09/09/2021 31.1  30.0 - 36.0 g/dL Final   RDW 09/09/2021 17.8 (H)  11.5 - 15.5 % Final   Platelets 09/09/2021 442 (H)  150 - 400 K/uL Final   nRBC 09/09/2021 0.0  0.0 - 0.2 % Final   Neutrophils Relative % 09/09/2021 70  % Final   Neutro Abs 09/09/2021 5.9  1.7 - 7.7 K/uL Final   Lymphocytes Relative 09/09/2021 20  % Final   Lymphs Abs 09/09/2021 1.7  0.7 - 4.0 K/uL Final   Monocytes Relative 09/09/2021 8  % Final   Monocytes Absolute 09/09/2021 0.7  0.1 - 1.0 K/uL Final   Eosinophils Relative 09/09/2021 1  % Final   Eosinophils Absolute 09/09/2021 0.1  0.0 - 0.5 K/uL Final   Basophils Relative 09/09/2021 1  % Final   Basophils Absolute 09/09/2021 0.1  0.0 - 0.1 K/uL Final   Immature Granulocytes 09/09/2021 0  % Final   Abs Immature Granulocytes 09/09/2021 0.03  0.00 - 0.07 K/uL Final   Performed at [facility], [place] [address] [place], [place] [address]   Ammonia 09/09/2021 37 (H)  9 - 35 umol/L Final   Comment: HEMOLYSIS AT THIS LEVEL MAY AFFECT RESULT Performed at [facility], [place] [address], [place],  [address]    Opiates 09/09/2021 NONE DETECTED  NONE DETECTED Final   Cocaine 09/09/2021 NONE DETECTED  NONE DETECTED Final   Benzodiazepines 09/09/2021 NONE DETECTED  NONE DETECTED Final   Amphetamines 09/09/2021 NONE DETECTED  NONE DETECTED Final   Tetrahydrocannabinol 09/09/2021 NONE DETECTED  NONE DETECTED Final   Barbiturates 09/09/2021 NONE DETECTED  NONE DETECTED Final   Comment: (NOTE) DRUG SCREEN FOR MEDICAL PURPOSES ONLY.  IF CONFIRMATION IS NEEDED FOR ANY PURPOSE, NOTIFY LAB WITHIN 5 DAYS.  LOWEST DETECTABLE LIMITS FOR URINE DRUG SCREEN Drug Class                     Cutoff (ng/mL) Amphetamine and metabolites    1000 Barbiturate and metabolites    200 Benzodiazepine                 749 Tricyclics and metabolites     300 Opiates and  metabolites  300 Cocaine and metabolites        300 THC                            50 Performed at [facility], [place] [address] [place], [place] [address]    Alcohol, Ethyl (B) 09/09/2021 <10  <10 mg/dL Final   Comment: (NOTE) Lowest detectable limit for serum alcohol is 10 mg/dL.  For medical purposes only. Performed at [facility], [place] [address], [place], [place] [address]    Lipase 09/09/2021 33  11 - 51 U/L Final   Performed at [facility] [address], [place], [place] [address]   Color, Urine 09/09/2021 COLORLESS (A)  YELLOW Final   APPearance 09/09/2021 CLEAR  CLEAR Final   Specific Gravity, Urine 09/09/2021 1.002 (L)  1.005 - 1.030 Final   pH 09/09/2021 6.0  5.0 - 8.0 Final   Glucose, UA 09/09/2021 NEGATIVE  NEGATIVE mg/dL Final   Hgb urine dipstick 09/09/2021 NEGATIVE  NEGATIVE Final   Bilirubin Urine 09/09/2021 NEGATIVE  NEGATIVE Final   Ketones, ur 09/09/2021 NEGATIVE  NEGATIVE mg/dL Final   Protein, ur 09/09/2021 NEGATIVE  NEGATIVE mg/dL Final   Nitrite 09/09/2021 NEGATIVE  NEGATIVE Final   Leukocytes,Ua 09/09/2021 NEGATIVE  NEGATIVE Final   Performed at [facility] [address], [place], [place] [address]   Specimen Description 09/09/2021 URINE, CLEAN CATCH   Final   Special Requests 09/09/2021 NONE   Final   Culture 09/09/2021  (A)   Final                   Value:<10,000 COLONIES/mL INSIGNIFICANT GROWTH Performed at [facility], [place] [address] [place], [place] [address]    Report Status 09/09/2021 09/10/2021 FINAL   Final   D-Dimer, Quant 09/09/2021 0.92 (H)  0.00 - 0.50 ug/mL-FEU Final   Comment: (NOTE) At the manufacturer cut-off value of 0.5 g/mL FEU, this assay has a negative predictive value of 95-100%.This assay is intended for use in conjunction with a clinical pretest probability (PTP) assessment model to exclude pulmonary embolism (PE) and deep venous thrombosis (DVT) in outpatients suspected of PE or  DVT. Results should be correlated with clinical presentation. Performed at [facility], [place] [address] [place], [place] [address]    Troponin I (High Sensitivity) 09/09/2021 5  <18 ng/L Final   Comment: (NOTE) Elevated high sensitivity troponin I (hsTnI) values and significant  changes across serial measurements may suggest ACS but many other  chronic and acute conditions are known to elevate hsTnI results.  Refer to the "Links" section for chest pain algorithms and additional  guidance. Performed at [facility], [place] [address] [place], [place] [address]    BP 09/10/2021 135/83  mmHg Final   S' Lateral 09/10/2021 2.30  cm Final   Area-P 1/2 09/10/2021 5.58  cm2 Final    Blood Alcohol level:  Lab Results  Component Value Date   ETH <10 11/23/2021   ETH <10 28/00/3491    Metabolic Disorder Labs: Lab Results  Component Value Date   HGBA1C 6.7 (H) 12/28/2021   MPG 145.59 12/28/2021   MPG 154.2 10/04/2021   Lab Results  Component Value Date   PROLACTIN 3.8 (L) 11/23/2021   Lab Results  Component Value Date   CHOL 171 11/23/2021   TRIG 125 11/23/2021   HDL 65 11/23/2021  CHOLHDL 2.6 11/23/2021   VLDL 25 11/23/2021   LDLCALC 81 11/23/2021   LDLCALC 95 10/04/2021    Therapeutic Lab Levels: No results found for: "LITHIUM" Lab Results  Component Value Date   VALPROATE 33 (L) 01/18/2022   VALPROATE 33 (L) 12/28/2021   No results found for: "CBMZ"  Physical Findings   PHQ2-9    [facility] from 11/23/2021 in [facility]  PHQ-2 Total Score 2  PHQ-9 Total Score 3      Flowsheet Row ED from 11/23/2021 in [facility] Most recent reading at 11/23/2021  6:44 PM ED from 11/22/2021 in [facility] Most recent reading at 11/22/2021  8:28 PM ED from 11/22/2021 in [facility] Most recent reading at 11/22/2021  1:24 AM  C-SSRS RISK  CATEGORY No Risk No Risk No Risk        Musculoskeletal  Strength & Muscle Tone: within normal limits Gait & Station: normal Patient leans: N/A  Psychiatric Specialty Exam  Presentation  General Appearance:  Appropriate for Environment; Casual  Eye Contact: Good  Speech: Clear and Coherent; Normal Rate  Speech Volume: Normal  Handedness: Right   Mood and Affect  Mood: Euthymic  Affect: Flat   Thought Process  Thought Processes: Coherent  Descriptions of Associations:Intact  Orientation:Full (Time, Place and Person)  Thought Content:WDL  Diagnosis of Schizophrenia or Schizoaffective disorder in past: Yes  Duration of Psychotic Symptoms: No data recorded  Hallucinations:Hallucinations: None  Ideas of Reference:None  Suicidal Thoughts:Suicidal Thoughts: No  Homicidal Thoughts:Homicidal Thoughts: No   Sensorium  Memory: Immediate Good  Judgment: Intact  Insight: Present   Executive Functions  Concentration: Fair  Attention Span: Fair  Recall: [employer] of Knowledge: Fair  Language: Fair   Psychomotor Activity  Psychomotor Activity: Psychomotor Activity: Normal   Assets  Assets: Communication Skills; Desire for Improvement; Resilience   Sleep  Sleep: Sleep: Fair   No data recorded  Physical Exam  Physical Exam Constitutional:      General: She is not in acute distress.    Appearance: She is not ill-appearing, toxic-appearing or diaphoretic.  Eyes:     General: No scleral icterus. Cardiovascular:     Rate and Rhythm: Tachycardia present.  Pulmonary:     Effort: Pulmonary effort is normal. No respiratory distress.  Neurological:     Mental Status: She is alert and oriented to person, place, and time.  Psychiatric:        Attention and Perception: Attention and perception normal.        Mood and Affect: Mood normal. Affect is flat.        Speech: Speech normal.        Behavior: Behavior normal. Behavior is  cooperative.        Thought Content: Thought content normal.    Review of Systems  Constitutional:  Negative for chills and fever.  Respiratory:  Negative for shortness of breath.   Cardiovascular:  Negative for chest pain and palpitations.  Gastrointestinal:  Negative for abdominal pain.  Neurological:  Negative for headaches.   Blood pressure 113/73, pulse (!) 112, temperature 98.2 F (36.8 C), temperature source Oral, resp. rate 16, SpO2 96 %. There is no height or weight on file to calculate BMI.  Treatment Plan Summary: Plan Pt remains psychiatrically cleared. Disposition is pending placement.  Tharon Aquas, NP 01/22/2022 10:21 AM

## 2022-01-22 NOTE — ED Notes (Signed)
Pt presents with depressed mood, affect congruent. Teresa Murillo states she is feeling fine, reports sleeping okay and denies any acute concerns. She continues to focus mainly on food and requests multiple food items but is having regular Bm and reports on BM today. She is aware that staff need to collect blood and requests to eat prior to draw. Pt denies any SI HI or AV Hallucinations. Able to make needs known.

## 2022-01-22 NOTE — Progress Notes (Signed)
[facility] called the following places for possible placement:   The client's care manager is looking for new placements due to [facility] owner (rose Corrie Dandy [phone] denied placement due to  a lack of resources.     [facility] called the following places in addition to her care manager also looking for placement:   [facility]: [phone].  No one answered.  [facility] was unable to leave a message.   [facility]: [phone].  [facility] spoke with staff and they report to call on Monday to the main number at [phone].  They close at 5pm  [facility]:[phone].  They report they are not accepting any new clients.  [facility]:[phone].  No one answered.  [facility] was unable to leave a VM.   Rhea Bleacher  [facility]

## 2022-01-22 NOTE — ED Notes (Signed)
Pt A&O x 4, no distress noted. Calm & cooperative, Watching TV at present.  Monitoring for safety.

## 2022-01-22 NOTE — ED Notes (Signed)
Patient resting in bed in no sxs of distress - will continue to monitor for safety

## 2022-01-22 NOTE — Progress Notes (Signed)
[facility] also tried to locate the client's relative by doing another Microbiologist.  Client is unsure of her relatives phone number.

## 2022-01-22 NOTE — ED Notes (Signed)
Pt awake & resting at present, complaint of difficulty sleeping.  NP Ene Ajibola notified.  Benadryl given.  Monitoring for safety.

## 2022-01-23 ENCOUNTER — Observation Stay (HOSPITAL_COMMUNITY)
Admission: EM | Admit: 2022-01-23 | Discharge: 2022-01-24 | Disposition: A | Discharge status: Home / Self Care | Payer: Medicaid Other | Attending: Internal Medicine | Admitting: Internal Medicine

## 2022-01-23 ENCOUNTER — Emergency Department (HOSPITAL_COMMUNITY): Payer: Medicaid Other

## 2022-01-23 DIAGNOSIS — F17290 Nicotine dependence, other tobacco product, uncomplicated: Secondary | ICD-10-CM | POA: Diagnosis not present

## 2022-01-23 DIAGNOSIS — J449 Chronic obstructive pulmonary disease, unspecified: Secondary | ICD-10-CM | POA: Insufficient documentation

## 2022-01-23 DIAGNOSIS — R0789 Other chest pain: Secondary | ICD-10-CM | POA: Diagnosis present

## 2022-01-23 DIAGNOSIS — R000 Tachycardia, unspecified: Secondary | ICD-10-CM | POA: Insufficient documentation

## 2022-01-23 DIAGNOSIS — Z7984 Long term (current) use of oral hypoglycemic drugs: Secondary | ICD-10-CM | POA: Insufficient documentation

## 2022-01-23 DIAGNOSIS — I10 Essential (primary) hypertension: Secondary | ICD-10-CM | POA: Insufficient documentation

## 2022-01-23 DIAGNOSIS — R0602 Shortness of breath: Secondary | ICD-10-CM | POA: Insufficient documentation

## 2022-01-23 DIAGNOSIS — Z79899 Other long term (current) drug therapy: Secondary | ICD-10-CM | POA: Diagnosis not present

## 2022-01-23 DIAGNOSIS — Z7901 Long term (current) use of anticoagulants: Secondary | ICD-10-CM | POA: Diagnosis not present

## 2022-01-23 DIAGNOSIS — Z794 Long term (current) use of insulin: Secondary | ICD-10-CM | POA: Insufficient documentation

## 2022-01-23 DIAGNOSIS — E119 Type 2 diabetes mellitus without complications: Secondary | ICD-10-CM | POA: Insufficient documentation

## 2022-01-23 DIAGNOSIS — F17210 Nicotine dependence, cigarettes, uncomplicated: Secondary | ICD-10-CM | POA: Insufficient documentation

## 2022-01-23 DIAGNOSIS — Z1152 Encounter for screening for COVID-19: Secondary | ICD-10-CM | POA: Diagnosis not present

## 2022-01-23 DIAGNOSIS — F209 Schizophrenia, unspecified: Secondary | ICD-10-CM | POA: Diagnosis not present

## 2022-01-23 DIAGNOSIS — I3139 Other pericardial effusion (noninflammatory): Secondary | ICD-10-CM

## 2022-01-23 DIAGNOSIS — R079 Chest pain, unspecified: Secondary | ICD-10-CM | POA: Diagnosis present

## 2022-01-23 DIAGNOSIS — J810 Acute pulmonary edema: Secondary | ICD-10-CM

## 2022-01-23 LAB — BASIC METABOLIC PANEL
Anion gap: 14 (ref 5–15)
BUN: 13 mg/dL (ref 6–20)
CO2: 25 mmol/L (ref 22–32)
Calcium: 9.3 mg/dL (ref 8.9–10.3)
Chloride: 101 mmol/L (ref 98–111)
Creatinine, Ser: 0.62 mg/dL (ref 0.44–1.00)
GFR, Estimated: >60 mL/min (ref >60)
Glucose, Bld: 198 mg/dL — ABNORMAL HIGH (ref 70–99)
Potassium: 4.4 mmol/L (ref 3.5–5.1)
Sodium: 140 mmol/L (ref 135–145)

## 2022-01-23 LAB — CBC
HCT: 37.4 % (ref 36.0–46.0)
Hemoglobin: 11.7 g/dL — ABNORMAL LOW (ref 12.0–15.0)
MCH: 25.3 pg — ABNORMAL LOW (ref 26.0–34.0)
MCHC: 31.3 g/dL (ref 30.0–36.0)
MCV: 80.8 fL (ref 80.0–100.0)
Platelets: 333 10*3/uL (ref 150–400)
RBC: 4.63 MIL/uL (ref 3.87–5.11)
RDW: 17.9 % — ABNORMAL HIGH (ref 11.5–15.5)
WBC: 5.8 10*3/uL (ref 4.0–10.5)
nRBC: 0 % (ref 0.0–0.2)

## 2022-01-23 LAB — URINALYSIS, ROUTINE W REFLEX MICROSCOPIC
Bacteria, UA: NONE SEEN
Bilirubin Urine: NEGATIVE
Glucose, UA: NEGATIVE mg/dL
Hgb urine dipstick: NEGATIVE
Ketones, ur: NEGATIVE mg/dL
Nitrite: NEGATIVE
Protein, ur: NEGATIVE mg/dL
Specific Gravity, Urine: 1.018 (ref 1.005–1.030)
pH: 7 (ref 5.0–8.0)

## 2022-01-23 LAB — GLUCOSE, CAPILLARY
Glucose-Capillary: 122 mg/dL — ABNORMAL HIGH (ref 70–99)
Glucose-Capillary: 157 mg/dL — ABNORMAL HIGH (ref 70–99)

## 2022-01-23 LAB — BRAIN NATRIURETIC PEPTIDE: B Natriuretic Peptide: 41.3 pg/mL (ref 0.0–100.0)

## 2022-01-23 LAB — TROPONIN I (HIGH SENSITIVITY)
Troponin I (High Sensitivity): 6 ng/L (ref <18)
Troponin I (High Sensitivity): 5 ng/L (ref <18)

## 2022-01-23 LAB — GROUP A STREP BY PCR: Group A Strep by PCR: NOT DETECTED

## 2022-01-23 LAB — SARS CORONAVIRUS 2 BY RT PCR: SARS Coronavirus 2 by RT PCR: NEGATIVE

## 2022-01-23 NOTE — ED Notes (Signed)
Patient was transported via EMS to [facility], Dr. Fredderick Phenix was the receiving physician. Patient reported she was not feeling well and refused prn's. Provider assessed patient and requested patient be sent to [facility] for medical clearance.

## 2022-01-23 NOTE — ED Provider Notes (Signed)
Behavioral Health Progress Note  Date and Time: 01/23/2022 1:59 PM Name: Teresa Murillo MRN:  [id number]  Per H&P, Teresa Murillo is a 61 y.o. female with a past psychiatric history of schizophrenia, aggressive behavior, and borderline intellectual disability presenting to [facility] on 11/23/21 voluntarily as a walk in via [facility] with complaints that she was locked out of the group home. Patient has been dismissed from group home due to multiple elopements. Pt had already been discharged from this facility but had been living there temporarily while DSS was looking for new placement. Pt is boarding at [facility].   Subjective: 0830 am -  Patient seen and evaluated face to face by this provider, chart reviewed and case discussed with Dr. Lovette Cliche. Patient denies SI/HI/AVH. There is no objective evidence that the patient is responding to internal or external stimuli. She reports feeling depressed today because she can't get out of here. He reports fair sleep. She reports a fair appetite. She is medication compliant and denies medication side effects. No chest pain, N/V, SOB, muscle spasms or involuntary movement.   This afternoon patient complained of generalized body aches.   Patient reevaluated this afternoon for c/o of body aches. On exam patient c/o generalized body aches, generalized weakness, medial chest pain, dizziness, sore throat and runny nose. She reports intermittent body aches for a week. She reports that her throat started hurting last night. She denies difficulty swallowing. She denies SOB. She denies coughs. She reports intermittent chest pain today and rates her pain 8/10 with 10 being worst. States that she has A-fib. She is able to move upper and lower extremities on exam but states that she does not feel good and does not feel like standing up. Patient transferred to [facility] for medical evaluation. Report called to Dr. Tamera Punt at [facility].    Vital signs  HR 132 O2 97% RA  B/P  120/70 Temp 97.6   Dr. Lovette Cliche recommends obtaining a CBC w/diff while in the ED  Diagnosis:  Final diagnoses:  At risk for self care deficit  Noncompliance  Self-care deficit for medication administration  Schizophrenia, unspecified type ([place])    Total Time spent with patient: 20 minutes  Past Psychiatric History: a past psychiatric history of schizophrenia, aggressive behavior, and borderline intellectual disability.   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 On the Wechsler Adult Intelligence Scale-4, Teresa Murillo achieved a full-scale IQ score of 73 and a percentile rank of 4 placing her in the borderline range of intellectual functioning.    Chronic obstructive pulmonary disease (COPD) (HCC)    Glaucoma    Hyperlipidemia    Hypertension    Iron deficiency    Schizoaffective disorder (HCC)    Type 2 diabetes mellitus (HCC)     Past Surgical History:  Procedure Laterality Date   TUBAL LIGATION     Family History:  Family History  Problem Relation Age of Onset   Breast cancer Maternal Grandmother    Family Psychiatric  History:  Social History:  Social History   Substance and Sexual Activity  Alcohol Use Yes     Social History   Substance and Sexual Activity  Drug Use Not Currently    Social History   Socioeconomic History   Marital status: Divorced    Spouse name: Not on file   Number of children: Not on file   Years of education: Not  on file   Highest education level: Not on file  Occupational History   Not on file  Tobacco Use   Smoking status: Every Day    Packs/day: 1.00    Types: Cigars, Cigarettes   Smokeless tobacco: Current  Vaping Use   Vaping Use: Never used  Substance and Sexual Activity   Alcohol use: Yes   Drug use: Not Currently   Sexual activity: Not Currently    Birth control/protection:  Surgical  Other Topics Concern   Not on file  Social History Narrative   Not on file   Social Determinants of Health   Financial Resource Strain: Not on file  Food Insecurity: Not on file  Transportation Needs: Not on file  Physical Activity: Not on file  Stress: Not on file  Social Connections: Not on file   SDOH:  SDOH Screenings   Depression (PHQ2-9): Low Risk  (11/23/2021)  Tobacco Use: High Risk (12/30/2021)   Additional Social History:    Pain Medications: See MAR Prescriptions: See MAR Over the Counter: See MAR History of alcohol / drug use?: No history of alcohol / drug abuse Longest period of sobriety (when/how long): N/A    Current Medications:  Current Facility-Administered Medications  Medication Dose Route Frequency Provider Last Rate Last Admin   acetaminophen (TYLENOL) tablet 650 mg  650 mg Oral Q6H PRN Rankin, Shuvon B, NP   650 mg at 01/22/22 2126   albuterol (VENTOLIN HFA) 108 (90 Base) MCG/ACT inhaler 2 puff  2 puff Inhalation Q6H PRN Rankin, Shuvon B, NP   2 puff at 12/09/21 2102   alum & mag hydroxide-simeth (MAALOX/MYLANTA) 200-200-20 MG/5ML suspension 30 mL  30 mL Oral Q4H PRN Rankin, Shuvon B, NP   30 mL at 12/19/21 2109   apixaban (ELIQUIS) tablet 5 mg  5 mg Oral BID Rankin, Shuvon B, NP   5 mg at 01/23/22 0933   cloZAPine (CLOZARIL) tablet 50 mg  50 mg Oral Daily [name], NP   50 mg at 01/23/22 0932   cloZAPine (CLOZARIL) tablet 75 mg  75 mg Oral Dorie Rank, NP   75 mg at 01/22/22 2126   diltiazem (CARDIZEM CD) 24 hr capsule 240 mg  240 mg Oral Daily Rankin, Shuvon B, NP   240 mg at 01/23/22 0933   divalproex (DEPAKOTE ER) 24 hr tablet 500 mg  500 mg Oral BID Rankin, Shuvon B, NP   500 mg at 01/23/22 0948   fluticasone furoate-vilanterol (BREO ELLIPTA) 200-25 MCG/ACT 1 puff  1 puff Inhalation Daily Rankin, Shuvon B, NP   1 puff at 01/23/22 0839   haloperidol (HALDOL) tablet 10 mg  10 mg Oral Q8H PRN Rankin, Shuvon B, NP       insulin  aspart (novoLOG) injection 0-9 Units  0-9 Units Subcutaneous TID WC Tharon Aquas, NP   2 Units at 01/23/22 1133   insulin glargine-yfgn (SEMGLEE) injection 8 Units  8 Units Subcutaneous Daily Tharon Aquas, NP   8 Units at 01/23/22 0939   latanoprost (XALATAN) 0.005 % ophthalmic solution 1 drop  1 drop Both Eyes QHS Rankin, Shuvon B, NP   1 drop at 01/22/22 2127   magnesium hydroxide (MILK OF MAGNESIA) suspension 30 mL  30 mL Oral Daily PRN Rankin, Shuvon B, NP       menthol-cetylpyridinium (CEPACOL) lozenge 3 mg  1 lozenge Oral PRN [name], NP   3 mg at 01/22/22 2127   metFORMIN (GLUCOPHAGE-XR) 24 hr tablet 1,000  mg  1,000 mg Oral BID WC [name], DO   1,000 mg at 01/23/22 6213   multivitamins with iron tablet 1 tablet  1 tablet Oral Daily [name], DO   1 tablet at 01/23/22 0934   nicotine (NICODERM CQ - dosed in mg/24 hr) patch 7 mg  7 mg Transdermal Daily Rankin, Shuvon B, NP   7 mg at 01/23/22 0931   polyethylene glycol (MIRALAX / GLYCOLAX) packet 17 g  17 g Oral Daily [name], DO   17 g at 01/22/22 0865   Vitamin D (Ergocalciferol) (DRISDOL) 1.25 MG (50000 UNIT) capsule 50,000 Units  50,000 Units Oral Q7 days Rankin, Shuvon B, NP   50,000 Units at 01/21/22 1013   Current Outpatient Medications  Medication Sig Dispense Refill   Accu-Chek Softclix Lancets lancets Use as directed up to four times daily 100 each 0   apixaban (ELIQUIS) 5 MG TABS tablet Take 1 tablet (5 mg total) by mouth 2 (two) times daily. 60 tablet 0   ARIPiprazole (ABILIFY) 10 MG tablet Take 1 tablet (10 mg total) by mouth daily. 30 tablet 0   Blood Glucose Monitoring Suppl (ACCU-CHEK GUIDE) w/Device KIT Use as directed up to four times daily 1 kit 0   budesonide-formoterol (SYMBICORT) 160-4.5 MCG/ACT inhaler Inhale 2 puffs into the lungs in the morning and at bedtime.     Cholecalciferol (VITAMIN D3) 1.25 MG (50000 UT) CAPS Take 50,000 Units by mouth every Thursday.     cloZAPine (CLOZARIL)  25 MG tablet Take 3 tablets (75 mg total) by mouth at bedtime. 90 tablet 0   clozapine (CLOZARIL) 50 MG tablet Take 1 tablet (50 mg total) by mouth daily. 30 tablet 0   diltiazem (CARDIZEM CD) 240 MG 24 hr capsule Take 1 capsule (240 mg total) by mouth daily. (Patient not taking: Reported on 11/24/2021) 30 capsule 0   divalproex (DEPAKOTE ER) 500 MG 24 hr tablet Take 1 tablet (500 mg total) by mouth 2 (two) times daily. 60 tablet 0   glucose blood test strip Use as directed up to four times daily 50 each 0   haloperidol (HALDOL) 10 MG tablet Take 1 tablet (10 mg total) by mouth 3 (three) times daily as needed for agitation (and psychotic symptoms).     INGREZZA 40 MG capsule Take 1 capsule (40 mg total) by mouth in the morning. 30 capsule 0   insulin aspart (NOVOLOG) 100 UNIT/ML FlexPen Before each meal 3 times a day, 140-199 - 2 units, 200-250 - 4 units, 251-299 - 6 units,  300-349 - 8 units,  350 or above 10 units. Insulin PEN if approved, provide syringes and needles if needed.Please switch to any approved short acting Insulin if needed. 15 mL 0   insulin glargine (LANTUS) 100 UNIT/ML Solostar Pen Inject 12 Units into the skin daily. 15 mL 0   Insulin Pen Needle 32G X 4 MM MISC Use 4 times a day with insulin, 1 month supply. 100 each 0   latanoprost (XALATAN) 0.005 % ophthalmic solution Place 1 drop into both eyes at bedtime.     metFORMIN (GLUCOPHAGE) 500 MG tablet Take 500 mg by mouth 2 (two) times daily with a meal.     nicotine (NICODERM CQ - DOSED IN MG/24 HR) 7 mg/24hr patch Place 1 patch (7 mg total) onto the skin daily. 28 patch 0   PROAIR HFA 108 (90 Base) MCG/ACT inhaler Inhale 2 puffs into the lungs every 6 (six) hours as needed  for wheezing or shortness of breath.      Labs  Lab Results:  No results displayed because visit has over 200 results.    Admission on 11/22/2021, Discharged on 11/22/2021  Component Date Value Ref Range Status   Glucose-Capillary 11/22/2021 169 (H)  70 -  99 mg/dL Final   Glucose reference range applies only to samples taken after fasting for at least 8 hours.  No results displayed because visit has over 200 results.    Admission on 10/04/2021, Discharged on 10/22/2021  Component Date Value Ref Range Status   SARS Coronavirus 2 by RT PCR 10/04/2021 NEGATIVE  NEGATIVE Final   Comment: (NOTE) SARS-CoV-2 target nucleic acids are NOT DETECTED.  The SARS-CoV-2 RNA is generally detectable in upper respiratory specimens during the acute phase of infection. The lowest concentration of SARS-CoV-2 viral copies this assay can detect is 138 copies/mL. A negative result does not preclude SARS-Cov-2 infection and should not be used as the sole basis for treatment or other patient management decisions. A negative result may occur with  improper specimen collection/handling, submission of specimen other than nasopharyngeal swab, presence of viral mutation(s) within the areas targeted by this assay, and inadequate number of viral copies(<138 copies/mL). A negative result must be combined with clinical observations, patient history, and epidemiological information. The expected result is Negative.  Fact Sheet for Patients:  EntrepreneurPulse.com.au  Fact Sheet for Healthcare Providers:  IncredibleEmployment.be  This test is no                          t yet approved or cleared by the [place] FDA and  has been authorized for detection and/or diagnosis of SARS-CoV-2 by FDA under an Emergency Use Authorization (EUA). This EUA will remain  in effect (meaning this test can be used) for the duration of the COVID-19 declaration under Section 564(b)(1) of the Act, 21 U.S.C.section 360bbb-3(b)(1), unless the authorization is terminated  or revoked sooner.       Influenza A by PCR 10/04/2021 NEGATIVE  NEGATIVE Final   Influenza B by PCR 10/04/2021 NEGATIVE  NEGATIVE Final   Comment: (NOTE) The Xpert Xpress  SARS-CoV-2/FLU/RSV plus assay is intended as an aid in the diagnosis of influenza from Nasopharyngeal swab specimens and should not be used as a sole basis for treatment. Nasal washings and aspirates are unacceptable for Xpert Xpress SARS-CoV-2/FLU/RSV testing.  Fact Sheet for Patients: EntrepreneurPulse.com.au  Fact Sheet for Healthcare Providers: IncredibleEmployment.be  This test is not yet approved or cleared by the [place] FDA and has been authorized for detection and/or diagnosis of SARS-CoV-2 by FDA under an Emergency Use Authorization (EUA). This EUA will remain in effect (meaning this test can be used) for the duration of the COVID-19 declaration under Section 564(b)(1) of the Act, 21 U.S.C. section 360bbb-3(b)(1), unless the authorization is terminated or revoked.  Performed at [facility], [place] [address], [place], [place] [address]    WBC 10/04/2021 8.4  4.0 - 10.5 K/uL Final   RBC 10/04/2021 4.42  3.87 - 5.11 MIL/uL Final   Hemoglobin 10/04/2021 11.6 (L)  12.0 - 15.0 g/dL Final   HCT 10/04/2021 35.7 (L)  36.0 - 46.0 % Final   MCV 10/04/2021 80.8  80.0 - 100.0 fL Final   MCH 10/04/2021 26.2  26.0 - 34.0 pg Final   MCHC 10/04/2021 32.5  30.0 - 36.0 g/dL Final   RDW 10/04/2021 16.0 (H)  11.5 - 15.5 %  Final   Platelets 10/04/2021 372  150 - 400 K/uL Final   nRBC 10/04/2021 0.0  0.0 - 0.2 % Final   Neutrophils Relative % 10/04/2021 68  % Final   Neutro Abs 10/04/2021 5.7  1.7 - 7.7 K/uL Final   Lymphocytes Relative 10/04/2021 22  % Final   Lymphs Abs 10/04/2021 1.8  0.7 - 4.0 K/uL Final   Monocytes Relative 10/04/2021 8  % Final   Monocytes Absolute 10/04/2021 0.7  0.1 - 1.0 K/uL Final   Eosinophils Relative 10/04/2021 1  % Final   Eosinophils Absolute 10/04/2021 0.1  0.0 - 0.5 K/uL Final   Basophils Relative 10/04/2021 1  % Final   Basophils Absolute 10/04/2021 0.1  0.0 - 0.1 K/uL Final   Immature Granulocytes  10/04/2021 0  % Final   Abs Immature Granulocytes 10/04/2021 0.03  0.00 - 0.07 K/uL Final   Performed at [facility], [place] [address] [place], [place] [address]   Sodium 10/04/2021 136  135 - 145 mmol/L Final   Potassium 10/04/2021 4.2  3.5 - 5.1 mmol/L Final   Chloride 10/04/2021 104  98 - 111 mmol/L Final   CO2 10/04/2021 25  22 - 32 mmol/L Final   Glucose, Bld 10/04/2021 100 (H)  70 - 99 mg/dL Final   Glucose reference range applies only to samples taken after fasting for at least 8 hours.   BUN 10/04/2021 9  6 - 20 mg/dL Final   Creatinine, Ser 10/04/2021 0.52  0.44 - 1.00 mg/dL Final   Calcium 10/04/2021 9.0  8.9 - 10.3 mg/dL Final   Total Protein 10/04/2021 7.0  6.5 - 8.1 g/dL Final   Albumin 10/04/2021 3.2 (L)  3.5 - 5.0 g/dL Final   AST 10/04/2021 13 (L)  15 - 41 U/L Final   ALT 10/04/2021 10  0 - 44 U/L Final   Alkaline Phosphatase 10/04/2021 61  38 - 126 U/L Final   Total Bilirubin 10/04/2021 0.3  0.3 - 1.2 mg/dL Final   GFR, Estimated 10/04/2021 >60  >60 mL/min Final   Comment: (NOTE) Calculated using the CKD-EPI Creatinine Equation (2021)    Anion gap 10/04/2021 7  5 - 15 Final   Performed at [facility] [address] [place], [place] [address]   Hgb A1c MFr Bld 10/04/2021 7.0 (H)  4.8 - 5.6 % Final   Comment: (NOTE) Pre diabetes:          5.7%-6.4%  Diabetes:              >6.4%  Glycemic control for   <7.0% adults with diabetes    Mean Plasma Glucose 10/04/2021 154.2  mg/dL Final   Performed at [facility], [place] [address], [place], [place] [address]   Cholesterol 10/04/2021 178  0 - 200 mg/dL Final   Triglycerides 10/04/2021 155 (H)  <150 mg/dL Final   HDL 10/04/2021 52  >40 mg/dL Final   Total CHOL/HDL Ratio 10/04/2021 3.4  RATIO Final   VLDL 10/04/2021 31  0 - 40 mg/dL Final   LDL Cholesterol 10/04/2021 95  0 - 99 mg/dL Final   Comment:        Total Cholesterol/HDL:CHD Risk Coronary Heart Disease Risk Table                     Men    Women  1/2 Average Risk   3.4   3.3  Average Risk  5.0   4.4  2 X Average Risk   9.6   7.1  3 X Average Risk  23.4   11.0        Use the calculated Patient Ratio above and the CHD Risk Table to determine the patient's CHD Risk.        ATP III CLASSIFICATION (LDL):  <100     mg/dL   Optimal  100-129  mg/dL   Near or Above                    Optimal  130-159  mg/dL   Borderline  160-189  mg/dL   High  >190     mg/dL   Very High Performed at [facility] [address], [place], [place] [address]    POC Amphetamine UR 10/04/2021 None Detected  NONE DETECTED (Cut Off Level 1000 ng/mL) Final   POC Secobarbital (BAR) 10/04/2021 None Detected  NONE DETECTED (Cut Off Level 300 ng/mL) Final   POC Buprenorphine (BUP) 10/04/2021 None Detected  NONE DETECTED (Cut Off Level 10 ng/mL) Final   POC Oxazepam (BZO) 10/04/2021 None Detected  NONE DETECTED (Cut Off Level 300 ng/mL) Final   POC Cocaine UR 10/04/2021 None Detected  NONE DETECTED (Cut Off Level 300 ng/mL) Final   POC Methamphetamine UR 10/04/2021 None Detected  NONE DETECTED (Cut Off Level 1000 ng/mL) Final   POC Morphine 10/04/2021 None Detected  NONE DETECTED (Cut Off Level 300 ng/mL) Final   POC Methadone UR 10/04/2021 None Detected  NONE DETECTED (Cut Off Level 300 ng/mL) Final   POC Oxycodone UR 10/04/2021 Positive (A)  NONE DETECTED (Cut Off Level 100 ng/mL) Final   POC Marijuana UR 10/04/2021 None Detected  NONE DETECTED (Cut Off Level 50 ng/mL) Final   SARSCOV2ONAVIRUS 2 AG 10/04/2021 NEGATIVE  NEGATIVE Final   Comment: (NOTE) SARS-CoV-2 antigen NOT DETECTED.   Negative results are presumptive.  Negative results do not preclude SARS-CoV-2 infection and should not be used as the sole basis for treatment or other patient management decisions, including infection  control decisions, particularly in the presence of clinical signs and  symptoms consistent with COVID-19, or in those who have been in contact with the  virus.  Negative results must be combined with clinical observations, patient history, and epidemiological information. The expected result is Negative.  Fact Sheet for Patients: HandmadeRecipes.com.cy  Fact Sheet for Healthcare Providers: FuneralLife.at  This test is not yet approved or cleared by the [place] FDA and  has been authorized for detection and/or diagnosis of SARS-CoV-2 by FDA under an Emergency Use Authorization (EUA).  This EUA will remain in effect (meaning this test can be used) for the duration of  the COV                          ID-19 declaration under Section 564(b)(1) of the Act, 21 U.S.C. section 360bbb-3(b)(1), unless the authorization is terminated or revoked sooner.     Valproic Acid Lvl 10/04/2021 51  50.0 - 100.0 ug/mL Final   Performed at [facility] [address], [place], [place] [address]   Valproic Acid Lvl 10/08/2021 57  50.0 - 100.0 ug/mL Final   Performed at [facility] [address], [place], [place] [address]   Glucose-Capillary 10/09/2021 123 (H)  70 - 99 mg/dL Final   Glucose reference range applies only to samples taken after fasting for at least  8 hours.  Admission on 09/09/2021, Discharged on 09/10/2021  Component Date Value Ref Range Status   Sodium 09/09/2021 134 (L)  135 - 145 mmol/L Final   Potassium 09/09/2021 4.3  3.5 - 5.1 mmol/L Final   Chloride 09/09/2021 99  98 - 111 mmol/L Final   CO2 09/09/2021 25  22 - 32 mmol/L Final   Glucose, Bld 09/09/2021 107 (H)  70 - 99 mg/dL Final   Glucose reference range applies only to samples taken after fasting for at least 8 hours.   BUN 09/09/2021 12  6 - 20 mg/dL Final   Creatinine, Ser 09/09/2021 0.74  0.44 - 1.00 mg/dL Final   Calcium 09/09/2021 9.0  8.9 - 10.3 mg/dL Final   Total Protein 09/09/2021 7.4  6.5 - 8.1 g/dL Final   Albumin 09/09/2021 3.1 (L)  3.5 - 5.0 g/dL Final   AST 09/09/2021 13 (L)  15 - 41 U/L Final    ALT 09/09/2021 13  0 - 44 U/L Final   Alkaline Phosphatase 09/09/2021 66  38 - 126 U/L Final   Total Bilirubin 09/09/2021 0.2 (L)  0.3 - 1.2 mg/dL Final   GFR, Estimated 09/09/2021 >60  >60 mL/min Final   Comment: (NOTE) Calculated using the CKD-EPI Creatinine Equation (2021)    Anion gap 09/09/2021 10  5 - 15 Final   Performed at [facility], [place] [address], [place], [place] [address]   WBC 09/09/2021 8.5  4.0 - 10.5 K/uL Final   RBC 09/09/2021 4.53  3.87 - 5.11 MIL/uL Final   Hemoglobin 09/09/2021 11.8 (L)  12.0 - 15.0 g/dL Final   HCT 09/09/2021 38.0  36.0 - 46.0 % Final   MCV 09/09/2021 83.9  80.0 - 100.0 fL Final   MCH 09/09/2021 26.0  26.0 - 34.0 pg Final   MCHC 09/09/2021 31.1  30.0 - 36.0 g/dL Final   RDW 09/09/2021 17.8 (H)  11.5 - 15.5 % Final   Platelets 09/09/2021 442 (H)  150 - 400 K/uL Final   nRBC 09/09/2021 0.0  0.0 - 0.2 % Final   Neutrophils Relative % 09/09/2021 70  % Final   Neutro Abs 09/09/2021 5.9  1.7 - 7.7 K/uL Final   Lymphocytes Relative 09/09/2021 20  % Final   Lymphs Abs 09/09/2021 1.7  0.7 - 4.0 K/uL Final   Monocytes Relative 09/09/2021 8  % Final   Monocytes Absolute 09/09/2021 0.7  0.1 - 1.0 K/uL Final   Eosinophils Relative 09/09/2021 1  % Final   Eosinophils Absolute 09/09/2021 0.1  0.0 - 0.5 K/uL Final   Basophils Relative 09/09/2021 1  % Final   Basophils Absolute 09/09/2021 0.1  0.0 - 0.1 K/uL Final   Immature Granulocytes 09/09/2021 0  % Final   Abs Immature Granulocytes 09/09/2021 0.03  0.00 - 0.07 K/uL Final   Performed at [facility], [place] [address]  [place], [place] [address]   Ammonia 09/09/2021 37 (H)  9 - 35 umol/L Final   Comment: HEMOLYSIS AT THIS LEVEL MAY AFFECT RESULT Performed at [facility], [place] [address], [place], [place] [address]    Opiates 09/09/2021 NONE DETECTED  NONE DETECTED Final   Cocaine 09/09/2021 NONE DETECTED  NONE DETECTED Final   Benzodiazepines 09/09/2021 NONE DETECTED  NONE DETECTED  Final   Amphetamines 09/09/2021 NONE DETECTED  NONE DETECTED Final   Tetrahydrocannabinol 09/09/2021 NONE DETECTED  NONE DETECTED Final   Barbiturates 09/09/2021 NONE DETECTED  NONE DETECTED Final  Comment: (NOTE) DRUG SCREEN FOR MEDICAL PURPOSES ONLY.  IF CONFIRMATION IS NEEDED FOR ANY PURPOSE, NOTIFY LAB WITHIN 5 DAYS.  LOWEST DETECTABLE LIMITS FOR URINE DRUG SCREEN Drug Class                     Cutoff (ng/mL) Amphetamine and metabolites    1000 Barbiturate and metabolites    200 Benzodiazepine                 262 Tricyclics and metabolites     300 Opiates and metabolites        300 Cocaine and metabolites        300 THC                            50 Performed at [facility], [place] [address], [place], [place] [address]    Alcohol, Ethyl (B) 09/09/2021 <10  <10 mg/dL Final   Comment: (NOTE) Lowest detectable limit for serum alcohol is 10 mg/dL.  For medical purposes only. Performed at [facility], [place] [address], [place], [place] [address]    Lipase 09/09/2021 33  11 - 51 U/L Final   Performed at [facility] [address], [place], [place] [address]   Color, Urine 09/09/2021 COLORLESS (A)  YELLOW Final   APPearance 09/09/2021 CLEAR  CLEAR Final   Specific Gravity, Urine 09/09/2021 1.002 (L)  1.005 - 1.030 Final   pH 09/09/2021 6.0  5.0 - 8.0 Final   Glucose, UA 09/09/2021 NEGATIVE  NEGATIVE mg/dL Final   Hgb urine dipstick 09/09/2021 NEGATIVE  NEGATIVE Final   Bilirubin Urine 09/09/2021 NEGATIVE  NEGATIVE Final   Ketones, ur 09/09/2021 NEGATIVE  NEGATIVE mg/dL Final   Protein, ur 09/09/2021 NEGATIVE  NEGATIVE mg/dL Final   Nitrite 09/09/2021 NEGATIVE  NEGATIVE Final   Leukocytes,Ua 09/09/2021 NEGATIVE  NEGATIVE Final   Performed at [facility] [address] [place], [place] [address]   Specimen Description 09/09/2021 URINE, CLEAN CATCH   Final   Special Requests 09/09/2021 NONE   Final   Culture 09/09/2021  (A)   Final                    Value:<10,000 COLONIES/mL INSIGNIFICANT GROWTH Performed at [facility], [place] [address], [place], [place] [address]    Report Status 09/09/2021 09/10/2021 FINAL   Final   D-Dimer, Quant 09/09/2021 0.92 (H)  0.00 - 0.50 ug/mL-FEU Final   Comment: (NOTE) At the manufacturer cut-off value of 0.5 g/mL FEU, this assay has a negative predictive value of 95-100%.This assay is intended for use in conjunction with a clinical pretest probability (PTP) assessment model to exclude pulmonary embolism (PE) and deep venous thrombosis (DVT) in outpatients suspected of PE or DVT. Results should be correlated with clinical presentation. Performed at [facility], [place] [address] [place], [place] [address]    Troponin I (High Sensitivity) 09/09/2021 5  <18 ng/L Final   Comment: (NOTE) Elevated high sensitivity troponin I (hsTnI) values and significant  changes across serial measurements may suggest ACS but many other  chronic and acute conditions are known to elevate hsTnI results.  Refer to the "Links" section for chest pain algorithms and additional  guidance. Performed at [facility], [place] [address] [place],  [address]    BP 09/10/2021 135/83  mmHg Final   S' Lateral 09/10/2021  2.30  cm Final   Area-P 1/2 09/10/2021 5.58  cm2 Final    Blood Alcohol level:  Lab Results  Component Value Date   ETH <10 11/23/2021   ETH <10 32/67/1245    Metabolic Disorder Labs: Lab Results  Component Value Date   HGBA1C 6.7 (H) 12/28/2021   MPG 145.59 12/28/2021   MPG 154.2 10/04/2021   Lab Results  Component Value Date   PROLACTIN 3.8 (L) 11/23/2021   Lab Results  Component Value Date   CHOL 171 11/23/2021   TRIG 125 11/23/2021   HDL 65 11/23/2021   CHOLHDL 2.6 11/23/2021   VLDL 25 11/23/2021   LDLCALC 81 11/23/2021   [place] 95 10/04/2021    Therapeutic Lab Levels: No results found for: "LITHIUM" Lab Results  Component Value Date   VALPROATE 33 (L)  01/18/2022   VALPROATE 33 (L) 12/28/2021   No results found for: "CBMZ"  Physical Findings   PHQ2-9    Flowsheet Row ED from 11/23/2021 in [facility]  PHQ-2 Total Score 2  PHQ-9 Total Score 3      Flowsheet Row ED from 11/23/2021 in [facility] Most recent reading at 01/23/2022 11:52 AM ED from 11/22/2021 in [place] [facility] Most recent reading at 11/22/2021  8:28 PM ED from 11/22/2021 in [place] [facility] Most recent reading at 11/22/2021  1:24 AM  C-SSRS RISK CATEGORY No Risk No Risk No Risk        Musculoskeletal  Strength & Muscle Tone: within normal limits Gait & Station: normal Patient leans: N/A  Psychiatric Specialty Exam  Presentation  General Appearance:  Appropriate for Environment  Eye Contact: Fair  Speech: Clear and Coherent  Speech Volume: Normal  Handedness: Right   Mood and Affect  Mood: Dysphoric  Affect: Congruent   Thought Process  Thought Processes: Coherent  Descriptions of Associations:Intact  Orientation:Full (Time, Place and Person)  Thought Content:Logical  Diagnosis of Schizophrenia or Schizoaffective disorder in past: Yes  Duration of Psychotic Symptoms: No data recorded  Hallucinations:Hallucinations: None  Ideas of Reference:None  Suicidal Thoughts:Suicidal Thoughts: No  Homicidal Thoughts:Homicidal Thoughts: No   Sensorium  Memory: Immediate Fair; Recent Fair; Remote Fair  Judgment: Intact  Insight: Present   Executive Functions  Concentration: Fair  Attention Span: Fair  Recall: [employer] of Knowledge: Fair  Language: Fair   Psychomotor Activity  Psychomotor Activity: Psychomotor Activity: Normal   Assets  Assets: Communication Skills; Desire for Improvement   Sleep  Sleep: Sleep: Fair Number of Hours of Sleep: 8   Physical Exam  Physical Exam HENT:      Nose: Nose normal.  Cardiovascular:     Rate and Rhythm: Normal rate.  Pulmonary:     Effort: Pulmonary effort is normal.  Musculoskeletal:        General: Normal range of motion.  Neurological:     Mental Status: She is alert and oriented to person, place, and time.    Review of Systems  Constitutional: Negative.   HENT: Negative.    Eyes: Negative.   Respiratory: Negative.    Gastrointestinal: Negative.   Musculoskeletal: Negative.   Neurological: Negative.   Endo/Heme/Allergies: Negative.    Blood pressure 126/77, pulse 91, temperature 98 F (36.7 C), resp. rate 18, SpO2 98 %. There is no height or weight on file to calculate BMI.  Treatment Plan Summary:  Patient sent to the [facility] today for medical work up.  Patient to return back to the [facility] once medically stable.   Patient has been psychiatrically cleared and does not meet criteria for inpatient psychiatric treatment. Patient currently boarding at the [facility]. She is unable to return to group home unless she has been assigned a guardian. She is unable to care for herself and has been boarding on the continuous assessment unit while awaiting DSS caseworker to find appropriate placement. Social work/TOC working with DSS to find placement. Per Boris Lown, LCSW, Per Kevin Fenton, [facility] social work supervisor, their attorney determined the patient has the capacity to make placement decisions and they will not be seeking guardianship.    Per chart review, AID to Capacity Evaluation (ACE) completed by Dr. Louis Meckel, Dr. Dwyane Dee on 12/11/2021.  Please see media tab for full details.    [name], PhD completed: Wechsler Adult Intelligence Scale-4, Ms. 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  Kolbey Teichert L, NP 01/23/2022 2:00 PM

## 2022-01-23 NOTE — ED Notes (Signed)
Patient denies SI/HI and AVH. Patient reports she is not feeling well today. Patient has refused prn's that could help. Patient received her medication and breakfast. Patient is being monitored for safety.

## 2022-01-23 NOTE — ED Notes (Signed)
Pt sleeping at present, no distress noted.  Monitoring for safety. 

## 2022-01-23 NOTE — ED Provider Notes (Addendum)
Teresa Murillo is a 61 year old female patient with a history significant for schizophrenia, borderline IDD, atrial flutter with rapid ventricular response, hypertension, diabetes type 2, and COPD.   Patient reevaluated this afternoon for c/o of body aches. On exam patient c/o generalized body aches, generalized weakness, medial chest pain, dizziness, sore throat and runny nose. She reports intermittent body aches for a week. She reports that her throat started hurting last night. She denies difficulty swallowing. She denies SOB. She denies coughs. She reports intermittent chest pain today and rates her pain 8/10 with 10 being worst. States that she has A-fib. She is able to move upper and lower extremities on exam but states that she does not feel good and does not feel like standing up. Patient transferred to [facility] for medical evaluation. Report called to Dr. Fredderick Phenix at [facility].   Vital signs  HR 132 O2 97% RA  B/P 120/70 Temp 97.6  Dr. Gasper Sells recommends obtaining a CBC w/diff while in the ED.

## 2022-01-23 NOTE — ED Provider Notes (Signed)
[facility] EMERGENCY DEPARTMENT Provider Note   CSN: [id number] Arrival date & time: 01/23/22  1421     History  Chief Complaint  Patient presents with   Chest Pain    Teresa Murillo is a 61 y.o. female.  61 year old female presents with several days of URI symptoms consisting of runny nose, chest congestion, sore throat.  Subjective myalgias without recorded temperature.  No vomiting or diarrhea.  Patient has had chest discomfort that is worse when she coughs.  Patient currently inpatient at behavioral health waiting for new group home placement.  Has history of schizophrenia.  Was found to have heart rate 136 and patient sent here for further evaluation       Home Medications Prior to Admission medications   Medication Sig Start Date End Date Taking? Authorizing Provider  Accu-Chek Softclix Lancets lancets Use as directed up to four times daily 11/16/21   Thurnell Lose, MD  apixaban (ELIQUIS) 5 MG TABS tablet Take 1 tablet (5 mg total) by mouth 2 (two) times daily. 11/16/21   Thurnell Lose, MD  ARIPiprazole (ABILIFY) 10 MG tablet Take 1 tablet (10 mg total) by mouth daily. 11/17/21   Thurnell Lose, MD  Blood Glucose Monitoring Suppl (ACCU-CHEK GUIDE) w/Device KIT Use as directed up to four times daily 11/16/21   Thurnell Lose, MD  budesonide-formoterol [facility]) 160-4.5 MCG/ACT inhaler Inhale 2 puffs into the lungs in the morning and at bedtime. 01/08/21   [provider]  Cholecalciferol (VITAMIN D3) 1.25 MG (50000 UT) CAPS Take 50,000 Units by mouth every Thursday.    [provider]  cloZAPine (CLOZARIL) 25 MG tablet Take 3 tablets (75 mg total) by mouth at bedtime. 11/16/21   Thurnell Lose, MD  clozapine (CLOZARIL) 50 MG tablet Take 1 tablet (50 mg total) by mouth daily. 11/17/21   Thurnell Lose, MD  diltiazem (CARDIZEM CD) 240 MG 24 hr capsule Take 1 capsule (240 mg total) by mouth daily. Patient not taking: Reported  on 11/24/2021 11/17/21   Thurnell Lose, MD  divalproex (DEPAKOTE ER) 500 MG 24 hr tablet Take 1 tablet (500 mg total) by mouth 2 (two) times daily. 11/16/21   Thurnell Lose, MD  glucose blood test strip Use as directed up to four times daily 11/16/21   Thurnell Lose, MD  haloperidol (HALDOL) 10 MG tablet Take 1 tablet (10 mg total) by mouth 3 (three) times daily as needed for agitation (and psychotic symptoms). 11/16/21   Thurnell Lose, MD  INGREZZA 40 MG capsule Take 1 capsule (40 mg total) by mouth in the morning. 11/16/21   Thurnell Lose, MD  insulin aspart (NOVOLOG) 100 UNIT/ML FlexPen Before each meal 3 times a day, 140-199 - 2 units, 200-250 - 4 units, 251-299 - 6 units,  300-349 - 8 units,  350 or above 10 units. Insulin PEN if approved, provide syringes and needles if needed.Please switch to any approved short acting Insulin if needed. 11/16/21   Thurnell Lose, MD  insulin glargine (LANTUS) 100 UNIT/ML Solostar Pen Inject 12 Units into the skin daily. 11/17/21   Thurnell Lose, MD  Insulin Pen Needle 32G X 4 MM MISC Use 4 times a day with insulin, 1 month supply. 11/16/21   Thurnell Lose, MD  latanoprost (XALATAN) 0.005 % ophthalmic solution Place 1 drop into both eyes at bedtime.    [provider]  metFORMIN (GLUCOPHAGE) 500 MG tablet Take 500  mg by mouth 2 (two) times daily with a meal.    [provider]  nicotine (NICODERM CQ - DOSED IN MG/24 HR) 7 mg/24hr patch Place 1 patch (7 mg total) onto the skin daily. 10/22/21   Corky Sox, MD  PROAIR HFA 108 [phone] Base) MCG/ACT inhaler Inhale 2 puffs into the lungs every 6 (six) hours as needed for wheezing or shortness of breath.    [provider]      Allergies    Penicillins and Penicillin g    Review of Systems   Review of Systems  All other systems reviewed and are negative.   Physical Exam Updated Vital Signs BP 134/88   Pulse 100   Temp 98.8 F (37.1 C) (Oral)   Resp 18    SpO2 98%  Physical Exam Vitals and nursing note reviewed.  Constitutional:      General: She is not in acute distress.    Appearance: Normal appearance. She is well-developed. She is not toxic-appearing.  HENT:     Head: Normocephalic and atraumatic.     Mouth/Throat:     Pharynx: No posterior oropharyngeal erythema.  Eyes:     General: Lids are normal.     Conjunctiva/sclera: Conjunctivae normal.     Pupils: Pupils are equal, round, and reactive to light.  Neck:     Thyroid: No thyroid mass.     Trachea: No tracheal deviation.  Cardiovascular:     Rate and Rhythm: Normal rate and regular rhythm.     Heart sounds: Normal heart sounds. No murmur heard.    No gallop.  Pulmonary:     Effort: Pulmonary effort is normal. No respiratory distress.     Breath sounds: Normal breath sounds. No stridor. No decreased breath sounds, wheezing, rhonchi or rales.  Abdominal:     General: There is no distension.     Palpations: Abdomen is soft.     Tenderness: There is no abdominal tenderness. There is no rebound.  Musculoskeletal:        General: No tenderness. Normal range of motion.     Cervical back: Normal range of motion and neck supple.  Skin:    General: Skin is warm and dry.     Findings: No abrasion or rash.  Neurological:     Mental Status: She is alert and oriented to person, place, and time. Mental status is at baseline.     GCS: GCS eye subscore is 4. GCS verbal subscore is 5. GCS motor subscore is 6.     Cranial Nerves: No cranial nerve deficit.     Sensory: No sensory deficit.     Motor: Motor function is intact.  Psychiatric:        Attention and Perception: Attention normal.        Mood and Affect: Affect is labile.        Speech: Speech normal.        Behavior: Behavior normal.     ED Results / Procedures / Treatments   Labs (all labs ordered are listed, but only abnormal results are displayed) Labs Reviewed  BASIC METABOLIC PANEL - Abnormal; Notable for the  following components:      Result Value   Glucose, Bld 198 (*)    All other components within normal limits  CBC - Abnormal; Notable for the following components:   Hemoglobin 11.7 (*)    MCH 25.3 (*)    RDW 17.9 (*)    All other components  within normal limits  SARS CORONAVIRUS 2 BY RT PCR  GROUP A STREP BY PCR  DIFFERENTIAL  BRAIN NATRIURETIC PEPTIDE  URINALYSIS, ROUTINE W REFLEX MICROSCOPIC  TROPONIN I (HIGH SENSITIVITY)  TROPONIN I (HIGH SENSITIVITY)    EKG EKG Interpretation  Date/Time:  Saturday January 23 2022 14:29:52 EST Ventricular Rate:  129 PR Interval:  142 QRS Duration: 64 QT Interval:  298 QTC Calculation: 436 R Axis:   57 Text Interpretation: Sinus tachycardia Cannot rule out Anterior infarct , age undetermined Abnormal ECG When compared with ECG of 22-Jan-2022 14:08, PREVIOUS ECG IS PRESENT Confirmed by Lacretia Leigh ([id number]) on 01/23/2022 9:38:46 PM  Radiology DG Chest 2 View  Result Date: 01/23/2022 CLINICAL DATA:  Chest pain EXAM: CHEST - 2 VIEW COMPARISON:  Radiograph 11/08/2021 FINDINGS: Cardiomegaly and pulmonary vascular congestion. Low lung volumes. No focal consolidation, pleural effusion, or pneumothorax. No acute osseous abnormality. IMPRESSION: Cardiomegaly and pulmonary vascular congestion. Electronically Signed   By: Placido Sou M.D.   On: 01/23/2022 15:12    Procedures Procedures    Medications Ordered in ED Medications - No data to display  ED Course/ Medical Decision Making/ A&P                           Medical Decision Making Amount and/or Complexity of Data Reviewed Labs: ordered. Radiology: ordered.  Patient's EKG per interpretation shows no signs of acute schema changes.  Her electrolytes are significant for slightly hyperglycemia with sugar 198.  BNP is negative here.  No suspicion for CHF at this time.  Patient's chest x-rays were reviewed and no evidence of severe CHF. Patient's symptoms not concerning for ACS or PE.   Heart rate has come down now that she is more relaxed.  Troponins are flat x 2.  Patient had been complaining of sore throat and strep test and COVID are both negative here.  Patient complaining of having URI symptoms and feel that this was likely the etiology of her current complaints.  Patient continues to complain of having some shortness of breath.  Will order CT of the chest to rule out PE.  Will sign out to next provider        Final Clinical Impression(s) / ED Diagnoses Final diagnoses:  None    Rx / DC Orders ED Discharge Orders     None         Lacretia Leigh, MD 01/23/22 2335

## 2022-01-23 NOTE — ED Triage Notes (Signed)
Patient BIB [facility] from [facility] for evaluation of chest pain that started in September this year. EMS reports patient refused aspirin while in transport. Patient is alert, oriented, and in no apparent distress at this time.

## 2022-01-24 ENCOUNTER — Ambulatory Visit (HOSPITAL_COMMUNITY)
Admission: EM | Admit: 2022-01-24 | Discharge: 2022-02-08 | Disposition: A | Discharge status: Other Acute Inpatient Hospital | Payer: No Typology Code available for payment source | Attending: Psychiatry | Admitting: Psychiatry

## 2022-01-24 ENCOUNTER — Emergency Department (HOSPITAL_COMMUNITY): Payer: Medicaid Other

## 2022-01-24 DIAGNOSIS — Z91199 Patient's noncompliance with other medical treatment and regimen due to unspecified reason: Secondary | ICD-10-CM | POA: Insufficient documentation

## 2022-01-24 DIAGNOSIS — F209 Schizophrenia, unspecified: Secondary | ICD-10-CM | POA: Insufficient documentation

## 2022-01-24 DIAGNOSIS — Z9189 Other specified personal risk factors, not elsewhere classified: Secondary | ICD-10-CM

## 2022-01-24 DIAGNOSIS — R000 Tachycardia, unspecified: Secondary | ICD-10-CM

## 2022-01-24 DIAGNOSIS — E119 Type 2 diabetes mellitus without complications: Secondary | ICD-10-CM | POA: Insufficient documentation

## 2022-01-24 DIAGNOSIS — R079 Chest pain, unspecified: Secondary | ICD-10-CM | POA: Diagnosis present

## 2022-01-24 LAB — CBC WITH DIFFERENTIAL/PLATELET
Abs Immature Granulocytes: 0.02 10*3/uL (ref 0.00–0.07)
Basophils Absolute: 0.1 10*3/uL (ref 0.0–0.1)
Basophils Relative: 1 %
Eosinophils Absolute: 0.1 10*3/uL (ref 0.0–0.5)
Eosinophils Relative: 2 %
HCT: 37 % (ref 36.0–46.0)
Hemoglobin: 11.7 g/dL — ABNORMAL LOW (ref 12.0–15.0)
Immature Granulocytes: 0 %
Lymphocytes Relative: 33 %
Lymphs Abs: 2 10*3/uL (ref 0.7–4.0)
MCH: 25.4 pg — ABNORMAL LOW (ref 26.0–34.0)
MCHC: 31.6 g/dL (ref 30.0–36.0)
MCV: 80.4 fL (ref 80.0–100.0)
Monocytes Absolute: 0.7 10*3/uL (ref 0.1–1.0)
Monocytes Relative: 11 %
Neutro Abs: 3.3 10*3/uL (ref 1.7–7.7)
Neutrophils Relative %: 53 %
Platelets: 334 10*3/uL (ref 150–400)
RBC: 4.6 MIL/uL (ref 3.87–5.11)
RDW: 17.6 % — ABNORMAL HIGH (ref 11.5–15.5)
WBC: 6.1 10*3/uL (ref 4.0–10.5)
nRBC: 0 % (ref 0.0–0.2)

## 2022-01-24 LAB — RESP PANEL BY RT-PCR (FLU A&B, COVID) ARPGX2
Influenza A by PCR: NEGATIVE
Influenza B by PCR: NEGATIVE
SARS Coronavirus 2 by RT PCR: NEGATIVE

## 2022-01-24 LAB — CBG MONITORING, ED: Glucose-Capillary: 110 mg/dL — ABNORMAL HIGH (ref 70–99)

## 2022-01-24 LAB — GLUCOSE, CAPILLARY
Glucose-Capillary: 143 mg/dL — ABNORMAL HIGH (ref 70–99)
Glucose-Capillary: 120 mg/dL — ABNORMAL HIGH (ref 70–99)

## 2022-01-24 MED ORDER — FUROSEMIDE 10 MG/ML IJ SOLN
40.0000 mg | Freq: Once | INTRAMUSCULAR | Status: AC
  Administered 2022-01-24: 40 mg via INTRAVENOUS
  Filled 2022-01-24: qty 4

## 2022-01-24 MED ORDER — LATANOPROST 0.005 % OP SOLN
1.0000 [drp] | Freq: Every day | OPHTHALMIC | Status: DC
  Administered 2022-01-24 – 2022-02-07 (×13): 1 [drp] via OPHTHALMIC
  Filled 2022-01-24: qty 2.5

## 2022-01-24 MED ORDER — INSULIN ASPART 100 UNIT/ML IJ SOLN
0.0000 [IU] | Freq: Three times a day (TID) | INTRAMUSCULAR | Status: DC
  Administered 2022-01-24: 1 [IU] via SUBCUTANEOUS
  Administered 2022-01-25 (×2): 2 [IU] via SUBCUTANEOUS
  Administered 2022-01-25 – 2022-01-26 (×2): 1 [IU] via SUBCUTANEOUS
  Administered 2022-01-26: 2 [IU] via SUBCUTANEOUS
  Administered 2022-01-26: 1 [IU] via SUBCUTANEOUS
  Administered 2022-01-27: 3 [IU] via SUBCUTANEOUS
  Administered 2022-01-27: 2 [IU] via SUBCUTANEOUS
  Administered 2022-01-28: 1 [IU] via SUBCUTANEOUS
  Administered 2022-01-29: 2 [IU] via SUBCUTANEOUS
  Administered 2022-01-29: 1 [IU] via SUBCUTANEOUS
  Administered 2022-01-29: 3 [IU] via SUBCUTANEOUS
  Administered 2022-01-30: 1 [IU] via SUBCUTANEOUS
  Administered 2022-01-30: 2 [IU] via SUBCUTANEOUS
  Administered 2022-01-30: 1 [IU] via SUBCUTANEOUS
  Administered 2022-01-31: 2 [IU] via SUBCUTANEOUS
  Administered 2022-01-31 (×2): 1 [IU] via SUBCUTANEOUS
  Administered 2022-02-01: 3 [IU] via SUBCUTANEOUS
  Administered 2022-02-01: 2 [IU] via SUBCUTANEOUS
  Administered 2022-02-02 – 2022-02-03 (×2): 1 [IU] via SUBCUTANEOUS
  Administered 2022-02-03: 3 [IU] via SUBCUTANEOUS
  Administered 2022-02-03 – 2022-02-04 (×3): 1 [IU] via SUBCUTANEOUS
  Administered 2022-02-05 (×2): 2 [IU] via SUBCUTANEOUS
  Administered 2022-02-06: 1 [IU] via SUBCUTANEOUS
  Administered 2022-02-06: 2 [IU] via SUBCUTANEOUS
  Administered 2022-02-07: 1 [IU] via SUBCUTANEOUS
  Administered 2022-02-07: 2 [IU] via SUBCUTANEOUS
  Administered 2022-02-07: 3 [IU] via SUBCUTANEOUS
  Administered 2022-02-08: 1 [IU] via SUBCUTANEOUS

## 2022-01-24 MED ORDER — HALOPERIDOL 5 MG PO TABS: 10.0000 mg | ORAL_TABLET | Freq: Three times a day (TID) | ORAL | Status: DC | PRN

## 2022-01-24 MED ORDER — METFORMIN HCL ER 500 MG PO TB24
1000.0000 mg | ORAL_TABLET | Freq: Two times a day (BID) | ORAL | Status: DC
  Administered 2022-01-24 – 2022-02-08 (×30): 1000 mg via ORAL
  Filled 2022-01-24 (×30): qty 2

## 2022-01-24 MED ORDER — TRAZODONE HCL 50 MG PO TABS
50.0000 mg | ORAL_TABLET | Freq: Every evening | ORAL | Status: DC | PRN
  Administered 2022-01-27 – 2022-02-07 (×5): 50 mg via ORAL
  Filled 2022-01-24 (×5): qty 1

## 2022-01-24 MED ORDER — VITAMIN D (ERGOCALCIFEROL) 1.25 MG (50000 UNIT) PO CAPS
50000.0000 [IU] | ORAL_CAPSULE | ORAL | Status: DC
  Administered 2022-01-28 – 2022-02-04 (×2): 50000 [IU] via ORAL
  Filled 2022-01-24 (×3): qty 1

## 2022-01-24 MED ORDER — IOHEXOL 350 MG/ML SOLN
75.0000 mL | Freq: Once | INTRAVENOUS | Status: AC | PRN
  Administered 2022-01-24: 75 mL via INTRAVENOUS

## 2022-01-24 MED ORDER — DILTIAZEM HCL ER COATED BEADS 120 MG PO CP24
240.0000 mg | ORAL_CAPSULE | Freq: Every day | ORAL | Status: DC
  Administered 2022-01-25 – 2022-02-08 (×15): 240 mg via ORAL
  Filled 2022-01-24 (×15): qty 2

## 2022-01-24 MED ORDER — CLOZAPINE 25 MG PO TABS
75.0000 mg | ORAL_TABLET | Freq: Every day | ORAL | Status: DC
  Administered 2022-01-24 – 2022-02-07 (×15): 75 mg via ORAL
  Filled 2022-01-24 (×15): qty 3

## 2022-01-24 MED ORDER — ALBUTEROL SULFATE HFA 108 (90 BASE) MCG/ACT IN AERS
2.0000 | INHALATION_SPRAY | Freq: Four times a day (QID) | RESPIRATORY_TRACT | Status: DC | PRN
  Administered 2022-02-05: 2 via RESPIRATORY_TRACT
  Filled 2022-01-24: qty 6.7

## 2022-01-24 MED ORDER — FLUTICASONE FUROATE-VILANTEROL 200-25 MCG/ACT IN AEPB
1.0000 | INHALATION_SPRAY | Freq: Every day | RESPIRATORY_TRACT | Status: DC
  Administered 2022-01-24 – 2022-02-08 (×15): 1 via RESPIRATORY_TRACT
  Filled 2022-01-24: qty 28

## 2022-01-24 MED ORDER — VALBENAZINE TOSYLATE 40 MG PO CAPS
40.0000 mg | ORAL_CAPSULE | Freq: Every morning | ORAL | Status: DC
  Administered 2022-01-24 – 2022-02-08 (×16): 40 mg via ORAL
  Filled 2022-01-24 (×16): qty 1

## 2022-01-24 MED ORDER — ARIPIPRAZOLE 10 MG PO TABS
10.0000 mg | ORAL_TABLET | Freq: Every day | ORAL | Status: DC
  Administered 2022-01-24 – 2022-02-08 (×16): 10 mg via ORAL
  Filled 2022-01-24 (×16): qty 1

## 2022-01-24 MED ORDER — DIVALPROEX SODIUM ER 500 MG PO TB24
500.0000 mg | ORAL_TABLET | Freq: Two times a day (BID) | ORAL | Status: DC
  Administered 2022-01-24 – 2022-02-08 (×30): 500 mg via ORAL
  Filled 2022-01-24 (×30): qty 1

## 2022-01-24 MED ORDER — ACETAMINOPHEN 325 MG PO TABS
650.0000 mg | ORAL_TABLET | Freq: Four times a day (QID) | ORAL | Status: DC | PRN
  Administered 2022-01-25 – 2022-02-07 (×13): 650 mg via ORAL
  Filled 2022-01-24 (×13): qty 2

## 2022-01-24 MED ORDER — CLOZAPINE 25 MG PO TABS
50.0000 mg | ORAL_TABLET | Freq: Every day | ORAL | Status: DC
  Administered 2022-01-25 – 2022-02-08 (×15): 50 mg via ORAL
  Filled 2022-01-24 (×15): qty 2

## 2022-01-24 MED ORDER — INSULIN GLARGINE-YFGN 100 UNIT/ML ~~LOC~~ SOLN
8.0000 [IU] | Freq: Every day | SUBCUTANEOUS | Status: DC
  Administered 2022-01-24 – 2022-02-01 (×9): 8 [IU] via SUBCUTANEOUS

## 2022-01-24 MED ORDER — DILTIAZEM HCL ER COATED BEADS 240 MG PO CP24
240.0000 mg | ORAL_CAPSULE | Freq: Every day | ORAL | Status: DC
  Administered 2022-01-24: 240 mg via ORAL
  Filled 2022-01-24: qty 1

## 2022-01-24 MED ORDER — HYDROXYZINE HCL 25 MG PO TABS
25.0000 mg | ORAL_TABLET | Freq: Three times a day (TID) | ORAL | Status: DC | PRN
  Administered 2022-01-27 – 2022-02-07 (×4): 25 mg via ORAL
  Filled 2022-01-24 (×4): qty 1

## 2022-01-24 MED ORDER — APIXABAN 5 MG PO TABS
5.0000 mg | ORAL_TABLET | Freq: Two times a day (BID) | ORAL | Status: DC
  Administered 2022-01-24 – 2022-02-08 (×30): 5 mg via ORAL
  Filled 2022-01-24 (×31): qty 1

## 2022-01-24 MED ORDER — MAGNESIUM HYDROXIDE 400 MG/5ML PO SUSP: 30.0000 mL | Freq: Every day | ORAL | Status: DC | PRN

## 2022-01-24 MED ORDER — NICOTINE 7 MG/24HR TD PT24
7.0000 mg | MEDICATED_PATCH | Freq: Every day | TRANSDERMAL | Status: DC
  Administered 2022-01-25 – 2022-02-08 (×15): 7 mg via TRANSDERMAL
  Filled 2022-01-24 (×15): qty 1

## 2022-01-24 MED ORDER — ALUM & MAG HYDROXIDE-SIMETH 200-200-20 MG/5ML PO SUSP
30.0000 mL | ORAL | Status: DC | PRN
  Administered 2022-02-03 – 2022-02-05 (×3): 30 mL via ORAL
  Filled 2022-01-24 (×3): qty 30

## 2022-01-24 NOTE — ED Provider Notes (Signed)
I assumed care in signout to follow-up on imaging.  CT chest negative for PE, but she does have evidence of edema as well as pericardial effusion.  She has brief drops in her pulse ox at rest, but is tachycardic with ambulation up to the 120s.  Patient will be admitted for diuresis and monitoring.  She may need to undergo repeat echocardiogram.  Discussed with Dr. Loney Loh for admission   Zadie Rhine, MD 01/24/22 [phone]

## 2022-01-24 NOTE — ED Notes (Addendum)
CBC sent. HR 105 lying, up to 139 Standing/ambulating. Admitting notified. BS 110, given OJ per request.

## 2022-01-24 NOTE — ED Notes (Signed)
Pt A&O x 4, no distress noted., calm & cooperative, monitoring for safety. 

## 2022-01-24 NOTE — ED Notes (Signed)
Pending breakfast, dietary reminded.

## 2022-01-24 NOTE — Discharge Instructions (Signed)
Go back to [facility].  Return for worsening difficulty breathing.  Follow up with your family doc.

## 2022-01-24 NOTE — Consult Note (Signed)
ER Consult Note   Patient: Teresa Murillo GQQ:[id number] DOB: [birth year] DOA: 01/23/2022 DOS: the patient was seen and examined on 01/24/2022 PCP: Ileene Hutchinson, PA  Patient coming from: [facility]; NOK: Teresa Murillo, [place]   Chief Complaint: CP  HPI: Teresa Murillo is a 61 y.o. female with medical history significant of COPD, glaucoma, HTN, HLD, DM, and schizoaffective d/o presenting with chest pain.  She is a wandering historian but appears to have developed generalized pain and then CP yesterday, prompting [facility] to send her in for evaluation.  She reports that she has had CP for maybe 6 months.  She told Dr. Zenia Resides that she has had several days of URI symptoms and that the CP is worse with coughing; she did not report this to me.  At the time of my evaluation, she reported hunger only - no pain, no SOB, no other symptoms.  She reported that she felt ready to return to [facility].    ER Course:  Carryover, per Dr. Marlowe Sax:  61 year old female with history of COPD, hypertension, hyperlipidemia, diabetes, schizophrenia presenting from behavioral health urgent care with chest pain and shortness of breath.  Troponin negative x 2.  CT angiogram negative for PE but showing moderate pericardial effusion and possible edema.  Her BNP is normal.  She was given IV Lasix 40 mg.  Becomes tachycardic and tachypneic with ambulation.       Review of Systems: As mentioned in the history of present illness. All other systems reviewed and are negative. 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 On the Wechsler Adult Intelligence Scale-4, Teresa Murillo achieved a full-scale IQ score of 73 and a percentile rank of 4 placing her in the borderline range of intellectual functioning.    Chronic obstructive pulmonary disease (COPD) (HCC)    Glaucoma    Hyperlipidemia    Hypertension    Iron deficiency     Schizoaffective disorder (HCC)    Type 2 diabetes mellitus ([place])    Past Surgical History:  Procedure Laterality Date   TUBAL LIGATION     Social History:  reports that she has been smoking cigars and cigarettes. She has been smoking an average of 1 pack per day. She uses smokeless tobacco. She reports current alcohol use. She reports that she does not currently use drugs.  Allergies  Allergen Reactions   Penicillins Other (See Comments)    Reaction not known at group home   Penicillin G Other (See Comments)    Family History  Problem Relation Age of Onset   Breast cancer Maternal Grandmother     Prior to Admission medications   Medication Sig Start Date End Date Taking? Authorizing Provider  Accu-Chek Softclix Lancets lancets Use as directed up to four times daily 11/16/21   Thurnell Lose, MD  apixaban (ELIQUIS) 5 MG TABS tablet Take 1 tablet (5 mg total) by mouth 2 (two) times daily. 11/16/21   Thurnell Lose, MD  ARIPiprazole (ABILIFY) 10 MG tablet Take 1 tablet (10 mg total) by mouth daily. 11/17/21   Thurnell Lose, MD  Blood Glucose Monitoring Suppl (ACCU-CHEK GUIDE) w/Device KIT Use as directed up to four times daily 11/16/21   Thurnell Lose, MD  budesonide-formoterol [facility]) 160-4.5 MCG/ACT inhaler Inhale 2 puffs into the lungs in the morning and at bedtime. 01/08/21   [provider]  Cholecalciferol (VITAMIN D3) 1.25 MG (50000 UT) CAPS Take  50,000 Units by mouth every Thursday.    [provider]  cloZAPine (CLOZARIL) 25 MG tablet Take 3 tablets (75 mg total) by mouth at bedtime. 11/16/21   Thurnell Lose, MD  clozapine (CLOZARIL) 50 MG tablet Take 1 tablet (50 mg total) by mouth daily. 11/17/21   Thurnell Lose, MD  diltiazem (CARDIZEM CD) 240 MG 24 hr capsule Take 1 capsule (240 mg total) by mouth daily. Patient not taking: Reported on 11/24/2021 11/17/21   Thurnell Lose, MD  divalproex (DEPAKOTE ER) 500 MG 24 hr tablet Take 1  tablet (500 mg total) by mouth 2 (two) times daily. 11/16/21   Thurnell Lose, MD  glucose blood test strip Use as directed up to four times daily 11/16/21   Thurnell Lose, MD  haloperidol (HALDOL) 10 MG tablet Take 1 tablet (10 mg total) by mouth 3 (three) times daily as needed for agitation (and psychotic symptoms). 11/16/21   Thurnell Lose, MD  INGREZZA 40 MG capsule Take 1 capsule (40 mg total) by mouth in the morning. 11/16/21   Thurnell Lose, MD  insulin aspart (NOVOLOG) 100 UNIT/ML FlexPen Before each meal 3 times a day, 140-199 - 2 units, 200-250 - 4 units, 251-299 - 6 units,  300-349 - 8 units,  350 or above 10 units. Insulin PEN if approved, provide syringes and needles if needed.Please switch to any approved short acting Insulin if needed. 11/16/21   Thurnell Lose, MD  insulin glargine (LANTUS) 100 UNIT/ML Solostar Pen Inject 12 Units into the skin daily. 11/17/21   Thurnell Lose, MD  Insulin Pen Needle 32G X 4 MM MISC Use 4 times a day with insulin, 1 month supply. 11/16/21   Thurnell Lose, MD  latanoprost (XALATAN) 0.005 % ophthalmic solution Place 1 drop into both eyes at bedtime.    [provider]  metFORMIN (GLUCOPHAGE) 500 MG tablet Take 500 mg by mouth 2 (two) times daily with a meal.    [provider]  nicotine (NICODERM CQ - DOSED IN MG/24 HR) 7 mg/24hr patch Place 1 patch (7 mg total) onto the skin daily. 10/22/21   Corky Sox, MD  PROAIR HFA 108 [phone] Base) MCG/ACT inhaler Inhale 2 puffs into the lungs every 6 (six) hours as needed for wheezing or shortness of breath.    [provider]    Physical Exam: Vitals:   01/23/22 2203 01/24/22 0233 01/24/22 0432 01/24/22 0453  BP: 134/88 116/76  116/75  Pulse: 100 (!) 101  (!) 103  Resp: _0 Temp: 98.8 F (37.1 C) 97.6 F (36.4 C) 97.8 F (36.6 C) 98.2 F (36.8 C)  TempSrc: Oral Oral Oral Oral  SpO2: 98% 95%  95%   General:  Appears calm and comfortable and is in  NAD Eyes:   EOMI, normal lids, iris ENT:  grossly normal hearing, lips & tongue, mmm Neck:  no LAD, masses or thyromegaly Cardiovascular:  RR with mild tachycardia, no m/r/g. No LE edema.  Respiratory:   CTA bilaterally with no wheezes/rales/rhonchi.  Normal respiratory effort on RA. Abdomen:  soft, NT, ND Skin:  no rash or induration seen on limited exam Musculoskeletal:  grossly normal tone BUE/BLE, good ROM, no bony abnormality Psychiatric:  blunted mood and affect, speech fluent and tangential, AOx3 Neurologic:  CN 2-12 grossly intact, moves all extremities in coordinated fashion   Radiological Exams on Admission: Independently reviewed - see discussion in A/P where applicable  CT Angio Chest PE W/Cm &/Or Wo Cm  Result Date: 01/24/2022 CLINICAL DATA:  Pulmonary embolism suspected, high probability. Chest pain. EXAM: CT ANGIOGRAPHY CHEST WITH CONTRAST TECHNIQUE: Multidetector CT imaging of the chest was performed using the standard protocol during bolus administration of intravenous contrast. Multiplanar CT image reconstructions and MIPs were obtained to evaluate the vascular anatomy. RADIATION DOSE REDUCTION: This exam was performed according to the departmental dose-optimization program which includes automated exposure control, adjustment of the mA and/or kV according to patient size and/or use of iterative reconstruction technique. CONTRAST:  89m OMNIPAQUE IOHEXOL 350 MG/ML SOLN COMPARISON:  09/17/2021. FINDINGS: Cardiovascular: Heart is mildly enlarged and there is a moderate pericardial effusion measuring up to 1.9 cm. Multi-vessel coronary artery calcifications are noted. There is atherosclerotic calcification of the aorta without evidence of aneurysm. The pulmonary trunk is normal in caliber. No pulmonary artery filling defect is identified. Mediastinum/Nodes: No mediastinal, hilar, or axillary lymphadenopathy. The thyroid gland and trachea are within normal limits. The esophagus is  unremarkable. There is a moderate hiatal hernia. Lungs/Pleura: Mild hazy ground-glass attenuation in the lungs. No consolidation, effusion, or pneumothorax. Upper Abdomen: No acute abnormality. Musculoskeletal: Mild degenerative changes in the thoracic spine. No acute osseous abnormality. Review of the MIP images confirms the above findings. IMPRESSION: 1. No evidence of pulmonary embolism. 2. Mild cardiomegaly with moderate pericardial effusion. 3. Mild hazy ground-glass attenuation in the lungs, possible edema or pneumonitis. 4. Aortic atherosclerosis and coronary artery calcifications. Electronically Signed   By: LBrett FairyM.D.   On: 01/24/2022 01:02   DG Chest 2 View  Result Date: 01/23/2022 CLINICAL DATA:  Chest pain EXAM: CHEST - 2 VIEW COMPARISON:  Radiograph 11/08/2021 FINDINGS: Cardiomegaly and pulmonary vascular congestion. Low lung volumes. No focal consolidation, pleural effusion, or pneumothorax. No acute osseous abnormality. IMPRESSION: Cardiomegaly and pulmonary vascular congestion. Electronically Signed   By: TPlacido SouM.D.   On: 01/23/2022 15:12    EKG: Independently reviewed.  Sinus tachycardia with rate 129; nonspecific ST changes with no evidence of acute ischemia   Labs on Admission: I have personally reviewed the available labs and imaging studies at the time of the admission.  Pertinent labs:    Glucose 198 BNP 41.3 HS troponin 6, 5 Unremarkable CBC COVID/flu negative UA: trace LE   Assessment and Plan: Principal Problem:   Chest pain    Tachycardia -Patient with recent tachycardia, present for several months [facility] -She initially complained of generalized body aches and CP -No O2 requirement -No current symptoms -Negative BMP and troponin -Overall unremarkable work-up -She does still have mild tachycardia at rest (105) and increases to 130 or so with ambulation -She has been off Cardizem since September; will  resume -She otherwise does not appear to have acute issues that would warrant hospitalization compared to the benefits associated with ongoing mental health support -[facility]yr[facility]is [facility]  *Other medical problems appear to be stable at this time.  Her psychiatric and placement issues continue to be her most pressing considerations and she needs more intensive behavioral support than we can provide here.  Thank you for this interesting consult - TRH will sign off, as [facility]    Author: JKarmen Bongo MD 01/24/2022 7:13 AM  For on call review [email or web address]

## 2022-01-24 NOTE — ED Notes (Signed)
Out with RN to safe transport via w/c, report called to [facility]

## 2022-01-24 NOTE — ED Notes (Signed)
Pt began with HR 109 O2 97% When sitting up HR 113 O2 97% When walking  HR 121 O2 96%  DR Bebe Shaggy advised

## 2022-01-24 NOTE — ED Notes (Addendum)
Med requested from pharmacy. Pt eating breakfast. Up to b/r, steady gait.

## 2022-01-24 NOTE — ED Notes (Signed)
Patients blood sugar was 143

## 2022-01-24 NOTE — ED Notes (Signed)
Patient is sitting on the unit watching television with the other patients.

## 2022-01-24 NOTE — ED Notes (Signed)
Pt sleeping at present, no distress noted.  Monitoring for safety. 

## 2022-01-24 NOTE — ED Provider Notes (Signed)
Patient was evaluated by the hospitalist this morning and still the risk of admission with the benefits.  I assessed the patient does not feel like she is having any difficulty breathing.  She would like to go back to behavioral health to be evaluated.  Will have her return for any worsening symptoms.  Have her follow-up with family doctor in the office.   Melene Plan, DO 01/24/22 1020

## 2022-01-24 NOTE — ED Notes (Signed)
Pt arrives from hallway to hallway, alert, NAD, calm, interactive. Admitting MD arrives to [facility] now.

## 2022-01-24 NOTE — ED Provider Notes (Signed)
Behavioral Health Progress Note  Date and Time: 01/24/2022 12:40 PM Name: Teresa Murillo MRN:  [id number]  Subjective:  Teresa Murillo 61 y.o., female patient with a past psychiatric history of schizophrenia, aggressive behavior, and borderline intellectual disability who initially presented to [facility] on 11/23/2021 via [facility].  At that time patient had been dismissed from the group home due to multiple elopements.  Patient has remained at the [facility] while awaiting new placement.  Social work and DSS are involved.  Patient was complaining of bodyaches and chest pain on 01/23/2022 she was sent to the [facility] to be medically evaluated.  Patient was treated and medically cleared to return to [facility], 61 y.o., female patient seen face to face by this provider, consulted with Dr. Lovette Cliche; and chart reviewed on 01/24/22.   During evaluation Teresa Murillo is served laying in her bed in no acute distress.  She is currently denying any medical complaints including chest pain.  She is alert/oriented x 4, calm, cooperative, and attentive.  States, "yeah I got out of here for little bit and I am back now".  Reports she is feeling good and well rested.  She is denying any depression or anxiety and has a euthymic affect.  She has normal speech and behavior.  She is denying any SI/HI/AVH.  She is able to converse coherently with goal-directed thoughts, no distractibility or internal preoccupation.  Patient continues to remain psychiatrically cleared.  She remains boarding on the unit while social work and DSS is seeking placement.    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 On the Wechsler Adult Intelligence Scale-4, Teresa Murillo achieved a  full-scale IQ score of 73 and a percentile rank of 4 placing her in the borderline range of intellectual functioning.    Chronic obstructive pulmonary disease (COPD) (HCC)    Glaucoma    Hyperlipidemia    Hypertension    Iron deficiency    Schizoaffective disorder (HCC)    Type 2 diabetes mellitus (HCC)     Past Surgical History:  Procedure Laterality Date   TUBAL LIGATION     Family History:  Family History  Problem Relation Age of Onset   Breast cancer Maternal Grandmother    Family Psychiatric  History: schizophrenia, aggressive behavior, and borderline intellectual disability Social History:  Social History   Substance and Sexual Activity  Alcohol Use Yes     Social History   Substance and Sexual Activity  Drug Use Not Currently    Social History   Socioeconomic History   Marital status: Divorced    Spouse name: Not on file   Number of children: Not on file   Years of education: Not on file   Highest education level: Not on file  Occupational History   Not on file  Tobacco Use   Smoking status: Every Day    Packs/day: 1.00    Types: Cigars, Cigarettes   Smokeless tobacco: Current  Vaping Use   Vaping Use: Never used  Substance and Sexual Activity   Alcohol use: Yes   Drug use: Not Currently   Sexual activity: Not Currently    Birth control/protection: Surgical  Other Topics Concern   Not on file  Social History Narrative  Not on file   Social Determinants of Health   Financial Resource Strain: Not on file  Food Insecurity: Not on file  Transportation Needs: Not on file  Physical Activity: Not on file  Stress: Not on file  Social Connections: Not on file   SDOH:  SDOH Screenings   Depression (PHQ2-9): Low Risk  (11/23/2021)  Tobacco Use: High Risk (12/30/2021)   Additional Social History:                         Sleep: Good  Appetite:  Good  Current Medications:  No current facility-administered medications for this  encounter.   Current Outpatient Medications  Medication Sig Dispense Refill   Accu-Chek Softclix Lancets lancets Use as directed up to four times daily 100 each 0   apixaban (ELIQUIS) 5 MG TABS tablet Take 1 tablet (5 mg total) by mouth 2 (two) times daily. 60 tablet 0   ARIPiprazole (ABILIFY) 10 MG tablet Take 1 tablet (10 mg total) by mouth daily. 30 tablet 0   Blood Glucose Monitoring Suppl (ACCU-CHEK GUIDE) w/Device KIT Use as directed up to four times daily 1 kit 0   budesonide-formoterol (SYMBICORT) 160-4.5 MCG/ACT inhaler Inhale 2 puffs into the lungs in the morning and at bedtime.     Cholecalciferol (VITAMIN D3) 1.25 MG (50000 UT) CAPS Take 50,000 Units by mouth every Thursday.     cloZAPine (CLOZARIL) 25 MG tablet Take 3 tablets (75 mg total) by mouth at bedtime. 90 tablet 0   clozapine (CLOZARIL) 50 MG tablet Take 1 tablet (50 mg total) by mouth daily. 30 tablet 0   diltiazem (CARDIZEM CD) 240 MG 24 hr capsule Take 1 capsule (240 mg total) by mouth daily. (Patient not taking: Reported on 11/24/2021) 30 capsule 0   divalproex (DEPAKOTE ER) 500 MG 24 hr tablet Take 1 tablet (500 mg total) by mouth 2 (two) times daily. 60 tablet 0   glucose blood test strip Use as directed up to four times daily 50 each 0   haloperidol (HALDOL) 10 MG tablet Take 1 tablet (10 mg total) by mouth 3 (three) times daily as needed for agitation (and psychotic symptoms).     INGREZZA 40 MG capsule Take 1 capsule (40 mg total) by mouth in the morning. 30 capsule 0   insulin aspart (NOVOLOG) 100 UNIT/ML FlexPen Before each meal 3 times a day, 140-199 - 2 units, 200-250 - 4 units, 251-299 - 6 units,  300-349 - 8 units,  350 or above 10 units. Insulin PEN if approved, provide syringes and needles if needed.Please switch to any approved short acting Insulin if needed. 15 mL 0   insulin glargine (LANTUS) 100 UNIT/ML Solostar Pen Inject 12 Units into the skin daily. 15 mL 0   Insulin Pen Needle 32G X 4 MM MISC Use 4  times a day with insulin, 1 month supply. 100 each 0   latanoprost (XALATAN) 0.005 % ophthalmic solution Place 1 drop into both eyes at bedtime.     metFORMIN (GLUCOPHAGE) 500 MG tablet Take 500 mg by mouth 2 (two) times daily with a meal.     nicotine (NICODERM CQ - DOSED IN MG/24 HR) 7 mg/24hr patch Place 1 patch (7 mg total) onto the skin daily. 28 patch 0   PROAIR HFA 108 (90 Base) MCG/ACT inhaler Inhale 2 puffs into the lungs every 6 (six) hours as needed for wheezing or shortness of breath.  Labs  Lab Results:  Admission on 01/23/2022, Discharged on 01/24/2022  Component Date Value Ref Range Status   Sodium 01/23/2022 140  135 - 145 mmol/L Final   Potassium 01/23/2022 4.4  3.5 - 5.1 mmol/L Final   Chloride 01/23/2022 101  98 - 111 mmol/L Final   CO2 01/23/2022 25  22 - 32 mmol/L Final   Glucose, Bld 01/23/2022 198 (H)  70 - 99 mg/dL Final   Glucose reference range applies only to samples taken after fasting for at least 8 hours.   BUN 01/23/2022 13  6 - 20 mg/dL Final   Creatinine, Ser 01/23/2022 0.62  0.44 - 1.00 mg/dL Final   Calcium 01/23/2022 9.3  8.9 - 10.3 mg/dL Final   GFR, Estimated 01/23/2022 >60  >60 mL/min Final   Comment: (NOTE) Calculated using the CKD-EPI Creatinine Equation (2021)    Anion gap 01/23/2022 14  5 - 15 Final   Performed at [facility], [place] [address] [place], [place] [address]   WBC 01/23/2022 5.8  4.0 - 10.5 K/uL Final   RBC 01/23/2022 4.63  3.87 - 5.11 MIL/uL Final   Hemoglobin 01/23/2022 11.7 (L)  12.0 - 15.0 g/dL Final   HCT 01/23/2022 37.4  36.0 - 46.0 % Final   MCV 01/23/2022 80.8  80.0 - 100.0 fL Final   MCH 01/23/2022 25.3 (L)  26.0 - 34.0 pg Final   MCHC 01/23/2022 31.3  30.0 - 36.0 g/dL Final   RDW 01/23/2022 17.9 (H)  11.5 - 15.5 % Final   Platelets 01/23/2022 333  150 - 400 K/uL Final   nRBC 01/23/2022 0.0  0.0 - 0.2 % Final   Performed at [facility] [address], [place], [place] [address]   Troponin I (High  Sensitivity) 01/23/2022 6  <18 ng/L Final   Comment: (NOTE) Elevated high sensitivity troponin I (hsTnI) values and significant  changes across serial measurements may suggest ACS but many other  chronic and acute conditions are known to elevate hsTnI results.  Refer to the "Links" section for chest pain algorithms and additional  guidance. Performed at [facility], [place] [address] [place], [place] [address]    Troponin I (High Sensitivity) 01/23/2022 5  <18 ng/L Final   Comment: (NOTE) Elevated high sensitivity troponin I (hsTnI) values and significant  changes across serial measurements may suggest ACS but many other  chronic and acute conditions are known to elevate hsTnI results.  Refer to the "Links" section for chest pain algorithms and additional  guidance. Performed at [facility], [place] [address], [place], [place] [address]    SARS Coronavirus 2 by RT PCR 01/23/2022 NEGATIVE  NEGATIVE Final   Comment: (NOTE) SARS-CoV-2 target nucleic acids are NOT DETECTED.  The SARS-CoV-2 RNA is generally detectable in upper and lower respiratory specimens during the acute phase of infection. The lowest concentration of SARS-CoV-2 viral copies this assay can detect is 250 copies / mL. A negative result does not preclude SARS-CoV-2 infection and should not be used as the sole basis for treatment or other patient management decisions.  A negative result may occur with improper specimen collection / handling, submission of specimen other than nasopharyngeal swab, presence of viral mutation(s) within the areas targeted by this assay, and inadequate number of viral copies (<250 copies / mL). A negative result must be combined with clinical observations, patient history, and epidemiological information.  Fact Sheet for Patients:   https://www.patel.info/  Fact  Sheet for Healthcare Providers: https://hall.com/  This test is not yet  approved or                           cleared by the [place] and has been authorized for detection and/or diagnosis of SARS-CoV-2 by FDA under an Emergency Use Authorization (EUA).  This EUA will remain in effect (meaning this test can be used) for the duration of the COVID-19 declaration under Section 564(b)(1) of the Act, 21 U.S.C. section 360bbb-3(b)(1), unless the authorization is terminated or revoked sooner.  Performed at [facility], [place] [address], [place], [place] [address]    B Natriuretic Peptide 01/23/2022 41.3  0.0 - 100.0 pg/mL Final   Performed at [facility] [address], [place], [place] [address]   Group A Strep by PCR 01/23/2022 NOT DETECTED  NOT DETECTED Final   Performed at [facility], [place] [address] [place], [place] [address]   Color, Urine 01/23/2022 YELLOW  YELLOW Final   APPearance 01/23/2022 CLEAR  CLEAR Final   Specific Gravity, Urine 01/23/2022 1.018  1.005 - 1.030 Final   pH 01/23/2022 7.0  5.0 - 8.0 Final   Glucose, UA 01/23/2022 NEGATIVE  NEGATIVE mg/dL Final   Hgb urine dipstick 01/23/2022 NEGATIVE  NEGATIVE Final   Bilirubin Urine 01/23/2022 NEGATIVE  NEGATIVE Final   Ketones, ur 01/23/2022 NEGATIVE  NEGATIVE mg/dL Final   Protein, ur 01/23/2022 NEGATIVE  NEGATIVE mg/dL Final   Nitrite 01/23/2022 NEGATIVE  NEGATIVE Final   Leukocytes,Ua 01/23/2022 TRACE (A)  NEGATIVE Final   RBC / HPF 01/23/2022 0-5  0 - 5 RBC/hpf Final   WBC, UA 01/23/2022 0-5  0 - 5 WBC/hpf Final   Bacteria, UA 01/23/2022 NONE SEEN  NONE SEEN Final   Squamous Epithelial / LPF 01/23/2022 0-5  0 - 5 Final   Mucus 01/23/2022 PRESENT   Final   Performed at [facility], [place] [address] [place], [place] [address]   SARS Coronavirus 2 by RT PCR 01/23/2022 NEGATIVE  NEGATIVE Final   Comment: (NOTE) SARS-CoV-2 target nucleic acids are NOT DETECTED.  The SARS-CoV-2 RNA is generally detectable in upper respiratory specimens during the acute phase  of infection. The lowest concentration of SARS-CoV-2 viral copies this assay can detect is 138 copies/mL. A negative result does not preclude SARS-Cov-2 infection and should not be used as the sole basis for treatment or other patient management decisions. A negative result may occur with  improper specimen collection/handling, submission of specimen other than nasopharyngeal swab, presence of viral mutation(s) within the areas targeted by this assay, and inadequate number of viral copies(<138 copies/mL). A negative result must be combined with clinical observations, patient history, and epidemiological information. The expected result is Negative.  Fact Sheet for Patients:  EntrepreneurPulse.com.au  Fact Sheet for Healthcare Providers:  IncredibleEmployment.be  This test is no                          t yet approved or cleared by the [place] FDA and  has been authorized for detection and/or diagnosis of SARS-CoV-2 by FDA under an Emergency Use Authorization (EUA). This EUA will remain  in effect (meaning this test can be used) for the duration of the COVID-19 declaration under Section 564(b)(1) of the Act, 21 U.S.C.section 360bbb-3(b)(1), unless the authorization is terminated  or revoked sooner.  Influenza A by PCR 01/23/2022 NEGATIVE  NEGATIVE Final   Influenza B by PCR 01/23/2022 NEGATIVE  NEGATIVE Final   Comment: (NOTE) The Xpert Xpress SARS-CoV-2/FLU/RSV plus assay is intended as an aid in the diagnosis of influenza from Nasopharyngeal swab specimens and should not be used as a sole basis for treatment. Nasal washings and aspirates are unacceptable for Xpert Xpress SARS-CoV-2/FLU/RSV testing.  Fact Sheet for Patients: EntrepreneurPulse.com.au  Fact Sheet for Healthcare Providers: IncredibleEmployment.be  This test is not yet approved or cleared by the [place] FDA and has been  authorized for detection and/or diagnosis of SARS-CoV-2 by FDA under an Emergency Use Authorization (EUA). This EUA will remain in effect (meaning this test can be used) for the duration of the COVID-19 declaration under Section 564(b)(1) of the Act, 21 U.S.C. section 360bbb-3(b)(1), unless the authorization is terminated or revoked.  Performed at [facility], [place] [address] [place], [place] [address]    WBC 01/24/2022 6.1  4.0 - 10.5 K/uL Final   RBC 01/24/2022 4.60  3.87 - 5.11 MIL/uL Final   Hemoglobin 01/24/2022 11.7 (L)  12.0 - 15.0 g/dL Final   HCT 01/24/2022 37.0  36.0 - 46.0 % Final   MCV 01/24/2022 80.4  80.0 - 100.0 fL Final   MCH 01/24/2022 25.4 (L)  26.0 - 34.0 pg Final   MCHC 01/24/2022 31.6  30.0 - 36.0 g/dL Final   RDW 01/24/2022 17.6 (H)  11.5 - 15.5 % Final   Platelets 01/24/2022 334  150 - 400 K/uL Final   nRBC 01/24/2022 0.0  0.0 - 0.2 % Final   Neutrophils Relative % 01/24/2022 53  % Final   Neutro Abs 01/24/2022 3.3  1.7 - 7.7 K/uL Final   Lymphocytes Relative 01/24/2022 33  % Final   Lymphs Abs 01/24/2022 2.0  0.7 - 4.0 K/uL Final   Monocytes Relative 01/24/2022 11  % Final   Monocytes Absolute 01/24/2022 0.7  0.1 - 1.0 K/uL Final   Eosinophils Relative 01/24/2022 2  % Final   Eosinophils Absolute 01/24/2022 0.1  0.0 - 0.5 K/uL Final   Basophils Relative 01/24/2022 1  % Final   Basophils Absolute 01/24/2022 0.1  0.0 - 0.1 K/uL Final   Immature Granulocytes 01/24/2022 0  % Final   Abs Immature Granulocytes 01/24/2022 0.02  0.00 - 0.07 K/uL Final   Performed at [facility], [place] [address] [place], [place] [address]   Glucose-Capillary 01/24/2022 110 (H)  70 - 99 mg/dL Final   Glucose reference range applies only to samples taken after fasting for at least 8 hours.  No results displayed because visit has over 200 results.    Admission on 11/22/2021, Discharged on 11/22/2021  Component Date Value Ref Range Status   Glucose-Capillary 11/22/2021  169 (H)  70 - 99 mg/dL Final   Glucose reference range applies only to samples taken after fasting for at least 8 hours.  No results displayed because visit has over 200 results.    Admission on 10/04/2021, Discharged on 10/22/2021  Component Date Value Ref Range Status   SARS Coronavirus 2 by RT PCR 10/04/2021 NEGATIVE  NEGATIVE Final   Comment: (NOTE) SARS-CoV-2 target nucleic acids are NOT DETECTED.  The SARS-CoV-2 RNA is generally detectable in upper respiratory specimens during the acute phase of infection. The lowest concentration of SARS-CoV-2 viral copies this assay can detect is 138 copies/mL. A negative result does not preclude SARS-Cov-2 infection and should not be used as the  sole basis for treatment or other patient management decisions. A negative result may occur with  improper specimen collection/handling, submission of specimen other than nasopharyngeal swab, presence of viral mutation(s) within the areas targeted by this assay, and inadequate number of viral copies(<138 copies/mL). A negative result must be combined with clinical observations, patient history, and epidemiological information. The expected result is Negative.  Fact Sheet for Patients:  EntrepreneurPulse.com.au  Fact Sheet for Healthcare Providers:  IncredibleEmployment.be  This test is no                          t yet approved or cleared by the [place] FDA and  has been authorized for detection and/or diagnosis of SARS-CoV-2 by FDA under an Emergency Use Authorization (EUA). This EUA will remain  in effect (meaning this test can be used) for the duration of the COVID-19 declaration under Section 564(b)(1) of the Act, 21 U.S.C.section 360bbb-3(b)(1), unless the authorization is terminated  or revoked sooner.       Influenza A by PCR 10/04/2021 NEGATIVE  NEGATIVE Final   Influenza B by PCR 10/04/2021 NEGATIVE  NEGATIVE Final   Comment: (NOTE) The  Xpert Xpress SARS-CoV-2/FLU/RSV plus assay is intended as an aid in the diagnosis of influenza from Nasopharyngeal swab specimens and should not be used as a sole basis for treatment. Nasal washings and aspirates are unacceptable for Xpert Xpress SARS-CoV-2/FLU/RSV testing.  Fact Sheet for Patients: EntrepreneurPulse.com.au  Fact Sheet for Healthcare Providers: IncredibleEmployment.be  This test is not yet approved or cleared by the [place] FDA and has been authorized for detection and/or diagnosis of SARS-CoV-2 by FDA under an Emergency Use Authorization (EUA). This EUA will remain in effect (meaning this test can be used) for the duration of the COVID-19 declaration under Section 564(b)(1) of the Act, 21 U.S.C. section 360bbb-3(b)(1), unless the authorization is terminated or revoked.  Performed at [facility], [place] [address], [place], [place] [address]    WBC 10/04/2021 8.4  4.0 - 10.5 K/uL Final   RBC 10/04/2021 4.42  3.87 - 5.11 MIL/uL Final   Hemoglobin 10/04/2021 11.6 (L)  12.0 - 15.0 g/dL Final   HCT 10/04/2021 35.7 (L)  36.0 - 46.0 % Final   MCV 10/04/2021 80.8  80.0 - 100.0 fL Final   MCH 10/04/2021 26.2  26.0 - 34.0 pg Final   MCHC 10/04/2021 32.5  30.0 - 36.0 g/dL Final   RDW 10/04/2021 16.0 (H)  11.5 - 15.5 % Final   Platelets 10/04/2021 372  150 - 400 K/uL Final   nRBC 10/04/2021 0.0  0.0 - 0.2 % Final   Neutrophils Relative % 10/04/2021 68  % Final   Neutro Abs 10/04/2021 5.7  1.7 - 7.7 K/uL Final   Lymphocytes Relative 10/04/2021 22  % Final   Lymphs Abs 10/04/2021 1.8  0.7 - 4.0 K/uL Final   Monocytes Relative 10/04/2021 8  % Final   Monocytes Absolute 10/04/2021 0.7  0.1 - 1.0 K/uL Final   Eosinophils Relative 10/04/2021 1  % Final   Eosinophils Absolute 10/04/2021 0.1  0.0 - 0.5 K/uL Final   Basophils Relative 10/04/2021 1  % Final   Basophils Absolute 10/04/2021 0.1  0.0 - 0.1 K/uL Final   Immature  Granulocytes 10/04/2021 0  % Final   Abs Immature Granulocytes 10/04/2021 0.03  0.00 - 0.07 K/uL Final   Performed at [facility], [place] [address], [place], [place]  [address]   Sodium 10/04/2021 136  135 - 145 mmol/L Final   Potassium 10/04/2021 4.2  3.5 - 5.1 mmol/L Final   Chloride 10/04/2021 104  98 - 111 mmol/L Final   CO2 10/04/2021 25  22 - 32 mmol/L Final   Glucose, Bld 10/04/2021 100 (H)  70 - 99 mg/dL Final   Glucose reference range applies only to samples taken after fasting for at least 8 hours.   BUN 10/04/2021 9  6 - 20 mg/dL Final   Creatinine, Ser 10/04/2021 0.52  0.44 - 1.00 mg/dL Final   Calcium 10/04/2021 9.0  8.9 - 10.3 mg/dL Final   Total Protein 10/04/2021 7.0  6.5 - 8.1 g/dL Final   Albumin 10/04/2021 3.2 (L)  3.5 - 5.0 g/dL Final   AST 10/04/2021 13 (L)  15 - 41 U/L Final   ALT 10/04/2021 10  0 - 44 U/L Final   Alkaline Phosphatase 10/04/2021 61  38 - 126 U/L Final   Total Bilirubin 10/04/2021 0.3  0.3 - 1.2 mg/dL Final   GFR, Estimated 10/04/2021 >60  >60 mL/min Final   Comment: (NOTE) Calculated using the CKD-EPI Creatinine Equation (2021)    Anion gap 10/04/2021 7  5 - 15 Final   Performed at [facility] [address], [place], [place] [address]   Hgb A1c MFr Bld 10/04/2021 7.0 (H)  4.8 - 5.6 % Final   Comment: (NOTE) Pre diabetes:          5.7%-6.4%  Diabetes:              >6.4%  Glycemic control for   <7.0% adults with diabetes    Mean Plasma Glucose 10/04/2021 154.2  mg/dL Final   Performed at [facility], [place] [address] [place], [place] [address]   Cholesterol 10/04/2021 178  0 - 200 mg/dL Final   Triglycerides 10/04/2021 155 (H)  <150 mg/dL Final   HDL 10/04/2021 52  >40 mg/dL Final   Total CHOL/HDL Ratio 10/04/2021 3.4  RATIO Final   VLDL 10/04/2021 31  0 - 40 mg/dL Final   LDL Cholesterol 10/04/2021 95  0 - 99 mg/dL Final   Comment:        Total Cholesterol/HDL:CHD Risk Coronary Heart Disease Risk Table                      Men   Women  1/2 Average Risk   3.4   3.3  Average Risk       5.0   4.4  2 X Average Risk   9.6   7.1  3 X Average Risk  23.4   11.0        Use the calculated Patient Ratio above and the CHD Risk Table to determine the patient's CHD Risk.        ATP III CLASSIFICATION (LDL):  <100     mg/dL   Optimal  100-129  mg/dL   Near or Above                    Optimal  130-159  mg/dL   Borderline  160-189  mg/dL   High  >190     mg/dL   Very High Performed at [facility] [address], [place],  [address]    POC Amphetamine UR 10/04/2021 None Detected  NONE DETECTED (Cut Off Level 1000 ng/mL) Final   POC Secobarbital (BAR) 10/04/2021 None Detected  NONE DETECTED (Cut Off Level 300 ng/mL) Final   POC Buprenorphine (BUP) 10/04/2021 None Detected  NONE DETECTED (Cut Off Level 10 ng/mL) Final   POC Oxazepam (BZO) 10/04/2021 None Detected  NONE DETECTED (Cut Off Level 300 ng/mL) Final   POC Cocaine UR 10/04/2021 None Detected  NONE DETECTED (Cut Off Level 300 ng/mL) Final   POC Methamphetamine UR 10/04/2021 None Detected  NONE DETECTED (Cut Off Level 1000 ng/mL) Final   POC Morphine 10/04/2021 None Detected  NONE DETECTED (Cut Off Level 300 ng/mL) Final   POC Methadone UR 10/04/2021 None Detected  NONE DETECTED (Cut Off Level 300 ng/mL) Final   POC Oxycodone UR 10/04/2021 Positive (A)  NONE DETECTED (Cut Off Level 100 ng/mL) Final   POC Marijuana UR 10/04/2021 None Detected  NONE DETECTED (Cut Off Level 50 ng/mL) Final   SARSCOV2ONAVIRUS 2 AG 10/04/2021 NEGATIVE  NEGATIVE Final   Comment: (NOTE) SARS-CoV-2 antigen NOT DETECTED.   Negative results are presumptive.  Negative results do not preclude SARS-CoV-2 infection and should not be used as the sole basis for treatment or other patient management decisions, including infection  control decisions, particularly in the presence of clinical signs and  symptoms consistent with COVID-19, or in those who have been in contact  with the virus.  Negative results must be combined with clinical observations, patient history, and epidemiological information. The expected result is Negative.  Fact Sheet for Patients: HandmadeRecipes.com.cy  Fact Sheet for Healthcare Providers: FuneralLife.at  This test is not yet approved or cleared by the [place] FDA and  has been authorized for detection and/or diagnosis of SARS-CoV-2 by FDA under an Emergency Use Authorization (EUA).  This EUA will remain in effect (meaning this test can be used) for the duration of  the COV                          ID-19 declaration under Section 564(b)(1) of the Act, 21 U.S.C. section 360bbb-3(b)(1), unless the authorization is terminated or revoked sooner.     Valproic Acid Lvl 10/04/2021 51  50.0 - 100.0 ug/mL Final   Performed at [facility] [address], [place], [place] [address]   Valproic Acid Lvl 10/08/2021 57  50.0 - 100.0 ug/mL Final   Performed at [facility] [address], [place], [place] [address]   Glucose-Capillary 10/09/2021 123 (H)  70 - 99 mg/dL Final   Glucose reference range applies only to samples taken after fasting for at least 8 hours.  Admission on 09/09/2021, Discharged on 09/10/2021  Component Date Value Ref Range Status   Sodium 09/09/2021 134 (L)  135 - 145 mmol/L Final   Potassium 09/09/2021 4.3  3.5 - 5.1 mmol/L Final   Chloride 09/09/2021 99  98 - 111 mmol/L Final   CO2 09/09/2021 25  22 - 32 mmol/L Final   Glucose, Bld 09/09/2021 107 (H)  70 - 99 mg/dL Final   Glucose reference range applies only to samples taken after fasting for at least 8 hours.   BUN 09/09/2021 12  6 - 20 mg/dL Final   Creatinine, Ser 09/09/2021 0.74  0.44 - 1.00 mg/dL Final   Calcium 09/09/2021 9.0  8.9 - 10.3 mg/dL Final   Total Protein 09/09/2021 7.4  6.5 - 8.1 g/dL Final   Albumin 09/09/2021 3.1 (L)  3.5 - 5.0 g/dL Final   AST 09/09/2021 13 (L)  15 - 41 U/L  Final  ALT 09/09/2021 13  0 - 44 U/L Final   Alkaline Phosphatase 09/09/2021 66  38 - 126 U/L Final   Total Bilirubin 09/09/2021 0.2 (L)  0.3 - 1.2 mg/dL Final   GFR, Estimated 09/09/2021 >60  >60 mL/min Final   Comment: (NOTE) Calculated using the CKD-EPI Creatinine Equation (2021)    Anion gap 09/09/2021 10  5 - 15 Final   Performed at [facility], [place] [address] [place], [place] [address]   WBC 09/09/2021 8.5  4.0 - 10.5 K/uL Final   RBC 09/09/2021 4.53  3.87 - 5.11 MIL/uL Final   Hemoglobin 09/09/2021 11.8 (L)  12.0 - 15.0 g/dL Final   HCT 09/09/2021 38.0  36.0 - 46.0 % Final   MCV 09/09/2021 83.9  80.0 - 100.0 fL Final   MCH 09/09/2021 26.0  26.0 - 34.0 pg Final   MCHC 09/09/2021 31.1  30.0 - 36.0 g/dL Final   RDW 09/09/2021 17.8 (H)  11.5 - 15.5 % Final   Platelets 09/09/2021 442 (H)  150 - 400 K/uL Final   nRBC 09/09/2021 0.0  0.0 - 0.2 % Final   Neutrophils Relative % 09/09/2021 70  % Final   Neutro Abs 09/09/2021 5.9  1.7 - 7.7 K/uL Final   Lymphocytes Relative 09/09/2021 20  % Final   Lymphs Abs 09/09/2021 1.7  0.7 - 4.0 K/uL Final   Monocytes Relative 09/09/2021 8  % Final   Monocytes Absolute 09/09/2021 0.7  0.1 - 1.0 K/uL Final   Eosinophils Relative 09/09/2021 1  % Final   Eosinophils Absolute 09/09/2021 0.1  0.0 - 0.5 K/uL Final   Basophils Relative 09/09/2021 1  % Final   Basophils Absolute 09/09/2021 0.1  0.0 - 0.1 K/uL Final   Immature Granulocytes 09/09/2021 0  % Final   Abs Immature Granulocytes 09/09/2021 0.03  0.00 - 0.07 K/uL Final   Performed at [facility], [place] [address] [place], [place] [address]   Ammonia 09/09/2021 37 (H)  9 - 35 umol/L Final   Comment: HEMOLYSIS AT THIS LEVEL MAY AFFECT RESULT Performed at [facility], [place] [address] [place], [place] [address]    Opiates 09/09/2021 NONE DETECTED  NONE DETECTED Final   Cocaine 09/09/2021 NONE DETECTED  NONE DETECTED Final   Benzodiazepines 09/09/2021 NONE DETECTED  NONE  DETECTED Final   Amphetamines 09/09/2021 NONE DETECTED  NONE DETECTED Final   Tetrahydrocannabinol 09/09/2021 NONE DETECTED  NONE DETECTED Final   Barbiturates 09/09/2021 NONE DETECTED  NONE DETECTED Final   Comment: (NOTE) DRUG SCREEN FOR MEDICAL PURPOSES ONLY.  IF CONFIRMATION IS NEEDED FOR ANY PURPOSE, NOTIFY LAB WITHIN 5 DAYS.  LOWEST DETECTABLE LIMITS FOR URINE DRUG SCREEN Drug Class                     Cutoff (ng/mL) Amphetamine and metabolites    1000 Barbiturate and metabolites    200 Benzodiazepine                 102 Tricyclics and metabolites     300 Opiates and metabolites        300 Cocaine and metabolites        300 THC                            50 Performed at [facility], [place] [address] [place], [place] [address]    Alcohol, Ethyl (B)  09/09/2021 <10  <10 mg/dL Final   Comment: (NOTE) Lowest detectable limit for serum alcohol is 10 mg/dL.  For medical purposes only. Performed at [facility], [place] [address], [place], [place] [address]    Lipase 09/09/2021 33  11 - 51 U/L Final   Performed at [facility] [address] [place], [place] [address]   Color, Urine 09/09/2021 COLORLESS (A)  YELLOW Final   APPearance 09/09/2021 CLEAR  CLEAR Final   Specific Gravity, Urine 09/09/2021 1.002 (L)  1.005 - 1.030 Final   pH 09/09/2021 6.0  5.0 - 8.0 Final   Glucose, UA 09/09/2021 NEGATIVE  NEGATIVE mg/dL Final   Hgb urine dipstick 09/09/2021 NEGATIVE  NEGATIVE Final   Bilirubin Urine 09/09/2021 NEGATIVE  NEGATIVE Final   Ketones, ur 09/09/2021 NEGATIVE  NEGATIVE mg/dL Final   Protein, ur 09/09/2021 NEGATIVE  NEGATIVE mg/dL Final   Nitrite 09/09/2021 NEGATIVE  NEGATIVE Final   Leukocytes,Ua 09/09/2021 NEGATIVE  NEGATIVE Final   Performed at [facility] [address], [place], [place] [address]   Specimen Description 09/09/2021 URINE, CLEAN CATCH   Final   Special Requests 09/09/2021 NONE   Final   Culture 09/09/2021  (A)   Final                    Value:<10,000 COLONIES/mL INSIGNIFICANT GROWTH Performed at [facility], [place] [address], [place], [place] [address]    Report Status 09/09/2021 09/10/2021 FINAL   Final   D-Dimer, Quant 09/09/2021 0.92 (H)  0.00 - 0.50 ug/mL-FEU Final   Comment: (NOTE) At the manufacturer cut-off value of 0.5 g/mL FEU, this assay has a negative predictive value of 95-100%.This assay is intended for use in conjunction with a clinical pretest probability (PTP) assessment model to exclude pulmonary embolism (PE) and deep venous thrombosis (DVT) in outpatients suspected of PE or DVT. Results should be correlated with clinical presentation. Performed at [facility], [place] [address] [place], [place] [address]    Troponin I (High Sensitivity) 09/09/2021 5  <18 ng/L Final   Comment: (NOTE) Elevated high sensitivity troponin I (hsTnI) values and significant  changes across serial measurements may suggest ACS but many other  chronic and acute conditions are known to elevate hsTnI results.  Refer to the "Links" section for chest pain algorithms and additional  guidance. Performed at [facility], [place] [address], [place], [place] [address]    BP 09/10/2021 135/83  mmHg Final   S' Lateral 09/10/2021 2.30  cm Final   Area-P 1/2 09/10/2021 5.58  cm2 Final    Blood Alcohol level:  Lab Results  Component Value Date   ETH <10 11/23/2021   ETH <10 54/27/0623    Metabolic Disorder Labs: Lab Results  Component Value Date   HGBA1C 6.7 (H) 12/28/2021   MPG 145.59 12/28/2021   MPG 154.2 10/04/2021   Lab Results  Component Value Date   PROLACTIN 3.8 (L) 11/23/2021   Lab Results  Component Value Date   CHOL 171 11/23/2021   TRIG 125 11/23/2021   HDL 65 11/23/2021   CHOLHDL 2.6 11/23/2021   VLDL 25 11/23/2021   LDLCALC 81 11/23/2021   LDLCALC 95 10/04/2021    Therapeutic Lab Levels: No results found for: "LITHIUM" Lab Results  Component Value Date   VALPROATE 33  (L) 01/18/2022   VALPROATE 33 (L) 12/28/2021   No results found for: "CBMZ"  Physical Findings  [place] [facility] ED from 11/23/2021 in [facility]  PHQ-2 Total Score 2  PHQ-9 Total Score 3      Flowsheet Row ED from 01/23/2022 in [place] ED from 11/23/2021 in [facility] ED from 11/22/2021 in [place] DEPT  C-SSRS RISK CATEGORY No Risk No Risk No Risk        Musculoskeletal  Strength & Muscle Tone: within normal limits Gait & Station: normal Patient leans: N/A  Psychiatric Specialty Exam  Presentation  General Appearance:  Appropriate for Environment  Eye Contact: Fair  Speech: Clear and Coherent; Normal Rate  Speech Volume: Normal  Handedness: Right   Mood and Affect  Mood: Euthymic  Affect: Congruent   Thought Process  Thought Processes: Coherent  Descriptions of Associations:Intact  Orientation:Full (Time, Place and Person)  Thought Content:Logical  Diagnosis of Schizophrenia or Schizoaffective disorder in past: Yes  Duration of Psychotic Symptoms: No data recorded  Hallucinations:Hallucinations: None  Ideas of Reference:None  Suicidal Thoughts:Suicidal Thoughts: No  Homicidal Thoughts:Homicidal Thoughts: No   Sensorium  Memory: Immediate Fair; Recent Fair; Remote Fair  Judgment: Intact  Insight: Present   Executive Functions  Concentration: Fair  Attention Span: Fair  Recall: [employer] of Knowledge: Fair  Language: Fair   Psychomotor Activity  Psychomotor Activity: Psychomotor Activity: Normal   Assets  Assets: Communication Skills; Desire for Improvement; Financial Resources/Insurance; Leisure Time; Physical Health; Resilience   Sleep  Sleep: Sleep: Fair Number of Hours of Sleep: 8   Nutritional Assessment (For OBS and FBC admissions only) Has the patient had a  weight loss or gain of 10 pounds or more in the last 3 months?: No Has the patient had a decrease in food intake/or appetite?: No Does the patient have eating habits or behaviors that may be indicators of an eating disorder including binging or inducing vomiting?: No Has the patient recently lost weight without trying?: 0    Physical Exam  Physical Exam Vitals and nursing note reviewed.  Constitutional:      General: She is not in acute distress.    Appearance: Normal appearance. She is not ill-appearing.  HENT:     Head: Normocephalic.  Eyes:     General:        Right eye: No discharge.        Left eye: No discharge.     Conjunctiva/sclera: Conjunctivae normal.  Cardiovascular:     Rate and Rhythm: Normal rate.  Pulmonary:     Effort: Pulmonary effort is normal.  Musculoskeletal:        General: Normal range of motion.     Cervical back: Normal range of motion.  Skin:    Coloration: Skin is not jaundiced or pale.  Neurological:     Mental Status: She is alert and oriented to person, place, and time.  Psychiatric:        Attention and Perception: Attention and perception normal.        Mood and Affect: Mood and affect normal.        Speech: Speech normal.        Behavior: Behavior normal. Behavior is cooperative.        Thought Content: Thought content normal.        Cognition and Memory: Cognition normal.        Judgment: Judgment normal.    Review of Systems  Constitutional: Negative.   HENT: Negative.  Eyes: Negative.   Respiratory: Negative.    Cardiovascular: Negative.   Musculoskeletal: Negative.   Skin: Negative.   Neurological: Negative.   Psychiatric/Behavioral: Negative.     There were no vitals taken for this visit. There is no height or weight on file to calculate BMI.  Treatment Plan Summary:  Patient remains psychiatrically cleared. Social work and DSS actively seeking placement.  Meds ordered this encounter  Medications   apixaban (ELIQUIS)  tablet 5 mg bid   Vitamin D (Ergocalciferol) (DRISDOL) 1.25 MG (50000 UNIT) capsule 50,000 Units q7 days   cloZAPine (CLOZARIL) tablet 75 mg qhs   cloZAPine (CLOZARIL) tablet 50 mg qd   divalproex (DEPAKOTE ER) 24 hr tablet 500 mg bid   latanoprost (XALATAN) 0.005 % ophthalmic solution 1 drop qhs   albuterol (VENTOLIN HFA) 108 (90 Base) MCG/ACT inhaler 2 puff prn   haloperidol (HALDOL) tablet 10 mg Q8 hours PRN   nicotine (NICODERM CQ - dosed in mg/24 hr) patch 7 mg daily   fluticasone furoate-vilanterol (BREO ELLIPTA) 200-25 MCG/ACT 1 puff daily   diltiazem (CARDIZEM CD) 24 hr capsule 240 mg daily   ARIPiprazole (ABILIFY) tablet 10 mg daily   valbenazine (INGREZZA) capsule 40 mg bid   insulin aspart (novoLOG) injection 0-9 Units    Order Specific Question:   Correction coverage:    Answer:   Sensitive (thin, NPO, renal)    Order Specific Question:   CBG < 70:    Answer:   Implement Hypoglycemia Standing Orders and refer to Hypoglycemia Standing Orders sidebar report    Order Specific Question:   CBG 70 - 120:    Answer:   0 units    Order Specific Question:   CBG 121 - 150:    Answer:   1 unit    Order Specific Question:   CBG 151 - 200:    Answer:   2 units    Order Specific Question:   CBG 201 - 250:    Answer:   3 units    Order Specific Question:   CBG 251 - 300:    Answer:   5 units    Order Specific Question:   CBG 301 - 350:    Answer:   7 units    Order Specific Question:   CBG 351 - 400    Answer:   9 units    Order Specific Question:   CBG > 400    Answer:   call MD and obtain STAT lab verification   insulin glargine-yfgn (SEMGLEE) injection 8 Units qd   metFORMIN (GLUCOPHAGE-XR) 24 hr tablet 1,000 mg bid   acetaminophen (TYLENOL) tablet 650 mg   alum & mag hydroxide-simeth (MAALOX/MYLANTA) 200-200-20 MG/5ML suspension 30 mL   magnesium hydroxide (MILK OF MAGNESIA) suspension 30 mL   hydrOXYzine (ATARAX) tablet 25 mg   traZODone (DESYREL) tablet 50 mg     Lab  Orders          CBC with Differential  01/26/2022      CBC with Differential   01/28/2022      CBC with Differential    01/30/2022  Revonda Humphrey, NP 01/24/2022 12:40 PM

## 2022-01-24 NOTE — ED Notes (Signed)
Patient was readmitted to the [facility]. Patient denies Si/HI and AVH. Patient's skin assessment was done prior to patient being brought back on the unit. Patient reports she is still having slight chest pain. Patient's provider will be notified. Patient is being monitored for safety.

## 2022-01-25 ENCOUNTER — Encounter (HOSPITAL_COMMUNITY): Payer: Self-pay | Admitting: Registered Nurse

## 2022-01-25 DIAGNOSIS — F209 Schizophrenia, unspecified: Secondary | ICD-10-CM | POA: Diagnosis not present

## 2022-01-25 LAB — GLUCOSE, CAPILLARY
Glucose-Capillary: 122 mg/dL — ABNORMAL HIGH (ref 70–99)
Glucose-Capillary: 190 mg/dL — ABNORMAL HIGH (ref 70–99)
Glucose-Capillary: 163 mg/dL — ABNORMAL HIGH (ref 70–99)

## 2022-01-25 NOTE — ED Notes (Signed)
Patient resting quietly in bed with eyes closed. Respirations equal and unlabored, skin warm and dry, NAD. Routine safety checks conducted according to facility protocol. Will continue to monitor for safety.  

## 2022-01-25 NOTE — ED Notes (Signed)
Pt sleeping at present, no distress noted.  Monitoring for safety. 

## 2022-01-25 NOTE — ED Notes (Signed)
Patient resting quietly in bed watching TV.  Respirations equal and unlabored, skin warm and dry, NAD. Routine safety checks conducted according to facility protocol. Will continue to monitor for safety.   

## 2022-01-25 NOTE — Progress Notes (Signed)
LCSW Progress Note  1533 - LCSW received a call from Denton Lank, [facility], regarding update on placement.  At this time, she is awaiting a signature page for the PCP by the [facility], and to consult with her supervisor if the PCP will still be current enough to submit with the [facility].  That is all for now.   Hansel Starling, MSW, LCSW [facility] [phone] phone [phone] fax

## 2022-01-25 NOTE — ED Notes (Signed)
Pt A&O x 4, no distress noted, calm & cooperative, watching TV at present.  Monitoring for safety. 

## 2022-01-25 NOTE — ED Provider Notes (Signed)
Behavioral Health Progress Note  Date and Time: 01/25/2022 9:54 AM Name: Teresa Murillo MRN:  [id number]  Subjective:  Teresa Murillo 61 y.o., female patient with a past psychiatric history of schizophrenia, aggressive behavior, and borderline intellectual disability who initially presented to [facility] on 11/23/2021 via [facility].  At that time patient had been dismissed from the group home due to multiple elopements.  Patient has remained at the [facility] while awaiting new placement.  Social work and DSS are involved.  Patient was complaining of body aches and chest pain on 01/23/2022 she was sent to the [facility] ED to be medically evaluated.  Patient was treated and medically cleared to return to [facility].  Patient continue to be psychiatrically cleared awaiting appropriate placement.  Social work and DSS continue to seek placement.  Teresa Murillo, 61 y.o., female patient seen face to face by this provider, consulted with Dr. Hampton Abbot, and chart reviewed on 01/25/22. Patient continues to deny suicidal/self-harm/homicidal ideation, psychosis, and paranoia.  States she is tolerating medications without adverse reaction.  States she is eating and sleeping without difficulty  Patient states no one has told her what is going on or what progress has been made. .     During evaluation Teresa Murillo is lying in bed with no noted distress.  She denies any medical complaints at this time including chest pain.  She is alert/oriented x 4, calm, cooperative, and attentive.  She denies depression, anxiety, suicidal/self-harm/homicidal ideation,psychosis, ad paranoia.  Her mood is euthymic wit congruent affect.  She has normal speech and behavior.  She is able to converse coherently with goal-directed thoughts, no distractibility or internal preoccupation.  Patient continues to remain psychiatrically cleared.  She remains boarding on the unit while social work and DSS is seek appropriate placement.    Diagnosis:   Final diagnoses:  Schizophrenia, unspecified type ([place])  At risk for self care deficit  Noncompliance  Type 2 diabetes mellitus without complication, unspecified whether long term insulin use ([place])    Total Time spent with patient: 15 minutes  Past Psychiatric History:  Past Medical History:  Past Medical History:  Diagnosis Date   Borderline intellectual functioning 12/14/2021   On 12/14/2021: Appreciate assistance from psychology consult. On the Wechsler Adult Intelligence Scale-4, Teresa Murillo achieved a full-scale IQ score of 73 and a percentile rank of 4 placing her in the borderline range of intellectual functioning.    Chronic obstructive pulmonary disease (COPD) (HCC)    Glaucoma    Hyperlipidemia    Hypertension    Iron deficiency    Schizoaffective disorder (HCC)    Type 2 diabetes mellitus (HCC)     Past Surgical History:  Procedure Laterality Date   TUBAL LIGATION     Family History:  Family History  Problem Relation Age of Onset   Breast cancer Maternal Grandmother    Family Psychiatric  History: schizophrenia, aggressive behavior, and borderline intellectual disability Social History:  Social History   Substance and Sexual Activity  Alcohol Use Yes     Social History   Substance and Sexual Activity  Drug Use Not Currently    Social History   Socioeconomic History   Marital status: Divorced    Spouse name: Not on file   Number of children: Not on file   Years of education: Not on file   Highest education level: Not on file  Occupational History   Not on file  Tobacco Use   Smoking status: Every Day  Packs/day: 1.00    Types: Cigars, Cigarettes   Smokeless tobacco: Current  Vaping Use   Vaping Use: Never used  Substance and Sexual Activity   Alcohol use: Yes   Drug use: Not Currently   Sexual activity: Not Currently    Birth control/protection: Surgical  Other Topics Concern   Not on file  Social History Narrative   Not on file    Social Determinants of Health   Financial Resource Strain: Not on file  Food Insecurity: Not on file  Transportation Needs: Not on file  Physical Activity: Not on file  Stress: Not on file  Social Connections: Not on file   SDOH:  SDOH Screenings   Depression (PHQ2-9): Low Risk  (11/23/2021)  Tobacco Use: High Risk (01/25/2022)   Additional Social History:    Sleep: Good  Appetite:  Good  Current Medications:  Current Facility-Administered Medications  Medication Dose Route Frequency Provider Last Rate Last Admin   acetaminophen (TYLENOL) tablet 650 mg  650 mg Oral Q6H PRN Revonda Humphrey, NP       albuterol (VENTOLIN HFA) 108 (90 Base) MCG/ACT inhaler 2 puff  2 puff Inhalation Q6H PRN Revonda Humphrey, NP       alum & mag hydroxide-simeth (MAALOX/MYLANTA) 200-200-20 MG/5ML suspension 30 mL  30 mL Oral Q4H PRN Revonda Humphrey, NP       apixaban Arne Cleveland) tablet 5 mg  5 mg Oral BID Revonda Humphrey, NP   5 mg at 01/25/22 0917   ARIPiprazole (ABILIFY) tablet 10 mg  10 mg Oral Daily Revonda Humphrey, NP   10 mg at 01/25/22 0917   cloZAPine (CLOZARIL) tablet 50 mg  50 mg Oral Daily Revonda Humphrey, NP   50 mg at 01/25/22 0917   cloZAPine (CLOZARIL) tablet 75 mg  75 mg Oral QHS Revonda Humphrey, NP   75 mg at 01/24/22 2156   diltiazem (CARDIZEM CD) 24 hr capsule 240 mg  240 mg Oral Daily Revonda Humphrey, NP   240 mg at 01/25/22 0916   divalproex (DEPAKOTE ER) 24 hr tablet 500 mg  500 mg Oral BID Revonda Humphrey, NP   500 mg at 01/25/22 0917   fluticasone furoate-vilanterol (BREO ELLIPTA) 200-25 MCG/ACT 1 puff  1 puff Inhalation Daily Revonda Humphrey, NP   1 puff at 01/25/22 0175   haloperidol (HALDOL) tablet 10 mg  10 mg Oral TID PRN Revonda Humphrey, NP       hydrOXYzine (ATARAX) tablet 25 mg  25 mg Oral TID PRN Revonda Humphrey, NP       insulin aspart (novoLOG) injection 0-9 Units  0-9 Units Subcutaneous TID WC Revonda Humphrey, NP   1 Units at  01/25/22 0743   insulin glargine-yfgn (SEMGLEE) injection 8 Units  8 Units Subcutaneous Q1200 Revonda Humphrey, NP   8 Units at 01/24/22 1356   latanoprost (XALATAN) 0.005 % ophthalmic solution 1 drop  1 drop Both Eyes QHS Thomes Lolling H, NP   1 drop at 01/24/22 2159   magnesium hydroxide (MILK OF MAGNESIA) suspension 30 mL  30 mL Oral Daily PRN Revonda Humphrey, NP       metFORMIN (GLUCOPHAGE-XR) 24 hr tablet 1,000 mg  1,000 mg Oral BID WC Revonda Humphrey, NP   1,000 mg at 01/25/22 1025   nicotine (NICODERM CQ - dosed in mg/24 hr) patch 7 mg  7 mg Transdermal Daily Revonda Humphrey, NP   7  mg at 01/25/22 0918   traZODone (DESYREL) tablet 50 mg  50 mg Oral QHS PRN Revonda Humphrey, NP       valbenazine [facility]) capsule 40 mg  40 mg Oral q AM Revonda Humphrey, NP   40 mg at 01/25/22 0620   [START ON 01/28/2022] Vitamin D (Ergocalciferol) (DRISDOL) 1.25 MG (50000 UNIT) capsule 50,000 Units  50,000 Units Oral Q Sharren Bridge, NP       Current Outpatient Medications  Medication Sig Dispense Refill   Accu-Chek Softclix Lancets lancets Use as directed up to four times daily 100 each 0   apixaban (ELIQUIS) 5 MG TABS tablet Take 1 tablet (5 mg total) by mouth 2 (two) times daily. 60 tablet 0   ARIPiprazole (ABILIFY) 10 MG tablet Take 1 tablet (10 mg total) by mouth daily. 30 tablet 0   Blood Glucose Monitoring Suppl (ACCU-CHEK GUIDE) w/Device KIT Use as directed up to four times daily 1 kit 0   budesonide-formoterol (SYMBICORT) 160-4.5 MCG/ACT inhaler Inhale 2 puffs into the lungs in the morning and at bedtime.     Cholecalciferol (VITAMIN D3) 1.25 MG (50000 UT) CAPS Take 50,000 Units by mouth every Thursday.     cloZAPine (CLOZARIL) 25 MG tablet Take 3 tablets (75 mg total) by mouth at bedtime. 90 tablet 0   clozapine (CLOZARIL) 50 MG tablet Take 1 tablet (50 mg total) by mouth daily. 30 tablet 0   diltiazem (CARDIZEM CD) 240 MG 24 hr capsule Take 1 capsule (240 mg total) by  mouth daily. (Patient not taking: Reported on 11/24/2021) 30 capsule 0   divalproex (DEPAKOTE ER) 500 MG 24 hr tablet Take 1 tablet (500 mg total) by mouth 2 (two) times daily. 60 tablet 0   glucose blood test strip Use as directed up to four times daily 50 each 0   haloperidol (HALDOL) 10 MG tablet Take 1 tablet (10 mg total) by mouth 3 (three) times daily as needed for agitation (and psychotic symptoms).     INGREZZA 40 MG capsule Take 1 capsule (40 mg total) by mouth in the morning. 30 capsule 0   insulin aspart (NOVOLOG) 100 UNIT/ML FlexPen Before each meal 3 times a day, 140-199 - 2 units, 200-250 - 4 units, 251-299 - 6 units,  300-349 - 8 units,  350 or above 10 units. Insulin PEN if approved, provide syringes and needles if needed.Please switch to any approved short acting Insulin if needed. 15 mL 0   insulin glargine (LANTUS) 100 UNIT/ML Solostar Pen Inject 12 Units into the skin daily. 15 mL 0   Insulin Pen Needle 32G X 4 MM MISC Use 4 times a day with insulin, 1 month supply. 100 each 0   latanoprost (XALATAN) 0.005 % ophthalmic solution Place 1 drop into both eyes at bedtime.     metFORMIN (GLUCOPHAGE) 500 MG tablet Take 500 mg by mouth 2 (two) times daily with a meal.     nicotine (NICODERM CQ - DOSED IN MG/24 HR) 7 mg/24hr patch Place 1 patch (7 mg total) onto the skin daily. 28 patch 0   PROAIR HFA 108 (90 Base) MCG/ACT inhaler Inhale 2 puffs into the lungs every 6 (six) hours as needed for wheezing or shortness of breath.      Labs  Lab Results:  Admission on 01/24/2022  Component Date Value Ref Range Status   Glucose-Capillary 01/24/2022 143 (H)  70 - 99 mg/dL Final   Glucose reference range applies only  to samples taken after fasting for at least 8 hours.   Glucose-Capillary 01/24/2022 120 (H)  70 - 99 mg/dL Final   Glucose reference range applies only to samples taken after fasting for at least 8 hours.   Glucose-Capillary 01/25/2022 122 (H)  70 - 99 mg/dL Final   Glucose  reference range applies only to samples taken after fasting for at least 8 hours.  Admission on 01/23/2022, Discharged on 01/24/2022  Component Date Value Ref Range Status   Sodium 01/23/2022 140  135 - 145 mmol/L Final   Potassium 01/23/2022 4.4  3.5 - 5.1 mmol/L Final   Chloride 01/23/2022 101  98 - 111 mmol/L Final   CO2 01/23/2022 25  22 - 32 mmol/L Final   Glucose, Bld 01/23/2022 198 (H)  70 - 99 mg/dL Final   Glucose reference range applies only to samples taken after fasting for at least 8 hours.   BUN 01/23/2022 13  6 - 20 mg/dL Final   Creatinine, Ser 01/23/2022 0.62  0.44 - 1.00 mg/dL Final   Calcium 01/23/2022 9.3  8.9 - 10.3 mg/dL Final   GFR, Estimated 01/23/2022 >60  >60 mL/min Final   Comment: (NOTE) Calculated using the CKD-EPI Creatinine Equation (2021)    Anion gap 01/23/2022 14  5 - 15 Final   Performed at [facility], [place] [address], [place], [place] [address]   WBC 01/23/2022 5.8  4.0 - 10.5 K/uL Final   RBC 01/23/2022 4.63  3.87 - 5.11 MIL/uL Final   Hemoglobin 01/23/2022 11.7 (L)  12.0 - 15.0 g/dL Final   HCT 01/23/2022 37.4  36.0 - 46.0 % Final   MCV 01/23/2022 80.8  80.0 - 100.0 fL Final   MCH 01/23/2022 25.3 (L)  26.0 - 34.0 pg Final   MCHC 01/23/2022 31.3  30.0 - 36.0 g/dL Final   RDW 01/23/2022 17.9 (H)  11.5 - 15.5 % Final   Platelets 01/23/2022 333  150 - 400 K/uL Final   nRBC 01/23/2022 0.0  0.0 - 0.2 % Final   Performed at [place] [address], [place], [place] [address]   Troponin I (High Sensitivity) 01/23/2022 6  <18 ng/L Final   Comment: (NOTE) Elevated high sensitivity troponin I (hsTnI) values and significant  changes across serial measurements may suggest ACS but many other  chronic and acute conditions are known to elevate hsTnI results.  Refer to the "Links" section for chest pain algorithms and additional  guidance. Performed at [facility], [place] [address], [place], [place] [address]    Troponin I (High  Sensitivity) 01/23/2022 5  <18 ng/L Final   Comment: (NOTE) Elevated high sensitivity troponin I (hsTnI) values and significant  changes across serial measurements may suggest ACS but many other  chronic and acute conditions are known to elevate hsTnI results.  Refer to the "Links" section for chest pain algorithms and additional  guidance. Performed at [facility], [place] [address], [place], [place] [address]    SARS Coronavirus 2 by RT PCR 01/23/2022 NEGATIVE  NEGATIVE Final   Comment: (NOTE) SARS-CoV-2 target nucleic acids are NOT DETECTED.  The SARS-CoV-2 RNA is generally detectable in upper and lower respiratory specimens during the acute phase of infection. The lowest concentration of SARS-CoV-2 viral copies this assay can detect is 250 copies / mL. A negative result does not preclude SARS-CoV-2 infection and should not be used as the sole basis for treatment or other patient management  decisions.  A negative result may occur with improper specimen collection / handling, submission of specimen other than nasopharyngeal swab, presence of viral mutation(s) within the areas targeted by this assay, and inadequate number of viral copies (<250 copies / mL). A negative result must be combined with clinical observations, patient history, and epidemiological information.  Fact Sheet for Patients:   https://www.patel.info/  Fact Sheet for Healthcare Providers: https://hall.com/  This test is not yet approved or                           cleared by the [place] FDA and has been authorized for detection and/or diagnosis of SARS-CoV-2 by FDA under an Emergency Use Authorization (EUA).  This EUA will remain in effect (meaning this test can be used) for the duration of the COVID-19 declaration under Section 564(b)(1) of the Act, 21 U.S.C. section 360bbb-3(b)(1), unless the authorization is terminated or revoked sooner.  Performed at  [facility], [place] [address] [place], [place] [address]    B Natriuretic Peptide 01/23/2022 41.3  0.0 - 100.0 pg/mL Final   Performed at [place] [address] [place], [place] [address]   Group A Strep by PCR 01/23/2022 NOT DETECTED  NOT DETECTED Final   Performed at [facility], [place] [address], [place], [place] [address]   Color, Urine 01/23/2022 YELLOW  YELLOW Final   APPearance 01/23/2022 CLEAR  CLEAR Final   Specific Gravity, Urine 01/23/2022 1.018  1.005 - 1.030 Final   pH 01/23/2022 7.0  5.0 - 8.0 Final   Glucose, UA 01/23/2022 NEGATIVE  NEGATIVE mg/dL Final   Hgb urine dipstick 01/23/2022 NEGATIVE  NEGATIVE Final   Bilirubin Urine 01/23/2022 NEGATIVE  NEGATIVE Final   Ketones, ur 01/23/2022 NEGATIVE  NEGATIVE mg/dL Final   Protein, ur 01/23/2022 NEGATIVE  NEGATIVE mg/dL Final   Nitrite 01/23/2022 NEGATIVE  NEGATIVE Final   Leukocytes,Ua 01/23/2022 TRACE (A)  NEGATIVE Final   RBC / HPF 01/23/2022 0-5  0 - 5 RBC/hpf Final   WBC, UA 01/23/2022 0-5  0 - 5 WBC/hpf Final   Bacteria, UA 01/23/2022 NONE SEEN  NONE SEEN Final   Squamous Epithelial / LPF 01/23/2022 0-5  0 - 5 Final   Mucus 01/23/2022 PRESENT   Final   Performed at [facility], [place] [address] [place], [place] [address]   SARS Coronavirus 2 by RT PCR 01/23/2022 NEGATIVE  NEGATIVE Final   Comment: (NOTE) SARS-CoV-2 target nucleic acids are NOT DETECTED.  The SARS-CoV-2 RNA is generally detectable in upper respiratory specimens during the acute phase of infection. The lowest concentration of SARS-CoV-2 viral copies this assay can detect is 138 copies/mL. A negative result does not preclude SARS-Cov-2 infection and should not be used as the sole basis for treatment or other patient management decisions. A negative result may occur with  improper specimen collection/handling, submission of specimen other than nasopharyngeal swab, presence of viral mutation(s) within the areas targeted by  this assay, and inadequate number of viral copies(<138 copies/mL). A negative result must be combined with clinical observations, patient history, and epidemiological information. The expected result is Negative.  Fact Sheet for Patients:  EntrepreneurPulse.com.au  Fact Sheet for Healthcare Providers:  IncredibleEmployment.be  This test is no                          t yet approved or cleared  by the [place] and  has been authorized for detection and/or diagnosis of SARS-CoV-2 by FDA under an Emergency Use Authorization (EUA). This EUA will remain  in effect (meaning this test can be used) for the duration of the COVID-19 declaration under Section 564(b)(1) of the Act, 21 U.S.C.section 360bbb-3(b)(1), unless the authorization is terminated  or revoked sooner.       Influenza A by PCR 01/23/2022 NEGATIVE  NEGATIVE Final   Influenza B by PCR 01/23/2022 NEGATIVE  NEGATIVE Final   Comment: (NOTE) The Xpert Xpress SARS-CoV-2/FLU/RSV plus assay is intended as an aid in the diagnosis of influenza from Nasopharyngeal swab specimens and should not be used as a sole basis for treatment. Nasal washings and aspirates are unacceptable for Xpert Xpress SARS-CoV-2/FLU/RSV testing.  Fact Sheet for Patients: EntrepreneurPulse.com.au  Fact Sheet for Healthcare Providers: IncredibleEmployment.be  This test is not yet approved or cleared by the [place] FDA and has been authorized for detection and/or diagnosis of SARS-CoV-2 by FDA under an Emergency Use Authorization (EUA). This EUA will remain in effect (meaning this test can be used) for the duration of the COVID-19 declaration under Section 564(b)(1) of the Act, 21 U.S.C. section 360bbb-3(b)(1), unless the authorization is terminated or revoked.  Performed at [facility], [place] [address], [place], [place] [address]    WBC 01/24/2022 6.1  4.0 -  10.5 K/uL Final   RBC 01/24/2022 4.60  3.87 - 5.11 MIL/uL Final   Hemoglobin 01/24/2022 11.7 (L)  12.0 - 15.0 g/dL Final   HCT 01/24/2022 37.0  36.0 - 46.0 % Final   MCV 01/24/2022 80.4  80.0 - 100.0 fL Final   MCH 01/24/2022 25.4 (L)  26.0 - 34.0 pg Final   MCHC 01/24/2022 31.6  30.0 - 36.0 g/dL Final   RDW 01/24/2022 17.6 (H)  11.5 - 15.5 % Final   Platelets 01/24/2022 334  150 - 400 K/uL Final   nRBC 01/24/2022 0.0  0.0 - 0.2 % Final   Neutrophils Relative % 01/24/2022 53  % Final   Neutro Abs 01/24/2022 3.3  1.7 - 7.7 K/uL Final   Lymphocytes Relative 01/24/2022 33  % Final   Lymphs Abs 01/24/2022 2.0  0.7 - 4.0 K/uL Final   Monocytes Relative 01/24/2022 11  % Final   Monocytes Absolute 01/24/2022 0.7  0.1 - 1.0 K/uL Final   Eosinophils Relative 01/24/2022 2  % Final   Eosinophils Absolute 01/24/2022 0.1  0.0 - 0.5 K/uL Final   Basophils Relative 01/24/2022 1  % Final   Basophils Absolute 01/24/2022 0.1  0.0 - 0.1 K/uL Final   Immature Granulocytes 01/24/2022 0  % Final   Abs Immature Granulocytes 01/24/2022 0.02  0.00 - 0.07 K/uL Final   Performed at [facility], [place] [address] [place],  [address]   Glucose-Capillary 01/24/2022 110 (H)  70 - 99 mg/dL Final   Glucose reference range applies only to samples taken after fasting for at least 8 hours.  No results displayed because visit has over 200 results.    Admission on 11/22/2021, Discharged on 11/22/2021  Component Date Value Ref Range Status   Glucose-Capillary 11/22/2021 169 (H)  70 - 99 mg/dL Final   Glucose reference range applies only to samples taken after fasting for at least 8 hours.  No results displayed because visit has over 200 results.    Admission on 10/04/2021, Discharged on 10/22/2021  Component Date Value Ref Range Status   SARS  Coronavirus 2 by RT PCR 10/04/2021 NEGATIVE  NEGATIVE Final   Comment: (NOTE) SARS-CoV-2 target nucleic acids are NOT DETECTED.  The SARS-CoV-2 RNA is generally  detectable in upper respiratory specimens during the acute phase of infection. The lowest concentration of SARS-CoV-2 viral copies this assay can detect is 138 copies/mL. A negative result does not preclude SARS-Cov-2 infection and should not be used as the sole basis for treatment or other patient management decisions. A negative result may occur with  improper specimen collection/handling, submission of specimen other than nasopharyngeal swab, presence of viral mutation(s) within the areas targeted by this assay, and inadequate number of viral copies(<138 copies/mL). A negative result must be combined with clinical observations, patient history, and epidemiological information. The expected result is Negative.  Fact Sheet for Patients:  EntrepreneurPulse.com.au  Fact Sheet for Healthcare Providers:  IncredibleEmployment.be  This test is no                          t yet approved or cleared by the [place] FDA and  has been authorized for detection and/or diagnosis of SARS-CoV-2 by FDA under an Emergency Use Authorization (EUA). This EUA will remain  in effect (meaning this test can be used) for the duration of the COVID-19 declaration under Section 564(b)(1) of the Act, 21 U.S.C.section 360bbb-3(b)(1), unless the authorization is terminated  or revoked sooner.       Influenza A by PCR 10/04/2021 NEGATIVE  NEGATIVE Final   Influenza B by PCR 10/04/2021 NEGATIVE  NEGATIVE Final   Comment: (NOTE) The Xpert Xpress SARS-CoV-2/FLU/RSV plus assay is intended as an aid in the diagnosis of influenza from Nasopharyngeal swab specimens and should not be used as a sole basis for treatment. Nasal washings and aspirates are unacceptable for Xpert Xpress SARS-CoV-2/FLU/RSV testing.  Fact Sheet for Patients: EntrepreneurPulse.com.au  Fact Sheet for Healthcare Providers: IncredibleEmployment.be  This test is not  yet approved or cleared by the [place] FDA and has been authorized for detection and/or diagnosis of SARS-CoV-2 by FDA under an Emergency Use Authorization (EUA). This EUA will remain in effect (meaning this test can be used) for the duration of the COVID-19 declaration under Section 564(b)(1) of the Act, 21 U.S.C. section 360bbb-3(b)(1), unless the authorization is terminated or revoked.  Performed at [facility], [place] [address], [place], [place] [address]    WBC 10/04/2021 8.4  4.0 - 10.5 K/uL Final   RBC 10/04/2021 4.42  3.87 - 5.11 MIL/uL Final   Hemoglobin 10/04/2021 11.6 (L)  12.0 - 15.0 g/dL Final   HCT 10/04/2021 35.7 (L)  36.0 - 46.0 % Final   MCV 10/04/2021 80.8  80.0 - 100.0 fL Final   MCH 10/04/2021 26.2  26.0 - 34.0 pg Final   MCHC 10/04/2021 32.5  30.0 - 36.0 g/dL Final   RDW 10/04/2021 16.0 (H)  11.5 - 15.5 % Final   Platelets 10/04/2021 372  150 - 400 K/uL Final   nRBC 10/04/2021 0.0  0.0 - 0.2 % Final   Neutrophils Relative % 10/04/2021 68  % Final   Neutro Abs 10/04/2021 5.7  1.7 - 7.7 K/uL Final   Lymphocytes Relative 10/04/2021 22  % Final   Lymphs Abs 10/04/2021 1.8  0.7 - 4.0 K/uL Final   Monocytes Relative 10/04/2021 8  % Final   Monocytes Absolute 10/04/2021 0.7  0.1 - 1.0 K/uL Final   Eosinophils Relative 10/04/2021 1  % Final  Eosinophils Absolute 10/04/2021 0.1  0.0 - 0.5 K/uL Final   Basophils Relative 10/04/2021 1  % Final   Basophils Absolute 10/04/2021 0.1  0.0 - 0.1 K/uL Final   Immature Granulocytes 10/04/2021 0  % Final   Abs Immature Granulocytes 10/04/2021 0.03  0.00 - 0.07 K/uL Final   Performed at [facility], [place] [address] [place], [place] [address]   Sodium 10/04/2021 136  135 - 145 mmol/L Final   Potassium 10/04/2021 4.2  3.5 - 5.1 mmol/L Final   Chloride 10/04/2021 104  98 - 111 mmol/L Final   CO2 10/04/2021 25  22 - 32 mmol/L Final   Glucose, Bld 10/04/2021 100 (H)  70 - 99 mg/dL Final   Glucose reference range  applies only to samples taken after fasting for at least 8 hours.   BUN 10/04/2021 9  6 - 20 mg/dL Final   Creatinine, Ser 10/04/2021 0.52  0.44 - 1.00 mg/dL Final   Calcium 10/04/2021 9.0  8.9 - 10.3 mg/dL Final   Total Protein 10/04/2021 7.0  6.5 - 8.1 g/dL Final   Albumin 10/04/2021 3.2 (L)  3.5 - 5.0 g/dL Final   AST 10/04/2021 13 (L)  15 - 41 U/L Final   ALT 10/04/2021 10  0 - 44 U/L Final   Alkaline Phosphatase 10/04/2021 61  38 - 126 U/L Final   Total Bilirubin 10/04/2021 0.3  0.3 - 1.2 mg/dL Final   GFR, Estimated 10/04/2021 >60  >60 mL/min Final   Comment: (NOTE) Calculated using the CKD-EPI Creatinine Equation (2021)    Anion gap 10/04/2021 7  5 - 15 Final   Performed at [facility] [address], [place], [place] [address]   Hgb A1c MFr Bld 10/04/2021 7.0 (H)  4.8 - 5.6 % Final   Comment: (NOTE) Pre diabetes:          5.7%-6.4%  Diabetes:              >6.4%  Glycemic control for   <7.0% adults with diabetes    Mean Plasma Glucose 10/04/2021 154.2  mg/dL Final   Performed at [facility], [place] [address], [place], [place] [address]   Cholesterol 10/04/2021 178  0 - 200 mg/dL Final   Triglycerides 10/04/2021 155 (H)  <150 mg/dL Final   HDL 10/04/2021 52  >40 mg/dL Final   Total CHOL/HDL Ratio 10/04/2021 3.4  RATIO Final   VLDL 10/04/2021 31  0 - 40 mg/dL Final   LDL Cholesterol 10/04/2021 95  0 - 99 mg/dL Final   Comment:        Total Cholesterol/HDL:CHD Risk Coronary Heart Disease Risk Table                     Men   Women  1/2 Average Risk   3.4   3.3  Average Risk       5.0   4.4  2 X Average Risk   9.6   7.1  3 X Average Risk  23.4   11.0        Use the calculated Patient Ratio above and the CHD Risk Table to determine the patient's CHD Risk.        ATP III CLASSIFICATION (LDL):  <100     mg/dL   Optimal  100-129  mg/dL   Near or Above  Optimal  130-159  mg/dL   Borderline  160-189  mg/dL   High  >190     mg/dL   Very  High Performed at [facility] [address], [place], [place] [address]    POC Amphetamine UR 10/04/2021 None Detected  NONE DETECTED (Cut Off Level 1000 ng/mL) Final   POC Secobarbital (BAR) 10/04/2021 None Detected  NONE DETECTED (Cut Off Level 300 ng/mL) Final   POC Buprenorphine (BUP) 10/04/2021 None Detected  NONE DETECTED (Cut Off Level 10 ng/mL) Final   POC Oxazepam (BZO) 10/04/2021 None Detected  NONE DETECTED (Cut Off Level 300 ng/mL) Final   POC Cocaine UR 10/04/2021 None Detected  NONE DETECTED (Cut Off Level 300 ng/mL) Final   POC Methamphetamine UR 10/04/2021 None Detected  NONE DETECTED (Cut Off Level 1000 ng/mL) Final   POC Morphine 10/04/2021 None Detected  NONE DETECTED (Cut Off Level 300 ng/mL) Final   POC Methadone UR 10/04/2021 None Detected  NONE DETECTED (Cut Off Level 300 ng/mL) Final   POC Oxycodone UR 10/04/2021 Positive (A)  NONE DETECTED (Cut Off Level 100 ng/mL) Final   POC Marijuana UR 10/04/2021 None Detected  NONE DETECTED (Cut Off Level 50 ng/mL) Final   SARSCOV2ONAVIRUS 2 AG 10/04/2021 NEGATIVE  NEGATIVE Final   Comment: (NOTE) SARS-CoV-2 antigen NOT DETECTED.   Negative results are presumptive.  Negative results do not preclude SARS-CoV-2 infection and should not be used as the sole basis for treatment or other patient management decisions, including infection  control decisions, particularly in the presence of clinical signs and  symptoms consistent with COVID-19, or in those who have been in contact with the virus.  Negative results must be combined with clinical observations, patient history, and epidemiological information. The expected result is Negative.  Fact Sheet for Patients: HandmadeRecipes.com.cy  Fact Sheet for Healthcare Providers: FuneralLife.at  This test is not yet approved or cleared by the [place] FDA and  has been authorized for detection and/or diagnosis of SARS-CoV-2  by FDA under an Emergency Use Authorization (EUA).  This EUA will remain in effect (meaning this test can be used) for the duration of  the COV                          ID-19 declaration under Section 564(b)(1) of the Act, 21 U.S.C. section 360bbb-3(b)(1), unless the authorization is terminated or revoked sooner.     Valproic Acid Lvl 10/04/2021 51  50.0 - 100.0 ug/mL Final   Performed at [facility] [address], [place], [place] [address]   Valproic Acid Lvl 10/08/2021 57  50.0 - 100.0 ug/mL Final   Performed at [facility] [address] [place], [place] [address]   Glucose-Capillary 10/09/2021 123 (H)  70 - 99 mg/dL Final   Glucose reference range applies only to samples taken after fasting for at least 8 hours.  Admission on 09/09/2021, Discharged on 09/10/2021  Component Date Value Ref Range Status   Sodium 09/09/2021 134 (L)  135 - 145 mmol/L Final   Potassium 09/09/2021 4.3  3.5 - 5.1 mmol/L Final   Chloride 09/09/2021 99  98 - 111 mmol/L Final   CO2 09/09/2021 25  22 - 32 mmol/L Final   Glucose, Bld 09/09/2021 107 (H)  70 - 99 mg/dL Final   Glucose reference range applies only to samples taken after fasting for at least 8 hours.   BUN 09/09/2021 12  6 - 20 mg/dL Final   Creatinine, Ser 09/09/2021 0.74  0.44 - 1.00 mg/dL Final   Calcium 09/09/2021 9.0  8.9 - 10.3 mg/dL Final   Total Protein 09/09/2021 7.4  6.5 - 8.1 g/dL Final   Albumin 09/09/2021 3.1 (L)  3.5 - 5.0 g/dL Final   AST 09/09/2021 13 (L)  15 - 41 U/L Final   ALT 09/09/2021 13  0 - 44 U/L Final   Alkaline Phosphatase 09/09/2021 66  38 - 126 U/L Final   Total Bilirubin 09/09/2021 0.2 (L)  0.3 - 1.2 mg/dL Final   GFR, Estimated 09/09/2021 >60  >60 mL/min Final   Comment: (NOTE) Calculated using the CKD-EPI Creatinine Equation (2021)    Anion gap 09/09/2021 10  5 - 15 Final   Performed at [facility], [place] [address] [place], [place] [address]   WBC 09/09/2021 8.5  4.0 - 10.5 K/uL Final    RBC 09/09/2021 4.53  3.87 - 5.11 MIL/uL Final   Hemoglobin 09/09/2021 11.8 (L)  12.0 - 15.0 g/dL Final   HCT 09/09/2021 38.0  36.0 - 46.0 % Final   MCV 09/09/2021 83.9  80.0 - 100.0 fL Final   MCH 09/09/2021 26.0  26.0 - 34.0 pg Final   MCHC 09/09/2021 31.1  30.0 - 36.0 g/dL Final   RDW 09/09/2021 17.8 (H)  11.5 - 15.5 % Final   Platelets 09/09/2021 442 (H)  150 - 400 K/uL Final   nRBC 09/09/2021 0.0  0.0 - 0.2 % Final   Neutrophils Relative % 09/09/2021 70  % Final   Neutro Abs 09/09/2021 5.9  1.7 - 7.7 K/uL Final   Lymphocytes Relative 09/09/2021 20  % Final   Lymphs Abs 09/09/2021 1.7  0.7 - 4.0 K/uL Final   Monocytes Relative 09/09/2021 8  % Final   Monocytes Absolute 09/09/2021 0.7  0.1 - 1.0 K/uL Final   Eosinophils Relative 09/09/2021 1  % Final   Eosinophils Absolute 09/09/2021 0.1  0.0 - 0.5 K/uL Final   Basophils Relative 09/09/2021 1  % Final   Basophils Absolute 09/09/2021 0.1  0.0 - 0.1 K/uL Final   Immature Granulocytes 09/09/2021 0  % Final   Abs Immature Granulocytes 09/09/2021 0.03  0.00 - 0.07 K/uL Final   Performed at [facility], [place] [address] [place], [place] [address]   Ammonia 09/09/2021 37 (H)  9 - 35 umol/L Final   Comment: HEMOLYSIS AT THIS LEVEL MAY AFFECT RESULT Performed at [facility], [place] [address] [place], [place] [address]    Opiates 09/09/2021 NONE DETECTED  NONE DETECTED Final   Cocaine 09/09/2021 NONE DETECTED  NONE DETECTED Final   Benzodiazepines 09/09/2021 NONE DETECTED  NONE DETECTED Final   Amphetamines 09/09/2021 NONE DETECTED  NONE DETECTED Final   Tetrahydrocannabinol 09/09/2021 NONE DETECTED  NONE DETECTED Final   Barbiturates 09/09/2021 NONE DETECTED  NONE DETECTED Final   Comment: (NOTE) DRUG SCREEN FOR MEDICAL PURPOSES ONLY.  IF CONFIRMATION IS NEEDED FOR ANY PURPOSE, NOTIFY LAB WITHIN 5 DAYS.  LOWEST DETECTABLE LIMITS FOR URINE DRUG SCREEN Drug Class                     Cutoff (ng/mL) Amphetamine and  metabolites    1000 Barbiturate and metabolites    200 Benzodiazepine                 812 Tricyclics and metabolites     300 Opiates and metabolites  300 Cocaine and metabolites        300 THC                            50 Performed at [facility], [place] [address] [place], [place] [address]    Alcohol, Ethyl (B) 09/09/2021 <10  <10 mg/dL Final   Comment: (NOTE) Lowest detectable limit for serum alcohol is 10 mg/dL.  For medical purposes only. Performed at [facility], [place] [address] [place], [place] [address]    Lipase 09/09/2021 33  11 - 51 U/L Final   Performed at [facility] [address], [place], [place] [address]   Color, Urine 09/09/2021 COLORLESS (A)  YELLOW Final   APPearance 09/09/2021 CLEAR  CLEAR Final   Specific Gravity, Urine 09/09/2021 1.002 (L)  1.005 - 1.030 Final   pH 09/09/2021 6.0  5.0 - 8.0 Final   Glucose, UA 09/09/2021 NEGATIVE  NEGATIVE mg/dL Final   Hgb urine dipstick 09/09/2021 NEGATIVE  NEGATIVE Final   Bilirubin Urine 09/09/2021 NEGATIVE  NEGATIVE Final   Ketones, ur 09/09/2021 NEGATIVE  NEGATIVE mg/dL Final   Protein, ur 09/09/2021 NEGATIVE  NEGATIVE mg/dL Final   Nitrite 09/09/2021 NEGATIVE  NEGATIVE Final   Leukocytes,Ua 09/09/2021 NEGATIVE  NEGATIVE Final   Performed at [facility] [address], [place], [place] [address]   Specimen Description 09/09/2021 URINE, CLEAN CATCH   Final   Special Requests 09/09/2021 NONE   Final   Culture 09/09/2021  (A)   Final                   Value:<10,000 COLONIES/mL INSIGNIFICANT GROWTH Performed at [facility], [place] [address], [place], [place] [address]    Report Status 09/09/2021 09/10/2021 FINAL   Final   D-Dimer, Quant 09/09/2021 0.92 (H)  0.00 - 0.50 ug/mL-FEU Final   Comment: (NOTE) At the manufacturer cut-off value of 0.5 g/mL FEU, this assay has a negative predictive value of 95-100%.This assay is intended for use in conjunction with a clinical pretest  probability (PTP) assessment model to exclude pulmonary embolism (PE) and deep venous thrombosis (DVT) in outpatients suspected of PE or DVT. Results should be correlated with clinical presentation. Performed at [facility], [place] [address], [place], [place] [address]    Troponin I (High Sensitivity) 09/09/2021 5  <18 ng/L Final   Comment: (NOTE) Elevated high sensitivity troponin I (hsTnI) values and significant  changes across serial measurements may suggest ACS but many other  chronic and acute conditions are known to elevate hsTnI results.  Refer to the "Links" section for chest pain algorithms and additional  guidance. Performed at [facility], [place] [address], [place], [place] [address]    BP 09/10/2021 135/83  mmHg Final   S' Lateral 09/10/2021 2.30  cm Final   Area-P 1/2 09/10/2021 5.58  cm2 Final    Blood Alcohol level:  Lab Results  Component Value Date   ETH <10 11/23/2021   ETH <10 38/18/2993    Metabolic Disorder Labs: Lab Results  Component Value Date   HGBA1C 6.7 (H) 12/28/2021   MPG 145.59 12/28/2021   MPG 154.2 10/04/2021   Lab Results  Component Value Date   PROLACTIN 3.8 (L) 11/23/2021   Lab Results  Component Value Date   CHOL 171 11/23/2021   TRIG 125 11/23/2021   HDL 65 11/23/2021  CHOLHDL 2.6 11/23/2021   VLDL 25 11/23/2021   LDLCALC 81 11/23/2021   LDLCALC 95 10/04/2021    Therapeutic Lab Levels: No results found for: "LITHIUM" Lab Results  Component Value Date   VALPROATE 33 (L) 01/18/2022   VALPROATE 33 (L) 12/28/2021   No results found for: "CBMZ"  Physical Findings   PHQ2-9    Flowsheet Row ED [facility]23/2021 in [facility]  PHQ-2 Total Score 2  PHQ-9 Total Score 3      Flowsheet Row ED from 01/23/2022 in [facility] ED from 11/23/2021 in [facility] ED from 11/22/2021 in [facility]  DEPT  C-SSRS RISK CATEGORY No Risk No Risk No Risk        Musculoskeletal  Strength & Muscle Tone: within normal limits Gait & Station: normal Patient leans: N/A  Psychiatric Specialty Exam  Presentation  General Appearance:  Appropriate for Environment  Eye Contact: Good  Speech: Clear and Coherent; Normal Rate  Speech Volume: Normal  Handedness: Right   Mood and Affect  Mood: Euthymic  Affect: Congruent   Thought Process  Thought Processes: Coherent  Descriptions of Associations:Intact  Orientation:Full (Time, Place and Person)  Thought Content:Logical  Diagnosis of Schizophrenia or Schizoaffective disorder in past: Yes  Duration of Psychotic Symptoms: No data recorded  Hallucinations:Hallucinations: None  Ideas of Reference:None  Suicidal Thoughts:Suicidal Thoughts: No  Homicidal Thoughts:Homicidal Thoughts: No   Sensorium  Memory: Immediate Fair; Recent Fair; Remote Fair  Judgment: Intact  Insight: Present   Executive Functions  Concentration: Fair  Attention Span: Fair  Recall: [employer] of Knowledge: Fair  Language: Fair   Psychomotor Activity  Psychomotor Activity: Psychomotor Activity: Normal   Assets  Assets: Communication Skills; Desire for Improvement; Financial Resources/Insurance; Leisure Time; Resilience   Sleep  Sleep: Sleep: Good   Nutritional Assessment (For OBS and FBC admissions only) Has the patient had a weight loss or gain of 10 pounds or more in the last 3 months?: No Has the patient had a decrease in food intake/or appetite?: No Does the patient have dental problems?: No Does the patient have eating habits or behaviors that may be indicators of an eating disorder including binging or inducing vomiting?: No Has the patient recently lost weight without trying?: 0 Has the patient been eating poorly because of a decreased appetite?: 0 Malnutrition Screening Tool Score: 0    Physical  Exam  Physical Exam Vitals and nursing note reviewed.  Constitutional:      General: She is not in acute distress.    Appearance: Normal appearance. She is not ill-appearing.  HENT:     Head: Normocephalic.  Eyes:     Conjunctiva/sclera: Conjunctivae normal.  Cardiovascular:     Rate and Rhythm: Normal rate and regular rhythm.  Pulmonary:     Effort: Pulmonary effort is normal.  Musculoskeletal:        General: Normal range of motion.     Cervical back: Normal range of motion.  Skin:    General: Skin is warm and dry.     Coloration: Skin is not jaundiced or pale.  Neurological:     Mental Status: She is alert and oriented to person, place, and time.  Psychiatric:        Attention and Perception: Attention and perception normal. She does not perceive auditory or visual hallucinations.        Mood and Affect: Mood and affect normal.  Speech: Speech normal.        Behavior: Behavior normal. Behavior is cooperative.        Thought Content: Thought content normal. Thought content is not paranoid or delusional. Thought content does not include homicidal or suicidal ideation.        Cognition and Memory: Cognition normal.        Judgment: Judgment normal.    Review of Systems  Constitutional: Negative.   HENT: Negative.    Eyes: Negative.   Respiratory: Negative.    Cardiovascular: Negative.   Musculoskeletal: Negative.   Skin: Negative.   Neurological: Negative.   Psychiatric/Behavioral: Negative.  Depression: Stable. Hallucinations: Denies. Suicidal ideas: Denies. Nervous/anxious: Stable.    Blood pressure 118/62, pulse 70, temperature 97.7 F (36.5 C), temperature source Oral, resp. rate 18, SpO2 99 %. There is no height or weight on file to calculate BMI.  Treatment Plan Summary:  Patient remains psychiatrically cleared. Social work and DSS actively seeking placement.  Meds ordered this encounter  Medications   apixaban (ELIQUIS) tablet 5 mg bid   Vitamin D  (Ergocalciferol) (DRISDOL) 1.25 MG (50000 UNIT) capsule 50,000 Units q7 days   cloZAPine (CLOZARIL) tablet 75 mg qhs   cloZAPine (CLOZARIL) tablet 50 mg qd   divalproex (DEPAKOTE ER) 24 hr tablet 500 mg bid   latanoprost (XALATAN) 0.005 % ophthalmic solution 1 drop qhs   albuterol (VENTOLIN HFA) 108 (90 Base) MCG/ACT inhaler 2 puff prn   haloperidol (HALDOL) tablet 10 mg Q8 hours PRN   nicotine (NICODERM CQ - dosed in mg/24 hr) patch 7 mg daily   fluticasone furoate-vilanterol (BREO ELLIPTA) 200-25 MCG/ACT 1 puff daily   diltiazem (CARDIZEM CD) 24 hr capsule 240 mg daily   ARIPiprazole (ABILIFY) tablet 10 mg daily   valbenazine (INGREZZA) capsule 40 mg bid   insulin aspart (novoLOG) injection 0-9 Units    Order Specific Question:   Correction coverage:    Answer:   Sensitive (thin, NPO, renal)    Order Specific Question:   CBG < 70:    Answer:   Implement Hypoglycemia Standing Orders and refer to Hypoglycemia Standing Orders sidebar report    Order Specific Question:   CBG 70 - 120:    Answer:   0 units    Order Specific Question:   CBG 121 - 150:    Answer:   1 unit    Order Specific Question:   CBG 151 - 200:    Answer:   2 units    Order Specific Question:   CBG 201 - 250:    Answer:   3 units    Order Specific Question:   CBG 251 - 300:    Answer:   5 units    Order Specific Question:   CBG 301 - 350:    Answer:   7 units    Order Specific Question:   CBG 351 - 400    Answer:   9 units    Order Specific Question:   CBG > 400    Answer:   call MD and obtain STAT lab verification   insulin glargine-yfgn (SEMGLEE) injection 8 Units qd   metFORMIN (GLUCOPHAGE-XR) 24 hr tablet 1,000 mg bid   acetaminophen (TYLENOL) tablet 650 mg   alum & mag hydroxide-simeth (MAALOX/MYLANTA) 200-200-20 MG/5ML suspension 30 mL   magnesium hydroxide (MILK OF MAGNESIA) suspension 30 mL   hydrOXYzine (ATARAX) tablet 25 mg   traZODone (DESYREL) tablet 50 mg     Lab  Orders          CBC with  Differential  01/26/2022      CBC with Differential   01/28/2022      CBC with Differential    01/30/2022  Kysha Muralles, NP 01/25/2022 9:54 AM

## 2022-01-25 NOTE — ED Notes (Signed)
Patient A&Ox4. Patient denies SI/HI and AVH. Patient denies any physical complaints when asked. No acute distress noted. Support and encouragement provided. Routine safety checks conducted according to facility protocol. Encouraged patient to notify staff if thoughts of harm toward self or others arise. Patient verbalize understanding and agreement. Will continue to monitor for safety.    

## 2022-01-25 NOTE — Progress Notes (Signed)
Brief Note: [facility]  ANC 11/26 = 3300  Next ANC due 11/28  Thanks Lorenza Evangelist 01/25/2022 9:00 AM

## 2022-01-26 ENCOUNTER — Encounter (HOSPITAL_COMMUNITY): Payer: Self-pay | Admitting: Registered Nurse

## 2022-01-26 DIAGNOSIS — F209 Schizophrenia, unspecified: Secondary | ICD-10-CM | POA: Diagnosis not present

## 2022-01-26 LAB — CBC WITH DIFFERENTIAL/PLATELET
Abs Immature Granulocytes: 0.02 10*3/uL (ref 0.00–0.07)
Basophils Absolute: 0.1 10*3/uL (ref 0.0–0.1)
Basophils Relative: 1 %
Eosinophils Absolute: 0.1 10*3/uL (ref 0.0–0.5)
Eosinophils Relative: 1 %
HCT: 36.4 % (ref 36.0–46.0)
Hemoglobin: 11.3 g/dL — ABNORMAL LOW (ref 12.0–15.0)
Immature Granulocytes: 0 %
Lymphocytes Relative: 31 %
Lymphs Abs: 2 10*3/uL (ref 0.7–4.0)
MCH: 24.9 pg — ABNORMAL LOW (ref 26.0–34.0)
MCHC: 31 g/dL (ref 30.0–36.0)
MCV: 80.4 fL (ref 80.0–100.0)
Monocytes Absolute: 0.5 10*3/uL (ref 0.1–1.0)
Monocytes Relative: 7 %
Neutro Abs: 3.9 10*3/uL (ref 1.7–7.7)
Neutrophils Relative %: 60 %
Platelets: 327 10*3/uL (ref 150–400)
RBC: 4.53 MIL/uL (ref 3.87–5.11)
RDW: 17.3 % — ABNORMAL HIGH (ref 11.5–15.5)
WBC: 6.5 10*3/uL (ref 4.0–10.5)
nRBC: 0 % (ref 0.0–0.2)

## 2022-01-26 LAB — GLUCOSE, CAPILLARY
Glucose-Capillary: 149 mg/dL — ABNORMAL HIGH (ref 70–99)
Glucose-Capillary: 130 mg/dL — ABNORMAL HIGH (ref 70–99)
Glucose-Capillary: 151 mg/dL — ABNORMAL HIGH (ref 70–99)
Glucose-Capillary: 170 mg/dL — ABNORMAL HIGH (ref 70–99)

## 2022-01-26 NOTE — Progress Notes (Signed)
Brief Note: [facility]  ANC 11/28= 3900  Next ANC due 12/5  Thanks Lorenza Evangelist 01/26/2022 3:15 PM

## 2022-01-26 NOTE — ED Provider Notes (Signed)
Behavioral Health Progress Note  Date and Time: 01/26/2022 10:28 AM Name: Teresa Murillo MRN:  [id number]  Subjective:  Teresa Murillo 61 y.o., female patient with a past psychiatric history of schizophrenia, aggressive behavior, and borderline intellectual disability who initially presented to [facility] on 11/23/2021 via [facility].  At that time patient had been dismissed from the group home due to multiple elopements.  Patient has remained at the [facility] while awaiting new placement.  Social work and DSS are involved.  Patient was complaining of body aches and chest pain on 01/23/2022 she was sent to the [facility] ED to be medically evaluated.  Patient was treated and medically cleared to return to [facility].  Patient continue to be psychiatrically cleared awaiting appropriate placement.  Social work and DSS continue to seek placement.  Teresa Murillo, 61 y.o., female patient seen face to face by this provider, consulted with Dr. Hampton Abbot, and chart reviewed on 01/26/22. Patient continues to deny suicidal/self-harm/homicidal ideation, psychosis, and paranoia.  States she is tolerating medications without adverse reaction.  States she is eating and sleeping without difficulty  Patient asked what is going on with her placement.  States no one has spoken to her about what is going on.  States she is tired of being in room.    During evaluation Teresa Murillo is lying in bed with no noted distress.  She denies any medical complaints at this time including chest pain.  She is alert/oriented x 4, calm, cooperative, and attentive.  She denies depression, anxiety, suicidal/self-harm/homicidal ideation,psychosis, ad paranoia.  Her mood is euthymic wit congruent affect.  She has normal speech and behavior.  She is able to converse coherently with goal-directed thoughts, no distractibility or internal preoccupation.  Spoke with Education officer, museum who informs that DSS APS dropped the process for guardianship.  States that patient  has been accepted to a home but they are waiting on extra funding but unsure what the hold up is or how long it will take.  Social work will contact toe appropriate people to see what is going on and how long it should take for things to happen.  Patient continues to be her own guardian but is unable to make appropriate decisions to live on her own and care for herself.    Patient continues to remain psychiatrically cleared.  She remains boarding on the unit while social work and DSS is seek appropriate placement.    Diagnosis:  Final diagnoses:  Schizophrenia, unspecified type ([place])  At risk for self care deficit  Noncompliance  Type 2 diabetes mellitus without complication, unspecified whether long term insulin use ([place])    Total Time spent with patient: 15 minutes  Past Psychiatric History:  Past Medical History:  Past Medical History:  Diagnosis Date   Borderline intellectual functioning 12/14/2021   On 12/14/2021: Appreciate assistance from psychology consult. On the Wechsler Adult Intelligence Scale-4, Ms. Zemanek achieved a full-scale IQ score of 73 and a percentile rank of 4 placing her in the borderline range of intellectual functioning.    Chronic obstructive pulmonary disease (COPD) (HCC)    Glaucoma    Hyperlipidemia    Hypertension    Iron deficiency    Schizoaffective disorder (HCC)    Type 2 diabetes mellitus (HCC)     Past Surgical History:  Procedure Laterality Date   TUBAL LIGATION     Family History:  Family History  Problem Relation Age of Onset   Breast cancer Maternal Grandmother  Family Psychiatric  History: schizophrenia, aggressive behavior, and borderline intellectual disability Social History:  Social History   Substance and Sexual Activity  Alcohol Use Yes     Social History   Substance and Sexual Activity  Drug Use Not Currently    Social History   Socioeconomic History   Marital status: Divorced    Spouse name: Not on file    Number of children: Not on file   Years of education: Not on file   Highest education level: Not on file  Occupational History   Not on file  Tobacco Use   Smoking status: Every Day    Packs/day: 1.00    Types: Cigars, Cigarettes   Smokeless tobacco: Current  Vaping Use   Vaping Use: Never used  Substance and Sexual Activity   Alcohol use: Yes   Drug use: Not Currently   Sexual activity: Not Currently    Birth control/protection: Surgical  Other Topics Concern   Not on file  Social History Narrative   Not on file   Social Determinants of Health   Financial Resource Strain: Not on file  Food Insecurity: Not on file  Transportation Needs: Not on file  Physical Activity: Not on file  Stress: Not on file  Social Connections: Not on file   SDOH:  SDOH Screenings   Depression (PHQ2-9): Low Risk  (11/23/2021)  Tobacco Use: High Risk (01/26/2022)   Additional Social History:    Sleep: Good  Appetite:  Good  Current Medications:  Current Facility-Administered Medications  Medication Dose Route Frequency Provider Last Rate Last Admin   acetaminophen (TYLENOL) tablet 650 mg  650 mg Oral Q6H PRN Revonda Humphrey, NP   650 mg at 01/25/22 2138   albuterol (VENTOLIN HFA) 108 (90 Base) MCG/ACT inhaler 2 puff  2 puff Inhalation Q6H PRN Revonda Humphrey, NP       alum & mag hydroxide-simeth (MAALOX/MYLANTA) 200-200-20 MG/5ML suspension 30 mL  30 mL Oral Q4H PRN Revonda Humphrey, NP       apixaban Arne Cleveland) tablet 5 mg  5 mg Oral BID Revonda Humphrey, NP   5 mg at 01/26/22 0919   ARIPiprazole (ABILIFY) tablet 10 mg  10 mg Oral Daily Revonda Humphrey, NP   10 mg at 01/26/22 0919   cloZAPine (CLOZARIL) tablet 50 mg  50 mg Oral Daily Revonda Humphrey, NP   50 mg at 01/26/22 0918   cloZAPine (CLOZARIL) tablet 75 mg  75 mg Oral QHS Revonda Humphrey, NP   75 mg at 01/25/22 2138   diltiazem (CARDIZEM CD) 24 hr capsule 240 mg  240 mg Oral Daily Revonda Humphrey, NP   240 mg  at 01/26/22 0919   divalproex (DEPAKOTE ER) 24 hr tablet 500 mg  500 mg Oral BID Revonda Humphrey, NP   500 mg at 01/26/22 0919   fluticasone furoate-vilanterol (BREO ELLIPTA) 200-25 MCG/ACT 1 puff  1 puff Inhalation Daily Revonda Humphrey, NP   1 puff at 01/26/22 0804   haloperidol (HALDOL) tablet 10 mg  10 mg Oral TID PRN Revonda Humphrey, NP       hydrOXYzine (ATARAX) tablet 25 mg  25 mg Oral TID PRN Revonda Humphrey, NP       insulin aspart (novoLOG) injection 0-9 Units  0-9 Units Subcutaneous TID WC Revonda Humphrey, NP   1 Units at 01/26/22 0804   insulin glargine-yfgn (SEMGLEE) injection 8 Units  8 Units Subcutaneous Q1200 Teresa Murillo,  Teresa Dakin, NP   8 Units at 01/25/22 1216   latanoprost (XALATAN) 0.005 % ophthalmic solution 1 drop  1 drop Both Eyes QHS Revonda Humphrey, NP   1 drop at 01/25/22 2139   magnesium hydroxide (MILK OF MAGNESIA) suspension 30 mL  30 mL Oral Daily PRN Revonda Humphrey, NP       metFORMIN (GLUCOPHAGE-XR) 24 hr tablet 1,000 mg  1,000 mg Oral BID WC Revonda Humphrey, NP   1,000 mg at 01/26/22 0804   nicotine (NICODERM CQ - dosed in mg/24 hr) patch 7 mg  7 mg Transdermal Daily Revonda Humphrey, NP   7 mg at 01/26/22 1000   traZODone (DESYREL) tablet 50 mg  50 mg Oral QHS PRN Revonda Humphrey, NP       valbenazine [facility]) capsule 40 mg  40 mg Oral q AM Revonda Humphrey, NP   40 mg at 01/26/22 0634   [START ON 01/28/2022] Vitamin D (Ergocalciferol) (DRISDOL) 1.25 MG (50000 UNIT) capsule 50,000 Units  50,000 Units Oral Q Sharren Bridge, NP       Current Outpatient Medications  Medication Sig Dispense Refill   Accu-Chek Softclix Lancets lancets Use as directed up to four times daily 100 each 0   apixaban (ELIQUIS) 5 MG TABS tablet Take 1 tablet (5 mg total) by mouth 2 (two) times daily. 60 tablet 0   ARIPiprazole (ABILIFY) 10 MG tablet Take 1 tablet (10 mg total) by mouth daily. 30 tablet 0   Blood Glucose Monitoring Suppl (ACCU-CHEK GUIDE)  w/Device KIT Use as directed up to four times daily 1 kit 0   budesonide-formoterol (SYMBICORT) 160-4.5 MCG/ACT inhaler Inhale 2 puffs into the lungs in the morning and at bedtime.     Cholecalciferol (VITAMIN D3) 1.25 MG (50000 UT) CAPS Take 50,000 Units by mouth every Thursday.     cloZAPine (CLOZARIL) 25 MG tablet Take 3 tablets (75 mg total) by mouth at bedtime. 90 tablet 0   clozapine (CLOZARIL) 50 MG tablet Take 1 tablet (50 mg total) by mouth daily. 30 tablet 0   diltiazem (CARDIZEM CD) 240 MG 24 hr capsule Take 1 capsule (240 mg total) by mouth daily. (Patient not taking: Reported on 11/24/2021) 30 capsule 0   divalproex (DEPAKOTE ER) 500 MG 24 hr tablet Take 1 tablet (500 mg total) by mouth 2 (two) times daily. 60 tablet 0   glucose blood test strip Use as directed up to four times daily 50 each 0   haloperidol (HALDOL) 10 MG tablet Take 1 tablet (10 mg total) by mouth 3 (three) times daily as needed for agitation (and psychotic symptoms).     INGREZZA 40 MG capsule Take 1 capsule (40 mg total) by mouth in the morning. 30 capsule 0   insulin aspart (NOVOLOG) 100 UNIT/ML FlexPen Before each meal 3 times a day, 140-199 - 2 units, 200-250 - 4 units, 251-299 - 6 units,  300-349 - 8 units,  350 or above 10 units. Insulin PEN if approved, provide syringes and needles if needed.Please switch to any approved short acting Insulin if needed. 15 mL 0   insulin glargine (LANTUS) 100 UNIT/ML Solostar Pen Inject 12 Units into the skin daily. 15 mL 0   Insulin Pen Needle 32G X 4 MM MISC Use 4 times a day with insulin, 1 month supply. 100 each 0   latanoprost (XALATAN) 0.005 % ophthalmic solution Place 1 drop into both eyes at bedtime.  metFORMIN (GLUCOPHAGE) 500 MG tablet Take 500 mg by mouth 2 (two) times daily with a meal.     nicotine (NICODERM CQ - DOSED IN MG/24 HR) 7 mg/24hr patch Place 1 patch (7 mg total) onto the skin daily. 28 patch 0   PROAIR HFA 108 (90 Base) MCG/ACT inhaler Inhale 2 puffs  into the lungs every 6 (six) hours as needed for wheezing or shortness of breath.      Labs  Lab Results:  Admission on 01/24/2022  Component Date Value Ref Range Status   Glucose-Capillary 01/24/2022 143 (H)  70 - 99 mg/dL Final   Glucose reference range applies only to samples taken after fasting for at least 8 hours.   Glucose-Capillary 01/24/2022 120 (H)  70 - 99 mg/dL Final   Glucose reference range applies only to samples taken after fasting for at least 8 hours.   Glucose-Capillary 01/25/2022 122 (H)  70 - 99 mg/dL Final   Glucose reference range applies only to samples taken after fasting for at least 8 hours.   Glucose-Capillary 01/25/2022 190 (H)  70 - 99 mg/dL Final   Glucose reference range applies only to samples taken after fasting for at least 8 hours.   Glucose-Capillary 01/25/2022 163 (H)  70 - 99 mg/dL Final   Glucose reference range applies only to samples taken after fasting for at least 8 hours.   Glucose-Capillary 01/26/2022 149 (H)  70 - 99 mg/dL Final   Glucose reference range applies only to samples taken after fasting for at least 8 hours.  Admission on 01/23/2022, Discharged on 01/24/2022  Component Date Value Ref Range Status   Sodium 01/23/2022 140  135 - 145 mmol/L Final   Potassium 01/23/2022 4.4  3.5 - 5.1 mmol/L Final   Chloride 01/23/2022 101  98 - 111 mmol/L Final   CO2 01/23/2022 25  22 - 32 mmol/L Final   Glucose, Bld 01/23/2022 198 (H)  70 - 99 mg/dL Final   Glucose reference range applies only to samples taken after fasting for at least 8 hours.   BUN 01/23/2022 13  6 - 20 mg/dL Final   Creatinine, Ser 01/23/2022 0.62  0.44 - 1.00 mg/dL Final   Calcium 01/23/2022 9.3  8.9 - 10.3 mg/dL Final   GFR, Estimated 01/23/2022 >60  >60 mL/min Final   Comment: (NOTE) Calculated using the CKD-EPI Creatinine Equation (2021)    Anion gap 01/23/2022 14  5 - 15 Final   Performed at [facility], [place] [address] [place], [place] [address]   WBC  01/23/2022 5.8  4.0 - 10.5 K/uL Final   RBC 01/23/2022 4.63  3.87 - 5.11 MIL/uL Final   Hemoglobin 01/23/2022 11.7 (L)  12.0 - 15.0 g/dL Final   HCT 01/23/2022 37.4  36.0 - 46.0 % Final   MCV 01/23/2022 80.8  80.0 - 100.0 fL Final   MCH 01/23/2022 25.3 (L)  26.0 - 34.0 pg Final   MCHC 01/23/2022 31.3  30.0 - 36.0 g/dL Final   RDW 01/23/2022 17.9 (H)  11.5 - 15.5 % Final   Platelets 01/23/2022 333  150 - 400 K/uL Final   nRBC 01/23/2022 0.0  0.0 - 0.2 % Final   Performed at [place] [address], [place], [place] [address]   Troponin I (High Sensitivity) 01/23/2022 6  <18 ng/L Final   Comment: (NOTE) Elevated high sensitivity troponin I (hsTnI) values and significant  changes across serial measurements may suggest ACS but  many other  chronic and acute conditions are known to elevate hsTnI results.  Refer to the "Links" section for chest pain algorithms and additional  guidance. Performed at [facility], [place] [address], [place], [place] [address]    Troponin I (High Sensitivity) 01/23/2022 5  <18 ng/L Final   Comment: (NOTE) Elevated high sensitivity troponin I (hsTnI) values and significant  changes across serial measurements may suggest ACS but many other  chronic and acute conditions are known to elevate hsTnI results.  Refer to the "Links" section for chest pain algorithms and additional  guidance. Performed at [facility], [place] [address] [place], [place] [address]    SARS Coronavirus 2 by RT PCR 01/23/2022 NEGATIVE  NEGATIVE Final   Comment: (NOTE) SARS-CoV-2 target nucleic acids are NOT DETECTED.  The SARS-CoV-2 RNA is generally detectable in upper and lower respiratory specimens during the acute phase of infection. The lowest concentration of SARS-CoV-2 viral copies this assay can detect is 250 copies / mL. A negative result does not preclude SARS-CoV-2 infection and should not be used as the sole basis for treatment or other patient management  decisions.  A negative result may occur with improper specimen collection / handling, submission of specimen other than nasopharyngeal swab, presence of viral mutation(s) within the areas targeted by this assay, and inadequate number of viral copies (<250 copies / mL). A negative result must be combined with clinical observations, patient history, and epidemiological information.  Fact Sheet for Patients:   https://www.patel.info/  Fact Sheet for Healthcare Providers: https://hall.com/  This test is not yet approved or                           cleared by the [place] FDA and has been authorized for detection and/or diagnosis of SARS-CoV-2 by FDA under an Emergency Use Authorization (EUA).  This EUA will remain in effect (meaning this test can be used) for the duration of the COVID-19 declaration under Section 564(b)(1) of the Act, 21 U.S.C. section 360bbb-3(b)(1), unless the authorization is terminated or revoked sooner.  Performed at [facility], [place] [address] [place], [place] [address]    B Natriuretic Peptide 01/23/2022 41.3  0.0 - 100.0 pg/mL Final   Performed at [facility] [address] [place], [place] [address]   Group A Strep by PCR 01/23/2022 NOT DETECTED  NOT DETECTED Final   Performed at [facility], [place] [address] [place], [place] [address]   Color, Urine 01/23/2022 YELLOW  YELLOW Final   APPearance 01/23/2022 CLEAR  CLEAR Final   Specific Gravity, Urine 01/23/2022 1.018  1.005 - 1.030 Final   pH 01/23/2022 7.0  5.0 - 8.0 Final   Glucose, UA 01/23/2022 NEGATIVE  NEGATIVE mg/dL Final   Hgb urine dipstick 01/23/2022 NEGATIVE  NEGATIVE Final   Bilirubin Urine 01/23/2022 NEGATIVE  NEGATIVE Final   Ketones, ur 01/23/2022 NEGATIVE  NEGATIVE mg/dL Final   Protein, ur 01/23/2022 NEGATIVE  NEGATIVE mg/dL Final   Nitrite 01/23/2022 NEGATIVE  NEGATIVE Final   Leukocytes,Ua 01/23/2022 TRACE (A)   NEGATIVE Final   RBC / HPF 01/23/2022 0-5  0 - 5 RBC/hpf Final   WBC, UA 01/23/2022 0-5  0 - 5 WBC/hpf Final   Bacteria, UA 01/23/2022 NONE SEEN  NONE SEEN Final   Squamous Epithelial / LPF 01/23/2022 0-5  0 - 5 Final   Mucus 01/23/2022 PRESENT   Final  Performed at [facility], [place] [address], [place],  [address]   SARS Coronavirus 2 by RT PCR 01/23/2022 NEGATIVE  NEGATIVE Final   Comment: (NOTE) SARS-CoV-2 target nucleic acids are NOT DETECTED.  The SARS-CoV-2 RNA is generally detectable in upper respiratory specimens during the acute phase of infection. The lowest concentration of SARS-CoV-2 viral copies this assay can detect is 138 copies/mL. A negative result does not preclude SARS-Cov-2 infection and should not be used as the sole basis for treatment or other patient management decisions. A negative result may occur with  improper specimen collection/handling, submission of specimen other than nasopharyngeal swab, presence of viral mutation(s) within the areas targeted by this assay, and inadequate number of viral copies(<138 copies/mL). A negative result must be combined with clinical observations, patient history, and epidemiological information. The expected result is Negative.  Fact Sheet for Patients:  EntrepreneurPulse.com.au  Fact Sheet for Healthcare Providers:  IncredibleEmployment.be  This test is no                          t yet approved or cleared by the [place] FDA and  has been authorized for detection and/or diagnosis of SARS-CoV-2 by FDA under an Emergency Use Authorization (EUA). This EUA will remain  in effect (meaning this test can be used) for the duration of the COVID-19 declaration under Section 564(b)(1) of the Act, 21 U.S.C.section 360bbb-3(b)(1), unless the authorization is terminated  or revoked sooner.       Influenza A by PCR 01/23/2022 NEGATIVE  NEGATIVE Final   Influenza B by PCR  01/23/2022 NEGATIVE  NEGATIVE Final   Comment: (NOTE) The Xpert Xpress SARS-CoV-2/FLU/RSV plus assay is intended as an aid in the diagnosis of influenza from Nasopharyngeal swab specimens and should not be used as a sole basis for treatment. Nasal washings and aspirates are unacceptable for Xpert Xpress SARS-CoV-2/FLU/RSV testing.  Fact Sheet for Patients: EntrepreneurPulse.com.au  Fact Sheet for Healthcare Providers: IncredibleEmployment.be  This test is not yet approved or cleared by the [place] FDA and has been authorized for detection and/or diagnosis of SARS-CoV-2 by FDA under an Emergency Use Authorization (EUA). This EUA will remain in effect (meaning this test can be used) for the duration of the COVID-19 declaration under Section 564(b)(1) of the Act, 21 U.S.C. section 360bbb-3(b)(1), unless the authorization is terminated or revoked.  Performed at [facility], [place] [address], [place], [place] [address]    WBC 01/24/2022 6.1  4.0 - 10.5 K/uL Final   RBC 01/24/2022 4.60  3.87 - 5.11 MIL/uL Final   Hemoglobin 01/24/2022 11.7 (L)  12.0 - 15.0 g/dL Final   HCT 01/24/2022 37.0  36.0 - 46.0 % Final   MCV 01/24/2022 80.4  80.0 - 100.0 fL Final   MCH 01/24/2022 25.4 (L)  26.0 - 34.0 pg Final   MCHC 01/24/2022 31.6  30.0 - 36.0 g/dL Final   RDW 01/24/2022 17.6 (H)  11.5 - 15.5 % Final   Platelets 01/24/2022 334  150 - 400 K/uL Final   nRBC 01/24/2022 0.0  0.0 - 0.2 % Final   Neutrophils Relative % 01/24/2022 53  % Final   Neutro Abs 01/24/2022 3.3  1.7 - 7.7 K/uL Final   Lymphocytes Relative 01/24/2022 33  % Final   Lymphs Abs 01/24/2022 2.0  0.7 - 4.0 K/uL Final   Monocytes Relative 01/24/2022 11  % Final   Monocytes Absolute 01/24/2022 0.7  0.1 -  1.0 K/uL Final   Eosinophils Relative 01/24/2022 2  % Final   Eosinophils Absolute 01/24/2022 0.1  0.0 - 0.5 K/uL Final   Basophils Relative 01/24/2022 1  % Final   Basophils  Absolute 01/24/2022 0.1  0.0 - 0.1 K/uL Final   Immature Granulocytes 01/24/2022 0  % Final   Abs Immature Granulocytes 01/24/2022 0.02  0.00 - 0.07 K/uL Final   Performed at [facility], [place] [address], [place], [place] [address]   Glucose-Capillary 01/24/2022 110 (H)  70 - 99 mg/dL Final   Glucose reference range applies only to samples taken after fasting for at least 8 hours.  No results displayed because visit has over 200 results.    Admission on 11/22/2021, Discharged on 11/22/2021  Component Date Value Ref Range Status   Glucose-Capillary 11/22/2021 169 (H)  70 - 99 mg/dL Final   Glucose reference range applies only to samples taken after fasting for at least 8 hours.  No results displayed because visit has over 200 results.    Admission on 10/04/2021, Discharged on 10/22/2021  Component Date Value Ref Range Status   SARS Coronavirus 2 by RT PCR 10/04/2021 NEGATIVE  NEGATIVE Final   Comment: (NOTE) SARS-CoV-2 target nucleic acids are NOT DETECTED.  The SARS-CoV-2 RNA is generally detectable in upper respiratory specimens during the acute phase of infection. The lowest concentration of SARS-CoV-2 viral copies this assay can detect is 138 copies/mL. A negative result does not preclude SARS-Cov-2 infection and should not be used as the sole basis for treatment or other patient management decisions. A negative result may occur with  improper specimen collection/handling, submission of specimen other than nasopharyngeal swab, presence of viral mutation(s) within the areas targeted by this assay, and inadequate number of viral copies(<138 copies/mL). A negative result must be combined with clinical observations, patient history, and epidemiological information. The expected result is Negative.  Fact Sheet for Patients:  EntrepreneurPulse.com.au  Fact Sheet for Healthcare Providers:  IncredibleEmployment.be  This test is no                           t yet approved or cleared by the [place] FDA and  has been authorized for detection and/or diagnosis of SARS-CoV-2 by FDA under an Emergency Use Authorization (EUA). This EUA will remain  in effect (meaning this test can be used) for the duration of the COVID-19 declaration under Section 564(b)(1) of the Act, 21 U.S.C.section 360bbb-3(b)(1), unless the authorization is terminated  or revoked sooner.       Influenza A by PCR 10/04/2021 NEGATIVE  NEGATIVE Final   Influenza B by PCR 10/04/2021 NEGATIVE  NEGATIVE Final   Comment: (NOTE) The Xpert Xpress SARS-CoV-2/FLU/RSV plus assay is intended as an aid in the diagnosis of influenza from Nasopharyngeal swab specimens and should not be used as a sole basis for treatment. Nasal washings and aspirates are unacceptable for Xpert Xpress SARS-CoV-2/FLU/RSV testing.  Fact Sheet for Patients: EntrepreneurPulse.com.au  Fact Sheet for Healthcare Providers: IncredibleEmployment.be  This test is not yet approved or cleared by the [place] FDA and has been authorized for detection and/or diagnosis of SARS-CoV-2 by FDA under an Emergency Use Authorization (EUA). This EUA will remain in effect (meaning this test can be used) for the duration of the COVID-19 declaration under Section 564(b)(1) of the Act, 21 U.S.C. section 360bbb-3(b)(1), unless the authorization is terminated or revoked.  Performed at [facility], [address] [place], [place] [address]    WBC 10/04/2021 8.4  4.0 - 10.5 K/uL Final   RBC 10/04/2021 4.42  3.87 - 5.11 MIL/uL Final   Hemoglobin 10/04/2021 11.6 (L)  12.0 - 15.0 g/dL Final   HCT 10/04/2021 35.7 (L)  36.0 - 46.0 % Final   MCV 10/04/2021 80.8  80.0 - 100.0 fL Final   MCH 10/04/2021 26.2  26.0 - 34.0 pg Final   MCHC 10/04/2021 32.5  30.0 - 36.0 g/dL Final   RDW 10/04/2021 16.0 (H)  11.5 - 15.5 % Final   Platelets 10/04/2021 372  150 - 400 K/uL  Final   nRBC 10/04/2021 0.0  0.0 - 0.2 % Final   Neutrophils Relative % 10/04/2021 68  % Final   Neutro Abs 10/04/2021 5.7  1.7 - 7.7 K/uL Final   Lymphocytes Relative 10/04/2021 22  % Final   Lymphs Abs 10/04/2021 1.8  0.7 - 4.0 K/uL Final   Monocytes Relative 10/04/2021 8  % Final   Monocytes Absolute 10/04/2021 0.7  0.1 - 1.0 K/uL Final   Eosinophils Relative 10/04/2021 1  % Final   Eosinophils Absolute 10/04/2021 0.1  0.0 - 0.5 K/uL Final   Basophils Relative 10/04/2021 1  % Final   Basophils Absolute 10/04/2021 0.1  0.0 - 0.1 K/uL Final   Immature Granulocytes 10/04/2021 0  % Final   Abs Immature Granulocytes 10/04/2021 0.03  0.00 - 0.07 K/uL Final   Performed at [facility], [place] [address], [place], [place] [address]   Sodium 10/04/2021 136  135 - 145 mmol/L Final   Potassium 10/04/2021 4.2  3.5 - 5.1 mmol/L Final   Chloride 10/04/2021 104  98 - 111 mmol/L Final   CO2 10/04/2021 25  22 - 32 mmol/L Final   Glucose, Bld 10/04/2021 100 (H)  70 - 99 mg/dL Final   Glucose reference range applies only to samples taken after fasting for at least 8 hours.   BUN 10/04/2021 9  6 - 20 mg/dL Final   Creatinine, Ser 10/04/2021 0.52  0.44 - 1.00 mg/dL Final   Calcium 10/04/2021 9.0  8.9 - 10.3 mg/dL Final   Total Protein 10/04/2021 7.0  6.5 - 8.1 g/dL Final   Albumin 10/04/2021 3.2 (L)  3.5 - 5.0 g/dL Final   AST 10/04/2021 13 (L)  15 - 41 U/L Final   ALT 10/04/2021 10  0 - 44 U/L Final   Alkaline Phosphatase 10/04/2021 61  38 - 126 U/L Final   Total Bilirubin 10/04/2021 0.3  0.3 - 1.2 mg/dL Final   GFR, Estimated 10/04/2021 >60  >60 mL/min Final   Comment: (NOTE) Calculated using the CKD-EPI Creatinine Equation (2021)    Anion gap 10/04/2021 7  5 - 15 Final   Performed at [facility] [address], [place], [place] [address]   Hgb A1c MFr Bld 10/04/2021 7.0 (H)  4.8 - 5.6 % Final   Comment: (NOTE) Pre diabetes:          5.7%-6.4%  Diabetes:               >6.4%  Glycemic control for   <7.0% adults with diabetes    Mean Plasma Glucose 10/04/2021 154.2  mg/dL Final   Performed at [facility], [place] [address] [place], [place] [address]   Cholesterol 10/04/2021 178  0 - 200 mg/dL Final   Triglycerides 10/04/2021 155 (H)  <150 mg/dL Final   HDL 10/04/2021  52  >40 mg/dL Final   Total CHOL/HDL Ratio 10/04/2021 3.4  RATIO Final   VLDL 10/04/2021 31  0 - 40 mg/dL Final   LDL Cholesterol 10/04/2021 95  0 - 99 mg/dL Final   Comment:        Total Cholesterol/HDL:CHD Risk Coronary Heart Disease Risk Table                     Men   Women  1/2 Average Risk   3.4   3.3  Average Risk       5.0   4.4  2 X Average Risk   9.6   7.1  3 X Average Risk  23.4   11.0        Use the calculated Patient Ratio above and the CHD Risk Table to determine the patient's CHD Risk.        ATP III CLASSIFICATION (LDL):  <100     mg/dL   Optimal  100-129  mg/dL   Near or Above                    Optimal  130-159  mg/dL   Borderline  160-189  mg/dL   High  >190     mg/dL   Very High Performed at [facility] [address] [place], [place] [address]    POC Amphetamine UR 10/04/2021 None Detected  NONE DETECTED (Cut Off Level 1000 ng/mL) Final   POC Secobarbital (BAR) 10/04/2021 None Detected  NONE DETECTED (Cut Off Level 300 ng/mL) Final   POC Buprenorphine (BUP) 10/04/2021 None Detected  NONE DETECTED (Cut Off Level 10 ng/mL) Final   POC Oxazepam (BZO) 10/04/2021 None Detected  NONE DETECTED (Cut Off Level 300 ng/mL) Final   POC Cocaine UR 10/04/2021 None Detected  NONE DETECTED (Cut Off Level 300 ng/mL) Final   POC Methamphetamine UR 10/04/2021 None Detected  NONE DETECTED (Cut Off Level 1000 ng/mL) Final   POC Morphine 10/04/2021 None Detected  NONE DETECTED (Cut Off Level 300 ng/mL) Final   POC Methadone UR 10/04/2021 None Detected  NONE DETECTED (Cut Off Level 300 ng/mL) Final   POC Oxycodone UR 10/04/2021 Positive (A)  NONE DETECTED (Cut Off  Level 100 ng/mL) Final   POC Marijuana UR 10/04/2021 None Detected  NONE DETECTED (Cut Off Level 50 ng/mL) Final   SARSCOV2ONAVIRUS 2 AG 10/04/2021 NEGATIVE  NEGATIVE Final   Comment: (NOTE) SARS-CoV-2 antigen NOT DETECTED.   Negative results are presumptive.  Negative results do not preclude SARS-CoV-2 infection and should not be used as the sole basis for treatment or other patient management decisions, including infection  control decisions, particularly in the presence of clinical signs and  symptoms consistent with COVID-19, or in those who have been in contact with the virus.  Negative results must be combined with clinical observations, patient history, and epidemiological information. The expected result is Negative.  Fact Sheet for Patients: HandmadeRecipes.com.cy  Fact Sheet for Healthcare Providers: FuneralLife.at  This test is not yet approved or cleared by the [place] FDA and  has been authorized for detection and/or diagnosis of SARS-CoV-2 by FDA under an Emergency Use Authorization (EUA).  This EUA will remain in effect (meaning this test can be used) for the duration of  the COV                          ID-19 declaration under Section 564(b)(1) of  the Act, 21 U.S.C. section 360bbb-3(b)(1), unless the authorization is terminated or revoked sooner.     Valproic Acid Lvl 10/04/2021 51  50.0 - 100.0 ug/mL Final   Performed at [facility] [address] [place], [place] [address]   Valproic Acid Lvl 10/08/2021 57  50.0 - 100.0 ug/mL Final   Performed at [facility] [address], [place], [place] [address]   Glucose-Capillary 10/09/2021 123 (H)  70 - 99 mg/dL Final   Glucose reference range applies only to samples taken after fasting for at least 8 hours.  Admission on 09/09/2021, Discharged on 09/10/2021  Component Date Value Ref Range Status   Sodium 09/09/2021 134 (L)  135 - 145 mmol/L Final    Potassium 09/09/2021 4.3  3.5 - 5.1 mmol/L Final   Chloride 09/09/2021 99  98 - 111 mmol/L Final   CO2 09/09/2021 25  22 - 32 mmol/L Final   Glucose, Bld 09/09/2021 107 (H)  70 - 99 mg/dL Final   Glucose reference range applies only to samples taken after fasting for at least 8 hours.   BUN 09/09/2021 12  6 - 20 mg/dL Final   Creatinine, Ser 09/09/2021 0.74  0.44 - 1.00 mg/dL Final   Calcium 09/09/2021 9.0  8.9 - 10.3 mg/dL Final   Total Protein 09/09/2021 7.4  6.5 - 8.1 g/dL Final   Albumin 09/09/2021 3.1 (L)  3.5 - 5.0 g/dL Final   AST 09/09/2021 13 (L)  15 - 41 U/L Final   ALT 09/09/2021 13  0 - 44 U/L Final   Alkaline Phosphatase 09/09/2021 66  38 - 126 U/L Final   Total Bilirubin 09/09/2021 0.2 (L)  0.3 - 1.2 mg/dL Final   GFR, Estimated 09/09/2021 >60  >60 mL/min Final   Comment: (NOTE) Calculated using the CKD-EPI Creatinine Equation (2021)    Anion gap 09/09/2021 10  5 - 15 Final   Performed at [facility], [place] [address] [place], [place] [address]   WBC 09/09/2021 8.5  4.0 - 10.5 K/uL Final   RBC 09/09/2021 4.53  3.87 - 5.11 MIL/uL Final   Hemoglobin 09/09/2021 11.8 (L)  12.0 - 15.0 g/dL Final   HCT 09/09/2021 38.0  36.0 - 46.0 % Final   MCV 09/09/2021 83.9  80.0 - 100.0 fL Final   MCH 09/09/2021 26.0  26.0 - 34.0 pg Final   MCHC 09/09/2021 31.1  30.0 - 36.0 g/dL Final   RDW 09/09/2021 17.8 (H)  11.5 - 15.5 % Final   Platelets 09/09/2021 442 (H)  150 - 400 K/uL Final   nRBC 09/09/2021 0.0  0.0 - 0.2 % Final   Neutrophils Relative % 09/09/2021 70  % Final   Neutro Abs 09/09/2021 5.9  1.7 - 7.7 K/uL Final   Lymphocytes Relative 09/09/2021 20  % Final   Lymphs Abs 09/09/2021 1.7  0.7 - 4.0 K/uL Final   Monocytes Relative 09/09/2021 8  % Final   Monocytes Absolute 09/09/2021 0.7  0.1 - 1.0 K/uL Final   Eosinophils Relative 09/09/2021 1  % Final   Eosinophils Absolute 09/09/2021 0.1  0.0 - 0.5 K/uL Final   Basophils Relative 09/09/2021 1  % Final   Basophils  Absolute 09/09/2021 0.1  0.0 - 0.1 K/uL Final   Immature Granulocytes 09/09/2021 0  % Final   Abs Immature Granulocytes 09/09/2021 0.03  0.00 - 0.07 K/uL Final   Performed at [facility], [place],  [place] [address]   Ammonia 09/09/2021 37 (H)  9 - 35 umol/L Final   Comment: HEMOLYSIS AT THIS LEVEL MAY AFFECT RESULT Performed at [facility], [place] [address], [place], [place] [address]    Opiates 09/09/2021 NONE DETECTED  NONE DETECTED Final   Cocaine 09/09/2021 NONE DETECTED  NONE DETECTED Final   Benzodiazepines 09/09/2021 NONE DETECTED  NONE DETECTED Final   Amphetamines 09/09/2021 NONE DETECTED  NONE DETECTED Final   Tetrahydrocannabinol 09/09/2021 NONE DETECTED  NONE DETECTED Final   Barbiturates 09/09/2021 NONE DETECTED  NONE DETECTED Final   Comment: (NOTE) DRUG SCREEN FOR MEDICAL PURPOSES ONLY.  IF CONFIRMATION IS NEEDED FOR ANY PURPOSE, NOTIFY LAB WITHIN 5 DAYS.  LOWEST DETECTABLE LIMITS FOR URINE DRUG SCREEN Drug Class                     Cutoff (ng/mL) Amphetamine and metabolites    1000 Barbiturate and metabolites    200 Benzodiazepine                 509 Tricyclics and metabolites     300 Opiates and metabolites        300 Cocaine and metabolites        300 THC                            50 Performed at [facility], [place] [address] [place], [place] [address]    Alcohol, Ethyl (B) 09/09/2021 <10  <10 mg/dL Final   Comment: (NOTE) Lowest detectable limit for serum alcohol is 10 mg/dL.  For medical purposes only. Performed at [facility], [place] [address], [place], [place] [address]    Lipase 09/09/2021 33  11 - 51 U/L Final   Performed at [facility] [address] [place], [place] [address]   Color, Urine 09/09/2021 COLORLESS (A)  YELLOW Final   APPearance 09/09/2021 CLEAR  CLEAR Final   Specific Gravity, Urine 09/09/2021 1.002 (L)  1.005 - 1.030 Final   pH 09/09/2021 6.0  5.0 - 8.0 Final   Glucose, UA  09/09/2021 NEGATIVE  NEGATIVE mg/dL Final   Hgb urine dipstick 09/09/2021 NEGATIVE  NEGATIVE Final   Bilirubin Urine 09/09/2021 NEGATIVE  NEGATIVE Final   Ketones, ur 09/09/2021 NEGATIVE  NEGATIVE mg/dL Final   Protein, ur 09/09/2021 NEGATIVE  NEGATIVE mg/dL Final   Nitrite 09/09/2021 NEGATIVE  NEGATIVE Final   Leukocytes,Ua 09/09/2021 NEGATIVE  NEGATIVE Final   Performed at [facility] [address] [place], [place] [address]   Specimen Description 09/09/2021 URINE, CLEAN CATCH   Final   Special Requests 09/09/2021 NONE   Final   Culture 09/09/2021  (A)   Final                   Value:<10,000 COLONIES/mL INSIGNIFICANT GROWTH Performed at [facility], [place] [address], [place], [place] [address]    Report Status 09/09/2021 09/10/2021 FINAL   Final   D-Dimer, Quant 09/09/2021 0.92 (H)  0.00 - 0.50 ug/mL-FEU Final   Comment: (NOTE) At the manufacturer cut-off value of 0.5 g/mL FEU, this assay has a negative predictive value of 95-100%.This assay is intended for use in conjunction with a clinical pretest probability (PTP) assessment model to exclude pulmonary embolism (PE) and deep venous thrombosis (DVT) in outpatients suspected of PE or DVT. Results should be correlated with clinical presentation. Performed at  [facility], [place] [address], [place], [place] [address]    Troponin I (High Sensitivity) 09/09/2021 5  <18 ng/L Final   Comment: (NOTE) Elevated high sensitivity troponin I (hsTnI) values and significant  changes across serial measurements may suggest ACS but many other  chronic and acute conditions are known to elevate hsTnI results.  Refer to the "Links" section for chest pain algorithms and additional  guidance. Performed at [facility], [place] [address], [place], [place] [address]    BP 09/10/2021 135/83  mmHg Final   S' Lateral 09/10/2021 2.30  cm Final   Area-P 1/2 09/10/2021 5.58  cm2 Final    Blood Alcohol level:  Lab Results   Component Value Date   ETH <10 11/23/2021   ETH <10 05/39/7673    Metabolic Disorder Labs: Lab Results  Component Value Date   HGBA1C 6.7 (H) 12/28/2021   MPG 145.59 12/28/2021   MPG 154.2 10/04/2021   Lab Results  Component Value Date   PROLACTIN 3.8 (L) 11/23/2021   Lab Results  Component Value Date   CHOL 171 11/23/2021   TRIG 125 11/23/2021   HDL 65 11/23/2021   CHOLHDL 2.6 11/23/2021   VLDL 25 11/23/2021   LDLCALC 81 11/23/2021   LDLCALC 95 10/04/2021    Therapeutic Lab Levels: No results found for: "LITHIUM" Lab Results  Component Value Date   VALPROATE 33 (L) 01/18/2022   VALPROATE 33 (L) 12/28/2021   No results found for: "CBMZ"  Physical Findings   PHQ2-9    Flowsheet Row ED [facility]23/2021 in [facility]  PHQ-2 Total Score 2  PHQ-9 Total Score 3      Flowsheet Row ED from 01/23/2022 in [facility] ED from 11/23/2021 in [facility] ED from 11/22/2021 in [facility] DEPT  C-SSRS RISK CATEGORY No Risk No Risk No Risk        Musculoskeletal  Strength & Muscle Tone: within normal limits Gait & Station: normal Patient leans: N/A  Psychiatric Specialty Exam  Presentation  General Appearance:  Appropriate for Environment  Eye Contact: Good  Speech: Clear and Coherent; Normal Rate  Speech Volume: Normal  Handedness: Right   Mood and Affect  Mood: Euthymic  Affect: Congruent   Thought Process  Thought Processes: Coherent; Goal Directed  Descriptions of Associations:Intact  Orientation:Full (Time, Place and Person)  Thought Content:Logical  Diagnosis of Schizophrenia or Schizoaffective disorder in past: Yes  Duration of Psychotic Symptoms: No data recorded  Hallucinations:Hallucinations: None  Ideas of Reference:None  Suicidal Thoughts:Suicidal Thoughts: No  Homicidal Thoughts:Homicidal Thoughts:  No   Sensorium  Memory: Immediate Fair; Recent Fair  Judgment: Intact  Insight: Present   Executive Functions  Concentration: Good  Attention Span: Fair  Recall: [employer] of Knowledge: Fair  Language: Fair   Psychomotor Activity  Psychomotor Activity: Psychomotor Activity: Normal   Assets  Assets: Communication Skills; Desire for Improvement; Financial Resources/Insurance; Leisure Time; Resilience   Sleep  Sleep: Sleep: Good   Nutritional Assessment (For OBS and FBC admissions only) Has the patient had a weight loss or gain of 10 pounds or more in the last 3 months?: No Has the patient had a decrease in food intake/or appetite?: No Does the patient have dental problems?: No Does the patient have eating habits or behaviors that may be indicators of an eating disorder including binging or inducing vomiting?: No Has the patient recently  lost weight without trying?: 0 Has the patient been eating poorly because of a decreased appetite?: 0 Malnutrition Screening Tool Score: 0    Physical Exam  Physical Exam Vitals and nursing note reviewed.  Constitutional:      General: She is not in acute distress.    Appearance: Normal appearance. She is not ill-appearing.  HENT:     Head: Normocephalic.     Mouth/Throat:     Mouth: Mucous membranes are moist.     Pharynx: Oropharynx is clear. Uvula midline. No pharyngeal swelling, oropharyngeal exudate, posterior oropharyngeal erythema or uvula swelling.     Tonsils: No tonsillar exudate.  Eyes:     Conjunctiva/sclera: Conjunctivae normal.  Cardiovascular:     Rate and Rhythm: Normal rate and regular rhythm.  Pulmonary:     Effort: Pulmonary effort is normal.  Musculoskeletal:        General: Normal range of motion.     Cervical back: Normal range of motion.  Skin:    General: Skin is warm and dry.     Coloration: Skin is not jaundiced or pale.  Neurological:     Mental Status: She is alert and  oriented to person, place, and time.  Psychiatric:        Attention and Perception: Attention and perception normal. She does not perceive auditory or visual hallucinations.        Mood and Affect: Mood and affect normal.        Speech: Speech normal.        Behavior: Behavior normal. Behavior is cooperative.        Thought Content: Thought content normal. Thought content is not paranoid or delusional. Thought content does not include homicidal or suicidal ideation.        Cognition and Memory: Cognition normal.        Judgment: Judgment normal.    Review of Systems  Constitutional: Negative.   HENT: Negative.  Sore throat: Report her throat feels sore.   Eyes: Negative.   Respiratory: Negative.    Cardiovascular: Negative.  Negative for chest pain (Denies chest pain at this time).  Musculoskeletal: Negative.   Skin: Negative.   Neurological: Negative.   Psychiatric/Behavioral: Negative.  Depression: Stable. Hallucinations: Denies. Suicidal ideas: Denies. Nervous/anxious: Stable.    Blood pressure (!) 156/88, pulse 87, temperature 98.4 F (36.9 C), temperature source Oral, resp. rate 18, SpO2 99 %. There is no height or weight on file to calculate BMI.  Treatment Plan Summary:  Patient remains psychiatrically cleared. Social work and DSS actively seeking placement.  Social work to follow up on patients placement and with APS as far as the guardianship of patient.    Meds ordered this encounter  Medications   apixaban (ELIQUIS) tablet 5 mg bid   Vitamin D (Ergocalciferol) (DRISDOL) 1.25 MG (50000 UNIT) capsule 50,000 Units q7 days   cloZAPine (CLOZARIL) tablet 75 mg qhs   cloZAPine (CLOZARIL) tablet 50 mg qd   divalproex (DEPAKOTE ER) 24 hr tablet 500 mg bid   latanoprost (XALATAN) 0.005 % ophthalmic solution 1 drop qhs   albuterol (VENTOLIN HFA) 108 (90 Base) MCG/ACT inhaler 2 puff prn   haloperidol (HALDOL) tablet 10 mg Q8 hours PRN   nicotine (NICODERM CQ - dosed in mg/24  hr) patch 7 mg daily   fluticasone furoate-vilanterol (BREO ELLIPTA) 200-25 MCG/ACT 1 puff daily   diltiazem (CARDIZEM CD) 24 hr capsule 240 mg daily   ARIPiprazole (ABILIFY) tablet 10 mg daily   valbenazine (  INGREZZA) capsule 40 mg bid   insulin aspart (novoLOG) injection 0-9 Units    Order Specific Question:   Correction coverage:    Answer:   Sensitive (thin, NPO, renal)    Order Specific Question:   CBG < 70:    Answer:   Implement Hypoglycemia Standing Orders and refer to Hypoglycemia Standing Orders sidebar report    Order Specific Question:   CBG 70 - 120:    Answer:   0 units    Order Specific Question:   CBG 121 - 150:    Answer:   1 unit    Order Specific Question:   CBG 151 - 200:    Answer:   2 units    Order Specific Question:   CBG 201 - 250:    Answer:   3 units    Order Specific Question:   CBG 251 - 300:    Answer:   5 units    Order Specific Question:   CBG 301 - 350:    Answer:   7 units    Order Specific Question:   CBG 351 - 400    Answer:   9 units    Order Specific Question:   CBG > 400    Answer:   call MD and obtain STAT lab verification   insulin glargine-yfgn (SEMGLEE) injection 8 Units qd   metFORMIN (GLUCOPHAGE-XR) 24 hr tablet 1,000 mg bid   acetaminophen (TYLENOL) tablet 650 mg   alum & mag hydroxide-simeth (MAALOX/MYLANTA) 200-200-20 MG/5ML suspension 30 mL   magnesium hydroxide (MILK OF MAGNESIA) suspension 30 mL   hydrOXYzine (ATARAX) tablet 25 mg   traZODone (DESYREL) tablet 50 mg     Lab Orders          CBC with Differential  01/26/2022      CBC with Differential   01/28/2022      CBC with Differential    01/30/2022  Chrystian Cupples, NP 01/26/2022 10:28 AM

## 2022-01-26 NOTE — ED Notes (Signed)
Pt A&O x 4, no distress noted, calm & cooperative.  Monitoring for safety. 

## 2022-01-26 NOTE — ED Notes (Signed)
Pt accepted scheduled meds w/o difficulty along with insulin per orders. Blood sugars checked ac/hs. Pt currently resting on pull out bed/chair in no distress. Safety maintained and will continue to monitor.

## 2022-01-26 NOTE — ED Notes (Signed)
Patient observed/assessed on unit. Patient went to utilize restroom. Ambulation slowed but steady. Patient appears sedated. Will continue to monitor/support.

## 2022-01-26 NOTE — ED Notes (Signed)
Pt provided with snack

## 2022-01-26 NOTE — Progress Notes (Signed)
Patient assessed, AOX4. Calm and cooperative. No distress noted. Watching TV. Will Continue to monitor for safety.

## 2022-01-26 NOTE — ED Notes (Signed)
Pt sleeping at present, no distress noted.  Monitoring for safety. 

## 2022-01-26 NOTE — Progress Notes (Addendum)
LCSW Progress Note   LCSW sent encrypted message to Rulon Sera @ [facility] DSS - APS requesting an update regarding the case of the pt receiving a legal guardian. Per Ms. Katrinka Blazing, the pt does not meet criteria for a legal guardian and the need for protection was unsubstantiated.   Hansel Starling, MSW, LCSW [facility] [phone] phone [phone] fax

## 2022-01-27 DIAGNOSIS — F209 Schizophrenia, unspecified: Secondary | ICD-10-CM | POA: Diagnosis not present

## 2022-01-27 LAB — GLUCOSE, CAPILLARY
Glucose-Capillary: 129 mg/dL — ABNORMAL HIGH (ref 70–99)
Glucose-Capillary: 101 mg/dL — ABNORMAL HIGH (ref 70–99)
Glucose-Capillary: 220 mg/dL — ABNORMAL HIGH (ref 70–99)
Glucose-Capillary: 154 mg/dL — ABNORMAL HIGH (ref 70–99)
Glucose-Capillary: 163 mg/dL — ABNORMAL HIGH (ref 70–99)

## 2022-01-27 NOTE — ED Notes (Signed)
Patient observed/assessed in bed/chair resting quietly appearing in no distress and verbalizing no complaints at this time. Will continue to monitor.  

## 2022-01-27 NOTE — Progress Notes (Signed)
[facility] went to update the client on updates but she was sleep.  [facility] and outside agencies are continuing to research placements for the client.    Teresa Murillo [facility]  

## 2022-01-27 NOTE — ED Notes (Signed)
Pt sleeping@this time. Breathing even and unlabored. Will continue to monitor for safety 

## 2022-01-27 NOTE — ED Provider Notes (Signed)
Behavioral Health Progress Note  Date and Time: 01/27/2022 9:32 AM Name: Teresa Murillo MRN:  [id number]  Subjective:    Patient states "I'm better. I feel a little sleepy but I know that is because of my medicine."   Per chart review on 01/27/2022: Teresa Murillo 61 y.o., female patient with a past psychiatric history of schizophrenia, aggressive behavior, and borderline intellectual disability who initially presented to [facility] on 11/23/2021 via [facility].  At that time patient had been dismissed from the group home due to multiple elopements.  Patient has remained at the [facility] while awaiting new placement.  Social work and DSS are involved.  Patient is reassessed, face-to-face, by nurse practitioner.  She is reclined in observation area upon my approach.  She is alert and oriented, pleasant and cooperative during assessment.  She presents with euthymic mood, congruent affect.  Meliza continues to deny suicidal and homicidal ideation.  She easily contracts verbally for safety with this Probation officer.  She denies auditory and visual hallucinations.  There is no evidence of delusional thought content and no indication that patient is responding to internal stimuli.  She endorses average sleep and appetite.  Patient's behavior is pleasant and cooperative at [facility] health.  She is compliant with medication and engages appropriately with peers.  Patient offered support and encouragement.  She verbalizes understanding of treatment plan.  She states "it is there any news about where I am going to go?"  She reports she she is looking forward to discharge as soon as possible.  Patient continues to remain psychiatrically cleared.  She remains boarding on the unit while social work and DSS is seeking placement.  Diagnosis:  Final diagnoses:  Schizophrenia, unspecified type ([place])  At risk for self care deficit  Noncompliance  Type 2 diabetes mellitus without complication, unspecified whether  long term insulin use ([place])    Total Time spent with patient: 20 minutes  Past Psychiatric History: see H & P Past Medical History:  Past Medical History:  Diagnosis Date   Borderline intellectual functioning 12/14/2021   On 12/14/2021: Appreciate assistance from psychology consult. On the Wechsler Adult Intelligence Scale-4, Ms. Desir achieved a full-scale IQ score of 73 and a percentile rank of 4 placing her in the borderline range of intellectual functioning.    Chronic obstructive pulmonary disease (COPD) (HCC)    Glaucoma    Hyperlipidemia    Hypertension    Iron deficiency    Schizoaffective disorder (HCC)    Type 2 diabetes mellitus (HCC)     Past Surgical History:  Procedure Laterality Date   TUBAL LIGATION     Family History:  Family History  Problem Relation Age of Onset   Breast cancer Maternal Grandmother    Family Psychiatric  History: See H & P Social History:  Social History   Substance and Sexual Activity  Alcohol Use Yes     Social History   Substance and Sexual Activity  Drug Use Not Currently    Social History   Socioeconomic History   Marital status: Divorced    Spouse name: Not on file   Number of children: Not on file   Years of education: Not on file   Highest education level: Not on file  Occupational History   Not on file  Tobacco Use   Smoking status: Every Day    Packs/day: 1.00    Types: Cigars, Cigarettes   Smokeless tobacco: Current  Vaping Use   Vaping Use: Never  used  Substance and Sexual Activity   Alcohol use: Yes   Drug use: Not Currently   Sexual activity: Not Currently    Birth control/protection: Surgical  Other Topics Concern   Not on file  Social History Narrative   Not on file   Social Determinants of Health   Financial Resource Strain: Not on file  Food Insecurity: Not on file  Transportation Needs: Not on file  Physical Activity: Not on file  Stress: Not on file  Social Connections: Not on file    SDOH:  SDOH Screenings   Depression (PHQ2-9): Low Risk  (11/23/2021)  Tobacco Use: High Risk (01/26/2022)   Additional Social History:                         Sleep: Good  Appetite:  Good  Current Medications:  Current Facility-Administered Medications  Medication Dose Route Frequency Provider Last Rate Last Admin   acetaminophen (TYLENOL) tablet 650 mg  650 mg Oral Q6H PRN Revonda Humphrey, NP   650 mg at 01/25/22 2138   albuterol (VENTOLIN HFA) 108 (90 Base) MCG/ACT inhaler 2 puff  2 puff Inhalation Q6H PRN Revonda Humphrey, NP       alum & mag hydroxide-simeth (MAALOX/MYLANTA) 200-200-20 MG/5ML suspension 30 mL  30 mL Oral Q4H PRN Revonda Humphrey, NP       apixaban Arne Cleveland) tablet 5 mg  5 mg Oral BID Revonda Humphrey, NP   5 mg at 01/27/22 0906   ARIPiprazole (ABILIFY) tablet 10 mg  10 mg Oral Daily Revonda Humphrey, NP   10 mg at 01/27/22 0906   cloZAPine (CLOZARIL) tablet 50 mg  50 mg Oral Daily Revonda Humphrey, NP   50 mg at 01/27/22 0906   cloZAPine (CLOZARIL) tablet 75 mg  75 mg Oral QHS Revonda Humphrey, NP   75 mg at 01/26/22 2113   diltiazem (CARDIZEM CD) 24 hr capsule 240 mg  240 mg Oral Daily Revonda Humphrey, NP   240 mg at 01/27/22 0906   divalproex (DEPAKOTE ER) 24 hr tablet 500 mg  500 mg Oral BID Revonda Humphrey, NP   500 mg at 01/27/22 0907   fluticasone furoate-vilanterol (BREO ELLIPTA) 200-25 MCG/ACT 1 puff  1 puff Inhalation Daily Revonda Humphrey, NP   1 puff at 01/27/22 0907   haloperidol (HALDOL) tablet 10 mg  10 mg Oral TID PRN Revonda Humphrey, NP       hydrOXYzine (ATARAX) tablet 25 mg  25 mg Oral TID PRN Revonda Humphrey, NP   25 mg at 01/27/22 0156   insulin aspart (novoLOG) injection 0-9 Units  0-9 Units Subcutaneous TID WC Revonda Humphrey, NP   2 Units at 01/26/22 1700   insulin glargine-yfgn (SEMGLEE) injection 8 Units  8 Units Subcutaneous Q1200 Revonda Humphrey, NP   8 Units at 01/26/22 1139   latanoprost  (XALATAN) 0.005 % ophthalmic solution 1 drop  1 drop Both Eyes QHS Thomes Lolling H, NP   1 drop at 01/26/22 2121   magnesium hydroxide (MILK OF MAGNESIA) suspension 30 mL  30 mL Oral Daily PRN Revonda Humphrey, NP       metFORMIN (GLUCOPHAGE-XR) 24 hr tablet 1,000 mg  1,000 mg Oral BID WC Revonda Humphrey, NP   1,000 mg at 01/27/22 0906   nicotine (NICODERM CQ - dosed in mg/24 hr) patch 7 mg  7 mg Transdermal Daily Chana Bode,  Victory Dakin, NP   7 mg at 01/27/22 3875   traZODone (DESYREL) tablet 50 mg  50 mg Oral QHS PRN Revonda Humphrey, NP   50 mg at 01/27/22 0156   valbenazine (INGREZZA) capsule 40 mg  40 mg Oral q AM Revonda Humphrey, NP   40 mg at 01/27/22 0617   [START ON 01/28/2022] Vitamin D (Ergocalciferol) (DRISDOL) 1.25 MG (50000 UNIT) capsule 50,000 Units  50,000 Units Oral Q Sharren Bridge, NP       Current Outpatient Medications  Medication Sig Dispense Refill   Accu-Chek Softclix Lancets lancets Use as directed up to four times daily 100 each 0   apixaban (ELIQUIS) 5 MG TABS tablet Take 1 tablet (5 mg total) by mouth 2 (two) times daily. 60 tablet 0   ARIPiprazole (ABILIFY) 10 MG tablet Take 1 tablet (10 mg total) by mouth daily. 30 tablet 0   Blood Glucose Monitoring Suppl (ACCU-CHEK GUIDE) w/Device KIT Use as directed up to four times daily 1 kit 0   budesonide-formoterol (SYMBICORT) 160-4.5 MCG/ACT inhaler Inhale 2 puffs into the lungs in the morning and at bedtime.     Cholecalciferol (VITAMIN D3) 1.25 MG (50000 UT) CAPS Take 50,000 Units by mouth every Thursday.     cloZAPine (CLOZARIL) 25 MG tablet Take 3 tablets (75 mg total) by mouth at bedtime. 90 tablet 0   clozapine (CLOZARIL) 50 MG tablet Take 1 tablet (50 mg total) by mouth daily. 30 tablet 0   diltiazem (CARDIZEM CD) 240 MG 24 hr capsule Take 1 capsule (240 mg total) by mouth daily. (Patient not taking: Reported on 11/24/2021) 30 capsule 0   divalproex (DEPAKOTE ER) 500 MG 24 hr tablet Take 1 tablet (500 mg  total) by mouth 2 (two) times daily. 60 tablet 0   glucose blood test strip Use as directed up to four times daily 50 each 0   haloperidol (HALDOL) 10 MG tablet Take 1 tablet (10 mg total) by mouth 3 (three) times daily as needed for agitation (and psychotic symptoms).     INGREZZA 40 MG capsule Take 1 capsule (40 mg total) by mouth in the morning. 30 capsule 0   insulin aspart (NOVOLOG) 100 UNIT/ML FlexPen Before each meal 3 times a day, 140-199 - 2 units, 200-250 - 4 units, 251-299 - 6 units,  300-349 - 8 units,  350 or above 10 units. Insulin PEN if approved, provide syringes and needles if needed.Please switch to any approved short acting Insulin if needed. 15 mL 0   insulin glargine (LANTUS) 100 UNIT/ML Solostar Pen Inject 12 Units into the skin daily. 15 mL 0   Insulin Pen Needle 32G X 4 MM MISC Use 4 times a day with insulin, 1 month supply. 100 each 0   latanoprost (XALATAN) 0.005 % ophthalmic solution Place 1 drop into both eyes at bedtime.     metFORMIN (GLUCOPHAGE) 500 MG tablet Take 500 mg by mouth 2 (two) times daily with a meal.     nicotine (NICODERM CQ - DOSED IN MG/24 HR) 7 mg/24hr patch Place 1 patch (7 mg total) onto the skin daily. 28 patch 0   PROAIR HFA 108 (90 Base) MCG/ACT inhaler Inhale 2 puffs into the lungs every 6 (six) hours as needed for wheezing or shortness of breath.      Labs  Lab Results:  Admission on 01/24/2022  Component Date Value Ref Range Status   Glucose-Capillary 01/24/2022 143 (H)  70 - 99  mg/dL Final   Glucose reference range applies only to samples taken after fasting for at least 8 hours.   Glucose-Capillary 01/24/2022 120 (H)  70 - 99 mg/dL Final   Glucose reference range applies only to samples taken after fasting for at least 8 hours.   Glucose-Capillary 01/25/2022 122 (H)  70 - 99 mg/dL Final   Glucose reference range applies only to samples taken after fasting for at least 8 hours.   Glucose-Capillary 01/25/2022 190 (H)  70 - 99 mg/dL Final    Glucose reference range applies only to samples taken after fasting for at least 8 hours.   Glucose-Capillary 01/25/2022 163 (H)  70 - 99 mg/dL Final   Glucose reference range applies only to samples taken after fasting for at least 8 hours.   WBC 01/26/2022 6.5  4.0 - 10.5 K/uL Final   RBC 01/26/2022 4.53  3.87 - 5.11 MIL/uL Final   Hemoglobin 01/26/2022 11.3 (L)  12.0 - 15.0 g/dL Final   HCT 01/26/2022 36.4  36.0 - 46.0 % Final   MCV 01/26/2022 80.4  80.0 - 100.0 fL Final   MCH 01/26/2022 24.9 (L)  26.0 - 34.0 pg Final   MCHC 01/26/2022 31.0  30.0 - 36.0 g/dL Final   RDW 01/26/2022 17.3 (H)  11.5 - 15.5 % Final   Platelets 01/26/2022 327  150 - 400 K/uL Final   nRBC 01/26/2022 0.0  0.0 - 0.2 % Final   Neutrophils Relative % 01/26/2022 60  % Final   Neutro Abs 01/26/2022 3.9  1.7 - 7.7 K/uL Final   Lymphocytes Relative 01/26/2022 31  % Final   Lymphs Abs 01/26/2022 2.0  0.7 - 4.0 K/uL Final   Monocytes Relative 01/26/2022 7  % Final   Monocytes Absolute 01/26/2022 0.5  0.1 - 1.0 K/uL Final   Eosinophils Relative 01/26/2022 1  % Final   Eosinophils Absolute 01/26/2022 0.1  0.0 - 0.5 K/uL Final   Basophils Relative 01/26/2022 1  % Final   Basophils Absolute 01/26/2022 0.1  0.0 - 0.1 K/uL Final   Immature Granulocytes 01/26/2022 0  % Final   Abs Immature Granulocytes 01/26/2022 0.02  0.00 - 0.07 K/uL Final   Performed at [facility], [place] [address] [place], [place] [address]   Glucose-Capillary 01/26/2022 149 (H)  70 - 99 mg/dL Final   Glucose reference range applies only to samples taken after fasting for at least 8 hours.   Glucose-Capillary 01/26/2022 130 (H)  70 - 99 mg/dL Final   Glucose reference range applies only to samples taken after fasting for at least 8 hours.   Glucose-Capillary 01/26/2022 151 (H)  70 - 99 mg/dL Final   Glucose reference range applies only to samples taken after fasting for at least 8 hours.   Glucose-Capillary 01/26/2022 170 (H)  70 - 99 mg/dL  Final   Glucose reference range applies only to samples taken after fasting for at least 8 hours.   Glucose-Capillary 01/27/2022 129 (H)  70 - 99 mg/dL Final   Glucose reference range applies only to samples taken after fasting for at least 8 hours.   Glucose-Capillary 01/27/2022 101 (H)  70 - 99 mg/dL Final   Glucose reference range applies only to samples taken after fasting for at least 8 hours.  Admission on 01/23/2022, Discharged on 01/24/2022  Component Date Value Ref Range Status   Sodium 01/23/2022 140  135 - 145 mmol/L Final   Potassium 01/23/2022 4.4  3.5 -  5.1 mmol/L Final   Chloride 01/23/2022 101  98 - 111 mmol/L Final   CO2 01/23/2022 25  22 - 32 mmol/L Final   Glucose, Bld 01/23/2022 198 (H)  70 - 99 mg/dL Final   Glucose reference range applies only to samples taken after fasting for at least 8 hours.   BUN 01/23/2022 13  6 - 20 mg/dL Final   Creatinine, Ser 01/23/2022 0.62  0.44 - 1.00 mg/dL Final   Calcium 01/23/2022 9.3  8.9 - 10.3 mg/dL Final   GFR, Estimated 01/23/2022 >60  >60 mL/min Final   Comment: (NOTE) Calculated using the CKD-EPI Creatinine Equation (2021)    Anion gap 01/23/2022 14  5 - 15 Final   Performed at [facility], [place] [address] [place], [place] [address]   WBC 01/23/2022 5.8  4.0 - 10.5 K/uL Final   RBC 01/23/2022 4.63  3.87 - 5.11 MIL/uL Final   Hemoglobin 01/23/2022 11.7 (L)  12.0 - 15.0 g/dL Final   HCT 01/23/2022 37.4  36.0 - 46.0 % Final   MCV 01/23/2022 80.8  80.0 - 100.0 fL Final   MCH 01/23/2022 25.3 (L)  26.0 - 34.0 pg Final   MCHC 01/23/2022 31.3  30.0 - 36.0 g/dL Final   RDW 01/23/2022 17.9 (H)  11.5 - 15.5 % Final   Platelets 01/23/2022 333  150 - 400 K/uL Final   nRBC 01/23/2022 0.0  0.0 - 0.2 % Final   Performed at [place] [address] [place], [place] [address]   Troponin I (High Sensitivity) 01/23/2022 6  <18 ng/L Final   Comment: (NOTE) Elevated high sensitivity troponin I (hsTnI) values and significant   changes across serial measurements may suggest ACS but many other  chronic and acute conditions are known to elevate hsTnI results.  Refer to the "Links" section for chest pain algorithms and additional  guidance. Performed at [facility], [place] [address], [place], [place] [address]    Troponin I (High Sensitivity) 01/23/2022 5  <18 ng/L Final   Comment: (NOTE) Elevated high sensitivity troponin I (hsTnI) values and significant  changes across serial measurements may suggest ACS but many other  chronic and acute conditions are known to elevate hsTnI results.  Refer to the "Links" section for chest pain algorithms and additional  guidance. Performed at [facility], [place] [address] [place], [place] [address]    SARS Coronavirus 2 by RT PCR 01/23/2022 NEGATIVE  NEGATIVE Final   Comment: (NOTE) SARS-CoV-2 target nucleic acids are NOT DETECTED.  The SARS-CoV-2 RNA is generally detectable in upper and lower respiratory specimens during the acute phase of infection. The lowest concentration of SARS-CoV-2 viral copies this assay can detect is 250 copies / mL. A negative result does not preclude SARS-CoV-2 infection and should not be used as the sole basis for treatment or other patient management decisions.  A negative result may occur with improper specimen collection / handling, submission of specimen other than nasopharyngeal swab, presence of viral mutation(s) within the areas targeted by this assay, and inadequate number of viral copies (<250 copies / mL). A negative result must be combined with clinical observations, patient history, and epidemiological information.  Fact Sheet for Patients:   https://www.patel.info/  Fact Sheet for Healthcare Providers: https://hall.com/  This test is not yet approved or  cleared by the [place] and has been authorized for detection and/or diagnosis of  SARS-CoV-2 by FDA under an Emergency Use Authorization (EUA).  This EUA will remain in effect (meaning this test can be used) for the duration of the COVID-19 declaration under Section 564(b)(1) of the Act, 21 U.S.C. section 360bbb-3(b)(1), unless the authorization is terminated or revoked sooner.  Performed at [facility], [place] [address] [place], [place] [address]    B Natriuretic Peptide 01/23/2022 41.3  0.0 - 100.0 pg/mL Final   Performed at [place] [address], [place], [place] [address]   Group A Strep by PCR 01/23/2022 NOT DETECTED  NOT DETECTED Final   Performed at [facility], [place] [address] [place], [place] [address]   Color, Urine 01/23/2022 YELLOW  YELLOW Final   APPearance 01/23/2022 CLEAR  CLEAR Final   Specific Gravity, Urine 01/23/2022 1.018  1.005 - 1.030 Final   pH 01/23/2022 7.0  5.0 - 8.0 Final   Glucose, UA 01/23/2022 NEGATIVE  NEGATIVE mg/dL Final   Hgb urine dipstick 01/23/2022 NEGATIVE  NEGATIVE Final   Bilirubin Urine 01/23/2022 NEGATIVE  NEGATIVE Final   Ketones, ur 01/23/2022 NEGATIVE  NEGATIVE mg/dL Final   Protein, ur 01/23/2022 NEGATIVE  NEGATIVE mg/dL Final   Nitrite 01/23/2022 NEGATIVE  NEGATIVE Final   Leukocytes,Ua 01/23/2022 TRACE (A)  NEGATIVE Final   RBC / HPF 01/23/2022 0-5  0 - 5 RBC/hpf Final   WBC, UA 01/23/2022 0-5  0 - 5 WBC/hpf Final   Bacteria, UA 01/23/2022 NONE SEEN  NONE SEEN Final   Squamous Epithelial / LPF 01/23/2022 0-5  0 - 5 Final   Mucus 01/23/2022 PRESENT   Final   Performed at [facility], [place] [address], [place], [place] [address]   SARS Coronavirus 2 by RT PCR 01/23/2022 NEGATIVE  NEGATIVE Final   Comment: (NOTE) SARS-CoV-2 target nucleic acids are NOT DETECTED.  The SARS-CoV-2 RNA is generally detectable in upper respiratory specimens during the acute phase of infection. The lowest concentration of SARS-CoV-2 viral copies this assay can detect is 138 copies/mL. A negative result does  not preclude SARS-Cov-2 infection and should not be used as the sole basis for treatment or other patient management decisions. A negative result may occur with  improper specimen collection/handling, submission of specimen other than nasopharyngeal swab, presence of viral mutation(s) within the areas targeted by this assay, and inadequate number of viral copies(<138 copies/mL). A negative result must be combined with clinical observations, patient history, and epidemiological information. The expected result is Negative.  Fact Sheet for Patients:  EntrepreneurPulse.com.au  Fact Sheet for Healthcare Providers:  IncredibleEmployment.be  This test is no                          t yet approved or cleared by the [place] FDA and  has been authorized for detection and/or diagnosis of SARS-CoV-2 by FDA under an Emergency Use Authorization (EUA). This EUA will remain  in effect (meaning this test can be used) for the duration of the COVID-19 declaration under Section 564(b)(1) of the Act, 21 U.S.C.section 360bbb-3(b)(1), unless the authorization is terminated  or revoked sooner.       Influenza A by PCR 01/23/2022 NEGATIVE  NEGATIVE Final   Influenza B by PCR 01/23/2022 NEGATIVE  NEGATIVE Final   Comment: (NOTE) The Xpert Xpress SARS-CoV-2/FLU/RSV plus assay is intended as an aid in  the diagnosis of influenza from Nasopharyngeal swab specimens and should not be used as a sole basis for treatment. Nasal washings and aspirates are unacceptable for Xpert Xpress SARS-CoV-2/FLU/RSV testing.  Fact Sheet for Patients: EntrepreneurPulse.com.au  Fact Sheet for Healthcare Providers: IncredibleEmployment.be  This test is not yet approved or cleared by the [place] FDA and has been authorized for detection and/or diagnosis of SARS-CoV-2 by FDA under an Emergency Use Authorization (EUA). This EUA will remain in  effect (meaning this test can be used) for the duration of the COVID-19 declaration under Section 564(b)(1) of the Act, 21 U.S.C. section 360bbb-3(b)(1), unless the authorization is terminated or revoked.  Performed at [facility], [place] [address] [place], [place] [address]    WBC 01/24/2022 6.1  4.0 - 10.5 K/uL Final   RBC 01/24/2022 4.60  3.87 - 5.11 MIL/uL Final   Hemoglobin 01/24/2022 11.7 (L)  12.0 - 15.0 g/dL Final   HCT 01/24/2022 37.0  36.0 - 46.0 % Final   MCV 01/24/2022 80.4  80.0 - 100.0 fL Final   MCH 01/24/2022 25.4 (L)  26.0 - 34.0 pg Final   MCHC 01/24/2022 31.6  30.0 - 36.0 g/dL Final   RDW 01/24/2022 17.6 (H)  11.5 - 15.5 % Final   Platelets 01/24/2022 334  150 - 400 K/uL Final   nRBC 01/24/2022 0.0  0.0 - 0.2 % Final   Neutrophils Relative % 01/24/2022 53  % Final   Neutro Abs 01/24/2022 3.3  1.7 - 7.7 K/uL Final   Lymphocytes Relative 01/24/2022 33  % Final   Lymphs Abs 01/24/2022 2.0  0.7 - 4.0 K/uL Final   Monocytes Relative 01/24/2022 11  % Final   Monocytes Absolute 01/24/2022 0.7  0.1 - 1.0 K/uL Final   Eosinophils Relative 01/24/2022 2  % Final   Eosinophils Absolute 01/24/2022 0.1  0.0 - 0.5 K/uL Final   Basophils Relative 01/24/2022 1  % Final   Basophils Absolute 01/24/2022 0.1  0.0 - 0.1 K/uL Final   Immature Granulocytes 01/24/2022 0  % Final   Abs Immature Granulocytes 01/24/2022 0.02  0.00 - 0.07 K/uL Final   Performed at [facility], [place] [address] [place], [place] [address]   Glucose-Capillary 01/24/2022 110 (H)  70 - 99 mg/dL Final   Glucose reference range applies only to samples taken after fasting for at least 8 hours.  No results displayed because visit has over 200 results.    Admission on 11/22/2021, Discharged on 11/22/2021  Component Date Value Ref Range Status   Glucose-Capillary 11/22/2021 169 (H)  70 - 99 mg/dL Final   Glucose reference range applies only to samples taken after fasting for at least 8 hours.  No  results displayed because visit has over 200 results.    Admission on 10/04/2021, Discharged on 10/22/2021  Component Date Value Ref Range Status   SARS Coronavirus 2 by RT PCR 10/04/2021 NEGATIVE  NEGATIVE Final   Comment: (NOTE) SARS-CoV-2 target nucleic acids are NOT DETECTED.  The SARS-CoV-2 RNA is generally detectable in upper respiratory specimens during the acute phase of infection. The lowest concentration of SARS-CoV-2 viral copies this assay can detect is 138 copies/mL. A negative result does not preclude SARS-Cov-2 infection and should not be used as the sole basis for treatment or other patient management decisions. A negative result may occur with  improper specimen collection/handling, submission of specimen other than nasopharyngeal swab, presence of viral mutation(s) within the areas targeted by this  assay, and inadequate number of viral copies(<138 copies/mL). A negative result must be combined with clinical observations, patient history, and epidemiological information. The expected result is Negative.  Fact Sheet for Patients:  EntrepreneurPulse.com.au  Fact Sheet for Healthcare Providers:  IncredibleEmployment.be  This test is no                          t yet approved or cleared by the [place] FDA and  has been authorized for detection and/or diagnosis of SARS-CoV-2 by FDA under an Emergency Use Authorization (EUA). This EUA will remain  in effect (meaning this test can be used) for the duration of the COVID-19 declaration under Section 564(b)(1) of the Act, 21 U.S.C.section 360bbb-3(b)(1), unless the authorization is terminated  or revoked sooner.       Influenza A by PCR 10/04/2021 NEGATIVE  NEGATIVE Final   Influenza B by PCR 10/04/2021 NEGATIVE  NEGATIVE Final   Comment: (NOTE) The Xpert Xpress SARS-CoV-2/FLU/RSV plus assay is intended as an aid in the diagnosis of influenza from Nasopharyngeal swab specimens  and should not be used as a sole basis for treatment. Nasal washings and aspirates are unacceptable for Xpert Xpress SARS-CoV-2/FLU/RSV testing.  Fact Sheet for Patients: EntrepreneurPulse.com.au  Fact Sheet for Healthcare Providers: IncredibleEmployment.be  This test is not yet approved or cleared by the [place] FDA and has been authorized for detection and/or diagnosis of SARS-CoV-2 by FDA under an Emergency Use Authorization (EUA). This EUA will remain in effect (meaning this test can be used) for the duration of the COVID-19 declaration under Section 564(b)(1) of the Act, 21 U.S.C. section 360bbb-3(b)(1), unless the authorization is terminated or revoked.  Performed at [facility], [place] [address], [place], [place] [address]    WBC 10/04/2021 8.4  4.0 - 10.5 K/uL Final   RBC 10/04/2021 4.42  3.87 - 5.11 MIL/uL Final   Hemoglobin 10/04/2021 11.6 (L)  12.0 - 15.0 g/dL Final   HCT 10/04/2021 35.7 (L)  36.0 - 46.0 % Final   MCV 10/04/2021 80.8  80.0 - 100.0 fL Final   MCH 10/04/2021 26.2  26.0 - 34.0 pg Final   MCHC 10/04/2021 32.5  30.0 - 36.0 g/dL Final   RDW 10/04/2021 16.0 (H)  11.5 - 15.5 % Final   Platelets 10/04/2021 372  150 - 400 K/uL Final   nRBC 10/04/2021 0.0  0.0 - 0.2 % Final   Neutrophils Relative % 10/04/2021 68  % Final   Neutro Abs 10/04/2021 5.7  1.7 - 7.7 K/uL Final   Lymphocytes Relative 10/04/2021 22  % Final   Lymphs Abs 10/04/2021 1.8  0.7 - 4.0 K/uL Final   Monocytes Relative 10/04/2021 8  % Final   Monocytes Absolute 10/04/2021 0.7  0.1 - 1.0 K/uL Final   Eosinophils Relative 10/04/2021 1  % Final   Eosinophils Absolute 10/04/2021 0.1  0.0 - 0.5 K/uL Final   Basophils Relative 10/04/2021 1  % Final   Basophils Absolute 10/04/2021 0.1  0.0 - 0.1 K/uL Final   Immature Granulocytes 10/04/2021 0  % Final   Abs Immature Granulocytes 10/04/2021 0.03  0.00 - 0.07 K/uL Final   Performed at [place], [place] [address], [place], [place] [address]   Sodium 10/04/2021 136  135 - 145 mmol/L Final   Potassium 10/04/2021 4.2  3.5 - 5.1 mmol/L Final   Chloride 10/04/2021 104  98 - 111 mmol/L Final  CO2 10/04/2021 25  22 - 32 mmol/L Final   Glucose, Bld 10/04/2021 100 (H)  70 - 99 mg/dL Final   Glucose reference range applies only to samples taken after fasting for at least 8 hours.   BUN 10/04/2021 9  6 - 20 mg/dL Final   Creatinine, Ser 10/04/2021 0.52  0.44 - 1.00 mg/dL Final   Calcium 10/04/2021 9.0  8.9 - 10.3 mg/dL Final   Total Protein 10/04/2021 7.0  6.5 - 8.1 g/dL Final   Albumin 10/04/2021 3.2 (L)  3.5 - 5.0 g/dL Final   AST 10/04/2021 13 (L)  15 - 41 U/L Final   ALT 10/04/2021 10  0 - 44 U/L Final   Alkaline Phosphatase 10/04/2021 61  38 - 126 U/L Final   Total Bilirubin 10/04/2021 0.3  0.3 - 1.2 mg/dL Final   GFR, Estimated 10/04/2021 >60  >60 mL/min Final   Comment: (NOTE) Calculated using the CKD-EPI Creatinine Equation (2021)    Anion gap 10/04/2021 7  5 - 15 Final   Performed at [facility] [address], [place], [place] [address]   Hgb A1c MFr Bld 10/04/2021 7.0 (H)  4.8 - 5.6 % Final   Comment: (NOTE) Pre diabetes:          5.7%-6.4%  Diabetes:              >6.4%  Glycemic control for   <7.0% adults with diabetes    Mean Plasma Glucose 10/04/2021 154.2  mg/dL Final   Performed at [facility], [place] [address], [place], [place] [address]   Cholesterol 10/04/2021 178  0 - 200 mg/dL Final   Triglycerides 10/04/2021 155 (H)  <150 mg/dL Final   HDL 10/04/2021 52  >40 mg/dL Final   Total CHOL/HDL Ratio 10/04/2021 3.4  RATIO Final   VLDL 10/04/2021 31  0 - 40 mg/dL Final   LDL Cholesterol 10/04/2021 95  0 - 99 mg/dL Final   Comment:        Total Cholesterol/HDL:CHD Risk Coronary Heart Disease Risk Table                     Men   Women  1/2 Average Risk   3.4   3.3  Average Risk       5.0   4.4  2 X Average Risk   9.6   7.1  3 X Average Risk  23.4    11.0        Use the calculated Patient Ratio above and the CHD Risk Table to determine the patient's CHD Risk.        ATP III CLASSIFICATION (LDL):  <100     mg/dL   Optimal  100-129  mg/dL   Near or Above                    Optimal  130-159  mg/dL   Borderline  160-189  mg/dL   High  >190     mg/dL   Very High Performed at [facility] [address] [place], [place] [address]    POC Amphetamine UR 10/04/2021 None Detected  NONE DETECTED (Cut Off Level 1000 ng/mL) Final   POC Secobarbital (BAR) 10/04/2021 None Detected  NONE DETECTED (Cut Off Level 300 ng/mL) Final   POC Buprenorphine (BUP) 10/04/2021 None Detected  NONE DETECTED (Cut Off Level 10 ng/mL) Final   POC Oxazepam (BZO) 10/04/2021 None Detected  NONE DETECTED (  Cut Off Level 300 ng/mL) Final   POC Cocaine UR 10/04/2021 None Detected  NONE DETECTED (Cut Off Level 300 ng/mL) Final   POC Methamphetamine UR 10/04/2021 None Detected  NONE DETECTED (Cut Off Level 1000 ng/mL) Final   POC Morphine 10/04/2021 None Detected  NONE DETECTED (Cut Off Level 300 ng/mL) Final   POC Methadone UR 10/04/2021 None Detected  NONE DETECTED (Cut Off Level 300 ng/mL) Final   POC Oxycodone UR 10/04/2021 Positive (A)  NONE DETECTED (Cut Off Level 100 ng/mL) Final   POC Marijuana UR 10/04/2021 None Detected  NONE DETECTED (Cut Off Level 50 ng/mL) Final   SARSCOV2ONAVIRUS 2 AG 10/04/2021 NEGATIVE  NEGATIVE Final   Comment: (NOTE) SARS-CoV-2 antigen NOT DETECTED.   Negative results are presumptive.  Negative results do not preclude SARS-CoV-2 infection and should not be used as the sole basis for treatment or other patient management decisions, including infection  control decisions, particularly in the presence of clinical signs and  symptoms consistent with COVID-19, or in those who have been in contact with the virus.  Negative results must be combined with clinical observations, patient history, and epidemiological information. The  expected result is Negative.  Fact Sheet for Patients: HandmadeRecipes.com.cy  Fact Sheet for Healthcare Providers: FuneralLife.at  This test is not yet approved or cleared by the [place] FDA and  has been authorized for detection and/or diagnosis of SARS-CoV-2 by FDA under an Emergency Use Authorization (EUA).  This EUA will remain in effect (meaning this test can be used) for the duration of  the COV                          ID-19 declaration under Section 564(b)(1) of the Act, 21 U.S.C. section 360bbb-3(b)(1), unless the authorization is terminated or revoked sooner.     Valproic Acid Lvl 10/04/2021 51  50.0 - 100.0 ug/mL Final   Performed at [facility] [address] [place], [place] [address]   Valproic Acid Lvl 10/08/2021 57  50.0 - 100.0 ug/mL Final   Performed at [facility] [address], [place], [place] [address]   Glucose-Capillary 10/09/2021 123 (H)  70 - 99 mg/dL Final   Glucose reference range applies only to samples taken after fasting for at least 8 hours.  Admission on 09/09/2021, Discharged on 09/10/2021  Component Date Value Ref Range Status   Sodium 09/09/2021 134 (L)  135 - 145 mmol/L Final   Potassium 09/09/2021 4.3  3.5 - 5.1 mmol/L Final   Chloride 09/09/2021 99  98 - 111 mmol/L Final   CO2 09/09/2021 25  22 - 32 mmol/L Final   Glucose, Bld 09/09/2021 107 (H)  70 - 99 mg/dL Final   Glucose reference range applies only to samples taken after fasting for at least 8 hours.   BUN 09/09/2021 12  6 - 20 mg/dL Final   Creatinine, Ser 09/09/2021 0.74  0.44 - 1.00 mg/dL Final   Calcium 09/09/2021 9.0  8.9 - 10.3 mg/dL Final   Total Protein 09/09/2021 7.4  6.5 - 8.1 g/dL Final   Albumin 09/09/2021 3.1 (L)  3.5 - 5.0 g/dL Final   AST 09/09/2021 13 (L)  15 - 41 U/L Final   ALT 09/09/2021 13  0 - 44 U/L Final   Alkaline Phosphatase 09/09/2021 66  38 - 126 U/L Final   Total Bilirubin 09/09/2021  0.2 (L)  0.3 - 1.2 mg/dL Final  GFR, Estimated 09/09/2021 >60  >60 mL/min Final   Comment: (NOTE) Calculated using the CKD-EPI Creatinine Equation (2021)    Anion gap 09/09/2021 10  5 - 15 Final   Performed at [facility], [place] [address], [place], [place] [address]   WBC 09/09/2021 8.5  4.0 - 10.5 K/uL Final   RBC 09/09/2021 4.53  3.87 - 5.11 MIL/uL Final   Hemoglobin 09/09/2021 11.8 (L)  12.0 - 15.0 g/dL Final   HCT 09/09/2021 38.0  36.0 - 46.0 % Final   MCV 09/09/2021 83.9  80.0 - 100.0 fL Final   MCH 09/09/2021 26.0  26.0 - 34.0 pg Final   MCHC 09/09/2021 31.1  30.0 - 36.0 g/dL Final   RDW 09/09/2021 17.8 (H)  11.5 - 15.5 % Final   Platelets 09/09/2021 442 (H)  150 - 400 K/uL Final   nRBC 09/09/2021 0.0  0.0 - 0.2 % Final   Neutrophils Relative % 09/09/2021 70  % Final   Neutro Abs 09/09/2021 5.9  1.7 - 7.7 K/uL Final   Lymphocytes Relative 09/09/2021 20  % Final   Lymphs Abs 09/09/2021 1.7  0.7 - 4.0 K/uL Final   Monocytes Relative 09/09/2021 8  % Final   Monocytes Absolute 09/09/2021 0.7  0.1 - 1.0 K/uL Final   Eosinophils Relative 09/09/2021 1  % Final   Eosinophils Absolute 09/09/2021 0.1  0.0 - 0.5 K/uL Final   Basophils Relative 09/09/2021 1  % Final   Basophils Absolute 09/09/2021 0.1  0.0 - 0.1 K/uL Final   Immature Granulocytes 09/09/2021 0  % Final   Abs Immature Granulocytes 09/09/2021 0.03  0.00 - 0.07 K/uL Final   Performed at [facility], [place] [address] [place] [address], [place], [place] [address]   Ammonia 09/09/2021 37 (H)  9 - 35 umol/L Final   Comment: HEMOLYSIS AT THIS LEVEL MAY AFFECT RESULT Performed at [facility], [place] [address], [place], [place] [address]    Opiates 09/09/2021 NONE DETECTED  NONE DETECTED Final   Cocaine 09/09/2021 NONE DETECTED  NONE DETECTED Final   Benzodiazepines 09/09/2021 NONE DETECTED  NONE DETECTED Final   Amphetamines 09/09/2021 NONE DETECTED  NONE DETECTED Final   Tetrahydrocannabinol 09/09/2021 NONE DETECTED  NONE  DETECTED Final   Barbiturates 09/09/2021 NONE DETECTED  NONE DETECTED Final   Comment: (NOTE) DRUG SCREEN FOR MEDICAL PURPOSES ONLY.  IF CONFIRMATION IS NEEDED FOR ANY PURPOSE, NOTIFY LAB WITHIN 5 DAYS.  LOWEST DETECTABLE LIMITS FOR URINE DRUG SCREEN Drug Class                     Cutoff (ng/mL) Amphetamine and metabolites    1000 Barbiturate and metabolites    200 Benzodiazepine                 976 Tricyclics and metabolites     300 Opiates and metabolites        300 Cocaine and metabolites        300 THC                            50 Performed at [facility], [place] [address], [place], [place] [address]    Alcohol, Ethyl (B) 09/09/2021 <10  <10 mg/dL Final   Comment: (NOTE) Lowest detectable limit for serum alcohol is 10 mg/dL.  For medical purposes only. Performed at [facility], [place] [address] [place], [place]  [address]    Lipase 09/09/2021 33  11 - 51 U/L Final   Performed at [facility] [address] [place], [place] [address]   Color, Urine 09/09/2021 COLORLESS (A)  YELLOW Final   APPearance 09/09/2021 CLEAR  CLEAR Final   Specific Gravity, Urine 09/09/2021 1.002 (L)  1.005 - 1.030 Final   pH 09/09/2021 6.0  5.0 - 8.0 Final   Glucose, UA 09/09/2021 NEGATIVE  NEGATIVE mg/dL Final   Hgb urine dipstick 09/09/2021 NEGATIVE  NEGATIVE Final   Bilirubin Urine 09/09/2021 NEGATIVE  NEGATIVE Final   Ketones, ur 09/09/2021 NEGATIVE  NEGATIVE mg/dL Final   Protein, ur 09/09/2021 NEGATIVE  NEGATIVE mg/dL Final   Nitrite 09/09/2021 NEGATIVE  NEGATIVE Final   Leukocytes,Ua 09/09/2021 NEGATIVE  NEGATIVE Final   Performed at [facility] [address] [place], [place] [address]   Specimen Description 09/09/2021 URINE, CLEAN CATCH   Final   Special Requests 09/09/2021 NONE   Final   Culture 09/09/2021  (A)   Final                   Value:<10,000 COLONIES/mL INSIGNIFICANT GROWTH Performed at [facility], [place] [address] [place] [address], [place], [place]  [address]    Report Status 09/09/2021 09/10/2021 FINAL   Final   D-Dimer, Quant 09/09/2021 0.92 (H)  0.00 - 0.50 ug/mL-FEU Final   Comment: (NOTE) At the manufacturer cut-off value of 0.5 g/mL FEU, this assay has a negative predictive value of 95-100%.This assay is intended for use in conjunction with a clinical pretest probability (PTP) assessment model to exclude pulmonary embolism (PE) and deep venous thrombosis (DVT) in outpatients suspected of PE or DVT. Results should be correlated with clinical presentation. Performed at [facility], [place] [address] [place], [place] [address]    Troponin I (High Sensitivity) 09/09/2021 5  <18 ng/L Final   Comment: (NOTE) Elevated high sensitivity troponin I (hsTnI) values and significant  changes across serial measurements may suggest ACS but many other  chronic and acute conditions are known to elevate hsTnI results.  Refer to the "Links" section for chest pain algorithms and additional  guidance. Performed at [facility], [place] [address] [place], [place] [address]    BP 09/10/2021 135/83  mmHg Final   S' Lateral 09/10/2021 2.30  cm Final   Area-P 1/2 09/10/2021 5.58  cm2 Final    Blood Alcohol level:  Lab Results  Component Value Date   ETH <10 11/23/2021   ETH <10 57/84/6962    Metabolic Disorder Labs: Lab Results  Component Value Date   HGBA1C 6.7 (H) 12/28/2021   MPG 145.59 12/28/2021   MPG 154.2 10/04/2021   Lab Results  Component Value Date   PROLACTIN 3.8 (L) 11/23/2021   Lab Results  Component Value Date   CHOL 171 11/23/2021   TRIG 125 11/23/2021   HDL 65 11/23/2021   CHOLHDL 2.6 11/23/2021   VLDL 25 11/23/2021   LDLCALC 81 11/23/2021   LDLCALC 95 10/04/2021    Therapeutic Lab Levels: No results found for: "LITHIUM" Lab Results  Component Value Date   VALPROATE 33 (L) 01/18/2022   VALPROATE 33 (L) 12/28/2021   No results found for: "CBMZ"  Physical Findings   PHQ2-9    Flowsheet Row ED  [facility] 11/23/2021 in [facility]  PHQ-2 Total Score 2  PHQ-9 Total Score 3      Flowsheet Row ED from 01/23/2022 in  [facility] ED from 11/23/2021 in [facility] ED from 11/22/2021 in [facility] DEPT  C-SSRS RISK CATEGORY No Risk No Risk No Risk        Musculoskeletal  Strength & Muscle Tone: within normal limits Gait & Station: normal Patient leans: N/A  Psychiatric Specialty Exam  Presentation  General Appearance:  Appropriate for Environment; Casual  Eye Contact: Good  Speech: Clear and Coherent; Normal Rate  Speech Volume: Normal  Handedness: Right   Mood and Affect  Mood: Euthymic  Affect: Appropriate; Congruent   Thought Process  Thought Processes: Coherent; Goal Directed; Linear  Descriptions of Associations:Intact  Orientation:Full (Time, Place and Person)  Thought Content:Logical; WDL  Diagnosis of Schizophrenia or Schizoaffective disorder in past: Yes  Duration of Psychotic Symptoms: No data recorded  Hallucinations:Hallucinations: None  Ideas of Reference:None  Suicidal Thoughts:Suicidal Thoughts: No  Homicidal Thoughts:Homicidal Thoughts: No   Sensorium  Memory: Immediate Good; Recent Fair  Judgment: Intact  Insight: Present   Executive Functions  Concentration: Good  Attention Span: Good  Recall: Good  Fund of Knowledge: Fair  Language: Fair   Psychomotor Activity  Psychomotor Activity: Psychomotor Activity: Normal   Assets  Assets: Communication Skills; Desire for Improvement; Resilience   Sleep  Sleep: Sleep: Good   Nutritional Assessment (For OBS and FBC admissions only) Has the patient had a weight loss or gain of 10 pounds or more in the last 3 months?: No Has the patient had a decrease in food intake/or appetite?: No Does the patient have dental problems?: No Does the  patient have eating habits or behaviors that may be indicators of an eating disorder including binging or inducing vomiting?: No Has the patient recently lost weight without trying?: 0 Has the patient been eating poorly because of a decreased appetite?: 0 Malnutrition Screening Tool Score: 0    Physical Exam  Physical Exam Vitals and nursing note reviewed.  Constitutional:      Appearance: Normal appearance. She is well-developed.  HENT:     Head: Normocephalic and atraumatic.     Nose: Nose normal.  Cardiovascular:     Rate and Rhythm: Normal rate.  Pulmonary:     Effort: Pulmonary effort is normal.  Musculoskeletal:        General: Normal range of motion.     Cervical back: Normal range of motion.  Skin:    General: Skin is warm and dry.  Neurological:     Mental Status: She is alert and oriented to person, place, and time.  Psychiatric:        Attention and Perception: Attention and perception normal.        Mood and Affect: Mood and affect normal.        Speech: Speech normal.        Behavior: Behavior normal. Behavior is cooperative.        Thought Content: Thought content normal.        Cognition and Memory: Cognition normal.    Review of Systems  Constitutional: Negative.   HENT: Negative.    Eyes: Negative.   Respiratory: Negative.    Cardiovascular: Negative.   Gastrointestinal: Negative.   Genitourinary: Negative.   Musculoskeletal: Negative.   Skin: Negative.   Neurological: Negative.   Psychiatric/Behavioral: Negative.     Blood pressure 126/74, pulse 100, temperature 97.7 F (36.5 C), temperature source Oral, resp. rate 18, SpO2 94 %. There is no height or weight on file  to calculate BMI.  Treatment Plan Summary: Patient reviewed with Dr Hampton Abbot.  Daily contact with patient to assess and evaluate symptoms and progress in treatment  Lucky Rathke, FNP 01/27/2022 9:32 AM

## 2022-01-27 NOTE — ED Notes (Signed)
Pt resting quietly, breathing is even and unlabored.  Pt denies SI, HI, pain and AVH.  Will continue to monitor for safety.  

## 2022-01-27 NOTE — ED Notes (Signed)
Pt laying in bed calm and cooperative. No c/o pain or distress. Alert and orient x4. Will continue to monitor for safety

## 2022-01-28 DIAGNOSIS — F209 Schizophrenia, unspecified: Secondary | ICD-10-CM | POA: Diagnosis not present

## 2022-01-28 LAB — GLUCOSE, CAPILLARY
Glucose-Capillary: 127 mg/dL — ABNORMAL HIGH (ref 70–99)
Glucose-Capillary: 110 mg/dL — ABNORMAL HIGH (ref 70–99)
Glucose-Capillary: 167 mg/dL — ABNORMAL HIGH (ref 70–99)

## 2022-01-28 MED ORDER — GUAIFENESIN 100 MG/5ML PO LIQD: 15.0000 mL | Freq: Four times a day (QID) | ORAL | Status: DC | PRN

## 2022-01-28 MED ORDER — MENTHOL 3 MG MT LOZG
1.0000 | LOZENGE | Freq: Three times a day (TID) | OROMUCOSAL | Status: DC | PRN
  Administered 2022-01-28 – 2022-02-07 (×11): 3 mg via ORAL
  Filled 2022-01-28 (×4): qty 9

## 2022-01-28 NOTE — Progress Notes (Addendum)
LCSW Progress Note   LCSW facilitated today's treatment team meeting. In attendance were Ava [facility] Coordinator), Hansel Starling, LCSW, Teresa Murillo (patient), Denton Lank ([facility] Coordinator), Laurence Ferrari [facility] Coordinator Supervisor), Fuller Mandril (Chief Nursing Officer), and Sabino Donovan (Nursing Dept Director).    The issue of burden of responsibility for completing the PCP and submitting the TAR was discussed as the [facility] has refused to complete the paperwork for the reason that the pt has not been in their care but in the care of [facility].  With this reasoning, they believed the task of the PCP should have been given to the clinicians here at the [facility].  It is protocol for the clinical home, or potential clinical home, to complete to assess the needs of the patient and complete the PCP and submit the TAR for the services that they will be rendering.  This is not the responsibility of the [facility] or [facility].    At this time, [facility] will no longer be pursued, but [facility] has shown interest in meeting with the pt and review her case to assess if they will be a good fit for her.  [name] and [name] assessed the pt's regard for personal safety and adherence to facility rules meant to keep her safe, including those having to do with dietary restrictions.  This information will be used to help determine if any additional services will need to be put in place to ensure the pt's safety.  The Engineer, agricultural of [employer] of [address], [place], will be provided with the LCSW's email address to discuss a date and time to schedule a video call with the pt.  The CCA remains current, but a new PCP will be completed by [employer] [facility] if she is accepted.    Hansel Starling, MSW, LCSW [facility] [phone] phone [phone] fax

## 2022-01-28 NOTE — ED Provider Notes (Signed)
Behavioral Health Progress Note  Date and Time: 01/28/2022 9:46 AM Name: Teresa Murillo MRN:  [id number]  Subjective:  Patient states "I am fine, I slept better last night."  Per chart review: Teresa Murillo 61 y.o., female patient with a past psychiatric history of schizophrenia, aggressive behavior, and borderline intellectual disability who initially presented to [facility] on 11/23/2021 via [employer].  At that time patient had been dismissed from the group home due to multiple elopements.  Patient has remained at the [facility] while awaiting new placement.  Social work and DSS are involved.   Patient is reassessed, face-to-face, by nurse practitioner.  Teresa Murillo is seated in observation area upon my approach.  She is alert and oriented, pleasant and cooperative during assessment.  She presents with euthymic mood, congruent affect.   Teresa Murillo denies suicidal and homicidal ideation.  She contracts verbally for safety with this Probation officer.  She continues to deny auditory and visual hallucinations.  There is no evidence of delusional thought content and no indication that patient is responding to internal stimuli.   She endorses average sleep and appetite.  Patient's behavior is pleasant and cooperative at [facility] health.  She is compliant with medication and engages appropriately with peers.  Patient offered support and encouragement. She verbalizes understanding of treatment plan, verbalizes readiness to discharge once appropriate placement secured.    Diagnosis:  Final diagnoses:  Schizophrenia, unspecified type ([place])  At risk for self care deficit  Noncompliance  Type 2 diabetes mellitus without complication, unspecified whether long term insulin use ([place])    Total Time spent with patient: 20 minutes  Past Psychiatric History: see H & P Past Medical History:  Past Medical History:  Diagnosis Date   Borderline intellectual functioning 12/14/2021   On 12/14/2021: Appreciate  assistance from psychology consult. On the Wechsler Adult Intelligence Scale-4, Ms. Para achieved a full-scale IQ score of 73 and a percentile rank of 4 placing her in the borderline range of intellectual functioning.    Chronic obstructive pulmonary disease (COPD) (HCC)    Glaucoma    Hyperlipidemia    Hypertension    Iron deficiency    Schizoaffective disorder (HCC)    Type 2 diabetes mellitus (HCC)     Past Surgical History:  Procedure Laterality Date   TUBAL LIGATION     Family History:  Family History  Problem Relation Age of Onset   Breast cancer Maternal Grandmother    Family Psychiatric  History: see H & P Social History:  Social History   Substance and Sexual Activity  Alcohol Use Yes     Social History   Substance and Sexual Activity  Drug Use Not Currently    Social History   Socioeconomic History   Marital status: Divorced    Spouse name: Not on file   Number of children: Not on file   Years of education: Not on file   Highest education level: Not on file  Occupational History   Not on file  Tobacco Use   Smoking status: Every Day    Packs/day: 1.00    Types: Cigars, Cigarettes   Smokeless tobacco: Current  Vaping Use   Vaping Use: Never used  Substance and Sexual Activity   Alcohol use: Yes   Drug use: Not Currently   Sexual activity: Not Currently    Birth control/protection: Surgical  Other Topics Concern   Not on file  Social History Narrative   Not on file   Social Determinants of Health  Financial Resource Strain: Not on file  Food Insecurity: Not on file  Transportation Needs: Not on file  Physical Activity: Not on file  Stress: Not on file  Social Connections: Not on file   SDOH:  SDOH Screenings   Depression (PHQ2-9): Low Risk  (11/23/2021)  Tobacco Use: High Risk (01/26/2022)   Additional Social History:                         Sleep: Good  Appetite:  Fair  Current Medications:  Current  Facility-Administered Medications  Medication Dose Route Frequency Provider Last Rate Last Admin   acetaminophen (TYLENOL) tablet 650 mg  650 mg Oral Q6H PRN Revonda Humphrey, NP   650 mg at 01/25/22 2138   albuterol (VENTOLIN HFA) 108 (90 Base) MCG/ACT inhaler 2 puff  2 puff Inhalation Q6H PRN Revonda Humphrey, NP       alum & mag hydroxide-simeth (MAALOX/MYLANTA) 200-200-20 MG/5ML suspension 30 mL  30 mL Oral Q4H PRN Revonda Humphrey, NP       apixaban Arne Cleveland) tablet 5 mg  5 mg Oral BID Revonda Humphrey, NP   5 mg at 01/28/22 0810   ARIPiprazole (ABILIFY) tablet 10 mg  10 mg Oral Daily Revonda Humphrey, NP   10 mg at 01/28/22 0811   cloZAPine (CLOZARIL) tablet 50 mg  50 mg Oral Daily Revonda Humphrey, NP   50 mg at 01/28/22 0811   cloZAPine (CLOZARIL) tablet 75 mg  75 mg Oral QHS Revonda Humphrey, NP   75 mg at 01/27/22 2125   diltiazem (CARDIZEM CD) 24 hr capsule 240 mg  240 mg Oral Daily Revonda Humphrey, NP   240 mg at 01/28/22 0810   divalproex (DEPAKOTE ER) 24 hr tablet 500 mg  500 mg Oral BID Revonda Humphrey, NP   500 mg at 01/28/22 0811   fluticasone furoate-vilanterol (BREO ELLIPTA) 200-25 MCG/ACT 1 puff  1 puff Inhalation Daily Revonda Humphrey, NP   1 puff at 01/28/22 0811   haloperidol (HALDOL) tablet 10 mg  10 mg Oral TID PRN Revonda Humphrey, NP       hydrOXYzine (ATARAX) tablet 25 mg  25 mg Oral TID PRN Revonda Humphrey, NP   25 mg at 01/27/22 0156   insulin aspart (novoLOG) injection 0-9 Units  0-9 Units Subcutaneous TID WC Revonda Humphrey, NP   1 Units at 01/28/22 0817   insulin glargine-yfgn (SEMGLEE) injection 8 Units  8 Units Subcutaneous Q1200 Revonda Humphrey, NP   8 Units at 01/27/22 1121   latanoprost (XALATAN) 0.005 % ophthalmic solution 1 drop  1 drop Both Eyes QHS Thomes Lolling H, NP   1 drop at 01/26/22 2121   magnesium hydroxide (MILK OF MAGNESIA) suspension 30 mL  30 mL Oral Daily PRN Revonda Humphrey, NP       metFORMIN  (GLUCOPHAGE-XR) 24 hr tablet 1,000 mg  1,000 mg Oral BID WC Revonda Humphrey, NP   1,000 mg at 01/28/22 0867   nicotine (NICODERM CQ - dosed in mg/24 hr) patch 7 mg  7 mg Transdermal Daily Revonda Humphrey, NP   7 mg at 01/28/22 6195   traZODone (DESYREL) tablet 50 mg  50 mg Oral QHS PRN Revonda Humphrey, NP   50 mg at 01/27/22 0156   valbenazine (INGREZZA) capsule 40 mg  40 mg Oral q AM Revonda Humphrey, NP   40 mg  at 01/28/22 0630   Vitamin D (Ergocalciferol) (DRISDOL) 1.25 MG (50000 UNIT) capsule 50,000 Units  50,000 Units Oral Q Sharren Bridge, NP   50,000 Units at 01/28/22 8937   Current Outpatient Medications  Medication Sig Dispense Refill   Accu-Chek Softclix Lancets lancets Use as directed up to four times daily 100 each 0   apixaban (ELIQUIS) 5 MG TABS tablet Take 1 tablet (5 mg total) by mouth 2 (two) times daily. 60 tablet 0   ARIPiprazole (ABILIFY) 10 MG tablet Take 1 tablet (10 mg total) by mouth daily. 30 tablet 0   Blood Glucose Monitoring Suppl (ACCU-CHEK GUIDE) w/Device KIT Use as directed up to four times daily 1 kit 0   budesonide-formoterol (SYMBICORT) 160-4.5 MCG/ACT inhaler Inhale 2 puffs into the lungs in the morning and at bedtime.     Cholecalciferol (VITAMIN D3) 1.25 MG (50000 UT) CAPS Take 50,000 Units by mouth every Thursday.     cloZAPine (CLOZARIL) 25 MG tablet Take 3 tablets (75 mg total) by mouth at bedtime. 90 tablet 0   clozapine (CLOZARIL) 50 MG tablet Take 1 tablet (50 mg total) by mouth daily. 30 tablet 0   diltiazem (CARDIZEM CD) 240 MG 24 hr capsule Take 1 capsule (240 mg total) by mouth daily. (Patient not taking: Reported on 11/24/2021) 30 capsule 0   divalproex (DEPAKOTE ER) 500 MG 24 hr tablet Take 1 tablet (500 mg total) by mouth 2 (two) times daily. 60 tablet 0   glucose blood test strip Use as directed up to four times daily 50 each 0   haloperidol (HALDOL) 10 MG tablet Take 1 tablet (10 mg total) by mouth 3 (three) times daily as  needed for agitation (and psychotic symptoms).     INGREZZA 40 MG capsule Take 1 capsule (40 mg total) by mouth in the morning. 30 capsule 0   insulin aspart (NOVOLOG) 100 UNIT/ML FlexPen Before each meal 3 times a day, 140-199 - 2 units, 200-250 - 4 units, 251-299 - 6 units,  300-349 - 8 units,  350 or above 10 units. Insulin PEN if approved, provide syringes and needles if needed.Please switch to any approved short acting Insulin if needed. 15 mL 0   insulin glargine (LANTUS) 100 UNIT/ML Solostar Pen Inject 12 Units into the skin daily. 15 mL 0   Insulin Pen Needle 32G X 4 MM MISC Use 4 times a day with insulin, 1 month supply. 100 each 0   latanoprost (XALATAN) 0.005 % ophthalmic solution Place 1 drop into both eyes at bedtime.     metFORMIN (GLUCOPHAGE) 500 MG tablet Take 500 mg by mouth 2 (two) times daily with a meal.     nicotine (NICODERM CQ - DOSED IN MG/24 HR) 7 mg/24hr patch Place 1 patch (7 mg total) onto the skin daily. 28 patch 0   PROAIR HFA 108 (90 Base) MCG/ACT inhaler Inhale 2 puffs into the lungs every 6 (six) hours as needed for wheezing or shortness of breath.      Labs  Lab Results:  Admission on 01/24/2022  Component Date Value Ref Range Status   Glucose-Capillary 01/24/2022 143 (H)  70 - 99 mg/dL Final   Glucose reference range applies only to samples taken after fasting for at least 8 hours.   Glucose-Capillary 01/24/2022 120 (H)  70 - 99 mg/dL Final   Glucose reference range applies only to samples taken after fasting for at least 8 hours.   Glucose-Capillary 01/25/2022 122 (H)  70 - 99 mg/dL Final   Glucose reference range applies only to samples taken after fasting for at least 8 hours.   Glucose-Capillary 01/25/2022 190 (H)  70 - 99 mg/dL Final   Glucose reference range applies only to samples taken after fasting for at least 8 hours.   Glucose-Capillary 01/25/2022 163 (H)  70 - 99 mg/dL Final   Glucose reference range applies only to samples taken after fasting  for at least 8 hours.   WBC 01/26/2022 6.5  4.0 - 10.5 K/uL Final   RBC 01/26/2022 4.53  3.87 - 5.11 MIL/uL Final   Hemoglobin 01/26/2022 11.3 (L)  12.0 - 15.0 g/dL Final   HCT 01/26/2022 36.4  36.0 - 46.0 % Final   MCV 01/26/2022 80.4  80.0 - 100.0 fL Final   MCH 01/26/2022 24.9 (L)  26.0 - 34.0 pg Final   MCHC 01/26/2022 31.0  30.0 - 36.0 g/dL Final   RDW 01/26/2022 17.3 (H)  11.5 - 15.5 % Final   Platelets 01/26/2022 327  150 - 400 K/uL Final   nRBC 01/26/2022 0.0  0.0 - 0.2 % Final   Neutrophils Relative % 01/26/2022 60  % Final   Neutro Abs 01/26/2022 3.9  1.7 - 7.7 K/uL Final   Lymphocytes Relative 01/26/2022 31  % Final   Lymphs Abs 01/26/2022 2.0  0.7 - 4.0 K/uL Final   Monocytes Relative 01/26/2022 7  % Final   Monocytes Absolute 01/26/2022 0.5  0.1 - 1.0 K/uL Final   Eosinophils Relative 01/26/2022 1  % Final   Eosinophils Absolute 01/26/2022 0.1  0.0 - 0.5 K/uL Final   Basophils Relative 01/26/2022 1  % Final   Basophils Absolute 01/26/2022 0.1  0.0 - 0.1 K/uL Final   Immature Granulocytes 01/26/2022 0  % Final   Abs Immature Granulocytes 01/26/2022 0.02  0.00 - 0.07 K/uL Final   Performed at  [facility]ospital [facility], [place] [address], [place], [place] [address]   Glucose-Capillary 01/26/2022 149 (H)  70 - 99 mg/dL Final   Glucose reference range applies only to samples taken after fasting for at least 8 hours.   Glucose-Capillary 01/26/2022 130 (H)  70 - 99 mg/dL Final   Glucose reference range applies only to samples taken after fasting for at least 8 hours.   Glucose-Capillary 01/26/2022 151 (H)  70 - 99 mg/dL Final   Glucose reference range applies only to samples taken after fasting for at least 8 hours.   Glucose-Capillary 01/26/2022 170 (H)  70 - 99 mg/dL Final   Glucose reference range applies only to samples taken after fasting for at least 8 hours.   Glucose-Capillary 01/27/2022 129 (H)  70 - 99 mg/dL Final   Glucose reference range applies only to samples taken after  fasting for at least 8 hours.   Glucose-Capillary 01/27/2022 101 (H)  70 - 99 mg/dL Final   Glucose reference range applies only to samples taken after fasting for at least 8 hours.   Glucose-Capillary 01/27/2022 220 (H)  70 - 99 mg/dL Final   Glucose reference range applies only to samples taken after fasting for at least 8 hours.   Glucose-Capillary 01/27/2022 154 (H)  70 - 99 mg/dL Final   Glucose reference range applies only to samples taken after fasting for at least 8 hours.   Glucose-Capillary 01/27/2022 163 (H)  70 - 99 mg/dL Final   Glucose reference range applies only to samples taken after fasting for at least 8 hours.  Glucose-Capillary 01/28/2022 127 (H)  70 - 99 mg/dL Final   Glucose reference range applies only to samples taken after fasting for at least 8 hours.  Admission on 01/23/2022, Discharged on 01/24/2022  Component Date Value Ref Range Status   Sodium 01/23/2022 140  135 - 145 mmol/L Final   Potassium 01/23/2022 4.4  3.5 - 5.1 mmol/L Final   Chloride 01/23/2022 101  98 - 111 mmol/L Final   CO2 01/23/2022 25  22 - 32 mmol/L Final   Glucose, Bld 01/23/2022 198 (H)  70 - 99 mg/dL Final   Glucose reference range applies only to samples taken after fasting for at least 8 hours.   BUN 01/23/2022 13  6 - 20 mg/dL Final   Creatinine, Ser 01/23/2022 0.62  0.44 - 1.00 mg/dL Final   Calcium 01/23/2022 9.3  8.9 - 10.3 mg/dL Final   GFR, Estimated 01/23/2022 >60  >60 mL/min Final   Comment: (NOTE) Calculated using the CKD-EPI Creatinine Equation (2021)    Anion gap 01/23/2022 14  5 - 15 Final   Performed at [facility], [place] [address], [place], [place] [address]   WBC 01/23/2022 5.8  4.0 - 10.5 K/uL Final   RBC 01/23/2022 4.63  3.87 - 5.11 MIL/uL Final   Hemoglobin 01/23/2022 11.7 (L)  12.0 - 15.0 g/dL Final   HCT 01/23/2022 37.4  36.0 - 46.0 % Final   MCV 01/23/2022 80.8  80.0 - 100.0 fL Final   MCH 01/23/2022 25.3 (L)  26.0 - 34.0 pg Final   MCHC 01/23/2022 31.3   30.0 - 36.0 g/dL Final   RDW 01/23/2022 17.9 (H)  11.5 - 15.5 % Final   Platelets 01/23/2022 333  150 - 400 K/uL Final   nRBC 01/23/2022 0.0  0.0 - 0.2 % Final   Performed at [place] [address], [place], [place] [address]   Troponin I (High Sensitivity) 01/23/2022 6  <18 ng/L Final   Comment: (NOTE) Elevated high sensitivity troponin I (hsTnI) values and significant  changes across serial measurements may suggest ACS but many other  chronic and acute conditions are known to elevate hsTnI results.  Refer to the "Links" section for chest pain algorithms and additional  guidance. Performed at [facility], [place] [address], [place], [place] [address]    Troponin I (High Sensitivity) 01/23/2022 5  <18 ng/L Final   Comment: (NOTE) Elevated high sensitivity troponin I (hsTnI) values and significant  changes across serial measurements may suggest ACS but many other  chronic and acute conditions are known to elevate hsTnI results.  Refer to the "Links" section for chest pain algorithms and additional  guidance. Performed at [facility], [place] [address] [place], [place] [address]    SARS Coronavirus 2 by RT PCR 01/23/2022 NEGATIVE  NEGATIVE Final   Comment: (NOTE) SARS-CoV-2 target nucleic acids are NOT DETECTED.  The SARS-CoV-2 RNA is generally detectable in upper and lower respiratory specimens during the acute phase of infection. The lowest concentration of SARS-CoV-2 viral copies this assay can detect is 250 copies / mL. A negative result does not preclude SARS-CoV-2 infection and should not be used as the sole basis for treatment or other patient management decisions.  A negative result may occur with improper specimen collection / handling, submission of specimen other than nasopharyngeal swab, presence of viral mutation(s) within the areas targeted by this assay, and inadequate number of viral copies (<250 copies / mL). A  negative result must be  combined with clinical observations, patient history, and epidemiological information.  Fact Sheet for Patients:   https://www.patel.info/  Fact Sheet for Healthcare Providers: https://hall.com/  This test is not yet approved or                           cleared by the [place] FDA and has been authorized for detection and/or diagnosis of SARS-CoV-2 by FDA under an Emergency Use Authorization (EUA).  This EUA will remain in effect (meaning this test can be used) for the duration of the COVID-19 declaration under Section 564(b)(1) of the Act, 21 U.S.C. section 360bbb-3(b)(1), unless the authorization is terminated or revoked sooner.  Performed at [facility], [place] [address] [place], [place] [address]    B Natriuretic Peptide 01/23/2022 41.3  0.0 - 100.0 pg/mL Final   Performed at [place] [address] [place], [place] [address]   Group A Strep by PCR 01/23/2022 NOT DETECTED  NOT DETECTED Final   Performed at [facility], [place] [address] [place], [place] [address]   Color, Urine 01/23/2022 YELLOW  YELLOW Final   APPearance 01/23/2022 CLEAR  CLEAR Final   Specific Gravity, Urine 01/23/2022 1.018  1.005 - 1.030 Final   pH 01/23/2022 7.0  5.0 - 8.0 Final   Glucose, UA 01/23/2022 NEGATIVE  NEGATIVE mg/dL Final   Hgb urine dipstick 01/23/2022 NEGATIVE  NEGATIVE Final   Bilirubin Urine 01/23/2022 NEGATIVE  NEGATIVE Final   Ketones, ur 01/23/2022 NEGATIVE  NEGATIVE mg/dL Final   Protein, ur 01/23/2022 NEGATIVE  NEGATIVE mg/dL Final   Nitrite 01/23/2022 NEGATIVE  NEGATIVE Final   Leukocytes,Ua 01/23/2022 TRACE (A)  NEGATIVE Final   RBC / HPF 01/23/2022 0-5  0 - 5 RBC/hpf Final   WBC, UA 01/23/2022 0-5  0 - 5 WBC/hpf Final   Bacteria, UA 01/23/2022 NONE SEEN  NONE SEEN Final   Squamous Epithelial / LPF 01/23/2022 0-5  0 - 5 Final   Mucus 01/23/2022 PRESENT   Final   Performed at [facility], [place]  [address], [place], [place] [address]   SARS Coronavirus 2 by RT PCR 01/23/2022 NEGATIVE  NEGATIVE Final   Comment: (NOTE) SARS-CoV-2 target nucleic acids are NOT DETECTED.  The SARS-CoV-2 RNA is generally detectable in upper respiratory specimens during the acute phase of infection. The lowest concentration of SARS-CoV-2 viral copies this assay can detect is 138 copies/mL. A negative result does not preclude SARS-Cov-2 infection and should not be used as the sole basis for treatment or other patient management decisions. A negative result may occur with  improper specimen collection/handling, submission of specimen other than nasopharyngeal swab, presence of viral mutation(s) within the areas targeted by this assay, and inadequate number of viral copies(<138 copies/mL). A negative result must be combined with clinical observations, patient history, and epidemiological information. The expected result is Negative.  Fact Sheet for Patients:  EntrepreneurPulse.com.au  Fact Sheet for Healthcare Providers:  IncredibleEmployment.be  This test is no                          t yet approved or cleared by the [place] FDA and  has been authorized for detection and/or diagnosis of SARS-CoV-2 by FDA under an Emergency Use Authorization (EUA). This EUA will remain  in effect (meaning this test can be used) for the duration of the  COVID-19 declaration under Section 564(b)(1) of the Act, 21 U.S.C.section 360bbb-3(b)(1), unless the authorization is terminated  or revoked sooner.       Influenza A by PCR 01/23/2022 NEGATIVE  NEGATIVE Final   Influenza B by PCR 01/23/2022 NEGATIVE  NEGATIVE Final   Comment: (NOTE) The Xpert Xpress SARS-CoV-2/FLU/RSV plus assay is intended as an aid in the diagnosis of influenza from Nasopharyngeal swab specimens and should not be used as a sole basis for treatment. Nasal washings and aspirates are unacceptable for Xpert Xpress  SARS-CoV-2/FLU/RSV testing.  Fact Sheet for Patients: EntrepreneurPulse.com.au  Fact Sheet for Healthcare Providers: IncredibleEmployment.be  This test is not yet approved or cleared by the [place] FDA and has been authorized for detection and/or diagnosis of SARS-CoV-2 by FDA under an Emergency Use Authorization (EUA). This EUA will remain in effect (meaning this test can be used) for the duration of the COVID-19 declaration under Section 564(b)(1) of the Act, 21 U.S.C. section 360bbb-3(b)(1), unless the authorization is terminated or revoked.  Performed at [facility], [place] [address], [place], [place] [address]    WBC 01/24/2022 6.1  4.0 - 10.5 K/uL Final   RBC 01/24/2022 4.60  3.87 - 5.11 MIL/uL Final   Hemoglobin 01/24/2022 11.7 (L)  12.0 - 15.0 g/dL Final   HCT 01/24/2022 37.0  36.0 - 46.0 % Final   MCV 01/24/2022 80.4  80.0 - 100.0 fL Final   MCH 01/24/2022 25.4 (L)  26.0 - 34.0 pg Final   MCHC 01/24/2022 31.6  30.0 - 36.0 g/dL Final   RDW 01/24/2022 17.6 (H)  11.5 - 15.5 % Final   Platelets 01/24/2022 334  150 - 400 K/uL Final   nRBC 01/24/2022 0.0  0.0 - 0.2 % Final   Neutrophils Relative % 01/24/2022 53  % Final   Neutro Abs 01/24/2022 3.3  1.7 - 7.7 K/uL Final   Lymphocytes Relative 01/24/2022 33  % Final   Lymphs Abs 01/24/2022 2.0  0.7 - 4.0 K/uL Final   Monocytes Relative 01/24/2022 11  % Final   Monocytes Absolute 01/24/2022 0.7  0.1 - 1.0 K/uL Final   Eosinophils Relative 01/24/2022 2  % Final   Eosinophils Absolute 01/24/2022 0.1  0.0 - 0.5 K/uL Final   Basophils Relative 01/24/2022 1  % Final   Basophils Absolute 01/24/2022 0.1  0.0 - 0.1 K/uL Final   Immature Granulocytes 01/24/2022 0  % Final   Abs Immature Granulocytes 01/24/2022 0.02  0.00 - 0.07 K/uL Final   Performed at [facility], [place] [address], [place],  [address]   Glucose-Capillary 01/24/2022 110 (H)  70 - 99 mg/dL Final   Glucose  reference range applies only to samples taken after fasting for at least 8 hours.  No results displayed because visit has over 200 results.    Admission on 11/22/2021, Discharged on 11/22/2021  Component Date Value Ref Range Status   Glucose-Capillary 11/22/2021 169 (H)  70 - 99 mg/dL Final   Glucose reference range applies only to samples taken after fasting for at least 8 hours.  No results displayed because visit has over 200 results.    Admission on 10/04/2021, Discharged on 10/22/2021  Component Date Value Ref Range Status   SARS Coronavirus 2 by RT PCR 10/04/2021 NEGATIVE  NEGATIVE Final   Comment: (NOTE) SARS-CoV-2 target nucleic acids are NOT DETECTED.  The SARS-CoV-2 RNA is generally detectable in upper respiratory specimens during the acute phase of infection. The lowest concentration  of SARS-CoV-2 viral copies this assay can detect is 138 copies/mL. A negative result does not preclude SARS-Cov-2 infection and should not be used as the sole basis for treatment or other patient management decisions. A negative result may occur with  improper specimen collection/handling, submission of specimen other than nasopharyngeal swab, presence of viral mutation(s) within the areas targeted by this assay, and inadequate number of viral copies(<138 copies/mL). A negative result must be combined with clinical observations, patient history, and epidemiological information. The expected result is Negative.  Fact Sheet for Patients:  EntrepreneurPulse.com.au  Fact Sheet for Healthcare Providers:  IncredibleEmployment.be  This test is no                          t yet approved or cleared by the [place] FDA and  has been authorized for detection and/or diagnosis of SARS-CoV-2 by FDA under an Emergency Use Authorization (EUA). This EUA will remain  in effect (meaning this test can be used) for the duration of the COVID-19 declaration under Section  564(b)(1) of the Act, 21 U.S.C.section 360bbb-3(b)(1), unless the authorization is terminated  or revoked sooner.       Influenza A by PCR 10/04/2021 NEGATIVE  NEGATIVE Final   Influenza B by PCR 10/04/2021 NEGATIVE  NEGATIVE Final   Comment: (NOTE) The Xpert Xpress SARS-CoV-2/FLU/RSV plus assay is intended as an aid in the diagnosis of influenza from Nasopharyngeal swab specimens and should not be used as a sole basis for treatment. Nasal washings and aspirates are unacceptable for Xpert Xpress SARS-CoV-2/FLU/RSV testing.  Fact Sheet for Patients: EntrepreneurPulse.com.au  Fact Sheet for Healthcare Providers: IncredibleEmployment.be  This test is not yet approved or cleared by the [place] FDA and has been authorized for detection and/or diagnosis of SARS-CoV-2 by FDA under an Emergency Use Authorization (EUA). This EUA will remain in effect (meaning this test can be used) for the duration of the COVID-19 declaration under Section 564(b)(1) of the Act, 21 U.S.C. section 360bbb-3(b)(1), unless the authorization is terminated or revoked.  Performed at [facility], [place] [address], [place], [place] [address]    WBC 10/04/2021 8.4  4.0 - 10.5 K/uL Final   RBC 10/04/2021 4.42  3.87 - 5.11 MIL/uL Final   Hemoglobin 10/04/2021 11.6 (L)  12.0 - 15.0 g/dL Final   HCT 10/04/2021 35.7 (L)  36.0 - 46.0 % Final   MCV 10/04/2021 80.8  80.0 - 100.0 fL Final   MCH 10/04/2021 26.2  26.0 - 34.0 pg Final   MCHC 10/04/2021 32.5  30.0 - 36.0 g/dL Final   RDW 10/04/2021 16.0 (H)  11.5 - 15.5 % Final   Platelets 10/04/2021 372  150 - 400 K/uL Final   nRBC 10/04/2021 0.0  0.0 - 0.2 % Final   Neutrophils Relative % 10/04/2021 68  % Final   Neutro Abs 10/04/2021 5.7  1.7 - 7.7 K/uL Final   Lymphocytes Relative 10/04/2021 22  % Final   Lymphs Abs 10/04/2021 1.8  0.7 - 4.0 K/uL Final   Monocytes Relative 10/04/2021 8  % Final   Monocytes Absolute  10/04/2021 0.7  0.1 - 1.0 K/uL Final   Eosinophils Relative 10/04/2021 1  % Final   Eosinophils Absolute 10/04/2021 0.1  0.0 - 0.5 K/uL Final   Basophils Relative 10/04/2021 1  % Final   Basophils Absolute 10/04/2021 0.1  0.0 - 0.1 K/uL Final   Immature Granulocytes 10/04/2021 0  % Final  Abs Immature Granulocytes 10/04/2021 0.03  0.00 - 0.07 K/uL Final   Performed at [facility] [address] [place], [place] [address]   Sodium 10/04/2021 136  135 - 145 mmol/L Final   Potassium 10/04/2021 4.2  3.5 - 5.1 mmol/L Final   Chloride 10/04/2021 104  98 - 111 mmol/L Final   CO2 10/04/2021 25  22 - 32 mmol/L Final   Glucose, Bld 10/04/2021 100 (H)  70 - 99 mg/dL Final   Glucose reference range applies only to samples taken after fasting for at least 8 hours.   BUN 10/04/2021 9  6 - 20 mg/dL Final   Creatinine, Ser 10/04/2021 0.52  0.44 - 1.00 mg/dL Final   Calcium 10/04/2021 9.0  8.9 - 10.3 mg/dL Final   Total Protein 10/04/2021 7.0  6.5 - 8.1 g/dL Final   Albumin 10/04/2021 3.2 (L)  3.5 - 5.0 g/dL Final   AST 10/04/2021 13 (L)  15 - 41 U/L Final   ALT 10/04/2021 10  0 - 44 U/L Final   Alkaline Phosphatase 10/04/2021 61  38 - 126 U/L Final   Total Bilirubin 10/04/2021 0.3  0.3 - 1.2 mg/dL Final   GFR, Estimated 10/04/2021 >60  >60 mL/min Final   Comment: (NOTE) Calculated using the CKD-EPI Creatinine Equation (2021)    Anion gap 10/04/2021 7  5 - 15 Final   Performed at [facility] [address], [place], [place] [address]   Hgb A1c MFr Bld 10/04/2021 7.0 (H)  4.8 - 5.6 % Final   Comment: (NOTE) Pre diabetes:          5.7%-6.4%  Diabetes:              >6.4%  Glycemic control for   <7.0% adults with diabetes    Mean Plasma Glucose 10/04/2021 154.2  mg/dL Final   Performed at [facility], [place] [address] [place], [place] [address]   Cholesterol 10/04/2021 178  0 - 200 mg/dL Final   Triglycerides 10/04/2021 155 (H)  <150 mg/dL Final   HDL 10/04/2021 52  >40  mg/dL Final   Total CHOL/HDL Ratio 10/04/2021 3.4  RATIO Final   VLDL 10/04/2021 31  0 - 40 mg/dL Final   LDL Cholesterol 10/04/2021 95  0 - 99 mg/dL Final   Comment:        Total Cholesterol/HDL:CHD Risk Coronary Heart Disease Risk Table                     Men   Women  1/2 Average Risk   3.4   3.3  Average Risk       5.0   4.4  2 X Average Risk   9.6   7.1  3 X Average Risk  23.4   11.0        Use the calculated Patient Ratio above and the CHD Risk Table to determine the patient's CHD Risk.        ATP III CLASSIFICATION (LDL):  <100     mg/dL   Optimal  100-129  mg/dL   Near or Above                    Optimal  130-159  mg/dL   Borderline  160-189  mg/dL   High  >190     mg/dL   Very High Performed at [facility] [address], [place], [place] [address]  POC Amphetamine UR 10/04/2021 None Detected  NONE DETECTED (Cut Off Level 1000 ng/mL) Final   POC Secobarbital (BAR) 10/04/2021 None Detected  NONE DETECTED (Cut Off Level 300 ng/mL) Final   POC Buprenorphine (BUP) 10/04/2021 None Detected  NONE DETECTED (Cut Off Level 10 ng/mL) Final   POC Oxazepam (BZO) 10/04/2021 None Detected  NONE DETECTED (Cut Off Level 300 ng/mL) Final   POC Cocaine UR 10/04/2021 None Detected  NONE DETECTED (Cut Off Level 300 ng/mL) Final   POC Methamphetamine UR 10/04/2021 None Detected  NONE DETECTED (Cut Off Level 1000 ng/mL) Final   POC Morphine 10/04/2021 None Detected  NONE DETECTED (Cut Off Level 300 ng/mL) Final   POC Methadone UR 10/04/2021 None Detected  NONE DETECTED (Cut Off Level 300 ng/mL) Final   POC Oxycodone UR 10/04/2021 Positive (A)  NONE DETECTED (Cut Off Level 100 ng/mL) Final   POC Marijuana UR 10/04/2021 None Detected  NONE DETECTED (Cut Off Level 50 ng/mL) Final   SARSCOV2ONAVIRUS 2 AG 10/04/2021 NEGATIVE  NEGATIVE Final   Comment: (NOTE) SARS-CoV-2 antigen NOT DETECTED.   Negative results are presumptive.  Negative results do not preclude SARS-CoV-2 infection and  should not be used as the sole basis for treatment or other patient management decisions, including infection  control decisions, particularly in the presence of clinical signs and  symptoms consistent with COVID-19, or in those who have been in contact with the virus.  Negative results must be combined with clinical observations, patient history, and epidemiological information. The expected result is Negative.  Fact Sheet for Patients: HandmadeRecipes.com.cy  Fact Sheet for Healthcare Providers: FuneralLife.at  This test is not yet approved or cleared by the [place] FDA and  has been authorized for detection and/or diagnosis of SARS-CoV-2 by FDA under an Emergency Use Authorization (EUA).  This EUA will remain in effect (meaning this test can be used) for the duration of  the COV                          ID-19 declaration under Section 564(b)(1) of the Act, 21 U.S.C. section 360bbb-3(b)(1), unless the authorization is terminated or revoked sooner.     Valproic Acid Lvl 10/04/2021 51  50.0 - 100.0 ug/mL Final   Performed at [facility] [address], [place], [place] [address]   Valproic Acid Lvl 10/08/2021 57  50.0 - 100.0 ug/mL Final   Performed at [facility] [address], [place], [place] [address]   Glucose-Capillary 10/09/2021 123 (H)  70 - 99 mg/dL Final   Glucose reference range applies only to samples taken after fasting for at least 8 hours.  Admission on 09/09/2021, Discharged on 09/10/2021  Component Date Value Ref Range Status   Sodium 09/09/2021 134 (L)  135 - 145 mmol/L Final   Potassium 09/09/2021 4.3  3.5 - 5.1 mmol/L Final   Chloride 09/09/2021 99  98 - 111 mmol/L Final   CO2 09/09/2021 25  22 - 32 mmol/L Final   Glucose, Bld 09/09/2021 107 (H)  70 - 99 mg/dL Final   Glucose reference range applies only to samples taken after fasting for at least 8 hours.   BUN 09/09/2021 12  6 - 20 mg/dL  Final   Creatinine, Ser 09/09/2021 0.74  0.44 - 1.00 mg/dL Final   Calcium 09/09/2021 9.0  8.9 - 10.3 mg/dL Final   Total Protein 09/09/2021 7.4  6.5 - 8.1 g/dL Final  Albumin 09/09/2021 3.1 (L)  3.5 - 5.0 g/dL Final   AST 09/09/2021 13 (L)  15 - 41 U/L Final   ALT 09/09/2021 13  0 - 44 U/L Final   Alkaline Phosphatase 09/09/2021 66  38 - 126 U/L Final   Total Bilirubin 09/09/2021 0.2 (L)  0.3 - 1.2 mg/dL Final   GFR, Estimated 09/09/2021 >60  >60 mL/min Final   Comment: (NOTE) Calculated using the CKD-EPI Creatinine Equation (2021)    Anion gap 09/09/2021 10  5 - 15 Final   Performed at [facility] [address] [place], [place] [address]   WBC 09/09/2021 8.5  4.0 - 10.5 K/uL Final   RBC 09/09/2021 4.53  3.87 - 5.11 MIL/uL Final   Hemoglobin 09/09/2021 11.8 (L)  12.0 - 15.0 g/dL Final   HCT 09/09/2021 38.0  36.0 - 46.0 % Final   MCV 09/09/2021 83.9  80.0 - 100.0 fL Final   MCH 09/09/2021 26.0  26.0 - 34.0 pg Final   MCHC 09/09/2021 31.1  30.0 - 36.0 g/dL Final   RDW 09/09/2021 17.8 (H)  11.5 - 15.5 % Final   Platelets 09/09/2021 442 (H)  150 - 400 K/uL Final   nRBC 09/09/2021 0.0  0.0 - 0.2 % Final   Neutrophils Relative % 09/09/2021 70  % Final   Neutro Abs 09/09/2021 5.9  1.7 - 7.7 K/uL Final   Lymphocytes Relative 09/09/2021 20  % Final   Lymphs Abs 09/09/2021 1.7  0.7 - 4.0 K/uL Final   Monocytes Relative 09/09/2021 8  % Final   Monocytes Absolute 09/09/2021 0.7  0.1 - 1.0 K/uL Final   Eosinophils Relative 09/09/2021 1  % Final   Eosinophils Absolute 09/09/2021 0.1  0.0 - 0.5 K/uL Final   Basophils Relative 09/09/2021 1  % Final   Basophils Absolute 09/09/2021 0.1  0.0 - 0.1 K/uL Final   Immature Granulocytes 09/09/2021 0  % Final   Abs Immature Granulocytes 09/09/2021 0.03  0.00 - 0.07 K/uL Final   Performed at [facility], [place] [address], [place], [place] [address]   Ammonia 09/09/2021 37 (H)  9 - 35 umol/L Final   Comment: HEMOLYSIS AT THIS LEVEL MAY  AFFECT RESULT Performed at [facility], [place] [address], [place], [place] [address]    Opiates 09/09/2021 NONE DETECTED  NONE DETECTED Final   Cocaine 09/09/2021 NONE DETECTED  NONE DETECTED Final   Benzodiazepines 09/09/2021 NONE DETECTED  NONE DETECTED Final   Amphetamines 09/09/2021 NONE DETECTED  NONE DETECTED Final   Tetrahydrocannabinol 09/09/2021 NONE DETECTED  NONE DETECTED Final   Barbiturates 09/09/2021 NONE DETECTED  NONE DETECTED Final   Comment: (NOTE) DRUG SCREEN FOR MEDICAL PURPOSES ONLY.  IF CONFIRMATION IS NEEDED FOR ANY PURPOSE, NOTIFY LAB WITHIN 5 DAYS.  LOWEST DETECTABLE LIMITS FOR URINE DRUG SCREEN Drug Class                     Cutoff (ng/mL) Amphetamine and metabolites    1000 Barbiturate and metabolites    200 Benzodiazepine                 401 Tricyclics and metabolites     300 Opiates and metabolites        300 Cocaine and metabolites        300 THC  50 Performed at [facility], [place] [address], [place], [place] [address]    Alcohol, Ethyl (B) 09/09/2021 <10  <10 mg/dL Final   Comment: (NOTE) Lowest detectable limit for serum alcohol is 10 mg/dL.  For medical purposes only. Performed at [facility], [place] [address], [place], [place] [address]    Lipase 09/09/2021 33  11 - 51 U/L Final   Performed at [facility] [address], [place], [place] [address]   Color, Urine 09/09/2021 COLORLESS (A)  YELLOW Final   APPearance 09/09/2021 CLEAR  CLEAR Final   Specific Gravity, Urine 09/09/2021 1.002 (L)  1.005 - 1.030 Final   pH 09/09/2021 6.0  5.0 - 8.0 Final   Glucose, UA 09/09/2021 NEGATIVE  NEGATIVE mg/dL Final   Hgb urine dipstick 09/09/2021 NEGATIVE  NEGATIVE Final   Bilirubin Urine 09/09/2021 NEGATIVE  NEGATIVE Final   Ketones, ur 09/09/2021 NEGATIVE  NEGATIVE mg/dL Final   Protein, ur 09/09/2021 NEGATIVE  NEGATIVE mg/dL Final   Nitrite 09/09/2021 NEGATIVE  NEGATIVE Final   Leukocytes,Ua  09/09/2021 NEGATIVE  NEGATIVE Final   Performed at [facility] [address] [place], [place] [address]   Specimen Description 09/09/2021 URINE, CLEAN CATCH   Final   Special Requests 09/09/2021 NONE   Final   Culture 09/09/2021  (A)   Final                   Value:<10,000 COLONIES/mL INSIGNIFICANT GROWTH Performed at [facility], [place] [address] [place], [place] [address]    Report Status 09/09/2021 09/10/2021 FINAL   Final   D-Dimer, Quant 09/09/2021 0.92 (H)  0.00 - 0.50 ug/mL-FEU Final   Comment: (NOTE) At the manufacturer cut-off value of 0.5 g/mL FEU, this assay has a negative predictive value of 95-100%.This assay is intended for use in conjunction with a clinical pretest probability (PTP) assessment model to exclude pulmonary embolism (PE) and deep venous thrombosis (DVT) in outpatients suspected of PE or DVT. Results should be correlated with clinical presentation. Performed at [facility], [place] [address] [place], [place] [address]    Troponin I (High Sensitivity) 09/09/2021 5  <18 ng/L Final   Comment: (NOTE) Elevated high sensitivity troponin I (hsTnI) values and significant  changes across serial measurements may suggest ACS but many other  chronic and acute conditions are known to elevate hsTnI results.  Refer to the "Links" section for chest pain algorithms and additional  guidance. Performed at [facility], [place] [address] [place], [place] [address]    BP 09/10/2021 135/83  mmHg Final   S' Lateral 09/10/2021 2.30  cm Final   Area-P 1/2 09/10/2021 5.58  cm2 Final    Blood Alcohol level:  Lab Results  Component Value Date   ETH <10 11/23/2021   ETH <10 62/22/9798    Metabolic Disorder Labs: Lab Results  Component Value Date   HGBA1C 6.7 (H) 12/28/2021   MPG 145.59 12/28/2021   MPG 154.2 10/04/2021   Lab Results  Component Value Date   PROLACTIN 3.8 (L) 11/23/2021   Lab Results  Component Value Date   CHOL 171  11/23/2021   TRIG 125 11/23/2021   HDL 65 11/23/2021   CHOLHDL 2.6 11/23/2021   VLDL 25 11/23/2021   LDLCALC 81 11/23/2021   LDLCALC 95 10/04/2021    Therapeutic Lab Levels: No results found for: "LITHIUM" Lab Results  Component Value Date   VALPROATE 33 (  L) 01/18/2022   VALPROATE 33 (L) 12/28/2021   No results found for: "CBMZ"  Physical Findings   PHQ2-9    Flowsheet Row ED from 11/23/2021 in [facility]  PHQ-2 Total Score 2  PHQ-9 Total Score 3      Flowsheet Row ED from 01/23/2022 in [facility] from 11/23/2021 in [facility] ED from 11/22/2021 in [facility] DEPT  C-SSRS RISK CATEGORY No Risk No Risk No Risk        Musculoskeletal  Strength & Muscle Tone: within normal limits Gait & Station: normal Patient leans: N/A  Psychiatric Specialty Exam  Presentation  General Appearance:  Appropriate for Environment; Casual  Eye Contact: Good  Speech: Clear and Coherent; Normal Rate  Speech Volume: Normal  Handedness: Right   Mood and Affect  Mood: Euthymic  Affect: Appropriate; Congruent   Thought Process  Thought Processes: Coherent; Goal Directed; Linear  Descriptions of Associations:Intact  Orientation:Full (Time, Place and Person)  Thought Content:Logical; WDL  Diagnosis of Schizophrenia or Schizoaffective disorder in past: Yes  Duration of Psychotic Symptoms: No data recorded  Hallucinations:Hallucinations: None  Ideas of Reference:None  Suicidal Thoughts:Suicidal Thoughts: No  Homicidal Thoughts:Homicidal Thoughts: No   Sensorium  Memory: Immediate Good; Recent Fair  Judgment: Intact  Insight: Present   Executive Functions  Concentration: Good  Attention Span: Good  Recall: Good  Fund of Knowledge: Fair  Language: Fair   Psychomotor Activity  Psychomotor Activity: Psychomotor  Activity: Normal   Assets  Assets: Communication Skills; Desire for Improvement; Resilience   Sleep  Sleep: Sleep: Good   No data recorded  Physical Exam  Physical Exam Vitals and nursing note reviewed.  Constitutional:      Appearance: Normal appearance. She is well-developed.  HENT:     Head: Normocephalic and atraumatic.     Nose: Nose normal.  Cardiovascular:     Rate and Rhythm: Normal rate.  Pulmonary:     Effort: Pulmonary effort is normal.  Musculoskeletal:        General: Normal range of motion.     Cervical back: Normal range of motion.  Skin:    General: Skin is warm and dry.  Neurological:     Mental Status: She is alert and oriented to person, place, and time.  Psychiatric:        Attention and Perception: Attention and perception normal.        Mood and Affect: Mood and affect normal.        Speech: Speech normal.        Behavior: Behavior normal. Behavior is cooperative.        Thought Content: Thought content normal.        Cognition and Memory: Cognition normal.    Review of Systems  Constitutional: Negative.   HENT: Negative.    Eyes: Negative.   Respiratory: Negative.    Cardiovascular: Negative.   Gastrointestinal: Negative.   Genitourinary: Negative.   Musculoskeletal: Negative.   Skin: Negative.   Neurological: Negative.   Psychiatric/Behavioral: Negative.     Blood pressure (!) 152/90, pulse (!) 105, temperature 98.3 F (36.8 C), temperature source Oral, resp. rate 19, SpO2 100 %. There is no height or weight on file to calculate BMI.  Treatment Plan Summary: Patient remains cleared by psychiatry. TOC team seeking disposition plan.  Daily contact with patient to assess and evaluate symptoms and progress in treatment  Lucky Rathke,  FNP 01/28/2022 9:46 AM

## 2022-01-28 NOTE — ED Notes (Signed)
Pt presents with pleasant mood, affect congruent. Teresa Murillo reports sleeping well and denies any acute concerns other than placement prospects. She states she spoke with SW jennifer and has not had any other new updates. She denies any SI HI or A/ V Hallucinations. Compliant with medications. Pt is safe. Provided muffins, coffee and water for breakfast. Will con't to monitor.

## 2022-01-28 NOTE — ED Notes (Signed)
Pt sleeping@this time. Breathing even and unlabored. Will continue to monitor for safety 

## 2022-01-28 NOTE — ED Notes (Signed)
Pt escorted to courtyard. Offered exercises but declined. Also provided coloring pages. Pt states hopeful to find new placement soon.

## 2022-01-28 NOTE — ED Notes (Signed)
Pt is awake sitting on the bed. Respirations are even and unlabored. No acute distress noted. Will continue to monitor for safety.

## 2022-01-28 NOTE — ED Notes (Signed)
Snack given.

## 2022-01-29 DIAGNOSIS — F209 Schizophrenia, unspecified: Secondary | ICD-10-CM | POA: Diagnosis not present

## 2022-01-29 LAB — GLUCOSE, CAPILLARY
Glucose-Capillary: 145 mg/dL — ABNORMAL HIGH (ref 70–99)
Glucose-Capillary: 203 mg/dL — ABNORMAL HIGH (ref 70–99)
Glucose-Capillary: 153 mg/dL — ABNORMAL HIGH (ref 70–99)
Glucose-Capillary: 166 mg/dL — ABNORMAL HIGH (ref 70–99)

## 2022-01-29 NOTE — Progress Notes (Signed)
Received report and assumed care of pt at 0730. Pt resting quietly. In no acute distress. Pt is safe.

## 2022-01-29 NOTE — ED Notes (Signed)
Pt is in the bed resting. Respirations are even and unlabored. No acute distress noted. Will continue to monitor for safety 

## 2022-01-29 NOTE — ED Notes (Signed)
Pt is in the bed watching TV. Respirations are even and unlabored. No acute distress noted. Will continue to monitor for safety.  

## 2022-01-29 NOTE — Progress Notes (Signed)
[facility] called the following agencies looking for placements:    [facility]                Telephone: [phone]      Fax: [phone]                   Email: [email or web address] -they only accept males 30 and up.     [facility]                                Telephone: [phone]                                   Fax:  [phone]                                                Email: [email or web address] -will only possibly consider [facility].  She must have an Sport and exercise psychologist.  Heather confirmed she doesn't and will not be an option.      [facility] [phone] -Left a VM requesting  a returned phone call back.     [facility]                                         Telephone: [phone]  Fax: [phone]               -asking for a copy of the FL2 if he decides to assess them.  He reports he's already in the process of interviewing two candidates.  He reports he f/u by end of the week or next week.  He would want to ensure it' s a good fit.      [facility] [place], [place], [phone] -Didn't answer.  No VM     [facility]  Telephone:                Galen Manila       [phone]. Fax: [phone], Cell: [phone]      -requested a copy of the FL2 for both clients be sent.  Also wanted to know about monthly financial costs they receive per month.  I spoke with Gwenlyn Perking today and emailed him the information requesting he contact her tomorrow for further questions.  There agency may consider placement depending on funding.  Agency is requesting documents be emailed to :[email or web address] Feinstein [facility]                          [facility]                                Telephone: [phone]  Fax: [phone] - left a VM    [facility] [address] [place],  Telephone: [phone] or [phone] -no openings    [facility]   [place], [phone] -Left a vm    [facility] Telephone: [phone] or [phone] -unable to leave a vm  [facility] Telephone: [phone], Fax: [phone] -unable to leave a vm    [facility] Telephone: [phone] -no openings    [facility] [address] [place], [place], [place] [address], Telephone: [phone], Fax: [phone] -number disconnected    Mariane Baumgarten [facility] [address] [place], [place], [place] [address], Telephone: [phone], [phone] Fax: [phone] -6 bed facility and is requesting a copy of FL2.  Adult Care living facility with 24 hour supervision.  He also reports it depends on pay and PCS funding. Email request and information to: [email or web address]    Rodell Perna [facility] [address] [place], Telephone: [phone]. Willaim Rayas Cell: [phone], Fax: [phone].  -no openings    [facility]  : [address] [place], [place], [address], (Ph) [phone], (F) [phone]    Griselda Miner: [phone] ----No current openings and asked to checked back mid December.

## 2022-01-29 NOTE — ED Provider Notes (Signed)
Behavioral Health Progress Note  Date and Time: 01/29/2022 8:24 AM Name: Teresa Murillo MRN:  [id number]  Subjective:  Patient states "I am fine, I slept better last night."  Per chart review: Teresa Murillo 61 y.o., female patient with a past psychiatric history of schizophrenia, aggressive behavior, and borderline intellectual disability who initially presented to [facility] on 11/23/2021 via [facility].  At that time patient had been dismissed from the group home due to multiple elopements.  Patient has remained at the [facility] while awaiting new placement.  Social work and DSS are involved.   Patient is reassessed, face-to-face, by nurse practitioner.  Teresa Murillo is seated in observation area upon my approach.  She is alert and oriented, pleasant and cooperative during assessment.  She presents with euthymic mood, congruent affect.   Teresa Murillo denies suicidal and homicidal ideation.  She contracts verbally for safety with this Probation officer.  She continues to deny auditory and visual hallucinations.  There is no evidence of delusional thought content and no indication that patient is responding to internal stimuli. Reports occasional abdominal cramping but reports having bowel movements.   She endorses average sleep and appetite.  Patient's behavior is pleasant and cooperative at [facility] health.  She is compliant with medication and engages appropriately with peers.  Patient offered support and encouragement. She verbalizes understanding of treatment plan, verbalizes readiness to discharge once appropriate placement secured.    Diagnosis:  Final diagnoses:  Schizophrenia, unspecified type ([place])  At risk for self care deficit  Noncompliance  Type 2 diabetes mellitus without complication, unspecified whether long term insulin use ([place])    Total Time spent with patient: 20 minutes  Past Psychiatric History: see H & P Past Medical History:  Past Medical History:  Diagnosis Date   Borderline  intellectual functioning 12/14/2021   On 12/14/2021: Appreciate assistance from psychology consult. On the Wechsler Adult Intelligence Scale-4, Ms. Springer achieved a full-scale IQ score of 73 and a percentile rank of 4 placing her in the borderline range of intellectual functioning.    Chronic obstructive pulmonary disease (COPD) (HCC)    Glaucoma    Hyperlipidemia    Hypertension    Iron deficiency    Schizoaffective disorder (HCC)    Type 2 diabetes mellitus (HCC)     Past Surgical History:  Procedure Laterality Date   TUBAL LIGATION     Family History:  Family History  Problem Relation Age of Onset   Breast cancer Maternal Grandmother    Family Psychiatric  History: see H & P Social History:  Social History   Substance and Sexual Activity  Alcohol Use Yes     Social History   Substance and Sexual Activity  Drug Use Not Currently    Social History   Socioeconomic History   Marital status: Divorced    Spouse name: Not on file   Number of children: Not on file   Years of education: Not on file   Highest education level: Not on file  Occupational History   Not on file  Tobacco Use   Smoking status: Every Day    Packs/day: 1.00    Types: Cigars, Cigarettes   Smokeless tobacco: Current  Vaping Use   Vaping Use: Never used  Substance and Sexual Activity   Alcohol use: Yes   Drug use: Not Currently   Sexual activity: Not Currently    Birth control/protection: Surgical  Other Topics Concern   Not on file  Social History Narrative  Not on file   Social Determinants of Health   Financial Resource Strain: Not on file  Food Insecurity: Not on file  Transportation Needs: Not on file  Physical Activity: Not on file  Stress: Not on file  Social Connections: Not on file   SDOH:  SDOH Screenings   Depression (PHQ2-9): Low Risk  (11/23/2021)  Tobacco Use: High Risk (01/26/2022)   Additional Social History:                         Sleep:  Good  Appetite:  Fair  Current Medications:  Current Facility-Administered Medications  Medication Dose Route Frequency Provider Last Rate Last Admin   acetaminophen (TYLENOL) tablet 650 mg  650 mg Oral Q6H PRN Revonda Humphrey, NP   650 mg at 01/28/22 2332   albuterol (VENTOLIN HFA) 108 (90 Base) MCG/ACT inhaler 2 puff  2 puff Inhalation Q6H PRN Revonda Humphrey, NP       alum & mag hydroxide-simeth (MAALOX/MYLANTA) 200-200-20 MG/5ML suspension 30 mL  30 mL Oral Q4H PRN Revonda Humphrey, NP       apixaban Arne Cleveland) tablet 5 mg  5 mg Oral BID Revonda Humphrey, NP   5 mg at 01/28/22 2111   ARIPiprazole (ABILIFY) tablet 10 mg  10 mg Oral Daily Revonda Humphrey, NP   10 mg at 01/28/22 0811   cloZAPine (CLOZARIL) tablet 50 mg  50 mg Oral Daily Revonda Humphrey, NP   50 mg at 01/28/22 0811   cloZAPine (CLOZARIL) tablet 75 mg  75 mg Oral QHS Revonda Humphrey, NP   75 mg at 01/28/22 2111   diltiazem (CARDIZEM CD) 24 hr capsule 240 mg  240 mg Oral Daily Revonda Humphrey, NP   240 mg at 01/28/22 0810   divalproex (DEPAKOTE ER) 24 hr tablet 500 mg  500 mg Oral BID Revonda Humphrey, NP   500 mg at 01/28/22 2110   fluticasone furoate-vilanterol (BREO ELLIPTA) 200-25 MCG/ACT 1 puff  1 puff Inhalation Daily Revonda Humphrey, NP   1 puff at 01/28/22 2197   haloperidol (HALDOL) tablet 10 mg  10 mg Oral TID PRN Revonda Humphrey, NP       hydrOXYzine (ATARAX) tablet 25 mg  25 mg Oral TID PRN Revonda Humphrey, NP   25 mg at 01/27/22 0156   insulin aspart (novoLOG) injection 0-9 Units  0-9 Units Subcutaneous TID WC Revonda Humphrey, NP   1 Units at 01/28/22 0817   insulin glargine-yfgn (SEMGLEE) injection 8 Units  8 Units Subcutaneous Q1200 Revonda Humphrey, NP   8 Units at 01/28/22 1254   latanoprost (XALATAN) 0.005 % ophthalmic solution 1 drop  1 drop Both Eyes QHS Thomes Lolling H, NP   1 drop at 01/28/22 2117   magnesium hydroxide (MILK OF MAGNESIA) suspension 30 mL  30 mL Oral  Daily PRN Revonda Humphrey, NP       menthol-cetylpyridinium (CEPACOL) lozenge 3 mg  1 lozenge Oral TID PRN Onuoha, Chinwendu V, NP   3 mg at 01/28/22 2315   metFORMIN (GLUCOPHAGE-XR) 24 hr tablet 1,000 mg  1,000 mg Oral BID WC Revonda Humphrey, NP   1,000 mg at 01/28/22 1629   nicotine (NICODERM CQ - dosed in mg/24 hr) patch 7 mg  7 mg Transdermal Daily Revonda Humphrey, NP   7 mg at 01/28/22 0812   traZODone (DESYREL) tablet 50 mg  50 mg  Oral QHS PRN Revonda Humphrey, NP   50 mg at 01/27/22 0156   valbenazine (INGREZZA) capsule 40 mg  40 mg Oral q AM Revonda Humphrey, NP   40 mg at 01/29/22 9323   Vitamin D (Ergocalciferol) (DRISDOL) 1.25 MG (50000 UNIT) capsule 50,000 Units  50,000 Units Oral Q Sharren Bridge, NP   50,000 Units at 01/28/22 5573   Current Outpatient Medications  Medication Sig Dispense Refill   Accu-Chek Softclix Lancets lancets Use as directed up to four times daily 100 each 0   apixaban (ELIQUIS) 5 MG TABS tablet Take 1 tablet (5 mg total) by mouth 2 (two) times daily. 60 tablet 0   ARIPiprazole (ABILIFY) 10 MG tablet Take 1 tablet (10 mg total) by mouth daily. 30 tablet 0   Blood Glucose Monitoring Suppl (ACCU-CHEK GUIDE) w/Device KIT Use as directed up to four times daily 1 kit 0   budesonide-formoterol (SYMBICORT) 160-4.5 MCG/ACT inhaler Inhale 2 puffs into the lungs in the morning and at bedtime.     Cholecalciferol (VITAMIN D3) 1.25 MG (50000 UT) CAPS Take 50,000 Units by mouth every Thursday.     cloZAPine (CLOZARIL) 25 MG tablet Take 3 tablets (75 mg total) by mouth at bedtime. 90 tablet 0   clozapine (CLOZARIL) 50 MG tablet Take 1 tablet (50 mg total) by mouth daily. 30 tablet 0   diltiazem (CARDIZEM CD) 240 MG 24 hr capsule Take 1 capsule (240 mg total) by mouth daily. (Patient not taking: Reported on 11/24/2021) 30 capsule 0   divalproex (DEPAKOTE ER) 500 MG 24 hr tablet Take 1 tablet (500 mg total) by mouth 2 (two) times daily. 60 tablet 0    glucose blood test strip Use as directed up to four times daily 50 each 0   haloperidol (HALDOL) 10 MG tablet Take 1 tablet (10 mg total) by mouth 3 (three) times daily as needed for agitation (and psychotic symptoms).     INGREZZA 40 MG capsule Take 1 capsule (40 mg total) by mouth in the morning. 30 capsule 0   insulin aspart (NOVOLOG) 100 UNIT/ML FlexPen Before each meal 3 times a day, 140-199 - 2 units, 200-250 - 4 units, 251-299 - 6 units,  300-349 - 8 units,  350 or above 10 units. Insulin PEN if approved, provide syringes and needles if needed.Please switch to any approved short acting Insulin if needed. 15 mL 0   insulin glargine (LANTUS) 100 UNIT/ML Solostar Pen Inject 12 Units into the skin daily. 15 mL 0   Insulin Pen Needle 32G X 4 MM MISC Use 4 times a day with insulin, 1 month supply. 100 each 0   latanoprost (XALATAN) 0.005 % ophthalmic solution Place 1 drop into both eyes at bedtime.     metFORMIN (GLUCOPHAGE) 500 MG tablet Take 500 mg by mouth 2 (two) times daily with a meal.     nicotine (NICODERM CQ - DOSED IN MG/24 HR) 7 mg/24hr patch Place 1 patch (7 mg total) onto the skin daily. 28 patch 0   PROAIR HFA 108 (90 Base) MCG/ACT inhaler Inhale 2 puffs into the lungs every 6 (six) hours as needed for wheezing or shortness of breath.      Labs  Lab Results:  Admission on 01/24/2022  Component Date Value Ref Range Status   Glucose-Capillary 01/24/2022 143 (H)  70 - 99 mg/dL Final   Glucose reference range applies only to samples taken after fasting for at least 8 hours.  Glucose-Capillary 01/24/2022 120 (H)  70 - 99 mg/dL Final   Glucose reference range applies only to samples taken after fasting for at least 8 hours.   Glucose-Capillary 01/25/2022 122 (H)  70 - 99 mg/dL Final   Glucose reference range applies only to samples taken after fasting for at least 8 hours.   Glucose-Capillary 01/25/2022 190 (H)  70 - 99 mg/dL Final   Glucose reference range applies only to samples  taken after fasting for at least 8 hours.   Glucose-Capillary 01/25/2022 163 (H)  70 - 99 mg/dL Final   Glucose reference range applies only to samples taken after fasting for at least 8 hours.   WBC 01/26/2022 6.5  4.0 - 10.5 K/uL Final   RBC 01/26/2022 4.53  3.87 - 5.11 MIL/uL Final   Hemoglobin 01/26/2022 11.3 (L)  12.0 - 15.0 g/dL Final   HCT 01/26/2022 36.4  36.0 - 46.0 % Final   MCV 01/26/2022 80.4  80.0 - 100.0 fL Final   MCH 01/26/2022 24.9 (L)  26.0 - 34.0 pg Final   MCHC 01/26/2022 31.0  30.0 - 36.0 g/dL Final   RDW 01/26/2022 17.3 (H)  11.5 - 15.5 % Final   Platelets 01/26/2022 327  150 - 400 K/uL Final   nRBC 01/26/2022 0.0  0.0 - 0.2 % Final   Neutrophils Relative % 01/26/2022 60  % Final   Neutro Abs 01/26/2022 3.9  1.7 - 7.7 K/uL Final   Lymphocytes Relative 01/26/2022 31  % Final   Lymphs Abs 01/26/2022 2.0  0.7 - 4.0 K/uL Final   Monocytes Relative 01/26/2022 7  % Final   Monocytes Absolute 01/26/2022 0.5  0.1 - 1.0 K/uL Final   Eosinophils Relative 01/26/2022 1  % Final   Eosinophils Absolute 01/26/2022 0.1  0.0 - 0.5 K/uL Final   Basophils Relative 01/26/2022 1  % Final   Basophils Absolute 01/26/2022 0.1  0.0 - 0.1 K/uL Final   Immature Granulocytes 01/26/2022 0  % Final   Abs Immature Granulocytes 01/26/2022 0.02  0.00 - 0.07 K/uL Final   Performed at [facility], [place] [address], [place], [place] [address]   Glucose-Capillary 01/26/2022 149 (H)  70 - 99 mg/dL Final   Glucose reference range applies only to samples taken after fasting for at least 8 hours.   Glucose-Capillary 01/26/2022 130 (H)  70 - 99 mg/dL Final   Glucose reference range applies only to samples taken after fasting for at least 8 hours.   Glucose-Capillary 01/26/2022 151 (H)  70 - 99 mg/dL Final   Glucose reference range applies only to samples taken after fasting for at least 8 hours.   Glucose-Capillary 01/26/2022 170 (H)  70 - 99 mg/dL Final   Glucose reference range applies only to  samples taken after fasting for at least 8 hours.   Glucose-Capillary 01/27/2022 129 (H)  70 - 99 mg/dL Final   Glucose reference range applies only to samples taken after fasting for at least 8 hours.   Glucose-Capillary 01/27/2022 101 (H)  70 - 99 mg/dL Final   Glucose reference range applies only to samples taken after fasting for at least 8 hours.   Glucose-Capillary 01/27/2022 220 (H)  70 - 99 mg/dL Final   Glucose reference range applies only to samples taken after fasting for at least 8 hours.   Glucose-Capillary 01/27/2022 154 (H)  70 - 99 mg/dL Final   Glucose reference range applies only to samples taken after fasting for  at least 8 hours.   Glucose-Capillary 01/27/2022 163 (H)  70 - 99 mg/dL Final   Glucose reference range applies only to samples taken after fasting for at least 8 hours.   Glucose-Capillary 01/28/2022 127 (H)  70 - 99 mg/dL Final   Glucose reference range applies only to samples taken after fasting for at least 8 hours.   Glucose-Capillary 01/28/2022 110 (H)  70 - 99 mg/dL Final   Glucose reference range applies only to samples taken after fasting for at least 8 hours.   Glucose-Capillary 01/28/2022 167 (H)  70 - 99 mg/dL Final   Glucose reference range applies only to samples taken after fasting for at least 8 hours.  Admission on 01/23/2022, Discharged on 01/24/2022  Component Date Value Ref Range Status   Sodium 01/23/2022 140  135 - 145 mmol/L Final   Potassium 01/23/2022 4.4  3.5 - 5.1 mmol/L Final   Chloride 01/23/2022 101  98 - 111 mmol/L Final   CO2 01/23/2022 25  22 - 32 mmol/L Final   Glucose, Bld 01/23/2022 198 (H)  70 - 99 mg/dL Final   Glucose reference range applies only to samples taken after fasting for at least 8 hours.   BUN 01/23/2022 13  6 - 20 mg/dL Final   Creatinine, Ser 01/23/2022 0.62  0.44 - 1.00 mg/dL Final   Calcium 01/23/2022 9.3  8.9 - 10.3 mg/dL Final   GFR, Estimated 01/23/2022 >60  >60 mL/min Final   Comment: (NOTE) Calculated  using the CKD-EPI Creatinine Equation (2021)    Anion gap 01/23/2022 14  5 - 15 Final   Performed at [facility], [place] [address] [place], [place] [address]   WBC 01/23/2022 5.8  4.0 - 10.5 K/uL Final   RBC 01/23/2022 4.63  3.87 - 5.11 MIL/uL Final   Hemoglobin 01/23/2022 11.7 (L)  12.0 - 15.0 g/dL Final   HCT 01/23/2022 37.4  36.0 - 46.0 % Final   MCV 01/23/2022 80.8  80.0 - 100.0 fL Final   MCH 01/23/2022 25.3 (L)  26.0 - 34.0 pg Final   MCHC 01/23/2022 31.3  30.0 - 36.0 g/dL Final   RDW 01/23/2022 17.9 (H)  11.5 - 15.5 % Final   Platelets 01/23/2022 333  150 - 400 K/uL Final   nRBC 01/23/2022 0.0  0.0 - 0.2 % Final   Performed at [place] [address], [place], [place] [address]   Troponin I (High Sensitivity) 01/23/2022 6  <18 ng/L Final   Comment: (NOTE) Elevated high sensitivity troponin I (hsTnI) values and significant  changes across serial measurements may suggest ACS but many other  chronic and acute conditions are known to elevate hsTnI results.  Refer to the "Links" section for chest pain algorithms and additional  guidance. Performed at [facility], [place] [address], [place], [place] [address]    Troponin I (High Sensitivity) 01/23/2022 5  <18 ng/L Final   Comment: (NOTE) Elevated high sensitivity troponin I (hsTnI) values and significant  changes across serial measurements may suggest ACS but many other  chronic and acute conditions are known to elevate hsTnI results.  Refer to the "Links" section for chest pain algorithms and additional  guidance. Performed at [facility], [place] [address] [place], [place] [address]    SARS Coronavirus 2 by RT PCR 01/23/2022 NEGATIVE  NEGATIVE Final   Comment: (NOTE) SARS-CoV-2 target nucleic acids are NOT DETECTED.  The SARS-CoV-2 RNA is generally detectable  in upper and lower respiratory specimens during the acute phase of infection. The lowest concentration of SARS-CoV-2 viral copies this assay  can detect is 250 copies / mL. A negative result does not preclude SARS-CoV-2 infection and should not be used as the sole basis for treatment or other patient management decisions.  A negative result may occur with improper specimen collection / handling, submission of specimen other than nasopharyngeal swab, presence of viral mutation(s) within the areas targeted by this assay, and inadequate number of viral copies (<250 copies / mL). A negative result must be combined with clinical observations, patient history, and epidemiological information.  Fact Sheet for Patients:   https://www.patel.info/  Fact Sheet for Healthcare Providers: https://hall.com/  This test is not yet approved or                           cleared by the [place] FDA and has been authorized for detection and/or diagnosis of SARS-CoV-2 by FDA under an Emergency Use Authorization (EUA).  This EUA will remain in effect (meaning this test can be used) for the duration of the COVID-19 declaration under Section 564(b)(1) of the Act, 21 U.S.C. section 360bbb-3(b)(1), unless the authorization is terminated or revoked sooner.  Performed at [facility], [place] [address] [place], [place] [address]    B Natriuretic Peptide 01/23/2022 41.3  0.0 - 100.0 pg/mL Final   Performed at [place] [address] [place], [place] [address]   Group A Strep by PCR 01/23/2022 NOT DETECTED  NOT DETECTED Final   Performed at [facility], [place] [address] [place], [place] [address]   Color, Urine 01/23/2022 YELLOW  YELLOW Final   APPearance 01/23/2022 CLEAR  CLEAR Final   Specific Gravity, Urine 01/23/2022 1.018  1.005 - 1.030 Final   pH 01/23/2022 7.0  5.0 - 8.0 Final   Glucose, UA 01/23/2022 NEGATIVE  NEGATIVE mg/dL Final   Hgb urine dipstick 01/23/2022 NEGATIVE  NEGATIVE Final   Bilirubin Urine 01/23/2022 NEGATIVE  NEGATIVE Final   Ketones, ur 01/23/2022  NEGATIVE  NEGATIVE mg/dL Final   Protein, ur 01/23/2022 NEGATIVE  NEGATIVE mg/dL Final   Nitrite 01/23/2022 NEGATIVE  NEGATIVE Final   Leukocytes,Ua 01/23/2022 TRACE (A)  NEGATIVE Final   RBC / HPF 01/23/2022 0-5  0 - 5 RBC/hpf Final   WBC, UA 01/23/2022 0-5  0 - 5 WBC/hpf Final   Bacteria, UA 01/23/2022 NONE SEEN  NONE SEEN Final   Squamous Epithelial / LPF 01/23/2022 0-5  0 - 5 Final   Mucus 01/23/2022 PRESENT   Final   Performed at [facility], [place] [address] [place], [place] [address]   SARS Coronavirus 2 by RT PCR 01/23/2022 NEGATIVE  NEGATIVE Final   Comment: (NOTE) SARS-CoV-2 target nucleic acids are NOT DETECTED.  The SARS-CoV-2 RNA is generally detectable in upper respiratory specimens during the acute phase of infection. The lowest concentration of SARS-CoV-2 viral copies this assay can detect is 138 copies/mL. A negative result does not preclude SARS-Cov-2 infection and should not be used as the sole basis for treatment or other patient management decisions. A negative result may occur with  improper specimen collection/handling, submission of specimen other than nasopharyngeal swab, presence of viral mutation(s) within the areas targeted by this assay, and inadequate number of viral copies(<138 copies/mL). A negative result must be combined with clinical observations, patient history, and epidemiological information. The expected result is  Negative.  Fact Sheet for Patients:  EntrepreneurPulse.com.au  Fact Sheet for Healthcare Providers:  IncredibleEmployment.be  This test is no                          t yet approved or cleared by the [place] FDA and  has been authorized for detection and/or diagnosis of SARS-CoV-2 by FDA under an Emergency Use Authorization (EUA). This EUA will remain  in effect (meaning this test can be used) for the duration of the COVID-19 declaration under Section 564(b)(1) of the Act,  21 U.S.C.section 360bbb-3(b)(1), unless the authorization is terminated  or revoked sooner.       Influenza A by PCR 01/23/2022 NEGATIVE  NEGATIVE Final   Influenza B by PCR 01/23/2022 NEGATIVE  NEGATIVE Final   Comment: (NOTE) The Xpert Xpress SARS-CoV-2/FLU/RSV plus assay is intended as an aid in the diagnosis of influenza from Nasopharyngeal swab specimens and should not be used as a sole basis for treatment. Nasal washings and aspirates are unacceptable for Xpert Xpress SARS-CoV-2/FLU/RSV testing.  Fact Sheet for Patients: EntrepreneurPulse.com.au  Fact Sheet for Healthcare Providers: IncredibleEmployment.be  This test is not yet approved or cleared by the [place] FDA and has been authorized for detection and/or diagnosis of SARS-CoV-2 by FDA under an Emergency Use Authorization (EUA). This EUA will remain in effect (meaning this test can be used) for the duration of the COVID-19 declaration under Section 564(b)(1) of the Act, 21 U.S.C. section 360bbb-3(b)(1), unless the authorization is terminated or revoked.  Performed at [facility], [place] [address], [place], [place] [address]    WBC 01/24/2022 6.1  4.0 - 10.5 K/uL Final   RBC 01/24/2022 4.60  3.87 - 5.11 MIL/uL Final   Hemoglobin 01/24/2022 11.7 (L)  12.0 - 15.0 g/dL Final   HCT 01/24/2022 37.0  36.0 - 46.0 % Final   MCV 01/24/2022 80.4  80.0 - 100.0 fL Final   MCH 01/24/2022 25.4 (L)  26.0 - 34.0 pg Final   MCHC 01/24/2022 31.6  30.0 - 36.0 g/dL Final   RDW 01/24/2022 17.6 (H)  11.5 - 15.5 % Final   Platelets 01/24/2022 334  150 - 400 K/uL Final   nRBC 01/24/2022 0.0  0.0 - 0.2 % Final   Neutrophils Relative % 01/24/2022 53  % Final   Neutro Abs 01/24/2022 3.3  1.7 - 7.7 K/uL Final   Lymphocytes Relative 01/24/2022 33  % Final   Lymphs Abs 01/24/2022 2.0  0.7 - 4.0 K/uL Final   Monocytes Relative 01/24/2022 11  % Final   Monocytes Absolute 01/24/2022 0.7  0.1 - 1.0  K/uL Final   Eosinophils Relative 01/24/2022 2  % Final   Eosinophils Absolute 01/24/2022 0.1  0.0 - 0.5 K/uL Final   Basophils Relative 01/24/2022 1  % Final   Basophils Absolute 01/24/2022 0.1  0.0 - 0.1 K/uL Final   Immature Granulocytes 01/24/2022 0  % Final   Abs Immature Granulocytes 01/24/2022 0.02  0.00 - 0.07 K/uL Final   Performed at [facility], [place] [address], [place], [place] [address]   Glucose-Capillary 01/24/2022 110 (H)  70 - 99 mg/dL Final   Glucose reference range applies only to samples taken after fasting for at least 8 hours.  No results displayed because visit has over 200 results.    Admission on 11/22/2021, Discharged on 11/22/2021  Component Date Value Ref Range Status   Glucose-Capillary 11/22/2021 169 (H)  70 - 99 mg/dL Final   Glucose reference range applies only to samples taken after fasting for at least 8 hours.  No results displayed because visit has over 200 results.    Admission on 10/04/2021, Discharged on 10/22/2021  Component Date Value Ref Range Status   SARS Coronavirus 2 by RT PCR 10/04/2021 NEGATIVE  NEGATIVE Final   Comment: (NOTE) SARS-CoV-2 target nucleic acids are NOT DETECTED.  The SARS-CoV-2 RNA is generally detectable in upper respiratory specimens during the acute phase of infection. The lowest concentration of SARS-CoV-2 viral copies this assay can detect is 138 copies/mL. A negative result does not preclude SARS-Cov-2 infection and should not be used as the sole basis for treatment or other patient management decisions. A negative result may occur with  improper specimen collection/handling, submission of specimen other than nasopharyngeal swab, presence of viral mutation(s) within the areas targeted by this assay, and inadequate number of viral copies(<138 copies/mL). A negative result must be combined with clinical observations, patient history, and epidemiological information. The expected result is Negative.  Fact  Sheet for Patients:  EntrepreneurPulse.com.au  Fact Sheet for Healthcare Providers:  IncredibleEmployment.be  This test is no                          t yet approved or cleared by the [place] FDA and  has been authorized for detection and/or diagnosis of SARS-CoV-2 by FDA under an Emergency Use Authorization (EUA). This EUA will remain  in effect (meaning this test can be used) for the duration of the COVID-19 declaration under Section 564(b)(1) of the Act, 21 U.S.C.section 360bbb-3(b)(1), unless the authorization is terminated  or revoked sooner.       Influenza A by PCR 10/04/2021 NEGATIVE  NEGATIVE Final   Influenza B by PCR 10/04/2021 NEGATIVE  NEGATIVE Final   Comment: (NOTE) The Xpert Xpress SARS-CoV-2/FLU/RSV plus assay is intended as an aid in the diagnosis of influenza from Nasopharyngeal swab specimens and should not be used as a sole basis for treatment. Nasal washings and aspirates are unacceptable for Xpert Xpress SARS-CoV-2/FLU/RSV testing.  Fact Sheet for Patients: EntrepreneurPulse.com.au  Fact Sheet for Healthcare Providers: IncredibleEmployment.be  This test is not yet approved or cleared by the [place] FDA and has been authorized for detection and/or diagnosis of SARS-CoV-2 by FDA under an Emergency Use Authorization (EUA). This EUA will remain in effect (meaning this test can be used) for the duration of the COVID-19 declaration under Section 564(b)(1) of the Act, 21 U.S.C. section 360bbb-3(b)(1), unless the authorization is terminated or revoked.  Performed at [facility], [place] [address] [place], [place] [address]    WBC 10/04/2021 8.4  4.0 - 10.5 K/uL Final   RBC 10/04/2021 4.42  3.87 - 5.11 MIL/uL Final   Hemoglobin 10/04/2021 11.6 (L)  12.0 - 15.0 g/dL Final   HCT 10/04/2021 35.7 (L)  36.0 - 46.0 % Final   MCV 10/04/2021 80.8  80.0 - 100.0 fL Final   MCH  10/04/2021 26.2  26.0 - 34.0 pg Final   MCHC 10/04/2021 32.5  30.0 - 36.0 g/dL Final   RDW 10/04/2021 16.0 (H)  11.5 - 15.5 % Final   Platelets 10/04/2021 372  150 - 400 K/uL Final   nRBC 10/04/2021 0.0  0.0 - 0.2 % Final   Neutrophils Relative % 10/04/2021 68  % Final   Neutro Abs 10/04/2021 5.7  1.7 - 7.7 K/uL Final  Lymphocytes Relative 10/04/2021 22  % Final   Lymphs Abs 10/04/2021 1.8  0.7 - 4.0 K/uL Final   Monocytes Relative 10/04/2021 8  % Final   Monocytes Absolute 10/04/2021 0.7  0.1 - 1.0 K/uL Final   Eosinophils Relative 10/04/2021 1  % Final   Eosinophils Absolute 10/04/2021 0.1  0.0 - 0.5 K/uL Final   Basophils Relative 10/04/2021 1  % Final   Basophils Absolute 10/04/2021 0.1  0.0 - 0.1 K/uL Final   Immature Granulocytes 10/04/2021 0  % Final   Abs Immature Granulocytes 10/04/2021 0.03  0.00 - 0.07 K/uL Final   Performed at [facility] [address] [place], [place] [address]   Sodium 10/04/2021 136  135 - 145 mmol/L Final   Potassium 10/04/2021 4.2  3.5 - 5.1 mmol/L Final   Chloride 10/04/2021 104  98 - 111 mmol/L Final   CO2 10/04/2021 25  22 - 32 mmol/L Final   Glucose, Bld 10/04/2021 100 (H)  70 - 99 mg/dL Final   Glucose reference range applies only to samples taken after fasting for at least 8 hours.   BUN 10/04/2021 9  6 - 20 mg/dL Final   Creatinine, Ser 10/04/2021 0.52  0.44 - 1.00 mg/dL Final   Calcium 10/04/2021 9.0  8.9 - 10.3 mg/dL Final   Total Protein 10/04/2021 7.0  6.5 - 8.1 g/dL Final   Albumin 10/04/2021 3.2 (L)  3.5 - 5.0 g/dL Final   AST 10/04/2021 13 (L)  15 - 41 U/L Final   ALT 10/04/2021 10  0 - 44 U/L Final   Alkaline Phosphatase 10/04/2021 61  38 - 126 U/L Final   Total Bilirubin 10/04/2021 0.3  0.3 - 1.2 mg/dL Final   GFR, Estimated 10/04/2021 >60  >60 mL/min Final   Comment: (NOTE) Calculated using the CKD-EPI Creatinine Equation (2021)    Anion gap 10/04/2021 7  5 - 15 Final   Performed at [facility]  [address], [place], [place] [address]   Hgb A1c MFr Bld 10/04/2021 7.0 (H)  4.8 - 5.6 % Final   Comment: (NOTE) Pre diabetes:          5.7%-6.4%  Diabetes:              >6.4%  Glycemic control for   <7.0% adults with diabetes    Mean Plasma Glucose 10/04/2021 154.2  mg/dL Final   Performed at [facility], [place] [address] [place], [place] [address]   Cholesterol 10/04/2021 178  0 - 200 mg/dL Final   Triglycerides 10/04/2021 155 (H)  <150 mg/dL Final   HDL 10/04/2021 52  >40 mg/dL Final   Total CHOL/HDL Ratio 10/04/2021 3.4  RATIO Final   VLDL 10/04/2021 31  0 - 40 mg/dL Final   LDL Cholesterol 10/04/2021 95  0 - 99 mg/dL Final   Comment:        Total Cholesterol/HDL:CHD Risk Coronary Heart Disease Risk Table                     Men   Women  1/2 Average Risk   3.4   3.3  Average Risk       5.0   4.4  2 X Average Risk   9.6   7.1  3 X Average Risk  23.4   11.0        Use the calculated Patient Ratio above and the CHD Risk Table to determine the  patient's CHD Risk.        ATP III CLASSIFICATION (LDL):  <100     mg/dL   Optimal  100-129  mg/dL   Near or Above                    Optimal  130-159  mg/dL   Borderline  160-189  mg/dL   High  >190     mg/dL   Very High Performed at [facility] [address], [place], [place] [address]    POC Amphetamine UR 10/04/2021 None Detected  NONE DETECTED (Cut Off Level 1000 ng/mL) Final   POC Secobarbital (BAR) 10/04/2021 None Detected  NONE DETECTED (Cut Off Level 300 ng/mL) Final   POC Buprenorphine (BUP) 10/04/2021 None Detected  NONE DETECTED (Cut Off Level 10 ng/mL) Final   POC Oxazepam (BZO) 10/04/2021 None Detected  NONE DETECTED (Cut Off Level 300 ng/mL) Final   POC Cocaine UR 10/04/2021 None Detected  NONE DETECTED (Cut Off Level 300 ng/mL) Final   POC Methamphetamine UR 10/04/2021 None Detected  NONE DETECTED (Cut Off Level 1000 ng/mL) Final   POC Morphine 10/04/2021 None Detected  NONE DETECTED (Cut Off Level 300 ng/mL)  Final   POC Methadone UR 10/04/2021 None Detected  NONE DETECTED (Cut Off Level 300 ng/mL) Final   POC Oxycodone UR 10/04/2021 Positive (A)  NONE DETECTED (Cut Off Level 100 ng/mL) Final   POC Marijuana UR 10/04/2021 None Detected  NONE DETECTED (Cut Off Level 50 ng/mL) Final   SARSCOV2ONAVIRUS 2 AG 10/04/2021 NEGATIVE  NEGATIVE Final   Comment: (NOTE) SARS-CoV-2 antigen NOT DETECTED.   Negative results are presumptive.  Negative results do not preclude SARS-CoV-2 infection and should not be used as the sole basis for treatment or other patient management decisions, including infection  control decisions, particularly in the presence of clinical signs and  symptoms consistent with COVID-19, or in those who have been in contact with the virus.  Negative results must be combined with clinical observations, patient history, and epidemiological information. The expected result is Negative.  Fact Sheet for Patients: HandmadeRecipes.com.cy  Fact Sheet for Healthcare Providers: FuneralLife.at  This test is not yet approved or cleared by the [place] FDA and  has been authorized for detection and/or diagnosis of SARS-CoV-2 by FDA under an Emergency Use Authorization (EUA).  This EUA will remain in effect (meaning this test can be used) for the duration of  the COV                          ID-19 declaration under Section 564(b)(1) of the Act, 21 U.S.C. section 360bbb-3(b)(1), unless the authorization is terminated or revoked sooner.     Valproic Acid Lvl 10/04/2021 51  50.0 - 100.0 ug/mL Final   Performed at [facility] [address] [place], [place] [address]   Valproic Acid Lvl 10/08/2021 57  50.0 - 100.0 ug/mL Final   Performed at [facility] [address] [place], [place] [address]   Glucose-Capillary 10/09/2021 123 (H)  70 - 99 mg/dL Final   Glucose reference range applies only to samples taken after fasting for  at least 8 hours.  Admission on 09/09/2021, Discharged on 09/10/2021  Component Date Value Ref Range Status   Sodium 09/09/2021 134 (L)  135 - 145 mmol/L Final   Potassium 09/09/2021 4.3  3.5 - 5.1 mmol/L Final   Chloride 09/09/2021 99  98 - 111 mmol/L Final   CO2 09/09/2021 25  22 - 32 mmol/L Final   Glucose, Bld 09/09/2021 107 (H)  70 - 99 mg/dL Final   Glucose reference range applies only to samples taken after fasting for at least 8 hours.   BUN 09/09/2021 12  6 - 20 mg/dL Final   Creatinine, Ser 09/09/2021 0.74  0.44 - 1.00 mg/dL Final   Calcium 09/09/2021 9.0  8.9 - 10.3 mg/dL Final   Total Protein 09/09/2021 7.4  6.5 - 8.1 g/dL Final   Albumin 09/09/2021 3.1 (L)  3.5 - 5.0 g/dL Final   AST 09/09/2021 13 (L)  15 - 41 U/L Final   ALT 09/09/2021 13  0 - 44 U/L Final   Alkaline Phosphatase 09/09/2021 66  38 - 126 U/L Final   Total Bilirubin 09/09/2021 0.2 (L)  0.3 - 1.2 mg/dL Final   GFR, Estimated 09/09/2021 >60  >60 mL/min Final   Comment: (NOTE) Calculated using the CKD-EPI Creatinine Equation (2021)    Anion gap 09/09/2021 10  5 - 15 Final   Performed at [facility], [place] [address] [place], [place] [address]   WBC 09/09/2021 8.5  4.0 - 10.5 K/uL Final   RBC 09/09/2021 4.53  3.87 - 5.11 MIL/uL Final   Hemoglobin 09/09/2021 11.8 (L)  12.0 - 15.0 g/dL Final   HCT 09/09/2021 38.0  36.0 - 46.0 % Final   MCV 09/09/2021 83.9  80.0 - 100.0 fL Final   MCH 09/09/2021 26.0  26.0 - 34.0 pg Final   MCHC 09/09/2021 31.1  30.0 - 36.0 g/dL Final   RDW 09/09/2021 17.8 (H)  11.5 - 15.5 % Final   Platelets 09/09/2021 442 (H)  150 - 400 K/uL Final   nRBC 09/09/2021 0.0  0.0 - 0.2 % Final   Neutrophils Relative % 09/09/2021 70  % Final   Neutro Abs 09/09/2021 5.9  1.7 - 7.7 K/uL Final   Lymphocytes Relative 09/09/2021 20  % Final   Lymphs Abs 09/09/2021 1.7  0.7 - 4.0 K/uL Final   Monocytes Relative 09/09/2021 8  % Final   Monocytes Absolute 09/09/2021 0.7  0.1 - 1.0 K/uL Final    Eosinophils Relative 09/09/2021 1  % Final   Eosinophils Absolute 09/09/2021 0.1  0.0 - 0.5 K/uL Final   Basophils Relative 09/09/2021 1  % Final   Basophils Absolute 09/09/2021 0.1  0.0 - 0.1 K/uL Final   Immature Granulocytes 09/09/2021 0  % Final   Abs Immature Granulocytes 09/09/2021 0.03  0.00 - 0.07 K/uL Final   Performed at [facility], [place] [address], [place], [place] [address]   Ammonia 09/09/2021 37 (H)  9 - 35 umol/L Final   Comment: HEMOLYSIS AT THIS LEVEL MAY AFFECT RESULT Performed at [facility], [place] [address] [place], [place] [address]    Opiates 09/09/2021 NONE DETECTED  NONE DETECTED Final   Cocaine 09/09/2021 NONE DETECTED  NONE DETECTED Final   Benzodiazepines 09/09/2021 NONE DETECTED  NONE DETECTED Final   Amphetamines 09/09/2021 NONE DETECTED  NONE DETECTED Final   Tetrahydrocannabinol 09/09/2021 NONE DETECTED  NONE DETECTED Final   Barbiturates 09/09/2021 NONE DETECTED  NONE DETECTED Final   Comment: (NOTE) DRUG SCREEN FOR MEDICAL PURPOSES ONLY.  IF CONFIRMATION IS NEEDED FOR ANY PURPOSE, NOTIFY LAB WITHIN 5 DAYS.  LOWEST DETECTABLE LIMITS FOR URINE DRUG SCREEN Drug Class  Cutoff (ng/mL) Amphetamine and metabolites    1000 Barbiturate and metabolites    200 Benzodiazepine                 010 Tricyclics and metabolites     300 Opiates and metabolites        300 Cocaine and metabolites        300 THC                            50 Performed at [facility], [place] [address], [place], [place] [address]    Alcohol, Ethyl (B) 09/09/2021 <10  <10 mg/dL Final   Comment: (NOTE) Lowest detectable limit for serum alcohol is 10 mg/dL.  For medical purposes only. Performed at [facility], [place] [address], [place], [place] [address]    Lipase 09/09/2021 33  11 - 51 U/L Final   Performed at [facility] [address] [place], [place] [address]   Color, Urine 09/09/2021 COLORLESS (A)  YELLOW Final   APPearance  09/09/2021 CLEAR  CLEAR Final   Specific Gravity, Urine 09/09/2021 1.002 (L)  1.005 - 1.030 Final   pH 09/09/2021 6.0  5.0 - 8.0 Final   Glucose, UA 09/09/2021 NEGATIVE  NEGATIVE mg/dL Final   Hgb urine dipstick 09/09/2021 NEGATIVE  NEGATIVE Final   Bilirubin Urine 09/09/2021 NEGATIVE  NEGATIVE Final   Ketones, ur 09/09/2021 NEGATIVE  NEGATIVE mg/dL Final   Protein, ur 09/09/2021 NEGATIVE  NEGATIVE mg/dL Final   Nitrite 09/09/2021 NEGATIVE  NEGATIVE Final   Leukocytes,Ua 09/09/2021 NEGATIVE  NEGATIVE Final   Performed at [facility] [address], [place], [place] [address]   Specimen Description 09/09/2021 URINE, CLEAN CATCH   Final   Special Requests 09/09/2021 NONE   Final   Culture 09/09/2021  (A)   Final                   Value:<10,000 COLONIES/mL INSIGNIFICANT GROWTH Performed at [facility], [place] [address] [place], [place] [address]    Report Status 09/09/2021 09/10/2021 FINAL   Final   D-Dimer, Quant 09/09/2021 0.92 (H)  0.00 - 0.50 ug/mL-FEU Final   Comment: (NOTE) At the manufacturer cut-off value of 0.5 g/mL FEU, this assay has a negative predictive value of 95-100%.This assay is intended for use in conjunction with a clinical pretest probability (PTP) assessment model to exclude pulmonary embolism (PE) and deep venous thrombosis (DVT) in outpatients suspected of PE or DVT. Results should be correlated with clinical presentation. Performed at [facility], [place] [address] [place], [place] [address]    Troponin I (High Sensitivity) 09/09/2021 5  <18 ng/L Final   Comment: (NOTE) Elevated high sensitivity troponin I (hsTnI) values and significant  changes across serial measurements may suggest ACS but many other  chronic and acute conditions are known to elevate hsTnI results.  Refer to the "Links" section for chest pain algorithms and additional  guidance. Performed at [facility], [place] [address], [place], [place] [address]    BP  09/10/2021 135/83  mmHg Final   S' Lateral 09/10/2021 2.30  cm Final   Area-P 1/2 09/10/2021 5.58  cm2 Final    Blood Alcohol level:  Lab Results  Component Value Date   ETH <10 11/23/2021   ETH <10 66/07/3014    Metabolic Disorder Labs: Lab Results  Component Value Date  HGBA1C 6.7 (H) 12/28/2021   MPG 145.59 12/28/2021   MPG 154.2 10/04/2021   Lab Results  Component Value Date   PROLACTIN 3.8 (L) 11/23/2021   Lab Results  Component Value Date   CHOL 171 11/23/2021   TRIG 125 11/23/2021   HDL 65 11/23/2021   CHOLHDL 2.6 11/23/2021   VLDL 25 11/23/2021   LDLCALC 81 11/23/2021   LDLCALC 95 10/04/2021    Therapeutic Lab Levels: No results found for: "LITHIUM" Lab Results  Component Value Date   VALPROATE 33 (L) 01/18/2022   VALPROATE 33 (L) 12/28/2021   No results found for: "CBMZ"  Physical Findings   PHQ2-9    Flowsheet Row ED from 11/23/2021 in [facility]  PHQ-2 Total Score 2  PHQ-9 Total Score 3      Flowsheet Row ED from 01/23/2022 in [facility] ED from [facility]/23[facility]20[facility] in [facility] from 11/22/2021 in [facility] DEPT  C-SSRS RISK CATEGORY No Risk No Risk No Risk        Musculoskeletal  Strength & Muscle Tone: within normal limits Gait & Station: normal Patient leans: N/A  Psychiatric Specialty Exam  Presentation  General Appearance:  Appropriate for Environment; Casual  Eye Contact: Good  Speech: Clear and Coherent; Normal Rate  Speech Volume: Normal  Handedness: Right   Mood and Affect  Mood: Euthymic  Affect: Appropriate; Congruent   Thought Process  Thought Processes: Coherent; Goal Directed; Linear  Descriptions of Associations:Intact  Orientation:Full (Time, Place and Person)  Thought Content:Logical; WDL  Diagnosis of Schizophrenia or Schizoaffective disorder in past: Yes  Duration of  Psychotic Symptoms: No data recorded  Hallucinations:Hallucinations: None  Ideas of Reference:None  Suicidal Thoughts:Suicidal Thoughts: No  Homicidal Thoughts:Homicidal Thoughts: No   Sensorium  Memory: Immediate Good; Recent Fair  Judgment: Intact  Insight: Present   Executive Functions  Concentration: Good  Attention Span: Good  Recall: Good  Fund of Knowledge: Fair  Language: Fair   Psychomotor Activity  Psychomotor Activity: Psychomotor Activity: Normal   Assets  Assets: Communication Skills; Desire for Improvement; Resilience   Sleep  Sleep: Sleep: Good   No data recorded  Physical Exam  Physical Exam Vitals and nursing note reviewed.  Constitutional:      General: She is not in acute distress.    Appearance: Normal appearance. She is well-developed.  HENT:     Head: Normocephalic and atraumatic.     Nose: Nose normal.  Eyes:     Conjunctiva/sclera: Conjunctivae normal.  Cardiovascular:     Heart sounds: No murmur heard. Pulmonary:     Effort: Pulmonary effort is normal. No respiratory distress.     Breath sounds: Normal breath sounds.  Abdominal:     Palpations: Abdomen is soft.     Tenderness: There is no abdominal tenderness.  Musculoskeletal:        General: No swelling. Normal range of motion.  Skin:    General: Skin is warm and dry.     Capillary Refill: Capillary refill takes less than 2 seconds.  Neurological:     Mental Status: She is alert and oriented to person, place, and time.  Psychiatric:        Attention and Perception: Attention and perception normal.        Mood and Affect: Mood and affect normal.        Speech: Speech normal.        Behavior: Behavior  normal. Behavior is cooperative.        Thought Content: Thought content normal.        Cognition and Memory: Cognition normal.   Review of Systems  Constitutional: Negative.   HENT: Negative.    Eyes: Negative.   Respiratory: Negative.     Cardiovascular: Negative.   Gastrointestinal: Negative.   Genitourinary: Negative.   Musculoskeletal: Negative.   Skin: Negative.   Neurological: Negative.   Psychiatric/Behavioral: Negative.     Blood pressure (!) 112/90, pulse (!) 121, temperature 97.8 F (36.6 C), temperature source Oral, resp. rate 16, SpO2 94 %. There is no height or weight on file to calculate BMI.  Treatment Plan Summary: Patient remains cleared by psychiatry. TOC team seeking disposition plan.  Daily contact with patient to assess and evaluate symptoms and progress in treatment  France Ravens, MD 01/29/2022 8:24 AM

## 2022-01-30 DIAGNOSIS — F209 Schizophrenia, unspecified: Secondary | ICD-10-CM | POA: Diagnosis not present

## 2022-01-30 LAB — GLUCOSE, CAPILLARY
Glucose-Capillary: 142 mg/dL — ABNORMAL HIGH (ref 70–99)
Glucose-Capillary: 138 mg/dL — ABNORMAL HIGH (ref 70–99)
Glucose-Capillary: 185 mg/dL — ABNORMAL HIGH (ref 70–99)
Glucose-Capillary: 220 mg/dL — ABNORMAL HIGH (ref 70–99)

## 2022-01-30 NOTE — ED Notes (Signed)
Snack given.

## 2022-01-30 NOTE — ED Notes (Signed)
Pt A&O x 4, calm & cooperative, watching TV at present.  No distress noted.  Monitoring for safety.

## 2022-01-30 NOTE — ED Notes (Signed)
Pt accepted scheduled PO meds and insulin per orders without difficulty. Breakfast provided earlier in the shift. Pt currently resting on pull out chair/bed. Converses with staff and other patients. Safety maintained and will continue to monitor.

## 2022-01-30 NOTE — ED Notes (Signed)
Pt is sleeping. No distress noted. Will continue to monitor safety. 

## 2022-01-30 NOTE — ED Provider Notes (Signed)
Behavioral Health Progress Note  Date and Time: 01/30/2022 11:55 AM Name: Teresa Murillo MRN:  [id number]  Subjective:    Teresa Murillo is a 61 year old female with a past psychiatric history significant for schizophrenia, aggressive behavior, and possible intellectual disability who presented to [facility] on 11/23/2021 as a walk-in. At the time, patient had been dismissed from the group home due to multiple elopements. Patient has remained at [facility] while awaiting new placement. Social work and DSS are involved.  Patient reports that she is doing well, despite some pain she has been experiencing in her right ankle. Patient reports that her pain has been going on for a couple of days and states that she has received over the counter pain medication for it. Patient endorses some depression stating that it has been going on for 6 months. Patient denies wanting to answer any more question related to her mood.  Patient denies suicidal or homicidal ideations. Patient denies auditory or visual hallucinations and does not appear to be responding to internal/external stimuli. Patient endorses paranoia but did not go into detail about her paranoia. Patient also endorses delusional thoughts but does not describe what her delusions are. Patient states that her sleep has not been good but her appetite is tremendous.  Patient is casually dressed, alert and oriented x4. Patient is calm, cooperative, and engaged in conversation during the encounter. Patient maintains fair eye contact. Patient's speech is clear, coherent, and with normal rate. Patient thought process is fluent. Patient's thought content is logical but patient does endorse paranoia and delusional thoughts. Patient denies suicidal or homicidal ideations. Patient does not appear to be responding to internal/external stimuli.  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 On the Wechsler Adult Intelligence Scale-4, Ms. Espaillat achieved a full-scale IQ score of 73 and a percentile rank of 4 placing her in the borderline range of intellectual functioning.    Chronic obstructive pulmonary disease (COPD) (HCC)    Glaucoma    Hyperlipidemia    Hypertension    Iron deficiency    Schizoaffective disorder (HCC)    Type 2 diabetes mellitus (HCC)     Past Surgical History:  Procedure Laterality Date   TUBAL LIGATION     Family History:  Family History  Problem Relation Age of Onset   Breast cancer Maternal Grandmother    Family Psychiatric  History: See H & P Social History:  Social History   Substance and Sexual Activity  Alcohol Use Yes     Social History   Substance and Sexual Activity  Drug Use Not Currently    Social History   Socioeconomic History   Marital status: Divorced    Spouse name: Not on file   Number of children: Not on file   Years of education: Not on file   Highest education level: Not on file  Occupational History   Not on file  Tobacco Use   Smoking status: Every Day    Packs/day: 1.00    Types: Cigars, Cigarettes   Smokeless tobacco: Current  Vaping Use   Vaping Use: Never used  Substance and  Sexual Activity   Alcohol use: Yes   Drug use: Not Currently   Sexual activity: Not Currently    Birth control/protection: Surgical  Other Topics Concern   Not on file  Social History Narrative   Not on file   Social Determinants of Health   Financial Resource Strain: Not on file  Food Insecurity: Not on file  Transportation Needs: Not on file  Physical Activity: Not on  file  Stress: Not on file  Social Connections: Not on file   SDOH:  SDOH Screenings   Depression (PHQ2-9): Low Risk  (11/23/2021)  Tobacco Use: High Risk (01/26/2022)   Additional Social History:                         Sleep: Fair  Appetite:  Good  Current Medications:  Current Facility-Administered Medications  Medication Dose Route Frequency Provider Last Rate Last Admin   acetaminophen (TYLENOL) tablet 650 mg  650 mg Oral Q6H PRN Revonda Humphrey, NP   650 mg at 01/28/22 2332   albuterol (VENTOLIN HFA) 108 (90 Base) MCG/ACT inhaler 2 puff  2 puff Inhalation Q6H PRN Revonda Humphrey, NP       alum & mag hydroxide-simeth (MAALOX/MYLANTA) 200-200-20 MG/5ML suspension 30 mL  30 mL Oral Q4H PRN Revonda Humphrey, NP       apixaban Arne Cleveland) tablet 5 mg  5 mg Oral BID Revonda Humphrey, NP   5 mg at 01/30/22 0916   ARIPiprazole (ABILIFY) tablet 10 mg  10 mg Oral Daily Revonda Humphrey, NP   10 mg at 01/30/22 0916   cloZAPine (CLOZARIL) tablet 50 mg  50 mg Oral Daily Revonda Humphrey, NP   50 mg at 01/30/22 0916   cloZAPine (CLOZARIL) tablet 75 mg  75 mg Oral QHS Revonda Humphrey, NP   75 mg at 01/29/22 2150   diltiazem (CARDIZEM CD) 24 hr capsule 240 mg  240 mg Oral Daily Revonda Humphrey, NP   240 mg at 01/30/22 0916   divalproex (DEPAKOTE ER) 24 hr tablet 500 mg  500 mg Oral BID Revonda Humphrey, NP   500 mg at 01/30/22 0916   fluticasone furoate-vilanterol (BREO ELLIPTA) 200-25 MCG/ACT 1 puff  1 puff Inhalation Daily Revonda Humphrey, NP   1 puff at 01/30/22 0915   haloperidol (HALDOL) tablet 10 mg  10 mg Oral TID PRN Revonda Humphrey, NP       hydrOXYzine (ATARAX) tablet 25 mg  25 mg Oral TID PRN Revonda Humphrey, NP   25 mg at 01/27/22 0156   insulin aspart (novoLOG) injection 0-9 Units  0-9 Units Subcutaneous TID WC Revonda Humphrey, NP   1 Units at 01/30/22 1142   insulin glargine-yfgn (SEMGLEE) injection 8 Units  8 Units Subcutaneous Q1200  Revonda Humphrey, NP   8 Units at 01/30/22 1142   latanoprost (XALATAN) 0.005 % ophthalmic solution 1 drop  1 drop Both Eyes QHS Thomes Lolling H, NP   1 drop at 01/29/22 2153   magnesium hydroxide (MILK OF MAGNESIA) suspension 30 mL  30 mL Oral Daily PRN Revonda Humphrey, NP       menthol-cetylpyridinium (CEPACOL) lozenge 3 mg  1 lozenge Oral TID PRN Onuoha, Chinwendu V, NP   3 mg at 01/28/22 2315   metFORMIN (GLUCOPHAGE-XR) 24 hr tablet 1,000 mg  1,000 mg Oral BID WC Revonda Humphrey, NP   1,000 mg at 01/30/22  0916   nicotine (NICODERM CQ - dosed in mg/24 hr) patch 7 mg  7 mg Transdermal Daily Revonda Humphrey, NP   7 mg at 01/30/22 0916   traZODone (DESYREL) tablet 50 mg  50 mg Oral QHS PRN Revonda Humphrey, NP   50 mg at 01/27/22 0156   valbenazine (INGREZZA) capsule 40 mg  40 mg Oral q AM Revonda Humphrey, NP   40 mg at 01/30/22 0630   Vitamin D (Ergocalciferol) (DRISDOL) 1.25 MG (50000 UNIT) capsule 50,000 Units  50,000 Units Oral Q Sharren Bridge, NP   50,000 Units at 01/28/22 7416   Current Outpatient Medications  Medication Sig Dispense Refill   Accu-Chek Softclix Lancets lancets Use as directed up to four times daily 100 each 0   apixaban (ELIQUIS) 5 MG TABS tablet Take 1 tablet (5 mg total) by mouth 2 (two) times daily. 60 tablet 0   ARIPiprazole (ABILIFY) 10 MG tablet Take 1 tablet (10 mg total) by mouth daily. 30 tablet 0   Blood Glucose Monitoring Suppl (ACCU-CHEK GUIDE) w/Device KIT Use as directed up to four times daily 1 kit 0   budesonide-formoterol (SYMBICORT) 160-4.5 MCG/ACT inhaler Inhale 2 puffs into the lungs in the morning and at bedtime.     Cholecalciferol (VITAMIN D3) 1.25 MG (50000 UT) CAPS Take 50,000 Units by mouth every Thursday.     cloZAPine (CLOZARIL) 25 MG tablet Take 3 tablets (75 mg total) by mouth at bedtime. 90 tablet 0   clozapine (CLOZARIL) 50 MG tablet Take 1 tablet (50 mg total) by mouth daily. 30 tablet 0   diltiazem (CARDIZEM CD)  240 MG 24 hr capsule Take 1 capsule (240 mg total) by mouth daily. (Patient not taking: Reported on 11/24/2021) 30 capsule 0   divalproex (DEPAKOTE ER) 500 MG 24 hr tablet Take 1 tablet (500 mg total) by mouth 2 (two) times daily. 60 tablet 0   glucose blood test strip Use as directed up to four times daily 50 each 0   haloperidol (HALDOL) 10 MG tablet Take 1 tablet (10 mg total) by mouth 3 (three) times daily as needed for agitation (and psychotic symptoms).     INGREZZA 40 MG capsule Take 1 capsule (40 mg total) by mouth in the morning. 30 capsule 0   insulin aspart (NOVOLOG) 100 UNIT/ML FlexPen Before each meal 3 times a day, 140-199 - 2 units, 200-250 - 4 units, 251-299 - 6 units,  300-349 - 8 units,  350 or above 10 units. Insulin PEN if approved, provide syringes and needles if needed.Please switch to any approved short acting Insulin if needed. 15 mL 0   insulin glargine (LANTUS) 100 UNIT/ML Solostar Pen Inject 12 Units into the skin daily. 15 mL 0   Insulin Pen Needle 32G X 4 MM MISC Use 4 times a day with insulin, 1 month supply. 100 each 0   latanoprost (XALATAN) 0.005 % ophthalmic solution Place 1 drop into both eyes at bedtime.     metFORMIN (GLUCOPHAGE) 500 MG tablet Take 500 mg by mouth 2 (two) times daily with a meal.     nicotine (NICODERM CQ - DOSED IN MG/24 HR) 7 mg/24hr patch Place 1 patch (7 mg total) onto the skin daily. 28 patch 0   PROAIR HFA 108 (90 Base) MCG/ACT inhaler Inhale 2 puffs into the lungs every 6 (six) hours as needed for wheezing or shortness of breath.      Labs  Lab Results:  Admission on 01/24/2022  Component Date Value Ref Range Status   Glucose-Capillary 01/24/2022 143 (H)  70 - 99 mg/dL Final   Glucose reference range applies only to samples taken after fasting for at least 8 hours.   Glucose-Capillary 01/24/2022 120 (H)  70 - 99 mg/dL Final   Glucose reference range applies only to samples taken after fasting for at least 8 hours.   Glucose-Capillary  01/25/2022 122 (H)  70 - 99 mg/dL Final   Glucose reference range applies only to samples taken after fasting for at least 8 hours.   Glucose-Capillary 01/25/2022 190 (H)  70 - 99 mg/dL Final   Glucose reference range applies only to samples taken after fasting for at least 8 hours.   Glucose-Capillary 01/25/2022 163 (H)  70 - 99 mg/dL Final   Glucose reference range applies only to samples taken after fasting for at least 8 hours.   WBC 01/26/2022 6.5  4.0 - 10.5 K/uL Final   RBC 01/26/2022 4.53  3.87 - 5.11 MIL/uL Final   Hemoglobin 01/26/2022 11.3 (L)  12.0 - 15.0 g/dL Final   HCT 01/26/2022 36.4  36.0 - 46.0 % Final   MCV 01/26/2022 80.4  80.0 - 100.0 fL Final   MCH 01/26/2022 24.9 (L)  26.0 - 34.0 pg Final   MCHC 01/26/2022 31.0  30.0 - 36.0 g/dL Final   RDW 01/26/2022 17.3 (H)  11.5 - 15.5 % Final   Platelets 01/26/2022 327  150 - 400 K/uL Final   nRBC 01/26/2022 0.0  0.0 - 0.2 % Final   Neutrophils Relative % 01/26/2022 60  % Final   Neutro Abs 01/26/2022 3.9  1.7 - 7.7 K/uL Final   Lymphocytes Relative 01/26/2022 31  % Final   Lymphs Abs 01/26/2022 2.0  0.7 - 4.0 K/uL Final   Monocytes Relative 01/26/2022 7  % Final   Monocytes Absolute 01/26/2022 0.5  0.1 - 1.0 K/uL Final   Eosinophils Relative 01/26/2022 1  % Final   Eosinophils Absolute 01/26/2022 0.1  0.0 - 0.5 K/uL Final   Basophils Relative 01/26/2022 1  % Final   Basophils Absolute 01/26/2022 0.1  0.0 - 0.1 K/uL Final   Immature Granulocytes 01/26/2022 0  % Final   Abs Immature Granulocytes 01/26/2022 0.02  0.00 - 0.07 K/uL Final   Performed at [facility], [place] [address] [place], [place] [address]   Glucose-Capillary 01/26/2022 149 (H)  70 - 99 mg/dL Final   Glucose reference range applies only to samples taken after fasting for at least 8 hours.   Glucose-Capillary 01/26/2022 130 (H)  70 - 99 mg/dL Final   Glucose reference range applies only to samples taken after fasting for at least 8 hours.    Glucose-Capillary 01/26/2022 151 (H)  70 - 99 mg/dL Final   Glucose reference range applies only to samples taken after fasting for at least 8 hours.   Glucose-Capillary 01/26/2022 170 (H)  70 - 99 mg/dL Final   Glucose reference range applies only to samples taken after fasting for at least 8 hours.   Glucose-Capillary 01/27/2022 129 (H)  70 - 99 mg/dL Final   Glucose reference range applies only to samples taken after fasting for at least 8 hours.   Glucose-Capillary 01/27/2022 101 (H)  70 - 99 mg/dL Final   Glucose reference range applies only to samples taken after fasting for at least 8 hours.   Glucose-Capillary 01/27/2022 220 (H)  70 - 99 mg/dL Final  Glucose reference range applies only to samples taken after fasting for at least 8 hours.   Glucose-Capillary 01/27/2022 154 (H)  70 - 99 mg/dL Final   Glucose reference range applies only to samples taken after fasting for at least 8 hours.   Glucose-Capillary 01/27/2022 163 (H)  70 - 99 mg/dL Final   Glucose reference range applies only to samples taken after fasting for at least 8 hours.   Glucose-Capillary 01/28/2022 127 (H)  70 - 99 mg/dL Final   Glucose reference range applies only to samples taken after fasting for at least 8 hours.   Glucose-Capillary 01/28/2022 110 (H)  70 - 99 mg/dL Final   Glucose reference range applies only to samples taken after fasting for at least 8 hours.   Glucose-Capillary 01/28/2022 167 (H)  70 - 99 mg/dL Final   Glucose reference range applies only to samples taken after fasting for at least 8 hours.   Glucose-Capillary 01/29/2022 145 (H)  70 - 99 mg/dL Final   Glucose reference range applies only to samples taken after fasting for at least 8 hours.   Glucose-Capillary 01/29/2022 203 (H)  70 - 99 mg/dL Final   Glucose reference range applies only to samples taken after fasting for at least 8 hours.   Glucose-Capillary 01/29/2022 153 (H)  70 - 99 mg/dL Final   Glucose reference range applies only to  samples taken after fasting for at least 8 hours.   Glucose-Capillary 01/29/2022 166 (H)  70 - 99 mg/dL Final   Glucose reference range applies only to samples taken after fasting for at least 8 hours.   Glucose-Capillary 01/30/2022 142 (H)  70 - 99 mg/dL Final   Glucose reference range applies only to samples taken after fasting for at least 8 hours.  Admission on 01/23/2022, Discharged on 01/24/2022  Component Date Value Ref Range Status   Sodium 01/23/2022 140  135 - 145 mmol/L Final   Potassium 01/23/2022 4.4  3.5 - 5.1 mmol/L Final   Chloride 01/23/2022 101  98 - 111 mmol/L Final   CO2 01/23/2022 25  22 - 32 mmol/L Final   Glucose, Bld 01/23/2022 198 (H)  70 - 99 mg/dL Final   Glucose reference range applies only to samples taken after fasting for at least 8 hours.   BUN 01/23/2022 13  6 - 20 mg/dL Final   Creatinine, Ser 01/23/2022 0.62  0.44 - 1.00 mg/dL Final   Calcium 01/23/2022 9.3  8.9 - 10.3 mg/dL Final   GFR, Estimated 01/23/2022 >60  >60 mL/min Final   Comment: (NOTE) Calculated using the CKD-EPI Creatinine Equation (2021)    Anion gap 01/23/2022 14  5 - 15 Final   Performed at [facility], [place] [address] [place], [place] [address]   WBC 01/23/2022 5.8  4.0 - 10.5 K/uL Final   RBC 01/23/2022 4.63  3.87 - 5.11 MIL/uL Final   Hemoglobin 01/23/2022 11.7 (L)  12.0 - 15.0 g/dL Final   HCT 01/23/2022 37.4  36.0 - 46.0 % Final   MCV 01/23/2022 80.8  80.0 - 100.0 fL Final   MCH 01/23/2022 25.3 (L)  26.0 - 34.0 pg Final   MCHC 01/23/2022 31.3  30.0 - 36.0 g/dL Final   RDW 01/23/2022 17.9 (H)  11.5 - 15.5 % Final   Platelets 01/23/2022 333  150 - 400 K/uL Final   nRBC 01/23/2022 0.0  0.0 - 0.2 % Final   Performed at [place]  [address] [place], [place] [address]   Troponin I (High Sensitivity) 01/23/2022 6  <18 ng/L Final   Comment: (NOTE) Elevated high sensitivity troponin I (hsTnI) values and significant  changes across serial measurements may suggest  ACS but many other  chronic and acute conditions are known to elevate hsTnI results.  Refer to the "Links" section for chest pain algorithms and additional  guidance. Performed at [facility], [place] [address], [place], [place] [address]    Troponin I (High Sensitivity) 01/23/2022 5  <18 ng/L Final   Comment: (NOTE) Elevated high sensitivity troponin I (hsTnI) values and significant  changes across serial measurements may suggest ACS but many other  chronic and acute conditions are known to elevate hsTnI results.  Refer to the "Links" section for chest pain algorithms and additional  guidance. Performed at [facility], [place] [address], [place], [place] [address]    SARS Coronavirus 2 by RT PCR 01/23/2022 NEGATIVE  NEGATIVE Final   Comment: (NOTE) SARS-CoV-2 target nucleic acids are NOT DETECTED.  The SARS-CoV-2 RNA is generally detectable in upper and lower respiratory specimens during the acute phase of infection. The lowest concentration of SARS-CoV-2 viral copies this assay can detect is 250 copies / mL. A negative result does not preclude SARS-CoV-2 infection and should not be used as the sole basis for treatment or other patient management decisions.  A negative result may occur with improper specimen collection / handling, submission of specimen other than nasopharyngeal swab, presence of viral mutation(s) within the areas targeted by this assay, and inadequate number of viral copies (<250 copies / mL). A negative result must be combined with clinical observations, patient history, and epidemiological information.  Fact Sheet for Patients:   https://www.patel.info/  Fact Sheet for Healthcare Providers: https://hall.com/  This test is not yet approved or                           cleared by the [place] FDA and has been authorized for detection and/or diagnosis of SARS-CoV-2 by FDA under an Emergency Use  Authorization (EUA).  This EUA will remain in effect (meaning this test can be used) for the duration of the COVID-19 declaration under Section 564(b)(1) of the Act, 21 U.S.C. section 360bbb-3(b)(1), unless the authorization is terminated or revoked sooner.  Performed at [facility], [place] [address], [place], [place] [address]    B Natriuretic Peptide 01/23/2022 41.3  0.0 - 100.0 pg/mL Final   Performed at [place] [address], [place], [place] [address]   Group A Strep by PCR 01/23/2022 NOT DETECTED  NOT DETECTED Final   Performed at [facility], [place] [address], [place], [place] [address]   Color, Urine 01/23/2022 YELLOW  YELLOW Final   APPearance 01/23/2022 CLEAR  CLEAR Final   Specific Gravity, Urine 01/23/2022 1.018  1.005 - 1.030 Final   pH 01/23/2022 7.0  5.0 - 8.0 Final   Glucose, UA 01/23/2022 NEGATIVE  NEGATIVE mg/dL Final   Hgb urine dipstick 01/23/2022 NEGATIVE  NEGATIVE Final   Bilirubin Urine 01/23/2022 NEGATIVE  NEGATIVE Final   Ketones, ur 01/23/2022 NEGATIVE  NEGATIVE mg/dL Final   Protein, ur 01/23/2022 NEGATIVE  NEGATIVE mg/dL Final   Nitrite 01/23/2022 NEGATIVE  NEGATIVE Final   Leukocytes,Ua 01/23/2022 TRACE (A)  NEGATIVE Final   RBC / HPF 01/23/2022 0-5  0 - 5 RBC/hpf Final   WBC, UA 01/23/2022 0-5  0 - 5 WBC/hpf Final   Bacteria, UA 01/23/2022 NONE SEEN  NONE SEEN Final   Squamous Epithelial / LPF 01/23/2022 0-5  0 - 5 Final   Mucus 01/23/2022 PRESENT   Final   Performed at [facility], [place] [address] [place], [place] [address]   SARS Coronavirus 2 by RT PCR 01/23/2022 NEGATIVE  NEGATIVE Final   Comment: (NOTE) SARS-CoV-2 target nucleic acids are NOT DETECTED.  The SARS-CoV-2 RNA is generally detectable in upper respiratory specimens during the acute phase of infection. The lowest concentration of SARS-CoV-2 viral copies this assay can detect is 138 copies/mL. A negative result does not preclude SARS-Cov-2 infection and  should not be used as the sole basis for treatment or other patient management decisions. A negative result may occur with  improper specimen collection/handling, submission of specimen other than nasopharyngeal swab, presence of viral mutation(s) within the areas targeted by this assay, and inadequate number of viral copies(<138 copies/mL). A negative result must be combined with clinical observations, patient history, and epidemiological information. The expected result is Negative.  Fact Sheet for Patients:  EntrepreneurPulse.com.au  Fact Sheet for Healthcare Providers:  IncredibleEmployment.be  This test is no                          t yet approved or cleared by the [place] FDA and  has been authorized for detection and/or diagnosis of SARS-CoV-2 by FDA under an Emergency Use Authorization (EUA). This EUA will remain  in effect (meaning this test can be used) for the duration of the COVID-19 declaration under Section 564(b)(1) of the Act, 21 U.S.C.section 360bbb-3(b)(1), unless the authorization is terminated  or revoked sooner.       Influenza A by PCR 01/23/2022 NEGATIVE  NEGATIVE Final   Influenza B by PCR 01/23/2022 NEGATIVE  NEGATIVE Final   Comment: (NOTE) The Xpert Xpress SARS-CoV-2/FLU/RSV plus assay is intended as an aid in the diagnosis of influenza from Nasopharyngeal swab specimens and should not be used as a sole basis for treatment. Nasal washings and aspirates are unacceptable for Xpert Xpress SARS-CoV-2/FLU/RSV testing.  Fact Sheet for Patients: EntrepreneurPulse.com.au  Fact Sheet for Healthcare Providers: IncredibleEmployment.be  This test is not yet approved or cleared by the [place] FDA and has been authorized for detection and/or diagnosis of SARS-CoV-2 by FDA under an Emergency Use Authorization (EUA). This EUA will remain in effect (meaning this test can be used)  for the duration of the COVID-19 declaration under Section 564(b)(1) of the Act, 21 U.S.C. section 360bbb-3(b)(1), unless the authorization is terminated or revoked.  Performed at [facility], [place] [address] [place], [place] [address]    WBC 01/24/2022 6.1  4.0 - 10.5 K/uL Final   RBC 01/24/2022 4.60  3.87 - 5.11 MIL/uL Final   Hemoglobin 01/24/2022 11.7 (L)  12.0 - 15.0 g/dL Final   HCT 01/24/2022 37.0  36.0 - 46.0 % Final   MCV 01/24/2022 80.4  80.0 - 100.0 fL Final   MCH 01/24/2022 25.4 (L)  26.0 - 34.0 pg Final   MCHC 01/24/2022 31.6  30.0 - 36.0 g/dL Final   RDW 01/24/2022 17.6 (H)  11.5 - 15.5 % Final   Platelets 01/24/2022 334  150 - 400 K/uL Final   nRBC 01/24/2022 0.0  0.0 - 0.2 % Final   Neutrophils Relative % 01/24/2022 53  % Final   Neutro Abs 01/24/2022 3.3  1.7 - 7.7  K/uL Final   Lymphocytes Relative 01/24/2022 33  % Final   Lymphs Abs 01/24/2022 2.0  0.7 - 4.0 K/uL Final   Monocytes Relative 01/24/2022 11  % Final   Monocytes Absolute 01/24/2022 0.7  0.1 - 1.0 K/uL Final   Eosinophils Relative 01/24/2022 2  % Final   Eosinophils Absolute 01/24/2022 0.1  0.0 - 0.5 K/uL Final   Basophils Relative 01/24/2022 1  % Final   Basophils Absolute 01/24/2022 0.1  0.0 - 0.1 K/uL Final   Immature Granulocytes 01/24/2022 0  % Final   Abs Immature Granulocytes 01/24/2022 0.02  0.00 - 0.07 K/uL Final   Performed at [place] [address], [place], [place] [address]   Glucose-Capillary 01/24/2022 110 (H)  70 - 99 mg/dL Final   Glucose reference range applies only to samples taken after fasting for at least 8 hours.  No results displayed because visit has over 200 results.    Admission on 11/22/2021, Discharged on 11/22/2021  Component Date Value Ref Range Status   Glucose-Capillary 11/22/2021 169 (H)  70 - 99 mg/dL Final   Glucose reference range applies only to samples taken after fasting for at least 8 hours.  No results displayed because visit has over 200  results.    Admission on 10/04/2021, Discharged on 10/22/2021  Component Date Value Ref Range Status   SARS Coronavirus 2 by RT PCR 10/04/2021 NEGATIVE  NEGATIVE Final   Comment: (NOTE) SARS-CoV-2 target nucleic acids are NOT DETECTED.  The SARS-CoV-2 RNA is generally detectable in upper respiratory specimens during the acute phase of infection. The lowest concentration of SARS-CoV-2 viral copies this assay can detect is 138 copies/mL. A negative result does not preclude SARS-Cov-2 infection and should not be used as the sole basis for treatment or other patient management decisions. A negative result may occur with  improper specimen collection/handling, submission of specimen other than nasopharyngeal swab, presence of viral mutation(s) within the areas targeted by this assay, and inadequate number of viral copies(<138 copies/mL). A negative result must be combined with clinical observations, patient history, and epidemiological information. The expected result is Negative.  Fact Sheet for Patients:  EntrepreneurPulse.com.au  Fact Sheet for Healthcare Providers:  IncredibleEmployment.be  This test is no                          t yet approved or cleared by the [place] FDA and  has been authorized for detection and/or diagnosis of SARS-CoV-2 by FDA under an Emergency Use Authorization (EUA). This EUA will remain  in effect (meaning this test can be used) for the duration of the COVID-19 declaration under Section 564(b)(1) of the Act, 21 U.S.C.section 360bbb-3(b)(1), unless the authorization is terminated  or revoked sooner.       Influenza A by PCR 10/04/2021 NEGATIVE  NEGATIVE Final   Influenza B by PCR 10/04/2021 NEGATIVE  NEGATIVE Final   Comment: (NOTE) The Xpert Xpress SARS-CoV-2/FLU/RSV plus assay is intended as an aid in the diagnosis of influenza from Nasopharyngeal swab specimens and should not be used as a sole basis for  treatment. Nasal washings and aspirates are unacceptable for Xpert Xpress SARS-CoV-2/FLU/RSV testing.  Fact Sheet for Patients: EntrepreneurPulse.com.au  Fact Sheet for Healthcare Providers: IncredibleEmployment.be  This test is not yet approved or cleared by the [place] FDA and has been authorized for detection and/or diagnosis of SARS-CoV-2 by FDA under an Emergency Use Authorization (EUA). This EUA will  remain in effect (meaning this test can be used) for the duration of the COVID-19 declaration under Section 564(b)(1) of the Act, 21 U.S.C. section 360bbb-3(b)(1), unless the authorization is terminated or revoked.  Performed at [facility], [place] [address], [place], [place] [address]    WBC 10/04/2021 8.4  4.0 - 10.5 K/uL Final   RBC 10/04/2021 4.42  3.87 - 5.11 MIL/uL Final   Hemoglobin 10/04/2021 11.6 (L)  12.0 - 15.0 g/dL Final   HCT 10/04/2021 35.7 (L)  36.0 - 46.0 % Final   MCV 10/04/2021 80.8  80.0 - 100.0 fL Final   MCH 10/04/2021 26.2  26.0 - 34.0 pg Final   MCHC 10/04/2021 32.5  30.0 - 36.0 g/dL Final   RDW 10/04/2021 16.0 (H)  11.5 - 15.5 % Final   Platelets 10/04/2021 372  150 - 400 K/uL Final   nRBC 10/04/2021 0.0  0.0 - 0.2 % Final   Neutrophils Relative % 10/04/2021 68  % Final   Neutro Abs 10/04/2021 5.7  1.7 - 7.7 K/uL Final   Lymphocytes Relative 10/04/2021 22  % Final   Lymphs Abs 10/04/2021 1.8  0.7 - 4.0 K/uL Final   Monocytes Relative 10/04/2021 8  % Final   Monocytes Absolute 10/04/2021 0.7  0.1 - 1.0 K/uL Final   Eosinophils Relative 10/04/2021 1  % Final   Eosinophils Absolute 10/04/2021 0.1  0.0 - 0.5 K/uL Final   Basophils Relative 10/04/2021 1  % Final   Basophils Absolute 10/04/2021 0.1  0.0 - 0.1 K/uL Final   Immature Granulocytes 10/04/2021 0  % Final   Abs Immature Granulocytes 10/04/2021 0.03  0.00 - 0.07 K/uL Final   Performed at [facility], [place] [address], [place], [place] [address]    Sodium 10/04/2021 136  135 - 145 mmol/L Final   Potassium 10/04/2021 4.2  3.5 - 5.1 mmol/L Final   Chloride 10/04/2021 104  98 - 111 mmol/L Final   CO2 10/04/2021 25  22 - 32 mmol/L Final   Glucose, Bld 10/04/2021 100 (H)  70 - 99 mg/dL Final   Glucose reference range applies only to samples taken after fasting for at least 8 hours.   BUN 10/04/2021 9  6 - 20 mg/dL Final   Creatinine, Ser 10/04/2021 0.52  0.44 - 1.00 mg/dL Final   Calcium 10/04/2021 9.0  8.9 - 10.3 mg/dL Final   Total Protein 10/04/2021 7.0  6.5 - 8.1 g/dL Final   Albumin 10/04/2021 3.2 (L)  3.5 - 5.0 g/dL Final   AST 10/04/2021 13 (L)  15 - 41 U/L Final   ALT 10/04/2021 10  0 - 44 U/L Final   Alkaline Phosphatase 10/04/2021 61  38 - 126 U/L Final   Total Bilirubin 10/04/2021 0.3  0.3 - 1.2 mg/dL Final   GFR, Estimated 10/04/2021 >60  >60 mL/min Final   Comment: (NOTE) Calculated using the CKD-EPI Creatinine Equation (2021)    Anion gap 10/04/2021 7  5 - 15 Final   Performed at [facility] [address] [place], [place] [address]   Hgb A1c MFr Bld 10/04/2021 7.0 (H)  4.8 - 5.6 % Final   Comment: (NOTE) Pre diabetes:          5.7%-6.4%  Diabetes:              >6.4%  Glycemic control for   <7.0% adults with diabetes    Mean Plasma Glucose 10/04/2021 154.2  mg/dL Final  Performed at [facility], [place] [address] [place], [place] [address]   Cholesterol 10/04/2021 178  0 - 200 mg/dL Final   Triglycerides 10/04/2021 155 (H)  <150 mg/dL Final   HDL 10/04/2021 52  >40 mg/dL Final   Total CHOL/HDL Ratio 10/04/2021 3.4  RATIO Final   VLDL 10/04/2021 31  0 - 40 mg/dL Final   LDL Cholesterol 10/04/2021 95  0 - 99 mg/dL Final   Comment:        Total Cholesterol/HDL:CHD Risk Coronary Heart Disease Risk Table                     Men   Women  1/2 Average Risk   3.4   3.3  Average Risk       5.0   4.4  2 X Average Risk   9.6   7.1  3 X Average Risk  23.4   11.0        Use the calculated Patient  Ratio above and the CHD Risk Table to determine the patient's CHD Risk.        ATP III CLASSIFICATION (LDL):  <100     mg/dL   Optimal  100-129  mg/dL   Near or Above                    Optimal  130-159  mg/dL   Borderline  160-189  mg/dL   High  >190     mg/dL   Very High Performed at [facility] [address] [place], [place] [address]    POC Amphetamine UR 10/04/2021 None Detected  NONE DETECTED (Cut Off Level 1000 ng/mL) Final   POC Secobarbital (BAR) 10/04/2021 None Detected  NONE DETECTED (Cut Off Level 300 ng/mL) Final   POC Buprenorphine (BUP) 10/04/2021 None Detected  NONE DETECTED (Cut Off Level 10 ng/mL) Final   POC Oxazepam (BZO) 10/04/2021 None Detected  NONE DETECTED (Cut Off Level 300 ng/mL) Final   POC Cocaine UR 10/04/2021 None Detected  NONE DETECTED (Cut Off Level 300 ng/mL) Final   POC Methamphetamine UR 10/04/2021 None Detected  NONE DETECTED (Cut Off Level 1000 ng/mL) Final   POC Morphine 10/04/2021 None Detected  NONE DETECTED (Cut Off Level 300 ng/mL) Final   POC Methadone UR 10/04/2021 None Detected  NONE DETECTED (Cut Off Level 300 ng/mL) Final   POC Oxycodone UR 10/04/2021 Positive (A)  NONE DETECTED (Cut Off Level 100 ng/mL) Final   POC Marijuana UR 10/04/2021 None Detected  NONE DETECTED (Cut Off Level 50 ng/mL) Final   SARSCOV2ONAVIRUS 2 AG 10/04/2021 NEGATIVE  NEGATIVE Final   Comment: (NOTE) SARS-CoV-2 antigen NOT DETECTED.   Negative results are presumptive.  Negative results do not preclude SARS-CoV-2 infection and should not be used as the sole basis for treatment or other patient management decisions, including infection  control decisions, particularly in the presence of clinical signs and  symptoms consistent with COVID-19, or in those who have been in contact with the virus.  Negative results must be combined with clinical observations, patient history, and epidemiological information. The expected result is Negative.  Fact Sheet  for Patients: HandmadeRecipes.com.cy  Fact Sheet for Healthcare Providers: FuneralLife.at  This test is not yet approved or cleared by the [place] FDA and  has been authorized for detection and/or diagnosis of SARS-CoV-2 by FDA under an Emergency Use Authorization (EUA).  This EUA will remain in effect (meaning this test can be  used) for the duration of  the COV                          ID-19 declaration under Section 564(b)(1) of the Act, 21 U.S.C. section 360bbb-3(b)(1), unless the authorization is terminated or revoked sooner.     Valproic Acid Lvl 10/04/2021 51  50.0 - 100.0 ug/mL Final   Performed at [facility] [address], [place], [place] [address]   Valproic Acid Lvl 10/08/2021 57  50.0 - 100.0 ug/mL Final   Performed at [facility] [address], [place], [place] [address]   Glucose-Capillary 10/09/2021 123 (H)  70 - 99 mg/dL Final   Glucose reference range applies only to samples taken after fasting for at least 8 hours.  Admission on 09/09/2021, Discharged on 09/10/2021  Component Date Value Ref Range Status   Sodium 09/09/2021 134 (L)  135 - 145 mmol/L Final   Potassium 09/09/2021 4.3  3.5 - 5.1 mmol/L Final   Chloride 09/09/2021 99  98 - 111 mmol/L Final   CO2 09/09/2021 25  22 - 32 mmol/L Final   Glucose, Bld 09/09/2021 107 (H)  70 - 99 mg/dL Final   Glucose reference range applies only to samples taken after fasting for at least 8 hours.   BUN 09/09/2021 12  6 - 20 mg/dL Final   Creatinine, Ser 09/09/2021 0.74  0.44 - 1.00 mg/dL Final   Calcium 09/09/2021 9.0  8.9 - 10.3 mg/dL Final   Total Protein 09/09/2021 7.4  6.5 - 8.1 g/dL Final   Albumin 09/09/2021 3.1 (L)  3.5 - 5.0 g/dL Final   AST 09/09/2021 13 (L)  15 - 41 U/L Final   ALT 09/09/2021 13  0 - 44 U/L Final   Alkaline Phosphatase 09/09/2021 66  38 - 126 U/L Final   Total Bilirubin 09/09/2021 0.2 (L)  0.3 - 1.2 mg/dL Final   GFR,  Estimated 09/09/2021 >60  >60 mL/min Final   Comment: (NOTE) Calculated using the CKD-EPI Creatinine Equation (2021)    Anion gap 09/09/2021 10  5 - 15 Final   Performed at [facility], [place] [address], [place], [place] [address]   WBC 09/09/2021 8.5  4.0 - 10.5 K/uL Final   RBC 09/09/2021 4.53  3.87 - 5.11 MIL/uL Final   Hemoglobin 09/09/2021 11.8 (L)  12.0 - 15.0 g/dL Final   HCT 09/09/2021 38.0  36.0 - 46.0 % Final   MCV 09/09/2021 83.9  80.0 - 100.0 fL Final   MCH 09/09/2021 26.0  26.0 - 34.0 pg Final   MCHC 09/09/2021 31.1  30.0 - 36.0 g/dL Final   RDW 09/09/2021 17.8 (H)  11.5 - 15.5 % Final   Platelets 09/09/2021 442 (H)  150 - 400 K/uL Final   nRBC 09/09/2021 0.0  0.0 - 0.2 % Final   Neutrophils Relative % 09/09/2021 70  % Final   Neutro Abs 09/09/2021 5.9  1.7 - 7.7 K/uL Final   Lymphocytes Relative 09/09/2021 20  % Final   Lymphs Abs 09/09/2021 1.7  0.7 - 4.0 K/uL Final   Monocytes Relative 09/09/2021 8  % Final   Monocytes Absolute 09/09/2021 0.7  0.1 - 1.0 K/uL Final   Eosinophils Relative 09/09/2021 1  % Final   Eosinophils Absolute 09/09/2021 0.1  0.0 - 0.5 K/uL Final   Basophils Relative 09/09/2021 1  % Final   Basophils Absolute 09/09/2021 0.1  0.0 -  0.1 K/uL Final   Immature Granulocytes 09/09/2021 0  % Final   Abs Immature Granulocytes 09/09/2021 0.03  0.00 - 0.07 K/uL Final   Performed at [facility] [address], [place], [place] [address]   Ammonia 09/09/2021 37 (H)  9 - 35 umol/L Final   Comment: HEMOLYSIS AT THIS LEVEL MAY AFFECT RESULT Performed at [facility], [place] [address], [place], [place] [address]    Opiates 09/09/2021 NONE DETECTED  NONE DETECTED Final   Cocaine 09/09/2021 NONE DETECTED  NONE DETECTED Final   Benzodiazepines 09/09/2021 NONE DETECTED  NONE DETECTED Final   Amphetamines 09/09/2021 NONE DETECTED  NONE DETECTED Final   Tetrahydrocannabinol 09/09/2021 NONE DETECTED  NONE DETECTED Final   Barbiturates 09/09/2021  NONE DETECTED  NONE DETECTED Final   Comment: (NOTE) DRUG SCREEN FOR MEDICAL PURPOSES ONLY.  IF CONFIRMATION IS NEEDED FOR ANY PURPOSE, NOTIFY LAB WITHIN 5 DAYS.  LOWEST DETECTABLE LIMITS FOR URINE DRUG SCREEN Drug Class                     Cutoff (ng/mL) Amphetamine and metabolites    1000 Barbiturate and metabolites    200 Benzodiazepine                 858 Tricyclics and metabolites     300 Opiates and metabolites        300 Cocaine and metabolites        300 THC                            50 Performed at [facility], [place] [address] [place], [place] [address]    Alcohol, Ethyl (B) 09/09/2021 <10  <10 mg/dL Final   Comment: (NOTE) Lowest detectable limit for serum alcohol is 10 mg/dL.  For medical purposes only. Performed at [facility], [place] [address] [place], [place] [address]    Lipase 09/09/2021 33  11 - 51 U/L Final   Performed at [facility] [address], [place], [place] [address]   Color, Urine 09/09/2021 COLORLESS (A)  YELLOW Final   APPearance 09/09/2021 CLEAR  CLEAR Final   Specific Gravity, Urine 09/09/2021 1.002 (L)  1.005 - 1.030 Final   pH 09/09/2021 6.0  5.0 - 8.0 Final   Glucose, UA 09/09/2021 NEGATIVE  NEGATIVE mg/dL Final   Hgb urine dipstick 09/09/2021 NEGATIVE  NEGATIVE Final   Bilirubin Urine 09/09/2021 NEGATIVE  NEGATIVE Final   Ketones, ur 09/09/2021 NEGATIVE  NEGATIVE mg/dL Final   Protein, ur 09/09/2021 NEGATIVE  NEGATIVE mg/dL Final   Nitrite 09/09/2021 NEGATIVE  NEGATIVE Final   Leukocytes,Ua 09/09/2021 NEGATIVE  NEGATIVE Final   Performed at [facility] [address], [place], [place] [address]   Specimen Description 09/09/2021 URINE, CLEAN CATCH   Final   Special Requests 09/09/2021 NONE   Final   Culture 09/09/2021  (A)   Final                   Value:<10,000 COLONIES/mL INSIGNIFICANT GROWTH Performed at [facility], [place] [address], [place], [place] [address]    Report Status 09/09/2021  09/10/2021 FINAL   Final   D-Dimer, Quant 09/09/2021 0.92 (H)  0.00 - 0.50 ug/mL-FEU Final   Comment: (NOTE) At the manufacturer cut-off value of 0.5 g/mL FEU, this assay has a negative predictive value of 95-100%.This assay is  intended for use in conjunction with a clinical pretest probability (PTP) assessment model to exclude pulmonary embolism (PE) and deep venous thrombosis (DVT) in outpatients suspected of PE or DVT. Results should be correlated with clinical presentation. Performed at [facility], [place] [address], [place], [place] [address]    Troponin I (High Sensitivity) 09/09/2021 5  <18 ng/L Final   Comment: (NOTE) Elevated high sensitivity troponin I (hsTnI) values and significant  changes across serial measurements may suggest ACS but many other  chronic and acute conditions are known to elevate hsTnI results.  Refer to the "Links" section for chest pain algorithms and additional  guidance. Performed at [facility], [place] [address], [place], [place] [address]    BP 09/10/2021 135/83  mmHg Final   S' Lateral 09/10/2021 2.30  cm Final   Area-P 1/2 09/10/2021 5.58  cm2 Final    Blood Alcohol level:  Lab Results  Component Value Date   ETH <10 11/23/2021   ETH <10 47/10/6281    Metabolic Disorder Labs: Lab Results  Component Value Date   HGBA1C 6.7 (H) 12/28/2021   MPG 145.59 12/28/2021   MPG 154.2 10/04/2021   Lab Results  Component Value Date   PROLACTIN 3.8 (L) 11/23/2021   Lab Results  Component Value Date   CHOL 171 11/23/2021   TRIG 125 11/23/2021   HDL 65 11/23/2021   CHOLHDL 2.6 11/23/2021   VLDL 25 11/23/2021   LDLCALC 81 11/23/2021   LDLCALC 95 10/04/2021    Therapeutic Lab Levels: No results found for: "LITHIUM" Lab Results  Component Value Date   VALPROATE 33 (L) 01/18/2022   VALPROATE 33 (L) 12/28/2021   No results found for: "CBMZ"  Physical Findings   PHQ2-9    Flowsheet Row ED from 11/23/2021 in [facility]  PHQ-2 Total Score 2  PHQ-9 Total Score 3      Flowsheet Row ED from 01/23/2022 in [facility] from 11/23/2021 in [facility] ED from 11/22/2021 in [facility] DEPT  C-SSRS RISK CATEGORY No Risk No Risk No Risk        Musculoskeletal  Strength & Muscle Tone: within normal limits Gait & Station: normal Patient leans: N/A  Psychiatric Specialty Exam  Presentation  General Appearance:  Appropriate for Environment; Casual  Eye Contact: Good  Speech: Clear and Coherent; Normal Rate  Speech Volume: Normal  Handedness: Right   Mood and Affect  Mood: Depressed  Affect: Congruent   Thought Process  Thought Processes: Coherent; Goal Directed  Descriptions of Associations:Intact  Orientation:Full (Time, Place and Person)  Thought Content:WDL; Logical  Diagnosis of Schizophrenia or Schizoaffective disorder in past: Yes  Duration of Psychotic Symptoms: No data recorded  Hallucinations:Hallucinations: None  Ideas of Reference:None  Suicidal Thoughts:Suicidal Thoughts: No  Homicidal Thoughts:Homicidal Thoughts: No   Sensorium  Memory: Immediate Good; Recent Fair; Remote Fair  Judgment: Fair  Insight: Present   Executive Functions  Concentration: Good  Attention Span: Good  Recall: Good  Fund of Knowledge: Fair  Language: Fair   Psychomotor Activity  Psychomotor Activity: Psychomotor Activity: Normal   Assets  Assets: Communication Skills; Desire for Improvement; Resilience   Sleep  Sleep: Sleep: Poor   No data recorded  Physical Exam  Physical Exam Constitutional:      Appearance: Normal appearance.  HENT:     Head: Normocephalic and atraumatic.     Nose: Nose  normal.     Mouth/Throat:     Mouth: Mucous membranes are moist.  Eyes:     Extraocular Movements: Extraocular movements intact.      Pupils: Pupils are equal, round, and reactive to light.  Cardiovascular:     Rate and Rhythm: Normal rate and regular rhythm.  Pulmonary:     Effort: Pulmonary effort is normal.     Breath sounds: Normal breath sounds.  Abdominal:     General: Abdomen is flat.  Musculoskeletal:        General: Normal range of motion.     Cervical back: Normal range of motion and neck supple.  Skin:    General: Skin is warm and dry.  Neurological:     General: No focal deficit present.     Mental Status: She is alert and oriented to person, place, and time.  Psychiatric:        Attention and Perception: Attention and perception normal. She does not perceive auditory or visual hallucinations.        Mood and Affect: Mood is depressed. Affect is blunt.        Speech: Speech normal.        Behavior: Behavior normal. Behavior is cooperative.        Thought Content: Thought content is paranoid. Thought content is not delusional. Thought content does not include homicidal or suicidal ideation.        Cognition and Memory: Cognition and memory normal.        Judgment: Judgment normal.    Review of Systems  Constitutional: Negative.   HENT: Negative.    Eyes: Negative.   Respiratory: Negative.    Cardiovascular: Negative.   Gastrointestinal: Negative.   Skin: Negative.   Neurological: Negative.   Psychiatric/Behavioral:  Positive for depression. Negative for hallucinations, substance abuse and suicidal ideas. The patient is not nervous/anxious and does not have insomnia.    Blood pressure 123/80, pulse 99, temperature 98.2 F (36.8 C), temperature source Oral, resp. rate 16, SpO2 100 %. There is no height or weight on file to calculate BMI.  Treatment Plan Summary: Daily contact with patient to assess and evaluate symptoms and progress in treatment and Medication management  Malachy Mood, PA 01/30/2022 11:55 AM

## 2022-01-30 NOTE — ED Notes (Signed)
Pt sleeping at present, no distress noted.  Monitoring for safety. 

## 2022-01-31 DIAGNOSIS — F209 Schizophrenia, unspecified: Secondary | ICD-10-CM | POA: Diagnosis not present

## 2022-01-31 LAB — GLUCOSE, CAPILLARY
Glucose-Capillary: 147 mg/dL — ABNORMAL HIGH (ref 70–99)
Glucose-Capillary: 131 mg/dL — ABNORMAL HIGH (ref 70–99)
Glucose-Capillary: 169 mg/dL — ABNORMAL HIGH (ref 70–99)
Glucose-Capillary: 144 mg/dL — ABNORMAL HIGH (ref 70–99)

## 2022-01-31 NOTE — ED Provider Notes (Signed)
Behavioral Health Progress Note  Date and Time: 01/31/2022 4:33 PM Name: Teresa Murillo MRN:  [id number]  Subjective:  Teresa Murillo 61 y.o., female patient presented Teresa Murillo 61 y.o., female patient with a past psychiatric history of schizophrenia, aggressive behavior, and borderline intellectual disability who initially presented to [facility] on 11/23/2021 via [facility].  At that time patient had been dismissed from the group home due to multiple elopements.  Patient has remained at the [facility] while awaiting new placement.  Social work and DSS are involved   Teresa Murillo, 61 y.o., female patient seen face to face by this provider and chart reviewed on 01/31/22.    Upon approach Teresa Murillo is sitting in her bed awake no acute distress.  She is disheveled and makes good eye contact.  She is alert/oriented x 4, calm, cooperative, and attentive.  She is pleasant and engages in the assessment.  Reports her mood as being depressed today and she has a depressed affect. She expresses her frustration of being on the unit for so long and states she is bored.  She is endorsing depression related to being unable to leave. Reassurance and encouragement provided.  She denies any concerns with appetite or sleep.  She denies SI/HI/AVH.  She does not appear to be responding to internal/external stimuli.   She continues to remain calm and cooperative while on the unit.  She is appropriate with staff and other patients.  She is continues to be compliant with medications.  Diabetes consult placed to review medications.      Diagnosis:  Final diagnoses:  Schizophrenia, unspecified type ([place])  At risk for self care deficit  Noncompliance  Type 2 diabetes mellitus without complication, unspecified whether long term insulin use ([place])    Total Time spent with patient: 30 minutes  Past Psychiatric History: see H&P Past Medical History:  Past Medical History:  Diagnosis Date   Borderline intellectual  functioning 12/14/2021   On 12/14/2021: Appreciate assistance from psychology consult. On the Wechsler Adult Intelligence Scale-4, Teresa Murillo achieved a full-scale IQ score of 73 and a percentile rank of 4 placing her in the borderline range of intellectual functioning.    Chronic obstructive pulmonary disease (COPD) (HCC)    Glaucoma    Hyperlipidemia    Hypertension    Iron deficiency    Schizoaffective disorder (HCC)    Type 2 diabetes mellitus (HCC)     Past Surgical History:  Procedure Laterality Date   TUBAL LIGATION     Family History:  Family History  Problem Relation Age of Onset   Breast cancer Maternal Grandmother    Family Psychiatric  History: See h&P Social History:  Social History   Substance and Sexual Activity  Alcohol Use Yes     Social History   Substance and Sexual Activity  Drug Use Not Currently    Social History   Socioeconomic History   Marital status: Divorced    Spouse name: Not on file   Number of children: Not on file   Years of education: Not on file   Highest education level: Not on file  Occupational History   Not on file  Tobacco Use   Smoking status: Every Day    Packs/day: 1.00    Types: Cigars, Cigarettes   Smokeless tobacco: Current  Vaping Use   Vaping Use: Never used  Substance and Sexual Activity   Alcohol use: Yes   Drug use: Not Currently   Sexual activity: Not Currently  Birth control/protection: Surgical  Other Topics Concern   Not on file  Social History Narrative   Not on file   Social Determinants of Health   Financial Resource Strain: Not on file  Food Insecurity: Not on file  Transportation Needs: Not on file  Physical Activity: Not on file  Stress: Not on file  Social Connections: Not on file   SDOH:  SDOH Screenings   Depression (PHQ2-9): Low Risk  (11/23/2021)  Tobacco Use: High Risk (01/26/2022)   Additional Social History:                         Sleep: Good  Appetite:   Good  Current Medications:  Current Facility-Administered Medications  Medication Dose Route Frequency Provider Last Rate Last Admin   acetaminophen (TYLENOL) tablet 650 mg  650 mg Oral Q6H PRN Revonda Humphrey, NP   650 mg at 01/31/22 1548   albuterol (VENTOLIN HFA) 108 (90 Base) MCG/ACT inhaler 2 puff  2 puff Inhalation Q6H PRN Revonda Humphrey, NP       alum & mag hydroxide-simeth (MAALOX/MYLANTA) 200-200-20 MG/5ML suspension 30 mL  30 mL Oral Q4H PRN Revonda Humphrey, NP       apixaban Arne Cleveland) tablet 5 mg  5 mg Oral BID Revonda Humphrey, NP   5 mg at 01/31/22 0816   ARIPiprazole (ABILIFY) tablet 10 mg  10 mg Oral Daily Revonda Humphrey, NP   10 mg at 01/31/22 0816   cloZAPine (CLOZARIL) tablet 50 mg  50 mg Oral Daily Revonda Humphrey, NP   50 mg at 01/31/22 0816   cloZAPine (CLOZARIL) tablet 75 mg  75 mg Oral QHS Revonda Humphrey, NP   75 mg at 01/30/22 2142   diltiazem (CARDIZEM CD) 24 hr capsule 240 mg  240 mg Oral Daily Revonda Humphrey, NP   240 mg at 01/31/22 0816   divalproex (DEPAKOTE ER) 24 hr tablet 500 mg  500 mg Oral BID Revonda Humphrey, NP   500 mg at 01/31/22 0816   fluticasone furoate-vilanterol (BREO ELLIPTA) 200-25 MCG/ACT 1 puff  1 puff Inhalation Daily Revonda Humphrey, NP   1 puff at 01/31/22 0817   haloperidol (HALDOL) tablet 10 mg  10 mg Oral TID PRN Revonda Humphrey, NP       hydrOXYzine (ATARAX) tablet 25 mg  25 mg Oral TID PRN Revonda Humphrey, NP   25 mg at 01/27/22 0156   insulin aspart (novoLOG) injection 0-9 Units  0-9 Units Subcutaneous TID WC Revonda Humphrey, NP   1 Units at 01/31/22 1202   insulin glargine-yfgn (SEMGLEE) injection 8 Units  8 Units Subcutaneous Q1200 Revonda Humphrey, NP   8 Units at 01/31/22 1202   latanoprost (XALATAN) 0.005 % ophthalmic solution 1 drop  1 drop Both Eyes QHS Thomes Lolling H, NP   1 drop at 01/30/22 2145   magnesium hydroxide (MILK OF MAGNESIA) suspension 30 mL  30 mL Oral Daily PRN Revonda Humphrey, NP       menthol-cetylpyridinium (CEPACOL) lozenge 3 mg  1 lozenge Oral TID PRN Onuoha, Chinwendu V, NP   3 mg at 01/31/22 1330   metFORMIN (GLUCOPHAGE-XR) 24 hr tablet 1,000 mg  1,000 mg Oral BID WC Revonda Humphrey, NP   1,000 mg at 01/31/22 0816   nicotine (NICODERM CQ - dosed in mg/24 hr) patch 7 mg  7 mg Transdermal Daily Revonda Humphrey,  NP   7 mg at 01/31/22 0817   traZODone (DESYREL) tablet 50 mg  50 mg Oral QHS PRN Revonda Humphrey, NP   50 mg at 01/27/22 0156   valbenazine (INGREZZA) capsule 40 mg  40 mg Oral q AM Revonda Humphrey, NP   40 mg at 01/31/22 0606   Vitamin D (Ergocalciferol) (DRISDOL) 1.25 MG (50000 UNIT) capsule 50,000 Units  50,000 Units Oral Q Sharren Bridge, NP   50,000 Units at 01/28/22 3419   Current Outpatient Medications  Medication Sig Dispense Refill   Accu-Chek Softclix Lancets lancets Use as directed up to four times daily 100 each 0   apixaban (ELIQUIS) 5 MG TABS tablet Take 1 tablet (5 mg total) by mouth 2 (two) times daily. 60 tablet 0   ARIPiprazole (ABILIFY) 10 MG tablet Take 1 tablet (10 mg total) by mouth daily. 30 tablet 0   Blood Glucose Monitoring Suppl (ACCU-CHEK GUIDE) w/Device KIT Use as directed up to four times daily 1 kit 0   budesonide-formoterol (SYMBICORT) 160-4.5 MCG/ACT inhaler Inhale 2 puffs into the lungs in the morning and at bedtime.     Cholecalciferol (VITAMIN D3) 1.25 MG (50000 UT) CAPS Take 50,000 Units by mouth every Thursday.     cloZAPine (CLOZARIL) 25 MG tablet Take 3 tablets (75 mg total) by mouth at bedtime. 90 tablet 0   clozapine (CLOZARIL) 50 MG tablet Take 1 tablet (50 mg total) by mouth daily. 30 tablet 0   diltiazem (CARDIZEM CD) 240 MG 24 hr capsule Take 1 capsule (240 mg total) by mouth daily. (Patient not taking: Reported on 11/24/2021) 30 capsule 0   divalproex (DEPAKOTE ER) 500 MG 24 hr tablet Take 1 tablet (500 mg total) by mouth 2 (two) times daily. 60 tablet 0   glucose blood test strip  Use as directed up to four times daily 50 each 0   haloperidol (HALDOL) 10 MG tablet Take 1 tablet (10 mg total) by mouth 3 (three) times daily as needed for agitation (and psychotic symptoms).     INGREZZA 40 MG capsule Take 1 capsule (40 mg total) by mouth in the morning. 30 capsule 0   insulin aspart (NOVOLOG) 100 UNIT/ML FlexPen Before each meal 3 times a day, 140-199 - 2 units, 200-250 - 4 units, 251-299 - 6 units,  300-349 - 8 units,  350 or above 10 units. Insulin PEN if approved, provide syringes and needles if needed.Please switch to any approved short acting Insulin if needed. 15 mL 0   insulin glargine (LANTUS) 100 UNIT/ML Solostar Pen Inject 12 Units into the skin daily. 15 mL 0   Insulin Pen Needle 32G X 4 MM MISC Use 4 times a day with insulin, 1 month supply. 100 each 0   latanoprost (XALATAN) 0.005 % ophthalmic solution Place 1 drop into both eyes at bedtime.     metFORMIN (GLUCOPHAGE) 500 MG tablet Take 500 mg by mouth 2 (two) times daily with a meal.     nicotine (NICODERM CQ - DOSED IN MG/24 HR) 7 mg/24hr patch Place 1 patch (7 mg total) onto the skin daily. 28 patch 0   PROAIR HFA 108 (90 Base) MCG/ACT inhaler Inhale 2 puffs into the lungs every 6 (six) hours as needed for wheezing or shortness of breath.      Labs  Lab Results:  Admission on 01/24/2022  Component Date Value Ref Range Status   Glucose-Capillary 01/24/2022 143 (H)  70 - 99 mg/dL Final  Glucose reference range applies only to samples taken after fasting for at least 8 hours.   Glucose-Capillary 01/24/2022 120 (H)  70 - 99 mg/dL Final   Glucose reference range applies only to samples taken after fasting for at least 8 hours.   Glucose-Capillary 01/25/2022 122 (H)  70 - 99 mg/dL Final   Glucose reference range applies only to samples taken after fasting for at least 8 hours.   Glucose-Capillary 01/25/2022 190 (H)  70 - 99 mg/dL Final   Glucose reference range applies only to samples taken after fasting for at  least 8 hours.   Glucose-Capillary 01/25/2022 163 (H)  70 - 99 mg/dL Final   Glucose reference range applies only to samples taken after fasting for at least 8 hours.   WBC 01/26/2022 6.5  4.0 - 10.5 K/uL Final   RBC 01/26/2022 4.53  3.87 - 5.11 MIL/uL Final   Hemoglobin 01/26/2022 11.3 (L)  12.0 - 15.0 g/dL Final   HCT 01/26/2022 36.4  36.0 - 46.0 % Final   MCV 01/26/2022 80.4  80.0 - 100.0 fL Final   MCH 01/26/2022 24.9 (L)  26.0 - 34.0 pg Final   MCHC 01/26/2022 31.0  30.0 - 36.0 g/dL Final   RDW 01/26/2022 17.3 (H)  11.5 - 15.5 % Final   Platelets 01/26/2022 327  150 - 400 K/uL Final   nRBC 01/26/2022 0.0  0.0 - 0.2 % Final   Neutrophils Relative % 01/26/2022 60  % Final   Neutro Abs 01/26/2022 3.9  1.7 - 7.7 K/uL Final   Lymphocytes Relative 01/26/2022 31  % Final   Lymphs Abs 01/26/2022 2.0  0.7 - 4.0 K/uL Final   Monocytes Relative 01/26/2022 7  % Final   Monocytes Absolute 01/26/2022 0.5  0.1 - 1.0 K/uL Final   Eosinophils Relative 01/26/2022 1  % Final   Eosinophils Absolute 01/26/2022 0.1  0.0 - 0.5 K/uL Final   Basophils Relative 01/26/2022 1  % Final   Basophils Absolute 01/26/2022 0.1  0.0 - 0.1 K/uL Final   Immature Granulocytes 01/26/2022 0  % Final   Abs Immature Granulocytes 01/26/2022 0.02  0.00 - 0.07 K/uL Final   Performed at [facility], [place] [address], [place], [place] [address]   Glucose-Capillary 01/26/2022 149 (H)  70 - 99 mg/dL Final   Glucose reference range applies only to samples taken after fasting for at least 8 hours.   Glucose-Capillary 01/26/2022 130 (H)  70 - 99 mg/dL Final   Glucose reference range applies only to samples taken after fasting for at least 8 hours.   Glucose-Capillary 01/26/2022 151 (H)  70 - 99 mg/dL Final   Glucose reference range applies only to samples taken after fasting for at least 8 hours.   Glucose-Capillary 01/26/2022 170 (H)  70 - 99 mg/dL Final   Glucose reference range applies only to samples taken after fasting  for at least 8 hours.   Glucose-Capillary 01/27/2022 129 (H)  70 - 99 mg/dL Final   Glucose reference range applies only to samples taken after fasting for at least 8 hours.   Glucose-Capillary 01/27/2022 101 (H)  70 - 99 mg/dL Final   Glucose reference range applies only to samples taken after fasting for at least 8 hours.   Glucose-Capillary 01/27/2022 220 (H)  70 - 99 mg/dL Final   Glucose reference range applies only to samples taken after fasting for at least 8 hours.   Glucose-Capillary 01/27/2022 154 (H)  70 -  99 mg/dL Final   Glucose reference range applies only to samples taken after fasting for at least 8 hours.   Glucose-Capillary 01/27/2022 163 (H)  70 - 99 mg/dL Final   Glucose reference range applies only to samples taken after fasting for at least 8 hours.   Glucose-Capillary 01/28/2022 127 (H)  70 - 99 mg/dL Final   Glucose reference range applies only to samples taken after fasting for at least 8 hours.   Glucose-Capillary 01/28/2022 110 (H)  70 - 99 mg/dL Final   Glucose reference range applies only to samples taken after fasting for at least 8 hours.   Glucose-Capillary 01/28/2022 167 (H)  70 - 99 mg/dL Final   Glucose reference range applies only to samples taken after fasting for at least 8 hours.   Glucose-Capillary 01/29/2022 145 (H)  70 - 99 mg/dL Final   Glucose reference range applies only to samples taken after fasting for at least 8 hours.   Glucose-Capillary 01/29/2022 203 (H)  70 - 99 mg/dL Final   Glucose reference range applies only to samples taken after fasting for at least 8 hours.   Glucose-Capillary 01/29/2022 153 (H)  70 - 99 mg/dL Final   Glucose reference range applies only to samples taken after fasting for at least 8 hours.   Glucose-Capillary 01/29/2022 166 (H)  70 - 99 mg/dL Final   Glucose reference range applies only to samples taken after fasting for at least 8 hours.   Glucose-Capillary 01/30/2022 142 (H)  70 - 99 mg/dL Final   Glucose  reference range applies only to samples taken after fasting for at least 8 hours.   Glucose-Capillary 01/30/2022 138 (H)  70 - 99 mg/dL Final   Glucose reference range applies only to samples taken after fasting for at least 8 hours.   Glucose-Capillary 01/30/2022 185 (H)  70 - 99 mg/dL Final   Glucose reference range applies only to samples taken after fasting for at least 8 hours.   Glucose-Capillary 01/30/2022 220 (H)  70 - 99 mg/dL Final   Glucose reference range applies only to samples taken after fasting for at least 8 hours.   Glucose-Capillary 01/31/2022 147 (H)  70 - 99 mg/dL Final   Glucose reference range applies only to samples taken after fasting for at least 8 hours.   Glucose-Capillary 01/31/2022 131 (H)  70 - 99 mg/dL Final   Glucose reference range applies only to samples taken after fasting for at least 8 hours.   Glucose-Capillary 01/31/2022 169 (H)  70 - 99 mg/dL Final   Glucose reference range applies only to samples taken after fasting for at least 8 hours.  Admission on 01/23/2022, Discharged on 01/24/2022  Component Date Value Ref Range Status   Sodium 01/23/2022 140  135 - 145 mmol/L Final   Potassium 01/23/2022 4.4  3.5 - 5.1 mmol/L Final   Chloride 01/23/2022 101  98 - 111 mmol/L Final   CO2 01/23/2022 25  22 - 32 mmol/L Final   Glucose, Bld 01/23/2022 198 (H)  70 - 99 mg/dL Final   Glucose reference range applies only to samples taken after fasting for at least 8 hours.   BUN 01/23/2022 13  6 - 20 mg/dL Final   Creatinine, Ser 01/23/2022 0.62  0.44 - 1.00 mg/dL Final   Calcium 01/23/2022 9.3  8.9 - 10.3 mg/dL Final   GFR, Estimated 01/23/2022 >60  >60 mL/min Final   Comment: (NOTE) Calculated using the CKD-EPI Creatinine Equation (2021)  Anion gap 01/23/2022 14  5 - 15 Final   Performed at [facility], [place] [address] [place], [place] [address]   WBC 01/23/2022 5.8  4.0 - 10.5 K/uL Final   RBC 01/23/2022 4.63  3.87 - 5.11 MIL/uL Final   Hemoglobin  01/23/2022 11.7 (L)  12.0 - 15.0 g/dL Final   HCT 01/23/2022 37.4  36.0 - 46.0 % Final   MCV 01/23/2022 80.8  80.0 - 100.0 fL Final   MCH 01/23/2022 25.3 (L)  26.0 - 34.0 pg Final   MCHC 01/23/2022 31.3  30.0 - 36.0 g/dL Final   RDW 01/23/2022 17.9 (H)  11.5 - 15.5 % Final   Platelets 01/23/2022 333  150 - 400 K/uL Final   nRBC 01/23/2022 0.0  0.0 - 0.2 % Final   Performed at [place] [address] [place], [place] [address]   Troponin I (High Sensitivity) 01/23/2022 6  <18 ng/L Final   Comment: (NOTE) Elevated high sensitivity troponin I (hsTnI) values and significant  changes across serial measurements may suggest ACS but many other  chronic and acute conditions are known to elevate hsTnI results.  Refer to the "Links" section for chest pain algorithms and additional  guidance. Performed at [facility], [place] [address], [place], [place] [address]    Troponin I (High Sensitivity) 01/23/2022 5  <18 ng/L Final   Comment: (NOTE) Elevated high sensitivity troponin I (hsTnI) values and significant  changes across serial measurements may suggest ACS but many other  chronic and acute conditions are known to elevate hsTnI results.  Refer to the "Links" section for chest pain algorithms and additional  guidance. Performed at [facility], [place] [address] [place], [place] [address]    SARS Coronavirus 2 by RT PCR 01/23/2022 NEGATIVE  NEGATIVE Final   Comment: (NOTE) SARS-CoV-2 target nucleic acids are NOT DETECTED.  The SARS-CoV-2 RNA is generally detectable in upper and lower respiratory specimens during the acute phase of infection. The lowest concentration of SARS-CoV-2 viral copies this assay can detect is 250 copies / mL. A negative result does not preclude SARS-CoV-2 infection and should not be used as the sole basis for treatment or other patient management decisions.  A negative result may occur with improper specimen collection / handling, submission of  specimen other than nasopharyngeal swab, presence of viral mutation(s) within the areas targeted by this assay, and inadequate number of viral copies (<250 copies / mL). A negative result must be combined with clinical observations, patient history, and epidemiological information.  Fact Sheet for Patients:   https://www.patel.info/  Fact Sheet for Healthcare Providers: https://hall.com/  This test is not yet approved or                           cleared by the [place] FDA and has been authorized for detection and/or diagnosis of SARS-CoV-2 by FDA under an Emergency Use Authorization (EUA).  This EUA will remain in effect (meaning this test can be used) for the duration of the COVID-19 declaration under Section 564(b)(1) of the Act, 21 U.S.C. section 360bbb-3(b)(1), unless the authorization is terminated or revoked sooner.  Performed at [facility], [place] [address] [place], [place] [address]    B Natriuretic Peptide 01/23/2022 41.3  0.0 - 100.0 pg/mL Final   Performed at [place] [address], [place], [place] [address]   Group A Strep  by PCR 01/23/2022 NOT DETECTED  NOT DETECTED Final   Performed at [facility], [place] [address] [place], [place] [address]   Color, Urine 01/23/2022 YELLOW  YELLOW Final   APPearance 01/23/2022 CLEAR  CLEAR Final   Specific Gravity, Urine 01/23/2022 1.018  1.005 - 1.030 Final   pH 01/23/2022 7.0  5.0 - 8.0 Final   Glucose, UA 01/23/2022 NEGATIVE  NEGATIVE mg/dL Final   Hgb urine dipstick 01/23/2022 NEGATIVE  NEGATIVE Final   Bilirubin Urine 01/23/2022 NEGATIVE  NEGATIVE Final   Ketones, ur 01/23/2022 NEGATIVE  NEGATIVE mg/dL Final   Protein, ur 01/23/2022 NEGATIVE  NEGATIVE mg/dL Final   Nitrite 01/23/2022 NEGATIVE  NEGATIVE Final   Leukocytes,Ua 01/23/2022 TRACE (A)  NEGATIVE Final   RBC / HPF 01/23/2022 0-5  0 - 5 RBC/hpf Final   WBC, UA 01/23/2022 0-5  0 - 5 WBC/hpf  Final   Bacteria, UA 01/23/2022 NONE SEEN  NONE SEEN Final   Squamous Epithelial / LPF 01/23/2022 0-5  0 - 5 Final   Mucus 01/23/2022 PRESENT   Final   Performed at [facility], [place] [address], [place], [place] [address]   SARS Coronavirus 2 by RT PCR 01/23/2022 NEGATIVE  NEGATIVE Final   Comment: (NOTE) SARS-CoV-2 target nucleic acids are NOT DETECTED.  The SARS-CoV-2 RNA is generally detectable in upper respiratory specimens during the acute phase of infection. The lowest concentration of SARS-CoV-2 viral copies this assay can detect is 138 copies/mL. A negative result does not preclude SARS-Cov-2 infection and should not be used as the sole basis for treatment or other patient management decisions. A negative result may occur with  improper specimen collection/handling, submission of specimen other than nasopharyngeal swab, presence of viral mutation(s) within the areas targeted by this assay, and inadequate number of viral copies(<138 copies/mL). A negative result must be combined with clinical observations, patient history, and epidemiological information. The expected result is Negative.  Fact Sheet for Patients:  EntrepreneurPulse.com.au  Fact Sheet for Healthcare Providers:  IncredibleEmployment.be  This test is no                          t yet approved or cleared by the [place] FDA and  has been authorized for detection and/or diagnosis of SARS-CoV-2 by FDA under an Emergency Use Authorization (EUA). This EUA will remain  in effect (meaning this test can be used) for the duration of the COVID-19 declaration under Section 564(b)(1) of the Act, 21 U.S.C.section 360bbb-3(b)(1), unless the authorization is terminated  or revoked sooner.       Influenza A by PCR 01/23/2022 NEGATIVE  NEGATIVE Final   Influenza B by PCR 01/23/2022 NEGATIVE  NEGATIVE Final   Comment: (NOTE) The Xpert Xpress SARS-CoV-2/FLU/RSV plus assay is  intended as an aid in the diagnosis of influenza from Nasopharyngeal swab specimens and should not be used as a sole basis for treatment. Nasal washings and aspirates are unacceptable for Xpert Xpress SARS-CoV-2/FLU/RSV testing.  Fact Sheet for Patients: EntrepreneurPulse.com.au  Fact Sheet for Healthcare Providers: IncredibleEmployment.be  This test is not yet approved or cleared by the [place] FDA and has been authorized for detection and/or diagnosis of SARS-CoV-2 by FDA under an Emergency Use Authorization (EUA). This EUA will remain in effect (meaning this test can be used) for the duration of the COVID-19 declaration under Section 564(b)(1) of the Act, 21 U.S.C. section 360bbb-3(b)(1), unless the authorization is terminated or  revoked.  Performed at [facility], [place] [address] [place], [place] [address]    WBC 01/24/2022 6.1  4.0 - 10.5 K/uL Final   RBC 01/24/2022 4.60  3.87 - 5.11 MIL/uL Final   Hemoglobin 01/24/2022 11.7 (L)  12.0 - 15.0 g/dL Final   HCT 01/24/2022 37.0  36.0 - 46.0 % Final   MCV 01/24/2022 80.4  80.0 - 100.0 fL Final   MCH 01/24/2022 25.4 (L)  26.0 - 34.0 pg Final   MCHC 01/24/2022 31.6  30.0 - 36.0 g/dL Final   RDW 01/24/2022 17.6 (H)  11.5 - 15.5 % Final   Platelets 01/24/2022 334  150 - 400 K/uL Final   nRBC 01/24/2022 0.0  0.0 - 0.2 % Final   Neutrophils Relative % 01/24/2022 53  % Final   Neutro Abs 01/24/2022 3.3  1.7 - 7.7 K/uL Final   Lymphocytes Relative 01/24/2022 33  % Final   Lymphs Abs 01/24/2022 2.0  0.7 - 4.0 K/uL Final   Monocytes Relative 01/24/2022 11  % Final   Monocytes Absolute 01/24/2022 0.7  0.1 - 1.0 K/uL Final   Eosinophils Relative 01/24/2022 2  % Final   Eosinophils Absolute 01/24/2022 0.1  0.0 - 0.5 K/uL Final   Basophils Relative 01/24/2022 1  % Final   Basophils Absolute 01/24/2022 0.1  0.0 - 0.1 K/uL Final   Immature Granulocytes 01/24/2022 0  % Final   Abs Immature  Granulocytes 01/24/2022 0.02  0.00 - 0.07 K/uL Final   Performed at [facility], [place] [address], [place], [place] [address]   Glucose-Capillary 01/24/2022 110 (H)  70 - 99 mg/dL Final   Glucose reference range applies only to samples taken after fasting for at least 8 hours.  No results displayed because visit has over 200 results.    Admission on 11/22/2021, Discharged on 11/22/2021  Component Date Value Ref Range Status   Glucose-Capillary 11/22/2021 169 (H)  70 - 99 mg/dL Final   Glucose reference range applies only to samples taken after fasting for at least 8 hours.  No results displayed because visit has over 200 results.    Admission on 10/04/2021, Discharged on 10/22/2021  Component Date Value Ref Range Status   SARS Coronavirus 2 by RT PCR 10/04/2021 NEGATIVE  NEGATIVE Final   Comment: (NOTE) SARS-CoV-2 target nucleic acids are NOT DETECTED.  The SARS-CoV-2 RNA is generally detectable in upper respiratory specimens during the acute phase of infection. The lowest concentration of SARS-CoV-2 viral copies this assay can detect is 138 copies/mL. A negative result does not preclude SARS-Cov-2 infection and should not be used as the sole basis for treatment or other patient management decisions. A negative result may occur with  improper specimen collection/handling, submission of specimen other than nasopharyngeal swab, presence of viral mutation(s) within the areas targeted by this assay, and inadequate number of viral copies(<138 copies/mL). A negative result must be combined with clinical observations, patient history, and epidemiological information. The expected result is Negative.  Fact Sheet for Patients:  EntrepreneurPulse.com.au  Fact Sheet for Healthcare Providers:  IncredibleEmployment.be  This test is no                          t yet approved or cleared by the [place] FDA and  has been authorized for detection  and/or diagnosis of SARS-CoV-2 by FDA under an Emergency Use Authorization (EUA). This EUA will remain  in effect (meaning this  test can be used) for the duration of the COVID-19 declaration under Section 564(b)(1) of the Act, 21 U.S.C.section 360bbb-3(b)(1), unless the authorization is terminated  or revoked sooner.       Influenza A by PCR 10/04/2021 NEGATIVE  NEGATIVE Final   Influenza B by PCR 10/04/2021 NEGATIVE  NEGATIVE Final   Comment: (NOTE) The Xpert Xpress SARS-CoV-2/FLU/RSV plus assay is intended as an aid in the diagnosis of influenza from Nasopharyngeal swab specimens and should not be used as a sole basis for treatment. Nasal washings and aspirates are unacceptable for Xpert Xpress SARS-CoV-2/FLU/RSV testing.  Fact Sheet for Patients: EntrepreneurPulse.com.au  Fact Sheet for Healthcare Providers: IncredibleEmployment.be  This test is not yet approved or cleared by the [place] FDA and has been authorized for detection and/or diagnosis of SARS-CoV-2 by FDA under an Emergency Use Authorization (EUA). This EUA will remain in effect (meaning this test can be used) for the duration of the COVID-19 declaration under Section 564(b)(1) of the Act, 21 U.S.C. section 360bbb-3(b)(1), unless the authorization is terminated or revoked.  Performed at [facility], [place] [address], [place], [place] [address]    WBC 10/04/2021 8.4  4.0 - 10.5 K/uL Final   RBC 10/04/2021 4.42  3.87 - 5.11 MIL/uL Final   Hemoglobin 10/04/2021 11.6 (L)  12.0 - 15.0 g/dL Final   HCT 10/04/2021 35.7 (L)  36.0 - 46.0 % Final   MCV 10/04/2021 80.8  80.0 - 100.0 fL Final   MCH 10/04/2021 26.2  26.0 - 34.0 pg Final   MCHC 10/04/2021 32.5  30.0 - 36.0 g/dL Final   RDW 10/04/2021 16.0 (H)  11.5 - 15.5 % Final   Platelets 10/04/2021 372  150 - 400 K/uL Final   nRBC 10/04/2021 0.0  0.0 - 0.2 % Final   Neutrophils Relative % 10/04/2021 68  % Final   Neutro  Abs 10/04/2021 5.7  1.7 - 7.7 K/uL Final   Lymphocytes Relative 10/04/2021 22  % Final   Lymphs Abs 10/04/2021 1.8  0.7 - 4.0 K/uL Final   Monocytes Relative 10/04/2021 8  % Final   Monocytes Absolute 10/04/2021 0.7  0.1 - 1.0 K/uL Final   Eosinophils Relative 10/04/2021 1  % Final   Eosinophils Absolute 10/04/2021 0.1  0.0 - 0.5 K/uL Final   Basophils Relative 10/04/2021 1  % Final   Basophils Absolute 10/04/2021 0.1  0.0 - 0.1 K/uL Final   Immature Granulocytes 10/04/2021 0  % Final   Abs Immature Granulocytes 10/04/2021 0.03  0.00 - 0.07 K/uL Final   Performed at [facility], [place] [address] [place], [place] [address]   Sodium 10/04/2021 136  135 - 145 mmol/L Final   Potassium 10/04/2021 4.2  3.5 - 5.1 mmol/L Final   Chloride 10/04/2021 104  98 - 111 mmol/L Final   CO2 10/04/2021 25  22 - 32 mmol/L Final   Glucose, Bld 10/04/2021 100 (H)  70 - 99 mg/dL Final   Glucose reference range applies only to samples taken after fasting for at least 8 hours.   BUN 10/04/2021 9  6 - 20 mg/dL Final   Creatinine, Ser 10/04/2021 0.52  0.44 - 1.00 mg/dL Final   Calcium 10/04/2021 9.0  8.9 - 10.3 mg/dL Final   Total Protein 10/04/2021 7.0  6.5 - 8.1 g/dL Final   Albumin 10/04/2021 3.2 (L)  3.5 - 5.0 g/dL Final   AST 10/04/2021 13 (L)  15 - 41 U/L Final  ALT 10/04/2021 10  0 - 44 U/L Final   Alkaline Phosphatase 10/04/2021 61  38 - 126 U/L Final   Total Bilirubin 10/04/2021 0.3  0.3 - 1.2 mg/dL Final   GFR, Estimated 10/04/2021 >60  >60 mL/min Final   Comment: (NOTE) Calculated using the CKD-EPI Creatinine Equation (2021)    Anion gap 10/04/2021 7  5 - 15 Final   Performed at [facility], [place] [address], [place], [place] [address]   Hgb A1c MFr Bld 10/04/2021 7.0 (H)  4.8 - 5.6 % Final   Comment: (NOTE) Pre diabetes:          5.7%-6.4%  Diabetes:              >6.4%  Glycemic control for   <7.0% adults with diabetes    Mean Plasma Glucose 10/04/2021 154.2  mg/dL Final    Performed at [facility], [place] [address], [place], [place] [address]   Cholesterol 10/04/2021 178  0 - 200 mg/dL Final   Triglycerides 10/04/2021 155 (H)  <150 mg/dL Final   HDL 10/04/2021 52  >40 mg/dL Final   Total CHOL/HDL Ratio 10/04/2021 3.4  RATIO Final   VLDL 10/04/2021 31  0 - 40 mg/dL Final   LDL Cholesterol 10/04/2021 95  0 - 99 mg/dL Final   Comment:        Total Cholesterol/HDL:CHD Risk Coronary Heart Disease Risk Table                     Men   Women  1/2 Average Risk   3.4   3.3  Average Risk       5.0   4.4  2 X Average Risk   9.6   7.1  3 X Average Risk  23.4   11.0        Use the calculated Patient Ratio above and the CHD Risk Table to determine the patient's CHD Risk.        ATP III CLASSIFICATION (LDL):  <100     mg/dL   Optimal  100-129  mg/dL   Near or Above                    Optimal  130-159  mg/dL   Borderline  160-189  mg/dL   High  >190     mg/dL   Very High Performed at [facility] [address] [place], [place] [address]    POC Amphetamine UR 10/04/2021 None Detected  NONE DETECTED (Cut Off Level 1000 ng/mL) Final   POC Secobarbital (BAR) 10/04/2021 None Detected  NONE DETECTED (Cut Off Level 300 ng/mL) Final   POC Buprenorphine (BUP) 10/04/2021 None Detected  NONE DETECTED (Cut Off Level 10 ng/mL) Final   POC Oxazepam (BZO) 10/04/2021 None Detected  NONE DETECTED (Cut Off Level 300 ng/mL) Final   POC Cocaine UR 10/04/2021 None Detected  NONE DETECTED (Cut Off Level 300 ng/mL) Final   POC Methamphetamine UR 10/04/2021 None Detected  NONE DETECTED (Cut Off Level 1000 ng/mL) Final   POC Morphine 10/04/2021 None Detected  NONE DETECTED (Cut Off Level 300 ng/mL) Final   POC Methadone UR 10/04/2021 None Detected  NONE DETECTED (Cut Off Level 300 ng/mL) Final   POC Oxycodone UR 10/04/2021 Positive (A)  NONE DETECTED (Cut Off Level 100 ng/mL) Final   POC Marijuana UR 10/04/2021 None Detected  NONE DETECTED (Cut Off Level 50 ng/mL) Final    SARSCOV2ONAVIRUS  2 AG 10/04/2021 NEGATIVE  NEGATIVE Final   Comment: (NOTE) SARS-CoV-2 antigen NOT DETECTED.   Negative results are presumptive.  Negative results do not preclude SARS-CoV-2 infection and should not be used as the sole basis for treatment or other patient management decisions, including infection  control decisions, particularly in the presence of clinical signs and  symptoms consistent with COVID-19, or in those who have been in contact with the virus.  Negative results must be combined with clinical observations, patient history, and epidemiological information. The expected result is Negative.  Fact Sheet for Patients: HandmadeRecipes.com.cy  Fact Sheet for Healthcare Providers: FuneralLife.at  This test is not yet approved or cleared by the [place] FDA and  has been authorized for detection and/or diagnosis of SARS-CoV-2 by FDA under an Emergency Use Authorization (EUA).  This EUA will remain in effect (meaning this test can be used) for the duration of  the COV                          ID-19 declaration under Section 564(b)(1) of the Act, 21 U.S.C. section 360bbb-3(b)(1), unless the authorization is terminated or revoked sooner.     Valproic Acid Lvl 10/04/2021 51  50.0 - 100.0 ug/mL Final   Performed at [facility] [address], [place], [place] [address]   Valproic Acid Lvl 10/08/2021 57  50.0 - 100.0 ug/mL Final   Performed at [facility] [address] [place], [place] [address]   Glucose-Capillary 10/09/2021 123 (H)  70 - 99 mg/dL Final   Glucose reference range applies only to samples taken after fasting for at least 8 hours.  Admission on 09/09/2021, Discharged on 09/10/2021  Component Date Value Ref Range Status   Sodium 09/09/2021 134 (L)  135 - 145 mmol/L Final   Potassium 09/09/2021 4.3  3.5 - 5.1 mmol/L Final   Chloride 09/09/2021 99  98 - 111 mmol/L Final   CO2 09/09/2021 25   22 - 32 mmol/L Final   Glucose, Bld 09/09/2021 107 (H)  70 - 99 mg/dL Final   Glucose reference range applies only to samples taken after fasting for at least 8 hours.   BUN 09/09/2021 12  6 - 20 mg/dL Final   Creatinine, Ser 09/09/2021 0.74  0.44 - 1.00 mg/dL Final   Calcium 09/09/2021 9.0  8.9 - 10.3 mg/dL Final   Total Protein 09/09/2021 7.4  6.5 - 8.1 g/dL Final   Albumin 09/09/2021 3.1 (L)  3.5 - 5.0 g/dL Final   AST 09/09/2021 13 (L)  15 - 41 U/L Final   ALT 09/09/2021 13  0 - 44 U/L Final   Alkaline Phosphatase 09/09/2021 66  38 - 126 U/L Final   Total Bilirubin 09/09/2021 0.2 (L)  0.3 - 1.2 mg/dL Final   GFR, Estimated 09/09/2021 >60  >60 mL/min Final   Comment: (NOTE) Calculated using the CKD-EPI Creatinine Equation (2021)    Anion gap 09/09/2021 10  5 - 15 Final   Performed at [facility], [place] [address], [place], [place] [address]   WBC 09/09/2021 8.5  4.0 - 10.5 K/uL Final   RBC 09/09/2021 4.53  3.87 - 5.11 MIL/uL Final   Hemoglobin 09/09/2021 11.8 (L)  12.0 - 15.0 g/dL Final   HCT 09/09/2021 38.0  36.0 - 46.0 % Final   MCV 09/09/2021 83.9  80.0 - 100.0 fL Final   MCH 09/09/2021 26.0  26.0 - 34.0  pg Final   MCHC 09/09/2021 31.1  30.0 - 36.0 g/dL Final   RDW 09/09/2021 17.8 (H)  11.5 - 15.5 % Final   Platelets 09/09/2021 442 (H)  150 - 400 K/uL Final   nRBC 09/09/2021 0.0  0.0 - 0.2 % Final   Neutrophils Relative % 09/09/2021 70  % Final   Neutro Abs 09/09/2021 5.9  1.7 - 7.7 K/uL Final   Lymphocytes Relative 09/09/2021 20  % Final   Lymphs Abs 09/09/2021 1.7  0.7 - 4.0 K/uL Final   Monocytes Relative 09/09/2021 8  % Final   Monocytes Absolute 09/09/2021 0.7  0.1 - 1.0 K/uL Final   Eosinophils Relative 09/09/2021 1  % Final   Eosinophils Absolute 09/09/2021 0.1  0.0 - 0.5 K/uL Final   Basophils Relative 09/09/2021 1  % Final   Basophils Absolute 09/09/2021 0.1  0.0 - 0.1 K/uL Final   Immature Granulocytes 09/09/2021 0  % Final   Abs Immature Granulocytes  09/09/2021 0.03  0.00 - 0.07 K/uL Final   Performed at [facility], [place] [address] [place], [place] [address]   Ammonia 09/09/2021 37 (H)  9 - 35 umol/L Final   Comment: HEMOLYSIS AT THIS LEVEL MAY AFFECT RESULT Performed at [facility], [place] [address] [place], [place] [address]    Opiates 09/09/2021 NONE DETECTED  NONE DETECTED Final   Cocaine 09/09/2021 NONE DETECTED  NONE DETECTED Final   Benzodiazepines 09/09/2021 NONE DETECTED  NONE DETECTED Final   Amphetamines 09/09/2021 NONE DETECTED  NONE DETECTED Final   Tetrahydrocannabinol 09/09/2021 NONE DETECTED  NONE DETECTED Final   Barbiturates 09/09/2021 NONE DETECTED  NONE DETECTED Final   Comment: (NOTE) DRUG SCREEN FOR MEDICAL PURPOSES ONLY.  IF CONFIRMATION IS NEEDED FOR ANY PURPOSE, NOTIFY LAB WITHIN 5 DAYS.  LOWEST DETECTABLE LIMITS FOR URINE DRUG SCREEN Drug Class                     Cutoff (ng/mL) Amphetamine and metabolites    1000 Barbiturate and metabolites    200 Benzodiazepine                 704 Tricyclics and metabolites     300 Opiates and metabolites        300 Cocaine and metabolites        300 THC                            50 Performed at [facility], [place] [address] [place], [place] [address]    Alcohol, Ethyl (B) 09/09/2021 <10  <10 mg/dL Final   Comment: (NOTE) Lowest detectable limit for serum alcohol is 10 mg/dL.  For medical purposes only. Performed at [facility], [place] [address] [place], [place] [address]    Lipase 09/09/2021 33  11 - 51 U/L Final   Performed at [facility] [address] [place], [place] [address]   Color, Urine 09/09/2021 COLORLESS (A)  YELLOW Final   APPearance 09/09/2021 CLEAR  CLEAR Final   Specific Gravity, Urine 09/09/2021 1.002 (L)  1.005 - 1.030 Final   pH 09/09/2021 6.0  5.0 - 8.0 Final   Glucose, UA 09/09/2021 NEGATIVE  NEGATIVE mg/dL Final   Hgb urine dipstick 09/09/2021 NEGATIVE  NEGATIVE Final   Bilirubin Urine 09/09/2021  NEGATIVE  NEGATIVE Final   Ketones, ur 09/09/2021 NEGATIVE  NEGATIVE mg/dL Final  Protein, ur 09/09/2021 NEGATIVE  NEGATIVE mg/dL Final   Nitrite 09/09/2021 NEGATIVE  NEGATIVE Final   Leukocytes,Ua 09/09/2021 NEGATIVE  NEGATIVE Final   Performed at [facility], [place] [address] [place], [place] [address]   Specimen Description 09/09/2021 URINE, CLEAN CATCH   Final   Special Requests 09/09/2021 NONE   Final   Culture 09/09/2021  (A)   Final                   Value:<10,000 COLONIES/mL INSIGNIFICANT GROWTH Performed at [facility], [place] [address], [place], [place] [address]    Report Status 09/09/2021 09/10/2021 FINAL   Final   D-Dimer, Quant 09/09/2021 0.92 (H)  0.00 - 0.50 ug/mL-FEU Final   Comment: (NOTE) At the manufacturer cut-off value of 0.5 g/mL FEU, this assay has a negative predictive value of 95-100%.This assay is intended for use in conjunction with a clinical pretest probability (PTP) assessment model to exclude pulmonary embolism (PE) and deep venous thrombosis (DVT) in outpatients suspected of PE or DVT. Results should be correlated with clinical presentation. Performed at [facility], [place] [address] [place], [place] [address]    Troponin I (High Sensitivity) 09/09/2021 5  <18 ng/L Final   Comment: (NOTE) Elevated high sensitivity troponin I (hsTnI) values and significant  changes across serial measurements may suggest ACS but many other  chronic and acute conditions are known to elevate hsTnI results.  Refer to the "Links" section for chest pain algorithms and additional  guidance. Performed at [facility], [place] [address] [place], [place] [address]    BP 09/10/2021 135/83  mmHg Final   S' Lateral 09/10/2021 2.30  cm Final   Area-P 1/2 09/10/2021 5.58  cm2 Final    Blood Alcohol level:  Lab Results  Component Value Date   ETH <10 11/23/2021   ETH <10 64/33/2951    Metabolic Disorder Labs: Lab Results  Component Value Date    HGBA1C 6.7 (H) 12/28/2021   MPG 145.59 12/28/2021   MPG 154.2 10/04/2021   Lab Results  Component Value Date   PROLACTIN 3.8 (L) 11/23/2021   Lab Results  Component Value Date   CHOL 171 11/23/2021   TRIG 125 11/23/2021   HDL 65 11/23/2021   CHOLHDL 2.6 11/23/2021   VLDL 25 11/23/2021   LDLCALC 81 11/23/2021   LDLCALC 95 10/04/2021    Therapeutic Lab Levels: No results found for: "LITHIUM" Lab Results  Component Value Date   VALPROATE 33 (L) 01/18/2022   VALPROATE 33 (L) 12/28/2021   No results found for: "CBMZ"  Physical Findings   PHQ2-9    Flowsheet Row ED [facility]23/2021 in [facility]  PHQ-2 Total Score 2  PHQ-9 Total Score 3      Flowsheet Row ED from 01/23/2022 in [facility] from 11/23/2021 in [facility] ED from 11/22/2021 in [facility] DEPT  C-SSRS RISK CATEGORY No Risk No Risk No Risk        Musculoskeletal  Strength & Muscle Tone: within normal limits Gait & Station: normal Patient leans: N/A  Psychiatric Specialty Exam  Presentation  General Appearance:  Appropriate for Environment; Casual  Eye Contact: Good  Speech: Clear and Coherent; Normal Rate  Speech Volume: Normal  Handedness: Right   Mood and Affect  Mood: Depressed  Affect: Congruent   Thought Process  Thought Processes: Coherent  Descriptions of  Associations:Intact  Orientation:Full (Time, Place and Person)  Thought Content:Logical  Diagnosis of Schizophrenia or Schizoaffective disorder in past: No  Duration of Psychotic Symptoms: No data recorded  Hallucinations:Hallucinations: None  Ideas of Reference:None  Suicidal Thoughts:Suicidal Thoughts: No  Homicidal Thoughts:Homicidal Thoughts: No   Sensorium  Memory: Immediate Good; Recent Good; Remote Good  Judgment: Fair  Insight: Fair   Community education officer   Concentration: Good  Attention Span: Good  Recall: Good  Fund of Knowledge: Good  Language: Good   Psychomotor Activity  Psychomotor Activity: Psychomotor Activity: Normal   Assets  Assets: Communication Skills; Desire for Improvement; Physical Health; Resilience; Social Support   Sleep  Sleep: Sleep: Good   No data recorded  Physical Exam  Physical Exam Vitals and nursing note reviewed.  Constitutional:      General: She is not in acute distress.    Appearance: Normal appearance. She is not ill-appearing.  HENT:     Head: Normocephalic.  Eyes:     General:        Right eye: No discharge.        Left eye: No discharge.     Conjunctiva/sclera: Conjunctivae normal.  Cardiovascular:     Rate and Rhythm: Normal rate.  Pulmonary:     Effort: Pulmonary effort is normal.  Musculoskeletal:        General: Normal range of motion.     Cervical back: Normal range of motion.  Skin:    Coloration: Skin is not jaundiced or pale.  Neurological:     Mental Status: She is alert and oriented to person, place, and time.  Psychiatric:        Attention and Perception: Attention and perception normal.        Mood and Affect: Affect normal. Mood is depressed.        Speech: Speech normal.        Behavior: Behavior normal. Behavior is cooperative.        Thought Content: Thought content normal.        Cognition and Memory: Cognition normal.        Judgment: Judgment normal.    Review of Systems  Constitutional: Negative.   HENT: Negative.    Eyes: Negative.   Respiratory: Negative.    Cardiovascular: Negative.   Musculoskeletal: Negative.   Skin: Negative.   Neurological: Negative.   Psychiatric/Behavioral:  Positive for depression.    Blood pressure 131/67, pulse 73, temperature 98 F (36.7 C), temperature source Oral, resp. rate 16, SpO2 96 %. There is no height or weight on file to calculate BMI.  Treatment Plan Summary:  Patient remains cleared by  psychiatry. TOC team seeking disposition plan.  Will continue to have daily contact with patient to assess and evaluate symptoms and progress in treatment   Revonda Humphrey, NP 01/31/2022 4:33 PM

## 2022-01-31 NOTE — Progress Notes (Signed)
Pt presents with depressed mood, flat affect. Remains at [facility] boarding further facility for housing. She denies any SI HI or AV Hallucinations and states '' I'm just tired of sleeping on this cot, my back is tired. '' Pt CBG fasting this am 147. Pt continues to constantly ask for food and snacks despite education about glucose.  Pt compliant with am medications. Pt is safe, able to make her needs known. Will con't to monitor.

## 2022-01-31 NOTE — ED Notes (Signed)
Pt sleeping@this time. Breathing even and unlabored. Will continue to monitor for safety 

## 2022-01-31 NOTE — ED Notes (Signed)
Patient alert and oriented x 4. Calm and cooperative on unit resting in bed watching television and interacting with a peer. Respirations equal and unlabored, skin warm and dry, NAD. Routine safety checks conducted according to facility protocol. Will continue to monitor for safety.

## 2022-02-01 DIAGNOSIS — F209 Schizophrenia, unspecified: Secondary | ICD-10-CM | POA: Diagnosis not present

## 2022-02-01 LAB — GLUCOSE, CAPILLARY
Glucose-Capillary: 117 mg/dL — ABNORMAL HIGH (ref 70–99)
Glucose-Capillary: 180 mg/dL — ABNORMAL HIGH (ref 70–99)
Glucose-Capillary: 209 mg/dL — ABNORMAL HIGH (ref 70–99)

## 2022-02-01 MED ORDER — INSULIN GLARGINE-YFGN 100 UNIT/ML ~~LOC~~ SOLN
12.0000 [IU] | Freq: Every day | SUBCUTANEOUS | Status: DC
  Administered 2022-02-02 – 2022-02-08 (×7): 12 [IU] via SUBCUTANEOUS

## 2022-02-01 NOTE — Progress Notes (Addendum)
LCSW Progress Note   LCSW was contacted by Harless Nakayama, owner and director of [employer] [address], to schedule a virtual meeting to meet the pt and do a short interview with her.  The virtual meeting will be set for 03 February 2022 @ 1100.  Informed pt of Wednesay's meeting; pt appeared to be pleased and eager.    1613 - LCSW contacted by Denton Lank, [phone] @ [facility] regarding pt's preference for location and inquire about major illnesses that need to be medically monitored.  Information was provided.   Hansel Starling, MSW, LCSW [facility] [phone] phone [phone] fax

## 2022-02-01 NOTE — ED Notes (Signed)
Pt resting quietly, breathing is even and unlabored.  Pt denies SI, HI, pain and AVH. Breakfast has been provided. Will continue to monitor for safety.

## 2022-02-01 NOTE — ED Provider Notes (Signed)
Behavioral Health Progress Note  Date and Time: 02/01/2022 10:02 AM Name: Teresa Murillo MRN:  [id number]  Subjective:  Teresa Murillo 61 y.o., female patient presented Teresa Murillo 61 y.o., female patient with a past psychiatric history of schizophrenia, aggressive behavior, and borderline intellectual disability who initially presented to [facility] on 11/23/2021 via [facility].  At that time patient had been dismissed from the group home due to multiple elopements.  Patient has remained at the [facility] while awaiting new placement.  Social work and DSS are involved    Teresa Murillo, 62 y.o., female patient seen face to face by this provider and chart reviewed on 02/01/22.    During evaluation [name] is observed laying in her bed awake.  She is pleasant upon approach and talkative.  She is alert/oriented x 4, calm, cooperative and attentive.  She continues to endorse some depression but states, "I am okay".  She has a euthymic affect.  She denies any concerns with appetite or sleep.  States she is excited because she has a birthday coming up on the 14th and she is excited about Christmas. She talks about her favorite flavors of cake.  She denies SI/HI/AVH.  She is able to converse coherently with no distractibility or internal preoccupation.  She continues to remain psychiatrically cleared.  DSS and TOC continues to seek placement.     Diagnosis:  Final diagnoses:  Schizophrenia, unspecified type ([place])  At risk for self care deficit  Noncompliance  Type 2 diabetes mellitus without complication, unspecified whether long term insulin use ([place])    Total Time spent with patient: 30 minutes  Past Psychiatric History: see H&P Past Medical History:  Past Medical History:  Diagnosis Date   Borderline intellectual functioning 12/14/2021   On 12/14/2021: Appreciate assistance from psychology consult. On the Wechsler Adult Intelligence Scale-4, Ms. Lebeda achieved a full-scale IQ score of  73 and a percentile rank of 4 placing her in the borderline range of intellectual functioning.    Chronic obstructive pulmonary disease (COPD) (HCC)    Glaucoma    Hyperlipidemia    Hypertension    Iron deficiency    Schizoaffective disorder (HCC)    Type 2 diabetes mellitus (HCC)     Past Surgical History:  Procedure Laterality Date   TUBAL LIGATION     Family History:  Family History  Problem Relation Age of Onset   Breast cancer Maternal Grandmother    Family Psychiatric  History: see H&P Social History:  Social History   Substance and Sexual Activity  Alcohol Use Yes     Social History   Substance and Sexual Activity  Drug Use Not Currently    Social History   Socioeconomic History   Marital status: Divorced    Spouse name: Not on file   Number of children: Not on file   Years of education: Not on file   Highest education level: Not on file  Occupational History   Not on file  Tobacco Use   Smoking status: Every Day    Packs/day: 1.00    Types: Cigars, Cigarettes   Smokeless tobacco: Current  Vaping Use   Vaping Use: Never used  Substance and Sexual Activity   Alcohol use: Yes   Drug use: Not Currently   Sexual activity: Not Currently    Birth control/protection: Surgical  Other Topics Concern   Not on file  Social History Narrative   Not on file   Social Determinants of Health   Financial  Resource Strain: Not on file  Food Insecurity: Not on file  Transportation Needs: Not on file  Physical Activity: Not on file  Stress: Not on file  Social Connections: Not on file   SDOH:  SDOH Screenings   Depression (PHQ2-9): Low Risk  (11/23/2021)  Tobacco Use: High Risk (01/26/2022)   Additional Social History:                         Sleep: Good  Appetite:  Good  Current Medications:  Current Facility-Administered Medications  Medication Dose Route Frequency Provider Last Rate Last Admin   acetaminophen (TYLENOL) tablet 650 mg  650  mg Oral Q6H PRN Revonda Humphrey, NP   650 mg at 01/31/22 2326   albuterol (VENTOLIN HFA) 108 (90 Base) MCG/ACT inhaler 2 puff  2 puff Inhalation Q6H PRN Revonda Humphrey, NP       alum & mag hydroxide-simeth (MAALOX/MYLANTA) 200-200-20 MG/5ML suspension 30 mL  30 mL Oral Q4H PRN Revonda Humphrey, NP       apixaban Arne Cleveland) tablet 5 mg  5 mg Oral BID Revonda Humphrey, NP   5 mg at 02/01/22 0918   ARIPiprazole (ABILIFY) tablet 10 mg  10 mg Oral Daily Revonda Humphrey, NP   10 mg at 02/01/22 0916   cloZAPine (CLOZARIL) tablet 50 mg  50 mg Oral Daily Revonda Humphrey, NP   50 mg at 02/01/22 0917   cloZAPine (CLOZARIL) tablet 75 mg  75 mg Oral QHS Revonda Humphrey, NP   75 mg at 01/31/22 2121   diltiazem (CARDIZEM CD) 24 hr capsule 240 mg  240 mg Oral Daily Revonda Humphrey, NP   240 mg at 02/01/22 0917   divalproex (DEPAKOTE ER) 24 hr tablet 500 mg  500 mg Oral BID Revonda Humphrey, NP   500 mg at 02/01/22 0917   fluticasone furoate-vilanterol (BREO ELLIPTA) 200-25 MCG/ACT 1 puff  1 puff Inhalation Daily Revonda Humphrey, NP   1 puff at 02/01/22 0916   haloperidol (HALDOL) tablet 10 mg  10 mg Oral TID PRN Revonda Humphrey, NP       hydrOXYzine (ATARAX) tablet 25 mg  25 mg Oral TID PRN Revonda Humphrey, NP   25 mg at 01/27/22 0156   insulin aspart (novoLOG) injection 0-9 Units  0-9 Units Subcutaneous TID WC Revonda Humphrey, NP   2 Units at 01/31/22 1640   insulin glargine-yfgn (SEMGLEE) injection 8 Units  8 Units Subcutaneous Q1200 Revonda Humphrey, NP   8 Units at 01/31/22 1202   latanoprost (XALATAN) 0.005 % ophthalmic solution 1 drop  1 drop Both Eyes QHS Thomes Lolling H, NP   1 drop at 01/31/22 2123   magnesium hydroxide (MILK OF MAGNESIA) suspension 30 mL  30 mL Oral Daily PRN Revonda Humphrey, NP       menthol-cetylpyridinium (CEPACOL) lozenge 3 mg  1 lozenge Oral TID PRN Onuoha, Chinwendu V, NP   3 mg at 02/01/22 0001   metFORMIN (GLUCOPHAGE-XR) 24 hr tablet  1,000 mg  1,000 mg Oral BID WC Revonda Humphrey, NP   1,000 mg at 02/01/22 0917   nicotine (NICODERM CQ - dosed in mg/24 hr) patch 7 mg  7 mg Transdermal Daily Revonda Humphrey, NP   7 mg at 02/01/22 0918   traZODone (DESYREL) tablet 50 mg  50 mg Oral QHS PRN Revonda Humphrey, NP   50 mg at  01/27/22 0156   valbenazine (INGREZZA) capsule 40 mg  40 mg Oral q AM Revonda Humphrey, NP   40 mg at 02/01/22 8341   Vitamin D (Ergocalciferol) (DRISDOL) 1.25 MG (50000 UNIT) capsule 50,000 Units  50,000 Units Oral Q Sharren Bridge, NP   50,000 Units at 01/28/22 9622   Current Outpatient Medications  Medication Sig Dispense Refill   Accu-Chek Softclix Lancets lancets Use as directed up to four times daily 100 each 0   apixaban (ELIQUIS) 5 MG TABS tablet Take 1 tablet (5 mg total) by mouth 2 (two) times daily. 60 tablet 0   ARIPiprazole (ABILIFY) 10 MG tablet Take 1 tablet (10 mg total) by mouth daily. 30 tablet 0   Blood Glucose Monitoring Suppl (ACCU-CHEK GUIDE) w/Device KIT Use as directed up to four times daily 1 kit 0   budesonide-formoterol (SYMBICORT) 160-4.5 MCG/ACT inhaler Inhale 2 puffs into the lungs in the morning and at bedtime.     Cholecalciferol (VITAMIN D3) 1.25 MG (50000 UT) CAPS Take 50,000 Units by mouth every Thursday.     cloZAPine (CLOZARIL) 25 MG tablet Take 3 tablets (75 mg total) by mouth at bedtime. 90 tablet 0   clozapine (CLOZARIL) 50 MG tablet Take 1 tablet (50 mg total) by mouth daily. 30 tablet 0   diltiazem (CARDIZEM CD) 240 MG 24 hr capsule Take 1 capsule (240 mg total) by mouth daily. (Patient not taking: Reported on 11/24/2021) 30 capsule 0   divalproex (DEPAKOTE ER) 500 MG 24 hr tablet Take 1 tablet (500 mg total) by mouth 2 (two) times daily. 60 tablet 0   glucose blood test strip Use as directed up to four times daily 50 each 0   haloperidol (HALDOL) 10 MG tablet Take 1 tablet (10 mg total) by mouth 3 (three) times daily as needed for agitation (and  psychotic symptoms).     INGREZZA 40 MG capsule Take 1 capsule (40 mg total) by mouth in the morning. 30 capsule 0   insulin aspart (NOVOLOG) 100 UNIT/ML FlexPen Before each meal 3 times a day, 140-199 - 2 units, 200-250 - 4 units, 251-299 - 6 units,  300-349 - 8 units,  350 or above 10 units. Insulin PEN if approved, provide syringes and needles if needed.Please switch to any approved short acting Insulin if needed. 15 mL 0   insulin glargine (LANTUS) 100 UNIT/ML Solostar Pen Inject 12 Units into the skin daily. 15 mL 0   Insulin Pen Needle 32G X 4 MM MISC Use 4 times a day with insulin, 1 month supply. 100 each 0   latanoprost (XALATAN) 0.005 % ophthalmic solution Place 1 drop into both eyes at bedtime.     metFORMIN (GLUCOPHAGE) 500 MG tablet Take 500 mg by mouth 2 (two) times daily with a meal.     nicotine (NICODERM CQ - DOSED IN MG/24 HR) 7 mg/24hr patch Place 1 patch (7 mg total) onto the skin daily. 28 patch 0   PROAIR HFA 108 (90 Base) MCG/ACT inhaler Inhale 2 puffs into the lungs every 6 (six) hours as needed for wheezing or shortness of breath.      Labs  Lab Results:  Admission on 01/24/2022  Component Date Value Ref Range Status   Glucose-Capillary 01/24/2022 143 (H)  70 - 99 mg/dL Final   Glucose reference range applies only to samples taken after fasting for at least 8 hours.   Glucose-Capillary 01/24/2022 120 (H)  70 - 99 mg/dL Final  Glucose reference range applies only to samples taken after fasting for at least 8 hours.   Glucose-Capillary 01/25/2022 122 (H)  70 - 99 mg/dL Final   Glucose reference range applies only to samples taken after fasting for at least 8 hours.   Glucose-Capillary 01/25/2022 190 (H)  70 - 99 mg/dL Final   Glucose reference range applies only to samples taken after fasting for at least 8 hours.   Glucose-Capillary 01/25/2022 163 (H)  70 - 99 mg/dL Final   Glucose reference range applies only to samples taken after fasting for at least 8 hours.    WBC 01/26/2022 6.5  4.0 - 10.5 K/uL Final   RBC 01/26/2022 4.53  3.87 - 5.11 MIL/uL Final   Hemoglobin 01/26/2022 11.3 (L)  12.0 - 15.0 g/dL Final   HCT 01/26/2022 36.4  36.0 - 46.0 % Final   MCV 01/26/2022 80.4  80.0 - 100.0 fL Final   MCH 01/26/2022 24.9 (L)  26.0 - 34.0 pg Final   MCHC 01/26/2022 31.0  30.0 - 36.0 g/dL Final   RDW 01/26/2022 17.3 (H)  11.5 - 15.5 % Final   Platelets 01/26/2022 327  150 - 400 K/uL Final   nRBC 01/26/2022 0.0  0.0 - 0.2 % Final   Neutrophils Relative % 01/26/2022 60  % Final   Neutro Abs 01/26/2022 3.9  1.7 - 7.7 K/uL Final   Lymphocytes Relative 01/26/2022 31  % Final   Lymphs Abs 01/26/2022 2.0  0.7 - 4.0 K/uL Final   Monocytes Relative 01/26/2022 7  % Final   Monocytes Absolute 01/26/2022 0.5  0.1 - 1.0 K/uL Final   Eosinophils Relative 01/26/2022 1  % Final   Eosinophils Absolute 01/26/2022 0.1  0.0 - 0.5 K/uL Final   Basophils Relative 01/26/2022 1  % Final   Basophils Absolute 01/26/2022 0.1  0.0 - 0.1 K/uL Final   Immature Granulocytes 01/26/2022 0  % Final   Abs Immature Granulocytes 01/26/2022 0.02  0.00 - 0.07 K/uL Final   Performed at [facility], [place] [address] [place], [place] [address]   Glucose-Capillary 01/26/2022 149 (H)  70 - 99 mg/dL Final   Glucose reference range applies only to samples taken after fasting for at least 8 hours.   Glucose-Capillary 01/26/2022 130 (H)  70 - 99 mg/dL Final   Glucose reference range applies only to samples taken after fasting for at least 8 hours.   Glucose-Capillary 01/26/2022 151 (H)  70 - 99 mg/dL Final   Glucose reference range applies only to samples taken after fasting for at least 8 hours.   Glucose-Capillary 01/26/2022 170 (H)  70 - 99 mg/dL Final   Glucose reference range applies only to samples taken after fasting for at least 8 hours.   Glucose-Capillary 01/27/2022 129 (H)  70 - 99 mg/dL Final   Glucose reference range applies only to samples taken after fasting for at least 8  hours.   Glucose-Capillary 01/27/2022 101 (H)  70 - 99 mg/dL Final   Glucose reference range applies only to samples taken after fasting for at least 8 hours.   Glucose-Capillary 01/27/2022 220 (H)  70 - 99 mg/dL Final   Glucose reference range applies only to samples taken after fasting for at least 8 hours.   Glucose-Capillary 01/27/2022 154 (H)  70 - 99 mg/dL Final   Glucose reference range applies only to samples taken after fasting for at least 8 hours.   Glucose-Capillary 01/27/2022 163 (H)  70 -  99 mg/dL Final   Glucose reference range applies only to samples taken after fasting for at least 8 hours.   Glucose-Capillary 01/28/2022 127 (H)  70 - 99 mg/dL Final   Glucose reference range applies only to samples taken after fasting for at least 8 hours.   Glucose-Capillary 01/28/2022 110 (H)  70 - 99 mg/dL Final   Glucose reference range applies only to samples taken after fasting for at least 8 hours.   Glucose-Capillary 01/28/2022 167 (H)  70 - 99 mg/dL Final   Glucose reference range applies only to samples taken after fasting for at least 8 hours.   Glucose-Capillary 01/29/2022 145 (H)  70 - 99 mg/dL Final   Glucose reference range applies only to samples taken after fasting for at least 8 hours.   Glucose-Capillary 01/29/2022 203 (H)  70 - 99 mg/dL Final   Glucose reference range applies only to samples taken after fasting for at least 8 hours.   Glucose-Capillary 01/29/2022 153 (H)  70 - 99 mg/dL Final   Glucose reference range applies only to samples taken after fasting for at least 8 hours.   Glucose-Capillary 01/29/2022 166 (H)  70 - 99 mg/dL Final   Glucose reference range applies only to samples taken after fasting for at least 8 hours.   Glucose-Capillary 01/30/2022 142 (H)  70 - 99 mg/dL Final   Glucose reference range applies only to samples taken after fasting for at least 8 hours.   Glucose-Capillary 01/30/2022 138 (H)  70 - 99 mg/dL Final   Glucose reference range  applies only to samples taken after fasting for at least 8 hours.   Glucose-Capillary 01/30/2022 185 (H)  70 - 99 mg/dL Final   Glucose reference range applies only to samples taken after fasting for at least 8 hours.   Glucose-Capillary 01/30/2022 220 (H)  70 - 99 mg/dL Final   Glucose reference range applies only to samples taken after fasting for at least 8 hours.   Glucose-Capillary 01/31/2022 147 (H)  70 - 99 mg/dL Final   Glucose reference range applies only to samples taken after fasting for at least 8 hours.   Glucose-Capillary 01/31/2022 131 (H)  70 - 99 mg/dL Final   Glucose reference range applies only to samples taken after fasting for at least 8 hours.   Glucose-Capillary 01/31/2022 169 (H)  70 - 99 mg/dL Final   Glucose reference range applies only to samples taken after fasting for at least 8 hours.   Glucose-Capillary 01/31/2022 144 (H)  70 - 99 mg/dL Final   Glucose reference range applies only to samples taken after fasting for at least 8 hours.   Comment 1 01/31/2022 RN AWARE   Final   Glucose-Capillary 02/01/2022 117 (H)  70 - 99 mg/dL Final   Glucose reference range applies only to samples taken after fasting for at least 8 hours.  Admission on 01/23/2022, Discharged on 01/24/2022  Component Date Value Ref Range Status   Sodium 01/23/2022 140  135 - 145 mmol/L Final   Potassium 01/23/2022 4.4  3.5 - 5.1 mmol/L Final   Chloride 01/23/2022 101  98 - 111 mmol/L Final   CO2 01/23/2022 25  22 - 32 mmol/L Final   Glucose, Bld 01/23/2022 198 (H)  70 - 99 mg/dL Final   Glucose reference range applies only to samples taken after fasting for at least 8 hours.   BUN 01/23/2022 13  6 - 20 mg/dL Final   Creatinine, Ser 01/23/2022 0.62  0.44 - 1.00 mg/dL Final   Calcium 01/23/2022 9.3  8.9 - 10.3 mg/dL Final   GFR, Estimated 01/23/2022 >60  >60 mL/min Final   Comment: (NOTE) Calculated using the CKD-EPI Creatinine Equation (2021)    Anion gap 01/23/2022 14  5 - 15 Final    Performed at [facility], [place] [address], [place], [place] [address]   WBC 01/23/2022 5.8  4.0 - 10.5 K/uL Final   RBC 01/23/2022 4.63  3.87 - 5.11 MIL/uL Final   Hemoglobin 01/23/2022 11.7 (L)  12.0 - 15.0 g/dL Final   HCT 01/23/2022 37.4  36.0 - 46.0 % Final   MCV 01/23/2022 80.8  80.0 - 100.0 fL Final   MCH 01/23/2022 25.3 (L)  26.0 - 34.0 pg Final   MCHC 01/23/2022 31.3  30.0 - 36.0 g/dL Final   RDW 01/23/2022 17.9 (H)  11.5 - 15.5 % Final   Platelets 01/23/2022 333  150 - 400 K/uL Final   nRBC 01/23/2022 0.0  0.0 - 0.2 % Final   Performed at [facility] [address] [place], [place] [address]   Troponin I (High Sensitivity) 01/23/2022 6  <18 ng/L Final   Comment: (NOTE) Elevated high sensitivity troponin I (hsTnI) values and significant  changes across serial measurements may suggest ACS but many other  chronic and acute conditions are known to elevate hsTnI results.  Refer to the "Links" section for chest pain algorithms and additional  guidance. Performed at [facility], [place] [address] [place], [place] [address]    Troponin I (High Sensitivity) 01/23/2022 5  <18 ng/L Final   Comment: (NOTE) Elevated high sensitivity troponin I (hsTnI) values and significant  changes across serial measurements may suggest ACS but many other  chronic and acute conditions are known to elevate hsTnI results.  Refer to the "Links" section for chest pain algorithms and additional  guidance. Performed at [facility], [place] [address], [place], [place] [address]    SARS Coronavirus 2 by RT PCR 01/23/2022 NEGATIVE  NEGATIVE Final   Comment: (NOTE) SARS-CoV-2 target nucleic acids are NOT DETECTED.  The SARS-CoV-2 RNA is generally detectable in upper and lower respiratory specimens during the acute phase of infection. The lowest concentration of SARS-CoV-2 viral copies this assay can detect is 250 copies / mL. A negative result does not preclude SARS-CoV-2  infection and should not be used as the sole basis for treatment or other patient management decisions.  A negative result may occur with improper specimen collection / handling, submission of specimen other than nasopharyngeal swab, presence of viral mutation(s) within the areas targeted by this assay, and inadequate number of viral copies (<250 copies / mL). A negative result must be combined with clinical observations, patient history, and epidemiological information.  Fact Sheet for Patients:   https://www.patel.info/  Fact Sheet for Healthcare Providers: https://hall.com/  This test is not yet approved or                           cleared by the [place] FDA and has been authorized for detection and/or diagnosis of SARS-CoV-2 by FDA under an Emergency Use Authorization (EUA).  This EUA will remain in effect (meaning this test can be used) for the duration of the COVID-19 declaration under Section 564(b)(1) of the Act, 21 U.S.C. section 360bbb-3(b)(1), unless the authorization is terminated or revoked sooner.  Performed at [facility], [address] [place], [place] [address]    B Natriuretic Peptide 01/23/2022 41.3  0.0 - 100.0 pg/mL Final   Performed at [facility] [address], [place], [place] [address]   Group A Strep by PCR 01/23/2022 NOT DETECTED  NOT DETECTED Final   Performed at [facility], [place] [address] [place], [place] [address]   Color, Urine 01/23/2022 YELLOW  YELLOW Final   APPearance 01/23/2022 CLEAR  CLEAR Final   Specific Gravity, Urine 01/23/2022 1.018  1.005 - 1.030 Final   pH 01/23/2022 7.0  5.0 - 8.0 Final   Glucose, UA 01/23/2022 NEGATIVE  NEGATIVE mg/dL Final   Hgb urine dipstick 01/23/2022 NEGATIVE  NEGATIVE Final   Bilirubin Urine 01/23/2022 NEGATIVE  NEGATIVE Final   Ketones, ur 01/23/2022 NEGATIVE  NEGATIVE mg/dL Final   Protein, ur 01/23/2022 NEGATIVE  NEGATIVE mg/dL  Final   Nitrite 01/23/2022 NEGATIVE  NEGATIVE Final   Leukocytes,Ua 01/23/2022 TRACE (A)  NEGATIVE Final   RBC / HPF 01/23/2022 0-5  0 - 5 RBC/hpf Final   WBC, UA 01/23/2022 0-5  0 - 5 WBC/hpf Final   Bacteria, UA 01/23/2022 NONE SEEN  NONE SEEN Final   Squamous Epithelial / LPF 01/23/2022 0-5  0 - 5 Final   Mucus 01/23/2022 PRESENT   Final   Performed at [facility], [place] [address], [place], [place] [address]   SARS Coronavirus 2 by RT PCR 01/23/2022 NEGATIVE  NEGATIVE Final   Comment: (NOTE) SARS-CoV-2 target nucleic acids are NOT DETECTED.  The SARS-CoV-2 RNA is generally detectable in upper respiratory specimens during the acute phase of infection. The lowest concentration of SARS-CoV-2 viral copies this assay can detect is 138 copies/mL. A negative result does not preclude SARS-Cov-2 infection and should not be used as the sole basis for treatment or other patient management decisions. A negative result may occur with  improper specimen collection/handling, submission of specimen other than nasopharyngeal swab, presence of viral mutation(s) within the areas targeted by this assay, and inadequate number of viral copies(<138 copies/mL). A negative result must be combined with clinical observations, patient history, and epidemiological information. The expected result is Negative.  Fact Sheet for Patients:  EntrepreneurPulse.com.au  Fact Sheet for Healthcare Providers:  IncredibleEmployment.be  This test is no                          t yet approved or cleared by the [place] FDA and  has been authorized for detection and/or diagnosis of SARS-CoV-2 by FDA under an Emergency Use Authorization (EUA). This EUA will remain  in effect (meaning this test can be used) for the duration of the COVID-19 declaration under Section 564(b)(1) of the Act, 21 U.S.C.section 360bbb-3(b)(1), unless the authorization is terminated  or revoked  sooner.       Influenza A by PCR 01/23/2022 NEGATIVE  NEGATIVE Final   Influenza B by PCR 01/23/2022 NEGATIVE  NEGATIVE Final   Comment: (NOTE) The Xpert Xpress SARS-CoV-2/FLU/RSV plus assay is intended as an aid in the diagnosis of influenza from Nasopharyngeal swab specimens and should not be used as a sole basis for treatment. Nasal washings and aspirates are unacceptable for Xpert Xpress SARS-CoV-2/FLU/RSV testing.  Fact Sheet for Patients: EntrepreneurPulse.com.au  Fact Sheet for Healthcare Providers: IncredibleEmployment.be  This test is not yet approved or cleared by the [place] FDA and has been authorized for detection and/or diagnosis of SARS-CoV-2 by FDA under  an Emergency Use Authorization (EUA). This EUA will remain in effect (meaning this test can be used) for the duration of the COVID-19 declaration under Section 564(b)(1) of the Act, 21 U.S.C. section 360bbb-3(b)(1), unless the authorization is terminated or revoked.  Performed at [facility], [place] [address], [place], [place] [address]    WBC 01/24/2022 6.1  4.0 - 10.5 K/uL Final   RBC 01/24/2022 4.60  3.87 - 5.11 MIL/uL Final   Hemoglobin 01/24/2022 11.7 (L)  12.0 - 15.0 g/dL Final   HCT 01/24/2022 37.0  36.0 - 46.0 % Final   MCV 01/24/2022 80.4  80.0 - 100.0 fL Final   MCH 01/24/2022 25.4 (L)  26.0 - 34.0 pg Final   MCHC 01/24/2022 31.6  30.0 - 36.0 g/dL Final   RDW 01/24/2022 17.6 (H)  11.5 - 15.5 % Final   Platelets 01/24/2022 334  150 - 400 K/uL Final   nRBC 01/24/2022 0.0  0.0 - 0.2 % Final   Neutrophils Relative % 01/24/2022 53  % Final   Neutro Abs 01/24/2022 3.3  1.7 - 7.7 K/uL Final   Lymphocytes Relative 01/24/2022 33  % Final   Lymphs Abs 01/24/2022 2.0  0.7 - 4.0 K/uL Final   Monocytes Relative 01/24/2022 11  % Final   Monocytes Absolute 01/24/2022 0.7  0.1 - 1.0 K/uL Final   Eosinophils Relative 01/24/2022 2  % Final   Eosinophils Absolute  01/24/2022 0.1  0.0 - 0.5 K/uL Final   Basophils Relative 01/24/2022 1  % Final   Basophils Absolute 01/24/2022 0.1  0.0 - 0.1 K/uL Final   Immature Granulocytes 01/24/2022 0  % Final   Abs Immature Granulocytes 01/24/2022 0.02  0.00 - 0.07 K/uL Final   Performed at [facility], [place] [address], [place], [place] [address]   Glucose-Capillary 01/24/2022 110 (H)  70 - 99 mg/dL Final   Glucose reference range applies only to samples taken after fasting for at least 8 hours.  No results displayed because visit has over 200 results.    Admission on 11/22/2021, Discharged on 11/22/2021  Component Date Value Ref Range Status   Glucose-Capillary 11/22/2021 169 (H)  70 - 99 mg/dL Final   Glucose reference range applies only to samples taken after fasting for at least 8 hours.  No results displayed because visit has over 200 results.    Admission on 10/04/2021, Discharged on 10/22/2021  Component Date Value Ref Range Status   SARS Coronavirus 2 by RT PCR 10/04/2021 NEGATIVE  NEGATIVE Final   Comment: (NOTE) SARS-CoV-2 target nucleic acids are NOT DETECTED.  The SARS-CoV-2 RNA is generally detectable in upper respiratory specimens during the acute phase of infection. The lowest concentration of SARS-CoV-2 viral copies this assay can detect is 138 copies/mL. A negative result does not preclude SARS-Cov-2 infection and should not be used as the sole basis for treatment or other patient management decisions. A negative result may occur with  improper specimen collection/handling, submission of specimen other than nasopharyngeal swab, presence of viral mutation(s) within the areas targeted by this assay, and inadequate number of viral copies(<138 copies/mL). A negative result must be combined with clinical observations, patient history, and epidemiological information. The expected result is Negative.  Fact Sheet for Patients:  EntrepreneurPulse.com.au  Fact Sheet for  Healthcare Providers:  IncredibleEmployment.be  This test is no  t yet approved or cleared by the [place] and  has been authorized for detection and/or diagnosis of SARS-CoV-2 by FDA under an Emergency Use Authorization (EUA). This EUA will remain  in effect (meaning this test can be used) for the duration of the COVID-19 declaration under Section 564(b)(1) of the Act, 21 U.S.C.section 360bbb-3(b)(1), unless the authorization is terminated  or revoked sooner.       Influenza A by PCR 10/04/2021 NEGATIVE  NEGATIVE Final   Influenza B by PCR 10/04/2021 NEGATIVE  NEGATIVE Final   Comment: (NOTE) The Xpert Xpress SARS-CoV-2/FLU/RSV plus assay is intended as an aid in the diagnosis of influenza from Nasopharyngeal swab specimens and should not be used as a sole basis for treatment. Nasal washings and aspirates are unacceptable for Xpert Xpress SARS-CoV-2/FLU/RSV testing.  Fact Sheet for Patients: EntrepreneurPulse.com.au  Fact Sheet for Healthcare Providers: IncredibleEmployment.be  This test is not yet approved or cleared by the [place] FDA and has been authorized for detection and/or diagnosis of SARS-CoV-2 by FDA under an Emergency Use Authorization (EUA). This EUA will remain in effect (meaning this test can be used) for the duration of the COVID-19 declaration under Section 564(b)(1) of the Act, 21 U.S.C. section 360bbb-3(b)(1), unless the authorization is terminated or revoked.  Performed at [facility], [place] [address] [place], [place] [address]    WBC 10/04/2021 8.4  4.0 - 10.5 K/uL Final   RBC 10/04/2021 4.42  3.87 - 5.11 MIL/uL Final   Hemoglobin 10/04/2021 11.6 (L)  12.0 - 15.0 g/dL Final   HCT 10/04/2021 35.7 (L)  36.0 - 46.0 % Final   MCV 10/04/2021 80.8  80.0 - 100.0 fL Final   MCH 10/04/2021 26.2  26.0 - 34.0 pg Final   MCHC 10/04/2021 32.5  30.0 - 36.0 g/dL  Final   RDW 10/04/2021 16.0 (H)  11.5 - 15.5 % Final   Platelets 10/04/2021 372  150 - 400 K/uL Final   nRBC 10/04/2021 0.0  0.0 - 0.2 % Final   Neutrophils Relative % 10/04/2021 68  % Final   Neutro Abs 10/04/2021 5.7  1.7 - 7.7 K/uL Final   Lymphocytes Relative 10/04/2021 22  % Final   Lymphs Abs 10/04/2021 1.8  0.7 - 4.0 K/uL Final   Monocytes Relative 10/04/2021 8  % Final   Monocytes Absolute 10/04/2021 0.7  0.1 - 1.0 K/uL Final   Eosinophils Relative 10/04/2021 1  % Final   Eosinophils Absolute 10/04/2021 0.1  0.0 - 0.5 K/uL Final   Basophils Relative 10/04/2021 1  % Final   Basophils Absolute 10/04/2021 0.1  0.0 - 0.1 K/uL Final   Immature Granulocytes 10/04/2021 0  % Final   Abs Immature Granulocytes 10/04/2021 0.03  0.00 - 0.07 K/uL Final   Performed at [facility], [place] [address] [place], [place] [address]   Sodium 10/04/2021 136  135 - 145 mmol/L Final   Potassium 10/04/2021 4.2  3.5 - 5.1 mmol/L Final   Chloride 10/04/2021 104  98 - 111 mmol/L Final   CO2 10/04/2021 25  22 - 32 mmol/L Final   Glucose, Bld 10/04/2021 100 (H)  70 - 99 mg/dL Final   Glucose reference range applies only to samples taken after fasting for at least 8 hours.   BUN 10/04/2021 9  6 - 20 mg/dL Final   Creatinine, Ser 10/04/2021 0.52  0.44 - 1.00 mg/dL Final   Calcium 10/04/2021 9.0  8.9 - 10.3 mg/dL Final  Total Protein 10/04/2021 7.0  6.5 - 8.1 g/dL Final   Albumin 10/04/2021 3.2 (L)  3.5 - 5.0 g/dL Final   AST 10/04/2021 13 (L)  15 - 41 U/L Final   ALT 10/04/2021 10  0 - 44 U/L Final   Alkaline Phosphatase 10/04/2021 61  38 - 126 U/L Final   Total Bilirubin 10/04/2021 0.3  0.3 - 1.2 mg/dL Final   GFR, Estimated 10/04/2021 >60  >60 mL/min Final   Comment: (NOTE) Calculated using the CKD-EPI Creatinine Equation (2021)    Anion gap 10/04/2021 7  5 - 15 Final   Performed at [facility], [place] [address] [place], [place] [address]   Hgb A1c MFr Bld 10/04/2021 7.0 (H)  4.8 - 5.6 %  Final   Comment: (NOTE) Pre diabetes:          5.7%-6.4%  Diabetes:              >6.4%  Glycemic control for   <7.0% adults with diabetes    Mean Plasma Glucose 10/04/2021 154.2  mg/dL Final   Performed at [facility], [place] [address] [place], [place] [address]   Cholesterol 10/04/2021 178  0 - 200 mg/dL Final   Triglycerides 10/04/2021 155 (H)  <150 mg/dL Final   HDL 10/04/2021 52  >40 mg/dL Final   Total CHOL/HDL Ratio 10/04/2021 3.4  RATIO Final   VLDL 10/04/2021 31  0 - 40 mg/dL Final   LDL Cholesterol 10/04/2021 95  0 - 99 mg/dL Final   Comment:        Total Cholesterol/HDL:CHD Risk Coronary Heart Disease Risk Table                     Men   Women  1/2 Average Risk   3.4   3.3  Average Risk       5.0   4.4  2 X Average Risk   9.6   7.1  3 X Average Risk  23.4   11.0        Use the calculated Patient Ratio above and the CHD Risk Table to determine the patient's CHD Risk.        ATP III CLASSIFICATION (LDL):  <100     mg/dL   Optimal  100-129  mg/dL   Near or Above                    Optimal  130-159  mg/dL   Borderline  160-189  mg/dL   High  >190     mg/dL   Very High Performed at [facility] [address] [place], [place] [address]    POC Amphetamine UR 10/04/2021 None Detected  NONE DETECTED (Cut Off Level 1000 ng/mL) Final   POC Secobarbital (BAR) 10/04/2021 None Detected  NONE DETECTED (Cut Off Level 300 ng/mL) Final   POC Buprenorphine (BUP) 10/04/2021 None Detected  NONE DETECTED (Cut Off Level 10 ng/mL) Final   POC Oxazepam (BZO) 10/04/2021 None Detected  NONE DETECTED (Cut Off Level 300 ng/mL) Final   POC Cocaine UR 10/04/2021 None Detected  NONE DETECTED (Cut Off Level 300 ng/mL) Final   POC Methamphetamine UR 10/04/2021 None Detected  NONE DETECTED (Cut Off Level 1000 ng/mL) Final   POC Morphine 10/04/2021 None Detected  NONE DETECTED (Cut Off Level 300 ng/mL) Final   POC Methadone UR 10/04/2021 None Detected  NONE DETECTED (Cut Off Level  300 ng/mL) Final  POC Oxycodone UR 10/04/2021 Positive (A)  NONE DETECTED (Cut Off Level 100 ng/mL) Final   POC Marijuana UR 10/04/2021 None Detected  NONE DETECTED (Cut Off Level 50 ng/mL) Final   SARSCOV2ONAVIRUS 2 AG 10/04/2021 NEGATIVE  NEGATIVE Final   Comment: (NOTE) SARS-CoV-2 antigen NOT DETECTED.   Negative results are presumptive.  Negative results do not preclude SARS-CoV-2 infection and should not be used as the sole basis for treatment or other patient management decisions, including infection  control decisions, particularly in the presence of clinical signs and  symptoms consistent with COVID-19, or in those who have been in contact with the virus.  Negative results must be combined with clinical observations, patient history, and epidemiological information. The expected result is Negative.  Fact Sheet for Patients: HandmadeRecipes.com.cy  Fact Sheet for Healthcare Providers: FuneralLife.at  This test is not yet approved or cleared by the [place] FDA and  has been authorized for detection and/or diagnosis of SARS-CoV-2 by FDA under an Emergency Use Authorization (EUA).  This EUA will remain in effect (meaning this test can be used) for the duration of  the COV                          ID-19 declaration under Section 564(b)(1) of the Act, 21 U.S.C. section 360bbb-3(b)(1), unless the authorization is terminated or revoked sooner.     Valproic Acid Lvl 10/04/2021 51  50.0 - 100.0 ug/mL Final   Performed at [facility] [address] [place], [place] [address]   Valproic Acid Lvl 10/08/2021 57  50.0 - 100.0 ug/mL Final   Performed at [facility] [address], [place], [place] [address]   Glucose-Capillary 10/09/2021 123 (H)  70 - 99 mg/dL Final   Glucose reference range applies only to samples taken after fasting for at least 8 hours.  Admission on 09/09/2021, Discharged on 09/10/2021  Component  Date Value Ref Range Status   Sodium 09/09/2021 134 (L)  135 - 145 mmol/L Final   Potassium 09/09/2021 4.3  3.5 - 5.1 mmol/L Final   Chloride 09/09/2021 99  98 - 111 mmol/L Final   CO2 09/09/2021 25  22 - 32 mmol/L Final   Glucose, Bld 09/09/2021 107 (H)  70 - 99 mg/dL Final   Glucose reference range applies only to samples taken after fasting for at least 8 hours.   BUN 09/09/2021 12  6 - 20 mg/dL Final   Creatinine, Ser 09/09/2021 0.74  0.44 - 1.00 mg/dL Final   Calcium 09/09/2021 9.0  8.9 - 10.3 mg/dL Final   Total Protein 09/09/2021 7.4  6.5 - 8.1 g/dL Final   Albumin 09/09/2021 3.1 (L)  3.5 - 5.0 g/dL Final   AST 09/09/2021 13 (L)  15 - 41 U/L Final   ALT 09/09/2021 13  0 - 44 U/L Final   Alkaline Phosphatase 09/09/2021 66  38 - 126 U/L Final   Total Bilirubin 09/09/2021 0.2 (L)  0.3 - 1.2 mg/dL Final   GFR, Estimated 09/09/2021 >60  >60 mL/min Final   Comment: (NOTE) Calculated using the CKD-EPI Creatinine Equation (2021)    Anion gap 09/09/2021 10  5 - 15 Final   Performed at [facility], [place] [address], [place], [place] [address]   WBC 09/09/2021 8.5  4.0 - 10.5 K/uL Final   RBC 09/09/2021 4.53  3.87 - 5.11 MIL/uL Final   Hemoglobin 09/09/2021 11.8 (L)  12.0 -  15.0 g/dL Final   HCT 09/09/2021 38.0  36.0 - 46.0 % Final   MCV 09/09/2021 83.9  80.0 - 100.0 fL Final   MCH 09/09/2021 26.0  26.0 - 34.0 pg Final   MCHC 09/09/2021 31.1  30.0 - 36.0 g/dL Final   RDW 09/09/2021 17.8 (H)  11.5 - 15.5 % Final   Platelets 09/09/2021 442 (H)  150 - 400 K/uL Final   nRBC 09/09/2021 0.0  0.0 - 0.2 % Final   Neutrophils Relative % 09/09/2021 70  % Final   Neutro Abs 09/09/2021 5.9  1.7 - 7.7 K/uL Final   Lymphocytes Relative 09/09/2021 20  % Final   Lymphs Abs 09/09/2021 1.7  0.7 - 4.0 K/uL Final   Monocytes Relative 09/09/2021 8  % Final   Monocytes Absolute 09/09/2021 0.7  0.1 - 1.0 K/uL Final   Eosinophils Relative 09/09/2021 1  % Final   Eosinophils Absolute 09/09/2021 0.1   0.0 - 0.5 K/uL Final   Basophils Relative 09/09/2021 1  % Final   Basophils Absolute 09/09/2021 0.1  0.0 - 0.1 K/uL Final   Immature Granulocytes 09/09/2021 0  % Final   Abs Immature Granulocytes 09/09/2021 0.03  0.00 - 0.07 K/uL Final   Performed at [facility], [place] [address], [place], [place] [address]   Ammonia 09/09/2021 37 (H)  9 - 35 umol/L Final   Comment: HEMOLYSIS AT THIS LEVEL MAY AFFECT RESULT Performed at [facility], [place] [address], [place], [place] [address]    Opiates 09/09/2021 NONE DETECTED  NONE DETECTED Final   Cocaine 09/09/2021 NONE DETECTED  NONE DETECTED Final   Benzodiazepines 09/09/2021 NONE DETECTED  NONE DETECTED Final   Amphetamines 09/09/2021 NONE DETECTED  NONE DETECTED Final   Tetrahydrocannabinol 09/09/2021 NONE DETECTED  NONE DETECTED Final   Barbiturates 09/09/2021 NONE DETECTED  NONE DETECTED Final   Comment: (NOTE) DRUG SCREEN FOR MEDICAL PURPOSES ONLY.  IF CONFIRMATION IS NEEDED FOR ANY PURPOSE, NOTIFY LAB WITHIN 5 DAYS.  LOWEST DETECTABLE LIMITS FOR URINE DRUG SCREEN Drug Class                     Cutoff (ng/mL) Amphetamine and metabolites    1000 Barbiturate and metabolites    200 Benzodiazepine                 013 Tricyclics and metabolites     300 Opiates and metabolites        300 Cocaine and metabolites        300 THC                            50 Performed at [facility], [place] [address], [place], [place] [address]    Alcohol, Ethyl (B) 09/09/2021 <10  <10 mg/dL Final   Comment: (NOTE) Lowest detectable limit for serum alcohol is 10 mg/dL.  For medical purposes only. Performed at [facility], [place] [address] [place], [place] [address]    Lipase 09/09/2021 33  11 - 51 U/L Final   Performed at [facility] [address], [place], [place] [address]   Color, Urine 09/09/2021 COLORLESS (A)  YELLOW Final   APPearance 09/09/2021 CLEAR  CLEAR Final   Specific Gravity, Urine 09/09/2021 1.002 (L)   1.005 - 1.030 Final   pH 09/09/2021 6.0  5.0 - 8.0 Final   Glucose, UA 09/09/2021 NEGATIVE  NEGATIVE mg/dL Final   Hgb urine dipstick 09/09/2021 NEGATIVE  NEGATIVE Final   Bilirubin Urine 09/09/2021 NEGATIVE  NEGATIVE Final   Ketones, ur 09/09/2021 NEGATIVE  NEGATIVE mg/dL Final   Protein, ur 09/09/2021 NEGATIVE  NEGATIVE mg/dL Final   Nitrite 09/09/2021 NEGATIVE  NEGATIVE Final   Leukocytes,Ua 09/09/2021 NEGATIVE  NEGATIVE Final   Performed at [facility], [place] [address], [place], [place] [address]   Specimen Description 09/09/2021 URINE, CLEAN CATCH   Final   Special Requests 09/09/2021 NONE   Final   Culture 09/09/2021  (A)   Final                   Value:<10,000 COLONIES/mL INSIGNIFICANT GROWTH Performed at [facility], [place] [address], [place], [place] [address]    Report Status 09/09/2021 09/10/2021 FINAL   Final   D-Dimer, Quant 09/09/2021 0.92 (H)  0.00 - 0.50 ug/mL-FEU Final   Comment: (NOTE) At the manufacturer cut-off value of 0.5 g/mL FEU, this assay has a negative predictive value of 95-100%.This assay is intended for use in conjunction with a clinical pretest probability (PTP) assessment model to exclude pulmonary embolism (PE) and deep venous thrombosis (DVT) in outpatients suspected of PE or DVT. Results should be correlated with clinical presentation. Performed at [facility], [place] [address] [place], [place] [address]    Troponin I (High Sensitivity) 09/09/2021 5  <18 ng/L Final   Comment: (NOTE) Elevated high sensitivity troponin I (hsTnI) values and significant  changes across serial measurements may suggest ACS but many other  chronic and acute conditions are known to elevate hsTnI results.  Refer to the "Links" section for chest pain algorithms and additional  guidance. Performed at [facility], [place] [address] [place], [place] [address]    BP 09/10/2021 135/83  mmHg Final   S' Lateral 09/10/2021 2.30  cm Final   Area-P 1/2  09/10/2021 5.58  cm2 Final    Blood Alcohol level:  Lab Results  Component Value Date   ETH <10 11/23/2021   ETH <10 35/00/9381    Metabolic Disorder Labs: Lab Results  Component Value Date   HGBA1C 6.7 (H) 12/28/2021   MPG 145.59 12/28/2021   MPG 154.2 10/04/2021   Lab Results  Component Value Date   PROLACTIN 3.8 (L) 11/23/2021   Lab Results  Component Value Date   CHOL 171 11/23/2021   TRIG 125 11/23/2021   HDL 65 11/23/2021   CHOLHDL 2.6 11/23/2021   VLDL 25 11/23/2021   LDLCALC 81 11/23/2021   LDLCALC 95 10/04/2021    Therapeutic Lab Levels: No results found for: "LITHIUM" Lab Results  Component Value Date   VALPROATE 33 (L) 01/18/2022   VALPROATE 33 (L) 12/28/2021   No results found for: "CBMZ"  Physical Findings   PHQ2-9    Flowsheet Row ED from 11/23/2021 in [facility]  PHQ-2 Total Score 2  PHQ-9 Total Score 3      Flowsheet Row ED from 01/23/2022 in [facility] ED from [facility]/23/2021 in [facility] ED from 11/22/2021 in [facility] DEPT  C-SSRS RISK CATEGORY No Risk No Risk No Risk        Musculoskeletal  Strength & Muscle Tone: within normal limits Gait & Station: normal Patient leans: N/A  Psychiatric Specialty Exam  Presentation  General Appearance:  Appropriate for Environment; Casual  Eye Contact: Good  Speech:  Clear and Coherent; Normal Rate  Speech Volume: Normal  Handedness: Right   Mood and Affect  Mood: Depressed  Affect: Congruent   Thought Process  Thought Processes: Coherent  Descriptions of Associations:Intact  Orientation:Full (Time, Place and Person)  Thought Content:Logical  Diagnosis of Schizophrenia or Schizoaffective disorder in past: No  Duration of Psychotic Symptoms: No data recorded  Hallucinations:Hallucinations: None  Ideas of Reference:None  Suicidal  Thoughts:Suicidal Thoughts: No  Homicidal Thoughts:Homicidal Thoughts: No   Sensorium  Memory: Immediate Good; Recent Good; Remote Good  Judgment: Good  Insight: Good   Executive Functions  Concentration: Good  Attention Span: Good  Recall: Good  Fund of Knowledge: Good  Language: Good   Psychomotor Activity  Psychomotor Activity: Psychomotor Activity: Normal   Assets  Assets: Physical Health; Resilience; Leisure Time; Armed forces logistics/support/administrative officer; Desire for Improvement   Sleep  Sleep: Sleep: Good   No data recorded  Physical Exam  Physical Exam Vitals and nursing note reviewed.  Constitutional:      General: She is not in acute distress.    Appearance: Normal appearance. She is not ill-appearing.  HENT:     Head: Normocephalic.  Eyes:     General:        Right eye: No discharge.        Left eye: No discharge.     Conjunctiva/sclera: Conjunctivae normal.  Cardiovascular:     Rate and Rhythm: Normal rate.  Pulmonary:     Effort: Pulmonary effort is normal.  Musculoskeletal:        General: Normal range of motion.     Cervical back: Normal range of motion.  Skin:    Coloration: Skin is not jaundiced or pale.  Neurological:     Mental Status: She is alert and oriented to person, place, and time.  Psychiatric:        Attention and Perception: Attention and perception normal.        Mood and Affect: Affect normal.        Speech: Speech normal.        Behavior: Behavior normal. Behavior is cooperative.        Thought Content: Thought content normal.        Cognition and Memory: Cognition normal.        Judgment: Judgment normal.    Review of Systems  Constitutional: Negative.   HENT: Negative.    Eyes: Negative.   Respiratory: Negative.    Cardiovascular: Negative.   Musculoskeletal: Negative.   Skin: Negative.   Neurological: Negative.   Psychiatric/Behavioral: Negative.     Blood pressure 121/75, pulse 78, temperature 98.1 F (36.7  C), temperature source Oral, resp. rate 18, SpO2 95 %. There is no height or weight on file to calculate BMI.   Treatment Plan Summary:   Patient remains cleared by psychiatry. TOC team seeking disposition plan.  Will continue to have daily contact with patient to assess and evaluate symptoms and progress in treatment  Revonda Humphrey, NP 02/01/2022 10:02 AM

## 2022-02-01 NOTE — ED Provider Notes (Signed)
DM consult complete. Per recommendation will increase Semglee to 12 unites daily.

## 2022-02-01 NOTE — Inpatient Diabetes Management (Signed)
Inpatient Diabetes Program Recommendations  AACE/ADA: New Consensus Statement on Inpatient Glycemic Control (2015)  Target Ranges:  Prepandial:   less than 140 mg/dL      Peak postprandial:   less than 180 mg/dL (1-2 hours)      Critically ill patients:  140 - 180 mg/dL   Lab Results  Component Value Date   GLUCAP 180 (H) 02/01/2022   HGBA1C 6.7 (H) 12/28/2021    Review of Glycemic Control  Latest Reference Range & Units 01/31/22 16:20 01/31/22 20:06 02/01/22 07:32 02/01/22 11:29  Glucose-Capillary 70 - 99 mg/dL 546 (H) 503 (H) 546 (H) 180 (H)  (H): Data is abnormally high Diabetes history: Type 2 DM Outpatient Diabetes medications: Lantus 12 units QD, Novolog 0-10 units TID, Metformin 500 mg BID Current orders for Inpatient glycemic control: Semglee 8 units QD, Novolog 0-9 units TID, Metformin 1000 mg BID  Inpatient Diabetes Program Recommendations:    Noted consult.   Could consider: -changing diet to carb modified -increasing Semglee to 12 units QD (home dose).  Thanks, Lujean Rave, MSN, RNC-OB Diabetes Coordinator [phone] (8a-5p)

## 2022-02-01 NOTE — ED Notes (Signed)
Patient resting quietly in bed watching television and interacting with peers. Denies SI, HI and AVH. Respirations equal and unlabored.  Routine safety checks conducted according to facility protocol. Will continue to monitor for safety.

## 2022-02-02 DIAGNOSIS — F209 Schizophrenia, unspecified: Secondary | ICD-10-CM | POA: Diagnosis not present

## 2022-02-02 LAB — CBC WITH DIFFERENTIAL/PLATELET
Abs Immature Granulocytes: 0.03 10*3/uL (ref 0.00–0.07)
Basophils Absolute: 0.1 10*3/uL (ref 0.0–0.1)
Basophils Relative: 1 %
Eosinophils Absolute: 0.1 10*3/uL (ref 0.0–0.5)
Eosinophils Relative: 1 %
HCT: 35 % — ABNORMAL LOW (ref 36.0–46.0)
Hemoglobin: 11.3 g/dL — ABNORMAL LOW (ref 12.0–15.0)
Immature Granulocytes: 0 %
Lymphocytes Relative: 28 %
Lymphs Abs: 2.1 10*3/uL (ref 0.7–4.0)
MCH: 25 pg — ABNORMAL LOW (ref 26.0–34.0)
MCHC: 32.3 g/dL (ref 30.0–36.0)
MCV: 77.4 fL — ABNORMAL LOW (ref 80.0–100.0)
Monocytes Absolute: 0.5 10*3/uL (ref 0.1–1.0)
Monocytes Relative: 7 %
Neutro Abs: 4.7 10*3/uL (ref 1.7–7.7)
Neutrophils Relative %: 63 %
Platelets: 345 10*3/uL (ref 150–400)
RBC: 4.52 MIL/uL (ref 3.87–5.11)
RDW: 16.7 % — ABNORMAL HIGH (ref 11.5–15.5)
WBC: 7.5 10*3/uL (ref 4.0–10.5)
nRBC: 0 % (ref 0.0–0.2)

## 2022-02-02 LAB — GLUCOSE, CAPILLARY
Glucose-Capillary: 131 mg/dL — ABNORMAL HIGH (ref 70–99)
Glucose-Capillary: 95 mg/dL (ref 70–99)
Glucose-Capillary: 120 mg/dL — ABNORMAL HIGH (ref 70–99)
Glucose-Capillary: 191 mg/dL — ABNORMAL HIGH (ref 70–99)

## 2022-02-02 NOTE — ED Notes (Signed)
Pt sleeping at present, no distress noted.  Monitoring for safety. 

## 2022-02-02 NOTE — ED Provider Notes (Signed)
Behavioral Health Progress Note  Date and Time: 02/02/2022 10:42 PM Name: Teresa Murillo MRN:  [id number]  Subjective:  Subjective:  Teresa Murillo 61 y.o., female patient presented Teresa Murillo 61 y.o., female patient with a past psychiatric history of schizophrenia, aggressive behavior, and borderline intellectual disability who initially presented to [facility] on 11/23/2021 via [facility].  At that time patient had been dismissed from the group home due to multiple elopements.  Patient has remained at the [facility] while awaiting new placement.  Social work and DSS are involved    Teresa Murillo, 61 y.o., female patient seen face to face by this provider and chart reviewed on 02/02/22.    Today's evaluation Teresa Murillo is observed talking with the patient next to her.  She is seen smiling and laughing.  Teresa Murillo is pleasant upon approach.  She is alert/oriented x 4, cooperative, calm, and attentive.  She has normal speech and behavior.  She expresses her excitement that there is a group home that may take her.  Reports she has spent her time coloring and taping art on the walls.  She has denied any concerns with appetite or sleep.  She is denying any depression today.  She denies SI/HI/AVH.  She does not appear to be responding to internal/external stimuli.   Teresa Murillo remains psychiatrically clear.  TOC and DSS continue to seek placement.   Diagnosis:  Final diagnoses:  Schizophrenia, unspecified type ([place])  At risk for self care deficit  Noncompliance  Type 2 diabetes mellitus without complication, unspecified whether long term insulin use ([place])    Total Time spent with patient: 30 minutes  Past Psychiatric History: see H&P Past Medical History:  Past Medical History:  Diagnosis Date   Borderline intellectual functioning 12/14/2021   On 12/14/2021: Appreciate assistance from psychology consult. On the Wechsler Adult Intelligence Scale-4, Ms. Dedeaux achieved a full-scale IQ score of 73 and  a percentile rank of 4 placing her in the borderline range of intellectual functioning.    Chronic obstructive pulmonary disease (COPD) (HCC)    Glaucoma    Hyperlipidemia    Hypertension    Iron deficiency    Schizoaffective disorder (HCC)    Type 2 diabetes mellitus (HCC)     Past Surgical History:  Procedure Laterality Date   TUBAL LIGATION     Family History:  Family History  Problem Relation Age of Onset   Breast cancer Maternal Grandmother    Family Psychiatric  History: See h&P Social History:  Social History   Substance and Sexual Activity  Alcohol Use Yes     Social History   Substance and Sexual Activity  Drug Use Not Currently    Social History   Socioeconomic History   Marital status: Divorced    Spouse name: Not on file   Number of children: Not on file   Years of education: Not on file   Highest education level: Not on file  Occupational History   Not on file  Tobacco Use   Smoking status: Every Day    Packs/day: 1.00    Types: Cigars, Cigarettes   Smokeless tobacco: Current  Vaping Use   Vaping Use: Never used  Substance and Sexual Activity   Alcohol use: Yes   Drug use: Not Currently   Sexual activity: Not Currently    Birth control/protection: Surgical  Other Topics Concern   Not on file  Social History Narrative   Not on file   Social Determinants of Health  Financial Resource Strain: Not on file  Food Insecurity: Not on file  Transportation Needs: Not on file  Physical Activity: Not on file  Stress: Not on file  Social Connections: Not on file   SDOH:  SDOH Screenings   Depression (PHQ2-9): Low Risk  (11/23/2021)  Tobacco Use: High Risk (01/26/2022)   Additional Social History:                         Sleep: Good  Appetite:  Good  Current Medications:  Current Facility-Administered Medications  Medication Dose Route Frequency Provider Last Rate Last Admin   acetaminophen (TYLENOL) tablet 650 mg  650 mg Oral  Q6H PRN Revonda Humphrey, NP   650 mg at 02/02/22 1933   albuterol (VENTOLIN HFA) 108 (90 Base) MCG/ACT inhaler 2 puff  2 puff Inhalation Q6H PRN Revonda Humphrey, NP       alum & mag hydroxide-simeth (MAALOX/MYLANTA) 200-200-20 MG/5ML suspension 30 mL  30 mL Oral Q4H PRN Revonda Humphrey, NP       apixaban Arne Cleveland) tablet 5 mg  5 mg Oral BID Revonda Humphrey, NP   5 mg at 02/02/22 2113   ARIPiprazole (ABILIFY) tablet 10 mg  10 mg Oral Daily Revonda Humphrey, NP   10 mg at 02/02/22 0915   cloZAPine (CLOZARIL) tablet 50 mg  50 mg Oral Daily Revonda Humphrey, NP   50 mg at 02/02/22 0915   cloZAPine (CLOZARIL) tablet 75 mg  75 mg Oral QHS Revonda Humphrey, NP   75 mg at 02/02/22 2113   diltiazem (CARDIZEM CD) 24 hr capsule 240 mg  240 mg Oral Daily Revonda Humphrey, NP   240 mg at 02/02/22 0915   divalproex (DEPAKOTE ER) 24 hr tablet 500 mg  500 mg Oral BID Revonda Humphrey, NP   500 mg at 02/02/22 2113   fluticasone furoate-vilanterol (BREO ELLIPTA) 200-25 MCG/ACT 1 puff  1 puff Inhalation Daily Revonda Humphrey, NP   1 puff at 02/02/22 0746   haloperidol (HALDOL) tablet 10 mg  10 mg Oral TID PRN Revonda Humphrey, NP       hydrOXYzine (ATARAX) tablet 25 mg  25 mg Oral TID PRN Revonda Humphrey, NP   25 mg at 02/01/22 2134   insulin aspart (novoLOG) injection 0-9 Units  0-9 Units Subcutaneous TID WC Revonda Humphrey, NP   1 Units at 02/02/22 0746   insulin glargine-yfgn (SEMGLEE) injection 12 Units  12 Units Subcutaneous Q1200 Revonda Humphrey, NP   12 Units at 02/02/22 1206   latanoprost (XALATAN) 0.005 % ophthalmic solution 1 drop  1 drop Both Eyes QHS Thomes Lolling H, NP   1 drop at 02/02/22 2114   magnesium hydroxide (MILK OF MAGNESIA) suspension 30 mL  30 mL Oral Daily PRN Revonda Humphrey, NP       menthol-cetylpyridinium (CEPACOL) lozenge 3 mg  1 lozenge Oral TID PRN Onuoha, Chinwendu V, NP   3 mg at 02/02/22 1933   metFORMIN (GLUCOPHAGE-XR) 24 hr tablet 1,000 mg   1,000 mg Oral BID WC Revonda Humphrey, NP   1,000 mg at 02/02/22 1647   nicotine (NICODERM CQ - dosed in mg/24 hr) patch 7 mg  7 mg Transdermal Daily Revonda Humphrey, NP   7 mg at 02/02/22 0915   traZODone (DESYREL) tablet 50 mg  50 mg Oral QHS PRN Revonda Humphrey, NP   50 mg  at 02/01/22 2134   valbenazine (INGREZZA) capsule 40 mg  40 mg Oral q AM Revonda Humphrey, NP   40 mg at 02/02/22 8366   Vitamin D (Ergocalciferol) (DRISDOL) 1.25 MG (50000 UNIT) capsule 50,000 Units  50,000 Units Oral Q Sharren Bridge, NP   50,000 Units at 01/28/22 2947   Current Outpatient Medications  Medication Sig Dispense Refill   Accu-Chek Softclix Lancets lancets Use as directed up to four times daily 100 each 0   apixaban (ELIQUIS) 5 MG TABS tablet Take 1 tablet (5 mg total) by mouth 2 (two) times daily. 60 tablet 0   ARIPiprazole (ABILIFY) 10 MG tablet Take 1 tablet (10 mg total) by mouth daily. 30 tablet 0   Blood Glucose Monitoring Suppl (ACCU-CHEK GUIDE) w/Device KIT Use as directed up to four times daily 1 kit 0   budesonide-formoterol (SYMBICORT) 160-4.5 MCG/ACT inhaler Inhale 2 puffs into the lungs in the morning and at bedtime.     Cholecalciferol (VITAMIN D3) 1.25 MG (50000 UT) CAPS Take 50,000 Units by mouth every Thursday.     cloZAPine (CLOZARIL) 25 MG tablet Take 3 tablets (75 mg total) by mouth at bedtime. 90 tablet 0   clozapine (CLOZARIL) 50 MG tablet Take 1 tablet (50 mg total) by mouth daily. 30 tablet 0   diltiazem (CARDIZEM CD) 240 MG 24 hr capsule Take 1 capsule (240 mg total) by mouth daily. (Patient not taking: Reported on 11/24/2021) 30 capsule 0   divalproex (DEPAKOTE ER) 500 MG 24 hr tablet Take 1 tablet (500 mg total) by mouth 2 (two) times daily. 60 tablet 0   glucose blood test strip Use as directed up to four times daily 50 each 0   haloperidol (HALDOL) 10 MG tablet Take 1 tablet (10 mg total) by mouth 3 (three) times daily as needed for agitation (and psychotic  symptoms).     INGREZZA 40 MG capsule Take 1 capsule (40 mg total) by mouth in the morning. 30 capsule 0   insulin aspart (NOVOLOG) 100 UNIT/ML FlexPen Before each meal 3 times a day, 140-199 - 2 units, 200-250 - 4 units, 251-299 - 6 units,  300-349 - 8 units,  350 or above 10 units. Insulin PEN if approved, provide syringes and needles if needed.Please switch to any approved short acting Insulin if needed. 15 mL 0   insulin glargine (LANTUS) 100 UNIT/ML Solostar Pen Inject 12 Units into the skin daily. 15 mL 0   Insulin Pen Needle 32G X 4 MM MISC Use 4 times a day with insulin, 1 month supply. 100 each 0   latanoprost (XALATAN) 0.005 % ophthalmic solution Place 1 drop into both eyes at bedtime.     metFORMIN (GLUCOPHAGE) 500 MG tablet Take 500 mg by mouth 2 (two) times daily with a meal.     nicotine (NICODERM CQ - DOSED IN MG/24 HR) 7 mg/24hr patch Place 1 patch (7 mg total) onto the skin daily. 28 patch 0   PROAIR HFA 108 (90 Base) MCG/ACT inhaler Inhale 2 puffs into the lungs every 6 (six) hours as needed for wheezing or shortness of breath.      Labs  Lab Results:  Admission on 01/24/2022  Component Date Value Ref Range Status   Glucose-Capillary 01/24/2022 143 (H)  70 - 99 mg/dL Final   Glucose reference range applies only to samples taken after fasting for at least 8 hours.   Glucose-Capillary 01/24/2022 120 (H)  70 - 99 mg/dL Final  Glucose reference range applies only to samples taken after fasting for at least 8 hours.   Glucose-Capillary 01/25/2022 122 (H)  70 - 99 mg/dL Final   Glucose reference range applies only to samples taken after fasting for at least 8 hours.   Glucose-Capillary 01/25/2022 190 (H)  70 - 99 mg/dL Final   Glucose reference range applies only to samples taken after fasting for at least 8 hours.   Glucose-Capillary 01/25/2022 163 (H)  70 - 99 mg/dL Final   Glucose reference range applies only to samples taken after fasting for at least 8 hours.   WBC  01/26/2022 6.5  4.0 - 10.5 K/uL Final   RBC 01/26/2022 4.53  3.87 - 5.11 MIL/uL Final   Hemoglobin 01/26/2022 11.3 (L)  12.0 - 15.0 g/dL Final   HCT 01/26/2022 36.4  36.0 - 46.0 % Final   MCV 01/26/2022 80.4  80.0 - 100.0 fL Final   MCH 01/26/2022 24.9 (L)  26.0 - 34.0 pg Final   MCHC 01/26/2022 31.0  30.0 - 36.0 g/dL Final   RDW 01/26/2022 17.3 (H)  11.5 - 15.5 % Final   Platelets 01/26/2022 327  150 - 400 K/uL Final   nRBC 01/26/2022 0.0  0.0 - 0.2 % Final   Neutrophils Relative % 01/26/2022 60  % Final   Neutro Abs 01/26/2022 3.9  1.7 - 7.7 K/uL Final   Lymphocytes Relative 01/26/2022 31  % Final   Lymphs Abs 01/26/2022 2.0  0.7 - 4.0 K/uL Final   Monocytes Relative 01/26/2022 7  % Final   Monocytes Absolute 01/26/2022 0.5  0.1 - 1.0 K/uL Final   Eosinophils Relative 01/26/2022 1  % Final   Eosinophils Absolute 01/26/2022 0.1  0.0 - 0.5 K/uL Final   Basophils Relative 01/26/2022 1  % Final   Basophils Absolute 01/26/2022 0.1  0.0 - 0.1 K/uL Final   Immature Granulocytes 01/26/2022 0  % Final   Abs Immature Granulocytes 01/26/2022 0.02  0.00 - 0.07 K/uL Final   Performed at [facility], [place] [address], [place], [place] [address]   Glucose-Capillary 01/26/2022 149 (H)  70 - 99 mg/dL Final   Glucose reference range applies only to samples taken after fasting for at least 8 hours.   Glucose-Capillary 01/26/2022 130 (H)  70 - 99 mg/dL Final   Glucose reference range applies only to samples taken after fasting for at least 8 hours.   Glucose-Capillary 01/26/2022 151 (H)  70 - 99 mg/dL Final   Glucose reference range applies only to samples taken after fasting for at least 8 hours.   Glucose-Capillary 01/26/2022 170 (H)  70 - 99 mg/dL Final   Glucose reference range applies only to samples taken after fasting for at least 8 hours.   Glucose-Capillary 01/27/2022 129 (H)  70 - 99 mg/dL Final   Glucose reference range applies only to samples taken after fasting for at least 8 hours.    Glucose-Capillary 01/27/2022 101 (H)  70 - 99 mg/dL Final   Glucose reference range applies only to samples taken after fasting for at least 8 hours.   Glucose-Capillary 01/27/2022 220 (H)  70 - 99 mg/dL Final   Glucose reference range applies only to samples taken after fasting for at least 8 hours.   Glucose-Capillary 01/27/2022 154 (H)  70 - 99 mg/dL Final   Glucose reference range applies only to samples taken after fasting for at least 8 hours.   Glucose-Capillary 01/27/2022 163 (H)  70 -  99 mg/dL Final   Glucose reference range applies only to samples taken after fasting for at least 8 hours.   Glucose-Capillary 01/28/2022 127 (H)  70 - 99 mg/dL Final   Glucose reference range applies only to samples taken after fasting for at least 8 hours.   Glucose-Capillary 01/28/2022 110 (H)  70 - 99 mg/dL Final   Glucose reference range applies only to samples taken after fasting for at least 8 hours.   Glucose-Capillary 01/28/2022 167 (H)  70 - 99 mg/dL Final   Glucose reference range applies only to samples taken after fasting for at least 8 hours.   Glucose-Capillary 01/29/2022 145 (H)  70 - 99 mg/dL Final   Glucose reference range applies only to samples taken after fasting for at least 8 hours.   Glucose-Capillary 01/29/2022 203 (H)  70 - 99 mg/dL Final   Glucose reference range applies only to samples taken after fasting for at least 8 hours.   Glucose-Capillary 01/29/2022 153 (H)  70 - 99 mg/dL Final   Glucose reference range applies only to samples taken after fasting for at least 8 hours.   Glucose-Capillary 01/29/2022 166 (H)  70 - 99 mg/dL Final   Glucose reference range applies only to samples taken after fasting for at least 8 hours.   Glucose-Capillary 01/30/2022 142 (H)  70 - 99 mg/dL Final   Glucose reference range applies only to samples taken after fasting for at least 8 hours.   Glucose-Capillary 01/30/2022 138 (H)  70 - 99 mg/dL Final   Glucose reference range applies only  to samples taken after fasting for at least 8 hours.   Glucose-Capillary 01/30/2022 185 (H)  70 - 99 mg/dL Final   Glucose reference range applies only to samples taken after fasting for at least 8 hours.   Glucose-Capillary 01/30/2022 220 (H)  70 - 99 mg/dL Final   Glucose reference range applies only to samples taken after fasting for at least 8 hours.   Glucose-Capillary 01/31/2022 147 (H)  70 - 99 mg/dL Final   Glucose reference range applies only to samples taken after fasting for at least 8 hours.   Glucose-Capillary 01/31/2022 131 (H)  70 - 99 mg/dL Final   Glucose reference range applies only to samples taken after fasting for at least 8 hours.   Glucose-Capillary 01/31/2022 169 (H)  70 - 99 mg/dL Final   Glucose reference range applies only to samples taken after fasting for at least 8 hours.   Glucose-Capillary 01/31/2022 144 (H)  70 - 99 mg/dL Final   Glucose reference range applies only to samples taken after fasting for at least 8 hours.   Comment 1 01/31/2022 RN AWARE   Final   Glucose-Capillary 02/01/2022 117 (H)  70 - 99 mg/dL Final   Glucose reference range applies only to samples taken after fasting for at least 8 hours.   Glucose-Capillary 02/01/2022 180 (H)  70 - 99 mg/dL Final   Glucose reference range applies only to samples taken after fasting for at least 8 hours.   Glucose-Capillary 02/01/2022 209 (H)  70 - 99 mg/dL Final   Glucose reference range applies only to samples taken after fasting for at least 8 hours.   Glucose-Capillary 02/02/2022 131 (H)  70 - 99 mg/dL Final   Glucose reference range applies only to samples taken after fasting for at least 8 hours.   WBC 02/02/2022 7.5  4.0 - 10.5 K/uL Final   RBC 02/02/2022 4.52  3.87 -  5.11 MIL/uL Final   Hemoglobin 02/02/2022 11.3 (L)  12.0 - 15.0 g/dL Final   HCT 02/02/2022 35.0 (L)  36.0 - 46.0 % Final   MCV 02/02/2022 77.4 (L)  80.0 - 100.0 fL Final   MCH 02/02/2022 25.0 (L)  26.0 - 34.0 pg Final   MCHC  02/02/2022 32.3  30.0 - 36.0 g/dL Final   RDW 02/02/2022 16.7 (H)  11.5 - 15.5 % Final   Platelets 02/02/2022 345  150 - 400 K/uL Final   nRBC 02/02/2022 0.0  0.0 - 0.2 % Final   Neutrophils Relative % 02/02/2022 63  % Final   Neutro Abs 02/02/2022 4.7  1.7 - 7.7 K/uL Final   Lymphocytes Relative 02/02/2022 28  % Final   Lymphs Abs 02/02/2022 2.1  0.7 - 4.0 K/uL Final   Monocytes Relative 02/02/2022 7  % Final   Monocytes Absolute 02/02/2022 0.5  0.1 - 1.0 K/uL Final   Eosinophils Relative 02/02/2022 1  % Final   Eosinophils Absolute 02/02/2022 0.1  0.0 - 0.5 K/uL Final   Basophils Relative 02/02/2022 1  % Final   Basophils Absolute 02/02/2022 0.1  0.0 - 0.1 K/uL Final   Immature Granulocytes 02/02/2022 0  % Final   Abs Immature Granulocytes 02/02/2022 0.03  0.00 - 0.07 K/uL Final   Performed at [facility], [place] [address], [place], [place] [address]   Glucose-Capillary 02/02/2022 95  70 - 99 mg/dL Final   Glucose reference range applies only to samples taken after fasting for at least 8 hours.   Glucose-Capillary 02/02/2022 120 (H)  70 - 99 mg/dL Final   Glucose reference range applies only to samples taken after fasting for at least 8 hours.   Glucose-Capillary 02/02/2022 191 (H)  70 - 99 mg/dL Final   Glucose reference range applies only to samples taken after fasting for at least 8 hours.  Admission on 01/23/2022, Discharged on 01/24/2022  Component Date Value Ref Range Status   Sodium 01/23/2022 140  135 - 145 mmol/L Final   Potassium 01/23/2022 4.4  3.5 - 5.1 mmol/L Final   Chloride 01/23/2022 101  98 - 111 mmol/L Final   CO2 01/23/2022 25  22 - 32 mmol/L Final   Glucose, Bld 01/23/2022 198 (H)  70 - 99 mg/dL Final   Glucose reference range applies only to samples taken after fasting for at least 8 hours.   BUN 01/23/2022 13  6 - 20 mg/dL Final   Creatinine, Ser 01/23/2022 0.62  0.44 - 1.00 mg/dL Final   Calcium 01/23/2022 9.3  8.9 - 10.3 mg/dL Final   GFR, Estimated  01/23/2022 >60  >60 mL/min Final   Comment: (NOTE) Calculated using the CKD-EPI Creatinine Equation (2021)    Anion gap 01/23/2022 14  5 - 15 Final   Performed at [facility], [place] [address], [place], [place] [address]   WBC 01/23/2022 5.8  4.0 - 10.5 K/uL Final   RBC 01/23/2022 4.63  3.87 - 5.11 MIL/uL Final   Hemoglobin 01/23/2022 11.7 (L)  12.0 - 15.0 g/dL Final   HCT 01/23/2022 37.4  36.0 - 46.0 % Final   MCV 01/23/2022 80.8  80.0 - 100.0 fL Final   MCH 01/23/2022 25.3 (L)  26.0 - 34.0 pg Final   MCHC 01/23/2022 31.3  30.0 - 36.0 g/dL Final   RDW 01/23/2022 17.9 (H)  11.5 - 15.5 % Final   Platelets 01/23/2022 333  150 - 400 K/uL Final  nRBC 01/23/2022 0.0  0.0 - 0.2 % Final   Performed at [facility], [place] [address] [place], [place] [address]   Troponin I (High Sensitivity) 01/23/2022 6  <18 ng/L Final   Comment: (NOTE) Elevated high sensitivity troponin I (hsTnI) values and significant  changes across serial measurements may suggest ACS but many other  chronic and acute conditions are known to elevate hsTnI results.  Refer to the "Links" section for chest pain algorithms and additional  guidance. Performed at [facility], [place] [address], [place], [place] [address]    Troponin I (High Sensitivity) 01/23/2022 5  <18 ng/L Final   Comment: (NOTE) Elevated high sensitivity troponin I (hsTnI) values and significant  changes across serial measurements may suggest ACS but many other  chronic and acute conditions are known to elevate hsTnI results.  Refer to the "Links" section for chest pain algorithms and additional  guidance. Performed at [facility], [place] [address] [place], [place] [address]    SARS Coronavirus 2 by RT PCR 01/23/2022 NEGATIVE  NEGATIVE Final   Comment: (NOTE) SARS-CoV-2 target nucleic acids are NOT DETECTED.  The SARS-CoV-2 RNA is generally detectable in upper and lower respiratory specimens during the acute phase of infection.  The lowest concentration of SARS-CoV-2 viral copies this assay can detect is 250 copies / mL. A negative result does not preclude SARS-CoV-2 infection and should not be used as the sole basis for treatment or other patient management decisions.  A negative result may occur with improper specimen collection / handling, submission of specimen other than nasopharyngeal swab, presence of viral mutation(s) within the areas targeted by this assay, and inadequate number of viral copies (<250 copies / mL). A negative result must be combined with clinical observations, patient history, and epidemiological information.  Fact Sheet for Patients:   https://www.patel.info/  Fact Sheet for Healthcare Providers: https://hall.com/  This test is not yet approved or                           cleared by the [place] FDA and has been authorized for detection and/or diagnosis of SARS-CoV-2 by FDA under an Emergency Use Authorization (EUA).  This EUA will remain in effect (meaning this test can be used) for the duration of the COVID-19 declaration under Section 564(b)(1) of the Act, 21 U.S.C. section 360bbb-3(b)(1), unless the authorization is terminated or revoked sooner.  Performed at [facility], [place] [address] [place], [place] [address]    B Natriuretic Peptide 01/23/2022 41.3  0.0 - 100.0 pg/mL Final   Performed at [place] [address] [place], [place] [address]   Group A Strep by PCR 01/23/2022 NOT DETECTED  NOT DETECTED Final   Performed at [facility], [place] [address], [place], [place] [address]   Color, Urine 01/23/2022 YELLOW  YELLOW Final   APPearance 01/23/2022 CLEAR  CLEAR Final   Specific Gravity, Urine 01/23/2022 1.018  1.005 - 1.030 Final   pH 01/23/2022 7.0  5.0 - 8.0 Final   Glucose, UA 01/23/2022 NEGATIVE  NEGATIVE mg/dL Final   Hgb urine dipstick 01/23/2022 NEGATIVE  NEGATIVE Final   Bilirubin Urine  01/23/2022 NEGATIVE  NEGATIVE Final   Ketones, ur 01/23/2022 NEGATIVE  NEGATIVE mg/dL Final   Protein, ur 01/23/2022 NEGATIVE  NEGATIVE mg/dL Final   Nitrite 01/23/2022 NEGATIVE  NEGATIVE Final   Leukocytes,Ua 01/23/2022 TRACE (A)  NEGATIVE  Final   RBC / HPF 01/23/2022 0-5  0 - 5 RBC/hpf Final   WBC, UA 01/23/2022 0-5  0 - 5 WBC/hpf Final   Bacteria, UA 01/23/2022 NONE SEEN  NONE SEEN Final   Squamous Epithelial / LPF 01/23/2022 0-5  0 - 5 Final   Mucus 01/23/2022 PRESENT   Final   Performed at [facility], [place] [address] [place], [place] [address]   SARS Coronavirus 2 by RT PCR 01/23/2022 NEGATIVE  NEGATIVE Final   Comment: (NOTE) SARS-CoV-2 target nucleic acids are NOT DETECTED.  The SARS-CoV-2 RNA is generally detectable in upper respiratory specimens during the acute phase of infection. The lowest concentration of SARS-CoV-2 viral copies this assay can detect is 138 copies/mL. A negative result does not preclude SARS-Cov-2 infection and should not be used as the sole basis for treatment or other patient management decisions. A negative result may occur with  improper specimen collection/handling, submission of specimen other than nasopharyngeal swab, presence of viral mutation(s) within the areas targeted by this assay, and inadequate number of viral copies(<138 copies/mL). A negative result must be combined with clinical observations, patient history, and epidemiological information. The expected result is Negative.  Fact Sheet for Patients:  EntrepreneurPulse.com.au  Fact Sheet for Healthcare Providers:  IncredibleEmployment.be  This test is no                          t yet approved or cleared by the [place] FDA and  has been authorized for detection and/or diagnosis of SARS-CoV-2 by FDA under an Emergency Use Authorization (EUA). This EUA will remain  in effect (meaning this test can be used) for the duration of  the COVID-19 declaration under Section 564(b)(1) of the Act, 21 U.S.C.section 360bbb-3(b)(1), unless the authorization is terminated  or revoked sooner.       Influenza A by PCR 01/23/2022 NEGATIVE  NEGATIVE Final   Influenza B by PCR 01/23/2022 NEGATIVE  NEGATIVE Final   Comment: (NOTE) The Xpert Xpress SARS-CoV-2/FLU/RSV plus assay is intended as an aid in the diagnosis of influenza from Nasopharyngeal swab specimens and should not be used as a sole basis for treatment. Nasal washings and aspirates are unacceptable for Xpert Xpress SARS-CoV-2/FLU/RSV testing.  Fact Sheet for Patients: EntrepreneurPulse.com.au  Fact Sheet for Healthcare Providers: IncredibleEmployment.be  This test is not yet approved or cleared by the [place] FDA and has been authorized for detection and/or diagnosis of SARS-CoV-2 by FDA under an Emergency Use Authorization (EUA). This EUA will remain in effect (meaning this test can be used) for the duration of the COVID-19 declaration under Section 564(b)(1) of the Act, 21 U.S.C. section 360bbb-3(b)(1), unless the authorization is terminated or revoked.  Performed at [facility], [place] [address] [place], [place] [address]    WBC 01/24/2022 6.1  4.0 - 10.5 K/uL Final   RBC 01/24/2022 4.60  3.87 - 5.11 MIL/uL Final   Hemoglobin 01/24/2022 11.7 (L)  12.0 - 15.0 g/dL Final   HCT 01/24/2022 37.0  36.0 - 46.0 % Final   MCV 01/24/2022 80.4  80.0 - 100.0 fL Final   MCH 01/24/2022 25.4 (L)  26.0 - 34.0 pg Final   MCHC 01/24/2022 31.6  30.0 - 36.0 g/dL Final   RDW 01/24/2022 17.6 (H)  11.5 - 15.5 % Final   Platelets 01/24/2022 334  150 - 400 K/uL Final   nRBC 01/24/2022 0.0  0.0 - 0.2 % Final  Neutrophils Relative % 01/24/2022 53  % Final   Neutro Abs 01/24/2022 3.3  1.7 - 7.7 K/uL Final   Lymphocytes Relative 01/24/2022 33  % Final   Lymphs Abs 01/24/2022 2.0  0.7 - 4.0 K/uL Final   Monocytes Relative  01/24/2022 11  % Final   Monocytes Absolute 01/24/2022 0.7  0.1 - 1.0 K/uL Final   Eosinophils Relative 01/24/2022 2  % Final   Eosinophils Absolute 01/24/2022 0.1  0.0 - 0.5 K/uL Final   Basophils Relative 01/24/2022 1  % Final   Basophils Absolute 01/24/2022 0.1  0.0 - 0.1 K/uL Final   Immature Granulocytes 01/24/2022 0  % Final   Abs Immature Granulocytes 01/24/2022 0.02  0.00 - 0.07 K/uL Final   Performed at [facility], [place] [address] [place], [place] [address]   Glucose-Capillary 01/24/2022 110 (H)  70 - 99 mg/dL Final   Glucose reference range applies only to samples taken after fasting for at least 8 hours.  No results displayed because visit has over 200 results.    Admission on 11/22/2021, Discharged on 11/22/2021  Component Date Value Ref Range Status   Glucose-Capillary 11/22/2021 169 (H)  70 - 99 mg/dL Final   Glucose reference range applies only to samples taken after fasting for at least 8 hours.  No results displayed because visit has over 200 results.    Admission on 10/04/2021, Discharged on 10/22/2021  Component Date Value Ref Range Status   SARS Coronavirus 2 by RT PCR 10/04/2021 NEGATIVE  NEGATIVE Final   Comment: (NOTE) SARS-CoV-2 target nucleic acids are NOT DETECTED.  The SARS-CoV-2 RNA is generally detectable in upper respiratory specimens during the acute phase of infection. The lowest concentration of SARS-CoV-2 viral copies this assay can detect is 138 copies/mL. A negative result does not preclude SARS-Cov-2 infection and should not be used as the sole basis for treatment or other patient management decisions. A negative result may occur with  improper specimen collection/handling, submission of specimen other than nasopharyngeal swab, presence of viral mutation(s) within the areas targeted by this assay, and inadequate number of viral copies(<138 copies/mL). A negative result must be combined with clinical observations, patient history, and  epidemiological information. The expected result is Negative.  Fact Sheet for Patients:  EntrepreneurPulse.com.au  Fact Sheet for Healthcare Providers:  IncredibleEmployment.be  This test is no                          t yet approved or cleared by the [place] FDA and  has been authorized for detection and/or diagnosis of SARS-CoV-2 by FDA under an Emergency Use Authorization (EUA). This EUA will remain  in effect (meaning this test can be used) for the duration of the COVID-19 declaration under Section 564(b)(1) of the Act, 21 U.S.C.section 360bbb-3(b)(1), unless the authorization is terminated  or revoked sooner.       Influenza A by PCR 10/04/2021 NEGATIVE  NEGATIVE Final   Influenza B by PCR 10/04/2021 NEGATIVE  NEGATIVE Final   Comment: (NOTE) The Xpert Xpress SARS-CoV-2/FLU/RSV plus assay is intended as an aid in the diagnosis of influenza from Nasopharyngeal swab specimens and should not be used as a sole basis for treatment. Nasal washings and aspirates are unacceptable for Xpert Xpress SARS-CoV-2/FLU/RSV testing.  Fact Sheet for Patients: EntrepreneurPulse.com.au  Fact Sheet for Healthcare Providers: IncredibleEmployment.be  This test is not yet approved or cleared by the [place] FDA and has been  authorized for detection and/or diagnosis of SARS-CoV-2 by FDA under an Emergency Use Authorization (EUA). This EUA will remain in effect (meaning this test can be used) for the duration of the COVID-19 declaration under Section 564(b)(1) of the Act, 21 U.S.C. section 360bbb-3(b)(1), unless the authorization is terminated or revoked.  Performed at [facility], [place] [address] [place], [place] [address]    WBC 10/04/2021 8.4  4.0 - 10.5 K/uL Final   RBC 10/04/2021 4.42  3.87 - 5.11 MIL/uL Final   Hemoglobin 10/04/2021 11.6 (L)  12.0 - 15.0 g/dL Final   HCT 10/04/2021 35.7 (L)  36.0  - 46.0 % Final   MCV 10/04/2021 80.8  80.0 - 100.0 fL Final   MCH 10/04/2021 26.2  26.0 - 34.0 pg Final   MCHC 10/04/2021 32.5  30.0 - 36.0 g/dL Final   RDW 10/04/2021 16.0 (H)  11.5 - 15.5 % Final   Platelets 10/04/2021 372  150 - 400 K/uL Final   nRBC 10/04/2021 0.0  0.0 - 0.2 % Final   Neutrophils Relative % 10/04/2021 68  % Final   Neutro Abs 10/04/2021 5.7  1.7 - 7.7 K/uL Final   Lymphocytes Relative 10/04/2021 22  % Final   Lymphs Abs 10/04/2021 1.8  0.7 - 4.0 K/uL Final   Monocytes Relative 10/04/2021 8  % Final   Monocytes Absolute 10/04/2021 0.7  0.1 - 1.0 K/uL Final   Eosinophils Relative 10/04/2021 1  % Final   Eosinophils Absolute 10/04/2021 0.1  0.0 - 0.5 K/uL Final   Basophils Relative 10/04/2021 1  % Final   Basophils Absolute 10/04/2021 0.1  0.0 - 0.1 K/uL Final   Immature Granulocytes 10/04/2021 0  % Final   Abs Immature Granulocytes 10/04/2021 0.03  0.00 - 0.07 K/uL Final   Performed at [facility], [place] [address] [place], [place] [address]   Sodium 10/04/2021 136  135 - 145 mmol/L Final   Potassium 10/04/2021 4.2  3.5 - 5.1 mmol/L Final   Chloride 10/04/2021 104  98 - 111 mmol/L Final   CO2 10/04/2021 25  22 - 32 mmol/L Final   Glucose, Bld 10/04/2021 100 (H)  70 - 99 mg/dL Final   Glucose reference range applies only to samples taken after fasting for at least 8 hours.   BUN 10/04/2021 9  6 - 20 mg/dL Final   Creatinine, Ser 10/04/2021 0.52  0.44 - 1.00 mg/dL Final   Calcium 10/04/2021 9.0  8.9 - 10.3 mg/dL Final   Total Protein 10/04/2021 7.0  6.5 - 8.1 g/dL Final   Albumin 10/04/2021 3.2 (L)  3.5 - 5.0 g/dL Final   AST 10/04/2021 13 (L)  15 - 41 U/L Final   ALT 10/04/2021 10  0 - 44 U/L Final   Alkaline Phosphatase 10/04/2021 61  38 - 126 U/L Final   Total Bilirubin 10/04/2021 0.3  0.3 - 1.2 mg/dL Final   GFR, Estimated 10/04/2021 >60  >60 mL/min Final   Comment: (NOTE) Calculated using the CKD-EPI Creatinine Equation (2021)    Anion gap 10/04/2021  7  5 - 15 Final   Performed at [facility] [address], [place], [place] [address]   Hgb A1c MFr Bld 10/04/2021 7.0 (H)  4.8 - 5.6 % Final   Comment: (NOTE) Pre diabetes:          5.7%-6.4%  Diabetes:              >6.4%  Glycemic  control for   <7.0% adults with diabetes    Mean Plasma Glucose 10/04/2021 154.2  mg/dL Final   Performed at [facility] [address], [place], [place] [address]   Cholesterol 10/04/2021 178  0 - 200 mg/dL Final   Triglycerides 10/04/2021 155 (H)  <150 mg/dL Final   HDL 10/04/2021 52  >40 mg/dL Final   Total CHOL/HDL Ratio 10/04/2021 3.4  RATIO Final   VLDL 10/04/2021 31  0 - 40 mg/dL Final   LDL Cholesterol 10/04/2021 95  0 - 99 mg/dL Final   Comment:        Total Cholesterol/HDL:CHD Risk Coronary Heart Disease Risk Table                     Men   Women  1/2 Average Risk   3.4   3.3  Average Risk       5.0   4.4  2 X Average Risk   9.6   7.1  3 X Average Risk  23.4   11.0        Use the calculated Patient Ratio above and the CHD Risk Table to determine the patient's CHD Risk.        ATP III CLASSIFICATION (LDL):  <100     mg/dL   Optimal  100-129  mg/dL   Near or Above                    Optimal  130-159  mg/dL   Borderline  160-189  mg/dL   High  >190     mg/dL   Very High Performed at [facility] [address] [place], [place] [address]    POC Amphetamine UR 10/04/2021 None Detected  NONE DETECTED (Cut Off Level 1000 ng/mL) Final   POC Secobarbital (BAR) 10/04/2021 None Detected  NONE DETECTED (Cut Off Level 300 ng/mL) Final   POC Buprenorphine (BUP) 10/04/2021 None Detected  NONE DETECTED (Cut Off Level 10 ng/mL) Final   POC Oxazepam (BZO) 10/04/2021 None Detected  NONE DETECTED (Cut Off Level 300 ng/mL) Final   POC Cocaine UR 10/04/2021 None Detected  NONE DETECTED (Cut Off Level 300 ng/mL) Final   POC Methamphetamine UR 10/04/2021 None Detected  NONE DETECTED (Cut Off Level 1000 ng/mL) Final   POC Morphine  10/04/2021 None Detected  NONE DETECTED (Cut Off Level 300 ng/mL) Final   POC Methadone UR 10/04/2021 None Detected  NONE DETECTED (Cut Off Level 300 ng/mL) Final   POC Oxycodone UR 10/04/2021 Positive (A)  NONE DETECTED (Cut Off Level 100 ng/mL) Final   POC Marijuana UR 10/04/2021 None Detected  NONE DETECTED (Cut Off Level 50 ng/mL) Final   SARSCOV2ONAVIRUS 2 AG 10/04/2021 NEGATIVE  NEGATIVE Final   Comment: (NOTE) SARS-CoV-2 antigen NOT DETECTED.   Negative results are presumptive.  Negative results do not preclude SARS-CoV-2 infection and should not be used as the sole basis for treatment or other patient management decisions, including infection  control decisions, particularly in the presence of clinical signs and  symptoms consistent with COVID-19, or in those who have been in contact with the virus.  Negative results must be combined with clinical observations, patient history, and epidemiological information. The expected result is Negative.  Fact Sheet for Patients: HandmadeRecipes.com.cy  Fact Sheet for Healthcare Providers: FuneralLife.at  This test is not yet approved or cleared by the [place] FDA and  has been authorized for detection and/or diagnosis of SARS-CoV-2  by FDA under an Emergency Use Authorization (EUA).  This EUA will remain in effect (meaning this test can be used) for the duration of  the COV                          ID-19 declaration under Section 564(b)(1) of the Act, 21 U.S.C. section 360bbb-3(b)(1), unless the authorization is terminated or revoked sooner.     Valproic Acid Lvl 10/04/2021 51  50.0 - 100.0 ug/mL Final   Performed at [facility] [address] [place], [place] [address]   Valproic Acid Lvl 10/08/2021 57  50.0 - 100.0 ug/mL Final   Performed at [facility] [address], [place], [place] [address]   Glucose-Capillary 10/09/2021 123 (H)  70 - 99 mg/dL Final   Glucose  reference range applies only to samples taken after fasting for at least 8 hours.  Admission on 09/09/2021, Discharged on 09/10/2021  Component Date Value Ref Range Status   Sodium 09/09/2021 134 (L)  135 - 145 mmol/L Final   Potassium 09/09/2021 4.3  3.5 - 5.1 mmol/L Final   Chloride 09/09/2021 99  98 - 111 mmol/L Final   CO2 09/09/2021 25  22 - 32 mmol/L Final   Glucose, Bld 09/09/2021 107 (H)  70 - 99 mg/dL Final   Glucose reference range applies only to samples taken after fasting for at least 8 hours.   BUN 09/09/2021 12  6 - 20 mg/dL Final   Creatinine, Ser 09/09/2021 0.74  0.44 - 1.00 mg/dL Final   Calcium 09/09/2021 9.0  8.9 - 10.3 mg/dL Final   Total Protein 09/09/2021 7.4  6.5 - 8.1 g/dL Final   Albumin 09/09/2021 3.1 (L)  3.5 - 5.0 g/dL Final   AST 09/09/2021 13 (L)  15 - 41 U/L Final   ALT 09/09/2021 13  0 - 44 U/L Final   Alkaline Phosphatase 09/09/2021 66  38 - 126 U/L Final   Total Bilirubin 09/09/2021 0.2 (L)  0.3 - 1.2 mg/dL Final   GFR, Estimated 09/09/2021 >60  >60 mL/min Final   Comment: (NOTE) Calculated using the CKD-EPI Creatinine Equation (2021)    Anion gap 09/09/2021 10  5 - 15 Final   Performed at [facility], [place] [address] [place], [place] [address]   WBC 09/09/2021 8.5  4.0 - 10.5 K/uL Final   RBC 09/09/2021 4.53  3.87 - 5.11 MIL/uL Final   Hemoglobin 09/09/2021 11.8 (L)  12.0 - 15.0 g/dL Final   HCT 09/09/2021 38.0  36.0 - 46.0 % Final   MCV 09/09/2021 83.9  80.0 - 100.0 fL Final   MCH 09/09/2021 26.0  26.0 - 34.0 pg Final   MCHC 09/09/2021 31.1  30.0 - 36.0 g/dL Final   RDW 09/09/2021 17.8 (H)  11.5 - 15.5 % Final   Platelets 09/09/2021 442 (H)  150 - 400 K/uL Final   nRBC 09/09/2021 0.0  0.0 - 0.2 % Final   Neutrophils Relative % 09/09/2021 70  % Final   Neutro Abs 09/09/2021 5.9  1.7 - 7.7 K/uL Final   Lymphocytes Relative 09/09/2021 20  % Final   Lymphs Abs 09/09/2021 1.7  0.7 - 4.0 K/uL Final   Monocytes Relative 09/09/2021 8  % Final    Monocytes Absolute 09/09/2021 0.7  0.1 - 1.0 K/uL Final   Eosinophils Relative 09/09/2021 1  % Final   Eosinophils Absolute 09/09/2021 0.1  0.0 -  0.5 K/uL Final   Basophils Relative 09/09/2021 1  % Final   Basophils Absolute 09/09/2021 0.1  0.0 - 0.1 K/uL Final   Immature Granulocytes 09/09/2021 0  % Final   Abs Immature Granulocytes 09/09/2021 0.03  0.00 - 0.07 K/uL Final   Performed at [facility], [place] [address] [place], [place] [address]   Ammonia 09/09/2021 37 (H)  9 - 35 umol/L Final   Comment: HEMOLYSIS AT THIS LEVEL MAY AFFECT RESULT Performed at [facility], [place] [address], [place], [place] [address]    Opiates 09/09/2021 NONE DETECTED  NONE DETECTED Final   Cocaine 09/09/2021 NONE DETECTED  NONE DETECTED Final   Benzodiazepines 09/09/2021 NONE DETECTED  NONE DETECTED Final   Amphetamines 09/09/2021 NONE DETECTED  NONE DETECTED Final   Tetrahydrocannabinol 09/09/2021 NONE DETECTED  NONE DETECTED Final   Barbiturates 09/09/2021 NONE DETECTED  NONE DETECTED Final   Comment: (NOTE) DRUG SCREEN FOR MEDICAL PURPOSES ONLY.  IF CONFIRMATION IS NEEDED FOR ANY PURPOSE, NOTIFY LAB WITHIN 5 DAYS.  LOWEST DETECTABLE LIMITS FOR URINE DRUG SCREEN Drug Class                     Cutoff (ng/mL) Amphetamine and metabolites    1000 Barbiturate and metabolites    200 Benzodiazepine                 542 Tricyclics and metabolites     300 Opiates and metabolites        300 Cocaine and metabolites        300 THC                            50 Performed at [facility], [place] [address], [place], [place] [address]    Alcohol, Ethyl (B) 09/09/2021 <10  <10 mg/dL Final   Comment: (NOTE) Lowest detectable limit for serum alcohol is 10 mg/dL.  For medical purposes only. Performed at [facility], [place] [address] [place], [place] [address]    Lipase 09/09/2021 33  11 - 51 U/L Final   Performed at [facility] [address], [place], [place] [address]    Color, Urine 09/09/2021 COLORLESS (A)  YELLOW Final   APPearance 09/09/2021 CLEAR  CLEAR Final   Specific Gravity, Urine 09/09/2021 1.002 (L)  1.005 - 1.030 Final   pH 09/09/2021 6.0  5.0 - 8.0 Final   Glucose, UA 09/09/2021 NEGATIVE  NEGATIVE mg/dL Final   Hgb urine dipstick 09/09/2021 NEGATIVE  NEGATIVE Final   Bilirubin Urine 09/09/2021 NEGATIVE  NEGATIVE Final   Ketones, ur 09/09/2021 NEGATIVE  NEGATIVE mg/dL Final   Protein, ur 09/09/2021 NEGATIVE  NEGATIVE mg/dL Final   Nitrite 09/09/2021 NEGATIVE  NEGATIVE Final   Leukocytes,Ua 09/09/2021 NEGATIVE  NEGATIVE Final   Performed at [facility] [address] [place], [place] [address]   Specimen Description 09/09/2021 URINE, CLEAN CATCH   Final   Special Requests 09/09/2021 NONE   Final   Culture 09/09/2021  (A)   Final                   Value:<10,000 COLONIES/mL INSIGNIFICANT GROWTH Performed at [facility], [place] [address] [place],  [address]    Report Status 09/09/2021 09/10/2021 FINAL   Final   D-Dimer, Quant 09/09/2021 0.92 (H)  0.00 - 0.50 ug/mL-FEU Final   Comment: (NOTE)  At the manufacturer cut-off value of 0.5 g/mL FEU, this assay has a negative predictive value of 95-100%.This assay is intended for use in conjunction with a clinical pretest probability (PTP) assessment model to exclude pulmonary embolism (PE) and deep venous thrombosis (DVT) in outpatients suspected of PE or DVT. Results should be correlated with clinical presentation. Performed at [facility], [place] [address] [place], [place] [address]    Troponin I (High Sensitivity) 09/09/2021 5  <18 ng/L Final   Comment: (NOTE) Elevated high sensitivity troponin I (hsTnI) values and significant  changes across serial measurements may suggest ACS but many other  chronic and acute conditions are known to elevate hsTnI results.  Refer to the "Links" section for chest pain algorithms and additional  guidance. Performed at [facility], [place] [address], [place], [place] [address]    BP 09/10/2021 135/83  mmHg Final   S' Lateral 09/10/2021 2.30  cm Final   Area-P 1/2 09/10/2021 5.58  cm2 Final    Blood Alcohol level:  Lab Results  Component Value Date   ETH <10 11/23/2021   ETH <10 74/09/1446    Metabolic Disorder Labs: Lab Results  Component Value Date   HGBA1C 6.7 (H) 12/28/2021   MPG 145.59 12/28/2021   MPG 154.2 10/04/2021   Lab Results  Component Value Date   PROLACTIN 3.8 (L) 11/23/2021   Lab Results  Component Value Date   CHOL 171 11/23/2021   TRIG 125 11/23/2021   HDL 65 11/23/2021   CHOLHDL 2.6 11/23/2021   VLDL 25 11/23/2021   LDLCALC 81 11/23/2021   LDLCALC 95 10/04/2021    Therapeutic Lab Levels: No results found for: "LITHIUM" Lab Results  Component Value Date   VALPROATE 33 (L) 01/18/2022   VALPROATE 33 (L) 12/28/2021   No results found for: "CBMZ"  Physical Findings   PHQ2-9    Flowsheet Row ED [facility]23[facility]2021 in [facility]  PHQ-2 Total Score 2  PHQ-9 Total Score 3      Flowsheet Row ED from 01/23/2022 in [facility] ED from [facility] ED from 11/22/2021 in [facility] DEPT  C-SSRS RISK CATEGORY No Risk No Risk No Risk        Musculoskeletal  Strength & Muscle Tone: within normal limits Gait & Station: normal Patient leans: N/A  Psychiatric Specialty Exam  Presentation  General Appearance:  Casual  Eye Contact: Good  Speech: Clear and Coherent; Normal Rate  Speech Volume: Normal  Handedness: Right   Mood and Affect  Mood: Euthymic  Affect: Congruent   Thought Process  Thought Processes: Coherent  Descriptions of Associations:Intact  Orientation:Full (Time, Place and Person)  Thought Content:Logical  Diagnosis of Schizophrenia or Schizoaffective disorder in past: No  Duration of Psychotic  Symptoms: No data recorded  Hallucinations:Hallucinations: None  Ideas of Reference:None  Suicidal Thoughts:Suicidal Thoughts: No  Homicidal Thoughts:Homicidal Thoughts: No   Sensorium  Memory: Immediate Good; Recent Good; Remote Good  Judgment: Good  Insight: Good   Executive Functions  Concentration: Good  Attention Span: Good  Recall: Good  Fund of Knowledge: Good  Language: Good   Psychomotor Activity  Psychomotor Activity: Psychomotor Activity: Normal   Assets  Assets: Communication Skills; Desire for Improvement; Financial Resources/Insurance; Physical Health; Resilience; Other (comment)   Sleep  Sleep: Sleep: Good   No data recorded  Physical Exam  Physical Exam Vitals and nursing note  reviewed.  Constitutional:      General: She is not in acute distress.    Appearance: Normal appearance.  HENT:     Head: Normocephalic.  Eyes:     General:        Right eye: No discharge.        Left eye: No discharge.     Conjunctiva/sclera: Conjunctivae normal.  Cardiovascular:     Rate and Rhythm: Normal rate.  Pulmonary:     Effort: Pulmonary effort is normal. No respiratory distress.  Musculoskeletal:        General: Normal range of motion.     Cervical back: Normal range of motion.  Skin:    General: Skin is warm and dry.  Neurological:     Mental Status: She is alert and oriented to person, place, and time.  Psychiatric:        Attention and Perception: Attention and perception normal.        Mood and Affect: Affect normal.        Speech: Speech normal.        Behavior: Behavior is cooperative.        Thought Content: Thought content normal.        Cognition and Memory: Cognition normal.        Judgment: Judgment normal.    Review of Systems  Constitutional: Negative.   HENT: Negative.    Eyes: Negative.   Respiratory: Negative.    Cardiovascular: Negative.   Musculoskeletal: Negative.   Skin: Negative.   Neurological:  Negative.   Psychiatric/Behavioral: Negative.     Blood pressure (!) 143/88, pulse (!) 118, temperature 98.7 F (37.1 C), temperature source Oral, resp. rate 18, SpO2 95 %. There is no height or weight on file to calculate BMI.   Treatment Plan Summary:   Patient remains cleared by psychiatry. TOC team seeking disposition plan.  Will continue to have daily contact with patient to assess and evaluate symptoms and progress in treatment  Revonda Humphrey, NP 02/02/2022 10:42 PM

## 2022-02-02 NOTE — ED Notes (Signed)
Pt is in the bed resting. Respirations are even and unlabored. No acute distress noted. Will continue to monitor for safety 

## 2022-02-02 NOTE — ED Notes (Signed)
Pt A&O x 4, no distress noted. Calm & cooperative, watching TV at present.  Monitoring for safety.

## 2022-02-02 NOTE — ED Notes (Signed)
Pt accepted scheduled PO meds and insulin per sliding scale w/o difficulty. Breakfast provided. Safety maintained and will continue to monitor.

## 2022-02-02 NOTE — ED Notes (Signed)
Offsite Distribution called to collect STAT specimen and to deliver to [facility]. 

## 2022-02-03 ENCOUNTER — Encounter (HOSPITAL_COMMUNITY): Payer: Self-pay | Admitting: Registered Nurse

## 2022-02-03 DIAGNOSIS — F209 Schizophrenia, unspecified: Secondary | ICD-10-CM | POA: Diagnosis not present

## 2022-02-03 LAB — GLUCOSE, CAPILLARY
Glucose-Capillary: 148 mg/dL — ABNORMAL HIGH (ref 70–99)
Glucose-Capillary: 122 mg/dL — ABNORMAL HIGH (ref 70–99)
Glucose-Capillary: 233 mg/dL — ABNORMAL HIGH (ref 70–99)

## 2022-02-03 MED ORDER — LOPERAMIDE HCL 2 MG PO CAPS
2.0000 mg | ORAL_CAPSULE | ORAL | Status: DC | PRN
  Administered 2022-02-03: 4 mg via ORAL
  Filled 2022-02-03: qty 2

## 2022-02-03 MED ORDER — DICLOFENAC SODIUM 1 % EX GEL
2.0000 g | Freq: Two times a day (BID) | CUTANEOUS | Status: DC | PRN
  Administered 2022-02-03: 2 g via TOPICAL
  Filled 2022-02-03: qty 100

## 2022-02-03 NOTE — ED Notes (Signed)
Pt C/o right thigh tightness, noted limp when ambulating.  Provider aware.  Prn medications provided.  Encouraged pt to stretch by placing a seated stretching video.  Will continue to monitor.

## 2022-02-03 NOTE — ED Notes (Signed)
Pt resting quietly, breathing is even and unlabored.  Pt denies SI, HI, pain and AVH.  Will continue to monitor for safety.  

## 2022-02-03 NOTE — ED Provider Notes (Signed)
Behavioral Health Progress Note  Date and Time: 02/03/2022 8:56 AM Name: Teresa Murillo MRN:  [id number]  Subjective:  Subjective:  Teresa Murillo 61 y.o., female patient presented Teresa Murillo 61 y.o., female patient with a past psychiatric history of schizophrenia, aggressive behavior, and borderline intellectual disability who initially presented to [facility] on 11/23/2021 via [facility].  At that time patient had been dismissed from the group home due to multiple elopements.  Patient has remained at the [facility] while awaiting new placement.  Social work and DSS are involved    Teresa Murillo, 61 y.o., female patient seen face to face by this provider, consulted with Dr. Hampton Abbot; and chart reviewed on 02/03/22.  On evaluation Teresa Murillo reports there is no changes other than the soreness that right leg.  Patient denies suicidal/self-harm/homicidal ideations, psychosis, paranoia.  Patient reports she is eating and sleeping without any difficulty.  Patient reports she continues to tolerate her medications without any adverse reaction.  Patient reports that she is aware that she is supposed to have a preliminary meeting with the group home today. During evaluation Teresa Murillo is standing with no noted distress.  She is alert, oriented x 4, calm, cooperative and attentive.  Her mood is euthymic with congruent affect.  She has normal speech, and behavior.  Objectively there is no evidence of psychosis/mania or delusional thinking.  Patient is able to converse coherently, goal directed thoughts, no distractibility, or pre-occupation.  She also denies suicidal/self-harm/homicidal ideation, psychosis, and paranoia.  Patient answered question appropriately.    [name] remains psychiatrically clear.  TOC and DSS continue to seek placement.   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 On the Wechsler Adult Intelligence Scale-4, [name] achieved a full-scale IQ score of 73 and a percentile rank of 4 placing her in the borderline range of intellectual functioning.    Chronic obstructive pulmonary disease (COPD) (HCC)    Glaucoma    Hyperlipidemia    Hypertension    Iron deficiency    Schizoaffective disorder (HCC)    Type 2 diabetes mellitus (HCC)     Past Surgical History:  Procedure Laterality Date   TUBAL LIGATION     Family History:  Family History  Problem Relation Age of Onset   Breast cancer Maternal Grandmother    Family Psychiatric  History: See H&P Social History:  Social History   Substance and Sexual Activity  Alcohol Use Yes     Social History   Substance and Sexual Activity  Drug Use Not Currently    Social History   Socioeconomic History   Marital status: Divorced    Spouse name: Not on file   Number of children: Not on file   Years of education: Not on file   Highest education level: Not on file  Occupational History   Not on file  Tobacco Use   Smoking status: Every Day    Packs/day: 1.00    Types: Cigars, Cigarettes   Smokeless tobacco: Current  Vaping Use   Vaping Use: Never used  Substance and Sexual Activity   Alcohol use: Yes  Drug use: Not Currently   Sexual activity: Not Currently    Birth control/protection: Surgical  Other Topics Concern   Not on file  Social History Narrative   Not on file   Social Determinants of Health   Financial Resource Strain: Not on file  Food Insecurity: Not on file  Transportation Needs: Not on file  Physical Activity: Not on file  Stress: Not on file   Social Connections: Not on file   SDOH:  SDOH Screenings   Depression (PHQ2-9): Low Risk  (11/23/2021)  Tobacco Use: High Risk (02/03/2022)   Additional Social History:      Sleep: Good  Appetite:  Good  Current Medications:  Current Facility-Administered Medications  Medication Dose Route Frequency Provider Last Rate Last Admin   acetaminophen (TYLENOL) tablet 650 mg  650 mg Oral Q6H PRN Revonda Humphrey, NP   650 mg at 02/02/22 1933   albuterol (VENTOLIN HFA) 108 (90 Base) MCG/ACT inhaler 2 puff  2 puff Inhalation Q6H PRN Revonda Humphrey, NP       alum & mag hydroxide-simeth (MAALOX/MYLANTA) 200-200-20 MG/5ML suspension 30 mL  30 mL Oral Q4H PRN Revonda Humphrey, NP       apixaban Arne Cleveland) tablet 5 mg  5 mg Oral BID Revonda Humphrey, NP   5 mg at 02/02/22 2113   ARIPiprazole (ABILIFY) tablet 10 mg  10 mg Oral Daily Revonda Humphrey, NP   10 mg at 02/02/22 0915   cloZAPine (CLOZARIL) tablet 50 mg  50 mg Oral Daily Revonda Humphrey, NP   50 mg at 02/02/22 0915   cloZAPine (CLOZARIL) tablet 75 mg  75 mg Oral QHS Revonda Humphrey, NP   75 mg at 02/02/22 2113   diltiazem (CARDIZEM CD) 24 hr capsule 240 mg  240 mg Oral Daily Revonda Humphrey, NP   240 mg at 02/02/22 0915   divalproex (DEPAKOTE ER) 24 hr tablet 500 mg  500 mg Oral BID Revonda Humphrey, NP   500 mg at 02/02/22 2113   fluticasone furoate-vilanterol (BREO ELLIPTA) 200-25 MCG/ACT 1 puff  1 puff Inhalation Daily Revonda Humphrey, NP   1 puff at 02/03/22 0751   haloperidol (HALDOL) tablet 10 mg  10 mg Oral TID PRN Revonda Humphrey, NP       hydrOXYzine (ATARAX) tablet 25 mg  25 mg Oral TID PRN Revonda Humphrey, NP   25 mg at 02/01/22 2134   insulin aspart (novoLOG) injection 0-9 Units  0-9 Units Subcutaneous TID WC Revonda Humphrey, NP   1 Units at 02/03/22 0751   insulin glargine-yfgn (SEMGLEE) injection 12 Units  12 Units Subcutaneous Q1200 Revonda Humphrey, NP   12 Units at 02/02/22 1206    latanoprost (XALATAN) 0.005 % ophthalmic solution 1 drop  1 drop Both Eyes QHS Thomes Lolling H, NP   1 drop at 02/02/22 2114   magnesium hydroxide (MILK OF MAGNESIA) suspension 30 mL  30 mL Oral Daily PRN Revonda Humphrey, NP       menthol-cetylpyridinium (CEPACOL) lozenge 3 mg  1 lozenge Oral TID PRN Onuoha, Chinwendu V, NP   3 mg at 02/03/22 0639   metFORMIN (GLUCOPHAGE-XR) 24 hr tablet 1,000 mg  1,000 mg Oral BID WC Revonda Humphrey, NP   1,000 mg at 02/03/22 7829   nicotine (NICODERM CQ - dosed in mg/24 hr) patch 7 mg  7 mg Transdermal Daily Revonda Humphrey, NP   7 mg at  02/02/22 0915   traZODone (DESYREL) tablet 50 mg  50 mg Oral QHS PRN Revonda Humphrey, NP   50 mg at 02/01/22 2134   valbenazine (INGREZZA) capsule 40 mg  40 mg Oral q AM Revonda Humphrey, NP   40 mg at 02/03/22 0618   Vitamin D (Ergocalciferol) (DRISDOL) 1.25 MG (50000 UNIT) capsule 50,000 Units  50,000 Units Oral Q Sharren Bridge, NP   50,000 Units at 01/28/22 2774   Current Outpatient Medications  Medication Sig Dispense Refill   Accu-Chek Softclix Lancets lancets Use as directed up to four times daily 100 each 0   apixaban (ELIQUIS) 5 MG TABS tablet Take 1 tablet (5 mg total) by mouth 2 (two) times daily. 60 tablet 0   ARIPiprazole (ABILIFY) 10 MG tablet Take 1 tablet (10 mg total) by mouth daily. 30 tablet 0   Blood Glucose Monitoring Suppl (ACCU-CHEK GUIDE) w/Device KIT Use as directed up to four times daily 1 kit 0   budesonide-formoterol (SYMBICORT) 160-4.5 MCG/ACT inhaler Inhale 2 puffs into the lungs in the morning and at bedtime.     Cholecalciferol (VITAMIN D3) 1.25 MG (50000 UT) CAPS Take 50,000 Units by mouth every Thursday.     cloZAPine (CLOZARIL) 25 MG tablet Take 3 tablets (75 mg total) by mouth at bedtime. 90 tablet 0   clozapine (CLOZARIL) 50 MG tablet Take 1 tablet (50 mg total) by mouth daily. 30 tablet 0   diltiazem (CARDIZEM CD) 240 MG 24 hr capsule Take 1 capsule (240 mg total) by  mouth daily. (Patient not taking: Reported on 11/24/2021) 30 capsule 0   divalproex (DEPAKOTE ER) 500 MG 24 hr tablet Take 1 tablet (500 mg total) by mouth 2 (two) times daily. 60 tablet 0   glucose blood test strip Use as directed up to four times daily 50 each 0   haloperidol (HALDOL) 10 MG tablet Take 1 tablet (10 mg total) by mouth 3 (three) times daily as needed for agitation (and psychotic symptoms).     INGREZZA 40 MG capsule Take 1 capsule (40 mg total) by mouth in the morning. 30 capsule 0   insulin aspart (NOVOLOG) 100 UNIT/ML FlexPen Before each meal 3 times a day, 140-199 - 2 units, 200-250 - 4 units, 251-299 - 6 units,  300-349 - 8 units,  350 or above 10 units. Insulin PEN if approved, provide syringes and needles if needed.Please switch to any approved short acting Insulin if needed. 15 mL 0   insulin glargine (LANTUS) 100 UNIT/ML Solostar Pen Inject 12 Units into the skin daily. 15 mL 0   Insulin Pen Needle 32G X 4 MM MISC Use 4 times a day with insulin, 1 month supply. 100 each 0   latanoprost (XALATAN) 0.005 % ophthalmic solution Place 1 drop into both eyes at bedtime.     metFORMIN (GLUCOPHAGE) 500 MG tablet Take 500 mg by mouth 2 (two) times daily with a meal.     nicotine (NICODERM CQ - DOSED IN MG/24 HR) 7 mg/24hr patch Place 1 patch (7 mg total) onto the skin daily. 28 patch 0   PROAIR HFA 108 (90 Base) MCG/ACT inhaler Inhale 2 puffs into the lungs every 6 (six) hours as needed for wheezing or shortness of breath.      Labs  Lab Results:  Admission on 01/24/2022  Component Date Value Ref Range Status   Glucose-Capillary 01/24/2022 143 (H)  70 - 99 mg/dL Final   Glucose reference range applies  only to samples taken after fasting for at least 8 hours.   Glucose-Capillary 01/24/2022 120 (H)  70 - 99 mg/dL Final   Glucose reference range applies only to samples taken after fasting for at least 8 hours.   Glucose-Capillary 01/25/2022 122 (H)  70 - 99 mg/dL Final   Glucose  reference range applies only to samples taken after fasting for at least 8 hours.   Glucose-Capillary 01/25/2022 190 (H)  70 - 99 mg/dL Final   Glucose reference range applies only to samples taken after fasting for at least 8 hours.   Glucose-Capillary 01/25/2022 163 (H)  70 - 99 mg/dL Final   Glucose reference range applies only to samples taken after fasting for at least 8 hours.   WBC 01/26/2022 6.5  4.0 - 10.5 K/uL Final   RBC 01/26/2022 4.53  3.87 - 5.11 MIL/uL Final   Hemoglobin 01/26/2022 11.3 (L)  12.0 - 15.0 g/dL Final   HCT 01/26/2022 36.4  36.0 - 46.0 % Final   MCV 01/26/2022 80.4  80.0 - 100.0 fL Final   MCH 01/26/2022 24.9 (L)  26.0 - 34.0 pg Final   MCHC 01/26/2022 31.0  30.0 - 36.0 g/dL Final   RDW 01/26/2022 17.3 (H)  11.5 - 15.5 % Final   Platelets 01/26/2022 327  150 - 400 K/uL Final   nRBC 01/26/2022 0.0  0.0 - 0.2 % Final   Neutrophils Relative % 01/26/2022 60  % Final   Neutro Abs 01/26/2022 3.9  1.7 - 7.7 K/uL Final   Lymphocytes Relative 01/26/2022 31  % Final   Lymphs Abs 01/26/2022 2.0  0.7 - 4.0 K/uL Final   Monocytes Relative 01/26/2022 7  % Final   Monocytes Absolute 01/26/2022 0.5  0.1 - 1.0 K/uL Final   Eosinophils Relative 01/26/2022 1  % Final   Eosinophils Absolute 01/26/2022 0.1  0.0 - 0.5 K/uL Final   Basophils Relative 01/26/2022 1  % Final   Basophils Absolute 01/26/2022 0.1  0.0 - 0.1 K/uL Final   Immature Granulocytes 01/26/2022 0  % Final   Abs Immature Granulocytes 01/26/2022 0.02  0.00 - 0.07 K/uL Final   Performed at [facility], [place] [address] [place], [place] [address]   Glucose-Capillary 01/26/2022 149 (H)  70 - 99 mg/dL Final   Glucose reference range applies only to samples taken after fasting for at least 8 hours.   Glucose-Capillary 01/26/2022 130 (H)  70 - 99 mg/dL Final   Glucose reference range applies only to samples taken after fasting for at least 8 hours.   Glucose-Capillary 01/26/2022 151 (H)  70 - 99 mg/dL Final    Glucose reference range applies only to samples taken after fasting for at least 8 hours.   Glucose-Capillary 01/26/2022 170 (H)  70 - 99 mg/dL Final   Glucose reference range applies only to samples taken after fasting for at least 8 hours.   Glucose-Capillary 01/27/2022 129 (H)  70 - 99 mg/dL Final   Glucose reference range applies only to samples taken after fasting for at least 8 hours.   Glucose-Capillary 01/27/2022 101 (H)  70 - 99 mg/dL Final   Glucose reference range applies only to samples taken after fasting for at least 8 hours.   Glucose-Capillary 01/27/2022 220 (H)  70 - 99 mg/dL Final   Glucose reference range applies only to samples taken after fasting for at least 8 hours.   Glucose-Capillary 01/27/2022 154 (H)  70 - 99 mg/dL Final  Glucose reference range applies only to samples taken after fasting for at least 8 hours.   Glucose-Capillary 01/27/2022 163 (H)  70 - 99 mg/dL Final   Glucose reference range applies only to samples taken after fasting for at least 8 hours.   Glucose-Capillary 01/28/2022 127 (H)  70 - 99 mg/dL Final   Glucose reference range applies only to samples taken after fasting for at least 8 hours.   Glucose-Capillary 01/28/2022 110 (H)  70 - 99 mg/dL Final   Glucose reference range applies only to samples taken after fasting for at least 8 hours.   Glucose-Capillary 01/28/2022 167 (H)  70 - 99 mg/dL Final   Glucose reference range applies only to samples taken after fasting for at least 8 hours.   Glucose-Capillary 01/29/2022 145 (H)  70 - 99 mg/dL Final   Glucose reference range applies only to samples taken after fasting for at least 8 hours.   Glucose-Capillary 01/29/2022 203 (H)  70 - 99 mg/dL Final   Glucose reference range applies only to samples taken after fasting for at least 8 hours.   Glucose-Capillary 01/29/2022 153 (H)  70 - 99 mg/dL Final   Glucose reference range applies only to samples taken after fasting for at least 8 hours.    Glucose-Capillary 01/29/2022 166 (H)  70 - 99 mg/dL Final   Glucose reference range applies only to samples taken after fasting for at least 8 hours.   Glucose-Capillary 01/30/2022 142 (H)  70 - 99 mg/dL Final   Glucose reference range applies only to samples taken after fasting for at least 8 hours.   Glucose-Capillary 01/30/2022 138 (H)  70 - 99 mg/dL Final   Glucose reference range applies only to samples taken after fasting for at least 8 hours.   Glucose-Capillary 01/30/2022 185 (H)  70 - 99 mg/dL Final   Glucose reference range applies only to samples taken after fasting for at least 8 hours.   Glucose-Capillary 01/30/2022 220 (H)  70 - 99 mg/dL Final   Glucose reference range applies only to samples taken after fasting for at least 8 hours.   Glucose-Capillary 01/31/2022 147 (H)  70 - 99 mg/dL Final   Glucose reference range applies only to samples taken after fasting for at least 8 hours.   Glucose-Capillary 01/31/2022 131 (H)  70 - 99 mg/dL Final   Glucose reference range applies only to samples taken after fasting for at least 8 hours.   Glucose-Capillary 01/31/2022 169 (H)  70 - 99 mg/dL Final   Glucose reference range applies only to samples taken after fasting for at least 8 hours.   Glucose-Capillary 01/31/2022 144 (H)  70 - 99 mg/dL Final   Glucose reference range applies only to samples taken after fasting for at least 8 hours.   Comment 1 01/31/2022 RN AWARE   Final   Glucose-Capillary 02/01/2022 117 (H)  70 - 99 mg/dL Final   Glucose reference range applies only to samples taken after fasting for at least 8 hours.   Glucose-Capillary 02/01/2022 180 (H)  70 - 99 mg/dL Final   Glucose reference range applies only to samples taken after fasting for at least 8 hours.   Glucose-Capillary 02/01/2022 209 (H)  70 - 99 mg/dL Final   Glucose reference range applies only to samples taken after fasting for at least 8 hours.   Glucose-Capillary 02/02/2022 131 (H)  70 - 99 mg/dL Final    Glucose reference range applies only to samples taken  after fasting for at least 8 hours.   WBC 02/02/2022 7.5  4.0 - 10.5 K/uL Final   RBC 02/02/2022 4.52  3.87 - 5.11 MIL/uL Final   Hemoglobin 02/02/2022 11.3 (L)  12.0 - 15.0 g/dL Final   HCT 02/02/2022 35.0 (L)  36.0 - 46.0 % Final   MCV 02/02/2022 77.4 (L)  80.0 - 100.0 fL Final   MCH 02/02/2022 25.0 (L)  26.0 - 34.0 pg Final   MCHC 02/02/2022 32.3  30.0 - 36.0 g/dL Final   RDW 02/02/2022 16.7 (H)  11.5 - 15.5 % Final   Platelets 02/02/2022 345  150 - 400 K/uL Final   nRBC 02/02/2022 0.0  0.0 - 0.2 % Final   Neutrophils Relative % 02/02/2022 63  % Final   Neutro Abs 02/02/2022 4.7  1.7 - 7.7 K/uL Final   Lymphocytes Relative 02/02/2022 28  % Final   Lymphs Abs 02/02/2022 2.1  0.7 - 4.0 K/uL Final   Monocytes Relative 02/02/2022 7  % Final   Monocytes Absolute 02/02/2022 0.5  0.1 - 1.0 K/uL Final   Eosinophils Relative 02/02/2022 1  % Final   Eosinophils Absolute 02/02/2022 0.1  0.0 - 0.5 K/uL Final   Basophils Relative 02/02/2022 1  % Final   Basophils Absolute 02/02/2022 0.1  0.0 - 0.1 K/uL Final   Immature Granulocytes 02/02/2022 0  % Final   Abs Immature Granulocytes 02/02/2022 0.03  0.00 - 0.07 K/uL Final   Performed at [facility], [place] [address] [place], [place] [address]   Glucose-Capillary 02/02/2022 95  70 - 99 mg/dL Final   Glucose reference range applies only to samples taken after fasting for at least 8 hours.   Glucose-Capillary 02/02/2022 120 (H)  70 - 99 mg/dL Final   Glucose reference range applies only to samples taken after fasting for at least 8 hours.   Glucose-Capillary 02/02/2022 191 (H)  70 - 99 mg/dL Final   Glucose reference range applies only to samples taken after fasting for at least 8 hours.   Glucose-Capillary 02/03/2022 148 (H)  70 - 99 mg/dL Final   Glucose reference range applies only to samples taken after fasting for at least 8 hours.  Admission on 01/23/2022, Discharged on 01/24/2022   Component Date Value Ref Range Status   Sodium 01/23/2022 140  135 - 145 mmol/L Final   Potassium 01/23/2022 4.4  3.5 - 5.1 mmol/L Final   Chloride 01/23/2022 101  98 - 111 mmol/L Final   CO2 01/23/2022 25  22 - 32 mmol/L Final   Glucose, Bld 01/23/2022 198 (H)  70 - 99 mg/dL Final   Glucose reference range applies only to samples taken after fasting for at least 8 hours.   BUN 01/23/2022 13  6 - 20 mg/dL Final   Creatinine, Ser 01/23/2022 0.62  0.44 - 1.00 mg/dL Final   Calcium 01/23/2022 9.3  8.9 - 10.3 mg/dL Final   GFR, Estimated 01/23/2022 >60  >60 mL/min Final   Comment: (NOTE) Calculated using the CKD-EPI Creatinine Equation (2021)    Anion gap 01/23/2022 14  5 - 15 Final   Performed at [facility], [place] [address], [place], [place] [address]   WBC 01/23/2022 5.8  4.0 - 10.5 K/uL Final   RBC 01/23/2022 4.63  3.87 - 5.11 MIL/uL Final   Hemoglobin 01/23/2022 11.7 (L)  12.0 - 15.0 g/dL Final   HCT 01/23/2022 37.4  36.0 - 46.0 % Final   MCV 01/23/2022  80.8  80.0 - 100.0 fL Final   MCH 01/23/2022 25.3 (L)  26.0 - 34.0 pg Final   MCHC 01/23/2022 31.3  30.0 - 36.0 g/dL Final   RDW 01/23/2022 17.9 (H)  11.5 - 15.5 % Final   Platelets 01/23/2022 333  150 - 400 K/uL Final   nRBC 01/23/2022 0.0  0.0 - 0.2 % Final   Performed at [facility], [place] [address], [place], [place] [address]   Troponin I (High Sensitivity) 01/23/2022 6  <18 ng/L Final   Comment: (NOTE) Elevated high sensitivity troponin I (hsTnI) values and significant  changes across serial measurements may suggest ACS but many other  chronic and acute conditions are known to elevate hsTnI results.  Refer to the "Links" section for chest pain algorithms and additional  guidance. Performed at [facility], [place] [address] [place], [place] [address]    Troponin I (High Sensitivity) 01/23/2022 5  <18 ng/L Final   Comment: (NOTE) Elevated high sensitivity troponin I (hsTnI) values and significant  changes  across serial measurements may suggest ACS but many other  chronic and acute conditions are known to elevate hsTnI results.  Refer to the "Links" section for chest pain algorithms and additional  guidance. Performed at [facility], [place] [address] [place], [place] [address]    SARS Coronavirus 2 by RT PCR 01/23/2022 NEGATIVE  NEGATIVE Final   Comment: (NOTE) SARS-CoV-2 target nucleic acids are NOT DETECTED.  The SARS-CoV-2 RNA is generally detectable in upper and lower respiratory specimens during the acute phase of infection. The lowest concentration of SARS-CoV-2 viral copies this assay can detect is 250 copies / mL. A negative result does not preclude SARS-CoV-2 infection and should not be used as the sole basis for treatment or other patient management decisions.  A negative result may occur with improper specimen collection / handling, submission of specimen other than nasopharyngeal swab, presence of viral mutation(s) within the areas targeted by this assay, and inadequate number of viral copies (<250 copies / mL). A negative result must be combined with clinical observations, patient history, and epidemiological information.  Fact Sheet for Patients:   https://www.patel.info/  Fact Sheet for Healthcare Providers: https://hall.com/  This test is not yet approved or                           cleared by the [place] FDA and has been authorized for detection and/or diagnosis of SARS-CoV-2 by FDA under an Emergency Use Authorization (EUA).  This EUA will remain in effect (meaning this test can be used) for the duration of the COVID-19 declaration under Section 564(b)(1) of the Act, 21 U.S.C. section 360bbb-3(b)(1), unless the authorization is terminated or revoked sooner.  Performed at [facility], [place] [address] [place], [place] [address]    B Natriuretic Peptide 01/23/2022 41.3  0.0 - 100.0 pg/mL Final    Performed at [place] [address] [place], [place] [address]   Group A Strep by PCR 01/23/2022 NOT DETECTED  NOT DETECTED Final   Performed at [facility], [place] [address], [place], [place] [address]   Color, Urine 01/23/2022 YELLOW  YELLOW Final   APPearance 01/23/2022 CLEAR  CLEAR Final   Specific Gravity, Urine 01/23/2022 1.018  1.005 - 1.030 Final   pH 01/23/2022 7.0  5.0 - 8.0 Final   Glucose, UA 01/23/2022 NEGATIVE  NEGATIVE mg/dL Final  Hgb urine dipstick 01/23/2022 NEGATIVE  NEGATIVE Final   Bilirubin Urine 01/23/2022 NEGATIVE  NEGATIVE Final   Ketones, ur 01/23/2022 NEGATIVE  NEGATIVE mg/dL Final   Protein, ur 01/23/2022 NEGATIVE  NEGATIVE mg/dL Final   Nitrite 01/23/2022 NEGATIVE  NEGATIVE Final   Leukocytes,Ua 01/23/2022 TRACE (A)  NEGATIVE Final   RBC / HPF 01/23/2022 0-5  0 - 5 RBC/hpf Final   WBC, UA 01/23/2022 0-5  0 - 5 WBC/hpf Final   Bacteria, UA 01/23/2022 NONE SEEN  NONE SEEN Final   Squamous Epithelial / LPF 01/23/2022 0-5  0 - 5 Final   Mucus 01/23/2022 PRESENT   Final   Performed at [facility], [place] [address] [place], [place] [address]   SARS Coronavirus 2 by RT PCR 01/23/2022 NEGATIVE  NEGATIVE Final   Comment: (NOTE) SARS-CoV-2 target nucleic acids are NOT DETECTED.  The SARS-CoV-2 RNA is generally detectable in upper respiratory specimens during the acute phase of infection. The lowest concentration of SARS-CoV-2 viral copies this assay can detect is 138 copies/mL. A negative result does not preclude SARS-Cov-2 infection and should not be used as the sole basis for treatment or other patient management decisions. A negative result may occur with  improper specimen collection/handling, submission of specimen other than nasopharyngeal swab, presence of viral mutation(s) within the areas targeted by this assay, and inadequate number of viral copies(<138 copies/mL). A negative result must be combined with clinical observations,  patient history, and epidemiological information. The expected result is Negative.  Fact Sheet for Patients:  EntrepreneurPulse.com.au  Fact Sheet for Healthcare Providers:  IncredibleEmployment.be  This test is no                          t yet approved or cleared by the [place] FDA and  has been authorized for detection and/or diagnosis of SARS-CoV-2 by FDA under an Emergency Use Authorization (EUA). This EUA will remain  in effect (meaning this test can be used) for the duration of the COVID-19 declaration under Section 564(b)(1) of the Act, 21 U.S.C.section 360bbb-3(b)(1), unless the authorization is terminated  or revoked sooner.       Influenza A by PCR 01/23/2022 NEGATIVE  NEGATIVE Final   Influenza B by PCR 01/23/2022 NEGATIVE  NEGATIVE Final   Comment: (NOTE) The Xpert Xpress SARS-CoV-2/FLU/RSV plus assay is intended as an aid in the diagnosis of influenza from Nasopharyngeal swab specimens and should not be used as a sole basis for treatment. Nasal washings and aspirates are unacceptable for Xpert Xpress SARS-CoV-2/FLU/RSV testing.  Fact Sheet for Patients: EntrepreneurPulse.com.au  Fact Sheet for Healthcare Providers: IncredibleEmployment.be  This test is not yet approved or cleared by the [place] FDA and has been authorized for detection and/or diagnosis of SARS-CoV-2 by FDA under an Emergency Use Authorization (EUA). This EUA will remain in effect (meaning this test can be used) for the duration of the COVID-19 declaration under Section 564(b)(1) of the Act, 21 U.S.C. section 360bbb-3(b)(1), unless the authorization is terminated or revoked.  Performed at [facility], [place] [address] [place], [place] [address]    WBC 01/24/2022 6.1  4.0 - 10.5 K/uL Final   RBC 01/24/2022 4.60  3.87 - 5.11 MIL/uL Final   Hemoglobin 01/24/2022 11.7 (L)  12.0 - 15.0 g/dL Final   HCT  01/24/2022 37.0  36.0 - 46.0 % Final   MCV 01/24/2022 80.4  80.0 - 100.0 fL Final   MCH  01/24/2022 25.4 (L)  26.0 - 34.0 pg Final   MCHC 01/24/2022 31.6  30.0 - 36.0 g/dL Final   RDW 01/24/2022 17.6 (H)  11.5 - 15.5 % Final   Platelets 01/24/2022 334  150 - 400 K/uL Final   nRBC 01/24/2022 0.0  0.0 - 0.2 % Final   Neutrophils Relative % 01/24/2022 53  % Final   Neutro Abs 01/24/2022 3.3  1.7 - 7.7 K/uL Final   Lymphocytes Relative 01/24/2022 33  % Final   Lymphs Abs 01/24/2022 2.0  0.7 - 4.0 K/uL Final   Monocytes Relative 01/24/2022 11  % Final   Monocytes Absolute 01/24/2022 0.7  0.1 - 1.0 K/uL Final   Eosinophils Relative 01/24/2022 2  % Final   Eosinophils Absolute 01/24/2022 0.1  0.0 - 0.5 K/uL Final   Basophils Relative 01/24/2022 1  % Final   Basophils Absolute 01/24/2022 0.1  0.0 - 0.1 K/uL Final   Immature Granulocytes 01/24/2022 0  % Final   Abs Immature Granulocytes 01/24/2022 0.02  0.00 - 0.07 K/uL Final   Performed at [facility], [place] [address], [place], [place] [address]   Glucose-Capillary 01/24/2022 110 (H)  70 - 99 mg/dL Final   Glucose reference range applies only to samples taken after fasting for at least 8 hours.  No results displayed because visit has over 200 results.    Admission on 11/22/2021, Discharged on 11/22/2021  Component Date Value Ref Range Status   Glucose-Capillary 11/22/2021 169 (H)  70 - 99 mg/dL Final   Glucose reference range applies only to samples taken after fasting for at least 8 hours.  No results displayed because visit has over 200 results.    Admission on 10/04/2021, Discharged on 10/22/2021  Component Date Value Ref Range Status   SARS Coronavirus 2 by RT PCR 10/04/2021 NEGATIVE  NEGATIVE Final   Comment: (NOTE) SARS-CoV-2 target nucleic acids are NOT DETECTED.  The SARS-CoV-2 RNA is generally detectable in upper respiratory specimens during the acute phase of infection. The lowest concentration of SARS-CoV-2 viral copies  this assay can detect is 138 copies/mL. A negative result does not preclude SARS-Cov-2 infection and should not be used as the sole basis for treatment or other patient management decisions. A negative result may occur with  improper specimen collection/handling, submission of specimen other than nasopharyngeal swab, presence of viral mutation(s) within the areas targeted by this assay, and inadequate number of viral copies(<138 copies/mL). A negative result must be combined with clinical observations, patient history, and epidemiological information. The expected result is Negative.  Fact Sheet for Patients:  EntrepreneurPulse.com.au  Fact Sheet for Healthcare Providers:  IncredibleEmployment.be  This test is no                          t yet approved or cleared by the [place] FDA and  has been authorized for detection and/or diagnosis of SARS-CoV-2 by FDA under an Emergency Use Authorization (EUA). This EUA will remain  in effect (meaning this test can be used) for the duration of the COVID-19 declaration under Section 564(b)(1) of the Act, 21 U.S.C.section 360bbb-3(b)(1), unless the authorization is terminated  or revoked sooner.       Influenza A by PCR 10/04/2021 NEGATIVE  NEGATIVE Final   Influenza B by PCR 10/04/2021 NEGATIVE  NEGATIVE Final   Comment: (NOTE) The Xpert Xpress SARS-CoV-2/FLU/RSV plus assay is intended as an aid in the diagnosis of influenza from  Nasopharyngeal swab specimens and should not be used as a sole basis for treatment. Nasal washings and aspirates are unacceptable for Xpert Xpress SARS-CoV-2/FLU/RSV testing.  Fact Sheet for Patients: EntrepreneurPulse.com.au  Fact Sheet for Healthcare Providers: IncredibleEmployment.be  This test is not yet approved or cleared by the [place] FDA and has been authorized for detection and/or diagnosis of SARS-CoV-2 by FDA under  an Emergency Use Authorization (EUA). This EUA will remain in effect (meaning this test can be used) for the duration of the COVID-19 declaration under Section 564(b)(1) of the Act, 21 U.S.C. section 360bbb-3(b)(1), unless the authorization is terminated or revoked.  Performed at [facility], [place] [address], [place], [place] [address]    WBC 10/04/2021 8.4  4.0 - 10.5 K/uL Final   RBC 10/04/2021 4.42  3.87 - 5.11 MIL/uL Final   Hemoglobin 10/04/2021 11.6 (L)  12.0 - 15.0 g/dL Final   HCT 10/04/2021 35.7 (L)  36.0 - 46.0 % Final   MCV 10/04/2021 80.8  80.0 - 100.0 fL Final   MCH 10/04/2021 26.2  26.0 - 34.0 pg Final   MCHC 10/04/2021 32.5  30.0 - 36.0 g/dL Final   RDW 10/04/2021 16.0 (H)  11.5 - 15.5 % Final   Platelets 10/04/2021 372  150 - 400 K/uL Final   nRBC 10/04/2021 0.0  0.0 - 0.2 % Final   Neutrophils Relative % 10/04/2021 68  % Final   Neutro Abs 10/04/2021 5.7  1.7 - 7.7 K/uL Final   Lymphocytes Relative 10/04/2021 22  % Final   Lymphs Abs 10/04/2021 1.8  0.7 - 4.0 K/uL Final   Monocytes Relative 10/04/2021 8  % Final   Monocytes Absolute 10/04/2021 0.7  0.1 - 1.0 K/uL Final   Eosinophils Relative 10/04/2021 1  % Final   Eosinophils Absolute 10/04/2021 0.1  0.0 - 0.5 K/uL Final   Basophils Relative 10/04/2021 1  % Final   Basophils Absolute 10/04/2021 0.1  0.0 - 0.1 K/uL Final   Immature Granulocytes 10/04/2021 0  % Final   Abs Immature Granulocytes 10/04/2021 0.03  0.00 - 0.07 K/uL Final   Performed at [facility], [place] [address] [place], [place] [address]   Sodium 10/04/2021 136  135 - 145 mmol/L Final   Potassium 10/04/2021 4.2  3.5 - 5.1 mmol/L Final   Chloride 10/04/2021 104  98 - 111 mmol/L Final   CO2 10/04/2021 25  22 - 32 mmol/L Final   Glucose, Bld 10/04/2021 100 (H)  70 - 99 mg/dL Final   Glucose reference range applies only to samples taken after fasting for at least 8 hours.   BUN 10/04/2021 9  6 - 20 mg/dL Final   Creatinine, Ser  10/04/2021 0.52  0.44 - 1.00 mg/dL Final   Calcium 10/04/2021 9.0  8.9 - 10.3 mg/dL Final   Total Protein 10/04/2021 7.0  6.5 - 8.1 g/dL Final   Albumin 10/04/2021 3.2 (L)  3.5 - 5.0 g/dL Final   AST 10/04/2021 13 (L)  15 - 41 U/L Final   ALT 10/04/2021 10  0 - 44 U/L Final   Alkaline Phosphatase 10/04/2021 61  38 - 126 U/L Final   Total Bilirubin 10/04/2021 0.3  0.3 - 1.2 mg/dL Final   GFR, Estimated 10/04/2021 >60  >60 mL/min Final   Comment: (NOTE) Calculated using the CKD-EPI Creatinine Equation (2021)    Anion gap 10/04/2021 7  5 - 15 Final   Performed at [facility], [address] [place], [place] [address]   Hgb A1c MFr Bld 10/04/2021 7.0 (H)  4.8 - 5.6 % Final   Comment: (NOTE) Pre diabetes:          5.7%-6.4%  Diabetes:              >6.4%  Glycemic control for   <7.0% adults with diabetes    Mean Plasma Glucose 10/04/2021 154.2  mg/dL Final   Performed at [facility], [place] [address] [place], [place] [address]   Cholesterol 10/04/2021 178  0 - 200 mg/dL Final   Triglycerides 10/04/2021 155 (H)  <150 mg/dL Final   HDL 10/04/2021 52  >40 mg/dL Final   Total CHOL/HDL Ratio 10/04/2021 3.4  RATIO Final   VLDL 10/04/2021 31  0 - 40 mg/dL Final   LDL Cholesterol 10/04/2021 95  0 - 99 mg/dL Final   Comment:        Total Cholesterol/HDL:CHD Risk Coronary Heart Disease Risk Table                     Men   Women  1/2 Average Risk   3.4   3.3  Average Risk       5.0   4.4  2 X Average Risk   9.6   7.1  3 X Average Risk  23.4   11.0        Use the calculated Patient Ratio above and the CHD Risk Table to determine the patient's CHD Risk.        ATP III CLASSIFICATION (LDL):  <100     mg/dL   Optimal  100-129  mg/dL   Near or Above                    Optimal  130-159  mg/dL   Borderline  160-189  mg/dL   High  >190     mg/dL   Very High Performed at [facility] [address] [place], [place] [address]    POC Amphetamine UR 10/04/2021 None  Detected  NONE DETECTED (Cut Off Level 1000 ng/mL) Final   POC Secobarbital (BAR) 10/04/2021 None Detected  NONE DETECTED (Cut Off Level 300 ng/mL) Final   POC Buprenorphine (BUP) 10/04/2021 None Detected  NONE DETECTED (Cut Off Level 10 ng/mL) Final   POC Oxazepam (BZO) 10/04/2021 None Detected  NONE DETECTED (Cut Off Level 300 ng/mL) Final   POC Cocaine UR 10/04/2021 None Detected  NONE DETECTED (Cut Off Level 300 ng/mL) Final   POC Methamphetamine UR 10/04/2021 None Detected  NONE DETECTED (Cut Off Level 1000 ng/mL) Final   POC Morphine 10/04/2021 None Detected  NONE DETECTED (Cut Off Level 300 ng/mL) Final   POC Methadone UR 10/04/2021 None Detected  NONE DETECTED (Cut Off Level 300 ng/mL) Final   POC Oxycodone UR 10/04/2021 Positive (A)  NONE DETECTED (Cut Off Level 100 ng/mL) Final   POC Marijuana UR 10/04/2021 None Detected  NONE DETECTED (Cut Off Level 50 ng/mL) Final   SARSCOV2ONAVIRUS 2 AG 10/04/2021 NEGATIVE  NEGATIVE Final   Comment: (NOTE) SARS-CoV-2 antigen NOT DETECTED.   Negative results are presumptive.  Negative results do not preclude SARS-CoV-2 infection and should not be used as the sole basis for treatment or other patient management decisions, including infection  control decisions, particularly in the presence of clinical signs and  symptoms consistent with COVID-19, or in those who have been in contact with the virus.  Negative  results must be combined with clinical observations, patient history, and epidemiological information. The expected result is Negative.  Fact Sheet for Patients: HandmadeRecipes.com.cy  Fact Sheet for Healthcare Providers: FuneralLife.at  This test is not yet approved or cleared by the [place] FDA and  has been authorized for detection and/or diagnosis of SARS-CoV-2 by FDA under an Emergency Use Authorization (EUA).  This EUA will remain in effect (meaning this test can be used) for the  duration of  the COV                          ID-19 declaration under Section 564(b)(1) of the Act, 21 U.S.C. section 360bbb-3(b)(1), unless the authorization is terminated or revoked sooner.     Valproic Acid Lvl 10/04/2021 51  50.0 - 100.0 ug/mL Final   Performed at [facility] [address] [place], [place] [address]   Valproic Acid Lvl 10/08/2021 57  50.0 - 100.0 ug/mL Final   Performed at [facility] [address], [place], [place] [address]   Glucose-Capillary 10/09/2021 123 (H)  70 - 99 mg/dL Final   Glucose reference range applies only to samples taken after fasting for at least 8 hours.  Admission on 09/09/2021, Discharged on 09/10/2021  Component Date Value Ref Range Status   Sodium 09/09/2021 134 (L)  135 - 145 mmol/L Final   Potassium 09/09/2021 4.3  3.5 - 5.1 mmol/L Final   Chloride 09/09/2021 99  98 - 111 mmol/L Final   CO2 09/09/2021 25  22 - 32 mmol/L Final   Glucose, Bld 09/09/2021 107 (H)  70 - 99 mg/dL Final   Glucose reference range applies only to samples taken after fasting for at least 8 hours.   BUN 09/09/2021 12  6 - 20 mg/dL Final   Creatinine, Ser 09/09/2021 0.74  0.44 - 1.00 mg/dL Final   Calcium 09/09/2021 9.0  8.9 - 10.3 mg/dL Final   Total Protein 09/09/2021 7.4  6.5 - 8.1 g/dL Final   Albumin 09/09/2021 3.1 (L)  3.5 - 5.0 g/dL Final   AST 09/09/2021 13 (L)  15 - 41 U/L Final   ALT 09/09/2021 13  0 - 44 U/L Final   Alkaline Phosphatase 09/09/2021 66  38 - 126 U/L Final   Total Bilirubin 09/09/2021 0.2 (L)  0.3 - 1.2 mg/dL Final   GFR, Estimated 09/09/2021 >60  >60 mL/min Final   Comment: (NOTE) Calculated using the CKD-EPI Creatinine Equation (2021)    Anion gap 09/09/2021 10  5 - 15 Final   Performed at [facility], [place] [address], [place], [place] [address]   WBC 09/09/2021 8.5  4.0 - 10.5 K/uL Final   RBC 09/09/2021 4.53  3.87 - 5.11 MIL/uL Final   Hemoglobin 09/09/2021 11.8 (L)  12.0 - 15.0 g/dL Final   HCT 09/09/2021  38.0  36.0 - 46.0 % Final   MCV 09/09/2021 83.9  80.0 - 100.0 fL Final   MCH 09/09/2021 26.0  26.0 - 34.0 pg Final   MCHC 09/09/2021 31.1  30.0 - 36.0 g/dL Final   RDW 09/09/2021 17.8 (H)  11.5 - 15.5 % Final   Platelets 09/09/2021 442 (H)  150 - 400 K/uL Final   nRBC 09/09/2021 0.0  0.0 - 0.2 % Final   Neutrophils Relative % 09/09/2021 70  % Final   Neutro Abs 09/09/2021 5.9  1.7 - 7.7 K/uL Final   Lymphocytes Relative 09/09/2021  20  % Final   Lymphs Abs 09/09/2021 1.7  0.7 - 4.0 K/uL Final   Monocytes Relative 09/09/2021 8  % Final   Monocytes Absolute 09/09/2021 0.7  0.1 - 1.0 K/uL Final   Eosinophils Relative 09/09/2021 1  % Final   Eosinophils Absolute 09/09/2021 0.1  0.0 - 0.5 K/uL Final   Basophils Relative 09/09/2021 1  % Final   Basophils Absolute 09/09/2021 0.1  0.0 - 0.1 K/uL Final   Immature Granulocytes 09/09/2021 0  % Final   Abs Immature Granulocytes 09/09/2021 0.03  0.00 - 0.07 K/uL Final   Performed at [facility] [address], [place], [place] [address]   Ammonia 09/09/2021 37 (H)  9 - 35 umol/L Final   Comment: HEMOLYSIS AT THIS LEVEL MAY AFFECT RESULT Performed at [facility], [place] [address] [place], [place] [address]    Opiates 09/09/2021 NONE DETECTED  NONE DETECTED Final   Cocaine 09/09/2021 NONE DETECTED  NONE DETECTED Final   Benzodiazepines 09/09/2021 NONE DETECTED  NONE DETECTED Final   Amphetamines 09/09/2021 NONE DETECTED  NONE DETECTED Final   Tetrahydrocannabinol 09/09/2021 NONE DETECTED  NONE DETECTED Final   Barbiturates 09/09/2021 NONE DETECTED  NONE DETECTED Final   Comment: (NOTE) DRUG SCREEN FOR MEDICAL PURPOSES ONLY.  IF CONFIRMATION IS NEEDED FOR ANY PURPOSE, NOTIFY LAB WITHIN 5 DAYS.  LOWEST DETECTABLE LIMITS FOR URINE DRUG SCREEN Drug Class                     Cutoff (ng/mL) Amphetamine and metabolites    1000 Barbiturate and metabolites    200 Benzodiazepine                 825 Tricyclics and metabolites      300 Opiates and metabolites        300 Cocaine and metabolites        300 THC                            50 Performed at [facility], [place] [address], [place], [place] [address]    Alcohol, Ethyl (B) 09/09/2021 <10  <10 mg/dL Final   Comment: (NOTE) Lowest detectable limit for serum alcohol is 10 mg/dL.  For medical purposes only. Performed at [facility], [place] [address], [place], [place] [address]    Lipase 09/09/2021 33  11 - 51 U/L Final   Performed at [facility] [address] [place], [place] [address]   Color, Urine 09/09/2021 COLORLESS (A)  YELLOW Final   APPearance 09/09/2021 CLEAR  CLEAR Final   Specific Gravity, Urine 09/09/2021 1.002 (L)  1.005 - 1.030 Final   pH 09/09/2021 6.0  5.0 - 8.0 Final   Glucose, UA 09/09/2021 NEGATIVE  NEGATIVE mg/dL Final   Hgb urine dipstick 09/09/2021 NEGATIVE  NEGATIVE Final   Bilirubin Urine 09/09/2021 NEGATIVE  NEGATIVE Final   Ketones, ur 09/09/2021 NEGATIVE  NEGATIVE mg/dL Final   Protein, ur 09/09/2021 NEGATIVE  NEGATIVE mg/dL Final   Nitrite 09/09/2021 NEGATIVE  NEGATIVE Final   Leukocytes,Ua 09/09/2021 NEGATIVE  NEGATIVE Final   Performed at [facility] [address] [place], [place] [address]   Specimen Description 09/09/2021 URINE, CLEAN CATCH   Final   Special Requests 09/09/2021 NONE   Final   Culture 09/09/2021  (A)   Final  Value:<10,000 COLONIES/mL INSIGNIFICANT GROWTH Performed at [facility] [address], [place], [place] [address]    Report Status 09/09/2021 09/10/2021 FINAL   Final   D-Dimer, Quant 09/09/2021 0.92 (H)  0.00 - 0.50 ug/mL-FEU Final   Comment: (NOTE) At the manufacturer cut-off value of 0.5 g/mL FEU, this assay has a negative predictive value of 95-100%.This assay is intended for use in conjunction with a clinical pretest probability (PTP) assessment model to exclude pulmonary embolism (PE) and deep venous thrombosis (DVT) in outpatients  suspected of PE or DVT. Results should be correlated with clinical presentation. Performed at [facility], [place] [address] [place], [place] [address]    Troponin I (High Sensitivity) 09/09/2021 5  <18 ng/L Final   Comment: (NOTE) Elevated high sensitivity troponin I (hsTnI) values and significant  changes across serial measurements may suggest ACS but many other  chronic and acute conditions are known to elevate hsTnI results.  Refer to the "Links" section for chest pain algorithms and additional  guidance. Performed at [facility], [place] [address] [place], [place] [address]    BP 09/10/2021 135/83  mmHg Final   S' Lateral 09/10/2021 2.30  cm Final   Area-P 1/2 09/10/2021 5.58  cm2 Final    Blood Alcohol level:  Lab Results  Component Value Date   ETH <10 11/23/2021   ETH <10 89/16/9450    Metabolic Disorder Labs: Lab Results  Component Value Date   HGBA1C 6.7 (H) 12/28/2021   MPG 145.59 12/28/2021   MPG 154.2 10/04/2021   Lab Results  Component Value Date   PROLACTIN 3.8 (L) 11/23/2021   Lab Results  Component Value Date   CHOL 171 11/23/2021   TRIG 125 11/23/2021   HDL 65 11/23/2021   CHOLHDL 2.6 11/23/2021   VLDL 25 11/23/2021   LDLCALC 81 11/23/2021   LDLCALC 95 10/04/2021    Therapeutic Lab Levels: No results found for: "LITHIUM" Lab Results  Component Value Date   VALPROATE 33 (L) 01/18/2022   VALPROATE 33 (L) 12/28/2021   No results found for: "CBMZ"  Physical Findings   PHQ2-9    Flowsheet Row ED from 11/23/2021 in [facility]  PHQ-2 Total Score 2  PHQ-9 Total Score 3      Flowsheet Row ED from 01/23/2022 in [facility] ED from 11/23/2021 in [facility] ED from 11/22/2021 in [facility] DEPT  C-SSRS RISK CATEGORY No Risk No Risk No Risk        Musculoskeletal  Strength & Muscle Tone: within normal  limits Gait & Station: normal Patient leans: N/A  Psychiatric Specialty Exam  Presentation  General Appearance:  Appropriate for Environment  Eye Contact: Good  Speech: Clear and Coherent; Normal Rate  Speech Volume: Normal  Handedness: Right   Mood and Affect  Mood: Euthymic  Affect: Congruent   Thought Process  Thought Processes: Coherent  Descriptions of Associations:Intact  Orientation:Full (Time, Place and Person)  Thought Content:Logical  Diagnosis of Schizophrenia or Schizoaffective disorder in past: No  Duration of Psychotic Symptoms: No data recorded  Hallucinations:Hallucinations: None  Ideas of Reference:None  Suicidal Thoughts:Suicidal Thoughts: No  Homicidal Thoughts:Homicidal Thoughts: No   Sensorium  Memory: Immediate Good; Recent Good  Judgment: Intact  Insight: Present   Executive Functions  Concentration: Good  Attention Span: Good  Recall: Good  Fund of Knowledge: Good  Language: Good   Psychomotor Activity  Psychomotor Activity: Psychomotor Activity: Normal   Assets  Assets: Communication Skills; Desire for Improvement; Financial Resources/Insurance; Leisure Time; Physical Health   Sleep  Sleep: Sleep: Good   Nutritional Assessment (For OBS and FBC admissions only) Has the patient had a weight loss or gain of 10 pounds or more in the last 3 months?: No Has the patient had a decrease in food intake/or appetite?: No Does the patient have dental problems?: No Does the patient have eating habits or behaviors that may be indicators of an eating disorder including binging or inducing vomiting?: No Has the patient recently lost weight without trying?: 0 Has the patient been eating poorly because of a decreased appetite?: 0 Malnutrition Screening Tool Score: 0   Physical Exam  Physical Exam Vitals and nursing note reviewed.  Constitutional:      General: She is not in acute distress.    Appearance:  Normal appearance.  HENT:     Head: Normocephalic.  Eyes:     General:        Right eye: No discharge.        Left eye: No discharge.     Conjunctiva/sclera: Conjunctivae normal.  Cardiovascular:     Rate and Rhythm: Normal rate.  Pulmonary:     Effort: Pulmonary effort is normal. No respiratory distress.  Musculoskeletal:        General: No swelling or tenderness. Normal range of motion.     Cervical back: Normal range of motion.       Legs:     Comments: Soreness and tightness  Skin:    General: Skin is warm and dry.  Neurological:     Mental Status: She is alert and oriented to person, place, and time.  Psychiatric:        Attention and Perception: Attention and perception normal. She does not perceive auditory or visual hallucinations.        Mood and Affect: Mood and affect normal.        Speech: Speech normal.        Behavior: Behavior is cooperative.        Thought Content: Thought content normal. Thought content is not paranoid or delusional. Thought content does not include homicidal or suicidal ideation.        Cognition and Memory: Cognition normal.        Judgment: Judgment normal.    Review of Systems  Constitutional: Negative.   HENT: Negative.    Eyes: Negative.   Respiratory: Negative.    Cardiovascular: Negative.   Musculoskeletal:  Positive for myalgias (Complaints of pain in right leg back of thigh.  States she may had slept the wrong way.  "It hurts").  Skin: Negative.   Neurological: Negative.   Psychiatric/Behavioral: Negative.  Negative for depression, hallucinations, substance abuse and suicidal ideas. The patient is not nervous/anxious and does not have insomnia.    Blood pressure 122/82, pulse (!) 118, temperature 98.1 F (36.7 C), temperature source Tympanic, resp. rate 20, SpO2 95 %. There is no height or weight on file to calculate BMI.   Treatment Plan Summary:   Patient remains cleared by psychiatry. TOC team seeking disposition plan.   Will continue to have daily contact with patient to assess and evaluate symptoms and progress in treatment  Voltaren 1% topical added to medications for pain Bid prn  Thelia Tanksley, NP 02/03/2022 8:56 AM

## 2022-02-03 NOTE — ED Notes (Signed)
Patient a&o x3. Denies SI/HI/AVH..Denies intent or plan  to harm self or others. Routine conducted according to faculty protocol .Encourge patient to notify  staff with any needs or concerns. Patient verbalized agreement and understanding .Will continue to monitor for safety. 

## 2022-02-03 NOTE — ED Notes (Signed)
Pt taken off unit by SW to attend a virtual meeting.

## 2022-02-03 NOTE — ED Notes (Signed)
Pt sleeping at present, no distress noted.  Monitoring for safety. 

## 2022-02-03 NOTE — Progress Notes (Signed)
LCSW Progress Note   1100 - Virtual meeting with Harless Nakayama, President of [employer], [phone], for preliminary interview with pt.  LCSW forwarded FL2 to Ms. Algis Greenhouse as requested as medications were discussed in the meeting and the pt did not know everything she was taking.    Hansel Starling, MSW, LCSW [facility] [phone] phone [phone] fax

## 2022-02-04 DIAGNOSIS — F209 Schizophrenia, unspecified: Secondary | ICD-10-CM | POA: Diagnosis not present

## 2022-02-04 LAB — GLUCOSE, CAPILLARY
Glucose-Capillary: 126 mg/dL — ABNORMAL HIGH (ref 70–99)
Glucose-Capillary: 117 mg/dL — ABNORMAL HIGH (ref 70–99)
Glucose-Capillary: 135 mg/dL — ABNORMAL HIGH (ref 70–99)
Glucose-Capillary: 197 mg/dL — ABNORMAL HIGH (ref 70–99)

## 2022-02-04 NOTE — ED Provider Notes (Signed)
Behavioral Health Progress Note  Date and Time: 02/04/2022 10:13 AM Name: Teresa Murillo MRN:  [id number]  Subjective:  "I am going to go to a new place soon, I heard I have been accepted to a new place."   Per chart review: Teresa Murillo 61 y.o., female patient with a past psychiatric history of schizophrenia, aggressive behavior, and borderline intellectual disability who initially presented to [facility] on 11/23/2021 via [facility].  At that time patient had been dismissed from the group home due to multiple elopements.  Patient has remained at the [facility] while awaiting new placement.  Social work and DSS are involved.  Patient is reassessed, face-to-face, by nurse practitioner.  She is reclined in observation area.  She is alert and oriented, pleasant and cooperative during assessment.  She presents with euthymic mood, bright affect.  Kit is actively engaged with staff and peers on unit.  She is compliant with medications.  She endorses average sleep and appetite.  Patient continues to deny suicidal and homicidal ideation.  She denies auditory and visual hallucination.  There is no evidence of delusional thought content and no indication that patient is responding to internal stimuli.  She denies symptoms of paranoia.  She contracts verbally for safety with this Probation officer.  Patient offered support and encouragement.  She verbalizes understanding of treatment plan to include awaiting disposition.  Disposition social work completed meeting with potential placement on yesterday.  Disposition social work will continue to attempt to assist with safe disposition plan.    Diagnosis:  Final diagnoses:  Schizophrenia, unspecified type ([place])  At risk for self care deficit  Noncompliance  Type 2 diabetes mellitus without complication, unspecified whether long term insulin use ([place])    Total Time spent with patient: 30 minutes  Past Psychiatric History: see above Past Medical History:  Past Medical  History:  Diagnosis Date   Borderline intellectual functioning 12/14/2021   On 12/14/2021: Appreciate assistance from psychology consult. On the Wechsler Adult Intelligence Scale-4, Ms. Rudie achieved a full-scale IQ score of 73 and a percentile rank of 4 placing her in the borderline range of intellectual functioning.    Chronic obstructive pulmonary disease (COPD) (HCC)    Glaucoma    Hyperlipidemia    Hypertension    Iron deficiency    Schizoaffective disorder (HCC)    Type 2 diabetes mellitus (HCC)     Past Surgical History:  Procedure Laterality Date   TUBAL LIGATION     Family History:  Family History  Problem Relation Age of Onset   Breast cancer Maternal Grandmother    Family Psychiatric  History: See H&P Social History:  Social History   Substance and Sexual Activity  Alcohol Use Yes     Social History   Substance and Sexual Activity  Drug Use Not Currently    Social History   Socioeconomic History   Marital status: Divorced    Spouse name: Not on file   Number of children: Not on file   Years of education: Not on file   Highest education level: Not on file  Occupational History   Not on file  Tobacco Use   Smoking status: Every Day    Packs/day: 1.00    Types: Cigars, Cigarettes   Smokeless tobacco: Current  Vaping Use   Vaping Use: Never used  Substance and Sexual Activity   Alcohol use: Yes   Drug use: Not Currently   Sexual activity: Not Currently    Birth control/protection: Surgical  Other Topics Concern   Not on file  Social History Narrative   Not on file   Social Determinants of Health   Financial Resource Strain: Not on file  Food Insecurity: Not on file  Transportation Needs: Not on file  Physical Activity: Not on file  Stress: Not on file  Social Connections: Not on file   SDOH:  SDOH Screenings   Depression (PHQ2-9): Low Risk  (11/23/2021)  Tobacco Use: High Risk (02/03/2022)   Additional Social History:                          Sleep: Good  Appetite:  Good  Current Medications:  Current Facility-Administered Medications  Medication Dose Route Frequency Provider Last Rate Last Admin   acetaminophen (TYLENOL) tablet 650 mg  650 mg Oral Q6H PRN Revonda Humphrey, NP   650 mg at 02/03/22 2132   albuterol (VENTOLIN HFA) 108 (90 Base) MCG/ACT inhaler 2 puff  2 puff Inhalation Q6H PRN Revonda Humphrey, NP       alum & mag hydroxide-simeth (MAALOX/MYLANTA) 200-200-20 MG/5ML suspension 30 mL  30 mL Oral Q4H PRN Revonda Humphrey, NP   30 mL at 02/03/22 2302   apixaban (ELIQUIS) tablet 5 mg  5 mg Oral BID Revonda Humphrey, NP   5 mg at 02/04/22 0919   ARIPiprazole (ABILIFY) tablet 10 mg  10 mg Oral Daily Revonda Humphrey, NP   10 mg at 02/04/22 0919   cloZAPine (CLOZARIL) tablet 50 mg  50 mg Oral Daily Revonda Humphrey, NP   50 mg at 02/04/22 0919   cloZAPine (CLOZARIL) tablet 75 mg  75 mg Oral QHS Revonda Humphrey, NP   75 mg at 02/03/22 2132   diclofenac Sodium (VOLTAREN) 1 % topical gel 2 g  2 g Topical BID PRN Rankin, Shuvon B, NP   2 g at 02/03/22 0920   diltiazem (CARDIZEM CD) 24 hr capsule 240 mg  240 mg Oral Daily Revonda Humphrey, NP   240 mg at 02/04/22 0919   divalproex (DEPAKOTE ER) 24 hr tablet 500 mg  500 mg Oral BID Revonda Humphrey, NP   500 mg at 02/04/22 0919   fluticasone furoate-vilanterol (BREO ELLIPTA) 200-25 MCG/ACT 1 puff  1 puff Inhalation Daily Revonda Humphrey, NP   1 puff at 02/04/22 0742   haloperidol (HALDOL) tablet 10 mg  10 mg Oral TID PRN Revonda Humphrey, NP       hydrOXYzine (ATARAX) tablet 25 mg  25 mg Oral TID PRN Revonda Humphrey, NP   25 mg at 02/01/22 2134   insulin aspart (novoLOG) injection 0-9 Units  0-9 Units Subcutaneous TID WC Revonda Humphrey, NP   1 Units at 02/04/22 0742   insulin glargine-yfgn (SEMGLEE) injection 12 Units  12 Units Subcutaneous Q1200 Revonda Humphrey, NP   12 Units at 02/03/22 1156   latanoprost (XALATAN) 0.005 %  ophthalmic solution 1 drop  1 drop Both Eyes QHS Revonda Humphrey, NP   1 drop at 02/03/22 2133   loperamide (IMODIUM) capsule 2-4 mg  2-4 mg Oral PRN Rankin, Shuvon B, NP   4 mg at 02/03/22 1411   magnesium hydroxide (MILK OF MAGNESIA) suspension 30 mL  30 mL Oral Daily PRN Revonda Humphrey, NP       menthol-cetylpyridinium (CEPACOL) lozenge 3 mg  1 lozenge Oral TID PRN Onuoha, Chinwendu V, NP   3 mg  at 02/04/22 0617   metFORMIN (GLUCOPHAGE-XR) 24 hr tablet 1,000 mg  1,000 mg Oral BID WC Revonda Humphrey, NP   1,000 mg at 02/04/22 5035   nicotine (NICODERM CQ - dosed in mg/24 hr) patch 7 mg  7 mg Transdermal Daily Revonda Humphrey, NP   7 mg at 02/04/22 0919   traZODone (DESYREL) tablet 50 mg  50 mg Oral QHS PRN Revonda Humphrey, NP   50 mg at 02/01/22 2134   valbenazine (INGREZZA) capsule 40 mg  40 mg Oral q AM Revonda Humphrey, NP   40 mg at 02/04/22 4656   Vitamin D (Ergocalciferol) (DRISDOL) 1.25 MG (50000 UNIT) capsule 50,000 Units  50,000 Units Oral Q Sharren Bridge, NP   50,000 Units at 02/04/22 0919   Current Outpatient Medications  Medication Sig Dispense Refill   Accu-Chek Softclix Lancets lancets Use as directed up to four times daily 100 each 0   apixaban (ELIQUIS) 5 MG TABS tablet Take 1 tablet (5 mg total) by mouth 2 (two) times daily. 60 tablet 0   ARIPiprazole (ABILIFY) 10 MG tablet Take 1 tablet (10 mg total) by mouth daily. 30 tablet 0   Blood Glucose Monitoring Suppl (ACCU-CHEK GUIDE) w/Device KIT Use as directed up to four times daily 1 kit 0   budesonide-formoterol (SYMBICORT) 160-4.5 MCG/ACT inhaler Inhale 2 puffs into the lungs in the morning and at bedtime.     Cholecalciferol (VITAMIN D3) 1.25 MG (50000 UT) CAPS Take 50,000 Units by mouth every Thursday.     cloZAPine (CLOZARIL) 25 MG tablet Take 3 tablets (75 mg total) by mouth at bedtime. 90 tablet 0   clozapine (CLOZARIL) 50 MG tablet Take 1 tablet (50 mg total) by mouth daily. 30 tablet 0    diltiazem (CARDIZEM CD) 240 MG 24 hr capsule Take 1 capsule (240 mg total) by mouth daily. (Patient not taking: Reported on 11/24/2021) 30 capsule 0   divalproex (DEPAKOTE ER) 500 MG 24 hr tablet Take 1 tablet (500 mg total) by mouth 2 (two) times daily. 60 tablet 0   glucose blood test strip Use as directed up to four times daily 50 each 0   haloperidol (HALDOL) 10 MG tablet Take 1 tablet (10 mg total) by mouth 3 (three) times daily as needed for agitation (and psychotic symptoms).     INGREZZA 40 MG capsule Take 1 capsule (40 mg total) by mouth in the morning. 30 capsule 0   insulin aspart (NOVOLOG) 100 UNIT/ML FlexPen Before each meal 3 times a day, 140-199 - 2 units, 200-250 - 4 units, 251-299 - 6 units,  300-349 - 8 units,  350 or above 10 units. Insulin PEN if approved, provide syringes and needles if needed.Please switch to any approved short acting Insulin if needed. 15 mL 0   insulin glargine (LANTUS) 100 UNIT/ML Solostar Pen Inject 12 Units into the skin daily. 15 mL 0   Insulin Pen Needle 32G X 4 MM MISC Use 4 times a day with insulin, 1 month supply. 100 each 0   latanoprost (XALATAN) 0.005 % ophthalmic solution Place 1 drop into both eyes at bedtime.     metFORMIN (GLUCOPHAGE) 500 MG tablet Take 500 mg by mouth 2 (two) times daily with a meal.     nicotine (NICODERM CQ - DOSED IN MG/24 HR) 7 mg/24hr patch Place 1 patch (7 mg total) onto the skin daily. 28 patch 0   PROAIR HFA 108 (90 Base) MCG/ACT inhaler  Inhale 2 puffs into the lungs every 6 (six) hours as needed for wheezing or shortness of breath.      Labs  Lab Results:  Admission on 01/24/2022  Component Date Value Ref Range Status   Glucose-Capillary 01/24/2022 143 (H)  70 - 99 mg/dL Final   Glucose reference range applies only to samples taken after fasting for at least 8 hours.   Glucose-Capillary 01/24/2022 120 (H)  70 - 99 mg/dL Final   Glucose reference range applies only to samples taken after fasting for at least 8  hours.   Glucose-Capillary 01/25/2022 122 (H)  70 - 99 mg/dL Final   Glucose reference range applies only to samples taken after fasting for at least 8 hours.   Glucose-Capillary 01/25/2022 190 (H)  70 - 99 mg/dL Final   Glucose reference range applies only to samples taken after fasting for at least 8 hours.   Glucose-Capillary 01/25/2022 163 (H)  70 - 99 mg/dL Final   Glucose reference range applies only to samples taken after fasting for at least 8 hours.   WBC 01/26/2022 6.5  4.0 - 10.5 K/uL Final   RBC 01/26/2022 4.53  3.87 - 5.11 MIL/uL Final   Hemoglobin 01/26/2022 11.3 (L)  12.0 - 15.0 g/dL Final   HCT 01/26/2022 36.4  36.0 - 46.0 % Final   MCV 01/26/2022 80.4  80.0 - 100.0 fL Final   MCH 01/26/2022 24.9 (L)  26.0 - 34.0 pg Final   MCHC 01/26/2022 31.0  30.0 - 36.0 g/dL Final   RDW 01/26/2022 17.3 (H)  11.5 - 15.5 % Final   Platelets 01/26/2022 327  150 - 400 K/uL Final   nRBC 01/26/2022 0.0  0.0 - 0.2 % Final   Neutrophils Relative % 01/26/2022 60  % Final   Neutro Abs 01/26/2022 3.9  1.7 - 7.7 K/uL Final   Lymphocytes Relative 01/26/2022 31  % Final   Lymphs Abs 01/26/2022 2.0  0.7 - 4.0 K/uL Final   Monocytes Relative 01/26/2022 7  % Final   Monocytes Absolute 01/26/2022 0.5  0.1 - 1.0 K/uL Final   Eosinophils Relative 01/26/2022 1  % Final   Eosinophils Absolute 01/26/2022 0.1  0.0 - 0.5 K/uL Final   Basophils Relative 01/26/2022 1  % Final   Basophils Absolute 01/26/2022 0.1  0.0 - 0.1 K/uL Final   Immature Granulocytes 01/26/2022 0  % Final   Abs Immature Granulocytes 01/26/2022 0.02  0.00 - 0.07 K/uL Final   Performed at [facility], [place] [address], [place], [place] [address]   Glucose-Capillary 01/26/2022 149 (H)  70 - 99 mg/dL Final   Glucose reference range applies only to samples taken after fasting for at least 8 hours.   Glucose-Capillary 01/26/2022 130 (H)  70 - 99 mg/dL Final   Glucose reference range applies only to samples taken after fasting for at  least 8 hours.   Glucose-Capillary 01/26/2022 151 (H)  70 - 99 mg/dL Final   Glucose reference range applies only to samples taken after fasting for at least 8 hours.   Glucose-Capillary 01/26/2022 170 (H)  70 - 99 mg/dL Final   Glucose reference range applies only to samples taken after fasting for at least 8 hours.   Glucose-Capillary 01/27/2022 129 (H)  70 - 99 mg/dL Final   Glucose reference range applies only to samples taken after fasting for at least 8 hours.   Glucose-Capillary 01/27/2022 101 (H)  70 - 99 mg/dL Final  Glucose reference range applies only to samples taken after fasting for at least 8 hours.   Glucose-Capillary 01/27/2022 220 (H)  70 - 99 mg/dL Final   Glucose reference range applies only to samples taken after fasting for at least 8 hours.   Glucose-Capillary 01/27/2022 154 (H)  70 - 99 mg/dL Final   Glucose reference range applies only to samples taken after fasting for at least 8 hours.   Glucose-Capillary 01/27/2022 163 (H)  70 - 99 mg/dL Final   Glucose reference range applies only to samples taken after fasting for at least 8 hours.   Glucose-Capillary 01/28/2022 127 (H)  70 - 99 mg/dL Final   Glucose reference range applies only to samples taken after fasting for at least 8 hours.   Glucose-Capillary 01/28/2022 110 (H)  70 - 99 mg/dL Final   Glucose reference range applies only to samples taken after fasting for at least 8 hours.   Glucose-Capillary 01/28/2022 167 (H)  70 - 99 mg/dL Final   Glucose reference range applies only to samples taken after fasting for at least 8 hours.   Glucose-Capillary 01/29/2022 145 (H)  70 - 99 mg/dL Final   Glucose reference range applies only to samples taken after fasting for at least 8 hours.   Glucose-Capillary 01/29/2022 203 (H)  70 - 99 mg/dL Final   Glucose reference range applies only to samples taken after fasting for at least 8 hours.   Glucose-Capillary 01/29/2022 153 (H)  70 - 99 mg/dL Final   Glucose reference  range applies only to samples taken after fasting for at least 8 hours.   Glucose-Capillary 01/29/2022 166 (H)  70 - 99 mg/dL Final   Glucose reference range applies only to samples taken after fasting for at least 8 hours.   Glucose-Capillary 01/30/2022 142 (H)  70 - 99 mg/dL Final   Glucose reference range applies only to samples taken after fasting for at least 8 hours.   Glucose-Capillary 01/30/2022 138 (H)  70 - 99 mg/dL Final   Glucose reference range applies only to samples taken after fasting for at least 8 hours.   Glucose-Capillary 01/30/2022 185 (H)  70 - 99 mg/dL Final   Glucose reference range applies only to samples taken after fasting for at least 8 hours.   Glucose-Capillary 01/30/2022 220 (H)  70 - 99 mg/dL Final   Glucose reference range applies only to samples taken after fasting for at least 8 hours.   Glucose-Capillary 01/31/2022 147 (H)  70 - 99 mg/dL Final   Glucose reference range applies only to samples taken after fasting for at least 8 hours.   Glucose-Capillary 01/31/2022 131 (H)  70 - 99 mg/dL Final   Glucose reference range applies only to samples taken after fasting for at least 8 hours.   Glucose-Capillary 01/31/2022 169 (H)  70 - 99 mg/dL Final   Glucose reference range applies only to samples taken after fasting for at least 8 hours.   Glucose-Capillary 01/31/2022 144 (H)  70 - 99 mg/dL Final   Glucose reference range applies only to samples taken after fasting for at least 8 hours.   Comment 1 01/31/2022 RN AWARE   Final   Glucose-Capillary 02/01/2022 117 (H)  70 - 99 mg/dL Final   Glucose reference range applies only to samples taken after fasting for at least 8 hours.   Glucose-Capillary 02/01/2022 180 (H)  70 - 99 mg/dL Final   Glucose reference range applies only to samples taken after  fasting for at least 8 hours.   Glucose-Capillary 02/01/2022 209 (H)  70 - 99 mg/dL Final   Glucose reference range applies only to samples taken after fasting for at  least 8 hours.   Glucose-Capillary 02/02/2022 131 (H)  70 - 99 mg/dL Final   Glucose reference range applies only to samples taken after fasting for at least 8 hours.   WBC 02/02/2022 7.5  4.0 - 10.5 K/uL Final   RBC 02/02/2022 4.52  3.87 - 5.11 MIL/uL Final   Hemoglobin 02/02/2022 11.3 (L)  12.0 - 15.0 g/dL Final   HCT 02/02/2022 35.0 (L)  36.0 - 46.0 % Final   MCV 02/02/2022 77.4 (L)  80.0 - 100.0 fL Final   MCH 02/02/2022 25.0 (L)  26.0 - 34.0 pg Final   MCHC 02/02/2022 32.3  30.0 - 36.0 g/dL Final   RDW 02/02/2022 16.7 (H)  11.5 - 15.5 % Final   Platelets 02/02/2022 345  150 - 400 K/uL Final   nRBC 02/02/2022 0.0  0.0 - 0.2 % Final   Neutrophils Relative % 02/02/2022 63  % Final   Neutro Abs 02/02/2022 4.7  1.7 - 7.7 K/uL Final   Lymphocytes Relative 02/02/2022 28  % Final   Lymphs Abs 02/02/2022 2.1  0.7 - 4.0 K/uL Final   Monocytes Relative 02/02/2022 7  % Final   Monocytes Absolute 02/02/2022 0.5  0.1 - 1.0 K/uL Final   Eosinophils Relative 02/02/2022 1  % Final   Eosinophils Absolute 02/02/2022 0.1  0.0 - 0.5 K/uL Final   Basophils Relative 02/02/2022 1  % Final   Basophils Absolute 02/02/2022 0.1  0.0 - 0.1 K/uL Final   Immature Granulocytes 02/02/2022 0  % Final   Abs Immature Granulocytes 02/02/2022 0.03  0.00 - 0.07 K/uL Final   Performed at [facility], [place] [address] [place], [place] [address]   Glucose-Capillary 02/02/2022 95  70 - 99 mg/dL Final   Glucose reference range applies only to samples taken after fasting for at least 8 hours.   Glucose-Capillary 02/02/2022 120 (H)  70 - 99 mg/dL Final   Glucose reference range applies only to samples taken after fasting for at least 8 hours.   Glucose-Capillary 02/02/2022 191 (H)  70 - 99 mg/dL Final   Glucose reference range applies only to samples taken after fasting for at least 8 hours.   Glucose-Capillary 02/03/2022 148 (H)  70 - 99 mg/dL Final   Glucose reference range applies only to samples taken after  fasting for at least 8 hours.   Glucose-Capillary 02/03/2022 122 (H)  70 - 99 mg/dL Final   Glucose reference range applies only to samples taken after fasting for at least 8 hours.   Glucose-Capillary 02/03/2022 233 (H)  70 - 99 mg/dL Final   Glucose reference range applies only to samples taken after fasting for at least 8 hours.   Glucose-Capillary 02/04/2022 126 (H)  70 - 99 mg/dL Final   Glucose reference range applies only to samples taken after fasting for at least 8 hours.  Admission on 01/23/2022, Discharged on 01/24/2022  Component Date Value Ref Range Status   Sodium 01/23/2022 140  135 - 145 mmol/L Final   Potassium 01/23/2022 4.4  3.5 - 5.1 mmol/L Final   Chloride 01/23/2022 101  98 - 111 mmol/L Final   CO2 01/23/2022 25  22 - 32 mmol/L Final   Glucose, Bld 01/23/2022 198 (H)  70 - 99 mg/dL Final   Glucose  reference range applies only to samples taken after fasting for at least 8 hours.   BUN 01/23/2022 13  6 - 20 mg/dL Final   Creatinine, Ser 01/23/2022 0.62  0.44 - 1.00 mg/dL Final   Calcium 01/23/2022 9.3  8.9 - 10.3 mg/dL Final   GFR, Estimated 01/23/2022 >60  >60 mL/min Final   Comment: (NOTE) Calculated using the CKD-EPI Creatinine Equation (2021)    Anion gap 01/23/2022 14  5 - 15 Final   Performed at [facility], [place] [address], [place], [place] [address]   WBC 01/23/2022 5.8  4.0 - 10.5 K/uL Final   RBC 01/23/2022 4.63  3.87 - 5.11 MIL/uL Final   Hemoglobin 01/23/2022 11.7 (L)  12.0 - 15.0 g/dL Final   HCT 01/23/2022 37.4  36.0 - 46.0 % Final   MCV 01/23/2022 80.8  80.0 - 100.0 fL Final   MCH 01/23/2022 25.3 (L)  26.0 - 34.0 pg Final   MCHC 01/23/2022 31.3  30.0 - 36.0 g/dL Final   RDW 01/23/2022 17.9 (H)  11.5 - 15.5 % Final   Platelets 01/23/2022 333  150 - 400 K/uL Final   nRBC 01/23/2022 0.0  0.0 - 0.2 % Final   Performed at [facility] [address] [place], [place] [address]   Troponin I (High Sensitivity) 01/23/2022 6  <18 ng/L Final    Comment: (NOTE) Elevated high sensitivity troponin I (hsTnI) values and significant  changes across serial measurements may suggest ACS but many other  chronic and acute conditions are known to elevate hsTnI results.  Refer to the "Links" section for chest pain algorithms and additional  guidance. Performed at [facility], [place] [address] [place], [place] [address]    Troponin I (High Sensitivity) 01/23/2022 5  <18 ng/L Final   Comment: (NOTE) Elevated high sensitivity troponin I (hsTnI) values and significant  changes across serial measurements may suggest ACS but many other  chronic and acute conditions are known to elevate hsTnI results.  Refer to the "Links" section for chest pain algorithms and additional  guidance. Performed at [facility], [place] [address] [place], [place] [address]    SARS Coronavirus 2 by RT PCR 01/23/2022 NEGATIVE  NEGATIVE Final   Comment: (NOTE) SARS-CoV-2 target nucleic acids are NOT DETECTED.  The SARS-CoV-2 RNA is generally detectable in upper and lower respiratory specimens during the acute phase of infection. The lowest concentration of SARS-CoV-2 viral copies this assay can detect is 250 copies / mL. A negative result does not preclude SARS-CoV-2 infection and should not be used as the sole basis for treatment or other patient management decisions.  A negative result may occur with improper specimen collection / handling, submission of specimen other than nasopharyngeal swab, presence of viral mutation(s) within the areas targeted by this assay, and inadequate number of viral copies (<250 copies / mL). A negative result must be combined with clinical observations, patient history, and epidemiological information.  Fact Sheet for Patients:   https://www.patel.info/  Fact Sheet for Healthcare Providers: https://hall.com/  This test is not yet approved or                           cleared  by the [place] FDA and has been authorized for detection and/or diagnosis of SARS-CoV-2 by FDA under an Emergency Use Authorization (EUA).  This EUA will remain in effect (meaning this test can be used) for  the duration of the COVID-19 declaration under Section 564(b)(1) of the Act, 21 U.S.C. section 360bbb-3(b)(1), unless the authorization is terminated or revoked sooner.  Performed at [facility], [place] [address] [place], [place] [address]    B Natriuretic Peptide 01/23/2022 41.3  0.0 - 100.0 pg/mL Final   Performed at [facility] [address], [place], [place] [address]   Group A Strep by PCR 01/23/2022 NOT DETECTED  NOT DETECTED Final   Performed at [facility], [place] [address], [place], [place] [address]   Color, Urine 01/23/2022 YELLOW  YELLOW Final   APPearance 01/23/2022 CLEAR  CLEAR Final   Specific Gravity, Urine 01/23/2022 1.018  1.005 - 1.030 Final   pH 01/23/2022 7.0  5.0 - 8.0 Final   Glucose, UA 01/23/2022 NEGATIVE  NEGATIVE mg/dL Final   Hgb urine dipstick 01/23/2022 NEGATIVE  NEGATIVE Final   Bilirubin Urine 01/23/2022 NEGATIVE  NEGATIVE Final   Ketones, ur 01/23/2022 NEGATIVE  NEGATIVE mg/dL Final   Protein, ur 01/23/2022 NEGATIVE  NEGATIVE mg/dL Final   Nitrite 01/23/2022 NEGATIVE  NEGATIVE Final   Leukocytes,Ua 01/23/2022 TRACE (A)  NEGATIVE Final   RBC / HPF 01/23/2022 0-5  0 - 5 RBC/hpf Final   WBC, UA 01/23/2022 0-5  0 - 5 WBC/hpf Final   Bacteria, UA 01/23/2022 NONE SEEN  NONE SEEN Final   Squamous Epithelial / LPF 01/23/2022 0-5  0 - 5 Final   Mucus 01/23/2022 PRESENT   Final   Performed at [facility], [place] [address] [place], [place] [id number]   SARS Coronavirus 2 by RT PCR 01/23/2022 NEGATIVE  NEGATIVE Final   Comment: (NOTE) SARS-CoV-2 target nucleic acids are NOT DETECTED.  The SARS-CoV-2 RNA is generally detectable in upper respiratory specimens during the acute phase of infection. The lowest concentration of  SARS-CoV-2 viral copies this assay can detect is 138 copies/mL. A negative result does not preclude SARS-Cov-2 infection and should not be used as the sole basis for treatment or other patient management decisions. A negative result may occur with  improper specimen collection/handling, submission of specimen other than nasopharyngeal swab, presence of viral mutation(s) within the areas targeted by this assay, and inadequate number of viral copies(<138 copies/mL). A negative result must be combined with clinical observations, patient history, and epidemiological information. The expected result is Negative.  Fact Sheet for Patients:  EntrepreneurPulse.com.au  Fact Sheet for Healthcare Providers:  IncredibleEmployment.be  This test is no                          t yet approved or cleared by the [place] FDA and  has been authorized for detection and/or diagnosis of SARS-CoV-2 by FDA under an Emergency Use Authorization (EUA). This EUA will remain  in effect (meaning this test can be used) for the duration of the COVID-19 declaration under Section 564(b)(1) of the Act, 21 U.S.C.section 360bbb-3(b)(1), unless the authorization is terminated  or revoked sooner.       Influenza A by PCR 01/23/2022 NEGATIVE  NEGATIVE Final   Influenza B by PCR 01/23/2022 NEGATIVE  NEGATIVE Final   Comment: (NOTE) The Xpert Xpress SARS-CoV-2/FLU/RSV plus assay is intended as an aid in the diagnosis of influenza from Nasopharyngeal swab specimens and should not be used as a sole basis for treatment. Nasal washings and aspirates are unacceptable for Xpert Xpress SARS-CoV-2/FLU/RSV testing.  Fact Sheet for Patients: EntrepreneurPulse.com.au  Fact Sheet  for Healthcare Providers: IncredibleEmployment.be  This test is not yet approved or cleared by the [place] and has been authorized for detection and/or diagnosis of  SARS-CoV-2 by FDA under an Emergency Use Authorization (EUA). This EUA will remain in effect (meaning this test can be used) for the duration of the COVID-19 declaration under Section 564(b)(1) of the Act, 21 U.S.C. section 360bbb-3(b)(1), unless the authorization is terminated or revoked.  Performed at [facility], [place] [address] [place], [place] [address]    WBC 01/24/2022 6.1  4.0 - 10.5 K/uL Final   RBC 01/24/2022 4.60  3.87 - 5.11 MIL/uL Final   Hemoglobin 01/24/2022 11.7 (L)  12.0 - 15.0 g/dL Final   HCT 01/24/2022 37.0  36.0 - 46.0 % Final   MCV 01/24/2022 80.4  80.0 - 100.0 fL Final   MCH 01/24/2022 25.4 (L)  26.0 - 34.0 pg Final   MCHC 01/24/2022 31.6  30.0 - 36.0 g/dL Final   RDW 01/24/2022 17.6 (H)  11.5 - 15.5 % Final   Platelets 01/24/2022 334  150 - 400 K/uL Final   nRBC 01/24/2022 0.0  0.0 - 0.2 % Final   Neutrophils Relative % 01/24/2022 53  % Final   Neutro Abs 01/24/2022 3.3  1.7 - 7.7 K/uL Final   Lymphocytes Relative 01/24/2022 33  % Final   Lymphs Abs 01/24/2022 2.0  0.7 - 4.0 K/uL Final   Monocytes Relative 01/24/2022 11  % Final   Monocytes Absolute 01/24/2022 0.7  0.1 - 1.0 K/uL Final   Eosinophils Relative 01/24/2022 2  % Final   Eosinophils Absolute 01/24/2022 0.1  0.0 - 0.5 K/uL Final   Basophils Relative 01/24/2022 1  % Final   Basophils Absolute 01/24/2022 0.1  0.0 - 0.1 K/uL Final   Immature Granulocytes 01/24/2022 0  % Final   Abs Immature Granulocytes 01/24/2022 0.02  0.00 - 0.07 K/uL Final   Performed at [facility], [place] [address], [place], [place] [address]   Glucose-Capillary 01/24/2022 110 (H)  70 - 99 mg/dL Final   Glucose reference range applies only to samples taken after fasting for at least 8 hours.  No results displayed because visit has over 200 results.    Admission on 11/22/2021, Discharged on 11/22/2021  Component Date Value Ref Range Status   Glucose-Capillary 11/22/2021 169 (H)  70 - 99 mg/dL Final   Glucose  reference range applies only to samples taken after fasting for at least 8 hours.  No results displayed because visit has over 200 results.    Admission on 10/04/2021, Discharged on 10/22/2021  Component Date Value Ref Range Status   SARS Coronavirus 2 by RT PCR 10/04/2021 NEGATIVE  NEGATIVE Final   Comment: (NOTE) SARS-CoV-2 target nucleic acids are NOT DETECTED.  The SARS-CoV-2 RNA is generally detectable in upper respiratory specimens during the acute phase of infection. The lowest concentration of SARS-CoV-2 viral copies this assay can detect is 138 copies/mL. A negative result does not preclude SARS-Cov-2 infection and should not be used as the sole basis for treatment or other patient management decisions. A negative result may occur with  improper specimen collection/handling, submission of specimen other than nasopharyngeal swab, presence of viral mutation(s) within the areas targeted by this assay, and inadequate number of viral copies(<138 copies/mL). A negative result must be combined with clinical observations, patient history, and epidemiological information. The expected result is Negative.  Fact Sheet for Patients:  EntrepreneurPulse.com.au  Fact Sheet for Healthcare  Providers:  IncredibleEmployment.be  This test is no                          t yet approved or cleared by the [place] and  has been authorized for detection and/or diagnosis of SARS-CoV-2 by FDA under an Emergency Use Authorization (EUA). This EUA will remain  in effect (meaning this test can be used) for the duration of the COVID-19 declaration under Section 564(b)(1) of the Act, 21 U.S.C.section 360bbb-3(b)(1), unless the authorization is terminated  or revoked sooner.       Influenza A by PCR 10/04/2021 NEGATIVE  NEGATIVE Final   Influenza B by PCR 10/04/2021 NEGATIVE  NEGATIVE Final   Comment: (NOTE) The Xpert Xpress SARS-CoV-2/FLU/RSV plus assay  is intended as an aid in the diagnosis of influenza from Nasopharyngeal swab specimens and should not be used as a sole basis for treatment. Nasal washings and aspirates are unacceptable for Xpert Xpress SARS-CoV-2/FLU/RSV testing.  Fact Sheet for Patients: EntrepreneurPulse.com.au  Fact Sheet for Healthcare Providers: IncredibleEmployment.be  This test is not yet approved or cleared by the [place] FDA and has been authorized for detection and/or diagnosis of SARS-CoV-2 by FDA under an Emergency Use Authorization (EUA). This EUA will remain in effect (meaning this test can be used) for the duration of the COVID-19 declaration under Section 564(b)(1) of the Act, 21 U.S.C. section 360bbb-3(b)(1), unless the authorization is terminated or revoked.  Performed at [facility], [place] [address] [place], [place] [address]    WBC 10/04/2021 8.4  4.0 - 10.5 K/uL Final   RBC 10/04/2021 4.42  3.87 - 5.11 MIL/uL Final   Hemoglobin 10/04/2021 11.6 (L)  12.0 - 15.0 g/dL Final   HCT 10/04/2021 35.7 (L)  36.0 - 46.0 % Final   MCV 10/04/2021 80.8  80.0 - 100.0 fL Final   MCH 10/04/2021 26.2  26.0 - 34.0 pg Final   MCHC 10/04/2021 32.5  30.0 - 36.0 g/dL Final   RDW 10/04/2021 16.0 (H)  11.5 - 15.5 % Final   Platelets 10/04/2021 372  150 - 400 K/uL Final   nRBC 10/04/2021 0.0  0.0 - 0.2 % Final   Neutrophils Relative % 10/04/2021 68  % Final   Neutro Abs 10/04/2021 5.7  1.7 - 7.7 K/uL Final   Lymphocytes Relative 10/04/2021 22  % Final   Lymphs Abs 10/04/2021 1.8  0.7 - 4.0 K/uL Final   Monocytes Relative 10/04/2021 8  % Final   Monocytes Absolute 10/04/2021 0.7  0.1 - 1.0 K/uL Final   Eosinophils Relative 10/04/2021 1  % Final   Eosinophils Absolute 10/04/2021 0.1  0.0 - 0.5 K/uL Final   Basophils Relative 10/04/2021 1  % Final   Basophils Absolute 10/04/2021 0.1  0.0 - 0.1 K/uL Final   Immature Granulocytes 10/04/2021 0  % Final   Abs Immature  Granulocytes 10/04/2021 0.03  0.00 - 0.07 K/uL Final   Performed at [facility], [place] [address] [place], [place] [address]   Sodium 10/04/2021 136  135 - 145 mmol/L Final   Potassium 10/04/2021 4.2  3.5 - 5.1 mmol/L Final   Chloride 10/04/2021 104  98 - 111 mmol/L Final   CO2 10/04/2021 25  22 - 32 mmol/L Final   Glucose, Bld 10/04/2021 100 (H)  70 - 99 mg/dL Final   Glucose reference range applies only to samples taken after fasting for at least 8 hours.  BUN 10/04/2021 9  6 - 20 mg/dL Final   Creatinine, Ser 10/04/2021 0.52  0.44 - 1.00 mg/dL Final   Calcium 10/04/2021 9.0  8.9 - 10.3 mg/dL Final   Total Protein 10/04/2021 7.0  6.5 - 8.1 g/dL Final   Albumin 10/04/2021 3.2 (L)  3.5 - 5.0 g/dL Final   AST 10/04/2021 13 (L)  15 - 41 U/L Final   ALT 10/04/2021 10  0 - 44 U/L Final   Alkaline Phosphatase 10/04/2021 61  38 - 126 U/L Final   Total Bilirubin 10/04/2021 0.3  0.3 - 1.2 mg/dL Final   GFR, Estimated 10/04/2021 >60  >60 mL/min Final   Comment: (NOTE) Calculated using the CKD-EPI Creatinine Equation (2021)    Anion gap 10/04/2021 7  5 - 15 Final   Performed at [place] [address], [place], [place] [address]   Hgb A1c MFr Bld 10/04/2021 7.0 (H)  4.8 - 5.6 % Final   Comment: (NOTE) Pre diabetes:          5.7%-6.4%  Diabetes:              >6.4%  Glycemic control for   <7.0% adults with diabetes    Mean Plasma Glucose 10/04/2021 154.2  mg/dL Final   Performed at [facility], [place] [address] [place], [place] [address]   Cholesterol 10/04/2021 178  0 - 200 mg/dL Final   Triglycerides 10/04/2021 155 (H)  <150 mg/dL Final   HDL 10/04/2021 52  >40 mg/dL Final   Total CHOL/HDL Ratio 10/04/2021 3.4  RATIO Final   VLDL 10/04/2021 31  0 - 40 mg/dL Final   LDL Cholesterol 10/04/2021 95  0 - 99 mg/dL Final   Comment:        Total Cholesterol/HDL:CHD Risk Coronary Heart Disease Risk Table                     Men   Women  1/2 Average Risk   3.4   3.3   Average Risk       5.0   4.4  2 X Average Risk   9.6   7.1  3 X Average Risk  23.4   11.0        Use the calculated Patient Ratio above and the CHD Risk Table to determine the patient's CHD Risk.        ATP III CLASSIFICATION (LDL):  <100     mg/dL   Optimal  100-129  mg/dL   Near or Above                    Optimal  130-159  mg/dL   Borderline  160-189  mg/dL   High  >190     mg/dL   Very High Performed at [place] [address], [place], [place] [address]    POC Amphetamine UR 10/04/2021 None Detected  NONE DETECTED (Cut Off Level 1000 ng/mL) Final   POC Secobarbital (BAR) 10/04/2021 None Detected  NONE DETECTED (Cut Off Level 300 ng/mL) Final   POC Buprenorphine (BUP) 10/04/2021 None Detected  NONE DETECTED (Cut Off Level 10 ng/mL) Final   POC Oxazepam (BZO) 10/04/2021 None Detected  NONE DETECTED (Cut Off Level 300 ng/mL) Final   POC Cocaine UR 10/04/2021 None Detected  NONE DETECTED (Cut Off Level 300 ng/mL) Final   POC Methamphetamine UR 10/04/2021 None Detected  NONE DETECTED (Cut Off Level 1000 ng/mL) Final  POC Morphine 10/04/2021 None Detected  NONE DETECTED (Cut Off Level 300 ng/mL) Final   POC Methadone UR 10/04/2021 None Detected  NONE DETECTED (Cut Off Level 300 ng/mL) Final   POC Oxycodone UR 10/04/2021 Positive (A)  NONE DETECTED (Cut Off Level 100 ng/mL) Final   POC Marijuana UR 10/04/2021 None Detected  NONE DETECTED (Cut Off Level 50 ng/mL) Final   SARSCOV2ONAVIRUS 2 AG 10/04/2021 NEGATIVE  NEGATIVE Final   Comment: (NOTE) SARS-CoV-2 antigen NOT DETECTED.   Negative results are presumptive.  Negative results do not preclude SARS-CoV-2 infection and should not be used as the sole basis for treatment or other patient management decisions, including infection  control decisions, particularly in the presence of clinical signs and  symptoms consistent with COVID-19, or in those who have been in contact with the virus.  Negative results must be combined  with clinical observations, patient history, and epidemiological information. The expected result is Negative.  Fact Sheet for Patients: HandmadeRecipes.com.cy  Fact Sheet for Healthcare Providers: FuneralLife.at  This test is not yet approved or cleared by the [place] FDA and  has been authorized for detection and/or diagnosis of SARS-CoV-2 by FDA under an Emergency Use Authorization (EUA).  This EUA will remain in effect (meaning this test can be used) for the duration of  the COV                          ID-19 declaration under Section 564(b)(1) of the Act, 21 U.S.C. section 360bbb-3(b)(1), unless the authorization is terminated or revoked sooner.     Valproic Acid Lvl 10/04/2021 51  50.0 - 100.0 ug/mL Final   Performed at [facility] [address], [place], [place] [address]   Valproic Acid Lvl 10/08/2021 57  50.0 - 100.0 ug/mL Final   Performed at [facility] [address] [place], [place] [address]   Glucose-Capillary 10/09/2021 123 (H)  70 - 99 mg/dL Final   Glucose reference range applies only to samples taken after fasting for at least 8 hours.  Admission on 09/09/2021, Discharged on 09/10/2021  Component Date Value Ref Range Status   Sodium 09/09/2021 134 (L)  135 - 145 mmol/L Final   Potassium 09/09/2021 4.3  3.5 - 5.1 mmol/L Final   Chloride 09/09/2021 99  98 - 111 mmol/L Final   CO2 09/09/2021 25  22 - 32 mmol/L Final   Glucose, Bld 09/09/2021 107 (H)  70 - 99 mg/dL Final   Glucose reference range applies only to samples taken after fasting for at least 8 hours.   BUN 09/09/2021 12  6 - 20 mg/dL Final   Creatinine, Ser 09/09/2021 0.74  0.44 - 1.00 mg/dL Final   Calcium 09/09/2021 9.0  8.9 - 10.3 mg/dL Final   Total Protein 09/09/2021 7.4  6.5 - 8.1 g/dL Final   Albumin 09/09/2021 3.1 (L)  3.5 - 5.0 g/dL Final   AST 09/09/2021 13 (L)  15 - 41 U/L Final   ALT 09/09/2021 13  0 - 44 U/L Final    Alkaline Phosphatase 09/09/2021 66  38 - 126 U/L Final   Total Bilirubin 09/09/2021 0.2 (L)  0.3 - 1.2 mg/dL Final   GFR, Estimated 09/09/2021 >60  >60 mL/min Final   Comment: (NOTE) Calculated using the CKD-EPI Creatinine Equation (2021)    Anion gap 09/09/2021 10  5 - 15 Final   Performed at [facility], [place]  [address], [place], [place] [address]   WBC 09/09/2021 8.5  4.0 - 10.5 K/uL Final   RBC 09/09/2021 4.53  3.87 - 5.11 MIL/uL Final   Hemoglobin 09/09/2021 11.8 (L)  12.0 - 15.0 g/dL Final   HCT 09/09/2021 38.0  36.0 - 46.0 % Final   MCV 09/09/2021 83.9  80.0 - 100.0 fL Final   MCH 09/09/2021 26.0  26.0 - 34.0 pg Final   MCHC 09/09/2021 31.1  30.0 - 36.0 g/dL Final   RDW 09/09/2021 17.8 (H)  11.5 - 15.5 % Final   Platelets 09/09/2021 442 (H)  150 - 400 K/uL Final   nRBC 09/09/2021 0.0  0.0 - 0.2 % Final   Neutrophils Relative % 09/09/2021 70  % Final   Neutro Abs 09/09/2021 5.9  1.7 - 7.7 K/uL Final   Lymphocytes Relative 09/09/2021 20  % Final   Lymphs Abs 09/09/2021 1.7  0.7 - 4.0 K/uL Final   Monocytes Relative 09/09/2021 8  % Final   Monocytes Absolute 09/09/2021 0.7  0.1 - 1.0 K/uL Final   Eosinophils Relative 09/09/2021 1  % Final   Eosinophils Absolute 09/09/2021 0.1  0.0 - 0.5 K/uL Final   Basophils Relative 09/09/2021 1  % Final   Basophils Absolute 09/09/2021 0.1  0.0 - 0.1 K/uL Final   Immature Granulocytes 09/09/2021 0  % Final   Abs Immature Granulocytes 09/09/2021 0.03  0.00 - 0.07 K/uL Final   Performed at [facility], [place] [address] [place], [place] [address]   Ammonia 09/09/2021 37 (H)  9 - 35 umol/L Final   Comment: HEMOLYSIS AT THIS LEVEL MAY AFFECT RESULT Performed at [facility], [place] [address], [place], [place] [address]    Opiates 09/09/2021 NONE DETECTED  NONE DETECTED Final   Cocaine 09/09/2021 NONE DETECTED  NONE DETECTED Final   Benzodiazepines 09/09/2021 NONE DETECTED  NONE DETECTED Final   Amphetamines 09/09/2021 NONE  DETECTED  NONE DETECTED Final   Tetrahydrocannabinol 09/09/2021 NONE DETECTED  NONE DETECTED Final   Barbiturates 09/09/2021 NONE DETECTED  NONE DETECTED Final   Comment: (NOTE) DRUG SCREEN FOR MEDICAL PURPOSES ONLY.  IF CONFIRMATION IS NEEDED FOR ANY PURPOSE, NOTIFY LAB WITHIN 5 DAYS.  LOWEST DETECTABLE LIMITS FOR URINE DRUG SCREEN Drug Class                     Cutoff (ng/mL) Amphetamine and metabolites    1000 Barbiturate and metabolites    200 Benzodiazepine                 010 Tricyclics and metabolites     300 Opiates and metabolites        300 Cocaine and metabolites        300 THC                            50 Performed at [facility], [place] [address], [place], [place] [address]    Alcohol, Ethyl (B) 09/09/2021 <10  <10 mg/dL Final   Comment: (NOTE) Lowest detectable limit for serum alcohol is 10 mg/dL.  For medical purposes only. Performed at [facility], [place] [address], [place], [place] [address]    Lipase 09/09/2021 33  11 - 51 U/L Final   Performed at [facility] [address] [place], [place] [address]   Color, Urine 09/09/2021 COLORLESS (A)  YELLOW Final   APPearance 09/09/2021  CLEAR  CLEAR Final   Specific Gravity, Urine 09/09/2021 1.002 (L)  1.005 - 1.030 Final   pH 09/09/2021 6.0  5.0 - 8.0 Final   Glucose, UA 09/09/2021 NEGATIVE  NEGATIVE mg/dL Final   Hgb urine dipstick 09/09/2021 NEGATIVE  NEGATIVE Final   Bilirubin Urine 09/09/2021 NEGATIVE  NEGATIVE Final   Ketones, ur 09/09/2021 NEGATIVE  NEGATIVE mg/dL Final   Protein, ur 09/09/2021 NEGATIVE  NEGATIVE mg/dL Final   Nitrite 09/09/2021 NEGATIVE  NEGATIVE Final   Leukocytes,Ua 09/09/2021 NEGATIVE  NEGATIVE Final   Performed at [facility], [place] [address], [place], [place] [address]   Specimen Description 09/09/2021 URINE, CLEAN CATCH   Final   Special Requests 09/09/2021 NONE   Final   Culture 09/09/2021  (A)   Final                   Value:<10,000  COLONIES/mL INSIGNIFICANT GROWTH Performed at [facility], [place] [address], [place], [place] [address]    Report Status 09/09/2021 09/10/2021 FINAL   Final   D-Dimer, Quant 09/09/2021 0.92 (H)  0.00 - 0.50 ug/mL-FEU Final   Comment: (NOTE) At the manufacturer cut-off value of 0.5 g/mL FEU, this assay has a negative predictive value of 95-100%.This assay is intended for use in conjunction with a clinical pretest probability (PTP) assessment model to exclude pulmonary embolism (PE) and deep venous thrombosis (DVT) in outpatients suspected of PE or DVT. Results should be correlated with clinical presentation. Performed at [facility], [place] [address] [place], [place] [address]    Troponin I (High Sensitivity) 09/09/2021 5  <18 ng/L Final   Comment: (NOTE) Elevated high sensitivity troponin I (hsTnI) values and significant  changes across serial measurements may suggest ACS but many other  chronic and acute conditions are known to elevate hsTnI results.  Refer to the "Links" section for chest pain algorithms and additional  guidance. Performed at [facility], [place] [address], [place],  [address]    BP 09/10/2021 135/83  mmHg Final   S' Lateral 09/10/2021 2.30  cm Final   Area-P 1/2 09/10/2021 5.58  cm2 Final    Blood Alcohol level:  Lab Results  Component Value Date   ETH <10 11/23/2021   ETH <10 34/19/6222    Metabolic Disorder Labs: Lab Results  Component Value Date   HGBA1C 6.7 (H) 12/28/2021   MPG 145.59 12/28/2021   MPG 154.2 10/04/2021   Lab Results  Component Value Date   PROLACTIN 3.8 (L) 11/23/2021   Lab Results  Component Value Date   CHOL 171 11/23/2021   TRIG 125 11/23/2021   HDL 65 11/23/2021   CHOLHDL 2.6 11/23/2021   VLDL 25 11/23/2021   LDLCALC 81 11/23/2021   LDLCALC 95 10/04/2021    Therapeutic Lab Levels: No results found for: "LITHIUM" Lab Results  Component Value Date   VALPROATE 33 (L) 01/18/2022   VALPROATE  33 (L) 12/28/2021   No results found for: "CBMZ"  Physical Findings   PHQ2-9    Flowsheet Row ED from 11/23/2021 in [facility]  PHQ-2 Total Score 2  PHQ-9 Total Score 3      Flowsheet Row ED from 01/23/2022 in [facility] from 11/23/2021 in [facility] ED from 11/22/2021 in [facility] DEPT  C-SSRS RISK CATEGORY No Risk No Risk No Risk        Musculoskeletal  Strength & Muscle Tone: within normal limits Gait & Station: normal Patient leans: N/A  Psychiatric Specialty Exam  Presentation  General Appearance:  Appropriate for Environment; Casual  Eye Contact: Good  Speech: Clear and Coherent; Normal Rate  Speech Volume: Normal  Handedness: Right   Mood and Affect  Mood: Euthymic  Affect: Appropriate; Congruent   Thought Process  Thought Processes: Coherent; Goal Directed; Linear  Descriptions of Associations:Intact  Orientation:Full (Time, Place and Person)  Thought Content:Logical; WDL  Diagnosis of Schizophrenia or Schizoaffective disorder in past: Yes  Duration of Psychotic Symptoms: No data recorded  Hallucinations:Hallucinations: None  Ideas of Reference:None  Suicidal Thoughts:Suicidal Thoughts: No  Homicidal Thoughts:Homicidal Thoughts: No   Sensorium  Memory: Immediate Good; Recent Good  Judgment: Intact  Insight: Present   Executive Functions  Concentration: Good  Attention Span: Good  Recall: Good  Fund of Knowledge: Good  Language: Good   Psychomotor Activity  Psychomotor Activity: Psychomotor Activity: Normal   Assets  Assets: Communication Skills; Desire for Improvement; Resilience   Sleep  Sleep: Sleep: Good   Nutritional Assessment (For OBS and FBC admissions only) Has the patient had a weight loss or gain of 10 pounds or more in the last 3 months?: No Has the  patient had a decrease in food intake/or appetite?: No Does the patient have dental problems?: No Does the patient have eating habits or behaviors that may be indicators of an eating disorder including binging or inducing vomiting?: No Has the patient recently lost weight without trying?: 0 Has the patient been eating poorly because of a decreased appetite?: 0 Malnutrition Screening Tool Score: 0    Physical Exam  Physical Exam Vitals and nursing note reviewed.  Constitutional:      Appearance: Normal appearance. She is well-developed.  HENT:     Head: Normocephalic and atraumatic.     Nose: Nose normal.  Cardiovascular:     Rate and Rhythm: Normal rate.  Pulmonary:     Effort: Pulmonary effort is normal.  Musculoskeletal:        General: Normal range of motion.     Cervical back: Normal range of motion.  Skin:    General: Skin is warm and dry.  Neurological:     Mental Status: She is alert and oriented to person, place, and time.  Psychiatric:        Attention and Perception: Attention and perception normal.        Mood and Affect: Mood and affect normal.        Speech: Speech normal.        Behavior: Behavior normal. Behavior is cooperative.        Thought Content: Thought content normal.        Cognition and Memory: Cognition normal.    Review of Systems  Constitutional: Negative.   HENT: Negative.    Eyes: Negative.   Respiratory: Negative.    Cardiovascular: Negative.   Gastrointestinal: Negative.   Genitourinary: Negative.   Musculoskeletal: Negative.   Skin: Negative.   Neurological: Negative.   Psychiatric/Behavioral: Negative.     Blood pressure (!) 140/90, pulse 99, temperature 97.7 F (36.5 C), temperature source Oral, resp. rate 20, SpO2 95 %. There is no height or weight on file to calculate BMI.  Treatment Plan Summary: Patient reviewed with Dr. Hampton Abbot.  Patient remains cleared by psychiatry. Daily contact with patient to assess and evaluate  symptoms and progress in treatment  Lucky Rathke, FNP 02/04/2022 10:13 AM

## 2022-02-04 NOTE — ED Notes (Signed)
With Child psychotherapist

## 2022-02-04 NOTE — ED Notes (Signed)
Pt sleeping at present, no distress noted.  Monitoring for safety. 

## 2022-02-04 NOTE — ED Notes (Signed)
Pt accepted PO meds. Insuling per ISS and Breo w/o difficulty. Breakfast provided. Pt currently sitting on pull out bed/chair in no distress. Safety maintained and will continue to monitor.

## 2022-02-04 NOTE — ED Notes (Signed)
Pt A&O x 4, no distress noted.  Resting at present, monitoring for safety. ?

## 2022-02-04 NOTE — ED Notes (Signed)
Pt A&O x 4, no distress noted, watching TV at present.  Monitoring for safety. 

## 2022-02-05 DIAGNOSIS — F209 Schizophrenia, unspecified: Secondary | ICD-10-CM | POA: Diagnosis not present

## 2022-02-05 LAB — GLUCOSE, CAPILLARY
Glucose-Capillary: 170 mg/dL — ABNORMAL HIGH (ref 70–99)
Glucose-Capillary: 107 mg/dL — ABNORMAL HIGH (ref 70–99)
Glucose-Capillary: 184 mg/dL — ABNORMAL HIGH (ref 70–99)
Glucose-Capillary: 256 mg/dL — ABNORMAL HIGH (ref 70–99)

## 2022-02-05 NOTE — ED Notes (Signed)
Pt A&O x 4, no distress noted, resting at present.  Monitoring for safety. ?

## 2022-02-05 NOTE — ED Notes (Signed)
Pt was given [employer] for lunch  

## 2022-02-05 NOTE — ED Notes (Signed)
Teresa Murillo has been woke all night.

## 2022-02-05 NOTE — ED Notes (Signed)
Received patient this AM. Patient is showered, dressed, and sitting on the side of the bed. Patient respirations are even and unlabored. Will continue to monitor for safety.

## 2022-02-05 NOTE — ED Notes (Signed)
Patient

## 2022-02-05 NOTE — ED Notes (Signed)
Pt awake & resting at present, no distress noted.  Monitoring for safety. 

## 2022-02-05 NOTE — ED Provider Notes (Signed)
Behavioral Health Progress Note  Date and Time: 02/05/2022 9:32 AM Name: Teresa Murillo MRN:  [id number]  Subjective: Patient states did not get much rest last night, there was a lot of snoring."  Patient is reassessed, face-to-face, by nurse practitioner.  She is seated in observation area upon my approach, no apparent distress.  She remains alert and oriented, pleasant and cooperative.  She presents with anxious mood.  Teresa Murillo continues to deny suicidal and homicidal ideation.  She denies auditory and visual hallucinations.  She presents with tangential thought content today, reviews her previous group home and the fact that "she does not want me there anymore, her life is moved on."  [facility] discusses potential new group home placement.  She believes she may be transitioning to a new group home in about 2 weeks.  Patient offered support and encouragement.  She remains cleared by psychiatry.  She verbalizes understanding of treatment plan to include awaiting disposition.  Per chart review: Teresa Murillo 61 y.o., female patient with a past psychiatric history of schizophrenia, aggressive behavior, and borderline intellectual disability who initially presented to [facility] on 11/23/2021 via [facility].  At that time patient had been dismissed from the group home due to multiple elopements.  Patient has remained at the [facility] while awaiting new placement.  Social work and DSS are involved.   Diagnosis:  Final diagnoses:  Schizophrenia, unspecified type (Teresa Murillo)  At risk for self care deficit  Noncompliance  Type 2 diabetes mellitus without complication, unspecified whether long term insulin use (Teresa Murillo)    Total Time spent with patient: 20 minutes  Past Psychiatric History: see H & P Past Medical History:  Past Medical History:  Diagnosis Date   Borderline intellectual functioning 12/14/2021   On 12/14/2021: Appreciate assistance from psychology consult. On the Wechsler Adult Intelligence Scale-4, [name] achieved a full-scale IQ score of 73 and a percentile rank of 4 placing her in the borderline range of intellectual functioning.    Chronic obstructive pulmonary disease (COPD) (HCC)    Glaucoma    Hyperlipidemia    Hypertension    Iron deficiency    Schizoaffective disorder (HCC)    Type 2 diabetes mellitus (HCC)     Past Surgical History:  Procedure Laterality Date   TUBAL LIGATION     Family History:  Family History  Problem Relation Age of Onset   Breast cancer Maternal Grandmother    Family Psychiatric  History: see H & P Social History:  Social History   Substance and Sexual Activity  Alcohol Use Yes     Social History   Substance and Sexual Activity  Drug Use Not Currently    Social History   Socioeconomic History   Marital status: Divorced    Spouse name: Not on file   Number of children: Not on file   Years of education: Not on file   Highest education level: Not on file  Occupational History   Not on file  Tobacco Use   Smoking status: Every Day    Packs/day: 1.00    Types: Cigars, Cigarettes   Smokeless tobacco: Current  Vaping Use   Vaping Use: Never used  Substance and Sexual Activity   Alcohol use: Yes   Drug use: Not Currently   Sexual activity: Not Currently    Birth control/protection: Surgical  Other Topics Concern   Not on file  Social History Narrative   Not on file   Social Determinants of Health   Financial  Resource Strain: Not on file  Food Insecurity: Not on file  Transportation Needs: Not on file  Physical Activity: Not on file  Stress: Not on file  Social Connections: Not on file   SDOH:  SDOH Screenings   Depression (PHQ2-9): Low Risk  (11/23/2021)  Tobacco Use: High Risk (02/03/2022)   Additional Social History:                         Sleep: Poor  Appetite:  Fair  Current Medications:  Current Facility-Administered Medications  Medication Dose Route Frequency Provider Last Rate Last Admin    acetaminophen (TYLENOL) tablet 650 mg  650 mg Oral Q6H PRN Revonda Humphrey, NP   650 mg at 02/04/22 2111   albuterol (VENTOLIN HFA) 108 (90 Base) MCG/ACT inhaler 2 puff  2 puff Inhalation Q6H PRN Revonda Humphrey, NP   2 puff at 02/05/22 0642   alum & mag hydroxide-simeth (MAALOX/MYLANTA) 200-200-20 MG/5ML suspension 30 mL  30 mL Oral Q4H PRN Revonda Humphrey, NP   30 mL at 02/04/22 2221   apixaban (ELIQUIS) tablet 5 mg  5 mg Oral BID Revonda Humphrey, NP   5 mg at 02/05/22 0910   ARIPiprazole (ABILIFY) tablet 10 mg  10 mg Oral Daily Revonda Humphrey, NP   10 mg at 02/05/22 0910   cloZAPine (CLOZARIL) tablet 50 mg  50 mg Oral Daily Revonda Humphrey, NP   50 mg at 02/05/22 0910   cloZAPine (CLOZARIL) tablet 75 mg  75 mg Oral QHS Revonda Humphrey, NP   75 mg at 02/04/22 2111   diclofenac Sodium (VOLTAREN) 1 % topical gel 2 g  2 g Topical BID PRN Rankin, Shuvon B, NP   2 g at 02/03/22 0920   diltiazem (CARDIZEM CD) 24 hr capsule 240 mg  240 mg Oral Daily Revonda Humphrey, NP   240 mg at 02/05/22 0910   divalproex (DEPAKOTE ER) 24 hr tablet 500 mg  500 mg Oral BID Revonda Humphrey, NP   500 mg at 02/05/22 0910   fluticasone furoate-vilanterol (BREO ELLIPTA) 200-25 MCG/ACT 1 puff  1 puff Inhalation Daily Revonda Humphrey, NP   1 puff at 02/04/22 0742   haloperidol (HALDOL) tablet 10 mg  10 mg Oral TID PRN Revonda Humphrey, NP       hydrOXYzine (ATARAX) tablet 25 mg  25 mg Oral TID PRN Revonda Humphrey, NP   25 mg at 02/01/22 2134   insulin aspart (novoLOG) injection 0-9 Units  0-9 Units Subcutaneous TID WC Revonda Humphrey, NP   2 Units at 02/05/22 0744   insulin glargine-yfgn (SEMGLEE) injection 12 Units  12 Units Subcutaneous Q1200 Revonda Humphrey, NP   12 Units at 02/04/22 1216   latanoprost (XALATAN) 0.005 % ophthalmic solution 1 drop  1 drop Both Eyes QHS Revonda Humphrey, NP   1 drop at 02/04/22 2115   loperamide (IMODIUM) capsule 2-4 mg  2-4 mg Oral PRN Rankin,  Shuvon B, NP   4 mg at 02/03/22 1411   magnesium hydroxide (MILK OF MAGNESIA) suspension 30 mL  30 mL Oral Daily PRN Revonda Humphrey, NP       menthol-cetylpyridinium (CEPACOL) lozenge 3 mg  1 lozenge Oral TID PRN Onuoha, Chinwendu V, NP   3 mg at 02/04/22 2111   metFORMIN (GLUCOPHAGE-XR) 24 hr tablet 1,000 mg  1,000 mg Oral BID WC Revonda Humphrey, NP  1,000 mg at 02/05/22 0745   nicotine (NICODERM CQ - dosed in mg/24 hr) patch 7 mg  7 mg Transdermal Daily Revonda Humphrey, NP   7 mg at 02/05/22 0913   traZODone (DESYREL) tablet 50 mg  50 mg Oral QHS PRN Revonda Humphrey, NP   50 mg at 02/01/22 2134   valbenazine (INGREZZA) capsule 40 mg  40 mg Oral q AM Revonda Humphrey, NP   40 mg at 02/05/22 9357   Vitamin D (Ergocalciferol) (DRISDOL) 1.25 MG (50000 UNIT) capsule 50,000 Units  50,000 Units Oral Q Sharren Bridge, NP   50,000 Units at 02/04/22 0919   Current Outpatient Medications  Medication Sig Dispense Refill   Accu-Chek Softclix Lancets lancets Use as directed up to four times daily 100 each 0   apixaban (ELIQUIS) 5 MG TABS tablet Take 1 tablet (5 mg total) by mouth 2 (two) times daily. 60 tablet 0   ARIPiprazole (ABILIFY) 10 MG tablet Take 1 tablet (10 mg total) by mouth daily. 30 tablet 0   Blood Glucose Monitoring Suppl (ACCU-CHEK GUIDE) w/Device KIT Use as directed up to four times daily 1 kit 0   budesonide-formoterol (SYMBICORT) 160-4.5 MCG/ACT inhaler Inhale 2 puffs into the lungs in the morning and at bedtime.     Cholecalciferol (VITAMIN D3) 1.25 MG (50000 UT) CAPS Take 50,000 Units by mouth every Thursday.     cloZAPine (CLOZARIL) 25 MG tablet Take 3 tablets (75 mg total) by mouth at bedtime. 90 tablet 0   clozapine (CLOZARIL) 50 MG tablet Take 1 tablet (50 mg total) by mouth daily. 30 tablet 0   diltiazem (CARDIZEM CD) 240 MG 24 hr capsule Take 1 capsule (240 mg total) by mouth daily. (Patient not taking: Reported on 11/24/2021) 30 capsule 0   divalproex  (DEPAKOTE ER) 500 MG 24 hr tablet Take 1 tablet (500 mg total) by mouth 2 (two) times daily. 60 tablet 0   glucose blood test strip Use as directed up to four times daily 50 each 0   haloperidol (HALDOL) 10 MG tablet Take 1 tablet (10 mg total) by mouth 3 (three) times daily as needed for agitation (and psychotic symptoms).     INGREZZA 40 MG capsule Take 1 capsule (40 mg total) by mouth in the morning. 30 capsule 0   insulin aspart (NOVOLOG) 100 UNIT/ML FlexPen Before each meal 3 times a day, 140-199 - 2 units, 200-250 - 4 units, 251-299 - 6 units,  300-349 - 8 units,  350 or above 10 units. Insulin PEN if approved, provide syringes and needles if needed.Please switch to any approved short acting Insulin if needed. 15 mL 0   insulin glargine (LANTUS) 100 UNIT/ML Solostar Pen Inject 12 Units into the skin daily. 15 mL 0   Insulin Pen Needle 32G X 4 MM MISC Use 4 times a day with insulin, 1 month supply. 100 each 0   latanoprost (XALATAN) 0.005 % ophthalmic solution Place 1 drop into both eyes at bedtime.     metFORMIN (GLUCOPHAGE) 500 MG tablet Take 500 mg by mouth 2 (two) times daily with a meal.     nicotine (NICODERM CQ - DOSED IN MG/24 HR) 7 mg/24hr patch Place 1 patch (7 mg total) onto the skin daily. 28 patch 0   PROAIR HFA 108 (90 Base) MCG/ACT inhaler Inhale 2 puffs into the lungs every 6 (six) hours as needed for wheezing or shortness of breath.      Labs  Lab Results:  Admission on 01/24/2022  Component Date Value Ref Range Status   Glucose-Capillary 01/24/2022 143 (H)  70 - 99 mg/dL Final   Glucose reference range applies only to samples taken after fasting for at least 8 hours.   Glucose-Capillary 01/24/2022 120 (H)  70 - 99 mg/dL Final   Glucose reference range applies only to samples taken after fasting for at least 8 hours.   Glucose-Capillary 01/25/2022 122 (H)  70 - 99 mg/dL Final   Glucose reference range applies only to samples taken after fasting for at least 8 hours.    Glucose-Capillary 01/25/2022 190 (H)  70 - 99 mg/dL Final   Glucose reference range applies only to samples taken after fasting for at least 8 hours.   Glucose-Capillary 01/25/2022 163 (H)  70 - 99 mg/dL Final   Glucose reference range applies only to samples taken after fasting for at least 8 hours.   WBC 01/26/2022 6.5  4.0 - 10.5 K/uL Final   RBC 01/26/2022 4.53  3.87 - 5.11 MIL/uL Final   Hemoglobin 01/26/2022 11.3 (L)  12.0 - 15.0 g/dL Final   HCT 01/26/2022 36.4  36.0 - 46.0 % Final   MCV 01/26/2022 80.4  80.0 - 100.0 fL Final   MCH 01/26/2022 24.9 (L)  26.0 - 34.0 pg Final   MCHC 01/26/2022 31.0  30.0 - 36.0 g/dL Final   RDW 01/26/2022 17.3 (H)  11.5 - 15.5 % Final   Platelets 01/26/2022 327  150 - 400 K/uL Final   nRBC 01/26/2022 0.0  0.0 - 0.2 % Final   Neutrophils Relative % 01/26/2022 60  % Final   Neutro Abs 01/26/2022 3.9  1.7 - 7.7 K/uL Final   Lymphocytes Relative 01/26/2022 31  % Final   Lymphs Abs 01/26/2022 2.0  0.7 - 4.0 K/uL Final   Monocytes Relative 01/26/2022 7  % Final   Monocytes Absolute 01/26/2022 0.5  0.1 - 1.0 K/uL Final   Eosinophils Relative 01/26/2022 1  % Final   Eosinophils Absolute 01/26/2022 0.1  0.0 - 0.5 K/uL Final   Basophils Relative 01/26/2022 1  % Final   Basophils Absolute 01/26/2022 0.1  0.0 - 0.1 K/uL Final   Immature Granulocytes 01/26/2022 0  % Final   Abs Immature Granulocytes 01/26/2022 0.02  0.00 - 0.07 K/uL Final   Performed at [facility], [place] [address], [place], [place] [address]   Glucose-Capillary 01/26/2022 149 (H)  70 - 99 mg/dL Final   Glucose reference range applies only to samples taken after fasting for at least 8 hours.   Glucose-Capillary 01/26/2022 130 (H)  70 - 99 mg/dL Final   Glucose reference range applies only to samples taken after fasting for at least 8 hours.   Glucose-Capillary 01/26/2022 151 (H)  70 - 99 mg/dL Final   Glucose reference range applies only to samples taken after fasting for at least 8  hours.   Glucose-Capillary 01/26/2022 170 (H)  70 - 99 mg/dL Final   Glucose reference range applies only to samples taken after fasting for at least 8 hours.   Glucose-Capillary 01/27/2022 129 (H)  70 - 99 mg/dL Final   Glucose reference range applies only to samples taken after fasting for at least 8 hours.   Glucose-Capillary 01/27/2022 101 (H)  70 - 99 mg/dL Final   Glucose reference range applies only to samples taken after fasting for at least 8 hours.   Glucose-Capillary 01/27/2022 220 (H)  70 - 99  mg/dL Final   Glucose reference range applies only to samples taken after fasting for at least 8 hours.   Glucose-Capillary 01/27/2022 154 (H)  70 - 99 mg/dL Final   Glucose reference range applies only to samples taken after fasting for at least 8 hours.   Glucose-Capillary 01/27/2022 163 (H)  70 - 99 mg/dL Final   Glucose reference range applies only to samples taken after fasting for at least 8 hours.   Glucose-Capillary 01/28/2022 127 (H)  70 - 99 mg/dL Final   Glucose reference range applies only to samples taken after fasting for at least 8 hours.   Glucose-Capillary 01/28/2022 110 (H)  70 - 99 mg/dL Final   Glucose reference range applies only to samples taken after fasting for at least 8 hours.   Glucose-Capillary 01/28/2022 167 (H)  70 - 99 mg/dL Final   Glucose reference range applies only to samples taken after fasting for at least 8 hours.   Glucose-Capillary 01/29/2022 145 (H)  70 - 99 mg/dL Final   Glucose reference range applies only to samples taken after fasting for at least 8 hours.   Glucose-Capillary 01/29/2022 203 (H)  70 - 99 mg/dL Final   Glucose reference range applies only to samples taken after fasting for at least 8 hours.   Glucose-Capillary 01/29/2022 153 (H)  70 - 99 mg/dL Final   Glucose reference range applies only to samples taken after fasting for at least 8 hours.   Glucose-Capillary 01/29/2022 166 (H)  70 - 99 mg/dL Final   Glucose reference range  applies only to samples taken after fasting for at least 8 hours.   Glucose-Capillary 01/30/2022 142 (H)  70 - 99 mg/dL Final   Glucose reference range applies only to samples taken after fasting for at least 8 hours.   Glucose-Capillary 01/30/2022 138 (H)  70 - 99 mg/dL Final   Glucose reference range applies only to samples taken after fasting for at least 8 hours.   Glucose-Capillary 01/30/2022 185 (H)  70 - 99 mg/dL Final   Glucose reference range applies only to samples taken after fasting for at least 8 hours.   Glucose-Capillary 01/30/2022 220 (H)  70 - 99 mg/dL Final   Glucose reference range applies only to samples taken after fasting for at least 8 hours.   Glucose-Capillary 01/31/2022 147 (H)  70 - 99 mg/dL Final   Glucose reference range applies only to samples taken after fasting for at least 8 hours.   Glucose-Capillary 01/31/2022 131 (H)  70 - 99 mg/dL Final   Glucose reference range applies only to samples taken after fasting for at least 8 hours.   Glucose-Capillary 01/31/2022 169 (H)  70 - 99 mg/dL Final   Glucose reference range applies only to samples taken after fasting for at least 8 hours.   Glucose-Capillary 01/31/2022 144 (H)  70 - 99 mg/dL Final   Glucose reference range applies only to samples taken after fasting for at least 8 hours.   Comment 1 01/31/2022 RN AWARE   Final   Glucose-Capillary 02/01/2022 117 (H)  70 - 99 mg/dL Final   Glucose reference range applies only to samples taken after fasting for at least 8 hours.   Glucose-Capillary 02/01/2022 180 (H)  70 - 99 mg/dL Final   Glucose reference range applies only to samples taken after fasting for at least 8 hours.   Glucose-Capillary 02/01/2022 209 (H)  70 - 99 mg/dL Final   Glucose reference range applies only  to samples taken after fasting for at least 8 hours.   Glucose-Capillary 02/02/2022 131 (H)  70 - 99 mg/dL Final   Glucose reference range applies only to samples taken after fasting for at least 8  hours.   WBC 02/02/2022 7.5  4.0 - 10.5 K/uL Final   RBC 02/02/2022 4.52  3.87 - 5.11 MIL/uL Final   Hemoglobin 02/02/2022 11.3 (L)  12.0 - 15.0 g/dL Final   HCT 02/02/2022 35.0 (L)  36.0 - 46.0 % Final   MCV 02/02/2022 77.4 (L)  80.0 - 100.0 fL Final   MCH 02/02/2022 25.0 (L)  26.0 - 34.0 pg Final   MCHC 02/02/2022 32.3  30.0 - 36.0 g/dL Final   RDW 02/02/2022 16.7 (H)  11.5 - 15.5 % Final   Platelets 02/02/2022 345  150 - 400 K/uL Final   nRBC 02/02/2022 0.0  0.0 - 0.2 % Final   Neutrophils Relative % 02/02/2022 63  % Final   Neutro Abs 02/02/2022 4.7  1.7 - 7.7 K/uL Final   Lymphocytes Relative 02/02/2022 28  % Final   Lymphs Abs 02/02/2022 2.1  0.7 - 4.0 K/uL Final   Monocytes Relative 02/02/2022 7  % Final   Monocytes Absolute 02/02/2022 0.5  0.1 - 1.0 K/uL Final   Eosinophils Relative 02/02/2022 1  % Final   Eosinophils Absolute 02/02/2022 0.1  0.0 - 0.5 K/uL Final   Basophils Relative 02/02/2022 1  % Final   Basophils Absolute 02/02/2022 0.1  0.0 - 0.1 K/uL Final   Immature Granulocytes 02/02/2022 0  % Final   Abs Immature Granulocytes 02/02/2022 0.03  0.00 - 0.07 K/uL Final   Performed at [facility], [place] [address], [place], [place] [address]   Glucose-Capillary 02/02/2022 95  70 - 99 mg/dL Final   Glucose reference range applies only to samples taken after fasting for at least 8 hours.   Glucose-Capillary 02/02/2022 120 (H)  70 - 99 mg/dL Final   Glucose reference range applies only to samples taken after fasting for at least 8 hours.   Glucose-Capillary 02/02/2022 191 (H)  70 - 99 mg/dL Final   Glucose reference range applies only to samples taken after fasting for at least 8 hours.   Glucose-Capillary 02/03/2022 148 (H)  70 - 99 mg/dL Final   Glucose reference range applies only to samples taken after fasting for at least 8 hours.   Glucose-Capillary 02/03/2022 122 (H)  70 - 99 mg/dL Final   Glucose reference range applies only to samples taken after fasting for at  least 8 hours.   Glucose-Capillary 02/03/2022 233 (H)  70 - 99 mg/dL Final   Glucose reference range applies only to samples taken after fasting for at least 8 hours.   Glucose-Capillary 02/04/2022 126 (H)  70 - 99 mg/dL Final   Glucose reference range applies only to samples taken after fasting for at least 8 hours.   Glucose-Capillary 02/04/2022 117 (H)  70 - 99 mg/dL Final   Glucose reference range applies only to samples taken after fasting for at least 8 hours.   Glucose-Capillary 02/04/2022 135 (H)  70 - 99 mg/dL Final   Glucose reference range applies only to samples taken after fasting for at least 8 hours.   Glucose-Capillary 02/04/2022 197 (H)  70 - 99 mg/dL Final   Glucose reference range applies only to samples taken after fasting for at least 8 hours.   Glucose-Capillary 02/05/2022 170 (H)  70 - 99 mg/dL Final  Glucose reference range applies only to samples taken after fasting for at least 8 hours.  Admission on 01/23/2022, Discharged on 01/24/2022  Component Date Value Ref Range Status   Sodium 01/23/2022 140  135 - 145 mmol/L Final   Potassium 01/23/2022 4.4  3.5 - 5.1 mmol/L Final   Chloride 01/23/2022 101  98 - 111 mmol/L Final   CO2 01/23/2022 25  22 - 32 mmol/L Final   Glucose, Bld 01/23/2022 198 (H)  70 - 99 mg/dL Final   Glucose reference range applies only to samples taken after fasting for at least 8 hours.   BUN 01/23/2022 13  6 - 20 mg/dL Final   Creatinine, Ser 01/23/2022 0.62  0.44 - 1.00 mg/dL Final   Calcium 01/23/2022 9.3  8.9 - 10.3 mg/dL Final   GFR, Estimated 01/23/2022 >60  >60 mL/min Final   Comment: (NOTE) Calculated using the CKD-EPI Creatinine Equation (2021)    Anion gap 01/23/2022 14  5 - 15 Final   Performed at [facility], [place] [address], [place], [place] [address]   WBC 01/23/2022 5.8  4.0 - 10.5 K/uL Final   RBC 01/23/2022 4.63  3.87 - 5.11 MIL/uL Final   Hemoglobin 01/23/2022 11.7 (L)  12.0 - 15.0 g/dL Final   HCT 01/23/2022 37.4   36.0 - 46.0 % Final   MCV 01/23/2022 80.8  80.0 - 100.0 fL Final   MCH 01/23/2022 25.3 (L)  26.0 - 34.0 pg Final   MCHC 01/23/2022 31.3  30.0 - 36.0 g/dL Final   RDW 01/23/2022 17.9 (H)  11.5 - 15.5 % Final   Platelets 01/23/2022 333  150 - 400 K/uL Final   nRBC 01/23/2022 0.0  0.0 - 0.2 % Final   Performed at [facility] [address] [place], [place] [address]   Troponin I (High Sensitivity) 01/23/2022 6  <18 ng/L Final   Comment: (NOTE) Elevated high sensitivity troponin I (hsTnI) values and significant  changes across serial measurements may suggest ACS but many other  chronic and acute conditions are known to elevate hsTnI results.  Refer to the "Links" section for chest pain algorithms and additional  guidance. Performed at [facility], [place] [address], [place], [place] [address]    Troponin I (High Sensitivity) 01/23/2022 5  <18 ng/L Final   Comment: (NOTE) Elevated high sensitivity troponin I (hsTnI) values and significant  changes across serial measurements may suggest ACS but many other  chronic and acute conditions are known to elevate hsTnI results.  Refer to the "Links" section for chest pain algorithms and additional  guidance. Performed at [facility], [place] [address], [place], [place] [address]    SARS Coronavirus 2 by RT PCR 01/23/2022 NEGATIVE  NEGATIVE Final   Comment: (NOTE) SARS-CoV-2 target nucleic acids are NOT DETECTED.  The SARS-CoV-2 RNA is generally detectable in upper and lower respiratory specimens during the acute phase of infection. The lowest concentration of SARS-CoV-2 viral copies this assay can detect is 250 copies / mL. A negative result does not preclude SARS-CoV-2 infection and should not be used as the sole basis for treatment or other patient management decisions.  A negative result may occur with improper specimen collection / handling, submission of specimen other than nasopharyngeal swab, presence of viral  mutation(s) within the areas targeted by this assay, and inadequate number of viral copies (<250 copies / mL). A negative result must be combined with clinical observations, patient history, and  epidemiological information.  Fact Sheet for Patients:   https://www.patel.info/  Fact Sheet for Healthcare Providers: https://hall.com/  This test is not yet approved or                           cleared by the [place] FDA and has been authorized for detection and/or diagnosis of SARS-CoV-2 by FDA under an Emergency Use Authorization (EUA).  This EUA will remain in effect (meaning this test can be used) for the duration of the COVID-19 declaration under Section 564(b)(1) of the Act, 21 U.S.C. section 360bbb-3(b)(1), unless the authorization is terminated or revoked sooner.  Performed at [facility], [place] [address] [place], [place] [address]    B Natriuretic Peptide 01/23/2022 41.3  0.0 - 100.0 pg/mL Final   Performed at [facility] [address] [place], [place] [address]   Group A Strep by PCR 01/23/2022 NOT DETECTED  NOT DETECTED Final   Performed at [facility], [place] [address], [place], [place] [address]   Color, Urine 01/23/2022 YELLOW  YELLOW Final   APPearance 01/23/2022 CLEAR  CLEAR Final   Specific Gravity, Urine 01/23/2022 1.018  1.005 - 1.030 Final   pH 01/23/2022 7.0  5.0 - 8.0 Final   Glucose, UA 01/23/2022 NEGATIVE  NEGATIVE mg/dL Final   Hgb urine dipstick 01/23/2022 NEGATIVE  NEGATIVE Final   Bilirubin Urine 01/23/2022 NEGATIVE  NEGATIVE Final   Ketones, ur 01/23/2022 NEGATIVE  NEGATIVE mg/dL Final   Protein, ur 01/23/2022 NEGATIVE  NEGATIVE mg/dL Final   Nitrite 01/23/2022 NEGATIVE  NEGATIVE Final   Leukocytes,Ua 01/23/2022 TRACE (A)  NEGATIVE Final   RBC / HPF 01/23/2022 0-5  0 - 5 RBC/hpf Final   WBC, UA 01/23/2022 0-5  0 - 5 WBC/hpf Final   Bacteria, UA 01/23/2022 NONE SEEN  NONE SEEN Final    Squamous Epithelial / LPF 01/23/2022 0-5  0 - 5 Final   Mucus 01/23/2022 PRESENT   Final   Performed at [facility], [place] [address], [place], [place] [address]   SARS Coronavirus 2 by RT PCR 01/23/2022 NEGATIVE  NEGATIVE Final   Comment: (NOTE) SARS-CoV-2 target nucleic acids are NOT DETECTED.  The SARS-CoV-2 RNA is generally detectable in upper respiratory specimens during the acute phase of infection. The lowest concentration of SARS-CoV-2 viral copies this assay can detect is 138 copies/mL. A negative result does not preclude SARS-Cov-2 infection and should not be used as the sole basis for treatment or other patient management decisions. A negative result may occur with  improper specimen collection/handling, submission of specimen other than nasopharyngeal swab, presence of viral mutation(s) within the areas targeted by this assay, and inadequate number of viral copies(<138 copies/mL). A negative result must be combined with clinical observations, patient history, and epidemiological information. The expected result is Negative.  Fact Sheet for Patients:  EntrepreneurPulse.com.au  Fact Sheet for Healthcare Providers:  IncredibleEmployment.be  This test is no                          t yet approved or cleared by the [place] FDA and  has been authorized for detection and/or diagnosis of SARS-CoV-2 by FDA under an Emergency Use Authorization (EUA). This EUA will remain  in effect (meaning this test can be used) for the duration of the COVID-19 declaration under Section 564(b)(1) of the Act, 21 U.S.C.section 360bbb-3(b)(1), unless  the authorization is terminated  or revoked sooner.       Influenza A by PCR 01/23/2022 NEGATIVE  NEGATIVE Final   Influenza B by PCR 01/23/2022 NEGATIVE  NEGATIVE Final   Comment: (NOTE) The Xpert Xpress SARS-CoV-2/FLU/RSV plus assay is intended as an aid in the diagnosis of influenza from  Nasopharyngeal swab specimens and should not be used as a sole basis for treatment. Nasal washings and aspirates are unacceptable for Xpert Xpress SARS-CoV-2/FLU/RSV testing.  Fact Sheet for Patients: EntrepreneurPulse.com.au  Fact Sheet for Healthcare Providers: IncredibleEmployment.be  This test is not yet approved or cleared by the [place] FDA and has been authorized for detection and/or diagnosis of SARS-CoV-2 by FDA under an Emergency Use Authorization (EUA). This EUA will remain in effect (meaning this test can be used) for the duration of the COVID-19 declaration under Section 564(b)(1) of the Act, 21 U.S.C. section 360bbb-3(b)(1), unless the authorization is terminated or revoked.  Performed at [facility], [place] [address] [place], [place] [address]    WBC 01/24/2022 6.1  4.0 - 10.5 K/uL Final   RBC 01/24/2022 4.60  3.87 - 5.11 MIL/uL Final   Hemoglobin 01/24/2022 11.7 (L)  12.0 - 15.0 g/dL Final   HCT 01/24/2022 37.0  36.0 - 46.0 % Final   MCV 01/24/2022 80.4  80.0 - 100.0 fL Final   MCH 01/24/2022 25.4 (L)  26.0 - 34.0 pg Final   MCHC 01/24/2022 31.6  30.0 - 36.0 g/dL Final   RDW 01/24/2022 17.6 (H)  11.5 - 15.5 % Final   Platelets 01/24/2022 334  150 - 400 K/uL Final   nRBC 01/24/2022 0.0  0.0 - 0.2 % Final   Neutrophils Relative % 01/24/2022 53  % Final   Neutro Abs 01/24/2022 3.3  1.7 - 7.7 K/uL Final   Lymphocytes Relative 01/24/2022 33  % Final   Lymphs Abs 01/24/2022 2.0  0.7 - 4.0 K/uL Final   Monocytes Relative 01/24/2022 11  % Final   Monocytes Absolute 01/24/2022 0.7  0.1 - 1.0 K/uL Final   Eosinophils Relative 01/24/2022 2  % Final   Eosinophils Absolute 01/24/2022 0.1  0.0 - 0.5 K/uL Final   Basophils Relative 01/24/2022 1  % Final   Basophils Absolute 01/24/2022 0.1  0.0 - 0.1 K/uL Final   Immature Granulocytes 01/24/2022 0  % Final   Abs Immature Granulocytes 01/24/2022 0.02  0.00 - 0.07 K/uL Final    Performed at [facility], [place] [address] [place], [place] [address]   Glucose-Capillary 01/24/2022 110 (H)  70 - 99 mg/dL Final   Glucose reference range applies only to samples taken after fasting for at least 8 hours.  No results displayed because visit has over 200 results.    Admission on 11/22/2021, Discharged on 11/22/2021  Component Date Value Ref Range Status   Glucose-Capillary 11/22/2021 169 (H)  70 - 99 mg/dL Final   Glucose reference range applies only to samples taken after fasting for at least 8 hours.  No results displayed because visit has over 200 results.    Admission on 10/04/2021, Discharged on 10/22/2021  Component Date Value Ref Range Status   SARS Coronavirus 2 by RT PCR 10/04/2021 NEGATIVE  NEGATIVE Final   Comment: (NOTE) SARS-CoV-2 target nucleic acids are NOT DETECTED.  The SARS-CoV-2 RNA is generally detectable in upper respiratory specimens during the acute phase of infection. The lowest concentration of SARS-CoV-2 viral copies this assay can detect is 138 copies/mL. A  negative result does not preclude SARS-Cov-2 infection and should not be used as the sole basis for treatment or other patient management decisions. A negative result may occur with  improper specimen collection/handling, submission of specimen other than nasopharyngeal swab, presence of viral mutation(s) within the areas targeted by this assay, and inadequate number of viral copies(<138 copies/mL). A negative result must be combined with clinical observations, patient history, and epidemiological information. The expected result is Negative.  Fact Sheet for Patients:  EntrepreneurPulse.com.au  Fact Sheet for Healthcare Providers:  IncredibleEmployment.be  This test is no                          t yet approved or cleared by the [place] FDA and  has been authorized for detection and/or diagnosis of SARS-CoV-2 by FDA under an Emergency  Use Authorization (EUA). This EUA will remain  in effect (meaning this test can be used) for the duration of the COVID-19 declaration under Section 564(b)(1) of the Act, 21 U.S.C.section 360bbb-3(b)(1), unless the authorization is terminated  or revoked sooner.       Influenza A by PCR 10/04/2021 NEGATIVE  NEGATIVE Final   Influenza B by PCR 10/04/2021 NEGATIVE  NEGATIVE Final   Comment: (NOTE) The Xpert Xpress SARS-CoV-2/FLU/RSV plus assay is intended as an aid in the diagnosis of influenza from Nasopharyngeal swab specimens and should not be used as a sole basis for treatment. Nasal washings and aspirates are unacceptable for Xpert Xpress SARS-CoV-2/FLU/RSV testing.  Fact Sheet for Patients: EntrepreneurPulse.com.au  Fact Sheet for Healthcare Providers: IncredibleEmployment.be  This test is not yet approved or cleared by the [place] FDA and has been authorized for detection and/or diagnosis of SARS-CoV-2 by FDA under an Emergency Use Authorization (EUA). This EUA will remain in effect (meaning this test can be used) for the duration of the COVID-19 declaration under Section 564(b)(1) of the Act, 21 U.S.C. section 360bbb-3(b)(1), unless the authorization is terminated or revoked.  Performed at [facility], [place] [address], [place], [place] [address]    WBC 10/04/2021 8.4  4.0 - 10.5 K/uL Final   RBC 10/04/2021 4.42  3.87 - 5.11 MIL/uL Final   Hemoglobin 10/04/2021 11.6 (L)  12.0 - 15.0 g/dL Final   HCT 10/04/2021 35.7 (L)  36.0 - 46.0 % Final   MCV 10/04/2021 80.8  80.0 - 100.0 fL Final   MCH 10/04/2021 26.2  26.0 - 34.0 pg Final   MCHC 10/04/2021 32.5  30.0 - 36.0 g/dL Final   RDW 10/04/2021 16.0 (H)  11.5 - 15.5 % Final   Platelets 10/04/2021 372  150 - 400 K/uL Final   nRBC 10/04/2021 0.0  0.0 - 0.2 % Final   Neutrophils Relative % 10/04/2021 68  % Final   Neutro Abs 10/04/2021 5.7  1.7 - 7.7 K/uL Final   Lymphocytes  Relative 10/04/2021 22  % Final   Lymphs Abs 10/04/2021 1.8  0.7 - 4.0 K/uL Final   Monocytes Relative 10/04/2021 8  % Final   Monocytes Absolute 10/04/2021 0.7  0.1 - 1.0 K/uL Final   Eosinophils Relative 10/04/2021 1  % Final   Eosinophils Absolute 10/04/2021 0.1  0.0 - 0.5 K/uL Final   Basophils Relative 10/04/2021 1  % Final   Basophils Absolute 10/04/2021 0.1  0.0 - 0.1 K/uL Final   Immature Granulocytes 10/04/2021 0  % Final   Abs Immature Granulocytes 10/04/2021 0.03  0.00 - 0.07 K/uL Final  Performed at [facility], [place] [address], [place], [place] [address]   Sodium 10/04/2021 136  135 - 145 mmol/L Final   Potassium 10/04/2021 4.2  3.5 - 5.1 mmol/L Final   Chloride 10/04/2021 104  98 - 111 mmol/L Final   CO2 10/04/2021 25  22 - 32 mmol/L Final   Glucose, Bld 10/04/2021 100 (H)  70 - 99 mg/dL Final   Glucose reference range applies only to samples taken after fasting for at least 8 hours.   BUN 10/04/2021 9  6 - 20 mg/dL Final   Creatinine, Ser 10/04/2021 0.52  0.44 - 1.00 mg/dL Final   Calcium 10/04/2021 9.0  8.9 - 10.3 mg/dL Final   Total Protein 10/04/2021 7.0  6.5 - 8.1 g/dL Final   Albumin 10/04/2021 3.2 (L)  3.5 - 5.0 g/dL Final   AST 10/04/2021 13 (L)  15 - 41 U/L Final   ALT 10/04/2021 10  0 - 44 U/L Final   Alkaline Phosphatase 10/04/2021 61  38 - 126 U/L Final   Total Bilirubin 10/04/2021 0.3  0.3 - 1.2 mg/dL Final   GFR, Estimated 10/04/2021 >60  >60 mL/min Final   Comment: (NOTE) Calculated using the CKD-EPI Creatinine Equation (2021)    Anion gap 10/04/2021 7  5 - 15 Final   Performed at [place] [address], [place], [place] [address]   Hgb A1c MFr Bld 10/04/2021 7.0 (H)  4.8 - 5.6 % Final   Comment: (NOTE) Pre diabetes:          5.7%-6.4%  Diabetes:              >6.4%  Glycemic control for   <7.0% adults with diabetes    Mean Plasma Glucose 10/04/2021 154.2  mg/dL Final   Performed at [facility], [place] [address],  [place], [place] [address]   Cholesterol 10/04/2021 178  0 - 200 mg/dL Final   Triglycerides 10/04/2021 155 (H)  <150 mg/dL Final   HDL 10/04/2021 52  >40 mg/dL Final   Total CHOL/HDL Ratio 10/04/2021 3.4  RATIO Final   VLDL 10/04/2021 31  0 - 40 mg/dL Final   LDL Cholesterol 10/04/2021 95  0 - 99 mg/dL Final   Comment:        Total Cholesterol/HDL:CHD Risk Coronary Heart Disease Risk Table                     Men   Women  1/2 Average Risk   3.4   3.3  Average Risk       5.0   4.4  2 X Average Risk   9.6   7.1  3 X Average Risk  23.4   11.0        Use the calculated Patient Ratio above and the CHD Risk Table to determine the patient's CHD Risk.        ATP III CLASSIFICATION (LDL):  <100     mg/dL   Optimal  100-129  mg/dL   Near or Above                    Optimal  130-159  mg/dL   Borderline  160-189  mg/dL   High  >190     mg/dL   Very High Performed at [place] [address] [place], [place] [address]    POC Amphetamine UR 10/04/2021 None Detected  NONE DETECTED (Cut Off Level  1000 ng/mL) Final   POC Secobarbital (BAR) 10/04/2021 None Detected  NONE DETECTED (Cut Off Level 300 ng/mL) Final   POC Buprenorphine (BUP) 10/04/2021 None Detected  NONE DETECTED (Cut Off Level 10 ng/mL) Final   POC Oxazepam (BZO) 10/04/2021 None Detected  NONE DETECTED (Cut Off Level 300 ng/mL) Final   POC Cocaine UR 10/04/2021 None Detected  NONE DETECTED (Cut Off Level 300 ng/mL) Final   POC Methamphetamine UR 10/04/2021 None Detected  NONE DETECTED (Cut Off Level 1000 ng/mL) Final   POC Morphine 10/04/2021 None Detected  NONE DETECTED (Cut Off Level 300 ng/mL) Final   POC Methadone UR 10/04/2021 None Detected  NONE DETECTED (Cut Off Level 300 ng/mL) Final   POC Oxycodone UR 10/04/2021 Positive (A)  NONE DETECTED (Cut Off Level 100 ng/mL) Final   POC Marijuana UR 10/04/2021 None Detected  NONE DETECTED (Cut Off Level 50 ng/mL) Final   SARSCOV2ONAVIRUS 2 AG 10/04/2021 NEGATIVE  NEGATIVE  Final   Comment: (NOTE) SARS-CoV-2 antigen NOT DETECTED.   Negative results are presumptive.  Negative results do not preclude SARS-CoV-2 infection and should not be used as the sole basis for treatment or other patient management decisions, including infection  control decisions, particularly in the presence of clinical signs and  symptoms consistent with COVID-19, or in those who have been in contact with the virus.  Negative results must be combined with clinical observations, patient history, and epidemiological information. The expected result is Negative.  Fact Sheet for Patients: HandmadeRecipes.com.cy  Fact Sheet for Healthcare Providers: FuneralLife.at  This test is not yet approved or cleared by the [place] FDA and  has been authorized for detection and/or diagnosis of SARS-CoV-2 by FDA under an Emergency Use Authorization (EUA).  This EUA will remain in effect (meaning this test can be used) for the duration of  the COV                          ID-19 declaration under Section 564(b)(1) of the Act, 21 U.S.C. section 360bbb-3(b)(1), unless the authorization is terminated or revoked sooner.     Valproic Acid Lvl 10/04/2021 51  50.0 - 100.0 ug/mL Final   Performed at [place] [facility] [address] [place], [place] [address]   Valproic Acid Lvl 10/08/2021 57  50.0 - 100.0 ug/mL Final   Performed at [place] [address] [place], [place] [address]   Glucose-Capillary 10/09/2021 123 (H)  70 - 99 mg/dL Final   Glucose reference range applies only to samples taken after fasting for at least 8 hours.  Admission on 09/09/2021, Discharged on 09/10/2021  Component Date Value Ref Range Status   Sodium 09/09/2021 134 (L)  135 - 145 mmol/L Final   Potassium 09/09/2021 4.3  3.5 - 5.1 mmol/L Final   Chloride 09/09/2021 99  98 - 111 mmol/L Final   CO2 09/09/2021 25  22 - 32 mmol/L Final   Glucose, Bld 09/09/2021  107 (H)  70 - 99 mg/dL Final   Glucose reference range applies only to samples taken after fasting for at least 8 hours.   BUN 09/09/2021 12  6 - 20 mg/dL Final   Creatinine, Ser 09/09/2021 0.74  0.44 - 1.00 mg/dL Final   Calcium 09/09/2021 9.0  8.9 - 10.3 mg/dL Final   Total Protein 09/09/2021 7.4  6.5 - 8.1 g/dL Final   Albumin 09/09/2021 3.1 (L)  3.5 - 5.0 g/dL Final  AST 09/09/2021 13 (L)  15 - 41 U/L Final   ALT 09/09/2021 13  0 - 44 U/L Final   Alkaline Phosphatase 09/09/2021 66  38 - 126 U/L Final   Total Bilirubin 09/09/2021 0.2 (L)  0.3 - 1.2 mg/dL Final   GFR, Estimated 09/09/2021 >60  >60 mL/min Final   Comment: (NOTE) Calculated using the CKD-EPI Creatinine Equation (2021)    Anion gap 09/09/2021 10  5 - 15 Final   Performed at [facility], [place] [address], [place], [place] [address]   WBC 09/09/2021 8.5  4.0 - 10.5 K/uL Final   RBC 09/09/2021 4.53  3.87 - 5.11 MIL/uL Final   Hemoglobin 09/09/2021 11.8 (L)  12.0 - 15.0 g/dL Final   HCT 09/09/2021 38.0  36.0 - 46.0 % Final   MCV 09/09/2021 83.9  80.0 - 100.0 fL Final   MCH 09/09/2021 26.0  26.0 - 34.0 pg Final   MCHC 09/09/2021 31.1  30.0 - 36.0 g/dL Final   RDW 09/09/2021 17.8 (H)  11.5 - 15.5 % Final   Platelets 09/09/2021 442 (H)  150 - 400 K/uL Final   nRBC 09/09/2021 0.0  0.0 - 0.2 % Final   Neutrophils Relative % 09/09/2021 70  % Final   Neutro Abs 09/09/2021 5.9  1.7 - 7.7 K/uL Final   Lymphocytes Relative 09/09/2021 20  % Final   Lymphs Abs 09/09/2021 1.7  0.7 - 4.0 K/uL Final   Monocytes Relative 09/09/2021 8  % Final   Monocytes Absolute 09/09/2021 0.7  0.1 - 1.0 K/uL Final   Eosinophils Relative 09/09/2021 1  % Final   Eosinophils Absolute 09/09/2021 0.1  0.0 - 0.5 K/uL Final   Basophils Relative 09/09/2021 1  % Final   Basophils Absolute 09/09/2021 0.1  0.0 - 0.1 K/uL Final   Immature Granulocytes 09/09/2021 0  % Final   Abs Immature Granulocytes 09/09/2021 0.03  0.00 - 0.07 K/uL Final   Performed  at [facility], [place] [address], [place], [place] [address]   Ammonia 09/09/2021 37 (H)  9 - 35 umol/L Final   Comment: HEMOLYSIS AT THIS LEVEL MAY AFFECT RESULT Performed at [facility], [place] [address], [place], [place] [address]    Opiates 09/09/2021 NONE DETECTED  NONE DETECTED Final   Cocaine 09/09/2021 NONE DETECTED  NONE DETECTED Final   Benzodiazepines 09/09/2021 NONE DETECTED  NONE DETECTED Final   Amphetamines 09/09/2021 NONE DETECTED  NONE DETECTED Final   Tetrahydrocannabinol 09/09/2021 NONE DETECTED  NONE DETECTED Final   Barbiturates 09/09/2021 NONE DETECTED  NONE DETECTED Final   Comment: (NOTE) DRUG SCREEN FOR MEDICAL PURPOSES ONLY.  IF CONFIRMATION IS NEEDED FOR ANY PURPOSE, NOTIFY LAB WITHIN 5 DAYS.  LOWEST DETECTABLE LIMITS FOR URINE DRUG SCREEN Drug Class                     Cutoff (ng/mL) Amphetamine and metabolites    1000 Barbiturate and metabolites    200 Benzodiazepine                 277 Tricyclics and metabolites     300 Opiates and metabolites        300 Cocaine and metabolites        300 THC                            50 Performed at [facility], [address] [place], [place] [address]    Alcohol, Ethyl (B) 09/09/2021 <10  <10 mg/dL Final   Comment: (NOTE) Lowest detectable limit for serum alcohol is 10 mg/dL.  For medical purposes only. Performed at [facility], [place] [address] [place], [place] [address]    Lipase 09/09/2021 33  11 - 51 U/L Final   Performed at [place] [place] [address] [place], [place] [address]   Color, Urine 09/09/2021 COLORLESS (A)  YELLOW Final   APPearance 09/09/2021 CLEAR  CLEAR Final   Specific Gravity, Urine 09/09/2021 1.002 (L)  1.005 - 1.030 Final   pH 09/09/2021 6.0  5.0 - 8.0 Final   Glucose, UA 09/09/2021 NEGATIVE  NEGATIVE mg/dL Final   Hgb urine dipstick 09/09/2021 NEGATIVE  NEGATIVE Final   Bilirubin Urine 09/09/2021 NEGATIVE  NEGATIVE Final   Ketones, ur 09/09/2021  NEGATIVE  NEGATIVE mg/dL Final   Protein, ur 09/09/2021 NEGATIVE  NEGATIVE mg/dL Final   Nitrite 09/09/2021 NEGATIVE  NEGATIVE Final   Leukocytes,Ua 09/09/2021 NEGATIVE  NEGATIVE Final   Performed at [facility] [address], [place], [place] [address]   Specimen Description 09/09/2021 URINE, CLEAN CATCH   Final   Special Requests 09/09/2021 NONE   Final   Culture 09/09/2021  (A)   Final                   Value:<10,000 COLONIES/mL INSIGNIFICANT GROWTH Performed at [facility], [place] [address], [place], [place] [address]    Report Status 09/09/2021 09/10/2021 FINAL   Final   D-Dimer, Quant 09/09/2021 0.92 (H)  0.00 - 0.50 ug/mL-FEU Final   Comment: (NOTE) At the manufacturer cut-off value of 0.5 g/mL FEU, this assay has a negative predictive value of 95-100%.This assay is intended for use in conjunction with a clinical pretest probability (PTP) assessment model to exclude pulmonary embolism (PE) and deep venous thrombosis (DVT) in outpatients suspected of PE or DVT. Results should be correlated with clinical presentation. Performed at [facility], [place] [address] [place], [place] [address]    Troponin I (High Sensitivity) 09/09/2021 5  <18 ng/L Final   Comment: (NOTE) Elevated high sensitivity troponin I (hsTnI) values and significant  changes across serial measurements may suggest ACS but many other  chronic and acute conditions are known to elevate hsTnI results.  Refer to the "Links" section for chest pain algorithms and additional  guidance. Performed at [facility], [place] [address] [place], [place] [address]    BP 09/10/2021 135/83  mmHg Final   S' Lateral 09/10/2021 2.30  cm Final   Area-P 1/2 09/10/2021 5.58  cm2 Final    Blood Alcohol level:  Lab Results  Component Value Date   ETH <10 11/23/2021   ETH <10 07/62/2633    Metabolic Disorder Labs: Lab Results  Component Value Date   HGBA1C 6.7 (H) 12/28/2021   MPG 145.59 12/28/2021    MPG 154.2 10/04/2021   Lab Results  Component Value Date   PROLACTIN 3.8 (L) 11/23/2021   Lab Results  Component Value Date   CHOL 171 11/23/2021   TRIG 125 11/23/2021   HDL 65 11/23/2021   CHOLHDL 2.6 11/23/2021   VLDL 25 11/23/2021   LDLCALC 81 11/23/2021   LDLCALC 95 10/04/2021    Therapeutic Lab Levels: No results found for: "LITHIUM" Lab Results  Component Value Date   VALPROATE 33 (L) 01/18/2022   VALPROATE 33 (L) 12/28/2021  No results found for: "CBMZ"  Physical Findings   PHQ2-9    [facility] from 11/23/2021 in [facility]  PHQ-2 Total Score 2  PHQ-9 Total Score 3      Flowsheet Row ED from 01/23/2022 in [facility] from 11/23/2021 in [facility] from 11/22/2021 in [facility] DEPT  C-SSRS RISK CATEGORY No Risk No Risk No Risk        Musculoskeletal  Strength & Muscle Tone: within normal limits Gait & Station: normal Patient leans: N/A  Psychiatric Specialty Exam  Presentation  General Appearance:  Appropriate for Environment; Casual  Eye Contact: Fair  Speech: Clear and Coherent  Speech Volume: Normal  Handedness: Right   Mood and Affect  Mood: Anxious  Affect: Congruent   Thought Process  Thought Processes: Coherent  Descriptions of Associations:Intact  Orientation:Full (Time, Place and Person)  Thought Content:Tangential  Diagnosis of Schizophrenia or Schizoaffective disorder in past: Yes  Duration of Psychotic Symptoms: No data recorded  Hallucinations:Hallucinations: None  Ideas of Reference:None  Suicidal Thoughts:Suicidal Thoughts: No  Homicidal Thoughts:Homicidal Thoughts: No   Sensorium  Memory: Immediate Fair  Judgment: Intact  Insight: Present   Executive Functions  Concentration: Fair  Attention Span: Fair  Recall: Good  Fund of  Knowledge: Good  Language: Good   Psychomotor Activity  Psychomotor Activity: Psychomotor Activity: Normal   Assets  Assets: Communication Skills; Desire for Improvement   Sleep  Sleep: Sleep: Poor   No data recorded  Physical Exam  Physical Exam Vitals and nursing note reviewed.  Constitutional:      Appearance: Normal appearance. She is well-developed.  HENT:     Head: Normocephalic and atraumatic.     Nose: Nose normal.  Cardiovascular:     Rate and Rhythm: Normal rate.  Pulmonary:     Effort: Pulmonary effort is normal.  Musculoskeletal:        General: Normal range of motion.     Cervical back: Normal range of motion.  Skin:    General: Skin is warm and dry.  Neurological:     Mental Status: She is alert and oriented to person, place, and time.  Psychiatric:        Attention and Perception: Attention and perception normal.        Mood and Affect: Mood is anxious.        Speech: Speech is tangential.        Behavior: Behavior normal. Behavior is cooperative.        Thought Content: Thought content normal.    Review of Systems  Constitutional: Negative.   HENT: Negative.    Eyes: Negative.   Respiratory: Negative.    Cardiovascular: Negative.   Gastrointestinal: Negative.   Genitourinary: Negative.   Musculoskeletal: Negative.   Skin: Negative.   Neurological: Negative.   Psychiatric/Behavioral:  The patient is nervous/anxious.    Blood pressure (!) 144/88, pulse (!) 114, temperature 98.1 F (36.7 C), temperature source Oral, resp. rate 18, SpO2 95 %. There is no height or weight on file to calculate BMI.  Treatment Plan Summary: Patient reviewed with Dr. Hampton Abbot.  She remains cleared by psychiatry. Daily contact with patient to assess and evaluate symptoms and progress in treatment  Lucky Rathke, FNP 02/05/2022 9:32 AM

## 2022-02-05 NOTE — ED Notes (Signed)
Patient AOx4, resting well on the bed. Not in any acute distress. Will continue to monitor,

## 2022-02-06 DIAGNOSIS — F209 Schizophrenia, unspecified: Secondary | ICD-10-CM | POA: Diagnosis not present

## 2022-02-06 LAB — GLUCOSE, CAPILLARY
Glucose-Capillary: 144 mg/dL — ABNORMAL HIGH (ref 70–99)
Glucose-Capillary: 190 mg/dL — ABNORMAL HIGH (ref 70–99)
Glucose-Capillary: 92 mg/dL (ref 70–99)
Glucose-Capillary: 197 mg/dL — ABNORMAL HIGH (ref 70–99)

## 2022-02-06 MED ORDER — METRONIDAZOLE 0.75 % VA GEL
1.0000 | Freq: Every day | VAGINAL | Status: DC
  Administered 2022-02-06 – 2022-02-07 (×2): 1 via VAGINAL
  Filled 2022-02-06 (×2): qty 70

## 2022-02-06 NOTE — ED Notes (Signed)
Patient was given tylenol due to having back pain

## 2022-02-06 NOTE — ED Notes (Signed)
Patient sitting on bed watching TV.  Respirations equal and unlabored, skin warm and dry, NAD. Routine safety checks conducted according to facility protocol. Will continue to monitor for safety.

## 2022-02-06 NOTE — ED Notes (Signed)
Rn called dash @ 1755 to take urine sample to [facility] [place] lab.

## 2022-02-06 NOTE — ED Notes (Signed)
Patient A&Ox4. Denies intent to harm self/others when asked. Denies A/VH. Patient denies any physical complaints when asked. No acute distress noted. Support and encouragement provided. Routine safety checks conducted according to facility protocol. Encouraged patient to notify staff if thoughts of harm toward self or others arise. Patient verbalize understanding and agreement. Will continue to monitor for safety.    

## 2022-02-06 NOTE — ED Provider Notes (Signed)
Teresa Murillo reported pimple to left breast. Visualized mildly reddened abrasion (appears to be healed abrasion)to underside of left breast. No pain, no swelling, no warmth, no drainage.  Patient reports "my vagina does not smell fresh, I have used cream for this in the past." Denies urinary symptoms, no painful urination, no discharge, no frequency, no urgency. She denies flank/abdominal pain/tenderness.   Ordered clean catch urine cervicovaginal, rule out bacterial vaginitis and trichomonas.   Medication: -metronidazole metrogel 0.75% vaginal gel- I applicatorful daily at bedtime x 5 days

## 2022-02-06 NOTE — ED Notes (Signed)
Patient observed/assessed in bed/chair resting quietly appearing in no distress and verbalizing no complaints at this time. Will continue to monitor.  

## 2022-02-06 NOTE — ED Notes (Signed)
Patient a&o x3. Denies SI/HI/AVH.Denies intent or plan  to harm self or others. Routine conducted according to faculty protocol .Encourge patient to notify  staff with any needs or concerns. Patient verbalized agreement and understanding . Patient is currently watching TV. Will continue to monitor for safety.

## 2022-02-06 NOTE — ED Notes (Signed)
Patient a&o x3. Denies SI/HI/ AVH.Denies intent or plan  to harm self or others. Routine conducted according to faculty protocol .Encourge patient to notify  staff with any needs or concerns. Patient verbalized agreement and understanding . Patient is currently watching tv.Will continue to monitor for safety.

## 2022-02-06 NOTE — ED Provider Notes (Signed)
Behavioral Health Progress Note  Date and Time: 02/06/2022 9:39 AM Name: Teresa Murillo MRN:  [id number]  Subjective:  Patient states "I am alright."  Teresa Murillo is reassessed, face-to-face, by nurse practitioner.  She is reclined in observation area upon approach, no apparent distress.  He is alert and oriented, pleasant and cooperative during assessment.  She presents with euthymic mood, congruent affect.  Patient continues to deny suicidal and homicidal ideation.  She denies auditory visual hallucinations.  There is no evidence of delusional thought content and no indication that patient is responding to internal stimuli.  Teresa Murillo is pleasant and appropriate.  She endorses average sleep and appetite.  She is engaged with staff and peers.  Teresa Murillo verbalizes understanding of treatment plan to include awaiting disposition.  Patient offered support and encouragement.  Per chart review: Teresa Murillo 61 y.o., female patient with a past psychiatric history of schizophrenia, aggressive behavior, and borderline intellectual disability who initially presented to [facility] on 11/23/2021 via [facility].  At that time patient had been dismissed from the group home due to multiple elopements.  Patient has remained at the [facility] while awaiting new placement.  Social work and DSS are involved.  Diagnosis:  Final diagnoses:  Schizophrenia, unspecified type ([place])  At risk for self care deficit  Noncompliance  Type 2 diabetes mellitus without complication, unspecified whether long term insulin use ([place])    Total Time spent with patient: 20 minutes  Past Psychiatric History: see above Past Medical History:  Past Medical History:  Diagnosis Date   Borderline intellectual functioning 12/14/2021   On 12/14/2021: Appreciate assistance from psychology consult. On the Wechsler Adult Intelligence Scale-4, Ms. Rudzinski achieved a full-scale IQ score of 73 and a percentile rank of 4 placing her in the borderline range of  intellectual functioning.    Chronic obstructive pulmonary disease (COPD) (HCC)    Glaucoma    Hyperlipidemia    Hypertension    Iron deficiency    Schizoaffective disorder (HCC)    Type 2 diabetes mellitus (HCC)     Past Surgical History:  Procedure Laterality Date   TUBAL LIGATION     Family History:  Family History  Problem Relation Age of Onset   Breast cancer Maternal Grandmother    Family Psychiatric  History: see above Social History:  Social History   Substance and Sexual Activity  Alcohol Use Yes     Social History   Substance and Sexual Activity  Drug Use Not Currently    Social History   Socioeconomic History   Marital status: Divorced    Spouse name: Not on file   Number of children: Not on file   Years of education: Not on file   Highest education level: Not on file  Occupational History   Not on file  Tobacco Use   Smoking status: Every Day    Packs/day: 1.00    Types: Cigars, Cigarettes   Smokeless tobacco: Current  Vaping Use   Vaping Use: Never used  Substance and Sexual Activity   Alcohol use: Yes   Drug use: Not Currently   Sexual activity: Not Currently    Birth control/protection: Surgical  Other Topics Concern   Not on file  Social History Narrative   Not on file   Social Determinants of Health   Financial Resource Strain: Not on file  Food Insecurity: Not on file  Transportation Needs: Not on file  Physical Activity: Not on file  Stress: Not on file  Social Connections:  Not on file   SDOH:  SDOH Screenings   Depression (PHQ2-9): Low Risk  (11/23/2021)  Tobacco Use: High Risk (02/03/2022)   Additional Social History:                         Sleep: Good  Appetite:  Good  Current Medications:  Current Facility-Administered Medications  Medication Dose Route Frequency Provider Last Rate Last Admin   acetaminophen (TYLENOL) tablet 650 mg  650 mg Oral Q6H PRN Revonda Humphrey, NP   650 mg at 02/06/22 0808    albuterol (VENTOLIN HFA) 108 (90 Base) MCG/ACT inhaler 2 puff  2 puff Inhalation Q6H PRN Revonda Humphrey, NP   2 puff at 02/05/22 0642   alum & mag hydroxide-simeth (MAALOX/MYLANTA) 200-200-20 MG/5ML suspension 30 mL  30 mL Oral Q4H PRN Revonda Humphrey, NP   30 mL at 02/05/22 2110   apixaban (ELIQUIS) tablet 5 mg  5 mg Oral BID Revonda Humphrey, NP   5 mg at 02/06/22 0933   ARIPiprazole (ABILIFY) tablet 10 mg  10 mg Oral Daily Revonda Humphrey, NP   10 mg at 02/06/22 0933   cloZAPine (CLOZARIL) tablet 50 mg  50 mg Oral Daily Revonda Humphrey, NP   50 mg at 02/06/22 0932   cloZAPine (CLOZARIL) tablet 75 mg  75 mg Oral QHS Revonda Humphrey, NP   75 mg at 02/05/22 2109   diclofenac Sodium (VOLTAREN) 1 % topical gel 2 g  2 g Topical BID PRN Rankin, Shuvon B, NP   2 g at 02/03/22 0920   diltiazem (CARDIZEM CD) 24 hr capsule 240 mg  240 mg Oral Daily Revonda Humphrey, NP   240 mg at 02/06/22 0933   divalproex (DEPAKOTE ER) 24 hr tablet 500 mg  500 mg Oral BID Revonda Humphrey, NP   500 mg at 02/06/22 0932   fluticasone furoate-vilanterol (BREO ELLIPTA) 200-25 MCG/ACT 1 puff  1 puff Inhalation Daily Revonda Humphrey, NP   1 puff at 02/06/22 2353   haloperidol (HALDOL) tablet 10 mg  10 mg Oral TID PRN Revonda Humphrey, NP       hydrOXYzine (ATARAX) tablet 25 mg  25 mg Oral TID PRN Revonda Humphrey, NP   25 mg at 02/05/22 2109   insulin aspart (novoLOG) injection 0-9 Units  0-9 Units Subcutaneous TID WC Revonda Humphrey, NP   1 Units at 02/06/22 0811   insulin glargine-yfgn (SEMGLEE) injection 12 Units  12 Units Subcutaneous Q1200 Revonda Humphrey, NP   12 Units at 02/05/22 1201   latanoprost (XALATAN) 0.005 % ophthalmic solution 1 drop  1 drop Both Eyes QHS Revonda Humphrey, NP   1 drop at 02/05/22 2113   loperamide (IMODIUM) capsule 2-4 mg  2-4 mg Oral PRN Rankin, Shuvon B, NP   4 mg at 02/03/22 1411   magnesium hydroxide (MILK OF MAGNESIA) suspension 30 mL  30 mL Oral Daily  PRN Revonda Humphrey, NP       menthol-cetylpyridinium (CEPACOL) lozenge 3 mg  1 lozenge Oral TID PRN Onuoha, Chinwendu V, NP   3 mg at 02/05/22 2110   metFORMIN (GLUCOPHAGE-XR) 24 hr tablet 1,000 mg  1,000 mg Oral BID WC Revonda Humphrey, NP   1,000 mg at 02/06/22 6144   nicotine (NICODERM CQ - dosed in mg/24 hr) patch 7 mg  7 mg Transdermal Daily Revonda Humphrey, NP   7  mg at 02/06/22 0933   traZODone (DESYREL) tablet 50 mg  50 mg Oral QHS PRN Revonda Humphrey, NP   50 mg at 02/05/22 2109   valbenazine (INGREZZA) capsule 40 mg  40 mg Oral q AM Revonda Humphrey, NP   40 mg at 02/06/22 0630   Vitamin D (Ergocalciferol) (DRISDOL) 1.25 MG (50000 UNIT) capsule 50,000 Units  50,000 Units Oral Q Sharren Bridge, NP   50,000 Units at 02/04/22 0919   Current Outpatient Medications  Medication Sig Dispense Refill   Accu-Chek Softclix Lancets lancets Use as directed up to four times daily 100 each 0   apixaban (ELIQUIS) 5 MG TABS tablet Take 1 tablet (5 mg total) by mouth 2 (two) times daily. 60 tablet 0   ARIPiprazole (ABILIFY) 10 MG tablet Take 1 tablet (10 mg total) by mouth daily. 30 tablet 0   Blood Glucose Monitoring Suppl (ACCU-CHEK GUIDE) w/Device KIT Use as directed up to four times daily 1 kit 0   budesonide-formoterol (SYMBICORT) 160-4.5 MCG/ACT inhaler Inhale 2 puffs into the lungs in the morning and at bedtime.     Cholecalciferol (VITAMIN D3) 1.25 MG (50000 UT) CAPS Take 50,000 Units by mouth every Thursday.     cloZAPine (CLOZARIL) 25 MG tablet Take 3 tablets (75 mg total) by mouth at bedtime. 90 tablet 0   clozapine (CLOZARIL) 50 MG tablet Take 1 tablet (50 mg total) by mouth daily. 30 tablet 0   diltiazem (CARDIZEM CD) 240 MG 24 hr capsule Take 1 capsule (240 mg total) by mouth daily. (Patient not taking: Reported on 11/24/2021) 30 capsule 0   divalproex (DEPAKOTE ER) 500 MG 24 hr tablet Take 1 tablet (500 mg total) by mouth 2 (two) times daily. 60 tablet 0   glucose  blood test strip Use as directed up to four times daily 50 each 0   haloperidol (HALDOL) 10 MG tablet Take 1 tablet (10 mg total) by mouth 3 (three) times daily as needed for agitation (and psychotic symptoms).     INGREZZA 40 MG capsule Take 1 capsule (40 mg total) by mouth in the morning. 30 capsule 0   insulin aspart (NOVOLOG) 100 UNIT/ML FlexPen Before each meal 3 times a day, 140-199 - 2 units, 200-250 - 4 units, 251-299 - 6 units,  300-349 - 8 units,  350 or above 10 units. Insulin PEN if approved, provide syringes and needles if needed.Please switch to any approved short acting Insulin if needed. 15 mL 0   insulin glargine (LANTUS) 100 UNIT/ML Solostar Pen Inject 12 Units into the skin daily. 15 mL 0   Insulin Pen Needle 32G X 4 MM MISC Use 4 times a day with insulin, 1 month supply. 100 each 0   latanoprost (XALATAN) 0.005 % ophthalmic solution Place 1 drop into both eyes at bedtime.     metFORMIN (GLUCOPHAGE) 500 MG tablet Take 500 mg by mouth 2 (two) times daily with a meal.     nicotine (NICODERM CQ - DOSED IN MG/24 HR) 7 mg/24hr patch Place 1 patch (7 mg total) onto the skin daily. 28 patch 0   PROAIR HFA 108 (90 Base) MCG/ACT inhaler Inhale 2 puffs into the lungs every 6 (six) hours as needed for wheezing or shortness of breath.      Labs  Lab Results:  Admission on 01/24/2022  Component Date Value Ref Range Status   Glucose-Capillary 01/24/2022 143 (H)  70 - 99 mg/dL Final   Glucose reference  range applies only to samples taken after fasting for at least 8 hours.   Glucose-Capillary 01/24/2022 120 (H)  70 - 99 mg/dL Final   Glucose reference range applies only to samples taken after fasting for at least 8 hours.   Glucose-Capillary 01/25/2022 122 (H)  70 - 99 mg/dL Final   Glucose reference range applies only to samples taken after fasting for at least 8 hours.   Glucose-Capillary 01/25/2022 190 (H)  70 - 99 mg/dL Final   Glucose reference range applies only to samples taken  after fasting for at least 8 hours.   Glucose-Capillary 01/25/2022 163 (H)  70 - 99 mg/dL Final   Glucose reference range applies only to samples taken after fasting for at least 8 hours.   WBC 01/26/2022 6.5  4.0 - 10.5 K/uL Final   RBC 01/26/2022 4.53  3.87 - 5.11 MIL/uL Final   Hemoglobin 01/26/2022 11.3 (L)  12.0 - 15.0 g/dL Final   HCT 01/26/2022 36.4  36.0 - 46.0 % Final   MCV 01/26/2022 80.4  80.0 - 100.0 fL Final   MCH 01/26/2022 24.9 (L)  26.0 - 34.0 pg Final   MCHC 01/26/2022 31.0  30.0 - 36.0 g/dL Final   RDW 01/26/2022 17.3 (H)  11.5 - 15.5 % Final   Platelets 01/26/2022 327  150 - 400 K/uL Final   nRBC 01/26/2022 0.0  0.0 - 0.2 % Final   Neutrophils Relative % 01/26/2022 60  % Final   Neutro Abs 01/26/2022 3.9  1.7 - 7.7 K/uL Final   Lymphocytes Relative 01/26/2022 31  % Final   Lymphs Abs 01/26/2022 2.0  0.7 - 4.0 K/uL Final   Monocytes Relative 01/26/2022 7  % Final   Monocytes Absolute 01/26/2022 0.5  0.1 - 1.0 K/uL Final   Eosinophils Relative 01/26/2022 1  % Final   Eosinophils Absolute 01/26/2022 0.1  0.0 - 0.5 K/uL Final   Basophils Relative 01/26/2022 1  % Final   Basophils Absolute 01/26/2022 0.1  0.0 - 0.1 K/uL Final   Immature Granulocytes 01/26/2022 0  % Final   Abs Immature Granulocytes 01/26/2022 0.02  0.00 - 0.07 K/uL Final   Performed at [facility], [place] [address] [place], [place] [address]   Glucose-Capillary 01/26/2022 149 (H)  70 - 99 mg/dL Final   Glucose reference range applies only to samples taken after fasting for at least 8 hours.   Glucose-Capillary 01/26/2022 130 (H)  70 - 99 mg/dL Final   Glucose reference range applies only to samples taken after fasting for at least 8 hours.   Glucose-Capillary 01/26/2022 151 (H)  70 - 99 mg/dL Final   Glucose reference range applies only to samples taken after fasting for at least 8 hours.   Glucose-Capillary 01/26/2022 170 (H)  70 - 99 mg/dL Final   Glucose reference range applies only to samples  taken after fasting for at least 8 hours.   Glucose-Capillary 01/27/2022 129 (H)  70 - 99 mg/dL Final   Glucose reference range applies only to samples taken after fasting for at least 8 hours.   Glucose-Capillary 01/27/2022 101 (H)  70 - 99 mg/dL Final   Glucose reference range applies only to samples taken after fasting for at least 8 hours.   Glucose-Capillary 01/27/2022 220 (H)  70 - 99 mg/dL Final   Glucose reference range applies only to samples taken after fasting for at least 8 hours.   Glucose-Capillary 01/27/2022 154 (H)  70 - 99  mg/dL Final   Glucose reference range applies only to samples taken after fasting for at least 8 hours.   Glucose-Capillary 01/27/2022 163 (H)  70 - 99 mg/dL Final   Glucose reference range applies only to samples taken after fasting for at least 8 hours.   Glucose-Capillary 01/28/2022 127 (H)  70 - 99 mg/dL Final   Glucose reference range applies only to samples taken after fasting for at least 8 hours.   Glucose-Capillary 01/28/2022 110 (H)  70 - 99 mg/dL Final   Glucose reference range applies only to samples taken after fasting for at least 8 hours.   Glucose-Capillary 01/28/2022 167 (H)  70 - 99 mg/dL Final   Glucose reference range applies only to samples taken after fasting for at least 8 hours.   Glucose-Capillary 01/29/2022 145 (H)  70 - 99 mg/dL Final   Glucose reference range applies only to samples taken after fasting for at least 8 hours.   Glucose-Capillary 01/29/2022 203 (H)  70 - 99 mg/dL Final   Glucose reference range applies only to samples taken after fasting for at least 8 hours.   Glucose-Capillary 01/29/2022 153 (H)  70 - 99 mg/dL Final   Glucose reference range applies only to samples taken after fasting for at least 8 hours.   Glucose-Capillary 01/29/2022 166 (H)  70 - 99 mg/dL Final   Glucose reference range applies only to samples taken after fasting for at least 8 hours.   Glucose-Capillary 01/30/2022 142 (H)  70 - 99 mg/dL  Final   Glucose reference range applies only to samples taken after fasting for at least 8 hours.   Glucose-Capillary 01/30/2022 138 (H)  70 - 99 mg/dL Final   Glucose reference range applies only to samples taken after fasting for at least 8 hours.   Glucose-Capillary 01/30/2022 185 (H)  70 - 99 mg/dL Final   Glucose reference range applies only to samples taken after fasting for at least 8 hours.   Glucose-Capillary 01/30/2022 220 (H)  70 - 99 mg/dL Final   Glucose reference range applies only to samples taken after fasting for at least 8 hours.   Glucose-Capillary 01/31/2022 147 (H)  70 - 99 mg/dL Final   Glucose reference range applies only to samples taken after fasting for at least 8 hours.   Glucose-Capillary 01/31/2022 131 (H)  70 - 99 mg/dL Final   Glucose reference range applies only to samples taken after fasting for at least 8 hours.   Glucose-Capillary 01/31/2022 169 (H)  70 - 99 mg/dL Final   Glucose reference range applies only to samples taken after fasting for at least 8 hours.   Glucose-Capillary 01/31/2022 144 (H)  70 - 99 mg/dL Final   Glucose reference range applies only to samples taken after fasting for at least 8 hours.   Comment 1 01/31/2022 RN AWARE   Final   Glucose-Capillary 02/01/2022 117 (H)  70 - 99 mg/dL Final   Glucose reference range applies only to samples taken after fasting for at least 8 hours.   Glucose-Capillary 02/01/2022 180 (H)  70 - 99 mg/dL Final   Glucose reference range applies only to samples taken after fasting for at least 8 hours.   Glucose-Capillary 02/01/2022 209 (H)  70 - 99 mg/dL Final   Glucose reference range applies only to samples taken after fasting for at least 8 hours.   Glucose-Capillary 02/02/2022 131 (H)  70 - 99 mg/dL Final   Glucose reference range applies only  to samples taken after fasting for at least 8 hours.   WBC 02/02/2022 7.5  4.0 - 10.5 K/uL Final   RBC 02/02/2022 4.52  3.87 - 5.11 MIL/uL Final   Hemoglobin  02/02/2022 11.3 (L)  12.0 - 15.0 g/dL Final   HCT 02/02/2022 35.0 (L)  36.0 - 46.0 % Final   MCV 02/02/2022 77.4 (L)  80.0 - 100.0 fL Final   MCH 02/02/2022 25.0 (L)  26.0 - 34.0 pg Final   MCHC 02/02/2022 32.3  30.0 - 36.0 g/dL Final   RDW 02/02/2022 16.7 (H)  11.5 - 15.5 % Final   Platelets 02/02/2022 345  150 - 400 K/uL Final   nRBC 02/02/2022 0.0  0.0 - 0.2 % Final   Neutrophils Relative % 02/02/2022 63  % Final   Neutro Abs 02/02/2022 4.7  1.7 - 7.7 K/uL Final   Lymphocytes Relative 02/02/2022 28  % Final   Lymphs Abs 02/02/2022 2.1  0.7 - 4.0 K/uL Final   Monocytes Relative 02/02/2022 7  % Final   Monocytes Absolute 02/02/2022 0.5  0.1 - 1.0 K/uL Final   Eosinophils Relative 02/02/2022 1  % Final   Eosinophils Absolute 02/02/2022 0.1  0.0 - 0.5 K/uL Final   Basophils Relative 02/02/2022 1  % Final   Basophils Absolute 02/02/2022 0.1  0.0 - 0.1 K/uL Final   Immature Granulocytes 02/02/2022 0  % Final   Abs Immature Granulocytes 02/02/2022 0.03  0.00 - 0.07 K/uL Final   Performed at [facility], [place] [address], [place], [place] [address]   Glucose-Capillary 02/02/2022 95  70 - 99 mg/dL Final   Glucose reference range applies only to samples taken after fasting for at least 8 hours.   Glucose-Capillary 02/02/2022 120 (H)  70 - 99 mg/dL Final   Glucose reference range applies only to samples taken after fasting for at least 8 hours.   Glucose-Capillary 02/02/2022 191 (H)  70 - 99 mg/dL Final   Glucose reference range applies only to samples taken after fasting for at least 8 hours.   Glucose-Capillary 02/03/2022 148 (H)  70 - 99 mg/dL Final   Glucose reference range applies only to samples taken after fasting for at least 8 hours.   Glucose-Capillary 02/03/2022 122 (H)  70 - 99 mg/dL Final   Glucose reference range applies only to samples taken after fasting for at least 8 hours.   Glucose-Capillary 02/03/2022 233 (H)  70 - 99 mg/dL Final   Glucose reference range applies  only to samples taken after fasting for at least 8 hours.   Glucose-Capillary 02/04/2022 126 (H)  70 - 99 mg/dL Final   Glucose reference range applies only to samples taken after fasting for at least 8 hours.   Glucose-Capillary 02/04/2022 117 (H)  70 - 99 mg/dL Final   Glucose reference range applies only to samples taken after fasting for at least 8 hours.   Glucose-Capillary 02/04/2022 135 (H)  70 - 99 mg/dL Final   Glucose reference range applies only to samples taken after fasting for at least 8 hours.   Glucose-Capillary 02/04/2022 197 (H)  70 - 99 mg/dL Final   Glucose reference range applies only to samples taken after fasting for at least 8 hours.   Glucose-Capillary 02/05/2022 170 (H)  70 - 99 mg/dL Final   Glucose reference range applies only to samples taken after fasting for at least 8 hours.   Glucose-Capillary 02/05/2022 107 (H)  70 - 99 mg/dL Final  Glucose reference range applies only to samples taken after fasting for at least 8 hours.   Glucose-Capillary 02/05/2022 184 (H)  70 - 99 mg/dL Final   Glucose reference range applies only to samples taken after fasting for at least 8 hours.   Glucose-Capillary 02/05/2022 256 (H)  70 - 99 mg/dL Final   Glucose reference range applies only to samples taken after fasting for at least 8 hours.   Glucose-Capillary 02/06/2022 144 (H)  70 - 99 mg/dL Final   Glucose reference range applies only to samples taken after fasting for at least 8 hours.  Admission on 01/23/2022, Discharged on 01/24/2022  Component Date Value Ref Range Status   Sodium 01/23/2022 140  135 - 145 mmol/L Final   Potassium 01/23/2022 4.4  3.5 - 5.1 mmol/L Final   Chloride 01/23/2022 101  98 - 111 mmol/L Final   CO2 01/23/2022 25  22 - 32 mmol/L Final   Glucose, Bld 01/23/2022 198 (H)  70 - 99 mg/dL Final   Glucose reference range applies only to samples taken after fasting for at least 8 hours.   BUN 01/23/2022 13  6 - 20 mg/dL Final   Creatinine, Ser 01/23/2022  0.62  0.44 - 1.00 mg/dL Final   Calcium 01/23/2022 9.3  8.9 - 10.3 mg/dL Final   GFR, Estimated 01/23/2022 >60  >60 mL/min Final   Comment: (NOTE) Calculated using the CKD-EPI Creatinine Equation (2021)    Anion gap 01/23/2022 14  5 - 15 Final   Performed at [facility], [place] [address] [place], [place] [address]   WBC 01/23/2022 5.8  4.0 - 10.5 K/uL Final   RBC 01/23/2022 4.63  3.87 - 5.11 MIL/uL Final   Hemoglobin 01/23/2022 11.7 (L)  12.0 - 15.0 g/dL Final   HCT 01/23/2022 37.4  36.0 - 46.0 % Final   MCV 01/23/2022 80.8  80.0 - 100.0 fL Final   MCH 01/23/2022 25.3 (L)  26.0 - 34.0 pg Final   MCHC 01/23/2022 31.3  30.0 - 36.0 g/dL Final   RDW 01/23/2022 17.9 (H)  11.5 - 15.5 % Final   Platelets 01/23/2022 333  150 - 400 K/uL Final   nRBC 01/23/2022 0.0  0.0 - 0.2 % Final   Performed at [place] [address], [place], [place] [address]   Troponin I (High Sensitivity) 01/23/2022 6  <18 ng/L Final   Comment: (NOTE) Elevated high sensitivity troponin I (hsTnI) values and significant  changes across serial measurements may suggest ACS but many other  chronic and acute conditions are known to elevate hsTnI results.  Refer to the "Links" section for chest pain algorithms and additional  guidance. Performed at [facility], [place] [address], [place], [place] [address]    Troponin I (High Sensitivity) 01/23/2022 5  <18 ng/L Final   Comment: (NOTE) Elevated high sensitivity troponin I (hsTnI) values and significant  changes across serial measurements may suggest ACS but many other  chronic and acute conditions are known to elevate hsTnI results.  Refer to the "Links" section for chest pain algorithms and additional  guidance. Performed at [facility], [place] [address], [place], [place] [address]    SARS Coronavirus 2 by RT PCR 01/23/2022 NEGATIVE  NEGATIVE Final   Comment: (NOTE) SARS-CoV-2 target nucleic acids are NOT DETECTED.  The SARS-CoV-2 RNA is  generally detectable in upper and lower respiratory specimens during the acute phase of infection. The lowest concentration of SARS-CoV-2  viral copies this assay can detect is 250 copies / mL. A negative result does not preclude SARS-CoV-2 infection and should not be used as the sole basis for treatment or other patient management decisions.  A negative result may occur with improper specimen collection / handling, submission of specimen other than nasopharyngeal swab, presence of viral mutation(s) within the areas targeted by this assay, and inadequate number of viral copies (<250 copies / mL). A negative result must be combined with clinical observations, patient history, and epidemiological information.  Fact Sheet for Patients:   https://www.patel.info/  Fact Sheet for Healthcare Providers: https://hall.com/  This test is not yet approved or                           cleared by the [place] FDA and has been authorized for detection and/or diagnosis of SARS-CoV-2 by FDA under an Emergency Use Authorization (EUA).  This EUA will remain in effect (meaning this test can be used) for the duration of the COVID-19 declaration under Section 564(b)(1) of the Act, 21 U.S.C. section 360bbb-3(b)(1), unless the authorization is terminated or revoked sooner.  Performed at [facility], [place] [address] [place], [place] [address]    B Natriuretic Peptide 01/23/2022 41.3  0.0 - 100.0 pg/mL Final   Performed at [place] [address], [place], [place] [address]   Group A Strep by PCR 01/23/2022 NOT DETECTED  NOT DETECTED Final   Performed at [facility], [place] [address], [place], [place] [address]   Color, Urine 01/23/2022 YELLOW  YELLOW Final   APPearance 01/23/2022 CLEAR  CLEAR Final   Specific Gravity, Urine 01/23/2022 1.018  1.005 - 1.030 Final   pH 01/23/2022 7.0  5.0 - 8.0 Final   Glucose, UA 01/23/2022 NEGATIVE   NEGATIVE mg/dL Final   Hgb urine dipstick 01/23/2022 NEGATIVE  NEGATIVE Final   Bilirubin Urine 01/23/2022 NEGATIVE  NEGATIVE Final   Ketones, ur 01/23/2022 NEGATIVE  NEGATIVE mg/dL Final   Protein, ur 01/23/2022 NEGATIVE  NEGATIVE mg/dL Final   Nitrite 01/23/2022 NEGATIVE  NEGATIVE Final   Leukocytes,Ua 01/23/2022 TRACE (A)  NEGATIVE Final   RBC / HPF 01/23/2022 0-5  0 - 5 RBC/hpf Final   WBC, UA 01/23/2022 0-5  0 - 5 WBC/hpf Final   Bacteria, UA 01/23/2022 NONE SEEN  NONE SEEN Final   Squamous Epithelial / LPF 01/23/2022 0-5  0 - 5 Final   Mucus 01/23/2022 PRESENT   Final   Performed at [facility], [place] [address] [place], [place] [address]   SARS Coronavirus 2 by RT PCR 01/23/2022 NEGATIVE  NEGATIVE Final   Comment: (NOTE) SARS-CoV-2 target nucleic acids are NOT DETECTED.  The SARS-CoV-2 RNA is generally detectable in upper respiratory specimens during the acute phase of infection. The lowest concentration of SARS-CoV-2 viral copies this assay can detect is 138 copies/mL. A negative result does not preclude SARS-Cov-2 infection and should not be used as the sole basis for treatment or other patient management decisions. A negative result may occur with  improper specimen collection/handling, submission of specimen other than nasopharyngeal swab, presence of viral mutation(s) within the areas targeted by this assay, and inadequate number of viral copies(<138 copies/mL). A negative result must be combined with clinical observations, patient history, and epidemiological information. The expected result is Negative.  Fact Sheet for Patients:  EntrepreneurPulse.com.au  Fact Sheet for Healthcare Providers:  IncredibleEmployment.be  This  test is no                          t yet approved or cleared by the [place] and  has been authorized for detection and/or diagnosis of SARS-CoV-2 by FDA under an Emergency Use Authorization  (EUA). This EUA will remain  in effect (meaning this test can be used) for the duration of the COVID-19 declaration under Section 564(b)(1) of the Act, 21 U.S.C.section 360bbb-3(b)(1), unless the authorization is terminated  or revoked sooner.       Influenza A by PCR 01/23/2022 NEGATIVE  NEGATIVE Final   Influenza B by PCR 01/23/2022 NEGATIVE  NEGATIVE Final   Comment: (NOTE) The Xpert Xpress SARS-CoV-2/FLU/RSV plus assay is intended as an aid in the diagnosis of influenza from Nasopharyngeal swab specimens and should not be used as a sole basis for treatment. Nasal washings and aspirates are unacceptable for Xpert Xpress SARS-CoV-2/FLU/RSV testing.  Fact Sheet for Patients: EntrepreneurPulse.com.au  Fact Sheet for Healthcare Providers: IncredibleEmployment.be  This test is not yet approved or cleared by the [place] FDA and has been authorized for detection and/or diagnosis of SARS-CoV-2 by FDA under an Emergency Use Authorization (EUA). This EUA will remain in effect (meaning this test can be used) for the duration of the COVID-19 declaration under Section 564(b)(1) of the Act, 21 U.S.C. section 360bbb-3(b)(1), unless the authorization is terminated or revoked.  Performed at [facility], [place] [address] [place], [place] [address]    WBC 01/24/2022 6.1  4.0 - 10.5 K/uL Final   RBC 01/24/2022 4.60  3.87 - 5.11 MIL/uL Final   Hemoglobin 01/24/2022 11.7 (L)  12.0 - 15.0 g/dL Final   HCT 01/24/2022 37.0  36.0 - 46.0 % Final   MCV 01/24/2022 80.4  80.0 - 100.0 fL Final   MCH 01/24/2022 25.4 (L)  26.0 - 34.0 pg Final   MCHC 01/24/2022 31.6  30.0 - 36.0 g/dL Final   RDW 01/24/2022 17.6 (H)  11.5 - 15.5 % Final   Platelets 01/24/2022 334  150 - 400 K/uL Final   nRBC 01/24/2022 0.0  0.0 - 0.2 % Final   Neutrophils Relative % 01/24/2022 53  % Final   Neutro Abs 01/24/2022 3.3  1.7 - 7.7 K/uL Final   Lymphocytes Relative 01/24/2022  33  % Final   Lymphs Abs 01/24/2022 2.0  0.7 - 4.0 K/uL Final   Monocytes Relative 01/24/2022 11  % Final   Monocytes Absolute 01/24/2022 0.7  0.1 - 1.0 K/uL Final   Eosinophils Relative 01/24/2022 2  % Final   Eosinophils Absolute 01/24/2022 0.1  0.0 - 0.5 K/uL Final   Basophils Relative 01/24/2022 1  % Final   Basophils Absolute 01/24/2022 0.1  0.0 - 0.1 K/uL Final   Immature Granulocytes 01/24/2022 0  % Final   Abs Immature Granulocytes 01/24/2022 0.02  0.00 - 0.07 K/uL Final   Performed at [facility], [place] [address], [place], [place] [address]   Glucose-Capillary 01/24/2022 110 (H)  70 - 99 mg/dL Final   Glucose reference range applies only to samples taken after fasting for at least 8 hours.  No results displayed because visit has over 200 results.    Admission on 11/22/2021, Discharged on 11/22/2021  Component Date Value Ref Range Status   Glucose-Capillary 11/22/2021 169 (H)  70 - 99 mg/dL Final   Glucose reference range applies only to samples taken after fasting  for at least 8 hours.  No results displayed because visit has over 200 results.    Admission on 10/04/2021, Discharged on 10/22/2021  Component Date Value Ref Range Status   SARS Coronavirus 2 by RT PCR 10/04/2021 NEGATIVE  NEGATIVE Final   Comment: (NOTE) SARS-CoV-2 target nucleic acids are NOT DETECTED.  The SARS-CoV-2 RNA is generally detectable in upper respiratory specimens during the acute phase of infection. The lowest concentration of SARS-CoV-2 viral copies this assay can detect is 138 copies/mL. A negative result does not preclude SARS-Cov-2 infection and should not be used as the sole basis for treatment or other patient management decisions. A negative result may occur with  improper specimen collection/handling, submission of specimen other than nasopharyngeal swab, presence of viral mutation(s) within the areas targeted by this assay, and inadequate number of viral copies(<138 copies/mL). A  negative result must be combined with clinical observations, patient history, and epidemiological information. The expected result is Negative.  Fact Sheet for Patients:  EntrepreneurPulse.com.au  Fact Sheet for Healthcare Providers:  IncredibleEmployment.be  This test is no                          t yet approved or cleared by the [place] FDA and  has been authorized for detection and/or diagnosis of SARS-CoV-2 by FDA under an Emergency Use Authorization (EUA). This EUA will remain  in effect (meaning this test can be used) for the duration of the COVID-19 declaration under Section 564(b)(1) of the Act, 21 U.S.C.section 360bbb-3(b)(1), unless the authorization is terminated  or revoked sooner.       Influenza A by PCR 10/04/2021 NEGATIVE  NEGATIVE Final   Influenza B by PCR 10/04/2021 NEGATIVE  NEGATIVE Final   Comment: (NOTE) The Xpert Xpress SARS-CoV-2/FLU/RSV plus assay is intended as an aid in the diagnosis of influenza from Nasopharyngeal swab specimens and should not be used as a sole basis for treatment. Nasal washings and aspirates are unacceptable for Xpert Xpress SARS-CoV-2/FLU/RSV testing.  Fact Sheet for Patients: EntrepreneurPulse.com.au  Fact Sheet for Healthcare Providers: IncredibleEmployment.be  This test is not yet approved or cleared by the [place] FDA and has been authorized for detection and/or diagnosis of SARS-CoV-2 by FDA under an Emergency Use Authorization (EUA). This EUA will remain in effect (meaning this test can be used) for the duration of the COVID-19 declaration under Section 564(b)(1) of the Act, 21 U.S.C. section 360bbb-3(b)(1), unless the authorization is terminated or revoked.  Performed at [facility], [place] [address], [place], [place] [address]    WBC 10/04/2021 8.4  4.0 - 10.5 K/uL Final   RBC 10/04/2021 4.42  3.87 - 5.11 MIL/uL Final    Hemoglobin 10/04/2021 11.6 (L)  12.0 - 15.0 g/dL Final   HCT 10/04/2021 35.7 (L)  36.0 - 46.0 % Final   MCV 10/04/2021 80.8  80.0 - 100.0 fL Final   MCH 10/04/2021 26.2  26.0 - 34.0 pg Final   MCHC 10/04/2021 32.5  30.0 - 36.0 g/dL Final   RDW 10/04/2021 16.0 (H)  11.5 - 15.5 % Final   Platelets 10/04/2021 372  150 - 400 K/uL Final   nRBC 10/04/2021 0.0  0.0 - 0.2 % Final   Neutrophils Relative % 10/04/2021 68  % Final   Neutro Abs 10/04/2021 5.7  1.7 - 7.7 K/uL Final   Lymphocytes Relative 10/04/2021 22  % Final   Lymphs Abs 10/04/2021 1.8  0.7 - 4.0  K/uL Final   Monocytes Relative 10/04/2021 8  % Final   Monocytes Absolute 10/04/2021 0.7  0.1 - 1.0 K/uL Final   Eosinophils Relative 10/04/2021 1  % Final   Eosinophils Absolute 10/04/2021 0.1  0.0 - 0.5 K/uL Final   Basophils Relative 10/04/2021 1  % Final   Basophils Absolute 10/04/2021 0.1  0.0 - 0.1 K/uL Final   Immature Granulocytes 10/04/2021 0  % Final   Abs Immature Granulocytes 10/04/2021 0.03  0.00 - 0.07 K/uL Final   Performed at [facility], [place] [address] [place], [place] [address]   Sodium 10/04/2021 136  135 - 145 mmol/L Final   Potassium 10/04/2021 4.2  3.5 - 5.1 mmol/L Final   Chloride 10/04/2021 104  98 - 111 mmol/L Final   CO2 10/04/2021 25  22 - 32 mmol/L Final   Glucose, Bld 10/04/2021 100 (H)  70 - 99 mg/dL Final   Glucose reference range applies only to samples taken after fasting for at least 8 hours.   BUN 10/04/2021 9  6 - 20 mg/dL Final   Creatinine, Ser 10/04/2021 0.52  0.44 - 1.00 mg/dL Final   Calcium 10/04/2021 9.0  8.9 - 10.3 mg/dL Final   Total Protein 10/04/2021 7.0  6.5 - 8.1 g/dL Final   Albumin 10/04/2021 3.2 (L)  3.5 - 5.0 g/dL Final   AST 10/04/2021 13 (L)  15 - 41 U/L Final   ALT 10/04/2021 10  0 - 44 U/L Final   Alkaline Phosphatase 10/04/2021 61  38 - 126 U/L Final   Total Bilirubin 10/04/2021 0.3  0.3 - 1.2 mg/dL Final   GFR, Estimated 10/04/2021 >60  >60 mL/min Final   Comment:  (NOTE) Calculated using the CKD-EPI Creatinine Equation (2021)    Anion gap 10/04/2021 7  5 - 15 Final   Performed at [facility] [address], [place], [place] [address]   Hgb A1c MFr Bld 10/04/2021 7.0 (H)  4.8 - 5.6 % Final   Comment: (NOTE) Pre diabetes:          5.7%-6.4%  Diabetes:              >6.4%  Glycemic control for   <7.0% adults with diabetes    Mean Plasma Glucose 10/04/2021 154.2  mg/dL Final   Performed at [facility], [place] [address], [place], [place] [address]   Cholesterol 10/04/2021 178  0 - 200 mg/dL Final   Triglycerides 10/04/2021 155 (H)  <150 mg/dL Final   HDL 10/04/2021 52  >40 mg/dL Final   Total CHOL/HDL Ratio 10/04/2021 3.4  RATIO Final   VLDL 10/04/2021 31  0 - 40 mg/dL Final   LDL Cholesterol 10/04/2021 95  0 - 99 mg/dL Final   Comment:        Total Cholesterol/HDL:CHD Risk Coronary Heart Disease Risk Table                     Men   Women  1/2 Average Risk   3.4   3.3  Average Risk       5.0   4.4  2 X Average Risk   9.6   7.1  3 X Average Risk  23.4   11.0        Use the calculated Patient Ratio above and the CHD Risk Table to determine the patient's CHD Risk.        ATP III CLASSIFICATION (LDL):  <100  mg/dL   Optimal  100-129  mg/dL   Near or Above                    Optimal  130-159  mg/dL   Borderline  160-189  mg/dL   High  >190     mg/dL   Very High Performed at [place] [address], [place], [place] [address]    POC Amphetamine UR 10/04/2021 None Detected  NONE DETECTED (Cut Off Level 1000 ng/mL) Final   POC Secobarbital (BAR) 10/04/2021 None Detected  NONE DETECTED (Cut Off Level 300 ng/mL) Final   POC Buprenorphine (BUP) 10/04/2021 None Detected  NONE DETECTED (Cut Off Level 10 ng/mL) Final   POC Oxazepam (BZO) 10/04/2021 None Detected  NONE DETECTED (Cut Off Level 300 ng/mL) Final   POC Cocaine UR 10/04/2021 None Detected  NONE DETECTED (Cut Off Level 300 ng/mL) Final   POC Methamphetamine UR  10/04/2021 None Detected  NONE DETECTED (Cut Off Level 1000 ng/mL) Final   POC Morphine 10/04/2021 None Detected  NONE DETECTED (Cut Off Level 300 ng/mL) Final   POC Methadone UR 10/04/2021 None Detected  NONE DETECTED (Cut Off Level 300 ng/mL) Final   POC Oxycodone UR 10/04/2021 Positive (A)  NONE DETECTED (Cut Off Level 100 ng/mL) Final   POC Marijuana UR 10/04/2021 None Detected  NONE DETECTED (Cut Off Level 50 ng/mL) Final   SARSCOV2ONAVIRUS 2 AG 10/04/2021 NEGATIVE  NEGATIVE Final   Comment: (NOTE) SARS-CoV-2 antigen NOT DETECTED.   Negative results are presumptive.  Negative results do not preclude SARS-CoV-2 infection and should not be used as the sole basis for treatment or other patient management decisions, including infection  control decisions, particularly in the presence of clinical signs and  symptoms consistent with COVID-19, or in those who have been in contact with the virus.  Negative results must be combined with clinical observations, patient history, and epidemiological information. The expected result is Negative.  Fact Sheet for Patients: HandmadeRecipes.com.cy  Fact Sheet for Healthcare Providers: FuneralLife.at  This test is not yet approved or cleared by the [place] FDA and  has been authorized for detection and/or diagnosis of SARS-CoV-2 by FDA under an Emergency Use Authorization (EUA).  This EUA will remain in effect (meaning this test can be used) for the duration of  the COV                          ID-19 declaration under Section 564(b)(1) of the Act, 21 U.S.C. section 360bbb-3(b)(1), unless the authorization is terminated or revoked sooner.     Valproic Acid Lvl 10/04/2021 51  50.0 - 100.0 ug/mL Final   Performed at [facility] [address] [place], [place] [address]   Valproic Acid Lvl 10/08/2021 57  50.0 - 100.0 ug/mL Final   Performed at [facility] [address],  [place], [place] [address]   Glucose-Capillary 10/09/2021 123 (H)  70 - 99 mg/dL Final   Glucose reference range applies only to samples taken after fasting for at least 8 hours.  Admission on 09/09/2021, Discharged on 09/10/2021  Component Date Value Ref Range Status   Sodium 09/09/2021 134 (L)  135 - 145 mmol/L Final   Potassium 09/09/2021 4.3  3.5 - 5.1 mmol/L Final   Chloride 09/09/2021 99  98 - 111 mmol/L Final   CO2 09/09/2021 25  22 - 32 mmol/L Final   Glucose, Bld  09/09/2021 107 (H)  70 - 99 mg/dL Final   Glucose reference range applies only to samples taken after fasting for at least 8 hours.   BUN 09/09/2021 12  6 - 20 mg/dL Final   Creatinine, Ser 09/09/2021 0.74  0.44 - 1.00 mg/dL Final   Calcium 09/09/2021 9.0  8.9 - 10.3 mg/dL Final   Total Protein 09/09/2021 7.4  6.5 - 8.1 g/dL Final   Albumin 09/09/2021 3.1 (L)  3.5 - 5.0 g/dL Final   AST 09/09/2021 13 (L)  15 - 41 U/L Final   ALT 09/09/2021 13  0 - 44 U/L Final   Alkaline Phosphatase 09/09/2021 66  38 - 126 U/L Final   Total Bilirubin 09/09/2021 0.2 (L)  0.3 - 1.2 mg/dL Final   GFR, Estimated 09/09/2021 >60  >60 mL/min Final   Comment: (NOTE) Calculated using the CKD-EPI Creatinine Equation (2021)    Anion gap 09/09/2021 10  5 - 15 Final   Performed at [facility], [place] [address], [place], [place] [address]   WBC 09/09/2021 8.5  4.0 - 10.5 K/uL Final   RBC 09/09/2021 4.53  3.87 - 5.11 MIL/uL Final   Hemoglobin 09/09/2021 11.8 (L)  12.0 - 15.0 g/dL Final   HCT 09/09/2021 38.0  36.0 - 46.0 % Final   MCV 09/09/2021 83.9  80.0 - 100.0 fL Final   MCH 09/09/2021 26.0  26.0 - 34.0 pg Final   MCHC 09/09/2021 31.1  30.0 - 36.0 g/dL Final   RDW 09/09/2021 17.8 (H)  11.5 - 15.5 % Final   Platelets 09/09/2021 442 (H)  150 - 400 K/uL Final   nRBC 09/09/2021 0.0  0.0 - 0.2 % Final   Neutrophils Relative % 09/09/2021 70  % Final   Neutro Abs 09/09/2021 5.9  1.7 - 7.7 K/uL Final   Lymphocytes Relative 09/09/2021 20  % Final    Lymphs Abs 09/09/2021 1.7  0.7 - 4.0 K/uL Final   Monocytes Relative 09/09/2021 8  % Final   Monocytes Absolute 09/09/2021 0.7  0.1 - 1.0 K/uL Final   Eosinophils Relative 09/09/2021 1  % Final   Eosinophils Absolute 09/09/2021 0.1  0.0 - 0.5 K/uL Final   Basophils Relative 09/09/2021 1  % Final   Basophils Absolute 09/09/2021 0.1  0.0 - 0.1 K/uL Final   Immature Granulocytes 09/09/2021 0  % Final   Abs Immature Granulocytes 09/09/2021 0.03  0.00 - 0.07 K/uL Final   Performed at [facility], [place] [address], [place], [place] [address]   Ammonia 09/09/2021 37 (H)  9 - 35 umol/L Final   Comment: HEMOLYSIS AT THIS LEVEL MAY AFFECT RESULT Performed at [facility], [place] [address], [place], [place] [address]    Opiates 09/09/2021 NONE DETECTED  NONE DETECTED Final   Cocaine 09/09/2021 NONE DETECTED  NONE DETECTED Final   Benzodiazepines 09/09/2021 NONE DETECTED  NONE DETECTED Final   Amphetamines 09/09/2021 NONE DETECTED  NONE DETECTED Final   Tetrahydrocannabinol 09/09/2021 NONE DETECTED  NONE DETECTED Final   Barbiturates 09/09/2021 NONE DETECTED  NONE DETECTED Final   Comment: (NOTE) DRUG SCREEN FOR MEDICAL PURPOSES ONLY.  IF CONFIRMATION IS NEEDED FOR ANY PURPOSE, NOTIFY LAB WITHIN 5 DAYS.  LOWEST DETECTABLE LIMITS FOR URINE DRUG SCREEN Drug Class                     Cutoff (ng/mL) Amphetamine and metabolites    1000 Barbiturate and metabolites  200 Benzodiazepine                 314 Tricyclics and metabolites     300 Opiates and metabolites        300 Cocaine and metabolites        300 THC                            50 Performed at [facility], [place] [address] [place], [place] [address]    Alcohol, Ethyl (B) 09/09/2021 <10  <10 mg/dL Final   Comment: (NOTE) Lowest detectable limit for serum alcohol is 10 mg/dL.  For medical purposes only. Performed at [facility], [place] [address], [place], [place] [address]    Lipase 09/09/2021 33  11 - 51 U/L  Final   Performed at [facility] [address] [place], [place] [address]   Color, Urine 09/09/2021 COLORLESS (A)  YELLOW Final   APPearance 09/09/2021 CLEAR  CLEAR Final   Specific Gravity, Urine 09/09/2021 1.002 (L)  1.005 - 1.030 Final   pH 09/09/2021 6.0  5.0 - 8.0 Final   Glucose, UA 09/09/2021 NEGATIVE  NEGATIVE mg/dL Final   Hgb urine dipstick 09/09/2021 NEGATIVE  NEGATIVE Final   Bilirubin Urine 09/09/2021 NEGATIVE  NEGATIVE Final   Ketones, ur 09/09/2021 NEGATIVE  NEGATIVE mg/dL Final   Protein, ur 09/09/2021 NEGATIVE  NEGATIVE mg/dL Final   Nitrite 09/09/2021 NEGATIVE  NEGATIVE Final   Leukocytes,Ua 09/09/2021 NEGATIVE  NEGATIVE Final   Performed at [facility] [address], [place], [place] [address]   Specimen Description 09/09/2021 URINE, CLEAN CATCH   Final   Special Requests 09/09/2021 NONE   Final   Culture 09/09/2021  (A)   Final                   Value:<10,000 COLONIES/mL INSIGNIFICANT GROWTH Performed at [facility], [place] [address], [place], [place] [address]    Report Status 09/09/2021 09/10/2021 FINAL   Final   D-Dimer, Quant 09/09/2021 0.92 (H)  0.00 - 0.50 ug/mL-FEU Final   Comment: (NOTE) At the manufacturer cut-off value of 0.5 g/mL FEU, this assay has a negative predictive value of 95-100%.This assay is intended for use in conjunction with a clinical pretest probability (PTP) assessment model to exclude pulmonary embolism (PE) and deep venous thrombosis (DVT) in outpatients suspected of PE or DVT. Results should be correlated with clinical presentation. Performed at [facility], [place] [address] [place], [place] [address]    Troponin I (High Sensitivity) 09/09/2021 5  <18 ng/L Final   Comment: (NOTE) Elevated high sensitivity troponin I (hsTnI) values and significant  changes across serial measurements may suggest ACS but many other  chronic and acute conditions are known to elevate hsTnI results.  Refer to the "Links"  section for chest pain algorithms and additional  guidance. Performed at [facility], [place] [address] [place], [place] [address]    BP 09/10/2021 135/83  mmHg Final   S' Lateral 09/10/2021 2.30  cm Final   Area-P 1/2 09/10/2021 5.58  cm2 Final    Blood Alcohol level:  Lab Results  Component Value Date   ETH <10 11/23/2021   ETH <10 83/66/2947    Metabolic Disorder Labs: Lab Results  Component Value Date   HGBA1C 6.7 (H) 12/28/2021   MPG 145.59 12/28/2021   MPG 154.2 10/04/2021  Lab Results  Component Value Date   PROLACTIN 3.8 (L) 11/23/2021   Lab Results  Component Value Date   CHOL 171 11/23/2021   TRIG 125 11/23/2021   HDL 65 11/23/2021   CHOLHDL 2.6 11/23/2021   VLDL 25 11/23/2021   LDLCALC 81 11/23/2021   LDLCALC 95 10/04/2021    Therapeutic Lab Levels: No results found for: "LITHIUM" Lab Results  Component Value Date   VALPROATE 33 (L) 01/18/2022   VALPROATE 33 (L) 12/28/2021   No results found for: "CBMZ"  Physical Findings   PHQ2-9    Flowsheet Row ED from 11/23/2021 in [facility]  PHQ-2 Total Score 2  PHQ-9 Total Score 3      Flowsheet Row ED from 01/24/2022 in [facility] ED from 01/23/2022 in [facility] ED from 11/23/2021 in [facility] No Risk No Risk No Risk        Musculoskeletal  Strength & Muscle Tone: within normal limits Gait & Station: normal Patient leans: N/A  Psychiatric Specialty Exam  Presentation  General Appearance:  Appropriate for Environment; Casual  Eye Contact: Good  Speech: Clear and Coherent; Normal Rate  Speech Volume: Normal  Handedness: Right   Mood and Affect  Mood: Euthymic  Affect: Appropriate; Congruent   Thought Process  Thought Processes: Coherent; Goal Directed; Linear  Descriptions of Associations:Intact  Orientation:Full  (Time, Place and Person)  Thought Content:Logical; WDL  Diagnosis of Schizophrenia or Schizoaffective disorder in past: Yes  Duration of Psychotic Symptoms: No data recorded  Hallucinations:Hallucinations: None  Ideas of Reference:None  Suicidal Thoughts:Suicidal Thoughts: No  Homicidal Thoughts:Homicidal Thoughts: No   Sensorium  Memory: Immediate Good  Judgment: Intact  Insight: Present   Executive Functions  Concentration: Fair  Attention Span: Good  Recall: Good  Fund of Knowledge: Good  Language: Good   Psychomotor Activity  Psychomotor Activity: Psychomotor Activity: Normal   Assets  Assets: Communication Skills; Desire for Improvement   Sleep  Sleep: Sleep: Fair   No data recorded  Physical Exam  Physical Exam Vitals and nursing note reviewed.  Constitutional:      Appearance: Normal appearance. She is well-developed.  HENT:     Head: Normocephalic and atraumatic.     Nose: Nose normal.  Cardiovascular:     Rate and Rhythm: Normal rate.  Pulmonary:     Effort: Pulmonary effort is normal.  Musculoskeletal:        General: Normal range of motion.     Cervical back: Normal range of motion.  Skin:    General: Skin is warm and dry.  Neurological:     Mental Status: She is alert and oriented to person, place, and time.  Psychiatric:        Attention and Perception: Attention and perception normal.        Mood and Affect: Mood and affect normal.        Speech: Speech normal.        Behavior: Behavior normal. Behavior is cooperative.        Thought Content: Thought content normal.        Cognition and Memory: Cognition normal.    Review of Systems  Constitutional: Negative.   HENT: Negative.    Eyes: Negative.   Respiratory: Negative.    Cardiovascular: Negative.   Gastrointestinal: Negative.   Genitourinary: Negative.   Musculoskeletal: Negative.   Skin: Negative.   Neurological:  Negative.   Psychiatric/Behavioral:  Negative.     Blood pressure 133/78, pulse (!) 109, temperature 97.7 F (36.5 C), temperature source Oral, resp. rate 20, SpO2 98 %. There is no height or weight on file to calculate BMI.  Treatment Plan Summary: Patient reviewed with Dr. Darleene Cleaver.  She remains cleared by psychiatry.  TOC team continuing to seek safe disposition plan. Daily contact with patient to assess and evaluate symptoms and progress in treatment  Lucky Rathke, FNP 02/06/2022 9:39 AM

## 2022-02-06 NOTE — ED Notes (Signed)
Provider at bedside

## 2022-02-06 NOTE — ED Notes (Signed)
Patient states that she has a boil on her left  breast. States that she had some drainage from the boil. Patients breast was accessed by 2 Rn's.  The breast had a hard spot that  was a  little red in color. Rn did not witness any drainage. Notified provider .Will continue to monitor.

## 2022-02-06 NOTE — ED Notes (Signed)
Patient a&o x3. Denies SI/HI/AVH.Marland KitchenDenies intent or plan  to harm self or others. Routine conducted according to faculty protocol .Encourge patient to notify  staff with any needs or concerns. Patient verbalized agreement and understanding .Will continue to monitor for safety.

## 2022-02-06 NOTE — ED Notes (Addendum)
Patient a&o x3. Denies SI/HI/AVH. Denies intent or plan  to harm self or others. Routine conducted according to faculty protocol .Encourge patient to notify  staff with any needs or concerns. Patient verbalized agreement and understanding.Will continue to monitor for safety. Patient is watching TV .

## 2022-02-07 DIAGNOSIS — F209 Schizophrenia, unspecified: Secondary | ICD-10-CM | POA: Diagnosis not present

## 2022-02-07 LAB — GLUCOSE, CAPILLARY
Glucose-Capillary: 129 mg/dL — ABNORMAL HIGH (ref 70–99)
Glucose-Capillary: 207 mg/dL — ABNORMAL HIGH (ref 70–99)
Glucose-Capillary: 153 mg/dL — ABNORMAL HIGH (ref 70–99)

## 2022-02-07 NOTE — ED Notes (Signed)
Patient a&o x3. Denies SI/HI/AVH..Denies intent or plan  to harm self or others. Routine conducted according to faculty protocol .Encourge patient to notify  staff with any needs or concerns. Patient verbalized agreement and understanding .Will continue to monitor for safety. 

## 2022-02-07 NOTE — ED Notes (Signed)
Patient alert and oriented x 3. Denies SI/HI/AVH. Denies intent or plan to harm self or others. Routine conducted according to faculty protocol. Encourage patient to notify staff with any needs or concerns. Patient verbalized agreement and understanding.  Patient currently watching tv  eating a snack. Will continue to monitor for safety.

## 2022-02-07 NOTE — ED Notes (Signed)
Patient is outside with staff.  

## 2022-02-07 NOTE — ED Provider Notes (Signed)
Behavioral Health Progress Note  Date and Time: 02/07/2022 11:54 AM Name: Teresa Murillo MRN:  [id number]  Subjective:    Teresa Murillo is a 61 year old female with a past psychiatric history significant for schizophrenia, aggressive behavior, and possible intellectual disability who presented to [facility] on 11/23/2021 as a walk-in. At the time, patient had been dismissed from the group home due to multiple elopements. Patient has remained at [facility] while awaiting new placement. Social work and DSS are involved.  Despite endorsing some depression, patient reports that she is doing well.  Patient reports that she has been on her medications regularly and denies any adverse side effects.  Patient reports that she is anxious to be discharged from this facility and is waiting to hear back from social work regarding placement.  Patient denies suicidal or homicidal ideations.  She further denies auditory or visual hallucinations and does not appear to be responding to internal/external stimuli.  Patient denies active auditory or visual hallucinations and does not appear to be responding to internal/external stimuli.  Patient endorses occasional sleep issues.  Patient endorses good appetite.  Patient denies alcohol consumption, tobacco use, and illicit drug use.  Patient is casually dressed, alert and oriented x 4. Patient is calm, cooperative, and engaged in conversation during the encounter. Patient maintains fair eye contact. Patient's speech is clear, coherent, and with normal rate. Patient's thought process is fluent. Patient's thought content is logical. Patient endorses paranoia and delusional thoughts. Patient denies suicidal or homicidal ideations. Patient does not appear to be responding to internal/external stimuli.  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 On the Wechsler Adult Intelligence Scale-4, Teresa Murillo achieved a full-scale IQ score of 73 and a percentile rank of 4 placing her in the borderline range of intellectual functioning.    Chronic obstructive pulmonary disease (COPD) (HCC)    Glaucoma    Hyperlipidemia    Hypertension    Iron deficiency    Schizoaffective disorder (HCC)    Type 2 diabetes mellitus (HCC)     Past Surgical History:  Procedure Laterality Date   TUBAL LIGATION     Family History:  Family History  Problem Relation Age of Onset   Breast cancer Maternal Grandmother    Family Psychiatric  History: See H&P Social History:  Social History   Substance and Sexual Activity  Alcohol Use Yes     Social History   Substance and Sexual Activity  Drug Use Not Currently    Social History   Socioeconomic History   Marital status: Divorced    Spouse name: Not on file   Number of children: Not on file   Years of education: Not on file   Highest education level: Not on file  Occupational History   Not on file  Tobacco Use   Smoking status: Every Day    Packs/day: 1.00    Types: Cigars, Cigarettes   Smokeless tobacco: Current  Vaping Use   Vaping Use: Never used  Substance and Sexual Activity   Alcohol use: Yes   Drug use: Not Currently   Sexual activity: Not Currently  Birth control/protection: Surgical  Other Topics Concern   Not on file  Social History Narrative   Not on file   Social Determinants of Health   Financial Resource Strain: Not on file  Food Insecurity: Not on file  Transportation Needs: Not on file  Physical Activity: Not on file  Stress: Not on file  Social Connections: Not on  file   SDOH:  SDOH Screenings   Depression (PHQ2-9): Low Risk  (11/23/2021)  Tobacco Use: High Risk (02/03/2022)   Additional Social History:  See H&P  Sleep: Fair  Appetite:  Good  Current Medications:  Current Facility-Administered Medications  Medication Dose Route Frequency Provider Last Rate Last Admin   acetaminophen (TYLENOL) tablet 650 mg  650 mg Oral Q6H PRN Revonda Humphrey, NP   650 mg at 02/07/22 0032   albuterol (VENTOLIN HFA) 108 (90 Base) MCG/ACT inhaler 2 puff  2 puff Inhalation Q6H PRN Revonda Humphrey, NP   2 puff at 02/05/22 0642   alum & mag hydroxide-simeth (MAALOX/MYLANTA) 200-200-20 MG/5ML suspension 30 mL  30 mL Oral Q4H PRN Revonda Humphrey, NP   30 mL at 02/05/22 2110   apixaban (ELIQUIS) tablet 5 mg  5 mg Oral BID Revonda Humphrey, NP   5 mg at 02/07/22 0914   ARIPiprazole (ABILIFY) tablet 10 mg  10 mg Oral Daily Revonda Humphrey, NP   10 mg at 02/07/22 0914   cloZAPine (CLOZARIL) tablet 50 mg  50 mg Oral Daily Revonda Humphrey, NP   50 mg at 02/07/22 0914   cloZAPine (CLOZARIL) tablet 75 mg  75 mg Oral QHS Revonda Humphrey, NP   75 mg at 02/06/22 2129   diclofenac Sodium (VOLTAREN) 1 % topical gel 2 g  2 g Topical BID PRN Rankin, Shuvon B, NP   2 g at 02/03/22 0920   diltiazem (CARDIZEM CD) 24 hr capsule 240 mg  240 mg Oral Daily Revonda Humphrey, NP   240 mg at 02/07/22 0913   divalproex (DEPAKOTE ER) 24 hr tablet 500 mg  500 mg Oral BID Revonda Humphrey, NP   500 mg at 02/07/22 0914   fluticasone furoate-vilanterol (BREO ELLIPTA) 200-25 MCG/ACT 1 puff  1 puff Inhalation Daily Revonda Humphrey, NP   1 puff at 02/07/22 0802   haloperidol (HALDOL) tablet 10 mg  10 mg Oral TID PRN Revonda Humphrey, NP       hydrOXYzine (ATARAX) tablet 25 mg  25 mg Oral TID PRN Revonda Humphrey, NP   25 mg at 02/05/22 2109   insulin aspart (novoLOG) injection 0-9 Units  0-9 Units Subcutaneous TID WC Revonda Humphrey, NP   1 Units at 02/07/22 0802    insulin glargine-yfgn (SEMGLEE) injection 12 Units  12 Units Subcutaneous Q1200 Revonda Humphrey, NP   12 Units at 02/06/22 1209   latanoprost (XALATAN) 0.005 % ophthalmic solution 1 drop  1 drop Both Eyes QHS Thomes Lolling H, NP   1 drop at 02/06/22 2131   loperamide (IMODIUM) capsule 2-4 mg  2-4 mg Oral PRN Rankin, Shuvon B, NP   4 mg at 02/03/22 1411   magnesium hydroxide (MILK OF MAGNESIA) suspension 30 mL  30 mL Oral Daily PRN Revonda Humphrey, NP       menthol-cetylpyridinium (CEPACOL) lozenge 3 mg  1 lozenge Oral TID PRN Onuoha, Chinwendu V, NP   3 mg at 02/05/22 2110   metFORMIN (GLUCOPHAGE-XR) 24 hr tablet 1,000 mg  1,000 mg Oral BID WC Revonda Humphrey, NP   1,000 mg at 02/07/22 0802   metroNIDAZOLE (METROGEL) 0.75 % vaginal gel 1 Applicatorful  1 Applicatorful Vaginal QHS Lucky Rathke, FNP   1 Applicatorful at 48/54/62 2246   nicotine (NICODERM CQ - dosed in mg/24 hr) patch 7 mg  7 mg Transdermal Daily Revonda Humphrey, NP   7 mg at 02/07/22 0915   traZODone (DESYREL) tablet 50 mg  50 mg Oral QHS PRN Revonda Humphrey, NP   50 mg at 02/07/22 0032   valbenazine (INGREZZA) capsule 40 mg  40 mg Oral q AM Revonda Humphrey, NP   40 mg at 02/07/22 7035   Vitamin D (Ergocalciferol) (DRISDOL) 1.25 MG (50000 UNIT) capsule 50,000 Units  50,000 Units Oral Q Sharren Bridge, NP   50,000 Units at 02/04/22 0919   Current Outpatient Medications  Medication Sig Dispense Refill   Accu-Chek Softclix Lancets lancets Use as directed up to four times daily 100 each 0   apixaban (ELIQUIS) 5 MG TABS tablet Take 1 tablet (5 mg total) by mouth 2 (two) times daily. 60 tablet 0   ARIPiprazole (ABILIFY) 10 MG tablet Take 1 tablet (10 mg total) by mouth daily. 30 tablet 0   Blood Glucose Monitoring Suppl (ACCU-CHEK GUIDE) w/Device KIT Use as directed up to four times daily 1 kit 0   budesonide-formoterol (SYMBICORT) 160-4.5 MCG/ACT inhaler Inhale 2 puffs into the lungs in the morning and at  bedtime.     Cholecalciferol (VITAMIN D3) 1.25 MG (50000 UT) CAPS Take 50,000 Units by mouth every Thursday.     cloZAPine (CLOZARIL) 25 MG tablet Take 3 tablets (75 mg total) by mouth at bedtime. 90 tablet 0   clozapine (CLOZARIL) 50 MG tablet Take 1 tablet (50 mg total) by mouth daily. 30 tablet 0   diltiazem (CARDIZEM CD) 240 MG 24 hr capsule Take 1 capsule (240 mg total) by mouth daily. (Patient not taking: Reported on 11/24/2021) 30 capsule 0   divalproex (DEPAKOTE ER) 500 MG 24 hr tablet Take 1 tablet (500 mg total) by mouth 2 (two) times daily. 60 tablet 0   glucose blood test strip Use as directed up to four times daily 50 each 0   haloperidol (HALDOL) 10 MG tablet Take 1 tablet (10 mg total) by mouth 3 (three) times daily as needed for agitation (and psychotic symptoms).     INGREZZA 40 MG capsule Take 1 capsule (40 mg total) by mouth in the morning. 30 capsule 0   insulin aspart (NOVOLOG) 100 UNIT/ML FlexPen Before each meal 3 times a day, 140-199 - 2 units, 200-250 - 4 units, 251-299 - 6 units,  300-349 - 8 units,  350 or above 10 units. Insulin PEN if approved, provide syringes and needles if needed.Please switch to any approved short acting Insulin if needed. 15 mL 0   insulin glargine (LANTUS) 100 UNIT/ML Solostar Pen Inject 12 Units into the skin daily. 15 mL 0   Insulin Pen Needle 32G X 4 MM MISC Use 4 times a day with insulin, 1 month supply. 100 each 0   latanoprost (XALATAN) 0.005 % ophthalmic solution Place 1 drop into both eyes at bedtime.     metFORMIN (GLUCOPHAGE) 500 MG tablet Take 500 mg by mouth 2 (two) times daily with a meal.     nicotine (NICODERM CQ - DOSED IN MG/24 HR) 7 mg/24hr patch Place 1 patch (7 mg total) onto the skin  daily. 28 patch 0   PROAIR HFA 108 (90 Base) MCG/ACT inhaler Inhale 2 puffs into the lungs every 6 (six) hours as needed for wheezing or shortness of breath.      Labs  Lab Results:  Admission on 01/24/2022  Component Date Value Ref Range  Status   Glucose-Capillary 01/24/2022 143 (H)  70 - 99 mg/dL Final   Glucose reference range applies only to samples taken after fasting for at least 8 hours.   Glucose-Capillary 01/24/2022 120 (H)  70 - 99 mg/dL Final   Glucose reference range applies only to samples taken after fasting for at least 8 hours.   Glucose-Capillary 01/25/2022 122 (H)  70 - 99 mg/dL Final   Glucose reference range applies only to samples taken after fasting for at least 8 hours.   Glucose-Capillary 01/25/2022 190 (H)  70 - 99 mg/dL Final   Glucose reference range applies only to samples taken after fasting for at least 8 hours.   Glucose-Capillary 01/25/2022 163 (H)  70 - 99 mg/dL Final   Glucose reference range applies only to samples taken after fasting for at least 8 hours.   WBC 01/26/2022 6.5  4.0 - 10.5 K/uL Final   RBC 01/26/2022 4.53  3.87 - 5.11 MIL/uL Final   Hemoglobin 01/26/2022 11.3 (L)  12.0 - 15.0 g/dL Final   HCT 01/26/2022 36.4  36.0 - 46.0 % Final   MCV 01/26/2022 80.4  80.0 - 100.0 fL Final   MCH 01/26/2022 24.9 (L)  26.0 - 34.0 pg Final   MCHC 01/26/2022 31.0  30.0 - 36.0 g/dL Final   RDW 01/26/2022 17.3 (H)  11.5 - 15.5 % Final   Platelets 01/26/2022 327  150 - 400 K/uL Final   nRBC 01/26/2022 0.0  0.0 - 0.2 % Final   Neutrophils Relative % 01/26/2022 60  % Final   Neutro Abs 01/26/2022 3.9  1.7 - 7.7 K/uL Final   Lymphocytes Relative 01/26/2022 31  % Final   Lymphs Abs 01/26/2022 2.0  0.7 - 4.0 K/uL Final   Monocytes Relative 01/26/2022 7  % Final   Monocytes Absolute 01/26/2022 0.5  0.1 - 1.0 K/uL Final   Eosinophils Relative 01/26/2022 1  % Final   Eosinophils Absolute 01/26/2022 0.1  0.0 - 0.5 K/uL Final   Basophils Relative 01/26/2022 1  % Final   Basophils Absolute 01/26/2022 0.1  0.0 - 0.1 K/uL Final   Immature Granulocytes 01/26/2022 0  % Final   Abs Immature Granulocytes 01/26/2022 0.02  0.00 - 0.07 K/uL Final   Performed at [facility], [place] [address],  [place], [place] [address]   Glucose-Capillary 01/26/2022 149 (H)  70 - 99 mg/dL Final   Glucose reference range applies only to samples taken after fasting for at least 8 hours.   Glucose-Capillary 01/26/2022 130 (H)  70 - 99 mg/dL Final   Glucose reference range applies only to samples taken after fasting for at least 8 hours.   Glucose-Capillary 01/26/2022 151 (H)  70 - 99 mg/dL Final   Glucose reference range applies only to samples taken after fasting for at least 8 hours.   Glucose-Capillary 01/26/2022 170 (H)  70 - 99 mg/dL Final   Glucose reference range applies only to samples taken after fasting for at least 8 hours.   Glucose-Capillary 01/27/2022 129 (H)  70 - 99 mg/dL Final   Glucose reference range applies only to samples taken after fasting for at least 8 hours.  Glucose-Capillary 01/27/2022 101 (H)  70 - 99 mg/dL Final   Glucose reference range applies only to samples taken after fasting for at least 8 hours.   Glucose-Capillary 01/27/2022 220 (H)  70 - 99 mg/dL Final   Glucose reference range applies only to samples taken after fasting for at least 8 hours.   Glucose-Capillary 01/27/2022 154 (H)  70 - 99 mg/dL Final   Glucose reference range applies only to samples taken after fasting for at least 8 hours.   Glucose-Capillary 01/27/2022 163 (H)  70 - 99 mg/dL Final   Glucose reference range applies only to samples taken after fasting for at least 8 hours.   Glucose-Capillary 01/28/2022 127 (H)  70 - 99 mg/dL Final   Glucose reference range applies only to samples taken after fasting for at least 8 hours.   Glucose-Capillary 01/28/2022 110 (H)  70 - 99 mg/dL Final   Glucose reference range applies only to samples taken after fasting for at least 8 hours.   Glucose-Capillary 01/28/2022 167 (H)  70 - 99 mg/dL Final   Glucose reference range applies only to samples taken after fasting for at least 8 hours.   Glucose-Capillary 01/29/2022 145 (H)  70 - 99 mg/dL Final   Glucose  reference range applies only to samples taken after fasting for at least 8 hours.   Glucose-Capillary 01/29/2022 203 (H)  70 - 99 mg/dL Final   Glucose reference range applies only to samples taken after fasting for at least 8 hours.   Glucose-Capillary 01/29/2022 153 (H)  70 - 99 mg/dL Final   Glucose reference range applies only to samples taken after fasting for at least 8 hours.   Glucose-Capillary 01/29/2022 166 (H)  70 - 99 mg/dL Final   Glucose reference range applies only to samples taken after fasting for at least 8 hours.   Glucose-Capillary 01/30/2022 142 (H)  70 - 99 mg/dL Final   Glucose reference range applies only to samples taken after fasting for at least 8 hours.   Glucose-Capillary 01/30/2022 138 (H)  70 - 99 mg/dL Final   Glucose reference range applies only to samples taken after fasting for at least 8 hours.   Glucose-Capillary 01/30/2022 185 (H)  70 - 99 mg/dL Final   Glucose reference range applies only to samples taken after fasting for at least 8 hours.   Glucose-Capillary 01/30/2022 220 (H)  70 - 99 mg/dL Final   Glucose reference range applies only to samples taken after fasting for at least 8 hours.   Glucose-Capillary 01/31/2022 147 (H)  70 - 99 mg/dL Final   Glucose reference range applies only to samples taken after fasting for at least 8 hours.   Glucose-Capillary 01/31/2022 131 (H)  70 - 99 mg/dL Final   Glucose reference range applies only to samples taken after fasting for at least 8 hours.   Glucose-Capillary 01/31/2022 169 (H)  70 - 99 mg/dL Final   Glucose reference range applies only to samples taken after fasting for at least 8 hours.   Glucose-Capillary 01/31/2022 144 (H)  70 - 99 mg/dL Final   Glucose reference range applies only to samples taken after fasting for at least 8 hours.   Comment 1 01/31/2022 RN AWARE   Final   Glucose-Capillary 02/01/2022 117 (H)  70 - 99 mg/dL Final   Glucose reference range applies only to samples taken after fasting  for at least 8 hours.   Glucose-Capillary 02/01/2022 180 (H)  70 - 99  mg/dL Final   Glucose reference range applies only to samples taken after fasting for at least 8 hours.   Glucose-Capillary 02/01/2022 209 (H)  70 - 99 mg/dL Final   Glucose reference range applies only to samples taken after fasting for at least 8 hours.   Glucose-Capillary 02/02/2022 131 (H)  70 - 99 mg/dL Final   Glucose reference range applies only to samples taken after fasting for at least 8 hours.   WBC 02/02/2022 7.5  4.0 - 10.5 K/uL Final   RBC 02/02/2022 4.52  3.87 - 5.11 MIL/uL Final   Hemoglobin 02/02/2022 11.3 (L)  12.0 - 15.0 g/dL Final   HCT 02/02/2022 35.0 (L)  36.0 - 46.0 % Final   MCV 02/02/2022 77.4 (L)  80.0 - 100.0 fL Final   MCH 02/02/2022 25.0 (L)  26.0 - 34.0 pg Final   MCHC 02/02/2022 32.3  30.0 - 36.0 g/dL Final   RDW 02/02/2022 16.7 (H)  11.5 - 15.5 % Final   Platelets 02/02/2022 345  150 - 400 K/uL Final   nRBC 02/02/2022 0.0  0.0 - 0.2 % Final   Neutrophils Relative % 02/02/2022 63  % Final   Neutro Abs 02/02/2022 4.7  1.7 - 7.7 K/uL Final   Lymphocytes Relative 02/02/2022 28  % Final   Lymphs Abs 02/02/2022 2.1  0.7 - 4.0 K/uL Final   Monocytes Relative 02/02/2022 7  % Final   Monocytes Absolute 02/02/2022 0.5  0.1 - 1.0 K/uL Final   Eosinophils Relative 02/02/2022 1  % Final   Eosinophils Absolute 02/02/2022 0.1  0.0 - 0.5 K/uL Final   Basophils Relative 02/02/2022 1  % Final   Basophils Absolute 02/02/2022 0.1  0.0 - 0.1 K/uL Final   Immature Granulocytes 02/02/2022 0  % Final   Abs Immature Granulocytes 02/02/2022 0.03  0.00 - 0.07 K/uL Final   Performed at [facility], [place] [address], [place],  [address]   Glucose-Capillary 02/02/2022 95  70 - 99 mg/dL Final   Glucose reference range applies only to samples taken after fasting for at least 8 hours.   Glucose-Capillary 02/02/2022 120 (H)  70 - 99 mg/dL Final   Glucose reference range applies only to samples taken after  fasting for at least 8 hours.   Glucose-Capillary 02/02/2022 191 (H)  70 - 99 mg/dL Final   Glucose reference range applies only to samples taken after fasting for at least 8 hours.   Glucose-Capillary 02/03/2022 148 (H)  70 - 99 mg/dL Final   Glucose reference range applies only to samples taken after fasting for at least 8 hours.   Glucose-Capillary 02/03/2022 122 (H)  70 - 99 mg/dL Final   Glucose reference range applies only to samples taken after fasting for at least 8 hours.   Glucose-Capillary 02/03/2022 233 (H)  70 - 99 mg/dL Final   Glucose reference range applies only to samples taken after fasting for at least 8 hours.   Glucose-Capillary 02/04/2022 126 (H)  70 - 99 mg/dL Final   Glucose reference range applies only to samples taken after fasting for at least 8 hours.   Glucose-Capillary 02/04/2022 117 (H)  70 - 99 mg/dL Final   Glucose reference range applies only to samples taken after fasting for at least 8 hours.   Glucose-Capillary 02/04/2022 135 (H)  70 - 99 mg/dL Final   Glucose reference range applies only to samples taken after fasting for at least 8 hours.   Glucose-Capillary 02/04/2022  197 (H)  70 - 99 mg/dL Final   Glucose reference range applies only to samples taken after fasting for at least 8 hours.   Glucose-Capillary 02/05/2022 170 (H)  70 - 99 mg/dL Final   Glucose reference range applies only to samples taken after fasting for at least 8 hours.   Glucose-Capillary 02/05/2022 107 (H)  70 - 99 mg/dL Final   Glucose reference range applies only to samples taken after fasting for at least 8 hours.   Glucose-Capillary 02/05/2022 184 (H)  70 - 99 mg/dL Final   Glucose reference range applies only to samples taken after fasting for at least 8 hours.   Glucose-Capillary 02/05/2022 256 (H)  70 - 99 mg/dL Final   Glucose reference range applies only to samples taken after fasting for at least 8 hours.   Glucose-Capillary 02/06/2022 144 (H)  70 - 99 mg/dL Final   Glucose  reference range applies only to samples taken after fasting for at least 8 hours.   Glucose-Capillary 02/06/2022 190 (H)  70 - 99 mg/dL Final   Glucose reference range applies only to samples taken after fasting for at least 8 hours.   Glucose-Capillary 02/06/2022 92  70 - 99 mg/dL Final   Glucose reference range applies only to samples taken after fasting for at least 8 hours.   Glucose-Capillary 02/06/2022 197 (H)  70 - 99 mg/dL Final   Glucose reference range applies only to samples taken after fasting for at least 8 hours.   Glucose-Capillary 02/07/2022 129 (H)  70 - 99 mg/dL Final   Glucose reference range applies only to samples taken after fasting for at least 8 hours.  Admission on 01/23/2022, Discharged on 01/24/2022  Component Date Value Ref Range Status   Sodium 01/23/2022 140  135 - 145 mmol/L Final   Potassium 01/23/2022 4.4  3.5 - 5.1 mmol/L Final   Chloride 01/23/2022 101  98 - 111 mmol/L Final   CO2 01/23/2022 25  22 - 32 mmol/L Final   Glucose, Bld 01/23/2022 198 (H)  70 - 99 mg/dL Final   Glucose reference range applies only to samples taken after fasting for at least 8 hours.   BUN 01/23/2022 13  6 - 20 mg/dL Final   Creatinine, Ser 01/23/2022 0.62  0.44 - 1.00 mg/dL Final   Calcium 01/23/2022 9.3  8.9 - 10.3 mg/dL Final   GFR, Estimated 01/23/2022 >60  >60 mL/min Final   Comment: (NOTE) Calculated using the CKD-EPI Creatinine Equation (2021)    Anion gap 01/23/2022 14  5 - 15 Final   Performed at [facility], [place] [address], [place], [place] [address]   WBC 01/23/2022 5.8  4.0 - 10.5 K/uL Final   RBC 01/23/2022 4.63  3.87 - 5.11 MIL/uL Final   Hemoglobin 01/23/2022 11.7 (L)  12.0 - 15.0 g/dL Final   HCT 01/23/2022 37.4  36.0 - 46.0 % Final   MCV 01/23/2022 80.8  80.0 - 100.0 fL Final   MCH 01/23/2022 25.3 (L)  26.0 - 34.0 pg Final   MCHC 01/23/2022 31.3  30.0 - 36.0 g/dL Final   RDW 01/23/2022 17.9 (H)  11.5 - 15.5 % Final   Platelets 01/23/2022 333  150 -  400 K/uL Final   nRBC 01/23/2022 0.0  0.0 - 0.2 % Final   Performed at [place] [address] [place], [place] [address]   Troponin I (High Sensitivity) 01/23/2022 6  <18 ng/L Final   Comment: (  NOTE) Elevated high sensitivity troponin I (hsTnI) values and significant  changes across serial measurements may suggest ACS but many other  chronic and acute conditions are known to elevate hsTnI results.  Refer to the "Links" section for chest pain algorithms and additional  guidance. Performed at [facility], [place] [address], [place], [place] [address]    Troponin I (High Sensitivity) 01/23/2022 5  <18 ng/L Final   Comment: (NOTE) Elevated high sensitivity troponin I (hsTnI) values and significant  changes across serial measurements may suggest ACS but many other  chronic and acute conditions are known to elevate hsTnI results.  Refer to the "Links" section for chest pain algorithms and additional  guidance. Performed at [facility], [place] [address], [place], [place] [address]    SARS Coronavirus 2 by RT PCR 01/23/2022 NEGATIVE  NEGATIVE Final   Comment: (NOTE) SARS-CoV-2 target nucleic acids are NOT DETECTED.  The SARS-CoV-2 RNA is generally detectable in upper and lower respiratory specimens during the acute phase of infection. The lowest concentration of SARS-CoV-2 viral copies this assay can detect is 250 copies / mL. A negative result does not preclude SARS-CoV-2 infection and should not be used as the sole basis for treatment or other patient management decisions.  A negative result may occur with improper specimen collection / handling, submission of specimen other than nasopharyngeal swab, presence of viral mutation(s) within the areas targeted by this assay, and inadequate number of viral copies (<250 copies / mL). A negative result must be combined with clinical observations, patient history, and epidemiological information.  Fact Sheet for  Patients:   https://www.patel.info/  Fact Sheet for Healthcare Providers: https://hall.com/  This test is not yet approved or                           cleared by the [place] FDA and has been authorized for detection and/or diagnosis of SARS-CoV-2 by FDA under an Emergency Use Authorization (EUA).  This EUA will remain in effect (meaning this test can be used) for the duration of the COVID-19 declaration under Section 564(b)(1) of the Act, 21 U.S.C. section 360bbb-3(b)(1), unless the authorization is terminated or revoked sooner.  Performed at [facility], [place] [address] [place], [place] [address]    B Natriuretic Peptide 01/23/2022 41.3  0.0 - 100.0 pg/mL Final   Performed at [place] [address] [place], [place] [address]   Group A Strep by PCR 01/23/2022 NOT DETECTED  NOT DETECTED Final   Performed at [facility], [place] [address] [place], [place] [address]   Color, Urine 01/23/2022 YELLOW  YELLOW Final   APPearance 01/23/2022 CLEAR  CLEAR Final   Specific Gravity, Urine 01/23/2022 1.018  1.005 - 1.030 Final   pH 01/23/2022 7.0  5.0 - 8.0 Final   Glucose, UA 01/23/2022 NEGATIVE  NEGATIVE mg/dL Final   Hgb urine dipstick 01/23/2022 NEGATIVE  NEGATIVE Final   Bilirubin Urine 01/23/2022 NEGATIVE  NEGATIVE Final   Ketones, ur 01/23/2022 NEGATIVE  NEGATIVE mg/dL Final   Protein, ur 01/23/2022 NEGATIVE  NEGATIVE mg/dL Final   Nitrite 01/23/2022 NEGATIVE  NEGATIVE Final   Leukocytes,Ua 01/23/2022 TRACE (A)  NEGATIVE Final   RBC / HPF 01/23/2022 0-5  0 - 5 RBC/hpf Final   WBC, UA 01/23/2022 0-5  0 - 5 WBC/hpf Final   Bacteria, UA 01/23/2022 NONE SEEN  NONE SEEN Final   Squamous  Epithelial / LPF 01/23/2022 0-5  0 - 5 Final   Mucus 01/23/2022 PRESENT   Final   Performed at [facility], [place] [address] [place], [place] [address]   SARS Coronavirus 2 by RT PCR 01/23/2022 NEGATIVE  NEGATIVE Final    Comment: (NOTE) SARS-CoV-2 target nucleic acids are NOT DETECTED.  The SARS-CoV-2 RNA is generally detectable in upper respiratory specimens during the acute phase of infection. The lowest concentration of SARS-CoV-2 viral copies this assay can detect is 138 copies/mL. A negative result does not preclude SARS-Cov-2 infection and should not be used as the sole basis for treatment or other patient management decisions. A negative result may occur with  improper specimen collection/handling, submission of specimen other than nasopharyngeal swab, presence of viral mutation(s) within the areas targeted by this assay, and inadequate number of viral copies(<138 copies/mL). A negative result must be combined with clinical observations, patient history, and epidemiological information. The expected result is Negative.  Fact Sheet for Patients:  EntrepreneurPulse.com.au  Fact Sheet for Healthcare Providers:  IncredibleEmployment.be  This test is no                          t yet approved or cleared by the [place] FDA and  has been authorized for detection and/or diagnosis of SARS-CoV-2 by FDA under an Emergency Use Authorization (EUA). This EUA will remain  in effect (meaning this test can be used) for the duration of the COVID-19 declaration under Section 564(b)(1) of the Act, 21 U.S.C.section 360bbb-3(b)(1), unless the authorization is terminated  or revoked sooner.       Influenza A by PCR 01/23/2022 NEGATIVE  NEGATIVE Final   Influenza B by PCR 01/23/2022 NEGATIVE  NEGATIVE Final   Comment: (NOTE) The Xpert Xpress SARS-CoV-2/FLU/RSV plus assay is intended as an aid in the diagnosis of influenza from Nasopharyngeal swab specimens and should not be used as a sole basis for treatment. Nasal washings and aspirates are unacceptable for Xpert Xpress SARS-CoV-2/FLU/RSV testing.  Fact Sheet for  Patients: EntrepreneurPulse.com.au  Fact Sheet for Healthcare Providers: IncredibleEmployment.be  This test is not yet approved or cleared by the [place] FDA and has been authorized for detection and/or diagnosis of SARS-CoV-2 by FDA under an Emergency Use Authorization (EUA). This EUA will remain in effect (meaning this test can be used) for the duration of the COVID-19 declaration under Section 564(b)(1) of the Act, 21 U.S.C. section 360bbb-3(b)(1), unless the authorization is terminated or revoked.  Performed at [facility], [place] [address] [place], [place] [address]    WBC 01/24/2022 6.1  4.0 - 10.5 K/uL Final   RBC 01/24/2022 4.60  3.87 - 5.11 MIL/uL Final   Hemoglobin 01/24/2022 11.7 (L)  12.0 - 15.0 g/dL Final   HCT 01/24/2022 37.0  36.0 - 46.0 % Final   MCV 01/24/2022 80.4  80.0 - 100.0 fL Final   MCH 01/24/2022 25.4 (L)  26.0 - 34.0 pg Final   MCHC 01/24/2022 31.6  30.0 - 36.0 g/dL Final   RDW 01/24/2022 17.6 (H)  11.5 - 15.5 % Final   Platelets 01/24/2022 334  150 - 400 K/uL Final   nRBC 01/24/2022 0.0  0.0 - 0.2 % Final   Neutrophils Relative % 01/24/2022 53  % Final   Neutro Abs 01/24/2022 3.3  1.7 - 7.7 K/uL Final   Lymphocytes Relative 01/24/2022 33  % Final   Lymphs Abs 01/24/2022 2.0  0.7 -  4.0 K/uL Final   Monocytes Relative 01/24/2022 11  % Final   Monocytes Absolute 01/24/2022 0.7  0.1 - 1.0 K/uL Final   Eosinophils Relative 01/24/2022 2  % Final   Eosinophils Absolute 01/24/2022 0.1  0.0 - 0.5 K/uL Final   Basophils Relative 01/24/2022 1  % Final   Basophils Absolute 01/24/2022 0.1  0.0 - 0.1 K/uL Final   Immature Granulocytes 01/24/2022 0  % Final   Abs Immature Granulocytes 01/24/2022 0.02  0.00 - 0.07 K/uL Final   Performed at [facility], [place] [address], [place], [place] [address]   Glucose-Capillary 01/24/2022 110 (H)  70 - 99 mg/dL Final   Glucose reference range applies only to samples taken  after fasting for at least 8 hours.  No results displayed because visit has over 200 results.    Admission on 11/22/2021, Discharged on 11/22/2021  Component Date Value Ref Range Status   Glucose-Capillary 11/22/2021 169 (H)  70 - 99 mg/dL Final   Glucose reference range applies only to samples taken after fasting for at least 8 hours.  No results displayed because visit has over 200 results.    Admission on 10/04/2021, Discharged on 10/22/2021  Component Date Value Ref Range Status   SARS Coronavirus 2 by RT PCR 10/04/2021 NEGATIVE  NEGATIVE Final   Comment: (NOTE) SARS-CoV-2 target nucleic acids are NOT DETECTED.  The SARS-CoV-2 RNA is generally detectable in upper respiratory specimens during the acute phase of infection. The lowest concentration of SARS-CoV-2 viral copies this assay can detect is 138 copies/mL. A negative result does not preclude SARS-Cov-2 infection and should not be used as the sole basis for treatment or other patient management decisions. A negative result may occur with  improper specimen collection/handling, submission of specimen other than nasopharyngeal swab, presence of viral mutation(s) within the areas targeted by this assay, and inadequate number of viral copies(<138 copies/mL). A negative result must be combined with clinical observations, patient history, and epidemiological information. The expected result is Negative.  Fact Sheet for Patients:  EntrepreneurPulse.com.au  Fact Sheet for Healthcare Providers:  IncredibleEmployment.be  This test is no                          t yet approved or cleared by the [place] FDA and  has been authorized for detection and/or diagnosis of SARS-CoV-2 by FDA under an Emergency Use Authorization (EUA). This EUA will remain  in effect (meaning this test can be used) for the duration of the COVID-19 declaration under Section 564(b)(1) of the Act, 21 U.S.C.section  360bbb-3(b)(1), unless the authorization is terminated  or revoked sooner.       Influenza A by PCR 10/04/2021 NEGATIVE  NEGATIVE Final   Influenza B by PCR 10/04/2021 NEGATIVE  NEGATIVE Final   Comment: (NOTE) The Xpert Xpress SARS-CoV-2/FLU/RSV plus assay is intended as an aid in the diagnosis of influenza from Nasopharyngeal swab specimens and should not be used as a sole basis for treatment. Nasal washings and aspirates are unacceptable for Xpert Xpress SARS-CoV-2/FLU/RSV testing.  Fact Sheet for Patients: EntrepreneurPulse.com.au  Fact Sheet for Healthcare Providers: IncredibleEmployment.be  This test is not yet approved or cleared by the [place] FDA and has been authorized for detection and/or diagnosis of SARS-CoV-2 by FDA under an Emergency Use Authorization (EUA). This EUA will remain in effect (meaning this test can be used) for the duration of the COVID-19 declaration under Section 564(b)(1) of  the Act, 21 U.S.C. section 360bbb-3(b)(1), unless the authorization is terminated or revoked.  Performed at [facility], [place] [address], [place], [place] [address]    WBC 10/04/2021 8.4  4.0 - 10.5 K/uL Final   RBC 10/04/2021 4.42  3.87 - 5.11 MIL/uL Final   Hemoglobin 10/04/2021 11.6 (L)  12.0 - 15.0 g/dL Final   HCT 10/04/2021 35.7 (L)  36.0 - 46.0 % Final   MCV 10/04/2021 80.8  80.0 - 100.0 fL Final   MCH 10/04/2021 26.2  26.0 - 34.0 pg Final   MCHC 10/04/2021 32.5  30.0 - 36.0 g/dL Final   RDW 10/04/2021 16.0 (H)  11.5 - 15.5 % Final   Platelets 10/04/2021 372  150 - 400 K/uL Final   nRBC 10/04/2021 0.0  0.0 - 0.2 % Final   Neutrophils Relative % 10/04/2021 68  % Final   Neutro Abs 10/04/2021 5.7  1.7 - 7.7 K/uL Final   Lymphocytes Relative 10/04/2021 22  % Final   Lymphs Abs 10/04/2021 1.8  0.7 - 4.0 K/uL Final   Monocytes Relative 10/04/2021 8  % Final   Monocytes Absolute 10/04/2021 0.7  0.1 - 1.0 K/uL Final    Eosinophils Relative 10/04/2021 1  % Final   Eosinophils Absolute 10/04/2021 0.1  0.0 - 0.5 K/uL Final   Basophils Relative 10/04/2021 1  % Final   Basophils Absolute 10/04/2021 0.1  0.0 - 0.1 K/uL Final   Immature Granulocytes 10/04/2021 0  % Final   Abs Immature Granulocytes 10/04/2021 0.03  0.00 - 0.07 K/uL Final   Performed at [facility], [place] [address] [place], [place] [address]   Sodium 10/04/2021 136  135 - 145 mmol/L Final   Potassium 10/04/2021 4.2  3.5 - 5.1 mmol/L Final   Chloride 10/04/2021 104  98 - 111 mmol/L Final   CO2 10/04/2021 25  22 - 32 mmol/L Final   Glucose, Bld 10/04/2021 100 (H)  70 - 99 mg/dL Final   Glucose reference range applies only to samples taken after fasting for at least 8 hours.   BUN 10/04/2021 9  6 - 20 mg/dL Final   Creatinine, Ser 10/04/2021 0.52  0.44 - 1.00 mg/dL Final   Calcium 10/04/2021 9.0  8.9 - 10.3 mg/dL Final   Total Protein 10/04/2021 7.0  6.5 - 8.1 g/dL Final   Albumin 10/04/2021 3.2 (L)  3.5 - 5.0 g/dL Final   AST 10/04/2021 13 (L)  15 - 41 U/L Final   ALT 10/04/2021 10  0 - 44 U/L Final   Alkaline Phosphatase 10/04/2021 61  38 - 126 U/L Final   Total Bilirubin 10/04/2021 0.3  0.3 - 1.2 mg/dL Final   GFR, Estimated 10/04/2021 >60  >60 mL/min Final   Comment: (NOTE) Calculated using the CKD-EPI Creatinine Equation (2021)    Anion gap 10/04/2021 7  5 - 15 Final   Performed at [facility] [address] [place], [place] [address]   Hgb A1c MFr Bld 10/04/2021 7.0 (H)  4.8 - 5.6 % Final   Comment: (NOTE) Pre diabetes:          5.7%-6.4%  Diabetes:              >6.4%  Glycemic control for   <7.0% adults with diabetes    Mean Plasma Glucose 10/04/2021 154.2  mg/dL Final   Performed at [facility], [place] [address] [place], [place] [address]   Cholesterol 10/04/2021 178  0 - 200 mg/dL Final   Triglycerides 10/04/2021 155 (H)  <150 mg/dL Final   HDL 10/04/2021 52  >40 mg/dL Final   Total CHOL/HDL Ratio  10/04/2021 3.4  RATIO Final   VLDL 10/04/2021 31  0 - 40 mg/dL Final   LDL Cholesterol 10/04/2021 95  0 - 99 mg/dL Final   Comment:        Total Cholesterol/HDL:CHD Risk Coronary Heart Disease Risk Table                     Men   Women  1/2 Average Risk   3.4   3.3  Average Risk       5.0   4.4  2 X Average Risk   9.6   7.1  3 X Average Risk  23.4   11.0        Use the calculated Patient Ratio above and the CHD Risk Table to determine the patient's CHD Risk.        ATP III CLASSIFICATION (LDL):  <100     mg/dL   Optimal  100-129  mg/dL   Near or Above                    Optimal  130-159  mg/dL   Borderline  160-189  mg/dL   High  >190     mg/dL   Very High Performed at [facility] [address], [place], [place] [address]    POC Amphetamine UR 10/04/2021 None Detected  NONE DETECTED (Cut Off Level 1000 ng/mL) Final   POC Secobarbital (BAR) 10/04/2021 None Detected  NONE DETECTED (Cut Off Level 300 ng/mL) Final   POC Buprenorphine (BUP) 10/04/2021 None Detected  NONE DETECTED (Cut Off Level 10 ng/mL) Final   POC Oxazepam (BZO) 10/04/2021 None Detected  NONE DETECTED (Cut Off Level 300 ng/mL) Final   POC Cocaine UR 10/04/2021 None Detected  NONE DETECTED (Cut Off Level 300 ng/mL) Final   POC Methamphetamine UR 10/04/2021 None Detected  NONE DETECTED (Cut Off Level 1000 ng/mL) Final   POC Morphine 10/04/2021 None Detected  NONE DETECTED (Cut Off Level 300 ng/mL) Final   POC Methadone UR 10/04/2021 None Detected  NONE DETECTED (Cut Off Level 300 ng/mL) Final   POC Oxycodone UR 10/04/2021 Positive (A)  NONE DETECTED (Cut Off Level 100 ng/mL) Final   POC Marijuana UR 10/04/2021 None Detected  NONE DETECTED (Cut Off Level 50 ng/mL) Final   SARSCOV2ONAVIRUS 2 AG 10/04/2021 NEGATIVE  NEGATIVE Final   Comment: (NOTE) SARS-CoV-2 antigen NOT DETECTED.   Negative results are presumptive.  Negative results do not preclude SARS-CoV-2 infection and should not be used as the sole  basis for treatment or other patient management decisions, including infection  control decisions, particularly in the presence of clinical signs and  symptoms consistent with COVID-19, or in those who have been in contact with the virus.  Negative results must be combined with clinical observations, patient history, and epidemiological information. The expected result is Negative.  Fact Sheet for Patients: HandmadeRecipes.com.cy  Fact Sheet for Healthcare Providers: FuneralLife.at  This test is not yet approved or cleared by the [place] FDA and  has been authorized for detection and/or diagnosis of SARS-CoV-2 by FDA under an Emergency Use Authorization (EUA).  This EUA will remain in effect (meaning this test can be used) for the duration of  the COV  ID-19 declaration under Section 564(b)(1) of the Act, 21 U.S.C. section 360bbb-3(b)(1), unless the authorization is terminated or revoked sooner.     Valproic Acid Lvl 10/04/2021 51  50.0 - 100.0 ug/mL Final   Performed at [facility] [address] [place], [place] [address]   Valproic Acid Lvl 10/08/2021 57  50.0 - 100.0 ug/mL Final   Performed at [facility] [address] [place], [place] [address]   Glucose-Capillary 10/09/2021 123 (H)  70 - 99 mg/dL Final   Glucose reference range applies only to samples taken after fasting for at least 8 hours.  Admission on 09/09/2021, Discharged on 09/10/2021  Component Date Value Ref Range Status   Sodium 09/09/2021 134 (L)  135 - 145 mmol/L Final   Potassium 09/09/2021 4.3  3.5 - 5.1 mmol/L Final   Chloride 09/09/2021 99  98 - 111 mmol/L Final   CO2 09/09/2021 25  22 - 32 mmol/L Final   Glucose, Bld 09/09/2021 107 (H)  70 - 99 mg/dL Final   Glucose reference range applies only to samples taken after fasting for at least 8 hours.   BUN 09/09/2021 12  6 - 20 mg/dL Final   Creatinine, Ser  09/09/2021 0.74  0.44 - 1.00 mg/dL Final   Calcium 09/09/2021 9.0  8.9 - 10.3 mg/dL Final   Total Protein 09/09/2021 7.4  6.5 - 8.1 g/dL Final   Albumin 09/09/2021 3.1 (L)  3.5 - 5.0 g/dL Final   AST 09/09/2021 13 (L)  15 - 41 U/L Final   ALT 09/09/2021 13  0 - 44 U/L Final   Alkaline Phosphatase 09/09/2021 66  38 - 126 U/L Final   Total Bilirubin 09/09/2021 0.2 (L)  0.3 - 1.2 mg/dL Final   GFR, Estimated 09/09/2021 >60  >60 mL/min Final   Comment: (NOTE) Calculated using the CKD-EPI Creatinine Equation (2021)    Anion gap 09/09/2021 10  5 - 15 Final   Performed at [facility], [place] [address] [place], [place] [address]   WBC 09/09/2021 8.5  4.0 - 10.5 K/uL Final   RBC 09/09/2021 4.53  3.87 - 5.11 MIL/uL Final   Hemoglobin 09/09/2021 11.8 (L)  12.0 - 15.0 g/dL Final   HCT 09/09/2021 38.0  36.0 - 46.0 % Final   MCV 09/09/2021 83.9  80.0 - 100.0 fL Final   MCH 09/09/2021 26.0  26.0 - 34.0 pg Final   MCHC 09/09/2021 31.1  30.0 - 36.0 g/dL Final   RDW 09/09/2021 17.8 (H)  11.5 - 15.5 % Final   Platelets 09/09/2021 442 (H)  150 - 400 K/uL Final   nRBC 09/09/2021 0.0  0.0 - 0.2 % Final   Neutrophils Relative % 09/09/2021 70  % Final   Neutro Abs 09/09/2021 5.9  1.7 - 7.7 K/uL Final   Lymphocytes Relative 09/09/2021 20  % Final   Lymphs Abs 09/09/2021 1.7  0.7 - 4.0 K/uL Final   Monocytes Relative 09/09/2021 8  % Final   Monocytes Absolute 09/09/2021 0.7  0.1 - 1.0 K/uL Final   Eosinophils Relative 09/09/2021 1  % Final   Eosinophils Absolute 09/09/2021 0.1  0.0 - 0.5 K/uL Final   Basophils Relative 09/09/2021 1  % Final   Basophils Absolute 09/09/2021 0.1  0.0 - 0.1 K/uL Final   Immature Granulocytes 09/09/2021 0  % Final   Abs Immature Granulocytes 09/09/2021 0.03  0.00 - 0.07 K/uL Final   Performed at [facility],  [address], [place], [place] [address]   Ammonia 09/09/2021 37 (H)  9 - 35 umol/L Final   Comment: HEMOLYSIS AT THIS LEVEL MAY AFFECT RESULT Performed  at [facility], [place] [address], [place], [place] [address]    Opiates 09/09/2021 NONE DETECTED  NONE DETECTED Final   Cocaine 09/09/2021 NONE DETECTED  NONE DETECTED Final   Benzodiazepines 09/09/2021 NONE DETECTED  NONE DETECTED Final   Amphetamines 09/09/2021 NONE DETECTED  NONE DETECTED Final   Tetrahydrocannabinol 09/09/2021 NONE DETECTED  NONE DETECTED Final   Barbiturates 09/09/2021 NONE DETECTED  NONE DETECTED Final   Comment: (NOTE) DRUG SCREEN FOR MEDICAL PURPOSES ONLY.  IF CONFIRMATION IS NEEDED FOR ANY PURPOSE, NOTIFY LAB WITHIN 5 DAYS.  LOWEST DETECTABLE LIMITS FOR URINE DRUG SCREEN Drug Class                     Cutoff (ng/mL) Amphetamine and metabolites    1000 Barbiturate and metabolites    200 Benzodiazepine                 701 Tricyclics and metabolites     300 Opiates and metabolites        300 Cocaine and metabolites        300 THC                            50 Performed at [facility], [place] [address], [place], [place] [address]    Alcohol, Ethyl (B) 09/09/2021 <10  <10 mg/dL Final   Comment: (NOTE) Lowest detectable limit for serum alcohol is 10 mg/dL.  For medical purposes only. Performed at [facility], [place] [address] [place], [place] [address]    Lipase 09/09/2021 33  11 - 51 U/L Final   Performed at [facility] [address], [place], [place] [address]   Color, Urine 09/09/2021 COLORLESS (A)  YELLOW Final   APPearance 09/09/2021 CLEAR  CLEAR Final   Specific Gravity, Urine 09/09/2021 1.002 (L)  1.005 - 1.030 Final   pH 09/09/2021 6.0  5.0 - 8.0 Final   Glucose, UA 09/09/2021 NEGATIVE  NEGATIVE mg/dL Final   Hgb urine dipstick 09/09/2021 NEGATIVE  NEGATIVE Final   Bilirubin Urine 09/09/2021 NEGATIVE  NEGATIVE Final   Ketones, ur 09/09/2021 NEGATIVE  NEGATIVE mg/dL Final   Protein, ur 09/09/2021 NEGATIVE  NEGATIVE mg/dL Final   Nitrite 09/09/2021 NEGATIVE  NEGATIVE Final   Leukocytes,Ua 09/09/2021 NEGATIVE  NEGATIVE  Final   Performed at [facility] [address], [place], [place] [address]   Specimen Description 09/09/2021 URINE, CLEAN CATCH   Final   Special Requests 09/09/2021 NONE   Final   Culture 09/09/2021  (A)   Final                   Value:<10,000 COLONIES/mL INSIGNIFICANT GROWTH Performed at [facility], [place] [address], [place], [place] [address]    Report Status 09/09/2021 09/10/2021 FINAL   Final   D-Dimer, Quant 09/09/2021 0.92 (H)  0.00 - 0.50 ug/mL-FEU Final   Comment: (NOTE) At the manufacturer cut-off value of 0.5 g/mL FEU, this assay has a negative predictive value of 95-100%.This assay is intended for use in conjunction with a clinical pretest probability (PTP) assessment model to exclude pulmonary embolism (PE) and deep venous thrombosis (DVT) in outpatients suspected of PE or DVT. Results should be  correlated with clinical presentation. Performed at [facility], [place] [address], [place], [place] [address]    Troponin I (High Sensitivity) 09/09/2021 5  <18 ng/L Final   Comment: (NOTE) Elevated high sensitivity troponin I (hsTnI) values and significant  changes across serial measurements may suggest ACS but many other  chronic and acute conditions are known to elevate hsTnI results.  Refer to the "Links" section for chest pain algorithms and additional  guidance. Performed at [facility], [place] [address] [place], [place] [address]    BP 09/10/2021 135/83  mmHg Final   S' Lateral 09/10/2021 2.30  cm Final   Area-P 1/2 09/10/2021 5.58  cm2 Final    Blood Alcohol level:  Lab Results  Component Value Date   ETH <10 11/23/2021   ETH <10 46/27/0350    Metabolic Disorder Labs: Lab Results  Component Value Date   HGBA1C 6.7 (H) 12/28/2021   MPG 145.59 12/28/2021   MPG 154.2 10/04/2021   Lab Results  Component Value Date   PROLACTIN 3.8 (L) 11/23/2021   Lab Results  Component Value Date   CHOL 171 11/23/2021   TRIG 125 11/23/2021    HDL 65 11/23/2021   CHOLHDL 2.6 11/23/2021   VLDL 25 11/23/2021   LDLCALC 81 11/23/2021   LDLCALC 95 10/04/2021    Therapeutic Lab Levels: No results found for: "LITHIUM" Lab Results  Component Value Date   VALPROATE 33 (L) 01/18/2022   VALPROATE 33 (L) 12/28/2021   No results found for: "CBMZ"  Physical Findings   PHQ2-9    Flowsheet Row [facility]  PHQ-2 Total Score 2  PHQ-9 Total Score 3      Flowsheet Row ED from 01/24/2022 in [facility] ED from 01/23/2022 in [facility] ED from 11/23/2021 in [facility] No Risk No Risk No Risk        Musculoskeletal  Strength & Muscle Tone: within normal limits Gait & Station: normal Patient leans: N/A  Psychiatric Specialty Exam  Presentation  General Appearance:  Appropriate for Environment; Casual  Eye Contact: Good  Speech: Clear and Coherent; Normal Rate  Speech Volume: Normal  Handedness: Right   Mood and Affect  Mood: Depressed  Affect: Appropriate; Congruent   Thought Process  Thought Processes: Coherent; Goal Directed; Linear  Descriptions of Associations:Intact  Orientation:Full (Time, Place and Person)  Thought Content:Logical; WDL  Diagnosis of Schizophrenia or Schizoaffective disorder in past: Yes  Duration of Psychotic Symptoms: No data recorded  Hallucinations:Hallucinations: None  Ideas of Reference:None  Suicidal Thoughts:Suicidal Thoughts: No  Homicidal Thoughts:Homicidal Thoughts: No   Sensorium  Memory: Immediate Good; Recent Good; Remote Good  Judgment: Intact  Insight: Present   Executive Functions  Concentration: Fair  Attention Span: Good  Recall: Good  Fund of Knowledge: Good  Language: Good   Psychomotor Activity  Psychomotor Activity: Psychomotor Activity: Normal   Assets   Assets: Communication Skills; Desire for Improvement   Sleep  Sleep: Sleep: Fair   Nutritional Assessment (For OBS and FBC admissions only) Has the patient had a weight loss or gain of 10 pounds or more in the last 3 months?: No Has the patient had a decrease in food intake/or appetite?: No Does the patient have dental problems?: No Does the patient have eating habits or behaviors that may be indicators of an eating disorder including binging or  inducing vomiting?: No Has the patient recently lost weight without trying?: 0 Has the patient been eating poorly because of a decreased appetite?: 0 Malnutrition Screening Tool Score: 0    Physical Exam  Physical Exam Constitutional:      Appearance: Normal appearance.  HENT:     Head: Normocephalic and atraumatic.     Nose: Nose normal.     Mouth/Throat:     Mouth: Mucous membranes are moist.  Eyes:     Extraocular Movements: Extraocular movements intact.     Pupils: Pupils are equal, round, and reactive to light.  Cardiovascular:     Rate and Rhythm: Normal rate and regular rhythm.  Pulmonary:     Effort: Pulmonary effort is normal.     Breath sounds: Normal breath sounds.  Abdominal:     General: Abdomen is flat.  Musculoskeletal:        General: Normal range of motion.     Cervical back: Normal range of motion and neck supple.  Skin:    General: Skin is warm and dry.  Neurological:     General: No focal deficit present.     Mental Status: She is alert and oriented to person, place, and time.  Psychiatric:        Attention and Perception: Attention and perception normal. She does not perceive auditory or visual hallucinations.        Mood and Affect: Affect normal. Mood is depressed.        Speech: Speech normal.        Behavior: Behavior normal. Behavior is cooperative.        Thought Content: Thought content is paranoid. Thought content is not delusional. Thought content does not include homicidal or suicidal  ideation.        Cognition and Memory: Cognition and memory normal.        Judgment: Judgment normal.    Review of Systems  Constitutional: Negative.   HENT: Negative.    Eyes: Negative.   Respiratory: Negative.    Cardiovascular: Negative.   Gastrointestinal: Negative.   Skin: Negative.   Neurological: Negative.   Psychiatric/Behavioral:  Positive for depression. Negative for hallucinations, substance abuse and suicidal ideas. The patient is not nervous/anxious and does not have insomnia.    Blood pressure 125/73, pulse 89, temperature 98.2 F (36.8 C), resp. rate 18, SpO2 97 %. There is no height or weight on file to calculate BMI.  Treatment Plan Summary: Daily contact with patient to assess and evaluate symptoms and progress in treatment and Medication management  Malachy Mood, PA 02/07/2022 11:54 AM

## 2022-02-07 NOTE — ED Notes (Signed)
Patient alert and oriented x 3. Denies SI/HI/AVH. Denies intent or plan to harm self or others. Routine conducted according to faculty protocol. Encourage patient to notify staff with any needs or concerns. Patient verbalized agreement and understanding. Will continue to monitor for safety. 

## 2022-02-07 NOTE — ED Notes (Signed)
Patient resting quietly in bed with eyes closed. Respirations equal and unlabored, skin warm and dry, NAD. Routine safety checks conducted according to facility protocol. Will continue to monitor for safety.  

## 2022-02-07 NOTE — ED Notes (Signed)
Patient  sleeping in no acute stress. RR even and unlabored .Environment secured .Will continue to monitor for safely. 

## 2022-02-07 NOTE — ED Notes (Signed)
Patient A&Ox4. Denies intent to harm self/others when asked. Denies A/VH. Patient reported lower back pain and Tylenol was given @ 2014 and was effective. No acute distress noted. Support and encouragement provided. Routine safety checks conducted according to facility protocol. Encouraged patient to notify staff if thoughts of harm toward self or others arise. Patient verbalize understanding and agreement. Will continue to monitor for safety.

## 2022-02-07 NOTE — ED Notes (Signed)
Patient alert and oriented x 3. Denies SI/HI/AVH. Denies intent or plan to harm self or others. Routine conducted according to faculty protocol. Encourage patient to notify staff with any needs or concerns. Patient verbalized agreement and understanding.  Patient is currently eating  a snack.Will continue to monitor for safety.

## 2022-02-08 ENCOUNTER — Other Ambulatory Visit: Payer: Self-pay

## 2022-02-08 ENCOUNTER — Emergency Department (HOSPITAL_COMMUNITY): Payer: Medicaid Other

## 2022-02-08 ENCOUNTER — Encounter (HOSPITAL_COMMUNITY): Payer: Self-pay | Admitting: Registered Nurse

## 2022-02-08 ENCOUNTER — Emergency Department (HOSPITAL_COMMUNITY)
Admission: EM | Admit: 2022-02-08 | Discharge: 2022-02-09 | Payer: Medicaid Other | Attending: Internal Medicine | Admitting: Internal Medicine

## 2022-02-08 ENCOUNTER — Encounter (HOSPITAL_COMMUNITY): Payer: Self-pay

## 2022-02-08 DIAGNOSIS — R0789 Other chest pain: Secondary | ICD-10-CM | POA: Diagnosis present

## 2022-02-08 DIAGNOSIS — R000 Tachycardia, unspecified: Secondary | ICD-10-CM | POA: Diagnosis not present

## 2022-02-08 DIAGNOSIS — Z7901 Long term (current) use of anticoagulants: Secondary | ICD-10-CM | POA: Insufficient documentation

## 2022-02-08 DIAGNOSIS — Z87891 Personal history of nicotine dependence: Secondary | ICD-10-CM | POA: Insufficient documentation

## 2022-02-08 DIAGNOSIS — D509 Iron deficiency anemia, unspecified: Secondary | ICD-10-CM | POA: Diagnosis not present

## 2022-02-08 DIAGNOSIS — J449 Chronic obstructive pulmonary disease, unspecified: Secondary | ICD-10-CM | POA: Insufficient documentation

## 2022-02-08 DIAGNOSIS — I3139 Other pericardial effusion (noninflammatory): Secondary | ICD-10-CM | POA: Diagnosis not present

## 2022-02-08 DIAGNOSIS — F259 Schizoaffective disorder, unspecified: Secondary | ICD-10-CM | POA: Diagnosis not present

## 2022-02-08 DIAGNOSIS — E119 Type 2 diabetes mellitus without complications: Secondary | ICD-10-CM | POA: Diagnosis not present

## 2022-02-08 DIAGNOSIS — Z794 Long term (current) use of insulin: Secondary | ICD-10-CM | POA: Diagnosis not present

## 2022-02-08 DIAGNOSIS — E785 Hyperlipidemia, unspecified: Secondary | ICD-10-CM | POA: Insufficient documentation

## 2022-02-08 DIAGNOSIS — I480 Paroxysmal atrial fibrillation: Secondary | ICD-10-CM | POA: Insufficient documentation

## 2022-02-08 DIAGNOSIS — Z7984 Long term (current) use of oral hypoglycemic drugs: Secondary | ICD-10-CM | POA: Insufficient documentation

## 2022-02-08 DIAGNOSIS — Z79899 Other long term (current) drug therapy: Secondary | ICD-10-CM | POA: Insufficient documentation

## 2022-02-08 DIAGNOSIS — R0602 Shortness of breath: Secondary | ICD-10-CM | POA: Diagnosis present

## 2022-02-08 DIAGNOSIS — I10 Essential (primary) hypertension: Secondary | ICD-10-CM | POA: Diagnosis not present

## 2022-02-08 DIAGNOSIS — F209 Schizophrenia, unspecified: Secondary | ICD-10-CM | POA: Diagnosis not present

## 2022-02-08 LAB — TROPONIN I (HIGH SENSITIVITY)
Troponin I (High Sensitivity): 4 ng/L (ref <18)
Troponin I (High Sensitivity): 5 ng/L (ref <18)

## 2022-02-08 LAB — CBC
HCT: 33.1 % — ABNORMAL LOW (ref 36.0–46.0)
Hemoglobin: 10.7 g/dL — ABNORMAL LOW (ref 12.0–15.0)
MCH: 24.9 pg — ABNORMAL LOW (ref 26.0–34.0)
MCHC: 32.3 g/dL (ref 30.0–36.0)
MCV: 77 fL — ABNORMAL LOW (ref 80.0–100.0)
Platelets: 361 10*3/uL (ref 150–400)
RBC: 4.3 MIL/uL (ref 3.87–5.11)
RDW: 16.5 % — ABNORMAL HIGH (ref 11.5–15.5)
WBC: 8.5 10*3/uL (ref 4.0–10.5)
nRBC: 0 % (ref 0.0–0.2)

## 2022-02-08 LAB — COMPREHENSIVE METABOLIC PANEL
ALT: 20 U/L (ref 0–44)
AST: 22 U/L (ref 15–41)
Albumin: 3.6 g/dL (ref 3.5–5.0)
Alkaline Phosphatase: 67 U/L (ref 38–126)
Anion gap: 10 (ref 5–15)
BUN: 11 mg/dL (ref 6–20)
CO2: 25 mmol/L (ref 22–32)
Calcium: 9.4 mg/dL (ref 8.9–10.3)
Chloride: 96 mmol/L — ABNORMAL LOW (ref 98–111)
Creatinine, Ser: 0.52 mg/dL (ref 0.44–1.00)
GFR, Estimated: >60 mL/min (ref >60)
Glucose, Bld: 126 mg/dL — ABNORMAL HIGH (ref 70–99)
Potassium: 4.4 mmol/L (ref 3.5–5.1)
Sodium: 131 mmol/L — ABNORMAL LOW (ref 135–145)
Total Bilirubin: 0.1 mg/dL — ABNORMAL LOW (ref 0.3–1.2)
Total Protein: 7.5 g/dL (ref 6.5–8.1)

## 2022-02-08 LAB — CBG MONITORING, ED: Glucose-Capillary: 117 mg/dL — ABNORMAL HIGH (ref 70–99)

## 2022-02-08 LAB — GLUCOSE, CAPILLARY
Glucose-Capillary: 129 mg/dL — ABNORMAL HIGH (ref 70–99)
Glucose-Capillary: 107 mg/dL — ABNORMAL HIGH (ref 70–99)

## 2022-02-08 LAB — BRAIN NATRIURETIC PEPTIDE: B Natriuretic Peptide: 40.2 pg/mL (ref 0.0–100.0)

## 2022-02-08 MED ORDER — INSULIN ASPART 100 UNIT/ML IJ SOLN
0.0000 [IU] | Freq: Three times a day (TID) | INTRAMUSCULAR | Status: DC
  Administered 2022-02-09: 2 [IU] via SUBCUTANEOUS
  Administered 2022-02-09: 5 [IU] via SUBCUTANEOUS

## 2022-02-08 MED ORDER — PANTOPRAZOLE SODIUM 40 MG PO TBEC
40.0000 mg | DELAYED_RELEASE_TABLET | Freq: Every day | ORAL | Status: DC
  Administered 2022-02-08 – 2022-02-09 (×2): 40 mg via ORAL
  Filled 2022-02-08 (×2): qty 1

## 2022-02-08 MED ORDER — INSULIN GLARGINE-YFGN 100 UNIT/ML ~~LOC~~ SOLN
12.0000 [IU] | Freq: Every day | SUBCUTANEOUS | Status: DC
  Administered 2022-02-09: 12 [IU] via SUBCUTANEOUS
  Filled 2022-02-08: qty 0.12

## 2022-02-08 MED ORDER — RIVAROXABAN 10 MG PO TABS
10.0000 mg | ORAL_TABLET | Freq: Every day | ORAL | Status: DC
  Administered 2022-02-08: 10 mg via ORAL
  Filled 2022-02-08: qty 1

## 2022-02-08 MED ORDER — ALBUTEROL SULFATE (2.5 MG/3ML) 0.083% IN NEBU: 2.5000 mg | INHALATION_SOLUTION | RESPIRATORY_TRACT | Status: DC | PRN

## 2022-02-08 MED ORDER — FLUTICASONE FUROATE-VILANTEROL 200-25 MCG/ACT IN AEPB
1.0000 | INHALATION_SPRAY | Freq: Every day | RESPIRATORY_TRACT | Status: DC
  Administered 2022-02-09: 1 via RESPIRATORY_TRACT
  Filled 2022-02-08: qty 28

## 2022-02-08 MED ORDER — CLOZAPINE 25 MG PO TABS
50.0000 mg | ORAL_TABLET | Freq: Every day | ORAL | Status: DC
  Administered 2022-02-09: 50 mg via ORAL
  Filled 2022-02-08: qty 2

## 2022-02-08 MED ORDER — NICOTINE 7 MG/24HR TD PT24
7.0000 mg | MEDICATED_PATCH | Freq: Every day | TRANSDERMAL | Status: DC
  Administered 2022-02-09: 7 mg via TRANSDERMAL
  Filled 2022-02-08: qty 1

## 2022-02-08 MED ORDER — VALBENAZINE TOSYLATE 40 MG PO CAPS
40.0000 mg | ORAL_CAPSULE | Freq: Every day | ORAL | Status: DC
  Administered 2022-02-09: 40 mg via ORAL
  Filled 2022-02-08: qty 1

## 2022-02-08 MED ORDER — TRAZODONE HCL 50 MG PO TABS: 50.0000 mg | ORAL_TABLET | Freq: Every evening | ORAL | Status: DC | PRN

## 2022-02-08 MED ORDER — ACETAMINOPHEN 325 MG PO TABS: 650.0000 mg | ORAL_TABLET | Freq: Four times a day (QID) | ORAL | Status: DC | PRN

## 2022-02-08 MED ORDER — ARIPIPRAZOLE 10 MG PO TABS
10.0000 mg | ORAL_TABLET | Freq: Every day | ORAL | Status: DC
  Administered 2022-02-09: 10 mg via ORAL
  Filled 2022-02-08: qty 1

## 2022-02-08 MED ORDER — DILTIAZEM HCL ER COATED BEADS 240 MG PO CP24
240.0000 mg | ORAL_CAPSULE | Freq: Every day | ORAL | Status: DC
  Administered 2022-02-09: 240 mg via ORAL
  Filled 2022-02-08: qty 1

## 2022-02-08 MED ORDER — APIXABAN 5 MG PO TABS
5.0000 mg | ORAL_TABLET | Freq: Two times a day (BID) | ORAL | Status: DC
  Administered 2022-02-09: 5 mg via ORAL
  Filled 2022-02-08: qty 1

## 2022-02-08 MED ORDER — DIVALPROEX SODIUM ER 500 MG PO TB24
500.0000 mg | ORAL_TABLET | Freq: Two times a day (BID) | ORAL | Status: DC
  Administered 2022-02-08 – 2022-02-09 (×2): 500 mg via ORAL
  Filled 2022-02-08 (×4): qty 1

## 2022-02-08 NOTE — ED Notes (Signed)
Medications requested to be verified by pharmacy 

## 2022-02-08 NOTE — ED Notes (Addendum)
Up at the bedside commode at this time; call bell in reach, patient verbalizes understanding on how to use call bell

## 2022-02-08 NOTE — H&P (Signed)
Date: 02/08/2022               Patient Name:  Teresa Murillo MRN: [id number]  DOB: [birth year] Age / Sex: 61 y.o., female   PCP: Ileene Hutchinson, PA         Medical Service: Internal Medicine Teaching Service         Attending Physician: Dr. Davonna Belling, MD    First Contact: Dr. Delene Ruffini, MD  Pager: [phone]  Second Contact: Dr. Sanjuana Letters, DO  Pager: [phone]       After Hours (After 5p/  First Contact Pager: [phone]  weekends / holidays): Second Contact Pager: [phone]   Chief Complaint: chest pain  History of Present Illness: Teresa Murillo is a 61 year old female with a past psychiatric history significant for schizophrenia, aggressive behavior, and possible intellectual disability ,T2DM, COPD, HTN, HLD,paroxysmal A-fib Difficult to get an accurate history given tangential speech with loose associations. Appears chest pain is generally central although not clearly substernal. Non-exertional. Patient says while on psych ward not able to smoke but this has decreased the chest pain. Does have a productive cough. She thinks her nausea is related to all the phlegm and coughing. Appears to have lived in a group home for a while. Says she felt safe and did not want to harm anyone there. Does make multiple comments about not wanting to harm or "swing" on the group home leader there. Also says she was paranoid much of the time about upsetting the group home leader. Ultimately says they did not want her to live their and felt she needed psychiatric help. Patient denies hearing or seeing things she shouldn't or that others dont. Does not receive messages from the TV, radio or other external sources. No SI/HI. Meds:   acetaminophen (TYLENOL) tablet 650 mg  650 mg Oral Q6H PRN Revonda Humphrey, NP   650 mg at 02/06/22 0808   albuterol (VENTOLIN HFA) 108 (90 Base) MCG/ACT inhaler 2 puff  2 puff Inhalation Q6H PRN Revonda Humphrey, NP   2 puff at 02/05/22 0642   alum & mag  hydroxide-simeth (MAALOX/MYLANTA) 200-200-20 MG/5ML suspension 30 mL  30 mL Oral Q4H PRN Revonda Humphrey, NP   30 mL at 02/05/22 2110   apixaban (ELIQUIS) tablet 5 mg  5 mg Oral BID Revonda Humphrey, NP   5 mg at 02/06/22 0933   ARIPiprazole (ABILIFY) tablet 10 mg  10 mg Oral Daily Revonda Humphrey, NP   10 mg at 02/06/22 0933   cloZAPine (CLOZARIL) tablet 50 mg  50 mg Oral Daily Revonda Humphrey, NP   50 mg at 02/06/22 0932   cloZAPine (CLOZARIL) tablet 75 mg  75 mg Oral QHS Revonda Humphrey, NP   75 mg at 02/05/22 2109   diclofenac Sodium (VOLTAREN) 1 % topical gel 2 g  2 g Topical BID PRN Rankin, Shuvon B, NP   2 g at 02/03/22 0920   diltiazem (CARDIZEM CD) 24 hr capsule 240 mg  240 mg Oral Daily Revonda Humphrey, NP   240 mg at 02/06/22 0933   divalproex (DEPAKOTE ER) 24 hr tablet 500 mg  500 mg Oral BID Revonda Humphrey, NP   500 mg at 02/06/22 0932   fluticasone furoate-vilanterol (BREO ELLIPTA) 200-25 MCG/ACT 1 puff  1 puff Inhalation Daily Revonda Humphrey, NP   1 puff at 02/06/22 0808   haloperidol (HALDOL) tablet 10 mg  10 mg Oral TID PRN Chana Bode,  Victory Dakin, NP       hydrOXYzine (ATARAX) tablet 25 mg  25 mg Oral TID PRN Revonda Humphrey, NP   25 mg at 02/05/22 2109   insulin aspart (novoLOG) injection 0-9 Units  0-9 Units Subcutaneous TID WC Revonda Humphrey, NP   1 Units at 02/06/22 0811   insulin glargine-yfgn (SEMGLEE) injection 12 Units  12 Units Subcutaneous Q1200 Revonda Humphrey, NP   12 Units at 02/05/22 1201   latanoprost (XALATAN) 0.005 % ophthalmic solution 1 drop  1 drop Both Eyes QHS Thomes Lolling H, NP   1 drop at 02/05/22 2113   loperamide (IMODIUM) capsule 2-4 mg  2-4 mg Oral PRN Rankin, Shuvon B, NP   4 mg at 02/03/22 1411   magnesium hydroxide (MILK OF MAGNESIA) suspension 30 mL  30 mL Oral Daily PRN Revonda Humphrey, NP       menthol-cetylpyridinium (CEPACOL) lozenge 3 mg  1 lozenge Oral TID PRN Onuoha, Chinwendu V, NP   3 mg at 02/05/22 2110    metFORMIN (GLUCOPHAGE-XR) 24 hr tablet 1,000 mg  1,000 mg Oral BID WC Revonda Humphrey, NP   1,000 mg at 02/06/22 [id number]   nicotine (NICODERM CQ - dosed in mg/24 hr) patch 7 mg  7 mg Transdermal Daily Revonda Humphrey, NP   7 mg at 02/06/22 0933   traZODone (DESYREL) tablet 50 mg  50 mg Oral QHS PRN Revonda Humphrey, NP   50 mg at 02/05/22 2109   valbenazine (INGREZZA) capsule 40 mg  40 mg Oral q AM Revonda Humphrey, NP   40 mg at 02/06/22 0630   Vitamin D (Ergocalciferol) (DRISDOL) 1.25 MG (50000 UNIT) capsule 50,000 Units  50,000 Units Oral Q Sharren Bridge, NP   50,000 Units at 02/04/22 [id number]      Allergies: Allergies as of 02/08/2022 - Review Complete 02/08/2022  Allergen Reaction Noted   Penicillins Other (See Comments) 12/14/2014   Penicillin g Other (See Comments) 11/21/2021   Past Medical History:  Diagnosis Date   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 On the Wechsler Adult Intelligence Scale-4, Ms. Tackitt achieved a full-scale IQ score of 73 and a percentile rank of 4 placing her in the borderline range of intellectual functioning.    Chronic obstructive pulmonary disease (COPD) (HCC)    Glaucoma    Hyperlipidemia    Hypertension    Iron deficiency    Schizoaffective disorder (HCC)    Type 2 diabetes mellitus ([place])     Family History:  Family History  Problem Relation Age of Onset   Breast cancer Maternal Grandmother      Social History:  Social History   Socioeconomic History   Marital status: Divorced    Spouse name: Not on file   Number of children: Not on file   Years of education: Not on file   Highest education level: Not on file  Occupational History   Not on file  Tobacco Use   Smoking status: Former    Packs/day: 1.00    Types: Cigars, Cigarettes    Quit date: 11/09/2021    Years since quitting: 0.2   Smokeless tobacco: Current  Vaping Use   Vaping Use:  Never used  Substance and Sexual Activity   Alcohol use: Not Currently   Drug use: Not Currently   Sexual activity: Not Currently    Birth control/protection: Surgical  Other Topics Concern  Not on file  Social History Narrative   Not on file   Social Determinants of Health   Financial Resource Strain: Not on file  Food Insecurity: Not on file  Transportation Needs: Not on file  Physical Activity: Not on file  Stress: Not on file  Social Connections: Not on file  Intimate Partner Violence: Not on file     Review of Systems: A complete ROS was negative except as per HPI.   Physical Exam: Blood pressure (!) 104/94, pulse (!) 118, resp. rate 20, height 5\' 2"  (1.575 m), weight 80.7 kg, SpO2 93 %. Gen: No acute distress, laying in bed pleasant and cooperative HEENT: No scleral icterus, no conjunctival pallor , regular rhythm, normal s1/s2, no m/r/g, 2+bilateral radial pulses Pulm:Normal wob, LCTAB RU:[id number], epigastric ptp, no rebound or guarding, no palpable masses Extremities: warm, dry, no LE edema Psych: A&Ox4.Patient appears euthymic with restricted range of affect. Speech is normal in tone and volume, hyperverbal but intermittently interruptable at increased rate, tangential with loose associations. Paranoid ideation. No apparent hallucinations or response to internal stimuli. No odd behavior and redirectable.Does not appear to have great insight into medical care.No SI/HI  EKG: personally reviewed my interpretation is Enlarged biphasic P waves in I,II,V1, diffuse PR depressions present but worse from prior, sinus tachycardia  CXR: personally reviewed my interpretation is no air space opacities, no effusions, no pneumothorax or other acute cardiopulmonary process  Assessment & Plan by Problem: Active Problems:   * No active hospital problems. *  Pericardial effusionTachycardia Atypical chest pain Initially noted to have moderate paracardial effusion  09/10/2021 on echo, reevaluated on echo 11/09/2021 with interval decrease in size labeled as small effusion.Repeat echo ordered today to evaluate as a source of her persistent sinus tachycardia, cardiology consulted by ED. Will not be done tonight. Has had  two CTA PE studies since 08/2021 and both negative. Patient endorses central, although not specifically substernal,chest pain that is not exertional and has improved with smoking cessation. She has been noted to have coronary calcifications on CT. Does not seem to be ischemic in nature with no prior elevated troponin or ischemic EKG changes but a stress test may be an eventual consideration. Given her reported history of productive cough, low oxygen saturations of around 94% on RA and smoking history would also benefit from PFTs in the outpatient setting in the setting of tachycardia and chest pain. Given reported pain after meals and epigastric ptp will try a PPI for possible GERD. Do not want to rely on this without adequate work up, but given her extensive psychiatric history the chest pain and tachy could be secondary to anxiety and would be a diagnosis of exclusion. -F/u echo -pantoprazole 40mg  daily -tele  Microcytic Anemia Hgb at 10.7 around baseline. Low MCV of 77 and increased RDW of 16.5 with ferritin of 2 and iron sat of 4 indicative of iron deficiency anemia. 810 mcg deficit Should have age appropriate colonoscopy to rule out colon cancer as a cause. -IV iron  Schizophrenia Appears patient initially presented to [facility] 9/25 voluntarily via [place] police after being dismissed from group home due to multiple elopements. Has since been there awaiting placement. DSS is involved. Brief visit at [facility] for chest pain 11/26 with return to [facility] after. Now back again to [facility] and still awaiting long term placement. -valbenazine 40mg  qAM -Depakote 500mg  ER BID -Aripiprazole 10mg  daily -Clozapine 50mg  daily, Cloazpine 75mg  qHS -Trazadone 50mg  qHS  prn -Atarax 25mg  TID PRN -Haldol 10mg   TID PRN  Atrila fibrillation -Eliquis 5mg  BID -Diltiazem 240mg  daily  COPD -Fluticasone-vilanterol 1 puff daily -albuterol prn  T2DM -Metformin 1000mg  BID WC -Semglee 12u qHS -SSI TID WC Dispo: Admit patient to Inpatient with expected length of stay greater than 2 midnights.  Signed: Iona Coach, MD 02/08/2022, 7:01 PM  Pager: [phone] After 5pm on weekdays and 1pm on weekends: On Call pager: [phone]

## 2022-02-08 NOTE — ED Triage Notes (Signed)
Pt BIB EMS for SVT and chest pain when eating breakfast this morning that only occurs when eating. 112 HR w/ EMS. Aox4. No apparent distress at this time.

## 2022-02-08 NOTE — ED Notes (Signed)
Patient alert and oriented x 3. Denies SI/HI/AVH. Denies intent or plan to harm self or others. Routine conducted according to faculty protocol. Encourage patient to notify staff with any needs or concerns. Patient verbalized agreement and understanding. Will continue to monitor for safety. 

## 2022-02-08 NOTE — ED Notes (Signed)
Pt complaining of chest pain and chest tightness. VS checked and noted elevated HR of 141 to 148. Notified provider immediately. Pt is alert oriented and able to make needs known.

## 2022-02-08 NOTE — ED Notes (Signed)
Ambulates to restroom at this time with a steady gait 

## 2022-02-08 NOTE — ED Provider Notes (Signed)
[facility] EMERGENCY DEPARTMENT Provider Note   CSN: [id number] Arrival date & time: 02/08/22  1525     History  Chief Complaint  Patient presents with   Tachycardia    SVT    Teresa Murillo is a 61 y.o. female.  HPI Patient presents reported shortness of chest pain.  Sent from behavioral health urgent care where she has been for a while now pending placement.  Reportedly shortness of breath and chest pain with eating.  Patient does have a borderline intellectual functioning and somewhat difficult to get history from.  States she has had a cardiac arrest in the past.  Reportedly has heart rate to go up to 150s with eating.  Per EMS it was SVT however rhythm here is sinus and I do not see traces from them.  Reviewed notes from previous admissions and previous cardiac notes.  Head CT a about 2 weeks ago that showed a moderate pericardial effusion.  She has had this before and had echocardiograms for it and July and September.  Does not have swelling in her legs.   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 On the Wechsler Adult Intelligence Scale-4, Ms. Seeberger achieved a full-scale IQ score of 73 and a percentile rank of 4 placing her in the borderline range of intellectual functioning.    Chronic obstructive pulmonary disease (COPD) (HCC)    Glaucoma    Hyperlipidemia    Hypertension    Iron deficiency    Schizoaffective disorder (HCC)    Type 2 diabetes mellitus ([place])     Home Medications Prior to Admission medications   Medication Sig Start Date End Date Taking? Authorizing Provider  Accu-Chek Softclix Lancets lancets Use as directed up to four times daily 11/16/21   Thurnell Lose, MD  apixaban (ELIQUIS) 5 MG TABS tablet Take 1 tablet (5 mg total) by mouth 2 (two) times daily. 11/16/21   Thurnell Lose, MD  ARIPiprazole (ABILIFY) 10 MG tablet  Take 1 tablet (10 mg total) by mouth daily. 11/17/21   Thurnell Lose, MD  Blood Glucose Monitoring Suppl (ACCU-CHEK GUIDE) w/Device KIT Use as directed up to four times daily 11/16/21   Thurnell Lose, MD  budesonide-formoterol [facility]) 160-4.5 MCG/ACT inhaler Inhale 2 puffs into the lungs in the morning and at bedtime. 01/08/21   [provider]  Cholecalciferol (VITAMIN D3) 1.25 MG (50000 UT) CAPS Take 50,000 Units by mouth every Thursday.    [provider]  cloZAPine (CLOZARIL) 25 MG tablet Take 3 tablets (75 mg total) by mouth at bedtime. 11/16/21   Thurnell Lose, MD  clozapine (CLOZARIL) 50 MG tablet Take 1 tablet (50 mg total) by mouth daily. 11/17/21   Thurnell Lose, MD  diltiazem (CARDIZEM CD) 240 MG 24 hr capsule Take 1 capsule (240 mg total) by mouth daily. Patient not taking: Reported on 11/24/2021 11/17/21   Thurnell Lose, MD  divalproex (DEPAKOTE ER) 500 MG 24 hr tablet Take 1 tablet (500 mg total) by mouth 2 (two) times daily. 11/16/21   Thurnell Lose, MD  glucose blood test strip Use as directed up to four times daily 11/16/21   Thurnell Lose, MD  haloperidol (HALDOL) 10 MG tablet Take 1 tablet (10 mg total) by mouth 3 (three) times daily as needed for agitation (and psychotic symptoms). 11/16/21   Thurnell Lose, MD  INGREZZA 40  MG capsule Take 1 capsule (40 mg total) by mouth in the morning. 11/16/21   Thurnell Lose, MD  insulin aspart (NOVOLOG) 100 UNIT/ML FlexPen Before each meal 3 times a day, 140-199 - 2 units, 200-250 - 4 units, 251-299 - 6 units,  300-349 - 8 units,  350 or above 10 units. Insulin PEN if approved, provide syringes and needles if needed.Please switch to any approved short acting Insulin if needed. 11/16/21   Thurnell Lose, MD  insulin glargine (LANTUS) 100 UNIT/ML Solostar Pen Inject 12 Units into the skin daily. 11/17/21   Thurnell Lose, MD  Insulin Pen Needle 32G X 4 MM MISC Use 4 times a day with insulin, 1  month supply. 11/16/21   Thurnell Lose, MD  latanoprost (XALATAN) 0.005 % ophthalmic solution Place 1 drop into both eyes at bedtime.    [provider]  metFORMIN (GLUCOPHAGE) 500 MG tablet Take 500 mg by mouth 2 (two) times daily with a meal.    [provider]  nicotine (NICODERM CQ - DOSED IN MG/24 HR) 7 mg/24hr patch Place 1 patch (7 mg total) onto the skin daily. 10/22/21   Corky Sox, MD  PROAIR HFA 108 [phone] Base) MCG/ACT inhaler Inhale 2 puffs into the lungs every 6 (six) hours as needed for wheezing or shortness of breath.    [provider]      Allergies    Penicillins and Penicillin g    Review of Systems   Review of Systems  Physical Exam Updated Vital Signs BP (!) 153/102   Pulse (!) 107   Temp 97.9 F (36.6 C) (Oral)   Resp 20   Ht _0  (1.575 m)   Wt 80.7 kg   SpO2 95%   BMI 32.54 kg/m  Physical Exam Vitals and nursing note reviewed.  HENT:     Head: Normocephalic.  Cardiovascular:     Rate and Rhythm: Regular rhythm. Tachycardia present.  Pulmonary:     Breath sounds: No rhonchi or rales.  Abdominal:     Tenderness: There is no abdominal tenderness.  Musculoskeletal:     Right lower leg: No edema.     Left lower leg: No edema.  Skin:    General: Skin is warm.  Neurological:     Mental Status: She is alert. Mental status is at baseline.   Trace edema lower extremities  ED Results / Procedures / Treatments   Labs (all labs ordered are listed, but only abnormal results are displayed) Labs Reviewed  CBC - Abnormal; Notable for the following components:      Result Value   Hemoglobin 10.7 (*)    HCT 33.1 (*)    MCV 77.0 (*)    MCH 24.9 (*)    RDW 16.5 (*)    All other components within normal limits  COMPREHENSIVE METABOLIC PANEL - Abnormal; Notable for the following components:   Sodium 131 (*)    Chloride 96 (*)    Glucose, Bld 126 (*)    Total Bilirubin 0.1 (*)    All other components within normal limits   CBG MONITORING, ED - Abnormal; Notable for the following components:   Glucose-Capillary 117 (*)    All other components within normal limits  BRAIN NATRIURETIC PEPTIDE  BASIC METABOLIC PANEL  CBC  IRON AND TIBC  FERRITIN  TROPONIN I (HIGH SENSITIVITY)  TROPONIN I (HIGH SENSITIVITY)    EKG EKG Interpretation  Date/Time:  Monday February 08 2022 15:34:03  EST Ventricular Rate:  118 PR Interval:  137 QRS Duration: 75 QT Interval:  307 QTC Calculation: 431 R Axis:   49 Text Interpretation: Sinus tachycardia Probable left atrial enlargement No significant change since last tracing Confirmed by Davonna Belling [phone]) on 02/08/2022 3:53:28 PM  Radiology DG Chest Portable 1 View  Result Date: 02/08/2022 CLINICAL DATA:  Provided history: Shortness of breath. Additional history provided: Patient transported by ambulance for SVT and chest pain. EXAM: PORTABLE CHEST 1 VIEW COMPARISON:  Prior chest radiographs 01/23/2022 and earlier. Chest CT 01/24/2022. FINDINGS: Enlarged cardiopericardial silhouette, similar to the prior chest radiographs of 01/23/2022. No appreciable airspace consolidation or pulmonary edema. No evidence of pleural effusion or pneumothorax. No acute bony abnormality identified. Dextrocurvature of the upper to mid thoracic spine, possibly positional. IMPRESSION: 1. Enlarged cardiopericardial silhouette, similar to the prior chest radiographs of 01/23/2022. 2. No appreciable airspace consolidation or pulmonary edema. 3. Dextrocurvature of the upper to mid thoracic spine, possibly positional. Electronically Signed   By: Kellie Simmering D.O.   On: 02/08/2022 16:05    Procedures Procedures    Medications Ordered in ED Medications  acetaminophen (TYLENOL) tablet 650 mg (has no administration in time range)  albuterol (PROVENTIL) (2.5 MG/3ML) 0.083% nebulizer solution 2.5 mg (has no administration in time range)  pantoprazole (PROTONIX) EC tablet 40 mg (40 mg Oral Given  02/08/22 2333)  apixaban (ELIQUIS) tablet 5 mg (5 mg Oral Not Given 02/08/22 2137)  ARIPiprazole (ABILIFY) tablet 10 mg (has no administration in time range)  cloZAPine (CLOZARIL) tablet 50 mg (has no administration in time range)  diltiazem (CARDIZEM CD) 24 hr capsule 240 mg (has no administration in time range)  divalproex (DEPAKOTE ER) 24 hr tablet 500 mg (500 mg Oral Given 02/08/22 2334)  fluticasone furoate-vilanterol (BREO ELLIPTA) 200-25 MCG/ACT 1 puff (has no administration in time range)  insulin glargine-yfgn (SEMGLEE) injection 12 Units (has no administration in time range)  insulin aspart (novoLOG) injection 0-15 Units (has no administration in time range)  valbenazine (INGREZZA) capsule 40 mg (has no administration in time range)  traZODone (DESYREL) tablet 50 mg (has no administration in time range)  nicotine (NICODERM CQ - dosed in mg/24 hr) patch 7 mg (has no administration in time range)    ED Course/ Medical Decision Making/ A&P                           Medical Decision Making Amount and/or Complexity of Data Reviewed Labs: ordered. Radiology: ordered.  Risk Decision regarding hospitalization.   Patient is shortness of breath and chest pain.  Tachycardia with eating.  Reportedly has had this for a while but appears to gotten worse.  Reviewing records has had a pericardial effusion.  Has had previous atrial fibrillation.  Has been in a sinus rhythm now.  Referential diagnosis does include CHF electrolyte abnormality and potential worsening of the effusion.  Will discuss with cardiology.  Will also get x-ray and basic blood work.  Discussed with Dr. Percival Spanish from cardiology.  Will get limited echo to evaluate for pericardial effusion.  It appears as if echo lab is already left for the day.  With the tachycardia and a known effusion I feel she benefit from admission to the hospital for monitoring and echocardiogram tomorrow.  Will discuss with unassigned  medicine.       Final Clinical Impression(s) / ED Diagnoses Final diagnoses:  Tachycardia  Pericardial effusion    Rx / DC  Orders ED Discharge Orders     None         Davonna Belling, MD 02/09/22 0002

## 2022-02-08 NOTE — ED Provider Notes (Signed)
Behavioral Health Progress Note  Date and Time: 02/08/2022 1:19 PM Name: Teresa Murillo MRN:  [id number]  Subjective:    Teresa Murillo is a 61 year old female with a past psychiatric history significant for schizophrenia, aggressive behavior, and possible intellectual disability who presented to [facility] on 11/23/2021 as a walk-in. At the time, patient had been dismissed from the group home due to multiple elopements. Patient has remained at [facility] while awaiting new placement. Social work and DSS are involved.  Teresa Murillo, 61 y.o., female patient seen face to face by this provider, consulted with Dr. Hampton Abbot; and chart reviewed on 02/08/22.  On evaluation Teresa Murillo reports there is no changes.  She continues to deny suicidal/self-harm/homicidal ideations, psychosis, paranoia.  Reports she is eating and sleeping without any difficulty; and continues to tolerate her medications without any adverse reaction.   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 On the Wechsler Adult Intelligence Scale-4, Ms. Windom achieved a full-scale IQ score of 73 and a percentile rank of 4 placing her in the borderline range of intellectual functioning.    Chronic obstructive pulmonary disease (COPD) (HCC)    Glaucoma    Hyperlipidemia    Hypertension    Iron deficiency    Schizoaffective disorder (HCC)    Type 2 diabetes mellitus (HCC)     Past Surgical History:  Procedure Laterality Date   TUBAL LIGATION     Family History:  Family History  Problem Relation Age  of Onset   Breast cancer Maternal Grandmother    Family Psychiatric  History: See H&P Social History:  Social History   Substance and Sexual Activity  Alcohol Use Yes     Social History   Substance and Sexual Activity  Drug Use Not Currently    Social History   Socioeconomic History   Marital status: Divorced    Spouse name: Not on file   Number of children: Not on file   Years of education: Not on file   Highest education level: Not on file  Occupational History   Not on file  Tobacco Use   Smoking status: Every Day    Packs/day: 1.00    Types: Cigars, Cigarettes   Smokeless tobacco: Current  Vaping Use   Vaping Use: Never used  Substance and Sexual Activity   Alcohol use: Yes   Drug use: Not Currently   Sexual activity: Not Currently    Birth control/protection: Surgical  Other Topics Concern   Not on file  Social History Narrative   Not on file   Social Determinants of Health   Financial Resource Strain: Not on file  Food Insecurity: Not on file  Transportation Needs: Not on file  Physical Activity: Not on file  Stress: Not on file  Social Connections: Not on file   SDOH:  SDOH Screenings   Depression (PHQ2-9): Low Risk  (11/23/2021)  Tobacco Use: High Risk (02/08/2022)   Additional Social History:  See H&P  Sleep: Fair  Appetite:  Good  Current Medications:  Current Facility-Administered Medications  Medication Dose Route Frequency Provider Last Rate Last Admin   acetaminophen (TYLENOL)  tablet 650 mg  650 mg Oral Q6H PRN Revonda Humphrey, NP   650 mg at 02/07/22 2014   albuterol (VENTOLIN HFA) 108 (90 Base) MCG/ACT inhaler 2 puff  2 puff Inhalation Q6H PRN Revonda Humphrey, NP   2 puff at 02/05/22 0642   alum & mag hydroxide-simeth (MAALOX/MYLANTA) 200-200-20 MG/5ML suspension 30 mL  30 mL Oral Q4H PRN Revonda Humphrey, NP   30 mL at 02/05/22 2110   apixaban (ELIQUIS) tablet 5 mg  5 mg Oral BID Revonda Humphrey, NP   5 mg at 02/08/22  9450   ARIPiprazole (ABILIFY) tablet 10 mg  10 mg Oral Daily Revonda Humphrey, NP   10 mg at 02/08/22 3888   cloZAPine (CLOZARIL) tablet 50 mg  50 mg Oral Daily Revonda Humphrey, NP   50 mg at 02/08/22 2800   cloZAPine (CLOZARIL) tablet 75 mg  75 mg Oral QHS Revonda Humphrey, NP   75 mg at 02/07/22 2206   diclofenac Sodium (VOLTAREN) 1 % topical gel 2 g  2 g Topical BID PRN Claudell Rhody B, NP   2 g at 02/03/22 0920   diltiazem (CARDIZEM CD) 24 hr capsule 240 mg  240 mg Oral Daily Revonda Humphrey, NP   240 mg at 02/08/22 3491   divalproex (DEPAKOTE ER) 24 hr tablet 500 mg  500 mg Oral BID Revonda Humphrey, NP   500 mg at 02/08/22 0938   fluticasone furoate-vilanterol (BREO ELLIPTA) 200-25 MCG/ACT 1 puff  1 puff Inhalation Daily Revonda Humphrey, NP   1 puff at 02/08/22 0801   haloperidol (HALDOL) tablet 10 mg  10 mg Oral TID PRN Revonda Humphrey, NP       hydrOXYzine (ATARAX) tablet 25 mg  25 mg Oral TID PRN Revonda Humphrey, NP   25 mg at 02/07/22 2206   insulin aspart (novoLOG) injection 0-9 Units  0-9 Units Subcutaneous TID WC Revonda Humphrey, NP   1 Units at 02/08/22 0734   insulin glargine-yfgn (SEMGLEE) injection 12 Units  12 Units Subcutaneous Q1200 Revonda Humphrey, NP   12 Units at 02/08/22 1153   latanoprost (XALATAN) 0.005 % ophthalmic solution 1 drop  1 drop Both Eyes QHS Revonda Humphrey, NP   1 drop at 02/07/22 2208   loperamide (IMODIUM) capsule 2-4 mg  2-4 mg Oral PRN Kewanda Poland B, NP   4 mg at 02/03/22 1411   magnesium hydroxide (MILK OF MAGNESIA) suspension 30 mL  30 mL Oral Daily PRN Revonda Humphrey, NP       menthol-cetylpyridinium (CEPACOL) lozenge 3 mg  1 lozenge Oral TID PRN Onuoha, Chinwendu V, NP   3 mg at 02/07/22 2250   metFORMIN (GLUCOPHAGE-XR) 24 hr tablet 1,000 mg  1,000 mg Oral BID WC Revonda Humphrey, NP   1,000 mg at 02/08/22 0734   metroNIDAZOLE (METROGEL) 0.75 % vaginal gel 1 Applicatorful  1 Applicatorful Vaginal QHS Lucky Rathke, FNP   1 Applicatorful at 79/15/05 2210   nicotine (NICODERM CQ - dosed in mg/24 hr) patch 7 mg  7 mg Transdermal Daily Revonda Humphrey, NP   7 mg at 02/08/22 6979   traZODone (DESYREL) tablet 50 mg  50 mg Oral QHS PRN Revonda Humphrey, NP   50 mg at 02/07/22 2206   valbenazine (INGREZZA) capsule 40 mg  40 mg Oral q AM Revonda Humphrey, NP   40 mg at 02/08/22 [phone]  Vitamin D (Ergocalciferol) (DRISDOL) 1.25 MG (50000 UNIT) capsule 50,000 Units  50,000 Units Oral Q Sharren Bridge, NP   50,000 Units at 02/04/22 0919   Current Outpatient Medications  Medication Sig Dispense Refill   Accu-Chek Softclix Lancets lancets Use as directed up to four times daily 100 each 0   apixaban (ELIQUIS) 5 MG TABS tablet Take 1 tablet (5 mg total) by mouth 2 (two) times daily. 60 tablet 0   ARIPiprazole (ABILIFY) 10 MG tablet Take 1 tablet (10 mg total) by mouth daily. 30 tablet 0   Blood Glucose Monitoring Suppl (ACCU-CHEK GUIDE) w/Device KIT Use as directed up to four times daily 1 kit 0   budesonide-formoterol (SYMBICORT) 160-4.5 MCG/ACT inhaler Inhale 2 puffs into the lungs in the morning and at bedtime.     Cholecalciferol (VITAMIN D3) 1.25 MG (50000 UT) CAPS Take 50,000 Units by mouth every Thursday.     cloZAPine (CLOZARIL) 25 MG tablet Take 3 tablets (75 mg total) by mouth at bedtime. 90 tablet 0   clozapine (CLOZARIL) 50 MG tablet Take 1 tablet (50 mg total) by mouth daily. 30 tablet 0   diltiazem (CARDIZEM CD) 240 MG 24 hr capsule Take 1 capsule (240 mg total) by mouth daily. (Patient not taking: Reported on 11/24/2021) 30 capsule 0   divalproex (DEPAKOTE ER) 500 MG 24 hr tablet Take 1 tablet (500 mg total) by mouth 2 (two) times daily. 60 tablet 0   glucose blood test strip Use as directed up to four times daily 50 each 0   haloperidol (HALDOL) 10 MG tablet Take 1 tablet (10 mg total) by mouth 3 (three) times daily as needed for agitation (and psychotic symptoms).     INGREZZA 40 MG capsule  Take 1 capsule (40 mg total) by mouth in the morning. 30 capsule 0   insulin aspart (NOVOLOG) 100 UNIT/ML FlexPen Before each meal 3 times a day, 140-199 - 2 units, 200-250 - 4 units, 251-299 - 6 units,  300-349 - 8 units,  350 or above 10 units. Insulin PEN if approved, provide syringes and needles if needed.Please switch to any approved short acting Insulin if needed. 15 mL 0   insulin glargine (LANTUS) 100 UNIT/ML Solostar Pen Inject 12 Units into the skin daily. 15 mL 0   Insulin Pen Needle 32G X 4 MM MISC Use 4 times a day with insulin, 1 month supply. 100 each 0   latanoprost (XALATAN) 0.005 % ophthalmic solution Place 1 drop into both eyes at bedtime.     metFORMIN (GLUCOPHAGE) 500 MG tablet Take 500 mg by mouth 2 (two) times daily with a meal.     nicotine (NICODERM CQ - DOSED IN MG/24 HR) 7 mg/24hr patch Place 1 patch (7 mg total) onto the skin daily. 28 patch 0   PROAIR HFA 108 (90 Base) MCG/ACT inhaler Inhale 2 puffs into the lungs every 6 (six) hours as needed for wheezing or shortness of breath.      Labs  Lab Results:  Admission on 01/24/2022  Component Date Value Ref Range Status   Glucose-Capillary 01/24/2022 143 (H)  70 - 99 mg/dL Final   Glucose reference range applies only to samples taken after fasting for at least 8 hours.   Glucose-Capillary 01/24/2022 120 (H)  70 - 99 mg/dL Final   Glucose reference range applies only to samples taken after fasting for at least 8 hours.   Glucose-Capillary 01/25/2022 122 (H)  70 - 99 mg/dL Final  Glucose reference range applies only to samples taken after fasting for at least 8 hours.   Glucose-Capillary 01/25/2022 190 (H)  70 - 99 mg/dL Final   Glucose reference range applies only to samples taken after fasting for at least 8 hours.   Glucose-Capillary 01/25/2022 163 (H)  70 - 99 mg/dL Final   Glucose reference range applies only to samples taken after fasting for at least 8 hours.   WBC 01/26/2022 6.5  4.0 - 10.5 K/uL Final   RBC  01/26/2022 4.53  3.87 - 5.11 MIL/uL Final   Hemoglobin 01/26/2022 11.3 (L)  12.0 - 15.0 g/dL Final   HCT 01/26/2022 36.4  36.0 - 46.0 % Final   MCV 01/26/2022 80.4  80.0 - 100.0 fL Final   MCH 01/26/2022 24.9 (L)  26.0 - 34.0 pg Final   MCHC 01/26/2022 31.0  30.0 - 36.0 g/dL Final   RDW 01/26/2022 17.3 (H)  11.5 - 15.5 % Final   Platelets 01/26/2022 327  150 - 400 K/uL Final   nRBC 01/26/2022 0.0  0.0 - 0.2 % Final   Neutrophils Relative % 01/26/2022 60  % Final   Neutro Abs 01/26/2022 3.9  1.7 - 7.7 K/uL Final   Lymphocytes Relative 01/26/2022 31  % Final   Lymphs Abs 01/26/2022 2.0  0.7 - 4.0 K/uL Final   Monocytes Relative 01/26/2022 7  % Final   Monocytes Absolute 01/26/2022 0.5  0.1 - 1.0 K/uL Final   Eosinophils Relative 01/26/2022 1  % Final   Eosinophils Absolute 01/26/2022 0.1  0.0 - 0.5 K/uL Final   Basophils Relative 01/26/2022 1  % Final   Basophils Absolute 01/26/2022 0.1  0.0 - 0.1 K/uL Final   Immature Granulocytes 01/26/2022 0  % Final   Abs Immature Granulocytes 01/26/2022 0.02  0.00 - 0.07 K/uL Final   Performed at [facility], [place] [address], [place], [place] [address]   Glucose-Capillary 01/26/2022 149 (H)  70 - 99 mg/dL Final   Glucose reference range applies only to samples taken after fasting for at least 8 hours.   Glucose-Capillary 01/26/2022 130 (H)  70 - 99 mg/dL Final   Glucose reference range applies only to samples taken after fasting for at least 8 hours.   Glucose-Capillary 01/26/2022 151 (H)  70 - 99 mg/dL Final   Glucose reference range applies only to samples taken after fasting for at least 8 hours.   Glucose-Capillary 01/26/2022 170 (H)  70 - 99 mg/dL Final   Glucose reference range applies only to samples taken after fasting for at least 8 hours.   Glucose-Capillary 01/27/2022 129 (H)  70 - 99 mg/dL Final   Glucose reference range applies only to samples taken after fasting for at least 8 hours.   Glucose-Capillary 01/27/2022 101 (H)  70 -  99 mg/dL Final   Glucose reference range applies only to samples taken after fasting for at least 8 hours.   Glucose-Capillary 01/27/2022 220 (H)  70 - 99 mg/dL Final   Glucose reference range applies only to samples taken after fasting for at least 8 hours.   Glucose-Capillary 01/27/2022 154 (H)  70 - 99 mg/dL Final   Glucose reference range applies only to samples taken after fasting for at least 8 hours.   Glucose-Capillary 01/27/2022 163 (H)  70 - 99 mg/dL Final   Glucose reference range applies only to samples taken after fasting for at least 8 hours.   Glucose-Capillary 01/28/2022 127 (H)  70 -  99 mg/dL Final   Glucose reference range applies only to samples taken after fasting for at least 8 hours.   Glucose-Capillary 01/28/2022 110 (H)  70 - 99 mg/dL Final   Glucose reference range applies only to samples taken after fasting for at least 8 hours.   Glucose-Capillary 01/28/2022 167 (H)  70 - 99 mg/dL Final   Glucose reference range applies only to samples taken after fasting for at least 8 hours.   Glucose-Capillary 01/29/2022 145 (H)  70 - 99 mg/dL Final   Glucose reference range applies only to samples taken after fasting for at least 8 hours.   Glucose-Capillary 01/29/2022 203 (H)  70 - 99 mg/dL Final   Glucose reference range applies only to samples taken after fasting for at least 8 hours.   Glucose-Capillary 01/29/2022 153 (H)  70 - 99 mg/dL Final   Glucose reference range applies only to samples taken after fasting for at least 8 hours.   Glucose-Capillary 01/29/2022 166 (H)  70 - 99 mg/dL Final   Glucose reference range applies only to samples taken after fasting for at least 8 hours.   Glucose-Capillary 01/30/2022 142 (H)  70 - 99 mg/dL Final   Glucose reference range applies only to samples taken after fasting for at least 8 hours.   Glucose-Capillary 01/30/2022 138 (H)  70 - 99 mg/dL Final   Glucose reference range applies only to samples taken after fasting for at least 8  hours.   Glucose-Capillary 01/30/2022 185 (H)  70 - 99 mg/dL Final   Glucose reference range applies only to samples taken after fasting for at least 8 hours.   Glucose-Capillary 01/30/2022 220 (H)  70 - 99 mg/dL Final   Glucose reference range applies only to samples taken after fasting for at least 8 hours.   Glucose-Capillary 01/31/2022 147 (H)  70 - 99 mg/dL Final   Glucose reference range applies only to samples taken after fasting for at least 8 hours.   Glucose-Capillary 01/31/2022 131 (H)  70 - 99 mg/dL Final   Glucose reference range applies only to samples taken after fasting for at least 8 hours.   Glucose-Capillary 01/31/2022 169 (H)  70 - 99 mg/dL Final   Glucose reference range applies only to samples taken after fasting for at least 8 hours.   Glucose-Capillary 01/31/2022 144 (H)  70 - 99 mg/dL Final   Glucose reference range applies only to samples taken after fasting for at least 8 hours.   Comment 1 01/31/2022 RN AWARE   Final   Glucose-Capillary 02/01/2022 117 (H)  70 - 99 mg/dL Final   Glucose reference range applies only to samples taken after fasting for at least 8 hours.   Glucose-Capillary 02/01/2022 180 (H)  70 - 99 mg/dL Final   Glucose reference range applies only to samples taken after fasting for at least 8 hours.   Glucose-Capillary 02/01/2022 209 (H)  70 - 99 mg/dL Final   Glucose reference range applies only to samples taken after fasting for at least 8 hours.   Glucose-Capillary 02/02/2022 131 (H)  70 - 99 mg/dL Final   Glucose reference range applies only to samples taken after fasting for at least 8 hours.   WBC 02/02/2022 7.5  4.0 - 10.5 K/uL Final   RBC 02/02/2022 4.52  3.87 - 5.11 MIL/uL Final   Hemoglobin 02/02/2022 11.3 (L)  12.0 - 15.0 g/dL Final   HCT 02/02/2022 35.0 (L)  36.0 - 46.0 % Final  MCV 02/02/2022 77.4 (L)  80.0 - 100.0 fL Final   MCH 02/02/2022 25.0 (L)  26.0 - 34.0 pg Final   MCHC 02/02/2022 32.3  30.0 - 36.0 g/dL Final   RDW  02/02/2022 16.7 (H)  11.5 - 15.5 % Final   Platelets 02/02/2022 345  150 - 400 K/uL Final   nRBC 02/02/2022 0.0  0.0 - 0.2 % Final   Neutrophils Relative % 02/02/2022 63  % Final   Neutro Abs 02/02/2022 4.7  1.7 - 7.7 K/uL Final   Lymphocytes Relative 02/02/2022 28  % Final   Lymphs Abs 02/02/2022 2.1  0.7 - 4.0 K/uL Final   Monocytes Relative 02/02/2022 7  % Final   Monocytes Absolute 02/02/2022 0.5  0.1 - 1.0 K/uL Final   Eosinophils Relative 02/02/2022 1  % Final   Eosinophils Absolute 02/02/2022 0.1  0.0 - 0.5 K/uL Final   Basophils Relative 02/02/2022 1  % Final   Basophils Absolute 02/02/2022 0.1  0.0 - 0.1 K/uL Final   Immature Granulocytes 02/02/2022 0  % Final   Abs Immature Granulocytes 02/02/2022 0.03  0.00 - 0.07 K/uL Final   Performed at [facility], [place] [address] [place], [place] [address]   Glucose-Capillary 02/02/2022 95  70 - 99 mg/dL Final   Glucose reference range applies only to samples taken after fasting for at least 8 hours.   Glucose-Capillary 02/02/2022 120 (H)  70 - 99 mg/dL Final   Glucose reference range applies only to samples taken after fasting for at least 8 hours.   Glucose-Capillary 02/02/2022 191 (H)  70 - 99 mg/dL Final   Glucose reference range applies only to samples taken after fasting for at least 8 hours.   Glucose-Capillary 02/03/2022 148 (H)  70 - 99 mg/dL Final   Glucose reference range applies only to samples taken after fasting for at least 8 hours.   Glucose-Capillary 02/03/2022 122 (H)  70 - 99 mg/dL Final   Glucose reference range applies only to samples taken after fasting for at least 8 hours.   Glucose-Capillary 02/03/2022 233 (H)  70 - 99 mg/dL Final   Glucose reference range applies only to samples taken after fasting for at least 8 hours.   Glucose-Capillary 02/04/2022 126 (H)  70 - 99 mg/dL Final   Glucose reference range applies only to samples taken after fasting for at least 8 hours.   Glucose-Capillary 02/04/2022 117  (H)  70 - 99 mg/dL Final   Glucose reference range applies only to samples taken after fasting for at least 8 hours.   Glucose-Capillary 02/04/2022 135 (H)  70 - 99 mg/dL Final   Glucose reference range applies only to samples taken after fasting for at least 8 hours.   Glucose-Capillary 02/04/2022 197 (H)  70 - 99 mg/dL Final   Glucose reference range applies only to samples taken after fasting for at least 8 hours.   Glucose-Capillary 02/05/2022 170 (H)  70 - 99 mg/dL Final   Glucose reference range applies only to samples taken after fasting for at least 8 hours.   Glucose-Capillary 02/05/2022 107 (H)  70 - 99 mg/dL Final   Glucose reference range applies only to samples taken after fasting for at least 8 hours.   Glucose-Capillary 02/05/2022 184 (H)  70 - 99 mg/dL Final   Glucose reference range applies only to samples taken after fasting for at least 8 hours.   Glucose-Capillary 02/05/2022 256 (H)  70 - 99 mg/dL Final  Glucose reference range applies only to samples taken after fasting for at least 8 hours.   Glucose-Capillary 02/06/2022 144 (H)  70 - 99 mg/dL Final   Glucose reference range applies only to samples taken after fasting for at least 8 hours.   Glucose-Capillary 02/06/2022 190 (H)  70 - 99 mg/dL Final   Glucose reference range applies only to samples taken after fasting for at least 8 hours.   Glucose-Capillary 02/06/2022 92  70 - 99 mg/dL Final   Glucose reference range applies only to samples taken after fasting for at least 8 hours.   Glucose-Capillary 02/06/2022 197 (H)  70 - 99 mg/dL Final   Glucose reference range applies only to samples taken after fasting for at least 8 hours.   Glucose-Capillary 02/07/2022 129 (H)  70 - 99 mg/dL Final   Glucose reference range applies only to samples taken after fasting for at least 8 hours.   Glucose-Capillary 02/07/2022 207 (H)  70 - 99 mg/dL Final   Glucose reference range applies only to samples taken after fasting for at  least 8 hours.   Glucose-Capillary 02/07/2022 153 (H)  70 - 99 mg/dL Final   Glucose reference range applies only to samples taken after fasting for at least 8 hours.   Glucose-Capillary 02/08/2022 129 (H)  70 - 99 mg/dL Final   Glucose reference range applies only to samples taken after fasting for at least 8 hours.   Glucose-Capillary 02/08/2022 107 (H)  70 - 99 mg/dL Final   Glucose reference range applies only to samples taken after fasting for at least 8 hours.  Admission on 01/23/2022, Discharged on 01/24/2022  Component Date Value Ref Range Status   Sodium 01/23/2022 140  135 - 145 mmol/L Final   Potassium 01/23/2022 4.4  3.5 - 5.1 mmol/L Final   Chloride 01/23/2022 101  98 - 111 mmol/L Final   CO2 01/23/2022 25  22 - 32 mmol/L Final   Glucose, Bld 01/23/2022 198 (H)  70 - 99 mg/dL Final   Glucose reference range applies only to samples taken after fasting for at least 8 hours.   BUN 01/23/2022 13  6 - 20 mg/dL Final   Creatinine, Ser 01/23/2022 0.62  0.44 - 1.00 mg/dL Final   Calcium 01/23/2022 9.3  8.9 - 10.3 mg/dL Final   GFR, Estimated 01/23/2022 >60  >60 mL/min Final   Comment: (NOTE) Calculated using the CKD-EPI Creatinine Equation (2021)    Anion gap 01/23/2022 14  5 - 15 Final   Performed at [facility], [place] [address], [place], [place] [address]   WBC 01/23/2022 5.8  4.0 - 10.5 K/uL Final   RBC 01/23/2022 4.63  3.87 - 5.11 MIL/uL Final   Hemoglobin 01/23/2022 11.7 (L)  12.0 - 15.0 g/dL Final   HCT 01/23/2022 37.4  36.0 - 46.0 % Final   MCV 01/23/2022 80.8  80.0 - 100.0 fL Final   MCH 01/23/2022 25.3 (L)  26.0 - 34.0 pg Final   MCHC 01/23/2022 31.3  30.0 - 36.0 g/dL Final   RDW 01/23/2022 17.9 (H)  11.5 - 15.5 % Final   Platelets 01/23/2022 333  150 - 400 K/uL Final   nRBC 01/23/2022 0.0  0.0 - 0.2 % Final   Performed at [facility] [address] [place], [place] [address]   Troponin I (High Sensitivity) 01/23/2022 6  <18 ng/L Final   Comment:  (NOTE) Elevated high sensitivity troponin I (hsTnI) values and significant  changes across serial measurements may suggest ACS but many other  chronic and acute conditions are known to elevate hsTnI results.  Refer to the "Links" section for chest pain algorithms and additional  guidance. Performed at [facility], [place] [address] [place], [place] [address]    Troponin I (High Sensitivity) 01/23/2022 5  <18 ng/L Final   Comment: (NOTE) Elevated high sensitivity troponin I (hsTnI) values and significant  changes across serial measurements may suggest ACS but many other  chronic and acute conditions are known to elevate hsTnI results.  Refer to the "Links" section for chest pain algorithms and additional  guidance. Performed at [facility], [place] [address], [place], [place] [address]    SARS Coronavirus 2 by RT PCR 01/23/2022 NEGATIVE  NEGATIVE Final   Comment: (NOTE) SARS-CoV-2 target nucleic acids are NOT DETECTED.  The SARS-CoV-2 RNA is generally detectable in upper and lower respiratory specimens during the acute phase of infection. The lowest concentration of SARS-CoV-2 viral copies this assay can detect is 250 copies / mL. A negative result does not preclude SARS-CoV-2 infection and should not be used as the sole basis for treatment or other patient management decisions.  A negative result may occur with improper specimen collection / handling, submission of specimen other than nasopharyngeal swab, presence of viral mutation(s) within the areas targeted by this assay, and inadequate number of viral copies (<250 copies / mL). A negative result must be combined with clinical observations, patient history, and epidemiological information.  Fact Sheet for Patients:   https://www.patel.info/  Fact Sheet for Healthcare Providers: https://hall.com/  This test is not yet approved or                           cleared by the  [place] FDA and has been authorized for detection and/or diagnosis of SARS-CoV-2 by FDA under an Emergency Use Authorization (EUA).  This EUA will remain in effect (meaning this test can be used) for the duration of the COVID-19 declaration under Section 564(b)(1) of the Act, 21 U.S.C. section 360bbb-3(b)(1), unless the authorization is terminated or revoked sooner.  Performed at [facility], [place] [address] [place], [place] [address]    B Natriuretic Peptide 01/23/2022 41.3  0.0 - 100.0 pg/mL Final   Performed at [facility] [address], [place], [place] [address]   Group A Strep by PCR 01/23/2022 NOT DETECTED  NOT DETECTED Final   Performed at [facility], [place] [address], [place], [place] [address]   Color, Urine 01/23/2022 YELLOW  YELLOW Final   APPearance 01/23/2022 CLEAR  CLEAR Final   Specific Gravity, Urine 01/23/2022 1.018  1.005 - 1.030 Final   pH 01/23/2022 7.0  5.0 - 8.0 Final   Glucose, UA 01/23/2022 NEGATIVE  NEGATIVE mg/dL Final   Hgb urine dipstick 01/23/2022 NEGATIVE  NEGATIVE Final   Bilirubin Urine 01/23/2022 NEGATIVE  NEGATIVE Final   Ketones, ur 01/23/2022 NEGATIVE  NEGATIVE mg/dL Final   Protein, ur 01/23/2022 NEGATIVE  NEGATIVE mg/dL Final   Nitrite 01/23/2022 NEGATIVE  NEGATIVE Final   Leukocytes,Ua 01/23/2022 TRACE (A)  NEGATIVE Final   RBC / HPF 01/23/2022 0-5  0 - 5 RBC/hpf Final   WBC, UA 01/23/2022 0-5  0 - 5 WBC/hpf Final   Bacteria, UA 01/23/2022 NONE SEEN  NONE SEEN Final   Squamous Epithelial / LPF 01/23/2022 0-5  0 - 5 Final  Mucus 01/23/2022 PRESENT   Final   Performed at [facility], [place] [address] [place], [place] [address]   SARS Coronavirus 2 by RT PCR 01/23/2022 NEGATIVE  NEGATIVE Final   Comment: (NOTE) SARS-CoV-2 target nucleic acids are NOT DETECTED.  The SARS-CoV-2 RNA is generally detectable in upper respiratory specimens during the acute phase of infection. The lowest concentration of SARS-CoV-2  viral copies this assay can detect is 138 copies/mL. A negative result does not preclude SARS-Cov-2 infection and should not be used as the sole basis for treatment or other patient management decisions. A negative result may occur with  improper specimen collection/handling, submission of specimen other than nasopharyngeal swab, presence of viral mutation(s) within the areas targeted by this assay, and inadequate number of viral copies(<138 copies/mL). A negative result must be combined with clinical observations, patient history, and epidemiological information. The expected result is Negative.  Fact Sheet for Patients:  EntrepreneurPulse.com.au  Fact Sheet for Healthcare Providers:  IncredibleEmployment.be  This test is no                          t yet approved or cleared by the [place] FDA and  has been authorized for detection and/or diagnosis of SARS-CoV-2 by FDA under an Emergency Use Authorization (EUA). This EUA will remain  in effect (meaning this test can be used) for the duration of the COVID-19 declaration under Section 564(b)(1) of the Act, 21 U.S.C.section 360bbb-3(b)(1), unless the authorization is terminated  or revoked sooner.       Influenza A by PCR 01/23/2022 NEGATIVE  NEGATIVE Final   Influenza B by PCR 01/23/2022 NEGATIVE  NEGATIVE Final   Comment: (NOTE) The Xpert Xpress SARS-CoV-2/FLU/RSV plus assay is intended as an aid in the diagnosis of influenza from Nasopharyngeal swab specimens and should not be used as a sole basis for treatment. Nasal washings and aspirates are unacceptable for Xpert Xpress SARS-CoV-2/FLU/RSV testing.  Fact Sheet for Patients: EntrepreneurPulse.com.au  Fact Sheet for Healthcare Providers: IncredibleEmployment.be  This test is not yet approved or cleared by the [place] FDA and has been authorized for detection and/or diagnosis of SARS-CoV-2  by FDA under an Emergency Use Authorization (EUA). This EUA will remain in effect (meaning this test can be used) for the duration of the COVID-19 declaration under Section 564(b)(1) of the Act, 21 U.S.C. section 360bbb-3(b)(1), unless the authorization is terminated or revoked.  Performed at [facility], [place] [address], [place], [place] [address]    WBC 01/24/2022 6.1  4.0 - 10.5 K/uL Final   RBC 01/24/2022 4.60  3.87 - 5.11 MIL/uL Final   Hemoglobin 01/24/2022 11.7 (L)  12.0 - 15.0 g/dL Final   HCT 01/24/2022 37.0  36.0 - 46.0 % Final   MCV 01/24/2022 80.4  80.0 - 100.0 fL Final   MCH 01/24/2022 25.4 (L)  26.0 - 34.0 pg Final   MCHC 01/24/2022 31.6  30.0 - 36.0 g/dL Final   RDW 01/24/2022 17.6 (H)  11.5 - 15.5 % Final   Platelets 01/24/2022 334  150 - 400 K/uL Final   nRBC 01/24/2022 0.0  0.0 - 0.2 % Final   Neutrophils Relative % 01/24/2022 53  % Final   Neutro Abs 01/24/2022 3.3  1.7 - 7.7 K/uL Final   Lymphocytes Relative 01/24/2022 33  % Final   Lymphs Abs 01/24/2022 2.0  0.7 - 4.0 K/uL Final   Monocytes Relative 01/24/2022 11  %  Final   Monocytes Absolute 01/24/2022 0.7  0.1 - 1.0 K/uL Final   Eosinophils Relative 01/24/2022 2  % Final   Eosinophils Absolute 01/24/2022 0.1  0.0 - 0.5 K/uL Final   Basophils Relative 01/24/2022 1  % Final   Basophils Absolute 01/24/2022 0.1  0.0 - 0.1 K/uL Final   Immature Granulocytes 01/24/2022 0  % Final   Abs Immature Granulocytes 01/24/2022 0.02  0.00 - 0.07 K/uL Final   Performed at [facility], [place] [address] [place], [place] [address]   Glucose-Capillary 01/24/2022 110 (H)  70 - 99 mg/dL Final   Glucose reference range applies only to samples taken after fasting for at least 8 hours.  No results displayed because visit has over 200 results.    Admission on 11/22/2021, Discharged on 11/22/2021  Component Date Value Ref Range Status   Glucose-Capillary 11/22/2021 169 (H)  70 - 99 mg/dL Final   Glucose reference range  applies only to samples taken after fasting for at least 8 hours.  No results displayed because visit has over 200 results.    Admission on 10/04/2021, Discharged on 10/22/2021  Component Date Value Ref Range Status   SARS Coronavirus 2 by RT PCR 10/04/2021 NEGATIVE  NEGATIVE Final   Comment: (NOTE) SARS-CoV-2 target nucleic acids are NOT DETECTED.  The SARS-CoV-2 RNA is generally detectable in upper respiratory specimens during the acute phase of infection. The lowest concentration of SARS-CoV-2 viral copies this assay can detect is 138 copies/mL. A negative result does not preclude SARS-Cov-2 infection and should not be used as the sole basis for treatment or other patient management decisions. A negative result may occur with  improper specimen collection/handling, submission of specimen other than nasopharyngeal swab, presence of viral mutation(s) within the areas targeted by this assay, and inadequate number of viral copies(<138 copies/mL). A negative result must be combined with clinical observations, patient history, and epidemiological information. The expected result is Negative.  Fact Sheet for Patients:  EntrepreneurPulse.com.au  Fact Sheet for Healthcare Providers:  IncredibleEmployment.be  This test is no                          t yet approved or cleared by the [place] FDA and  has been authorized for detection and/or diagnosis of SARS-CoV-2 by FDA under an Emergency Use Authorization (EUA). This EUA will remain  in effect (meaning this test can be used) for the duration of the COVID-19 declaration under Section 564(b)(1) of the Act, 21 U.S.C.section 360bbb-3(b)(1), unless the authorization is terminated  or revoked sooner.       Influenza A by PCR 10/04/2021 NEGATIVE  NEGATIVE Final   Influenza B by PCR 10/04/2021 NEGATIVE  NEGATIVE Final   Comment: (NOTE) The Xpert Xpress SARS-CoV-2/FLU/RSV plus assay is intended as an  aid in the diagnosis of influenza from Nasopharyngeal swab specimens and should not be used as a sole basis for treatment. Nasal washings and aspirates are unacceptable for Xpert Xpress SARS-CoV-2/FLU/RSV testing.  Fact Sheet for Patients: EntrepreneurPulse.com.au  Fact Sheet for Healthcare Providers: IncredibleEmployment.be  This test is not yet approved or cleared by the [place] FDA and has been authorized for detection and/or diagnosis of SARS-CoV-2 by FDA under an Emergency Use Authorization (EUA). This EUA will remain in effect (meaning this test can be used) for the duration of the COVID-19 declaration under Section 564(b)(1) of the Act, 21 U.S.C. section 360bbb-3(b)(1), unless the authorization is terminated  or revoked.  Performed at [facility], [place] [address] [place], [place] [address]    WBC 10/04/2021 8.4  4.0 - 10.5 K/uL Final   RBC 10/04/2021 4.42  3.87 - 5.11 MIL/uL Final   Hemoglobin 10/04/2021 11.6 (L)  12.0 - 15.0 g/dL Final   HCT 10/04/2021 35.7 (L)  36.0 - 46.0 % Final   MCV 10/04/2021 80.8  80.0 - 100.0 fL Final   MCH 10/04/2021 26.2  26.0 - 34.0 pg Final   MCHC 10/04/2021 32.5  30.0 - 36.0 g/dL Final   RDW 10/04/2021 16.0 (H)  11.5 - 15.5 % Final   Platelets 10/04/2021 372  150 - 400 K/uL Final   nRBC 10/04/2021 0.0  0.0 - 0.2 % Final   Neutrophils Relative % 10/04/2021 68  % Final   Neutro Abs 10/04/2021 5.7  1.7 - 7.7 K/uL Final   Lymphocytes Relative 10/04/2021 22  % Final   Lymphs Abs 10/04/2021 1.8  0.7 - 4.0 K/uL Final   Monocytes Relative 10/04/2021 8  % Final   Monocytes Absolute 10/04/2021 0.7  0.1 - 1.0 K/uL Final   Eosinophils Relative 10/04/2021 1  % Final   Eosinophils Absolute 10/04/2021 0.1  0.0 - 0.5 K/uL Final   Basophils Relative 10/04/2021 1  % Final   Basophils Absolute 10/04/2021 0.1  0.0 - 0.1 K/uL Final   Immature Granulocytes 10/04/2021 0  % Final   Abs Immature Granulocytes  10/04/2021 0.03  0.00 - 0.07 K/uL Final   Performed at [facility], [place] [address], [place], [place] [address]   Sodium 10/04/2021 136  135 - 145 mmol/L Final   Potassium 10/04/2021 4.2  3.5 - 5.1 mmol/L Final   Chloride 10/04/2021 104  98 - 111 mmol/L Final   CO2 10/04/2021 25  22 - 32 mmol/L Final   Glucose, Bld 10/04/2021 100 (H)  70 - 99 mg/dL Final   Glucose reference range applies only to samples taken after fasting for at least 8 hours.   BUN 10/04/2021 9  6 - 20 mg/dL Final   Creatinine, Ser 10/04/2021 0.52  0.44 - 1.00 mg/dL Final   Calcium 10/04/2021 9.0  8.9 - 10.3 mg/dL Final   Total Protein 10/04/2021 7.0  6.5 - 8.1 g/dL Final   Albumin 10/04/2021 3.2 (L)  3.5 - 5.0 g/dL Final   AST 10/04/2021 13 (L)  15 - 41 U/L Final   ALT 10/04/2021 10  0 - 44 U/L Final   Alkaline Phosphatase 10/04/2021 61  38 - 126 U/L Final   Total Bilirubin 10/04/2021 0.3  0.3 - 1.2 mg/dL Final   GFR, Estimated 10/04/2021 >60  >60 mL/min Final   Comment: (NOTE) Calculated using the CKD-EPI Creatinine Equation (2021)    Anion gap 10/04/2021 7  5 - 15 Final   Performed at [facility] [address], [place], [place] [address]   Hgb A1c MFr Bld 10/04/2021 7.0 (H)  4.8 - 5.6 % Final   Comment: (NOTE) Pre diabetes:          5.7%-6.4%  Diabetes:              >6.4%  Glycemic control for   <7.0% adults with diabetes    Mean Plasma Glucose 10/04/2021 154.2  mg/dL Final   Performed at [facility], [place] [address], [place], [place] [address]   Cholesterol 10/04/2021 178  0 - 200 mg/dL Final   Triglycerides 10/04/2021 155 (  H)  <150 mg/dL Final   HDL 10/04/2021 52  >40 mg/dL Final   Total CHOL/HDL Ratio 10/04/2021 3.4  RATIO Final   VLDL 10/04/2021 31  0 - 40 mg/dL Final   LDL Cholesterol 10/04/2021 95  0 - 99 mg/dL Final   Comment:        Total Cholesterol/HDL:CHD Risk Coronary Heart Disease Risk Table                     Men   Women  1/2 Average Risk   3.4   3.3  Average Risk        5.0   4.4  2 X Average Risk   9.6   7.1  3 X Average Risk  23.4   11.0        Use the calculated Patient Ratio above and the CHD Risk Table to determine the patient's CHD Risk.        ATP III CLASSIFICATION (LDL):  <100     mg/dL   Optimal  100-129  mg/dL   Near or Above                    Optimal  130-159  mg/dL   Borderline  160-189  mg/dL   High  >190     mg/dL   Very High Performed at [facility] [address] [place], [place] [address]    POC Amphetamine UR 10/04/2021 None Detected  NONE DETECTED (Cut Off Level 1000 ng/mL) Final   POC Secobarbital (BAR) 10/04/2021 None Detected  NONE DETECTED (Cut Off Level 300 ng/mL) Final   POC Buprenorphine (BUP) 10/04/2021 None Detected  NONE DETECTED (Cut Off Level 10 ng/mL) Final   POC Oxazepam (BZO) 10/04/2021 None Detected  NONE DETECTED (Cut Off Level 300 ng/mL) Final   POC Cocaine UR 10/04/2021 None Detected  NONE DETECTED (Cut Off Level 300 ng/mL) Final   POC Methamphetamine UR 10/04/2021 None Detected  NONE DETECTED (Cut Off Level 1000 ng/mL) Final   POC Morphine 10/04/2021 None Detected  NONE DETECTED (Cut Off Level 300 ng/mL) Final   POC Methadone UR 10/04/2021 None Detected  NONE DETECTED (Cut Off Level 300 ng/mL) Final   POC Oxycodone UR 10/04/2021 Positive (A)  NONE DETECTED (Cut Off Level 100 ng/mL) Final   POC Marijuana UR 10/04/2021 None Detected  NONE DETECTED (Cut Off Level 50 ng/mL) Final   SARSCOV2ONAVIRUS 2 AG 10/04/2021 NEGATIVE  NEGATIVE Final   Comment: (NOTE) SARS-CoV-2 antigen NOT DETECTED.   Negative results are presumptive.  Negative results do not preclude SARS-CoV-2 infection and should not be used as the sole basis for treatment or other patient management decisions, including infection  control decisions, particularly in the presence of clinical signs and  symptoms consistent with COVID-19, or in those who have been in contact with the virus.  Negative results must be combined with clinical  observations, patient history, and epidemiological information. The expected result is Negative.  Fact Sheet for Patients: HandmadeRecipes.com.cy  Fact Sheet for Healthcare Providers: FuneralLife.at  This test is not yet approved or cleared by the [place] FDA and  has been authorized for detection and/or diagnosis of SARS-CoV-2 by FDA under an Emergency Use Authorization (EUA).  This EUA will remain in effect (meaning this test can be used) for the duration of  the COV  ID-19 declaration under Section 564(b)(1) of the Act, 21 U.S.C. section 360bbb-3(b)(1), unless the authorization is terminated or revoked sooner.     Valproic Acid Lvl 10/04/2021 51  50.0 - 100.0 ug/mL Final   Performed at [facility] [address] [place], [place] [address]   Valproic Acid Lvl 10/08/2021 57  50.0 - 100.0 ug/mL Final   Performed at [facility] [address], [name], [place] [address]   Glucose-Capillary 10/09/2021 123 (H)  70 - 99 mg/dL Final   Glucose reference range applies only to samples taken after fasting for at least 8 hours.  Admission on 09/09/2021, Discharged on 09/10/2021  Component Date Value Ref Range Status   Sodium 09/09/2021 134 (L)  135 - 145 mmol/L Final   Potassium 09/09/2021 4.3  3.5 - 5.1 mmol/L Final   Chloride 09/09/2021 99  98 - 111 mmol/L Final   CO2 09/09/2021 25  22 - 32 mmol/L Final   Glucose, Bld 09/09/2021 107 (H)  70 - 99 mg/dL Final   Glucose reference range applies only to samples taken after fasting for at least 8 hours.   BUN 09/09/2021 12  6 - 20 mg/dL Final   Creatinine, Ser 09/09/2021 0.74  0.44 - 1.00 mg/dL Final   Calcium 09/09/2021 9.0  8.9 - 10.3 mg/dL Final   Total Protein 09/09/2021 7.4  6.5 - 8.1 g/dL Final   Albumin 09/09/2021 3.1 (L)  3.5 - 5.0 g/dL Final   AST 09/09/2021 13 (L)  15 - 41 U/L Final   ALT 09/09/2021 13  0 - 44 U/L Final   Alkaline  Phosphatase 09/09/2021 66  38 - 126 U/L Final   Total Bilirubin 09/09/2021 0.2 (L)  0.3 - 1.2 mg/dL Final   GFR, Estimated 09/09/2021 >60  >60 mL/min Final   Comment: (NOTE) Calculated using the CKD-EPI Creatinine Equation (2021)    Anion gap 09/09/2021 10  5 - 15 Final   Performed at [facility], [place] [address] [place], [place] [address]   WBC 09/09/2021 8.5  4.0 - 10.5 K/uL Final   RBC 09/09/2021 4.53  3.87 - 5.11 MIL/uL Final   Hemoglobin 09/09/2021 11.8 (L)  12.0 - 15.0 g/dL Final   HCT 09/09/2021 38.0  36.0 - 46.0 % Final   MCV 09/09/2021 83.9  80.0 - 100.0 fL Final   MCH 09/09/2021 26.0  26.0 - 34.0 pg Final   MCHC 09/09/2021 31.1  30.0 - 36.0 g/dL Final   RDW 09/09/2021 17.8 (H)  11.5 - 15.5 % Final   Platelets 09/09/2021 442 (H)  150 - 400 K/uL Final   nRBC 09/09/2021 0.0  0.0 - 0.2 % Final   Neutrophils Relative % 09/09/2021 70  % Final   Neutro Abs 09/09/2021 5.9  1.7 - 7.7 K/uL Final   Lymphocytes Relative 09/09/2021 20  % Final   Lymphs Abs 09/09/2021 1.7  0.7 - 4.0 K/uL Final   Monocytes Relative 09/09/2021 8  % Final   Monocytes Absolute 09/09/2021 0.7  0.1 - 1.0 K/uL Final   Eosinophils Relative 09/09/2021 1  % Final   Eosinophils Absolute 09/09/2021 0.1  0.0 - 0.5 K/uL Final   Basophils Relative 09/09/2021 1  % Final   Basophils Absolute 09/09/2021 0.1  0.0 - 0.1 K/uL Final   Immature Granulocytes 09/09/2021 0  % Final   Abs Immature Granulocytes 09/09/2021 0.03  0.00 - 0.07 K/uL Final   Performed at [facility],  [address] [place], [place] [address]   Ammonia 09/09/2021 37 (H)  9 - 35 umol/L Final   Comment: HEMOLYSIS AT THIS LEVEL MAY AFFECT RESULT Performed at [facility], [place] [address], [place], [place] [address]    Opiates 09/09/2021 NONE DETECTED  NONE DETECTED Final   Cocaine 09/09/2021 NONE DETECTED  NONE DETECTED Final   Benzodiazepines 09/09/2021 NONE DETECTED  NONE DETECTED Final   Amphetamines 09/09/2021 NONE DETECTED  NONE  DETECTED Final   Tetrahydrocannabinol 09/09/2021 NONE DETECTED  NONE DETECTED Final   Barbiturates 09/09/2021 NONE DETECTED  NONE DETECTED Final   Comment: (NOTE) DRUG SCREEN FOR MEDICAL PURPOSES ONLY.  IF CONFIRMATION IS NEEDED FOR ANY PURPOSE, NOTIFY LAB WITHIN 5 DAYS.  LOWEST DETECTABLE LIMITS FOR URINE DRUG SCREEN Drug Class                     Cutoff (ng/mL) Amphetamine and metabolites    1000 Barbiturate and metabolites    200 Benzodiazepine                 673 Tricyclics and metabolites     300 Opiates and metabolites        300 Cocaine and metabolites        300 THC                            50 Performed at [facility], [place] [address] [place], [place] [address]    Alcohol, Ethyl (B) 09/09/2021 <10  <10 mg/dL Final   Comment: (NOTE) Lowest detectable limit for serum alcohol is 10 mg/dL.  For medical purposes only. Performed at [facility], [place] [address], [place], [place] [address]    Lipase 09/09/2021 33  11 - 51 U/L Final   Performed at [facility] [address] [place], [place] [address]   Color, Urine 09/09/2021 COLORLESS (A)  YELLOW Final   APPearance 09/09/2021 CLEAR  CLEAR Final   Specific Gravity, Urine 09/09/2021 1.002 (L)  1.005 - 1.030 Final   pH 09/09/2021 6.0  5.0 - 8.0 Final   Glucose, UA 09/09/2021 NEGATIVE  NEGATIVE mg/dL Final   Hgb urine dipstick 09/09/2021 NEGATIVE  NEGATIVE Final   Bilirubin Urine 09/09/2021 NEGATIVE  NEGATIVE Final   Ketones, ur 09/09/2021 NEGATIVE  NEGATIVE mg/dL Final   Protein, ur 09/09/2021 NEGATIVE  NEGATIVE mg/dL Final   Nitrite 09/09/2021 NEGATIVE  NEGATIVE Final   Leukocytes,Ua 09/09/2021 NEGATIVE  NEGATIVE Final   Performed at [facility] [address], [place], [place] [address]   Specimen Description 09/09/2021 URINE, CLEAN CATCH   Final   Special Requests 09/09/2021 NONE   Final   Culture 09/09/2021  (A)   Final                   Value:<10,000 COLONIES/mL INSIGNIFICANT  GROWTH Performed at [facility], [place] [address], [place], [place] [address]    Report Status 09/09/2021 09/10/2021 FINAL   Final   D-Dimer, Quant 09/09/2021 0.92 (H)  0.00 - 0.50 ug/mL-FEU Final   Comment: (NOTE) At the manufacturer cut-off value of 0.5 g/mL FEU, this assay has a negative predictive value of 95-100%.This assay is intended for use in conjunction with a clinical pretest probability (PTP) assessment model to exclude pulmonary embolism (PE) and deep venous thrombosis (DVT) in outpatients suspected of PE or DVT. Results should be  correlated with clinical presentation. Performed at [facility], [place] [address], [place], [place] [address]    Troponin I (High Sensitivity) 09/09/2021 5  <18 ng/L Final   Comment: (NOTE) Elevated high sensitivity troponin I (hsTnI) values and significant  changes across serial measurements may suggest ACS but many other  chronic and acute conditions are known to elevate hsTnI results.  Refer to the "Links" section for chest pain algorithms and additional  guidance. Performed at [facility], [place] [address], [place], [place] [address]    BP 09/10/2021 135/83  mmHg Final   S' Lateral 09/10/2021 2.30  cm Final   Area-P 1/2 09/10/2021 5.58  cm2 Final    Blood Alcohol level:  Lab Results  Component Value Date   ETH <10 11/23/2021   ETH <10 92/33/0076    Metabolic Disorder Labs: Lab Results  Component Value Date   HGBA1C 6.7 (H) 12/28/2021   MPG 145.59 12/28/2021   MPG 154.2 10/04/2021   Lab Results  Component Value Date   PROLACTIN 3.8 (L) 11/23/2021   Lab Results  Component Value Date   CHOL 171 11/23/2021   TRIG 125 11/23/2021   HDL 65 11/23/2021   CHOLHDL 2.6 11/23/2021   VLDL 25 11/23/2021   LDLCALC 81 11/23/2021   LDLCALC 95 10/04/2021    Therapeutic Lab Levels: No results found for: "LITHIUM" Lab Results  Component Value Date   VALPROATE 33 (L) 01/18/2022   VALPROATE 33 (L) 12/28/2021   No  results found for: "CBMZ"  Physical Findings   PHQ2-9    Flowsheet Row ED [facility]20[facility] in [facility]  PHQ-2 Total Score 2  PHQ-9 Total Score 3      Flowsheet Row ED from [facility]24[facility]20[facility] from 01/23/2022 in [facility] from 11/23/2021 in [facility] No Risk No Risk No Risk        Musculoskeletal  Strength & Muscle Tone: within normal limits Gait & Station: normal Patient leans: N/A  Psychiatric Specialty Exam  Presentation  General Appearance:  Appropriate for Environment  Eye Contact: Good  Speech: Clear and Coherent; Normal Rate  Speech Volume: Normal  Handedness: Right   Mood and Affect  Mood: Euthymic  Affect: Appropriate; Congruent   Thought Process  Thought Processes: Coherent; Goal Directed  Descriptions of Associations:Intact  Orientation:Full (Time, Place and Person)  Thought Content:Logical  Diagnosis of Schizophrenia or Schizoaffective disorder in past: Yes  Duration of Psychotic Symptoms: No data recorded  Hallucinations:Hallucinations: None  Ideas of Reference:None  Suicidal Thoughts:Suicidal Thoughts: No  Homicidal Thoughts:Homicidal Thoughts: No   Sensorium  Memory: Immediate Good; Recent Good  Judgment: Intact  Insight: Present   Executive Functions  Concentration: Fair  Attention Span: Good  Recall: Good  Fund of Knowledge: Good  Language: Good   Psychomotor Activity  Psychomotor Activity: Psychomotor Activity: Normal   Assets  Assets: Communication Skills; Desire for Improvement; Financial Resources/Insurance   Sleep  Sleep: Sleep: Good   Nutritional Assessment (For OBS and FBC admissions only) Has the patient had a weight loss or gain of 10 pounds or more in the last 3 months?: No Has the patient had a decrease in food  intake/or appetite?: No Does the patient have dental problems?: No Does the patient have eating habits or behaviors that may be indicators of an eating disorder including binging or inducing vomiting?: No  Has the patient recently lost weight without trying?: 0 Has the patient been eating poorly because of a decreased appetite?: 0 Malnutrition Screening Tool Score: 0    Physical Exam  Physical Exam Constitutional:      Appearance: Normal appearance.  HENT:     Head: Normocephalic.  Eyes:     Conjunctiva/sclera: Conjunctivae normal.  Cardiovascular:     Rate and Rhythm: Normal rate and regular rhythm.  Pulmonary:     Effort: Pulmonary effort is normal.     Breath sounds: Normal breath sounds.  Abdominal:     General: Abdomen is flat.  Musculoskeletal:        General: Normal range of motion.     Cervical back: Normal range of motion and neck supple.  Skin:    General: Skin is warm and dry.  Neurological:     Mental Status: She is alert and oriented to person, place, and time.  Psychiatric:        Attention and Perception: Attention and perception normal. She does not perceive auditory or visual hallucinations.        Mood and Affect: Mood and affect normal.        Speech: Speech normal.        Behavior: Behavior normal. Behavior is cooperative.        Thought Content: Thought content normal. Thought content is not paranoid or delusional. Thought content does not include homicidal or suicidal ideation.        Cognition and Memory: Cognition and memory normal.        Judgment: Judgment normal.    Review of Systems  Constitutional: Negative.   HENT: Negative.    Eyes: Negative.   Respiratory: Negative.    Cardiovascular: Negative.   Gastrointestinal: Negative.   Skin: Negative.   Neurological: Negative.   Psychiatric/Behavioral:  Positive for depression (Stable). Negative for hallucinations, substance abuse and suicidal ideas. The patient is not nervous/anxious and does  not have insomnia.    Blood pressure (!) 148/96, pulse (!) 116, temperature 98.1 F (36.7 C), temperature source Oral, resp. rate 18, SpO2 97 %. There is no height or weight on file to calculate BMI.  Treatment Plan Summary: Daily contact with patient to assess and evaluate symptoms and progress in treatment and Medication management  Aasir Daigler, NP 02/08/2022 1:19 PM

## 2022-02-09 ENCOUNTER — Ambulatory Visit (HOSPITAL_COMMUNITY)
Admission: EM | Admit: 2022-02-09 | Discharge: 2022-02-17 | Disposition: A | Discharge status: Other Acute Inpatient Hospital | Payer: No Typology Code available for payment source | Attending: Nurse Practitioner | Admitting: Nurse Practitioner

## 2022-02-09 ENCOUNTER — Observation Stay (HOSPITAL_BASED_OUTPATIENT_CLINIC_OR_DEPARTMENT_OTHER): Payer: Medicaid Other

## 2022-02-09 ENCOUNTER — Other Ambulatory Visit (HOSPITAL_COMMUNITY): Payer: Medicaid Other

## 2022-02-09 ENCOUNTER — Other Ambulatory Visit: Payer: Self-pay

## 2022-02-09 DIAGNOSIS — R0789 Other chest pain: Secondary | ICD-10-CM | POA: Diagnosis not present

## 2022-02-09 DIAGNOSIS — Z9189 Other specified personal risk factors, not elsewhere classified: Secondary | ICD-10-CM | POA: Insufficient documentation

## 2022-02-09 DIAGNOSIS — R079 Chest pain, unspecified: Secondary | ICD-10-CM | POA: Diagnosis not present

## 2022-02-09 DIAGNOSIS — E119 Type 2 diabetes mellitus without complications: Secondary | ICD-10-CM

## 2022-02-09 DIAGNOSIS — R000 Tachycardia, unspecified: Secondary | ICD-10-CM

## 2022-02-09 DIAGNOSIS — R4189 Other symptoms and signs involving cognitive functions and awareness: Secondary | ICD-10-CM | POA: Diagnosis present

## 2022-02-09 DIAGNOSIS — I3139 Other pericardial effusion (noninflammatory): Secondary | ICD-10-CM | POA: Diagnosis present

## 2022-02-09 DIAGNOSIS — I480 Paroxysmal atrial fibrillation: Secondary | ICD-10-CM | POA: Diagnosis not present

## 2022-02-09 DIAGNOSIS — F209 Schizophrenia, unspecified: Secondary | ICD-10-CM | POA: Insufficient documentation

## 2022-02-09 DIAGNOSIS — R4183 Borderline intellectual functioning: Secondary | ICD-10-CM | POA: Insufficient documentation

## 2022-02-09 DIAGNOSIS — Z91148 Patient's other noncompliance with medication regimen for other reason: Secondary | ICD-10-CM | POA: Insufficient documentation

## 2022-02-09 DIAGNOSIS — I10 Essential (primary) hypertension: Secondary | ICD-10-CM | POA: Diagnosis present

## 2022-02-09 DIAGNOSIS — Z5900 Homelessness unspecified: Secondary | ICD-10-CM

## 2022-02-09 DIAGNOSIS — R4689 Other symptoms and signs involving appearance and behavior: Secondary | ICD-10-CM | POA: Diagnosis present

## 2022-02-09 LAB — CBC WITH DIFFERENTIAL/PLATELET
Abs Immature Granulocytes: 0.03 10*3/uL (ref 0.00–0.07)
Basophils Absolute: 0.1 10*3/uL (ref 0.0–0.1)
Basophils Relative: 1 %
Eosinophils Absolute: 0.1 10*3/uL (ref 0.0–0.5)
Eosinophils Relative: 1 %
HCT: 33.3 % — ABNORMAL LOW (ref 36.0–46.0)
Hemoglobin: 10.9 g/dL — ABNORMAL LOW (ref 12.0–15.0)
Immature Granulocytes: 0 %
Lymphocytes Relative: 24 %
Lymphs Abs: 1.9 10*3/uL (ref 0.7–4.0)
MCH: 24.9 pg — ABNORMAL LOW (ref 26.0–34.0)
MCHC: 32.7 g/dL (ref 30.0–36.0)
MCV: 76 fL — ABNORMAL LOW (ref 80.0–100.0)
Monocytes Absolute: 0.6 10*3/uL (ref 0.1–1.0)
Monocytes Relative: 8 %
Neutro Abs: 5.4 10*3/uL (ref 1.7–7.7)
Neutrophils Relative %: 66 %
Platelets: 397 10*3/uL (ref 150–400)
RBC: 4.38 MIL/uL (ref 3.87–5.11)
RDW: 16.5 % — ABNORMAL HIGH (ref 11.5–15.5)
WBC: 8.1 10*3/uL (ref 4.0–10.5)
nRBC: 0 % (ref 0.0–0.2)

## 2022-02-09 LAB — CBC
HCT: 36.6 % (ref 36.0–46.0)
Hemoglobin: 11.4 g/dL — ABNORMAL LOW (ref 12.0–15.0)
MCH: 24.3 pg — ABNORMAL LOW (ref 26.0–34.0)
MCHC: 31.1 g/dL (ref 30.0–36.0)
MCV: 77.9 fL — ABNORMAL LOW (ref 80.0–100.0)
Platelets: 403 10*3/uL — ABNORMAL HIGH (ref 150–400)
RBC: 4.7 MIL/uL (ref 3.87–5.11)
RDW: 16.6 % — ABNORMAL HIGH (ref 11.5–15.5)
WBC: 7.3 10*3/uL (ref 4.0–10.5)
nRBC: 0 % (ref 0.0–0.2)

## 2022-02-09 LAB — CBG MONITORING, ED
Glucose-Capillary: 238 mg/dL — ABNORMAL HIGH (ref 70–99)
Glucose-Capillary: 91 mg/dL (ref 70–99)
Glucose-Capillary: 137 mg/dL — ABNORMAL HIGH (ref 70–99)

## 2022-02-09 LAB — ECHOCARDIOGRAM LIMITED
Height: 62 in
Weight: 2846.58 oz

## 2022-02-09 LAB — IRON AND TIBC
Iron: 20 ug/dL — ABNORMAL LOW (ref 28–170)
Saturation Ratios: 4 % — ABNORMAL LOW (ref 10.4–31.8)
TIBC: 455 ug/dL — ABNORMAL HIGH (ref 250–450)
UIBC: 435 ug/dL

## 2022-02-09 LAB — FERRITIN: Ferritin: 2 ng/mL — ABNORMAL LOW (ref 11–307)

## 2022-02-09 LAB — BASIC METABOLIC PANEL
Anion gap: 11 (ref 5–15)
BUN: 9 mg/dL (ref 6–20)
CO2: 24 mmol/L (ref 22–32)
Calcium: 8.3 mg/dL — ABNORMAL LOW (ref 8.9–10.3)
Chloride: 103 mmol/L (ref 98–111)
Creatinine, Ser: 0.44 mg/dL (ref 0.44–1.00)
GFR, Estimated: >60 mL/min (ref >60)
Glucose, Bld: 134 mg/dL — ABNORMAL HIGH (ref 70–99)
Potassium: 3.7 mmol/L (ref 3.5–5.1)
Sodium: 138 mmol/L (ref 135–145)

## 2022-02-09 LAB — CERVICOVAGINAL ANCILLARY ONLY
Bacterial Vaginitis-Urine: NEGATIVE
Comment: NEGATIVE
Trichomonas: NEGATIVE

## 2022-02-09 LAB — GLUCOSE, CAPILLARY: Glucose-Capillary: 118 mg/dL — ABNORMAL HIGH (ref 70–99)

## 2022-02-09 LAB — SEDIMENTATION RATE: Sed Rate: 15 mm/hr (ref 0–22)

## 2022-02-09 LAB — C-REACTIVE PROTEIN: CRP: <0.5 mg/dL (ref <1.0)

## 2022-02-09 MED ORDER — VITAMIN D3 25 MCG (1000 UNIT) PO TABS
50000.0000 [IU] | ORAL_TABLET | ORAL | Status: DC
  Filled 2022-02-09: qty 50

## 2022-02-09 MED ORDER — CLOZAPINE 25 MG PO TABS
25.0000 mg | ORAL_TABLET | Freq: Every day | ORAL | Status: DC
  Filled 2022-02-09: qty 1

## 2022-02-09 MED ORDER — TRAZODONE HCL 50 MG PO TABS
50.0000 mg | ORAL_TABLET | Freq: Every evening | ORAL | Status: DC | PRN
  Administered 2022-02-09 – 2022-02-16 (×5): 50 mg via ORAL
  Filled 2022-02-09 (×5): qty 1

## 2022-02-09 MED ORDER — HALOPERIDOL 5 MG PO TABS: 10.0000 mg | ORAL_TABLET | Freq: Three times a day (TID) | ORAL | Status: DC | PRN

## 2022-02-09 MED ORDER — FERROUS SULFATE 325 (65 FE) MG PO TABS
325.0000 mg | ORAL_TABLET | Freq: Every day | ORAL | Status: DC
  Administered 2022-02-10 – 2022-02-17 (×8): 325 mg via ORAL
  Filled 2022-02-09 (×9): qty 1

## 2022-02-09 MED ORDER — ARIPIPRAZOLE 10 MG PO TABS
10.0000 mg | ORAL_TABLET | Freq: Every day | ORAL | Status: DC
  Administered 2022-02-10 – 2022-02-17 (×8): 10 mg via ORAL
  Filled 2022-02-09 (×8): qty 1

## 2022-02-09 MED ORDER — MAGNESIUM HYDROXIDE 400 MG/5ML PO SUSP: 30.0000 mL | Freq: Every day | ORAL | Status: DC | PRN

## 2022-02-09 MED ORDER — DIVALPROEX SODIUM ER 500 MG PO TB24: 500.0000 mg | ORAL_TABLET | Freq: Two times a day (BID) | ORAL | Status: DC

## 2022-02-09 MED ORDER — COLCHICINE 0.6 MG PO TABS
0.6000 mg | ORAL_TABLET | Freq: Every day | ORAL | Status: DC
  Administered 2022-02-10 – 2022-02-16 (×7): 0.6 mg via ORAL
  Filled 2022-02-09 (×7): qty 1

## 2022-02-09 MED ORDER — DIVALPROEX SODIUM ER 250 MG PO TB24: 250.0000 mg | ORAL_TABLET | Freq: Two times a day (BID) | ORAL | Status: DC

## 2022-02-09 MED ORDER — DIVALPROEX SODIUM ER 500 MG PO TB24
500.0000 mg | ORAL_TABLET | Freq: Two times a day (BID) | ORAL | Status: DC
  Administered 2022-02-10 – 2022-02-17 (×15): 500 mg via ORAL
  Filled 2022-02-09 (×15): qty 1

## 2022-02-09 MED ORDER — MENTHOL 3 MG MT LOZG
1.0000 | LOZENGE | OROMUCOSAL | Status: DC | PRN
  Administered 2022-02-09 – 2022-02-16 (×8): 3 mg via ORAL
  Filled 2022-02-09 (×3): qty 9

## 2022-02-09 MED ORDER — COLCHICINE 0.6 MG PO TABS: 0.6000 mg | ORAL_TABLET | Freq: Every day | ORAL | 0 refills | Status: AC

## 2022-02-09 MED ORDER — CLOZAPINE 25 MG PO TABS: 50.0000 mg | ORAL_TABLET | Freq: Every day | ORAL | Status: DC

## 2022-02-09 MED ORDER — COLCHICINE 0.3 MG HALF TABLET
0.3000 mg | ORAL_TABLET | Freq: Every day | ORAL | Status: DC
  Filled 2022-02-09: qty 1

## 2022-02-09 MED ORDER — CLOZAPINE 25 MG PO TABS
25.0000 mg | ORAL_TABLET | Freq: Every day | ORAL | Status: DC
  Administered 2022-02-09: 25 mg via ORAL
  Filled 2022-02-09: qty 1

## 2022-02-09 MED ORDER — ALUM & MAG HYDROXIDE-SIMETH 200-200-20 MG/5ML PO SUSP
30.0000 mL | ORAL | Status: DC | PRN
  Administered 2022-02-10 – 2022-02-14 (×2): 30 mL via ORAL
  Filled 2022-02-09 (×2): qty 30

## 2022-02-09 MED ORDER — METFORMIN HCL 500 MG PO TABS
1000.0000 mg | ORAL_TABLET | Freq: Two times a day (BID) | ORAL | Status: DC
  Administered 2022-02-09 (×2): 1000 mg via ORAL
  Filled 2022-02-09 (×2): qty 2

## 2022-02-09 MED ORDER — DILTIAZEM HCL ER COATED BEADS 120 MG PO CP24
240.0000 mg | ORAL_CAPSULE | Freq: Every day | ORAL | Status: DC
  Administered 2022-02-10 – 2022-02-17 (×8): 240 mg via ORAL
  Filled 2022-02-09 (×8): qty 2

## 2022-02-09 MED ORDER — INSULIN ASPART 100 UNIT/ML IJ SOLN
0.0000 [IU] | Freq: Three times a day (TID) | INTRAMUSCULAR | Status: DC
  Administered 2022-02-10 (×2): 1 [IU] via SUBCUTANEOUS
  Administered 2022-02-10 – 2022-02-11 (×4): 2 [IU] via SUBCUTANEOUS
  Administered 2022-02-12: 3 [IU] via SUBCUTANEOUS
  Administered 2022-02-12 (×2): 1 [IU] via SUBCUTANEOUS
  Administered 2022-02-13 (×2): 2 [IU] via SUBCUTANEOUS
  Administered 2022-02-13: 1 [IU] via SUBCUTANEOUS
  Administered 2022-02-14 – 2022-02-15 (×4): 2 [IU] via SUBCUTANEOUS
  Administered 2022-02-15 – 2022-02-17 (×5): 1 [IU] via SUBCUTANEOUS

## 2022-02-09 MED ORDER — DILTIAZEM HCL ER COATED BEADS 120 MG PO CP24: 120.0000 mg | ORAL_CAPSULE | Freq: Every day | ORAL | Status: DC

## 2022-02-09 MED ORDER — COLCHICINE 0.6 MG PO TABS: 0.6000 mg | ORAL_TABLET | Freq: Two times a day (BID) | ORAL | Status: DC

## 2022-02-09 MED ORDER — ACETAMINOPHEN 325 MG PO TABS
650.0000 mg | ORAL_TABLET | Freq: Four times a day (QID) | ORAL | Status: DC | PRN
  Administered 2022-02-09 – 2022-02-16 (×7): 650 mg via ORAL
  Filled 2022-02-09 (×8): qty 2

## 2022-02-09 MED ORDER — COLCHICINE 0.6 MG PO TABS
0.6000 mg | ORAL_TABLET | Freq: Every day | ORAL | Status: DC
  Filled 2022-02-09: qty 1

## 2022-02-09 MED ORDER — HYDROXYZINE HCL 25 MG PO TABS
25.0000 mg | ORAL_TABLET | Freq: Three times a day (TID) | ORAL | Status: DC | PRN
  Administered 2022-02-11 – 2022-02-16 (×4): 25 mg via ORAL
  Filled 2022-02-09 (×4): qty 1

## 2022-02-09 MED ORDER — APIXABAN 5 MG PO TABS
5.0000 mg | ORAL_TABLET | Freq: Two times a day (BID) | ORAL | Status: DC
  Administered 2022-02-09 – 2022-02-17 (×16): 5 mg via ORAL
  Filled 2022-02-09 (×17): qty 1

## 2022-02-09 MED ORDER — VALBENAZINE TOSYLATE 40 MG PO CAPS
40.0000 mg | ORAL_CAPSULE | Freq: Every morning | ORAL | Status: DC
  Administered 2022-02-10 – 2022-02-17 (×8): 40 mg via ORAL
  Filled 2022-02-09 (×8): qty 1

## 2022-02-09 MED ORDER — METFORMIN HCL 500 MG PO TABS
500.0000 mg | ORAL_TABLET | Freq: Two times a day (BID) | ORAL | Status: DC
  Administered 2022-02-10 – 2022-02-17 (×15): 500 mg via ORAL
  Filled 2022-02-09 (×15): qty 1

## 2022-02-09 MED ORDER — INSULIN ASPART 100 UNIT/ML IJ SOLN: 0.0000 [IU] | Freq: Every day | INTRAMUSCULAR | Status: DC

## 2022-02-09 NOTE — Progress Notes (Addendum)
Clozapine  Clozapine rems updated with ANC 02/09/22 ANC required weekly  Peggye Fothergill, Pharm D

## 2022-02-09 NOTE — Discharge Summary (Incomplete)
Name: Teresa Murillo MRN: [id number] DOB: [birth year] 60 y.o. PCP: Ileene Hutchinson, PA  Date of Admission: 02/08/2022  3:25 PM Date of Discharge: 02/09/22 Attending Physician: Dr. Angelia Mould  Discharge Diagnosis: Principal Problem:   Atypical chest pain Active Problems:   Pericardial effusion   Tachycardia    Discharge Medications: Allergies as of 02/09/2022       Reactions   Penicillins Other (See Comments)   Reaction not known at group home   Penicillin G Other (See Comments)     Med Rec must be completed prior to using this [facility]***       Disposition and follow-up:   [name] was discharged from [facility] in Stable condition.  At the hospital follow up visit please address:  1.  Follow-up:  a.    b.   c.   d.  2.  Labs / imaging needed at time of follow-up: none  3.  Pending labs/ test needing follow-up: none  4.  Medication Changes No changes  Follow-up Appointments: PCP  Hospital Course by problem list: *** Patient presented to [facility] with complaints of chest pain. Patient was noted to have pregious atrial fibrillation and prior periocardial effusion.  EDP disccussed with cardiology who recommended limited echo to evaluate for pericardial effusion.   Discharge Subjective:   Discharge Exam:   BP (!) 143/80 (BP Location: Right Arm)   Pulse (!) 131   Temp 98 F (36.7 C) (Oral)   Resp 18   Ht 5\' 2"  (1.575 m)   Wt 80.7 kg   SpO2 98%   BMI 32.54 kg/m  Constitutional: well-appearing female sitting in bed, in no acute distress HENT: normocephalic atraumatic, mucous membranes moist Eyes: conjunctiva non-erythematous Neck: supple Cardiovascular: tachycardic rate and rhythm, no m/r/g Pulmonary/Chest: normal work of breathing on room air, lungs clear to auscultation bilaterally Abdominal: soft, non-tender, non-distended MSK: normal bulk and tone Neurological: alert & oriented x 3, 5/5 strength in bilateral upper  and lower extremities, normal gait Skin: warm and dry Psych: ***   Pertinent Labs, Studies, and Procedures:     Latest Ref Rng & Units 02/09/2022    8:42 AM 02/09/2022   12:05 AM 02/08/2022    4:12 PM  CBC  WBC 4.0 - 10.5 K/uL 8.1  7.3  8.5   Hemoglobin 12.0 - 15.0 g/dL 10.9  11.4  10.7   Hematocrit 36.0 - 46.0 % 33.3  36.6  33.1   Platelets 150 - 400 K/uL 397  403  361        Latest Ref Rng & Units 02/09/2022    2:56 AM 02/08/2022    4:12 PM 01/23/2022    2:34 PM  CMP  Glucose 70 - 99 mg/dL 134  126  198   BUN 6 - 20 mg/dL 9  11  13    Creatinine 0.44 - 1.00 mg/dL 0.44  0.52  0.62   Sodium 135 - 145 mmol/L 138  131  140   Potassium 3.5 - 5.1 mmol/L 3.7  4.4  4.4   Chloride 98 - 111 mmol/L 103  96  101   CO2 22 - 32 mmol/L 24  25  25    Calcium 8.9 - 10.3 mg/dL 8.3  9.4  9.3   Total Protein 6.5 - 8.1 g/dL  7.5    Total Bilirubin 0.3 - 1.2 mg/dL  0.1    Alkaline Phos 38 - 126 U/L  67    AST 15 - 41  U/L  22    ALT 0 - 44 U/L  20      DG Chest Portable 1 View  Result Date: 02/08/2022 CLINICAL DATA:  Provided history: Shortness of breath. Additional history provided: Patient transported by ambulance for SVT and chest pain. EXAM: PORTABLE CHEST 1 VIEW COMPARISON:  Prior chest radiographs 01/23/2022 and earlier. Chest CT 01/24/2022. FINDINGS: Enlarged cardiopericardial silhouette, similar to the prior chest radiographs of 01/23/2022. No appreciable airspace consolidation or pulmonary edema. No evidence of pleural effusion or pneumothorax. No acute bony abnormality identified. Dextrocurvature of the upper to mid thoracic spine, possibly positional. IMPRESSION: 1. Enlarged cardiopericardial silhouette, similar to the prior chest radiographs of 01/23/2022. 2. No appreciable airspace consolidation or pulmonary edema. 3. Dextrocurvature of the upper to mid thoracic spine, possibly positional. Electronically Signed   By: Jackey Loge D.O.   On: 02/08/2022 16:05     Discharge  Instructions:   Signed: Adron Bene, MD 02/09/2022, 1:41 PM   Pager: [phone]

## 2022-02-09 NOTE — Progress Notes (Signed)
PHARMACIST - PHYSICIAN ORDER COMMUNICATION  Teresa Murillo is a 61 y.o. year old female with a history of schizophrenia on Clozapine PTA. Continuing this medication order as an inpatient requires that monitoring parameters per REMS requirements must be met.   Clozapine REMS Dispense Authorization was obtained with RDA# [id number]  Last ANC value and date reported on the Clozapine REMS website: 02/02/22  [place] 02/09/2022, 8:28 AM

## 2022-02-09 NOTE — ED Notes (Signed)
Patient discharge with [facility]. Pt alert and oriented x4, walked with stable gait and denied any other needs.

## 2022-02-09 NOTE — ED Notes (Signed)
Report given to [facility] RN Renee.

## 2022-02-09 NOTE — Discharge Instructions (Addendum)
Dear Teresa Murillo,  Thank you for trusting [place] with your care. We treated you in the hospital for chest pain. We feel confident that this pain is not related to your heart or lungs. We did notice that you have some fluid around your heart that has been there for some time. We would like for you to start a medication called colchicine.  We recommend that you continue taking your other prescribed medications as well.   Please follow up with your primary care doctor in 1-2 weeks. Please follow up with the cardiologist.

## 2022-02-09 NOTE — Consult Note (Addendum)
CONSULTATION NOTE   Patient Name: Teresa Murillo Date of Encounter: 02/09/2022 Cardiologist: Berniece Salines, DO Electrophysiologist: None Advanced Heart Failure: None   Chief Complaint   Chest pain  Patient Profile   61 yo female with DM2, COPD, schizoaffective disorder, HTN, and HLD, that has a history of pericardial effusion, seen today for persistent pericardial effusion  HPI   Teresa Murillo is a 61 y.o. female who is being seen today for the evaluation of pericardial effusion at the request of No ref. provider found. 61 yo female patient of Dr. Harriet Masson with recent atrial fibrillation, DM2, COPD, schizoaffective disorder, HTN, and HLD, that has a history of pericardial effusion, seen today for persistent pericardial effusion.  She presented from the behavioral health clinic with chest pain.  She described the pain is nonexertional.  Apparently shortly after arrival her chest pain had resolved.  Cardiac workup in the ER was unremarkable including nonischemic troponins and no acute EKG changes, however she was noted to have a sinus tachycardia.  I was notified about her over the summer when she was found to have a pericardial effusion that was moderate.  There was no tamponade physiology.  Outpatient follow-up was recommended in addition to a repeat echocardiogram, however given her psychiatric history she was noncompliant with this.  She then was seen in September by Dr. Harriet Masson for new onset atrial fibrillation.  She was placed on Eliquis and transitioned to long-acting diltiazem after spontaneous conversion to sinus rhythm.  She had another echocardiogram this admission which showed a persistent moderate pericardial effusion without tamponade physiology that appears largely unchanged from her prior study in July.  She has not had a prior workup as to the etiology of her pericardial effusion and cardiology is asked today to provide that assessment.  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 On the Wechsler Adult Intelligence Scale-4, Ms. Oubre achieved a full-scale IQ score of 73 and a percentile rank of 4 placing her in the borderline range of intellectual functioning.    Chronic obstructive pulmonary disease (COPD) (HCC)    Glaucoma    Hyperlipidemia    Hypertension    Iron deficiency    Schizoaffective disorder (HCC)    Type 2 diabetes mellitus (HCC)     Past Surgical History:  Procedure Laterality Date   TUBAL LIGATION      FAMHx   Family History  Problem Relation Age of Onset   Breast cancer Maternal Grandmother     SOCHx    reports that she quit smoking about 3 months ago. Her smoking use included cigars and cigarettes. She smoked an average of 1 pack per day. She uses smokeless tobacco. She reports that she does not currently use alcohol. She reports that she does not currently use drugs.  Outpatient Medications   No current facility-administered medications on file prior to encounter.   Current Outpatient Medications on File Prior to Encounter  Medication Sig Dispense Refill   Accu-Chek Softclix Lancets lancets Use as directed up to four times daily 100 each 0   apixaban (ELIQUIS) 5 MG TABS tablet Take 1 tablet (5 mg total) by mouth 2 (two) times daily. 60 tablet 0   ARIPiprazole (ABILIFY) 10 MG tablet Take 1 tablet (10 mg total) by mouth daily. 30 tablet 0   Blood Glucose Monitoring Suppl (ACCU-CHEK GUIDE) w/Device KIT Use as directed up to four times  daily 1 kit 0   budesonide-formoterol (SYMBICORT) 160-4.5 MCG/ACT inhaler Inhale 2 puffs into the lungs in the morning and at bedtime.     Cholecalciferol (VITAMIN D3) 1.25 MG (50000 UT) CAPS Take 50,000 Units by mouth every Thursday.     cloZAPine (CLOZARIL) 25 MG tablet Take 3 tablets (75 mg total) by mouth at bedtime. 90 tablet 0   clozapine (CLOZARIL) 50 MG tablet Take 1 tablet (50  mg total) by mouth daily. 30 tablet 0   diltiazem (CARDIZEM CD) 240 MG 24 hr capsule Take 1 capsule (240 mg total) by mouth daily. (Patient not taking: Reported on 11/24/2021) 30 capsule 0   divalproex (DEPAKOTE ER) 500 MG 24 hr tablet Take 1 tablet (500 mg total) by mouth 2 (two) times daily. 60 tablet 0   glucose blood test strip Use as directed up to four times daily 50 each 0   haloperidol (HALDOL) 10 MG tablet Take 1 tablet (10 mg total) by mouth 3 (three) times daily as needed for agitation (and psychotic symptoms).     INGREZZA 40 MG capsule Take 1 capsule (40 mg total) by mouth in the morning. 30 capsule 0   insulin aspart (NOVOLOG) 100 UNIT/ML FlexPen Before each meal 3 times a day, 140-199 - 2 units, 200-250 - 4 units, 251-299 - 6 units,  300-349 - 8 units,  350 or above 10 units. Insulin PEN if approved, provide syringes and needles if needed.Please switch to any approved short acting Insulin if needed. 15 mL 0   insulin glargine (LANTUS) 100 UNIT/ML Solostar Pen Inject 12 Units into the skin daily. 15 mL 0   Insulin Pen Needle 32G X 4 MM MISC Use 4 times a day with insulin, 1 month supply. 100 each 0   latanoprost (XALATAN) 0.005 % ophthalmic solution Place 1 drop into both eyes at bedtime.     metFORMIN (GLUCOPHAGE) 500 MG tablet Take 500 mg by mouth 2 (two) times daily with a meal.     nicotine (NICODERM CQ - DOSED IN MG/24 HR) 7 mg/24hr patch Place 1 patch (7 mg total) onto the skin daily. 28 patch 0   PROAIR HFA 108 (90 Base) MCG/ACT inhaler Inhale 2 puffs into the lungs every 6 (six) hours as needed for wheezing or shortness of breath.      Inpatient Medications    Scheduled Meds:  apixaban  5 mg Oral BID   ARIPiprazole  10 mg Oral Daily   cloZAPine  50 mg Oral Daily   diltiazem  240 mg Oral Daily   divalproex  500 mg Oral BID   fluticasone furoate-vilanterol  1 puff Inhalation Daily   insulin aspart  0-15 Units Subcutaneous TID WC   insulin glargine-yfgn  12 Units  Subcutaneous Daily   metFORMIN  1,000 mg Oral BID WC   nicotine  7 mg Transdermal Daily   pantoprazole  40 mg Oral Daily   valbenazine  40 mg Oral Daily    Continuous Infusions:   PRN Meds: acetaminophen, albuterol, traZODone   ALLERGIES   Allergies  Allergen Reactions   Penicillins Other (See Comments)    Reaction not known at group home   Penicillin G Other (See Comments)    ROS   Pertinent items noted in HPI and remainder of comprehensive ROS otherwise negative.  Vitals   Vitals:   02/09/22 1130 02/09/22 1200 02/09/22 1300 02/09/22 1400  BP:      Pulse:      Resp: 19  18 17 (!) 22  Temp:      TempSrc:      SpO2:      Weight:      Height:       No intake or output data in the 24 hours ending 02/09/22 1621 Filed Weights   02/08/22 1528  Weight: 80.7 kg    Physical Exam   General appearance: alert and no distress Neck: no carotid bruit, no JVD, and thyroid not enlarged, symmetric, no tenderness/mass/nodules Lungs: clear to auscultation bilaterally Heart: regular rate and rhythm, S1, S2 normal, no murmur, click, rub or gallop Abdomen: soft, non-tender; bowel sounds normal; no masses,  no organomegaly Extremities: extremities normal, atraumatic, no cyanosis or edema Pulses: 2+ and symmetric Skin: Skin color, texture, turgor normal. No rashes or lesions Neurologic: Grossly normal Psych: Pleasant  Labs   Results for orders placed or performed during the hospital encounter of 02/08/22 (from the past 48 hour(s))  Brain natriuretic peptide     Status: None   Collection Time: 02/08/22  4:12 PM  Result Value Ref Range   B Natriuretic Peptide 40.2 0.0 - 100.0 pg/mL    Comment: Performed at [facility], [address] [place], [place] [address]  Troponin I (High Sensitivity)     Status: None   Collection Time: 02/08/22  4:12 PM  Result Value Ref Range   Troponin I (High Sensitivity) 4 <18 ng/L    Comment: (NOTE) Elevated high sensitivity troponin I  (hsTnI) values and significant  changes across serial measurements may suggest ACS but many other  chronic and acute conditions are known to elevate hsTnI results.  Refer to the "Links" section for chest pain algorithms and additional  guidance. Performed at [facility], [place] [address] [place], [place] [address]   CBC     Status: Abnormal   Collection Time: 02/08/22  4:12 PM  Result Value Ref Range   WBC 8.5 4.0 - 10.5 K/uL   RBC 4.30 3.87 - 5.11 MIL/uL   Hemoglobin 10.7 (L) 12.0 - 15.0 g/dL   HCT 33.1 (L) 36.0 - 46.0 %   MCV 77.0 (L) 80.0 - 100.0 fL   MCH 24.9 (L) 26.0 - 34.0 pg   MCHC 32.3 30.0 - 36.0 g/dL   RDW 16.5 (H) 11.5 - 15.5 %   Platelets 361 150 - 400 K/uL   nRBC 0.0 0.0 - 0.2 %    Comment: Performed at [facility] [address], [place], [place] [address]  Comprehensive metabolic panel     Status: Abnormal   Collection Time: 02/08/22  4:12 PM  Result Value Ref Range   Sodium 131 (L) 135 - 145 mmol/L   Potassium 4.4 3.5 - 5.1 mmol/L   Chloride 96 (L) 98 - 111 mmol/L   CO2 25 22 - 32 mmol/L   Glucose, Bld 126 (H) 70 - 99 mg/dL    Comment: Glucose reference range applies only to samples taken after fasting for at least 8 hours.   BUN 11 6 - 20 mg/dL   Creatinine, Ser 0.52 0.44 - 1.00 mg/dL   Calcium 9.4 8.9 - 10.3 mg/dL   Total Protein 7.5 6.5 - 8.1 g/dL   Albumin 3.6 3.5 - 5.0 g/dL   AST 22 15 - 41 U/L   ALT 20 0 - 44 U/L   Alkaline Phosphatase 67 38 - 126 U/L   Total Bilirubin 0.1 (L) 0.3 - 1.2 mg/dL   GFR, Estimated >  60 >60 mL/min    Comment: (NOTE) Calculated using the CKD-EPI Creatinine Equation (2021)    Anion gap 10 5 - 15    Comment: Performed at [facility], [place] [address] [place], [place] [address]  Troponin I (High Sensitivity)     Status: None   Collection Time: 02/08/22  6:56 PM  Result Value Ref Range   Troponin I (High Sensitivity) 5 <18 ng/L    Comment: (NOTE) Elevated high sensitivity troponin I (hsTnI) values and  significant  changes across serial measurements may suggest ACS but many other  chronic and acute conditions are known to elevate hsTnI results.  Refer to the "Links" section for chest pain algorithms and additional  guidance. Performed at [facility], [place] [address], [place], [place] [address]   CBG monitoring, ED     Status: Abnormal   Collection Time: 02/08/22 11:33 PM  Result Value Ref Range   Glucose-Capillary 117 (H) 70 - 99 mg/dL    Comment: Glucose reference range applies only to samples taken after fasting for at least 8 hours.  CBC     Status: Abnormal   Collection Time: 02/09/22 12:05 AM  Result Value Ref Range   WBC 7.3 4.0 - 10.5 K/uL   RBC 4.70 3.87 - 5.11 MIL/uL   Hemoglobin 11.4 (L) 12.0 - 15.0 g/dL   HCT 36.6 36.0 - 46.0 %   MCV 77.9 (L) 80.0 - 100.0 fL   MCH 24.3 (L) 26.0 - 34.0 pg   MCHC 31.1 30.0 - 36.0 g/dL   RDW 16.6 (H) 11.5 - 15.5 %   Platelets 403 (H) 150 - 400 K/uL   nRBC 0.0 0.0 - 0.2 %    Comment: Performed at [facility] [address] [place], [place] [address]  Basic metabolic panel     Status: Abnormal   Collection Time: 02/09/22  2:56 AM  Result Value Ref Range   Sodium 138 135 - 145 mmol/L   Potassium 3.7 3.5 - 5.1 mmol/L   Chloride 103 98 - 111 mmol/L   CO2 24 22 - 32 mmol/L   Glucose, Bld 134 (H) 70 - 99 mg/dL    Comment: Glucose reference range applies only to samples taken after fasting for at least 8 hours.   BUN 9 6 - 20 mg/dL   Creatinine, Ser 0.44 0.44 - 1.00 mg/dL   Calcium 8.3 (L) 8.9 - 10.3 mg/dL   GFR, Estimated >60 >60 mL/min    Comment: (NOTE) Calculated using the CKD-EPI Creatinine Equation (2021)    Anion gap 11 5 - 15    Comment: Performed at [facility] [address] [place], [place] [address]  Iron and TIBC     Status: Abnormal   Collection Time: 02/09/22  2:56 AM  Result Value Ref Range   Iron 20 (L) 28 - 170 ug/dL   TIBC 455 (H) 250 - 450 ug/dL   Saturation Ratios 4 (L) 10.4 - 31.8 %    UIBC 435 ug/dL    Comment: Performed at [facility], [place] [address] [place], [place] [address]  Ferritin     Status: Abnormal   Collection Time: 02/09/22  2:56 AM  Result Value Ref Range   Ferritin 2 (L) 11 - 307 ng/mL    Comment: Performed at [facility], [place] [address], [place], [place] [address]  CBG monitoring, ED     Status: Abnormal   Collection  Time: 02/09/22  8:38 AM  Result Value Ref Range   Glucose-Capillary 238 (H) 70 - 99 mg/dL    Comment: Glucose reference range applies only to samples taken after fasting for at least 8 hours.  CBC with Differential/Platelet     Status: Abnormal   Collection Time: 02/09/22  8:42 AM  Result Value Ref Range   WBC 8.1 4.0 - 10.5 K/uL   RBC 4.38 3.87 - 5.11 MIL/uL   Hemoglobin 10.9 (L) 12.0 - 15.0 g/dL   HCT 33.3 (L) 36.0 - 46.0 %   MCV 76.0 (L) 80.0 - 100.0 fL   MCH 24.9 (L) 26.0 - 34.0 pg   MCHC 32.7 30.0 - 36.0 g/dL   RDW 16.5 (H) 11.5 - 15.5 %   Platelets 397 150 - 400 K/uL   nRBC 0.0 0.0 - 0.2 %   Neutrophils Relative % 66 %   Neutro Abs 5.4 1.7 - 7.7 K/uL   Lymphocytes Relative 24 %   Lymphs Abs 1.9 0.7 - 4.0 K/uL   Monocytes Relative 8 %   Monocytes Absolute 0.6 0.1 - 1.0 K/uL   Eosinophils Relative 1 %   Eosinophils Absolute 0.1 0.0 - 0.5 K/uL   Basophils Relative 1 %   Basophils Absolute 0.1 0.0 - 0.1 K/uL   Immature Granulocytes 0 %   Abs Immature Granulocytes 0.03 0.00 - 0.07 K/uL    Comment: Performed at [facility], [address], [place], [place] [address]  CBG monitoring, ED     Status: None   Collection Time: 02/09/22 12:33 PM  Result Value Ref Range   Glucose-Capillary 91 70 - 99 mg/dL    Comment: Glucose reference range applies only to samples taken after fasting for at least 8 hours.    ECG   Sinus tachycardia at 118- Personally Reviewed  Telemetry   Sinus rhythm and sinus tachycardia- Personally Reviewed  Radiology   ECHOCARDIOGRAM LIMITED  Result Date: 02/09/2022     ECHOCARDIOGRAM LIMITED REPORT   Patient Name:   KENIAH KLEMMER Date of Exam: 02/09/2022 Medical Rec #:  [id number]       Height:       62.0 in Accession #:    [id number]      Weight:       177.9 lb Date of Birth:  Dec 11, 1960      BSA:          1.819 m Patient Age:    43 years        BP:           143/80 mmHg Patient Gender: F               HR:           130 bpm. Exam Location:  Inpatient Procedure: Limited Echo, Limited Color Doppler and Cardiac Doppler Indications:    Chest Pain R07.9  History:        Patient has prior history of Echocardiogram examinations.                 Arrythmias:Atrial Flutter, Signs/Symptoms:Chest Pain and                 Shortness of Breath; Risk Factors:Dyslipidemia, Hypertension,                 Diabetes and Current Smoker.  Sonographer:    Greer Pickerel Referring Phys: [id number] ERIK C HOFFMAN  Sonographer Comments: Image acquisition challenging due to patient body habitus, Image acquisition  challenging due to COPD, Image acquisition challenging due to respiratory motion and Image acquisition challenging due to uncooperative patient. IMPRESSIONS  1. Limited study: Technically difficult; patient asked to stop exam before completion.  2. Moderate pericardial effusion. The pericardial effusion is circumferential. There is tachycardia but there is no evidence of mitral valve inflow respirophasic variation. There is no IVC plethora. Though RV and RA are not well visualized; there is not  evidence of increase pericardial pressure in the views obtained.  3. Left ventricular endocardial border not optimally defined to evaluate regional wall motion.  4. The inferior vena cava is normal in size with greater than 50% respiratory variability, suggesting right atrial pressure of 3 mmHg. Comparison(s): Prior images reviewed side by side. Difficult comparison. LV is hyperdynamic in today's study, effusion may be slightly larger but given that patient did not complete either study this is unclear. FINDINGS   Left Ventricle: Left ventricular endocardial border not optimally defined to evaluate regional wall motion. Pericardium: A moderately sized pericardial effusion is present. The pericardial effusion is circumferential. Presence of epicardial fat layer. Venous: The inferior vena cava is normal in size with greater than 50% respiratory variability, suggesting right atrial pressure of 3 mmHg. Additional Comments: Spectral Doppler performed. Color Doppler performed.  Rudean Haskell MD Electronically signed by Rudean Haskell MD Signature Date/Time: 02/09/2022/12:17:50 PM    Final    DG Chest Portable 1 View  Result Date: 02/08/2022 CLINICAL DATA:  Provided history: Shortness of breath. Additional history provided: Patient transported by ambulance for SVT and chest pain. EXAM: PORTABLE CHEST 1 VIEW COMPARISON:  Prior chest radiographs 01/23/2022 and earlier. Chest CT 01/24/2022. FINDINGS: Enlarged cardiopericardial silhouette, similar to the prior chest radiographs of 01/23/2022. No appreciable airspace consolidation or pulmonary edema. No evidence of pleural effusion or pneumothorax. No acute bony abnormality identified. Dextrocurvature of the upper to mid thoracic spine, possibly positional. IMPRESSION: 1. Enlarged cardiopericardial silhouette, similar to the prior chest radiographs of 01/23/2022. 2. No appreciable airspace consolidation or pulmonary edema. 3. Dextrocurvature of the upper to mid thoracic spine, possibly positional. Electronically Signed   By: Kellie Simmering D.O.   On: 02/08/2022 16:05    Cardiac Studies   See echo above  Impression   Principal Problem:   Atypical chest pain Active Problems:   Pericardial effusion   Tachycardia   Recommendation   Ms. Sopher was seen in the emergency department.  She removed all of her EKG leads and monitoring.  She has denied any further chest pains.  She said it was related to smoking earlier today.  She had been noted to be tachycardic  on her monitor with heart rates up into the 150s but appears to be a sinus tachycardia.  Heart rate now at about 112.  No clear atrial fibrillation but she has a history of that.  It is not clear whether she has been compliant with her medications.  I am concerned about compliance going forward.  Her pericardial effusion is likely idiopathic and appears to be stable over the past 6 months.  This was noted prior to her starting on anticoagulation.  As she is asymptomatic with this, would not advocate either diagnostic or therapeutic pericardiocentesis at this time.  Would recommend again outpatient follow-up and repeat imaging.  We could trial anti-inflammatory therapy with colchicine, but would not recommend NSAIDs in the setting of Eliquis anticoagulation.  After reviewing her medication list, colchicine can interact with Ingrezza (p-glycoprotein) and dose reduction is recommended. Therefore, would try  0.6 mg daily at night. Check CRP and sedimentation rate as well as screening ANA with reflex titer if positive.  Would continue home diltiazem CD 240 mg daiy and Eliquis 5 mg BID.  She can follow-up with Dr. Harriet Masson as an outpatient.  Thanks for the consultation. Will sign-off. Call with questions.  [place] will sign off.   Medication Recommendations:  trial of colchicine 0.6 mg QHS x 3-6 months Other recommendations (labs, testing, etc):  Continue diltiazem and Eliquis for afib, recommend repeat outpatient echo in 3 months. Follow up as an outpatient:  Dr. Harriet Masson   Time Spent Directly with Patient:  I have spent a total of 45 minutes with the patient reviewing hospital notes, telemetry, EKGs, labs and examining the patient as well as establishing an assessment and plan that was discussed personally with the patient.  > 50% of time was spent in direct patient care.  Length of Stay:  LOS: 0 days   Pixie Casino, MD, [facility], [place] Director of the  Advanced Lipid Disorders &  Cardiovascular Risk Reduction Clinic Diplomate of the American Board of Clinical Lipidology Attending Cardiologist  Direct Dial: [phone]  Fax: [phone]  Website:  [email or web address] 02/09/2022, 4:21 PM

## 2022-02-09 NOTE — Progress Notes (Signed)
TOC CSW assisted pt with transportation to [facility].  Arman Loy Tarpley-Carter, MSW, LCSW-A Pronouns:  She/Her/Hers [facility] HealthTransitions of Care Clinical Social Worker Direct Number:  [phone] [email or web address]

## 2022-02-09 NOTE — ED Notes (Signed)
Patient up at lib in room and reports that she will be careful while walking around the cords. Patient asked to rest in bed for the night but continues to get up out of the bed. Patient educated on the importance of needing to keep the cardiac leads on but continues to move around and rip them off. Patient reports understanding of  call bell education and verbalizes that she understands how to use the call light when needed. Patient reports no complaints at this time. Call bell is in reach as well as belongings

## 2022-02-09 NOTE — ED Notes (Signed)
Pt sleeping at present, no distress noted.  Monitoring for safety. 

## 2022-02-09 NOTE — ED Provider Notes (Signed)
[facility] Continuous Assessment Admission H&P  Date: 02/09/22 Patient Name: Teresa Murillo MRN: [id number] Chief Complaint: "I don't know, there was nothing wrong". Chief Complaint  Patient presents with   Boarder      Diagnoses:  Final diagnoses:  At risk for self care deficit  Schizophrenia, unspecified type ([place])  Noncompliance with medication regimen  Borderline intellectual functioning    HPI: Teresa Murillo is a 61 year old female with a past psychiatric history significant for schizophrenia, aggressive behavior, and possible intellectual disability who presented to [facility] on 11/23/2021 as a walk-in. At the time, patient had been dismissed from the group home due to multiple elopements. Patient has remained at [facility] while awaiting new placement. Social work and DSS are involved.  Per chart review, Patient transferred to the ED from [facility] 02/08/22 for shortness of breath and chest pain with eating. Pt states she has had a cardiac arrest in the past. Pt Reportedly has heart rate go up to 150s with eating.  Patient has a history of pericardial effusion, and seen at the ED for persistent nonexertional pericardial effusion presenting as chest pain. Apparently shortly after arrival her chest pain had resolved.  Cardiac workup in the ER was unremarkable including nonischemic troponins and no acute EKG changes, however she was noted to have a sinus tachycardia.   Seen face-to-face by this provider and chart reviewed.  Patient denies chest pain or palpitations.  Patient does have a borderline intellectual functioning and somewhat difficult to get history from.   On why she went to the ED, patient reports "I don't know, there was nothing wrong, they sent me to the ED, it was my sugar they were checking and some behavioral problems and I had to sign some papers to come back".  Patient reports "I told them I was nauseous and I was spitting up phlegm in the bathroom and they had to draw  some blood".  On evaluation, patient is alert, oriented x2, and cooperative. Speech is clear. Pt appears appropriate. Eye contact is good. Mood is euthymic, affect is congruent with mood. Thought process and thought content is WDL. Pt denies SI/HI/AVH. There is no indication that the patient is responding to internal stimuli. No delusions elicited during this assessment.    Will be readmitted to the continuous observation unit while admitting awaiting placement. Previous medications restarted, and new recommended medications after ED visit initiated. Patient provided with opportunity for questions.    PHQ 2-9:  [place] ED from 11/23/2021 in [facility]  Thoughts that you would be better off dead, or of hurting yourself in some way Not at all  PHQ-9 Total Score 3       Flowsheet Row ED from 02/08/2022 in [place] ED from 01/24/2022 in [facility] ED from 01/23/2022 in [place] No Risk No Risk No Risk        Total Time spent with patient: 20 minutes  Musculoskeletal  Strength & Muscle Tone: within normal limits Gait & Station: normal Patient leans: N/A  Psychiatric Specialty Exam  Presentation General Appearance:  Appropriate for Environment  Eye Contact: Good  Speech: Normal Rate  Speech Volume: Normal  Handedness: Right   Mood and Affect  Mood: Euthymic  Affect: Appropriate   Thought Process  Thought Processes: Goal Directed  Descriptions of Associations:Intact  Orientation:Full (Time, Place and Person)  Thought Content:WDL  Diagnosis of Schizophrenia  or Schizoaffective disorder in past: Yes  Duration of Psychotic Symptoms: No data recorded Hallucinations:Hallucinations: None  Ideas of Reference:None  Suicidal Thoughts:Suicidal Thoughts: No  Homicidal Thoughts:Homicidal Thoughts:  No   Sensorium  Memory: Immediate Fair  Judgment: Intact  Insight: Present   Executive Functions  Concentration: Fair  Attention Span: Good  Recall: [place] of Knowledge: Fair  Language: Good   Psychomotor Activity  Psychomotor Activity: Psychomotor Activity: Normal   Assets  Assets: Communication Skills; Desire for Improvement; Financial Resources/Insurance   Sleep  Sleep: Sleep: Good   Nutritional Assessment (For OBS and FBC admissions only) Has the patient had a weight loss or gain of 10 pounds or more in the last 3 months?: No Has the patient had a decrease in food intake/or appetite?: No Does the patient have dental problems?: No Does the patient have eating habits or behaviors that may be indicators of an eating disorder including binging or inducing vomiting?: No Has the patient recently lost weight without trying?: 0 Has the patient been eating poorly because of a decreased appetite?: 0 Malnutrition Screening Tool Score: 0    Physical Exam Constitutional:      General: She is not in acute distress.    Appearance: She is not diaphoretic.  HENT:     Head: Normocephalic.     Right Ear: External ear normal.     Left Ear: External ear normal.     Nose: No congestion.  Eyes:     General:        Right eye: No discharge.        Left eye: No discharge.  Cardiovascular:     Rate and Rhythm: Tachycardia present.  Pulmonary:     Effort: No respiratory distress.  Chest:     Chest wall: No tenderness.  Neurological:     Mental Status: She is alert and oriented to person, place, and time.  Psychiatric:        Attention and Perception: Attention and perception normal.        Mood and Affect: Mood and affect normal.        Speech: Speech normal.        Behavior: Behavior is cooperative.        Thought Content: Thought content normal.        Judgment: Judgment normal.    Review of Systems  Constitutional:  Negative for chills,  diaphoresis and fever.  HENT:  Negative for congestion.   Eyes:  Negative for discharge.  Respiratory:  Negative for cough, shortness of breath and wheezing.   Cardiovascular:  Negative for chest pain and palpitations.  Gastrointestinal:  Negative for diarrhea, nausea and vomiting.  Neurological:  Negative for dizziness, loss of consciousness, weakness and headaches.  Psychiatric/Behavioral: Negative.      Blood pressure 129/71, pulse (!) 115, temperature 97.9 F (36.6 C), temperature source Oral, resp. rate 20, SpO2 97 %. There is no height or weight on file to calculate BMI.  Past Psychiatric History: See H & P   Is the patient at risk to self? No  Has the patient been a risk to self in the past 6 months? No .    Has the patient been a risk to self within the distant past? No   Is the patient a risk to others? No   Has the patient been a risk to others in the past 6 months? No   Has the patient been a risk to others within the distant  past? No   Past Medical History:  Past Medical History:  Diagnosis Date   Borderline intellectual functioning 12/14/2021   On 12/14/2021: Appreciate assistance from psychology consult. On the Wechsler Adult Intelligence Scale-4, Ms. Pennino achieved a full-scale IQ score of 73 and a percentile rank of 4 placing her in the borderline range of intellectual functioning.    Chronic obstructive pulmonary disease (COPD) (HCC)    Glaucoma    Hyperlipidemia    Hypertension    Iron deficiency    Schizoaffective disorder (HCC)    Type 2 diabetes mellitus (HCC)     Past Surgical History:  Procedure Laterality Date   TUBAL LIGATION      Family History:  Family History  Problem Relation Age of Onset   Breast cancer Maternal Grandmother     Social History:  Social History   Socioeconomic History   Marital status: Divorced    Spouse name: Not on file   Number of children: Not on file   Years of education: Not on file   Highest education level:  Not on file  Occupational History   Not on file  Tobacco Use   Smoking status: Former    Packs/day: 1.00    Types: Cigars, Cigarettes    Quit date: 11/09/2021    Years since quitting: 0.2   Smokeless tobacco: Current  Vaping Use   Vaping Use: Never used  Substance and Sexual Activity   Alcohol use: Not Currently   Drug use: Not Currently   Sexual activity: Not Currently    Birth control/protection: Surgical  Other Topics Concern   Not on file  Social History Narrative   Not on file   Social Determinants of Health   Financial Resource Strain: Not on file  Food Insecurity: Not on file  Transportation Needs: Not on file  Physical Activity: Not on file  Stress: Not on file  Social Connections: Not on file  Intimate Partner Violence: Not on file    SDOH:  SDOH Screenings   Depression (PHQ2-9): Low Risk  (11/23/2021)  Tobacco Use: High Risk (02/08/2022)    Last Labs:  Admission on 02/09/2022  Component Date Value Ref Range Status   Glucose-Capillary 02/09/2022 118 (H)  70 - 99 mg/dL Final   Glucose reference range applies only to samples taken after fasting for at least 8 hours.  Admission on 02/08/2022, Discharged on 02/09/2022  Component Date Value Ref Range Status   B Natriuretic Peptide 02/08/2022 40.2  0.0 - 100.0 pg/mL Final   Performed at [facility], [address], [place], [place] [address]   Troponin I (High Sensitivity) 02/08/2022 4  <18 ng/L Final   Comment: (NOTE) Elevated high sensitivity troponin I (hsTnI) values and significant  changes across serial measurements may suggest ACS but many other  chronic and acute conditions are known to elevate hsTnI results.  Refer to the "Links" section for chest pain algorithms and additional  guidance. Performed at [facility], [place] [address], [place], [place] [address]    WBC 02/08/2022 8.5  4.0 - 10.5 K/uL Final   RBC 02/08/2022 4.30  3.87 - 5.11 MIL/uL Final   Hemoglobin 02/08/2022 10.7 (L)   12.0 - 15.0 g/dL Final   HCT 02/08/2022 33.1 (L)  36.0 - 46.0 % Final   MCV 02/08/2022 77.0 (L)  80.0 - 100.0 fL Final   MCH 02/08/2022 24.9 (L)  26.0 - 34.0 pg Final   MCHC 02/08/2022 32.3  30.0 -  36.0 g/dL Final   RDW 02/08/2022 16.5 (H)  11.5 - 15.5 % Final   Platelets 02/08/2022 361  150 - 400 K/uL Final   nRBC 02/08/2022 0.0  0.0 - 0.2 % Final   Performed at [facility], [place] [address] [place], [place] [address]   Sodium 02/08/2022 131 (L)  135 - 145 mmol/L Final   Potassium 02/08/2022 4.4  3.5 - 5.1 mmol/L Final   Chloride 02/08/2022 96 (L)  98 - 111 mmol/L Final   CO2 02/08/2022 25  22 - 32 mmol/L Final   Glucose, Bld 02/08/2022 126 (H)  70 - 99 mg/dL Final   Glucose reference range applies only to samples taken after fasting for at least 8 hours.   BUN 02/08/2022 11  6 - 20 mg/dL Final   Creatinine, Ser 02/08/2022 0.52  0.44 - 1.00 mg/dL Final   Calcium 02/08/2022 9.4  8.9 - 10.3 mg/dL Final   Total Protein 02/08/2022 7.5  6.5 - 8.1 g/dL Final   Albumin 02/08/2022 3.6  3.5 - 5.0 g/dL Final   AST 02/08/2022 22  15 - 41 U/L Final   ALT 02/08/2022 20  0 - 44 U/L Final   Alkaline Phosphatase 02/08/2022 67  38 - 126 U/L Final   Total Bilirubin 02/08/2022 0.1 (L)  0.3 - 1.2 mg/dL Final   GFR, Estimated 02/08/2022 >60  >60 mL/min Final   Comment: (NOTE) Calculated using the CKD-EPI Creatinine Equation (2021)    Anion gap 02/08/2022 10  5 - 15 Final   Performed at [facility] [address], [place], [place] [address]   Troponin I (High Sensitivity) 02/08/2022 5  <18 ng/L Final   Comment: (NOTE) Elevated high sensitivity troponin I (hsTnI) values and significant  changes across serial measurements may suggest ACS but many other  chronic and acute conditions are known to elevate hsTnI results.  Refer to the "Links" section for chest pain algorithms and additional  guidance. Performed at [facility], [place] [address] [place], [place] [address]    WBC  02/09/2022 7.3  4.0 - 10.5 K/uL Final   RBC 02/09/2022 4.70  3.87 - 5.11 MIL/uL Final   Hemoglobin 02/09/2022 11.4 (L)  12.0 - 15.0 g/dL Final   HCT 02/09/2022 36.6  36.0 - 46.0 % Final   MCV 02/09/2022 77.9 (L)  80.0 - 100.0 fL Final   MCH 02/09/2022 24.3 (L)  26.0 - 34.0 pg Final   MCHC 02/09/2022 31.1  30.0 - 36.0 g/dL Final   RDW 02/09/2022 16.6 (H)  11.5 - 15.5 % Final   Platelets 02/09/2022 403 (H)  150 - 400 K/uL Final   nRBC 02/09/2022 0.0  0.0 - 0.2 % Final   Performed at [facility] [address], [place], [place] [address]   Glucose-Capillary 02/08/2022 117 (H)  70 - 99 mg/dL Final   Glucose reference range applies only to samples taken after fasting for at least 8 hours.   Sodium 02/09/2022 138  135 - 145 mmol/L Final   Potassium 02/09/2022 3.7  3.5 - 5.1 mmol/L Final   Chloride 02/09/2022 103  98 - 111 mmol/L Final   CO2 02/09/2022 24  22 - 32 mmol/L Final   Glucose, Bld 02/09/2022 134 (H)  70 - 99 mg/dL Final   Glucose reference range applies only to samples taken after fasting for at least 8 hours.   BUN 02/09/2022 9  6 - 20 mg/dL Final  Creatinine, Ser 02/09/2022 0.44  0.44 - 1.00 mg/dL Final   Calcium 02/09/2022 8.3 (L)  8.9 - 10.3 mg/dL Final   GFR, Estimated 02/09/2022 >60  >60 mL/min Final   Comment: (NOTE) Calculated using the CKD-EPI Creatinine Equation (2021)    Anion gap 02/09/2022 11  5 - 15 Final   Performed at [facility], [place] [address] [place], [place] [address]   Iron 02/09/2022 20 (L)  28 - 170 ug/dL Final   TIBC 02/09/2022 455 (H)  250 - 450 ug/dL Final   Saturation Ratios 02/09/2022 4 (L)  10.4 - 31.8 % Final   UIBC 02/09/2022 435  ug/dL Final   Performed at [facility], [place] [address], [place], [place] [address]   Ferritin 02/09/2022 2 (L)  11 - 307 ng/mL Final   Performed at [facility] [address], [place], [place] [address]   Weight 02/09/2022 2,846.58  oz Final   Height 02/09/2022 62  in Final   BP  02/09/2022 143/80  mmHg Final   WBC 02/09/2022 8.1  4.0 - 10.5 K/uL Final   RBC 02/09/2022 4.38  3.87 - 5.11 MIL/uL Final   Hemoglobin 02/09/2022 10.9 (L)  12.0 - 15.0 g/dL Final   HCT 02/09/2022 33.3 (L)  36.0 - 46.0 % Final   MCV 02/09/2022 76.0 (L)  80.0 - 100.0 fL Final   MCH 02/09/2022 24.9 (L)  26.0 - 34.0 pg Final   MCHC 02/09/2022 32.7  30.0 - 36.0 g/dL Final   RDW 02/09/2022 16.5 (H)  11.5 - 15.5 % Final   Platelets 02/09/2022 397  150 - 400 K/uL Final   nRBC 02/09/2022 0.0  0.0 - 0.2 % Final   Neutrophils Relative % 02/09/2022 66  % Final   Neutro Abs 02/09/2022 5.4  1.7 - 7.7 K/uL Final   Lymphocytes Relative 02/09/2022 24  % Final   Lymphs Abs 02/09/2022 1.9  0.7 - 4.0 K/uL Final   Monocytes Relative 02/09/2022 8  % Final   Monocytes Absolute 02/09/2022 0.6  0.1 - 1.0 K/uL Final   Eosinophils Relative 02/09/2022 1  % Final   Eosinophils Absolute 02/09/2022 0.1  0.0 - 0.5 K/uL Final   Basophils Relative 02/09/2022 1  % Final   Basophils Absolute 02/09/2022 0.1  0.0 - 0.1 K/uL Final   Immature Granulocytes 02/09/2022 0  % Final   Abs Immature Granulocytes 02/09/2022 0.03  0.00 - 0.07 K/uL Final   Performed at [facility], [place] [address] [place], [place] [address]   Glucose-Capillary 02/09/2022 238 (H)  70 - 99 mg/dL Final   Glucose reference range applies only to samples taken after fasting for at least 8 hours.   Glucose-Capillary 02/09/2022 91  70 - 99 mg/dL Final   Glucose reference range applies only to samples taken after fasting for at least 8 hours.   CRP 02/09/2022 <0.5  <1.0 mg/dL Final   Performed at [facility] [address] [place], [place] [address]   Sed Rate 02/09/2022 15  0 - 22 mm/hr Final   Performed at [facility] [address] [place],  [place] [address]   Glucose-Capillary 02/09/2022 137 (H)  70 - 99 mg/dL Final   Glucose reference range applies only to samples taken after fasting for at least 8 hours.  Admission on  01/24/2022, Discharged on 02/08/2022  Component Date Value Ref Range Status   Glucose-Capillary 01/24/2022 143 (H)  70 -  99 mg/dL Final   Glucose reference range applies only to samples taken after fasting for at least 8 hours.   Glucose-Capillary 01/24/2022 120 (H)  70 - 99 mg/dL Final   Glucose reference range applies only to samples taken after fasting for at least 8 hours.   Glucose-Capillary 01/25/2022 122 (H)  70 - 99 mg/dL Final   Glucose reference range applies only to samples taken after fasting for at least 8 hours.   Glucose-Capillary 01/25/2022 190 (H)  70 - 99 mg/dL Final   Glucose reference range applies only to samples taken after fasting for at least 8 hours.   Glucose-Capillary 01/25/2022 163 (H)  70 - 99 mg/dL Final   Glucose reference range applies only to samples taken after fasting for at least 8 hours.   WBC 01/26/2022 6.5  4.0 - 10.5 K/uL Final   RBC 01/26/2022 4.53  3.87 - 5.11 MIL/uL Final   Hemoglobin 01/26/2022 11.3 (L)  12.0 - 15.0 g/dL Final   HCT 01/26/2022 36.4  36.0 - 46.0 % Final   MCV 01/26/2022 80.4  80.0 - 100.0 fL Final   MCH 01/26/2022 24.9 (L)  26.0 - 34.0 pg Final   MCHC 01/26/2022 31.0  30.0 - 36.0 g/dL Final   RDW 01/26/2022 17.3 (H)  11.5 - 15.5 % Final   Platelets 01/26/2022 327  150 - 400 K/uL Final   nRBC 01/26/2022 0.0  0.0 - 0.2 % Final   Neutrophils Relative % 01/26/2022 60  % Final   Neutro Abs 01/26/2022 3.9  1.7 - 7.7 K/uL Final   Lymphocytes Relative 01/26/2022 31  % Final   Lymphs Abs 01/26/2022 2.0  0.7 - 4.0 K/uL Final   Monocytes Relative 01/26/2022 7  % Final   Monocytes Absolute 01/26/2022 0.5  0.1 - 1.0 K/uL Final   Eosinophils Relative 01/26/2022 1  % Final   Eosinophils Absolute 01/26/2022 0.1  0.0 - 0.5 K/uL Final   Basophils Relative 01/26/2022 1  % Final   Basophils Absolute 01/26/2022 0.1  0.0 - 0.1 K/uL Final   Immature Granulocytes 01/26/2022 0  % Final   Abs Immature Granulocytes 01/26/2022 0.02  0.00 - 0.07 K/uL  Final   Performed at [facility], [place] [address], [place], [place] [address]   Glucose-Capillary 01/26/2022 149 (H)  70 - 99 mg/dL Final   Glucose reference range applies only to samples taken after fasting for at least 8 hours.   Glucose-Capillary 01/26/2022 130 (H)  70 - 99 mg/dL Final   Glucose reference range applies only to samples taken after fasting for at least 8 hours.   Glucose-Capillary 01/26/2022 151 (H)  70 - 99 mg/dL Final   Glucose reference range applies only to samples taken after fasting for at least 8 hours.   Glucose-Capillary 01/26/2022 170 (H)  70 - 99 mg/dL Final   Glucose reference range applies only to samples taken after fasting for at least 8 hours.   Glucose-Capillary 01/27/2022 129 (H)  70 - 99 mg/dL Final   Glucose reference range applies only to samples taken after fasting for at least 8 hours.   Glucose-Capillary 01/27/2022 101 (H)  70 - 99 mg/dL Final   Glucose reference range applies only to samples taken after fasting for at least 8 hours.   Glucose-Capillary 01/27/2022 220 (H)  70 - 99 mg/dL Final   Glucose reference range applies only to samples taken after fasting for at least 8 hours.   Glucose-Capillary 01/27/2022  154 (H)  70 - 99 mg/dL Final   Glucose reference range applies only to samples taken after fasting for at least 8 hours.   Glucose-Capillary 01/27/2022 163 (H)  70 - 99 mg/dL Final   Glucose reference range applies only to samples taken after fasting for at least 8 hours.   Glucose-Capillary 01/28/2022 127 (H)  70 - 99 mg/dL Final   Glucose reference range applies only to samples taken after fasting for at least 8 hours.   Glucose-Capillary 01/28/2022 110 (H)  70 - 99 mg/dL Final   Glucose reference range applies only to samples taken after fasting for at least 8 hours.   Glucose-Capillary 01/28/2022 167 (H)  70 - 99 mg/dL Final   Glucose reference range applies only to samples taken after fasting for at least 8 hours.    Glucose-Capillary 01/29/2022 145 (H)  70 - 99 mg/dL Final   Glucose reference range applies only to samples taken after fasting for at least 8 hours.   Glucose-Capillary 01/29/2022 203 (H)  70 - 99 mg/dL Final   Glucose reference range applies only to samples taken after fasting for at least 8 hours.   Glucose-Capillary 01/29/2022 153 (H)  70 - 99 mg/dL Final   Glucose reference range applies only to samples taken after fasting for at least 8 hours.   Glucose-Capillary 01/29/2022 166 (H)  70 - 99 mg/dL Final   Glucose reference range applies only to samples taken after fasting for at least 8 hours.   Glucose-Capillary 01/30/2022 142 (H)  70 - 99 mg/dL Final   Glucose reference range applies only to samples taken after fasting for at least 8 hours.   Glucose-Capillary 01/30/2022 138 (H)  70 - 99 mg/dL Final   Glucose reference range applies only to samples taken after fasting for at least 8 hours.   Glucose-Capillary 01/30/2022 185 (H)  70 - 99 mg/dL Final   Glucose reference range applies only to samples taken after fasting for at least 8 hours.   Glucose-Capillary 01/30/2022 220 (H)  70 - 99 mg/dL Final   Glucose reference range applies only to samples taken after fasting for at least 8 hours.   Glucose-Capillary 01/31/2022 147 (H)  70 - 99 mg/dL Final   Glucose reference range applies only to samples taken after fasting for at least 8 hours.   Glucose-Capillary 01/31/2022 131 (H)  70 - 99 mg/dL Final   Glucose reference range applies only to samples taken after fasting for at least 8 hours.   Glucose-Capillary 01/31/2022 169 (H)  70 - 99 mg/dL Final   Glucose reference range applies only to samples taken after fasting for at least 8 hours.   Glucose-Capillary 01/31/2022 144 (H)  70 - 99 mg/dL Final   Glucose reference range applies only to samples taken after fasting for at least 8 hours.   Comment 1 01/31/2022 RN AWARE   Final   Glucose-Capillary 02/01/2022 117 (H)  70 - 99 mg/dL Final    Glucose reference range applies only to samples taken after fasting for at least 8 hours.   Glucose-Capillary 02/01/2022 180 (H)  70 - 99 mg/dL Final   Glucose reference range applies only to samples taken after fasting for at least 8 hours.   Glucose-Capillary 02/01/2022 209 (H)  70 - 99 mg/dL Final   Glucose reference range applies only to samples taken after fasting for at least 8 hours.   Glucose-Capillary 02/02/2022 131 (H)  70 - 99 mg/dL Final  Glucose reference range applies only to samples taken after fasting for at least 8 hours.   WBC 02/02/2022 7.5  4.0 - 10.5 K/uL Final   RBC 02/02/2022 4.52  3.87 - 5.11 MIL/uL Final   Hemoglobin 02/02/2022 11.3 (L)  12.0 - 15.0 g/dL Final   HCT 02/02/2022 35.0 (L)  36.0 - 46.0 % Final   MCV 02/02/2022 77.4 (L)  80.0 - 100.0 fL Final   MCH 02/02/2022 25.0 (L)  26.0 - 34.0 pg Final   MCHC 02/02/2022 32.3  30.0 - 36.0 g/dL Final   RDW 02/02/2022 16.7 (H)  11.5 - 15.5 % Final   Platelets 02/02/2022 345  150 - 400 K/uL Final   nRBC 02/02/2022 0.0  0.0 - 0.2 % Final   Neutrophils Relative % 02/02/2022 63  % Final   Neutro Abs 02/02/2022 4.7  1.7 - 7.7 K/uL Final   Lymphocytes Relative 02/02/2022 28  % Final   Lymphs Abs 02/02/2022 2.1  0.7 - 4.0 K/uL Final   Monocytes Relative 02/02/2022 7  % Final   Monocytes Absolute 02/02/2022 0.5  0.1 - 1.0 K/uL Final   Eosinophils Relative 02/02/2022 1  % Final   Eosinophils Absolute 02/02/2022 0.1  0.0 - 0.5 K/uL Final   Basophils Relative 02/02/2022 1  % Final   Basophils Absolute 02/02/2022 0.1  0.0 - 0.1 K/uL Final   Immature Granulocytes 02/02/2022 0  % Final   Abs Immature Granulocytes 02/02/2022 0.03  0.00 - 0.07 K/uL Final   Performed at [facility], [place] [address] [place], [place] [address]   Glucose-Capillary 02/02/2022 95  70 - 99 mg/dL Final   Glucose reference range applies only to samples taken after fasting for at least 8 hours.   Glucose-Capillary 02/02/2022 120 (H)  70 - 99 mg/dL  Final   Glucose reference range applies only to samples taken after fasting for at least 8 hours.   Glucose-Capillary 02/02/2022 191 (H)  70 - 99 mg/dL Final   Glucose reference range applies only to samples taken after fasting for at least 8 hours.   Glucose-Capillary 02/03/2022 148 (H)  70 - 99 mg/dL Final   Glucose reference range applies only to samples taken after fasting for at least 8 hours.   Glucose-Capillary 02/03/2022 122 (H)  70 - 99 mg/dL Final   Glucose reference range applies only to samples taken after fasting for at least 8 hours.   Glucose-Capillary 02/03/2022 233 (H)  70 - 99 mg/dL Final   Glucose reference range applies only to samples taken after fasting for at least 8 hours.   Glucose-Capillary 02/04/2022 126 (H)  70 - 99 mg/dL Final   Glucose reference range applies only to samples taken after fasting for at least 8 hours.   Glucose-Capillary 02/04/2022 117 (H)  70 - 99 mg/dL Final   Glucose reference range applies only to samples taken after fasting for at least 8 hours.   Glucose-Capillary 02/04/2022 135 (H)  70 - 99 mg/dL Final   Glucose reference range applies only to samples taken after fasting for at least 8 hours.   Glucose-Capillary 02/04/2022 197 (H)  70 - 99 mg/dL Final   Glucose reference range applies only to samples taken after fasting for at least 8 hours.   Glucose-Capillary 02/05/2022 170 (H)  70 - 99 mg/dL Final   Glucose reference range applies only to samples taken after fasting for at least 8 hours.   Glucose-Capillary 02/05/2022 107 (H)  70 -  99 mg/dL Final   Glucose reference range applies only to samples taken after fasting for at least 8 hours.   Glucose-Capillary 02/05/2022 184 (H)  70 - 99 mg/dL Final   Glucose reference range applies only to samples taken after fasting for at least 8 hours.   Glucose-Capillary 02/05/2022 256 (H)  70 - 99 mg/dL Final   Glucose reference range applies only to samples taken after fasting for at least 8 hours.    Glucose-Capillary 02/06/2022 144 (H)  70 - 99 mg/dL Final   Glucose reference range applies only to samples taken after fasting for at least 8 hours.   Glucose-Capillary 02/06/2022 190 (H)  70 - 99 mg/dL Final   Glucose reference range applies only to samples taken after fasting for at least 8 hours.   Glucose-Capillary 02/06/2022 92  70 - 99 mg/dL Final   Glucose reference range applies only to samples taken after fasting for at least 8 hours.   Trichomonas 02/06/2022 Negative   Final   Bacterial Vaginitis-Urine 02/06/2022 Negative   Final   Molecular Comment 02/06/2022 For tests bacteria and/or candida, this specimen does not meet the   Final   Molecular Comment 02/06/2022 strict criteria set by the FDA. The result interpretation should be   Final   Molecular Comment 02/06/2022 considered in conjunction with the patient's clinical history.   Final   Comment 02/06/2022 Normal Reference Range Trichomonas - Negative   Final   Glucose-Capillary 02/06/2022 197 (H)  70 - 99 mg/dL Final   Glucose reference range applies only to samples taken after fasting for at least 8 hours.   Glucose-Capillary 02/07/2022 129 (H)  70 - 99 mg/dL Final   Glucose reference range applies only to samples taken after fasting for at least 8 hours.   Glucose-Capillary 02/07/2022 207 (H)  70 - 99 mg/dL Final   Glucose reference range applies only to samples taken after fasting for at least 8 hours.   Glucose-Capillary 02/07/2022 153 (H)  70 - 99 mg/dL Final   Glucose reference range applies only to samples taken after fasting for at least 8 hours.   Glucose-Capillary 02/08/2022 129 (H)  70 - 99 mg/dL Final   Glucose reference range applies only to samples taken after fasting for at least 8 hours.   Glucose-Capillary 02/08/2022 107 (H)  70 - 99 mg/dL Final   Glucose reference range applies only to samples taken after fasting for at least 8 hours.  Admission on 01/23/2022, Discharged on 01/24/2022  Component Date Value  Ref Range Status   Sodium 01/23/2022 140  135 - 145 mmol/L Final   Potassium 01/23/2022 4.4  3.5 - 5.1 mmol/L Final   Chloride 01/23/2022 101  98 - 111 mmol/L Final   CO2 01/23/2022 25  22 - 32 mmol/L Final   Glucose, Bld 01/23/2022 198 (H)  70 - 99 mg/dL Final   Glucose reference range applies only to samples taken after fasting for at least 8 hours.   BUN 01/23/2022 13  6 - 20 mg/dL Final   Creatinine, Ser 01/23/2022 0.62  0.44 - 1.00 mg/dL Final   Calcium 01/23/2022 9.3  8.9 - 10.3 mg/dL Final   GFR, Estimated 01/23/2022 >60  >60 mL/min Final   Comment: (NOTE) Calculated using the CKD-EPI Creatinine Equation (2021)    Anion gap 01/23/2022 14  5 - 15 Final   Performed at [facility], [place] [address] [place], [place] [address]   WBC 01/23/2022 5.8  4.0 - 10.5 K/uL Final   RBC 01/23/2022 4.63  3.87 - 5.11 MIL/uL Final   Hemoglobin 01/23/2022 11.7 (L)  12.0 - 15.0 g/dL Final   HCT 01/23/2022 37.4  36.0 - 46.0 % Final   MCV 01/23/2022 80.8  80.0 - 100.0 fL Final   MCH 01/23/2022 25.3 (L)  26.0 - 34.0 pg Final   MCHC 01/23/2022 31.3  30.0 - 36.0 g/dL Final   RDW 01/23/2022 17.9 (H)  11.5 - 15.5 % Final   Platelets 01/23/2022 333  150 - 400 K/uL Final   nRBC 01/23/2022 0.0  0.0 - 0.2 % Final   Performed at [place] [address], [place], [place] [address]   Troponin I (High Sensitivity) 01/23/2022 6  <18 ng/L Final   Comment: (NOTE) Elevated high sensitivity troponin I (hsTnI) values and significant  changes across serial measurements may suggest ACS but many other  chronic and acute conditions are known to elevate hsTnI results.  Refer to the "Links" section for chest pain algorithms and additional  guidance. Performed at [facility], [place] [address] [place], [place] [address]    Troponin I (High Sensitivity) 01/23/2022 5  <18 ng/L Final   Comment: (NOTE) Elevated high sensitivity troponin I (hsTnI) values and significant  changes across serial  measurements may suggest ACS but many other  chronic and acute conditions are known to elevate hsTnI results.  Refer to the "Links" section for chest pain algorithms and additional  guidance. Performed at [facility], [place] [address], [place], [place] [address]    SARS Coronavirus 2 by RT PCR 01/23/2022 NEGATIVE  NEGATIVE Final   Comment: (NOTE) SARS-CoV-2 target nucleic acids are NOT DETECTED.  The SARS-CoV-2 RNA is generally detectable in upper and lower respiratory specimens during the acute phase of infection. The lowest concentration of SARS-CoV-2 viral copies this assay can detect is 250 copies / mL. A negative result does not preclude SARS-CoV-2 infection and should not be used as the sole basis for treatment or other patient management decisions.  A negative result may occur with improper specimen collection / handling, submission of specimen other than nasopharyngeal swab, presence of viral mutation(s) within the areas targeted by this assay, and inadequate number of viral copies (<250 copies / mL). A negative result must be combined with clinical observations, patient history, and epidemiological information.  Fact Sheet for Patients:   https://www.patel.info/  Fact Sheet for Healthcare Providers: https://hall.com/  This test is not yet approved or                           cleared by the [place] FDA and has been authorized for detection and/or diagnosis of SARS-CoV-2 by FDA under an Emergency Use Authorization (EUA).  This EUA will remain in effect (meaning this test can be used) for the duration of the COVID-19 declaration under Section 564(b)(1) of the Act, 21 U.S.C. section 360bbb-3(b)(1), unless the authorization is terminated or revoked sooner.  Performed at [facility], [place] [address], [place], [place] [address]    B Natriuretic Peptide 01/23/2022 41.3  0.0 - 100.0 pg/mL Final   Performed at [place] [address] [place], [place] [address]   Group A Strep by PCR 01/23/2022 NOT DETECTED  NOT DETECTED Final   Performed at [facility], [place] [address], [place], [place] [address]   Color, Urine 01/23/2022 YELLOW  YELLOW Final   APPearance 01/23/2022 CLEAR  CLEAR Final   Specific Gravity, Urine 01/23/2022 1.018  1.005 - 1.030 Final   pH 01/23/2022 7.0  5.0 - 8.0 Final   Glucose, UA 01/23/2022 NEGATIVE  NEGATIVE mg/dL Final   Hgb urine dipstick 01/23/2022 NEGATIVE  NEGATIVE Final   Bilirubin Urine 01/23/2022 NEGATIVE  NEGATIVE Final   Ketones, ur 01/23/2022 NEGATIVE  NEGATIVE mg/dL Final   Protein, ur 01/23/2022 NEGATIVE  NEGATIVE mg/dL Final   Nitrite 01/23/2022 NEGATIVE  NEGATIVE Final   Leukocytes,Ua 01/23/2022 TRACE (A)  NEGATIVE Final   RBC / HPF 01/23/2022 0-5  0 - 5 RBC/hpf Final   WBC, UA 01/23/2022 0-5  0 - 5 WBC/hpf Final   Bacteria, UA 01/23/2022 NONE SEEN  NONE SEEN Final   Squamous Epithelial / LPF 01/23/2022 0-5  0 - 5 Final   Mucus 01/23/2022 PRESENT   Final   Performed at [facility], [place] [address] [place], [place] [address]   SARS Coronavirus 2 by RT PCR 01/23/2022 NEGATIVE  NEGATIVE Final   Comment: (NOTE) SARS-CoV-2 target nucleic acids are NOT DETECTED.  The SARS-CoV-2 RNA is generally detectable in upper respiratory specimens during the acute phase of infection. The lowest concentration of SARS-CoV-2 viral copies this assay can detect is 138 copies/mL. A negative result does not preclude SARS-Cov-2 infection and should not be used as the sole basis for treatment or other patient management decisions. A negative result may occur with  improper specimen collection/handling, submission of specimen other than nasopharyngeal swab, presence of viral mutation(s) within the areas targeted by this assay, and inadequate number of viral copies(<138 copies/mL). A negative result must be combined with clinical observations, patient history, and  epidemiological information. The expected result is Negative.  Fact Sheet for Patients:  EntrepreneurPulse.com.au  Fact Sheet for Healthcare Providers:  IncredibleEmployment.be  This test is no                          t yet approved or cleared by the [place] FDA and  has been authorized for detection and/or diagnosis of SARS-CoV-2 by FDA under an Emergency Use Authorization (EUA). This EUA will remain  in effect (meaning this test can be used) for the duration of the COVID-19 declaration under Section 564(b)(1) of the Act, 21 U.S.C.section 360bbb-3(b)(1), unless the authorization is terminated  or revoked sooner.       Influenza A by PCR 01/23/2022 NEGATIVE  NEGATIVE Final   Influenza B by PCR 01/23/2022 NEGATIVE  NEGATIVE Final   Comment: (NOTE) The Xpert Xpress SARS-CoV-2/FLU/RSV plus assay is intended as an aid in the diagnosis of influenza from Nasopharyngeal swab specimens and should not be used as a sole basis for treatment. Nasal washings and aspirates are unacceptable for Xpert Xpress SARS-CoV-2/FLU/RSV testing.  Fact Sheet for Patients: EntrepreneurPulse.com.au  Fact Sheet for Healthcare Providers: IncredibleEmployment.be  This test is not yet approved or cleared by the [place] FDA and has been authorized for detection and/or diagnosis of SARS-CoV-2 by FDA under an Emergency Use Authorization (EUA). This EUA will remain in effect (meaning this test can be used) for the duration of the COVID-19 declaration under Section 564(b)(1) of the Act, 21 U.S.C. section 360bbb-3(b)(1), unless the authorization is terminated or revoked.  Performed at [facility], [place] [address] [place], [place] [address]    WBC 01/24/2022 6.1  4.0 - 10.5 K/uL Final   RBC 01/24/2022 4.60  3.87 - 5.11 MIL/uL Final   Hemoglobin 01/24/2022 11.7 (L)  12.0 - 15.0 g/dL Final   HCT 01/24/2022 37.0  36.0 -  46.0 % Final   MCV 01/24/2022 80.4  80.0 - 100.0 fL Final   MCH 01/24/2022 25.4 (L)  26.0 - 34.0 pg Final   MCHC 01/24/2022 31.6  30.0 - 36.0 g/dL Final   RDW 01/24/2022 17.6 (H)  11.5 - 15.5 % Final   Platelets 01/24/2022 334  150 - 400 K/uL Final   nRBC 01/24/2022 0.0  0.0 - 0.2 % Final   Neutrophils Relative % 01/24/2022 53  % Final   Neutro Abs 01/24/2022 3.3  1.7 - 7.7 K/uL Final   Lymphocytes Relative 01/24/2022 33  % Final   Lymphs Abs 01/24/2022 2.0  0.7 - 4.0 K/uL Final   Monocytes Relative 01/24/2022 11  % Final   Monocytes Absolute 01/24/2022 0.7  0.1 - 1.0 K/uL Final   Eosinophils Relative 01/24/2022 2  % Final   Eosinophils Absolute 01/24/2022 0.1  0.0 - 0.5 K/uL Final   Basophils Relative 01/24/2022 1  % Final   Basophils Absolute 01/24/2022 0.1  0.0 - 0.1 K/uL Final   Immature Granulocytes 01/24/2022 0  % Final   Abs Immature Granulocytes 01/24/2022 0.02  0.00 - 0.07 K/uL Final   Performed at [facility], [place] [address], [place], [place] [address]   Glucose-Capillary 01/24/2022 110 (H)  70 - 99 mg/dL Final   Glucose reference range applies only to samples taken after fasting for at least 8 hours.  No results displayed because visit has over 200 results.    Admission on 11/22/2021, Discharged on 11/22/2021  Component Date Value Ref Range Status   Glucose-Capillary 11/22/2021 169 (H)  70 - 99 mg/dL Final   Glucose reference range applies only to samples taken after fasting for at least 8 hours.  No results displayed because visit has over 200 results.    Admission on 10/04/2021, Discharged on 10/22/2021  Component Date Value Ref Range Status   SARS Coronavirus 2 by RT PCR 10/04/2021 NEGATIVE  NEGATIVE Final   Comment: (NOTE) SARS-CoV-2 target nucleic acids are NOT DETECTED.  The SARS-CoV-2 RNA is generally detectable in upper respiratory specimens during the acute phase of infection. The lowest concentration of SARS-CoV-2 viral copies this assay can detect  is 138 copies/mL. A negative result does not preclude SARS-Cov-2 infection and should not be used as the sole basis for treatment or other patient management decisions. A negative result may occur with  improper specimen collection/handling, submission of specimen other than nasopharyngeal swab, presence of viral mutation(s) within the areas targeted by this assay, and inadequate number of viral copies(<138 copies/mL). A negative result must be combined with clinical observations, patient history, and epidemiological information. The expected result is Negative.  Fact Sheet for Patients:  EntrepreneurPulse.com.au  Fact Sheet for Healthcare Providers:  IncredibleEmployment.be  This test is no                          t yet approved or cleared by the [place] FDA and  has been authorized for detection and/or diagnosis of SARS-CoV-2 by FDA under an Emergency Use Authorization (EUA). This EUA will remain  in effect (meaning this test can be used) for the duration of the COVID-19 declaration under Section 564(b)(1) of the Act, 21 U.S.C.section 360bbb-3(b)(1), unless the authorization is terminated  or revoked sooner.  Influenza A by PCR 10/04/2021 NEGATIVE  NEGATIVE Final   Influenza B by PCR 10/04/2021 NEGATIVE  NEGATIVE Final   Comment: (NOTE) The Xpert Xpress SARS-CoV-2/FLU/RSV plus assay is intended as an aid in the diagnosis of influenza from Nasopharyngeal swab specimens and should not be used as a sole basis for treatment. Nasal washings and aspirates are unacceptable for Xpert Xpress SARS-CoV-2/FLU/RSV testing.  Fact Sheet for Patients: EntrepreneurPulse.com.au  Fact Sheet for Healthcare Providers: IncredibleEmployment.be  This test is not yet approved or cleared by the [place] FDA and has been authorized for detection and/or diagnosis of SARS-CoV-2 by FDA under an Emergency Use  Authorization (EUA). This EUA will remain in effect (meaning this test can be used) for the duration of the COVID-19 declaration under Section 564(b)(1) of the Act, 21 U.S.C. section 360bbb-3(b)(1), unless the authorization is terminated or revoked.  Performed at [facility], [place] [address], [place], [place] [address]    WBC 10/04/2021 8.4  4.0 - 10.5 K/uL Final   RBC 10/04/2021 4.42  3.87 - 5.11 MIL/uL Final   Hemoglobin 10/04/2021 11.6 (L)  12.0 - 15.0 g/dL Final   HCT 10/04/2021 35.7 (L)  36.0 - 46.0 % Final   MCV 10/04/2021 80.8  80.0 - 100.0 fL Final   MCH 10/04/2021 26.2  26.0 - 34.0 pg Final   MCHC 10/04/2021 32.5  30.0 - 36.0 g/dL Final   RDW 10/04/2021 16.0 (H)  11.5 - 15.5 % Final   Platelets 10/04/2021 372  150 - 400 K/uL Final   nRBC 10/04/2021 0.0  0.0 - 0.2 % Final   Neutrophils Relative % 10/04/2021 68  % Final   Neutro Abs 10/04/2021 5.7  1.7 - 7.7 K/uL Final   Lymphocytes Relative 10/04/2021 22  % Final   Lymphs Abs 10/04/2021 1.8  0.7 - 4.0 K/uL Final   Monocytes Relative 10/04/2021 8  % Final   Monocytes Absolute 10/04/2021 0.7  0.1 - 1.0 K/uL Final   Eosinophils Relative 10/04/2021 1  % Final   Eosinophils Absolute 10/04/2021 0.1  0.0 - 0.5 K/uL Final   Basophils Relative 10/04/2021 1  % Final   Basophils Absolute 10/04/2021 0.1  0.0 - 0.1 K/uL Final   Immature Granulocytes 10/04/2021 0  % Final   Abs Immature Granulocytes 10/04/2021 0.03  0.00 - 0.07 K/uL Final   Performed at [facility], [place] [address], [place], [place] [address]   Sodium 10/04/2021 136  135 - 145 mmol/L Final   Potassium 10/04/2021 4.2  3.5 - 5.1 mmol/L Final   Chloride 10/04/2021 104  98 - 111 mmol/L Final   CO2 10/04/2021 25  22 - 32 mmol/L Final   Glucose, Bld 10/04/2021 100 (H)  70 - 99 mg/dL Final   Glucose reference range applies only to samples taken after fasting for at least 8 hours.   BUN 10/04/2021 9  6 - 20 mg/dL Final   Creatinine, Ser 10/04/2021 0.52  0.44 -  1.00 mg/dL Final   Calcium 10/04/2021 9.0  8.9 - 10.3 mg/dL Final   Total Protein 10/04/2021 7.0  6.5 - 8.1 g/dL Final   Albumin 10/04/2021 3.2 (L)  3.5 - 5.0 g/dL Final   AST 10/04/2021 13 (L)  15 - 41 U/L Final   ALT 10/04/2021 10  0 - 44 U/L Final   Alkaline Phosphatase 10/04/2021 61  38 - 126 U/L Final   Total Bilirubin 10/04/2021 0.3  0.3 - 1.2 mg/dL Final  GFR, Estimated 10/04/2021 >60  >60 mL/min Final   Comment: (NOTE) Calculated using the CKD-EPI Creatinine Equation (2021)    Anion gap 10/04/2021 7  5 - 15 Final   Performed at [facility], [place] [address] [place], [place] [address]   Hgb A1c MFr Bld 10/04/2021 7.0 (H)  4.8 - 5.6 % Final   Comment: (NOTE) Pre diabetes:          5.7%-6.4%  Diabetes:              >6.4%  Glycemic control for   <7.0% adults with diabetes    Mean Plasma Glucose 10/04/2021 154.2  mg/dL Final   Performed at [facility], [place] [address] [place], [place] [address]   Cholesterol 10/04/2021 178  0 - 200 mg/dL Final   Triglycerides 10/04/2021 155 (H)  <150 mg/dL Final   HDL 10/04/2021 52  >40 mg/dL Final   Total CHOL/HDL Ratio 10/04/2021 3.4  RATIO Final   VLDL 10/04/2021 31  0 - 40 mg/dL Final   LDL Cholesterol 10/04/2021 95  0 - 99 mg/dL Final   Comment:        Total Cholesterol/HDL:CHD Risk Coronary Heart Disease Risk Table                     Men   Women  1/2 Average Risk   3.4   3.3  Average Risk       5.0   4.4  2 X Average Risk   9.6   7.1  3 X Average Risk  23.4   11.0        Use the calculated Patient Ratio above and the CHD Risk Table to determine the patient's CHD Risk.        ATP III CLASSIFICATION (LDL):  <100     mg/dL   Optimal  100-129  mg/dL   Near or Above                    Optimal  130-159  mg/dL   Borderline  160-189  mg/dL   High  >190     mg/dL   Very High Performed at [place] [address] [place], [place] [address]    POC Amphetamine UR 10/04/2021 None Detected  NONE DETECTED (Cut  Off Level 1000 ng/mL) Final   POC Secobarbital (BAR) 10/04/2021 None Detected  NONE DETECTED (Cut Off Level 300 ng/mL) Final   POC Buprenorphine (BUP) 10/04/2021 None Detected  NONE DETECTED (Cut Off Level 10 ng/mL) Final   POC Oxazepam (BZO) 10/04/2021 None Detected  NONE DETECTED (Cut Off Level 300 ng/mL) Final   POC Cocaine UR 10/04/2021 None Detected  NONE DETECTED (Cut Off Level 300 ng/mL) Final   POC Methamphetamine UR 10/04/2021 None Detected  NONE DETECTED (Cut Off Level 1000 ng/mL) Final   POC Morphine 10/04/2021 None Detected  NONE DETECTED (Cut Off Level 300 ng/mL) Final   POC Methadone UR 10/04/2021 None Detected  NONE DETECTED (Cut Off Level 300 ng/mL) Final   POC Oxycodone UR 10/04/2021 Positive (A)  NONE DETECTED (Cut Off Level 100 ng/mL) Final   POC Marijuana UR 10/04/2021 None Detected  NONE DETECTED (Cut Off Level 50 ng/mL) Final   SARSCOV2ONAVIRUS 2 AG 10/04/2021 NEGATIVE  NEGATIVE Final   Comment: (NOTE) SARS-CoV-2 antigen NOT DETECTED.   Negative results are presumptive.  Negative results do not preclude SARS-CoV-2 infection and should not be used as the  sole basis for treatment or other patient management decisions, including infection  control decisions, particularly in the presence of clinical signs and  symptoms consistent with COVID-19, or in those who have been in contact with the virus.  Negative results must be combined with clinical observations, patient history, and epidemiological information. The expected result is Negative.  Fact Sheet for Patients: HandmadeRecipes.com.cy  Fact Sheet for Healthcare Providers: FuneralLife.at  This test is not yet approved or cleared by the [place] FDA and  has been authorized for detection and/or diagnosis of SARS-CoV-2 by FDA under an Emergency Use Authorization (EUA).  This EUA will remain in effect (meaning this test can be used) for the duration of  the COV                           ID-19 declaration under Section 564(b)(1) of the Act, 21 U.S.C. section 360bbb-3(b)(1), unless the authorization is terminated or revoked sooner.     Valproic Acid Lvl 10/04/2021 51  50.0 - 100.0 ug/mL Final   Performed at [facility] [address], [place], [place] [address]   Valproic Acid Lvl 10/08/2021 57  50.0 - 100.0 ug/mL Final   Performed at [facility] [address] [place], [place] [address]   Glucose-Capillary 10/09/2021 123 (H)  70 - 99 mg/dL Final   Glucose reference range applies only to samples taken after fasting for at least 8 hours.  Admission on 09/09/2021, Discharged on 09/10/2021  Component Date Value Ref Range Status   Sodium 09/09/2021 134 (L)  135 - 145 mmol/L Final   Potassium 09/09/2021 4.3  3.5 - 5.1 mmol/L Final   Chloride 09/09/2021 99  98 - 111 mmol/L Final   CO2 09/09/2021 25  22 - 32 mmol/L Final   Glucose, Bld 09/09/2021 107 (H)  70 - 99 mg/dL Final   Glucose reference range applies only to samples taken after fasting for at least 8 hours.   BUN 09/09/2021 12  6 - 20 mg/dL Final   Creatinine, Ser 09/09/2021 0.74  0.44 - 1.00 mg/dL Final   Calcium 09/09/2021 9.0  8.9 - 10.3 mg/dL Final   Total Protein 09/09/2021 7.4  6.5 - 8.1 g/dL Final   Albumin 09/09/2021 3.1 (L)  3.5 - 5.0 g/dL Final   AST 09/09/2021 13 (L)  15 - 41 U/L Final   ALT 09/09/2021 13  0 - 44 U/L Final   Alkaline Phosphatase 09/09/2021 66  38 - 126 U/L Final   Total Bilirubin 09/09/2021 0.2 (L)  0.3 - 1.2 mg/dL Final   GFR, Estimated 09/09/2021 >60  >60 mL/min Final   Comment: (NOTE) Calculated using the CKD-EPI Creatinine Equation (2021)    Anion gap 09/09/2021 10  5 - 15 Final   Performed at [facility], [place] [address], [place], [place] [address]   WBC 09/09/2021 8.5  4.0 - 10.5 K/uL Final   RBC 09/09/2021 4.53  3.87 - 5.11 MIL/uL Final   Hemoglobin 09/09/2021 11.8 (L)  12.0 - 15.0 g/dL Final   HCT 09/09/2021 38.0  36.0 - 46.0 % Final    MCV 09/09/2021 83.9  80.0 - 100.0 fL Final   MCH 09/09/2021 26.0  26.0 - 34.0 pg Final   MCHC 09/09/2021 31.1  30.0 - 36.0 g/dL Final   RDW 09/09/2021 17.8 (H)  11.5 - 15.5 % Final   Platelets 09/09/2021 442 (H)  150 - 400  K/uL Final   nRBC 09/09/2021 0.0  0.0 - 0.2 % Final   Neutrophils Relative % 09/09/2021 70  % Final   Neutro Abs 09/09/2021 5.9  1.7 - 7.7 K/uL Final   Lymphocytes Relative 09/09/2021 20  % Final   Lymphs Abs 09/09/2021 1.7  0.7 - 4.0 K/uL Final   Monocytes Relative 09/09/2021 8  % Final   Monocytes Absolute 09/09/2021 0.7  0.1 - 1.0 K/uL Final   Eosinophils Relative 09/09/2021 1  % Final   Eosinophils Absolute 09/09/2021 0.1  0.0 - 0.5 K/uL Final   Basophils Relative 09/09/2021 1  % Final   Basophils Absolute 09/09/2021 0.1  0.0 - 0.1 K/uL Final   Immature Granulocytes 09/09/2021 0  % Final   Abs Immature Granulocytes 09/09/2021 0.03  0.00 - 0.07 K/uL Final   Performed at [facility], [place] [address] [place], [place] [address]   Ammonia 09/09/2021 37 (H)  9 - 35 umol/L Final   Comment: HEMOLYSIS AT THIS LEVEL MAY AFFECT RESULT Performed at [facility], [place] [address] [place], [place] [address]    Opiates 09/09/2021 NONE DETECTED  NONE DETECTED Final   Cocaine 09/09/2021 NONE DETECTED  NONE DETECTED Final   Benzodiazepines 09/09/2021 NONE DETECTED  NONE DETECTED Final   Amphetamines 09/09/2021 NONE DETECTED  NONE DETECTED Final   Tetrahydrocannabinol 09/09/2021 NONE DETECTED  NONE DETECTED Final   Barbiturates 09/09/2021 NONE DETECTED  NONE DETECTED Final   Comment: (NOTE) DRUG SCREEN FOR MEDICAL PURPOSES ONLY.  IF CONFIRMATION IS NEEDED FOR ANY PURPOSE, NOTIFY LAB WITHIN 5 DAYS.  LOWEST DETECTABLE LIMITS FOR URINE DRUG SCREEN Drug Class                     Cutoff (ng/mL) Amphetamine and metabolites    1000 Barbiturate and metabolites    200 Benzodiazepine                 051 Tricyclics and metabolites     300 Opiates and metabolites         300 Cocaine and metabolites        300 THC                            50 Performed at [facility], [place] [address] [place], [place] [address]    Alcohol, Ethyl (B) 09/09/2021 <10  <10 mg/dL Final   Comment: (NOTE) Lowest detectable limit for serum alcohol is 10 mg/dL.  For medical purposes only. Performed at [facility], [place] [address] [place], [place] [address]    Lipase 09/09/2021 33  11 - 51 U/L Final   Performed at [facility] [address], [place], [place] [address]   Color, Urine 09/09/2021 COLORLESS (A)  YELLOW Final   APPearance 09/09/2021 CLEAR  CLEAR Final   Specific Gravity, Urine 09/09/2021 1.002 (L)  1.005 - 1.030 Final   pH 09/09/2021 6.0  5.0 - 8.0 Final   Glucose, UA 09/09/2021 NEGATIVE  NEGATIVE mg/dL Final   Hgb urine dipstick 09/09/2021 NEGATIVE  NEGATIVE Final   Bilirubin Urine 09/09/2021 NEGATIVE  NEGATIVE Final   Ketones, ur 09/09/2021 NEGATIVE  NEGATIVE mg/dL Final   Protein, ur 09/09/2021 NEGATIVE  NEGATIVE mg/dL Final   Nitrite 09/09/2021 NEGATIVE  NEGATIVE Final   Leukocytes,Ua 09/09/2021 NEGATIVE  NEGATIVE Final   Performed at [facility] [address],  [place], [place] [address]   Specimen Description 09/09/2021 URINE, CLEAN CATCH   Final   Special Requests 09/09/2021 NONE   Final   Culture 09/09/2021  (A)   Final                   Value:<10,000 COLONIES/mL INSIGNIFICANT GROWTH Performed at [facility], [place] [address] [place], [place] [address]    Report Status 09/09/2021 09/10/2021 FINAL   Final   D-Dimer, Quant 09/09/2021 0.92 (H)  0.00 - 0.50 ug/mL-FEU Final   Comment: (NOTE) At the manufacturer cut-off value of 0.5 g/mL FEU, this assay has a negative predictive value of 95-100%.This assay is intended for use in conjunction with a clinical pretest probability (PTP) assessment model to exclude pulmonary embolism (PE) and deep venous thrombosis (DVT) in outpatients suspected of PE or DVT. Results should  be correlated with clinical presentation. Performed at [facility], [place] [address] [place], [place] [address]    Troponin I (High Sensitivity) 09/09/2021 5  <18 ng/L Final   Comment: (NOTE) Elevated high sensitivity troponin I (hsTnI) values and significant  changes across serial measurements may suggest ACS but many other  chronic and acute conditions are known to elevate hsTnI results.  Refer to the "Links" section for chest pain algorithms and additional  guidance. Performed at [facility], [place] [address] [place], [place] [address]    BP 09/10/2021 135/83  mmHg Final   S' Lateral 09/10/2021 2.30  cm Final   Area-P 1/2 09/10/2021 5.58  cm2 Final    Allergies: Penicillins and Penicillin g  PTA Medications: (Not in a hospital admission)  Prior to Admission medications   Medication Sig Start Date End Date Taking? Authorizing Provider  Accu-Chek Softclix Lancets lancets Use as directed up to four times daily 11/16/21   Thurnell Lose, MD  apixaban (ELIQUIS) 5 MG TABS tablet Take 1 tablet (5 mg total) by mouth 2 (two) times daily. 11/16/21   Thurnell Lose, MD  ARIPiprazole (ABILIFY) 10 MG tablet Take 1 tablet (10 mg total) by mouth daily. 11/17/21   Thurnell Lose, MD  Blood Glucose Monitoring Suppl (ACCU-CHEK GUIDE) w/Device KIT Use as directed up to four times daily 11/16/21   Thurnell Lose, MD  budesonide-formoterol [facility]) 160-4.5 MCG/ACT inhaler Inhale 2 puffs into the lungs in the morning and at bedtime. 01/08/21   [provider]  Cholecalciferol (VITAMIN D3) 1.25 MG (50000 UT) CAPS Take 50,000 Units by mouth every Thursday.    [provider]  clozapine (CLOZARIL) 50 MG tablet Take 1 tablet (50 mg total) by mouth daily. 11/17/21   Thurnell Lose, MD  colchicine 0.6 MG tablet Take 1 tablet (0.6 mg total) by mouth at bedtime. 02/09/22 03/11/22  Delene Ruffini, MD  diltiazem (CARDIZEM CD) 240 MG 24 hr capsule Take 1 capsule (240  mg total) by mouth daily. Patient not taking: Reported on 11/24/2021 11/17/21   Thurnell Lose, MD  divalproex (DEPAKOTE ER) 500 MG 24 hr tablet Take 1 tablet (500 mg total) by mouth 2 (two) times daily. 11/16/21   Thurnell Lose, MD  glucose blood test strip Use as directed up to four times daily 11/16/21   Thurnell Lose, MD  haloperidol (HALDOL) 10 MG tablet Take 1 tablet (10 mg total) by mouth 3 (three) times daily as needed for agitation (and psychotic symptoms). 11/16/21   Thurnell Lose, MD  INGREZZA 40 MG capsule Take 1  capsule (40 mg total) by mouth in the morning. 11/16/21   Thurnell Lose, MD  insulin aspart (NOVOLOG) 100 UNIT/ML FlexPen Before each meal 3 times a day, 140-199 - 2 units, 200-250 - 4 units, 251-299 - 6 units,  300-349 - 8 units,  350 or above 10 units. Insulin PEN if approved, provide syringes and needles if needed.Please switch to any approved short acting Insulin if needed. 11/16/21   Thurnell Lose, MD  insulin glargine (LANTUS) 100 UNIT/ML Solostar Pen Inject 12 Units into the skin daily. 11/17/21   Thurnell Lose, MD  Insulin Pen Needle 32G X 4 MM MISC Use 4 times a day with insulin, 1 month supply. 11/16/21   Thurnell Lose, MD  latanoprost (XALATAN) 0.005 % ophthalmic solution Place 1 drop into both eyes at bedtime.    [provider]  metFORMIN (GLUCOPHAGE) 500 MG tablet Take 500 mg by mouth 2 (two) times daily with a meal.    [provider]  nicotine (NICODERM CQ - DOSED IN MG/24 HR) 7 mg/24hr patch Place 1 patch (7 mg total) onto the skin daily. 10/22/21   Corky Sox, MD  PROAIR HFA 108 [phone] Base) MCG/ACT inhaler Inhale 2 puffs into the lungs every 6 (six) hours as needed for wheezing or shortness of breath.    [provider]   Medical Decision Making  Cardiac workup in the ER was unremarkable including nonischemic troponins and no acute EKG changes, however she was noted to have a sinus tachycardia.   EDP  recommendations Medication Recommendations:  trial of colchicine 0.6 mg QHS x 3-6 months recurrent pericarditis. Will initiate this order. Other recommendations: Continue diltiazem and Eliquis for afib, recommend repeat outpatient echo in 3 months. Follow up as an outpatient:  Dr. Harriet Masson  Reviewed Labs drawn at the ED 12/11-/12/12:CMP WNL, Troponin 1 normal, CBC-HGB & HCT low, ECHO-moderately sized circumferential pericardial effusion, Iron level 20 abnormal-will initiate ferrous sulphate 325 mg PO daily.  Current/new medications reordered -Eliquis 5 mg p.o. twice daily A-fib -Abilify 10 mg p.o. daily mood -Vitamin D3 50,000 units p.o. every Thursday Vit D deficiency -Clozaril 25 mg p.o. nightly Schizophrenia -Clozaril 25 mg p.o. daily Schizophrenia -Colchicine 0.6 mg p.o. nightly recurrent pericarditis -Cardizem 24-hour capsule 240 mg p.o. daily A-fib -Depakote ER 500 mg p.o. twice daily mood -Haldol 10 mg p.o. 3 times daily as needed agitation, psychotic symptoms -Diabetes mellitus care plan insulin protocol before meals and at bedtime -Ingrezza 40 mg p.o. daily, 3 Tardive dyskinesia -Metformin 500 mg p.o. twice daily with meals type 2 diabetes -Ferrous sulphate 325 mg Po daily with breakfast for iron deficiency  Other PRNs -Tylenol 650 mg p.o. every 6 hours as needed pain -Maalox 30 mm p.o. every 4 hours as needed indigestion -Atarax 25 mg p.o. 3 times daily as needed anxiety -MOM 30 mL p.o. daily as needed constipation -Cepacol lozenge 3 mg p.o. as needed sore throat -Trazodone 50 mg p.o. nightly as needed sleep    Recommendations  Based on my evaluation the patient does not appear to have an emergency medical condition.  Patient readmitted to the continuous observation unit pending placement. Social work and DSS are involved.  Randon Goldsmith, NP 02/09/22  9:24 PM

## 2022-02-09 NOTE — ED Notes (Signed)
Pt A&O x 4, transfer from [facility], presented with Chest discomfort and SOB, sent over for medical clearance.  Pt denies symptoms at present, no respiratory distress noted. No chest pain noted, calm & cooperative.  Denies SI, HI or AVH.  Monitoring for safety.

## 2022-02-10 DIAGNOSIS — F209 Schizophrenia, unspecified: Secondary | ICD-10-CM

## 2022-02-10 LAB — GLUCOSE, CAPILLARY
Glucose-Capillary: 148 mg/dL — ABNORMAL HIGH (ref 70–99)
Glucose-Capillary: 174 mg/dL — ABNORMAL HIGH (ref 70–99)
Glucose-Capillary: 128 mg/dL — ABNORMAL HIGH (ref 70–99)
Glucose-Capillary: 182 mg/dL — ABNORMAL HIGH (ref 70–99)

## 2022-02-10 MED ORDER — CLOZAPINE 25 MG PO TABS
50.0000 mg | ORAL_TABLET | Freq: Every day | ORAL | Status: DC
  Administered 2022-02-10 – 2022-02-11 (×2): 50 mg via ORAL
  Filled 2022-02-10 (×2): qty 2

## 2022-02-10 MED ORDER — VITAMIN D (ERGOCALCIFEROL) 1.25 MG (50000 UNIT) PO CAPS
50000.0000 [IU] | ORAL_CAPSULE | ORAL | Status: DC
  Administered 2022-02-11: 50000 [IU] via ORAL
  Filled 2022-02-10: qty 1

## 2022-02-10 MED ORDER — CLOZAPINE 25 MG PO TABS
75.0000 mg | ORAL_TABLET | Freq: Every day | ORAL | Status: DC
  Administered 2022-02-10 – 2022-02-16 (×7): 75 mg via ORAL
  Filled 2022-02-10 (×7): qty 3

## 2022-02-10 NOTE — ED Notes (Signed)
Breakfast was given 

## 2022-02-10 NOTE — ED Notes (Signed)
Pt sleeping@this time. Breathing even and unlabored. Will continue to monitor for safety 

## 2022-02-10 NOTE — Progress Notes (Signed)
Received Teresa Murillo this AM asleep in her bed, she woke up received breakfast and was medicated per order. She endorsed feeling a little depressed related to her family. She remained visible in the milieu and social with her peers.

## 2022-02-10 NOTE — ED Provider Notes (Signed)
Behavioral Health Progress Note  Date and Time: 02/10/2022 8:21 AM Name: Teresa Murillo MRN:  [id number]  Reason for Admission: Teresa Murillo is a 61 year old female with a past psychiatric history significant for schizophrenia, aggressive behavior, and possible intellectual disability who presented to [facility] on 11/23/2021 as a walk-in. At the time, patient had been dismissed from the group home due to multiple elopements. Patient has remained at [facility] while awaiting new placement. Social work and DSS are involved.   Subjective:  Seen and assessed at bedside. Denies SI/HI/AVH. Denies chest pain. Educated patient on pericardial effusion and need for colchicine. Verbalized understanding. Denies any somatic complaints at this time. No GI problems. Denies chest pain.  Diagnosis:  Final diagnoses:  At risk for self care deficit  Schizophrenia, unspecified type ([place])  Noncompliance with medication regimen  Borderline intellectual functioning    Total Time spent with patient: 45 minutes  Past Psychiatric History: see H&P Past Medical History:  Past Medical History:  Diagnosis Date   Borderline intellectual functioning 12/14/2021   On 12/14/2021: Appreciate assistance from psychology consult. On the Wechsler Adult Intelligence Scale-4, Teresa Murillo achieved a full-scale IQ score of 73 and a percentile rank of 4 placing her in the borderline range of intellectual functioning.    Chronic obstructive pulmonary disease (COPD) (HCC)    Glaucoma    Hyperlipidemia    Hypertension    Iron deficiency    Schizoaffective disorder (HCC)    Type 2 diabetes mellitus (HCC)     Past Surgical History:  Procedure Laterality Date   TUBAL LIGATION     Family History:  Family History  Problem Relation Age of Onset   Breast cancer Maternal Grandmother    Family Psychiatric  History: see H&P Social History:  Social History   Substance and Sexual Activity  Alcohol Use Not Currently     Social  History   Substance and Sexual Activity  Drug Use Not Currently    Social History   Socioeconomic History   Marital status: Divorced    Spouse name: Not on file   Number of children: Not on file   Years of education: Not on file   Highest education level: Not on file  Occupational History   Not on file  Tobacco Use   Smoking status: Former    Packs/day: 1.00    Types: Cigars, Cigarettes    Quit date: 11/09/2021    Years since quitting: 0.2   Smokeless tobacco: Current  Vaping Use   Vaping Use: Never used  Substance and Sexual Activity   Alcohol use: Not Currently   Drug use: Not Currently   Sexual activity: Not Currently    Birth control/protection: Surgical  Other Topics Concern   Not on file  Social History Narrative   Not on file   Social Determinants of Health   Financial Resource Strain: Not on file  Food Insecurity: Not on file  Transportation Needs: Not on file  Physical Activity: Not on file  Stress: Not on file  Social Connections: Not on file   SDOH:  SDOH Screenings   Depression (PHQ2-9): Low Risk  (11/23/2021)  Tobacco Use: High Risk (02/08/2022)   Additional Social History:                         Sleep: Fair  Appetite:  Fair  Current Medications:  Current Facility-Administered Medications  Medication Dose Route Frequency Provider Last Rate Last Admin   acetaminophen (TYLENOL)  tablet 650 mg  650 mg Oral Q6H PRN Onuoha, Chinwendu V, NP   650 mg at 02/09/22 2201   alum & mag hydroxide-simeth (MAALOX/MYLANTA) 200-200-20 MG/5ML suspension 30 mL  30 mL Oral Q4H PRN Onuoha, Chinwendu V, NP   30 mL at 02/10/22 0133   apixaban (ELIQUIS) tablet 5 mg  5 mg Oral BID Onuoha, Chinwendu V, NP   5 mg at 02/09/22 2202   ARIPiprazole (ABILIFY) tablet 10 mg  10 mg Oral Daily Onuoha, Chinwendu V, NP       [START ON 02/11/2022] cholecalciferol (VITAMIN D3) tablet 50,000 Units  50,000 Units Oral Q Thu Onuoha, Chinwendu V, NP       cloZAPine (CLOZARIL)  tablet 25 mg  25 mg Oral QHS Onuoha, Chinwendu V, NP   25 mg at 02/09/22 2201   cloZAPine (CLOZARIL) tablet 25 mg  25 mg Oral Daily Onuoha, Chinwendu V, NP       colchicine tablet 0.6 mg  0.6 mg Oral QHS Onuoha, Chinwendu V, NP       diltiazem (CARDIZEM CD) 24 hr capsule 240 mg  240 mg Oral Daily Onuoha, Chinwendu V, NP       divalproex (DEPAKOTE ER) 24 hr tablet 500 mg  500 mg Oral BID Onuoha, Chinwendu V, NP       ferrous sulfate tablet 325 mg  325 mg Oral Q breakfast Onuoha, Chinwendu V, NP   325 mg at 02/10/22 0756   haloperidol (HALDOL) tablet 10 mg  10 mg Oral TID PRN Onuoha, Chinwendu V, NP       hydrOXYzine (ATARAX) tablet 25 mg  25 mg Oral TID PRN Onuoha, Chinwendu V, NP       insulin aspart (novoLOG) injection 0-5 Units  0-5 Units Subcutaneous QHS Onuoha, Chinwendu V, NP       insulin aspart (novoLOG) injection 0-9 Units  0-9 Units Subcutaneous TID WC Onuoha, Chinwendu V, NP   1 Units at 02/10/22 0756   magnesium hydroxide (MILK OF MAGNESIA) suspension 30 mL  30 mL Oral Daily PRN Onuoha, Chinwendu V, NP       menthol-cetylpyridinium (CEPACOL) lozenge 3 mg  1 lozenge Oral PRN Onuoha, Chinwendu V, NP   3 mg at 02/10/22 0544   metFORMIN (GLUCOPHAGE) tablet 500 mg  500 mg Oral BID WC Onuoha, Chinwendu V, NP   500 mg at 02/10/22 0755   traZODone (DESYREL) tablet 50 mg  50 mg Oral QHS PRN Onuoha, Chinwendu V, NP   50 mg at 02/09/22 2201   valbenazine (INGREZZA) capsule 40 mg  40 mg Oral q AM Onuoha, Chinwendu V, NP   40 mg at 02/10/22 1191   Current Outpatient Medications  Medication Sig Dispense Refill   Accu-Chek Softclix Lancets lancets Use as directed up to four times daily 100 each 0   apixaban (ELIQUIS) 5 MG TABS tablet Take 1 tablet (5 mg total) by mouth 2 (two) times daily. 60 tablet 0   ARIPiprazole (ABILIFY) 10 MG tablet Take 1 tablet (10 mg total) by mouth daily. 30 tablet 0   Blood Glucose Monitoring Suppl (ACCU-CHEK GUIDE) w/Device KIT Use as directed up to four times daily 1  kit 0   budesonide-formoterol (SYMBICORT) 160-4.5 MCG/ACT inhaler Inhale 2 puffs into the lungs in the morning and at bedtime.     Cholecalciferol (VITAMIN D3) 1.25 MG (50000 UT) CAPS Take 50,000 Units by mouth every Thursday.     clozapine (CLOZARIL) 50 MG tablet Take 1 tablet (50 mg  total) by mouth daily. 30 tablet 0   colchicine 0.6 MG tablet Take 1 tablet (0.6 mg total) by mouth at bedtime. 30 tablet 0   diltiazem (CARDIZEM CD) 240 MG 24 hr capsule Take 1 capsule (240 mg total) by mouth daily. (Patient not taking: Reported on 11/24/2021) 30 capsule 0   divalproex (DEPAKOTE ER) 500 MG 24 hr tablet Take 1 tablet (500 mg total) by mouth 2 (two) times daily. 60 tablet 0   glucose blood test strip Use as directed up to four times daily 50 each 0   haloperidol (HALDOL) 10 MG tablet Take 1 tablet (10 mg total) by mouth 3 (three) times daily as needed for agitation (and psychotic symptoms).     INGREZZA 40 MG capsule Take 1 capsule (40 mg total) by mouth in the morning. 30 capsule 0   insulin aspart (NOVOLOG) 100 UNIT/ML FlexPen Before each meal 3 times a day, 140-199 - 2 units, 200-250 - 4 units, 251-299 - 6 units,  300-349 - 8 units,  350 or above 10 units. Insulin PEN if approved, provide syringes and needles if needed.Please switch to any approved short acting Insulin if needed. 15 mL 0   insulin glargine (LANTUS) 100 UNIT/ML Solostar Pen Inject 12 Units into the skin daily. 15 mL 0   Insulin Pen Needle 32G X 4 MM MISC Use 4 times a day with insulin, 1 month supply. 100 each 0   latanoprost (XALATAN) 0.005 % ophthalmic solution Place 1 drop into both eyes at bedtime.     metFORMIN (GLUCOPHAGE) 500 MG tablet Take 500 mg by mouth 2 (two) times daily with a meal.     nicotine (NICODERM CQ - DOSED IN MG/24 HR) 7 mg/24hr patch Place 1 patch (7 mg total) onto the skin daily. 28 patch 0   PROAIR HFA 108 (90 Base) MCG/ACT inhaler Inhale 2 puffs into the lungs every 6 (six) hours as needed for wheezing or  shortness of breath.      Labs  Lab Results:  Admission on 02/09/2022  Component Date Value Ref Range Status   Glucose-Capillary 02/09/2022 118 (H)  70 - 99 mg/dL Final   Glucose reference range applies only to samples taken after fasting for at least 8 hours.   Glucose-Capillary 02/10/2022 148 (H)  70 - 99 mg/dL Final   Glucose reference range applies only to samples taken after fasting for at least 8 hours.  Admission on 02/08/2022, Discharged on 02/09/2022  Component Date Value Ref Range Status   B Natriuretic Peptide 02/08/2022 40.2  0.0 - 100.0 pg/mL Final   Performed at [facility], [address], [place], [place] [address]   Troponin I (High Sensitivity) 02/08/2022 4  <18 ng/L Final   Comment: (NOTE) Elevated high sensitivity troponin I (hsTnI) values and significant  changes across serial measurements may suggest ACS but many other  chronic and acute conditions are known to elevate hsTnI results.  Refer to the "Links" section for chest pain algorithms and additional  guidance. Performed at [facility], [place] [address] [place], [place] [address]    WBC 02/08/2022 8.5  4.0 - 10.5 K/uL Final   RBC 02/08/2022 4.30  3.87 - 5.11 MIL/uL Final   Hemoglobin 02/08/2022 10.7 (L)  12.0 - 15.0 g/dL Final   HCT 02/08/2022 33.1 (L)  36.0 - 46.0 % Final   MCV 02/08/2022 77.0 (L)  80.0 - 100.0 fL Final   MCH 02/08/2022 24.9 (L)  26.0 -  34.0 pg Final   MCHC 02/08/2022 32.3  30.0 - 36.0 g/dL Final   RDW 02/08/2022 16.5 (H)  11.5 - 15.5 % Final   Platelets 02/08/2022 361  150 - 400 K/uL Final   nRBC 02/08/2022 0.0  0.0 - 0.2 % Final   Performed at [facility], [place] [address], [place], [place] [address]   Sodium 02/08/2022 131 (L)  135 - 145 mmol/L Final   Potassium 02/08/2022 4.4  3.5 - 5.1 mmol/L Final   Chloride 02/08/2022 96 (L)  98 - 111 mmol/L Final   CO2 02/08/2022 25  22 - 32 mmol/L Final   Glucose, Bld 02/08/2022 126 (H)  70 - 99 mg/dL Final   Glucose  reference range applies only to samples taken after fasting for at least 8 hours.   BUN 02/08/2022 11  6 - 20 mg/dL Final   Creatinine, Ser 02/08/2022 0.52  0.44 - 1.00 mg/dL Final   Calcium 02/08/2022 9.4  8.9 - 10.3 mg/dL Final   Total Protein 02/08/2022 7.5  6.5 - 8.1 g/dL Final   Albumin 02/08/2022 3.6  3.5 - 5.0 g/dL Final   AST 02/08/2022 22  15 - 41 U/L Final   ALT 02/08/2022 20  0 - 44 U/L Final   Alkaline Phosphatase 02/08/2022 67  38 - 126 U/L Final   Total Bilirubin 02/08/2022 0.1 (L)  0.3 - 1.2 mg/dL Final   GFR, Estimated 02/08/2022 >60  >60 mL/min Final   Comment: (NOTE) Calculated using the CKD-EPI Creatinine Equation (2021)    Anion gap 02/08/2022 10  5 - 15 Final   Performed at [facility] [address], [place], [place] [address]   Troponin I (High Sensitivity) 02/08/2022 5  <18 ng/L Final   Comment: (NOTE) Elevated high sensitivity troponin I (hsTnI) values and significant  changes across serial measurements may suggest ACS but many other  chronic and acute conditions are known to elevate hsTnI results.  Refer to the "Links" section for chest pain algorithms and additional  guidance. Performed at [facility], [place] [address] [place], [place] [address]    WBC 02/09/2022 7.3  4.0 - 10.5 K/uL Final   RBC 02/09/2022 4.70  3.87 - 5.11 MIL/uL Final   Hemoglobin 02/09/2022 11.4 (L)  12.0 - 15.0 g/dL Final   HCT 02/09/2022 36.6  36.0 - 46.0 % Final   MCV 02/09/2022 77.9 (L)  80.0 - 100.0 fL Final   MCH 02/09/2022 24.3 (L)  26.0 - 34.0 pg Final   MCHC 02/09/2022 31.1  30.0 - 36.0 g/dL Final   RDW 02/09/2022 16.6 (H)  11.5 - 15.5 % Final   Platelets 02/09/2022 403 (H)  150 - 400 K/uL Final   nRBC 02/09/2022 0.0  0.0 - 0.2 % Final   Performed at [place] [facility] [address], [place], [place] [address]   Glucose-Capillary 02/08/2022 117 (H)  70 - 99 mg/dL Final   Glucose reference range applies only to samples taken after fasting for at least 8  hours.   Sodium 02/09/2022 138  135 - 145 mmol/L Final   Potassium 02/09/2022 3.7  3.5 - 5.1 mmol/L Final   Chloride 02/09/2022 103  98 - 111 mmol/L Final   CO2 02/09/2022 24  22 - 32 mmol/L Final   Glucose, Bld 02/09/2022 134 (H)  70 - 99 mg/dL Final   Glucose reference range applies only to samples taken after fasting for at least 8  hours.   BUN 02/09/2022 9  6 - 20 mg/dL Final   Creatinine, Ser 02/09/2022 0.44  0.44 - 1.00 mg/dL Final   Calcium 02/09/2022 8.3 (L)  8.9 - 10.3 mg/dL Final   GFR, Estimated 02/09/2022 >60  >60 mL/min Final   Comment: (NOTE) Calculated using the CKD-EPI Creatinine Equation (2021)    Anion gap 02/09/2022 11  5 - 15 Final   Performed at [facility], [place] [address] [place], [place] [address]   Iron 02/09/2022 20 (L)  28 - 170 ug/dL Final   TIBC 02/09/2022 455 (H)  250 - 450 ug/dL Final   Saturation Ratios 02/09/2022 4 (L)  10.4 - 31.8 % Final   UIBC 02/09/2022 435  ug/dL Final   Performed at [facility], [place] [address] [place], [place] [address]   Ferritin 02/09/2022 2 (L)  11 - 307 ng/mL Final   Performed at [facility] [address], [place], [place] [address]   Weight 02/09/2022 2,846.58  oz Final   Height 02/09/2022 62  in Final   BP 02/09/2022 143/80  mmHg Final   WBC 02/09/2022 8.1  4.0 - 10.5 K/uL Final   RBC 02/09/2022 4.38  3.87 - 5.11 MIL/uL Final   Hemoglobin 02/09/2022 10.9 (L)  12.0 - 15.0 g/dL Final   HCT 02/09/2022 33.3 (L)  36.0 - 46.0 % Final   MCV 02/09/2022 76.0 (L)  80.0 - 100.0 fL Final   MCH 02/09/2022 24.9 (L)  26.0 - 34.0 pg Final   MCHC 02/09/2022 32.7  30.0 - 36.0 g/dL Final   RDW 02/09/2022 16.5 (H)  11.5 - 15.5 % Final   Platelets 02/09/2022 397  150 - 400 K/uL Final   nRBC 02/09/2022 0.0  0.0 - 0.2 % Final   Neutrophils Relative % 02/09/2022 66  % Final   Neutro Abs 02/09/2022 5.4  1.7 - 7.7 K/uL Final   Lymphocytes Relative 02/09/2022 24  % Final   Lymphs Abs 02/09/2022 1.9  0.7 - 4.0 K/uL Final    Monocytes Relative 02/09/2022 8  % Final   Monocytes Absolute 02/09/2022 0.6  0.1 - 1.0 K/uL Final   Eosinophils Relative 02/09/2022 1  % Final   Eosinophils Absolute 02/09/2022 0.1  0.0 - 0.5 K/uL Final   Basophils Relative 02/09/2022 1  % Final   Basophils Absolute 02/09/2022 0.1  0.0 - 0.1 K/uL Final   Immature Granulocytes 02/09/2022 0  % Final   Abs Immature Granulocytes 02/09/2022 0.03  0.00 - 0.07 K/uL Final   Performed at [facility], [place] [address] [place], [place] [address]   Glucose-Capillary 02/09/2022 238 (H)  70 - 99 mg/dL Final   Glucose reference range applies only to samples taken after fasting for at least 8 hours.   Glucose-Capillary 02/09/2022 91  70 - 99 mg/dL Final   Glucose reference range applies only to samples taken after fasting for at least 8 hours.   CRP 02/09/2022 <0.5  <1.0 mg/dL Final   Performed at [facility] [address] [place], [place] [address]   Sed Rate 02/09/2022 15  0 - 22 mm/hr Final   Performed at [facility] [address], [place], [place] [address]   Glucose-Capillary 02/09/2022 137 (H)  70 - 99 mg/dL Final   Glucose reference range applies only to samples taken after fasting for at least 8 hours.  Admission on 01/24/2022, Discharged on 02/08/2022  Component  Date Value Ref Range Status   Glucose-Capillary 01/24/2022 143 (H)  70 - 99 mg/dL Final   Glucose reference range applies only to samples taken after fasting for at least 8 hours.   Glucose-Capillary 01/24/2022 120 (H)  70 - 99 mg/dL Final   Glucose reference range applies only to samples taken after fasting for at least 8 hours.   Glucose-Capillary 01/25/2022 122 (H)  70 - 99 mg/dL Final   Glucose reference range applies only to samples taken after fasting for at least 8 hours.   Glucose-Capillary 01/25/2022 190 (H)  70 - 99 mg/dL Final   Glucose reference range applies only to samples taken after fasting for at least 8 hours.   Glucose-Capillary 01/25/2022  163 (H)  70 - 99 mg/dL Final   Glucose reference range applies only to samples taken after fasting for at least 8 hours.   WBC 01/26/2022 6.5  4.0 - 10.5 K/uL Final   RBC 01/26/2022 4.53  3.87 - 5.11 MIL/uL Final   Hemoglobin 01/26/2022 11.3 (L)  12.0 - 15.0 g/dL Final   HCT 01/26/2022 36.4  36.0 - 46.0 % Final   MCV 01/26/2022 80.4  80.0 - 100.0 fL Final   MCH 01/26/2022 24.9 (L)  26.0 - 34.0 pg Final   MCHC 01/26/2022 31.0  30.0 - 36.0 g/dL Final   RDW 01/26/2022 17.3 (H)  11.5 - 15.5 % Final   Platelets 01/26/2022 327  150 - 400 K/uL Final   nRBC 01/26/2022 0.0  0.0 - 0.2 % Final   Neutrophils Relative % 01/26/2022 60  % Final   Neutro Abs 01/26/2022 3.9  1.7 - 7.7 K/uL Final   Lymphocytes Relative 01/26/2022 31  % Final   Lymphs Abs 01/26/2022 2.0  0.7 - 4.0 K/uL Final   Monocytes Relative 01/26/2022 7  % Final   Monocytes Absolute 01/26/2022 0.5  0.1 - 1.0 K/uL Final   Eosinophils Relative 01/26/2022 1  % Final   Eosinophils Absolute 01/26/2022 0.1  0.0 - 0.5 K/uL Final   Basophils Relative 01/26/2022 1  % Final   Basophils Absolute 01/26/2022 0.1  0.0 - 0.1 K/uL Final   Immature Granulocytes 01/26/2022 0  % Final   Abs Immature Granulocytes 01/26/2022 0.02  0.00 - 0.07 K/uL Final   Performed at [facility], [place] [address], [place], [place] [address]   Glucose-Capillary 01/26/2022 149 (H)  70 - 99 mg/dL Final   Glucose reference range applies only to samples taken after fasting for at least 8 hours.   Glucose-Capillary 01/26/2022 130 (H)  70 - 99 mg/dL Final   Glucose reference range applies only to samples taken after fasting for at least 8 hours.   Glucose-Capillary 01/26/2022 151 (H)  70 - 99 mg/dL Final   Glucose reference range applies only to samples taken after fasting for at least 8 hours.   Glucose-Capillary 01/26/2022 170 (H)  70 - 99 mg/dL Final   Glucose reference range applies only to samples taken after fasting for at least 8 hours.   Glucose-Capillary  01/27/2022 129 (H)  70 - 99 mg/dL Final   Glucose reference range applies only to samples taken after fasting for at least 8 hours.   Glucose-Capillary 01/27/2022 101 (H)  70 - 99 mg/dL Final   Glucose reference range applies only to samples taken after fasting for at least 8 hours.   Glucose-Capillary 01/27/2022 220 (H)  70 - 99 mg/dL Final   Glucose reference range applies  only to samples taken after fasting for at least 8 hours.   Glucose-Capillary 01/27/2022 154 (H)  70 - 99 mg/dL Final   Glucose reference range applies only to samples taken after fasting for at least 8 hours.   Glucose-Capillary 01/27/2022 163 (H)  70 - 99 mg/dL Final   Glucose reference range applies only to samples taken after fasting for at least 8 hours.   Glucose-Capillary 01/28/2022 127 (H)  70 - 99 mg/dL Final   Glucose reference range applies only to samples taken after fasting for at least 8 hours.   Glucose-Capillary 01/28/2022 110 (H)  70 - 99 mg/dL Final   Glucose reference range applies only to samples taken after fasting for at least 8 hours.   Glucose-Capillary 01/28/2022 167 (H)  70 - 99 mg/dL Final   Glucose reference range applies only to samples taken after fasting for at least 8 hours.   Glucose-Capillary 01/29/2022 145 (H)  70 - 99 mg/dL Final   Glucose reference range applies only to samples taken after fasting for at least 8 hours.   Glucose-Capillary 01/29/2022 203 (H)  70 - 99 mg/dL Final   Glucose reference range applies only to samples taken after fasting for at least 8 hours.   Glucose-Capillary 01/29/2022 153 (H)  70 - 99 mg/dL Final   Glucose reference range applies only to samples taken after fasting for at least 8 hours.   Glucose-Capillary 01/29/2022 166 (H)  70 - 99 mg/dL Final   Glucose reference range applies only to samples taken after fasting for at least 8 hours.   Glucose-Capillary 01/30/2022 142 (H)  70 - 99 mg/dL Final   Glucose reference range applies only to samples taken  after fasting for at least 8 hours.   Glucose-Capillary 01/30/2022 138 (H)  70 - 99 mg/dL Final   Glucose reference range applies only to samples taken after fasting for at least 8 hours.   Glucose-Capillary 01/30/2022 185 (H)  70 - 99 mg/dL Final   Glucose reference range applies only to samples taken after fasting for at least 8 hours.   Glucose-Capillary 01/30/2022 220 (H)  70 - 99 mg/dL Final   Glucose reference range applies only to samples taken after fasting for at least 8 hours.   Glucose-Capillary 01/31/2022 147 (H)  70 - 99 mg/dL Final   Glucose reference range applies only to samples taken after fasting for at least 8 hours.   Glucose-Capillary 01/31/2022 131 (H)  70 - 99 mg/dL Final   Glucose reference range applies only to samples taken after fasting for at least 8 hours.   Glucose-Capillary 01/31/2022 169 (H)  70 - 99 mg/dL Final   Glucose reference range applies only to samples taken after fasting for at least 8 hours.   Glucose-Capillary 01/31/2022 144 (H)  70 - 99 mg/dL Final   Glucose reference range applies only to samples taken after fasting for at least 8 hours.   Comment 1 01/31/2022 RN AWARE   Final   Glucose-Capillary 02/01/2022 117 (H)  70 - 99 mg/dL Final   Glucose reference range applies only to samples taken after fasting for at least 8 hours.   Glucose-Capillary 02/01/2022 180 (H)  70 - 99 mg/dL Final   Glucose reference range applies only to samples taken after fasting for at least 8 hours.   Glucose-Capillary 02/01/2022 209 (H)  70 - 99 mg/dL Final   Glucose reference range applies only to samples taken after fasting for at least  8 hours.   Glucose-Capillary 02/02/2022 131 (H)  70 - 99 mg/dL Final   Glucose reference range applies only to samples taken after fasting for at least 8 hours.   WBC 02/02/2022 7.5  4.0 - 10.5 K/uL Final   RBC 02/02/2022 4.52  3.87 - 5.11 MIL/uL Final   Hemoglobin 02/02/2022 11.3 (L)  12.0 - 15.0 g/dL Final   HCT 02/02/2022 35.0 (L)   36.0 - 46.0 % Final   MCV 02/02/2022 77.4 (L)  80.0 - 100.0 fL Final   MCH 02/02/2022 25.0 (L)  26.0 - 34.0 pg Final   MCHC 02/02/2022 32.3  30.0 - 36.0 g/dL Final   RDW 02/02/2022 16.7 (H)  11.5 - 15.5 % Final   Platelets 02/02/2022 345  150 - 400 K/uL Final   nRBC 02/02/2022 0.0  0.0 - 0.2 % Final   Neutrophils Relative % 02/02/2022 63  % Final   Neutro Abs 02/02/2022 4.7  1.7 - 7.7 K/uL Final   Lymphocytes Relative 02/02/2022 28  % Final   Lymphs Abs 02/02/2022 2.1  0.7 - 4.0 K/uL Final   Monocytes Relative 02/02/2022 7  % Final   Monocytes Absolute 02/02/2022 0.5  0.1 - 1.0 K/uL Final   Eosinophils Relative 02/02/2022 1  % Final   Eosinophils Absolute 02/02/2022 0.1  0.0 - 0.5 K/uL Final   Basophils Relative 02/02/2022 1  % Final   Basophils Absolute 02/02/2022 0.1  0.0 - 0.1 K/uL Final   Immature Granulocytes 02/02/2022 0  % Final   Abs Immature Granulocytes 02/02/2022 0.03  0.00 - 0.07 K/uL Final   Performed at [facility], [place] [address] [place], [place] [address]   Glucose-Capillary 02/02/2022 95  70 - 99 mg/dL Final   Glucose reference range applies only to samples taken after fasting for at least 8 hours.   Glucose-Capillary 02/02/2022 120 (H)  70 - 99 mg/dL Final   Glucose reference range applies only to samples taken after fasting for at least 8 hours.   Glucose-Capillary 02/02/2022 191 (H)  70 - 99 mg/dL Final   Glucose reference range applies only to samples taken after fasting for at least 8 hours.   Glucose-Capillary 02/03/2022 148 (H)  70 - 99 mg/dL Final   Glucose reference range applies only to samples taken after fasting for at least 8 hours.   Glucose-Capillary 02/03/2022 122 (H)  70 - 99 mg/dL Final   Glucose reference range applies only to samples taken after fasting for at least 8 hours.   Glucose-Capillary 02/03/2022 233 (H)  70 - 99 mg/dL Final   Glucose reference range applies only to samples taken after fasting for at least 8 hours.    Glucose-Capillary 02/04/2022 126 (H)  70 - 99 mg/dL Final   Glucose reference range applies only to samples taken after fasting for at least 8 hours.   Glucose-Capillary 02/04/2022 117 (H)  70 - 99 mg/dL Final   Glucose reference range applies only to samples taken after fasting for at least 8 hours.   Glucose-Capillary 02/04/2022 135 (H)  70 - 99 mg/dL Final   Glucose reference range applies only to samples taken after fasting for at least 8 hours.   Glucose-Capillary 02/04/2022 197 (H)  70 - 99 mg/dL Final   Glucose reference range applies only to samples taken after fasting for at least 8 hours.   Glucose-Capillary 02/05/2022 170 (H)  70 - 99 mg/dL Final   Glucose reference range applies only to  samples taken after fasting for at least 8 hours.   Glucose-Capillary 02/05/2022 107 (H)  70 - 99 mg/dL Final   Glucose reference range applies only to samples taken after fasting for at least 8 hours.   Glucose-Capillary 02/05/2022 184 (H)  70 - 99 mg/dL Final   Glucose reference range applies only to samples taken after fasting for at least 8 hours.   Glucose-Capillary 02/05/2022 256 (H)  70 - 99 mg/dL Final   Glucose reference range applies only to samples taken after fasting for at least 8 hours.   Glucose-Capillary 02/06/2022 144 (H)  70 - 99 mg/dL Final   Glucose reference range applies only to samples taken after fasting for at least 8 hours.   Glucose-Capillary 02/06/2022 190 (H)  70 - 99 mg/dL Final   Glucose reference range applies only to samples taken after fasting for at least 8 hours.   Glucose-Capillary 02/06/2022 92  70 - 99 mg/dL Final   Glucose reference range applies only to samples taken after fasting for at least 8 hours.   Trichomonas 02/06/2022 Negative   Final   Bacterial Vaginitis-Urine 02/06/2022 Negative   Final   Molecular Comment 02/06/2022 For tests bacteria and/or candida, this specimen does not meet the   Final   Molecular Comment 02/06/2022 strict criteria set by  the FDA. The result interpretation should be   Final   Molecular Comment 02/06/2022 considered in conjunction with the patient's clinical history.   Final   Comment 02/06/2022 Normal Reference Range Trichomonas - Negative   Final   Glucose-Capillary 02/06/2022 197 (H)  70 - 99 mg/dL Final   Glucose reference range applies only to samples taken after fasting for at least 8 hours.   Glucose-Capillary 02/07/2022 129 (H)  70 - 99 mg/dL Final   Glucose reference range applies only to samples taken after fasting for at least 8 hours.   Glucose-Capillary 02/07/2022 207 (H)  70 - 99 mg/dL Final   Glucose reference range applies only to samples taken after fasting for at least 8 hours.   Glucose-Capillary 02/07/2022 153 (H)  70 - 99 mg/dL Final   Glucose reference range applies only to samples taken after fasting for at least 8 hours.   Glucose-Capillary 02/08/2022 129 (H)  70 - 99 mg/dL Final   Glucose reference range applies only to samples taken after fasting for at least 8 hours.   Glucose-Capillary 02/08/2022 107 (H)  70 - 99 mg/dL Final   Glucose reference range applies only to samples taken after fasting for at least 8 hours.  Admission on 01/23/2022, Discharged on 01/24/2022  Component Date Value Ref Range Status   Sodium 01/23/2022 140  135 - 145 mmol/L Final   Potassium 01/23/2022 4.4  3.5 - 5.1 mmol/L Final   Chloride 01/23/2022 101  98 - 111 mmol/L Final   CO2 01/23/2022 25  22 - 32 mmol/L Final   Glucose, Bld 01/23/2022 198 (H)  70 - 99 mg/dL Final   Glucose reference range applies only to samples taken after fasting for at least 8 hours.   BUN 01/23/2022 13  6 - 20 mg/dL Final   Creatinine, Ser 01/23/2022 0.62  0.44 - 1.00 mg/dL Final   Calcium 01/23/2022 9.3  8.9 - 10.3 mg/dL Final   GFR, Estimated 01/23/2022 >60  >60 mL/min Final   Comment: (NOTE) Calculated using the CKD-EPI Creatinine Equation (2021)    Anion gap 01/23/2022 14  5 - 15 Final   Performed at  [facility], [place] [address] [place], [place] [address]   WBC 01/23/2022 5.8  4.0 - 10.5 K/uL Final   RBC 01/23/2022 4.63  3.87 - 5.11 MIL/uL Final   Hemoglobin 01/23/2022 11.7 (L)  12.0 - 15.0 g/dL Final   HCT 01/23/2022 37.4  36.0 - 46.0 % Final   MCV 01/23/2022 80.8  80.0 - 100.0 fL Final   MCH 01/23/2022 25.3 (L)  26.0 - 34.0 pg Final   MCHC 01/23/2022 31.3  30.0 - 36.0 g/dL Final   RDW 01/23/2022 17.9 (H)  11.5 - 15.5 % Final   Platelets 01/23/2022 333  150 - 400 K/uL Final   nRBC 01/23/2022 0.0  0.0 - 0.2 % Final   Performed at [facility] [address] [place], [place] [address]   Troponin I (High Sensitivity) 01/23/2022 6  <18 ng/L Final   Comment: (NOTE) Elevated high sensitivity troponin I (hsTnI) values and significant  changes across serial measurements may suggest ACS but many other  chronic and acute conditions are known to elevate hsTnI results.  Refer to the "Links" section for chest pain algorithms and additional  guidance. Performed at [facility], [place] [address], [place], [place] [address]    Troponin I (High Sensitivity) 01/23/2022 5  <18 ng/L Final   Comment: (NOTE) Elevated high sensitivity troponin I (hsTnI) values and significant  changes across serial measurements may suggest ACS but many other  chronic and acute conditions are known to elevate hsTnI results.  Refer to the "Links" section for chest pain algorithms and additional  guidance. Performed at [facility], [place] [address], [place], [place] [address]    SARS Coronavirus 2 by RT PCR 01/23/2022 NEGATIVE  NEGATIVE Final   Comment: (NOTE) SARS-CoV-2 target nucleic acids are NOT DETECTED.  The SARS-CoV-2 RNA is generally detectable in upper and lower respiratory specimens during the acute phase of infection. The lowest concentration of SARS-CoV-2 viral copies this assay can detect is 250 copies / mL. A negative result does not preclude SARS-CoV-2 infection and should not be used as the  sole basis for treatment or other patient management decisions.  A negative result may occur with improper specimen collection / handling, submission of specimen other than nasopharyngeal swab, presence of viral mutation(s) within the areas targeted by this assay, and inadequate number of viral copies (<250 copies / mL). A negative result must be combined with clinical observations, patient history, and epidemiological information.  Fact Sheet for Patients:   https://www.patel.info/  Fact Sheet for Healthcare Providers: https://hall.com/  This test is not yet approved or                           cleared by the [place] FDA and has been authorized for detection and/or diagnosis of SARS-CoV-2 by FDA under an Emergency Use Authorization (EUA).  This EUA will remain in effect (meaning this test can be used) for the duration of the COVID-19 declaration under Section 564(b)(1) of the Act, 21 U.S.C. section 360bbb-3(b)(1), unless the authorization is terminated or revoked sooner.  Performed at [facility], [place] [address], [place], [place] [address]    B Natriuretic Peptide 01/23/2022 41.3  0.0 - 100.0 pg/mL Final   Performed at [facility] [address] [place], [place] [address]   Group A Strep by PCR 01/23/2022 NOT DETECTED  NOT DETECTED Final   Performed at  [facility], [place] [address] [place], [place] [address]   Color, Urine 01/23/2022 YELLOW  YELLOW Final   APPearance 01/23/2022 CLEAR  CLEAR Final   Specific Gravity, Urine 01/23/2022 1.018  1.005 - 1.030 Final   pH 01/23/2022 7.0  5.0 - 8.0 Final   Glucose, UA 01/23/2022 NEGATIVE  NEGATIVE mg/dL Final   Hgb urine dipstick 01/23/2022 NEGATIVE  NEGATIVE Final   Bilirubin Urine 01/23/2022 NEGATIVE  NEGATIVE Final   Ketones, ur 01/23/2022 NEGATIVE  NEGATIVE mg/dL Final   Protein, ur 01/23/2022 NEGATIVE  NEGATIVE mg/dL Final   Nitrite 01/23/2022 NEGATIVE   NEGATIVE Final   Leukocytes,Ua 01/23/2022 TRACE (A)  NEGATIVE Final   RBC / HPF 01/23/2022 0-5  0 - 5 RBC/hpf Final   WBC, UA 01/23/2022 0-5  0 - 5 WBC/hpf Final   Bacteria, UA 01/23/2022 NONE SEEN  NONE SEEN Final   Squamous Epithelial / LPF 01/23/2022 0-5  0 - 5 Final   Mucus 01/23/2022 PRESENT   Final   Performed at [facility], [place] [address] [place], [place] [address]   SARS Coronavirus 2 by RT PCR 01/23/2022 NEGATIVE  NEGATIVE Final   Comment: (NOTE) SARS-CoV-2 target nucleic acids are NOT DETECTED.  The SARS-CoV-2 RNA is generally detectable in upper respiratory specimens during the acute phase of infection. The lowest concentration of SARS-CoV-2 viral copies this assay can detect is 138 copies/mL. A negative result does not preclude SARS-Cov-2 infection and should not be used as the sole basis for treatment or other patient management decisions. A negative result may occur with  improper specimen collection/handling, submission of specimen other than nasopharyngeal swab, presence of viral mutation(s) within the areas targeted by this assay, and inadequate number of viral copies(<138 copies/mL). A negative result must be combined with clinical observations, patient history, and epidemiological information. The expected result is Negative.  Fact Sheet for Patients:  EntrepreneurPulse.com.au  Fact Sheet for Healthcare Providers:  IncredibleEmployment.be  This test is no                          t yet approved or cleared by the [place] FDA and  has been authorized for detection and/or diagnosis of SARS-CoV-2 by FDA under an Emergency Use Authorization (EUA). This EUA will remain  in effect (meaning this test can be used) for the duration of the COVID-19 declaration under Section 564(b)(1) of the Act, 21 U.S.C.section 360bbb-3(b)(1), unless the authorization is terminated  or revoked sooner.       Influenza A by PCR  01/23/2022 NEGATIVE  NEGATIVE Final   Influenza B by PCR 01/23/2022 NEGATIVE  NEGATIVE Final   Comment: (NOTE) The Xpert Xpress SARS-CoV-2/FLU/RSV plus assay is intended as an aid in the diagnosis of influenza from Nasopharyngeal swab specimens and should not be used as a sole basis for treatment. Nasal washings and aspirates are unacceptable for Xpert Xpress SARS-CoV-2/FLU/RSV testing.  Fact Sheet for Patients: EntrepreneurPulse.com.au  Fact Sheet for Healthcare Providers: IncredibleEmployment.be  This test is not yet approved or cleared by the [place] FDA and has been authorized for detection and/or diagnosis of SARS-CoV-2 by FDA under an Emergency Use Authorization (EUA). This EUA will remain in effect (meaning this test can be used) for the duration of the COVID-19 declaration under Section 564(b)(1) of the Act, 21 U.S.C. section 360bbb-3(b)(1), unless the authorization is terminated or revoked.  Performed at [facility], [place],  [place] [address]    WBC 01/24/2022 6.1  4.0 - 10.5 K/uL Final   RBC 01/24/2022 4.60  3.87 - 5.11 MIL/uL Final   Hemoglobin 01/24/2022 11.7 (L)  12.0 - 15.0 g/dL Final   HCT 01/24/2022 37.0  36.0 - 46.0 % Final   MCV 01/24/2022 80.4  80.0 - 100.0 fL Final   MCH 01/24/2022 25.4 (L)  26.0 - 34.0 pg Final   MCHC 01/24/2022 31.6  30.0 - 36.0 g/dL Final   RDW 01/24/2022 17.6 (H)  11.5 - 15.5 % Final   Platelets 01/24/2022 334  150 - 400 K/uL Final   nRBC 01/24/2022 0.0  0.0 - 0.2 % Final   Neutrophils Relative % 01/24/2022 53  % Final   Neutro Abs 01/24/2022 3.3  1.7 - 7.7 K/uL Final   Lymphocytes Relative 01/24/2022 33  % Final   Lymphs Abs 01/24/2022 2.0  0.7 - 4.0 K/uL Final   Monocytes Relative 01/24/2022 11  % Final   Monocytes Absolute 01/24/2022 0.7  0.1 - 1.0 K/uL Final   Eosinophils Relative 01/24/2022 2  % Final   Eosinophils Absolute 01/24/2022 0.1  0.0 - 0.5 K/uL Final    Basophils Relative 01/24/2022 1  % Final   Basophils Absolute 01/24/2022 0.1  0.0 - 0.1 K/uL Final   Immature Granulocytes 01/24/2022 0  % Final   Abs Immature Granulocytes 01/24/2022 0.02  0.00 - 0.07 K/uL Final   Performed at [facility], [place] [address] [place] [address] [place], [place] [address]   Glucose-Capillary 01/24/2022 110 (H)  70 - 99 mg/dL Final   Glucose reference range applies only to samples taken after fasting for at least 8 hours.  No results displayed because visit has over 200 results.    Admission on 11/22/2021, Discharged on 11/22/2021  Component Date Value Ref Range Status   Glucose-Capillary 11/22/2021 169 (H)  70 - 99 mg/dL Final   Glucose reference range applies only to samples taken after fasting for at least 8 hours.  No results displayed because visit has over 200 results.    Admission on 10/04/2021, Discharged on 10/22/2021  Component Date Value Ref Range Status   SARS Coronavirus 2 by RT PCR 10/04/2021 NEGATIVE  NEGATIVE Final   Comment: (NOTE) SARS-CoV-2 target nucleic acids are NOT DETECTED.  The SARS-CoV-2 RNA is generally detectable in upper respiratory specimens during the acute phase of infection. The lowest concentration of SARS-CoV-2 viral copies this assay can detect is 138 copies/mL. A negative result does not preclude SARS-Cov-2 infection and should not be used as the sole basis for treatment or other patient management decisions. A negative result may occur with  improper specimen collection/handling, submission of specimen other than nasopharyngeal swab, presence of viral mutation(s) within the areas targeted by this assay, and inadequate number of viral copies(<138 copies/mL). A negative result must be combined with clinical observations, patient history, and epidemiological information. The expected result is Negative.  Fact Sheet for Patients:  EntrepreneurPulse.com.au  Fact Sheet for Healthcare Providers:   IncredibleEmployment.be  This test is no                          t yet approved or cleared by the [place] FDA and  has been authorized for detection and/or diagnosis of SARS-CoV-2 by FDA under an Emergency Use Authorization (EUA). This EUA will remain  in effect (meaning this test can be used) for the duration of the COVID-19 declaration under Section  564(b)(1) of the Act, 21 U.S.C.section 360bbb-3(b)(1), unless the authorization is terminated  or revoked sooner.       Influenza A by PCR 10/04/2021 NEGATIVE  NEGATIVE Final   Influenza B by PCR 10/04/2021 NEGATIVE  NEGATIVE Final   Comment: (NOTE) The Xpert Xpress SARS-CoV-2/FLU/RSV plus assay is intended as an aid in the diagnosis of influenza from Nasopharyngeal swab specimens and should not be used as a sole basis for treatment. Nasal washings and aspirates are unacceptable for Xpert Xpress SARS-CoV-2/FLU/RSV testing.  Fact Sheet for Patients: EntrepreneurPulse.com.au  Fact Sheet for Healthcare Providers: IncredibleEmployment.be  This test is not yet approved or cleared by the [place] FDA and has been authorized for detection and/or diagnosis of SARS-CoV-2 by FDA under an Emergency Use Authorization (EUA). This EUA will remain in effect (meaning this test can be used) for the duration of the COVID-19 declaration under Section 564(b)(1) of the Act, 21 U.S.C. section 360bbb-3(b)(1), unless the authorization is terminated or revoked.  Performed at [facility], [place] [address] [place], [place] [address]    WBC 10/04/2021 8.4  4.0 - 10.5 K/uL Final   RBC 10/04/2021 4.42  3.87 - 5.11 MIL/uL Final   Hemoglobin 10/04/2021 11.6 (L)  12.0 - 15.0 g/dL Final   HCT 10/04/2021 35.7 (L)  36.0 - 46.0 % Final   MCV 10/04/2021 80.8  80.0 - 100.0 fL Final   MCH 10/04/2021 26.2  26.0 - 34.0 pg Final   MCHC 10/04/2021 32.5  30.0 - 36.0 g/dL Final   RDW 10/04/2021  16.0 (H)  11.5 - 15.5 % Final   Platelets 10/04/2021 372  150 - 400 K/uL Final   nRBC 10/04/2021 0.0  0.0 - 0.2 % Final   Neutrophils Relative % 10/04/2021 68  % Final   Neutro Abs 10/04/2021 5.7  1.7 - 7.7 K/uL Final   Lymphocytes Relative 10/04/2021 22  % Final   Lymphs Abs 10/04/2021 1.8  0.7 - 4.0 K/uL Final   Monocytes Relative 10/04/2021 8  % Final   Monocytes Absolute 10/04/2021 0.7  0.1 - 1.0 K/uL Final   Eosinophils Relative 10/04/2021 1  % Final   Eosinophils Absolute 10/04/2021 0.1  0.0 - 0.5 K/uL Final   Basophils Relative 10/04/2021 1  % Final   Basophils Absolute 10/04/2021 0.1  0.0 - 0.1 K/uL Final   Immature Granulocytes 10/04/2021 0  % Final   Abs Immature Granulocytes 10/04/2021 0.03  0.00 - 0.07 K/uL Final   Performed at [facility], [place] [address] [place], [place] [address]   Sodium 10/04/2021 136  135 - 145 mmol/L Final   Potassium 10/04/2021 4.2  3.5 - 5.1 mmol/L Final   Chloride 10/04/2021 104  98 - 111 mmol/L Final   CO2 10/04/2021 25  22 - 32 mmol/L Final   Glucose, Bld 10/04/2021 100 (H)  70 - 99 mg/dL Final   Glucose reference range applies only to samples taken after fasting for at least 8 hours.   BUN 10/04/2021 9  6 - 20 mg/dL Final   Creatinine, Ser 10/04/2021 0.52  0.44 - 1.00 mg/dL Final   Calcium 10/04/2021 9.0  8.9 - 10.3 mg/dL Final   Total Protein 10/04/2021 7.0  6.5 - 8.1 g/dL Final   Albumin 10/04/2021 3.2 (L)  3.5 - 5.0 g/dL Final   AST 10/04/2021 13 (L)  15 - 41 U/L Final   ALT 10/04/2021 10  0 - 44 U/L Final   Alkaline Phosphatase  10/04/2021 61  38 - 126 U/L Final   Total Bilirubin 10/04/2021 0.3  0.3 - 1.2 mg/dL Final   GFR, Estimated 10/04/2021 >60  >60 mL/min Final   Comment: (NOTE) Calculated using the CKD-EPI Creatinine Equation (2021)    Anion gap 10/04/2021 7  5 - 15 Final   Performed at [facility] [address] [place], [place] [address]   Hgb A1c MFr Bld 10/04/2021 7.0 (H)  4.8 - 5.6 % Final   Comment:  (NOTE) Pre diabetes:          5.7%-6.4%  Diabetes:              >6.4%  Glycemic control for   <7.0% adults with diabetes    Mean Plasma Glucose 10/04/2021 154.2  mg/dL Final   Performed at [facility], [place] [address], [place], [place] [address]   Cholesterol 10/04/2021 178  0 - 200 mg/dL Final   Triglycerides 10/04/2021 155 (H)  <150 mg/dL Final   HDL 10/04/2021 52  >40 mg/dL Final   Total CHOL/HDL Ratio 10/04/2021 3.4  RATIO Final   VLDL 10/04/2021 31  0 - 40 mg/dL Final   LDL Cholesterol 10/04/2021 95  0 - 99 mg/dL Final   Comment:        Total Cholesterol/HDL:CHD Risk Coronary Heart Disease Risk Table                     Men   Women  1/2 Average Risk   3.4   3.3  Average Risk       5.0   4.4  2 X Average Risk   9.6   7.1  3 X Average Risk  23.4   11.0        Use the calculated Patient Ratio above and the CHD Risk Table to determine the patient's CHD Risk.        ATP III CLASSIFICATION (LDL):  <100     mg/dL   Optimal  100-129  mg/dL   Near or Above                    Optimal  130-159  mg/dL   Borderline  160-189  mg/dL   High  >190     mg/dL   Very High Performed at [facility] [address] [place], [place] [address]    POC Amphetamine UR 10/04/2021 None Detected  NONE DETECTED (Cut Off Level 1000 ng/mL) Final   POC Secobarbital (BAR) 10/04/2021 None Detected  NONE DETECTED (Cut Off Level 300 ng/mL) Final   POC Buprenorphine (BUP) 10/04/2021 None Detected  NONE DETECTED (Cut Off Level 10 ng/mL) Final   POC Oxazepam (BZO) 10/04/2021 None Detected  NONE DETECTED (Cut Off Level 300 ng/mL) Final   POC Cocaine UR 10/04/2021 None Detected  NONE DETECTED (Cut Off Level 300 ng/mL) Final   POC Methamphetamine UR 10/04/2021 None Detected  NONE DETECTED (Cut Off Level 1000 ng/mL) Final   POC Morphine 10/04/2021 None Detected  NONE DETECTED (Cut Off Level 300 ng/mL) Final   POC Methadone UR 10/04/2021 None Detected  NONE DETECTED (Cut Off Level 300 ng/mL) Final    POC Oxycodone UR 10/04/2021 Positive (A)  NONE DETECTED (Cut Off Level 100 ng/mL) Final   POC Marijuana UR 10/04/2021 None Detected  NONE DETECTED (Cut Off Level 50 ng/mL) Final   SARSCOV2ONAVIRUS 2 AG 10/04/2021 NEGATIVE  NEGATIVE Final   Comment: (NOTE) SARS-CoV-2 antigen  NOT DETECTED.   Negative results are presumptive.  Negative results do not preclude SARS-CoV-2 infection and should not be used as the sole basis for treatment or other patient management decisions, including infection  control decisions, particularly in the presence of clinical signs and  symptoms consistent with COVID-19, or in those who have been in contact with the virus.  Negative results must be combined with clinical observations, patient history, and epidemiological information. The expected result is Negative.  Fact Sheet for Patients: HandmadeRecipes.com.cy  Fact Sheet for Healthcare Providers: FuneralLife.at  This test is not yet approved or cleared by the [place] FDA and  has been authorized for detection and/or diagnosis of SARS-CoV-2 by FDA under an Emergency Use Authorization (EUA).  This EUA will remain in effect (meaning this test can be used) for the duration of  the COV                          ID-19 declaration under Section 564(b)(1) of the Act, 21 U.S.C. section 360bbb-3(b)(1), unless the authorization is terminated or revoked sooner.     Valproic Acid Lvl 10/04/2021 51  50.0 - 100.0 ug/mL Final   Performed at [facility] [address], [place], [place] [address]   Valproic Acid Lvl 10/08/2021 57  50.0 - 100.0 ug/mL Final   Performed at [facility] [address], [place], [place] [address]   Glucose-Capillary 10/09/2021 123 (H)  70 - 99 mg/dL Final   Glucose reference range applies only to samples taken after fasting for at least 8 hours.  Admission on 09/09/2021, Discharged on 09/10/2021  Component Date Value Ref  Range Status   Sodium 09/09/2021 134 (L)  135 - 145 mmol/L Final   Potassium 09/09/2021 4.3  3.5 - 5.1 mmol/L Final   Chloride 09/09/2021 99  98 - 111 mmol/L Final   CO2 09/09/2021 25  22 - 32 mmol/L Final   Glucose, Bld 09/09/2021 107 (H)  70 - 99 mg/dL Final   Glucose reference range applies only to samples taken after fasting for at least 8 hours.   BUN 09/09/2021 12  6 - 20 mg/dL Final   Creatinine, Ser 09/09/2021 0.74  0.44 - 1.00 mg/dL Final   Calcium 09/09/2021 9.0  8.9 - 10.3 mg/dL Final   Total Protein 09/09/2021 7.4  6.5 - 8.1 g/dL Final   Albumin 09/09/2021 3.1 (L)  3.5 - 5.0 g/dL Final   AST 09/09/2021 13 (L)  15 - 41 U/L Final   ALT 09/09/2021 13  0 - 44 U/L Final   Alkaline Phosphatase 09/09/2021 66  38 - 126 U/L Final   Total Bilirubin 09/09/2021 0.2 (L)  0.3 - 1.2 mg/dL Final   GFR, Estimated 09/09/2021 >60  >60 mL/min Final   Comment: (NOTE) Calculated using the CKD-EPI Creatinine Equation (2021)    Anion gap 09/09/2021 10  5 - 15 Final   Performed at [facility], [place] [address], [place], [place] [address]   WBC 09/09/2021 8.5  4.0 - 10.5 K/uL Final   RBC 09/09/2021 4.53  3.87 - 5.11 MIL/uL Final   Hemoglobin 09/09/2021 11.8 (L)  12.0 - 15.0 g/dL Final   HCT 09/09/2021 38.0  36.0 - 46.0 % Final   MCV 09/09/2021 83.9  80.0 - 100.0 fL Final   MCH 09/09/2021 26.0  26.0 - 34.0 pg Final   MCHC 09/09/2021 31.1  30.0 - 36.0 g/dL Final  RDW 09/09/2021 17.8 (H)  11.5 - 15.5 % Final   Platelets 09/09/2021 442 (H)  150 - 400 K/uL Final   nRBC 09/09/2021 0.0  0.0 - 0.2 % Final   Neutrophils Relative % 09/09/2021 70  % Final   Neutro Abs 09/09/2021 5.9  1.7 - 7.7 K/uL Final   Lymphocytes Relative 09/09/2021 20  % Final   Lymphs Abs 09/09/2021 1.7  0.7 - 4.0 K/uL Final   Monocytes Relative 09/09/2021 8  % Final   Monocytes Absolute 09/09/2021 0.7  0.1 - 1.0 K/uL Final   Eosinophils Relative 09/09/2021 1  % Final   Eosinophils Absolute 09/09/2021 0.1  0.0 - 0.5 K/uL  Final   Basophils Relative 09/09/2021 1  % Final   Basophils Absolute 09/09/2021 0.1  0.0 - 0.1 K/uL Final   Immature Granulocytes 09/09/2021 0  % Final   Abs Immature Granulocytes 09/09/2021 0.03  0.00 - 0.07 K/uL Final   Performed at [facility], [place] [address], [place], [place] [address]   Ammonia 09/09/2021 37 (H)  9 - 35 umol/L Final   Comment: HEMOLYSIS AT THIS LEVEL MAY AFFECT RESULT Performed at [facility], [place] [address], [place], [place] [address]    Opiates 09/09/2021 NONE DETECTED  NONE DETECTED Final   Cocaine 09/09/2021 NONE DETECTED  NONE DETECTED Final   Benzodiazepines 09/09/2021 NONE DETECTED  NONE DETECTED Final   Amphetamines 09/09/2021 NONE DETECTED  NONE DETECTED Final   Tetrahydrocannabinol 09/09/2021 NONE DETECTED  NONE DETECTED Final   Barbiturates 09/09/2021 NONE DETECTED  NONE DETECTED Final   Comment: (NOTE) DRUG SCREEN FOR MEDICAL PURPOSES ONLY.  IF CONFIRMATION IS NEEDED FOR ANY PURPOSE, NOTIFY LAB WITHIN 5 DAYS.  LOWEST DETECTABLE LIMITS FOR URINE DRUG SCREEN Drug Class                     Cutoff (ng/mL) Amphetamine and metabolites    1000 Barbiturate and metabolites    200 Benzodiazepine                 563 Tricyclics and metabolites     300 Opiates and metabolites        300 Cocaine and metabolites        300 THC                            50 Performed at [facility], [place] [address], [place], [place] [address]    Alcohol, Ethyl (B) 09/09/2021 <10  <10 mg/dL Final   Comment: (NOTE) Lowest detectable limit for serum alcohol is 10 mg/dL.  For medical purposes only. Performed at [facility], [place] [address], [place], [place] [address]    Lipase 09/09/2021 33  11 - 51 U/L Final   Performed at [facility] [address] [place], [place] [address]   Color, Urine 09/09/2021 COLORLESS (A)  YELLOW Final   APPearance 09/09/2021 CLEAR  CLEAR Final   Specific Gravity, Urine 09/09/2021 1.002 (L)  1.005 - 1.030  Final   pH 09/09/2021 6.0  5.0 - 8.0 Final   Glucose, UA 09/09/2021 NEGATIVE  NEGATIVE mg/dL Final   Hgb urine dipstick 09/09/2021 NEGATIVE  NEGATIVE Final   Bilirubin Urine 09/09/2021 NEGATIVE  NEGATIVE Final   Ketones, ur 09/09/2021 NEGATIVE  NEGATIVE mg/dL Final   Protein, ur 09/09/2021 NEGATIVE  NEGATIVE mg/dL Final   Nitrite 09/09/2021 NEGATIVE  NEGATIVE  Final   Leukocytes,Ua 09/09/2021 NEGATIVE  NEGATIVE Final   Performed at [facility], [place] [address] [place], [place] [address]   Specimen Description 09/09/2021 URINE, CLEAN CATCH   Final   Special Requests 09/09/2021 NONE   Final   Culture 09/09/2021  (A)   Final                   Value:<10,000 COLONIES/mL INSIGNIFICANT GROWTH Performed at [facility], [place] [address], [place], [place] [address]    Report Status 09/09/2021 09/10/2021 FINAL   Final   D-Dimer, Quant 09/09/2021 0.92 (H)  0.00 - 0.50 ug/mL-FEU Final   Comment: (NOTE) At the manufacturer cut-off value of 0.5 g/mL FEU, this assay has a negative predictive value of 95-100%.This assay is intended for use in conjunction with a clinical pretest probability (PTP) assessment model to exclude pulmonary embolism (PE) and deep venous thrombosis (DVT) in outpatients suspected of PE or DVT. Results should be correlated with clinical presentation. Performed at [facility], [place] [address] [place], [place] [address]    Troponin I (High Sensitivity) 09/09/2021 5  <18 ng/L Final   Comment: (NOTE) Elevated high sensitivity troponin I (hsTnI) values and significant  changes across serial measurements may suggest ACS but many other  chronic and acute conditions are known to elevate hsTnI results.  Refer to the "Links" section for chest pain algorithms and additional  guidance. Performed at [facility], [place] [address], [place], [place] [address]    BP 09/10/2021 135/83  mmHg Final   S' Lateral 09/10/2021 2.30  cm Final   Area-P 1/2 09/10/2021 5.58   cm2 Final    Blood Alcohol level:  Lab Results  Component Value Date   ETH <10 11/23/2021   ETH <10 69/67/8938    Metabolic Disorder Labs: Lab Results  Component Value Date   HGBA1C 6.7 (H) 12/28/2021   MPG 145.59 12/28/2021   MPG 154.2 10/04/2021   Lab Results  Component Value Date   PROLACTIN 3.8 (L) 11/23/2021   Lab Results  Component Value Date   CHOL 171 11/23/2021   TRIG 125 11/23/2021   HDL 65 11/23/2021   CHOLHDL 2.6 11/23/2021   VLDL 25 11/23/2021   LDLCALC 81 11/23/2021   LDLCALC 95 10/04/2021    Therapeutic Lab Levels: No results found for: "LITHIUM" Lab Results  Component Value Date   VALPROATE 33 (L) 01/18/2022   VALPROATE 33 (L) 12/28/2021   No results found for: "CBMZ"  Physical Findings   PHQ2-9    Flowsheet Row ED from 11/23/2021 in [facility]  PHQ-2 Total Score 2  PHQ-9 Total Score 3      Flowsheet Row ED from 02/09/2022 in [facility] ED from 02/08/2022 in [place] ED from 01/24/2022 in [place] No Risk No Risk No Risk        Musculoskeletal  Strength & Muscle Tone: within normal limits Gait & Station: normal Patient leans: N/A  Psychiatric Specialty Exam  Presentation  General Appearance:  Appropriate for Environment  Eye Contact: Good  Speech: Normal Rate  Speech Volume: Normal  Handedness: Right   Mood and Affect  Mood: Euthymic  Affect: Appropriate   Thought Process  Thought Processes: Goal Directed  Descriptions of Associations:Intact  Orientation:Full (Time, Place and Person)  Thought Content:WDL  Diagnosis of Schizophrenia or Schizoaffective disorder in  past: Yes  Duration of Psychotic Symptoms: No data recorded  Hallucinations:Hallucinations: None  Ideas of Reference:None  Suicidal Thoughts:Suicidal Thoughts: No  Homicidal  Thoughts:Homicidal Thoughts: No   Sensorium  Memory: Immediate Fair  Judgment: Intact  Insight: Present   Executive Functions  Concentration: Fair  Attention Span: Good  Recall: [place] of Knowledge: Fair  Language: Good   Psychomotor Activity  Psychomotor Activity: Psychomotor Activity: Normal   Assets  Assets: Communication Skills; Desire for Improvement; Financial Resources/Insurance   Sleep  Sleep: Sleep: Good   Nutritional Assessment (For OBS and [facility] admissions only) Has the patient had a weight loss or gain of 10 pounds or more in the last 3 months?: No Has the patient had a decrease in food intake/or appetite?: No Does the patient have dental problems?: No Does the patient have eating habits or behaviors that may be indicators of an eating disorder including binging or inducing vomiting?: No Has the patient recently lost weight without trying?: 0 Has the patient been eating poorly because of a decreased appetite?: 0 Malnutrition Screening Tool Score: 0    Physical Exam  Physical Exam Constitutional:      Appearance: Normal appearance.  HENT:     Head: Normocephalic and atraumatic.  Cardiovascular:     Rate and Rhythm: Normal rate and regular rhythm.  Pulmonary:     Effort: Pulmonary effort is normal.  Musculoskeletal:        General: Normal range of motion.  Skin:    General: Skin is warm and dry.  Neurological:     General: No focal deficit present.     Mental Status: She is alert and oriented to person, place, and time. Mental status is at baseline.    Review of Systems  Respiratory:  Negative for shortness of breath.   Cardiovascular:  Negative for chest pain.  Gastrointestinal:  Negative for abdominal pain, constipation, diarrhea, heartburn, nausea and vomiting.  Neurological:  Negative for headaches.   Blood pressure 115/66, pulse (!) 108, temperature (!) 97.5 F (36.4 C), temperature source Oral, resp. rate 20, SpO2  100 %. There is no height or weight on file to calculate BMI.  Treatment Plan Summary: Daily contact with patient to assess and evaluate symptoms and progress in treatment and Medication management  Medications were restarted on admission with the inclusion of colchicine for pericardial effusion. Appreciate IM recs upon their assessment yesterday.  Adjusted medications to match what she was taking at [facility] -Eliquis 5 mg p.o. twice daily A-fib -Abilify 10 mg p.o. daily mood -Vitamin D3 50,000 units p.o. every Thursday Vit D deficiency -Clozaril 75 mg p.o. nightly Schizophrenia -Clozaril 50 mg p.o. 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 daily A-fib -Depakote ER 500 mg p.o. twice daily mood -Haldol 10 mg p.o. 3 times daily as needed agitation, psychotic symptoms -Diabetes mellitus care plan insulin protocol before meals and at bedtime -Ingrezza 40 mg p.o. daily, for Tardive dyskinesia -Metformin 500 mg p.o. twice daily with meals type 2 diabetes -Ferrous sulphate 325 mg Po daily with breakfast for iron deficiency   Other PRNs -Tylenol 650 mg p.o. every 6 hours as needed pain -Maalox 30 mm p.o. every 4 hours as needed indigestion -Atarax 25 mg p.o. 3 times daily as needed anxiety -MOM 30 mL p.o. daily as needed constipation -Cepacol lozenge 3 mg p.o. as needed sore throat -Trazodone 50 mg p.o. nightly as needed sleep  France Ravens, MD 02/10/2022 8:21 AM

## 2022-02-10 NOTE — Progress Notes (Signed)
Teresa Murillo remained in good spirits, ate dinner and compliant with her medications.

## 2022-02-10 NOTE — Progress Notes (Signed)
PHARMACIST - PHYSICIAN ORDER COMMUNICATION  Solaris Kram is a 61 y.o. year old female with a history of schizophrenia on Clozapine PTA. Continuing this medication order as an inpatient requires that monitoring parameters per REMS requirements must be met.   Clozapine REMS Dispense Authorization was obtained with RDA# [id number]  Last ANC value and date reported on the Clozapine REMS website: 02/09/22  5400. Next Bearden due 12/19  [name] 02/10/2022, 9:26 AM

## 2022-02-10 NOTE — ED Notes (Signed)
Pt is sleeping. No distress noted. Will continue to monitor safety. 

## 2022-02-10 NOTE — ED Notes (Signed)
Patient in bed resting, watching TV. Calm and cooperative at this time. No acute distress noted at this time.

## 2022-02-10 NOTE — ED Notes (Signed)
Pt was given dinner. 

## 2022-02-10 NOTE — ED Notes (Signed)
Pt is in the bed resting. Respirations are even and unlabored. No acute distress noted. Will continue to monitor for safety 

## 2022-02-11 DIAGNOSIS — F209 Schizophrenia, unspecified: Secondary | ICD-10-CM | POA: Diagnosis not present

## 2022-02-11 LAB — GLUCOSE, CAPILLARY
Glucose-Capillary: 158 mg/dL — ABNORMAL HIGH (ref 70–99)
Glucose-Capillary: 159 mg/dL — ABNORMAL HIGH (ref 70–99)
Glucose-Capillary: 159 mg/dL — ABNORMAL HIGH (ref 70–99)
Glucose-Capillary: 153 mg/dL — ABNORMAL HIGH (ref 70–99)
Glucose-Capillary: 153 mg/dL — ABNORMAL HIGH (ref 70–99)
Glucose-Capillary: 109 mg/dL — ABNORMAL HIGH (ref 70–99)
Glucose-Capillary: 213 mg/dL — ABNORMAL HIGH (ref 70–99)
Glucose-Capillary: 229 mg/dL — ABNORMAL HIGH (ref 70–99)

## 2022-02-11 MED ORDER — SALINE SPRAY 0.65 % NA SOLN: 1.0000 | NASAL | Status: DC | PRN

## 2022-02-11 MED ORDER — SALINE SPRAY 0.65 % NA SOLN: 1.0000 | Freq: Once | NASAL | Status: DC

## 2022-02-11 NOTE — ED Notes (Signed)
Pt A&O x 4, awake & resting at present, no distress noted, calm & cooperative.  Monitoring for safety.

## 2022-02-11 NOTE — ED Provider Notes (Addendum)
Behavioral Health Progress Note  Date and Time: 02/11/2022 1:07 PM Name: Teresa Murillo MRN:  [id number]  Reason for Admission: Teresa Murillo is a 61 year old female with a past psychiatric history significant for schizophrenia, aggressive behavior, and possible intellectual disability who presented to [facility] on 11/23/2021 as a walk-in. At the time, patient had been dismissed from the group home due to multiple elopements. Patient has remained at [facility] while awaiting new placement. Social work and DSS are involved.   Subjective:  Seen and assessed at bedside. Denies SI/HI/AVH. Denies chest pain. States she feels better today since it is her 61th birthday. She denies any somatic complaints beyond mild congestion. Offered OCEAN spray and patient was agreeable to this. Denies cough, fever, chills, headache, n/v. Denies GI symptoms. Patient has no other questions at this time. LCSW continuing to assist with patient's dispo.  Diagnosis:  Final diagnoses:  At risk for self care deficit  Schizophrenia, unspecified type ([place])  Noncompliance with medication regimen  Borderline intellectual functioning    Total Time spent with patient: 45 minutes  Past Psychiatric History: see H&P Past Medical History:  Past Medical History:  Diagnosis Date   Borderline intellectual functioning 12/14/2021   On 12/14/2021: Appreciate assistance from psychology consult. On the Wechsler Adult Intelligence Scale-4, Ms. Benedicto achieved a full-scale IQ score of 73 and a percentile rank of 4 placing her in the borderline range of intellectual functioning.    Chronic obstructive pulmonary disease (COPD) (HCC)    Glaucoma    Hyperlipidemia    Hypertension    Iron deficiency    Schizoaffective disorder (HCC)    Type 2 diabetes mellitus (HCC)     Past Surgical History:  Procedure Laterality Date   TUBAL LIGATION     Family History:  Family History  Problem Relation Age of Onset   Breast cancer Maternal  Grandmother    Family Psychiatric  History: see H&P Social History:  Social History   Substance and Sexual Activity  Alcohol Use Not Currently     Social History   Substance and Sexual Activity  Drug Use Not Currently    Social History   Socioeconomic History   Marital status: Divorced    Spouse name: Not on file   Number of children: Not on file   Years of education: Not on file   Highest education level: Not on file  Occupational History   Not on file  Tobacco Use   Smoking status: Former    Packs/day: 1.00    Types: Cigars, Cigarettes    Quit date: 11/09/2021    Years since quitting: 0.2   Smokeless tobacco: Current  Vaping Use   Vaping Use: Never used  Substance and Sexual Activity   Alcohol use: Not Currently   Drug use: Not Currently   Sexual activity: Not Currently    Birth control/protection: Surgical  Other Topics Concern   Not on file  Social History Narrative   Not on file   Social Determinants of Health   Financial Resource Strain: Not on file  Food Insecurity: Not on file  Transportation Needs: Not on file  Physical Activity: Not on file  Stress: Not on file  Social Connections: Not on file   SDOH:  SDOH Screenings   Depression (PHQ2-9): Low Risk  (11/23/2021)  Tobacco Use: High Risk (02/08/2022)   Additional Social History:  Sleep: Fair  Appetite:  Fair  Current Medications:  Current Facility-Administered Medications  Medication Dose Route Frequency Provider Last Rate Last Admin   acetaminophen (TYLENOL) tablet 650 mg  650 mg Oral Q6H PRN Onuoha, Chinwendu V, NP   650 mg at 02/10/22 2139   alum & mag hydroxide-simeth (MAALOX/MYLANTA) 200-200-20 MG/5ML suspension 30 mL  30 mL Oral Q4H PRN Onuoha, Chinwendu V, NP   30 mL at 02/10/22 0133   apixaban (ELIQUIS) tablet 5 mg  5 mg Oral BID Onuoha, Chinwendu V, NP   5 mg at 02/11/22 0902   ARIPiprazole (ABILIFY) tablet 10 mg  10 mg Oral Daily Onuoha, Chinwendu  V, NP   10 mg at 02/11/22 0902   cloZAPine (CLOZARIL) tablet 50 mg  50 mg Oral Daily France Ravens, MD   50 mg at 02/11/22 0903   cloZAPine (CLOZARIL) tablet 75 mg  75 mg Oral Aliene Altes, MD   75 mg at 02/10/22 2139   colchicine tablet 0.6 mg  0.6 mg Oral QHS Onuoha, Chinwendu V, NP   0.6 mg at 02/10/22 2140   diltiazem (CARDIZEM CD) 24 hr capsule 240 mg  240 mg Oral Daily Onuoha, Chinwendu V, NP   240 mg at 02/11/22 0902   divalproex (DEPAKOTE ER) 24 hr tablet 500 mg  500 mg Oral BID Onuoha, Chinwendu V, NP   500 mg at 02/11/22 0903   ferrous sulfate tablet 325 mg  325 mg Oral Q breakfast Onuoha, Chinwendu V, NP   325 mg at 02/11/22 0756   haloperidol (HALDOL) tablet 10 mg  10 mg Oral TID PRN Onuoha, Chinwendu V, NP       hydrOXYzine (ATARAX) tablet 25 mg  25 mg Oral TID PRN Onuoha, Chinwendu V, NP       insulin aspart (novoLOG) injection 0-5 Units  0-5 Units Subcutaneous QHS Onuoha, Chinwendu V, NP       insulin aspart (novoLOG) injection 0-9 Units  0-9 Units Subcutaneous TID WC Onuoha, Chinwendu V, NP   2 Units at 02/11/22 0756   magnesium hydroxide (MILK OF MAGNESIA) suspension 30 mL  30 mL Oral Daily PRN Onuoha, Chinwendu V, NP       menthol-cetylpyridinium (CEPACOL) lozenge 3 mg  1 lozenge Oral PRN Onuoha, Chinwendu V, NP   3 mg at 02/10/22 0544   metFORMIN (GLUCOPHAGE) tablet 500 mg  500 mg Oral BID WC Onuoha, Chinwendu V, NP   500 mg at 02/11/22 0756   sodium chloride (OCEAN) 0.65 % nasal spray 1 spray  1 spray Each Nare PRN France Ravens, MD       traZODone (DESYREL) tablet 50 mg  50 mg Oral QHS PRN Onuoha, Chinwendu V, NP   50 mg at 02/09/22 2201   valbenazine (INGREZZA) capsule 40 mg  40 mg Oral q AM Onuoha, Chinwendu V, NP   40 mg at 02/11/22 0617   Vitamin D (Ergocalciferol) (DRISDOL) 1.25 MG (50000 UNIT) capsule 50,000 Units  50,000 Units Oral Q7 days Onuoha, Chinwendu V, NP   50,000 Units at 02/11/22 0902   Current Outpatient Medications  Medication Sig Dispense Refill   Accu-Chek  Softclix Lancets lancets Use as directed up to four times daily 100 each 0   apixaban (ELIQUIS) 5 MG TABS tablet Take 1 tablet (5 mg total) by mouth 2 (two) times daily. 60 tablet 0   ARIPiprazole (ABILIFY) 10 MG tablet Take 1 tablet (10 mg total) by mouth daily. 30 tablet 0   Blood Glucose  Monitoring Suppl (ACCU-CHEK GUIDE) w/Device KIT Use as directed up to four times daily 1 kit 0   budesonide-formoterol (SYMBICORT) 160-4.5 MCG/ACT inhaler Inhale 2 puffs into the lungs in the morning and at bedtime.     Cholecalciferol (VITAMIN D3) 1.25 MG (50000 UT) CAPS Take 50,000 Units by mouth every Thursday.     clozapine (CLOZARIL) 50 MG tablet Take 1 tablet (50 mg total) by mouth daily. 30 tablet 0   colchicine 0.6 MG tablet Take 1 tablet (0.6 mg total) by mouth at bedtime. 30 tablet 0   diltiazem (CARDIZEM CD) 240 MG 24 hr capsule Take 1 capsule (240 mg total) by mouth daily. (Patient not taking: Reported on 11/24/2021) 30 capsule 0   divalproex (DEPAKOTE ER) 500 MG 24 hr tablet Take 1 tablet (500 mg total) by mouth 2 (two) times daily. 60 tablet 0   glucose blood test strip Use as directed up to four times daily 50 each 0   haloperidol (HALDOL) 10 MG tablet Take 1 tablet (10 mg total) by mouth 3 (three) times daily as needed for agitation (and psychotic symptoms).     INGREZZA 40 MG capsule Take 1 capsule (40 mg total) by mouth in the morning. 30 capsule 0   insulin aspart (NOVOLOG) 100 UNIT/ML FlexPen Before each meal 3 times a day, 140-199 - 2 units, 200-250 - 4 units, 251-299 - 6 units,  300-349 - 8 units,  350 or above 10 units. Insulin PEN if approved, provide syringes and needles if needed.Please switch to any approved short acting Insulin if needed. 15 mL 0   insulin glargine (LANTUS) 100 UNIT/ML Solostar Pen Inject 12 Units into the skin daily. 15 mL 0   Insulin Pen Needle 32G X 4 MM MISC Use 4 times a day with insulin, 1 month supply. 100 each 0   latanoprost (XALATAN) 0.005 % ophthalmic  solution Place 1 drop into both eyes at bedtime.     metFORMIN (GLUCOPHAGE) 500 MG tablet Take 500 mg by mouth 2 (two) times daily with a meal.     nicotine (NICODERM CQ - DOSED IN MG/24 HR) 7 mg/24hr patch Place 1 patch (7 mg total) onto the skin daily. 28 patch 0   PROAIR HFA 108 (90 Base) MCG/ACT inhaler Inhale 2 puffs into the lungs every 6 (six) hours as needed for wheezing or shortness of breath.      Labs  Lab Results:  Admission on 02/09/2022  Component Date Value Ref Range Status   Glucose-Capillary 02/09/2022 118 (H)  70 - 99 mg/dL Final   Glucose reference range applies only to samples taken after fasting for at least 8 hours.   Glucose-Capillary 02/10/2022 148 (H)  70 - 99 mg/dL Final   Glucose reference range applies only to samples taken after fasting for at least 8 hours.   Glucose-Capillary 02/10/2022 174 (H)  70 - 99 mg/dL Final   Glucose reference range applies only to samples taken after fasting for at least 8 hours.   Glucose-Capillary 02/10/2022 128 (H)  70 - 99 mg/dL Final   Glucose reference range applies only to samples taken after fasting for at least 8 hours.   Glucose-Capillary 02/10/2022 182 (H)  70 - 99 mg/dL Final   Glucose reference range applies only to samples taken after fasting for at least 8 hours.   Glucose-Capillary 02/11/2022 158 (H)  70 - 99 mg/dL Final   Glucose reference range applies only to samples taken after fasting for at least  8 hours.   Glucose-Capillary 02/11/2022 159 (H)  70 - 99 mg/dL Final   Glucose reference range applies only to samples taken after fasting for at least 8 hours.   Glucose-Capillary 02/11/2022 153 (H)  70 - 99 mg/dL Final   Glucose reference range applies only to samples taken after fasting for at least 8 hours.   Glucose-Capillary 02/11/2022 159 (H)  70 - 99 mg/dL Final   Glucose reference range applies only to samples taken after fasting for at least 8 hours.   Glucose-Capillary 02/11/2022 153 (H)  70 - 99 mg/dL Final    Glucose reference range applies only to samples taken after fasting for at least 8 hours.  Admission on 02/08/2022, Discharged on 02/09/2022  Component Date Value Ref Range Status   B Natriuretic Peptide 02/08/2022 40.2  0.0 - 100.0 pg/mL Final   Performed at [facility], [address], [place], [place] [address]   Troponin I (High Sensitivity) 02/08/2022 4  <18 ng/L Final   Comment: (NOTE) Elevated high sensitivity troponin I (hsTnI) values and significant  changes across serial measurements may suggest ACS but many other  chronic and acute conditions are known to elevate hsTnI results.  Refer to the "Links" section for chest pain algorithms and additional  guidance. Performed at [facility], [place] [address], [place], [place] [address]    WBC 02/08/2022 8.5  4.0 - 10.5 K/uL Final   RBC 02/08/2022 4.30  3.87 - 5.11 MIL/uL Final   Hemoglobin 02/08/2022 10.7 (L)  12.0 - 15.0 g/dL Final   HCT 02/08/2022 33.1 (L)  36.0 - 46.0 % Final   MCV 02/08/2022 77.0 (L)  80.0 - 100.0 fL Final   MCH 02/08/2022 24.9 (L)  26.0 - 34.0 pg Final   MCHC 02/08/2022 32.3  30.0 - 36.0 g/dL Final   RDW 02/08/2022 16.5 (H)  11.5 - 15.5 % Final   Platelets 02/08/2022 361  150 - 400 K/uL Final   nRBC 02/08/2022 0.0  0.0 - 0.2 % Final   Performed at [facility] [address] [place], [place] [address]   Sodium 02/08/2022 131 (L)  135 - 145 mmol/L Final   Potassium 02/08/2022 4.4  3.5 - 5.1 mmol/L Final   Chloride 02/08/2022 96 (L)  98 - 111 mmol/L Final   CO2 02/08/2022 25  22 - 32 mmol/L Final   Glucose, Bld 02/08/2022 126 (H)  70 - 99 mg/dL Final   Glucose reference range applies only to samples taken after fasting for at least 8 hours.   BUN 02/08/2022 11  6 - 20 mg/dL Final   Creatinine, Ser 02/08/2022 0.52  0.44 - 1.00 mg/dL Final   Calcium 02/08/2022 9.4  8.9 - 10.3 mg/dL Final   Total Protein 02/08/2022 7.5  6.5 - 8.1 g/dL Final   Albumin 02/08/2022 3.6  3.5 - 5.0 g/dL Final    AST 02/08/2022 22  15 - 41 U/L Final   ALT 02/08/2022 20  0 - 44 U/L Final   Alkaline Phosphatase 02/08/2022 67  38 - 126 U/L Final   Total Bilirubin 02/08/2022 0.1 (L)  0.3 - 1.2 mg/dL Final   GFR, Estimated 02/08/2022 >60  >60 mL/min Final   Comment: (NOTE) Calculated using the CKD-EPI Creatinine Equation (2021)    Anion gap 02/08/2022 10  5 - 15 Final   Performed at [facility] [address], [place], [place] [address]   Troponin I (High  Sensitivity) 02/08/2022 5  <18 ng/L Final   Comment: (NOTE) Elevated high sensitivity troponin I (hsTnI) values and significant  changes across serial measurements may suggest ACS but many other  chronic and acute conditions are known to elevate hsTnI results.  Refer to the "Links" section for chest pain algorithms and additional  guidance. Performed at [facility], [place] [address] [place], [place] [address]    WBC 02/09/2022 7.3  4.0 - 10.5 K/uL Final   RBC 02/09/2022 4.70  3.87 - 5.11 MIL/uL Final   Hemoglobin 02/09/2022 11.4 (L)  12.0 - 15.0 g/dL Final   HCT 02/09/2022 36.6  36.0 - 46.0 % Final   MCV 02/09/2022 77.9 (L)  80.0 - 100.0 fL Final   MCH 02/09/2022 24.3 (L)  26.0 - 34.0 pg Final   MCHC 02/09/2022 31.1  30.0 - 36.0 g/dL Final   RDW 02/09/2022 16.6 (H)  11.5 - 15.5 % Final   Platelets 02/09/2022 403 (H)  150 - 400 K/uL Final   nRBC 02/09/2022 0.0  0.0 - 0.2 % Final   Performed at [facility] [address], [place], [place] [address]   Glucose-Capillary 02/08/2022 117 (H)  70 - 99 mg/dL Final   Glucose reference range applies only to samples taken after fasting for at least 8 hours.   Sodium 02/09/2022 138  135 - 145 mmol/L Final   Potassium 02/09/2022 3.7  3.5 - 5.1 mmol/L Final   Chloride 02/09/2022 103  98 - 111 mmol/L Final   CO2 02/09/2022 24  22 - 32 mmol/L Final   Glucose, Bld 02/09/2022 134 (H)  70 - 99 mg/dL Final   Glucose reference range applies only to samples taken after fasting for at least  8 hours.   BUN 02/09/2022 9  6 - 20 mg/dL Final   Creatinine, Ser 02/09/2022 0.44  0.44 - 1.00 mg/dL Final   Calcium 02/09/2022 8.3 (L)  8.9 - 10.3 mg/dL Final   GFR, Estimated 02/09/2022 >60  >60 mL/min Final   Comment: (NOTE) Calculated using the CKD-EPI Creatinine Equation (2021)    Anion gap 02/09/2022 11  5 - 15 Final   Performed at [facility], [place] [address], [place], [place] [address]   Iron 02/09/2022 20 (L)  28 - 170 ug/dL Final   TIBC 02/09/2022 455 (H)  250 - 450 ug/dL Final   Saturation Ratios 02/09/2022 4 (L)  10.4 - 31.8 % Final   UIBC 02/09/2022 435  ug/dL Final   Performed at [facility], [place] [address], [place], [place] [address]   Ferritin 02/09/2022 2 (L)  11 - 307 ng/mL Final   Performed at [facility] [address], [place], [place] [address]   Weight 02/09/2022 2,846.58  oz Final   Height 02/09/2022 62  in Final   BP 02/09/2022 143/80  mmHg Final   WBC 02/09/2022 8.1  4.0 - 10.5 K/uL Final   RBC 02/09/2022 4.38  3.87 - 5.11 MIL/uL Final   Hemoglobin 02/09/2022 10.9 (L)  12.0 - 15.0 g/dL Final   HCT 02/09/2022 33.3 (L)  36.0 - 46.0 % Final   MCV 02/09/2022 76.0 (L)  80.0 - 100.0 fL Final   MCH 02/09/2022 24.9 (L)  26.0 - 34.0 pg Final   MCHC 02/09/2022 32.7  30.0 - 36.0 g/dL Final   RDW 02/09/2022 16.5 (H)  11.5 - 15.5 % Final   Platelets 02/09/2022 397  150 - 400  K/uL Final   nRBC 02/09/2022 0.0  0.0 - 0.2 % Final   Neutrophils Relative % 02/09/2022 66  % Final   Neutro Abs 02/09/2022 5.4  1.7 - 7.7 K/uL Final   Lymphocytes Relative 02/09/2022 24  % Final   Lymphs Abs 02/09/2022 1.9  0.7 - 4.0 K/uL Final   Monocytes Relative 02/09/2022 8  % Final   Monocytes Absolute 02/09/2022 0.6  0.1 - 1.0 K/uL Final   Eosinophils Relative 02/09/2022 1  % Final   Eosinophils Absolute 02/09/2022 0.1  0.0 - 0.5 K/uL Final   Basophils Relative 02/09/2022 1  % Final   Basophils Absolute 02/09/2022 0.1  0.0 - 0.1 K/uL Final   Immature Granulocytes  02/09/2022 0  % Final   Abs Immature Granulocytes 02/09/2022 0.03  0.00 - 0.07 K/uL Final   Performed at [facility], [place] [address], [place], [place] [address]   Glucose-Capillary 02/09/2022 238 (H)  70 - 99 mg/dL Final   Glucose reference range applies only to samples taken after fasting for at least 8 hours.   Glucose-Capillary 02/09/2022 91  70 - 99 mg/dL Final   Glucose reference range applies only to samples taken after fasting for at least 8 hours.   CRP 02/09/2022 <0.5  <1.0 mg/dL Final   Performed at [facility] [address], [place], [place] [address]   Sed Rate 02/09/2022 15  0 - 22 mm/hr Final   Performed at [facility] [address] [place], [place] [address]   Glucose-Capillary 02/09/2022 137 (H)  70 - 99 mg/dL Final   Glucose reference range applies only to samples taken after fasting for at least 8 hours.  Admission on 01/24/2022, Discharged on 02/08/2022  Component Date Value Ref Range Status   Glucose-Capillary 01/24/2022 143 (H)  70 - 99 mg/dL Final   Glucose reference range applies only to samples taken after fasting for at least 8 hours.   Glucose-Capillary 01/24/2022 120 (H)  70 - 99 mg/dL Final   Glucose reference range applies only to samples taken after fasting for at least 8 hours.   Glucose-Capillary 01/25/2022 122 (H)  70 - 99 mg/dL Final   Glucose reference range applies only to samples taken after fasting for at least 8 hours.   Glucose-Capillary 01/25/2022 190 (H)  70 - 99 mg/dL Final   Glucose reference range applies only to samples taken after fasting for at least 8 hours.   Glucose-Capillary 01/25/2022 163 (H)  70 - 99 mg/dL Final   Glucose reference range applies only to samples taken after fasting for at least 8 hours.   WBC 01/26/2022 6.5  4.0 - 10.5 K/uL Final   RBC 01/26/2022 4.53  3.87 - 5.11 MIL/uL Final   Hemoglobin 01/26/2022 11.3 (L)  12.0 - 15.0 g/dL Final   HCT 01/26/2022 36.4  36.0 - 46.0 % Final   MCV 01/26/2022  80.4  80.0 - 100.0 fL Final   MCH 01/26/2022 24.9 (L)  26.0 - 34.0 pg Final   MCHC 01/26/2022 31.0  30.0 - 36.0 g/dL Final   RDW 01/26/2022 17.3 (H)  11.5 - 15.5 % Final   Platelets 01/26/2022 327  150 - 400 K/uL Final   nRBC 01/26/2022 0.0  0.0 - 0.2 % Final   Neutrophils Relative % 01/26/2022 60  % Final   Neutro Abs 01/26/2022 3.9  1.7 - 7.7 K/uL Final   Lymphocytes Relative 01/26/2022 31  % Final  Lymphs Abs 01/26/2022 2.0  0.7 - 4.0 K/uL Final   Monocytes Relative 01/26/2022 7  % Final   Monocytes Absolute 01/26/2022 0.5  0.1 - 1.0 K/uL Final   Eosinophils Relative 01/26/2022 1  % Final   Eosinophils Absolute 01/26/2022 0.1  0.0 - 0.5 K/uL Final   Basophils Relative 01/26/2022 1  % Final   Basophils Absolute 01/26/2022 0.1  0.0 - 0.1 K/uL Final   Immature Granulocytes 01/26/2022 0  % Final   Abs Immature Granulocytes 01/26/2022 0.02  0.00 - 0.07 K/uL Final   Performed at [facility], [place] [address] [place], [place] [address]   Glucose-Capillary 01/26/2022 149 (H)  70 - 99 mg/dL Final   Glucose reference range applies only to samples taken after fasting for at least 8 hours.   Glucose-Capillary 01/26/2022 130 (H)  70 - 99 mg/dL Final   Glucose reference range applies only to samples taken after fasting for at least 8 hours.   Glucose-Capillary 01/26/2022 151 (H)  70 - 99 mg/dL Final   Glucose reference range applies only to samples taken after fasting for at least 8 hours.   Glucose-Capillary 01/26/2022 170 (H)  70 - 99 mg/dL Final   Glucose reference range applies only to samples taken after fasting for at least 8 hours.   Glucose-Capillary 01/27/2022 129 (H)  70 - 99 mg/dL Final   Glucose reference range applies only to samples taken after fasting for at least 8 hours.   Glucose-Capillary 01/27/2022 101 (H)  70 - 99 mg/dL Final   Glucose reference range applies only to samples taken after fasting for at least 8 hours.   Glucose-Capillary 01/27/2022 220 (H)  70 - 99 mg/dL  Final   Glucose reference range applies only to samples taken after fasting for at least 8 hours.   Glucose-Capillary 01/27/2022 154 (H)  70 - 99 mg/dL Final   Glucose reference range applies only to samples taken after fasting for at least 8 hours.   Glucose-Capillary 01/27/2022 163 (H)  70 - 99 mg/dL Final   Glucose reference range applies only to samples taken after fasting for at least 8 hours.   Glucose-Capillary 01/28/2022 127 (H)  70 - 99 mg/dL Final   Glucose reference range applies only to samples taken after fasting for at least 8 hours.   Glucose-Capillary 01/28/2022 110 (H)  70 - 99 mg/dL Final   Glucose reference range applies only to samples taken after fasting for at least 8 hours.   Glucose-Capillary 01/28/2022 167 (H)  70 - 99 mg/dL Final   Glucose reference range applies only to samples taken after fasting for at least 8 hours.   Glucose-Capillary 01/29/2022 145 (H)  70 - 99 mg/dL Final   Glucose reference range applies only to samples taken after fasting for at least 8 hours.   Glucose-Capillary 01/29/2022 203 (H)  70 - 99 mg/dL Final   Glucose reference range applies only to samples taken after fasting for at least 8 hours.   Glucose-Capillary 01/29/2022 153 (H)  70 - 99 mg/dL Final   Glucose reference range applies only to samples taken after fasting for at least 8 hours.   Glucose-Capillary 01/29/2022 166 (H)  70 - 99 mg/dL Final   Glucose reference range applies only to samples taken after fasting for at least 8 hours.   Glucose-Capillary 01/30/2022 142 (H)  70 - 99 mg/dL Final   Glucose reference range applies only to samples taken after fasting for  at least 8 hours.   Glucose-Capillary 01/30/2022 138 (H)  70 - 99 mg/dL Final   Glucose reference range applies only to samples taken after fasting for at least 8 hours.   Glucose-Capillary 01/30/2022 185 (H)  70 - 99 mg/dL Final   Glucose reference range applies only to samples taken after fasting for at least 8 hours.    Glucose-Capillary 01/30/2022 220 (H)  70 - 99 mg/dL Final   Glucose reference range applies only to samples taken after fasting for at least 8 hours.   Glucose-Capillary 01/31/2022 147 (H)  70 - 99 mg/dL Final   Glucose reference range applies only to samples taken after fasting for at least 8 hours.   Glucose-Capillary 01/31/2022 131 (H)  70 - 99 mg/dL Final   Glucose reference range applies only to samples taken after fasting for at least 8 hours.   Glucose-Capillary 01/31/2022 169 (H)  70 - 99 mg/dL Final   Glucose reference range applies only to samples taken after fasting for at least 8 hours.   Glucose-Capillary 01/31/2022 144 (H)  70 - 99 mg/dL Final   Glucose reference range applies only to samples taken after fasting for at least 8 hours.   Comment 1 01/31/2022 RN AWARE   Final   Glucose-Capillary 02/01/2022 117 (H)  70 - 99 mg/dL Final   Glucose reference range applies only to samples taken after fasting for at least 8 hours.   Glucose-Capillary 02/01/2022 180 (H)  70 - 99 mg/dL Final   Glucose reference range applies only to samples taken after fasting for at least 8 hours.   Glucose-Capillary 02/01/2022 209 (H)  70 - 99 mg/dL Final   Glucose reference range applies only to samples taken after fasting for at least 8 hours.   Glucose-Capillary 02/02/2022 131 (H)  70 - 99 mg/dL Final   Glucose reference range applies only to samples taken after fasting for at least 8 hours.   WBC 02/02/2022 7.5  4.0 - 10.5 K/uL Final   RBC 02/02/2022 4.52  3.87 - 5.11 MIL/uL Final   Hemoglobin 02/02/2022 11.3 (L)  12.0 - 15.0 g/dL Final   HCT 02/02/2022 35.0 (L)  36.0 - 46.0 % Final   MCV 02/02/2022 77.4 (L)  80.0 - 100.0 fL Final   MCH 02/02/2022 25.0 (L)  26.0 - 34.0 pg Final   MCHC 02/02/2022 32.3  30.0 - 36.0 g/dL Final   RDW 02/02/2022 16.7 (H)  11.5 - 15.5 % Final   Platelets 02/02/2022 345  150 - 400 K/uL Final   nRBC 02/02/2022 0.0  0.0 - 0.2 % Final   Neutrophils Relative % 02/02/2022  63  % Final   Neutro Abs 02/02/2022 4.7  1.7 - 7.7 K/uL Final   Lymphocytes Relative 02/02/2022 28  % Final   Lymphs Abs 02/02/2022 2.1  0.7 - 4.0 K/uL Final   Monocytes Relative 02/02/2022 7  % Final   Monocytes Absolute 02/02/2022 0.5  0.1 - 1.0 K/uL Final   Eosinophils Relative 02/02/2022 1  % Final   Eosinophils Absolute 02/02/2022 0.1  0.0 - 0.5 K/uL Final   Basophils Relative 02/02/2022 1  % Final   Basophils Absolute 02/02/2022 0.1  0.0 - 0.1 K/uL Final   Immature Granulocytes 02/02/2022 0  % Final   Abs Immature Granulocytes 02/02/2022 0.03  0.00 - 0.07 K/uL Final   Performed at [facility], [place] [address], [place], [place] [address]   Glucose-Capillary 02/02/2022 95  70 -  99 mg/dL Final   Glucose reference range applies only to samples taken after fasting for at least 8 hours.   Glucose-Capillary 02/02/2022 120 (H)  70 - 99 mg/dL Final   Glucose reference range applies only to samples taken after fasting for at least 8 hours.   Glucose-Capillary 02/02/2022 191 (H)  70 - 99 mg/dL Final   Glucose reference range applies only to samples taken after fasting for at least 8 hours.   Glucose-Capillary 02/03/2022 148 (H)  70 - 99 mg/dL Final   Glucose reference range applies only to samples taken after fasting for at least 8 hours.   Glucose-Capillary 02/03/2022 122 (H)  70 - 99 mg/dL Final   Glucose reference range applies only to samples taken after fasting for at least 8 hours.   Glucose-Capillary 02/03/2022 233 (H)  70 - 99 mg/dL Final   Glucose reference range applies only to samples taken after fasting for at least 8 hours.   Glucose-Capillary 02/04/2022 126 (H)  70 - 99 mg/dL Final   Glucose reference range applies only to samples taken after fasting for at least 8 hours.   Glucose-Capillary 02/04/2022 117 (H)  70 - 99 mg/dL Final   Glucose reference range applies only to samples taken after fasting for at least 8 hours.   Glucose-Capillary 02/04/2022 135 (H)  70 - 99  mg/dL Final   Glucose reference range applies only to samples taken after fasting for at least 8 hours.   Glucose-Capillary 02/04/2022 197 (H)  70 - 99 mg/dL Final   Glucose reference range applies only to samples taken after fasting for at least 8 hours.   Glucose-Capillary 02/05/2022 170 (H)  70 - 99 mg/dL Final   Glucose reference range applies only to samples taken after fasting for at least 8 hours.   Glucose-Capillary 02/05/2022 107 (H)  70 - 99 mg/dL Final   Glucose reference range applies only to samples taken after fasting for at least 8 hours.   Glucose-Capillary 02/05/2022 184 (H)  70 - 99 mg/dL Final   Glucose reference range applies only to samples taken after fasting for at least 8 hours.   Glucose-Capillary 02/05/2022 256 (H)  70 - 99 mg/dL Final   Glucose reference range applies only to samples taken after fasting for at least 8 hours.   Glucose-Capillary 02/06/2022 144 (H)  70 - 99 mg/dL Final   Glucose reference range applies only to samples taken after fasting for at least 8 hours.   Glucose-Capillary 02/06/2022 190 (H)  70 - 99 mg/dL Final   Glucose reference range applies only to samples taken after fasting for at least 8 hours.   Glucose-Capillary 02/06/2022 92  70 - 99 mg/dL Final   Glucose reference range applies only to samples taken after fasting for at least 8 hours.   Trichomonas 02/06/2022 Negative   Final   Bacterial Vaginitis-Urine 02/06/2022 Negative   Final   Molecular Comment 02/06/2022 For tests bacteria and/or candida, this specimen does not meet the   Final   Molecular Comment 02/06/2022 strict criteria set by the FDA. The result interpretation should be   Final   Molecular Comment 02/06/2022 considered in conjunction with the patient's clinical history.   Final   Comment 02/06/2022 Normal Reference Range Trichomonas - Negative   Final   Glucose-Capillary 02/06/2022 197 (H)  70 - 99 mg/dL Final   Glucose reference range applies only to samples taken after  fasting for at least 8 hours.  Glucose-Capillary 02/07/2022 129 (H)  70 - 99 mg/dL Final   Glucose reference range applies only to samples taken after fasting for at least 8 hours.   Glucose-Capillary 02/07/2022 207 (H)  70 - 99 mg/dL Final   Glucose reference range applies only to samples taken after fasting for at least 8 hours.   Glucose-Capillary 02/07/2022 153 (H)  70 - 99 mg/dL Final   Glucose reference range applies only to samples taken after fasting for at least 8 hours.   Glucose-Capillary 02/08/2022 129 (H)  70 - 99 mg/dL Final   Glucose reference range applies only to samples taken after fasting for at least 8 hours.   Glucose-Capillary 02/08/2022 107 (H)  70 - 99 mg/dL Final   Glucose reference range applies only to samples taken after fasting for at least 8 hours.  Admission on 01/23/2022, Discharged on 01/24/2022  Component Date Value Ref Range Status   Sodium 01/23/2022 140  135 - 145 mmol/L Final   Potassium 01/23/2022 4.4  3.5 - 5.1 mmol/L Final   Chloride 01/23/2022 101  98 - 111 mmol/L Final   CO2 01/23/2022 25  22 - 32 mmol/L Final   Glucose, Bld 01/23/2022 198 (H)  70 - 99 mg/dL Final   Glucose reference range applies only to samples taken after fasting for at least 8 hours.   BUN 01/23/2022 13  6 - 20 mg/dL Final   Creatinine, Ser 01/23/2022 0.62  0.44 - 1.00 mg/dL Final   Calcium 01/23/2022 9.3  8.9 - 10.3 mg/dL Final   GFR, Estimated 01/23/2022 >60  >60 mL/min Final   Comment: (NOTE) Calculated using the CKD-EPI Creatinine Equation (2021)    Anion gap 01/23/2022 14  5 - 15 Final   Performed at [facility], [place] [address], [place], [place] [address]   WBC 01/23/2022 5.8  4.0 - 10.5 K/uL Final   RBC 01/23/2022 4.63  3.87 - 5.11 MIL/uL Final   Hemoglobin 01/23/2022 11.7 (L)  12.0 - 15.0 g/dL Final   HCT 01/23/2022 37.4  36.0 - 46.0 % Final   MCV 01/23/2022 80.8  80.0 - 100.0 fL Final   MCH 01/23/2022 25.3 (L)  26.0 - 34.0 pg Final   MCHC 01/23/2022 31.3   30.0 - 36.0 g/dL Final   RDW 01/23/2022 17.9 (H)  11.5 - 15.5 % Final   Platelets 01/23/2022 333  150 - 400 K/uL Final   nRBC 01/23/2022 0.0  0.0 - 0.2 % Final   Performed at [facility] [address] [place], [place] [address]   Troponin I (High Sensitivity) 01/23/2022 6  <18 ng/L Final   Comment: (NOTE) Elevated high sensitivity troponin I (hsTnI) values and significant  changes across serial measurements may suggest ACS but many other  chronic and acute conditions are known to elevate hsTnI results.  Refer to the "Links" section for chest pain algorithms and additional  guidance. Performed at [facility], [place] [address], [place], [place] [address]    Troponin I (High Sensitivity) 01/23/2022 5  <18 ng/L Final   Comment: (NOTE) Elevated high sensitivity troponin I (hsTnI) values and significant  changes across serial measurements may suggest ACS but many other  chronic and acute conditions are known to elevate hsTnI results.  Refer to the "Links" section for chest pain algorithms and additional  guidance. Performed at [facility], [place] [address], [place], [place] [address]    SARS Coronavirus 2 by RT PCR  01/23/2022 NEGATIVE  NEGATIVE Final   Comment: (NOTE) SARS-CoV-2 target nucleic acids are NOT DETECTED.  The SARS-CoV-2 RNA is generally detectable in upper and lower respiratory specimens during the acute phase of infection. The lowest concentration of SARS-CoV-2 viral copies this assay can detect is 250 copies / mL. A negative result does not preclude SARS-CoV-2 infection and should not be used as the sole basis for treatment or other patient management decisions.  A negative result may occur with improper specimen collection / handling, submission of specimen other than nasopharyngeal swab, presence of viral mutation(s) within the areas targeted by this assay, and inadequate number of viral copies (<250 copies / mL). A negative result must be  combined with clinical observations, patient history, and epidemiological information.  Fact Sheet for Patients:   https://www.patel.info/  Fact Sheet for Healthcare Providers: https://hall.com/  This test is not yet approved or                           cleared by the [place] FDA and has been authorized for detection and/or diagnosis of SARS-CoV-2 by FDA under an Emergency Use Authorization (EUA).  This EUA will remain in effect (meaning this test can be used) for the duration of the COVID-19 declaration under Section 564(b)(1) of the Act, 21 U.S.C. section 360bbb-3(b)(1), unless the authorization is terminated or revoked sooner.  Performed at [facility], [place] [address] [place], [place] [address]    B Natriuretic Peptide 01/23/2022 41.3  0.0 - 100.0 pg/mL Final   Performed at [facility] [address], [place], [place] [address]   Group A Strep by PCR 01/23/2022 NOT DETECTED  NOT DETECTED Final   Performed at [facility], [place] [address] [place], [place] [address]   Color, Urine 01/23/2022 YELLOW  YELLOW Final   APPearance 01/23/2022 CLEAR  CLEAR Final   Specific Gravity, Urine 01/23/2022 1.018  1.005 - 1.030 Final   pH 01/23/2022 7.0  5.0 - 8.0 Final   Glucose, UA 01/23/2022 NEGATIVE  NEGATIVE mg/dL Final   Hgb urine dipstick 01/23/2022 NEGATIVE  NEGATIVE Final   Bilirubin Urine 01/23/2022 NEGATIVE  NEGATIVE Final   Ketones, ur 01/23/2022 NEGATIVE  NEGATIVE mg/dL Final   Protein, ur 01/23/2022 NEGATIVE  NEGATIVE mg/dL Final   Nitrite 01/23/2022 NEGATIVE  NEGATIVE Final   Leukocytes,Ua 01/23/2022 TRACE (A)  NEGATIVE Final   RBC / HPF 01/23/2022 0-5  0 - 5 RBC/hpf Final   WBC, UA 01/23/2022 0-5  0 - 5 WBC/hpf Final   Bacteria, UA 01/23/2022 NONE SEEN  NONE SEEN Final   Squamous Epithelial / LPF 01/23/2022 0-5  0 - 5 Final   Mucus 01/23/2022 PRESENT   Final   Performed at [facility], [place]  [address], [place], [place] [address]   SARS Coronavirus 2 by RT PCR 01/23/2022 NEGATIVE  NEGATIVE Final   Comment: (NOTE) SARS-CoV-2 target nucleic acids are NOT DETECTED.  The SARS-CoV-2 RNA is generally detectable in upper respiratory specimens during the acute phase of infection. The lowest concentration of SARS-CoV-2 viral copies this assay can detect is 138 copies/mL. A negative result does not preclude SARS-Cov-2 infection and should not be used as the sole basis for treatment or other patient management decisions. A negative result may occur with  improper specimen collection/handling, submission of specimen other than nasopharyngeal swab, presence of viral mutation(s) within the areas targeted by this assay, and inadequate  number of viral copies(<138 copies/mL). A negative result must be combined with clinical observations, patient history, and epidemiological information. The expected result is Negative.  Fact Sheet for Patients:  EntrepreneurPulse.com.au  Fact Sheet for Healthcare Providers:  IncredibleEmployment.be  This test is no                          t yet approved or cleared by the [place] FDA and  has been authorized for detection and/or diagnosis of SARS-CoV-2 by FDA under an Emergency Use Authorization (EUA). This EUA will remain  in effect (meaning this test can be used) for the duration of the COVID-19 declaration under Section 564(b)(1) of the Act, 21 U.S.C.section 360bbb-3(b)(1), unless the authorization is terminated  or revoked sooner.       Influenza A by PCR 01/23/2022 NEGATIVE  NEGATIVE Final   Influenza B by PCR 01/23/2022 NEGATIVE  NEGATIVE Final   Comment: (NOTE) The Xpert Xpress SARS-CoV-2/FLU/RSV plus assay is intended as an aid in the diagnosis of influenza from Nasopharyngeal swab specimens and should not be used as a sole basis for treatment. Nasal washings and aspirates are unacceptable for Xpert Xpress  SARS-CoV-2/FLU/RSV testing.  Fact Sheet for Patients: EntrepreneurPulse.com.au  Fact Sheet for Healthcare Providers: IncredibleEmployment.be  This test is not yet approved or cleared by the [place] FDA and has been authorized for detection and/or diagnosis of SARS-CoV-2 by FDA under an Emergency Use Authorization (EUA). This EUA will remain in effect (meaning this test can be used) for the duration of the COVID-19 declaration under Section 564(b)(1) of the Act, 21 U.S.C. section 360bbb-3(b)(1), unless the authorization is terminated or revoked.  Performed at [facility], [place] [address] [place], [place] [address]    WBC 01/24/2022 6.1  4.0 - 10.5 K/uL Final   RBC 01/24/2022 4.60  3.87 - 5.11 MIL/uL Final   Hemoglobin 01/24/2022 11.7 (L)  12.0 - 15.0 g/dL Final   HCT 01/24/2022 37.0  36.0 - 46.0 % Final   MCV 01/24/2022 80.4  80.0 - 100.0 fL Final   MCH 01/24/2022 25.4 (L)  26.0 - 34.0 pg Final   MCHC 01/24/2022 31.6  30.0 - 36.0 g/dL Final   RDW 01/24/2022 17.6 (H)  11.5 - 15.5 % Final   Platelets 01/24/2022 334  150 - 400 K/uL Final   nRBC 01/24/2022 0.0  0.0 - 0.2 % Final   Neutrophils Relative % 01/24/2022 53  % Final   Neutro Abs 01/24/2022 3.3  1.7 - 7.7 K/uL Final   Lymphocytes Relative 01/24/2022 33  % Final   Lymphs Abs 01/24/2022 2.0  0.7 - 4.0 K/uL Final   Monocytes Relative 01/24/2022 11  % Final   Monocytes Absolute 01/24/2022 0.7  0.1 - 1.0 K/uL Final   Eosinophils Relative 01/24/2022 2  % Final   Eosinophils Absolute 01/24/2022 0.1  0.0 - 0.5 K/uL Final   Basophils Relative 01/24/2022 1  % Final   Basophils Absolute 01/24/2022 0.1  0.0 - 0.1 K/uL Final   Immature Granulocytes 01/24/2022 0  % Final   Abs Immature Granulocytes 01/24/2022 0.02  0.00 - 0.07 K/uL Final   Performed at [facility], [place] [address], [place], [place] [address]   Glucose-Capillary 01/24/2022 110 (H)  70 - 99 mg/dL Final   Glucose  reference range applies only to samples taken after fasting for at least 8 hours.  No results displayed because visit has over 200  results.    Admission on 11/22/2021, Discharged on 11/22/2021  Component Date Value Ref Range Status   Glucose-Capillary 11/22/2021 169 (H)  70 - 99 mg/dL Final   Glucose reference range applies only to samples taken after fasting for at least 8 hours.  No results displayed because visit has over 200 results.    Admission on 10/04/2021, Discharged on 10/22/2021  Component Date Value Ref Range Status   SARS Coronavirus 2 by RT PCR 10/04/2021 NEGATIVE  NEGATIVE Final   Comment: (NOTE) SARS-CoV-2 target nucleic acids are NOT DETECTED.  The SARS-CoV-2 RNA is generally detectable in upper respiratory specimens during the acute phase of infection. The lowest concentration of SARS-CoV-2 viral copies this assay can detect is 138 copies/mL. A negative result does not preclude SARS-Cov-2 infection and should not be used as the sole basis for treatment or other patient management decisions. A negative result may occur with  improper specimen collection/handling, submission of specimen other than nasopharyngeal swab, presence of viral mutation(s) within the areas targeted by this assay, and inadequate number of viral copies(<138 copies/mL). A negative result must be combined with clinical observations, patient history, and epidemiological information. The expected result is Negative.  Fact Sheet for Patients:  EntrepreneurPulse.com.au  Fact Sheet for Healthcare Providers:  IncredibleEmployment.be  This test is no                          t yet approved or cleared by the [place] FDA and  has been authorized for detection and/or diagnosis of SARS-CoV-2 by FDA under an Emergency Use Authorization (EUA). This EUA will remain  in effect (meaning this test can be used) for the duration of the COVID-19 declaration under Section  564(b)(1) of the Act, 21 U.S.C.section 360bbb-3(b)(1), unless the authorization is terminated  or revoked sooner.       Influenza A by PCR 10/04/2021 NEGATIVE  NEGATIVE Final   Influenza B by PCR 10/04/2021 NEGATIVE  NEGATIVE Final   Comment: (NOTE) The Xpert Xpress SARS-CoV-2/FLU/RSV plus assay is intended as an aid in the diagnosis of influenza from Nasopharyngeal swab specimens and should not be used as a sole basis for treatment. Nasal washings and aspirates are unacceptable for Xpert Xpress SARS-CoV-2/FLU/RSV testing.  Fact Sheet for Patients: EntrepreneurPulse.com.au  Fact Sheet for Healthcare Providers: IncredibleEmployment.be  This test is not yet approved or cleared by the [place] FDA and has been authorized for detection and/or diagnosis of SARS-CoV-2 by FDA under an Emergency Use Authorization (EUA). This EUA will remain in effect (meaning this test can be used) for the duration of the COVID-19 declaration under Section 564(b)(1) of the Act, 21 U.S.C. section 360bbb-3(b)(1), unless the authorization is terminated or revoked.  Performed at [facility], [place] [address], [place], [place] [address]    WBC 10/04/2021 8.4  4.0 - 10.5 K/uL Final   RBC 10/04/2021 4.42  3.87 - 5.11 MIL/uL Final   Hemoglobin 10/04/2021 11.6 (L)  12.0 - 15.0 g/dL Final   HCT 10/04/2021 35.7 (L)  36.0 - 46.0 % Final   MCV 10/04/2021 80.8  80.0 - 100.0 fL Final   MCH 10/04/2021 26.2  26.0 - 34.0 pg Final   MCHC 10/04/2021 32.5  30.0 - 36.0 g/dL Final   RDW 10/04/2021 16.0 (H)  11.5 - 15.5 % Final   Platelets 10/04/2021 372  150 - 400 K/uL Final   nRBC 10/04/2021 0.0  0.0 - 0.2 % Final  Neutrophils Relative % 10/04/2021 68  % Final   Neutro Abs 10/04/2021 5.7  1.7 - 7.7 K/uL Final   Lymphocytes Relative 10/04/2021 22  % Final   Lymphs Abs 10/04/2021 1.8  0.7 - 4.0 K/uL Final   Monocytes Relative 10/04/2021 8  % Final   Monocytes Absolute  10/04/2021 0.7  0.1 - 1.0 K/uL Final   Eosinophils Relative 10/04/2021 1  % Final   Eosinophils Absolute 10/04/2021 0.1  0.0 - 0.5 K/uL Final   Basophils Relative 10/04/2021 1  % Final   Basophils Absolute 10/04/2021 0.1  0.0 - 0.1 K/uL Final   Immature Granulocytes 10/04/2021 0  % Final   Abs Immature Granulocytes 10/04/2021 0.03  0.00 - 0.07 K/uL Final   Performed at [facility], [place] [address], [place], [place] [address]   Sodium 10/04/2021 136  135 - 145 mmol/L Final   Potassium 10/04/2021 4.2  3.5 - 5.1 mmol/L Final   Chloride 10/04/2021 104  98 - 111 mmol/L Final   CO2 10/04/2021 25  22 - 32 mmol/L Final   Glucose, Bld 10/04/2021 100 (H)  70 - 99 mg/dL Final   Glucose reference range applies only to samples taken after fasting for at least 8 hours.   BUN 10/04/2021 9  6 - 20 mg/dL Final   Creatinine, Ser 10/04/2021 0.52  0.44 - 1.00 mg/dL Final   Calcium 10/04/2021 9.0  8.9 - 10.3 mg/dL Final   Total Protein 10/04/2021 7.0  6.5 - 8.1 g/dL Final   Albumin 10/04/2021 3.2 (L)  3.5 - 5.0 g/dL Final   AST 10/04/2021 13 (L)  15 - 41 U/L Final   ALT 10/04/2021 10  0 - 44 U/L Final   Alkaline Phosphatase 10/04/2021 61  38 - 126 U/L Final   Total Bilirubin 10/04/2021 0.3  0.3 - 1.2 mg/dL Final   GFR, Estimated 10/04/2021 >60  >60 mL/min Final   Comment: (NOTE) Calculated using the CKD-EPI Creatinine Equation (2021)    Anion gap 10/04/2021 7  5 - 15 Final   Performed at [facility] [address], [place], [place] [address]   Hgb A1c MFr Bld 10/04/2021 7.0 (H)  4.8 - 5.6 % Final   Comment: (NOTE) Pre diabetes:          5.7%-6.4%  Diabetes:              >6.4%  Glycemic control for   <7.0% adults with diabetes    Mean Plasma Glucose 10/04/2021 154.2  mg/dL Final   Performed at [facility], [place] [address] [place], [place] [address]   Cholesterol 10/04/2021 178  0 - 200 mg/dL Final   Triglycerides 10/04/2021 155 (H)  <150 mg/dL Final   HDL 10/04/2021 52  >40  mg/dL Final   Total CHOL/HDL Ratio 10/04/2021 3.4  RATIO Final   VLDL 10/04/2021 31  0 - 40 mg/dL Final   LDL Cholesterol 10/04/2021 95  0 - 99 mg/dL Final   Comment:        Total Cholesterol/HDL:CHD Risk Coronary Heart Disease Risk Table                     Men   Women  1/2 Average Risk   3.4   3.3  Average Risk       5.0   4.4  2 X Average Risk   9.6   7.1  3 X Average Risk  23.4  11.0        Use the calculated Patient Ratio above and the CHD Risk Table to determine the patient's CHD Risk.        ATP III CLASSIFICATION (LDL):  <100     mg/dL   Optimal  100-129  mg/dL   Near or Above                    Optimal  130-159  mg/dL   Borderline  160-189  mg/dL   High  >190     mg/dL   Very High Performed at [facility] [address], [place], [place] [address]    POC Amphetamine UR 10/04/2021 None Detected  NONE DETECTED (Cut Off Level 1000 ng/mL) Final   POC Secobarbital (BAR) 10/04/2021 None Detected  NONE DETECTED (Cut Off Level 300 ng/mL) Final   POC Buprenorphine (BUP) 10/04/2021 None Detected  NONE DETECTED (Cut Off Level 10 ng/mL) Final   POC Oxazepam (BZO) 10/04/2021 None Detected  NONE DETECTED (Cut Off Level 300 ng/mL) Final   POC Cocaine UR 10/04/2021 None Detected  NONE DETECTED (Cut Off Level 300 ng/mL) Final   POC Methamphetamine UR 10/04/2021 None Detected  NONE DETECTED (Cut Off Level 1000 ng/mL) Final   POC Morphine 10/04/2021 None Detected  NONE DETECTED (Cut Off Level 300 ng/mL) Final   POC Methadone UR 10/04/2021 None Detected  NONE DETECTED (Cut Off Level 300 ng/mL) Final   POC Oxycodone UR 10/04/2021 Positive (A)  NONE DETECTED (Cut Off Level 100 ng/mL) Final   POC Marijuana UR 10/04/2021 None Detected  NONE DETECTED (Cut Off Level 50 ng/mL) Final   SARSCOV2ONAVIRUS 2 AG 10/04/2021 NEGATIVE  NEGATIVE Final   Comment: (NOTE) SARS-CoV-2 antigen NOT DETECTED.   Negative results are presumptive.  Negative results do not preclude SARS-CoV-2 infection and  should not be used as the sole basis for treatment or other patient management decisions, including infection  control decisions, particularly in the presence of clinical signs and  symptoms consistent with COVID-19, or in those who have been in contact with the virus.  Negative results must be combined with clinical observations, patient history, and epidemiological information. The expected result is Negative.  Fact Sheet for Patients: HandmadeRecipes.com.cy  Fact Sheet for Healthcare Providers: FuneralLife.at  This test is not yet approved or cleared by the [place] FDA and  has been authorized for detection and/or diagnosis of SARS-CoV-2 by FDA under an Emergency Use Authorization (EUA).  This EUA will remain in effect (meaning this test can be used) for the duration of  the COV                          ID-19 declaration under Section 564(b)(1) of the Act, 21 U.S.C. section 360bbb-3(b)(1), unless the authorization is terminated or revoked sooner.     Valproic Acid Lvl 10/04/2021 51  50.0 - 100.0 ug/mL Final   Performed at [facility] [address] [place], [place] [address]   Valproic Acid Lvl 10/08/2021 57  50.0 - 100.0 ug/mL Final   Performed at [facility] [address] [place], [place] [address]   Glucose-Capillary 10/09/2021 123 (H)  70 - 99 mg/dL Final   Glucose reference range applies only to samples taken after fasting for at least 8 hours.  Admission on 09/09/2021, Discharged on 09/10/2021  Component Date Value Ref Range Status   Sodium 09/09/2021 134 (L)  135 -  145 mmol/L Final   Potassium 09/09/2021 4.3  3.5 - 5.1 mmol/L Final   Chloride 09/09/2021 99  98 - 111 mmol/L Final   CO2 09/09/2021 25  22 - 32 mmol/L Final   Glucose, Bld 09/09/2021 107 (H)  70 - 99 mg/dL Final   Glucose reference range applies only to samples taken after fasting for at least 8 hours.   BUN 09/09/2021 12  6 - 20 mg/dL  Final   Creatinine, Ser 09/09/2021 0.74  0.44 - 1.00 mg/dL Final   Calcium 09/09/2021 9.0  8.9 - 10.3 mg/dL Final   Total Protein 09/09/2021 7.4  6.5 - 8.1 g/dL Final   Albumin 09/09/2021 3.1 (L)  3.5 - 5.0 g/dL Final   AST 09/09/2021 13 (L)  15 - 41 U/L Final   ALT 09/09/2021 13  0 - 44 U/L Final   Alkaline Phosphatase 09/09/2021 66  38 - 126 U/L Final   Total Bilirubin 09/09/2021 0.2 (L)  0.3 - 1.2 mg/dL Final   GFR, Estimated 09/09/2021 >60  >60 mL/min Final   Comment: (NOTE) Calculated using the CKD-EPI Creatinine Equation (2021)    Anion gap 09/09/2021 10  5 - 15 Final   Performed at [facility], [place] [address], [place], [place] [address]   WBC 09/09/2021 8.5  4.0 - 10.5 K/uL Final   RBC 09/09/2021 4.53  3.87 - 5.11 MIL/uL Final   Hemoglobin 09/09/2021 11.8 (L)  12.0 - 15.0 g/dL Final   HCT 09/09/2021 38.0  36.0 - 46.0 % Final   MCV 09/09/2021 83.9  80.0 - 100.0 fL Final   MCH 09/09/2021 26.0  26.0 - 34.0 pg Final   MCHC 09/09/2021 31.1  30.0 - 36.0 g/dL Final   RDW 09/09/2021 17.8 (H)  11.5 - 15.5 % Final   Platelets 09/09/2021 442 (H)  150 - 400 K/uL Final   nRBC 09/09/2021 0.0  0.0 - 0.2 % Final   Neutrophils Relative % 09/09/2021 70  % Final   Neutro Abs 09/09/2021 5.9  1.7 - 7.7 K/uL Final   Lymphocytes Relative 09/09/2021 20  % Final   Lymphs Abs 09/09/2021 1.7  0.7 - 4.0 K/uL Final   Monocytes Relative 09/09/2021 8  % Final   Monocytes Absolute 09/09/2021 0.7  0.1 - 1.0 K/uL Final   Eosinophils Relative 09/09/2021 1  % Final   Eosinophils Absolute 09/09/2021 0.1  0.0 - 0.5 K/uL Final   Basophils Relative 09/09/2021 1  % Final   Basophils Absolute 09/09/2021 0.1  0.0 - 0.1 K/uL Final   Immature Granulocytes 09/09/2021 0  % Final   Abs Immature Granulocytes 09/09/2021 0.03  0.00 - 0.07 K/uL Final   Performed at [facility], [place] [address], [place], [place] [address]   Ammonia 09/09/2021 37 (H)  9 - 35 umol/L Final   Comment: HEMOLYSIS AT THIS LEVEL MAY  AFFECT RESULT Performed at [facility], [place] [address] [address], [place], [place] [address]    Opiates 09/09/2021 NONE DETECTED  NONE DETECTED Final   Cocaine 09/09/2021 NONE DETECTED  NONE DETECTED Final   Benzodiazepines 09/09/2021 NONE DETECTED  NONE DETECTED Final   Amphetamines 09/09/2021 NONE DETECTED  NONE DETECTED Final   Tetrahydrocannabinol 09/09/2021 NONE DETECTED  NONE DETECTED Final   Barbiturates 09/09/2021 NONE DETECTED  NONE DETECTED Final   Comment: (NOTE) DRUG SCREEN FOR MEDICAL PURPOSES ONLY.  IF CONFIRMATION IS NEEDED FOR ANY PURPOSE, NOTIFY LAB WITHIN 5 DAYS.  LOWEST DETECTABLE  LIMITS FOR URINE DRUG SCREEN Drug Class                     Cutoff (ng/mL) Amphetamine and metabolites    1000 Barbiturate and metabolites    200 Benzodiazepine                 462 Tricyclics and metabolites     300 Opiates and metabolites        300 Cocaine and metabolites        300 THC                            50 Performed at [facility], [place] [address] [place], [place] [address]    Alcohol, Ethyl (B) 09/09/2021 <10  <10 mg/dL Final   Comment: (NOTE) Lowest detectable limit for serum alcohol is 10 mg/dL.  For medical purposes only. Performed at [facility], [place] [address] [place], [place] [address]    Lipase 09/09/2021 33  11 - 51 U/L Final   Performed at [facility] [address], [place], [place] [address]   Color, Urine 09/09/2021 COLORLESS (A)  YELLOW Final   APPearance 09/09/2021 CLEAR  CLEAR Final   Specific Gravity, Urine 09/09/2021 1.002 (L)  1.005 - 1.030 Final   pH 09/09/2021 6.0  5.0 - 8.0 Final   Glucose, UA 09/09/2021 NEGATIVE  NEGATIVE mg/dL Final   Hgb urine dipstick 09/09/2021 NEGATIVE  NEGATIVE Final   Bilirubin Urine 09/09/2021 NEGATIVE  NEGATIVE Final   Ketones, ur 09/09/2021 NEGATIVE  NEGATIVE mg/dL Final   Protein, ur 09/09/2021 NEGATIVE  NEGATIVE mg/dL Final   Nitrite 09/09/2021 NEGATIVE  NEGATIVE Final   Leukocytes,Ua  09/09/2021 NEGATIVE  NEGATIVE Final   Performed at [facility] [address], [place], [place] [address]   Specimen Description 09/09/2021 URINE, CLEAN CATCH   Final   Special Requests 09/09/2021 NONE   Final   Culture 09/09/2021  (A)   Final                   Value:<10,000 COLONIES/mL INSIGNIFICANT GROWTH Performed at [facility], [place] [address] [place], [place] [address]    Report Status 09/09/2021 09/10/2021 FINAL   Final   D-Dimer, Quant 09/09/2021 0.92 (H)  0.00 - 0.50 ug/mL-FEU Final   Comment: (NOTE) At the manufacturer cut-off value of 0.5 g/mL FEU, this assay has a negative predictive value of 95-100%.This assay is intended for use in conjunction with a clinical pretest probability (PTP) assessment model to exclude pulmonary embolism (PE) and deep venous thrombosis (DVT) in outpatients suspected of PE or DVT. Results should be correlated with clinical presentation. Performed at [facility], [place] [address] [place],  [address]    Troponin I (High Sensitivity) 09/09/2021 5  <18 ng/L Final   Comment: (NOTE) Elevated high sensitivity troponin I (hsTnI) values and significant  changes across serial measurements may suggest ACS but many other  chronic and acute conditions are known to elevate hsTnI results.  Refer to the "Links" section for chest pain algorithms and additional  guidance. Performed at [facility], [place] [address], [place], [place] [address]    BP 09/10/2021 135/83  mmHg Final   S' Lateral 09/10/2021 2.30  cm Final   Area-P 1/2 09/10/2021 5.58  cm2 Final    Blood Alcohol level:  Lab Results  Component  Value Date   ETH <10 11/23/2021   ETH <10 25/06/3974    Metabolic Disorder Labs: Lab Results  Component Value Date   HGBA1C 6.7 (H) 12/28/2021   MPG 145.59 12/28/2021   MPG 154.2 10/04/2021   Lab Results  Component Value Date   PROLACTIN 3.8 (L) 11/23/2021   Lab Results  Component Value Date   CHOL 171  11/23/2021   TRIG 125 11/23/2021   HDL 65 11/23/2021   CHOLHDL 2.6 11/23/2021   VLDL 25 11/23/2021   LDLCALC 81 11/23/2021   [place] 95 10/04/2021    Therapeutic Lab Levels: No results found for: "LITHIUM" Lab Results  Component Value Date   VALPROATE 33 (L) 01/18/2022   VALPROATE 33 (L) 12/28/2021   No results found for: "CBMZ"  Physical Findings   PHQ2-9    [facility] from 11/23/2021 in [facility]  PHQ-2 Total Score 2  PHQ-9 Total Score 3      Flowsheet Row ED from 02/09/2022 in [facility] ED from 02/08/2022 in [facility] ED from 01/24/2022 in [facility] No Risk No Risk No Risk        Musculoskeletal  Strength & Muscle Tone: within normal limits Gait & Station: normal Patient leans: N/A  Psychiatric Specialty Exam  Presentation  General Appearance:  Appropriate for Environment  Eye Contact: Good  Speech: Normal Rate  Speech Volume: Normal  Handedness: Right   Mood and Affect  Mood: Euthymic  Affect: Appropriate   Thought Process  Thought Processes: Goal Directed  Descriptions of Associations:Intact  Orientation:Full (Time, Place and Person)  Thought Content:WDL  Diagnosis of Schizophrenia or Schizoaffective disorder in past: Yes  Duration of Psychotic Symptoms: >6 months  Hallucinations:denies  Ideas of Reference:None  Suicidal Thoughts:denies  Homicidal Thoughts:denies   Sensorium  Memory: Immediate Fair  Judgment: Intact  Insight: Present   Executive Functions  Concentration: Fair  Attention Span: Good  Recall: [place] of Knowledge: Fair  Language: Good   Psychomotor Activity  Psychomotor Activity: No data recorded   Assets  Assets: Communication Skills; Desire for Improvement; Financial Resources/Insurance   Sleep  Sleep:  appropriate   Physical Exam  Physical Exam Constitutional:      Appearance: Normal appearance.  HENT:     Head: Normocephalic and atraumatic.  Cardiovascular:     Rate and Rhythm: Normal rate and regular rhythm.  Pulmonary:     Effort: Pulmonary effort is normal.  Musculoskeletal:        General: Normal range of motion.  Skin:    General: Skin is warm and dry.  Neurological:     General: No focal deficit present.     Mental Status: She is alert and oriented to person, place, and time. Mental status is at baseline.    Review of Systems  Respiratory:  Negative for shortness of breath.   Cardiovascular:  Negative for chest pain.  Gastrointestinal:  Negative for abdominal pain, constipation, diarrhea, heartburn, nausea and vomiting.  Neurological:  Negative for headaches.   Blood pressure (!) 145/89, pulse (!) 106, temperature 98 F (36.7 C), temperature source Oral, resp. rate 20, SpO2 99 %. There is no height or weight on file to calculate BMI.  Treatment Plan Summary: Daily contact with patient to assess and evaluate symptoms and progress in treatment and Medication management  Medications were restarted on admission with the inclusion of colchicine  for pericardial effusion. Appreciate IM recs upon their assessment yesterday.  Adjusted medications to match what she was taking at [facility]: -Eliquis 5 mg p.o. twice daily A-fib -Abilify 10 mg p.o. daily mood -Vitamin D3 50,000 units p.o. every Thursday Vit D deficiency -Clozaril 75 mg p.o. nightly Schizophrenia -Clozaril 50 mg p.o. 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 daily A-fib -Depakote ER 500 mg p.o. twice daily mood -Haldol 10 mg p.o. 3 times daily as needed agitation, psychotic symptoms -Diabetes mellitus care plan insulin protocol before meals and at bedtime -Ingrezza 40 mg p.o. daily, for Tardive dyskinesia -Metformin 500 mg p.o.  twice daily with meals type 2 diabetes -Ferrous sulphate 325 mg Po daily with breakfast for iron deficiency -Start OCEAN nasal spray   Other PRNs -Tylenol 650 mg p.o. every 6 hours as needed pain -Maalox 30 mm p.o. every 4 hours as needed indigestion -Atarax 25 mg p.o. 3 times daily as needed anxiety -MOM 30 mL p.o. daily as needed constipation -Cepacol lozenge 3 mg p.o. as needed sore throat -Trazodone 50 mg p.o. nightly as needed sleep  France Ravens, MD 02/11/2022 1:07 PM

## 2022-02-11 NOTE — Progress Notes (Signed)
LCSW Progress Note   1230 - Disposition Meeting  In attendance:  Hansel Starling, MSW, LCSW [facility] Care Coordinator Denton Lank - [facility] Care Coordinator Dionshay [place] - [facility] Supervisor    Provider Authorization for Single Case Agreement has been submitted by Harless Nakayama at the [facility] group [facility], and it has been approved.  Ms. Algis Greenhouse is scheduled to do an interview with the pt on 12 February 2022 @ 1230 to gather information for the [facility] Plan that is to be submitted with the [employer] (SAR).  Expected timeframe for the SAR to be submitted will be next week at most.  Ms. Algis Greenhouse will be responsible for submitting this.  Next meeting is 17 February 2022 @ 1230.   Hansel Starling, MSW, LCSW [facility] [phone] phone [phone] fx

## 2022-02-11 NOTE — ED Notes (Signed)
Pt is sleeping. No distress noted. Will continue to monitor safety. 

## 2022-02-12 DIAGNOSIS — F209 Schizophrenia, unspecified: Secondary | ICD-10-CM | POA: Diagnosis not present

## 2022-02-12 LAB — URINALYSIS, ROUTINE W REFLEX MICROSCOPIC
Bilirubin Urine: NEGATIVE
Glucose, UA: NEGATIVE mg/dL
Hgb urine dipstick: NEGATIVE
Ketones, ur: NEGATIVE mg/dL
Leukocytes,Ua: NEGATIVE
Nitrite: NEGATIVE
Protein, ur: NEGATIVE mg/dL
Specific Gravity, Urine: 1.015 (ref 1.005–1.030)
pH: 5 (ref 5.0–8.0)

## 2022-02-12 LAB — GLUCOSE, CAPILLARY
Glucose-Capillary: 128 mg/dL — ABNORMAL HIGH (ref 70–99)
Glucose-Capillary: 149 mg/dL — ABNORMAL HIGH (ref 70–99)
Glucose-Capillary: 128 mg/dL — ABNORMAL HIGH (ref 70–99)
Glucose-Capillary: 196 mg/dL — ABNORMAL HIGH (ref 70–99)

## 2022-02-12 MED ORDER — HALOPERIDOL 5 MG PO TABS: 5.0000 mg | ORAL_TABLET | Freq: Three times a day (TID) | ORAL | Status: DC | PRN

## 2022-02-12 MED ORDER — CLOZAPINE 25 MG PO TABS
75.0000 mg | ORAL_TABLET | Freq: Every day | ORAL | Status: DC
  Administered 2022-02-12 – 2022-02-17 (×6): 75 mg via ORAL
  Filled 2022-02-12 (×6): qty 3

## 2022-02-12 NOTE — ED Notes (Signed)
Patient calm and cooperative on unit. Patient denies SI,HI, and A/V/H with no plan/intent. Patient is med compliant and denies any pain or discomfort. No s/s of current distress.

## 2022-02-12 NOTE — ED Notes (Signed)
Patient has not been able to provide a UA thus far. Patient provided with cup and will let staff know when she is able to provide a UA.

## 2022-02-12 NOTE — ED Notes (Signed)
Pt is sleeping. No distress noted. Will continue to monitor safety. 

## 2022-02-12 NOTE — Progress Notes (Signed)
LCSW Progress Note   1230 - Pt met with Ladoris Gene, [facility], to complete th PCP interview.  Ms. Guerry Bruin stated that she should have the PCP completely written and ready to sign later in the afternoon.   Omelia Blackwater, MSW, [facility] [place] phone [phone] fax

## 2022-02-12 NOTE — ED Notes (Signed)
Pt sleeping at present, no distress noted.  Monitoring for safety. 

## 2022-02-12 NOTE — ED Notes (Signed)
Pt is in the bed watching TV. Respirations are even and unlabored. No acute distress noted. Will continue to monitor for safety.  

## 2022-02-12 NOTE — ED Provider Notes (Signed)
Behavioral Health Progress Note  Date and Time: 02/12/2022 8:50 AM Name: Teresa Murillo MRN:  [id number]  Reason for Admission: Teresa Murillo is a 61 year old female with a past psychiatric history significant for schizophrenia, aggressive behavior, and possible intellectual disability who presented to [facility] on 11/23/2021 as a walk-in. At the time, patient had been dismissed from the group home due to multiple elopements. Patient has remained at [facility] while awaiting new placement. Social work and DSS are involved.   Subjective:  Seen and assessed at bedside. Denies SI/HI/AVH. Denies chest pain. Somewhat scattered and disorganized today with pressured speech. Reports some loneliness but tolerable. States she is eating well and sleeping well. Denies any other major complaints at this time. Denies cough, fever, chills, headache, n/v. Patient has no other questions at this time. LCSW continuing to assist with patient's dispo.  Diagnosis:  Final diagnoses:  At risk for self care deficit  Schizophrenia, unspecified type ([place])  Noncompliance with medication regimen  Borderline intellectual functioning    Total Time spent with patient: 45 minutes  Past Psychiatric History: see H&P Past Medical History:  Past Medical History:  Diagnosis Date   Borderline intellectual functioning 12/14/2021   On 12/14/2021: Appreciate assistance from psychology consult. On the Wechsler Adult Intelligence Scale-4, Ms. Luba achieved a full-scale IQ score of 73 and a percentile rank of 4 placing her in the borderline range of intellectual functioning.    Chronic obstructive pulmonary disease (COPD) (HCC)    Glaucoma    Hyperlipidemia    Hypertension    Iron deficiency    Schizoaffective disorder (HCC)    Type 2 diabetes mellitus (HCC)     Past Surgical History:  Procedure Laterality Date   TUBAL LIGATION     Family History:  Family History  Problem Relation Age of Onset   Breast cancer Maternal  Grandmother    Family Psychiatric  History: see H&P Social History:  Social History   Substance and Sexual Activity  Alcohol Use Not Currently     Social History   Substance and Sexual Activity  Drug Use Not Currently    Social History   Socioeconomic History   Marital status: Divorced    Spouse name: Not on file   Number of children: Not on file   Years of education: Not on file   Highest education level: Not on file  Occupational History   Not on file  Tobacco Use   Smoking status: Former    Packs/day: 1.00    Types: Cigars, Cigarettes    Quit date: 11/09/2021    Years since quitting: 0.2   Smokeless tobacco: Current  Vaping Use   Vaping Use: Never used  Substance and Sexual Activity   Alcohol use: Not Currently   Drug use: Not Currently   Sexual activity: Not Currently    Birth control/protection: Surgical  Other Topics Concern   Not on file  Social History Narrative   Not on file   Social Determinants of Health   Financial Resource Strain: Not on file  Food Insecurity: Not on file  Transportation Needs: Not on file  Physical Activity: Not on file  Stress: Not on file  Social Connections: Not on file   SDOH:  SDOH Screenings   Depression (PHQ2-9): Low Risk  (11/23/2021)  Tobacco Use: High Risk (02/08/2022)   Additional Social History:                         Sleep:  Fair  Appetite:  Fair  Current Medications:  Current Facility-Administered Medications  Medication Dose Route Frequency Provider Last Rate Last Admin   acetaminophen (TYLENOL) tablet 650 mg  650 mg Oral Q6H PRN Onuoha, Chinwendu V, NP   650 mg at 02/11/22 2120   alum & mag hydroxide-simeth (MAALOX/MYLANTA) 200-200-20 MG/5ML suspension 30 mL  30 mL Oral Q4H PRN Onuoha, Chinwendu V, NP   30 mL at 02/10/22 0133   apixaban (ELIQUIS) tablet 5 mg  5 mg Oral BID Onuoha, Chinwendu V, NP   5 mg at 02/11/22 2120   ARIPiprazole (ABILIFY) tablet 10 mg  10 mg Oral Daily Onuoha, Chinwendu  V, NP   10 mg at 02/11/22 0902   cloZAPine (CLOZARIL) tablet 75 mg  75 mg Oral Aliene Altes, MD   75 mg at 02/11/22 2120   cloZAPine (CLOZARIL) tablet 75 mg  75 mg Oral Daily France Ravens, MD       colchicine tablet 0.6 mg  0.6 mg Oral QHS Onuoha, Chinwendu V, NP   0.6 mg at 02/11/22 2120   diltiazem (CARDIZEM CD) 24 hr capsule 240 mg  240 mg Oral Daily Onuoha, Chinwendu V, NP   240 mg at 02/11/22 0902   divalproex (DEPAKOTE ER) 24 hr tablet 500 mg  500 mg Oral BID Onuoha, Chinwendu V, NP   500 mg at 02/11/22 2120   ferrous sulfate tablet 325 mg  325 mg Oral Q breakfast Onuoha, Chinwendu V, NP   325 mg at 02/12/22 0842   haloperidol (HALDOL) tablet 5 mg  5 mg Oral TID PRN France Ravens, MD       hydrOXYzine (ATARAX) tablet 25 mg  25 mg Oral TID PRN Onuoha, Chinwendu V, NP   25 mg at 02/11/22 2120   insulin aspart (novoLOG) injection 0-5 Units  0-5 Units Subcutaneous QHS Onuoha, Chinwendu V, NP       insulin aspart (novoLOG) injection 0-9 Units  0-9 Units Subcutaneous TID WC Onuoha, Chinwendu V, NP   1 Units at 02/12/22 0843   magnesium hydroxide (MILK OF MAGNESIA) suspension 30 mL  30 mL Oral Daily PRN Onuoha, Chinwendu V, NP       menthol-cetylpyridinium (CEPACOL) lozenge 3 mg  1 lozenge Oral PRN Onuoha, Chinwendu V, NP   3 mg at 02/11/22 1951   metFORMIN (GLUCOPHAGE) tablet 500 mg  500 mg Oral BID WC Onuoha, Chinwendu V, NP   500 mg at 02/12/22 0842   sodium chloride (OCEAN) 0.65 % nasal spray 1 spray  1 spray Each Nare PRN France Ravens, MD       traZODone (DESYREL) tablet 50 mg  50 mg Oral QHS PRN Onuoha, Chinwendu V, NP   50 mg at 02/11/22 2120   valbenazine (INGREZZA) capsule 40 mg  40 mg Oral q AM Onuoha, Chinwendu V, NP   40 mg at 02/12/22 1740   Vitamin D (Ergocalciferol) (DRISDOL) 1.25 MG (50000 UNIT) capsule 50,000 Units  50,000 Units Oral Q7 days Onuoha, Chinwendu V, NP   50,000 Units at 02/11/22 0902   Current Outpatient Medications  Medication Sig Dispense Refill   Accu-Chek Softclix  Lancets lancets Use as directed up to four times daily 100 each 0   apixaban (ELIQUIS) 5 MG TABS tablet Take 1 tablet (5 mg total) by mouth 2 (two) times daily. 60 tablet 0   ARIPiprazole (ABILIFY) 10 MG tablet Take 1 tablet (10 mg total) by mouth daily. 30 tablet 0   Blood Glucose Monitoring Suppl (  ACCU-CHEK GUIDE) w/Device KIT Use as directed up to four times daily 1 kit 0   budesonide-formoterol (SYMBICORT) 160-4.5 MCG/ACT inhaler Inhale 2 puffs into the lungs in the morning and at bedtime.     Cholecalciferol (VITAMIN D3) 1.25 MG (50000 UT) CAPS Take 50,000 Units by mouth every Thursday.     clozapine (CLOZARIL) 50 MG tablet Take 1 tablet (50 mg total) by mouth daily. 30 tablet 0   colchicine 0.6 MG tablet Take 1 tablet (0.6 mg total) by mouth at bedtime. 30 tablet 0   diltiazem (CARDIZEM CD) 240 MG 24 hr capsule Take 1 capsule (240 mg total) by mouth daily. (Patient not taking: Reported on 11/24/2021) 30 capsule 0   divalproex (DEPAKOTE ER) 500 MG 24 hr tablet Take 1 tablet (500 mg total) by mouth 2 (two) times daily. 60 tablet 0   glucose blood test strip Use as directed up to four times daily 50 each 0   haloperidol (HALDOL) 10 MG tablet Take 1 tablet (10 mg total) by mouth 3 (three) times daily as needed for agitation (and psychotic symptoms).     INGREZZA 40 MG capsule Take 1 capsule (40 mg total) by mouth in the morning. 30 capsule 0   insulin aspart (NOVOLOG) 100 UNIT/ML FlexPen Before each meal 3 times a day, 140-199 - 2 units, 200-250 - 4 units, 251-299 - 6 units,  300-349 - 8 units,  350 or above 10 units. Insulin PEN if approved, provide syringes and needles if needed.Please switch to any approved short acting Insulin if needed. 15 mL 0   insulin glargine (LANTUS) 100 UNIT/ML Solostar Pen Inject 12 Units into the skin daily. 15 mL 0   Insulin Pen Needle 32G X 4 MM MISC Use 4 times a day with insulin, 1 month supply. 100 each 0   latanoprost (XALATAN) 0.005 % ophthalmic solution Place 1  drop into both eyes at bedtime.     metFORMIN (GLUCOPHAGE) 500 MG tablet Take 500 mg by mouth 2 (two) times daily with a meal.     nicotine (NICODERM CQ - DOSED IN MG/24 HR) 7 mg/24hr patch Place 1 patch (7 mg total) onto the skin daily. 28 patch 0   PROAIR HFA 108 (90 Base) MCG/ACT inhaler Inhale 2 puffs into the lungs every 6 (six) hours as needed for wheezing or shortness of breath.      Labs  Lab Results:  Admission on 02/09/2022  Component Date Value Ref Range Status   Glucose-Capillary 02/09/2022 118 (H)  70 - 99 mg/dL Final   Glucose reference range applies only to samples taken after fasting for at least 8 hours.   Glucose-Capillary 02/10/2022 148 (H)  70 - 99 mg/dL Final   Glucose reference range applies only to samples taken after fasting for at least 8 hours.   Glucose-Capillary 02/10/2022 174 (H)  70 - 99 mg/dL Final   Glucose reference range applies only to samples taken after fasting for at least 8 hours.   Glucose-Capillary 02/10/2022 128 (H)  70 - 99 mg/dL Final   Glucose reference range applies only to samples taken after fasting for at least 8 hours.   Glucose-Capillary 02/10/2022 182 (H)  70 - 99 mg/dL Final   Glucose reference range applies only to samples taken after fasting for at least 8 hours.   Glucose-Capillary 02/11/2022 158 (H)  70 - 99 mg/dL Final   Glucose reference range applies only to samples taken after fasting for at least 8 hours.  Glucose-Capillary 02/11/2022 159 (H)  70 - 99 mg/dL Final   Glucose reference range applies only to samples taken after fasting for at least 8 hours.   Glucose-Capillary 02/11/2022 153 (H)  70 - 99 mg/dL Final   Glucose reference range applies only to samples taken after fasting for at least 8 hours.   Glucose-Capillary 02/11/2022 159 (H)  70 - 99 mg/dL Final   Glucose reference range applies only to samples taken after fasting for at least 8 hours.   Glucose-Capillary 02/11/2022 153 (H)  70 - 99 mg/dL Final   Glucose  reference range applies only to samples taken after fasting for at least 8 hours.   Glucose-Capillary 02/11/2022 109 (H)  70 - 99 mg/dL Final   Glucose reference range applies only to samples taken after fasting for at least 8 hours.   Glucose-Capillary 02/11/2022 213 (H)  70 - 99 mg/dL Final   Glucose reference range applies only to samples taken after fasting for at least 8 hours.   Glucose-Capillary 02/11/2022 229 (H)  70 - 99 mg/dL Final   Glucose reference range applies only to samples taken after fasting for at least 8 hours.   Glucose-Capillary 02/12/2022 128 (H)  70 - 99 mg/dL Final   Glucose reference range applies only to samples taken after fasting for at least 8 hours.  Admission on 02/08/2022, Discharged on 02/09/2022  Component Date Value Ref Range Status   B Natriuretic Peptide 02/08/2022 40.2  0.0 - 100.0 pg/mL Final   Performed at [facility], [address], [place], [place] [address]   Troponin I (High Sensitivity) 02/08/2022 4  <18 ng/L Final   Comment: (NOTE) Elevated high sensitivity troponin I (hsTnI) values and significant  changes across serial measurements may suggest ACS but many other  chronic and acute conditions are known to elevate hsTnI results.  Refer to the "Links" section for chest pain algorithms and additional  guidance. Performed at [facility], [place] [address], [place], [place] [address]    WBC 02/08/2022 8.5  4.0 - 10.5 K/uL Final   RBC 02/08/2022 4.30  3.87 - 5.11 MIL/uL Final   Hemoglobin 02/08/2022 10.7 (L)  12.0 - 15.0 g/dL Final   HCT 02/08/2022 33.1 (L)  36.0 - 46.0 % Final   MCV 02/08/2022 77.0 (L)  80.0 - 100.0 fL Final   MCH 02/08/2022 24.9 (L)  26.0 - 34.0 pg Final   MCHC 02/08/2022 32.3  30.0 - 36.0 g/dL Final   RDW 02/08/2022 16.5 (H)  11.5 - 15.5 % Final   Platelets 02/08/2022 361  150 - 400 K/uL Final   nRBC 02/08/2022 0.0  0.0 - 0.2 % Final   Performed at [facility] [address], [place], [place] [address]    Sodium 02/08/2022 131 (L)  135 - 145 mmol/L Final   Potassium 02/08/2022 4.4  3.5 - 5.1 mmol/L Final   Chloride 02/08/2022 96 (L)  98 - 111 mmol/L Final   CO2 02/08/2022 25  22 - 32 mmol/L Final   Glucose, Bld 02/08/2022 126 (H)  70 - 99 mg/dL Final   Glucose reference range applies only to samples taken after fasting for at least 8 hours.   BUN 02/08/2022 11  6 - 20 mg/dL Final   Creatinine, Ser 02/08/2022 0.52  0.44 - 1.00 mg/dL Final   Calcium 02/08/2022 9.4  8.9 - 10.3 mg/dL Final   Total Protein 02/08/2022 7.5  6.5 - 8.1 g/dL  Final   Albumin 02/08/2022 3.6  3.5 - 5.0 g/dL Final   AST 02/08/2022 22  15 - 41 U/L Final   ALT 02/08/2022 20  0 - 44 U/L Final   Alkaline Phosphatase 02/08/2022 67  38 - 126 U/L Final   Total Bilirubin 02/08/2022 0.1 (L)  0.3 - 1.2 mg/dL Final   GFR, Estimated 02/08/2022 >60  >60 mL/min Final   Comment: (NOTE) Calculated using the CKD-EPI Creatinine Equation (2021)    Anion gap 02/08/2022 10  5 - 15 Final   Performed at [facility], [place] [address], [place], [place] [address]   Troponin I (High Sensitivity) 02/08/2022 5  <18 ng/L Final   Comment: (NOTE) Elevated high sensitivity troponin I (hsTnI) values and significant  changes across serial measurements may suggest ACS but many other  chronic and acute conditions are known to elevate hsTnI results.  Refer to the "Links" section for chest pain algorithms and additional  guidance. Performed at [facility], [place] [address] [place], [place] [address]    WBC 02/09/2022 7.3  4.0 - 10.5 K/uL Final   RBC 02/09/2022 4.70  3.87 - 5.11 MIL/uL Final   Hemoglobin 02/09/2022 11.4 (L)  12.0 - 15.0 g/dL Final   HCT 02/09/2022 36.6  36.0 - 46.0 % Final   MCV 02/09/2022 77.9 (L)  80.0 - 100.0 fL Final   MCH 02/09/2022 24.3 (L)  26.0 - 34.0 pg Final   MCHC 02/09/2022 31.1  30.0 - 36.0 g/dL Final   RDW 02/09/2022 16.6 (H)  11.5 - 15.5 % Final   Platelets 02/09/2022 403 (H)  150 - 400 K/uL Final    nRBC 02/09/2022 0.0  0.0 - 0.2 % Final   Performed at [facility] [address], [place], [place] [address]   Glucose-Capillary 02/08/2022 117 (H)  70 - 99 mg/dL Final   Glucose reference range applies only to samples taken after fasting for at least 8 hours.   Sodium 02/09/2022 138  135 - 145 mmol/L Final   Potassium 02/09/2022 3.7  3.5 - 5.1 mmol/L Final   Chloride 02/09/2022 103  98 - 111 mmol/L Final   CO2 02/09/2022 24  22 - 32 mmol/L Final   Glucose, Bld 02/09/2022 134 (H)  70 - 99 mg/dL Final   Glucose reference range applies only to samples taken after fasting for at least 8 hours.   BUN 02/09/2022 9  6 - 20 mg/dL Final   Creatinine, Ser 02/09/2022 0.44  0.44 - 1.00 mg/dL Final   Calcium 02/09/2022 8.3 (L)  8.9 - 10.3 mg/dL Final   GFR, Estimated 02/09/2022 >60  >60 mL/min Final   Comment: (NOTE) Calculated using the CKD-EPI Creatinine Equation (2021)    Anion gap 02/09/2022 11  5 - 15 Final   Performed at [facility], [place] [address], [place], [place] [address]   Iron 02/09/2022 20 (L)  28 - 170 ug/dL Final   TIBC 02/09/2022 455 (H)  250 - 450 ug/dL Final   Saturation Ratios 02/09/2022 4 (L)  10.4 - 31.8 % Final   UIBC 02/09/2022 435  ug/dL Final   Performed at [facility], [place] [address] [place], [place] [address]   Ferritin 02/09/2022 2 (L)  11 - 307 ng/mL Final   Performed at [facility] [address], [place], [place] [address]   Weight 02/09/2022 2,846.58  oz Final   Height 02/09/2022 62  in Final   BP 02/09/2022 143/80  mmHg Final   WBC 02/09/2022 8.1  4.0 - 10.5 K/uL Final   RBC 02/09/2022 4.38  3.87 - 5.11 MIL/uL Final   Hemoglobin 02/09/2022 10.9 (L)  12.0 - 15.0 g/dL Final   HCT 02/09/2022 33.3 (L)  36.0 - 46.0 % Final   MCV 02/09/2022 76.0 (L)  80.0 - 100.0 fL Final   MCH 02/09/2022 24.9 (L)  26.0 - 34.0 pg Final   MCHC 02/09/2022 32.7  30.0 - 36.0 g/dL Final   RDW 02/09/2022 16.5 (H)  11.5 - 15.5 % Final   Platelets 02/09/2022  397  150 - 400 K/uL Final   nRBC 02/09/2022 0.0  0.0 - 0.2 % Final   Neutrophils Relative % 02/09/2022 66  % Final   Neutro Abs 02/09/2022 5.4  1.7 - 7.7 K/uL Final   Lymphocytes Relative 02/09/2022 24  % Final   Lymphs Abs 02/09/2022 1.9  0.7 - 4.0 K/uL Final   Monocytes Relative 02/09/2022 8  % Final   Monocytes Absolute 02/09/2022 0.6  0.1 - 1.0 K/uL Final   Eosinophils Relative 02/09/2022 1  % Final   Eosinophils Absolute 02/09/2022 0.1  0.0 - 0.5 K/uL Final   Basophils Relative 02/09/2022 1  % Final   Basophils Absolute 02/09/2022 0.1  0.0 - 0.1 K/uL Final   Immature Granulocytes 02/09/2022 0  % Final   Abs Immature Granulocytes 02/09/2022 0.03  0.00 - 0.07 K/uL Final   Performed at [facility], [place] [address], [place], [place] [address]   Glucose-Capillary 02/09/2022 238 (H)  70 - 99 mg/dL Final   Glucose reference range applies only to samples taken after fasting for at least 8 hours.   Glucose-Capillary 02/09/2022 91  70 - 99 mg/dL Final   Glucose reference range applies only to samples taken after fasting for at least 8 hours.   CRP 02/09/2022 <0.5  <1.0 mg/dL Final   Performed at [facility] [address], [place], [place] [address]   Sed Rate 02/09/2022 15  0 - 22 mm/hr Final   Performed at [facility] [address], [place], [place] [address]   Glucose-Capillary 02/09/2022 137 (H)  70 - 99 mg/dL Final   Glucose reference range applies only to samples taken after fasting for at least 8 hours.  Admission on 01/24/2022, Discharged on 02/08/2022  Component Date Value Ref Range Status   Glucose-Capillary 01/24/2022 143 (H)  70 - 99 mg/dL Final   Glucose reference range applies only to samples taken after fasting for at least 8 hours.   Glucose-Capillary 01/24/2022 120 (H)  70 - 99 mg/dL Final   Glucose reference range applies only to samples taken after fasting for at least 8 hours.   Glucose-Capillary 01/25/2022 122 (H)  70 - 99 mg/dL Final   Glucose  reference range applies only to samples taken after fasting for at least 8 hours.   Glucose-Capillary 01/25/2022 190 (H)  70 - 99 mg/dL Final   Glucose reference range applies only to samples taken after fasting for at least 8 hours.   Glucose-Capillary 01/25/2022 163 (H)  70 - 99 mg/dL Final   Glucose reference range applies only to samples taken after fasting for at least 8 hours.   WBC 01/26/2022 6.5  4.0 - 10.5 K/uL Final   RBC 01/26/2022 4.53  3.87 - 5.11 MIL/uL Final   Hemoglobin 01/26/2022 11.3 (L)  12.0 - 15.0 g/dL Final  HCT 01/26/2022 36.4  36.0 - 46.0 % Final   MCV 01/26/2022 80.4  80.0 - 100.0 fL Final   MCH 01/26/2022 24.9 (L)  26.0 - 34.0 pg Final   MCHC 01/26/2022 31.0  30.0 - 36.0 g/dL Final   RDW 01/26/2022 17.3 (H)  11.5 - 15.5 % Final   Platelets 01/26/2022 327  150 - 400 K/uL Final   nRBC 01/26/2022 0.0  0.0 - 0.2 % Final   Neutrophils Relative % 01/26/2022 60  % Final   Neutro Abs 01/26/2022 3.9  1.7 - 7.7 K/uL Final   Lymphocytes Relative 01/26/2022 31  % Final   Lymphs Abs 01/26/2022 2.0  0.7 - 4.0 K/uL Final   Monocytes Relative 01/26/2022 7  % Final   Monocytes Absolute 01/26/2022 0.5  0.1 - 1.0 K/uL Final   Eosinophils Relative 01/26/2022 1  % Final   Eosinophils Absolute 01/26/2022 0.1  0.0 - 0.5 K/uL Final   Basophils Relative 01/26/2022 1  % Final   Basophils Absolute 01/26/2022 0.1  0.0 - 0.1 K/uL Final   Immature Granulocytes 01/26/2022 0  % Final   Abs Immature Granulocytes 01/26/2022 0.02  0.00 - 0.07 K/uL Final   Performed at [facility], [place] [address], [place], [place] [address]   Glucose-Capillary 01/26/2022 149 (H)  70 - 99 mg/dL Final   Glucose reference range applies only to samples taken after fasting for at least 8 hours.   Glucose-Capillary 01/26/2022 130 (H)  70 - 99 mg/dL Final   Glucose reference range applies only to samples taken after fasting for at least 8 hours.   Glucose-Capillary 01/26/2022 151 (H)  70 - 99 mg/dL Final    Glucose reference range applies only to samples taken after fasting for at least 8 hours.   Glucose-Capillary 01/26/2022 170 (H)  70 - 99 mg/dL Final   Glucose reference range applies only to samples taken after fasting for at least 8 hours.   Glucose-Capillary 01/27/2022 129 (H)  70 - 99 mg/dL Final   Glucose reference range applies only to samples taken after fasting for at least 8 hours.   Glucose-Capillary 01/27/2022 101 (H)  70 - 99 mg/dL Final   Glucose reference range applies only to samples taken after fasting for at least 8 hours.   Glucose-Capillary 01/27/2022 220 (H)  70 - 99 mg/dL Final   Glucose reference range applies only to samples taken after fasting for at least 8 hours.   Glucose-Capillary 01/27/2022 154 (H)  70 - 99 mg/dL Final   Glucose reference range applies only to samples taken after fasting for at least 8 hours.   Glucose-Capillary 01/27/2022 163 (H)  70 - 99 mg/dL Final   Glucose reference range applies only to samples taken after fasting for at least 8 hours.   Glucose-Capillary 01/28/2022 127 (H)  70 - 99 mg/dL Final   Glucose reference range applies only to samples taken after fasting for at least 8 hours.   Glucose-Capillary 01/28/2022 110 (H)  70 - 99 mg/dL Final   Glucose reference range applies only to samples taken after fasting for at least 8 hours.   Glucose-Capillary 01/28/2022 167 (H)  70 - 99 mg/dL Final   Glucose reference range applies only to samples taken after fasting for at least 8 hours.   Glucose-Capillary 01/29/2022 145 (H)  70 - 99 mg/dL Final   Glucose reference range applies only to samples taken after fasting for at least 8 hours.  Glucose-Capillary 01/29/2022 203 (H)  70 - 99 mg/dL Final   Glucose reference range applies only to samples taken after fasting for at least 8 hours.   Glucose-Capillary 01/29/2022 153 (H)  70 - 99 mg/dL Final   Glucose reference range applies only to samples taken after fasting for at least 8 hours.    Glucose-Capillary 01/29/2022 166 (H)  70 - 99 mg/dL Final   Glucose reference range applies only to samples taken after fasting for at least 8 hours.   Glucose-Capillary 01/30/2022 142 (H)  70 - 99 mg/dL Final   Glucose reference range applies only to samples taken after fasting for at least 8 hours.   Glucose-Capillary 01/30/2022 138 (H)  70 - 99 mg/dL Final   Glucose reference range applies only to samples taken after fasting for at least 8 hours.   Glucose-Capillary 01/30/2022 185 (H)  70 - 99 mg/dL Final   Glucose reference range applies only to samples taken after fasting for at least 8 hours.   Glucose-Capillary 01/30/2022 220 (H)  70 - 99 mg/dL Final   Glucose reference range applies only to samples taken after fasting for at least 8 hours.   Glucose-Capillary 01/31/2022 147 (H)  70 - 99 mg/dL Final   Glucose reference range applies only to samples taken after fasting for at least 8 hours.   Glucose-Capillary 01/31/2022 131 (H)  70 - 99 mg/dL Final   Glucose reference range applies only to samples taken after fasting for at least 8 hours.   Glucose-Capillary 01/31/2022 169 (H)  70 - 99 mg/dL Final   Glucose reference range applies only to samples taken after fasting for at least 8 hours.   Glucose-Capillary 01/31/2022 144 (H)  70 - 99 mg/dL Final   Glucose reference range applies only to samples taken after fasting for at least 8 hours.   Comment 1 01/31/2022 RN AWARE   Final   Glucose-Capillary 02/01/2022 117 (H)  70 - 99 mg/dL Final   Glucose reference range applies only to samples taken after fasting for at least 8 hours.   Glucose-Capillary 02/01/2022 180 (H)  70 - 99 mg/dL Final   Glucose reference range applies only to samples taken after fasting for at least 8 hours.   Glucose-Capillary 02/01/2022 209 (H)  70 - 99 mg/dL Final   Glucose reference range applies only to samples taken after fasting for at least 8 hours.   Glucose-Capillary 02/02/2022 131 (H)  70 - 99 mg/dL Final    Glucose reference range applies only to samples taken after fasting for at least 8 hours.   WBC 02/02/2022 7.5  4.0 - 10.5 K/uL Final   RBC 02/02/2022 4.52  3.87 - 5.11 MIL/uL Final   Hemoglobin 02/02/2022 11.3 (L)  12.0 - 15.0 g/dL Final   HCT 02/02/2022 35.0 (L)  36.0 - 46.0 % Final   MCV 02/02/2022 77.4 (L)  80.0 - 100.0 fL Final   MCH 02/02/2022 25.0 (L)  26.0 - 34.0 pg Final   MCHC 02/02/2022 32.3  30.0 - 36.0 g/dL Final   RDW 02/02/2022 16.7 (H)  11.5 - 15.5 % Final   Platelets 02/02/2022 345  150 - 400 K/uL Final   nRBC 02/02/2022 0.0  0.0 - 0.2 % Final   Neutrophils Relative % 02/02/2022 63  % Final   Neutro Abs 02/02/2022 4.7  1.7 - 7.7 K/uL Final   Lymphocytes Relative 02/02/2022 28  % Final   Lymphs Abs 02/02/2022 2.1  0.7 -  4.0 K/uL Final   Monocytes Relative 02/02/2022 7  % Final   Monocytes Absolute 02/02/2022 0.5  0.1 - 1.0 K/uL Final   Eosinophils Relative 02/02/2022 1  % Final   Eosinophils Absolute 02/02/2022 0.1  0.0 - 0.5 K/uL Final   Basophils Relative 02/02/2022 1  % Final   Basophils Absolute 02/02/2022 0.1  0.0 - 0.1 K/uL Final   Immature Granulocytes 02/02/2022 0  % Final   Abs Immature Granulocytes 02/02/2022 0.03  0.00 - 0.07 K/uL Final   Performed at [facility], [place] [address] [place], [place] [address]   Glucose-Capillary 02/02/2022 95  70 - 99 mg/dL Final   Glucose reference range applies only to samples taken after fasting for at least 8 hours.   Glucose-Capillary 02/02/2022 120 (H)  70 - 99 mg/dL Final   Glucose reference range applies only to samples taken after fasting for at least 8 hours.   Glucose-Capillary 02/02/2022 191 (H)  70 - 99 mg/dL Final   Glucose reference range applies only to samples taken after fasting for at least 8 hours.   Glucose-Capillary 02/03/2022 148 (H)  70 - 99 mg/dL Final   Glucose reference range applies only to samples taken after fasting for at least 8 hours.   Glucose-Capillary 02/03/2022 122 (H)  70 - 99 mg/dL  Final   Glucose reference range applies only to samples taken after fasting for at least 8 hours.   Glucose-Capillary 02/03/2022 233 (H)  70 - 99 mg/dL Final   Glucose reference range applies only to samples taken after fasting for at least 8 hours.   Glucose-Capillary 02/04/2022 126 (H)  70 - 99 mg/dL Final   Glucose reference range applies only to samples taken after fasting for at least 8 hours.   Glucose-Capillary 02/04/2022 117 (H)  70 - 99 mg/dL Final   Glucose reference range applies only to samples taken after fasting for at least 8 hours.   Glucose-Capillary 02/04/2022 135 (H)  70 - 99 mg/dL Final   Glucose reference range applies only to samples taken after fasting for at least 8 hours.   Glucose-Capillary 02/04/2022 197 (H)  70 - 99 mg/dL Final   Glucose reference range applies only to samples taken after fasting for at least 8 hours.   Glucose-Capillary 02/05/2022 170 (H)  70 - 99 mg/dL Final   Glucose reference range applies only to samples taken after fasting for at least 8 hours.   Glucose-Capillary 02/05/2022 107 (H)  70 - 99 mg/dL Final   Glucose reference range applies only to samples taken after fasting for at least 8 hours.   Glucose-Capillary 02/05/2022 184 (H)  70 - 99 mg/dL Final   Glucose reference range applies only to samples taken after fasting for at least 8 hours.   Glucose-Capillary 02/05/2022 256 (H)  70 - 99 mg/dL Final   Glucose reference range applies only to samples taken after fasting for at least 8 hours.   Glucose-Capillary 02/06/2022 144 (H)  70 - 99 mg/dL Final   Glucose reference range applies only to samples taken after fasting for at least 8 hours.   Glucose-Capillary 02/06/2022 190 (H)  70 - 99 mg/dL Final   Glucose reference range applies only to samples taken after fasting for at least 8 hours.   Glucose-Capillary 02/06/2022 92  70 - 99 mg/dL Final   Glucose reference range applies only to samples taken after fasting for at least 8 hours.    Trichomonas 02/06/2022  Negative   Final   Bacterial Vaginitis-Urine 02/06/2022 Negative   Final   Molecular Comment 02/06/2022 For tests bacteria and/or candida, this specimen does not meet the   Final   Molecular Comment 02/06/2022 strict criteria set by the FDA. The result interpretation should be   Final   Molecular Comment 02/06/2022 considered in conjunction with the patient's clinical history.   Final   Comment 02/06/2022 Normal Reference Range Trichomonas - Negative   Final   Glucose-Capillary 02/06/2022 197 (H)  70 - 99 mg/dL Final   Glucose reference range applies only to samples taken after fasting for at least 8 hours.   Glucose-Capillary 02/07/2022 129 (H)  70 - 99 mg/dL Final   Glucose reference range applies only to samples taken after fasting for at least 8 hours.   Glucose-Capillary 02/07/2022 207 (H)  70 - 99 mg/dL Final   Glucose reference range applies only to samples taken after fasting for at least 8 hours.   Glucose-Capillary 02/07/2022 153 (H)  70 - 99 mg/dL Final   Glucose reference range applies only to samples taken after fasting for at least 8 hours.   Glucose-Capillary 02/08/2022 129 (H)  70 - 99 mg/dL Final   Glucose reference range applies only to samples taken after fasting for at least 8 hours.   Glucose-Capillary 02/08/2022 107 (H)  70 - 99 mg/dL Final   Glucose reference range applies only to samples taken after fasting for at least 8 hours.  Admission on 01/23/2022, Discharged on 01/24/2022  Component Date Value Ref Range Status   Sodium 01/23/2022 140  135 - 145 mmol/L Final   Potassium 01/23/2022 4.4  3.5 - 5.1 mmol/L Final   Chloride 01/23/2022 101  98 - 111 mmol/L Final   CO2 01/23/2022 25  22 - 32 mmol/L Final   Glucose, Bld 01/23/2022 198 (H)  70 - 99 mg/dL Final   Glucose reference range applies only to samples taken after fasting for at least 8 hours.   BUN 01/23/2022 13  6 - 20 mg/dL Final   Creatinine, Ser 01/23/2022 0.62  0.44 - 1.00 mg/dL  Final   Calcium 01/23/2022 9.3  8.9 - 10.3 mg/dL Final   GFR, Estimated 01/23/2022 >60  >60 mL/min Final   Comment: (NOTE) Calculated using the CKD-EPI Creatinine Equation (2021)    Anion gap 01/23/2022 14  5 - 15 Final   Performed at [facility], [place] [address], [place], [place] [address]   WBC 01/23/2022 5.8  4.0 - 10.5 K/uL Final   RBC 01/23/2022 4.63  3.87 - 5.11 MIL/uL Final   Hemoglobin 01/23/2022 11.7 (L)  12.0 - 15.0 g/dL Final   HCT 01/23/2022 37.4  36.0 - 46.0 % Final   MCV 01/23/2022 80.8  80.0 - 100.0 fL Final   MCH 01/23/2022 25.3 (L)  26.0 - 34.0 pg Final   MCHC 01/23/2022 31.3  30.0 - 36.0 g/dL Final   RDW 01/23/2022 17.9 (H)  11.5 - 15.5 % Final   Platelets 01/23/2022 333  150 - 400 K/uL Final   nRBC 01/23/2022 0.0  0.0 - 0.2 % Final   Performed at [facility] [address] [place], [place] [address]   Troponin I (High Sensitivity) 01/23/2022 6  <18 ng/L Final   Comment: (NOTE) Elevated high sensitivity troponin I (hsTnI) values and significant  changes across serial measurements may suggest ACS but many other  chronic and acute conditions are known to elevate hsTnI results.  Refer to the "Links" section for chest pain algorithms and additional  guidance. Performed at [facility], [place] [address] [place], [place] [address]    Troponin I (High Sensitivity) 01/23/2022 5  <18 ng/L Final   Comment: (NOTE) Elevated high sensitivity troponin I (hsTnI) values and significant  changes across serial measurements may suggest ACS but many other  chronic and acute conditions are known to elevate hsTnI results.  Refer to the "Links" section for chest pain algorithms and additional  guidance. Performed at [facility], [place] [address], [place], [place] [address]    SARS Coronavirus 2 by RT PCR 01/23/2022 NEGATIVE  NEGATIVE Final   Comment: (NOTE) SARS-CoV-2 target nucleic acids are NOT DETECTED.  The SARS-CoV-2 RNA is generally detectable in  upper and lower respiratory specimens during the acute phase of infection. The lowest concentration of SARS-CoV-2 viral copies this assay can detect is 250 copies / mL. A negative result does not preclude SARS-CoV-2 infection and should not be used as the sole basis for treatment or other patient management decisions.  A negative result may occur with improper specimen collection / handling, submission of specimen other than nasopharyngeal swab, presence of viral mutation(s) within the areas targeted by this assay, and inadequate number of viral copies (<250 copies / mL). A negative result must be combined with clinical observations, patient history, and epidemiological information.  Fact Sheet for Patients:   https://www.patel.info/  Fact Sheet for Healthcare Providers: https://hall.com/  This test is not yet approved or                           cleared by the [place] FDA and has been authorized for detection and/or diagnosis of SARS-CoV-2 by FDA under an Emergency Use Authorization (EUA).  This EUA will remain in effect (meaning this test can be used) for the duration of the COVID-19 declaration under Section 564(b)(1) of the Act, 21 U.S.C. section 360bbb-3(b)(1), unless the authorization is terminated or revoked sooner.  Performed at [facility], [place] [address] [place], [place] [address]    B Natriuretic Peptide 01/23/2022 41.3  0.0 - 100.0 pg/mL Final   Performed at [place] [address], [place], [place] [address]   Group A Strep by PCR 01/23/2022 NOT DETECTED  NOT DETECTED Final   Performed at [facility], [place] [address], [place], [place] [address]   Color, Urine 01/23/2022 YELLOW  YELLOW Final   APPearance 01/23/2022 CLEAR  CLEAR Final   Specific Gravity, Urine 01/23/2022 1.018  1.005 - 1.030 Final   pH 01/23/2022 7.0  5.0 - 8.0 Final   Glucose, UA 01/23/2022 NEGATIVE  NEGATIVE mg/dL Final   Hgb  urine dipstick 01/23/2022 NEGATIVE  NEGATIVE Final   Bilirubin Urine 01/23/2022 NEGATIVE  NEGATIVE Final   Ketones, ur 01/23/2022 NEGATIVE  NEGATIVE mg/dL Final   Protein, ur 01/23/2022 NEGATIVE  NEGATIVE mg/dL Final   Nitrite 01/23/2022 NEGATIVE  NEGATIVE Final   Leukocytes,Ua 01/23/2022 TRACE (A)  NEGATIVE Final   RBC / HPF 01/23/2022 0-5  0 - 5 RBC/hpf Final   WBC, UA 01/23/2022 0-5  0 - 5 WBC/hpf Final   Bacteria, UA 01/23/2022 NONE SEEN  NONE SEEN Final   Squamous Epithelial / LPF 01/23/2022 0-5  0 - 5 Final   Mucus 01/23/2022 PRESENT   Final   Performed at [facility], [place] [address] [place], [place]  [address]   SARS Coronavirus 2 by RT PCR 01/23/2022 NEGATIVE  NEGATIVE Final   Comment: (NOTE) SARS-CoV-2 target nucleic acids are NOT DETECTED.  The SARS-CoV-2 RNA is generally detectable in upper respiratory specimens during the acute phase of infection. The lowest concentration of SARS-CoV-2 viral copies this assay can detect is 138 copies/mL. A negative result does not preclude SARS-Cov-2 infection and should not be used as the sole basis for treatment or other patient management decisions. A negative result may occur with  improper specimen collection/handling, submission of specimen other than nasopharyngeal swab, presence of viral mutation(s) within the areas targeted by this assay, and inadequate number of viral copies(<138 copies/mL). A negative result must be combined with clinical observations, patient history, and epidemiological information. The expected result is Negative.  Fact Sheet for Patients:  EntrepreneurPulse.com.au  Fact Sheet for Healthcare Providers:  IncredibleEmployment.be  This test is no                          t yet approved or cleared by the [place] FDA and  has been authorized for detection and/or diagnosis of SARS-CoV-2 by FDA under an Emergency Use Authorization (EUA). This EUA will remain  in  effect (meaning this test can be used) for the duration of the COVID-19 declaration under Section 564(b)(1) of the Act, 21 U.S.C.section 360bbb-3(b)(1), unless the authorization is terminated  or revoked sooner.       Influenza A by PCR 01/23/2022 NEGATIVE  NEGATIVE Final   Influenza B by PCR 01/23/2022 NEGATIVE  NEGATIVE Final   Comment: (NOTE) The Xpert Xpress SARS-CoV-2/FLU/RSV plus assay is intended as an aid in the diagnosis of influenza from Nasopharyngeal swab specimens and should not be used as a sole basis for treatment. Nasal washings and aspirates are unacceptable for Xpert Xpress SARS-CoV-2/FLU/RSV testing.  Fact Sheet for Patients: EntrepreneurPulse.com.au  Fact Sheet for Healthcare Providers: IncredibleEmployment.be  This test is not yet approved or cleared by the [place] FDA and has been authorized for detection and/or diagnosis of SARS-CoV-2 by FDA under an Emergency Use Authorization (EUA). This EUA will remain in effect (meaning this test can be used) for the duration of the COVID-19 declaration under Section 564(b)(1) of the Act, 21 U.S.C. section 360bbb-3(b)(1), unless the authorization is terminated or revoked.  Performed at [facility], [place] [address], [place], [place] [address]    WBC 01/24/2022 6.1  4.0 - 10.5 K/uL Final   RBC 01/24/2022 4.60  3.87 - 5.11 MIL/uL Final   Hemoglobin 01/24/2022 11.7 (L)  12.0 - 15.0 g/dL Final   HCT 01/24/2022 37.0  36.0 - 46.0 % Final   MCV 01/24/2022 80.4  80.0 - 100.0 fL Final   MCH 01/24/2022 25.4 (L)  26.0 - 34.0 pg Final   MCHC 01/24/2022 31.6  30.0 - 36.0 g/dL Final   RDW 01/24/2022 17.6 (H)  11.5 - 15.5 % Final   Platelets 01/24/2022 334  150 - 400 K/uL Final   nRBC 01/24/2022 0.0  0.0 - 0.2 % Final   Neutrophils Relative % 01/24/2022 53  % Final   Neutro Abs 01/24/2022 3.3  1.7 - 7.7 K/uL Final   Lymphocytes Relative 01/24/2022 33  % Final   Lymphs Abs  01/24/2022 2.0  0.7 - 4.0 K/uL Final   Monocytes Relative 01/24/2022 11  % Final   Monocytes Absolute 01/24/2022 0.7  0.1 - 1.0 K/uL Final   Eosinophils Relative 01/24/2022 2  %  Final   Eosinophils Absolute 01/24/2022 0.1  0.0 - 0.5 K/uL Final   Basophils Relative 01/24/2022 1  % Final   Basophils Absolute 01/24/2022 0.1  0.0 - 0.1 K/uL Final   Immature Granulocytes 01/24/2022 0  % Final   Abs Immature Granulocytes 01/24/2022 0.02  0.00 - 0.07 K/uL Final   Performed at [facility], [place] [address], [place], [place] [address]   Glucose-Capillary 01/24/2022 110 (H)  70 - 99 mg/dL Final   Glucose reference range applies only to samples taken after fasting for at least 8 hours.  No results displayed because visit has over 200 results.    Admission on 11/22/2021, Discharged on 11/22/2021  Component Date Value Ref Range Status   Glucose-Capillary 11/22/2021 169 (H)  70 - 99 mg/dL Final   Glucose reference range applies only to samples taken after fasting for at least 8 hours.  No results displayed because visit has over 200 results.    Admission on 10/04/2021, Discharged on 10/22/2021  Component Date Value Ref Range Status   SARS Coronavirus 2 by RT PCR 10/04/2021 NEGATIVE  NEGATIVE Final   Comment: (NOTE) SARS-CoV-2 target nucleic acids are NOT DETECTED.  The SARS-CoV-2 RNA is generally detectable in upper respiratory specimens during the acute phase of infection. The lowest concentration of SARS-CoV-2 viral copies this assay can detect is 138 copies/mL. A negative result does not preclude SARS-Cov-2 infection and should not be used as the sole basis for treatment or other patient management decisions. A negative result may occur with  improper specimen collection/handling, submission of specimen other than nasopharyngeal swab, presence of viral mutation(s) within the areas targeted by this assay, and inadequate number of viral copies(<138 copies/mL). A negative result must be  combined with clinical observations, patient history, and epidemiological information. The expected result is Negative.  Fact Sheet for Patients:  EntrepreneurPulse.com.au  Fact Sheet for Healthcare Providers:  IncredibleEmployment.be  This test is no                          t yet approved or cleared by the [place] FDA and  has been authorized for detection and/or diagnosis of SARS-CoV-2 by FDA under an Emergency Use Authorization (EUA). This EUA will remain  in effect (meaning this test can be used) for the duration of the COVID-19 declaration under Section 564(b)(1) of the Act, 21 U.S.C.section 360bbb-3(b)(1), unless the authorization is terminated  or revoked sooner.       Influenza A by PCR 10/04/2021 NEGATIVE  NEGATIVE Final   Influenza B by PCR 10/04/2021 NEGATIVE  NEGATIVE Final   Comment: (NOTE) The Xpert Xpress SARS-CoV-2/FLU/RSV plus assay is intended as an aid in the diagnosis of influenza from Nasopharyngeal swab specimens and should not be used as a sole basis for treatment. Nasal washings and aspirates are unacceptable for Xpert Xpress SARS-CoV-2/FLU/RSV testing.  Fact Sheet for Patients: EntrepreneurPulse.com.au  Fact Sheet for Healthcare Providers: IncredibleEmployment.be  This test is not yet approved or cleared by the [place] FDA and has been authorized for detection and/or diagnosis of SARS-CoV-2 by FDA under an Emergency Use Authorization (EUA). This EUA will remain in effect (meaning this test can be used) for the duration of the COVID-19 declaration under Section 564(b)(1) of the Act, 21 U.S.C. section 360bbb-3(b)(1), unless the authorization is terminated or revoked.  Performed at [facility], [place] [address] [place], [place] [address]    WBC 10/04/2021 8.4  4.0 - 10.5 K/uL Final   RBC 10/04/2021 4.42  3.87 - 5.11 MIL/uL Final   Hemoglobin 10/04/2021 11.6  (L)  12.0 - 15.0 g/dL Final   HCT 10/04/2021 35.7 (L)  36.0 - 46.0 % Final   MCV 10/04/2021 80.8  80.0 - 100.0 fL Final   MCH 10/04/2021 26.2  26.0 - 34.0 pg Final   MCHC 10/04/2021 32.5  30.0 - 36.0 g/dL Final   RDW 10/04/2021 16.0 (H)  11.5 - 15.5 % Final   Platelets 10/04/2021 372  150 - 400 K/uL Final   nRBC 10/04/2021 0.0  0.0 - 0.2 % Final   Neutrophils Relative % 10/04/2021 68  % Final   Neutro Abs 10/04/2021 5.7  1.7 - 7.7 K/uL Final   Lymphocytes Relative 10/04/2021 22  % Final   Lymphs Abs 10/04/2021 1.8  0.7 - 4.0 K/uL Final   Monocytes Relative 10/04/2021 8  % Final   Monocytes Absolute 10/04/2021 0.7  0.1 - 1.0 K/uL Final   Eosinophils Relative 10/04/2021 1  % Final   Eosinophils Absolute 10/04/2021 0.1  0.0 - 0.5 K/uL Final   Basophils Relative 10/04/2021 1  % Final   Basophils Absolute 10/04/2021 0.1  0.0 - 0.1 K/uL Final   Immature Granulocytes 10/04/2021 0  % Final   Abs Immature Granulocytes 10/04/2021 0.03  0.00 - 0.07 K/uL Final   Performed at [facility], [place] [address] [place], [place] [address]   Sodium 10/04/2021 136  135 - 145 mmol/L Final   Potassium 10/04/2021 4.2  3.5 - 5.1 mmol/L Final   Chloride 10/04/2021 104  98 - 111 mmol/L Final   CO2 10/04/2021 25  22 - 32 mmol/L Final   Glucose, Bld 10/04/2021 100 (H)  70 - 99 mg/dL Final   Glucose reference range applies only to samples taken after fasting for at least 8 hours.   BUN 10/04/2021 9  6 - 20 mg/dL Final   Creatinine, Ser 10/04/2021 0.52  0.44 - 1.00 mg/dL Final   Calcium 10/04/2021 9.0  8.9 - 10.3 mg/dL Final   Total Protein 10/04/2021 7.0  6.5 - 8.1 g/dL Final   Albumin 10/04/2021 3.2 (L)  3.5 - 5.0 g/dL Final   AST 10/04/2021 13 (L)  15 - 41 U/L Final   ALT 10/04/2021 10  0 - 44 U/L Final   Alkaline Phosphatase 10/04/2021 61  38 - 126 U/L Final   Total Bilirubin 10/04/2021 0.3  0.3 - 1.2 mg/dL Final   GFR, Estimated 10/04/2021 >60  >60 mL/min Final   Comment: (NOTE) Calculated using the  CKD-EPI Creatinine Equation (2021)    Anion gap 10/04/2021 7  5 - 15 Final   Performed at [facility] [address] [place], [place] [address]   Hgb A1c MFr Bld 10/04/2021 7.0 (H)  4.8 - 5.6 % Final   Comment: (NOTE) Pre diabetes:          5.7%-6.4%  Diabetes:              >6.4%  Glycemic control for   <7.0% adults with diabetes    Mean Plasma Glucose 10/04/2021 154.2  mg/dL Final   Performed at [facility], [place] [address], [place], [place] [address]   Cholesterol 10/04/2021 178  0 - 200 mg/dL Final   Triglycerides 10/04/2021 155 (H)  <150 mg/dL Final   HDL 10/04/2021 52  >40 mg/dL Final   Total CHOL/HDL Ratio 10/04/2021 3.4  RATIO Final   VLDL 10/04/2021 31  0 - 40 mg/dL Final   LDL Cholesterol 10/04/2021 95  0 - 99 mg/dL Final   Comment:        Total Cholesterol/HDL:CHD Risk Coronary Heart Disease Risk Table                     Men   Women  1/2 Average Risk   3.4   3.3  Average Risk       5.0   4.4  2 X Average Risk   9.6   7.1  3 X Average Risk  23.4   11.0        Use the calculated Patient Ratio above and the CHD Risk Table to determine the patient's CHD Risk.        ATP III CLASSIFICATION (LDL):  <100     mg/dL   Optimal  100-129  mg/dL   Near or Above                    Optimal  130-159  mg/dL   Borderline  160-189  mg/dL   High  >190     mg/dL   Very High Performed at [facility] [address], [place], [place] [address]    POC Amphetamine UR 10/04/2021 None Detected  NONE DETECTED (Cut Off Level 1000 ng/mL) Final   POC Secobarbital (BAR) 10/04/2021 None Detected  NONE DETECTED (Cut Off Level 300 ng/mL) Final   POC Buprenorphine (BUP) 10/04/2021 None Detected  NONE DETECTED (Cut Off Level 10 ng/mL) Final   POC Oxazepam (BZO) 10/04/2021 None Detected  NONE DETECTED (Cut Off Level 300 ng/mL) Final   POC Cocaine UR 10/04/2021 None Detected  NONE DETECTED (Cut Off Level 300 ng/mL) Final   POC Methamphetamine UR 10/04/2021 None Detected   NONE DETECTED (Cut Off Level 1000 ng/mL) Final   POC Morphine 10/04/2021 None Detected  NONE DETECTED (Cut Off Level 300 ng/mL) Final   POC Methadone UR 10/04/2021 None Detected  NONE DETECTED (Cut Off Level 300 ng/mL) Final   POC Oxycodone UR 10/04/2021 Positive (A)  NONE DETECTED (Cut Off Level 100 ng/mL) Final   POC Marijuana UR 10/04/2021 None Detected  NONE DETECTED (Cut Off Level 50 ng/mL) Final   SARSCOV2ONAVIRUS 2 AG 10/04/2021 NEGATIVE  NEGATIVE Final   Comment: (NOTE) SARS-CoV-2 antigen NOT DETECTED.   Negative results are presumptive.  Negative results do not preclude SARS-CoV-2 infection and should not be used as the sole basis for treatment or other patient management decisions, including infection  control decisions, particularly in the presence of clinical signs and  symptoms consistent with COVID-19, or in those who have been in contact with the virus.  Negative results must be combined with clinical observations, patient history, and epidemiological information. The expected result is Negative.  Fact Sheet for Patients: HandmadeRecipes.com.cy  Fact Sheet for Healthcare Providers: FuneralLife.at  This test is not yet approved or cleared by the [place] FDA and  has been authorized for detection and/or diagnosis of SARS-CoV-2 by FDA under an Emergency Use Authorization (EUA).  This EUA will remain in effect (meaning this test can be used) for the duration of  the COV                          ID-19 declaration under Section 564(b)(1) of the Act, 21 U.S.C. section 360bbb-3(b)(1), unless the authorization is terminated or revoked  sooner.     Valproic Acid Lvl 10/04/2021 51  50.0 - 100.0 ug/mL Final   Performed at [facility] [address] [place], [place] [address]   Valproic Acid Lvl 10/08/2021 57  50.0 - 100.0 ug/mL Final   Performed at [facility] [address], [place], [place] [address]    Glucose-Capillary 10/09/2021 123 (H)  70 - 99 mg/dL Final   Glucose reference range applies only to samples taken after fasting for at least 8 hours.  Admission on 09/09/2021, Discharged on 09/10/2021  Component Date Value Ref Range Status   Sodium 09/09/2021 134 (L)  135 - 145 mmol/L Final   Potassium 09/09/2021 4.3  3.5 - 5.1 mmol/L Final   Chloride 09/09/2021 99  98 - 111 mmol/L Final   CO2 09/09/2021 25  22 - 32 mmol/L Final   Glucose, Bld 09/09/2021 107 (H)  70 - 99 mg/dL Final   Glucose reference range applies only to samples taken after fasting for at least 8 hours.   BUN 09/09/2021 12  6 - 20 mg/dL Final   Creatinine, Ser 09/09/2021 0.74  0.44 - 1.00 mg/dL Final   Calcium 09/09/2021 9.0  8.9 - 10.3 mg/dL Final   Total Protein 09/09/2021 7.4  6.5 - 8.1 g/dL Final   Albumin 09/09/2021 3.1 (L)  3.5 - 5.0 g/dL Final   AST 09/09/2021 13 (L)  15 - 41 U/L Final   ALT 09/09/2021 13  0 - 44 U/L Final   Alkaline Phosphatase 09/09/2021 66  38 - 126 U/L Final   Total Bilirubin 09/09/2021 0.2 (L)  0.3 - 1.2 mg/dL Final   GFR, Estimated 09/09/2021 >60  >60 mL/min Final   Comment: (NOTE) Calculated using the CKD-EPI Creatinine Equation (2021)    Anion gap 09/09/2021 10  5 - 15 Final   Performed at [facility], [place] [address] [place], [place] [address]   WBC 09/09/2021 8.5  4.0 - 10.5 K/uL Final   RBC 09/09/2021 4.53  3.87 - 5.11 MIL/uL Final   Hemoglobin 09/09/2021 11.8 (L)  12.0 - 15.0 g/dL Final   HCT 09/09/2021 38.0  36.0 - 46.0 % Final   MCV 09/09/2021 83.9  80.0 - 100.0 fL Final   MCH 09/09/2021 26.0  26.0 - 34.0 pg Final   MCHC 09/09/2021 31.1  30.0 - 36.0 g/dL Final   RDW 09/09/2021 17.8 (H)  11.5 - 15.5 % Final   Platelets 09/09/2021 442 (H)  150 - 400 K/uL Final   nRBC 09/09/2021 0.0  0.0 - 0.2 % Final   Neutrophils Relative % 09/09/2021 70  % Final   Neutro Abs 09/09/2021 5.9  1.7 - 7.7 K/uL Final   Lymphocytes Relative 09/09/2021 20  % Final   Lymphs Abs 09/09/2021  1.7  0.7 - 4.0 K/uL Final   Monocytes Relative 09/09/2021 8  % Final   Monocytes Absolute 09/09/2021 0.7  0.1 - 1.0 K/uL Final   Eosinophils Relative 09/09/2021 1  % Final   Eosinophils Absolute 09/09/2021 0.1  0.0 - 0.5 K/uL Final   Basophils Relative 09/09/2021 1  % Final   Basophils Absolute 09/09/2021 0.1  0.0 - 0.1 K/uL Final   Immature Granulocytes 09/09/2021 0  % Final   Abs Immature Granulocytes 09/09/2021 0.03  0.00 - 0.07 K/uL Final   Performed at [facility], [place] [address] [place], [place] [address]   Ammonia 09/09/2021 37 (H)  9 - 35 umol/L  Final   Comment: HEMOLYSIS AT THIS LEVEL MAY AFFECT RESULT Performed at [facility], [place] [address], [place], [place] [address]    Opiates 09/09/2021 NONE DETECTED  NONE DETECTED Final   Cocaine 09/09/2021 NONE DETECTED  NONE DETECTED Final   Benzodiazepines 09/09/2021 NONE DETECTED  NONE DETECTED Final   Amphetamines 09/09/2021 NONE DETECTED  NONE DETECTED Final   Tetrahydrocannabinol 09/09/2021 NONE DETECTED  NONE DETECTED Final   Barbiturates 09/09/2021 NONE DETECTED  NONE DETECTED Final   Comment: (NOTE) DRUG SCREEN FOR MEDICAL PURPOSES ONLY.  IF CONFIRMATION IS NEEDED FOR ANY PURPOSE, NOTIFY LAB WITHIN 5 DAYS.  LOWEST DETECTABLE LIMITS FOR URINE DRUG SCREEN Drug Class                     Cutoff (ng/mL) Amphetamine and metabolites    1000 Barbiturate and metabolites    200 Benzodiazepine                 532 Tricyclics and metabolites     300 Opiates and metabolites        300 Cocaine and metabolites        300 THC                            50 Performed at [facility], [place] [address] [place], [place] [address]    Alcohol, Ethyl (B) 09/09/2021 <10  <10 mg/dL Final   Comment: (NOTE) Lowest detectable limit for serum alcohol is 10 mg/dL.  For medical purposes only. Performed at [facility], [place] [address], [place], [place] [address]    Lipase 09/09/2021 33  11 - 51 U/L Final   Performed at  [place] [address] [place], [place] [address]   Color, Urine 09/09/2021 COLORLESS (A)  YELLOW Final   APPearance 09/09/2021 CLEAR  CLEAR Final   Specific Gravity, Urine 09/09/2021 1.002 (L)  1.005 - 1.030 Final   pH 09/09/2021 6.0  5.0 - 8.0 Final   Glucose, UA 09/09/2021 NEGATIVE  NEGATIVE mg/dL Final   Hgb urine dipstick 09/09/2021 NEGATIVE  NEGATIVE Final   Bilirubin Urine 09/09/2021 NEGATIVE  NEGATIVE Final   Ketones, ur 09/09/2021 NEGATIVE  NEGATIVE mg/dL Final   Protein, ur 09/09/2021 NEGATIVE  NEGATIVE mg/dL Final   Nitrite 09/09/2021 NEGATIVE  NEGATIVE Final   Leukocytes,Ua 09/09/2021 NEGATIVE  NEGATIVE Final   Performed at [facility] [address] [place], [place] [address]   Specimen Description 09/09/2021 URINE, CLEAN CATCH   Final   Special Requests 09/09/2021 NONE   Final   Culture 09/09/2021  (A)   Final                   Value:<10,000 COLONIES/mL INSIGNIFICANT GROWTH Performed at [facility], [place] [address], [place], [place] [address]    Report Status 09/09/2021 09/10/2021 FINAL   Final   D-Dimer, Quant 09/09/2021 0.92 (H)  0.00 - 0.50 ug/mL-FEU Final   Comment: (NOTE) At the manufacturer cut-off value of 0.5 g/mL FEU, this assay has a negative predictive value of 95-100%.This assay is intended for use in conjunction with a clinical pretest probability (PTP) assessment model to exclude pulmonary embolism (PE) and deep venous thrombosis (DVT) in outpatients suspected of PE or DVT. Results should be correlated with clinical presentation. Performed at [facility], [place] [address], [place], [place] [address]  Troponin I (High Sensitivity) 09/09/2021 5  <18 ng/L Final   Comment: (NOTE) Elevated high sensitivity troponin I (hsTnI) values and significant  changes across serial measurements may suggest ACS but many other  chronic and acute conditions are known to elevate hsTnI results.  Refer to the "Links" section for chest pain  algorithms and additional  guidance. Performed at [facility], [place] [address], [place], [place] [address]    BP 09/10/2021 135/83  mmHg Final   S' Lateral 09/10/2021 2.30  cm Final   Area-P 1/2 09/10/2021 5.58  cm2 Final    Blood Alcohol level:  Lab Results  Component Value Date   ETH <10 11/23/2021   ETH <10 49/44/9675    Metabolic Disorder Labs: Lab Results  Component Value Date   HGBA1C 6.7 (H) 12/28/2021   MPG 145.59 12/28/2021   MPG 154.2 10/04/2021   Lab Results  Component Value Date   PROLACTIN 3.8 (L) 11/23/2021   Lab Results  Component Value Date   CHOL 171 11/23/2021   TRIG 125 11/23/2021   HDL 65 11/23/2021   CHOLHDL 2.6 11/23/2021   VLDL 25 11/23/2021   LDLCALC 81 11/23/2021   LDLCALC 95 10/04/2021    Therapeutic Lab Levels: No results found for: "LITHIUM" Lab Results  Component Value Date   VALPROATE 33 (L) 01/18/2022   VALPROATE 33 (L) 12/28/2021   No results found for: "CBMZ"  Physical Findings   PHQ2-9    Flowsheet Row ED from 11/23/2021 in [facility]  PHQ-2 Total Score 2  PHQ-9 Total Score 3      Flowsheet Row ED from 02/09/2022 in [facility] ED from 02/08/2022 in [facility] ED from 01/24/2022 in [facility] No Risk No Risk No Risk        Musculoskeletal  Strength & Muscle Tone: within normal limits Gait & Station: normal Patient leans: N/A  Psychiatric Specialty Exam  Presentation  General Appearance:  Appropriate for Environment; Casual  Eye Contact: Good  Speech: Clear and Coherent; Pressured  Speech Volume: Normal  Handedness: Right   Mood and Affect  Mood: Euthymic  Affect: Appropriate; Congruent   Thought Process  Thought Processes: Coherent; Goal Directed; Linear  Descriptions of Associations:Intact  Orientation:Full (Time, Place and  Person)  Thought Content:Scattered  Diagnosis of Schizophrenia or Schizoaffective disorder in past: Yes  Duration of Psychotic Symptoms: >6 months  Hallucinations:denies  Ideas of Reference:None  Suicidal Thoughts:denies  Homicidal Thoughts:denies   Sensorium  Memory: Immediate Fair; Recent Fair; Remote Fair  Judgment: Intact  Insight: Present   Executive Functions  Concentration: Fair  Attention Span: Fair  Recall: [place] of Knowledge: Fair  Language: Good   Psychomotor Activity  Psychomotor Activity: Psychomotor Activity: Normal    Assets  Assets: Communication Skills; Desire for Improvement; Financial Resources/Insurance   Sleep  Sleep: appropriate   Physical Exam  Physical Exam Constitutional:      Appearance: Normal appearance.  HENT:     Head: Normocephalic and atraumatic.  Cardiovascular:     Rate and Rhythm: Normal rate and regular rhythm.  Pulmonary:     Effort: Pulmonary effort is normal.  Musculoskeletal:        General: Normal range of motion.  Skin:    General: Skin is warm and dry.  Neurological:     General: No focal deficit present.     Mental Status:  She is alert and oriented to person, place, and time. Mental status is at baseline.    Review of Systems  Respiratory:  Negative for shortness of breath.   Cardiovascular:  Negative for chest pain.  Gastrointestinal:  Negative for abdominal pain, constipation, diarrhea, heartburn, nausea and vomiting.  Neurological:  Negative for headaches.   Blood pressure (!) 101/58, pulse (!) 101, temperature 97.6 F (36.4 C), temperature source Oral, resp. rate 16, SpO2 100 %. There is no height or weight on file to calculate BMI.  Treatment Plan Summary: Daily contact with patient to assess and evaluate symptoms and progress in treatment and Medication management  Medications were restarted on admission with the inclusion of colchicine for pericardial effusion. Appreciate IM  recs upon their assessment yesterday.  Adjusted medications to match what she was taking at [facility]: -Eliquis 5 mg p.o. twice daily A-fib -Abilify 10 mg p.o. daily mood -Vitamin D3 50,000 units p.o. every Thursday Vit D deficiency -Clozaril 75 mg p.o. nightly Schizophrenia -INCREASE Clozaril to 75 mg p.o. 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 daily A-fib -Depakote ER 500 mg p.o. twice daily mood -Haldol 10 mg p.o. 3 times daily as needed agitation, psychotic symptoms -Diabetes mellitus care plan insulin protocol before meals and at bedtime -Ingrezza 40 mg p.o. daily, for Tardive dyskinesia -Metformin 500 mg p.o. twice daily with meals type 2 diabetes -Ferrous sulphate 325 mg Po daily with breakfast for iron deficiency -Start OCEAN nasal spray   Other PRNs -Tylenol 650 mg p.o. every 6 hours as needed pain -Maalox 30 mm p.o. every 4 hours as needed indigestion -Atarax 25 mg p.o. 3 times daily as needed anxiety -MOM 30 mL p.o. daily as needed constipation -Cepacol lozenge 3 mg p.o. as needed sore throat -Trazodone 50 mg p.o. nightly as needed sleep  France Ravens, MD 02/12/2022 8:50 AM

## 2022-02-13 DIAGNOSIS — F209 Schizophrenia, unspecified: Secondary | ICD-10-CM | POA: Diagnosis not present

## 2022-02-13 LAB — GLUCOSE, CAPILLARY
Glucose-Capillary: 168 mg/dL — ABNORMAL HIGH (ref 70–99)
Glucose-Capillary: 153 mg/dL — ABNORMAL HIGH (ref 70–99)
Glucose-Capillary: 134 mg/dL — ABNORMAL HIGH (ref 70–99)
Glucose-Capillary: 178 mg/dL — ABNORMAL HIGH (ref 70–99)

## 2022-02-13 MED ORDER — LATANOPROST 0.005 % OP SOLN
1.0000 [drp] | Freq: Every day | OPHTHALMIC | Status: DC
  Administered 2022-02-13 – 2022-02-16 (×4): 1 [drp] via OPHTHALMIC
  Filled 2022-02-13: qty 2.5

## 2022-02-13 NOTE — ED Provider Notes (Signed)
Behavioral Health Progress Note  Date and Time: 02/13/2022 11:57 AM Name: Teresa Murillo MRN:  [id number]  Per H&P, Teresa Murillo is a 61 y.o. female with a past psychiatric history of schizophrenia, aggressive behavior, and borderline intellectual disability presenting to [facility] on 11/23/21 voluntarily as a walk in via [facility] with complaints that she was locked out of the group home. Patient has been dismissed from group home due to multiple elopements. Pt had already been discharged from this facility but had been living there temporarily while DSS was looking for new placement. Pt is boarding at [facility]   Subjective:  Patient seen and evaluated face to face by this provider, and chart reviewed. On evaluation, patient is lying down resting. She awakens to complete the assessment. She is alert and oriented x 3. However, she does appear somnolent but awakens to voice commands to answer questions. She states that she is tired because the medications make her feel sleepy. Her thought process is linear and speech is coherent at a decreased tone. She denies SI/HI/AVH. There is no objective evidence that the patient is currently responding to internal or external stimuli. She denies depressive symptoms. She reports a good appetite. She reports poor sleep at night. I dicussed with the patient staying awake during the daytime to help promote adequate sleep at night. She states that she is tired of being here and is ready to go. She is medication compliant and denies medication side effects currently. No physical complaints reported.   Diagnosis:  Final diagnoses:  At risk for self care deficit  Schizophrenia, unspecified type ([place])  Noncompliance with medication regimen  Borderline intellectual functioning    Total Time spent with patient: 15 minutes  Past Psychiatric History: history of schizophrenia, aggressive behavior, and borderline intellectual disability  Past Medical History:  Past  Medical History:  Diagnosis Date   Borderline intellectual functioning 12/14/2021   On 12/14/2021: Appreciate assistance from psychology consult. On the Wechsler Adult Intelligence Scale-4, Ms. Teresa Murillo achieved a full-scale IQ score of 73 and a percentile rank of 4 placing her in the borderline range of intellectual functioning.    Chronic obstructive pulmonary disease (COPD) (HCC)    Glaucoma    Hyperlipidemia    Hypertension    Iron deficiency    Schizoaffective disorder (HCC)    Type 2 diabetes mellitus (HCC)     Past Surgical History:  Procedure Laterality Date   TUBAL LIGATION     Family History:  Family History  Problem Relation Age of Onset   Breast cancer Maternal Grandmother    Family Psychiatric  History: No history reported.  Social History:  Social History   Substance and Sexual Activity  Alcohol Use Not Currently     Social History   Substance and Sexual Activity  Drug Use Not Currently    Social History   Socioeconomic History   Marital status: Divorced    Spouse name: Not on file   Number of children: Not on file   Years of education: Not on file   Highest education level: Not on file  Occupational History   Not on file  Tobacco Use   Smoking status: Former    Packs/day: 1.00    Types: Cigars, Cigarettes    Quit date: 11/09/2021    Years since quitting: 0.2   Smokeless tobacco: Current  Vaping Use   Vaping Use: Never used  Substance and Sexual Activity   Alcohol use: Not Currently   Drug use: Not Currently  Sexual activity: Not Currently    Birth control/protection: Surgical  Other Topics Concern   Not on file  Social History Narrative   Not on file   Social Determinants of Health   Financial Resource Strain: Not on file  Food Insecurity: Not on file  Transportation Needs: Not on file  Physical Activity: Not on file  Stress: Not on file  Social Connections: Not on file   SDOH:  SDOH Screenings   Depression (PHQ2-9): Low Risk   (11/23/2021)  Tobacco Use: High Risk (02/08/2022)    Current Medications:  Current Facility-Administered Medications  Medication Dose Route Frequency Provider Last Rate Last Admin   acetaminophen (TYLENOL) tablet 650 mg  650 mg Oral Q6H PRN Onuoha, Chinwendu V, NP   650 mg at 02/11/22 2120   alum & mag hydroxide-simeth (MAALOX/MYLANTA) 200-200-20 MG/5ML suspension 30 mL  30 mL Oral Q4H PRN Onuoha, Chinwendu V, NP   30 mL at 02/10/22 0133   apixaban (ELIQUIS) tablet 5 mg  5 mg Oral BID Onuoha, Chinwendu V, NP   5 mg at 02/13/22 0803   ARIPiprazole (ABILIFY) tablet 10 mg  10 mg Oral Daily Onuoha, Chinwendu V, NP   10 mg at 02/13/22 0803   cloZAPine (CLOZARIL) tablet 75 mg  75 mg Oral Aliene Altes, MD   75 mg at 02/12/22 2120   cloZAPine (CLOZARIL) tablet 75 mg  75 mg Oral Daily France Ravens, MD   75 mg at 02/13/22 0803   colchicine tablet 0.6 mg  0.6 mg Oral QHS Onuoha, Chinwendu V, NP   0.6 mg at 02/12/22 2120   diltiazem (CARDIZEM CD) 24 hr capsule 240 mg  240 mg Oral Daily Onuoha, Chinwendu V, NP   240 mg at 02/13/22 0802   divalproex (DEPAKOTE ER) 24 hr tablet 500 mg  500 mg Oral BID Onuoha, Chinwendu V, NP   500 mg at 02/13/22 0803   ferrous sulfate tablet 325 mg  325 mg Oral Q breakfast Onuoha, Chinwendu V, NP   325 mg at 02/13/22 1610   haloperidol (HALDOL) tablet 5 mg  5 mg Oral TID PRN France Ravens, MD       hydrOXYzine (ATARAX) tablet 25 mg  25 mg Oral TID PRN Onuoha, Chinwendu V, NP   25 mg at 02/11/22 2120   insulin aspart (novoLOG) injection 0-5 Units  0-5 Units Subcutaneous QHS Onuoha, Chinwendu V, NP       insulin aspart (novoLOG) injection 0-9 Units  0-9 Units Subcutaneous TID WC Onuoha, Chinwendu V, NP   2 Units at 02/13/22 1139   latanoprost (XALATAN) 0.005 % ophthalmic solution 1 drop  1 drop Both Eyes QHS Ajibola, Ene A, NP       magnesium hydroxide (MILK OF MAGNESIA) suspension 30 mL  30 mL Oral Daily PRN Onuoha, Chinwendu V, NP       menthol-cetylpyridinium (CEPACOL) lozenge 3  mg  1 lozenge Oral PRN Onuoha, Chinwendu V, NP   3 mg at 02/11/22 1951   metFORMIN (GLUCOPHAGE) tablet 500 mg  500 mg Oral BID WC Onuoha, Chinwendu V, NP   500 mg at 02/13/22 0802   sodium chloride (OCEAN) 0.65 % nasal spray 1 spray  1 spray Each Nare PRN France Ravens, MD       traZODone (DESYREL) tablet 50 mg  50 mg Oral QHS PRN Onuoha, Chinwendu V, NP   50 mg at 02/11/22 2120   valbenazine (INGREZZA) capsule 40 mg  40 mg Oral q AM Onuoha,  Chinwendu V, NP   40 mg at 02/13/22 5993   Vitamin D (Ergocalciferol) (DRISDOL) 1.25 MG (50000 UNIT) capsule 50,000 Units  50,000 Units Oral Q7 days Onuoha, Chinwendu V, NP   50,000 Units at 02/11/22 0902   Current Outpatient Medications  Medication Sig Dispense Refill   Accu-Chek Softclix Lancets lancets Use as directed up to four times daily 100 each 0   apixaban (ELIQUIS) 5 MG TABS tablet Take 1 tablet (5 mg total) by mouth 2 (two) times daily. 60 tablet 0   ARIPiprazole (ABILIFY) 10 MG tablet Take 1 tablet (10 mg total) by mouth daily. 30 tablet 0   Blood Glucose Monitoring Suppl (ACCU-CHEK GUIDE) w/Device KIT Use as directed up to four times daily 1 kit 0   budesonide-formoterol (SYMBICORT) 160-4.5 MCG/ACT inhaler Inhale 2 puffs into the lungs in the morning and at bedtime.     Cholecalciferol (VITAMIN D3) 1.25 MG (50000 UT) CAPS Take 50,000 Units by mouth every Thursday.     clozapine (CLOZARIL) 50 MG tablet Take 1 tablet (50 mg total) by mouth daily. 30 tablet 0   colchicine 0.6 MG tablet Take 1 tablet (0.6 mg total) by mouth at bedtime. 30 tablet 0   diltiazem (CARDIZEM CD) 240 MG 24 hr capsule Take 1 capsule (240 mg total) by mouth daily. (Patient not taking: Reported on 11/24/2021) 30 capsule 0   divalproex (DEPAKOTE ER) 500 MG 24 hr tablet Take 1 tablet (500 mg total) by mouth 2 (two) times daily. 60 tablet 0   glucose blood test strip Use as directed up to four times daily 50 each 0   haloperidol (HALDOL) 10 MG tablet Take 1 tablet (10 mg total) by  mouth 3 (three) times daily as needed for agitation (and psychotic symptoms).     INGREZZA 40 MG capsule Take 1 capsule (40 mg total) by mouth in the morning. 30 capsule 0   insulin aspart (NOVOLOG) 100 UNIT/ML FlexPen Before each meal 3 times a day, 140-199 - 2 units, 200-250 - 4 units, 251-299 - 6 units,  300-349 - 8 units,  350 or above 10 units. Insulin PEN if approved, provide syringes and needles if needed.Please switch to any approved short acting Insulin if needed. 15 mL 0   insulin glargine (LANTUS) 100 UNIT/ML Solostar Pen Inject 12 Units into the skin daily. 15 mL 0   Insulin Pen Needle 32G X 4 MM MISC Use 4 times a day with insulin, 1 month supply. 100 each 0   latanoprost (XALATAN) 0.005 % ophthalmic solution Place 1 drop into both eyes at bedtime.     metFORMIN (GLUCOPHAGE) 500 MG tablet Take 500 mg by mouth 2 (two) times daily with a meal.     nicotine (NICODERM CQ - DOSED IN MG/24 HR) 7 mg/24hr patch Place 1 patch (7 mg total) onto the skin daily. 28 patch 0   PROAIR HFA 108 (90 Base) MCG/ACT inhaler Inhale 2 puffs into the lungs every 6 (six) hours as needed for wheezing or shortness of breath.      Labs  Lab Results:  Admission on 02/09/2022  Component Date Value Ref Range Status   Glucose-Capillary 02/09/2022 118 (H)  70 - 99 mg/dL Final   Glucose reference range applies only to samples taken after fasting for at least 8 hours.   Glucose-Capillary 02/10/2022 148 (H)  70 - 99 mg/dL Final   Glucose reference range applies only to samples taken after fasting for at least 8 hours.  Glucose-Capillary 02/10/2022 174 (H)  70 - 99 mg/dL Final   Glucose reference range applies only to samples taken after fasting for at least 8 hours.   Glucose-Capillary 02/10/2022 128 (H)  70 - 99 mg/dL Final   Glucose reference range applies only to samples taken after fasting for at least 8 hours.   Glucose-Capillary 02/10/2022 182 (H)  70 - 99 mg/dL Final   Glucose reference range applies only  to samples taken after fasting for at least 8 hours.   Glucose-Capillary 02/11/2022 158 (H)  70 - 99 mg/dL Final   Glucose reference range applies only to samples taken after fasting for at least 8 hours.   Glucose-Capillary 02/11/2022 159 (H)  70 - 99 mg/dL Final   Glucose reference range applies only to samples taken after fasting for at least 8 hours.   Glucose-Capillary 02/11/2022 153 (H)  70 - 99 mg/dL Final   Glucose reference range applies only to samples taken after fasting for at least 8 hours.   Glucose-Capillary 02/11/2022 159 (H)  70 - 99 mg/dL Final   Glucose reference range applies only to samples taken after fasting for at least 8 hours.   Glucose-Capillary 02/11/2022 153 (H)  70 - 99 mg/dL Final   Glucose reference range applies only to samples taken after fasting for at least 8 hours.   Glucose-Capillary 02/11/2022 109 (H)  70 - 99 mg/dL Final   Glucose reference range applies only to samples taken after fasting for at least 8 hours.   Glucose-Capillary 02/11/2022 213 (H)  70 - 99 mg/dL Final   Glucose reference range applies only to samples taken after fasting for at least 8 hours.   Glucose-Capillary 02/11/2022 229 (H)  70 - 99 mg/dL Final   Glucose reference range applies only to samples taken after fasting for at least 8 hours.   Glucose-Capillary 02/12/2022 128 (H)  70 - 99 mg/dL Final   Glucose reference range applies only to samples taken after fasting for at least 8 hours.   Glucose-Capillary 02/12/2022 149 (H)  70 - 99 mg/dL Final   Glucose reference range applies only to samples taken after fasting for at least 8 hours.   Color, Urine 02/12/2022 YELLOW  YELLOW Final   APPearance 02/12/2022 CLEAR  CLEAR Final   Specific Gravity, Urine 02/12/2022 1.015  1.005 - 1.030 Final   pH 02/12/2022 5.0  5.0 - 8.0 Final   Glucose, UA 02/12/2022 NEGATIVE  NEGATIVE mg/dL Final   Hgb urine dipstick 02/12/2022 NEGATIVE  NEGATIVE Final   Bilirubin Urine 02/12/2022 NEGATIVE   NEGATIVE Final   Ketones, ur 02/12/2022 NEGATIVE  NEGATIVE mg/dL Final   Protein, ur 02/12/2022 NEGATIVE  NEGATIVE mg/dL Final   Nitrite 02/12/2022 NEGATIVE  NEGATIVE Final   Leukocytes,Ua 02/12/2022 NEGATIVE  NEGATIVE Final   Performed at [facility], [place] [address], [place], [place] [address]   Glucose-Capillary 02/12/2022 128 (H)  70 - 99 mg/dL Final   Glucose reference range applies only to samples taken after fasting for at least 8 hours.   Glucose-Capillary 02/12/2022 196 (H)  70 - 99 mg/dL Final   Glucose reference range applies only to samples taken after fasting for at least 8 hours.   Glucose-Capillary 02/13/2022 168 (H)  70 - 99 mg/dL Final   Glucose reference range applies only to samples taken after fasting for at least 8 hours.   Glucose-Capillary 02/13/2022 153 (H)  70 - 99 mg/dL Final   Glucose reference range  applies only to samples taken after fasting for at least 8 hours.  Admission on 02/08/2022, Discharged on 02/09/2022  Component Date Value Ref Range Status   B Natriuretic Peptide 02/08/2022 40.2  0.0 - 100.0 pg/mL Final   Performed at [facility], [address], [place], [place] [address]   Troponin I (High Sensitivity) 02/08/2022 4  <18 ng/L Final   Comment: (NOTE) Elevated high sensitivity troponin I (hsTnI) values and significant  changes across serial measurements may suggest ACS but many other  chronic and acute conditions are known to elevate hsTnI results.  Refer to the "Links" section for chest pain algorithms and additional  guidance. Performed at [facility], [place] [address], [place], [place] [address]    WBC 02/08/2022 8.5  4.0 - 10.5 K/uL Final   RBC 02/08/2022 4.30  3.87 - 5.11 MIL/uL Final   Hemoglobin 02/08/2022 10.7 (L)  12.0 - 15.0 g/dL Final   HCT 02/08/2022 33.1 (L)  36.0 - 46.0 % Final   MCV 02/08/2022 77.0 (L)  80.0 - 100.0 fL Final   MCH 02/08/2022 24.9 (L)  26.0 - 34.0 pg Final   MCHC 02/08/2022 32.3  30.0 - 36.0  g/dL Final   RDW 02/08/2022 16.5 (H)  11.5 - 15.5 % Final   Platelets 02/08/2022 361  150 - 400 K/uL Final   nRBC 02/08/2022 0.0  0.0 - 0.2 % Final   Performed at [place] [address], [place], [place] [address]   Sodium 02/08/2022 131 (L)  135 - 145 mmol/L Final   Potassium 02/08/2022 4.4  3.5 - 5.1 mmol/L Final   Chloride 02/08/2022 96 (L)  98 - 111 mmol/L Final   CO2 02/08/2022 25  22 - 32 mmol/L Final   Glucose, Bld 02/08/2022 126 (H)  70 - 99 mg/dL Final   Glucose reference range applies only to samples taken after fasting for at least 8 hours.   BUN 02/08/2022 11  6 - 20 mg/dL Final   Creatinine, Ser 02/08/2022 0.52  0.44 - 1.00 mg/dL Final   Calcium 02/08/2022 9.4  8.9 - 10.3 mg/dL Final   Total Protein 02/08/2022 7.5  6.5 - 8.1 g/dL Final   Albumin 02/08/2022 3.6  3.5 - 5.0 g/dL Final   AST 02/08/2022 22  15 - 41 U/L Final   ALT 02/08/2022 20  0 - 44 U/L Final   Alkaline Phosphatase 02/08/2022 67  38 - 126 U/L Final   Total Bilirubin 02/08/2022 0.1 (L)  0.3 - 1.2 mg/dL Final   GFR, Estimated 02/08/2022 >60  >60 mL/min Final   Comment: (NOTE) Calculated using the CKD-EPI Creatinine Equation (2021)    Anion gap 02/08/2022 10  5 - 15 Final   Performed at [place] [address], [place], [place] [address]   Troponin I (High Sensitivity) 02/08/2022 5  <18 ng/L Final   Comment: (NOTE) Elevated high sensitivity troponin I (hsTnI) values and significant  changes across serial measurements may suggest ACS but many other  chronic and acute conditions are known to elevate hsTnI results.  Refer to the "Links" section for chest pain algorithms and additional  guidance. Performed at [facility], [place] [address] [place], [place], [place] [address]    WBC 02/09/2022 7.3  4.0 - 10.5 K/uL Final   RBC 02/09/2022 4.70  3.87 - 5.11 MIL/uL Final   Hemoglobin 02/09/2022 11.4 (L)  12.0 - 15.0 g/dL Final   HCT  02/09/2022 36.6  36.0 - 46.0 % Final   MCV 02/09/2022 77.9  (L)  80.0 - 100.0 fL Final   MCH 02/09/2022 24.3 (L)  26.0 - 34.0 pg Final   MCHC 02/09/2022 31.1  30.0 - 36.0 g/dL Final   RDW 02/09/2022 16.6 (H)  11.5 - 15.5 % Final   Platelets 02/09/2022 403 (H)  150 - 400 K/uL Final   nRBC 02/09/2022 0.0  0.0 - 0.2 % Final   Performed at [facility], [place] [address], [place], [place] [address]   Glucose-Capillary 02/08/2022 117 (H)  70 - 99 mg/dL Final   Glucose reference range applies only to samples taken after fasting for at least 8 hours.   Sodium 02/09/2022 138  135 - 145 mmol/L Final   Potassium 02/09/2022 3.7  3.5 - 5.1 mmol/L Final   Chloride 02/09/2022 103  98 - 111 mmol/L Final   CO2 02/09/2022 24  22 - 32 mmol/L Final   Glucose, Bld 02/09/2022 134 (H)  70 - 99 mg/dL Final   Glucose reference range applies only to samples taken after fasting for at least 8 hours.   BUN 02/09/2022 9  6 - 20 mg/dL Final   Creatinine, Ser 02/09/2022 0.44  0.44 - 1.00 mg/dL Final   Calcium 02/09/2022 8.3 (L)  8.9 - 10.3 mg/dL Final   GFR, Estimated 02/09/2022 >60  >60 mL/min Final   Comment: (NOTE) Calculated using the CKD-EPI Creatinine Equation (2021)    Anion gap 02/09/2022 11  5 - 15 Final   Performed at [facility], [place] [address], [place], [place] [address]   Iron 02/09/2022 20 (L)  28 - 170 ug/dL Final   TIBC 02/09/2022 455 (H)  250 - 450 ug/dL Final   Saturation Ratios 02/09/2022 4 (L)  10.4 - 31.8 % Final   UIBC 02/09/2022 435  ug/dL Final   Performed at [facility], [place] [address], [place], [place] [address]   Ferritin 02/09/2022 2 (L)  11 - 307 ng/mL Final   Performed at [facility] [address], [place], [place] [address]   Weight 02/09/2022 2,846.58  oz Final   Height 02/09/2022 62  in Final   BP 02/09/2022 143/80  mmHg Final   WBC 02/09/2022 8.1  4.0 - 10.5 K/uL Final   RBC 02/09/2022 4.38  3.87 - 5.11 MIL/uL Final   Hemoglobin 02/09/2022 10.9 (L)  12.0 - 15.0 g/dL Final   HCT 02/09/2022 33.3 (L)  36.0 -  46.0 % Final   MCV 02/09/2022 76.0 (L)  80.0 - 100.0 fL Final   MCH 02/09/2022 24.9 (L)  26.0 - 34.0 pg Final   MCHC 02/09/2022 32.7  30.0 - 36.0 g/dL Final   RDW 02/09/2022 16.5 (H)  11.5 - 15.5 % Final   Platelets 02/09/2022 397  150 - 400 K/uL Final   nRBC 02/09/2022 0.0  0.0 - 0.2 % Final   Neutrophils Relative % 02/09/2022 66  % Final   Neutro Abs 02/09/2022 5.4  1.7 - 7.7 K/uL Final   Lymphocytes Relative 02/09/2022 24  % Final   Lymphs Abs 02/09/2022 1.9  0.7 - 4.0 K/uL Final   Monocytes Relative 02/09/2022 8  % Final   Monocytes Absolute 02/09/2022 0.6  0.1 - 1.0 K/uL Final   Eosinophils Relative 02/09/2022 1  % Final   Eosinophils Absolute 02/09/2022 0.1  0.0 - 0.5 K/uL Final   Basophils Relative 02/09/2022 1  %  Final   Basophils Absolute 02/09/2022 0.1  0.0 - 0.1 K/uL Final   Immature Granulocytes 02/09/2022 0  % Final   Abs Immature Granulocytes 02/09/2022 0.03  0.00 - 0.07 K/uL Final   Performed at [facility], [place] [address] [place], [place] [address]   Glucose-Capillary 02/09/2022 238 (H)  70 - 99 mg/dL Final   Glucose reference range applies only to samples taken after fasting for at least 8 hours.   Glucose-Capillary 02/09/2022 91  70 - 99 mg/dL Final   Glucose reference range applies only to samples taken after fasting for at least 8 hours.   CRP 02/09/2022 <0.5  <1.0 mg/dL Final   Performed at [facility] [address] [place], [place] [address]   Sed Rate 02/09/2022 15  0 - 22 mm/hr Final   Performed at [facility] [address], [place],  [address]   Glucose-Capillary 02/09/2022 137 (H)  70 - 99 mg/dL Final   Glucose reference range applies only to samples taken after fasting for at least 8 hours.  Admission on 01/24/2022, Discharged on 02/08/2022  Component Date Value Ref Range Status   Glucose-Capillary 01/24/2022 143 (H)  70 - 99 mg/dL Final   Glucose reference range applies only to samples taken after fasting for at least 8  hours.   Glucose-Capillary 01/24/2022 120 (H)  70 - 99 mg/dL Final   Glucose reference range applies only to samples taken after fasting for at least 8 hours.   Glucose-Capillary 01/25/2022 122 (H)  70 - 99 mg/dL Final   Glucose reference range applies only to samples taken after fasting for at least 8 hours.   Glucose-Capillary 01/25/2022 190 (H)  70 - 99 mg/dL Final   Glucose reference range applies only to samples taken after fasting for at least 8 hours.   Glucose-Capillary 01/25/2022 163 (H)  70 - 99 mg/dL Final   Glucose reference range applies only to samples taken after fasting for at least 8 hours.   WBC 01/26/2022 6.5  4.0 - 10.5 K/uL Final   RBC 01/26/2022 4.53  3.87 - 5.11 MIL/uL Final   Hemoglobin 01/26/2022 11.3 (L)  12.0 - 15.0 g/dL Final   HCT 01/26/2022 36.4  36.0 - 46.0 % Final   MCV 01/26/2022 80.4  80.0 - 100.0 fL Final   MCH 01/26/2022 24.9 (L)  26.0 - 34.0 pg Final   MCHC 01/26/2022 31.0  30.0 - 36.0 g/dL Final   RDW 01/26/2022 17.3 (H)  11.5 - 15.5 % Final   Platelets 01/26/2022 327  150 - 400 K/uL Final   nRBC 01/26/2022 0.0  0.0 - 0.2 % Final   Neutrophils Relative % 01/26/2022 60  % Final   Neutro Abs 01/26/2022 3.9  1.7 - 7.7 K/uL Final   Lymphocytes Relative 01/26/2022 31  % Final   Lymphs Abs 01/26/2022 2.0  0.7 - 4.0 K/uL Final   Monocytes Relative 01/26/2022 7  % Final   Monocytes Absolute 01/26/2022 0.5  0.1 - 1.0 K/uL Final   Eosinophils Relative 01/26/2022 1  % Final   Eosinophils Absolute 01/26/2022 0.1  0.0 - 0.5 K/uL Final   Basophils Relative 01/26/2022 1  % Final   Basophils Absolute 01/26/2022 0.1  0.0 - 0.1 K/uL Final   Immature Granulocytes 01/26/2022 0  % Final   Abs Immature Granulocytes 01/26/2022 0.02  0.00 - 0.07 K/uL Final   Performed at [facility], [place] [address],  [place], [place] [address]   Glucose-Capillary 01/26/2022 149 (H)  70 - 99 mg/dL Final   Glucose reference range applies only to samples taken after fasting for at  least 8 hours.   Glucose-Capillary 01/26/2022 130 (H)  70 - 99 mg/dL Final   Glucose reference range applies only to samples taken after fasting for at least 8 hours.   Glucose-Capillary 01/26/2022 151 (H)  70 - 99 mg/dL Final   Glucose reference range applies only to samples taken after fasting for at least 8 hours.   Glucose-Capillary 01/26/2022 170 (H)  70 - 99 mg/dL Final   Glucose reference range applies only to samples taken after fasting for at least 8 hours.   Glucose-Capillary 01/27/2022 129 (H)  70 - 99 mg/dL Final   Glucose reference range applies only to samples taken after fasting for at least 8 hours.   Glucose-Capillary 01/27/2022 101 (H)  70 - 99 mg/dL Final   Glucose reference range applies only to samples taken after fasting for at least 8 hours.   Glucose-Capillary 01/27/2022 220 (H)  70 - 99 mg/dL Final   Glucose reference range applies only to samples taken after fasting for at least 8 hours.   Glucose-Capillary 01/27/2022 154 (H)  70 - 99 mg/dL Final   Glucose reference range applies only to samples taken after fasting for at least 8 hours.   Glucose-Capillary 01/27/2022 163 (H)  70 - 99 mg/dL Final   Glucose reference range applies only to samples taken after fasting for at least 8 hours.   Glucose-Capillary 01/28/2022 127 (H)  70 - 99 mg/dL Final   Glucose reference range applies only to samples taken after fasting for at least 8 hours.   Glucose-Capillary 01/28/2022 110 (H)  70 - 99 mg/dL Final   Glucose reference range applies only to samples taken after fasting for at least 8 hours.   Glucose-Capillary 01/28/2022 167 (H)  70 - 99 mg/dL Final   Glucose reference range applies only to samples taken after fasting for at least 8 hours.   Glucose-Capillary 01/29/2022 145 (H)  70 - 99 mg/dL Final   Glucose reference range applies only to samples taken after fasting for at least 8 hours.   Glucose-Capillary 01/29/2022 203 (H)  70 - 99 mg/dL Final   Glucose reference  range applies only to samples taken after fasting for at least 8 hours.   Glucose-Capillary 01/29/2022 153 (H)  70 - 99 mg/dL Final   Glucose reference range applies only to samples taken after fasting for at least 8 hours.   Glucose-Capillary 01/29/2022 166 (H)  70 - 99 mg/dL Final   Glucose reference range applies only to samples taken after fasting for at least 8 hours.   Glucose-Capillary 01/30/2022 142 (H)  70 - 99 mg/dL Final   Glucose reference range applies only to samples taken after fasting for at least 8 hours.   Glucose-Capillary 01/30/2022 138 (H)  70 - 99 mg/dL Final   Glucose reference range applies only to samples taken after fasting for at least 8 hours.   Glucose-Capillary 01/30/2022 185 (H)  70 - 99 mg/dL Final   Glucose reference range applies only to samples taken after fasting for at least 8 hours.   Glucose-Capillary 01/30/2022 220 (H)  70 - 99 mg/dL Final   Glucose reference range applies only to samples taken after fasting for at least 8 hours.   Glucose-Capillary 01/31/2022 147 (H)  70 - 99 mg/dL Final   Glucose  reference range applies only to samples taken after fasting for at least 8 hours.   Glucose-Capillary 01/31/2022 131 (H)  70 - 99 mg/dL Final   Glucose reference range applies only to samples taken after fasting for at least 8 hours.   Glucose-Capillary 01/31/2022 169 (H)  70 - 99 mg/dL Final   Glucose reference range applies only to samples taken after fasting for at least 8 hours.   Glucose-Capillary 01/31/2022 144 (H)  70 - 99 mg/dL Final   Glucose reference range applies only to samples taken after fasting for at least 8 hours.   Comment 1 01/31/2022 RN AWARE   Final   Glucose-Capillary 02/01/2022 117 (H)  70 - 99 mg/dL Final   Glucose reference range applies only to samples taken after fasting for at least 8 hours.   Glucose-Capillary 02/01/2022 180 (H)  70 - 99 mg/dL Final   Glucose reference range applies only to samples taken after fasting for at  least 8 hours.   Glucose-Capillary 02/01/2022 209 (H)  70 - 99 mg/dL Final   Glucose reference range applies only to samples taken after fasting for at least 8 hours.   Glucose-Capillary 02/02/2022 131 (H)  70 - 99 mg/dL Final   Glucose reference range applies only to samples taken after fasting for at least 8 hours.   WBC 02/02/2022 7.5  4.0 - 10.5 K/uL Final   RBC 02/02/2022 4.52  3.87 - 5.11 MIL/uL Final   Hemoglobin 02/02/2022 11.3 (L)  12.0 - 15.0 g/dL Final   HCT 02/02/2022 35.0 (L)  36.0 - 46.0 % Final   MCV 02/02/2022 77.4 (L)  80.0 - 100.0 fL Final   MCH 02/02/2022 25.0 (L)  26.0 - 34.0 pg Final   MCHC 02/02/2022 32.3  30.0 - 36.0 g/dL Final   RDW 02/02/2022 16.7 (H)  11.5 - 15.5 % Final   Platelets 02/02/2022 345  150 - 400 K/uL Final   nRBC 02/02/2022 0.0  0.0 - 0.2 % Final   Neutrophils Relative % 02/02/2022 63  % Final   Neutro Abs 02/02/2022 4.7  1.7 - 7.7 K/uL Final   Lymphocytes Relative 02/02/2022 28  % Final   Lymphs Abs 02/02/2022 2.1  0.7 - 4.0 K/uL Final   Monocytes Relative 02/02/2022 7  % Final   Monocytes Absolute 02/02/2022 0.5  0.1 - 1.0 K/uL Final   Eosinophils Relative 02/02/2022 1  % Final   Eosinophils Absolute 02/02/2022 0.1  0.0 - 0.5 K/uL Final   Basophils Relative 02/02/2022 1  % Final   Basophils Absolute 02/02/2022 0.1  0.0 - 0.1 K/uL Final   Immature Granulocytes 02/02/2022 0  % Final   Abs Immature Granulocytes 02/02/2022 0.03  0.00 - 0.07 K/uL Final   Performed at [facility], [place] [address] [place], [place] [address]   Glucose-Capillary 02/02/2022 95  70 - 99 mg/dL Final   Glucose reference range applies only to samples taken after fasting for at least 8 hours.   Glucose-Capillary 02/02/2022 120 (H)  70 - 99 mg/dL Final   Glucose reference range applies only to samples taken after fasting for at least 8 hours.   Glucose-Capillary 02/02/2022 191 (H)  70 - 99 mg/dL Final   Glucose reference range applies only to samples taken after  fasting for at least 8 hours.   Glucose-Capillary 02/03/2022 148 (H)  70 - 99 mg/dL Final   Glucose reference range applies only to samples taken after fasting for at least  8 hours.   Glucose-Capillary 02/03/2022 122 (H)  70 - 99 mg/dL Final   Glucose reference range applies only to samples taken after fasting for at least 8 hours.   Glucose-Capillary 02/03/2022 233 (H)  70 - 99 mg/dL Final   Glucose reference range applies only to samples taken after fasting for at least 8 hours.   Glucose-Capillary 02/04/2022 126 (H)  70 - 99 mg/dL Final   Glucose reference range applies only to samples taken after fasting for at least 8 hours.   Glucose-Capillary 02/04/2022 117 (H)  70 - 99 mg/dL Final   Glucose reference range applies only to samples taken after fasting for at least 8 hours.   Glucose-Capillary 02/04/2022 135 (H)  70 - 99 mg/dL Final   Glucose reference range applies only to samples taken after fasting for at least 8 hours.   Glucose-Capillary 02/04/2022 197 (H)  70 - 99 mg/dL Final   Glucose reference range applies only to samples taken after fasting for at least 8 hours.   Glucose-Capillary 02/05/2022 170 (H)  70 - 99 mg/dL Final   Glucose reference range applies only to samples taken after fasting for at least 8 hours.   Glucose-Capillary 02/05/2022 107 (H)  70 - 99 mg/dL Final   Glucose reference range applies only to samples taken after fasting for at least 8 hours.   Glucose-Capillary 02/05/2022 184 (H)  70 - 99 mg/dL Final   Glucose reference range applies only to samples taken after fasting for at least 8 hours.   Glucose-Capillary 02/05/2022 256 (H)  70 - 99 mg/dL Final   Glucose reference range applies only to samples taken after fasting for at least 8 hours.   Glucose-Capillary 02/06/2022 144 (H)  70 - 99 mg/dL Final   Glucose reference range applies only to samples taken after fasting for at least 8 hours.   Glucose-Capillary 02/06/2022 190 (H)  70 - 99 mg/dL Final   Glucose  reference range applies only to samples taken after fasting for at least 8 hours.   Glucose-Capillary 02/06/2022 92  70 - 99 mg/dL Final   Glucose reference range applies only to samples taken after fasting for at least 8 hours.   Trichomonas 02/06/2022 Negative   Final   Bacterial Vaginitis-Urine 02/06/2022 Negative   Final   Molecular Comment 02/06/2022 For tests bacteria and/or candida, this specimen does not meet the   Final   Molecular Comment 02/06/2022 strict criteria set by the FDA. The result interpretation should be   Final   Molecular Comment 02/06/2022 considered in conjunction with the patient's clinical history.   Final   Comment 02/06/2022 Normal Reference Range Trichomonas - Negative   Final   Glucose-Capillary 02/06/2022 197 (H)  70 - 99 mg/dL Final   Glucose reference range applies only to samples taken after fasting for at least 8 hours.   Glucose-Capillary 02/07/2022 129 (H)  70 - 99 mg/dL Final   Glucose reference range applies only to samples taken after fasting for at least 8 hours.   Glucose-Capillary 02/07/2022 207 (H)  70 - 99 mg/dL Final   Glucose reference range applies only to samples taken after fasting for at least 8 hours.   Glucose-Capillary 02/07/2022 153 (H)  70 - 99 mg/dL Final   Glucose reference range applies only to samples taken after fasting for at least 8 hours.   Glucose-Capillary 02/08/2022 129 (H)  70 - 99 mg/dL Final   Glucose reference range applies only to samples  taken after fasting for at least 8 hours.   Glucose-Capillary 02/08/2022 107 (H)  70 - 99 mg/dL Final   Glucose reference range applies only to samples taken after fasting for at least 8 hours.  Admission on 01/23/2022, Discharged on 01/24/2022  Component Date Value Ref Range Status   Sodium 01/23/2022 140  135 - 145 mmol/L Final   Potassium 01/23/2022 4.4  3.5 - 5.1 mmol/L Final   Chloride 01/23/2022 101  98 - 111 mmol/L Final   CO2 01/23/2022 25  22 - 32 mmol/L Final   Glucose,  Bld 01/23/2022 198 (H)  70 - 99 mg/dL Final   Glucose reference range applies only to samples taken after fasting for at least 8 hours.   BUN 01/23/2022 13  6 - 20 mg/dL Final   Creatinine, Ser 01/23/2022 0.62  0.44 - 1.00 mg/dL Final   Calcium 01/23/2022 9.3  8.9 - 10.3 mg/dL Final   GFR, Estimated 01/23/2022 >60  >60 mL/min Final   Comment: (NOTE) Calculated using the CKD-EPI Creatinine Equation (2021)    Anion gap 01/23/2022 14  5 - 15 Final   Performed at [facility], [place] [address] [place], [place] [address]   WBC 01/23/2022 5.8  4.0 - 10.5 K/uL Final   RBC 01/23/2022 4.63  3.87 - 5.11 MIL/uL Final   Hemoglobin 01/23/2022 11.7 (L)  12.0 - 15.0 g/dL Final   HCT 01/23/2022 37.4  36.0 - 46.0 % Final   MCV 01/23/2022 80.8  80.0 - 100.0 fL Final   MCH 01/23/2022 25.3 (L)  26.0 - 34.0 pg Final   MCHC 01/23/2022 31.3  30.0 - 36.0 g/dL Final   RDW 01/23/2022 17.9 (H)  11.5 - 15.5 % Final   Platelets 01/23/2022 333  150 - 400 K/uL Final   nRBC 01/23/2022 0.0  0.0 - 0.2 % Final   Performed at [facility] [address] [place], [place] [address]   Troponin I (High Sensitivity) 01/23/2022 6  <18 ng/L Final   Comment: (NOTE) Elevated high sensitivity troponin I (hsTnI) values and significant  changes across serial measurements may suggest ACS but many other  chronic and acute conditions are known to elevate hsTnI results.  Refer to the "Links" section for chest pain algorithms and additional  guidance. Performed at [facility], [place] [address], [place], [place] [address]    Troponin I (High Sensitivity) 01/23/2022 5  <18 ng/L Final   Comment: (NOTE) Elevated high sensitivity troponin I (hsTnI) values and significant  changes across serial measurements may suggest ACS but many other  chronic and acute conditions are known to elevate hsTnI results.  Refer to the "Links" section for chest pain algorithms and additional  guidance. Performed at [facility], [place] [address], [place], [place] [address]    SARS Coronavirus 2 by RT PCR 01/23/2022 NEGATIVE  NEGATIVE Final   Comment: (NOTE) SARS-CoV-2 target nucleic acids are NOT DETECTED.  The SARS-CoV-2 RNA is generally detectable in upper and lower respiratory specimens during the acute phase of infection. The lowest concentration of SARS-CoV-2 viral copies this assay can detect is 250 copies / mL. A negative result does not preclude SARS-CoV-2 infection and should not be used as the sole basis for treatment or other patient management decisions.  A negative result may occur with improper specimen collection / handling, submission of specimen other than nasopharyngeal swab, presence of viral mutation(s) within the areas targeted by this assay,  and inadequate number of viral copies (<250 copies / mL). A negative result must be combined with clinical observations, patient history, and epidemiological information.  Fact Sheet for Patients:   https://www.patel.info/  Fact Sheet for Healthcare Providers: https://hall.com/  This test is not yet approved or                           cleared by the [place] FDA and has been authorized for detection and/or diagnosis of SARS-CoV-2 by FDA under an Emergency Use Authorization (EUA).  This EUA will remain in effect (meaning this test can be used) for the duration of the COVID-19 declaration under Section 564(b)(1) of the Act, 21 U.S.C. section 360bbb-3(b)(1), unless the authorization is terminated or revoked sooner.  Performed at [facility], [place] [address] [place] [address], [place], [place] [address]    B Natriuretic Peptide 01/23/2022 41.3  0.0 - 100.0 pg/mL Final   Performed at [place] [address] [place], [place] [address]   Group A Strep by PCR 01/23/2022 NOT DETECTED  NOT DETECTED Final   Performed at [facility], [place] [address], [place], [place] [address]   Color, Urine  01/23/2022 YELLOW  YELLOW Final   APPearance 01/23/2022 CLEAR  CLEAR Final   Specific Gravity, Urine 01/23/2022 1.018  1.005 - 1.030 Final   pH 01/23/2022 7.0  5.0 - 8.0 Final   Glucose, UA 01/23/2022 NEGATIVE  NEGATIVE mg/dL Final   Hgb urine dipstick 01/23/2022 NEGATIVE  NEGATIVE Final   Bilirubin Urine 01/23/2022 NEGATIVE  NEGATIVE Final   Ketones, ur 01/23/2022 NEGATIVE  NEGATIVE mg/dL Final   Protein, ur 01/23/2022 NEGATIVE  NEGATIVE mg/dL Final   Nitrite 01/23/2022 NEGATIVE  NEGATIVE Final   Leukocytes,Ua 01/23/2022 TRACE (A)  NEGATIVE Final   RBC / HPF 01/23/2022 0-5  0 - 5 RBC/hpf Final   WBC, UA 01/23/2022 0-5  0 - 5 WBC/hpf Final   Bacteria, UA 01/23/2022 NONE SEEN  NONE SEEN Final   Squamous Epithelial / LPF 01/23/2022 0-5  0 - 5 Final   Mucus 01/23/2022 PRESENT   Final   Performed at [facility], [place] [address], [place], [place] [address]   SARS Coronavirus 2 by RT PCR 01/23/2022 NEGATIVE  NEGATIVE Final   Comment: (NOTE) SARS-CoV-2 target nucleic acids are NOT DETECTED.  The SARS-CoV-2 RNA is generally detectable in upper respiratory specimens during the acute phase of infection. The lowest concentration of SARS-CoV-2 viral copies this assay can detect is 138 copies/mL. A negative result does not preclude SARS-Cov-2 infection and should not be used as the sole basis for treatment or other patient management decisions. A negative result may occur with  improper specimen collection/handling, submission of specimen other than nasopharyngeal swab, presence of viral mutation(s) within the areas targeted by this assay, and inadequate number of viral copies(<138 copies/mL). A negative result must be combined with clinical observations, patient history, and epidemiological information. The expected result is Negative.  Fact Sheet for Patients:  EntrepreneurPulse.com.au  Fact Sheet for Healthcare Providers:   IncredibleEmployment.be  This test is no                          t yet approved or cleared by the [place] FDA and  has been authorized for detection and/or diagnosis of SARS-CoV-2 by FDA under an Emergency Use Authorization (EUA). This EUA will remain  in effect (  meaning this test can be used) for the duration of the COVID-19 declaration under Section 564(b)(1) of the Act, 21 U.S.C.section 360bbb-3(b)(1), unless the authorization is terminated  or revoked sooner.       Influenza A by PCR 01/23/2022 NEGATIVE  NEGATIVE Final   Influenza B by PCR 01/23/2022 NEGATIVE  NEGATIVE Final   Comment: (NOTE) The Xpert Xpress SARS-CoV-2/FLU/RSV plus assay is intended as an aid in the diagnosis of influenza from Nasopharyngeal swab specimens and should not be used as a sole basis for treatment. Nasal washings and aspirates are unacceptable for Xpert Xpress SARS-CoV-2/FLU/RSV testing.  Fact Sheet for Patients: EntrepreneurPulse.com.au  Fact Sheet for Healthcare Providers: IncredibleEmployment.be  This test is not yet approved or cleared by the [place] FDA and has been authorized for detection and/or diagnosis of SARS-CoV-2 by FDA under an Emergency Use Authorization (EUA). This EUA will remain in effect (meaning this test can be used) for the duration of the COVID-19 declaration under Section 564(b)(1) of the Act, 21 U.S.C. section 360bbb-3(b)(1), unless the authorization is terminated or revoked.  Performed at [facility], [place] [address] [place], [place] [address]    WBC 01/24/2022 6.1  4.0 - 10.5 K/uL Final   RBC 01/24/2022 4.60  3.87 - 5.11 MIL/uL Final   Hemoglobin 01/24/2022 11.7 (L)  12.0 - 15.0 g/dL Final   HCT 01/24/2022 37.0  36.0 - 46.0 % Final   MCV 01/24/2022 80.4  80.0 - 100.0 fL Final   MCH 01/24/2022 25.4 (L)  26.0 - 34.0 pg Final   MCHC 01/24/2022 31.6  30.0 - 36.0 g/dL Final   RDW 01/24/2022  17.6 (H)  11.5 - 15.5 % Final   Platelets 01/24/2022 334  150 - 400 K/uL Final   nRBC 01/24/2022 0.0  0.0 - 0.2 % Final   Neutrophils Relative % 01/24/2022 53  % Final   Neutro Abs 01/24/2022 3.3  1.7 - 7.7 K/uL Final   Lymphocytes Relative 01/24/2022 33  % Final   Lymphs Abs 01/24/2022 2.0  0.7 - 4.0 K/uL Final   Monocytes Relative 01/24/2022 11  % Final   Monocytes Absolute 01/24/2022 0.7  0.1 - 1.0 K/uL Final   Eosinophils Relative 01/24/2022 2  % Final   Eosinophils Absolute 01/24/2022 0.1  0.0 - 0.5 K/uL Final   Basophils Relative 01/24/2022 1  % Final   Basophils Absolute 01/24/2022 0.1  0.0 - 0.1 K/uL Final   Immature Granulocytes 01/24/2022 0  % Final   Abs Immature Granulocytes 01/24/2022 0.02  0.00 - 0.07 K/uL Final   Performed at [facility], [place] [address], [place], [place] [address]   Glucose-Capillary 01/24/2022 110 (H)  70 - 99 mg/dL Final   Glucose reference range applies only to samples taken after fasting for at least 8 hours.  No results displayed because visit has over 200 results.    Admission on 11/22/2021, Discharged on 11/22/2021  Component Date Value Ref Range Status   Glucose-Capillary 11/22/2021 169 (H)  70 - 99 mg/dL Final   Glucose reference range applies only to samples taken after fasting for at least 8 hours.  No results displayed because visit has over 200 results.    Admission on 10/04/2021, Discharged on 10/22/2021  Component Date Value Ref Range Status   SARS Coronavirus 2 by RT PCR 10/04/2021 NEGATIVE  NEGATIVE Final   Comment: (NOTE) SARS-CoV-2 target nucleic acids are NOT DETECTED.  The SARS-CoV-2 RNA is generally detectable in upper respiratory  specimens during the acute phase of infection. The lowest concentration of SARS-CoV-2 viral copies this assay can detect is 138 copies/mL. A negative result does not preclude SARS-Cov-2 infection and should not be used as the sole basis for treatment or other patient management decisions. A  negative result may occur with  improper specimen collection/handling, submission of specimen other than nasopharyngeal swab, presence of viral mutation(s) within the areas targeted by this assay, and inadequate number of viral copies(<138 copies/mL). A negative result must be combined with clinical observations, patient history, and epidemiological information. The expected result is Negative.  Fact Sheet for Patients:  EntrepreneurPulse.com.au  Fact Sheet for Healthcare Providers:  IncredibleEmployment.be  This test is no                          t yet approved or cleared by the [place] FDA and  has been authorized for detection and/or diagnosis of SARS-CoV-2 by FDA under an Emergency Use Authorization (EUA). This EUA will remain  in effect (meaning this test can be used) for the duration of the COVID-19 declaration under Section 564(b)(1) of the Act, 21 U.S.C.section 360bbb-3(b)(1), unless the authorization is terminated  or revoked sooner.       Influenza A by PCR 10/04/2021 NEGATIVE  NEGATIVE Final   Influenza B by PCR 10/04/2021 NEGATIVE  NEGATIVE Final   Comment: (NOTE) The Xpert Xpress SARS-CoV-2/FLU/RSV plus assay is intended as an aid in the diagnosis of influenza from Nasopharyngeal swab specimens and should not be used as a sole basis for treatment. Nasal washings and aspirates are unacceptable for Xpert Xpress SARS-CoV-2/FLU/RSV testing.  Fact Sheet for Patients: EntrepreneurPulse.com.au  Fact Sheet for Healthcare Providers: IncredibleEmployment.be  This test is not yet approved or cleared by the [place] FDA and has been authorized for detection and/or diagnosis of SARS-CoV-2 by FDA under an Emergency Use Authorization (EUA). This EUA will remain in effect (meaning this test can be used) for the duration of the COVID-19 declaration under Section 564(b)(1) of the Act, 21  U.S.C. section 360bbb-3(b)(1), unless the authorization is terminated or revoked.  Performed at [facility], [place] [address] [place], [place] [address]    WBC 10/04/2021 8.4  4.0 - 10.5 K/uL Final   RBC 10/04/2021 4.42  3.87 - 5.11 MIL/uL Final   Hemoglobin 10/04/2021 11.6 (L)  12.0 - 15.0 g/dL Final   HCT 10/04/2021 35.7 (L)  36.0 - 46.0 % Final   MCV 10/04/2021 80.8  80.0 - 100.0 fL Final   MCH 10/04/2021 26.2  26.0 - 34.0 pg Final   MCHC 10/04/2021 32.5  30.0 - 36.0 g/dL Final   RDW 10/04/2021 16.0 (H)  11.5 - 15.5 % Final   Platelets 10/04/2021 372  150 - 400 K/uL Final   nRBC 10/04/2021 0.0  0.0 - 0.2 % Final   Neutrophils Relative % 10/04/2021 68  % Final   Neutro Abs 10/04/2021 5.7  1.7 - 7.7 K/uL Final   Lymphocytes Relative 10/04/2021 22  % Final   Lymphs Abs 10/04/2021 1.8  0.7 - 4.0 K/uL Final   Monocytes Relative 10/04/2021 8  % Final   Monocytes Absolute 10/04/2021 0.7  0.1 - 1.0 K/uL Final   Eosinophils Relative 10/04/2021 1  % Final   Eosinophils Absolute 10/04/2021 0.1  0.0 - 0.5 K/uL Final   Basophils Relative 10/04/2021 1  % Final   Basophils Absolute 10/04/2021 0.1  0.0 - 0.1 K/uL Final  Immature Granulocytes 10/04/2021 0  % Final   Abs Immature Granulocytes 10/04/2021 0.03  0.00 - 0.07 K/uL Final   Performed at [facility] [address], [place], [place] [address]   Sodium 10/04/2021 136  135 - 145 mmol/L Final   Potassium 10/04/2021 4.2  3.5 - 5.1 mmol/L Final   Chloride 10/04/2021 104  98 - 111 mmol/L Final   CO2 10/04/2021 25  22 - 32 mmol/L Final   Glucose, Bld 10/04/2021 100 (H)  70 - 99 mg/dL Final   Glucose reference range applies only to samples taken after fasting for at least 8 hours.   BUN 10/04/2021 9  6 - 20 mg/dL Final   Creatinine, Ser 10/04/2021 0.52  0.44 - 1.00 mg/dL Final   Calcium 10/04/2021 9.0  8.9 - 10.3 mg/dL Final   Total Protein 10/04/2021 7.0  6.5 - 8.1 g/dL Final   Albumin 10/04/2021 3.2 (L)  3.5 - 5.0 g/dL Final    AST 10/04/2021 13 (L)  15 - 41 U/L Final   ALT 10/04/2021 10  0 - 44 U/L Final   Alkaline Phosphatase 10/04/2021 61  38 - 126 U/L Final   Total Bilirubin 10/04/2021 0.3  0.3 - 1.2 mg/dL Final   GFR, Estimated 10/04/2021 >60  >60 mL/min Final   Comment: (NOTE) Calculated using the CKD-EPI Creatinine Equation (2021)    Anion gap 10/04/2021 7  5 - 15 Final   Performed at [facility] [address] [place], [place] [address]   Hgb A1c MFr Bld 10/04/2021 7.0 (H)  4.8 - 5.6 % Final   Comment: (NOTE) Pre diabetes:          5.7%-6.4%  Diabetes:              >6.4%  Glycemic control for   <7.0% adults with diabetes    Mean Plasma Glucose 10/04/2021 154.2  mg/dL Final   Performed at [facility], [place] [address], [place], [place] [address]   Cholesterol 10/04/2021 178  0 - 200 mg/dL Final   Triglycerides 10/04/2021 155 (H)  <150 mg/dL Final   HDL 10/04/2021 52  >40 mg/dL Final   Total CHOL/HDL Ratio 10/04/2021 3.4  RATIO Final   VLDL 10/04/2021 31  0 - 40 mg/dL Final   LDL Cholesterol 10/04/2021 95  0 - 99 mg/dL Final   Comment:        Total Cholesterol/HDL:CHD Risk Coronary Heart Disease Risk Table                     Men   Women  1/2 Average Risk   3.4   3.3  Average Risk       5.0   4.4  2 X Average Risk   9.6   7.1  3 X Average Risk  23.4   11.0        Use the calculated Patient Ratio above and the CHD Risk Table to determine the patient's CHD Risk.        ATP III CLASSIFICATION (LDL):  <100     mg/dL   Optimal  100-129  mg/dL   Near or Above                    Optimal  130-159  mg/dL   Borderline  160-189  mg/dL   High  >190     mg/dL   Very High Performed at [facility],  [address] [place], [place] [address]    POC Amphetamine UR 10/04/2021 None Detected  NONE DETECTED (Cut Off Level 1000 ng/mL) Final   POC Secobarbital (BAR) 10/04/2021 None Detected  NONE DETECTED (Cut Off Level 300 ng/mL) Final   POC Buprenorphine (BUP) 10/04/2021 None  Detected  NONE DETECTED (Cut Off Level 10 ng/mL) Final   POC Oxazepam (BZO) 10/04/2021 None Detected  NONE DETECTED (Cut Off Level 300 ng/mL) Final   POC Cocaine UR 10/04/2021 None Detected  NONE DETECTED (Cut Off Level 300 ng/mL) Final   POC Methamphetamine UR 10/04/2021 None Detected  NONE DETECTED (Cut Off Level 1000 ng/mL) Final   POC Morphine 10/04/2021 None Detected  NONE DETECTED (Cut Off Level 300 ng/mL) Final   POC Methadone UR 10/04/2021 None Detected  NONE DETECTED (Cut Off Level 300 ng/mL) Final   POC Oxycodone UR 10/04/2021 Positive (A)  NONE DETECTED (Cut Off Level 100 ng/mL) Final   POC Marijuana UR 10/04/2021 None Detected  NONE DETECTED (Cut Off Level 50 ng/mL) Final   SARSCOV2ONAVIRUS 2 AG 10/04/2021 NEGATIVE  NEGATIVE Final   Comment: (NOTE) SARS-CoV-2 antigen NOT DETECTED.   Negative results are presumptive.  Negative results do not preclude SARS-CoV-2 infection and should not be used as the sole basis for treatment or other patient management decisions, including infection  control decisions, particularly in the presence of clinical signs and  symptoms consistent with COVID-19, or in those who have been in contact with the virus.  Negative results must be combined with clinical observations, patient history, and epidemiological information. The expected result is Negative.  Fact Sheet for Patients: HandmadeRecipes.com.cy  Fact Sheet for Healthcare Providers: FuneralLife.at  This test is not yet approved or cleared by the [place] FDA and  has been authorized for detection and/or diagnosis of SARS-CoV-2 by FDA under an Emergency Use Authorization (EUA).  This EUA will remain in effect (meaning this test can be used) for the duration of  the COV                          ID-19 declaration under Section 564(b)(1) of the Act, 21 U.S.C. section 360bbb-3(b)(1), unless the authorization is terminated or revoked  sooner.     Valproic Acid Lvl 10/04/2021 51  50.0 - 100.0 ug/mL Final   Performed at [facility] [address], [place], [place] [address]   Valproic Acid Lvl 10/08/2021 57  50.0 - 100.0 ug/mL Final   Performed at [facility] [address] [place], [place] [address]   Glucose-Capillary 10/09/2021 123 (H)  70 - 99 mg/dL Final   Glucose reference range applies only to samples taken after fasting for at least 8 hours.  Admission on 09/09/2021, Discharged on 09/10/2021  Component Date Value Ref Range Status   Sodium 09/09/2021 134 (L)  135 - 145 mmol/L Final   Potassium 09/09/2021 4.3  3.5 - 5.1 mmol/L Final   Chloride 09/09/2021 99  98 - 111 mmol/L Final   CO2 09/09/2021 25  22 - 32 mmol/L Final   Glucose, Bld 09/09/2021 107 (H)  70 - 99 mg/dL Final   Glucose reference range applies only to samples taken after fasting for at least 8 hours.   BUN 09/09/2021 12  6 - 20 mg/dL Final   Creatinine, Ser 09/09/2021 0.74  0.44 - 1.00 mg/dL Final   Calcium 09/09/2021 9.0  8.9 - 10.3 mg/dL Final   Total Protein  09/09/2021 7.4  6.5 - 8.1 g/dL Final   Albumin 09/09/2021 3.1 (L)  3.5 - 5.0 g/dL Final   AST 09/09/2021 13 (L)  15 - 41 U/L Final   ALT 09/09/2021 13  0 - 44 U/L Final   Alkaline Phosphatase 09/09/2021 66  38 - 126 U/L Final   Total Bilirubin 09/09/2021 0.2 (L)  0.3 - 1.2 mg/dL Final   GFR, Estimated 09/09/2021 >60  >60 mL/min Final   Comment: (NOTE) Calculated using the CKD-EPI Creatinine Equation (2021)    Anion gap 09/09/2021 10  5 - 15 Final   Performed at [facility], [place] [address] [place], [place] [address]   WBC 09/09/2021 8.5  4.0 - 10.5 K/uL Final   RBC 09/09/2021 4.53  3.87 - 5.11 MIL/uL Final   Hemoglobin 09/09/2021 11.8 (L)  12.0 - 15.0 g/dL Final   HCT 09/09/2021 38.0  36.0 - 46.0 % Final   MCV 09/09/2021 83.9  80.0 - 100.0 fL Final   MCH 09/09/2021 26.0  26.0 - 34.0 pg Final   MCHC 09/09/2021 31.1  30.0 - 36.0 g/dL Final   RDW 09/09/2021 17.8  (H)  11.5 - 15.5 % Final   Platelets 09/09/2021 442 (H)  150 - 400 K/uL Final   nRBC 09/09/2021 0.0  0.0 - 0.2 % Final   Neutrophils Relative % 09/09/2021 70  % Final   Neutro Abs 09/09/2021 5.9  1.7 - 7.7 K/uL Final   Lymphocytes Relative 09/09/2021 20  % Final   Lymphs Abs 09/09/2021 1.7  0.7 - 4.0 K/uL Final   Monocytes Relative 09/09/2021 8  % Final   Monocytes Absolute 09/09/2021 0.7  0.1 - 1.0 K/uL Final   Eosinophils Relative 09/09/2021 1  % Final   Eosinophils Absolute 09/09/2021 0.1  0.0 - 0.5 K/uL Final   Basophils Relative 09/09/2021 1  % Final   Basophils Absolute 09/09/2021 0.1  0.0 - 0.1 K/uL Final   Immature Granulocytes 09/09/2021 0  % Final   Abs Immature Granulocytes 09/09/2021 0.03  0.00 - 0.07 K/uL Final   Performed at [facility], [place] [address], [place], [place] [address]   Ammonia 09/09/2021 37 (H)  9 - 35 umol/L Final   Comment: HEMOLYSIS AT THIS LEVEL MAY AFFECT RESULT Performed at [facility], [place] [address] [place], [place] [address]    Opiates 09/09/2021 NONE DETECTED  NONE DETECTED Final   Cocaine 09/09/2021 NONE DETECTED  NONE DETECTED Final   Benzodiazepines 09/09/2021 NONE DETECTED  NONE DETECTED Final   Amphetamines 09/09/2021 NONE DETECTED  NONE DETECTED Final   Tetrahydrocannabinol 09/09/2021 NONE DETECTED  NONE DETECTED Final   Barbiturates 09/09/2021 NONE DETECTED  NONE DETECTED Final   Comment: (NOTE) DRUG SCREEN FOR MEDICAL PURPOSES ONLY.  IF CONFIRMATION IS NEEDED FOR ANY PURPOSE, NOTIFY LAB WITHIN 5 DAYS.  LOWEST DETECTABLE LIMITS FOR URINE DRUG SCREEN Drug Class                     Cutoff (ng/mL) Amphetamine and metabolites    1000 Barbiturate and metabolites    200 Benzodiazepine                 810 Tricyclics and metabolites     300 Opiates and metabolites        300 Cocaine and metabolites        300 THC  50 Performed at [facility], [place] [address], [place], [place] [address]     Alcohol, Ethyl (B) 09/09/2021 <10  <10 mg/dL Final   Comment: (NOTE) Lowest detectable limit for serum alcohol is 10 mg/dL.  For medical purposes only. Performed at [facility], [place] [address], [place], [place] [address]    Lipase 09/09/2021 33  11 - 51 U/L Final   Performed at [facility] [address], [place], [place] [address]   Color, Urine 09/09/2021 COLORLESS (A)  YELLOW Final   APPearance 09/09/2021 CLEAR  CLEAR Final   Specific Gravity, Urine 09/09/2021 1.002 (L)  1.005 - 1.030 Final   pH 09/09/2021 6.0  5.0 - 8.0 Final   Glucose, UA 09/09/2021 NEGATIVE  NEGATIVE mg/dL Final   Hgb urine dipstick 09/09/2021 NEGATIVE  NEGATIVE Final   Bilirubin Urine 09/09/2021 NEGATIVE  NEGATIVE Final   Ketones, ur 09/09/2021 NEGATIVE  NEGATIVE mg/dL Final   Protein, ur 09/09/2021 NEGATIVE  NEGATIVE mg/dL Final   Nitrite 09/09/2021 NEGATIVE  NEGATIVE Final   Leukocytes,Ua 09/09/2021 NEGATIVE  NEGATIVE Final   Performed at [facility] [address], [place], [place] [address]   Specimen Description 09/09/2021 URINE, CLEAN CATCH   Final   Special Requests 09/09/2021 NONE   Final   Culture 09/09/2021  (A)   Final                   Value:<10,000 COLONIES/mL INSIGNIFICANT GROWTH Performed at [facility], [place] [address] [place], [place] [address]    Report Status 09/09/2021 09/10/2021 FINAL   Final   D-Dimer, Quant 09/09/2021 0.92 (H)  0.00 - 0.50 ug/mL-FEU Final   Comment: (NOTE) At the manufacturer cut-off value of 0.5 g/mL FEU, this assay has a negative predictive value of 95-100%.This assay is intended for use in conjunction with a clinical pretest probability (PTP) assessment model to exclude pulmonary embolism (PE) and deep venous thrombosis (DVT) in outpatients suspected of PE or DVT. Results should be correlated with clinical presentation. Performed at [facility], [place] [address], [place], [place] [address]    Troponin I (High Sensitivity)  09/09/2021 5  <18 ng/L Final   Comment: (NOTE) Elevated high sensitivity troponin I (hsTnI) values and significant  changes across serial measurements may suggest ACS but many other  chronic and acute conditions are known to elevate hsTnI results.  Refer to the "Links" section for chest pain algorithms and additional  guidance. Performed at [facility], [place] [address], [place], [place] [address]    BP 09/10/2021 135/83  mmHg Final   S' Lateral 09/10/2021 2.30  cm Final   Area-P 1/2 09/10/2021 5.58  cm2 Final    Blood Alcohol level:  Lab Results  Component Value Date   ETH <10 11/23/2021   ETH <10 97/04/6376    Metabolic Disorder Labs: Lab Results  Component Value Date   HGBA1C 6.7 (H) 12/28/2021   MPG 145.59 12/28/2021   MPG 154.2 10/04/2021   Lab Results  Component Value Date   PROLACTIN 3.8 (L) 11/23/2021   Lab Results  Component Value Date   CHOL 171 11/23/2021   TRIG 125 11/23/2021   HDL 65 11/23/2021   CHOLHDL 2.6 11/23/2021   VLDL 25 11/23/2021   LDLCALC 81 11/23/2021   LDLCALC 95 10/04/2021    Therapeutic Lab Levels: No results found for: "LITHIUM" Lab Results  Component Value Date   VALPROATE 33 (  L) 01/18/2022   VALPROATE 33 (L) 12/28/2021   No results found for: "CBMZ"  Physical Findings   PHQ2-9    Flowsheet Row ED from 11/23/2021 in [facility]  PHQ-2 Total Score 2  PHQ-9 Total Score 3      Flowsheet Row ED from 02/09/2022 in [facility] ED from 02/08/2022 in [facility] ED from 01/24/2022 in [facility] No Risk No Risk No Risk        Musculoskeletal  Strength & Muscle Tone: within normal limits Gait & Station: normal Patient leans: N/A  Psychiatric Specialty Exam  Presentation  General Appearance:  Appropriate for Environment  Eye Contact: Fair  Speech: Clear and  Coherent  Speech Volume: Normal  Handedness: Right   Mood and Affect  Mood: Irritable  Affect: Congruent   Thought Process  Thought Processes: Coherent  Descriptions of Associations:Intact  Orientation:Full (Time, Place and Person)  Thought Content:Logical  Diagnosis of Schizophrenia or Schizoaffective disorder in past: Yes  Duration of Psychotic Symptoms: No data recorded  Hallucinations:Hallucinations: None  Ideas of Reference:None  Suicidal Thoughts:Suicidal Thoughts: No  Homicidal Thoughts:Homicidal Thoughts: No   Sensorium  Memory: Immediate Fair; Remote Fair; Recent Fair  Judgment: Intact  Insight: Present   Executive Functions  Concentration: Fair  Attention Span: Fair  Recall: [employer] of Knowledge: Fair  Language: Fair   Psychomotor Activity  Psychomotor Activity: Psychomotor Activity: Normal   Assets  Assets: Communication Skills; Desire for Improvement; Financial Resources/Insurance   Sleep  Sleep: Sleep: Poor   No data recorded  Physical Exam  Physical Exam Cardiovascular:     Rate and Rhythm: Tachycardia present.  Pulmonary:     Effort: Pulmonary effort is normal.  Musculoskeletal:        General: Normal range of motion.  Neurological:     Mental Status: She is alert and oriented to person, place, and time.    Review of Systems  Constitutional: Negative.   HENT: Negative.    Eyes: Negative.   Respiratory: Negative.    Cardiovascular: Negative.   Gastrointestinal: Negative.   Genitourinary: Negative.   Musculoskeletal: Negative.   Neurological: Negative.   Endo/Heme/Allergies: Negative.    Blood pressure 131/86, pulse (!) 107, temperature 98.2 F (36.8 C), temperature source Oral, resp. rate 20, SpO2 99 %. There is no height or weight on file to calculate BMI.  Treatment Plan Summary: Patient has been psychiatrically cleared and does not meet criteria for inpatient psychiatric treatment.  Patient currently boarding at the [facility]. She is unable to return to group home unless she has been assigned a guardian. She is unable to care for herself and has been boarding on the continuous assessment unit while awaiting DSS caseworker to find appropriate placement. Social work/TOC working with DSS to find placement. Per Boris Lown, LCSW, Per Kevin Fenton, [facility] social work supervisor, their attorney determined the patient has the capacity to make placement decisions and they will not be seeking guardianship.    Per chart review, AID to Capacity Evaluation (ACE) completed by Dr. Louis Meckel, Dr. Dwyane Dee on 12/11/2021.  Please see media tab for full details.    [name], PhD completed: Wechsler Adult Intelligence Scale-4, Ms. Vidales achieved a full-scale IQ score of 73 and a percentile rank of 4 placing her in the borderline range of intellectual functioning (12/14/2021) Please see consult note from [name], PhD on 12/14/2021.  Vital  signs reviewed-mild tachycardia, otherwise WNL  Labs reviewed-CBG 153 this am.   Medication reviewed, no changes on 02/13/22  apixaban  5 mg Oral BID   ARIPiprazole  10 mg Oral Daily   cloZAPine  75 mg Oral QHS   clozapine  75 mg Oral Daily   colchicine  0.6 mg Oral QHS   diltiazem  240 mg Oral Daily   divalproex  500 mg Oral BID   ferrous sulfate  325 mg Oral Q breakfast   insulin aspart  0-5 Units Subcutaneous QHS   insulin aspart  0-9 Units Subcutaneous TID WC   latanoprost  1 drop Both Eyes QHS   metFORMIN  500 mg Oral BID WC   valbenazine  40 mg Oral q AM   Vitamin D (Ergocalciferol)  50,000 Units Oral Q7 days      Marissa Calamity, NP 02/13/2022 11:57 AM

## 2022-02-13 NOTE — Progress Notes (Signed)
Pt presents with pleasant mood, tangential speech. Teresa Murillo states she is '' eating better so I can watch my sugars, and make sure that I am taking care of my heart. ''  She reports feeling better since coming back from hospital for treatment and that she is '' trying to eat less sugar. ''  She denies any physical complaints, has been eating and drinking well. Coloring pages on the unit. Denies any SI HI AVH and remains hopeful for placement as pt continues to board on the unit awaiting new group home.  Pt is safe, able to make needs known.

## 2022-02-13 NOTE — ED Notes (Signed)
Pt A&O x 4, no distress noted, resting at present, Denies SI.  Monitoring for safety.

## 2022-02-13 NOTE — ED Notes (Signed)
Pt sleeping at present, no distress noted.  Monitoring for safety. 

## 2022-02-13 NOTE — ED Notes (Signed)
Pt is sleeping. No distress noted. Will continue to monitor safety. 

## 2022-02-14 DIAGNOSIS — F209 Schizophrenia, unspecified: Secondary | ICD-10-CM | POA: Diagnosis not present

## 2022-02-14 LAB — GLUCOSE, CAPILLARY
Glucose-Capillary: 156 mg/dL — ABNORMAL HIGH (ref 70–99)
Glucose-Capillary: 169 mg/dL — ABNORMAL HIGH (ref 70–99)
Glucose-Capillary: 156 mg/dL — ABNORMAL HIGH (ref 70–99)
Glucose-Capillary: 148 mg/dL — ABNORMAL HIGH (ref 70–99)

## 2022-02-14 MED ORDER — LOPERAMIDE HCL 2 MG PO CAPS: 2.0000 mg | ORAL_CAPSULE | ORAL | Status: DC | PRN

## 2022-02-14 MED ORDER — ONDANSETRON 4 MG PO TBDP: 4.0000 mg | ORAL_TABLET | Freq: Three times a day (TID) | ORAL | Status: DC | PRN

## 2022-02-14 NOTE — ED Notes (Signed)
Pt complained of arm pain, abdominal pain and gas, and reports to writer she has had four loose stools today. She states '' I want to stay here but I don't know if I can , I'm eating all this processed food and I'm not used ot eating like this, I think it's messed my stomach up. But I don't really know maybe it's cause I ate some of that fried chicken with the peppers... ''  Patient v/s obtained and HR noted to be 120. Pt encouraged to provide sample for nurse to view and aware not to flush.  Po fluids given and eating salad without issue.

## 2022-02-14 NOTE — ED Notes (Signed)
Pt A&O x 4, no distress noted, calm & cooperative.  Monitoring for safety. 

## 2022-02-14 NOTE — ED Provider Notes (Signed)
Behavioral Health Progress Note  Date and Time: 02/14/2022 2:56 PM Name: Teresa Murillo MRN:  [id number]  Per H&P, Teresa Murillo is a 61 y.o. female with a past psychiatric history of schizophrenia, aggressive behavior, and borderline intellectual disability presenting to [facility] on 11/23/21 voluntarily as a walk in via [facility] with complaints that she was locked out of the group home. Patient has been dismissed from group home due to multiple elopements. Pt had already been discharged from this facility but had been living there temporarily while DSS was looking for new placement. Pt is boarding at [facility].   Subjective: Patient seen and evaluated face to face by this provider, and chart reviewed. On evaluation, patient is sitting up on the edge of the bed. She is alert and oriented x 3. Her thought process is disorganized. Her speech is tangential. She denies SI/HI/AVH. There is no objective evidence that the patient is currently responding to internal or external stimuli. She denies depressive symptoms. She reports a good appetite. She reports good sleep last night. She initially denied physical complaints on exam but then voiced abdominal pain, diarrhea, and right arm pain to the nurse. On ev-evaluation, she denied acute abdominal pain or right extremity pain. She reported having 4 loose stools today due to the food she's eating on the unit. She denied N/V. She was able to perform range of motion of her upper extremities without difficulty. She denied headaches, chest pains or SOB. She was informed to report sudden changes in symptoms to the nursing staff. V/S assessed, B/P 144/79, HR 120, Resp 19, temp-98.3 and 100% RA. Will order Zofran and imodium for diarrhea and nausea.   Diagnosis:  Final diagnoses:  At risk for self care deficit  Schizophrenia, unspecified type ([place])  Noncompliance with medication regimen  Borderline intellectual functioning    Total Time spent with patient: 15  minutes  Past Psychiatric History: history of schizophrenia, aggressive behavior, and borderline intellectual disability   Past Medical History:  Past Medical History:  Diagnosis Date  . 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 On the Wechsler Adult Intelligence Scale-4, [name] achieved a full-scale IQ score of 73 and a percentile rank of 4 placing her in the borderline range of intellectual functioning.   . Chronic obstructive pulmonary disease (COPD) ([place])   . Glaucoma   . Hyperlipidemia   . Hypertension   . Iron deficiency   . Schizoaffective disorder ([place])   . Type 2 diabetes mellitus ([place])     Past Surgical History:  Procedure Laterality Date  . TUBAL LIGATION     Family History:  Family History  Problem Relation Age of Onset  . Breast cancer Maternal Grandmother    Family Psychiatric  History: No history reported.  Social History:  Social History   Substance and Sexual Activity  Alcohol Use Not Currently     Social History   Substance and Sexual Activity  Drug Use Not Currently    Social History   Socioeconomic History  . Marital status: Divorced    Spouse name: Not on file  . Number of children: Not on file  . Years of education: Not on file  . Highest education level: Not on file  Occupational History  . Not on file  Tobacco Use  . Smoking status: Former    Packs/day: 1.00    Types: Cigars, Cigarettes    Quit date: 11/09/2021    Years since quitting: 0.2  .  Smokeless tobacco: Current  Vaping Use  . Vaping Use: Never used  Substance and Sexual Activity  . Alcohol use: Not Currently  . Drug use: Not Currently  . Sexual activity: Not Currently    Birth control/protection: Surgical  Other Topics Concern  . Not on file  Social History Narrative  . Not on file   Social Determinants of Health   Financial Resource Strain: Not on file  Food Insecurity: Not on file   Transportation Needs: Not on file  Physical Activity: Not on file  Stress: Not on file  Social Connections: Not on file   SDOH:  SDOH Screenings   Depression (PHQ2-9): Low Risk  (11/23/2021)  Tobacco Use: High Risk (02/08/2022)   Current Medications:  Current Facility-Administered Medications  Medication Dose Route Frequency Provider Last Rate Last Admin  . acetaminophen (TYLENOL) tablet 650 mg  650 mg Oral Q6H PRN Onuoha, Chinwendu V, NP   650 mg at 02/13/22 2117  . alum & mag hydroxide-simeth (MAALOX/MYLANTA) 200-200-20 MG/5ML suspension 30 mL  30 mL Oral Q4H PRN Onuoha, Chinwendu V, NP   30 mL at 02/14/22 1145  . apixaban (ELIQUIS) tablet 5 mg  5 mg Oral BID Onuoha, Chinwendu V, NP   5 mg at 02/14/22 0801  . ARIPiprazole (ABILIFY) tablet 10 mg  10 mg Oral Daily Onuoha, Chinwendu V, NP   10 mg at 02/14/22 0801  . cloZAPine (CLOZARIL) tablet 75 mg  75 mg Oral Aliene Altes, MD   75 mg at 02/13/22 2118  . cloZAPine (CLOZARIL) tablet 75 mg  75 mg Oral Daily France Ravens, MD   75 mg at 02/14/22 0800  . colchicine tablet 0.6 mg  0.6 mg Oral QHS Onuoha, Chinwendu V, NP   0.6 mg at 02/13/22 2117  . diltiazem (CARDIZEM CD) 24 hr capsule 240 mg  240 mg Oral Daily Onuoha, Chinwendu V, NP   240 mg at 02/14/22 0800  . divalproex (DEPAKOTE ER) 24 hr tablet 500 mg  500 mg Oral BID Onuoha, Chinwendu V, NP   500 mg at 02/14/22 0801  . ferrous sulfate tablet 325 mg  325 mg Oral Q breakfast Onuoha, Chinwendu V, NP   325 mg at 02/14/22 0801  . haloperidol (HALDOL) tablet 5 mg  5 mg Oral TID PRN France Ravens, MD      . hydrOXYzine (ATARAX) tablet 25 mg  25 mg Oral TID PRN Onuoha, Chinwendu V, NP   25 mg at 02/13/22 2117  . insulin aspart (novoLOG) injection 0-5 Units  0-5 Units Subcutaneous QHS Onuoha, Chinwendu V, NP      . insulin aspart (novoLOG) injection 0-9 Units  0-9 Units Subcutaneous TID WC Onuoha, Chinwendu V, NP   2 Units at 02/14/22 1212  . latanoprost (XALATAN) 0.005 % ophthalmic solution 1 drop   1 drop Both Eyes QHS Ajibola, Ene A, NP   1 drop at 02/13/22 2119  . loperamide (IMODIUM) capsule 2 mg  2 mg Oral PRN Hakan Nudelman L, NP      . magnesium hydroxide (MILK OF MAGNESIA) suspension 30 mL  30 mL Oral Daily PRN Onuoha, Chinwendu V, NP      . menthol-cetylpyridinium (CEPACOL) lozenge 3 mg  1 lozenge Oral PRN Onuoha, Chinwendu V, NP   3 mg at 02/13/22 2119  . metFORMIN (GLUCOPHAGE) tablet 500 mg  500 mg Oral BID WC Onuoha, Chinwendu V, NP   500 mg at 02/14/22 0800  . ondansetron (ZOFRAN-ODT) disintegrating tablet 4 mg  4 mg Oral Q8H PRN Kesean Serviss L, NP      . sodium chloride (OCEAN) 0.65 % nasal spray 1 spray  1 spray Each Nare PRN France Ravens, MD      . traZODone (DESYREL) tablet 50 mg  50 mg Oral QHS PRN Onuoha, Chinwendu V, NP   50 mg at 02/13/22 2117  . valbenazine [facility]) capsule 40 mg  40 mg Oral q AM Onuoha, Chinwendu V, NP   40 mg at 02/14/22 0624  . Vitamin D (Ergocalciferol) (DRISDOL) 1.25 MG (50000 UNIT) capsule 50,000 Units  50,000 Units Oral Q7 days Onuoha, Chinwendu V, NP   50,000 Units at 02/11/22 0902   Current Outpatient Medications  Medication Sig Dispense Refill  . Accu-Chek Softclix Lancets lancets Use as directed up to four times daily 100 each 0  . apixaban (ELIQUIS) 5 MG TABS tablet Take 1 tablet (5 mg total) by mouth 2 (two) times daily. 60 tablet 0  . ARIPiprazole (ABILIFY) 10 MG tablet Take 1 tablet (10 mg total) by mouth daily. 30 tablet 0  . Blood Glucose Monitoring Suppl (ACCU-CHEK GUIDE) w/Device KIT Use as directed up to four times daily 1 kit 0  . budesonide-formoterol (SYMBICORT) 160-4.5 MCG/ACT inhaler Inhale 2 puffs into the lungs in the morning and at bedtime.    . Cholecalciferol (VITAMIN D3) 1.25 MG (50000 UT) CAPS Take 50,000 Units by mouth every Thursday.    . clozapine (CLOZARIL) 50 MG tablet Take 1 tablet (50 mg total) by mouth daily. 30 tablet 0  . colchicine 0.6 MG tablet Take 1 tablet (0.6 mg total) by mouth at bedtime. 30 tablet 0   . diltiazem (CARDIZEM CD) 240 MG 24 hr capsule Take 1 capsule (240 mg total) by mouth daily. (Patient not taking: Reported on 11/24/2021) 30 capsule 0  . divalproex (DEPAKOTE ER) 500 MG 24 hr tablet Take 1 tablet (500 mg total) by mouth 2 (two) times daily. 60 tablet 0  . glucose blood test strip Use as directed up to four times daily 50 each 0  . haloperidol (HALDOL) 10 MG tablet Take 1 tablet (10 mg total) by mouth 3 (three) times daily as needed for agitation (and psychotic symptoms).    . INGREZZA 40 MG capsule Take 1 capsule (40 mg total) by mouth in the morning. 30 capsule 0  . insulin aspart (NOVOLOG) 100 UNIT/ML FlexPen Before each meal 3 times a day, 140-199 - 2 units, 200-250 - 4 units, 251-299 - 6 units,  300-349 - 8 units,  350 or above 10 units. Insulin PEN if approved, provide syringes and needles if needed.Please switch to any approved short acting Insulin if needed. 15 mL 0  . insulin glargine (LANTUS) 100 UNIT/ML Solostar Pen Inject 12 Units into the skin daily. 15 mL 0  . Insulin Pen Needle 32G X 4 MM MISC Use 4 times a day with insulin, 1 month supply. 100 each 0  . latanoprost (XALATAN) 0.005 % ophthalmic solution Place 1 drop into both eyes at bedtime.    . metFORMIN (GLUCOPHAGE) 500 MG tablet Take 500 mg by mouth 2 (two) times daily with a meal.    . nicotine (NICODERM CQ - DOSED IN MG/24 HR) 7 mg/24hr patch Place 1 patch (7 mg total) onto the skin daily. 28 patch 0  . PROAIR HFA 108 (90 Base) MCG/ACT inhaler Inhale 2 puffs into the lungs every 6 (six) hours as needed for wheezing or shortness of breath.  Labs  Lab Results:  Admission on 02/09/2022  Component Date Value Ref Range Status  . Glucose-Capillary 02/09/2022 118 (H)  70 - 99 mg/dL Final   Glucose reference range applies only to samples taken after fasting for at least 8 hours.  . Glucose-Capillary 02/10/2022 148 (H)  70 - 99 mg/dL Final   Glucose reference range applies only to samples taken after fasting  for at least 8 hours.  . Glucose-Capillary 02/10/2022 174 (H)  70 - 99 mg/dL Final   Glucose reference range applies only to samples taken after fasting for at least 8 hours.  . Glucose-Capillary 02/10/2022 128 (H)  70 - 99 mg/dL Final   Glucose reference range applies only to samples taken after fasting for at least 8 hours.  . Glucose-Capillary 02/10/2022 182 (H)  70 - 99 mg/dL Final   Glucose reference range applies only to samples taken after fasting for at least 8 hours.  . Glucose-Capillary 02/11/2022 158 (H)  70 - 99 mg/dL Final   Glucose reference range applies only to samples taken after fasting for at least 8 hours.  . Glucose-Capillary 02/11/2022 159 (H)  70 - 99 mg/dL Final   Glucose reference range applies only to samples taken after fasting for at least 8 hours.  . Glucose-Capillary 02/11/2022 153 (H)  70 - 99 mg/dL Final   Glucose reference range applies only to samples taken after fasting for at least 8 hours.  . Glucose-Capillary 02/11/2022 159 (H)  70 - 99 mg/dL Final   Glucose reference range applies only to samples taken after fasting for at least 8 hours.  . Glucose-Capillary 02/11/2022 153 (H)  70 - 99 mg/dL Final   Glucose reference range applies only to samples taken after fasting for at least 8 hours.  . Glucose-Capillary 02/11/2022 109 (H)  70 - 99 mg/dL Final   Glucose reference range applies only to samples taken after fasting for at least 8 hours.  . Glucose-Capillary 02/11/2022 213 (H)  70 - 99 mg/dL Final   Glucose reference range applies only to samples taken after fasting for at least 8 hours.  . Glucose-Capillary 02/11/2022 229 (H)  70 - 99 mg/dL Final   Glucose reference range applies only to samples taken after fasting for at least 8 hours.  . Glucose-Capillary 02/12/2022 128 (H)  70 - 99 mg/dL Final   Glucose reference range applies only to samples taken after fasting for at least 8 hours.  . Glucose-Capillary 02/12/2022 149 (H)  70 - 99 mg/dL Final    Glucose reference range applies only to samples taken after fasting for at least 8 hours.  . Color, Urine 02/12/2022 YELLOW  YELLOW Final  . APPearance 02/12/2022 CLEAR  CLEAR Final  . Specific Gravity, Urine 02/12/2022 1.015  1.005 - 1.030 Final  . pH 02/12/2022 5.0  5.0 - 8.0 Final  . Glucose, UA 02/12/2022 NEGATIVE  NEGATIVE mg/dL Final  . Hgb urine dipstick 02/12/2022 NEGATIVE  NEGATIVE Final  . Bilirubin Urine 02/12/2022 NEGATIVE  NEGATIVE Final  . Ketones, ur 02/12/2022 NEGATIVE  NEGATIVE mg/dL Final  . Protein, ur 02/12/2022 NEGATIVE  NEGATIVE mg/dL Final  . Nitrite 02/12/2022 NEGATIVE  NEGATIVE Final  . Chalmers Guest 02/12/2022 NEGATIVE  NEGATIVE Final   Performed at [facility], [place] [address], [place], [place] [address]  . Specimen Description 02/12/2022 URINE, CLEAN CATCH   Final  . Special Requests 02/12/2022 NONE   Final  . Culture 02/12/2022  (A)   Final  Value:10,000 COLONIES/mL PROTEUS MIRABILIS SUSCEPTIBILITIES TO FOLLOW Performed at [facility], [place] [address], [place], [place] [address]   . Report Status 02/12/2022 PENDING   Incomplete  . Glucose-Capillary 02/12/2022 128 (H)  70 - 99 mg/dL Final   Glucose reference range applies only to samples taken after fasting for at least 8 hours.  . Glucose-Capillary 02/12/2022 196 (H)  70 - 99 mg/dL Final   Glucose reference range applies only to samples taken after fasting for at least 8 hours.  . Glucose-Capillary 02/13/2022 168 (H)  70 - 99 mg/dL Final   Glucose reference range applies only to samples taken after fasting for at least 8 hours.  . Glucose-Capillary 02/13/2022 153 (H)  70 - 99 mg/dL Final   Glucose reference range applies only to samples taken after fasting for at least 8 hours.  . Glucose-Capillary 02/13/2022 134 (H)  70 - 99 mg/dL Final   Glucose reference range applies only to samples taken after fasting for at least 8 hours.  . Glucose-Capillary 02/13/2022 178 (H)  70 - 99  mg/dL Final   Glucose reference range applies only to samples taken after fasting for at least 8 hours.  . Glucose-Capillary 02/14/2022 156 (H)  70 - 99 mg/dL Final   Glucose reference range applies only to samples taken after fasting for at least 8 hours.  . Glucose-Capillary 02/14/2022 169 (H)  70 - 99 mg/dL Final   Glucose reference range applies only to samples taken after fasting for at least 8 hours.  Admission on 02/08/2022, Discharged on 02/09/2022  Component Date Value Ref Range Status  . B Natriuretic Peptide 02/08/2022 40.2  0.0 - 100.0 pg/mL Final   Performed at [facility], [place] [address] [place], [place] [address]  . Troponin I (High Sensitivity) 02/08/2022 4  <18 ng/L Final   Comment: (NOTE) Elevated high sensitivity troponin I (hsTnI) values and significant  changes across serial measurements may suggest ACS but many other  chronic and acute conditions are known to elevate hsTnI results.  Refer to the "Links" section for chest pain algorithms and additional  guidance. Performed at [facility], [place] [address] [place], [place] [address]   . WBC 02/08/2022 8.5  4.0 - 10.5 K/uL Final  . RBC 02/08/2022 4.30  3.87 - 5.11 MIL/uL Final  . Hemoglobin 02/08/2022 10.7 (L)  12.0 - 15.0 g/dL Final  . HCT 02/08/2022 33.1 (L)  36.0 - 46.0 % Final  . MCV 02/08/2022 77.0 (L)  80.0 - 100.0 fL Final  . MCH 02/08/2022 24.9 (L)  26.0 - 34.0 pg Final  . MCHC 02/08/2022 32.3  30.0 - 36.0 g/dL Final  . RDW 02/08/2022 16.5 (H)  11.5 - 15.5 % Final  . Platelets 02/08/2022 361  150 - 400 K/uL Final  . nRBC 02/08/2022 0.0  0.0 - 0.2 % Final   Performed at [place] [address], [place],  [address]  . Sodium 02/08/2022 131 (L)  135 - 145 mmol/L Final  . Potassium 02/08/2022 4.4  3.5 - 5.1 mmol/L Final  . Chloride 02/08/2022 96 (L)  98 - 111 mmol/L Final  . CO2 02/08/2022 25  22 - 32 mmol/L Final  . Glucose, Bld 02/08/2022 126 (H)  70 - 99 mg/dL Final    Glucose reference range applies only to samples taken after fasting for at least 8 hours.  . BUN 02/08/2022 11  6 - 20 mg/dL Final  .  Creatinine, Ser 02/08/2022 0.52  0.44 - 1.00 mg/dL Final  . Calcium 02/08/2022 9.4  8.9 - 10.3 mg/dL Final  . Total Protein 02/08/2022 7.5  6.5 - 8.1 g/dL Final  . Albumin 02/08/2022 3.6  3.5 - 5.0 g/dL Final  . AST 02/08/2022 22  15 - 41 U/L Final  . ALT 02/08/2022 20  0 - 44 U/L Final  . Alkaline Phosphatase 02/08/2022 67  38 - 126 U/L Final  . Total Bilirubin 02/08/2022 0.1 (L)  0.3 - 1.2 mg/dL Final  . GFR, Estimated 02/08/2022 >60  >60 mL/min Final   Comment: (NOTE) Calculated using the CKD-EPI Creatinine Equation (2021)   . Anion gap 02/08/2022 10  5 - 15 Final   Performed at [facility], [place] [address], [place], [place] [address]  . Troponin I (High Sensitivity) 02/08/2022 5  <18 ng/L Final   Comment: (NOTE) Elevated high sensitivity troponin I (hsTnI) values and significant  changes across serial measurements may suggest ACS but many other  chronic and acute conditions are known to elevate hsTnI results.  Refer to the "Links" section for chest pain algorithms and additional  guidance. Performed at [facility], [place] [address], , [place] [address]   . WBC 02/09/2022 7.3  4.0 - 10.5 K/uL Final  . RBC 02/09/2022 4.70  3.87 - 5.11 MIL/uL Final  . Hemoglobin 02/09/2022 11.4 (L)  12.0 - 15.0 g/dL Final  . HCT 02/09/2022 36.6  36.0 - 46.0 % Final  . MCV 02/09/2022 77.9 (L)  80.0 - 100.0 fL Final  . MCH 02/09/2022 24.3 (L)  26.0 - 34.0 pg Final  . MCHC 02/09/2022 31.1  30.0 - 36.0 g/dL Final  . RDW 02/09/2022 16.6 (H)  11.5 - 15.5 % Final  . Platelets 02/09/2022 403 (H)  150 - 400 K/uL Final  . nRBC 02/09/2022 0.0  0.0 - 0.2 % Final   Performed at [facility] [address], [place], [place] [address]  . Glucose-Capillary 02/08/2022 117 (H)  70 - 99 mg/dL Final   Glucose reference range applies only to samples taken  after fasting for at least 8 hours.  . Sodium 02/09/2022 138  135 - 145 mmol/L Final  . Potassium 02/09/2022 3.7  3.5 - 5.1 mmol/L Final  . Chloride 02/09/2022 103  98 - 111 mmol/L Final  . CO2 02/09/2022 24  22 - 32 mmol/L Final  . Glucose, Bld 02/09/2022 134 (H)  70 - 99 mg/dL Final   Glucose reference range applies only to samples taken after fasting for at least 8 hours.  . BUN 02/09/2022 9  6 - 20 mg/dL Final  . Creatinine, Ser 02/09/2022 0.44  0.44 - 1.00 mg/dL Final  . Calcium 02/09/2022 8.3 (L)  8.9 - 10.3 mg/dL Final  . GFR, Estimated 02/09/2022 >60  >60 mL/min Final   Comment: (NOTE) Calculated using the CKD-EPI Creatinine Equation (2021)   . Anion gap 02/09/2022 11  5 - 15 Final   Performed at [facility], [place] [address], [place], [place] [address]  . Iron 02/09/2022 20 (L)  28 - 170 ug/dL Final  . TIBC 02/09/2022 455 (H)  250 - 450 ug/dL Final  . Saturation Ratios 02/09/2022 4 (L)  10.4 - 31.8 % Final  . UIBC 02/09/2022 435  ug/dL Final   Performed at [facility], [place] [address] [place], [place] [address]  . Ferritin 02/09/2022 2 (L)  11 -  307 ng/mL Final   Performed at [facility], [place] [address], [place], [place] [address]  . Weight 02/09/2022 2,846.58  oz Final  . Height 02/09/2022 62  in Final  . BP 02/09/2022 143/80  mmHg Final  . WBC 02/09/2022 8.1  4.0 - 10.5 K/uL Final  . RBC 02/09/2022 4.38  3.87 - 5.11 MIL/uL Final  . Hemoglobin 02/09/2022 10.9 (L)  12.0 - 15.0 g/dL Final  . HCT 02/09/2022 33.3 (L)  36.0 - 46.0 % Final  . MCV 02/09/2022 76.0 (L)  80.0 - 100.0 fL Final  . MCH 02/09/2022 24.9 (L)  26.0 - 34.0 pg Final  . MCHC 02/09/2022 32.7  30.0 - 36.0 g/dL Final  . RDW 02/09/2022 16.5 (H)  11.5 - 15.5 % Final  . Platelets 02/09/2022 397  150 - 400 K/uL Final  . nRBC 02/09/2022 0.0  0.0 - 0.2 % Final  . Neutrophils Relative % 02/09/2022 66  % Final  . Neutro Abs 02/09/2022 5.4  1.7 - 7.7 K/uL Final  . Lymphocytes Relative 02/09/2022 24   % Final  . Lymphs Abs 02/09/2022 1.9  0.7 - 4.0 K/uL Final  . Monocytes Relative 02/09/2022 8  % Final  . Monocytes Absolute 02/09/2022 0.6  0.1 - 1.0 K/uL Final  . Eosinophils Relative 02/09/2022 1  % Final  . Eosinophils Absolute 02/09/2022 0.1  0.0 - 0.5 K/uL Final  . Basophils Relative 02/09/2022 1  % Final  . Basophils Absolute 02/09/2022 0.1  0.0 - 0.1 K/uL Final  . Immature Granulocytes 02/09/2022 0  % Final  . Abs Immature Granulocytes 02/09/2022 0.03  0.00 - 0.07 K/uL Final   Performed at [facility], [place] [address], [place], [place] [address]  . Glucose-Capillary 02/09/2022 238 (H)  70 - 99 mg/dL Final   Glucose reference range applies only to samples taken after fasting for at least 8 hours.  . Glucose-Capillary 02/09/2022 91  70 - 99 mg/dL Final   Glucose reference range applies only to samples taken after fasting for at least 8 hours.  . CRP 02/09/2022 <0.5  <1.0 mg/dL Final   Performed at [facility], [place] [address], [place], [place] [address]  . Sed Rate 02/09/2022 15  0 - 22 mm/hr Final   Performed at [facility], [place] [address] [place], [place] [address]  . Glucose-Capillary 02/09/2022 137 (H)  70 - 99 mg/dL Final   Glucose reference range applies only to samples taken after fasting for at least 8 hours.  Admission on 01/24/2022, Discharged on 02/08/2022  Component Date Value Ref Range Status  . Glucose-Capillary 01/24/2022 143 (H)  70 - 99 mg/dL Final   Glucose reference range applies only to samples taken after fasting for at least 8 hours.  . Glucose-Capillary 01/24/2022 120 (H)  70 - 99 mg/dL Final   Glucose reference range applies only to samples taken after fasting for at least 8 hours.  . Glucose-Capillary 01/25/2022 122 (H)  70 - 99 mg/dL Final   Glucose reference range applies only to samples taken after fasting for at least 8 hours.  . Glucose-Capillary 01/25/2022 190 (H)  70 - 99 mg/dL Final   Glucose reference range applies only to  samples taken after fasting for at least 8 hours.  . Glucose-Capillary 01/25/2022 163 (H)  70 - 99 mg/dL Final   Glucose reference range applies only to samples taken after fasting for at least 8 hours.  [place]  WBC 01/26/2022 6.5  4.0 - 10.5 K/uL Final  . RBC 01/26/2022 4.53  3.87 - 5.11 MIL/uL Final  . Hemoglobin 01/26/2022 11.3 (L)  12.0 - 15.0 g/dL Final  . HCT 01/26/2022 36.4  36.0 - 46.0 % Final  . MCV 01/26/2022 80.4  80.0 - 100.0 fL Final  . MCH 01/26/2022 24.9 (L)  26.0 - 34.0 pg Final  . MCHC 01/26/2022 31.0  30.0 - 36.0 g/dL Final  . RDW 01/26/2022 17.3 (H)  11.5 - 15.5 % Final  . Platelets 01/26/2022 327  150 - 400 K/uL Final  . nRBC 01/26/2022 0.0  0.0 - 0.2 % Final  . Neutrophils Relative % 01/26/2022 60  % Final  . Neutro Abs 01/26/2022 3.9  1.7 - 7.7 K/uL Final  . Lymphocytes Relative 01/26/2022 31  % Final  . Lymphs Abs 01/26/2022 2.0  0.7 - 4.0 K/uL Final  . Monocytes Relative 01/26/2022 7  % Final  . Monocytes Absolute 01/26/2022 0.5  0.1 - 1.0 K/uL Final  . Eosinophils Relative 01/26/2022 1  % Final  . Eosinophils Absolute 01/26/2022 0.1  0.0 - 0.5 K/uL Final  . Basophils Relative 01/26/2022 1  % Final  . Basophils Absolute 01/26/2022 0.1  0.0 - 0.1 K/uL Final  . Immature Granulocytes 01/26/2022 0  % Final  . Abs Immature Granulocytes 01/26/2022 0.02  0.00 - 0.07 K/uL Final   Performed at [facility], [place] [address], [place], [place] [address]  . Glucose-Capillary 01/26/2022 149 (H)  70 - 99 mg/dL Final   Glucose reference range applies only to samples taken after fasting for at least 8 hours.  . Glucose-Capillary 01/26/2022 130 (H)  70 - 99 mg/dL Final   Glucose reference range applies only to samples taken after fasting for at least 8 hours.  . Glucose-Capillary 01/26/2022 151 (H)  70 - 99 mg/dL Final   Glucose reference range applies only to samples taken after fasting for at least 8 hours.  . Glucose-Capillary 01/26/2022 170 (H)  70 - 99 mg/dL Final   Glucose  reference range applies only to samples taken after fasting for at least 8 hours.  . Glucose-Capillary 01/27/2022 129 (H)  70 - 99 mg/dL Final   Glucose reference range applies only to samples taken after fasting for at least 8 hours.  . Glucose-Capillary 01/27/2022 101 (H)  70 - 99 mg/dL Final   Glucose reference range applies only to samples taken after fasting for at least 8 hours.  . Glucose-Capillary 01/27/2022 220 (H)  70 - 99 mg/dL Final   Glucose reference range applies only to samples taken after fasting for at least 8 hours.  . Glucose-Capillary 01/27/2022 154 (H)  70 - 99 mg/dL Final   Glucose reference range applies only to samples taken after fasting for at least 8 hours.  . Glucose-Capillary 01/27/2022 163 (H)  70 - 99 mg/dL Final   Glucose reference range applies only to samples taken after fasting for at least 8 hours.  . Glucose-Capillary 01/28/2022 127 (H)  70 - 99 mg/dL Final   Glucose reference range applies only to samples taken after fasting for at least 8 hours.  . Glucose-Capillary 01/28/2022 110 (H)  70 - 99 mg/dL Final   Glucose reference range applies only to samples taken after fasting for at least 8 hours.  . Glucose-Capillary 01/28/2022 167 (H)  70 - 99 mg/dL Final   Glucose reference range applies only to samples taken after fasting for at  least 8 hours.  . Glucose-Capillary 01/29/2022 145 (H)  70 - 99 mg/dL Final   Glucose reference range applies only to samples taken after fasting for at least 8 hours.  . Glucose-Capillary 01/29/2022 203 (H)  70 - 99 mg/dL Final   Glucose reference range applies only to samples taken after fasting for at least 8 hours.  . Glucose-Capillary 01/29/2022 153 (H)  70 - 99 mg/dL Final   Glucose reference range applies only to samples taken after fasting for at least 8 hours.  . Glucose-Capillary 01/29/2022 166 (H)  70 - 99 mg/dL Final   Glucose reference range applies only to samples taken after fasting for at least 8 hours.  .  Glucose-Capillary 01/30/2022 142 (H)  70 - 99 mg/dL Final   Glucose reference range applies only to samples taken after fasting for at least 8 hours.  . Glucose-Capillary 01/30/2022 138 (H)  70 - 99 mg/dL Final   Glucose reference range applies only to samples taken after fasting for at least 8 hours.  . Glucose-Capillary 01/30/2022 185 (H)  70 - 99 mg/dL Final   Glucose reference range applies only to samples taken after fasting for at least 8 hours.  . Glucose-Capillary 01/30/2022 220 (H)  70 - 99 mg/dL Final   Glucose reference range applies only to samples taken after fasting for at least 8 hours.  . Glucose-Capillary 01/31/2022 147 (H)  70 - 99 mg/dL Final   Glucose reference range applies only to samples taken after fasting for at least 8 hours.  . Glucose-Capillary 01/31/2022 131 (H)  70 - 99 mg/dL Final   Glucose reference range applies only to samples taken after fasting for at least 8 hours.  . Glucose-Capillary 01/31/2022 169 (H)  70 - 99 mg/dL Final   Glucose reference range applies only to samples taken after fasting for at least 8 hours.  . Glucose-Capillary 01/31/2022 144 (H)  70 - 99 mg/dL Final   Glucose reference range applies only to samples taken after fasting for at least 8 hours.  . Comment 1 01/31/2022 RN AWARE   Final  . Glucose-Capillary 02/01/2022 117 (H)  70 - 99 mg/dL Final   Glucose reference range applies only to samples taken after fasting for at least 8 hours.  . Glucose-Capillary 02/01/2022 180 (H)  70 - 99 mg/dL Final   Glucose reference range applies only to samples taken after fasting for at least 8 hours.  . Glucose-Capillary 02/01/2022 209 (H)  70 - 99 mg/dL Final   Glucose reference range applies only to samples taken after fasting for at least 8 hours.  . Glucose-Capillary 02/02/2022 131 (H)  70 - 99 mg/dL Final   Glucose reference range applies only to samples taken after fasting for at least 8 hours.  . WBC 02/02/2022 7.5  4.0 - 10.5 K/uL Final  .  RBC 02/02/2022 4.52  3.87 - 5.11 MIL/uL Final  . Hemoglobin 02/02/2022 11.3 (L)  12.0 - 15.0 g/dL Final  . HCT 02/02/2022 35.0 (L)  36.0 - 46.0 % Final  . MCV 02/02/2022 77.4 (L)  80.0 - 100.0 fL Final  . MCH 02/02/2022 25.0 (L)  26.0 - 34.0 pg Final  . MCHC 02/02/2022 32.3  30.0 - 36.0 g/dL Final  . RDW 02/02/2022 16.7 (H)  11.5 - 15.5 % Final  . Platelets 02/02/2022 345  150 - 400 K/uL Final  . nRBC 02/02/2022 0.0  0.0 - 0.2 % Final  . Neutrophils Relative % 02/02/2022  63  % Final  . Neutro Abs 02/02/2022 4.7  1.7 - 7.7 K/uL Final  . Lymphocytes Relative 02/02/2022 28  % Final  . Lymphs Abs 02/02/2022 2.1  0.7 - 4.0 K/uL Final  . Monocytes Relative 02/02/2022 7  % Final  . Monocytes Absolute 02/02/2022 0.5  0.1 - 1.0 K/uL Final  . Eosinophils Relative 02/02/2022 1  % Final  . Eosinophils Absolute 02/02/2022 0.1  0.0 - 0.5 K/uL Final  . Basophils Relative 02/02/2022 1  % Final  . Basophils Absolute 02/02/2022 0.1  0.0 - 0.1 K/uL Final  . Immature Granulocytes 02/02/2022 0  % Final  . Abs Immature Granulocytes 02/02/2022 0.03  0.00 - 0.07 K/uL Final   Performed at [facility] [address] [place], [place] [address]  . Glucose-Capillary 02/02/2022 95  70 - 99 mg/dL Final   Glucose reference range applies only to samples taken after fasting for at least 8 hours.  . Glucose-Capillary 02/02/2022 120 (H)  70 - 99 mg/dL Final   Glucose reference range applies only to samples taken after fasting for at least 8 hours.  . Glucose-Capillary 02/02/2022 191 (H)  70 - 99 mg/dL Final   Glucose reference range applies only to samples taken after fasting for at least 8 hours.  . Glucose-Capillary 02/03/2022 148 (H)  70 - 99 mg/dL Final   Glucose reference range applies only to samples taken after fasting for at least 8 hours.  . Glucose-Capillary 02/03/2022 122 (H)  70 - 99 mg/dL Final   Glucose reference range applies only to samples taken after fasting for at least 8 hours.  .  Glucose-Capillary 02/03/2022 233 (H)  70 - 99 mg/dL Final   Glucose reference range applies only to samples taken after fasting for at least 8 hours.  . Glucose-Capillary 02/04/2022 126 (H)  70 - 99 mg/dL Final   Glucose reference range applies only to samples taken after fasting for at least 8 hours.  . Glucose-Capillary 02/04/2022 117 (H)  70 - 99 mg/dL Final   Glucose reference range applies only to samples taken after fasting for at least 8 hours.  . Glucose-Capillary 02/04/2022 135 (H)  70 - 99 mg/dL Final   Glucose reference range applies only to samples taken after fasting for at least 8 hours.  . Glucose-Capillary 02/04/2022 197 (H)  70 - 99 mg/dL Final   Glucose reference range applies only to samples taken after fasting for at least 8 hours.  . Glucose-Capillary 02/05/2022 170 (H)  70 - 99 mg/dL Final   Glucose reference range applies only to samples taken after fasting for at least 8 hours.  . Glucose-Capillary 02/05/2022 107 (H)  70 - 99 mg/dL Final   Glucose reference range applies only to samples taken after fasting for at least 8 hours.  . Glucose-Capillary 02/05/2022 184 (H)  70 - 99 mg/dL Final   Glucose reference range applies only to samples taken after fasting for at least 8 hours.  . Glucose-Capillary 02/05/2022 256 (H)  70 - 99 mg/dL Final   Glucose reference range applies only to samples taken after fasting for at least 8 hours.  . Glucose-Capillary 02/06/2022 144 (H)  70 - 99 mg/dL Final   Glucose reference range applies only to samples taken after fasting for at least 8 hours.  . Glucose-Capillary 02/06/2022 190 (H)  70 - 99 mg/dL Final   Glucose reference range applies only to samples taken after fasting for at least 8  hours.  . Glucose-Capillary 02/06/2022 92  70 - 99 mg/dL Final   Glucose reference range applies only to samples taken after fasting for at least 8 hours.  . Trichomonas 02/06/2022 Negative   Final  . Bacterial Vaginitis-Urine 02/06/2022 Negative    Final  . Molecular Comment 02/06/2022 For tests bacteria and/or candida, this specimen does not meet the   Final  . Molecular Comment 02/06/2022 strict criteria set by the FDA. The result interpretation should be   Final  . Molecular Comment 02/06/2022 considered in conjunction with the patient's clinical history.   Final  . Comment 02/06/2022 Normal Reference Range Trichomonas - Negative   Final  . Glucose-Capillary 02/06/2022 197 (H)  70 - 99 mg/dL Final   Glucose reference range applies only to samples taken after fasting for at least 8 hours.  . Glucose-Capillary 02/07/2022 129 (H)  70 - 99 mg/dL Final   Glucose reference range applies only to samples taken after fasting for at least 8 hours.  . Glucose-Capillary 02/07/2022 207 (H)  70 - 99 mg/dL Final   Glucose reference range applies only to samples taken after fasting for at least 8 hours.  . Glucose-Capillary 02/07/2022 153 (H)  70 - 99 mg/dL Final   Glucose reference range applies only to samples taken after fasting for at least 8 hours.  . Glucose-Capillary 02/08/2022 129 (H)  70 - 99 mg/dL Final   Glucose reference range applies only to samples taken after fasting for at least 8 hours.  . Glucose-Capillary 02/08/2022 107 (H)  70 - 99 mg/dL Final   Glucose reference range applies only to samples taken after fasting for at least 8 hours.  Admission on 01/23/2022, Discharged on 01/24/2022  Component Date Value Ref Range Status  . Sodium 01/23/2022 140  135 - 145 mmol/L Final  . Potassium 01/23/2022 4.4  3.5 - 5.1 mmol/L Final  . Chloride 01/23/2022 101  98 - 111 mmol/L Final  . CO2 01/23/2022 25  22 - 32 mmol/L Final  . Glucose, Bld 01/23/2022 198 (H)  70 - 99 mg/dL Final   Glucose reference range applies only to samples taken after fasting for at least 8 hours.  . BUN 01/23/2022 13  6 - 20 mg/dL Final  . Creatinine, Ser 01/23/2022 0.62  0.44 - 1.00 mg/dL Final  . Calcium 01/23/2022 9.3  8.9 - 10.3 mg/dL Final  . GFR, Estimated  01/23/2022 >60  >60 mL/min Final   Comment: (NOTE) Calculated using the CKD-EPI Creatinine Equation (2021)   . Anion gap 01/23/2022 14  5 - 15 Final   Performed at [facility], [place] [address], [place], [place] [address]  . WBC 01/23/2022 5.8  4.0 - 10.5 K/uL Final  . RBC 01/23/2022 4.63  3.87 - 5.11 MIL/uL Final  . Hemoglobin 01/23/2022 11.7 (L)  12.0 - 15.0 g/dL Final  . HCT 01/23/2022 37.4  36.0 - 46.0 % Final  . MCV 01/23/2022 80.8  80.0 - 100.0 fL Final  . MCH 01/23/2022 25.3 (L)  26.0 - 34.0 pg Final  . MCHC 01/23/2022 31.3  30.0 - 36.0 g/dL Final  . RDW 01/23/2022 17.9 (H)  11.5 - 15.5 % Final  . Platelets 01/23/2022 333  150 - 400 K/uL Final  . nRBC 01/23/2022 0.0  0.0 - 0.2 % Final   Performed at [facility] [address] [place], [place] [address]  . Troponin I (High Sensitivity) 01/23/2022 6  <18 ng/L Final  Comment: (NOTE) Elevated high sensitivity troponin I (hsTnI) values and significant  changes across serial measurements may suggest ACS but many other  chronic and acute conditions are known to elevate hsTnI results.  Refer to the "Links" section for chest pain algorithms and additional  guidance. Performed at [facility], [place] [address], [place], [place] [address]   . Troponin I (High Sensitivity) 01/23/2022 5  <18 ng/L Final   Comment: (NOTE) Elevated high sensitivity troponin I (hsTnI) values and significant  changes across serial measurements may suggest ACS but many other  chronic and acute conditions are known to elevate hsTnI results.  Refer to the "Links" section for chest pain algorithms and additional  guidance. Performed at [facility], [place] [address], [place], [place] [address]   . SARS Coronavirus 2 by RT PCR 01/23/2022 NEGATIVE  NEGATIVE Final   Comment: (NOTE) SARS-CoV-2 target nucleic acids are NOT DETECTED.  The SARS-CoV-2 RNA is generally detectable in upper and lower respiratory specimens during the acute phase  of infection. The lowest concentration of SARS-CoV-2 viral copies this assay can detect is 250 copies / mL. A negative result does not preclude SARS-CoV-2 infection and should not be used as the sole basis for treatment or other patient management decisions.  A negative result may occur with improper specimen collection / handling, submission of specimen other than nasopharyngeal swab, presence of viral mutation(s) within the areas targeted by this assay, and inadequate number of viral copies (<250 copies / mL). A negative result must be combined with clinical observations, patient history, and epidemiological information.  Fact Sheet for Patients:   https://www.patel.info/  Fact Sheet for Healthcare Providers: https://hall.com/  This test is not yet approved or                           cleared by the [place] FDA and has been authorized for detection and/or diagnosis of SARS-CoV-2 by FDA under an Emergency Use Authorization (EUA).  This EUA will remain in effect (meaning this test can be used) for the duration of the COVID-19 declaration under Section 564(b)(1) of the Act, 21 U.S.C. section 360bbb-3(b)(1), unless the authorization is terminated or revoked sooner.  Performed at [facility], [place] [address] [place], [place] [address]   . B Natriuretic Peptide 01/23/2022 41.3  0.0 - 100.0 pg/mL Final   Performed at [facility], [place] [address], [place], [place] [address]  . Group A Strep by PCR 01/23/2022 NOT DETECTED  NOT DETECTED Final   Performed at [facility], [place] [address] [place], [place] [address]  . Color, Urine 01/23/2022 YELLOW  YELLOW Final  . APPearance 01/23/2022 CLEAR  CLEAR Final  . Specific Gravity, Urine 01/23/2022 1.018  1.005 - 1.030 Final  . pH 01/23/2022 7.0  5.0 - 8.0 Final  . Glucose, UA 01/23/2022 NEGATIVE  NEGATIVE mg/dL Final  . Hgb urine dipstick 01/23/2022 NEGATIVE  NEGATIVE Final   . Bilirubin Urine 01/23/2022 NEGATIVE  NEGATIVE Final  . Ketones, ur 01/23/2022 NEGATIVE  NEGATIVE mg/dL Final  . Protein, ur 01/23/2022 NEGATIVE  NEGATIVE mg/dL Final  . Nitrite 01/23/2022 NEGATIVE  NEGATIVE Final  . Chalmers Guest 01/23/2022 TRACE (A)  NEGATIVE Final  . RBC / HPF 01/23/2022 0-5  0 - 5 RBC/hpf Final  . WBC, UA 01/23/2022 0-5  0 - 5 WBC/hpf Final  . Bacteria, UA 01/23/2022 NONE SEEN  NONE SEEN Final  .  Squamous Epithelial / LPF 01/23/2022 0-5  0 - 5 Final  . Mucus 01/23/2022 PRESENT   Final   Performed at [facility], [place] [address], [place], [place] [address]  . SARS Coronavirus 2 by RT PCR 01/23/2022 NEGATIVE  NEGATIVE Final   Comment: (NOTE) SARS-CoV-2 target nucleic acids are NOT DETECTED.  The SARS-CoV-2 RNA is generally detectable in upper respiratory specimens during the acute phase of infection. The lowest concentration of SARS-CoV-2 viral copies this assay can detect is 138 copies/mL. A negative result does not preclude SARS-Cov-2 infection and should not be used as the sole basis for treatment or other patient management decisions. A negative result may occur with  improper specimen collection/handling, submission of specimen other than nasopharyngeal swab, presence of viral mutation(s) within the areas targeted by this assay, and inadequate number of viral copies(<138 copies/mL). A negative result must be combined with clinical observations, patient history, and epidemiological information. The expected result is Negative.  Fact Sheet for Patients:  EntrepreneurPulse.com.au  Fact Sheet for Healthcare Providers:  IncredibleEmployment.be  This test is no                          t yet approved or cleared by the [place] FDA and  has been authorized for detection and/or diagnosis of SARS-CoV-2 by FDA under an Emergency Use Authorization (EUA). This EUA will remain  in effect (meaning this test can be used)  for the duration of the COVID-19 declaration under Section 564(b)(1) of the Act, 21 U.S.C.section 360bbb-3(b)(1), unless the authorization is terminated  or revoked sooner.      . Influenza A by PCR 01/23/2022 NEGATIVE  NEGATIVE Final  . Influenza B by PCR 01/23/2022 NEGATIVE  NEGATIVE Final   Comment: (NOTE) The Xpert Xpress SARS-CoV-2/FLU/RSV plus assay is intended as an aid in the diagnosis of influenza from Nasopharyngeal swab specimens and should not be used as a sole basis for treatment. Nasal washings and aspirates are unacceptable for Xpert Xpress SARS-CoV-2/FLU/RSV testing.  Fact Sheet for Patients: EntrepreneurPulse.com.au  Fact Sheet for Healthcare Providers: IncredibleEmployment.be  This test is not yet approved or cleared by the [place] FDA and has been authorized for detection and/or diagnosis of SARS-CoV-2 by FDA under an Emergency Use Authorization (EUA). This EUA will remain in effect (meaning this test can be used) for the duration of the COVID-19 declaration under Section 564(b)(1) of the Act, 21 U.S.C. section 360bbb-3(b)(1), unless the authorization is terminated or revoked.  Performed at [facility], [place] [address] [place], [place] [address]   . WBC 01/24/2022 6.1  4.0 - 10.5 K/uL Final  . RBC 01/24/2022 4.60  3.87 - 5.11 MIL/uL Final  . Hemoglobin 01/24/2022 11.7 (L)  12.0 - 15.0 g/dL Final  . HCT 01/24/2022 37.0  36.0 - 46.0 % Final  . MCV 01/24/2022 80.4  80.0 - 100.0 fL Final  . MCH 01/24/2022 25.4 (L)  26.0 - 34.0 pg Final  . MCHC 01/24/2022 31.6  30.0 - 36.0 g/dL Final  . RDW 01/24/2022 17.6 (H)  11.5 - 15.5 % Final  . Platelets 01/24/2022 334  150 - 400 K/uL Final  . nRBC 01/24/2022 0.0  0.0 - 0.2 % Final  . Neutrophils Relative % 01/24/2022 53  % Final  . Neutro Abs 01/24/2022 3.3  1.7 - 7.7 K/uL Final  . Lymphocytes Relative 01/24/2022 33  % Final  . Lymphs Abs 01/24/2022 2.0  0.7 -  4.0 K/uL  Final  . Monocytes Relative 01/24/2022 11  % Final  . Monocytes Absolute 01/24/2022 0.7  0.1 - 1.0 K/uL Final  . Eosinophils Relative 01/24/2022 2  % Final  . Eosinophils Absolute 01/24/2022 0.1  0.0 - 0.5 K/uL Final  . Basophils Relative 01/24/2022 1  % Final  . Basophils Absolute 01/24/2022 0.1  0.0 - 0.1 K/uL Final  . Immature Granulocytes 01/24/2022 0  % Final  . Abs Immature Granulocytes 01/24/2022 0.02  0.00 - 0.07 K/uL Final   Performed at [facility], [place] [address], [place], [place] [address]  . Glucose-Capillary 01/24/2022 110 (H)  70 - 99 mg/dL Final   Glucose reference range applies only to samples taken after fasting for at least 8 hours.  No results displayed because visit has over 200 results.    Admission on 11/22/2021, Discharged on 11/22/2021  Component Date Value Ref Range Status  . Glucose-Capillary 11/22/2021 169 (H)  70 - 99 mg/dL Final   Glucose reference range applies only to samples taken after fasting for at least 8 hours.  No results displayed because visit has over 200 results.    Admission on 10/04/2021, Discharged on 10/22/2021  Component Date Value Ref Range Status  . SARS Coronavirus 2 by RT PCR 10/04/2021 NEGATIVE  NEGATIVE Final   Comment: (NOTE) SARS-CoV-2 target nucleic acids are NOT DETECTED.  The SARS-CoV-2 RNA is generally detectable in upper respiratory specimens during the acute phase of infection. The lowest concentration of SARS-CoV-2 viral copies this assay can detect is 138 copies/mL. A negative result does not preclude SARS-Cov-2 infection and should not be used as the sole basis for treatment or other patient management decisions. A negative result may occur with  improper specimen collection/handling, submission of specimen other than nasopharyngeal swab, presence of viral mutation(s) within the areas targeted by this assay, and inadequate number of viral copies(<138 copies/mL). A negative result must be combined with clinical  observations, patient history, and epidemiological information. The expected result is Negative.  Fact Sheet for Patients:  EntrepreneurPulse.com.au  Fact Sheet for Healthcare Providers:  IncredibleEmployment.be  This test is no                          t yet approved or cleared by the [place] FDA and  has been authorized for detection and/or diagnosis of SARS-CoV-2 by FDA under an Emergency Use Authorization (EUA). This EUA will remain  in effect (meaning this test can be used) for the duration of the COVID-19 declaration under Section 564(b)(1) of the Act, 21 U.S.C.section 360bbb-3(b)(1), unless the authorization is terminated  or revoked sooner.      . Influenza A by PCR 10/04/2021 NEGATIVE  NEGATIVE Final  . Influenza B by PCR 10/04/2021 NEGATIVE  NEGATIVE Final   Comment: (NOTE) The Xpert Xpress SARS-CoV-2/FLU/RSV plus assay is intended as an aid in the diagnosis of influenza from Nasopharyngeal swab specimens and should not be used as a sole basis for treatment. Nasal washings and aspirates are unacceptable for Xpert Xpress SARS-CoV-2/FLU/RSV testing.  Fact Sheet for Patients: EntrepreneurPulse.com.au  Fact Sheet for Healthcare Providers: IncredibleEmployment.be  This test is not yet approved or cleared by the [place] FDA and has been authorized for detection and/or diagnosis of SARS-CoV-2 by FDA under an Emergency Use Authorization (EUA). This EUA will remain in effect (meaning this test can be used) for the duration of the COVID-19 declaration under Section 564(b)(1) of  the Act, 21 U.S.C. section 360bbb-3(b)(1), unless the authorization is terminated or revoked.  Performed at [facility], [place] [address] [place], [place] [address]   . WBC 10/04/2021 8.4  4.0 - 10.5 K/uL Final  . RBC 10/04/2021 4.42  3.87 - 5.11 MIL/uL Final  . Hemoglobin 10/04/2021 11.6 (L)  12.0 - 15.0  g/dL Final  . HCT 10/04/2021 35.7 (L)  36.0 - 46.0 % Final  . MCV 10/04/2021 80.8  80.0 - 100.0 fL Final  . MCH 10/04/2021 26.2  26.0 - 34.0 pg Final  . MCHC 10/04/2021 32.5  30.0 - 36.0 g/dL Final  . RDW 10/04/2021 16.0 (H)  11.5 - 15.5 % Final  . Platelets 10/04/2021 372  150 - 400 K/uL Final  . nRBC 10/04/2021 0.0  0.0 - 0.2 % Final  . Neutrophils Relative % 10/04/2021 68  % Final  . Neutro Abs 10/04/2021 5.7  1.7 - 7.7 K/uL Final  . Lymphocytes Relative 10/04/2021 22  % Final  . Lymphs Abs 10/04/2021 1.8  0.7 - 4.0 K/uL Final  . Monocytes Relative 10/04/2021 8  % Final  . Monocytes Absolute 10/04/2021 0.7  0.1 - 1.0 K/uL Final  . Eosinophils Relative 10/04/2021 1  % Final  . Eosinophils Absolute 10/04/2021 0.1  0.0 - 0.5 K/uL Final  . Basophils Relative 10/04/2021 1  % Final  . Basophils Absolute 10/04/2021 0.1  0.0 - 0.1 K/uL Final  . Immature Granulocytes 10/04/2021 0  % Final  . Abs Immature Granulocytes 10/04/2021 0.03  0.00 - 0.07 K/uL Final   Performed at [facility], [place] [address], [place], [place] [address]  . Sodium 10/04/2021 136  135 - 145 mmol/L Final  . Potassium 10/04/2021 4.2  3.5 - 5.1 mmol/L Final  . Chloride 10/04/2021 104  98 - 111 mmol/L Final  . CO2 10/04/2021 25  22 - 32 mmol/L Final  . Glucose, Bld 10/04/2021 100 (H)  70 - 99 mg/dL Final   Glucose reference range applies only to samples taken after fasting for at least 8 hours.  . BUN 10/04/2021 9  6 - 20 mg/dL Final  . Creatinine, Ser 10/04/2021 0.52  0.44 - 1.00 mg/dL Final  . Calcium 10/04/2021 9.0  8.9 - 10.3 mg/dL Final  . Total Protein 10/04/2021 7.0  6.5 - 8.1 g/dL Final  . Albumin 10/04/2021 3.2 (L)  3.5 - 5.0 g/dL Final  . AST 10/04/2021 13 (L)  15 - 41 U/L Final  . ALT 10/04/2021 10  0 - 44 U/L Final  . Alkaline Phosphatase 10/04/2021 61  38 - 126 U/L Final  . Total Bilirubin 10/04/2021 0.3  0.3 - 1.2 mg/dL Final  . GFR, Estimated 10/04/2021 >60  >60 mL/min Final   Comment:  (NOTE) Calculated using the CKD-EPI Creatinine Equation (2021)   . Anion gap 10/04/2021 7  5 - 15 Final   Performed at [facility] [address], [place], [place] [address]  . Hgb A1c MFr Bld 10/04/2021 7.0 (H)  4.8 - 5.6 % Final   Comment: (NOTE) Pre diabetes:          5.7%-6.4%  Diabetes:              >6.4%  Glycemic control for   <7.0% adults with diabetes   . Mean Plasma Glucose 10/04/2021 154.2  mg/dL Final   Performed at [facility] [address], [place], [place] [address]  . Cholesterol 10/04/2021  178  0 - 200 mg/dL Final  . Triglycerides 10/04/2021 155 (H)  <150 mg/dL Final  . HDL 10/04/2021 52  >40 mg/dL Final  . Total CHOL/HDL Ratio 10/04/2021 3.4  RATIO Final  . VLDL 10/04/2021 31  0 - 40 mg/dL Final  . LDL Cholesterol 10/04/2021 95  0 - 99 mg/dL Final   Comment:        Total Cholesterol/HDL:CHD Risk Coronary Heart Disease Risk Table                     Men   Women  1/2 Average Risk   3.4   3.3  Average Risk       5.0   4.4  2 X Average Risk   9.6   7.1  3 X Average Risk  23.4   11.0        Use the calculated Patient Ratio above and the CHD Risk Table to determine the patient's CHD Risk.        ATP III CLASSIFICATION (LDL):  <100     mg/dL   Optimal  100-129  mg/dL   Near or Above                    Optimal  130-159  mg/dL   Borderline  160-189  mg/dL   High  >190     mg/dL   Very High Performed at [facility] [address], [place], [place] [address]   . POC Amphetamine UR 10/04/2021 None Detected  NONE DETECTED (Cut Off Level 1000 ng/mL) Final  . POC Secobarbital (BAR) 10/04/2021 None Detected  NONE DETECTED (Cut Off Level 300 ng/mL) Final  . POC Buprenorphine (BUP) 10/04/2021 None Detected  NONE DETECTED (Cut Off Level 10 ng/mL) Final  . POC Oxazepam (BZO) 10/04/2021 None Detected  NONE DETECTED (Cut Off Level 300 ng/mL) Final  . POC Cocaine UR 10/04/2021 None Detected  NONE DETECTED (Cut Off Level 300 ng/mL) Final  . POC  Methamphetamine UR 10/04/2021 None Detected  NONE DETECTED (Cut Off Level 1000 ng/mL) Final  . POC Morphine 10/04/2021 None Detected  NONE DETECTED (Cut Off Level 300 ng/mL) Final  . POC Methadone UR 10/04/2021 None Detected  NONE DETECTED (Cut Off Level 300 ng/mL) Final  . POC Oxycodone UR 10/04/2021 Positive (A)  NONE DETECTED (Cut Off Level 100 ng/mL) Final  . POC Marijuana UR 10/04/2021 None Detected  NONE DETECTED (Cut Off Level 50 ng/mL) Final  . SARSCOV2ONAVIRUS 2 AG 10/04/2021 NEGATIVE  NEGATIVE Final   Comment: (NOTE) SARS-CoV-2 antigen NOT DETECTED.   Negative results are presumptive.  Negative results do not preclude SARS-CoV-2 infection and should not be used as the sole basis for treatment or other patient management decisions, including infection  control decisions, particularly in the presence of clinical signs and  symptoms consistent with COVID-19, or in those who have been in contact with the virus.  Negative results must be combined with clinical observations, patient history, and epidemiological information. The expected result is Negative.  Fact Sheet for Patients: HandmadeRecipes.com.cy  Fact Sheet for Healthcare Providers: FuneralLife.at  This test is not yet approved or cleared by the [place] FDA and  has been authorized for detection and/or diagnosis of SARS-CoV-2 by FDA under an Emergency Use Authorization (EUA).  This EUA will remain in effect (meaning this test can be used) for the duration of  the COV  ID-19 declaration under Section 564(b)(1) of the Act, 21 U.S.C. section 360bbb-3(b)(1), unless the authorization is terminated or revoked sooner.    . Valproic Acid Lvl 10/04/2021 51  50.0 - 100.0 ug/mL Final   Performed at [facility] [address], [place], [place] [address]  . Valproic Acid Lvl 10/08/2021 57  50.0 - 100.0 ug/mL Final   Performed at [facility] [address], [place], [place] [address]  . Glucose-Capillary 10/09/2021 123 (H)  70 - 99 mg/dL Final   Glucose reference range applies only to samples taken after fasting for at least 8 hours.  Admission on 09/09/2021, Discharged on 09/10/2021  Component Date Value Ref Range Status  . Sodium 09/09/2021 134 (L)  135 - 145 mmol/L Final  . Potassium 09/09/2021 4.3  3.5 - 5.1 mmol/L Final  . Chloride 09/09/2021 99  98 - 111 mmol/L Final  . CO2 09/09/2021 25  22 - 32 mmol/L Final  . Glucose, Bld 09/09/2021 107 (H)  70 - 99 mg/dL Final   Glucose reference range applies only to samples taken after fasting for at least 8 hours.  . BUN 09/09/2021 12  6 - 20 mg/dL Final  . Creatinine, Ser 09/09/2021 0.74  0.44 - 1.00 mg/dL Final  . Calcium 09/09/2021 9.0  8.9 - 10.3 mg/dL Final  . Total Protein 09/09/2021 7.4  6.5 - 8.1 g/dL Final  . Albumin 09/09/2021 3.1 (L)  3.5 - 5.0 g/dL Final  . AST 09/09/2021 13 (L)  15 - 41 U/L Final  . ALT 09/09/2021 13  0 - 44 U/L Final  . Alkaline Phosphatase 09/09/2021 66  38 - 126 U/L Final  . Total Bilirubin 09/09/2021 0.2 (L)  0.3 - 1.2 mg/dL Final  . GFR, Estimated 09/09/2021 >60  >60 mL/min Final   Comment: (NOTE) Calculated using the CKD-EPI Creatinine Equation (2021)   . Anion gap 09/09/2021 10  5 - 15 Final   Performed at [facility] [address] [place], [place] [address]  . WBC 09/09/2021 8.5  4.0 - 10.5 K/uL Final  . RBC 09/09/2021 4.53  3.87 - 5.11 MIL/uL Final  . Hemoglobin 09/09/2021 11.8 (L)  12.0 - 15.0 g/dL Final  . HCT 09/09/2021 38.0  36.0 - 46.0 % Final  . MCV 09/09/2021 83.9  80.0 - 100.0 fL Final  . MCH 09/09/2021 26.0  26.0 - 34.0 pg Final  . MCHC 09/09/2021 31.1  30.0 - 36.0 g/dL Final  . RDW 09/09/2021 17.8 (H)  11.5 - 15.5 % Final  . Platelets 09/09/2021 442 (H)  150 - 400 K/uL Final  . nRBC 09/09/2021 0.0  0.0 - 0.2 % Final  . Neutrophils Relative % 09/09/2021 70  % Final  . Neutro Abs 09/09/2021 5.9  1.7 - 7.7 K/uL  Final  . Lymphocytes Relative 09/09/2021 20  % Final  . Lymphs Abs 09/09/2021 1.7  0.7 - 4.0 K/uL Final  . Monocytes Relative 09/09/2021 8  % Final  . Monocytes Absolute 09/09/2021 0.7  0.1 - 1.0 K/uL Final  . Eosinophils Relative 09/09/2021 1  % Final  . Eosinophils Absolute 09/09/2021 0.1  0.0 - 0.5 K/uL Final  . Basophils Relative 09/09/2021 1  % Final  . Basophils Absolute 09/09/2021 0.1  0.0 - 0.1 K/uL Final  . Immature Granulocytes 09/09/2021 0  % Final  . Abs Immature Granulocytes 09/09/2021 0.03  0.00 - 0.07 K/uL Final   Performed at [facility]  Lab, [address] [place], [place] [address]  . Ammonia 09/09/2021 37 (H)  9 - 35 umol/L Final   Comment: HEMOLYSIS AT THIS LEVEL MAY AFFECT RESULT Performed at [facility], [place] [address], [place], [place] [address]   . Opiates 09/09/2021 NONE DETECTED  NONE DETECTED Final  . Cocaine 09/09/2021 NONE DETECTED  NONE DETECTED Final  . Benzodiazepines 09/09/2021 NONE DETECTED  NONE DETECTED Final  . Amphetamines 09/09/2021 NONE DETECTED  NONE DETECTED Final  . Tetrahydrocannabinol 09/09/2021 NONE DETECTED  NONE DETECTED Final  . Barbiturates 09/09/2021 NONE DETECTED  NONE DETECTED Final   Comment: (NOTE) DRUG SCREEN FOR MEDICAL PURPOSES ONLY.  IF CONFIRMATION IS NEEDED FOR ANY PURPOSE, NOTIFY LAB WITHIN 5 DAYS.  LOWEST DETECTABLE LIMITS FOR URINE DRUG SCREEN Drug Class                     Cutoff (ng/mL) Amphetamine and metabolites    1000 Barbiturate and metabolites    200 Benzodiazepine                 694 Tricyclics and metabolites     300 Opiates and metabolites        300 Cocaine and metabolites        300 THC                            50 Performed at [facility], [place] [address] [place], [place] [address]   . Alcohol, Ethyl (B) 09/09/2021 <10  <10 mg/dL Final   Comment: (NOTE) Lowest detectable limit for serum alcohol is 10 mg/dL.  For medical purposes only. Performed at [facility],  [place] [address] [place], [place] [address]   . Lipase 09/09/2021 33  11 - 51 U/L Final   Performed at [facility] [address], [place], [place] [address]  . Color, Urine 09/09/2021 COLORLESS (A)  YELLOW Final  . APPearance 09/09/2021 CLEAR  CLEAR Final  . Specific Gravity, Urine 09/09/2021 1.002 (L)  1.005 - 1.030 Final  . pH 09/09/2021 6.0  5.0 - 8.0 Final  . Glucose, UA 09/09/2021 NEGATIVE  NEGATIVE mg/dL Final  . Hgb urine dipstick 09/09/2021 NEGATIVE  NEGATIVE Final  . Bilirubin Urine 09/09/2021 NEGATIVE  NEGATIVE Final  . Ketones, ur 09/09/2021 NEGATIVE  NEGATIVE mg/dL Final  . Protein, ur 09/09/2021 NEGATIVE  NEGATIVE mg/dL Final  . Nitrite 09/09/2021 NEGATIVE  NEGATIVE Final  . Chalmers Guest 09/09/2021 NEGATIVE  NEGATIVE Final   Performed at [facility], [place] [address] [place], [place] [address]  . Specimen Description 09/09/2021 URINE, CLEAN CATCH   Final  . Special Requests 09/09/2021 NONE   Final  . Culture 09/09/2021  (A)   Final                   Value:<10,000 COLONIES/mL INSIGNIFICANT GROWTH Performed at [facility], [place] [address] [place], [place] [address]   . Report Status 09/09/2021 09/10/2021 FINAL   Final  . D-Dimer, Quant 09/09/2021 0.92 (H)  0.00 - 0.50 ug/mL-FEU Final   Comment: (NOTE) At the manufacturer cut-off value of 0.5 g/mL FEU, this assay has a negative predictive value of 95-100%.This assay is intended for use in conjunction with a clinical pretest probability (PTP) assessment model to exclude pulmonary embolism (PE) and deep venous thrombosis (DVT) in outpatients suspected of PE or DVT. Results should be  correlated with clinical presentation. Performed at [facility], [place] [address] [place], [place] [address]   . Troponin I (High Sensitivity) 09/09/2021 5  <18 ng/L Final   Comment: (NOTE) Elevated high sensitivity troponin I (hsTnI) values and significant  changes across serial measurements may suggest ACS but many  other  chronic and acute conditions are known to elevate hsTnI results.  Refer to the "Links" section for chest pain algorithms and additional  guidance. Performed at [facility], [place] [address] [place], [place] [address]   . BP 09/10/2021 135/83  mmHg Final  . S' Lateral 09/10/2021 2.30  cm Final  . Area-P 1/2 09/10/2021 5.58  cm2 Final    Blood Alcohol level:  Lab Results  Component Value Date   ETH <10 11/23/2021   ETH <10 86/57/8469    Metabolic Disorder Labs: Lab Results  Component Value Date   HGBA1C 6.7 (H) 12/28/2021   MPG 145.59 12/28/2021   MPG 154.2 10/04/2021   Lab Results  Component Value Date   PROLACTIN 3.8 (L) 11/23/2021   Lab Results  Component Value Date   CHOL 171 11/23/2021   TRIG 125 11/23/2021   HDL 65 11/23/2021   CHOLHDL 2.6 11/23/2021   VLDL 25 11/23/2021   LDLCALC 81 11/23/2021   [place] 95 10/04/2021    Therapeutic Lab Levels: No results found for: "LITHIUM" Lab Results  Component Value Date   VALPROATE 33 (L) 01/18/2022   VALPROATE 33 (L) 12/28/2021   No results found for: "CBMZ"  Physical Findings   PHQ2-9    Flowsheet Row ED from 11/23/2021 in [facility]  PHQ-2 Total Score 2  PHQ-9 Total Score 3      Flowsheet Row ED from 02/09/2022 in [facility] ED from 02/08/2022 in [facility] ED from 01/24/2022 in [facility] No Risk No Risk No Risk        Musculoskeletal  Strength & Muscle Tone: within normal limits Gait & Station: normal Patient leans: N/A  Psychiatric Specialty Exam  Presentation  General Appearance:  Appropriate for Environment  Eye Contact: Fair  Speech: Clear and Coherent  Speech Volume: Normal  Handedness: Right   Mood and Affect  Mood: Euthymic  Affect: Congruent   Thought Process  Thought  Processes: Disorganized  Descriptions of Associations:Circumstantial  Orientation:Full (Time, Place and Person)  Thought Content:Logical  Diagnosis of Schizophrenia or Schizoaffective disorder in past: Yes  Duration of Psychotic Symptoms: No data recorded  Hallucinations:Hallucinations: None  Ideas of Reference:None  Suicidal Thoughts:Suicidal Thoughts: No  Homicidal Thoughts:Homicidal Thoughts: No   Sensorium  Memory: Immediate Fair; Recent Fair; Remote Fair  Judgment: Intact  Insight: Present   Executive Functions  Concentration: Fair  Attention Span: Fair  Recall: [employer] of Knowledge: Fair  Language: Fair   Psychomotor Activity  Psychomotor Activity: Psychomotor Activity: Normal   Assets  Assets: Communication Skills; Desire for Improvement   Sleep  Sleep: Sleep: Fair   No data recorded  Physical Exam  Physical Exam HENT:     Head: Normocephalic.     Nose: Nose normal.  Eyes:     Conjunctiva/sclera: Conjunctivae normal.  Cardiovascular:     Rate and Rhythm: Tachycardia present.  Pulmonary:     Effort: Pulmonary effort is normal.  Musculoskeletal:        General: Normal range of motion.     Cervical  back: Normal range of motion.  Neurological:     Mental Status: She is alert and oriented to person, place, and time.   Review of Systems  Constitutional: Negative.   HENT: Negative.    Eyes: Negative.   Respiratory: Negative.    Cardiovascular: Negative.  Negative for chest pain and palpitations.  Gastrointestinal:  Positive for abdominal pain and diarrhea. Negative for nausea and vomiting.  Genitourinary: Negative.   Musculoskeletal: Negative.   Neurological: Negative.  Negative for dizziness and headaches.  Endo/Heme/Allergies: Negative.    Blood pressure (!) 144/79, pulse 98, temperature 98.3 F (36.8 C), temperature source Oral, resp. rate 19, SpO2 100 %. There is no height or weight on file to calculate  BMI.  Treatment Plan Summary: Patient has been psychiatrically cleared and does not meet criteria for inpatient psychiatric treatment. Patient currently boarding at the [facility]. She is unable to return to group home unless she has been assigned a guardian. She is unable to care for herself and has been boarding on the continuous assessment unit while awaiting DSS caseworker to find appropriate placement. Social work/TOC working with DSS to find placement. Per Boris Lown, LCSW, Per Kevin Fenton, [facility] social work supervisor, their attorney determined the patient has the capacity to make placement decisions and they will not be seeking guardianship.    Per chart review, AID to Capacity Evaluation (ACE) completed by Dr. Louis Meckel, Dr. Dwyane Dee on 12/11/2021.  Please see media tab for full details.    [name], PhD completed: Wechsler Adult Intelligence Scale-4, Ms. [name] achieved a full-scale IQ score of 73 and a percentile rank of 4 placing her in the borderline range of intellectual functioning (12/14/2021) Please see consult note from [name], PhD on 12/14/2021.   Current medication regimen:  . apixaban  5 mg Oral BID  . ARIPiprazole  10 mg Oral Daily  . cloZAPine  75 mg Oral QHS  . clozapine  75 mg Oral Daily  . colchicine  0.6 mg Oral QHS  . diltiazem  240 mg Oral Daily  . divalproex  500 mg Oral BID  . ferrous sulfate  325 mg Oral Q breakfast  . insulin aspart  0-5 Units Subcutaneous QHS  . insulin aspart  0-9 Units Subcutaneous TID WC  . latanoprost  1 drop Both Eyes QHS  . metFORMIN  500 mg Oral BID WC  . valbenazine  40 mg Oral q AM  . Vitamin D (Ergocalciferol)  50,000 Units Oral Q7 days    Marissa Calamity, NP 02/14/2022 2:56 PM

## 2022-02-14 NOTE — ED Notes (Signed)
Pt sleeping at present, no distress noted.  Monitoring for safety. 

## 2022-02-15 DIAGNOSIS — F209 Schizophrenia, unspecified: Secondary | ICD-10-CM | POA: Diagnosis not present

## 2022-02-15 LAB — URINE CULTURE: Culture: 10000 — AB

## 2022-02-15 LAB — GLUCOSE, CAPILLARY
Glucose-Capillary: 159 mg/dL — ABNORMAL HIGH (ref 70–99)
Glucose-Capillary: 125 mg/dL — ABNORMAL HIGH (ref 70–99)
Glucose-Capillary: 142 mg/dL — ABNORMAL HIGH (ref 70–99)
Glucose-Capillary: 128 mg/dL — ABNORMAL HIGH (ref 70–99)

## 2022-02-15 NOTE — ED Notes (Signed)
Patient A&Ox4. Patient denies SI/HI and AVH. Patient denies any physical complaints when asked. No acute distress noted. Support and encouragement provided. Routine safety checks conducted according to facility protocol. Encouraged patient to notify staff if thoughts of harm toward self or others arise. Patient verbalize understanding and agreement. Will continue to monitor for safety.    

## 2022-02-15 NOTE — ED Notes (Signed)
Pt sleeping at present, no distress noted.  Monitoring for safety. 

## 2022-02-15 NOTE — BH Assessment (Signed)
OBS Care Management Note  Writer left a message with a group home worker at [facility] [phone]) for the owner Rhoderick Moody.    Writer was following up on the completion and submission of the PCP to the [facility] for authorization of payment to [employer] [facility].

## 2022-02-15 NOTE — Progress Notes (Addendum)
Received Teresa Murillo this AM awake on her chair bed, she ate breakfast and received her medications. She denied any concerns this AM.

## 2022-02-15 NOTE — Progress Notes (Signed)
Teresa Murillo remains present in the milieu and compliant with her medications throughout the day.

## 2022-02-15 NOTE — ED Provider Notes (Signed)
Behavioral Health Progress Note  Date and Time: 02/15/2022 9:38 PM Name: Teresa Murillo MRN:  [id number]  Teresa Murillo is a 61 y.o. female with a past psychiatric history of schizophrenia, aggressive behavior, and borderline intellectual disability presenting to [facility] on 11/23/21 voluntarily as a walk in via [facility] with complaints that she was locked out of the group home. Patient has been dismissed from group home due to multiple elopements. Pt had already been discharged from this facility but had been living there temporarily while DSS was looking for new placement. Pt is boarding at [facility].  Subjective:   On today's reassessment patient  is observed laying in her bed awake.  She is pleasant upon approach.  She is alert/oriented x 4 and cooperative.  She continues to deny any depression, anxiety, SI/HI/AVH.  She has a euthymic affect.  She discusses that she is looking forward to Christmas.  She is hoping to be discharged and at her new group home by that time.  She continues to be future oriented and in good spirits considering the amount of time that she has been boarding on the unit.  Social work and DSS continue to seek placement.   Diagnosis:  Final diagnoses:  At risk for self care deficit  Schizophrenia, unspecified type ([place])  Noncompliance with medication regimen  Borderline intellectual functioning    Total Time spent with patient: 30 minutes  Past Psychiatric History: See H&P Past Medical History:  Past Medical History:  Diagnosis Date   Borderline intellectual functioning 12/14/2021   On 12/14/2021: Appreciate assistance from psychology consult. On the Wechsler Adult Intelligence Scale-4, Teresa Murillo achieved a full-scale IQ score of 73 and a percentile rank of 4 placing her in the borderline range of intellectual functioning.    Chronic obstructive pulmonary disease (COPD) (HCC)    Glaucoma    Hyperlipidemia    Hypertension    Iron deficiency     Schizoaffective disorder (HCC)    Type 2 diabetes mellitus (HCC)     Past Surgical History:  Procedure Laterality Date   TUBAL LIGATION     Family History:  Family History  Problem Relation Age of Onset   Breast cancer Maternal Grandmother    Family Psychiatric  History: See H&P Social History:  Social History   Substance and Sexual Activity  Alcohol Use Not Currently     Social History   Substance and Sexual Activity  Drug Use Not Currently    Social History   Socioeconomic History   Marital status: Divorced    Spouse name: Not on file   Number of children: Not on file   Years of education: Not on file   Highest education level: Not on file  Occupational History   Not on file  Tobacco Use   Smoking status: Former    Packs/day: 1.00    Types: Cigars, Cigarettes    Quit date: 11/09/2021    Years since quitting: 0.2   Smokeless tobacco: Current  Vaping Use   Vaping Use: Never used  Substance and Sexual Activity   Alcohol use: Not Currently   Drug use: Not Currently   Sexual activity: Not Currently    Birth control/protection: Surgical  Other Topics Concern   Not on file  Social History Narrative   Not on file   Social Determinants of Health   Financial Resource Strain: Not on file  Food Insecurity: Not on file  Transportation Needs: Not on file  Physical Activity: Not on file  Stress: Not on file  Social Connections: Not on file   SDOH:  SDOH Screenings   Depression (PHQ2-9): Low Risk  (11/23/2021)  Tobacco Use: High Risk (02/08/2022)   Additional Social History:                         Sleep: Good  Appetite:  Good  Current Medications:  Current Facility-Administered Medications  Medication Dose Route Frequency Provider Last Rate Last Admin   acetaminophen (TYLENOL) tablet 650 mg  650 mg Oral Q6H PRN Onuoha, Chinwendu V, NP   650 mg at 02/15/22 2110   alum & mag hydroxide-simeth (MAALOX/MYLANTA) 200-200-20 MG/5ML suspension 30 mL   30 mL Oral Q4H PRN Onuoha, Chinwendu V, NP   30 mL at 02/14/22 1145   apixaban (ELIQUIS) tablet 5 mg  5 mg Oral BID Onuoha, Chinwendu V, NP   5 mg at 02/15/22 2112   ARIPiprazole (ABILIFY) tablet 10 mg  10 mg Oral Daily Onuoha, Chinwendu V, NP   10 mg at 02/15/22 1028   cloZAPine (CLOZARIL) tablet 75 mg  75 mg Oral Aliene Altes, MD   75 mg at 02/15/22 2111   cloZAPine (CLOZARIL) tablet 75 mg  75 mg Oral Daily France Ravens, MD   75 mg at 02/15/22 1028   colchicine tablet 0.6 mg  0.6 mg Oral QHS Onuoha, Chinwendu V, NP   0.6 mg at 02/15/22 2112   diltiazem (CARDIZEM CD) 24 hr capsule 240 mg  240 mg Oral Daily Onuoha, Chinwendu V, NP   240 mg at 02/15/22 1029   divalproex (DEPAKOTE ER) 24 hr tablet 500 mg  500 mg Oral BID Onuoha, Chinwendu V, NP   500 mg at 02/15/22 2111   ferrous sulfate tablet 325 mg  325 mg Oral Q breakfast Onuoha, Chinwendu V, NP   325 mg at 02/15/22 0811   haloperidol (HALDOL) tablet 5 mg  5 mg Oral TID PRN France Ravens, MD       hydrOXYzine (ATARAX) tablet 25 mg  25 mg Oral TID PRN Onuoha, Chinwendu V, NP   25 mg at 02/14/22 2135   insulin aspart (novoLOG) injection 0-5 Units  0-5 Units Subcutaneous QHS Onuoha, Chinwendu V, NP       insulin aspart (novoLOG) injection 0-9 Units  0-9 Units Subcutaneous TID WC Onuoha, Chinwendu V, NP   1 Units at 02/15/22 1714   latanoprost (XALATAN) 0.005 % ophthalmic solution 1 drop  1 drop Both Eyes QHS [place]ib[place], Ene A, NP   1 drop at 02/15/22 2114   loperamide (IMODIUM) capsule 2 mg  2 mg Oral PRN White, Patrice L, NP       magnesium hydroxide (MILK OF MAGNESIA) suspension 30 mL  30 mL Oral Daily PRN Onuoha, Chinwendu V, NP       menthol-cetylpyridinium (CEPACOL) lozenge 3 mg  1 lozenge Oral PRN Onuoha, Chinwendu V, NP   3 mg at 02/15/22 2116   metFORMIN (GLUCOPHAGE) tablet 500 mg  500 mg Oral BID WC Onuoha, Chinwendu V, NP   500 mg at 02/15/22 1714   ondansetron (ZOFRAN-ODT) disintegrating tablet 4 mg  4 mg Oral Q8H PRN White, Patrice L, NP        sodium chloride (OCEAN) 0.65 % nasal spray 1 spray  1 spray Each Nare PRN France Ravens, MD       traZODone (DESYREL) tablet 50 mg  50 mg Oral QHS PRN Onuoha, Chinwendu V, NP   50 mg  at 02/14/22 2135   valbenazine (INGREZZA) capsule 40 mg  40 mg Oral q AM Onuoha, Chinwendu V, NP   40 mg at 02/15/22 8416   Vitamin D (Ergocalciferol) (DRISDOL) 1.25 MG (50000 UNIT) capsule 50,000 Units  50,000 Units Oral Q7 days Onuoha, Chinwendu V, NP   50,000 Units at 02/11/22 0902   Current Outpatient Medications  Medication Sig Dispense Refill   Accu-Chek Softclix Lancets lancets Use as directed up to four times daily 100 each 0   apixaban (ELIQUIS) 5 MG TABS tablet Take 1 tablet (5 mg total) by mouth 2 (two) times daily. 60 tablet 0   ARIPiprazole (ABILIFY) 10 MG tablet Take 1 tablet (10 mg total) by mouth daily. 30 tablet 0   Blood Glucose Monitoring Suppl (ACCU-CHEK GUIDE) w/Device KIT Use as directed up to four times daily 1 kit 0   budesonide-formoterol (SYMBICORT) 160-4.5 MCG/ACT inhaler Inhale 2 puffs into the lungs in the morning and at bedtime.     Cholecalciferol (VITAMIN D3) 1.25 MG (50000 UT) CAPS Take 50,000 Units by mouth every Thursday.     clozapine (CLOZARIL) 50 MG tablet Take 1 tablet (50 mg total) by mouth daily. 30 tablet 0   colchicine 0.6 MG tablet Take 1 tablet (0.6 mg total) by mouth at bedtime. 30 tablet 0   diltiazem (CARDIZEM CD) 240 MG 24 hr capsule Take 1 capsule (240 mg total) by mouth daily. (Patient not taking: Reported on 11/24/2021) 30 capsule 0   divalproex (DEPAKOTE ER) 500 MG 24 hr tablet Take 1 tablet (500 mg total) by mouth 2 (two) times daily. 60 tablet 0   glucose blood test strip Use as directed up to four times daily 50 each 0   haloperidol (HALDOL) 10 MG tablet Take 1 tablet (10 mg total) by mouth 3 (three) times daily as needed for agitation (and psychotic symptoms).     INGREZZA 40 MG capsule Take 1 capsule (40 mg total) by mouth in the morning. 30 capsule 0   insulin  aspart (NOVOLOG) 100 UNIT/ML FlexPen Before each meal 3 times a day, 140-199 - 2 units, 200-250 - 4 units, 251-299 - 6 units,  300-349 - 8 units,  350 or above 10 units. Insulin PEN if approved, provide syringes and needles if needed.Please switch to any approved short acting Insulin if needed. 15 mL 0   insulin glargine (LANTUS) 100 UNIT/ML Solostar Pen Inject 12 Units into the skin daily. 15 mL 0   Insulin Pen Needle 32G X 4 MM MISC Use 4 times a day with insulin, 1 month supply. 100 each 0   latanoprost (XALATAN) 0.005 % ophthalmic solution Place 1 drop into both eyes at bedtime.     metFORMIN (GLUCOPHAGE) 500 MG tablet Take 500 mg by mouth 2 (two) times daily with a meal.     nicotine (NICODERM CQ - DOSED IN MG/24 HR) 7 mg/24hr patch Place 1 patch (7 mg total) onto the skin daily. 28 patch 0   PROAIR HFA 108 (90 Base) MCG/ACT inhaler Inhale 2 puffs into the lungs every 6 (six) hours as needed for wheezing or shortness of breath.      Labs  Lab Results:  Admission on 02/09/2022  Component Date Value Ref Range Status   Glucose-Capillary 02/09/2022 118 (H)  70 - 99 mg/dL Final   Glucose reference range applies only to samples taken after fasting for at least 8 hours.   Glucose-Capillary 02/10/2022 148 (H)  70 - 99 mg/dL Final  Glucose reference range applies only to samples taken after fasting for at least 8 hours.   Glucose-Capillary 02/10/2022 174 (H)  70 - 99 mg/dL Final   Glucose reference range applies only to samples taken after fasting for at least 8 hours.   Glucose-Capillary 02/10/2022 128 (H)  70 - 99 mg/dL Final   Glucose reference range applies only to samples taken after fasting for at least 8 hours.   Glucose-Capillary 02/10/2022 182 (H)  70 - 99 mg/dL Final   Glucose reference range applies only to samples taken after fasting for at least 8 hours.   Glucose-Capillary 02/11/2022 158 (H)  70 - 99 mg/dL Final   Glucose reference range applies only to samples taken after fasting  for at least 8 hours.   Glucose-Capillary 02/11/2022 159 (H)  70 - 99 mg/dL Final   Glucose reference range applies only to samples taken after fasting for at least 8 hours.   Glucose-Capillary 02/11/2022 153 (H)  70 - 99 mg/dL Final   Glucose reference range applies only to samples taken after fasting for at least 8 hours.   Glucose-Capillary 02/11/2022 159 (H)  70 - 99 mg/dL Final   Glucose reference range applies only to samples taken after fasting for at least 8 hours.   Glucose-Capillary 02/11/2022 153 (H)  70 - 99 mg/dL Final   Glucose reference range applies only to samples taken after fasting for at least 8 hours.   Glucose-Capillary 02/11/2022 109 (H)  70 - 99 mg/dL Final   Glucose reference range applies only to samples taken after fasting for at least 8 hours.   Glucose-Capillary 02/11/2022 213 (H)  70 - 99 mg/dL Final   Glucose reference range applies only to samples taken after fasting for at least 8 hours.   Glucose-Capillary 02/11/2022 229 (H)  70 - 99 mg/dL Final   Glucose reference range applies only to samples taken after fasting for at least 8 hours.   Glucose-Capillary 02/12/2022 128 (H)  70 - 99 mg/dL Final   Glucose reference range applies only to samples taken after fasting for at least 8 hours.   Glucose-Capillary 02/12/2022 149 (H)  70 - 99 mg/dL Final   Glucose reference range applies only to samples taken after fasting for at least 8 hours.   Color, Urine 02/12/2022 YELLOW  YELLOW Final   APPearance 02/12/2022 CLEAR  CLEAR Final   Specific Gravity, Urine 02/12/2022 1.015  1.005 - 1.030 Final   pH 02/12/2022 5.0  5.0 - 8.0 Final   Glucose, UA 02/12/2022 NEGATIVE  NEGATIVE mg/dL Final   Hgb urine dipstick 02/12/2022 NEGATIVE  NEGATIVE Final   Bilirubin Urine 02/12/2022 NEGATIVE  NEGATIVE Final   Ketones, ur 02/12/2022 NEGATIVE  NEGATIVE mg/dL Final   Protein, ur 02/12/2022 NEGATIVE  NEGATIVE mg/dL Final   Nitrite 02/12/2022 NEGATIVE  NEGATIVE Final    Leukocytes,Ua 02/12/2022 NEGATIVE  NEGATIVE Final   Performed at [facility], [place] [address] [place], [place] [address]   Specimen Description 02/12/2022 URINE, CLEAN CATCH   Final   Special Requests 02/12/2022    Final                   Value:NONE Performed at [facility], [place] [address], [place], [place] [address]    Culture 02/12/2022 10,000 COLONIES/mL PROTEUS MIRABILIS (A)   Final   Report Status 02/12/2022 02/15/2022 FINAL   Final   Organism ID, Bacteria 02/12/2022 PROTEUS MIRABILIS (A)   Final  Glucose-Capillary 02/12/2022 128 (H)  70 - 99 mg/dL Final   Glucose reference range applies only to samples taken after fasting for at least 8 hours.   Glucose-Capillary 02/12/2022 196 (H)  70 - 99 mg/dL Final   Glucose reference range applies only to samples taken after fasting for at least 8 hours.   Glucose-Capillary 02/13/2022 168 (H)  70 - 99 mg/dL Final   Glucose reference range applies only to samples taken after fasting for at least 8 hours.   Glucose-Capillary 02/13/2022 153 (H)  70 - 99 mg/dL Final   Glucose reference range applies only to samples taken after fasting for at least 8 hours.   Glucose-Capillary 02/13/2022 134 (H)  70 - 99 mg/dL Final   Glucose reference range applies only to samples taken after fasting for at least 8 hours.   Glucose-Capillary 02/13/2022 178 (H)  70 - 99 mg/dL Final   Glucose reference range applies only to samples taken after fasting for at least 8 hours.   Glucose-Capillary 02/14/2022 156 (H)  70 - 99 mg/dL Final   Glucose reference range applies only to samples taken after fasting for at least 8 hours.   Glucose-Capillary 02/14/2022 169 (H)  70 - 99 mg/dL Final   Glucose reference range applies only to samples taken after fasting for at least 8 hours.   Glucose-Capillary 02/14/2022 156 (H)  70 - 99 mg/dL Final   Glucose reference range applies only to samples taken after fasting for at least 8 hours.   Glucose-Capillary 02/14/2022  148 (H)  70 - 99 mg/dL Final   Glucose reference range applies only to samples taken after fasting for at least 8 hours.   Glucose-Capillary 02/15/2022 159 (H)  70 - 99 mg/dL Final   Glucose reference range applies only to samples taken after fasting for at least 8 hours.   Glucose-Capillary 02/15/2022 125 (H)  70 - 99 mg/dL Final   Glucose reference range applies only to samples taken after fasting for at least 8 hours.   Glucose-Capillary 02/15/2022 142 (H)  70 - 99 mg/dL Final   Glucose reference range applies only to samples taken after fasting for at least 8 hours.  Admission on 02/08/2022, Discharged on 02/09/2022  Component Date Value Ref Range Status   B Natriuretic Peptide 02/08/2022 40.2  0.0 - 100.0 pg/mL Final   Performed at [facility], [address] [place], [place] [address]   Troponin I (High Sensitivity) 02/08/2022 4  <18 ng/L Final   Comment: (NOTE) Elevated high sensitivity troponin I (hsTnI) values and significant  changes across serial measurements may suggest ACS but many other  chronic and acute conditions are known to elevate hsTnI results.  Refer to the "Links" section for chest pain algorithms and additional  guidance. Performed at [facility], [place] [address] [place], [place] [address]    WBC 02/08/2022 8.5  4.0 - 10.5 K/uL Final   RBC 02/08/2022 4.30  3.87 - 5.11 MIL/uL Final   Hemoglobin 02/08/2022 10.7 (L)  12.0 - 15.0 g/dL Final   HCT 02/08/2022 33.1 (L)  36.0 - 46.0 % Final   MCV 02/08/2022 77.0 (L)  80.0 - 100.0 fL Final   MCH 02/08/2022 24.9 (L)  26.0 - 34.0 pg Final   MCHC 02/08/2022 32.3  30.0 - 36.0 g/dL Final   RDW 02/08/2022 16.5 (H)  11.5 - 15.5 % Final   Platelets 02/08/2022 361  150 - 400 K/uL Final   nRBC  02/08/2022 0.0  0.0 - 0.2 % Final   Performed at [facility], [place] [address] [place], [place] [address]   Sodium 02/08/2022 131 (L)  135 - 145 mmol/L Final   Potassium 02/08/2022 4.4  3.5 - 5.1 mmol/L Final    Chloride 02/08/2022 96 (L)  98 - 111 mmol/L Final   CO2 02/08/2022 25  22 - 32 mmol/L Final   Glucose, Bld 02/08/2022 126 (H)  70 - 99 mg/dL Final   Glucose reference range applies only to samples taken after fasting for at least 8 hours.   BUN 02/08/2022 11  6 - 20 mg/dL Final   Creatinine, Ser 02/08/2022 0.52  0.44 - 1.00 mg/dL Final   Calcium 02/08/2022 9.4  8.9 - 10.3 mg/dL Final   Total Protein 02/08/2022 7.5  6.5 - 8.1 g/dL Final   Albumin 02/08/2022 3.6  3.5 - 5.0 g/dL Final   AST 02/08/2022 22  15 - 41 U/L Final   ALT 02/08/2022 20  0 - 44 U/L Final   Alkaline Phosphatase 02/08/2022 67  38 - 126 U/L Final   Total Bilirubin 02/08/2022 0.1 (L)  0.3 - 1.2 mg/dL Final   GFR, Estimated 02/08/2022 >60  >60 mL/min Final   Comment: (NOTE) Calculated using the CKD-EPI Creatinine Equation (2021)    Anion gap 02/08/2022 10  5 - 15 Final   Performed at [place] [address] [place], [place] [address]   Troponin I (High Sensitivity) 02/08/2022 5  <18 ng/L Final   Comment: (NOTE) Elevated high sensitivity troponin I (hsTnI) values and significant  changes across serial measurements may suggest ACS but many other  chronic and acute conditions are known to elevate hsTnI results.  Refer to the "Links" section for chest pain algorithms and additional  guidance. Performed at [facility], [place] [address], [place], [place] [address]    WBC 02/09/2022 7.3  4.0 - 10.5 K/uL Final   RBC 02/09/2022 4.70  3.87 - 5.11 MIL/uL Final   Hemoglobin 02/09/2022 11.4 (L)  12.0 - 15.0 g/dL Final   HCT 02/09/2022 36.6  36.0 - 46.0 % Final   MCV 02/09/2022 77.9 (L)  80.0 - 100.0 fL Final   MCH 02/09/2022 24.3 (L)  26.0 - 34.0 pg Final   MCHC 02/09/2022 31.1  30.0 - 36.0 g/dL Final   RDW 02/09/2022 16.6 (H)  11.5 - 15.5 % Final   Platelets 02/09/2022 403 (H)  150 - 400 K/uL Final   nRBC 02/09/2022 0.0  0.0 - 0.2 % Final   Performed at [place] [address], [place],  [place] [address]   Glucose-Capillary 02/08/2022 117 (H)  70 - 99 mg/dL Final   Glucose reference range applies only to samples taken after fasting for at least 8 hours.   Sodium 02/09/2022 138  135 - 145 mmol/L Final   Potassium 02/09/2022 3.7  3.5 - 5.1 mmol/L Final   Chloride 02/09/2022 103  98 - 111 mmol/L Final   CO2 02/09/2022 24  22 - 32 mmol/L Final   Glucose, Bld 02/09/2022 134 (H)  70 - 99 mg/dL Final   Glucose reference range applies only to samples taken after fasting for at least 8 hours.   BUN 02/09/2022 9  6 - 20 mg/dL Final   Creatinine, Ser 02/09/2022 0.44  0.44 - 1.00 mg/dL Final   Calcium 02/09/2022 8.3 (L)  8.9 - 10.3 mg/dL Final   GFR, Estimated  02/09/2022 >60  >60 mL/min Final   Comment: (NOTE) Calculated using the CKD-EPI Creatinine Equation (2021)    Anion gap 02/09/2022 11  5 - 15 Final   Performed at [facility], [place] [address] [place], [place] [address]   Iron 02/09/2022 20 (L)  28 - 170 ug/dL Final   TIBC 02/09/2022 455 (H)  250 - 450 ug/dL Final   Saturation Ratios 02/09/2022 4 (L)  10.4 - 31.8 % Final   UIBC 02/09/2022 435  ug/dL Final   Performed at [facility], [place] [address] [place], [place] [address]   Ferritin 02/09/2022 2 (L)  11 - 307 ng/mL Final   Performed at [place] [address], [place], [place] [address]   Weight 02/09/2022 2,846.58  oz Final   Height 02/09/2022 62  in Final   BP 02/09/2022 143/80  mmHg Final   WBC 02/09/2022 8.1  4.0 - 10.5 K/uL Final   RBC 02/09/2022 4.38  3.87 - 5.11 MIL/uL Final   Hemoglobin 02/09/2022 10.9 (L)  12.0 - 15.0 g/dL Final   HCT 02/09/2022 33.3 (L)  36.0 - 46.0 % Final   MCV 02/09/2022 76.0 (L)  80.0 - 100.0 fL Final   MCH 02/09/2022 24.9 (L)  26.0 - 34.0 pg Final   MCHC 02/09/2022 32.7  30.0 - 36.0 g/dL Final   RDW 02/09/2022 16.5 (H)  11.5 - 15.5 % Final   Platelets 02/09/2022 397  150 - 400 K/uL Final   nRBC 02/09/2022 0.0  0.0 - 0.2 % Final   Neutrophils Relative % 02/09/2022 66   % Final   Neutro Abs 02/09/2022 5.4  1.7 - 7.7 K/uL Final   Lymphocytes Relative 02/09/2022 24  % Final   Lymphs Abs 02/09/2022 1.9  0.7 - 4.0 K/uL Final   Monocytes Relative 02/09/2022 8  % Final   Monocytes Absolute 02/09/2022 0.6  0.1 - 1.0 K/uL Final   Eosinophils Relative 02/09/2022 1  % Final   Eosinophils Absolute 02/09/2022 0.1  0.0 - 0.5 K/uL Final   Basophils Relative 02/09/2022 1  % Final   Basophils Absolute 02/09/2022 0.1  0.0 - 0.1 K/uL Final   Immature Granulocytes 02/09/2022 0  % Final   Abs Immature Granulocytes 02/09/2022 0.03  0.00 - 0.07 K/uL Final   Performed at [facility], [place] [address], [place], [place] [address]   Glucose-Capillary 02/09/2022 238 (H)  70 - 99 mg/dL Final   Glucose reference range applies only to samples taken after fasting for at least 8 hours.   Glucose-Capillary 02/09/2022 91  70 - 99 mg/dL Final   Glucose reference range applies only to samples taken after fasting for at least 8 hours.   CRP 02/09/2022 <0.5  <1.0 mg/dL Final   Performed at [place] [address] [place],  [address]   Sed Rate 02/09/2022 15  0 - 22 mm/hr Final   Performed at [place] [address], [place],  [address]   Glucose-Capillary 02/09/2022 137 (H)  70 - 99 mg/dL Final   Glucose reference range applies only to samples taken after fasting for at least 8 hours.  Admission on 01/24/2022, Discharged on 02/08/2022  Component Date Value Ref Range Status   Glucose-Capillary 01/24/2022 143 (H)  70 - 99 mg/dL Final   Glucose reference range applies only to samples taken after fasting for at least 8 hours.   Glucose-Capillary 01/24/2022 120 (H)  70 - 99 mg/dL Final   Glucose reference range applies only to samples taken after fasting for at least 8 hours.   Glucose-Capillary 01/25/2022 122 (H)  70 - 99 mg/dL Final   Glucose reference range applies only to samples taken after fasting for at least 8 hours.   Glucose-Capillary  01/25/2022 190 (H)  70 - 99 mg/dL Final   Glucose reference range applies only to samples taken after fasting for at least 8 hours.   Glucose-Capillary 01/25/2022 163 (H)  70 - 99 mg/dL Final   Glucose reference range applies only to samples taken after fasting for at least 8 hours.   WBC 01/26/2022 6.5  4.0 - 10.5 K/uL Final   RBC 01/26/2022 4.53  3.87 - 5.11 MIL/uL Final   Hemoglobin 01/26/2022 11.3 (L)  12.0 - 15.0 g/dL Final   HCT 01/26/2022 36.4  36.0 - 46.0 % Final   MCV 01/26/2022 80.4  80.0 - 100.0 fL Final   MCH 01/26/2022 24.9 (L)  26.0 - 34.0 pg Final   MCHC 01/26/2022 31.0  30.0 - 36.0 g/dL Final   RDW 01/26/2022 17.3 (H)  11.5 - 15.5 % Final   Platelets 01/26/2022 327  150 - 400 K/uL Final   nRBC 01/26/2022 0.0  0.0 - 0.2 % Final   Neutrophils Relative % 01/26/2022 60  % Final   Neutro Abs 01/26/2022 3.9  1.7 - 7.7 K/uL Final   Lymphocytes Relative 01/26/2022 31  % Final   Lymphs Abs 01/26/2022 2.0  0.7 - 4.0 K/uL Final   Monocytes Relative 01/26/2022 7  % Final   Monocytes Absolute 01/26/2022 0.5  0.1 - 1.0 K/uL Final   Eosinophils Relative 01/26/2022 1  % Final   Eosinophils Absolute 01/26/2022 0.1  0.0 - 0.5 K/uL Final   Basophils Relative 01/26/2022 1  % Final   Basophils Absolute 01/26/2022 0.1  0.0 - 0.1 K/uL Final   Immature Granulocytes 01/26/2022 0  % Final   Abs Immature Granulocytes 01/26/2022 0.02  0.00 - 0.07 K/uL Final   Performed at [facility], [place] [address] [place], [place] [address]   Glucose-Capillary 01/26/2022 149 (H)  70 - 99 mg/dL Final   Glucose reference range applies only to samples taken after fasting for at least 8 hours.   Glucose-Capillary 01/26/2022 130 (H)  70 - 99 mg/dL Final   Glucose reference range applies only to samples taken after fasting for at least 8 hours.   Glucose-Capillary 01/26/2022 151 (H)  70 - 99 mg/dL Final   Glucose reference range applies only to samples taken after fasting for at least 8 hours.    Glucose-Capillary 01/26/2022 170 (H)  70 - 99 mg/dL Final   Glucose reference range applies only to samples taken after fasting for at least 8 hours.   Glucose-Capillary 01/27/2022 129 (H)  70 - 99 mg/dL Final   Glucose reference range applies only to samples taken after fasting for at least 8 hours.   Glucose-Capillary 01/27/2022 101 (H)  70 - 99 mg/dL Final   Glucose reference range applies only to samples taken after fasting for at least 8 hours.   Glucose-Capillary 01/27/2022 220 (H)  70 - 99 mg/dL Final   Glucose reference range applies only to samples taken after fasting for at least 8 hours.   Glucose-Capillary 01/27/2022 154 (H)  70 - 99 mg/dL Final   Glucose reference range applies only to samples taken after fasting for at least 8 hours.  Glucose-Capillary 01/27/2022 163 (H)  70 - 99 mg/dL Final   Glucose reference range applies only to samples taken after fasting for at least 8 hours.   Glucose-Capillary 01/28/2022 127 (H)  70 - 99 mg/dL Final   Glucose reference range applies only to samples taken after fasting for at least 8 hours.   Glucose-Capillary 01/28/2022 110 (H)  70 - 99 mg/dL Final   Glucose reference range applies only to samples taken after fasting for at least 8 hours.   Glucose-Capillary 01/28/2022 167 (H)  70 - 99 mg/dL Final   Glucose reference range applies only to samples taken after fasting for at least 8 hours.   Glucose-Capillary 01/29/2022 145 (H)  70 - 99 mg/dL Final   Glucose reference range applies only to samples taken after fasting for at least 8 hours.   Glucose-Capillary 01/29/2022 203 (H)  70 - 99 mg/dL Final   Glucose reference range applies only to samples taken after fasting for at least 8 hours.   Glucose-Capillary 01/29/2022 153 (H)  70 - 99 mg/dL Final   Glucose reference range applies only to samples taken after fasting for at least 8 hours.   Glucose-Capillary 01/29/2022 166 (H)  70 - 99 mg/dL Final   Glucose reference range applies only to  samples taken after fasting for at least 8 hours.   Glucose-Capillary 01/30/2022 142 (H)  70 - 99 mg/dL Final   Glucose reference range applies only to samples taken after fasting for at least 8 hours.   Glucose-Capillary 01/30/2022 138 (H)  70 - 99 mg/dL Final   Glucose reference range applies only to samples taken after fasting for at least 8 hours.   Glucose-Capillary 01/30/2022 185 (H)  70 - 99 mg/dL Final   Glucose reference range applies only to samples taken after fasting for at least 8 hours.   Glucose-Capillary 01/30/2022 220 (H)  70 - 99 mg/dL Final   Glucose reference range applies only to samples taken after fasting for at least 8 hours.   Glucose-Capillary 01/31/2022 147 (H)  70 - 99 mg/dL Final   Glucose reference range applies only to samples taken after fasting for at least 8 hours.   Glucose-Capillary 01/31/2022 131 (H)  70 - 99 mg/dL Final   Glucose reference range applies only to samples taken after fasting for at least 8 hours.   Glucose-Capillary 01/31/2022 169 (H)  70 - 99 mg/dL Final   Glucose reference range applies only to samples taken after fasting for at least 8 hours.   Glucose-Capillary 01/31/2022 144 (H)  70 - 99 mg/dL Final   Glucose reference range applies only to samples taken after fasting for at least 8 hours.   Comment 1 01/31/2022 RN AWARE   Final   Glucose-Capillary 02/01/2022 117 (H)  70 - 99 mg/dL Final   Glucose reference range applies only to samples taken after fasting for at least 8 hours.   Glucose-Capillary 02/01/2022 180 (H)  70 - 99 mg/dL Final   Glucose reference range applies only to samples taken after fasting for at least 8 hours.   Glucose-Capillary 02/01/2022 209 (H)  70 - 99 mg/dL Final   Glucose reference range applies only to samples taken after fasting for at least 8 hours.   Glucose-Capillary 02/02/2022 131 (H)  70 - 99 mg/dL Final   Glucose reference range applies only to samples taken after fasting for at least 8 hours.   WBC  02/02/2022 7.5  4.0 - 10.5 K/uL  Final   RBC 02/02/2022 4.52  3.87 - 5.11 MIL/uL Final   Hemoglobin 02/02/2022 11.3 (L)  12.0 - 15.0 g/dL Final   HCT 02/02/2022 35.0 (L)  36.0 - 46.0 % Final   MCV 02/02/2022 77.4 (L)  80.0 - 100.0 fL Final   MCH 02/02/2022 25.0 (L)  26.0 - 34.0 pg Final   MCHC 02/02/2022 32.3  30.0 - 36.0 g/dL Final   RDW 02/02/2022 16.7 (H)  11.5 - 15.5 % Final   Platelets 02/02/2022 345  150 - 400 K/uL Final   nRBC 02/02/2022 0.0  0.0 - 0.2 % Final   Neutrophils Relative % 02/02/2022 63  % Final   Neutro Abs 02/02/2022 4.7  1.7 - 7.7 K/uL Final   Lymphocytes Relative 02/02/2022 28  % Final   Lymphs Abs 02/02/2022 2.1  0.7 - 4.0 K/uL Final   Monocytes Relative 02/02/2022 7  % Final   Monocytes Absolute 02/02/2022 0.5  0.1 - 1.0 K/uL Final   Eosinophils Relative 02/02/2022 1  % Final   Eosinophils Absolute 02/02/2022 0.1  0.0 - 0.5 K/uL Final   Basophils Relative 02/02/2022 1  % Final   Basophils Absolute 02/02/2022 0.1  0.0 - 0.1 K/uL Final   Immature Granulocytes 02/02/2022 0  % Final   Abs Immature Granulocytes 02/02/2022 0.03  0.00 - 0.07 K/uL Final   Performed at [facility], [place] [address], [place], [place] [address]   Glucose-Capillary 02/02/2022 95  70 - 99 mg/dL Final   Glucose reference range applies only to samples taken after fasting for at least 8 hours.   Glucose-Capillary 02/02/2022 120 (H)  70 - 99 mg/dL Final   Glucose reference range applies only to samples taken after fasting for at least 8 hours.   Glucose-Capillary 02/02/2022 191 (H)  70 - 99 mg/dL Final   Glucose reference range applies only to samples taken after fasting for at least 8 hours.   Glucose-Capillary 02/03/2022 148 (H)  70 - 99 mg/dL Final   Glucose reference range applies only to samples taken after fasting for at least 8 hours.   Glucose-Capillary 02/03/2022 122 (H)  70 - 99 mg/dL Final   Glucose reference range applies only to samples taken after fasting for at least 8  hours.   Glucose-Capillary 02/03/2022 233 (H)  70 - 99 mg/dL Final   Glucose reference range applies only to samples taken after fasting for at least 8 hours.   Glucose-Capillary 02/04/2022 126 (H)  70 - 99 mg/dL Final   Glucose reference range applies only to samples taken after fasting for at least 8 hours.   Glucose-Capillary 02/04/2022 117 (H)  70 - 99 mg/dL Final   Glucose reference range applies only to samples taken after fasting for at least 8 hours.   Glucose-Capillary 02/04/2022 135 (H)  70 - 99 mg/dL Final   Glucose reference range applies only to samples taken after fasting for at least 8 hours.   Glucose-Capillary 02/04/2022 197 (H)  70 - 99 mg/dL Final   Glucose reference range applies only to samples taken after fasting for at least 8 hours.   Glucose-Capillary 02/05/2022 170 (H)  70 - 99 mg/dL Final   Glucose reference range applies only to samples taken after fasting for at least 8 hours.   Glucose-Capillary 02/05/2022 107 (H)  70 - 99 mg/dL Final   Glucose reference range applies only to samples taken after fasting for at least 8 hours.   Glucose-Capillary 02/05/2022  184 (H)  70 - 99 mg/dL Final   Glucose reference range applies only to samples taken after fasting for at least 8 hours.   Glucose-Capillary 02/05/2022 256 (H)  70 - 99 mg/dL Final   Glucose reference range applies only to samples taken after fasting for at least 8 hours.   Glucose-Capillary 02/06/2022 144 (H)  70 - 99 mg/dL Final   Glucose reference range applies only to samples taken after fasting for at least 8 hours.   Glucose-Capillary 02/06/2022 190 (H)  70 - 99 mg/dL Final   Glucose reference range applies only to samples taken after fasting for at least 8 hours.   Glucose-Capillary 02/06/2022 92  70 - 99 mg/dL Final   Glucose reference range applies only to samples taken after fasting for at least 8 hours.   Trichomonas 02/06/2022 Negative   Final   Bacterial Vaginitis-Urine 02/06/2022 Negative   Final    Molecular Comment 02/06/2022 For tests bacteria and/or candida, this specimen does not meet the   Final   Molecular Comment 02/06/2022 strict criteria set by the FDA. The result interpretation should be   Final   Molecular Comment 02/06/2022 considered in conjunction with the patient's clinical history.   Final   Comment 02/06/2022 Normal Reference Range Trichomonas - Negative   Final   Glucose-Capillary 02/06/2022 197 (H)  70 - 99 mg/dL Final   Glucose reference range applies only to samples taken after fasting for at least 8 hours.   Glucose-Capillary 02/07/2022 129 (H)  70 - 99 mg/dL Final   Glucose reference range applies only to samples taken after fasting for at least 8 hours.   Glucose-Capillary 02/07/2022 207 (H)  70 - 99 mg/dL Final   Glucose reference range applies only to samples taken after fasting for at least 8 hours.   Glucose-Capillary 02/07/2022 153 (H)  70 - 99 mg/dL Final   Glucose reference range applies only to samples taken after fasting for at least 8 hours.   Glucose-Capillary 02/08/2022 129 (H)  70 - 99 mg/dL Final   Glucose reference range applies only to samples taken after fasting for at least 8 hours.   Glucose-Capillary 02/08/2022 107 (H)  70 - 99 mg/dL Final   Glucose reference range applies only to samples taken after fasting for at least 8 hours.  Admission on 01/23/2022, Discharged on 01/24/2022  Component Date Value Ref Range Status   Sodium 01/23/2022 140  135 - 145 mmol/L Final   Potassium 01/23/2022 4.4  3.5 - 5.1 mmol/L Final   Chloride 01/23/2022 101  98 - 111 mmol/L Final   CO2 01/23/2022 25  22 - 32 mmol/L Final   Glucose, Bld 01/23/2022 198 (H)  70 - 99 mg/dL Final   Glucose reference range applies only to samples taken after fasting for at least 8 hours.   BUN 01/23/2022 13  6 - 20 mg/dL Final   Creatinine, Ser 01/23/2022 0.62  0.44 - 1.00 mg/dL Final   Calcium 01/23/2022 9.3  8.9 - 10.3 mg/dL Final   GFR, Estimated 01/23/2022 >60  >60 mL/min  Final   Comment: (NOTE) Calculated using the CKD-EPI Creatinine Equation (2021)    Anion gap 01/23/2022 14  5 - 15 Final   Performed at [facility], [place] [address], [place], [place] [address]   WBC 01/23/2022 5.8  4.0 - 10.5 K/uL Final   RBC 01/23/2022 4.63  3.87 - 5.11 MIL/uL Final   Hemoglobin 01/23/2022 11.7 (L)  12.0 -  15.0 g/dL Final   HCT 01/23/2022 37.4  36.0 - 46.0 % Final   MCV 01/23/2022 80.8  80.0 - 100.0 fL Final   MCH 01/23/2022 25.3 (L)  26.0 - 34.0 pg Final   MCHC 01/23/2022 31.3  30.0 - 36.0 g/dL Final   RDW 01/23/2022 17.9 (H)  11.5 - 15.5 % Final   Platelets 01/23/2022 333  150 - 400 K/uL Final   nRBC 01/23/2022 0.0  0.0 - 0.2 % Final   Performed at [facility], [place] [address], [place], [place] [address]   Troponin I (High Sensitivity) 01/23/2022 6  <18 ng/L Final   Comment: (NOTE) Elevated high sensitivity troponin I (hsTnI) values and significant  changes across serial measurements may suggest ACS but many other  chronic and acute conditions are known to elevate hsTnI results.  Refer to the "Links" section for chest pain algorithms and additional  guidance. Performed at [facility], [place] [address], [place], [place] [address]    Troponin I (High Sensitivity) 01/23/2022 5  <18 ng/L Final   Comment: (NOTE) Elevated high sensitivity troponin I (hsTnI) values and significant  changes across serial measurements may suggest ACS but many other  chronic and acute conditions are known to elevate hsTnI results.  Refer to the "Links" section for chest pain algorithms and additional  guidance. Performed at [facility], [place] [address] [place], [place] [address]    SARS Coronavirus 2 by RT PCR 01/23/2022 NEGATIVE  NEGATIVE Final   Comment: (NOTE) SARS-CoV-2 target nucleic acids are NOT DETECTED.  The SARS-CoV-2 RNA is generally detectable in upper and lower respiratory specimens during the acute phase of infection. The lowest concentration  of SARS-CoV-2 viral copies this assay can detect is 250 copies / mL. A negative result does not preclude SARS-CoV-2 infection and should not be used as the sole basis for treatment or other patient management decisions.  A negative result may occur with improper specimen collection / handling, submission of specimen other than nasopharyngeal swab, presence of viral mutation(s) within the areas targeted by this assay, and inadequate number of viral copies (<250 copies / mL). A negative result must be combined with clinical observations, patient history, and epidemiological information.  Fact Sheet for Patients:   https://www.patel.info/  Fact Sheet for Healthcare Providers: https://hall.com/  This test is not yet approved or                           cleared by the [place] FDA and has been authorized for detection and/or diagnosis of SARS-CoV-2 by FDA under an Emergency Use Authorization (EUA).  This EUA will remain in effect (meaning this test can be used) for the duration of the COVID-19 declaration under Section 564(b)(1) of the Act, 21 U.S.C. section 360bbb-3(b)(1), unless the authorization is terminated or revoked sooner.  Performed at [facility], [place] [address] [place], [place] [address]    B Natriuretic Peptide 01/23/2022 41.3  0.0 - 100.0 pg/mL Final   Performed at [place] [address] [place], [place] [address]   Group A Strep by PCR 01/23/2022 NOT DETECTED  NOT DETECTED Final   Performed at [facility], [place] [address] [place], [place] [address]   Color, Urine 01/23/2022 YELLOW  YELLOW Final   APPearance 01/23/2022 CLEAR  CLEAR Final   Specific Gravity, Urine 01/23/2022 1.018  1.005 - 1.030 Final   pH  01/23/2022 7.0  5.0 - 8.0 Final   Glucose, UA 01/23/2022 NEGATIVE  NEGATIVE mg/dL Final   Hgb urine dipstick 01/23/2022 NEGATIVE  NEGATIVE Final   Bilirubin Urine 01/23/2022 NEGATIVE  NEGATIVE  Final   Ketones, ur 01/23/2022 NEGATIVE  NEGATIVE mg/dL Final   Protein, ur 01/23/2022 NEGATIVE  NEGATIVE mg/dL Final   Nitrite 01/23/2022 NEGATIVE  NEGATIVE Final   Leukocytes,Ua 01/23/2022 TRACE (A)  NEGATIVE Final   RBC / HPF 01/23/2022 0-5  0 - 5 RBC/hpf Final   WBC, UA 01/23/2022 0-5  0 - 5 WBC/hpf Final   Bacteria, UA 01/23/2022 NONE SEEN  NONE SEEN Final   Squamous Epithelial / LPF 01/23/2022 0-5  0 - 5 Final   Mucus 01/23/2022 PRESENT   Final   Performed at [facility], [address], [place], [place] [address]   SARS Coronavirus 2 by RT PCR 01/23/2022 NEGATIVE  NEGATIVE Final   Comment: (NOTE) SARS-CoV-2 target nucleic acids are NOT DETECTED.  The SARS-CoV-2 RNA is generally detectable in upper respiratory specimens during the acute phase of infection. The lowest concentration of SARS-CoV-2 viral copies this assay can detect is 138 copies/mL. A negative result does not preclude SARS-Cov-2 infection and should not be used as the sole basis for treatment or other patient management decisions. A negative result may occur with  improper specimen collection/handling, submission of specimen other than nasopharyngeal swab, presence of viral mutation(s) within the areas targeted by this assay, and inadequate number of viral copies(<138 copies/mL). A negative result must be combined with clinical observations, patient history, and epidemiological information. The expected result is Negative.  Fact Sheet for Patients:  EntrepreneurPulse.com.au  Fact Sheet for Healthcare Providers:  IncredibleEmployment.be  This test is no                          t yet approved or cleared by the [place] FDA and  has been authorized for detection and/or diagnosis of SARS-CoV-2 by FDA under an Emergency Use Authorization (EUA). This EUA will remain  in effect (meaning this test can be used) for the duration of the COVID-19 declaration under Section  564(b)(1) of the Act, 21 U.S.C.section 360bbb-3(b)(1), unless the authorization is terminated  or revoked sooner.       Influenza A by PCR 01/23/2022 NEGATIVE  NEGATIVE Final   Influenza B by PCR 01/23/2022 NEGATIVE  NEGATIVE Final   Comment: (NOTE) The Xpert Xpress SARS-CoV-2/FLU/RSV plus assay is intended as an aid in the diagnosis of influenza from Nasopharyngeal swab specimens and should not be used as a sole basis for treatment. Nasal washings and aspirates are unacceptable for Xpert Xpress SARS-CoV-2/FLU/RSV testing.  Fact Sheet for Patients: EntrepreneurPulse.com.au  Fact Sheet for Healthcare Providers: IncredibleEmployment.be  This test is not yet approved or cleared by the [place] FDA and has been authorized for detection and/or diagnosis of SARS-CoV-2 by FDA under an Emergency Use Authorization (EUA). This EUA will remain in effect (meaning this test can be used) for the duration of the COVID-19 declaration under Section 564(b)(1) of the Act, 21 U.S.C. section 360bbb-3(b)(1), unless the authorization is terminated or revoked.  Performed at [facility], [place] [address], [place], [place] [address]    WBC 01/24/2022 6.1  4.0 - 10.5 K/uL Final   RBC 01/24/2022 4.60  3.87 - 5.11 MIL/uL Final   Hemoglobin 01/24/2022 11.7 (L)  12.0 - 15.0 g/dL Final   HCT 01/24/2022 37.0  36.0 - 46.0 % Final   MCV 01/24/2022 80.4  80.0 - 100.0 fL Final   MCH 01/24/2022 25.4 (L)  26.0 - 34.0 pg Final   MCHC 01/24/2022 31.6  30.0 - 36.0 g/dL Final   RDW 01/24/2022 17.6 (H)  11.5 - 15.5 % Final   Platelets 01/24/2022 334  150 - 400 K/uL Final   nRBC 01/24/2022 0.0  0.0 - 0.2 % Final   Neutrophils Relative % 01/24/2022 53  % Final   Neutro Abs 01/24/2022 3.3  1.7 - 7.7 K/uL Final   Lymphocytes Relative 01/24/2022 33  % Final   Lymphs Abs 01/24/2022 2.0  0.7 - 4.0 K/uL Final   Monocytes Relative 01/24/2022 11  % Final   Monocytes Absolute  01/24/2022 0.7  0.1 - 1.0 K/uL Final   Eosinophils Relative 01/24/2022 2  % Final   Eosinophils Absolute 01/24/2022 0.1  0.0 - 0.5 K/uL Final   Basophils Relative 01/24/2022 1  % Final   Basophils Absolute 01/24/2022 0.1  0.0 - 0.1 K/uL Final   Immature Granulocytes 01/24/2022 0  % Final   Abs Immature Granulocytes 01/24/2022 0.02  0.00 - 0.07 K/uL Final   Performed at [facility], [place] [address] [place],  [address]   Glucose-Capillary 01/24/2022 110 (H)  70 - 99 mg/dL Final   Glucose reference range applies only to samples taken after fasting for at least 8 hours.  No results displayed because visit has over 200 results.    Admission on 11/22/2021, Discharged on 11/22/2021  Component Date Value Ref Range Status   Glucose-Capillary 11/22/2021 169 (H)  70 - 99 mg/dL Final   Glucose reference range applies only to samples taken after fasting for at least 8 hours.  No results displayed because visit has over 200 results.    Admission on 10/04/2021, Discharged on 10/22/2021  Component Date Value Ref Range Status   SARS Coronavirus 2 by RT PCR 10/04/2021 NEGATIVE  NEGATIVE Final   Comment: (NOTE) SARS-CoV-2 target nucleic acids are NOT DETECTED.  The SARS-CoV-2 RNA is generally detectable in upper respiratory specimens during the acute phase of infection. The lowest concentration of SARS-CoV-2 viral copies this assay can detect is 138 copies/mL. A negative result does not preclude SARS-Cov-2 infection and should not be used as the sole basis for treatment or other patient management decisions. A negative result may occur with  improper specimen collection/handling, submission of specimen other than nasopharyngeal swab, presence of viral mutation(s) within the areas targeted by this assay, and inadequate number of viral copies(<138 copies/mL). A negative result must be combined with clinical observations, patient history, and epidemiological information. The expected  result is Negative.  Fact Sheet for Patients:  EntrepreneurPulse.com.au  Fact Sheet for Healthcare Providers:  IncredibleEmployment.be  This test is no                          t yet approved or cleared by the [place] FDA and  has been authorized for detection and/or diagnosis of SARS-CoV-2 by FDA under an Emergency Use Authorization (EUA). This EUA will remain  in effect (meaning this test can be used) for the duration of the COVID-19 declaration under Section 564(b)(1) of the Act, 21 U.S.C.section 360bbb-3(b)(1), unless the authorization is terminated  or revoked sooner.       Influenza A by PCR 10/04/2021 NEGATIVE  NEGATIVE Final   Influenza B by PCR 10/04/2021 NEGATIVE  NEGATIVE Final  Comment: (NOTE) The Xpert Xpress SARS-CoV-2/FLU/RSV plus assay is intended as an aid in the diagnosis of influenza from Nasopharyngeal swab specimens and should not be used as a sole basis for treatment. Nasal washings and aspirates are unacceptable for Xpert Xpress SARS-CoV-2/FLU/RSV testing.  Fact Sheet for Patients: EntrepreneurPulse.com.au  Fact Sheet for Healthcare Providers: IncredibleEmployment.be  This test is not yet approved or cleared by the [place] FDA and has been authorized for detection and/or diagnosis of SARS-CoV-2 by FDA under an Emergency Use Authorization (EUA). This EUA will remain in effect (meaning this test can be used) for the duration of the COVID-19 declaration under Section 564(b)(1) of the Act, 21 U.S.C. section 360bbb-3(b)(1), unless the authorization is terminated or revoked.  Performed at [facility], [place] [address], [place], [place] [address]    WBC 10/04/2021 8.4  4.0 - 10.5 K/uL Final   RBC 10/04/2021 4.42  3.87 - 5.11 MIL/uL Final   Hemoglobin 10/04/2021 11.6 (L)  12.0 - 15.0 g/dL Final   HCT 10/04/2021 35.7 (L)  36.0 - 46.0 % Final   MCV 10/04/2021 80.8   80.0 - 100.0 fL Final   MCH 10/04/2021 26.2  26.0 - 34.0 pg Final   MCHC 10/04/2021 32.5  30.0 - 36.0 g/dL Final   RDW 10/04/2021 16.0 (H)  11.5 - 15.5 % Final   Platelets 10/04/2021 372  150 - 400 K/uL Final   nRBC 10/04/2021 0.0  0.0 - 0.2 % Final   Neutrophils Relative % 10/04/2021 68  % Final   Neutro Abs 10/04/2021 5.7  1.7 - 7.7 K/uL Final   Lymphocytes Relative 10/04/2021 22  % Final   Lymphs Abs 10/04/2021 1.8  0.7 - 4.0 K/uL Final   Monocytes Relative 10/04/2021 8  % Final   Monocytes Absolute 10/04/2021 0.7  0.1 - 1.0 K/uL Final   Eosinophils Relative 10/04/2021 1  % Final   Eosinophils Absolute 10/04/2021 0.1  0.0 - 0.5 K/uL Final   Basophils Relative 10/04/2021 1  % Final   Basophils Absolute 10/04/2021 0.1  0.0 - 0.1 K/uL Final   Immature Granulocytes 10/04/2021 0  % Final   Abs Immature Granulocytes 10/04/2021 0.03  0.00 - 0.07 K/uL Final   Performed at [facility], [place] [address] [place], [place] [address]   Sodium 10/04/2021 136  135 - 145 mmol/L Final   Potassium 10/04/2021 4.2  3.5 - 5.1 mmol/L Final   Chloride 10/04/2021 104  98 - 111 mmol/L Final   CO2 10/04/2021 25  22 - 32 mmol/L Final   Glucose, Bld 10/04/2021 100 (H)  70 - 99 mg/dL Final   Glucose reference range applies only to samples taken after fasting for at least 8 hours.   BUN 10/04/2021 9  6 - 20 mg/dL Final   Creatinine, Ser 10/04/2021 0.52  0.44 - 1.00 mg/dL Final   Calcium 10/04/2021 9.0  8.9 - 10.3 mg/dL Final   Total Protein 10/04/2021 7.0  6.5 - 8.1 g/dL Final   Albumin 10/04/2021 3.2 (L)  3.5 - 5.0 g/dL Final   AST 10/04/2021 13 (L)  15 - 41 U/L Final   ALT 10/04/2021 10  0 - 44 U/L Final   Alkaline Phosphatase 10/04/2021 61  38 - 126 U/L Final   Total Bilirubin 10/04/2021 0.3  0.3 - 1.2 mg/dL Final   GFR, Estimated 10/04/2021 >60  >60 mL/min Final   Comment: (NOTE) Calculated using the CKD-EPI Creatinine Equation (2021)  Anion gap 10/04/2021 7  5 - 15 Final   Performed at [place] [address] [place], [place] [address]   Hgb A1c MFr Bld 10/04/2021 7.0 (H)  4.8 - 5.6 % Final   Comment: (NOTE) Pre diabetes:          5.7%-6.4%  Diabetes:              >6.4%  Glycemic control for   <7.0% adults with diabetes    Mean Plasma Glucose 10/04/2021 154.2  mg/dL Final   Performed at [facility], [place] [address], [place], [place] [address]   Cholesterol 10/04/2021 178  0 - 200 mg/dL Final   Triglycerides 10/04/2021 155 (H)  <150 mg/dL Final   HDL 10/04/2021 52  >40 mg/dL Final   Total CHOL/HDL Ratio 10/04/2021 3.4  RATIO Final   VLDL 10/04/2021 31  0 - 40 mg/dL Final   LDL Cholesterol 10/04/2021 95  0 - 99 mg/dL Final   Comment:        Total Cholesterol/HDL:CHD Risk Coronary Heart Disease Risk Table                     Men   Women  1/2 Average Risk   3.4   3.3  Average Risk       5.0   4.4  2 X Average Risk   9.6   7.1  3 X Average Risk  23.4   11.0        Use the calculated Patient Ratio above and the CHD Risk Table to determine the patient's CHD Risk.        ATP III CLASSIFICATION (LDL):  <100     mg/dL   Optimal  100-129  mg/dL   Near or Above                    Optimal  130-159  mg/dL   Borderline  160-189  mg/dL   High  >190     mg/dL   Very High Performed at [place] [address] [place], [place] [address]    POC Amphetamine UR 10/04/2021 None Detected  NONE DETECTED (Cut Off Level 1000 ng/mL) Final   POC Secobarbital (BAR) 10/04/2021 None Detected  NONE DETECTED (Cut Off Level 300 ng/mL) Final   POC Buprenorphine (BUP) 10/04/2021 None Detected  NONE DETECTED (Cut Off Level 10 ng/mL) Final   POC Oxazepam (BZO) 10/04/2021 None Detected  NONE DETECTED (Cut Off Level 300 ng/mL) Final   POC Cocaine UR 10/04/2021 None Detected  NONE DETECTED (Cut Off Level 300 ng/mL) Final   POC Methamphetamine UR 10/04/2021 None Detected  NONE DETECTED (Cut Off Level 1000 ng/mL) Final   POC Morphine 10/04/2021 None Detected  NONE DETECTED  (Cut Off Level 300 ng/mL) Final   POC Methadone UR 10/04/2021 None Detected  NONE DETECTED (Cut Off Level 300 ng/mL) Final   POC Oxycodone UR 10/04/2021 Positive (A)  NONE DETECTED (Cut Off Level 100 ng/mL) Final   POC Marijuana UR 10/04/2021 None Detected  NONE DETECTED (Cut Off Level 50 ng/mL) Final   SARSCOV2ONAVIRUS 2 AG 10/04/2021 NEGATIVE  NEGATIVE Final   Comment: (NOTE) SARS-CoV-2 antigen NOT DETECTED.   Negative results are presumptive.  Negative results do not preclude SARS-CoV-2 infection and should not be used as the sole basis for treatment or other patient management decisions, including infection  control decisions, particularly in the presence of clinical signs  and  symptoms consistent with COVID-19, or in those who have been in contact with the virus.  Negative results must be combined with clinical observations, patient history, and epidemiological information. The expected result is Negative.  Fact Sheet for Patients: HandmadeRecipes.com.cy  Fact Sheet for Healthcare Providers: FuneralLife.at  This test is not yet approved or cleared by the [place] FDA and  has been authorized for detection and/or diagnosis of SARS-CoV-2 by FDA under an Emergency Use Authorization (EUA).  This EUA will remain in effect (meaning this test can be used) for the duration of  the COV                          ID-19 declaration under Section 564(b)(1) of the Act, 21 U.S.C. section 360bbb-3(b)(1), unless the authorization is terminated or revoked sooner.     Valproic Acid Lvl 10/04/2021 51  50.0 - 100.0 ug/mL Final   Performed at [facility] [address] [place], [place] [address]   Valproic Acid Lvl 10/08/2021 57  50.0 - 100.0 ug/mL Final   Performed at [facility] [address] [place], [place] [address]   Glucose-Capillary 10/09/2021 123 (H)  70 - 99 mg/dL Final   Glucose reference range applies only to samples  taken after fasting for at least 8 hours.  Admission on 09/09/2021, Discharged on 09/10/2021  Component Date Value Ref Range Status   Sodium 09/09/2021 134 (L)  135 - 145 mmol/L Final   Potassium 09/09/2021 4.3  3.5 - 5.1 mmol/L Final   Chloride 09/09/2021 99  98 - 111 mmol/L Final   CO2 09/09/2021 25  22 - 32 mmol/L Final   Glucose, Bld 09/09/2021 107 (H)  70 - 99 mg/dL Final   Glucose reference range applies only to samples taken after fasting for at least 8 hours.   BUN 09/09/2021 12  6 - 20 mg/dL Final   Creatinine, Ser 09/09/2021 0.74  0.44 - 1.00 mg/dL Final   Calcium 09/09/2021 9.0  8.9 - 10.3 mg/dL Final   Total Protein 09/09/2021 7.4  6.5 - 8.1 g/dL Final   Albumin 09/09/2021 3.1 (L)  3.5 - 5.0 g/dL Final   AST 09/09/2021 13 (L)  15 - 41 U/L Final   ALT 09/09/2021 13  0 - 44 U/L Final   Alkaline Phosphatase 09/09/2021 66  38 - 126 U/L Final   Total Bilirubin 09/09/2021 0.2 (L)  0.3 - 1.2 mg/dL Final   GFR, Estimated 09/09/2021 >60  >60 mL/min Final   Comment: (NOTE) Calculated using the CKD-EPI Creatinine Equation (2021)    Anion gap 09/09/2021 10  5 - 15 Final   Performed at [facility], [place] [address], [place], [place] [address]   WBC 09/09/2021 8.5  4.0 - 10.5 K/uL Final   RBC 09/09/2021 4.53  3.87 - 5.11 MIL/uL Final   Hemoglobin 09/09/2021 11.8 (L)  12.0 - 15.0 g/dL Final   HCT 09/09/2021 38.0  36.0 - 46.0 % Final   MCV 09/09/2021 83.9  80.0 - 100.0 fL Final   MCH 09/09/2021 26.0  26.0 - 34.0 pg Final   MCHC 09/09/2021 31.1  30.0 - 36.0 g/dL Final   RDW 09/09/2021 17.8 (H)  11.5 - 15.5 % Final   Platelets 09/09/2021 442 (H)  150 - 400 K/uL Final   nRBC 09/09/2021 0.0  0.0 - 0.2 % Final   Neutrophils Relative % 09/09/2021 70  %  Final   Neutro Abs 09/09/2021 5.9  1.7 - 7.7 K/uL Final   Lymphocytes Relative 09/09/2021 20  % Final   Lymphs Abs 09/09/2021 1.7  0.7 - 4.0 K/uL Final   Monocytes Relative 09/09/2021 8  % Final   Monocytes Absolute 09/09/2021 0.7   0.1 - 1.0 K/uL Final   Eosinophils Relative 09/09/2021 1  % Final   Eosinophils Absolute 09/09/2021 0.1  0.0 - 0.5 K/uL Final   Basophils Relative 09/09/2021 1  % Final   Basophils Absolute 09/09/2021 0.1  0.0 - 0.1 K/uL Final   Immature Granulocytes 09/09/2021 0  % Final   Abs Immature Granulocytes 09/09/2021 0.03  0.00 - 0.07 K/uL Final   Performed at [facility] [address] [place], [place] [address]   Ammonia 09/09/2021 37 (H)  9 - 35 umol/L Final   Comment: HEMOLYSIS AT THIS LEVEL MAY AFFECT RESULT Performed at [facility], [place] [address] [place], [place] [address]    Opiates 09/09/2021 NONE DETECTED  NONE DETECTED Final   Cocaine 09/09/2021 NONE DETECTED  NONE DETECTED Final   Benzodiazepines 09/09/2021 NONE DETECTED  NONE DETECTED Final   Amphetamines 09/09/2021 NONE DETECTED  NONE DETECTED Final   Tetrahydrocannabinol 09/09/2021 NONE DETECTED  NONE DETECTED Final   Barbiturates 09/09/2021 NONE DETECTED  NONE DETECTED Final   Comment: (NOTE) DRUG SCREEN FOR MEDICAL PURPOSES ONLY.  IF CONFIRMATION IS NEEDED FOR ANY PURPOSE, NOTIFY LAB WITHIN 5 DAYS.  LOWEST DETECTABLE LIMITS FOR URINE DRUG SCREEN Drug Class                     Cutoff (ng/mL) Amphetamine and metabolites    1000 Barbiturate and metabolites    200 Benzodiazepine                 163 Tricyclics and metabolites     300 Opiates and metabolites        300 Cocaine and metabolites        300 THC                            50 Performed at [facility], [place] [address], [place], [place] [address]    Alcohol, Ethyl (B) 09/09/2021 <10  <10 mg/dL Final   Comment: (NOTE) Lowest detectable limit for serum alcohol is 10 mg/dL.  For medical purposes only. Performed at [facility], [place] [address] [place], [place] [address]    Lipase 09/09/2021 33  11 - 51 U/L Final   Performed at [facility] [address], [place], [place] [address]   Color, Urine 09/09/2021 COLORLESS (A)   YELLOW Final   APPearance 09/09/2021 CLEAR  CLEAR Final   Specific Gravity, Urine 09/09/2021 1.002 (L)  1.005 - 1.030 Final   pH 09/09/2021 6.0  5.0 - 8.0 Final   Glucose, UA 09/09/2021 NEGATIVE  NEGATIVE mg/dL Final   Hgb urine dipstick 09/09/2021 NEGATIVE  NEGATIVE Final   Bilirubin Urine 09/09/2021 NEGATIVE  NEGATIVE Final   Ketones, ur 09/09/2021 NEGATIVE  NEGATIVE mg/dL Final   Protein, ur 09/09/2021 NEGATIVE  NEGATIVE mg/dL Final   Nitrite 09/09/2021 NEGATIVE  NEGATIVE Final   Leukocytes,Ua 09/09/2021 NEGATIVE  NEGATIVE Final   Performed at [facility] [address] [place], [place] [address]   Specimen Description 09/09/2021 URINE, CLEAN CATCH   Final   Special Requests 09/09/2021 NONE  Final   Culture 09/09/2021  (A)   Final                   Value:<10,000 COLONIES/mL INSIGNIFICANT GROWTH Performed at [facility], [place] [address], [place], [place] [address]    Report Status 09/09/2021 09/10/2021 FINAL   Final   D-Dimer, Quant 09/09/2021 0.92 (H)  0.00 - 0.50 ug/mL-FEU Final   Comment: (NOTE) At the manufacturer cut-off value of 0.5 g/mL FEU, this assay has a negative predictive value of 95-100%.This assay is intended for use in conjunction with a clinical pretest probability (PTP) assessment model to exclude pulmonary embolism (PE) and deep venous thrombosis (DVT) in outpatients suspected of PE or DVT. Results should be correlated with clinical presentation. Performed at [facility], [place] [address] [place], [place] [address]    Troponin I (High Sensitivity) 09/09/2021 5  <18 ng/L Final   Comment: (NOTE) Elevated high sensitivity troponin I (hsTnI) values and significant  changes across serial measurements may suggest ACS but many other  chronic and acute conditions are known to elevate hsTnI results.  Refer to the "Links" section for chest pain algorithms and additional  guidance. Performed at [facility], [place] [address],  [place],  [address]    BP 09/10/2021 135/83  mmHg Final   S' Lateral 09/10/2021 2.30  cm Final   Area-P 1/2 09/10/2021 5.58  cm2 Final    Blood Alcohol level:  Lab Results  Component Value Date   ETH <10 11/23/2021   ETH <10 44/62/8638    Metabolic Disorder Labs: Lab Results  Component Value Date   HGBA1C 6.7 (H) 12/28/2021   MPG 145.59 12/28/2021   MPG 154.2 10/04/2021   Lab Results  Component Value Date   PROLACTIN 3.8 (L) 11/23/2021   Lab Results  Component Value Date   CHOL 171 11/23/2021   TRIG 125 11/23/2021   HDL 65 11/23/2021   CHOLHDL 2.6 11/23/2021   VLDL 25 11/23/2021   LDLCALC 81 11/23/2021   LDLCALC 95 10/04/2021    Therapeutic Lab Levels: No results found for: "LITHIUM" Lab Results  Component Value Date   VALPROATE 33 (L) 01/18/2022   VALPROATE 33 (L) 12/28/2021   No results found for: "CBMZ"  Physical Findings   PHQ2-9    Flowsheet Row ED from 11/23/2021 in [facility]  PHQ-2 Total Score 2  PHQ-9 Total Score 3      Flowsheet Row ED from 02/09/2022 in [facility] ED from 02/08/2022 in [facility] ED from 01/24/2022 in [facility] No Risk No Risk No Risk        Musculoskeletal  Strength & Muscle Tone: within normal limits Gait & Station: normal Patient leans: N/A  Psychiatric Specialty Exam  Presentation  General Appearance:  Appropriate for Environment; Casual  Eye Contact: Good  Speech: Clear and Coherent; Normal Rate  Speech Volume: Normal  Handedness: Right   Mood and Affect  Mood: Euthymic  Affect: Congruent   Thought Process  Thought Processes: Coherent  Descriptions of Associations:Intact  Orientation:Full (Time, Place and Person)  Thought Content:Logical  Diagnosis of Schizophrenia or Schizoaffective disorder in past: Yes  Duration of Psychotic  Symptoms: No data recorded  Hallucinations:Hallucinations: None  Ideas of Reference:None  Suicidal Thoughts:Suicidal Thoughts: No  Homicidal Thoughts:Homicidal Thoughts: No   Sensorium  Memory: Immediate Good; Recent Good; Remote Good  Judgment: Intact  Insight: Present   Executive Functions  Concentration: Good  Attention Span: Good  Recall: Good  Fund of Knowledge: Good  Language: Good   Psychomotor Activity  Psychomotor Activity: Psychomotor Activity: Normal   Assets  Assets: Communication Skills; Desire for Improvement; Financial Resources/Insurance; Physical Health; Resilience   Sleep  Sleep: Sleep: Good   No data recorded  Physical Exam  Physical Exam Vitals and nursing note reviewed.  Constitutional:      General: She is not in acute distress.    Appearance: She is not ill-appearing.  HENT:     Head: Normocephalic.  Eyes:     General:        Right eye: No discharge.        Left eye: No discharge.     Conjunctiva/sclera: Conjunctivae normal.  Cardiovascular:     Rate and Rhythm: Normal rate.  Pulmonary:     Effort: Pulmonary effort is normal. No respiratory distress.  Musculoskeletal:        General: Normal range of motion.     Cervical back: Normal range of motion.  Skin:    Coloration: Skin is not jaundiced or pale.  Neurological:     Mental Status: She is alert and oriented to person, place, and time.  Psychiatric:        Attention and Perception: Attention and perception normal.        Mood and Affect: Mood and affect normal.        Speech: Speech normal.        Behavior: Behavior normal.        Thought Content: Thought content normal.        Cognition and Memory: Cognition normal.        Judgment: Judgment normal.    Review of Systems  Constitutional: Negative.   HENT: Negative.    Eyes: Negative.   Respiratory: Negative.    Cardiovascular: Negative.   Musculoskeletal: Negative.   Skin: Negative.    Neurological: Negative.   Psychiatric/Behavioral: Negative.     Blood pressure 109/76, pulse (!) 115, temperature 98.3 F (36.8 C), temperature source Oral, resp. rate 16, SpO2 95 %. There is no height or weight on file to calculate BMI.  Treatment Plan Summary: Patient continues to be psychiatrically cleared.  DSS and social work continue to seek placement.  Patient will continue to board on the unit.  Will continue to have daily contact with patient to assess and evaluate symptoms and progress in treatment and Medication management  Revonda Humphrey, NP 02/15/2022 9:38 PM

## 2022-02-15 NOTE — ED Notes (Signed)
Pt had bedtime snack. 

## 2022-02-16 DIAGNOSIS — F209 Schizophrenia, unspecified: Secondary | ICD-10-CM | POA: Diagnosis not present

## 2022-02-16 LAB — GLUCOSE, CAPILLARY
Glucose-Capillary: 137 mg/dL — ABNORMAL HIGH (ref 70–99)
Glucose-Capillary: 131 mg/dL — ABNORMAL HIGH (ref 70–99)
Glucose-Capillary: 123 mg/dL — ABNORMAL HIGH (ref 70–99)
Glucose-Capillary: 195 mg/dL — ABNORMAL HIGH (ref 70–99)

## 2022-02-16 LAB — CBC WITH DIFFERENTIAL/PLATELET
Abs Immature Granulocytes: 0.04 10*3/uL (ref 0.00–0.07)
Basophils Absolute: 0.1 10*3/uL (ref 0.0–0.1)
Basophils Relative: 2 %
Eosinophils Absolute: 0.1 10*3/uL (ref 0.0–0.5)
Eosinophils Relative: 2 %
HCT: 35.4 % — ABNORMAL LOW (ref 36.0–46.0)
Hemoglobin: 10.7 g/dL — ABNORMAL LOW (ref 12.0–15.0)
Immature Granulocytes: 1 %
Lymphocytes Relative: 35 %
Lymphs Abs: 2.1 10*3/uL (ref 0.7–4.0)
MCH: 23.9 pg — ABNORMAL LOW (ref 26.0–34.0)
MCHC: 30.2 g/dL (ref 30.0–36.0)
MCV: 79 fL — ABNORMAL LOW (ref 80.0–100.0)
Monocytes Absolute: 0.4 10*3/uL (ref 0.1–1.0)
Monocytes Relative: 6 %
Neutro Abs: 3.2 10*3/uL (ref 1.7–7.7)
Neutrophils Relative %: 54 %
Platelets: 387 10*3/uL (ref 150–400)
RBC: 4.48 MIL/uL (ref 3.87–5.11)
RDW: 17 % — ABNORMAL HIGH (ref 11.5–15.5)
WBC: 6 10*3/uL (ref 4.0–10.5)
nRBC: 0 % (ref 0.0–0.2)

## 2022-02-16 NOTE — ED Notes (Signed)
Patient observed/assessed in bed/chair resting quietly appearing in no distress and verbalizing no complaints at this time. Will continue to monitor.  

## 2022-02-16 NOTE — Progress Notes (Signed)
PHARMACIST - PHYSICIAN ORDER COMMUNICATION  Mozell Haber is a 61 y.o. year old female with a history of schizophrenia on Clozapine PTA. Continuing this medication order as an inpatient requires that monitoring parameters per REMS requirements must be met.   Clozapine REMS Dispense Authorization was obtained with RDA# [id number]  Last ANC value and date reported on the Clozapine REMS website: 02/16/22  3200. Next [place] due 12/26  [name] 02/16/2022, 9:52 AM

## 2022-02-16 NOTE — ED Notes (Signed)
Patient observed/assessed at bedside lying in bed. Patient alert and oriented x 4. Affect is 'DESCRIBE". Patient denies pain and anxiety. He denies A/V/H. He denies having any thoughts/plan of self harm and harm towards others. Fluid and snack offered. Patient states that appetite has been good throughout the day. Verbalizes no further complaints at this time. Will continue to monitor and support.

## 2022-02-16 NOTE — ED Provider Notes (Signed)
Behavioral Health Progress Note  Date and Time: 02/16/2022 9:09 PM Name: Teresa Murillo MRN:  [id number]   Teresa Murillo is a 61 y.o. female with a past psychiatric history of schizophrenia, aggressive behavior, and borderline intellectual disability presenting to [facility] on 11/23/21 voluntarily as a walk in via [facility] with complaints that she was locked out of the group home. Patient has been dismissed from group home due to multiple elopements. Pt had already been discharged from this facility but had been living there temporarily while DSS was looking for new placement. Pt is boarding at [facility].    Patient was assessed face-to-face by this provider and chart reviewed on 02/16/2022.  Subjective: On today's reassessment patient is observed walking around the unit.  She continues to be in good spirits.  She is interacting well with other patients.  She has remained calm and cooperative while on the unit.  She is denying any medical concerns.  She denies any depression or anxiety.  She denies SI/HI/AVH.  She is goal oriented with no distractibility and no evidence of internal/external preoccupation.  She continues to deny any concerns with appetite or sleep.  She is able to answer questions appropriately.  TOC and DSS continue to seek placement.  Diagnosis:  Final diagnoses:  At risk for self care deficit  Schizophrenia, unspecified type ([place])  Noncompliance with medication regimen  Borderline intellectual functioning    Total Time spent with patient: 30 minutes  Past Psychiatric History: See H&P Past Medical History:  Past Medical History:  Diagnosis Date   Borderline intellectual functioning 12/14/2021   On 12/14/2021: Appreciate assistance from psychology consult. On the Wechsler Adult Intelligence Scale-4, Ms. Handyside achieved a full-scale IQ score of 73 and a percentile rank of 4 placing her in the borderline range of intellectual functioning.    Chronic obstructive  pulmonary disease (COPD) (HCC)    Glaucoma    Hyperlipidemia    Hypertension    Iron deficiency    Schizoaffective disorder (HCC)    Type 2 diabetes mellitus (HCC)     Past Surgical History:  Procedure Laterality Date   TUBAL LIGATION     Family History:  Family History  Problem Relation Age of Onset   Breast cancer Maternal Grandmother    Family Psychiatric  History: See H&P Social History:  Social History   Substance and Sexual Activity  Alcohol Use Not Currently     Social History   Substance and Sexual Activity  Drug Use Not Currently    Social History   Socioeconomic History   Marital status: Divorced    Spouse name: Not on file   Number of children: Not on file   Years of education: Not on file   Highest education level: Not on file  Occupational History   Not on file  Tobacco Use   Smoking status: Former    Packs/day: 1.00    Types: Cigars, Cigarettes    Quit date: 11/09/2021    Years since quitting: 0.2   Smokeless tobacco: Current  Vaping Use   Vaping Use: Never used  Substance and Sexual Activity   Alcohol use: Not Currently   Drug use: Not Currently   Sexual activity: Not Currently    Birth control/protection: Surgical  Other Topics Concern   Not on file  Social History Narrative   Not on file   Social Determinants of Health   Financial Resource Strain: Not on file  Food Insecurity: Not on file  Transportation Needs: Not  on file  Physical Activity: Not on file  Stress: Not on file  Social Connections: Not on file   SDOH:  SDOH Screenings   Depression (PHQ2-9): Low Risk  (11/23/2021)  Tobacco Use: High Risk (02/08/2022)   Additional Social History:                         Sleep: Good  Appetite:  Good  Current Medications:  Current Facility-Administered Medications  Medication Dose Route Frequency Provider Last Rate Last Admin   acetaminophen (TYLENOL) tablet 650 mg  650 mg Oral Q6H PRN Onuoha, Chinwendu V, NP   650  mg at 02/16/22 0702   alum & mag hydroxide-simeth (MAALOX/MYLANTA) 200-200-20 MG/5ML suspension 30 mL  30 mL Oral Q4H PRN Onuoha, Chinwendu V, NP   30 mL at 02/14/22 1145   apixaban (ELIQUIS) tablet 5 mg  5 mg Oral BID Onuoha, Chinwendu V, NP   5 mg at 02/16/22 0801   ARIPiprazole (ABILIFY) tablet 10 mg  10 mg Oral Daily Onuoha, Chinwendu V, NP   10 mg at 02/16/22 0801   cloZAPine (CLOZARIL) tablet 75 mg  75 mg Oral Aliene Altes, MD   75 mg at 02/15/22 2111   cloZAPine (CLOZARIL) tablet 75 mg  75 mg Oral Daily France Ravens, MD   75 mg at 02/16/22 0801   colchicine tablet 0.6 mg  0.6 mg Oral QHS Onuoha, Chinwendu V, NP   0.6 mg at 02/15/22 2112   diltiazem (CARDIZEM CD) 24 hr capsule 240 mg  240 mg Oral Daily Onuoha, Chinwendu V, NP   240 mg at 02/16/22 0801   divalproex (DEPAKOTE ER) 24 hr tablet 500 mg  500 mg Oral BID Onuoha, Chinwendu V, NP   500 mg at 02/16/22 0801   ferrous sulfate tablet 325 mg  325 mg Oral Q breakfast Onuoha, Chinwendu V, NP   325 mg at 02/16/22 0801   haloperidol (HALDOL) tablet 5 mg  5 mg Oral TID PRN France Ravens, MD       hydrOXYzine (ATARAX) tablet 25 mg  25 mg Oral TID PRN Onuoha, Chinwendu V, NP   25 mg at 02/14/22 2135   insulin aspart (novoLOG) injection 0-5 Units  0-5 Units Subcutaneous QHS Onuoha, Chinwendu V, NP       insulin aspart (novoLOG) injection 0-9 Units  0-9 Units Subcutaneous TID WC Onuoha, Chinwendu V, NP   1 Units at 02/16/22 1221   latanoprost (XALATAN) 0.005 % ophthalmic solution 1 drop  1 drop Both Eyes QHS [place]ibola, Ene A, NP   1 drop at 02/15/22 2114   loperamide (IMODIUM) capsule 2 mg  2 mg Oral PRN White, Patrice L, NP       magnesium hydroxide (MILK OF MAGNESIA) suspension 30 mL  30 mL Oral Daily PRN Onuoha, Chinwendu V, NP       menthol-cetylpyridinium (CEPACOL) lozenge 3 mg  1 lozenge Oral PRN Onuoha, Chinwendu V, NP   3 mg at 02/16/22 2009   metFORMIN (GLUCOPHAGE) tablet 500 mg  500 mg Oral BID WC Onuoha, Chinwendu V, NP   500 mg at 02/16/22  1703   ondansetron (ZOFRAN-ODT) disintegrating tablet 4 mg  4 mg Oral Q8H PRN White, Patrice L, NP       sodium chloride (OCEAN) 0.65 % nasal spray 1 spray  1 spray Each Nare PRN France Ravens, MD       traZODone (DESYREL) tablet 50 mg  50 mg Oral QHS  PRN Cleatrice Burke, Chinwendu V, NP   50 mg at 02/14/22 2135   valbenazine (INGREZZA) capsule 40 mg  40 mg Oral q AM Onuoha, Chinwendu V, NP   40 mg at 02/16/22 0702   Vitamin D (Ergocalciferol) (DRISDOL) 1.25 MG (50000 UNIT) capsule 50,000 Units  50,000 Units Oral Q7 days Onuoha, Chinwendu V, NP   50,000 Units at 02/11/22 0902   Current Outpatient Medications  Medication Sig Dispense Refill   Accu-Chek Softclix Lancets lancets Use as directed up to four times daily 100 each 0   apixaban (ELIQUIS) 5 MG TABS tablet Take 1 tablet (5 mg total) by mouth 2 (two) times daily. 60 tablet 0   ARIPiprazole (ABILIFY) 10 MG tablet Take 1 tablet (10 mg total) by mouth daily. 30 tablet 0   Blood Glucose Monitoring Suppl (ACCU-CHEK GUIDE) w/Device KIT Use as directed up to four times daily 1 kit 0   budesonide-formoterol (SYMBICORT) 160-4.5 MCG/ACT inhaler Inhale 2 puffs into the lungs in the morning and at bedtime.     Cholecalciferol (VITAMIN D3) 1.25 MG (50000 UT) CAPS Take 50,000 Units by mouth every Thursday.     clozapine (CLOZARIL) 50 MG tablet Take 1 tablet (50 mg total) by mouth daily. 30 tablet 0   colchicine 0.6 MG tablet Take 1 tablet (0.6 mg total) by mouth at bedtime. 30 tablet 0   diltiazem (CARDIZEM CD) 240 MG 24 hr capsule Take 1 capsule (240 mg total) by mouth daily. (Patient not taking: Reported on 11/24/2021) 30 capsule 0   divalproex (DEPAKOTE ER) 500 MG 24 hr tablet Take 1 tablet (500 mg total) by mouth 2 (two) times daily. 60 tablet 0   glucose blood test strip Use as directed up to four times daily 50 each 0   haloperidol (HALDOL) 10 MG tablet Take 1 tablet (10 mg total) by mouth 3 (three) times daily as needed for agitation (and psychotic symptoms).      INGREZZA 40 MG capsule Take 1 capsule (40 mg total) by mouth in the morning. 30 capsule 0   insulin aspart (NOVOLOG) 100 UNIT/ML FlexPen Before each meal 3 times a day, 140-199 - 2 units, 200-250 - 4 units, 251-299 - 6 units,  300-349 - 8 units,  350 or above 10 units. Insulin PEN if approved, provide syringes and needles if needed.Please switch to any approved short acting Insulin if needed. 15 mL 0   insulin glargine (LANTUS) 100 UNIT/ML Solostar Pen Inject 12 Units into the skin daily. 15 mL 0   Insulin Pen Needle 32G X 4 MM MISC Use 4 times a day with insulin, 1 month supply. 100 each 0   latanoprost (XALATAN) 0.005 % ophthalmic solution Place 1 drop into both eyes at bedtime.     metFORMIN (GLUCOPHAGE) 500 MG tablet Take 500 mg by mouth 2 (two) times daily with a meal.     nicotine (NICODERM CQ - DOSED IN MG/24 HR) 7 mg/24hr patch Place 1 patch (7 mg total) onto the skin daily. 28 patch 0   PROAIR HFA 108 (90 Base) MCG/ACT inhaler Inhale 2 puffs into the lungs every 6 (six) hours as needed for wheezing or shortness of breath.      Labs  Lab Results:  Admission on 02/09/2022  Component Date Value Ref Range Status   Glucose-Capillary 02/09/2022 118 (H)  70 - 99 mg/dL Final   Glucose reference range applies only to samples taken after fasting for at least 8 hours.   Glucose-Capillary 02/10/2022  148 (H)  70 - 99 mg/dL Final   Glucose reference range applies only to samples taken after fasting for at least 8 hours.   Glucose-Capillary 02/10/2022 174 (H)  70 - 99 mg/dL Final   Glucose reference range applies only to samples taken after fasting for at least 8 hours.   Glucose-Capillary 02/10/2022 128 (H)  70 - 99 mg/dL Final   Glucose reference range applies only to samples taken after fasting for at least 8 hours.   Glucose-Capillary 02/10/2022 182 (H)  70 - 99 mg/dL Final   Glucose reference range applies only to samples taken after fasting for at least 8 hours.   Glucose-Capillary  02/11/2022 158 (H)  70 - 99 mg/dL Final   Glucose reference range applies only to samples taken after fasting for at least 8 hours.   Glucose-Capillary 02/11/2022 159 (H)  70 - 99 mg/dL Final   Glucose reference range applies only to samples taken after fasting for at least 8 hours.   Glucose-Capillary 02/11/2022 153 (H)  70 - 99 mg/dL Final   Glucose reference range applies only to samples taken after fasting for at least 8 hours.   Glucose-Capillary 02/11/2022 159 (H)  70 - 99 mg/dL Final   Glucose reference range applies only to samples taken after fasting for at least 8 hours.   Glucose-Capillary 02/11/2022 153 (H)  70 - 99 mg/dL Final   Glucose reference range applies only to samples taken after fasting for at least 8 hours.   Glucose-Capillary 02/11/2022 109 (H)  70 - 99 mg/dL Final   Glucose reference range applies only to samples taken after fasting for at least 8 hours.   Glucose-Capillary 02/11/2022 213 (H)  70 - 99 mg/dL Final   Glucose reference range applies only to samples taken after fasting for at least 8 hours.   Glucose-Capillary 02/11/2022 229 (H)  70 - 99 mg/dL Final   Glucose reference range applies only to samples taken after fasting for at least 8 hours.   Glucose-Capillary 02/12/2022 128 (H)  70 - 99 mg/dL Final   Glucose reference range applies only to samples taken after fasting for at least 8 hours.   Glucose-Capillary 02/12/2022 149 (H)  70 - 99 mg/dL Final   Glucose reference range applies only to samples taken after fasting for at least 8 hours.   Color, Urine 02/12/2022 YELLOW  YELLOW Final   APPearance 02/12/2022 CLEAR  CLEAR Final   Specific Gravity, Urine 02/12/2022 1.015  1.005 - 1.030 Final   pH 02/12/2022 5.0  5.0 - 8.0 Final   Glucose, UA 02/12/2022 NEGATIVE  NEGATIVE mg/dL Final   Hgb urine dipstick 02/12/2022 NEGATIVE  NEGATIVE Final   Bilirubin Urine 02/12/2022 NEGATIVE  NEGATIVE Final   Ketones, ur 02/12/2022 NEGATIVE  NEGATIVE mg/dL Final    Protein, ur 02/12/2022 NEGATIVE  NEGATIVE mg/dL Final   Nitrite 02/12/2022 NEGATIVE  NEGATIVE Final   Leukocytes,Ua 02/12/2022 NEGATIVE  NEGATIVE Final   Performed at [facility], [place] [address], [place], [place] [address]   Specimen Description 02/12/2022 URINE, CLEAN CATCH   Final   Special Requests 02/12/2022    Final                   Value:NONE Performed at [facility], [place] [address], [place], [place] [address]    Culture 02/12/2022 10,000 COLONIES/mL PROTEUS MIRABILIS (A)   Final   Report Status 02/12/2022 02/15/2022 FINAL   Final  Organism ID, Bacteria 02/12/2022 PROTEUS MIRABILIS (A)   Final   Glucose-Capillary 02/12/2022 128 (H)  70 - 99 mg/dL Final   Glucose reference range applies only to samples taken after fasting for at least 8 hours.   Glucose-Capillary 02/12/2022 196 (H)  70 - 99 mg/dL Final   Glucose reference range applies only to samples taken after fasting for at least 8 hours.   Glucose-Capillary 02/13/2022 168 (H)  70 - 99 mg/dL Final   Glucose reference range applies only to samples taken after fasting for at least 8 hours.   Glucose-Capillary 02/13/2022 153 (H)  70 - 99 mg/dL Final   Glucose reference range applies only to samples taken after fasting for at least 8 hours.   Glucose-Capillary 02/13/2022 134 (H)  70 - 99 mg/dL Final   Glucose reference range applies only to samples taken after fasting for at least 8 hours.   Glucose-Capillary 02/13/2022 178 (H)  70 - 99 mg/dL Final   Glucose reference range applies only to samples taken after fasting for at least 8 hours.   Glucose-Capillary 02/14/2022 156 (H)  70 - 99 mg/dL Final   Glucose reference range applies only to samples taken after fasting for at least 8 hours.   Glucose-Capillary 02/14/2022 169 (H)  70 - 99 mg/dL Final   Glucose reference range applies only to samples taken after fasting for at least 8 hours.   Glucose-Capillary 02/14/2022 156 (H)  70 - 99 mg/dL Final   Glucose reference  range applies only to samples taken after fasting for at least 8 hours.   Glucose-Capillary 02/14/2022 148 (H)  70 - 99 mg/dL Final   Glucose reference range applies only to samples taken after fasting for at least 8 hours.   Glucose-Capillary 02/15/2022 159 (H)  70 - 99 mg/dL Final   Glucose reference range applies only to samples taken after fasting for at least 8 hours.   Glucose-Capillary 02/15/2022 125 (H)  70 - 99 mg/dL Final   Glucose reference range applies only to samples taken after fasting for at least 8 hours.   Glucose-Capillary 02/15/2022 142 (H)  70 - 99 mg/dL Final   Glucose reference range applies only to samples taken after fasting for at least 8 hours.   Glucose-Capillary 02/15/2022 128 (H)  70 - 99 mg/dL Final   Glucose reference range applies only to samples taken after fasting for at least 8 hours.   Glucose-Capillary 02/16/2022 137 (H)  70 - 99 mg/dL Final   Glucose reference range applies only to samples taken after fasting for at least 8 hours.   WBC 02/16/2022 6.0  4.0 - 10.5 K/uL Final   RBC 02/16/2022 4.48  3.87 - 5.11 MIL/uL Final   Hemoglobin 02/16/2022 10.7 (L)  12.0 - 15.0 g/dL Final   HCT 02/16/2022 35.4 (L)  36.0 - 46.0 % Final   MCV 02/16/2022 79.0 (L)  80.0 - 100.0 fL Final   MCH 02/16/2022 23.9 (L)  26.0 - 34.0 pg Final   MCHC 02/16/2022 30.2  30.0 - 36.0 g/dL Final   RDW 02/16/2022 17.0 (H)  11.5 - 15.5 % Final   Platelets 02/16/2022 387  150 - 400 K/uL Final   nRBC 02/16/2022 0.0  0.0 - 0.2 % Final   Neutrophils Relative % 02/16/2022 54  % Final   Neutro Abs 02/16/2022 3.2  1.7 - 7.7 K/uL Final   Lymphocytes Relative 02/16/2022 35  % Final   Lymphs Abs 02/16/2022 2.1  0.7 -  4.0 K/uL Final   Monocytes Relative 02/16/2022 6  % Final   Monocytes Absolute 02/16/2022 0.4  0.1 - 1.0 K/uL Final   Eosinophils Relative 02/16/2022 2  % Final   Eosinophils Absolute 02/16/2022 0.1  0.0 - 0.5 K/uL Final   Basophils Relative 02/16/2022 2  % Final   Basophils  Absolute 02/16/2022 0.1  0.0 - 0.1 K/uL Final   Immature Granulocytes 02/16/2022 1  % Final   Abs Immature Granulocytes 02/16/2022 0.04  0.00 - 0.07 K/uL Final   Performed at [facility], [place] [address] [place], [place] [address]   Glucose-Capillary 02/16/2022 131 (H)  70 - 99 mg/dL Final   Glucose reference range applies only to samples taken after fasting for at least 8 hours.   Glucose-Capillary 02/16/2022 123 (H)  70 - 99 mg/dL Final   Glucose reference range applies only to samples taken after fasting for at least 8 hours.  Admission on 02/08/2022, Discharged on 02/09/2022  Component Date Value Ref Range Status   B Natriuretic Peptide 02/08/2022 40.2  0.0 - 100.0 pg/mL Final   Performed at [facility], [address], [place], [place] [address]   Troponin I (High Sensitivity) 02/08/2022 4  <18 ng/L Final   Comment: (NOTE) Elevated high sensitivity troponin I (hsTnI) values and significant  changes across serial measurements may suggest ACS but many other  chronic and acute conditions are known to elevate hsTnI results.  Refer to the "Links" section for chest pain algorithms and additional  guidance. Performed at [facility], [place] [address] [place], [place] [address]    WBC 02/08/2022 8.5  4.0 - 10.5 K/uL Final   RBC 02/08/2022 4.30  3.87 - 5.11 MIL/uL Final   Hemoglobin 02/08/2022 10.7 (L)  12.0 - 15.0 g/dL Final   HCT 02/08/2022 33.1 (L)  36.0 - 46.0 % Final   MCV 02/08/2022 77.0 (L)  80.0 - 100.0 fL Final   MCH 02/08/2022 24.9 (L)  26.0 - 34.0 pg Final   MCHC 02/08/2022 32.3  30.0 - 36.0 g/dL Final   RDW 02/08/2022 16.5 (H)  11.5 - 15.5 % Final   Platelets 02/08/2022 361  150 - 400 K/uL Final   nRBC 02/08/2022 0.0  0.0 - 0.2 % Final   Performed at [facility] [address], [place], [place] [address]   Sodium 02/08/2022 131 (L)  135 - 145 mmol/L Final   Potassium 02/08/2022 4.4  3.5 - 5.1 mmol/L Final   Chloride 02/08/2022 96 (L)  98 - 111  mmol/L Final   CO2 02/08/2022 25  22 - 32 mmol/L Final   Glucose, Bld 02/08/2022 126 (H)  70 - 99 mg/dL Final   Glucose reference range applies only to samples taken after fasting for at least 8 hours.   BUN 02/08/2022 11  6 - 20 mg/dL Final   Creatinine, Ser 02/08/2022 0.52  0.44 - 1.00 mg/dL Final   Calcium 02/08/2022 9.4  8.9 - 10.3 mg/dL Final   Total Protein 02/08/2022 7.5  6.5 - 8.1 g/dL Final   Albumin 02/08/2022 3.6  3.5 - 5.0 g/dL Final   AST 02/08/2022 22  15 - 41 U/L Final   ALT 02/08/2022 20  0 - 44 U/L Final   Alkaline Phosphatase 02/08/2022 67  38 - 126 U/L Final   Total Bilirubin 02/08/2022 0.1 (L)  0.3 - 1.2 mg/dL Final   GFR, Estimated 02/08/2022 >60  >60 mL/min Final  Comment: (NOTE) Calculated using the CKD-EPI Creatinine Equation (2021)    Anion gap 02/08/2022 10  5 - 15 Final   Performed at [facility], [place] [address] [place], [place] [address]   Troponin I (High Sensitivity) 02/08/2022 5  <18 ng/L Final   Comment: (NOTE) Elevated high sensitivity troponin I (hsTnI) values and significant  changes across serial measurements may suggest ACS but many other  chronic and acute conditions are known to elevate hsTnI results.  Refer to the "Links" section for chest pain algorithms and additional  guidance. Performed at [facility], [place] [address], [place], [place] [address]    WBC 02/09/2022 7.3  4.0 - 10.5 K/uL Final   RBC 02/09/2022 4.70  3.87 - 5.11 MIL/uL Final   Hemoglobin 02/09/2022 11.4 (L)  12.0 - 15.0 g/dL Final   HCT 02/09/2022 36.6  36.0 - 46.0 % Final   MCV 02/09/2022 77.9 (L)  80.0 - 100.0 fL Final   MCH 02/09/2022 24.3 (L)  26.0 - 34.0 pg Final   MCHC 02/09/2022 31.1  30.0 - 36.0 g/dL Final   RDW 02/09/2022 16.6 (H)  11.5 - 15.5 % Final   Platelets 02/09/2022 403 (H)  150 - 400 K/uL Final   nRBC 02/09/2022 0.0  0.0 - 0.2 % Final   Performed at [facility] [address] [place], [place] [address]   Glucose-Capillary  02/08/2022 117 (H)  70 - 99 mg/dL Final   Glucose reference range applies only to samples taken after fasting for at least 8 hours.   Sodium 02/09/2022 138  135 - 145 mmol/L Final   Potassium 02/09/2022 3.7  3.5 - 5.1 mmol/L Final   Chloride 02/09/2022 103  98 - 111 mmol/L Final   CO2 02/09/2022 24  22 - 32 mmol/L Final   Glucose, Bld 02/09/2022 134 (H)  70 - 99 mg/dL Final   Glucose reference range applies only to samples taken after fasting for at least 8 hours.   BUN 02/09/2022 9  6 - 20 mg/dL Final   Creatinine, Ser 02/09/2022 0.44  0.44 - 1.00 mg/dL Final   Calcium 02/09/2022 8.3 (L)  8.9 - 10.3 mg/dL Final   GFR, Estimated 02/09/2022 >60  >60 mL/min Final   Comment: (NOTE) Calculated using the CKD-EPI Creatinine Equation (2021)    Anion gap 02/09/2022 11  5 - 15 Final   Performed at [facility], [place] [address] [place], [place] [address]   Iron 02/09/2022 20 (L)  28 - 170 ug/dL Final   TIBC 02/09/2022 455 (H)  250 - 450 ug/dL Final   Saturation Ratios 02/09/2022 4 (L)  10.4 - 31.8 % Final   UIBC 02/09/2022 435  ug/dL Final   Performed at [facility], [place] [address] [place], [place] [address]   Ferritin 02/09/2022 2 (L)  11 - 307 ng/mL Final   Performed at [facility] [address], [place], [place] [address]   Weight 02/09/2022 2,846.58  oz Final   Height 02/09/2022 62  in Final   BP 02/09/2022 143/80  mmHg Final   WBC 02/09/2022 8.1  4.0 - 10.5 K/uL Final   RBC 02/09/2022 4.38  3.87 - 5.11 MIL/uL Final   Hemoglobin 02/09/2022 10.9 (L)  12.0 - 15.0 g/dL Final   HCT 02/09/2022 33.3 (L)  36.0 - 46.0 % Final   MCV 02/09/2022 76.0 (L)  80.0 - 100.0 fL Final   MCH  02/09/2022 24.9 (L)  26.0 - 34.0 pg Final   MCHC 02/09/2022 32.7  30.0 - 36.0 g/dL Final   RDW 02/09/2022 16.5 (H)  11.5 - 15.5 % Final   Platelets 02/09/2022 397  150 - 400 K/uL Final   nRBC 02/09/2022 0.0  0.0 - 0.2 % Final   Neutrophils Relative % 02/09/2022 66  % Final   Neutro Abs  02/09/2022 5.4  1.7 - 7.7 K/uL Final   Lymphocytes Relative 02/09/2022 24  % Final   Lymphs Abs 02/09/2022 1.9  0.7 - 4.0 K/uL Final   Monocytes Relative 02/09/2022 8  % Final   Monocytes Absolute 02/09/2022 0.6  0.1 - 1.0 K/uL Final   Eosinophils Relative 02/09/2022 1  % Final   Eosinophils Absolute 02/09/2022 0.1  0.0 - 0.5 K/uL Final   Basophils Relative 02/09/2022 1  % Final   Basophils Absolute 02/09/2022 0.1  0.0 - 0.1 K/uL Final   Immature Granulocytes 02/09/2022 0  % Final   Abs Immature Granulocytes 02/09/2022 0.03  0.00 - 0.07 K/uL Final   Performed at [facility], [place] [address], [place], [place] [address]   Glucose-Capillary 02/09/2022 238 (H)  70 - 99 mg/dL Final   Glucose reference range applies only to samples taken after fasting for at least 8 hours.   Glucose-Capillary 02/09/2022 91  70 - 99 mg/dL Final   Glucose reference range applies only to samples taken after fasting for at least 8 hours.   CRP 02/09/2022 <0.5  <1.0 mg/dL Final   Performed at [facility] [address], [place], [place] [address]   Sed Rate 02/09/2022 15  0 - 22 mm/hr Final   Performed at [facility] [address] [place], [place] [address]   Glucose-Capillary 02/09/2022 137 (H)  70 - 99 mg/dL Final   Glucose reference range applies only to samples taken after fasting for at least 8 hours.  Admission on 01/24/2022, Discharged on 02/08/2022  Component Date Value Ref Range Status   Glucose-Capillary 01/24/2022 143 (H)  70 - 99 mg/dL Final   Glucose reference range applies only to samples taken after fasting for at least 8 hours.   Glucose-Capillary 01/24/2022 120 (H)  70 - 99 mg/dL Final   Glucose reference range applies only to samples taken after fasting for at least 8 hours.   Glucose-Capillary 01/25/2022 122 (H)  70 - 99 mg/dL Final   Glucose reference range applies only to samples taken after fasting for at least 8 hours.   Glucose-Capillary 01/25/2022 190 (H)  70 - 99  mg/dL Final   Glucose reference range applies only to samples taken after fasting for at least 8 hours.   Glucose-Capillary 01/25/2022 163 (H)  70 - 99 mg/dL Final   Glucose reference range applies only to samples taken after fasting for at least 8 hours.   WBC 01/26/2022 6.5  4.0 - 10.5 K/uL Final   RBC 01/26/2022 4.53  3.87 - 5.11 MIL/uL Final   Hemoglobin 01/26/2022 11.3 (L)  12.0 - 15.0 g/dL Final   HCT 01/26/2022 36.4  36.0 - 46.0 % Final   MCV 01/26/2022 80.4  80.0 - 100.0 fL Final   MCH 01/26/2022 24.9 (L)  26.0 - 34.0 pg Final   MCHC 01/26/2022 31.0  30.0 - 36.0 g/dL Final   RDW 01/26/2022 17.3 (H)  11.5 - 15.5 % Final   Platelets 01/26/2022 327  150 - 400 K/uL Final   nRBC  01/26/2022 0.0  0.0 - 0.2 % Final   Neutrophils Relative % 01/26/2022 60  % Final   Neutro Abs 01/26/2022 3.9  1.7 - 7.7 K/uL Final   Lymphocytes Relative 01/26/2022 31  % Final   Lymphs Abs 01/26/2022 2.0  0.7 - 4.0 K/uL Final   Monocytes Relative 01/26/2022 7  % Final   Monocytes Absolute 01/26/2022 0.5  0.1 - 1.0 K/uL Final   Eosinophils Relative 01/26/2022 1  % Final   Eosinophils Absolute 01/26/2022 0.1  0.0 - 0.5 K/uL Final   Basophils Relative 01/26/2022 1  % Final   Basophils Absolute 01/26/2022 0.1  0.0 - 0.1 K/uL Final   Immature Granulocytes 01/26/2022 0  % Final   Abs Immature Granulocytes 01/26/2022 0.02  0.00 - 0.07 K/uL Final   Performed at [facility], [place] [address], [place], [place] [address]   Glucose-Capillary 01/26/2022 149 (H)  70 - 99 mg/dL Final   Glucose reference range applies only to samples taken after fasting for at least 8 hours.   Glucose-Capillary 01/26/2022 130 (H)  70 - 99 mg/dL Final   Glucose reference range applies only to samples taken after fasting for at least 8 hours.   Glucose-Capillary 01/26/2022 151 (H)  70 - 99 mg/dL Final   Glucose reference range applies only to samples taken after fasting for at least 8 hours.   Glucose-Capillary 01/26/2022 170 (H)  70  - 99 mg/dL Final   Glucose reference range applies only to samples taken after fasting for at least 8 hours.   Glucose-Capillary 01/27/2022 129 (H)  70 - 99 mg/dL Final   Glucose reference range applies only to samples taken after fasting for at least 8 hours.   Glucose-Capillary 01/27/2022 101 (H)  70 - 99 mg/dL Final   Glucose reference range applies only to samples taken after fasting for at least 8 hours.   Glucose-Capillary 01/27/2022 220 (H)  70 - 99 mg/dL Final   Glucose reference range applies only to samples taken after fasting for at least 8 hours.   Glucose-Capillary 01/27/2022 154 (H)  70 - 99 mg/dL Final   Glucose reference range applies only to samples taken after fasting for at least 8 hours.   Glucose-Capillary 01/27/2022 163 (H)  70 - 99 mg/dL Final   Glucose reference range applies only to samples taken after fasting for at least 8 hours.   Glucose-Capillary 01/28/2022 127 (H)  70 - 99 mg/dL Final   Glucose reference range applies only to samples taken after fasting for at least 8 hours.   Glucose-Capillary 01/28/2022 110 (H)  70 - 99 mg/dL Final   Glucose reference range applies only to samples taken after fasting for at least 8 hours.   Glucose-Capillary 01/28/2022 167 (H)  70 - 99 mg/dL Final   Glucose reference range applies only to samples taken after fasting for at least 8 hours.   Glucose-Capillary 01/29/2022 145 (H)  70 - 99 mg/dL Final   Glucose reference range applies only to samples taken after fasting for at least 8 hours.   Glucose-Capillary 01/29/2022 203 (H)  70 - 99 mg/dL Final   Glucose reference range applies only to samples taken after fasting for at least 8 hours.   Glucose-Capillary 01/29/2022 153 (H)  70 - 99 mg/dL Final   Glucose reference range applies only to samples taken after fasting for at least 8 hours.   Glucose-Capillary 01/29/2022 166 (H)  70 - 99 mg/dL Final  Glucose reference range applies only to samples taken after fasting for at least  8 hours.   Glucose-Capillary 01/30/2022 142 (H)  70 - 99 mg/dL Final   Glucose reference range applies only to samples taken after fasting for at least 8 hours.   Glucose-Capillary 01/30/2022 138 (H)  70 - 99 mg/dL Final   Glucose reference range applies only to samples taken after fasting for at least 8 hours.   Glucose-Capillary 01/30/2022 185 (H)  70 - 99 mg/dL Final   Glucose reference range applies only to samples taken after fasting for at least 8 hours.   Glucose-Capillary 01/30/2022 220 (H)  70 - 99 mg/dL Final   Glucose reference range applies only to samples taken after fasting for at least 8 hours.   Glucose-Capillary 01/31/2022 147 (H)  70 - 99 mg/dL Final   Glucose reference range applies only to samples taken after fasting for at least 8 hours.   Glucose-Capillary 01/31/2022 131 (H)  70 - 99 mg/dL Final   Glucose reference range applies only to samples taken after fasting for at least 8 hours.   Glucose-Capillary 01/31/2022 169 (H)  70 - 99 mg/dL Final   Glucose reference range applies only to samples taken after fasting for at least 8 hours.   Glucose-Capillary 01/31/2022 144 (H)  70 - 99 mg/dL Final   Glucose reference range applies only to samples taken after fasting for at least 8 hours.   Comment 1 01/31/2022 RN AWARE   Final   Glucose-Capillary 02/01/2022 117 (H)  70 - 99 mg/dL Final   Glucose reference range applies only to samples taken after fasting for at least 8 hours.   Glucose-Capillary 02/01/2022 180 (H)  70 - 99 mg/dL Final   Glucose reference range applies only to samples taken after fasting for at least 8 hours.   Glucose-Capillary 02/01/2022 209 (H)  70 - 99 mg/dL Final   Glucose reference range applies only to samples taken after fasting for at least 8 hours.   Glucose-Capillary 02/02/2022 131 (H)  70 - 99 mg/dL Final   Glucose reference range applies only to samples taken after fasting for at least 8 hours.   WBC 02/02/2022 7.5  4.0 - 10.5 K/uL Final   RBC  02/02/2022 4.52  3.87 - 5.11 MIL/uL Final   Hemoglobin 02/02/2022 11.3 (L)  12.0 - 15.0 g/dL Final   HCT 02/02/2022 35.0 (L)  36.0 - 46.0 % Final   MCV 02/02/2022 77.4 (L)  80.0 - 100.0 fL Final   MCH 02/02/2022 25.0 (L)  26.0 - 34.0 pg Final   MCHC 02/02/2022 32.3  30.0 - 36.0 g/dL Final   RDW 02/02/2022 16.7 (H)  11.5 - 15.5 % Final   Platelets 02/02/2022 345  150 - 400 K/uL Final   nRBC 02/02/2022 0.0  0.0 - 0.2 % Final   Neutrophils Relative % 02/02/2022 63  % Final   Neutro Abs 02/02/2022 4.7  1.7 - 7.7 K/uL Final   Lymphocytes Relative 02/02/2022 28  % Final   Lymphs Abs 02/02/2022 2.1  0.7 - 4.0 K/uL Final   Monocytes Relative 02/02/2022 7  % Final   Monocytes Absolute 02/02/2022 0.5  0.1 - 1.0 K/uL Final   Eosinophils Relative 02/02/2022 1  % Final   Eosinophils Absolute 02/02/2022 0.1  0.0 - 0.5 K/uL Final   Basophils Relative 02/02/2022 1  % Final   Basophils Absolute 02/02/2022 0.1  0.0 - 0.1 K/uL Final   Immature Granulocytes  02/02/2022 0  % Final   Abs Immature Granulocytes 02/02/2022 0.03  0.00 - 0.07 K/uL Final   Performed at [facility], [place] [address] [place], [place] [address]   Glucose-Capillary 02/02/2022 95  70 - 99 mg/dL Final   Glucose reference range applies only to samples taken after fasting for at least 8 hours.   Glucose-Capillary 02/02/2022 120 (H)  70 - 99 mg/dL Final   Glucose reference range applies only to samples taken after fasting for at least 8 hours.   Glucose-Capillary 02/02/2022 191 (H)  70 - 99 mg/dL Final   Glucose reference range applies only to samples taken after fasting for at least 8 hours.   Glucose-Capillary 02/03/2022 148 (H)  70 - 99 mg/dL Final   Glucose reference range applies only to samples taken after fasting for at least 8 hours.   Glucose-Capillary 02/03/2022 122 (H)  70 - 99 mg/dL Final   Glucose reference range applies only to samples taken after fasting for at least 8 hours.   Glucose-Capillary 02/03/2022 233 (H)  70  - 99 mg/dL Final   Glucose reference range applies only to samples taken after fasting for at least 8 hours.   Glucose-Capillary 02/04/2022 126 (H)  70 - 99 mg/dL Final   Glucose reference range applies only to samples taken after fasting for at least 8 hours.   Glucose-Capillary 02/04/2022 117 (H)  70 - 99 mg/dL Final   Glucose reference range applies only to samples taken after fasting for at least 8 hours.   Glucose-Capillary 02/04/2022 135 (H)  70 - 99 mg/dL Final   Glucose reference range applies only to samples taken after fasting for at least 8 hours.   Glucose-Capillary 02/04/2022 197 (H)  70 - 99 mg/dL Final   Glucose reference range applies only to samples taken after fasting for at least 8 hours.   Glucose-Capillary 02/05/2022 170 (H)  70 - 99 mg/dL Final   Glucose reference range applies only to samples taken after fasting for at least 8 hours.   Glucose-Capillary 02/05/2022 107 (H)  70 - 99 mg/dL Final   Glucose reference range applies only to samples taken after fasting for at least 8 hours.   Glucose-Capillary 02/05/2022 184 (H)  70 - 99 mg/dL Final   Glucose reference range applies only to samples taken after fasting for at least 8 hours.   Glucose-Capillary 02/05/2022 256 (H)  70 - 99 mg/dL Final   Glucose reference range applies only to samples taken after fasting for at least 8 hours.   Glucose-Capillary 02/06/2022 144 (H)  70 - 99 mg/dL Final   Glucose reference range applies only to samples taken after fasting for at least 8 hours.   Glucose-Capillary 02/06/2022 190 (H)  70 - 99 mg/dL Final   Glucose reference range applies only to samples taken after fasting for at least 8 hours.   Glucose-Capillary 02/06/2022 92  70 - 99 mg/dL Final   Glucose reference range applies only to samples taken after fasting for at least 8 hours.   Trichomonas 02/06/2022 Negative   Final   Bacterial Vaginitis-Urine 02/06/2022 Negative   Final   Molecular Comment 02/06/2022 For tests bacteria  and/or candida, this specimen does not meet the   Final   Molecular Comment 02/06/2022 strict criteria set by the FDA. The result interpretation should be   Final   Molecular Comment 02/06/2022 considered in conjunction with the patient's clinical history.   Final   Comment  02/06/2022 Normal Reference Range Trichomonas - Negative   Final   Glucose-Capillary 02/06/2022 197 (H)  70 - 99 mg/dL Final   Glucose reference range applies only to samples taken after fasting for at least 8 hours.   Glucose-Capillary 02/07/2022 129 (H)  70 - 99 mg/dL Final   Glucose reference range applies only to samples taken after fasting for at least 8 hours.   Glucose-Capillary 02/07/2022 207 (H)  70 - 99 mg/dL Final   Glucose reference range applies only to samples taken after fasting for at least 8 hours.   Glucose-Capillary 02/07/2022 153 (H)  70 - 99 mg/dL Final   Glucose reference range applies only to samples taken after fasting for at least 8 hours.   Glucose-Capillary 02/08/2022 129 (H)  70 - 99 mg/dL Final   Glucose reference range applies only to samples taken after fasting for at least 8 hours.   Glucose-Capillary 02/08/2022 107 (H)  70 - 99 mg/dL Final   Glucose reference range applies only to samples taken after fasting for at least 8 hours.  Admission on 01/23/2022, Discharged on 01/24/2022  Component Date Value Ref Range Status   Sodium 01/23/2022 140  135 - 145 mmol/L Final   Potassium 01/23/2022 4.4  3.5 - 5.1 mmol/L Final   Chloride 01/23/2022 101  98 - 111 mmol/L Final   CO2 01/23/2022 25  22 - 32 mmol/L Final   Glucose, Bld 01/23/2022 198 (H)  70 - 99 mg/dL Final   Glucose reference range applies only to samples taken after fasting for at least 8 hours.   BUN 01/23/2022 13  6 - 20 mg/dL Final   Creatinine, Ser 01/23/2022 0.62  0.44 - 1.00 mg/dL Final   Calcium 01/23/2022 9.3  8.9 - 10.3 mg/dL Final   GFR, Estimated 01/23/2022 >60  >60 mL/min Final   Comment: (NOTE) Calculated using the  CKD-EPI Creatinine Equation (2021)    Anion gap 01/23/2022 14  5 - 15 Final   Performed at [facility], [place] [address], [place], [place] [address]   WBC 01/23/2022 5.8  4.0 - 10.5 K/uL Final   RBC 01/23/2022 4.63  3.87 - 5.11 MIL/uL Final   Hemoglobin 01/23/2022 11.7 (L)  12.0 - 15.0 g/dL Final   HCT 01/23/2022 37.4  36.0 - 46.0 % Final   MCV 01/23/2022 80.8  80.0 - 100.0 fL Final   MCH 01/23/2022 25.3 (L)  26.0 - 34.0 pg Final   MCHC 01/23/2022 31.3  30.0 - 36.0 g/dL Final   RDW 01/23/2022 17.9 (H)  11.5 - 15.5 % Final   Platelets 01/23/2022 333  150 - 400 K/uL Final   nRBC 01/23/2022 0.0  0.0 - 0.2 % Final   Performed at [place] [address] [place], [place] [address]   Troponin I (High Sensitivity) 01/23/2022 6  <18 ng/L Final   Comment: (NOTE) Elevated high sensitivity troponin I (hsTnI) values and significant  changes across serial measurements may suggest ACS but many other  chronic and acute conditions are known to elevate hsTnI results.  Refer to the "Links" section for chest pain algorithms and additional  guidance. Performed at [facility], [place] [address] [place], [place] [address]    Troponin I (High Sensitivity) 01/23/2022 5  <18 ng/L Final   Comment: (NOTE) Elevated high sensitivity troponin I (hsTnI) values and significant  changes across serial measurements may suggest ACS but many other  chronic and acute conditions are  known to elevate hsTnI results.  Refer to the "Links" section for chest pain algorithms and additional  guidance. Performed at [facility], [place] [address] [place], [place] [address]    SARS Coronavirus 2 by RT PCR 01/23/2022 NEGATIVE  NEGATIVE Final   Comment: (NOTE) SARS-CoV-2 target nucleic acids are NOT DETECTED.  The SARS-CoV-2 RNA is generally detectable in upper and lower respiratory specimens during the acute phase of infection. The lowest concentration of SARS-CoV-2 viral copies this assay can  detect is 250 copies / mL. A negative result does not preclude SARS-CoV-2 infection and should not be used as the sole basis for treatment or other patient management decisions.  A negative result may occur with improper specimen collection / handling, submission of specimen other than nasopharyngeal swab, presence of viral mutation(s) within the areas targeted by this assay, and inadequate number of viral copies (<250 copies / mL). A negative result must be combined with clinical observations, patient history, and epidemiological information.  Fact Sheet for Patients:   https://www.patel.info/  Fact Sheet for Healthcare Providers: https://hall.com/  This test is not yet approved or                           cleared by the [place] FDA and has been authorized for detection and/or diagnosis of SARS-CoV-2 by FDA under an Emergency Use Authorization (EUA).  This EUA will remain in effect (meaning this test can be used) for the duration of the COVID-19 declaration under Section 564(b)(1) of the Act, 21 U.S.C. section 360bbb-3(b)(1), unless the authorization is terminated or revoked sooner.  Performed at [facility], [place] [address] [place], [place] [address]    B Natriuretic Peptide 01/23/2022 41.3  0.0 - 100.0 pg/mL Final   Performed at [place] [address], [place], [place] [address]   Group A Strep by PCR 01/23/2022 NOT DETECTED  NOT DETECTED Final   Performed at [facility], [place] [address] [place], [place] [address]   Color, Urine 01/23/2022 YELLOW  YELLOW Final   APPearance 01/23/2022 CLEAR  CLEAR Final   Specific Gravity, Urine 01/23/2022 1.018  1.005 - 1.030 Final   pH 01/23/2022 7.0  5.0 - 8.0 Final   Glucose, UA 01/23/2022 NEGATIVE  NEGATIVE mg/dL Final   Hgb urine dipstick 01/23/2022 NEGATIVE  NEGATIVE Final   Bilirubin Urine 01/23/2022 NEGATIVE  NEGATIVE Final   Ketones, ur 01/23/2022 NEGATIVE   NEGATIVE mg/dL Final   Protein, ur 01/23/2022 NEGATIVE  NEGATIVE mg/dL Final   Nitrite 01/23/2022 NEGATIVE  NEGATIVE Final   Leukocytes,Ua 01/23/2022 TRACE (A)  NEGATIVE Final   RBC / HPF 01/23/2022 0-5  0 - 5 RBC/hpf Final   WBC, UA 01/23/2022 0-5  0 - 5 WBC/hpf Final   Bacteria, UA 01/23/2022 NONE SEEN  NONE SEEN Final   Squamous Epithelial / LPF 01/23/2022 0-5  0 - 5 Final   Mucus 01/23/2022 PRESENT   Final   Performed at [facility], [place] [address] [place], [place] [address]   SARS Coronavirus 2 by RT PCR 01/23/2022 NEGATIVE  NEGATIVE Final   Comment: (NOTE) SARS-CoV-2 target nucleic acids are NOT DETECTED.  The SARS-CoV-2 RNA is generally detectable in upper respiratory specimens during the acute phase of infection. The lowest concentration of SARS-CoV-2 viral copies this assay can detect is 138 copies/mL. A negative result does not preclude SARS-Cov-2 infection and should not be used  as the sole basis for treatment or other patient management decisions. A negative result may occur with  improper specimen collection/handling, submission of specimen other than nasopharyngeal swab, presence of viral mutation(s) within the areas targeted by this assay, and inadequate number of viral copies(<138 copies/mL). A negative result must be combined with clinical observations, patient history, and epidemiological information. The expected result is Negative.  Fact Sheet for Patients:  EntrepreneurPulse.com.au  Fact Sheet for Healthcare Providers:  IncredibleEmployment.be  This test is no                          t yet approved or cleared by the [place] FDA and  has been authorized for detection and/or diagnosis of SARS-CoV-2 by FDA under an Emergency Use Authorization (EUA). This EUA will remain  in effect (meaning this test can be used) for the duration of the COVID-19 declaration under Section 564(b)(1) of the Act, 21 U.S.C.section  360bbb-3(b)(1), unless the authorization is terminated  or revoked sooner.       Influenza A by PCR 01/23/2022 NEGATIVE  NEGATIVE Final   Influenza B by PCR 01/23/2022 NEGATIVE  NEGATIVE Final   Comment: (NOTE) The Xpert Xpress SARS-CoV-2/FLU/RSV plus assay is intended as an aid in the diagnosis of influenza from Nasopharyngeal swab specimens and should not be used as a sole basis for treatment. Nasal washings and aspirates are unacceptable for Xpert Xpress SARS-CoV-2/FLU/RSV testing.  Fact Sheet for Patients: EntrepreneurPulse.com.au  Fact Sheet for Healthcare Providers: IncredibleEmployment.be  This test is not yet approved or cleared by the [place] FDA and has been authorized for detection and/or diagnosis of SARS-CoV-2 by FDA under an Emergency Use Authorization (EUA). This EUA will remain in effect (meaning this test can be used) for the duration of the COVID-19 declaration under Section 564(b)(1) of the Act, 21 U.S.C. section 360bbb-3(b)(1), unless the authorization is terminated or revoked.  Performed at [facility], [place] [address] [place], [place] [address]    WBC 01/24/2022 6.1  4.0 - 10.5 K/uL Final   RBC 01/24/2022 4.60  3.87 - 5.11 MIL/uL Final   Hemoglobin 01/24/2022 11.7 (L)  12.0 - 15.0 g/dL Final   HCT 01/24/2022 37.0  36.0 - 46.0 % Final   MCV 01/24/2022 80.4  80.0 - 100.0 fL Final   MCH 01/24/2022 25.4 (L)  26.0 - 34.0 pg Final   MCHC 01/24/2022 31.6  30.0 - 36.0 g/dL Final   RDW 01/24/2022 17.6 (H)  11.5 - 15.5 % Final   Platelets 01/24/2022 334  150 - 400 K/uL Final   nRBC 01/24/2022 0.0  0.0 - 0.2 % Final   Neutrophils Relative % 01/24/2022 53  % Final   Neutro Abs 01/24/2022 3.3  1.7 - 7.7 K/uL Final   Lymphocytes Relative 01/24/2022 33  % Final   Lymphs Abs 01/24/2022 2.0  0.7 - 4.0 K/uL Final   Monocytes Relative 01/24/2022 11  % Final   Monocytes Absolute 01/24/2022 0.7  0.1 - 1.0 K/uL Final    Eosinophils Relative 01/24/2022 2  % Final   Eosinophils Absolute 01/24/2022 0.1  0.0 - 0.5 K/uL Final   Basophils Relative 01/24/2022 1  % Final   Basophils Absolute 01/24/2022 0.1  0.0 - 0.1 K/uL Final   Immature Granulocytes 01/24/2022 0  % Final   Abs Immature Granulocytes 01/24/2022 0.02  0.00 - 0.07 K/uL Final   Performed at [facility], [place] [address]  [place], [place] [address]   Glucose-Capillary 01/24/2022 110 (H)  70 - 99 mg/dL Final   Glucose reference range applies only to samples taken after fasting for at least 8 hours.  No results displayed because visit has over 200 results.    Admission on 11/22/2021, Discharged on 11/22/2021  Component Date Value Ref Range Status   Glucose-Capillary 11/22/2021 169 (H)  70 - 99 mg/dL Final   Glucose reference range applies only to samples taken after fasting for at least 8 hours.  No results displayed because visit has over 200 results.    Admission on 10/04/2021, Discharged on 10/22/2021  Component Date Value Ref Range Status   SARS Coronavirus 2 by RT PCR 10/04/2021 NEGATIVE  NEGATIVE Final   Comment: (NOTE) SARS-CoV-2 target nucleic acids are NOT DETECTED.  The SARS-CoV-2 RNA is generally detectable in upper respiratory specimens during the acute phase of infection. The lowest concentration of SARS-CoV-2 viral copies this assay can detect is 138 copies/mL. A negative result does not preclude SARS-Cov-2 infection and should not be used as the sole basis for treatment or other patient management decisions. A negative result may occur with  improper specimen collection/handling, submission of specimen other than nasopharyngeal swab, presence of viral mutation(s) within the areas targeted by this assay, and inadequate number of viral copies(<138 copies/mL). A negative result must be combined with clinical observations, patient history, and epidemiological information. The expected result is Negative.  Fact Sheet for  Patients:  EntrepreneurPulse.com.au  Fact Sheet for Healthcare Providers:  IncredibleEmployment.be  This test is no                          t yet approved or cleared by the [place] FDA and  has been authorized for detection and/or diagnosis of SARS-CoV-2 by FDA under an Emergency Use Authorization (EUA). This EUA will remain  in effect (meaning this test can be used) for the duration of the COVID-19 declaration under Section 564(b)(1) of the Act, 21 U.S.C.section 360bbb-3(b)(1), unless the authorization is terminated  or revoked sooner.       Influenza A by PCR 10/04/2021 NEGATIVE  NEGATIVE Final   Influenza B by PCR 10/04/2021 NEGATIVE  NEGATIVE Final   Comment: (NOTE) The Xpert Xpress SARS-CoV-2/FLU/RSV plus assay is intended as an aid in the diagnosis of influenza from Nasopharyngeal swab specimens and should not be used as a sole basis for treatment. Nasal washings and aspirates are unacceptable for Xpert Xpress SARS-CoV-2/FLU/RSV testing.  Fact Sheet for Patients: EntrepreneurPulse.com.au  Fact Sheet for Healthcare Providers: IncredibleEmployment.be  This test is not yet approved or cleared by the [place] FDA and has been authorized for detection and/or diagnosis of SARS-CoV-2 by FDA under an Emergency Use Authorization (EUA). This EUA will remain in effect (meaning this test can be used) for the duration of the COVID-19 declaration under Section 564(b)(1) of the Act, 21 U.S.C. section 360bbb-3(b)(1), unless the authorization is terminated or revoked.  Performed at [facility], [place] [address] [place], [place] [address]    WBC 10/04/2021 8.4  4.0 - 10.5 K/uL Final   RBC 10/04/2021 4.42  3.87 - 5.11 MIL/uL Final   Hemoglobin 10/04/2021 11.6 (L)  12.0 - 15.0 g/dL Final   HCT 10/04/2021 35.7 (L)  36.0 - 46.0 % Final   MCV 10/04/2021 80.8  80.0 - 100.0 fL Final   MCH 10/04/2021  26.2  26.0 - 34.0 pg Final   MCHC 10/04/2021  32.5  30.0 - 36.0 g/dL Final   RDW 10/04/2021 16.0 (H)  11.5 - 15.5 % Final   Platelets 10/04/2021 372  150 - 400 K/uL Final   nRBC 10/04/2021 0.0  0.0 - 0.2 % Final   Neutrophils Relative % 10/04/2021 68  % Final   Neutro Abs 10/04/2021 5.7  1.7 - 7.7 K/uL Final   Lymphocytes Relative 10/04/2021 22  % Final   Lymphs Abs 10/04/2021 1.8  0.7 - 4.0 K/uL Final   Monocytes Relative 10/04/2021 8  % Final   Monocytes Absolute 10/04/2021 0.7  0.1 - 1.0 K/uL Final   Eosinophils Relative 10/04/2021 1  % Final   Eosinophils Absolute 10/04/2021 0.1  0.0 - 0.5 K/uL Final   Basophils Relative 10/04/2021 1  % Final   Basophils Absolute 10/04/2021 0.1  0.0 - 0.1 K/uL Final   Immature Granulocytes 10/04/2021 0  % Final   Abs Immature Granulocytes 10/04/2021 0.03  0.00 - 0.07 K/uL Final   Performed at [facility], [place] [address] [place], [place] [address]   Sodium 10/04/2021 136  135 - 145 mmol/L Final   Potassium 10/04/2021 4.2  3.5 - 5.1 mmol/L Final   Chloride 10/04/2021 104  98 - 111 mmol/L Final   CO2 10/04/2021 25  22 - 32 mmol/L Final   Glucose, Bld 10/04/2021 100 (H)  70 - 99 mg/dL Final   Glucose reference range applies only to samples taken after fasting for at least 8 hours.   BUN 10/04/2021 9  6 - 20 mg/dL Final   Creatinine, Ser 10/04/2021 0.52  0.44 - 1.00 mg/dL Final   Calcium 10/04/2021 9.0  8.9 - 10.3 mg/dL Final   Total Protein 10/04/2021 7.0  6.5 - 8.1 g/dL Final   Albumin 10/04/2021 3.2 (L)  3.5 - 5.0 g/dL Final   AST 10/04/2021 13 (L)  15 - 41 U/L Final   ALT 10/04/2021 10  0 - 44 U/L Final   Alkaline Phosphatase 10/04/2021 61  38 - 126 U/L Final   Total Bilirubin 10/04/2021 0.3  0.3 - 1.2 mg/dL Final   GFR, Estimated 10/04/2021 >60  >60 mL/min Final   Comment: (NOTE) Calculated using the CKD-EPI Creatinine Equation (2021)    Anion gap 10/04/2021 7  5 - 15 Final   Performed at [facility] [address],  [place], [place] [address]   Hgb A1c MFr Bld 10/04/2021 7.0 (H)  4.8 - 5.6 % Final   Comment: (NOTE) Pre diabetes:          5.7%-6.4%  Diabetes:              >6.4%  Glycemic control for   <7.0% adults with diabetes    Mean Plasma Glucose 10/04/2021 154.2  mg/dL Final   Performed at [facility], [place] [address] [place], [place] [address]   Cholesterol 10/04/2021 178  0 - 200 mg/dL Final   Triglycerides 10/04/2021 155 (H)  <150 mg/dL Final   HDL 10/04/2021 52  >40 mg/dL Final   Total CHOL/HDL Ratio 10/04/2021 3.4  RATIO Final   VLDL 10/04/2021 31  0 - 40 mg/dL Final   LDL Cholesterol 10/04/2021 95  0 - 99 mg/dL Final   Comment:        Total Cholesterol/HDL:CHD Risk Coronary Heart Disease Risk Table                     Men   Women  1/2 Average Risk   3.4   3.3  Average Risk       5.0   4.4  2 X Average Risk   9.6   7.1  3 X Average Risk  23.4   11.0        Use the calculated Patient Ratio above and the CHD Risk Table to determine the patient's CHD Risk.        ATP III CLASSIFICATION (LDL):  <100     mg/dL   Optimal  100-129  mg/dL   Near or Above                    Optimal  130-159  mg/dL   Borderline  160-189  mg/dL   High  >190     mg/dL   Very High Performed at [facility] [address], [place], [place] [address]    POC Amphetamine UR 10/04/2021 None Detected  NONE DETECTED (Cut Off Level 1000 ng/mL) Final   POC Secobarbital (BAR) 10/04/2021 None Detected  NONE DETECTED (Cut Off Level 300 ng/mL) Final   POC Buprenorphine (BUP) 10/04/2021 None Detected  NONE DETECTED (Cut Off Level 10 ng/mL) Final   POC Oxazepam (BZO) 10/04/2021 None Detected  NONE DETECTED (Cut Off Level 300 ng/mL) Final   POC Cocaine UR 10/04/2021 None Detected  NONE DETECTED (Cut Off Level 300 ng/mL) Final   POC Methamphetamine UR 10/04/2021 None Detected  NONE DETECTED (Cut Off Level 1000 ng/mL) Final   POC Morphine 10/04/2021 None Detected  NONE DETECTED (Cut Off Level 300 ng/mL) Final    POC Methadone UR 10/04/2021 None Detected  NONE DETECTED (Cut Off Level 300 ng/mL) Final   POC Oxycodone UR 10/04/2021 Positive (A)  NONE DETECTED (Cut Off Level 100 ng/mL) Final   POC Marijuana UR 10/04/2021 None Detected  NONE DETECTED (Cut Off Level 50 ng/mL) Final   SARSCOV2ONAVIRUS 2 AG 10/04/2021 NEGATIVE  NEGATIVE Final   Comment: (NOTE) SARS-CoV-2 antigen NOT DETECTED.   Negative results are presumptive.  Negative results do not preclude SARS-CoV-2 infection and should not be used as the sole basis for treatment or other patient management decisions, including infection  control decisions, particularly in the presence of clinical signs and  symptoms consistent with COVID-19, or in those who have been in contact with the virus.  Negative results must be combined with clinical observations, patient history, and epidemiological information. The expected result is Negative.  Fact Sheet for Patients: HandmadeRecipes.com.cy  Fact Sheet for Healthcare Providers: FuneralLife.at  This test is not yet approved or cleared by the [place] FDA and  has been authorized for detection and/or diagnosis of SARS-CoV-2 by FDA under an Emergency Use Authorization (EUA).  This EUA will remain in effect (meaning this test can be used) for the duration of  the COV                          ID-19 declaration under Section 564(b)(1) of the Act, 21 U.S.C. section 360bbb-3(b)(1), unless the authorization is terminated or revoked sooner.     Valproic Acid Lvl 10/04/2021 51  50.0 - 100.0 ug/mL Final   Performed at [facility] [address], [place], [place] [address]   Valproic Acid Lvl 10/08/2021 57  50.0 - 100.0 ug/mL Final   Performed at [facility] [address], [place], [place] [address]   Glucose-Capillary 10/09/2021 123 (H)  70 -  99 mg/dL Final   Glucose reference range applies only to samples taken after fasting for at least 8  hours.  Admission on 09/09/2021, Discharged on 09/10/2021  Component Date Value Ref Range Status   Sodium 09/09/2021 134 (L)  135 - 145 mmol/L Final   Potassium 09/09/2021 4.3  3.5 - 5.1 mmol/L Final   Chloride 09/09/2021 99  98 - 111 mmol/L Final   CO2 09/09/2021 25  22 - 32 mmol/L Final   Glucose, Bld 09/09/2021 107 (H)  70 - 99 mg/dL Final   Glucose reference range applies only to samples taken after fasting for at least 8 hours.   BUN 09/09/2021 12  6 - 20 mg/dL Final   Creatinine, Ser 09/09/2021 0.74  0.44 - 1.00 mg/dL Final   Calcium 09/09/2021 9.0  8.9 - 10.3 mg/dL Final   Total Protein 09/09/2021 7.4  6.5 - 8.1 g/dL Final   Albumin 09/09/2021 3.1 (L)  3.5 - 5.0 g/dL Final   AST 09/09/2021 13 (L)  15 - 41 U/L Final   ALT 09/09/2021 13  0 - 44 U/L Final   Alkaline Phosphatase 09/09/2021 66  38 - 126 U/L Final   Total Bilirubin 09/09/2021 0.2 (L)  0.3 - 1.2 mg/dL Final   GFR, Estimated 09/09/2021 >60  >60 mL/min Final   Comment: (NOTE) Calculated using the CKD-EPI Creatinine Equation (2021)    Anion gap 09/09/2021 10  5 - 15 Final   Performed at [facility], [place] [address], [place], [place] [address]   WBC 09/09/2021 8.5  4.0 - 10.5 K/uL Final   RBC 09/09/2021 4.53  3.87 - 5.11 MIL/uL Final   Hemoglobin 09/09/2021 11.8 (L)  12.0 - 15.0 g/dL Final   HCT 09/09/2021 38.0  36.0 - 46.0 % Final   MCV 09/09/2021 83.9  80.0 - 100.0 fL Final   MCH 09/09/2021 26.0  26.0 - 34.0 pg Final   MCHC 09/09/2021 31.1  30.0 - 36.0 g/dL Final   RDW 09/09/2021 17.8 (H)  11.5 - 15.5 % Final   Platelets 09/09/2021 442 (H)  150 - 400 K/uL Final   nRBC 09/09/2021 0.0  0.0 - 0.2 % Final   Neutrophils Relative % 09/09/2021 70  % Final   Neutro Abs 09/09/2021 5.9  1.7 - 7.7 K/uL Final   Lymphocytes Relative 09/09/2021 20  % Final   Lymphs Abs 09/09/2021 1.7  0.7 - 4.0 K/uL Final   Monocytes Relative 09/09/2021 8  % Final   Monocytes Absolute 09/09/2021 0.7  0.1 - 1.0 K/uL Final   Eosinophils  Relative 09/09/2021 1  % Final   Eosinophils Absolute 09/09/2021 0.1  0.0 - 0.5 K/uL Final   Basophils Relative 09/09/2021 1  % Final   Basophils Absolute 09/09/2021 0.1  0.0 - 0.1 K/uL Final   Immature Granulocytes 09/09/2021 0  % Final   Abs Immature Granulocytes 09/09/2021 0.03  0.00 - 0.07 K/uL Final   Performed at [facility], [place] [address], [place], [place] [address]   Ammonia 09/09/2021 37 (H)  9 - 35 umol/L Final   Comment: HEMOLYSIS AT THIS LEVEL MAY AFFECT RESULT Performed at [facility], [place] [address] [place], [place] [address]    Opiates 09/09/2021 NONE DETECTED  NONE DETECTED Final   Cocaine 09/09/2021 NONE DETECTED  NONE DETECTED Final   Benzodiazepines 09/09/2021 NONE DETECTED  NONE DETECTED Final   Amphetamines 09/09/2021 NONE DETECTED  NONE DETECTED Final   Tetrahydrocannabinol  09/09/2021 NONE DETECTED  NONE DETECTED Final   Barbiturates 09/09/2021 NONE DETECTED  NONE DETECTED Final   Comment: (NOTE) DRUG SCREEN FOR MEDICAL PURPOSES ONLY.  IF CONFIRMATION IS NEEDED FOR ANY PURPOSE, NOTIFY LAB WITHIN 5 DAYS.  LOWEST DETECTABLE LIMITS FOR URINE DRUG SCREEN Drug Class                     Cutoff (ng/mL) Amphetamine and metabolites    1000 Barbiturate and metabolites    200 Benzodiazepine                 947 Tricyclics and metabolites     300 Opiates and metabolites        300 Cocaine and metabolites        300 THC                            50 Performed at [facility], [place] [address] [place], [place] [address]    Alcohol, Ethyl (B) 09/09/2021 <10  <10 mg/dL Final   Comment: (NOTE) Lowest detectable limit for serum alcohol is 10 mg/dL.  For medical purposes only. Performed at [facility], [place] [address], [place], [place] [address]    Lipase 09/09/2021 33  11 - 51 U/L Final   Performed at [facility] [address], [place], [place] [address]   Color, Urine 09/09/2021 COLORLESS (A)  YELLOW Final   APPearance 09/09/2021  CLEAR  CLEAR Final   Specific Gravity, Urine 09/09/2021 1.002 (L)  1.005 - 1.030 Final   pH 09/09/2021 6.0  5.0 - 8.0 Final   Glucose, UA 09/09/2021 NEGATIVE  NEGATIVE mg/dL Final   Hgb urine dipstick 09/09/2021 NEGATIVE  NEGATIVE Final   Bilirubin Urine 09/09/2021 NEGATIVE  NEGATIVE Final   Ketones, ur 09/09/2021 NEGATIVE  NEGATIVE mg/dL Final   Protein, ur 09/09/2021 NEGATIVE  NEGATIVE mg/dL Final   Nitrite 09/09/2021 NEGATIVE  NEGATIVE Final   Leukocytes,Ua 09/09/2021 NEGATIVE  NEGATIVE Final   Performed at [facility] [address] [place], [place] [address]   Specimen Description 09/09/2021 URINE, CLEAN CATCH   Final   Special Requests 09/09/2021 NONE   Final   Culture 09/09/2021  (A)   Final                   Value:<10,000 COLONIES/mL INSIGNIFICANT GROWTH Performed at [facility], [place] [address], [place], [place] [address]    Report Status 09/09/2021 09/10/2021 FINAL   Final   D-Dimer, Quant 09/09/2021 0.92 (H)  0.00 - 0.50 ug/mL-FEU Final   Comment: (NOTE) At the manufacturer cut-off value of 0.5 g/mL FEU, this assay has a negative predictive value of 95-100%.This assay is intended for use in conjunction with a clinical pretest probability (PTP) assessment model to exclude pulmonary embolism (PE) and deep venous thrombosis (DVT) in outpatients suspected of PE or DVT. Results should be correlated with clinical presentation. Performed at [facility], [place] [address] [place], [place] [address]    Troponin I (High Sensitivity) 09/09/2021 5  <18 ng/L Final   Comment: (NOTE) Elevated high sensitivity troponin I (hsTnI) values and significant  changes across serial measurements may suggest ACS but many other  chronic and acute conditions are known to elevate hsTnI results.  Refer to the "Links" section for chest pain algorithms and additional  guidance. Performed at [facility], [place]  [address], [place], [place] [address]    BP 09/10/2021 135/83   mmHg Final   S' Lateral 09/10/2021 2.30  cm Final   Area-P 1/2 09/10/2021 5.58  cm2 Final    Blood Alcohol level:  Lab Results  Component Value Date   ETH <10 11/23/2021   ETH <10 75/17/0017    Metabolic Disorder Labs: Lab Results  Component Value Date   HGBA1C 6.7 (H) 12/28/2021   MPG 145.59 12/28/2021   MPG 154.2 10/04/2021   Lab Results  Component Value Date   PROLACTIN 3.8 (L) 11/23/2021   Lab Results  Component Value Date   CHOL 171 11/23/2021   TRIG 125 11/23/2021   HDL 65 11/23/2021   CHOLHDL 2.6 11/23/2021   VLDL 25 11/23/2021   LDLCALC 81 11/23/2021   LDLCALC 95 10/04/2021    Therapeutic Lab Levels: No results found for: "LITHIUM" Lab Results  Component Value Date   VALPROATE 33 (L) 01/18/2022   VALPROATE 33 (L) 12/28/2021   No results found for: "CBMZ"  Physical Findings   PHQ2-9    Flowsheet Row ED from 11/23/2021 in [facility]  PHQ-2 Total Score 2  PHQ-9 Total Score 3      Flowsheet Row ED from 02/09/2022 in [facility] ED from 02/08/2022 in [facility] ED from 01/24/2022 in [facility] No Risk No Risk No Risk        Musculoskeletal  Strength & Muscle Tone: within normal limits Gait & Station: normal Patient leans: N/A  Psychiatric Specialty Exam  Presentation  General Appearance:  Appropriate for Environment; Casual  Eye Contact: Good  Speech: Clear and Coherent; Normal Rate  Speech Volume: Normal  Handedness: Right   Mood and Affect  Mood: Euthymic  Affect: Other (comment)   Thought Process  Thought Processes: Coherent  Descriptions of Associations:Intact  Orientation:Full (Time, Place and Person)  Thought Content:Logical  Diagnosis of Schizophrenia or Schizoaffective disorder in past: Yes  Duration of Psychotic Symptoms: No data recorded   Hallucinations:Hallucinations: None  Ideas of Reference:None  Suicidal Thoughts:Suicidal Thoughts: No  Homicidal Thoughts:Homicidal Thoughts: No   Sensorium  Memory: Immediate Good; Recent Good; Remote Good  Judgment: Good  Insight: Good   Executive Functions  Concentration: Good  Attention Span: Good  Recall: Good  Fund of Knowledge: Good  Language: Good   Psychomotor Activity  Psychomotor Activity: Psychomotor Activity: Normal   Assets  Assets: Communication Skills; Social Support; Desire for Improvement; Resilience; Physical Health; Financial Resources/Insurance   Sleep  Sleep: Sleep: Good   No data recorded  Physical Exam  Physical Exam Vitals and nursing note reviewed.  Constitutional:      General: She is not in acute distress.    Appearance: Normal appearance. She is not ill-appearing.  Eyes:     General: No scleral icterus.       Right eye: No discharge.     Conjunctiva/sclera: Conjunctivae normal.  Cardiovascular:     Rate and Rhythm: Normal rate.  Pulmonary:     Effort: Pulmonary effort is normal. No respiratory distress.  Musculoskeletal:        General: Normal range of motion.     Cervical back: Normal range of motion.  Skin:    Coloration: Skin is not jaundiced or pale.  Neurological:     Mental Status: She is alert and oriented to person, place, and time.  Psychiatric:  Attention and Perception: Attention and perception normal.        Mood and Affect: Mood and affect normal.        Speech: Speech normal.        Behavior: Behavior normal. Behavior is cooperative.        Thought Content: Thought content normal.        Cognition and Memory: Cognition normal.        Judgment: Judgment normal.    Review of Systems  Constitutional: Negative.   HENT: Negative.    Eyes: Negative.   Respiratory: Negative.    Cardiovascular: Negative.   Musculoskeletal: Negative.   Skin: Negative.   Neurological: Negative.    Psychiatric/Behavioral: Negative.     Blood pressure 138/82, pulse 97, temperature 99 F (37.2 C), resp. rate 19, SpO2 100 %. There is no height or weight on file to calculate BMI.  Treatment Plan Summary: Patient continues to be psychiatrically cleared.  DSS/TOC and social work continue to seek placement.  Patient will continue to board on the unit.  Will continue to have daily contact with patient to assess and evaluate symptoms and progress treatment and medication management.    Revonda Humphrey, NP 02/16/2022 9:09 PM

## 2022-02-16 NOTE — ED Notes (Signed)
Pt presents with depressed mood, affect congruent. Teresa Murillo continues to report she is worried about her placement but states '' I know it takes a long time, [facility] ''  Pt con't to be cooperative, pleasant but very fixated on food. Pt given muffin, cereal and milk this am. Denies any SI HI AV Hallucinations. No reports of pain. Pt is safe, able to make needs known.

## 2022-02-17 ENCOUNTER — Emergency Department (HOSPITAL_COMMUNITY): Payer: Medicaid Other

## 2022-02-17 ENCOUNTER — Emergency Department (HOSPITAL_COMMUNITY)
Admission: EM | Admit: 2022-02-17 | Discharge: 2022-02-18 | Disposition: A | Discharge status: Other Acute Inpatient Hospital | Payer: Medicaid Other | Attending: Emergency Medicine | Admitting: Emergency Medicine

## 2022-02-17 ENCOUNTER — Encounter (HOSPITAL_COMMUNITY): Payer: Self-pay

## 2022-02-17 ENCOUNTER — Other Ambulatory Visit: Payer: Self-pay

## 2022-02-17 DIAGNOSIS — R0689 Other abnormalities of breathing: Secondary | ICD-10-CM | POA: Insufficient documentation

## 2022-02-17 DIAGNOSIS — Z7951 Long term (current) use of inhaled steroids: Secondary | ICD-10-CM | POA: Insufficient documentation

## 2022-02-17 DIAGNOSIS — J449 Chronic obstructive pulmonary disease, unspecified: Secondary | ICD-10-CM | POA: Diagnosis not present

## 2022-02-17 DIAGNOSIS — R079 Chest pain, unspecified: Secondary | ICD-10-CM | POA: Insufficient documentation

## 2022-02-17 DIAGNOSIS — Z7984 Long term (current) use of oral hypoglycemic drugs: Secondary | ICD-10-CM | POA: Insufficient documentation

## 2022-02-17 DIAGNOSIS — Z794 Long term (current) use of insulin: Secondary | ICD-10-CM | POA: Insufficient documentation

## 2022-02-17 DIAGNOSIS — Z79899 Other long term (current) drug therapy: Secondary | ICD-10-CM | POA: Insufficient documentation

## 2022-02-17 DIAGNOSIS — Z7901 Long term (current) use of anticoagulants: Secondary | ICD-10-CM | POA: Diagnosis not present

## 2022-02-17 DIAGNOSIS — E119 Type 2 diabetes mellitus without complications: Secondary | ICD-10-CM | POA: Insufficient documentation

## 2022-02-17 DIAGNOSIS — E039 Hypothyroidism, unspecified: Secondary | ICD-10-CM | POA: Insufficient documentation

## 2022-02-17 DIAGNOSIS — F209 Schizophrenia, unspecified: Secondary | ICD-10-CM | POA: Diagnosis not present

## 2022-02-17 LAB — CBC
HCT: 33.2 % — ABNORMAL LOW (ref 36.0–46.0)
Hemoglobin: 10.2 g/dL — ABNORMAL LOW (ref 12.0–15.0)
MCH: 24.1 pg — ABNORMAL LOW (ref 26.0–34.0)
MCHC: 30.7 g/dL (ref 30.0–36.0)
MCV: 78.3 fL — ABNORMAL LOW (ref 80.0–100.0)
Platelets: 390 10*3/uL (ref 150–400)
RBC: 4.24 MIL/uL (ref 3.87–5.11)
RDW: 17.2 % — ABNORMAL HIGH (ref 11.5–15.5)
WBC: 6.5 10*3/uL (ref 4.0–10.5)
nRBC: 0 % (ref 0.0–0.2)

## 2022-02-17 LAB — BASIC METABOLIC PANEL
Anion gap: 10 (ref 5–15)
BUN: 9 mg/dL (ref 6–20)
CO2: 25 mmol/L (ref 22–32)
Calcium: 8.9 mg/dL (ref 8.9–10.3)
Chloride: 101 mmol/L (ref 98–111)
Creatinine, Ser: 0.5 mg/dL (ref 0.44–1.00)
GFR, Estimated: >60 mL/min (ref >60)
Glucose, Bld: 113 mg/dL — ABNORMAL HIGH (ref 70–99)
Potassium: 3.9 mmol/L (ref 3.5–5.1)
Sodium: 136 mmol/L (ref 135–145)

## 2022-02-17 LAB — BRAIN NATRIURETIC PEPTIDE: B Natriuretic Peptide: 30.9 pg/mL (ref 0.0–100.0)

## 2022-02-17 LAB — TROPONIN I (HIGH SENSITIVITY): Troponin I (High Sensitivity): 4 ng/L (ref <18)

## 2022-02-17 LAB — GLUCOSE, CAPILLARY: Glucose-Capillary: 148 mg/dL — ABNORMAL HIGH (ref 70–99)

## 2022-02-17 LAB — VALPROIC ACID LEVEL: Valproic Acid Lvl: 17 ug/mL — ABNORMAL LOW (ref 50.0–100.0)

## 2022-02-17 NOTE — ED Notes (Signed)
Pt complaining of CP.  Vital signs obtained amd Dr. Hazle Quant in to assess patient. Manual HR 128. BP 151/73. Patient is showing signs of shortness of breathe when talking to staff. Report called to [facility] RN at [facility] EMS called per provider.

## 2022-02-17 NOTE — ED Provider Notes (Signed)
Teresa Murillo is briefly reassessed by this provider.  She reports episode of mid-sternal chest pain, approximately 5 minutes ago, pain measured 10 out of 10.  She reports pain gradually lessening and now also in the left shoulder.  She denies shortness of breath, denies dizziness. Reviewed treatment plan to include emergency department evaluation at [facility] emergency department, patient verbalized understanding and agreement with plan. Spoke with Dr. Lynelle Doctor at [facility] emergency department who accepts patient for evaluation.  Patient is eligible to return to [facility] behavioral health once medically cleared.

## 2022-02-17 NOTE — ED Notes (Signed)
Patient accepted scheduled PO meds and insulin without difficulty. Breakfast provided after checking blood sugar. Safety maintained and will continue to monitor.

## 2022-02-17 NOTE — ED Triage Notes (Signed)
Pt coming from [facility] with EMS for evaluation of chest pain that is intermittent. Pt denies any chest pain at this time, states she was kicked out of her group home so she called the police and was taken to [facility]. Pt states she had an appointment with a social worker at noon today to help her find a place to stay but missed the appointment, requesting to speak with a Child psychotherapist here.

## 2022-02-17 NOTE — Care Management (Signed)
OBS Care Management   Writer scanned the signed PCP signature Page to [facility] [phone])  Ines Bloomer is the Group Home owner.   Per the Care Manager at [facility] Madilyn Fireman [phone]), the patient has been authorized to go to the facility pending receipt of the signed signature page.   Writer will follow up with the Care Manager at [facility] in the morning to discuss a safe discharge plan from the OBS Unit to the [facility] once the patent is medically cleared from the emergency room.

## 2022-02-17 NOTE — ED Notes (Signed)
EMS arrived and assessed patient. Security present to open doors. Emtala provided to EMS staff. Report previously called. Safety maintained. Patient left all personal belongings at the facility, took her glasses.

## 2022-02-17 NOTE — ED Provider Notes (Signed)
Behavioral Health Progress Note  Date and Time: 02/17/2022 9:41 AM Name: Teresa Murillo MRN:  [id number]  Subjective:    Teresa Murillo is a 61 y.o. female with a past psychiatric history of schizophrenia, aggressive behavior, and possible intellectual disability presenting to [facility] on 11/23/21 voluntarily as a walk in via [facility] with complaints that she was locked out of the group home. Patient has been dismissed from group home due to multiple elopements. Pt had already been discharged from this facility but had been living there temporarily while DSS was looking for new placement. Pt is boarding at [facility].   AID to Capacity Evaluation (ACE) completed by Dr. Louis Meckel, Dr. Dwyane Dee on 12/11/2021.  Please see media tab for full details.    [name], PhD completed: Wechsler Adult Intelligence Scale-4, Ms. Dunlap achieved a full-scale IQ score of 73 and a percentile rank of 4 placing her in the borderline range of intellectual functioning (12/14/2021) Please see consult note from [name], PhD on 12/14/2021.  On reassessment, pt is a&ox3, in no acute distress. She reports euthymic mood. She verbalizes understanding that social work is continuing to seek placement for her. She denies suicidal, homicidal or violent ideations, plan or intent. She denies auditory visual hallucinations or paranoia. There is no evidence of agitation, aggression, distractibility or internal preoccupation. No delusions or paranoia elicited. Pt is calm, cooperative, pleasant. Pt remains psychiatrically cleared. Disposition is pending placement.  Diagnosis:  Final diagnoses:  At risk for self care deficit  Schizophrenia, unspecified type ([place])  Noncompliance with medication regimen  Borderline intellectual functioning    Total Time spent with patient: 15 minutes  Past Psychiatric History: see HPI Past Medical History:  Past Medical History:  Diagnosis Date   Borderline intellectual functioning  12/14/2021   On 12/14/2021: Appreciate assistance from psychology consult. On the Wechsler Adult Intelligence Scale-4, Ms. Shedrick achieved a full-scale IQ score of 73 and a percentile rank of 4 placing her in the borderline range of intellectual functioning.    Chronic obstructive pulmonary disease (COPD) (HCC)    Glaucoma    Hyperlipidemia    Hypertension    Iron deficiency    Schizoaffective disorder (HCC)    Type 2 diabetes mellitus (HCC)     Past Surgical History:  Procedure Laterality Date   TUBAL LIGATION     Family History:  Family History  Problem Relation Age of Onset   Breast cancer Maternal Grandmother    Family Psychiatric  History: None reported Social History:  Social History   Substance and Sexual Activity  Alcohol Use Not Currently     Social History   Substance and Sexual Activity  Drug Use Not Currently    Social History   Socioeconomic History   Marital status: Divorced    Spouse name: Not on file   Number of children: Not on file   Years of education: Not on file   Highest education level: Not on file  Occupational History   Not on file  Tobacco Use   Smoking status: Former    Packs/day: 1.00    Types: Cigars, Cigarettes    Quit date: 11/09/2021    Years since quitting: 0.2   Smokeless tobacco: Current  Vaping Use   Vaping Use: Never used  Substance and Sexual Activity   Alcohol use: Not Currently   Drug use: Not Currently   Sexual activity: Not Currently    Birth control/protection: Surgical  Other Topics Concern   Not on file  Social History Narrative   Not on file   Social Determinants of Health   Financial Resource Strain: Not on file  Food Insecurity: Not on file  Transportation Needs: Not on file  Physical Activity: Not on file  Stress: Not on file  Social Connections: Not on file   SDOH:  SDOH Screenings   Depression (PHQ2-9): Low Risk  (11/23/2021)  Tobacco Use: High Risk (02/08/2022)   Additional Social History:                          Sleep: Good  Appetite:  Good  Current Medications:  Current Facility-Administered Medications  Medication Dose Route Frequency Provider Last Rate Last Admin   acetaminophen (TYLENOL) tablet 650 mg  650 mg Oral Q6H PRN Onuoha, Chinwendu V, NP   650 mg at 02/16/22 0702   alum & mag hydroxide-simeth (MAALOX/MYLANTA) 200-200-20 MG/5ML suspension 30 mL  30 mL Oral Q4H PRN Onuoha, Chinwendu V, NP   30 mL at 02/14/22 1145   apixaban (ELIQUIS) tablet 5 mg  5 mg Oral BID Onuoha, Chinwendu V, NP   5 mg at 02/17/22 6144   ARIPiprazole (ABILIFY) tablet 10 mg  10 mg Oral Daily Onuoha, Chinwendu V, NP   10 mg at 02/17/22 3154   cloZAPine (CLOZARIL) tablet 75 mg  75 mg Oral Aliene Altes, MD   75 mg at 02/16/22 2113   cloZAPine (CLOZARIL) tablet 75 mg  75 mg Oral Daily France Ravens, MD   75 mg at 02/17/22 0086   colchicine tablet 0.6 mg  0.6 mg Oral QHS Onuoha, Chinwendu V, NP   0.6 mg at 02/16/22 2113   diltiazem (CARDIZEM CD) 24 hr capsule 240 mg  240 mg Oral Daily Onuoha, Chinwendu V, NP   240 mg at 02/17/22 7619   divalproex (DEPAKOTE ER) 24 hr tablet 500 mg  500 mg Oral BID Onuoha, Chinwendu V, NP   500 mg at 02/17/22 5093   ferrous sulfate tablet 325 mg  325 mg Oral Q breakfast Onuoha, Chinwendu V, NP   325 mg at 02/17/22 2671   haloperidol (HALDOL) tablet 5 mg  5 mg Oral TID PRN France Ravens, MD       hydrOXYzine (ATARAX) tablet 25 mg  25 mg Oral TID PRN Onuoha, Chinwendu V, NP   25 mg at 02/16/22 2111   insulin aspart (novoLOG) injection 0-5 Units  0-5 Units Subcutaneous QHS Onuoha, Chinwendu V, NP       insulin aspart (novoLOG) injection 0-9 Units  0-9 Units Subcutaneous TID WC Onuoha, Chinwendu V, NP   1 Units at 02/17/22 0827   latanoprost (XALATAN) 0.005 % ophthalmic solution 1 drop  1 drop Both Eyes QHS [place], Ene A, NP   1 drop at 02/16/22 2130   loperamide (IMODIUM) capsule 2 mg  2 mg Oral PRN White, Patrice L, NP       magnesium hydroxide (MILK OF MAGNESIA)  suspension 30 mL  30 mL Oral Daily PRN Onuoha, Chinwendu V, NP       menthol-cetylpyridinium (CEPACOL) lozenge 3 mg  1 lozenge Oral PRN Onuoha, Chinwendu V, NP   3 mg at 02/16/22 2009   metFORMIN (GLUCOPHAGE) tablet 500 mg  500 mg Oral BID WC Onuoha, Chinwendu V, NP   500 mg at 02/17/22 0828   ondansetron (ZOFRAN-ODT) disintegrating tablet 4 mg  4 mg Oral Q8H PRN White, Patrice L, NP       sodium chloride (  OCEAN) 0.65 % nasal spray 1 spray  1 spray Each Nare PRN France Ravens, MD       traZODone (DESYREL) tablet 50 mg  50 mg Oral QHS PRN Onuoha, Chinwendu V, NP   50 mg at 02/16/22 2112   valbenazine (INGREZZA) capsule 40 mg  40 mg Oral q AM Onuoha, Chinwendu V, NP   40 mg at 02/17/22 2992   Vitamin D (Ergocalciferol) (DRISDOL) 1.25 MG (50000 UNIT) capsule 50,000 Units  50,000 Units Oral Q7 days Onuoha, Chinwendu V, NP   50,000 Units at 02/11/22 0902   Current Outpatient Medications  Medication Sig Dispense Refill   Accu-Chek Softclix Lancets lancets Use as directed up to four times daily 100 each 0   apixaban (ELIQUIS) 5 MG TABS tablet Take 1 tablet (5 mg total) by mouth 2 (two) times daily. 60 tablet 0   ARIPiprazole (ABILIFY) 10 MG tablet Take 1 tablet (10 mg total) by mouth daily. 30 tablet 0   Blood Glucose Monitoring Suppl (ACCU-CHEK GUIDE) w/Device KIT Use as directed up to four times daily 1 kit 0   budesonide-formoterol (SYMBICORT) 160-4.5 MCG/ACT inhaler Inhale 2 puffs into the lungs in the morning and at bedtime.     Cholecalciferol (VITAMIN D3) 1.25 MG (50000 UT) CAPS Take 50,000 Units by mouth every Thursday.     clozapine (CLOZARIL) 50 MG tablet Take 1 tablet (50 mg total) by mouth daily. 30 tablet 0   colchicine 0.6 MG tablet Take 1 tablet (0.6 mg total) by mouth at bedtime. 30 tablet 0   diltiazem (CARDIZEM CD) 240 MG 24 hr capsule Take 1 capsule (240 mg total) by mouth daily. (Patient not taking: Reported on 11/24/2021) 30 capsule 0   divalproex (DEPAKOTE ER) 500 MG 24 hr tablet Take  1 tablet (500 mg total) by mouth 2 (two) times daily. 60 tablet 0   glucose blood test strip Use as directed up to four times daily 50 each 0   haloperidol (HALDOL) 10 MG tablet Take 1 tablet (10 mg total) by mouth 3 (three) times daily as needed for agitation (and psychotic symptoms).     INGREZZA 40 MG capsule Take 1 capsule (40 mg total) by mouth in the morning. 30 capsule 0   insulin aspart (NOVOLOG) 100 UNIT/ML FlexPen Before each meal 3 times a day, 140-199 - 2 units, 200-250 - 4 units, 251-299 - 6 units,  300-349 - 8 units,  350 or above 10 units. Insulin PEN if approved, provide syringes and needles if needed.Please switch to any approved short acting Insulin if needed. 15 mL 0   insulin glargine (LANTUS) 100 UNIT/ML Solostar Pen Inject 12 Units into the skin daily. 15 mL 0   Insulin Pen Needle 32G X 4 MM MISC Use 4 times a day with insulin, 1 month supply. 100 each 0   latanoprost (XALATAN) 0.005 % ophthalmic solution Place 1 drop into both eyes at bedtime.     metFORMIN (GLUCOPHAGE) 500 MG tablet Take 500 mg by mouth 2 (two) times daily with a meal.     nicotine (NICODERM CQ - DOSED IN MG/24 HR) 7 mg/24hr patch Place 1 patch (7 mg total) onto the skin daily. 28 patch 0   PROAIR HFA 108 (90 Base) MCG/ACT inhaler Inhale 2 puffs into the lungs every 6 (six) hours as needed for wheezing or shortness of breath.      Labs  Lab Results:  Admission on 02/09/2022  Component Date Value Ref Range Status  Glucose-Capillary 02/09/2022 118 (H)  70 - 99 mg/dL Final   Glucose reference range applies only to samples taken after fasting for at least 8 hours.   Glucose-Capillary 02/10/2022 148 (H)  70 - 99 mg/dL Final   Glucose reference range applies only to samples taken after fasting for at least 8 hours.   Glucose-Capillary 02/10/2022 174 (H)  70 - 99 mg/dL Final   Glucose reference range applies only to samples taken after fasting for at least 8 hours.   Glucose-Capillary 02/10/2022 128 (H)  70  - 99 mg/dL Final   Glucose reference range applies only to samples taken after fasting for at least 8 hours.   Glucose-Capillary 02/10/2022 182 (H)  70 - 99 mg/dL Final   Glucose reference range applies only to samples taken after fasting for at least 8 hours.   Glucose-Capillary 02/11/2022 158 (H)  70 - 99 mg/dL Final   Glucose reference range applies only to samples taken after fasting for at least 8 hours.   Glucose-Capillary 02/11/2022 159 (H)  70 - 99 mg/dL Final   Glucose reference range applies only to samples taken after fasting for at least 8 hours.   Glucose-Capillary 02/11/2022 153 (H)  70 - 99 mg/dL Final   Glucose reference range applies only to samples taken after fasting for at least 8 hours.   Glucose-Capillary 02/11/2022 159 (H)  70 - 99 mg/dL Final   Glucose reference range applies only to samples taken after fasting for at least 8 hours.   Glucose-Capillary 02/11/2022 153 (H)  70 - 99 mg/dL Final   Glucose reference range applies only to samples taken after fasting for at least 8 hours.   Glucose-Capillary 02/11/2022 109 (H)  70 - 99 mg/dL Final   Glucose reference range applies only to samples taken after fasting for at least 8 hours.   Glucose-Capillary 02/11/2022 213 (H)  70 - 99 mg/dL Final   Glucose reference range applies only to samples taken after fasting for at least 8 hours.   Glucose-Capillary 02/11/2022 229 (H)  70 - 99 mg/dL Final   Glucose reference range applies only to samples taken after fasting for at least 8 hours.   Glucose-Capillary 02/12/2022 128 (H)  70 - 99 mg/dL Final   Glucose reference range applies only to samples taken after fasting for at least 8 hours.   Glucose-Capillary 02/12/2022 149 (H)  70 - 99 mg/dL Final   Glucose reference range applies only to samples taken after fasting for at least 8 hours.   Color, Urine 02/12/2022 YELLOW  YELLOW Final   APPearance 02/12/2022 CLEAR  CLEAR Final   Specific Gravity, Urine 02/12/2022 1.015  1.005 -  1.030 Final   pH 02/12/2022 5.0  5.0 - 8.0 Final   Glucose, UA 02/12/2022 NEGATIVE  NEGATIVE mg/dL Final   Hgb urine dipstick 02/12/2022 NEGATIVE  NEGATIVE Final   Bilirubin Urine 02/12/2022 NEGATIVE  NEGATIVE Final   Ketones, ur 02/12/2022 NEGATIVE  NEGATIVE mg/dL Final   Protein, ur 02/12/2022 NEGATIVE  NEGATIVE mg/dL Final   Nitrite 02/12/2022 NEGATIVE  NEGATIVE Final   Leukocytes,Ua 02/12/2022 NEGATIVE  NEGATIVE Final   Performed at [facility], [place] [address] [place], [place] [address]   Specimen Description 02/12/2022 URINE, CLEAN CATCH   Final   Special Requests 02/12/2022    Final                   Value:NONE Performed at [facility], [address] [place], [place] [address]    Culture 02/12/2022 10,000 COLONIES/mL PROTEUS MIRABILIS (A)   Final   Report Status 02/12/2022 02/15/2022 FINAL   Final   Organism ID, Bacteria 02/12/2022 PROTEUS MIRABILIS (A)   Final   Glucose-Capillary 02/12/2022 128 (H)  70 - 99 mg/dL Final   Glucose reference range applies only to samples taken after fasting for at least 8 hours.   Glucose-Capillary 02/12/2022 196 (H)  70 - 99 mg/dL Final   Glucose reference range applies only to samples taken after fasting for at least 8 hours.   Glucose-Capillary 02/13/2022 168 (H)  70 - 99 mg/dL Final   Glucose reference range applies only to samples taken after fasting for at least 8 hours.   Glucose-Capillary 02/13/2022 153 (H)  70 - 99 mg/dL Final   Glucose reference range applies only to samples taken after fasting for at least 8 hours.   Glucose-Capillary 02/13/2022 134 (H)  70 - 99 mg/dL Final   Glucose reference range applies only to samples taken after fasting for at least 8 hours.   Glucose-Capillary 02/13/2022 178 (H)  70 - 99 mg/dL Final   Glucose reference range applies only to samples taken after fasting for at least 8 hours.   Glucose-Capillary 02/14/2022 156 (H)  70 - 99 mg/dL Final   Glucose reference range applies only to samples  taken after fasting for at least 8 hours.   Glucose-Capillary 02/14/2022 169 (H)  70 - 99 mg/dL Final   Glucose reference range applies only to samples taken after fasting for at least 8 hours.   Glucose-Capillary 02/14/2022 156 (H)  70 - 99 mg/dL Final   Glucose reference range applies only to samples taken after fasting for at least 8 hours.   Glucose-Capillary 02/14/2022 148 (H)  70 - 99 mg/dL Final   Glucose reference range applies only to samples taken after fasting for at least 8 hours.   Glucose-Capillary 02/15/2022 159 (H)  70 - 99 mg/dL Final   Glucose reference range applies only to samples taken after fasting for at least 8 hours.   Glucose-Capillary 02/15/2022 125 (H)  70 - 99 mg/dL Final   Glucose reference range applies only to samples taken after fasting for at least 8 hours.   Glucose-Capillary 02/15/2022 142 (H)  70 - 99 mg/dL Final   Glucose reference range applies only to samples taken after fasting for at least 8 hours.   Glucose-Capillary 02/15/2022 128 (H)  70 - 99 mg/dL Final   Glucose reference range applies only to samples taken after fasting for at least 8 hours.   Glucose-Capillary 02/16/2022 137 (H)  70 - 99 mg/dL Final   Glucose reference range applies only to samples taken after fasting for at least 8 hours.   WBC 02/16/2022 6.0  4.0 - 10.5 K/uL Final   RBC 02/16/2022 4.48  3.87 - 5.11 MIL/uL Final   Hemoglobin 02/16/2022 10.7 (L)  12.0 - 15.0 g/dL Final   HCT 02/16/2022 35.4 (L)  36.0 - 46.0 % Final   MCV 02/16/2022 79.0 (L)  80.0 - 100.0 fL Final   MCH 02/16/2022 23.9 (L)  26.0 - 34.0 pg Final   MCHC 02/16/2022 30.2  30.0 - 36.0 g/dL Final   RDW 02/16/2022 17.0 (H)  11.5 - 15.5 % Final   Platelets 02/16/2022 387  150 - 400 K/uL Final   nRBC 02/16/2022 0.0  0.0 - 0.2 % Final   Neutrophils Relative % 02/16/2022 54  %  Final   Neutro Abs 02/16/2022 3.2  1.7 - 7.7 K/uL Final   Lymphocytes Relative 02/16/2022 35  % Final   Lymphs Abs 02/16/2022 2.1  0.7 - 4.0  K/uL Final   Monocytes Relative 02/16/2022 6  % Final   Monocytes Absolute 02/16/2022 0.4  0.1 - 1.0 K/uL Final   Eosinophils Relative 02/16/2022 2  % Final   Eosinophils Absolute 02/16/2022 0.1  0.0 - 0.5 K/uL Final   Basophils Relative 02/16/2022 2  % Final   Basophils Absolute 02/16/2022 0.1  0.0 - 0.1 K/uL Final   Immature Granulocytes 02/16/2022 1  % Final   Abs Immature Granulocytes 02/16/2022 0.04  0.00 - 0.07 K/uL Final   Performed at [facility], [place] [address] [place], [place] [address]   Glucose-Capillary 02/16/2022 131 (H)  70 - 99 mg/dL Final   Glucose reference range applies only to samples taken after fasting for at least 8 hours.   Glucose-Capillary 02/16/2022 123 (H)  70 - 99 mg/dL Final   Glucose reference range applies only to samples taken after fasting for at least 8 hours.   Glucose-Capillary 02/16/2022 195 (H)  70 - 99 mg/dL Final   Glucose reference range applies only to samples taken after fasting for at least 8 hours.   Glucose-Capillary 02/17/2022 148 (H)  70 - 99 mg/dL Final   Glucose reference range applies only to samples taken after fasting for at least 8 hours.  Admission on 02/08/2022, Discharged on 02/09/2022  Component Date Value Ref Range Status   B Natriuretic Peptide 02/08/2022 40.2  0.0 - 100.0 pg/mL Final   Performed at [facility], [address] [place], [place] [address]   Troponin I (High Sensitivity) 02/08/2022 4  <18 ng/L Final   Comment: (NOTE) Elevated high sensitivity troponin I (hsTnI) values and significant  changes across serial measurements may suggest ACS but many other  chronic and acute conditions are known to elevate hsTnI results.  Refer to the "Links" section for chest pain algorithms and additional  guidance. Performed at [facility], [place] [address] [place], [place] [address]    WBC 02/08/2022 8.5  4.0 - 10.5 K/uL Final   RBC 02/08/2022 4.30  3.87 - 5.11 MIL/uL Final   Hemoglobin 02/08/2022 10.7 (L)   12.0 - 15.0 g/dL Final   HCT 02/08/2022 33.1 (L)  36.0 - 46.0 % Final   MCV 02/08/2022 77.0 (L)  80.0 - 100.0 fL Final   MCH 02/08/2022 24.9 (L)  26.0 - 34.0 pg Final   MCHC 02/08/2022 32.3  30.0 - 36.0 g/dL Final   RDW 02/08/2022 16.5 (H)  11.5 - 15.5 % Final   Platelets 02/08/2022 361  150 - 400 K/uL Final   nRBC 02/08/2022 0.0  0.0 - 0.2 % Final   Performed at [place] [address] [place], [place] [address]   Sodium 02/08/2022 131 (L)  135 - 145 mmol/L Final   Potassium 02/08/2022 4.4  3.5 - 5.1 mmol/L Final   Chloride 02/08/2022 96 (L)  98 - 111 mmol/L Final   CO2 02/08/2022 25  22 - 32 mmol/L Final   Glucose, Bld 02/08/2022 126 (H)  70 - 99 mg/dL Final   Glucose reference range applies only to samples taken after fasting for at least 8 hours.   BUN 02/08/2022 11  6 - 20 mg/dL Final   Creatinine, Ser 02/08/2022 0.52  0.44 - 1.00 mg/dL Final  Calcium 02/08/2022 9.4  8.9 - 10.3 mg/dL Final   Total Protein 02/08/2022 7.5  6.5 - 8.1 g/dL Final   Albumin 02/08/2022 3.6  3.5 - 5.0 g/dL Final   AST 02/08/2022 22  15 - 41 U/L Final   ALT 02/08/2022 20  0 - 44 U/L Final   Alkaline Phosphatase 02/08/2022 67  38 - 126 U/L Final   Total Bilirubin 02/08/2022 0.1 (L)  0.3 - 1.2 mg/dL Final   GFR, Estimated 02/08/2022 >60  >60 mL/min Final   Comment: (NOTE) Calculated using the CKD-EPI Creatinine Equation (2021)    Anion gap 02/08/2022 10  5 - 15 Final   Performed at [facility], [place] [address], [place], [place] [address]   Troponin I (High Sensitivity) 02/08/2022 5  <18 ng/L Final   Comment: (NOTE) Elevated high sensitivity troponin I (hsTnI) values and significant  changes across serial measurements may suggest ACS but many other  chronic and acute conditions are known to elevate hsTnI results.  Refer to the "Links" section for chest pain algorithms and additional  guidance. Performed at [facility], [place] [address], [place], [place] [address]    WBC  02/09/2022 7.3  4.0 - 10.5 K/uL Final   RBC 02/09/2022 4.70  3.87 - 5.11 MIL/uL Final   Hemoglobin 02/09/2022 11.4 (L)  12.0 - 15.0 g/dL Final   HCT 02/09/2022 36.6  36.0 - 46.0 % Final   MCV 02/09/2022 77.9 (L)  80.0 - 100.0 fL Final   MCH 02/09/2022 24.3 (L)  26.0 - 34.0 pg Final   MCHC 02/09/2022 31.1  30.0 - 36.0 g/dL Final   RDW 02/09/2022 16.6 (H)  11.5 - 15.5 % Final   Platelets 02/09/2022 403 (H)  150 - 400 K/uL Final   nRBC 02/09/2022 0.0  0.0 - 0.2 % Final   Performed at [place] [address], [place], [place] [address]   Glucose-Capillary 02/08/2022 117 (H)  70 - 99 mg/dL Final   Glucose reference range applies only to samples taken after fasting for at least 8 hours.   Sodium 02/09/2022 138  135 - 145 mmol/L Final   Potassium 02/09/2022 3.7  3.5 - 5.1 mmol/L Final   Chloride 02/09/2022 103  98 - 111 mmol/L Final   CO2 02/09/2022 24  22 - 32 mmol/L Final   Glucose, Bld 02/09/2022 134 (H)  70 - 99 mg/dL Final   Glucose reference range applies only to samples taken after fasting for at least 8 hours.   BUN 02/09/2022 9  6 - 20 mg/dL Final   Creatinine, Ser 02/09/2022 0.44  0.44 - 1.00 mg/dL Final   Calcium 02/09/2022 8.3 (L)  8.9 - 10.3 mg/dL Final   GFR, Estimated 02/09/2022 >60  >60 mL/min Final   Comment: (NOTE) Calculated using the CKD-EPI Creatinine Equation (2021)    Anion gap 02/09/2022 11  5 - 15 Final   Performed at [facility], [place] [address], [place], [place] [address]   Iron 02/09/2022 20 (L)  28 - 170 ug/dL Final   TIBC 02/09/2022 455 (H)  250 - 450 ug/dL Final   Saturation Ratios 02/09/2022 4 (L)  10.4 - 31.8 % Final   UIBC 02/09/2022 435  ug/dL Final   Performed at [facility], [place] [address] [place], [place] [address]   Ferritin 02/09/2022 2 (L)  11 - 307 ng/mL Final   Performed at [facility], [address] [place], [place] [address]   Weight 02/09/2022 2,846.58  oz Final   Height 02/09/2022 62  in Final   BP  02/09/2022 143/80  mmHg Final   WBC 02/09/2022 8.1  4.0 - 10.5 K/uL Final   RBC 02/09/2022 4.38  3.87 - 5.11 MIL/uL Final   Hemoglobin 02/09/2022 10.9 (L)  12.0 - 15.0 g/dL Final   HCT 02/09/2022 33.3 (L)  36.0 - 46.0 % Final   MCV 02/09/2022 76.0 (L)  80.0 - 100.0 fL Final   MCH 02/09/2022 24.9 (L)  26.0 - 34.0 pg Final   MCHC 02/09/2022 32.7  30.0 - 36.0 g/dL Final   RDW 02/09/2022 16.5 (H)  11.5 - 15.5 % Final   Platelets 02/09/2022 397  150 - 400 K/uL Final   nRBC 02/09/2022 0.0  0.0 - 0.2 % Final   Neutrophils Relative % 02/09/2022 66  % Final   Neutro Abs 02/09/2022 5.4  1.7 - 7.7 K/uL Final   Lymphocytes Relative 02/09/2022 24  % Final   Lymphs Abs 02/09/2022 1.9  0.7 - 4.0 K/uL Final   Monocytes Relative 02/09/2022 8  % Final   Monocytes Absolute 02/09/2022 0.6  0.1 - 1.0 K/uL Final   Eosinophils Relative 02/09/2022 1  % Final   Eosinophils Absolute 02/09/2022 0.1  0.0 - 0.5 K/uL Final   Basophils Relative 02/09/2022 1  % Final   Basophils Absolute 02/09/2022 0.1  0.0 - 0.1 K/uL Final   Immature Granulocytes 02/09/2022 0  % Final   Abs Immature Granulocytes 02/09/2022 0.03  0.00 - 0.07 K/uL Final   Performed at [facility], [place] [address] [place], [place] [address]   Glucose-Capillary 02/09/2022 238 (H)  70 - 99 mg/dL Final   Glucose reference range applies only to samples taken after fasting for at least 8 hours.   Glucose-Capillary 02/09/2022 91  70 - 99 mg/dL Final   Glucose reference range applies only to samples taken after fasting for at least 8 hours.   CRP 02/09/2022 <0.5  <1.0 mg/dL Final   Performed at [place] [address] [place], [place] [address]   Sed Rate 02/09/2022 15  0 - 22 mm/hr Final   Performed at [place] [address], [place], [place] [address]   Glucose-Capillary 02/09/2022 137 (H)  70 - 99 mg/dL Final   Glucose reference range applies only to samples taken after fasting for at least 8 hours.  Admission on  01/24/2022, Discharged on 02/08/2022  Component Date Value Ref Range Status   Glucose-Capillary 01/24/2022 143 (H)  70 - 99 mg/dL Final   Glucose reference range applies only to samples taken after fasting for at least 8 hours.   Glucose-Capillary 01/24/2022 120 (H)  70 - 99 mg/dL Final   Glucose reference range applies only to samples taken after fasting for at least 8 hours.   Glucose-Capillary 01/25/2022 122 (H)  70 - 99 mg/dL Final   Glucose reference range applies only to samples taken after fasting for at least 8 hours.   Glucose-Capillary 01/25/2022 190 (H)  70 - 99 mg/dL Final   Glucose reference range applies only to samples taken after fasting for at least 8 hours.   Glucose-Capillary 01/25/2022 163 (H)  70 - 99 mg/dL Final   Glucose reference range applies only to samples taken after fasting for at least 8 hours.   WBC 01/26/2022 6.5  4.0 - 10.5 K/uL Final   RBC  01/26/2022 4.53  3.87 - 5.11 MIL/uL Final   Hemoglobin 01/26/2022 11.3 (L)  12.0 - 15.0 g/dL Final   HCT 01/26/2022 36.4  36.0 - 46.0 % Final   MCV 01/26/2022 80.4  80.0 - 100.0 fL Final   MCH 01/26/2022 24.9 (L)  26.0 - 34.0 pg Final   MCHC 01/26/2022 31.0  30.0 - 36.0 g/dL Final   RDW 01/26/2022 17.3 (H)  11.5 - 15.5 % Final   Platelets 01/26/2022 327  150 - 400 K/uL Final   nRBC 01/26/2022 0.0  0.0 - 0.2 % Final   Neutrophils Relative % 01/26/2022 60  % Final   Neutro Abs 01/26/2022 3.9  1.7 - 7.7 K/uL Final   Lymphocytes Relative 01/26/2022 31  % Final   Lymphs Abs 01/26/2022 2.0  0.7 - 4.0 K/uL Final   Monocytes Relative 01/26/2022 7  % Final   Monocytes Absolute 01/26/2022 0.5  0.1 - 1.0 K/uL Final   Eosinophils Relative 01/26/2022 1  % Final   Eosinophils Absolute 01/26/2022 0.1  0.0 - 0.5 K/uL Final   Basophils Relative 01/26/2022 1  % Final   Basophils Absolute 01/26/2022 0.1  0.0 - 0.1 K/uL Final   Immature Granulocytes 01/26/2022 0  % Final   Abs Immature Granulocytes 01/26/2022 0.02  0.00 - 0.07 K/uL  Final   Performed at [facility], [place] [address], [place], [place] [address]   Glucose-Capillary 01/26/2022 149 (H)  70 - 99 mg/dL Final   Glucose reference range applies only to samples taken after fasting for at least 8 hours.   Glucose-Capillary 01/26/2022 130 (H)  70 - 99 mg/dL Final   Glucose reference range applies only to samples taken after fasting for at least 8 hours.   Glucose-Capillary 01/26/2022 151 (H)  70 - 99 mg/dL Final   Glucose reference range applies only to samples taken after fasting for at least 8 hours.   Glucose-Capillary 01/26/2022 170 (H)  70 - 99 mg/dL Final   Glucose reference range applies only to samples taken after fasting for at least 8 hours.   Glucose-Capillary 01/27/2022 129 (H)  70 - 99 mg/dL Final   Glucose reference range applies only to samples taken after fasting for at least 8 hours.   Glucose-Capillary 01/27/2022 101 (H)  70 - 99 mg/dL Final   Glucose reference range applies only to samples taken after fasting for at least 8 hours.   Glucose-Capillary 01/27/2022 220 (H)  70 - 99 mg/dL Final   Glucose reference range applies only to samples taken after fasting for at least 8 hours.   Glucose-Capillary 01/27/2022 154 (H)  70 - 99 mg/dL Final   Glucose reference range applies only to samples taken after fasting for at least 8 hours.   Glucose-Capillary 01/27/2022 163 (H)  70 - 99 mg/dL Final   Glucose reference range applies only to samples taken after fasting for at least 8 hours.   Glucose-Capillary 01/28/2022 127 (H)  70 - 99 mg/dL Final   Glucose reference range applies only to samples taken after fasting for at least 8 hours.   Glucose-Capillary 01/28/2022 110 (H)  70 - 99 mg/dL Final   Glucose reference range applies only to samples taken after fasting for at least 8 hours.   Glucose-Capillary 01/28/2022 167 (H)  70 - 99 mg/dL Final   Glucose reference range applies only to samples taken after fasting for at least 8 hours.    Glucose-Capillary 01/29/2022 145 (H)  70 -  99 mg/dL Final   Glucose reference range applies only to samples taken after fasting for at least 8 hours.   Glucose-Capillary 01/29/2022 203 (H)  70 - 99 mg/dL Final   Glucose reference range applies only to samples taken after fasting for at least 8 hours.   Glucose-Capillary 01/29/2022 153 (H)  70 - 99 mg/dL Final   Glucose reference range applies only to samples taken after fasting for at least 8 hours.   Glucose-Capillary 01/29/2022 166 (H)  70 - 99 mg/dL Final   Glucose reference range applies only to samples taken after fasting for at least 8 hours.   Glucose-Capillary 01/30/2022 142 (H)  70 - 99 mg/dL Final   Glucose reference range applies only to samples taken after fasting for at least 8 hours.   Glucose-Capillary 01/30/2022 138 (H)  70 - 99 mg/dL Final   Glucose reference range applies only to samples taken after fasting for at least 8 hours.   Glucose-Capillary 01/30/2022 185 (H)  70 - 99 mg/dL Final   Glucose reference range applies only to samples taken after fasting for at least 8 hours.   Glucose-Capillary 01/30/2022 220 (H)  70 - 99 mg/dL Final   Glucose reference range applies only to samples taken after fasting for at least 8 hours.   Glucose-Capillary 01/31/2022 147 (H)  70 - 99 mg/dL Final   Glucose reference range applies only to samples taken after fasting for at least 8 hours.   Glucose-Capillary 01/31/2022 131 (H)  70 - 99 mg/dL Final   Glucose reference range applies only to samples taken after fasting for at least 8 hours.   Glucose-Capillary 01/31/2022 169 (H)  70 - 99 mg/dL Final   Glucose reference range applies only to samples taken after fasting for at least 8 hours.   Glucose-Capillary 01/31/2022 144 (H)  70 - 99 mg/dL Final   Glucose reference range applies only to samples taken after fasting for at least 8 hours.   Comment 1 01/31/2022 RN AWARE   Final   Glucose-Capillary 02/01/2022 117 (H)  70 - 99 mg/dL Final    Glucose reference range applies only to samples taken after fasting for at least 8 hours.   Glucose-Capillary 02/01/2022 180 (H)  70 - 99 mg/dL Final   Glucose reference range applies only to samples taken after fasting for at least 8 hours.   Glucose-Capillary 02/01/2022 209 (H)  70 - 99 mg/dL Final   Glucose reference range applies only to samples taken after fasting for at least 8 hours.   Glucose-Capillary 02/02/2022 131 (H)  70 - 99 mg/dL Final   Glucose reference range applies only to samples taken after fasting for at least 8 hours.   WBC 02/02/2022 7.5  4.0 - 10.5 K/uL Final   RBC 02/02/2022 4.52  3.87 - 5.11 MIL/uL Final   Hemoglobin 02/02/2022 11.3 (L)  12.0 - 15.0 g/dL Final   HCT 02/02/2022 35.0 (L)  36.0 - 46.0 % Final   MCV 02/02/2022 77.4 (L)  80.0 - 100.0 fL Final   MCH 02/02/2022 25.0 (L)  26.0 - 34.0 pg Final   MCHC 02/02/2022 32.3  30.0 - 36.0 g/dL Final   RDW 02/02/2022 16.7 (H)  11.5 - 15.5 % Final   Platelets 02/02/2022 345  150 - 400 K/uL Final   nRBC 02/02/2022 0.0  0.0 - 0.2 % Final   Neutrophils Relative % 02/02/2022 63  % Final   Neutro Abs 02/02/2022 4.7  1.7 -  7.7 K/uL Final   Lymphocytes Relative 02/02/2022 28  % Final   Lymphs Abs 02/02/2022 2.1  0.7 - 4.0 K/uL Final   Monocytes Relative 02/02/2022 7  % Final   Monocytes Absolute 02/02/2022 0.5  0.1 - 1.0 K/uL Final   Eosinophils Relative 02/02/2022 1  % Final   Eosinophils Absolute 02/02/2022 0.1  0.0 - 0.5 K/uL Final   Basophils Relative 02/02/2022 1  % Final   Basophils Absolute 02/02/2022 0.1  0.0 - 0.1 K/uL Final   Immature Granulocytes 02/02/2022 0  % Final   Abs Immature Granulocytes 02/02/2022 0.03  0.00 - 0.07 K/uL Final   Performed at [facility] [address], [place], [place] [address]   Glucose-Capillary 02/02/2022 95  70 - 99 mg/dL Final   Glucose reference range applies only to samples taken after fasting for at least 8 hours.   Glucose-Capillary 02/02/2022 120 (H)  70 - 99 mg/dL  Final   Glucose reference range applies only to samples taken after fasting for at least 8 hours.   Glucose-Capillary 02/02/2022 191 (H)  70 - 99 mg/dL Final   Glucose reference range applies only to samples taken after fasting for at least 8 hours.   Glucose-Capillary 02/03/2022 148 (H)  70 - 99 mg/dL Final   Glucose reference range applies only to samples taken after fasting for at least 8 hours.   Glucose-Capillary 02/03/2022 122 (H)  70 - 99 mg/dL Final   Glucose reference range applies only to samples taken after fasting for at least 8 hours.   Glucose-Capillary 02/03/2022 233 (H)  70 - 99 mg/dL Final   Glucose reference range applies only to samples taken after fasting for at least 8 hours.   Glucose-Capillary 02/04/2022 126 (H)  70 - 99 mg/dL Final   Glucose reference range applies only to samples taken after fasting for at least 8 hours.   Glucose-Capillary 02/04/2022 117 (H)  70 - 99 mg/dL Final   Glucose reference range applies only to samples taken after fasting for at least 8 hours.   Glucose-Capillary 02/04/2022 135 (H)  70 - 99 mg/dL Final   Glucose reference range applies only to samples taken after fasting for at least 8 hours.   Glucose-Capillary 02/04/2022 197 (H)  70 - 99 mg/dL Final   Glucose reference range applies only to samples taken after fasting for at least 8 hours.   Glucose-Capillary 02/05/2022 170 (H)  70 - 99 mg/dL Final   Glucose reference range applies only to samples taken after fasting for at least 8 hours.   Glucose-Capillary 02/05/2022 107 (H)  70 - 99 mg/dL Final   Glucose reference range applies only to samples taken after fasting for at least 8 hours.   Glucose-Capillary 02/05/2022 184 (H)  70 - 99 mg/dL Final   Glucose reference range applies only to samples taken after fasting for at least 8 hours.   Glucose-Capillary 02/05/2022 256 (H)  70 - 99 mg/dL Final   Glucose reference range applies only to samples taken after fasting for at least 8 hours.    Glucose-Capillary 02/06/2022 144 (H)  70 - 99 mg/dL Final   Glucose reference range applies only to samples taken after fasting for at least 8 hours.   Glucose-Capillary 02/06/2022 190 (H)  70 - 99 mg/dL Final   Glucose reference range applies only to samples taken after fasting for at least 8 hours.   Glucose-Capillary 02/06/2022 92  70 - 99 mg/dL Final  Glucose reference range applies only to samples taken after fasting for at least 8 hours.   Trichomonas 02/06/2022 Negative   Final   Bacterial Vaginitis-Urine 02/06/2022 Negative   Final   Molecular Comment 02/06/2022 For tests bacteria and/or candida, this specimen does not meet the   Final   Molecular Comment 02/06/2022 strict criteria set by the FDA. The result interpretation should be   Final   Molecular Comment 02/06/2022 considered in conjunction with the patient's clinical history.   Final   Comment 02/06/2022 Normal Reference Range Trichomonas - Negative   Final   Glucose-Capillary 02/06/2022 197 (H)  70 - 99 mg/dL Final   Glucose reference range applies only to samples taken after fasting for at least 8 hours.   Glucose-Capillary 02/07/2022 129 (H)  70 - 99 mg/dL Final   Glucose reference range applies only to samples taken after fasting for at least 8 hours.   Glucose-Capillary 02/07/2022 207 (H)  70 - 99 mg/dL Final   Glucose reference range applies only to samples taken after fasting for at least 8 hours.   Glucose-Capillary 02/07/2022 153 (H)  70 - 99 mg/dL Final   Glucose reference range applies only to samples taken after fasting for at least 8 hours.   Glucose-Capillary 02/08/2022 129 (H)  70 - 99 mg/dL Final   Glucose reference range applies only to samples taken after fasting for at least 8 hours.   Glucose-Capillary 02/08/2022 107 (H)  70 - 99 mg/dL Final   Glucose reference range applies only to samples taken after fasting for at least 8 hours.  Admission on 01/23/2022, Discharged on 01/24/2022  Component Date Value  Ref Range Status   Sodium 01/23/2022 140  135 - 145 mmol/L Final   Potassium 01/23/2022 4.4  3.5 - 5.1 mmol/L Final   Chloride 01/23/2022 101  98 - 111 mmol/L Final   CO2 01/23/2022 25  22 - 32 mmol/L Final   Glucose, Bld 01/23/2022 198 (H)  70 - 99 mg/dL Final   Glucose reference range applies only to samples taken after fasting for at least 8 hours.   BUN 01/23/2022 13  6 - 20 mg/dL Final   Creatinine, Ser 01/23/2022 0.62  0.44 - 1.00 mg/dL Final   Calcium 01/23/2022 9.3  8.9 - 10.3 mg/dL Final   GFR, Estimated 01/23/2022 >60  >60 mL/min Final   Comment: (NOTE) Calculated using the CKD-EPI Creatinine Equation (2021)    Anion gap 01/23/2022 14  5 - 15 Final   Performed at [facility], [place] [address], [place], [place] [address]   WBC 01/23/2022 5.8  4.0 - 10.5 K/uL Final   RBC 01/23/2022 4.63  3.87 - 5.11 MIL/uL Final   Hemoglobin 01/23/2022 11.7 (L)  12.0 - 15.0 g/dL Final   HCT 01/23/2022 37.4  36.0 - 46.0 % Final   MCV 01/23/2022 80.8  80.0 - 100.0 fL Final   MCH 01/23/2022 25.3 (L)  26.0 - 34.0 pg Final   MCHC 01/23/2022 31.3  30.0 - 36.0 g/dL Final   RDW 01/23/2022 17.9 (H)  11.5 - 15.5 % Final   Platelets 01/23/2022 333  150 - 400 K/uL Final   nRBC 01/23/2022 0.0  0.0 - 0.2 % Final   Performed at [facility] [address] [place], [place] [address]   Troponin I (High Sensitivity) 01/23/2022 6  <18 ng/L Final   Comment: (NOTE) Elevated high sensitivity troponin I (hsTnI) values and significant  changes across  serial measurements may suggest ACS but many other  chronic and acute conditions are known to elevate hsTnI results.  Refer to the "Links" section for chest pain algorithms and additional  guidance. Performed at [facility], [place] [address] [place], [place] [address]    Troponin I (High Sensitivity) 01/23/2022 5  <18 ng/L Final   Comment: (NOTE) Elevated high sensitivity troponin I (hsTnI) values and significant  changes across serial  measurements may suggest ACS but many other  chronic and acute conditions are known to elevate hsTnI results.  Refer to the "Links" section for chest pain algorithms and additional  guidance. Performed at [facility], [place] [address], [place], [place] [address]    SARS Coronavirus 2 by RT PCR 01/23/2022 NEGATIVE  NEGATIVE Final   Comment: (NOTE) SARS-CoV-2 target nucleic acids are NOT DETECTED.  The SARS-CoV-2 RNA is generally detectable in upper and lower respiratory specimens during the acute phase of infection. The lowest concentration of SARS-CoV-2 viral copies this assay can detect is 250 copies / mL. A negative result does not preclude SARS-CoV-2 infection and should not be used as the sole basis for treatment or other patient management decisions.  A negative result may occur with improper specimen collection / handling, submission of specimen other than nasopharyngeal swab, presence of viral mutation(s) within the areas targeted by this assay, and inadequate number of viral copies (<250 copies / mL). A negative result must be combined with clinical observations, patient history, and epidemiological information.  Fact Sheet for Patients:   https://www.patel.info/  Fact Sheet for Healthcare Providers: https://hall.com/  This test is not yet approved or                           cleared by the [place] FDA and has been authorized for detection and/or diagnosis of SARS-CoV-2 by FDA under an Emergency Use Authorization (EUA).  This EUA will remain in effect (meaning this test can be used) for the duration of the COVID-19 declaration under Section 564(b)(1) of the Act, 21 U.S.C. section 360bbb-3(b)(1), unless the authorization is terminated or revoked sooner.  Performed at [facility], [place] [address], [place], [place] [address]    B Natriuretic Peptide 01/23/2022 41.3  0.0 - 100.0 pg/mL Final   Performed at [facility] [address], [place], [place] [address]   Group A Strep by PCR 01/23/2022 NOT DETECTED  NOT DETECTED Final   Performed at [facility], [place] [address] [place], [place] [address]   Color, Urine 01/23/2022 YELLOW  YELLOW Final   APPearance 01/23/2022 CLEAR  CLEAR Final   Specific Gravity, Urine 01/23/2022 1.018  1.005 - 1.030 Final   pH 01/23/2022 7.0  5.0 - 8.0 Final   Glucose, UA 01/23/2022 NEGATIVE  NEGATIVE mg/dL Final   Hgb urine dipstick 01/23/2022 NEGATIVE  NEGATIVE Final   Bilirubin Urine 01/23/2022 NEGATIVE  NEGATIVE Final   Ketones, ur 01/23/2022 NEGATIVE  NEGATIVE mg/dL Final   Protein, ur 01/23/2022 NEGATIVE  NEGATIVE mg/dL Final   Nitrite 01/23/2022 NEGATIVE  NEGATIVE Final   Leukocytes,Ua 01/23/2022 TRACE (A)  NEGATIVE Final   RBC / HPF 01/23/2022 0-5  0 - 5 RBC/hpf Final   WBC, UA 01/23/2022 0-5  0 - 5 WBC/hpf Final   Bacteria, UA 01/23/2022 NONE SEEN  NONE SEEN Final   Squamous Epithelial / LPF 01/23/2022 0-5  0 - 5 Final  Mucus 01/23/2022 PRESENT   Final   Performed at [facility], [place] [address] [place], [place] [address]   SARS Coronavirus 2 by RT PCR 01/23/2022 NEGATIVE  NEGATIVE Final   Comment: (NOTE) SARS-CoV-2 target nucleic acids are NOT DETECTED.  The SARS-CoV-2 RNA is generally detectable in upper respiratory specimens during the acute phase of infection. The lowest concentration of SARS-CoV-2 viral copies this assay can detect is 138 copies/mL. A negative result does not preclude SARS-Cov-2 infection and should not be used as the sole basis for treatment or other patient management decisions. A negative result may occur with  improper specimen collection/handling, submission of specimen other than nasopharyngeal swab, presence of viral mutation(s) within the areas targeted by this assay, and inadequate number of viral copies(<138 copies/mL). A negative result must be combined with clinical observations, patient history, and  epidemiological information. The expected result is Negative.  Fact Sheet for Patients:  EntrepreneurPulse.com.au  Fact Sheet for Healthcare Providers:  IncredibleEmployment.be  This test is no                          t yet approved or cleared by the [place] FDA and  has been authorized for detection and/or diagnosis of SARS-CoV-2 by FDA under an Emergency Use Authorization (EUA). This EUA will remain  in effect (meaning this test can be used) for the duration of the COVID-19 declaration under Section 564(b)(1) of the Act, 21 U.S.C.section 360bbb-3(b)(1), unless the authorization is terminated  or revoked sooner.       Influenza A by PCR 01/23/2022 NEGATIVE  NEGATIVE Final   Influenza B by PCR 01/23/2022 NEGATIVE  NEGATIVE Final   Comment: (NOTE) The Xpert Xpress SARS-CoV-2/FLU/RSV plus assay is intended as an aid in the diagnosis of influenza from Nasopharyngeal swab specimens and should not be used as a sole basis for treatment. Nasal washings and aspirates are unacceptable for Xpert Xpress SARS-CoV-2/FLU/RSV testing.  Fact Sheet for Patients: EntrepreneurPulse.com.au  Fact Sheet for Healthcare Providers: IncredibleEmployment.be  This test is not yet approved or cleared by the [place] FDA and has been authorized for detection and/or diagnosis of SARS-CoV-2 by FDA under an Emergency Use Authorization (EUA). This EUA will remain in effect (meaning this test can be used) for the duration of the COVID-19 declaration under Section 564(b)(1) of the Act, 21 U.S.C. section 360bbb-3(b)(1), unless the authorization is terminated or revoked.  Performed at [facility], [place] [address] [place], [place] [address]    WBC 01/24/2022 6.1  4.0 - 10.5 K/uL Final   RBC 01/24/2022 4.60  3.87 - 5.11 MIL/uL Final   Hemoglobin 01/24/2022 11.7 (L)  12.0 - 15.0 g/dL Final   HCT 01/24/2022 37.0  36.0 -  46.0 % Final   MCV 01/24/2022 80.4  80.0 - 100.0 fL Final   MCH 01/24/2022 25.4 (L)  26.0 - 34.0 pg Final   MCHC 01/24/2022 31.6  30.0 - 36.0 g/dL Final   RDW 01/24/2022 17.6 (H)  11.5 - 15.5 % Final   Platelets 01/24/2022 334  150 - 400 K/uL Final   nRBC 01/24/2022 0.0  0.0 - 0.2 % Final   Neutrophils Relative % 01/24/2022 53  % Final   Neutro Abs 01/24/2022 3.3  1.7 - 7.7 K/uL Final   Lymphocytes Relative 01/24/2022 33  % Final   Lymphs Abs 01/24/2022 2.0  0.7 - 4.0 K/uL Final   Monocytes Relative 01/24/2022 11  %  Final   Monocytes Absolute 01/24/2022 0.7  0.1 - 1.0 K/uL Final   Eosinophils Relative 01/24/2022 2  % Final   Eosinophils Absolute 01/24/2022 0.1  0.0 - 0.5 K/uL Final   Basophils Relative 01/24/2022 1  % Final   Basophils Absolute 01/24/2022 0.1  0.0 - 0.1 K/uL Final   Immature Granulocytes 01/24/2022 0  % Final   Abs Immature Granulocytes 01/24/2022 0.02  0.00 - 0.07 K/uL Final   Performed at [facility], [place] [address], [place], [place] [address]   Glucose-Capillary 01/24/2022 110 (H)  70 - 99 mg/dL Final   Glucose reference range applies only to samples taken after fasting for at least 8 hours.  No results displayed because visit has over 200 results.    Admission on 11/22/2021, Discharged on 11/22/2021  Component Date Value Ref Range Status   Glucose-Capillary 11/22/2021 169 (H)  70 - 99 mg/dL Final   Glucose reference range applies only to samples taken after fasting for at least 8 hours.  No results displayed because visit has over 200 results.    Admission on 10/04/2021, Discharged on 10/22/2021  Component Date Value Ref Range Status   SARS Coronavirus 2 by RT PCR 10/04/2021 NEGATIVE  NEGATIVE Final   Comment: (NOTE) SARS-CoV-2 target nucleic acids are NOT DETECTED.  The SARS-CoV-2 RNA is generally detectable in upper respiratory specimens during the acute phase of infection. The lowest concentration of SARS-CoV-2 viral copies this assay can detect  is 138 copies/mL. A negative result does not preclude SARS-Cov-2 infection and should not be used as the sole basis for treatment or other patient management decisions. A negative result may occur with  improper specimen collection/handling, submission of specimen other than nasopharyngeal swab, presence of viral mutation(s) within the areas targeted by this assay, and inadequate number of viral copies(<138 copies/mL). A negative result must be combined with clinical observations, patient history, and epidemiological information. The expected result is Negative.  Fact Sheet for Patients:  EntrepreneurPulse.com.au  Fact Sheet for Healthcare Providers:  IncredibleEmployment.be  This test is no                          t yet approved or cleared by the [place] FDA and  has been authorized for detection and/or diagnosis of SARS-CoV-2 by FDA under an Emergency Use Authorization (EUA). This EUA will remain  in effect (meaning this test can be used) for the duration of the COVID-19 declaration under Section 564(b)(1) of the Act, 21 U.S.C.section 360bbb-3(b)(1), unless the authorization is terminated  or revoked sooner.       Influenza A by PCR 10/04/2021 NEGATIVE  NEGATIVE Final   Influenza B by PCR 10/04/2021 NEGATIVE  NEGATIVE Final   Comment: (NOTE) The Xpert Xpress SARS-CoV-2/FLU/RSV plus assay is intended as an aid in the diagnosis of influenza from Nasopharyngeal swab specimens and should not be used as a sole basis for treatment. Nasal washings and aspirates are unacceptable for Xpert Xpress SARS-CoV-2/FLU/RSV testing.  Fact Sheet for Patients: EntrepreneurPulse.com.au  Fact Sheet for Healthcare Providers: IncredibleEmployment.be  This test is not yet approved or cleared by the [place] FDA and has been authorized for detection and/or diagnosis of SARS-CoV-2 by FDA under an Emergency Use  Authorization (EUA). This EUA will remain in effect (meaning this test can be used) for the duration of the COVID-19 declaration under Section 564(b)(1) of the Act, 21 U.S.C. section 360bbb-3(b)(1), unless the authorization is terminated  or revoked.  Performed at [facility], [place] [address], [place], [place] [address]    WBC 10/04/2021 8.4  4.0 - 10.5 K/uL Final   RBC 10/04/2021 4.42  3.87 - 5.11 MIL/uL Final   Hemoglobin 10/04/2021 11.6 (L)  12.0 - 15.0 g/dL Final   HCT 10/04/2021 35.7 (L)  36.0 - 46.0 % Final   MCV 10/04/2021 80.8  80.0 - 100.0 fL Final   MCH 10/04/2021 26.2  26.0 - 34.0 pg Final   MCHC 10/04/2021 32.5  30.0 - 36.0 g/dL Final   RDW 10/04/2021 16.0 (H)  11.5 - 15.5 % Final   Platelets 10/04/2021 372  150 - 400 K/uL Final   nRBC 10/04/2021 0.0  0.0 - 0.2 % Final   Neutrophils Relative % 10/04/2021 68  % Final   Neutro Abs 10/04/2021 5.7  1.7 - 7.7 K/uL Final   Lymphocytes Relative 10/04/2021 22  % Final   Lymphs Abs 10/04/2021 1.8  0.7 - 4.0 K/uL Final   Monocytes Relative 10/04/2021 8  % Final   Monocytes Absolute 10/04/2021 0.7  0.1 - 1.0 K/uL Final   Eosinophils Relative 10/04/2021 1  % Final   Eosinophils Absolute 10/04/2021 0.1  0.0 - 0.5 K/uL Final   Basophils Relative 10/04/2021 1  % Final   Basophils Absolute 10/04/2021 0.1  0.0 - 0.1 K/uL Final   Immature Granulocytes 10/04/2021 0  % Final   Abs Immature Granulocytes 10/04/2021 0.03  0.00 - 0.07 K/uL Final   Performed at [facility], [place] [address], [place], [place] [address]   Sodium 10/04/2021 136  135 - 145 mmol/L Final   Potassium 10/04/2021 4.2  3.5 - 5.1 mmol/L Final   Chloride 10/04/2021 104  98 - 111 mmol/L Final   CO2 10/04/2021 25  22 - 32 mmol/L Final   Glucose, Bld 10/04/2021 100 (H)  70 - 99 mg/dL Final   Glucose reference range applies only to samples taken after fasting for at least 8 hours.   BUN 10/04/2021 9  6 - 20 mg/dL Final   Creatinine, Ser 10/04/2021 0.52  0.44 -  1.00 mg/dL Final   Calcium 10/04/2021 9.0  8.9 - 10.3 mg/dL Final   Total Protein 10/04/2021 7.0  6.5 - 8.1 g/dL Final   Albumin 10/04/2021 3.2 (L)  3.5 - 5.0 g/dL Final   AST 10/04/2021 13 (L)  15 - 41 U/L Final   ALT 10/04/2021 10  0 - 44 U/L Final   Alkaline Phosphatase 10/04/2021 61  38 - 126 U/L Final   Total Bilirubin 10/04/2021 0.3  0.3 - 1.2 mg/dL Final   GFR, Estimated 10/04/2021 >60  >60 mL/min Final   Comment: (NOTE) Calculated using the CKD-EPI Creatinine Equation (2021)    Anion gap 10/04/2021 7  5 - 15 Final   Performed at [facility] [address], [place], [place] [address]   Hgb A1c MFr Bld 10/04/2021 7.0 (H)  4.8 - 5.6 % Final   Comment: (NOTE) Pre diabetes:          5.7%-6.4%  Diabetes:              >6.4%  Glycemic control for   <7.0% adults with diabetes    Mean Plasma Glucose 10/04/2021 154.2  mg/dL Final   Performed at [facility], [address] [place], [place] [address]   Cholesterol 10/04/2021 178  0 - 200 mg/dL Final   Triglycerides 10/04/2021 155 (  H)  <150 mg/dL Final   HDL 10/04/2021 52  >40 mg/dL Final   Total CHOL/HDL Ratio 10/04/2021 3.4  RATIO Final   VLDL 10/04/2021 31  0 - 40 mg/dL Final   LDL Cholesterol 10/04/2021 95  0 - 99 mg/dL Final   Comment:        Total Cholesterol/HDL:CHD Risk Coronary Heart Disease Risk Table                     Men   Women  1/2 Average Risk   3.4   3.3  Average Risk       5.0   4.4  2 X Average Risk   9.6   7.1  3 X Average Risk  23.4   11.0        Use the calculated Patient Ratio above and the CHD Risk Table to determine the patient's CHD Risk.        ATP III CLASSIFICATION (LDL):  <100     mg/dL   Optimal  100-129  mg/dL   Near or Above                    Optimal  130-159  mg/dL   Borderline  160-189  mg/dL   High  >190     mg/dL   Very High Performed at [facility] [address], [place], [place] [address]    POC Amphetamine UR 10/04/2021 None Detected  NONE DETECTED (Cut  Off Level 1000 ng/mL) Final   POC Secobarbital (BAR) 10/04/2021 None Detected  NONE DETECTED (Cut Off Level 300 ng/mL) Final   POC Buprenorphine (BUP) 10/04/2021 None Detected  NONE DETECTED (Cut Off Level 10 ng/mL) Final   POC Oxazepam (BZO) 10/04/2021 None Detected  NONE DETECTED (Cut Off Level 300 ng/mL) Final   POC Cocaine UR 10/04/2021 None Detected  NONE DETECTED (Cut Off Level 300 ng/mL) Final   POC Methamphetamine UR 10/04/2021 None Detected  NONE DETECTED (Cut Off Level 1000 ng/mL) Final   POC Morphine 10/04/2021 None Detected  NONE DETECTED (Cut Off Level 300 ng/mL) Final   POC Methadone UR 10/04/2021 None Detected  NONE DETECTED (Cut Off Level 300 ng/mL) Final   POC Oxycodone UR 10/04/2021 Positive (A)  NONE DETECTED (Cut Off Level 100 ng/mL) Final   POC Marijuana UR 10/04/2021 None Detected  NONE DETECTED (Cut Off Level 50 ng/mL) Final   SARSCOV2ONAVIRUS 2 AG 10/04/2021 NEGATIVE  NEGATIVE Final   Comment: (NOTE) SARS-CoV-2 antigen NOT DETECTED.   Negative results are presumptive.  Negative results do not preclude SARS-CoV-2 infection and should not be used as the sole basis for treatment or other patient management decisions, including infection  control decisions, particularly in the presence of clinical signs and  symptoms consistent with COVID-19, or in those who have been in contact with the virus.  Negative results must be combined with clinical observations, patient history, and epidemiological information. The expected result is Negative.  Fact Sheet for Patients: HandmadeRecipes.com.cy  Fact Sheet for Healthcare Providers: FuneralLife.at  This test is not yet approved or cleared by the [place] FDA and  has been authorized for detection and/or diagnosis of SARS-CoV-2 by FDA under an Emergency Use Authorization (EUA).  This EUA will remain in effect (meaning this test can be used) for the duration of  the COV  ID-19 declaration under Section 564(b)(1) of the Act, 21 U.S.C. section 360bbb-3(b)(1), unless the authorization is terminated or revoked sooner.     Valproic Acid Lvl 10/04/2021 51  50.0 - 100.0 ug/mL Final   Performed at [facility] [address], [place], [place] [address]   Valproic Acid Lvl 10/08/2021 57  50.0 - 100.0 ug/mL Final   Performed at [facility] [address] [place],  [address]   Glucose-Capillary 10/09/2021 123 (H)  70 - 99 mg/dL Final   Glucose reference range applies only to samples taken after fasting for at least 8 hours.  Admission on 09/09/2021, Discharged on 09/10/2021  Component Date Value Ref Range Status   Sodium 09/09/2021 134 (L)  135 - 145 mmol/L Final   Potassium 09/09/2021 4.3  3.5 - 5.1 mmol/L Final   Chloride 09/09/2021 99  98 - 111 mmol/L Final   CO2 09/09/2021 25  22 - 32 mmol/L Final   Glucose, Bld 09/09/2021 107 (H)  70 - 99 mg/dL Final   Glucose reference range applies only to samples taken after fasting for at least 8 hours.   BUN 09/09/2021 12  6 - 20 mg/dL Final   Creatinine, Ser 09/09/2021 0.74  0.44 - 1.00 mg/dL Final   Calcium 09/09/2021 9.0  8.9 - 10.3 mg/dL Final   Total Protein 09/09/2021 7.4  6.5 - 8.1 g/dL Final   Albumin 09/09/2021 3.1 (L)  3.5 - 5.0 g/dL Final   AST 09/09/2021 13 (L)  15 - 41 U/L Final   ALT 09/09/2021 13  0 - 44 U/L Final   Alkaline Phosphatase 09/09/2021 66  38 - 126 U/L Final   Total Bilirubin 09/09/2021 0.2 (L)  0.3 - 1.2 mg/dL Final   GFR, Estimated 09/09/2021 >60  >60 mL/min Final   Comment: (NOTE) Calculated using the CKD-EPI Creatinine Equation (2021)    Anion gap 09/09/2021 10  5 - 15 Final   Performed at [facility], [place] [address], [place], [place] [address]   WBC 09/09/2021 8.5  4.0 - 10.5 K/uL Final   RBC 09/09/2021 4.53  3.87 - 5.11 MIL/uL Final   Hemoglobin 09/09/2021 11.8 (L)  12.0 - 15.0 g/dL Final   HCT 09/09/2021 38.0  36.0 - 46.0 % Final    MCV 09/09/2021 83.9  80.0 - 100.0 fL Final   MCH 09/09/2021 26.0  26.0 - 34.0 pg Final   MCHC 09/09/2021 31.1  30.0 - 36.0 g/dL Final   RDW 09/09/2021 17.8 (H)  11.5 - 15.5 % Final   Platelets 09/09/2021 442 (H)  150 - 400 K/uL Final   nRBC 09/09/2021 0.0  0.0 - 0.2 % Final   Neutrophils Relative % 09/09/2021 70  % Final   Neutro Abs 09/09/2021 5.9  1.7 - 7.7 K/uL Final   Lymphocytes Relative 09/09/2021 20  % Final   Lymphs Abs 09/09/2021 1.7  0.7 - 4.0 K/uL Final   Monocytes Relative 09/09/2021 8  % Final   Monocytes Absolute 09/09/2021 0.7  0.1 - 1.0 K/uL Final   Eosinophils Relative 09/09/2021 1  % Final   Eosinophils Absolute 09/09/2021 0.1  0.0 - 0.5 K/uL Final   Basophils Relative 09/09/2021 1  % Final   Basophils Absolute 09/09/2021 0.1  0.0 - 0.1 K/uL Final   Immature Granulocytes 09/09/2021 0  % Final   Abs Immature Granulocytes 09/09/2021 0.03  0.00 - 0.07 K/uL Final   Performed at [facility],  [address] [place] [address], [place], [place] [address]   Ammonia 09/09/2021 37 (H)  9 - 35 umol/L Final   Comment: HEMOLYSIS AT THIS LEVEL MAY AFFECT RESULT Performed at [facility], [place] [address] [place], [place] [address]    Opiates 09/09/2021 NONE DETECTED  NONE DETECTED Final   Cocaine 09/09/2021 NONE DETECTED  NONE DETECTED Final   Benzodiazepines 09/09/2021 NONE DETECTED  NONE DETECTED Final   Amphetamines 09/09/2021 NONE DETECTED  NONE DETECTED Final   Tetrahydrocannabinol 09/09/2021 NONE DETECTED  NONE DETECTED Final   Barbiturates 09/09/2021 NONE DETECTED  NONE DETECTED Final   Comment: (NOTE) DRUG SCREEN FOR MEDICAL PURPOSES ONLY.  IF CONFIRMATION IS NEEDED FOR ANY PURPOSE, NOTIFY LAB WITHIN 5 DAYS.  LOWEST DETECTABLE LIMITS FOR URINE DRUG SCREEN Drug Class                     Cutoff (ng/mL) Amphetamine and metabolites    1000 Barbiturate and metabolites    200 Benzodiazepine                 630 Tricyclics and metabolites     300 Opiates and metabolites         300 Cocaine and metabolites        300 THC                            50 Performed at [facility], [place] [address], [place], [place] [address]    Alcohol, Ethyl (B) 09/09/2021 <10  <10 mg/dL Final   Comment: (NOTE) Lowest detectable limit for serum alcohol is 10 mg/dL.  For medical purposes only. Performed at [facility], [place] [address] [place], [place] [address]    Lipase 09/09/2021 33  11 - 51 U/L Final   Performed at [facility] [address], [place], [place] [address]   Color, Urine 09/09/2021 COLORLESS (A)  YELLOW Final   APPearance 09/09/2021 CLEAR  CLEAR Final   Specific Gravity, Urine 09/09/2021 1.002 (L)  1.005 - 1.030 Final   pH 09/09/2021 6.0  5.0 - 8.0 Final   Glucose, UA 09/09/2021 NEGATIVE  NEGATIVE mg/dL Final   Hgb urine dipstick 09/09/2021 NEGATIVE  NEGATIVE Final   Bilirubin Urine 09/09/2021 NEGATIVE  NEGATIVE Final   Ketones, ur 09/09/2021 NEGATIVE  NEGATIVE mg/dL Final   Protein, ur 09/09/2021 NEGATIVE  NEGATIVE mg/dL Final   Nitrite 09/09/2021 NEGATIVE  NEGATIVE Final   Leukocytes,Ua 09/09/2021 NEGATIVE  NEGATIVE Final   Performed at [facility] [address], [place], [place] [address]   Specimen Description 09/09/2021 URINE, CLEAN CATCH   Final   Special Requests 09/09/2021 NONE   Final   Culture 09/09/2021  (A)   Final                   Value:<10,000 COLONIES/mL INSIGNIFICANT GROWTH Performed at [facility], [place] [address] [place], [place] [address]    Report Status 09/09/2021 09/10/2021 FINAL   Final   D-Dimer, Quant 09/09/2021 0.92 (H)  0.00 - 0.50 ug/mL-FEU Final   Comment: (NOTE) At the manufacturer cut-off value of 0.5 g/mL FEU, this assay has a negative predictive value of 95-100%.This assay is intended for use in conjunction with a clinical pretest probability (PTP) assessment model to exclude pulmonary embolism (PE) and deep venous thrombosis (DVT) in outpatients suspected of PE or DVT. Results should  be  correlated with clinical presentation. Performed at [facility], [place] [address] [place], [place] [address]    Troponin I (High Sensitivity) 09/09/2021 5  <18 ng/L Final   Comment: (NOTE) Elevated high sensitivity troponin I (hsTnI) values and significant  changes across serial measurements may suggest ACS but many other  chronic and acute conditions are known to elevate hsTnI results.  Refer to the "Links" section for chest pain algorithms and additional  guidance. Performed at [facility], [place] [address], [place], [place] [address]    BP 09/10/2021 135/83  mmHg Final   S' Lateral 09/10/2021 2.30  cm Final   Area-P 1/2 09/10/2021 5.58  cm2 Final    Blood Alcohol level:  Lab Results  Component Value Date   ETH <10 11/23/2021   ETH <10 54/00/8676    Metabolic Disorder Labs: Lab Results  Component Value Date   HGBA1C 6.7 (H) 12/28/2021   MPG 145.59 12/28/2021   MPG 154.2 10/04/2021   Lab Results  Component Value Date   PROLACTIN 3.8 (L) 11/23/2021   Lab Results  Component Value Date   CHOL 171 11/23/2021   TRIG 125 11/23/2021   HDL 65 11/23/2021   CHOLHDL 2.6 11/23/2021   VLDL 25 11/23/2021   LDLCALC 81 11/23/2021   LDLCALC 95 10/04/2021    Therapeutic Lab Levels: No results found for: "LITHIUM" Lab Results  Component Value Date   VALPROATE 33 (L) 01/18/2022   VALPROATE 33 (L) 12/28/2021   No results found for: "CBMZ"  Physical Findings   PHQ2-9    Flowsheet Row ED from 11/23/2021 in [facility]  PHQ-2 Total Score 2  PHQ-9 Total Score 3      Flowsheet Row ED from 02/09/2022 in [facility] ED from 02/08/2022 in [facility] ED from 01/24/2022 in [facility] No Risk No Risk No Risk        Musculoskeletal  Strength & Muscle Tone: within normal limits Gait & Station: normal Patient leans:  N/A  Psychiatric Specialty Exam  Presentation  General Appearance:  Casual; Disheveled  Eye Contact: Fair  Speech: Clear and Coherent; Normal Rate  Speech Volume: Normal  Handedness: Right   Mood and Affect  Mood: Euthymic  Affect: Constricted; Flat   Thought Process  Thought Processes: Coherent; Goal Directed; Linear  Descriptions of Associations:Intact  Orientation:Full (Time, Place and Person)  Thought Content:Logical  Diagnosis of Schizophrenia or Schizoaffective disorder in past: Yes  Duration of Psychotic Symptoms: No data recorded  Hallucinations:Hallucinations: None  Ideas of Reference:None  Suicidal Thoughts:Suicidal Thoughts: No  Homicidal Thoughts:Homicidal Thoughts: No   Sensorium  Memory: Immediate Good; Recent Good; Remote Good  Judgment: Fair  Insight: Fair   Materials engineer: Fair  Attention Span: Fair  Recall: [employer] of Knowledge: Fair  Language: Fair   Psychomotor Activity  Psychomotor Activity: Psychomotor Activity: Normal   Assets  Assets: Communication Skills; Desire for Improvement; Financial Resources/Insurance; Resilience   Sleep  Sleep: Sleep: Good   No data recorded  Physical Exam  Physical Exam Constitutional:      General: She is not in acute distress.    Appearance: She is not ill-appearing, toxic-appearing or diaphoretic.  Eyes:     General: No scleral icterus. Cardiovascular:     Rate and Rhythm: Tachycardia present.  Pulmonary:     Effort: Pulmonary effort is normal. No respiratory  distress.  Neurological:     Mental Status: She is alert and oriented to person, place, and time.  Psychiatric:        Attention and Perception: Attention and perception normal.        Mood and Affect: Mood normal. Affect is flat.        Speech: Speech normal.        Behavior: Behavior normal. Behavior is cooperative.        Thought Content: Thought content normal.     Review of Systems  Constitutional:  Negative for chills and fever.  Respiratory:  Negative for shortness of breath.   Cardiovascular:  Negative for chest pain and palpitations.  Gastrointestinal:  Negative for abdominal pain.  Neurological:  Negative for headaches.   Blood pressure 134/80, pulse (!) 103, temperature 97.7 F (36.5 C), temperature source Oral, resp. rate 18, SpO2 96 %. There is no height or weight on file to calculate BMI.  Treatment Plan Summary: Plan Pt remains psychiatrically cleared. Disposition is pending placement.  Tharon Aquas, NP 02/17/2022 9:41 AM

## 2022-02-17 NOTE — ED Provider Triage Note (Signed)
Emergency Medicine Provider Triage Evaluation Note  Teresa Murillo , a 61 y.o. female  was evaluated in triage.  Pt complains of chest pain.  Is been intermittent for "some time".  Associate with shortness of breath.  Unable to defy provoking or alleviating factors.  Sharp.  She states she thinks she supposed to be on a blood thinner but unsure what medication she takes.  Requesting speak to social work due to getting kicked out of shelter..  Review of Systems  Per HPI  Physical Exam  BP (!) 142/92   Pulse (!) 119   Temp 97.9 F (36.6 C) (Oral)   Resp 16   Ht 5\' 2"  (1.575 m)   Wt 80.7 kg   SpO2 95%   BMI 32.56 kg/m  Gen:   Awake, no distress   Resp:  Normal effort  MSK:   Moves extremities without difficulty  Other:  No lower extremity edema, tachycardic  Medical Decision Making  Medically screening exam initiated at 1:17 PM.  Appropriate orders placed.  Jermya Dowding was informed that the remainder of the evaluation will be completed by another provider, this initial triage assessment does not replace that evaluation, and the importance of remaining in the ED until their evaluation is complete.     [name], PA-C 02/17/22 1317

## 2022-02-17 NOTE — Progress Notes (Signed)
Homeless resources are attached to patients AVS. TOC will follow-up with patient when she transitions to a room.

## 2022-02-17 NOTE — ED Notes (Signed)
Patient observed/assessed in bed/chair resting quietly appearing in no distress and verbalizing no complaints at this time. Will continue to monitor.  

## 2022-02-18 ENCOUNTER — Ambulatory Visit (HOSPITAL_COMMUNITY)
Admission: EM | Admit: 2022-02-18 | Discharge: 2022-03-15 | Disposition: A | Discharge status: Home / Self Care | Payer: No Typology Code available for payment source | Attending: Family | Admitting: Family

## 2022-02-18 ENCOUNTER — Other Ambulatory Visit: Payer: Self-pay

## 2022-02-18 DIAGNOSIS — F209 Schizophrenia, unspecified: Secondary | ICD-10-CM

## 2022-02-18 DIAGNOSIS — J449 Chronic obstructive pulmonary disease, unspecified: Secondary | ICD-10-CM | POA: Insufficient documentation

## 2022-02-18 DIAGNOSIS — E119 Type 2 diabetes mellitus without complications: Secondary | ICD-10-CM | POA: Insufficient documentation

## 2022-02-18 DIAGNOSIS — F259 Schizoaffective disorder, unspecified: Secondary | ICD-10-CM | POA: Insufficient documentation

## 2022-02-18 LAB — TROPONIN I (HIGH SENSITIVITY): Troponin I (High Sensitivity): 4 ng/L (ref <18)

## 2022-02-18 LAB — GLUCOSE, CAPILLARY
Glucose-Capillary: 254 mg/dL — ABNORMAL HIGH (ref 70–99)
Glucose-Capillary: 112 mg/dL — ABNORMAL HIGH (ref 70–99)

## 2022-02-18 LAB — D-DIMER, QUANTITATIVE: D-Dimer, Quant: 0.29 ug/mL-FEU (ref 0.00–0.50)

## 2022-02-18 MED ORDER — MAGNESIUM HYDROXIDE 400 MG/5ML PO SUSP: 30.0000 mL | Freq: Every day | ORAL | Status: DC | PRN

## 2022-02-18 MED ORDER — HALOPERIDOL 5 MG PO TABS
10.0000 mg | ORAL_TABLET | Freq: Three times a day (TID) | ORAL | Status: DC | PRN
  Filled 2022-02-18 (×2): qty 20

## 2022-02-18 MED ORDER — ARIPIPRAZOLE 10 MG PO TABS
10.0000 mg | ORAL_TABLET | Freq: Every day | ORAL | Status: DC
  Administered 2022-02-18 – 2022-03-15 (×26): 10 mg via ORAL
  Filled 2022-02-18 (×5): qty 1
  Filled 2022-02-18: qty 7
  Filled 2022-02-18 (×21): qty 1

## 2022-02-18 MED ORDER — ALBUTEROL SULFATE HFA 108 (90 BASE) MCG/ACT IN AERS
2.0000 | INHALATION_SPRAY | Freq: Four times a day (QID) | RESPIRATORY_TRACT | Status: DC | PRN
  Administered 2022-02-20: 2 via RESPIRATORY_TRACT

## 2022-02-18 MED ORDER — GLUCOSE BLOOD VI STRP: 1.0000 | ORAL_STRIP | Freq: Four times a day (QID) | Status: DC | PRN

## 2022-02-18 MED ORDER — INSULIN GLARGINE-YFGN 100 UNIT/ML ~~LOC~~ SOLN
12.0000 [IU] | Freq: Every day | SUBCUTANEOUS | Status: DC
  Administered 2022-02-18 – 2022-03-15 (×26): 12 [IU] via SUBCUTANEOUS
  Filled 2022-02-18: qty 10

## 2022-02-18 MED ORDER — DILTIAZEM HCL ER COATED BEADS 120 MG PO CP24
240.0000 mg | ORAL_CAPSULE | Freq: Every day | ORAL | Status: DC
  Administered 2022-02-18 – 2022-03-15 (×26): 240 mg via ORAL
  Filled 2022-02-18 (×5): qty 2
  Filled 2022-02-18: qty 14
  Filled 2022-02-18 (×21): qty 2

## 2022-02-18 MED ORDER — COLCHICINE 0.6 MG PO TABS
0.6000 mg | ORAL_TABLET | Freq: Every day | ORAL | Status: DC
  Administered 2022-02-18 – 2022-03-14 (×25): 0.6 mg via ORAL
  Filled 2022-02-18 (×13): qty 1
  Filled 2022-02-18: qty 7
  Filled 2022-02-18 (×12): qty 1

## 2022-02-18 MED ORDER — LATANOPROST 0.005 % OP SOLN
1.0000 [drp] | Freq: Every day | OPHTHALMIC | Status: DC
  Administered 2022-02-18 – 2022-03-13 (×21): 1 [drp] via OPHTHALMIC
  Filled 2022-02-18 (×2): qty 2.5

## 2022-02-18 MED ORDER — INSULIN PEN NEEDLE 32G X 4 MM MISC: Freq: Four times a day (QID) | Status: DC

## 2022-02-18 MED ORDER — DIVALPROEX SODIUM ER 500 MG PO TB24
500.0000 mg | ORAL_TABLET | Freq: Two times a day (BID) | ORAL | Status: DC
  Administered 2022-02-18 – 2022-03-15 (×51): 500 mg via ORAL
  Filled 2022-02-18 (×2): qty 1
  Filled 2022-02-18: qty 14
  Filled 2022-02-18 (×49): qty 1

## 2022-02-18 MED ORDER — VALBENAZINE TOSYLATE 40 MG PO CAPS
40.0000 mg | ORAL_CAPSULE | Freq: Every morning | ORAL | Status: DC
  Administered 2022-02-18 – 2022-03-15 (×26): 40 mg via ORAL
  Filled 2022-02-18: qty 7
  Filled 2022-02-18 (×26): qty 1

## 2022-02-18 MED ORDER — METFORMIN HCL 500 MG PO TABS
500.0000 mg | ORAL_TABLET | Freq: Two times a day (BID) | ORAL | Status: DC
  Administered 2022-02-18 – 2022-03-15 (×51): 500 mg via ORAL
  Filled 2022-02-18 (×10): qty 1
  Filled 2022-02-18: qty 14
  Filled 2022-02-18 (×41): qty 1

## 2022-02-18 MED ORDER — ALUM & MAG HYDROXIDE-SIMETH 200-200-20 MG/5ML PO SUSP
30.0000 mL | ORAL | Status: DC | PRN
  Administered 2022-02-19 – 2022-03-15 (×7): 30 mL via ORAL
  Filled 2022-02-18 (×7): qty 30

## 2022-02-18 MED ORDER — APIXABAN 5 MG PO TABS
5.0000 mg | ORAL_TABLET | Freq: Two times a day (BID) | ORAL | Status: DC
  Administered 2022-02-18 – 2022-03-15 (×51): 5 mg via ORAL
  Filled 2022-02-18 (×6): qty 1
  Filled 2022-02-18: qty 14
  Filled 2022-02-18 (×47): qty 1

## 2022-02-18 MED ORDER — ACETAMINOPHEN 325 MG PO TABS
650.0000 mg | ORAL_TABLET | Freq: Four times a day (QID) | ORAL | Status: DC | PRN
  Administered 2022-02-18 – 2022-03-13 (×11): 650 mg via ORAL
  Filled 2022-02-18 (×11): qty 2

## 2022-02-18 MED ORDER — ACCU-CHEK SOFTCLIX LANCETS MISC: Freq: Four times a day (QID) | Status: DC

## 2022-02-18 MED ORDER — ACCU-CHEK GUIDE W/DEVICE KIT: PACK | Freq: Four times a day (QID) | Status: DC

## 2022-02-18 MED ORDER — MOMETASONE FURO-FORMOTEROL FUM 200-5 MCG/ACT IN AERO
2.0000 | INHALATION_SPRAY | Freq: Two times a day (BID) | RESPIRATORY_TRACT | Status: DC
  Administered 2022-02-18 – 2022-03-15 (×48): 2 via RESPIRATORY_TRACT
  Filled 2022-02-18 (×3): qty 8.8

## 2022-02-18 MED ORDER — NICOTINE 7 MG/24HR TD PT24
7.0000 mg | MEDICATED_PATCH | Freq: Every day | TRANSDERMAL | Status: DC
  Administered 2022-02-18 – 2022-03-15 (×26): 7 mg via TRANSDERMAL
  Filled 2022-02-18: qty 1
  Filled 2022-02-18: qty 7
  Filled 2022-02-18 (×4): qty 1
  Filled 2022-02-18: qty 7
  Filled 2022-02-18 (×21): qty 1

## 2022-02-18 MED ORDER — CLOZAPINE 25 MG PO TABS
75.0000 mg | ORAL_TABLET | Freq: Two times a day (BID) | ORAL | Status: DC
  Administered 2022-02-18 – 2022-03-15 (×51): 75 mg via ORAL
  Filled 2022-02-18 (×42): qty 3
  Filled 2022-02-18: qty 42
  Filled 2022-02-18 (×10): qty 3

## 2022-02-18 MED ORDER — CLOZAPINE 25 MG PO TABS: 50.0000 mg | ORAL_TABLET | Freq: Every day | ORAL | Status: DC

## 2022-02-18 MED ORDER — VITAMIN D (ERGOCALCIFEROL) 1.25 MG (50000 UNIT) PO CAPS
50000.0000 [IU] | ORAL_CAPSULE | ORAL | Status: DC
  Administered 2022-02-18 – 2022-03-11 (×4): 50000 [IU] via ORAL
  Filled 2022-02-18 (×6): qty 1

## 2022-02-18 NOTE — ED Notes (Signed)
Patient pleasant today, given breakfast, took her medications, denies chest pain today, denies SI/HI/AVH, contracts for safety, denies any physical complaints at present.

## 2022-02-18 NOTE — ED Notes (Signed)
Pt A&O x 4, transfer from [facility] for evaluation of chest pain.  Pt denies pain at present.   Denies SI, HI or AVH.  Comfort measures given.  Monitoring for safety.

## 2022-02-18 NOTE — ED Provider Notes (Signed)
[facility] EMERGENCY DEPARTMENT Provider Note   CSN: [id number] Arrival date & time: 02/17/22  1251     History  Chief Complaint  Patient presents with   Chest Pain   Social Work Consult    Teresa Murillo is a 61 y.o. female.  The history is provided by the patient and medical records.  Chest Pain  61 y.o. F with of history of COPD, borderline intellectual functioning, atrial flutter, hypothyroidism, schizophrenia, DM 2, presenting to the ED with chest pain. States has been intermittent for "a while".  States it just feels uncomfortable.  Denies SOB, cough, fever, chills.  Was also recently kicked out of group home so has been boarding at [facility] while awaiting placement.  She requested to speak to social worker which was done earlier today.  She has no complaints at present, mostly seems concerned with more permanent housing.    Home Medications Prior to Admission medications   Medication Sig Start Date End Date Taking? Authorizing Provider  Accu-Chek Softclix Lancets lancets Use as directed up to four times daily 11/16/21   Thurnell Lose, MD  apixaban (ELIQUIS) 5 MG TABS tablet Take 1 tablet (5 mg total) by mouth 2 (two) times daily. 11/16/21   Thurnell Lose, MD  ARIPiprazole (ABILIFY) 10 MG tablet Take 1 tablet (10 mg total) by mouth daily. 11/17/21   Thurnell Lose, MD  Blood Glucose Monitoring Suppl (ACCU-CHEK GUIDE) w/Device KIT Use as directed up to four times daily 11/16/21   Thurnell Lose, MD  budesonide-formoterol [facility]) 160-4.5 MCG/ACT inhaler Inhale 2 puffs into the lungs in the morning and at bedtime. 01/08/21   [provider]  Cholecalciferol (VITAMIN D3) 1.25 MG (50000 UT) CAPS Take 50,000 Units by mouth every Thursday.    [provider]  clozapine (CLOZARIL) 50 MG tablet Take 1 tablet (50 mg total) by mouth daily. 11/17/21   Thurnell Lose, MD  colchicine 0.6 MG tablet Take 1 tablet (0.6 mg total) by mouth at  bedtime. 02/09/22 03/11/22  Delene Ruffini, MD  diltiazem (CARDIZEM CD) 240 MG 24 hr capsule Take 1 capsule (240 mg total) by mouth daily. Patient not taking: Reported on 11/24/2021 11/17/21   Thurnell Lose, MD  divalproex (DEPAKOTE ER) 500 MG 24 hr tablet Take 1 tablet (500 mg total) by mouth 2 (two) times daily. 11/16/21   Thurnell Lose, MD  glucose blood test strip Use as directed up to four times daily 11/16/21   Thurnell Lose, MD  haloperidol (HALDOL) 10 MG tablet Take 1 tablet (10 mg total) by mouth 3 (three) times daily as needed for agitation (and psychotic symptoms). 11/16/21   Thurnell Lose, MD  INGREZZA 40 MG capsule Take 1 capsule (40 mg total) by mouth in the morning. 11/16/21   Thurnell Lose, MD  insulin aspart (NOVOLOG) 100 UNIT/ML FlexPen Before each meal 3 times a day, 140-199 - 2 units, 200-250 - 4 units, 251-299 - 6 units,  300-349 - 8 units,  350 or above 10 units. Insulin PEN if approved, provide syringes and needles if needed.Please switch to any approved short acting Insulin if needed. 11/16/21   Thurnell Lose, MD  insulin glargine (LANTUS) 100 UNIT/ML Solostar Pen Inject 12 Units into the skin daily. 11/17/21   Thurnell Lose, MD  Insulin Pen Needle 32G X 4 MM MISC Use 4 times a day with insulin, 1 month supply. 11/16/21   Thurnell Lose, MD  latanoprost (XALATAN) 0.005 % ophthalmic solution Place 1 drop into both eyes at bedtime.    [provider]  metFORMIN (GLUCOPHAGE) 500 MG tablet Take 500 mg by mouth 2 (two) times daily with a meal.    [provider]  nicotine (NICODERM CQ - DOSED IN MG/24 HR) 7 mg/24hr patch Place 1 patch (7 mg total) onto the skin daily. 10/22/21   Corky Sox, MD  PROAIR HFA 108 [phone] Base) MCG/ACT inhaler Inhale 2 puffs into the lungs every 6 (six) hours as needed for wheezing or shortness of breath.    [provider]      Allergies    Penicillins and Penicillin g    Review of Systems    Review of Systems  Cardiovascular:  Positive for chest pain.  All other systems reviewed and are negative.   Physical Exam Updated Vital Signs BP (!) 146/81 (BP Location: Right Arm)   Pulse (!) 111   Temp 98.4 F (36.9 C) (Oral)   Resp 18   Ht 5' 2" (1.575 m)   Wt 80.7 kg   SpO2 95%   BMI 32.56 kg/m   Physical Exam Vitals and nursing note reviewed.  Constitutional:      Appearance: She is well-developed.  HENT:     Head: Normocephalic and atraumatic.  Eyes:     Conjunctiva/sclera: Conjunctivae normal.     Pupils: Pupils are equal, round, and reactive to light.  Cardiovascular:     Rate and Rhythm: Normal rate and regular rhythm.     Heart sounds: Normal heart sounds.  Pulmonary:     Effort: Pulmonary effort is normal.     Breath sounds: Normal breath sounds.  Abdominal:     General: Bowel sounds are normal.     Palpations: Abdomen is soft.  Musculoskeletal:        General: Normal range of motion.     Cervical back: Normal range of motion.  Skin:    General: Skin is warm and dry.  Neurological:     Mental Status: She is alert and oriented to person, place, and time.     ED Results / Procedures / Treatments   Labs (all labs ordered are listed, but only abnormal results are displayed) Labs Reviewed  BASIC METABOLIC PANEL - Abnormal; Notable for the following components:      Result Value   Glucose, Bld 113 (*)    All other components within normal limits  CBC - Abnormal; Notable for the following components:   Hemoglobin 10.2 (*)    HCT 33.2 (*)    MCV 78.3 (*)    MCH 24.1 (*)    RDW 17.2 (*)    All other components within normal limits  VALPROIC ACID LEVEL - Abnormal; Notable for the following components:   Valproic Acid Lvl 17 (*)    All other components within normal limits  BRAIN NATRIURETIC PEPTIDE  D-DIMER, QUANTITATIVE  TROPONIN I (HIGH SENSITIVITY)  TROPONIN I (HIGH SENSITIVITY)    EKG None  Radiology DG Chest 2 View  Result Date:  02/17/2022 CLINICAL DATA:  Chest pain EXAM: CHEST - 2 VIEW COMPARISON:  02/08/2022 FINDINGS: Gross cardiomegaly. Both lungs are clear. Disc degenerative disease of the thoracic spine. IMPRESSION: Gross cardiomegaly without acute abnormality of the lungs. Electronically Signed   By: Delanna Ahmadi M.D.   On: 02/17/2022 14:15    Procedures Procedures    Medications Ordered in ED Medications - No data to display  ED Course/  Medical Decision Making/ A&P                           Medical Decision Making Amount and/or Complexity of Data Reviewed Labs: ordered. Radiology: ordered and independent interpretation performed. ECG/medicine tests: ordered and independent interpretation performed.   61 year old female here with chest pain.  Somewhat vague in her complaints, not really giving a lot of specific details.  When talking with her, she mostly seemed concerned about establishing more permanent housing as she was recently kicked out of her group home.  She requested to speak to social work, this was done during daytime hours yesterday.  She has no active complaints.  EKG sinus tach, no acute ischemia.  Labs are reassuring including troponin x 2 and D-dimer.  Chest x-ray is clear.  At this time given reassuring workup, low suspicion for ACS, PE, dissection, other acute cardiac event.  Feel she is stable for discharge back to [facility].  They are actively working on placement.  Report called, EMTALA completed.  Final Clinical Impression(s) / ED Diagnoses Final diagnoses:  Chest pain in adult    Rx / DC Orders ED Discharge Orders     None         Larene Pickett, PA-C 02/18/22 0537    Fatima Blank, MD 02/18/22 [phone]

## 2022-02-18 NOTE — Care Management (Signed)
OBS Care Management   Writer left a message with a group home owner (Rachael Algis Greenhouse) at [employer] [phone]) as well as the [facility] Manager Denton Lank [phone]) regarding the status for discharge from the OBU Unit and the patient safely discharging to the [facility].

## 2022-02-18 NOTE — ED Notes (Signed)
Pt A&O x 4, sleeping at present, no distress noted.  Monitoring for safety. 

## 2022-02-18 NOTE — ED Provider Notes (Signed)
[facility] Continuous Assessment Admission H&P  Date: 02/18/22 Patient Name: Teresa Murillo MRN: [id number] Chief Complaint: No chief complaint on file.     Diagnoses:  Final diagnoses:  Schizophrenia, unspecified type [facility])    HPI: Teresa Murillo, 61 y.o female was transfer to the [facility] yesterday for medical clearance,  please see notes.    [facility] notes: 61 y.o. F with of history of COPD, borderline intellectual functioning, atrial flutter, hypothyroidism, schizophrenia, DM 2, presenting to the ED with chest pain. States has been intermittent for "a while".  States it just feels uncomfortable.  Denies SOB, cough, fever, chills.  Was also recently kicked out of group home so has been boarding at [facility] while awaiting placement.  She requested to speak to social worker which was done earlier today.  She has no complaints at present, mostly seems concerned with more permanent housing.      PHQ 2-9:  [facility] from 11/23/2021 in [facility]  Thoughts that you would be better off dead, or of hurting yourself in some way Not at all  PHQ-9 Total Score 3       Flowsheet Row ED from 02/17/2022 in [facility] ED from 02/09/2022 in [facility] ED from 02/08/2022 in [place] No Risk No Risk No Risk        Total Time spent with patient: 15 minutes  Musculoskeletal  Strength & Muscle Tone: within normal limits Gait & Station: normal Patient leans: N/A  Psychiatric Specialty Exam  Presentation General Appearance:  Casual; Disheveled  Eye Contact: Fair  Speech: Clear and Coherent; Normal Rate  Speech Volume: Normal  Handedness: Right   Mood and Affect  Mood: Euthymic  Affect: Constricted; Flat   Thought Process  Thought Processes: Coherent; Goal Directed; Linear  Descriptions of  Associations:Intact  Orientation:Full (Time, Place and Person)  Thought Content:Logical  Diagnosis of Schizophrenia or Schizoaffective disorder in past: Yes  Duration of Psychotic Symptoms: No data recorded Hallucinations:Hallucinations: None  Ideas of Reference:None  Suicidal Thoughts:Suicidal Thoughts: No  Homicidal Thoughts:Homicidal Thoughts: No   Sensorium  Memory: Immediate Good; Recent Good; Remote Good  Judgment: Fair  Insight: Fair   Materials engineer: Fair  Attention Span: Fair  Recall: [employer] of Knowledge: Fair  Language: Fair   Psychomotor Activity  Psychomotor Activity: Psychomotor Activity: Normal   Assets  Assets: Communication Skills; Desire for Improvement; Financial Resources/Insurance; Resilience   Sleep  Sleep: Sleep: Good   No data recorded  Physical Exam HENT:     Head: Normocephalic.     Nose: Nose normal.  Cardiovascular:     Rate and Rhythm: Normal rate.  Musculoskeletal:     Cervical back: Normal range of motion.  Skin:    General: Skin is warm.  Neurological:     General: No focal deficit present.     Mental Status: She is alert.  Psychiatric:        Mood and Affect: Mood normal.        Behavior: Behavior normal.        Thought Content: Thought content normal.        Judgment: Judgment normal.    Review of Systems  Constitutional: Negative.   HENT: Negative.    Eyes: Negative.   Respiratory: Negative.    Cardiovascular: Negative.   Gastrointestinal: Negative.   Musculoskeletal: Negative.   Skin: Negative.  Neurological: Negative.   Endo/Heme/Allergies: Negative.   Psychiatric/Behavioral:  The patient is nervous/anxious.     There were no vitals taken for this visit. There is no height or weight on file to calculate BMI.  Past Psychiatric History: schizophrenia   Is the patient at risk to self? Yes  Has the patient been a risk to self in the past 6 months? Yes .    Has  the patient been a risk to self within the distant past? Yes   Is the patient a risk to others? No   Has the patient been a risk to others in the past 6 months? No   Has the patient been a risk to others within the distant past? No   Past Medical History:  Past Medical History:  Diagnosis Date   Borderline intellectual functioning 12/14/2021   On 12/14/2021: Appreciate assistance from psychology consult. On the Wechsler Adult Intelligence Scale-4, Teresa Murillo achieved a full-scale IQ score of 73 and a percentile rank of 4 placing her in the borderline range of intellectual functioning.    Chronic obstructive pulmonary disease (COPD) (HCC)    Glaucoma    Hyperlipidemia    Hypertension    Iron deficiency    Schizoaffective disorder (HCC)    Type 2 diabetes mellitus (HCC)     Past Surgical History:  Procedure Laterality Date   TUBAL LIGATION      Family History:  Family History  Problem Relation Age of Onset   Breast cancer Maternal Grandmother     Social History:  Social History   Socioeconomic History   Marital status: Divorced    Spouse name: Not on file   Number of children: Not on file   Years of education: Not on file   Highest education level: Not on file  Occupational History   Not on file  Tobacco Use   Smoking status: Former    Packs/day: 1.00    Types: Cigars, Cigarettes    Quit date: 11/09/2021    Years since quitting: 0.2   Smokeless tobacco: Current  Vaping Use   Vaping Use: Never used  Substance and Sexual Activity   Alcohol use: Not Currently   Drug use: Not Currently   Sexual activity: Not Currently    Birth control/protection: Surgical  Other Topics Concern   Not on file  Social History Narrative   Not on file   Social Determinants of Health   Financial Resource Strain: Not on file  Food Insecurity: Not on file  Transportation Needs: Not on file  Physical Activity: Not on file  Stress: Not on file  Social Connections: Not on file   Intimate Partner Violence: Not on file    SDOH:  SDOH Screenings   Depression (PHQ2-9): Low Risk  (11/23/2021)  Tobacco Use: High Risk (02/17/2022)    Last Labs:  Admission on 02/17/2022, Discharged on 02/18/2022  Component Date Value Ref Range Status   Sodium 02/17/2022 136  135 - 145 mmol/L Final   Potassium 02/17/2022 3.9  3.5 - 5.1 mmol/L Final   Chloride 02/17/2022 101  98 - 111 mmol/L Final   CO2 02/17/2022 25  22 - 32 mmol/L Final   Glucose, Bld 02/17/2022 113 (H)  70 - 99 mg/dL Final   Glucose reference range applies only to samples taken after fasting for at least 8 hours.   BUN 02/17/2022 9  6 - 20 mg/dL Final   Creatinine, Ser 02/17/2022 0.50  0.44 - 1.00 mg/dL Final  Calcium 02/17/2022 8.9  8.9 - 10.3 mg/dL Final   GFR, Estimated 02/17/2022 >60  >60 mL/min Final   Comment: (NOTE) Calculated using the CKD-EPI Creatinine Equation (2021)    Anion gap 02/17/2022 10  5 - 15 Final   Performed at [facility], [place] [address] [place], [place] [address]   WBC 02/17/2022 6.5  4.0 - 10.5 K/uL Final   RBC 02/17/2022 4.24  3.87 - 5.11 MIL/uL Final   Hemoglobin 02/17/2022 10.2 (L)  12.0 - 15.0 g/dL Final   HCT 02/17/2022 33.2 (L)  36.0 - 46.0 % Final   MCV 02/17/2022 78.3 (L)  80.0 - 100.0 fL Final   MCH 02/17/2022 24.1 (L)  26.0 - 34.0 pg Final   MCHC 02/17/2022 30.7  30.0 - 36.0 g/dL Final   RDW 02/17/2022 17.2 (H)  11.5 - 15.5 % Final   Platelets 02/17/2022 390  150 - 400 K/uL Final   nRBC 02/17/2022 0.0  0.0 - 0.2 % Final   Performed at [facility], [place] [address] [place], [place] [address]   Troponin I (High Sensitivity) 02/17/2022 4  <18 ng/L Final   Comment: (NOTE) Elevated high sensitivity troponin I (hsTnI) values and significant  changes across serial measurements may suggest ACS but many other  chronic and acute conditions are known to elevate hsTnI results.  Refer to the "Links" section for chest pain algorithms and additional   guidance. Performed at [facility], [place] [address] [place], [place] [address]    Valproic Acid Lvl 02/17/2022 17 (L)  50.0 - 100.0 ug/mL Final   Performed at [place] [address] [place], [place] [address]   B Natriuretic Peptide 02/17/2022 30.9  0.0 - 100.0 pg/mL Final   Performed at [facility] [address], [place], [place] [address]   Troponin I (High Sensitivity) 02/18/2022 4  <18 ng/L Final   Comment: (NOTE) Elevated high sensitivity troponin I (hsTnI) values and significant  changes across serial measurements may suggest ACS but many other  chronic and acute conditions are known to elevate hsTnI results.  Refer to the "Links" section for chest pain algorithms and additional  guidance. Performed at [facility], [place] [address], [place], [place] [address]    D-Dimer, Quant 02/18/2022 0.29  0.00 - 0.50 ug/mL-FEU Final   Comment: (NOTE) At the manufacturer cut-off value of 0.5 g/mL FEU, this assay has a negative predictive value of 95-100%.This assay is intended for use in conjunction with a clinical pretest probability (PTP) assessment model to exclude pulmonary embolism (PE) and deep venous thrombosis (DVT) in outpatients suspected of PE or DVT. Results should be correlated with clinical presentation. Performed at [facility], [place] [address], [place], [place] [address]   Admission on 02/09/2022, Discharged on 02/17/2022  Component Date Value Ref Range Status   Glucose-Capillary 02/09/2022 118 (H)  70 - 99 mg/dL Final   Glucose reference range applies only to samples taken after fasting for at least 8 hours.   Glucose-Capillary 02/10/2022 148 (H)  70 - 99 mg/dL Final   Glucose reference range applies only to samples taken after fasting for at least 8 hours.   Glucose-Capillary 02/10/2022 174 (H)  70 - 99 mg/dL Final   Glucose reference range applies only to samples taken after fasting for at least 8 hours.   Glucose-Capillary  02/10/2022 128 (H)  70 - 99 mg/dL Final   Glucose reference  range applies only to samples taken after fasting for at least 8 hours.   Glucose-Capillary 02/10/2022 182 (H)  70 - 99 mg/dL Final   Glucose reference range applies only to samples taken after fasting for at least 8 hours.   Glucose-Capillary 02/11/2022 158 (H)  70 - 99 mg/dL Final   Glucose reference range applies only to samples taken after fasting for at least 8 hours.   Glucose-Capillary 02/11/2022 159 (H)  70 - 99 mg/dL Final   Glucose reference range applies only to samples taken after fasting for at least 8 hours.   Glucose-Capillary 02/11/2022 153 (H)  70 - 99 mg/dL Final   Glucose reference range applies only to samples taken after fasting for at least 8 hours.   Glucose-Capillary 02/11/2022 159 (H)  70 - 99 mg/dL Final   Glucose reference range applies only to samples taken after fasting for at least 8 hours.   Glucose-Capillary 02/11/2022 153 (H)  70 - 99 mg/dL Final   Glucose reference range applies only to samples taken after fasting for at least 8 hours.   Glucose-Capillary 02/11/2022 109 (H)  70 - 99 mg/dL Final   Glucose reference range applies only to samples taken after fasting for at least 8 hours.   Glucose-Capillary 02/11/2022 213 (H)  70 - 99 mg/dL Final   Glucose reference range applies only to samples taken after fasting for at least 8 hours.   Glucose-Capillary 02/11/2022 229 (H)  70 - 99 mg/dL Final   Glucose reference range applies only to samples taken after fasting for at least 8 hours.   Glucose-Capillary 02/12/2022 128 (H)  70 - 99 mg/dL Final   Glucose reference range applies only to samples taken after fasting for at least 8 hours.   Glucose-Capillary 02/12/2022 149 (H)  70 - 99 mg/dL Final   Glucose reference range applies only to samples taken after fasting for at least 8 hours.   Color, Urine 02/12/2022 YELLOW  YELLOW Final   APPearance 02/12/2022 CLEAR  CLEAR Final   Specific Gravity, Urine  02/12/2022 1.015  1.005 - 1.030 Final   pH 02/12/2022 5.0  5.0 - 8.0 Final   Glucose, UA 02/12/2022 NEGATIVE  NEGATIVE mg/dL Final   Hgb urine dipstick 02/12/2022 NEGATIVE  NEGATIVE Final   Bilirubin Urine 02/12/2022 NEGATIVE  NEGATIVE Final   Ketones, ur 02/12/2022 NEGATIVE  NEGATIVE mg/dL Final   Protein, ur 02/12/2022 NEGATIVE  NEGATIVE mg/dL Final   Nitrite 02/12/2022 NEGATIVE  NEGATIVE Final   Leukocytes,Ua 02/12/2022 NEGATIVE  NEGATIVE Final   Performed at [facility], [place] [address], [place], [place] [address]   Specimen Description 02/12/2022 URINE, CLEAN CATCH   Final   Special Requests 02/12/2022    Final                   Value:NONE Performed at [facility], [place] [address] [place], [place] [address]    Culture 02/12/2022 10,000 COLONIES/mL PROTEUS MIRABILIS (A)   Final   Report Status 02/12/2022 02/15/2022 FINAL   Final   Organism ID, Bacteria 02/12/2022 PROTEUS MIRABILIS (A)   Final   Glucose-Capillary 02/12/2022 128 (H)  70 - 99 mg/dL Final   Glucose reference range applies only to samples taken after fasting for at least 8 hours.   Glucose-Capillary 02/12/2022 196 (H)  70 - 99 mg/dL Final   Glucose reference range applies only to samples taken after fasting for at least 8 hours.   Glucose-Capillary  02/13/2022 168 (H)  70 - 99 mg/dL Final   Glucose reference range applies only to samples taken after fasting for at least 8 hours.   Glucose-Capillary 02/13/2022 153 (H)  70 - 99 mg/dL Final   Glucose reference range applies only to samples taken after fasting for at least 8 hours.   Glucose-Capillary 02/13/2022 134 (H)  70 - 99 mg/dL Final   Glucose reference range applies only to samples taken after fasting for at least 8 hours.   Glucose-Capillary 02/13/2022 178 (H)  70 - 99 mg/dL Final   Glucose reference range applies only to samples taken after fasting for at least 8 hours.   Glucose-Capillary 02/14/2022 156 (H)  70 - 99 mg/dL Final   Glucose reference  range applies only to samples taken after fasting for at least 8 hours.   Glucose-Capillary 02/14/2022 169 (H)  70 - 99 mg/dL Final   Glucose reference range applies only to samples taken after fasting for at least 8 hours.   Glucose-Capillary 02/14/2022 156 (H)  70 - 99 mg/dL Final   Glucose reference range applies only to samples taken after fasting for at least 8 hours.   Glucose-Capillary 02/14/2022 148 (H)  70 - 99 mg/dL Final   Glucose reference range applies only to samples taken after fasting for at least 8 hours.   Glucose-Capillary 02/15/2022 159 (H)  70 - 99 mg/dL Final   Glucose reference range applies only to samples taken after fasting for at least 8 hours.   Glucose-Capillary 02/15/2022 125 (H)  70 - 99 mg/dL Final   Glucose reference range applies only to samples taken after fasting for at least 8 hours.   Glucose-Capillary 02/15/2022 142 (H)  70 - 99 mg/dL Final   Glucose reference range applies only to samples taken after fasting for at least 8 hours.   Glucose-Capillary 02/15/2022 128 (H)  70 - 99 mg/dL Final   Glucose reference range applies only to samples taken after fasting for at least 8 hours.   Glucose-Capillary 02/16/2022 137 (H)  70 - 99 mg/dL Final   Glucose reference range applies only to samples taken after fasting for at least 8 hours.   WBC 02/16/2022 6.0  4.0 - 10.5 K/uL Final   RBC 02/16/2022 4.48  3.87 - 5.11 MIL/uL Final   Hemoglobin 02/16/2022 10.7 (L)  12.0 - 15.0 g/dL Final   HCT 02/16/2022 35.4 (L)  36.0 - 46.0 % Final   MCV 02/16/2022 79.0 (L)  80.0 - 100.0 fL Final   MCH 02/16/2022 23.9 (L)  26.0 - 34.0 pg Final   MCHC 02/16/2022 30.2  30.0 - 36.0 g/dL Final   RDW 02/16/2022 17.0 (H)  11.5 - 15.5 % Final   Platelets 02/16/2022 387  150 - 400 K/uL Final   nRBC 02/16/2022 0.0  0.0 - 0.2 % Final   Neutrophils Relative % 02/16/2022 54  % Final   Neutro Abs 02/16/2022 3.2  1.7 - 7.7 K/uL Final   Lymphocytes Relative 02/16/2022 35  % Final   Lymphs  Abs 02/16/2022 2.1  0.7 - 4.0 K/uL Final   Monocytes Relative 02/16/2022 6  % Final   Monocytes Absolute 02/16/2022 0.4  0.1 - 1.0 K/uL Final   Eosinophils Relative 02/16/2022 2  % Final   Eosinophils Absolute 02/16/2022 0.1  0.0 - 0.5 K/uL Final   Basophils Relative 02/16/2022 2  % Final   Basophils Absolute 02/16/2022 0.1  0.0 - 0.1 K/uL Final   Immature  Granulocytes 02/16/2022 1  % Final   Abs Immature Granulocytes 02/16/2022 0.04  0.00 - 0.07 K/uL Final   Performed at [facility] [address] [place], [place] [address]   Glucose-Capillary 02/16/2022 131 (H)  70 - 99 mg/dL Final   Glucose reference range applies only to samples taken after fasting for at least 8 hours.   Glucose-Capillary 02/16/2022 123 (H)  70 - 99 mg/dL Final   Glucose reference range applies only to samples taken after fasting for at least 8 hours.   Glucose-Capillary 02/16/2022 195 (H)  70 - 99 mg/dL Final   Glucose reference range applies only to samples taken after fasting for at least 8 hours.   Glucose-Capillary 02/17/2022 148 (H)  70 - 99 mg/dL Final   Glucose reference range applies only to samples taken after fasting for at least 8 hours.  Admission on 02/08/2022, Discharged on 02/09/2022  Component Date Value Ref Range Status   B Natriuretic Peptide 02/08/2022 40.2  0.0 - 100.0 pg/mL Final   Performed at [facility], [address], [place], [place] [address]   Troponin I (High Sensitivity) 02/08/2022 4  <18 ng/L Final   Comment: (NOTE) Elevated high sensitivity troponin I (hsTnI) values and significant  changes across serial measurements may suggest ACS but many other  chronic and acute conditions are known to elevate hsTnI results.  Refer to the "Links" section for chest pain algorithms and additional  guidance. Performed at [facility], [place] [address] [place], [place] [address]    WBC 02/08/2022 8.5  4.0 - 10.5 K/uL Final   RBC 02/08/2022 4.30  3.87 - 5.11 MIL/uL Final    Hemoglobin 02/08/2022 10.7 (L)  12.0 - 15.0 g/dL Final   HCT 02/08/2022 33.1 (L)  36.0 - 46.0 % Final   MCV 02/08/2022 77.0 (L)  80.0 - 100.0 fL Final   MCH 02/08/2022 24.9 (L)  26.0 - 34.0 pg Final   MCHC 02/08/2022 32.3  30.0 - 36.0 g/dL Final   RDW 02/08/2022 16.5 (H)  11.5 - 15.5 % Final   Platelets 02/08/2022 361  150 - 400 K/uL Final   nRBC 02/08/2022 0.0  0.0 - 0.2 % Final   Performed at [facility] [address], [place], [place] [address]   Sodium 02/08/2022 131 (L)  135 - 145 mmol/L Final   Potassium 02/08/2022 4.4  3.5 - 5.1 mmol/L Final   Chloride 02/08/2022 96 (L)  98 - 111 mmol/L Final   CO2 02/08/2022 25  22 - 32 mmol/L Final   Glucose, Bld 02/08/2022 126 (H)  70 - 99 mg/dL Final   Glucose reference range applies only to samples taken after fasting for at least 8 hours.   BUN 02/08/2022 11  6 - 20 mg/dL Final   Creatinine, Ser 02/08/2022 0.52  0.44 - 1.00 mg/dL Final   Calcium 02/08/2022 9.4  8.9 - 10.3 mg/dL Final   Total Protein 02/08/2022 7.5  6.5 - 8.1 g/dL Final   Albumin 02/08/2022 3.6  3.5 - 5.0 g/dL Final   AST 02/08/2022 22  15 - 41 U/L Final   ALT 02/08/2022 20  0 - 44 U/L Final   Alkaline Phosphatase 02/08/2022 67  38 - 126 U/L Final   Total Bilirubin 02/08/2022 0.1 (L)  0.3 - 1.2 mg/dL Final   GFR, Estimated 02/08/2022 >60  >60 mL/min Final   Comment: (NOTE) Calculated using the CKD-EPI Creatinine Equation (2021)  Anion gap 02/08/2022 10  5 - 15 Final   Performed at [facility], [place] [address] [place], [place] [address]   Troponin I (High Sensitivity) 02/08/2022 5  <18 ng/L Final   Comment: (NOTE) Elevated high sensitivity troponin I (hsTnI) values and significant  changes across serial measurements may suggest ACS but many other  chronic and acute conditions are known to elevate hsTnI results.  Refer to the "Links" section for chest pain algorithms and additional  guidance. Performed at [facility], [place] [address],  [place], [place] [address]    WBC 02/09/2022 7.3  4.0 - 10.5 K/uL Final   RBC 02/09/2022 4.70  3.87 - 5.11 MIL/uL Final   Hemoglobin 02/09/2022 11.4 (L)  12.0 - 15.0 g/dL Final   HCT 02/09/2022 36.6  36.0 - 46.0 % Final   MCV 02/09/2022 77.9 (L)  80.0 - 100.0 fL Final   MCH 02/09/2022 24.3 (L)  26.0 - 34.0 pg Final   MCHC 02/09/2022 31.1  30.0 - 36.0 g/dL Final   RDW 02/09/2022 16.6 (H)  11.5 - 15.5 % Final   Platelets 02/09/2022 403 (H)  150 - 400 K/uL Final   nRBC 02/09/2022 0.0  0.0 - 0.2 % Final   Performed at [facility] [address] [place], [place] [address]   Glucose-Capillary 02/08/2022 117 (H)  70 - 99 mg/dL Final   Glucose reference range applies only to samples taken after fasting for at least 8 hours.   Sodium 02/09/2022 138  135 - 145 mmol/L Final   Potassium 02/09/2022 3.7  3.5 - 5.1 mmol/L Final   Chloride 02/09/2022 103  98 - 111 mmol/L Final   CO2 02/09/2022 24  22 - 32 mmol/L Final   Glucose, Bld 02/09/2022 134 (H)  70 - 99 mg/dL Final   Glucose reference range applies only to samples taken after fasting for at least 8 hours.   BUN 02/09/2022 9  6 - 20 mg/dL Final   Creatinine, Ser 02/09/2022 0.44  0.44 - 1.00 mg/dL Final   Calcium 02/09/2022 8.3 (L)  8.9 - 10.3 mg/dL Final   GFR, Estimated 02/09/2022 >60  >60 mL/min Final   Comment: (NOTE) Calculated using the CKD-EPI Creatinine Equation (2021)    Anion gap 02/09/2022 11  5 - 15 Final   Performed at [facility], [place] [address], [place], [place] [address]   Iron 02/09/2022 20 (L)  28 - 170 ug/dL Final   TIBC 02/09/2022 455 (H)  250 - 450 ug/dL Final   Saturation Ratios 02/09/2022 4 (L)  10.4 - 31.8 % Final   UIBC 02/09/2022 435  ug/dL Final   Performed at [facility], [place] [address], [place], [place] [address]   Ferritin 02/09/2022 2 (L)  11 - 307 ng/mL Final   Performed at [facility] [address] , [place] [address]   Weight 02/09/2022 2,846.58  oz Final   Height  02/09/2022 62  in Final   BP 02/09/2022 143/80  mmHg Final   WBC 02/09/2022 8.1  4.0 - 10.5 K/uL Final   RBC 02/09/2022 4.38  3.87 - 5.11 MIL/uL Final   Hemoglobin 02/09/2022 10.9 (L)  12.0 - 15.0 g/dL Final   HCT 02/09/2022 33.3 (L)  36.0 - 46.0 % Final   MCV 02/09/2022 76.0 (L)  80.0 - 100.0 fL Final   MCH 02/09/2022 24.9 (L)  26.0 - 34.0 pg Final  MCHC 02/09/2022 32.7  30.0 - 36.0 g/dL Final   RDW 02/09/2022 16.5 (H)  11.5 - 15.5 % Final   Platelets 02/09/2022 397  150 - 400 K/uL Final   nRBC 02/09/2022 0.0  0.0 - 0.2 % Final   Neutrophils Relative % 02/09/2022 66  % Final   Neutro Abs 02/09/2022 5.4  1.7 - 7.7 K/uL Final   Lymphocytes Relative 02/09/2022 24  % Final   Lymphs Abs 02/09/2022 1.9  0.7 - 4.0 K/uL Final   Monocytes Relative 02/09/2022 8  % Final   Monocytes Absolute 02/09/2022 0.6  0.1 - 1.0 K/uL Final   Eosinophils Relative 02/09/2022 1  % Final   Eosinophils Absolute 02/09/2022 0.1  0.0 - 0.5 K/uL Final   Basophils Relative 02/09/2022 1  % Final   Basophils Absolute 02/09/2022 0.1  0.0 - 0.1 K/uL Final   Immature Granulocytes 02/09/2022 0  % Final   Abs Immature Granulocytes 02/09/2022 0.03  0.00 - 0.07 K/uL Final   Performed at [facility], [place] [address], [place], [place] [address]   Glucose-Capillary 02/09/2022 238 (H)  70 - 99 mg/dL Final   Glucose reference range applies only to samples taken after fasting for at least 8 hours.   Glucose-Capillary 02/09/2022 91  70 - 99 mg/dL Final   Glucose reference range applies only to samples taken after fasting for at least 8 hours.   CRP 02/09/2022 <0.5  <1.0 mg/dL Final   Performed at [facility] [address] [place], [place] [address]   Sed Rate 02/09/2022 15  0 - 22 mm/hr Final   Performed at [facility] [address], [place],  [address]   Glucose-Capillary 02/09/2022 137 (H)  70 - 99 mg/dL Final   Glucose reference range applies only to samples taken after fasting for at least 8  hours.  Admission on 01/24/2022, Discharged on 02/08/2022  Component Date Value Ref Range Status   Glucose-Capillary 01/24/2022 143 (H)  70 - 99 mg/dL Final   Glucose reference range applies only to samples taken after fasting for at least 8 hours.   Glucose-Capillary 01/24/2022 120 (H)  70 - 99 mg/dL Final   Glucose reference range applies only to samples taken after fasting for at least 8 hours.   Glucose-Capillary 01/25/2022 122 (H)  70 - 99 mg/dL Final   Glucose reference range applies only to samples taken after fasting for at least 8 hours.   Glucose-Capillary 01/25/2022 190 (H)  70 - 99 mg/dL Final   Glucose reference range applies only to samples taken after fasting for at least 8 hours.   Glucose-Capillary 01/25/2022 163 (H)  70 - 99 mg/dL Final   Glucose reference range applies only to samples taken after fasting for at least 8 hours.   WBC 01/26/2022 6.5  4.0 - 10.5 K/uL Final   RBC 01/26/2022 4.53  3.87 - 5.11 MIL/uL Final   Hemoglobin 01/26/2022 11.3 (L)  12.0 - 15.0 g/dL Final   HCT 01/26/2022 36.4  36.0 - 46.0 % Final   MCV 01/26/2022 80.4  80.0 - 100.0 fL Final   MCH 01/26/2022 24.9 (L)  26.0 - 34.0 pg Final   MCHC 01/26/2022 31.0  30.0 - 36.0 g/dL Final   RDW 01/26/2022 17.3 (H)  11.5 - 15.5 % Final   Platelets 01/26/2022 327  150 - 400 K/uL Final   nRBC 01/26/2022 0.0  0.0 - 0.2 % Final   Neutrophils Relative %  01/26/2022 60  % Final   Neutro Abs 01/26/2022 3.9  1.7 - 7.7 K/uL Final   Lymphocytes Relative 01/26/2022 31  % Final   Lymphs Abs 01/26/2022 2.0  0.7 - 4.0 K/uL Final   Monocytes Relative 01/26/2022 7  % Final   Monocytes Absolute 01/26/2022 0.5  0.1 - 1.0 K/uL Final   Eosinophils Relative 01/26/2022 1  % Final   Eosinophils Absolute 01/26/2022 0.1  0.0 - 0.5 K/uL Final   Basophils Relative 01/26/2022 1  % Final   Basophils Absolute 01/26/2022 0.1  0.0 - 0.1 K/uL Final   Immature Granulocytes 01/26/2022 0  % Final   Abs Immature Granulocytes 01/26/2022  0.02  0.00 - 0.07 K/uL Final   Performed at [facility] [address] [place], [place] [address]   Glucose-Capillary 01/26/2022 149 (H)  70 - 99 mg/dL Final   Glucose reference range applies only to samples taken after fasting for at least 8 hours.   Glucose-Capillary 01/26/2022 130 (H)  70 - 99 mg/dL Final   Glucose reference range applies only to samples taken after fasting for at least 8 hours.   Glucose-Capillary 01/26/2022 151 (H)  70 - 99 mg/dL Final   Glucose reference range applies only to samples taken after fasting for at least 8 hours.   Glucose-Capillary 01/26/2022 170 (H)  70 - 99 mg/dL Final   Glucose reference range applies only to samples taken after fasting for at least 8 hours.   Glucose-Capillary 01/27/2022 129 (H)  70 - 99 mg/dL Final   Glucose reference range applies only to samples taken after fasting for at least 8 hours.   Glucose-Capillary 01/27/2022 101 (H)  70 - 99 mg/dL Final   Glucose reference range applies only to samples taken after fasting for at least 8 hours.   Glucose-Capillary 01/27/2022 220 (H)  70 - 99 mg/dL Final   Glucose reference range applies only to samples taken after fasting for at least 8 hours.   Glucose-Capillary 01/27/2022 154 (H)  70 - 99 mg/dL Final   Glucose reference range applies only to samples taken after fasting for at least 8 hours.   Glucose-Capillary 01/27/2022 163 (H)  70 - 99 mg/dL Final   Glucose reference range applies only to samples taken after fasting for at least 8 hours.   Glucose-Capillary 01/28/2022 127 (H)  70 - 99 mg/dL Final   Glucose reference range applies only to samples taken after fasting for at least 8 hours.   Glucose-Capillary 01/28/2022 110 (H)  70 - 99 mg/dL Final   Glucose reference range applies only to samples taken after fasting for at least 8 hours.   Glucose-Capillary 01/28/2022 167 (H)  70 - 99 mg/dL Final   Glucose reference range applies only to samples taken after fasting for at least 8  hours.   Glucose-Capillary 01/29/2022 145 (H)  70 - 99 mg/dL Final   Glucose reference range applies only to samples taken after fasting for at least 8 hours.   Glucose-Capillary 01/29/2022 203 (H)  70 - 99 mg/dL Final   Glucose reference range applies only to samples taken after fasting for at least 8 hours.   Glucose-Capillary 01/29/2022 153 (H)  70 - 99 mg/dL Final   Glucose reference range applies only to samples taken after fasting for at least 8 hours.   Glucose-Capillary 01/29/2022 166 (H)  70 - 99 mg/dL Final   Glucose reference range applies only to samples taken after fasting for  at least 8 hours.   Glucose-Capillary 01/30/2022 142 (H)  70 - 99 mg/dL Final   Glucose reference range applies only to samples taken after fasting for at least 8 hours.   Glucose-Capillary 01/30/2022 138 (H)  70 - 99 mg/dL Final   Glucose reference range applies only to samples taken after fasting for at least 8 hours.   Glucose-Capillary 01/30/2022 185 (H)  70 - 99 mg/dL Final   Glucose reference range applies only to samples taken after fasting for at least 8 hours.   Glucose-Capillary 01/30/2022 220 (H)  70 - 99 mg/dL Final   Glucose reference range applies only to samples taken after fasting for at least 8 hours.   Glucose-Capillary 01/31/2022 147 (H)  70 - 99 mg/dL Final   Glucose reference range applies only to samples taken after fasting for at least 8 hours.   Glucose-Capillary 01/31/2022 131 (H)  70 - 99 mg/dL Final   Glucose reference range applies only to samples taken after fasting for at least 8 hours.   Glucose-Capillary 01/31/2022 169 (H)  70 - 99 mg/dL Final   Glucose reference range applies only to samples taken after fasting for at least 8 hours.   Glucose-Capillary 01/31/2022 144 (H)  70 - 99 mg/dL Final   Glucose reference range applies only to samples taken after fasting for at least 8 hours.   Comment 1 01/31/2022 RN AWARE   Final   Glucose-Capillary 02/01/2022 117 (H)  70 - 99  mg/dL Final   Glucose reference range applies only to samples taken after fasting for at least 8 hours.   Glucose-Capillary 02/01/2022 180 (H)  70 - 99 mg/dL Final   Glucose reference range applies only to samples taken after fasting for at least 8 hours.   Glucose-Capillary 02/01/2022 209 (H)  70 - 99 mg/dL Final   Glucose reference range applies only to samples taken after fasting for at least 8 hours.   Glucose-Capillary 02/02/2022 131 (H)  70 - 99 mg/dL Final   Glucose reference range applies only to samples taken after fasting for at least 8 hours.   WBC 02/02/2022 7.5  4.0 - 10.5 K/uL Final   RBC 02/02/2022 4.52  3.87 - 5.11 MIL/uL Final   Hemoglobin 02/02/2022 11.3 (L)  12.0 - 15.0 g/dL Final   HCT 92/44/6286 35.0 (L)  36.0 - 46.0 % Final   MCV 02/02/2022 77.4 (L)  80.0 - 100.0 fL Final   MCH 02/02/2022 25.0 (L)  26.0 - 34.0 pg Final   MCHC 02/02/2022 32.3  30.0 - 36.0 g/dL Final   RDW 38/17/7116 16.7 (H)  11.5 - 15.5 % Final   Platelets 02/02/2022 345  150 - 400 K/uL Final   nRBC 02/02/2022 0.0  0.0 - 0.2 % Final   Neutrophils Relative % 02/02/2022 63  % Final   Neutro Abs 02/02/2022 4.7  1.7 - 7.7 K/uL Final   Lymphocytes Relative 02/02/2022 28  % Final   Lymphs Abs 02/02/2022 2.1  0.7 - 4.0 K/uL Final   Monocytes Relative 02/02/2022 7  % Final   Monocytes Absolute 02/02/2022 0.5  0.1 - 1.0 K/uL Final   Eosinophils Relative 02/02/2022 1  % Final   Eosinophils Absolute 02/02/2022 0.1  0.0 - 0.5 K/uL Final   Basophils Relative 02/02/2022 1  % Final   Basophils Absolute 02/02/2022 0.1  0.0 - 0.1 K/uL Final   Immature Granulocytes 02/02/2022 0  % Final   Abs Immature Granulocytes 02/02/2022 0.03  0.00 - 0.07 K/uL Final   Performed at [facility], [place] [address], [place], [place] [address]   Glucose-Capillary 02/02/2022 95  70 - 99 mg/dL Final   Glucose reference range applies only to samples taken after fasting for at least 8 hours.   Glucose-Capillary 02/02/2022 120 (H)   70 - 99 mg/dL Final   Glucose reference range applies only to samples taken after fasting for at least 8 hours.   Glucose-Capillary 02/02/2022 191 (H)  70 - 99 mg/dL Final   Glucose reference range applies only to samples taken after fasting for at least 8 hours.   Glucose-Capillary 02/03/2022 148 (H)  70 - 99 mg/dL Final   Glucose reference range applies only to samples taken after fasting for at least 8 hours.   Glucose-Capillary 02/03/2022 122 (H)  70 - 99 mg/dL Final   Glucose reference range applies only to samples taken after fasting for at least 8 hours.   Glucose-Capillary 02/03/2022 233 (H)  70 - 99 mg/dL Final   Glucose reference range applies only to samples taken after fasting for at least 8 hours.   Glucose-Capillary 02/04/2022 126 (H)  70 - 99 mg/dL Final   Glucose reference range applies only to samples taken after fasting for at least 8 hours.   Glucose-Capillary 02/04/2022 117 (H)  70 - 99 mg/dL Final   Glucose reference range applies only to samples taken after fasting for at least 8 hours.   Glucose-Capillary 02/04/2022 135 (H)  70 - 99 mg/dL Final   Glucose reference range applies only to samples taken after fasting for at least 8 hours.   Glucose-Capillary 02/04/2022 197 (H)  70 - 99 mg/dL Final   Glucose reference range applies only to samples taken after fasting for at least 8 hours.   Glucose-Capillary 02/05/2022 170 (H)  70 - 99 mg/dL Final   Glucose reference range applies only to samples taken after fasting for at least 8 hours.   Glucose-Capillary 02/05/2022 107 (H)  70 - 99 mg/dL Final   Glucose reference range applies only to samples taken after fasting for at least 8 hours.   Glucose-Capillary 02/05/2022 184 (H)  70 - 99 mg/dL Final   Glucose reference range applies only to samples taken after fasting for at least 8 hours.   Glucose-Capillary 02/05/2022 256 (H)  70 - 99 mg/dL Final   Glucose reference range applies only to samples taken after fasting for at  least 8 hours.   Glucose-Capillary 02/06/2022 144 (H)  70 - 99 mg/dL Final   Glucose reference range applies only to samples taken after fasting for at least 8 hours.   Glucose-Capillary 02/06/2022 190 (H)  70 - 99 mg/dL Final   Glucose reference range applies only to samples taken after fasting for at least 8 hours.   Glucose-Capillary 02/06/2022 92  70 - 99 mg/dL Final   Glucose reference range applies only to samples taken after fasting for at least 8 hours.   Trichomonas 02/06/2022 Negative   Final   Bacterial Vaginitis-Urine 02/06/2022 Negative   Final   Molecular Comment 02/06/2022 For tests bacteria and/or candida, this specimen does not meet the   Final   Molecular Comment 02/06/2022 strict criteria set by the FDA. The result interpretation should be   Final   Molecular Comment 02/06/2022 considered in conjunction with the patient's clinical history.   Final   Comment 02/06/2022 Normal Reference Range Trichomonas - Negative   Final   Glucose-Capillary  02/06/2022 197 (H)  70 - 99 mg/dL Final   Glucose reference range applies only to samples taken after fasting for at least 8 hours.   Glucose-Capillary 02/07/2022 129 (H)  70 - 99 mg/dL Final   Glucose reference range applies only to samples taken after fasting for at least 8 hours.   Glucose-Capillary 02/07/2022 207 (H)  70 - 99 mg/dL Final   Glucose reference range applies only to samples taken after fasting for at least 8 hours.   Glucose-Capillary 02/07/2022 153 (H)  70 - 99 mg/dL Final   Glucose reference range applies only to samples taken after fasting for at least 8 hours.   Glucose-Capillary 02/08/2022 129 (H)  70 - 99 mg/dL Final   Glucose reference range applies only to samples taken after fasting for at least 8 hours.   Glucose-Capillary 02/08/2022 107 (H)  70 - 99 mg/dL Final   Glucose reference range applies only to samples taken after fasting for at least 8 hours.  Admission on 01/23/2022, Discharged on 01/24/2022   Component Date Value Ref Range Status   Sodium 01/23/2022 140  135 - 145 mmol/L Final   Potassium 01/23/2022 4.4  3.5 - 5.1 mmol/L Final   Chloride 01/23/2022 101  98 - 111 mmol/L Final   CO2 01/23/2022 25  22 - 32 mmol/L Final   Glucose, Bld 01/23/2022 198 (H)  70 - 99 mg/dL Final   Glucose reference range applies only to samples taken after fasting for at least 8 hours.   BUN 01/23/2022 13  6 - 20 mg/dL Final   Creatinine, Ser 01/23/2022 0.62  0.44 - 1.00 mg/dL Final   Calcium 01/23/2022 9.3  8.9 - 10.3 mg/dL Final   GFR, Estimated 01/23/2022 >60  >60 mL/min Final   Comment: (NOTE) Calculated using the CKD-EPI Creatinine Equation (2021)    Anion gap 01/23/2022 14  5 - 15 Final   Performed at [facility], [place] [address] [place], [place] [address]   WBC 01/23/2022 5.8  4.0 - 10.5 K/uL Final   RBC 01/23/2022 4.63  3.87 - 5.11 MIL/uL Final   Hemoglobin 01/23/2022 11.7 (L)  12.0 - 15.0 g/dL Final   HCT 01/23/2022 37.4  36.0 - 46.0 % Final   MCV 01/23/2022 80.8  80.0 - 100.0 fL Final   MCH 01/23/2022 25.3 (L)  26.0 - 34.0 pg Final   MCHC 01/23/2022 31.3  30.0 - 36.0 g/dL Final   RDW 01/23/2022 17.9 (H)  11.5 - 15.5 % Final   Platelets 01/23/2022 333  150 - 400 K/uL Final   nRBC 01/23/2022 0.0  0.0 - 0.2 % Final   Performed at [facility] [address] [place], [place] [address]   Troponin I (High Sensitivity) 01/23/2022 6  <18 ng/L Final   Comment: (NOTE) Elevated high sensitivity troponin I (hsTnI) values and significant  changes across serial measurements may suggest ACS but many other  chronic and acute conditions are known to elevate hsTnI results.  Refer to the "Links" section for chest pain algorithms and additional  guidance. Performed at [facility], [place] [address] [place], [place] [address]    Troponin I (High Sensitivity) 01/23/2022 5  <18 ng/L Final   Comment: (NOTE) Elevated high sensitivity troponin I (hsTnI) values and significant  changes  across serial measurements may suggest ACS but many other  chronic and acute conditions are known to elevate hsTnI results.  Refer to the "Links" section for  chest pain algorithms and additional  guidance. Performed at [facility], [address], [place], [place] [address]    SARS Coronavirus 2 by RT PCR 01/23/2022 NEGATIVE  NEGATIVE Final   Comment: (NOTE) SARS-CoV-2 target nucleic acids are NOT DETECTED.  The SARS-CoV-2 RNA is generally detectable in upper and lower respiratory specimens during the acute phase of infection. The lowest concentration of SARS-CoV-2 viral copies this assay can detect is 250 copies / mL. A negative result does not preclude SARS-CoV-2 infection and should not be used as the sole basis for treatment or other patient management decisions.  A negative result may occur with improper specimen collection / handling, submission of specimen other than nasopharyngeal swab, presence of viral mutation(s) within the areas targeted by this assay, and inadequate number of viral copies (<250 copies / mL). A negative result must be combined with clinical observations, patient history, and epidemiological information.  Fact Sheet for Patients:   RoadLapTop.co.za  Fact Sheet for Healthcare Providers: http://kim-miller.com/  This test is not yet approved or                           cleared by the [place] FDA and has been authorized for detection and/or diagnosis of SARS-CoV-2 by FDA under an Emergency Use Authorization (EUA).  This EUA will remain in effect (meaning this test can be used) for the duration of the COVID-19 declaration under Section 564(b)(1) of the Act, 21 U.S.C. section 360bbb-3(b)(1), unless the authorization is terminated or revoked sooner.  Performed at [facility], [address] [place], [place] [address]    B Natriuretic Peptide 01/23/2022 41.3  0.0 - 100.0 pg/mL Final    Performed at [facility], [address] [place], [place] [address]   Group A Strep by PCR 01/23/2022 NOT DETECTED  NOT DETECTED Final   Performed at [facility], [address] [place], [place] [address]   Color, Urine 01/23/2022 YELLOW  YELLOW Final   APPearance 01/23/2022 CLEAR  CLEAR Final   Specific Gravity, Urine 01/23/2022 1.018  1.005 - 1.030 Final   pH 01/23/2022 7.0  5.0 - 8.0 Final   Glucose, UA 01/23/2022 NEGATIVE  NEGATIVE mg/dL Final   Hgb urine dipstick 01/23/2022 NEGATIVE  NEGATIVE Final   Bilirubin Urine 01/23/2022 NEGATIVE  NEGATIVE Final   Ketones, ur 01/23/2022 NEGATIVE  NEGATIVE mg/dL Final   Protein, ur 63/03/6008 NEGATIVE  NEGATIVE mg/dL Final   Nitrite 93/23/5573 NEGATIVE  NEGATIVE Final   Leukocytes,Ua 01/23/2022 TRACE (A)  NEGATIVE Final   RBC / HPF 01/23/2022 0-5  0 - 5 RBC/hpf Final   WBC, UA 01/23/2022 0-5  0 - 5 WBC/hpf Final   Bacteria, UA 01/23/2022 NONE SEEN  NONE SEEN Final   Squamous Epithelial / LPF 01/23/2022 0-5  0 - 5 Final   Mucus 01/23/2022 PRESENT   Final   Performed at [facility], [address], [place], [place] [address]   SARS Coronavirus 2 by RT PCR 01/23/2022 NEGATIVE  NEGATIVE Final   Comment: (NOTE) SARS-CoV-2 target nucleic acids are NOT DETECTED.  The SARS-CoV-2 RNA is generally detectable in upper respiratory specimens during the acute phase of infection. The lowest concentration of SARS-CoV-2 viral copies this assay can detect is 138 copies/mL. A negative result does not preclude SARS-Cov-2 infection and should not be used as the sole basis for treatment or other patient management decisions. A negative  result may occur with  improper specimen collection/handling, submission of specimen other than nasopharyngeal swab, presence of viral mutation(s) within the areas targeted by this assay, and inadequate number of viral copies(<138 copies/mL). A negative result must be combined with clinical observations,  patient history, and epidemiological information. The expected result is Negative.  Fact Sheet for Patients:  EntrepreneurPulse.com.au  Fact Sheet for Healthcare Providers:  IncredibleEmployment.be  This test is no                          t yet approved or cleared by the [place] FDA and  has been authorized for detection and/or diagnosis of SARS-CoV-2 by FDA under an Emergency Use Authorization (EUA). This EUA will remain  in effect (meaning this test can be used) for the duration of the COVID-19 declaration under Section 564(b)(1) of the Act, 21 U.S.C.section 360bbb-3(b)(1), unless the authorization is terminated  or revoked sooner.       Influenza A by PCR 01/23/2022 NEGATIVE  NEGATIVE Final   Influenza B by PCR 01/23/2022 NEGATIVE  NEGATIVE Final   Comment: (NOTE) The Xpert Xpress SARS-CoV-2/FLU/RSV plus assay is intended as an aid in the diagnosis of influenza from Nasopharyngeal swab specimens and should not be used as a sole basis for treatment. Nasal washings and aspirates are unacceptable for Xpert Xpress SARS-CoV-2/FLU/RSV testing.  Fact Sheet for Patients: EntrepreneurPulse.com.au  Fact Sheet for Healthcare Providers: IncredibleEmployment.be  This test is not yet approved or cleared by the [place] FDA and has been authorized for detection and/or diagnosis of SARS-CoV-2 by FDA under an Emergency Use Authorization (EUA). This EUA will remain in effect (meaning this test can be used) for the duration of the COVID-19 declaration under Section 564(b)(1) of the Act, 21 U.S.C. section 360bbb-3(b)(1), unless the authorization is terminated or revoked.  Performed at [facility], [place] [address], [place], [place] [address]    WBC 01/24/2022 6.1  4.0 - 10.5 K/uL Final   RBC 01/24/2022 4.60  3.87 - 5.11 MIL/uL Final   Hemoglobin 01/24/2022 11.7 (L)  12.0 - 15.0 g/dL Final   HCT  01/24/2022 37.0  36.0 - 46.0 % Final   MCV 01/24/2022 80.4  80.0 - 100.0 fL Final   MCH 01/24/2022 25.4 (L)  26.0 - 34.0 pg Final   MCHC 01/24/2022 31.6  30.0 - 36.0 g/dL Final   RDW 01/24/2022 17.6 (H)  11.5 - 15.5 % Final   Platelets 01/24/2022 334  150 - 400 K/uL Final   nRBC 01/24/2022 0.0  0.0 - 0.2 % Final   Neutrophils Relative % 01/24/2022 53  % Final   Neutro Abs 01/24/2022 3.3  1.7 - 7.7 K/uL Final   Lymphocytes Relative 01/24/2022 33  % Final   Lymphs Abs 01/24/2022 2.0  0.7 - 4.0 K/uL Final   Monocytes Relative 01/24/2022 11  % Final   Monocytes Absolute 01/24/2022 0.7  0.1 - 1.0 K/uL Final   Eosinophils Relative 01/24/2022 2  % Final   Eosinophils Absolute 01/24/2022 0.1  0.0 - 0.5 K/uL Final   Basophils Relative 01/24/2022 1  % Final   Basophils Absolute 01/24/2022 0.1  0.0 - 0.1 K/uL Final   Immature Granulocytes 01/24/2022 0  % Final   Abs Immature Granulocytes 01/24/2022 0.02  0.00 - 0.07 K/uL Final   Performed at [facility], [place] [address], [place], [place] [address]   Glucose-Capillary 01/24/2022 110 (H)  70 - 99  mg/dL Final   Glucose reference range applies only to samples taken after fasting for at least 8 hours.  No results displayed because visit has over 200 results.    Admission on 11/22/2021, Discharged on 11/22/2021  Component Date Value Ref Range Status   Glucose-Capillary 11/22/2021 169 (H)  70 - 99 mg/dL Final   Glucose reference range applies only to samples taken after fasting for at least 8 hours.  No results displayed because visit has over 200 results.    Admission on 10/04/2021, Discharged on 10/22/2021  Component Date Value Ref Range Status   SARS Coronavirus 2 by RT PCR 10/04/2021 NEGATIVE  NEGATIVE Final   Comment: (NOTE) SARS-CoV-2 target nucleic acids are NOT DETECTED.  The SARS-CoV-2 RNA is generally detectable in upper respiratory specimens during the acute phase of infection. The lowest concentration of SARS-CoV-2 viral copies  this assay can detect is 138 copies/mL. A negative result does not preclude SARS-Cov-2 infection and should not be used as the sole basis for treatment or other patient management decisions. A negative result may occur with  improper specimen collection/handling, submission of specimen other than nasopharyngeal swab, presence of viral mutation(s) within the areas targeted by this assay, and inadequate number of viral copies(<138 copies/mL). A negative result must be combined with clinical observations, patient history, and epidemiological information. The expected result is Negative.  Fact Sheet for Patients:  EntrepreneurPulse.com.au  Fact Sheet for Healthcare Providers:  IncredibleEmployment.be  This test is no                          t yet approved or cleared by the [place] FDA and  has been authorized for detection and/or diagnosis of SARS-CoV-2 by FDA under an Emergency Use Authorization (EUA). This EUA will remain  in effect (meaning this test can be used) for the duration of the COVID-19 declaration under Section 564(b)(1) of the Act, 21 U.S.C.section 360bbb-3(b)(1), unless the authorization is terminated  or revoked sooner.       Influenza A by PCR 10/04/2021 NEGATIVE  NEGATIVE Final   Influenza B by PCR 10/04/2021 NEGATIVE  NEGATIVE Final   Comment: (NOTE) The Xpert Xpress SARS-CoV-2/FLU/RSV plus assay is intended as an aid in the diagnosis of influenza from Nasopharyngeal swab specimens and should not be used as a sole basis for treatment. Nasal washings and aspirates are unacceptable for Xpert Xpress SARS-CoV-2/FLU/RSV testing.  Fact Sheet for Patients: EntrepreneurPulse.com.au  Fact Sheet for Healthcare Providers: IncredibleEmployment.be  This test is not yet approved or cleared by the [place] FDA and has been authorized for detection and/or diagnosis of SARS-CoV-2 by FDA under  an Emergency Use Authorization (EUA). This EUA will remain in effect (meaning this test can be used) for the duration of the COVID-19 declaration under Section 564(b)(1) of the Act, 21 U.S.C. section 360bbb-3(b)(1), unless the authorization is terminated or revoked.  Performed at [facility], [place] [address] [place], [place] [address]    WBC 10/04/2021 8.4  4.0 - 10.5 K/uL Final   RBC 10/04/2021 4.42  3.87 - 5.11 MIL/uL Final   Hemoglobin 10/04/2021 11.6 (L)  12.0 - 15.0 g/dL Final   HCT 10/04/2021 35.7 (L)  36.0 - 46.0 % Final   MCV 10/04/2021 80.8  80.0 - 100.0 fL Final   MCH 10/04/2021 26.2  26.0 - 34.0 pg Final   MCHC 10/04/2021 32.5  30.0 - 36.0 g/dL Final   RDW 10/04/2021 16.0 (H)  11.5 - 15.5 % Final   Platelets 10/04/2021 372  150 - 400 K/uL Final   nRBC 10/04/2021 0.0  0.0 - 0.2 % Final   Neutrophils Relative % 10/04/2021 68  % Final   Neutro Abs 10/04/2021 5.7  1.7 - 7.7 K/uL Final   Lymphocytes Relative 10/04/2021 22  % Final   Lymphs Abs 10/04/2021 1.8  0.7 - 4.0 K/uL Final   Monocytes Relative 10/04/2021 8  % Final   Monocytes Absolute 10/04/2021 0.7  0.1 - 1.0 K/uL Final   Eosinophils Relative 10/04/2021 1  % Final   Eosinophils Absolute 10/04/2021 0.1  0.0 - 0.5 K/uL Final   Basophils Relative 10/04/2021 1  % Final   Basophils Absolute 10/04/2021 0.1  0.0 - 0.1 K/uL Final   Immature Granulocytes 10/04/2021 0  % Final   Abs Immature Granulocytes 10/04/2021 0.03  0.00 - 0.07 K/uL Final   Performed at [facility], [place] [address] [place], [place] [address]   Sodium 10/04/2021 136  135 - 145 mmol/L Final   Potassium 10/04/2021 4.2  3.5 - 5.1 mmol/L Final   Chloride 10/04/2021 104  98 - 111 mmol/L Final   CO2 10/04/2021 25  22 - 32 mmol/L Final   Glucose, Bld 10/04/2021 100 (H)  70 - 99 mg/dL Final   Glucose reference range applies only to samples taken after fasting for at least 8 hours.   BUN 10/04/2021 9  6 - 20 mg/dL Final   Creatinine, Ser  10/04/2021 0.52  0.44 - 1.00 mg/dL Final   Calcium 10/04/2021 9.0  8.9 - 10.3 mg/dL Final   Total Protein 10/04/2021 7.0  6.5 - 8.1 g/dL Final   Albumin 10/04/2021 3.2 (L)  3.5 - 5.0 g/dL Final   AST 10/04/2021 13 (L)  15 - 41 U/L Final   ALT 10/04/2021 10  0 - 44 U/L Final   Alkaline Phosphatase 10/04/2021 61  38 - 126 U/L Final   Total Bilirubin 10/04/2021 0.3  0.3 - 1.2 mg/dL Final   GFR, Estimated 10/04/2021 >60  >60 mL/min Final   Comment: (NOTE) Calculated using the CKD-EPI Creatinine Equation (2021)    Anion gap 10/04/2021 7  5 - 15 Final   Performed at [facility] [address], [place], [place] [address]   Hgb A1c MFr Bld 10/04/2021 7.0 (H)  4.8 - 5.6 % Final   Comment: (NOTE) Pre diabetes:          5.7%-6.4%  Diabetes:              >6.4%  Glycemic control for   <7.0% adults with diabetes    Mean Plasma Glucose 10/04/2021 154.2  mg/dL Final   Performed at [facility], [place] [address], [place], [place] [address]   Cholesterol 10/04/2021 178  0 - 200 mg/dL Final   Triglycerides 10/04/2021 155 (H)  <150 mg/dL Final   HDL 10/04/2021 52  >40 mg/dL Final   Total CHOL/HDL Ratio 10/04/2021 3.4  RATIO Final   VLDL 10/04/2021 31  0 - 40 mg/dL Final   LDL Cholesterol 10/04/2021 95  0 - 99 mg/dL Final   Comment:        Total Cholesterol/HDL:CHD Risk Coronary Heart Disease Risk Table                     Men   Women  1/2 Average Risk   3.4   3.3  Average Risk  5.0   4.4  2 X Average Risk   9.6   7.1  3 X Average Risk  23.4   11.0        Use the calculated Patient Ratio above and the CHD Risk Table to determine the patient's CHD Risk.        ATP III CLASSIFICATION (LDL):  <100     mg/dL   Optimal  100-129  mg/dL   Near or Above                    Optimal  130-159  mg/dL   Borderline  160-189  mg/dL   High  >190     mg/dL   Very High Performed at [facility] [address], [place], [place] [address]    POC Amphetamine UR 10/04/2021 None  Detected  NONE DETECTED (Cut Off Level 1000 ng/mL) Final   POC Secobarbital (BAR) 10/04/2021 None Detected  NONE DETECTED (Cut Off Level 300 ng/mL) Final   POC Buprenorphine (BUP) 10/04/2021 None Detected  NONE DETECTED (Cut Off Level 10 ng/mL) Final   POC Oxazepam (BZO) 10/04/2021 None Detected  NONE DETECTED (Cut Off Level 300 ng/mL) Final   POC Cocaine UR 10/04/2021 None Detected  NONE DETECTED (Cut Off Level 300 ng/mL) Final   POC Methamphetamine UR 10/04/2021 None Detected  NONE DETECTED (Cut Off Level 1000 ng/mL) Final   POC Morphine 10/04/2021 None Detected  NONE DETECTED (Cut Off Level 300 ng/mL) Final   POC Methadone UR 10/04/2021 None Detected  NONE DETECTED (Cut Off Level 300 ng/mL) Final   POC Oxycodone UR 10/04/2021 Positive (A)  NONE DETECTED (Cut Off Level 100 ng/mL) Final   POC Marijuana UR 10/04/2021 None Detected  NONE DETECTED (Cut Off Level 50 ng/mL) Final   SARSCOV2ONAVIRUS 2 AG 10/04/2021 NEGATIVE  NEGATIVE Final   Comment: (NOTE) SARS-CoV-2 antigen NOT DETECTED.   Negative results are presumptive.  Negative results do not preclude SARS-CoV-2 infection and should not be used as the sole basis for treatment or other patient management decisions, including infection  control decisions, particularly in the presence of clinical signs and  symptoms consistent with COVID-19, or in those who have been in contact with the virus.  Negative results must be combined with clinical observations, patient history, and epidemiological information. The expected result is Negative.  Fact Sheet for Patients: HandmadeRecipes.com.cy  Fact Sheet for Healthcare Providers: FuneralLife.at  This test is not yet approved or cleared by the [place] FDA and  has been authorized for detection and/or diagnosis of SARS-CoV-2 by FDA under an Emergency Use Authorization (EUA).  This EUA will remain in effect (meaning this test can be used) for the  duration of  the COV                          ID-19 declaration under Section 564(b)(1) of the Act, 21 U.S.C. section 360bbb-3(b)(1), unless the authorization is terminated or revoked sooner.     Valproic Acid Lvl 10/04/2021 51  50.0 - 100.0 ug/mL Final   Performed at [facility] [address] [place], [place] [address]   Valproic Acid Lvl 10/08/2021 57  50.0 - 100.0 ug/mL Final   Performed at [facility] [address] [place], [place] [address]   Glucose-Capillary 10/09/2021 123 (H)  70 - 99 mg/dL Final   Glucose reference range applies only to samples taken after fasting for at  least 8 hours.  Admission on 09/09/2021, Discharged on 09/10/2021  Component Date Value Ref Range Status   Sodium 09/09/2021 134 (L)  135 - 145 mmol/L Final   Potassium 09/09/2021 4.3  3.5 - 5.1 mmol/L Final   Chloride 09/09/2021 99  98 - 111 mmol/L Final   CO2 09/09/2021 25  22 - 32 mmol/L Final   Glucose, Bld 09/09/2021 107 (H)  70 - 99 mg/dL Final   Glucose reference range applies only to samples taken after fasting for at least 8 hours.   BUN 09/09/2021 12  6 - 20 mg/dL Final   Creatinine, Ser 09/09/2021 0.74  0.44 - 1.00 mg/dL Final   Calcium 09/09/2021 9.0  8.9 - 10.3 mg/dL Final   Total Protein 09/09/2021 7.4  6.5 - 8.1 g/dL Final   Albumin 09/09/2021 3.1 (L)  3.5 - 5.0 g/dL Final   AST 09/09/2021 13 (L)  15 - 41 U/L Final   ALT 09/09/2021 13  0 - 44 U/L Final   Alkaline Phosphatase 09/09/2021 66  38 - 126 U/L Final   Total Bilirubin 09/09/2021 0.2 (L)  0.3 - 1.2 mg/dL Final   GFR, Estimated 09/09/2021 >60  >60 mL/min Final   Comment: (NOTE) Calculated using the CKD-EPI Creatinine Equation (2021)    Anion gap 09/09/2021 10  5 - 15 Final   Performed at [facility], [place] [address], [place], [place] [address]   WBC 09/09/2021 8.5  4.0 - 10.5 K/uL Final   RBC 09/09/2021 4.53  3.87 - 5.11 MIL/uL Final   Hemoglobin 09/09/2021 11.8 (L)  12.0 - 15.0 g/dL Final   HCT 09/09/2021  38.0  36.0 - 46.0 % Final   MCV 09/09/2021 83.9  80.0 - 100.0 fL Final   MCH 09/09/2021 26.0  26.0 - 34.0 pg Final   MCHC 09/09/2021 31.1  30.0 - 36.0 g/dL Final   RDW 09/09/2021 17.8 (H)  11.5 - 15.5 % Final   Platelets 09/09/2021 442 (H)  150 - 400 K/uL Final   nRBC 09/09/2021 0.0  0.0 - 0.2 % Final   Neutrophils Relative % 09/09/2021 70  % Final   Neutro Abs 09/09/2021 5.9  1.7 - 7.7 K/uL Final   Lymphocytes Relative 09/09/2021 20  % Final   Lymphs Abs 09/09/2021 1.7  0.7 - 4.0 K/uL Final   Monocytes Relative 09/09/2021 8  % Final   Monocytes Absolute 09/09/2021 0.7  0.1 - 1.0 K/uL Final   Eosinophils Relative 09/09/2021 1  % Final   Eosinophils Absolute 09/09/2021 0.1  0.0 - 0.5 K/uL Final   Basophils Relative 09/09/2021 1  % Final   Basophils Absolute 09/09/2021 0.1  0.0 - 0.1 K/uL Final   Immature Granulocytes 09/09/2021 0  % Final   Abs Immature Granulocytes 09/09/2021 0.03  0.00 - 0.07 K/uL Final   Performed at [facility], [place] [address] [place], [place] [address]   Ammonia 09/09/2021 37 (H)  9 - 35 umol/L Final   Comment: HEMOLYSIS AT THIS LEVEL MAY AFFECT RESULT Performed at [facility], [place] [address], [place], [place] [address]    Opiates 09/09/2021 NONE DETECTED  NONE DETECTED Final   Cocaine 09/09/2021 NONE DETECTED  NONE DETECTED Final   Benzodiazepines 09/09/2021 NONE DETECTED  NONE DETECTED Final   Amphetamines 09/09/2021 NONE DETECTED  NONE DETECTED Final   Tetrahydrocannabinol 09/09/2021 NONE DETECTED  NONE DETECTED Final   Barbiturates 09/09/2021 NONE DETECTED  NONE DETECTED Final  Comment: (NOTE) DRUG SCREEN FOR MEDICAL PURPOSES ONLY.  IF CONFIRMATION IS NEEDED FOR ANY PURPOSE, NOTIFY LAB WITHIN 5 DAYS.  LOWEST DETECTABLE LIMITS FOR URINE DRUG SCREEN Drug Class                     Cutoff (ng/mL) Amphetamine and metabolites    1000 Barbiturate and metabolites    200 Benzodiazepine                 [id number] Tricyclics and metabolites      300 Opiates and metabolites        300 Cocaine and metabolites        300 THC                            50 Performed at [facility], [place] [address], [place], [place] [address]    Alcohol, Ethyl (B) 09/09/2021 <10  <10 mg/dL Final   Comment: (NOTE) Lowest detectable limit for serum alcohol is 10 mg/dL.  For medical purposes only. Performed at [facility], [place] [address] [place], [place] [address]    Lipase 09/09/2021 33  11 - 51 U/L Final   Performed at [facility] [address] [place], [place] [address]   Color, Urine 09/09/2021 COLORLESS (A)  YELLOW Final   APPearance 09/09/2021 CLEAR  CLEAR Final   Specific Gravity, Urine 09/09/2021 1.002 (L)  1.005 - 1.030 Final   pH 09/09/2021 6.0  5.0 - 8.0 Final   Glucose, UA 09/09/2021 NEGATIVE  NEGATIVE mg/dL Final   Hgb urine dipstick 09/09/2021 NEGATIVE  NEGATIVE Final   Bilirubin Urine 09/09/2021 NEGATIVE  NEGATIVE Final   Ketones, ur 09/09/2021 NEGATIVE  NEGATIVE mg/dL Final   Protein, ur 09/09/2021 NEGATIVE  NEGATIVE mg/dL Final   Nitrite 09/09/2021 NEGATIVE  NEGATIVE Final   Leukocytes,Ua 09/09/2021 NEGATIVE  NEGATIVE Final   Performed at [facility] [address] [place], [place] [address]   Specimen Description 09/09/2021 URINE, CLEAN CATCH   Final   Special Requests 09/09/2021 NONE   Final   Culture 09/09/2021  (A)   Final                   Value:<10,000 COLONIES/mL INSIGNIFICANT GROWTH Performed at [facility], [place] [address] [place], [place] [address]    Report Status 09/09/2021 09/10/2021 FINAL   Final   D-Dimer, Quant 09/09/2021 0.92 (H)  0.00 - 0.50 ug/mL-FEU Final   Comment: (NOTE) At the manufacturer cut-off value of 0.5 g/mL FEU, this assay has a negative predictive value of 95-100%.This assay is intended for use in conjunction with a clinical pretest probability (PTP) assessment model to exclude pulmonary embolism (PE) and deep venous thrombosis (DVT) in outpatients  suspected of PE or DVT. Results should be correlated with clinical presentation. Performed at [facility], [place] [address], [place], [place] [address]    Troponin I (High Sensitivity) 09/09/2021 5  <18 ng/L Final   Comment: (NOTE) Elevated high sensitivity troponin I (hsTnI) values and significant  changes across serial measurements may suggest ACS but many other  chronic and acute conditions are known to elevate hsTnI results.  Refer to the "Links" section for chest pain algorithms and additional  guidance. Performed at [facility], [place] [address], [place], [place] [address]    BP 09/10/2021 135/83  mmHg Final   S' Lateral  09/10/2021 2.30  cm Final   Area-P 1/2 09/10/2021 5.58  cm2 Final  There may be more visits with results that are not included.    Allergies: Penicillins and Penicillin g  PTA Medications: (Not in a hospital admission)   Medical Decision Making  Inpatient observation     Recommendations  Based on my evaluation the patient appears to have an emergency medical condition for which I recommend the patient be transferred to the emergency department for further evaluation.  [name], NP 02/18/22  6:26 AM

## 2022-02-18 NOTE — ED Provider Notes (Addendum)
Behavioral Health Progress Note  Date and Time: 02/18/2022 8:16 AM Name: Teresa Murillo MRN:  [id number]  Subjective:    Teresa Murillo is a 61 y.o. female with a past psychiatric history of schizophrenia, aggressive behavior, and possible intellectual disability presenting to [facility] on 11/23/21 voluntarily as a walk in via [facility] with complaints that she was locked out of the group home. Patient has been dismissed from group home due to multiple elopements. Pt had already been discharged from this facility but had been living there temporarily while DSS was looking for new placement. Pt is boarding at [facility].   AID to Capacity Evaluation (ACE) completed by Dr. Louis Meckel, Dr. Dwyane Dee on 12/11/2021.  Please see media tab for full details.    [name], PhD completed: Wechsler Adult Intelligence Scale-4, Teresa Murillo achieved a full-scale IQ score of 73 and a percentile rank of 4 placing her in the borderline range of intellectual functioning (12/14/2021) Please see consult note from [name], PhD on 12/14/2021.  On reassessment, pt is a&ox3, in no acute distress. She reports euthymic mood. She verbalizes understanding that social work is continuing to seek placement for her. She denies suicidal, homicidal or violent ideations, plan or intent. She denies auditory visual hallucinations or paranoia. There is no evidence of agitation, aggression, distractibility or internal preoccupation. No delusions or paranoia elicited. Pt is calm, cooperative, pleasant. Pt remains psychiatrically cleared. LCSW is working on placement.  Diagnosis:  Final diagnoses:  Schizophrenia, unspecified type ([place])    Total Time spent with patient: 15 minutes  Past Psychiatric History: see HPI Past Medical History:  Past Medical History:  Diagnosis Date  . 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 On the Wechsler Adult Intelligence  Scale-4, Teresa Murillo achieved a full-scale IQ score of 73 and a percentile rank of 4 placing her in the borderline range of intellectual functioning.   . Chronic obstructive pulmonary disease (COPD) ([place])   . Glaucoma   . Hyperlipidemia   . Hypertension   . Iron deficiency   . Schizoaffective disorder ([place])   . Type 2 diabetes mellitus ([place])     Past Surgical History:  Procedure Laterality Date  . TUBAL LIGATION     Family History:  Family History  Problem Relation Age of Onset  . Breast cancer Maternal Grandmother    Family Psychiatric  History: None reported Social History:  Social History   Substance and Sexual Activity  Alcohol Use Not Currently     Social History   Substance and Sexual Activity  Drug Use Not Currently    Social History   Socioeconomic History  . Marital status: Divorced    Spouse name: Not on file  . Number of children: Not on file  . Years of education: Not on file  . Highest education level: Not on file  Occupational History  . Not on file  Tobacco Use  . Smoking status: Former    Packs/day: 1.00    Types: Cigars, Cigarettes    Quit date: 11/09/2021    Years since quitting: 0.2  . Smokeless tobacco: Current  Vaping Use  . Vaping Use: Never used  Substance and Sexual Activity  . Alcohol use: Not Currently  . Drug use: Not Currently  . Sexual activity: Not Currently    Birth control/protection: Surgical  Other Topics Concern  . Not on file  Social History Narrative  . Not on file   Social Determinants of Health  Financial Resource Strain: Not on file  Food Insecurity: Not on file  Transportation Needs: Not on file  Physical Activity: Not on file  Stress: Not on file  Social Connections: Not on file   SDOH:  SDOH Screenings   Depression (PHQ2-9): Low Risk  (11/23/2021)  Tobacco Use: High Risk (02/17/2022)   Additional Social History:                         Sleep: Good  Appetite:  Good  Current  Medications:  Current Facility-Administered Medications  Medication Dose Route Frequency Provider Last Rate Last Admin  . acetaminophen (TYLENOL) tablet 650 mg  650 mg Oral Q6H PRN [name], NP      . albuterol (VENTOLIN HFA) 108 (90 Base) MCG/ACT inhaler 2 puff  2 puff Inhalation Q6H PRN [name], NP      . alum & mag hydroxide-simeth (MAALOX/MYLANTA) 200-200-20 MG/5ML suspension 30 mL  30 mL Oral Q4H PRN [name], NP      . apixaban Arne Cleveland) tablet 5 mg  5 mg Oral BID [name], NP      . ARIPiprazole (ABILIFY) tablet 10 mg  10 mg Oral Daily [name], NP      . cloZAPine (CLOZARIL) tablet 75 mg  75 mg Oral BID France Ravens, MD      . colchicine tablet 0.6 mg  0.6 mg Oral QHS [name], NP      . diltiazem (CARDIZEM CD) 24 hr capsule 240 mg  240 mg Oral Daily [name], NP      . divalproex (DEPAKOTE ER) 24 hr tablet 500 mg  500 mg Oral BID [name], NP      . haloperidol (HALDOL) tablet 10 mg  10 mg Oral TID PRN [name], NP      . insulin glargine-yfgn (SEMGLEE) injection 12 Units  12 Units Subcutaneous Daily [name], NP      . latanoprost (XALATAN) 0.005 % ophthalmic solution 1 drop  1 drop Both Eyes QHS [name], NP      . magnesium hydroxide (MILK OF MAGNESIA) suspension 30 mL  30 mL Oral Daily PRN [name], NP      . metFORMIN (GLUCOPHAGE) tablet 500 mg  500 mg Oral BID WC [name], NP   500 mg at 02/18/22 0804  . mometasone-formoterol (DULERA) 200-5 MCG/ACT inhaler 2 puff  2 puff Inhalation BID [name], NP   2 puff at 02/18/22 0807  . nicotine (NICODERM CQ - dosed in mg/24 hr) patch 7 mg  7 mg Transdermal Daily [name], NP      . valbenazine [facility]) capsule 40 mg  40 mg Oral q AM [name], NP   40 mg at 02/18/22 0804  . Vitamin D (Ergocalciferol) (DRISDOL) 1.25 MG (50000 UNIT) capsule 50,000 Units  50,000 Units Oral Q Leonie Green, NP       Current Outpatient Medications  Medication Sig Dispense  Refill  . Accu-Chek Softclix Lancets lancets Use as directed up to four times daily 100 each 0  . apixaban (ELIQUIS) 5 MG TABS tablet Take 1 tablet (5 mg total) by mouth 2 (two) times daily. 60 tablet 0  . ARIPiprazole (ABILIFY) 10 MG tablet Take 1 tablet (10 mg total) by mouth daily. 30 tablet 0  . Blood Glucose Monitoring Suppl (ACCU-CHEK GUIDE) w/Device KIT Use as directed up to four times daily 1 kit 0  . budesonide-formoterol (SYMBICORT) 160-4.5  MCG/ACT inhaler Inhale 2 puffs into the lungs in the morning and at bedtime.    . Cholecalciferol (VITAMIN D3) 1.25 MG (50000 UT) CAPS Take 50,000 Units by mouth every Thursday.    . clozapine (CLOZARIL) 50 MG tablet Take 1 tablet (50 mg total) by mouth daily. 30 tablet 0  . colchicine 0.6 MG tablet Take 1 tablet (0.6 mg total) by mouth at bedtime. 30 tablet 0  . diltiazem (CARDIZEM CD) 240 MG 24 hr capsule Take 1 capsule (240 mg total) by mouth daily. (Patient not taking: Reported on 11/24/2021) 30 capsule 0  . divalproex (DEPAKOTE ER) 500 MG 24 hr tablet Take 1 tablet (500 mg total) by mouth 2 (two) times daily. 60 tablet 0  . glucose blood test strip Use as directed up to four times daily 50 each 0  . haloperidol (HALDOL) 10 MG tablet Take 1 tablet (10 mg total) by mouth 3 (three) times daily as needed for agitation (and psychotic symptoms).    . INGREZZA 40 MG capsule Take 1 capsule (40 mg total) by mouth in the morning. 30 capsule 0  . insulin aspart (NOVOLOG) 100 UNIT/ML FlexPen Before each meal 3 times a day, 140-199 - 2 units, 200-250 - 4 units, 251-299 - 6 units,  300-349 - 8 units,  350 or above 10 units. Insulin PEN if approved, provide syringes and needles if needed.Please switch to any approved short acting Insulin if needed. 15 mL 0  . insulin glargine (LANTUS) 100 UNIT/ML Solostar Pen Inject 12 Units into the skin daily. 15 mL 0  . Insulin Pen Needle 32G X 4 MM MISC Use 4 times a day with insulin, 1 month supply. 100 each 0  . latanoprost  (XALATAN) 0.005 % ophthalmic solution Place 1 drop into both eyes at bedtime.    . metFORMIN (GLUCOPHAGE) 500 MG tablet Take 500 mg by mouth 2 (two) times daily with a meal.    . nicotine (NICODERM CQ - DOSED IN MG/24 HR) 7 mg/24hr patch Place 1 patch (7 mg total) onto the skin daily. 28 patch 0  . PROAIR HFA 108 (90 Base) MCG/ACT inhaler Inhale 2 puffs into the lungs every 6 (six) hours as needed for wheezing or shortness of breath.      Labs  Lab Results:  Admission on 02/18/2022  Component Date Value Ref Range Status  . Glucose-Capillary 02/18/2022 254 (H)  70 - 99 mg/dL Final   Glucose reference range applies only to samples taken after fasting for at least 8 hours.  Admission on 02/17/2022, Discharged on 02/18/2022  Component Date Value Ref Range Status  . Sodium 02/17/2022 136  135 - 145 mmol/L Final  . Potassium 02/17/2022 3.9  3.5 - 5.1 mmol/L Final  . Chloride 02/17/2022 101  98 - 111 mmol/L Final  . CO2 02/17/2022 25  22 - 32 mmol/L Final  . Glucose, Bld 02/17/2022 113 (H)  70 - 99 mg/dL Final   Glucose reference range applies only to samples taken after fasting for at least 8 hours.  . BUN 02/17/2022 9  6 - 20 mg/dL Final  . Creatinine, Ser 02/17/2022 0.50  0.44 - 1.00 mg/dL Final  . Calcium 02/17/2022 8.9  8.9 - 10.3 mg/dL Final  . GFR, Estimated 02/17/2022 >60  >60 mL/min Final   Comment: (NOTE) Calculated using the CKD-EPI Creatinine Equation (2021)   . Anion gap 02/17/2022 10  5 - 15 Final   Performed at [facility], [place]  [address] [place], [place] [address]  . WBC 02/17/2022 6.5  4.0 - 10.5 K/uL Final  . RBC 02/17/2022 4.24  3.87 - 5.11 MIL/uL Final  . Hemoglobin 02/17/2022 10.2 (L)  12.0 - 15.0 g/dL Final  . HCT 02/17/2022 33.2 (L)  36.0 - 46.0 % Final  . MCV 02/17/2022 78.3 (L)  80.0 - 100.0 fL Final  . MCH 02/17/2022 24.1 (L)  26.0 - 34.0 pg Final  . MCHC 02/17/2022 30.7  30.0 - 36.0 g/dL Final  . RDW 02/17/2022 17.2 (H)  11.5 - 15.5 % Final  .  Platelets 02/17/2022 390  150 - 400 K/uL Final  . nRBC 02/17/2022 0.0  0.0 - 0.2 % Final   Performed at [facility] [address], [place], [place] [address]  . Troponin I (High Sensitivity) 02/17/2022 4  <18 ng/L Final   Comment: (NOTE) Elevated high sensitivity troponin I (hsTnI) values and significant  changes across serial measurements may suggest ACS but many other  chronic and acute conditions are known to elevate hsTnI results.  Refer to the "Links" section for chest pain algorithms and additional  guidance. Performed at [facility], [place] [address], [place], [place] [address]   . Valproic Acid Lvl 02/17/2022 17 (L)  50.0 - 100.0 ug/mL Final   Performed at [facility] [address], [place], [place] [address]  . B Natriuretic Peptide 02/17/2022 30.9  0.0 - 100.0 pg/mL Final   Performed at [facility], [place] [address] [place], [place] [address]  . Troponin I (High Sensitivity) 02/18/2022 4  <18 ng/L Final   Comment: (NOTE) Elevated high sensitivity troponin I (hsTnI) values and significant  changes across serial measurements may suggest ACS but many other  chronic and acute conditions are known to elevate hsTnI results.  Refer to the "Links" section for chest pain algorithms and additional  guidance. Performed at [facility], [place] [address] [place], [place] [address]   . D-Dimer, Quant 02/18/2022 0.29  0.00 - 0.50 ug/mL-FEU Final   Comment: (NOTE) At the manufacturer cut-off value of 0.5 g/mL FEU, this assay has a negative predictive value of 95-100%.This assay is intended for use in conjunction with a clinical pretest probability (PTP) assessment model to exclude pulmonary embolism (PE) and deep venous thrombosis (DVT) in outpatients suspected of PE or DVT. Results should be correlated with clinical presentation. Performed at [facility], [place] [address], [place], [place] [address]   Admission on 02/09/2022, Discharged on  02/17/2022  Component Date Value Ref Range Status  . Glucose-Capillary 02/09/2022 118 (H)  70 - 99 mg/dL Final   Glucose reference range applies only to samples taken after fasting for at least 8 hours.  . Glucose-Capillary 02/10/2022 148 (H)  70 - 99 mg/dL Final   Glucose reference range applies only to samples taken after fasting for at least 8 hours.  . Glucose-Capillary 02/10/2022 174 (H)  70 - 99 mg/dL Final   Glucose reference range applies only to samples taken after fasting for at least 8 hours.  . Glucose-Capillary 02/10/2022 128 (H)  70 - 99 mg/dL Final   Glucose reference range applies only to samples taken after fasting for at least 8 hours.  . Glucose-Capillary 02/10/2022 182 (H)  70 - 99 mg/dL Final   Glucose reference range applies only to samples taken after fasting for at least 8 hours.  . Glucose-Capillary 02/11/2022 158 (H)  70 - 99  mg/dL Final   Glucose reference range applies only to samples taken after fasting for at least 8 hours.  . Glucose-Capillary 02/11/2022 159 (H)  70 - 99 mg/dL Final   Glucose reference range applies only to samples taken after fasting for at least 8 hours.  . Glucose-Capillary 02/11/2022 153 (H)  70 - 99 mg/dL Final   Glucose reference range applies only to samples taken after fasting for at least 8 hours.  . Glucose-Capillary 02/11/2022 159 (H)  70 - 99 mg/dL Final   Glucose reference range applies only to samples taken after fasting for at least 8 hours.  . Glucose-Capillary 02/11/2022 153 (H)  70 - 99 mg/dL Final   Glucose reference range applies only to samples taken after fasting for at least 8 hours.  . Glucose-Capillary 02/11/2022 109 (H)  70 - 99 mg/dL Final   Glucose reference range applies only to samples taken after fasting for at least 8 hours.  . Glucose-Capillary 02/11/2022 213 (H)  70 - 99 mg/dL Final   Glucose reference range applies only to samples taken after fasting for at least 8 hours.  . Glucose-Capillary 02/11/2022 229  (H)  70 - 99 mg/dL Final   Glucose reference range applies only to samples taken after fasting for at least 8 hours.  . Glucose-Capillary 02/12/2022 128 (H)  70 - 99 mg/dL Final   Glucose reference range applies only to samples taken after fasting for at least 8 hours.  . Glucose-Capillary 02/12/2022 149 (H)  70 - 99 mg/dL Final   Glucose reference range applies only to samples taken after fasting for at least 8 hours.  . Color, Urine 02/12/2022 YELLOW  YELLOW Final  . APPearance 02/12/2022 CLEAR  CLEAR Final  . Specific Gravity, Urine 02/12/2022 1.015  1.005 - 1.030 Final  . pH 02/12/2022 5.0  5.0 - 8.0 Final  . Glucose, UA 02/12/2022 NEGATIVE  NEGATIVE mg/dL Final  . Hgb urine dipstick 02/12/2022 NEGATIVE  NEGATIVE Final  . Bilirubin Urine 02/12/2022 NEGATIVE  NEGATIVE Final  . Ketones, ur 02/12/2022 NEGATIVE  NEGATIVE mg/dL Final  . Protein, ur 02/12/2022 NEGATIVE  NEGATIVE mg/dL Final  . Nitrite 02/12/2022 NEGATIVE  NEGATIVE Final  . Chalmers Guest 02/12/2022 NEGATIVE  NEGATIVE Final   Performed at [facility], [place] [address], [place], [place] [address]  . Specimen Description 02/12/2022 URINE, CLEAN CATCH   Final  . Special Requests 02/12/2022    Final                   Value:NONE Performed at [facility], [place] [address] [place],  [address]   . Culture 02/12/2022 10,000 COLONIES/mL PROTEUS MIRABILIS (A)   Final  . Report Status 02/12/2022 02/15/2022 FINAL   Final  . Organism ID, Bacteria 02/12/2022 PROTEUS MIRABILIS (A)   Final  . Glucose-Capillary 02/12/2022 128 (H)  70 - 99 mg/dL Final   Glucose reference range applies only to samples taken after fasting for at least 8 hours.  . Glucose-Capillary 02/12/2022 196 (H)  70 - 99 mg/dL Final   Glucose reference range applies only to samples taken after fasting for at least 8 hours.  . Glucose-Capillary 02/13/2022 168 (H)  70 - 99 mg/dL Final   Glucose reference range applies only to samples taken after fasting for  at least 8 hours.  . Glucose-Capillary 02/13/2022 153 (H)  70 - 99 mg/dL Final   Glucose reference range applies only to samples taken after fasting for  at least 8 hours.  . Glucose-Capillary 02/13/2022 134 (H)  70 - 99 mg/dL Final   Glucose reference range applies only to samples taken after fasting for at least 8 hours.  . Glucose-Capillary 02/13/2022 178 (H)  70 - 99 mg/dL Final   Glucose reference range applies only to samples taken after fasting for at least 8 hours.  . Glucose-Capillary 02/14/2022 156 (H)  70 - 99 mg/dL Final   Glucose reference range applies only to samples taken after fasting for at least 8 hours.  . Glucose-Capillary 02/14/2022 169 (H)  70 - 99 mg/dL Final   Glucose reference range applies only to samples taken after fasting for at least 8 hours.  . Glucose-Capillary 02/14/2022 156 (H)  70 - 99 mg/dL Final   Glucose reference range applies only to samples taken after fasting for at least 8 hours.  . Glucose-Capillary 02/14/2022 148 (H)  70 - 99 mg/dL Final   Glucose reference range applies only to samples taken after fasting for at least 8 hours.  . Glucose-Capillary 02/15/2022 159 (H)  70 - 99 mg/dL Final   Glucose reference range applies only to samples taken after fasting for at least 8 hours.  . Glucose-Capillary 02/15/2022 125 (H)  70 - 99 mg/dL Final   Glucose reference range applies only to samples taken after fasting for at least 8 hours.  . Glucose-Capillary 02/15/2022 142 (H)  70 - 99 mg/dL Final   Glucose reference range applies only to samples taken after fasting for at least 8 hours.  . Glucose-Capillary 02/15/2022 128 (H)  70 - 99 mg/dL Final   Glucose reference range applies only to samples taken after fasting for at least 8 hours.  . Glucose-Capillary 02/16/2022 137 (H)  70 - 99 mg/dL Final   Glucose reference range applies only to samples taken after fasting for at least 8 hours.  . WBC 02/16/2022 6.0  4.0 - 10.5 K/uL Final  . RBC 02/16/2022 4.48   3.87 - 5.11 MIL/uL Final  . Hemoglobin 02/16/2022 10.7 (L)  12.0 - 15.0 g/dL Final  . HCT 02/16/2022 35.4 (L)  36.0 - 46.0 % Final  . MCV 02/16/2022 79.0 (L)  80.0 - 100.0 fL Final  . MCH 02/16/2022 23.9 (L)  26.0 - 34.0 pg Final  . MCHC 02/16/2022 30.2  30.0 - 36.0 g/dL Final  . RDW 02/16/2022 17.0 (H)  11.5 - 15.5 % Final  . Platelets 02/16/2022 387  150 - 400 K/uL Final  . nRBC 02/16/2022 0.0  0.0 - 0.2 % Final  . Neutrophils Relative % 02/16/2022 54  % Final  . Neutro Abs 02/16/2022 3.2  1.7 - 7.7 K/uL Final  . Lymphocytes Relative 02/16/2022 35  % Final  . Lymphs Abs 02/16/2022 2.1  0.7 - 4.0 K/uL Final  . Monocytes Relative 02/16/2022 6  % Final  . Monocytes Absolute 02/16/2022 0.4  0.1 - 1.0 K/uL Final  . Eosinophils Relative 02/16/2022 2  % Final  . Eosinophils Absolute 02/16/2022 0.1  0.0 - 0.5 K/uL Final  . Basophils Relative 02/16/2022 2  % Final  . Basophils Absolute 02/16/2022 0.1  0.0 - 0.1 K/uL Final  . Immature Granulocytes 02/16/2022 1  % Final  . Abs Immature Granulocytes 02/16/2022 0.04  0.00 - 0.07 K/uL Final   Performed at [facility], [place] [address] [place], [place] [address]  . Glucose-Capillary 02/16/2022 131 (H)  70 - 99 mg/dL Final   Glucose reference range applies  only to samples taken after fasting for at least 8 hours.  . Glucose-Capillary 02/16/2022 123 (H)  70 - 99 mg/dL Final   Glucose reference range applies only to samples taken after fasting for at least 8 hours.  . Glucose-Capillary 02/16/2022 195 (H)  70 - 99 mg/dL Final   Glucose reference range applies only to samples taken after fasting for at least 8 hours.  . Glucose-Capillary 02/17/2022 148 (H)  70 - 99 mg/dL Final   Glucose reference range applies only to samples taken after fasting for at least 8 hours.  Admission on 02/08/2022, Discharged on 02/09/2022  Component Date Value Ref Range Status  . B Natriuretic Peptide 02/08/2022 40.2  0.0 - 100.0 pg/mL Final   Performed at [facility], [place] [address], [place], [place] [address]  . Troponin I (High Sensitivity) 02/08/2022 4  <18 ng/L Final   Comment: (NOTE) Elevated high sensitivity troponin I (hsTnI) values and significant  changes across serial measurements may suggest ACS but many other  chronic and acute conditions are known to elevate hsTnI results.  Refer to the "Links" section for chest pain algorithms and additional  guidance. Performed at [facility], [place] [address], [place], [place] [address]   . WBC 02/08/2022 8.5  4.0 - 10.5 K/uL Final  . RBC 02/08/2022 4.30  3.87 - 5.11 MIL/uL Final  . Hemoglobin 02/08/2022 10.7 (L)  12.0 - 15.0 g/dL Final  . HCT 02/08/2022 33.1 (L)  36.0 - 46.0 % Final  . MCV 02/08/2022 77.0 (L)  80.0 - 100.0 fL Final  . MCH 02/08/2022 24.9 (L)  26.0 - 34.0 pg Final  . MCHC 02/08/2022 32.3  30.0 - 36.0 g/dL Final  . RDW 02/08/2022 16.5 (H)  11.5 - 15.5 % Final  . Platelets 02/08/2022 361  150 - 400 K/uL Final  . nRBC 02/08/2022 0.0  0.0 - 0.2 % Final   Performed at [facility] [address] [place], [place] [address]  . Sodium 02/08/2022 131 (L)  135 - 145 mmol/L Final  . Potassium 02/08/2022 4.4  3.5 - 5.1 mmol/L Final  . Chloride 02/08/2022 96 (L)  98 - 111 mmol/L Final  . CO2 02/08/2022 25  22 - 32 mmol/L Final  . Glucose, Bld 02/08/2022 126 (H)  70 - 99 mg/dL Final   Glucose reference range applies only to samples taken after fasting for at least 8 hours.  . BUN 02/08/2022 11  6 - 20 mg/dL Final  . Creatinine, Ser 02/08/2022 0.52  0.44 - 1.00 mg/dL Final  . Calcium 02/08/2022 9.4  8.9 - 10.3 mg/dL Final  . Total Protein 02/08/2022 7.5  6.5 - 8.1 g/dL Final  . Albumin 02/08/2022 3.6  3.5 - 5.0 g/dL Final  . AST 02/08/2022 22  15 - 41 U/L Final  . ALT 02/08/2022 20  0 - 44 U/L Final  . Alkaline Phosphatase 02/08/2022 67  38 - 126 U/L Final  . Total Bilirubin 02/08/2022 0.1 (L)  0.3 - 1.2 mg/dL Final  . GFR, Estimated 02/08/2022 >60  >60 mL/min Final    Comment: (NOTE) Calculated using the CKD-EPI Creatinine Equation (2021)   . Anion gap 02/08/2022 10  5 - 15 Final   Performed at [facility], [place] [address] [place], [place] [address]  . Troponin I (High Sensitivity) 02/08/2022 5  <18 ng/L Final   Comment: (NOTE) Elevated high sensitivity troponin I (hsTnI) values and significant  changes across serial measurements may suggest ACS but many other  chronic and acute conditions are known to elevate hsTnI results.  Refer to the "Links" section for chest pain algorithms and additional  guidance. Performed at [facility], [place] [address] [place], [place] [address]   . WBC 02/09/2022 7.3  4.0 - 10.5 K/uL Final  . RBC 02/09/2022 4.70  3.87 - 5.11 MIL/uL Final  . Hemoglobin 02/09/2022 11.4 (L)  12.0 - 15.0 g/dL Final  . HCT 02/09/2022 36.6  36.0 - 46.0 % Final  . MCV 02/09/2022 77.9 (L)  80.0 - 100.0 fL Final  . MCH 02/09/2022 24.3 (L)  26.0 - 34.0 pg Final  . MCHC 02/09/2022 31.1  30.0 - 36.0 g/dL Final  . RDW 02/09/2022 16.6 (H)  11.5 - 15.5 % Final  . Platelets 02/09/2022 403 (H)  150 - 400 K/uL Final  . nRBC 02/09/2022 0.0  0.0 - 0.2 % Final   Performed at [facility] [address], [place], [place] [address]  . Glucose-Capillary 02/08/2022 117 (H)  70 - 99 mg/dL Final   Glucose reference range applies only to samples taken after fasting for at least 8 hours.  . Sodium 02/09/2022 138  135 - 145 mmol/L Final  . Potassium 02/09/2022 3.7  3.5 - 5.1 mmol/L Final  . Chloride 02/09/2022 103  98 - 111 mmol/L Final  . CO2 02/09/2022 24  22 - 32 mmol/L Final  . Glucose, Bld 02/09/2022 134 (H)  70 - 99 mg/dL Final   Glucose reference range applies only to samples taken after fasting for at least 8 hours.  . BUN 02/09/2022 9  6 - 20 mg/dL Final  . Creatinine, Ser 02/09/2022 0.44  0.44 - 1.00 mg/dL Final  . Calcium 02/09/2022 8.3 (L)  8.9 - 10.3 mg/dL Final  . GFR, Estimated 02/09/2022 >60  >60 mL/min Final   Comment:  (NOTE) Calculated using the CKD-EPI Creatinine Equation (2021)   . Anion gap 02/09/2022 11  5 - 15 Final   Performed at [facility], [place] [address] [place], [place] [address]  . Iron 02/09/2022 20 (L)  28 - 170 ug/dL Final  . TIBC 02/09/2022 455 (H)  250 - 450 ug/dL Final  . Saturation Ratios 02/09/2022 4 (L)  10.4 - 31.8 % Final  . UIBC 02/09/2022 435  ug/dL Final   Performed at [facility], [place] [address], [place], [place] [address]  . Ferritin 02/09/2022 2 (L)  11 - 307 ng/mL Final   Performed at [facility], [place] [address], [place], [place] [address]  . Weight 02/09/2022 2,846.58  oz Final  . Height 02/09/2022 62  in Final  . BP 02/09/2022 143/80  mmHg Final  . WBC 02/09/2022 8.1  4.0 - 10.5 K/uL Final  . RBC 02/09/2022 4.38  3.87 - 5.11 MIL/uL Final  . Hemoglobin 02/09/2022 10.9 (L)  12.0 - 15.0 g/dL Final  . HCT 02/09/2022 33.3 (L)  36.0 - 46.0 % Final  . MCV 02/09/2022 76.0 (L)  80.0 - 100.0 fL Final  . MCH 02/09/2022 24.9 (L)  26.0 - 34.0 pg Final  . MCHC 02/09/2022 32.7  30.0 - 36.0 g/dL Final  . RDW 02/09/2022 16.5 (H)  11.5 - 15.5 % Final  . Platelets 02/09/2022 397  150 - 400 K/uL Final  . nRBC 02/09/2022 0.0  0.0 - 0.2 % Final  . Neutrophils Relative % 02/09/2022 66  %  Final  . Neutro Abs 02/09/2022 5.4  1.7 - 7.7 K/uL Final  . Lymphocytes Relative 02/09/2022 24  % Final  . Lymphs Abs 02/09/2022 1.9  0.7 - 4.0 K/uL Final  . Monocytes Relative 02/09/2022 8  % Final  . Monocytes Absolute 02/09/2022 0.6  0.1 - 1.0 K/uL Final  . Eosinophils Relative 02/09/2022 1  % Final  . Eosinophils Absolute 02/09/2022 0.1  0.0 - 0.5 K/uL Final  . Basophils Relative 02/09/2022 1  % Final  . Basophils Absolute 02/09/2022 0.1  0.0 - 0.1 K/uL Final  . Immature Granulocytes 02/09/2022 0  % Final  . Abs Immature Granulocytes 02/09/2022 0.03  0.00 - 0.07 K/uL Final   Performed at [facility] [address] [place], [place] [address]  . Glucose-Capillary  02/09/2022 238 (H)  70 - 99 mg/dL Final   Glucose reference range applies only to samples taken after fasting for at least 8 hours.  . Glucose-Capillary 02/09/2022 91  70 - 99 mg/dL Final   Glucose reference range applies only to samples taken after fasting for at least 8 hours.  . CRP 02/09/2022 <0.5  <1.0 mg/dL Final   Performed at [facility], [place] [address], [place], [place] [address]  . Sed Rate 02/09/2022 15  0 - 22 mm/hr Final   Performed at [facility], [place] [address], [place], [place] [address]  . Glucose-Capillary 02/09/2022 137 (H)  70 - 99 mg/dL Final   Glucose reference range applies only to samples taken after fasting for at least 8 hours.  Admission on 01/24/2022, Discharged on 02/08/2022  Component Date Value Ref Range Status  . Glucose-Capillary 01/24/2022 143 (H)  70 - 99 mg/dL Final   Glucose reference range applies only to samples taken after fasting for at least 8 hours.  . Glucose-Capillary 01/24/2022 120 (H)  70 - 99 mg/dL Final   Glucose reference range applies only to samples taken after fasting for at least 8 hours.  . Glucose-Capillary 01/25/2022 122 (H)  70 - 99 mg/dL Final   Glucose reference range applies only to samples taken after fasting for at least 8 hours.  . Glucose-Capillary 01/25/2022 190 (H)  70 - 99 mg/dL Final   Glucose reference range applies only to samples taken after fasting for at least 8 hours.  . Glucose-Capillary 01/25/2022 163 (H)  70 - 99 mg/dL Final   Glucose reference range applies only to samples taken after fasting for at least 8 hours.  . WBC 01/26/2022 6.5  4.0 - 10.5 K/uL Final  . RBC 01/26/2022 4.53  3.87 - 5.11 MIL/uL Final  . Hemoglobin 01/26/2022 11.3 (L)  12.0 - 15.0 g/dL Final  . HCT 01/26/2022 36.4  36.0 - 46.0 % Final  . MCV 01/26/2022 80.4  80.0 - 100.0 fL Final  . MCH 01/26/2022 24.9 (L)  26.0 - 34.0 pg Final  . MCHC 01/26/2022 31.0  30.0 - 36.0 g/dL Final  . RDW 01/26/2022 17.3 (H)  11.5 - 15.5 % Final   . Platelets 01/26/2022 327  150 - 400 K/uL Final  . nRBC 01/26/2022 0.0  0.0 - 0.2 % Final  . Neutrophils Relative % 01/26/2022 60  % Final  . Neutro Abs 01/26/2022 3.9  1.7 - 7.7 K/uL Final  . Lymphocytes Relative 01/26/2022 31  % Final  . Lymphs Abs 01/26/2022 2.0  0.7 - 4.0 K/uL Final  . Monocytes Relative 01/26/2022 7  % Final  . Monocytes  Absolute 01/26/2022 0.5  0.1 - 1.0 K/uL Final  . Eosinophils Relative 01/26/2022 1  % Final  . Eosinophils Absolute 01/26/2022 0.1  0.0 - 0.5 K/uL Final  . Basophils Relative 01/26/2022 1  % Final  . Basophils Absolute 01/26/2022 0.1  0.0 - 0.1 K/uL Final  . Immature Granulocytes 01/26/2022 0  % Final  . Abs Immature Granulocytes 01/26/2022 0.02  0.00 - 0.07 K/uL Final   Performed at [facility], [place] [address], [place], [place] [address]  . Glucose-Capillary 01/26/2022 149 (H)  70 - 99 mg/dL Final   Glucose reference range applies only to samples taken after fasting for at least 8 hours.  . Glucose-Capillary 01/26/2022 130 (H)  70 - 99 mg/dL Final   Glucose reference range applies only to samples taken after fasting for at least 8 hours.  . Glucose-Capillary 01/26/2022 151 (H)  70 - 99 mg/dL Final   Glucose reference range applies only to samples taken after fasting for at least 8 hours.  . Glucose-Capillary 01/26/2022 170 (H)  70 - 99 mg/dL Final   Glucose reference range applies only to samples taken after fasting for at least 8 hours.  . Glucose-Capillary 01/27/2022 129 (H)  70 - 99 mg/dL Final   Glucose reference range applies only to samples taken after fasting for at least 8 hours.  . Glucose-Capillary 01/27/2022 101 (H)  70 - 99 mg/dL Final   Glucose reference range applies only to samples taken after fasting for at least 8 hours.  . Glucose-Capillary 01/27/2022 220 (H)  70 - 99 mg/dL Final   Glucose reference range applies only to samples taken after fasting for at least 8 hours.  . Glucose-Capillary 01/27/2022 154 (H)  70 - 99  mg/dL Final   Glucose reference range applies only to samples taken after fasting for at least 8 hours.  . Glucose-Capillary 01/27/2022 163 (H)  70 - 99 mg/dL Final   Glucose reference range applies only to samples taken after fasting for at least 8 hours.  . Glucose-Capillary 01/28/2022 127 (H)  70 - 99 mg/dL Final   Glucose reference range applies only to samples taken after fasting for at least 8 hours.  . Glucose-Capillary 01/28/2022 110 (H)  70 - 99 mg/dL Final   Glucose reference range applies only to samples taken after fasting for at least 8 hours.  . Glucose-Capillary 01/28/2022 167 (H)  70 - 99 mg/dL Final   Glucose reference range applies only to samples taken after fasting for at least 8 hours.  . Glucose-Capillary 01/29/2022 145 (H)  70 - 99 mg/dL Final   Glucose reference range applies only to samples taken after fasting for at least 8 hours.  . Glucose-Capillary 01/29/2022 203 (H)  70 - 99 mg/dL Final   Glucose reference range applies only to samples taken after fasting for at least 8 hours.  . Glucose-Capillary 01/29/2022 153 (H)  70 - 99 mg/dL Final   Glucose reference range applies only to samples taken after fasting for at least 8 hours.  . Glucose-Capillary 01/29/2022 166 (H)  70 - 99 mg/dL Final   Glucose reference range applies only to samples taken after fasting for at least 8 hours.  . Glucose-Capillary 01/30/2022 142 (H)  70 - 99 mg/dL Final   Glucose reference range applies only to samples taken after fasting for at least 8 hours.  . Glucose-Capillary 01/30/2022 138 (H)  70 - 99 mg/dL Final   Glucose reference range applies  only to samples taken after fasting for at least 8 hours.  . Glucose-Capillary 01/30/2022 185 (H)  70 - 99 mg/dL Final   Glucose reference range applies only to samples taken after fasting for at least 8 hours.  . Glucose-Capillary 01/30/2022 220 (H)  70 - 99 mg/dL Final   Glucose reference range applies only to samples taken after fasting for at  least 8 hours.  . Glucose-Capillary 01/31/2022 147 (H)  70 - 99 mg/dL Final   Glucose reference range applies only to samples taken after fasting for at least 8 hours.  . Glucose-Capillary 01/31/2022 131 (H)  70 - 99 mg/dL Final   Glucose reference range applies only to samples taken after fasting for at least 8 hours.  . Glucose-Capillary 01/31/2022 169 (H)  70 - 99 mg/dL Final   Glucose reference range applies only to samples taken after fasting for at least 8 hours.  . Glucose-Capillary 01/31/2022 144 (H)  70 - 99 mg/dL Final   Glucose reference range applies only to samples taken after fasting for at least 8 hours.  . Comment 1 01/31/2022 RN AWARE   Final  . Glucose-Capillary 02/01/2022 117 (H)  70 - 99 mg/dL Final   Glucose reference range applies only to samples taken after fasting for at least 8 hours.  . Glucose-Capillary 02/01/2022 180 (H)  70 - 99 mg/dL Final   Glucose reference range applies only to samples taken after fasting for at least 8 hours.  . Glucose-Capillary 02/01/2022 209 (H)  70 - 99 mg/dL Final   Glucose reference range applies only to samples taken after fasting for at least 8 hours.  . Glucose-Capillary 02/02/2022 131 (H)  70 - 99 mg/dL Final   Glucose reference range applies only to samples taken after fasting for at least 8 hours.  . WBC 02/02/2022 7.5  4.0 - 10.5 K/uL Final  . RBC 02/02/2022 4.52  3.87 - 5.11 MIL/uL Final  . Hemoglobin 02/02/2022 11.3 (L)  12.0 - 15.0 g/dL Final  . HCT 02/02/2022 35.0 (L)  36.0 - 46.0 % Final  . MCV 02/02/2022 77.4 (L)  80.0 - 100.0 fL Final  . MCH 02/02/2022 25.0 (L)  26.0 - 34.0 pg Final  . MCHC 02/02/2022 32.3  30.0 - 36.0 g/dL Final  . RDW 02/02/2022 16.7 (H)  11.5 - 15.5 % Final  . Platelets 02/02/2022 345  150 - 400 K/uL Final  . nRBC 02/02/2022 0.0  0.0 - 0.2 % Final  . Neutrophils Relative % 02/02/2022 63  % Final  . Neutro Abs 02/02/2022 4.7  1.7 - 7.7 K/uL Final  . Lymphocytes Relative 02/02/2022 28  % Final  .  Lymphs Abs 02/02/2022 2.1  0.7 - 4.0 K/uL Final  . Monocytes Relative 02/02/2022 7  % Final  . Monocytes Absolute 02/02/2022 0.5  0.1 - 1.0 K/uL Final  . Eosinophils Relative 02/02/2022 1  % Final  . Eosinophils Absolute 02/02/2022 0.1  0.0 - 0.5 K/uL Final  . Basophils Relative 02/02/2022 1  % Final  . Basophils Absolute 02/02/2022 0.1  0.0 - 0.1 K/uL Final  . Immature Granulocytes 02/02/2022 0  % Final  . Abs Immature Granulocytes 02/02/2022 0.03  0.00 - 0.07 K/uL Final   Performed at [facility], [place] [address], [place], [place] [address]  . Glucose-Capillary 02/02/2022 95  70 - 99 mg/dL Final   Glucose reference range applies only to samples taken after fasting for at least 8 hours.  Marland Kitchen  Glucose-Capillary 02/02/2022 120 (H)  70 - 99 mg/dL Final   Glucose reference range applies only to samples taken after fasting for at least 8 hours.  . Glucose-Capillary 02/02/2022 191 (H)  70 - 99 mg/dL Final   Glucose reference range applies only to samples taken after fasting for at least 8 hours.  . Glucose-Capillary 02/03/2022 148 (H)  70 - 99 mg/dL Final   Glucose reference range applies only to samples taken after fasting for at least 8 hours.  . Glucose-Capillary 02/03/2022 122 (H)  70 - 99 mg/dL Final   Glucose reference range applies only to samples taken after fasting for at least 8 hours.  . Glucose-Capillary 02/03/2022 233 (H)  70 - 99 mg/dL Final   Glucose reference range applies only to samples taken after fasting for at least 8 hours.  . Glucose-Capillary 02/04/2022 126 (H)  70 - 99 mg/dL Final   Glucose reference range applies only to samples taken after fasting for at least 8 hours.  . Glucose-Capillary 02/04/2022 117 (H)  70 - 99 mg/dL Final   Glucose reference range applies only to samples taken after fasting for at least 8 hours.  . Glucose-Capillary 02/04/2022 135 (H)  70 - 99 mg/dL Final   Glucose reference range applies only to samples taken after fasting for at least 8  hours.  . Glucose-Capillary 02/04/2022 197 (H)  70 - 99 mg/dL Final   Glucose reference range applies only to samples taken after fasting for at least 8 hours.  . Glucose-Capillary 02/05/2022 170 (H)  70 - 99 mg/dL Final   Glucose reference range applies only to samples taken after fasting for at least 8 hours.  . Glucose-Capillary 02/05/2022 107 (H)  70 - 99 mg/dL Final   Glucose reference range applies only to samples taken after fasting for at least 8 hours.  . Glucose-Capillary 02/05/2022 184 (H)  70 - 99 mg/dL Final   Glucose reference range applies only to samples taken after fasting for at least 8 hours.  . Glucose-Capillary 02/05/2022 256 (H)  70 - 99 mg/dL Final   Glucose reference range applies only to samples taken after fasting for at least 8 hours.  . Glucose-Capillary 02/06/2022 144 (H)  70 - 99 mg/dL Final   Glucose reference range applies only to samples taken after fasting for at least 8 hours.  . Glucose-Capillary 02/06/2022 190 (H)  70 - 99 mg/dL Final   Glucose reference range applies only to samples taken after fasting for at least 8 hours.  . Glucose-Capillary 02/06/2022 92  70 - 99 mg/dL Final   Glucose reference range applies only to samples taken after fasting for at least 8 hours.  . Trichomonas 02/06/2022 Negative   Final  . Bacterial Vaginitis-Urine 02/06/2022 Negative   Final  . Molecular Comment 02/06/2022 For tests bacteria and/or candida, this specimen does not meet the   Final  . Molecular Comment 02/06/2022 strict criteria set by the FDA. The result interpretation should be   Final  . Molecular Comment 02/06/2022 considered in conjunction with the patient's clinical history.   Final  . Comment 02/06/2022 Normal Reference Range Trichomonas - Negative   Final  . Glucose-Capillary 02/06/2022 197 (H)  70 - 99 mg/dL Final   Glucose reference range applies only to samples taken after fasting for at least 8 hours.  . Glucose-Capillary 02/07/2022 129 (H)  70 - 99  mg/dL Final   Glucose reference range applies only to samples taken after fasting  for at least 8 hours.  . Glucose-Capillary 02/07/2022 207 (H)  70 - 99 mg/dL Final   Glucose reference range applies only to samples taken after fasting for at least 8 hours.  . Glucose-Capillary 02/07/2022 153 (H)  70 - 99 mg/dL Final   Glucose reference range applies only to samples taken after fasting for at least 8 hours.  . Glucose-Capillary 02/08/2022 129 (H)  70 - 99 mg/dL Final   Glucose reference range applies only to samples taken after fasting for at least 8 hours.  . Glucose-Capillary 02/08/2022 107 (H)  70 - 99 mg/dL Final   Glucose reference range applies only to samples taken after fasting for at least 8 hours.  Admission on 01/23/2022, Discharged on 01/24/2022  Component Date Value Ref Range Status  . Sodium 01/23/2022 140  135 - 145 mmol/L Final  . Potassium 01/23/2022 4.4  3.5 - 5.1 mmol/L Final  . Chloride 01/23/2022 101  98 - 111 mmol/L Final  . CO2 01/23/2022 25  22 - 32 mmol/L Final  . Glucose, Bld 01/23/2022 198 (H)  70 - 99 mg/dL Final   Glucose reference range applies only to samples taken after fasting for at least 8 hours.  . BUN 01/23/2022 13  6 - 20 mg/dL Final  . Creatinine, Ser 01/23/2022 0.62  0.44 - 1.00 mg/dL Final  . Calcium 01/23/2022 9.3  8.9 - 10.3 mg/dL Final  . GFR, Estimated 01/23/2022 >60  >60 mL/min Final   Comment: (NOTE) Calculated using the CKD-EPI Creatinine Equation (2021)   . Anion gap 01/23/2022 14  5 - 15 Final   Performed at [facility], [place] [address], [place], [place] [address]  . WBC 01/23/2022 5.8  4.0 - 10.5 K/uL Final  . RBC 01/23/2022 4.63  3.87 - 5.11 MIL/uL Final  . Hemoglobin 01/23/2022 11.7 (L)  12.0 - 15.0 g/dL Final  . HCT 01/23/2022 37.4  36.0 - 46.0 % Final  . MCV 01/23/2022 80.8  80.0 - 100.0 fL Final  . MCH 01/23/2022 25.3 (L)  26.0 - 34.0 pg Final  . MCHC 01/23/2022 31.3  30.0 - 36.0 g/dL Final  . RDW 01/23/2022 17.9 (H)  11.5  - 15.5 % Final  . Platelets 01/23/2022 333  150 - 400 K/uL Final  . nRBC 01/23/2022 0.0  0.0 - 0.2 % Final   Performed at [facility] [address], [place], [place] [address]  . Troponin I (High Sensitivity) 01/23/2022 6  <18 ng/L Final   Comment: (NOTE) Elevated high sensitivity troponin I (hsTnI) values and significant  changes across serial measurements may suggest ACS but many other  chronic and acute conditions are known to elevate hsTnI results.  Refer to the "Links" section for chest pain algorithms and additional  guidance. Performed at [facility], [place] [address], [place], [place] [address]   . Troponin I (High Sensitivity) 01/23/2022 5  <18 ng/L Final   Comment: (NOTE) Elevated high sensitivity troponin I (hsTnI) values and significant  changes across serial measurements may suggest ACS but many other  chronic and acute conditions are known to elevate hsTnI results.  Refer to the "Links" section for chest pain algorithms and additional  guidance. Performed at [facility], [place] [address] [place], [place] [address]   . SARS Coronavirus 2 by RT PCR 01/23/2022 NEGATIVE  NEGATIVE Final   Comment: (NOTE) SARS-CoV-2 target nucleic acids are NOT DETECTED.  The SARS-CoV-2 RNA is generally  detectable in upper and lower respiratory specimens during the acute phase of infection. The lowest concentration of SARS-CoV-2 viral copies this assay can detect is 250 copies / mL. A negative result does not preclude SARS-CoV-2 infection and should not be used as the sole basis for treatment or other patient management decisions.  A negative result may occur with improper specimen collection / handling, submission of specimen other than nasopharyngeal swab, presence of viral mutation(s) within the areas targeted by this assay, and inadequate number of viral copies (<250 copies / mL). A negative result must be combined with clinical observations, patient history, and  epidemiological information.  Fact Sheet for Patients:   https://www.patel.info/  Fact Sheet for Healthcare Providers: https://hall.com/  This test is not yet approved or                           cleared by the [place] FDA and has been authorized for detection and/or diagnosis of SARS-CoV-2 by FDA under an Emergency Use Authorization (EUA).  This EUA will remain in effect (meaning this test can be used) for the duration of the COVID-19 declaration under Section 564(b)(1) of the Act, 21 U.S.C. section 360bbb-3(b)(1), unless the authorization is terminated or revoked sooner.  Performed at [facility], [place] [address], [place], [place] [address]   . B Natriuretic Peptide 01/23/2022 41.3  0.0 - 100.0 pg/mL Final   Performed at [facility], [place] [address], [place], [place] [address]  . Group A Strep by PCR 01/23/2022 NOT DETECTED  NOT DETECTED Final   Performed at [facility], [place] [address], [place], [place] [address]  . Color, Urine 01/23/2022 YELLOW  YELLOW Final  . APPearance 01/23/2022 CLEAR  CLEAR Final  . Specific Gravity, Urine 01/23/2022 1.018  1.005 - 1.030 Final  . pH 01/23/2022 7.0  5.0 - 8.0 Final  . Glucose, UA 01/23/2022 NEGATIVE  NEGATIVE mg/dL Final  . Hgb urine dipstick 01/23/2022 NEGATIVE  NEGATIVE Final  . Bilirubin Urine 01/23/2022 NEGATIVE  NEGATIVE Final  . Ketones, ur 01/23/2022 NEGATIVE  NEGATIVE mg/dL Final  . Protein, ur 01/23/2022 NEGATIVE  NEGATIVE mg/dL Final  . Nitrite 01/23/2022 NEGATIVE  NEGATIVE Final  . Chalmers Guest 01/23/2022 TRACE (A)  NEGATIVE Final  . RBC / HPF 01/23/2022 0-5  0 - 5 RBC/hpf Final  . WBC, UA 01/23/2022 0-5  0 - 5 WBC/hpf Final  . Bacteria, UA 01/23/2022 NONE SEEN  NONE SEEN Final  . Squamous Epithelial / LPF 01/23/2022 0-5  0 - 5 Final  . Mucus 01/23/2022 PRESENT   Final   Performed at [facility], [place] [address], [place], [place] [address]  . SARS  Coronavirus 2 by RT PCR 01/23/2022 NEGATIVE  NEGATIVE Final   Comment: (NOTE) SARS-CoV-2 target nucleic acids are NOT DETECTED.  The SARS-CoV-2 RNA is generally detectable in upper respiratory specimens during the acute phase of infection. The lowest concentration of SARS-CoV-2 viral copies this assay can detect is 138 copies/mL. A negative result does not preclude SARS-Cov-2 infection and should not be used as the sole basis for treatment or other patient management decisions. A negative result may occur with  improper specimen collection/handling, submission of specimen other than nasopharyngeal swab, presence of viral mutation(s) within the areas targeted by this assay, and inadequate number of viral copies(<138 copies/mL). A negative result must be combined with clinical observations, patient history, and epidemiological information. The expected result  is Negative.  Fact Sheet for Patients:  EntrepreneurPulse.com.au  Fact Sheet for Healthcare Providers:  IncredibleEmployment.be  This test is no                          t yet approved or cleared by the [place] FDA and  has been authorized for detection and/or diagnosis of SARS-CoV-2 by FDA under an Emergency Use Authorization (EUA). This EUA will remain  in effect (meaning this test can be used) for the duration of the COVID-19 declaration under Section 564(b)(1) of the Act, 21 U.S.C.section 360bbb-3(b)(1), unless the authorization is terminated  or revoked sooner.      . Influenza A by PCR 01/23/2022 NEGATIVE  NEGATIVE Final  . Influenza B by PCR 01/23/2022 NEGATIVE  NEGATIVE Final   Comment: (NOTE) The Xpert Xpress SARS-CoV-2/FLU/RSV plus assay is intended as an aid in the diagnosis of influenza from Nasopharyngeal swab specimens and should not be used as a sole basis for treatment. Nasal washings and aspirates are unacceptable for Xpert Xpress SARS-CoV-2/FLU/RSV testing.  Fact  Sheet for Patients: EntrepreneurPulse.com.au  Fact Sheet for Healthcare Providers: IncredibleEmployment.be  This test is not yet approved or cleared by the [place] FDA and has been authorized for detection and/or diagnosis of SARS-CoV-2 by FDA under an Emergency Use Authorization (EUA). This EUA will remain in effect (meaning this test can be used) for the duration of the COVID-19 declaration under Section 564(b)(1) of the Act, 21 U.S.C. section 360bbb-3(b)(1), unless the authorization is terminated or revoked.  Performed at [facility], [place] [address] [place], [place] [address]   . WBC 01/24/2022 6.1  4.0 - 10.5 K/uL Final  . RBC 01/24/2022 4.60  3.87 - 5.11 MIL/uL Final  . Hemoglobin 01/24/2022 11.7 (L)  12.0 - 15.0 g/dL Final  . HCT 01/24/2022 37.0  36.0 - 46.0 % Final  . MCV 01/24/2022 80.4  80.0 - 100.0 fL Final  . MCH 01/24/2022 25.4 (L)  26.0 - 34.0 pg Final  . MCHC 01/24/2022 31.6  30.0 - 36.0 g/dL Final  . RDW 01/24/2022 17.6 (H)  11.5 - 15.5 % Final  . Platelets 01/24/2022 334  150 - 400 K/uL Final  . nRBC 01/24/2022 0.0  0.0 - 0.2 % Final  . Neutrophils Relative % 01/24/2022 53  % Final  . Neutro Abs 01/24/2022 3.3  1.7 - 7.7 K/uL Final  . Lymphocytes Relative 01/24/2022 33  % Final  . Lymphs Abs 01/24/2022 2.0  0.7 - 4.0 K/uL Final  . Monocytes Relative 01/24/2022 11  % Final  . Monocytes Absolute 01/24/2022 0.7  0.1 - 1.0 K/uL Final  . Eosinophils Relative 01/24/2022 2  % Final  . Eosinophils Absolute 01/24/2022 0.1  0.0 - 0.5 K/uL Final  . Basophils Relative 01/24/2022 1  % Final  . Basophils Absolute 01/24/2022 0.1  0.0 - 0.1 K/uL Final  . Immature Granulocytes 01/24/2022 0  % Final  . Abs Immature Granulocytes 01/24/2022 0.02  0.00 - 0.07 K/uL Final   Performed at [facility], [place] [address] [place], [place] [address]  . Glucose-Capillary 01/24/2022 110 (H)  70 - 99 mg/dL Final   Glucose reference range  applies only to samples taken after fasting for at least 8 hours.  No results displayed because visit has over 200 results.    Admission on 11/22/2021, Discharged on 11/22/2021  Component Date Value Ref Range Status  . Glucose-Capillary 11/22/2021 169 (  H)  70 - 99 mg/dL Final   Glucose reference range applies only to samples taken after fasting for at least 8 hours.  No results displayed because visit has over 200 results.    Admission on 10/04/2021, Discharged on 10/22/2021  Component Date Value Ref Range Status  . SARS Coronavirus 2 by RT PCR 10/04/2021 NEGATIVE  NEGATIVE Final   Comment: (NOTE) SARS-CoV-2 target nucleic acids are NOT DETECTED.  The SARS-CoV-2 RNA is generally detectable in upper respiratory specimens during the acute phase of infection. The lowest concentration of SARS-CoV-2 viral copies this assay can detect is 138 copies/mL. A negative result does not preclude SARS-Cov-2 infection and should not be used as the sole basis for treatment or other patient management decisions. A negative result may occur with  improper specimen collection/handling, submission of specimen other than nasopharyngeal swab, presence of viral mutation(s) within the areas targeted by this assay, and inadequate number of viral copies(<138 copies/mL). A negative result must be combined with clinical observations, patient history, and epidemiological information. The expected result is Negative.  Fact Sheet for Patients:  EntrepreneurPulse.com.au  Fact Sheet for Healthcare Providers:  [employer]  This test is no                          t yet approved or cleared by the [place] FDA and  has been authorized for detection and/or diagnosis of SARS-CoV-2 by FDA under an Emergency Use Authorization (EUA). This EUA will remain  in effect (meaning this test can be used) for the duration of the COVID-19 declaration under Section 564(b)(1) of  the Act, 21 U.S.C.section 360bbb-3(b)(1), unless the authorization is terminated  or revoked sooner.      . Influenza A by PCR 10/04/2021 NEGATIVE  NEGATIVE Final  . Influenza B by PCR 10/04/2021 NEGATIVE  NEGATIVE Final   Comment: (NOTE) The Xpert Xpress SARS-CoV-2/FLU/RSV plus assay is intended as an aid in the diagnosis of influenza from Nasopharyngeal swab specimens and should not be used as a sole basis for treatment. Nasal washings and aspirates are unacceptable for Xpert Xpress SARS-CoV-2/FLU/RSV testing.  Fact Sheet for Patients: EntrepreneurPulse.com.au  Fact Sheet for Healthcare Providers: [employer]be  This test is not yet approved or cleared by the [place] FDA and has been authorized for detection and/or diagnosis of SARS-CoV-2 by FDA under an Emergency Use Authorization (EUA). This EUA will remain in effect (meaning this test can be used) for the duration of the COVID-19 declaration under Section 564(b)(1) of the Act, 21 U.S.C. section 360bbb-3(b)(1), unless the authorization is terminated or revoked.  Performed at [facility], [place] [address] [place], [place] [address]   . WBC 10/04/2021 8.4  4.0 - 10.5 K/uL Final  . RBC 10/04/2021 4.42  3.87 - 5.11 MIL/uL Final  . Hemoglobin 10/04/2021 11.6 (L)  12.0 - 15.0 g/dL Final  . HCT 10/04/2021 35.7 (L)  36.0 - 46.0 % Final  . MCV 10/04/2021 80.8  80.0 - 100.0 fL Final  . MCH 10/04/2021 26.2  26.0 - 34.0 pg Final  . MCHC 10/04/2021 32.5  30.0 - 36.0 g/dL Final  . RDW 10/04/2021 16.0 (H)  11.5 - 15.5 % Final  . Platelets 10/04/2021 372  150 - 400 K/uL Final  . nRBC 10/04/2021 0.0  0.0 - 0.2 % Final  . Neutrophils Relative % 10/04/2021 68  % Final  . Neutro Abs 10/04/2021 5.7  1.7 - 7.7 K/uL Final  .  Lymphocytes Relative 10/04/2021 22  % Final  . Lymphs Abs 10/04/2021 1.8  0.7 - 4.0 K/uL Final  . Monocytes Relative 10/04/2021 8  % Final  . Monocytes Absolute  10/04/2021 0.7  0.1 - 1.0 K/uL Final  . Eosinophils Relative 10/04/2021 1  % Final  . Eosinophils Absolute 10/04/2021 0.1  0.0 - 0.5 K/uL Final  . Basophils Relative 10/04/2021 1  % Final  . Basophils Absolute 10/04/2021 0.1  0.0 - 0.1 K/uL Final  . Immature Granulocytes 10/04/2021 0  % Final  . Abs Immature Granulocytes 10/04/2021 0.03  0.00 - 0.07 K/uL Final   Performed at [facility] [address], [place], [place] [address]  . Sodium 10/04/2021 136  135 - 145 mmol/L Final  . Potassium 10/04/2021 4.2  3.5 - 5.1 mmol/L Final  . Chloride 10/04/2021 104  98 - 111 mmol/L Final  . CO2 10/04/2021 25  22 - 32 mmol/L Final  . Glucose, Bld 10/04/2021 100 (H)  70 - 99 mg/dL Final   Glucose reference range applies only to samples taken after fasting for at least 8 hours.  . BUN 10/04/2021 9  6 - 20 mg/dL Final  . Creatinine, Ser 10/04/2021 0.52  0.44 - 1.00 mg/dL Final  . Calcium 10/04/2021 9.0  8.9 - 10.3 mg/dL Final  . Total Protein 10/04/2021 7.0  6.5 - 8.1 g/dL Final  . Albumin 10/04/2021 3.2 (L)  3.5 - 5.0 g/dL Final  . AST 10/04/2021 13 (L)  15 - 41 U/L Final  . ALT 10/04/2021 10  0 - 44 U/L Final  . Alkaline Phosphatase 10/04/2021 61  38 - 126 U/L Final  . Total Bilirubin 10/04/2021 0.3  0.3 - 1.2 mg/dL Final  . GFR, Estimated 10/04/2021 >60  >60 mL/min Final   Comment: (NOTE) Calculated using the CKD-EPI Creatinine Equation (2021)   . Anion gap 10/04/2021 7  5 - 15 Final   Performed at [facility] [address], [place], [place] [address]  . Hgb A1c MFr Bld 10/04/2021 7.0 (H)  4.8 - 5.6 % Final   Comment: (NOTE) Pre diabetes:          5.7%-6.4%  Diabetes:              >6.4%  Glycemic control for   <7.0% adults with diabetes   . Mean Plasma Glucose 10/04/2021 154.2  mg/dL Final   Performed at [facility] [address], [place],  [address]  . Cholesterol 10/04/2021 178  0 - 200 mg/dL Final  . Triglycerides 10/04/2021 155 (H)  <150 mg/dL Final   . HDL 10/04/2021 52  >40 mg/dL Final  . Total CHOL/HDL Ratio 10/04/2021 3.4  RATIO Final  . VLDL 10/04/2021 31  0 - 40 mg/dL Final  . LDL Cholesterol 10/04/2021 95  0 - 99 mg/dL Final   Comment:        Total Cholesterol/HDL:CHD Risk Coronary Heart Disease Risk Table                     Men   Women  1/2 Average Risk   3.4   3.3  Average Risk       5.0   4.4  2 X Average Risk   9.6   7.1  3 X Average Risk  23.4   11.0        Use the calculated Patient Ratio above and the CHD Risk Table to determine  the patient's CHD Risk.        ATP III CLASSIFICATION (LDL):  <100     mg/dL   Optimal  100-129  mg/dL   Near or Above                    Optimal  130-159  mg/dL   Borderline  160-189  mg/dL   High  >190     mg/dL   Very High Performed at [facility] [address] [place], [place] [address]   . POC Amphetamine UR 10/04/2021 None Detected  NONE DETECTED (Cut Off Level 1000 ng/mL) Final  . POC Secobarbital (BAR) 10/04/2021 None Detected  NONE DETECTED (Cut Off Level 300 ng/mL) Final  . POC Buprenorphine (BUP) 10/04/2021 None Detected  NONE DETECTED (Cut Off Level 10 ng/mL) Final  . POC Oxazepam (BZO) 10/04/2021 None Detected  NONE DETECTED (Cut Off Level 300 ng/mL) Final  . POC Cocaine UR 10/04/2021 None Detected  NONE DETECTED (Cut Off Level 300 ng/mL) Final  . POC Methamphetamine UR 10/04/2021 None Detected  NONE DETECTED (Cut Off Level 1000 ng/mL) Final  . POC Morphine 10/04/2021 None Detected  NONE DETECTED (Cut Off Level 300 ng/mL) Final  . POC Methadone UR 10/04/2021 None Detected  NONE DETECTED (Cut Off Level 300 ng/mL) Final  . POC Oxycodone UR 10/04/2021 Positive (A)  NONE DETECTED (Cut Off Level 100 ng/mL) Final  . POC Marijuana UR 10/04/2021 None Detected  NONE DETECTED (Cut Off Level 50 ng/mL) Final  . SARSCOV2ONAVIRUS 2 AG 10/04/2021 NEGATIVE  NEGATIVE Final   Comment: (NOTE) SARS-CoV-2 antigen NOT DETECTED.   Negative results are presumptive.  Negative results  do not preclude SARS-CoV-2 infection and should not be used as the sole basis for treatment or other patient management decisions, including infection  control decisions, particularly in the presence of clinical signs and  symptoms consistent with COVID-19, or in those who have been in contact with the virus.  Negative results must be combined with clinical observations, patient history, and epidemiological information. The expected result is Negative.  Fact Sheet for Patients: HandmadeRecipes.com.cy  Fact Sheet for Healthcare Providers: FuneralLife.at  This test is not yet approved or cleared by the [place] FDA and  has been authorized for detection and/or diagnosis of SARS-CoV-2 by FDA under an Emergency Use Authorization (EUA).  This EUA will remain in effect (meaning this test can be used) for the duration of  the COV                          ID-19 declaration under Section 564(b)(1) of the Act, 21 U.S.C. section 360bbb-3(b)(1), unless the authorization is terminated or revoked sooner.    . Valproic Acid Lvl 10/04/2021 51  50.0 - 100.0 ug/mL Final   Performed at [facility] [address], [place], [place] [address]  . Valproic Acid Lvl 10/08/2021 57  50.0 - 100.0 ug/mL Final   Performed at [facility] [address] [place], [place] [address]  . Glucose-Capillary 10/09/2021 123 (H)  70 - 99 mg/dL Final   Glucose reference range applies only to samples taken after fasting for at least 8 hours.  There may be more visits with results that are not included.    Blood Alcohol level:  Lab Results  Component Value Date   [facility] <10 11/23/2021   ETH <10 40/97/3532    Metabolic Disorder Labs: Lab Results  Component Value Date   HGBA1C 6.7 (H) 12/28/2021   MPG 145.59 12/28/2021   MPG 154.2 10/04/2021   Lab Results  Component Value Date   PROLACTIN 3.8 (L) 11/23/2021   Lab Results  Component Value Date    CHOL 171 11/23/2021   TRIG 125 11/23/2021   HDL 65 11/23/2021   CHOLHDL 2.6 11/23/2021   VLDL 25 11/23/2021   LDLCALC 81 11/23/2021   LDLCALC 95 10/04/2021    Therapeutic Lab Levels: No results found for: "LITHIUM" Lab Results  Component Value Date   VALPROATE 17 (L) 02/17/2022   VALPROATE 33 (L) 01/18/2022   No results found for: "CBMZ"  Physical Findings   PHQ2-9    [facility] from 11/23/2021 in [facility]  PHQ-2 Total Score 2  PHQ-9 Total Score 3      Flowsheet Row ED from 02/18/2022 in [facility] ED from 02/17/2022 in [facility] from 02/09/2022 in [facility] No Risk No Risk No Risk        Musculoskeletal  Strength & Muscle Tone: within normal limits Gait & Station: normal Patient leans: N/A  Psychiatric Specialty Exam  Presentation  General Appearance:  Casual; Disheveled  Eye Contact: Fair  Speech: Clear and Coherent; Normal Rate  Speech Volume: Normal  Handedness: Right   Mood and Affect  Mood: Euthymic  Affect: Constricted; Flat   Thought Process  Thought Processes: Coherent; Goal Directed; Linear  Descriptions of Associations:Intact  Orientation:Full (Time, Place and Person)  Thought Content:Logical  Diagnosis of Schizophrenia or Schizoaffective disorder in past: Yes  Duration of Psychotic Symptoms: No data recorded  Hallucinations:Hallucinations: None  Ideas of Reference:None  Suicidal Thoughts:Suicidal Thoughts: No  Homicidal Thoughts:Homicidal Thoughts: No   Sensorium  Memory: Immediate Good; Recent Good; Remote Good  Judgment: Fair  Insight: Fair   Materials engineer: Fair  Attention Span: Fair  Recall: [employer] of Knowledge: Fair  Language: Fair   Psychomotor Activity  Psychomotor Activity: Psychomotor Activity:  Normal   Assets  Assets: Communication Skills; Desire for Improvement; Financial Resources/Insurance; Resilience   Sleep  Sleep: Sleep: Good   No data recorded  Physical Exam  Physical Exam Constitutional:      General: She is not in acute distress.    Appearance: She is not ill-appearing, toxic-appearing or diaphoretic.  Eyes:     General: No scleral icterus. Cardiovascular:     Rate and Rhythm: Tachycardia present.  Pulmonary:     Effort: Pulmonary effort is normal. No respiratory distress.  Neurological:     Mental Status: She is alert and oriented to person, place, and time.  Psychiatric:        Attention and Perception: Attention and perception normal.        Mood and Affect: Mood normal. Affect is flat.        Speech: Speech normal.        Behavior: Behavior normal. Behavior is cooperative.        Thought Content: Thought content normal.   Review of Systems  Constitutional:  Negative for chills and fever.  Respiratory:  Negative for shortness of breath.   Cardiovascular:  Negative for chest pain and palpitations.  Gastrointestinal:  Negative for abdominal pain.  Neurological:  Negative for headaches.   Blood pressure (!) 143/69, pulse (!) 105, temperature 98.3 F (36.8 C), temperature source Oral, resp. rate 18. There  is no height or weight on file to calculate BMI.  Treatment Plan Summary: Plan Pt remains psychiatrically cleared. Disposition is pending placement.  France Ravens, MD 02/18/2022 8:16 AM

## 2022-02-19 DIAGNOSIS — F209 Schizophrenia, unspecified: Secondary | ICD-10-CM | POA: Diagnosis not present

## 2022-02-19 LAB — GLUCOSE, CAPILLARY
Glucose-Capillary: 159 mg/dL — ABNORMAL HIGH (ref 70–99)
Glucose-Capillary: 160 mg/dL — ABNORMAL HIGH (ref 70–99)
Glucose-Capillary: 177 mg/dL — ABNORMAL HIGH (ref 70–99)
Glucose-Capillary: 147 mg/dL — ABNORMAL HIGH (ref 70–99)

## 2022-02-19 MED ORDER — INSULIN ASPART 100 UNIT/ML IJ SOLN
0.0000 [IU] | Freq: Three times a day (TID) | INTRAMUSCULAR | Status: DC
  Administered 2022-02-19 (×2): 2 [IU] via SUBCUTANEOUS
  Administered 2022-02-20 – 2022-02-21 (×4): 1 [IU] via SUBCUTANEOUS
  Administered 2022-02-21: 2 [IU] via SUBCUTANEOUS
  Administered 2022-02-22: 1 [IU] via SUBCUTANEOUS
  Administered 2022-02-22: 2 [IU] via SUBCUTANEOUS
  Administered 2022-02-22: 1 [IU] via SUBCUTANEOUS
  Administered 2022-02-23: 3 [IU] via SUBCUTANEOUS
  Administered 2022-02-23 – 2022-02-24 (×3): 1 [IU] via SUBCUTANEOUS
  Administered 2022-02-24: 3 [IU] via SUBCUTANEOUS
  Administered 2022-02-25: 2 [IU] via SUBCUTANEOUS
  Administered 2022-02-26 – 2022-02-27 (×3): 1 [IU] via SUBCUTANEOUS
  Administered 2022-02-27 (×2): 2 [IU] via SUBCUTANEOUS
  Administered 2022-02-28: 1 [IU] via SUBCUTANEOUS
  Administered 2022-02-28: 2 [IU] via SUBCUTANEOUS
  Administered 2022-03-01: 1 [IU] via SUBCUTANEOUS
  Administered 2022-03-01: 3 [IU] via SUBCUTANEOUS
  Administered 2022-03-02: 2 [IU] via SUBCUTANEOUS
  Administered 2022-03-03 (×2): 1 [IU] via SUBCUTANEOUS
  Administered 2022-03-04 – 2022-03-05 (×2): 2 [IU] via SUBCUTANEOUS
  Administered 2022-03-05: 3 [IU] via SUBCUTANEOUS
  Administered 2022-03-06 – 2022-03-07 (×4): 2 [IU] via SUBCUTANEOUS
  Administered 2022-03-07: 1 [IU] via SUBCUTANEOUS
  Administered 2022-03-07: 2 [IU] via SUBCUTANEOUS
  Administered 2022-03-08: 1 [IU] via SUBCUTANEOUS
  Administered 2022-03-08 (×2): 2 [IU] via SUBCUTANEOUS
  Administered 2022-03-09: 1 [IU] via SUBCUTANEOUS
  Administered 2022-03-09 – 2022-03-10 (×2): 3 [IU] via SUBCUTANEOUS
  Administered 2022-03-10 – 2022-03-11 (×3): 1 [IU] via SUBCUTANEOUS
  Administered 2022-03-12: 3 [IU] via SUBCUTANEOUS
  Administered 2022-03-13: 4 [IU] via SUBCUTANEOUS
  Administered 2022-03-13 – 2022-03-14 (×3): 1 [IU] via SUBCUTANEOUS
  Administered 2022-03-14: 2 [IU] via SUBCUTANEOUS
  Administered 2022-03-14: 3 [IU] via SUBCUTANEOUS
  Administered 2022-03-15: 1 [IU] via SUBCUTANEOUS
  Filled 2022-02-19 (×5): qty 10

## 2022-02-19 NOTE — ED Notes (Signed)
Pt sleeping at present, no distress noted.  Monitoring for safety. 

## 2022-02-19 NOTE — ED Provider Notes (Signed)
Behavioral Health Progress Note  Date and Time: 02/19/2022 9:10 AM Name: Teresa Murillo MRN:  [id number]  Subjective:    Teresa Murillo is a 61 y.o. female with a past psychiatric history of schizophrenia, aggressive behavior, and possible intellectual disability presenting to [facility] on 11/23/21 voluntarily as a walk in via [facility] with complaints that she was locked out of the group home. Patient has been dismissed from group home due to multiple elopements. Pt had already been discharged from this facility but had been living there temporarily while DSS was looking for new placement. Pt is boarding at [facility].   AID to Capacity Evaluation (ACE) completed by Dr. Louis Meckel, Dr. Dwyane Dee on 12/11/2021.  Please see media tab for full details.    [name], PhD completed: Wechsler Adult Intelligence Scale-4, Ms. N[place]wat achieved a full-scale IQ score of 73 and a percentile rank of 4 placing her in the borderline range of intellectual functioning (12/14/2021) Please see consult note from [name], PhD on 12/14/2021.  On reassessment, pt is a&ox3, in no acute distress. She reports euthymic mood. She verbalizes understanding that social work is continuing to seek placement for her. She denies suicidal, homicidal or violent ideations, plan or intent. She denies auditory visual hallucinations or paranoia. There is no evidence of agitation, aggression, distractibility or internal preoccupation. No delusions or paranoia elicited. Pt is calm, cooperative, pleasant. Pt remains psychiatrically cleared. LCSW is working on placement. No chest pain at this time and continues to do well.   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 On the Wechsler Adult Intelligence Scale-4, Ms. Magana achieved a full-scale IQ score of 73 and a percentile rank of 4 placing her in the borderline range of intellectual functioning.    Chronic obstructive pulmonary disease (COPD) (HCC)    Glaucoma    Hyperlipidemia    Hypertension    Iron deficiency    Schizoaffective disorder (HCC)    Type 2 diabetes mellitus (HCC)     Past Surgical History:  Procedure Laterality Date   TUBAL LIGATION     Family History:  Family History  Problem Relation Age of Onset   Breast cancer Maternal Grandmother    Family Psychiatric  History: None reported Social History:  Social History   Substance and Sexual Activity  Alcohol Use Not Currently     Social History   Substance and Sexual Activity  Drug Use Not Currently    Social History   Socioeconomic History   Marital status: Divorced    Spouse name: Not on file   Number of children: Not on file   Years of education: Not on file   Highest education level: Not on file  Occupational History   Not on file  Tobacco Use   Smoking status: Former    Packs/day: 1.00    Types: Cigars, Cigarettes    Quit date: 11/09/2021    Years since quitting: 0.2   Smokeless tobacco: Current  Vaping Use   Vaping Use: Never used  Substance and Sexual Activity   Alcohol use: Not Currently   Drug use: Not Currently   Sexual activity: Not Currently    Birth control/protection: Surgical  Other Topics Concern   Not on file  Social History Narrative  Not on file   Social Determinants of Health   Financial Resource Strain: Not on file  Food Insecurity: Not on file  Transportation Needs: Not on file  Physical Activity: Not on file  Stress: Not on file  Social Connections: Not on file   SDOH:  SDOH Screenings   Depression (PHQ2-9): Low Risk  (11/23/2021)  Tobacco Use: High Risk (02/17/2022)   Additional Social History:                         Sleep:  Good  Appetite:  Good  Current Medications:  Current Facility-Administered Medications  Medication Dose Route Frequency Provider Last Rate Last Admin   acetaminophen (TYLENOL) tablet 650 mg  650 mg Oral Q6H PRN [name], NP   650 mg at 02/18/22 2113   albuterol (VENTOLIN HFA) 108 (90 Base) MCG/ACT inhaler 2 puff  2 puff Inhalation Q6H PRN [name], NP       alum & mag hydroxide-simeth (MAALOX/MYLANTA) 200-200-20 MG/5ML suspension 30 mL  30 mL Oral Q4H PRN [name], NP       apixaban Arne Cleveland) tablet 5 mg  5 mg Oral BID [name], NP   5 mg at 02/19/22 4081   ARIPiprazole (ABILIFY) tablet 10 mg  10 mg Oral Daily [name], NP   10 mg at 02/19/22 4481   cloZAPine (CLOZARIL) tablet 75 mg  75 mg Oral BID France Ravens, MD   75 mg at 02/19/22 8563   colchicine tablet 0.6 mg  0.6 mg Oral QHS [name], NP   0.6 mg at 02/18/22 2112   diltiazem (CARDIZEM CD) 24 hr capsule 240 mg  240 mg Oral Daily [name], NP   240 mg at 02/19/22 0903   divalproex (DEPAKOTE ER) 24 hr tablet 500 mg  500 mg Oral BID [name], NP   500 mg at 02/19/22 1497   haloperidol (HALDOL) tablet 10 mg  10 mg Oral TID PRN [name], NP       insulin glargine-yfgn [facility]) injection 12 Units  12 Units Subcutaneous Daily [name], NP   12 Units at 02/19/22 0905   latanoprost (XALATAN) 0.005 % ophthalmic solution 1 drop  1 drop Both Eyes QHS [name], NP   1 drop at 02/18/22 2114   magnesium hydroxide (MILK OF MAGNESIA) suspension 30 mL  30 mL Oral Daily PRN [name], NP       metFORMIN (GLUCOPHAGE) tablet 500 mg  500 mg Oral BID WC [name], NP   500 mg at 02/19/22 0853   mometasone-formoterol (DULERA) 200-5 MCG/ACT inhaler 2 puff  2 puff Inhalation BID [name], NP   2 puff at 02/19/22 0853   nicotine (NICODERM CQ - dosed in mg/24 hr) patch 7 mg  7 mg Transdermal Daily [name], NP   7 mg at 02/19/22 0854   valbenazine (INGREZZA) capsule 40 mg  40 mg Oral q AM  [name], NP   40 mg at 02/19/22 0636   Vitamin D (Ergocalciferol) (DRISDOL) 1.25 MG (50000 UNIT) capsule 50,000 Units  50,000 Units Oral Q Leonie Green, NP   50,000 Units at 02/18/22 0263   Current Outpatient Medications  Medication Sig Dispense Refill   Accu-Chek Softclix Lancets lancets Use as directed up to four times daily 100 each 0   apixaban (ELIQUIS) 5 MG TABS tablet Take 1 tablet (5 mg total) by mouth 2 (two) times daily. 60 tablet 0  ARIPiprazole (ABILIFY) 10 MG tablet Take 1 tablet (10 mg total) by mouth daily. 30 tablet 0   Blood Glucose Monitoring Suppl (ACCU-CHEK GUIDE) w/Device KIT Use as directed up to four times daily 1 kit 0   budesonide-formoterol (SYMBICORT) 160-4.5 MCG/ACT inhaler Inhale 2 puffs into the lungs in the morning and at bedtime.     Cholecalciferol (VITAMIN D3) 1.25 MG (50000 UT) CAPS Take 50,000 Units by mouth every Thursday.     clozapine (CLOZARIL) 50 MG tablet Take 1 tablet (50 mg total) by mouth daily. 30 tablet 0   colchicine 0.6 MG tablet Take 1 tablet (0.6 mg total) by mouth at bedtime. 30 tablet 0   diltiazem (CARDIZEM CD) 240 MG 24 hr capsule Take 1 capsule (240 mg total) by mouth daily. (Patient not taking: Reported on 11/24/2021) 30 capsule 0   divalproex (DEPAKOTE ER) 500 MG 24 hr tablet Take 1 tablet (500 mg total) by mouth 2 (two) times daily. 60 tablet 0   glucose blood test strip Use as directed up to four times daily 50 each 0   haloperidol (HALDOL) 10 MG tablet Take 1 tablet (10 mg total) by mouth 3 (three) times daily as needed for agitation (and psychotic symptoms).     INGREZZA 40 MG capsule Take 1 capsule (40 mg total) by mouth in the morning. 30 capsule 0   insulin aspart (NOVOLOG) 100 UNIT/ML FlexPen Before each meal 3 times a day, 140-199 - 2 units, 200-250 - 4 units, 251-299 - 6 units,  300-349 - 8 units,  350 or above 10 units. Insulin PEN if approved, provide syringes and needles if needed.Please switch to any approved short  acting Insulin if needed. 15 mL 0   insulin glargine (LANTUS) 100 UNIT/ML Solostar Pen Inject 12 Units into the skin daily. 15 mL 0   Insulin Pen Needle 32G X 4 MM MISC Use 4 times a day with insulin, 1 month supply. 100 each 0   latanoprost (XALATAN) 0.005 % ophthalmic solution Place 1 drop into both eyes at bedtime.     metFORMIN (GLUCOPHAGE) 500 MG tablet Take 500 mg by mouth 2 (two) times daily with a meal.     nicotine (NICODERM CQ - DOSED IN MG/24 HR) 7 mg/24hr patch Place 1 patch (7 mg total) onto the skin daily. 28 patch 0   PROAIR HFA 108 (90 Base) MCG/ACT inhaler Inhale 2 puffs into the lungs every 6 (six) hours as needed for wheezing or shortness of breath.      Labs  Lab Results:  Admission on 02/18/2022  Component Date Value Ref Range Status   Glucose-Capillary 02/18/2022 254 (H)  70 - 99 mg/dL Final   Glucose reference range applies only to samples taken after fasting for at least 8 hours.   Glucose-Capillary 02/18/2022 112 (H)  70 - 99 mg/dL Final   Glucose reference range applies only to samples taken after fasting for at least 8 hours.   Glucose-Capillary 02/19/2022 159 (H)  70 - 99 mg/dL Final   Glucose reference range applies only to samples taken after fasting for at least 8 hours.  Admission on 02/17/2022, Discharged on 02/18/2022  Component Date Value Ref Range Status   Sodium 02/17/2022 136  135 - 145 mmol/L Final   Potassium 02/17/2022 3.9  3.5 - 5.1 mmol/L Final   Chloride 02/17/2022 101  98 - 111 mmol/L Final   CO2 02/17/2022 25  22 - 32 mmol/L Final   Glucose, Bld 02/17/2022 113 (  H)  70 - 99 mg/dL Final   Glucose reference range applies only to samples taken after fasting for at least 8 hours.   BUN 02/17/2022 9  6 - 20 mg/dL Final   Creatinine, Ser 02/17/2022 0.50  0.44 - 1.00 mg/dL Final   Calcium 02/17/2022 8.9  8.9 - 10.3 mg/dL Final   GFR, Estimated 02/17/2022 >60  >60 mL/min Final   Comment: (NOTE) Calculated using the CKD-EPI Creatinine Equation  (2021)    Anion gap 02/17/2022 10  5 - 15 Final   Performed at [facility], [place] [address] [place], [place] [address]   WBC 02/17/2022 6.5  4.0 - 10.5 K/uL Final   RBC 02/17/2022 4.24  3.87 - 5.11 MIL/uL Final   Hemoglobin 02/17/2022 10.2 (L)  12.0 - 15.0 g/dL Final   HCT 02/17/2022 33.2 (L)  36.0 - 46.0 % Final   MCV 02/17/2022 78.3 (L)  80.0 - 100.0 fL Final   MCH 02/17/2022 24.1 (L)  26.0 - 34.0 pg Final   MCHC 02/17/2022 30.7  30.0 - 36.0 g/dL Final   RDW 02/17/2022 17.2 (H)  11.5 - 15.5 % Final   Platelets 02/17/2022 390  150 - 400 K/uL Final   nRBC 02/17/2022 0.0  0.0 - 0.2 % Final   Performed at [facility], [place] [address], [place], [place] [address]   Troponin I (High Sensitivity) 02/17/2022 4  <18 ng/L Final   Comment: (NOTE) Elevated high sensitivity troponin I (hsTnI) values and significant  changes across serial measurements may suggest ACS but many other  chronic and acute conditions are known to elevate hsTnI results.  Refer to the "Links" section for chest pain algorithms and additional  guidance. Performed at [facility], [place] [address], [place], [place] [address]    Valproic Acid Lvl 02/17/2022 17 (L)  50.0 - 100.0 ug/mL Final   Performed at [facility] [address] [place], [place] [address]   B Natriuretic Peptide 02/17/2022 30.9  0.0 - 100.0 pg/mL Final   Performed at [place] [address], [place], [place] [address]   Troponin I (High Sensitivity) 02/18/2022 4  <18 ng/L Final   Comment: (NOTE) Elevated high sensitivity troponin I (hsTnI) values and significant  changes across serial measurements may suggest ACS but many other  chronic and acute conditions are known to elevate hsTnI results.  Refer to the "Links" section for chest pain algorithms and additional  guidance. Performed at [facility], [place] [address] [place], [place] [address]    D-Dimer, Quant 02/18/2022 0.29  0.00 - 0.50 ug/mL-FEU Final    Comment: (NOTE) At the manufacturer cut-off value of 0.5 g/mL FEU, this assay has a negative predictive value of 95-100%.This assay is intended for use in conjunction with a clinical pretest probability (PTP) assessment model to exclude pulmonary embolism (PE) and deep venous thrombosis (DVT) in outpatients suspected of PE or DVT. Results should be correlated with clinical presentation. Performed at [facility], [place] [address], [place], [place] [address]   Admission on 02/09/2022, Discharged on 02/17/2022  Component Date Value Ref Range Status   Glucose-Capillary 02/09/2022 118 (H)  70 - 99 mg/dL Final   Glucose reference range applies only to samples taken after fasting for at least 8 hours.   Glucose-Capillary 02/10/2022 148 (H)  70 - 99 mg/dL Final   Glucose reference range applies only to samples taken after fasting for  at least 8 hours.   Glucose-Capillary 02/10/2022 174 (H)  70 - 99 mg/dL Final   Glucose reference range applies only to samples taken after fasting for at least 8 hours.   Glucose-Capillary 02/10/2022 128 (H)  70 - 99 mg/dL Final   Glucose reference range applies only to samples taken after fasting for at least 8 hours.   Glucose-Capillary 02/10/2022 182 (H)  70 - 99 mg/dL Final   Glucose reference range applies only to samples taken after fasting for at least 8 hours.   Glucose-Capillary 02/11/2022 158 (H)  70 - 99 mg/dL Final   Glucose reference range applies only to samples taken after fasting for at least 8 hours.   Glucose-Capillary 02/11/2022 159 (H)  70 - 99 mg/dL Final   Glucose reference range applies only to samples taken after fasting for at least 8 hours.   Glucose-Capillary 02/11/2022 153 (H)  70 - 99 mg/dL Final   Glucose reference range applies only to samples taken after fasting for at least 8 hours.   Glucose-Capillary 02/11/2022 159 (H)  70 - 99 mg/dL Final   Glucose reference range applies only to samples taken after fasting for at least 8  hours.   Glucose-Capillary 02/11/2022 153 (H)  70 - 99 mg/dL Final   Glucose reference range applies only to samples taken after fasting for at least 8 hours.   Glucose-Capillary 02/11/2022 109 (H)  70 - 99 mg/dL Final   Glucose reference range applies only to samples taken after fasting for at least 8 hours.   Glucose-Capillary 02/11/2022 213 (H)  70 - 99 mg/dL Final   Glucose reference range applies only to samples taken after fasting for at least 8 hours.   Glucose-Capillary 02/11/2022 229 (H)  70 - 99 mg/dL Final   Glucose reference range applies only to samples taken after fasting for at least 8 hours.   Glucose-Capillary 02/12/2022 128 (H)  70 - 99 mg/dL Final   Glucose reference range applies only to samples taken after fasting for at least 8 hours.   Glucose-Capillary 02/12/2022 149 (H)  70 - 99 mg/dL Final   Glucose reference range applies only to samples taken after fasting for at least 8 hours.   Color, Urine 02/12/2022 YELLOW  YELLOW Final   APPearance 02/12/2022 CLEAR  CLEAR Final   Specific Gravity, Urine 02/12/2022 1.015  1.005 - 1.030 Final   pH 02/12/2022 5.0  5.0 - 8.0 Final   Glucose, UA 02/12/2022 NEGATIVE  NEGATIVE mg/dL Final   Hgb urine dipstick 02/12/2022 NEGATIVE  NEGATIVE Final   Bilirubin Urine 02/12/2022 NEGATIVE  NEGATIVE Final   Ketones, ur 02/12/2022 NEGATIVE  NEGATIVE mg/dL Final   Protein, ur 02/12/2022 NEGATIVE  NEGATIVE mg/dL Final   Nitrite 02/12/2022 NEGATIVE  NEGATIVE Final   Leukocytes,Ua 02/12/2022 NEGATIVE  NEGATIVE Final   Performed at [facility], [place] [address] [place], [place] [address]   Specimen Description 02/12/2022 URINE, CLEAN CATCH   Final   Special Requests 02/12/2022    Final                   Value:NONE Performed at [facility], [place] [address], [place], [place] [address]    Culture 02/12/2022 10,000 COLONIES/mL PROTEUS MIRABILIS (A)   Final   Report Status 02/12/2022 02/15/2022 FINAL   Final   Organism ID, Bacteria  02/12/2022 PROTEUS MIRABILIS (A)   Final   Glucose-Capillary 02/12/2022 128 (H)  70 - 99 mg/dL  Final   Glucose reference range applies only to samples taken after fasting for at least 8 hours.   Glucose-Capillary 02/12/2022 196 (H)  70 - 99 mg/dL Final   Glucose reference range applies only to samples taken after fasting for at least 8 hours.   Glucose-Capillary 02/13/2022 168 (H)  70 - 99 mg/dL Final   Glucose reference range applies only to samples taken after fasting for at least 8 hours.   Glucose-Capillary 02/13/2022 153 (H)  70 - 99 mg/dL Final   Glucose reference range applies only to samples taken after fasting for at least 8 hours.   Glucose-Capillary 02/13/2022 134 (H)  70 - 99 mg/dL Final   Glucose reference range applies only to samples taken after fasting for at least 8 hours.   Glucose-Capillary 02/13/2022 178 (H)  70 - 99 mg/dL Final   Glucose reference range applies only to samples taken after fasting for at least 8 hours.   Glucose-Capillary 02/14/2022 156 (H)  70 - 99 mg/dL Final   Glucose reference range applies only to samples taken after fasting for at least 8 hours.   Glucose-Capillary 02/14/2022 169 (H)  70 - 99 mg/dL Final   Glucose reference range applies only to samples taken after fasting for at least 8 hours.   Glucose-Capillary 02/14/2022 156 (H)  70 - 99 mg/dL Final   Glucose reference range applies only to samples taken after fasting for at least 8 hours.   Glucose-Capillary 02/14/2022 148 (H)  70 - 99 mg/dL Final   Glucose reference range applies only to samples taken after fasting for at least 8 hours.   Glucose-Capillary 02/15/2022 159 (H)  70 - 99 mg/dL Final   Glucose reference range applies only to samples taken after fasting for at least 8 hours.   Glucose-Capillary 02/15/2022 125 (H)  70 - 99 mg/dL Final   Glucose reference range applies only to samples taken after fasting for at least 8 hours.   Glucose-Capillary 02/15/2022 142 (H)  70 - 99 mg/dL Final    Glucose reference range applies only to samples taken after fasting for at least 8 hours.   Glucose-Capillary 02/15/2022 128 (H)  70 - 99 mg/dL Final   Glucose reference range applies only to samples taken after fasting for at least 8 hours.   Glucose-Capillary 02/16/2022 137 (H)  70 - 99 mg/dL Final   Glucose reference range applies only to samples taken after fasting for at least 8 hours.   WBC 02/16/2022 6.0  4.0 - 10.5 K/uL Final   RBC 02/16/2022 4.48  3.87 - 5.11 MIL/uL Final   Hemoglobin 02/16/2022 10.7 (L)  12.0 - 15.0 g/dL Final   HCT 02/16/2022 35.4 (L)  36.0 - 46.0 % Final   MCV 02/16/2022 79.0 (L)  80.0 - 100.0 fL Final   MCH 02/16/2022 23.9 (L)  26.0 - 34.0 pg Final   MCHC 02/16/2022 30.2  30.0 - 36.0 g/dL Final   RDW 02/16/2022 17.0 (H)  11.5 - 15.5 % Final   Platelets 02/16/2022 387  150 - 400 K/uL Final   nRBC 02/16/2022 0.0  0.0 - 0.2 % Final   Neutrophils Relative % 02/16/2022 54  % Final   Neutro Abs 02/16/2022 3.2  1.7 - 7.7 K/uL Final   Lymphocytes Relative 02/16/2022 35  % Final   Lymphs Abs 02/16/2022 2.1  0.7 - 4.0 K/uL Final   Monocytes Relative 02/16/2022 6  % Final   Monocytes Absolute 02/16/2022 0.4  0.1 -  1.0 K/uL Final   Eosinophils Relative 02/16/2022 2  % Final   Eosinophils Absolute 02/16/2022 0.1  0.0 - 0.5 K/uL Final   Basophils Relative 02/16/2022 2  % Final   Basophils Absolute 02/16/2022 0.1  0.0 - 0.1 K/uL Final   Immature Granulocytes 02/16/2022 1  % Final   Abs Immature Granulocytes 02/16/2022 0.04  0.00 - 0.07 K/uL Final   Performed at [facility], [place] [address], [place], [place] [address]   Glucose-Capillary 02/16/2022 131 (H)  70 - 99 mg/dL Final   Glucose reference range applies only to samples taken after fasting for at least 8 hours.   Glucose-Capillary 02/16/2022 123 (H)  70 - 99 mg/dL Final   Glucose reference range applies only to samples taken after fasting for at least 8 hours.   Glucose-Capillary 02/16/2022 195 (H)  70 -  99 mg/dL Final   Glucose reference range applies only to samples taken after fasting for at least 8 hours.   Glucose-Capillary 02/17/2022 148 (H)  70 - 99 mg/dL Final   Glucose reference range applies only to samples taken after fasting for at least 8 hours.  Admission on 02/08/2022, Discharged on 02/09/2022  Component Date Value Ref Range Status   B Natriuretic Peptide 02/08/2022 40.2  0.0 - 100.0 pg/mL Final   Performed at [facility], [address], [place], [place] [address]   Troponin I (High Sensitivity) 02/08/2022 4  <18 ng/L Final   Comment: (NOTE) Elevated high sensitivity troponin I (hsTnI) values and significant  changes across serial measurements may suggest ACS but many other  chronic and acute conditions are known to elevate hsTnI results.  Refer to the "Links" section for chest pain algorithms and additional  guidance. Performed at [facility], [place] [address] [place], [place] [address]    WBC 02/08/2022 8.5  4.0 - 10.5 K/uL Final   RBC 02/08/2022 4.30  3.87 - 5.11 MIL/uL Final   Hemoglobin 02/08/2022 10.7 (L)  12.0 - 15.0 g/dL Final   HCT 02/08/2022 33.1 (L)  36.0 - 46.0 % Final   MCV 02/08/2022 77.0 (L)  80.0 - 100.0 fL Final   MCH 02/08/2022 24.9 (L)  26.0 - 34.0 pg Final   MCHC 02/08/2022 32.3  30.0 - 36.0 g/dL Final   RDW 02/08/2022 16.5 (H)  11.5 - 15.5 % Final   Platelets 02/08/2022 361  150 - 400 K/uL Final   nRBC 02/08/2022 0.0  0.0 - 0.2 % Final   Performed at [place] [address], [place], [place] [address]   Sodium 02/08/2022 131 (L)  135 - 145 mmol/L Final   Potassium 02/08/2022 4.4  3.5 - 5.1 mmol/L Final   Chloride 02/08/2022 96 (L)  98 - 111 mmol/L Final   CO2 02/08/2022 25  22 - 32 mmol/L Final   Glucose, Bld 02/08/2022 126 (H)  70 - 99 mg/dL Final   Glucose reference range applies only to samples taken after fasting for at least 8 hours.   BUN 02/08/2022 11  6 - 20 mg/dL Final   Creatinine, Ser 02/08/2022 0.52  0.44 -  1.00 mg/dL Final   Calcium 02/08/2022 9.4  8.9 - 10.3 mg/dL Final   Total Protein 02/08/2022 7.5  6.5 - 8.1 g/dL Final   Albumin 02/08/2022 3.6  3.5 - 5.0 g/dL Final   AST 02/08/2022 22  15 - 41 U/L Final   ALT 02/08/2022 20  0 - 44  U/L Final   Alkaline Phosphatase 02/08/2022 67  38 - 126 U/L Final   Total Bilirubin 02/08/2022 0.1 (L)  0.3 - 1.2 mg/dL Final   GFR, Estimated 02/08/2022 >60  >60 mL/min Final   Comment: (NOTE) Calculated using the CKD-EPI Creatinine Equation (2021)    Anion gap 02/08/2022 10  5 - 15 Final   Performed at [facility] [address] [place], [place] [address]   Troponin I (High Sensitivity) 02/08/2022 5  <18 ng/L Final   Comment: (NOTE) Elevated high sensitivity troponin I (hsTnI) values and significant  changes across serial measurements may suggest ACS but many other  chronic and acute conditions are known to elevate hsTnI results.  Refer to the "Links" section for chest pain algorithms and additional  guidance. Performed at [facility], [place] [address], [place], [place] [address]    WBC 02/09/2022 7.3  4.0 - 10.5 K/uL Final   RBC 02/09/2022 4.70  3.87 - 5.11 MIL/uL Final   Hemoglobin 02/09/2022 11.4 (L)  12.0 - 15.0 g/dL Final   HCT 02/09/2022 36.6  36.0 - 46.0 % Final   MCV 02/09/2022 77.9 (L)  80.0 - 100.0 fL Final   MCH 02/09/2022 24.3 (L)  26.0 - 34.0 pg Final   MCHC 02/09/2022 31.1  30.0 - 36.0 g/dL Final   RDW 02/09/2022 16.6 (H)  11.5 - 15.5 % Final   Platelets 02/09/2022 403 (H)  150 - 400 K/uL Final   nRBC 02/09/2022 0.0  0.0 - 0.2 % Final   Performed at [facility] [address], [place], [place] [address]   Glucose-Capillary 02/08/2022 117 (H)  70 - 99 mg/dL Final   Glucose reference range applies only to samples taken after fasting for at least 8 hours.   Sodium 02/09/2022 138  135 - 145 mmol/L Final   Potassium 02/09/2022 3.7  3.5 - 5.1 mmol/L Final   Chloride 02/09/2022 103  98 - 111 mmol/L Final   CO2  02/09/2022 24  22 - 32 mmol/L Final   Glucose, Bld 02/09/2022 134 (H)  70 - 99 mg/dL Final   Glucose reference range applies only to samples taken after fasting for at least 8 hours.   BUN 02/09/2022 9  6 - 20 mg/dL Final   Creatinine, Ser 02/09/2022 0.44  0.44 - 1.00 mg/dL Final   Calcium 02/09/2022 8.3 (L)  8.9 - 10.3 mg/dL Final   GFR, Estimated 02/09/2022 >60  >60 mL/min Final   Comment: (NOTE) Calculated using the CKD-EPI Creatinine Equation (2021)    Anion gap 02/09/2022 11  5 - 15 Final   Performed at [facility], [place] [address] [place], [place] [address]   Iron 02/09/2022 20 (L)  28 - 170 ug/dL Final   TIBC 02/09/2022 455 (H)  250 - 450 ug/dL Final   Saturation Ratios 02/09/2022 4 (L)  10.4 - 31.8 % Final   UIBC 02/09/2022 435  ug/dL Final   Performed at [facility], [place] [address], [place], [place] [address]   Ferritin 02/09/2022 2 (L)  11 - 307 ng/mL Final   Performed at [facility] [address], [place],  [address]   Weight 02/09/2022 2,846.58  oz Final   Height 02/09/2022 62  in Final   BP 02/09/2022 143/80  mmHg Final   WBC 02/09/2022 8.1  4.0 - 10.5 K/uL Final   RBC 02/09/2022 4.38  3.87 - 5.11 MIL/uL Final  Hemoglobin 02/09/2022 10.9 (L)  12.0 - 15.0 g/dL Final   HCT 02/09/2022 33.3 (L)  36.0 - 46.0 % Final   MCV 02/09/2022 76.0 (L)  80.0 - 100.0 fL Final   MCH 02/09/2022 24.9 (L)  26.0 - 34.0 pg Final   MCHC 02/09/2022 32.7  30.0 - 36.0 g/dL Final   RDW 02/09/2022 16.5 (H)  11.5 - 15.5 % Final   Platelets 02/09/2022 397  150 - 400 K/uL Final   nRBC 02/09/2022 0.0  0.0 - 0.2 % Final   Neutrophils Relative % 02/09/2022 66  % Final   Neutro Abs 02/09/2022 5.4  1.7 - 7.7 K/uL Final   Lymphocytes Relative 02/09/2022 24  % Final   Lymphs Abs 02/09/2022 1.9  0.7 - 4.0 K/uL Final   Monocytes Relative 02/09/2022 8  % Final   Monocytes Absolute 02/09/2022 0.6  0.1 - 1.0 K/uL Final   Eosinophils Relative 02/09/2022 1  % Final   Eosinophils  Absolute 02/09/2022 0.1  0.0 - 0.5 K/uL Final   Basophils Relative 02/09/2022 1  % Final   Basophils Absolute 02/09/2022 0.1  0.0 - 0.1 K/uL Final   Immature Granulocytes 02/09/2022 0  % Final   Abs Immature Granulocytes 02/09/2022 0.03  0.00 - 0.07 K/uL Final   Performed at [facility], [place] [address] [place], [place] [address]   Glucose-Capillary 02/09/2022 238 (H)  70 - 99 mg/dL Final   Glucose reference range applies only to samples taken after fasting for at least 8 hours.   Glucose-Capillary 02/09/2022 91  70 - 99 mg/dL Final   Glucose reference range applies only to samples taken after fasting for at least 8 hours.   CRP 02/09/2022 <0.5  <1.0 mg/dL Final   Performed at [facility] [address], [place], [place] [address]   Sed Rate 02/09/2022 15  0 - 22 mm/hr Final   Performed at [facility] [address] [place], [place] [address]   Glucose-Capillary 02/09/2022 137 (H)  70 - 99 mg/dL Final   Glucose reference range applies only to samples taken after fasting for at least 8 hours.  Admission on 01/24/2022, Discharged on 02/08/2022  Component Date Value Ref Range Status   Glucose-Capillary 01/24/2022 143 (H)  70 - 99 mg/dL Final   Glucose reference range applies only to samples taken after fasting for at least 8 hours.   Glucose-Capillary 01/24/2022 120 (H)  70 - 99 mg/dL Final   Glucose reference range applies only to samples taken after fasting for at least 8 hours.   Glucose-Capillary 01/25/2022 122 (H)  70 - 99 mg/dL Final   Glucose reference range applies only to samples taken after fasting for at least 8 hours.   Glucose-Capillary 01/25/2022 190 (H)  70 - 99 mg/dL Final   Glucose reference range applies only to samples taken after fasting for at least 8 hours.   Glucose-Capillary 01/25/2022 163 (H)  70 - 99 mg/dL Final   Glucose reference range applies only to samples taken after fasting for at least 8 hours.   WBC 01/26/2022 6.5  4.0 - 10.5 K/uL  Final   RBC 01/26/2022 4.53  3.87 - 5.11 MIL/uL Final   Hemoglobin 01/26/2022 11.3 (L)  12.0 - 15.0 g/dL Final   HCT 01/26/2022 36.4  36.0 - 46.0 % Final   MCV 01/26/2022 80.4  80.0 - 100.0 fL Final   MCH 01/26/2022 24.9 (L)  26.0 - 34.0 pg Final  MCHC 01/26/2022 31.0  30.0 - 36.0 g/dL Final   RDW 01/26/2022 17.3 (H)  11.5 - 15.5 % Final   Platelets 01/26/2022 327  150 - 400 K/uL Final   nRBC 01/26/2022 0.0  0.0 - 0.2 % Final   Neutrophils Relative % 01/26/2022 60  % Final   Neutro Abs 01/26/2022 3.9  1.7 - 7.7 K/uL Final   Lymphocytes Relative 01/26/2022 31  % Final   Lymphs Abs 01/26/2022 2.0  0.7 - 4.0 K/uL Final   Monocytes Relative 01/26/2022 7  % Final   Monocytes Absolute 01/26/2022 0.5  0.1 - 1.0 K/uL Final   Eosinophils Relative 01/26/2022 1  % Final   Eosinophils Absolute 01/26/2022 0.1  0.0 - 0.5 K/uL Final   Basophils Relative 01/26/2022 1  % Final   Basophils Absolute 01/26/2022 0.1  0.0 - 0.1 K/uL Final   Immature Granulocytes 01/26/2022 0  % Final   Abs Immature Granulocytes 01/26/2022 0.02  0.00 - 0.07 K/uL Final   Performed at [facility], [place] [address], [place], [place] [address]   Glucose-Capillary 01/26/2022 149 (H)  70 - 99 mg/dL Final   Glucose reference range applies only to samples taken after fasting for at least 8 hours.   Glucose-Capillary 01/26/2022 130 (H)  70 - 99 mg/dL Final   Glucose reference range applies only to samples taken after fasting for at least 8 hours.   Glucose-Capillary 01/26/2022 151 (H)  70 - 99 mg/dL Final   Glucose reference range applies only to samples taken after fasting for at least 8 hours.   Glucose-Capillary 01/26/2022 170 (H)  70 - 99 mg/dL Final   Glucose reference range applies only to samples taken after fasting for at least 8 hours.   Glucose-Capillary 01/27/2022 129 (H)  70 - 99 mg/dL Final   Glucose reference range applies only to samples taken after fasting for at least 8 hours.   Glucose-Capillary 01/27/2022  101 (H)  70 - 99 mg/dL Final   Glucose reference range applies only to samples taken after fasting for at least 8 hours.   Glucose-Capillary 01/27/2022 220 (H)  70 - 99 mg/dL Final   Glucose reference range applies only to samples taken after fasting for at least 8 hours.   Glucose-Capillary 01/27/2022 154 (H)  70 - 99 mg/dL Final   Glucose reference range applies only to samples taken after fasting for at least 8 hours.   Glucose-Capillary 01/27/2022 163 (H)  70 - 99 mg/dL Final   Glucose reference range applies only to samples taken after fasting for at least 8 hours.   Glucose-Capillary 01/28/2022 127 (H)  70 - 99 mg/dL Final   Glucose reference range applies only to samples taken after fasting for at least 8 hours.   Glucose-Capillary 01/28/2022 110 (H)  70 - 99 mg/dL Final   Glucose reference range applies only to samples taken after fasting for at least 8 hours.   Glucose-Capillary 01/28/2022 167 (H)  70 - 99 mg/dL Final   Glucose reference range applies only to samples taken after fasting for at least 8 hours.   Glucose-Capillary 01/29/2022 145 (H)  70 - 99 mg/dL Final   Glucose reference range applies only to samples taken after fasting for at least 8 hours.   Glucose-Capillary 01/29/2022 203 (H)  70 - 99 mg/dL Final   Glucose reference range applies only to samples taken after fasting for at least 8 hours.   Glucose-Capillary 01/29/2022 153 (H)  70 - 99 mg/dL Final   Glucose reference range applies only to samples taken after fasting for at least 8 hours.   Glucose-Capillary 01/29/2022 166 (H)  70 - 99 mg/dL Final   Glucose reference range applies only to samples taken after fasting for at least 8 hours.   Glucose-Capillary 01/30/2022 142 (H)  70 - 99 mg/dL Final   Glucose reference range applies only to samples taken after fasting for at least 8 hours.   Glucose-Capillary 01/30/2022 138 (H)  70 - 99 mg/dL Final   Glucose reference range applies only to samples taken after fasting  for at least 8 hours.   Glucose-Capillary 01/30/2022 185 (H)  70 - 99 mg/dL Final   Glucose reference range applies only to samples taken after fasting for at least 8 hours.   Glucose-Capillary 01/30/2022 220 (H)  70 - 99 mg/dL Final   Glucose reference range applies only to samples taken after fasting for at least 8 hours.   Glucose-Capillary 01/31/2022 147 (H)  70 - 99 mg/dL Final   Glucose reference range applies only to samples taken after fasting for at least 8 hours.   Glucose-Capillary 01/31/2022 131 (H)  70 - 99 mg/dL Final   Glucose reference range applies only to samples taken after fasting for at least 8 hours.   Glucose-Capillary 01/31/2022 169 (H)  70 - 99 mg/dL Final   Glucose reference range applies only to samples taken after fasting for at least 8 hours.   Glucose-Capillary 01/31/2022 144 (H)  70 - 99 mg/dL Final   Glucose reference range applies only to samples taken after fasting for at least 8 hours.   Comment 1 01/31/2022 RN AWARE   Final   Glucose-Capillary 02/01/2022 117 (H)  70 - 99 mg/dL Final   Glucose reference range applies only to samples taken after fasting for at least 8 hours.   Glucose-Capillary 02/01/2022 180 (H)  70 - 99 mg/dL Final   Glucose reference range applies only to samples taken after fasting for at least 8 hours.   Glucose-Capillary 02/01/2022 209 (H)  70 - 99 mg/dL Final   Glucose reference range applies only to samples taken after fasting for at least 8 hours.   Glucose-Capillary 02/02/2022 131 (H)  70 - 99 mg/dL Final   Glucose reference range applies only to samples taken after fasting for at least 8 hours.   WBC 02/02/2022 7.5  4.0 - 10.5 K/uL Final   RBC 02/02/2022 4.52  3.87 - 5.11 MIL/uL Final   Hemoglobin 02/02/2022 11.3 (L)  12.0 - 15.0 g/dL Final   HCT 02/02/2022 35.0 (L)  36.0 - 46.0 % Final   MCV 02/02/2022 77.4 (L)  80.0 - 100.0 fL Final   MCH 02/02/2022 25.0 (L)  26.0 - 34.0 pg Final   MCHC 02/02/2022 32.3  30.0 - 36.0 g/dL Final    RDW 02/02/2022 16.7 (H)  11.5 - 15.5 % Final   Platelets 02/02/2022 345  150 - 400 K/uL Final   nRBC 02/02/2022 0.0  0.0 - 0.2 % Final   Neutrophils Relative % 02/02/2022 63  % Final   Neutro Abs 02/02/2022 4.7  1.7 - 7.7 K/uL Final   Lymphocytes Relative 02/02/2022 28  % Final   Lymphs Abs 02/02/2022 2.1  0.7 - 4.0 K/uL Final   Monocytes Relative 02/02/2022 7  % Final   Monocytes Absolute 02/02/2022 0.5  0.1 - 1.0 K/uL Final   Eosinophils Relative 02/02/2022 1  % Final  Eosinophils Absolute 02/02/2022 0.1  0.0 - 0.5 K/uL Final   Basophils Relative 02/02/2022 1  % Final   Basophils Absolute 02/02/2022 0.1  0.0 - 0.1 K/uL Final   Immature Granulocytes 02/02/2022 0  % Final   Abs Immature Granulocytes 02/02/2022 0.03  0.00 - 0.07 K/uL Final   Performed at [facility], [place] [address] [place], [place] [address]   Glucose-Capillary 02/02/2022 95  70 - 99 mg/dL Final   Glucose reference range applies only to samples taken after fasting for at least 8 hours.   Glucose-Capillary 02/02/2022 120 (H)  70 - 99 mg/dL Final   Glucose reference range applies only to samples taken after fasting for at least 8 hours.   Glucose-Capillary 02/02/2022 191 (H)  70 - 99 mg/dL Final   Glucose reference range applies only to samples taken after fasting for at least 8 hours.   Glucose-Capillary 02/03/2022 148 (H)  70 - 99 mg/dL Final   Glucose reference range applies only to samples taken after fasting for at least 8 hours.   Glucose-Capillary 02/03/2022 122 (H)  70 - 99 mg/dL Final   Glucose reference range applies only to samples taken after fasting for at least 8 hours.   Glucose-Capillary 02/03/2022 233 (H)  70 - 99 mg/dL Final   Glucose reference range applies only to samples taken after fasting for at least 8 hours.   Glucose-Capillary 02/04/2022 126 (H)  70 - 99 mg/dL Final   Glucose reference range applies only to samples taken after fasting for at least 8 hours.   Glucose-Capillary  02/04/2022 117 (H)  70 - 99 mg/dL Final   Glucose reference range applies only to samples taken after fasting for at least 8 hours.   Glucose-Capillary 02/04/2022 135 (H)  70 - 99 mg/dL Final   Glucose reference range applies only to samples taken after fasting for at least 8 hours.   Glucose-Capillary 02/04/2022 197 (H)  70 - 99 mg/dL Final   Glucose reference range applies only to samples taken after fasting for at least 8 hours.   Glucose-Capillary 02/05/2022 170 (H)  70 - 99 mg/dL Final   Glucose reference range applies only to samples taken after fasting for at least 8 hours.   Glucose-Capillary 02/05/2022 107 (H)  70 - 99 mg/dL Final   Glucose reference range applies only to samples taken after fasting for at least 8 hours.   Glucose-Capillary 02/05/2022 184 (H)  70 - 99 mg/dL Final   Glucose reference range applies only to samples taken after fasting for at least 8 hours.   Glucose-Capillary 02/05/2022 256 (H)  70 - 99 mg/dL Final   Glucose reference range applies only to samples taken after fasting for at least 8 hours.   Glucose-Capillary 02/06/2022 144 (H)  70 - 99 mg/dL Final   Glucose reference range applies only to samples taken after fasting for at least 8 hours.   Glucose-Capillary 02/06/2022 190 (H)  70 - 99 mg/dL Final   Glucose reference range applies only to samples taken after fasting for at least 8 hours.   Glucose-Capillary 02/06/2022 92  70 - 99 mg/dL Final   Glucose reference range applies only to samples taken after fasting for at least 8 hours.   Trichomonas 02/06/2022 Negative   Final   Bacterial Vaginitis-Urine 02/06/2022 Negative   Final   Molecular Comment 02/06/2022 For tests bacteria and/or candida, this specimen does not meet the   Final   Molecular Comment  02/06/2022 strict criteria set by the FDA. The result interpretation should be   Final   Molecular Comment 02/06/2022 considered in conjunction with the patient's clinical history.   Final   Comment  02/06/2022 Normal Reference Range Trichomonas - Negative   Final   Glucose-Capillary 02/06/2022 197 (H)  70 - 99 mg/dL Final   Glucose reference range applies only to samples taken after fasting for at least 8 hours.   Glucose-Capillary 02/07/2022 129 (H)  70 - 99 mg/dL Final   Glucose reference range applies only to samples taken after fasting for at least 8 hours.   Glucose-Capillary 02/07/2022 207 (H)  70 - 99 mg/dL Final   Glucose reference range applies only to samples taken after fasting for at least 8 hours.   Glucose-Capillary 02/07/2022 153 (H)  70 - 99 mg/dL Final   Glucose reference range applies only to samples taken after fasting for at least 8 hours.   Glucose-Capillary 02/08/2022 129 (H)  70 - 99 mg/dL Final   Glucose reference range applies only to samples taken after fasting for at least 8 hours.   Glucose-Capillary 02/08/2022 107 (H)  70 - 99 mg/dL Final   Glucose reference range applies only to samples taken after fasting for at least 8 hours.  Admission on 01/23/2022, Discharged on 01/24/2022  Component Date Value Ref Range Status   Sodium 01/23/2022 140  135 - 145 mmol/L Final   Potassium 01/23/2022 4.4  3.5 - 5.1 mmol/L Final   Chloride 01/23/2022 101  98 - 111 mmol/L Final   CO2 01/23/2022 25  22 - 32 mmol/L Final   Glucose, Bld 01/23/2022 198 (H)  70 - 99 mg/dL Final   Glucose reference range applies only to samples taken after fasting for at least 8 hours.   BUN 01/23/2022 13  6 - 20 mg/dL Final   Creatinine, Ser 01/23/2022 0.62  0.44 - 1.00 mg/dL Final   Calcium 01/23/2022 9.3  8.9 - 10.3 mg/dL Final   GFR, Estimated 01/23/2022 >60  >60 mL/min Final   Comment: (NOTE) Calculated using the CKD-EPI Creatinine Equation (2021)    Anion gap 01/23/2022 14  5 - 15 Final   Performed at [facility], [place] [address] [place], [place] [address]   WBC 01/23/2022 5.8  4.0 - 10.5 K/uL Final   RBC 01/23/2022 4.63  3.87 - 5.11 MIL/uL Final   Hemoglobin 01/23/2022 11.7  (L)  12.0 - 15.0 g/dL Final   HCT 01/23/2022 37.4  36.0 - 46.0 % Final   MCV 01/23/2022 80.8  80.0 - 100.0 fL Final   MCH 01/23/2022 25.3 (L)  26.0 - 34.0 pg Final   MCHC 01/23/2022 31.3  30.0 - 36.0 g/dL Final   RDW 01/23/2022 17.9 (H)  11.5 - 15.5 % Final   Platelets 01/23/2022 333  150 - 400 K/uL Final   nRBC 01/23/2022 0.0  0.0 - 0.2 % Final   Performed at [name] [address], [place], [place] [address]   Troponin I (High Sensitivity) 01/23/2022 6  <18 ng/L Final   Comment: (NOTE) Elevated high sensitivity troponin I (hsTnI) values and significant  changes across serial measurements may suggest ACS but many other  chronic and acute conditions are known to elevate hsTnI results.  Refer to the "Links" section for chest pain algorithms and additional  guidance. Performed at [facility], [place] [address] [place], [place] [address]    Troponin I (High Sensitivity) 01/23/2022  5  <18 ng/L Final   Comment: (NOTE) Elevated high sensitivity troponin I (hsTnI) values and significant  changes across serial measurements may suggest ACS but many other  chronic and acute conditions are known to elevate hsTnI results.  Refer to the "Links" section for chest pain algorithms and additional  guidance. Performed at [facility], [place] [address] [place], [place] [address]    SARS Coronavirus 2 by RT PCR 01/23/2022 NEGATIVE  NEGATIVE Final   Comment: (NOTE) SARS-CoV-2 target nucleic acids are NOT DETECTED.  The SARS-CoV-2 RNA is generally detectable in upper and lower respiratory specimens during the acute phase of infection. The lowest concentration of SARS-CoV-2 viral copies this assay can detect is 250 copies / mL. A negative result does not preclude SARS-CoV-2 infection and should not be used as the sole basis for treatment or other patient management decisions.  A negative result may occur with improper specimen collection / handling, submission of specimen  other than nasopharyngeal swab, presence of viral mutation(s) within the areas targeted by this assay, and inadequate number of viral copies (<250 copies / mL). A negative result must be combined with clinical observations, patient history, and epidemiological information.  Fact Sheet for Patients:   https://www.patel.info/  Fact Sheet for Healthcare Providers: https://hall.com/  This test is not yet approved or                           cleared by the [place] FDA and has been authorized for detection and/or diagnosis of SARS-CoV-2 by FDA under an Emergency Use Authorization (EUA).  This EUA will remain in effect (meaning this test can be used) for the duration of the COVID-19 declaration under Section 564(b)(1) of the Act, 21 U.S.C. section 360bbb-3(b)(1), unless the authorization is terminated or revoked sooner.  Performed at [facility], [place] [address], [place], [place] [address]    B Natriuretic Peptide 01/23/2022 41.3  0.0 - 100.0 pg/mL Final   Performed at [facility] [address], [place], [place] [address]   Group A Strep by PCR 01/23/2022 NOT DETECTED  NOT DETECTED Final   Performed at [facility], [place] [address] [place], [place] [address]   Color, Urine 01/23/2022 YELLOW  YELLOW Final   APPearance 01/23/2022 CLEAR  CLEAR Final   Specific Gravity, Urine 01/23/2022 1.018  1.005 - 1.030 Final   pH 01/23/2022 7.0  5.0 - 8.0 Final   Glucose, UA 01/23/2022 NEGATIVE  NEGATIVE mg/dL Final   Hgb urine dipstick 01/23/2022 NEGATIVE  NEGATIVE Final   Bilirubin Urine 01/23/2022 NEGATIVE  NEGATIVE Final   Ketones, ur 01/23/2022 NEGATIVE  NEGATIVE mg/dL Final   Protein, ur 01/23/2022 NEGATIVE  NEGATIVE mg/dL Final   Nitrite 01/23/2022 NEGATIVE  NEGATIVE Final   Leukocytes,Ua 01/23/2022 TRACE (A)  NEGATIVE Final   RBC / HPF 01/23/2022 0-5  0 - 5 RBC/hpf Final   WBC, UA 01/23/2022 0-5  0 - 5 WBC/hpf Final    Bacteria, UA 01/23/2022 NONE SEEN  NONE SEEN Final   Squamous Epithelial / LPF 01/23/2022 0-5  0 - 5 Final   Mucus 01/23/2022 PRESENT   Final   Performed at [facility], [place] [address] [name], [place] [address]   SARS Coronavirus 2 by RT PCR 01/23/2022 NEGATIVE  NEGATIVE Final   Comment: (NOTE) SARS-CoV-2 target nucleic acids are NOT DETECTED.  The SARS-CoV-2 RNA is generally detectable in upper  respiratory specimens during the acute phase of infection. The lowest concentration of SARS-CoV-2 viral copies this assay can detect is 138 copies/mL. A negative result does not preclude SARS-Cov-2 infection and should not be used as the sole basis for treatment or other patient management decisions. A negative result may occur with  improper specimen collection/handling, submission of specimen other than nasopharyngeal swab, presence of viral mutation(s) within the areas targeted by this assay, and inadequate number of viral copies(<138 copies/mL). A negative result must be combined with clinical observations, patient history, and epidemiological information. The expected result is Negative.  Fact Sheet for Patients:  EntrepreneurPulse.com.au  Fact Sheet for Healthcare Providers:  IncredibleEmployment.be  This test is no                          t yet approved or cleared by the [place] FDA and  has been authorized for detection and/or diagnosis of SARS-CoV-2 by FDA under an Emergency Use Authorization (EUA). This EUA will remain  in effect (meaning this test can be used) for the duration of the COVID-19 declaration under Section 564(b)(1) of the Act, 21 U.S.C.section 360bbb-3(b)(1), unless the authorization is terminated  or revoked sooner.       Influenza A by PCR 01/23/2022 NEGATIVE  NEGATIVE Final   Influenza B by PCR 01/23/2022 NEGATIVE  NEGATIVE Final   Comment: (NOTE) The Xpert Xpress SARS-CoV-2/FLU/RSV plus assay is intended as  an aid in the diagnosis of influenza from Nasopharyngeal swab specimens and should not be used as a sole basis for treatment. Nasal washings and aspirates are unacceptable for Xpert Xpress SARS-CoV-2/FLU/RSV testing.  Fact Sheet for Patients: EntrepreneurPulse.com.au  Fact Sheet for Healthcare Providers: IncredibleEmployment.be  This test is not yet approved or cleared by the [place] FDA and has been authorized for detection and/or diagnosis of SARS-CoV-2 by FDA under an Emergency Use Authorization (EUA). This EUA will remain in effect (meaning this test can be used) for the duration of the COVID-19 declaration under Section 564(b)(1) of the Act, 21 U.S.C. section 360bbb-3(b)(1), unless the authorization is terminated or revoked.  Performed at [facility], [place] [address], [place], [place] [address]    WBC 01/24/2022 6.1  4.0 - 10.5 K/uL Final   RBC 01/24/2022 4.60  3.87 - 5.11 MIL/uL Final   Hemoglobin 01/24/2022 11.7 (L)  12.0 - 15.0 g/dL Final   HCT 01/24/2022 37.0  36.0 - 46.0 % Final   MCV 01/24/2022 80.4  80.0 - 100.0 fL Final   MCH 01/24/2022 25.4 (L)  26.0 - 34.0 pg Final   MCHC 01/24/2022 31.6  30.0 - 36.0 g/dL Final   RDW 01/24/2022 17.6 (H)  11.5 - 15.5 % Final   Platelets 01/24/2022 334  150 - 400 K/uL Final   nRBC 01/24/2022 0.0  0.0 - 0.2 % Final   Neutrophils Relative % 01/24/2022 53  % Final   Neutro Abs 01/24/2022 3.3  1.7 - 7.7 K/uL Final   Lymphocytes Relative 01/24/2022 33  % Final   Lymphs Abs 01/24/2022 2.0  0.7 - 4.0 K/uL Final   Monocytes Relative 01/24/2022 11  % Final   Monocytes Absolute 01/24/2022 0.7  0.1 - 1.0 K/uL Final   Eosinophils Relative 01/24/2022 2  % Final   Eosinophils Absolute 01/24/2022 0.1  0.0 - 0.5 K/uL Final   Basophils Relative 01/24/2022 1  % Final   Basophils Absolute 01/24/2022 0.1  0.0 - 0.1 K/uL Final  Immature Granulocytes 01/24/2022 0  % Final   Abs Immature Granulocytes  01/24/2022 0.02  0.00 - 0.07 K/uL Final   Performed at [facility], [place] [address], [place], [place] [address]   Glucose-Capillary 01/24/2022 110 (H)  70 - 99 mg/dL Final   Glucose reference range applies only to samples taken after fasting for at least 8 hours.  No results displayed because visit has over 200 results.    Admission on 11/22/2021, Discharged on 11/22/2021  Component Date Value Ref Range Status   Glucose-Capillary 11/22/2021 169 (H)  70 - 99 mg/dL Final   Glucose reference range applies only to samples taken after fasting for at least 8 hours.  No results displayed because visit has over 200 results.    Admission on 10/04/2021, Discharged on 10/22/2021  Component Date Value Ref Range Status   SARS Coronavirus 2 by RT PCR 10/04/2021 NEGATIVE  NEGATIVE Final   Comment: (NOTE) SARS-CoV-2 target nucleic acids are NOT DETECTED.  The SARS-CoV-2 RNA is generally detectable in upper respiratory specimens during the acute phase of infection. The lowest concentration of SARS-CoV-2 viral copies this assay can detect is 138 copies/mL. A negative result does not preclude SARS-Cov-2 infection and should not be used as the sole basis for treatment or other patient management decisions. A negative result may occur with  improper specimen collection/handling, submission of specimen other than nasopharyngeal swab, presence of viral mutation(s) within the areas targeted by this assay, and inadequate number of viral copies(<138 copies/mL). A negative result must be combined with clinical observations, patient history, and epidemiological information. The expected result is Negative.  Fact Sheet for Patients:  EntrepreneurPulse.com.au  Fact Sheet for Healthcare Providers:  IncredibleEmployment.be  This test is no                          t yet approved or cleared by the [place] FDA and  has been authorized for detection and/or diagnosis  of SARS-CoV-2 by FDA under an Emergency Use Authorization (EUA). This EUA will remain  in effect (meaning this test can be used) for the duration of the COVID-19 declaration under Section 564(b)(1) of the Act, 21 U.S.C.section 360bbb-3(b)(1), unless the authorization is terminated  or revoked sooner.       Influenza A by PCR 10/04/2021 NEGATIVE  NEGATIVE Final   Influenza B by PCR 10/04/2021 NEGATIVE  NEGATIVE Final   Comment: (NOTE) The Xpert Xpress SARS-CoV-2/FLU/RSV plus assay is intended as an aid in the diagnosis of influenza from Nasopharyngeal swab specimens and should not be used as a sole basis for treatment. Nasal washings and aspirates are unacceptable for Xpert Xpress SARS-CoV-2/FLU/RSV testing.  Fact Sheet for Patients: EntrepreneurPulse.com.au  Fact Sheet for Healthcare Providers: IncredibleEmployment.be  This test is not yet approved or cleared by the [place] FDA and has been authorized for detection and/or diagnosis of SARS-CoV-2 by FDA under an Emergency Use Authorization (EUA). This EUA will remain in effect (meaning this test can be used) for the duration of the COVID-19 declaration under Section 564(b)(1) of the Act, 21 U.S.C. section 360bbb-3(b)(1), unless the authorization is terminated or revoked.  Performed at [facility], [place] [address], [place], [place] [address]    WBC 10/04/2021 8.4  4.0 - 10.5 K/uL Final   RBC 10/04/2021 4.42  3.87 - 5.11 MIL/uL Final   Hemoglobin 10/04/2021 11.6 (L)  12.0 - 15.0 g/dL Final   HCT 10/04/2021 35.7 (L)  36.0 - 46.0 % Final   MCV 10/04/2021 80.8  80.0 - 100.0 fL Final   MCH 10/04/2021 26.2  26.0 - 34.0 pg Final   MCHC 10/04/2021 32.5  30.0 - 36.0 g/dL Final   RDW 10/04/2021 16.0 (H)  11.5 - 15.5 % Final   Platelets 10/04/2021 372  150 - 400 K/uL Final   nRBC 10/04/2021 0.0  0.0 - 0.2 % Final   Neutrophils Relative % 10/04/2021 68  % Final   Neutro Abs 10/04/2021 5.7   1.7 - 7.7 K/uL Final   Lymphocytes Relative 10/04/2021 22  % Final   Lymphs Abs 10/04/2021 1.8  0.7 - 4.0 K/uL Final   Monocytes Relative 10/04/2021 8  % Final   Monocytes Absolute 10/04/2021 0.7  0.1 - 1.0 K/uL Final   Eosinophils Relative 10/04/2021 1  % Final   Eosinophils Absolute 10/04/2021 0.1  0.0 - 0.5 K/uL Final   Basophils Relative 10/04/2021 1  % Final   Basophils Absolute 10/04/2021 0.1  0.0 - 0.1 K/uL Final   Immature Granulocytes 10/04/2021 0  % Final   Abs Immature Granulocytes 10/04/2021 0.03  0.00 - 0.07 K/uL Final   Performed at [facility], [place] [address], [place], [place] [address]   Sodium 10/04/2021 136  135 - 145 mmol/L Final   Potassium 10/04/2021 4.2  3.5 - 5.1 mmol/L Final   Chloride 10/04/2021 104  98 - 111 mmol/L Final   CO2 10/04/2021 25  22 - 32 mmol/L Final   Glucose, Bld 10/04/2021 100 (H)  70 - 99 mg/dL Final   Glucose reference range applies only to samples taken after fasting for at least 8 hours.   BUN 10/04/2021 9  6 - 20 mg/dL Final   Creatinine, Ser 10/04/2021 0.52  0.44 - 1.00 mg/dL Final   Calcium 10/04/2021 9.0  8.9 - 10.3 mg/dL Final   Total Protein 10/04/2021 7.0  6.5 - 8.1 g/dL Final   Albumin 10/04/2021 3.2 (L)  3.5 - 5.0 g/dL Final   AST 10/04/2021 13 (L)  15 - 41 U/L Final   ALT 10/04/2021 10  0 - 44 U/L Final   Alkaline Phosphatase 10/04/2021 61  38 - 126 U/L Final   Total Bilirubin 10/04/2021 0.3  0.3 - 1.2 mg/dL Final   GFR, Estimated 10/04/2021 >60  >60 mL/min Final   Comment: (NOTE) Calculated using the CKD-EPI Creatinine Equation (2021)    Anion gap 10/04/2021 7  5 - 15 Final   Performed at [place] [address], [place], [place] [address]   Hgb A1c MFr Bld 10/04/2021 7.0 (H)  4.8 - 5.6 % Final   Comment: (NOTE) Pre diabetes:          5.7%-6.4%  Diabetes:              >6.4%  Glycemic control for   <7.0% adults with diabetes    Mean Plasma Glucose 10/04/2021 154.2  mg/dL Final   Performed at [facility], [place] [address], [place], [place] [address]   Cholesterol 10/04/2021 178  0 - 200 mg/dL Final   Triglycerides 10/04/2021 155 (H)  <150 mg/dL Final   HDL 10/04/2021 52  >40 mg/dL Final   Total CHOL/HDL Ratio 10/04/2021 3.4  RATIO Final   VLDL 10/04/2021 31  0 - 40 mg/dL Final   LDL Cholesterol 10/04/2021 95  0 - 99 mg/dL Final   Comment:  Total Cholesterol/HDL:CHD Risk Coronary Heart Disease Risk Table                     Men   Women  1/2 Average Risk   3.4   3.3  Average Risk       5.0   4.4  2 X Average Risk   9.6   7.1  3 X Average Risk  23.4   11.0        Use the calculated Patient Ratio above and the CHD Risk Table to determine the patient's CHD Risk.        ATP III CLASSIFICATION (LDL):  <100     mg/dL   Optimal  100-129  mg/dL   Near or Above                    Optimal  130-159  mg/dL   Borderline  160-189  mg/dL   High  >190     mg/dL   Very High Performed at [place] [facility] [address], [place], [place] [address]    POC Amphetamine UR 10/04/2021 None Detected  NONE DETECTED (Cut Off Level 1000 ng/mL) Final   POC Secobarbital (BAR) 10/04/2021 None Detected  NONE DETECTED (Cut Off Level 300 ng/mL) Final   POC Buprenorphine (BUP) 10/04/2021 None Detected  NONE DETECTED (Cut Off Level 10 ng/mL) Final   POC Oxazepam (BZO) 10/04/2021 None Detected  NONE DETECTED (Cut Off Level 300 ng/mL) Final   POC Cocaine UR 10/04/2021 None Detected  NONE DETECTED (Cut Off Level 300 ng/mL) Final   POC Methamphetamine UR 10/04/2021 None Detected  NONE DETECTED (Cut Off Level 1000 ng/mL) Final   POC Morphine 10/04/2021 None Detected  NONE DETECTED (Cut Off Level 300 ng/mL) Final   POC Methadone UR 10/04/2021 None Detected  NONE DETECTED (Cut Off Level 300 ng/mL) Final   POC Oxycodone UR 10/04/2021 Positive (A)  NONE DETECTED (Cut Off Level 100 ng/mL) Final   POC Marijuana UR 10/04/2021 None Detected  NONE DETECTED (Cut Off Level 50 ng/mL) Final   SARSCOV2ONAVIRUS 2  AG 10/04/2021 NEGATIVE  NEGATIVE Final   Comment: (NOTE) SARS-CoV-2 antigen NOT DETECTED.   Negative results are presumptive.  Negative results do not preclude SARS-CoV-2 infection and should not be used as the sole basis for treatment or other patient management decisions, including infection  control decisions, particularly in the presence of clinical signs and  symptoms consistent with COVID-19, or in those who have been in contact with the virus.  Negative results must be combined with clinical observations, patient history, and epidemiological information. The expected result is Negative.  Fact Sheet for Patients: HandmadeRecipes.com.cy  Fact Sheet for Healthcare Providers: FuneralLife.at  This test is not yet approved or cleared by the [place] FDA and  has been authorized for detection and/or diagnosis of SARS-CoV-2 by FDA under an Emergency Use Authorization (EUA).  This EUA will remain in effect (meaning this test can be used) for the duration of  the COV                          ID-19 declaration under Section 564(b)(1) of the Act, 21 U.S.C. section 360bbb-3(b)(1), unless the authorization is terminated or revoked sooner.     Valproic Acid Lvl 10/04/2021 51  50.0 - 100.0 ug/mL Final   Performed at [place] [address] [place], [place] [address]   Valproic Acid  Lvl 10/08/2021 57  50.0 - 100.0 ug/mL Final   Performed at [facility] [address] [place], [place] [address]   Glucose-Capillary 10/09/2021 123 (H)  70 - 99 mg/dL Final   Glucose reference range applies only to samples taken after fasting for at least 8 hours.  There may be more visits with results that are not included.    Blood Alcohol level:  Lab Results  Component Value Date   ETH <10 11/23/2021   ETH <10 56/81/2751    Metabolic Disorder Labs: Lab Results  Component Value Date   HGBA1C 6.7 (H) 12/28/2021   MPG 145.59  12/28/2021   MPG 154.2 10/04/2021   Lab Results  Component Value Date   PROLACTIN 3.8 (L) 11/23/2021   Lab Results  Component Value Date   CHOL 171 11/23/2021   TRIG 125 11/23/2021   HDL 65 11/23/2021   CHOLHDL 2.6 11/23/2021   VLDL 25 11/23/2021   LDLCALC 81 11/23/2021   LDLCALC 95 10/04/2021    Therapeutic Lab Levels: No results found for: "LITHIUM" Lab Results  Component Value Date   VALPROATE 17 (L) 02/17/2022   VALPROATE 33 (L) 01/18/2022   No results found for: "CBMZ"  Physical Findings   PHQ2-9    Flowsheet Row ED from 11/23/2021 in [facility]  PHQ-2 Total Score 2  PHQ-9 Total Score 3      Flowsheet Row ED from 02/18/2022 in [facility] ED from 02/17/2022 in [place] [facility] ED from 02/09/2022 in [place] No Risk No Risk No Risk        Musculoskeletal  Strength & Muscle Tone: within normal limits Gait & Station: normal Patient leans: N/A  Psychiatric Specialty Exam  Presentation  General Appearance:  Casual; Disheveled  Eye Contact: Fair  Speech: Clear and Coherent; Normal Rate  Speech Volume: Normal  Handedness: Right   Mood and Affect  Mood: Euthymic  Affect: Appropriate; Congruent   Thought Process  Thought Processes: Coherent; Goal Directed; Linear  Descriptions of Associations:Intact  Orientation:Full (Time, Place and Person)  Thought Content:Logical  Diagnosis of Schizophrenia or Schizoaffective disorder in past: Yes  Duration of Psychotic Symptoms: No data recorded  Hallucinations:Hallucinations: None   Ideas of Reference:None  Suicidal Thoughts:Suicidal Thoughts: No   Homicidal Thoughts:Homicidal Thoughts: No    Sensorium  Memory: Immediate Good; Recent Good; Remote Good  Judgment: Fair  Insight: Fair   Manufacturing systems engineer: Fair  Attention Span: Fair  Recall: [employer] of Knowledge: Fair  Language: Fair   Psychomotor Activity  Psychomotor Activity: Psychomotor Activity: Normal    Assets  Assets: Communication Skills; Desire for Improvement; Financial Resources/Insurance; Resilience   Sleep  Sleep: Sleep: Good    No data recorded  Physical Exam  Physical Exam Constitutional:      General: She is not in acute distress.    Appearance: She is not ill-appearing, toxic-appearing or diaphoretic.  Eyes:     General: No scleral icterus. Cardiovascular:     Rate and Rhythm: Tachycardia present.  Pulmonary:     Effort: Pulmonary effort is normal. No respiratory distress.  Neurological:     Mental Status: She is alert and oriented to person, place, and time.  Psychiatric:        Attention and Perception: Attention and perception normal.        Mood and Affect: Mood normal. Affect  is flat.        Speech: Speech normal.        Behavior: Behavior normal. Behavior is cooperative.        Thought Content: Thought content normal.    Review of Systems  Constitutional:  Negative for chills and fever.  Respiratory:  Negative for shortness of breath.   Cardiovascular:  Negative for chest pain and palpitations.  Gastrointestinal:  Negative for abdominal pain.  Neurological:  Negative for headaches.   Blood pressure (!) 148/88, pulse (!) 117, temperature 97.8 F (36.6 C), temperature source Oral, resp. rate 18, SpO2 96 %. There is no height or weight on file to calculate BMI.  Treatment Plan Summary: Plan Pt remains psychiatrically cleared. Disposition is pending placement.  France Ravens, MD 02/19/2022 9:10 AM

## 2022-02-19 NOTE — ED Notes (Signed)
Pt is in the bed resting. Respirations are even and unlabored. No acute distress noted. Will continue to monitor for safetyPt is in the bed watching TV. Respirations are even and unlabored. No acute distress noted. Will continue to monitor for safety

## 2022-02-19 NOTE — ED Notes (Signed)
Pt resting quietly.  Breathing even and unlabored.   Staff will continue to monitor for safety.  

## 2022-02-20 DIAGNOSIS — F209 Schizophrenia, unspecified: Secondary | ICD-10-CM | POA: Diagnosis not present

## 2022-02-20 LAB — GLUCOSE, CAPILLARY
Glucose-Capillary: 126 mg/dL — ABNORMAL HIGH (ref 70–99)
Glucose-Capillary: 132 mg/dL — ABNORMAL HIGH (ref 70–99)
Glucose-Capillary: 125 mg/dL — ABNORMAL HIGH (ref 70–99)

## 2022-02-20 NOTE — ED Provider Notes (Addendum)
Behavioral Health Progress Note  Date and Time: 02/20/2022 10:47 AM Name: Teresa Murillo MRN:  [id number]  Subjective:  Teresa Murillo is a 61 y.o. female with a past psychiatric history of schizophrenia, aggressive behavior, and possible intellectual disability presenting to [facility] on 11/23/21 voluntarily as a walk in via [facility] with complaints that she was locked out of the group home. Patient has been dismissed from group home due to multiple elopements. Pt had already been discharged from this facility but had been living there temporarily while DSS was looking for new placement. Pt is boarding at [facility].   Patient assessed face to face by this provider and chart reviewed on 02/20/2022.  On today's assessment patient is observed sitting in her bed awake.  She is pleasant upon approach.  She is alert/oriented x 4, cooperative, calm, and attentive.  She has normal speech and behavior.  She continues to deny depression, SI, and AVH.  She has a euthymic affect.  Objectively she does not appear to be responding to internal/external stimuli.  States that she is excited for Christmas and asked if any Christmas activities would be done in the facility.  She has continued to remain cooperative and appropriate with staff and other patients.  Patient remains psychiatrically cleared.  TOC/DSS is working on placement.   AID to Capacity Evaluation (ACE) completed by Dr. Louis Meckel, Dr. Dwyane Dee on 12/11/2021.  Please see media tab for full details.    [name], PhD completed: Wechsler Adult Intelligence Scale-4, Ms. Dass achieved a full-scale IQ score of 73 and a percentile rank of 4 placing her in the borderline range of intellectual functioning (12/14/2021) Please see consult note from [name], PhD on 12/14/2021.   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 On the Wechsler Adult Intelligence Scale-4, Ms. Rau achieved a full-scale IQ score of 73 and a percentile rank of 4 placing her in the borderline range of intellectual functioning.    Chronic obstructive pulmonary disease (COPD) (HCC)    Glaucoma    Hyperlipidemia    Hypertension    Iron deficiency    Schizoaffective disorder (HCC)    Type 2 diabetes mellitus (HCC)     Past Surgical History:  Procedure Laterality Date   TUBAL LIGATION     Family History:  Family History  Problem Relation Age of Onset   Breast cancer Maternal Grandmother    Family Psychiatric  History: see H&P Social History:  Social History   Substance and Sexual Activity  Alcohol Use Not Currently     Social History   Substance and Sexual Activity  Drug Use Not Currently    Social History   Socioeconomic History   Marital status: Divorced    Spouse name: Not on file   Number of children: Not on file   Years of education: Not on file   Highest education level: Not on file  Occupational History   Not on file  Tobacco Use   Smoking status: Former    Packs/day: 1.00    Types: Cigars, Cigarettes    Quit date: 11/09/2021    Years since quitting: 0.2   Smokeless tobacco: Current  Vaping Use   Vaping Use: Never used  Substance and Sexual Activity  Alcohol use: Not Currently   Drug use: Not Currently   Sexual activity: Not Currently    Birth control/protection: Surgical  Other Topics Concern   Not on file  Social History Narrative   Not on file   Social Determinants of Health   Financial Resource Strain: Not on file  Food Insecurity: Not on file  Transportation Needs: Not on file  Physical Activity: Not on file  Stress: Not on file  Social Connections: Not on file   SDOH:  SDOH Screenings   Depression (PHQ2-9): Low Risk   (11/23/2021)  Tobacco Use: High Risk (02/17/2022)   Additional Social History:                         Sleep: Good  Appetite:  Good  Current Medications:  Current Facility-Administered Medications  Medication Dose Route Frequency Provider Last Rate Last Admin   acetaminophen (TYLENOL) tablet 650 mg  650 mg Oral Q6H PRN [name], NP   650 mg at 02/19/22 2132   albuterol (VENTOLIN HFA) 108 (90 Base) MCG/ACT inhaler 2 puff  2 puff Inhalation Q6H PRN [name], NP       alum & mag hydroxide-simeth (MAALOX/MYLANTA) 200-200-20 MG/5ML suspension 30 mL  30 mL Oral Q4H PRN [name], NP   30 mL at 02/19/22 1009   apixaban (ELIQUIS) tablet 5 mg  5 mg Oral BID [name], NP   5 mg at 02/20/22 0914   ARIPiprazole (ABILIFY) tablet 10 mg  10 mg Oral Daily [name], NP   10 mg at 02/20/22 0914   cloZAPine (CLOZARIL) tablet 75 mg  75 mg Oral BID France Ravens, MD   75 mg at 02/20/22 2683   colchicine tablet 0.6 mg  0.6 mg Oral QHS [name], NP   0.6 mg at 02/19/22 2121   diltiazem (CARDIZEM CD) 24 hr capsule 240 mg  240 mg Oral Daily [name], NP   240 mg at 02/20/22 0914   divalproex (DEPAKOTE ER) 24 hr tablet 500 mg  500 mg Oral BID [name], NP   500 mg at 02/20/22 4196   haloperidol (HALDOL) tablet 10 mg  10 mg Oral TID PRN [name], NP       insulin aspart (novoLOG) injection 0-9 Units  0-9 Units Subcutaneous TID WC France Ravens, MD   1 Units at 02/20/22 0850   insulin glargine-yfgn (SEMGLEE) injection 12 Units  12 Units Subcutaneous Daily [name], NP   12 Units at 02/20/22 0921   latanoprost (XALATAN) 0.005 % ophthalmic solution 1 drop  1 drop Both Eyes QHS [name], NP   1 drop at 02/18/22 2114   magnesium hydroxide (MILK OF MAGNESIA) suspension 30 mL  30 mL Oral Daily PRN [name], NP       metFORMIN (GLUCOPHAGE) tablet 500 mg  500 mg Oral BID WC [name], NP   500 mg at 02/20/22 0834   mometasone-formoterol (DULERA) 200-5 MCG/ACT  inhaler 2 puff  2 puff Inhalation BID [name], NP   2 puff at 02/20/22 0838   nicotine (NICODERM CQ - dosed in mg/24 hr) patch 7 mg  7 mg Transdermal Daily [name], NP   7 mg at 02/20/22 0913   valbenazine (INGREZZA) capsule 40 mg  40 mg Oral q AM [name], NP   40 mg at 02/20/22 0600   Vitamin D (Ergocalciferol) (DRISDOL) 1.25 MG (50000 UNIT) capsule 50,000 Units  50,000 Units  Oral Q Leonie Green, NP   50,000 Units at 02/18/22 8916   Current Outpatient Medications  Medication Sig Dispense Refill   Accu-Chek Softclix Lancets lancets Use as directed up to four times daily 100 each 0   apixaban (ELIQUIS) 5 MG TABS tablet Take 1 tablet (5 mg total) by mouth 2 (two) times daily. 60 tablet 0   ARIPiprazole (ABILIFY) 10 MG tablet Take 1 tablet (10 mg total) by mouth daily. 30 tablet 0   Blood Glucose Monitoring Suppl (ACCU-CHEK GUIDE) w/Device KIT Use as directed up to four times daily 1 kit 0   budesonide-formoterol (SYMBICORT) 160-4.5 MCG/ACT inhaler Inhale 2 puffs into the lungs in the morning and at bedtime.     Cholecalciferol (VITAMIN D3) 1.25 MG (50000 UT) CAPS Take 50,000 Units by mouth every Thursday.     clozapine (CLOZARIL) 50 MG tablet Take 1 tablet (50 mg total) by mouth daily. 30 tablet 0   colchicine 0.6 MG tablet Take 1 tablet (0.6 mg total) by mouth at bedtime. 30 tablet 0   diltiazem (CARDIZEM CD) 240 MG 24 hr capsule Take 1 capsule (240 mg total) by mouth daily. (Patient not taking: Reported on 11/24/2021) 30 capsule 0   divalproex (DEPAKOTE ER) 500 MG 24 hr tablet Take 1 tablet (500 mg total) by mouth 2 (two) times daily. 60 tablet 0   glucose blood test strip Use as directed up to four times daily 50 each 0   haloperidol (HALDOL) 10 MG tablet Take 1 tablet (10 mg total) by mouth 3 (three) times daily as needed for agitation (and psychotic symptoms).     INGREZZA 40 MG capsule Take 1 capsule (40 mg total) by mouth in the morning. 30 capsule 0   insulin aspart  (NOVOLOG) 100 UNIT/ML FlexPen Before each meal 3 times a day, 140-199 - 2 units, 200-250 - 4 units, 251-299 - 6 units,  300-349 - 8 units,  350 or above 10 units. Insulin PEN if approved, provide syringes and needles if needed.Please switch to any approved short acting Insulin if needed. 15 mL 0   insulin glargine (LANTUS) 100 UNIT/ML Solostar Pen Inject 12 Units into the skin daily. 15 mL 0   Insulin Pen Needle 32G X 4 MM MISC Use 4 times a day with insulin, 1 month supply. 100 each 0   latanoprost (XALATAN) 0.005 % ophthalmic solution Place 1 drop into both eyes at bedtime.     metFORMIN (GLUCOPHAGE) 500 MG tablet Take 500 mg by mouth 2 (two) times daily with a meal.     nicotine (NICODERM CQ - DOSED IN MG/24 HR) 7 mg/24hr patch Place 1 patch (7 mg total) onto the skin daily. 28 patch 0   PROAIR HFA 108 (90 Base) MCG/ACT inhaler Inhale 2 puffs into the lungs every 6 (six) hours as needed for wheezing or shortness of breath.      Labs  Lab Results:  Admission on 02/18/2022  Component Date Value Ref Range Status   Glucose-Capillary 02/18/2022 254 (H)  70 - 99 mg/dL Final   Glucose reference range applies only to samples taken after fasting for at least 8 hours.   Glucose-Capillary 02/18/2022 112 (H)  70 - 99 mg/dL Final   Glucose reference range applies only to samples taken after fasting for at least 8 hours.   Glucose-Capillary 02/19/2022 159 (H)  70 - 99 mg/dL Final   Glucose reference range applies only to samples taken after fasting for at least 8  hours.   Glucose-Capillary 02/19/2022 160 (H)  70 - 99 mg/dL Final   Glucose reference range applies only to samples taken after fasting for at least 8 hours.   Glucose-Capillary 02/19/2022 177 (H)  70 - 99 mg/dL Final   Glucose reference range applies only to samples taken after fasting for at least 8 hours.   Glucose-Capillary 02/19/2022 147 (H)  70 - 99 mg/dL Final   Glucose reference range applies only to samples taken after fasting for at  least 8 hours.   Glucose-Capillary 02/20/2022 126 (H)  70 - 99 mg/dL Final   Glucose reference range applies only to samples taken after fasting for at least 8 hours.  Admission on 02/17/2022, Discharged on 02/18/2022  Component Date Value Ref Range Status   Sodium 02/17/2022 136  135 - 145 mmol/L Final   Potassium 02/17/2022 3.9  3.5 - 5.1 mmol/L Final   Chloride 02/17/2022 101  98 - 111 mmol/L Final   CO2 02/17/2022 25  22 - 32 mmol/L Final   Glucose, Bld 02/17/2022 113 (H)  70 - 99 mg/dL Final   Glucose reference range applies only to samples taken after fasting for at least 8 hours.   BUN 02/17/2022 9  6 - 20 mg/dL Final   Creatinine, Ser 02/17/2022 0.50  0.44 - 1.00 mg/dL Final   Calcium 02/17/2022 8.9  8.9 - 10.3 mg/dL Final   GFR, Estimated 02/17/2022 >60  >60 mL/min Final   Comment: (NOTE) Calculated using the CKD-EPI Creatinine Equation (2021)    Anion gap 02/17/2022 10  5 - 15 Final   Performed at [facility], [place] [address] [place], [place] [address]   WBC 02/17/2022 6.5  4.0 - 10.5 K/uL Final   RBC 02/17/2022 4.24  3.87 - 5.11 MIL/uL Final   Hemoglobin 02/17/2022 10.2 (L)  12.0 - 15.0 g/dL Final   HCT 02/17/2022 33.2 (L)  36.0 - 46.0 % Final   MCV 02/17/2022 78.3 (L)  80.0 - 100.0 fL Final   MCH 02/17/2022 24.1 (L)  26.0 - 34.0 pg Final   MCHC 02/17/2022 30.7  30.0 - 36.0 g/dL Final   RDW 02/17/2022 17.2 (H)  11.5 - 15.5 % Final   Platelets 02/17/2022 390  150 - 400 K/uL Final   nRBC 02/17/2022 0.0  0.0 - 0.2 % Final   Performed at [facility], [place] [address], , [place] [address]   Troponin I (High Sensitivity) 02/17/2022 4  <18 ng/L Final   Comment: (NOTE) Elevated high sensitivity troponin I (hsTnI) values and significant  changes across serial measurements may suggest ACS but many other  chronic and acute conditions are known to elevate hsTnI results.  Refer to the "Links" section for chest pain algorithms and additional  guidance. Performed  at [facility], [place] [address] [place], [place] [address]    Valproic Acid Lvl 02/17/2022 17 (L)  50.0 - 100.0 ug/mL Final   Performed at [place] [address] [place], [place] [address]   B Natriuretic Peptide 02/17/2022 30.9  0.0 - 100.0 pg/mL Final   Performed at [place] [address], [place], [place] [address]   Troponin I (High Sensitivity) 02/18/2022 4  <18 ng/L Final   Comment: (NOTE) Elevated high sensitivity troponin I (hsTnI) values and significant  changes across serial measurements may suggest ACS but many other  chronic and acute conditions are known to elevate hsTnI results.  Refer  to the "Links" section for chest pain algorithms and additional  guidance. Performed at [facility], [place] [address], [place], [place] [address]    D-Dimer, Quant 02/18/2022 0.29  0.00 - 0.50 ug/mL-FEU Final   Comment: (NOTE) At the manufacturer cut-off value of 0.5 g/mL FEU, this assay has a negative predictive value of 95-100%.This assay is intended for use in conjunction with a clinical pretest probability (PTP) assessment model to exclude pulmonary embolism (PE) and deep venous thrombosis (DVT) in outpatients suspected of PE or DVT. Results should be correlated with clinical presentation. Performed at [facility], [place] [address], [place], [place] [address]   Admission on 02/09/2022, Discharged on 02/17/2022  Component Date Value Ref Range Status   Glucose-Capillary 02/09/2022 118 (H)  70 - 99 mg/dL Final   Glucose reference range applies only to samples taken after fasting for at least 8 hours.   Glucose-Capillary 02/10/2022 148 (H)  70 - 99 mg/dL Final   Glucose reference range applies only to samples taken after fasting for at least 8 hours.   Glucose-Capillary 02/10/2022 174 (H)  70 - 99 mg/dL Final   Glucose reference range applies only to samples taken after fasting for at least 8 hours.   Glucose-Capillary 02/10/2022 128 (H)  70 -  99 mg/dL Final   Glucose reference range applies only to samples taken after fasting for at least 8 hours.   Glucose-Capillary 02/10/2022 182 (H)  70 - 99 mg/dL Final   Glucose reference range applies only to samples taken after fasting for at least 8 hours.   Glucose-Capillary 02/11/2022 158 (H)  70 - 99 mg/dL Final   Glucose reference range applies only to samples taken after fasting for at least 8 hours.   Glucose-Capillary 02/11/2022 159 (H)  70 - 99 mg/dL Final   Glucose reference range applies only to samples taken after fasting for at least 8 hours.   Glucose-Capillary 02/11/2022 153 (H)  70 - 99 mg/dL Final   Glucose reference range applies only to samples taken after fasting for at least 8 hours.   Glucose-Capillary 02/11/2022 159 (H)  70 - 99 mg/dL Final   Glucose reference range applies only to samples taken after fasting for at least 8 hours.   Glucose-Capillary 02/11/2022 153 (H)  70 - 99 mg/dL Final   Glucose reference range applies only to samples taken after fasting for at least 8 hours.   Glucose-Capillary 02/11/2022 109 (H)  70 - 99 mg/dL Final   Glucose reference range applies only to samples taken after fasting for at least 8 hours.   Glucose-Capillary 02/11/2022 213 (H)  70 - 99 mg/dL Final   Glucose reference range applies only to samples taken after fasting for at least 8 hours.   Glucose-Capillary 02/11/2022 229 (H)  70 - 99 mg/dL Final   Glucose reference range applies only to samples taken after fasting for at least 8 hours.   Glucose-Capillary 02/12/2022 128 (H)  70 - 99 mg/dL Final   Glucose reference range applies only to samples taken after fasting for at least 8 hours.   Glucose-Capillary 02/12/2022 149 (H)  70 - 99 mg/dL Final   Glucose reference range applies only to samples taken after fasting for at least 8 hours.   Color, Urine 02/12/2022 YELLOW  YELLOW Final   APPearance 02/12/2022 CLEAR  CLEAR Final   Specific Gravity, Urine 02/12/2022 1.015  1.005 -  1.030 Final   pH 02/12/2022 5.0  5.0 -  8.0 Final   Glucose, UA 02/12/2022 NEGATIVE  NEGATIVE mg/dL Final   Hgb urine dipstick 02/12/2022 NEGATIVE  NEGATIVE Final   Bilirubin Urine 02/12/2022 NEGATIVE  NEGATIVE Final   Ketones, ur 02/12/2022 NEGATIVE  NEGATIVE mg/dL Final   Protein, ur 02/12/2022 NEGATIVE  NEGATIVE mg/dL Final   Nitrite 02/12/2022 NEGATIVE  NEGATIVE Final   Leukocytes,Ua 02/12/2022 NEGATIVE  NEGATIVE Final   Performed at [facility], [place] [address] [place], [place] [address]   Specimen Description 02/12/2022 URINE, CLEAN CATCH   Final   Special Requests 02/12/2022    Final                   Value:NONE Performed at [facility], [place] [address] [place], [place] [address]    Culture 02/12/2022 10,000 COLONIES/mL PROTEUS MIRABILIS (A)   Final   Report Status 02/12/2022 02/15/2022 FINAL   Final   Organism ID, Bacteria 02/12/2022 PROTEUS MIRABILIS (A)   Final   Glucose-Capillary 02/12/2022 128 (H)  70 - 99 mg/dL Final   Glucose reference range applies only to samples taken after fasting for at least 8 hours.   Glucose-Capillary 02/12/2022 196 (H)  70 - 99 mg/dL Final   Glucose reference range applies only to samples taken after fasting for at least 8 hours.   Glucose-Capillary 02/13/2022 168 (H)  70 - 99 mg/dL Final   Glucose reference range applies only to samples taken after fasting for at least 8 hours.   Glucose-Capillary 02/13/2022 153 (H)  70 - 99 mg/dL Final   Glucose reference range applies only to samples taken after fasting for at least 8 hours.   Glucose-Capillary 02/13/2022 134 (H)  70 - 99 mg/dL Final   Glucose reference range applies only to samples taken after fasting for at least 8 hours.   Glucose-Capillary 02/13/2022 178 (H)  70 - 99 mg/dL Final   Glucose reference range applies only to samples taken after fasting for at least 8 hours.   Glucose-Capillary 02/14/2022 156 (H)  70 - 99 mg/dL Final   Glucose reference range applies only to samples  taken after fasting for at least 8 hours.   Glucose-Capillary 02/14/2022 169 (H)  70 - 99 mg/dL Final   Glucose reference range applies only to samples taken after fasting for at least 8 hours.   Glucose-Capillary 02/14/2022 156 (H)  70 - 99 mg/dL Final   Glucose reference range applies only to samples taken after fasting for at least 8 hours.   Glucose-Capillary 02/14/2022 148 (H)  70 - 99 mg/dL Final   Glucose reference range applies only to samples taken after fasting for at least 8 hours.   Glucose-Capillary 02/15/2022 159 (H)  70 - 99 mg/dL Final   Glucose reference range applies only to samples taken after fasting for at least 8 hours.   Glucose-Capillary 02/15/2022 125 (H)  70 - 99 mg/dL Final   Glucose reference range applies only to samples taken after fasting for at least 8 hours.   Glucose-Capillary 02/15/2022 142 (H)  70 - 99 mg/dL Final   Glucose reference range applies only to samples taken after fasting for at least 8 hours.   Glucose-Capillary 02/15/2022 128 (H)  70 - 99 mg/dL Final   Glucose reference range applies only to samples taken after fasting for at least 8 hours.   Glucose-Capillary 02/16/2022 137 (H)  70 - 99 mg/dL Final   Glucose reference range applies only to samples taken after fasting  for at least 8 hours.   WBC 02/16/2022 6.0  4.0 - 10.5 K/uL Final   RBC 02/16/2022 4.48  3.87 - 5.11 MIL/uL Final   Hemoglobin 02/16/2022 10.7 (L)  12.0 - 15.0 g/dL Final   HCT 02/16/2022 35.4 (L)  36.0 - 46.0 % Final   MCV 02/16/2022 79.0 (L)  80.0 - 100.0 fL Final   MCH 02/16/2022 23.9 (L)  26.0 - 34.0 pg Final   MCHC 02/16/2022 30.2  30.0 - 36.0 g/dL Final   RDW 02/16/2022 17.0 (H)  11.5 - 15.5 % Final   Platelets 02/16/2022 387  150 - 400 K/uL Final   nRBC 02/16/2022 0.0  0.0 - 0.2 % Final   Neutrophils Relative % 02/16/2022 54  % Final   Neutro Abs 02/16/2022 3.2  1.7 - 7.7 K/uL Final   Lymphocytes Relative 02/16/2022 35  % Final   Lymphs Abs 02/16/2022 2.1  0.7 - 4.0  K/uL Final   Monocytes Relative 02/16/2022 6  % Final   Monocytes Absolute 02/16/2022 0.4  0.1 - 1.0 K/uL Final   Eosinophils Relative 02/16/2022 2  % Final   Eosinophils Absolute 02/16/2022 0.1  0.0 - 0.5 K/uL Final   Basophils Relative 02/16/2022 2  % Final   Basophils Absolute 02/16/2022 0.1  0.0 - 0.1 K/uL Final   Immature Granulocytes 02/16/2022 1  % Final   Abs Immature Granulocytes 02/16/2022 0.04  0.00 - 0.07 K/uL Final   Performed at [facility], [place] [address], [place], [place] [address]   Glucose-Capillary 02/16/2022 131 (H)  70 - 99 mg/dL Final   Glucose reference range applies only to samples taken after fasting for at least 8 hours.   Glucose-Capillary 02/16/2022 123 (H)  70 - 99 mg/dL Final   Glucose reference range applies only to samples taken after fasting for at least 8 hours.   Glucose-Capillary 02/16/2022 195 (H)  70 - 99 mg/dL Final   Glucose reference range applies only to samples taken after fasting for at least 8 hours.   Glucose-Capillary 02/17/2022 148 (H)  70 - 99 mg/dL Final   Glucose reference range applies only to samples taken after fasting for at least 8 hours.  Admission on 02/08/2022, Discharged on 02/09/2022  Component Date Value Ref Range Status   B Natriuretic Peptide 02/08/2022 40.2  0.0 - 100.0 pg/mL Final   Performed at [facility], [address], [place], [place] [address]   Troponin I (High Sensitivity) 02/08/2022 4  <18 ng/L Final   Comment: (NOTE) Elevated high sensitivity troponin I (hsTnI) values and significant  changes across serial measurements may suggest ACS but many other  chronic and acute conditions are known to elevate hsTnI results.  Refer to the "Links" section for chest pain algorithms and additional  guidance. Performed at [facility], [place] [address] [place], [place] [address]    WBC 02/08/2022 8.5  4.0 - 10.5 K/uL Final   RBC 02/08/2022 4.30  3.87 - 5.11 MIL/uL Final   Hemoglobin 02/08/2022 10.7 (L)   12.0 - 15.0 g/dL Final   HCT 02/08/2022 33.1 (L)  36.0 - 46.0 % Final   MCV 02/08/2022 77.0 (L)  80.0 - 100.0 fL Final   MCH 02/08/2022 24.9 (L)  26.0 - 34.0 pg Final   MCHC 02/08/2022 32.3  30.0 - 36.0 g/dL Final   RDW 02/08/2022 16.5 (H)  11.5 - 15.5 % Final   Platelets 02/08/2022 361  150 - 400  K/uL Final   nRBC 02/08/2022 0.0  0.0 - 0.2 % Final   Performed at [facility], [place] [address] [place], [place] [address]   Sodium 02/08/2022 131 (L)  135 - 145 mmol/L Final   Potassium 02/08/2022 4.4  3.5 - 5.1 mmol/L Final   Chloride 02/08/2022 96 (L)  98 - 111 mmol/L Final   CO2 02/08/2022 25  22 - 32 mmol/L Final   Glucose, Bld 02/08/2022 126 (H)  70 - 99 mg/dL Final   Glucose reference range applies only to samples taken after fasting for at least 8 hours.   BUN 02/08/2022 11  6 - 20 mg/dL Final   Creatinine, Ser 02/08/2022 0.52  0.44 - 1.00 mg/dL Final   Calcium 02/08/2022 9.4  8.9 - 10.3 mg/dL Final   Total Protein 02/08/2022 7.5  6.5 - 8.1 g/dL Final   Albumin 02/08/2022 3.6  3.5 - 5.0 g/dL Final   AST 02/08/2022 22  15 - 41 U/L Final   ALT 02/08/2022 20  0 - 44 U/L Final   Alkaline Phosphatase 02/08/2022 67  38 - 126 U/L Final   Total Bilirubin 02/08/2022 0.1 (L)  0.3 - 1.2 mg/dL Final   GFR, Estimated 02/08/2022 >60  >60 mL/min Final   Comment: (NOTE) Calculated using the CKD-EPI Creatinine Equation (2021)    Anion gap 02/08/2022 10  5 - 15 Final   Performed at [facility] [address] [place], [place] [address]   Troponin I (High Sensitivity) 02/08/2022 5  <18 ng/L Final   Comment: (NOTE) Elevated high sensitivity troponin I (hsTnI) values and significant  changes across serial measurements may suggest ACS but many other  chronic and acute conditions are known to elevate hsTnI results.  Refer to the "Links" section for chest pain algorithms and additional  guidance. Performed at [facility], [place] [address], [place], [place] [address]    WBC  02/09/2022 7.3  4.0 - 10.5 K/uL Final   RBC 02/09/2022 4.70  3.87 - 5.11 MIL/uL Final   Hemoglobin 02/09/2022 11.4 (L)  12.0 - 15.0 g/dL Final   HCT 02/09/2022 36.6  36.0 - 46.0 % Final   MCV 02/09/2022 77.9 (L)  80.0 - 100.0 fL Final   MCH 02/09/2022 24.3 (L)  26.0 - 34.0 pg Final   MCHC 02/09/2022 31.1  30.0 - 36.0 g/dL Final   RDW 02/09/2022 16.6 (H)  11.5 - 15.5 % Final   Platelets 02/09/2022 403 (H)  150 - 400 K/uL Final   nRBC 02/09/2022 0.0  0.0 - 0.2 % Final   Performed at [place] [address], [place], [place] [address]   Glucose-Capillary 02/08/2022 117 (H)  70 - 99 mg/dL Final   Glucose reference range applies only to samples taken after fasting for at least 8 hours.   Sodium 02/09/2022 138  135 - 145 mmol/L Final   Potassium 02/09/2022 3.7  3.5 - 5.1 mmol/L Final   Chloride 02/09/2022 103  98 - 111 mmol/L Final   CO2 02/09/2022 24  22 - 32 mmol/L Final   Glucose, Bld 02/09/2022 134 (H)  70 - 99 mg/dL Final   Glucose reference range applies only to samples taken after fasting for at least 8 hours.   BUN 02/09/2022 9  6 - 20 mg/dL Final   Creatinine, Ser 02/09/2022 0.44  0.44 - 1.00 mg/dL Final   Calcium 02/09/2022 8.3 (L)  8.9 - 10.3 mg/dL Final  GFR, Estimated 02/09/2022 >60  >60 mL/min Final   Comment: (NOTE) Calculated using the CKD-EPI Creatinine Equation (2021)    Anion gap 02/09/2022 11  5 - 15 Final   Performed at [facility], [place] [address], [place], [place] [address]   Iron 02/09/2022 20 (L)  28 - 170 ug/dL Final   TIBC 02/09/2022 455 (H)  250 - 450 ug/dL Final   Saturation Ratios 02/09/2022 4 (L)  10.4 - 31.8 % Final   UIBC 02/09/2022 435  ug/dL Final   Performed at [facility], [place] [address], [place], [place] [address]   Ferritin 02/09/2022 2 (L)  11 - 307 ng/mL Final   Performed at [facility] [address], [place], [place] [address]   Weight 02/09/2022 2,846.58  oz Final   Height 02/09/2022 62  in Final   BP  02/09/2022 143/80  mmHg Final   WBC 02/09/2022 8.1  4.0 - 10.5 K/uL Final   RBC 02/09/2022 4.38  3.87 - 5.11 MIL/uL Final   Hemoglobin 02/09/2022 10.9 (L)  12.0 - 15.0 g/dL Final   HCT 02/09/2022 33.3 (L)  36.0 - 46.0 % Final   MCV 02/09/2022 76.0 (L)  80.0 - 100.0 fL Final   MCH 02/09/2022 24.9 (L)  26.0 - 34.0 pg Final   MCHC 02/09/2022 32.7  30.0 - 36.0 g/dL Final   RDW 02/09/2022 16.5 (H)  11.5 - 15.5 % Final   Platelets 02/09/2022 397  150 - 400 K/uL Final   nRBC 02/09/2022 0.0  0.0 - 0.2 % Final   Neutrophils Relative % 02/09/2022 66  % Final   Neutro Abs 02/09/2022 5.4  1.7 - 7.7 K/uL Final   Lymphocytes Relative 02/09/2022 24  % Final   Lymphs Abs 02/09/2022 1.9  0.7 - 4.0 K/uL Final   Monocytes Relative 02/09/2022 8  % Final   Monocytes Absolute 02/09/2022 0.6  0.1 - 1.0 K/uL Final   Eosinophils Relative 02/09/2022 1  % Final   Eosinophils Absolute 02/09/2022 0.1  0.0 - 0.5 K/uL Final   Basophils Relative 02/09/2022 1  % Final   Basophils Absolute 02/09/2022 0.1  0.0 - 0.1 K/uL Final   Immature Granulocytes 02/09/2022 0  % Final   Abs Immature Granulocytes 02/09/2022 0.03  0.00 - 0.07 K/uL Final   Performed at [facility], [place] [address], [place], [place] [address]   Glucose-Capillary 02/09/2022 238 (H)  70 - 99 mg/dL Final   Glucose reference range applies only to samples taken after fasting for at least 8 hours.   Glucose-Capillary 02/09/2022 91  70 - 99 mg/dL Final   Glucose reference range applies only to samples taken after fasting for at least 8 hours.   CRP 02/09/2022 <0.5  <1.0 mg/dL Final   Performed at [facility] [address], [place], [place] [address]   Sed Rate 02/09/2022 15  0 - 22 mm/hr Final   Performed at [facility] [address], [place], [place] [address]   Glucose-Capillary 02/09/2022 137 (H)  70 - 99 mg/dL Final   Glucose reference range applies only to samples taken after fasting for at least 8 hours.  Admission on  01/24/2022, Discharged on 02/08/2022  Component Date Value Ref Range Status   Glucose-Capillary 01/24/2022 143 (H)  70 - 99 mg/dL Final   Glucose reference range applies only to samples taken after fasting for at least 8 hours.   Glucose-Capillary  01/24/2022 120 (H)  70 - 99 mg/dL Final   Glucose reference range applies only to samples taken after fasting for at least 8 hours.   Glucose-Capillary 01/25/2022 122 (H)  70 - 99 mg/dL Final   Glucose reference range applies only to samples taken after fasting for at least 8 hours.   Glucose-Capillary 01/25/2022 190 (H)  70 - 99 mg/dL Final   Glucose reference range applies only to samples taken after fasting for at least 8 hours.   Glucose-Capillary 01/25/2022 163 (H)  70 - 99 mg/dL Final   Glucose reference range applies only to samples taken after fasting for at least 8 hours.   WBC 01/26/2022 6.5  4.0 - 10.5 K/uL Final   RBC 01/26/2022 4.53  3.87 - 5.11 MIL/uL Final   Hemoglobin 01/26/2022 11.3 (L)  12.0 - 15.0 g/dL Final   HCT 01/26/2022 36.4  36.0 - 46.0 % Final   MCV 01/26/2022 80.4  80.0 - 100.0 fL Final   MCH 01/26/2022 24.9 (L)  26.0 - 34.0 pg Final   MCHC 01/26/2022 31.0  30.0 - 36.0 g/dL Final   RDW 01/26/2022 17.3 (H)  11.5 - 15.5 % Final   Platelets 01/26/2022 327  150 - 400 K/uL Final   nRBC 01/26/2022 0.0  0.0 - 0.2 % Final   Neutrophils Relative % 01/26/2022 60  % Final   Neutro Abs 01/26/2022 3.9  1.7 - 7.7 K/uL Final   Lymphocytes Relative 01/26/2022 31  % Final   Lymphs Abs 01/26/2022 2.0  0.7 - 4.0 K/uL Final   Monocytes Relative 01/26/2022 7  % Final   Monocytes Absolute 01/26/2022 0.5  0.1 - 1.0 K/uL Final   Eosinophils Relative 01/26/2022 1  % Final   Eosinophils Absolute 01/26/2022 0.1  0.0 - 0.5 K/uL Final   Basophils Relative 01/26/2022 1  % Final   Basophils Absolute 01/26/2022 0.1  0.0 - 0.1 K/uL Final   Immature Granulocytes 01/26/2022 0  % Final   Abs Immature Granulocytes 01/26/2022 0.02  0.00 - 0.07 K/uL  Final   Performed at [facility], [place] [address], [place], [place] [address]   Glucose-Capillary 01/26/2022 149 (H)  70 - 99 mg/dL Final   Glucose reference range applies only to samples taken after fasting for at least 8 hours.   Glucose-Capillary 01/26/2022 130 (H)  70 - 99 mg/dL Final   Glucose reference range applies only to samples taken after fasting for at least 8 hours.   Glucose-Capillary 01/26/2022 151 (H)  70 - 99 mg/dL Final   Glucose reference range applies only to samples taken after fasting for at least 8 hours.   Glucose-Capillary 01/26/2022 170 (H)  70 - 99 mg/dL Final   Glucose reference range applies only to samples taken after fasting for at least 8 hours.   Glucose-Capillary 01/27/2022 129 (H)  70 - 99 mg/dL Final   Glucose reference range applies only to samples taken after fasting for at least 8 hours.   Glucose-Capillary 01/27/2022 101 (H)  70 - 99 mg/dL Final   Glucose reference range applies only to samples taken after fasting for at least 8 hours.   Glucose-Capillary 01/27/2022 220 (H)  70 - 99 mg/dL Final   Glucose reference range applies only to samples taken after fasting for at least 8 hours.   Glucose-Capillary 01/27/2022 154 (H)  70 - 99 mg/dL Final   Glucose reference range applies only to samples taken after fasting for at least  8 hours.   Glucose-Capillary 01/27/2022 163 (H)  70 - 99 mg/dL Final   Glucose reference range applies only to samples taken after fasting for at least 8 hours.   Glucose-Capillary 01/28/2022 127 (H)  70 - 99 mg/dL Final   Glucose reference range applies only to samples taken after fasting for at least 8 hours.   Glucose-Capillary 01/28/2022 110 (H)  70 - 99 mg/dL Final   Glucose reference range applies only to samples taken after fasting for at least 8 hours.   Glucose-Capillary 01/28/2022 167 (H)  70 - 99 mg/dL Final   Glucose reference range applies only to samples taken after fasting for at least 8 hours.    Glucose-Capillary 01/29/2022 145 (H)  70 - 99 mg/dL Final   Glucose reference range applies only to samples taken after fasting for at least 8 hours.   Glucose-Capillary 01/29/2022 203 (H)  70 - 99 mg/dL Final   Glucose reference range applies only to samples taken after fasting for at least 8 hours.   Glucose-Capillary 01/29/2022 153 (H)  70 - 99 mg/dL Final   Glucose reference range applies only to samples taken after fasting for at least 8 hours.   Glucose-Capillary 01/29/2022 166 (H)  70 - 99 mg/dL Final   Glucose reference range applies only to samples taken after fasting for at least 8 hours.   Glucose-Capillary 01/30/2022 142 (H)  70 - 99 mg/dL Final   Glucose reference range applies only to samples taken after fasting for at least 8 hours.   Glucose-Capillary 01/30/2022 138 (H)  70 - 99 mg/dL Final   Glucose reference range applies only to samples taken after fasting for at least 8 hours.   Glucose-Capillary 01/30/2022 185 (H)  70 - 99 mg/dL Final   Glucose reference range applies only to samples taken after fasting for at least 8 hours.   Glucose-Capillary 01/30/2022 220 (H)  70 - 99 mg/dL Final   Glucose reference range applies only to samples taken after fasting for at least 8 hours.   Glucose-Capillary 01/31/2022 147 (H)  70 - 99 mg/dL Final   Glucose reference range applies only to samples taken after fasting for at least 8 hours.   Glucose-Capillary 01/31/2022 131 (H)  70 - 99 mg/dL Final   Glucose reference range applies only to samples taken after fasting for at least 8 hours.   Glucose-Capillary 01/31/2022 169 (H)  70 - 99 mg/dL Final   Glucose reference range applies only to samples taken after fasting for at least 8 hours.   Glucose-Capillary 01/31/2022 144 (H)  70 - 99 mg/dL Final   Glucose reference range applies only to samples taken after fasting for at least 8 hours.   Comment 1 01/31/2022 RN AWARE   Final   Glucose-Capillary 02/01/2022 117 (H)  70 - 99 mg/dL Final    Glucose reference range applies only to samples taken after fasting for at least 8 hours.   Glucose-Capillary 02/01/2022 180 (H)  70 - 99 mg/dL Final   Glucose reference range applies only to samples taken after fasting for at least 8 hours.   Glucose-Capillary 02/01/2022 209 (H)  70 - 99 mg/dL Final   Glucose reference range applies only to samples taken after fasting for at least 8 hours.   Glucose-Capillary 02/02/2022 131 (H)  70 - 99 mg/dL Final   Glucose reference range applies only to samples taken after fasting for at least 8 hours.   WBC 02/02/2022 7.5  4.0 - 10.5 K/uL Final   RBC 02/02/2022 4.52  3.87 - 5.11 MIL/uL Final   Hemoglobin 02/02/2022 11.3 (L)  12.0 - 15.0 g/dL Final   HCT 02/02/2022 35.0 (L)  36.0 - 46.0 % Final   MCV 02/02/2022 77.4 (L)  80.0 - 100.0 fL Final   MCH 02/02/2022 25.0 (L)  26.0 - 34.0 pg Final   MCHC 02/02/2022 32.3  30.0 - 36.0 g/dL Final   RDW 02/02/2022 16.7 (H)  11.5 - 15.5 % Final   Platelets 02/02/2022 345  150 - 400 K/uL Final   nRBC 02/02/2022 0.0  0.0 - 0.2 % Final   Neutrophils Relative % 02/02/2022 63  % Final   Neutro Abs 02/02/2022 4.7  1.7 - 7.7 K/uL Final   Lymphocytes Relative 02/02/2022 28  % Final   Lymphs Abs 02/02/2022 2.1  0.7 - 4.0 K/uL Final   Monocytes Relative 02/02/2022 7  % Final   Monocytes Absolute 02/02/2022 0.5  0.1 - 1.0 K/uL Final   Eosinophils Relative 02/02/2022 1  % Final   Eosinophils Absolute 02/02/2022 0.1  0.0 - 0.5 K/uL Final   Basophils Relative 02/02/2022 1  % Final   Basophils Absolute 02/02/2022 0.1  0.0 - 0.1 K/uL Final   Immature Granulocytes 02/02/2022 0  % Final   Abs Immature Granulocytes 02/02/2022 0.03  0.00 - 0.07 K/uL Final   Performed at [facility], [place] [address] [place], [place] [address]   Glucose-Capillary 02/02/2022 95  70 - 99 mg/dL Final   Glucose reference range applies only to samples taken after fasting for at least 8 hours.   Glucose-Capillary 02/02/2022 120 (H)  70 - 99 mg/dL  Final   Glucose reference range applies only to samples taken after fasting for at least 8 hours.   Glucose-Capillary 02/02/2022 191 (H)  70 - 99 mg/dL Final   Glucose reference range applies only to samples taken after fasting for at least 8 hours.   Glucose-Capillary 02/03/2022 148 (H)  70 - 99 mg/dL Final   Glucose reference range applies only to samples taken after fasting for at least 8 hours.   Glucose-Capillary 02/03/2022 122 (H)  70 - 99 mg/dL Final   Glucose reference range applies only to samples taken after fasting for at least 8 hours.   Glucose-Capillary 02/03/2022 233 (H)  70 - 99 mg/dL Final   Glucose reference range applies only to samples taken after fasting for at least 8 hours.   Glucose-Capillary 02/04/2022 126 (H)  70 - 99 mg/dL Final   Glucose reference range applies only to samples taken after fasting for at least 8 hours.   Glucose-Capillary 02/04/2022 117 (H)  70 - 99 mg/dL Final   Glucose reference range applies only to samples taken after fasting for at least 8 hours.   Glucose-Capillary 02/04/2022 135 (H)  70 - 99 mg/dL Final   Glucose reference range applies only to samples taken after fasting for at least 8 hours.   Glucose-Capillary 02/04/2022 197 (H)  70 - 99 mg/dL Final   Glucose reference range applies only to samples taken after fasting for at least 8 hours.   Glucose-Capillary 02/05/2022 170 (H)  70 - 99 mg/dL Final   Glucose reference range applies only to samples taken after fasting for at least 8 hours.   Glucose-Capillary 02/05/2022 107 (H)  70 - 99 mg/dL Final   Glucose reference range applies only to samples taken after fasting for at least 8 hours.  Glucose-Capillary 02/05/2022 184 (H)  70 - 99 mg/dL Final   Glucose reference range applies only to samples taken after fasting for at least 8 hours.   Glucose-Capillary 02/05/2022 256 (H)  70 - 99 mg/dL Final   Glucose reference range applies only to samples taken after fasting for at least 8 hours.    Glucose-Capillary 02/06/2022 144 (H)  70 - 99 mg/dL Final   Glucose reference range applies only to samples taken after fasting for at least 8 hours.   Glucose-Capillary 02/06/2022 190 (H)  70 - 99 mg/dL Final   Glucose reference range applies only to samples taken after fasting for at least 8 hours.   Glucose-Capillary 02/06/2022 92  70 - 99 mg/dL Final   Glucose reference range applies only to samples taken after fasting for at least 8 hours.   Trichomonas 02/06/2022 Negative   Final   Bacterial Vaginitis-Urine 02/06/2022 Negative   Final   Molecular Comment 02/06/2022 For tests bacteria and/or candida, this specimen does not meet the   Final   Molecular Comment 02/06/2022 strict criteria set by the FDA. The result interpretation should be   Final   Molecular Comment 02/06/2022 considered in conjunction with the patient's clinical history.   Final   Comment 02/06/2022 Normal Reference Range Trichomonas - Negative   Final   Glucose-Capillary 02/06/2022 197 (H)  70 - 99 mg/dL Final   Glucose reference range applies only to samples taken after fasting for at least 8 hours.   Glucose-Capillary 02/07/2022 129 (H)  70 - 99 mg/dL Final   Glucose reference range applies only to samples taken after fasting for at least 8 hours.   Glucose-Capillary 02/07/2022 207 (H)  70 - 99 mg/dL Final   Glucose reference range applies only to samples taken after fasting for at least 8 hours.   Glucose-Capillary 02/07/2022 153 (H)  70 - 99 mg/dL Final   Glucose reference range applies only to samples taken after fasting for at least 8 hours.   Glucose-Capillary 02/08/2022 129 (H)  70 - 99 mg/dL Final   Glucose reference range applies only to samples taken after fasting for at least 8 hours.   Glucose-Capillary 02/08/2022 107 (H)  70 - 99 mg/dL Final   Glucose reference range applies only to samples taken after fasting for at least 8 hours.  Admission on 01/23/2022, Discharged on 01/24/2022  Component Date Value  Ref Range Status   Sodium 01/23/2022 140  135 - 145 mmol/L Final   Potassium 01/23/2022 4.4  3.5 - 5.1 mmol/L Final   Chloride 01/23/2022 101  98 - 111 mmol/L Final   CO2 01/23/2022 25  22 - 32 mmol/L Final   Glucose, Bld 01/23/2022 198 (H)  70 - 99 mg/dL Final   Glucose reference range applies only to samples taken after fasting for at least 8 hours.   BUN 01/23/2022 13  6 - 20 mg/dL Final   Creatinine, Ser 01/23/2022 0.62  0.44 - 1.00 mg/dL Final   Calcium 01/23/2022 9.3  8.9 - 10.3 mg/dL Final   GFR, Estimated 01/23/2022 >60  >60 mL/min Final   Comment: (NOTE) Calculated using the CKD-EPI Creatinine Equation (2021)    Anion gap 01/23/2022 14  5 - 15 Final   Performed at [facility], [place] [address], [place], [place] [address]   WBC 01/23/2022 5.8  4.0 - 10.5 K/uL Final   RBC 01/23/2022 4.63  3.87 - 5.11 MIL/uL Final   Hemoglobin 01/23/2022 11.7 (L)  12.0 - 15.0 g/dL Final   HCT 01/23/2022 37.4  36.0 - 46.0 % Final   MCV 01/23/2022 80.8  80.0 - 100.0 fL Final   MCH 01/23/2022 25.3 (L)  26.0 - 34.0 pg Final   MCHC 01/23/2022 31.3  30.0 - 36.0 g/dL Final   RDW 01/23/2022 17.9 (H)  11.5 - 15.5 % Final   Platelets 01/23/2022 333  150 - 400 K/uL Final   nRBC 01/23/2022 0.0  0.0 - 0.2 % Final   Performed at [facility], [place] [address] [place], [place] [address]   Troponin I (High Sensitivity) 01/23/2022 6  <18 ng/L Final   Comment: (NOTE) Elevated high sensitivity troponin I (hsTnI) values and significant  changes across serial measurements may suggest ACS but many other  chronic and acute conditions are known to elevate hsTnI results.  Refer to the "Links" section for chest pain algorithms and additional  guidance. Performed at [facility], [place] [address], [place], [place] [address]    Troponin I (High Sensitivity) 01/23/2022 5  <18 ng/L Final   Comment: (NOTE) Elevated high sensitivity troponin I (hsTnI) values and significant  changes across serial  measurements may suggest ACS but many other  chronic and acute conditions are known to elevate hsTnI results.  Refer to the "Links" section for chest pain algorithms and additional  guidance. Performed at [facility], [place] [address] [place], [place] [address]    SARS Coronavirus 2 by RT PCR 01/23/2022 NEGATIVE  NEGATIVE Final   Comment: (NOTE) SARS-CoV-2 target nucleic acids are NOT DETECTED.  The SARS-CoV-2 RNA is generally detectable in upper and lower respiratory specimens during the acute phase of infection. The lowest concentration of SARS-CoV-2 viral copies this assay can detect is 250 copies / mL. A negative result does not preclude SARS-CoV-2 infection and should not be used as the sole basis for treatment or other patient management decisions.  A negative result may occur with improper specimen collection / handling, submission of specimen other than nasopharyngeal swab, presence of viral mutation(s) within the areas targeted by this assay, and inadequate number of viral copies (<250 copies / mL). A negative result must be combined with clinical observations, patient history, and epidemiological information.  Fact Sheet for Patients:   https://www.patel.info/  Fact Sheet for Healthcare Providers: https://hall.com/  This test is not yet approved or                           cleared by the [place] FDA and has been authorized for detection and/or diagnosis of SARS-CoV-2 by FDA under an Emergency Use Authorization (EUA).  This EUA will remain in effect (meaning this test can be used) for the duration of the COVID-19 declaration under Section 564(b)(1) of the Act, 21 U.S.C. section 360bbb-3(b)(1), unless the authorization is terminated or revoked sooner.  Performed at [facility], [place] [address], [place], [place] [address]    B Natriuretic Peptide 01/23/2022 41.3  0.0 - 100.0 pg/mL Final   Performed at [facility] [address] [place], [place] [address]   Group A Strep by PCR 01/23/2022 NOT DETECTED  NOT DETECTED Final   Performed at [facility], [place] [address] [place], [place] [address]   Color, Urine 01/23/2022 YELLOW  YELLOW Final   APPearance 01/23/2022 CLEAR  CLEAR Final   Specific Gravity, Urine 01/23/2022 1.018  1.005 - 1.030 Final  pH 01/23/2022 7.0  5.0 - 8.0 Final   Glucose, UA 01/23/2022 NEGATIVE  NEGATIVE mg/dL Final   Hgb urine dipstick 01/23/2022 NEGATIVE  NEGATIVE Final   Bilirubin Urine 01/23/2022 NEGATIVE  NEGATIVE Final   Ketones, ur 01/23/2022 NEGATIVE  NEGATIVE mg/dL Final   Protein, ur 01/23/2022 NEGATIVE  NEGATIVE mg/dL Final   Nitrite 01/23/2022 NEGATIVE  NEGATIVE Final   Leukocytes,Ua 01/23/2022 TRACE (A)  NEGATIVE Final   RBC / HPF 01/23/2022 0-5  0 - 5 RBC/hpf Final   WBC, UA 01/23/2022 0-5  0 - 5 WBC/hpf Final   Bacteria, UA 01/23/2022 NONE SEEN  NONE SEEN Final   Squamous Epithelial / LPF 01/23/2022 0-5  0 - 5 Final   Mucus 01/23/2022 PRESENT   Final   Performed at [facility], [address], [place], [place] [address]   SARS Coronavirus 2 by RT PCR 01/23/2022 NEGATIVE  NEGATIVE Final   Comment: (NOTE) SARS-CoV-2 target nucleic acids are NOT DETECTED.  The SARS-CoV-2 RNA is generally detectable in upper respiratory specimens during the acute phase of infection. The lowest concentration of SARS-CoV-2 viral copies this assay can detect is 138 copies/mL. A negative result does not preclude SARS-Cov-2 infection and should not be used as the sole basis for treatment or other patient management decisions. A negative result may occur with  improper specimen collection/handling, submission of specimen other than nasopharyngeal swab, presence of viral mutation(s) within the areas targeted by this assay, and inadequate number of viral copies(<138 copies/mL). A negative result must be combined with clinical observations, patient history, and  epidemiological information. The expected result is Negative.  Fact Sheet for Patients:  EntrepreneurPulse.com.au  Fact Sheet for Healthcare Providers:  IncredibleEmployment.be  This test is no                          t yet approved or cleared by the [place] FDA and  has been authorized for detection and/or diagnosis of SARS-CoV-2 by FDA under an Emergency Use Authorization (EUA). This EUA will remain  in effect (meaning this test can be used) for the duration of the COVID-19 declaration under Section 564(b)(1) of the Act, 21 U.S.C.section 360bbb-3(b)(1), unless the authorization is terminated  or revoked sooner.       Influenza A by PCR 01/23/2022 NEGATIVE  NEGATIVE Final   Influenza B by PCR 01/23/2022 NEGATIVE  NEGATIVE Final   Comment: (NOTE) The Xpert Xpress SARS-CoV-2/FLU/RSV plus assay is intended as an aid in the diagnosis of influenza from Nasopharyngeal swab specimens and should not be used as a sole basis for treatment. Nasal washings and aspirates are unacceptable for Xpert Xpress SARS-CoV-2/FLU/RSV testing.  Fact Sheet for Patients: EntrepreneurPulse.com.au  Fact Sheet for Healthcare Providers: IncredibleEmployment.be  This test is not yet approved or cleared by the [place] FDA and has been authorized for detection and/or diagnosis of SARS-CoV-2 by FDA under an Emergency Use Authorization (EUA). This EUA will remain in effect (meaning this test can be used) for the duration of the COVID-19 declaration under Section 564(b)(1) of the Act, 21 U.S.C. section 360bbb-3(b)(1), unless the authorization is terminated or revoked.  Performed at [facility], [place] [address] [place], [place] [address]    WBC 01/24/2022 6.1  4.0 - 10.5 K/uL Final   RBC 01/24/2022 4.60  3.87 - 5.11 MIL/uL Final   Hemoglobin 01/24/2022 11.7 (L)  12.0 - 15.0 g/dL Final   HCT 01/24/2022 37.0  36.0 -  46.0 % Final   MCV 01/24/2022 80.4  80.0 - 100.0 fL Final   MCH 01/24/2022 25.4 (L)  26.0 - 34.0 pg Final   MCHC 01/24/2022 31.6  30.0 - 36.0 g/dL Final   RDW 01/24/2022 17.6 (H)  11.5 - 15.5 % Final   Platelets 01/24/2022 334  150 - 400 K/uL Final   nRBC 01/24/2022 0.0  0.0 - 0.2 % Final   Neutrophils Relative % 01/24/2022 53  % Final   Neutro Abs 01/24/2022 3.3  1.7 - 7.7 K/uL Final   Lymphocytes Relative 01/24/2022 33  % Final   Lymphs Abs 01/24/2022 2.0  0.7 - 4.0 K/uL Final   Monocytes Relative 01/24/2022 11  % Final   Monocytes Absolute 01/24/2022 0.7  0.1 - 1.0 K/uL Final   Eosinophils Relative 01/24/2022 2  % Final   Eosinophils Absolute 01/24/2022 0.1  0.0 - 0.5 K/uL Final   Basophils Relative 01/24/2022 1  % Final   Basophils Absolute 01/24/2022 0.1  0.0 - 0.1 K/uL Final   Immature Granulocytes 01/24/2022 0  % Final   Abs Immature Granulocytes 01/24/2022 0.02  0.00 - 0.07 K/uL Final   Performed at [facility], [place] [address] [place], [place] [address]   Glucose-Capillary 01/24/2022 110 (H)  70 - 99 mg/dL Final   Glucose reference range applies only to samples taken after fasting for at least 8 hours.  No results displayed because visit has over 200 results.    Admission on 11/22/2021, Discharged on 11/22/2021  Component Date Value Ref Range Status   Glucose-Capillary 11/22/2021 169 (H)  70 - 99 mg/dL Final   Glucose reference range applies only to samples taken after fasting for at least 8 hours.  No results displayed because visit has over 200 results.    Admission on 10/04/2021, Discharged on 10/22/2021  Component Date Value Ref Range Status   SARS Coronavirus 2 by RT PCR 10/04/2021 NEGATIVE  NEGATIVE Final   Comment: (NOTE) SARS-CoV-2 target nucleic acids are NOT DETECTED.  The SARS-CoV-2 RNA is generally detectable in upper respiratory specimens during the acute phase of infection. The lowest concentration of SARS-CoV-2 viral copies this assay can detect  is 138 copies/mL. A negative result does not preclude SARS-Cov-2 infection and should not be used as the sole basis for treatment or other patient management decisions. A negative result may occur with  improper specimen collection/handling, submission of specimen other than nasopharyngeal swab, presence of viral mutation(s) within the areas targeted by this assay, and inadequate number of viral copies(<138 copies/mL). A negative result must be combined with clinical observations, patient history, and epidemiological information. The expected result is Negative.  Fact Sheet for Patients:  EntrepreneurPulse.com.au  Fact Sheet for Healthcare Providers:  IncredibleEmployment.be  This test is no                          t yet approved or cleared by the [place] FDA and  has been authorized for detection and/or diagnosis of SARS-CoV-2 by FDA under an Emergency Use Authorization (EUA). This EUA will remain  in effect (meaning this test can be used) for the duration of the COVID-19 declaration under Section 564(b)(1) of the Act, 21 U.S.C.section 360bbb-3(b)(1), unless the authorization is terminated  or revoked sooner.       Influenza A by PCR 10/04/2021 NEGATIVE  NEGATIVE Final   Influenza B by PCR 10/04/2021 NEGATIVE  NEGATIVE Final  Comment: (NOTE) The Xpert Xpress SARS-CoV-2/FLU/RSV plus assay is intended as an aid in the diagnosis of influenza from Nasopharyngeal swab specimens and should not be used as a sole basis for treatment. Nasal washings and aspirates are unacceptable for Xpert Xpress SARS-CoV-2/FLU/RSV testing.  Fact Sheet for Patients: EntrepreneurPulse.com.au  Fact Sheet for Healthcare Providers: IncredibleEmployment.be  This test is not yet approved or cleared by the [place] FDA and has been authorized for detection and/or diagnosis of SARS-CoV-2 by FDA under an Emergency Use  Authorization (EUA). This EUA will remain in effect (meaning this test can be used) for the duration of the COVID-19 declaration under Section 564(b)(1) of the Act, 21 U.S.C. section 360bbb-3(b)(1), unless the authorization is terminated or revoked.  Performed at [facility], [place] [address], [place], [place] [address]    WBC 10/04/2021 8.4  4.0 - 10.5 K/uL Final   RBC 10/04/2021 4.42  3.87 - 5.11 MIL/uL Final   Hemoglobin 10/04/2021 11.6 (L)  12.0 - 15.0 g/dL Final   HCT 10/04/2021 35.7 (L)  36.0 - 46.0 % Final   MCV 10/04/2021 80.8  80.0 - 100.0 fL Final   MCH 10/04/2021 26.2  26.0 - 34.0 pg Final   MCHC 10/04/2021 32.5  30.0 - 36.0 g/dL Final   RDW 10/04/2021 16.0 (H)  11.5 - 15.5 % Final   Platelets 10/04/2021 372  150 - 400 K/uL Final   nRBC 10/04/2021 0.0  0.0 - 0.2 % Final   Neutrophils Relative % 10/04/2021 68  % Final   Neutro Abs 10/04/2021 5.7  1.7 - 7.7 K/uL Final   Lymphocytes Relative 10/04/2021 22  % Final   Lymphs Abs 10/04/2021 1.8  0.7 - 4.0 K/uL Final   Monocytes Relative 10/04/2021 8  % Final   Monocytes Absolute 10/04/2021 0.7  0.1 - 1.0 K/uL Final   Eosinophils Relative 10/04/2021 1  % Final   Eosinophils Absolute 10/04/2021 0.1  0.0 - 0.5 K/uL Final   Basophils Relative 10/04/2021 1  % Final   Basophils Absolute 10/04/2021 0.1  0.0 - 0.1 K/uL Final   Immature Granulocytes 10/04/2021 0  % Final   Abs Immature Granulocytes 10/04/2021 0.03  0.00 - 0.07 K/uL Final   Performed at [facility], [place] [address] [place], [place] [address]   Sodium 10/04/2021 136  135 - 145 mmol/L Final   Potassium 10/04/2021 4.2  3.5 - 5.1 mmol/L Final   Chloride 10/04/2021 104  98 - 111 mmol/L Final   CO2 10/04/2021 25  22 - 32 mmol/L Final   Glucose, Bld 10/04/2021 100 (H)  70 - 99 mg/dL Final   Glucose reference range applies only to samples taken after fasting for at least 8 hours.   BUN 10/04/2021 9  6 - 20 mg/dL Final   Creatinine, Ser 10/04/2021 0.52  0.44 -  1.00 mg/dL Final   Calcium 10/04/2021 9.0  8.9 - 10.3 mg/dL Final   Total Protein 10/04/2021 7.0  6.5 - 8.1 g/dL Final   Albumin 10/04/2021 3.2 (L)  3.5 - 5.0 g/dL Final   AST 10/04/2021 13 (L)  15 - 41 U/L Final   ALT 10/04/2021 10  0 - 44 U/L Final   Alkaline Phosphatase 10/04/2021 61  38 - 126 U/L Final   Total Bilirubin 10/04/2021 0.3  0.3 - 1.2 mg/dL Final   GFR, Estimated 10/04/2021 >60  >60 mL/min Final   Comment: (NOTE) Calculated using the CKD-EPI Creatinine Equation (2021)  Anion gap 10/04/2021 7  5 - 15 Final   Performed at [facility] [address] [place], [place] [address]   Hgb A1c MFr Bld 10/04/2021 7.0 (H)  4.8 - 5.6 % Final   Comment: (NOTE) Pre diabetes:          5.7%-6.4%  Diabetes:              >6.4%  Glycemic control for   <7.0% adults with diabetes    Mean Plasma Glucose 10/04/2021 154.2  mg/dL Final   Performed at [facility], [place] [address], [place], [place] [address]   Cholesterol 10/04/2021 178  0 - 200 mg/dL Final   Triglycerides 10/04/2021 155 (H)  <150 mg/dL Final   HDL 10/04/2021 52  >40 mg/dL Final   Total CHOL/HDL Ratio 10/04/2021 3.4  RATIO Final   VLDL 10/04/2021 31  0 - 40 mg/dL Final   LDL Cholesterol 10/04/2021 95  0 - 99 mg/dL Final   Comment:        Total Cholesterol/HDL:CHD Risk Coronary Heart Disease Risk Table                     Men   Women  1/2 Average Risk   3.4   3.3  Average Risk       5.0   4.4  2 X Average Risk   9.6   7.1  3 X Average Risk  23.4   11.0        Use the calculated Patient Ratio above and the CHD Risk Table to determine the patient's CHD Risk.        ATP III CLASSIFICATION (LDL):  <100     mg/dL   Optimal  100-129  mg/dL   Near or Above                    Optimal  130-159  mg/dL   Borderline  160-189  mg/dL   High  >190     mg/dL   Very High Performed at [facility] [address] [place], [place] [address]    POC Amphetamine UR 10/04/2021 None Detected  NONE DETECTED (Cut  Off Level 1000 ng/mL) Final   POC Secobarbital (BAR) 10/04/2021 None Detected  NONE DETECTED (Cut Off Level 300 ng/mL) Final   POC Buprenorphine (BUP) 10/04/2021 None Detected  NONE DETECTED (Cut Off Level 10 ng/mL) Final   POC Oxazepam (BZO) 10/04/2021 None Detected  NONE DETECTED (Cut Off Level 300 ng/mL) Final   POC Cocaine UR 10/04/2021 None Detected  NONE DETECTED (Cut Off Level 300 ng/mL) Final   POC Methamphetamine UR 10/04/2021 None Detected  NONE DETECTED (Cut Off Level 1000 ng/mL) Final   POC Morphine 10/04/2021 None Detected  NONE DETECTED (Cut Off Level 300 ng/mL) Final   POC Methadone UR 10/04/2021 None Detected  NONE DETECTED (Cut Off Level 300 ng/mL) Final   POC Oxycodone UR 10/04/2021 Positive (A)  NONE DETECTED (Cut Off Level 100 ng/mL) Final   POC Marijuana UR 10/04/2021 None Detected  NONE DETECTED (Cut Off Level 50 ng/mL) Final   SARSCOV2ONAVIRUS 2 AG 10/04/2021 NEGATIVE  NEGATIVE Final   Comment: (NOTE) SARS-CoV-2 antigen NOT DETECTED.   Negative results are presumptive.  Negative results do not preclude SARS-CoV-2 infection and should not be used as the sole basis for treatment or other patient management decisions, including infection  control decisions, particularly in the presence of clinical signs and  symptoms consistent with COVID-19, or in those who have been in contact with the virus.  Negative results must be combined with clinical observations, patient history, and epidemiological information. The expected result is Negative.  Fact Sheet for Patients: HandmadeRecipes.com.cy  Fact Sheet for Healthcare Providers: FuneralLife.at  This test is not yet approved or cleared by the [place] FDA and  has been authorized for detection and/or diagnosis of SARS-CoV-2 by FDA under an Emergency Use Authorization (EUA).  This EUA will remain in effect (meaning this test can be used) for the duration of  the COV                           ID-19 declaration under Section 564(b)(1) of the Act, 21 U.S.C. section 360bbb-3(b)(1), unless the authorization is terminated or revoked sooner.     Valproic Acid Lvl 10/04/2021 51  50.0 - 100.0 ug/mL Final   Performed at [facility] [address] [place], [place] [address]   Valproic Acid Lvl 10/08/2021 57  50.0 - 100.0 ug/mL Final   Performed at [facility] [address], [place], [place] [address]   Glucose-Capillary 10/09/2021 123 (H)  70 - 99 mg/dL Final   Glucose reference range applies only to samples taken after fasting for at least 8 hours.  There may be more visits with results that are not included.    Blood Alcohol level:  Lab Results  Component Value Date   ETH <10 11/23/2021   ETH <10 39/04/90    Metabolic Disorder Labs: Lab Results  Component Value Date   HGBA1C 6.7 (H) 12/28/2021   MPG 145.59 12/28/2021   MPG 154.2 10/04/2021   Lab Results  Component Value Date   PROLACTIN 3.8 (L) 11/23/2021   Lab Results  Component Value Date   CHOL 171 11/23/2021   TRIG 125 11/23/2021   HDL 65 11/23/2021   CHOLHDL 2.6 11/23/2021   VLDL 25 11/23/2021   LDLCALC 81 11/23/2021   [place] 95 10/04/2021    Therapeutic Lab Levels: No results found for: "LITHIUM" Lab Results  Component Value Date   VALPROATE 17 (L) 02/17/2022   VALPROATE 33 (L) 01/18/2022   No results found for: "CBMZ"  Physical Findings   PHQ2-9    Flowsheet Row ED from 11/23/2021 in [facility]  PHQ-2 Total Score 2  PHQ-9 Total Score 3      Flowsheet Row ED from 02/18/2022 in [facility] ED from 02/17/2022 in [facility] ED from 02/09/2022 in [facility] No Risk No Risk No Risk        Musculoskeletal  Strength & Muscle Tone: within normal limits Gait & Station: normal Patient leans: N/A  Psychiatric  Specialty Exam  Presentation  General Appearance:  Disheveled; Casual  Eye Contact: Fair  Speech: Clear and Coherent; Normal Rate  Speech Volume: Normal  Handedness: Right   Mood and Affect  Mood: Euthymic  Affect: Congruent   Thought Process  Thought Processes: Coherent  Descriptions of Associations:Intact  Orientation:Full (Time, Place and Person)  Thought Content:Logical  Diagnosis of Schizophrenia or Schizoaffective disorder in past: Yes  Duration of Psychotic Symptoms: No data recorded  Hallucinations:Hallucinations: None  Ideas of Reference:None  Suicidal Thoughts:Suicidal Thoughts: No  Homicidal Thoughts:Homicidal Thoughts: No   Sensorium  Memory: Immediate Good; Recent Good; Remote Good  Judgment: Fair  Insight: Fair   Materials engineer: Fair  Attention Span: Fair  Recall: [employer] of Knowledge: Fair  Language: Fair   Psychomotor Activity  Psychomotor Activity: Psychomotor Activity: Normal   Assets  Assets: Physical Health; Resilience; Social Support   Sleep  Sleep: Sleep: Good   No data recorded  Physical Exam  Physical Exam Vitals and nursing note reviewed.  Constitutional:      General: She is not in acute distress.    Appearance: Normal appearance. She is not ill-appearing.  HENT:     Head: Normocephalic.  Eyes:     General:        Right eye: No discharge.        Left eye: No discharge.     Conjunctiva/sclera: Conjunctivae normal.  Cardiovascular:     Rate and Rhythm: Normal rate.  Pulmonary:     Effort: Pulmonary effort is normal. No respiratory distress.  Musculoskeletal:        General: Normal range of motion.     Cervical back: Normal range of motion.  Skin:    Coloration: Skin is not jaundiced or pale.  Neurological:     Mental Status: She is alert and oriented to person, place, and time.  Psychiatric:        Attention and Perception: Attention and perception normal.         Mood and Affect: Mood and affect normal.        Speech: Speech normal.        Behavior: Behavior normal. Behavior is cooperative.        Thought Content: Thought content normal.        Cognition and Memory: Cognition normal.        Judgment: Judgment normal.    Review of Systems  Constitutional: Negative.   HENT: Negative.    Eyes: Negative.   Respiratory: Negative.  Negative for cough and shortness of breath.   Cardiovascular: Negative.  Negative for chest pain.  Musculoskeletal: Negative.   Skin: Negative.   Neurological: Negative.   Psychiatric/Behavioral: Negative.     Blood pressure (!) 127/91, pulse 100, temperature 97.6 F (36.4 C), temperature source Oral, resp. rate 18, SpO2 100 %. There is no height or weight on file to calculate BMI.  Treatment Plan Summary: Pt remains psychiatrically cleared. Disposition is pending placement.   Revonda Humphrey, NP 02/20/2022 10:47 AM

## 2022-02-20 NOTE — ED Notes (Signed)
Pt sleeping@this time. Breathing even and unlabored. Will continue to monitor for safety 

## 2022-02-20 NOTE — ED Notes (Signed)
Pt is sleeping. No distress noted. Will continue to monitor safety. 

## 2022-02-20 NOTE — ED Notes (Signed)
Pt resting on bed listening to music on television. Pt calm and cooperative. No c/o pain or distress. Will continue to monitor for safety

## 2022-02-20 NOTE — ED Notes (Signed)
Pt is in the bed resting. Respirations are even and unlabored. No acute distress noted. Will continue to monitor for safety 

## 2022-02-20 NOTE — ED Notes (Signed)
Patient denies SI/HI and AVH. Patient ate her breakfast and took her medications. Patient is being monitored for safety.

## 2022-02-21 DIAGNOSIS — F209 Schizophrenia, unspecified: Secondary | ICD-10-CM | POA: Diagnosis not present

## 2022-02-21 LAB — GLUCOSE, CAPILLARY
Glucose-Capillary: 151 mg/dL — ABNORMAL HIGH (ref 70–99)
Glucose-Capillary: 106 mg/dL — ABNORMAL HIGH (ref 70–99)
Glucose-Capillary: 149 mg/dL — ABNORMAL HIGH (ref 70–99)

## 2022-02-21 MED ORDER — MENTHOL 3 MG MT LOZG
1.0000 | LOZENGE | OROMUCOSAL | Status: AC | PRN
  Administered 2022-02-22 – 2022-02-26 (×4): 3 mg via ORAL
  Filled 2022-02-21 (×3): qty 9

## 2022-02-21 NOTE — ED Notes (Signed)
Patient alert and oriented x 3. Denies SI/HI/AVH. Denies intent or plan to harm self or others. Routine conducted according to faculty protocol. Encourage patient to notify staff with any needs or concerns. Patient verbalized agreement and understanding. Will continue to monitor for safety. 

## 2022-02-21 NOTE — ED Notes (Signed)
Patient  sleeping in no acute stress. RR even and unlabored .Environment secured .Will continue to monitor for safely. 

## 2022-02-21 NOTE — ED Notes (Addendum)
Patient went outside with Rn.Will continue to monitor for safely.

## 2022-02-21 NOTE — ED Notes (Signed)
Pt calm and cooperative. Alert and orient x 4 . Will continue  to monitor for safety

## 2022-02-21 NOTE — ED Notes (Signed)
Pt sleeping@this time. Breathing even and unlabored. Will continue to monitor for safety 

## 2022-02-21 NOTE — ED Provider Notes (Signed)
Behavioral Health Progress Note  Date and Time: 02/21/2022 5:11 PM Name: Teresa Murillo MRN:  [id number]  Subjective:  Subjective:  Teresa Murillo is a 61 y.o. female with a past psychiatric history of schizophrenia, aggressive behavior, and possible intellectual disability presenting to [facility] on 11/23/21 voluntarily as a walk in via [facility] with complaints that she was locked out of the group home. Patient has been dismissed from group home due to multiple elopements. Pt had already been discharged from this facility but had been living there temporarily while DSS was looking for new placement. Pt is boarding at [facility].    Patient assessed face to face by this provider and chart reviewed on 02/21/2022.   On today's assessment patient is observed sitting in her bed awake.  She is pleasant upon approach.  He has normal speech and behavior.  She continues to deny SI/HI/AVH.  She is able to converse coherentl without any internal preoccupation or distractibility.  She denies any concerns with appetite or sleep.  She wishes that she could have some good food for Christmas.  She asked about her placement.  Explained that TOC/DSS is working on it.  Provided reassurance and support.  She has continued to remain cooperative and appropriate with staff and other patients.  Patient remains psychiatrically cleared.      AID to Capacity Evaluation (ACE) completed by Teresa Murillo, Teresa Murillo on 12/11/2021.  Please see media tab for full details.    Teresa Harman, PhD completed: Wechsler Adult Intelligence Scale-4, Teresa Murillo achieved a full-scale IQ score of 73 and a percentile rank of 4 placing her in the borderline range of intellectual functioning (12/14/2021) Please see consult note from Teresa Harman, PhD on 12/14/2021.  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 On the Wechsler Adult Intelligence Scale-4, Teresa Murillo achieved a full-scale IQ score of 73 and a percentile rank of 4 placing her in the borderline range of intellectual functioning.    Chronic obstructive pulmonary disease (COPD) (HCC)    Glaucoma    Hyperlipidemia    Hypertension    Iron deficiency    Schizoaffective disorder (HCC)    Type 2 diabetes mellitus (HCC)     Past Surgical History:  Procedure Laterality Date   TUBAL LIGATION     Family History:  Family History  Problem Relation Age of Onset   Breast cancer Maternal Grandmother    Family Psychiatric  History: see h&p Social History:  Social History   Substance and Sexual Activity  Alcohol Use Not Currently     Social History   Substance and Sexual Activity  Drug Use Not Currently    Social History   Socioeconomic History   Marital status: Divorced    Spouse name: Not on file   Number of children: Not on file   Years of education: Not on file   Highest education level: Not on file  Occupational History   Not on file  Tobacco Use   Smoking status: Former    Packs/day: 1.00    Types: Cigars, Cigarettes    Quit date: 11/09/2021    Years since quitting: 0.2   Smokeless tobacco: Current  Vaping Use   Vaping Use: Never used  Substance and Sexual Activity  Alcohol use: Not Currently   Drug use: Not Currently   Sexual activity: Not Currently    Birth control/protection: Surgical  Other Topics Concern   Not on file  Social History Narrative   Not on file   Social Determinants of Health   Financial Resource Strain: Not on file  Food Insecurity: Not on file  Transportation Needs: Not on file  Physical Activity: Not on file  Stress: Not on file  Social Connections: Not on file   SDOH:  SDOH Screenings   Depression (PHQ2-9): Low Risk   (11/23/2021)  Tobacco Use: High Risk (02/17/2022)   Additional Social History:                         Sleep: Good  Appetite:  Good  Current Medications:  Current Facility-Administered Medications  Medication Dose Route Frequency Provider Last Rate Last Admin   acetaminophen (TYLENOL) tablet 650 mg  650 mg Oral Q6H PRN [name], NP   650 mg at 02/20/22 2115   albuterol (VENTOLIN HFA) 108 (90 Base) MCG/ACT inhaler 2 puff  2 puff Inhalation Q6H PRN [name], NP   2 puff at 02/20/22 1944   alum & mag hydroxide-simeth (MAALOX/MYLANTA) 200-200-20 MG/5ML suspension 30 mL  30 mL Oral Q4H PRN [name], NP   30 mL at 02/19/22 1009   apixaban (ELIQUIS) tablet 5 mg  5 mg Oral BID [name], NP   5 mg at 02/21/22 0959   ARIPiprazole (ABILIFY) tablet 10 mg  10 mg Oral Daily [name], NP   10 mg at 02/21/22 0959   cloZAPine (CLOZARIL) tablet 75 mg  75 mg Oral BID Teresa Ravens, MD   75 mg at 02/21/22 0998   colchicine tablet 0.6 mg  0.6 mg Oral QHS [name], NP   0.6 mg at 02/20/22 2116   diltiazem (CARDIZEM CD) 24 hr capsule 240 mg  240 mg Oral Daily [name], NP   240 mg at 02/21/22 1000   divalproex (DEPAKOTE ER) 24 hr tablet 500 mg  500 mg Oral BID [name], NP   500 mg at 02/21/22 1000   haloperidol (HALDOL) tablet 10 mg  10 mg Oral TID PRN [name], NP       insulin aspart (novoLOG) injection 0-9 Units  0-9 Units Subcutaneous TID WC Teresa Ravens, MD   1 Units at 02/21/22 1643   insulin glargine-yfgn (SEMGLEE) injection 12 Units  12 Units Subcutaneous Daily [name], NP   12 Units at 02/21/22 1000   latanoprost (XALATAN) 0.005 % ophthalmic solution 1 drop  1 drop Both Eyes QHS [name], NP   1 drop at 02/18/22 2114   magnesium hydroxide (MILK OF MAGNESIA) suspension 30 mL  30 mL Oral Daily PRN [name], NP       metFORMIN (GLUCOPHAGE) tablet 500 mg  500 mg Oral BID WC [name], NP   500 mg at 02/21/22 1643   mometasone-formoterol  (DULERA) 200-5 MCG/ACT inhaler 2 puff  2 puff Inhalation BID [name], NP   2 puff at 02/21/22 0758   nicotine (NICODERM CQ - dosed in mg/24 hr) patch 7 mg  7 mg Transdermal Daily [name], NP   7 mg at 02/21/22 1000   valbenazine (INGREZZA) capsule 40 mg  40 mg Oral q AM [name], NP   40 mg at 02/21/22 0758   Vitamin D (Ergocalciferol) (DRISDOL) 1.25 MG (50000 UNIT) capsule 50,000 Units  50,000 Units Oral Q Leonie Green, NP   50,000 Units at 02/18/22 3893   Current Outpatient Medications  Medication Sig Dispense Refill   Accu-Chek Softclix Lancets lancets Use as directed up to four times daily 100 each 0   apixaban (ELIQUIS) 5 MG TABS tablet Take 1 tablet (5 mg total) by mouth 2 (two) times daily. 60 tablet 0   ARIPiprazole (ABILIFY) 10 MG tablet Take 1 tablet (10 mg total) by mouth daily. 30 tablet 0   Blood Glucose Monitoring Suppl (ACCU-CHEK GUIDE) w/Device KIT Use as directed up to four times daily 1 kit 0   budesonide-formoterol (SYMBICORT) 160-4.5 MCG/ACT inhaler Inhale 2 puffs into the lungs in the morning and at bedtime.     Cholecalciferol (VITAMIN D3) 1.25 MG (50000 UT) CAPS Take 50,000 Units by mouth every Thursday.     clozapine (CLOZARIL) 50 MG tablet Take 1 tablet (50 mg total) by mouth daily. 30 tablet 0   colchicine 0.6 MG tablet Take 1 tablet (0.6 mg total) by mouth at bedtime. 30 tablet 0   diltiazem (CARDIZEM CD) 240 MG 24 hr capsule Take 1 capsule (240 mg total) by mouth daily. (Patient not taking: Reported on 11/24/2021) 30 capsule 0   divalproex (DEPAKOTE ER) 500 MG 24 hr tablet Take 1 tablet (500 mg total) by mouth 2 (two) times daily. 60 tablet 0   glucose blood test strip Use as directed up to four times daily 50 each 0   haloperidol (HALDOL) 10 MG tablet Take 1 tablet (10 mg total) by mouth 3 (three) times daily as needed for agitation (and psychotic symptoms).     INGREZZA 40 MG capsule Take 1 capsule (40 mg total) by mouth in the morning. 30 capsule  0   insulin aspart (NOVOLOG) 100 UNIT/ML FlexPen Before each meal 3 times a day, 140-199 - 2 units, 200-250 - 4 units, 251-299 - 6 units,  300-349 - 8 units,  350 or above 10 units. Insulin PEN if approved, provide syringes and needles if needed.Please switch to any approved short acting Insulin if needed. 15 mL 0   insulin glargine (LANTUS) 100 UNIT/ML Solostar Pen Inject 12 Units into the skin daily. 15 mL 0   Insulin Pen Needle 32G X 4 MM MISC Use 4 times a day with insulin, 1 month supply. 100 each 0   latanoprost (XALATAN) 0.005 % ophthalmic solution Place 1 drop into both eyes at bedtime.     metFORMIN (GLUCOPHAGE) 500 MG tablet Take 500 mg by mouth 2 (two) times daily with a meal.     nicotine (NICODERM CQ - DOSED IN MG/24 HR) 7 mg/24hr patch Place 1 patch (7 mg total) onto the skin daily. 28 patch 0   PROAIR HFA 108 (90 Base) MCG/ACT inhaler Inhale 2 puffs into the lungs every 6 (six) hours as needed for wheezing or shortness of breath.      Labs  Lab Results:  Admission on 02/18/2022  Component Date Value Ref Range Status   Glucose-Capillary 02/18/2022 254 (H)  70 - 99 mg/dL Final   Glucose reference range applies only to samples taken after fasting for at least 8 hours.   Glucose-Capillary 02/18/2022 112 (H)  70 - 99 mg/dL Final   Glucose reference range applies only to samples taken after fasting for at least 8 hours.   Glucose-Capillary 02/19/2022 159 (H)  70 - 99 mg/dL Final   Glucose reference range applies only to samples taken after fasting for at  least 8 hours.   Glucose-Capillary 02/19/2022 160 (H)  70 - 99 mg/dL Final   Glucose reference range applies only to samples taken after fasting for at least 8 hours.   Glucose-Capillary 02/19/2022 177 (H)  70 - 99 mg/dL Final   Glucose reference range applies only to samples taken after fasting for at least 8 hours.   Glucose-Capillary 02/19/2022 147 (H)  70 - 99 mg/dL Final   Glucose reference range applies only to samples taken  after fasting for at least 8 hours.   Glucose-Capillary 02/20/2022 126 (H)  70 - 99 mg/dL Final   Glucose reference range applies only to samples taken after fasting for at least 8 hours.   Glucose-Capillary 02/20/2022 132 (H)  70 - 99 mg/dL Final   Glucose reference range applies only to samples taken after fasting for at least 8 hours.   Glucose-Capillary 02/20/2022 125 (H)  70 - 99 mg/dL Final   Glucose reference range applies only to samples taken after fasting for at least 8 hours.   Glucose-Capillary 02/21/2022 151 (H)  70 - 99 mg/dL Final   Glucose reference range applies only to samples taken after fasting for at least 8 hours.   Glucose-Capillary 02/21/2022 106 (H)  70 - 99 mg/dL Final   Glucose reference range applies only to samples taken after fasting for at least 8 hours.   Glucose-Capillary 02/21/2022 149 (H)  70 - 99 mg/dL Final   Glucose reference range applies only to samples taken after fasting for at least 8 hours.  Admission on 02/17/2022, Discharged on 02/18/2022  Component Date Value Ref Range Status   Sodium 02/17/2022 136  135 - 145 mmol/L Final   Potassium 02/17/2022 3.9  3.5 - 5.1 mmol/L Final   Chloride 02/17/2022 101  98 - 111 mmol/L Final   CO2 02/17/2022 25  22 - 32 mmol/L Final   Glucose, Bld 02/17/2022 113 (H)  70 - 99 mg/dL Final   Glucose reference range applies only to samples taken after fasting for at least 8 hours.   BUN 02/17/2022 9  6 - 20 mg/dL Final   Creatinine, Ser 02/17/2022 0.50  0.44 - 1.00 mg/dL Final   Calcium 02/17/2022 8.9  8.9 - 10.3 mg/dL Final   GFR, Estimated 02/17/2022 >60  >60 mL/min Final   Comment: (NOTE) Calculated using the CKD-EPI Creatinine Equation (2021)    Anion gap 02/17/2022 10  5 - 15 Final   Performed at [facility], [place] [address] [place], [place] [address]   WBC 02/17/2022 6.5  4.0 - 10.5 K/uL Final   RBC 02/17/2022 4.24  3.87 - 5.11 MIL/uL Final   Hemoglobin 02/17/2022 10.2 (L)  12.0 - 15.0 g/dL Final    HCT 02/17/2022 33.2 (L)  36.0 - 46.0 % Final   MCV 02/17/2022 78.3 (L)  80.0 - 100.0 fL Final   MCH 02/17/2022 24.1 (L)  26.0 - 34.0 pg Final   MCHC 02/17/2022 30.7  30.0 - 36.0 g/dL Final   RDW 02/17/2022 17.2 (H)  11.5 - 15.5 % Final   Platelets 02/17/2022 390  150 - 400 K/uL Final   nRBC 02/17/2022 0.0  0.0 - 0.2 % Final   Performed at [facility], [place] [address] [place], [place] [address]   Troponin I (High Sensitivity) 02/17/2022 4  <18 ng/L Final   Comment: (NOTE) Elevated high sensitivity troponin I (hsTnI) values and significant  changes across serial measurements may suggest ACS but  many other  chronic and acute conditions are known to elevate hsTnI results.  Refer to the "Links" section for chest pain algorithms and additional  guidance. Performed at [facility], [place] [address], [place], [place] [address]    Valproic Acid Lvl 02/17/2022 17 (L)  50.0 - 100.0 ug/mL Final   Performed at [place] [address], [place], [place] [address]   B Natriuretic Peptide 02/17/2022 30.9  0.0 - 100.0 pg/mL Final   Performed at [place] [address], [place], [place] [address]   Troponin I (High Sensitivity) 02/18/2022 4  <18 ng/L Final   Comment: (NOTE) Elevated high sensitivity troponin I (hsTnI) values and significant  changes across serial measurements may suggest ACS but many other  chronic and acute conditions are known to elevate hsTnI results.  Refer to the "Links" section for chest pain algorithms and additional  guidance. Performed at [facility], [place] [address], [place], [place] [address]    D-Dimer, Quant 02/18/2022 0.29  0.00 - 0.50 ug/mL-FEU Final   Comment: (NOTE) At the manufacturer cut-off value of 0.5 g/mL FEU, this assay has a negative predictive value of 95-100%.This assay is intended for use in conjunction with a clinical pretest probability (PTP) assessment model to exclude pulmonary embolism (PE) and deep venous  thrombosis (DVT) in outpatients suspected of PE or DVT. Results should be correlated with clinical presentation. Performed at [facility], [place] [address] [place], [place] [address]   Admission on 02/09/2022, Discharged on 02/17/2022  Component Date Value Ref Range Status   Glucose-Capillary 02/09/2022 118 (H)  70 - 99 mg/dL Final   Glucose reference range applies only to samples taken after fasting for at least 8 hours.   Glucose-Capillary 02/10/2022 148 (H)  70 - 99 mg/dL Final   Glucose reference range applies only to samples taken after fasting for at least 8 hours.   Glucose-Capillary 02/10/2022 174 (H)  70 - 99 mg/dL Final   Glucose reference range applies only to samples taken after fasting for at least 8 hours.   Glucose-Capillary 02/10/2022 128 (H)  70 - 99 mg/dL Final   Glucose reference range applies only to samples taken after fasting for at least 8 hours.   Glucose-Capillary 02/10/2022 182 (H)  70 - 99 mg/dL Final   Glucose reference range applies only to samples taken after fasting for at least 8 hours.   Glucose-Capillary 02/11/2022 158 (H)  70 - 99 mg/dL Final   Glucose reference range applies only to samples taken after fasting for at least 8 hours.   Glucose-Capillary 02/11/2022 159 (H)  70 - 99 mg/dL Final   Glucose reference range applies only to samples taken after fasting for at least 8 hours.   Glucose-Capillary 02/11/2022 153 (H)  70 - 99 mg/dL Final   Glucose reference range applies only to samples taken after fasting for at least 8 hours.   Glucose-Capillary 02/11/2022 159 (H)  70 - 99 mg/dL Final   Glucose reference range applies only to samples taken after fasting for at least 8 hours.   Glucose-Capillary 02/11/2022 153 (H)  70 - 99 mg/dL Final   Glucose reference range applies only to samples taken after fasting for at least 8 hours.   Glucose-Capillary 02/11/2022 109 (H)  70 - 99 mg/dL Final   Glucose reference range applies only to samples taken  after fasting for at least 8 hours.   Glucose-Capillary 02/11/2022 213 (  H)  70 - 99 mg/dL Final   Glucose reference range applies only to samples taken after fasting for at least 8 hours.   Glucose-Capillary 02/11/2022 229 (H)  70 - 99 mg/dL Final   Glucose reference range applies only to samples taken after fasting for at least 8 hours.   Glucose-Capillary 02/12/2022 128 (H)  70 - 99 mg/dL Final   Glucose reference range applies only to samples taken after fasting for at least 8 hours.   Glucose-Capillary 02/12/2022 149 (H)  70 - 99 mg/dL Final   Glucose reference range applies only to samples taken after fasting for at least 8 hours.   Color, Urine 02/12/2022 YELLOW  YELLOW Final   APPearance 02/12/2022 CLEAR  CLEAR Final   Specific Gravity, Urine 02/12/2022 1.015  1.005 - 1.030 Final   pH 02/12/2022 5.0  5.0 - 8.0 Final   Glucose, UA 02/12/2022 NEGATIVE  NEGATIVE mg/dL Final   Hgb urine dipstick 02/12/2022 NEGATIVE  NEGATIVE Final   Bilirubin Urine 02/12/2022 NEGATIVE  NEGATIVE Final   Ketones, ur 02/12/2022 NEGATIVE  NEGATIVE mg/dL Final   Protein, ur 02/12/2022 NEGATIVE  NEGATIVE mg/dL Final   Nitrite 02/12/2022 NEGATIVE  NEGATIVE Final   Leukocytes,Ua 02/12/2022 NEGATIVE  NEGATIVE Final   Performed at [facility], [place] [address], [place], [place] [address]   Specimen Description 02/12/2022 URINE, CLEAN CATCH   Final   Special Requests 02/12/2022    Final                   Value:NONE Performed at [facility], [place] [address], [place], [place] [address]    Culture 02/12/2022 10,000 COLONIES/mL PROTEUS MIRABILIS (A)   Final   Report Status 02/12/2022 02/15/2022 FINAL   Final   Organism ID, Bacteria 02/12/2022 PROTEUS MIRABILIS (A)   Final   Glucose-Capillary 02/12/2022 128 (H)  70 - 99 mg/dL Final   Glucose reference range applies only to samples taken after fasting for at least 8 hours.   Glucose-Capillary 02/12/2022 196 (H)  70 - 99 mg/dL Final   Glucose reference  range applies only to samples taken after fasting for at least 8 hours.   Glucose-Capillary 02/13/2022 168 (H)  70 - 99 mg/dL Final   Glucose reference range applies only to samples taken after fasting for at least 8 hours.   Glucose-Capillary 02/13/2022 153 (H)  70 - 99 mg/dL Final   Glucose reference range applies only to samples taken after fasting for at least 8 hours.   Glucose-Capillary 02/13/2022 134 (H)  70 - 99 mg/dL Final   Glucose reference range applies only to samples taken after fasting for at least 8 hours.   Glucose-Capillary 02/13/2022 178 (H)  70 - 99 mg/dL Final   Glucose reference range applies only to samples taken after fasting for at least 8 hours.   Glucose-Capillary 02/14/2022 156 (H)  70 - 99 mg/dL Final   Glucose reference range applies only to samples taken after fasting for at least 8 hours.   Glucose-Capillary 02/14/2022 169 (H)  70 - 99 mg/dL Final   Glucose reference range applies only to samples taken after fasting for at least 8 hours.   Glucose-Capillary 02/14/2022 156 (H)  70 - 99 mg/dL Final   Glucose reference range applies only to samples taken after fasting for at least 8 hours.   Glucose-Capillary 02/14/2022 148 (H)  70 - 99 mg/dL Final   Glucose reference range applies only to samples  taken after fasting for at least 8 hours.   Glucose-Capillary 02/15/2022 159 (H)  70 - 99 mg/dL Final   Glucose reference range applies only to samples taken after fasting for at least 8 hours.   Glucose-Capillary 02/15/2022 125 (H)  70 - 99 mg/dL Final   Glucose reference range applies only to samples taken after fasting for at least 8 hours.   Glucose-Capillary 02/15/2022 142 (H)  70 - 99 mg/dL Final   Glucose reference range applies only to samples taken after fasting for at least 8 hours.   Glucose-Capillary 02/15/2022 128 (H)  70 - 99 mg/dL Final   Glucose reference range applies only to samples taken after fasting for at least 8 hours.   Glucose-Capillary  02/16/2022 137 (H)  70 - 99 mg/dL Final   Glucose reference range applies only to samples taken after fasting for at least 8 hours.   WBC 02/16/2022 6.0  4.0 - 10.5 K/uL Final   RBC 02/16/2022 4.48  3.87 - 5.11 MIL/uL Final   Hemoglobin 02/16/2022 10.7 (L)  12.0 - 15.0 g/dL Final   HCT 02/16/2022 35.4 (L)  36.0 - 46.0 % Final   MCV 02/16/2022 79.0 (L)  80.0 - 100.0 fL Final   MCH 02/16/2022 23.9 (L)  26.0 - 34.0 pg Final   MCHC 02/16/2022 30.2  30.0 - 36.0 g/dL Final   RDW 02/16/2022 17.0 (H)  11.5 - 15.5 % Final   Platelets 02/16/2022 387  150 - 400 K/uL Final   nRBC 02/16/2022 0.0  0.0 - 0.2 % Final   Neutrophils Relative % 02/16/2022 54  % Final   Neutro Abs 02/16/2022 3.2  1.7 - 7.7 K/uL Final   Lymphocytes Relative 02/16/2022 35  % Final   Lymphs Abs 02/16/2022 2.1  0.7 - 4.0 K/uL Final   Monocytes Relative 02/16/2022 6  % Final   Monocytes Absolute 02/16/2022 0.4  0.1 - 1.0 K/uL Final   Eosinophils Relative 02/16/2022 2  % Final   Eosinophils Absolute 02/16/2022 0.1  0.0 - 0.5 K/uL Final   Basophils Relative 02/16/2022 2  % Final   Basophils Absolute 02/16/2022 0.1  0.0 - 0.1 K/uL Final   Immature Granulocytes 02/16/2022 1  % Final   Abs Immature Granulocytes 02/16/2022 0.04  0.00 - 0.07 K/uL Final   Performed at [facility], [place] [address], [place], [place] [address]   Glucose-Capillary 02/16/2022 131 (H)  70 - 99 mg/dL Final   Glucose reference range applies only to samples taken after fasting for at least 8 hours.   Glucose-Capillary 02/16/2022 123 (H)  70 - 99 mg/dL Final   Glucose reference range applies only to samples taken after fasting for at least 8 hours.   Glucose-Capillary 02/16/2022 195 (H)  70 - 99 mg/dL Final   Glucose reference range applies only to samples taken after fasting for at least 8 hours.   Glucose-Capillary 02/17/2022 148 (H)  70 - 99 mg/dL Final   Glucose reference range applies only to samples taken after fasting for at least 8 hours.   Admission on 02/08/2022, Discharged on 02/09/2022  Component Date Value Ref Range Status   B Natriuretic Peptide 02/08/2022 40.2  0.0 - 100.0 pg/mL Final   Performed at [facility], [address] [place], [place] [address]   Troponin I (High Sensitivity) 02/08/2022 4  <18 ng/L Final   Comment: (NOTE) Elevated high sensitivity troponin I (hsTnI) values and significant  changes across serial  measurements may suggest ACS but many other  chronic and acute conditions are known to elevate hsTnI results.  Refer to the "Links" section for chest pain algorithms and additional  guidance. Performed at [facility], [place] [address], [place], [place] [address]    WBC 02/08/2022 8.5  4.0 - 10.5 K/uL Final   RBC 02/08/2022 4.30  3.87 - 5.11 MIL/uL Final   Hemoglobin 02/08/2022 10.7 (L)  12.0 - 15.0 g/dL Final   HCT 02/08/2022 33.1 (L)  36.0 - 46.0 % Final   MCV 02/08/2022 77.0 (L)  80.0 - 100.0 fL Final   MCH 02/08/2022 24.9 (L)  26.0 - 34.0 pg Final   MCHC 02/08/2022 32.3  30.0 - 36.0 g/dL Final   RDW 02/08/2022 16.5 (H)  11.5 - 15.5 % Final   Platelets 02/08/2022 361  150 - 400 K/uL Final   nRBC 02/08/2022 0.0  0.0 - 0.2 % Final   Performed at [facility] [address] [place], [place] [address]   Sodium 02/08/2022 131 (L)  135 - 145 mmol/L Final   Potassium 02/08/2022 4.4  3.5 - 5.1 mmol/L Final   Chloride 02/08/2022 96 (L)  98 - 111 mmol/L Final   CO2 02/08/2022 25  22 - 32 mmol/L Final   Glucose, Bld 02/08/2022 126 (H)  70 - 99 mg/dL Final   Glucose reference range applies only to samples taken after fasting for at least 8 hours.   BUN 02/08/2022 11  6 - 20 mg/dL Final   Creatinine, Ser 02/08/2022 0.52  0.44 - 1.00 mg/dL Final   Calcium 02/08/2022 9.4  8.9 - 10.3 mg/dL Final   Total Protein 02/08/2022 7.5  6.5 - 8.1 g/dL Final   Albumin 02/08/2022 3.6  3.5 - 5.0 g/dL Final   AST 02/08/2022 22  15 - 41 U/L Final   ALT 02/08/2022 20  0 - 44 U/L Final   Alkaline  Phosphatase 02/08/2022 67  38 - 126 U/L Final   Total Bilirubin 02/08/2022 0.1 (L)  0.3 - 1.2 mg/dL Final   GFR, Estimated 02/08/2022 >60  >60 mL/min Final   Comment: (NOTE) Calculated using the CKD-EPI Creatinine Equation (2021)    Anion gap 02/08/2022 10  5 - 15 Final   Performed at [facility] [address] [place], [place] [address]   Troponin I (High Sensitivity) 02/08/2022 5  <18 ng/L Final   Comment: (NOTE) Elevated high sensitivity troponin I (hsTnI) values and significant  changes across serial measurements may suggest ACS but many other  chronic and acute conditions are known to elevate hsTnI results.  Refer to the "Links" section for chest pain algorithms and additional  guidance. Performed at [facility] [facility], [place] [address], [place], [place] [address]    WBC 02/09/2022 7.3  4.0 - 10.5 K/uL Final   RBC 02/09/2022 4.70  3.87 - 5.11 MIL/uL Final   Hemoglobin 02/09/2022 11.4 (L)  12.0 - 15.0 g/dL Final   HCT 02/09/2022 36.6  36.0 - 46.0 % Final   MCV 02/09/2022 77.9 (L)  80.0 - 100.0 fL Final   MCH 02/09/2022 24.3 (L)  26.0 - 34.0 pg Final   MCHC 02/09/2022 31.1  30.0 - 36.0 g/dL Final   RDW 02/09/2022 16.6 (H)  11.5 - 15.5 % Final   Platelets 02/09/2022 403 (H)  150 - 400 K/uL Final   nRBC 02/09/2022 0.0  0.0 - 0.2 % Final   Performed at [facility], [place] [address], [place], [place] [address]   Glucose-Capillary 02/08/2022 117 (H)  70 - 99 mg/dL Final   Glucose reference range applies only to samples taken after fasting for at least 8 hours.   Sodium 02/09/2022 138  135 - 145 mmol/L Final   Potassium 02/09/2022 3.7  3.5 - 5.1 mmol/L Final   Chloride 02/09/2022 103  98 - 111 mmol/L Final   CO2 02/09/2022 24  22 - 32 mmol/L Final   Glucose, Bld 02/09/2022 134 (H)  70 - 99 mg/dL Final   Glucose reference range applies only to samples taken after fasting for at least 8 hours.   BUN 02/09/2022 9  6 - 20 mg/dL Final   Creatinine, Ser 02/09/2022 0.44  0.44  - 1.00 mg/dL Final   Calcium 02/09/2022 8.3 (L)  8.9 - 10.3 mg/dL Final   GFR, Estimated 02/09/2022 >60  >60 mL/min Final   Comment: (NOTE) Calculated using the CKD-EPI Creatinine Equation (2021)    Anion gap 02/09/2022 11  5 - 15 Final   Performed at [facility], [place] [address], [place], [place] [address]   Iron 02/09/2022 20 (L)  28 - 170 ug/dL Final   TIBC 02/09/2022 455 (H)  250 - 450 ug/dL Final   Saturation Ratios 02/09/2022 4 (L)  10.4 - 31.8 % Final   UIBC 02/09/2022 435  ug/dL Final   Performed at [facility], [place] [address] [place], [place] [address]   Ferritin 02/09/2022 2 (L)  11 - 307 ng/mL Final   Performed at [place] [address] [place], [place] [address]   Weight 02/09/2022 2,846.58  oz Final   Height 02/09/2022 62  in Final   BP 02/09/2022 143/80  mmHg Final   WBC 02/09/2022 8.1  4.0 - 10.5 K/uL Final   RBC 02/09/2022 4.38  3.87 - 5.11 MIL/uL Final   Hemoglobin 02/09/2022 10.9 (L)  12.0 - 15.0 g/dL Final   HCT 02/09/2022 33.3 (L)  36.0 - 46.0 % Final   MCV 02/09/2022 76.0 (L)  80.0 - 100.0 fL Final   MCH 02/09/2022 24.9 (L)  26.0 - 34.0 pg Final   MCHC 02/09/2022 32.7  30.0 - 36.0 g/dL Final   RDW 02/09/2022 16.5 (H)  11.5 - 15.5 % Final   Platelets 02/09/2022 397  150 - 400 K/uL Final   nRBC 02/09/2022 0.0  0.0 - 0.2 % Final   Neutrophils Relative % 02/09/2022 66  % Final   Neutro Abs 02/09/2022 5.4  1.7 - 7.7 K/uL Final   Lymphocytes Relative 02/09/2022 24  % Final   Lymphs Abs 02/09/2022 1.9  0.7 - 4.0 K/uL Final   Monocytes Relative 02/09/2022 8  % Final   Monocytes Absolute 02/09/2022 0.6  0.1 - 1.0 K/uL Final   Eosinophils Relative 02/09/2022 1  % Final   Eosinophils Absolute 02/09/2022 0.1  0.0 - 0.5 K/uL Final   Basophils Relative 02/09/2022 1  % Final   Basophils Absolute 02/09/2022 0.1  0.0 - 0.1 K/uL Final   Immature Granulocytes 02/09/2022 0  % Final   Abs Immature Granulocytes 02/09/2022 0.03  0.00 - 0.07 K/uL Final    Performed at [facility], [place] [address] [place], [place] [address]   Glucose-Capillary 02/09/2022 238 (H)  70 - 99 mg/dL Final   Glucose reference range applies only to samples taken after fasting for at least 8 hours.   Glucose-Capillary 02/09/2022  91  70 - 99 mg/dL Final   Glucose reference range applies only to samples taken after fasting for at least 8 hours.   CRP 02/09/2022 <0.5  <1.0 mg/dL Final   Performed at [place] [address] [place], [place] [address]   Sed Rate 02/09/2022 15  0 - 22 mm/hr Final   Performed at [place] [address], [place], [place] [address]   Glucose-Capillary 02/09/2022 137 (H)  70 - 99 mg/dL Final   Glucose reference range applies only to samples taken after fasting for at least 8 hours.  Admission on 01/24/2022, Discharged on 02/08/2022  Component Date Value Ref Range Status   Glucose-Capillary 01/24/2022 143 (H)  70 - 99 mg/dL Final   Glucose reference range applies only to samples taken after fasting for at least 8 hours.   Glucose-Capillary 01/24/2022 120 (H)  70 - 99 mg/dL Final   Glucose reference range applies only to samples taken after fasting for at least 8 hours.   Glucose-Capillary 01/25/2022 122 (H)  70 - 99 mg/dL Final   Glucose reference range applies only to samples taken after fasting for at least 8 hours.   Glucose-Capillary 01/25/2022 190 (H)  70 - 99 mg/dL Final   Glucose reference range applies only to samples taken after fasting for at least 8 hours.   Glucose-Capillary 01/25/2022 163 (H)  70 - 99 mg/dL Final   Glucose reference range applies only to samples taken after fasting for at least 8 hours.   WBC 01/26/2022 6.5  4.0 - 10.5 K/uL Final   RBC 01/26/2022 4.53  3.87 - 5.11 MIL/uL Final   Hemoglobin 01/26/2022 11.3 (L)  12.0 - 15.0 g/dL Final   HCT 01/26/2022 36.4  36.0 - 46.0 % Final   MCV 01/26/2022 80.4  80.0 - 100.0 fL Final   MCH 01/26/2022 24.9 (L)  26.0 - 34.0 pg Final   MCHC  01/26/2022 31.0  30.0 - 36.0 g/dL Final   RDW 01/26/2022 17.3 (H)  11.5 - 15.5 % Final   Platelets 01/26/2022 327  150 - 400 K/uL Final   nRBC 01/26/2022 0.0  0.0 - 0.2 % Final   Neutrophils Relative % 01/26/2022 60  % Final   Neutro Abs 01/26/2022 3.9  1.7 - 7.7 K/uL Final   Lymphocytes Relative 01/26/2022 31  % Final   Lymphs Abs 01/26/2022 2.0  0.7 - 4.0 K/uL Final   Monocytes Relative 01/26/2022 7  % Final   Monocytes Absolute 01/26/2022 0.5  0.1 - 1.0 K/uL Final   Eosinophils Relative 01/26/2022 1  % Final   Eosinophils Absolute 01/26/2022 0.1  0.0 - 0.5 K/uL Final   Basophils Relative 01/26/2022 1  % Final   Basophils Absolute 01/26/2022 0.1  0.0 - 0.1 K/uL Final   Immature Granulocytes 01/26/2022 0  % Final   Abs Immature Granulocytes 01/26/2022 0.02  0.00 - 0.07 K/uL Final   Performed at [facility], [place] [address], [place], [place] [address]   Glucose-Capillary 01/26/2022 149 (H)  70 - 99 mg/dL Final   Glucose reference range applies only to samples taken after fasting for at least 8 hours.   Glucose-Capillary 01/26/2022 130 (H)  70 - 99 mg/dL Final   Glucose reference range applies only to samples taken after fasting for at least 8 hours.   Glucose-Capillary 01/26/2022 151 (H)  70 - 99 mg/dL Final   Glucose reference range applies only to samples taken  after fasting for at least 8 hours.   Glucose-Capillary 01/26/2022 170 (H)  70 - 99 mg/dL Final   Glucose reference range applies only to samples taken after fasting for at least 8 hours.   Glucose-Capillary 01/27/2022 129 (H)  70 - 99 mg/dL Final   Glucose reference range applies only to samples taken after fasting for at least 8 hours.   Glucose-Capillary 01/27/2022 101 (H)  70 - 99 mg/dL Final   Glucose reference range applies only to samples taken after fasting for at least 8 hours.   Glucose-Capillary 01/27/2022 220 (H)  70 - 99 mg/dL Final   Glucose reference range applies only to samples taken after fasting for at  least 8 hours.   Glucose-Capillary 01/27/2022 154 (H)  70 - 99 mg/dL Final   Glucose reference range applies only to samples taken after fasting for at least 8 hours.   Glucose-Capillary 01/27/2022 163 (H)  70 - 99 mg/dL Final   Glucose reference range applies only to samples taken after fasting for at least 8 hours.   Glucose-Capillary 01/28/2022 127 (H)  70 - 99 mg/dL Final   Glucose reference range applies only to samples taken after fasting for at least 8 hours.   Glucose-Capillary 01/28/2022 110 (H)  70 - 99 mg/dL Final   Glucose reference range applies only to samples taken after fasting for at least 8 hours.   Glucose-Capillary 01/28/2022 167 (H)  70 - 99 mg/dL Final   Glucose reference range applies only to samples taken after fasting for at least 8 hours.   Glucose-Capillary 01/29/2022 145 (H)  70 - 99 mg/dL Final   Glucose reference range applies only to samples taken after fasting for at least 8 hours.   Glucose-Capillary 01/29/2022 203 (H)  70 - 99 mg/dL Final   Glucose reference range applies only to samples taken after fasting for at least 8 hours.   Glucose-Capillary 01/29/2022 153 (H)  70 - 99 mg/dL Final   Glucose reference range applies only to samples taken after fasting for at least 8 hours.   Glucose-Capillary 01/29/2022 166 (H)  70 - 99 mg/dL Final   Glucose reference range applies only to samples taken after fasting for at least 8 hours.   Glucose-Capillary 01/30/2022 142 (H)  70 - 99 mg/dL Final   Glucose reference range applies only to samples taken after fasting for at least 8 hours.   Glucose-Capillary 01/30/2022 138 (H)  70 - 99 mg/dL Final   Glucose reference range applies only to samples taken after fasting for at least 8 hours.   Glucose-Capillary 01/30/2022 185 (H)  70 - 99 mg/dL Final   Glucose reference range applies only to samples taken after fasting for at least 8 hours.   Glucose-Capillary 01/30/2022 220 (H)  70 - 99 mg/dL Final   Glucose reference  range applies only to samples taken after fasting for at least 8 hours.   Glucose-Capillary 01/31/2022 147 (H)  70 - 99 mg/dL Final   Glucose reference range applies only to samples taken after fasting for at least 8 hours.   Glucose-Capillary 01/31/2022 131 (H)  70 - 99 mg/dL Final   Glucose reference range applies only to samples taken after fasting for at least 8 hours.   Glucose-Capillary 01/31/2022 169 (H)  70 - 99 mg/dL Final   Glucose reference range applies only to samples taken after fasting for at least 8 hours.   Glucose-Capillary 01/31/2022 144 (H)  70 - 99 mg/dL  Final   Glucose reference range applies only to samples taken after fasting for at least 8 hours.   Comment 1 01/31/2022 RN AWARE   Final   Glucose-Capillary 02/01/2022 117 (H)  70 - 99 mg/dL Final   Glucose reference range applies only to samples taken after fasting for at least 8 hours.   Glucose-Capillary 02/01/2022 180 (H)  70 - 99 mg/dL Final   Glucose reference range applies only to samples taken after fasting for at least 8 hours.   Glucose-Capillary 02/01/2022 209 (H)  70 - 99 mg/dL Final   Glucose reference range applies only to samples taken after fasting for at least 8 hours.   Glucose-Capillary 02/02/2022 131 (H)  70 - 99 mg/dL Final   Glucose reference range applies only to samples taken after fasting for at least 8 hours.   WBC 02/02/2022 7.5  4.0 - 10.5 K/uL Final   RBC 02/02/2022 4.52  3.87 - 5.11 MIL/uL Final   Hemoglobin 02/02/2022 11.3 (L)  12.0 - 15.0 g/dL Final   HCT 02/02/2022 35.0 (L)  36.0 - 46.0 % Final   MCV 02/02/2022 77.4 (L)  80.0 - 100.0 fL Final   MCH 02/02/2022 25.0 (L)  26.0 - 34.0 pg Final   MCHC 02/02/2022 32.3  30.0 - 36.0 g/dL Final   RDW 02/02/2022 16.7 (H)  11.5 - 15.5 % Final   Platelets 02/02/2022 345  150 - 400 K/uL Final   nRBC 02/02/2022 0.0  0.0 - 0.2 % Final   Neutrophils Relative % 02/02/2022 63  % Final   Neutro Abs 02/02/2022 4.7  1.7 - 7.7 K/uL Final   Lymphocytes  Relative 02/02/2022 28  % Final   Lymphs Abs 02/02/2022 2.1  0.7 - 4.0 K/uL Final   Monocytes Relative 02/02/2022 7  % Final   Monocytes Absolute 02/02/2022 0.5  0.1 - 1.0 K/uL Final   Eosinophils Relative 02/02/2022 1  % Final   Eosinophils Absolute 02/02/2022 0.1  0.0 - 0.5 K/uL Final   Basophils Relative 02/02/2022 1  % Final   Basophils Absolute 02/02/2022 0.1  0.0 - 0.1 K/uL Final   Immature Granulocytes 02/02/2022 0  % Final   Abs Immature Granulocytes 02/02/2022 0.03  0.00 - 0.07 K/uL Final   Performed at [facility], [place] [address] [place], [place] [address]   Glucose-Capillary 02/02/2022 95  70 - 99 mg/dL Final   Glucose reference range applies only to samples taken after fasting for at least 8 hours.   Glucose-Capillary 02/02/2022 120 (H)  70 - 99 mg/dL Final   Glucose reference range applies only to samples taken after fasting for at least 8 hours.   Glucose-Capillary 02/02/2022 191 (H)  70 - 99 mg/dL Final   Glucose reference range applies only to samples taken after fasting for at least 8 hours.   Glucose-Capillary 02/03/2022 148 (H)  70 - 99 mg/dL Final   Glucose reference range applies only to samples taken after fasting for at least 8 hours.   Glucose-Capillary 02/03/2022 122 (H)  70 - 99 mg/dL Final   Glucose reference range applies only to samples taken after fasting for at least 8 hours.   Glucose-Capillary 02/03/2022 233 (H)  70 - 99 mg/dL Final   Glucose reference range applies only to samples taken after fasting for at least 8 hours.   Glucose-Capillary 02/04/2022 126 (H)  70 - 99 mg/dL Final   Glucose reference range applies only to samples taken after fasting  for at least 8 hours.   Glucose-Capillary 02/04/2022 117 (H)  70 - 99 mg/dL Final   Glucose reference range applies only to samples taken after fasting for at least 8 hours.   Glucose-Capillary 02/04/2022 135 (H)  70 - 99 mg/dL Final   Glucose reference range applies only to samples taken after fasting  for at least 8 hours.   Glucose-Capillary 02/04/2022 197 (H)  70 - 99 mg/dL Final   Glucose reference range applies only to samples taken after fasting for at least 8 hours.   Glucose-Capillary 02/05/2022 170 (H)  70 - 99 mg/dL Final   Glucose reference range applies only to samples taken after fasting for at least 8 hours.   Glucose-Capillary 02/05/2022 107 (H)  70 - 99 mg/dL Final   Glucose reference range applies only to samples taken after fasting for at least 8 hours.   Glucose-Capillary 02/05/2022 184 (H)  70 - 99 mg/dL Final   Glucose reference range applies only to samples taken after fasting for at least 8 hours.   Glucose-Capillary 02/05/2022 256 (H)  70 - 99 mg/dL Final   Glucose reference range applies only to samples taken after fasting for at least 8 hours.   Glucose-Capillary 02/06/2022 144 (H)  70 - 99 mg/dL Final   Glucose reference range applies only to samples taken after fasting for at least 8 hours.   Glucose-Capillary 02/06/2022 190 (H)  70 - 99 mg/dL Final   Glucose reference range applies only to samples taken after fasting for at least 8 hours.   Glucose-Capillary 02/06/2022 92  70 - 99 mg/dL Final   Glucose reference range applies only to samples taken after fasting for at least 8 hours.   Trichomonas 02/06/2022 Negative   Final   Bacterial Vaginitis-Urine 02/06/2022 Negative   Final   Molecular Comment 02/06/2022 For tests bacteria and/or candida, this specimen does not meet the   Final   Molecular Comment 02/06/2022 strict criteria set by the FDA. The result interpretation should be   Final   Molecular Comment 02/06/2022 considered in conjunction with the patient's clinical history.   Final   Comment 02/06/2022 Normal Reference Range Trichomonas - Negative   Final   Glucose-Capillary 02/06/2022 197 (H)  70 - 99 mg/dL Final   Glucose reference range applies only to samples taken after fasting for at least 8 hours.   Glucose-Capillary 02/07/2022 129 (H)  70 - 99  mg/dL Final   Glucose reference range applies only to samples taken after fasting for at least 8 hours.   Glucose-Capillary 02/07/2022 207 (H)  70 - 99 mg/dL Final   Glucose reference range applies only to samples taken after fasting for at least 8 hours.   Glucose-Capillary 02/07/2022 153 (H)  70 - 99 mg/dL Final   Glucose reference range applies only to samples taken after fasting for at least 8 hours.   Glucose-Capillary 02/08/2022 129 (H)  70 - 99 mg/dL Final   Glucose reference range applies only to samples taken after fasting for at least 8 hours.   Glucose-Capillary 02/08/2022 107 (H)  70 - 99 mg/dL Final   Glucose reference range applies only to samples taken after fasting for at least 8 hours.  Admission on 01/23/2022, Discharged on 01/24/2022  Component Date Value Ref Range Status   Sodium 01/23/2022 140  135 - 145 mmol/L Final   Potassium 01/23/2022 4.4  3.5 - 5.1 mmol/L Final   Chloride 01/23/2022 101  98 - 111 mmol/L  Final   CO2 01/23/2022 25  22 - 32 mmol/L Final   Glucose, Bld 01/23/2022 198 (H)  70 - 99 mg/dL Final   Glucose reference range applies only to samples taken after fasting for at least 8 hours.   BUN 01/23/2022 13  6 - 20 mg/dL Final   Creatinine, Ser 01/23/2022 0.62  0.44 - 1.00 mg/dL Final   Calcium 01/23/2022 9.3  8.9 - 10.3 mg/dL Final   GFR, Estimated 01/23/2022 >60  >60 mL/min Final   Comment: (NOTE) Calculated using the CKD-EPI Creatinine Equation (2021)    Anion gap 01/23/2022 14  5 - 15 Final   Performed at [facility], [place] [address], [place], [place] [address]   WBC 01/23/2022 5.8  4.0 - 10.5 K/uL Final   RBC 01/23/2022 4.63  3.87 - 5.11 MIL/uL Final   Hemoglobin 01/23/2022 11.7 (L)  12.0 - 15.0 g/dL Final   HCT 01/23/2022 37.4  36.0 - 46.0 % Final   MCV 01/23/2022 80.8  80.0 - 100.0 fL Final   MCH 01/23/2022 25.3 (L)  26.0 - 34.0 pg Final   MCHC 01/23/2022 31.3  30.0 - 36.0 g/dL Final   RDW 01/23/2022 17.9 (H)  11.5 - 15.5 % Final    Platelets 01/23/2022 333  150 - 400 K/uL Final   nRBC 01/23/2022 0.0  0.0 - 0.2 % Final   Performed at [place] [address], [place], [place] [address]   Troponin I (High Sensitivity) 01/23/2022 6  <18 ng/L Final   Comment: (NOTE) Elevated high sensitivity troponin I (hsTnI) values and significant  changes across serial measurements may suggest ACS but many other  chronic and acute conditions are known to elevate hsTnI results.  Refer to the "Links" section for chest pain algorithms and additional  guidance. Performed at [facility], [place] [address] [place], [place] [address]    Troponin I (High Sensitivity) 01/23/2022 5  <18 ng/L Final   Comment: (NOTE) Elevated high sensitivity troponin I (hsTnI) values and significant  changes across serial measurements may suggest ACS but many other  chronic and acute conditions are known to elevate hsTnI results.  Refer to the "Links" section for chest pain algorithms and additional  guidance. Performed at [facility], [place] [address], [place], [place] [address]    SARS Coronavirus 2 by RT PCR 01/23/2022 NEGATIVE  NEGATIVE Final   Comment: (NOTE) SARS-CoV-2 target nucleic acids are NOT DETECTED.  The SARS-CoV-2 RNA is generally detectable in upper and lower respiratory specimens during the acute phase of infection. The lowest concentration of SARS-CoV-2 viral copies this assay can detect is 250 copies / mL. A negative result does not preclude SARS-CoV-2 infection and should not be used as the sole basis for treatment or other patient management decisions.  A negative result may occur with improper specimen collection / handling, submission of specimen other than nasopharyngeal swab, presence of viral mutation(s) within the areas targeted by this assay, and inadequate number of viral copies (<250 copies / mL). A negative result must be combined with clinical observations, patient history, and epidemiological  information.  Fact Sheet for Patients:   https://www.patel.info/  Fact Sheet for Healthcare Providers: https://hall.com/  This test is not yet approved or                           cleared by the [place] and has been authorized  for detection and/or diagnosis of SARS-CoV-2 by FDA under an Emergency Use Authorization (EUA).  This EUA will remain in effect (meaning this test can be used) for the duration of the COVID-19 declaration under Section 564(b)(1) of the Act, 21 U.S.C. section 360bbb-3(b)(1), unless the authorization is terminated or revoked sooner.  Performed at [facility], [place] [address], [place], [place] [address]    B Natriuretic Peptide 01/23/2022 41.3  0.0 - 100.0 pg/mL Final   Performed at [place] [address] [place], [place] [address]   Group A Strep by PCR 01/23/2022 NOT DETECTED  NOT DETECTED Final   Performed at [facility], [place] [address], [place], [place] [address]   Color, Urine 01/23/2022 YELLOW  YELLOW Final   APPearance 01/23/2022 CLEAR  CLEAR Final   Specific Gravity, Urine 01/23/2022 1.018  1.005 - 1.030 Final   pH 01/23/2022 7.0  5.0 - 8.0 Final   Glucose, UA 01/23/2022 NEGATIVE  NEGATIVE mg/dL Final   Hgb urine dipstick 01/23/2022 NEGATIVE  NEGATIVE Final   Bilirubin Urine 01/23/2022 NEGATIVE  NEGATIVE Final   Ketones, ur 01/23/2022 NEGATIVE  NEGATIVE mg/dL Final   Protein, ur 01/23/2022 NEGATIVE  NEGATIVE mg/dL Final   Nitrite 01/23/2022 NEGATIVE  NEGATIVE Final   Leukocytes,Ua 01/23/2022 TRACE (A)  NEGATIVE Final   RBC / HPF 01/23/2022 0-5  0 - 5 RBC/hpf Final   WBC, UA 01/23/2022 0-5  0 - 5 WBC/hpf Final   Bacteria, UA 01/23/2022 NONE SEEN  NONE SEEN Final   Squamous Epithelial / LPF 01/23/2022 0-5  0 - 5 Final   Mucus 01/23/2022 PRESENT   Final   Performed at [facility], [place] [address] [place], [place] [address]   SARS Coronavirus 2 by RT PCR 01/23/2022  NEGATIVE  NEGATIVE Final   Comment: (NOTE) SARS-CoV-2 target nucleic acids are NOT DETECTED.  The SARS-CoV-2 RNA is generally detectable in upper respiratory specimens during the acute phase of infection. The lowest concentration of SARS-CoV-2 viral copies this assay can detect is 138 copies/mL. A negative result does not preclude SARS-Cov-2 infection and should not be used as the sole basis for treatment or other patient management decisions. A negative result may occur with  improper specimen collection/handling, submission of specimen other than nasopharyngeal swab, presence of viral mutation(s) within the areas targeted by this assay, and inadequate number of viral copies(<138 copies/mL). A negative result must be combined with clinical observations, patient history, and epidemiological information. The expected result is Negative.  Fact Sheet for Patients:  EntrepreneurPulse.com.au  Fact Sheet for Healthcare Providers:  IncredibleEmployment.be  This test is no                          t yet approved or cleared by the [place] FDA and  has been authorized for detection and/or diagnosis of SARS-CoV-2 by FDA under an Emergency Use Authorization (EUA). This EUA will remain  in effect (meaning this test can be used) for the duration of the COVID-19 declaration under Section 564(b)(1) of the Act, 21 U.S.C.section 360bbb-3(b)(1), unless the authorization is terminated  or revoked sooner.       Influenza A by PCR 01/23/2022 NEGATIVE  NEGATIVE Final   Influenza B by PCR 01/23/2022 NEGATIVE  NEGATIVE Final   Comment: (NOTE) The Xpert Xpress SARS-CoV-2/FLU/RSV plus assay is intended as an aid in the diagnosis of influenza from Nasopharyngeal swab specimens and should not  be used as a sole basis for treatment. Nasal washings and aspirates are unacceptable for Xpert Xpress SARS-CoV-2/FLU/RSV testing.  Fact Sheet for  Patients: EntrepreneurPulse.com.au  Fact Sheet for Healthcare Providers: IncredibleEmployment.be  This test is not yet approved or cleared by the [place] FDA and has been authorized for detection and/or diagnosis of SARS-CoV-2 by FDA under an Emergency Use Authorization (EUA). This EUA will remain in effect (meaning this test can be used) for the duration of the COVID-19 declaration under Section 564(b)(1) of the Act, 21 U.S.C. section 360bbb-3(b)(1), unless the authorization is terminated or revoked.  Performed at [facility], [place] [address], [place], [place] [address]    WBC 01/24/2022 6.1  4.0 - 10.5 K/uL Final   RBC 01/24/2022 4.60  3.87 - 5.11 MIL/uL Final   Hemoglobin 01/24/2022 11.7 (L)  12.0 - 15.0 g/dL Final   HCT 01/24/2022 37.0  36.0 - 46.0 % Final   MCV 01/24/2022 80.4  80.0 - 100.0 fL Final   MCH 01/24/2022 25.4 (L)  26.0 - 34.0 pg Final   MCHC 01/24/2022 31.6  30.0 - 36.0 g/dL Final   RDW 01/24/2022 17.6 (H)  11.5 - 15.5 % Final   Platelets 01/24/2022 334  150 - 400 K/uL Final   nRBC 01/24/2022 0.0  0.0 - 0.2 % Final   Neutrophils Relative % 01/24/2022 53  % Final   Neutro Abs 01/24/2022 3.3  1.7 - 7.7 K/uL Final   Lymphocytes Relative 01/24/2022 33  % Final   Lymphs Abs 01/24/2022 2.0  0.7 - 4.0 K/uL Final   Monocytes Relative 01/24/2022 11  % Final   Monocytes Absolute 01/24/2022 0.7  0.1 - 1.0 K/uL Final   Eosinophils Relative 01/24/2022 2  % Final   Eosinophils Absolute 01/24/2022 0.1  0.0 - 0.5 K/uL Final   Basophils Relative 01/24/2022 1  % Final   Basophils Absolute 01/24/2022 0.1  0.0 - 0.1 K/uL Final   Immature Granulocytes 01/24/2022 0  % Final   Abs Immature Granulocytes 01/24/2022 0.02  0.00 - 0.07 K/uL Final   Performed at [facility], [place] [address], [place], [place] [address]   Glucose-Capillary 01/24/2022 110 (H)  70 - 99 mg/dL Final   Glucose reference range applies only to samples taken  after fasting for at least 8 hours.  No results displayed because visit has over 200 results.    Admission on 11/22/2021, Discharged on 11/22/2021  Component Date Value Ref Range Status   Glucose-Capillary 11/22/2021 169 (H)  70 - 99 mg/dL Final   Glucose reference range applies only to samples taken after fasting for at least 8 hours.  No results displayed because visit has over 200 results.    Admission on 10/04/2021, Discharged on 10/22/2021  Component Date Value Ref Range Status   SARS Coronavirus 2 by RT PCR 10/04/2021 NEGATIVE  NEGATIVE Final   Comment: (NOTE) SARS-CoV-2 target nucleic acids are NOT DETECTED.  The SARS-CoV-2 RNA is generally detectable in upper respiratory specimens during the acute phase of infection. The lowest concentration of SARS-CoV-2 viral copies this assay can detect is 138 copies/mL. A negative result does not preclude SARS-Cov-2 infection and should not be used as the sole basis for treatment or other patient management decisions. A negative result may occur with  improper specimen collection/handling, submission of specimen other than nasopharyngeal swab, presence of viral mutation(s) within the areas targeted by this assay, and inadequate number of viral copies(<138 copies/mL). A negative result  must be combined with clinical observations, patient history, and epidemiological information. The expected result is Negative.  Fact Sheet for Patients:  EntrepreneurPulse.com.au  Fact Sheet for Healthcare Providers:  IncredibleEmployment.be  This test is no                          t yet approved or cleared by the [place] FDA and  has been authorized for detection and/or diagnosis of SARS-CoV-2 by FDA under an Emergency Use Authorization (EUA). This EUA will remain  in effect (meaning this test can be used) for the duration of the COVID-19 declaration under Section 564(b)(1) of the Act, 21 U.S.C.section  360bbb-3(b)(1), unless the authorization is terminated  or revoked sooner.       Influenza A by PCR 10/04/2021 NEGATIVE  NEGATIVE Final   Influenza B by PCR 10/04/2021 NEGATIVE  NEGATIVE Final   Comment: (NOTE) The Xpert Xpress SARS-CoV-2/FLU/RSV plus assay is intended as an aid in the diagnosis of influenza from Nasopharyngeal swab specimens and should not be used as a sole basis for treatment. Nasal washings and aspirates are unacceptable for Xpert Xpress SARS-CoV-2/FLU/RSV testing.  Fact Sheet for Patients: EntrepreneurPulse.com.au  Fact Sheet for Healthcare Providers: IncredibleEmployment.be  This test is not yet approved or cleared by the [place] FDA and has been authorized for detection and/or diagnosis of SARS-CoV-2 by FDA under an Emergency Use Authorization (EUA). This EUA will remain in effect (meaning this test can be used) for the duration of the COVID-19 declaration under Section 564(b)(1) of the Act, 21 U.S.C. section 360bbb-3(b)(1), unless the authorization is terminated or revoked.  Performed at [facility], [place] [address], [place], [place] [address]    WBC 10/04/2021 8.4  4.0 - 10.5 K/uL Final   RBC 10/04/2021 4.42  3.87 - 5.11 MIL/uL Final   Hemoglobin 10/04/2021 11.6 (L)  12.0 - 15.0 g/dL Final   HCT 10/04/2021 35.7 (L)  36.0 - 46.0 % Final   MCV 10/04/2021 80.8  80.0 - 100.0 fL Final   MCH 10/04/2021 26.2  26.0 - 34.0 pg Final   MCHC 10/04/2021 32.5  30.0 - 36.0 g/dL Final   RDW 10/04/2021 16.0 (H)  11.5 - 15.5 % Final   Platelets 10/04/2021 372  150 - 400 K/uL Final   nRBC 10/04/2021 0.0  0.0 - 0.2 % Final   Neutrophils Relative % 10/04/2021 68  % Final   Neutro Abs 10/04/2021 5.7  1.7 - 7.7 K/uL Final   Lymphocytes Relative 10/04/2021 22  % Final   Lymphs Abs 10/04/2021 1.8  0.7 - 4.0 K/uL Final   Monocytes Relative 10/04/2021 8  % Final   Monocytes Absolute 10/04/2021 0.7  0.1 - 1.0 K/uL Final    Eosinophils Relative 10/04/2021 1  % Final   Eosinophils Absolute 10/04/2021 0.1  0.0 - 0.5 K/uL Final   Basophils Relative 10/04/2021 1  % Final   Basophils Absolute 10/04/2021 0.1  0.0 - 0.1 K/uL Final   Immature Granulocytes 10/04/2021 0  % Final   Abs Immature Granulocytes 10/04/2021 0.03  0.00 - 0.07 K/uL Final   Performed at [facility], [place] [address] [place], [place] [address]   Sodium 10/04/2021 136  135 - 145 mmol/L Final   Potassium 10/04/2021 4.2  3.5 - 5.1 mmol/L Final   Chloride 10/04/2021 104  98 - 111 mmol/L Final   CO2 10/04/2021 25  22 - 32 mmol/L Final  Glucose, Bld 10/04/2021 100 (H)  70 - 99 mg/dL Final   Glucose reference range applies only to samples taken after fasting for at least 8 hours.   BUN 10/04/2021 9  6 - 20 mg/dL Final   Creatinine, Ser 10/04/2021 0.52  0.44 - 1.00 mg/dL Final   Calcium 10/04/2021 9.0  8.9 - 10.3 mg/dL Final   Total Protein 10/04/2021 7.0  6.5 - 8.1 g/dL Final   Albumin 10/04/2021 3.2 (L)  3.5 - 5.0 g/dL Final   AST 10/04/2021 13 (L)  15 - 41 U/L Final   ALT 10/04/2021 10  0 - 44 U/L Final   Alkaline Phosphatase 10/04/2021 61  38 - 126 U/L Final   Total Bilirubin 10/04/2021 0.3  0.3 - 1.2 mg/dL Final   GFR, Estimated 10/04/2021 >60  >60 mL/min Final   Comment: (NOTE) Calculated using the CKD-EPI Creatinine Equation (2021)    Anion gap 10/04/2021 7  5 - 15 Final   Performed at [place] [address] [place], [place] [address]   Hgb A1c MFr Bld 10/04/2021 7.0 (H)  4.8 - 5.6 % Final   Comment: (NOTE) Pre diabetes:          5.7%-6.4%  Diabetes:              >6.4%  Glycemic control for   <7.0% adults with diabetes    Mean Plasma Glucose 10/04/2021 154.2  mg/dL Final   Performed at [facility], [place] [address], [place], [place] [address]   Cholesterol 10/04/2021 178  0 - 200 mg/dL Final   Triglycerides 10/04/2021 155 (H)  <150 mg/dL Final   HDL 10/04/2021 52  >40 mg/dL Final   Total CHOL/HDL Ratio  10/04/2021 3.4  RATIO Final   VLDL 10/04/2021 31  0 - 40 mg/dL Final   LDL Cholesterol 10/04/2021 95  0 - 99 mg/dL Final   Comment:        Total Cholesterol/HDL:CHD Risk Coronary Heart Disease Risk Table                     Men   Women  1/2 Average Risk   3.4   3.3  Average Risk       5.0   4.4  2 X Average Risk   9.6   7.1  3 X Average Risk  23.4   11.0        Use the calculated Patient Ratio above and the CHD Risk Table to determine the patient's CHD Risk.        ATP III CLASSIFICATION (LDL):  <100     mg/dL   Optimal  100-129  mg/dL   Near or Above                    Optimal  130-159  mg/dL   Borderline  160-189  mg/dL   High  >190     mg/dL   Very High Performed at [place] [address], [place], [place] [address]    POC Amphetamine UR 10/04/2021 None Detected  NONE DETECTED (Cut Off Level 1000 ng/mL) Final   POC Secobarbital (BAR) 10/04/2021 None Detected  NONE DETECTED (Cut Off Level 300 ng/mL) Final   POC Buprenorphine (BUP) 10/04/2021 None Detected  NONE DETECTED (Cut Off Level 10 ng/mL) Final   POC Oxazepam (BZO) 10/04/2021 None Detected  NONE DETECTED (Cut Off Level 300 ng/mL) Final   POC Cocaine UR  10/04/2021 None Detected  NONE DETECTED (Cut Off Level 300 ng/mL) Final   POC Methamphetamine UR 10/04/2021 None Detected  NONE DETECTED (Cut Off Level 1000 ng/mL) Final   POC Morphine 10/04/2021 None Detected  NONE DETECTED (Cut Off Level 300 ng/mL) Final   POC Methadone UR 10/04/2021 None Detected  NONE DETECTED (Cut Off Level 300 ng/mL) Final   POC Oxycodone UR 10/04/2021 Positive (A)  NONE DETECTED (Cut Off Level 100 ng/mL) Final   POC Marijuana UR 10/04/2021 None Detected  NONE DETECTED (Cut Off Level 50 ng/mL) Final   SARSCOV2ONAVIRUS 2 AG 10/04/2021 NEGATIVE  NEGATIVE Final   Comment: (NOTE) SARS-CoV-2 antigen NOT DETECTED.   Negative results are presumptive.  Negative results do not preclude SARS-CoV-2 infection and should not be used as the sole  basis for treatment or other patient management decisions, including infection  control decisions, particularly in the presence of clinical signs and  symptoms consistent with COVID-19, or in those who have been in contact with the virus.  Negative results must be combined with clinical observations, patient history, and epidemiological information. The expected result is Negative.  Fact Sheet for Patients: HandmadeRecipes.com.cy  Fact Sheet for Healthcare Providers: FuneralLife.at  This test is not yet approved or cleared by the [place] FDA and  has been authorized for detection and/or diagnosis of SARS-CoV-2 by FDA under an Emergency Use Authorization (EUA).  This EUA will remain in effect (meaning this test can be used) for the duration of  the COV                          ID-19 declaration under Section 564(b)(1) of the Act, 21 U.S.C. section 360bbb-3(b)(1), unless the authorization is terminated or revoked sooner.     Valproic Acid Lvl 10/04/2021 51  50.0 - 100.0 ug/mL Final   Performed at [place] [address], [place], [place] [address]   Valproic Acid Lvl 10/08/2021 57  50.0 - 100.0 ug/mL Final   Performed at [facility] [address], [place], [place] [address]   Glucose-Capillary 10/09/2021 123 (H)  70 - 99 mg/dL Final   Glucose reference range applies only to samples taken after fasting for at least 8 hours.  There may be more visits with results that are not included.    Blood Alcohol level:  Lab Results  Component Value Date   ETH <10 11/23/2021   ETH <10 84/16/6063    Metabolic Disorder Labs: Lab Results  Component Value Date   HGBA1C 6.7 (H) 12/28/2021   MPG 145.59 12/28/2021   MPG 154.2 10/04/2021   Lab Results  Component Value Date   PROLACTIN 3.8 (L) 11/23/2021   Lab Results  Component Value Date   CHOL 171 11/23/2021   TRIG 125 11/23/2021   HDL 65 11/23/2021   CHOLHDL  2.6 11/23/2021   VLDL 25 11/23/2021   LDLCALC 81 11/23/2021   LDLCALC 95 10/04/2021    Therapeutic Lab Levels: No results found for: "LITHIUM" Lab Results  Component Value Date   VALPROATE 17 (L) 02/17/2022   VALPROATE 33 (L) 01/18/2022   No results found for: "CBMZ"  Physical Findings   PHQ2-9    Flowsheet Row ED from 11/23/2021 in [facility]  PHQ-2 Total Score 2  PHQ-9 Total Score 3      Flowsheet Row ED from 02/18/2022 in [facility] ED from 02/17/2022 in [facility] ED from 02/09/2022 in [facility] No Risk No Risk No Risk        Musculoskeletal  Strength & Muscle Tone: within normal limits Gait & Station: normal Patient leans: N/A  Psychiatric Specialty Exam  Presentation  General Appearance:  Appropriate for Environment; Casual  Eye Contact: Good  Speech: Clear and Coherent; Normal Rate  Speech Volume: Normal  Handedness: Right   Mood and Affect  Mood: Euthymic  Affect: Congruent   Thought Process  Thought Processes: Coherent  Descriptions of Associations:Intact  Orientation:Full (Time, Place and Person)  Thought Content:Logical  Diagnosis of Schizophrenia or Schizoaffective disorder in past: Yes  Duration of Psychotic Symptoms: No data recorded  Hallucinations:Hallucinations: None  Ideas of Reference:None  Suicidal Thoughts:Suicidal Thoughts: No  Homicidal Thoughts:Homicidal Thoughts: No   Sensorium  Memory: Immediate Good; Recent Good; Remote Good  Judgment: Fair  Insight: Fair   Community education officer  Concentration: Good  Attention Span: Good  Recall: Good  Fund of Knowledge: Good  Language: Good   Psychomotor Activity  Psychomotor Activity: Psychomotor Activity: Normal   Assets  Assets: Physical Health; Resilience; Social Support   Sleep   Sleep: Sleep: Good   No data recorded  Physical Exam  Physical Exam Vitals and nursing note reviewed.  Constitutional:      General: She is not in acute distress.    Appearance: Normal appearance. She is not ill-appearing.  HENT:     Head: Normocephalic.  Eyes:     General:        Right eye: No discharge.        Left eye: No discharge.     Conjunctiva/sclera: Conjunctivae normal.  Cardiovascular:     Rate and Rhythm: Normal rate.  Pulmonary:     Effort: Pulmonary effort is normal. No respiratory distress.  Musculoskeletal:        General: Normal range of motion.     Cervical back: Normal range of motion.  Skin:    Coloration: Skin is not jaundiced or pale.  Neurological:     Mental Status: She is alert and oriented to person, place, and time.  Psychiatric:        Attention and Perception: Attention and perception normal.        Mood and Affect: Mood and affect normal.        Speech: Speech normal.        Behavior: Behavior normal. Behavior is cooperative.        Thought Content: Thought content normal.        Cognition and Memory: Cognition normal.        Judgment: Judgment normal.    Review of Systems  Constitutional: Negative.   HENT: Negative.    Eyes: Negative.   Respiratory: Negative.  Negative for cough and shortness of breath.   Cardiovascular: Negative.  Negative for chest pain.  Musculoskeletal: Negative.   Skin: Negative.   Neurological: Negative.   Psychiatric/Behavioral: Negative.     Blood pressure (!) 140/76, pulse 94, temperature 98 F (36.7 C), temperature source Oral, resp. rate 19, SpO2 97 %. There is no height or weight on file to calculate BMI.  Treatment Plan Summary:  Pt remains psychiatrically cleared. Disposition is pending placement.   Revonda Humphrey, NP 02/21/2022 5:11 PM

## 2022-02-21 NOTE — ED Notes (Signed)
Notified provider that patient states that her throat  is hurting's.Will continue to monitor  for safety.

## 2022-02-22 DIAGNOSIS — F209 Schizophrenia, unspecified: Secondary | ICD-10-CM | POA: Diagnosis not present

## 2022-02-22 LAB — GLUCOSE, CAPILLARY
Glucose-Capillary: 131 mg/dL — ABNORMAL HIGH (ref 70–99)
Glucose-Capillary: 141 mg/dL — ABNORMAL HIGH (ref 70–99)
Glucose-Capillary: 183 mg/dL — ABNORMAL HIGH (ref 70–99)

## 2022-02-22 NOTE — ED Notes (Signed)
Pt is in the bed resting. Respirations are even and unlabored. No acute distress noted. Will continue to monitor for safety 

## 2022-02-22 NOTE — ED Notes (Signed)
Pt sleeping@this time. Breathing even and unlabored. Will continue to monitor for safety 

## 2022-02-22 NOTE — ED Notes (Signed)
Pt awake, resting quietly on recliner bed. Resp even and unlabored. No noted distress. Will continue to monitor for safety

## 2022-02-22 NOTE — ED Notes (Signed)
Pt a/o  Denies SI/HI/AVH. No noted resp distress. Pt is intrusive this morning. Requires redirection. Will continue to monitor for safety

## 2022-02-22 NOTE — ED Provider Notes (Signed)
Behavioral Health Progress Note  Date and Time: 02/22/2022 10:22 AM Name: Teresa Murillo MRN:  [id number]  Subjective:   Teresa Murillo is a 61 y.o. female with a past psychiatric history of schizophrenia, aggressive behavior, and possible intellectual disability presenting to [facility] on 11/23/21 voluntarily as a walk in via [facility] with complaints that she was locked out of the group home. Patient has been dismissed from group home due to multiple elopements. Pt had already been discharged from this facility but had been living there temporarily while DSS was looking for new placement. Pt is boarding at [facility].   AID to Capacity Evaluation (ACE) completed by Dr. Louis Meckel, Dr. Dwyane Dee on 12/11/2021.  Please see media tab for full details.    [name], PhD completed: Wechsler Adult Intelligence Scale-4, [name] achieved a full-scale IQ score of 73 and a percentile rank of 4 placing her in the borderline range of intellectual functioning (12/14/2021) Please see consult note from [name], PhD on 12/14/2021.  On reassessment today, pt reports feeling sad that it is Christmas and she is without family. She verbalizes intent not to think too much about this, will keep herself busy. She denies suicidal, homicidal, or violent ideations. She denies auditory visual hallucinations or paranoia. There is no evidence of agitation, aggression, distractibility or internal preoccupation. No delusions or paranoia elicited. Pt is calm, cooperative, pleasant. Pt remains psychiatrically cleared. Disposition is pending placement. Per chart review, possible placement at [facility] [place].   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 On the Wechsler Adult Intelligence Scale-4, [name] achieved a full-scale IQ score of 73 and a percentile rank of 4 placing her in the borderline range of intellectual functioning.    Chronic obstructive pulmonary disease (COPD) (HCC)    Glaucoma    Hyperlipidemia    Hypertension    Iron deficiency    Schizoaffective disorder (HCC)    Type 2 diabetes mellitus (HCC)     Past Surgical History:  Procedure Laterality Date   TUBAL LIGATION     Family History:  Family History  Problem Relation Age of Onset   Breast cancer Maternal Grandmother    Family Psychiatric  History: None reported Social History:  Social History   Substance and Sexual Activity  Alcohol Use Not Currently     Social History   Substance and Sexual Activity  Drug Use Not Currently    Social History   Socioeconomic History   Marital status: Divorced    Spouse name: Not on file   Number of children: Not on file   Years of education: Not on file   Highest education level: Not on file  Occupational History   Not on file  Tobacco Use   Smoking status: Former    Packs/day: 1.00    Types: Cigars, Cigarettes    Quit date: 11/09/2021    Years since quitting: 0.2   Smokeless tobacco: Current  Vaping Use   Vaping Use: Never used  Substance and Sexual Activity   Alcohol use: Not Currently   Drug use: Not Currently   Sexual activity: Not Currently    Birth control/protection: Surgical  Other Topics Concern   Not on file  Social History Narrative  Not on file   Social Determinants of Health   Financial Resource Strain: Not on file  Food Insecurity: Not on file  Transportation Needs: Not on file  Physical Activity: Not on file  Stress: Not on file  Social Connections: Not on file   SDOH:  SDOH Screenings   Depression (PHQ2-9): Low Risk  (11/23/2021)  Tobacco Use: High Risk (02/17/2022)   Additional Social History:                         Sleep:  Good  Appetite:  Good  Current Medications:  Current Facility-Administered Medications  Medication Dose Route Frequency Provider Last Rate Last Admin   acetaminophen (TYLENOL) tablet 650 mg  650 mg Oral Q6H PRN [name], NP   650 mg at 02/21/22 2033   albuterol (VENTOLIN HFA) 108 (90 Base) MCG/ACT inhaler 2 puff  2 puff Inhalation Q6H PRN [name], NP   2 puff at 02/20/22 1944   alum & mag hydroxide-simeth (MAALOX/MYLANTA) 200-200-20 MG/5ML suspension 30 mL  30 mL Oral Q4H PRN [name], NP   30 mL at 02/21/22 2051   apixaban (ELIQUIS) tablet 5 mg  5 mg Oral BID [name], NP   5 mg at 02/22/22 0929   ARIPiprazole (ABILIFY) tablet 10 mg  10 mg Oral Daily [name], NP   10 mg at 02/22/22 0929   cloZAPine (CLOZARIL) tablet 75 mg  75 mg Oral BID France Ravens, MD   75 mg at 02/22/22 1610   colchicine tablet 0.6 mg  0.6 mg Oral QHS [name], NP   0.6 mg at 02/21/22 2033   diltiazem (CARDIZEM CD) 24 hr capsule 240 mg  240 mg Oral Daily [name], NP   240 mg at 02/22/22 0930   divalproex (DEPAKOTE ER) 24 hr tablet 500 mg  500 mg Oral BID [name], NP   500 mg at 02/22/22 9604   haloperidol (HALDOL) tablet 10 mg  10 mg Oral TID PRN [name], NP       insulin aspart (novoLOG) injection 0-9 Units  0-9 Units Subcutaneous TID WC France Ravens, MD   1 Units at 02/22/22 0759   insulin glargine-yfgn (SEMGLEE) injection 12 Units  12 Units Subcutaneous Daily [name], NP   12 Units at 02/22/22 0938   latanoprost (XALATAN) 0.005 % ophthalmic solution 1 drop  1 drop Both Eyes QHS [name], NP   1 drop at 02/18/22 2114   magnesium hydroxide (MILK OF MAGNESIA) suspension 30 mL  30 mL Oral Daily PRN [name], NP       menthol-cetylpyridinium (CEPACOL) lozenge 3 mg  1 lozenge Oral Q4H PRN Revonda Humphrey, NP       metFORMIN (GLUCOPHAGE) tablet 500 mg  500 mg Oral BID WC [name], NP   500 mg at 02/22/22 0756   mometasone-formoterol (DULERA) 200-5 MCG/ACT  inhaler 2 puff  2 puff Inhalation BID [name], NP   2 puff at 02/22/22 0756   nicotine (NICODERM CQ - dosed in mg/24 hr) patch 7 mg  7 mg Transdermal Daily [name], NP   7 mg at 02/22/22 0930   valbenazine (INGREZZA) capsule 40 mg  40 mg Oral q AM [name], NP   40 mg at 02/22/22 5409   Vitamin D (Ergocalciferol) (DRISDOL) 1.25 MG (50000 UNIT) capsule 50,000 Units  50,000 Units Oral Q Leonie Green, NP   50,000 Units at 02/18/22 [id number]  Current Outpatient Medications  Medication Sig Dispense Refill   Accu-Chek Softclix Lancets lancets Use as directed up to four times daily 100 each 0   apixaban (ELIQUIS) 5 MG TABS tablet Take 1 tablet (5 mg total) by mouth 2 (two) times daily. 60 tablet 0   ARIPiprazole (ABILIFY) 10 MG tablet Take 1 tablet (10 mg total) by mouth daily. 30 tablet 0   Blood Glucose Monitoring Suppl (ACCU-CHEK GUIDE) w/Device KIT Use as directed up to four times daily 1 kit 0   budesonide-formoterol (SYMBICORT) 160-4.5 MCG/ACT inhaler Inhale 2 puffs into the lungs in the morning and at bedtime.     Cholecalciferol (VITAMIN D3) 1.25 MG (50000 UT) CAPS Take 50,000 Units by mouth every Thursday.     clozapine (CLOZARIL) 50 MG tablet Take 1 tablet (50 mg total) by mouth daily. 30 tablet 0   colchicine 0.6 MG tablet Take 1 tablet (0.6 mg total) by mouth at bedtime. 30 tablet 0   diltiazem (CARDIZEM CD) 240 MG 24 hr capsule Take 1 capsule (240 mg total) by mouth daily. (Patient not taking: Reported on 11/24/2021) 30 capsule 0   divalproex (DEPAKOTE ER) 500 MG 24 hr tablet Take 1 tablet (500 mg total) by mouth 2 (two) times daily. 60 tablet 0   glucose blood test strip Use as directed up to four times daily 50 each 0   haloperidol (HALDOL) 10 MG tablet Take 1 tablet (10 mg total) by mouth 3 (three) times daily as needed for agitation (and psychotic symptoms).     INGREZZA 40 MG capsule Take 1 capsule (40 mg total) by mouth in the morning. 30 capsule 0   insulin aspart  (NOVOLOG) 100 UNIT/ML FlexPen Before each meal 3 times a day, 140-199 - 2 units, 200-250 - 4 units, 251-299 - 6 units,  300-349 - 8 units,  350 or above 10 units. Insulin PEN if approved, provide syringes and needles if needed.Please switch to any approved short acting Insulin if needed. 15 mL 0   insulin glargine (LANTUS) 100 UNIT/ML Solostar Pen Inject 12 Units into the skin daily. 15 mL 0   Insulin Pen Needle 32G X 4 MM MISC Use 4 times a day with insulin, 1 month supply. 100 each 0   latanoprost (XALATAN) 0.005 % ophthalmic solution Place 1 drop into both eyes at bedtime.     metFORMIN (GLUCOPHAGE) 500 MG tablet Take 500 mg by mouth 2 (two) times daily with a meal.     nicotine (NICODERM CQ - DOSED IN MG/24 HR) 7 mg/24hr patch Place 1 patch (7 mg total) onto the skin daily. 28 patch 0   PROAIR HFA 108 (90 Base) MCG/ACT inhaler Inhale 2 puffs into the lungs every 6 (six) hours as needed for wheezing or shortness of breath.      Labs  Lab Results:  Admission on 02/18/2022  Component Date Value Ref Range Status   Glucose-Capillary 02/18/2022 254 (H)  70 - 99 mg/dL Final   Glucose reference range applies only to samples taken after fasting for at least 8 hours.   Glucose-Capillary 02/18/2022 112 (H)  70 - 99 mg/dL Final   Glucose reference range applies only to samples taken after fasting for at least 8 hours.   Glucose-Capillary 02/19/2022 159 (H)  70 - 99 mg/dL Final   Glucose reference range applies only to samples taken after fasting for at least 8 hours.   Glucose-Capillary 02/19/2022 160 (H)  70 - 99 mg/dL Final  Glucose reference range applies only to samples taken after fasting for at least 8 hours.   Glucose-Capillary 02/19/2022 177 (H)  70 - 99 mg/dL Final   Glucose reference range applies only to samples taken after fasting for at least 8 hours.   Glucose-Capillary 02/19/2022 147 (H)  70 - 99 mg/dL Final   Glucose reference range applies only to samples taken after fasting for at  least 8 hours.   Glucose-Capillary 02/20/2022 126 (H)  70 - 99 mg/dL Final   Glucose reference range applies only to samples taken after fasting for at least 8 hours.   Glucose-Capillary 02/20/2022 132 (H)  70 - 99 mg/dL Final   Glucose reference range applies only to samples taken after fasting for at least 8 hours.   Glucose-Capillary 02/20/2022 125 (H)  70 - 99 mg/dL Final   Glucose reference range applies only to samples taken after fasting for at least 8 hours.   Glucose-Capillary 02/21/2022 151 (H)  70 - 99 mg/dL Final   Glucose reference range applies only to samples taken after fasting for at least 8 hours.   Glucose-Capillary 02/21/2022 106 (H)  70 - 99 mg/dL Final   Glucose reference range applies only to samples taken after fasting for at least 8 hours.   Glucose-Capillary 02/21/2022 149 (H)  70 - 99 mg/dL Final   Glucose reference range applies only to samples taken after fasting for at least 8 hours.   Glucose-Capillary 02/22/2022 131 (H)  70 - 99 mg/dL Final   Glucose reference range applies only to samples taken after fasting for at least 8 hours.  Admission on 02/17/2022, Discharged on 02/18/2022  Component Date Value Ref Range Status   Sodium 02/17/2022 136  135 - 145 mmol/L Final   Potassium 02/17/2022 3.9  3.5 - 5.1 mmol/L Final   Chloride 02/17/2022 101  98 - 111 mmol/L Final   CO2 02/17/2022 25  22 - 32 mmol/L Final   Glucose, Bld 02/17/2022 113 (H)  70 - 99 mg/dL Final   Glucose reference range applies only to samples taken after fasting for at least 8 hours.   BUN 02/17/2022 9  6 - 20 mg/dL Final   Creatinine, Ser 02/17/2022 0.50  0.44 - 1.00 mg/dL Final   Calcium 02/17/2022 8.9  8.9 - 10.3 mg/dL Final   GFR, Estimated 02/17/2022 >60  >60 mL/min Final   Comment: (NOTE) Calculated using the CKD-EPI Creatinine Equation (2021)    Anion gap 02/17/2022 10  5 - 15 Final   Performed at [facility], [place] [address], [place], [place] [address]   WBC 02/17/2022 6.5   4.0 - 10.5 K/uL Final   RBC 02/17/2022 4.24  3.87 - 5.11 MIL/uL Final   Hemoglobin 02/17/2022 10.2 (L)  12.0 - 15.0 g/dL Final   HCT 02/17/2022 33.2 (L)  36.0 - 46.0 % Final   MCV 02/17/2022 78.3 (L)  80.0 - 100.0 fL Final   MCH 02/17/2022 24.1 (L)  26.0 - 34.0 pg Final   MCHC 02/17/2022 30.7  30.0 - 36.0 g/dL Final   RDW 02/17/2022 17.2 (H)  11.5 - 15.5 % Final   Platelets 02/17/2022 390  150 - 400 K/uL Final   nRBC 02/17/2022 0.0  0.0 - 0.2 % Final   Performed at [facility], [place] [address], [place], [place] [address]   Troponin I (High Sensitivity) 02/17/2022 4  <18 ng/L Final   Comment: (NOTE) Elevated high sensitivity troponin I (hsTnI)  values and significant  changes across serial measurements may suggest ACS but many other  chronic and acute conditions are known to elevate hsTnI results.  Refer to the "Links" section for chest pain algorithms and additional  guidance. Performed at [facility], [place] [address], [place], [place] [address]    Valproic Acid Lvl 02/17/2022 17 (L)  50.0 - 100.0 ug/mL Final   Performed at [facility] [address] [place], [place] [address]   B Natriuretic Peptide 02/17/2022 30.9  0.0 - 100.0 pg/mL Final   Performed at [facility] [address] [place], [place] [address]   Troponin I (High Sensitivity) 02/18/2022 4  <18 ng/L Final   Comment: (NOTE) Elevated high sensitivity troponin I (hsTnI) values and significant  changes across serial measurements may suggest ACS but many other  chronic and acute conditions are known to elevate hsTnI results.  Refer to the "Links" section for chest pain algorithms and additional  guidance. Performed at [facility], [place] [address], [place], [place] [address]    D-Dimer, Quant 02/18/2022 0.29  0.00 - 0.50 ug/mL-FEU Final   Comment: (NOTE) At the manufacturer cut-off value of 0.5 g/mL FEU, this assay has a negative predictive value of 95-100%.This assay is intended for  use in conjunction with a clinical pretest probability (PTP) assessment model to exclude pulmonary embolism (PE) and deep venous thrombosis (DVT) in outpatients suspected of PE or DVT. Results should be correlated with clinical presentation. Performed at [facility], [place] [address], [place],  [address]   Admission on 02/09/2022, Discharged on 02/17/2022  Component Date Value Ref Range Status   Glucose-Capillary 02/09/2022 118 (H)  70 - 99 mg/dL Final   Glucose reference range applies only to samples taken after fasting for at least 8 hours.   Glucose-Capillary 02/10/2022 148 (H)  70 - 99 mg/dL Final   Glucose reference range applies only to samples taken after fasting for at least 8 hours.   Glucose-Capillary 02/10/2022 174 (H)  70 - 99 mg/dL Final   Glucose reference range applies only to samples taken after fasting for at least 8 hours.   Glucose-Capillary 02/10/2022 128 (H)  70 - 99 mg/dL Final   Glucose reference range applies only to samples taken after fasting for at least 8 hours.   Glucose-Capillary 02/10/2022 182 (H)  70 - 99 mg/dL Final   Glucose reference range applies only to samples taken after fasting for at least 8 hours.   Glucose-Capillary 02/11/2022 158 (H)  70 - 99 mg/dL Final   Glucose reference range applies only to samples taken after fasting for at least 8 hours.   Glucose-Capillary 02/11/2022 159 (H)  70 - 99 mg/dL Final   Glucose reference range applies only to samples taken after fasting for at least 8 hours.   Glucose-Capillary 02/11/2022 153 (H)  70 - 99 mg/dL Final   Glucose reference range applies only to samples taken after fasting for at least 8 hours.   Glucose-Capillary 02/11/2022 159 (H)  70 - 99 mg/dL Final   Glucose reference range applies only to samples taken after fasting for at least 8 hours.   Glucose-Capillary 02/11/2022 153 (H)  70 - 99 mg/dL Final   Glucose reference range applies only to samples taken after fasting for at least  8 hours.   Glucose-Capillary 02/11/2022 109 (H)  70 - 99 mg/dL Final   Glucose reference range applies only to samples taken  after fasting for at least 8 hours.   Glucose-Capillary 02/11/2022 213 (H)  70 - 99 mg/dL Final   Glucose reference range applies only to samples taken after fasting for at least 8 hours.   Glucose-Capillary 02/11/2022 229 (H)  70 - 99 mg/dL Final   Glucose reference range applies only to samples taken after fasting for at least 8 hours.   Glucose-Capillary 02/12/2022 128 (H)  70 - 99 mg/dL Final   Glucose reference range applies only to samples taken after fasting for at least 8 hours.   Glucose-Capillary 02/12/2022 149 (H)  70 - 99 mg/dL Final   Glucose reference range applies only to samples taken after fasting for at least 8 hours.   Color, Urine 02/12/2022 YELLOW  YELLOW Final   APPearance 02/12/2022 CLEAR  CLEAR Final   Specific Gravity, Urine 02/12/2022 1.015  1.005 - 1.030 Final   pH 02/12/2022 5.0  5.0 - 8.0 Final   Glucose, UA 02/12/2022 NEGATIVE  NEGATIVE mg/dL Final   Hgb urine dipstick 02/12/2022 NEGATIVE  NEGATIVE Final   Bilirubin Urine 02/12/2022 NEGATIVE  NEGATIVE Final   Ketones, ur 02/12/2022 NEGATIVE  NEGATIVE mg/dL Final   Protein, ur 02/12/2022 NEGATIVE  NEGATIVE mg/dL Final   Nitrite 02/12/2022 NEGATIVE  NEGATIVE Final   Leukocytes,Ua 02/12/2022 NEGATIVE  NEGATIVE Final   Performed at [facility], [place] [address] [place], [place] [address]   Specimen Description 02/12/2022 URINE, CLEAN CATCH   Final   Special Requests 02/12/2022    Final                   Value:NONE Performed at [facility], [place] [address], [place], [place] [address]    Culture 02/12/2022 10,000 COLONIES/mL PROTEUS MIRABILIS (A)   Final   Report Status 02/12/2022 02/15/2022 FINAL   Final   Organism ID, Bacteria 02/12/2022 PROTEUS MIRABILIS (A)   Final   Glucose-Capillary 02/12/2022 128 (H)  70 - 99 mg/dL Final   Glucose reference range applies only to samples  taken after fasting for at least 8 hours.   Glucose-Capillary 02/12/2022 196 (H)  70 - 99 mg/dL Final   Glucose reference range applies only to samples taken after fasting for at least 8 hours.   Glucose-Capillary 02/13/2022 168 (H)  70 - 99 mg/dL Final   Glucose reference range applies only to samples taken after fasting for at least 8 hours.   Glucose-Capillary 02/13/2022 153 (H)  70 - 99 mg/dL Final   Glucose reference range applies only to samples taken after fasting for at least 8 hours.   Glucose-Capillary 02/13/2022 134 (H)  70 - 99 mg/dL Final   Glucose reference range applies only to samples taken after fasting for at least 8 hours.   Glucose-Capillary 02/13/2022 178 (H)  70 - 99 mg/dL Final   Glucose reference range applies only to samples taken after fasting for at least 8 hours.   Glucose-Capillary 02/14/2022 156 (H)  70 - 99 mg/dL Final   Glucose reference range applies only to samples taken after fasting for at least 8 hours.   Glucose-Capillary 02/14/2022 169 (H)  70 - 99 mg/dL Final   Glucose reference range applies only to samples taken after fasting for at least 8 hours.   Glucose-Capillary 02/14/2022 156 (H)  70 - 99 mg/dL Final   Glucose reference range applies only to samples taken after fasting for at least 8 hours.   Glucose-Capillary 02/14/2022 148 (H)  70 -  99 mg/dL Final   Glucose reference range applies only to samples taken after fasting for at least 8 hours.   Glucose-Capillary 02/15/2022 159 (H)  70 - 99 mg/dL Final   Glucose reference range applies only to samples taken after fasting for at least 8 hours.   Glucose-Capillary 02/15/2022 125 (H)  70 - 99 mg/dL Final   Glucose reference range applies only to samples taken after fasting for at least 8 hours.   Glucose-Capillary 02/15/2022 142 (H)  70 - 99 mg/dL Final   Glucose reference range applies only to samples taken after fasting for at least 8 hours.   Glucose-Capillary 02/15/2022 128 (H)  70 - 99 mg/dL  Final   Glucose reference range applies only to samples taken after fasting for at least 8 hours.   Glucose-Capillary 02/16/2022 137 (H)  70 - 99 mg/dL Final   Glucose reference range applies only to samples taken after fasting for at least 8 hours.   WBC 02/16/2022 6.0  4.0 - 10.5 K/uL Final   RBC 02/16/2022 4.48  3.87 - 5.11 MIL/uL Final   Hemoglobin 02/16/2022 10.7 (L)  12.0 - 15.0 g/dL Final   HCT 02/16/2022 35.4 (L)  36.0 - 46.0 % Final   MCV 02/16/2022 79.0 (L)  80.0 - 100.0 fL Final   MCH 02/16/2022 23.9 (L)  26.0 - 34.0 pg Final   MCHC 02/16/2022 30.2  30.0 - 36.0 g/dL Final   RDW 02/16/2022 17.0 (H)  11.5 - 15.5 % Final   Platelets 02/16/2022 387  150 - 400 K/uL Final   nRBC 02/16/2022 0.0  0.0 - 0.2 % Final   Neutrophils Relative % 02/16/2022 54  % Final   Neutro Abs 02/16/2022 3.2  1.7 - 7.7 K/uL Final   Lymphocytes Relative 02/16/2022 35  % Final   Lymphs Abs 02/16/2022 2.1  0.7 - 4.0 K/uL Final   Monocytes Relative 02/16/2022 6  % Final   Monocytes Absolute 02/16/2022 0.4  0.1 - 1.0 K/uL Final   Eosinophils Relative 02/16/2022 2  % Final   Eosinophils Absolute 02/16/2022 0.1  0.0 - 0.5 K/uL Final   Basophils Relative 02/16/2022 2  % Final   Basophils Absolute 02/16/2022 0.1  0.0 - 0.1 K/uL Final   Immature Granulocytes 02/16/2022 1  % Final   Abs Immature Granulocytes 02/16/2022 0.04  0.00 - 0.07 K/uL Final   Performed at [facility], [place] [address] [place], [place] [address]   Glucose-Capillary 02/16/2022 131 (H)  70 - 99 mg/dL Final   Glucose reference range applies only to samples taken after fasting for at least 8 hours.   Glucose-Capillary 02/16/2022 123 (H)  70 - 99 mg/dL Final   Glucose reference range applies only to samples taken after fasting for at least 8 hours.   Glucose-Capillary 02/16/2022 195 (H)  70 - 99 mg/dL Final   Glucose reference range applies only to samples taken after fasting for at least 8 hours.   Glucose-Capillary 02/17/2022 148 (H)   70 - 99 mg/dL Final   Glucose reference range applies only to samples taken after fasting for at least 8 hours.  Admission on 02/08/2022, Discharged on 02/09/2022  Component Date Value Ref Range Status   B Natriuretic Peptide 02/08/2022 40.2  0.0 - 100.0 pg/mL Final   Performed at [facility], [address] [place], [place] [address]   Troponin I (High Sensitivity) 02/08/2022 4  <18 ng/L Final   Comment: (NOTE) Elevated  high sensitivity troponin I (hsTnI) values and significant  changes across serial measurements may suggest ACS but many other  chronic and acute conditions are known to elevate hsTnI results.  Refer to the "Links" section for chest pain algorithms and additional  guidance. Performed at [facility], [place] [address] [place], [place] [address]    WBC 02/08/2022 8.5  4.0 - 10.5 K/uL Final   RBC 02/08/2022 4.30  3.87 - 5.11 MIL/uL Final   Hemoglobin 02/08/2022 10.7 (L)  12.0 - 15.0 g/dL Final   HCT 02/08/2022 33.1 (L)  36.0 - 46.0 % Final   MCV 02/08/2022 77.0 (L)  80.0 - 100.0 fL Final   MCH 02/08/2022 24.9 (L)  26.0 - 34.0 pg Final   MCHC 02/08/2022 32.3  30.0 - 36.0 g/dL Final   RDW 02/08/2022 16.5 (H)  11.5 - 15.5 % Final   Platelets 02/08/2022 361  150 - 400 K/uL Final   nRBC 02/08/2022 0.0  0.0 - 0.2 % Final   Performed at [facility] [address] [place], [place] [address]   Sodium 02/08/2022 131 (L)  135 - 145 mmol/L Final   Potassium 02/08/2022 4.4  3.5 - 5.1 mmol/L Final   Chloride 02/08/2022 96 (L)  98 - 111 mmol/L Final   CO2 02/08/2022 25  22 - 32 mmol/L Final   Glucose, Bld 02/08/2022 126 (H)  70 - 99 mg/dL Final   Glucose reference range applies only to samples taken after fasting for at least 8 hours.   BUN 02/08/2022 11  6 - 20 mg/dL Final   Creatinine, Ser 02/08/2022 0.52  0.44 - 1.00 mg/dL Final   Calcium 02/08/2022 9.4  8.9 - 10.3 mg/dL Final   Total Protein 02/08/2022 7.5  6.5 - 8.1 g/dL Final   Albumin 02/08/2022 3.6   3.5 - 5.0 g/dL Final   AST 02/08/2022 22  15 - 41 U/L Final   ALT 02/08/2022 20  0 - 44 U/L Final   Alkaline Phosphatase 02/08/2022 67  38 - 126 U/L Final   Total Bilirubin 02/08/2022 0.1 (L)  0.3 - 1.2 mg/dL Final   GFR, Estimated 02/08/2022 >60  >60 mL/min Final   Comment: (NOTE) Calculated using the CKD-EPI Creatinine Equation (2021)    Anion gap 02/08/2022 10  5 - 15 Final   Performed at [facility] [address], [place], [place] [address]   Troponin I (High Sensitivity) 02/08/2022 5  <18 ng/L Final   Comment: (NOTE) Elevated high sensitivity troponin I (hsTnI) values and significant  changes across serial measurements may suggest ACS but many other  chronic and acute conditions are known to elevate hsTnI results.  Refer to the "Links" section for chest pain algorithms and additional  guidance. Performed at [facility], [place] [address], [place], [place] [address]    WBC 02/09/2022 7.3  4.0 - 10.5 K/uL Final   RBC 02/09/2022 4.70  3.87 - 5.11 MIL/uL Final   Hemoglobin 02/09/2022 11.4 (L)  12.0 - 15.0 g/dL Final   HCT 02/09/2022 36.6  36.0 - 46.0 % Final   MCV 02/09/2022 77.9 (L)  80.0 - 100.0 fL Final   MCH 02/09/2022 24.3 (L)  26.0 - 34.0 pg Final   MCHC 02/09/2022 31.1  30.0 - 36.0 g/dL Final   RDW 02/09/2022 16.6 (H)  11.5 - 15.5 % Final   Platelets 02/09/2022 403 (H)  150 - 400 K/uL Final   nRBC 02/09/2022  0.0  0.0 - 0.2 % Final   Performed at [facility], [place] [address] [place], [place] [address]   Glucose-Capillary 02/08/2022 117 (H)  70 - 99 mg/dL Final   Glucose reference range applies only to samples taken after fasting for at least 8 hours.   Sodium 02/09/2022 138  135 - 145 mmol/L Final   Potassium 02/09/2022 3.7  3.5 - 5.1 mmol/L Final   Chloride 02/09/2022 103  98 - 111 mmol/L Final   CO2 02/09/2022 24  22 - 32 mmol/L Final   Glucose, Bld 02/09/2022 134 (H)  70 - 99 mg/dL Final   Glucose reference range applies only to samples taken after  fasting for at least 8 hours.   BUN 02/09/2022 9  6 - 20 mg/dL Final   Creatinine, Ser 02/09/2022 0.44  0.44 - 1.00 mg/dL Final   Calcium 02/09/2022 8.3 (L)  8.9 - 10.3 mg/dL Final   GFR, Estimated 02/09/2022 >60  >60 mL/min Final   Comment: (NOTE) Calculated using the CKD-EPI Creatinine Equation (2021)    Anion gap 02/09/2022 11  5 - 15 Final   Performed at [facility], [place] [address], [place], [place] [address]   Iron 02/09/2022 20 (L)  28 - 170 ug/dL Final   TIBC 02/09/2022 455 (H)  250 - 450 ug/dL Final   Saturation Ratios 02/09/2022 4 (L)  10.4 - 31.8 % Final   UIBC 02/09/2022 435  ug/dL Final   Performed at [facility], [place] [address] [place], [place] [address]   Ferritin 02/09/2022 2 (L)  11 - 307 ng/mL Final   Performed at [place] [address], [place], [place] [address]   Weight 02/09/2022 2,846.58  oz Final   Height 02/09/2022 62  in Final   BP 02/09/2022 143/80  mmHg Final   WBC 02/09/2022 8.1  4.0 - 10.5 K/uL Final   RBC 02/09/2022 4.38  3.87 - 5.11 MIL/uL Final   Hemoglobin 02/09/2022 10.9 (L)  12.0 - 15.0 g/dL Final   HCT 02/09/2022 33.3 (L)  36.0 - 46.0 % Final   MCV 02/09/2022 76.0 (L)  80.0 - 100.0 fL Final   MCH 02/09/2022 24.9 (L)  26.0 - 34.0 pg Final   MCHC 02/09/2022 32.7  30.0 - 36.0 g/dL Final   RDW 02/09/2022 16.5 (H)  11.5 - 15.5 % Final   Platelets 02/09/2022 397  150 - 400 K/uL Final   nRBC 02/09/2022 0.0  0.0 - 0.2 % Final   Neutrophils Relative % 02/09/2022 66  % Final   Neutro Abs 02/09/2022 5.4  1.7 - 7.7 K/uL Final   Lymphocytes Relative 02/09/2022 24  % Final   Lymphs Abs 02/09/2022 1.9  0.7 - 4.0 K/uL Final   Monocytes Relative 02/09/2022 8  % Final   Monocytes Absolute 02/09/2022 0.6  0.1 - 1.0 K/uL Final   Eosinophils Relative 02/09/2022 1  % Final   Eosinophils Absolute 02/09/2022 0.1  0.0 - 0.5 K/uL Final   Basophils Relative 02/09/2022 1  % Final   Basophils Absolute 02/09/2022 0.1  0.0 - 0.1 K/uL Final    Immature Granulocytes 02/09/2022 0  % Final   Abs Immature Granulocytes 02/09/2022 0.03  0.00 - 0.07 K/uL Final   Performed at [facility], [place] [address], [place], [place] [address]   Glucose-Capillary 02/09/2022 238 (H)  70 - 99 mg/dL Final   Glucose reference range applies only to samples  taken after fasting for at least 8 hours.   Glucose-Capillary 02/09/2022 91  70 - 99 mg/dL Final   Glucose reference range applies only to samples taken after fasting for at least 8 hours.   CRP 02/09/2022 <0.5  <1.0 mg/dL Final   Performed at [place] [address], [place], [place] [address]   Sed Rate 02/09/2022 15  0 - 22 mm/hr Final   Performed at [facility] [address] [place], [place] [address]   Glucose-Capillary 02/09/2022 137 (H)  70 - 99 mg/dL Final   Glucose reference range applies only to samples taken after fasting for at least 8 hours.  Admission on 01/24/2022, Discharged on 02/08/2022  Component Date Value Ref Range Status   Glucose-Capillary 01/24/2022 143 (H)  70 - 99 mg/dL Final   Glucose reference range applies only to samples taken after fasting for at least 8 hours.   Glucose-Capillary 01/24/2022 120 (H)  70 - 99 mg/dL Final   Glucose reference range applies only to samples taken after fasting for at least 8 hours.   Glucose-Capillary 01/25/2022 122 (H)  70 - 99 mg/dL Final   Glucose reference range applies only to samples taken after fasting for at least 8 hours.   Glucose-Capillary 01/25/2022 190 (H)  70 - 99 mg/dL Final   Glucose reference range applies only to samples taken after fasting for at least 8 hours.   Glucose-Capillary 01/25/2022 163 (H)  70 - 99 mg/dL Final   Glucose reference range applies only to samples taken after fasting for at least 8 hours.   WBC 01/26/2022 6.5  4.0 - 10.5 K/uL Final   RBC 01/26/2022 4.53  3.87 - 5.11 MIL/uL Final   Hemoglobin 01/26/2022 11.3 (L)  12.0 - 15.0 g/dL Final   HCT 01/26/2022 36.4  36.0 - 46.0 %  Final   MCV 01/26/2022 80.4  80.0 - 100.0 fL Final   MCH 01/26/2022 24.9 (L)  26.0 - 34.0 pg Final   MCHC 01/26/2022 31.0  30.0 - 36.0 g/dL Final   RDW 01/26/2022 17.3 (H)  11.5 - 15.5 % Final   Platelets 01/26/2022 327  150 - 400 K/uL Final   nRBC 01/26/2022 0.0  0.0 - 0.2 % Final   Neutrophils Relative % 01/26/2022 60  % Final   Neutro Abs 01/26/2022 3.9  1.7 - 7.7 K/uL Final   Lymphocytes Relative 01/26/2022 31  % Final   Lymphs Abs 01/26/2022 2.0  0.7 - 4.0 K/uL Final   Monocytes Relative 01/26/2022 7  % Final   Monocytes Absolute 01/26/2022 0.5  0.1 - 1.0 K/uL Final   Eosinophils Relative 01/26/2022 1  % Final   Eosinophils Absolute 01/26/2022 0.1  0.0 - 0.5 K/uL Final   Basophils Relative 01/26/2022 1  % Final   Basophils Absolute 01/26/2022 0.1  0.0 - 0.1 K/uL Final   Immature Granulocytes 01/26/2022 0  % Final   Abs Immature Granulocytes 01/26/2022 0.02  0.00 - 0.07 K/uL Final   Performed at [facility], [place] [address], [place], [place] [address]   Glucose-Capillary 01/26/2022 149 (H)  70 - 99 mg/dL Final   Glucose reference range applies only to samples taken after fasting for at least 8 hours.   Glucose-Capillary 01/26/2022 130 (H)  70 - 99 mg/dL Final   Glucose reference range applies only to samples taken after fasting for at least 8 hours.   Glucose-Capillary 01/26/2022 151 (H)  70 - 99  mg/dL Final   Glucose reference range applies only to samples taken after fasting for at least 8 hours.   Glucose-Capillary 01/26/2022 170 (H)  70 - 99 mg/dL Final   Glucose reference range applies only to samples taken after fasting for at least 8 hours.   Glucose-Capillary 01/27/2022 129 (H)  70 - 99 mg/dL Final   Glucose reference range applies only to samples taken after fasting for at least 8 hours.   Glucose-Capillary 01/27/2022 101 (H)  70 - 99 mg/dL Final   Glucose reference range applies only to samples taken after fasting for at least 8 hours.   Glucose-Capillary  01/27/2022 220 (H)  70 - 99 mg/dL Final   Glucose reference range applies only to samples taken after fasting for at least 8 hours.   Glucose-Capillary 01/27/2022 154 (H)  70 - 99 mg/dL Final   Glucose reference range applies only to samples taken after fasting for at least 8 hours.   Glucose-Capillary 01/27/2022 163 (H)  70 - 99 mg/dL Final   Glucose reference range applies only to samples taken after fasting for at least 8 hours.   Glucose-Capillary 01/28/2022 127 (H)  70 - 99 mg/dL Final   Glucose reference range applies only to samples taken after fasting for at least 8 hours.   Glucose-Capillary 01/28/2022 110 (H)  70 - 99 mg/dL Final   Glucose reference range applies only to samples taken after fasting for at least 8 hours.   Glucose-Capillary 01/28/2022 167 (H)  70 - 99 mg/dL Final   Glucose reference range applies only to samples taken after fasting for at least 8 hours.   Glucose-Capillary 01/29/2022 145 (H)  70 - 99 mg/dL Final   Glucose reference range applies only to samples taken after fasting for at least 8 hours.   Glucose-Capillary 01/29/2022 203 (H)  70 - 99 mg/dL Final   Glucose reference range applies only to samples taken after fasting for at least 8 hours.   Glucose-Capillary 01/29/2022 153 (H)  70 - 99 mg/dL Final   Glucose reference range applies only to samples taken after fasting for at least 8 hours.   Glucose-Capillary 01/29/2022 166 (H)  70 - 99 mg/dL Final   Glucose reference range applies only to samples taken after fasting for at least 8 hours.   Glucose-Capillary 01/30/2022 142 (H)  70 - 99 mg/dL Final   Glucose reference range applies only to samples taken after fasting for at least 8 hours.   Glucose-Capillary 01/30/2022 138 (H)  70 - 99 mg/dL Final   Glucose reference range applies only to samples taken after fasting for at least 8 hours.   Glucose-Capillary 01/30/2022 185 (H)  70 - 99 mg/dL Final   Glucose reference range applies only to samples taken  after fasting for at least 8 hours.   Glucose-Capillary 01/30/2022 220 (H)  70 - 99 mg/dL Final   Glucose reference range applies only to samples taken after fasting for at least 8 hours.   Glucose-Capillary 01/31/2022 147 (H)  70 - 99 mg/dL Final   Glucose reference range applies only to samples taken after fasting for at least 8 hours.   Glucose-Capillary 01/31/2022 131 (H)  70 - 99 mg/dL Final   Glucose reference range applies only to samples taken after fasting for at least 8 hours.   Glucose-Capillary 01/31/2022 169 (H)  70 - 99 mg/dL Final   Glucose reference range applies only to samples taken after fasting for at least 8  hours.   Glucose-Capillary 01/31/2022 144 (H)  70 - 99 mg/dL Final   Glucose reference range applies only to samples taken after fasting for at least 8 hours.   Comment 1 01/31/2022 RN AWARE   Final   Glucose-Capillary 02/01/2022 117 (H)  70 - 99 mg/dL Final   Glucose reference range applies only to samples taken after fasting for at least 8 hours.   Glucose-Capillary 02/01/2022 180 (H)  70 - 99 mg/dL Final   Glucose reference range applies only to samples taken after fasting for at least 8 hours.   Glucose-Capillary 02/01/2022 209 (H)  70 - 99 mg/dL Final   Glucose reference range applies only to samples taken after fasting for at least 8 hours.   Glucose-Capillary 02/02/2022 131 (H)  70 - 99 mg/dL Final   Glucose reference range applies only to samples taken after fasting for at least 8 hours.   WBC 02/02/2022 7.5  4.0 - 10.5 K/uL Final   RBC 02/02/2022 4.52  3.87 - 5.11 MIL/uL Final   Hemoglobin 02/02/2022 11.3 (L)  12.0 - 15.0 g/dL Final   HCT 02/02/2022 35.0 (L)  36.0 - 46.0 % Final   MCV 02/02/2022 77.4 (L)  80.0 - 100.0 fL Final   MCH 02/02/2022 25.0 (L)  26.0 - 34.0 pg Final   MCHC 02/02/2022 32.3  30.0 - 36.0 g/dL Final   RDW 02/02/2022 16.7 (H)  11.5 - 15.5 % Final   Platelets 02/02/2022 345  150 - 400 K/uL Final   nRBC 02/02/2022 0.0  0.0 - 0.2 % Final    Neutrophils Relative % 02/02/2022 63  % Final   Neutro Abs 02/02/2022 4.7  1.7 - 7.7 K/uL Final   Lymphocytes Relative 02/02/2022 28  % Final   Lymphs Abs 02/02/2022 2.1  0.7 - 4.0 K/uL Final   Monocytes Relative 02/02/2022 7  % Final   Monocytes Absolute 02/02/2022 0.5  0.1 - 1.0 K/uL Final   Eosinophils Relative 02/02/2022 1  % Final   Eosinophils Absolute 02/02/2022 0.1  0.0 - 0.5 K/uL Final   Basophils Relative 02/02/2022 1  % Final   Basophils Absolute 02/02/2022 0.1  0.0 - 0.1 K/uL Final   Immature Granulocytes 02/02/2022 0  % Final   Abs Immature Granulocytes 02/02/2022 0.03  0.00 - 0.07 K/uL Final   Performed at [facility], [place] [address], [place], [place] [address]   Glucose-Capillary 02/02/2022 95  70 - 99 mg/dL Final   Glucose reference range applies only to samples taken after fasting for at least 8 hours.   Glucose-Capillary 02/02/2022 120 (H)  70 - 99 mg/dL Final   Glucose reference range applies only to samples taken after fasting for at least 8 hours.   Glucose-Capillary 02/02/2022 191 (H)  70 - 99 mg/dL Final   Glucose reference range applies only to samples taken after fasting for at least 8 hours.   Glucose-Capillary 02/03/2022 148 (H)  70 - 99 mg/dL Final   Glucose reference range applies only to samples taken after fasting for at least 8 hours.   Glucose-Capillary 02/03/2022 122 (H)  70 - 99 mg/dL Final   Glucose reference range applies only to samples taken after fasting for at least 8 hours.   Glucose-Capillary 02/03/2022 233 (H)  70 - 99 mg/dL Final   Glucose reference range applies only to samples taken after fasting for at least 8 hours.   Glucose-Capillary 02/04/2022 126 (H)  70 - 99 mg/dL Final  Glucose reference range applies only to samples taken after fasting for at least 8 hours.   Glucose-Capillary 02/04/2022 117 (H)  70 - 99 mg/dL Final   Glucose reference range applies only to samples taken after fasting for at least 8 hours.    Glucose-Capillary 02/04/2022 135 (H)  70 - 99 mg/dL Final   Glucose reference range applies only to samples taken after fasting for at least 8 hours.   Glucose-Capillary 02/04/2022 197 (H)  70 - 99 mg/dL Final   Glucose reference range applies only to samples taken after fasting for at least 8 hours.   Glucose-Capillary 02/05/2022 170 (H)  70 - 99 mg/dL Final   Glucose reference range applies only to samples taken after fasting for at least 8 hours.   Glucose-Capillary 02/05/2022 107 (H)  70 - 99 mg/dL Final   Glucose reference range applies only to samples taken after fasting for at least 8 hours.   Glucose-Capillary 02/05/2022 184 (H)  70 - 99 mg/dL Final   Glucose reference range applies only to samples taken after fasting for at least 8 hours.   Glucose-Capillary 02/05/2022 256 (H)  70 - 99 mg/dL Final   Glucose reference range applies only to samples taken after fasting for at least 8 hours.   Glucose-Capillary 02/06/2022 144 (H)  70 - 99 mg/dL Final   Glucose reference range applies only to samples taken after fasting for at least 8 hours.   Glucose-Capillary 02/06/2022 190 (H)  70 - 99 mg/dL Final   Glucose reference range applies only to samples taken after fasting for at least 8 hours.   Glucose-Capillary 02/06/2022 92  70 - 99 mg/dL Final   Glucose reference range applies only to samples taken after fasting for at least 8 hours.   Trichomonas 02/06/2022 Negative   Final   Bacterial Vaginitis-Urine 02/06/2022 Negative   Final   Molecular Comment 02/06/2022 For tests bacteria and/or candida, this specimen does not meet the   Final   Molecular Comment 02/06/2022 strict criteria set by the FDA. The result interpretation should be   Final   Molecular Comment 02/06/2022 considered in conjunction with the patient's clinical history.   Final   Comment 02/06/2022 Normal Reference Range Trichomonas - Negative   Final   Glucose-Capillary 02/06/2022 197 (H)  70 - 99 mg/dL Final   Glucose  reference range applies only to samples taken after fasting for at least 8 hours.   Glucose-Capillary 02/07/2022 129 (H)  70 - 99 mg/dL Final   Glucose reference range applies only to samples taken after fasting for at least 8 hours.   Glucose-Capillary 02/07/2022 207 (H)  70 - 99 mg/dL Final   Glucose reference range applies only to samples taken after fasting for at least 8 hours.   Glucose-Capillary 02/07/2022 153 (H)  70 - 99 mg/dL Final   Glucose reference range applies only to samples taken after fasting for at least 8 hours.   Glucose-Capillary 02/08/2022 129 (H)  70 - 99 mg/dL Final   Glucose reference range applies only to samples taken after fasting for at least 8 hours.   Glucose-Capillary 02/08/2022 107 (H)  70 - 99 mg/dL Final   Glucose reference range applies only to samples taken after fasting for at least 8 hours.  Admission on 01/23/2022, Discharged on 01/24/2022  Component Date Value Ref Range Status   Sodium 01/23/2022 140  135 - 145 mmol/L Final   Potassium 01/23/2022 4.4  3.5 - 5.1 mmol/L Final  Chloride 01/23/2022 101  98 - 111 mmol/L Final   CO2 01/23/2022 25  22 - 32 mmol/L Final   Glucose, Bld 01/23/2022 198 (H)  70 - 99 mg/dL Final   Glucose reference range applies only to samples taken after fasting for at least 8 hours.   BUN 01/23/2022 13  6 - 20 mg/dL Final   Creatinine, Ser 01/23/2022 0.62  0.44 - 1.00 mg/dL Final   Calcium 01/23/2022 9.3  8.9 - 10.3 mg/dL Final   GFR, Estimated 01/23/2022 >60  >60 mL/min Final   Comment: (NOTE) Calculated using the CKD-EPI Creatinine Equation (2021)    Anion gap 01/23/2022 14  5 - 15 Final   Performed at [facility], [place] [address], [place], [place] [address]   WBC 01/23/2022 5.8  4.0 - 10.5 K/uL Final   RBC 01/23/2022 4.63  3.87 - 5.11 MIL/uL Final   Hemoglobin 01/23/2022 11.7 (L)  12.0 - 15.0 g/dL Final   HCT 01/23/2022 37.4  36.0 - 46.0 % Final   MCV 01/23/2022 80.8  80.0 - 100.0 fL Final   MCH 01/23/2022  25.3 (L)  26.0 - 34.0 pg Final   MCHC 01/23/2022 31.3  30.0 - 36.0 g/dL Final   RDW 01/23/2022 17.9 (H)  11.5 - 15.5 % Final   Platelets 01/23/2022 333  150 - 400 K/uL Final   nRBC 01/23/2022 0.0  0.0 - 0.2 % Final   Performed at [facility] [address] [place], [place] [address]   Troponin I (High Sensitivity) 01/23/2022 6  <18 ng/L Final   Comment: (NOTE) Elevated high sensitivity troponin I (hsTnI) values and significant  changes across serial measurements may suggest ACS but many other  chronic and acute conditions are known to elevate hsTnI results.  Refer to the "Links" section for chest pain algorithms and additional  guidance. Performed at [facility], [place] [address], [place], [place] [address]    Troponin I (High Sensitivity) 01/23/2022 5  <18 ng/L Final   Comment: (NOTE) Elevated high sensitivity troponin I (hsTnI) values and significant  changes across serial measurements may suggest ACS but many other  chronic and acute conditions are known to elevate hsTnI results.  Refer to the "Links" section for chest pain algorithms and additional  guidance. Performed at [facility], [place] [address], [place], [place] [address]    SARS Coronavirus 2 by RT PCR 01/23/2022 NEGATIVE  NEGATIVE Final   Comment: (NOTE) SARS-CoV-2 target nucleic acids are NOT DETECTED.  The SARS-CoV-2 RNA is generally detectable in upper and lower respiratory specimens during the acute phase of infection. The lowest concentration of SARS-CoV-2 viral copies this assay can detect is 250 copies / mL. A negative result does not preclude SARS-CoV-2 infection and should not be used as the sole basis for treatment or other patient management decisions.  A negative result may occur with improper specimen collection / handling, submission of specimen other than nasopharyngeal swab, presence of viral mutation(s) within the areas targeted by this assay, and inadequate number of viral  copies (<250 copies / mL). A negative result must be combined with clinical observations, patient history, and epidemiological information.  Fact Sheet for Patients:   https://www.patel.info/  Fact Sheet for Healthcare Providers: https://hall.com/  This test is not yet approved or                           cleared by  the [employer] and has been authorized for detection and/or diagnosis of SARS-CoV-2 by FDA under an Emergency Use Authorization (EUA).  This EUA will remain in effect (meaning this test can be used) for the duration of the COVID-19 declaration under Section 564(b)(1) of the Act, 21 U.S.C. section 360bbb-3(b)(1), unless the authorization is terminated or revoked sooner.  Performed at [facility], [place] [address] [place], [place] [address]    B Natriuretic Peptide 01/23/2022 41.3  0.0 - 100.0 pg/mL Final   Performed at [facility] [address] [place], [place] [address]   Group A Strep by PCR 01/23/2022 NOT DETECTED  NOT DETECTED Final   Performed at [facility], [place] [address] [place], [place] [address]   Color, Urine 01/23/2022 YELLOW  YELLOW Final   APPearance 01/23/2022 CLEAR  CLEAR Final   Specific Gravity, Urine 01/23/2022 1.018  1.005 - 1.030 Final   pH 01/23/2022 7.0  5.0 - 8.0 Final   Glucose, UA 01/23/2022 NEGATIVE  NEGATIVE mg/dL Final   Hgb urine dipstick 01/23/2022 NEGATIVE  NEGATIVE Final   Bilirubin Urine 01/23/2022 NEGATIVE  NEGATIVE Final   Ketones, ur 01/23/2022 NEGATIVE  NEGATIVE mg/dL Final   Protein, ur 01/23/2022 NEGATIVE  NEGATIVE mg/dL Final   Nitrite 01/23/2022 NEGATIVE  NEGATIVE Final   Leukocytes,Ua 01/23/2022 TRACE (A)  NEGATIVE Final   RBC / HPF 01/23/2022 0-5  0 - 5 RBC/hpf Final   WBC, UA 01/23/2022 0-5  0 - 5 WBC/hpf Final   Bacteria, UA 01/23/2022 NONE SEEN  NONE SEEN Final   Squamous Epithelial / LPF 01/23/2022 0-5  0 - 5 Final   Mucus 01/23/2022 PRESENT    Final   Performed at [facility], [place] [address] [place], [place] [address]   SARS Coronavirus 2 by RT PCR 01/23/2022 NEGATIVE  NEGATIVE Final   Comment: (NOTE) SARS-CoV-2 target nucleic acids are NOT DETECTED.  The SARS-CoV-2 RNA is generally detectable in upper respiratory specimens during the acute phase of infection. The lowest concentration of SARS-CoV-2 viral copies this assay can detect is 138 copies/mL. A negative result does not preclude SARS-Cov-2 infection and should not be used as the sole basis for treatment or other patient management decisions. A negative result may occur with  improper specimen collection/handling, submission of specimen other than nasopharyngeal swab, presence of viral mutation(s) within the areas targeted by this assay, and inadequate number of viral copies(<138 copies/mL). A negative result must be combined with clinical observations, patient history, and epidemiological information. The expected result is Negative.  Fact Sheet for Patients:  EntrepreneurPulse.com.au  Fact Sheet for Healthcare Providers:  IncredibleEmployment.be  This test is no                          t yet approved or cleared by the [place] FDA and  has been authorized for detection and/or diagnosis of SARS-CoV-2 by FDA under an Emergency Use Authorization (EUA). This EUA will remain  in effect (meaning this test can be used) for the duration of the COVID-19 declaration under Section 564(b)(1) of the Act, 21 U.S.C.section 360bbb-3(b)(1), unless the authorization is terminated  or revoked sooner.       Influenza A by PCR 01/23/2022 NEGATIVE  NEGATIVE Final   Influenza B by PCR 01/23/2022 NEGATIVE  NEGATIVE Final   Comment: (NOTE) The Xpert Xpress SARS-CoV-2/FLU/RSV plus assay is intended as an aid in the diagnosis of  influenza from Nasopharyngeal swab specimens and should not be used as a sole basis for treatment. Nasal  washings and aspirates are unacceptable for Xpert Xpress SARS-CoV-2/FLU/RSV testing.  Fact Sheet for Patients: EntrepreneurPulse.com.au  Fact Sheet for Healthcare Providers: IncredibleEmployment.be  This test is not yet approved or cleared by the [place] FDA and has been authorized for detection and/or diagnosis of SARS-CoV-2 by FDA under an Emergency Use Authorization (EUA). This EUA will remain in effect (meaning this test can be used) for the duration of the COVID-19 declaration under Section 564(b)(1) of the Act, 21 U.S.C. section 360bbb-3(b)(1), unless the authorization is terminated or revoked.  Performed at [facility], [place] [address] [place], [place] [address]    WBC 01/24/2022 6.1  4.0 - 10.5 K/uL Final   RBC 01/24/2022 4.60  3.87 - 5.11 MIL/uL Final   Hemoglobin 01/24/2022 11.7 (L)  12.0 - 15.0 g/dL Final   HCT 01/24/2022 37.0  36.0 - 46.0 % Final   MCV 01/24/2022 80.4  80.0 - 100.0 fL Final   MCH 01/24/2022 25.4 (L)  26.0 - 34.0 pg Final   MCHC 01/24/2022 31.6  30.0 - 36.0 g/dL Final   RDW 01/24/2022 17.6 (H)  11.5 - 15.5 % Final   Platelets 01/24/2022 334  150 - 400 K/uL Final   nRBC 01/24/2022 0.0  0.0 - 0.2 % Final   Neutrophils Relative % 01/24/2022 53  % Final   Neutro Abs 01/24/2022 3.3  1.7 - 7.7 K/uL Final   Lymphocytes Relative 01/24/2022 33  % Final   Lymphs Abs 01/24/2022 2.0  0.7 - 4.0 K/uL Final   Monocytes Relative 01/24/2022 11  % Final   Monocytes Absolute 01/24/2022 0.7  0.1 - 1.0 K/uL Final   Eosinophils Relative 01/24/2022 2  % Final   Eosinophils Absolute 01/24/2022 0.1  0.0 - 0.5 K/uL Final   Basophils Relative 01/24/2022 1  % Final   Basophils Absolute 01/24/2022 0.1  0.0 - 0.1 K/uL Final   Immature Granulocytes 01/24/2022 0  % Final   Abs Immature Granulocytes 01/24/2022 0.02  0.00 - 0.07 K/uL Final   Performed at [facility], [place] [address] [place], [place] [address]    Glucose-Capillary 01/24/2022 110 (H)  70 - 99 mg/dL Final   Glucose reference range applies only to samples taken after fasting for at least 8 hours.  No results displayed because visit has over 200 results.    Admission on 11/22/2021, Discharged on 11/22/2021  Component Date Value Ref Range Status   Glucose-Capillary 11/22/2021 169 (H)  70 - 99 mg/dL Final   Glucose reference range applies only to samples taken after fasting for at least 8 hours.  No results displayed because visit has over 200 results.    Admission on 10/04/2021, Discharged on 10/22/2021  Component Date Value Ref Range Status   SARS Coronavirus 2 by RT PCR 10/04/2021 NEGATIVE  NEGATIVE Final   Comment: (NOTE) SARS-CoV-2 target nucleic acids are NOT DETECTED.  The SARS-CoV-2 RNA is generally detectable in upper respiratory specimens during the acute phase of infection. The lowest concentration of SARS-CoV-2 viral copies this assay can detect is 138 copies/mL. A negative result does not preclude SARS-Cov-2 infection and should not be used as the sole basis for treatment or other patient management decisions. A negative result may occur with  improper specimen collection/handling, submission of specimen other than nasopharyngeal swab, presence of viral mutation(s) within the areas targeted by this assay, and inadequate  number of viral copies(<138 copies/mL). A negative result must be combined with clinical observations, patient history, and epidemiological information. The expected result is Negative.  Fact Sheet for Patients:  EntrepreneurPulse.com.au  Fact Sheet for Healthcare Providers:  IncredibleEmployment.be  This test is no                          t yet approved or cleared by the [place] FDA and  has been authorized for detection and/or diagnosis of SARS-CoV-2 by FDA under an Emergency Use Authorization (EUA). This EUA will remain  in effect (meaning this test can  be used) for the duration of the COVID-19 declaration under Section 564(b)(1) of the Act, 21 U.S.C.section 360bbb-3(b)(1), unless the authorization is terminated  or revoked sooner.       Influenza A by PCR 10/04/2021 NEGATIVE  NEGATIVE Final   Influenza B by PCR 10/04/2021 NEGATIVE  NEGATIVE Final   Comment: (NOTE) The Xpert Xpress SARS-CoV-2/FLU/RSV plus assay is intended as an aid in the diagnosis of influenza from Nasopharyngeal swab specimens and should not be used as a sole basis for treatment. Nasal washings and aspirates are unacceptable for Xpert Xpress SARS-CoV-2/FLU/RSV testing.  Fact Sheet for Patients: EntrepreneurPulse.com.au  Fact Sheet for Healthcare Providers: IncredibleEmployment.be  This test is not yet approved or cleared by the [place] FDA and has been authorized for detection and/or diagnosis of SARS-CoV-2 by FDA under an Emergency Use Authorization (EUA). This EUA will remain in effect (meaning this test can be used) for the duration of the COVID-19 declaration under Section 564(b)(1) of the Act, 21 U.S.C. section 360bbb-3(b)(1), unless the authorization is terminated or revoked.  Performed at [facility], [place] [address], [place], [place] [address]    WBC 10/04/2021 8.4  4.0 - 10.5 K/uL Final   RBC 10/04/2021 4.42  3.87 - 5.11 MIL/uL Final   Hemoglobin 10/04/2021 11.6 (L)  12.0 - 15.0 g/dL Final   HCT 10/04/2021 35.7 (L)  36.0 - 46.0 % Final   MCV 10/04/2021 80.8  80.0 - 100.0 fL Final   MCH 10/04/2021 26.2  26.0 - 34.0 pg Final   MCHC 10/04/2021 32.5  30.0 - 36.0 g/dL Final   RDW 10/04/2021 16.0 (H)  11.5 - 15.5 % Final   Platelets 10/04/2021 372  150 - 400 K/uL Final   nRBC 10/04/2021 0.0  0.0 - 0.2 % Final   Neutrophils Relative % 10/04/2021 68  % Final   Neutro Abs 10/04/2021 5.7  1.7 - 7.7 K/uL Final   Lymphocytes Relative 10/04/2021 22  % Final   Lymphs Abs 10/04/2021 1.8  0.7 - 4.0 K/uL Final    Monocytes Relative 10/04/2021 8  % Final   Monocytes Absolute 10/04/2021 0.7  0.1 - 1.0 K/uL Final   Eosinophils Relative 10/04/2021 1  % Final   Eosinophils Absolute 10/04/2021 0.1  0.0 - 0.5 K/uL Final   Basophils Relative 10/04/2021 1  % Final   Basophils Absolute 10/04/2021 0.1  0.0 - 0.1 K/uL Final   Immature Granulocytes 10/04/2021 0  % Final   Abs Immature Granulocytes 10/04/2021 0.03  0.00 - 0.07 K/uL Final   Performed at [facility], [place] [address], [place], [place] [address]   Sodium 10/04/2021 136  135 - 145 mmol/L Final   Potassium 10/04/2021 4.2  3.5 - 5.1 mmol/L Final   Chloride 10/04/2021 104  98 - 111 mmol/L Final   CO2 10/04/2021 25  22 - 32 mmol/L Final   Glucose, Bld 10/04/2021 100 (H)  70 - 99 mg/dL Final   Glucose reference range applies only to samples taken after fasting for at least 8 hours.   BUN 10/04/2021 9  6 - 20 mg/dL Final   Creatinine, Ser 10/04/2021 0.52  0.44 - 1.00 mg/dL Final   Calcium 10/04/2021 9.0  8.9 - 10.3 mg/dL Final   Total Protein 10/04/2021 7.0  6.5 - 8.1 g/dL Final   Albumin 10/04/2021 3.2 (L)  3.5 - 5.0 g/dL Final   AST 10/04/2021 13 (L)  15 - 41 U/L Final   ALT 10/04/2021 10  0 - 44 U/L Final   Alkaline Phosphatase 10/04/2021 61  38 - 126 U/L Final   Total Bilirubin 10/04/2021 0.3  0.3 - 1.2 mg/dL Final   GFR, Estimated 10/04/2021 >60  >60 mL/min Final   Comment: (NOTE) Calculated using the CKD-EPI Creatinine Equation (2021)    Anion gap 10/04/2021 7  5 - 15 Final   Performed at [facility] [address] [place], [place] [address]   Hgb A1c MFr Bld 10/04/2021 7.0 (H)  4.8 - 5.6 % Final   Comment: (NOTE) Pre diabetes:          5.7%-6.4%  Diabetes:              >6.4%  Glycemic control for   <7.0% adults with diabetes    Mean Plasma Glucose 10/04/2021 154.2  mg/dL Final   Performed at [facility], [place] [address], [place], [place] [address]   Cholesterol 10/04/2021 178  0 - 200 mg/dL Final    Triglycerides 10/04/2021 155 (H)  <150 mg/dL Final   HDL 10/04/2021 52  >40 mg/dL Final   Total CHOL/HDL Ratio 10/04/2021 3.4  RATIO Final   VLDL 10/04/2021 31  0 - 40 mg/dL Final   LDL Cholesterol 10/04/2021 95  0 - 99 mg/dL Final   Comment:        Total Cholesterol/HDL:CHD Risk Coronary Heart Disease Risk Table                     Men   Women  1/2 Average Risk   3.4   3.3  Average Risk       5.0   4.4  2 X Average Risk   9.6   7.1  3 X Average Risk  23.4   11.0        Use the calculated Patient Ratio above and the CHD Risk Table to determine the patient's CHD Risk.        ATP III CLASSIFICATION (LDL):  <100     mg/dL   Optimal  100-129  mg/dL   Near or Above                    Optimal  130-159  mg/dL   Borderline  160-189  mg/dL   High  >190     mg/dL   Very High Performed at [facility] [address], [place], [place] [address]    POC Amphetamine UR 10/04/2021 None Detected  NONE DETECTED (Cut Off Level 1000 ng/mL) Final   POC Secobarbital (BAR) 10/04/2021 None Detected  NONE DETECTED (Cut Off Level 300 ng/mL) Final   POC Buprenorphine (BUP) 10/04/2021 None Detected  NONE DETECTED (Cut Off Level 10 ng/mL) Final   POC Oxazepam (BZO) 10/04/2021 None Detected  NONE DETECTED (Cut Off Level 300  ng/mL) Final   POC Cocaine UR 10/04/2021 None Detected  NONE DETECTED (Cut Off Level 300 ng/mL) Final   POC Methamphetamine UR 10/04/2021 None Detected  NONE DETECTED (Cut Off Level 1000 ng/mL) Final   POC Morphine 10/04/2021 None Detected  NONE DETECTED (Cut Off Level 300 ng/mL) Final   POC Methadone UR 10/04/2021 None Detected  NONE DETECTED (Cut Off Level 300 ng/mL) Final   POC Oxycodone UR 10/04/2021 Positive (A)  NONE DETECTED (Cut Off Level 100 ng/mL) Final   POC Marijuana UR 10/04/2021 None Detected  NONE DETECTED (Cut Off Level 50 ng/mL) Final   SARSCOV2ONAVIRUS 2 AG 10/04/2021 NEGATIVE  NEGATIVE Final   Comment: (NOTE) SARS-CoV-2 antigen NOT DETECTED.   Negative results  are presumptive.  Negative results do not preclude SARS-CoV-2 infection and should not be used as the sole basis for treatment or other patient management decisions, including infection  control decisions, particularly in the presence of clinical signs and  symptoms consistent with COVID-19, or in those who have been in contact with the virus.  Negative results must be combined with clinical observations, patient history, and epidemiological information. The expected result is Negative.  Fact Sheet for Patients: HandmadeRecipes.com.cy  Fact Sheet for Healthcare Providers: FuneralLife.at  This test is not yet approved or cleared by the [place] FDA and  has been authorized for detection and/or diagnosis of SARS-CoV-2 by FDA under an Emergency Use Authorization (EUA).  This EUA will remain in effect (meaning this test can be used) for the duration of  the COV                          ID-19 declaration under Section 564(b)(1) of the Act, 21 U.S.C. section 360bbb-3(b)(1), unless the authorization is terminated or revoked sooner.     Valproic Acid Lvl 10/04/2021 51  50.0 - 100.0 ug/mL Final   Performed at [facility] [address], [place], [place] [address]   Valproic Acid Lvl 10/08/2021 57  50.0 - 100.0 ug/mL Final   Performed at [facility] [address] [place], [place] [address]   Glucose-Capillary 10/09/2021 123 (H)  70 - 99 mg/dL Final   Glucose reference range applies only to samples taken after fasting for at least 8 hours.  There may be more visits with results that are not included.    Blood Alcohol level:  Lab Results  Component Value Date   ETH <10 11/23/2021   ETH <10 77/93/9030    Metabolic Disorder Labs: Lab Results  Component Value Date   HGBA1C 6.7 (H) 12/28/2021   MPG 145.59 12/28/2021   MPG 154.2 10/04/2021   Lab Results  Component Value Date   PROLACTIN 3.8 (L) 11/23/2021   Lab  Results  Component Value Date   CHOL 171 11/23/2021   TRIG 125 11/23/2021   HDL 65 11/23/2021   CHOLHDL 2.6 11/23/2021   VLDL 25 11/23/2021   LDLCALC 81 11/23/2021   LDLCALC 95 10/04/2021    Therapeutic Lab Levels: No results found for: "LITHIUM" Lab Results  Component Value Date   VALPROATE 17 (L) 02/17/2022   VALPROATE 33 (L) 01/18/2022   No results found for: "CBMZ"  Physical Findings   PHQ2-9    Flowsheet Row ED from 11/23/2021 in [facility]  PHQ-2 Total Score 2  PHQ-9 Total Score 3      Flowsheet Row ED from 02/18/2022 in [facility]  Center ED from 02/17/2022 in [place] ED from 02/09/2022 in [place] No Risk No Risk No Risk        Musculoskeletal  Strength & Muscle Tone: within normal limits Gait & Station: normal Patient leans: N/A  Psychiatric Specialty Exam  Presentation  General Appearance:  Appropriate for Environment; Casual  Eye Contact: Fair  Speech: Clear and Coherent; Normal Rate  Speech Volume: Normal  Handedness: Right   Mood and Affect  Mood: -- (sad)  Affect: Blunt   Thought Process  Thought Processes: Coherent; Goal Directed; Linear  Descriptions of Associations:Intact  Orientation:Full (Time, Place and Person)  Thought Content:Logical  Diagnosis of Schizophrenia or Schizoaffective disorder in past: No  Duration of Psychotic Symptoms: No data recorded  Hallucinations:Hallucinations: None  Ideas of Reference:None  Suicidal Thoughts:Suicidal Thoughts: No  Homicidal Thoughts:Homicidal Thoughts: No   Sensorium  Memory: Immediate Fair  Judgment: Intact  Insight: Present   Executive Functions  Concentration: Fair  Attention Span: Fair  Recall: [place] of Knowledge: Fair  Language: Fair   Psychomotor Activity  Psychomotor  Activity: Psychomotor Activity: Normal   Assets  Assets: Communication Skills; Desire for Improvement; Financial Resources/Insurance; Leisure Time; Resilience   Sleep  Sleep: Sleep: Good   No data recorded  Physical Exam  Physical Exam Constitutional:      General: She is not in acute distress.    Appearance: She is not ill-appearing, toxic-appearing or diaphoretic.  Eyes:     General: No scleral icterus. Cardiovascular:     Rate and Rhythm: Normal rate.  Pulmonary:     Effort: Pulmonary effort is normal. No respiratory distress.  Neurological:     Mental Status: She is alert and oriented to person, place, and time.  Psychiatric:        Attention and Perception: Attention and perception normal.        Mood and Affect: Affect is blunt.        Speech: Speech normal.        Behavior: Behavior normal. Behavior is cooperative.        Thought Content: Thought content normal.    Review of Systems  Constitutional:  Negative for chills and fever.  Respiratory:  Negative for shortness of breath.   Cardiovascular:  Negative for chest pain and palpitations.  Gastrointestinal:  Negative for abdominal pain.  Neurological:  Negative for headaches.   Blood pressure 122/81, pulse 97, temperature 98.7 F (37.1 C), temperature source Oral, resp. rate 18, SpO2 97 %. There is no height or weight on file to calculate BMI.  Treatment Plan Summary: Plan Pt remains psychiatrically cleared. Disposition is pending placement.  Tharon Aquas, NP 02/22/2022 10:22 AM

## 2022-02-22 NOTE — ED Notes (Signed)
Pt awake. A/O. Resting quietly on recliner bed. No c/o resp issues. No noted distress. Will continue to monitor for safety

## 2022-02-23 DIAGNOSIS — F209 Schizophrenia, unspecified: Secondary | ICD-10-CM | POA: Diagnosis not present

## 2022-02-23 LAB — CBC WITH DIFFERENTIAL/PLATELET
Abs Immature Granulocytes: 0.01 10*3/uL (ref 0.00–0.07)
Basophils Absolute: 0.1 10*3/uL (ref 0.0–0.1)
Basophils Relative: 1 %
Eosinophils Absolute: 0.1 10*3/uL (ref 0.0–0.5)
Eosinophils Relative: 2 %
HCT: 30.6 % — ABNORMAL LOW (ref 36.0–46.0)
Hemoglobin: 9.7 g/dL — ABNORMAL LOW (ref 12.0–15.0)
Immature Granulocytes: 0 %
Lymphocytes Relative: 32 %
Lymphs Abs: 1.5 10*3/uL (ref 0.7–4.0)
MCH: 24.5 pg — ABNORMAL LOW (ref 26.0–34.0)
MCHC: 31.7 g/dL (ref 30.0–36.0)
MCV: 77.3 fL — ABNORMAL LOW (ref 80.0–100.0)
Monocytes Absolute: 0.4 10*3/uL (ref 0.1–1.0)
Monocytes Relative: 9 %
Neutro Abs: 2.7 10*3/uL (ref 1.7–7.7)
Neutrophils Relative %: 56 %
Platelets: 372 10*3/uL (ref 150–400)
RBC: 3.96 MIL/uL (ref 3.87–5.11)
RDW: 16.5 % — ABNORMAL HIGH (ref 11.5–15.5)
WBC: 4.8 10*3/uL (ref 4.0–10.5)
nRBC: 0 % (ref 0.0–0.2)

## 2022-02-23 LAB — GLUCOSE, CAPILLARY
Glucose-Capillary: 133 mg/dL — ABNORMAL HIGH (ref 70–99)
Glucose-Capillary: 202 mg/dL — ABNORMAL HIGH (ref 70–99)
Glucose-Capillary: 143 mg/dL — ABNORMAL HIGH (ref 70–99)
Glucose-Capillary: 143 mg/dL — ABNORMAL HIGH (ref 70–99)

## 2022-02-23 NOTE — ED Provider Notes (Signed)
Behavioral Health Progress Note  Date and Time: 02/23/2022 9:56 AM Name: Teresa Murillo MRN:  [id number]  Subjective:   Teresa Murillo is a 61 y.o. female with a past psychiatric history of schizophrenia, aggressive behavior, and possible intellectual disability presenting to [facility] on 11/23/21 voluntarily as a walk in via [facility] with complaints that she was locked out of the group home. Patient has been dismissed from group home due to multiple elopements. Pt had already been discharged from this facility but had been living there temporarily while DSS was looking for new placement. Pt is boarding at [facility].   AID to Capacity Evaluation (ACE) completed by Dr. Louis Meckel, Dr. Dwyane Dee on 12/11/2021.  Please see media tab for full details.    Teresa Harman, PhD completed: Wechsler Adult Intelligence Scale-4, Teresa Murillo achieved a full-scale IQ score of 73 and a percentile rank of 4 placing her in the borderline range of intellectual functioning (12/14/2021) Please see consult note from Teresa Harman, PhD on 12/14/2021.  On reassessment today, pt reports euthymic mood. She denies suicidal, homicidal or violent ideation. She denies auditory visual hallucinations or paranoia. There is no evidence of agitation, aggression, distractibility, or internal preoccupation. No delusions or paranoia elicited. Pt is calm, cooperative, pleasant. She has been interacting appropriately with peers on the unit. Pt remains psychiatrically cleared. Disposition is pending possible placement at [facility].  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 On the Wechsler Adult Intelligence Scale-4, Teresa Murillo achieved a  full-scale IQ score of 73 and a percentile rank of 4 placing her in the borderline range of intellectual functioning.    Chronic obstructive pulmonary disease (COPD) (HCC)    Glaucoma    Hyperlipidemia    Hypertension    Iron deficiency    Schizoaffective disorder (HCC)    Type 2 diabetes mellitus (HCC)     Past Surgical History:  Procedure Laterality Date   TUBAL LIGATION     Family History:  Family History  Problem Relation Age of Onset   Breast cancer Maternal Grandmother    Family Psychiatric  History: None reported Social History:  Social History   Substance and Sexual Activity  Alcohol Use Not Currently     Social History   Substance and Sexual Activity  Drug Use Not Currently    Social History   Socioeconomic History   Marital status: Divorced    Spouse name: Not on file   Number of children: Not on file   Years of education: Not on file   Highest education level: Not on file  Occupational History   Not on file  Tobacco Use   Smoking status: Former    Packs/day: 1.00    Types: Cigars, Cigarettes    Quit date: 11/09/2021    Years since quitting: 0.2   Smokeless tobacco: Current  Vaping Use   Vaping Use: Never used  Substance and Sexual Activity   Alcohol use: Not Currently   Drug use: Not Currently   Sexual activity: Not Currently    Birth control/protection: Surgical  Other Topics Concern   Not on file  Social History Narrative   Not on file   Social Determinants of Health   Financial Resource Strain: Not on file  Food Insecurity: Not on file  Transportation Needs: Not on file  Physical Activity: Not on file  Stress: Not on file  Social Connections: Not on file   SDOH:  SDOH Screenings   Depression (PHQ2-9): Low Risk  (11/23/2021)  Tobacco Use: High Risk (02/17/2022)   Additional Social History:                         Sleep: Good  Appetite:  Good  Current Medications:  Current Facility-Administered Medications   Medication Dose Route Frequency Provider Last Rate Last Admin   acetaminophen (TYLENOL) tablet 650 mg  650 mg Oral Q6H PRN [name], NP   650 mg at 02/21/22 2033   albuterol (VENTOLIN HFA) 108 (90 Base) MCG/ACT inhaler 2 puff  2 puff Inhalation Q6H PRN [name], NP   2 puff at 02/20/22 1944   alum & mag hydroxide-simeth (MAALOX/MYLANTA) 200-200-20 MG/5ML suspension 30 mL  30 mL Oral Q4H PRN [name], NP   30 mL at 02/21/22 2051   apixaban (ELIQUIS) tablet 5 mg  5 mg Oral BID [name], NP   5 mg at 02/23/22 0850   ARIPiprazole (ABILIFY) tablet 10 mg  10 mg Oral Daily [name], NP   10 mg at 02/23/22 0849   cloZAPine (CLOZARIL) tablet 75 mg  75 mg Oral BID France Ravens, MD   75 mg at 02/23/22 0849   colchicine tablet 0.6 mg  0.6 mg Oral QHS [name], NP   0.6 mg at 02/22/22 2117   diltiazem (CARDIZEM CD) 24 hr capsule 240 mg  240 mg Oral Daily [name], NP   240 mg at 02/23/22 0849   divalproex (DEPAKOTE ER) 24 hr tablet 500 mg  500 mg Oral BID [name], NP   500 mg at 02/23/22 0850   haloperidol (HALDOL) tablet 10 mg  10 mg Oral TID PRN [name], NP       insulin aspart (novoLOG) injection 0-9 Units  0-9 Units Subcutaneous TID WC France Ravens, MD   1 Units at 02/23/22 0848   insulin glargine-yfgn (SEMGLEE) injection 12 Units  12 Units Subcutaneous Daily [name], NP   12 Units at 02/23/22 0935   latanoprost (XALATAN) 0.005 % ophthalmic solution 1 drop  1 drop Both Eyes QHS [name], NP   1 drop at 02/22/22 2133   magnesium hydroxide (MILK OF MAGNESIA) suspension 30 mL  30 mL Oral Daily PRN [name], NP       menthol-cetylpyridinium (CEPACOL) lozenge 3 mg  1 lozenge Oral Q4H PRN Revonda Humphrey, NP   3 mg at 02/22/22 1352   metFORMIN (GLUCOPHAGE) tablet 500 mg  500 mg Oral BID WC [name], NP   500 mg at 02/23/22 0849   mometasone-formoterol (DULERA) 200-5 MCG/ACT inhaler 2 puff  2 puff Inhalation BID [name], NP   2 puff at  02/23/22 0849   nicotine (NICODERM CQ - dosed in mg/24 hr) patch 7 mg  7 mg Transdermal Daily [name], NP   7 mg at 02/23/22 0849   valbenazine (INGREZZA) capsule 40 mg  40 mg Oral q AM [name], NP   40 mg at 02/23/22 5409   Vitamin D (Ergocalciferol) (DRISDOL) 1.25 MG (50000 UNIT) capsule 50,000 Units  50,000 Units Oral Q Leonie Green, NP   50,000 Units at 02/18/22 8119   Current Outpatient Medications  Medication Sig Dispense Refill   Accu-Chek Softclix Lancets  lancets Use as directed up to four times daily 100 each 0   apixaban (ELIQUIS) 5 MG TABS tablet Take 1 tablet (5 mg total) by mouth 2 (two) times daily. 60 tablet 0   ARIPiprazole (ABILIFY) 10 MG tablet Take 1 tablet (10 mg total) by mouth daily. 30 tablet 0   Blood Glucose Monitoring Suppl (ACCU-CHEK GUIDE) w/Device KIT Use as directed up to four times daily 1 kit 0   budesonide-formoterol (SYMBICORT) 160-4.5 MCG/ACT inhaler Inhale 2 puffs into the lungs in the morning and at bedtime.     Cholecalciferol (VITAMIN D3) 1.25 MG (50000 UT) CAPS Take 50,000 Units by mouth every Thursday.     clozapine (CLOZARIL) 50 MG tablet Take 1 tablet (50 mg total) by mouth daily. 30 tablet 0   colchicine 0.6 MG tablet Take 1 tablet (0.6 mg total) by mouth at bedtime. 30 tablet 0   diltiazem (CARDIZEM CD) 240 MG 24 hr capsule Take 1 capsule (240 mg total) by mouth daily. (Patient not taking: Reported on 11/24/2021) 30 capsule 0   divalproex (DEPAKOTE ER) 500 MG 24 hr tablet Take 1 tablet (500 mg total) by mouth 2 (two) times daily. 60 tablet 0   glucose blood test strip Use as directed up to four times daily 50 each 0   haloperidol (HALDOL) 10 MG tablet Take 1 tablet (10 mg total) by mouth 3 (three) times daily as needed for agitation (and psychotic symptoms).     INGREZZA 40 MG capsule Take 1 capsule (40 mg total) by mouth in the morning. 30 capsule 0   insulin aspart (NOVOLOG) 100 UNIT/ML FlexPen Before each meal 3 times a day, 140-199  - 2 units, 200-250 - 4 units, 251-299 - 6 units,  300-349 - 8 units,  350 or above 10 units. Insulin PEN if approved, provide syringes and needles if needed.Please switch to any approved short acting Insulin if needed. 15 mL 0   insulin glargine (LANTUS) 100 UNIT/ML Solostar Pen Inject 12 Units into the skin daily. 15 mL 0   Insulin Pen Needle 32G X 4 MM MISC Use 4 times a day with insulin, 1 month supply. 100 each 0   latanoprost (XALATAN) 0.005 % ophthalmic solution Place 1 drop into both eyes at bedtime.     metFORMIN (GLUCOPHAGE) 500 MG tablet Take 500 mg by mouth 2 (two) times daily with a meal.     nicotine (NICODERM CQ - DOSED IN MG/24 HR) 7 mg/24hr patch Place 1 patch (7 mg total) onto the skin daily. 28 patch 0   PROAIR HFA 108 (90 Base) MCG/ACT inhaler Inhale 2 puffs into the lungs every 6 (six) hours as needed for wheezing or shortness of breath.      Labs  Lab Results:  Admission on 02/18/2022  Component Date Value Ref Range Status   Glucose-Capillary 02/18/2022 254 (H)  70 - 99 mg/dL Final   Glucose reference range applies only to samples taken after fasting for at least 8 hours.   Glucose-Capillary 02/18/2022 112 (H)  70 - 99 mg/dL Final   Glucose reference range applies only to samples taken after fasting for at least 8 hours.   Glucose-Capillary 02/19/2022 159 (H)  70 - 99 mg/dL Final   Glucose reference range applies only to samples taken after fasting for at least 8 hours.   Glucose-Capillary 02/19/2022 160 (H)  70 - 99 mg/dL Final   Glucose reference range applies only to samples taken after fasting for at least  8 hours.   Glucose-Capillary 02/19/2022 177 (H)  70 - 99 mg/dL Final   Glucose reference range applies only to samples taken after fasting for at least 8 hours.   Glucose-Capillary 02/19/2022 147 (H)  70 - 99 mg/dL Final   Glucose reference range applies only to samples taken after fasting for at least 8 hours.   Glucose-Capillary 02/20/2022 126 (H)  70 - 99 mg/dL  Final   Glucose reference range applies only to samples taken after fasting for at least 8 hours.   Glucose-Capillary 02/20/2022 132 (H)  70 - 99 mg/dL Final   Glucose reference range applies only to samples taken after fasting for at least 8 hours.   Glucose-Capillary 02/20/2022 125 (H)  70 - 99 mg/dL Final   Glucose reference range applies only to samples taken after fasting for at least 8 hours.   Glucose-Capillary 02/21/2022 151 (H)  70 - 99 mg/dL Final   Glucose reference range applies only to samples taken after fasting for at least 8 hours.   Glucose-Capillary 02/21/2022 106 (H)  70 - 99 mg/dL Final   Glucose reference range applies only to samples taken after fasting for at least 8 hours.   Glucose-Capillary 02/21/2022 149 (H)  70 - 99 mg/dL Final   Glucose reference range applies only to samples taken after fasting for at least 8 hours.   Glucose-Capillary 02/22/2022 131 (H)  70 - 99 mg/dL Final   Glucose reference range applies only to samples taken after fasting for at least 8 hours.   Glucose-Capillary 02/22/2022 141 (H)  70 - 99 mg/dL Final   Glucose reference range applies only to samples taken after fasting for at least 8 hours.   Glucose-Capillary 02/22/2022 183 (H)  70 - 99 mg/dL Final   Glucose reference range applies only to samples taken after fasting for at least 8 hours.   Glucose-Capillary 02/23/2022 133 (H)  70 - 99 mg/dL Final   Glucose reference range applies only to samples taken after fasting for at least 8 hours.  Admission on 02/17/2022, Discharged on 02/18/2022  Component Date Value Ref Range Status   Sodium 02/17/2022 136  135 - 145 mmol/L Final   Potassium 02/17/2022 3.9  3.5 - 5.1 mmol/L Final   Chloride 02/17/2022 101  98 - 111 mmol/L Final   CO2 02/17/2022 25  22 - 32 mmol/L Final   Glucose, Bld 02/17/2022 113 (H)  70 - 99 mg/dL Final   Glucose reference range applies only to samples taken after fasting for at least 8 hours.   BUN 02/17/2022 9  6 - 20  mg/dL Final   Creatinine, Ser 02/17/2022 0.50  0.44 - 1.00 mg/dL Final   Calcium 02/17/2022 8.9  8.9 - 10.3 mg/dL Final   GFR, Estimated 02/17/2022 >60  >60 mL/min Final   Comment: (NOTE) Calculated using the CKD-EPI Creatinine Equation (2021)    Anion gap 02/17/2022 10  5 - 15 Final   Performed at [facility], [place] [address], [place], [place] [address]   WBC 02/17/2022 6.5  4.0 - 10.5 K/uL Final   RBC 02/17/2022 4.24  3.87 - 5.11 MIL/uL Final   Hemoglobin 02/17/2022 10.2 (L)  12.0 - 15.0 g/dL Final   HCT 02/17/2022 33.2 (L)  36.0 - 46.0 % Final   MCV 02/17/2022 78.3 (L)  80.0 - 100.0 fL Final   MCH 02/17/2022 24.1 (L)  26.0 - 34.0 pg Final   MCHC 02/17/2022 30.7  30.0 -  36.0 g/dL Final   RDW 02/17/2022 17.2 (H)  11.5 - 15.5 % Final   Platelets 02/17/2022 390  150 - 400 K/uL Final   nRBC 02/17/2022 0.0  0.0 - 0.2 % Final   Performed at [facility], [place] [address] [place], [place] [address]   Troponin I (High Sensitivity) 02/17/2022 4  <18 ng/L Final   Comment: (NOTE) Elevated high sensitivity troponin I (hsTnI) values and significant  changes across serial measurements may suggest ACS but many other  chronic and acute conditions are known to elevate hsTnI results.  Refer to the "Links" section for chest pain algorithms and additional  guidance. Performed at [facility], [place] [address], [place], [place] [address]    Valproic Acid Lvl 02/17/2022 17 (L)  50.0 - 100.0 ug/mL Final   Performed at [place] [address], [place], [place] [address]   B Natriuretic Peptide 02/17/2022 30.9  0.0 - 100.0 pg/mL Final   Performed at [place] [address] [place], [place] [address]   Troponin I (High Sensitivity) 02/18/2022 4  <18 ng/L Final   Comment: (NOTE) Elevated high sensitivity troponin I (hsTnI) values and significant  changes across serial measurements may suggest ACS but many other  chronic and acute conditions are known to elevate  hsTnI results.  Refer to the "Links" section for chest pain algorithms and additional  guidance. Performed at [facility], [place] [address], [place],  [address]    D-Dimer, Quant 02/18/2022 0.29  0.00 - 0.50 ug/mL-FEU Final   Comment: (NOTE) At the manufacturer cut-off value of 0.5 g/mL FEU, this assay has a negative predictive value of 95-100%.This assay is intended for use in conjunction with a clinical pretest probability (PTP) assessment model to exclude pulmonary embolism (PE) and deep venous thrombosis (DVT) in outpatients suspected of PE or DVT. Results should be correlated with clinical presentation. Performed at [facility], [place] [address] [place],  [address]   Admission on 02/09/2022, Discharged on 02/17/2022  Component Date Value Ref Range Status   Glucose-Capillary 02/09/2022 118 (H)  70 - 99 mg/dL Final   Glucose reference range applies only to samples taken after fasting for at least 8 hours.   Glucose-Capillary 02/10/2022 148 (H)  70 - 99 mg/dL Final   Glucose reference range applies only to samples taken after fasting for at least 8 hours.   Glucose-Capillary 02/10/2022 174 (H)  70 - 99 mg/dL Final   Glucose reference range applies only to samples taken after fasting for at least 8 hours.   Glucose-Capillary 02/10/2022 128 (H)  70 - 99 mg/dL Final   Glucose reference range applies only to samples taken after fasting for at least 8 hours.   Glucose-Capillary 02/10/2022 182 (H)  70 - 99 mg/dL Final   Glucose reference range applies only to samples taken after fasting for at least 8 hours.   Glucose-Capillary 02/11/2022 158 (H)  70 - 99 mg/dL Final   Glucose reference range applies only to samples taken after fasting for at least 8 hours.   Glucose-Capillary 02/11/2022 159 (H)  70 - 99 mg/dL Final   Glucose reference range applies only to samples taken after fasting for at least 8 hours.   Glucose-Capillary 02/11/2022 153 (H)  70 - 99 mg/dL  Final   Glucose reference range applies only to samples taken after fasting for at least 8 hours.   Glucose-Capillary 02/11/2022 159 (H)  70 - 99 mg/dL Final   Glucose reference range applies only to samples taken after fasting for at least 8 hours.   Glucose-Capillary 02/11/2022 153 (H)  70 - 99 mg/dL Final   Glucose reference range applies only to samples taken after fasting for at least 8 hours.   Glucose-Capillary 02/11/2022 109 (H)  70 - 99 mg/dL Final   Glucose reference range applies only to samples taken after fasting for at least 8 hours.   Glucose-Capillary 02/11/2022 213 (H)  70 - 99 mg/dL Final   Glucose reference range applies only to samples taken after fasting for at least 8 hours.   Glucose-Capillary 02/11/2022 229 (H)  70 - 99 mg/dL Final   Glucose reference range applies only to samples taken after fasting for at least 8 hours.   Glucose-Capillary 02/12/2022 128 (H)  70 - 99 mg/dL Final   Glucose reference range applies only to samples taken after fasting for at least 8 hours.   Glucose-Capillary 02/12/2022 149 (H)  70 - 99 mg/dL Final   Glucose reference range applies only to samples taken after fasting for at least 8 hours.   Color, Urine 02/12/2022 YELLOW  YELLOW Final   APPearance 02/12/2022 CLEAR  CLEAR Final   Specific Gravity, Urine 02/12/2022 1.015  1.005 - 1.030 Final   pH 02/12/2022 5.0  5.0 - 8.0 Final   Glucose, UA 02/12/2022 NEGATIVE  NEGATIVE mg/dL Final   Hgb urine dipstick 02/12/2022 NEGATIVE  NEGATIVE Final   Bilirubin Urine 02/12/2022 NEGATIVE  NEGATIVE Final   Ketones, ur 02/12/2022 NEGATIVE  NEGATIVE mg/dL Final   Protein, ur 02/12/2022 NEGATIVE  NEGATIVE mg/dL Final   Nitrite 02/12/2022 NEGATIVE  NEGATIVE Final   Leukocytes,Ua 02/12/2022 NEGATIVE  NEGATIVE Final   Performed at [facility], [place] [address], [place], [place] [address]   Specimen Description 02/12/2022 URINE, CLEAN CATCH   Final   Special Requests 02/12/2022    Final                    Value:NONE Performed at [facility], [place] [address], [place], [place] [address]    Culture 02/12/2022 10,000 COLONIES/mL PROTEUS MIRABILIS (A)   Final   Report Status 02/12/2022 02/15/2022 FINAL   Final   Organism ID, Bacteria 02/12/2022 PROTEUS MIRABILIS (A)   Final   Glucose-Capillary 02/12/2022 128 (H)  70 - 99 mg/dL Final   Glucose reference range applies only to samples taken after fasting for at least 8 hours.   Glucose-Capillary 02/12/2022 196 (H)  70 - 99 mg/dL Final   Glucose reference range applies only to samples taken after fasting for at least 8 hours.   Glucose-Capillary 02/13/2022 168 (H)  70 - 99 mg/dL Final   Glucose reference range applies only to samples taken after fasting for at least 8 hours.   Glucose-Capillary 02/13/2022 153 (H)  70 - 99 mg/dL Final   Glucose reference range applies only to samples taken after fasting for at least 8 hours.   Glucose-Capillary 02/13/2022 134 (H)  70 - 99 mg/dL Final   Glucose reference range applies only to samples taken after fasting for at least 8 hours.   Glucose-Capillary 02/13/2022 178 (H)  70 - 99 mg/dL Final   Glucose reference range applies only to samples taken after fasting for at least 8 hours.   Glucose-Capillary 02/14/2022 156 (H)  70 - 99 mg/dL Final   Glucose reference range applies only to samples taken after  fasting for at least 8 hours.   Glucose-Capillary 02/14/2022 169 (H)  70 - 99 mg/dL Final   Glucose reference range applies only to samples taken after fasting for at least 8 hours.   Glucose-Capillary 02/14/2022 156 (H)  70 - 99 mg/dL Final   Glucose reference range applies only to samples taken after fasting for at least 8 hours.   Glucose-Capillary 02/14/2022 148 (H)  70 - 99 mg/dL Final   Glucose reference range applies only to samples taken after fasting for at least 8 hours.   Glucose-Capillary 02/15/2022 159 (H)  70 - 99 mg/dL Final   Glucose reference range applies only to samples taken after  fasting for at least 8 hours.   Glucose-Capillary 02/15/2022 125 (H)  70 - 99 mg/dL Final   Glucose reference range applies only to samples taken after fasting for at least 8 hours.   Glucose-Capillary 02/15/2022 142 (H)  70 - 99 mg/dL Final   Glucose reference range applies only to samples taken after fasting for at least 8 hours.   Glucose-Capillary 02/15/2022 128 (H)  70 - 99 mg/dL Final   Glucose reference range applies only to samples taken after fasting for at least 8 hours.   Glucose-Capillary 02/16/2022 137 (H)  70 - 99 mg/dL Final   Glucose reference range applies only to samples taken after fasting for at least 8 hours.   WBC 02/16/2022 6.0  4.0 - 10.5 K/uL Final   RBC 02/16/2022 4.48  3.87 - 5.11 MIL/uL Final   Hemoglobin 02/16/2022 10.7 (L)  12.0 - 15.0 g/dL Final   HCT 02/16/2022 35.4 (L)  36.0 - 46.0 % Final   MCV 02/16/2022 79.0 (L)  80.0 - 100.0 fL Final   MCH 02/16/2022 23.9 (L)  26.0 - 34.0 pg Final   MCHC 02/16/2022 30.2  30.0 - 36.0 g/dL Final   RDW 02/16/2022 17.0 (H)  11.5 - 15.5 % Final   Platelets 02/16/2022 387  150 - 400 K/uL Final   nRBC 02/16/2022 0.0  0.0 - 0.2 % Final   Neutrophils Relative % 02/16/2022 54  % Final   Neutro Abs 02/16/2022 3.2  1.7 - 7.7 K/uL Final   Lymphocytes Relative 02/16/2022 35  % Final   Lymphs Abs 02/16/2022 2.1  0.7 - 4.0 K/uL Final   Monocytes Relative 02/16/2022 6  % Final   Monocytes Absolute 02/16/2022 0.4  0.1 - 1.0 K/uL Final   Eosinophils Relative 02/16/2022 2  % Final   Eosinophils Absolute 02/16/2022 0.1  0.0 - 0.5 K/uL Final   Basophils Relative 02/16/2022 2  % Final   Basophils Absolute 02/16/2022 0.1  0.0 - 0.1 K/uL Final   Immature Granulocytes 02/16/2022 1  % Final   Abs Immature Granulocytes 02/16/2022 0.04  0.00 - 0.07 K/uL Final   Performed at [facility], [place] [address] [place], [place] [address]   Glucose-Capillary 02/16/2022 131 (H)  70 - 99 mg/dL Final   Glucose reference range applies only to  samples taken after fasting for at least 8 hours.   Glucose-Capillary 02/16/2022 123 (H)  70 - 99 mg/dL Final   Glucose reference range applies only to samples taken after fasting for at least 8 hours.   Glucose-Capillary 02/16/2022 195 (H)  70 - 99 mg/dL Final   Glucose reference range applies only to samples taken after fasting for at least 8 hours.   Glucose-Capillary 02/17/2022 148 (H)  70 - 99 mg/dL Final   Glucose  reference range applies only to samples taken after fasting for at least 8 hours.  Admission on 02/08/2022, Discharged on 02/09/2022  Component Date Value Ref Range Status   B Natriuretic Peptide 02/08/2022 40.2  0.0 - 100.0 pg/mL Final   Performed at [facility], [address] [place], [place] [address]   Troponin I (High Sensitivity) 02/08/2022 4  <18 ng/L Final   Comment: (NOTE) Elevated high sensitivity troponin I (hsTnI) values and significant  changes across serial measurements may suggest ACS but many other  chronic and acute conditions are known to elevate hsTnI results.  Refer to the "Links" section for chest pain algorithms and additional  guidance. Performed at [facility], [place] [address] [place], [place] [address]    WBC 02/08/2022 8.5  4.0 - 10.5 K/uL Final   RBC 02/08/2022 4.30  3.87 - 5.11 MIL/uL Final   Hemoglobin 02/08/2022 10.7 (L)  12.0 - 15.0 g/dL Final   HCT 02/08/2022 33.1 (L)  36.0 - 46.0 % Final   MCV 02/08/2022 77.0 (L)  80.0 - 100.0 fL Final   MCH 02/08/2022 24.9 (L)  26.0 - 34.0 pg Final   MCHC 02/08/2022 32.3  30.0 - 36.0 g/dL Final   RDW 02/08/2022 16.5 (H)  11.5 - 15.5 % Final   Platelets 02/08/2022 361  150 - 400 K/uL Final   nRBC 02/08/2022 0.0  0.0 - 0.2 % Final   Performed at [place] [address], [place], [place] [address]   Sodium 02/08/2022 131 (L)  135 - 145 mmol/L Final   Potassium 02/08/2022 4.4  3.5 - 5.1 mmol/L Final   Chloride 02/08/2022 96 (L)  98 - 111 mmol/L Final   CO2 02/08/2022 25  22 -  32 mmol/L Final   Glucose, Bld 02/08/2022 126 (H)  70 - 99 mg/dL Final   Glucose reference range applies only to samples taken after fasting for at least 8 hours.   BUN 02/08/2022 11  6 - 20 mg/dL Final   Creatinine, Ser 02/08/2022 0.52  0.44 - 1.00 mg/dL Final   Calcium 02/08/2022 9.4  8.9 - 10.3 mg/dL Final   Total Protein 02/08/2022 7.5  6.5 - 8.1 g/dL Final   Albumin 02/08/2022 3.6  3.5 - 5.0 g/dL Final   AST 02/08/2022 22  15 - 41 U/L Final   ALT 02/08/2022 20  0 - 44 U/L Final   Alkaline Phosphatase 02/08/2022 67  38 - 126 U/L Final   Total Bilirubin 02/08/2022 0.1 (L)  0.3 - 1.2 mg/dL Final   GFR, Estimated 02/08/2022 >60  >60 mL/min Final   Comment: (NOTE) Calculated using the CKD-EPI Creatinine Equation (2021)    Anion gap 02/08/2022 10  5 - 15 Final   Performed at [facility] [address], [place], [place] [address]   Troponin I (High Sensitivity) 02/08/2022 5  <18 ng/L Final   Comment: (NOTE) Elevated high sensitivity troponin I (hsTnI) values and significant  changes across serial measurements may suggest ACS but many other  chronic and acute conditions are known to elevate hsTnI results.  Refer to the "Links" section for chest pain algorithms and additional  guidance. Performed at [facility], [place] [address], [place], [place] [address]    WBC 02/09/2022 7.3  4.0 - 10.5 K/uL Final   RBC 02/09/2022 4.70  3.87 - 5.11 MIL/uL Final   Hemoglobin 02/09/2022 11.4 (L)  12.0 - 15.0 g/dL Final  HCT 02/09/2022 36.6  36.0 - 46.0 % Final   MCV 02/09/2022 77.9 (L)  80.0 - 100.0 fL Final   MCH 02/09/2022 24.3 (L)  26.0 - 34.0 pg Final   MCHC 02/09/2022 31.1  30.0 - 36.0 g/dL Final   RDW 02/09/2022 16.6 (H)  11.5 - 15.5 % Final   Platelets 02/09/2022 403 (H)  150 - 400 K/uL Final   nRBC 02/09/2022 0.0  0.0 - 0.2 % Final   Performed at [facility], [place] [address] [place],  [address]   Glucose-Capillary 02/08/2022 117 (H)  70 - 99 mg/dL Final    Glucose reference range applies only to samples taken after fasting for at least 8 hours.   Sodium 02/09/2022 138  135 - 145 mmol/L Final   Potassium 02/09/2022 3.7  3.5 - 5.1 mmol/L Final   Chloride 02/09/2022 103  98 - 111 mmol/L Final   CO2 02/09/2022 24  22 - 32 mmol/L Final   Glucose, Bld 02/09/2022 134 (H)  70 - 99 mg/dL Final   Glucose reference range applies only to samples taken after fasting for at least 8 hours.   BUN 02/09/2022 9  6 - 20 mg/dL Final   Creatinine, Ser 02/09/2022 0.44  0.44 - 1.00 mg/dL Final   Calcium 02/09/2022 8.3 (L)  8.9 - 10.3 mg/dL Final   GFR, Estimated 02/09/2022 >60  >60 mL/min Final   Comment: (NOTE) Calculated using the CKD-EPI Creatinine Equation (2021)    Anion gap 02/09/2022 11  5 - 15 Final   Performed at [facility], [place] [address] [place], [place] [address]   Iron 02/09/2022 20 (L)  28 - 170 ug/dL Final   TIBC 02/09/2022 455 (H)  250 - 450 ug/dL Final   Saturation Ratios 02/09/2022 4 (L)  10.4 - 31.8 % Final   UIBC 02/09/2022 435  ug/dL Final   Performed at [facility], [place] [address], [place], [place] [address]   Ferritin 02/09/2022 2 (L)  11 - 307 ng/mL Final   Performed at [facility] [address] [place],  [address]   Weight 02/09/2022 2,846.58  oz Final   Height 02/09/2022 62  in Final   BP 02/09/2022 143/80  mmHg Final   WBC 02/09/2022 8.1  4.0 - 10.5 K/uL Final   RBC 02/09/2022 4.38  3.87 - 5.11 MIL/uL Final   Hemoglobin 02/09/2022 10.9 (L)  12.0 - 15.0 g/dL Final   HCT 02/09/2022 33.3 (L)  36.0 - 46.0 % Final   MCV 02/09/2022 76.0 (L)  80.0 - 100.0 fL Final   MCH 02/09/2022 24.9 (L)  26.0 - 34.0 pg Final   MCHC 02/09/2022 32.7  30.0 - 36.0 g/dL Final   RDW 02/09/2022 16.5 (H)  11.5 - 15.5 % Final   Platelets 02/09/2022 397  150 - 400 K/uL Final   nRBC 02/09/2022 0.0  0.0 - 0.2 % Final   Neutrophils Relative % 02/09/2022 66  % Final   Neutro Abs 02/09/2022 5.4  1.7 - 7.7 K/uL Final   Lymphocytes  Relative 02/09/2022 24  % Final   Lymphs Abs 02/09/2022 1.9  0.7 - 4.0 K/uL Final   Monocytes Relative 02/09/2022 8  % Final   Monocytes Absolute 02/09/2022 0.6  0.1 - 1.0 K/uL Final   Eosinophils Relative 02/09/2022 1  % Final   Eosinophils Absolute 02/09/2022 0.1  0.0 - 0.5 K/uL Final   Basophils Relative 02/09/2022 1  %  Final   Basophils Absolute 02/09/2022 0.1  0.0 - 0.1 K/uL Final   Immature Granulocytes 02/09/2022 0  % Final   Abs Immature Granulocytes 02/09/2022 0.03  0.00 - 0.07 K/uL Final   Performed at [facility], [place] [address], [place], [place] [address]   Glucose-Capillary 02/09/2022 238 (H)  70 - 99 mg/dL Final   Glucose reference range applies only to samples taken after fasting for at least 8 hours.   Glucose-Capillary 02/09/2022 91  70 - 99 mg/dL Final   Glucose reference range applies only to samples taken after fasting for at least 8 hours.   CRP 02/09/2022 <0.5  <1.0 mg/dL Final   Performed at [facility] [address], [place], [place] [address]   Sed Rate 02/09/2022 15  0 - 22 mm/hr Final   Performed at [facility] [address], [place], [place] [address]   Glucose-Capillary 02/09/2022 137 (H)  70 - 99 mg/dL Final   Glucose reference range applies only to samples taken after fasting for at least 8 hours.  Admission on 01/24/2022, Discharged on 02/08/2022  Component Date Value Ref Range Status   Glucose-Capillary 01/24/2022 143 (H)  70 - 99 mg/dL Final   Glucose reference range applies only to samples taken after fasting for at least 8 hours.   Glucose-Capillary 01/24/2022 120 (H)  70 - 99 mg/dL Final   Glucose reference range applies only to samples taken after fasting for at least 8 hours.   Glucose-Capillary 01/25/2022 122 (H)  70 - 99 mg/dL Final   Glucose reference range applies only to samples taken after fasting for at least 8 hours.   Glucose-Capillary 01/25/2022 190 (H)  70 - 99 mg/dL Final   Glucose reference range applies only  to samples taken after fasting for at least 8 hours.   Glucose-Capillary 01/25/2022 163 (H)  70 - 99 mg/dL Final   Glucose reference range applies only to samples taken after fasting for at least 8 hours.   WBC 01/26/2022 6.5  4.0 - 10.5 K/uL Final   RBC 01/26/2022 4.53  3.87 - 5.11 MIL/uL Final   Hemoglobin 01/26/2022 11.3 (L)  12.0 - 15.0 g/dL Final   HCT 01/26/2022 36.4  36.0 - 46.0 % Final   MCV 01/26/2022 80.4  80.0 - 100.0 fL Final   MCH 01/26/2022 24.9 (L)  26.0 - 34.0 pg Final   MCHC 01/26/2022 31.0  30.0 - 36.0 g/dL Final   RDW 01/26/2022 17.3 (H)  11.5 - 15.5 % Final   Platelets 01/26/2022 327  150 - 400 K/uL Final   nRBC 01/26/2022 0.0  0.0 - 0.2 % Final   Neutrophils Relative % 01/26/2022 60  % Final   Neutro Abs 01/26/2022 3.9  1.7 - 7.7 K/uL Final   Lymphocytes Relative 01/26/2022 31  % Final   Lymphs Abs 01/26/2022 2.0  0.7 - 4.0 K/uL Final   Monocytes Relative 01/26/2022 7  % Final   Monocytes Absolute 01/26/2022 0.5  0.1 - 1.0 K/uL Final   Eosinophils Relative 01/26/2022 1  % Final   Eosinophils Absolute 01/26/2022 0.1  0.0 - 0.5 K/uL Final   Basophils Relative 01/26/2022 1  % Final   Basophils Absolute 01/26/2022 0.1  0.0 - 0.1 K/uL Final   Immature Granulocytes 01/26/2022 0  % Final   Abs Immature Granulocytes 01/26/2022 0.02  0.00 - 0.07 K/uL Final   Performed at [facility], [place] [address],  [place], [place] [address]   Glucose-Capillary 01/26/2022 149 (H)  70 - 99 mg/dL Final   Glucose reference range applies only to samples taken after fasting for at least 8 hours.   Glucose-Capillary 01/26/2022 130 (H)  70 - 99 mg/dL Final   Glucose reference range applies only to samples taken after fasting for at least 8 hours.   Glucose-Capillary 01/26/2022 151 (H)  70 - 99 mg/dL Final   Glucose reference range applies only to samples taken after fasting for at least 8 hours.   Glucose-Capillary 01/26/2022 170 (H)  70 - 99 mg/dL Final   Glucose reference range applies  only to samples taken after fasting for at least 8 hours.   Glucose-Capillary 01/27/2022 129 (H)  70 - 99 mg/dL Final   Glucose reference range applies only to samples taken after fasting for at least 8 hours.   Glucose-Capillary 01/27/2022 101 (H)  70 - 99 mg/dL Final   Glucose reference range applies only to samples taken after fasting for at least 8 hours.   Glucose-Capillary 01/27/2022 220 (H)  70 - 99 mg/dL Final   Glucose reference range applies only to samples taken after fasting for at least 8 hours.   Glucose-Capillary 01/27/2022 154 (H)  70 - 99 mg/dL Final   Glucose reference range applies only to samples taken after fasting for at least 8 hours.   Glucose-Capillary 01/27/2022 163 (H)  70 - 99 mg/dL Final   Glucose reference range applies only to samples taken after fasting for at least 8 hours.   Glucose-Capillary 01/28/2022 127 (H)  70 - 99 mg/dL Final   Glucose reference range applies only to samples taken after fasting for at least 8 hours.   Glucose-Capillary 01/28/2022 110 (H)  70 - 99 mg/dL Final   Glucose reference range applies only to samples taken after fasting for at least 8 hours.   Glucose-Capillary 01/28/2022 167 (H)  70 - 99 mg/dL Final   Glucose reference range applies only to samples taken after fasting for at least 8 hours.   Glucose-Capillary 01/29/2022 145 (H)  70 - 99 mg/dL Final   Glucose reference range applies only to samples taken after fasting for at least 8 hours.   Glucose-Capillary 01/29/2022 203 (H)  70 - 99 mg/dL Final   Glucose reference range applies only to samples taken after fasting for at least 8 hours.   Glucose-Capillary 01/29/2022 153 (H)  70 - 99 mg/dL Final   Glucose reference range applies only to samples taken after fasting for at least 8 hours.   Glucose-Capillary 01/29/2022 166 (H)  70 - 99 mg/dL Final   Glucose reference range applies only to samples taken after fasting for at least 8 hours.   Glucose-Capillary 01/30/2022 142 (H)  70  - 99 mg/dL Final   Glucose reference range applies only to samples taken after fasting for at least 8 hours.   Glucose-Capillary 01/30/2022 138 (H)  70 - 99 mg/dL Final   Glucose reference range applies only to samples taken after fasting for at least 8 hours.   Glucose-Capillary 01/30/2022 185 (H)  70 - 99 mg/dL Final   Glucose reference range applies only to samples taken after fasting for at least 8 hours.   Glucose-Capillary 01/30/2022 220 (H)  70 - 99 mg/dL Final   Glucose reference range applies only to samples taken after fasting for at least 8 hours.   Glucose-Capillary 01/31/2022 147 (H)  70 - 99 mg/dL Final   Glucose  reference range applies only to samples taken after fasting for at least 8 hours.   Glucose-Capillary 01/31/2022 131 (H)  70 - 99 mg/dL Final   Glucose reference range applies only to samples taken after fasting for at least 8 hours.   Glucose-Capillary 01/31/2022 169 (H)  70 - 99 mg/dL Final   Glucose reference range applies only to samples taken after fasting for at least 8 hours.   Glucose-Capillary 01/31/2022 144 (H)  70 - 99 mg/dL Final   Glucose reference range applies only to samples taken after fasting for at least 8 hours.   Comment 1 01/31/2022 RN AWARE   Final   Glucose-Capillary 02/01/2022 117 (H)  70 - 99 mg/dL Final   Glucose reference range applies only to samples taken after fasting for at least 8 hours.   Glucose-Capillary 02/01/2022 180 (H)  70 - 99 mg/dL Final   Glucose reference range applies only to samples taken after fasting for at least 8 hours.   Glucose-Capillary 02/01/2022 209 (H)  70 - 99 mg/dL Final   Glucose reference range applies only to samples taken after fasting for at least 8 hours.   Glucose-Capillary 02/02/2022 131 (H)  70 - 99 mg/dL Final   Glucose reference range applies only to samples taken after fasting for at least 8 hours.   WBC 02/02/2022 7.5  4.0 - 10.5 K/uL Final   RBC 02/02/2022 4.52  3.87 - 5.11 MIL/uL Final    Hemoglobin 02/02/2022 11.3 (L)  12.0 - 15.0 g/dL Final   HCT 02/02/2022 35.0 (L)  36.0 - 46.0 % Final   MCV 02/02/2022 77.4 (L)  80.0 - 100.0 fL Final   MCH 02/02/2022 25.0 (L)  26.0 - 34.0 pg Final   MCHC 02/02/2022 32.3  30.0 - 36.0 g/dL Final   RDW 02/02/2022 16.7 (H)  11.5 - 15.5 % Final   Platelets 02/02/2022 345  150 - 400 K/uL Final   nRBC 02/02/2022 0.0  0.0 - 0.2 % Final   Neutrophils Relative % 02/02/2022 63  % Final   Neutro Abs 02/02/2022 4.7  1.7 - 7.7 K/uL Final   Lymphocytes Relative 02/02/2022 28  % Final   Lymphs Abs 02/02/2022 2.1  0.7 - 4.0 K/uL Final   Monocytes Relative 02/02/2022 7  % Final   Monocytes Absolute 02/02/2022 0.5  0.1 - 1.0 K/uL Final   Eosinophils Relative 02/02/2022 1  % Final   Eosinophils Absolute 02/02/2022 0.1  0.0 - 0.5 K/uL Final   Basophils Relative 02/02/2022 1  % Final   Basophils Absolute 02/02/2022 0.1  0.0 - 0.1 K/uL Final   Immature Granulocytes 02/02/2022 0  % Final   Abs Immature Granulocytes 02/02/2022 0.03  0.00 - 0.07 K/uL Final   Performed at [facility], [place] [address], [place], [place] [address]   Glucose-Capillary 02/02/2022 95  70 - 99 mg/dL Final   Glucose reference range applies only to samples taken after fasting for at least 8 hours.   Glucose-Capillary 02/02/2022 120 (H)  70 - 99 mg/dL Final   Glucose reference range applies only to samples taken after fasting for at least 8 hours.   Glucose-Capillary 02/02/2022 191 (H)  70 - 99 mg/dL Final   Glucose reference range applies only to samples taken after fasting for at least 8 hours.   Glucose-Capillary 02/03/2022 148 (H)  70 - 99 mg/dL Final   Glucose reference range applies only to samples taken after fasting for at least 8  hours.   Glucose-Capillary 02/03/2022 122 (H)  70 - 99 mg/dL Final   Glucose reference range applies only to samples taken after fasting for at least 8 hours.   Glucose-Capillary 02/03/2022 233 (H)  70 - 99 mg/dL Final   Glucose reference range  applies only to samples taken after fasting for at least 8 hours.   Glucose-Capillary 02/04/2022 126 (H)  70 - 99 mg/dL Final   Glucose reference range applies only to samples taken after fasting for at least 8 hours.   Glucose-Capillary 02/04/2022 117 (H)  70 - 99 mg/dL Final   Glucose reference range applies only to samples taken after fasting for at least 8 hours.   Glucose-Capillary 02/04/2022 135 (H)  70 - 99 mg/dL Final   Glucose reference range applies only to samples taken after fasting for at least 8 hours.   Glucose-Capillary 02/04/2022 197 (H)  70 - 99 mg/dL Final   Glucose reference range applies only to samples taken after fasting for at least 8 hours.   Glucose-Capillary 02/05/2022 170 (H)  70 - 99 mg/dL Final   Glucose reference range applies only to samples taken after fasting for at least 8 hours.   Glucose-Capillary 02/05/2022 107 (H)  70 - 99 mg/dL Final   Glucose reference range applies only to samples taken after fasting for at least 8 hours.   Glucose-Capillary 02/05/2022 184 (H)  70 - 99 mg/dL Final   Glucose reference range applies only to samples taken after fasting for at least 8 hours.   Glucose-Capillary 02/05/2022 256 (H)  70 - 99 mg/dL Final   Glucose reference range applies only to samples taken after fasting for at least 8 hours.   Glucose-Capillary 02/06/2022 144 (H)  70 - 99 mg/dL Final   Glucose reference range applies only to samples taken after fasting for at least 8 hours.   Glucose-Capillary 02/06/2022 190 (H)  70 - 99 mg/dL Final   Glucose reference range applies only to samples taken after fasting for at least 8 hours.   Glucose-Capillary 02/06/2022 92  70 - 99 mg/dL Final   Glucose reference range applies only to samples taken after fasting for at least 8 hours.   Trichomonas 02/06/2022 Negative   Final   Bacterial Vaginitis-Urine 02/06/2022 Negative   Final   Molecular Comment 02/06/2022 For tests bacteria and/or candida, this specimen does not meet  the   Final   Molecular Comment 02/06/2022 strict criteria set by the FDA. The result interpretation should be   Final   Molecular Comment 02/06/2022 considered in conjunction with the patient's clinical history.   Final   Comment 02/06/2022 Normal Reference Range Trichomonas - Negative   Final   Glucose-Capillary 02/06/2022 197 (H)  70 - 99 mg/dL Final   Glucose reference range applies only to samples taken after fasting for at least 8 hours.   Glucose-Capillary 02/07/2022 129 (H)  70 - 99 mg/dL Final   Glucose reference range applies only to samples taken after fasting for at least 8 hours.   Glucose-Capillary 02/07/2022 207 (H)  70 - 99 mg/dL Final   Glucose reference range applies only to samples taken after fasting for at least 8 hours.   Glucose-Capillary 02/07/2022 153 (H)  70 - 99 mg/dL Final   Glucose reference range applies only to samples taken after fasting for at least 8 hours.   Glucose-Capillary 02/08/2022 129 (H)  70 - 99 mg/dL Final   Glucose reference range applies only to samples  taken after fasting for at least 8 hours.   Glucose-Capillary 02/08/2022 107 (H)  70 - 99 mg/dL Final   Glucose reference range applies only to samples taken after fasting for at least 8 hours.  Admission on 01/23/2022, Discharged on 01/24/2022  Component Date Value Ref Range Status   Sodium 01/23/2022 140  135 - 145 mmol/L Final   Potassium 01/23/2022 4.4  3.5 - 5.1 mmol/L Final   Chloride 01/23/2022 101  98 - 111 mmol/L Final   CO2 01/23/2022 25  22 - 32 mmol/L Final   Glucose, Bld 01/23/2022 198 (H)  70 - 99 mg/dL Final   Glucose reference range applies only to samples taken after fasting for at least 8 hours.   BUN 01/23/2022 13  6 - 20 mg/dL Final   Creatinine, Ser 01/23/2022 0.62  0.44 - 1.00 mg/dL Final   Calcium 01/23/2022 9.3  8.9 - 10.3 mg/dL Final   GFR, Estimated 01/23/2022 >60  >60 mL/min Final   Comment: (NOTE) Calculated using the CKD-EPI Creatinine Equation (2021)    Anion  gap 01/23/2022 14  5 - 15 Final   Performed at [facility], [place] [address], [place], [place] [address]   WBC 01/23/2022 5.8  4.0 - 10.5 K/uL Final   RBC 01/23/2022 4.63  3.87 - 5.11 MIL/uL Final   Hemoglobin 01/23/2022 11.7 (L)  12.0 - 15.0 g/dL Final   HCT 01/23/2022 37.4  36.0 - 46.0 % Final   MCV 01/23/2022 80.8  80.0 - 100.0 fL Final   MCH 01/23/2022 25.3 (L)  26.0 - 34.0 pg Final   MCHC 01/23/2022 31.3  30.0 - 36.0 g/dL Final   RDW 01/23/2022 17.9 (H)  11.5 - 15.5 % Final   Platelets 01/23/2022 333  150 - 400 K/uL Final   nRBC 01/23/2022 0.0  0.0 - 0.2 % Final   Performed at [facility] [address] [place], [place] [address]   Troponin I (High Sensitivity) 01/23/2022 6  <18 ng/L Final   Comment: (NOTE) Elevated high sensitivity troponin I (hsTnI) values and significant  changes across serial measurements may suggest ACS but many other  chronic and acute conditions are known to elevate hsTnI results.  Refer to the "Links" section for chest pain algorithms and additional  guidance. Performed at [facility], [place] [address] [place], [place] [address]    Troponin I (High Sensitivity) 01/23/2022 5  <18 ng/L Final   Comment: (NOTE) Elevated high sensitivity troponin I (hsTnI) values and significant  changes across serial measurements may suggest ACS but many other  chronic and acute conditions are known to elevate hsTnI results.  Refer to the "Links" section for chest pain algorithms and additional  guidance. Performed at [facility], [place] [address] [place], [place] [address]    SARS Coronavirus 2 by RT PCR 01/23/2022 NEGATIVE  NEGATIVE Final   Comment: (NOTE) SARS-CoV-2 target nucleic acids are NOT DETECTED.  The SARS-CoV-2 RNA is generally detectable in upper and lower respiratory specimens during the acute phase of infection. The lowest concentration of SARS-CoV-2 viral copies this assay can detect is 250 copies / mL. A negative result does  not preclude SARS-CoV-2 infection and should not be used as the sole basis for treatment or other patient management decisions.  A negative result may occur with improper specimen collection / handling, submission of specimen other than nasopharyngeal swab, presence of viral mutation(s) within the areas targeted by this assay,  and inadequate number of viral copies (<250 copies / mL). A negative result must be combined with clinical observations, patient history, and epidemiological information.  Fact Sheet for Patients:   https://www.patel.info/  Fact Sheet for Healthcare Providers: https://hall.com/  This test is not yet approved or                           cleared by the [place] FDA and has been authorized for detection and/or diagnosis of SARS-CoV-2 by FDA under an Emergency Use Authorization (EUA).  This EUA will remain in effect (meaning this test can be used) for the duration of the COVID-19 declaration under Section 564(b)(1) of the Act, 21 U.S.C. section 360bbb-3(b)(1), unless the authorization is terminated or revoked sooner.  Performed at [facility], [place] [address], [place], [place] [address]    B Natriuretic Peptide 01/23/2022 41.3  0.0 - 100.0 pg/mL Final   Performed at [facility] [address], [place], [place] [address]   Group A Strep by PCR 01/23/2022 NOT DETECTED  NOT DETECTED Final   Performed at [facility], [place] [address] [place], [place] [address]   Color, Urine 01/23/2022 YELLOW  YELLOW Final   APPearance 01/23/2022 CLEAR  CLEAR Final   Specific Gravity, Urine 01/23/2022 1.018  1.005 - 1.030 Final   pH 01/23/2022 7.0  5.0 - 8.0 Final   Glucose, UA 01/23/2022 NEGATIVE  NEGATIVE mg/dL Final   Hgb urine dipstick 01/23/2022 NEGATIVE  NEGATIVE Final   Bilirubin Urine 01/23/2022 NEGATIVE  NEGATIVE Final   Ketones, ur 01/23/2022 NEGATIVE  NEGATIVE mg/dL Final   Protein, ur 01/23/2022  NEGATIVE  NEGATIVE mg/dL Final   Nitrite 01/23/2022 NEGATIVE  NEGATIVE Final   Leukocytes,Ua 01/23/2022 TRACE (A)  NEGATIVE Final   RBC / HPF 01/23/2022 0-5  0 - 5 RBC/hpf Final   WBC, UA 01/23/2022 0-5  0 - 5 WBC/hpf Final   Bacteria, UA 01/23/2022 NONE SEEN  NONE SEEN Final   Squamous Epithelial / LPF 01/23/2022 0-5  0 - 5 Final   Mucus 01/23/2022 PRESENT   Final   Performed at [facility], [place] [address] [place], [place] [address]   SARS Coronavirus 2 by RT PCR 01/23/2022 NEGATIVE  NEGATIVE Final   Comment: (NOTE) SARS-CoV-2 target nucleic acids are NOT DETECTED.  The SARS-CoV-2 RNA is generally detectable in upper respiratory specimens during the acute phase of infection. The lowest concentration of SARS-CoV-2 viral copies this assay can detect is 138 copies/mL. A negative result does not preclude SARS-Cov-2 infection and should not be used as the sole basis for treatment or other patient management decisions. A negative result may occur with  improper specimen collection/handling, submission of specimen other than nasopharyngeal swab, presence of viral mutation(s) within the areas targeted by this assay, and inadequate number of viral copies(<138 copies/mL). A negative result must be combined with clinical observations, patient history, and epidemiological information. The expected result is Negative.  Fact Sheet for Patients:  EntrepreneurPulse.com.au  Fact Sheet for Healthcare Providers:  IncredibleEmployment.be  This test is no                          t yet approved or cleared by the [place] FDA and  has been authorized for detection and/or diagnosis of SARS-CoV-2 by FDA under an Emergency Use Authorization (EUA). This EUA will remain  in effect (meaning  this test can be used) for the duration of the COVID-19 declaration under Section 564(b)(1) of the Act, 21 U.S.C.section 360bbb-3(b)(1), unless the authorization is  terminated  or revoked sooner.       Influenza A by PCR 01/23/2022 NEGATIVE  NEGATIVE Final   Influenza B by PCR 01/23/2022 NEGATIVE  NEGATIVE Final   Comment: (NOTE) The Xpert Xpress SARS-CoV-2/FLU/RSV plus assay is intended as an aid in the diagnosis of influenza from Nasopharyngeal swab specimens and should not be used as a sole basis for treatment. Nasal washings and aspirates are unacceptable for Xpert Xpress SARS-CoV-2/FLU/RSV testing.  Fact Sheet for Patients: EntrepreneurPulse.com.au  Fact Sheet for Healthcare Providers: IncredibleEmployment.be  This test is not yet approved or cleared by the [place] FDA and has been authorized for detection and/or diagnosis of SARS-CoV-2 by FDA under an Emergency Use Authorization (EUA). This EUA will remain in effect (meaning this test can be used) for the duration of the COVID-19 declaration under Section 564(b)(1) of the Act, 21 U.S.C. section 360bbb-3(b)(1), unless the authorization is terminated or revoked.  Performed at [facility], [place] [address], [place], [place] [address]    WBC 01/24/2022 6.1  4.0 - 10.5 K/uL Final   RBC 01/24/2022 4.60  3.87 - 5.11 MIL/uL Final   Hemoglobin 01/24/2022 11.7 (L)  12.0 - 15.0 g/dL Final   HCT 01/24/2022 37.0  36.0 - 46.0 % Final   MCV 01/24/2022 80.4  80.0 - 100.0 fL Final   MCH 01/24/2022 25.4 (L)  26.0 - 34.0 pg Final   MCHC 01/24/2022 31.6  30.0 - 36.0 g/dL Final   RDW 01/24/2022 17.6 (H)  11.5 - 15.5 % Final   Platelets 01/24/2022 334  150 - 400 K/uL Final   nRBC 01/24/2022 0.0  0.0 - 0.2 % Final   Neutrophils Relative % 01/24/2022 53  % Final   Neutro Abs 01/24/2022 3.3  1.7 - 7.7 K/uL Final   Lymphocytes Relative 01/24/2022 33  % Final   Lymphs Abs 01/24/2022 2.0  0.7 - 4.0 K/uL Final   Monocytes Relative 01/24/2022 11  % Final   Monocytes Absolute 01/24/2022 0.7  0.1 - 1.0 K/uL Final   Eosinophils Relative 01/24/2022 2  % Final    Eosinophils Absolute 01/24/2022 0.1  0.0 - 0.5 K/uL Final   Basophils Relative 01/24/2022 1  % Final   Basophils Absolute 01/24/2022 0.1  0.0 - 0.1 K/uL Final   Immature Granulocytes 01/24/2022 0  % Final   Abs Immature Granulocytes 01/24/2022 0.02  0.00 - 0.07 K/uL Final   Performed at [facility], [place] [address], [place], [place] [address]   Glucose-Capillary 01/24/2022 110 (H)  70 - 99 mg/dL Final   Glucose reference range applies only to samples taken after fasting for at least 8 hours.  No results displayed because visit has over 200 results.    Admission on 11/22/2021, Discharged on 11/22/2021  Component Date Value Ref Range Status   Glucose-Capillary 11/22/2021 169 (H)  70 - 99 mg/dL Final   Glucose reference range applies only to samples taken after fasting for at least 8 hours.  No results displayed because visit has over 200 results.    Admission on 10/04/2021, Discharged on 10/22/2021  Component Date Value Ref Range Status   SARS Coronavirus 2 by RT PCR 10/04/2021 NEGATIVE  NEGATIVE Final   Comment: (NOTE) SARS-CoV-2 target nucleic acids are NOT DETECTED.  The SARS-CoV-2 RNA is generally detectable in upper respiratory  specimens during the acute phase of infection. The lowest concentration of SARS-CoV-2 viral copies this assay can detect is 138 copies/mL. A negative result does not preclude SARS-Cov-2 infection and should not be used as the sole basis for treatment or other patient management decisions. A negative result may occur with  improper specimen collection/handling, submission of specimen other than nasopharyngeal swab, presence of viral mutation(s) within the areas targeted by this assay, and inadequate number of viral copies(<138 copies/mL). A negative result must be combined with clinical observations, patient history, and epidemiological information. The expected result is Negative.  Fact Sheet for Patients:   EntrepreneurPulse.com.au  Fact Sheet for Healthcare Providers:  IncredibleEmployment.be  This test is no                          t yet approved or cleared by the [place] FDA and  has been authorized for detection and/or diagnosis of SARS-CoV-2 by FDA under an Emergency Use Authorization (EUA). This EUA will remain  in effect (meaning this test can be used) for the duration of the COVID-19 declaration under Section 564(b)(1) of the Act, 21 U.S.C.section 360bbb-3(b)(1), unless the authorization is terminated  or revoked sooner.       Influenza A by PCR 10/04/2021 NEGATIVE  NEGATIVE Final   Influenza B by PCR 10/04/2021 NEGATIVE  NEGATIVE Final   Comment: (NOTE) The Xpert Xpress SARS-CoV-2/FLU/RSV plus assay is intended as an aid in the diagnosis of influenza from Nasopharyngeal swab specimens and should not be used as a sole basis for treatment. Nasal washings and aspirates are unacceptable for Xpert Xpress SARS-CoV-2/FLU/RSV testing.  Fact Sheet for Patients: EntrepreneurPulse.com.au  Fact Sheet for Healthcare Providers: IncredibleEmployment.be  This test is not yet approved or cleared by the [place] FDA and has been authorized for detection and/or diagnosis of SARS-CoV-2 by FDA under an Emergency Use Authorization (EUA). This EUA will remain in effect (meaning this test can be used) for the duration of the COVID-19 declaration under Section 564(b)(1) of the Act, 21 U.S.C. section 360bbb-3(b)(1), unless the authorization is terminated or revoked.  Performed at [facility], [place] [address] [place], [place] [address]    WBC 10/04/2021 8.4  4.0 - 10.5 K/uL Final   RBC 10/04/2021 4.42  3.87 - 5.11 MIL/uL Final   Hemoglobin 10/04/2021 11.6 (L)  12.0 - 15.0 g/dL Final   HCT 10/04/2021 35.7 (L)  36.0 - 46.0 % Final   MCV 10/04/2021 80.8  80.0 - 100.0 fL Final   MCH 10/04/2021 26.2  26.0  - 34.0 pg Final   MCHC 10/04/2021 32.5  30.0 - 36.0 g/dL Final   RDW 10/04/2021 16.0 (H)  11.5 - 15.5 % Final   Platelets 10/04/2021 372  150 - 400 K/uL Final   nRBC 10/04/2021 0.0  0.0 - 0.2 % Final   Neutrophils Relative % 10/04/2021 68  % Final   Neutro Abs 10/04/2021 5.7  1.7 - 7.7 K/uL Final   Lymphocytes Relative 10/04/2021 22  % Final   Lymphs Abs 10/04/2021 1.8  0.7 - 4.0 K/uL Final   Monocytes Relative 10/04/2021 8  % Final   Monocytes Absolute 10/04/2021 0.7  0.1 - 1.0 K/uL Final   Eosinophils Relative 10/04/2021 1  % Final   Eosinophils Absolute 10/04/2021 0.1  0.0 - 0.5 K/uL Final   Basophils Relative 10/04/2021 1  % Final   Basophils Absolute 10/04/2021 0.1  0.0 - 0.1 K/uL Final  Immature Granulocytes 10/04/2021 0  % Final   Abs Immature Granulocytes 10/04/2021 0.03  0.00 - 0.07 K/uL Final   Performed at [facility] [address] [place], [place] [address]   Sodium 10/04/2021 136  135 - 145 mmol/L Final   Potassium 10/04/2021 4.2  3.5 - 5.1 mmol/L Final   Chloride 10/04/2021 104  98 - 111 mmol/L Final   CO2 10/04/2021 25  22 - 32 mmol/L Final   Glucose, Bld 10/04/2021 100 (H)  70 - 99 mg/dL Final   Glucose reference range applies only to samples taken after fasting for at least 8 hours.   BUN 10/04/2021 9  6 - 20 mg/dL Final   Creatinine, Ser 10/04/2021 0.52  0.44 - 1.00 mg/dL Final   Calcium 10/04/2021 9.0  8.9 - 10.3 mg/dL Final   Total Protein 10/04/2021 7.0  6.5 - 8.1 g/dL Final   Albumin 10/04/2021 3.2 (L)  3.5 - 5.0 g/dL Final   AST 10/04/2021 13 (L)  15 - 41 U/L Final   ALT 10/04/2021 10  0 - 44 U/L Final   Alkaline Phosphatase 10/04/2021 61  38 - 126 U/L Final   Total Bilirubin 10/04/2021 0.3  0.3 - 1.2 mg/dL Final   GFR, Estimated 10/04/2021 >60  >60 mL/min Final   Comment: (NOTE) Calculated using the CKD-EPI Creatinine Equation (2021)    Anion gap 10/04/2021 7  5 - 15 Final   Performed at [facility] [address], [place], [place]  [address]   Hgb A1c MFr Bld 10/04/2021 7.0 (H)  4.8 - 5.6 % Final   Comment: (NOTE) Pre diabetes:          5.7%-6.4%  Diabetes:              >6.4%  Glycemic control for   <7.0% adults with diabetes    Mean Plasma Glucose 10/04/2021 154.2  mg/dL Final   Performed at [facility], [place] [address], [place], [place] [address]   Cholesterol 10/04/2021 178  0 - 200 mg/dL Final   Triglycerides 10/04/2021 155 (H)  <150 mg/dL Final   HDL 10/04/2021 52  >40 mg/dL Final   Total CHOL/HDL Ratio 10/04/2021 3.4  RATIO Final   VLDL 10/04/2021 31  0 - 40 mg/dL Final   LDL Cholesterol 10/04/2021 95  0 - 99 mg/dL Final   Comment:        Total Cholesterol/HDL:CHD Risk Coronary Heart Disease Risk Table                     Men   Women  1/2 Average Risk   3.4   3.3  Average Risk       5.0   4.4  2 X Average Risk   9.6   7.1  3 X Average Risk  23.4   11.0        Use the calculated Patient Ratio above and the CHD Risk Table to determine the patient's CHD Risk.        ATP III CLASSIFICATION (LDL):  <100     mg/dL   Optimal  100-129  mg/dL   Near or Above                    Optimal  130-159  mg/dL   Borderline  160-189  mg/dL   High  >190     mg/dL   Very High Performed at [facility],  [address] [place], [place] [address]    POC Amphetamine UR 10/04/2021 None Detected  NONE DETECTED (Cut Off Level 1000 ng/mL) Final   POC Secobarbital (BAR) 10/04/2021 None Detected  NONE DETECTED (Cut Off Level 300 ng/mL) Final   POC Buprenorphine (BUP) 10/04/2021 None Detected  NONE DETECTED (Cut Off Level 10 ng/mL) Final   POC Oxazepam (BZO) 10/04/2021 None Detected  NONE DETECTED (Cut Off Level 300 ng/mL) Final   POC Cocaine UR 10/04/2021 None Detected  NONE DETECTED (Cut Off Level 300 ng/mL) Final   POC Methamphetamine UR 10/04/2021 None Detected  NONE DETECTED (Cut Off Level 1000 ng/mL) Final   POC Morphine 10/04/2021 None Detected  NONE DETECTED (Cut Off Level 300 ng/mL) Final   POC Methadone  UR 10/04/2021 None Detected  NONE DETECTED (Cut Off Level 300 ng/mL) Final   POC Oxycodone UR 10/04/2021 Positive (A)  NONE DETECTED (Cut Off Level 100 ng/mL) Final   POC Marijuana UR 10/04/2021 None Detected  NONE DETECTED (Cut Off Level 50 ng/mL) Final   SARSCOV2ONAVIRUS 2 AG 10/04/2021 NEGATIVE  NEGATIVE Final   Comment: (NOTE) SARS-CoV-2 antigen NOT DETECTED.   Negative results are presumptive.  Negative results do not preclude SARS-CoV-2 infection and should not be used as the sole basis for treatment or other patient management decisions, including infection  control decisions, particularly in the presence of clinical signs and  symptoms consistent with COVID-19, or in those who have been in contact with the virus.  Negative results must be combined with clinical observations, patient history, and epidemiological information. The expected result is Negative.  Fact Sheet for Patients: HandmadeRecipes.com.cy  Fact Sheet for Healthcare Providers: FuneralLife.at  This test is not yet approved or cleared by the [place] FDA and  has been authorized for detection and/or diagnosis of SARS-CoV-2 by FDA under an Emergency Use Authorization (EUA).  This EUA will remain in effect (meaning this test can be used) for the duration of  the COV                          ID-19 declaration under Section 564(b)(1) of the Act, 21 U.S.C. section 360bbb-3(b)(1), unless the authorization is terminated or revoked sooner.     Valproic Acid Lvl 10/04/2021 51  50.0 - 100.0 ug/mL Final   Performed at [facility] [address] [place], [place] [address]   Valproic Acid Lvl 10/08/2021 57  50.0 - 100.0 ug/mL Final   Performed at [facility] [address] [place], [place] [address]   Glucose-Capillary 10/09/2021 123 (H)  70 - 99 mg/dL Final   Glucose reference range applies only to samples taken after fasting for at least 8 hours.   There may be more visits with results that are not included.    Blood Alcohol level:  Lab Results  Component Value Date   ETH <10 11/23/2021   ETH <10 81/44/8185    Metabolic Disorder Labs: Lab Results  Component Value Date   HGBA1C 6.7 (H) 12/28/2021   MPG 145.59 12/28/2021   MPG 154.2 10/04/2021   Lab Results  Component Value Date   PROLACTIN 3.8 (L) 11/23/2021   Lab Results  Component Value Date   CHOL 171 11/23/2021   TRIG 125 11/23/2021   HDL 65 11/23/2021   CHOLHDL 2.6 11/23/2021   VLDL 25 11/23/2021   LDLCALC 81 11/23/2021   LDLCALC 95 10/04/2021    Therapeutic Lab Levels: No  results found for: "LITHIUM" Lab Results  Component Value Date   VALPROATE 17 (L) 02/17/2022   VALPROATE 33 (L) 01/18/2022   No results found for: "CBMZ"  Physical Findings   PHQ2-9    [place] ED from 11/23/2021 in [facility]  PHQ-2 Total Score 2  PHQ-9 Total Score 3      Flowsheet Row ED from 02/18/2022 in [facility] ED from 02/17/2022 in [place] ED from 02/09/2022 in [facility] No Risk No Risk No Risk        Musculoskeletal  Strength & Muscle Tone: within normal limits Gait & Station: normal Patient leans: N/A  Psychiatric Specialty Exam  Presentation  General Appearance:  Appropriate for Environment; Casual  Eye Contact: Fair  Speech: Clear and Coherent; Normal Rate  Speech Volume: Normal  Handedness: Right   Mood and Affect  Mood: Euthymic  Affect: Blunt   Thought Process  Thought Processes: Coherent; Goal Directed; Linear  Descriptions of Associations:Intact  Orientation:Full (Time, Place and Person)  Thought Content:Logical  Diagnosis of Schizophrenia or Schizoaffective disorder in past: Yes  Duration of Psychotic Symptoms: No data recorded  Hallucinations:Hallucinations:  None  Ideas of Reference:None  Suicidal Thoughts:Suicidal Thoughts: No  Homicidal Thoughts:Homicidal Thoughts: No   Sensorium  Memory: Immediate Fair  Judgment: Intact  Insight: Present   Executive Functions  Concentration: Fair  Attention Span: Fair  Recall: [place] of Knowledge: Fair  Language: Fair   Psychomotor Activity  Psychomotor Activity: Psychomotor Activity: Normal   Assets  Assets: Communication Skills; Financial Resources/Insurance; Desire for Improvement; Leisure Time; Resilience   Sleep  Sleep: Sleep: Good   No data recorded  Physical Exam  Physical Exam Constitutional:      General: She is not in acute distress.    Appearance: She is not ill-appearing, toxic-appearing or diaphoretic.  Eyes:     General: No scleral icterus. Cardiovascular:     Rate and Rhythm: Tachycardia present.  Pulmonary:     Effort: Pulmonary effort is normal. No respiratory distress.  Neurological:     Mental Status: She is alert and oriented to person, place, and time.  Psychiatric:        Attention and Perception: Attention and perception normal.        Mood and Affect: Mood normal. Affect is blunt.        Speech: Speech normal.        Behavior: Behavior normal. Behavior is cooperative.        Thought Content: Thought content normal.    Review of Systems  Constitutional:  Negative for chills and fever.  Respiratory:  Negative for shortness of breath.   Cardiovascular:  Negative for chest pain and palpitations.  Gastrointestinal:  Negative for abdominal pain.  Neurological:  Negative for headaches.   Blood pressure 115/77, pulse (!) 110, temperature (!) 97.4 F (36.3 C), temperature source Oral, resp. rate 19, SpO2 94 %. There is no height or weight on file to calculate BMI.  Treatment Plan Summary: Plan Pt remains psychiatrically cleared. Disposition is pending possible placement at [facility].  Tharon Aquas,  NP 02/23/2022 9:56 AM

## 2022-02-23 NOTE — Progress Notes (Signed)
PHARMACIST - PHYSICIAN ORDER COMMUNICATION  Teresa Murillo is a 61 y.o. year old female with a history of schizophrenia on Clozapine PTA. Continuing this medication order as an inpatient requires that monitoring parameters per REMS requirements must be met.   Clozapine REMS Dispense Authorization was obtained with RDA# [id number]  Last ANC value and date reported on the Clozapine REMS website: 02/23/22  2700. Next [place] due 03/02/22.  [name] 02/23/2022, 1:36 PM

## 2022-02-23 NOTE — ED Notes (Signed)
Patient is alert & oriented x 4, calm, cooperative and compliant with treatment. Denies SI, HI and AVH, reports throat discomfort and asks for tylenol for it. Patient observed interacting appropriately with peer. No apparent distress noted and will continue to monitor for safety.

## 2022-02-23 NOTE — ED Notes (Signed)
Pt is sleeping. No distress noted. Will continue to monitor safety. 

## 2022-02-24 DIAGNOSIS — F209 Schizophrenia, unspecified: Secondary | ICD-10-CM | POA: Diagnosis not present

## 2022-02-24 LAB — GLUCOSE, CAPILLARY
Glucose-Capillary: 207 mg/dL — ABNORMAL HIGH (ref 70–99)
Glucose-Capillary: 109 mg/dL — ABNORMAL HIGH (ref 70–99)
Glucose-Capillary: 127 mg/dL — ABNORMAL HIGH (ref 70–99)

## 2022-02-24 NOTE — ED Notes (Signed)
Pt sleeping@this  time. Breathing even and unlabored will continue to monitor or safety

## 2022-02-24 NOTE — ED Notes (Signed)
Patient resting quietly in bed with eyes closed, Respirations equal and unlabored, skin warm and dry, NAD. Routine safety checks conducted according to facility protocol. Will continue to monitor for safety. 

## 2022-02-24 NOTE — ED Notes (Signed)
Pt A&O x 4, no distress noted, watching TV at present.  Monitoring for safety.

## 2022-02-24 NOTE — Care Management CC44 (Signed)
Care Management OBS   Patient will receive a new Care Manager with [facility].  Per [place] Lank the new Care Manager will be Mason Jim [phone]).    Patient has a Treatment Team Meeting today at 12pm to discuss if the authorization for services at the Group Home was processed.  If the patient authorization has been processed then the Team will discuss the date that the patient should be able to safely discharge from the [facility] to the [facility].

## 2022-02-24 NOTE — Progress Notes (Signed)
Pt taken to courtyard for fresh air and exercise.

## 2022-02-24 NOTE — ED Provider Notes (Signed)
Behavioral Health Progress Note  Date and Time: 02/24/2022 1:17 PM Name: Teresa Murillo MRN:  [id number]  Subjective: Patient states "I wanted to get out of here but I know I have to wait through the holidays."  She is reclined in observation area upon my approach, no apparent distress.  She is alert and oriented, pleasant and cooperative during assessment.  Patient is reassessed, face-to-face, by nurse practitioner.  She presents with euthymic mood, congruent affect.  Patient attended treatment team meeting along with transition of care team earlier this date.  [facility] care manager will be updated as current care manager leaving position.  Teresa Murillo continues to deny suicidal and homicidal ideation.  She easily contracts verbally for safety with this Probation officer.  She denies auditory and visual hallucinations.  There is no evidence of delusional thought content and no indication that she is responding to internal stimuli.  She endorses average sleep and appetite. She Is compliant with medications and engaged with staff and peers.  Patient offered support and encouragement.  She verbalizes understanding of treatment plan.  02/24/2022- 0956am Teresa Murillo is a 61 y.o. female with a past psychiatric history of schizophrenia, aggressive behavior, and possible intellectual disability presenting to [facility] on 11/23/21 voluntarily as a walk in via [facility] with complaints that she was locked out of the group home. Patient has been dismissed from group home due to multiple elopements. Pt had already been discharged from this facility but had been living there temporarily while DSS was looking for new placement. Pt is boarding at [facility].   AID to Capacity Evaluation (ACE) completed by Dr. Louis Meckel, Dr. Dwyane Dee on 12/11/2021.  Please see media tab for full details.    Teresa Harman, PhD completed: Wechsler Adult Intelligence Scale-4, Teresa Murillo achieved a full-scale IQ score of 73 and a percentile rank of  4 placing her in the borderline range of intellectual functioning (12/14/2021) Please see consult note from Teresa Harman, PhD on 12/14/2021.  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 On the Wechsler Adult Intelligence Scale-4, Teresa Murillo achieved a full-scale IQ score of 73 and a percentile rank of 4 placing her in the borderline range of intellectual functioning.    Chronic obstructive pulmonary disease (COPD) (HCC)    Glaucoma    Hyperlipidemia    Hypertension    Iron deficiency    Schizoaffective disorder (HCC)    Type 2 diabetes mellitus (HCC)     Past Surgical History:  Procedure Laterality Date   TUBAL LIGATION     Family History:  Family History  Problem Relation Age of Onset   Breast cancer Maternal Grandmother    Family Psychiatric  History: see H & P Social History:  Social History   Substance and Sexual Activity  Alcohol Use Not Currently     Social History   Substance and Sexual Activity  Drug Use Not Currently    Social History   Socioeconomic History   Marital status: Divorced    Spouse name: Not on file   Number of children: Not on file   Years of education: Not on file   Highest education level: Not on file  Occupational History   Not on file  Tobacco Use   Smoking status:  Former    Packs/day: 1.00    Types: Cigars, Cigarettes    Quit date: 11/09/2021    Years since quitting: 0.2   Smokeless tobacco: Current  Vaping Use   Vaping Use: Never used  Substance and Sexual Activity   Alcohol use: Not Currently   Drug use: Not Currently   Sexual activity: Not Currently    Birth control/protection: Surgical  Other Topics Concern   Not on file  Social History  Narrative   Not on file   Social Determinants of Health   Financial Resource Strain: Not on file  Food Insecurity: Not on file  Transportation Needs: Not on file  Physical Activity: Not on file  Stress: Not on file  Social Connections: Not on file   SDOH:  SDOH Screenings   Depression (PHQ2-9): Low Risk  (11/23/2021)  Tobacco Use: High Risk (02/17/2022)   Additional Social History:                         Sleep: Good  Appetite:  Good  Current Medications:  Current Facility-Administered Medications  Medication Dose Route Frequency Provider Last Rate Last Admin   acetaminophen (TYLENOL) tablet 650 mg  650 mg Oral Q6H PRN [name], NP   650 mg at 02/23/22 2125   albuterol (VENTOLIN HFA) 108 (90 Base) MCG/ACT inhaler 2 puff  2 puff Inhalation Q6H PRN [name], NP   2 puff at 02/20/22 1944   alum & mag hydroxide-simeth (MAALOX/MYLANTA) 200-200-20 MG/5ML suspension 30 mL  30 mL Oral Q4H PRN [name], NP   30 mL at 02/21/22 2051   apixaban (ELIQUIS) tablet 5 mg  5 mg Oral BID [name], NP   5 mg at 02/24/22 0802   ARIPiprazole (ABILIFY) tablet 10 mg  10 mg Oral Daily [name], NP   10 mg at 02/24/22 0802   cloZAPine (CLOZARIL) tablet 75 mg  75 mg Oral BID France Ravens, MD   75 mg at 02/24/22 0802   colchicine tablet 0.6 mg  0.6 mg Oral QHS [name], NP   0.6 mg at 02/23/22 2126   diltiazem (CARDIZEM CD) 24 hr capsule 240 mg  240 mg Oral Daily [name], NP   240 mg at 02/24/22 0802   divalproex (DEPAKOTE ER) 24 hr tablet 500 mg  500 mg Oral BID [name], NP   500 mg at 02/24/22 0802   haloperidol (HALDOL) tablet 10 mg  10 mg Oral TID PRN [name], NP       insulin aspart (novoLOG) injection 0-9 Units  0-9 Units Subcutaneous TID WC France Ravens, MD   3 Units at 02/24/22 0801   insulin glargine-yfgn (SEMGLEE) injection 12 Units  12 Units Subcutaneous Daily [name], NP   12 Units at 02/24/22 0924   latanoprost (XALATAN) 0.005 %  ophthalmic solution 1 drop  1 drop Both Eyes QHS [name], NP   1 drop at 02/23/22 2124   magnesium hydroxide (MILK OF MAGNESIA) suspension 30 mL  30 mL Oral Daily PRN [name], NP       menthol-cetylpyridinium (CEPACOL) lozenge 3 mg  1 lozenge Oral Q4H PRN Revonda Humphrey, NP   3 mg at 02/24/22 0001   metFORMIN (GLUCOPHAGE) tablet 500 mg  500 mg Oral BID WC [name], NP   500 mg at 02/24/22 0802   mometasone-formoterol (DULERA) 200-5 MCG/ACT inhaler 2 puff  2 puff Inhalation BID [name], NP  2 puff at 02/24/22 0801   nicotine (NICODERM CQ - dosed in mg/24 hr) patch 7 mg  7 mg Transdermal Daily [name], NP   7 mg at 02/24/22 0801   valbenazine (INGREZZA) capsule 40 mg  40 mg Oral q AM [name], NP   40 mg at 02/24/22 3419   Vitamin D (Ergocalciferol) (DRISDOL) 1.25 MG (50000 UNIT) capsule 50,000 Units  50,000 Units Oral Q Leonie Green, NP   50,000 Units at 02/18/22 3790   Current Outpatient Medications  Medication Sig Dispense Refill   Accu-Chek Softclix Lancets lancets Use as directed up to four times daily 100 each 0   apixaban (ELIQUIS) 5 MG TABS tablet Take 1 tablet (5 mg total) by mouth 2 (two) times daily. 60 tablet 0   ARIPiprazole (ABILIFY) 10 MG tablet Take 1 tablet (10 mg total) by mouth daily. 30 tablet 0   Blood Glucose Monitoring Suppl (ACCU-CHEK GUIDE) w/Device KIT Use as directed up to four times daily 1 kit 0   budesonide-formoterol (SYMBICORT) 160-4.5 MCG/ACT inhaler Inhale 2 puffs into the lungs in the morning and at bedtime.     Cholecalciferol (VITAMIN D3) 1.25 MG (50000 UT) CAPS Take 50,000 Units by mouth every Thursday.     clozapine (CLOZARIL) 50 MG tablet Take 1 tablet (50 mg total) by mouth daily. 30 tablet 0   colchicine 0.6 MG tablet Take 1 tablet (0.6 mg total) by mouth at bedtime. 30 tablet 0   diltiazem (CARDIZEM CD) 240 MG 24 hr capsule Take 1 capsule (240 mg total) by mouth daily. (Patient not taking: Reported on 11/24/2021)  30 capsule 0   divalproex (DEPAKOTE ER) 500 MG 24 hr tablet Take 1 tablet (500 mg total) by mouth 2 (two) times daily. 60 tablet 0   glucose blood test strip Use as directed up to four times daily 50 each 0   haloperidol (HALDOL) 10 MG tablet Take 1 tablet (10 mg total) by mouth 3 (three) times daily as needed for agitation (and psychotic symptoms).     INGREZZA 40 MG capsule Take 1 capsule (40 mg total) by mouth in the morning. 30 capsule 0   insulin aspart (NOVOLOG) 100 UNIT/ML FlexPen Before each meal 3 times a day, 140-199 - 2 units, 200-250 - 4 units, 251-299 - 6 units,  300-349 - 8 units,  350 or above 10 units. Insulin PEN if approved, provide syringes and needles if needed.Please switch to any approved short acting Insulin if needed. 15 mL 0   insulin glargine (LANTUS) 100 UNIT/ML Solostar Pen Inject 12 Units into the skin daily. 15 mL 0   Insulin Pen Needle 32G X 4 MM MISC Use 4 times a day with insulin, 1 month supply. 100 each 0   latanoprost (XALATAN) 0.005 % ophthalmic solution Place 1 drop into both eyes at bedtime.     metFORMIN (GLUCOPHAGE) 500 MG tablet Take 500 mg by mouth 2 (two) times daily with a meal.     nicotine (NICODERM CQ - DOSED IN MG/24 HR) 7 mg/24hr patch Place 1 patch (7 mg total) onto the skin daily. 28 patch 0   PROAIR HFA 108 (90 Base) MCG/ACT inhaler Inhale 2 puffs into the lungs every 6 (six) hours as needed for wheezing or shortness of breath.      Labs  Lab Results:  Admission on 02/18/2022  Component Date Value Ref Range Status   Glucose-Capillary 02/18/2022 254 (H)  70 - 99 mg/dL Final  Glucose reference range applies only to samples taken after fasting for at least 8 hours.   Glucose-Capillary 02/18/2022 112 (H)  70 - 99 mg/dL Final   Glucose reference range applies only to samples taken after fasting for at least 8 hours.   Glucose-Capillary 02/19/2022 159 (H)  70 - 99 mg/dL Final   Glucose reference range applies only to samples taken after fasting  for at least 8 hours.   Glucose-Capillary 02/19/2022 160 (H)  70 - 99 mg/dL Final   Glucose reference range applies only to samples taken after fasting for at least 8 hours.   Glucose-Capillary 02/19/2022 177 (H)  70 - 99 mg/dL Final   Glucose reference range applies only to samples taken after fasting for at least 8 hours.   Glucose-Capillary 02/19/2022 147 (H)  70 - 99 mg/dL Final   Glucose reference range applies only to samples taken after fasting for at least 8 hours.   Glucose-Capillary 02/20/2022 126 (H)  70 - 99 mg/dL Final   Glucose reference range applies only to samples taken after fasting for at least 8 hours.   Glucose-Capillary 02/20/2022 132 (H)  70 - 99 mg/dL Final   Glucose reference range applies only to samples taken after fasting for at least 8 hours.   Glucose-Capillary 02/20/2022 125 (H)  70 - 99 mg/dL Final   Glucose reference range applies only to samples taken after fasting for at least 8 hours.   Glucose-Capillary 02/21/2022 151 (H)  70 - 99 mg/dL Final   Glucose reference range applies only to samples taken after fasting for at least 8 hours.   Glucose-Capillary 02/21/2022 106 (H)  70 - 99 mg/dL Final   Glucose reference range applies only to samples taken after fasting for at least 8 hours.   Glucose-Capillary 02/21/2022 149 (H)  70 - 99 mg/dL Final   Glucose reference range applies only to samples taken after fasting for at least 8 hours.   Glucose-Capillary 02/22/2022 131 (H)  70 - 99 mg/dL Final   Glucose reference range applies only to samples taken after fasting for at least 8 hours.   Glucose-Capillary 02/22/2022 141 (H)  70 - 99 mg/dL Final   Glucose reference range applies only to samples taken after fasting for at least 8 hours.   Glucose-Capillary 02/22/2022 183 (H)  70 - 99 mg/dL Final   Glucose reference range applies only to samples taken after fasting for at least 8 hours.   Glucose-Capillary 02/23/2022 133 (H)  70 - 99 mg/dL Final   Glucose  reference range applies only to samples taken after fasting for at least 8 hours.   WBC 02/23/2022 4.8  4.0 - 10.5 K/uL Final   RBC 02/23/2022 3.96  3.87 - 5.11 MIL/uL Final   Hemoglobin 02/23/2022 9.7 (L)  12.0 - 15.0 g/dL Final   HCT 02/23/2022 30.6 (L)  36.0 - 46.0 % Final   MCV 02/23/2022 77.3 (L)  80.0 - 100.0 fL Final   MCH 02/23/2022 24.5 (L)  26.0 - 34.0 pg Final   MCHC 02/23/2022 31.7  30.0 - 36.0 g/dL Final   RDW 02/23/2022 16.5 (H)  11.5 - 15.5 % Final   Platelets 02/23/2022 372  150 - 400 K/uL Final   nRBC 02/23/2022 0.0  0.0 - 0.2 % Final   Neutrophils Relative % 02/23/2022 56  % Final   Neutro Abs 02/23/2022 2.7  1.7 - 7.7 K/uL Final   Lymphocytes Relative 02/23/2022 32  % Final  Lymphs Abs 02/23/2022 1.5  0.7 - 4.0 K/uL Final   Monocytes Relative 02/23/2022 9  % Final   Monocytes Absolute 02/23/2022 0.4  0.1 - 1.0 K/uL Final   Eosinophils Relative 02/23/2022 2  % Final   Eosinophils Absolute 02/23/2022 0.1  0.0 - 0.5 K/uL Final   Basophils Relative 02/23/2022 1  % Final   Basophils Absolute 02/23/2022 0.1  0.0 - 0.1 K/uL Final   Immature Granulocytes 02/23/2022 0  % Final   Abs Immature Granulocytes 02/23/2022 0.01  0.00 - 0.07 K/uL Final   Performed at [facility], [place] [address], [place], [place] [address]   Glucose-Capillary 02/23/2022 202 (H)  70 - 99 mg/dL Final   Glucose reference range applies only to samples taken after fasting for at least 8 hours.   Glucose-Capillary 02/23/2022 143 (H)  70 - 99 mg/dL Final   Glucose reference range applies only to samples taken after fasting for at least 8 hours.   Glucose-Capillary 02/23/2022 143 (H)  70 - 99 mg/dL Final   Glucose reference range applies only to samples taken after fasting for at least 8 hours.   Glucose-Capillary 02/24/2022 207 (H)  70 - 99 mg/dL Final   Glucose reference range applies only to samples taken after fasting for at least 8 hours.   Glucose-Capillary 02/24/2022 109 (H)  70 - 99 mg/dL  Final   Glucose reference range applies only to samples taken after fasting for at least 8 hours.  Admission on 02/17/2022, Discharged on 02/18/2022  Component Date Value Ref Range Status   Sodium 02/17/2022 136  135 - 145 mmol/L Final   Potassium 02/17/2022 3.9  3.5 - 5.1 mmol/L Final   Chloride 02/17/2022 101  98 - 111 mmol/L Final   CO2 02/17/2022 25  22 - 32 mmol/L Final   Glucose, Bld 02/17/2022 113 (H)  70 - 99 mg/dL Final   Glucose reference range applies only to samples taken after fasting for at least 8 hours.   BUN 02/17/2022 9  6 - 20 mg/dL Final   Creatinine, Ser 02/17/2022 0.50  0.44 - 1.00 mg/dL Final   Calcium 02/17/2022 8.9  8.9 - 10.3 mg/dL Final   GFR, Estimated 02/17/2022 >60  >60 mL/min Final   Comment: (NOTE) Calculated using the CKD-EPI Creatinine Equation (2021)    Anion gap 02/17/2022 10  5 - 15 Final   Performed at [facility], [place] [address], [place], [place] [address]   WBC 02/17/2022 6.5  4.0 - 10.5 K/uL Final   RBC 02/17/2022 4.24  3.87 - 5.11 MIL/uL Final   Hemoglobin 02/17/2022 10.2 (L)  12.0 - 15.0 g/dL Final   HCT 02/17/2022 33.2 (L)  36.0 - 46.0 % Final   MCV 02/17/2022 78.3 (L)  80.0 - 100.0 fL Final   MCH 02/17/2022 24.1 (L)  26.0 - 34.0 pg Final   MCHC 02/17/2022 30.7  30.0 - 36.0 g/dL Final   RDW 02/17/2022 17.2 (H)  11.5 - 15.5 % Final   Platelets 02/17/2022 390  150 - 400 K/uL Final   nRBC 02/17/2022 0.0  0.0 - 0.2 % Final   Performed at [facility], [place] [address], [place], [place] [address]   Troponin I (High Sensitivity) 02/17/2022 4  <18 ng/L Final   Comment: (NOTE) Elevated high sensitivity troponin I (hsTnI) values and significant  changes across serial measurements may suggest ACS but many other  chronic and acute conditions are known to  elevate hsTnI results.  Refer to the "Links" section for chest pain algorithms and additional  guidance. Performed at [facility], [place] [address], [place], [place] [address]     Valproic Acid Lvl 02/17/2022 17 (L)  50.0 - 100.0 ug/mL Final   Performed at [place] [address] [place], [place] [address]   B Natriuretic Peptide 02/17/2022 30.9  0.0 - 100.0 pg/mL Final   Performed at [place] [address] [place], [place] [address]   Troponin I (High Sensitivity) 02/18/2022 4  <18 ng/L Final   Comment: (NOTE) Elevated high sensitivity troponin I (hsTnI) values and significant  changes across serial measurements may suggest ACS but many other  chronic and acute conditions are known to elevate hsTnI results.  Refer to the "Links" section for chest pain algorithms and additional  guidance. Performed at [facility], [place] [address] [place], [place] [address]    D-Dimer, Quant 02/18/2022 0.29  0.00 - 0.50 ug/mL-FEU Final   Comment: (NOTE) At the manufacturer cut-off value of 0.5 g/mL FEU, this assay has a negative predictive value of 95-100%.This assay is intended for use in conjunction with a clinical pretest probability (PTP) assessment model to exclude pulmonary embolism (PE) and deep venous thrombosis (DVT) in outpatients suspected of PE or DVT. Results should be correlated with clinical presentation. Performed at [facility], [place] [address], [place], [place] [address]   Admission on 02/09/2022, Discharged on 02/17/2022  Component Date Value Ref Range Status   Glucose-Capillary 02/09/2022 118 (H)  70 - 99 mg/dL Final   Glucose reference range applies only to samples taken after fasting for at least 8 hours.   Glucose-Capillary 02/10/2022 148 (H)  70 - 99 mg/dL Final   Glucose reference range applies only to samples taken after fasting for at least 8 hours.   Glucose-Capillary 02/10/2022 174 (H)  70 - 99 mg/dL Final   Glucose reference range applies only to samples taken after fasting for at least 8 hours.   Glucose-Capillary 02/10/2022 128 (H)  70 - 99 mg/dL Final   Glucose reference range applies only to samples  taken after fasting for at least 8 hours.   Glucose-Capillary 02/10/2022 182 (H)  70 - 99 mg/dL Final   Glucose reference range applies only to samples taken after fasting for at least 8 hours.   Glucose-Capillary 02/11/2022 158 (H)  70 - 99 mg/dL Final   Glucose reference range applies only to samples taken after fasting for at least 8 hours.   Glucose-Capillary 02/11/2022 159 (H)  70 - 99 mg/dL Final   Glucose reference range applies only to samples taken after fasting for at least 8 hours.   Glucose-Capillary 02/11/2022 153 (H)  70 - 99 mg/dL Final   Glucose reference range applies only to samples taken after fasting for at least 8 hours.   Glucose-Capillary 02/11/2022 159 (H)  70 - 99 mg/dL Final   Glucose reference range applies only to samples taken after fasting for at least 8 hours.   Glucose-Capillary 02/11/2022 153 (H)  70 - 99 mg/dL Final   Glucose reference range applies only to samples taken after fasting for at least 8 hours.   Glucose-Capillary 02/11/2022 109 (H)  70 - 99 mg/dL Final   Glucose reference range applies only to samples taken after fasting for at least 8 hours.   Glucose-Capillary 02/11/2022 213 (H)  70 - 99 mg/dL Final   Glucose  reference range applies only to samples taken after fasting for at least 8 hours.   Glucose-Capillary 02/11/2022 229 (H)  70 - 99 mg/dL Final   Glucose reference range applies only to samples taken after fasting for at least 8 hours.   Glucose-Capillary 02/12/2022 128 (H)  70 - 99 mg/dL Final   Glucose reference range applies only to samples taken after fasting for at least 8 hours.   Glucose-Capillary 02/12/2022 149 (H)  70 - 99 mg/dL Final   Glucose reference range applies only to samples taken after fasting for at least 8 hours.   Color, Urine 02/12/2022 YELLOW  YELLOW Final   APPearance 02/12/2022 CLEAR  CLEAR Final   Specific Gravity, Urine 02/12/2022 1.015  1.005 - 1.030 Final   pH 02/12/2022 5.0  5.0 - 8.0 Final   Glucose, UA  02/12/2022 NEGATIVE  NEGATIVE mg/dL Final   Hgb urine dipstick 02/12/2022 NEGATIVE  NEGATIVE Final   Bilirubin Urine 02/12/2022 NEGATIVE  NEGATIVE Final   Ketones, ur 02/12/2022 NEGATIVE  NEGATIVE mg/dL Final   Protein, ur 02/12/2022 NEGATIVE  NEGATIVE mg/dL Final   Nitrite 02/12/2022 NEGATIVE  NEGATIVE Final   Leukocytes,Ua 02/12/2022 NEGATIVE  NEGATIVE Final   Performed at [facility], [place] [address], [place], [place] [address]   Specimen Description 02/12/2022 URINE, CLEAN CATCH   Final   Special Requests 02/12/2022    Final                   Value:NONE Performed at [facility], [place] [address] [place], [place] [address]    Culture 02/12/2022 10,000 COLONIES/mL PROTEUS MIRABILIS (A)   Final   Report Status 02/12/2022 02/15/2022 FINAL   Final   Organism ID, Bacteria 02/12/2022 PROTEUS MIRABILIS (A)   Final   Glucose-Capillary 02/12/2022 128 (H)  70 - 99 mg/dL Final   Glucose reference range applies only to samples taken after fasting for at least 8 hours.   Glucose-Capillary 02/12/2022 196 (H)  70 - 99 mg/dL Final   Glucose reference range applies only to samples taken after fasting for at least 8 hours.   Glucose-Capillary 02/13/2022 168 (H)  70 - 99 mg/dL Final   Glucose reference range applies only to samples taken after fasting for at least 8 hours.   Glucose-Capillary 02/13/2022 153 (H)  70 - 99 mg/dL Final   Glucose reference range applies only to samples taken after fasting for at least 8 hours.   Glucose-Capillary 02/13/2022 134 (H)  70 - 99 mg/dL Final   Glucose reference range applies only to samples taken after fasting for at least 8 hours.   Glucose-Capillary 02/13/2022 178 (H)  70 - 99 mg/dL Final   Glucose reference range applies only to samples taken after fasting for at least 8 hours.   Glucose-Capillary 02/14/2022 156 (H)  70 - 99 mg/dL Final   Glucose reference range applies only to samples taken after fasting for at least 8 hours.   Glucose-Capillary  02/14/2022 169 (H)  70 - 99 mg/dL Final   Glucose reference range applies only to samples taken after fasting for at least 8 hours.   Glucose-Capillary 02/14/2022 156 (H)  70 - 99 mg/dL Final   Glucose reference range applies only to samples taken after fasting for at least 8 hours.   Glucose-Capillary 02/14/2022 148 (H)  70 - 99 mg/dL Final   Glucose reference range applies only to samples taken after fasting for at least 8 hours.  Glucose-Capillary 02/15/2022 159 (H)  70 - 99 mg/dL Final   Glucose reference range applies only to samples taken after fasting for at least 8 hours.   Glucose-Capillary 02/15/2022 125 (H)  70 - 99 mg/dL Final   Glucose reference range applies only to samples taken after fasting for at least 8 hours.   Glucose-Capillary 02/15/2022 142 (H)  70 - 99 mg/dL Final   Glucose reference range applies only to samples taken after fasting for at least 8 hours.   Glucose-Capillary 02/15/2022 128 (H)  70 - 99 mg/dL Final   Glucose reference range applies only to samples taken after fasting for at least 8 hours.   Glucose-Capillary 02/16/2022 137 (H)  70 - 99 mg/dL Final   Glucose reference range applies only to samples taken after fasting for at least 8 hours.   WBC 02/16/2022 6.0  4.0 - 10.5 K/uL Final   RBC 02/16/2022 4.48  3.87 - 5.11 MIL/uL Final   Hemoglobin 02/16/2022 10.7 (L)  12.0 - 15.0 g/dL Final   HCT 02/16/2022 35.4 (L)  36.0 - 46.0 % Final   MCV 02/16/2022 79.0 (L)  80.0 - 100.0 fL Final   MCH 02/16/2022 23.9 (L)  26.0 - 34.0 pg Final   MCHC 02/16/2022 30.2  30.0 - 36.0 g/dL Final   RDW 02/16/2022 17.0 (H)  11.5 - 15.5 % Final   Platelets 02/16/2022 387  150 - 400 K/uL Final   nRBC 02/16/2022 0.0  0.0 - 0.2 % Final   Neutrophils Relative % 02/16/2022 54  % Final   Neutro Abs 02/16/2022 3.2  1.7 - 7.7 K/uL Final   Lymphocytes Relative 02/16/2022 35  % Final   Lymphs Abs 02/16/2022 2.1  0.7 - 4.0 K/uL Final   Monocytes Relative 02/16/2022 6  % Final    Monocytes Absolute 02/16/2022 0.4  0.1 - 1.0 K/uL Final   Eosinophils Relative 02/16/2022 2  % Final   Eosinophils Absolute 02/16/2022 0.1  0.0 - 0.5 K/uL Final   Basophils Relative 02/16/2022 2  % Final   Basophils Absolute 02/16/2022 0.1  0.0 - 0.1 K/uL Final   Immature Granulocytes 02/16/2022 1  % Final   Abs Immature Granulocytes 02/16/2022 0.04  0.00 - 0.07 K/uL Final   Performed at [facility], [place] [address] [place], [place] [address]   Glucose-Capillary 02/16/2022 131 (H)  70 - 99 mg/dL Final   Glucose reference range applies only to samples taken after fasting for at least 8 hours.   Glucose-Capillary 02/16/2022 123 (H)  70 - 99 mg/dL Final   Glucose reference range applies only to samples taken after fasting for at least 8 hours.   Glucose-Capillary 02/16/2022 195 (H)  70 - 99 mg/dL Final   Glucose reference range applies only to samples taken after fasting for at least 8 hours.   Glucose-Capillary 02/17/2022 148 (H)  70 - 99 mg/dL Final   Glucose reference range applies only to samples taken after fasting for at least 8 hours.  Admission on 02/08/2022, Discharged on 02/09/2022  Component Date Value Ref Range Status   B Natriuretic Peptide 02/08/2022 40.2  0.0 - 100.0 pg/mL Final   Performed at [facility], [address], [place], [place] [address]   Troponin I (High Sensitivity) 02/08/2022 4  <18 ng/L Final   Comment: (NOTE) Elevated high sensitivity troponin I (hsTnI) values and significant  changes across serial measurements may suggest ACS but many other  chronic and acute  conditions are known to elevate hsTnI results.  Refer to the "Links" section for chest pain algorithms and additional  guidance. Performed at [facility], [place] [address], [place], [place] [address]    WBC 02/08/2022 8.5  4.0 - 10.5 K/uL Final   RBC 02/08/2022 4.30  3.87 - 5.11 MIL/uL Final   Hemoglobin 02/08/2022 10.7 (L)  12.0 - 15.0 g/dL Final   HCT 02/08/2022 33.1 (L)  36.0 -  46.0 % Final   MCV 02/08/2022 77.0 (L)  80.0 - 100.0 fL Final   MCH 02/08/2022 24.9 (L)  26.0 - 34.0 pg Final   MCHC 02/08/2022 32.3  30.0 - 36.0 g/dL Final   RDW 02/08/2022 16.5 (H)  11.5 - 15.5 % Final   Platelets 02/08/2022 361  150 - 400 K/uL Final   nRBC 02/08/2022 0.0  0.0 - 0.2 % Final   Performed at [facility] [address] [place], [place] [address]   Sodium 02/08/2022 131 (L)  135 - 145 mmol/L Final   Potassium 02/08/2022 4.4  3.5 - 5.1 mmol/L Final   Chloride 02/08/2022 96 (L)  98 - 111 mmol/L Final   CO2 02/08/2022 25  22 - 32 mmol/L Final   Glucose, Bld 02/08/2022 126 (H)  70 - 99 mg/dL Final   Glucose reference range applies only to samples taken after fasting for at least 8 hours.   BUN 02/08/2022 11  6 - 20 mg/dL Final   Creatinine, Ser 02/08/2022 0.52  0.44 - 1.00 mg/dL Final   Calcium 02/08/2022 9.4  8.9 - 10.3 mg/dL Final   Total Protein 02/08/2022 7.5  6.5 - 8.1 g/dL Final   Albumin 02/08/2022 3.6  3.5 - 5.0 g/dL Final   AST 02/08/2022 22  15 - 41 U/L Final   ALT 02/08/2022 20  0 - 44 U/L Final   Alkaline Phosphatase 02/08/2022 67  38 - 126 U/L Final   Total Bilirubin 02/08/2022 0.1 (L)  0.3 - 1.2 mg/dL Final   GFR, Estimated 02/08/2022 >60  >60 mL/min Final   Comment: (NOTE) Calculated using the CKD-EPI Creatinine Equation (2021)    Anion gap 02/08/2022 10  5 - 15 Final   Performed at [facility] [address] [place], [place] [address]   Troponin I (High Sensitivity) 02/08/2022 5  <18 ng/L Final   Comment: (NOTE) Elevated high sensitivity troponin I (hsTnI) values and significant  changes across serial measurements may suggest ACS but many other  chronic and acute conditions are known to elevate hsTnI results.  Refer to the "Links" section for chest pain algorithms and additional  guidance. Performed at [facility], [place] [address] [place], [place] [address]    WBC 02/09/2022 7.3  4.0 - 10.5 K/uL Final   RBC 02/09/2022 4.70   3.87 - 5.11 MIL/uL Final   Hemoglobin 02/09/2022 11.4 (L)  12.0 - 15.0 g/dL Final   HCT 02/09/2022 36.6  36.0 - 46.0 % Final   MCV 02/09/2022 77.9 (L)  80.0 - 100.0 fL Final   MCH 02/09/2022 24.3 (L)  26.0 - 34.0 pg Final   MCHC 02/09/2022 31.1  30.0 - 36.0 g/dL Final   RDW 02/09/2022 16.6 (H)  11.5 - 15.5 % Final   Platelets 02/09/2022 403 (H)  150 - 400 K/uL Final   nRBC 02/09/2022 0.0  0.0 - 0.2 % Final   Performed at [facility] [address] [place], [place] [address]  Glucose-Capillary 02/08/2022 117 (H)  70 - 99 mg/dL Final   Glucose reference range applies only to samples taken after fasting for at least 8 hours.   Sodium 02/09/2022 138  135 - 145 mmol/L Final   Potassium 02/09/2022 3.7  3.5 - 5.1 mmol/L Final   Chloride 02/09/2022 103  98 - 111 mmol/L Final   CO2 02/09/2022 24  22 - 32 mmol/L Final   Glucose, Bld 02/09/2022 134 (H)  70 - 99 mg/dL Final   Glucose reference range applies only to samples taken after fasting for at least 8 hours.   BUN 02/09/2022 9  6 - 20 mg/dL Final   Creatinine, Ser 02/09/2022 0.44  0.44 - 1.00 mg/dL Final   Calcium 02/09/2022 8.3 (L)  8.9 - 10.3 mg/dL Final   GFR, Estimated 02/09/2022 >60  >60 mL/min Final   Comment: (NOTE) Calculated using the CKD-EPI Creatinine Equation (2021)    Anion gap 02/09/2022 11  5 - 15 Final   Performed at [facility], [place] [address] [place], [place] [address]   Iron 02/09/2022 20 (L)  28 - 170 ug/dL Final   TIBC 02/09/2022 455 (H)  250 - 450 ug/dL Final   Saturation Ratios 02/09/2022 4 (L)  10.4 - 31.8 % Final   UIBC 02/09/2022 435  ug/dL Final   Performed at [facility], [place] [address] [place], [place] [address]   Ferritin 02/09/2022 2 (L)  11 - 307 ng/mL Final   Performed at [facility] [address], [place], [place] [address]   Weight 02/09/2022 2,846.58  oz Final   Height 02/09/2022 62  in Final   BP 02/09/2022 143/80  mmHg Final   WBC 02/09/2022 8.1  4.0 - 10.5 K/uL  Final   RBC 02/09/2022 4.38  3.87 - 5.11 MIL/uL Final   Hemoglobin 02/09/2022 10.9 (L)  12.0 - 15.0 g/dL Final   HCT 02/09/2022 33.3 (L)  36.0 - 46.0 % Final   MCV 02/09/2022 76.0 (L)  80.0 - 100.0 fL Final   MCH 02/09/2022 24.9 (L)  26.0 - 34.0 pg Final   MCHC 02/09/2022 32.7  30.0 - 36.0 g/dL Final   RDW 02/09/2022 16.5 (H)  11.5 - 15.5 % Final   Platelets 02/09/2022 397  150 - 400 K/uL Final   nRBC 02/09/2022 0.0  0.0 - 0.2 % Final   Neutrophils Relative % 02/09/2022 66  % Final   Neutro Abs 02/09/2022 5.4  1.7 - 7.7 K/uL Final   Lymphocytes Relative 02/09/2022 24  % Final   Lymphs Abs 02/09/2022 1.9  0.7 - 4.0 K/uL Final   Monocytes Relative 02/09/2022 8  % Final   Monocytes Absolute 02/09/2022 0.6  0.1 - 1.0 K/uL Final   Eosinophils Relative 02/09/2022 1  % Final   Eosinophils Absolute 02/09/2022 0.1  0.0 - 0.5 K/uL Final   Basophils Relative 02/09/2022 1  % Final   Basophils Absolute 02/09/2022 0.1  0.0 - 0.1 K/uL Final   Immature Granulocytes 02/09/2022 0  % Final   Abs Immature Granulocytes 02/09/2022 0.03  0.00 - 0.07 K/uL Final   Performed at [facility], [place] [address] [place], [place] [address]   Glucose-Capillary 02/09/2022 238 (H)  70 - 99 mg/dL Final   Glucose reference range applies only to samples taken after fasting for at least 8 hours.   Glucose-Capillary 02/09/2022 91  70 - 99 mg/dL Final   Glucose reference range applies  only to samples taken after fasting for at least 8 hours.   CRP 02/09/2022 <0.5  <1.0 mg/dL Final   Performed at [facility] [address] [place], [place] [address]   Sed Rate 02/09/2022 15  0 - 22 mm/hr Final   Performed at [facility] [address] [place], [place] [address]   Glucose-Capillary 02/09/2022 137 (H)  70 - 99 mg/dL Final   Glucose reference range applies only to samples taken after fasting for at least 8 hours.  Admission on 01/24/2022, Discharged on 02/08/2022  Component Date Value Ref Range Status    Glucose-Capillary 01/24/2022 143 (H)  70 - 99 mg/dL Final   Glucose reference range applies only to samples taken after fasting for at least 8 hours.   Glucose-Capillary 01/24/2022 120 (H)  70 - 99 mg/dL Final   Glucose reference range applies only to samples taken after fasting for at least 8 hours.   Glucose-Capillary 01/25/2022 122 (H)  70 - 99 mg/dL Final   Glucose reference range applies only to samples taken after fasting for at least 8 hours.   Glucose-Capillary 01/25/2022 190 (H)  70 - 99 mg/dL Final   Glucose reference range applies only to samples taken after fasting for at least 8 hours.   Glucose-Capillary 01/25/2022 163 (H)  70 - 99 mg/dL Final   Glucose reference range applies only to samples taken after fasting for at least 8 hours.   WBC 01/26/2022 6.5  4.0 - 10.5 K/uL Final   RBC 01/26/2022 4.53  3.87 - 5.11 MIL/uL Final   Hemoglobin 01/26/2022 11.3 (L)  12.0 - 15.0 g/dL Final   HCT 01/26/2022 36.4  36.0 - 46.0 % Final   MCV 01/26/2022 80.4  80.0 - 100.0 fL Final   MCH 01/26/2022 24.9 (L)  26.0 - 34.0 pg Final   MCHC 01/26/2022 31.0  30.0 - 36.0 g/dL Final   RDW 01/26/2022 17.3 (H)  11.5 - 15.5 % Final   Platelets 01/26/2022 327  150 - 400 K/uL Final   nRBC 01/26/2022 0.0  0.0 - 0.2 % Final   Neutrophils Relative % 01/26/2022 60  % Final   Neutro Abs 01/26/2022 3.9  1.7 - 7.7 K/uL Final   Lymphocytes Relative 01/26/2022 31  % Final   Lymphs Abs 01/26/2022 2.0  0.7 - 4.0 K/uL Final   Monocytes Relative 01/26/2022 7  % Final   Monocytes Absolute 01/26/2022 0.5  0.1 - 1.0 K/uL Final   Eosinophils Relative 01/26/2022 1  % Final   Eosinophils Absolute 01/26/2022 0.1  0.0 - 0.5 K/uL Final   Basophils Relative 01/26/2022 1  % Final   Basophils Absolute 01/26/2022 0.1  0.0 - 0.1 K/uL Final   Immature Granulocytes 01/26/2022 0  % Final   Abs Immature Granulocytes 01/26/2022 0.02  0.00 - 0.07 K/uL Final   Performed at [facility], [place] [address] [place], [place]  [address]   Glucose-Capillary 01/26/2022 149 (H)  70 - 99 mg/dL Final   Glucose reference range applies only to samples taken after fasting for at least 8 hours.   Glucose-Capillary 01/26/2022 130 (H)  70 - 99 mg/dL Final   Glucose reference range applies only to samples taken after fasting for at least 8 hours.   Glucose-Capillary 01/26/2022 151 (H)  70 - 99 mg/dL Final   Glucose reference range applies only to samples taken after fasting for at least 8 hours.   Glucose-Capillary 01/26/2022 170 (  H)  70 - 99 mg/dL Final   Glucose reference range applies only to samples taken after fasting for at least 8 hours.   Glucose-Capillary 01/27/2022 129 (H)  70 - 99 mg/dL Final   Glucose reference range applies only to samples taken after fasting for at least 8 hours.   Glucose-Capillary 01/27/2022 101 (H)  70 - 99 mg/dL Final   Glucose reference range applies only to samples taken after fasting for at least 8 hours.   Glucose-Capillary 01/27/2022 220 (H)  70 - 99 mg/dL Final   Glucose reference range applies only to samples taken after fasting for at least 8 hours.   Glucose-Capillary 01/27/2022 154 (H)  70 - 99 mg/dL Final   Glucose reference range applies only to samples taken after fasting for at least 8 hours.   Glucose-Capillary 01/27/2022 163 (H)  70 - 99 mg/dL Final   Glucose reference range applies only to samples taken after fasting for at least 8 hours.   Glucose-Capillary 01/28/2022 127 (H)  70 - 99 mg/dL Final   Glucose reference range applies only to samples taken after fasting for at least 8 hours.   Glucose-Capillary 01/28/2022 110 (H)  70 - 99 mg/dL Final   Glucose reference range applies only to samples taken after fasting for at least 8 hours.   Glucose-Capillary 01/28/2022 167 (H)  70 - 99 mg/dL Final   Glucose reference range applies only to samples taken after fasting for at least 8 hours.   Glucose-Capillary 01/29/2022 145 (H)  70 - 99 mg/dL Final   Glucose reference range applies  only to samples taken after fasting for at least 8 hours.   Glucose-Capillary 01/29/2022 203 (H)  70 - 99 mg/dL Final   Glucose reference range applies only to samples taken after fasting for at least 8 hours.   Glucose-Capillary 01/29/2022 153 (H)  70 - 99 mg/dL Final   Glucose reference range applies only to samples taken after fasting for at least 8 hours.   Glucose-Capillary 01/29/2022 166 (H)  70 - 99 mg/dL Final   Glucose reference range applies only to samples taken after fasting for at least 8 hours.   Glucose-Capillary 01/30/2022 142 (H)  70 - 99 mg/dL Final   Glucose reference range applies only to samples taken after fasting for at least 8 hours.   Glucose-Capillary 01/30/2022 138 (H)  70 - 99 mg/dL Final   Glucose reference range applies only to samples taken after fasting for at least 8 hours.   Glucose-Capillary 01/30/2022 185 (H)  70 - 99 mg/dL Final   Glucose reference range applies only to samples taken after fasting for at least 8 hours.   Glucose-Capillary 01/30/2022 220 (H)  70 - 99 mg/dL Final   Glucose reference range applies only to samples taken after fasting for at least 8 hours.   Glucose-Capillary 01/31/2022 147 (H)  70 - 99 mg/dL Final   Glucose reference range applies only to samples taken after fasting for at least 8 hours.   Glucose-Capillary 01/31/2022 131 (H)  70 - 99 mg/dL Final   Glucose reference range applies only to samples taken after fasting for at least 8 hours.   Glucose-Capillary 01/31/2022 169 (H)  70 - 99 mg/dL Final   Glucose reference range applies only to samples taken after fasting for at least 8 hours.   Glucose-Capillary 01/31/2022 144 (H)  70 - 99 mg/dL Final   Glucose reference range applies only to samples taken after fasting  for at least 8 hours.   Comment 1 01/31/2022 RN AWARE   Final   Glucose-Capillary 02/01/2022 117 (H)  70 - 99 mg/dL Final   Glucose reference range applies only to samples taken after fasting for at least 8 hours.    Glucose-Capillary 02/01/2022 180 (H)  70 - 99 mg/dL Final   Glucose reference range applies only to samples taken after fasting for at least 8 hours.   Glucose-Capillary 02/01/2022 209 (H)  70 - 99 mg/dL Final   Glucose reference range applies only to samples taken after fasting for at least 8 hours.   Glucose-Capillary 02/02/2022 131 (H)  70 - 99 mg/dL Final   Glucose reference range applies only to samples taken after fasting for at least 8 hours.   WBC 02/02/2022 7.5  4.0 - 10.5 K/uL Final   RBC 02/02/2022 4.52  3.87 - 5.11 MIL/uL Final   Hemoglobin 02/02/2022 11.3 (L)  12.0 - 15.0 g/dL Final   HCT 02/02/2022 35.0 (L)  36.0 - 46.0 % Final   MCV 02/02/2022 77.4 (L)  80.0 - 100.0 fL Final   MCH 02/02/2022 25.0 (L)  26.0 - 34.0 pg Final   MCHC 02/02/2022 32.3  30.0 - 36.0 g/dL Final   RDW 02/02/2022 16.7 (H)  11.5 - 15.5 % Final   Platelets 02/02/2022 345  150 - 400 K/uL Final   nRBC 02/02/2022 0.0  0.0 - 0.2 % Final   Neutrophils Relative % 02/02/2022 63  % Final   Neutro Abs 02/02/2022 4.7  1.7 - 7.7 K/uL Final   Lymphocytes Relative 02/02/2022 28  % Final   Lymphs Abs 02/02/2022 2.1  0.7 - 4.0 K/uL Final   Monocytes Relative 02/02/2022 7  % Final   Monocytes Absolute 02/02/2022 0.5  0.1 - 1.0 K/uL Final   Eosinophils Relative 02/02/2022 1  % Final   Eosinophils Absolute 02/02/2022 0.1  0.0 - 0.5 K/uL Final   Basophils Relative 02/02/2022 1  % Final   Basophils Absolute 02/02/2022 0.1  0.0 - 0.1 K/uL Final   Immature Granulocytes 02/02/2022 0  % Final   Abs Immature Granulocytes 02/02/2022 0.03  0.00 - 0.07 K/uL Final   Performed at [facility], [place] [address], [place], [place] [address]   Glucose-Capillary 02/02/2022 95  70 - 99 mg/dL Final   Glucose reference range applies only to samples taken after fasting for at least 8 hours.   Glucose-Capillary 02/02/2022 120 (H)  70 - 99 mg/dL Final   Glucose reference range applies only to samples taken after fasting for at least 8  hours.   Glucose-Capillary 02/02/2022 191 (H)  70 - 99 mg/dL Final   Glucose reference range applies only to samples taken after fasting for at least 8 hours.   Glucose-Capillary 02/03/2022 148 (H)  70 - 99 mg/dL Final   Glucose reference range applies only to samples taken after fasting for at least 8 hours.   Glucose-Capillary 02/03/2022 122 (H)  70 - 99 mg/dL Final   Glucose reference range applies only to samples taken after fasting for at least 8 hours.   Glucose-Capillary 02/03/2022 233 (H)  70 - 99 mg/dL Final   Glucose reference range applies only to samples taken after fasting for at least 8 hours.   Glucose-Capillary 02/04/2022 126 (H)  70 - 99 mg/dL Final   Glucose reference range applies only to samples taken after fasting for at least 8 hours.   Glucose-Capillary 02/04/2022 117 (H)  70 - 99 mg/dL Final   Glucose reference range applies only to samples taken after fasting for at least 8 hours.   Glucose-Capillary 02/04/2022 135 (H)  70 - 99 mg/dL Final   Glucose reference range applies only to samples taken after fasting for at least 8 hours.   Glucose-Capillary 02/04/2022 197 (H)  70 - 99 mg/dL Final   Glucose reference range applies only to samples taken after fasting for at least 8 hours.   Glucose-Capillary 02/05/2022 170 (H)  70 - 99 mg/dL Final   Glucose reference range applies only to samples taken after fasting for at least 8 hours.   Glucose-Capillary 02/05/2022 107 (H)  70 - 99 mg/dL Final   Glucose reference range applies only to samples taken after fasting for at least 8 hours.   Glucose-Capillary 02/05/2022 184 (H)  70 - 99 mg/dL Final   Glucose reference range applies only to samples taken after fasting for at least 8 hours.   Glucose-Capillary 02/05/2022 256 (H)  70 - 99 mg/dL Final   Glucose reference range applies only to samples taken after fasting for at least 8 hours.   Glucose-Capillary 02/06/2022 144 (H)  70 - 99 mg/dL Final   Glucose reference range  applies only to samples taken after fasting for at least 8 hours.   Glucose-Capillary 02/06/2022 190 (H)  70 - 99 mg/dL Final   Glucose reference range applies only to samples taken after fasting for at least 8 hours.   Glucose-Capillary 02/06/2022 92  70 - 99 mg/dL Final   Glucose reference range applies only to samples taken after fasting for at least 8 hours.   Trichomonas 02/06/2022 Negative   Final   Bacterial Vaginitis-Urine 02/06/2022 Negative   Final   Molecular Comment 02/06/2022 For tests bacteria and/or candida, this specimen does not meet the   Final   Molecular Comment 02/06/2022 strict criteria set by the FDA. The result interpretation should be   Final   Molecular Comment 02/06/2022 considered in conjunction with the patient's clinical history.   Final   Comment 02/06/2022 Normal Reference Range Trichomonas - Negative   Final   Glucose-Capillary 02/06/2022 197 (H)  70 - 99 mg/dL Final   Glucose reference range applies only to samples taken after fasting for at least 8 hours.   Glucose-Capillary 02/07/2022 129 (H)  70 - 99 mg/dL Final   Glucose reference range applies only to samples taken after fasting for at least 8 hours.   Glucose-Capillary 02/07/2022 207 (H)  70 - 99 mg/dL Final   Glucose reference range applies only to samples taken after fasting for at least 8 hours.   Glucose-Capillary 02/07/2022 153 (H)  70 - 99 mg/dL Final   Glucose reference range applies only to samples taken after fasting for at least 8 hours.   Glucose-Capillary 02/08/2022 129 (H)  70 - 99 mg/dL Final   Glucose reference range applies only to samples taken after fasting for at least 8 hours.   Glucose-Capillary 02/08/2022 107 (H)  70 - 99 mg/dL Final   Glucose reference range applies only to samples taken after fasting for at least 8 hours.  Admission on 01/23/2022, Discharged on 01/24/2022  Component Date Value Ref Range Status   Sodium 01/23/2022 140  135 - 145 mmol/L Final   Potassium  01/23/2022 4.4  3.5 - 5.1 mmol/L Final   Chloride 01/23/2022 101  98 - 111 mmol/L Final   CO2 01/23/2022 25  22 - 32 mmol/L Final  Glucose, Bld 01/23/2022 198 (H)  70 - 99 mg/dL Final   Glucose reference range applies only to samples taken after fasting for at least 8 hours.   BUN 01/23/2022 13  6 - 20 mg/dL Final   Creatinine, Ser 01/23/2022 0.62  0.44 - 1.00 mg/dL Final   Calcium 01/23/2022 9.3  8.9 - 10.3 mg/dL Final   GFR, Estimated 01/23/2022 >60  >60 mL/min Final   Comment: (NOTE) Calculated using the CKD-EPI Creatinine Equation (2021)    Anion gap 01/23/2022 14  5 - 15 Final   Performed at [facility], [place] [address], [place], [place] [address]   WBC 01/23/2022 5.8  4.0 - 10.5 K/uL Final   RBC 01/23/2022 4.63  3.87 - 5.11 MIL/uL Final   Hemoglobin 01/23/2022 11.7 (L)  12.0 - 15.0 g/dL Final   HCT 01/23/2022 37.4  36.0 - 46.0 % Final   MCV 01/23/2022 80.8  80.0 - 100.0 fL Final   MCH 01/23/2022 25.3 (L)  26.0 - 34.0 pg Final   MCHC 01/23/2022 31.3  30.0 - 36.0 g/dL Final   RDW 01/23/2022 17.9 (H)  11.5 - 15.5 % Final   Platelets 01/23/2022 333  150 - 400 K/uL Final   nRBC 01/23/2022 0.0  0.0 - 0.2 % Final   Performed at [facility] [address], [place], [place] [address]   Troponin I (High Sensitivity) 01/23/2022 6  <18 ng/L Final   Comment: (NOTE) Elevated high sensitivity troponin I (hsTnI) values and significant  changes across serial measurements may suggest ACS but many other  chronic and acute conditions are known to elevate hsTnI results.  Refer to the "Links" section for chest pain algorithms and additional  guidance. Performed at [facility], [place] [address], [place], [place] [address]    Troponin I (High Sensitivity) 01/23/2022 5  <18 ng/L Final   Comment: (NOTE) Elevated high sensitivity troponin I (hsTnI) values and significant  changes across serial measurements may suggest ACS but many other  chronic and acute conditions are known to  elevate hsTnI results.  Refer to the "Links" section for chest pain algorithms and additional  guidance. Performed at [facility], [place] [address], [place], [place] [address]    SARS Coronavirus 2 by RT PCR 01/23/2022 NEGATIVE  NEGATIVE Final   Comment: (NOTE) SARS-CoV-2 target nucleic acids are NOT DETECTED.  The SARS-CoV-2 RNA is generally detectable in upper and lower respiratory specimens during the acute phase of infection. The lowest concentration of SARS-CoV-2 viral copies this assay can detect is 250 copies / mL. A negative result does not preclude SARS-CoV-2 infection and should not be used as the sole basis for treatment or other patient management decisions.  A negative result may occur with improper specimen collection / handling, submission of specimen other than nasopharyngeal swab, presence of viral mutation(s) within the areas targeted by this assay, and inadequate number of viral copies (<250 copies / mL). A negative result must be combined with clinical observations, patient history, and epidemiological information.  Fact Sheet for Patients:   https://www.patel.info/  Fact Sheet for Healthcare Providers: https://hall.com/  This test is not yet approved or                           cleared by the [place] FDA and has been authorized for detection and/or diagnosis of SARS-CoV-2 by FDA under an Emergency Use Authorization (EUA).  This EUA will remain in effect (meaning this test can be used) for the duration of the COVID-19 declaration under Section 564(b)(1) of the Act, 21 U.S.C. section 360bbb-3(b)(1), unless the authorization is terminated or revoked sooner.  Performed at [facility], [place] [address], [place], [place] [address]    B Natriuretic Peptide 01/23/2022 41.3  0.0 - 100.0 pg/mL Final   Performed at [facility] [address], [place], [place] [address]   Group A Strep by PCR  01/23/2022 NOT DETECTED  NOT DETECTED Final   Performed at [facility], [place] [address], [place], [place] [address]   Color, Urine 01/23/2022 YELLOW  YELLOW Final   APPearance 01/23/2022 CLEAR  CLEAR Final   Specific Gravity, Urine 01/23/2022 1.018  1.005 - 1.030 Final   pH 01/23/2022 7.0  5.0 - 8.0 Final   Glucose, UA 01/23/2022 NEGATIVE  NEGATIVE mg/dL Final   Hgb urine dipstick 01/23/2022 NEGATIVE  NEGATIVE Final   Bilirubin Urine 01/23/2022 NEGATIVE  NEGATIVE Final   Ketones, ur 01/23/2022 NEGATIVE  NEGATIVE mg/dL Final   Protein, ur 01/23/2022 NEGATIVE  NEGATIVE mg/dL Final   Nitrite 01/23/2022 NEGATIVE  NEGATIVE Final   Leukocytes,Ua 01/23/2022 TRACE (A)  NEGATIVE Final   RBC / HPF 01/23/2022 0-5  0 - 5 RBC/hpf Final   WBC, UA 01/23/2022 0-5  0 - 5 WBC/hpf Final   Bacteria, UA 01/23/2022 NONE SEEN  NONE SEEN Final   Squamous Epithelial / LPF 01/23/2022 0-5  0 - 5 Final   Mucus 01/23/2022 PRESENT   Final   Performed at [facility], [place] [address], [place], [place] [address]   SARS Coronavirus 2 by RT PCR 01/23/2022 NEGATIVE  NEGATIVE Final   Comment: (NOTE) SARS-CoV-2 target nucleic acids are NOT DETECTED.  The SARS-CoV-2 RNA is generally detectable in upper respiratory specimens during the acute phase of infection. The lowest concentration of SARS-CoV-2 viral copies this assay can detect is 138 copies/mL. A negative result does not preclude SARS-Cov-2 infection and should not be used as the sole basis for treatment or other patient management decisions. A negative result may occur with  improper specimen collection/handling, submission of specimen other than nasopharyngeal swab, presence of viral mutation(s) within the areas targeted by this assay, and inadequate number of viral copies(<138 copies/mL). A negative result must be combined with clinical observations, patient history, and epidemiological information. The expected result is Negative.  Fact Sheet for  Patients:  EntrepreneurPulse.com.au  Fact Sheet for Healthcare Providers:  IncredibleEmployment.be  This test is no                          t yet approved or cleared by the [place] FDA and  has been authorized for detection and/or diagnosis of SARS-CoV-2 by FDA under an Emergency Use Authorization (EUA). This EUA will remain  in effect (meaning this test can be used) for the duration of the COVID-19 declaration under Section 564(b)(1) of the Act, 21 U.S.C.section 360bbb-3(b)(1), unless the authorization is terminated  or revoked sooner.       Influenza A by PCR 01/23/2022 NEGATIVE  NEGATIVE Final   Influenza B by PCR 01/23/2022 NEGATIVE  NEGATIVE Final   Comment: (NOTE) The Xpert Xpress SARS-CoV-2/FLU/RSV plus assay is intended as an aid in the diagnosis of influenza from Nasopharyngeal swab specimens and should not be used as a sole basis for treatment. Nasal washings and aspirates are unacceptable  for Xpert Xpress SARS-CoV-2/FLU/RSV testing.  Fact Sheet for Patients: EntrepreneurPulse.com.au  Fact Sheet for Healthcare Providers: IncredibleEmployment.be  This test is not yet approved or cleared by the [place] FDA and has been authorized for detection and/or diagnosis of SARS-CoV-2 by FDA under an Emergency Use Authorization (EUA). This EUA will remain in effect (meaning this test can be used) for the duration of the COVID-19 declaration under Section 564(b)(1) of the Act, 21 U.S.C. section 360bbb-3(b)(1), unless the authorization is terminated or revoked.  Performed at [facility], [place] [address], [place], [place] [address]    WBC 01/24/2022 6.1  4.0 - 10.5 K/uL Final   RBC 01/24/2022 4.60  3.87 - 5.11 MIL/uL Final   Hemoglobin 01/24/2022 11.7 (L)  12.0 - 15.0 g/dL Final   HCT 01/24/2022 37.0  36.0 - 46.0 % Final   MCV 01/24/2022 80.4  80.0 - 100.0 fL Final   MCH 01/24/2022 25.4  (L)  26.0 - 34.0 pg Final   MCHC 01/24/2022 31.6  30.0 - 36.0 g/dL Final   RDW 01/24/2022 17.6 (H)  11.5 - 15.5 % Final   Platelets 01/24/2022 334  150 - 400 K/uL Final   nRBC 01/24/2022 0.0  0.0 - 0.2 % Final   Neutrophils Relative % 01/24/2022 53  % Final   Neutro Abs 01/24/2022 3.3  1.7 - 7.7 K/uL Final   Lymphocytes Relative 01/24/2022 33  % Final   Lymphs Abs 01/24/2022 2.0  0.7 - 4.0 K/uL Final   Monocytes Relative 01/24/2022 11  % Final   Monocytes Absolute 01/24/2022 0.7  0.1 - 1.0 K/uL Final   Eosinophils Relative 01/24/2022 2  % Final   Eosinophils Absolute 01/24/2022 0.1  0.0 - 0.5 K/uL Final   Basophils Relative 01/24/2022 1  % Final   Basophils Absolute 01/24/2022 0.1  0.0 - 0.1 K/uL Final   Immature Granulocytes 01/24/2022 0  % Final   Abs Immature Granulocytes 01/24/2022 0.02  0.00 - 0.07 K/uL Final   Performed at [facility], [place] [address], [place], [place] [address]   Glucose-Capillary 01/24/2022 110 (H)  70 - 99 mg/dL Final   Glucose reference range applies only to samples taken after fasting for at least 8 hours.  No results displayed because visit has over 200 results.    Admission on 11/22/2021, Discharged on 11/22/2021  Component Date Value Ref Range Status   Glucose-Capillary 11/22/2021 169 (H)  70 - 99 mg/dL Final   Glucose reference range applies only to samples taken after fasting for at least 8 hours.  No results displayed because visit has over 200 results.    Admission on 10/04/2021, Discharged on 10/22/2021  Component Date Value Ref Range Status   SARS Coronavirus 2 by RT PCR 10/04/2021 NEGATIVE  NEGATIVE Final   Comment: (NOTE) SARS-CoV-2 target nucleic acids are NOT DETECTED.  The SARS-CoV-2 RNA is generally detectable in upper respiratory specimens during the acute phase of infection. The lowest concentration of SARS-CoV-2 viral copies this assay can detect is 138 copies/mL. A negative result does not preclude SARS-Cov-2 infection and  should not be used as the sole basis for treatment or other patient management decisions. A negative result may occur with  improper specimen collection/handling, submission of specimen other than nasopharyngeal swab, presence of viral mutation(s) within the areas targeted by this assay, and inadequate number of viral copies(<138 copies/mL). A negative result must be combined with clinical observations, patient history, and epidemiological information. The expected result  is Negative.  Fact Sheet for Patients:  EntrepreneurPulse.com.au  Fact Sheet for Healthcare Providers:  IncredibleEmployment.be  This test is no                          t yet approved or cleared by the [place] FDA and  has been authorized for detection and/or diagnosis of SARS-CoV-2 by FDA under an Emergency Use Authorization (EUA). This EUA will remain  in effect (meaning this test can be used) for the duration of the COVID-19 declaration under Section 564(b)(1) of the Act, 21 U.S.C.section 360bbb-3(b)(1), unless the authorization is terminated  or revoked sooner.       Influenza A by PCR 10/04/2021 NEGATIVE  NEGATIVE Final   Influenza B by PCR 10/04/2021 NEGATIVE  NEGATIVE Final   Comment: (NOTE) The Xpert Xpress SARS-CoV-2/FLU/RSV plus assay is intended as an aid in the diagnosis of influenza from Nasopharyngeal swab specimens and should not be used as a sole basis for treatment. Nasal washings and aspirates are unacceptable for Xpert Xpress SARS-CoV-2/FLU/RSV testing.  Fact Sheet for Patients: EntrepreneurPulse.com.au  Fact Sheet for Healthcare Providers: IncredibleEmployment.be  This test is not yet approved or cleared by the [place] FDA and has been authorized for detection and/or diagnosis of SARS-CoV-2 by FDA under an Emergency Use Authorization (EUA). This EUA will remain in effect (meaning this test can be used)  for the duration of the COVID-19 declaration under Section 564(b)(1) of the Act, 21 U.S.C. section 360bbb-3(b)(1), unless the authorization is terminated or revoked.  Performed at [facility], [place] [address], [place], [place] [address]    WBC 10/04/2021 8.4  4.0 - 10.5 K/uL Final   RBC 10/04/2021 4.42  3.87 - 5.11 MIL/uL Final   Hemoglobin 10/04/2021 11.6 (L)  12.0 - 15.0 g/dL Final   HCT 10/04/2021 35.7 (L)  36.0 - 46.0 % Final   MCV 10/04/2021 80.8  80.0 - 100.0 fL Final   MCH 10/04/2021 26.2  26.0 - 34.0 pg Final   MCHC 10/04/2021 32.5  30.0 - 36.0 g/dL Final   RDW 10/04/2021 16.0 (H)  11.5 - 15.5 % Final   Platelets 10/04/2021 372  150 - 400 K/uL Final   nRBC 10/04/2021 0.0  0.0 - 0.2 % Final   Neutrophils Relative % 10/04/2021 68  % Final   Neutro Abs 10/04/2021 5.7  1.7 - 7.7 K/uL Final   Lymphocytes Relative 10/04/2021 22  % Final   Lymphs Abs 10/04/2021 1.8  0.7 - 4.0 K/uL Final   Monocytes Relative 10/04/2021 8  % Final   Monocytes Absolute 10/04/2021 0.7  0.1 - 1.0 K/uL Final   Eosinophils Relative 10/04/2021 1  % Final   Eosinophils Absolute 10/04/2021 0.1  0.0 - 0.5 K/uL Final   Basophils Relative 10/04/2021 1  % Final   Basophils Absolute 10/04/2021 0.1  0.0 - 0.1 K/uL Final   Immature Granulocytes 10/04/2021 0  % Final   Abs Immature Granulocytes 10/04/2021 0.03  0.00 - 0.07 K/uL Final   Performed at [facility], [place] [address], [place], [place] [address]   Sodium 10/04/2021 136  135 - 145 mmol/L Final   Potassium 10/04/2021 4.2  3.5 - 5.1 mmol/L Final   Chloride 10/04/2021 104  98 - 111 mmol/L Final   CO2 10/04/2021 25  22 - 32 mmol/L Final   Glucose, Bld 10/04/2021 100 (H)  70 - 99 mg/dL Final   Glucose reference  range applies only to samples taken after fasting for at least 8 hours.   BUN 10/04/2021 9  6 - 20 mg/dL Final   Creatinine, Ser 10/04/2021 0.52  0.44 - 1.00 mg/dL Final   Calcium 10/04/2021 9.0  8.9 - 10.3 mg/dL Final   Total Protein  10/04/2021 7.0  6.5 - 8.1 g/dL Final   Albumin 10/04/2021 3.2 (L)  3.5 - 5.0 g/dL Final   AST 10/04/2021 13 (L)  15 - 41 U/L Final   ALT 10/04/2021 10  0 - 44 U/L Final   Alkaline Phosphatase 10/04/2021 61  38 - 126 U/L Final   Total Bilirubin 10/04/2021 0.3  0.3 - 1.2 mg/dL Final   GFR, Estimated 10/04/2021 >60  >60 mL/min Final   Comment: (NOTE) Calculated using the CKD-EPI Creatinine Equation (2021)    Anion gap 10/04/2021 7  5 - 15 Final   Performed at [facility] [address] [place], [place] [address]   Hgb A1c MFr Bld 10/04/2021 7.0 (H)  4.8 - 5.6 % Final   Comment: (NOTE) Pre diabetes:          5.7%-6.4%  Diabetes:              >6.4%  Glycemic control for   <7.0% adults with diabetes    Mean Plasma Glucose 10/04/2021 154.2  mg/dL Final   Performed at [facility], [place] [address], [place], [place] [address]   Cholesterol 10/04/2021 178  0 - 200 mg/dL Final   Triglycerides 10/04/2021 155 (H)  <150 mg/dL Final   HDL 10/04/2021 52  >40 mg/dL Final   Total CHOL/HDL Ratio 10/04/2021 3.4  RATIO Final   VLDL 10/04/2021 31  0 - 40 mg/dL Final   LDL Cholesterol 10/04/2021 95  0 - 99 mg/dL Final   Comment:        Total Cholesterol/HDL:CHD Risk Coronary Heart Disease Risk Table                     Men   Women  1/2 Average Risk   3.4   3.3  Average Risk       5.0   4.4  2 X Average Risk   9.6   7.1  3 X Average Risk  23.4   11.0        Use the calculated Patient Ratio above and the CHD Risk Table to determine the patient's CHD Risk.        ATP III CLASSIFICATION (LDL):  <100     mg/dL   Optimal  100-129  mg/dL   Near or Above                    Optimal  130-159  mg/dL   Borderline  160-189  mg/dL   High  >190     mg/dL   Very High Performed at [place] [address], [place], [place] [address]    POC Amphetamine UR 10/04/2021 None Detected  NONE DETECTED (Cut Off Level 1000 ng/mL) Final   POC Secobarbital (BAR) 10/04/2021 None Detected  NONE  DETECTED (Cut Off Level 300 ng/mL) Final   POC Buprenorphine (BUP) 10/04/2021 None Detected  NONE DETECTED (Cut Off Level 10 ng/mL) Final   POC Oxazepam (BZO) 10/04/2021 None Detected  NONE DETECTED (Cut Off Level 300 ng/mL) Final   POC Cocaine UR 10/04/2021 None Detected  NONE DETECTED (Cut Off Level 300 ng/mL) Final  POC Methamphetamine UR 10/04/2021 None Detected  NONE DETECTED (Cut Off Level 1000 ng/mL) Final   POC Morphine 10/04/2021 None Detected  NONE DETECTED (Cut Off Level 300 ng/mL) Final   POC Methadone UR 10/04/2021 None Detected  NONE DETECTED (Cut Off Level 300 ng/mL) Final   POC Oxycodone UR 10/04/2021 Positive (A)  NONE DETECTED (Cut Off Level 100 ng/mL) Final   POC Marijuana UR 10/04/2021 None Detected  NONE DETECTED (Cut Off Level 50 ng/mL) Final   SARSCOV2ONAVIRUS 2 AG 10/04/2021 NEGATIVE  NEGATIVE Final   Comment: (NOTE) SARS-CoV-2 antigen NOT DETECTED.   Negative results are presumptive.  Negative results do not preclude SARS-CoV-2 infection and should not be used as the sole basis for treatment or other patient management decisions, including infection  control decisions, particularly in the presence of clinical signs and  symptoms consistent with COVID-19, or in those who have been in contact with the virus.  Negative results must be combined with clinical observations, patient history, and epidemiological information. The expected result is Negative.  Fact Sheet for Patients: HandmadeRecipes.com.cy  Fact Sheet for Healthcare Providers: FuneralLife.at  This test is not yet approved or cleared by the [place] FDA and  has been authorized for detection and/or diagnosis of SARS-CoV-2 by FDA under an Emergency Use Authorization (EUA).  This EUA will remain in effect (meaning this test can be used) for the duration of  the COV                          ID-19 declaration under Section 564(b)(1) of the Act, 21 U.S.C.  section 360bbb-3(b)(1), unless the authorization is terminated or revoked sooner.     Valproic Acid Lvl 10/04/2021 51  50.0 - 100.0 ug/mL Final   Performed at [facility] [address], [place], [place] [address]   Valproic Acid Lvl 10/08/2021 57  50.0 - 100.0 ug/mL Final   Performed at [facility] [address] [place], [place] [address]   Glucose-Capillary 10/09/2021 123 (H)  70 - 99 mg/dL Final   Glucose reference range applies only to samples taken after fasting for at least 8 hours.  There may be more visits with results that are not included.    Blood Alcohol level:  Lab Results  Component Value Date   ETH <10 11/23/2021   ETH <10 37/62/8315    Metabolic Disorder Labs: Lab Results  Component Value Date   HGBA1C 6.7 (H) 12/28/2021   MPG 145.59 12/28/2021   MPG 154.2 10/04/2021   Lab Results  Component Value Date   PROLACTIN 3.8 (L) 11/23/2021   Lab Results  Component Value Date   CHOL 171 11/23/2021   TRIG 125 11/23/2021   HDL 65 11/23/2021   CHOLHDL 2.6 11/23/2021   VLDL 25 11/23/2021   LDLCALC 81 11/23/2021   LDLCALC 95 10/04/2021    Therapeutic Lab Levels: No results found for: "LITHIUM" Lab Results  Component Value Date   VALPROATE 17 (L) 02/17/2022   VALPROATE 33 (L) 01/18/2022   No results found for: "CBMZ"  Physical Findings   PHQ2-9    Flowsheet Row ED [facility] 11[facility]23/2021 in [facility]  PHQ-2 Total Score 2  PHQ-9 Total Score 3      Flowsheet Row ED from 02/18/2022 in [facility] ED from 02/17/2022 in [place] [facility] ED from 02/09/2022 in [place]  RISK CATEGORY No Risk No Risk No Risk        Musculoskeletal  Strength & Muscle Tone: within normal limits Gait & Station: normal Patient leans: N/A  Psychiatric Specialty Exam  Presentation  General Appearance:  Appropriate for Environment;  Casual  Eye Contact: Good  Speech: Clear and Coherent; Normal Rate  Speech Volume: Normal  Handedness: Right   Mood and Affect  Mood: Euthymic  Affect: Congruent   Thought Process  Thought Processes: Coherent; Goal Directed; Linear  Descriptions of Associations:Intact  Orientation:Full (Time, Place and Person)  Thought Content:Logical  Diagnosis of Schizophrenia or Schizoaffective disorder in past: Yes  Duration of Psychotic Symptoms: No data recorded  Hallucinations:Hallucinations: None  Ideas of Reference:None  Suicidal Thoughts:Suicidal Thoughts: No  Homicidal Thoughts:Homicidal Thoughts: No   Sensorium  Memory: Immediate Good; Recent Fair  Judgment: Intact  Insight: Present   Executive Functions  Concentration: Good  Attention Span: Good  Recall: Good  Fund of Knowledge: Fair  Language: Fair   Psychomotor Activity  Psychomotor Activity: Psychomotor Activity: Normal   Assets  Assets: Communication Skills; Desire for Improvement; Financial Resources/Insurance   Sleep  Sleep: Sleep: Good   No data recorded  Physical Exam  Physical Exam Vitals and nursing note reviewed.  Constitutional:      Appearance: Normal appearance. She is well-developed.  HENT:     Head: Normocephalic and atraumatic.     Nose: Nose normal.  Cardiovascular:     Rate and Rhythm: Normal rate.  Pulmonary:     Effort: Pulmonary effort is normal.  Musculoskeletal:        General: Normal range of motion.     Cervical back: Normal range of motion.  Skin:    General: Skin is warm and dry.  Neurological:     Mental Status: She is alert and oriented to person, place, and time.  Psychiatric:        Attention and Perception: Attention and perception normal.        Mood and Affect: Mood and affect normal.        Speech: Speech normal.        Behavior: Behavior normal. Behavior is cooperative.        Thought Content: Thought content normal.         Cognition and Memory: Cognition normal.    Review of Systems  Constitutional: Negative.   HENT: Negative.    Eyes: Negative.   Respiratory: Negative.    Cardiovascular: Negative.   Gastrointestinal: Negative.   Genitourinary: Negative.   Musculoskeletal: Negative.   Skin: Negative.   Neurological: Negative.   Psychiatric/Behavioral: Negative.     Blood pressure (!) 141/91, pulse (!) 101, temperature 98.1 F (36.7 C), temperature source Oral, resp. rate 18, SpO2 98 %. There is no height or weight on file to calculate BMI.  Treatment Plan Summary: Daily contact with patient to assess and evaluate symptoms and progress in treatment  Patient remains cleared by psychiatry, transition of care team assisting with disposition planning.  Lucky Rathke, FNP 02/24/2022 1:17 PM

## 2022-02-24 NOTE — ED Notes (Signed)
Snacks given 

## 2022-02-24 NOTE — Care Management (Signed)
[facility] Care Management   Treatment Team Meeting   In attendance  Teresa Murillo - Patient  Nena Jordan - [facility] Case Manager    During the meeting Victorino Dike informed the Team that the new Case Manger will be Mason Jim [phone] and the email address is [email or web address].     Dioante is the supervior and she was not able to be in attendance due to holiday break.    Per Victorino Dike, the patient authorization is still pending approval in order the patient to move into the [facility] with Harless Nakayama.   The next Team meeting will be on 03-03-2021 at 12pm

## 2022-02-24 NOTE — ED Notes (Signed)
Pt pleasant, cooperative. Able to make needs known. Reports sleeping well and denies any acute concerns at this time. Continues to voice hopefulness about placement and had ''good interview '' yesterday. Compliant with am medications. Eating and drinking well. Pt is safe. Remains boarding.

## 2022-02-25 DIAGNOSIS — F209 Schizophrenia, unspecified: Secondary | ICD-10-CM | POA: Diagnosis not present

## 2022-02-25 LAB — GLUCOSE, CAPILLARY
Glucose-Capillary: 130 mg/dL — ABNORMAL HIGH (ref 70–99)
Glucose-Capillary: 117 mg/dL — ABNORMAL HIGH (ref 70–99)
Glucose-Capillary: 104 mg/dL — ABNORMAL HIGH (ref 70–99)
Glucose-Capillary: 180 mg/dL — ABNORMAL HIGH (ref 70–99)
Glucose-Capillary: 180 mg/dL — ABNORMAL HIGH (ref 70–99)

## 2022-02-25 NOTE — ED Provider Notes (Signed)
Behavioral Health Progress Note  Date and Time: 02/25/2022 9:26 AM Name: Teresa Murillo MRN:  [id number]  Subjective:    Teresa Murillo is a 61 y.o. female with a past psychiatric history of schizophrenia, aggressive behavior, and possible intellectual disability presenting to [facility] on 11/23/21 voluntarily as a walk in via [facility] with complaints that she was locked out of the group home. Patient has been dismissed from group home due to multiple elopements. Pt had already been discharged from this facility but had been living there temporarily while DSS was looking for new placement. Pt is boarding at [facility].   AID to Capacity Evaluation (ACE) completed by Dr. Louis Meckel, Dr. Dwyane Dee on 12/11/2021.  Please see media tab for full details.    [name], PhD completed: Wechsler Adult Intelligence Scale-4, Teresa Murillo achieved a full-scale IQ score of 73 and a percentile rank of 4 placing her in the borderline range of intellectual functioning (12/14/2021) Please see consult note from [name], PhD on 12/14/2021.  On reassessment, pt is sitting up, a&ox3, in no acute distress, non toxic appearing. Pt states she received new clothes to wear. She endorses "alright", euthymic mood. She denies suicidal, homicidal, or violent ideations. She denies auditory visual hallucinations or paranoia. There is no evidence of agitation, aggression, distractibility, or internal preoccupation. No delusions or paranoia elicited. Pt is calm, cooperative, pleasant. She remains psychiatrically cleared. Disposition is pending placement, possible placement at [facility].   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 On the Wechsler Adult Intelligence Scale-4, Teresa Murillo achieved a full-scale IQ score of 73 and a percentile rank of 4 placing her in the borderline range of intellectual functioning.    Chronic obstructive pulmonary disease (COPD) (HCC)    Glaucoma    Hyperlipidemia    Hypertension    Iron deficiency    Schizoaffective disorder (HCC)    Type 2 diabetes mellitus (HCC)     Past Surgical History:  Procedure Laterality Date   TUBAL LIGATION     Family History:  Family History  Problem Relation Age of Onset   Breast cancer Maternal Grandmother    Family Psychiatric  History: None reported Social History:  Social History   Substance and Sexual Activity  Alcohol Use Not Currently     Social History   Substance and Sexual Activity  Drug Use Not Currently    Social History   Socioeconomic History   Marital status: Divorced    Spouse name: Not on file   Number of children: Not on file   Years of education: Not on file   Highest education level: Not on file  Occupational History   Not on file  Tobacco Use   Smoking status: Former    Packs/day: 1.00    Types: Cigars, Cigarettes    Quit date: 11/09/2021    Years since quitting: 0.2   Smokeless tobacco: Current  Vaping Use   Vaping Use: Never used  Substance and Sexual Activity   Alcohol use: Not Currently   Drug use: Not Currently   Sexual activity: Not Currently    Birth control/protection: Surgical  Other Topics Concern   Not on file  Social History Narrative   Not on file  Social Determinants of Health   Financial Resource Strain: Not on file  Food Insecurity: Not on file  Transportation Needs: Not on file  Physical Activity: Not on file  Stress: Not on file  Social Connections: Not on file   SDOH:  SDOH Screenings   Depression (PHQ2-9): Low Risk  (11/23/2021)  Tobacco Use: High Risk (02/17/2022)   Additional Social History:                         Sleep: Good  Appetite:   Good  Current Medications:  Current Facility-Administered Medications  Medication Dose Route Frequency Provider Last Rate Last Admin   acetaminophen (TYLENOL) tablet 650 mg  650 mg Oral Q6H PRN [name], NP   650 mg at 02/23/22 2125   albuterol (VENTOLIN HFA) 108 (90 Base) MCG/ACT inhaler 2 puff  2 puff Inhalation Q6H PRN [name], NP   2 puff at 02/20/22 1944   alum & mag hydroxide-simeth (MAALOX/MYLANTA) 200-200-20 MG/5ML suspension 30 mL  30 mL Oral Q4H PRN [name], NP   30 mL at 02/21/22 2051   apixaban (ELIQUIS) tablet 5 mg  5 mg Oral BID [name], NP   5 mg at 02/24/22 2121   ARIPiprazole (ABILIFY) tablet 10 mg  10 mg Oral Daily [name], NP   10 mg at 02/24/22 0802   cloZAPine (CLOZARIL) tablet 75 mg  75 mg Oral BID France Ravens, MD   75 mg at 02/24/22 2120   colchicine tablet 0.6 mg  0.6 mg Oral QHS [name], NP   0.6 mg at 02/24/22 2121   diltiazem (CARDIZEM CD) 24 hr capsule 240 mg  240 mg Oral Daily [name], NP   240 mg at 02/24/22 0802   divalproex (DEPAKOTE ER) 24 hr tablet 500 mg  500 mg Oral BID [name], NP   500 mg at 02/24/22 2120   haloperidol (HALDOL) tablet 10 mg  10 mg Oral TID PRN [name], NP       insulin aspart (novoLOG) injection 0-9 Units  0-9 Units Subcutaneous TID WC France Ravens, MD   1 Units at 02/24/22 1627   insulin glargine-yfgn (SEMGLEE) injection 12 Units  12 Units Subcutaneous Daily [name], NP   12 Units at 02/24/22 0924   latanoprost (XALATAN) 0.005 % ophthalmic solution 1 drop  1 drop Both Eyes QHS [name], NP   1 drop at 02/24/22 2121   magnesium hydroxide (MILK OF MAGNESIA) suspension 30 mL  30 mL Oral Daily PRN [name], NP       menthol-cetylpyridinium (CEPACOL) lozenge 3 mg  1 lozenge Oral Q4H PRN Revonda Humphrey, NP   3 mg at 02/24/22 0001   metFORMIN (GLUCOPHAGE) tablet 500 mg  500 mg Oral BID WC [name], NP   500 mg at 02/25/22 0802   mometasone-formoterol (DULERA) 200-5 MCG/ACT  inhaler 2 puff  2 puff Inhalation BID [name], NP   2 puff at 02/25/22 0802   nicotine (NICODERM CQ - dosed in mg/24 hr) patch 7 mg  7 mg Transdermal Daily [name], NP   7 mg at 02/24/22 0801   valbenazine (INGREZZA) capsule 40 mg  40 mg Oral q AM [name], NP   40 mg at 02/25/22 0606   Vitamin D (Ergocalciferol) (DRISDOL) 1.25 MG (50000 UNIT) capsule 50,000 Units  50,000 Units Oral Q Leonie Green, NP   50,000 Units at 02/18/22 [phone]  Current Outpatient Medications  Medication Sig Dispense Refill   Accu-Chek Softclix Lancets lancets Use as directed up to four times daily 100 each 0   apixaban (ELIQUIS) 5 MG TABS tablet Take 1 tablet (5 mg total) by mouth 2 (two) times daily. 60 tablet 0   ARIPiprazole (ABILIFY) 10 MG tablet Take 1 tablet (10 mg total) by mouth daily. 30 tablet 0   Blood Glucose Monitoring Suppl (ACCU-CHEK GUIDE) w/Device KIT Use as directed up to four times daily 1 kit 0   budesonide-formoterol (SYMBICORT) 160-4.5 MCG/ACT inhaler Inhale 2 puffs into the lungs in the morning and at bedtime.     Cholecalciferol (VITAMIN D3) 1.25 MG (50000 UT) CAPS Take 50,000 Units by mouth every Thursday.     clozapine (CLOZARIL) 50 MG tablet Take 1 tablet (50 mg total) by mouth daily. 30 tablet 0   colchicine 0.6 MG tablet Take 1 tablet (0.6 mg total) by mouth at bedtime. 30 tablet 0   diltiazem (CARDIZEM CD) 240 MG 24 hr capsule Take 1 capsule (240 mg total) by mouth daily. (Patient not taking: Reported on 11/24/2021) 30 capsule 0   divalproex (DEPAKOTE ER) 500 MG 24 hr tablet Take 1 tablet (500 mg total) by mouth 2 (two) times daily. 60 tablet 0   glucose blood test strip Use as directed up to four times daily 50 each 0   haloperidol (HALDOL) 10 MG tablet Take 1 tablet (10 mg total) by mouth 3 (three) times daily as needed for agitation (and psychotic symptoms).     INGREZZA 40 MG capsule Take 1 capsule (40 mg total) by mouth in the morning. 30 capsule 0   insulin aspart  (NOVOLOG) 100 UNIT/ML FlexPen Before each meal 3 times a day, 140-199 - 2 units, 200-250 - 4 units, 251-299 - 6 units,  300-349 - 8 units,  350 or above 10 units. Insulin PEN if approved, provide syringes and needles if needed.Please switch to any approved short acting Insulin if needed. 15 mL 0   insulin glargine (LANTUS) 100 UNIT/ML Solostar Pen Inject 12 Units into the skin daily. 15 mL 0   Insulin Pen Needle 32G X 4 MM MISC Use 4 times a day with insulin, 1 month supply. 100 each 0   latanoprost (XALATAN) 0.005 % ophthalmic solution Place 1 drop into both eyes at bedtime.     metFORMIN (GLUCOPHAGE) 500 MG tablet Take 500 mg by mouth 2 (two) times daily with a meal.     nicotine (NICODERM CQ - DOSED IN MG/24 HR) 7 mg/24hr patch Place 1 patch (7 mg total) onto the skin daily. 28 patch 0   PROAIR HFA 108 (90 Base) MCG/ACT inhaler Inhale 2 puffs into the lungs every 6 (six) hours as needed for wheezing or shortness of breath.      Labs  Lab Results:  Admission on 02/18/2022  Component Date Value Ref Range Status   Glucose-Capillary 02/18/2022 254 (H)  70 - 99 mg/dL Final   Glucose reference range applies only to samples taken after fasting for at least 8 hours.   Glucose-Capillary 02/18/2022 112 (H)  70 - 99 mg/dL Final   Glucose reference range applies only to samples taken after fasting for at least 8 hours.   Glucose-Capillary 02/19/2022 159 (H)  70 - 99 mg/dL Final   Glucose reference range applies only to samples taken after fasting for at least 8 hours.   Glucose-Capillary 02/19/2022 160 (H)  70 - 99 mg/dL Final  Glucose reference range applies only to samples taken after fasting for at least 8 hours.   Glucose-Capillary 02/19/2022 177 (H)  70 - 99 mg/dL Final   Glucose reference range applies only to samples taken after fasting for at least 8 hours.   Glucose-Capillary 02/19/2022 147 (H)  70 - 99 mg/dL Final   Glucose reference range applies only to samples taken after fasting for at  least 8 hours.   Glucose-Capillary 02/20/2022 126 (H)  70 - 99 mg/dL Final   Glucose reference range applies only to samples taken after fasting for at least 8 hours.   Glucose-Capillary 02/20/2022 132 (H)  70 - 99 mg/dL Final   Glucose reference range applies only to samples taken after fasting for at least 8 hours.   Glucose-Capillary 02/20/2022 125 (H)  70 - 99 mg/dL Final   Glucose reference range applies only to samples taken after fasting for at least 8 hours.   Glucose-Capillary 02/21/2022 151 (H)  70 - 99 mg/dL Final   Glucose reference range applies only to samples taken after fasting for at least 8 hours.   Glucose-Capillary 02/21/2022 106 (H)  70 - 99 mg/dL Final   Glucose reference range applies only to samples taken after fasting for at least 8 hours.   Glucose-Capillary 02/21/2022 149 (H)  70 - 99 mg/dL Final   Glucose reference range applies only to samples taken after fasting for at least 8 hours.   Glucose-Capillary 02/22/2022 131 (H)  70 - 99 mg/dL Final   Glucose reference range applies only to samples taken after fasting for at least 8 hours.   Glucose-Capillary 02/22/2022 141 (H)  70 - 99 mg/dL Final   Glucose reference range applies only to samples taken after fasting for at least 8 hours.   Glucose-Capillary 02/22/2022 183 (H)  70 - 99 mg/dL Final   Glucose reference range applies only to samples taken after fasting for at least 8 hours.   Glucose-Capillary 02/23/2022 133 (H)  70 - 99 mg/dL Final   Glucose reference range applies only to samples taken after fasting for at least 8 hours.   WBC 02/23/2022 4.8  4.0 - 10.5 K/uL Final   RBC 02/23/2022 3.96  3.87 - 5.11 MIL/uL Final   Hemoglobin 02/23/2022 9.7 (L)  12.0 - 15.0 g/dL Final   HCT 02/23/2022 30.6 (L)  36.0 - 46.0 % Final   MCV 02/23/2022 77.3 (L)  80.0 - 100.0 fL Final   MCH 02/23/2022 24.5 (L)  26.0 - 34.0 pg Final   MCHC 02/23/2022 31.7  30.0 - 36.0 g/dL Final   RDW 02/23/2022 16.5 (H)  11.5 - 15.5 % Final    Platelets 02/23/2022 372  150 - 400 K/uL Final   nRBC 02/23/2022 0.0  0.0 - 0.2 % Final   Neutrophils Relative % 02/23/2022 56  % Final   Neutro Abs 02/23/2022 2.7  1.7 - 7.7 K/uL Final   Lymphocytes Relative 02/23/2022 32  % Final   Lymphs Abs 02/23/2022 1.5  0.7 - 4.0 K/uL Final   Monocytes Relative 02/23/2022 9  % Final   Monocytes Absolute 02/23/2022 0.4  0.1 - 1.0 K/uL Final   Eosinophils Relative 02/23/2022 2  % Final   Eosinophils Absolute 02/23/2022 0.1  0.0 - 0.5 K/uL Final   Basophils Relative 02/23/2022 1  % Final   Basophils Absolute 02/23/2022 0.1  0.0 - 0.1 K/uL Final   Immature Granulocytes 02/23/2022 0  % Final   Abs Immature Granulocytes  02/23/2022 0.01  0.00 - 0.07 K/uL Final   Performed at [facility], [place] [address], [place], [place] [address]   Glucose-Capillary 02/23/2022 202 (H)  70 - 99 mg/dL Final   Glucose reference range applies only to samples taken after fasting for at least 8 hours.   Glucose-Capillary 02/23/2022 143 (H)  70 - 99 mg/dL Final   Glucose reference range applies only to samples taken after fasting for at least 8 hours.   Glucose-Capillary 02/23/2022 143 (H)  70 - 99 mg/dL Final   Glucose reference range applies only to samples taken after fasting for at least 8 hours.   Glucose-Capillary 02/24/2022 207 (H)  70 - 99 mg/dL Final   Glucose reference range applies only to samples taken after fasting for at least 8 hours.   Glucose-Capillary 02/24/2022 109 (H)  70 - 99 mg/dL Final   Glucose reference range applies only to samples taken after fasting for at least 8 hours.   Glucose-Capillary 02/24/2022 127 (H)  70 - 99 mg/dL Final   Glucose reference range applies only to samples taken after fasting for at least 8 hours.   Glucose-Capillary 02/24/2022 130 (H)  70 - 99 mg/dL Final   Glucose reference range applies only to samples taken after fasting for at least 8 hours.   Glucose-Capillary 02/25/2022 117 (H)  70 - 99 mg/dL Final   Glucose  reference range applies only to samples taken after fasting for at least 8 hours.  Admission on 02/17/2022, Discharged on 02/18/2022  Component Date Value Ref Range Status   Sodium 02/17/2022 136  135 - 145 mmol/L Final   Potassium 02/17/2022 3.9  3.5 - 5.1 mmol/L Final   Chloride 02/17/2022 101  98 - 111 mmol/L Final   CO2 02/17/2022 25  22 - 32 mmol/L Final   Glucose, Bld 02/17/2022 113 (H)  70 - 99 mg/dL Final   Glucose reference range applies only to samples taken after fasting for at least 8 hours.   BUN 02/17/2022 9  6 - 20 mg/dL Final   Creatinine, Ser 02/17/2022 0.50  0.44 - 1.00 mg/dL Final   Calcium 02/17/2022 8.9  8.9 - 10.3 mg/dL Final   GFR, Estimated 02/17/2022 >60  >60 mL/min Final   Comment: (NOTE) Calculated using the CKD-EPI Creatinine Equation (2021)    Anion gap 02/17/2022 10  5 - 15 Final   Performed at [facility], [place] [address] [place], [place] [address]   WBC 02/17/2022 6.5  4.0 - 10.5 K/uL Final   RBC 02/17/2022 4.24  3.87 - 5.11 MIL/uL Final   Hemoglobin 02/17/2022 10.2 (L)  12.0 - 15.0 g/dL Final   HCT 02/17/2022 33.2 (L)  36.0 - 46.0 % Final   MCV 02/17/2022 78.3 (L)  80.0 - 100.0 fL Final   MCH 02/17/2022 24.1 (L)  26.0 - 34.0 pg Final   MCHC 02/17/2022 30.7  30.0 - 36.0 g/dL Final   RDW 02/17/2022 17.2 (H)  11.5 - 15.5 % Final   Platelets 02/17/2022 390  150 - 400 K/uL Final   nRBC 02/17/2022 0.0  0.0 - 0.2 % Final   Performed at [facility], [place] [address] [place], [place] [address]   Troponin I (High Sensitivity) 02/17/2022 4  <18 ng/L Final   Comment: (NOTE) Elevated high sensitivity troponin I (hsTnI) values and significant  changes across serial measurements may suggest ACS but many other  chronic and acute conditions are known to  elevate hsTnI results.  Refer to the "Links" section for chest pain algorithms and additional  guidance. Performed at [facility], [place] [address] [place], [place] [address]    Valproic Acid  Lvl 02/17/2022 17 (L)  50.0 - 100.0 ug/mL Final   Performed at [facility] [address], [place], [place] [address]   B Natriuretic Peptide 02/17/2022 30.9  0.0 - 100.0 pg/mL Final   Performed at [facility] [address] [place], [place] [address]   Troponin I (High Sensitivity) 02/18/2022 4  <18 ng/L Final   Comment: (NOTE) Elevated high sensitivity troponin I (hsTnI) values and significant  changes across serial measurements may suggest ACS but many other  chronic and acute conditions are known to elevate hsTnI results.  Refer to the "Links" section for chest pain algorithms and additional  guidance. Performed at [facility], [place] [address] [place], [place] [address]    D-Dimer, Quant 02/18/2022 0.29  0.00 - 0.50 ug/mL-FEU Final   Comment: (NOTE) At the manufacturer cut-off value of 0.5 g/mL FEU, this assay has a negative predictive value of 95-100%.This assay is intended for use in conjunction with a clinical pretest probability (PTP) assessment model to exclude pulmonary embolism (PE) and deep venous thrombosis (DVT) in outpatients suspected of PE or DVT. Results should be correlated with clinical presentation. Performed at [facility], [place] [address], [place], [place] [address]   Admission on 02/09/2022, Discharged on 02/17/2022  Component Date Value Ref Range Status   Glucose-Capillary 02/09/2022 118 (H)  70 - 99 mg/dL Final   Glucose reference range applies only to samples taken after fasting for at least 8 hours.   Glucose-Capillary 02/10/2022 148 (H)  70 - 99 mg/dL Final   Glucose reference range applies only to samples taken after fasting for at least 8 hours.   Glucose-Capillary 02/10/2022 174 (H)  70 - 99 mg/dL Final   Glucose reference range applies only to samples taken after fasting for at least 8 hours.   Glucose-Capillary 02/10/2022 128 (H)  70 - 99 mg/dL Final   Glucose reference range applies only to samples taken after fasting  for at least 8 hours.   Glucose-Capillary 02/10/2022 182 (H)  70 - 99 mg/dL Final   Glucose reference range applies only to samples taken after fasting for at least 8 hours.   Glucose-Capillary 02/11/2022 158 (H)  70 - 99 mg/dL Final   Glucose reference range applies only to samples taken after fasting for at least 8 hours.   Glucose-Capillary 02/11/2022 159 (H)  70 - 99 mg/dL Final   Glucose reference range applies only to samples taken after fasting for at least 8 hours.   Glucose-Capillary 02/11/2022 153 (H)  70 - 99 mg/dL Final   Glucose reference range applies only to samples taken after fasting for at least 8 hours.   Glucose-Capillary 02/11/2022 159 (H)  70 - 99 mg/dL Final   Glucose reference range applies only to samples taken after fasting for at least 8 hours.   Glucose-Capillary 02/11/2022 153 (H)  70 - 99 mg/dL Final   Glucose reference range applies only to samples taken after fasting for at least 8 hours.   Glucose-Capillary 02/11/2022 109 (H)  70 - 99 mg/dL Final   Glucose reference range applies only to samples taken after fasting for at least 8 hours.   Glucose-Capillary 02/11/2022 213 (H)  70 - 99 mg/dL Final   Glucose  reference range applies only to samples taken after fasting for at least 8 hours.   Glucose-Capillary 02/11/2022 229 (H)  70 - 99 mg/dL Final   Glucose reference range applies only to samples taken after fasting for at least 8 hours.   Glucose-Capillary 02/12/2022 128 (H)  70 - 99 mg/dL Final   Glucose reference range applies only to samples taken after fasting for at least 8 hours.   Glucose-Capillary 02/12/2022 149 (H)  70 - 99 mg/dL Final   Glucose reference range applies only to samples taken after fasting for at least 8 hours.   Color, Urine 02/12/2022 YELLOW  YELLOW Final   APPearance 02/12/2022 CLEAR  CLEAR Final   Specific Gravity, Urine 02/12/2022 1.015  1.005 - 1.030 Final   pH 02/12/2022 5.0  5.0 - 8.0 Final   Glucose, UA 02/12/2022 NEGATIVE   NEGATIVE mg/dL Final   Hgb urine dipstick 02/12/2022 NEGATIVE  NEGATIVE Final   Bilirubin Urine 02/12/2022 NEGATIVE  NEGATIVE Final   Ketones, ur 02/12/2022 NEGATIVE  NEGATIVE mg/dL Final   Protein, ur 02/12/2022 NEGATIVE  NEGATIVE mg/dL Final   Nitrite 02/12/2022 NEGATIVE  NEGATIVE Final   Leukocytes,Ua 02/12/2022 NEGATIVE  NEGATIVE Final   Performed at [facility], [place] [address] [place], [place] [address]   Specimen Description 02/12/2022 URINE, CLEAN CATCH   Final   Special Requests 02/12/2022    Final                   Value:NONE Performed at [facility], [place] [address] [place],  [address]    Culture 02/12/2022 10,000 COLONIES/mL PROTEUS MIRABILIS (A)   Final   Report Status 02/12/2022 02/15/2022 FINAL   Final   Organism ID, Bacteria 02/12/2022 PROTEUS MIRABILIS (A)   Final   Glucose-Capillary 02/12/2022 128 (H)  70 - 99 mg/dL Final   Glucose reference range applies only to samples taken after fasting for at least 8 hours.   Glucose-Capillary 02/12/2022 196 (H)  70 - 99 mg/dL Final   Glucose reference range applies only to samples taken after fasting for at least 8 hours.   Glucose-Capillary 02/13/2022 168 (H)  70 - 99 mg/dL Final   Glucose reference range applies only to samples taken after fasting for at least 8 hours.   Glucose-Capillary 02/13/2022 153 (H)  70 - 99 mg/dL Final   Glucose reference range applies only to samples taken after fasting for at least 8 hours.   Glucose-Capillary 02/13/2022 134 (H)  70 - 99 mg/dL Final   Glucose reference range applies only to samples taken after fasting for at least 8 hours.   Glucose-Capillary 02/13/2022 178 (H)  70 - 99 mg/dL Final   Glucose reference range applies only to samples taken after fasting for at least 8 hours.   Glucose-Capillary 02/14/2022 156 (H)  70 - 99 mg/dL Final   Glucose reference range applies only to samples taken after fasting for at least 8 hours.   Glucose-Capillary 02/14/2022 169 (H)  70  - 99 mg/dL Final   Glucose reference range applies only to samples taken after fasting for at least 8 hours.   Glucose-Capillary 02/14/2022 156 (H)  70 - 99 mg/dL Final   Glucose reference range applies only to samples taken after fasting for at least 8 hours.   Glucose-Capillary 02/14/2022 148 (H)  70 - 99 mg/dL Final   Glucose reference range applies only to samples taken after fasting for at least 8 hours.  Glucose-Capillary 02/15/2022 159 (H)  70 - 99 mg/dL Final   Glucose reference range applies only to samples taken after fasting for at least 8 hours.   Glucose-Capillary 02/15/2022 125 (H)  70 - 99 mg/dL Final   Glucose reference range applies only to samples taken after fasting for at least 8 hours.   Glucose-Capillary 02/15/2022 142 (H)  70 - 99 mg/dL Final   Glucose reference range applies only to samples taken after fasting for at least 8 hours.   Glucose-Capillary 02/15/2022 128 (H)  70 - 99 mg/dL Final   Glucose reference range applies only to samples taken after fasting for at least 8 hours.   Glucose-Capillary 02/16/2022 137 (H)  70 - 99 mg/dL Final   Glucose reference range applies only to samples taken after fasting for at least 8 hours.   WBC 02/16/2022 6.0  4.0 - 10.5 K/uL Final   RBC 02/16/2022 4.48  3.87 - 5.11 MIL/uL Final   Hemoglobin 02/16/2022 10.7 (L)  12.0 - 15.0 g/dL Final   HCT 02/16/2022 35.4 (L)  36.0 - 46.0 % Final   MCV 02/16/2022 79.0 (L)  80.0 - 100.0 fL Final   MCH 02/16/2022 23.9 (L)  26.0 - 34.0 pg Final   MCHC 02/16/2022 30.2  30.0 - 36.0 g/dL Final   RDW 02/16/2022 17.0 (H)  11.5 - 15.5 % Final   Platelets 02/16/2022 387  150 - 400 K/uL Final   nRBC 02/16/2022 0.0  0.0 - 0.2 % Final   Neutrophils Relative % 02/16/2022 54  % Final   Neutro Abs 02/16/2022 3.2  1.7 - 7.7 K/uL Final   Lymphocytes Relative 02/16/2022 35  % Final   Lymphs Abs 02/16/2022 2.1  0.7 - 4.0 K/uL Final   Monocytes Relative 02/16/2022 6  % Final   Monocytes Absolute 02/16/2022  0.4  0.1 - 1.0 K/uL Final   Eosinophils Relative 02/16/2022 2  % Final   Eosinophils Absolute 02/16/2022 0.1  0.0 - 0.5 K/uL Final   Basophils Relative 02/16/2022 2  % Final   Basophils Absolute 02/16/2022 0.1  0.0 - 0.1 K/uL Final   Immature Granulocytes 02/16/2022 1  % Final   Abs Immature Granulocytes 02/16/2022 0.04  0.00 - 0.07 K/uL Final   Performed at [facility], [place] [address] [place], [place] [address]   Glucose-Capillary 02/16/2022 131 (H)  70 - 99 mg/dL Final   Glucose reference range applies only to samples taken after fasting for at least 8 hours.   Glucose-Capillary 02/16/2022 123 (H)  70 - 99 mg/dL Final   Glucose reference range applies only to samples taken after fasting for at least 8 hours.   Glucose-Capillary 02/16/2022 195 (H)  70 - 99 mg/dL Final   Glucose reference range applies only to samples taken after fasting for at least 8 hours.   Glucose-Capillary 02/17/2022 148 (H)  70 - 99 mg/dL Final   Glucose reference range applies only to samples taken after fasting for at least 8 hours.  Admission on 02/08/2022, Discharged on 02/09/2022  Component Date Value Ref Range Status   B Natriuretic Peptide 02/08/2022 40.2  0.0 - 100.0 pg/mL Final   Performed at [facility], [address], [place], [place] [address]   Troponin I (High Sensitivity) 02/08/2022 4  <18 ng/L Final   Comment: (NOTE) Elevated high sensitivity troponin I (hsTnI) values and significant  changes across serial measurements may suggest ACS but many other  chronic and acute  conditions are known to elevate hsTnI results.  Refer to the "Links" section for chest pain algorithms and additional  guidance. Performed at [facility], [place] [address] [place], [place] [address]    WBC 02/08/2022 8.5  4.0 - 10.5 K/uL Final   RBC 02/08/2022 4.30  3.87 - 5.11 MIL/uL Final   Hemoglobin 02/08/2022 10.7 (L)  12.0 - 15.0 g/dL Final   HCT 02/08/2022 33.1 (L)  36.0 - 46.0 % Final   MCV  02/08/2022 77.0 (L)  80.0 - 100.0 fL Final   MCH 02/08/2022 24.9 (L)  26.0 - 34.0 pg Final   MCHC 02/08/2022 32.3  30.0 - 36.0 g/dL Final   RDW 02/08/2022 16.5 (H)  11.5 - 15.5 % Final   Platelets 02/08/2022 361  150 - 400 K/uL Final   nRBC 02/08/2022 0.0  0.0 - 0.2 % Final   Performed at [facility] [address], [place], [place] [address]   Sodium 02/08/2022 131 (L)  135 - 145 mmol/L Final   Potassium 02/08/2022 4.4  3.5 - 5.1 mmol/L Final   Chloride 02/08/2022 96 (L)  98 - 111 mmol/L Final   CO2 02/08/2022 25  22 - 32 mmol/L Final   Glucose, Bld 02/08/2022 126 (H)  70 - 99 mg/dL Final   Glucose reference range applies only to samples taken after fasting for at least 8 hours.   BUN 02/08/2022 11  6 - 20 mg/dL Final   Creatinine, Ser 02/08/2022 0.52  0.44 - 1.00 mg/dL Final   Calcium 02/08/2022 9.4  8.9 - 10.3 mg/dL Final   Total Protein 02/08/2022 7.5  6.5 - 8.1 g/dL Final   Albumin 02/08/2022 3.6  3.5 - 5.0 g/dL Final   AST 02/08/2022 22  15 - 41 U/L Final   ALT 02/08/2022 20  0 - 44 U/L Final   Alkaline Phosphatase 02/08/2022 67  38 - 126 U/L Final   Total Bilirubin 02/08/2022 0.1 (L)  0.3 - 1.2 mg/dL Final   GFR, Estimated 02/08/2022 >60  >60 mL/min Final   Comment: (NOTE) Calculated using the CKD-EPI Creatinine Equation (2021)    Anion gap 02/08/2022 10  5 - 15 Final   Performed at [facility] [address] [place], [place] [address]   Troponin I (High Sensitivity) 02/08/2022 5  <18 ng/L Final   Comment: (NOTE) Elevated high sensitivity troponin I (hsTnI) values and significant  changes across serial measurements may suggest ACS but many other  chronic and acute conditions are known to elevate hsTnI results.  Refer to the "Links" section for chest pain algorithms and additional  guidance. Performed at [facility], [place] [address], [place], [place] [address]    WBC 02/09/2022 7.3  4.0 - 10.5 K/uL Final   RBC 02/09/2022 4.70  3.87 - 5.11 MIL/uL  Final   Hemoglobin 02/09/2022 11.4 (L)  12.0 - 15.0 g/dL Final   HCT 02/09/2022 36.6  36.0 - 46.0 % Final   MCV 02/09/2022 77.9 (L)  80.0 - 100.0 fL Final   MCH 02/09/2022 24.3 (L)  26.0 - 34.0 pg Final   MCHC 02/09/2022 31.1  30.0 - 36.0 g/dL Final   RDW 02/09/2022 16.6 (H)  11.5 - 15.5 % Final   Platelets 02/09/2022 403 (H)  150 - 400 K/uL Final   nRBC 02/09/2022 0.0  0.0 - 0.2 % Final   Performed at [facility] [address], [place], [place] [address]  Glucose-Capillary 02/08/2022 117 (H)  70 - 99 mg/dL Final   Glucose reference range applies only to samples taken after fasting for at least 8 hours.   Sodium 02/09/2022 138  135 - 145 mmol/L Final   Potassium 02/09/2022 3.7  3.5 - 5.1 mmol/L Final   Chloride 02/09/2022 103  98 - 111 mmol/L Final   CO2 02/09/2022 24  22 - 32 mmol/L Final   Glucose, Bld 02/09/2022 134 (H)  70 - 99 mg/dL Final   Glucose reference range applies only to samples taken after fasting for at least 8 hours.   BUN 02/09/2022 9  6 - 20 mg/dL Final   Creatinine, Ser 02/09/2022 0.44  0.44 - 1.00 mg/dL Final   Calcium 02/09/2022 8.3 (L)  8.9 - 10.3 mg/dL Final   GFR, Estimated 02/09/2022 >60  >60 mL/min Final   Comment: (NOTE) Calculated using the CKD-EPI Creatinine Equation (2021)    Anion gap 02/09/2022 11  5 - 15 Final   Performed at [facility], [place] [address] [place], [place] [address]   Iron 02/09/2022 20 (L)  28 - 170 ug/dL Final   TIBC 02/09/2022 455 (H)  250 - 450 ug/dL Final   Saturation Ratios 02/09/2022 4 (L)  10.4 - 31.8 % Final   UIBC 02/09/2022 435  ug/dL Final   Performed at [facility], [place] [address] [place], [place] [address]   Ferritin 02/09/2022 2 (L)  11 - 307 ng/mL Final   Performed at [facility] [address], [place], [place] [address]   Weight 02/09/2022 2,846.58  oz Final   Height 02/09/2022 62  in Final   BP 02/09/2022 143/80  mmHg Final   WBC 02/09/2022 8.1  4.0 - 10.5 K/uL Final   RBC  02/09/2022 4.38  3.87 - 5.11 MIL/uL Final   Hemoglobin 02/09/2022 10.9 (L)  12.0 - 15.0 g/dL Final   HCT 02/09/2022 33.3 (L)  36.0 - 46.0 % Final   MCV 02/09/2022 76.0 (L)  80.0 - 100.0 fL Final   MCH 02/09/2022 24.9 (L)  26.0 - 34.0 pg Final   MCHC 02/09/2022 32.7  30.0 - 36.0 g/dL Final   RDW 02/09/2022 16.5 (H)  11.5 - 15.5 % Final   Platelets 02/09/2022 397  150 - 400 K/uL Final   nRBC 02/09/2022 0.0  0.0 - 0.2 % Final   Neutrophils Relative % 02/09/2022 66  % Final   Neutro Abs 02/09/2022 5.4  1.7 - 7.7 K/uL Final   Lymphocytes Relative 02/09/2022 24  % Final   Lymphs Abs 02/09/2022 1.9  0.7 - 4.0 K/uL Final   Monocytes Relative 02/09/2022 8  % Final   Monocytes Absolute 02/09/2022 0.6  0.1 - 1.0 K/uL Final   Eosinophils Relative 02/09/2022 1  % Final   Eosinophils Absolute 02/09/2022 0.1  0.0 - 0.5 K/uL Final   Basophils Relative 02/09/2022 1  % Final   Basophils Absolute 02/09/2022 0.1  0.0 - 0.1 K/uL Final   Immature Granulocytes 02/09/2022 0  % Final   Abs Immature Granulocytes 02/09/2022 0.03  0.00 - 0.07 K/uL Final   Performed at [facility], [place] [address], [place], [place] [address]   Glucose-Capillary 02/09/2022 238 (H)  70 - 99 mg/dL Final   Glucose reference range applies only to samples taken after fasting for at least 8 hours.   Glucose-Capillary 02/09/2022 91  70 - 99 mg/dL Final   Glucose reference range applies  only to samples taken after fasting for at least 8 hours.   CRP 02/09/2022 <0.5  <1.0 mg/dL Final   Performed at [facility] [address] [place], [place] [address]   Sed Rate 02/09/2022 15  0 - 22 mm/hr Final   Performed at [facility] [address] [place], [place] [address]   Glucose-Capillary 02/09/2022 137 (H)  70 - 99 mg/dL Final   Glucose reference range applies only to samples taken after fasting for at least 8 hours.  Admission on 01/24/2022, Discharged on 02/08/2022  Component Date Value Ref Range Status    Glucose-Capillary 01/24/2022 143 (H)  70 - 99 mg/dL Final   Glucose reference range applies only to samples taken after fasting for at least 8 hours.   Glucose-Capillary 01/24/2022 120 (H)  70 - 99 mg/dL Final   Glucose reference range applies only to samples taken after fasting for at least 8 hours.   Glucose-Capillary 01/25/2022 122 (H)  70 - 99 mg/dL Final   Glucose reference range applies only to samples taken after fasting for at least 8 hours.   Glucose-Capillary 01/25/2022 190 (H)  70 - 99 mg/dL Final   Glucose reference range applies only to samples taken after fasting for at least 8 hours.   Glucose-Capillary 01/25/2022 163 (H)  70 - 99 mg/dL Final   Glucose reference range applies only to samples taken after fasting for at least 8 hours.   WBC 01/26/2022 6.5  4.0 - 10.5 K/uL Final   RBC 01/26/2022 4.53  3.87 - 5.11 MIL/uL Final   Hemoglobin 01/26/2022 11.3 (L)  12.0 - 15.0 g/dL Final   HCT 01/26/2022 36.4  36.0 - 46.0 % Final   MCV 01/26/2022 80.4  80.0 - 100.0 fL Final   MCH 01/26/2022 24.9 (L)  26.0 - 34.0 pg Final   MCHC 01/26/2022 31.0  30.0 - 36.0 g/dL Final   RDW 01/26/2022 17.3 (H)  11.5 - 15.5 % Final   Platelets 01/26/2022 327  150 - 400 K/uL Final   nRBC 01/26/2022 0.0  0.0 - 0.2 % Final   Neutrophils Relative % 01/26/2022 60  % Final   Neutro Abs 01/26/2022 3.9  1.7 - 7.7 K/uL Final   Lymphocytes Relative 01/26/2022 31  % Final   Lymphs Abs 01/26/2022 2.0  0.7 - 4.0 K/uL Final   Monocytes Relative 01/26/2022 7  % Final   Monocytes Absolute 01/26/2022 0.5  0.1 - 1.0 K/uL Final   Eosinophils Relative 01/26/2022 1  % Final   Eosinophils Absolute 01/26/2022 0.1  0.0 - 0.5 K/uL Final   Basophils Relative 01/26/2022 1  % Final   Basophils Absolute 01/26/2022 0.1  0.0 - 0.1 K/uL Final   Immature Granulocytes 01/26/2022 0  % Final   Abs Immature Granulocytes 01/26/2022 0.02  0.00 - 0.07 K/uL Final   Performed at [facility], [place] [address] [place], [place]  [address]   Glucose-Capillary 01/26/2022 149 (H)  70 - 99 mg/dL Final   Glucose reference range applies only to samples taken after fasting for at least 8 hours.   Glucose-Capillary 01/26/2022 130 (H)  70 - 99 mg/dL Final   Glucose reference range applies only to samples taken after fasting for at least 8 hours.   Glucose-Capillary 01/26/2022 151 (H)  70 - 99 mg/dL Final   Glucose reference range applies only to samples taken after fasting for at least 8 hours.   Glucose-Capillary 01/26/2022 170 (  H)  70 - 99 mg/dL Final   Glucose reference range applies only to samples taken after fasting for at least 8 hours.   Glucose-Capillary 01/27/2022 129 (H)  70 - 99 mg/dL Final   Glucose reference range applies only to samples taken after fasting for at least 8 hours.   Glucose-Capillary 01/27/2022 101 (H)  70 - 99 mg/dL Final   Glucose reference range applies only to samples taken after fasting for at least 8 hours.   Glucose-Capillary 01/27/2022 220 (H)  70 - 99 mg/dL Final   Glucose reference range applies only to samples taken after fasting for at least 8 hours.   Glucose-Capillary 01/27/2022 154 (H)  70 - 99 mg/dL Final   Glucose reference range applies only to samples taken after fasting for at least 8 hours.   Glucose-Capillary 01/27/2022 163 (H)  70 - 99 mg/dL Final   Glucose reference range applies only to samples taken after fasting for at least 8 hours.   Glucose-Capillary 01/28/2022 127 (H)  70 - 99 mg/dL Final   Glucose reference range applies only to samples taken after fasting for at least 8 hours.   Glucose-Capillary 01/28/2022 110 (H)  70 - 99 mg/dL Final   Glucose reference range applies only to samples taken after fasting for at least 8 hours.   Glucose-Capillary 01/28/2022 167 (H)  70 - 99 mg/dL Final   Glucose reference range applies only to samples taken after fasting for at least 8 hours.   Glucose-Capillary 01/29/2022 145 (H)  70 - 99 mg/dL Final   Glucose reference range applies  only to samples taken after fasting for at least 8 hours.   Glucose-Capillary 01/29/2022 203 (H)  70 - 99 mg/dL Final   Glucose reference range applies only to samples taken after fasting for at least 8 hours.   Glucose-Capillary 01/29/2022 153 (H)  70 - 99 mg/dL Final   Glucose reference range applies only to samples taken after fasting for at least 8 hours.   Glucose-Capillary 01/29/2022 166 (H)  70 - 99 mg/dL Final   Glucose reference range applies only to samples taken after fasting for at least 8 hours.   Glucose-Capillary 01/30/2022 142 (H)  70 - 99 mg/dL Final   Glucose reference range applies only to samples taken after fasting for at least 8 hours.   Glucose-Capillary 01/30/2022 138 (H)  70 - 99 mg/dL Final   Glucose reference range applies only to samples taken after fasting for at least 8 hours.   Glucose-Capillary 01/30/2022 185 (H)  70 - 99 mg/dL Final   Glucose reference range applies only to samples taken after fasting for at least 8 hours.   Glucose-Capillary 01/30/2022 220 (H)  70 - 99 mg/dL Final   Glucose reference range applies only to samples taken after fasting for at least 8 hours.   Glucose-Capillary 01/31/2022 147 (H)  70 - 99 mg/dL Final   Glucose reference range applies only to samples taken after fasting for at least 8 hours.   Glucose-Capillary 01/31/2022 131 (H)  70 - 99 mg/dL Final   Glucose reference range applies only to samples taken after fasting for at least 8 hours.   Glucose-Capillary 01/31/2022 169 (H)  70 - 99 mg/dL Final   Glucose reference range applies only to samples taken after fasting for at least 8 hours.   Glucose-Capillary 01/31/2022 144 (H)  70 - 99 mg/dL Final   Glucose reference range applies only to samples taken after fasting  for at least 8 hours.   Comment 1 01/31/2022 RN AWARE   Final   Glucose-Capillary 02/01/2022 117 (H)  70 - 99 mg/dL Final   Glucose reference range applies only to samples taken after fasting for at least 8 hours.    Glucose-Capillary 02/01/2022 180 (H)  70 - 99 mg/dL Final   Glucose reference range applies only to samples taken after fasting for at least 8 hours.   Glucose-Capillary 02/01/2022 209 (H)  70 - 99 mg/dL Final   Glucose reference range applies only to samples taken after fasting for at least 8 hours.   Glucose-Capillary 02/02/2022 131 (H)  70 - 99 mg/dL Final   Glucose reference range applies only to samples taken after fasting for at least 8 hours.   WBC 02/02/2022 7.5  4.0 - 10.5 K/uL Final   RBC 02/02/2022 4.52  3.87 - 5.11 MIL/uL Final   Hemoglobin 02/02/2022 11.3 (L)  12.0 - 15.0 g/dL Final   HCT 02/02/2022 35.0 (L)  36.0 - 46.0 % Final   MCV 02/02/2022 77.4 (L)  80.0 - 100.0 fL Final   MCH 02/02/2022 25.0 (L)  26.0 - 34.0 pg Final   MCHC 02/02/2022 32.3  30.0 - 36.0 g/dL Final   RDW 02/02/2022 16.7 (H)  11.5 - 15.5 % Final   Platelets 02/02/2022 345  150 - 400 K/uL Final   nRBC 02/02/2022 0.0  0.0 - 0.2 % Final   Neutrophils Relative % 02/02/2022 63  % Final   Neutro Abs 02/02/2022 4.7  1.7 - 7.7 K/uL Final   Lymphocytes Relative 02/02/2022 28  % Final   Lymphs Abs 02/02/2022 2.1  0.7 - 4.0 K/uL Final   Monocytes Relative 02/02/2022 7  % Final   Monocytes Absolute 02/02/2022 0.5  0.1 - 1.0 K/uL Final   Eosinophils Relative 02/02/2022 1  % Final   Eosinophils Absolute 02/02/2022 0.1  0.0 - 0.5 K/uL Final   Basophils Relative 02/02/2022 1  % Final   Basophils Absolute 02/02/2022 0.1  0.0 - 0.1 K/uL Final   Immature Granulocytes 02/02/2022 0  % Final   Abs Immature Granulocytes 02/02/2022 0.03  0.00 - 0.07 K/uL Final   Performed at [facility], [place] [address], [place], [place] [address]   Glucose-Capillary 02/02/2022 95  70 - 99 mg/dL Final   Glucose reference range applies only to samples taken after fasting for at least 8 hours.   Glucose-Capillary 02/02/2022 120 (H)  70 - 99 mg/dL Final   Glucose reference range applies only to samples taken after fasting for at least 8  hours.   Glucose-Capillary 02/02/2022 191 (H)  70 - 99 mg/dL Final   Glucose reference range applies only to samples taken after fasting for at least 8 hours.   Glucose-Capillary 02/03/2022 148 (H)  70 - 99 mg/dL Final   Glucose reference range applies only to samples taken after fasting for at least 8 hours.   Glucose-Capillary 02/03/2022 122 (H)  70 - 99 mg/dL Final   Glucose reference range applies only to samples taken after fasting for at least 8 hours.   Glucose-Capillary 02/03/2022 233 (H)  70 - 99 mg/dL Final   Glucose reference range applies only to samples taken after fasting for at least 8 hours.   Glucose-Capillary 02/04/2022 126 (H)  70 - 99 mg/dL Final   Glucose reference range applies only to samples taken after fasting for at least 8 hours.   Glucose-Capillary 02/04/2022 117 (H)  70 - 99 mg/dL Final   Glucose reference range applies only to samples taken after fasting for at least 8 hours.   Glucose-Capillary 02/04/2022 135 (H)  70 - 99 mg/dL Final   Glucose reference range applies only to samples taken after fasting for at least 8 hours.   Glucose-Capillary 02/04/2022 197 (H)  70 - 99 mg/dL Final   Glucose reference range applies only to samples taken after fasting for at least 8 hours.   Glucose-Capillary 02/05/2022 170 (H)  70 - 99 mg/dL Final   Glucose reference range applies only to samples taken after fasting for at least 8 hours.   Glucose-Capillary 02/05/2022 107 (H)  70 - 99 mg/dL Final   Glucose reference range applies only to samples taken after fasting for at least 8 hours.   Glucose-Capillary 02/05/2022 184 (H)  70 - 99 mg/dL Final   Glucose reference range applies only to samples taken after fasting for at least 8 hours.   Glucose-Capillary 02/05/2022 256 (H)  70 - 99 mg/dL Final   Glucose reference range applies only to samples taken after fasting for at least 8 hours.   Glucose-Capillary 02/06/2022 144 (H)  70 - 99 mg/dL Final   Glucose reference range  applies only to samples taken after fasting for at least 8 hours.   Glucose-Capillary 02/06/2022 190 (H)  70 - 99 mg/dL Final   Glucose reference range applies only to samples taken after fasting for at least 8 hours.   Glucose-Capillary 02/06/2022 92  70 - 99 mg/dL Final   Glucose reference range applies only to samples taken after fasting for at least 8 hours.   Trichomonas 02/06/2022 Negative   Final   Bacterial Vaginitis-Urine 02/06/2022 Negative   Final   Molecular Comment 02/06/2022 For tests bacteria and/or candida, this specimen does not meet the   Final   Molecular Comment 02/06/2022 strict criteria set by the FDA. The result interpretation should be   Final   Molecular Comment 02/06/2022 considered in conjunction with the patient's clinical history.   Final   Comment 02/06/2022 Normal Reference Range Trichomonas - Negative   Final   Glucose-Capillary 02/06/2022 197 (H)  70 - 99 mg/dL Final   Glucose reference range applies only to samples taken after fasting for at least 8 hours.   Glucose-Capillary 02/07/2022 129 (H)  70 - 99 mg/dL Final   Glucose reference range applies only to samples taken after fasting for at least 8 hours.   Glucose-Capillary 02/07/2022 207 (H)  70 - 99 mg/dL Final   Glucose reference range applies only to samples taken after fasting for at least 8 hours.   Glucose-Capillary 02/07/2022 153 (H)  70 - 99 mg/dL Final   Glucose reference range applies only to samples taken after fasting for at least 8 hours.   Glucose-Capillary 02/08/2022 129 (H)  70 - 99 mg/dL Final   Glucose reference range applies only to samples taken after fasting for at least 8 hours.   Glucose-Capillary 02/08/2022 107 (H)  70 - 99 mg/dL Final   Glucose reference range applies only to samples taken after fasting for at least 8 hours.  Admission on 01/23/2022, Discharged on 01/24/2022  Component Date Value Ref Range Status   Sodium 01/23/2022 140  135 - 145 mmol/L Final   Potassium  01/23/2022 4.4  3.5 - 5.1 mmol/L Final   Chloride 01/23/2022 101  98 - 111 mmol/L Final   CO2 01/23/2022 25  22 - 32 mmol/L Final  Glucose, Bld 01/23/2022 198 (H)  70 - 99 mg/dL Final   Glucose reference range applies only to samples taken after fasting for at least 8 hours.   BUN 01/23/2022 13  6 - 20 mg/dL Final   Creatinine, Ser 01/23/2022 0.62  0.44 - 1.00 mg/dL Final   Calcium 01/23/2022 9.3  8.9 - 10.3 mg/dL Final   GFR, Estimated 01/23/2022 >60  >60 mL/min Final   Comment: (NOTE) Calculated using the CKD-EPI Creatinine Equation (2021)    Anion gap 01/23/2022 14  5 - 15 Final   Performed at [facility], [place] [address], [place], [place] [address]   WBC 01/23/2022 5.8  4.0 - 10.5 K/uL Final   RBC 01/23/2022 4.63  3.87 - 5.11 MIL/uL Final   Hemoglobin 01/23/2022 11.7 (L)  12.0 - 15.0 g/dL Final   HCT 01/23/2022 37.4  36.0 - 46.0 % Final   MCV 01/23/2022 80.8  80.0 - 100.0 fL Final   MCH 01/23/2022 25.3 (L)  26.0 - 34.0 pg Final   MCHC 01/23/2022 31.3  30.0 - 36.0 g/dL Final   RDW 01/23/2022 17.9 (H)  11.5 - 15.5 % Final   Platelets 01/23/2022 333  150 - 400 K/uL Final   nRBC 01/23/2022 0.0  0.0 - 0.2 % Final   Performed at [facility] [address], [place], [place] [address]   Troponin I (High Sensitivity) 01/23/2022 6  <18 ng/L Final   Comment: (NOTE) Elevated high sensitivity troponin I (hsTnI) values and significant  changes across serial measurements may suggest ACS but many other  chronic and acute conditions are known to elevate hsTnI results.  Refer to the "Links" section for chest pain algorithms and additional  guidance. Performed at [facility], [place] [address], [place], [place] [address]    Troponin I (High Sensitivity) 01/23/2022 5  <18 ng/L Final   Comment: (NOTE) Elevated high sensitivity troponin I (hsTnI) values and significant  changes across serial measurements may suggest ACS but many other  chronic and acute conditions are known to  elevate hsTnI results.  Refer to the "Links" section for chest pain algorithms and additional  guidance. Performed at [facility], [place] [address], [place], [place] [address]    SARS Coronavirus 2 by RT PCR 01/23/2022 NEGATIVE  NEGATIVE Final   Comment: (NOTE) SARS-CoV-2 target nucleic acids are NOT DETECTED.  The SARS-CoV-2 RNA is generally detectable in upper and lower respiratory specimens during the acute phase of infection. The lowest concentration of SARS-CoV-2 viral copies this assay can detect is 250 copies / mL. A negative result does not preclude SARS-CoV-2 infection and should not be used as the sole basis for treatment or other patient management decisions.  A negative result may occur with improper specimen collection / handling, submission of specimen other than nasopharyngeal swab, presence of viral mutation(s) within the areas targeted by this assay, and inadequate number of viral copies (<250 copies / mL). A negative result must be combined with clinical observations, patient history, and epidemiological information.  Fact Sheet for Patients:   https://www.patel.info/  Fact Sheet for Healthcare Providers: https://hall.com/  This test is not yet approved or                           cleared by the [place] FDA and has been authorized for detection and/or diagnosis of SARS-CoV-2 by FDA under an Emergency Use Authorization (EUA).  This EUA will remain in effect (meaning this test can be used) for the duration of the COVID-19 declaration under Section 564(b)(1) of the Act, 21 U.S.C. section 360bbb-3(b)(1), unless the authorization is terminated or revoked sooner.  Performed at [facility], [place] [address], [place], [place] [address]    B Natriuretic Peptide 01/23/2022 41.3  0.0 - 100.0 pg/mL Final   Performed at [facility] [address], [place], [place] [address]   Group A Strep by PCR  01/23/2022 NOT DETECTED  NOT DETECTED Final   Performed at [facility], [place] [address], [place], [place] [address]   Color, Urine 01/23/2022 YELLOW  YELLOW Final   APPearance 01/23/2022 CLEAR  CLEAR Final   Specific Gravity, Urine 01/23/2022 1.018  1.005 - 1.030 Final   pH 01/23/2022 7.0  5.0 - 8.0 Final   Glucose, UA 01/23/2022 NEGATIVE  NEGATIVE mg/dL Final   Hgb urine dipstick 01/23/2022 NEGATIVE  NEGATIVE Final   Bilirubin Urine 01/23/2022 NEGATIVE  NEGATIVE Final   Ketones, ur 01/23/2022 NEGATIVE  NEGATIVE mg/dL Final   Protein, ur 01/23/2022 NEGATIVE  NEGATIVE mg/dL Final   Nitrite 01/23/2022 NEGATIVE  NEGATIVE Final   Leukocytes,Ua 01/23/2022 TRACE (A)  NEGATIVE Final   RBC / HPF 01/23/2022 0-5  0 - 5 RBC/hpf Final   WBC, UA 01/23/2022 0-5  0 - 5 WBC/hpf Final   Bacteria, UA 01/23/2022 NONE SEEN  NONE SEEN Final   Squamous Epithelial / LPF 01/23/2022 0-5  0 - 5 Final   Mucus 01/23/2022 PRESENT   Final   Performed at [facility], [place] [address], [place], [place] [address]   SARS Coronavirus 2 by RT PCR 01/23/2022 NEGATIVE  NEGATIVE Final   Comment: (NOTE) SARS-CoV-2 target nucleic acids are NOT DETECTED.  The SARS-CoV-2 RNA is generally detectable in upper respiratory specimens during the acute phase of infection. The lowest concentration of SARS-CoV-2 viral copies this assay can detect is 138 copies/mL. A negative result does not preclude SARS-Cov-2 infection and should not be used as the sole basis for treatment or other patient management decisions. A negative result may occur with  improper specimen collection/handling, submission of specimen other than nasopharyngeal swab, presence of viral mutation(s) within the areas targeted by this assay, and inadequate number of viral copies(<138 copies/mL). A negative result must be combined with clinical observations, patient history, and epidemiological information. The expected result is Negative.  Fact Sheet for  Patients:  EntrepreneurPulse.com.au  Fact Sheet for Healthcare Providers:  IncredibleEmployment.be  This test is no                          t yet approved or cleared by the [place] FDA and  has been authorized for detection and/or diagnosis of SARS-CoV-2 by FDA under an Emergency Use Authorization (EUA). This EUA will remain  in effect (meaning this test can be used) for the duration of the COVID-19 declaration under Section 564(b)(1) of the Act, 21 U.S.C.section 360bbb-3(b)(1), unless the authorization is terminated  or revoked sooner.       Influenza A by PCR 01/23/2022 NEGATIVE  NEGATIVE Final   Influenza B by PCR 01/23/2022 NEGATIVE  NEGATIVE Final   Comment: (NOTE) The Xpert Xpress SARS-CoV-2/FLU/RSV plus assay is intended as an aid in the diagnosis of influenza from Nasopharyngeal swab specimens and should not be used as a sole basis for treatment. Nasal washings and aspirates are unacceptable  for Xpert Xpress SARS-CoV-2/FLU/RSV testing.  Fact Sheet for Patients: EntrepreneurPulse.com.au  Fact Sheet for Healthcare Providers: IncredibleEmployment.be  This test is not yet approved or cleared by the [place] FDA and has been authorized for detection and/or diagnosis of SARS-CoV-2 by FDA under an Emergency Use Authorization (EUA). This EUA will remain in effect (meaning this test can be used) for the duration of the COVID-19 declaration under Section 564(b)(1) of the Act, 21 U.S.C. section 360bbb-3(b)(1), unless the authorization is terminated or revoked.  Performed at [facility], [place] [address], [place], [place] [address]    WBC 01/24/2022 6.1  4.0 - 10.5 K/uL Final   RBC 01/24/2022 4.60  3.87 - 5.11 MIL/uL Final   Hemoglobin 01/24/2022 11.7 (L)  12.0 - 15.0 g/dL Final   HCT 01/24/2022 37.0  36.0 - 46.0 % Final   MCV 01/24/2022 80.4  80.0 - 100.0 fL Final   MCH 01/24/2022 25.4  (L)  26.0 - 34.0 pg Final   MCHC 01/24/2022 31.6  30.0 - 36.0 g/dL Final   RDW 01/24/2022 17.6 (H)  11.5 - 15.5 % Final   Platelets 01/24/2022 334  150 - 400 K/uL Final   nRBC 01/24/2022 0.0  0.0 - 0.2 % Final   Neutrophils Relative % 01/24/2022 53  % Final   Neutro Abs 01/24/2022 3.3  1.7 - 7.7 K/uL Final   Lymphocytes Relative 01/24/2022 33  % Final   Lymphs Abs 01/24/2022 2.0  0.7 - 4.0 K/uL Final   Monocytes Relative 01/24/2022 11  % Final   Monocytes Absolute 01/24/2022 0.7  0.1 - 1.0 K/uL Final   Eosinophils Relative 01/24/2022 2  % Final   Eosinophils Absolute 01/24/2022 0.1  0.0 - 0.5 K/uL Final   Basophils Relative 01/24/2022 1  % Final   Basophils Absolute 01/24/2022 0.1  0.0 - 0.1 K/uL Final   Immature Granulocytes 01/24/2022 0  % Final   Abs Immature Granulocytes 01/24/2022 0.02  0.00 - 0.07 K/uL Final   Performed at [facility], [place] [address], [place], [place] [address]   Glucose-Capillary 01/24/2022 110 (H)  70 - 99 mg/dL Final   Glucose reference range applies only to samples taken after fasting for at least 8 hours.  No results displayed because visit has over 200 results.    Admission on 11/22/2021, Discharged on 11/22/2021  Component Date Value Ref Range Status   Glucose-Capillary 11/22/2021 169 (H)  70 - 99 mg/dL Final   Glucose reference range applies only to samples taken after fasting for at least 8 hours.  No results displayed because visit has over 200 results.    Admission on 10/04/2021, Discharged on 10/22/2021  Component Date Value Ref Range Status   SARS Coronavirus 2 by RT PCR 10/04/2021 NEGATIVE  NEGATIVE Final   Comment: (NOTE) SARS-CoV-2 target nucleic acids are NOT DETECTED.  The SARS-CoV-2 RNA is generally detectable in upper respiratory specimens during the acute phase of infection. The lowest concentration of SARS-CoV-2 viral copies this assay can detect is 138 copies/mL. A negative result does not preclude SARS-Cov-2 infection and  should not be used as the sole basis for treatment or other patient management decisions. A negative result may occur with  improper specimen collection/handling, submission of specimen other than nasopharyngeal swab, presence of viral mutation(s) within the areas targeted by this assay, and inadequate number of viral copies(<138 copies/mL). A negative result must be combined with clinical observations, patient history, and epidemiological information. The expected result  is Negative.  Fact Sheet for Patients:  EntrepreneurPulse.com.au  Fact Sheet for Healthcare Providers:  IncredibleEmployment.be  This test is no                          t yet approved or cleared by the [place] FDA and  has been authorized for detection and/or diagnosis of SARS-CoV-2 by FDA under an Emergency Use Authorization (EUA). This EUA will remain  in effect (meaning this test can be used) for the duration of the COVID-19 declaration under Section 564(b)(1) of the Act, 21 U.S.C.section 360bbb-3(b)(1), unless the authorization is terminated  or revoked sooner.       Influenza A by PCR 10/04/2021 NEGATIVE  NEGATIVE Final   Influenza B by PCR 10/04/2021 NEGATIVE  NEGATIVE Final   Comment: (NOTE) The Xpert Xpress SARS-CoV-2/FLU/RSV plus assay is intended as an aid in the diagnosis of influenza from Nasopharyngeal swab specimens and should not be used as a sole basis for treatment. Nasal washings and aspirates are unacceptable for Xpert Xpress SARS-CoV-2/FLU/RSV testing.  Fact Sheet for Patients: EntrepreneurPulse.com.au  Fact Sheet for Healthcare Providers: IncredibleEmployment.be  This test is not yet approved or cleared by the [place] FDA and has been authorized for detection and/or diagnosis of SARS-CoV-2 by FDA under an Emergency Use Authorization (EUA). This EUA will remain in effect (meaning this test can be used)  for the duration of the COVID-19 declaration under Section 564(b)(1) of the Act, 21 U.S.C. section 360bbb-3(b)(1), unless the authorization is terminated or revoked.  Performed at [facility], [place] [address], [place], [place] [address]    WBC 10/04/2021 8.4  4.0 - 10.5 K/uL Final   RBC 10/04/2021 4.42  3.87 - 5.11 MIL/uL Final   Hemoglobin 10/04/2021 11.6 (L)  12.0 - 15.0 g/dL Final   HCT 10/04/2021 35.7 (L)  36.0 - 46.0 % Final   MCV 10/04/2021 80.8  80.0 - 100.0 fL Final   MCH 10/04/2021 26.2  26.0 - 34.0 pg Final   MCHC 10/04/2021 32.5  30.0 - 36.0 g/dL Final   RDW 10/04/2021 16.0 (H)  11.5 - 15.5 % Final   Platelets 10/04/2021 372  150 - 400 K/uL Final   nRBC 10/04/2021 0.0  0.0 - 0.2 % Final   Neutrophils Relative % 10/04/2021 68  % Final   Neutro Abs 10/04/2021 5.7  1.7 - 7.7 K/uL Final   Lymphocytes Relative 10/04/2021 22  % Final   Lymphs Abs 10/04/2021 1.8  0.7 - 4.0 K/uL Final   Monocytes Relative 10/04/2021 8  % Final   Monocytes Absolute 10/04/2021 0.7  0.1 - 1.0 K/uL Final   Eosinophils Relative 10/04/2021 1  % Final   Eosinophils Absolute 10/04/2021 0.1  0.0 - 0.5 K/uL Final   Basophils Relative 10/04/2021 1  % Final   Basophils Absolute 10/04/2021 0.1  0.0 - 0.1 K/uL Final   Immature Granulocytes 10/04/2021 0  % Final   Abs Immature Granulocytes 10/04/2021 0.03  0.00 - 0.07 K/uL Final   Performed at [facility], [place] [address], [place], [place] [address]   Sodium 10/04/2021 136  135 - 145 mmol/L Final   Potassium 10/04/2021 4.2  3.5 - 5.1 mmol/L Final   Chloride 10/04/2021 104  98 - 111 mmol/L Final   CO2 10/04/2021 25  22 - 32 mmol/L Final   Glucose, Bld 10/04/2021 100 (H)  70 - 99 mg/dL Final   Glucose reference  range applies only to samples taken after fasting for at least 8 hours.   BUN 10/04/2021 9  6 - 20 mg/dL Final   Creatinine, Ser 10/04/2021 0.52  0.44 - 1.00 mg/dL Final   Calcium 10/04/2021 9.0  8.9 - 10.3 mg/dL Final   Total Protein  10/04/2021 7.0  6.5 - 8.1 g/dL Final   Albumin 10/04/2021 3.2 (L)  3.5 - 5.0 g/dL Final   AST 10/04/2021 13 (L)  15 - 41 U/L Final   ALT 10/04/2021 10  0 - 44 U/L Final   Alkaline Phosphatase 10/04/2021 61  38 - 126 U/L Final   Total Bilirubin 10/04/2021 0.3  0.3 - 1.2 mg/dL Final   GFR, Estimated 10/04/2021 >60  >60 mL/min Final   Comment: (NOTE) Calculated using the CKD-EPI Creatinine Equation (2021)    Anion gap 10/04/2021 7  5 - 15 Final   Performed at [facility] [address] [place], [place] [address]   Hgb A1c MFr Bld 10/04/2021 7.0 (H)  4.8 - 5.6 % Final   Comment: (NOTE) Pre diabetes:          5.7%-6.4%  Diabetes:              >6.4%  Glycemic control for   <7.0% adults with diabetes    Mean Plasma Glucose 10/04/2021 154.2  mg/dL Final   Performed at [facility], [place] [address], [place], [place] [address]   Cholesterol 10/04/2021 178  0 - 200 mg/dL Final   Triglycerides 10/04/2021 155 (H)  <150 mg/dL Final   HDL 10/04/2021 52  >40 mg/dL Final   Total CHOL/HDL Ratio 10/04/2021 3.4  RATIO Final   VLDL 10/04/2021 31  0 - 40 mg/dL Final   LDL Cholesterol 10/04/2021 95  0 - 99 mg/dL Final   Comment:        Total Cholesterol/HDL:CHD Risk Coronary Heart Disease Risk Table                     Men   Women  1/2 Average Risk   3.4   3.3  Average Risk       5.0   4.4  2 X Average Risk   9.6   7.1  3 X Average Risk  23.4   11.0        Use the calculated Patient Ratio above and the CHD Risk Table to determine the patient's CHD Risk.        ATP III CLASSIFICATION (LDL):  <100     mg/dL   Optimal  100-129  mg/dL   Near or Above                    Optimal  130-159  mg/dL   Borderline  160-189  mg/dL   High  >190     mg/dL   Very High Performed at [facility] [address], [place], [place] [address]    POC Amphetamine UR 10/04/2021 None Detected  NONE DETECTED (Cut Off Level 1000 ng/mL) Final   POC Secobarbital (BAR) 10/04/2021 None Detected  NONE  DETECTED (Cut Off Level 300 ng/mL) Final   POC Buprenorphine (BUP) 10/04/2021 None Detected  NONE DETECTED (Cut Off Level 10 ng/mL) Final   POC Oxazepam (BZO) 10/04/2021 None Detected  NONE DETECTED (Cut Off Level 300 ng/mL) Final   POC Cocaine UR 10/04/2021 None Detected  NONE DETECTED (Cut Off Level 300 ng/mL) Final  POC Methamphetamine UR 10/04/2021 None Detected  NONE DETECTED (Cut Off Level 1000 ng/mL) Final   POC Morphine 10/04/2021 None Detected  NONE DETECTED (Cut Off Level 300 ng/mL) Final   POC Methadone UR 10/04/2021 None Detected  NONE DETECTED (Cut Off Level 300 ng/mL) Final   POC Oxycodone UR 10/04/2021 Positive (A)  NONE DETECTED (Cut Off Level 100 ng/mL) Final   POC Marijuana UR 10/04/2021 None Detected  NONE DETECTED (Cut Off Level 50 ng/mL) Final   SARSCOV2ONAVIRUS 2 AG 10/04/2021 NEGATIVE  NEGATIVE Final   Comment: (NOTE) SARS-CoV-2 antigen NOT DETECTED.   Negative results are presumptive.  Negative results do not preclude SARS-CoV-2 infection and should not be used as the sole basis for treatment or other patient management decisions, including infection  control decisions, particularly in the presence of clinical signs and  symptoms consistent with COVID-19, or in those who have been in contact with the virus.  Negative results must be combined with clinical observations, patient history, and epidemiological information. The expected result is Negative.  Fact Sheet for Patients: HandmadeRecipes.com.cy  Fact Sheet for Healthcare Providers: FuneralLife.at  This test is not yet approved or cleared by the [place] FDA and  has been authorized for detection and/or diagnosis of SARS-CoV-2 by FDA under an Emergency Use Authorization (EUA).  This EUA will remain in effect (meaning this test can be used) for the duration of  the COV                          ID-19 declaration under Section 564(b)(1) of the Act, 21 U.S.C.  section 360bbb-3(b)(1), unless the authorization is terminated or revoked sooner.     Valproic Acid Lvl 10/04/2021 51  50.0 - 100.0 ug/mL Final   Performed at [facility] [address], [place], [place] [address]   Valproic Acid Lvl 10/08/2021 57  50.0 - 100.0 ug/mL Final   Performed at [facility] [address] [place], [place] [address]   Glucose-Capillary 10/09/2021 123 (H)  70 - 99 mg/dL Final   Glucose reference range applies only to samples taken after fasting for at least 8 hours.  There may be more visits with results that are not included.    Blood Alcohol level:  Lab Results  Component Value Date   ETH <10 11/23/2021   ETH <10 68/12/5724    Metabolic Disorder Labs: Lab Results  Component Value Date   HGBA1C 6.7 (H) 12/28/2021   MPG 145.59 12/28/2021   MPG 154.2 10/04/2021   Lab Results  Component Value Date   PROLACTIN 3.8 (L) 11/23/2021   Lab Results  Component Value Date   CHOL 171 11/23/2021   TRIG 125 11/23/2021   HDL 65 11/23/2021   CHOLHDL 2.6 11/23/2021   VLDL 25 11/23/2021   LDLCALC 81 11/23/2021   LDLCALC 95 10/04/2021    Therapeutic Lab Levels: No results found for: "LITHIUM" Lab Results  Component Value Date   VALPROATE 17 (L) 02/17/2022   VALPROATE 33 (L) 01/18/2022   No results found for: "CBMZ"  Physical Findings   PHQ2-9    Flowsheet Row ED from 11/23/2021 in [facility]  PHQ-2 Total Score 2  PHQ-9 Total Score 3      Flowsheet Row ED from 02/18/2022 in [facility] ED from 02/17/2022 in [facility] ED from 02/09/2022 in [facility]  RISK CATEGORY No Risk No Risk No Risk        Musculoskeletal  Strength & Muscle Tone: within normal limits Gait & Station: normal Patient leans: N/A  Psychiatric Specialty Exam  Presentation  General Appearance:  Appropriate for Environment;  Casual  Eye Contact: Good  Speech: Clear and Coherent; Normal Rate  Speech Volume: Normal  Handedness: Right   Mood and Affect  Mood: Euthymic  Affect: Blunt   Thought Process  Thought Processes: Coherent; Goal Directed; Linear  Descriptions of Associations:Intact  Orientation:Full (Time, Place and Person)  Thought Content:WDL  Diagnosis of Schizophrenia or Schizoaffective disorder in past: Yes  Duration of Psychotic Symptoms: No data recorded  Hallucinations:Hallucinations: None  Ideas of Reference:None  Suicidal Thoughts:Suicidal Thoughts: No  Homicidal Thoughts:Homicidal Thoughts: No   Sensorium  Memory: Immediate Fair  Judgment: Intact  Insight: Present   Executive Functions  Concentration: Fair  Attention Span: Fair  Recall: [employer] of Knowledge: Fair  Language: Fair   Psychomotor Activity  Psychomotor Activity: Psychomotor Activity: Normal   Assets  Assets: Communication Skills; Desire for Improvement; Financial Resources/Insurance; Leisure Time; Resilience   Sleep  Sleep: Sleep: Good   No data recorded  Physical Exam  Physical Exam Constitutional:      General: She is not in acute distress.    Appearance: She is not ill-appearing, toxic-appearing or diaphoretic.  Eyes:     General: No scleral icterus. Cardiovascular:     Rate and Rhythm: Tachycardia present.  Pulmonary:     Effort: Pulmonary effort is normal. No respiratory distress.  Neurological:     Mental Status: She is alert and oriented to person, place, and time.  Psychiatric:        Attention and Perception: Attention and perception normal.        Mood and Affect: Mood normal. Affect is blunt.        Speech: Speech normal.        Behavior: Behavior normal. Behavior is cooperative.        Thought Content: Thought content normal.    Review of Systems  Constitutional:  Negative for chills and fever.  Respiratory:  Negative for shortness of  breath.   Cardiovascular:  Negative for chest pain and palpitations.  Gastrointestinal:  Negative for abdominal pain.  Neurological:  Negative for headaches.   Blood pressure 129/84, pulse (!) 102, temperature 97.9 F (36.6 C), temperature source Oral, resp. rate 18, SpO2 100 %. There is no height or weight on file to calculate BMI.  Treatment Plan Summary: Plan Pt remains psychiatrically cleared. Disposition is pending possible placement at [facility].  Tharon Aquas, NP 02/25/2022 9:26 AM

## 2022-02-25 NOTE — ED Notes (Signed)
Patient is doing well this morning. Patient denies SI/HI and AVH. Patient ate breakfast and is resting at this time. Patient is being monitored for safety.

## 2022-02-25 NOTE — ED Notes (Signed)
Pt sleeping@this time. Breathing even and unlabored. Will continue to monitor for safety 

## 2022-02-25 NOTE — ED Notes (Signed)
Patient has been in a good mood today no complaints noted. Patient has taken her medications and eating her meals. Patient is being monitored for safety by staff.

## 2022-02-25 NOTE — Care Management (Signed)
OBS Care Manager   Writer left a voice mail message with Mason Jim regarding the status of the patients authorization status.

## 2022-02-25 NOTE — ED Notes (Signed)
Pt resting on her bed calm and cooperative. Pt watching a movie. She has no c/o pain or distress. Will continue to monitor for safety

## 2022-02-26 DIAGNOSIS — F209 Schizophrenia, unspecified: Secondary | ICD-10-CM | POA: Diagnosis not present

## 2022-02-26 LAB — GLUCOSE, CAPILLARY
Glucose-Capillary: 133 mg/dL — ABNORMAL HIGH (ref 70–99)
Glucose-Capillary: 85 mg/dL (ref 70–99)
Glucose-Capillary: 136 mg/dL — ABNORMAL HIGH (ref 70–99)
Glucose-Capillary: 170 mg/dL — ABNORMAL HIGH (ref 70–99)
Glucose-Capillary: 185 mg/dL — ABNORMAL HIGH (ref 70–99)

## 2022-02-26 NOTE — ED Notes (Signed)
Pt sleeping@this time. Breathing even and unlabored. Will continue to monitor for safety 

## 2022-02-26 NOTE — ED Notes (Signed)
Pt had bedtime snack. 

## 2022-02-26 NOTE — ED Provider Notes (Signed)
Behavioral Health Progress Note  Date and Time: 02/26/2022 8:31 AM Name: Teresa Murillo MRN:  [id number]  Subjective:   Teresa Murillo is a 61 y.o. female with a past psychiatric history of schizophrenia, aggressive behavior, and possible intellectual disability presenting to [facility] on 11/23/21 voluntarily as a walk in via [facility] with complaints that she was locked out of the group home. Patient has been dismissed from group home due to multiple elopements. Pt had already been discharged from this facility but had been living there temporarily while DSS was looking for new placement. Pt is boarding at [facility].   AID to Capacity Evaluation (ACE) completed by Dr. Louis Meckel, Dr. Dwyane Dee on 12/11/2021.  Please see media tab for full details.    Teresa Harman, PhD completed: Wechsler Adult Intelligence Scale-4, Teresa Murillo achieved a full-scale IQ score of 73 and a percentile rank of 4 placing her in the borderline range of intellectual functioning (12/14/2021) Please see consult note from Teresa Harman, PhD on 12/14/2021.  On reassessment, pt is sitting up, a&ox3, in no acute distress, non toxic appearing. She is conversing appropriately with peer on unit. Pt endorses euthymic mood. She denies any mood disturbances. She denies suicidal, homicidal or violent ideations. She denies auditory visual hallucinations or paranoia. Objectively, there is no evidence of agitation, aggression, distractibility or internal preoccupation. No delusions or paranoia elicited. Pt is calm, cooperative, pleasant. She remains psychiatrically cleared. Disposition is pending placement, possible placement at [facility].  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 On the Wechsler Adult Intelligence Scale-4, Teresa Murillo achieved a full-scale IQ score of 73 and a percentile rank of 4 placing her in the borderline range of intellectual functioning.    Chronic obstructive pulmonary disease (COPD) (HCC)    Glaucoma    Hyperlipidemia    Hypertension    Iron deficiency    Schizoaffective disorder (HCC)    Type 2 diabetes mellitus (HCC)     Past Surgical History:  Procedure Laterality Date   TUBAL LIGATION     Family History:  Family History  Problem Relation Age of Onset   Breast cancer Maternal Grandmother    Family Psychiatric  History: None reported Social History:  Social History   Substance and Sexual Activity  Alcohol Use Not Currently     Social History   Substance and Sexual Activity  Drug Use Not Currently    Social History   Socioeconomic History   Marital status: Divorced    Spouse name: Not on file   Number of children: Not on file   Years of education: Not on file   Highest education level: Not on file  Occupational History   Not on file  Tobacco Use   Smoking status: Former    Packs/day: 1.00    Types: Cigars, Cigarettes    Quit date: 11/09/2021    Years since quitting: 0.2   Smokeless tobacco: Current  Vaping Use   Vaping Use: Never used  Substance and Sexual Activity   Alcohol use: Not Currently   Drug use: Not Currently   Sexual activity: Not Currently    Birth control/protection: Surgical  Other Topics Concern   Not on file  Social History Narrative   Not  on file   Social Determinants of Health   Financial Resource Strain: Not on file  Food Insecurity: Not on file  Transportation Needs: Not on file  Physical Activity: Not on file  Stress: Not on file  Social Connections: Not on file   SDOH:  SDOH Screenings   Depression (PHQ2-9): Low Risk  (11/23/2021)  Tobacco Use: High Risk (02/17/2022)   Additional Social History:                          Sleep: Good  Appetite:  Good  Current Medications:  Current Facility-Administered Medications  Medication Dose Route Frequency Provider Last Rate Last Admin   acetaminophen (TYLENOL) tablet 650 mg  650 mg Oral Q6H PRN [name], NP   650 mg at 02/25/22 2129   albuterol (VENTOLIN HFA) 108 (90 Base) MCG/ACT inhaler 2 puff  2 puff Inhalation Q6H PRN [name], NP   2 puff at 02/20/22 1944   alum & mag hydroxide-simeth (MAALOX/MYLANTA) 200-200-20 MG/5ML suspension 30 mL  30 mL Oral Q4H PRN [name], NP   30 mL at 02/21/22 2051   apixaban (ELIQUIS) tablet 5 mg  5 mg Oral BID [name], NP   5 mg at 02/25/22 2130   ARIPiprazole (ABILIFY) tablet 10 mg  10 mg Oral Daily [name], NP   10 mg at 02/25/22 0959   cloZAPine (CLOZARIL) tablet 75 mg  75 mg Oral BID France Ravens, MD   75 mg at 02/25/22 2129   colchicine tablet 0.6 mg  0.6 mg Oral QHS [name], NP   0.6 mg at 02/25/22 2130   diltiazem (CARDIZEM CD) 24 hr capsule 240 mg  240 mg Oral Daily [name], NP   240 mg at 02/25/22 1000   divalproex (DEPAKOTE ER) 24 hr tablet 500 mg  500 mg Oral BID [name], NP   500 mg at 02/25/22 2129   haloperidol (HALDOL) tablet 10 mg  10 mg Oral TID PRN [name], NP       insulin aspart (novoLOG) injection 0-9 Units  0-9 Units Subcutaneous TID WC France Ravens, MD   2 Units at 02/25/22 1725   insulin glargine-yfgn (SEMGLEE) injection 12 Units  12 Units Subcutaneous Daily [name], NP   12 Units at 02/25/22 1031   latanoprost (XALATAN) 0.005 % ophthalmic solution 1 drop  1 drop Both Eyes QHS [name], NP   1 drop at 02/25/22 2130   magnesium hydroxide (MILK OF MAGNESIA) suspension 30 mL  30 mL Oral Daily PRN [name], NP       menthol-cetylpyridinium (CEPACOL) lozenge 3 mg  1 lozenge Oral Q4H PRN Revonda Humphrey, NP   3 mg at 02/25/22 1932   metFORMIN (GLUCOPHAGE) tablet 500 mg  500 mg Oral BID WC [name], NP   500 mg at 02/25/22 1725    mometasone-formoterol (DULERA) 200-5 MCG/ACT inhaler 2 puff  2 puff Inhalation BID [name], NP   2 puff at 02/25/22 1932   nicotine (NICODERM CQ - dosed in mg/24 hr) patch 7 mg  7 mg Transdermal Daily [name], NP   7 mg at 02/25/22 1000   valbenazine (INGREZZA) capsule 40 mg  40 mg Oral q AM [name], NP   40 mg at 02/26/22 0617   Vitamin D (Ergocalciferol) (DRISDOL) 1.25 MG (50000 UNIT) capsule 50,000 Units  50,000 Units Oral Q Leonie Green, NP   50,000 Units at  02/25/22 0959   Current Outpatient Medications  Medication Sig Dispense Refill   Accu-Chek Softclix Lancets lancets Use as directed up to four times daily 100 each 0   apixaban (ELIQUIS) 5 MG TABS tablet Take 1 tablet (5 mg total) by mouth 2 (two) times daily. 60 tablet 0   ARIPiprazole (ABILIFY) 10 MG tablet Take 1 tablet (10 mg total) by mouth daily. 30 tablet 0   Blood Glucose Monitoring Suppl (ACCU-CHEK GUIDE) w/Device KIT Use as directed up to four times daily 1 kit 0   budesonide-formoterol (SYMBICORT) 160-4.5 MCG/ACT inhaler Inhale 2 puffs into the lungs in the morning and at bedtime.     Cholecalciferol (VITAMIN D3) 1.25 MG (50000 UT) CAPS Take 50,000 Units by mouth every Thursday.     clozapine (CLOZARIL) 50 MG tablet Take 1 tablet (50 mg total) by mouth daily. 30 tablet 0   colchicine 0.6 MG tablet Take 1 tablet (0.6 mg total) by mouth at bedtime. 30 tablet 0   diltiazem (CARDIZEM CD) 240 MG 24 hr capsule Take 1 capsule (240 mg total) by mouth daily. (Patient not taking: Reported on 11/24/2021) 30 capsule 0   divalproex (DEPAKOTE ER) 500 MG 24 hr tablet Take 1 tablet (500 mg total) by mouth 2 (two) times daily. 60 tablet 0   glucose blood test strip Use as directed up to four times daily 50 each 0   haloperidol (HALDOL) 10 MG tablet Take 1 tablet (10 mg total) by mouth 3 (three) times daily as needed for agitation (and psychotic symptoms).     INGREZZA 40 MG capsule Take 1 capsule (40 mg total) by mouth in  the morning. 30 capsule 0   insulin aspart (NOVOLOG) 100 UNIT/ML FlexPen Before each meal 3 times a day, 140-199 - 2 units, 200-250 - 4 units, 251-299 - 6 units,  300-349 - 8 units,  350 or above 10 units. Insulin PEN if approved, provide syringes and needles if needed.Please switch to any approved short acting Insulin if needed. 15 mL 0   insulin glargine (LANTUS) 100 UNIT/ML Solostar Pen Inject 12 Units into the skin daily. 15 mL 0   Insulin Pen Needle 32G X 4 MM MISC Use 4 times a day with insulin, 1 month supply. 100 each 0   latanoprost (XALATAN) 0.005 % ophthalmic solution Place 1 drop into both eyes at bedtime.     metFORMIN (GLUCOPHAGE) 500 MG tablet Take 500 mg by mouth 2 (two) times daily with a meal.     nicotine (NICODERM CQ - DOSED IN MG/24 HR) 7 mg/24hr patch Place 1 patch (7 mg total) onto the skin daily. 28 patch 0   PROAIR HFA 108 (90 Base) MCG/ACT inhaler Inhale 2 puffs into the lungs every 6 (six) hours as needed for wheezing or shortness of breath.      Labs  Lab Results:  Admission on 02/18/2022  Component Date Value Ref Range Status   Glucose-Capillary 02/18/2022 254 (H)  70 - 99 mg/dL Final   Glucose reference range applies only to samples taken after fasting for at least 8 hours.   Glucose-Capillary 02/18/2022 112 (H)  70 - 99 mg/dL Final   Glucose reference range applies only to samples taken after fasting for at least 8 hours.   Glucose-Capillary 02/19/2022 159 (H)  70 - 99 mg/dL Final   Glucose reference range applies only to samples taken after fasting for at least 8 hours.   Glucose-Capillary 02/19/2022 160 (H)  70 - 99  mg/dL Final   Glucose reference range applies only to samples taken after fasting for at least 8 hours.   Glucose-Capillary 02/19/2022 177 (H)  70 - 99 mg/dL Final   Glucose reference range applies only to samples taken after fasting for at least 8 hours.   Glucose-Capillary 02/19/2022 147 (H)  70 - 99 mg/dL Final   Glucose reference range  applies only to samples taken after fasting for at least 8 hours.   Glucose-Capillary 02/20/2022 126 (H)  70 - 99 mg/dL Final   Glucose reference range applies only to samples taken after fasting for at least 8 hours.   Glucose-Capillary 02/20/2022 132 (H)  70 - 99 mg/dL Final   Glucose reference range applies only to samples taken after fasting for at least 8 hours.   Glucose-Capillary 02/20/2022 125 (H)  70 - 99 mg/dL Final   Glucose reference range applies only to samples taken after fasting for at least 8 hours.   Glucose-Capillary 02/21/2022 151 (H)  70 - 99 mg/dL Final   Glucose reference range applies only to samples taken after fasting for at least 8 hours.   Glucose-Capillary 02/21/2022 106 (H)  70 - 99 mg/dL Final   Glucose reference range applies only to samples taken after fasting for at least 8 hours.   Glucose-Capillary 02/21/2022 149 (H)  70 - 99 mg/dL Final   Glucose reference range applies only to samples taken after fasting for at least 8 hours.   Glucose-Capillary 02/22/2022 131 (H)  70 - 99 mg/dL Final   Glucose reference range applies only to samples taken after fasting for at least 8 hours.   Glucose-Capillary 02/22/2022 141 (H)  70 - 99 mg/dL Final   Glucose reference range applies only to samples taken after fasting for at least 8 hours.   Glucose-Capillary 02/22/2022 183 (H)  70 - 99 mg/dL Final   Glucose reference range applies only to samples taken after fasting for at least 8 hours.   Glucose-Capillary 02/23/2022 133 (H)  70 - 99 mg/dL Final   Glucose reference range applies only to samples taken after fasting for at least 8 hours.   WBC 02/23/2022 4.8  4.0 - 10.5 K/uL Final   RBC 02/23/2022 3.96  3.87 - 5.11 MIL/uL Final   Hemoglobin 02/23/2022 9.7 (L)  12.0 - 15.0 g/dL Final   HCT 02/23/2022 30.6 (L)  36.0 - 46.0 % Final   MCV 02/23/2022 77.3 (L)  80.0 - 100.0 fL Final   MCH 02/23/2022 24.5 (L)  26.0 - 34.0 pg Final   MCHC 02/23/2022 31.7  30.0 - 36.0 g/dL  Final   RDW 02/23/2022 16.5 (H)  11.5 - 15.5 % Final   Platelets 02/23/2022 372  150 - 400 K/uL Final   nRBC 02/23/2022 0.0  0.0 - 0.2 % Final   Neutrophils Relative % 02/23/2022 56  % Final   Neutro Abs 02/23/2022 2.7  1.7 - 7.7 K/uL Final   Lymphocytes Relative 02/23/2022 32  % Final   Lymphs Abs 02/23/2022 1.5  0.7 - 4.0 K/uL Final   Monocytes Relative 02/23/2022 9  % Final   Monocytes Absolute 02/23/2022 0.4  0.1 - 1.0 K/uL Final   Eosinophils Relative 02/23/2022 2  % Final   Eosinophils Absolute 02/23/2022 0.1  0.0 - 0.5 K/uL Final   Basophils Relative 02/23/2022 1  % Final   Basophils Absolute 02/23/2022 0.1  0.0 - 0.1 K/uL Final   Immature Granulocytes 02/23/2022 0  % Final  Abs Immature Granulocytes 02/23/2022 0.01  0.00 - 0.07 K/uL Final   Performed at [facility], [place] [address], [place],  [address]   Glucose-Capillary 02/23/2022 202 (H)  70 - 99 mg/dL Final   Glucose reference range applies only to samples taken after fasting for at least 8 hours.   Glucose-Capillary 02/23/2022 143 (H)  70 - 99 mg/dL Final   Glucose reference range applies only to samples taken after fasting for at least 8 hours.   Glucose-Capillary 02/23/2022 143 (H)  70 - 99 mg/dL Final   Glucose reference range applies only to samples taken after fasting for at least 8 hours.   Glucose-Capillary 02/24/2022 207 (H)  70 - 99 mg/dL Final   Glucose reference range applies only to samples taken after fasting for at least 8 hours.   Glucose-Capillary 02/24/2022 109 (H)  70 - 99 mg/dL Final   Glucose reference range applies only to samples taken after fasting for at least 8 hours.   Glucose-Capillary 02/24/2022 127 (H)  70 - 99 mg/dL Final   Glucose reference range applies only to samples taken after fasting for at least 8 hours.   Glucose-Capillary 02/24/2022 130 (H)  70 - 99 mg/dL Final   Glucose reference range applies only to samples taken after fasting for at least 8 hours.    Glucose-Capillary 02/25/2022 117 (H)  70 - 99 mg/dL Final   Glucose reference range applies only to samples taken after fasting for at least 8 hours.   Glucose-Capillary 02/25/2022 104 (H)  70 - 99 mg/dL Final   Glucose reference range applies only to samples taken after fasting for at least 8 hours.   Glucose-Capillary 02/25/2022 180 (H)  70 - 99 mg/dL Final   Glucose reference range applies only to samples taken after fasting for at least 8 hours.   Glucose-Capillary 02/25/2022 180 (H)  70 - 99 mg/dL Final   Glucose reference range applies only to samples taken after fasting for at least 8 hours.   Glucose-Capillary 02/26/2022 133 (H)  70 - 99 mg/dL Final   Glucose reference range applies only to samples taken after fasting for at least 8 hours.  Admission on 02/17/2022, Discharged on 02/18/2022  Component Date Value Ref Range Status   Sodium 02/17/2022 136  135 - 145 mmol/L Final   Potassium 02/17/2022 3.9  3.5 - 5.1 mmol/L Final   Chloride 02/17/2022 101  98 - 111 mmol/L Final   CO2 02/17/2022 25  22 - 32 mmol/L Final   Glucose, Bld 02/17/2022 113 (H)  70 - 99 mg/dL Final   Glucose reference range applies only to samples taken after fasting for at least 8 hours.   BUN 02/17/2022 9  6 - 20 mg/dL Final   Creatinine, Ser 02/17/2022 0.50  0.44 - 1.00 mg/dL Final   Calcium 02/17/2022 8.9  8.9 - 10.3 mg/dL Final   GFR, Estimated 02/17/2022 >60  >60 mL/min Final   Comment: (NOTE) Calculated using the CKD-EPI Creatinine Equation (2021)    Anion gap 02/17/2022 10  5 - 15 Final   Performed at [facility], [place] [address], [place], [place] [address]   WBC 02/17/2022 6.5  4.0 - 10.5 K/uL Final   RBC 02/17/2022 4.24  3.87 - 5.11 MIL/uL Final   Hemoglobin 02/17/2022 10.2 (L)  12.0 - 15.0 g/dL Final   HCT 02/17/2022 33.2 (L)  36.0 - 46.0 % Final   MCV 02/17/2022 78.3 (L)  80.0 -  100.0 fL Final   MCH 02/17/2022 24.1 (L)  26.0 - 34.0 pg Final   MCHC 02/17/2022 30.7  30.0 - 36.0 g/dL Final    RDW 02/17/2022 17.2 (H)  11.5 - 15.5 % Final   Platelets 02/17/2022 390  150 - 400 K/uL Final   nRBC 02/17/2022 0.0  0.0 - 0.2 % Final   Performed at [facility], [place] [address], [place], [place] [address]   Troponin I (High Sensitivity) 02/17/2022 4  <18 ng/L Final   Comment: (NOTE) Elevated high sensitivity troponin I (hsTnI) values and significant  changes across serial measurements may suggest ACS but many other  chronic and acute conditions are known to elevate hsTnI results.  Refer to the "Links" section for chest pain algorithms and additional  guidance. Performed at [facility], [place] [address], [place], [place] [address]    Valproic Acid Lvl 02/17/2022 17 (L)  50.0 - 100.0 ug/mL Final   Performed at [place] [address], [place], [place] [address]   B Natriuretic Peptide 02/17/2022 30.9  0.0 - 100.0 pg/mL Final   Performed at [place] [address], [place], [place] [address]   Troponin I (High Sensitivity) 02/18/2022 4  <18 ng/L Final   Comment: (NOTE) Elevated high sensitivity troponin I (hsTnI) values and significant  changes across serial measurements may suggest ACS but many other  chronic and acute conditions are known to elevate hsTnI results.  Refer to the "Links" section for chest pain algorithms and additional  guidance. Performed at [facility], [place] [address], [place], [place] [address]    D-Dimer, Quant 02/18/2022 0.29  0.00 - 0.50 ug/mL-FEU Final   Comment: (NOTE) At the manufacturer cut-off value of 0.5 g/mL FEU, this assay has a negative predictive value of 95-100%.This assay is intended for use in conjunction with a clinical pretest probability (PTP) assessment model to exclude pulmonary embolism (PE) and deep venous thrombosis (DVT) in outpatients suspected of PE or DVT. Results should be correlated with clinical presentation. Performed at [facility], [place] [address], [place], [place] [address]    Admission on 02/09/2022, Discharged on 02/17/2022  Component Date Value Ref Range Status   Glucose-Capillary 02/09/2022 118 (H)  70 - 99 mg/dL Final   Glucose reference range applies only to samples taken after fasting for at least 8 hours.   Glucose-Capillary 02/10/2022 148 (H)  70 - 99 mg/dL Final   Glucose reference range applies only to samples taken after fasting for at least 8 hours.   Glucose-Capillary 02/10/2022 174 (H)  70 - 99 mg/dL Final   Glucose reference range applies only to samples taken after fasting for at least 8 hours.   Glucose-Capillary 02/10/2022 128 (H)  70 - 99 mg/dL Final   Glucose reference range applies only to samples taken after fasting for at least 8 hours.   Glucose-Capillary 02/10/2022 182 (H)  70 - 99 mg/dL Final   Glucose reference range applies only to samples taken after fasting for at least 8 hours.   Glucose-Capillary 02/11/2022 158 (H)  70 - 99 mg/dL Final   Glucose reference range applies only to samples taken after fasting for at least 8 hours.   Glucose-Capillary 02/11/2022 159 (H)  70 - 99 mg/dL Final   Glucose reference range applies only to samples taken after fasting for at least 8 hours.   Glucose-Capillary 02/11/2022 153 (H)  70 - 99 mg/dL Final  Glucose reference range applies only to samples taken after fasting for at least 8 hours.   Glucose-Capillary 02/11/2022 159 (H)  70 - 99 mg/dL Final   Glucose reference range applies only to samples taken after fasting for at least 8 hours.   Glucose-Capillary 02/11/2022 153 (H)  70 - 99 mg/dL Final   Glucose reference range applies only to samples taken after fasting for at least 8 hours.   Glucose-Capillary 02/11/2022 109 (H)  70 - 99 mg/dL Final   Glucose reference range applies only to samples taken after fasting for at least 8 hours.   Glucose-Capillary 02/11/2022 213 (H)  70 - 99 mg/dL Final   Glucose reference range applies only to samples taken after fasting for at least 8 hours.    Glucose-Capillary 02/11/2022 229 (H)  70 - 99 mg/dL Final   Glucose reference range applies only to samples taken after fasting for at least 8 hours.   Glucose-Capillary 02/12/2022 128 (H)  70 - 99 mg/dL Final   Glucose reference range applies only to samples taken after fasting for at least 8 hours.   Glucose-Capillary 02/12/2022 149 (H)  70 - 99 mg/dL Final   Glucose reference range applies only to samples taken after fasting for at least 8 hours.   Color, Urine 02/12/2022 YELLOW  YELLOW Final   APPearance 02/12/2022 CLEAR  CLEAR Final   Specific Gravity, Urine 02/12/2022 1.015  1.005 - 1.030 Final   pH 02/12/2022 5.0  5.0 - 8.0 Final   Glucose, UA 02/12/2022 NEGATIVE  NEGATIVE mg/dL Final   Hgb urine dipstick 02/12/2022 NEGATIVE  NEGATIVE Final   Bilirubin Urine 02/12/2022 NEGATIVE  NEGATIVE Final   Ketones, ur 02/12/2022 NEGATIVE  NEGATIVE mg/dL Final   Protein, ur 02/12/2022 NEGATIVE  NEGATIVE mg/dL Final   Nitrite 02/12/2022 NEGATIVE  NEGATIVE Final   Leukocytes,Ua 02/12/2022 NEGATIVE  NEGATIVE Final   Performed at [facility], [place] [address] [place], [place] [address]   Specimen Description 02/12/2022 URINE, CLEAN CATCH   Final   Special Requests 02/12/2022    Final                   Value:NONE Performed at [facility], [place] [address], [place], [place] [address]    Culture 02/12/2022 10,000 COLONIES/mL PROTEUS MIRABILIS (A)   Final   Report Status 02/12/2022 02/15/2022 FINAL   Final   Organism ID, Bacteria 02/12/2022 PROTEUS MIRABILIS (A)   Final   Glucose-Capillary 02/12/2022 128 (H)  70 - 99 mg/dL Final   Glucose reference range applies only to samples taken after fasting for at least 8 hours.   Glucose-Capillary 02/12/2022 196 (H)  70 - 99 mg/dL Final   Glucose reference range applies only to samples taken after fasting for at least 8 hours.   Glucose-Capillary 02/13/2022 168 (H)  70 - 99 mg/dL Final   Glucose reference range applies only to samples taken after  fasting for at least 8 hours.   Glucose-Capillary 02/13/2022 153 (H)  70 - 99 mg/dL Final   Glucose reference range applies only to samples taken after fasting for at least 8 hours.   Glucose-Capillary 02/13/2022 134 (H)  70 - 99 mg/dL Final   Glucose reference range applies only to samples taken after fasting for at least 8 hours.   Glucose-Capillary 02/13/2022 178 (H)  70 - 99 mg/dL Final   Glucose reference range applies only to samples taken after fasting for at least 8 hours.  Glucose-Capillary 02/14/2022 156 (H)  70 - 99 mg/dL Final   Glucose reference range applies only to samples taken after fasting for at least 8 hours.   Glucose-Capillary 02/14/2022 169 (H)  70 - 99 mg/dL Final   Glucose reference range applies only to samples taken after fasting for at least 8 hours.   Glucose-Capillary 02/14/2022 156 (H)  70 - 99 mg/dL Final   Glucose reference range applies only to samples taken after fasting for at least 8 hours.   Glucose-Capillary 02/14/2022 148 (H)  70 - 99 mg/dL Final   Glucose reference range applies only to samples taken after fasting for at least 8 hours.   Glucose-Capillary 02/15/2022 159 (H)  70 - 99 mg/dL Final   Glucose reference range applies only to samples taken after fasting for at least 8 hours.   Glucose-Capillary 02/15/2022 125 (H)  70 - 99 mg/dL Final   Glucose reference range applies only to samples taken after fasting for at least 8 hours.   Glucose-Capillary 02/15/2022 142 (H)  70 - 99 mg/dL Final   Glucose reference range applies only to samples taken after fasting for at least 8 hours.   Glucose-Capillary 02/15/2022 128 (H)  70 - 99 mg/dL Final   Glucose reference range applies only to samples taken after fasting for at least 8 hours.   Glucose-Capillary 02/16/2022 137 (H)  70 - 99 mg/dL Final   Glucose reference range applies only to samples taken after fasting for at least 8 hours.   WBC 02/16/2022 6.0  4.0 - 10.5 K/uL Final   RBC 02/16/2022 4.48   3.87 - 5.11 MIL/uL Final   Hemoglobin 02/16/2022 10.7 (L)  12.0 - 15.0 g/dL Final   HCT 02/16/2022 35.4 (L)  36.0 - 46.0 % Final   MCV 02/16/2022 79.0 (L)  80.0 - 100.0 fL Final   MCH 02/16/2022 23.9 (L)  26.0 - 34.0 pg Final   MCHC 02/16/2022 30.2  30.0 - 36.0 g/dL Final   RDW 02/16/2022 17.0 (H)  11.5 - 15.5 % Final   Platelets 02/16/2022 387  150 - 400 K/uL Final   nRBC 02/16/2022 0.0  0.0 - 0.2 % Final   Neutrophils Relative % 02/16/2022 54  % Final   Neutro Abs 02/16/2022 3.2  1.7 - 7.7 K/uL Final   Lymphocytes Relative 02/16/2022 35  % Final   Lymphs Abs 02/16/2022 2.1  0.7 - 4.0 K/uL Final   Monocytes Relative 02/16/2022 6  % Final   Monocytes Absolute 02/16/2022 0.4  0.1 - 1.0 K/uL Final   Eosinophils Relative 02/16/2022 2  % Final   Eosinophils Absolute 02/16/2022 0.1  0.0 - 0.5 K/uL Final   Basophils Relative 02/16/2022 2  % Final   Basophils Absolute 02/16/2022 0.1  0.0 - 0.1 K/uL Final   Immature Granulocytes 02/16/2022 1  % Final   Abs Immature Granulocytes 02/16/2022 0.04  0.00 - 0.07 K/uL Final   Performed at [facility], [place] [address] [place], [place] [address]   Glucose-Capillary 02/16/2022 131 (H)  70 - 99 mg/dL Final   Glucose reference range applies only to samples taken after fasting for at least 8 hours.   Glucose-Capillary 02/16/2022 123 (H)  70 - 99 mg/dL Final   Glucose reference range applies only to samples taken after fasting for at least 8 hours.   Glucose-Capillary 02/16/2022 195 (H)  70 - 99 mg/dL Final   Glucose reference range applies only to samples taken after fasting  for at least 8 hours.   Glucose-Capillary 02/17/2022 148 (H)  70 - 99 mg/dL Final   Glucose reference range applies only to samples taken after fasting for at least 8 hours.  Admission on 02/08/2022, Discharged on 02/09/2022  Component Date Value Ref Range Status   B Natriuretic Peptide 02/08/2022 40.2  0.0 - 100.0 pg/mL Final   Performed at [facility], [address], [place], [place] [address]   Troponin I (High Sensitivity) 02/08/2022 4  <18 ng/L Final   Comment: (NOTE) Elevated high sensitivity troponin I (hsTnI) values and significant  changes across serial measurements may suggest ACS but many other  chronic and acute conditions are known to elevate hsTnI results.  Refer to the "Links" section for chest pain algorithms and additional  guidance. Performed at [facility], [place] [address], [place], [place] [address]    WBC 02/08/2022 8.5  4.0 - 10.5 K/uL Final   RBC 02/08/2022 4.30  3.87 - 5.11 MIL/uL Final   Hemoglobin 02/08/2022 10.7 (L)  12.0 - 15.0 g/dL Final   HCT 02/08/2022 33.1 (L)  36.0 - 46.0 % Final   MCV 02/08/2022 77.0 (L)  80.0 - 100.0 fL Final   MCH 02/08/2022 24.9 (L)  26.0 - 34.0 pg Final   MCHC 02/08/2022 32.3  30.0 - 36.0 g/dL Final   RDW 02/08/2022 16.5 (H)  11.5 - 15.5 % Final   Platelets 02/08/2022 361  150 - 400 K/uL Final   nRBC 02/08/2022 0.0  0.0 - 0.2 % Final   Performed at [place] [address], [place], [place] [address]   Sodium 02/08/2022 131 (L)  135 - 145 mmol/L Final   Potassium 02/08/2022 4.4  3.5 - 5.1 mmol/L Final   Chloride 02/08/2022 96 (L)  98 - 111 mmol/L Final   CO2 02/08/2022 25  22 - 32 mmol/L Final   Glucose, Bld 02/08/2022 126 (H)  70 - 99 mg/dL Final   Glucose reference range applies only to samples taken after fasting for at least 8 hours.   BUN 02/08/2022 11  6 - 20 mg/dL Final   Creatinine, Ser 02/08/2022 0.52  0.44 - 1.00 mg/dL Final   Calcium 02/08/2022 9.4  8.9 - 10.3 mg/dL Final   Total Protein 02/08/2022 7.5  6.5 - 8.1 g/dL Final   Albumin 02/08/2022 3.6  3.5 - 5.0 g/dL Final   AST 02/08/2022 22  15 - 41 U/L Final   ALT 02/08/2022 20  0 - 44 U/L Final   Alkaline Phosphatase 02/08/2022 67  38 - 126 U/L Final   Total Bilirubin 02/08/2022 0.1 (L)  0.3 - 1.2 mg/dL Final   GFR, Estimated 02/08/2022 >60  >60 mL/min Final   Comment: (NOTE) Calculated using the CKD-EPI  Creatinine Equation (2021)    Anion gap 02/08/2022 10  5 - 15 Final   Performed at [place] [address] [place],  [address]   Troponin I (High Sensitivity) 02/08/2022 5  <18 ng/L Final   Comment: (NOTE) Elevated high sensitivity troponin I (hsTnI) values and significant  changes across serial measurements may suggest ACS but many other  chronic and acute conditions are known to elevate hsTnI results.  Refer to the "Links" section for chest pain algorithms and additional  guidance. Performed at [facility], [place] [address], [place],  [address]    WBC 02/09/2022 7.3  4.0 - 10.5 K/uL Final   RBC  02/09/2022 4.70  3.87 - 5.11 MIL/uL Final   Hemoglobin 02/09/2022 11.4 (L)  12.0 - 15.0 g/dL Final   HCT 02/09/2022 36.6  36.0 - 46.0 % Final   MCV 02/09/2022 77.9 (L)  80.0 - 100.0 fL Final   MCH 02/09/2022 24.3 (L)  26.0 - 34.0 pg Final   MCHC 02/09/2022 31.1  30.0 - 36.0 g/dL Final   RDW 02/09/2022 16.6 (H)  11.5 - 15.5 % Final   Platelets 02/09/2022 403 (H)  150 - 400 K/uL Final   nRBC 02/09/2022 0.0  0.0 - 0.2 % Final   Performed at [facility], [place] [address], [place], [place] [address]   Glucose-Capillary 02/08/2022 117 (H)  70 - 99 mg/dL Final   Glucose reference range applies only to samples taken after fasting for at least 8 hours.   Sodium 02/09/2022 138  135 - 145 mmol/L Final   Potassium 02/09/2022 3.7  3.5 - 5.1 mmol/L Final   Chloride 02/09/2022 103  98 - 111 mmol/L Final   CO2 02/09/2022 24  22 - 32 mmol/L Final   Glucose, Bld 02/09/2022 134 (H)  70 - 99 mg/dL Final   Glucose reference range applies only to samples taken after fasting for at least 8 hours.   BUN 02/09/2022 9  6 - 20 mg/dL Final   Creatinine, Ser 02/09/2022 0.44  0.44 - 1.00 mg/dL Final   Calcium 02/09/2022 8.3 (L)  8.9 - 10.3 mg/dL Final   GFR, Estimated 02/09/2022 >60  >60 mL/min Final   Comment: (NOTE) Calculated using the CKD-EPI Creatinine Equation (2021)     Anion gap 02/09/2022 11  5 - 15 Final   Performed at [facility], [place] [address] [place], [place] [address]   Iron 02/09/2022 20 (L)  28 - 170 ug/dL Final   TIBC 02/09/2022 455 (H)  250 - 450 ug/dL Final   Saturation Ratios 02/09/2022 4 (L)  10.4 - 31.8 % Final   UIBC 02/09/2022 435  ug/dL Final   Performed at [facility], [place] [address] [place], [place] [address]   Ferritin 02/09/2022 2 (L)  11 - 307 ng/mL Final   Performed at [place] [address], [place], [place] [address]   Weight 02/09/2022 2,846.58  oz Final   Height 02/09/2022 62  in Final   BP 02/09/2022 143/80  mmHg Final   WBC 02/09/2022 8.1  4.0 - 10.5 K/uL Final   RBC 02/09/2022 4.38  3.87 - 5.11 MIL/uL Final   Hemoglobin 02/09/2022 10.9 (L)  12.0 - 15.0 g/dL Final   HCT 02/09/2022 33.3 (L)  36.0 - 46.0 % Final   MCV 02/09/2022 76.0 (L)  80.0 - 100.0 fL Final   MCH 02/09/2022 24.9 (L)  26.0 - 34.0 pg Final   MCHC 02/09/2022 32.7  30.0 - 36.0 g/dL Final   RDW 02/09/2022 16.5 (H)  11.5 - 15.5 % Final   Platelets 02/09/2022 397  150 - 400 K/uL Final   nRBC 02/09/2022 0.0  0.0 - 0.2 % Final   Neutrophils Relative % 02/09/2022 66  % Final   Neutro Abs 02/09/2022 5.4  1.7 - 7.7 K/uL Final   Lymphocytes Relative 02/09/2022 24  % Final   Lymphs Abs 02/09/2022 1.9  0.7 - 4.0 K/uL Final   Monocytes Relative 02/09/2022 8  % Final   Monocytes Absolute 02/09/2022 0.6  0.1 - 1.0 K/uL Final   Eosinophils Relative 02/09/2022 1  %  Final   Eosinophils Absolute 02/09/2022 0.1  0.0 - 0.5 K/uL Final   Basophils Relative 02/09/2022 1  % Final   Basophils Absolute 02/09/2022 0.1  0.0 - 0.1 K/uL Final   Immature Granulocytes 02/09/2022 0  % Final   Abs Immature Granulocytes 02/09/2022 0.03  0.00 - 0.07 K/uL Final   Performed at [facility], [place] [address] [place], [place] [address]   Glucose-Capillary 02/09/2022 238 (H)  70 - 99 mg/dL Final   Glucose reference range applies only to samples taken after  fasting for at least 8 hours.   Glucose-Capillary 02/09/2022 91  70 - 99 mg/dL Final   Glucose reference range applies only to samples taken after fasting for at least 8 hours.   CRP 02/09/2022 <0.5  <1.0 mg/dL Final   Performed at [place] [address] [place], [place] [address]   Sed Rate 02/09/2022 15  0 - 22 mm/hr Final   Performed at [facility] [address] [place], [place] [address]   Glucose-Capillary 02/09/2022 137 (H)  70 - 99 mg/dL Final   Glucose reference range applies only to samples taken after fasting for at least 8 hours.  Admission on 01/24/2022, Discharged on 02/08/2022  Component Date Value Ref Range Status   Glucose-Capillary 01/24/2022 143 (H)  70 - 99 mg/dL Final   Glucose reference range applies only to samples taken after fasting for at least 8 hours.   Glucose-Capillary 01/24/2022 120 (H)  70 - 99 mg/dL Final   Glucose reference range applies only to samples taken after fasting for at least 8 hours.   Glucose-Capillary 01/25/2022 122 (H)  70 - 99 mg/dL Final   Glucose reference range applies only to samples taken after fasting for at least 8 hours.   Glucose-Capillary 01/25/2022 190 (H)  70 - 99 mg/dL Final   Glucose reference range applies only to samples taken after fasting for at least 8 hours.   Glucose-Capillary 01/25/2022 163 (H)  70 - 99 mg/dL Final   Glucose reference range applies only to samples taken after fasting for at least 8 hours.   WBC 01/26/2022 6.5  4.0 - 10.5 K/uL Final   RBC 01/26/2022 4.53  3.87 - 5.11 MIL/uL Final   Hemoglobin 01/26/2022 11.3 (L)  12.0 - 15.0 g/dL Final   HCT 01/26/2022 36.4  36.0 - 46.0 % Final   MCV 01/26/2022 80.4  80.0 - 100.0 fL Final   MCH 01/26/2022 24.9 (L)  26.0 - 34.0 pg Final   MCHC 01/26/2022 31.0  30.0 - 36.0 g/dL Final   RDW 01/26/2022 17.3 (H)  11.5 - 15.5 % Final   Platelets 01/26/2022 327  150 - 400 K/uL Final   nRBC 01/26/2022 0.0  0.0 - 0.2 % Final   Neutrophils Relative %  01/26/2022 60  % Final   Neutro Abs 01/26/2022 3.9  1.7 - 7.7 K/uL Final   Lymphocytes Relative 01/26/2022 31  % Final   Lymphs Abs 01/26/2022 2.0  0.7 - 4.0 K/uL Final   Monocytes Relative 01/26/2022 7  % Final   Monocytes Absolute 01/26/2022 0.5  0.1 - 1.0 K/uL Final   Eosinophils Relative 01/26/2022 1  % Final   Eosinophils Absolute 01/26/2022 0.1  0.0 - 0.5 K/uL Final   Basophils Relative 01/26/2022 1  % Final   Basophils Absolute 01/26/2022 0.1  0.0 - 0.1 K/uL Final   Immature Granulocytes 01/26/2022 0  % Final   Abs  Immature Granulocytes 01/26/2022 0.02  0.00 - 0.07 K/uL Final   Performed at [facility], [place] [address] [place], [place] [address]   Glucose-Capillary 01/26/2022 149 (H)  70 - 99 mg/dL Final   Glucose reference range applies only to samples taken after fasting for at least 8 hours.   Glucose-Capillary 01/26/2022 130 (H)  70 - 99 mg/dL Final   Glucose reference range applies only to samples taken after fasting for at least 8 hours.   Glucose-Capillary 01/26/2022 151 (H)  70 - 99 mg/dL Final   Glucose reference range applies only to samples taken after fasting for at least 8 hours.   Glucose-Capillary 01/26/2022 170 (H)  70 - 99 mg/dL Final   Glucose reference range applies only to samples taken after fasting for at least 8 hours.   Glucose-Capillary 01/27/2022 129 (H)  70 - 99 mg/dL Final   Glucose reference range applies only to samples taken after fasting for at least 8 hours.   Glucose-Capillary 01/27/2022 101 (H)  70 - 99 mg/dL Final   Glucose reference range applies only to samples taken after fasting for at least 8 hours.   Glucose-Capillary 01/27/2022 220 (H)  70 - 99 mg/dL Final   Glucose reference range applies only to samples taken after fasting for at least 8 hours.   Glucose-Capillary 01/27/2022 154 (H)  70 - 99 mg/dL Final   Glucose reference range applies only to samples taken after fasting for at least 8 hours.   Glucose-Capillary 01/27/2022 163  (H)  70 - 99 mg/dL Final   Glucose reference range applies only to samples taken after fasting for at least 8 hours.   Glucose-Capillary 01/28/2022 127 (H)  70 - 99 mg/dL Final   Glucose reference range applies only to samples taken after fasting for at least 8 hours.   Glucose-Capillary 01/28/2022 110 (H)  70 - 99 mg/dL Final   Glucose reference range applies only to samples taken after fasting for at least 8 hours.   Glucose-Capillary 01/28/2022 167 (H)  70 - 99 mg/dL Final   Glucose reference range applies only to samples taken after fasting for at least 8 hours.   Glucose-Capillary 01/29/2022 145 (H)  70 - 99 mg/dL Final   Glucose reference range applies only to samples taken after fasting for at least 8 hours.   Glucose-Capillary 01/29/2022 203 (H)  70 - 99 mg/dL Final   Glucose reference range applies only to samples taken after fasting for at least 8 hours.   Glucose-Capillary 01/29/2022 153 (H)  70 - 99 mg/dL Final   Glucose reference range applies only to samples taken after fasting for at least 8 hours.   Glucose-Capillary 01/29/2022 166 (H)  70 - 99 mg/dL Final   Glucose reference range applies only to samples taken after fasting for at least 8 hours.   Glucose-Capillary 01/30/2022 142 (H)  70 - 99 mg/dL Final   Glucose reference range applies only to samples taken after fasting for at least 8 hours.   Glucose-Capillary 01/30/2022 138 (H)  70 - 99 mg/dL Final   Glucose reference range applies only to samples taken after fasting for at least 8 hours.   Glucose-Capillary 01/30/2022 185 (H)  70 - 99 mg/dL Final   Glucose reference range applies only to samples taken after fasting for at least 8 hours.   Glucose-Capillary 01/30/2022 220 (H)  70 - 99 mg/dL Final   Glucose reference range applies only to samples taken after  fasting for at least 8 hours.   Glucose-Capillary 01/31/2022 147 (H)  70 - 99 mg/dL Final   Glucose reference range applies only to samples taken after fasting for  at least 8 hours.   Glucose-Capillary 01/31/2022 131 (H)  70 - 99 mg/dL Final   Glucose reference range applies only to samples taken after fasting for at least 8 hours.   Glucose-Capillary 01/31/2022 169 (H)  70 - 99 mg/dL Final   Glucose reference range applies only to samples taken after fasting for at least 8 hours.   Glucose-Capillary 01/31/2022 144 (H)  70 - 99 mg/dL Final   Glucose reference range applies only to samples taken after fasting for at least 8 hours.   Comment 1 01/31/2022 RN AWARE   Final   Glucose-Capillary 02/01/2022 117 (H)  70 - 99 mg/dL Final   Glucose reference range applies only to samples taken after fasting for at least 8 hours.   Glucose-Capillary 02/01/2022 180 (H)  70 - 99 mg/dL Final   Glucose reference range applies only to samples taken after fasting for at least 8 hours.   Glucose-Capillary 02/01/2022 209 (H)  70 - 99 mg/dL Final   Glucose reference range applies only to samples taken after fasting for at least 8 hours.   Glucose-Capillary 02/02/2022 131 (H)  70 - 99 mg/dL Final   Glucose reference range applies only to samples taken after fasting for at least 8 hours.   WBC 02/02/2022 7.5  4.0 - 10.5 K/uL Final   RBC 02/02/2022 4.52  3.87 - 5.11 MIL/uL Final   Hemoglobin 02/02/2022 11.3 (L)  12.0 - 15.0 g/dL Final   HCT 02/02/2022 35.0 (L)  36.0 - 46.0 % Final   MCV 02/02/2022 77.4 (L)  80.0 - 100.0 fL Final   MCH 02/02/2022 25.0 (L)  26.0 - 34.0 pg Final   MCHC 02/02/2022 32.3  30.0 - 36.0 g/dL Final   RDW 02/02/2022 16.7 (H)  11.5 - 15.5 % Final   Platelets 02/02/2022 345  150 - 400 K/uL Final   nRBC 02/02/2022 0.0  0.0 - 0.2 % Final   Neutrophils Relative % 02/02/2022 63  % Final   Neutro Abs 02/02/2022 4.7  1.7 - 7.7 K/uL Final   Lymphocytes Relative 02/02/2022 28  % Final   Lymphs Abs 02/02/2022 2.1  0.7 - 4.0 K/uL Final   Monocytes Relative 02/02/2022 7  % Final   Monocytes Absolute 02/02/2022 0.5  0.1 - 1.0 K/uL Final   Eosinophils Relative  02/02/2022 1  % Final   Eosinophils Absolute 02/02/2022 0.1  0.0 - 0.5 K/uL Final   Basophils Relative 02/02/2022 1  % Final   Basophils Absolute 02/02/2022 0.1  0.0 - 0.1 K/uL Final   Immature Granulocytes 02/02/2022 0  % Final   Abs Immature Granulocytes 02/02/2022 0.03  0.00 - 0.07 K/uL Final   Performed at [facility], [place] [address] [place], [place] [address]   Glucose-Capillary 02/02/2022 95  70 - 99 mg/dL Final   Glucose reference range applies only to samples taken after fasting for at least 8 hours.   Glucose-Capillary 02/02/2022 120 (H)  70 - 99 mg/dL Final   Glucose reference range applies only to samples taken after fasting for at least 8 hours.   Glucose-Capillary 02/02/2022 191 (H)  70 - 99 mg/dL Final   Glucose reference range applies only to samples taken after fasting for at least 8 hours.   Glucose-Capillary 02/03/2022 148 (H)  70 - 99 mg/dL Final   Glucose reference range applies only to samples taken after fasting for at least 8 hours.   Glucose-Capillary 02/03/2022 122 (H)  70 - 99 mg/dL Final   Glucose reference range applies only to samples taken after fasting for at least 8 hours.   Glucose-Capillary 02/03/2022 233 (H)  70 - 99 mg/dL Final   Glucose reference range applies only to samples taken after fasting for at least 8 hours.   Glucose-Capillary 02/04/2022 126 (H)  70 - 99 mg/dL Final   Glucose reference range applies only to samples taken after fasting for at least 8 hours.   Glucose-Capillary 02/04/2022 117 (H)  70 - 99 mg/dL Final   Glucose reference range applies only to samples taken after fasting for at least 8 hours.   Glucose-Capillary 02/04/2022 135 (H)  70 - 99 mg/dL Final   Glucose reference range applies only to samples taken after fasting for at least 8 hours.   Glucose-Capillary 02/04/2022 197 (H)  70 - 99 mg/dL Final   Glucose reference range applies only to samples taken after fasting for at least 8 hours.   Glucose-Capillary 02/05/2022  170 (H)  70 - 99 mg/dL Final   Glucose reference range applies only to samples taken after fasting for at least 8 hours.   Glucose-Capillary 02/05/2022 107 (H)  70 - 99 mg/dL Final   Glucose reference range applies only to samples taken after fasting for at least 8 hours.   Glucose-Capillary 02/05/2022 184 (H)  70 - 99 mg/dL Final   Glucose reference range applies only to samples taken after fasting for at least 8 hours.   Glucose-Capillary 02/05/2022 256 (H)  70 - 99 mg/dL Final   Glucose reference range applies only to samples taken after fasting for at least 8 hours.   Glucose-Capillary 02/06/2022 144 (H)  70 - 99 mg/dL Final   Glucose reference range applies only to samples taken after fasting for at least 8 hours.   Glucose-Capillary 02/06/2022 190 (H)  70 - 99 mg/dL Final   Glucose reference range applies only to samples taken after fasting for at least 8 hours.   Glucose-Capillary 02/06/2022 92  70 - 99 mg/dL Final   Glucose reference range applies only to samples taken after fasting for at least 8 hours.   Trichomonas 02/06/2022 Negative   Final   Bacterial Vaginitis-Urine 02/06/2022 Negative   Final   Molecular Comment 02/06/2022 For tests bacteria and/or candida, this specimen does not meet the   Final   Molecular Comment 02/06/2022 strict criteria set by the FDA. The result interpretation should be   Final   Molecular Comment 02/06/2022 considered in conjunction with the patient's clinical history.   Final   Comment 02/06/2022 Normal Reference Range Trichomonas - Negative   Final   Glucose-Capillary 02/06/2022 197 (H)  70 - 99 mg/dL Final   Glucose reference range applies only to samples taken after fasting for at least 8 hours.   Glucose-Capillary 02/07/2022 129 (H)  70 - 99 mg/dL Final   Glucose reference range applies only to samples taken after fasting for at least 8 hours.   Glucose-Capillary 02/07/2022 207 (H)  70 - 99 mg/dL Final   Glucose reference range applies only to  samples taken after fasting for at least 8 hours.   Glucose-Capillary 02/07/2022 153 (H)  70 - 99 mg/dL Final   Glucose reference range applies only to samples taken after fasting for at least 8 hours.  Glucose-Capillary 02/08/2022 129 (H)  70 - 99 mg/dL Final   Glucose reference range applies only to samples taken after fasting for at least 8 hours.   Glucose-Capillary 02/08/2022 107 (H)  70 - 99 mg/dL Final   Glucose reference range applies only to samples taken after fasting for at least 8 hours.  Admission on 01/23/2022, Discharged on 01/24/2022  Component Date Value Ref Range Status   Sodium 01/23/2022 140  135 - 145 mmol/L Final   Potassium 01/23/2022 4.4  3.5 - 5.1 mmol/L Final   Chloride 01/23/2022 101  98 - 111 mmol/L Final   CO2 01/23/2022 25  22 - 32 mmol/L Final   Glucose, Bld 01/23/2022 198 (H)  70 - 99 mg/dL Final   Glucose reference range applies only to samples taken after fasting for at least 8 hours.   BUN 01/23/2022 13  6 - 20 mg/dL Final   Creatinine, Ser 01/23/2022 0.62  0.44 - 1.00 mg/dL Final   Calcium 01/23/2022 9.3  8.9 - 10.3 mg/dL Final   GFR, Estimated 01/23/2022 >60  >60 mL/min Final   Comment: (NOTE) Calculated using the CKD-EPI Creatinine Equation (2021)    Anion gap 01/23/2022 14  5 - 15 Final   Performed at [facility], [place] [address] [place], [place] [address]   WBC 01/23/2022 5.8  4.0 - 10.5 K/uL Final   RBC 01/23/2022 4.63  3.87 - 5.11 MIL/uL Final   Hemoglobin 01/23/2022 11.7 (L)  12.0 - 15.0 g/dL Final   HCT 01/23/2022 37.4  36.0 - 46.0 % Final   MCV 01/23/2022 80.8  80.0 - 100.0 fL Final   MCH 01/23/2022 25.3 (L)  26.0 - 34.0 pg Final   MCHC 01/23/2022 31.3  30.0 - 36.0 g/dL Final   RDW 01/23/2022 17.9 (H)  11.5 - 15.5 % Final   Platelets 01/23/2022 333  150 - 400 K/uL Final   nRBC 01/23/2022 0.0  0.0 - 0.2 % Final   Performed at [facility] [address] [place], [place] [address]   Troponin I (High Sensitivity) 01/23/2022  6  <18 ng/L Final   Comment: (NOTE) Elevated high sensitivity troponin I (hsTnI) values and significant  changes across serial measurements may suggest ACS but many other  chronic and acute conditions are known to elevate hsTnI results.  Refer to the "Links" section for chest pain algorithms and additional  guidance. Performed at [facility], [place] [address] [place], [place] [address]    Troponin I (High Sensitivity) 01/23/2022 5  <18 ng/L Final   Comment: (NOTE) Elevated high sensitivity troponin I (hsTnI) values and significant  changes across serial measurements may suggest ACS but many other  chronic and acute conditions are known to elevate hsTnI results.  Refer to the "Links" section for chest pain algorithms and additional  guidance. Performed at [facility], [place] [address] [place], [place] [address]    SARS Coronavirus 2 by RT PCR 01/23/2022 NEGATIVE  NEGATIVE Final   Comment: (NOTE) SARS-CoV-2 target nucleic acids are NOT DETECTED.  The SARS-CoV-2 RNA is generally detectable in upper and lower respiratory specimens during the acute phase of infection. The lowest concentration of SARS-CoV-2 viral copies this assay can detect is 250 copies / mL. A negative result does not preclude SARS-CoV-2 infection and should not be used as the sole basis for treatment or other patient management decisions.  A negative result may occur with improper specimen collection / handling,  submission of specimen other than nasopharyngeal swab, presence of viral mutation(s) within the areas targeted by this assay, and inadequate number of viral copies (<250 copies / mL). A negative result must be combined with clinical observations, patient history, and epidemiological information.  Fact Sheet for Patients:   https://www.patel.info/  Fact Sheet for Healthcare Providers: https://hall.com/  This test is not yet approved or                            cleared by the [place] FDA and has been authorized for detection and/or diagnosis of SARS-CoV-2 by FDA under an Emergency Use Authorization (EUA).  This EUA will remain in effect (meaning this test can be used) for the duration of the COVID-19 declaration under Section 564(b)(1) of the Act, 21 U.S.C. section 360bbb-3(b)(1), unless the authorization is terminated or revoked sooner.  Performed at [facility], [place] [address], [place], [place] [address]    B Natriuretic Peptide 01/23/2022 41.3  0.0 - 100.0 pg/mL Final   Performed at [facility] [address], [place], [place] [address]   Group A Strep by PCR 01/23/2022 NOT DETECTED  NOT DETECTED Final   Performed at [facility], [place] [address] [place], [place] [address]   Color, Urine 01/23/2022 YELLOW  YELLOW Final   APPearance 01/23/2022 CLEAR  CLEAR Final   Specific Gravity, Urine 01/23/2022 1.018  1.005 - 1.030 Final   pH 01/23/2022 7.0  5.0 - 8.0 Final   Glucose, UA 01/23/2022 NEGATIVE  NEGATIVE mg/dL Final   Hgb urine dipstick 01/23/2022 NEGATIVE  NEGATIVE Final   Bilirubin Urine 01/23/2022 NEGATIVE  NEGATIVE Final   Ketones, ur 01/23/2022 NEGATIVE  NEGATIVE mg/dL Final   Protein, ur 01/23/2022 NEGATIVE  NEGATIVE mg/dL Final   Nitrite 01/23/2022 NEGATIVE  NEGATIVE Final   Leukocytes,Ua 01/23/2022 TRACE (A)  NEGATIVE Final   RBC / HPF 01/23/2022 0-5  0 - 5 RBC/hpf Final   WBC, UA 01/23/2022 0-5  0 - 5 WBC/hpf Final   Bacteria, UA 01/23/2022 NONE SEEN  NONE SEEN Final   Squamous Epithelial / LPF 01/23/2022 0-5  0 - 5 Final   Mucus 01/23/2022 PRESENT   Final   Performed at [facility], [place] [address], [place], [place] [address]   SARS Coronavirus 2 by RT PCR 01/23/2022 NEGATIVE  NEGATIVE Final   Comment: (NOTE) SARS-CoV-2 target nucleic acids are NOT DETECTED.  The SARS-CoV-2 RNA is generally detectable in upper respiratory specimens during the acute phase of infection. The  lowest concentration of SARS-CoV-2 viral copies this assay can detect is 138 copies/mL. A negative result does not preclude SARS-Cov-2 infection and should not be used as the sole basis for treatment or other patient management decisions. A negative result may occur with  improper specimen collection/handling, submission of specimen other than nasopharyngeal swab, presence of viral mutation(s) within the areas targeted by this assay, and inadequate number of viral copies(<138 copies/mL). A negative result must be combined with clinical observations, patient history, and epidemiological information. The expected result is Negative.  Fact Sheet for Patients:  EntrepreneurPulse.com.au  Fact Sheet for Healthcare Providers:  IncredibleEmployment.be  This test is no                          t yet approved or cleared by the [place] FDA and  has been authorized for detection and/or  diagnosis of SARS-CoV-2 by FDA under an Emergency Use Authorization (EUA). This EUA will remain  in effect (meaning this test can be used) for the duration of the COVID-19 declaration under Section 564(b)(1) of the Act, 21 U.S.C.section 360bbb-3(b)(1), unless the authorization is terminated  or revoked sooner.       Influenza A by PCR 01/23/2022 NEGATIVE  NEGATIVE Final   Influenza B by PCR 01/23/2022 NEGATIVE  NEGATIVE Final   Comment: (NOTE) The Xpert Xpress SARS-CoV-2/FLU/RSV plus assay is intended as an aid in the diagnosis of influenza from Nasopharyngeal swab specimens and should not be used as a sole basis for treatment. Nasal washings and aspirates are unacceptable for Xpert Xpress SARS-CoV-2/FLU/RSV testing.  Fact Sheet for Patients: EntrepreneurPulse.com.au  Fact Sheet for Healthcare Providers: IncredibleEmployment.be  This test is not yet approved or cleared by the [place] FDA and has been authorized for  detection and/or diagnosis of SARS-CoV-2 by FDA under an Emergency Use Authorization (EUA). This EUA will remain in effect (meaning this test can be used) for the duration of the COVID-19 declaration under Section 564(b)(1) of the Act, 21 U.S.C. section 360bbb-3(b)(1), unless the authorization is terminated or revoked.  Performed at [facility], [place] [address], [place], [place] [address]    WBC 01/24/2022 6.1  4.0 - 10.5 K/uL Final   RBC 01/24/2022 4.60  3.87 - 5.11 MIL/uL Final   Hemoglobin 01/24/2022 11.7 (L)  12.0 - 15.0 g/dL Final   HCT 01/24/2022 37.0  36.0 - 46.0 % Final   MCV 01/24/2022 80.4  80.0 - 100.0 fL Final   MCH 01/24/2022 25.4 (L)  26.0 - 34.0 pg Final   MCHC 01/24/2022 31.6  30.0 - 36.0 g/dL Final   RDW 01/24/2022 17.6 (H)  11.5 - 15.5 % Final   Platelets 01/24/2022 334  150 - 400 K/uL Final   nRBC 01/24/2022 0.0  0.0 - 0.2 % Final   Neutrophils Relative % 01/24/2022 53  % Final   Neutro Abs 01/24/2022 3.3  1.7 - 7.7 K/uL Final   Lymphocytes Relative 01/24/2022 33  % Final   Lymphs Abs 01/24/2022 2.0  0.7 - 4.0 K/uL Final   Monocytes Relative 01/24/2022 11  % Final   Monocytes Absolute 01/24/2022 0.7  0.1 - 1.0 K/uL Final   Eosinophils Relative 01/24/2022 2  % Final   Eosinophils Absolute 01/24/2022 0.1  0.0 - 0.5 K/uL Final   Basophils Relative 01/24/2022 1  % Final   Basophils Absolute 01/24/2022 0.1  0.0 - 0.1 K/uL Final   Immature Granulocytes 01/24/2022 0  % Final   Abs Immature Granulocytes 01/24/2022 0.02  0.00 - 0.07 K/uL Final   Performed at [facility], [place] [address], [place], [place] [address]   Glucose-Capillary 01/24/2022 110 (H)  70 - 99 mg/dL Final   Glucose reference range applies only to samples taken after fasting for at least 8 hours.  No results displayed because visit has over 200 results.    Admission on 11/22/2021, Discharged on 11/22/2021  Component Date Value Ref Range Status   Glucose-Capillary 11/22/2021 169 (H)  70 -  99 mg/dL Final   Glucose reference range applies only to samples taken after fasting for at least 8 hours.  No results displayed because visit has over 200 results.    Admission on 10/04/2021, Discharged on 10/22/2021  Component Date Value Ref Range Status   SARS Coronavirus 2 by RT PCR 10/04/2021 NEGATIVE  NEGATIVE Final  Comment: (NOTE) SARS-CoV-2 target nucleic acids are NOT DETECTED.  The SARS-CoV-2 RNA is generally detectable in upper respiratory specimens during the acute phase of infection. The lowest concentration of SARS-CoV-2 viral copies this assay can detect is 138 copies/mL. A negative result does not preclude SARS-Cov-2 infection and should not be used as the sole basis for treatment or other patient management decisions. A negative result may occur with  improper specimen collection/handling, submission of specimen other than nasopharyngeal swab, presence of viral mutation(s) within the areas targeted by this assay, and inadequate number of viral copies(<138 copies/mL). A negative result must be combined with clinical observations, patient history, and epidemiological information. The expected result is Negative.  Fact Sheet for Patients:  EntrepreneurPulse.com.au  Fact Sheet for Healthcare Providers:  IncredibleEmployment.be  This test is no                          t yet approved or cleared by the [place] FDA and  has been authorized for detection and/or diagnosis of SARS-CoV-2 by FDA under an Emergency Use Authorization (EUA). This EUA will remain  in effect (meaning this test can be used) for the duration of the COVID-19 declaration under Section 564(b)(1) of the Act, 21 U.S.C.section 360bbb-3(b)(1), unless the authorization is terminated  or revoked sooner.       Influenza A by PCR 10/04/2021 NEGATIVE  NEGATIVE Final   Influenza B by PCR 10/04/2021 NEGATIVE  NEGATIVE Final   Comment: (NOTE) The Xpert Xpress  SARS-CoV-2/FLU/RSV plus assay is intended as an aid in the diagnosis of influenza from Nasopharyngeal swab specimens and should not be used as a sole basis for treatment. Nasal washings and aspirates are unacceptable for Xpert Xpress SARS-CoV-2/FLU/RSV testing.  Fact Sheet for Patients: EntrepreneurPulse.com.au  Fact Sheet for Healthcare Providers: IncredibleEmployment.be  This test is not yet approved or cleared by the [place] FDA and has been authorized for detection and/or diagnosis of SARS-CoV-2 by FDA under an Emergency Use Authorization (EUA). This EUA will remain in effect (meaning this test can be used) for the duration of the COVID-19 declaration under Section 564(b)(1) of the Act, 21 U.S.C. section 360bbb-3(b)(1), unless the authorization is terminated or revoked.  Performed at [facility], [place] [address], [place], [place] [address]    WBC 10/04/2021 8.4  4.0 - 10.5 K/uL Final   RBC 10/04/2021 4.42  3.87 - 5.11 MIL/uL Final   Hemoglobin 10/04/2021 11.6 (L)  12.0 - 15.0 g/dL Final   HCT 10/04/2021 35.7 (L)  36.0 - 46.0 % Final   MCV 10/04/2021 80.8  80.0 - 100.0 fL Final   MCH 10/04/2021 26.2  26.0 - 34.0 pg Final   MCHC 10/04/2021 32.5  30.0 - 36.0 g/dL Final   RDW 10/04/2021 16.0 (H)  11.5 - 15.5 % Final   Platelets 10/04/2021 372  150 - 400 K/uL Final   nRBC 10/04/2021 0.0  0.0 - 0.2 % Final   Neutrophils Relative % 10/04/2021 68  % Final   Neutro Abs 10/04/2021 5.7  1.7 - 7.7 K/uL Final   Lymphocytes Relative 10/04/2021 22  % Final   Lymphs Abs 10/04/2021 1.8  0.7 - 4.0 K/uL Final   Monocytes Relative 10/04/2021 8  % Final   Monocytes Absolute 10/04/2021 0.7  0.1 - 1.0 K/uL Final   Eosinophils Relative 10/04/2021 1  % Final   Eosinophils Absolute 10/04/2021 0.1  0.0 - 0.5 K/uL Final   Basophils  Relative 10/04/2021 1  % Final   Basophils Absolute 10/04/2021 0.1  0.0 - 0.1 K/uL Final   Immature Granulocytes  10/04/2021 0  % Final   Abs Immature Granulocytes 10/04/2021 0.03  0.00 - 0.07 K/uL Final   Performed at [facility], [place] [address], [place], [place] [address]   Sodium 10/04/2021 136  135 - 145 mmol/L Final   Potassium 10/04/2021 4.2  3.5 - 5.1 mmol/L Final   Chloride 10/04/2021 104  98 - 111 mmol/L Final   CO2 10/04/2021 25  22 - 32 mmol/L Final   Glucose, Bld 10/04/2021 100 (H)  70 - 99 mg/dL Final   Glucose reference range applies only to samples taken after fasting for at least 8 hours.   BUN 10/04/2021 9  6 - 20 mg/dL Final   Creatinine, Ser 10/04/2021 0.52  0.44 - 1.00 mg/dL Final   Calcium 10/04/2021 9.0  8.9 - 10.3 mg/dL Final   Total Protein 10/04/2021 7.0  6.5 - 8.1 g/dL Final   Albumin 10/04/2021 3.2 (L)  3.5 - 5.0 g/dL Final   AST 10/04/2021 13 (L)  15 - 41 U/L Final   ALT 10/04/2021 10  0 - 44 U/L Final   Alkaline Phosphatase 10/04/2021 61  38 - 126 U/L Final   Total Bilirubin 10/04/2021 0.3  0.3 - 1.2 mg/dL Final   GFR, Estimated 10/04/2021 >60  >60 mL/min Final   Comment: (NOTE) Calculated using the CKD-EPI Creatinine Equation (2021)    Anion gap 10/04/2021 7  5 - 15 Final   Performed at [facility] [address], [place], [place] [address]   Hgb A1c MFr Bld 10/04/2021 7.0 (H)  4.8 - 5.6 % Final   Comment: (NOTE) Pre diabetes:          5.7%-6.4%  Diabetes:              >6.4%  Glycemic control for   <7.0% adults with diabetes    Mean Plasma Glucose 10/04/2021 154.2  mg/dL Final   Performed at [facility], [place] [address] [place], [place] [address]   Cholesterol 10/04/2021 178  0 - 200 mg/dL Final   Triglycerides 10/04/2021 155 (H)  <150 mg/dL Final   HDL 10/04/2021 52  >40 mg/dL Final   Total CHOL/HDL Ratio 10/04/2021 3.4  RATIO Final   VLDL 10/04/2021 31  0 - 40 mg/dL Final   LDL Cholesterol 10/04/2021 95  0 - 99 mg/dL Final   Comment:        Total Cholesterol/HDL:CHD Risk Coronary Heart Disease Risk Table                     Men    Women  1/2 Average Risk   3.4   3.3  Average Risk       5.0   4.4  2 X Average Risk   9.6   7.1  3 X Average Risk  23.4   11.0        Use the calculated Patient Ratio above and the CHD Risk Table to determine the patient's CHD Risk.        ATP III CLASSIFICATION (LDL):  <100     mg/dL   Optimal  100-129  mg/dL   Near or Above                    Optimal  130-159  mg/dL   Borderline  160-189  mg/dL   High  >190     mg/dL   Very High Performed at [facility] [address], [place], [place] [address]    POC Amphetamine UR 10/04/2021 None Detected  NONE DETECTED (Cut Off Level 1000 ng/mL) Final   POC Secobarbital (BAR) 10/04/2021 None Detected  NONE DETECTED (Cut Off Level 300 ng/mL) Final   POC Buprenorphine (BUP) 10/04/2021 None Detected  NONE DETECTED (Cut Off Level 10 ng/mL) Final   POC Oxazepam (BZO) 10/04/2021 None Detected  NONE DETECTED (Cut Off Level 300 ng/mL) Final   POC Cocaine UR 10/04/2021 None Detected  NONE DETECTED (Cut Off Level 300 ng/mL) Final   POC Methamphetamine UR 10/04/2021 None Detected  NONE DETECTED (Cut Off Level 1000 ng/mL) Final   POC Morphine 10/04/2021 None Detected  NONE DETECTED (Cut Off Level 300 ng/mL) Final   POC Methadone UR 10/04/2021 None Detected  NONE DETECTED (Cut Off Level 300 ng/mL) Final   POC Oxycodone UR 10/04/2021 Positive (A)  NONE DETECTED (Cut Off Level 100 ng/mL) Final   POC Marijuana UR 10/04/2021 None Detected  NONE DETECTED (Cut Off Level 50 ng/mL) Final   SARSCOV2ONAVIRUS 2 AG 10/04/2021 NEGATIVE  NEGATIVE Final   Comment: (NOTE) SARS-CoV-2 antigen NOT DETECTED.   Negative results are presumptive.  Negative results do not preclude SARS-CoV-2 infection and should not be used as the sole basis for treatment or other patient management decisions, including infection  control decisions, particularly in the presence of clinical signs and  symptoms consistent with COVID-19, or in those who have been in contact with the  virus.  Negative results must be combined with clinical observations, patient history, and epidemiological information. The expected result is Negative.  Fact Sheet for Patients: HandmadeRecipes.com.cy  Fact Sheet for Healthcare Providers: FuneralLife.at  This test is not yet approved or cleared by the [place] FDA and  has been authorized for detection and/or diagnosis of SARS-CoV-2 by FDA under an Emergency Use Authorization (EUA).  This EUA will remain in effect (meaning this test can be used) for the duration of  the COV                          ID-19 declaration under Section 564(b)(1) of the Act, 21 U.S.C. section 360bbb-3(b)(1), unless the authorization is terminated or revoked sooner.     Valproic Acid Lvl 10/04/2021 51  50.0 - 100.0 ug/mL Final   Performed at [facility] [address], [place], [place] [address]   Valproic Acid Lvl 10/08/2021 57  50.0 - 100.0 ug/mL Final   Performed at [facility] [address] [place], [place] [address]   Glucose-Capillary 10/09/2021 123 (H)  70 - 99 mg/dL Final   Glucose reference range applies only to samples taken after fasting for at least 8 hours.  There may be more visits with results that are not included.    Blood Alcohol level:  Lab Results  Component Value Date   ETH <10 11/23/2021   ETH <10 27/04/5007    Metabolic Disorder Labs: Lab Results  Component Value Date   HGBA1C 6.7 (H) 12/28/2021   MPG 145.59 12/28/2021   MPG 154.2 10/04/2021   Lab Results  Component Value Date   PROLACTIN 3.8 (L) 11/23/2021   Lab Results  Component Value Date   CHOL 171 11/23/2021   TRIG 125 11/23/2021   HDL 65 11/23/2021   CHOLHDL 2.6 11/23/2021  VLDL 25 11/23/2021   LDLCALC 81 11/23/2021   LDLCALC 95 10/04/2021    Therapeutic Lab Levels: No results found for: "LITHIUM" Lab Results  Component Value Date   VALPROATE 17 (L) 02/17/2022   VALPROATE 33 (L)  01/18/2022   No results found for: "CBMZ"  Physical Findings   PHQ2-9    [facility] ED from [facility]23/[facility]  PHQ-2 Total Score 2  PHQ-9 Total Score 3      Flowsheet Row ED from 02/18/2022 in [facility] ED from 02/17/2022 in [facility] ED from 02/09/2022 in [facility] No Risk No Risk No Risk        Musculoskeletal  Strength & Muscle Tone: within normal limits Gait & Station: normal Patient leans: N/A  Psychiatric Specialty Exam  Presentation  General Appearance:  Appropriate for Environment; Casual  Eye Contact: Good  Speech: Clear and Coherent; Normal Rate  Speech Volume: Normal  Handedness: Right   Mood and Affect  Mood: Euthymic  Affect: Blunt   Thought Process  Thought Processes: Coherent; Goal Directed; Linear  Descriptions of Associations:Intact  Orientation:Full (Time, Place and Person)  Thought Content:WDL  Diagnosis of Schizophrenia or Schizoaffective disorder in past: Yes  Duration of Psychotic Symptoms: No data recorded  Hallucinations:Hallucinations: None  Ideas of Reference:None  Suicidal Thoughts:Suicidal Thoughts: No  Homicidal Thoughts:Homicidal Thoughts: No   Sensorium  Memory: Immediate Fair  Judgment: Intact  Insight: Present   Executive Functions  Concentration: Fair  Attention Span: Fair  Recall: [employer] of Knowledge: Fair  Language: Fair   Psychomotor Activity  Psychomotor Activity: Psychomotor Activity: Normal   Assets  Assets: Communication Skills; Desire for Improvement; Financial Resources/Insurance; Leisure Time; Resilience   Sleep  Sleep: Sleep: Good   No data recorded  Physical Exam  Physical Exam Constitutional:      General: She is not in acute distress.    Appearance: She is not ill-appearing,  toxic-appearing or diaphoretic.  Eyes:     General: No scleral icterus. Cardiovascular:     Rate and Rhythm: Normal rate.  Pulmonary:     Effort: Pulmonary effort is normal. No respiratory distress.  Neurological:     Mental Status: She is alert and oriented to person, place, and time.  Psychiatric:        Attention and Perception: Attention and perception normal.        Mood and Affect: Mood normal. Affect is blunt.        Speech: Speech normal.        Behavior: Behavior normal. Behavior is cooperative.        Thought Content: Thought content normal.    Review of Systems  Constitutional:  Negative for chills and fever.  Respiratory:  Negative for shortness of breath.   Cardiovascular:  Negative for chest pain and palpitations.  Neurological:  Negative for headaches.   Blood pressure 109/69, pulse 89, temperature 98.4 F (36.9 C), temperature source Oral, resp. rate 20, SpO2 99 %. There is no height or weight on file to calculate BMI.  Treatment Plan Summary: Plan Pt remains psychiatrically cleared. Disposition is pending possible placement at [facility].   Tharon Aquas, NP 02/26/2022 8:31 AM

## 2022-02-26 NOTE — ED Notes (Signed)
Pt calm and cooperative. No c/o pain or distress. Will continue to monitor for safety 

## 2022-02-26 NOTE — Progress Notes (Signed)
Received Teresa Murillo this AM awake sitting on her chair bed. She ate breakfast, compliant with her medications and social with her peer and staff. She endorsed feeling ok today without compliant's.

## 2022-02-26 NOTE — ED Notes (Signed)
Pt resting in bed. Respirations even and unlabored. Monitoring for safety. 

## 2022-02-26 NOTE — Care Management (Signed)
OBS Care Management   Writer left a voice HIPAA compliant voice mail message and an email to the Care Manager Mason Jim at [phone] [email or web address] And the owner at [employer] [phone]  [email or web address] to assesses the status of the patient's authorization.

## 2022-02-27 DIAGNOSIS — F209 Schizophrenia, unspecified: Secondary | ICD-10-CM | POA: Diagnosis not present

## 2022-02-27 LAB — GLUCOSE, CAPILLARY
Glucose-Capillary: 124 mg/dL — ABNORMAL HIGH (ref 70–99)
Glucose-Capillary: 161 mg/dL — ABNORMAL HIGH (ref 70–99)
Glucose-Capillary: 170 mg/dL — ABNORMAL HIGH (ref 70–99)
Glucose-Capillary: 154 mg/dL — ABNORMAL HIGH (ref 70–99)

## 2022-02-27 NOTE — ED Notes (Signed)
Patient is alert & oriented x4. Denies SI, HI and AVH. Patient interacting with peer while watching television. Will continue to monitor for safety and update as needed.

## 2022-02-27 NOTE — ED Provider Notes (Cosign Needed)
Behavioral Health Progress Note  Date and Time: 02/27/2022 10:22 AM Name: Teresa Murillo MRN:  [id number]    Diagnosis:  Final diagnoses:  Schizophrenia, unspecified type ([place])    Total Time spent with patient: 15 minutes  Subjective:   Teresa Murillo, 61 year old, female, with schizophrenia, aggressive behavior,  IDD (mild), admitted to  [facility] on 11/23/21 voluntarily, accompanied by [employer], and reported that she was locked out of her group home. It was later confirmed that Teresa Murillo was dismissed from group home due to recurrent history of eloping from the group home. Patient was evaluated here subsequently discharged from [facility], however has remained at this facility as a boarder while DSS continues efforts to find permanent placement for Teresa Murillo.   Brief summary of cognitive evaluations completed during stay here at [facility] per chart review:  (AID to Capacity Evaluation (ACE) completed by Dr. Louis Meckel, Dr. Dwyane Dee on 12/11/2021.  Please see media tab for full details.    Teresa Harman, PhD completed: Wechsler Adult Intelligence Scale-4, Teresa Murillo achieved a full-scale IQ score of 73 and a percentile rank of 4 placing her in the borderline range of intellectual functioning (12/14/2021) Please see consult note from Teresa Harman, PhD on 12/14/2021.)   On reassessment evaluation Teresa Murillo is laying in bed without acute distress.  She is alert, oriented x 4, calm, cooperative and attentive.  Her mood is euthymic with a blunted affect. She has normal speech, and behavior.  Objectively there is no evidence of psychosis/mania or delusional thinking.  Patient is able to converse coherently, goal directed thoughts, no distractibility, or pre-occupation .She also denies suicidal/self-harm/homicidal ideation, psychosis, and paranoia.  Patient continues to be psychiatrically cleared and has no apparent acute psychiatric crisis.  Patient does not meet inpatient psychiatric treatment criteria.  Current plan  as of 02/27/22: Disposition is pending placement, possible placement at [facility].   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 On the Wechsler Adult Intelligence Scale-4, Teresa Murillo achieved a full-scale IQ score of 73 and a percentile rank of 4 placing her in the borderline range of intellectual functioning.    Chronic obstructive pulmonary disease (COPD) (HCC)    Glaucoma    Hyperlipidemia    Hypertension    Iron deficiency    Schizoaffective disorder (HCC)    Type 2 diabetes mellitus (HCC)     Past Surgical History:  Procedure Laterality Date   TUBAL LIGATION     Family History:  Family History  Problem Relation Age of Onset   Breast cancer Maternal Grandmother     Social History:  Social History   Substance and Sexual Activity  Alcohol Use Not Currently     Social History   Substance and Sexual Activity  Drug Use Not Currently    Social History   Socioeconomic History   Marital status: Divorced    Spouse name: Not on file   Number of children: Not on file   Years of education: Not on file   Highest education level: Not on file  Occupational History   Not on file  Tobacco Use   Smoking status: Former    Packs/day: 1.00    Types: Cigars, Cigarettes    Quit date: 11/09/2021    Years since quitting: 0.3   Smokeless tobacco: Current  Vaping Use   Vaping Use: Never used  Substance and Sexual  Activity   Alcohol use: Not Currently   Drug use: Not Currently   Sexual activity: Not Currently    Birth control/protection: Surgical  Other Topics Concern   Not on file  Social History Narrative   Not on file   Social Determinants of Health   Financial Resource Strain: Not on file  Food Insecurity: Not on file  Transportation Needs: Not on file  Physical Activity: Not on file  Stress: Not on file  Social  Connections: Not on file   SDOH:  SDOH Screenings   Depression (PHQ2-9): Low Risk  (11/23/2021)  Tobacco Use: High Risk (02/17/2022)  Current Medications:  Current Facility-Administered Medications  Medication Dose Route Frequency Provider Last Rate Last Admin   acetaminophen (TYLENOL) tablet 650 mg  650 mg Oral Q6H PRN [name], NP   650 mg at 02/25/22 2129   albuterol (VENTOLIN HFA) 108 (90 Base) MCG/ACT inhaler 2 puff  2 puff Inhalation Q6H PRN [name], NP   2 puff at 02/20/22 1944   alum & mag hydroxide-simeth (MAALOX/MYLANTA) 200-200-20 MG/5ML suspension 30 mL  30 mL Oral Q4H PRN [name], NP   30 mL at 02/21/22 2051   apixaban (ELIQUIS) tablet 5 mg  5 mg Oral BID [name], NP   5 mg at 02/27/22 0802   ARIPiprazole (ABILIFY) tablet 10 mg  10 mg Oral Daily [name], NP   10 mg at 02/27/22 0802   cloZAPine (CLOZARIL) tablet 75 mg  75 mg Oral BID France Ravens, MD   75 mg at 02/27/22 0802   colchicine tablet 0.6 mg  0.6 mg Oral QHS [name], NP   0.6 mg at 02/26/22 2145   diltiazem (CARDIZEM CD) 24 hr capsule 240 mg  240 mg Oral Daily [name], NP   240 mg at 02/27/22 0802   divalproex (DEPAKOTE ER) 24 hr tablet 500 mg  500 mg Oral BID [name], NP   500 mg at 02/27/22 0801   haloperidol (HALDOL) tablet 10 mg  10 mg Oral TID PRN [name], NP       insulin aspart (novoLOG) injection 0-9 Units  0-9 Units Subcutaneous TID WC France Ravens, MD   1 Units at 02/27/22 0801   insulin glargine-yfgn (SEMGLEE) injection 12 Units  12 Units Subcutaneous Daily [name], NP   12 Units at 02/27/22 1003   latanoprost (XALATAN) 0.005 % ophthalmic solution 1 drop  1 drop Both Eyes QHS [name], NP   1 drop at 02/26/22 2147   magnesium hydroxide (MILK OF MAGNESIA) suspension 30 mL  30 mL Oral Daily PRN [name], NP       metFORMIN (GLUCOPHAGE) tablet 500 mg  500 mg Oral BID WC [name], NP   500 mg at 02/27/22 0802   mometasone-formoterol (DULERA)  200-5 MCG/ACT inhaler 2 puff  2 puff Inhalation BID [name], NP   2 puff at 02/27/22 0801   nicotine (NICODERM CQ - dosed in mg/24 hr) patch 7 mg  7 mg Transdermal Daily [name], NP   7 mg at 02/27/22 0805   valbenazine (INGREZZA) capsule 40 mg  40 mg Oral q AM [name], NP   40 mg at 02/27/22 0604   Vitamin D (Ergocalciferol) (DRISDOL) 1.25 MG (50000 UNIT) capsule 50,000 Units  50,000 Units Oral Q Leonie Green, NP   50,000 Units at 02/25/22 2426   Current Outpatient Medications  Medication Sig Dispense Refill   Accu-Chek Softclix Lancets lancets  Use as directed up to four times daily 100 each 0   apixaban (ELIQUIS) 5 MG TABS tablet Take 1 tablet (5 mg total) by mouth 2 (two) times daily. 60 tablet 0   ARIPiprazole (ABILIFY) 10 MG tablet Take 1 tablet (10 mg total) by mouth daily. 30 tablet 0   Blood Glucose Monitoring Suppl (ACCU-CHEK GUIDE) w/Device KIT Use as directed up to four times daily 1 kit 0   budesonide-formoterol (SYMBICORT) 160-4.5 MCG/ACT inhaler Inhale 2 puffs into the lungs in the morning and at bedtime.     Cholecalciferol (VITAMIN D3) 1.25 MG (50000 UT) CAPS Take 50,000 Units by mouth every Thursday.     clozapine (CLOZARIL) 50 MG tablet Take 1 tablet (50 mg total) by mouth daily. 30 tablet 0   colchicine 0.6 MG tablet Take 1 tablet (0.6 mg total) by mouth at bedtime. 30 tablet 0   diltiazem (CARDIZEM CD) 240 MG 24 hr capsule Take 1 capsule (240 mg total) by mouth daily. (Patient not taking: Reported on 11/24/2021) 30 capsule 0   divalproex (DEPAKOTE ER) 500 MG 24 hr tablet Take 1 tablet (500 mg total) by mouth 2 (two) times daily. 60 tablet 0   glucose blood test strip Use as directed up to four times daily 50 each 0   haloperidol (HALDOL) 10 MG tablet Take 1 tablet (10 mg total) by mouth 3 (three) times daily as needed for agitation (and psychotic symptoms).     INGREZZA 40 MG capsule Take 1 capsule (40 mg total) by mouth in the morning. 30 capsule 0    insulin aspart (NOVOLOG) 100 UNIT/ML FlexPen Before each meal 3 times a day, 140-199 - 2 units, 200-250 - 4 units, 251-299 - 6 units,  300-349 - 8 units,  350 or above 10 units. Insulin PEN if approved, provide syringes and needles if needed.Please switch to any approved short acting Insulin if needed. 15 mL 0   insulin glargine (LANTUS) 100 UNIT/ML Solostar Pen Inject 12 Units into the skin daily. 15 mL 0   Insulin Pen Needle 32G X 4 MM MISC Use 4 times a day with insulin, 1 month supply. 100 each 0   latanoprost (XALATAN) 0.005 % ophthalmic solution Place 1 drop into both eyes at bedtime.     metFORMIN (GLUCOPHAGE) 500 MG tablet Take 500 mg by mouth 2 (two) times daily with a meal.     nicotine (NICODERM CQ - DOSED IN MG/24 HR) 7 mg/24hr patch Place 1 patch (7 mg total) onto the skin daily. 28 patch 0   PROAIR HFA 108 (90 Base) MCG/ACT inhaler Inhale 2 puffs into the lungs every 6 (six) hours as needed for wheezing or shortness of breath.      Labs  Lab Results:  Admission on 02/18/2022  Component Date Value Ref Range Status   Glucose-Capillary 02/18/2022 254 (H)  70 - 99 mg/dL Final   Glucose reference range applies only to samples taken after fasting for at least 8 hours.   Glucose-Capillary 02/18/2022 112 (H)  70 - 99 mg/dL Final   Glucose reference range applies only to samples taken after fasting for at least 8 hours.   Glucose-Capillary 02/19/2022 159 (H)  70 - 99 mg/dL Final   Glucose reference range applies only to samples taken after fasting for at least 8 hours.   Glucose-Capillary 02/19/2022 160 (H)  70 - 99 mg/dL Final   Glucose reference range applies only to samples taken after fasting for at least 8  hours.   Glucose-Capillary 02/19/2022 177 (H)  70 - 99 mg/dL Final   Glucose reference range applies only to samples taken after fasting for at least 8 hours.   Glucose-Capillary 02/19/2022 147 (H)  70 - 99 mg/dL Final   Glucose reference range applies only to samples taken after  fasting for at least 8 hours.   Glucose-Capillary 02/20/2022 126 (H)  70 - 99 mg/dL Final   Glucose reference range applies only to samples taken after fasting for at least 8 hours.   Glucose-Capillary 02/20/2022 132 (H)  70 - 99 mg/dL Final   Glucose reference range applies only to samples taken after fasting for at least 8 hours.   Glucose-Capillary 02/20/2022 125 (H)  70 - 99 mg/dL Final   Glucose reference range applies only to samples taken after fasting for at least 8 hours.   Glucose-Capillary 02/21/2022 151 (H)  70 - 99 mg/dL Final   Glucose reference range applies only to samples taken after fasting for at least 8 hours.   Glucose-Capillary 02/21/2022 106 (H)  70 - 99 mg/dL Final   Glucose reference range applies only to samples taken after fasting for at least 8 hours.   Glucose-Capillary 02/21/2022 149 (H)  70 - 99 mg/dL Final   Glucose reference range applies only to samples taken after fasting for at least 8 hours.   Glucose-Capillary 02/22/2022 131 (H)  70 - 99 mg/dL Final   Glucose reference range applies only to samples taken after fasting for at least 8 hours.   Glucose-Capillary 02/22/2022 141 (H)  70 - 99 mg/dL Final   Glucose reference range applies only to samples taken after fasting for at least 8 hours.   Glucose-Capillary 02/22/2022 183 (H)  70 - 99 mg/dL Final   Glucose reference range applies only to samples taken after fasting for at least 8 hours.   Glucose-Capillary 02/23/2022 133 (H)  70 - 99 mg/dL Final   Glucose reference range applies only to samples taken after fasting for at least 8 hours.   WBC 02/23/2022 4.8  4.0 - 10.5 K/uL Final   RBC 02/23/2022 3.96  3.87 - 5.11 MIL/uL Final   Hemoglobin 02/23/2022 9.7 (L)  12.0 - 15.0 g/dL Final   HCT 02/23/2022 30.6 (L)  36.0 - 46.0 % Final   MCV 02/23/2022 77.3 (L)  80.0 - 100.0 fL Final   MCH 02/23/2022 24.5 (L)  26.0 - 34.0 pg Final   MCHC 02/23/2022 31.7  30.0 - 36.0 g/dL Final   RDW 02/23/2022 16.5 (H)  11.5  - 15.5 % Final   Platelets 02/23/2022 372  150 - 400 K/uL Final   nRBC 02/23/2022 0.0  0.0 - 0.2 % Final   Neutrophils Relative % 02/23/2022 56  % Final   Neutro Abs 02/23/2022 2.7  1.7 - 7.7 K/uL Final   Lymphocytes Relative 02/23/2022 32  % Final   Lymphs Abs 02/23/2022 1.5  0.7 - 4.0 K/uL Final   Monocytes Relative 02/23/2022 9  % Final   Monocytes Absolute 02/23/2022 0.4  0.1 - 1.0 K/uL Final   Eosinophils Relative 02/23/2022 2  % Final   Eosinophils Absolute 02/23/2022 0.1  0.0 - 0.5 K/uL Final   Basophils Relative 02/23/2022 1  % Final   Basophils Absolute 02/23/2022 0.1  0.0 - 0.1 K/uL Final   Immature Granulocytes 02/23/2022 0  % Final   Abs Immature Granulocytes 02/23/2022 0.01  0.00 - 0.07 K/uL Final   Performed at [facility], [place] [address], [place], [place] [address]   Glucose-Capillary 02/23/2022 202 (H)  70 - 99 mg/dL Final   Glucose reference range applies only to samples taken after fasting for at least 8 hours.   Glucose-Capillary 02/23/2022 143 (H)  70 - 99 mg/dL Final   Glucose reference range applies only to samples taken after fasting for at least 8 hours.   Glucose-Capillary 02/23/2022 143 (H)  70 - 99 mg/dL Final   Glucose reference range applies only to samples taken after fasting for at least 8 hours.   Glucose-Capillary 02/24/2022 207 (H)  70 - 99 mg/dL Final   Glucose reference range applies only to samples taken after fasting for at least 8 hours.   Glucose-Capillary 02/24/2022 109 (H)  70 - 99 mg/dL Final   Glucose reference range applies only to samples taken after fasting for at least 8 hours.   Glucose-Capillary 02/24/2022 127 (H)  70 - 99 mg/dL Final   Glucose reference range applies only to samples taken after fasting for at least 8 hours.   Glucose-Capillary 02/24/2022 130 (H)  70 - 99 mg/dL Final   Glucose reference range applies only to samples taken after fasting for at least 8 hours.   Glucose-Capillary 02/25/2022 117 (H)  70 - 99 mg/dL  Final   Glucose reference range applies only to samples taken after fasting for at least 8 hours.   Glucose-Capillary 02/25/2022 104 (H)  70 - 99 mg/dL Final   Glucose reference range applies only to samples taken after fasting for at least 8 hours.   Glucose-Capillary 02/25/2022 180 (H)  70 - 99 mg/dL Final   Glucose reference range applies only to samples taken after fasting for at least 8 hours.   Glucose-Capillary 02/25/2022 180 (H)  70 - 99 mg/dL Final   Glucose reference range applies only to samples taken after fasting for at least 8 hours.   Glucose-Capillary 02/26/2022 133 (H)  70 - 99 mg/dL Final   Glucose reference range applies only to samples taken after fasting for at least 8 hours.   Glucose-Capillary 02/26/2022 85  70 - 99 mg/dL Final   Glucose reference range applies only to samples taken after fasting for at least 8 hours.   Glucose-Capillary 02/26/2022 136 (H)  70 - 99 mg/dL Final   Glucose reference range applies only to samples taken after fasting for at least 8 hours.   Glucose-Capillary 02/26/2022 170 (H)  70 - 99 mg/dL Final   Glucose reference range applies only to samples taken after fasting for at least 8 hours.   Glucose-Capillary 02/26/2022 185 (H)  70 - 99 mg/dL Final   Glucose reference range applies only to samples taken after fasting for at least 8 hours.   Glucose-Capillary 02/27/2022 124 (H)  70 - 99 mg/dL Final   Glucose reference range applies only to samples taken after fasting for at least 8 hours.  Admission on 02/17/2022, Discharged on 02/18/2022  Component Date Value Ref Range Status   Sodium 02/17/2022 136  135 - 145 mmol/L Final   Potassium 02/17/2022 3.9  3.5 - 5.1 mmol/L Final   Chloride 02/17/2022 101  98 - 111 mmol/L Final   CO2 02/17/2022 25  22 - 32 mmol/L Final   Glucose, Bld 02/17/2022 113 (H)  70 - 99 mg/dL Final   Glucose reference range applies only to samples taken after fasting for at least 8 hours.   BUN 02/17/2022 9  6 - 20 mg/dL  Final   Creatinine, Ser 02/17/2022 0.50  0.44 - 1.00 mg/dL Final   Calcium 02/17/2022 8.9  8.9 - 10.3 mg/dL Final   GFR, Estimated 02/17/2022 >60  >60 mL/min Final   Comment: (NOTE) Calculated using the CKD-EPI Creatinine Equation (2021)    Anion gap 02/17/2022 10  5 - 15 Final   Performed at [facility] [address] [place], [place] [address]   WBC 02/17/2022 6.5  4.0 - 10.5 K/uL Final   RBC 02/17/2022 4.24  3.87 - 5.11 MIL/uL Final   Hemoglobin 02/17/2022 10.2 (L)  12.0 - 15.0 g/dL Final   HCT 02/17/2022 33.2 (L)  36.0 - 46.0 % Final   MCV 02/17/2022 78.3 (L)  80.0 - 100.0 fL Final   MCH 02/17/2022 24.1 (L)  26.0 - 34.0 pg Final   MCHC 02/17/2022 30.7  30.0 - 36.0 g/dL Final   RDW 02/17/2022 17.2 (H)  11.5 - 15.5 % Final   Platelets 02/17/2022 390  150 - 400 K/uL Final   nRBC 02/17/2022 0.0  0.0 - 0.2 % Final   Performed at [facility], [place] [address], [place], [place] [address]   Troponin I (High Sensitivity) 02/17/2022 4  <18 ng/L Final   Comment: (NOTE) Elevated high sensitivity troponin I (hsTnI) values and significant  changes across serial measurements may suggest ACS but many other  chronic and acute conditions are known to elevate hsTnI results.  Refer to the "Links" section for chest pain algorithms and additional  guidance. Performed at [facility], [place] [address] [place], [place] [address]    Valproic Acid Lvl 02/17/2022 17 (L)  50.0 - 100.0 ug/mL Final   Performed at [facility] [address], [place], [place] [address]   B Natriuretic Peptide 02/17/2022 30.9  0.0 - 100.0 pg/mL Final   Performed at [facility] [address], [place], [place] [address]   Troponin I (High Sensitivity) 02/18/2022 4  <18 ng/L Final   Comment: (NOTE) Elevated high sensitivity troponin I (hsTnI) values and significant  changes across serial measurements may suggest ACS but many other  chronic and acute conditions are known to elevate hsTnI  results.  Refer to the "Links" section for chest pain algorithms and additional  guidance. Performed at [facility], [place] [address], [place], [place] [address]    D-Dimer, Quant 02/18/2022 0.29  0.00 - 0.50 ug/mL-FEU Final   Comment: (NOTE) At the manufacturer cut-off value of 0.5 g/mL FEU, this assay has a negative predictive value of 95-100%.This assay is intended for use in conjunction with a clinical pretest probability (PTP) assessment model to exclude pulmonary embolism (PE) and deep venous thrombosis (DVT) in outpatients suspected of PE or DVT. Results should be correlated with clinical presentation. Performed at [facility], [place] [address] [place], [place] [address]   Admission on 02/09/2022, Discharged on 02/17/2022  Component Date Value Ref Range Status   Glucose-Capillary 02/09/2022 118 (H)  70 - 99 mg/dL Final   Glucose reference range applies only to samples taken after fasting for at least 8 hours.   Glucose-Capillary 02/10/2022 148 (H)  70 - 99 mg/dL Final   Glucose reference range applies only to samples taken after fasting for at least 8 hours.   Glucose-Capillary 02/10/2022 174 (H)  70 - 99 mg/dL Final   Glucose reference range applies only to samples taken after fasting for at least 8 hours.  Glucose-Capillary 02/10/2022 128 (H)  70 - 99 mg/dL Final   Glucose reference range applies only to samples taken after fasting for at least 8 hours.   Glucose-Capillary 02/10/2022 182 (H)  70 - 99 mg/dL Final   Glucose reference range applies only to samples taken after fasting for at least 8 hours.   Glucose-Capillary 02/11/2022 158 (H)  70 - 99 mg/dL Final   Glucose reference range applies only to samples taken after fasting for at least 8 hours.   Glucose-Capillary 02/11/2022 159 (H)  70 - 99 mg/dL Final   Glucose reference range applies only to samples taken after fasting for at least 8 hours.   Glucose-Capillary 02/11/2022 153 (H)  70 - 99 mg/dL Final    Glucose reference range applies only to samples taken after fasting for at least 8 hours.   Glucose-Capillary 02/11/2022 159 (H)  70 - 99 mg/dL Final   Glucose reference range applies only to samples taken after fasting for at least 8 hours.   Glucose-Capillary 02/11/2022 153 (H)  70 - 99 mg/dL Final   Glucose reference range applies only to samples taken after fasting for at least 8 hours.   Glucose-Capillary 02/11/2022 109 (H)  70 - 99 mg/dL Final   Glucose reference range applies only to samples taken after fasting for at least 8 hours.   Glucose-Capillary 02/11/2022 213 (H)  70 - 99 mg/dL Final   Glucose reference range applies only to samples taken after fasting for at least 8 hours.   Glucose-Capillary 02/11/2022 229 (H)  70 - 99 mg/dL Final   Glucose reference range applies only to samples taken after fasting for at least 8 hours.   Glucose-Capillary 02/12/2022 128 (H)  70 - 99 mg/dL Final   Glucose reference range applies only to samples taken after fasting for at least 8 hours.   Glucose-Capillary 02/12/2022 149 (H)  70 - 99 mg/dL Final   Glucose reference range applies only to samples taken after fasting for at least 8 hours.   Color, Urine 02/12/2022 YELLOW  YELLOW Final   APPearance 02/12/2022 CLEAR  CLEAR Final   Specific Gravity, Urine 02/12/2022 1.015  1.005 - 1.030 Final   pH 02/12/2022 5.0  5.0 - 8.0 Final   Glucose, UA 02/12/2022 NEGATIVE  NEGATIVE mg/dL Final   Hgb urine dipstick 02/12/2022 NEGATIVE  NEGATIVE Final   Bilirubin Urine 02/12/2022 NEGATIVE  NEGATIVE Final   Ketones, ur 02/12/2022 NEGATIVE  NEGATIVE mg/dL Final   Protein, ur 02/12/2022 NEGATIVE  NEGATIVE mg/dL Final   Nitrite 02/12/2022 NEGATIVE  NEGATIVE Final   Leukocytes,Ua 02/12/2022 NEGATIVE  NEGATIVE Final   Performed at [facility], [place] [address] [place], [place] [address]   Specimen Description 02/12/2022 URINE, CLEAN CATCH   Final   Special Requests 02/12/2022    Final                    Value:NONE Performed at [facility], [place] [address] [place], [place] [address]    Culture 02/12/2022 10,000 COLONIES/mL PROTEUS MIRABILIS (A)   Final   Report Status 02/12/2022 02/15/2022 FINAL   Final   Organism ID, Bacteria 02/12/2022 PROTEUS MIRABILIS (A)   Final   Glucose-Capillary 02/12/2022 128 (H)  70 - 99 mg/dL Final   Glucose reference range applies only to samples taken after fasting for at least 8 hours.   Glucose-Capillary 02/12/2022 196 (H)  70 - 99 mg/dL Final   Glucose reference range  applies only to samples taken after fasting for at least 8 hours.   Glucose-Capillary 02/13/2022 168 (H)  70 - 99 mg/dL Final   Glucose reference range applies only to samples taken after fasting for at least 8 hours.   Glucose-Capillary 02/13/2022 153 (H)  70 - 99 mg/dL Final   Glucose reference range applies only to samples taken after fasting for at least 8 hours.   Glucose-Capillary 02/13/2022 134 (H)  70 - 99 mg/dL Final   Glucose reference range applies only to samples taken after fasting for at least 8 hours.   Glucose-Capillary 02/13/2022 178 (H)  70 - 99 mg/dL Final   Glucose reference range applies only to samples taken after fasting for at least 8 hours.   Glucose-Capillary 02/14/2022 156 (H)  70 - 99 mg/dL Final   Glucose reference range applies only to samples taken after fasting for at least 8 hours.   Glucose-Capillary 02/14/2022 169 (H)  70 - 99 mg/dL Final   Glucose reference range applies only to samples taken after fasting for at least 8 hours.   Glucose-Capillary 02/14/2022 156 (H)  70 - 99 mg/dL Final   Glucose reference range applies only to samples taken after fasting for at least 8 hours.   Glucose-Capillary 02/14/2022 148 (H)  70 - 99 mg/dL Final   Glucose reference range applies only to samples taken after fasting for at least 8 hours.   Glucose-Capillary 02/15/2022 159 (H)  70 - 99 mg/dL Final   Glucose reference range applies only to samples taken after  fasting for at least 8 hours.   Glucose-Capillary 02/15/2022 125 (H)  70 - 99 mg/dL Final   Glucose reference range applies only to samples taken after fasting for at least 8 hours.   Glucose-Capillary 02/15/2022 142 (H)  70 - 99 mg/dL Final   Glucose reference range applies only to samples taken after fasting for at least 8 hours.   Glucose-Capillary 02/15/2022 128 (H)  70 - 99 mg/dL Final   Glucose reference range applies only to samples taken after fasting for at least 8 hours.   Glucose-Capillary 02/16/2022 137 (H)  70 - 99 mg/dL Final   Glucose reference range applies only to samples taken after fasting for at least 8 hours.   WBC 02/16/2022 6.0  4.0 - 10.5 K/uL Final   RBC 02/16/2022 4.48  3.87 - 5.11 MIL/uL Final   Hemoglobin 02/16/2022 10.7 (L)  12.0 - 15.0 g/dL Final   HCT 02/16/2022 35.4 (L)  36.0 - 46.0 % Final   MCV 02/16/2022 79.0 (L)  80.0 - 100.0 fL Final   MCH 02/16/2022 23.9 (L)  26.0 - 34.0 pg Final   MCHC 02/16/2022 30.2  30.0 - 36.0 g/dL Final   RDW 02/16/2022 17.0 (H)  11.5 - 15.5 % Final   Platelets 02/16/2022 387  150 - 400 K/uL Final   nRBC 02/16/2022 0.0  0.0 - 0.2 % Final   Neutrophils Relative % 02/16/2022 54  % Final   Neutro Abs 02/16/2022 3.2  1.7 - 7.7 K/uL Final   Lymphocytes Relative 02/16/2022 35  % Final   Lymphs Abs 02/16/2022 2.1  0.7 - 4.0 K/uL Final   Monocytes Relative 02/16/2022 6  % Final   Monocytes Absolute 02/16/2022 0.4  0.1 - 1.0 K/uL Final   Eosinophils Relative 02/16/2022 2  % Final   Eosinophils Absolute 02/16/2022 0.1  0.0 - 0.5 K/uL Final   Basophils Relative 02/16/2022 2  % Final  Basophils Absolute 02/16/2022 0.1  0.0 - 0.1 K/uL Final   Immature Granulocytes 02/16/2022 1  % Final   Abs Immature Granulocytes 02/16/2022 0.04  0.00 - 0.07 K/uL Final   Performed at [facility], [place] [address] [place], [place] [address]   Glucose-Capillary 02/16/2022 131 (H)  70 - 99 mg/dL Final   Glucose reference range applies only to  samples taken after fasting for at least 8 hours.   Glucose-Capillary 02/16/2022 123 (H)  70 - 99 mg/dL Final   Glucose reference range applies only to samples taken after fasting for at least 8 hours.   Glucose-Capillary 02/16/2022 195 (H)  70 - 99 mg/dL Final   Glucose reference range applies only to samples taken after fasting for at least 8 hours.   Glucose-Capillary 02/17/2022 148 (H)  70 - 99 mg/dL Final   Glucose reference range applies only to samples taken after fasting for at least 8 hours.  Admission on 02/08/2022, Discharged on 02/09/2022  Component Date Value Ref Range Status   B Natriuretic Peptide 02/08/2022 40.2  0.0 - 100.0 pg/mL Final   Performed at [facility], [address] [place], [place] [address]   Troponin I (High Sensitivity) 02/08/2022 4  <18 ng/L Final   Comment: (NOTE) Elevated high sensitivity troponin I (hsTnI) values and significant  changes across serial measurements may suggest ACS but many other  chronic and acute conditions are known to elevate hsTnI results.  Refer to the "Links" section for chest pain algorithms and additional  guidance. Performed at [facility], [place] [address], [place], [place] [address]    WBC 02/08/2022 8.5  4.0 - 10.5 K/uL Final   RBC 02/08/2022 4.30  3.87 - 5.11 MIL/uL Final   Hemoglobin 02/08/2022 10.7 (L)  12.0 - 15.0 g/dL Final   HCT 02/08/2022 33.1 (L)  36.0 - 46.0 % Final   MCV 02/08/2022 77.0 (L)  80.0 - 100.0 fL Final   MCH 02/08/2022 24.9 (L)  26.0 - 34.0 pg Final   MCHC 02/08/2022 32.3  30.0 - 36.0 g/dL Final   RDW 02/08/2022 16.5 (H)  11.5 - 15.5 % Final   Platelets 02/08/2022 361  150 - 400 K/uL Final   nRBC 02/08/2022 0.0  0.0 - 0.2 % Final   Performed at [facility] [address], [place], [place] [address]   Sodium 02/08/2022 131 (L)  135 - 145 mmol/L Final   Potassium 02/08/2022 4.4  3.5 - 5.1 mmol/L Final   Chloride 02/08/2022 96 (L)  98 - 111 mmol/L Final   CO2 02/08/2022 25  22 -  32 mmol/L Final   Glucose, Bld 02/08/2022 126 (H)  70 - 99 mg/dL Final   Glucose reference range applies only to samples taken after fasting for at least 8 hours.   BUN 02/08/2022 11  6 - 20 mg/dL Final   Creatinine, Ser 02/08/2022 0.52  0.44 - 1.00 mg/dL Final   Calcium 02/08/2022 9.4  8.9 - 10.3 mg/dL Final   Total Protein 02/08/2022 7.5  6.5 - 8.1 g/dL Final   Albumin 02/08/2022 3.6  3.5 - 5.0 g/dL Final   AST 02/08/2022 22  15 - 41 U/L Final   ALT 02/08/2022 20  0 - 44 U/L Final   Alkaline Phosphatase 02/08/2022 67  38 - 126 U/L Final   Total Bilirubin 02/08/2022 0.1 (L)  0.3 - 1.2 mg/dL Final   GFR, Estimated 02/08/2022 >60  >60  mL/min Final   Comment: (NOTE) Calculated using the CKD-EPI Creatinine Equation (2021)    Anion gap 02/08/2022 10  5 - 15 Final   Performed at [facility], [place] [address], [place], [place] [address]   Troponin I (High Sensitivity) 02/08/2022 5  <18 ng/L Final   Comment: (NOTE) Elevated high sensitivity troponin I (hsTnI) values and significant  changes across serial measurements may suggest ACS but many other  chronic and acute conditions are known to elevate hsTnI results.  Refer to the "Links" section for chest pain algorithms and additional  guidance. Performed at [facility], [place] [address], [place], [place] [address]    WBC 02/09/2022 7.3  4.0 - 10.5 K/uL Final   RBC 02/09/2022 4.70  3.87 - 5.11 MIL/uL Final   Hemoglobin 02/09/2022 11.4 (L)  12.0 - 15.0 g/dL Final   HCT 02/09/2022 36.6  36.0 - 46.0 % Final   MCV 02/09/2022 77.9 (L)  80.0 - 100.0 fL Final   MCH 02/09/2022 24.3 (L)  26.0 - 34.0 pg Final   MCHC 02/09/2022 31.1  30.0 - 36.0 g/dL Final   RDW 02/09/2022 16.6 (H)  11.5 - 15.5 % Final   Platelets 02/09/2022 403 (H)  150 - 400 K/uL Final   nRBC 02/09/2022 0.0  0.0 - 0.2 % Final   Performed at [facility] [address], [place], [place] [address]   Glucose-Capillary 02/08/2022 117 (H)  70 - 99 mg/dL Final    Glucose reference range applies only to samples taken after fasting for at least 8 hours.   Sodium 02/09/2022 138  135 - 145 mmol/L Final   Potassium 02/09/2022 3.7  3.5 - 5.1 mmol/L Final   Chloride 02/09/2022 103  98 - 111 mmol/L Final   CO2 02/09/2022 24  22 - 32 mmol/L Final   Glucose, Bld 02/09/2022 134 (H)  70 - 99 mg/dL Final   Glucose reference range applies only to samples taken after fasting for at least 8 hours.   BUN 02/09/2022 9  6 - 20 mg/dL Final   Creatinine, Ser 02/09/2022 0.44  0.44 - 1.00 mg/dL Final   Calcium 02/09/2022 8.3 (L)  8.9 - 10.3 mg/dL Final   GFR, Estimated 02/09/2022 >60  >60 mL/min Final   Comment: (NOTE) Calculated using the CKD-EPI Creatinine Equation (2021)    Anion gap 02/09/2022 11  5 - 15 Final   Performed at [facility], [place] [address], [place], [place] [address]   Iron 02/09/2022 20 (L)  28 - 170 ug/dL Final   TIBC 02/09/2022 455 (H)  250 - 450 ug/dL Final   Saturation Ratios 02/09/2022 4 (L)  10.4 - 31.8 % Final   UIBC 02/09/2022 435  ug/dL Final   Performed at [facility], [place] [address] [place], [place] [address]   Ferritin 02/09/2022 2 (L)  11 - 307 ng/mL Final   Performed at [facility] [address], [place],  [address]   Weight 02/09/2022 2,846.58  oz Final   Height 02/09/2022 62  in Final   BP 02/09/2022 143/80  mmHg Final   WBC 02/09/2022 8.1  4.0 - 10.5 K/uL Final   RBC 02/09/2022 4.38  3.87 - 5.11 MIL/uL Final   Hemoglobin 02/09/2022 10.9 (L)  12.0 - 15.0 g/dL Final   HCT 02/09/2022 33.3 (L)  36.0 - 46.0 % Final   MCV 02/09/2022 76.0 (L)  80.0 - 100.0 fL  Final   MCH 02/09/2022 24.9 (L)  26.0 - 34.0 pg Final   MCHC 02/09/2022 32.7  30.0 - 36.0 g/dL Final   RDW 02/09/2022 16.5 (H)  11.5 - 15.5 % Final   Platelets 02/09/2022 397  150 - 400 K/uL Final   nRBC 02/09/2022 0.0  0.0 - 0.2 % Final   Neutrophils Relative % 02/09/2022 66  % Final   Neutro Abs 02/09/2022 5.4  1.7 - 7.7 K/uL Final   Lymphocytes  Relative 02/09/2022 24  % Final   Lymphs Abs 02/09/2022 1.9  0.7 - 4.0 K/uL Final   Monocytes Relative 02/09/2022 8  % Final   Monocytes Absolute 02/09/2022 0.6  0.1 - 1.0 K/uL Final   Eosinophils Relative 02/09/2022 1  % Final   Eosinophils Absolute 02/09/2022 0.1  0.0 - 0.5 K/uL Final   Basophils Relative 02/09/2022 1  % Final   Basophils Absolute 02/09/2022 0.1  0.0 - 0.1 K/uL Final   Immature Granulocytes 02/09/2022 0  % Final   Abs Immature Granulocytes 02/09/2022 0.03  0.00 - 0.07 K/uL Final   Performed at [facility], [place] [address] [place], [place] [address]   Glucose-Capillary 02/09/2022 238 (H)  70 - 99 mg/dL Final   Glucose reference range applies only to samples taken after fasting for at least 8 hours.   Glucose-Capillary 02/09/2022 91  70 - 99 mg/dL Final   Glucose reference range applies only to samples taken after fasting for at least 8 hours.   CRP 02/09/2022 <0.5  <1.0 mg/dL Final   Performed at [facility] [address], [place], [place] [address]   Sed Rate 02/09/2022 15  0 - 22 mm/hr Final   Performed at [facility] [address] [place], [place] [address]   Glucose-Capillary 02/09/2022 137 (H)  70 - 99 mg/dL Final   Glucose reference range applies only to samples taken after fasting for at least 8 hours.  Admission on 01/24/2022, Discharged on 02/08/2022  Component Date Value Ref Range Status   Glucose-Capillary 01/24/2022 143 (H)  70 - 99 mg/dL Final   Glucose reference range applies only to samples taken after fasting for at least 8 hours.   Glucose-Capillary 01/24/2022 120 (H)  70 - 99 mg/dL Final   Glucose reference range applies only to samples taken after fasting for at least 8 hours.   Glucose-Capillary 01/25/2022 122 (H)  70 - 99 mg/dL Final   Glucose reference range applies only to samples taken after fasting for at least 8 hours.   Glucose-Capillary 01/25/2022 190 (H)  70 - 99 mg/dL Final   Glucose reference range applies only  to samples taken after fasting for at least 8 hours.   Glucose-Capillary 01/25/2022 163 (H)  70 - 99 mg/dL Final   Glucose reference range applies only to samples taken after fasting for at least 8 hours.   WBC 01/26/2022 6.5  4.0 - 10.5 K/uL Final   RBC 01/26/2022 4.53  3.87 - 5.11 MIL/uL Final   Hemoglobin 01/26/2022 11.3 (L)  12.0 - 15.0 g/dL Final   HCT 01/26/2022 36.4  36.0 - 46.0 % Final   MCV 01/26/2022 80.4  80.0 - 100.0 fL Final   MCH 01/26/2022 24.9 (L)  26.0 - 34.0 pg Final   MCHC 01/26/2022 31.0  30.0 - 36.0 g/dL Final   RDW 01/26/2022 17.3 (H)  11.5 - 15.5 % Final   Platelets 01/26/2022 327  150 - 400 K/uL  Final   nRBC 01/26/2022 0.0  0.0 - 0.2 % Final   Neutrophils Relative % 01/26/2022 60  % Final   Neutro Abs 01/26/2022 3.9  1.7 - 7.7 K/uL Final   Lymphocytes Relative 01/26/2022 31  % Final   Lymphs Abs 01/26/2022 2.0  0.7 - 4.0 K/uL Final   Monocytes Relative 01/26/2022 7  % Final   Monocytes Absolute 01/26/2022 0.5  0.1 - 1.0 K/uL Final   Eosinophils Relative 01/26/2022 1  % Final   Eosinophils Absolute 01/26/2022 0.1  0.0 - 0.5 K/uL Final   Basophils Relative 01/26/2022 1  % Final   Basophils Absolute 01/26/2022 0.1  0.0 - 0.1 K/uL Final   Immature Granulocytes 01/26/2022 0  % Final   Abs Immature Granulocytes 01/26/2022 0.02  0.00 - 0.07 K/uL Final   Performed at [facility], [place] [address] [place], [place] [address]   Glucose-Capillary 01/26/2022 149 (H)  70 - 99 mg/dL Final   Glucose reference range applies only to samples taken after fasting for at least 8 hours.   Glucose-Capillary 01/26/2022 130 (H)  70 - 99 mg/dL Final   Glucose reference range applies only to samples taken after fasting for at least 8 hours.   Glucose-Capillary 01/26/2022 151 (H)  70 - 99 mg/dL Final   Glucose reference range applies only to samples taken after fasting for at least 8 hours.   Glucose-Capillary 01/26/2022 170 (H)  70 - 99 mg/dL Final   Glucose reference range applies  only to samples taken after fasting for at least 8 hours.   Glucose-Capillary 01/27/2022 129 (H)  70 - 99 mg/dL Final   Glucose reference range applies only to samples taken after fasting for at least 8 hours.   Glucose-Capillary 01/27/2022 101 (H)  70 - 99 mg/dL Final   Glucose reference range applies only to samples taken after fasting for at least 8 hours.   Glucose-Capillary 01/27/2022 220 (H)  70 - 99 mg/dL Final   Glucose reference range applies only to samples taken after fasting for at least 8 hours.   Glucose-Capillary 01/27/2022 154 (H)  70 - 99 mg/dL Final   Glucose reference range applies only to samples taken after fasting for at least 8 hours.   Glucose-Capillary 01/27/2022 163 (H)  70 - 99 mg/dL Final   Glucose reference range applies only to samples taken after fasting for at least 8 hours.   Glucose-Capillary 01/28/2022 127 (H)  70 - 99 mg/dL Final   Glucose reference range applies only to samples taken after fasting for at least 8 hours.   Glucose-Capillary 01/28/2022 110 (H)  70 - 99 mg/dL Final   Glucose reference range applies only to samples taken after fasting for at least 8 hours.   Glucose-Capillary 01/28/2022 167 (H)  70 - 99 mg/dL Final   Glucose reference range applies only to samples taken after fasting for at least 8 hours.   Glucose-Capillary 01/29/2022 145 (H)  70 - 99 mg/dL Final   Glucose reference range applies only to samples taken after fasting for at least 8 hours.   Glucose-Capillary 01/29/2022 203 (H)  70 - 99 mg/dL Final   Glucose reference range applies only to samples taken after fasting for at least 8 hours.   Glucose-Capillary 01/29/2022 153 (H)  70 - 99 mg/dL Final   Glucose reference range applies only to samples taken after fasting for at least 8 hours.   Glucose-Capillary 01/29/2022 166 (H)  70 -  99 mg/dL Final   Glucose reference range applies only to samples taken after fasting for at least 8 hours.   Glucose-Capillary 01/30/2022 142 (H)  70  - 99 mg/dL Final   Glucose reference range applies only to samples taken after fasting for at least 8 hours.   Glucose-Capillary 01/30/2022 138 (H)  70 - 99 mg/dL Final   Glucose reference range applies only to samples taken after fasting for at least 8 hours.   Glucose-Capillary 01/30/2022 185 (H)  70 - 99 mg/dL Final   Glucose reference range applies only to samples taken after fasting for at least 8 hours.   Glucose-Capillary 01/30/2022 220 (H)  70 - 99 mg/dL Final   Glucose reference range applies only to samples taken after fasting for at least 8 hours.   Glucose-Capillary 01/31/2022 147 (H)  70 - 99 mg/dL Final   Glucose reference range applies only to samples taken after fasting for at least 8 hours.   Glucose-Capillary 01/31/2022 131 (H)  70 - 99 mg/dL Final   Glucose reference range applies only to samples taken after fasting for at least 8 hours.   Glucose-Capillary 01/31/2022 169 (H)  70 - 99 mg/dL Final   Glucose reference range applies only to samples taken after fasting for at least 8 hours.   Glucose-Capillary 01/31/2022 144 (H)  70 - 99 mg/dL Final   Glucose reference range applies only to samples taken after fasting for at least 8 hours.   Comment 1 01/31/2022 RN AWARE   Final   Glucose-Capillary 02/01/2022 117 (H)  70 - 99 mg/dL Final   Glucose reference range applies only to samples taken after fasting for at least 8 hours.   Glucose-Capillary 02/01/2022 180 (H)  70 - 99 mg/dL Final   Glucose reference range applies only to samples taken after fasting for at least 8 hours.   Glucose-Capillary 02/01/2022 209 (H)  70 - 99 mg/dL Final   Glucose reference range applies only to samples taken after fasting for at least 8 hours.   Glucose-Capillary 02/02/2022 131 (H)  70 - 99 mg/dL Final   Glucose reference range applies only to samples taken after fasting for at least 8 hours.   WBC 02/02/2022 7.5  4.0 - 10.5 K/uL Final   RBC 02/02/2022 4.52  3.87 - 5.11 MIL/uL Final    Hemoglobin 02/02/2022 11.3 (L)  12.0 - 15.0 g/dL Final   HCT 02/02/2022 35.0 (L)  36.0 - 46.0 % Final   MCV 02/02/2022 77.4 (L)  80.0 - 100.0 fL Final   MCH 02/02/2022 25.0 (L)  26.0 - 34.0 pg Final   MCHC 02/02/2022 32.3  30.0 - 36.0 g/dL Final   RDW 02/02/2022 16.7 (H)  11.5 - 15.5 % Final   Platelets 02/02/2022 345  150 - 400 K/uL Final   nRBC 02/02/2022 0.0  0.0 - 0.2 % Final   Neutrophils Relative % 02/02/2022 63  % Final   Neutro Abs 02/02/2022 4.7  1.7 - 7.7 K/uL Final   Lymphocytes Relative 02/02/2022 28  % Final   Lymphs Abs 02/02/2022 2.1  0.7 - 4.0 K/uL Final   Monocytes Relative 02/02/2022 7  % Final   Monocytes Absolute 02/02/2022 0.5  0.1 - 1.0 K/uL Final   Eosinophils Relative 02/02/2022 1  % Final   Eosinophils Absolute 02/02/2022 0.1  0.0 - 0.5 K/uL Final   Basophils Relative 02/02/2022 1  % Final   Basophils Absolute 02/02/2022 0.1  0.0 - 0.1 K/uL  Final   Immature Granulocytes 02/02/2022 0  % Final   Abs Immature Granulocytes 02/02/2022 0.03  0.00 - 0.07 K/uL Final   Performed at [facility], [place] [address], [place],  [address]   Glucose-Capillary 02/02/2022 95  70 - 99 mg/dL Final   Glucose reference range applies only to samples taken after fasting for at least 8 hours.   Glucose-Capillary 02/02/2022 120 (H)  70 - 99 mg/dL Final   Glucose reference range applies only to samples taken after fasting for at least 8 hours.   Glucose-Capillary 02/02/2022 191 (H)  70 - 99 mg/dL Final   Glucose reference range applies only to samples taken after fasting for at least 8 hours.   Glucose-Capillary 02/03/2022 148 (H)  70 - 99 mg/dL Final   Glucose reference range applies only to samples taken after fasting for at least 8 hours.   Glucose-Capillary 02/03/2022 122 (H)  70 - 99 mg/dL Final   Glucose reference range applies only to samples taken after fasting for at least 8 hours.   Glucose-Capillary 02/03/2022 233 (H)  70 - 99 mg/dL Final   Glucose reference range  applies only to samples taken after fasting for at least 8 hours.   Glucose-Capillary 02/04/2022 126 (H)  70 - 99 mg/dL Final   Glucose reference range applies only to samples taken after fasting for at least 8 hours.   Glucose-Capillary 02/04/2022 117 (H)  70 - 99 mg/dL Final   Glucose reference range applies only to samples taken after fasting for at least 8 hours.   Glucose-Capillary 02/04/2022 135 (H)  70 - 99 mg/dL Final   Glucose reference range applies only to samples taken after fasting for at least 8 hours.   Glucose-Capillary 02/04/2022 197 (H)  70 - 99 mg/dL Final   Glucose reference range applies only to samples taken after fasting for at least 8 hours.   Glucose-Capillary 02/05/2022 170 (H)  70 - 99 mg/dL Final   Glucose reference range applies only to samples taken after fasting for at least 8 hours.   Glucose-Capillary 02/05/2022 107 (H)  70 - 99 mg/dL Final   Glucose reference range applies only to samples taken after fasting for at least 8 hours.   Glucose-Capillary 02/05/2022 184 (H)  70 - 99 mg/dL Final   Glucose reference range applies only to samples taken after fasting for at least 8 hours.   Glucose-Capillary 02/05/2022 256 (H)  70 - 99 mg/dL Final   Glucose reference range applies only to samples taken after fasting for at least 8 hours.   Glucose-Capillary 02/06/2022 144 (H)  70 - 99 mg/dL Final   Glucose reference range applies only to samples taken after fasting for at least 8 hours.   Glucose-Capillary 02/06/2022 190 (H)  70 - 99 mg/dL Final   Glucose reference range applies only to samples taken after fasting for at least 8 hours.   Glucose-Capillary 02/06/2022 92  70 - 99 mg/dL Final   Glucose reference range applies only to samples taken after fasting for at least 8 hours.   Trichomonas 02/06/2022 Negative   Final   Bacterial Vaginitis-Urine 02/06/2022 Negative   Final   Molecular Comment 02/06/2022 For tests bacteria and/or candida, this specimen does not meet  the   Final   Molecular Comment 02/06/2022 strict criteria set by the FDA. The result interpretation should be   Final   Molecular Comment 02/06/2022 considered in conjunction with the patient's clinical history.  Final   Comment 02/06/2022 Normal Reference Range Trichomonas - Negative   Final   Glucose-Capillary 02/06/2022 197 (H)  70 - 99 mg/dL Final   Glucose reference range applies only to samples taken after fasting for at least 8 hours.   Glucose-Capillary 02/07/2022 129 (H)  70 - 99 mg/dL Final   Glucose reference range applies only to samples taken after fasting for at least 8 hours.   Glucose-Capillary 02/07/2022 207 (H)  70 - 99 mg/dL Final   Glucose reference range applies only to samples taken after fasting for at least 8 hours.   Glucose-Capillary 02/07/2022 153 (H)  70 - 99 mg/dL Final   Glucose reference range applies only to samples taken after fasting for at least 8 hours.   Glucose-Capillary 02/08/2022 129 (H)  70 - 99 mg/dL Final   Glucose reference range applies only to samples taken after fasting for at least 8 hours.   Glucose-Capillary 02/08/2022 107 (H)  70 - 99 mg/dL Final   Glucose reference range applies only to samples taken after fasting for at least 8 hours.  Admission on 01/23/2022, Discharged on 01/24/2022  Component Date Value Ref Range Status   Sodium 01/23/2022 140  135 - 145 mmol/L Final   Potassium 01/23/2022 4.4  3.5 - 5.1 mmol/L Final   Chloride 01/23/2022 101  98 - 111 mmol/L Final   CO2 01/23/2022 25  22 - 32 mmol/L Final   Glucose, Bld 01/23/2022 198 (H)  70 - 99 mg/dL Final   Glucose reference range applies only to samples taken after fasting for at least 8 hours.   BUN 01/23/2022 13  6 - 20 mg/dL Final   Creatinine, Ser 01/23/2022 0.62  0.44 - 1.00 mg/dL Final   Calcium 01/23/2022 9.3  8.9 - 10.3 mg/dL Final   GFR, Estimated 01/23/2022 >60  >60 mL/min Final   Comment: (NOTE) Calculated using the CKD-EPI Creatinine Equation (2021)    Anion  gap 01/23/2022 14  5 - 15 Final   Performed at [facility], [place] [address] [place], [place] [address]   WBC 01/23/2022 5.8  4.0 - 10.5 K/uL Final   RBC 01/23/2022 4.63  3.87 - 5.11 MIL/uL Final   Hemoglobin 01/23/2022 11.7 (L)  12.0 - 15.0 g/dL Final   HCT 01/23/2022 37.4  36.0 - 46.0 % Final   MCV 01/23/2022 80.8  80.0 - 100.0 fL Final   MCH 01/23/2022 25.3 (L)  26.0 - 34.0 pg Final   MCHC 01/23/2022 31.3  30.0 - 36.0 g/dL Final   RDW 01/23/2022 17.9 (H)  11.5 - 15.5 % Final   Platelets 01/23/2022 333  150 - 400 K/uL Final   nRBC 01/23/2022 0.0  0.0 - 0.2 % Final   Performed at [facility] [address], [place], [place] [address]   Troponin I (High Sensitivity) 01/23/2022 6  <18 ng/L Final   Comment: (NOTE) Elevated high sensitivity troponin I (hsTnI) values and significant  changes across serial measurements may suggest ACS but many other  chronic and acute conditions are known to elevate hsTnI results.  Refer to the "Links" section for chest pain algorithms and additional  guidance. Performed at [facility], [place] [address], [place], [place] [address]    Troponin I (High Sensitivity) 01/23/2022 5  <18 ng/L Final   Comment: (NOTE) Elevated high sensitivity troponin I (hsTnI) values and significant  changes across serial measurements may suggest ACS but many other  chronic  and acute conditions are known to elevate hsTnI results.  Refer to the "Links" section for chest pain algorithms and additional  guidance. Performed at [facility], [place] [address] [place], [place] [address]    SARS Coronavirus 2 by RT PCR 01/23/2022 NEGATIVE  NEGATIVE Final   Comment: (NOTE) SARS-CoV-2 target nucleic acids are NOT DETECTED.  The SARS-CoV-2 RNA is generally detectable in upper and lower respiratory specimens during the acute phase of infection. The lowest concentration of SARS-CoV-2 viral copies this assay can detect is 250 copies / mL. A negative result does  not preclude SARS-CoV-2 infection and should not be used as the sole basis for treatment or other patient management decisions.  A negative result may occur with improper specimen collection / handling, submission of specimen other than nasopharyngeal swab, presence of viral mutation(s) within the areas targeted by this assay, and inadequate number of viral copies (<250 copies / mL). A negative result must be combined with clinical observations, patient history, and epidemiological information.  Fact Sheet for Patients:   https://www.patel.info/  Fact Sheet for Healthcare Providers: https://hall.com/  This test is not yet approved or                           cleared by the [place] FDA and has been authorized for detection and/or diagnosis of SARS-CoV-2 by FDA under an Emergency Use Authorization (EUA).  This EUA will remain in effect (meaning this test can be used) for the duration of the COVID-19 declaration under Section 564(b)(1) of the Act, 21 U.S.C. section 360bbb-3(b)(1), unless the authorization is terminated or revoked sooner.  Performed at [facility], [place] [address], [place], [place] [address]    B Natriuretic Peptide 01/23/2022 41.3  0.0 - 100.0 pg/mL Final   Performed at [facility] [address], [place], [place] [address]   Group A Strep by PCR 01/23/2022 NOT DETECTED  NOT DETECTED Final   Performed at [facility], [place] [address] [place], [place] [address]   Color, Urine 01/23/2022 YELLOW  YELLOW Final   APPearance 01/23/2022 CLEAR  CLEAR Final   Specific Gravity, Urine 01/23/2022 1.018  1.005 - 1.030 Final   pH 01/23/2022 7.0  5.0 - 8.0 Final   Glucose, UA 01/23/2022 NEGATIVE  NEGATIVE mg/dL Final   Hgb urine dipstick 01/23/2022 NEGATIVE  NEGATIVE Final   Bilirubin Urine 01/23/2022 NEGATIVE  NEGATIVE Final   Ketones, ur 01/23/2022 NEGATIVE  NEGATIVE mg/dL Final   Protein, ur 01/23/2022  NEGATIVE  NEGATIVE mg/dL Final   Nitrite 01/23/2022 NEGATIVE  NEGATIVE Final   Leukocytes,Ua 01/23/2022 TRACE (A)  NEGATIVE Final   RBC / HPF 01/23/2022 0-5  0 - 5 RBC/hpf Final   WBC, UA 01/23/2022 0-5  0 - 5 WBC/hpf Final   Bacteria, UA 01/23/2022 NONE SEEN  NONE SEEN Final   Squamous Epithelial / LPF 01/23/2022 0-5  0 - 5 Final   Mucus 01/23/2022 PRESENT   Final   Performed at [facility], [place] [address] [place], [place] [address]   SARS Coronavirus 2 by RT PCR 01/23/2022 NEGATIVE  NEGATIVE Final   Comment: (NOTE) SARS-CoV-2 target nucleic acids are NOT DETECTED.  The SARS-CoV-2 RNA is generally detectable in upper respiratory specimens during the acute phase of infection. The lowest concentration of SARS-CoV-2 viral copies this assay can detect is 138 copies/mL. A negative result does not preclude SARS-Cov-2 infection and  should not be used as the sole basis for treatment or other patient management decisions. A negative result may occur with  improper specimen collection/handling, submission of specimen other than nasopharyngeal swab, presence of viral mutation(s) within the areas targeted by this assay, and inadequate number of viral copies(<138 copies/mL). A negative result must be combined with clinical observations, patient history, and epidemiological information. The expected result is Negative.  Fact Sheet for Patients:  EntrepreneurPulse.com.au  Fact Sheet for Healthcare Providers:  IncredibleEmployment.be  This test is no                          t yet approved or cleared by the [place] FDA and  has been authorized for detection and/or diagnosis of SARS-CoV-2 by FDA under an Emergency Use Authorization (EUA). This EUA will remain  in effect (meaning this test can be used) for the duration of the COVID-19 declaration under Section 564(b)(1) of the Act, 21 U.S.C.section 360bbb-3(b)(1), unless the authorization is  terminated  or revoked sooner.       Influenza A by PCR 01/23/2022 NEGATIVE  NEGATIVE Final   Influenza B by PCR 01/23/2022 NEGATIVE  NEGATIVE Final   Comment: (NOTE) The Xpert Xpress SARS-CoV-2/FLU/RSV plus assay is intended as an aid in the diagnosis of influenza from Nasopharyngeal swab specimens and should not be used as a sole basis for treatment. Nasal washings and aspirates are unacceptable for Xpert Xpress SARS-CoV-2/FLU/RSV testing.  Fact Sheet for Patients: EntrepreneurPulse.com.au  Fact Sheet for Healthcare Providers: IncredibleEmployment.be  This test is not yet approved or cleared by the [place] FDA and has been authorized for detection and/or diagnosis of SARS-CoV-2 by FDA under an Emergency Use Authorization (EUA). This EUA will remain in effect (meaning this test can be used) for the duration of the COVID-19 declaration under Section 564(b)(1) of the Act, 21 U.S.C. section 360bbb-3(b)(1), unless the authorization is terminated or revoked.  Performed at [facility], [place] [address], [place], [place] [address]    WBC 01/24/2022 6.1  4.0 - 10.5 K/uL Final   RBC 01/24/2022 4.60  3.87 - 5.11 MIL/uL Final   Hemoglobin 01/24/2022 11.7 (L)  12.0 - 15.0 g/dL Final   HCT 01/24/2022 37.0  36.0 - 46.0 % Final   MCV 01/24/2022 80.4  80.0 - 100.0 fL Final   MCH 01/24/2022 25.4 (L)  26.0 - 34.0 pg Final   MCHC 01/24/2022 31.6  30.0 - 36.0 g/dL Final   RDW 01/24/2022 17.6 (H)  11.5 - 15.5 % Final   Platelets 01/24/2022 334  150 - 400 K/uL Final   nRBC 01/24/2022 0.0  0.0 - 0.2 % Final   Neutrophils Relative % 01/24/2022 53  % Final   Neutro Abs 01/24/2022 3.3  1.7 - 7.7 K/uL Final   Lymphocytes Relative 01/24/2022 33  % Final   Lymphs Abs 01/24/2022 2.0  0.7 - 4.0 K/uL Final   Monocytes Relative 01/24/2022 11  % Final   Monocytes Absolute 01/24/2022 0.7  0.1 - 1.0 K/uL Final   Eosinophils Relative 01/24/2022 2  % Final    Eosinophils Absolute 01/24/2022 0.1  0.0 - 0.5 K/uL Final   Basophils Relative 01/24/2022 1  % Final   Basophils Absolute 01/24/2022 0.1  0.0 - 0.1 K/uL Final   Immature Granulocytes 01/24/2022 0  % Final   Abs Immature Granulocytes 01/24/2022 0.02  0.00 - 0.07 K/uL Final   Performed at [facility],  [address] [place], [place] [address]   Glucose-Capillary 01/24/2022 110 (H)  70 - 99 mg/dL Final   Glucose reference range applies only to samples taken after fasting for at least 8 hours.  No results displayed because visit has over 200 results.    Admission on 11/22/2021, Discharged on 11/22/2021  Component Date Value Ref Range Status   Glucose-Capillary 11/22/2021 169 (H)  70 - 99 mg/dL Final   Glucose reference range applies only to samples taken after fasting for at least 8 hours.  No results displayed because visit has over 200 results.    Admission on 10/04/2021, Discharged on 10/22/2021  Component Date Value Ref Range Status   SARS Coronavirus 2 by RT PCR 10/04/2021 NEGATIVE  NEGATIVE Final   Comment: (NOTE) SARS-CoV-2 target nucleic acids are NOT DETECTED.  The SARS-CoV-2 RNA is generally detectable in upper respiratory specimens during the acute phase of infection. The lowest concentration of SARS-CoV-2 viral copies this assay can detect is 138 copies/mL. A negative result does not preclude SARS-Cov-2 infection and should not be used as the sole basis for treatment or other patient management decisions. A negative result may occur with  improper specimen collection/handling, submission of specimen other than nasopharyngeal swab, presence of viral mutation(s) within the areas targeted by this assay, and inadequate number of viral copies(<138 copies/mL). A negative result must be combined with clinical observations, patient history, and epidemiological information. The expected result is Negative.  Fact Sheet for Patients:   EntrepreneurPulse.com.au  Fact Sheet for Healthcare Providers:  IncredibleEmployment.be  This test is no                          t yet approved or cleared by the [place] FDA and  has been authorized for detection and/or diagnosis of SARS-CoV-2 by FDA under an Emergency Use Authorization (EUA). This EUA will remain  in effect (meaning this test can be used) for the duration of the COVID-19 declaration under Section 564(b)(1) of the Act, 21 U.S.C.section 360bbb-3(b)(1), unless the authorization is terminated  or revoked sooner.       Influenza A by PCR 10/04/2021 NEGATIVE  NEGATIVE Final   Influenza B by PCR 10/04/2021 NEGATIVE  NEGATIVE Final   Comment: (NOTE) The Xpert Xpress SARS-CoV-2/FLU/RSV plus assay is intended as an aid in the diagnosis of influenza from Nasopharyngeal swab specimens and should not be used as a sole basis for treatment. Nasal washings and aspirates are unacceptable for Xpert Xpress SARS-CoV-2/FLU/RSV testing.  Fact Sheet for Patients: EntrepreneurPulse.com.au  Fact Sheet for Healthcare Providers: IncredibleEmployment.be  This test is not yet approved or cleared by the [place] FDA and has been authorized for detection and/or diagnosis of SARS-CoV-2 by FDA under an Emergency Use Authorization (EUA). This EUA will remain in effect (meaning this test can be used) for the duration of the COVID-19 declaration under Section 564(b)(1) of the Act, 21 U.S.C. section 360bbb-3(b)(1), unless the authorization is terminated or revoked.  Performed at [facility], [place] [address], [place], [place] [address]    WBC 10/04/2021 8.4  4.0 - 10.5 K/uL Final   RBC 10/04/2021 4.42  3.87 - 5.11 MIL/uL Final   Hemoglobin 10/04/2021 11.6 (L)  12.0 - 15.0 g/dL Final   HCT 10/04/2021 35.7 (L)  36.0 - 46.0 % Final   MCV 10/04/2021 80.8  80.0 - 100.0 fL Final   MCH 10/04/2021 26.2  26.0  - 34.0 pg Final  MCHC 10/04/2021 32.5  30.0 - 36.0 g/dL Final   RDW 10/04/2021 16.0 (H)  11.5 - 15.5 % Final   Platelets 10/04/2021 372  150 - 400 K/uL Final   nRBC 10/04/2021 0.0  0.0 - 0.2 % Final   Neutrophils Relative % 10/04/2021 68  % Final   Neutro Abs 10/04/2021 5.7  1.7 - 7.7 K/uL Final   Lymphocytes Relative 10/04/2021 22  % Final   Lymphs Abs 10/04/2021 1.8  0.7 - 4.0 K/uL Final   Monocytes Relative 10/04/2021 8  % Final   Monocytes Absolute 10/04/2021 0.7  0.1 - 1.0 K/uL Final   Eosinophils Relative 10/04/2021 1  % Final   Eosinophils Absolute 10/04/2021 0.1  0.0 - 0.5 K/uL Final   Basophils Relative 10/04/2021 1  % Final   Basophils Absolute 10/04/2021 0.1  0.0 - 0.1 K/uL Final   Immature Granulocytes 10/04/2021 0  % Final   Abs Immature Granulocytes 10/04/2021 0.03  0.00 - 0.07 K/uL Final   Performed at [facility], [place] [address], [place], [place] [address]   Sodium 10/04/2021 136  135 - 145 mmol/L Final   Potassium 10/04/2021 4.2  3.5 - 5.1 mmol/L Final   Chloride 10/04/2021 104  98 - 111 mmol/L Final   CO2 10/04/2021 25  22 - 32 mmol/L Final   Glucose, Bld 10/04/2021 100 (H)  70 - 99 mg/dL Final   Glucose reference range applies only to samples taken after fasting for at least 8 hours.   BUN 10/04/2021 9  6 - 20 mg/dL Final   Creatinine, Ser 10/04/2021 0.52  0.44 - 1.00 mg/dL Final   Calcium 10/04/2021 9.0  8.9 - 10.3 mg/dL Final   Total Protein 10/04/2021 7.0  6.5 - 8.1 g/dL Final   Albumin 10/04/2021 3.2 (L)  3.5 - 5.0 g/dL Final   AST 10/04/2021 13 (L)  15 - 41 U/L Final   ALT 10/04/2021 10  0 - 44 U/L Final   Alkaline Phosphatase 10/04/2021 61  38 - 126 U/L Final   Total Bilirubin 10/04/2021 0.3  0.3 - 1.2 mg/dL Final   GFR, Estimated 10/04/2021 >60  >60 mL/min Final   Comment: (NOTE) Calculated using the CKD-EPI Creatinine Equation (2021)    Anion gap 10/04/2021 7  5 - 15 Final   Performed at [facility] [address], [place], [place]  [address]   Hgb A1c MFr Bld 10/04/2021 7.0 (H)  4.8 - 5.6 % Final   Comment: (NOTE) Pre diabetes:          5.7%-6.4%  Diabetes:              >6.4%  Glycemic control for   <7.0% adults with diabetes    Mean Plasma Glucose 10/04/2021 154.2  mg/dL Final   Performed at [facility], [place] [address], [place], [place] [address]   Cholesterol 10/04/2021 178  0 - 200 mg/dL Final   Triglycerides 10/04/2021 155 (H)  <150 mg/dL Final   HDL 10/04/2021 52  >40 mg/dL Final   Total CHOL/HDL Ratio 10/04/2021 3.4  RATIO Final   VLDL 10/04/2021 31  0 - 40 mg/dL Final   LDL Cholesterol 10/04/2021 95  0 - 99 mg/dL Final   Comment:        Total Cholesterol/HDL:CHD Risk Coronary Heart Disease Risk Table                     Men  Women  1/2 Average Risk   3.4   3.3  Average Risk       5.0   4.4  2 X Average Risk   9.6   7.1  3 X Average Risk  23.4   11.0        Use the calculated Patient Ratio above and the CHD Risk Table to determine the patient's CHD Risk.        ATP III CLASSIFICATION (LDL):  <100     mg/dL   Optimal  100-129  mg/dL   Near or Above                    Optimal  130-159  mg/dL   Borderline  160-189  mg/dL   High  >190     mg/dL   Very High Performed at [facility] [address], [place], [place] [address]    POC Amphetamine UR 10/04/2021 None Detected  NONE DETECTED (Cut Off Level 1000 ng/mL) Final   POC Secobarbital (BAR) 10/04/2021 None Detected  NONE DETECTED (Cut Off Level 300 ng/mL) Final   POC Buprenorphine (BUP) 10/04/2021 None Detected  NONE DETECTED (Cut Off Level 10 ng/mL) Final   POC Oxazepam (BZO) 10/04/2021 None Detected  NONE DETECTED (Cut Off Level 300 ng/mL) Final   POC Cocaine UR 10/04/2021 None Detected  NONE DETECTED (Cut Off Level 300 ng/mL) Final   POC Methamphetamine UR 10/04/2021 None Detected  NONE DETECTED (Cut Off Level 1000 ng/mL) Final   POC Morphine 10/04/2021 None Detected  NONE DETECTED (Cut Off Level 300 ng/mL) Final   POC Methadone  UR 10/04/2021 None Detected  NONE DETECTED (Cut Off Level 300 ng/mL) Final   POC Oxycodone UR 10/04/2021 Positive (A)  NONE DETECTED (Cut Off Level 100 ng/mL) Final   POC Marijuana UR 10/04/2021 None Detected  NONE DETECTED (Cut Off Level 50 ng/mL) Final   SARSCOV2ONAVIRUS 2 AG 10/04/2021 NEGATIVE  NEGATIVE Final   Comment: (NOTE) SARS-CoV-2 antigen NOT DETECTED.   Negative results are presumptive.  Negative results do not preclude SARS-CoV-2 infection and should not be used as the sole basis for treatment or other patient management decisions, including infection  control decisions, particularly in the presence of clinical signs and  symptoms consistent with COVID-19, or in those who have been in contact with the virus.  Negative results must be combined with clinical observations, patient history, and epidemiological information. The expected result is Negative.  Fact Sheet for Patients: HandmadeRecipes.com.cy  Fact Sheet for Healthcare Providers: FuneralLife.at  This test is not yet approved or cleared by the [place] FDA and  has been authorized for detection and/or diagnosis of SARS-CoV-2 by FDA under an Emergency Use Authorization (EUA).  This EUA will remain in effect (meaning this test can be used) for the duration of  the COV                          ID-19 declaration under Section 564(b)(1) of the Act, 21 U.S.C. section 360bbb-3(b)(1), unless the authorization is terminated or revoked sooner.     Valproic Acid Lvl 10/04/2021 51  50.0 - 100.0 ug/mL Final   Performed at [facility] [address] [place], [place] [address]   Valproic Acid Lvl 10/08/2021 57  50.0 - 100.0 ug/mL Final   Performed at [facility] [address], [place],  [address]   Glucose-Capillary 10/09/2021 123 (H)  70 - 99 mg/dL Final   Glucose reference range applies only to samples taken after fasting for at least 8 hours.   There may be more visits with results that are not included.    Blood Alcohol level:  Lab Results  Component Value Date   ETH <10 11/23/2021   ETH <10 46/96/2952    Metabolic Disorder Labs: Lab Results  Component Value Date   HGBA1C 6.7 (H) 12/28/2021   MPG 145.59 12/28/2021   MPG 154.2 10/04/2021   Lab Results  Component Value Date   PROLACTIN 3.8 (L) 11/23/2021   Lab Results  Component Value Date   CHOL 171 11/23/2021   TRIG 125 11/23/2021   HDL 65 11/23/2021   CHOLHDL 2.6 11/23/2021   VLDL 25 11/23/2021   LDLCALC 81 11/23/2021   [place] 95 10/04/2021    Therapeutic Lab Levels: No results found for: "LITHIUM" Lab Results  Component Value Date   VALPROATE 17 (L) 02/17/2022   VALPROATE 33 (L) 01/18/2022   No results found for: "CBMZ"  Physical Findings   PHQ2-9    Flowsheet Row ED from 11/23/2021 in [facility]  PHQ-2 Total Score 2  PHQ-9 Total Score 3      Flowsheet Row ED from 02/18/2022 in [facility] from 02/17/2022 in [facility] from 02/09/2022 in [facility] No Risk No Risk No Risk       Psychiatric Specialty Exam  Presentation  General Appearance:  Appropriate for Environment  Eye Contact: Good  Speech: Clear and Coherent  Speech Volume: Normal  Handedness: Right   Mood and Affect  Mood: Euthymic  Affect: Blunt   Thought Process  Thought Processes: Coherent; Goal Directed; Linear  Descriptions of Associations:Intact  Orientation:Full (Time, Place and Person)  Thought Content:WDL  Diagnosis of Schizophrenia or Schizoaffective disorder in past: Yes  Duration of Psychotic Symptoms: No data recorded  Hallucinations:Hallucinations: None  Ideas of Reference:None  Suicidal Thoughts:Suicidal Thoughts: No  Homicidal Thoughts:Homicidal Thoughts: No   Sensorium   Memory: Immediate Fair  Judgment: Intact  Insight: Present   Executive Functions  Concentration: Fair  Attention Span: Fair  Recall: [employer] of Knowledge: Fair  Language: Fair   Psychomotor Activity  Psychomotor Activity: Psychomotor Activity: Normal   Assets  Assets: Communication Skills; Desire for Improvement; Leisure Time; Resilience; Social Support; Financial Resources/Insurance   Sleep  Sleep: Sleep: Good   No data recorded  Physical Exam  Physical Exam Vitals reviewed.  Constitutional:      General: She is not in acute distress.    Appearance: She is not toxic-appearing.  HENT:     Head: Normocephalic.  Eyes:     Extraocular Movements: Extraocular movements intact.     Pupils: Pupils are equal, round, and reactive to light.  Cardiovascular:     Rate and Rhythm: Tachycardia present.  Pulmonary:     Effort: Pulmonary effort is normal.     Breath sounds: Normal breath sounds.  Musculoskeletal:        General: Normal range of motion.     Cervical back: Normal range of motion.  Neurological:     General: No focal deficit present.     Mental Status: She is alert.    Review of Systems  Psychiatric/Behavioral: Negative.     Blood pressure 121/84, pulse (!) 112, temperature (!) 97.4 F (36.3 C), temperature source Oral, resp. rate  18, SpO2 97 %. There is no height or weight on file to calculate BMI.   Plan:  Coordination of Care with TOC /SW and DSS to secure placement for patient Patient has been psychiatrically cleared and does not meet criteria for inpatient psychiatric treatment.  Disposition is pending possible placement at [facility].    Molli Barrows, FNP-C, PMHNP-BC 02/27/2022 10:22 AM

## 2022-02-27 NOTE — ED Notes (Signed)
Assumed care of patient. [facility] reports she is feeling better and states '' my stomach is better today. '' Pt reports sleeping well and denies any acute concerns. Breakfast given of oatmeal and coffee . Pt is ambulatory , in no acute distress, able to make her needs known. Pt remains boarding for placement.

## 2022-02-27 NOTE — ED Notes (Signed)
Pt sleeping@this time. Breathing even and unlabored. Will continue to monitor for safety 

## 2022-02-28 DIAGNOSIS — F209 Schizophrenia, unspecified: Secondary | ICD-10-CM | POA: Diagnosis not present

## 2022-02-28 LAB — GLUCOSE, CAPILLARY
Glucose-Capillary: 109 mg/dL — ABNORMAL HIGH (ref 70–99)
Glucose-Capillary: 134 mg/dL — ABNORMAL HIGH (ref 70–99)
Glucose-Capillary: 160 mg/dL — ABNORMAL HIGH (ref 70–99)
Glucose-Capillary: 136 mg/dL — ABNORMAL HIGH (ref 70–99)
Glucose-Capillary: 173 mg/dL — ABNORMAL HIGH (ref 70–99)

## 2022-02-28 NOTE — ED Provider Notes (Signed)
Behavioral Health Progress Note  Date and Time: 02/28/2022 10:02 AM Name: Teresa Murillo MRN:  [id number]    Diagnosis:  Final diagnoses:  Schizophrenia, unspecified type ([place])    Total Time spent with patient: 15 minutes  Brief HPI: Vika Buske, 61 year old, female, with schizophrenia, aggressive behavior,  IDD (mild), admitted to  [facility] on 11/23/21 voluntarily, accompanied by [employer], and reported that she was locked out of her group home. It was later confirmed that Mykhia was dismissed from group home due to recurrent history of eloping from the group home. Patient was evaluated here subsequently discharged from [facility], however has remained at this facility as a boarder while DSS continues efforts to find permanent placement for [facility].   Brief summary of cognitive evaluations completed during stay here at [facility] per chart review:  (AID to Capacity Evaluation (ACE) completed by Dr. Louis Meckel, Dr. Dwyane Dee on 12/11/2021.  Please see media tab for full details.    [name], PhD completed: Wechsler Adult Intelligence Scale-4, Ms. Coltrin achieved a full-scale IQ score of 73 and a percentile rank of 4 placing her in the borderline range of intellectual functioning (12/14/2021) Please see consult note from [name], PhD on 12/14/2021.)   Patient continues to be psychiatrically cleared and has no apparent acute psychiatric crisis.  Patient does not meet inpatient psychiatric treatment criteria.  Current plan as of 02/28/2022: Disposition is pending placement, possible placement at [facility].  Subjective:   On reassessment evaluation [facility], she was initially seen sitting up in bed resting comfortably, awake, no acute distress.  During evaluation, patient was pleasant, cooperative, engaged.  Patient denied any somatic concerns or side effects of medication.  Reported her sleep and appetite are appropriate and stable.  Reported her mood is "bored", but pretty  good.  She denied SI/HI/AVH, paranoia, contracted to safety.  Patient reported being bored and requested a board game or card game.   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 On the Wechsler Adult Intelligence Scale-4, Ms. Cura achieved a full-scale IQ score of 73 and a percentile rank of 4 placing her in the borderline range of intellectual functioning.    Chronic obstructive pulmonary disease (COPD) (HCC)    Glaucoma    Hyperlipidemia    Hypertension    Iron deficiency    Schizoaffective disorder (HCC)    Type 2 diabetes mellitus (HCC)     Past Surgical History:  Procedure Laterality Date   TUBAL LIGATION     Family History:  Family History  Problem Relation Age of Onset   Breast cancer Maternal Grandmother     Social History:  Social History   Substance and Sexual Activity  Alcohol Use Not Currently     Social History   Substance and Sexual Activity  Drug Use Not Currently    Social History   Socioeconomic History   Marital status: Divorced    Spouse name: Not on file   Number of children: Not on file   Years of education: Not on file   Highest education level: Not on file  Occupational History   Not on file  Tobacco Use   Smoking status: Former    Packs/day: 1.00    Types: Cigars, Cigarettes    Quit date: 11/09/2021    Years since quitting: 0.3   Smokeless tobacco: Current  Vaping Use  Vaping Use: Never used  Substance and Sexual Activity   Alcohol use: Not Currently   Drug use: Not Currently   Sexual activity: Not Currently    Birth control/protection: Surgical  Other Topics Concern   Not on file  Social History Narrative   Not on file   Social Determinants of Health   Financial Resource Strain: Not on file  Food Insecurity: Not on file  Transportation Needs: Not on file  Physical Activity: Not on file   Stress: Not on file  Social Connections: Not on file   SDOH:  SDOH Screenings   Depression (PHQ2-9): Low Risk  (11/23/2021)  Tobacco Use: High Risk (02/17/2022)  Current Medications:  Current Facility-Administered Medications  Medication Dose Route Frequency Provider Last Rate Last Admin   acetaminophen (TYLENOL) tablet 650 mg  650 mg Oral Q6H PRN [name], NP   650 mg at 02/27/22 2132   albuterol (VENTOLIN HFA) 108 (90 Base) MCG/ACT inhaler 2 puff  2 puff Inhalation Q6H PRN [name], NP   2 puff at 02/20/22 1944   alum & mag hydroxide-simeth (MAALOX/MYLANTA) 200-200-20 MG/5ML suspension 30 mL  30 mL Oral Q4H PRN [name], NP   30 mL at 02/28/22 0934   apixaban (ELIQUIS) tablet 5 mg  5 mg Oral BID [name], NP   5 mg at 02/28/22 0935   ARIPiprazole (ABILIFY) tablet 10 mg  10 mg Oral Daily [name], NP   10 mg at 02/28/22 0935   cloZAPine (CLOZARIL) tablet 75 mg  75 mg Oral BID France Ravens, MD   75 mg at 02/28/22 0935   colchicine tablet 0.6 mg  0.6 mg Oral QHS [name], NP   0.6 mg at 02/27/22 2132   diltiazem (CARDIZEM CD) 24 hr capsule 240 mg  240 mg Oral Daily [name], NP   240 mg at 02/28/22 0935   divalproex (DEPAKOTE ER) 24 hr tablet 500 mg  500 mg Oral BID [name], NP   500 mg at 02/28/22 0935   haloperidol (HALDOL) tablet 10 mg  10 mg Oral TID PRN [name], NP       insulin aspart (novoLOG) injection 0-9 Units  0-9 Units Subcutaneous TID WC France Ravens, MD   2 Units at 02/27/22 1627   insulin glargine-yfgn (SEMGLEE) injection 12 Units  12 Units Subcutaneous Daily [name], NP   12 Units at 02/28/22 0934   latanoprost (XALATAN) 0.005 % ophthalmic solution 1 drop  1 drop Both Eyes QHS [name], NP   1 drop at 02/27/22 2138   magnesium hydroxide (MILK OF MAGNESIA) suspension 30 mL  30 mL Oral Daily PRN [name], NP       metFORMIN (GLUCOPHAGE) tablet 500 mg  500 mg Oral BID WC [name], NP   500 mg at 02/28/22 0804    mometasone-formoterol (DULERA) 200-5 MCG/ACT inhaler 2 puff  2 puff Inhalation BID [name], NP   2 puff at 02/28/22 0806   nicotine (NICODERM CQ - dosed in mg/24 hr) patch 7 mg  7 mg Transdermal Daily [name], NP   7 mg at 02/28/22 0935   valbenazine (INGREZZA) capsule 40 mg  40 mg Oral q AM [name], NP   40 mg at 02/28/22 3329   Vitamin D (Ergocalciferol) (DRISDOL) 1.25 MG (50000 UNIT) capsule 50,000 Units  50,000 Units Oral Q Leonie Green, NP   50,000 Units at 02/25/22 5188   Current Outpatient Medications  Medication Sig  Dispense Refill   Accu-Chek Softclix Lancets lancets Use as directed up to four times daily 100 each 0   apixaban (ELIQUIS) 5 MG TABS tablet Take 1 tablet (5 mg total) by mouth 2 (two) times daily. 60 tablet 0   ARIPiprazole (ABILIFY) 10 MG tablet Take 1 tablet (10 mg total) by mouth daily. 30 tablet 0   Blood Glucose Monitoring Suppl (ACCU-CHEK GUIDE) w/Device KIT Use as directed up to four times daily 1 kit 0   budesonide-formoterol (SYMBICORT) 160-4.5 MCG/ACT inhaler Inhale 2 puffs into the lungs in the morning and at bedtime.     Cholecalciferol (VITAMIN D3) 1.25 MG (50000 UT) CAPS Take 50,000 Units by mouth every Thursday.     clozapine (CLOZARIL) 50 MG tablet Take 1 tablet (50 mg total) by mouth daily. 30 tablet 0   colchicine 0.6 MG tablet Take 1 tablet (0.6 mg total) by mouth at bedtime. 30 tablet 0   diltiazem (CARDIZEM CD) 240 MG 24 hr capsule Take 1 capsule (240 mg total) by mouth daily. (Patient not taking: Reported on 11/24/2021) 30 capsule 0   divalproex (DEPAKOTE ER) 500 MG 24 hr tablet Take 1 tablet (500 mg total) by mouth 2 (two) times daily. 60 tablet 0   glucose blood test strip Use as directed up to four times daily 50 each 0   haloperidol (HALDOL) 10 MG tablet Take 1 tablet (10 mg total) by mouth 3 (three) times daily as needed for agitation (and psychotic symptoms).     INGREZZA 40 MG capsule Take 1 capsule (40 mg total) by mouth in  the morning. 30 capsule 0   insulin aspart (NOVOLOG) 100 UNIT/ML FlexPen Before each meal 3 times a day, 140-199 - 2 units, 200-250 - 4 units, 251-299 - 6 units,  300-349 - 8 units,  350 or above 10 units. Insulin PEN if approved, provide syringes and needles if needed.Please switch to any approved short acting Insulin if needed. 15 mL 0   insulin glargine (LANTUS) 100 UNIT/ML Solostar Pen Inject 12 Units into the skin daily. 15 mL 0   Insulin Pen Needle 32G X 4 MM MISC Use 4 times a day with insulin, 1 month supply. 100 each 0   latanoprost (XALATAN) 0.005 % ophthalmic solution Place 1 drop into both eyes at bedtime.     metFORMIN (GLUCOPHAGE) 500 MG tablet Take 500 mg by mouth 2 (two) times daily with a meal.     nicotine (NICODERM CQ - DOSED IN MG/24 HR) 7 mg/24hr patch Place 1 patch (7 mg total) onto the skin daily. 28 patch 0   PROAIR HFA 108 (90 Base) MCG/ACT inhaler Inhale 2 puffs into the lungs every 6 (six) hours as needed for wheezing or shortness of breath.      Labs  Lab Results:  Admission on 02/18/2022  Component Date Value Ref Range Status   Glucose-Capillary 02/18/2022 254 (H)  70 - 99 mg/dL Final   Glucose reference range applies only to samples taken after fasting for at least 8 hours.   Glucose-Capillary 02/18/2022 112 (H)  70 - 99 mg/dL Final   Glucose reference range applies only to samples taken after fasting for at least 8 hours.   Glucose-Capillary 02/19/2022 159 (H)  70 - 99 mg/dL Final   Glucose reference range applies only to samples taken after fasting for at least 8 hours.   Glucose-Capillary 02/19/2022 160 (H)  70 - 99 mg/dL Final   Glucose reference range applies only to  samples taken after fasting for at least 8 hours.   Glucose-Capillary 02/19/2022 177 (H)  70 - 99 mg/dL Final   Glucose reference range applies only to samples taken after fasting for at least 8 hours.   Glucose-Capillary 02/19/2022 147 (H)  70 - 99 mg/dL Final   Glucose reference range  applies only to samples taken after fasting for at least 8 hours.   Glucose-Capillary 02/20/2022 126 (H)  70 - 99 mg/dL Final   Glucose reference range applies only to samples taken after fasting for at least 8 hours.   Glucose-Capillary 02/20/2022 132 (H)  70 - 99 mg/dL Final   Glucose reference range applies only to samples taken after fasting for at least 8 hours.   Glucose-Capillary 02/20/2022 125 (H)  70 - 99 mg/dL Final   Glucose reference range applies only to samples taken after fasting for at least 8 hours.   Glucose-Capillary 02/21/2022 151 (H)  70 - 99 mg/dL Final   Glucose reference range applies only to samples taken after fasting for at least 8 hours.   Glucose-Capillary 02/21/2022 106 (H)  70 - 99 mg/dL Final   Glucose reference range applies only to samples taken after fasting for at least 8 hours.   Glucose-Capillary 02/21/2022 149 (H)  70 - 99 mg/dL Final   Glucose reference range applies only to samples taken after fasting for at least 8 hours.   Glucose-Capillary 02/22/2022 131 (H)  70 - 99 mg/dL Final   Glucose reference range applies only to samples taken after fasting for at least 8 hours.   Glucose-Capillary 02/22/2022 141 (H)  70 - 99 mg/dL Final   Glucose reference range applies only to samples taken after fasting for at least 8 hours.   Glucose-Capillary 02/22/2022 183 (H)  70 - 99 mg/dL Final   Glucose reference range applies only to samples taken after fasting for at least 8 hours.   Glucose-Capillary 02/23/2022 133 (H)  70 - 99 mg/dL Final   Glucose reference range applies only to samples taken after fasting for at least 8 hours.   WBC 02/23/2022 4.8  4.0 - 10.5 K/uL Final   RBC 02/23/2022 3.96  3.87 - 5.11 MIL/uL Final   Hemoglobin 02/23/2022 9.7 (L)  12.0 - 15.0 g/dL Final   HCT 02/23/2022 30.6 (L)  36.0 - 46.0 % Final   MCV 02/23/2022 77.3 (L)  80.0 - 100.0 fL Final   MCH 02/23/2022 24.5 (L)  26.0 - 34.0 pg Final   MCHC 02/23/2022 31.7  30.0 - 36.0 g/dL  Final   RDW 02/23/2022 16.5 (H)  11.5 - 15.5 % Final   Platelets 02/23/2022 372  150 - 400 K/uL Final   nRBC 02/23/2022 0.0  0.0 - 0.2 % Final   Neutrophils Relative % 02/23/2022 56  % Final   Neutro Abs 02/23/2022 2.7  1.7 - 7.7 K/uL Final   Lymphocytes Relative 02/23/2022 32  % Final   Lymphs Abs 02/23/2022 1.5  0.7 - 4.0 K/uL Final   Monocytes Relative 02/23/2022 9  % Final   Monocytes Absolute 02/23/2022 0.4  0.1 - 1.0 K/uL Final   Eosinophils Relative 02/23/2022 2  % Final   Eosinophils Absolute 02/23/2022 0.1  0.0 - 0.5 K/uL Final   Basophils Relative 02/23/2022 1  % Final   Basophils Absolute 02/23/2022 0.1  0.0 - 0.1 K/uL Final   Immature Granulocytes 02/23/2022 0  % Final   Abs Immature Granulocytes 02/23/2022 0.01  0.00 - 0.07  K/uL Final   Performed at [facility], [place] [address], [place], [place] [address]   Glucose-Capillary 02/23/2022 202 (H)  70 - 99 mg/dL Final   Glucose reference range applies only to samples taken after fasting for at least 8 hours.   Glucose-Capillary 02/23/2022 143 (H)  70 - 99 mg/dL Final   Glucose reference range applies only to samples taken after fasting for at least 8 hours.   Glucose-Capillary 02/23/2022 143 (H)  70 - 99 mg/dL Final   Glucose reference range applies only to samples taken after fasting for at least 8 hours.   Glucose-Capillary 02/24/2022 207 (H)  70 - 99 mg/dL Final   Glucose reference range applies only to samples taken after fasting for at least 8 hours.   Glucose-Capillary 02/24/2022 109 (H)  70 - 99 mg/dL Final   Glucose reference range applies only to samples taken after fasting for at least 8 hours.   Glucose-Capillary 02/24/2022 127 (H)  70 - 99 mg/dL Final   Glucose reference range applies only to samples taken after fasting for at least 8 hours.   Glucose-Capillary 02/24/2022 130 (H)  70 - 99 mg/dL Final   Glucose reference range applies only to samples taken after fasting for at least 8 hours.    Glucose-Capillary 02/25/2022 117 (H)  70 - 99 mg/dL Final   Glucose reference range applies only to samples taken after fasting for at least 8 hours.   Glucose-Capillary 02/25/2022 104 (H)  70 - 99 mg/dL Final   Glucose reference range applies only to samples taken after fasting for at least 8 hours.   Glucose-Capillary 02/25/2022 180 (H)  70 - 99 mg/dL Final   Glucose reference range applies only to samples taken after fasting for at least 8 hours.   Glucose-Capillary 02/25/2022 180 (H)  70 - 99 mg/dL Final   Glucose reference range applies only to samples taken after fasting for at least 8 hours.   Glucose-Capillary 02/26/2022 133 (H)  70 - 99 mg/dL Final   Glucose reference range applies only to samples taken after fasting for at least 8 hours.   Glucose-Capillary 02/26/2022 85  70 - 99 mg/dL Final   Glucose reference range applies only to samples taken after fasting for at least 8 hours.   Glucose-Capillary 02/26/2022 136 (H)  70 - 99 mg/dL Final   Glucose reference range applies only to samples taken after fasting for at least 8 hours.   Glucose-Capillary 02/26/2022 170 (H)  70 - 99 mg/dL Final   Glucose reference range applies only to samples taken after fasting for at least 8 hours.   Glucose-Capillary 02/26/2022 185 (H)  70 - 99 mg/dL Final   Glucose reference range applies only to samples taken after fasting for at least 8 hours.   Glucose-Capillary 02/27/2022 124 (H)  70 - 99 mg/dL Final   Glucose reference range applies only to samples taken after fasting for at least 8 hours.   Glucose-Capillary 02/27/2022 161 (H)  70 - 99 mg/dL Final   Glucose reference range applies only to samples taken after fasting for at least 8 hours.   Glucose-Capillary 02/27/2022 170 (H)  70 - 99 mg/dL Final   Glucose reference range applies only to samples taken after fasting for at least 8 hours.   Glucose-Capillary 02/27/2022 154 (H)  70 - 99 mg/dL Final   Glucose reference range applies only to  samples taken after fasting for at least 8 hours.  Glucose-Capillary 02/28/2022 109 (H)  70 - 99 mg/dL Final   Glucose reference range applies only to samples taken after fasting for at least 8 hours.  Admission on 02/17/2022, Discharged on 02/18/2022  Component Date Value Ref Range Status   Sodium 02/17/2022 136  135 - 145 mmol/L Final   Potassium 02/17/2022 3.9  3.5 - 5.1 mmol/L Final   Chloride 02/17/2022 101  98 - 111 mmol/L Final   CO2 02/17/2022 25  22 - 32 mmol/L Final   Glucose, Bld 02/17/2022 113 (H)  70 - 99 mg/dL Final   Glucose reference range applies only to samples taken after fasting for at least 8 hours.   BUN 02/17/2022 9  6 - 20 mg/dL Final   Creatinine, Ser 02/17/2022 0.50  0.44 - 1.00 mg/dL Final   Calcium 02/17/2022 8.9  8.9 - 10.3 mg/dL Final   GFR, Estimated 02/17/2022 >60  >60 mL/min Final   Comment: (NOTE) Calculated using the CKD-EPI Creatinine Equation (2021)    Anion gap 02/17/2022 10  5 - 15 Final   Performed at [facility], [place] [address] [place], [place] [address]   WBC 02/17/2022 6.5  4.0 - 10.5 K/uL Final   RBC 02/17/2022 4.24  3.87 - 5.11 MIL/uL Final   Hemoglobin 02/17/2022 10.2 (L)  12.0 - 15.0 g/dL Final   HCT 02/17/2022 33.2 (L)  36.0 - 46.0 % Final   MCV 02/17/2022 78.3 (L)  80.0 - 100.0 fL Final   MCH 02/17/2022 24.1 (L)  26.0 - 34.0 pg Final   MCHC 02/17/2022 30.7  30.0 - 36.0 g/dL Final   RDW 02/17/2022 17.2 (H)  11.5 - 15.5 % Final   Platelets 02/17/2022 390  150 - 400 K/uL Final   nRBC 02/17/2022 0.0  0.0 - 0.2 % Final   Performed at [facility], [place] [address] [place], [place] [address]   Troponin I (High Sensitivity) 02/17/2022 4  <18 ng/L Final   Comment: (NOTE) Elevated high sensitivity troponin I (hsTnI) values and significant  changes across serial measurements may suggest ACS but many other  chronic and acute conditions are known to elevate hsTnI results.  Refer to the "Links" section for chest pain algorithms and  additional  guidance. Performed at [facility], [place] [address], [place], [place] [address]    Valproic Acid Lvl 02/17/2022 17 (L)  50.0 - 100.0 ug/mL Final   Performed at [place] [address] [place], [place] [address]   B Natriuretic Peptide 02/17/2022 30.9  0.0 - 100.0 pg/mL Final   Performed at [place] [address], [place], [place] [address]   Troponin I (High Sensitivity) 02/18/2022 4  <18 ng/L Final   Comment: (NOTE) Elevated high sensitivity troponin I (hsTnI) values and significant  changes across serial measurements may suggest ACS but many other  chronic and acute conditions are known to elevate hsTnI results.  Refer to the "Links" section for chest pain algorithms and additional  guidance. Performed at [facility], [place] [address], [place],  [address]    D-Dimer, Quant 02/18/2022 0.29  0.00 - 0.50 ug/mL-FEU Final   Comment: (NOTE) At the manufacturer cut-off value of 0.5 g/mL FEU, this assay has a negative predictive value of 95-100%.This assay is intended for use in conjunction with a clinical pretest probability (PTP) assessment model to exclude pulmonary embolism (PE) and deep venous thrombosis (DVT) in outpatients suspected of PE or  DVT. Results should be correlated with clinical presentation. Performed at [facility], [place] [address], [place], [place] [address]   Admission on 02/09/2022, Discharged on 02/17/2022  Component Date Value Ref Range Status   Glucose-Capillary 02/09/2022 118 (H)  70 - 99 mg/dL Final   Glucose reference range applies only to samples taken after fasting for at least 8 hours.   Glucose-Capillary 02/10/2022 148 (H)  70 - 99 mg/dL Final   Glucose reference range applies only to samples taken after fasting for at least 8 hours.   Glucose-Capillary 02/10/2022 174 (H)  70 - 99 mg/dL Final   Glucose reference range applies only to samples taken after fasting for at least 8 hours.    Glucose-Capillary 02/10/2022 128 (H)  70 - 99 mg/dL Final   Glucose reference range applies only to samples taken after fasting for at least 8 hours.   Glucose-Capillary 02/10/2022 182 (H)  70 - 99 mg/dL Final   Glucose reference range applies only to samples taken after fasting for at least 8 hours.   Glucose-Capillary 02/11/2022 158 (H)  70 - 99 mg/dL Final   Glucose reference range applies only to samples taken after fasting for at least 8 hours.   Glucose-Capillary 02/11/2022 159 (H)  70 - 99 mg/dL Final   Glucose reference range applies only to samples taken after fasting for at least 8 hours.   Glucose-Capillary 02/11/2022 153 (H)  70 - 99 mg/dL Final   Glucose reference range applies only to samples taken after fasting for at least 8 hours.   Glucose-Capillary 02/11/2022 159 (H)  70 - 99 mg/dL Final   Glucose reference range applies only to samples taken after fasting for at least 8 hours.   Glucose-Capillary 02/11/2022 153 (H)  70 - 99 mg/dL Final   Glucose reference range applies only to samples taken after fasting for at least 8 hours.   Glucose-Capillary 02/11/2022 109 (H)  70 - 99 mg/dL Final   Glucose reference range applies only to samples taken after fasting for at least 8 hours.   Glucose-Capillary 02/11/2022 213 (H)  70 - 99 mg/dL Final   Glucose reference range applies only to samples taken after fasting for at least 8 hours.   Glucose-Capillary 02/11/2022 229 (H)  70 - 99 mg/dL Final   Glucose reference range applies only to samples taken after fasting for at least 8 hours.   Glucose-Capillary 02/12/2022 128 (H)  70 - 99 mg/dL Final   Glucose reference range applies only to samples taken after fasting for at least 8 hours.   Glucose-Capillary 02/12/2022 149 (H)  70 - 99 mg/dL Final   Glucose reference range applies only to samples taken after fasting for at least 8 hours.   Color, Urine 02/12/2022 YELLOW  YELLOW Final   APPearance 02/12/2022 CLEAR  CLEAR Final   Specific  Gravity, Urine 02/12/2022 1.015  1.005 - 1.030 Final   pH 02/12/2022 5.0  5.0 - 8.0 Final   Glucose, UA 02/12/2022 NEGATIVE  NEGATIVE mg/dL Final   Hgb urine dipstick 02/12/2022 NEGATIVE  NEGATIVE Final   Bilirubin Urine 02/12/2022 NEGATIVE  NEGATIVE Final   Ketones, ur 02/12/2022 NEGATIVE  NEGATIVE mg/dL Final   Protein, ur 02/12/2022 NEGATIVE  NEGATIVE mg/dL Final   Nitrite 02/12/2022 NEGATIVE  NEGATIVE Final   Leukocytes,Ua 02/12/2022 NEGATIVE  NEGATIVE Final   Performed at [facility], [place] [address] [place], [place] [address]   Specimen Description 02/12/2022 URINE, [place]  Final   Special Requests 02/12/2022    Final                   Value:NONE Performed at [facility], [place] [address] [place], [place] [address]    Culture 02/12/2022 10,000 COLONIES/mL PROTEUS MIRABILIS (A)   Final   Report Status 02/12/2022 02/15/2022 FINAL   Final   Organism ID, Bacteria 02/12/2022 PROTEUS MIRABILIS (A)   Final   Glucose-Capillary 02/12/2022 128 (H)  70 - 99 mg/dL Final   Glucose reference range applies only to samples taken after fasting for at least 8 hours.   Glucose-Capillary 02/12/2022 196 (H)  70 - 99 mg/dL Final   Glucose reference range applies only to samples taken after fasting for at least 8 hours.   Glucose-Capillary 02/13/2022 168 (H)  70 - 99 mg/dL Final   Glucose reference range applies only to samples taken after fasting for at least 8 hours.   Glucose-Capillary 02/13/2022 153 (H)  70 - 99 mg/dL Final   Glucose reference range applies only to samples taken after fasting for at least 8 hours.   Glucose-Capillary 02/13/2022 134 (H)  70 - 99 mg/dL Final   Glucose reference range applies only to samples taken after fasting for at least 8 hours.   Glucose-Capillary 02/13/2022 178 (H)  70 - 99 mg/dL Final   Glucose reference range applies only to samples taken after fasting for at least 8 hours.   Glucose-Capillary 02/14/2022 156 (H)  70 - 99 mg/dL Final    Glucose reference range applies only to samples taken after fasting for at least 8 hours.   Glucose-Capillary 02/14/2022 169 (H)  70 - 99 mg/dL Final   Glucose reference range applies only to samples taken after fasting for at least 8 hours.   Glucose-Capillary 02/14/2022 156 (H)  70 - 99 mg/dL Final   Glucose reference range applies only to samples taken after fasting for at least 8 hours.   Glucose-Capillary 02/14/2022 148 (H)  70 - 99 mg/dL Final   Glucose reference range applies only to samples taken after fasting for at least 8 hours.   Glucose-Capillary 02/15/2022 159 (H)  70 - 99 mg/dL Final   Glucose reference range applies only to samples taken after fasting for at least 8 hours.   Glucose-Capillary 02/15/2022 125 (H)  70 - 99 mg/dL Final   Glucose reference range applies only to samples taken after fasting for at least 8 hours.   Glucose-Capillary 02/15/2022 142 (H)  70 - 99 mg/dL Final   Glucose reference range applies only to samples taken after fasting for at least 8 hours.   Glucose-Capillary 02/15/2022 128 (H)  70 - 99 mg/dL Final   Glucose reference range applies only to samples taken after fasting for at least 8 hours.   Glucose-Capillary 02/16/2022 137 (H)  70 - 99 mg/dL Final   Glucose reference range applies only to samples taken after fasting for at least 8 hours.   WBC 02/16/2022 6.0  4.0 - 10.5 K/uL Final   RBC 02/16/2022 4.48  3.87 - 5.11 MIL/uL Final   Hemoglobin 02/16/2022 10.7 (L)  12.0 - 15.0 g/dL Final   HCT 02/16/2022 35.4 (L)  36.0 - 46.0 % Final   MCV 02/16/2022 79.0 (L)  80.0 - 100.0 fL Final   MCH 02/16/2022 23.9 (L)  26.0 - 34.0 pg Final   MCHC 02/16/2022 30.2  30.0 - 36.0 g/dL Final   RDW 02/16/2022 17.0 (H)  11.5 - 15.5 % Final   Platelets 02/16/2022 387  150 - 400 K/uL Final   nRBC 02/16/2022 0.0  0.0 - 0.2 % Final   Neutrophils Relative % 02/16/2022 54  % Final   Neutro Abs 02/16/2022 3.2  1.7 - 7.7 K/uL Final   Lymphocytes Relative 02/16/2022 35  %  Final   Lymphs Abs 02/16/2022 2.1  0.7 - 4.0 K/uL Final   Monocytes Relative 02/16/2022 6  % Final   Monocytes Absolute 02/16/2022 0.4  0.1 - 1.0 K/uL Final   Eosinophils Relative 02/16/2022 2  % Final   Eosinophils Absolute 02/16/2022 0.1  0.0 - 0.5 K/uL Final   Basophils Relative 02/16/2022 2  % Final   Basophils Absolute 02/16/2022 0.1  0.0 - 0.1 K/uL Final   Immature Granulocytes 02/16/2022 1  % Final   Abs Immature Granulocytes 02/16/2022 0.04  0.00 - 0.07 K/uL Final   Performed at [facility], [place] [address], [place], [place] [address]   Glucose-Capillary 02/16/2022 131 (H)  70 - 99 mg/dL Final   Glucose reference range applies only to samples taken after fasting for at least 8 hours.   Glucose-Capillary 02/16/2022 123 (H)  70 - 99 mg/dL Final   Glucose reference range applies only to samples taken after fasting for at least 8 hours.   Glucose-Capillary 02/16/2022 195 (H)  70 - 99 mg/dL Final   Glucose reference range applies only to samples taken after fasting for at least 8 hours.   Glucose-Capillary 02/17/2022 148 (H)  70 - 99 mg/dL Final   Glucose reference range applies only to samples taken after fasting for at least 8 hours.  Admission on 02/08/2022, Discharged on 02/09/2022  Component Date Value Ref Range Status   B Natriuretic Peptide 02/08/2022 40.2  0.0 - 100.0 pg/mL Final   Performed at [facility], [address], [place], [place] [address]   Troponin I (High Sensitivity) 02/08/2022 4  <18 ng/L Final   Comment: (NOTE) Elevated high sensitivity troponin I (hsTnI) values and significant  changes across serial measurements may suggest ACS but many other  chronic and acute conditions are known to elevate hsTnI results.  Refer to the "Links" section for chest pain algorithms and additional  guidance. Performed at [facility], [place] [address] [place], [place] [address]    WBC 02/08/2022 8.5  4.0 - 10.5 K/uL Final   RBC 02/08/2022 4.30  3.87 - 5.11  MIL/uL Final   Hemoglobin 02/08/2022 10.7 (L)  12.0 - 15.0 g/dL Final   HCT 02/08/2022 33.1 (L)  36.0 - 46.0 % Final   MCV 02/08/2022 77.0 (L)  80.0 - 100.0 fL Final   MCH 02/08/2022 24.9 (L)  26.0 - 34.0 pg Final   MCHC 02/08/2022 32.3  30.0 - 36.0 g/dL Final   RDW 02/08/2022 16.5 (H)  11.5 - 15.5 % Final   Platelets 02/08/2022 361  150 - 400 K/uL Final   nRBC 02/08/2022 0.0  0.0 - 0.2 % Final   Performed at [facility] [address] [place], [place] [address]   Sodium 02/08/2022 131 (L)  135 - 145 mmol/L Final   Potassium 02/08/2022 4.4  3.5 - 5.1 mmol/L Final   Chloride 02/08/2022 96 (L)  98 - 111 mmol/L Final   CO2 02/08/2022 25  22 - 32 mmol/L Final   Glucose, Bld 02/08/2022 126 (H)  70 - 99 mg/dL Final   Glucose reference range applies  only to samples taken after fasting for at least 8 hours.   BUN 02/08/2022 11  6 - 20 mg/dL Final   Creatinine, Ser 02/08/2022 0.52  0.44 - 1.00 mg/dL Final   Calcium 02/08/2022 9.4  8.9 - 10.3 mg/dL Final   Total Protein 02/08/2022 7.5  6.5 - 8.1 g/dL Final   Albumin 02/08/2022 3.6  3.5 - 5.0 g/dL Final   AST 02/08/2022 22  15 - 41 U/L Final   ALT 02/08/2022 20  0 - 44 U/L Final   Alkaline Phosphatase 02/08/2022 67  38 - 126 U/L Final   Total Bilirubin 02/08/2022 0.1 (L)  0.3 - 1.2 mg/dL Final   GFR, Estimated 02/08/2022 >60  >60 mL/min Final   Comment: (NOTE) Calculated using the CKD-EPI Creatinine Equation (2021)    Anion gap 02/08/2022 10  5 - 15 Final   Performed at [facility] [address], [place], [place] [address]   Troponin I (High Sensitivity) 02/08/2022 5  <18 ng/L Final   Comment: (NOTE) Elevated high sensitivity troponin I (hsTnI) values and significant  changes across serial measurements may suggest ACS but many other  chronic and acute conditions are known to elevate hsTnI results.  Refer to the "Links" section for chest pain algorithms and additional  guidance. Performed at [facility], [place] [address], [place], [place] [address]    WBC 02/09/2022 7.3  4.0 - 10.5 K/uL Final   RBC 02/09/2022 4.70  3.87 - 5.11 MIL/uL Final   Hemoglobin 02/09/2022 11.4 (L)  12.0 - 15.0 g/dL Final   HCT 02/09/2022 36.6  36.0 - 46.0 % Final   MCV 02/09/2022 77.9 (L)  80.0 - 100.0 fL Final   MCH 02/09/2022 24.3 (L)  26.0 - 34.0 pg Final   MCHC 02/09/2022 31.1  30.0 - 36.0 g/dL Final   RDW 02/09/2022 16.6 (H)  11.5 - 15.5 % Final   Platelets 02/09/2022 403 (H)  150 - 400 K/uL Final   nRBC 02/09/2022 0.0  0.0 - 0.2 % Final   Performed at [facility] [address] [place], [place] [address]   Glucose-Capillary 02/08/2022 117 (H)  70 - 99 mg/dL Final   Glucose reference range applies only to samples taken after fasting for at least 8 hours.   Sodium 02/09/2022 138  135 - 145 mmol/L Final   Potassium 02/09/2022 3.7  3.5 - 5.1 mmol/L Final   Chloride 02/09/2022 103  98 - 111 mmol/L Final   CO2 02/09/2022 24  22 - 32 mmol/L Final   Glucose, Bld 02/09/2022 134 (H)  70 - 99 mg/dL Final   Glucose reference range applies only to samples taken after fasting for at least 8 hours.   BUN 02/09/2022 9  6 - 20 mg/dL Final   Creatinine, Ser 02/09/2022 0.44  0.44 - 1.00 mg/dL Final   Calcium 02/09/2022 8.3 (L)  8.9 - 10.3 mg/dL Final   GFR, Estimated 02/09/2022 >60  >60 mL/min Final   Comment: (NOTE) Calculated using the CKD-EPI Creatinine Equation (2021)    Anion gap 02/09/2022 11  5 - 15 Final   Performed at [facility], [place] [address] [place], [place] [address]   Iron 02/09/2022 20 (L)  28 - 170 ug/dL Final   TIBC 02/09/2022 455 (H)  250 - 450 ug/dL Final   Saturation Ratios 02/09/2022 4 (L)  10.4 - 31.8 % Final   UIBC 02/09/2022 435  ug/dL Final  Performed at [facility], [place] [address], [place], [place] [address]   Ferritin 02/09/2022 2 (L)  11 - 307 ng/mL Final   Performed at [facility] [address], [place], [place] [address]   Weight 02/09/2022 2,846.58  oz Final    Height 02/09/2022 62  in Final   BP 02/09/2022 143/80  mmHg Final   WBC 02/09/2022 8.1  4.0 - 10.5 K/uL Final   RBC 02/09/2022 4.38  3.87 - 5.11 MIL/uL Final   Hemoglobin 02/09/2022 10.9 (L)  12.0 - 15.0 g/dL Final   HCT 02/09/2022 33.3 (L)  36.0 - 46.0 % Final   MCV 02/09/2022 76.0 (L)  80.0 - 100.0 fL Final   MCH 02/09/2022 24.9 (L)  26.0 - 34.0 pg Final   MCHC 02/09/2022 32.7  30.0 - 36.0 g/dL Final   RDW 02/09/2022 16.5 (H)  11.5 - 15.5 % Final   Platelets 02/09/2022 397  150 - 400 K/uL Final   nRBC 02/09/2022 0.0  0.0 - 0.2 % Final   Neutrophils Relative % 02/09/2022 66  % Final   Neutro Abs 02/09/2022 5.4  1.7 - 7.7 K/uL Final   Lymphocytes Relative 02/09/2022 24  % Final   Lymphs Abs 02/09/2022 1.9  0.7 - 4.0 K/uL Final   Monocytes Relative 02/09/2022 8  % Final   Monocytes Absolute 02/09/2022 0.6  0.1 - 1.0 K/uL Final   Eosinophils Relative 02/09/2022 1  % Final   Eosinophils Absolute 02/09/2022 0.1  0.0 - 0.5 K/uL Final   Basophils Relative 02/09/2022 1  % Final   Basophils Absolute 02/09/2022 0.1  0.0 - 0.1 K/uL Final   Immature Granulocytes 02/09/2022 0  % Final   Abs Immature Granulocytes 02/09/2022 0.03  0.00 - 0.07 K/uL Final   Performed at [facility], [place] [address] [place], [place] [address]   Glucose-Capillary 02/09/2022 238 (H)  70 - 99 mg/dL Final   Glucose reference range applies only to samples taken after fasting for at least 8 hours.   Glucose-Capillary 02/09/2022 91  70 - 99 mg/dL Final   Glucose reference range applies only to samples taken after fasting for at least 8 hours.   CRP 02/09/2022 <0.5  <1.0 mg/dL Final   Performed at [facility] [address] [place], [place] [address]   Sed Rate 02/09/2022 15  0 - 22 mm/hr Final   Performed at [facility] [address] [place],  [address]   Glucose-Capillary 02/09/2022 137 (H)  70 - 99 mg/dL Final   Glucose reference range applies only to samples taken after fasting for at  least 8 hours.  Admission on 01/24/2022, Discharged on 02/08/2022  Component Date Value Ref Range Status   Glucose-Capillary 01/24/2022 143 (H)  70 - 99 mg/dL Final   Glucose reference range applies only to samples taken after fasting for at least 8 hours.   Glucose-Capillary 01/24/2022 120 (H)  70 - 99 mg/dL Final   Glucose reference range applies only to samples taken after fasting for at least 8 hours.   Glucose-Capillary 01/25/2022 122 (H)  70 - 99 mg/dL Final   Glucose reference range applies only to samples taken after fasting for at least 8 hours.   Glucose-Capillary 01/25/2022 190 (H)  70 - 99 mg/dL Final   Glucose reference range applies only to samples taken after fasting for at least 8 hours.   Glucose-Capillary 01/25/2022 163 (H)  70 -  99 mg/dL Final   Glucose reference range applies only to samples taken after fasting for at least 8 hours.   WBC 01/26/2022 6.5  4.0 - 10.5 K/uL Final   RBC 01/26/2022 4.53  3.87 - 5.11 MIL/uL Final   Hemoglobin 01/26/2022 11.3 (L)  12.0 - 15.0 g/dL Final   HCT 01/26/2022 36.4  36.0 - 46.0 % Final   MCV 01/26/2022 80.4  80.0 - 100.0 fL Final   MCH 01/26/2022 24.9 (L)  26.0 - 34.0 pg Final   MCHC 01/26/2022 31.0  30.0 - 36.0 g/dL Final   RDW 01/26/2022 17.3 (H)  11.5 - 15.5 % Final   Platelets 01/26/2022 327  150 - 400 K/uL Final   nRBC 01/26/2022 0.0  0.0 - 0.2 % Final   Neutrophils Relative % 01/26/2022 60  % Final   Neutro Abs 01/26/2022 3.9  1.7 - 7.7 K/uL Final   Lymphocytes Relative 01/26/2022 31  % Final   Lymphs Abs 01/26/2022 2.0  0.7 - 4.0 K/uL Final   Monocytes Relative 01/26/2022 7  % Final   Monocytes Absolute 01/26/2022 0.5  0.1 - 1.0 K/uL Final   Eosinophils Relative 01/26/2022 1  % Final   Eosinophils Absolute 01/26/2022 0.1  0.0 - 0.5 K/uL Final   Basophils Relative 01/26/2022 1  % Final   Basophils Absolute 01/26/2022 0.1  0.0 - 0.1 K/uL Final   Immature Granulocytes 01/26/2022 0  % Final   Abs Immature Granulocytes  01/26/2022 0.02  0.00 - 0.07 K/uL Final   Performed at [facility], [place] [address] [place], [place] [address]   Glucose-Capillary 01/26/2022 149 (H)  70 - 99 mg/dL Final   Glucose reference range applies only to samples taken after fasting for at least 8 hours.   Glucose-Capillary 01/26/2022 130 (H)  70 - 99 mg/dL Final   Glucose reference range applies only to samples taken after fasting for at least 8 hours.   Glucose-Capillary 01/26/2022 151 (H)  70 - 99 mg/dL Final   Glucose reference range applies only to samples taken after fasting for at least 8 hours.   Glucose-Capillary 01/26/2022 170 (H)  70 - 99 mg/dL Final   Glucose reference range applies only to samples taken after fasting for at least 8 hours.   Glucose-Capillary 01/27/2022 129 (H)  70 - 99 mg/dL Final   Glucose reference range applies only to samples taken after fasting for at least 8 hours.   Glucose-Capillary 01/27/2022 101 (H)  70 - 99 mg/dL Final   Glucose reference range applies only to samples taken after fasting for at least 8 hours.   Glucose-Capillary 01/27/2022 220 (H)  70 - 99 mg/dL Final   Glucose reference range applies only to samples taken after fasting for at least 8 hours.   Glucose-Capillary 01/27/2022 154 (H)  70 - 99 mg/dL Final   Glucose reference range applies only to samples taken after fasting for at least 8 hours.   Glucose-Capillary 01/27/2022 163 (H)  70 - 99 mg/dL Final   Glucose reference range applies only to samples taken after fasting for at least 8 hours.   Glucose-Capillary 01/28/2022 127 (H)  70 - 99 mg/dL Final   Glucose reference range applies only to samples taken after fasting for at least 8 hours.   Glucose-Capillary 01/28/2022 110 (H)  70 - 99 mg/dL Final   Glucose reference range applies only to samples taken after fasting for at least 8 hours.   Glucose-Capillary 01/28/2022  167 (H)  70 - 99 mg/dL Final   Glucose reference range applies only to samples taken after fasting for  at least 8 hours.   Glucose-Capillary 01/29/2022 145 (H)  70 - 99 mg/dL Final   Glucose reference range applies only to samples taken after fasting for at least 8 hours.   Glucose-Capillary 01/29/2022 203 (H)  70 - 99 mg/dL Final   Glucose reference range applies only to samples taken after fasting for at least 8 hours.   Glucose-Capillary 01/29/2022 153 (H)  70 - 99 mg/dL Final   Glucose reference range applies only to samples taken after fasting for at least 8 hours.   Glucose-Capillary 01/29/2022 166 (H)  70 - 99 mg/dL Final   Glucose reference range applies only to samples taken after fasting for at least 8 hours.   Glucose-Capillary 01/30/2022 142 (H)  70 - 99 mg/dL Final   Glucose reference range applies only to samples taken after fasting for at least 8 hours.   Glucose-Capillary 01/30/2022 138 (H)  70 - 99 mg/dL Final   Glucose reference range applies only to samples taken after fasting for at least 8 hours.   Glucose-Capillary 01/30/2022 185 (H)  70 - 99 mg/dL Final   Glucose reference range applies only to samples taken after fasting for at least 8 hours.   Glucose-Capillary 01/30/2022 220 (H)  70 - 99 mg/dL Final   Glucose reference range applies only to samples taken after fasting for at least 8 hours.   Glucose-Capillary 01/31/2022 147 (H)  70 - 99 mg/dL Final   Glucose reference range applies only to samples taken after fasting for at least 8 hours.   Glucose-Capillary 01/31/2022 131 (H)  70 - 99 mg/dL Final   Glucose reference range applies only to samples taken after fasting for at least 8 hours.   Glucose-Capillary 01/31/2022 169 (H)  70 - 99 mg/dL Final   Glucose reference range applies only to samples taken after fasting for at least 8 hours.   Glucose-Capillary 01/31/2022 144 (H)  70 - 99 mg/dL Final   Glucose reference range applies only to samples taken after fasting for at least 8 hours.   Comment 1 01/31/2022 RN AWARE   Final   Glucose-Capillary 02/01/2022 117 (H)   70 - 99 mg/dL Final   Glucose reference range applies only to samples taken after fasting for at least 8 hours.   Glucose-Capillary 02/01/2022 180 (H)  70 - 99 mg/dL Final   Glucose reference range applies only to samples taken after fasting for at least 8 hours.   Glucose-Capillary 02/01/2022 209 (H)  70 - 99 mg/dL Final   Glucose reference range applies only to samples taken after fasting for at least 8 hours.   Glucose-Capillary 02/02/2022 131 (H)  70 - 99 mg/dL Final   Glucose reference range applies only to samples taken after fasting for at least 8 hours.   WBC 02/02/2022 7.5  4.0 - 10.5 K/uL Final   RBC 02/02/2022 4.52  3.87 - 5.11 MIL/uL Final   Hemoglobin 02/02/2022 11.3 (L)  12.0 - 15.0 g/dL Final   HCT 02/02/2022 35.0 (L)  36.0 - 46.0 % Final   MCV 02/02/2022 77.4 (L)  80.0 - 100.0 fL Final   MCH 02/02/2022 25.0 (L)  26.0 - 34.0 pg Final   MCHC 02/02/2022 32.3  30.0 - 36.0 g/dL Final   RDW 02/02/2022 16.7 (H)  11.5 - 15.5 % Final   Platelets 02/02/2022 345  150 - 400 K/uL Final   nRBC 02/02/2022 0.0  0.0 - 0.2 % Final   Neutrophils Relative % 02/02/2022 63  % Final   Neutro Abs 02/02/2022 4.7  1.7 - 7.7 K/uL Final   Lymphocytes Relative 02/02/2022 28  % Final   Lymphs Abs 02/02/2022 2.1  0.7 - 4.0 K/uL Final   Monocytes Relative 02/02/2022 7  % Final   Monocytes Absolute 02/02/2022 0.5  0.1 - 1.0 K/uL Final   Eosinophils Relative 02/02/2022 1  % Final   Eosinophils Absolute 02/02/2022 0.1  0.0 - 0.5 K/uL Final   Basophils Relative 02/02/2022 1  % Final   Basophils Absolute 02/02/2022 0.1  0.0 - 0.1 K/uL Final   Immature Granulocytes 02/02/2022 0  % Final   Abs Immature Granulocytes 02/02/2022 0.03  0.00 - 0.07 K/uL Final   Performed at [facility], [place] [address] [place], [place] [address]   Glucose-Capillary 02/02/2022 95  70 - 99 mg/dL Final   Glucose reference range applies only to samples taken after fasting for at least 8 hours.   Glucose-Capillary 02/02/2022  120 (H)  70 - 99 mg/dL Final   Glucose reference range applies only to samples taken after fasting for at least 8 hours.   Glucose-Capillary 02/02/2022 191 (H)  70 - 99 mg/dL Final   Glucose reference range applies only to samples taken after fasting for at least 8 hours.   Glucose-Capillary 02/03/2022 148 (H)  70 - 99 mg/dL Final   Glucose reference range applies only to samples taken after fasting for at least 8 hours.   Glucose-Capillary 02/03/2022 122 (H)  70 - 99 mg/dL Final   Glucose reference range applies only to samples taken after fasting for at least 8 hours.   Glucose-Capillary 02/03/2022 233 (H)  70 - 99 mg/dL Final   Glucose reference range applies only to samples taken after fasting for at least 8 hours.   Glucose-Capillary 02/04/2022 126 (H)  70 - 99 mg/dL Final   Glucose reference range applies only to samples taken after fasting for at least 8 hours.   Glucose-Capillary 02/04/2022 117 (H)  70 - 99 mg/dL Final   Glucose reference range applies only to samples taken after fasting for at least 8 hours.   Glucose-Capillary 02/04/2022 135 (H)  70 - 99 mg/dL Final   Glucose reference range applies only to samples taken after fasting for at least 8 hours.   Glucose-Capillary 02/04/2022 197 (H)  70 - 99 mg/dL Final   Glucose reference range applies only to samples taken after fasting for at least 8 hours.   Glucose-Capillary 02/05/2022 170 (H)  70 - 99 mg/dL Final   Glucose reference range applies only to samples taken after fasting for at least 8 hours.   Glucose-Capillary 02/05/2022 107 (H)  70 - 99 mg/dL Final   Glucose reference range applies only to samples taken after fasting for at least 8 hours.   Glucose-Capillary 02/05/2022 184 (H)  70 - 99 mg/dL Final   Glucose reference range applies only to samples taken after fasting for at least 8 hours.   Glucose-Capillary 02/05/2022 256 (H)  70 - 99 mg/dL Final   Glucose reference range applies only to samples taken after fasting  for at least 8 hours.   Glucose-Capillary 02/06/2022 144 (H)  70 - 99 mg/dL Final   Glucose reference range applies only to samples taken after fasting for at least 8 hours.   Glucose-Capillary 02/06/2022 190 (  H)  70 - 99 mg/dL Final   Glucose reference range applies only to samples taken after fasting for at least 8 hours.   Glucose-Capillary 02/06/2022 92  70 - 99 mg/dL Final   Glucose reference range applies only to samples taken after fasting for at least 8 hours.   Trichomonas 02/06/2022 Negative   Final   Bacterial Vaginitis-Urine 02/06/2022 Negative   Final   Molecular Comment 02/06/2022 For tests bacteria and/or candida, this specimen does not meet the   Final   Molecular Comment 02/06/2022 strict criteria set by the FDA. The result interpretation should be   Final   Molecular Comment 02/06/2022 considered in conjunction with the patient's clinical history.   Final   Comment 02/06/2022 Normal Reference Range Trichomonas - Negative   Final   Glucose-Capillary 02/06/2022 197 (H)  70 - 99 mg/dL Final   Glucose reference range applies only to samples taken after fasting for at least 8 hours.   Glucose-Capillary 02/07/2022 129 (H)  70 - 99 mg/dL Final   Glucose reference range applies only to samples taken after fasting for at least 8 hours.   Glucose-Capillary 02/07/2022 207 (H)  70 - 99 mg/dL Final   Glucose reference range applies only to samples taken after fasting for at least 8 hours.   Glucose-Capillary 02/07/2022 153 (H)  70 - 99 mg/dL Final   Glucose reference range applies only to samples taken after fasting for at least 8 hours.   Glucose-Capillary 02/08/2022 129 (H)  70 - 99 mg/dL Final   Glucose reference range applies only to samples taken after fasting for at least 8 hours.   Glucose-Capillary 02/08/2022 107 (H)  70 - 99 mg/dL Final   Glucose reference range applies only to samples taken after fasting for at least 8 hours.  Admission on 01/23/2022, Discharged on 01/24/2022   Component Date Value Ref Range Status   Sodium 01/23/2022 140  135 - 145 mmol/L Final   Potassium 01/23/2022 4.4  3.5 - 5.1 mmol/L Final   Chloride 01/23/2022 101  98 - 111 mmol/L Final   CO2 01/23/2022 25  22 - 32 mmol/L Final   Glucose, Bld 01/23/2022 198 (H)  70 - 99 mg/dL Final   Glucose reference range applies only to samples taken after fasting for at least 8 hours.   BUN 01/23/2022 13  6 - 20 mg/dL Final   Creatinine, Ser 01/23/2022 0.62  0.44 - 1.00 mg/dL Final   Calcium 01/23/2022 9.3  8.9 - 10.3 mg/dL Final   GFR, Estimated 01/23/2022 >60  >60 mL/min Final   Comment: (NOTE) Calculated using the CKD-EPI Creatinine Equation (2021)    Anion gap 01/23/2022 14  5 - 15 Final   Performed at [facility], [place] [address], [place], [place] [address]   WBC 01/23/2022 5.8  4.0 - 10.5 K/uL Final   RBC 01/23/2022 4.63  3.87 - 5.11 MIL/uL Final   Hemoglobin 01/23/2022 11.7 (L)  12.0 - 15.0 g/dL Final   HCT 01/23/2022 37.4  36.0 - 46.0 % Final   MCV 01/23/2022 80.8  80.0 - 100.0 fL Final   MCH 01/23/2022 25.3 (L)  26.0 - 34.0 pg Final   MCHC 01/23/2022 31.3  30.0 - 36.0 g/dL Final   RDW 01/23/2022 17.9 (H)  11.5 - 15.5 % Final   Platelets 01/23/2022 333  150 - 400 K/uL Final   nRBC 01/23/2022 0.0  0.0 - 0.2 % Final   Performed at [facility], [place] [address] [place], [place] [address]   Troponin I (High Sensitivity) 01/23/2022 6  <18 ng/L Final   Comment: (NOTE) Elevated high sensitivity troponin I (hsTnI) values and significant  changes across serial measurements may suggest ACS but many other  chronic and acute conditions are known to elevate hsTnI results.  Refer to the "Links" section for chest pain algorithms and additional  guidance. Performed at [facility], [place] [address], [place], [place] [address]    Troponin I (High Sensitivity) 01/23/2022 5  <18 ng/L Final   Comment: (NOTE) Elevated high sensitivity troponin I (hsTnI) values and significant  changes  across serial measurements may suggest ACS but many other  chronic and acute conditions are known to elevate hsTnI results.  Refer to the "Links" section for chest pain algorithms and additional  guidance. Performed at [facility], [place] [address], [place], [place] [address]    SARS Coronavirus 2 by RT PCR 01/23/2022 NEGATIVE  NEGATIVE Final   Comment: (NOTE) SARS-CoV-2 target nucleic acids are NOT DETECTED.  The SARS-CoV-2 RNA is generally detectable in upper and lower respiratory specimens during the acute phase of infection. The lowest concentration of SARS-CoV-2 viral copies this assay can detect is 250 copies / mL. A negative result does not preclude SARS-CoV-2 infection and should not be used as the sole basis for treatment or other patient management decisions.  A negative result may occur with improper specimen collection / handling, submission of specimen other than nasopharyngeal swab, presence of viral mutation(s) within the areas targeted by this assay, and inadequate number of viral copies (<250 copies / mL). A negative result must be combined with clinical observations, patient history, and epidemiological information.  Fact Sheet for Patients:   https://www.patel.info/  Fact Sheet for Healthcare Providers: https://hall.com/  This test is not yet approved or                           cleared by the [place] FDA and has been authorized for detection and/or diagnosis of SARS-CoV-2 by FDA under an Emergency Use Authorization (EUA).  This EUA will remain in effect (meaning this test can be used) for the duration of the COVID-19 declaration under Section 564(b)(1) of the Act, 21 U.S.C. section 360bbb-3(b)(1), unless the authorization is terminated or revoked sooner.  Performed at [facility], [place] [address], [place], [place] [address]    B Natriuretic Peptide 01/23/2022 41.3  0.0 - 100.0 pg/mL Final    Performed at [facility] [address], [place], [place] [address]   Group A Strep by PCR 01/23/2022 NOT DETECTED  NOT DETECTED Final   Performed at [facility], [place] [address], [place], [place] [address]   Color, Urine 01/23/2022 YELLOW  YELLOW Final   APPearance 01/23/2022 CLEAR  CLEAR Final   Specific Gravity, Urine 01/23/2022 1.018  1.005 - 1.030 Final   pH 01/23/2022 7.0  5.0 - 8.0 Final   Glucose, UA 01/23/2022 NEGATIVE  NEGATIVE mg/dL Final   Hgb urine dipstick 01/23/2022 NEGATIVE  NEGATIVE Final   Bilirubin Urine 01/23/2022 NEGATIVE  NEGATIVE Final   Ketones, ur 01/23/2022 NEGATIVE  NEGATIVE mg/dL Final   Protein, ur 01/23/2022 NEGATIVE  NEGATIVE mg/dL Final   Nitrite 01/23/2022 NEGATIVE  NEGATIVE Final   Leukocytes,Ua 01/23/2022 TRACE (A)  NEGATIVE Final   RBC / HPF 01/23/2022 0-5  0 - 5 RBC/hpf Final  WBC, UA 01/23/2022 0-5  0 - 5 WBC/hpf Final   Bacteria, UA 01/23/2022 NONE SEEN  NONE SEEN Final   Squamous Epithelial / LPF 01/23/2022 0-5  0 - 5 Final   Mucus 01/23/2022 PRESENT   Final   Performed at [facility], [place] [address], [place], [place] [address]   SARS Coronavirus 2 by RT PCR 01/23/2022 NEGATIVE  NEGATIVE Final   Comment: (NOTE) SARS-CoV-2 target nucleic acids are NOT DETECTED.  The SARS-CoV-2 RNA is generally detectable in upper respiratory specimens during the acute phase of infection. The lowest concentration of SARS-CoV-2 viral copies this assay can detect is 138 copies/mL. A negative result does not preclude SARS-Cov-2 infection and should not be used as the sole basis for treatment or other patient management decisions. A negative result may occur with  improper specimen collection/handling, submission of specimen other than nasopharyngeal swab, presence of viral mutation(s) within the areas targeted by this assay, and inadequate number of viral copies(<138 copies/mL). A negative result must be combined with clinical observations,  patient history, and epidemiological information. The expected result is Negative.  Fact Sheet for Patients:  EntrepreneurPulse.com.au  Fact Sheet for Healthcare Providers:  IncredibleEmployment.be  This test is no                          t yet approved or cleared by the [place] FDA and  has been authorized for detection and/or diagnosis of SARS-CoV-2 by FDA under an Emergency Use Authorization (EUA). This EUA will remain  in effect (meaning this test can be used) for the duration of the COVID-19 declaration under Section 564(b)(1) of the Act, 21 U.S.C.section 360bbb-3(b)(1), unless the authorization is terminated  or revoked sooner.       Influenza A by PCR 01/23/2022 NEGATIVE  NEGATIVE Final   Influenza B by PCR 01/23/2022 NEGATIVE  NEGATIVE Final   Comment: (NOTE) The Xpert Xpress SARS-CoV-2/FLU/RSV plus assay is intended as an aid in the diagnosis of influenza from Nasopharyngeal swab specimens and should not be used as a sole basis for treatment. Nasal washings and aspirates are unacceptable for Xpert Xpress SARS-CoV-2/FLU/RSV testing.  Fact Sheet for Patients: EntrepreneurPulse.com.au  Fact Sheet for Healthcare Providers: IncredibleEmployment.be  This test is not yet approved or cleared by the [place] FDA and has been authorized for detection and/or diagnosis of SARS-CoV-2 by FDA under an Emergency Use Authorization (EUA). This EUA will remain in effect (meaning this test can be used) for the duration of the COVID-19 declaration under Section 564(b)(1) of the Act, 21 U.S.C. section 360bbb-3(b)(1), unless the authorization is terminated or revoked.  Performed at [facility], [place] [address] [place], [place] [address]    WBC 01/24/2022 6.1  4.0 - 10.5 K/uL Final   RBC 01/24/2022 4.60  3.87 - 5.11 MIL/uL Final   Hemoglobin 01/24/2022 11.7 (L)  12.0 - 15.0 g/dL Final   HCT  01/24/2022 37.0  36.0 - 46.0 % Final   MCV 01/24/2022 80.4  80.0 - 100.0 fL Final   MCH 01/24/2022 25.4 (L)  26.0 - 34.0 pg Final   MCHC 01/24/2022 31.6  30.0 - 36.0 g/dL Final   RDW 01/24/2022 17.6 (H)  11.5 - 15.5 % Final   Platelets 01/24/2022 334  150 - 400 K/uL Final   nRBC 01/24/2022 0.0  0.0 - 0.2 % Final   Neutrophils Relative % 01/24/2022 53  % Final   Neutro Abs 01/24/2022  3.3  1.7 - 7.7 K/uL Final   Lymphocytes Relative 01/24/2022 33  % Final   Lymphs Abs 01/24/2022 2.0  0.7 - 4.0 K/uL Final   Monocytes Relative 01/24/2022 11  % Final   Monocytes Absolute 01/24/2022 0.7  0.1 - 1.0 K/uL Final   Eosinophils Relative 01/24/2022 2  % Final   Eosinophils Absolute 01/24/2022 0.1  0.0 - 0.5 K/uL Final   Basophils Relative 01/24/2022 1  % Final   Basophils Absolute 01/24/2022 0.1  0.0 - 0.1 K/uL Final   Immature Granulocytes 01/24/2022 0  % Final   Abs Immature Granulocytes 01/24/2022 0.02  0.00 - 0.07 K/uL Final   Performed at [facility] [address] [place], [place] [address]   Glucose-Capillary 01/24/2022 110 (H)  70 - 99 mg/dL Final   Glucose reference range applies only to samples taken after fasting for at least 8 hours.  No results displayed because visit has over 200 results.    Admission on 11/22/2021, Discharged on 11/22/2021  Component Date Value Ref Range Status   Glucose-Capillary 11/22/2021 169 (H)  70 - 99 mg/dL Final   Glucose reference range applies only to samples taken after fasting for at least 8 hours.  No results displayed because visit has over 200 results.    Admission on 10/04/2021, Discharged on 10/22/2021  Component Date Value Ref Range Status   SARS Coronavirus 2 by RT PCR 10/04/2021 NEGATIVE  NEGATIVE Final   Comment: (NOTE) SARS-CoV-2 target nucleic acids are NOT DETECTED.  The SARS-CoV-2 RNA is generally detectable in upper respiratory specimens during the acute phase of infection. The lowest concentration of SARS-CoV-2 viral copies  this assay can detect is 138 copies/mL. A negative result does not preclude SARS-Cov-2 infection and should not be used as the sole basis for treatment or other patient management decisions. A negative result may occur with  improper specimen collection/handling, submission of specimen other than nasopharyngeal swab, presence of viral mutation(s) within the areas targeted by this assay, and inadequate number of viral copies(<138 copies/mL). A negative result must be combined with clinical observations, patient history, and epidemiological information. The expected result is Negative.  Fact Sheet for Patients:  EntrepreneurPulse.com.au  Fact Sheet for Healthcare Providers:  IncredibleEmployment.be  This test is no                          t yet approved or cleared by the [place] FDA and  has been authorized for detection and/or diagnosis of SARS-CoV-2 by FDA under an Emergency Use Authorization (EUA). This EUA will remain  in effect (meaning this test can be used) for the duration of the COVID-19 declaration under Section 564(b)(1) of the Act, 21 U.S.C.section 360bbb-3(b)(1), unless the authorization is terminated  or revoked sooner.       Influenza A by PCR 10/04/2021 NEGATIVE  NEGATIVE Final   Influenza B by PCR 10/04/2021 NEGATIVE  NEGATIVE Final   Comment: (NOTE) The Xpert Xpress SARS-CoV-2/FLU/RSV plus assay is intended as an aid in the diagnosis of influenza from Nasopharyngeal swab specimens and should not be used as a sole basis for treatment. Nasal washings and aspirates are unacceptable for Xpert Xpress SARS-CoV-2/FLU/RSV testing.  Fact Sheet for Patients: EntrepreneurPulse.com.au  Fact Sheet for Healthcare Providers: IncredibleEmployment.be  This test is not yet approved or cleared by the [place] FDA and has been authorized for detection and/or diagnosis of SARS-CoV-2 by FDA under  an Emergency Use  Authorization (EUA). This EUA will remain in effect (meaning this test can be used) for the duration of the COVID-19 declaration under Section 564(b)(1) of the Act, 21 U.S.C. section 360bbb-3(b)(1), unless the authorization is terminated or revoked.  Performed at [facility], [place] [address], [place], [place] [address]    WBC 10/04/2021 8.4  4.0 - 10.5 K/uL Final   RBC 10/04/2021 4.42  3.87 - 5.11 MIL/uL Final   Hemoglobin 10/04/2021 11.6 (L)  12.0 - 15.0 g/dL Final   HCT 10/04/2021 35.7 (L)  36.0 - 46.0 % Final   MCV 10/04/2021 80.8  80.0 - 100.0 fL Final   MCH 10/04/2021 26.2  26.0 - 34.0 pg Final   MCHC 10/04/2021 32.5  30.0 - 36.0 g/dL Final   RDW 10/04/2021 16.0 (H)  11.5 - 15.5 % Final   Platelets 10/04/2021 372  150 - 400 K/uL Final   nRBC 10/04/2021 0.0  0.0 - 0.2 % Final   Neutrophils Relative % 10/04/2021 68  % Final   Neutro Abs 10/04/2021 5.7  1.7 - 7.7 K/uL Final   Lymphocytes Relative 10/04/2021 22  % Final   Lymphs Abs 10/04/2021 1.8  0.7 - 4.0 K/uL Final   Monocytes Relative 10/04/2021 8  % Final   Monocytes Absolute 10/04/2021 0.7  0.1 - 1.0 K/uL Final   Eosinophils Relative 10/04/2021 1  % Final   Eosinophils Absolute 10/04/2021 0.1  0.0 - 0.5 K/uL Final   Basophils Relative 10/04/2021 1  % Final   Basophils Absolute 10/04/2021 0.1  0.0 - 0.1 K/uL Final   Immature Granulocytes 10/04/2021 0  % Final   Abs Immature Granulocytes 10/04/2021 0.03  0.00 - 0.07 K/uL Final   Performed at [facility], [place] [address] [place], [place] [address]   Sodium 10/04/2021 136  135 - 145 mmol/L Final   Potassium 10/04/2021 4.2  3.5 - 5.1 mmol/L Final   Chloride 10/04/2021 104  98 - 111 mmol/L Final   CO2 10/04/2021 25  22 - 32 mmol/L Final   Glucose, Bld 10/04/2021 100 (H)  70 - 99 mg/dL Final   Glucose reference range applies only to samples taken after fasting for at least 8 hours.   BUN 10/04/2021 9  6 - 20 mg/dL Final   Creatinine, Ser  10/04/2021 0.52  0.44 - 1.00 mg/dL Final   Calcium 10/04/2021 9.0  8.9 - 10.3 mg/dL Final   Total Protein 10/04/2021 7.0  6.5 - 8.1 g/dL Final   Albumin 10/04/2021 3.2 (L)  3.5 - 5.0 g/dL Final   AST 10/04/2021 13 (L)  15 - 41 U/L Final   ALT 10/04/2021 10  0 - 44 U/L Final   Alkaline Phosphatase 10/04/2021 61  38 - 126 U/L Final   Total Bilirubin 10/04/2021 0.3  0.3 - 1.2 mg/dL Final   GFR, Estimated 10/04/2021 >60  >60 mL/min Final   Comment: (NOTE) Calculated using the CKD-EPI Creatinine Equation (2021)    Anion gap 10/04/2021 7  5 - 15 Final   Performed at [facility] [address], [place], [place] [address]   Hgb A1c MFr Bld 10/04/2021 7.0 (H)  4.8 - 5.6 % Final   Comment: (NOTE) Pre diabetes:          5.7%-6.4%  Diabetes:              >6.4%  Glycemic control for   <7.0% adults with diabetes    Mean Plasma Glucose  10/04/2021 154.2  mg/dL Final   Performed at [facility] [address], [place], [place] [address]   Cholesterol 10/04/2021 178  0 - 200 mg/dL Final   Triglycerides 10/04/2021 155 (H)  <150 mg/dL Final   HDL 10/04/2021 52  >40 mg/dL Final   Total CHOL/HDL Ratio 10/04/2021 3.4  RATIO Final   VLDL 10/04/2021 31  0 - 40 mg/dL Final   LDL Cholesterol 10/04/2021 95  0 - 99 mg/dL Final   Comment:        Total Cholesterol/HDL:CHD Risk Coronary Heart Disease Risk Table                     Men   Women  1/2 Average Risk   3.4   3.3  Average Risk       5.0   4.4  2 X Average Risk   9.6   7.1  3 X Average Risk  23.4   11.0        Use the calculated Patient Ratio above and the CHD Risk Table to determine the patient's CHD Risk.        ATP III CLASSIFICATION (LDL):  <100     mg/dL   Optimal  100-129  mg/dL   Near or Above                    Optimal  130-159  mg/dL   Borderline  160-189  mg/dL   High  >190     mg/dL   Very High Performed at [facility] [address] [place], [place] [address]    POC Amphetamine UR 10/04/2021 None  Detected  NONE DETECTED (Cut Off Level 1000 ng/mL) Final   POC Secobarbital (BAR) 10/04/2021 None Detected  NONE DETECTED (Cut Off Level 300 ng/mL) Final   POC Buprenorphine (BUP) 10/04/2021 None Detected  NONE DETECTED (Cut Off Level 10 ng/mL) Final   POC Oxazepam (BZO) 10/04/2021 None Detected  NONE DETECTED (Cut Off Level 300 ng/mL) Final   POC Cocaine UR 10/04/2021 None Detected  NONE DETECTED (Cut Off Level 300 ng/mL) Final   POC Methamphetamine UR 10/04/2021 None Detected  NONE DETECTED (Cut Off Level 1000 ng/mL) Final   POC Morphine 10/04/2021 None Detected  NONE DETECTED (Cut Off Level 300 ng/mL) Final   POC Methadone UR 10/04/2021 None Detected  NONE DETECTED (Cut Off Level 300 ng/mL) Final   POC Oxycodone UR 10/04/2021 Positive (A)  NONE DETECTED (Cut Off Level 100 ng/mL) Final   POC Marijuana UR 10/04/2021 None Detected  NONE DETECTED (Cut Off Level 50 ng/mL) Final   SARSCOV2ONAVIRUS 2 AG 10/04/2021 NEGATIVE  NEGATIVE Final   Comment: (NOTE) SARS-CoV-2 antigen NOT DETECTED.   Negative results are presumptive.  Negative results do not preclude SARS-CoV-2 infection and should not be used as the sole basis for treatment or other patient management decisions, including infection  control decisions, particularly in the presence of clinical signs and  symptoms consistent with COVID-19, or in those who have been in contact with the virus.  Negative results must be combined with clinical observations, patient history, and epidemiological information. The expected result is Negative.  Fact Sheet for Patients: HandmadeRecipes.com.cy  Fact Sheet for Healthcare Providers: FuneralLife.at  This test is not yet approved or cleared by the [place] FDA and  has been authorized for detection and/or diagnosis of SARS-CoV-2 by FDA under an Emergency Use Authorization (EUA).  This EUA will remain in  effect (meaning this test can be used) for the  duration of  the COV                          ID-19 declaration under Section 564(b)(1) of the Act, 21 U.S.C. section 360bbb-3(b)(1), unless the authorization is terminated or revoked sooner.     Valproic Acid Lvl 10/04/2021 51  50.0 - 100.0 ug/mL Final   Performed at [facility] [address] [place], [place], [place] [address]   Valproic Acid Lvl 10/08/2021 57  50.0 - 100.0 ug/mL Final   Performed at [facility] [address] [place], [place] [address]   Glucose-Capillary 10/09/2021 123 (H)  70 - 99 mg/dL Final   Glucose reference range applies only to samples taken after fasting for at least 8 hours.  There may be more visits with results that are not included.    Blood Alcohol level:  Lab Results  Component Value Date   ETH <10 11/23/2021   ETH <10 76/28/3151    Metabolic Disorder Labs: Lab Results  Component Value Date   HGBA1C 6.7 (H) 12/28/2021   MPG 145.59 12/28/2021   MPG 154.2 10/04/2021   Lab Results  Component Value Date   PROLACTIN 3.8 (L) 11/23/2021   Lab Results  Component Value Date   CHOL 171 11/23/2021   TRIG 125 11/23/2021   HDL 65 11/23/2021   CHOLHDL 2.6 11/23/2021   VLDL 25 11/23/2021   LDLCALC 81 11/23/2021   LDLCALC 95 10/04/2021    Therapeutic Lab Levels: No results found for: "LITHIUM" Lab Results  Component Value Date   VALPROATE 17 (L) 02/17/2022   VALPROATE 33 (L) 01/18/2022   No results found for: "CBMZ"  Physical Findings   PHQ2-9    Flowsheet Row ED from 11/23/2021 in [facility]  PHQ-2 Total Score 2  PHQ-9 Total Score 3      Flowsheet Row ED from 02/18/2022 in [facility] ED from 02/17/2022 in [facility] ED from 02/09/2022 in [facility] No Risk No Risk No Risk       Psychiatric Specialty Exam  Presentation  General Appearance:  Appropriate for Environment;  Casual; Fairly Groomed  Eye Contact: Good  Speech: Clear and Coherent; Normal Rate  Speech Volume: Normal  Handedness: Right   Mood and Affect  Mood: Euthymic  Affect: Appropriate; Congruent; Constricted   Thought Process  Thought Processes: Coherent; Goal Directed; Linear  Descriptions of Associations:Intact  Orientation:Full (Time, Place and Person)  Thought Content:Logical; WDL  Diagnosis of Schizophrenia or Schizoaffective disorder in past: Yes  Duration of Psychotic Symptoms: NA  Hallucinations:Hallucinations: None  Ideas of Reference:None  Suicidal Thoughts:Suicidal Thoughts: No  Homicidal Thoughts:Homicidal Thoughts: No   Sensorium  Memory: Immediate Good  Judgment: Fair  Insight: Fair   Community education officer  Concentration: Good  Attention Span: Good  Recall: Good  Fund of Knowledge: Good  Language: Good   Psychomotor Activity  Psychomotor Activity: Psychomotor Activity: Normal   Assets  Assets: Communication Skills; Desire for Improvement   Sleep  Sleep: Sleep: Good   Physical Exam  Physical Exam Vitals reviewed.  Constitutional:      General: She is not in acute distress.    Appearance: She is not toxic-appearing.  HENT:     Head: Normocephalic.  Eyes:     Extraocular Movements: Extraocular movements  intact.     Pupils: Pupils are equal, round, and reactive to light.  Cardiovascular:     Rate and Rhythm: Tachycardia present.  Pulmonary:     Effort: Pulmonary effort is normal.     Breath sounds: Normal breath sounds.  Musculoskeletal:     Cervical back: Normal range of motion.     Right lower leg: No edema.     Left lower leg: No edema.  Neurological:     General: No focal deficit present.     Mental Status: She is alert and oriented to person, place, and time.    Review of Systems  Respiratory:  Negative for shortness of breath.   Cardiovascular:  Negative for chest pain.  Gastrointestinal:   Negative for nausea and vomiting.  Neurological:  Negative for dizziness and headaches.  Psychiatric/Behavioral: Negative.     Blood pressure 108/82, pulse (!) 107, temperature 97.7 F (36.5 C), temperature source Oral, resp. rate 20, SpO2 98 %. There is no height or weight on file to calculate BMI.   Plan:  Coordination of Care with TOC /SW and DSS to secure placement for patient Patient has been psychiatrically cleared and does not meet criteria for inpatient psychiatric treatment.  Disposition is pending possible placement at [facility].    [name], [id number] 02/28/2022 10:02 AM

## 2022-02-28 NOTE — ED Notes (Signed)
Pt resting quietly, breathing is even and unlabored.  Pt denies SI, HI, pain and AVH.  Pt provided staff with laundry to be washed and placed in locker.  Items currently in wash. Will continue to monitor for safety.

## 2022-02-28 NOTE — ED Notes (Signed)
Pt resting quietly with eyes closed.  No pain or discomfort noted/voiced.  Breathing is even and unlabored.  Will continue to monitor for safety.  

## 2022-02-28 NOTE — ED Notes (Signed)
Patient  sleeping in no acute stress. RR even and unlabored .Environment secured .Will continue to monitor for safely. 

## 2022-03-01 DIAGNOSIS — F209 Schizophrenia, unspecified: Secondary | ICD-10-CM | POA: Diagnosis not present

## 2022-03-01 LAB — GLUCOSE, CAPILLARY
Glucose-Capillary: 134 mg/dL — ABNORMAL HIGH (ref 70–99)
Glucose-Capillary: 116 mg/dL — ABNORMAL HIGH (ref 70–99)
Glucose-Capillary: 227 mg/dL — ABNORMAL HIGH (ref 70–99)
Glucose-Capillary: 157 mg/dL — ABNORMAL HIGH (ref 70–99)

## 2022-03-01 NOTE — ED Provider Notes (Signed)
Behavioral Health Progress Note  Date and Time: 03/01/2022 10:08 AM Name: Teresa Murillo MRN:  [id number]    Diagnosis:  Final diagnoses:  Schizophrenia, unspecified type (Teresa Murillo)    Total Time spent with patient: 15 minutes  Brief HPI: Teresa Murillo, 62 year old, female, with schizophrenia, aggressive behavior,  IDD (mild), admitted to  [facility] on 11/23/21 voluntarily, accompanied by [employer], and reported that she was locked out of her group home. It was later confirmed that Teresa Murillo was dismissed from group home due to recurrent history of eloping from the group home. Patient was evaluated here subsequently discharged from [facility], however has remained at this facility as a boarder while DSS continues efforts to find permanent placement for Teresa Murillo.   Brief summary of cognitive evaluations completed during stay here at [facility] per chart review:  (AID to Capacity Evaluation (ACE) completed by Dr. Louis Meckel, Dr. Dwyane Dee on 12/11/2021.  Please see media tab for full details.    Teresa Murillo completed: Wechsler Adult Intelligence Scale-4, Ms. Teresa Murillo achieved a full-scale IQ score of 73 and a percentile rank of 4 placing her in the borderline range of intellectual functioning (12/14/2021) Please see consult note from Teresa Murillo on 12/14/2021.)   Patient continues to be psychiatrically cleared and has no apparent acute psychiatric crisis.  Patient does not meet inpatient psychiatric treatment criteria.  Current plan as of 02/28/2022: Disposition is pending placement, possible placement at [facility].  Subjective:   Patient was initially seen asleep, awoken easily, no acute distress. Patient was pleasant, calm, cooperative with evaluation. Patient reported to still being bored, but good. Reported good sleep and appetite. She has no somatic concerns. Denied medication side effects and tolerated them well. Patient denied SI/HI/AVH, paranoia, and contracted to safety. Patient  declined any other questions or concerns and is amenable to plan per below.    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 On the Wechsler Adult Intelligence Scale-4, Ms. Crock achieved a full-scale IQ score of 73 and a percentile rank of 4 placing her in the borderline range of intellectual functioning.    Chronic obstructive pulmonary disease (COPD) (HCC)    Glaucoma    Hyperlipidemia    Hypertension    Iron deficiency    Schizoaffective disorder (HCC)    Type 2 diabetes mellitus (HCC)     Past Surgical History:  Procedure Laterality Date   TUBAL LIGATION     Family History:  Family History  Problem Relation Age of Onset   Breast cancer Maternal Grandmother     Social History:  Social History   Substance and Sexual Activity  Alcohol Use Not Currently     Social History   Substance and Sexual Activity  Drug Use Not Currently    Social History   Socioeconomic History   Marital status: Divorced    Spouse name: Not on file   Number of children: Not on file   Years of education: Not on file   Highest education level: Not on file  Occupational History   Not on file  Tobacco Use   Smoking status: Former    Packs/day: 1.00    Types: Cigars, Cigarettes    Quit date: 11/09/2021    Years since quitting: 0.3   Smokeless tobacco: Current  Vaping Use   Vaping Use: Never used  Substance and Sexual Activity  Alcohol use: Not Currently   Drug use: Not Currently   Sexual activity: Not Currently    Birth control/protection: Surgical  Other Topics Concern   Not on file  Social History Narrative   Not on file   Social Determinants of Health   Financial Resource Strain: Not on file  Food Insecurity: Not on file  Transportation Needs: Not on file  Physical Activity: Not on file  Stress: Not on file  Social Connections: Not on file    SDOH:  SDOH Screenings   Depression (PHQ2-9): Low Risk  (11/23/2021)  Tobacco Use: High Risk (02/17/2022)  Current Medications:  Current Facility-Administered Medications  Medication Dose Route Frequency Provider Last Rate Last Admin   acetaminophen (TYLENOL) tablet 650 mg  650 mg Oral Q6H PRN [name], NP   650 mg at 02/27/22 2132   albuterol (VENTOLIN HFA) 108 (90 Base) MCG/ACT inhaler 2 puff  2 puff Inhalation Q6H PRN [name], NP   2 puff at 02/20/22 1944   alum & mag hydroxide-simeth (MAALOX/MYLANTA) 200-200-20 MG/5ML suspension 30 mL  30 mL Oral Q4H PRN [name], NP   30 mL at 02/28/22 0934   apixaban (ELIQUIS) tablet 5 mg  5 mg Oral BID [name], NP   5 mg at 03/01/22 0830   ARIPiprazole (ABILIFY) tablet 10 mg  10 mg Oral Daily [name], NP   10 mg at 03/01/22 0829   cloZAPine (CLOZARIL) tablet 75 mg  75 mg Oral BID France Ravens, MD   75 mg at 03/01/22 0354   colchicine tablet 0.6 mg  0.6 mg Oral QHS [name], NP   0.6 mg at 02/28/22 2107   diltiazem (CARDIZEM CD) 24 hr capsule 240 mg  240 mg Oral Daily [name], NP   240 mg at 03/01/22 0829   divalproex (DEPAKOTE ER) 24 hr tablet 500 mg  500 mg Oral BID [name], NP   500 mg at 03/01/22 6568   haloperidol (HALDOL) tablet 10 mg  10 mg Oral TID PRN [name], NP       insulin aspart (novoLOG) injection 0-9 Units  0-9 Units Subcutaneous TID WC France Ravens, MD   1 Units at 03/01/22 1275   insulin glargine-yfgn (SEMGLEE) injection 12 Units  12 Units Subcutaneous Daily [name], NP   12 Units at 03/01/22 0910   latanoprost (XALATAN) 0.005 % ophthalmic solution 1 drop  1 drop Both Eyes QHS [name], NP   1 drop at 02/28/22 2108   magnesium hydroxide (MILK OF MAGNESIA) suspension 30 mL  30 mL Oral Daily PRN [name], NP       metFORMIN (GLUCOPHAGE) tablet 500 mg  500 mg Oral BID WC [name], NP   500 mg at 03/01/22 1700   mometasone-formoterol (DULERA) 200-5 MCG/ACT inhaler 2 puff   2 puff Inhalation BID [name], NP   2 puff at 03/01/22 1749   nicotine (NICODERM CQ - dosed in mg/24 hr) patch 7 mg  7 mg Transdermal Daily [name], NP   7 mg at 03/01/22 0830   valbenazine (INGREZZA) capsule 40 mg  40 mg Oral q AM [name], NP   40 mg at 03/01/22 0601   Vitamin D (Ergocalciferol) (DRISDOL) 1.25 MG (50000 UNIT) capsule 50,000 Units  50,000 Units Oral Q Leonie Green, NP   50,000 Units at 02/25/22 4496   Current Outpatient Medications  Medication Sig Dispense Refill   Accu-Chek Softclix Lancets lancets Use as directed  up to four times daily 100 each 0   apixaban (ELIQUIS) 5 MG TABS tablet Take 1 tablet (5 mg total) by mouth 2 (two) times daily. 60 tablet 0   ARIPiprazole (ABILIFY) 10 MG tablet Take 1 tablet (10 mg total) by mouth daily. 30 tablet 0   Blood Glucose Monitoring Suppl (ACCU-CHEK GUIDE) w/Device KIT Use as directed up to four times daily 1 kit 0   budesonide-formoterol (SYMBICORT) 160-4.5 MCG/ACT inhaler Inhale 2 puffs into the lungs in the morning and at bedtime.     Cholecalciferol (VITAMIN D3) 1.25 MG (50000 UT) CAPS Take 50,000 Units by mouth every Thursday.     clozapine (CLOZARIL) 50 MG tablet Take 1 tablet (50 mg total) by mouth daily. 30 tablet 0   colchicine 0.6 MG tablet Take 1 tablet (0.6 mg total) by mouth at bedtime. 30 tablet 0   diltiazem (CARDIZEM CD) 240 MG 24 hr capsule Take 1 capsule (240 mg total) by mouth daily. (Patient not taking: Reported on 11/24/2021) 30 capsule 0   divalproex (DEPAKOTE ER) 500 MG 24 hr tablet Take 1 tablet (500 mg total) by mouth 2 (two) times daily. 60 tablet 0   glucose blood test strip Use as directed up to four times daily 50 each 0   haloperidol (HALDOL) 10 MG tablet Take 1 tablet (10 mg total) by mouth 3 (three) times daily as needed for agitation (and psychotic symptoms).     INGREZZA 40 MG capsule Take 1 capsule (40 mg total) by mouth in the morning. 30 capsule 0   insulin aspart (NOVOLOG) 100  UNIT/ML FlexPen Before each meal 3 times a day, 140-199 - 2 units, 200-250 - 4 units, 251-299 - 6 units,  300-349 - 8 units,  350 or above 10 units. Insulin PEN if approved, provide syringes and needles if needed.Please switch to any approved short acting Insulin if needed. 15 mL 0   insulin glargine (LANTUS) 100 UNIT/ML Solostar Pen Inject 12 Units into the skin daily. 15 mL 0   Insulin Pen Needle 32G X 4 MM MISC Use 4 times a day with insulin, 1 month supply. 100 each 0   latanoprost (XALATAN) 0.005 % ophthalmic solution Place 1 drop into both eyes at bedtime.     metFORMIN (GLUCOPHAGE) 500 MG tablet Take 500 mg by mouth 2 (two) times daily with a meal.     nicotine (NICODERM CQ - DOSED IN MG/24 HR) 7 mg/24hr patch Place 1 patch (7 mg total) onto the skin daily. 28 patch 0   PROAIR HFA 108 (90 Base) MCG/ACT inhaler Inhale 2 puffs into the lungs every 6 (six) hours as needed for wheezing or shortness of breath.      Labs  Lab Results:  Admission on 02/18/2022  Component Date Value Ref Range Status   Glucose-Capillary 02/18/2022 254 (H)  70 - 99 mg/dL Final   Glucose reference range applies only to samples taken after fasting for at least 8 hours.   Glucose-Capillary 02/18/2022 112 (H)  70 - 99 mg/dL Final   Glucose reference range applies only to samples taken after fasting for at least 8 hours.   Glucose-Capillary 02/19/2022 159 (H)  70 - 99 mg/dL Final   Glucose reference range applies only to samples taken after fasting for at least 8 hours.   Glucose-Capillary 02/19/2022 160 (H)  70 - 99 mg/dL Final   Glucose reference range applies only to samples taken after fasting for at least 8 hours.  Glucose-Capillary 02/19/2022 177 (H)  70 - 99 mg/dL Final   Glucose reference range applies only to samples taken after fasting for at least 8 hours.   Glucose-Capillary 02/19/2022 147 (H)  70 - 99 mg/dL Final   Glucose reference range applies only to samples taken after fasting for at least 8  hours.   Glucose-Capillary 02/20/2022 126 (H)  70 - 99 mg/dL Final   Glucose reference range applies only to samples taken after fasting for at least 8 hours.   Glucose-Capillary 02/20/2022 132 (H)  70 - 99 mg/dL Final   Glucose reference range applies only to samples taken after fasting for at least 8 hours.   Glucose-Capillary 02/20/2022 125 (H)  70 - 99 mg/dL Final   Glucose reference range applies only to samples taken after fasting for at least 8 hours.   Glucose-Capillary 02/21/2022 151 (H)  70 - 99 mg/dL Final   Glucose reference range applies only to samples taken after fasting for at least 8 hours.   Glucose-Capillary 02/21/2022 106 (H)  70 - 99 mg/dL Final   Glucose reference range applies only to samples taken after fasting for at least 8 hours.   Glucose-Capillary 02/21/2022 149 (H)  70 - 99 mg/dL Final   Glucose reference range applies only to samples taken after fasting for at least 8 hours.   Glucose-Capillary 02/22/2022 131 (H)  70 - 99 mg/dL Final   Glucose reference range applies only to samples taken after fasting for at least 8 hours.   Glucose-Capillary 02/22/2022 141 (H)  70 - 99 mg/dL Final   Glucose reference range applies only to samples taken after fasting for at least 8 hours.   Glucose-Capillary 02/22/2022 183 (H)  70 - 99 mg/dL Final   Glucose reference range applies only to samples taken after fasting for at least 8 hours.   Glucose-Capillary 02/23/2022 133 (H)  70 - 99 mg/dL Final   Glucose reference range applies only to samples taken after fasting for at least 8 hours.   WBC 02/23/2022 4.8  4.0 - 10.5 K/uL Final   RBC 02/23/2022 3.96  3.87 - 5.11 MIL/uL Final   Hemoglobin 02/23/2022 9.7 (L)  12.0 - 15.0 g/dL Final   HCT 02/23/2022 30.6 (L)  36.0 - 46.0 % Final   MCV 02/23/2022 77.3 (L)  80.0 - 100.0 fL Final   MCH 02/23/2022 24.5 (L)  26.0 - 34.0 pg Final   MCHC 02/23/2022 31.7  30.0 - 36.0 g/dL Final   RDW 02/23/2022 16.5 (H)  11.5 - 15.5 % Final    Platelets 02/23/2022 372  150 - 400 K/uL Final   nRBC 02/23/2022 0.0  0.0 - 0.2 % Final   Neutrophils Relative % 02/23/2022 56  % Final   Neutro Abs 02/23/2022 2.7  1.7 - 7.7 K/uL Final   Lymphocytes Relative 02/23/2022 32  % Final   Lymphs Abs 02/23/2022 1.5  0.7 - 4.0 K/uL Final   Monocytes Relative 02/23/2022 9  % Final   Monocytes Absolute 02/23/2022 0.4  0.1 - 1.0 K/uL Final   Eosinophils Relative 02/23/2022 2  % Final   Eosinophils Absolute 02/23/2022 0.1  0.0 - 0.5 K/uL Final   Basophils Relative 02/23/2022 1  % Final   Basophils Absolute 02/23/2022 0.1  0.0 - 0.1 K/uL Final   Immature Granulocytes 02/23/2022 0  % Final   Abs Immature Granulocytes 02/23/2022 0.01  0.00 - 0.07 K/uL Final   Performed at [facility], [address] [place], [place] [address]   Glucose-Capillary 02/23/2022 202 (H)  70 - 99 mg/dL Final   Glucose reference range applies only to samples taken after fasting for at least 8 hours.   Glucose-Capillary 02/23/2022 143 (H)  70 - 99 mg/dL Final   Glucose reference range applies only to samples taken after fasting for at least 8 hours.   Glucose-Capillary 02/23/2022 143 (H)  70 - 99 mg/dL Final   Glucose reference range applies only to samples taken after fasting for at least 8 hours.   Glucose-Capillary 02/24/2022 207 (H)  70 - 99 mg/dL Final   Glucose reference range applies only to samples taken after fasting for at least 8 hours.   Glucose-Capillary 02/24/2022 109 (H)  70 - 99 mg/dL Final   Glucose reference range applies only to samples taken after fasting for at least 8 hours.   Glucose-Capillary 02/24/2022 127 (H)  70 - 99 mg/dL Final   Glucose reference range applies only to samples taken after fasting for at least 8 hours.   Glucose-Capillary 02/24/2022 130 (H)  70 - 99 mg/dL Final   Glucose reference range applies only to samples taken after fasting for at least 8 hours.   Glucose-Capillary 02/25/2022 117 (H)  70 - 99 mg/dL Final   Glucose  reference range applies only to samples taken after fasting for at least 8 hours.   Glucose-Capillary 02/25/2022 104 (H)  70 - 99 mg/dL Final   Glucose reference range applies only to samples taken after fasting for at least 8 hours.   Glucose-Capillary 02/25/2022 180 (H)  70 - 99 mg/dL Final   Glucose reference range applies only to samples taken after fasting for at least 8 hours.   Glucose-Capillary 02/25/2022 180 (H)  70 - 99 mg/dL Final   Glucose reference range applies only to samples taken after fasting for at least 8 hours.   Glucose-Capillary 02/26/2022 133 (H)  70 - 99 mg/dL Final   Glucose reference range applies only to samples taken after fasting for at least 8 hours.   Glucose-Capillary 02/26/2022 85  70 - 99 mg/dL Final   Glucose reference range applies only to samples taken after fasting for at least 8 hours.   Glucose-Capillary 02/26/2022 136 (H)  70 - 99 mg/dL Final   Glucose reference range applies only to samples taken after fasting for at least 8 hours.   Glucose-Capillary 02/26/2022 170 (H)  70 - 99 mg/dL Final   Glucose reference range applies only to samples taken after fasting for at least 8 hours.   Glucose-Capillary 02/26/2022 185 (H)  70 - 99 mg/dL Final   Glucose reference range applies only to samples taken after fasting for at least 8 hours.   Glucose-Capillary 02/27/2022 124 (H)  70 - 99 mg/dL Final   Glucose reference range applies only to samples taken after fasting for at least 8 hours.   Glucose-Capillary 02/27/2022 161 (H)  70 - 99 mg/dL Final   Glucose reference range applies only to samples taken after fasting for at least 8 hours.   Glucose-Capillary 02/27/2022 170 (H)  70 - 99 mg/dL Final   Glucose reference range applies only to samples taken after fasting for at least 8 hours.   Glucose-Capillary 02/27/2022 154 (H)  70 - 99 mg/dL Final   Glucose reference range applies only to samples taken after fasting for at least 8 hours.   Glucose-Capillary  02/28/2022 109 (H)  70 - 99 mg/dL Final  Glucose reference range applies only to samples taken after fasting for at least 8 hours.   Glucose-Capillary 02/28/2022 134 (H)  70 - 99 mg/dL Final   Glucose reference range applies only to samples taken after fasting for at least 8 hours.   Glucose-Capillary 02/28/2022 160 (H)  70 - 99 mg/dL Final   Glucose reference range applies only to samples taken after fasting for at least 8 hours.   Glucose-Capillary 02/28/2022 136 (H)  70 - 99 mg/dL Final   Glucose reference range applies only to samples taken after fasting for at least 8 hours.   Glucose-Capillary 02/28/2022 173 (H)  70 - 99 mg/dL Final   Glucose reference range applies only to samples taken after fasting for at least 8 hours.   Glucose-Capillary 03/01/2022 134 (H)  70 - 99 mg/dL Final   Glucose reference range applies only to samples taken after fasting for at least 8 hours.  Admission on 02/17/2022, Discharged on 02/18/2022  Component Date Value Ref Range Status   Sodium 02/17/2022 136  135 - 145 mmol/L Final   Potassium 02/17/2022 3.9  3.5 - 5.1 mmol/L Final   Chloride 02/17/2022 101  98 - 111 mmol/L Final   CO2 02/17/2022 25  22 - 32 mmol/L Final   Glucose, Bld 02/17/2022 113 (H)  70 - 99 mg/dL Final   Glucose reference range applies only to samples taken after fasting for at least 8 hours.   BUN 02/17/2022 9  6 - 20 mg/dL Final   Creatinine, Ser 02/17/2022 0.50  0.44 - 1.00 mg/dL Final   Calcium 02/17/2022 8.9  8.9 - 10.3 mg/dL Final   GFR, Estimated 02/17/2022 >60  >60 mL/min Final   Comment: (NOTE) Calculated using the CKD-EPI Creatinine Equation (2021)    Anion gap 02/17/2022 10  5 - 15 Final   Performed at [facility], [place] [address], [place], [place] [address]   WBC 02/17/2022 6.5  4.0 - 10.5 K/uL Final   RBC 02/17/2022 4.24  3.87 - 5.11 MIL/uL Final   Hemoglobin 02/17/2022 10.2 (L)  12.0 - 15.0 g/dL Final   HCT 02/17/2022 33.2 (L)  36.0 - 46.0 % Final   MCV  02/17/2022 78.3 (L)  80.0 - 100.0 fL Final   MCH 02/17/2022 24.1 (L)  26.0 - 34.0 pg Final   MCHC 02/17/2022 30.7  30.0 - 36.0 g/dL Final   RDW 02/17/2022 17.2 (H)  11.5 - 15.5 % Final   Platelets 02/17/2022 390  150 - 400 K/uL Final   nRBC 02/17/2022 0.0  0.0 - 0.2 % Final   Performed at [facility], [place] [address], [place], [place] [address]   Troponin I (High Sensitivity) 02/17/2022 4  <18 ng/L Final   Comment: (NOTE) Elevated high sensitivity troponin I (hsTnI) values and significant  changes across serial measurements may suggest ACS but many other  chronic and acute conditions are known to elevate hsTnI results.  Refer to the "Links" section for chest pain algorithms and additional  guidance. Performed at [facility], [place] [address] [place], [place] [address]    Valproic Acid Lvl 02/17/2022 17 (L)  50.0 - 100.0 ug/mL Final   Performed at [facility] [address] [place], [place] [address]   B Natriuretic Peptide 02/17/2022 30.9  0.0 - 100.0 pg/mL Final   Performed at [facility] [address], [place], [place] [address]   Troponin I (High Sensitivity) 02/18/2022  4  <18 ng/L Final   Comment: (NOTE) Elevated high sensitivity troponin I (hsTnI) values and significant  changes across serial measurements may suggest ACS but many other  chronic and acute conditions are known to elevate hsTnI results.  Refer to the "Links" section for chest pain algorithms and additional  guidance. Performed at [facility], [place] [address] [place], [place] [address]    D-Dimer, Quant 02/18/2022 0.29  0.00 - 0.50 ug/mL-FEU Final   Comment: (NOTE) At the manufacturer cut-off value of 0.5 g/mL FEU, this assay has a negative predictive value of 95-100%.This assay is intended for use in conjunction with a clinical pretest probability (PTP) assessment model to exclude pulmonary embolism (PE) and deep venous thrombosis (DVT) in outpatients suspected of PE or  DVT. Results should be correlated with clinical presentation. Performed at [facility], [place] [address] [place], [place] [address]   Admission on 02/09/2022, Discharged on 02/17/2022  Component Date Value Ref Range Status   Glucose-Capillary 02/09/2022 118 (H)  70 - 99 mg/dL Final   Glucose reference range applies only to samples taken after fasting for at least 8 hours.   Glucose-Capillary 02/10/2022 148 (H)  70 - 99 mg/dL Final   Glucose reference range applies only to samples taken after fasting for at least 8 hours.   Glucose-Capillary 02/10/2022 174 (H)  70 - 99 mg/dL Final   Glucose reference range applies only to samples taken after fasting for at least 8 hours.   Glucose-Capillary 02/10/2022 128 (H)  70 - 99 mg/dL Final   Glucose reference range applies only to samples taken after fasting for at least 8 hours.   Glucose-Capillary 02/10/2022 182 (H)  70 - 99 mg/dL Final   Glucose reference range applies only to samples taken after fasting for at least 8 hours.   Glucose-Capillary 02/11/2022 158 (H)  70 - 99 mg/dL Final   Glucose reference range applies only to samples taken after fasting for at least 8 hours.   Glucose-Capillary 02/11/2022 159 (H)  70 - 99 mg/dL Final   Glucose reference range applies only to samples taken after fasting for at least 8 hours.   Glucose-Capillary 02/11/2022 153 (H)  70 - 99 mg/dL Final   Glucose reference range applies only to samples taken after fasting for at least 8 hours.   Glucose-Capillary 02/11/2022 159 (H)  70 - 99 mg/dL Final   Glucose reference range applies only to samples taken after fasting for at least 8 hours.   Glucose-Capillary 02/11/2022 153 (H)  70 - 99 mg/dL Final   Glucose reference range applies only to samples taken after fasting for at least 8 hours.   Glucose-Capillary 02/11/2022 109 (H)  70 - 99 mg/dL Final   Glucose reference range applies only to samples taken after fasting for at least 8 hours.   Glucose-Capillary  02/11/2022 213 (H)  70 - 99 mg/dL Final   Glucose reference range applies only to samples taken after fasting for at least 8 hours.   Glucose-Capillary 02/11/2022 229 (H)  70 - 99 mg/dL Final   Glucose reference range applies only to samples taken after fasting for at least 8 hours.   Glucose-Capillary 02/12/2022 128 (H)  70 - 99 mg/dL Final   Glucose reference range applies only to samples taken after fasting for at least 8 hours.   Glucose-Capillary 02/12/2022 149 (H)  70 - 99 mg/dL Final   Glucose reference range applies only to samples taken after  fasting for at least 8 hours.   Color, Urine 02/12/2022 YELLOW  YELLOW Final   APPearance 02/12/2022 CLEAR  CLEAR Final   Specific Gravity, Urine 02/12/2022 1.015  1.005 - 1.030 Final   pH 02/12/2022 5.0  5.0 - 8.0 Final   Glucose, UA 02/12/2022 NEGATIVE  NEGATIVE mg/dL Final   Hgb urine dipstick 02/12/2022 NEGATIVE  NEGATIVE Final   Bilirubin Urine 02/12/2022 NEGATIVE  NEGATIVE Final   Ketones, ur 02/12/2022 NEGATIVE  NEGATIVE mg/dL Final   Protein, ur 02/12/2022 NEGATIVE  NEGATIVE mg/dL Final   Nitrite 02/12/2022 NEGATIVE  NEGATIVE Final   Leukocytes,Ua 02/12/2022 NEGATIVE  NEGATIVE Final   Performed at [facility], [place] [address], [place], [place] [address]   Specimen Description 02/12/2022 URINE, CLEAN CATCH   Final   Special Requests 02/12/2022    Final                   Value:NONE Performed at [facility], [place] [address] [place], [place] [address]    Culture 02/12/2022 10,000 COLONIES/mL PROTEUS MIRABILIS (A)   Final   Report Status 02/12/2022 02/15/2022 FINAL   Final   Organism ID, Bacteria 02/12/2022 PROTEUS MIRABILIS (A)   Final   Glucose-Capillary 02/12/2022 128 (H)  70 - 99 mg/dL Final   Glucose reference range applies only to samples taken after fasting for at least 8 hours.   Glucose-Capillary 02/12/2022 196 (H)  70 - 99 mg/dL Final   Glucose reference range applies only to samples taken after fasting for at  least 8 hours.   Glucose-Capillary 02/13/2022 168 (H)  70 - 99 mg/dL Final   Glucose reference range applies only to samples taken after fasting for at least 8 hours.   Glucose-Capillary 02/13/2022 153 (H)  70 - 99 mg/dL Final   Glucose reference range applies only to samples taken after fasting for at least 8 hours.   Glucose-Capillary 02/13/2022 134 (H)  70 - 99 mg/dL Final   Glucose reference range applies only to samples taken after fasting for at least 8 hours.   Glucose-Capillary 02/13/2022 178 (H)  70 - 99 mg/dL Final   Glucose reference range applies only to samples taken after fasting for at least 8 hours.   Glucose-Capillary 02/14/2022 156 (H)  70 - 99 mg/dL Final   Glucose reference range applies only to samples taken after fasting for at least 8 hours.   Glucose-Capillary 02/14/2022 169 (H)  70 - 99 mg/dL Final   Glucose reference range applies only to samples taken after fasting for at least 8 hours.   Glucose-Capillary 02/14/2022 156 (H)  70 - 99 mg/dL Final   Glucose reference range applies only to samples taken after fasting for at least 8 hours.   Glucose-Capillary 02/14/2022 148 (H)  70 - 99 mg/dL Final   Glucose reference range applies only to samples taken after fasting for at least 8 hours.   Glucose-Capillary 02/15/2022 159 (H)  70 - 99 mg/dL Final   Glucose reference range applies only to samples taken after fasting for at least 8 hours.   Glucose-Capillary 02/15/2022 125 (H)  70 - 99 mg/dL Final   Glucose reference range applies only to samples taken after fasting for at least 8 hours.   Glucose-Capillary 02/15/2022 142 (H)  70 - 99 mg/dL Final   Glucose reference range applies only to samples taken after fasting for at least 8 hours.   Glucose-Capillary 02/15/2022 128 (H)  70 - 99  mg/dL Final   Glucose reference range applies only to samples taken after fasting for at least 8 hours.   Glucose-Capillary 02/16/2022 137 (H)  70 - 99 mg/dL Final   Glucose reference  range applies only to samples taken after fasting for at least 8 hours.   WBC 02/16/2022 6.0  4.0 - 10.5 K/uL Final   RBC 02/16/2022 4.48  3.87 - 5.11 MIL/uL Final   Hemoglobin 02/16/2022 10.7 (L)  12.0 - 15.0 g/dL Final   HCT 02/16/2022 35.4 (L)  36.0 - 46.0 % Final   MCV 02/16/2022 79.0 (L)  80.0 - 100.0 fL Final   MCH 02/16/2022 23.9 (L)  26.0 - 34.0 pg Final   MCHC 02/16/2022 30.2  30.0 - 36.0 g/dL Final   RDW 02/16/2022 17.0 (H)  11.5 - 15.5 % Final   Platelets 02/16/2022 387  150 - 400 K/uL Final   nRBC 02/16/2022 0.0  0.0 - 0.2 % Final   Neutrophils Relative % 02/16/2022 54  % Final   Neutro Abs 02/16/2022 3.2  1.7 - 7.7 K/uL Final   Lymphocytes Relative 02/16/2022 35  % Final   Lymphs Abs 02/16/2022 2.1  0.7 - 4.0 K/uL Final   Monocytes Relative 02/16/2022 6  % Final   Monocytes Absolute 02/16/2022 0.4  0.1 - 1.0 K/uL Final   Eosinophils Relative 02/16/2022 2  % Final   Eosinophils Absolute 02/16/2022 0.1  0.0 - 0.5 K/uL Final   Basophils Relative 02/16/2022 2  % Final   Basophils Absolute 02/16/2022 0.1  0.0 - 0.1 K/uL Final   Immature Granulocytes 02/16/2022 1  % Final   Abs Immature Granulocytes 02/16/2022 0.04  0.00 - 0.07 K/uL Final   Performed at [facility], [place] [address], [place], [place] [address]   Glucose-Capillary 02/16/2022 131 (H)  70 - 99 mg/dL Final   Glucose reference range applies only to samples taken after fasting for at least 8 hours.   Glucose-Capillary 02/16/2022 123 (H)  70 - 99 mg/dL Final   Glucose reference range applies only to samples taken after fasting for at least 8 hours.   Glucose-Capillary 02/16/2022 195 (H)  70 - 99 mg/dL Final   Glucose reference range applies only to samples taken after fasting for at least 8 hours.   Glucose-Capillary 02/17/2022 148 (H)  70 - 99 mg/dL Final   Glucose reference range applies only to samples taken after fasting for at least 8 hours.  Admission on 02/08/2022, Discharged on 02/09/2022  Component Date  Value Ref Range Status   B Natriuretic Peptide 02/08/2022 40.2  0.0 - 100.0 pg/mL Final   Performed at [facility], [address] [place], [place] [address]   Troponin I (High Sensitivity) 02/08/2022 4  <18 ng/L Final   Comment: (NOTE) Elevated high sensitivity troponin I (hsTnI) values and significant  changes across serial measurements may suggest ACS but many other  chronic and acute conditions are known to elevate hsTnI results.  Refer to the "Links" section for chest pain algorithms and additional  guidance. Performed at [facility], [place] [address] [place], [place] [address]    WBC 02/08/2022 8.5  4.0 - 10.5 K/uL Final   RBC 02/08/2022 4.30  3.87 - 5.11 MIL/uL Final   Hemoglobin 02/08/2022 10.7 (L)  12.0 - 15.0 g/dL Final   HCT 02/08/2022 33.1 (L)  36.0 - 46.0 % Final   MCV 02/08/2022 77.0 (L)  80.0 - 100.0 fL Final  MCH 02/08/2022 24.9 (L)  26.0 - 34.0 pg Final   MCHC 02/08/2022 32.3  30.0 - 36.0 g/dL Final   RDW 02/08/2022 16.5 (H)  11.5 - 15.5 % Final   Platelets 02/08/2022 361  150 - 400 K/uL Final   nRBC 02/08/2022 0.0  0.0 - 0.2 % Final   Performed at [facility], [place] [address], [place], [place] [address]   Sodium 02/08/2022 131 (L)  135 - 145 mmol/L Final   Potassium 02/08/2022 4.4  3.5 - 5.1 mmol/L Final   Chloride 02/08/2022 96 (L)  98 - 111 mmol/L Final   CO2 02/08/2022 25  22 - 32 mmol/L Final   Glucose, Bld 02/08/2022 126 (H)  70 - 99 mg/dL Final   Glucose reference range applies only to samples taken after fasting for at least 8 hours.   BUN 02/08/2022 11  6 - 20 mg/dL Final   Creatinine, Ser 02/08/2022 0.52  0.44 - 1.00 mg/dL Final   Calcium 02/08/2022 9.4  8.9 - 10.3 mg/dL Final   Total Protein 02/08/2022 7.5  6.5 - 8.1 g/dL Final   Albumin 02/08/2022 3.6  3.5 - 5.0 g/dL Final   AST 02/08/2022 22  15 - 41 U/L Final   ALT 02/08/2022 20  0 - 44 U/L Final   Alkaline Phosphatase 02/08/2022 67  38 - 126 U/L Final   Total Bilirubin  02/08/2022 0.1 (L)  0.3 - 1.2 mg/dL Final   GFR, Estimated 02/08/2022 >60  >60 mL/min Final   Comment: (NOTE) Calculated using the CKD-EPI Creatinine Equation (2021)    Anion gap 02/08/2022 10  5 - 15 Final   Performed at [facility] [address], [place], [place] [address]   Troponin I (High Sensitivity) 02/08/2022 5  <18 ng/L Final   Comment: (NOTE) Elevated high sensitivity troponin I (hsTnI) values and significant  changes across serial measurements may suggest ACS but many other  chronic and acute conditions are known to elevate hsTnI results.  Refer to the "Links" section for chest pain algorithms and additional  guidance. Performed at [facility], [place] [address], [place], [place] [address]    WBC 02/09/2022 7.3  4.0 - 10.5 K/uL Final   RBC 02/09/2022 4.70  3.87 - 5.11 MIL/uL Final   Hemoglobin 02/09/2022 11.4 (L)  12.0 - 15.0 g/dL Final   HCT 02/09/2022 36.6  36.0 - 46.0 % Final   MCV 02/09/2022 77.9 (L)  80.0 - 100.0 fL Final   MCH 02/09/2022 24.3 (L)  26.0 - 34.0 pg Final   MCHC 02/09/2022 31.1  30.0 - 36.0 g/dL Final   RDW 02/09/2022 16.6 (H)  11.5 - 15.5 % Final   Platelets 02/09/2022 403 (H)  150 - 400 K/uL Final   nRBC 02/09/2022 0.0  0.0 - 0.2 % Final   Performed at [facility] [address], [place], [place] [address]   Glucose-Capillary 02/08/2022 117 (H)  70 - 99 mg/dL Final   Glucose reference range applies only to samples taken after fasting for at least 8 hours.   Sodium 02/09/2022 138  135 - 145 mmol/L Final   Potassium 02/09/2022 3.7  3.5 - 5.1 mmol/L Final   Chloride 02/09/2022 103  98 - 111 mmol/L Final   CO2 02/09/2022 24  22 - 32 mmol/L Final   Glucose, Bld 02/09/2022 134 (H)  70 - 99 mg/dL Final   Glucose reference range applies only to samples taken  after fasting for at least 8 hours.   BUN 02/09/2022 9  6 - 20 mg/dL Final   Creatinine, Ser 02/09/2022 0.44  0.44 - 1.00 mg/dL Final   Calcium 02/09/2022 8.3 (L)  8.9 - 10.3  mg/dL Final   GFR, Estimated 02/09/2022 >60  >60 mL/min Final   Comment: (NOTE) Calculated using the CKD-EPI Creatinine Equation (2021)    Anion gap 02/09/2022 11  5 - 15 Final   Performed at [facility], [place] [address], [place], [place] [address]   Iron 02/09/2022 20 (L)  28 - 170 ug/dL Final   TIBC 02/09/2022 455 (H)  250 - 450 ug/dL Final   Saturation Ratios 02/09/2022 4 (L)  10.4 - 31.8 % Final   UIBC 02/09/2022 435  ug/dL Final   Performed at [facility], [place] [address] [place], [place] [address]   Ferritin 02/09/2022 2 (L)  11 - 307 ng/mL Final   Performed at [facility] [address] [place], [place] [address]   Weight 02/09/2022 2,846.58  oz Final   Height 02/09/2022 62  in Final   BP 02/09/2022 143/80  mmHg Final   WBC 02/09/2022 8.1  4.0 - 10.5 K/uL Final   RBC 02/09/2022 4.38  3.87 - 5.11 MIL/uL Final   Hemoglobin 02/09/2022 10.9 (L)  12.0 - 15.0 g/dL Final   HCT 02/09/2022 33.3 (L)  36.0 - 46.0 % Final   MCV 02/09/2022 76.0 (L)  80.0 - 100.0 fL Final   MCH 02/09/2022 24.9 (L)  26.0 - 34.0 pg Final   MCHC 02/09/2022 32.7  30.0 - 36.0 g/dL Final   RDW 02/09/2022 16.5 (H)  11.5 - 15.5 % Final   Platelets 02/09/2022 397  150 - 400 K/uL Final   nRBC 02/09/2022 0.0  0.0 - 0.2 % Final   Neutrophils Relative % 02/09/2022 66  % Final   Neutro Abs 02/09/2022 5.4  1.7 - 7.7 K/uL Final   Lymphocytes Relative 02/09/2022 24  % Final   Lymphs Abs 02/09/2022 1.9  0.7 - 4.0 K/uL Final   Monocytes Relative 02/09/2022 8  % Final   Monocytes Absolute 02/09/2022 0.6  0.1 - 1.0 K/uL Final   Eosinophils Relative 02/09/2022 1  % Final   Eosinophils Absolute 02/09/2022 0.1  0.0 - 0.5 K/uL Final   Basophils Relative 02/09/2022 1  % Final   Basophils Absolute 02/09/2022 0.1  0.0 - 0.1 K/uL Final   Immature Granulocytes 02/09/2022 0  % Final   Abs Immature Granulocytes 02/09/2022 0.03  0.00 - 0.07 K/uL Final   Performed at [facility], [place] [address],  [place], [place] [address]   Glucose-Capillary 02/09/2022 238 (H)  70 - 99 mg/dL Final   Glucose reference range applies only to samples taken after fasting for at least 8 hours.   Glucose-Capillary 02/09/2022 91  70 - 99 mg/dL Final   Glucose reference range applies only to samples taken after fasting for at least 8 hours.   CRP 02/09/2022 <0.5  <1.0 mg/dL Final   Performed at [facility] [address] [place], [place] [address]   Sed Rate 02/09/2022 15  0 - 22 mm/hr Final   Performed at [facility] [address], [place], [place] [address]   Glucose-Capillary 02/09/2022 137 (H)  70 - 99 mg/dL Final   Glucose reference range applies only to samples taken after fasting for at least 8 hours.  Admission  on 01/24/2022, Discharged on 02/08/2022  Component Date Value Ref Range Status   Glucose-Capillary 01/24/2022 143 (H)  70 - 99 mg/dL Final   Glucose reference range applies only to samples taken after fasting for at least 8 hours.   Glucose-Capillary 01/24/2022 120 (H)  70 - 99 mg/dL Final   Glucose reference range applies only to samples taken after fasting for at least 8 hours.   Glucose-Capillary 01/25/2022 122 (H)  70 - 99 mg/dL Final   Glucose reference range applies only to samples taken after fasting for at least 8 hours.   Glucose-Capillary 01/25/2022 190 (H)  70 - 99 mg/dL Final   Glucose reference range applies only to samples taken after fasting for at least 8 hours.   Glucose-Capillary 01/25/2022 163 (H)  70 - 99 mg/dL Final   Glucose reference range applies only to samples taken after fasting for at least 8 hours.   WBC 01/26/2022 6.5  4.0 - 10.5 K/uL Final   RBC 01/26/2022 4.53  3.87 - 5.11 MIL/uL Final   Hemoglobin 01/26/2022 11.3 (L)  12.0 - 15.0 g/dL Final   HCT 01/26/2022 36.4  36.0 - 46.0 % Final   MCV 01/26/2022 80.4  80.0 - 100.0 fL Final   MCH 01/26/2022 24.9 (L)  26.0 - 34.0 pg Final   MCHC 01/26/2022 31.0  30.0 - 36.0 g/dL Final   RDW 01/26/2022 17.3  (H)  11.5 - 15.5 % Final   Platelets 01/26/2022 327  150 - 400 K/uL Final   nRBC 01/26/2022 0.0  0.0 - 0.2 % Final   Neutrophils Relative % 01/26/2022 60  % Final   Neutro Abs 01/26/2022 3.9  1.7 - 7.7 K/uL Final   Lymphocytes Relative 01/26/2022 31  % Final   Lymphs Abs 01/26/2022 2.0  0.7 - 4.0 K/uL Final   Monocytes Relative 01/26/2022 7  % Final   Monocytes Absolute 01/26/2022 0.5  0.1 - 1.0 K/uL Final   Eosinophils Relative 01/26/2022 1  % Final   Eosinophils Absolute 01/26/2022 0.1  0.0 - 0.5 K/uL Final   Basophils Relative 01/26/2022 1  % Final   Basophils Absolute 01/26/2022 0.1  0.0 - 0.1 K/uL Final   Immature Granulocytes 01/26/2022 0  % Final   Abs Immature Granulocytes 01/26/2022 0.02  0.00 - 0.07 K/uL Final   Performed at [facility], [place] [address] [place], [place] [address]   Glucose-Capillary 01/26/2022 149 (H)  70 - 99 mg/dL Final   Glucose reference range applies only to samples taken after fasting for at least 8 hours.   Glucose-Capillary 01/26/2022 130 (H)  70 - 99 mg/dL Final   Glucose reference range applies only to samples taken after fasting for at least 8 hours.   Glucose-Capillary 01/26/2022 151 (H)  70 - 99 mg/dL Final   Glucose reference range applies only to samples taken after fasting for at least 8 hours.   Glucose-Capillary 01/26/2022 170 (H)  70 - 99 mg/dL Final   Glucose reference range applies only to samples taken after fasting for at least 8 hours.   Glucose-Capillary 01/27/2022 129 (H)  70 - 99 mg/dL Final   Glucose reference range applies only to samples taken after fasting for at least 8 hours.   Glucose-Capillary 01/27/2022 101 (H)  70 - 99 mg/dL Final   Glucose reference range applies only to samples taken after fasting for at least 8 hours.   Glucose-Capillary 01/27/2022 220 (H)  70 - 99 mg/dL Final  Glucose reference range applies only to samples taken after fasting for at least 8 hours.   Glucose-Capillary 01/27/2022 154 (H)  70 - 99  mg/dL Final   Glucose reference range applies only to samples taken after fasting for at least 8 hours.   Glucose-Capillary 01/27/2022 163 (H)  70 - 99 mg/dL Final   Glucose reference range applies only to samples taken after fasting for at least 8 hours.   Glucose-Capillary 01/28/2022 127 (H)  70 - 99 mg/dL Final   Glucose reference range applies only to samples taken after fasting for at least 8 hours.   Glucose-Capillary 01/28/2022 110 (H)  70 - 99 mg/dL Final   Glucose reference range applies only to samples taken after fasting for at least 8 hours.   Glucose-Capillary 01/28/2022 167 (H)  70 - 99 mg/dL Final   Glucose reference range applies only to samples taken after fasting for at least 8 hours.   Glucose-Capillary 01/29/2022 145 (H)  70 - 99 mg/dL Final   Glucose reference range applies only to samples taken after fasting for at least 8 hours.   Glucose-Capillary 01/29/2022 203 (H)  70 - 99 mg/dL Final   Glucose reference range applies only to samples taken after fasting for at least 8 hours.   Glucose-Capillary 01/29/2022 153 (H)  70 - 99 mg/dL Final   Glucose reference range applies only to samples taken after fasting for at least 8 hours.   Glucose-Capillary 01/29/2022 166 (H)  70 - 99 mg/dL Final   Glucose reference range applies only to samples taken after fasting for at least 8 hours.   Glucose-Capillary 01/30/2022 142 (H)  70 - 99 mg/dL Final   Glucose reference range applies only to samples taken after fasting for at least 8 hours.   Glucose-Capillary 01/30/2022 138 (H)  70 - 99 mg/dL Final   Glucose reference range applies only to samples taken after fasting for at least 8 hours.   Glucose-Capillary 01/30/2022 185 (H)  70 - 99 mg/dL Final   Glucose reference range applies only to samples taken after fasting for at least 8 hours.   Glucose-Capillary 01/30/2022 220 (H)  70 - 99 mg/dL Final   Glucose reference range applies only to samples taken after fasting for at least 8  hours.   Glucose-Capillary 01/31/2022 147 (H)  70 - 99 mg/dL Final   Glucose reference range applies only to samples taken after fasting for at least 8 hours.   Glucose-Capillary 01/31/2022 131 (H)  70 - 99 mg/dL Final   Glucose reference range applies only to samples taken after fasting for at least 8 hours.   Glucose-Capillary 01/31/2022 169 (H)  70 - 99 mg/dL Final   Glucose reference range applies only to samples taken after fasting for at least 8 hours.   Glucose-Capillary 01/31/2022 144 (H)  70 - 99 mg/dL Final   Glucose reference range applies only to samples taken after fasting for at least 8 hours.   Comment 1 01/31/2022 RN AWARE   Final   Glucose-Capillary 02/01/2022 117 (H)  70 - 99 mg/dL Final   Glucose reference range applies only to samples taken after fasting for at least 8 hours.   Glucose-Capillary 02/01/2022 180 (H)  70 - 99 mg/dL Final   Glucose reference range applies only to samples taken after fasting for at least 8 hours.   Glucose-Capillary 02/01/2022 209 (H)  70 - 99 mg/dL Final   Glucose reference range applies only to samples taken  after fasting for at least 8 hours.   Glucose-Capillary 02/02/2022 131 (H)  70 - 99 mg/dL Final   Glucose reference range applies only to samples taken after fasting for at least 8 hours.   WBC 02/02/2022 7.5  4.0 - 10.5 K/uL Final   RBC 02/02/2022 4.52  3.87 - 5.11 MIL/uL Final   Hemoglobin 02/02/2022 11.3 (L)  12.0 - 15.0 g/dL Final   HCT 02/02/2022 35.0 (L)  36.0 - 46.0 % Final   MCV 02/02/2022 77.4 (L)  80.0 - 100.0 fL Final   MCH 02/02/2022 25.0 (L)  26.0 - 34.0 pg Final   MCHC 02/02/2022 32.3  30.0 - 36.0 g/dL Final   RDW 02/02/2022 16.7 (H)  11.5 - 15.5 % Final   Platelets 02/02/2022 345  150 - 400 K/uL Final   nRBC 02/02/2022 0.0  0.0 - 0.2 % Final   Neutrophils Relative % 02/02/2022 63  % Final   Neutro Abs 02/02/2022 4.7  1.7 - 7.7 K/uL Final   Lymphocytes Relative 02/02/2022 28  % Final   Lymphs Abs 02/02/2022 2.1  0.7 -  4.0 K/uL Final   Monocytes Relative 02/02/2022 7  % Final   Monocytes Absolute 02/02/2022 0.5  0.1 - 1.0 K/uL Final   Eosinophils Relative 02/02/2022 1  % Final   Eosinophils Absolute 02/02/2022 0.1  0.0 - 0.5 K/uL Final   Basophils Relative 02/02/2022 1  % Final   Basophils Absolute 02/02/2022 0.1  0.0 - 0.1 K/uL Final   Immature Granulocytes 02/02/2022 0  % Final   Abs Immature Granulocytes 02/02/2022 0.03  0.00 - 0.07 K/uL Final   Performed at [facility], [place] [address], [place],  [address]   Glucose-Capillary 02/02/2022 95  70 - 99 mg/dL Final   Glucose reference range applies only to samples taken after fasting for at least 8 hours.   Glucose-Capillary 02/02/2022 120 (H)  70 - 99 mg/dL Final   Glucose reference range applies only to samples taken after fasting for at least 8 hours.   Glucose-Capillary 02/02/2022 191 (H)  70 - 99 mg/dL Final   Glucose reference range applies only to samples taken after fasting for at least 8 hours.   Glucose-Capillary 02/03/2022 148 (H)  70 - 99 mg/dL Final   Glucose reference range applies only to samples taken after fasting for at least 8 hours.   Glucose-Capillary 02/03/2022 122 (H)  70 - 99 mg/dL Final   Glucose reference range applies only to samples taken after fasting for at least 8 hours.   Glucose-Capillary 02/03/2022 233 (H)  70 - 99 mg/dL Final   Glucose reference range applies only to samples taken after fasting for at least 8 hours.   Glucose-Capillary 02/04/2022 126 (H)  70 - 99 mg/dL Final   Glucose reference range applies only to samples taken after fasting for at least 8 hours.   Glucose-Capillary 02/04/2022 117 (H)  70 - 99 mg/dL Final   Glucose reference range applies only to samples taken after fasting for at least 8 hours.   Glucose-Capillary 02/04/2022 135 (H)  70 - 99 mg/dL Final   Glucose reference range applies only to samples taken after fasting for at least 8 hours.   Glucose-Capillary 02/04/2022 197 (H)  70 -  99 mg/dL Final   Glucose reference range applies only to samples taken after fasting for at least 8 hours.   Glucose-Capillary 02/05/2022 170 (H)  70 - 99 mg/dL Final   Glucose  reference range applies only to samples taken after fasting for at least 8 hours.   Glucose-Capillary 02/05/2022 107 (H)  70 - 99 mg/dL Final   Glucose reference range applies only to samples taken after fasting for at least 8 hours.   Glucose-Capillary 02/05/2022 184 (H)  70 - 99 mg/dL Final   Glucose reference range applies only to samples taken after fasting for at least 8 hours.   Glucose-Capillary 02/05/2022 256 (H)  70 - 99 mg/dL Final   Glucose reference range applies only to samples taken after fasting for at least 8 hours.   Glucose-Capillary 02/06/2022 144 (H)  70 - 99 mg/dL Final   Glucose reference range applies only to samples taken after fasting for at least 8 hours.   Glucose-Capillary 02/06/2022 190 (H)  70 - 99 mg/dL Final   Glucose reference range applies only to samples taken after fasting for at least 8 hours.   Glucose-Capillary 02/06/2022 92  70 - 99 mg/dL Final   Glucose reference range applies only to samples taken after fasting for at least 8 hours.   Trichomonas 02/06/2022 Negative   Final   Bacterial Vaginitis-Urine 02/06/2022 Negative   Final   Molecular Comment 02/06/2022 For tests bacteria and/or candida, this specimen does not meet the   Final   Molecular Comment 02/06/2022 strict criteria set by the FDA. The result interpretation should be   Final   Molecular Comment 02/06/2022 considered in conjunction with the patient's clinical history.   Final   Comment 02/06/2022 Normal Reference Range Trichomonas - Negative   Final   Glucose-Capillary 02/06/2022 197 (H)  70 - 99 mg/dL Final   Glucose reference range applies only to samples taken after fasting for at least 8 hours.   Glucose-Capillary 02/07/2022 129 (H)  70 - 99 mg/dL Final   Glucose reference range applies only to samples taken  after fasting for at least 8 hours.   Glucose-Capillary 02/07/2022 207 (H)  70 - 99 mg/dL Final   Glucose reference range applies only to samples taken after fasting for at least 8 hours.   Glucose-Capillary 02/07/2022 153 (H)  70 - 99 mg/dL Final   Glucose reference range applies only to samples taken after fasting for at least 8 hours.   Glucose-Capillary 02/08/2022 129 (H)  70 - 99 mg/dL Final   Glucose reference range applies only to samples taken after fasting for at least 8 hours.   Glucose-Capillary 02/08/2022 107 (H)  70 - 99 mg/dL Final   Glucose reference range applies only to samples taken after fasting for at least 8 hours.  Admission on 01/23/2022, Discharged on 01/24/2022  Component Date Value Ref Range Status   Sodium 01/23/2022 140  135 - 145 mmol/L Final   Potassium 01/23/2022 4.4  3.5 - 5.1 mmol/L Final   Chloride 01/23/2022 101  98 - 111 mmol/L Final   CO2 01/23/2022 25  22 - 32 mmol/L Final   Glucose, Bld 01/23/2022 198 (H)  70 - 99 mg/dL Final   Glucose reference range applies only to samples taken after fasting for at least 8 hours.   BUN 01/23/2022 13  6 - 20 mg/dL Final   Creatinine, Ser 01/23/2022 0.62  0.44 - 1.00 mg/dL Final   Calcium 01/23/2022 9.3  8.9 - 10.3 mg/dL Final   GFR, Estimated 01/23/2022 >60  >60 mL/min Final   Comment: (NOTE) Calculated using the CKD-EPI Creatinine Equation (2021)    Anion gap 01/23/2022 14  5 - 15  Final   Performed at [facility], [place] [address], [place], [place] [address]   WBC 01/23/2022 5.8  4.0 - 10.5 K/uL Final   RBC 01/23/2022 4.63  3.87 - 5.11 MIL/uL Final   Hemoglobin 01/23/2022 11.7 (L)  12.0 - 15.0 g/dL Final   HCT 01/23/2022 37.4  36.0 - 46.0 % Final   MCV 01/23/2022 80.8  80.0 - 100.0 fL Final   MCH 01/23/2022 25.3 (L)  26.0 - 34.0 pg Final   MCHC 01/23/2022 31.3  30.0 - 36.0 g/dL Final   RDW 01/23/2022 17.9 (H)  11.5 - 15.5 % Final   Platelets 01/23/2022 333  150 - 400 K/uL Final   nRBC 01/23/2022 0.0   0.0 - 0.2 % Final   Performed at [facility] [address], [place], [place] [address]   Troponin I (High Sensitivity) 01/23/2022 6  <18 ng/L Final   Comment: (NOTE) Elevated high sensitivity troponin I (hsTnI) values and significant  changes across serial measurements may suggest ACS but many other  chronic and acute conditions are known to elevate hsTnI results.  Refer to the "Links" section for chest pain algorithms and additional  guidance. Performed at [facility], [place] [address], [place], [place] [address]    Troponin I (High Sensitivity) 01/23/2022 5  <18 ng/L Final   Comment: (NOTE) Elevated high sensitivity troponin I (hsTnI) values and significant  changes across serial measurements may suggest ACS but many other  chronic and acute conditions are known to elevate hsTnI results.  Refer to the "Links" section for chest pain algorithms and additional  guidance. Performed at [facility], [place] [address], [place], [place] [address]    SARS Coronavirus 2 by RT PCR 01/23/2022 NEGATIVE  NEGATIVE Final   Comment: (NOTE) SARS-CoV-2 target nucleic acids are NOT DETECTED.  The SARS-CoV-2 RNA is generally detectable in upper and lower respiratory specimens during the acute phase of infection. The lowest concentration of SARS-CoV-2 viral copies this assay can detect is 250 copies / mL. A negative result does not preclude SARS-CoV-2 infection and should not be used as the sole basis for treatment or other patient management decisions.  A negative result may occur with improper specimen collection / handling, submission of specimen other than nasopharyngeal swab, presence of viral mutation(s) within the areas targeted by this assay, and inadequate number of viral copies (<250 copies / mL). A negative result must be combined with clinical observations, patient history, and epidemiological information.  Fact Sheet for Patients:    https://www.patel.info/  Fact Sheet for Healthcare Providers: https://hall.com/  This test is not yet approved or                           cleared by the [place] FDA and has been authorized for detection and/or diagnosis of SARS-CoV-2 by FDA under an Emergency Use Authorization (EUA).  This EUA will remain in effect (meaning this test can be used) for the duration of the COVID-19 declaration under Section 564(b)(1) of the Act, 21 U.S.C. section 360bbb-3(b)(1), unless the authorization is terminated or revoked sooner.  Performed at [facility], [place] [address] [place], [place] [address]    B Natriuretic Peptide 01/23/2022 41.3  0.0 - 100.0 pg/mL Final   Performed at [facility] [address], [place], [place] [address]   Group A Strep by PCR 01/23/2022 NOT DETECTED  NOT DETECTED  Final   Performed at [facility], [place] [address] [place], [place] [address]   Color, Urine 01/23/2022 YELLOW  YELLOW Final   APPearance 01/23/2022 CLEAR  CLEAR Final   Specific Gravity, Urine 01/23/2022 1.018  1.005 - 1.030 Final   pH 01/23/2022 7.0  5.0 - 8.0 Final   Glucose, UA 01/23/2022 NEGATIVE  NEGATIVE mg/dL Final   Hgb urine dipstick 01/23/2022 NEGATIVE  NEGATIVE Final   Bilirubin Urine 01/23/2022 NEGATIVE  NEGATIVE Final   Ketones, ur 01/23/2022 NEGATIVE  NEGATIVE mg/dL Final   Protein, ur 01/23/2022 NEGATIVE  NEGATIVE mg/dL Final   Nitrite 01/23/2022 NEGATIVE  NEGATIVE Final   Leukocytes,Ua 01/23/2022 TRACE (A)  NEGATIVE Final   RBC / HPF 01/23/2022 0-5  0 - 5 RBC/hpf Final   WBC, UA 01/23/2022 0-5  0 - 5 WBC/hpf Final   Bacteria, UA 01/23/2022 NONE SEEN  NONE SEEN Final   Squamous Epithelial / LPF 01/23/2022 0-5  0 - 5 Final   Mucus 01/23/2022 PRESENT   Final   Performed at [facility], [place] [address], [place], [place] [address]   SARS Coronavirus 2 by RT PCR 01/23/2022 NEGATIVE  NEGATIVE Final   Comment:  (NOTE) SARS-CoV-2 target nucleic acids are NOT DETECTED.  The SARS-CoV-2 RNA is generally detectable in upper respiratory specimens during the acute phase of infection. The lowest concentration of SARS-CoV-2 viral copies this assay can detect is 138 copies/mL. A negative result does not preclude SARS-Cov-2 infection and should not be used as the sole basis for treatment or other patient management decisions. A negative result may occur with  improper specimen collection/handling, submission of specimen other than nasopharyngeal swab, presence of viral mutation(s) within the areas targeted by this assay, and inadequate number of viral copies(<138 copies/mL). A negative result must be combined with clinical observations, patient history, and epidemiological information. The expected result is Negative.  Fact Sheet for Patients:  EntrepreneurPulse.com.au  Fact Sheet for Healthcare Providers:  IncredibleEmployment.be  This test is no                          t yet approved or cleared by the [place] FDA and  has been authorized for detection and/or diagnosis of SARS-CoV-2 by FDA under an Emergency Use Authorization (EUA). This EUA will remain  in effect (meaning this test can be used) for the duration of the COVID-19 declaration under Section 564(b)(1) of the Act, 21 U.S.C.section 360bbb-3(b)(1), unless the authorization is terminated  or revoked sooner.       Influenza A by PCR 01/23/2022 NEGATIVE  NEGATIVE Final   Influenza B by PCR 01/23/2022 NEGATIVE  NEGATIVE Final   Comment: (NOTE) The Xpert Xpress SARS-CoV-2/FLU/RSV plus assay is intended as an aid in the diagnosis of influenza from Nasopharyngeal swab specimens and should not be used as a sole basis for treatment. Nasal washings and aspirates are unacceptable for Xpert Xpress SARS-CoV-2/FLU/RSV testing.  Fact Sheet for Patients: EntrepreneurPulse.com.au  Fact  Sheet for Healthcare Providers: IncredibleEmployment.be  This test is not yet approved or cleared by the [place] FDA and has been authorized for detection and/or diagnosis of SARS-CoV-2 by FDA under an Emergency Use Authorization (EUA). This EUA will remain in effect (meaning this test can be used) for the duration of the COVID-19 declaration under Section 564(b)(1) of the Act, 21 U.S.C. section 360bbb-3(b)(1), unless the authorization is terminated or revoked.  Performed at [facility],  [address], [place], [place] [address]    WBC 01/24/2022 6.1  4.0 - 10.5 K/uL Final   RBC 01/24/2022 4.60  3.87 - 5.11 MIL/uL Final   Hemoglobin 01/24/2022 11.7 (L)  12.0 - 15.0 g/dL Final   HCT 01/24/2022 37.0  36.0 - 46.0 % Final   MCV 01/24/2022 80.4  80.0 - 100.0 fL Final   MCH 01/24/2022 25.4 (L)  26.0 - 34.0 pg Final   MCHC 01/24/2022 31.6  30.0 - 36.0 g/dL Final   RDW 01/24/2022 17.6 (H)  11.5 - 15.5 % Final   Platelets 01/24/2022 334  150 - 400 K/uL Final   nRBC 01/24/2022 0.0  0.0 - 0.2 % Final   Neutrophils Relative % 01/24/2022 53  % Final   Neutro Abs 01/24/2022 3.3  1.7 - 7.7 K/uL Final   Lymphocytes Relative 01/24/2022 33  % Final   Lymphs Abs 01/24/2022 2.0  0.7 - 4.0 K/uL Final   Monocytes Relative 01/24/2022 11  % Final   Monocytes Absolute 01/24/2022 0.7  0.1 - 1.0 K/uL Final   Eosinophils Relative 01/24/2022 2  % Final   Eosinophils Absolute 01/24/2022 0.1  0.0 - 0.5 K/uL Final   Basophils Relative 01/24/2022 1  % Final   Basophils Absolute 01/24/2022 0.1  0.0 - 0.1 K/uL Final   Immature Granulocytes 01/24/2022 0  % Final   Abs Immature Granulocytes 01/24/2022 0.02  0.00 - 0.07 K/uL Final   Performed at [facility], [place] [address] [place], [place] [address]   Glucose-Capillary 01/24/2022 110 (H)  70 - 99 mg/dL Final   Glucose reference range applies only to samples taken after fasting for at least 8 hours.  No results displayed  because visit has over 200 results.    Admission on 11/22/2021, Discharged on 11/22/2021  Component Date Value Ref Range Status   Glucose-Capillary 11/22/2021 169 (H)  70 - 99 mg/dL Final   Glucose reference range applies only to samples taken after fasting for at least 8 hours.  No results displayed because visit has over 200 results.    Admission on 10/04/2021, Discharged on 10/22/2021  Component Date Value Ref Range Status   SARS Coronavirus 2 by RT PCR 10/04/2021 NEGATIVE  NEGATIVE Final   Comment: (NOTE) SARS-CoV-2 target nucleic acids are NOT DETECTED.  The SARS-CoV-2 RNA is generally detectable in upper respiratory specimens during the acute phase of infection. The lowest concentration of SARS-CoV-2 viral copies this assay can detect is 138 copies/mL. A negative result does not preclude SARS-Cov-2 infection and should not be used as the sole basis for treatment or other patient management decisions. A negative result may occur with  improper specimen collection/handling, submission of specimen other than nasopharyngeal swab, presence of viral mutation(s) within the areas targeted by this assay, and inadequate number of viral copies(<138 copies/mL). A negative result must be combined with clinical observations, patient history, and epidemiological information. The expected result is Negative.  Fact Sheet for Patients:  EntrepreneurPulse.com.au  Fact Sheet for Healthcare Providers:  IncredibleEmployment.be  This test is no                          t yet approved or cleared by the [place] FDA and  has been authorized for detection and/or diagnosis of SARS-CoV-2 by FDA under an Emergency Use Authorization (EUA). This EUA will remain  in effect (meaning this test can be used) for the duration of the  COVID-19 declaration under Section 564(b)(1) of the Act, 21 U.S.C.section 360bbb-3(b)(1), unless the authorization is terminated  or  revoked sooner.       Influenza A by PCR 10/04/2021 NEGATIVE  NEGATIVE Final   Influenza B by PCR 10/04/2021 NEGATIVE  NEGATIVE Final   Comment: (NOTE) The Xpert Xpress SARS-CoV-2/FLU/RSV plus assay is intended as an aid in the diagnosis of influenza from Nasopharyngeal swab specimens and should not be used as a sole basis for treatment. Nasal washings and aspirates are unacceptable for Xpert Xpress SARS-CoV-2/FLU/RSV testing.  Fact Sheet for Patients: EntrepreneurPulse.com.au  Fact Sheet for Healthcare Providers: IncredibleEmployment.be  This test is not yet approved or cleared by the [place] FDA and has been authorized for detection and/or diagnosis of SARS-CoV-2 by FDA under an Emergency Use Authorization (EUA). This EUA will remain in effect (meaning this test can be used) for the duration of the COVID-19 declaration under Section 564(b)(1) of the Act, 21 U.S.C. section 360bbb-3(b)(1), unless the authorization is terminated or revoked.  Performed at [facility], [place] [address], [place], [place] [address]    WBC 10/04/2021 8.4  4.0 - 10.5 K/uL Final   RBC 10/04/2021 4.42  3.87 - 5.11 MIL/uL Final   Hemoglobin 10/04/2021 11.6 (L)  12.0 - 15.0 g/dL Final   HCT 10/04/2021 35.7 (L)  36.0 - 46.0 % Final   MCV 10/04/2021 80.8  80.0 - 100.0 fL Final   MCH 10/04/2021 26.2  26.0 - 34.0 pg Final   MCHC 10/04/2021 32.5  30.0 - 36.0 g/dL Final   RDW 10/04/2021 16.0 (H)  11.5 - 15.5 % Final   Platelets 10/04/2021 372  150 - 400 K/uL Final   nRBC 10/04/2021 0.0  0.0 - 0.2 % Final   Neutrophils Relative % 10/04/2021 68  % Final   Neutro Abs 10/04/2021 5.7  1.7 - 7.7 K/uL Final   Lymphocytes Relative 10/04/2021 22  % Final   Lymphs Abs 10/04/2021 1.8  0.7 - 4.0 K/uL Final   Monocytes Relative 10/04/2021 8  % Final   Monocytes Absolute 10/04/2021 0.7  0.1 - 1.0 K/uL Final   Eosinophils Relative 10/04/2021 1  % Final   Eosinophils  Absolute 10/04/2021 0.1  0.0 - 0.5 K/uL Final   Basophils Relative 10/04/2021 1  % Final   Basophils Absolute 10/04/2021 0.1  0.0 - 0.1 K/uL Final   Immature Granulocytes 10/04/2021 0  % Final   Abs Immature Granulocytes 10/04/2021 0.03  0.00 - 0.07 K/uL Final   Performed at [facility], [place] [address], [place], [place] [address]   Sodium 10/04/2021 136  135 - 145 mmol/L Final   Potassium 10/04/2021 4.2  3.5 - 5.1 mmol/L Final   Chloride 10/04/2021 104  98 - 111 mmol/L Final   CO2 10/04/2021 25  22 - 32 mmol/L Final   Glucose, Bld 10/04/2021 100 (H)  70 - 99 mg/dL Final   Glucose reference range applies only to samples taken after fasting for at least 8 hours.   BUN 10/04/2021 9  6 - 20 mg/dL Final   Creatinine, Ser 10/04/2021 0.52  0.44 - 1.00 mg/dL Final   Calcium 10/04/2021 9.0  8.9 - 10.3 mg/dL Final   Total Protein 10/04/2021 7.0  6.5 - 8.1 g/dL Final   Albumin 10/04/2021 3.2 (L)  3.5 - 5.0 g/dL Final   AST 10/04/2021 13 (L)  15 - 41 U/L Final   ALT 10/04/2021 10  0 - 44 U/L  Final   Alkaline Phosphatase 10/04/2021 61  38 - 126 U/L Final   Total Bilirubin 10/04/2021 0.3  0.3 - 1.2 mg/dL Final   GFR, Estimated 10/04/2021 >60  >60 mL/min Final   Comment: (NOTE) Calculated using the CKD-EPI Creatinine Equation (2021)    Anion gap 10/04/2021 7  5 - 15 Final   Performed at [facility] [address] [place], [place] [address]   Hgb A1c MFr Bld 10/04/2021 7.0 (H)  4.8 - 5.6 % Final   Comment: (NOTE) Pre diabetes:          5.7%-6.4%  Diabetes:              >6.4%  Glycemic control for   <7.0% adults with diabetes    Mean Plasma Glucose 10/04/2021 154.2  mg/dL Final   Performed at [facility], [place] [address], [place], [place] [address]   Cholesterol 10/04/2021 178  0 - 200 mg/dL Final   Triglycerides 10/04/2021 155 (H)  <150 mg/dL Final   HDL 10/04/2021 52  >40 mg/dL Final   Total CHOL/HDL Ratio 10/04/2021 3.4  RATIO Final   VLDL 10/04/2021 31  0 - 40 mg/dL  Final   LDL Cholesterol 10/04/2021 95  0 - 99 mg/dL Final   Comment:        Total Cholesterol/HDL:CHD Risk Coronary Heart Disease Risk Table                     Men   Women  1/2 Average Risk   3.4   3.3  Average Risk       5.0   4.4  2 X Average Risk   9.6   7.1  3 X Average Risk  23.4   11.0        Use the calculated Patient Ratio above and the CHD Risk Table to determine the patient's CHD Risk.        ATP III CLASSIFICATION (LDL):  <100     mg/dL   Optimal  100-129  mg/dL   Near or Above                    Optimal  130-159  mg/dL   Borderline  160-189  mg/dL   High  >190     mg/dL   Very High Performed at [facility] [address], [place], [place] [address]    POC Amphetamine UR 10/04/2021 None Detected  NONE DETECTED (Cut Off Level 1000 ng/mL) Final   POC Secobarbital (BAR) 10/04/2021 None Detected  NONE DETECTED (Cut Off Level 300 ng/mL) Final   POC Buprenorphine (BUP) 10/04/2021 None Detected  NONE DETECTED (Cut Off Level 10 ng/mL) Final   POC Oxazepam (BZO) 10/04/2021 None Detected  NONE DETECTED (Cut Off Level 300 ng/mL) Final   POC Cocaine UR 10/04/2021 None Detected  NONE DETECTED (Cut Off Level 300 ng/mL) Final   POC Methamphetamine UR 10/04/2021 None Detected  NONE DETECTED (Cut Off Level 1000 ng/mL) Final   POC Morphine 10/04/2021 None Detected  NONE DETECTED (Cut Off Level 300 ng/mL) Final   POC Methadone UR 10/04/2021 None Detected  NONE DETECTED (Cut Off Level 300 ng/mL) Final   POC Oxycodone UR 10/04/2021 Positive (A)  NONE DETECTED (Cut Off Level 100 ng/mL) Final   POC Marijuana UR 10/04/2021 None Detected  NONE DETECTED (Cut Off Level 50 ng/mL) Final   SARSCOV2ONAVIRUS 2 AG 10/04/2021 NEGATIVE  NEGATIVE Final  Comment: (NOTE) SARS-CoV-2 antigen NOT DETECTED.   Negative results are presumptive.  Negative results do not preclude SARS-CoV-2 infection and should not be used as the sole basis for treatment or other patient management decisions,  including infection  control decisions, particularly in the presence of clinical signs and  symptoms consistent with COVID-19, or in those who have been in contact with the virus.  Negative results must be combined with clinical observations, patient history, and epidemiological information. The expected result is Negative.  Fact Sheet for Patients: HandmadeRecipes.com.cy  Fact Sheet for Healthcare Providers: FuneralLife.at  This test is not yet approved or cleared by the [place] FDA and  has been authorized for detection and/or diagnosis of SARS-CoV-2 by FDA under an Emergency Use Authorization (EUA).  This EUA will remain in effect (meaning this test can be used) for the duration of  the COV                          ID-19 declaration under Section 564(b)(1) of the Act, 21 U.S.C. section 360bbb-3(b)(1), unless the authorization is terminated or revoked sooner.     Valproic Acid Lvl 10/04/2021 51  50.0 - 100.0 ug/mL Final   Performed at [facility] [address] [place], [place] [address]   Valproic Acid Lvl 10/08/2021 57  50.0 - 100.0 ug/mL Final   Performed at [facility] [address] [place],  [address]   Glucose-Capillary 10/09/2021 123 (H)  70 - 99 mg/dL Final   Glucose reference range applies only to samples taken after fasting for at least 8 hours.  There may be more visits with results that are not included.    Blood Alcohol level:  Lab Results  Component Value Date   ETH <10 11/23/2021   ETH <10 51/70/0174    Metabolic Disorder Labs: Lab Results  Component Value Date   HGBA1C 6.7 (H) 12/28/2021   MPG 145.59 12/28/2021   MPG 154.2 10/04/2021   Lab Results  Component Value Date   PROLACTIN 3.8 (L) 11/23/2021   Lab Results  Component Value Date   CHOL 171 11/23/2021   TRIG 125 11/23/2021   HDL 65 11/23/2021   CHOLHDL 2.6 11/23/2021   VLDL 25 11/23/2021   LDLCALC 81 11/23/2021    LDLCALC 95 10/04/2021    Therapeutic Lab Levels: No results found for: "LITHIUM" Lab Results  Component Value Date   VALPROATE 17 (L) 02/17/2022   VALPROATE 33 (L) 01/18/2022   No results found for: "CBMZ"  Physical Findings   PHQ2-9    Flowsheet Row ED from 11/23/2021 in [facility]  PHQ-2 Total Score 2  PHQ-9 Total Score 3      Flowsheet Row ED from 02/18/2022 in [facility] ED from 02/17/2022 in [facility] from 02/09/2022 in [facility] No Risk No Risk No Risk       Psychiatric Specialty Exam  Presentation  General Appearance:  Appropriate for Environment; Casual; Fairly Groomed  Eye Contact: Good  Speech: Clear and Coherent; Normal Rate  Speech Volume: Normal  Handedness: Right   Mood and Affect  Mood: Euthymic  Affect: Appropriate; Congruent; Constricted   Thought Process  Thought Processes: Coherent; Goal Directed; Linear  Descriptions of Associations:Intact  Orientation:Full (Time, Place and Person)  Thought Content:Logical; WDL  Diagnosis of Schizophrenia or Schizoaffective disorder in  past: Yes  Duration of Psychotic Symptoms: NA  Hallucinations:Hallucinations: None  Ideas of Reference:None  Suicidal Thoughts:Suicidal Thoughts: No  Homicidal Thoughts:Homicidal Thoughts: No   Sensorium  Memory: Immediate Good  Judgment: Fair  Insight: Fair   Executive Functions  Concentration: Good  Attention Span: Good  Recall: Good  Fund of Knowledge: Good  Language: Good   Psychomotor Activity  Psychomotor Activity: Psychomotor Activity: Normal   Assets  Assets: Communication Skills; Desire for Improvement   Sleep  Sleep: Sleep: Good   Physical Exam  Physical Exam Vitals reviewed.  Constitutional:      General: She is not in acute distress.     Appearance: She is not ill-appearing, toxic-appearing or diaphoretic.  HENT:     Head: Normocephalic.  Pulmonary:     Effort: Pulmonary effort is normal.     Breath sounds: Normal breath sounds.  Musculoskeletal:     Cervical back: Normal range of motion.     Right lower leg: No edema.     Left lower leg: No edema.  Neurological:     General: No focal deficit present.     Mental Status: She is alert and oriented to person, place, and time.    Review of Systems  Constitutional:  Positive for malaise/fatigue.  Respiratory:  Negative for shortness of breath.   Cardiovascular:  Negative for chest pain, orthopnea and leg swelling.  Gastrointestinal:  Negative for abdominal pain, nausea and vomiting.  Musculoskeletal:  Negative for myalgias.  Neurological:  Negative for dizziness, tremors and headaches.  Psychiatric/Behavioral: Negative.     Blood pressure 111/82, pulse 72, temperature 98.1 F (36.7 C), temperature source Oral, resp. rate 18, SpO2 100 %. There is no height or weight on file to calculate BMI.   Plan:  Coordination of Care with TOC /SW and DSS to secure placement for patient Patient has been psychiatrically cleared and does not meet criteria for inpatient psychiatric treatment.  Disposition is pending possible placement at [facility].    [name], [id number] 03/01/2022 10:08 AM

## 2022-03-01 NOTE — ED Notes (Signed)
Pt sleeping but easily aroused. A/O x 4 . Denies c/o SI/HI/AVH. No noted distress. Will continue to monitor for safety.

## 2022-03-01 NOTE — ED Notes (Signed)
Pt resting quietly with eyes closed.  No pain or discomfort noted/voiced.  Breathing is even and unlabored.  Will continue to monitor for safety.  

## 2022-03-01 NOTE — Progress Notes (Signed)
Teresa Murillo remained in the milieu throughout the morning resting in her bed and watching the TV. No complaints verbalized.

## 2022-03-01 NOTE — Progress Notes (Addendum)
Received Teresa Murillo this AM asleep in her chair bed, she woke up on her own, received breakfast and her medications. She returned to bed. She denied all of the psychiatric symptoms this AM.

## 2022-03-02 DIAGNOSIS — F209 Schizophrenia, unspecified: Secondary | ICD-10-CM | POA: Diagnosis not present

## 2022-03-02 LAB — CBC WITH DIFFERENTIAL/PLATELET
Abs Immature Granulocytes: 0.01 10*3/uL (ref 0.00–0.07)
Basophils Absolute: 0.1 10*3/uL (ref 0.0–0.1)
Basophils Relative: 1 %
Eosinophils Absolute: 0.1 10*3/uL (ref 0.0–0.5)
Eosinophils Relative: 1 %
HCT: 31.5 % — ABNORMAL LOW (ref 36.0–46.0)
Hemoglobin: 9.7 g/dL — ABNORMAL LOW (ref 12.0–15.0)
Immature Granulocytes: 0 %
Lymphocytes Relative: 29 %
Lymphs Abs: 1.7 10*3/uL (ref 0.7–4.0)
MCH: 23.4 pg — ABNORMAL LOW (ref 26.0–34.0)
MCHC: 30.8 g/dL (ref 30.0–36.0)
MCV: 75.9 fL — ABNORMAL LOW (ref 80.0–100.0)
Monocytes Absolute: 0.5 10*3/uL (ref 0.1–1.0)
Monocytes Relative: 8 %
Neutro Abs: 3.6 10*3/uL (ref 1.7–7.7)
Neutrophils Relative %: 61 %
Platelets: 395 10*3/uL (ref 150–400)
RBC: 4.15 MIL/uL (ref 3.87–5.11)
RDW: 16.4 % — ABNORMAL HIGH (ref 11.5–15.5)
WBC: 6 10*3/uL (ref 4.0–10.5)
nRBC: 0 % (ref 0.0–0.2)

## 2022-03-02 LAB — GLUCOSE, CAPILLARY
Glucose-Capillary: 99 mg/dL (ref 70–99)
Glucose-Capillary: 171 mg/dL — ABNORMAL HIGH (ref 70–99)
Glucose-Capillary: 108 mg/dL — ABNORMAL HIGH (ref 70–99)
Glucose-Capillary: 203 mg/dL — ABNORMAL HIGH (ref 70–99)

## 2022-03-02 MED ORDER — ROSUVASTATIN CALCIUM 20 MG PO TABS
20.0000 mg | ORAL_TABLET | Freq: Every day | ORAL | Status: DC
  Administered 2022-03-02 – 2022-03-14 (×13): 20 mg via ORAL
  Filled 2022-03-02 (×5): qty 1
  Filled 2022-03-02: qty 7
  Filled 2022-03-02 (×8): qty 1

## 2022-03-02 NOTE — ED Notes (Signed)
Patient  sleeping in no acute stress. RR even and unlabored .Environment secured .Will continue to monitor for safely. 

## 2022-03-02 NOTE — ED Notes (Signed)
Patient alert and oriented x 3. Denies SI/HI/AVH. Denies intent or plan to harm self or others. Routine conducted according to faculty protocol. Encourage patient to notify staff with any needs or concerns. Patient verbalized agreement and understanding. Will continue to monitor for safety. 

## 2022-03-02 NOTE — ED Notes (Signed)
Pt asleep in bed. Respirations even and unlabored. Monitoring for safety. 

## 2022-03-02 NOTE — ED Notes (Signed)
Pt in recliner bed, resp even and unlabored. Snoring noted. Will continue to monitor for safety

## 2022-03-02 NOTE — ED Provider Notes (Signed)
Behavioral Health Progress Note  Date and Time: 03/02/2022 9:27 AM Name: Teresa Murillo MRN:  [id number]    Diagnosis:  Final diagnoses:  Schizophrenia, unspecified type ([place])    Total Time spent with patient: 15 minutes  Brief HPI: Teresa Murillo is a 62 y.o. female with PMH of schizophrenia, borderline intellectual functioning, tobacco use d/o, HTN, HLD, DM2 (insulin-dependent), atrial flutter, COPD, admitted to  [facility] on 11/23/21 voluntarily, accompanied by [employer], and reported that she was locked out of her group home. It was later confirmed that Teresa Murillo was dismissed from group home due to recurrent history of eloping from the group home. Patient was evaluated here subsequently discharged from [facility], however has remained at this facility as a boarder while DSS continues efforts to find permanent placement for Teresa Murillo.   Brief summary of cognitive evaluations completed during stay here at [facility] per chart review:  (AID to Capacity Evaluation (ACE) completed by Dr. Louis Murillo, Dr. Dwyane Murillo on 12/11/2021.  Please see media tab for full details.    Teresa Harman, PhD completed: Wechsler Adult Intelligence Scale-4, Teresa Murillo achieved a full-scale IQ score of 73 and a percentile rank of 4 placing her in the borderline range of intellectual functioning (12/14/2021) Please see consult note from Teresa Harman, PhD on 12/14/2021.)   Patient continues to be psychiatrically cleared and has no apparent acute psychiatric crisis.  Patient does not meet inpatient psychiatric treatment criteria.  Current plan as of 02/28/2022: Disposition is pending placement, possible placement at [facility].  Subjective:   Patient was initially seen awake, sitting up on the unit, no acute distress. Patient was pleasant, calm, and cooperative with evaluation. Patient denied any acute concerns. Reported that she continues to tolerate her medications well without side effects. Sleep and appetite are good. Her  mood is "bored" but good, requested a romance book. Discussed high cholesterol and importance of statin therapy, patient was amenable to starting high intensity statin. Denied involuntary movements, stiff muscles, or difficulty swallowing. She denied SI/HI/AVH, paranoia, contracted to safety. She had no other questions and was amenable to plan per below.  Review of Systems  Constitutional:  Negative for malaise/fatigue.  Respiratory:  Negative for shortness of breath.   Cardiovascular:  Negative for chest pain and leg swelling.  Gastrointestinal:  Negative for abdominal pain, constipation, diarrhea, nausea and vomiting.  Musculoskeletal:  Negative for back pain and myalgias.  Neurological:  Negative for dizziness, tremors and headaches.     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 On the Wechsler Adult Intelligence Scale-4, Teresa Murillo achieved a full-scale IQ score of 73 and a percentile rank of 4 placing her in the borderline range of intellectual functioning.    Chronic obstructive pulmonary disease (COPD) (HCC)    Glaucoma    Hyperlipidemia    Hypertension    Iron deficiency    Schizoaffective disorder (HCC)    Type 2 diabetes mellitus (HCC)     Past Surgical History:  Procedure Laterality Date   TUBAL LIGATION     Family History:  Family History  Problem Relation Age of Onset   Breast cancer Maternal Grandmother     Social History:  Social History   Substance and Sexual Activity  Alcohol Use Not Currently     Social History   Substance and Sexual Activity  Drug Use Not Currently    Social  History   Socioeconomic History   Marital status: Divorced    Spouse name: Not on file   Number of children: Not on file   Years of education: Not on file   Highest education level: Not on file  Occupational History   Not on file  Tobacco  Use   Smoking status: Former    Packs/day: 1.00    Types: Cigars, Cigarettes    Quit date: 11/09/2021    Years since quitting: 0.3   Smokeless tobacco: Current  Vaping Use   Vaping Use: Never used  Substance and Sexual Activity   Alcohol use: Not Currently   Drug use: Not Currently   Sexual activity: Not Currently    Birth control/protection: Surgical  Other Topics Concern   Not on file  Social History Narrative   Not on file   Social Determinants of Health   Financial Resource Strain: Not on file  Food Insecurity: Not on file  Transportation Needs: Not on file  Physical Activity: Not on file  Stress: Not on file  Social Connections: Not on file   SDOH:  SDOH Screenings   Depression (PHQ2-9): Low Risk  (11/23/2021)  Tobacco Use: High Risk (02/17/2022)  Current Medications:  Current Facility-Administered Medications  Medication Dose Route Frequency Provider Last Rate Last Admin   acetaminophen (TYLENOL) tablet 650 mg  650 mg Oral Q6H PRN [name], NP   650 mg at 02/27/22 2132   albuterol (VENTOLIN HFA) 108 (90 Base) MCG/ACT inhaler 2 puff  2 puff Inhalation Q6H PRN [name], NP   2 puff at 02/20/22 1944   alum & mag hydroxide-simeth (MAALOX/MYLANTA) 200-200-20 MG/5ML suspension 30 mL  30 mL Oral Q4H PRN [name], NP   30 mL at 02/28/22 0934   apixaban (ELIQUIS) tablet 5 mg  5 mg Oral BID [name], NP   5 mg at 03/02/22 7741   ARIPiprazole (ABILIFY) tablet 10 mg  10 mg Oral Daily [name], NP   10 mg at 03/02/22 0920   cloZAPine (CLOZARIL) tablet 75 mg  75 mg Oral BID France Ravens, MD   75 mg at 03/02/22 2878   colchicine tablet 0.6 mg  0.6 mg Oral QHS [name], NP   0.6 mg at 03/01/22 2117   diltiazem (CARDIZEM CD) 24 hr capsule 240 mg  240 mg Oral Daily [name], NP   240 mg at 03/02/22 0921   divalproex (DEPAKOTE ER) 24 hr tablet 500 mg  500 mg Oral BID [name], NP   500 mg at 03/02/22 6767   haloperidol (HALDOL) tablet 10 mg  10 mg  Oral TID PRN [name], NP       insulin aspart (novoLOG) injection 0-9 Units  0-9 Units Subcutaneous TID [facility] France Ravens, MD   3 Units at 03/01/22 1727   insulin glargine-yfgn (SEMGLEE) injection 12 Units  12 Units Subcutaneous Daily [name], NP   12 Units at 03/02/22 0921   latanoprost (XALATAN) 0.005 % ophthalmic solution 1 drop  1 drop Both Eyes QHS [name], NP   1 drop at 03/01/22 2118   magnesium hydroxide (MILK OF MAGNESIA) suspension 30 mL  30 mL Oral Daily PRN [name], NP       metFORMIN (GLUCOPHAGE) tablet 500 mg  500 mg Oral BID WC [name], NP   500 mg at 03/02/22 0815   mometasone-formoterol (DULERA) 200-5 MCG/ACT inhaler 2 puff  2 puff Inhalation BID [name], NP   2  puff at 03/02/22 0815   nicotine (NICODERM CQ - dosed in mg/24 hr) patch 7 mg  7 mg Transdermal Daily [name], NP   7 mg at 03/02/22 9147   valbenazine (INGREZZA) capsule 40 mg  40 mg Oral q AM [name], NP   40 mg at 03/02/22 0617   Vitamin D (Ergocalciferol) (DRISDOL) 1.25 MG (50000 UNIT) capsule 50,000 Units  50,000 Units Oral Q Leonie Green, NP   50,000 Units at 02/25/22 8295   Current Outpatient Medications  Medication Sig Dispense Refill   Accu-Chek Softclix Lancets lancets Use as directed up to four times daily 100 each 0   apixaban (ELIQUIS) 5 MG TABS tablet Take 1 tablet (5 mg total) by mouth 2 (two) times daily. 60 tablet 0   ARIPiprazole (ABILIFY) 10 MG tablet Take 1 tablet (10 mg total) by mouth daily. 30 tablet 0   Blood Glucose Monitoring Suppl (ACCU-CHEK GUIDE) w/Device KIT Use as directed up to four times daily 1 kit 0   budesonide-formoterol (SYMBICORT) 160-4.5 MCG/ACT inhaler Inhale 2 puffs into the lungs in the morning and at bedtime.     Cholecalciferol (VITAMIN D3) 1.25 MG (50000 UT) CAPS Take 50,000 Units by mouth every Thursday.     clozapine (CLOZARIL) 50 MG tablet Take 1 tablet (50 mg total) by mouth daily. 30 tablet 0   colchicine 0.6 MG tablet Take  1 tablet (0.6 mg total) by mouth at bedtime. 30 tablet 0   diltiazem (CARDIZEM CD) 240 MG 24 hr capsule Take 1 capsule (240 mg total) by mouth daily. (Patient not taking: Reported on 11/24/2021) 30 capsule 0   divalproex (DEPAKOTE ER) 500 MG 24 hr tablet Take 1 tablet (500 mg total) by mouth 2 (two) times daily. 60 tablet 0   glucose blood test strip Use as directed up to four times daily 50 each 0   haloperidol (HALDOL) 10 MG tablet Take 1 tablet (10 mg total) by mouth 3 (three) times daily as needed for agitation (and psychotic symptoms).     INGREZZA 40 MG capsule Take 1 capsule (40 mg total) by mouth in the morning. 30 capsule 0   insulin aspart (NOVOLOG) 100 UNIT/ML FlexPen Before each meal 3 times a day, 140-199 - 2 units, 200-250 - 4 units, 251-299 - 6 units,  300-349 - 8 units,  350 or above 10 units. Insulin PEN if approved, provide syringes and needles if needed.Please switch to any approved short acting Insulin if needed. 15 mL 0   insulin glargine (LANTUS) 100 UNIT/ML Solostar Pen Inject 12 Units into the skin daily. 15 mL 0   Insulin Pen Needle 32G X 4 MM MISC Use 4 times a day with insulin, 1 month supply. 100 each 0   latanoprost (XALATAN) 0.005 % ophthalmic solution Place 1 drop into both eyes at bedtime.     metFORMIN (GLUCOPHAGE) 500 MG tablet Take 500 mg by mouth 2 (two) times daily with a meal.     nicotine (NICODERM CQ - DOSED IN MG/24 HR) 7 mg/24hr patch Place 1 patch (7 mg total) onto the skin daily. 28 patch 0   PROAIR HFA 108 (90 Base) MCG/ACT inhaler Inhale 2 puffs into the lungs every 6 (six) hours as needed for wheezing or shortness of breath.      Labs  Lab Results:  Admission on 02/18/2022  Component Date Value Ref Range Status   Glucose-Capillary 02/18/2022 254 (H)  70 - 99 mg/dL Final   Glucose  reference range applies only to samples taken after fasting for at least 8 hours.   Glucose-Capillary 02/18/2022 112 (H)  70 - 99 mg/dL Final   Glucose reference range  applies only to samples taken after fasting for at least 8 hours.   Glucose-Capillary 02/19/2022 159 (H)  70 - 99 mg/dL Final   Glucose reference range applies only to samples taken after fasting for at least 8 hours.   Glucose-Capillary 02/19/2022 160 (H)  70 - 99 mg/dL Final   Glucose reference range applies only to samples taken after fasting for at least 8 hours.   Glucose-Capillary 02/19/2022 177 (H)  70 - 99 mg/dL Final   Glucose reference range applies only to samples taken after fasting for at least 8 hours.   Glucose-Capillary 02/19/2022 147 (H)  70 - 99 mg/dL Final   Glucose reference range applies only to samples taken after fasting for at least 8 hours.   Glucose-Capillary 02/20/2022 126 (H)  70 - 99 mg/dL Final   Glucose reference range applies only to samples taken after fasting for at least 8 hours.   Glucose-Capillary 02/20/2022 132 (H)  70 - 99 mg/dL Final   Glucose reference range applies only to samples taken after fasting for at least 8 hours.   Glucose-Capillary 02/20/2022 125 (H)  70 - 99 mg/dL Final   Glucose reference range applies only to samples taken after fasting for at least 8 hours.   Glucose-Capillary 02/21/2022 151 (H)  70 - 99 mg/dL Final   Glucose reference range applies only to samples taken after fasting for at least 8 hours.   Glucose-Capillary 02/21/2022 106 (H)  70 - 99 mg/dL Final   Glucose reference range applies only to samples taken after fasting for at least 8 hours.   Glucose-Capillary 02/21/2022 149 (H)  70 - 99 mg/dL Final   Glucose reference range applies only to samples taken after fasting for at least 8 hours.   Glucose-Capillary 02/22/2022 131 (H)  70 - 99 mg/dL Final   Glucose reference range applies only to samples taken after fasting for at least 8 hours.   Glucose-Capillary 02/22/2022 141 (H)  70 - 99 mg/dL Final   Glucose reference range applies only to samples taken after fasting for at least 8 hours.   Glucose-Capillary 02/22/2022 183  (H)  70 - 99 mg/dL Final   Glucose reference range applies only to samples taken after fasting for at least 8 hours.   Glucose-Capillary 02/23/2022 133 (H)  70 - 99 mg/dL Final   Glucose reference range applies only to samples taken after fasting for at least 8 hours.   WBC 02/23/2022 4.8  4.0 - 10.5 K/uL Final   RBC 02/23/2022 3.96  3.87 - 5.11 MIL/uL Final   Hemoglobin 02/23/2022 9.7 (L)  12.0 - 15.0 g/dL Final   HCT 02/23/2022 30.6 (L)  36.0 - 46.0 % Final   MCV 02/23/2022 77.3 (L)  80.0 - 100.0 fL Final   MCH 02/23/2022 24.5 (L)  26.0 - 34.0 pg Final   MCHC 02/23/2022 31.7  30.0 - 36.0 g/dL Final   RDW 02/23/2022 16.5 (H)  11.5 - 15.5 % Final   Platelets 02/23/2022 372  150 - 400 K/uL Final   nRBC 02/23/2022 0.0  0.0 - 0.2 % Final   Neutrophils Relative % 02/23/2022 56  % Final   Neutro Abs 02/23/2022 2.7  1.7 - 7.7 K/uL Final   Lymphocytes Relative 02/23/2022 32  % Final   Lymphs  Abs 02/23/2022 1.5  0.7 - 4.0 K/uL Final   Monocytes Relative 02/23/2022 9  % Final   Monocytes Absolute 02/23/2022 0.4  0.1 - 1.0 K/uL Final   Eosinophils Relative 02/23/2022 2  % Final   Eosinophils Absolute 02/23/2022 0.1  0.0 - 0.5 K/uL Final   Basophils Relative 02/23/2022 1  % Final   Basophils Absolute 02/23/2022 0.1  0.0 - 0.1 K/uL Final   Immature Granulocytes 02/23/2022 0  % Final   Abs Immature Granulocytes 02/23/2022 0.01  0.00 - 0.07 K/uL Final   Performed at [facility], [place] [address] [place], [place] [address]   Glucose-Capillary 02/23/2022 202 (H)  70 - 99 mg/dL Final   Glucose reference range applies only to samples taken after fasting for at least 8 hours.   Glucose-Capillary 02/23/2022 143 (H)  70 - 99 mg/dL Final   Glucose reference range applies only to samples taken after fasting for at least 8 hours.   Glucose-Capillary 02/23/2022 143 (H)  70 - 99 mg/dL Final   Glucose reference range applies only to samples taken after fasting for at least 8 hours.   Glucose-Capillary  02/24/2022 207 (H)  70 - 99 mg/dL Final   Glucose reference range applies only to samples taken after fasting for at least 8 hours.   Glucose-Capillary 02/24/2022 109 (H)  70 - 99 mg/dL Final   Glucose reference range applies only to samples taken after fasting for at least 8 hours.   Glucose-Capillary 02/24/2022 127 (H)  70 - 99 mg/dL Final   Glucose reference range applies only to samples taken after fasting for at least 8 hours.   Glucose-Capillary 02/24/2022 130 (H)  70 - 99 mg/dL Final   Glucose reference range applies only to samples taken after fasting for at least 8 hours.   Glucose-Capillary 02/25/2022 117 (H)  70 - 99 mg/dL Final   Glucose reference range applies only to samples taken after fasting for at least 8 hours.   Glucose-Capillary 02/25/2022 104 (H)  70 - 99 mg/dL Final   Glucose reference range applies only to samples taken after fasting for at least 8 hours.   Glucose-Capillary 02/25/2022 180 (H)  70 - 99 mg/dL Final   Glucose reference range applies only to samples taken after fasting for at least 8 hours.   Glucose-Capillary 02/25/2022 180 (H)  70 - 99 mg/dL Final   Glucose reference range applies only to samples taken after fasting for at least 8 hours.   Glucose-Capillary 02/26/2022 133 (H)  70 - 99 mg/dL Final   Glucose reference range applies only to samples taken after fasting for at least 8 hours.   Glucose-Capillary 02/26/2022 85  70 - 99 mg/dL Final   Glucose reference range applies only to samples taken after fasting for at least 8 hours.   Glucose-Capillary 02/26/2022 136 (H)  70 - 99 mg/dL Final   Glucose reference range applies only to samples taken after fasting for at least 8 hours.   Glucose-Capillary 02/26/2022 170 (H)  70 - 99 mg/dL Final   Glucose reference range applies only to samples taken after fasting for at least 8 hours.   Glucose-Capillary 02/26/2022 185 (H)  70 - 99 mg/dL Final   Glucose reference range applies only to samples taken after  fasting for at least 8 hours.   Glucose-Capillary 02/27/2022 124 (H)  70 - 99 mg/dL Final   Glucose reference range applies only to samples taken after fasting for at  least 8 hours.   Glucose-Capillary 02/27/2022 161 (H)  70 - 99 mg/dL Final   Glucose reference range applies only to samples taken after fasting for at least 8 hours.   Glucose-Capillary 02/27/2022 170 (H)  70 - 99 mg/dL Final   Glucose reference range applies only to samples taken after fasting for at least 8 hours.   Glucose-Capillary 02/27/2022 154 (H)  70 - 99 mg/dL Final   Glucose reference range applies only to samples taken after fasting for at least 8 hours.   Glucose-Capillary 02/28/2022 109 (H)  70 - 99 mg/dL Final   Glucose reference range applies only to samples taken after fasting for at least 8 hours.   Glucose-Capillary 02/28/2022 134 (H)  70 - 99 mg/dL Final   Glucose reference range applies only to samples taken after fasting for at least 8 hours.   Glucose-Capillary 02/28/2022 160 (H)  70 - 99 mg/dL Final   Glucose reference range applies only to samples taken after fasting for at least 8 hours.   Glucose-Capillary 02/28/2022 136 (H)  70 - 99 mg/dL Final   Glucose reference range applies only to samples taken after fasting for at least 8 hours.   Glucose-Capillary 02/28/2022 173 (H)  70 - 99 mg/dL Final   Glucose reference range applies only to samples taken after fasting for at least 8 hours.   Glucose-Capillary 03/01/2022 134 (H)  70 - 99 mg/dL Final   Glucose reference range applies only to samples taken after fasting for at least 8 hours.   Glucose-Capillary 03/01/2022 116 (H)  70 - 99 mg/dL Final   Glucose reference range applies only to samples taken after fasting for at least 8 hours.   Glucose-Capillary 03/01/2022 227 (H)  70 - 99 mg/dL Final   Glucose reference range applies only to samples taken after fasting for at least 8 hours.   Glucose-Capillary 03/01/2022 157 (H)  70 - 99 mg/dL Final   Glucose  reference range applies only to samples taken after fasting for at least 8 hours.   Glucose-Capillary 03/02/2022 99  70 - 99 mg/dL Final   Glucose reference range applies only to samples taken after fasting for at least 8 hours.  Admission on 02/17/2022, Discharged on 02/18/2022  Component Date Value Ref Range Status   Sodium 02/17/2022 136  135 - 145 mmol/L Final   Potassium 02/17/2022 3.9  3.5 - 5.1 mmol/L Final   Chloride 02/17/2022 101  98 - 111 mmol/L Final   CO2 02/17/2022 25  22 - 32 mmol/L Final   Glucose, Bld 02/17/2022 113 (H)  70 - 99 mg/dL Final   Glucose reference range applies only to samples taken after fasting for at least 8 hours.   BUN 02/17/2022 9  6 - 20 mg/dL Final   Creatinine, Ser 02/17/2022 0.50  0.44 - 1.00 mg/dL Final   Calcium 02/17/2022 8.9  8.9 - 10.3 mg/dL Final   GFR, Estimated 02/17/2022 >60  >60 mL/min Final   Comment: (NOTE) Calculated using the CKD-EPI Creatinine Equation (2021)    Anion gap 02/17/2022 10  5 - 15 Final   Performed at [facility], [place] [address] [place], [place] [address]   WBC 02/17/2022 6.5  4.0 - 10.5 K/uL Final   RBC 02/17/2022 4.24  3.87 - 5.11 MIL/uL Final   Hemoglobin 02/17/2022 10.2 (L)  12.0 - 15.0 g/dL Final   HCT 02/17/2022 33.2 (L)  36.0 - 46.0 % Final   MCV 02/17/2022  78.3 (L)  80.0 - 100.0 fL Final   MCH 02/17/2022 24.1 (L)  26.0 - 34.0 pg Final   MCHC 02/17/2022 30.7  30.0 - 36.0 g/dL Final   RDW 02/17/2022 17.2 (H)  11.5 - 15.5 % Final   Platelets 02/17/2022 390  150 - 400 K/uL Final   nRBC 02/17/2022 0.0  0.0 - 0.2 % Final   Performed at [facility], [place] [address], [place], [place] [address]   Troponin I (High Sensitivity) 02/17/2022 4  <18 ng/L Final   Comment: (NOTE) Elevated high sensitivity troponin I (hsTnI) values and significant  changes across serial measurements may suggest ACS but many other  chronic and acute conditions are known to elevate hsTnI results.  Refer to the "Links" section  for chest pain algorithms and additional  guidance. Performed at [facility], [place] [address], [place], [place] [address]    Valproic Acid Lvl 02/17/2022 17 (L)  50.0 - 100.0 ug/mL Final   Performed at [facility] [address], [place], [place] [address]   B Natriuretic Peptide 02/17/2022 30.9  0.0 - 100.0 pg/mL Final   Performed at [facility] [address] [place], [place] [address]   Troponin I (High Sensitivity) 02/18/2022 4  <18 ng/L Final   Comment: (NOTE) Elevated high sensitivity troponin I (hsTnI) values and significant  changes across serial measurements may suggest ACS but many other  chronic and acute conditions are known to elevate hsTnI results.  Refer to the "Links" section for chest pain algorithms and additional  guidance. Performed at [facility], [place] [address], [place], [place] [address]    D-Dimer, Quant 02/18/2022 0.29  0.00 - 0.50 ug/mL-FEU Final   Comment: (NOTE) At the manufacturer cut-off value of 0.5 g/mL FEU, this assay has a negative predictive value of 95-100%.This assay is intended for use in conjunction with a clinical pretest probability (PTP) assessment model to exclude pulmonary embolism (PE) and deep venous thrombosis (DVT) in outpatients suspected of PE or DVT. Results should be correlated with clinical presentation. Performed at [facility], [place] [address], [place], [place] [address]   Admission on 02/09/2022, Discharged on 02/17/2022  Component Date Value Ref Range Status   Glucose-Capillary 02/09/2022 118 (H)  70 - 99 mg/dL Final   Glucose reference range applies only to samples taken after fasting for at least 8 hours.   Glucose-Capillary 02/10/2022 148 (H)  70 - 99 mg/dL Final   Glucose reference range applies only to samples taken after fasting for at least 8 hours.   Glucose-Capillary 02/10/2022 174 (H)  70 - 99 mg/dL Final   Glucose reference range applies only to samples taken after fasting for  at least 8 hours.   Glucose-Capillary 02/10/2022 128 (H)  70 - 99 mg/dL Final   Glucose reference range applies only to samples taken after fasting for at least 8 hours.   Glucose-Capillary 02/10/2022 182 (H)  70 - 99 mg/dL Final   Glucose reference range applies only to samples taken after fasting for at least 8 hours.   Glucose-Capillary 02/11/2022 158 (H)  70 - 99 mg/dL Final   Glucose reference range applies only to samples taken after fasting for at least 8 hours.   Glucose-Capillary 02/11/2022 159 (H)  70 - 99 mg/dL Final   Glucose reference range applies only to samples taken after fasting for at least 8 hours.   Glucose-Capillary 02/11/2022 153 (H)  70 - 99 mg/dL Final   Glucose reference range applies only to samples taken after fasting for at least 8 hours.   Glucose-Capillary 02/11/2022 159 (H)  70 - 99 mg/dL Final   Glucose reference range applies only to samples taken after fasting for at least 8 hours.   Glucose-Capillary 02/11/2022 153 (H)  70 - 99 mg/dL Final   Glucose reference range applies only to samples taken after fasting for at least 8 hours.   Glucose-Capillary 02/11/2022 109 (H)  70 - 99 mg/dL Final   Glucose reference range applies only to samples taken after fasting for at least 8 hours.   Glucose-Capillary 02/11/2022 213 (H)  70 - 99 mg/dL Final   Glucose reference range applies only to samples taken after fasting for at least 8 hours.   Glucose-Capillary 02/11/2022 229 (H)  70 - 99 mg/dL Final   Glucose reference range applies only to samples taken after fasting for at least 8 hours.   Glucose-Capillary 02/12/2022 128 (H)  70 - 99 mg/dL Final   Glucose reference range applies only to samples taken after fasting for at least 8 hours.   Glucose-Capillary 02/12/2022 149 (H)  70 - 99 mg/dL Final   Glucose reference range applies only to samples taken after fasting for at least 8 hours.   Color, Urine 02/12/2022 YELLOW  YELLOW Final   APPearance 02/12/2022 CLEAR   CLEAR Final   Specific Gravity, Urine 02/12/2022 1.015  1.005 - 1.030 Final   pH 02/12/2022 5.0  5.0 - 8.0 Final   Glucose, UA 02/12/2022 NEGATIVE  NEGATIVE mg/dL Final   Hgb urine dipstick 02/12/2022 NEGATIVE  NEGATIVE Final   Bilirubin Urine 02/12/2022 NEGATIVE  NEGATIVE Final   Ketones, ur 02/12/2022 NEGATIVE  NEGATIVE mg/dL Final   Protein, ur 02/12/2022 NEGATIVE  NEGATIVE mg/dL Final   Nitrite 02/12/2022 NEGATIVE  NEGATIVE Final   Leukocytes,Ua 02/12/2022 NEGATIVE  NEGATIVE Final   Performed at [facility], [place] [address], [place], [place] [address]   Specimen Description 02/12/2022 URINE, CLEAN CATCH   Final   Special Requests 02/12/2022    Final                   Value:NONE Performed at [facility] [facility], [place] [address] [place], [place] [address]    Culture 02/12/2022 10,000 COLONIES/mL PROTEUS MIRABILIS (A)   Final   Report Status 02/12/2022 02/15/2022 FINAL   Final   Organism ID, Bacteria 02/12/2022 PROTEUS MIRABILIS (A)   Final   Glucose-Capillary 02/12/2022 128 (H)  70 - 99 mg/dL Final   Glucose reference range applies only to samples taken after fasting for at least 8 hours.   Glucose-Capillary 02/12/2022 196 (H)  70 - 99 mg/dL Final   Glucose reference range applies only to samples taken after fasting for at least 8 hours.   Glucose-Capillary 02/13/2022 168 (H)  70 - 99 mg/dL Final   Glucose reference range applies only to samples taken after fasting for at least 8 hours.   Glucose-Capillary 02/13/2022 153 (H)  70 - 99 mg/dL Final   Glucose reference range applies only to samples taken after fasting for at least 8 hours.   Glucose-Capillary 02/13/2022 134 (H)  70 - 99 mg/dL Final   Glucose reference range applies only to samples taken after fasting for at least 8 hours.   Glucose-Capillary 02/13/2022 178 (H)  70 - 99 mg/dL Final   Glucose reference range applies only to samples taken after  fasting for at least 8 hours.   Glucose-Capillary 02/14/2022 156 (H)  70 -  99 mg/dL Final   Glucose reference range applies only to samples taken after fasting for at least 8 hours.   Glucose-Capillary 02/14/2022 169 (H)  70 - 99 mg/dL Final   Glucose reference range applies only to samples taken after fasting for at least 8 hours.   Glucose-Capillary 02/14/2022 156 (H)  70 - 99 mg/dL Final   Glucose reference range applies only to samples taken after fasting for at least 8 hours.   Glucose-Capillary 02/14/2022 148 (H)  70 - 99 mg/dL Final   Glucose reference range applies only to samples taken after fasting for at least 8 hours.   Glucose-Capillary 02/15/2022 159 (H)  70 - 99 mg/dL Final   Glucose reference range applies only to samples taken after fasting for at least 8 hours.   Glucose-Capillary 02/15/2022 125 (H)  70 - 99 mg/dL Final   Glucose reference range applies only to samples taken after fasting for at least 8 hours.   Glucose-Capillary 02/15/2022 142 (H)  70 - 99 mg/dL Final   Glucose reference range applies only to samples taken after fasting for at least 8 hours.   Glucose-Capillary 02/15/2022 128 (H)  70 - 99 mg/dL Final   Glucose reference range applies only to samples taken after fasting for at least 8 hours.   Glucose-Capillary 02/16/2022 137 (H)  70 - 99 mg/dL Final   Glucose reference range applies only to samples taken after fasting for at least 8 hours.   WBC 02/16/2022 6.0  4.0 - 10.5 K/uL Final   RBC 02/16/2022 4.48  3.87 - 5.11 MIL/uL Final   Hemoglobin 02/16/2022 10.7 (L)  12.0 - 15.0 g/dL Final   HCT 02/16/2022 35.4 (L)  36.0 - 46.0 % Final   MCV 02/16/2022 79.0 (L)  80.0 - 100.0 fL Final   MCH 02/16/2022 23.9 (L)  26.0 - 34.0 pg Final   MCHC 02/16/2022 30.2  30.0 - 36.0 g/dL Final   RDW 02/16/2022 17.0 (H)  11.5 - 15.5 % Final   Platelets 02/16/2022 387  150 - 400 K/uL Final   nRBC 02/16/2022 0.0  0.0 - 0.2 % Final   Neutrophils Relative % 02/16/2022 54  % Final   Neutro Abs 02/16/2022 3.2  1.7 - 7.7 K/uL Final   Lymphocytes  Relative 02/16/2022 35  % Final   Lymphs Abs 02/16/2022 2.1  0.7 - 4.0 K/uL Final   Monocytes Relative 02/16/2022 6  % Final   Monocytes Absolute 02/16/2022 0.4  0.1 - 1.0 K/uL Final   Eosinophils Relative 02/16/2022 2  % Final   Eosinophils Absolute 02/16/2022 0.1  0.0 - 0.5 K/uL Final   Basophils Relative 02/16/2022 2  % Final   Basophils Absolute 02/16/2022 0.1  0.0 - 0.1 K/uL Final   Immature Granulocytes 02/16/2022 1  % Final   Abs Immature Granulocytes 02/16/2022 0.04  0.00 - 0.07 K/uL Final   Performed at [facility], [place] [address], [place], [place] [address]   Glucose-Capillary 02/16/2022 131 (H)  70 - 99 mg/dL Final   Glucose reference range applies only to samples taken after fasting for at least 8 hours.   Glucose-Capillary 02/16/2022 123 (H)  70 - 99 mg/dL Final   Glucose reference range applies only to samples taken after fasting for at least 8 hours.   Glucose-Capillary 02/16/2022 195 (H)  70 - 99 mg/dL Final   Glucose  reference range applies only to samples taken after fasting for at least 8 hours.   Glucose-Capillary 02/17/2022 148 (H)  70 - 99 mg/dL Final   Glucose reference range applies only to samples taken after fasting for at least 8 hours.  Admission on 02/08/2022, Discharged on 02/09/2022  Component Date Value Ref Range Status   B Natriuretic Peptide 02/08/2022 40.2  0.0 - 100.0 pg/mL Final   Performed at [facility], [address] [place], [place] [address]   Troponin I (High Sensitivity) 02/08/2022 4  <18 ng/L Final   Comment: (NOTE) Elevated high sensitivity troponin I (hsTnI) values and significant  changes across serial measurements may suggest ACS but many other  chronic and acute conditions are known to elevate hsTnI results.  Refer to the "Links" section for chest pain algorithms and additional  guidance. Performed at [facility], [place] [address], [place], [place] [address]    WBC 02/08/2022 8.5  4.0 - 10.5 K/uL Final   RBC  02/08/2022 4.30  3.87 - 5.11 MIL/uL Final   Hemoglobin 02/08/2022 10.7 (L)  12.0 - 15.0 g/dL Final   HCT 02/08/2022 33.1 (L)  36.0 - 46.0 % Final   MCV 02/08/2022 77.0 (L)  80.0 - 100.0 fL Final   MCH 02/08/2022 24.9 (L)  26.0 - 34.0 pg Final   MCHC 02/08/2022 32.3  30.0 - 36.0 g/dL Final   RDW 02/08/2022 16.5 (H)  11.5 - 15.5 % Final   Platelets 02/08/2022 361  150 - 400 K/uL Final   nRBC 02/08/2022 0.0  0.0 - 0.2 % Final   Performed at [facility] [address], [place], [place] [address]   Sodium 02/08/2022 131 (L)  135 - 145 mmol/L Final   Potassium 02/08/2022 4.4  3.5 - 5.1 mmol/L Final   Chloride 02/08/2022 96 (L)  98 - 111 mmol/L Final   CO2 02/08/2022 25  22 - 32 mmol/L Final   Glucose, Bld 02/08/2022 126 (H)  70 - 99 mg/dL Final   Glucose reference range applies only to samples taken after fasting for at least 8 hours.   BUN 02/08/2022 11  6 - 20 mg/dL Final   Creatinine, Ser 02/08/2022 0.52  0.44 - 1.00 mg/dL Final   Calcium 02/08/2022 9.4  8.9 - 10.3 mg/dL Final   Total Protein 02/08/2022 7.5  6.5 - 8.1 g/dL Final   Albumin 02/08/2022 3.6  3.5 - 5.0 g/dL Final   AST 02/08/2022 22  15 - 41 U/L Final   ALT 02/08/2022 20  0 - 44 U/L Final   Alkaline Phosphatase 02/08/2022 67  38 - 126 U/L Final   Total Bilirubin 02/08/2022 0.1 (L)  0.3 - 1.2 mg/dL Final   GFR, Estimated 02/08/2022 >60  >60 mL/min Final   Comment: (NOTE) Calculated using the CKD-EPI Creatinine Equation (2021)    Anion gap 02/08/2022 10  5 - 15 Final   Performed at [facility] [address], [place], [place] [address]   Troponin I (High Sensitivity) 02/08/2022 5  <18 ng/L Final   Comment: (NOTE) Elevated high sensitivity troponin I (hsTnI) values and significant  changes across serial measurements may suggest ACS but many other  chronic and acute conditions are known to elevate hsTnI results.  Refer to the "Links" section for chest pain algorithms and additional  guidance. Performed at  [facility], [place] [address], [place], [place] [address]    WBC 02/09/2022 7.3  4.0 - 10.5 K/uL Final   RBC 02/09/2022 4.70  3.87 - 5.11 MIL/uL Final   Hemoglobin 02/09/2022 11.4 (L)  12.0 - 15.0 g/dL Final   HCT 02/09/2022 36.6  36.0 - 46.0 % Final   MCV 02/09/2022 77.9 (L)  80.0 - 100.0 fL Final   MCH 02/09/2022 24.3 (L)  26.0 - 34.0 pg Final   MCHC 02/09/2022 31.1  30.0 - 36.0 g/dL Final   RDW 02/09/2022 16.6 (H)  11.5 - 15.5 % Final   Platelets 02/09/2022 403 (H)  150 - 400 K/uL Final   nRBC 02/09/2022 0.0  0.0 - 0.2 % Final   Performed at [facility] [address], [place], [place] [address]   Glucose-Capillary 02/08/2022 117 (H)  70 - 99 mg/dL Final   Glucose reference range applies only to samples taken after fasting for at least 8 hours.   Sodium 02/09/2022 138  135 - 145 mmol/L Final   Potassium 02/09/2022 3.7  3.5 - 5.1 mmol/L Final   Chloride 02/09/2022 103  98 - 111 mmol/L Final   CO2 02/09/2022 24  22 - 32 mmol/L Final   Glucose, Bld 02/09/2022 134 (H)  70 - 99 mg/dL Final   Glucose reference range applies only to samples taken after fasting for at least 8 hours.   BUN 02/09/2022 9  6 - 20 mg/dL Final   Creatinine, Ser 02/09/2022 0.44  0.44 - 1.00 mg/dL Final   Calcium 02/09/2022 8.3 (L)  8.9 - 10.3 mg/dL Final   GFR, Estimated 02/09/2022 >60  >60 mL/min Final   Comment: (NOTE) Calculated using the CKD-EPI Creatinine Equation (2021)    Anion gap 02/09/2022 11  5 - 15 Final   Performed at [facility], [place] [address], [place], [place] [address]   Iron 02/09/2022 20 (L)  28 - 170 ug/dL Final   TIBC 02/09/2022 455 (H)  250 - 450 ug/dL Final   Saturation Ratios 02/09/2022 4 (L)  10.4 - 31.8 % Final   UIBC 02/09/2022 435  ug/dL Final   Performed at [facility], [place] [address], [place], [place] [address]   Ferritin 02/09/2022 2 (L)  11 - 307 ng/mL Final   Performed at [facility] [address], [place], [place] [address]   Weight  02/09/2022 2,846.58  oz Final   Height 02/09/2022 62  in Final   BP 02/09/2022 143/80  mmHg Final   WBC 02/09/2022 8.1  4.0 - 10.5 K/uL Final   RBC 02/09/2022 4.38  3.87 - 5.11 MIL/uL Final   Hemoglobin 02/09/2022 10.9 (L)  12.0 - 15.0 g/dL Final   HCT 02/09/2022 33.3 (L)  36.0 - 46.0 % Final   MCV 02/09/2022 76.0 (L)  80.0 - 100.0 fL Final   MCH 02/09/2022 24.9 (L)  26.0 - 34.0 pg Final   MCHC 02/09/2022 32.7  30.0 - 36.0 g/dL Final   RDW 02/09/2022 16.5 (H)  11.5 - 15.5 % Final   Platelets 02/09/2022 397  150 - 400 K/uL Final   nRBC 02/09/2022 0.0  0.0 - 0.2 % Final   Neutrophils Relative % 02/09/2022 66  % Final   Neutro Abs 02/09/2022 5.4  1.7 - 7.7 K/uL Final   Lymphocytes Relative 02/09/2022 24  % Final   Lymphs Abs 02/09/2022 1.9  0.7 - 4.0 K/uL Final   Monocytes Relative 02/09/2022 8  % Final   Monocytes Absolute 02/09/2022 0.6  0.1 - 1.0 K/uL  Final   Eosinophils Relative 02/09/2022 1  % Final   Eosinophils Absolute 02/09/2022 0.1  0.0 - 0.5 K/uL Final   Basophils Relative 02/09/2022 1  % Final   Basophils Absolute 02/09/2022 0.1  0.0 - 0.1 K/uL Final   Immature Granulocytes 02/09/2022 0  % Final   Abs Immature Granulocytes 02/09/2022 0.03  0.00 - 0.07 K/uL Final   Performed at [facility], [place] [address], [place], [place] [address]   Glucose-Capillary 02/09/2022 238 (H)  70 - 99 mg/dL Final   Glucose reference range applies only to samples taken after fasting for at least 8 hours.   Glucose-Capillary 02/09/2022 91  70 - 99 mg/dL Final   Glucose reference range applies only to samples taken after fasting for at least 8 hours.   CRP 02/09/2022 <0.5  <1.0 mg/dL Final   Performed at [facility] [address], [place], [place] [address]   Sed Rate 02/09/2022 15  0 - 22 mm/hr Final   Performed at [facility] [address] [place], [place] [address]   Glucose-Capillary 02/09/2022 137 (H)  70 - 99 mg/dL Final   Glucose reference range applies only to  samples taken after fasting for at least 8 hours.  Admission on 01/24/2022, Discharged on 02/08/2022  Component Date Value Ref Range Status   Glucose-Capillary 01/24/2022 143 (H)  70 - 99 mg/dL Final   Glucose reference range applies only to samples taken after fasting for at least 8 hours.   Glucose-Capillary 01/24/2022 120 (H)  70 - 99 mg/dL Final   Glucose reference range applies only to samples taken after fasting for at least 8 hours.   Glucose-Capillary 01/25/2022 122 (H)  70 - 99 mg/dL Final   Glucose reference range applies only to samples taken after fasting for at least 8 hours.   Glucose-Capillary 01/25/2022 190 (H)  70 - 99 mg/dL Final   Glucose reference range applies only to samples taken after fasting for at least 8 hours.   Glucose-Capillary 01/25/2022 163 (H)  70 - 99 mg/dL Final   Glucose reference range applies only to samples taken after fasting for at least 8 hours.   WBC 01/26/2022 6.5  4.0 - 10.5 K/uL Final   RBC 01/26/2022 4.53  3.87 - 5.11 MIL/uL Final   Hemoglobin 01/26/2022 11.3 (L)  12.0 - 15.0 g/dL Final   HCT 01/26/2022 36.4  36.0 - 46.0 % Final   MCV 01/26/2022 80.4  80.0 - 100.0 fL Final   MCH 01/26/2022 24.9 (L)  26.0 - 34.0 pg Final   MCHC 01/26/2022 31.0  30.0 - 36.0 g/dL Final   RDW 01/26/2022 17.3 (H)  11.5 - 15.5 % Final   Platelets 01/26/2022 327  150 - 400 K/uL Final   nRBC 01/26/2022 0.0  0.0 - 0.2 % Final   Neutrophils Relative % 01/26/2022 60  % Final   Neutro Abs 01/26/2022 3.9  1.7 - 7.7 K/uL Final   Lymphocytes Relative 01/26/2022 31  % Final   Lymphs Abs 01/26/2022 2.0  0.7 - 4.0 K/uL Final   Monocytes Relative 01/26/2022 7  % Final   Monocytes Absolute 01/26/2022 0.5  0.1 - 1.0 K/uL Final   Eosinophils Relative 01/26/2022 1  % Final   Eosinophils Absolute 01/26/2022 0.1  0.0 - 0.5 K/uL Final   Basophils Relative 01/26/2022 1  % Final   Basophils Absolute 01/26/2022 0.1  0.0 - 0.1 K/uL Final   Immature Granulocytes  01/26/2022 0  % Final    Abs Immature Granulocytes 01/26/2022 0.02  0.00 - 0.07 K/uL Final   Performed at [facility], [place] [address] [place], [place] [address]   Glucose-Capillary 01/26/2022 149 (H)  70 - 99 mg/dL Final   Glucose reference range applies only to samples taken after fasting for at least 8 hours.   Glucose-Capillary 01/26/2022 130 (H)  70 - 99 mg/dL Final   Glucose reference range applies only to samples taken after fasting for at least 8 hours.   Glucose-Capillary 01/26/2022 151 (H)  70 - 99 mg/dL Final   Glucose reference range applies only to samples taken after fasting for at least 8 hours.   Glucose-Capillary 01/26/2022 170 (H)  70 - 99 mg/dL Final   Glucose reference range applies only to samples taken after fasting for at least 8 hours.   Glucose-Capillary 01/27/2022 129 (H)  70 - 99 mg/dL Final   Glucose reference range applies only to samples taken after fasting for at least 8 hours.   Glucose-Capillary 01/27/2022 101 (H)  70 - 99 mg/dL Final   Glucose reference range applies only to samples taken after fasting for at least 8 hours.   Glucose-Capillary 01/27/2022 220 (H)  70 - 99 mg/dL Final   Glucose reference range applies only to samples taken after fasting for at least 8 hours.   Glucose-Capillary 01/27/2022 154 (H)  70 - 99 mg/dL Final   Glucose reference range applies only to samples taken after fasting for at least 8 hours.   Glucose-Capillary 01/27/2022 163 (H)  70 - 99 mg/dL Final   Glucose reference range applies only to samples taken after fasting for at least 8 hours.   Glucose-Capillary 01/28/2022 127 (H)  70 - 99 mg/dL Final   Glucose reference range applies only to samples taken after fasting for at least 8 hours.   Glucose-Capillary 01/28/2022 110 (H)  70 - 99 mg/dL Final   Glucose reference range applies only to samples taken after fasting for at least 8 hours.   Glucose-Capillary 01/28/2022 167 (H)  70 - 99 mg/dL Final   Glucose reference range applies only to  samples taken after fasting for at least 8 hours.   Glucose-Capillary 01/29/2022 145 (H)  70 - 99 mg/dL Final   Glucose reference range applies only to samples taken after fasting for at least 8 hours.   Glucose-Capillary 01/29/2022 203 (H)  70 - 99 mg/dL Final   Glucose reference range applies only to samples taken after fasting for at least 8 hours.   Glucose-Capillary 01/29/2022 153 (H)  70 - 99 mg/dL Final   Glucose reference range applies only to samples taken after fasting for at least 8 hours.   Glucose-Capillary 01/29/2022 166 (H)  70 - 99 mg/dL Final   Glucose reference range applies only to samples taken after fasting for at least 8 hours.   Glucose-Capillary 01/30/2022 142 (H)  70 - 99 mg/dL Final   Glucose reference range applies only to samples taken after fasting for at least 8 hours.   Glucose-Capillary 01/30/2022 138 (H)  70 - 99 mg/dL Final   Glucose reference range applies only to samples taken after fasting for at least 8 hours.   Glucose-Capillary 01/30/2022 185 (H)  70 - 99 mg/dL Final   Glucose reference range applies only to samples taken after fasting for at least 8 hours.   Glucose-Capillary 01/30/2022 220 (H)  70 - 99 mg/dL Final  Glucose reference range applies only to samples taken after fasting for at least 8 hours.   Glucose-Capillary 01/31/2022 147 (H)  70 - 99 mg/dL Final   Glucose reference range applies only to samples taken after fasting for at least 8 hours.   Glucose-Capillary 01/31/2022 131 (H)  70 - 99 mg/dL Final   Glucose reference range applies only to samples taken after fasting for at least 8 hours.   Glucose-Capillary 01/31/2022 169 (H)  70 - 99 mg/dL Final   Glucose reference range applies only to samples taken after fasting for at least 8 hours.   Glucose-Capillary 01/31/2022 144 (H)  70 - 99 mg/dL Final   Glucose reference range applies only to samples taken after fasting for at least 8 hours.   Comment 1 01/31/2022 RN AWARE   Final    Glucose-Capillary 02/01/2022 117 (H)  70 - 99 mg/dL Final   Glucose reference range applies only to samples taken after fasting for at least 8 hours.   Glucose-Capillary 02/01/2022 180 (H)  70 - 99 mg/dL Final   Glucose reference range applies only to samples taken after fasting for at least 8 hours.   Glucose-Capillary 02/01/2022 209 (H)  70 - 99 mg/dL Final   Glucose reference range applies only to samples taken after fasting for at least 8 hours.   Glucose-Capillary 02/02/2022 131 (H)  70 - 99 mg/dL Final   Glucose reference range applies only to samples taken after fasting for at least 8 hours.   WBC 02/02/2022 7.5  4.0 - 10.5 K/uL Final   RBC 02/02/2022 4.52  3.87 - 5.11 MIL/uL Final   Hemoglobin 02/02/2022 11.3 (L)  12.0 - 15.0 g/dL Final   HCT 02/02/2022 35.0 (L)  36.0 - 46.0 % Final   MCV 02/02/2022 77.4 (L)  80.0 - 100.0 fL Final   MCH 02/02/2022 25.0 (L)  26.0 - 34.0 pg Final   MCHC 02/02/2022 32.3  30.0 - 36.0 g/dL Final   RDW 02/02/2022 16.7 (H)  11.5 - 15.5 % Final   Platelets 02/02/2022 345  150 - 400 K/uL Final   nRBC 02/02/2022 0.0  0.0 - 0.2 % Final   Neutrophils Relative % 02/02/2022 63  % Final   Neutro Abs 02/02/2022 4.7  1.7 - 7.7 K/uL Final   Lymphocytes Relative 02/02/2022 28  % Final   Lymphs Abs 02/02/2022 2.1  0.7 - 4.0 K/uL Final   Monocytes Relative 02/02/2022 7  % Final   Monocytes Absolute 02/02/2022 0.5  0.1 - 1.0 K/uL Final   Eosinophils Relative 02/02/2022 1  % Final   Eosinophils Absolute 02/02/2022 0.1  0.0 - 0.5 K/uL Final   Basophils Relative 02/02/2022 1  % Final   Basophils Absolute 02/02/2022 0.1  0.0 - 0.1 K/uL Final   Immature Granulocytes 02/02/2022 0  % Final   Abs Immature Granulocytes 02/02/2022 0.03  0.00 - 0.07 K/uL Final   Performed at [facility], [place] [address], [place], [place] [address]   Glucose-Capillary 02/02/2022 95  70 - 99 mg/dL Final   Glucose reference range applies only to samples taken after fasting for at least 8  hours.   Glucose-Capillary 02/02/2022 120 (H)  70 - 99 mg/dL Final   Glucose reference range applies only to samples taken after fasting for at least 8 hours.   Glucose-Capillary 02/02/2022 191 (H)  70 - 99 mg/dL Final   Glucose reference range applies only to samples taken after fasting for at  least 8 hours.   Glucose-Capillary 02/03/2022 148 (H)  70 - 99 mg/dL Final   Glucose reference range applies only to samples taken after fasting for at least 8 hours.   Glucose-Capillary 02/03/2022 122 (H)  70 - 99 mg/dL Final   Glucose reference range applies only to samples taken after fasting for at least 8 hours.   Glucose-Capillary 02/03/2022 233 (H)  70 - 99 mg/dL Final   Glucose reference range applies only to samples taken after fasting for at least 8 hours.   Glucose-Capillary 02/04/2022 126 (H)  70 - 99 mg/dL Final   Glucose reference range applies only to samples taken after fasting for at least 8 hours.   Glucose-Capillary 02/04/2022 117 (H)  70 - 99 mg/dL Final   Glucose reference range applies only to samples taken after fasting for at least 8 hours.   Glucose-Capillary 02/04/2022 135 (H)  70 - 99 mg/dL Final   Glucose reference range applies only to samples taken after fasting for at least 8 hours.   Glucose-Capillary 02/04/2022 197 (H)  70 - 99 mg/dL Final   Glucose reference range applies only to samples taken after fasting for at least 8 hours.   Glucose-Capillary 02/05/2022 170 (H)  70 - 99 mg/dL Final   Glucose reference range applies only to samples taken after fasting for at least 8 hours.   Glucose-Capillary 02/05/2022 107 (H)  70 - 99 mg/dL Final   Glucose reference range applies only to samples taken after fasting for at least 8 hours.   Glucose-Capillary 02/05/2022 184 (H)  70 - 99 mg/dL Final   Glucose reference range applies only to samples taken after fasting for at least 8 hours.   Glucose-Capillary 02/05/2022 256 (H)  70 - 99 mg/dL Final   Glucose reference range  applies only to samples taken after fasting for at least 8 hours.   Glucose-Capillary 02/06/2022 144 (H)  70 - 99 mg/dL Final   Glucose reference range applies only to samples taken after fasting for at least 8 hours.   Glucose-Capillary 02/06/2022 190 (H)  70 - 99 mg/dL Final   Glucose reference range applies only to samples taken after fasting for at least 8 hours.   Glucose-Capillary 02/06/2022 92  70 - 99 mg/dL Final   Glucose reference range applies only to samples taken after fasting for at least 8 hours.   Trichomonas 02/06/2022 Negative   Final   Bacterial Vaginitis-Urine 02/06/2022 Negative   Final   Molecular Comment 02/06/2022 For tests bacteria and/or candida, this specimen does not meet the   Final   Molecular Comment 02/06/2022 strict criteria set by the FDA. The result interpretation should be   Final   Molecular Comment 02/06/2022 considered in conjunction with the patient's clinical history.   Final   Comment 02/06/2022 Normal Reference Range Trichomonas - Negative   Final   Glucose-Capillary 02/06/2022 197 (H)  70 - 99 mg/dL Final   Glucose reference range applies only to samples taken after fasting for at least 8 hours.   Glucose-Capillary 02/07/2022 129 (H)  70 - 99 mg/dL Final   Glucose reference range applies only to samples taken after fasting for at least 8 hours.   Glucose-Capillary 02/07/2022 207 (H)  70 - 99 mg/dL Final   Glucose reference range applies only to samples taken after fasting for at least 8 hours.   Glucose-Capillary 02/07/2022 153 (H)  70 - 99 mg/dL Final   Glucose reference range applies only to  samples taken after fasting for at least 8 hours.   Glucose-Capillary 02/08/2022 129 (H)  70 - 99 mg/dL Final   Glucose reference range applies only to samples taken after fasting for at least 8 hours.   Glucose-Capillary 02/08/2022 107 (H)  70 - 99 mg/dL Final   Glucose reference range applies only to samples taken after fasting for at least 8 hours.   Admission on 01/23/2022, Discharged on 01/24/2022  Component Date Value Ref Range Status   Sodium 01/23/2022 140  135 - 145 mmol/L Final   Potassium 01/23/2022 4.4  3.5 - 5.1 mmol/L Final   Chloride 01/23/2022 101  98 - 111 mmol/L Final   CO2 01/23/2022 25  22 - 32 mmol/L Final   Glucose, Bld 01/23/2022 198 (H)  70 - 99 mg/dL Final   Glucose reference range applies only to samples taken after fasting for at least 8 hours.   BUN 01/23/2022 13  6 - 20 mg/dL Final   Creatinine, Ser 01/23/2022 0.62  0.44 - 1.00 mg/dL Final   Calcium 01/23/2022 9.3  8.9 - 10.3 mg/dL Final   GFR, Estimated 01/23/2022 >60  >60 mL/min Final   Comment: (NOTE) Calculated using the CKD-EPI Creatinine Equation (2021)    Anion gap 01/23/2022 14  5 - 15 Final   Performed at [facility], [place] [address], [place], [place] [address]   WBC 01/23/2022 5.8  4.0 - 10.5 K/uL Final   RBC 01/23/2022 4.63  3.87 - 5.11 MIL/uL Final   Hemoglobin 01/23/2022 11.7 (L)  12.0 - 15.0 g/dL Final   HCT 01/23/2022 37.4  36.0 - 46.0 % Final   MCV 01/23/2022 80.8  80.0 - 100.0 fL Final   MCH 01/23/2022 25.3 (L)  26.0 - 34.0 pg Final   MCHC 01/23/2022 31.3  30.0 - 36.0 g/dL Final   RDW 01/23/2022 17.9 (H)  11.5 - 15.5 % Final   Platelets 01/23/2022 333  150 - 400 K/uL Final   nRBC 01/23/2022 0.0  0.0 - 0.2 % Final   Performed at [facility] [address] [place], [place] [address]   Troponin I (High Sensitivity) 01/23/2022 6  <18 ng/L Final   Comment: (NOTE) Elevated high sensitivity troponin I (hsTnI) values and significant  changes across serial measurements may suggest ACS but many other  chronic and acute conditions are known to elevate hsTnI results.  Refer to the "Links" section for chest pain algorithms and additional  guidance. Performed at [facility], [place] [address], [place], [place] [address]    Troponin I (High Sensitivity) 01/23/2022 5  <18 ng/L Final   Comment: (NOTE) Elevated high sensitivity  troponin I (hsTnI) values and significant  changes across serial measurements may suggest ACS but many other  chronic and acute conditions are known to elevate hsTnI results.  Refer to the "Links" section for chest pain algorithms and additional  guidance. Performed at [facility], [place] [address] [place], [place] [address]    SARS Coronavirus 2 by RT PCR 01/23/2022 NEGATIVE  NEGATIVE Final   Comment: (NOTE) SARS-CoV-2 target nucleic acids are NOT DETECTED.  The SARS-CoV-2 RNA is generally detectable in upper and lower respiratory specimens during the acute phase of infection. The lowest concentration of SARS-CoV-2 viral copies this assay can detect is 250 copies / mL. A negative result does not preclude SARS-CoV-2 infection and should not be used as the sole basis for treatment or other patient management decisions.  A negative result may occur with improper specimen collection / handling, submission of specimen other than nasopharyngeal swab, presence of viral mutation(s) within the areas targeted by this assay, and inadequate number of viral copies (<250 copies / mL). A negative result must be combined with clinical observations, patient history, and epidemiological information.  Fact Sheet for Patients:   https://www.patel.info/  Fact Sheet for Healthcare Providers: https://hall.com/  This test is not yet approved or                           cleared by the [place] FDA and has been authorized for detection and/or diagnosis of SARS-CoV-2 by FDA under an Emergency Use Authorization (EUA).  This EUA will remain in effect (meaning this test can be used) for the duration of the COVID-19 declaration under Section 564(b)(1) of the Act, 21 U.S.C. section 360bbb-3(b)(1), unless the authorization is terminated or revoked sooner.  Performed at [facility], [place] [address] [place], [place] [address]    B Natriuretic  Peptide 01/23/2022 41.3  0.0 - 100.0 pg/mL Final   Performed at [facility] [address], [place], [place] [address]   Group A Strep by PCR 01/23/2022 NOT DETECTED  NOT DETECTED Final   Performed at [facility], [place] [address], [place], [place] [address]   Color, Urine 01/23/2022 YELLOW  YELLOW Final   APPearance 01/23/2022 CLEAR  CLEAR Final   Specific Gravity, Urine 01/23/2022 1.018  1.005 - 1.030 Final   pH 01/23/2022 7.0  5.0 - 8.0 Final   Glucose, UA 01/23/2022 NEGATIVE  NEGATIVE mg/dL Final   Hgb urine dipstick 01/23/2022 NEGATIVE  NEGATIVE Final   Bilirubin Urine 01/23/2022 NEGATIVE  NEGATIVE Final   Ketones, ur 01/23/2022 NEGATIVE  NEGATIVE mg/dL Final   Protein, ur 01/23/2022 NEGATIVE  NEGATIVE mg/dL Final   Nitrite 01/23/2022 NEGATIVE  NEGATIVE Final   Leukocytes,Ua 01/23/2022 TRACE (A)  NEGATIVE Final   RBC / HPF 01/23/2022 0-5  0 - 5 RBC/hpf Final   WBC, UA 01/23/2022 0-5  0 - 5 WBC/hpf Final   Bacteria, UA 01/23/2022 NONE SEEN  NONE SEEN Final   Squamous Epithelial / LPF 01/23/2022 0-5  0 - 5 Final   Mucus 01/23/2022 PRESENT   Final   Performed at [facility], [place] [address] [place], [place] [address]   SARS Coronavirus 2 by RT PCR 01/23/2022 NEGATIVE  NEGATIVE Final   Comment: (NOTE) SARS-CoV-2 target nucleic acids are NOT DETECTED.  The SARS-CoV-2 RNA is generally detectable in upper respiratory specimens during the acute phase of infection. The lowest concentration of SARS-CoV-2 viral copies this assay can detect is 138 copies/mL. A negative result does not preclude SARS-Cov-2 infection and should not be used as the sole basis for treatment or other patient management decisions. A negative result may occur with  improper specimen collection/handling, submission of specimen other than nasopharyngeal swab, presence of viral mutation(s) within the areas targeted by this assay, and inadequate number of viral copies(<138 copies/mL). A negative  result must be combined with clinical observations, patient history, and epidemiological information. The expected result is Negative.  Fact Sheet for Patients:  EntrepreneurPulse.com.au  Fact Sheet for Healthcare Providers:  IncredibleEmployment.be  This test is no                          t yet approved or cleared by the  [place] Architectural technologist and  has been authorized for detection and/or diagnosis of SARS-CoV-2 by FDA under an Print production planner (EUA). This EUA will remain  in effect (meaning this test can be used) for the duration of the COVID-19 declaration under Section 564(b)(1) of the Act, 21 U.S.C.section 360bbb-3(b)(1), unless the authorization is terminated  or revoked sooner.       Influenza A by PCR 01/23/2022 NEGATIVE  NEGATIVE Final   Influenza B by PCR 01/23/2022 NEGATIVE  NEGATIVE Final   Comment: (NOTE) The Xpert Xpress SARS-CoV-2/FLU/RSV plus assay is intended as an aid in the diagnosis of influenza from Nasopharyngeal swab specimens and should not be used as a sole basis for treatment. Nasal washings and aspirates are unacceptable for Xpert Xpress SARS-CoV-2/FLU/RSV testing.  Fact Sheet for Patients: EntrepreneurPulse.com.au  Fact Sheet for Healthcare Providers: IncredibleEmployment.be  This test is not yet approved or cleared by the [place] FDA and has been authorized for detection and/or diagnosis of SARS-CoV-2 by FDA under an Emergency Use Authorization (EUA). This EUA will remain in effect (meaning this test can be used) for the duration of the COVID-19 declaration under Section 564(b)(1) of the Act, 21 U.S.C. section 360bbb-3(b)(1), unless the authorization is terminated or revoked.  Performed at [facility], [place] [address] [place], [place] [address]    WBC 01/24/2022 6.1  4.0 - 10.5 K/uL Final   RBC 01/24/2022 4.60  3.87 - 5.11 MIL/uL Final   Hemoglobin  01/24/2022 11.7 (L)  12.0 - 15.0 g/dL Final   HCT 01/24/2022 37.0  36.0 - 46.0 % Final   MCV 01/24/2022 80.4  80.0 - 100.0 fL Final   MCH 01/24/2022 25.4 (L)  26.0 - 34.0 pg Final   MCHC 01/24/2022 31.6  30.0 - 36.0 g/dL Final   RDW 01/24/2022 17.6 (H)  11.5 - 15.5 % Final   Platelets 01/24/2022 334  150 - 400 K/uL Final   nRBC 01/24/2022 0.0  0.0 - 0.2 % Final   Neutrophils Relative % 01/24/2022 53  % Final   Neutro Abs 01/24/2022 3.3  1.7 - 7.7 K/uL Final   Lymphocytes Relative 01/24/2022 33  % Final   Lymphs Abs 01/24/2022 2.0  0.7 - 4.0 K/uL Final   Monocytes Relative 01/24/2022 11  % Final   Monocytes Absolute 01/24/2022 0.7  0.1 - 1.0 K/uL Final   Eosinophils Relative 01/24/2022 2  % Final   Eosinophils Absolute 01/24/2022 0.1  0.0 - 0.5 K/uL Final   Basophils Relative 01/24/2022 1  % Final   Basophils Absolute 01/24/2022 0.1  0.0 - 0.1 K/uL Final   Immature Granulocytes 01/24/2022 0  % Final   Abs Immature Granulocytes 01/24/2022 0.02  0.00 - 0.07 K/uL Final   Performed at [facility], [place] [address], [place], [place] [address]   Glucose-Capillary 01/24/2022 110 (H)  70 - 99 mg/dL Final   Glucose reference range applies only to samples taken after fasting for at least 8 hours.  No results displayed because visit has over 200 results.    Admission on 11/22/2021, Discharged on 11/22/2021  Component Date Value Ref Range Status   Glucose-Capillary 11/22/2021 169 (H)  70 - 99 mg/dL Final   Glucose reference range applies only to samples taken after fasting for at least 8 hours.  No results displayed because visit has over 200 results.    Admission on 10/04/2021, Discharged on 10/22/2021  Component Date Value Ref Range Status   SARS Coronavirus 2  by RT PCR 10/04/2021 NEGATIVE  NEGATIVE Final   Comment: (NOTE) SARS-CoV-2 target nucleic acids are NOT DETECTED.  The SARS-CoV-2 RNA is generally detectable in upper respiratory specimens during the acute phase of infection.  The lowest concentration of SARS-CoV-2 viral copies this assay can detect is 138 copies/mL. A negative result does not preclude SARS-Cov-2 infection and should not be used as the sole basis for treatment or other patient management decisions. A negative result may occur with  improper specimen collection/handling, submission of specimen other than nasopharyngeal swab, presence of viral mutation(s) within the areas targeted by this assay, and inadequate number of viral copies(<138 copies/mL). A negative result must be combined with clinical observations, patient history, and epidemiological information. The expected result is Negative.  Fact Sheet for Patients:  EntrepreneurPulse.com.au  Fact Sheet for Healthcare Providers:  IncredibleEmployment.be  This test is no                          t yet approved or cleared by the [place] FDA and  has been authorized for detection and/or diagnosis of SARS-CoV-2 by FDA under an Emergency Use Authorization (EUA). This EUA will remain  in effect (meaning this test can be used) for the duration of the COVID-19 declaration under Section 564(b)(1) of the Act, 21 U.S.C.section 360bbb-3(b)(1), unless the authorization is terminated  or revoked sooner.       Influenza A by PCR 10/04/2021 NEGATIVE  NEGATIVE Final   Influenza B by PCR 10/04/2021 NEGATIVE  NEGATIVE Final   Comment: (NOTE) The Xpert Xpress SARS-CoV-2/FLU/RSV plus assay is intended as an aid in the diagnosis of influenza from Nasopharyngeal swab specimens and should not be used as a sole basis for treatment. Nasal washings and aspirates are unacceptable for Xpert Xpress SARS-CoV-2/FLU/RSV testing.  Fact Sheet for Patients: EntrepreneurPulse.com.au  Fact Sheet for Healthcare Providers: IncredibleEmployment.be  This test is not yet approved or cleared by the [place] FDA and has been authorized for  detection and/or diagnosis of SARS-CoV-2 by FDA under an Emergency Use Authorization (EUA). This EUA will remain in effect (meaning this test can be used) for the duration of the COVID-19 declaration under Section 564(b)(1) of the Act, 21 U.S.C. section 360bbb-3(b)(1), unless the authorization is terminated or revoked.  Performed at [facility], [place] [address] [place], [place] [address]    WBC 10/04/2021 8.4  4.0 - 10.5 K/uL Final   RBC 10/04/2021 4.42  3.87 - 5.11 MIL/uL Final   Hemoglobin 10/04/2021 11.6 (L)  12.0 - 15.0 g/dL Final   HCT 10/04/2021 35.7 (L)  36.0 - 46.0 % Final   MCV 10/04/2021 80.8  80.0 - 100.0 fL Final   MCH 10/04/2021 26.2  26.0 - 34.0 pg Final   MCHC 10/04/2021 32.5  30.0 - 36.0 g/dL Final   RDW 10/04/2021 16.0 (H)  11.5 - 15.5 % Final   Platelets 10/04/2021 372  150 - 400 K/uL Final   nRBC 10/04/2021 0.0  0.0 - 0.2 % Final   Neutrophils Relative % 10/04/2021 68  % Final   Neutro Abs 10/04/2021 5.7  1.7 - 7.7 K/uL Final   Lymphocytes Relative 10/04/2021 22  % Final   Lymphs Abs 10/04/2021 1.8  0.7 - 4.0 K/uL Final   Monocytes Relative 10/04/2021 8  % Final   Monocytes Absolute 10/04/2021 0.7  0.1 - 1.0 K/uL Final   Eosinophils Relative 10/04/2021 1  % Final   Eosinophils Absolute  10/04/2021 0.1  0.0 - 0.5 K/uL Final   Basophils Relative 10/04/2021 1  % Final   Basophils Absolute 10/04/2021 0.1  0.0 - 0.1 K/uL Final   Immature Granulocytes 10/04/2021 0  % Final   Abs Immature Granulocytes 10/04/2021 0.03  0.00 - 0.07 K/uL Final   Performed at [facility], [place] [address], [place], [place] [address]   Sodium 10/04/2021 136  135 - 145 mmol/L Final   Potassium 10/04/2021 4.2  3.5 - 5.1 mmol/L Final   Chloride 10/04/2021 104  98 - 111 mmol/L Final   CO2 10/04/2021 25  22 - 32 mmol/L Final   Glucose, Bld 10/04/2021 100 (H)  70 - 99 mg/dL Final   Glucose reference range applies only to samples taken after fasting for at least 8 hours.   BUN  10/04/2021 9  6 - 20 mg/dL Final   Creatinine, Ser 10/04/2021 0.52  0.44 - 1.00 mg/dL Final   Calcium 10/04/2021 9.0  8.9 - 10.3 mg/dL Final   Total Protein 10/04/2021 7.0  6.5 - 8.1 g/dL Final   Albumin 10/04/2021 3.2 (L)  3.5 - 5.0 g/dL Final   AST 10/04/2021 13 (L)  15 - 41 U/L Final   ALT 10/04/2021 10  0 - 44 U/L Final   Alkaline Phosphatase 10/04/2021 61  38 - 126 U/L Final   Total Bilirubin 10/04/2021 0.3  0.3 - 1.2 mg/dL Final   GFR, Estimated 10/04/2021 >60  >60 mL/min Final   Comment: (NOTE) Calculated using the CKD-EPI Creatinine Equation (2021)    Anion gap 10/04/2021 7  5 - 15 Final   Performed at [facility] [address], [place], [place] [address]   Hgb A1c MFr Bld 10/04/2021 7.0 (H)  4.8 - 5.6 % Final   Comment: (NOTE) Pre diabetes:          5.7%-6.4%  Diabetes:              >6.4%  Glycemic control for   <7.0% adults with diabetes    Mean Plasma Glucose 10/04/2021 154.2  mg/dL Final   Performed at [facility], [place] [address] [place], [place] [address]   Cholesterol 10/04/2021 178  0 - 200 mg/dL Final   Triglycerides 10/04/2021 155 (H)  <150 mg/dL Final   HDL 10/04/2021 52  >40 mg/dL Final   Total CHOL/HDL Ratio 10/04/2021 3.4  RATIO Final   VLDL 10/04/2021 31  0 - 40 mg/dL Final   LDL Cholesterol 10/04/2021 95  0 - 99 mg/dL Final   Comment:        Total Cholesterol/HDL:CHD Risk Coronary Heart Disease Risk Table                     Men   Women  1/2 Average Risk   3.4   3.3  Average Risk       5.0   4.4  2 X Average Risk   9.6   7.1  3 X Average Risk  23.4   11.0        Use the calculated Patient Ratio above and the CHD Risk Table to determine the patient's CHD Risk.        ATP III CLASSIFICATION (LDL):  <100     mg/dL   Optimal  100-129  mg/dL   Near or Above                    Optimal  130-159  mg/dL   Borderline  160-189  mg/dL   High  >190     mg/dL   Very High Performed at [facility] [address], [place], [place]  [address]    POC Amphetamine UR 10/04/2021 None Detected  NONE DETECTED (Cut Off Level 1000 ng/mL) Final   POC Secobarbital (BAR) 10/04/2021 None Detected  NONE DETECTED (Cut Off Level 300 ng/mL) Final   POC Buprenorphine (BUP) 10/04/2021 None Detected  NONE DETECTED (Cut Off Level 10 ng/mL) Final   POC Oxazepam (BZO) 10/04/2021 None Detected  NONE DETECTED (Cut Off Level 300 ng/mL) Final   POC Cocaine UR 10/04/2021 None Detected  NONE DETECTED (Cut Off Level 300 ng/mL) Final   POC Methamphetamine UR 10/04/2021 None Detected  NONE DETECTED (Cut Off Level 1000 ng/mL) Final   POC Morphine 10/04/2021 None Detected  NONE DETECTED (Cut Off Level 300 ng/mL) Final   POC Methadone UR 10/04/2021 None Detected  NONE DETECTED (Cut Off Level 300 ng/mL) Final   POC Oxycodone UR 10/04/2021 Positive (A)  NONE DETECTED (Cut Off Level 100 ng/mL) Final   POC Marijuana UR 10/04/2021 None Detected  NONE DETECTED (Cut Off Level 50 ng/mL) Final   SARSCOV2ONAVIRUS 2 AG 10/04/2021 NEGATIVE  NEGATIVE Final   Comment: (NOTE) SARS-CoV-2 antigen NOT DETECTED.   Negative results are presumptive.  Negative results do not preclude SARS-CoV-2 infection and should not be used as the sole basis for treatment or other patient management decisions, including infection  control decisions, particularly in the presence of clinical signs and  symptoms consistent with COVID-19, or in those who have been in contact with the virus.  Negative results must be combined with clinical observations, patient history, and epidemiological information. The expected result is Negative.  Fact Sheet for Patients: HandmadeRecipes.com.cy  Fact Sheet for Healthcare Providers: FuneralLife.at  This test is not yet approved or cleared by the [place] FDA and  has been authorized for detection and/or diagnosis of SARS-CoV-2 by FDA under an Emergency Use Authorization (EUA).  This EUA will remain in  effect (meaning this test can be used) for the duration of  the COV                          ID-19 declaration under Section 564(b)(1) of the Act, 21 U.S.C. section 360bbb-3(b)(1), unless the authorization is terminated or revoked sooner.     Valproic Acid Lvl 10/04/2021 51  50.0 - 100.0 ug/mL Final   Performed at [facility] [address], [place], [place] [address]   Valproic Acid Lvl 10/08/2021 57  50.0 - 100.0 ug/mL Final   Performed at [facility] [address], [place], [place] [address]   Glucose-Capillary 10/09/2021 123 (H)  70 - 99 mg/dL Final   Glucose reference range applies only to samples taken after fasting for at least 8 hours.  There may be more visits with results that are not included.    Blood Alcohol level:  Lab Results  Component Value Date   [facility] <10 11/23/2021   ETH <10 48/54/6270    Metabolic Disorder Labs: Lab Results  Component Value Date   HGBA1C 6.7 (H) 12/28/2021   MPG 145.59 12/28/2021   MPG 154.2 10/04/2021   Lab Results  Component Value Date   PROLACTIN 3.8 (L) 11/23/2021   Lab Results  Component Value Date   CHOL 171 11/23/2021   TRIG 125 11/23/2021  HDL 65 11/23/2021   CHOLHDL 2.6 11/23/2021   VLDL 25 11/23/2021   LDLCALC 81 11/23/2021   LDLCALC 95 10/04/2021    Therapeutic Lab Levels: No results found for: "LITHIUM" Lab Results  Component Value Date   VALPROATE 17 (L) 02/17/2022   VALPROATE 33 (L) 01/18/2022   No results found for: "CBMZ"  Physical Findings   PHQ2-9    Flowsheet Row ED [facility]  PHQ-2 Total Score 2  PHQ-9 Total Score 3      Flowsheet Row ED from 02/18/2022 in [facility] ED from 02/17/2022 in [facility] ED from 02/09/2022 in [facility] Error: Question 2 not populated No Risk No Risk       Psychiatric Specialty  Exam  Presentation  General Appearance:  Appropriate for Environment; Casual  Eye Contact: Good  Speech: Clear and Coherent; Normal Rate  Speech Volume: Normal  Handedness: Right   Mood and Affect  Mood: Euthymic (bored)  Affect: Appropriate; Congruent; Full Range   Thought Process  Thought Processes: Coherent; Goal Directed; Linear  Descriptions of Associations:Intact  Orientation:Full (Time, Place and Person)  Thought Content:WDL  Diagnosis of Schizophrenia or Schizoaffective disorder in past: Yes  Duration of Psychotic Symptoms: NA  Hallucinations:Hallucinations: None   Ideas of Reference:None  Suicidal Thoughts:Suicidal Thoughts: No   Homicidal Thoughts:Homicidal Thoughts: No    Sensorium  Memory: Immediate Good  Judgment: Fair  Insight: Fair   Community education officer  Concentration: Good  Attention Span: Good  Recall: Good  Fund of Knowledge: Good  Language: Good   Psychomotor Activity  Psychomotor Activity: Psychomotor Activity: Normal (AIMS 0)    Assets  Assets: Communication Skills; Desire for Improvement   Sleep  Sleep: Sleep: Good    Physical Exam  Physical Exam Vitals and nursing note reviewed.  Constitutional:      General: She is not in acute distress.    Appearance: She is not ill-appearing, toxic-appearing or diaphoretic.  HENT:     Head: Normocephalic and atraumatic.  Pulmonary:     Effort: Pulmonary effort is normal.     Breath sounds: Normal breath sounds.  Musculoskeletal:     Cervical back: Normal range of motion.     Right lower leg: No edema.     Left lower leg: No edema.  Skin:    General: Skin is dry.  Neurological:     Mental Status: She is alert and oriented to person, place, and time.    Blood pressure 123/61, pulse 90, temperature 98.5 F (36.9 C), temperature source Oral, resp. rate 18, SpO2 98 %. There is no height or weight on file to calculate BMI.  Plan:  Coordination of  Care with TOC /SW and DSS to secure placement for patient Patient has been psychiatrically cleared and does not meet criteria for inpatient psychiatric treatment.  Disposition is pending possible placement at [facility].    HLD The 10-year ASCVD risk score (Arnett DK, et al., 2019) is: 12.7%   Values used to calculate the score:     Age: 62 years     Sex: Female     Is Non-Hispanic African American: No     Diabetic: Yes     Tobacco smoker: Yes     Systolic Blood Pressure: 681 mmHg     Is BP treated: Yes     HDL Cholesterol: 65 mg/dL  Total Cholesterol: 171 mg/dL  Risk >7.5%, meeting requirement for high intensity statin. She was on a statin in the past per chart review (simvastatin and pravastatin), unclear why patient is no longer on statin.  Patient denied history of muscle aches or pain or intolerance to statin Starting crestor 20 mg qHS   Routine monitoring Ordered CBC with differential for routine Clozaril monitoring  [name], DO-PGY2 03/02/2022 9:27 AM

## 2022-03-02 NOTE — ED Notes (Signed)
Patient observed/assessed in bed/chair resting quietly appearing in no distress and verbalizing no complaints at this time. Will continue to monitor.  

## 2022-03-02 NOTE — ED Notes (Signed)
Pt sleeping in recliner bed. Resp even and unlabored. Will continue to monitor for safety 

## 2022-03-02 NOTE — ED Notes (Signed)
Pt had bedtime snack. 

## 2022-03-02 NOTE — ED Notes (Signed)
Pt A&O x 4, no distress noted. Calm & cooperative. Monitoring for safety.

## 2022-03-02 NOTE — ED Notes (Signed)
Blood was drawn for cbc w/diff.STAT lab courier called to transport labs to [facility] lab

## 2022-03-03 DIAGNOSIS — F209 Schizophrenia, unspecified: Secondary | ICD-10-CM | POA: Diagnosis not present

## 2022-03-03 LAB — GLUCOSE, CAPILLARY
Glucose-Capillary: 128 mg/dL — ABNORMAL HIGH (ref 70–99)
Glucose-Capillary: 134 mg/dL — ABNORMAL HIGH (ref 70–99)
Glucose-Capillary: 100 mg/dL — ABNORMAL HIGH (ref 70–99)
Glucose-Capillary: 160 mg/dL — ABNORMAL HIGH (ref 70–99)

## 2022-03-03 MED ORDER — TAB-A-VITE/IRON PO TABS
1.0000 | ORAL_TABLET | Freq: Every day | ORAL | Status: DC
  Administered 2022-03-03 – 2022-03-14 (×12): 1 via ORAL
  Filled 2022-03-03 (×12): qty 1

## 2022-03-03 NOTE — ED Notes (Signed)
Pt sitting on her bed calm and cooperative. No c/o pain or distress. Pt talking with another pt on unit. Will continue to monitor for safety

## 2022-03-03 NOTE — ED Notes (Signed)
Pt was given cereal and muffin for breakfast.

## 2022-03-03 NOTE — ED Provider Notes (Signed)
Behavioral Health Progress Note  Date and Time: 03/03/2022 10:33 AM Name: Teresa Murillo MRN:  [id number]    Diagnosis:  Final diagnoses:  Schizophrenia, unspecified type (Teresa Murillo)    Total Time spent with patient: 15 minutes  Brief HPI: Teresa Murillo is a 62 y.o. female with PMH of schizophrenia, borderline intellectual functioning, tobacco use d/o, HTN, HLD, DM2 (insulin-dependent), atrial flutter, COPD, admitted to  [facility] on 11/23/21 voluntarily, accompanied by [employer], and reported that she was locked out of her group home. It was later confirmed that Teresa Murillo was dismissed from group home due to recurrent history of eloping from the group home. Patient was evaluated here subsequently discharged from [facility], however has remained at this facility as a boarder while DSS continues efforts to find permanent placement for Teresa Murillo.   Brief summary of cognitive evaluations completed during stay here at Teresa Murillo per chart review:  (AID to Capacity Evaluation (ACE) completed by Dr. Louis Meckel, Dr. Dwyane Dee on 12/11/2021.  Please see media tab for full details.    [name], PhD completed: Wechsler Adult Intelligence Scale-4, Teresa Murillo achieved a full-scale IQ score of 73 and a percentile rank of 4 placing her in the borderline range of intellectual functioning (12/14/2021) Please see consult note from [name], PhD on 12/14/2021.   Patient continues to be psychiatrically cleared and has no apparent acute psychiatric crisis.  Patient does not meet inpatient psychiatric treatment criteria.  Current plan as of 02/28/2022: Disposition is pending placement, possible placement at [facility].  Subjective:   Patient was initially seen asleep comfortable, no acute distress. Patient was pleasant, calm, and cooperative with evaluation.  Patient stated she is feeling nervous and there is a meeting for her today.  Otherwise she has no other concerns.  Reported her mood as "fine".  Reported her  sleep and appetite are appropriate and stable.  Patient denied medication side effects, tolerating the statin well. She denied any somatic concerns per ROS below. She denied SI/HI/AVH, paranoia, contracted to safety. She had no other questions and was amenable to plan per below.  Review of Systems  Constitutional:  Negative for malaise/fatigue.  Respiratory:  Negative for shortness of breath.   Cardiovascular:  Negative for chest pain and leg swelling.  Gastrointestinal:  Negative for abdominal pain, constipation, diarrhea, nausea and vomiting.  Musculoskeletal:  Negative for back pain and myalgias.  Neurological:  Negative for dizziness, tremors and headaches.     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 On the Wechsler Adult Intelligence Scale-4, Ms. [name] achieved a full-scale IQ score of 73 and a percentile rank of 4 placing her in the borderline range of intellectual functioning.    Chronic obstructive pulmonary disease (COPD) (HCC)    Glaucoma    Hyperlipidemia    Hypertension    Iron deficiency    Schizoaffective disorder (HCC)    Type 2 diabetes mellitus (HCC)     Past Surgical History:  Procedure Laterality Date   TUBAL LIGATION     Family History:  Family History  Problem Relation Age of Onset   Breast cancer Maternal Grandmother     Social History:  Social History   Substance and Sexual Activity  Alcohol Use Not Currently     Social History   Substance and Sexual Activity  Drug Use Not Currently    Social History   Socioeconomic  History   Marital status: Divorced    Spouse name: Not on file   Number of children: Not on file   Years of education: Not on file   Highest education level: Not on file  Occupational History   Not on file  Tobacco Use   Smoking status: Former    Packs/day: 1.00    Types: Cigars,  Cigarettes    Quit date: 11/09/2021    Years since quitting: 0.3   Smokeless tobacco: Current  Vaping Use   Vaping Use: Never used  Substance and Sexual Activity   Alcohol use: Not Currently   Drug use: Not Currently   Sexual activity: Not Currently    Birth control/protection: Surgical  Other Topics Concern   Not on file  Social History Narrative   Not on file   Social Determinants of Health   Financial Resource Strain: Not on file  Food Insecurity: Not on file  Transportation Needs: Not on file  Physical Activity: Not on file  Stress: Not on file  Social Connections: Not on file   SDOH:  SDOH Screenings   Depression (PHQ2-9): Low Risk  (11/23/2021)  Tobacco Use: High Risk (02/17/2022)  Current Medications:  Current Facility-Administered Medications  Medication Dose Route Frequency Provider Last Rate Last Admin   acetaminophen (TYLENOL) tablet 650 mg  650 mg Oral Q6H PRN Teresa Georges, NP   650 mg at 03/02/22 2225   albuterol (VENTOLIN HFA) 108 (90 Base) MCG/ACT inhaler 2 puff  2 puff Inhalation Q6H PRN Teresa Georges, NP   2 puff at 02/20/22 1944   alum & mag hydroxide-simeth (MAALOX/MYLANTA) 200-200-20 MG/5ML suspension 30 mL  30 mL Oral Q4H PRN Teresa Georges, NP   30 mL at 02/28/22 0934   apixaban (ELIQUIS) tablet 5 mg  5 mg Oral BID Teresa Georges, NP   5 mg at 03/03/22 0912   ARIPiprazole (ABILIFY) tablet 10 mg  10 mg Oral Daily Teresa Georges, NP   10 mg at 03/03/22 0912   cloZAPine (CLOZARIL) tablet 75 mg  75 mg Oral BID Teresa Ravens, MD   75 mg at 03/03/22 0347   colchicine tablet 0.6 mg  0.6 mg Oral QHS Teresa Georges, NP   0.6 mg at 03/02/22 2226   diltiazem (CARDIZEM CD) 24 hr capsule 240 mg  240 mg Oral Daily Teresa Georges, NP   240 mg at 03/03/22 0912   divalproex (DEPAKOTE ER) 24 hr tablet 500 mg  500 mg Oral BID Teresa Georges, NP   500 mg at 03/03/22 0912   haloperidol (HALDOL) tablet 10 mg  10 mg Oral TID PRN Teresa Georges, NP       insulin aspart (novoLOG)  injection 0-9 Units  0-9 Units Subcutaneous TID WC Teresa Ravens, MD   1 Units at 03/03/22 0917   insulin glargine-yfgn (SEMGLEE) injection 12 Units  12 Units Subcutaneous Daily Teresa Georges, NP   12 Units at 03/03/22 0918   latanoprost (XALATAN) 0.005 % ophthalmic solution 1 drop  1 drop Both Eyes QHS Teresa Georges, NP   1 drop at 03/02/22 2227   magnesium hydroxide (MILK OF MAGNESIA) suspension 30 mL  30 mL Oral Daily PRN Teresa Georges, NP       metFORMIN (GLUCOPHAGE) tablet 500 mg  500 mg Oral BID WC Teresa Georges, NP   500 mg at 03/03/22 0912   mometasone-formoterol (DULERA) 200-5 MCG/ACT inhaler 2 puff  2 puff Inhalation BID Teresa Georges, NP   2 puff at 03/03/22 [phone]  nicotine (NICODERM CQ - dosed in mg/24 hr) patch 7 mg  7 mg Transdermal Daily Teresa Georges, NP   7 mg at 03/03/22 0914   rosuvastatin (CRESTOR) tablet 20 mg  20 mg Oral QHS [name], DO   20 mg at 03/02/22 2227   valbenazine (INGREZZA) capsule 40 mg  40 mg Oral q AM Teresa Georges, NP   40 mg at 03/03/22 8811   Vitamin D (Ergocalciferol) (DRISDOL) 1.25 MG (50000 UNIT) capsule 50,000 Units  50,000 Units Oral Q Leonie Green, NP   50,000 Units at 02/25/22 0315   Current Outpatient Medications  Medication Sig Dispense Refill   Accu-Chek Softclix Lancets lancets Use as directed up to four times daily 100 each 0   apixaban (ELIQUIS) 5 MG TABS tablet Take 1 tablet (5 mg total) by mouth 2 (two) times daily. 60 tablet 0   ARIPiprazole (ABILIFY) 10 MG tablet Take 1 tablet (10 mg total) by mouth daily. 30 tablet 0   Blood Glucose Monitoring Suppl (ACCU-CHEK GUIDE) w/Device KIT Use as directed up to four times daily 1 kit 0   budesonide-formoterol (SYMBICORT) 160-4.5 MCG/ACT inhaler Inhale 2 puffs into the lungs in the morning and at bedtime.     Cholecalciferol (VITAMIN D3) 1.25 MG (50000 UT) CAPS Take 50,000 Units by mouth every Thursday.     clozapine (CLOZARIL) 50 MG tablet Take 1 tablet (50 mg total) by mouth daily. 30 tablet  0   colchicine 0.6 MG tablet Take 1 tablet (0.6 mg total) by mouth at bedtime. 30 tablet 0   diltiazem (CARDIZEM CD) 240 MG 24 hr capsule Take 1 capsule (240 mg total) by mouth daily. (Patient not taking: Reported on 11/24/2021) 30 capsule 0   divalproex (DEPAKOTE ER) 500 MG 24 hr tablet Take 1 tablet (500 mg total) by mouth 2 (two) times daily. 60 tablet 0   glucose blood test strip Use as directed up to four times daily 50 each 0   haloperidol (HALDOL) 10 MG tablet Take 1 tablet (10 mg total) by mouth 3 (three) times daily as needed for agitation (and psychotic symptoms).     INGREZZA 40 MG capsule Take 1 capsule (40 mg total) by mouth in the morning. 30 capsule 0   insulin aspart (NOVOLOG) 100 UNIT/ML FlexPen Before each meal 3 times a day, 140-199 - 2 units, 200-250 - 4 units, 251-299 - 6 units,  300-349 - 8 units,  350 or above 10 units. Insulin PEN if approved, provide syringes and needles if needed.Please switch to any approved short acting Insulin if needed. 15 mL 0   insulin glargine (LANTUS) 100 UNIT/ML Solostar Pen Inject 12 Units into the skin daily. 15 mL 0   Insulin Pen Needle 32G X 4 MM MISC Use 4 times a day with insulin, 1 month supply. 100 each 0   latanoprost (XALATAN) 0.005 % ophthalmic solution Place 1 drop into both eyes at bedtime.     metFORMIN (GLUCOPHAGE) 500 MG tablet Take 500 mg by mouth 2 (two) times daily with a meal.     nicotine (NICODERM CQ - DOSED IN MG/24 HR) 7 mg/24hr patch Place 1 patch (7 mg total) onto the skin daily. 28 patch 0   PROAIR HFA 108 (90 Base) MCG/ACT inhaler Inhale 2 puffs into the lungs every 6 (six) hours as needed for wheezing or shortness of breath.      Labs  Lab Results:  Admission on 02/18/2022  Component Date Value Ref Range  Status   Glucose-Capillary 02/18/2022 254 (H)  70 - 99 mg/dL Final   Glucose reference range applies only to samples taken after fasting for at least 8 hours.   Glucose-Capillary 02/18/2022 112 (H)  70 - 99 mg/dL  Final   Glucose reference range applies only to samples taken after fasting for at least 8 hours.   Glucose-Capillary 02/19/2022 159 (H)  70 - 99 mg/dL Final   Glucose reference range applies only to samples taken after fasting for at least 8 hours.   Glucose-Capillary 02/19/2022 160 (H)  70 - 99 mg/dL Final   Glucose reference range applies only to samples taken after fasting for at least 8 hours.   Glucose-Capillary 02/19/2022 177 (H)  70 - 99 mg/dL Final   Glucose reference range applies only to samples taken after fasting for at least 8 hours.   Glucose-Capillary 02/19/2022 147 (H)  70 - 99 mg/dL Final   Glucose reference range applies only to samples taken after fasting for at least 8 hours.   Glucose-Capillary 02/20/2022 126 (H)  70 - 99 mg/dL Final   Glucose reference range applies only to samples taken after fasting for at least 8 hours.   Glucose-Capillary 02/20/2022 132 (H)  70 - 99 mg/dL Final   Glucose reference range applies only to samples taken after fasting for at least 8 hours.   Glucose-Capillary 02/20/2022 125 (H)  70 - 99 mg/dL Final   Glucose reference range applies only to samples taken after fasting for at least 8 hours.   Glucose-Capillary 02/21/2022 151 (H)  70 - 99 mg/dL Final   Glucose reference range applies only to samples taken after fasting for at least 8 hours.   Glucose-Capillary 02/21/2022 106 (H)  70 - 99 mg/dL Final   Glucose reference range applies only to samples taken after fasting for at least 8 hours.   Glucose-Capillary 02/21/2022 149 (H)  70 - 99 mg/dL Final   Glucose reference range applies only to samples taken after fasting for at least 8 hours.   Glucose-Capillary 02/22/2022 131 (H)  70 - 99 mg/dL Final   Glucose reference range applies only to samples taken after fasting for at least 8 hours.   Glucose-Capillary 02/22/2022 141 (H)  70 - 99 mg/dL Final   Glucose reference range applies only to samples taken after fasting for at least 8 hours.    Glucose-Capillary 02/22/2022 183 (H)  70 - 99 mg/dL Final   Glucose reference range applies only to samples taken after fasting for at least 8 hours.   Glucose-Capillary 02/23/2022 133 (H)  70 - 99 mg/dL Final   Glucose reference range applies only to samples taken after fasting for at least 8 hours.   WBC 02/23/2022 4.8  4.0 - 10.5 K/uL Final   RBC 02/23/2022 3.96  3.87 - 5.11 MIL/uL Final   Hemoglobin 02/23/2022 9.7 (L)  12.0 - 15.0 g/dL Final   HCT 02/23/2022 30.6 (L)  36.0 - 46.0 % Final   MCV 02/23/2022 77.3 (L)  80.0 - 100.0 fL Final   MCH 02/23/2022 24.5 (L)  26.0 - 34.0 pg Final   MCHC 02/23/2022 31.7  30.0 - 36.0 g/dL Final   RDW 02/23/2022 16.5 (H)  11.5 - 15.5 % Final   Platelets 02/23/2022 372  150 - 400 K/uL Final   nRBC 02/23/2022 0.0  0.0 - 0.2 % Final   Neutrophils Relative % 02/23/2022 56  % Final   Neutro Abs 02/23/2022 2.7  1.7 -  7.7 K/uL Final   Lymphocytes Relative 02/23/2022 32  % Final   Lymphs Abs 02/23/2022 1.5  0.7 - 4.0 K/uL Final   Monocytes Relative 02/23/2022 9  % Final   Monocytes Absolute 02/23/2022 0.4  0.1 - 1.0 K/uL Final   Eosinophils Relative 02/23/2022 2  % Final   Eosinophils Absolute 02/23/2022 0.1  0.0 - 0.5 K/uL Final   Basophils Relative 02/23/2022 1  % Final   Basophils Absolute 02/23/2022 0.1  0.0 - 0.1 K/uL Final   Immature Granulocytes 02/23/2022 0  % Final   Abs Immature Granulocytes 02/23/2022 0.01  0.00 - 0.07 K/uL Final   Performed at [facility] [address], [place], [place] [address]   Glucose-Capillary 02/23/2022 202 (H)  70 - 99 mg/dL Final   Glucose reference range applies only to samples taken after fasting for at least 8 hours.   Glucose-Capillary 02/23/2022 143 (H)  70 - 99 mg/dL Final   Glucose reference range applies only to samples taken after fasting for at least 8 hours.   Glucose-Capillary 02/23/2022 143 (H)  70 - 99 mg/dL Final   Glucose reference range applies only to samples taken after fasting for at least  8 hours.   Glucose-Capillary 02/24/2022 207 (H)  70 - 99 mg/dL Final   Glucose reference range applies only to samples taken after fasting for at least 8 hours.   Glucose-Capillary 02/24/2022 109 (H)  70 - 99 mg/dL Final   Glucose reference range applies only to samples taken after fasting for at least 8 hours.   Glucose-Capillary 02/24/2022 127 (H)  70 - 99 mg/dL Final   Glucose reference range applies only to samples taken after fasting for at least 8 hours.   Glucose-Capillary 02/24/2022 130 (H)  70 - 99 mg/dL Final   Glucose reference range applies only to samples taken after fasting for at least 8 hours.   Glucose-Capillary 02/25/2022 117 (H)  70 - 99 mg/dL Final   Glucose reference range applies only to samples taken after fasting for at least 8 hours.   Glucose-Capillary 02/25/2022 104 (H)  70 - 99 mg/dL Final   Glucose reference range applies only to samples taken after fasting for at least 8 hours.   Glucose-Capillary 02/25/2022 180 (H)  70 - 99 mg/dL Final   Glucose reference range applies only to samples taken after fasting for at least 8 hours.   Glucose-Capillary 02/25/2022 180 (H)  70 - 99 mg/dL Final   Glucose reference range applies only to samples taken after fasting for at least 8 hours.   Glucose-Capillary 02/26/2022 133 (H)  70 - 99 mg/dL Final   Glucose reference range applies only to samples taken after fasting for at least 8 hours.   Glucose-Capillary 02/26/2022 85  70 - 99 mg/dL Final   Glucose reference range applies only to samples taken after fasting for at least 8 hours.   Glucose-Capillary 02/26/2022 136 (H)  70 - 99 mg/dL Final   Glucose reference range applies only to samples taken after fasting for at least 8 hours.   Glucose-Capillary 02/26/2022 170 (H)  70 - 99 mg/dL Final   Glucose reference range applies only to samples taken after fasting for at least 8 hours.   Glucose-Capillary 02/26/2022 185 (H)  70 - 99 mg/dL Final   Glucose reference range applies  only to samples taken after fasting for at least 8 hours.   Glucose-Capillary 02/27/2022 124 (H)  70 - 99 mg/dL  Final   Glucose reference range applies only to samples taken after fasting for at least 8 hours.   Glucose-Capillary 02/27/2022 161 (H)  70 - 99 mg/dL Final   Glucose reference range applies only to samples taken after fasting for at least 8 hours.   Glucose-Capillary 02/27/2022 170 (H)  70 - 99 mg/dL Final   Glucose reference range applies only to samples taken after fasting for at least 8 hours.   Glucose-Capillary 02/27/2022 154 (H)  70 - 99 mg/dL Final   Glucose reference range applies only to samples taken after fasting for at least 8 hours.   Glucose-Capillary 02/28/2022 109 (H)  70 - 99 mg/dL Final   Glucose reference range applies only to samples taken after fasting for at least 8 hours.   Glucose-Capillary 02/28/2022 134 (H)  70 - 99 mg/dL Final   Glucose reference range applies only to samples taken after fasting for at least 8 hours.   Glucose-Capillary 02/28/2022 160 (H)  70 - 99 mg/dL Final   Glucose reference range applies only to samples taken after fasting for at least 8 hours.   Glucose-Capillary 02/28/2022 136 (H)  70 - 99 mg/dL Final   Glucose reference range applies only to samples taken after fasting for at least 8 hours.   Glucose-Capillary 02/28/2022 173 (H)  70 - 99 mg/dL Final   Glucose reference range applies only to samples taken after fasting for at least 8 hours.   Glucose-Capillary 03/01/2022 134 (H)  70 - 99 mg/dL Final   Glucose reference range applies only to samples taken after fasting for at least 8 hours.   Glucose-Capillary 03/01/2022 116 (H)  70 - 99 mg/dL Final   Glucose reference range applies only to samples taken after fasting for at least 8 hours.   Glucose-Capillary 03/01/2022 227 (H)  70 - 99 mg/dL Final   Glucose reference range applies only to samples taken after fasting for at least 8 hours.   Glucose-Capillary 03/01/2022 157 (H)  70  - 99 mg/dL Final   Glucose reference range applies only to samples taken after fasting for at least 8 hours.   Glucose-Capillary 03/02/2022 99  70 - 99 mg/dL Final   Glucose reference range applies only to samples taken after fasting for at least 8 hours.   WBC 03/02/2022 6.0  4.0 - 10.5 K/uL Final   RBC 03/02/2022 4.15  3.87 - 5.11 MIL/uL Final   Hemoglobin 03/02/2022 9.7 (L)  12.0 - 15.0 g/dL Final   HCT 03/02/2022 31.5 (L)  36.0 - 46.0 % Final   MCV 03/02/2022 75.9 (L)  80.0 - 100.0 fL Final   MCH 03/02/2022 23.4 (L)  26.0 - 34.0 pg Final   MCHC 03/02/2022 30.8  30.0 - 36.0 g/dL Final   RDW 03/02/2022 16.4 (H)  11.5 - 15.5 % Final   Platelets 03/02/2022 395  150 - 400 K/uL Final   nRBC 03/02/2022 0.0  0.0 - 0.2 % Final   Neutrophils Relative % 03/02/2022 61  % Final   Neutro Abs 03/02/2022 3.6  1.7 - 7.7 K/uL Final   Lymphocytes Relative 03/02/2022 29  % Final   Lymphs Abs 03/02/2022 1.7  0.7 - 4.0 K/uL Final   Monocytes Relative 03/02/2022 8  % Final   Monocytes Absolute 03/02/2022 0.5  0.1 - 1.0 K/uL Final   Eosinophils Relative 03/02/2022 1  % Final   Eosinophils Absolute 03/02/2022 0.1  0.0 - 0.5 K/uL Final   Basophils Relative 03/02/2022  1  % Final   Basophils Absolute 03/02/2022 0.1  0.0 - 0.1 K/uL Final   Immature Granulocytes 03/02/2022 0  % Final   Abs Immature Granulocytes 03/02/2022 0.01  0.00 - 0.07 K/uL Final   Performed at [facility], [place] [address], [place], [place] [address]   Glucose-Capillary 03/02/2022 171 (H)  70 - 99 mg/dL Final   Glucose reference range applies only to samples taken after fasting for at least 8 hours.   Glucose-Capillary 03/02/2022 108 (H)  70 - 99 mg/dL Final   Glucose reference range applies only to samples taken after fasting for at least 8 hours.   Glucose-Capillary 03/02/2022 203 (H)  70 - 99 mg/dL Final   Glucose reference range applies only to samples taken after fasting for at least 8 hours.   Glucose-Capillary 03/03/2022 128  (H)  70 - 99 mg/dL Final   Glucose reference range applies only to samples taken after fasting for at least 8 hours.  Admission on 02/17/2022, Discharged on 02/18/2022  Component Date Value Ref Range Status   Sodium 02/17/2022 136  135 - 145 mmol/L Final   Potassium 02/17/2022 3.9  3.5 - 5.1 mmol/L Final   Chloride 02/17/2022 101  98 - 111 mmol/L Final   CO2 02/17/2022 25  22 - 32 mmol/L Final   Glucose, Bld 02/17/2022 113 (H)  70 - 99 mg/dL Final   Glucose reference range applies only to samples taken after fasting for at least 8 hours.   BUN 02/17/2022 9  6 - 20 mg/dL Final   Creatinine, Ser 02/17/2022 0.50  0.44 - 1.00 mg/dL Final   Calcium 02/17/2022 8.9  8.9 - 10.3 mg/dL Final   GFR, Estimated 02/17/2022 >60  >60 mL/min Final   Comment: (NOTE) Calculated using the CKD-EPI Creatinine Equation (2021)    Anion gap 02/17/2022 10  5 - 15 Final   Performed at [facility], [place] [address], [place], [place] [address]   WBC 02/17/2022 6.5  4.0 - 10.5 K/uL Final   RBC 02/17/2022 4.24  3.87 - 5.11 MIL/uL Final   Hemoglobin 02/17/2022 10.2 (L)  12.0 - 15.0 g/dL Final   HCT 02/17/2022 33.2 (L)  36.0 - 46.0 % Final   MCV 02/17/2022 78.3 (L)  80.0 - 100.0 fL Final   MCH 02/17/2022 24.1 (L)  26.0 - 34.0 pg Final   MCHC 02/17/2022 30.7  30.0 - 36.0 g/dL Final   RDW 02/17/2022 17.2 (H)  11.5 - 15.5 % Final   Platelets 02/17/2022 390  150 - 400 K/uL Final   nRBC 02/17/2022 0.0  0.0 - 0.2 % Final   Performed at [facility], [place] [address] [place], [place] [address]   Troponin I (High Sensitivity) 02/17/2022 4  <18 ng/L Final   Comment: (NOTE) Elevated high sensitivity troponin I (hsTnI) values and significant  changes across serial measurements may suggest ACS but many other  chronic and acute conditions are known to elevate hsTnI results.  Refer to the "Links" section for chest pain algorithms and additional  guidance. Performed at [facility], [place] [address],  [place], [place] [address]    Valproic Acid Lvl 02/17/2022 17 (L)  50.0 - 100.0 ug/mL Final   Performed at [facility] [address], [place], [place] [address]   B Natriuretic Peptide 02/17/2022 30.9  0.0 - 100.0 pg/mL Final   Performed at [facility] [address]  [place], [place] [address]   Troponin I (High Sensitivity) 02/18/2022 4  <18 ng/L Final   Comment: (NOTE) Elevated high sensitivity troponin I (hsTnI) values and significant  changes across serial measurements may suggest ACS but many other  chronic and acute conditions are known to elevate hsTnI results.  Refer to the "Links" section for chest pain algorithms and additional  guidance. Performed at [facility], [place] [address], [place], [place] [address]    D-Dimer, Quant 02/18/2022 0.29  0.00 - 0.50 ug/mL-FEU Final   Comment: (NOTE) At the manufacturer cut-off value of 0.5 g/mL FEU, this assay has a negative predictive value of 95-100%.This assay is intended for use in conjunction with a clinical pretest probability (PTP) assessment model to exclude pulmonary embolism (PE) and deep venous thrombosis (DVT) in outpatients suspected of PE or DVT. Results should be correlated with clinical presentation. Performed at [facility], [place] [address], [place],  [address]   Admission on 02/09/2022, Discharged on 02/17/2022  Component Date Value Ref Range Status   Glucose-Capillary 02/09/2022 118 (H)  70 - 99 mg/dL Final   Glucose reference range applies only to samples taken after fasting for at least 8 hours.   Glucose-Capillary 02/10/2022 148 (H)  70 - 99 mg/dL Final   Glucose reference range applies only to samples taken after fasting for at least 8 hours.   Glucose-Capillary 02/10/2022 174 (H)  70 - 99 mg/dL Final   Glucose reference range applies only to samples taken after fasting for at least 8 hours.   Glucose-Capillary 02/10/2022 128 (H)  70 - 99 mg/dL Final   Glucose reference range  applies only to samples taken after fasting for at least 8 hours.   Glucose-Capillary 02/10/2022 182 (H)  70 - 99 mg/dL Final   Glucose reference range applies only to samples taken after fasting for at least 8 hours.   Glucose-Capillary 02/11/2022 158 (H)  70 - 99 mg/dL Final   Glucose reference range applies only to samples taken after fasting for at least 8 hours.   Glucose-Capillary 02/11/2022 159 (H)  70 - 99 mg/dL Final   Glucose reference range applies only to samples taken after fasting for at least 8 hours.   Glucose-Capillary 02/11/2022 153 (H)  70 - 99 mg/dL Final   Glucose reference range applies only to samples taken after fasting for at least 8 hours.   Glucose-Capillary 02/11/2022 159 (H)  70 - 99 mg/dL Final   Glucose reference range applies only to samples taken after fasting for at least 8 hours.   Glucose-Capillary 02/11/2022 153 (H)  70 - 99 mg/dL Final   Glucose reference range applies only to samples taken after fasting for at least 8 hours.   Glucose-Capillary 02/11/2022 109 (H)  70 - 99 mg/dL Final   Glucose reference range applies only to samples taken after fasting for at least 8 hours.   Glucose-Capillary 02/11/2022 213 (H)  70 - 99 mg/dL Final   Glucose reference range applies only to samples taken after fasting for at least 8 hours.   Glucose-Capillary 02/11/2022 229 (H)  70 - 99 mg/dL Final   Glucose reference range applies only to samples taken after fasting for at least 8 hours.   Glucose-Capillary 02/12/2022 128 (H)  70 - 99 mg/dL Final   Glucose reference range applies only to samples taken after fasting for at least 8 hours.   Glucose-Capillary 02/12/2022 149 (H)  70 - 99 mg/dL Final  Glucose reference range applies only to samples taken after fasting for at least 8 hours.   Color, Urine 02/12/2022 YELLOW  YELLOW Final   APPearance 02/12/2022 CLEAR  CLEAR Final   Specific Gravity, Urine 02/12/2022 1.015  1.005 - 1.030 Final   pH 02/12/2022 5.0  5.0 - 8.0  Final   Glucose, UA 02/12/2022 NEGATIVE  NEGATIVE mg/dL Final   Hgb urine dipstick 02/12/2022 NEGATIVE  NEGATIVE Final   Bilirubin Urine 02/12/2022 NEGATIVE  NEGATIVE Final   Ketones, ur 02/12/2022 NEGATIVE  NEGATIVE mg/dL Final   Protein, ur 02/12/2022 NEGATIVE  NEGATIVE mg/dL Final   Nitrite 02/12/2022 NEGATIVE  NEGATIVE Final   Leukocytes,Ua 02/12/2022 NEGATIVE  NEGATIVE Final   Performed at [facility], [place] [address] [place], [place] [address]   Specimen Description 02/12/2022 URINE, CLEAN CATCH   Final   Special Requests 02/12/2022    Final                   Value:NONE Performed at [facility], [place] [address], [place], [place] [address]    Culture 02/12/2022 10,000 COLONIES/mL PROTEUS MIRABILIS (A)   Final   Report Status 02/12/2022 02/15/2022 FINAL   Final   Organism ID, Bacteria 02/12/2022 PROTEUS MIRABILIS (A)   Final   Glucose-Capillary 02/12/2022 128 (H)  70 - 99 mg/dL Final   Glucose reference range applies only to samples taken after fasting for at least 8 hours.   Glucose-Capillary 02/12/2022 196 (H)  70 - 99 mg/dL Final   Glucose reference range applies only to samples taken after fasting for at least 8 hours.   Glucose-Capillary 02/13/2022 168 (H)  70 - 99 mg/dL Final   Glucose reference range applies only to samples taken after fasting for at least 8 hours.   Glucose-Capillary 02/13/2022 153 (H)  70 - 99 mg/dL Final   Glucose reference range applies only to samples taken after fasting for at least 8 hours.   Glucose-Capillary 02/13/2022 134 (H)  70 - 99 mg/dL Final   Glucose reference range applies only to samples taken after fasting for at least 8 hours.   Glucose-Capillary 02/13/2022 178 (H)  70 - 99 mg/dL Final   Glucose reference range applies only to samples taken after fasting for at least 8 hours.   Glucose-Capillary 02/14/2022 156 (H)  70 - 99 mg/dL Final   Glucose reference range applies only to samples taken after fasting for at least 8 hours.    Glucose-Capillary 02/14/2022 169 (H)  70 - 99 mg/dL Final   Glucose reference range applies only to samples taken after fasting for at least 8 hours.   Glucose-Capillary 02/14/2022 156 (H)  70 - 99 mg/dL Final   Glucose reference range applies only to samples taken after fasting for at least 8 hours.   Glucose-Capillary 02/14/2022 148 (H)  70 - 99 mg/dL Final   Glucose reference range applies only to samples taken after fasting for at least 8 hours.   Glucose-Capillary 02/15/2022 159 (H)  70 - 99 mg/dL Final   Glucose reference range applies only to samples taken after fasting for at least 8 hours.   Glucose-Capillary 02/15/2022 125 (H)  70 - 99 mg/dL Final   Glucose reference range applies only to samples taken after fasting for at least 8 hours.   Glucose-Capillary 02/15/2022 142 (H)  70 - 99 mg/dL Final   Glucose reference range applies only to samples taken after fasting for at least 8 hours.  Glucose-Capillary 02/15/2022 128 (H)  70 - 99 mg/dL Final   Glucose reference range applies only to samples taken after fasting for at least 8 hours.   Glucose-Capillary 02/16/2022 137 (H)  70 - 99 mg/dL Final   Glucose reference range applies only to samples taken after fasting for at least 8 hours.   WBC 02/16/2022 6.0  4.0 - 10.5 K/uL Final   RBC 02/16/2022 4.48  3.87 - 5.11 MIL/uL Final   Hemoglobin 02/16/2022 10.7 (L)  12.0 - 15.0 g/dL Final   HCT 02/16/2022 35.4 (L)  36.0 - 46.0 % Final   MCV 02/16/2022 79.0 (L)  80.0 - 100.0 fL Final   MCH 02/16/2022 23.9 (L)  26.0 - 34.0 pg Final   MCHC 02/16/2022 30.2  30.0 - 36.0 g/dL Final   RDW 02/16/2022 17.0 (H)  11.5 - 15.5 % Final   Platelets 02/16/2022 387  150 - 400 K/uL Final   nRBC 02/16/2022 0.0  0.0 - 0.2 % Final   Neutrophils Relative % 02/16/2022 54  % Final   Neutro Abs 02/16/2022 3.2  1.7 - 7.7 K/uL Final   Lymphocytes Relative 02/16/2022 35  % Final   Lymphs Abs 02/16/2022 2.1  0.7 - 4.0 K/uL Final   Monocytes Relative 02/16/2022  6  % Final   Monocytes Absolute 02/16/2022 0.4  0.1 - 1.0 K/uL Final   Eosinophils Relative 02/16/2022 2  % Final   Eosinophils Absolute 02/16/2022 0.1  0.0 - 0.5 K/uL Final   Basophils Relative 02/16/2022 2  % Final   Basophils Absolute 02/16/2022 0.1  0.0 - 0.1 K/uL Final   Immature Granulocytes 02/16/2022 1  % Final   Abs Immature Granulocytes 02/16/2022 0.04  0.00 - 0.07 K/uL Final   Performed at [facility], [place] [address] [place], [place] [address]   Glucose-Capillary 02/16/2022 131 (H)  70 - 99 mg/dL Final   Glucose reference range applies only to samples taken after fasting for at least 8 hours.   Glucose-Capillary 02/16/2022 123 (H)  70 - 99 mg/dL Final   Glucose reference range applies only to samples taken after fasting for at least 8 hours.   Glucose-Capillary 02/16/2022 195 (H)  70 - 99 mg/dL Final   Glucose reference range applies only to samples taken after fasting for at least 8 hours.   Glucose-Capillary 02/17/2022 148 (H)  70 - 99 mg/dL Final   Glucose reference range applies only to samples taken after fasting for at least 8 hours.  Admission on 02/08/2022, Discharged on 02/09/2022  Component Date Value Ref Range Status   B Natriuretic Peptide 02/08/2022 40.2  0.0 - 100.0 pg/mL Final   Performed at [facility], [address], [place], [place] [address]   Troponin I (High Sensitivity) 02/08/2022 4  <18 ng/L Final   Comment: (NOTE) Elevated high sensitivity troponin I (hsTnI) values and significant  changes across serial measurements may suggest ACS but many other  chronic and acute conditions are known to elevate hsTnI results.  Refer to the "Links" section for chest pain algorithms and additional  guidance. Performed at [facility], [place] [address] [place], [place] [address]    WBC 02/08/2022 8.5  4.0 - 10.5 K/uL Final   RBC 02/08/2022 4.30  3.87 - 5.11 MIL/uL Final   Hemoglobin 02/08/2022 10.7 (L)  12.0 - 15.0 g/dL Final   HCT 02/08/2022  33.1 (L)  36.0 - 46.0 % Final   MCV 02/08/2022 77.0 (  L)  80.0 - 100.0 fL Final   MCH 02/08/2022 24.9 (L)  26.0 - 34.0 pg Final   MCHC 02/08/2022 32.3  30.0 - 36.0 g/dL Final   RDW 02/08/2022 16.5 (H)  11.5 - 15.5 % Final   Platelets 02/08/2022 361  150 - 400 K/uL Final   nRBC 02/08/2022 0.0  0.0 - 0.2 % Final   Performed at [facility], [place] [address], [place], [place] [address]   Sodium 02/08/2022 131 (L)  135 - 145 mmol/L Final   Potassium 02/08/2022 4.4  3.5 - 5.1 mmol/L Final   Chloride 02/08/2022 96 (L)  98 - 111 mmol/L Final   CO2 02/08/2022 25  22 - 32 mmol/L Final   Glucose, Bld 02/08/2022 126 (H)  70 - 99 mg/dL Final   Glucose reference range applies only to samples taken after fasting for at least 8 hours.   BUN 02/08/2022 11  6 - 20 mg/dL Final   Creatinine, Ser 02/08/2022 0.52  0.44 - 1.00 mg/dL Final   Calcium 02/08/2022 9.4  8.9 - 10.3 mg/dL Final   Total Protein 02/08/2022 7.5  6.5 - 8.1 g/dL Final   Albumin 02/08/2022 3.6  3.5 - 5.0 g/dL Final   AST 02/08/2022 22  15 - 41 U/L Final   ALT 02/08/2022 20  0 - 44 U/L Final   Alkaline Phosphatase 02/08/2022 67  38 - 126 U/L Final   Total Bilirubin 02/08/2022 0.1 (L)  0.3 - 1.2 mg/dL Final   GFR, Estimated 02/08/2022 >60  >60 mL/min Final   Comment: (NOTE) Calculated using the CKD-EPI Creatinine Equation (2021)    Anion gap 02/08/2022 10  5 - 15 Final   Performed at [place] [address] [place], [place] [address]   Troponin I (High Sensitivity) 02/08/2022 5  <18 ng/L Final   Comment: (NOTE) Elevated high sensitivity troponin I (hsTnI) values and significant  changes across serial measurements may suggest ACS but many other  chronic and acute conditions are known to elevate hsTnI results.  Refer to the "Links" section for chest pain algorithms and additional  guidance. Performed at [facility], [place] [address] [place], [place] [address]    WBC 02/09/2022 7.3  4.0 - 10.5 K/uL Final   RBC  02/09/2022 4.70  3.87 - 5.11 MIL/uL Final   Hemoglobin 02/09/2022 11.4 (L)  12.0 - 15.0 g/dL Final   HCT 02/09/2022 36.6  36.0 - 46.0 % Final   MCV 02/09/2022 77.9 (L)  80.0 - 100.0 fL Final   MCH 02/09/2022 24.3 (L)  26.0 - 34.0 pg Final   MCHC 02/09/2022 31.1  30.0 - 36.0 g/dL Final   RDW 02/09/2022 16.6 (H)  11.5 - 15.5 % Final   Platelets 02/09/2022 403 (H)  150 - 400 K/uL Final   nRBC 02/09/2022 0.0  0.0 - 0.2 % Final   Performed at [facility] [address], [place],  [address]   Glucose-Capillary 02/08/2022 117 (H)  70 - 99 mg/dL Final   Glucose reference range applies only to samples taken after fasting for at least 8 hours.   Sodium 02/09/2022 138  135 - 145 mmol/L Final   Potassium 02/09/2022 3.7  3.5 - 5.1 mmol/L Final   Chloride 02/09/2022 103  98 - 111 mmol/L Final   CO2 02/09/2022 24  22 - 32 mmol/L Final   Glucose, Bld 02/09/2022 134 (H)  70 - 99 mg/dL Final  Glucose reference range applies only to samples taken after fasting for at least 8 hours.   BUN 02/09/2022 9  6 - 20 mg/dL Final   Creatinine, Ser 02/09/2022 0.44  0.44 - 1.00 mg/dL Final   Calcium 02/09/2022 8.3 (L)  8.9 - 10.3 mg/dL Final   GFR, Estimated 02/09/2022 >60  >60 mL/min Final   Comment: (NOTE) Calculated using the CKD-EPI Creatinine Equation (2021)    Anion gap 02/09/2022 11  5 - 15 Final   Performed at [facility], [place] [address], [place], [place] [address]   Iron 02/09/2022 20 (L)  28 - 170 ug/dL Final   TIBC 02/09/2022 455 (H)  250 - 450 ug/dL Final   Saturation Ratios 02/09/2022 4 (L)  10.4 - 31.8 % Final   UIBC 02/09/2022 435  ug/dL Final   Performed at [facility], [place] [address] [place], [place] [address]   Ferritin 02/09/2022 2 (L)  11 - 307 ng/mL Final   Performed at [place] [address] [place], [place] [address]   Weight 02/09/2022 2,846.58  oz Final   Height 02/09/2022 62  in Final   BP 02/09/2022 143/80  mmHg Final   WBC 02/09/2022 8.1   4.0 - 10.5 K/uL Final   RBC 02/09/2022 4.38  3.87 - 5.11 MIL/uL Final   Hemoglobin 02/09/2022 10.9 (L)  12.0 - 15.0 g/dL Final   HCT 02/09/2022 33.3 (L)  36.0 - 46.0 % Final   MCV 02/09/2022 76.0 (L)  80.0 - 100.0 fL Final   MCH 02/09/2022 24.9 (L)  26.0 - 34.0 pg Final   MCHC 02/09/2022 32.7  30.0 - 36.0 g/dL Final   RDW 02/09/2022 16.5 (H)  11.5 - 15.5 % Final   Platelets 02/09/2022 397  150 - 400 K/uL Final   nRBC 02/09/2022 0.0  0.0 - 0.2 % Final   Neutrophils Relative % 02/09/2022 66  % Final   Neutro Abs 02/09/2022 5.4  1.7 - 7.7 K/uL Final   Lymphocytes Relative 02/09/2022 24  % Final   Lymphs Abs 02/09/2022 1.9  0.7 - 4.0 K/uL Final   Monocytes Relative 02/09/2022 8  % Final   Monocytes Absolute 02/09/2022 0.6  0.1 - 1.0 K/uL Final   Eosinophils Relative 02/09/2022 1  % Final   Eosinophils Absolute 02/09/2022 0.1  0.0 - 0.5 K/uL Final   Basophils Relative 02/09/2022 1  % Final   Basophils Absolute 02/09/2022 0.1  0.0 - 0.1 K/uL Final   Immature Granulocytes 02/09/2022 0  % Final   Abs Immature Granulocytes 02/09/2022 0.03  0.00 - 0.07 K/uL Final   Performed at [facility], [place] [address], [place], [place] [address]   Glucose-Capillary 02/09/2022 238 (H)  70 - 99 mg/dL Final   Glucose reference range applies only to samples taken after fasting for at least 8 hours.   Glucose-Capillary 02/09/2022 91  70 - 99 mg/dL Final   Glucose reference range applies only to samples taken after fasting for at least 8 hours.   CRP 02/09/2022 <0.5  <1.0 mg/dL Final   Performed at [facility] [address] [place], [place] [address]   Sed Rate 02/09/2022 15  0 - 22 mm/hr Final   Performed at [facility] [address] [place], [place] [address]   Glucose-Capillary 02/09/2022 137 (H)  70 - 99 mg/dL Final   Glucose reference range applies only to samples taken after fasting  for at least 8 hours.  Admission on 01/24/2022, Discharged on 02/08/2022  Component Date Value  Ref Range Status   Glucose-Capillary 01/24/2022 143 (H)  70 - 99 mg/dL Final   Glucose reference range applies only to samples taken after fasting for at least 8 hours.   Glucose-Capillary 01/24/2022 120 (H)  70 - 99 mg/dL Final   Glucose reference range applies only to samples taken after fasting for at least 8 hours.   Glucose-Capillary 01/25/2022 122 (H)  70 - 99 mg/dL Final   Glucose reference range applies only to samples taken after fasting for at least 8 hours.   Glucose-Capillary 01/25/2022 190 (H)  70 - 99 mg/dL Final   Glucose reference range applies only to samples taken after fasting for at least 8 hours.   Glucose-Capillary 01/25/2022 163 (H)  70 - 99 mg/dL Final   Glucose reference range applies only to samples taken after fasting for at least 8 hours.   WBC 01/26/2022 6.5  4.0 - 10.5 K/uL Final   RBC 01/26/2022 4.53  3.87 - 5.11 MIL/uL Final   Hemoglobin 01/26/2022 11.3 (L)  12.0 - 15.0 g/dL Final   HCT 01/26/2022 36.4  36.0 - 46.0 % Final   MCV 01/26/2022 80.4  80.0 - 100.0 fL Final   MCH 01/26/2022 24.9 (L)  26.0 - 34.0 pg Final   MCHC 01/26/2022 31.0  30.0 - 36.0 g/dL Final   RDW 01/26/2022 17.3 (H)  11.5 - 15.5 % Final   Platelets 01/26/2022 327  150 - 400 K/uL Final   nRBC 01/26/2022 0.0  0.0 - 0.2 % Final   Neutrophils Relative % 01/26/2022 60  % Final   Neutro Abs 01/26/2022 3.9  1.7 - 7.7 K/uL Final   Lymphocytes Relative 01/26/2022 31  % Final   Lymphs Abs 01/26/2022 2.0  0.7 - 4.0 K/uL Final   Monocytes Relative 01/26/2022 7  % Final   Monocytes Absolute 01/26/2022 0.5  0.1 - 1.0 K/uL Final   Eosinophils Relative 01/26/2022 1  % Final   Eosinophils Absolute 01/26/2022 0.1  0.0 - 0.5 K/uL Final   Basophils Relative 01/26/2022 1  % Final   Basophils Absolute 01/26/2022 0.1  0.0 - 0.1 K/uL Final   Immature Granulocytes 01/26/2022 0  % Final   Abs Immature Granulocytes 01/26/2022 0.02  0.00 - 0.07 K/uL Final   Performed at [facility], [name] [address], [place],  [address]   Glucose-Capillary 01/26/2022 149 (H)  70 - 99 mg/dL Final   Glucose reference range applies only to samples taken after fasting for at least 8 hours.   Glucose-Capillary 01/26/2022 130 (H)  70 - 99 mg/dL Final   Glucose reference range applies only to samples taken after fasting for at least 8 hours.   Glucose-Capillary 01/26/2022 151 (H)  70 - 99 mg/dL Final   Glucose reference range applies only to samples taken after fasting for at least 8 hours.   Glucose-Capillary 01/26/2022 170 (H)  70 - 99 mg/dL Final   Glucose reference range applies only to samples taken after fasting for at least 8 hours.   Glucose-Capillary 01/27/2022 129 (H)  70 - 99 mg/dL Final   Glucose reference range applies only to samples taken after fasting for at least 8 hours.   Glucose-Capillary 01/27/2022 101 (H)  70 - 99 mg/dL Final   Glucose reference range applies only to samples taken after fasting for at least 8 hours.   Glucose-Capillary 01/27/2022  220 (H)  70 - 99 mg/dL Final   Glucose reference range applies only to samples taken after fasting for at least 8 hours.   Glucose-Capillary 01/27/2022 154 (H)  70 - 99 mg/dL Final   Glucose reference range applies only to samples taken after fasting for at least 8 hours.   Glucose-Capillary 01/27/2022 163 (H)  70 - 99 mg/dL Final   Glucose reference range applies only to samples taken after fasting for at least 8 hours.   Glucose-Capillary 01/28/2022 127 (H)  70 - 99 mg/dL Final   Glucose reference range applies only to samples taken after fasting for at least 8 hours.   Glucose-Capillary 01/28/2022 110 (H)  70 - 99 mg/dL Final   Glucose reference range applies only to samples taken after fasting for at least 8 hours.   Glucose-Capillary 01/28/2022 167 (H)  70 - 99 mg/dL Final   Glucose reference range applies only to samples taken after fasting for at least 8 hours.   Glucose-Capillary 01/29/2022 145 (H)  70 - 99 mg/dL Final   Glucose  reference range applies only to samples taken after fasting for at least 8 hours.   Glucose-Capillary 01/29/2022 203 (H)  70 - 99 mg/dL Final   Glucose reference range applies only to samples taken after fasting for at least 8 hours.   Glucose-Capillary 01/29/2022 153 (H)  70 - 99 mg/dL Final   Glucose reference range applies only to samples taken after fasting for at least 8 hours.   Glucose-Capillary 01/29/2022 166 (H)  70 - 99 mg/dL Final   Glucose reference range applies only to samples taken after fasting for at least 8 hours.   Glucose-Capillary 01/30/2022 142 (H)  70 - 99 mg/dL Final   Glucose reference range applies only to samples taken after fasting for at least 8 hours.   Glucose-Capillary 01/30/2022 138 (H)  70 - 99 mg/dL Final   Glucose reference range applies only to samples taken after fasting for at least 8 hours.   Glucose-Capillary 01/30/2022 185 (H)  70 - 99 mg/dL Final   Glucose reference range applies only to samples taken after fasting for at least 8 hours.   Glucose-Capillary 01/30/2022 220 (H)  70 - 99 mg/dL Final   Glucose reference range applies only to samples taken after fasting for at least 8 hours.   Glucose-Capillary 01/31/2022 147 (H)  70 - 99 mg/dL Final   Glucose reference range applies only to samples taken after fasting for at least 8 hours.   Glucose-Capillary 01/31/2022 131 (H)  70 - 99 mg/dL Final   Glucose reference range applies only to samples taken after fasting for at least 8 hours.   Glucose-Capillary 01/31/2022 169 (H)  70 - 99 mg/dL Final   Glucose reference range applies only to samples taken after fasting for at least 8 hours.   Glucose-Capillary 01/31/2022 144 (H)  70 - 99 mg/dL Final   Glucose reference range applies only to samples taken after fasting for at least 8 hours.   Comment 1 01/31/2022 RN AWARE   Final   Glucose-Capillary 02/01/2022 117 (H)  70 - 99 mg/dL Final   Glucose reference range applies only to samples taken after fasting  for at least 8 hours.   Glucose-Capillary 02/01/2022 180 (H)  70 - 99 mg/dL Final   Glucose reference range applies only to samples taken after fasting for at least 8 hours.   Glucose-Capillary 02/01/2022 209 (H)  70 - 99 mg/dL Final  Glucose reference range applies only to samples taken after fasting for at least 8 hours.   Glucose-Capillary 02/02/2022 131 (H)  70 - 99 mg/dL Final   Glucose reference range applies only to samples taken after fasting for at least 8 hours.   WBC 02/02/2022 7.5  4.0 - 10.5 K/uL Final   RBC 02/02/2022 4.52  3.87 - 5.11 MIL/uL Final   Hemoglobin 02/02/2022 11.3 (L)  12.0 - 15.0 g/dL Final   HCT 02/02/2022 35.0 (L)  36.0 - 46.0 % Final   MCV 02/02/2022 77.4 (L)  80.0 - 100.0 fL Final   MCH 02/02/2022 25.0 (L)  26.0 - 34.0 pg Final   MCHC 02/02/2022 32.3  30.0 - 36.0 g/dL Final   RDW 02/02/2022 16.7 (H)  11.5 - 15.5 % Final   Platelets 02/02/2022 345  150 - 400 K/uL Final   nRBC 02/02/2022 0.0  0.0 - 0.2 % Final   Neutrophils Relative % 02/02/2022 63  % Final   Neutro Abs 02/02/2022 4.7  1.7 - 7.7 K/uL Final   Lymphocytes Relative 02/02/2022 28  % Final   Lymphs Abs 02/02/2022 2.1  0.7 - 4.0 K/uL Final   Monocytes Relative 02/02/2022 7  % Final   Monocytes Absolute 02/02/2022 0.5  0.1 - 1.0 K/uL Final   Eosinophils Relative 02/02/2022 1  % Final   Eosinophils Absolute 02/02/2022 0.1  0.0 - 0.5 K/uL Final   Basophils Relative 02/02/2022 1  % Final   Basophils Absolute 02/02/2022 0.1  0.0 - 0.1 K/uL Final   Immature Granulocytes 02/02/2022 0  % Final   Abs Immature Granulocytes 02/02/2022 0.03  0.00 - 0.07 K/uL Final   Performed at [facility], [place] [address], [place], [place] [address]   Glucose-Capillary 02/02/2022 95  70 - 99 mg/dL Final   Glucose reference range applies only to samples taken after fasting for at least 8 hours.   Glucose-Capillary 02/02/2022 120 (H)  70 - 99 mg/dL Final   Glucose reference range applies only to samples taken after  fasting for at least 8 hours.   Glucose-Capillary 02/02/2022 191 (H)  70 - 99 mg/dL Final   Glucose reference range applies only to samples taken after fasting for at least 8 hours.   Glucose-Capillary 02/03/2022 148 (H)  70 - 99 mg/dL Final   Glucose reference range applies only to samples taken after fasting for at least 8 hours.   Glucose-Capillary 02/03/2022 122 (H)  70 - 99 mg/dL Final   Glucose reference range applies only to samples taken after fasting for at least 8 hours.   Glucose-Capillary 02/03/2022 233 (H)  70 - 99 mg/dL Final   Glucose reference range applies only to samples taken after fasting for at least 8 hours.   Glucose-Capillary 02/04/2022 126 (H)  70 - 99 mg/dL Final   Glucose reference range applies only to samples taken after fasting for at least 8 hours.   Glucose-Capillary 02/04/2022 117 (H)  70 - 99 mg/dL Final   Glucose reference range applies only to samples taken after fasting for at least 8 hours.   Glucose-Capillary 02/04/2022 135 (H)  70 - 99 mg/dL Final   Glucose reference range applies only to samples taken after fasting for at least 8 hours.   Glucose-Capillary 02/04/2022 197 (H)  70 - 99 mg/dL Final   Glucose reference range applies only to samples taken after fasting for at least 8 hours.   Glucose-Capillary 02/05/2022 170 (H)  70 -  99 mg/dL Final   Glucose reference range applies only to samples taken after fasting for at least 8 hours.   Glucose-Capillary 02/05/2022 107 (H)  70 - 99 mg/dL Final   Glucose reference range applies only to samples taken after fasting for at least 8 hours.   Glucose-Capillary 02/05/2022 184 (H)  70 - 99 mg/dL Final   Glucose reference range applies only to samples taken after fasting for at least 8 hours.   Glucose-Capillary 02/05/2022 256 (H)  70 - 99 mg/dL Final   Glucose reference range applies only to samples taken after fasting for at least 8 hours.   Glucose-Capillary 02/06/2022 144 (H)  70 - 99 mg/dL Final   Glucose  reference range applies only to samples taken after fasting for at least 8 hours.   Glucose-Capillary 02/06/2022 190 (H)  70 - 99 mg/dL Final   Glucose reference range applies only to samples taken after fasting for at least 8 hours.   Glucose-Capillary 02/06/2022 92  70 - 99 mg/dL Final   Glucose reference range applies only to samples taken after fasting for at least 8 hours.   Trichomonas 02/06/2022 Negative   Final   Bacterial Vaginitis-Urine 02/06/2022 Negative   Final   Molecular Comment 02/06/2022 For tests bacteria and/or candida, this specimen does not meet the   Final   Molecular Comment 02/06/2022 strict criteria set by the FDA. The result interpretation should be   Final   Molecular Comment 02/06/2022 considered in conjunction with the patient's clinical history.   Final   Comment 02/06/2022 Normal Reference Range Trichomonas - Negative   Final   Glucose-Capillary 02/06/2022 197 (H)  70 - 99 mg/dL Final   Glucose reference range applies only to samples taken after fasting for at least 8 hours.   Glucose-Capillary 02/07/2022 129 (H)  70 - 99 mg/dL Final   Glucose reference range applies only to samples taken after fasting for at least 8 hours.   Glucose-Capillary 02/07/2022 207 (H)  70 - 99 mg/dL Final   Glucose reference range applies only to samples taken after fasting for at least 8 hours.   Glucose-Capillary 02/07/2022 153 (H)  70 - 99 mg/dL Final   Glucose reference range applies only to samples taken after fasting for at least 8 hours.   Glucose-Capillary 02/08/2022 129 (H)  70 - 99 mg/dL Final   Glucose reference range applies only to samples taken after fasting for at least 8 hours.   Glucose-Capillary 02/08/2022 107 (H)  70 - 99 mg/dL Final   Glucose reference range applies only to samples taken after fasting for at least 8 hours.  Admission on 01/23/2022, Discharged on 01/24/2022  Component Date Value Ref Range Status   Sodium 01/23/2022 140  135 - 145 mmol/L Final    Potassium 01/23/2022 4.4  3.5 - 5.1 mmol/L Final   Chloride 01/23/2022 101  98 - 111 mmol/L Final   CO2 01/23/2022 25  22 - 32 mmol/L Final   Glucose, Bld 01/23/2022 198 (H)  70 - 99 mg/dL Final   Glucose reference range applies only to samples taken after fasting for at least 8 hours.   BUN 01/23/2022 13  6 - 20 mg/dL Final   Creatinine, Ser 01/23/2022 0.62  0.44 - 1.00 mg/dL Final   Calcium 01/23/2022 9.3  8.9 - 10.3 mg/dL Final   GFR, Estimated 01/23/2022 >60  >60 mL/min Final   Comment: (NOTE) Calculated using the CKD-EPI Creatinine Equation (2021)    Anion  gap 01/23/2022 14  5 - 15 Final   Performed at [facility], [place] [address], [place], [place] [address]   WBC 01/23/2022 5.8  4.0 - 10.5 K/uL Final   RBC 01/23/2022 4.63  3.87 - 5.11 MIL/uL Final   Hemoglobin 01/23/2022 11.7 (L)  12.0 - 15.0 g/dL Final   HCT 01/23/2022 37.4  36.0 - 46.0 % Final   MCV 01/23/2022 80.8  80.0 - 100.0 fL Final   MCH 01/23/2022 25.3 (L)  26.0 - 34.0 pg Final   MCHC 01/23/2022 31.3  30.0 - 36.0 g/dL Final   RDW 01/23/2022 17.9 (H)  11.5 - 15.5 % Final   Platelets 01/23/2022 333  150 - 400 K/uL Final   nRBC 01/23/2022 0.0  0.0 - 0.2 % Final   Performed at [facility] [address] [place], [place] [address]   Troponin I (High Sensitivity) 01/23/2022 6  <18 ng/L Final   Comment: (NOTE) Elevated high sensitivity troponin I (hsTnI) values and significant  changes across serial measurements may suggest ACS but many other  chronic and acute conditions are known to elevate hsTnI results.  Refer to the "Links" section for chest pain algorithms and additional  guidance. Performed at [facility], [place] [address], [place], [place] [address]    Troponin I (High Sensitivity) 01/23/2022 5  <18 ng/L Final   Comment: (NOTE) Elevated high sensitivity troponin I (hsTnI) values and significant  changes across serial measurements may suggest ACS but many other  chronic and acute conditions  are known to elevate hsTnI results.  Refer to the "Links" section for chest pain algorithms and additional  guidance. Performed at [facility], [place] [address], [place], [place] [address]    SARS Coronavirus 2 by RT PCR 01/23/2022 NEGATIVE  NEGATIVE Final   Comment: (NOTE) SARS-CoV-2 target nucleic acids are NOT DETECTED.  The SARS-CoV-2 RNA is generally detectable in upper and lower respiratory specimens during the acute phase of infection. The lowest concentration of SARS-CoV-2 viral copies this assay can detect is 250 copies / mL. A negative result does not preclude SARS-CoV-2 infection and should not be used as the sole basis for treatment or other patient management decisions.  A negative result may occur with improper specimen collection / handling, submission of specimen other than nasopharyngeal swab, presence of viral mutation(s) within the areas targeted by this assay, and inadequate number of viral copies (<250 copies / mL). A negative result must be combined with clinical observations, patient history, and epidemiological information.  Fact Sheet for Patients:   https://www.patel.info/  Fact Sheet for Healthcare Providers: https://hall.com/  This test is not yet approved or                           cleared by the [place] FDA and has been authorized for detection and/or diagnosis of SARS-CoV-2 by FDA under an Emergency Use Authorization (EUA).  This EUA will remain in effect (meaning this test can be used) for the duration of the COVID-19 declaration under Section 564(b)(1) of the Act, 21 U.S.C. section 360bbb-3(b)(1), unless the authorization is terminated or revoked sooner.  Performed at [facility], [place] [address] [place], [place] [address]    B Natriuretic Peptide 01/23/2022 41.3  0.0 - 100.0 pg/mL Final   Performed at [facility] [address], [place], [place] [address]   Group A Strep  by  PCR 01/23/2022 NOT DETECTED  NOT DETECTED Final   Performed at [facility], [place] [address], [place], [place] [address]   Color, Urine 01/23/2022 YELLOW  YELLOW Final   APPearance 01/23/2022 CLEAR  CLEAR Final   Specific Gravity, Urine 01/23/2022 1.018  1.005 - 1.030 Final   pH 01/23/2022 7.0  5.0 - 8.0 Final   Glucose, UA 01/23/2022 NEGATIVE  NEGATIVE mg/dL Final   Hgb urine dipstick 01/23/2022 NEGATIVE  NEGATIVE Final   Bilirubin Urine 01/23/2022 NEGATIVE  NEGATIVE Final   Ketones, ur 01/23/2022 NEGATIVE  NEGATIVE mg/dL Final   Protein, ur 01/23/2022 NEGATIVE  NEGATIVE mg/dL Final   Nitrite 01/23/2022 NEGATIVE  NEGATIVE Final   Leukocytes,Ua 01/23/2022 TRACE (A)  NEGATIVE Final   RBC / HPF 01/23/2022 0-5  0 - 5 RBC/hpf Final   WBC, UA 01/23/2022 0-5  0 - 5 WBC/hpf Final   Bacteria, UA 01/23/2022 NONE SEEN  NONE SEEN Final   Squamous Epithelial / LPF 01/23/2022 0-5  0 - 5 Final   Mucus 01/23/2022 PRESENT   Final   Performed at [facility], [place] [address] [place],  [address]   SARS Coronavirus 2 by RT PCR 01/23/2022 NEGATIVE  NEGATIVE Final   Comment: (NOTE) SARS-CoV-2 target nucleic acids are NOT DETECTED.  The SARS-CoV-2 RNA is generally detectable in upper respiratory specimens during the acute phase of infection. The lowest concentration of SARS-CoV-2 viral copies this assay can detect is 138 copies/mL. A negative result does not preclude SARS-Cov-2 infection and should not be used as the sole basis for treatment or other patient management decisions. A negative result may occur with  improper specimen collection/handling, submission of specimen other than nasopharyngeal swab, presence of viral mutation(s) within the areas targeted by this assay, and inadequate number of viral copies(<138 copies/mL). A negative result must be combined with clinical observations, patient history, and epidemiological information. The expected result is Negative.  Fact  Sheet for Patients:  EntrepreneurPulse.com.au  Fact Sheet for Healthcare Providers:  IncredibleEmployment.be  This test is no                          t yet approved or cleared by the [place] FDA and  has been authorized for detection and/or diagnosis of SARS-CoV-2 by FDA under an Emergency Use Authorization (EUA). This EUA will remain  in effect (meaning this test can be used) for the duration of the COVID-19 declaration under Section 564(b)(1) of the Act, 21 U.S.C.section 360bbb-3(b)(1), unless the authorization is terminated  or revoked sooner.       Influenza A by PCR 01/23/2022 NEGATIVE  NEGATIVE Final   Influenza B by PCR 01/23/2022 NEGATIVE  NEGATIVE Final   Comment: (NOTE) The Xpert Xpress SARS-CoV-2/FLU/RSV plus assay is intended as an aid in the diagnosis of influenza from Nasopharyngeal swab specimens and should not be used as a sole basis for treatment. Nasal washings and aspirates are unacceptable for Xpert Xpress SARS-CoV-2/FLU/RSV testing.  Fact Sheet for Patients: EntrepreneurPulse.com.au  Fact Sheet for Healthcare Providers: IncredibleEmployment.be  This test is not yet approved or cleared by the [place] FDA and has been authorized for detection and/or diagnosis of SARS-CoV-2 by FDA under an Emergency Use Authorization (EUA). This EUA will remain in effect (meaning this test can be used) for the duration of the COVID-19 declaration under Section 564(b)(1) of the Act, 21 U.S.C. section 360bbb-3(b)(1), unless the authorization is terminated or revoked.  Performed at [facility], [place] [address], [place], [place] [address]    WBC 01/24/2022 6.1  4.0 - 10.5 K/uL Final   RBC 01/24/2022 4.60  3.87 - 5.11 MIL/uL Final   Hemoglobin 01/24/2022 11.7 (L)  12.0 - 15.0 g/dL Final   HCT 01/24/2022 37.0  36.0 - 46.0 % Final   MCV 01/24/2022 80.4  80.0 - 100.0 fL Final   MCH  01/24/2022 25.4 (L)  26.0 - 34.0 pg Final   MCHC 01/24/2022 31.6  30.0 - 36.0 g/dL Final   RDW 01/24/2022 17.6 (H)  11.5 - 15.5 % Final   Platelets 01/24/2022 334  150 - 400 K/uL Final   nRBC 01/24/2022 0.0  0.0 - 0.2 % Final   Neutrophils Relative % 01/24/2022 53  % Final   Neutro Abs 01/24/2022 3.3  1.7 - 7.7 K/uL Final   Lymphocytes Relative 01/24/2022 33  % Final   Lymphs Abs 01/24/2022 2.0  0.7 - 4.0 K/uL Final   Monocytes Relative 01/24/2022 11  % Final   Monocytes Absolute 01/24/2022 0.7  0.1 - 1.0 K/uL Final   Eosinophils Relative 01/24/2022 2  % Final   Eosinophils Absolute 01/24/2022 0.1  0.0 - 0.5 K/uL Final   Basophils Relative 01/24/2022 1  % Final   Basophils Absolute 01/24/2022 0.1  0.0 - 0.1 K/uL Final   Immature Granulocytes 01/24/2022 0  % Final   Abs Immature Granulocytes 01/24/2022 0.02  0.00 - 0.07 K/uL Final   Performed at [facility], [place] [address] [place], [place] [address]   Glucose-Capillary 01/24/2022 110 (H)  70 - 99 mg/dL Final   Glucose reference range applies only to samples taken after fasting for at least 8 hours.  No results displayed because visit has over 200 results.    Admission on 11/22/2021, Discharged on 11/22/2021  Component Date Value Ref Range Status   Glucose-Capillary 11/22/2021 169 (H)  70 - 99 mg/dL Final   Glucose reference range applies only to samples taken after fasting for at least 8 hours.  No results displayed because visit has over 200 results.    Admission on 10/04/2021, Discharged on 10/22/2021  Component Date Value Ref Range Status   SARS Coronavirus 2 by RT PCR 10/04/2021 NEGATIVE  NEGATIVE Final   Comment: (NOTE) SARS-CoV-2 target nucleic acids are NOT DETECTED.  The SARS-CoV-2 RNA is generally detectable in upper respiratory specimens during the acute phase of infection. The lowest concentration of SARS-CoV-2 viral copies this assay can detect is 138 copies/mL. A negative result does not preclude  SARS-Cov-2 infection and should not be used as the sole basis for treatment or other patient management decisions. A negative result may occur with  improper specimen collection/handling, submission of specimen other than nasopharyngeal swab, presence of viral mutation(s) within the areas targeted by this assay, and inadequate number of viral copies(<138 copies/mL). A negative result must be combined with clinical observations, patient history, and epidemiological information. The expected result is Negative.  Fact Sheet for Patients:  EntrepreneurPulse.com.au  Fact Sheet for Healthcare Providers:  IncredibleEmployment.be  This test is no                          t yet approved or cleared by the [place] FDA and  has been authorized for detection and/or diagnosis of SARS-CoV-2 by FDA under an Emergency Use Authorization (EUA). This EUA will remain  in effect (meaning this test can  be used) for the duration of the COVID-19 declaration under Section 564(b)(1) of the Act, 21 U.S.C.section 360bbb-3(b)(1), unless the authorization is terminated  or revoked sooner.       Influenza A by PCR 10/04/2021 NEGATIVE  NEGATIVE Final   Influenza B by PCR 10/04/2021 NEGATIVE  NEGATIVE Final   Comment: (NOTE) The Xpert Xpress SARS-CoV-2/FLU/RSV plus assay is intended as an aid in the diagnosis of influenza from Nasopharyngeal swab specimens and should not be used as a sole basis for treatment. Nasal washings and aspirates are unacceptable for Xpert Xpress SARS-CoV-2/FLU/RSV testing.  Fact Sheet for Patients: EntrepreneurPulse.com.au  Fact Sheet for Healthcare Providers: IncredibleEmployment.be  This test is not yet approved or cleared by the [place] FDA and has been authorized for detection and/or diagnosis of SARS-CoV-2 by FDA under an Emergency Use Authorization (EUA). This EUA will remain in effect  (meaning this test can be used) for the duration of the COVID-19 declaration under Section 564(b)(1) of the Act, 21 U.S.C. section 360bbb-3(b)(1), unless the authorization is terminated or revoked.  Performed at [facility], [place] [address] [place], [place] [address]    WBC 10/04/2021 8.4  4.0 - 10.5 K/uL Final   RBC 10/04/2021 4.42  3.87 - 5.11 MIL/uL Final   Hemoglobin 10/04/2021 11.6 (L)  12.0 - 15.0 g/dL Final   HCT 10/04/2021 35.7 (L)  36.0 - 46.0 % Final   MCV 10/04/2021 80.8  80.0 - 100.0 fL Final   MCH 10/04/2021 26.2  26.0 - 34.0 pg Final   MCHC 10/04/2021 32.5  30.0 - 36.0 g/dL Final   RDW 10/04/2021 16.0 (H)  11.5 - 15.5 % Final   Platelets 10/04/2021 372  150 - 400 K/uL Final   nRBC 10/04/2021 0.0  0.0 - 0.2 % Final   Neutrophils Relative % 10/04/2021 68  % Final   Neutro Abs 10/04/2021 5.7  1.7 - 7.7 K/uL Final   Lymphocytes Relative 10/04/2021 22  % Final   Lymphs Abs 10/04/2021 1.8  0.7 - 4.0 K/uL Final   Monocytes Relative 10/04/2021 8  % Final   Monocytes Absolute 10/04/2021 0.7  0.1 - 1.0 K/uL Final   Eosinophils Relative 10/04/2021 1  % Final   Eosinophils Absolute 10/04/2021 0.1  0.0 - 0.5 K/uL Final   Basophils Relative 10/04/2021 1  % Final   Basophils Absolute 10/04/2021 0.1  0.0 - 0.1 K/uL Final   Immature Granulocytes 10/04/2021 0  % Final   Abs Immature Granulocytes 10/04/2021 0.03  0.00 - 0.07 K/uL Final   Performed at [facility], [place] [address] [place], [place] [address]   Sodium 10/04/2021 136  135 - 145 mmol/L Final   Potassium 10/04/2021 4.2  3.5 - 5.1 mmol/L Final   Chloride 10/04/2021 104  98 - 111 mmol/L Final   CO2 10/04/2021 25  22 - 32 mmol/L Final   Glucose, Bld 10/04/2021 100 (H)  70 - 99 mg/dL Final   Glucose reference range applies only to samples taken after fasting for at least 8 hours.   BUN 10/04/2021 9  6 - 20 mg/dL Final   Creatinine, Ser 10/04/2021 0.52  0.44 - 1.00 mg/dL Final   Calcium 10/04/2021 9.0  8.9 - 10.3  mg/dL Final   Total Protein 10/04/2021 7.0  6.5 - 8.1 g/dL Final   Albumin 10/04/2021 3.2 (L)  3.5 - 5.0 g/dL Final   AST 10/04/2021 13 (L)  15 - 41 U/L Final   ALT  10/04/2021 10  0 - 44 U/L Final   Alkaline Phosphatase 10/04/2021 61  38 - 126 U/L Final   Total Bilirubin 10/04/2021 0.3  0.3 - 1.2 mg/dL Final   GFR, Estimated 10/04/2021 >60  >60 mL/min Final   Comment: (NOTE) Calculated using the CKD-EPI Creatinine Equation (2021)    Anion gap 10/04/2021 7  5 - 15 Final   Performed at [facility] [address] [place], [place] [address]   Hgb A1c MFr Bld 10/04/2021 7.0 (H)  4.8 - 5.6 % Final   Comment: (NOTE) Pre diabetes:          5.7%-6.4%  Diabetes:              >6.4%  Glycemic control for   <7.0% adults with diabetes    Mean Plasma Glucose 10/04/2021 154.2  mg/dL Final   Performed at [facility], [place] [address], [place], [place] [address]   Cholesterol 10/04/2021 178  0 - 200 mg/dL Final   Triglycerides 10/04/2021 155 (H)  <150 mg/dL Final   HDL 10/04/2021 52  >40 mg/dL Final   Total CHOL/HDL Ratio 10/04/2021 3.4  RATIO Final   VLDL 10/04/2021 31  0 - 40 mg/dL Final   LDL Cholesterol 10/04/2021 95  0 - 99 mg/dL Final   Comment:        Total Cholesterol/HDL:CHD Risk Coronary Heart Disease Risk Table                     Men   Women  1/2 Average Risk   3.4   3.3  Average Risk       5.0   4.4  2 X Average Risk   9.6   7.1  3 X Average Risk  23.4   11.0        Use the calculated Patient Ratio above and the CHD Risk Table to determine the patient's CHD Risk.        ATP III CLASSIFICATION (LDL):  <100     mg/dL   Optimal  100-129  mg/dL   Near or Above                    Optimal  130-159  mg/dL   Borderline  160-189  mg/dL   High  >190     mg/dL   Very High Performed at [facility] [address] [place], [place] [address]    POC Amphetamine UR 10/04/2021 None Detected  NONE DETECTED (Cut Off Level 1000 ng/mL) Final   POC Secobarbital (BAR)  10/04/2021 None Detected  NONE DETECTED (Cut Off Level 300 ng/mL) Final   POC Buprenorphine (BUP) 10/04/2021 None Detected  NONE DETECTED (Cut Off Level 10 ng/mL) Final   POC Oxazepam (BZO) 10/04/2021 None Detected  NONE DETECTED (Cut Off Level 300 ng/mL) Final   POC Cocaine UR 10/04/2021 None Detected  NONE DETECTED (Cut Off Level 300 ng/mL) Final   POC Methamphetamine UR 10/04/2021 None Detected  NONE DETECTED (Cut Off Level 1000 ng/mL) Final   POC Morphine 10/04/2021 None Detected  NONE DETECTED (Cut Off Level 300 ng/mL) Final   POC Methadone UR 10/04/2021 None Detected  NONE DETECTED (Cut Off Level 300 ng/mL) Final   POC Oxycodone UR 10/04/2021 Positive (A)  NONE DETECTED (Cut Off Level 100 ng/mL) Final   POC Marijuana UR 10/04/2021 None Detected  NONE DETECTED (Cut Off Level 50 ng/mL) Final   SARSCOV2ONAVIRUS 2 AG  10/04/2021 NEGATIVE  NEGATIVE Final   Comment: (NOTE) SARS-CoV-2 antigen NOT DETECTED.   Negative results are presumptive.  Negative results do not preclude SARS-CoV-2 infection and should not be used as the sole basis for treatment or other patient management decisions, including infection  control decisions, particularly in the presence of clinical signs and  symptoms consistent with COVID-19, or in those who have been in contact with the virus.  Negative results must be combined with clinical observations, patient history, and epidemiological information. The expected result is Negative.  Fact Sheet for Patients: HandmadeRecipes.com.cy  Fact Sheet for Healthcare Providers: FuneralLife.at  This test is not yet approved or cleared by the [place] FDA and  has been authorized for detection and/or diagnosis of SARS-CoV-2 by FDA under an Emergency Use Authorization (EUA).  This EUA will remain in effect (meaning this test can be used) for the duration of  the COV                          ID-19 declaration under Section  564(b)(1) of the Act, 21 U.S.C. section 360bbb-3(b)(1), unless the authorization is terminated or revoked sooner.     Valproic Acid Lvl 10/04/2021 51  50.0 - 100.0 ug/mL Final   Performed at [facility] [address], [place], [place] [address]   Valproic Acid Lvl 10/08/2021 57  50.0 - 100.0 ug/mL Final   Performed at [facility] [address] [place], [place] [address]   Glucose-Capillary 10/09/2021 123 (H)  70 - 99 mg/dL Final   Glucose reference range applies only to samples taken after fasting for at least 8 hours.  There may be more visits with results that are not included.    Blood Alcohol level:  Lab Results  Component Value Date   ETH <10 11/23/2021   ETH <10 85/27/7824    Metabolic Disorder Labs: Lab Results  Component Value Date   HGBA1C 6.7 (H) 12/28/2021   MPG 145.59 12/28/2021   MPG 154.2 10/04/2021   Lab Results  Component Value Date   PROLACTIN 3.8 (L) 11/23/2021   Lab Results  Component Value Date   CHOL 171 11/23/2021   TRIG 125 11/23/2021   HDL 65 11/23/2021   CHOLHDL 2.6 11/23/2021   VLDL 25 11/23/2021   LDLCALC 81 11/23/2021   LDLCALC 95 10/04/2021    Therapeutic Lab Levels: No results found for: "LITHIUM" Lab Results  Component Value Date   VALPROATE 17 (L) 02/17/2022   VALPROATE 33 (L) 01/18/2022   No results found for: "CBMZ"  Physical Findings   PHQ2-9    Flowsheet Row ED from 11/23/2021 in [facility]  PHQ-2 Total Score 2  PHQ-9 Total Score 3      Flowsheet Row ED from 02/18/2022 in [facility] ED from 02/17/2022 in [facility] ED from 02/09/2022 in [facility] Error: Question 2 not populated No Risk No Risk       Psychiatric Specialty Exam  Presentation  General Appearance:  Appropriate for Environment; Casual  Eye Contact: Good  Speech: Clear and  Coherent; Normal Rate  Speech Volume: Normal  Handedness: Right   Mood and Affect  Mood: Euthymic (bored)  Affect: Appropriate; Congruent; Full Range   Thought Process  Thought Processes: Coherent; Goal Directed; Linear  Descriptions of Associations:Intact  Orientation:Full (Time, Place and Person)  Thought  Content:WDL  Diagnosis of Schizophrenia or Schizoaffective disorder in past: Yes  Duration of Psychotic Symptoms: NA  Hallucinations:Hallucinations: None   Ideas of Reference:None  Suicidal Thoughts:Suicidal Thoughts: No   Homicidal Thoughts:Homicidal Thoughts: No    Sensorium  Memory: Immediate Good  Judgment: Fair  Insight: Fair   Community education officer  Concentration: Good  Attention Span: Good  Recall: Good  Fund of Knowledge: Good  Language: Good   Psychomotor Activity  Psychomotor Activity: Psychomotor Activity: Normal (AIMS 0)    Assets  Assets: Communication Skills; Desire for Improvement   Sleep  Sleep: Sleep: Good    Physical Exam  Physical Exam Vitals and nursing note reviewed.  Constitutional:      General: She is not in acute distress.    Appearance: She is not ill-appearing, toxic-appearing or diaphoretic.  HENT:     Head: Normocephalic and atraumatic.  Pulmonary:     Effort: Pulmonary effort is normal.     Breath sounds: Normal breath sounds.  Musculoskeletal:     Cervical back: Normal range of motion.     Right lower leg: No edema.     Left lower leg: No edema.  Skin:    General: Skin is dry.  Neurological:     Mental Status: She is alert and oriented to person, place, and time.    Blood pressure 119/85, pulse 100, temperature 97.8 F (36.6 C), temperature source Oral, resp. rate 18, SpO2 100 %. There is no height or weight on file to calculate BMI.  Plan:  Coordination of Care with TOC /SW and DSS to secure placement for patient Patient has been psychiatrically cleared and does not meet  criteria for inpatient psychiatric treatment.  Disposition is pending possible placement at [facility].    IDA Started PO iron supplement Recommend CBC, ferritin level recheck in ~45mo JMerrily Brittle DM[place]1/04/2022 10:33 AM

## 2022-03-03 NOTE — ED Notes (Signed)
Pt sleeping@this time. Breathing even and unlabored. Will continue to monitor for safety 

## 2022-03-03 NOTE — Progress Notes (Signed)
Pt is resting quietly. No distress noted. Staff will monitor for pt's safety. °

## 2022-03-03 NOTE — Care Management (Signed)
OBS Care Management   Treatment Team Meeting   In attendance: [name]: [facility] - Patient  Teresa Murillo - Coordinator   The patient will be discharged on (Monday) March 15, 2022 at [facility] will coordinate with the NP to ensure that the patient follow up prescription medication to  [facility] in [place], [place]

## 2022-03-03 NOTE — Progress Notes (Signed)
PHARMACIST - PHYSICIAN ORDER COMMUNICATION  Teresa Murillo is a 62 y.o. year old female with a history of schizophrenia on Clozapine PTA. Continuing this medication order as an inpatient requires that monitoring parameters per REMS requirements must be met.   Clozapine REMS Dispense Authorization was obtained with RDA# [id number]  Last ANC value and date reported on the Clozapine REMS website: 03/03/22 3600. Next [facility] due 03/09/22.  [name] 03/03/2022, 8:13 AM

## 2022-03-03 NOTE — ED Notes (Signed)
Pt amb to bathroom, gait steady, no distress noted.  Monitoring for safety.

## 2022-03-03 NOTE — Progress Notes (Addendum)
Pt is awake, alert and oriented X4. Pt did not voice any complaints of pain or discomfort. No distress noted. Administered scheduled meds with no issue. Pt denies current SI/HI/AVH, plan or intent. Staff will monitor for pt's safety. 

## 2022-03-04 DIAGNOSIS — F209 Schizophrenia, unspecified: Secondary | ICD-10-CM | POA: Diagnosis not present

## 2022-03-04 LAB — GLUCOSE, CAPILLARY
Glucose-Capillary: 116 mg/dL — ABNORMAL HIGH (ref 70–99)
Glucose-Capillary: 104 mg/dL — ABNORMAL HIGH (ref 70–99)
Glucose-Capillary: 156 mg/dL — ABNORMAL HIGH (ref 70–99)
Glucose-Capillary: 201 mg/dL — ABNORMAL HIGH (ref 70–99)

## 2022-03-04 NOTE — ED Notes (Signed)
Patient's blood sugar is 116. 

## 2022-03-04 NOTE — ED Notes (Signed)
Pt is currently sleeping, no distress noted, environmental check complete, will continue to monitor patient for safety. ? ?

## 2022-03-04 NOTE — ED Notes (Signed)
Patients blood sugar is 156

## 2022-03-04 NOTE — ED Notes (Signed)
Snacks given 

## 2022-03-04 NOTE — Progress Notes (Deleted)
Cardiology Clinic Note   Patient Name: Teresa Murillo Date of Encounter: 03/04/2022  Primary Care Provider:  Ileene Hutchinson, PA Primary Cardiologist:  Berniece Salines, DO  Patient Profile    Teresa Murillo is a 62 y.o. female with a past medical history of PAF/aflutter, pericardial effusion, hypertension, hyperlipidemia, T2DM., hypothyroidism, COPD, schizoaffective disorder who presents to the clinic today for hospital follow-up.   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 On the Wechsler Adult Intelligence Scale-4, Ms. Teresa Murillo achieved a full-scale IQ score of 73 and a percentile rank of 4 placing her in the borderline range of intellectual functioning.    Chronic obstructive pulmonary disease (COPD) (HCC)    Glaucoma    Hyperlipidemia    Hypertension    Iron deficiency    Schizoaffective disorder (HCC)    Type 2 diabetes mellitus (HCC)    Past Surgical History:  Procedure Laterality Date   TUBAL LIGATION      Allergies  Allergies  Allergen Reactions   Penicillins Other (See Comments)    Reaction not known at group home   Penicillin G Other (See Comments)    History of Present Illness    Teresa Murillo has a past medical history of: PAF/aflutter.  Pericardial effusion.  Echo 09/10/2021: EF 60-65%. Mild concentric LVH. Grade I DD. Moderately dilated left atrium. Moderate MAC. Moderate aortic valve calcification. Moderate pericardial effusion, circumferential. No tamponade present.  Echo 11/09/2021: EF 60-65%. Mild LVH. Grade I DD. Mildly dilated left atrium. Mild MR. Moderate MAC. Mild aortic valve calcification. Small pericardial effusion, circumferential. No tamponade present.  Echo (limited) 02/09/2022: Moderate pericardial effusion, circumferential. Patient did not complete study.  Hypertension.  Hyperlipidemia.  Lipid panel  11/23/2021: LDL 81, HDL 65, TG 125, total 171. T2DM.  COPD.  Schizoaffective disorder.     Home Medications    No outpatient medications have been marked as taking for the 03/05/22 encounter (Appointment) with Deberah Pelton, NP.    Family History    Family History  Problem Relation Age of Onset   Breast cancer Maternal Grandmother    She indicated that the status of her maternal grandmother is unknown.   Social History    Social History   Socioeconomic History   Marital status: Divorced    Spouse name: Not on file   Number of children: Not on file   Years of education: Not on file   Highest education level: Not on file  Occupational History   Not on file  Tobacco Use   Smoking status: Former    Packs/day: 1.00    Types: Cigars, Cigarettes    Quit date: 11/09/2021    Years since quitting: 0.3   Smokeless tobacco: Current  Vaping Use   Vaping Use: Never used  Substance and Sexual Activity   Alcohol use: Not Currently   Drug use: Not Currently   Sexual activity: Not Currently    Birth control/protection: Surgical  Other Topics Concern   Not on file  Social History Narrative   Not on file   Social Determinants of Health   Financial Resource Strain: Not on file  Food Insecurity: Not on file  Transportation Needs: Not on file  Physical Activity: Not on file  Stress: Not on file  Social Connections: Not on file  Intimate Partner Violence: Not on file     Review of Systems  General: *** No chills, fever, night sweats or weight changes.  Cardiovascular:  No chest pain, dyspnea on exertion, edema, orthopnea, palpitations, paroxysmal nocturnal dyspnea. Dermatological: No rash, lesions/masses Respiratory: No cough, dyspnea Urologic: No hematuria, dysuria Abdominal:   No nausea, vomiting, diarrhea, bright red blood per rectum, melena, or hematemesis Neurologic:  No visual changes, weakness, changes in mental status. All other systems reviewed and are  otherwise negative except as noted above.  Physical Exam    VS:  There were no vitals taken for this visit. , BMI There is no height or weight on file to calculate BMI. GEN: *** Well nourished, well developed, in no acute distress. HEENT: Normal. Neck: Supple, no JVD, carotid bruits, or masses. Cardiac: RRR, no murmurs, rubs, or gallops. No clubbing, cyanosis, edema.  Radials/DP/PT 2+ and equal bilaterally.  Respiratory:  Respirations regular and unlabored, clear to auscultation bilaterally. GI: Soft, nontender, nondistended. MS: No deformity or atrophy. Skin: Warm and dry, no rash. Neuro: Strength and sensation are intact. Psych: Normal affect.  Accessory Clinical Findings    The following studies were reviewed for this visit: ***  Recent Labs: 11/23/2021: Magnesium 2.0; TSH 1.788 02/08/2022: ALT 20 02/17/2022: B Natriuretic Peptide 30.9; BUN 9; Creatinine, Ser 0.50; Potassium 3.9; Sodium 136 03/02/2022: Hemoglobin 9.7; Platelets 395   Recent Lipid Panel    Component Value Date/Time   CHOL 171 11/23/2021 1820   TRIG 125 11/23/2021 1820   HDL 65 11/23/2021 1820   CHOLHDL 2.6 11/23/2021 1820   VLDL 25 11/23/2021 1820   LDLCALC 81 11/23/2021 1820         ECG personally reviewed by me today: ***  No significant changes from ***  {Does this patient have ATRIAL FIBRILLATION?:[id number]}   Assessment & Plan   ***      Disposition: ***   Teresa Murillo. Teresa Furches, DNP, NP-C     03/04/2022, 7:53 AM Teresa Murillo [address] Office [phone] Fax [phone]

## 2022-03-04 NOTE — ED Notes (Signed)
Pt A&O x 4, no distress noted., calm & cooperative, monitoring for safety.

## 2022-03-04 NOTE — ED Notes (Addendum)
Pt is currently lying in bed quietly, no distress noted, environmental check complete, will continue to monitor patient for safety.

## 2022-03-04 NOTE — ED Notes (Signed)
Pt sleeping at present, no distress noted.  Monitoring for safety. 

## 2022-03-04 NOTE — ED Notes (Signed)
Patient's blood sugar is 104

## 2022-03-04 NOTE — ED Notes (Signed)
Patient has eaten she is sitting in bed quietly, no distress noted.

## 2022-03-04 NOTE — ED Provider Notes (Signed)
Behavioral Health Progress Note  Date and Time: 03/04/2022 8:55 AM Name: Teresa Murillo MRN:  [id number]  Subjective: Patient states "I am so happy, I think I am getting out of here on January 15."  Teresa Murillo is reassessed, face-to-face, by nurse practitioner.  She is reclined in observation area upon my approach.  She is alert and oriented, pleasant and cooperative during assessment.  She presents with euthymic mood, bright affect.  [name] reports she is looking forward to discharge.  She remains pleasant and appropriate, engaged with staff and peers.  She is compliant with medications.  She endorses average sleep and appetite.  No physical complaints.  Per MEDICAL RECORD NUMBERHolly Armstead is a 62 y.o. female with PMH of schizophrenia, borderline intellectual functioning, tobacco use d/o, HTN, HLD, DM2 (insulin-dependent), atrial flutter, COPD, admitted to  [facility] on 11/23/21 voluntarily, accompanied by [employer], and reported that she was locked out of her group home. It was later confirmed that Teresa Murillo was dismissed from group home due to recurrent history of eloping from the group home. Patient was evaluated here subsequently discharged from [facility], however has remained at this facility as a boarder while DSS continues efforts to find permanent placement for Teresa Murillo.     Diagnosis:  Final diagnoses:  Schizophrenia, unspecified type ([place])    Total Time spent with patient: 30 minutes  Past Psychiatric History: see above Past Medical History:  Past Medical History:  Diagnosis Date   Borderline intellectual functioning 12/14/2021   On 12/14/2021: Appreciate assistance from psychology consult. On the Wechsler Adult Intelligence Scale-4, Ms. Cecilio achieved a full-scale IQ score of 73 and a percentile rank of 4 placing her in the borderline range of intellectual functioning.    Chronic obstructive pulmonary disease (COPD) (HCC)    Glaucoma    Hyperlipidemia    Hypertension    Iron deficiency     Schizoaffective disorder (HCC)    Type 2 diabetes mellitus (HCC)     Past Surgical History:  Procedure Laterality Date   TUBAL LIGATION     Family History:  Family History  Problem Relation Age of Onset   Breast cancer Maternal Grandmother    Family Psychiatric  History: none reported Social History:  Social History   Substance and Sexual Activity  Alcohol Use Not Currently     Social History   Substance and Sexual Activity  Drug Use Not Currently    Social History   Socioeconomic History   Marital status: Divorced    Spouse name: Not on file   Number of children: Not on file   Years of education: Not on file   Highest education level: Not on file  Occupational History   Not on file  Tobacco Use   Smoking status: Former    Packs/day: 1.00    Types: Cigars, Cigarettes    Quit date: 11/09/2021    Years since quitting: 0.3   Smokeless tobacco: Current  Vaping Use   Vaping Use: Never used  Substance and Sexual Activity   Alcohol use: Not Currently   Drug use: Not Currently   Sexual activity: Not Currently    Birth control/protection: Surgical  Other Topics Concern   Not on file  Social History Narrative   Not on file   Social Determinants of Health   Financial Resource Strain: Not on file  Food Insecurity: Not on file  Transportation Needs: Not on file  Physical Activity: Not on file  Stress: Not on file  Social Connections: Not  on file   SDOH:  SDOH Screenings   Depression (PHQ2-9): Low Risk  (11/23/2021)  Tobacco Use: High Risk (02/17/2022)   Additional Social History:                         Sleep: Good  Appetite:  Good  Current Medications:  Current Facility-Administered Medications  Medication Dose Route Frequency Provider Last Rate Last Admin   acetaminophen (TYLENOL) tablet 650 mg  650 mg Oral Q6H PRN [name], NP   650 mg at 03/03/22 2130   albuterol (VENTOLIN HFA) 108 (90 Base) MCG/ACT inhaler 2 puff  2 puff Inhalation  Q6H PRN [name], NP   2 puff at 02/20/22 1944   alum & mag hydroxide-simeth (MAALOX/MYLANTA) 200-200-20 MG/5ML suspension 30 mL  30 mL Oral Q4H PRN [name], NP   30 mL at 02/28/22 0934   apixaban (ELIQUIS) tablet 5 mg  5 mg Oral BID [name], NP   5 mg at 03/03/22 2131   ARIPiprazole (ABILIFY) tablet 10 mg  10 mg Oral Daily [name], NP   10 mg at 03/03/22 0912   cloZAPine (CLOZARIL) tablet 75 mg  75 mg Oral BID France Ravens, MD   75 mg at 03/03/22 2131   colchicine tablet 0.6 mg  0.6 mg Oral QHS [name], NP   0.6 mg at 03/03/22 2130   diltiazem (CARDIZEM CD) 24 hr capsule 240 mg  240 mg Oral Daily [name], NP   240 mg at 03/03/22 0912   divalproex (DEPAKOTE ER) 24 hr tablet 500 mg  500 mg Oral BID [name], NP   500 mg at 03/03/22 2130   haloperidol (HALDOL) tablet 10 mg  10 mg Oral TID PRN [name], NP       insulin aspart (novoLOG) injection 0-9 Units  0-9 Units Subcutaneous TID WC France Ravens, MD   1 Units at 03/03/22 1201   insulin glargine-yfgn (SEMGLEE) injection 12 Units  12 Units Subcutaneous Daily [name], NP   12 Units at 03/03/22 0918   latanoprost (XALATAN) 0.005 % ophthalmic solution 1 drop  1 drop Both Eyes QHS [name], NP   1 drop at 03/03/22 2131   magnesium hydroxide (MILK OF MAGNESIA) suspension 30 mL  30 mL Oral Daily PRN [name], NP       metFORMIN (GLUCOPHAGE) tablet 500 mg  500 mg Oral BID WC [name], NP   500 mg at 03/04/22 4098   mometasone-formoterol (DULERA) 200-5 MCG/ACT inhaler 2 puff  2 puff Inhalation BID [name], NP   2 puff at 03/04/22 0809   multivitamins with iron tablet 1 tablet  1 tablet Oral QHS [name], DO   1 tablet at 03/03/22 2131   nicotine (NICODERM CQ - dosed in mg/24 hr) patch 7 mg  7 mg Transdermal Daily [name], NP   7 mg at 03/03/22 0914   rosuvastatin (CRESTOR) tablet 20 mg  20 mg Oral QHS [name], DO   20 mg at 03/03/22 2130   valbenazine (INGREZZA) capsule 40  mg  40 mg Oral q AM [name], NP   40 mg at 03/04/22 1191   Vitamin D (Ergocalciferol) (DRISDOL) 1.25 MG (50000 UNIT) capsule 50,000 Units  50,000 Units Oral Q Leonie Green, NP   50,000 Units at 02/25/22 4782   Current Outpatient Medications  Medication Sig Dispense Refill   Accu-Chek Softclix Lancets lancets Use as directed up  to four times daily 100 each 0   apixaban (ELIQUIS) 5 MG TABS tablet Take 1 tablet (5 mg total) by mouth 2 (two) times daily. 60 tablet 0   ARIPiprazole (ABILIFY) 10 MG tablet Take 1 tablet (10 mg total) by mouth daily. 30 tablet 0   Blood Glucose Monitoring Suppl (ACCU-CHEK GUIDE) w/Device KIT Use as directed up to four times daily 1 kit 0   budesonide-formoterol (SYMBICORT) 160-4.5 MCG/ACT inhaler Inhale 2 puffs into the lungs in the morning and at bedtime.     Cholecalciferol (VITAMIN D3) 1.25 MG (50000 UT) CAPS Take 50,000 Units by mouth every Thursday.     clozapine (CLOZARIL) 50 MG tablet Take 1 tablet (50 mg total) by mouth daily. 30 tablet 0   colchicine 0.6 MG tablet Take 1 tablet (0.6 mg total) by mouth at bedtime. 30 tablet 0   diltiazem (CARDIZEM CD) 240 MG 24 hr capsule Take 1 capsule (240 mg total) by mouth daily. (Patient not taking: Reported on 11/24/2021) 30 capsule 0   divalproex (DEPAKOTE ER) 500 MG 24 hr tablet Take 1 tablet (500 mg total) by mouth 2 (two) times daily. 60 tablet 0   glucose blood test strip Use as directed up to four times daily 50 each 0   haloperidol (HALDOL) 10 MG tablet Take 1 tablet (10 mg total) by mouth 3 (three) times daily as needed for agitation (and psychotic symptoms).     INGREZZA 40 MG capsule Take 1 capsule (40 mg total) by mouth in the morning. 30 capsule 0   insulin aspart (NOVOLOG) 100 UNIT/ML FlexPen Before each meal 3 times a day, 140-199 - 2 units, 200-250 - 4 units, 251-299 - 6 units,  300-349 - 8 units,  350 or above 10 units. Insulin PEN if approved, provide syringes and needles if needed.Please switch to  any approved short acting Insulin if needed. 15 mL 0   insulin glargine (LANTUS) 100 UNIT/ML Solostar Pen Inject 12 Units into the skin daily. 15 mL 0   Insulin Pen Needle 32G X 4 MM MISC Use 4 times a day with insulin, 1 month supply. 100 each 0   latanoprost (XALATAN) 0.005 % ophthalmic solution Place 1 drop into both eyes at bedtime.     metFORMIN (GLUCOPHAGE) 500 MG tablet Take 500 mg by mouth 2 (two) times daily with a meal.     nicotine (NICODERM CQ - DOSED IN MG/24 HR) 7 mg/24hr patch Place 1 patch (7 mg total) onto the skin daily. 28 patch 0   PROAIR HFA 108 (90 Base) MCG/ACT inhaler Inhale 2 puffs into the lungs every 6 (six) hours as needed for wheezing or shortness of breath.      Labs  Lab Results:  Admission on 02/18/2022  Component Date Value Ref Range Status   Glucose-Capillary 02/18/2022 254 (H)  70 - 99 mg/dL Final   Glucose reference range applies only to samples taken after fasting for at least 8 hours.   Glucose-Capillary 02/18/2022 112 (H)  70 - 99 mg/dL Final   Glucose reference range applies only to samples taken after fasting for at least 8 hours.   Glucose-Capillary 02/19/2022 159 (H)  70 - 99 mg/dL Final   Glucose reference range applies only to samples taken after fasting for at least 8 hours.   Glucose-Capillary 02/19/2022 160 (H)  70 - 99 mg/dL Final   Glucose reference range applies only to samples taken after fasting for at least 8 hours.   Glucose-Capillary  02/19/2022 177 (H)  70 - 99 mg/dL Final   Glucose reference range applies only to samples taken after fasting for at least 8 hours.   Glucose-Capillary 02/19/2022 147 (H)  70 - 99 mg/dL Final   Glucose reference range applies only to samples taken after fasting for at least 8 hours.   Glucose-Capillary 02/20/2022 126 (H)  70 - 99 mg/dL Final   Glucose reference range applies only to samples taken after fasting for at least 8 hours.   Glucose-Capillary 02/20/2022 132 (H)  70 - 99 mg/dL Final   Glucose  reference range applies only to samples taken after fasting for at least 8 hours.   Glucose-Capillary 02/20/2022 125 (H)  70 - 99 mg/dL Final   Glucose reference range applies only to samples taken after fasting for at least 8 hours.   Glucose-Capillary 02/21/2022 151 (H)  70 - 99 mg/dL Final   Glucose reference range applies only to samples taken after fasting for at least 8 hours.   Glucose-Capillary 02/21/2022 106 (H)  70 - 99 mg/dL Final   Glucose reference range applies only to samples taken after fasting for at least 8 hours.   Glucose-Capillary 02/21/2022 149 (H)  70 - 99 mg/dL Final   Glucose reference range applies only to samples taken after fasting for at least 8 hours.   Glucose-Capillary 02/22/2022 131 (H)  70 - 99 mg/dL Final   Glucose reference range applies only to samples taken after fasting for at least 8 hours.   Glucose-Capillary 02/22/2022 141 (H)  70 - 99 mg/dL Final   Glucose reference range applies only to samples taken after fasting for at least 8 hours.   Glucose-Capillary 02/22/2022 183 (H)  70 - 99 mg/dL Final   Glucose reference range applies only to samples taken after fasting for at least 8 hours.   Glucose-Capillary 02/23/2022 133 (H)  70 - 99 mg/dL Final   Glucose reference range applies only to samples taken after fasting for at least 8 hours.   WBC 02/23/2022 4.8  4.0 - 10.5 K/uL Final   RBC 02/23/2022 3.96  3.87 - 5.11 MIL/uL Final   Hemoglobin 02/23/2022 9.7 (L)  12.0 - 15.0 g/dL Final   HCT 02/23/2022 30.6 (L)  36.0 - 46.0 % Final   MCV 02/23/2022 77.3 (L)  80.0 - 100.0 fL Final   MCH 02/23/2022 24.5 (L)  26.0 - 34.0 pg Final   MCHC 02/23/2022 31.7  30.0 - 36.0 g/dL Final   RDW 02/23/2022 16.5 (H)  11.5 - 15.5 % Final   Platelets 02/23/2022 372  150 - 400 K/uL Final   nRBC 02/23/2022 0.0  0.0 - 0.2 % Final   Neutrophils Relative % 02/23/2022 56  % Final   Neutro Abs 02/23/2022 2.7  1.7 - 7.7 K/uL Final   Lymphocytes Relative 02/23/2022 32  % Final    Lymphs Abs 02/23/2022 1.5  0.7 - 4.0 K/uL Final   Monocytes Relative 02/23/2022 9  % Final   Monocytes Absolute 02/23/2022 0.4  0.1 - 1.0 K/uL Final   Eosinophils Relative 02/23/2022 2  % Final   Eosinophils Absolute 02/23/2022 0.1  0.0 - 0.5 K/uL Final   Basophils Relative 02/23/2022 1  % Final   Basophils Absolute 02/23/2022 0.1  0.0 - 0.1 K/uL Final   Immature Granulocytes 02/23/2022 0  % Final   Abs Immature Granulocytes 02/23/2022 0.01  0.00 - 0.07 K/uL Final   Performed at [facility], [place]  [address] [place], [place] [address]   Glucose-Capillary 02/23/2022 202 (H)  70 - 99 mg/dL Final   Glucose reference range applies only to samples taken after fasting for at least 8 hours.   Glucose-Capillary 02/23/2022 143 (H)  70 - 99 mg/dL Final   Glucose reference range applies only to samples taken after fasting for at least 8 hours.   Glucose-Capillary 02/23/2022 143 (H)  70 - 99 mg/dL Final   Glucose reference range applies only to samples taken after fasting for at least 8 hours.   Glucose-Capillary 02/24/2022 207 (H)  70 - 99 mg/dL Final   Glucose reference range applies only to samples taken after fasting for at least 8 hours.   Glucose-Capillary 02/24/2022 109 (H)  70 - 99 mg/dL Final   Glucose reference range applies only to samples taken after fasting for at least 8 hours.   Glucose-Capillary 02/24/2022 127 (H)  70 - 99 mg/dL Final   Glucose reference range applies only to samples taken after fasting for at least 8 hours.   Glucose-Capillary 02/24/2022 130 (H)  70 - 99 mg/dL Final   Glucose reference range applies only to samples taken after fasting for at least 8 hours.   Glucose-Capillary 02/25/2022 117 (H)  70 - 99 mg/dL Final   Glucose reference range applies only to samples taken after fasting for at least 8 hours.   Glucose-Capillary 02/25/2022 104 (H)  70 - 99 mg/dL Final   Glucose reference range applies only to samples taken after fasting for at least 8 hours.    Glucose-Capillary 02/25/2022 180 (H)  70 - 99 mg/dL Final   Glucose reference range applies only to samples taken after fasting for at least 8 hours.   Glucose-Capillary 02/25/2022 180 (H)  70 - 99 mg/dL Final   Glucose reference range applies only to samples taken after fasting for at least 8 hours.   Glucose-Capillary 02/26/2022 133 (H)  70 - 99 mg/dL Final   Glucose reference range applies only to samples taken after fasting for at least 8 hours.   Glucose-Capillary 02/26/2022 85  70 - 99 mg/dL Final   Glucose reference range applies only to samples taken after fasting for at least 8 hours.   Glucose-Capillary 02/26/2022 136 (H)  70 - 99 mg/dL Final   Glucose reference range applies only to samples taken after fasting for at least 8 hours.   Glucose-Capillary 02/26/2022 170 (H)  70 - 99 mg/dL Final   Glucose reference range applies only to samples taken after fasting for at least 8 hours.   Glucose-Capillary 02/26/2022 185 (H)  70 - 99 mg/dL Final   Glucose reference range applies only to samples taken after fasting for at least 8 hours.   Glucose-Capillary 02/27/2022 124 (H)  70 - 99 mg/dL Final   Glucose reference range applies only to samples taken after fasting for at least 8 hours.   Glucose-Capillary 02/27/2022 161 (H)  70 - 99 mg/dL Final   Glucose reference range applies only to samples taken after fasting for at least 8 hours.   Glucose-Capillary 02/27/2022 170 (H)  70 - 99 mg/dL Final   Glucose reference range applies only to samples taken after fasting for at least 8 hours.   Glucose-Capillary 02/27/2022 154 (H)  70 - 99 mg/dL Final   Glucose reference range applies only to samples taken after fasting for at least 8 hours.   Glucose-Capillary 02/28/2022 109 (H)  70 - 99 mg/dL Final   Glucose  reference range applies only to samples taken after fasting for at least 8 hours.   Glucose-Capillary 02/28/2022 134 (H)  70 - 99 mg/dL Final   Glucose reference range applies only to  samples taken after fasting for at least 8 hours.   Glucose-Capillary 02/28/2022 160 (H)  70 - 99 mg/dL Final   Glucose reference range applies only to samples taken after fasting for at least 8 hours.   Glucose-Capillary 02/28/2022 136 (H)  70 - 99 mg/dL Final   Glucose reference range applies only to samples taken after fasting for at least 8 hours.   Glucose-Capillary 02/28/2022 173 (H)  70 - 99 mg/dL Final   Glucose reference range applies only to samples taken after fasting for at least 8 hours.   Glucose-Capillary 03/01/2022 134 (H)  70 - 99 mg/dL Final   Glucose reference range applies only to samples taken after fasting for at least 8 hours.   Glucose-Capillary 03/01/2022 116 (H)  70 - 99 mg/dL Final   Glucose reference range applies only to samples taken after fasting for at least 8 hours.   Glucose-Capillary 03/01/2022 227 (H)  70 - 99 mg/dL Final   Glucose reference range applies only to samples taken after fasting for at least 8 hours.   Glucose-Capillary 03/01/2022 157 (H)  70 - 99 mg/dL Final   Glucose reference range applies only to samples taken after fasting for at least 8 hours.   Glucose-Capillary 03/02/2022 99  70 - 99 mg/dL Final   Glucose reference range applies only to samples taken after fasting for at least 8 hours.   WBC 03/02/2022 6.0  4.0 - 10.5 K/uL Final   RBC 03/02/2022 4.15  3.87 - 5.11 MIL/uL Final   Hemoglobin 03/02/2022 9.7 (L)  12.0 - 15.0 g/dL Final   HCT 03/02/2022 31.5 (L)  36.0 - 46.0 % Final   MCV 03/02/2022 75.9 (L)  80.0 - 100.0 fL Final   MCH 03/02/2022 23.4 (L)  26.0 - 34.0 pg Final   MCHC 03/02/2022 30.8  30.0 - 36.0 g/dL Final   RDW 03/02/2022 16.4 (H)  11.5 - 15.5 % Final   Platelets 03/02/2022 395  150 - 400 K/uL Final   nRBC 03/02/2022 0.0  0.0 - 0.2 % Final   Neutrophils Relative % 03/02/2022 61  % Final   Neutro Abs 03/02/2022 3.6  1.7 - 7.7 K/uL Final   Lymphocytes Relative 03/02/2022 29  % Final   Lymphs Abs 03/02/2022 1.7  0.7 - 4.0  K/uL Final   Monocytes Relative 03/02/2022 8  % Final   Monocytes Absolute 03/02/2022 0.5  0.1 - 1.0 K/uL Final   Eosinophils Relative 03/02/2022 1  % Final   Eosinophils Absolute 03/02/2022 0.1  0.0 - 0.5 K/uL Final   Basophils Relative 03/02/2022 1  % Final   Basophils Absolute 03/02/2022 0.1  0.0 - 0.1 K/uL Final   Immature Granulocytes 03/02/2022 0  % Final   Abs Immature Granulocytes 03/02/2022 0.01  0.00 - 0.07 K/uL Final   Performed at [facility], [place] [address], [place], [place] [address]   Glucose-Capillary 03/02/2022 171 (H)  70 - 99 mg/dL Final   Glucose reference range applies only to samples taken after fasting for at least 8 hours.   Glucose-Capillary 03/02/2022 108 (H)  70 - 99 mg/dL Final   Glucose reference range applies only to samples taken after fasting for at least 8 hours.   Glucose-Capillary 03/02/2022 203 (H)  70 -  99 mg/dL Final   Glucose reference range applies only to samples taken after fasting for at least 8 hours.   Glucose-Capillary 03/03/2022 128 (H)  70 - 99 mg/dL Final   Glucose reference range applies only to samples taken after fasting for at least 8 hours.   Glucose-Capillary 03/03/2022 134 (H)  70 - 99 mg/dL Final   Glucose reference range applies only to samples taken after fasting for at least 8 hours.   Glucose-Capillary 03/03/2022 100 (H)  70 - 99 mg/dL Final   Glucose reference range applies only to samples taken after fasting for at least 8 hours.   Glucose-Capillary 03/03/2022 160 (H)  70 - 99 mg/dL Final   Glucose reference range applies only to samples taken after fasting for at least 8 hours.   Glucose-Capillary 03/04/2022 116 (H)  70 - 99 mg/dL Final   Glucose reference range applies only to samples taken after fasting for at least 8 hours.  Admission on 02/17/2022, Discharged on 02/18/2022  Component Date Value Ref Range Status   Sodium 02/17/2022 136  135 - 145 mmol/L Final   Potassium 02/17/2022 3.9  3.5 - 5.1 mmol/L Final    Chloride 02/17/2022 101  98 - 111 mmol/L Final   CO2 02/17/2022 25  22 - 32 mmol/L Final   Glucose, Bld 02/17/2022 113 (H)  70 - 99 mg/dL Final   Glucose reference range applies only to samples taken after fasting for at least 8 hours.   BUN 02/17/2022 9  6 - 20 mg/dL Final   Creatinine, Ser 02/17/2022 0.50  0.44 - 1.00 mg/dL Final   Calcium 02/17/2022 8.9  8.9 - 10.3 mg/dL Final   GFR, Estimated 02/17/2022 >60  >60 mL/min Final   Comment: (NOTE) Calculated using the CKD-EPI Creatinine Equation (2021)    Anion gap 02/17/2022 10  5 - 15 Final   Performed at [facility], [place] [address] [place], [place] [address]   WBC 02/17/2022 6.5  4.0 - 10.5 K/uL Final   RBC 02/17/2022 4.24  3.87 - 5.11 MIL/uL Final   Hemoglobin 02/17/2022 10.2 (L)  12.0 - 15.0 g/dL Final   HCT 02/17/2022 33.2 (L)  36.0 - 46.0 % Final   MCV 02/17/2022 78.3 (L)  80.0 - 100.0 fL Final   MCH 02/17/2022 24.1 (L)  26.0 - 34.0 pg Final   MCHC 02/17/2022 30.7  30.0 - 36.0 g/dL Final   RDW 02/17/2022 17.2 (H)  11.5 - 15.5 % Final   Platelets 02/17/2022 390  150 - 400 K/uL Final   nRBC 02/17/2022 0.0  0.0 - 0.2 % Final   Performed at [facility], [place] [address] [place], [place] [address]   Troponin I (High Sensitivity) 02/17/2022 4  <18 ng/L Final   Comment: (NOTE) Elevated high sensitivity troponin I (hsTnI) values and significant  changes across serial measurements may suggest ACS but many other  chronic and acute conditions are known to elevate hsTnI results.  Refer to the "Links" section for chest pain algorithms and additional  guidance. Performed at [facility], [place] [address], [place], [place] [address]    Valproic Acid Lvl 02/17/2022 17 (L)  50.0 - 100.0 ug/mL Final   Performed at [place] [address], [place], [place] [address]   B Natriuretic Peptide 02/17/2022 30.9  0.0 - 100.0 pg/mL Final   Performed at [facility] [address] [place],  [address]  Troponin I (High Sensitivity) 02/18/2022 4  <18 ng/L Final   Comment: (NOTE) Elevated high sensitivity troponin I (hsTnI) values and significant  changes across serial measurements may suggest ACS but many other  chronic and acute conditions are known to elevate hsTnI results.  Refer to the "Links" section for chest pain algorithms and additional  guidance. Performed at [facility], [place] [address] [place], [place] [address]    D-Dimer, Quant 02/18/2022 0.29  0.00 - 0.50 ug/mL-FEU Final   Comment: (NOTE) At the manufacturer cut-off value of 0.5 g/mL FEU, this assay has a negative predictive value of 95-100%.This assay is intended for use in conjunction with a clinical pretest probability (PTP) assessment model to exclude pulmonary embolism (PE) and deep venous thrombosis (DVT) in outpatients suspected of PE or DVT. Results should be correlated with clinical presentation. Performed at [facility], [place] [address] [place],  [address]   Admission on 02/09/2022, Discharged on 02/17/2022  Component Date Value Ref Range Status   Glucose-Capillary 02/09/2022 118 (H)  70 - 99 mg/dL Final   Glucose reference range applies only to samples taken after fasting for at least 8 hours.   Glucose-Capillary 02/10/2022 148 (H)  70 - 99 mg/dL Final   Glucose reference range applies only to samples taken after fasting for at least 8 hours.   Glucose-Capillary 02/10/2022 174 (H)  70 - 99 mg/dL Final   Glucose reference range applies only to samples taken after fasting for at least 8 hours.   Glucose-Capillary 02/10/2022 128 (H)  70 - 99 mg/dL Final   Glucose reference range applies only to samples taken after fasting for at least 8 hours.   Glucose-Capillary 02/10/2022 182 (H)  70 - 99 mg/dL Final   Glucose reference range applies only to samples taken after fasting for at least 8 hours.   Glucose-Capillary 02/11/2022 158 (H)  70 - 99 mg/dL Final   Glucose reference range applies  only to samples taken after fasting for at least 8 hours.   Glucose-Capillary 02/11/2022 159 (H)  70 - 99 mg/dL Final   Glucose reference range applies only to samples taken after fasting for at least 8 hours.   Glucose-Capillary 02/11/2022 153 (H)  70 - 99 mg/dL Final   Glucose reference range applies only to samples taken after fasting for at least 8 hours.   Glucose-Capillary 02/11/2022 159 (H)  70 - 99 mg/dL Final   Glucose reference range applies only to samples taken after fasting for at least 8 hours.   Glucose-Capillary 02/11/2022 153 (H)  70 - 99 mg/dL Final   Glucose reference range applies only to samples taken after fasting for at least 8 hours.   Glucose-Capillary 02/11/2022 109 (H)  70 - 99 mg/dL Final   Glucose reference range applies only to samples taken after fasting for at least 8 hours.   Glucose-Capillary 02/11/2022 213 (H)  70 - 99 mg/dL Final   Glucose reference range applies only to samples taken after fasting for at least 8 hours.   Glucose-Capillary 02/11/2022 229 (H)  70 - 99 mg/dL Final   Glucose reference range applies only to samples taken after fasting for at least 8 hours.   Glucose-Capillary 02/12/2022 128 (H)  70 - 99 mg/dL Final   Glucose reference range applies only to samples taken after fasting for at least 8 hours.   Glucose-Capillary 02/12/2022 149 (H)  70 - 99 mg/dL Final   Glucose reference range applies only  to samples taken after fasting for at least 8 hours.   Color, Urine 02/12/2022 YELLOW  YELLOW Final   APPearance 02/12/2022 CLEAR  CLEAR Final   Specific Gravity, Urine 02/12/2022 1.015  1.005 - 1.030 Final   pH 02/12/2022 5.0  5.0 - 8.0 Final   Glucose, UA 02/12/2022 NEGATIVE  NEGATIVE mg/dL Final   Hgb urine dipstick 02/12/2022 NEGATIVE  NEGATIVE Final   Bilirubin Urine 02/12/2022 NEGATIVE  NEGATIVE Final   Ketones, ur 02/12/2022 NEGATIVE  NEGATIVE mg/dL Final   Protein, ur 02/12/2022 NEGATIVE  NEGATIVE mg/dL Final   Nitrite 02/12/2022  NEGATIVE  NEGATIVE Final   Leukocytes,Ua 02/12/2022 NEGATIVE  NEGATIVE Final   Performed at [facility], [place] [address] [place], [place] [address]   Specimen Description 02/12/2022 URINE, CLEAN CATCH   Final   Special Requests 02/12/2022    Final                   Value:NONE Performed at [facility], [place] [address] [place], [place] [address]    Culture 02/12/2022 10,000 COLONIES/mL PROTEUS MIRABILIS (A)   Final   Report Status 02/12/2022 02/15/2022 FINAL   Final   Organism ID, Bacteria 02/12/2022 PROTEUS MIRABILIS (A)   Final   Glucose-Capillary 02/12/2022 128 (H)  70 - 99 mg/dL Final   Glucose reference range applies only to samples taken after fasting for at least 8 hours.   Glucose-Capillary 02/12/2022 196 (H)  70 - 99 mg/dL Final   Glucose reference range applies only to samples taken after fasting for at least 8 hours.   Glucose-Capillary 02/13/2022 168 (H)  70 - 99 mg/dL Final   Glucose reference range applies only to samples taken after fasting for at least 8 hours.   Glucose-Capillary 02/13/2022 153 (H)  70 - 99 mg/dL Final   Glucose reference range applies only to samples taken after fasting for at least 8 hours.   Glucose-Capillary 02/13/2022 134 (H)  70 - 99 mg/dL Final   Glucose reference range applies only to samples taken after fasting for at least 8 hours.   Glucose-Capillary 02/13/2022 178 (H)  70 - 99 mg/dL Final   Glucose reference range applies only to samples taken after fasting for at least 8 hours.   Glucose-Capillary 02/14/2022 156 (H)  70 - 99 mg/dL Final   Glucose reference range applies only to samples taken after fasting for at least 8 hours.   Glucose-Capillary 02/14/2022 169 (H)  70 - 99 mg/dL Final   Glucose reference range applies only to samples taken after fasting for at least 8 hours.   Glucose-Capillary 02/14/2022 156 (H)  70 - 99 mg/dL Final   Glucose reference range applies only to samples taken after fasting for at least 8 hours.    Glucose-Capillary 02/14/2022 148 (H)  70 - 99 mg/dL Final   Glucose reference range applies only to samples taken after fasting for at least 8 hours.   Glucose-Capillary 02/15/2022 159 (H)  70 - 99 mg/dL Final   Glucose reference range applies only to samples taken after fasting for at least 8 hours.   Glucose-Capillary 02/15/2022 125 (H)  70 - 99 mg/dL Final   Glucose reference range applies only to samples taken after fasting for at least 8 hours.   Glucose-Capillary 02/15/2022 142 (H)  70 - 99 mg/dL Final   Glucose reference range applies only to samples taken after fasting for at least 8 hours.   Glucose-Capillary 02/15/2022 128 (  H)  70 - 99 mg/dL Final   Glucose reference range applies only to samples taken after fasting for at least 8 hours.   Glucose-Capillary 02/16/2022 137 (H)  70 - 99 mg/dL Final   Glucose reference range applies only to samples taken after fasting for at least 8 hours.   WBC 02/16/2022 6.0  4.0 - 10.5 K/uL Final   RBC 02/16/2022 4.48  3.87 - 5.11 MIL/uL Final   Hemoglobin 02/16/2022 10.7 (L)  12.0 - 15.0 g/dL Final   HCT 02/16/2022 35.4 (L)  36.0 - 46.0 % Final   MCV 02/16/2022 79.0 (L)  80.0 - 100.0 fL Final   MCH 02/16/2022 23.9 (L)  26.0 - 34.0 pg Final   MCHC 02/16/2022 30.2  30.0 - 36.0 g/dL Final   RDW 02/16/2022 17.0 (H)  11.5 - 15.5 % Final   Platelets 02/16/2022 387  150 - 400 K/uL Final   nRBC 02/16/2022 0.0  0.0 - 0.2 % Final   Neutrophils Relative % 02/16/2022 54  % Final   Neutro Abs 02/16/2022 3.2  1.7 - 7.7 K/uL Final   Lymphocytes Relative 02/16/2022 35  % Final   Lymphs Abs 02/16/2022 2.1  0.7 - 4.0 K/uL Final   Monocytes Relative 02/16/2022 6  % Final   Monocytes Absolute 02/16/2022 0.4  0.1 - 1.0 K/uL Final   Eosinophils Relative 02/16/2022 2  % Final   Eosinophils Absolute 02/16/2022 0.1  0.0 - 0.5 K/uL Final   Basophils Relative 02/16/2022 2  % Final   Basophils Absolute 02/16/2022 0.1  0.0 - 0.1 K/uL Final   Immature Granulocytes  02/16/2022 1  % Final   Abs Immature Granulocytes 02/16/2022 0.04  0.00 - 0.07 K/uL Final   Performed at [facility], [place] [address] [place], [place] [address]   Glucose-Capillary 02/16/2022 131 (H)  70 - 99 mg/dL Final   Glucose reference range applies only to samples taken after fasting for at least 8 hours.   Glucose-Capillary 02/16/2022 123 (H)  70 - 99 mg/dL Final   Glucose reference range applies only to samples taken after fasting for at least 8 hours.   Glucose-Capillary 02/16/2022 195 (H)  70 - 99 mg/dL Final   Glucose reference range applies only to samples taken after fasting for at least 8 hours.   Glucose-Capillary 02/17/2022 148 (H)  70 - 99 mg/dL Final   Glucose reference range applies only to samples taken after fasting for at least 8 hours.  Admission on 02/08/2022, Discharged on 02/09/2022  Component Date Value Ref Range Status   B Natriuretic Peptide 02/08/2022 40.2  0.0 - 100.0 pg/mL Final   Performed at [facility], [address], [place], [place] [address]   Troponin I (High Sensitivity) 02/08/2022 4  <18 ng/L Final   Comment: (NOTE) Elevated high sensitivity troponin I (hsTnI) values and significant  changes across serial measurements may suggest ACS but many other  chronic and acute conditions are known to elevate hsTnI results.  Refer to the "Links" section for chest pain algorithms and additional  guidance. Performed at [facility], [place] [address] [place], [place] [address]    WBC 02/08/2022 8.5  4.0 - 10.5 K/uL Final   RBC 02/08/2022 4.30  3.87 - 5.11 MIL/uL Final   Hemoglobin 02/08/2022 10.7 (L)  12.0 - 15.0 g/dL Final   HCT 02/08/2022 33.1 (L)  36.0 - 46.0 % Final   MCV 02/08/2022 77.0 (L)  80.0 -  100.0 fL Final   MCH 02/08/2022 24.9 (L)  26.0 - 34.0 pg Final   MCHC 02/08/2022 32.3  30.0 - 36.0 g/dL Final   RDW 02/08/2022 16.5 (H)  11.5 - 15.5 % Final   Platelets 02/08/2022 361  150 - 400 K/uL Final   nRBC 02/08/2022 0.0  0.0 -  0.2 % Final   Performed at [facility], [place] [address] [place], [place] [address]   Sodium 02/08/2022 131 (L)  135 - 145 mmol/L Final   Potassium 02/08/2022 4.4  3.5 - 5.1 mmol/L Final   Chloride 02/08/2022 96 (L)  98 - 111 mmol/L Final   CO2 02/08/2022 25  22 - 32 mmol/L Final   Glucose, Bld 02/08/2022 126 (H)  70 - 99 mg/dL Final   Glucose reference range applies only to samples taken after fasting for at least 8 hours.   BUN 02/08/2022 11  6 - 20 mg/dL Final   Creatinine, Ser 02/08/2022 0.52  0.44 - 1.00 mg/dL Final   Calcium 02/08/2022 9.4  8.9 - 10.3 mg/dL Final   Total Protein 02/08/2022 7.5  6.5 - 8.1 g/dL Final   Albumin 02/08/2022 3.6  3.5 - 5.0 g/dL Final   AST 02/08/2022 22  15 - 41 U/L Final   ALT 02/08/2022 20  0 - 44 U/L Final   Alkaline Phosphatase 02/08/2022 67  38 - 126 U/L Final   Total Bilirubin 02/08/2022 0.1 (L)  0.3 - 1.2 mg/dL Final   GFR, Estimated 02/08/2022 >60  >60 mL/min Final   Comment: (NOTE) Calculated using the CKD-EPI Creatinine Equation (2021)    Anion gap 02/08/2022 10  5 - 15 Final   Performed at [facility] [address], [place], [place] [address]   Troponin I (High Sensitivity) 02/08/2022 5  <18 ng/L Final   Comment: (NOTE) Elevated high sensitivity troponin I (hsTnI) values and significant  changes across serial measurements may suggest ACS but many other  chronic and acute conditions are known to elevate hsTnI results.  Refer to the "Links" section for chest pain algorithms and additional  guidance. Performed at [facility], [place] [address], [place], [place] [address]    WBC 02/09/2022 7.3  4.0 - 10.5 K/uL Final   RBC 02/09/2022 4.70  3.87 - 5.11 MIL/uL Final   Hemoglobin 02/09/2022 11.4 (L)  12.0 - 15.0 g/dL Final   HCT 02/09/2022 36.6  36.0 - 46.0 % Final   MCV 02/09/2022 77.9 (L)  80.0 - 100.0 fL Final   MCH 02/09/2022 24.3 (L)  26.0 - 34.0 pg Final   MCHC 02/09/2022 31.1  30.0 - 36.0 g/dL Final   RDW  02/09/2022 16.6 (H)  11.5 - 15.5 % Final   Platelets 02/09/2022 403 (H)  150 - 400 K/uL Final   nRBC 02/09/2022 0.0  0.0 - 0.2 % Final   Performed at [facility] [address] [place], [place] [address]   Glucose-Capillary 02/08/2022 117 (H)  70 - 99 mg/dL Final   Glucose reference range applies only to samples taken after fasting for at least 8 hours.   Sodium 02/09/2022 138  135 - 145 mmol/L Final   Potassium 02/09/2022 3.7  3.5 - 5.1 mmol/L Final   Chloride 02/09/2022 103  98 - 111 mmol/L Final   CO2 02/09/2022 24  22 - 32 mmol/L Final   Glucose, Bld 02/09/2022 134 (H)  70 - 99 mg/dL Final   Glucose reference  range applies only to samples taken after fasting for at least 8 hours.   BUN 02/09/2022 9  6 - 20 mg/dL Final   Creatinine, Ser 02/09/2022 0.44  0.44 - 1.00 mg/dL Final   Calcium 02/09/2022 8.3 (L)  8.9 - 10.3 mg/dL Final   GFR, Estimated 02/09/2022 >60  >60 mL/min Final   Comment: (NOTE) Calculated using the CKD-EPI Creatinine Equation (2021)    Anion gap 02/09/2022 11  5 - 15 Final   Performed at [facility], [place] [address], [place], [place] [address]   Iron 02/09/2022 20 (L)  28 - 170 ug/dL Final   TIBC 02/09/2022 455 (H)  250 - 450 ug/dL Final   Saturation Ratios 02/09/2022 4 (L)  10.4 - 31.8 % Final   UIBC 02/09/2022 435  ug/dL Final   Performed at [facility], [place] [address] [place], [place] [address]   Ferritin 02/09/2022 2 (L)  11 - 307 ng/mL Final   Performed at [facility] [address], [place], [place] [address]   Weight 02/09/2022 2,846.58  oz Final   Height 02/09/2022 62  in Final   BP 02/09/2022 143/80  mmHg Final   WBC 02/09/2022 8.1  4.0 - 10.5 K/uL Final   RBC 02/09/2022 4.38  3.87 - 5.11 MIL/uL Final   Hemoglobin 02/09/2022 10.9 (L)  12.0 - 15.0 g/dL Final   HCT 02/09/2022 33.3 (L)  36.0 - 46.0 % Final   MCV 02/09/2022 76.0 (L)  80.0 - 100.0 fL Final   MCH 02/09/2022 24.9 (L)  26.0 - 34.0 pg Final   MCHC 02/09/2022  32.7  30.0 - 36.0 g/dL Final   RDW 02/09/2022 16.5 (H)  11.5 - 15.5 % Final   Platelets 02/09/2022 397  150 - 400 K/uL Final   nRBC 02/09/2022 0.0  0.0 - 0.2 % Final   Neutrophils Relative % 02/09/2022 66  % Final   Neutro Abs 02/09/2022 5.4  1.7 - 7.7 K/uL Final   Lymphocytes Relative 02/09/2022 24  % Final   Lymphs Abs 02/09/2022 1.9  0.7 - 4.0 K/uL Final   Monocytes Relative 02/09/2022 8  % Final   Monocytes Absolute 02/09/2022 0.6  0.1 - 1.0 K/uL Final   Eosinophils Relative 02/09/2022 1  % Final   Eosinophils Absolute 02/09/2022 0.1  0.0 - 0.5 K/uL Final   Basophils Relative 02/09/2022 1  % Final   Basophils Absolute 02/09/2022 0.1  0.0 - 0.1 K/uL Final   Immature Granulocytes 02/09/2022 0  % Final   Abs Immature Granulocytes 02/09/2022 0.03  0.00 - 0.07 K/uL Final   Performed at [facility], [place] [address] [place], [place] [address]   Glucose-Capillary 02/09/2022 238 (H)  70 - 99 mg/dL Final   Glucose reference range applies only to samples taken after fasting for at least 8 hours.   Glucose-Capillary 02/09/2022 91  70 - 99 mg/dL Final   Glucose reference range applies only to samples taken after fasting for at least 8 hours.   CRP 02/09/2022 <0.5  <1.0 mg/dL Final   Performed at [facility] [address] [place], [place] [address]   Sed Rate 02/09/2022 15  0 - 22 mm/hr Final   Performed at [facility] [address], [place], [place] [address]   Glucose-Capillary 02/09/2022 137 (H)  70 - 99 mg/dL Final   Glucose reference range applies only to samples taken after fasting for at  least 8 hours.  Admission on 01/24/2022, Discharged on 02/08/2022  Component Date Value Ref Range Status   Glucose-Capillary 01/24/2022 143 (H)  70 - 99 mg/dL Final   Glucose reference range applies only to samples taken after fasting for at least 8 hours.   Glucose-Capillary 01/24/2022 120 (H)  70 - 99 mg/dL Final   Glucose reference range applies only to samples taken after  fasting for at least 8 hours.   Glucose-Capillary 01/25/2022 122 (H)  70 - 99 mg/dL Final   Glucose reference range applies only to samples taken after fasting for at least 8 hours.   Glucose-Capillary 01/25/2022 190 (H)  70 - 99 mg/dL Final   Glucose reference range applies only to samples taken after fasting for at least 8 hours.   Glucose-Capillary 01/25/2022 163 (H)  70 - 99 mg/dL Final   Glucose reference range applies only to samples taken after fasting for at least 8 hours.   WBC 01/26/2022 6.5  4.0 - 10.5 K/uL Final   RBC 01/26/2022 4.53  3.87 - 5.11 MIL/uL Final   Hemoglobin 01/26/2022 11.3 (L)  12.0 - 15.0 g/dL Final   HCT 01/26/2022 36.4  36.0 - 46.0 % Final   MCV 01/26/2022 80.4  80.0 - 100.0 fL Final   MCH 01/26/2022 24.9 (L)  26.0 - 34.0 pg Final   MCHC 01/26/2022 31.0  30.0 - 36.0 g/dL Final   RDW 01/26/2022 17.3 (H)  11.5 - 15.5 % Final   Platelets 01/26/2022 327  150 - 400 K/uL Final   nRBC 01/26/2022 0.0  0.0 - 0.2 % Final   Neutrophils Relative % 01/26/2022 60  % Final   Neutro Abs 01/26/2022 3.9  1.7 - 7.7 K/uL Final   Lymphocytes Relative 01/26/2022 31  % Final   Lymphs Abs 01/26/2022 2.0  0.7 - 4.0 K/uL Final   Monocytes Relative 01/26/2022 7  % Final   Monocytes Absolute 01/26/2022 0.5  0.1 - 1.0 K/uL Final   Eosinophils Relative 01/26/2022 1  % Final   Eosinophils Absolute 01/26/2022 0.1  0.0 - 0.5 K/uL Final   Basophils Relative 01/26/2022 1  % Final   Basophils Absolute 01/26/2022 0.1  0.0 - 0.1 K/uL Final   Immature Granulocytes 01/26/2022 0  % Final   Abs Immature Granulocytes 01/26/2022 0.02  0.00 - 0.07 K/uL Final   Performed at [facility], [place] [address], [place], [place] [address]   Glucose-Capillary 01/26/2022 149 (H)  70 - 99 mg/dL Final   Glucose reference range applies only to samples taken after fasting for at least 8 hours.   Glucose-Capillary 01/26/2022 130 (H)  70 - 99 mg/dL Final   Glucose reference range applies only to samples taken  after fasting for at least 8 hours.   Glucose-Capillary 01/26/2022 151 (H)  70 - 99 mg/dL Final   Glucose reference range applies only to samples taken after fasting for at least 8 hours.   Glucose-Capillary 01/26/2022 170 (H)  70 - 99 mg/dL Final   Glucose reference range applies only to samples taken after fasting for at least 8 hours.   Glucose-Capillary 01/27/2022 129 (H)  70 - 99 mg/dL Final   Glucose reference range applies only to samples taken after fasting for at least 8 hours.   Glucose-Capillary 01/27/2022 101 (H)  70 - 99 mg/dL Final   Glucose reference range applies only to samples taken after fasting for at least 8 hours.   Glucose-Capillary 01/27/2022 220 (H)  70 - 99 mg/dL Final   Glucose reference range applies only to samples taken after fasting for at least 8 hours.   Glucose-Capillary 01/27/2022 154 (H)  70 - 99 mg/dL Final   Glucose reference range applies only to samples taken after fasting for at least 8 hours.   Glucose-Capillary 01/27/2022 163 (H)  70 - 99 mg/dL Final   Glucose reference range applies only to samples taken after fasting for at least 8 hours.   Glucose-Capillary 01/28/2022 127 (H)  70 - 99 mg/dL Final   Glucose reference range applies only to samples taken after fasting for at least 8 hours.   Glucose-Capillary 01/28/2022 110 (H)  70 - 99 mg/dL Final   Glucose reference range applies only to samples taken after fasting for at least 8 hours.   Glucose-Capillary 01/28/2022 167 (H)  70 - 99 mg/dL Final   Glucose reference range applies only to samples taken after fasting for at least 8 hours.   Glucose-Capillary 01/29/2022 145 (H)  70 - 99 mg/dL Final   Glucose reference range applies only to samples taken after fasting for at least 8 hours.   Glucose-Capillary 01/29/2022 203 (H)  70 - 99 mg/dL Final   Glucose reference range applies only to samples taken after fasting for at least 8 hours.   Glucose-Capillary 01/29/2022 153 (H)  70 - 99 mg/dL Final    Glucose reference range applies only to samples taken after fasting for at least 8 hours.   Glucose-Capillary 01/29/2022 166 (H)  70 - 99 mg/dL Final   Glucose reference range applies only to samples taken after fasting for at least 8 hours.   Glucose-Capillary 01/30/2022 142 (H)  70 - 99 mg/dL Final   Glucose reference range applies only to samples taken after fasting for at least 8 hours.   Glucose-Capillary 01/30/2022 138 (H)  70 - 99 mg/dL Final   Glucose reference range applies only to samples taken after fasting for at least 8 hours.   Glucose-Capillary 01/30/2022 185 (H)  70 - 99 mg/dL Final   Glucose reference range applies only to samples taken after fasting for at least 8 hours.   Glucose-Capillary 01/30/2022 220 (H)  70 - 99 mg/dL Final   Glucose reference range applies only to samples taken after fasting for at least 8 hours.   Glucose-Capillary 01/31/2022 147 (H)  70 - 99 mg/dL Final   Glucose reference range applies only to samples taken after fasting for at least 8 hours.   Glucose-Capillary 01/31/2022 131 (H)  70 - 99 mg/dL Final   Glucose reference range applies only to samples taken after fasting for at least 8 hours.   Glucose-Capillary 01/31/2022 169 (H)  70 - 99 mg/dL Final   Glucose reference range applies only to samples taken after fasting for at least 8 hours.   Glucose-Capillary 01/31/2022 144 (H)  70 - 99 mg/dL Final   Glucose reference range applies only to samples taken after fasting for at least 8 hours.   Comment 1 01/31/2022 RN AWARE   Final   Glucose-Capillary 02/01/2022 117 (H)  70 - 99 mg/dL Final   Glucose reference range applies only to samples taken after fasting for at least 8 hours.   Glucose-Capillary 02/01/2022 180 (H)  70 - 99 mg/dL Final   Glucose reference range applies only to samples taken after fasting for at least 8 hours.   Glucose-Capillary 02/01/2022 209 (H)  70 - 99 mg/dL Final   Glucose reference  range applies only to samples taken after  fasting for at least 8 hours.   Glucose-Capillary 02/02/2022 131 (H)  70 - 99 mg/dL Final   Glucose reference range applies only to samples taken after fasting for at least 8 hours.   WBC 02/02/2022 7.5  4.0 - 10.5 K/uL Final   RBC 02/02/2022 4.52  3.87 - 5.11 MIL/uL Final   Hemoglobin 02/02/2022 11.3 (L)  12.0 - 15.0 g/dL Final   HCT 02/02/2022 35.0 (L)  36.0 - 46.0 % Final   MCV 02/02/2022 77.4 (L)  80.0 - 100.0 fL Final   MCH 02/02/2022 25.0 (L)  26.0 - 34.0 pg Final   MCHC 02/02/2022 32.3  30.0 - 36.0 g/dL Final   RDW 02/02/2022 16.7 (H)  11.5 - 15.5 % Final   Platelets 02/02/2022 345  150 - 400 K/uL Final   nRBC 02/02/2022 0.0  0.0 - 0.2 % Final   Neutrophils Relative % 02/02/2022 63  % Final   Neutro Abs 02/02/2022 4.7  1.7 - 7.7 K/uL Final   Lymphocytes Relative 02/02/2022 28  % Final   Lymphs Abs 02/02/2022 2.1  0.7 - 4.0 K/uL Final   Monocytes Relative 02/02/2022 7  % Final   Monocytes Absolute 02/02/2022 0.5  0.1 - 1.0 K/uL Final   Eosinophils Relative 02/02/2022 1  % Final   Eosinophils Absolute 02/02/2022 0.1  0.0 - 0.5 K/uL Final   Basophils Relative 02/02/2022 1  % Final   Basophils Absolute 02/02/2022 0.1  0.0 - 0.1 K/uL Final   Immature Granulocytes 02/02/2022 0  % Final   Abs Immature Granulocytes 02/02/2022 0.03  0.00 - 0.07 K/uL Final   Performed at [facility], [place] [address], [place], [place] [address]   Glucose-Capillary 02/02/2022 95  70 - 99 mg/dL Final   Glucose reference range applies only to samples taken after fasting for at least 8 hours.   Glucose-Capillary 02/02/2022 120 (H)  70 - 99 mg/dL Final   Glucose reference range applies only to samples taken after fasting for at least 8 hours.   Glucose-Capillary 02/02/2022 191 (H)  70 - 99 mg/dL Final   Glucose reference range applies only to samples taken after fasting for at least 8 hours.   Glucose-Capillary 02/03/2022 148 (H)  70 - 99 mg/dL Final   Glucose reference range applies only to samples  taken after fasting for at least 8 hours.   Glucose-Capillary 02/03/2022 122 (H)  70 - 99 mg/dL Final   Glucose reference range applies only to samples taken after fasting for at least 8 hours.   Glucose-Capillary 02/03/2022 233 (H)  70 - 99 mg/dL Final   Glucose reference range applies only to samples taken after fasting for at least 8 hours.   Glucose-Capillary 02/04/2022 126 (H)  70 - 99 mg/dL Final   Glucose reference range applies only to samples taken after fasting for at least 8 hours.   Glucose-Capillary 02/04/2022 117 (H)  70 - 99 mg/dL Final   Glucose reference range applies only to samples taken after fasting for at least 8 hours.   Glucose-Capillary 02/04/2022 135 (H)  70 - 99 mg/dL Final   Glucose reference range applies only to samples taken after fasting for at least 8 hours.   Glucose-Capillary 02/04/2022 197 (H)  70 - 99 mg/dL Final   Glucose reference range applies only to samples taken after fasting for at least 8 hours.   Glucose-Capillary 02/05/2022 170 (H)  70 -  99 mg/dL Final   Glucose reference range applies only to samples taken after fasting for at least 8 hours.   Glucose-Capillary 02/05/2022 107 (H)  70 - 99 mg/dL Final   Glucose reference range applies only to samples taken after fasting for at least 8 hours.   Glucose-Capillary 02/05/2022 184 (H)  70 - 99 mg/dL Final   Glucose reference range applies only to samples taken after fasting for at least 8 hours.   Glucose-Capillary 02/05/2022 256 (H)  70 - 99 mg/dL Final   Glucose reference range applies only to samples taken after fasting for at least 8 hours.   Glucose-Capillary 02/06/2022 144 (H)  70 - 99 mg/dL Final   Glucose reference range applies only to samples taken after fasting for at least 8 hours.   Glucose-Capillary 02/06/2022 190 (H)  70 - 99 mg/dL Final   Glucose reference range applies only to samples taken after fasting for at least 8 hours.   Glucose-Capillary 02/06/2022 92  70 - 99 mg/dL Final    Glucose reference range applies only to samples taken after fasting for at least 8 hours.   Trichomonas 02/06/2022 Negative   Final   Bacterial Vaginitis-Urine 02/06/2022 Negative   Final   Molecular Comment 02/06/2022 For tests bacteria and/or candida, this specimen does not meet the   Final   Molecular Comment 02/06/2022 strict criteria set by the FDA. The result interpretation should be   Final   Molecular Comment 02/06/2022 considered in conjunction with the patient's clinical history.   Final   Comment 02/06/2022 Normal Reference Range Trichomonas - Negative   Final   Glucose-Capillary 02/06/2022 197 (H)  70 - 99 mg/dL Final   Glucose reference range applies only to samples taken after fasting for at least 8 hours.   Glucose-Capillary 02/07/2022 129 (H)  70 - 99 mg/dL Final   Glucose reference range applies only to samples taken after fasting for at least 8 hours.   Glucose-Capillary 02/07/2022 207 (H)  70 - 99 mg/dL Final   Glucose reference range applies only to samples taken after fasting for at least 8 hours.   Glucose-Capillary 02/07/2022 153 (H)  70 - 99 mg/dL Final   Glucose reference range applies only to samples taken after fasting for at least 8 hours.   Glucose-Capillary 02/08/2022 129 (H)  70 - 99 mg/dL Final   Glucose reference range applies only to samples taken after fasting for at least 8 hours.   Glucose-Capillary 02/08/2022 107 (H)  70 - 99 mg/dL Final   Glucose reference range applies only to samples taken after fasting for at least 8 hours.  Admission on 01/23/2022, Discharged on 01/24/2022  Component Date Value Ref Range Status   Sodium 01/23/2022 140  135 - 145 mmol/L Final   Potassium 01/23/2022 4.4  3.5 - 5.1 mmol/L Final   Chloride 01/23/2022 101  98 - 111 mmol/L Final   CO2 01/23/2022 25  22 - 32 mmol/L Final   Glucose, Bld 01/23/2022 198 (H)  70 - 99 mg/dL Final   Glucose reference range applies only to samples taken after fasting for at least 8 hours.   BUN  01/23/2022 13  6 - 20 mg/dL Final   Creatinine, Ser 01/23/2022 0.62  0.44 - 1.00 mg/dL Final   Calcium 01/23/2022 9.3  8.9 - 10.3 mg/dL Final   GFR, Estimated 01/23/2022 >60  >60 mL/min Final   Comment: (NOTE) Calculated using the CKD-EPI Creatinine Equation (2021)    Anion  gap 01/23/2022 14  5 - 15 Final   Performed at [facility], [place] [address], [place], [place] [address]   WBC 01/23/2022 5.8  4.0 - 10.5 K/uL Final   RBC 01/23/2022 4.63  3.87 - 5.11 MIL/uL Final   Hemoglobin 01/23/2022 11.7 (L)  12.0 - 15.0 g/dL Final   HCT 01/23/2022 37.4  36.0 - 46.0 % Final   MCV 01/23/2022 80.8  80.0 - 100.0 fL Final   MCH 01/23/2022 25.3 (L)  26.0 - 34.0 pg Final   MCHC 01/23/2022 31.3  30.0 - 36.0 g/dL Final   RDW 01/23/2022 17.9 (H)  11.5 - 15.5 % Final   Platelets 01/23/2022 333  150 - 400 K/uL Final   nRBC 01/23/2022 0.0  0.0 - 0.2 % Final   Performed at [facility] [address] [place], [place] [address]   Troponin I (High Sensitivity) 01/23/2022 6  <18 ng/L Final   Comment: (NOTE) Elevated high sensitivity troponin I (hsTnI) values and significant  changes across serial measurements may suggest ACS but many other  chronic and acute conditions are known to elevate hsTnI results.  Refer to the "Links" section for chest pain algorithms and additional  guidance. Performed at [facility], [place] [address] [place], [place] [address]    Troponin I (High Sensitivity) 01/23/2022 5  <18 ng/L Final   Comment: (NOTE) Elevated high sensitivity troponin I (hsTnI) values and significant  changes across serial measurements may suggest ACS but many other  chronic and acute conditions are known to elevate hsTnI results.  Refer to the "Links" section for chest pain algorithms and additional  guidance. Performed at [facility], [place] [address], [place], [place] [address]    SARS Coronavirus 2 by RT PCR 01/23/2022 NEGATIVE  NEGATIVE Final   Comment: (NOTE) SARS-CoV-2  target nucleic acids are NOT DETECTED.  The SARS-CoV-2 RNA is generally detectable in upper and lower respiratory specimens during the acute phase of infection. The lowest concentration of SARS-CoV-2 viral copies this assay can detect is 250 copies / mL. A negative result does not preclude SARS-CoV-2 infection and should not be used as the sole basis for treatment or other patient management decisions.  A negative result may occur with improper specimen collection / handling, submission of specimen other than nasopharyngeal swab, presence of viral mutation(s) within the areas targeted by this assay, and inadequate number of viral copies (<250 copies / mL). A negative result must be combined with clinical observations, patient history, and epidemiological information.  Fact Sheet for Patients:   https://www.patel.info/  Fact Sheet for Healthcare Providers: https://hall.com/  This test is not yet approved or                           cleared by the [place] FDA and has been authorized for detection and/or diagnosis of SARS-CoV-2 by FDA under an Emergency Use Authorization (EUA).  This EUA will remain in effect (meaning this test can be used) for the duration of the COVID-19 declaration under Section 564(b)(1) of the Act, 21 U.S.C. section 360bbb-3(b)(1), unless the authorization is terminated or revoked sooner.  Performed at [facility], [place] [address], [place], [place] [address]    B Natriuretic Peptide 01/23/2022 41.3  0.0 - 100.0 pg/mL Final   Performed at [facility] [address], [place], [place] [address]   Group A Strep by PCR  01/23/2022 NOT DETECTED  NOT DETECTED Final   Performed at [facility], [place] [address] [place], [place] [address]   Color, Urine 01/23/2022 YELLOW  YELLOW Final   APPearance 01/23/2022 CLEAR  CLEAR Final   Specific Gravity, Urine 01/23/2022 1.018  1.005 - 1.030 Final   pH  01/23/2022 7.0  5.0 - 8.0 Final   Glucose, UA 01/23/2022 NEGATIVE  NEGATIVE mg/dL Final   Hgb urine dipstick 01/23/2022 NEGATIVE  NEGATIVE Final   Bilirubin Urine 01/23/2022 NEGATIVE  NEGATIVE Final   Ketones, ur 01/23/2022 NEGATIVE  NEGATIVE mg/dL Final   Protein, ur 01/23/2022 NEGATIVE  NEGATIVE mg/dL Final   Nitrite 01/23/2022 NEGATIVE  NEGATIVE Final   Leukocytes,Ua 01/23/2022 TRACE (A)  NEGATIVE Final   RBC / HPF 01/23/2022 0-5  0 - 5 RBC/hpf Final   WBC, UA 01/23/2022 0-5  0 - 5 WBC/hpf Final   Bacteria, UA 01/23/2022 NONE SEEN  NONE SEEN Final   Squamous Epithelial / HPF 01/23/2022 0-5  0 - 5 Final   Mucus 01/23/2022 PRESENT   Final   Performed at [facility], [place] [address], [place],  [address]   SARS Coronavirus 2 by RT PCR 01/23/2022 NEGATIVE  NEGATIVE Final   Comment: (NOTE) SARS-CoV-2 target nucleic acids are NOT DETECTED.  The SARS-CoV-2 RNA is generally detectable in upper respiratory specimens during the acute phase of infection. The lowest concentration of SARS-CoV-2 viral copies this assay can detect is 138 copies/mL. A negative result does not preclude SARS-Cov-2 infection and should not be used as the sole basis for treatment or other patient management decisions. A negative result may occur with  improper specimen collection/handling, submission of specimen other than nasopharyngeal swab, presence of viral mutation(s) within the areas targeted by this assay, and inadequate number of viral copies(<138 copies/mL). A negative result must be combined with clinical observations, patient history, and epidemiological information. The expected result is Negative.  Fact Sheet for Patients:  EntrepreneurPulse.com.au  Fact Sheet for Healthcare Providers:  IncredibleEmployment.be  This test is no                          t yet approved or cleared by the [place] FDA and  has been authorized for detection and/or  diagnosis of SARS-CoV-2 by FDA under an Emergency Use Authorization (EUA). This EUA will remain  in effect (meaning this test can be used) for the duration of the COVID-19 declaration under Section 564(b)(1) of the Act, 21 U.S.C.section 360bbb-3(b)(1), unless the authorization is terminated  or revoked sooner.       Influenza A by PCR 01/23/2022 NEGATIVE  NEGATIVE Final   Influenza B by PCR 01/23/2022 NEGATIVE  NEGATIVE Final   Comment: (NOTE) The Xpert Xpress SARS-CoV-2/FLU/RSV plus assay is intended as an aid in the diagnosis of influenza from Nasopharyngeal swab specimens and should not be used as a sole basis for treatment. Nasal washings and aspirates are unacceptable for Xpert Xpress SARS-CoV-2/FLU/RSV testing.  Fact Sheet for Patients: EntrepreneurPulse.com.au  Fact Sheet for Healthcare Providers: IncredibleEmployment.be  This test is not yet approved or cleared by the [place] FDA and has been authorized for detection and/or diagnosis of SARS-CoV-2 by FDA under an Emergency Use Authorization (EUA). This EUA will remain in effect (meaning this test can be used) for the duration of the COVID-19 declaration under Section 564(b)(1) of the Act, 21 U.S.C. section 360bbb-3(b)(1), unless the authorization is terminated or revoked.  Performed at [facility], [place] [address] [place], [place] [address]    WBC 01/24/2022 6.1  4.0 - 10.5 K/uL Final   RBC 01/24/2022 4.60  3.87 - 5.11 MIL/uL Final   Hemoglobin 01/24/2022 11.7 (L)  12.0 - 15.0 g/dL Final   HCT 01/24/2022 37.0  36.0 - 46.0 % Final   MCV 01/24/2022 80.4  80.0 - 100.0 fL Final   MCH 01/24/2022 25.4 (L)  26.0 - 34.0 pg Final   MCHC 01/24/2022 31.6  30.0 - 36.0 g/dL Final   RDW 01/24/2022 17.6 (H)  11.5 - 15.5 % Final   Platelets 01/24/2022 334  150 - 400 K/uL Final   nRBC 01/24/2022 0.0  0.0 - 0.2 % Final   Neutrophils Relative % 01/24/2022 53  % Final   Neutro Abs  01/24/2022 3.3  1.7 - 7.7 K/uL Final   Lymphocytes Relative 01/24/2022 33  % Final   Lymphs Abs 01/24/2022 2.0  0.7 - 4.0 K/uL Final   Monocytes Relative 01/24/2022 11  % Final   Monocytes Absolute 01/24/2022 0.7  0.1 - 1.0 K/uL Final   Eosinophils Relative 01/24/2022 2  % Final   Eosinophils Absolute 01/24/2022 0.1  0.0 - 0.5 K/uL Final   Basophils Relative 01/24/2022 1  % Final   Basophils Absolute 01/24/2022 0.1  0.0 - 0.1 K/uL Final   Immature Granulocytes 01/24/2022 0  % Final   Abs Immature Granulocytes 01/24/2022 0.02  0.00 - 0.07 K/uL Final   Performed at [facility], [place] [address] [place], [place] [address]   Glucose-Capillary 01/24/2022 110 (H)  70 - 99 mg/dL Final   Glucose reference range applies only to samples taken after fasting for at least 8 hours.  No results displayed because visit has over 200 results.    Admission on 11/22/2021, Discharged on 11/22/2021  Component Date Value Ref Range Status   Glucose-Capillary 11/22/2021 169 (H)  70 - 99 mg/dL Final   Glucose reference range applies only to samples taken after fasting for at least 8 hours.  No results displayed because visit has over 200 results.    Admission on 10/04/2021, Discharged on 10/22/2021  Component Date Value Ref Range Status   SARS Coronavirus 2 by RT PCR 10/04/2021 NEGATIVE  NEGATIVE Final   Comment: (NOTE) SARS-CoV-2 target nucleic acids are NOT DETECTED.  The SARS-CoV-2 RNA is generally detectable in upper respiratory specimens during the acute phase of infection. The lowest concentration of SARS-CoV-2 viral copies this assay can detect is 138 copies/mL. A negative result does not preclude SARS-Cov-2 infection and should not be used as the sole basis for treatment or other patient management decisions. A negative result may occur with  improper specimen collection/handling, submission of specimen other than nasopharyngeal swab, presence of viral mutation(s) within the areas targeted by  this assay, and inadequate number of viral copies(<138 copies/mL). A negative result must be combined with clinical observations, patient history, and epidemiological information. The expected result is Negative.  Fact Sheet for Patients:  EntrepreneurPulse.com.au  Fact Sheet for Healthcare Providers:  IncredibleEmployment.be  This test is no                          t yet approved or cleared by the [place] FDA and  has been authorized for detection and/or diagnosis of SARS-CoV-2 by FDA under an Emergency Use Authorization (EUA). This EUA will remain  in effect (meaning this test can  be used) for the duration of the COVID-19 declaration under Section 564(b)(1) of the Act, 21 U.S.C.section 360bbb-3(b)(1), unless the authorization is terminated  or revoked sooner.       Influenza A by PCR 10/04/2021 NEGATIVE  NEGATIVE Final   Influenza B by PCR 10/04/2021 NEGATIVE  NEGATIVE Final   Comment: (NOTE) The Xpert Xpress SARS-CoV-2/FLU/RSV plus assay is intended as an aid in the diagnosis of influenza from Nasopharyngeal swab specimens and should not be used as a sole basis for treatment. Nasal washings and aspirates are unacceptable for Xpert Xpress SARS-CoV-2/FLU/RSV testing.  Fact Sheet for Patients: EntrepreneurPulse.com.au  Fact Sheet for Healthcare Providers: IncredibleEmployment.be  This test is not yet approved or cleared by the [place] FDA and has been authorized for detection and/or diagnosis of SARS-CoV-2 by FDA under an Emergency Use Authorization (EUA). This EUA will remain in effect (meaning this test can be used) for the duration of the COVID-19 declaration under Section 564(b)(1) of the Act, 21 U.S.C. section 360bbb-3(b)(1), unless the authorization is terminated or revoked.  Performed at [facility], [place] [address], [place], [place] [address]    WBC 10/04/2021 8.4  4.0 -  10.5 K/uL Final   RBC 10/04/2021 4.42  3.87 - 5.11 MIL/uL Final   Hemoglobin 10/04/2021 11.6 (L)  12.0 - 15.0 g/dL Final   HCT 10/04/2021 35.7 (L)  36.0 - 46.0 % Final   MCV 10/04/2021 80.8  80.0 - 100.0 fL Final   MCH 10/04/2021 26.2  26.0 - 34.0 pg Final   MCHC 10/04/2021 32.5  30.0 - 36.0 g/dL Final   RDW 10/04/2021 16.0 (H)  11.5 - 15.5 % Final   Platelets 10/04/2021 372  150 - 400 K/uL Final   nRBC 10/04/2021 0.0  0.0 - 0.2 % Final   Neutrophils Relative % 10/04/2021 68  % Final   Neutro Abs 10/04/2021 5.7  1.7 - 7.7 K/uL Final   Lymphocytes Relative 10/04/2021 22  % Final   Lymphs Abs 10/04/2021 1.8  0.7 - 4.0 K/uL Final   Monocytes Relative 10/04/2021 8  % Final   Monocytes Absolute 10/04/2021 0.7  0.1 - 1.0 K/uL Final   Eosinophils Relative 10/04/2021 1  % Final   Eosinophils Absolute 10/04/2021 0.1  0.0 - 0.5 K/uL Final   Basophils Relative 10/04/2021 1  % Final   Basophils Absolute 10/04/2021 0.1  0.0 - 0.1 K/uL Final   Immature Granulocytes 10/04/2021 0  % Final   Abs Immature Granulocytes 10/04/2021 0.03  0.00 - 0.07 K/uL Final   Performed at [facility], [place] [address], [place], [place] [address]   Sodium 10/04/2021 136  135 - 145 mmol/L Final   Potassium 10/04/2021 4.2  3.5 - 5.1 mmol/L Final   Chloride 10/04/2021 104  98 - 111 mmol/L Final   CO2 10/04/2021 25  22 - 32 mmol/L Final   Glucose, Bld 10/04/2021 100 (H)  70 - 99 mg/dL Final   Glucose reference range applies only to samples taken after fasting for at least 8 hours.   BUN 10/04/2021 9  6 - 20 mg/dL Final   Creatinine, Ser 10/04/2021 0.52  0.44 - 1.00 mg/dL Final   Calcium 10/04/2021 9.0  8.9 - 10.3 mg/dL Final   Total Protein 10/04/2021 7.0  6.5 - 8.1 g/dL Final   Albumin 10/04/2021 3.2 (L)  3.5 - 5.0 g/dL Final   AST 10/04/2021 13 (L)  15 - 41 U/L Final   ALT 10/04/2021  10  0 - 44 U/L Final   Alkaline Phosphatase 10/04/2021 61  38 - 126 U/L Final   Total Bilirubin 10/04/2021 0.3  0.3 - 1.2 mg/dL  Final   GFR, Estimated 10/04/2021 >60  >60 mL/min Final   Comment: (NOTE) Calculated using the CKD-EPI Creatinine Equation (2021)    Anion gap 10/04/2021 7  5 - 15 Final   Performed at [facility] [address], [place], [place] [address]   Hgb A1c MFr Bld 10/04/2021 7.0 (H)  4.8 - 5.6 % Final   Comment: (NOTE) Pre diabetes:          5.7%-6.4%  Diabetes:              >6.4%  Glycemic control for   <7.0% adults with diabetes    Mean Plasma Glucose 10/04/2021 154.2  mg/dL Final   Performed at [facility], [place] [address], [place], [place] [address]   Cholesterol 10/04/2021 178  0 - 200 mg/dL Final   Triglycerides 10/04/2021 155 (H)  <150 mg/dL Final   HDL 10/04/2021 52  >40 mg/dL Final   Total CHOL/HDL Ratio 10/04/2021 3.4  RATIO Final   VLDL 10/04/2021 31  0 - 40 mg/dL Final   LDL Cholesterol 10/04/2021 95  0 - 99 mg/dL Final   Comment:        Total Cholesterol/HDL:CHD Risk Coronary Heart Disease Risk Table                     Men   Women  1/2 Average Risk   3.4   3.3  Average Risk       5.0   4.4  2 X Average Risk   9.6   7.1  3 X Average Risk  23.4   11.0        Use the calculated Patient Ratio above and the CHD Risk Table to determine the patient's CHD Risk.        ATP III CLASSIFICATION (LDL):  <100     mg/dL   Optimal  100-129  mg/dL   Near or Above                    Optimal  130-159  mg/dL   Borderline  160-189  mg/dL   High  >190     mg/dL   Very High Performed at [facility] [address], [place], [place] [address]    POC Amphetamine UR 10/04/2021 None Detected  NONE DETECTED (Cut Off Level 1000 ng/mL) Final   POC Secobarbital (BAR) 10/04/2021 None Detected  NONE DETECTED (Cut Off Level 300 ng/mL) Final   POC Buprenorphine (BUP) 10/04/2021 None Detected  NONE DETECTED (Cut Off Level 10 ng/mL) Final   POC Oxazepam (BZO) 10/04/2021 None Detected  NONE DETECTED (Cut Off Level 300 ng/mL) Final   POC Cocaine UR 10/04/2021 None Detected   NONE DETECTED (Cut Off Level 300 ng/mL) Final   POC Methamphetamine UR 10/04/2021 None Detected  NONE DETECTED (Cut Off Level 1000 ng/mL) Final   POC Morphine 10/04/2021 None Detected  NONE DETECTED (Cut Off Level 300 ng/mL) Final   POC Methadone UR 10/04/2021 None Detected  NONE DETECTED (Cut Off Level 300 ng/mL) Final   POC Oxycodone UR 10/04/2021 Positive (A)  NONE DETECTED (Cut Off Level 100 ng/mL) Final   POC Marijuana UR 10/04/2021 None Detected  NONE DETECTED (Cut Off Level 50 ng/mL) Final   SARSCOV2ONAVIRUS 2 AG  10/04/2021 NEGATIVE  NEGATIVE Final   Comment: (NOTE) SARS-CoV-2 antigen NOT DETECTED.   Negative results are presumptive.  Negative results do not preclude SARS-CoV-2 infection and should not be used as the sole basis for treatment or other patient management decisions, including infection  control decisions, particularly in the presence of clinical signs and  symptoms consistent with COVID-19, or in those who have been in contact with the virus.  Negative results must be combined with clinical observations, patient history, and epidemiological information. The expected result is Negative.  Fact Sheet for Patients: HandmadeRecipes.com.cy  Fact Sheet for Healthcare Providers: FuneralLife.at  This test is not yet approved or cleared by the [place] FDA and  has been authorized for detection and/or diagnosis of SARS-CoV-2 by FDA under an Emergency Use Authorization (EUA).  This EUA will remain in effect (meaning this test can be used) for the duration of  the COV                          ID-19 declaration under Section 564(b)(1) of the Act, 21 U.S.C. section 360bbb-3(b)(1), unless the authorization is terminated or revoked sooner.     Valproic Acid Lvl 10/04/2021 51  50.0 - 100.0 ug/mL Final   Performed at [facility] [address], [place], [place] [address]   Valproic Acid Lvl 10/08/2021 57  50.0 - 100.0  ug/mL Final   Performed at [facility] [address] [place], [place] [address]   Glucose-Capillary 10/09/2021 123 (H)  70 - 99 mg/dL Final   Glucose reference range applies only to samples taken after fasting for at least 8 hours.  There may be more visits with results that are not included.    Blood Alcohol level:  Lab Results  Component Value Date   ETH <10 11/23/2021   ETH <10 67/67/2094    Metabolic Disorder Labs: Lab Results  Component Value Date   HGBA1C 6.7 (H) 12/28/2021   MPG 145.59 12/28/2021   MPG 154.2 10/04/2021   Lab Results  Component Value Date   PROLACTIN 3.8 (L) 11/23/2021   Lab Results  Component Value Date   CHOL 171 11/23/2021   TRIG 125 11/23/2021   HDL 65 11/23/2021   CHOLHDL 2.6 11/23/2021   VLDL 25 11/23/2021   LDLCALC 81 11/23/2021   LDLCALC 95 10/04/2021    Therapeutic Lab Levels: No results found for: "LITHIUM" Lab Results  Component Value Date   VALPROATE 17 (L) 02/17/2022   VALPROATE 33 (L) 01/18/2022   No results found for: "CBMZ"  Physical Findings   PHQ2-9    Flowsheet Row ED from 11/23/2021 in [facility]  PHQ-2 Total Score 2  PHQ-9 Total Score 3      Flowsheet Row ED from 02/18/2022 in [facility] ED from 02/17/2022 in [facility] ED from 02/09/2022 in [facility] No Risk No Risk No Risk        Musculoskeletal  Strength & Muscle Tone: within normal limits Gait & Station: normal Patient leans: N/A  Psychiatric Specialty Exam  Presentation  General Appearance:  Appropriate for Environment; Casual  Eye Contact: Good  Speech: Clear and Coherent; Normal Rate  Speech Volume: Normal  Handedness: Right   Mood and Affect  Mood: Euthymic  Affect: Appropriate; Congruent   Thought Process  Thought Processes: Coherent; Goal Directed;  Linear  Descriptions of Associations:Intact  Orientation:Full (Time, Place and Person)  Thought Content:Logical; WDL  Diagnosis of Schizophrenia or Schizoaffective disorder in past: Yes  Duration of Psychotic Symptoms: No data recorded  Hallucinations:Hallucinations: None  Ideas of Reference:None  Suicidal Thoughts:Suicidal Thoughts: No  Homicidal Thoughts:Homicidal Thoughts: No   Sensorium  Memory: Immediate Good  Judgment: Fair  Insight: Fair   Executive Functions  Concentration: Good  Attention Span: Good  Recall: Good  Fund of Knowledge: Fair  Language: Fair   Psychomotor Activity  Psychomotor Activity: Psychomotor Activity: Normal   Assets  Assets: Communication Skills; Desire for Improvement; Social Support   Sleep  Sleep: Sleep: Good   No data recorded  Physical Exam  Physical Exam Vitals and nursing note reviewed.  Constitutional:      Appearance: Normal appearance. She is well-developed.  HENT:     Head: Normocephalic and atraumatic.     Nose: Nose normal.  Cardiovascular:     Rate and Rhythm: Normal rate.  Pulmonary:     Effort: Pulmonary effort is normal.  Musculoskeletal:        General: Normal range of motion.     Cervical back: Normal range of motion.  Skin:    General: Skin is warm and dry.  Neurological:     Mental Status: She is alert and oriented to person, place, and time.  Psychiatric:        Attention and Perception: Attention and perception normal.        Mood and Affect: Mood and affect normal.        Speech: Speech normal.        Behavior: Behavior normal. Behavior is cooperative.        Thought Content: Thought content normal.    Review of Systems  Constitutional: Negative.   HENT: Negative.    Eyes: Negative.   Respiratory: Negative.    Cardiovascular: Negative.   Gastrointestinal: Negative.   Genitourinary: Negative.   Musculoskeletal: Negative.   Skin: Negative.   Neurological: Negative.    Psychiatric/Behavioral: Negative.     Blood pressure 112/70, pulse 100, temperature 98 F (36.7 C), temperature source Oral, resp. rate 20, SpO2 100 %. There is no height or weight on file to calculate BMI.  Treatment Plan Summary: Patient reviewed with Dr Hampton Abbot.  She remains voluntarily admitted to [facility] while awaiting disposition.  Current plan includes planned discharge on Friday, 03/15/2022 at 1300 to [facility].  Daily contact with patient to assess and evaluate symptoms and progress in treatment  Lucky Rathke, FNP 03/04/2022 8:55 AM

## 2022-03-04 NOTE — ED Notes (Signed)
Patient is currently lying in bed quietly, no distress noted, will continue to monitor patient for safety.  

## 2022-03-05 ENCOUNTER — Ambulatory Visit: Payer: Medicaid Other | Attending: General Practice | Admitting: General Practice

## 2022-03-05 DIAGNOSIS — F209 Schizophrenia, unspecified: Secondary | ICD-10-CM | POA: Diagnosis not present

## 2022-03-05 LAB — GLUCOSE, CAPILLARY
Glucose-Capillary: 120 mg/dL — ABNORMAL HIGH (ref 70–99)
Glucose-Capillary: 237 mg/dL — ABNORMAL HIGH (ref 70–99)
Glucose-Capillary: 168 mg/dL — ABNORMAL HIGH (ref 70–99)
Glucose-Capillary: 200 mg/dL — ABNORMAL HIGH (ref 70–99)

## 2022-03-05 NOTE — ED Notes (Signed)
Pt sleeping@this time. Breathing even and unlabored. Will continue to monitor for safety 

## 2022-03-05 NOTE — ED Notes (Signed)
Pt sleeping at present, no distress noted.  Monitoring for safety. 

## 2022-03-05 NOTE — ED Provider Notes (Signed)
Behavioral Health Progress Note  Date and Time: 03/05/2022 8:53 AM Name: Teresa Murillo MRN:  [id number]  Subjective:  Teresa Murillo is a 62 y/o female with PMH of schizophrenia, borderline intellectual functioning, tobacco use d/o, HTN, HLD, DM2 (insulin-dependent), atrial flutter, COPD, admitted to [facility] on 11/23/21 voluntarily, accompanied by [employer], and reported that she was locked out of her group home. It was later confirmed that Teresa Murillo was dismissed from group home due to recurrent history of eloping from the group home. Teresa Murillo has been boarding at this facility until placement is secured.   "I just can't believe it"  Pt lying down, sits up on my approach. Pt reports euthymic, "pretty good" mood. She tells me that she has a placement secured and will be leaving on 03/15/22 at 1PM. She endorses feeling "good to go". She denies suicidal, homicidal or violent ideations. She denies auditory visual hallucinations or paranoia. She has remained compliant with medications and appropriate with staff and peers. Objectively, there is no evidence of agitation, aggression or distractibility. There is no evidence of responding to internal stimuli. Pt is calm, cooperative, pleasant.   Diagnosis:  Final diagnoses:  Schizophrenia, unspecified type (Teresa Murillo)    Total Time spent with patient: 15 minutes  Past Psychiatric History: see HPI  Past Medical History:  Past Medical History:  Diagnosis Date   Borderline intellectual functioning 12/14/2021   On 12/14/2021: Appreciate assistance from psychology consult. On the Wechsler Adult Intelligence Scale-4, Teresa Murillo achieved a full-scale IQ score of 73 and a percentile rank of 4 placing her in the borderline range of intellectual functioning.    Chronic obstructive pulmonary disease (COPD) (HCC)    Glaucoma    Hyperlipidemia    Hypertension    Iron deficiency    Schizoaffective disorder (HCC)    Type 2 diabetes mellitus (HCC)     Past Surgical History:   Procedure Laterality Date   TUBAL LIGATION     Family History:  Family History  Problem Relation Age of Onset   Breast cancer Maternal Grandmother    Family Psychiatric  History: None reported Social History:  Social History   Substance and Sexual Activity  Alcohol Use Not Currently     Social History   Substance and Sexual Activity  Drug Use Not Currently    Social History   Socioeconomic History   Marital status: Divorced    Spouse name: Not on file   Number of children: Not on file   Years of education: Not on file   Highest education level: Not on file  Occupational History   Not on file  Tobacco Use   Smoking status: Former    Packs/day: 1.00    Types: Cigars, Cigarettes    Quit date: 11/09/2021    Years since quitting: 0.3   Smokeless tobacco: Current  Vaping Use   Vaping Use: Never used  Substance and Sexual Activity   Alcohol use: Not Currently   Drug use: Not Currently   Sexual activity: Not Currently    Birth control/protection: Surgical  Other Topics Concern   Not on file  Social History Narrative   Not on file   Social Determinants of Health   Financial Resource Strain: Not on file  Food Insecurity: Not on file  Transportation Needs: Not on file  Physical Activity: Not on file  Stress: Not on file  Social Connections: Not on file   SDOH:  SDOH Screenings   Depression (PHQ2-9): Low Risk  (11/23/2021)  Tobacco Use:  High Risk (02/17/2022)   Additional Social History:                         Sleep: Good  Appetite:  Good  Current Medications:  Current Facility-Administered Medications  Medication Dose Route Frequency Provider Last Rate Last Admin   acetaminophen (TYLENOL) tablet 650 mg  650 mg Oral Q6H PRN Sindy Guadeloupe, NP   650 mg at 03/03/22 2130   albuterol (VENTOLIN HFA) 108 (90 Base) MCG/ACT inhaler 2 puff  2 puff Inhalation Q6H PRN Sindy Guadeloupe, NP   2 puff at 02/20/22 1944   alum & mag hydroxide-simeth  (MAALOX/MYLANTA) 200-200-20 MG/5ML suspension 30 mL  30 mL Oral Q4H PRN Sindy Guadeloupe, NP   30 mL at 03/04/22 2147   apixaban (ELIQUIS) tablet 5 mg  5 mg Oral BID Sindy Guadeloupe, NP   5 mg at 03/04/22 2146   ARIPiprazole (ABILIFY) tablet 10 mg  10 mg Oral Daily Sindy Guadeloupe, NP   10 mg at 03/04/22 0946   cloZAPine (CLOZARIL) tablet 75 mg  75 mg Oral BID Park Pope, MD   75 mg at 03/04/22 2146   colchicine tablet 0.6 mg  0.6 mg Oral QHS Sindy Guadeloupe, NP   0.6 mg at 03/04/22 2146   diltiazem (CARDIZEM CD) 24 hr capsule 240 mg  240 mg Oral Daily Sindy Guadeloupe, NP   240 mg at 03/04/22 0948   divalproex (DEPAKOTE ER) 24 hr tablet 500 mg  500 mg Oral BID Sindy Guadeloupe, NP   500 mg at 03/04/22 2146   haloperidol (HALDOL) tablet 10 mg  10 mg Oral TID PRN Sindy Guadeloupe, NP       insulin aspart (novoLOG) injection 0-9 Units  0-9 Units Subcutaneous TID [facility] Park Pope, MD   2 Units at 03/04/22 1643   insulin glargine-yfgn (SEMGLEE) injection 12 Units  12 Units Subcutaneous Daily Sindy Guadeloupe, NP   12 Units at 03/04/22 0957   latanoprost (XALATAN) 0.005 % ophthalmic solution 1 drop  1 drop Both Eyes QHS Sindy Guadeloupe, NP   1 drop at 03/04/22 2147   magnesium hydroxide (MILK OF MAGNESIA) suspension 30 mL  30 mL Oral Daily PRN Sindy Guadeloupe, NP       metFORMIN (GLUCOPHAGE) tablet 500 mg  500 mg Oral BID WC Sindy Guadeloupe, NP   500 mg at 03/04/22 1642   mometasone-formoterol (DULERA) 200-5 MCG/ACT inhaler 2 puff  2 puff Inhalation BID Sindy Guadeloupe, NP   2 puff at 03/04/22 2027   multivitamins with iron tablet 1 tablet  1 tablet Oral QHS Princess Bruins, DO   1 tablet at 03/04/22 2146   nicotine (NICODERM CQ - dosed in mg/24 hr) patch 7 mg  7 mg Transdermal Daily Sindy Guadeloupe, NP   7 mg at 03/04/22 0949   rosuvastatin (CRESTOR) tablet 20 mg  20 mg Oral QHS Princess Bruins, DO   20 mg at 03/04/22 2146   valbenazine (INGREZZA) capsule 40 mg  40 mg Oral q AM Sindy Guadeloupe, NP   40 mg at 03/05/22 1610   Vitamin D  (Ergocalciferol) (DRISDOL) 1.25 MG (50000 UNIT) capsule 50,000 Units  50,000 Units Oral Q Crista Curb, NP   50,000 Units at 03/04/22 9604   Current Outpatient Medications  Medication Sig Dispense Refill   Accu-Chek Softclix Lancets lancets Use as directed up to four times daily 100 each 0   apixaban (ELIQUIS) 5 MG TABS tablet Take 1 tablet (5  mg total) by mouth 2 (two) times daily. 60 tablet 0   ARIPiprazole (ABILIFY) 10 MG tablet Take 1 tablet (10 mg total) by mouth daily. 30 tablet 0   Blood Glucose Monitoring Suppl (ACCU-CHEK GUIDE) w/Device KIT Use as directed up to four times daily 1 kit 0   budesonide-formoterol (SYMBICORT) 160-4.5 MCG/ACT inhaler Inhale 2 puffs into the lungs in the morning and at bedtime.     Cholecalciferol (VITAMIN D3) 1.25 MG (50000 UT) CAPS Take 50,000 Units by mouth every Thursday.     clozapine (CLOZARIL) 50 MG tablet Take 1 tablet (50 mg total) by mouth daily. 30 tablet 0   colchicine 0.6 MG tablet Take 1 tablet (0.6 mg total) by mouth at bedtime. 30 tablet 0   diltiazem (CARDIZEM CD) 240 MG 24 hr capsule Take 1 capsule (240 mg total) by mouth daily. (Patient not taking: Reported on 11/24/2021) 30 capsule 0   divalproex (DEPAKOTE ER) 500 MG 24 hr tablet Take 1 tablet (500 mg total) by mouth 2 (two) times daily. 60 tablet 0   glucose blood test strip Use as directed up to four times daily 50 each 0   haloperidol (HALDOL) 10 MG tablet Take 1 tablet (10 mg total) by mouth 3 (three) times daily as needed for agitation (and psychotic symptoms).     INGREZZA 40 MG capsule Take 1 capsule (40 mg total) by mouth in the morning. 30 capsule 0   insulin aspart (NOVOLOG) 100 UNIT/ML FlexPen Before each meal 3 times a day, 140-199 - 2 units, 200-250 - 4 units, 251-299 - 6 units,  300-349 - 8 units,  350 or above 10 units. Insulin PEN if approved, provide syringes and needles if needed.Please switch to any approved short acting Insulin if needed. 15 mL 0   insulin glargine  (LANTUS) 100 UNIT/ML Solostar Pen Inject 12 Units into the skin daily. 15 mL 0   Insulin Pen Needle 32G X 4 MM MISC Use 4 times a day with insulin, 1 month supply. 100 each 0   latanoprost (XALATAN) 0.005 % ophthalmic solution Place 1 drop into both eyes at bedtime.     metFORMIN (GLUCOPHAGE) 500 MG tablet Take 500 mg by mouth 2 (two) times daily with a meal.     nicotine (NICODERM CQ - DOSED IN MG/24 HR) 7 mg/24hr patch Place 1 patch (7 mg total) onto the skin daily. 28 patch 0   PROAIR HFA 108 (90 Base) MCG/ACT inhaler Inhale 2 puffs into the lungs every 6 (six) hours as needed for wheezing or shortness of breath.      Labs  Lab Results:  Admission on 02/18/2022  Component Date Value Ref Range Status   Glucose-Capillary 02/18/2022 254 (H)  70 - 99 mg/dL Final   Glucose reference range applies only to samples taken after fasting for at least 8 hours.   Glucose-Capillary 02/18/2022 112 (H)  70 - 99 mg/dL Final   Glucose reference range applies only to samples taken after fasting for at least 8 hours.   Glucose-Capillary 02/19/2022 159 (H)  70 - 99 mg/dL Final   Glucose reference range applies only to samples taken after fasting for at least 8 hours.   Glucose-Capillary 02/19/2022 160 (H)  70 - 99 mg/dL Final   Glucose reference range applies only to samples taken after fasting for at least 8 hours.   Glucose-Capillary 02/19/2022 177 (H)  70 - 99 mg/dL Final   Glucose reference range applies only to samples taken  after fasting for at least 8 hours.   Glucose-Capillary 02/19/2022 147 (H)  70 - 99 mg/dL Final   Glucose reference range applies only to samples taken after fasting for at least 8 hours.   Glucose-Capillary 02/20/2022 126 (H)  70 - 99 mg/dL Final   Glucose reference range applies only to samples taken after fasting for at least 8 hours.   Glucose-Capillary 02/20/2022 132 (H)  70 - 99 mg/dL Final   Glucose reference range applies only to samples taken after fasting for at least 8  hours.   Glucose-Capillary 02/20/2022 125 (H)  70 - 99 mg/dL Final   Glucose reference range applies only to samples taken after fasting for at least 8 hours.   Glucose-Capillary 02/21/2022 151 (H)  70 - 99 mg/dL Final   Glucose reference range applies only to samples taken after fasting for at least 8 hours.   Glucose-Capillary 02/21/2022 106 (H)  70 - 99 mg/dL Final   Glucose reference range applies only to samples taken after fasting for at least 8 hours.   Glucose-Capillary 02/21/2022 149 (H)  70 - 99 mg/dL Final   Glucose reference range applies only to samples taken after fasting for at least 8 hours.   Glucose-Capillary 02/22/2022 131 (H)  70 - 99 mg/dL Final   Glucose reference range applies only to samples taken after fasting for at least 8 hours.   Glucose-Capillary 02/22/2022 141 (H)  70 - 99 mg/dL Final   Glucose reference range applies only to samples taken after fasting for at least 8 hours.   Glucose-Capillary 02/22/2022 183 (H)  70 - 99 mg/dL Final   Glucose reference range applies only to samples taken after fasting for at least 8 hours.   Glucose-Capillary 02/23/2022 133 (H)  70 - 99 mg/dL Final   Glucose reference range applies only to samples taken after fasting for at least 8 hours.   WBC 02/23/2022 4.8  4.0 - 10.5 K/uL Final   RBC 02/23/2022 3.96  3.87 - 5.11 MIL/uL Final   Hemoglobin 02/23/2022 9.7 (L)  12.0 - 15.0 g/dL Final   HCT 08/65/784612/26/2023 30.6 (L)  36.0 - 46.0 % Final   MCV 02/23/2022 77.3 (L)  80.0 - 100.0 fL Final   MCH 02/23/2022 24.5 (L)  26.0 - 34.0 pg Final   MCHC 02/23/2022 31.7  30.0 - 36.0 g/dL Final   RDW 96/29/528412/26/2023 16.5 (H)  11.5 - 15.5 % Final   Platelets 02/23/2022 372  150 - 400 K/uL Final   nRBC 02/23/2022 0.0  0.0 - 0.2 % Final   Neutrophils Relative % 02/23/2022 56  % Final   Neutro Abs 02/23/2022 2.7  1.7 - 7.7 K/uL Final   Lymphocytes Relative 02/23/2022 32  % Final   Lymphs Abs 02/23/2022 1.5  0.7 - 4.0 K/uL Final   Monocytes Relative  02/23/2022 9  % Final   Monocytes Absolute 02/23/2022 0.4  0.1 - 1.0 K/uL Final   Eosinophils Relative 02/23/2022 2  % Final   Eosinophils Absolute 02/23/2022 0.1  0.0 - 0.5 K/uL Final   Basophils Relative 02/23/2022 1  % Final   Basophils Absolute 02/23/2022 0.1  0.0 - 0.1 K/uL Final   Immature Granulocytes 02/23/2022 0  % Final   Abs Immature Granulocytes 02/23/2022 0.01  0.00 - 0.07 K/uL Final   Performed at [facility], [address] [place], [place] [id number]   Glucose-Capillary 02/23/2022 202 (H)  70 - 99 mg/dL Final  Glucose reference range applies only to samples taken after fasting for at least 8 hours.   Glucose-Capillary 02/23/2022 143 (H)  70 - 99 mg/dL Final   Glucose reference range applies only to samples taken after fasting for at least 8 hours.   Glucose-Capillary 02/23/2022 143 (H)  70 - 99 mg/dL Final   Glucose reference range applies only to samples taken after fasting for at least 8 hours.   Glucose-Capillary 02/24/2022 207 (H)  70 - 99 mg/dL Final   Glucose reference range applies only to samples taken after fasting for at least 8 hours.   Glucose-Capillary 02/24/2022 109 (H)  70 - 99 mg/dL Final   Glucose reference range applies only to samples taken after fasting for at least 8 hours.   Glucose-Capillary 02/24/2022 127 (H)  70 - 99 mg/dL Final   Glucose reference range applies only to samples taken after fasting for at least 8 hours.   Glucose-Capillary 02/24/2022 130 (H)  70 - 99 mg/dL Final   Glucose reference range applies only to samples taken after fasting for at least 8 hours.   Glucose-Capillary 02/25/2022 117 (H)  70 - 99 mg/dL Final   Glucose reference range applies only to samples taken after fasting for at least 8 hours.   Glucose-Capillary 02/25/2022 104 (H)  70 - 99 mg/dL Final   Glucose reference range applies only to samples taken after fasting for at least 8 hours.   Glucose-Capillary 02/25/2022 180 (H)  70 - 99 mg/dL Final   Glucose  reference range applies only to samples taken after fasting for at least 8 hours.   Glucose-Capillary 02/25/2022 180 (H)  70 - 99 mg/dL Final   Glucose reference range applies only to samples taken after fasting for at least 8 hours.   Glucose-Capillary 02/26/2022 133 (H)  70 - 99 mg/dL Final   Glucose reference range applies only to samples taken after fasting for at least 8 hours.   Glucose-Capillary 02/26/2022 85  70 - 99 mg/dL Final   Glucose reference range applies only to samples taken after fasting for at least 8 hours.   Glucose-Capillary 02/26/2022 136 (H)  70 - 99 mg/dL Final   Glucose reference range applies only to samples taken after fasting for at least 8 hours.   Glucose-Capillary 02/26/2022 170 (H)  70 - 99 mg/dL Final   Glucose reference range applies only to samples taken after fasting for at least 8 hours.   Glucose-Capillary 02/26/2022 185 (H)  70 - 99 mg/dL Final   Glucose reference range applies only to samples taken after fasting for at least 8 hours.   Glucose-Capillary 02/27/2022 124 (H)  70 - 99 mg/dL Final   Glucose reference range applies only to samples taken after fasting for at least 8 hours.   Glucose-Capillary 02/27/2022 161 (H)  70 - 99 mg/dL Final   Glucose reference range applies only to samples taken after fasting for at least 8 hours.   Glucose-Capillary 02/27/2022 170 (H)  70 - 99 mg/dL Final   Glucose reference range applies only to samples taken after fasting for at least 8 hours.   Glucose-Capillary 02/27/2022 154 (H)  70 - 99 mg/dL Final   Glucose reference range applies only to samples taken after fasting for at least 8 hours.   Glucose-Capillary 02/28/2022 109 (H)  70 - 99 mg/dL Final   Glucose reference range applies only to samples taken after fasting for at least 8 hours.   Glucose-Capillary 02/28/2022 134 (  H)  70 - 99 mg/dL Final   Glucose reference range applies only to samples taken after fasting for at least 8 hours.   Glucose-Capillary  02/28/2022 160 (H)  70 - 99 mg/dL Final   Glucose reference range applies only to samples taken after fasting for at least 8 hours.   Glucose-Capillary 02/28/2022 136 (H)  70 - 99 mg/dL Final   Glucose reference range applies only to samples taken after fasting for at least 8 hours.   Glucose-Capillary 02/28/2022 173 (H)  70 - 99 mg/dL Final   Glucose reference range applies only to samples taken after fasting for at least 8 hours.   Glucose-Capillary 03/01/2022 134 (H)  70 - 99 mg/dL Final   Glucose reference range applies only to samples taken after fasting for at least 8 hours.   Glucose-Capillary 03/01/2022 116 (H)  70 - 99 mg/dL Final   Glucose reference range applies only to samples taken after fasting for at least 8 hours.   Glucose-Capillary 03/01/2022 227 (H)  70 - 99 mg/dL Final   Glucose reference range applies only to samples taken after fasting for at least 8 hours.   Glucose-Capillary 03/01/2022 157 (H)  70 - 99 mg/dL Final   Glucose reference range applies only to samples taken after fasting for at least 8 hours.   Glucose-Capillary 03/02/2022 99  70 - 99 mg/dL Final   Glucose reference range applies only to samples taken after fasting for at least 8 hours.   WBC 03/02/2022 6.0  4.0 - 10.5 K/uL Final   RBC 03/02/2022 4.15  3.87 - 5.11 MIL/uL Final   Hemoglobin 03/02/2022 9.7 (L)  12.0 - 15.0 g/dL Final   HCT 59/74/1638 31.5 (L)  36.0 - 46.0 % Final   MCV 03/02/2022 75.9 (L)  80.0 - 100.0 fL Final   MCH 03/02/2022 23.4 (L)  26.0 - 34.0 pg Final   MCHC 03/02/2022 30.8  30.0 - 36.0 g/dL Final   RDW 45/36/4680 16.4 (H)  11.5 - 15.5 % Final   Platelets 03/02/2022 395  150 - 400 K/uL Final   nRBC 03/02/2022 0.0  0.0 - 0.2 % Final   Neutrophils Relative % 03/02/2022 61  % Final   Neutro Abs 03/02/2022 3.6  1.7 - 7.7 K/uL Final   Lymphocytes Relative 03/02/2022 29  % Final   Lymphs Abs 03/02/2022 1.7  0.7 - 4.0 K/uL Final   Monocytes Relative 03/02/2022 8  % Final   Monocytes  Absolute 03/02/2022 0.5  0.1 - 1.0 K/uL Final   Eosinophils Relative 03/02/2022 1  % Final   Eosinophils Absolute 03/02/2022 0.1  0.0 - 0.5 K/uL Final   Basophils Relative 03/02/2022 1  % Final   Basophils Absolute 03/02/2022 0.1  0.0 - 0.1 K/uL Final   Immature Granulocytes 03/02/2022 0  % Final   Abs Immature Granulocytes 03/02/2022 0.01  0.00 - 0.07 K/uL Final   Performed at [facility], [address] [place], [place] [address]   Glucose-Capillary 03/02/2022 171 (H)  70 - 99 mg/dL Final   Glucose reference range applies only to samples taken after fasting for at least 8 hours.   Glucose-Capillary 03/02/2022 108 (H)  70 - 99 mg/dL Final   Glucose reference range applies only to samples taken after fasting for at least 8 hours.   Glucose-Capillary 03/02/2022 203 (H)  70 - 99 mg/dL Final   Glucose reference range applies only to samples taken after fasting for at least  8 hours.   Glucose-Capillary 03/03/2022 128 (H)  70 - 99 mg/dL Final   Glucose reference range applies only to samples taken after fasting for at least 8 hours.   Glucose-Capillary 03/03/2022 134 (H)  70 - 99 mg/dL Final   Glucose reference range applies only to samples taken after fasting for at least 8 hours.   Glucose-Capillary 03/03/2022 100 (H)  70 - 99 mg/dL Final   Glucose reference range applies only to samples taken after fasting for at least 8 hours.   Glucose-Capillary 03/03/2022 160 (H)  70 - 99 mg/dL Final   Glucose reference range applies only to samples taken after fasting for at least 8 hours.   Glucose-Capillary 03/04/2022 116 (H)  70 - 99 mg/dL Final   Glucose reference range applies only to samples taken after fasting for at least 8 hours.   Glucose-Capillary 03/04/2022 104 (H)  70 - 99 mg/dL Final   Glucose reference range applies only to samples taken after fasting for at least 8 hours.   Glucose-Capillary 03/04/2022 156 (H)  70 - 99 mg/dL Final   Glucose reference range applies only to samples  taken after fasting for at least 8 hours.   Glucose-Capillary 03/04/2022 201 (H)  70 - 99 mg/dL Final   Glucose reference range applies only to samples taken after fasting for at least 8 hours.   Glucose-Capillary 03/05/2022 120 (H)  70 - 99 mg/dL Final   Glucose reference range applies only to samples taken after fasting for at least 8 hours.  Admission on 02/17/2022, Discharged on 02/18/2022  Component Date Value Ref Range Status   Sodium 02/17/2022 136  135 - 145 mmol/L Final   Potassium 02/17/2022 3.9  3.5 - 5.1 mmol/L Final   Chloride 02/17/2022 101  98 - 111 mmol/L Final   CO2 02/17/2022 25  22 - 32 mmol/L Final   Glucose, Bld 02/17/2022 113 (H)  70 - 99 mg/dL Final   Glucose reference range applies only to samples taken after fasting for at least 8 hours.   BUN 02/17/2022 9  6 - 20 mg/dL Final   Creatinine, Ser 02/17/2022 0.50  0.44 - 1.00 mg/dL Final   Calcium 16/11/9602 8.9  8.9 - 10.3 mg/dL Final   GFR, Estimated 02/17/2022 >60  >60 mL/min Final   Comment: (NOTE) Calculated using the CKD-EPI Creatinine Equation (2021)    Anion gap 02/17/2022 10  5 - 15 Final   Performed at [facility], [address] [place], [place] [address]   WBC 02/17/2022 6.5  4.0 - 10.5 K/uL Final   RBC 02/17/2022 4.24  3.87 - 5.11 MIL/uL Final   Hemoglobin 02/17/2022 10.2 (L)  12.0 - 15.0 g/dL Final   HCT 11/91/4782 33.2 (L)  36.0 - 46.0 % Final   MCV 02/17/2022 78.3 (L)  80.0 - 100.0 fL Final   MCH 02/17/2022 24.1 (L)  26.0 - 34.0 pg Final   MCHC 02/17/2022 30.7  30.0 - 36.0 g/dL Final   RDW 95/62/1308 17.2 (H)  11.5 - 15.5 % Final   Platelets 02/17/2022 390  150 - 400 K/uL Final   nRBC 02/17/2022 0.0  0.0 - 0.2 % Final   Performed at [facility], [address] [place], [place] [address]   Troponin I (High Sensitivity) 02/17/2022 4  <18 ng/L Final   Comment: (NOTE) Elevated high sensitivity troponin I (hsTnI) values and significant  changes across serial measurements may suggest  ACS but many  other  chronic and acute conditions are known to elevate hsTnI results.  Refer to the "Links" section for chest pain algorithms and additional  guidance. Performed at [facility], [address], [place], [place] [address]    Valproic Acid Lvl 02/17/2022 17 (L)  50.0 - 100.0 ug/mL Final   Performed at [facility], [address], [place], [place] [address]   B Natriuretic Peptide 02/17/2022 30.9  0.0 - 100.0 pg/mL Final   Performed at [facility], [address] [place], [place] [address]   Troponin I (High Sensitivity) 02/18/2022 4  <18 ng/L Final   Comment: (NOTE) Elevated high sensitivity troponin I (hsTnI) values and significant  changes across serial measurements may suggest ACS but many other  chronic and acute conditions are known to elevate hsTnI results.  Refer to the "Links" section for chest pain algorithms and additional  guidance. Performed at [facility], [address], [place], [place] [address]    D-Dimer, Quant 02/18/2022 0.29  0.00 - 0.50 ug/mL-FEU Final   Comment: (NOTE) At the manufacturer cut-off value of 0.5 g/mL FEU, this assay has a negative predictive value of 95-100%.This assay is intended for use in conjunction with a clinical pretest probability (PTP) assessment model to exclude pulmonary embolism (PE) and deep venous thrombosis (DVT) in outpatients suspected of PE or DVT. Results should be correlated with clinical presentation. Performed at [facility], [address], [place], [place] [address]   Admission on 02/09/2022, Discharged on 02/17/2022  Component Date Value Ref Range Status   Glucose-Capillary 02/09/2022 118 (H)  70 - 99 mg/dL Final   Glucose reference range applies only to samples taken after fasting for at least 8 hours.   Glucose-Capillary 02/10/2022 148 (H)  70 - 99 mg/dL Final   Glucose reference range applies only to samples taken after fasting for at least 8 hours.    Glucose-Capillary 02/10/2022 174 (H)  70 - 99 mg/dL Final   Glucose reference range applies only to samples taken after fasting for at least 8 hours.   Glucose-Capillary 02/10/2022 128 (H)  70 - 99 mg/dL Final   Glucose reference range applies only to samples taken after fasting for at least 8 hours.   Glucose-Capillary 02/10/2022 182 (H)  70 - 99 mg/dL Final   Glucose reference range applies only to samples taken after fasting for at least 8 hours.   Glucose-Capillary 02/11/2022 158 (H)  70 - 99 mg/dL Final   Glucose reference range applies only to samples taken after fasting for at least 8 hours.   Glucose-Capillary 02/11/2022 159 (H)  70 - 99 mg/dL Final   Glucose reference range applies only to samples taken after fasting for at least 8 hours.   Glucose-Capillary 02/11/2022 153 (H)  70 - 99 mg/dL Final   Glucose reference range applies only to samples taken after fasting for at least 8 hours.   Glucose-Capillary 02/11/2022 159 (H)  70 - 99 mg/dL Final   Glucose reference range applies only to samples taken after fasting for at least 8 hours.   Glucose-Capillary 02/11/2022 153 (H)  70 - 99 mg/dL Final   Glucose reference range applies only to samples taken after fasting for at least 8 hours.   Glucose-Capillary 02/11/2022 109 (H)  70 - 99 mg/dL Final   Glucose reference range applies only to samples taken after fasting for at least 8 hours.   Glucose-Capillary 02/11/2022 213 (H)  70 - 99 mg/dL Final   Glucose reference range applies only to samples taken after fasting for at least 8 hours.   Glucose-Capillary 02/11/2022 229 (H)  70 - 99 mg/dL Final   Glucose reference range applies only to samples taken after fasting for at least 8 hours.   Glucose-Capillary 02/12/2022 128 (H)  70 - 99 mg/dL Final   Glucose reference range applies only to samples taken after fasting for at least 8 hours.   Glucose-Capillary 02/12/2022 149 (H)  70 - 99 mg/dL Final   Glucose reference range applies only to  samples taken after fasting for at least 8 hours.   Color, Urine 02/12/2022 YELLOW  YELLOW Final   APPearance 02/12/2022 CLEAR  CLEAR Final   Specific Gravity, Urine 02/12/2022 1.015  1.005 - 1.030 Final   pH 02/12/2022 5.0  5.0 - 8.0 Final   Glucose, UA 02/12/2022 NEGATIVE  NEGATIVE mg/dL Final   Hgb urine dipstick 02/12/2022 NEGATIVE  NEGATIVE Final   Bilirubin Urine 02/12/2022 NEGATIVE  NEGATIVE Final   Ketones, ur 02/12/2022 NEGATIVE  NEGATIVE mg/dL Final   Protein, ur 77/82/4235 NEGATIVE  NEGATIVE mg/dL Final   Nitrite 36/14/4315 NEGATIVE  NEGATIVE Final   Leukocytes,Ua 02/12/2022 NEGATIVE  NEGATIVE Final   Performed at [facility], [address], [place], [place] [address]   Specimen Description 02/12/2022 URINE, CLEAN CATCH   Final   Special Requests 02/12/2022    Final                   Value:NONE Performed at [facility], [address] [place], [place] [address]    Culture 02/12/2022 10,000 COLONIES/mL PROTEUS MIRABILIS (A)   Final   Report Status 02/12/2022 02/15/2022 FINAL   Final   Organism ID, Bacteria 02/12/2022 PROTEUS MIRABILIS (A)   Final   Glucose-Capillary 02/12/2022 128 (H)  70 - 99 mg/dL Final   Glucose reference range applies only to samples taken after fasting for at least 8 hours.   Glucose-Capillary 02/12/2022 196 (H)  70 - 99 mg/dL Final   Glucose reference range applies only to samples taken after fasting for at least 8 hours.   Glucose-Capillary 02/13/2022 168 (H)  70 - 99 mg/dL Final   Glucose reference range applies only to samples taken after fasting for at least 8 hours.   Glucose-Capillary 02/13/2022 153 (H)  70 - 99 mg/dL Final   Glucose reference range applies only to samples taken after fasting for at least 8 hours.   Glucose-Capillary 02/13/2022 134 (H)  70 - 99 mg/dL Final   Glucose reference range applies only to samples taken after fasting for at least 8 hours.   Glucose-Capillary 02/13/2022 178 (H)  70 - 99 mg/dL Final    Glucose reference range applies only to samples taken after fasting for at least 8 hours.   Glucose-Capillary 02/14/2022 156 (H)  70 - 99 mg/dL Final   Glucose reference range applies only to samples taken after fasting for at least 8 hours.   Glucose-Capillary 02/14/2022 169 (H)  70 - 99 mg/dL Final   Glucose reference range applies only to samples taken after fasting for at least 8 hours.   Glucose-Capillary 02/14/2022 156 (H)  70 - 99 mg/dL Final   Glucose reference range applies only to samples taken after fasting for at least 8 hours.   Glucose-Capillary 02/14/2022 148 (H)  70 - 99 mg/dL Final   Glucose reference range applies only to samples taken  after fasting for at least 8 hours.   Glucose-Capillary 02/15/2022 159 (H)  70 - 99 mg/dL Final   Glucose reference range applies only to samples taken after fasting for at least 8 hours.   Glucose-Capillary 02/15/2022 125 (H)  70 - 99 mg/dL Final   Glucose reference range applies only to samples taken after fasting for at least 8 hours.   Glucose-Capillary 02/15/2022 142 (H)  70 - 99 mg/dL Final   Glucose reference range applies only to samples taken after fasting for at least 8 hours.   Glucose-Capillary 02/15/2022 128 (H)  70 - 99 mg/dL Final   Glucose reference range applies only to samples taken after fasting for at least 8 hours.   Glucose-Capillary 02/16/2022 137 (H)  70 - 99 mg/dL Final   Glucose reference range applies only to samples taken after fasting for at least 8 hours.   WBC 02/16/2022 6.0  4.0 - 10.5 K/uL Final   RBC 02/16/2022 4.48  3.87 - 5.11 MIL/uL Final   Hemoglobin 02/16/2022 10.7 (L)  12.0 - 15.0 g/dL Final   HCT 94/17/4081 35.4 (L)  36.0 - 46.0 % Final   MCV 02/16/2022 79.0 (L)  80.0 - 100.0 fL Final   MCH 02/16/2022 23.9 (L)  26.0 - 34.0 pg Final   MCHC 02/16/2022 30.2  30.0 - 36.0 g/dL Final   RDW 44/81/8563 17.0 (H)  11.5 - 15.5 % Final   Platelets 02/16/2022 387  150 - 400 K/uL Final   nRBC 02/16/2022 0.0  0.0  - 0.2 % Final   Neutrophils Relative % 02/16/2022 54  % Final   Neutro Abs 02/16/2022 3.2  1.7 - 7.7 K/uL Final   Lymphocytes Relative 02/16/2022 35  % Final   Lymphs Abs 02/16/2022 2.1  0.7 - 4.0 K/uL Final   Monocytes Relative 02/16/2022 6  % Final   Monocytes Absolute 02/16/2022 0.4  0.1 - 1.0 K/uL Final   Eosinophils Relative 02/16/2022 2  % Final   Eosinophils Absolute 02/16/2022 0.1  0.0 - 0.5 K/uL Final   Basophils Relative 02/16/2022 2  % Final   Basophils Absolute 02/16/2022 0.1  0.0 - 0.1 K/uL Final   Immature Granulocytes 02/16/2022 1  % Final   Abs Immature Granulocytes 02/16/2022 0.04  0.00 - 0.07 K/uL Final   Performed at [facility], [address] [place], [place] [address]   Glucose-Capillary 02/16/2022 131 (H)  70 - 99 mg/dL Final   Glucose reference range applies only to samples taken after fasting for at least 8 hours.   Glucose-Capillary 02/16/2022 123 (H)  70 - 99 mg/dL Final   Glucose reference range applies only to samples taken after fasting for at least 8 hours.   Glucose-Capillary 02/16/2022 195 (H)  70 - 99 mg/dL Final   Glucose reference range applies only to samples taken after fasting for at least 8 hours.   Glucose-Capillary 02/17/2022 148 (H)  70 - 99 mg/dL Final   Glucose reference range applies only to samples taken after fasting for at least 8 hours.  Admission on 02/08/2022, Discharged on 02/09/2022  Component Date Value Ref Range Status   B Natriuretic Peptide 02/08/2022 40.2  0.0 - 100.0 pg/mL Final   Performed at [facility], [address], [place], [place] [address]   Troponin I (High Sensitivity) 02/08/2022 4  <18 ng/L Final   Comment: (NOTE) Elevated high sensitivity troponin I (hsTnI) values and significant  changes across serial measurements may  suggest ACS but many other  chronic and acute conditions are known to elevate hsTnI results.  Refer to the "Links" section for chest pain algorithms and additional   guidance. Performed at [facility], [address], [place], [place] [address]    WBC 02/08/2022 8.5  4.0 - 10.5 K/uL Final   RBC 02/08/2022 4.30  3.87 - 5.11 MIL/uL Final   Hemoglobin 02/08/2022 10.7 (L)  12.0 - 15.0 g/dL Final   HCT 52/84/1324 33.1 (L)  36.0 - 46.0 % Final   MCV 02/08/2022 77.0 (L)  80.0 - 100.0 fL Final   MCH 02/08/2022 24.9 (L)  26.0 - 34.0 pg Final   MCHC 02/08/2022 32.3  30.0 - 36.0 g/dL Final   RDW 40/11/2723 16.5 (H)  11.5 - 15.5 % Final   Platelets 02/08/2022 361  150 - 400 K/uL Final   nRBC 02/08/2022 0.0  0.0 - 0.2 % Final   Performed at [facility], [address], [place], [place] [address]   Sodium 02/08/2022 131 (L)  135 - 145 mmol/L Final   Potassium 02/08/2022 4.4  3.5 - 5.1 mmol/L Final   Chloride 02/08/2022 96 (L)  98 - 111 mmol/L Final   CO2 02/08/2022 25  22 - 32 mmol/L Final   Glucose, Bld 02/08/2022 126 (H)  70 - 99 mg/dL Final   Glucose reference range applies only to samples taken after fasting for at least 8 hours.   BUN 02/08/2022 11  6 - 20 mg/dL Final   Creatinine, Ser 02/08/2022 0.52  0.44 - 1.00 mg/dL Final   Calcium 03/47/4259 9.4  8.9 - 10.3 mg/dL Final   Total Protein 56/38/7564 7.5  6.5 - 8.1 g/dL Final   Albumin 33/29/5188 3.6  3.5 - 5.0 g/dL Final   AST 41/66/0630 22  15 - 41 U/L Final   ALT 02/08/2022 20  0 - 44 U/L Final   Alkaline Phosphatase 02/08/2022 67  38 - 126 U/L Final   Total Bilirubin 02/08/2022 0.1 (L)  0.3 - 1.2 mg/dL Final   GFR, Estimated 02/08/2022 >60  >60 mL/min Final   Comment: (NOTE) Calculated using the CKD-EPI Creatinine Equation (2021)    Anion gap 02/08/2022 10  5 - 15 Final   Performed at [facility], [address] [place], [place] [address]   Troponin I (High Sensitivity) 02/08/2022 5  <18 ng/L Final   Comment: (NOTE) Elevated high sensitivity troponin I (hsTnI) values and significant  changes across serial measurements may suggest ACS but many other  chronic and acute  conditions are known to elevate hsTnI results.  Refer to the "Links" section for chest pain algorithms and additional  guidance. Performed at [facility], [address], [place], [place] [address]    WBC 02/09/2022 7.3  4.0 - 10.5 K/uL Final   RBC 02/09/2022 4.70  3.87 - 5.11 MIL/uL Final   Hemoglobin 02/09/2022 11.4 (L)  12.0 - 15.0 g/dL Final   HCT 57/32/2025 36.6  36.0 - 46.0 % Final   MCV 02/09/2022 77.9 (L)  80.0 - 100.0 fL Final   MCH 02/09/2022 24.3 (L)  26.0 - 34.0 pg Final   MCHC 02/09/2022 31.1  30.0 - 36.0 g/dL Final   RDW 42/70/6237 16.6 (H)  11.5 - 15.5 % Final   Platelets 02/09/2022 403 (H)  150 - 400 K/uL Final   nRBC 02/09/2022 0.0  0.0 - 0.2 % Final   Performed at [facility], [address], [place], [place] [address]   Glucose-Capillary 02/08/2022 117 (H)  70 - 99 mg/dL Final   Glucose reference range applies only to samples taken after fasting for at least 8 hours.   Sodium 02/09/2022 138  135 - 145 mmol/L Final   Potassium 02/09/2022 3.7  3.5 - 5.1 mmol/L Final   Chloride 02/09/2022 103  98 - 111 mmol/L Final   CO2 02/09/2022 24  22 - 32 mmol/L Final   Glucose, Bld 02/09/2022 134 (H)  70 - 99 mg/dL Final   Glucose reference range applies only to samples taken after fasting for at least 8 hours.   BUN 02/09/2022 9  6 - 20 mg/dL Final   Creatinine, Ser 02/09/2022 0.44  0.44 - 1.00 mg/dL Final   Calcium 60/45/4098 8.3 (L)  8.9 - 10.3 mg/dL Final   GFR, Estimated 02/09/2022 >60  >60 mL/min Final   Comment: (NOTE) Calculated using the CKD-EPI Creatinine Equation (2021)    Anion gap 02/09/2022 11  5 - 15 Final   Performed at [facility], [address], [place], [place] [address]   Iron 02/09/2022 20 (L)  28 - 170 ug/dL Final   TIBC 78/29/5621 455 (H)  250 - 450 ug/dL Final   Saturation Ratios 02/09/2022 4 (L)  10.4 - 31.8 % Final   UIBC 02/09/2022 435  ug/dL Final   Performed at [facility], [address] [place], [place] [address]    Ferritin 02/09/2022 2 (L)  11 - 307 ng/mL Final   Performed at [facility], [address], [place], [place] [address]   Weight 02/09/2022 2,846.58  oz Final   Height 02/09/2022 62  in Final   BP 02/09/2022 143/80  mmHg Final   WBC 02/09/2022 8.1  4.0 - 10.5 K/uL Final   RBC 02/09/2022 4.38  3.87 - 5.11 MIL/uL Final   Hemoglobin 02/09/2022 10.9 (L)  12.0 - 15.0 g/dL Final   HCT 62/95/2841 33.3 (L)  36.0 - 46.0 % Final   MCV 02/09/2022 76.0 (L)  80.0 - 100.0 fL Final   MCH 02/09/2022 24.9 (L)  26.0 - 34.0 pg Final   MCHC 02/09/2022 32.7  30.0 - 36.0 g/dL Final   RDW 32/44/0102 16.5 (H)  11.5 - 15.5 % Final   Platelets 02/09/2022 397  150 - 400 K/uL Final   nRBC 02/09/2022 0.0  0.0 - 0.2 % Final   Neutrophils Relative % 02/09/2022 66  % Final   Neutro Abs 02/09/2022 5.4  1.7 - 7.7 K/uL Final   Lymphocytes Relative 02/09/2022 24  % Final   Lymphs Abs 02/09/2022 1.9  0.7 - 4.0 K/uL Final   Monocytes Relative 02/09/2022 8  % Final   Monocytes Absolute 02/09/2022 0.6  0.1 - 1.0 K/uL Final   Eosinophils Relative 02/09/2022 1  % Final   Eosinophils Absolute 02/09/2022 0.1  0.0 - 0.5 K/uL Final   Basophils Relative 02/09/2022 1  % Final   Basophils Absolute 02/09/2022 0.1  0.0 - 0.1 K/uL Final   Immature Granulocytes 02/09/2022 0  % Final   Abs Immature Granulocytes 02/09/2022 0.03  0.00 - 0.07 K/uL Final   Performed at [facility], [address] [place], [place] [address]   Glucose-Capillary 02/09/2022 238 (H)  70 - 99 mg/dL Final   Glucose reference range applies only to samples taken after fasting for at least 8 hours.   Glucose-Capillary 02/09/2022 91  70 - 99 mg/dL Final   Glucose reference range applies only to samples taken after fasting for at least 8 hours.   CRP 02/09/2022 <0.5  <1.0 mg/dL Final   Performed at [facility], [address], [place], [place] [address]   Sed Rate 02/09/2022 15  0 - 22 mm/hr Final   Performed at [facility], [address], [place], [place] [address]   Glucose-Capillary 02/09/2022 137 (H)  70 - 99 mg/dL Final   Glucose reference range applies only to samples taken after fasting for at least 8 hours.  Admission on 01/24/2022, Discharged on 02/08/2022  Component Date Value Ref Range Status   Glucose-Capillary 01/24/2022 143 (H)  70 - 99 mg/dL Final   Glucose reference range applies only to samples taken after fasting for at least 8 hours.   Glucose-Capillary 01/24/2022 120 (H)  70 - 99 mg/dL Final   Glucose reference range applies only to samples taken after fasting for at least 8 hours.   Glucose-Capillary 01/25/2022 122 (H)  70 - 99 mg/dL Final   Glucose reference range applies only to samples taken after fasting for at least 8 hours.   Glucose-Capillary 01/25/2022 190 (H)  70 - 99 mg/dL Final   Glucose reference range applies only to samples taken after fasting for at least 8 hours.   Glucose-Capillary 01/25/2022 163 (H)  70 - 99 mg/dL Final   Glucose reference range applies only to samples taken after fasting for at least 8 hours.   WBC 01/26/2022 6.5  4.0 - 10.5 K/uL Final   RBC 01/26/2022 4.53  3.87 - 5.11 MIL/uL Final   Hemoglobin 01/26/2022 11.3 (L)  12.0 - 15.0 g/dL Final   HCT 44/03/270 36.4  36.0 - 46.0 % Final   MCV 01/26/2022 80.4  80.0 - 100.0 fL Final   MCH 01/26/2022 24.9 (L)  26.0 - 34.0 pg Final   MCHC 01/26/2022 31.0  30.0 - 36.0 g/dL Final   RDW 53/66/4403 17.3 (H)  11.5 - 15.5 % Final   Platelets 01/26/2022 327  150 - 400 K/uL Final   nRBC 01/26/2022 0.0  0.0 - 0.2 % Final   Neutrophils Relative % 01/26/2022 60  % Final   Neutro Abs 01/26/2022 3.9  1.7 - 7.7 K/uL Final   Lymphocytes Relative 01/26/2022 31  % Final   Lymphs Abs 01/26/2022 2.0  0.7 - 4.0 K/uL Final   Monocytes Relative 01/26/2022 7  % Final   Monocytes Absolute 01/26/2022 0.5  0.1 - 1.0 K/uL Final   Eosinophils Relative 01/26/2022 1  % Final   Eosinophils Absolute 01/26/2022 0.1  0.0 - 0.5 K/uL Final   Basophils  Relative 01/26/2022 1  % Final   Basophils Absolute 01/26/2022 0.1  0.0 - 0.1 K/uL Final   Immature Granulocytes 01/26/2022 0  % Final   Abs Immature Granulocytes 01/26/2022 0.02  0.00 - 0.07 K/uL Final   Performed at [facility], [address], [place], [place] [address]   Glucose-Capillary 01/26/2022 149 (H)  70 - 99 mg/dL Final   Glucose reference range applies only to samples taken after fasting for at least 8 hours.   Glucose-Capillary 01/26/2022 130 (H)  70 - 99 mg/dL Final   Glucose reference range applies only to samples taken after fasting for at least 8 hours.   Glucose-Capillary 01/26/2022 151 (H)  70 - 99 mg/dL Final   Glucose reference range applies only to samples taken after  fasting for at least 8 hours.   Glucose-Capillary 01/26/2022 170 (H)  70 - 99 mg/dL Final   Glucose reference range applies only to samples taken after fasting for at least 8 hours.   Glucose-Capillary 01/27/2022 129 (H)  70 - 99 mg/dL Final   Glucose reference range applies only to samples taken after fasting for at least 8 hours.   Glucose-Capillary 01/27/2022 101 (H)  70 - 99 mg/dL Final   Glucose reference range applies only to samples taken after fasting for at least 8 hours.   Glucose-Capillary 01/27/2022 220 (H)  70 - 99 mg/dL Final   Glucose reference range applies only to samples taken after fasting for at least 8 hours.   Glucose-Capillary 01/27/2022 154 (H)  70 - 99 mg/dL Final   Glucose reference range applies only to samples taken after fasting for at least 8 hours.   Glucose-Capillary 01/27/2022 163 (H)  70 - 99 mg/dL Final   Glucose reference range applies only to samples taken after fasting for at least 8 hours.   Glucose-Capillary 01/28/2022 127 (H)  70 - 99 mg/dL Final   Glucose reference range applies only to samples taken after fasting for at least 8 hours.   Glucose-Capillary 01/28/2022 110 (H)  70 - 99 mg/dL Final   Glucose reference range applies only to samples taken after  fasting for at least 8 hours.   Glucose-Capillary 01/28/2022 167 (H)  70 - 99 mg/dL Final   Glucose reference range applies only to samples taken after fasting for at least 8 hours.   Glucose-Capillary 01/29/2022 145 (H)  70 - 99 mg/dL Final   Glucose reference range applies only to samples taken after fasting for at least 8 hours.   Glucose-Capillary 01/29/2022 203 (H)  70 - 99 mg/dL Final   Glucose reference range applies only to samples taken after fasting for at least 8 hours.   Glucose-Capillary 01/29/2022 153 (H)  70 - 99 mg/dL Final   Glucose reference range applies only to samples taken after fasting for at least 8 hours.   Glucose-Capillary 01/29/2022 166 (H)  70 - 99 mg/dL Final   Glucose reference range applies only to samples taken after fasting for at least 8 hours.   Glucose-Capillary 01/30/2022 142 (H)  70 - 99 mg/dL Final   Glucose reference range applies only to samples taken after fasting for at least 8 hours.   Glucose-Capillary 01/30/2022 138 (H)  70 - 99 mg/dL Final   Glucose reference range applies only to samples taken after fasting for at least 8 hours.   Glucose-Capillary 01/30/2022 185 (H)  70 - 99 mg/dL Final   Glucose reference range applies only to samples taken after fasting for at least 8 hours.   Glucose-Capillary 01/30/2022 220 (H)  70 - 99 mg/dL Final   Glucose reference range applies only to samples taken after fasting for at least 8 hours.   Glucose-Capillary 01/31/2022 147 (H)  70 - 99 mg/dL Final   Glucose reference range applies only to samples taken after fasting for at least 8 hours.   Glucose-Capillary 01/31/2022 131 (H)  70 - 99 mg/dL Final   Glucose reference range applies only to samples taken after fasting for at least 8 hours.   Glucose-Capillary 01/31/2022 169 (H)  70 - 99 mg/dL Final   Glucose reference range applies only to samples taken after fasting for at least 8 hours.   Glucose-Capillary 01/31/2022 144 (H)  70 - 99 mg/dL Final  Glucose  reference range applies only to samples taken after fasting for at least 8 hours.   Comment 1 01/31/2022 RN AWARE   Final   Glucose-Capillary 02/01/2022 117 (H)  70 - 99 mg/dL Final   Glucose reference range applies only to samples taken after fasting for at least 8 hours.   Glucose-Capillary 02/01/2022 180 (H)  70 - 99 mg/dL Final   Glucose reference range applies only to samples taken after fasting for at least 8 hours.   Glucose-Capillary 02/01/2022 209 (H)  70 - 99 mg/dL Final   Glucose reference range applies only to samples taken after fasting for at least 8 hours.   Glucose-Capillary 02/02/2022 131 (H)  70 - 99 mg/dL Final   Glucose reference range applies only to samples taken after fasting for at least 8 hours.   WBC 02/02/2022 7.5  4.0 - 10.5 K/uL Final   RBC 02/02/2022 4.52  3.87 - 5.11 MIL/uL Final   Hemoglobin 02/02/2022 11.3 (L)  12.0 - 15.0 g/dL Final   HCT 88/41/6606 35.0 (L)  36.0 - 46.0 % Final   MCV 02/02/2022 77.4 (L)  80.0 - 100.0 fL Final   MCH 02/02/2022 25.0 (L)  26.0 - 34.0 pg Final   MCHC 02/02/2022 32.3  30.0 - 36.0 g/dL Final   RDW 30/16/0109 16.7 (H)  11.5 - 15.5 % Final   Platelets 02/02/2022 345  150 - 400 K/uL Final   nRBC 02/02/2022 0.0  0.0 - 0.2 % Final   Neutrophils Relative % 02/02/2022 63  % Final   Neutro Abs 02/02/2022 4.7  1.7 - 7.7 K/uL Final   Lymphocytes Relative 02/02/2022 28  % Final   Lymphs Abs 02/02/2022 2.1  0.7 - 4.0 K/uL Final   Monocytes Relative 02/02/2022 7  % Final   Monocytes Absolute 02/02/2022 0.5  0.1 - 1.0 K/uL Final   Eosinophils Relative 02/02/2022 1  % Final   Eosinophils Absolute 02/02/2022 0.1  0.0 - 0.5 K/uL Final   Basophils Relative 02/02/2022 1  % Final   Basophils Absolute 02/02/2022 0.1  0.0 - 0.1 K/uL Final   Immature Granulocytes 02/02/2022 0  % Final   Abs Immature Granulocytes 02/02/2022 0.03  0.00 - 0.07 K/uL Final   Performed at [facility], [address], [place], [place] [address]    Glucose-Capillary 02/02/2022 95  70 - 99 mg/dL Final   Glucose reference range applies only to samples taken after fasting for at least 8 hours.   Glucose-Capillary 02/02/2022 120 (H)  70 - 99 mg/dL Final   Glucose reference range applies only to samples taken after fasting for at least 8 hours.   Glucose-Capillary 02/02/2022 191 (H)  70 - 99 mg/dL Final   Glucose reference range applies only to samples taken after fasting for at least 8 hours.   Glucose-Capillary 02/03/2022 148 (H)  70 - 99 mg/dL Final   Glucose reference range applies only to samples taken after fasting for at least 8 hours.   Glucose-Capillary 02/03/2022 122 (H)  70 - 99 mg/dL Final   Glucose reference range applies only to samples taken after fasting for at least 8 hours.   Glucose-Capillary 02/03/2022 233 (H)  70 - 99 mg/dL Final   Glucose reference range applies only to samples taken after fasting for at least 8 hours.   Glucose-Capillary 02/04/2022 126 (H)  70 - 99 mg/dL Final   Glucose reference range applies only to samples taken after fasting for at  least 8 hours.   Glucose-Capillary 02/04/2022 117 (H)  70 - 99 mg/dL Final   Glucose reference range applies only to samples taken after fasting for at least 8 hours.   Glucose-Capillary 02/04/2022 135 (H)  70 - 99 mg/dL Final   Glucose reference range applies only to samples taken after fasting for at least 8 hours.   Glucose-Capillary 02/04/2022 197 (H)  70 - 99 mg/dL Final   Glucose reference range applies only to samples taken after fasting for at least 8 hours.   Glucose-Capillary 02/05/2022 170 (H)  70 - 99 mg/dL Final   Glucose reference range applies only to samples taken after fasting for at least 8 hours.   Glucose-Capillary 02/05/2022 107 (H)  70 - 99 mg/dL Final   Glucose reference range applies only to samples taken after fasting for at least 8 hours.   Glucose-Capillary 02/05/2022 184 (H)  70 - 99 mg/dL Final   Glucose reference range applies only to  samples taken after fasting for at least 8 hours.   Glucose-Capillary 02/05/2022 256 (H)  70 - 99 mg/dL Final   Glucose reference range applies only to samples taken after fasting for at least 8 hours.   Glucose-Capillary 02/06/2022 144 (H)  70 - 99 mg/dL Final   Glucose reference range applies only to samples taken after fasting for at least 8 hours.   Glucose-Capillary 02/06/2022 190 (H)  70 - 99 mg/dL Final   Glucose reference range applies only to samples taken after fasting for at least 8 hours.   Glucose-Capillary 02/06/2022 92  70 - 99 mg/dL Final   Glucose reference range applies only to samples taken after fasting for at least 8 hours.   Trichomonas 02/06/2022 Negative   Final   Bacterial Vaginitis-Urine 02/06/2022 Negative   Final   Molecular Comment 02/06/2022 For tests bacteria and/or candida, this specimen does not meet the   Final   Molecular Comment 02/06/2022 strict criteria set by the FDA. The result interpretation should be   Final   Molecular Comment 02/06/2022 considered in conjunction with the patient's clinical history.   Final   Comment 02/06/2022 Normal Reference Range Trichomonas - Negative   Final   Glucose-Capillary 02/06/2022 197 (H)  70 - 99 mg/dL Final   Glucose reference range applies only to samples taken after fasting for at least 8 hours.   Glucose-Capillary 02/07/2022 129 (H)  70 - 99 mg/dL Final   Glucose reference range applies only to samples taken after fasting for at least 8 hours.   Glucose-Capillary 02/07/2022 207 (H)  70 - 99 mg/dL Final   Glucose reference range applies only to samples taken after fasting for at least 8 hours.   Glucose-Capillary 02/07/2022 153 (H)  70 - 99 mg/dL Final   Glucose reference range applies only to samples taken after fasting for at least 8 hours.   Glucose-Capillary 02/08/2022 129 (H)  70 - 99 mg/dL Final   Glucose reference range applies only to samples taken after fasting for at least 8 hours.   Glucose-Capillary  02/08/2022 107 (H)  70 - 99 mg/dL Final   Glucose reference range applies only to samples taken after fasting for at least 8 hours.  Admission on 01/23/2022, Discharged on 01/24/2022  Component Date Value Ref Range Status   Sodium 01/23/2022 140  135 - 145 mmol/L Final   Potassium 01/23/2022 4.4  3.5 - 5.1 mmol/L Final   Chloride 01/23/2022 101  98 - 111 mmol/L Final  CO2 01/23/2022 25  22 - 32 mmol/L Final   Glucose, Bld 01/23/2022 198 (H)  70 - 99 mg/dL Final   Glucose reference range applies only to samples taken after fasting for at least 8 hours.   BUN 01/23/2022 13  6 - 20 mg/dL Final   Creatinine, Ser 01/23/2022 0.62  0.44 - 1.00 mg/dL Final   Calcium 16/11/9602 9.3  8.9 - 10.3 mg/dL Final   GFR, Estimated 01/23/2022 >60  >60 mL/min Final   Comment: (NOTE) Calculated using the CKD-EPI Creatinine Equation (2021)    Anion gap 01/23/2022 14  5 - 15 Final   Performed at [facility], [address], [place], [place] [address]   WBC 01/23/2022 5.8  4.0 - 10.5 K/uL Final   RBC 01/23/2022 4.63  3.87 - 5.11 MIL/uL Final   Hemoglobin 01/23/2022 11.7 (L)  12.0 - 15.0 g/dL Final   HCT 11/91/4782 37.4  36.0 - 46.0 % Final   MCV 01/23/2022 80.8  80.0 - 100.0 fL Final   MCH 01/23/2022 25.3 (L)  26.0 - 34.0 pg Final   MCHC 01/23/2022 31.3  30.0 - 36.0 g/dL Final   RDW 95/62/1308 17.9 (H)  11.5 - 15.5 % Final   Platelets 01/23/2022 333  150 - 400 K/uL Final   nRBC 01/23/2022 0.0  0.0 - 0.2 % Final   Performed at [facility], [address] [place], [place] [address]   Troponin I (High Sensitivity) 01/23/2022 6  <18 ng/L Final   Comment: (NOTE) Elevated high sensitivity troponin I (hsTnI) values and significant  changes across serial measurements may suggest ACS but many other  chronic and acute conditions are known to elevate hsTnI results.  Refer to the "Links" section for chest pain algorithms and additional  guidance. Performed at [facility], [address] [place], [place] [address]    Troponin I (High Sensitivity) 01/23/2022 5  <18 ng/L Final   Comment: (NOTE) Elevated high sensitivity troponin I (hsTnI) values and significant  changes across serial measurements may suggest ACS but many other  chronic and acute conditions are known to elevate hsTnI results.  Refer to the "Links" section for chest pain algorithms and additional  guidance. Performed at [facility], [address] [place], [place] [address]    SARS Coronavirus 2 by RT PCR 01/23/2022 NEGATIVE  NEGATIVE Final   Comment: (NOTE) SARS-CoV-2 target nucleic acids are NOT DETECTED.  The SARS-CoV-2 RNA is generally detectable in upper and lower respiratory specimens during the acute phase of infection. The lowest concentration of SARS-CoV-2 viral copies this assay can detect is 250 copies / mL. A negative result does not preclude SARS-CoV-2 infection and should not be used as the sole basis for treatment or other patient management decisions.  A negative result may occur with improper specimen collection / handling, submission of specimen other than nasopharyngeal swab, presence of viral mutation(s) within the areas targeted by this assay, and inadequate number of viral copies (<250 copies / mL). A negative result must be combined with clinical observations, patient history, and epidemiological information.  Fact Sheet for Patients:   RoadLapTop.co.za  Fact Sheet for Healthcare Providers: http://kim-miller.com/  This test is not yet approved or                           cleared by the [place] FDA and has been authorized for detection and/or diagnosis  of SARS-CoV-2 by FDA under an Emergency Use Authorization (EUA).  This EUA will remain in effect (meaning this test can be used) for the duration of the COVID-19 declaration under Section 564(b)(1) of the Act, 21 U.S.C. section 360bbb-3(b)(1), unless the authorization  is terminated or revoked sooner.  Performed at [facility], [address] [place], [place] [address]    B Natriuretic Peptide 01/23/2022 41.3  0.0 - 100.0 pg/mL Final   Performed at [facility], [address] [place], [place] [address]   Group A Strep by PCR 01/23/2022 NOT DETECTED  NOT DETECTED Final   Performed at [facility], [address] [place], [place] [address]   Color, Urine 01/23/2022 YELLOW  YELLOW Final   APPearance 01/23/2022 CLEAR  CLEAR Final   Specific Gravity, Urine 01/23/2022 1.018  1.005 - 1.030 Final   pH 01/23/2022 7.0  5.0 - 8.0 Final   Glucose, UA 01/23/2022 NEGATIVE  NEGATIVE mg/dL Final   Hgb urine dipstick 01/23/2022 NEGATIVE  NEGATIVE Final   Bilirubin Urine 01/23/2022 NEGATIVE  NEGATIVE Final   Ketones, ur 01/23/2022 NEGATIVE  NEGATIVE mg/dL Final   Protein, ur 91/47/829511/25/2023 NEGATIVE  NEGATIVE mg/dL Final   Nitrite 62/13/086511/25/2023 NEGATIVE  NEGATIVE Final   Leukocytes,Ua 01/23/2022 TRACE (A)  NEGATIVE Final   RBC / HPF 01/23/2022 0-5  0 - 5 RBC/hpf Final   WBC, UA 01/23/2022 0-5  0 - 5 WBC/hpf Final   Bacteria, UA 01/23/2022 NONE SEEN  NONE SEEN Final   Squamous Epithelial / HPF 01/23/2022 0-5  0 - 5 Final   Mucus 01/23/2022 PRESENT   Final   Performed at [facility], [address] [place], [place] [address]   SARS Coronavirus 2 by RT PCR 01/23/2022 NEGATIVE  NEGATIVE Final   Comment: (NOTE) SARS-CoV-2 target nucleic acids are NOT DETECTED.  The SARS-CoV-2 RNA is generally detectable in upper respiratory specimens during the acute phase of infection. The lowest concentration of SARS-CoV-2 viral copies this assay can detect is 138 copies/mL. A negative result does not preclude SARS-Cov-2 infection and should not be used as the sole basis for treatment or other patient management decisions. A negative result may occur with  improper specimen collection/handling, submission of specimen other than nasopharyngeal swab, presence  of viral mutation(s) within the areas targeted by this assay, and inadequate number of viral copies(<138 copies/mL). A negative result must be combined with clinical observations, patient history, and epidemiological information. The expected result is Negative.  Fact Sheet for Patients:  BloggerCourse.comhttps://www.fda.gov/media/152166/download  Fact Sheet for Healthcare Providers:  SeriousBroker.ithttps://www.fda.gov/media/152162/download  This test is no                          t yet approved or cleared by the [place] FDA and  has been authorized for detection and/or diagnosis of SARS-CoV-2 by FDA under an Emergency Use Authorization (EUA). This EUA will remain  in effect (meaning this test can be used) for the duration of the COVID-19 declaration under Section 564(b)(1) of the Act, 21 U.S.C.section 360bbb-3(b)(1), unless the authorization is terminated  or revoked sooner.       Influenza A by PCR 01/23/2022 NEGATIVE  NEGATIVE Final   Influenza B by PCR 01/23/2022 NEGATIVE  NEGATIVE Final   Comment: (NOTE) The Xpert Xpress SARS-CoV-2/FLU/RSV plus assay is intended as an aid in the diagnosis of influenza from Nasopharyngeal swab specimens and should not be used as  a sole basis for treatment. Nasal washings and aspirates are unacceptable for Xpert Xpress SARS-CoV-2/FLU/RSV testing.  Fact Sheet for Patients: BloggerCourse.com  Fact Sheet for Healthcare Providers: SeriousBroker.it  This test is not yet approved or cleared by the [place] FDA and has been authorized for detection and/or diagnosis of SARS-CoV-2 by FDA under an Emergency Use Authorization (EUA). This EUA will remain in effect (meaning this test can be used) for the duration of the COVID-19 declaration under Section 564(b)(1) of the Act, 21 U.S.C. section 360bbb-3(b)(1), unless the authorization is terminated or revoked.  Performed at [facility], [address]  [place], [place] [address]    WBC 01/24/2022 6.1  4.0 - 10.5 K/uL Final   RBC 01/24/2022 4.60  3.87 - 5.11 MIL/uL Final   Hemoglobin 01/24/2022 11.7 (L)  12.0 - 15.0 g/dL Final   HCT 25/36/6440 37.0  36.0 - 46.0 % Final   MCV 01/24/2022 80.4  80.0 - 100.0 fL Final   MCH 01/24/2022 25.4 (L)  26.0 - 34.0 pg Final   MCHC 01/24/2022 31.6  30.0 - 36.0 g/dL Final   RDW 34/74/2595 17.6 (H)  11.5 - 15.5 % Final   Platelets 01/24/2022 334  150 - 400 K/uL Final   nRBC 01/24/2022 0.0  0.0 - 0.2 % Final   Neutrophils Relative % 01/24/2022 53  % Final   Neutro Abs 01/24/2022 3.3  1.7 - 7.7 K/uL Final   Lymphocytes Relative 01/24/2022 33  % Final   Lymphs Abs 01/24/2022 2.0  0.7 - 4.0 K/uL Final   Monocytes Relative 01/24/2022 11  % Final   Monocytes Absolute 01/24/2022 0.7  0.1 - 1.0 K/uL Final   Eosinophils Relative 01/24/2022 2  % Final   Eosinophils Absolute 01/24/2022 0.1  0.0 - 0.5 K/uL Final   Basophils Relative 01/24/2022 1  % Final   Basophils Absolute 01/24/2022 0.1  0.0 - 0.1 K/uL Final   Immature Granulocytes 01/24/2022 0  % Final   Abs Immature Granulocytes 01/24/2022 0.02  0.00 - 0.07 K/uL Final   Performed at [facility], [address], [place], [place] [address]   Glucose-Capillary 01/24/2022 110 (H)  70 - 99 mg/dL Final   Glucose reference range applies only to samples taken after fasting for at least 8 hours.  No results displayed because visit has over 200 results.    Admission on 11/22/2021, Discharged on 11/22/2021  Component Date Value Ref Range Status   Glucose-Capillary 11/22/2021 169 (H)  70 - 99 mg/dL Final   Glucose reference range applies only to samples taken after fasting for at least 8 hours.  No results displayed because visit has over 200 results.    Admission on 10/04/2021, Discharged on 10/22/2021  Component Date Value Ref Range Status   SARS Coronavirus 2 by RT PCR 10/04/2021 NEGATIVE  NEGATIVE Final   Comment: (NOTE) SARS-CoV-2 target nucleic acids are  NOT DETECTED.  The SARS-CoV-2 RNA is generally detectable in upper respiratory specimens during the acute phase of infection. The lowest concentration of SARS-CoV-2 viral copies this assay can detect is 138 copies/mL. A negative result does not preclude SARS-Cov-2 infection and should not be used as the sole basis for treatment or other patient management decisions. A negative result may occur with  improper specimen collection/handling, submission of specimen other than nasopharyngeal swab, presence of viral mutation(s) within the areas targeted by this assay, and inadequate number of viral copies(<138 copies/mL). A negative result must be combined  with clinical observations, patient history, and epidemiological information. The expected result is Negative.  Fact Sheet for Patients:  BloggerCourse.com  Fact Sheet for Healthcare Providers:  SeriousBroker.it  This test is no                          t yet approved or cleared by the [place] FDA and  has been authorized for detection and/or diagnosis of SARS-CoV-2 by FDA under an Emergency Use Authorization (EUA). This EUA will remain  in effect (meaning this test can be used) for the duration of the COVID-19 declaration under Section 564(b)(1) of the Act, 21 U.S.C.section 360bbb-3(b)(1), unless the authorization is terminated  or revoked sooner.       Influenza A by PCR 10/04/2021 NEGATIVE  NEGATIVE Final   Influenza B by PCR 10/04/2021 NEGATIVE  NEGATIVE Final   Comment: (NOTE) The Xpert Xpress SARS-CoV-2/FLU/RSV plus assay is intended as an aid in the diagnosis of influenza from Nasopharyngeal swab specimens and should not be used as a sole basis for treatment. Nasal washings and aspirates are unacceptable for Xpert Xpress SARS-CoV-2/FLU/RSV testing.  Fact Sheet for Patients: BloggerCourse.com  Fact Sheet for Healthcare  Providers: SeriousBroker.it  This test is not yet approved or cleared by the [place] FDA and has been authorized for detection and/or diagnosis of SARS-CoV-2 by FDA under an Emergency Use Authorization (EUA). This EUA will remain in effect (meaning this test can be used) for the duration of the COVID-19 declaration under Section 564(b)(1) of the Act, 21 U.S.C. section 360bbb-3(b)(1), unless the authorization is terminated or revoked.  Performed at [facility], [address], [place], [place] [address]    WBC 10/04/2021 8.4  4.0 - 10.5 K/uL Final   RBC 10/04/2021 4.42  3.87 - 5.11 MIL/uL Final   Hemoglobin 10/04/2021 11.6 (L)  12.0 - 15.0 g/dL Final   HCT 70/35/0093 35.7 (L)  36.0 - 46.0 % Final   MCV 10/04/2021 80.8  80.0 - 100.0 fL Final   MCH 10/04/2021 26.2  26.0 - 34.0 pg Final   MCHC 10/04/2021 32.5  30.0 - 36.0 g/dL Final   RDW 81/82/9937 16.0 (H)  11.5 - 15.5 % Final   Platelets 10/04/2021 372  150 - 400 K/uL Final   nRBC 10/04/2021 0.0  0.0 - 0.2 % Final   Neutrophils Relative % 10/04/2021 68  % Final   Neutro Abs 10/04/2021 5.7  1.7 - 7.7 K/uL Final   Lymphocytes Relative 10/04/2021 22  % Final   Lymphs Abs 10/04/2021 1.8  0.7 - 4.0 K/uL Final   Monocytes Relative 10/04/2021 8  % Final   Monocytes Absolute 10/04/2021 0.7  0.1 - 1.0 K/uL Final   Eosinophils Relative 10/04/2021 1  % Final   Eosinophils Absolute 10/04/2021 0.1  0.0 - 0.5 K/uL Final   Basophils Relative 10/04/2021 1  % Final   Basophils Absolute 10/04/2021 0.1  0.0 - 0.1 K/uL Final   Immature Granulocytes 10/04/2021 0  % Final   Abs Immature Granulocytes 10/04/2021 0.03  0.00 - 0.07 K/uL Final   Performed at [facility], [address] [place], [place] [address]   Sodium 10/04/2021 136  135 - 145 mmol/L Final   Potassium 10/04/2021 4.2  3.5 - 5.1 mmol/L Final   Chloride 10/04/2021 104  98 - 111 mmol/L Final   CO2 10/04/2021 25  22 - 32 mmol/L Final    Glucose, Bld 10/04/2021  100 (H)  70 - 99 mg/dL Final   Glucose reference range applies only to samples taken after fasting for at least 8 hours.   BUN 10/04/2021 9  6 - 20 mg/dL Final   Creatinine, Ser 10/04/2021 0.52  0.44 - 1.00 mg/dL Final   Calcium 16/11/9602 9.0  8.9 - 10.3 mg/dL Final   Total Protein 54/10/8117 7.0  6.5 - 8.1 g/dL Final   Albumin 14/78/2956 3.2 (L)  3.5 - 5.0 g/dL Final   AST 21/30/8657 13 (L)  15 - 41 U/L Final   ALT 10/04/2021 10  0 - 44 U/L Final   Alkaline Phosphatase 10/04/2021 61  38 - 126 U/L Final   Total Bilirubin 10/04/2021 0.3  0.3 - 1.2 mg/dL Final   GFR, Estimated 10/04/2021 >60  >60 mL/min Final   Comment: (NOTE) Calculated using the CKD-EPI Creatinine Equation (2021)    Anion gap 10/04/2021 7  5 - 15 Final   Performed at [facility], [address] [place], [place] [address]   Hgb A1c MFr Bld 10/04/2021 7.0 (H)  4.8 - 5.6 % Final   Comment: (NOTE) Pre diabetes:          5.7%-6.4%  Diabetes:              >6.4%  Glycemic control for   <7.0% adults with diabetes    Mean Plasma Glucose 10/04/2021 154.2  mg/dL Final   Performed at [facility], [address], [place], [place] [address]   Cholesterol 10/04/2021 178  0 - 200 mg/dL Final   Triglycerides 41/32/4401 155 (H)  <150 mg/dL Final   HDL 02/72/5366 52  >40 mg/dL Final   Total CHOL/HDL Ratio 10/04/2021 3.4  RATIO Final   VLDL 10/04/2021 31  0 - 40 mg/dL Final   LDL Cholesterol 10/04/2021 95  0 - 99 mg/dL Final   Comment:        Total Cholesterol/HDL:CHD Risk Coronary Heart Disease Risk Table                     Men   Women  1/2 Average Risk   3.4   3.3  Average Risk       5.0   4.4  2 X Average Risk   9.6   7.1  3 X Average Risk  23.4   11.0        Use the calculated Patient Ratio above and the CHD Risk Table to determine the patient's CHD Risk.        ATP III CLASSIFICATION (LDL):  <100     mg/dL   Optimal  440-347  mg/dL   Near or Above                     Optimal  130-159  mg/dL   Borderline  425-956  mg/dL   High  >387     mg/dL   Very High Performed at [facility], [address], [place], [place] [address]    POC Amphetamine UR 10/04/2021 None Detected  NONE DETECTED (Cut Off Level 1000 ng/mL) Final   POC Secobarbital (BAR) 10/04/2021 None Detected  NONE DETECTED (Cut Off Level 300 ng/mL) Final   POC Buprenorphine (BUP) 10/04/2021 None Detected  NONE DETECTED (Cut Off Level 10 ng/mL) Final   POC Oxazepam (BZO) 10/04/2021 None Detected  NONE DETECTED (Cut Off Level 300 ng/mL) Final   POC Cocaine UR 10/04/2021 None Detected  NONE DETECTED (Cut Off Level 300 ng/mL) Final   POC Methamphetamine UR 10/04/2021 None Detected  NONE DETECTED (Cut Off Level 1000 ng/mL) Final   POC Morphine 10/04/2021 None Detected  NONE DETECTED (Cut Off Level 300 ng/mL) Final   POC Methadone UR 10/04/2021 None Detected  NONE DETECTED (Cut Off Level 300 ng/mL) Final   POC Oxycodone UR 10/04/2021 Positive (A)  NONE DETECTED (Cut Off Level 100 ng/mL) Final   POC Marijuana UR 10/04/2021 None Detected  NONE DETECTED (Cut Off Level 50 ng/mL) Final   SARSCOV2ONAVIRUS 2 AG 10/04/2021 NEGATIVE  NEGATIVE Final   Comment: (NOTE) SARS-CoV-2 antigen NOT DETECTED.   Negative results are presumptive.  Negative results do not preclude SARS-CoV-2 infection and should not be used as the sole basis for treatment or other patient management decisions, including infection  control decisions, particularly in the presence of clinical signs and  symptoms consistent with COVID-19, or in those who have been in contact with the virus.  Negative results must be combined with clinical observations, patient history, and epidemiological information. The expected result is Negative.  Fact Sheet for Patients: https://www.jennings-kim.com/  Fact Sheet for Healthcare Providers: https://alexander-rogers.biz/  This test is not yet approved or cleared by the  [place] FDA and  has been authorized for detection and/or diagnosis of SARS-CoV-2 by FDA under an Emergency Use Authorization (EUA).  This EUA will remain in effect (meaning this test can be used) for the duration of  the COV                          ID-19 declaration under Section 564(b)(1) of the Act, 21 U.S.C. section 360bbb-3(b)(1), unless the authorization is terminated or revoked sooner.     Valproic Acid Lvl 10/04/2021 51  50.0 - 100.0 ug/mL Final   Performed at [facility], [address], [place], [place] [address]   Valproic Acid Lvl 10/08/2021 57  50.0 - 100.0 ug/mL Final   Performed at [facility], [address], [place], [place] [address]   Glucose-Capillary 10/09/2021 123 (H)  70 - 99 mg/dL Final   Glucose reference range applies only to samples taken after fasting for at least 8 hours.  There may be more visits with results that are not included.    Blood Alcohol level:  Lab Results  Component Value Date   ETH <10 11/23/2021   ETH <10 11/08/2021    Metabolic Disorder Labs: Lab Results  Component Value Date   HGBA1C 6.7 (H) 12/28/2021   MPG 145.59 12/28/2021   MPG 154.2 10/04/2021   Lab Results  Component Value Date   PROLACTIN 3.8 (L) 11/23/2021   Lab Results  Component Value Date   CHOL 171 11/23/2021   TRIG 125 11/23/2021   HDL 65 11/23/2021   CHOLHDL 2.6 11/23/2021   VLDL 25 11/23/2021   LDLCALC 81 11/23/2021   LDLCALC 95 10/04/2021    Therapeutic Lab Levels: No results found for: "LITHIUM" Lab Results  Component Value Date   VALPROATE 17 (L) 02/17/2022   VALPROATE 33 (L) 01/18/2022   No results found for: "CBMZ"  Physical Findings   PHQ2-9    Flowsheet Row ED from 11/23/2021 in [facility]  PHQ-2 Total Score 2  PHQ-9 Total Score 3      Flowsheet Row ED from 02/18/2022 in [facility] ED from 02/17/2022 in [facility] EMERGENCY DEPARTMENT  ED from 02/09/2022 in [place] No Risk No Risk No Risk        Musculoskeletal  Strength & Muscle Tone: within normal limits Gait & Station: normal Patient leans: N/A  Psychiatric Specialty Exam  Presentation  General Appearance:  Casual; Disheveled  Eye Contact: Good  Speech: Clear and Coherent; Normal Rate  Speech Volume: Normal  Handedness: Right   Mood and Affect  Mood: Euthymic  Affect: Appropriate; Congruent   Thought Process  Thought Processes: Coherent; Goal Directed; Linear  Descriptions of Associations:Intact  Orientation:Full (Time, Place and Person)  Thought Content:Logical; WDL  Diagnosis of Schizophrenia or Schizoaffective disorder in past: Yes  Duration of Psychotic Symptoms: No data recorded  Hallucinations:Hallucinations: None  Ideas of Reference:None  Suicidal Thoughts:Suicidal Thoughts: No  Homicidal Thoughts:Homicidal Thoughts: No   Sensorium  Memory: Immediate Good  Judgment: Fair  Insight: Fair   Community education officer  Concentration: Fair  Attention Span: Fair  Recall: [place] of Knowledge: Fair  Language: Fair   Psychomotor Activity  Psychomotor Activity: Psychomotor Activity: Normal   Assets  Assets: Communication Skills; Desire for Improvement; Resilience; Financial Resources/Insurance; Social Support   Sleep  Sleep: Sleep: Good   No data recorded  Physical Exam  Physical Exam Constitutional:      General: She is not in acute distress.    Appearance: She is not ill-appearing, toxic-appearing or diaphoretic.  Eyes:     General: No scleral icterus. Cardiovascular:     Rate and Rhythm: Tachycardia present.  Pulmonary:     Effort: Pulmonary effort is normal. No respiratory distress.  Neurological:     Mental Status: She is alert and oriented to person, place, and time.  Psychiatric:        Attention and Perception: Attention and  perception normal.        Mood and Affect: Mood and affect normal.        Speech: Speech normal.        Behavior: Behavior normal. Behavior is cooperative.        Thought Content: Thought content normal.        Cognition and Memory: Cognition and memory normal.    Review of Systems  Constitutional:  Negative for chills and fever.  Respiratory:  Negative for shortness of breath.   Cardiovascular:  Negative for chest pain and palpitations.  Gastrointestinal:  Negative for abdominal pain.  Neurological:  Negative for headaches.   Blood pressure 135/82, pulse (!) 102, temperature 98 F (36.7 C), temperature source Oral, resp. rate 20, SpO2 98 %. There is no height or weight on file to calculate BMI.  Treatment Plan Summary: Plan Pt remains psychiatrically cleared. Pt has been accepted to placement at [place] on 03/15/21 at [place], NP 03/05/2022 8:53 AM

## 2022-03-05 NOTE — ED Notes (Signed)
Patient resting in bed at the moment. NAD. Denies pain, SI and AVH.  Environmental check complete. Will continue to monitor for safety.

## 2022-03-05 NOTE — ED Notes (Signed)
Pt resting quietly, breathing is even and unlabored.  Pt denies SI, HI, pain and AVH.  Will continue to monitor for safety.  

## 2022-03-06 DIAGNOSIS — F209 Schizophrenia, unspecified: Secondary | ICD-10-CM | POA: Diagnosis not present

## 2022-03-06 LAB — GLUCOSE, CAPILLARY
Glucose-Capillary: 135 mg/dL — ABNORMAL HIGH (ref 70–99)
Glucose-Capillary: 135 mg/dL — ABNORMAL HIGH (ref 70–99)
Glucose-Capillary: 235 mg/dL — ABNORMAL HIGH (ref 70–99)

## 2022-03-06 NOTE — ED Provider Notes (Signed)
Behavioral Health Progress Note  Date and Time: 03/06/2022 1:57 PM Name: Teresa Murillo MRN:  [id number]  Subjective:    Teresa Murillo is a 62 y/o female with PMH of schizophrenia, borderline intellectual functioning, tobacco use d/o, HTN, HLD, DM2 (insulin-dependent), atrial flutter, COPD, admitted to [facility] on 11/23/21 voluntarily, accompanied by [employer], and reported that she was locked out of her group home. It was later confirmed that Teresa Murillo was dismissed from group home due to recurrent history of eloping from the group home. Teresa Murillo has been boarding at this facility until placement is secured.   Teresa Murillo, 62 y.o., female patient seen face to face by this provider and chart reviewed on 03/06/22.   During evaluation Teresa Murillo is observed sitting in her bed on the unit talking with another patient.  She is disheveled in appearance but makes good eye contact.  She is alert/oriented x 4, calm, and cooperative.  She is pleasant upon approach she describes her mood as "happy".  She is excited due to her upcoming placement which she reports is on 03/15/2022.  She reports that she will miss all of the staff at this facility.  States, "I got use to it here  it feels like home".  She continues to deny SI/HI/AVH.  Objectively, there is no evidence of agitation, aggression or distractibility. There is no evidence of responding to internal stimuli.     Pt remains psychiatrically cleared. Pt has been accepted to placement at [facility] on 03/15/21 at 1PM.       Diagnosis:  Final diagnoses:  Schizophrenia, unspecified type (HCC)    Total Time spent with patient: 30 minutes  Past Psychiatric History: see h&p Past Medical History:  Past Medical History:  Diagnosis Date   Borderline intellectual functioning 12/14/2021   On 12/14/2021: Appreciate assistance from psychology consult. On the Wechsler Adult Intelligence Scale-4, Ms. Kurdziel achieved a full-scale IQ score of 73 and a  percentile rank of 4 placing her in the borderline range of intellectual functioning.    Chronic obstructive pulmonary disease (COPD) (HCC)    Glaucoma    Hyperlipidemia    Hypertension    Iron deficiency    Schizoaffective disorder (HCC)    Type 2 diabetes mellitus (HCC)     Past Surgical History:  Procedure Laterality Date   TUBAL LIGATION     Family History:  Family History  Problem Relation Age of Onset   Breast cancer Maternal Grandmother    Family Psychiatric  History: see h&p Social History:  Social History   Substance and Sexual Activity  Alcohol Use Not Currently     Social History   Substance and Sexual Activity  Drug Use Not Currently    Social History   Socioeconomic History   Marital status: Divorced    Spouse name: Not on file   Number of children: Not on file   Years of education: Not on file   Highest education level: Not on file  Occupational History   Not on file  Tobacco Use   Smoking status: Former    Packs/day: 1.00    Types: Cigars, Cigarettes    Quit date: 11/09/2021    Years since quitting: 0.3   Smokeless tobacco: Current  Vaping Use   Vaping Use: Never used  Substance and Sexual Activity   Alcohol use: Not Currently   Drug use: Not Currently   Sexual activity: Not Currently    Birth control/protection: Surgical  Other Topics Concern   Not  on file  Social History Narrative   Not on file   Social Determinants of Health   Financial Resource Strain: Not on file  Food Insecurity: Not on file  Transportation Needs: Not on file  Physical Activity: Not on file  Stress: Not on file  Social Connections: Not on file   SDOH:  SDOH Screenings   Depression (PHQ2-9): Low Risk  (11/23/2021)  Tobacco Use: High Risk (02/17/2022)   Additional Social History:                         Sleep: Good  Appetite:  Good  Current Medications:  Current Facility-Administered Medications  Medication Dose Route Frequency Provider Last  Rate Last Admin   acetaminophen (TYLENOL) tablet 650 mg  650 mg Oral Q6H PRN Sindy Guadeloupe, NP   650 mg at 03/03/22 2130   albuterol (VENTOLIN HFA) 108 (90 Base) MCG/ACT inhaler 2 puff  2 puff Inhalation Q6H PRN Sindy Guadeloupe, NP   2 puff at 02/20/22 1944   alum & mag hydroxide-simeth (MAALOX/MYLANTA) 200-200-20 MG/5ML suspension 30 mL  30 mL Oral Q4H PRN Sindy Guadeloupe, NP   30 mL at 03/04/22 2147   apixaban (ELIQUIS) tablet 5 mg  5 mg Oral BID Sindy Guadeloupe, NP   5 mg at 03/06/22 1004   ARIPiprazole (ABILIFY) tablet 10 mg  10 mg Oral Daily Sindy Guadeloupe, NP   10 mg at 03/06/22 1004   cloZAPine (CLOZARIL) tablet 75 mg  75 mg Oral BID Park Pope, MD   75 mg at 03/06/22 1004   colchicine tablet 0.6 mg  0.6 mg Oral QHS Sindy Guadeloupe, NP   0.6 mg at 03/05/22 2133   diltiazem (CARDIZEM CD) 24 hr capsule 240 mg  240 mg Oral Daily Sindy Guadeloupe, NP   240 mg at 03/06/22 1004   divalproex (DEPAKOTE ER) 24 hr tablet 500 mg  500 mg Oral BID Sindy Guadeloupe, NP   500 mg at 03/06/22 1004   haloperidol (HALDOL) tablet 10 mg  10 mg Oral TID PRN Sindy Guadeloupe, NP       insulin aspart (novoLOG) injection 0-9 Units  0-9 Units Subcutaneous TID WC Park Pope, MD   2 Units at 03/06/22 1154   insulin glargine-yfgn (SEMGLEE) injection 12 Units  12 Units Subcutaneous Daily Sindy Guadeloupe, NP   12 Units at 03/06/22 1003   latanoprost (XALATAN) 0.005 % ophthalmic solution 1 drop  1 drop Both Eyes QHS Sindy Guadeloupe, NP   1 drop at 03/05/22 2138   magnesium hydroxide (MILK OF MAGNESIA) suspension 30 mL  30 mL Oral Daily PRN Sindy Guadeloupe, NP       metFORMIN (GLUCOPHAGE) tablet 500 mg  500 mg Oral BID WC Sindy Guadeloupe, NP   500 mg at 03/06/22 0759   mometasone-formoterol (DULERA) 200-5 MCG/ACT inhaler 2 puff  2 puff Inhalation BID Sindy Guadeloupe, NP   2 puff at 03/06/22 0759   multivitamins with iron tablet 1 tablet  1 tablet Oral QHS Princess Bruins, DO   1 tablet at 03/05/22 2138   nicotine (NICODERM CQ - dosed in mg/24 hr) patch  7 mg  7 mg Transdermal Daily Sindy Guadeloupe, NP   7 mg at 03/06/22 1004   rosuvastatin (CRESTOR) tablet 20 mg  20 mg Oral QHS Princess Bruins, DO   20 mg at 03/05/22 2133   valbenazine (INGREZZA) capsule 40 mg  40 mg Oral q AM Sindy Guadeloupe, NP   40 mg  at 03/06/22 0622   Vitamin D (Ergocalciferol) (DRISDOL) 1.25 MG (50000 UNIT) capsule 50,000 Units  50,000 Units Oral Q Crista Curb, NP   50,000 Units at 03/04/22 9147   Current Outpatient Medications  Medication Sig Dispense Refill   Accu-Chek Softclix Lancets lancets Use as directed up to four times daily 100 each 0   apixaban (ELIQUIS) 5 MG TABS tablet Take 1 tablet (5 mg total) by mouth 2 (two) times daily. 60 tablet 0   ARIPiprazole (ABILIFY) 10 MG tablet Take 1 tablet (10 mg total) by mouth daily. 30 tablet 0   Blood Glucose Monitoring Suppl (ACCU-CHEK GUIDE) w/Device KIT Use as directed up to four times daily 1 kit 0   budesonide-formoterol (SYMBICORT) 160-4.5 MCG/ACT inhaler Inhale 2 puffs into the lungs in the morning and at bedtime.     Cholecalciferol (VITAMIN D3) 1.25 MG (50000 UT) CAPS Take 50,000 Units by mouth every Thursday.     clozapine (CLOZARIL) 50 MG tablet Take 1 tablet (50 mg total) by mouth daily. 30 tablet 0   colchicine 0.6 MG tablet Take 1 tablet (0.6 mg total) by mouth at bedtime. 30 tablet 0   diltiazem (CARDIZEM CD) 240 MG 24 hr capsule Take 1 capsule (240 mg total) by mouth daily. (Patient not taking: Reported on 11/24/2021) 30 capsule 0   divalproex (DEPAKOTE ER) 500 MG 24 hr tablet Take 1 tablet (500 mg total) by mouth 2 (two) times daily. 60 tablet 0   glucose blood test strip Use as directed up to four times daily 50 each 0   haloperidol (HALDOL) 10 MG tablet Take 1 tablet (10 mg total) by mouth 3 (three) times daily as needed for agitation (and psychotic symptoms).     INGREZZA 40 MG capsule Take 1 capsule (40 mg total) by mouth in the morning. 30 capsule 0   insulin aspart (NOVOLOG) 100 UNIT/ML FlexPen Before  each meal 3 times a day, 140-199 - 2 units, 200-250 - 4 units, 251-299 - 6 units,  300-349 - 8 units,  350 or above 10 units. Insulin PEN if approved, provide syringes and needles if needed.Please switch to any approved short acting Insulin if needed. 15 mL 0   insulin glargine (LANTUS) 100 UNIT/ML Solostar Pen Inject 12 Units into the skin daily. 15 mL 0   Insulin Pen Needle 32G X 4 MM MISC Use 4 times a day with insulin, 1 month supply. 100 each 0   latanoprost (XALATAN) 0.005 % ophthalmic solution Place 1 drop into both eyes at bedtime.     metFORMIN (GLUCOPHAGE) 500 MG tablet Take 500 mg by mouth 2 (two) times daily with a meal.     nicotine (NICODERM CQ - DOSED IN MG/24 HR) 7 mg/24hr patch Place 1 patch (7 mg total) onto the skin daily. 28 patch 0   PROAIR HFA 108 (90 Base) MCG/ACT inhaler Inhale 2 puffs into the lungs every 6 (six) hours as needed for wheezing or shortness of breath.      Labs  Lab Results:  Admission on 02/18/2022  Component Date Value Ref Range Status   Glucose-Capillary 02/18/2022 254 (H)  70 - 99 mg/dL Final   Glucose reference range applies only to samples taken after fasting for at least 8 hours.   Glucose-Capillary 02/18/2022 112 (H)  70 - 99 mg/dL Final   Glucose reference range applies only to samples taken after fasting for at least 8 hours.   Glucose-Capillary 02/19/2022 159 (H)  70 -  99 mg/dL Final   Glucose reference range applies only to samples taken after fasting for at least 8 hours.   Glucose-Capillary 02/19/2022 160 (H)  70 - 99 mg/dL Final   Glucose reference range applies only to samples taken after fasting for at least 8 hours.   Glucose-Capillary 02/19/2022 177 (H)  70 - 99 mg/dL Final   Glucose reference range applies only to samples taken after fasting for at least 8 hours.   Glucose-Capillary 02/19/2022 147 (H)  70 - 99 mg/dL Final   Glucose reference range applies only to samples taken after fasting for at least 8 hours.   Glucose-Capillary  02/20/2022 126 (H)  70 - 99 mg/dL Final   Glucose reference range applies only to samples taken after fasting for at least 8 hours.   Glucose-Capillary 02/20/2022 132 (H)  70 - 99 mg/dL Final   Glucose reference range applies only to samples taken after fasting for at least 8 hours.   Glucose-Capillary 02/20/2022 125 (H)  70 - 99 mg/dL Final   Glucose reference range applies only to samples taken after fasting for at least 8 hours.   Glucose-Capillary 02/21/2022 151 (H)  70 - 99 mg/dL Final   Glucose reference range applies only to samples taken after fasting for at least 8 hours.   Glucose-Capillary 02/21/2022 106 (H)  70 - 99 mg/dL Final   Glucose reference range applies only to samples taken after fasting for at least 8 hours.   Glucose-Capillary 02/21/2022 149 (H)  70 - 99 mg/dL Final   Glucose reference range applies only to samples taken after fasting for at least 8 hours.   Glucose-Capillary 02/22/2022 131 (H)  70 - 99 mg/dL Final   Glucose reference range applies only to samples taken after fasting for at least 8 hours.   Glucose-Capillary 02/22/2022 141 (H)  70 - 99 mg/dL Final   Glucose reference range applies only to samples taken after fasting for at least 8 hours.   Glucose-Capillary 02/22/2022 183 (H)  70 - 99 mg/dL Final   Glucose reference range applies only to samples taken after fasting for at least 8 hours.   Glucose-Capillary 02/23/2022 133 (H)  70 - 99 mg/dL Final   Glucose reference range applies only to samples taken after fasting for at least 8 hours.   WBC 02/23/2022 4.8  4.0 - 10.5 K/uL Final   RBC 02/23/2022 3.96  3.87 - 5.11 MIL/uL Final   Hemoglobin 02/23/2022 9.7 (L)  12.0 - 15.0 g/dL Final   HCT 81/19/1478 30.6 (L)  36.0 - 46.0 % Final   MCV 02/23/2022 77.3 (L)  80.0 - 100.0 fL Final   MCH 02/23/2022 24.5 (L)  26.0 - 34.0 pg Final   MCHC 02/23/2022 31.7  30.0 - 36.0 g/dL Final   RDW 29/56/2130 16.5 (H)  11.5 - 15.5 % Final   Platelets 02/23/2022 372  150 -  400 K/uL Final   nRBC 02/23/2022 0.0  0.0 - 0.2 % Final   Neutrophils Relative % 02/23/2022 56  % Final   Neutro Abs 02/23/2022 2.7  1.7 - 7.7 K/uL Final   Lymphocytes Relative 02/23/2022 32  % Final   Lymphs Abs 02/23/2022 1.5  0.7 - 4.0 K/uL Final   Monocytes Relative 02/23/2022 9  % Final   Monocytes Absolute 02/23/2022 0.4  0.1 - 1.0 K/uL Final   Eosinophils Relative 02/23/2022 2  % Final   Eosinophils Absolute 02/23/2022 0.1  0.0 - 0.5 K/uL Final  Basophils Relative 02/23/2022 1  % Final   Basophils Absolute 02/23/2022 0.1  0.0 - 0.1 K/uL Final   Immature Granulocytes 02/23/2022 0  % Final   Abs Immature Granulocytes 02/23/2022 0.01  0.00 - 0.07 K/uL Final   Performed at [facility], [address] [place], [place] [address]   Glucose-Capillary 02/23/2022 202 (H)  70 - 99 mg/dL Final   Glucose reference range applies only to samples taken after fasting for at least 8 hours.   Glucose-Capillary 02/23/2022 143 (H)  70 - 99 mg/dL Final   Glucose reference range applies only to samples taken after fasting for at least 8 hours.   Glucose-Capillary 02/23/2022 143 (H)  70 - 99 mg/dL Final   Glucose reference range applies only to samples taken after fasting for at least 8 hours.   Glucose-Capillary 02/24/2022 207 (H)  70 - 99 mg/dL Final   Glucose reference range applies only to samples taken after fasting for at least 8 hours.   Glucose-Capillary 02/24/2022 109 (H)  70 - 99 mg/dL Final   Glucose reference range applies only to samples taken after fasting for at least 8 hours.   Glucose-Capillary 02/24/2022 127 (H)  70 - 99 mg/dL Final   Glucose reference range applies only to samples taken after fasting for at least 8 hours.   Glucose-Capillary 02/24/2022 130 (H)  70 - 99 mg/dL Final   Glucose reference range applies only to samples taken after fasting for at least 8 hours.   Glucose-Capillary 02/25/2022 117 (H)  70 - 99 mg/dL Final   Glucose reference range applies only to  samples taken after fasting for at least 8 hours.   Glucose-Capillary 02/25/2022 104 (H)  70 - 99 mg/dL Final   Glucose reference range applies only to samples taken after fasting for at least 8 hours.   Glucose-Capillary 02/25/2022 180 (H)  70 - 99 mg/dL Final   Glucose reference range applies only to samples taken after fasting for at least 8 hours.   Glucose-Capillary 02/25/2022 180 (H)  70 - 99 mg/dL Final   Glucose reference range applies only to samples taken after fasting for at least 8 hours.   Glucose-Capillary 02/26/2022 133 (H)  70 - 99 mg/dL Final   Glucose reference range applies only to samples taken after fasting for at least 8 hours.   Glucose-Capillary 02/26/2022 85  70 - 99 mg/dL Final   Glucose reference range applies only to samples taken after fasting for at least 8 hours.   Glucose-Capillary 02/26/2022 136 (H)  70 - 99 mg/dL Final   Glucose reference range applies only to samples taken after fasting for at least 8 hours.   Glucose-Capillary 02/26/2022 170 (H)  70 - 99 mg/dL Final   Glucose reference range applies only to samples taken after fasting for at least 8 hours.   Glucose-Capillary 02/26/2022 185 (H)  70 - 99 mg/dL Final   Glucose reference range applies only to samples taken after fasting for at least 8 hours.   Glucose-Capillary 02/27/2022 124 (H)  70 - 99 mg/dL Final   Glucose reference range applies only to samples taken after fasting for at least 8 hours.   Glucose-Capillary 02/27/2022 161 (H)  70 - 99 mg/dL Final   Glucose reference range applies only to samples taken after fasting for at least 8 hours.   Glucose-Capillary 02/27/2022 170 (H)  70 - 99 mg/dL Final   Glucose reference range applies only to samples taken  after fasting for at least 8 hours.   Glucose-Capillary 02/27/2022 154 (H)  70 - 99 mg/dL Final   Glucose reference range applies only to samples taken after fasting for at least 8 hours.   Glucose-Capillary 02/28/2022 109 (H)  70 - 99 mg/dL  Final   Glucose reference range applies only to samples taken after fasting for at least 8 hours.   Glucose-Capillary 02/28/2022 134 (H)  70 - 99 mg/dL Final   Glucose reference range applies only to samples taken after fasting for at least 8 hours.   Glucose-Capillary 02/28/2022 160 (H)  70 - 99 mg/dL Final   Glucose reference range applies only to samples taken after fasting for at least 8 hours.   Glucose-Capillary 02/28/2022 136 (H)  70 - 99 mg/dL Final   Glucose reference range applies only to samples taken after fasting for at least 8 hours.   Glucose-Capillary 02/28/2022 173 (H)  70 - 99 mg/dL Final   Glucose reference range applies only to samples taken after fasting for at least 8 hours.   Glucose-Capillary 03/01/2022 134 (H)  70 - 99 mg/dL Final   Glucose reference range applies only to samples taken after fasting for at least 8 hours.   Glucose-Capillary 03/01/2022 116 (H)  70 - 99 mg/dL Final   Glucose reference range applies only to samples taken after fasting for at least 8 hours.   Glucose-Capillary 03/01/2022 227 (H)  70 - 99 mg/dL Final   Glucose reference range applies only to samples taken after fasting for at least 8 hours.   Glucose-Capillary 03/01/2022 157 (H)  70 - 99 mg/dL Final   Glucose reference range applies only to samples taken after fasting for at least 8 hours.   Glucose-Capillary 03/02/2022 99  70 - 99 mg/dL Final   Glucose reference range applies only to samples taken after fasting for at least 8 hours.   WBC 03/02/2022 6.0  4.0 - 10.5 K/uL Final   RBC 03/02/2022 4.15  3.87 - 5.11 MIL/uL Final   Hemoglobin 03/02/2022 9.7 (L)  12.0 - 15.0 g/dL Final   HCT 03/02/2022 31.5 (L)  36.0 - 46.0 % Final   MCV 03/02/2022 75.9 (L)  80.0 - 100.0 fL Final   MCH 03/02/2022 23.4 (L)  26.0 - 34.0 pg Final   MCHC 03/02/2022 30.8  30.0 - 36.0 g/dL Final   RDW 03/02/2022 16.4 (H)  11.5 - 15.5 % Final   Platelets 03/02/2022 395  150 - 400 K/uL Final   nRBC 03/02/2022 0.0   0.0 - 0.2 % Final   Neutrophils Relative % 03/02/2022 61  % Final   Neutro Abs 03/02/2022 3.6  1.7 - 7.7 K/uL Final   Lymphocytes Relative 03/02/2022 29  % Final   Lymphs Abs 03/02/2022 1.7  0.7 - 4.0 K/uL Final   Monocytes Relative 03/02/2022 8  % Final   Monocytes Absolute 03/02/2022 0.5  0.1 - 1.0 K/uL Final   Eosinophils Relative 03/02/2022 1  % Final   Eosinophils Absolute 03/02/2022 0.1  0.0 - 0.5 K/uL Final   Basophils Relative 03/02/2022 1  % Final   Basophils Absolute 03/02/2022 0.1  0.0 - 0.1 K/uL Final   Immature Granulocytes 03/02/2022 0  % Final   Abs Immature Granulocytes 03/02/2022 0.01  0.00 - 0.07 K/uL Final   Performed at [facility], [place] [address] [place], [place] [address]   Glucose-Capillary 03/02/2022 171 (H)  70 - 99 mg/dL Final   Glucose  reference range applies only to samples taken after fasting for at least 8 hours.   Glucose-Capillary 03/02/2022 108 (H)  70 - 99 mg/dL Final   Glucose reference range applies only to samples taken after fasting for at least 8 hours.   Glucose-Capillary 03/02/2022 203 (H)  70 - 99 mg/dL Final   Glucose reference range applies only to samples taken after fasting for at least 8 hours.   Glucose-Capillary 03/03/2022 128 (H)  70 - 99 mg/dL Final   Glucose reference range applies only to samples taken after fasting for at least 8 hours.   Glucose-Capillary 03/03/2022 134 (H)  70 - 99 mg/dL Final   Glucose reference range applies only to samples taken after fasting for at least 8 hours.   Glucose-Capillary 03/03/2022 100 (H)  70 - 99 mg/dL Final   Glucose reference range applies only to samples taken after fasting for at least 8 hours.   Glucose-Capillary 03/03/2022 160 (H)  70 - 99 mg/dL Final   Glucose reference range applies only to samples taken after fasting for at least 8 hours.   Glucose-Capillary 03/04/2022 116 (H)  70 - 99 mg/dL Final   Glucose reference range applies only to samples taken after fasting for at least 8  hours.   Glucose-Capillary 03/04/2022 104 (H)  70 - 99 mg/dL Final   Glucose reference range applies only to samples taken after fasting for at least 8 hours.   Glucose-Capillary 03/04/2022 156 (H)  70 - 99 mg/dL Final   Glucose reference range applies only to samples taken after fasting for at least 8 hours.   Glucose-Capillary 03/04/2022 201 (H)  70 - 99 mg/dL Final   Glucose reference range applies only to samples taken after fasting for at least 8 hours.   Glucose-Capillary 03/05/2022 120 (H)  70 - 99 mg/dL Final   Glucose reference range applies only to samples taken after fasting for at least 8 hours.   Glucose-Capillary 03/05/2022 237 (H)  70 - 99 mg/dL Final   Glucose reference range applies only to samples taken after fasting for at least 8 hours.   Glucose-Capillary 03/05/2022 168 (H)  70 - 99 mg/dL Final   Glucose reference range applies only to samples taken after fasting for at least 8 hours.   Glucose-Capillary 03/05/2022 200 (H)  70 - 99 mg/dL Final   Glucose reference range applies only to samples taken after fasting for at least 8 hours.   Glucose-Capillary 03/06/2022 135 (H)  70 - 99 mg/dL Final   Glucose reference range applies only to samples taken after fasting for at least 8 hours.   Glucose-Capillary 03/06/2022 135 (H)  70 - 99 mg/dL Final   Glucose reference range applies only to samples taken after fasting for at least 8 hours.  Admission on 02/17/2022, Discharged on 02/18/2022  Component Date Value Ref Range Status   Sodium 02/17/2022 136  135 - 145 mmol/L Final   Potassium 02/17/2022 3.9  3.5 - 5.1 mmol/L Final   Chloride 02/17/2022 101  98 - 111 mmol/L Final   CO2 02/17/2022 25  22 - 32 mmol/L Final   Glucose, Bld 02/17/2022 113 (H)  70 - 99 mg/dL Final   Glucose reference range applies only to samples taken after fasting for at least 8 hours.   BUN 02/17/2022 9  6 - 20 mg/dL Final   Creatinine, Ser 02/17/2022 0.50  0.44 - 1.00 mg/dL Final   Calcium 16/11/9602  8.9  8.9 - 10.3  mg/dL Final   GFR, Estimated 02/17/2022 >60  >60 mL/min Final   Comment: (NOTE) Calculated using the CKD-EPI Creatinine Equation (2021)    Anion gap 02/17/2022 10  5 - 15 Final   Performed at [facility], [address] [place], [place] [address]   WBC 02/17/2022 6.5  4.0 - 10.5 K/uL Final   RBC 02/17/2022 4.24  3.87 - 5.11 MIL/uL Final   Hemoglobin 02/17/2022 10.2 (L)  12.0 - 15.0 g/dL Final   HCT 87/56/4332 33.2 (L)  36.0 - 46.0 % Final   MCV 02/17/2022 78.3 (L)  80.0 - 100.0 fL Final   MCH 02/17/2022 24.1 (L)  26.0 - 34.0 pg Final   MCHC 02/17/2022 30.7  30.0 - 36.0 g/dL Final   RDW 95/18/8416 17.2 (H)  11.5 - 15.5 % Final   Platelets 02/17/2022 390  150 - 400 K/uL Final   nRBC 02/17/2022 0.0  0.0 - 0.2 % Final   Performed at [facility], [address] [place], [place] [address]   Troponin I (High Sensitivity) 02/17/2022 4  <18 ng/L Final   Comment: (NOTE) Elevated high sensitivity troponin I (hsTnI) values and significant  changes across serial measurements may suggest ACS but many other  chronic and acute conditions are known to elevate hsTnI results.  Refer to the "Links" section for chest pain algorithms and additional  guidance. Performed at [facility], [address] [place], [place] [address]    Valproic Acid Lvl 02/17/2022 17 (L)  50.0 - 100.0 ug/mL Final   Performed at [facility], [address] [place], [place] [address]   B Natriuretic Peptide 02/17/2022 30.9  0.0 - 100.0 pg/mL Final   Performed at [facility], [address], [place], [place] [address]   Troponin I (High Sensitivity) 02/18/2022 4  <18 ng/L Final   Comment: (NOTE) Elevated high sensitivity troponin I (hsTnI) values and significant  changes across serial measurements may suggest ACS but many other  chronic and acute conditions are known to elevate hsTnI results.  Refer to the "Links" section for chest pain algorithms and additional   guidance. Performed at [facility], [address] [place], [place] [address]    D-Dimer, Quant 02/18/2022 0.29  0.00 - 0.50 ug/mL-FEU Final   Comment: (NOTE) At the manufacturer cut-off value of 0.5 g/mL FEU, this assay has a negative predictive value of 95-100%.This assay is intended for use in conjunction with a clinical pretest probability (PTP) assessment model to exclude pulmonary embolism (PE) and deep venous thrombosis (DVT) in outpatients suspected of PE or DVT. Results should be correlated with clinical presentation. Performed at [facility], [address] [place], [place] [address]   Admission on 02/09/2022, Discharged on 02/17/2022  Component Date Value Ref Range Status   Glucose-Capillary 02/09/2022 118 (H)  70 - 99 mg/dL Final   Glucose reference range applies only to samples taken after fasting for at least 8 hours.   Glucose-Capillary 02/10/2022 148 (H)  70 - 99 mg/dL Final   Glucose reference range applies only to samples taken after fasting for at least 8 hours.   Glucose-Capillary 02/10/2022 174 (H)  70 - 99 mg/dL Final   Glucose reference range applies only to samples taken after fasting for at least 8 hours.   Glucose-Capillary 02/10/2022 128 (H)  70 - 99 mg/dL Final   Glucose reference range applies only to samples taken after fasting  for at least 8 hours.   Glucose-Capillary 02/10/2022 182 (H)  70 - 99 mg/dL Final   Glucose reference range applies only to samples taken after fasting for at least 8 hours.   Glucose-Capillary 02/11/2022 158 (H)  70 - 99 mg/dL Final   Glucose reference range applies only to samples taken after fasting for at least 8 hours.   Glucose-Capillary 02/11/2022 159 (H)  70 - 99 mg/dL Final   Glucose reference range applies only to samples taken after fasting for at least 8 hours.   Glucose-Capillary 02/11/2022 153 (H)  70 - 99 mg/dL Final   Glucose reference range applies only to samples taken after fasting for at least  8 hours.   Glucose-Capillary 02/11/2022 159 (H)  70 - 99 mg/dL Final   Glucose reference range applies only to samples taken after fasting for at least 8 hours.   Glucose-Capillary 02/11/2022 153 (H)  70 - 99 mg/dL Final   Glucose reference range applies only to samples taken after fasting for at least 8 hours.   Glucose-Capillary 02/11/2022 109 (H)  70 - 99 mg/dL Final   Glucose reference range applies only to samples taken after fasting for at least 8 hours.   Glucose-Capillary 02/11/2022 213 (H)  70 - 99 mg/dL Final   Glucose reference range applies only to samples taken after fasting for at least 8 hours.   Glucose-Capillary 02/11/2022 229 (H)  70 - 99 mg/dL Final   Glucose reference range applies only to samples taken after fasting for at least 8 hours.   Glucose-Capillary 02/12/2022 128 (H)  70 - 99 mg/dL Final   Glucose reference range applies only to samples taken after fasting for at least 8 hours.   Glucose-Capillary 02/12/2022 149 (H)  70 - 99 mg/dL Final   Glucose reference range applies only to samples taken after fasting for at least 8 hours.   Color, Urine 02/12/2022 YELLOW  YELLOW Final   APPearance 02/12/2022 CLEAR  CLEAR Final   Specific Gravity, Urine 02/12/2022 1.015  1.005 - 1.030 Final   pH 02/12/2022 5.0  5.0 - 8.0 Final   Glucose, UA 02/12/2022 NEGATIVE  NEGATIVE mg/dL Final   Hgb urine dipstick 02/12/2022 NEGATIVE  NEGATIVE Final   Bilirubin Urine 02/12/2022 NEGATIVE  NEGATIVE Final   Ketones, ur 02/12/2022 NEGATIVE  NEGATIVE mg/dL Final   Protein, ur 16/11/9602 NEGATIVE  NEGATIVE mg/dL Final   Nitrite 54/10/8117 NEGATIVE  NEGATIVE Final   Leukocytes,Ua 02/12/2022 NEGATIVE  NEGATIVE Final   Performed at [facility], [address] [place], [place] [address]   Specimen Description 02/12/2022 URINE, CLEAN CATCH   Final   Special Requests 02/12/2022    Final                   Value:NONE Performed at [facility], [address], [place], [place]  [address]    Culture 02/12/2022 10,000 COLONIES/mL PROTEUS MIRABILIS (A)   Final   Report Status 02/12/2022 02/15/2022 FINAL   Final   Organism ID, Bacteria 02/12/2022 PROTEUS MIRABILIS (A)   Final   Glucose-Capillary 02/12/2022 128 (H)  70 - 99 mg/dL Final   Glucose reference range applies only to samples taken after fasting for at least 8 hours.   Glucose-Capillary 02/12/2022 196 (H)  70 - 99 mg/dL Final   Glucose reference range applies only to samples taken after fasting for at least 8 hours.   Glucose-Capillary 02/13/2022 168 (H)  70 - 99  mg/dL Final   Glucose reference range applies only to samples taken after fasting for at least 8 hours.   Glucose-Capillary 02/13/2022 153 (H)  70 - 99 mg/dL Final   Glucose reference range applies only to samples taken after fasting for at least 8 hours.   Glucose-Capillary 02/13/2022 134 (H)  70 - 99 mg/dL Final   Glucose reference range applies only to samples taken after fasting for at least 8 hours.   Glucose-Capillary 02/13/2022 178 (H)  70 - 99 mg/dL Final   Glucose reference range applies only to samples taken after fasting for at least 8 hours.   Glucose-Capillary 02/14/2022 156 (H)  70 - 99 mg/dL Final   Glucose reference range applies only to samples taken after fasting for at least 8 hours.   Glucose-Capillary 02/14/2022 169 (H)  70 - 99 mg/dL Final   Glucose reference range applies only to samples taken after fasting for at least 8 hours.   Glucose-Capillary 02/14/2022 156 (H)  70 - 99 mg/dL Final   Glucose reference range applies only to samples taken after fasting for at least 8 hours.   Glucose-Capillary 02/14/2022 148 (H)  70 - 99 mg/dL Final   Glucose reference range applies only to samples taken after fasting for at least 8 hours.   Glucose-Capillary 02/15/2022 159 (H)  70 - 99 mg/dL Final   Glucose reference range applies only to samples taken after fasting for at least 8 hours.   Glucose-Capillary 02/15/2022 125 (H)  70 - 99 mg/dL  Final   Glucose reference range applies only to samples taken after fasting for at least 8 hours.   Glucose-Capillary 02/15/2022 142 (H)  70 - 99 mg/dL Final   Glucose reference range applies only to samples taken after fasting for at least 8 hours.   Glucose-Capillary 02/15/2022 128 (H)  70 - 99 mg/dL Final   Glucose reference range applies only to samples taken after fasting for at least 8 hours.   Glucose-Capillary 02/16/2022 137 (H)  70 - 99 mg/dL Final   Glucose reference range applies only to samples taken after fasting for at least 8 hours.   WBC 02/16/2022 6.0  4.0 - 10.5 K/uL Final   RBC 02/16/2022 4.48  3.87 - 5.11 MIL/uL Final   Hemoglobin 02/16/2022 10.7 (L)  12.0 - 15.0 g/dL Final   HCT 16/11/9602 35.4 (L)  36.0 - 46.0 % Final   MCV 02/16/2022 79.0 (L)  80.0 - 100.0 fL Final   MCH 02/16/2022 23.9 (L)  26.0 - 34.0 pg Final   MCHC 02/16/2022 30.2  30.0 - 36.0 g/dL Final   RDW 54/10/8117 17.0 (H)  11.5 - 15.5 % Final   Platelets 02/16/2022 387  150 - 400 K/uL Final   nRBC 02/16/2022 0.0  0.0 - 0.2 % Final   Neutrophils Relative % 02/16/2022 54  % Final   Neutro Abs 02/16/2022 3.2  1.7 - 7.7 K/uL Final   Lymphocytes Relative 02/16/2022 35  % Final   Lymphs Abs 02/16/2022 2.1  0.7 - 4.0 K/uL Final   Monocytes Relative 02/16/2022 6  % Final   Monocytes Absolute 02/16/2022 0.4  0.1 - 1.0 K/uL Final   Eosinophils Relative 02/16/2022 2  % Final   Eosinophils Absolute 02/16/2022 0.1  0.0 - 0.5 K/uL Final   Basophils Relative 02/16/2022 2  % Final   Basophils Absolute 02/16/2022 0.1  0.0 - 0.1 K/uL Final   Immature Granulocytes 02/16/2022 1  % Final  Abs Immature Granulocytes 02/16/2022 0.04  0.00 - 0.07 K/uL Final   Performed at [facility], [address], [place], [place] [address]   Glucose-Capillary 02/16/2022 131 (H)  70 - 99 mg/dL Final   Glucose reference range applies only to samples taken after fasting for at least 8 hours.   Glucose-Capillary 02/16/2022 123 (H)   70 - 99 mg/dL Final   Glucose reference range applies only to samples taken after fasting for at least 8 hours.   Glucose-Capillary 02/16/2022 195 (H)  70 - 99 mg/dL Final   Glucose reference range applies only to samples taken after fasting for at least 8 hours.   Glucose-Capillary 02/17/2022 148 (H)  70 - 99 mg/dL Final   Glucose reference range applies only to samples taken after fasting for at least 8 hours.  Admission on 02/08/2022, Discharged on 02/09/2022  Component Date Value Ref Range Status   B Natriuretic Peptide 02/08/2022 40.2  0.0 - 100.0 pg/mL Final   Performed at [facility], [address] [place], [place] [address]   Troponin I (High Sensitivity) 02/08/2022 4  <18 ng/L Final   Comment: (NOTE) Elevated high sensitivity troponin I (hsTnI) values and significant  changes across serial measurements may suggest ACS but many other  chronic and acute conditions are known to elevate hsTnI results.  Refer to the "Links" section for chest pain algorithms and additional  guidance. Performed at [facility], [address] [place], [place] [address]    WBC 02/08/2022 8.5  4.0 - 10.5 K/uL Final   RBC 02/08/2022 4.30  3.87 - 5.11 MIL/uL Final   Hemoglobin 02/08/2022 10.7 (L)  12.0 - 15.0 g/dL Final   HCT 84/16/6063 33.1 (L)  36.0 - 46.0 % Final   MCV 02/08/2022 77.0 (L)  80.0 - 100.0 fL Final   MCH 02/08/2022 24.9 (L)  26.0 - 34.0 pg Final   MCHC 02/08/2022 32.3  30.0 - 36.0 g/dL Final   RDW 01/60/1093 16.5 (H)  11.5 - 15.5 % Final   Platelets 02/08/2022 361  150 - 400 K/uL Final   nRBC 02/08/2022 0.0  0.0 - 0.2 % Final   Performed at [facility], [address] [place], [place] [address]   Sodium 02/08/2022 131 (L)  135 - 145 mmol/L Final   Potassium 02/08/2022 4.4  3.5 - 5.1 mmol/L Final   Chloride 02/08/2022 96 (L)  98 - 111 mmol/L Final   CO2 02/08/2022 25  22 - 32 mmol/L Final   Glucose, Bld 02/08/2022 126 (H)  70 - 99 mg/dL Final   Glucose  reference range applies only to samples taken after fasting for at least 8 hours.   BUN 02/08/2022 11  6 - 20 mg/dL Final   Creatinine, Ser 02/08/2022 0.52  0.44 - 1.00 mg/dL Final   Calcium 32/20/2542 9.4  8.9 - 10.3 mg/dL Final   Total Protein 70/62/3762 7.5  6.5 - 8.1 g/dL Final   Albumin 83/15/1761 3.6  3.5 - 5.0 g/dL Final   AST 60/73/7106 22  15 - 41 U/L Final   ALT 02/08/2022 20  0 - 44 U/L Final   Alkaline Phosphatase 02/08/2022 67  38 - 126 U/L Final   Total Bilirubin 02/08/2022 0.1 (L)  0.3 - 1.2 mg/dL Final   GFR, Estimated 02/08/2022 >60  >60 mL/min Final   Comment: (NOTE) Calculated using the CKD-EPI Creatinine Equation (2021)    Anion gap 02/08/2022 10  5 - 15 Final   Performed at [facility], [address] [place], [place] [address]   Troponin I (High Sensitivity) 02/08/2022 5  <18 ng/L Final   Comment: (NOTE) Elevated high sensitivity troponin I (hsTnI) values and significant  changes across serial measurements may suggest ACS but many other  chronic and acute conditions are known to elevate hsTnI results.  Refer to the "Links" section for chest pain algorithms and additional  guidance. Performed at [facility], [address] [place], [place] [address]    WBC 02/09/2022 7.3  4.0 - 10.5 K/uL Final   RBC 02/09/2022 4.70  3.87 - 5.11 MIL/uL Final   Hemoglobin 02/09/2022 11.4 (L)  12.0 - 15.0 g/dL Final   HCT 09/81/191412/01/2022 36.6  36.0 - 46.0 % Final   MCV 02/09/2022 77.9 (L)  80.0 - 100.0 fL Final   MCH 02/09/2022 24.3 (L)  26.0 - 34.0 pg Final   MCHC 02/09/2022 31.1  30.0 - 36.0 g/dL Final   RDW 78/29/562112/01/2022 16.6 (H)  11.5 - 15.5 % Final   Platelets 02/09/2022 403 (H)  150 - 400 K/uL Final   nRBC 02/09/2022 0.0  0.0 - 0.2 % Final   Performed at [facility], [address] [place], [place] [address]   Glucose-Capillary 02/08/2022 117 (H)  70 - 99 mg/dL Final   Glucose reference range applies only to samples taken after fasting for at least 8  hours.   Sodium 02/09/2022 138  135 - 145 mmol/L Final   Potassium 02/09/2022 3.7  3.5 - 5.1 mmol/L Final   Chloride 02/09/2022 103  98 - 111 mmol/L Final   CO2 02/09/2022 24  22 - 32 mmol/L Final   Glucose, Bld 02/09/2022 134 (H)  70 - 99 mg/dL Final   Glucose reference range applies only to samples taken after fasting for at least 8 hours.   BUN 02/09/2022 9  6 - 20 mg/dL Final   Creatinine, Ser 02/09/2022 0.44  0.44 - 1.00 mg/dL Final   Calcium 78/46/962912/01/2022 8.3 (L)  8.9 - 10.3 mg/dL Final   GFR, Estimated 02/09/2022 >60  >60 mL/min Final   Comment: (NOTE) Calculated using the CKD-EPI Creatinine Equation (2021)    Anion gap 02/09/2022 11  5 - 15 Final   Performed at [facility], [address] [place], [place] [address]   Iron 02/09/2022 20 (L)  28 - 170 ug/dL Final   TIBC 32/44/010212/01/2022 455 (H)  250 - 450 ug/dL Final   Saturation Ratios 02/09/2022 4 (L)  10.4 - 31.8 % Final   UIBC 02/09/2022 435  ug/dL Final   Performed at [facility], [address] [place], [place] [address]   Ferritin 02/09/2022 2 (L)  11 - 307 ng/mL Final   Performed at [facility], [address] [place], [place] [address]   Weight 02/09/2022 2,846.58  oz Final   Height 02/09/2022 62  in Final   BP 02/09/2022 143/80  mmHg Final   WBC 02/09/2022 8.1  4.0 - 10.5 K/uL Final   RBC 02/09/2022 4.38  3.87 - 5.11 MIL/uL Final   Hemoglobin 02/09/2022 10.9 (L)  12.0 - 15.0 g/dL Final   HCT 47/42/595612/01/2022 33.3 (L)  36.0 - 46.0 % Final   MCV 02/09/2022 76.0 (L)  80.0 - 100.0 fL Final   MCH 02/09/2022 24.9 (L)  26.0 - 34.0 pg Final   MCHC 02/09/2022 32.7  30.0 -  36.0 g/dL Final   RDW 16/11/9602 16.5 (H)  11.5 - 15.5 % Final   Platelets 02/09/2022 397  150 - 400 K/uL Final   nRBC 02/09/2022 0.0  0.0 - 0.2 % Final   Neutrophils Relative % 02/09/2022 66  % Final   Neutro Abs 02/09/2022 5.4  1.7 - 7.7 K/uL Final   Lymphocytes Relative 02/09/2022 24  % Final   Lymphs Abs 02/09/2022 1.9  0.7 - 4.0 K/uL Final    Monocytes Relative 02/09/2022 8  % Final   Monocytes Absolute 02/09/2022 0.6  0.1 - 1.0 K/uL Final   Eosinophils Relative 02/09/2022 1  % Final   Eosinophils Absolute 02/09/2022 0.1  0.0 - 0.5 K/uL Final   Basophils Relative 02/09/2022 1  % Final   Basophils Absolute 02/09/2022 0.1  0.0 - 0.1 K/uL Final   Immature Granulocytes 02/09/2022 0  % Final   Abs Immature Granulocytes 02/09/2022 0.03  0.00 - 0.07 K/uL Final   Performed at [facility], [address], [place], [place] [address]   Glucose-Capillary 02/09/2022 238 (H)  70 - 99 mg/dL Final   Glucose reference range applies only to samples taken after fasting for at least 8 hours.   Glucose-Capillary 02/09/2022 91  70 - 99 mg/dL Final   Glucose reference range applies only to samples taken after fasting for at least 8 hours.   CRP 02/09/2022 <0.5  <1.0 mg/dL Final   Performed at [facility], [address], [place], [place] [address]   Sed Rate 02/09/2022 15  0 - 22 mm/hr Final   Performed at [facility], [address], [place], [place] [address]   Glucose-Capillary 02/09/2022 137 (H)  70 - 99 mg/dL Final   Glucose reference range applies only to samples taken after fasting for at least 8 hours.  Admission on 01/24/2022, Discharged on 02/08/2022  Component Date Value Ref Range Status   Glucose-Capillary 01/24/2022 143 (H)  70 - 99 mg/dL Final   Glucose reference range applies only to samples taken after fasting for at least 8 hours.   Glucose-Capillary 01/24/2022 120 (H)  70 - 99 mg/dL Final   Glucose reference range applies only to samples taken after fasting for at least 8 hours.   Glucose-Capillary 01/25/2022 122 (H)  70 - 99 mg/dL Final   Glucose reference range applies only to samples taken after fasting for at least 8 hours.   Glucose-Capillary 01/25/2022 190 (H)  70 - 99 mg/dL Final   Glucose reference range applies only to samples taken after fasting for at least 8 hours.   Glucose-Capillary 01/25/2022  163 (H)  70 - 99 mg/dL Final   Glucose reference range applies only to samples taken after fasting for at least 8 hours.   WBC 01/26/2022 6.5  4.0 - 10.5 K/uL Final   RBC 01/26/2022 4.53  3.87 - 5.11 MIL/uL Final   Hemoglobin 01/26/2022 11.3 (L)  12.0 - 15.0 g/dL Final   HCT 62/13/0865 36.4  36.0 - 46.0 % Final   MCV 01/26/2022 80.4  80.0 - 100.0 fL Final   MCH 01/26/2022 24.9 (L)  26.0 - 34.0 pg Final   MCHC 01/26/2022 31.0  30.0 - 36.0 g/dL Final   RDW 78/46/9629 17.3 (H)  11.5 - 15.5 % Final   Platelets 01/26/2022 327  150 - 400 K/uL Final   nRBC 01/26/2022 0.0  0.0 - 0.2 % Final   Neutrophils Relative % 01/26/2022 60  %  Final   Neutro Abs 01/26/2022 3.9  1.7 - 7.7 K/uL Final   Lymphocytes Relative 01/26/2022 31  % Final   Lymphs Abs 01/26/2022 2.0  0.7 - 4.0 K/uL Final   Monocytes Relative 01/26/2022 7  % Final   Monocytes Absolute 01/26/2022 0.5  0.1 - 1.0 K/uL Final   Eosinophils Relative 01/26/2022 1  % Final   Eosinophils Absolute 01/26/2022 0.1  0.0 - 0.5 K/uL Final   Basophils Relative 01/26/2022 1  % Final   Basophils Absolute 01/26/2022 0.1  0.0 - 0.1 K/uL Final   Immature Granulocytes 01/26/2022 0  % Final   Abs Immature Granulocytes 01/26/2022 0.02  0.00 - 0.07 K/uL Final   Performed at [facility], [address] [place], [place] [address]   Glucose-Capillary 01/26/2022 149 (H)  70 - 99 mg/dL Final   Glucose reference range applies only to samples taken after fasting for at least 8 hours.   Glucose-Capillary 01/26/2022 130 (H)  70 - 99 mg/dL Final   Glucose reference range applies only to samples taken after fasting for at least 8 hours.   Glucose-Capillary 01/26/2022 151 (H)  70 - 99 mg/dL Final   Glucose reference range applies only to samples taken after fasting for at least 8 hours.   Glucose-Capillary 01/26/2022 170 (H)  70 - 99 mg/dL Final   Glucose reference range applies only to samples taken after fasting for at least 8 hours.   Glucose-Capillary  01/27/2022 129 (H)  70 - 99 mg/dL Final   Glucose reference range applies only to samples taken after fasting for at least 8 hours.   Glucose-Capillary 01/27/2022 101 (H)  70 - 99 mg/dL Final   Glucose reference range applies only to samples taken after fasting for at least 8 hours.   Glucose-Capillary 01/27/2022 220 (H)  70 - 99 mg/dL Final   Glucose reference range applies only to samples taken after fasting for at least 8 hours.   Glucose-Capillary 01/27/2022 154 (H)  70 - 99 mg/dL Final   Glucose reference range applies only to samples taken after fasting for at least 8 hours.   Glucose-Capillary 01/27/2022 163 (H)  70 - 99 mg/dL Final   Glucose reference range applies only to samples taken after fasting for at least 8 hours.   Glucose-Capillary 01/28/2022 127 (H)  70 - 99 mg/dL Final   Glucose reference range applies only to samples taken after fasting for at least 8 hours.   Glucose-Capillary 01/28/2022 110 (H)  70 - 99 mg/dL Final   Glucose reference range applies only to samples taken after fasting for at least 8 hours.   Glucose-Capillary 01/28/2022 167 (H)  70 - 99 mg/dL Final   Glucose reference range applies only to samples taken after fasting for at least 8 hours.   Glucose-Capillary 01/29/2022 145 (H)  70 - 99 mg/dL Final   Glucose reference range applies only to samples taken after fasting for at least 8 hours.   Glucose-Capillary 01/29/2022 203 (H)  70 - 99 mg/dL Final   Glucose reference range applies only to samples taken after fasting for at least 8 hours.   Glucose-Capillary 01/29/2022 153 (H)  70 - 99 mg/dL Final   Glucose reference range applies only to samples taken after fasting for at least 8 hours.   Glucose-Capillary 01/29/2022 166 (H)  70 - 99 mg/dL Final   Glucose reference range applies only to samples taken after fasting for at least 8 hours.  Glucose-Capillary 01/30/2022 142 (H)  70 - 99 mg/dL Final   Glucose reference range applies only to samples taken  after fasting for at least 8 hours.   Glucose-Capillary 01/30/2022 138 (H)  70 - 99 mg/dL Final   Glucose reference range applies only to samples taken after fasting for at least 8 hours.   Glucose-Capillary 01/30/2022 185 (H)  70 - 99 mg/dL Final   Glucose reference range applies only to samples taken after fasting for at least 8 hours.   Glucose-Capillary 01/30/2022 220 (H)  70 - 99 mg/dL Final   Glucose reference range applies only to samples taken after fasting for at least 8 hours.   Glucose-Capillary 01/31/2022 147 (H)  70 - 99 mg/dL Final   Glucose reference range applies only to samples taken after fasting for at least 8 hours.   Glucose-Capillary 01/31/2022 131 (H)  70 - 99 mg/dL Final   Glucose reference range applies only to samples taken after fasting for at least 8 hours.   Glucose-Capillary 01/31/2022 169 (H)  70 - 99 mg/dL Final   Glucose reference range applies only to samples taken after fasting for at least 8 hours.   Glucose-Capillary 01/31/2022 144 (H)  70 - 99 mg/dL Final   Glucose reference range applies only to samples taken after fasting for at least 8 hours.   Comment 1 01/31/2022 RN AWARE   Final   Glucose-Capillary 02/01/2022 117 (H)  70 - 99 mg/dL Final   Glucose reference range applies only to samples taken after fasting for at least 8 hours.   Glucose-Capillary 02/01/2022 180 (H)  70 - 99 mg/dL Final   Glucose reference range applies only to samples taken after fasting for at least 8 hours.   Glucose-Capillary 02/01/2022 209 (H)  70 - 99 mg/dL Final   Glucose reference range applies only to samples taken after fasting for at least 8 hours.   Glucose-Capillary 02/02/2022 131 (H)  70 - 99 mg/dL Final   Glucose reference range applies only to samples taken after fasting for at least 8 hours.   WBC 02/02/2022 7.5  4.0 - 10.5 K/uL Final   RBC 02/02/2022 4.52  3.87 - 5.11 MIL/uL Final   Hemoglobin 02/02/2022 11.3 (L)  12.0 - 15.0 g/dL Final   HCT 35/45/6256 35.0 (L)   36.0 - 46.0 % Final   MCV 02/02/2022 77.4 (L)  80.0 - 100.0 fL Final   MCH 02/02/2022 25.0 (L)  26.0 - 34.0 pg Final   MCHC 02/02/2022 32.3  30.0 - 36.0 g/dL Final   RDW 38/93/7342 16.7 (H)  11.5 - 15.5 % Final   Platelets 02/02/2022 345  150 - 400 K/uL Final   nRBC 02/02/2022 0.0  0.0 - 0.2 % Final   Neutrophils Relative % 02/02/2022 63  % Final   Neutro Abs 02/02/2022 4.7  1.7 - 7.7 K/uL Final   Lymphocytes Relative 02/02/2022 28  % Final   Lymphs Abs 02/02/2022 2.1  0.7 - 4.0 K/uL Final   Monocytes Relative 02/02/2022 7  % Final   Monocytes Absolute 02/02/2022 0.5  0.1 - 1.0 K/uL Final   Eosinophils Relative 02/02/2022 1  % Final   Eosinophils Absolute 02/02/2022 0.1  0.0 - 0.5 K/uL Final   Basophils Relative 02/02/2022 1  % Final   Basophils Absolute 02/02/2022 0.1  0.0 - 0.1 K/uL Final   Immature Granulocytes 02/02/2022 0  % Final   Abs Immature Granulocytes 02/02/2022 0.03  0.00 - 0.07 K/uL  Final   Performed at [facility], [address] [place], [place] [address]   Glucose-Capillary 02/02/2022 95  70 - 99 mg/dL Final   Glucose reference range applies only to samples taken after fasting for at least 8 hours.   Glucose-Capillary 02/02/2022 120 (H)  70 - 99 mg/dL Final   Glucose reference range applies only to samples taken after fasting for at least 8 hours.   Glucose-Capillary 02/02/2022 191 (H)  70 - 99 mg/dL Final   Glucose reference range applies only to samples taken after fasting for at least 8 hours.   Glucose-Capillary 02/03/2022 148 (H)  70 - 99 mg/dL Final   Glucose reference range applies only to samples taken after fasting for at least 8 hours.   Glucose-Capillary 02/03/2022 122 (H)  70 - 99 mg/dL Final   Glucose reference range applies only to samples taken after fasting for at least 8 hours.   Glucose-Capillary 02/03/2022 233 (H)  70 - 99 mg/dL Final   Glucose reference range applies only to samples taken after fasting for at least 8 hours.    Glucose-Capillary 02/04/2022 126 (H)  70 - 99 mg/dL Final   Glucose reference range applies only to samples taken after fasting for at least 8 hours.   Glucose-Capillary 02/04/2022 117 (H)  70 - 99 mg/dL Final   Glucose reference range applies only to samples taken after fasting for at least 8 hours.   Glucose-Capillary 02/04/2022 135 (H)  70 - 99 mg/dL Final   Glucose reference range applies only to samples taken after fasting for at least 8 hours.   Glucose-Capillary 02/04/2022 197 (H)  70 - 99 mg/dL Final   Glucose reference range applies only to samples taken after fasting for at least 8 hours.   Glucose-Capillary 02/05/2022 170 (H)  70 - 99 mg/dL Final   Glucose reference range applies only to samples taken after fasting for at least 8 hours.   Glucose-Capillary 02/05/2022 107 (H)  70 - 99 mg/dL Final   Glucose reference range applies only to samples taken after fasting for at least 8 hours.   Glucose-Capillary 02/05/2022 184 (H)  70 - 99 mg/dL Final   Glucose reference range applies only to samples taken after fasting for at least 8 hours.   Glucose-Capillary 02/05/2022 256 (H)  70 - 99 mg/dL Final   Glucose reference range applies only to samples taken after fasting for at least 8 hours.   Glucose-Capillary 02/06/2022 144 (H)  70 - 99 mg/dL Final   Glucose reference range applies only to samples taken after fasting for at least 8 hours.   Glucose-Capillary 02/06/2022 190 (H)  70 - 99 mg/dL Final   Glucose reference range applies only to samples taken after fasting for at least 8 hours.   Glucose-Capillary 02/06/2022 92  70 - 99 mg/dL Final   Glucose reference range applies only to samples taken after fasting for at least 8 hours.   Trichomonas 02/06/2022 Negative   Final   Bacterial Vaginitis-Urine 02/06/2022 Negative   Final   Molecular Comment 02/06/2022 For tests bacteria and/or candida, this specimen does not meet the   Final   Molecular Comment 02/06/2022 strict criteria set by  the FDA. The result interpretation should be   Final   Molecular Comment 02/06/2022 considered in conjunction with the patient's clinical history.   Final   Comment 02/06/2022 Normal Reference Range Trichomonas - Negative   Final   Glucose-Capillary 02/06/2022 197 (H)  70 - 99 mg/dL Final   Glucose reference range applies only to samples taken after fasting for at least 8 hours.   Glucose-Capillary 02/07/2022 129 (H)  70 - 99 mg/dL Final   Glucose reference range applies only to samples taken after fasting for at least 8 hours.   Glucose-Capillary 02/07/2022 207 (H)  70 - 99 mg/dL Final   Glucose reference range applies only to samples taken after fasting for at least 8 hours.   Glucose-Capillary 02/07/2022 153 (H)  70 - 99 mg/dL Final   Glucose reference range applies only to samples taken after fasting for at least 8 hours.   Glucose-Capillary 02/08/2022 129 (H)  70 - 99 mg/dL Final   Glucose reference range applies only to samples taken after fasting for at least 8 hours.   Glucose-Capillary 02/08/2022 107 (H)  70 - 99 mg/dL Final   Glucose reference range applies only to samples taken after fasting for at least 8 hours.  Admission on 01/23/2022, Discharged on 01/24/2022  Component Date Value Ref Range Status   Sodium 01/23/2022 140  135 - 145 mmol/L Final   Potassium 01/23/2022 4.4  3.5 - 5.1 mmol/L Final   Chloride 01/23/2022 101  98 - 111 mmol/L Final   CO2 01/23/2022 25  22 - 32 mmol/L Final   Glucose, Bld 01/23/2022 198 (H)  70 - 99 mg/dL Final   Glucose reference range applies only to samples taken after fasting for at least 8 hours.   BUN 01/23/2022 13  6 - 20 mg/dL Final   Creatinine, Ser 01/23/2022 0.62  0.44 - 1.00 mg/dL Final   Calcium 67/61/9509 9.3  8.9 - 10.3 mg/dL Final   GFR, Estimated 01/23/2022 >60  >60 mL/min Final   Comment: (NOTE) Calculated using the CKD-EPI Creatinine Equation (2021)    Anion gap 01/23/2022 14  5 - 15 Final   Performed at [facility]  Lab, [address], [place], [place] [address]   WBC 01/23/2022 5.8  4.0 - 10.5 K/uL Final   RBC 01/23/2022 4.63  3.87 - 5.11 MIL/uL Final   Hemoglobin 01/23/2022 11.7 (L)  12.0 - 15.0 g/dL Final   HCT 24/58/0998 37.4  36.0 - 46.0 % Final   MCV 01/23/2022 80.8  80.0 - 100.0 fL Final   MCH 01/23/2022 25.3 (L)  26.0 - 34.0 pg Final   MCHC 01/23/2022 31.3  30.0 - 36.0 g/dL Final   RDW 33/82/5053 17.9 (H)  11.5 - 15.5 % Final   Platelets 01/23/2022 333  150 - 400 K/uL Final   nRBC 01/23/2022 0.0  0.0 - 0.2 % Final   Performed at [facility], [address] [place], [place] [address]   Troponin I (High Sensitivity) 01/23/2022 6  <18 ng/L Final   Comment: (NOTE) Elevated high sensitivity troponin I (hsTnI) values and significant  changes across serial measurements may suggest ACS but many other  chronic and acute conditions are known to elevate hsTnI results.  Refer to the "Links" section for chest pain algorithms and additional  guidance. Performed at [facility], [address], [place], [place] [address]    Troponin I (High Sensitivity) 01/23/2022 5  <18 ng/L Final   Comment: (NOTE) Elevated high sensitivity troponin I (hsTnI) values and significant  changes across serial measurements may suggest ACS but many other  chronic and acute conditions are known to elevate hsTnI results.  Refer to the "Links" section for chest pain algorithms and additional  guidance. Performed at [facility], [address] [place], [place] [address]    SARS Coronavirus 2 by RT PCR 01/23/2022 NEGATIVE  NEGATIVE Final   Comment: (NOTE) SARS-CoV-2 target nucleic acids are NOT DETECTED.  The SARS-CoV-2 RNA is generally detectable in upper and lower respiratory specimens during the acute phase of infection. The lowest concentration of SARS-CoV-2 viral copies this assay can detect is 250 copies / mL. A negative result does not preclude SARS-CoV-2 infection and should not be used as the  sole basis for treatment or other patient management decisions.  A negative result may occur with improper specimen collection / handling, submission of specimen other than nasopharyngeal swab, presence of viral mutation(s) within the areas targeted by this assay, and inadequate number of viral copies (<250 copies / mL). A negative result must be combined with clinical observations, patient history, and epidemiological information.  Fact Sheet for Patients:   RoadLapTop.co.zahttps://www.fda.gov/media/158405/download  Fact Sheet for Healthcare Providers: http://kim-miller.com/https://www.fda.gov/media/158404/download  This test is not yet approved or                           cleared by the [place] FDA and has been authorized for detection and/or diagnosis of SARS-CoV-2 by FDA under an Emergency Use Authorization (EUA).  This EUA will remain in effect (meaning this test can be used) for the duration of the COVID-19 declaration under Section 564(b)(1) of the Act, 21 U.S.C. section 360bbb-3(b)(1), unless the authorization is terminated or revoked sooner.  Performed at [facility], [address] [place], [place] [address]    B Natriuretic Peptide 01/23/2022 41.3  0.0 - 100.0 pg/mL Final   Performed at [facility], [address] [place], [place] [address]   Group A Strep by PCR 01/23/2022 NOT DETECTED  NOT DETECTED Final   Performed at [facility], [address] [place], [place] [address]   Color, Urine 01/23/2022 YELLOW  YELLOW Final   APPearance 01/23/2022 CLEAR  CLEAR Final   Specific Gravity, Urine 01/23/2022 1.018  1.005 - 1.030 Final   pH 01/23/2022 7.0  5.0 - 8.0 Final   Glucose, UA 01/23/2022 NEGATIVE  NEGATIVE mg/dL Final   Hgb urine dipstick 01/23/2022 NEGATIVE  NEGATIVE Final   Bilirubin Urine 01/23/2022 NEGATIVE  NEGATIVE Final   Ketones, ur 01/23/2022 NEGATIVE  NEGATIVE mg/dL Final   Protein, ur 52/84/132411/25/2023 NEGATIVE  NEGATIVE mg/dL Final   Nitrite 40/10/272511/25/2023 NEGATIVE   NEGATIVE Final   Leukocytes,Ua 01/23/2022 TRACE (A)  NEGATIVE Final   RBC / HPF 01/23/2022 0-5  0 - 5 RBC/hpf Final   WBC, UA 01/23/2022 0-5  0 - 5 WBC/hpf Final   Bacteria, UA 01/23/2022 NONE SEEN  NONE SEEN Final   Squamous Epithelial / HPF 01/23/2022 0-5  0 - 5 Final   Mucus 01/23/2022 PRESENT   Final   Performed at [facility], [address] [place], [place] [address]   SARS Coronavirus 2 by RT PCR 01/23/2022 NEGATIVE  NEGATIVE Final   Comment: (NOTE) SARS-CoV-2 target nucleic acids are NOT DETECTED.  The SARS-CoV-2 RNA is generally detectable in upper respiratory specimens during the acute phase of infection. The lowest concentration of SARS-CoV-2 viral copies this assay can detect is 138 copies/mL. A negative result does not preclude SARS-Cov-2 infection and should not be used as the sole basis for treatment or other patient management decisions. A negative result may occur with  improper specimen collection/handling, submission of specimen other than nasopharyngeal swab, presence of viral mutation(s) within the areas targeted by this assay, and inadequate number of viral copies(<138 copies/mL). A negative result must be combined with clinical observations, patient history, and epidemiological information. The expected result is Negative.  Fact Sheet for Patients:  BloggerCourse.comhttps://www.fda.gov/media/152166/download  Fact Sheet for Healthcare Providers:  SeriousBroker.ithttps://www.fda.gov/media/152162/download  This test is no                          t yet approved or cleared by the [place] FDA and  has been authorized for detection and/or diagnosis of SARS-CoV-2 by FDA under an Emergency Use Authorization (EUA). This EUA will remain  in effect (meaning this test can be used) for the duration of the COVID-19 declaration under Section 564(b)(1) of the Act, 21 U.S.C.section 360bbb-3(b)(1), unless the authorization is terminated  or revoked sooner.       Influenza A by PCR  01/23/2022 NEGATIVE  NEGATIVE Final   Influenza B by PCR 01/23/2022 NEGATIVE  NEGATIVE Final   Comment: (NOTE) The Xpert Xpress SARS-CoV-2/FLU/RSV plus assay is intended as an aid in the diagnosis of influenza from Nasopharyngeal swab specimens and should not be used as a sole basis for treatment. Nasal washings and aspirates are unacceptable for Xpert Xpress SARS-CoV-2/FLU/RSV testing.  Fact Sheet for Patients: BloggerCourse.comhttps://www.fda.gov/media/152166/download  Fact Sheet for Healthcare Providers: SeriousBroker.ithttps://www.fda.gov/media/152162/download  This test is not yet approved or cleared by the [place] FDA and has been authorized for detection and/or diagnosis of SARS-CoV-2 by FDA under an Emergency Use Authorization (EUA). This EUA will remain in effect (meaning this test can be used) for the duration of the COVID-19 declaration under Section 564(b)(1) of the Act, 21 U.S.C. section 360bbb-3(b)(1), unless the authorization is terminated or revoked.  Performed at [facility], [address] [place], [place] [address]    WBC 01/24/2022 6.1  4.0 - 10.5 K/uL Final   RBC 01/24/2022 4.60  3.87 - 5.11 MIL/uL Final   Hemoglobin 01/24/2022 11.7 (L)  12.0 - 15.0 g/dL Final   HCT 29/56/213011/26/2023 37.0  36.0 - 46.0 % Final   MCV 01/24/2022 80.4  80.0 - 100.0 fL Final   MCH 01/24/2022 25.4 (L)  26.0 - 34.0 pg Final   MCHC 01/24/2022 31.6  30.0 - 36.0 g/dL Final   RDW 86/57/846911/26/2023 17.6 (H)  11.5 - 15.5 % Final   Platelets 01/24/2022 334  150 - 400 K/uL Final   nRBC 01/24/2022 0.0  0.0 - 0.2 % Final   Neutrophils Relative % 01/24/2022 53  % Final   Neutro Abs 01/24/2022 3.3  1.7 - 7.7 K/uL Final   Lymphocytes Relative 01/24/2022 33  % Final   Lymphs Abs 01/24/2022 2.0  0.7 - 4.0 K/uL Final   Monocytes Relative 01/24/2022 11  % Final   Monocytes Absolute 01/24/2022 0.7  0.1 - 1.0 K/uL Final   Eosinophils Relative 01/24/2022 2  % Final   Eosinophils Absolute 01/24/2022 0.1  0.0 - 0.5 K/uL Final    Basophils Relative 01/24/2022 1  % Final   Basophils Absolute 01/24/2022 0.1  0.0 - 0.1 K/uL Final   Immature Granulocytes 01/24/2022 0  % Final   Abs Immature Granulocytes 01/24/2022 0.02  0.00 - 0.07 K/uL Final   Performed at [facility]  Lab, [address] [place], [place] [address]   Glucose-Capillary 01/24/2022 110 (H)  70 - 99 mg/dL Final   Glucose  reference range applies only to samples taken after fasting for at least 8 hours.  No results displayed because visit has over 200 results.    Admission on 11/22/2021, Discharged on 11/22/2021  Component Date Value Ref Range Status   Glucose-Capillary 11/22/2021 169 (H)  70 - 99 mg/dL Final   Glucose reference range applies only to samples taken after fasting for at least 8 hours.  No results displayed because visit has over 200 results.    Admission on 10/04/2021, Discharged on 10/22/2021  Component Date Value Ref Range Status   SARS Coronavirus 2 by RT PCR 10/04/2021 NEGATIVE  NEGATIVE Final   Comment: (NOTE) SARS-CoV-2 target nucleic acids are NOT DETECTED.  The SARS-CoV-2 RNA is generally detectable in upper respiratory specimens during the acute phase of infection. The lowest concentration of SARS-CoV-2 viral copies this assay can detect is 138 copies/mL. A negative result does not preclude SARS-Cov-2 infection and should not be used as the sole basis for treatment or other patient management decisions. A negative result may occur with  improper specimen collection/handling, submission of specimen other than nasopharyngeal swab, presence of viral mutation(s) within the areas targeted by this assay, and inadequate number of viral copies(<138 copies/mL). A negative result must be combined with clinical observations, patient history, and epidemiological information. The expected result is Negative.  Fact Sheet for Patients:  BloggerCourse.com  Fact Sheet for Healthcare Providers:   SeriousBroker.it  This test is no                          t yet approved or cleared by the [place] FDA and  has been authorized for detection and/or diagnosis of SARS-CoV-2 by FDA under an Emergency Use Authorization (EUA). This EUA will remain  in effect (meaning this test can be used) for the duration of the COVID-19 declaration under Section 564(b)(1) of the Act, 21 U.S.C.section 360bbb-3(b)(1), unless the authorization is terminated  or revoked sooner.       Influenza A by PCR 10/04/2021 NEGATIVE  NEGATIVE Final   Influenza B by PCR 10/04/2021 NEGATIVE  NEGATIVE Final   Comment: (NOTE) The Xpert Xpress SARS-CoV-2/FLU/RSV plus assay is intended as an aid in the diagnosis of influenza from Nasopharyngeal swab specimens and should not be used as a sole basis for treatment. Nasal washings and aspirates are unacceptable for Xpert Xpress SARS-CoV-2/FLU/RSV testing.  Fact Sheet for Patients: BloggerCourse.com  Fact Sheet for Healthcare Providers: SeriousBroker.it  This test is not yet approved or cleared by the [place] FDA and has been authorized for detection and/or diagnosis of SARS-CoV-2 by FDA under an Emergency Use Authorization (EUA). This EUA will remain in effect (meaning this test can be used) for the duration of the COVID-19 declaration under Section 564(b)(1) of the Act, 21 U.S.C. section 360bbb-3(b)(1), unless the authorization is terminated or revoked.  Performed at [facility], [address] [place], [place] [address]    WBC 10/04/2021 8.4  4.0 - 10.5 K/uL Final   RBC 10/04/2021 4.42  3.87 - 5.11 MIL/uL Final   Hemoglobin 10/04/2021 11.6 (L)  12.0 - 15.0 g/dL Final   HCT 21/30/8657 35.7 (L)  36.0 - 46.0 % Final   MCV 10/04/2021 80.8  80.0 - 100.0 fL Final   MCH 10/04/2021 26.2  26.0 - 34.0 pg Final   MCHC 10/04/2021 32.5  30.0 - 36.0 g/dL Final   RDW 84/69/6295  16.0 (H)  11.5 - 15.5 %  Final   Platelets 10/04/2021 372  150 - 400 K/uL Final   nRBC 10/04/2021 0.0  0.0 - 0.2 % Final   Neutrophils Relative % 10/04/2021 68  % Final   Neutro Abs 10/04/2021 5.7  1.7 - 7.7 K/uL Final   Lymphocytes Relative 10/04/2021 22  % Final   Lymphs Abs 10/04/2021 1.8  0.7 - 4.0 K/uL Final   Monocytes Relative 10/04/2021 8  % Final   Monocytes Absolute 10/04/2021 0.7  0.1 - 1.0 K/uL Final   Eosinophils Relative 10/04/2021 1  % Final   Eosinophils Absolute 10/04/2021 0.1  0.0 - 0.5 K/uL Final   Basophils Relative 10/04/2021 1  % Final   Basophils Absolute 10/04/2021 0.1  0.0 - 0.1 K/uL Final   Immature Granulocytes 10/04/2021 0  % Final   Abs Immature Granulocytes 10/04/2021 0.03  0.00 - 0.07 K/uL Final   Performed at [facility], [address], [place], [place] [address]   Sodium 10/04/2021 136  135 - 145 mmol/L Final   Potassium 10/04/2021 4.2  3.5 - 5.1 mmol/L Final   Chloride 10/04/2021 104  98 - 111 mmol/L Final   CO2 10/04/2021 25  22 - 32 mmol/L Final   Glucose, Bld 10/04/2021 100 (H)  70 - 99 mg/dL Final   Glucose reference range applies only to samples taken after fasting for at least 8 hours.   BUN 10/04/2021 9  6 - 20 mg/dL Final   Creatinine, Ser 10/04/2021 0.52  0.44 - 1.00 mg/dL Final   Calcium 60/45/4098 9.0  8.9 - 10.3 mg/dL Final   Total Protein 11/91/4782 7.0  6.5 - 8.1 g/dL Final   Albumin 95/62/1308 3.2 (L)  3.5 - 5.0 g/dL Final   AST 65/78/4696 13 (L)  15 - 41 U/L Final   ALT 10/04/2021 10  0 - 44 U/L Final   Alkaline Phosphatase 10/04/2021 61  38 - 126 U/L Final   Total Bilirubin 10/04/2021 0.3  0.3 - 1.2 mg/dL Final   GFR, Estimated 10/04/2021 >60  >60 mL/min Final   Comment: (NOTE) Calculated using the CKD-EPI Creatinine Equation (2021)    Anion gap 10/04/2021 7  5 - 15 Final   Performed at [facility], [address] [place], [place] [address]   Hgb A1c MFr Bld 10/04/2021 7.0 (H)  4.8 - 5.6 % Final   Comment:  (NOTE) Pre diabetes:          5.7%-6.4%  Diabetes:              >6.4%  Glycemic control for   <7.0% adults with diabetes    Mean Plasma Glucose 10/04/2021 154.2  mg/dL Final   Performed at [facility], [address] [place], [place] [address]   Cholesterol 10/04/2021 178  0 - 200 mg/dL Final   Triglycerides 40/11/2723 155 (H)  <150 mg/dL Final   HDL 36/64/4034 52  >40 mg/dL Final   Total CHOL/HDL Ratio 10/04/2021 3.4  RATIO Final   VLDL 10/04/2021 31  0 - 40 mg/dL Final   LDL Cholesterol 10/04/2021 95  0 - 99 mg/dL Final   Comment:        Total Cholesterol/HDL:CHD Risk Coronary Heart Disease Risk Table                     Men   Women  1/2 Average Risk   3.4   3.3  Average Risk  5.0   4.4  2 X Average Risk   9.6   7.1  3 X Average Risk  23.4   11.0        Use the calculated Patient Ratio above and the CHD Risk Table to determine the patient's CHD Risk.        ATP III CLASSIFICATION (LDL):  <100     mg/dL   Optimal  409-811  mg/dL   Near or Above                    Optimal  130-159  mg/dL   Borderline  914-782  mg/dL   High  >956     mg/dL   Very High Performed at [facility], [address] [place], [place] [address]    POC Amphetamine UR 10/04/2021 None Detected  NONE DETECTED (Cut Off Level 1000 ng/mL) Final   POC Secobarbital (BAR) 10/04/2021 None Detected  NONE DETECTED (Cut Off Level 300 ng/mL) Final   POC Buprenorphine (BUP) 10/04/2021 None Detected  NONE DETECTED (Cut Off Level 10 ng/mL) Final   POC Oxazepam (BZO) 10/04/2021 None Detected  NONE DETECTED (Cut Off Level 300 ng/mL) Final   POC Cocaine UR 10/04/2021 None Detected  NONE DETECTED (Cut Off Level 300 ng/mL) Final   POC Methamphetamine UR 10/04/2021 None Detected  NONE DETECTED (Cut Off Level 1000 ng/mL) Final   POC Morphine 10/04/2021 None Detected  NONE DETECTED (Cut Off Level 300 ng/mL) Final   POC Methadone UR 10/04/2021 None Detected  NONE DETECTED (Cut Off Level 300 ng/mL) Final    POC Oxycodone UR 10/04/2021 Positive (A)  NONE DETECTED (Cut Off Level 100 ng/mL) Final   POC Marijuana UR 10/04/2021 None Detected  NONE DETECTED (Cut Off Level 50 ng/mL) Final   SARSCOV2ONAVIRUS 2 AG 10/04/2021 NEGATIVE  NEGATIVE Final   Comment: (NOTE) SARS-CoV-2 antigen NOT DETECTED.   Negative results are presumptive.  Negative results do not preclude SARS-CoV-2 infection and should not be used as the sole basis for treatment or other patient management decisions, including infection  control decisions, particularly in the presence of clinical signs and  symptoms consistent with COVID-19, or in those who have been in contact with the virus.  Negative results must be combined with clinical observations, patient history, and epidemiological information. The expected result is Negative.  Fact Sheet for Patients: https://www.jennings-kim.com/  Fact Sheet for Healthcare Providers: https://alexander-rogers.biz/  This test is not yet approved or cleared by the [place] FDA and  has been authorized for detection and/or diagnosis of SARS-CoV-2 by FDA under an Emergency Use Authorization (EUA).  This EUA will remain in effect (meaning this test can be used) for the duration of  the COV                          ID-19 declaration under Section 564(b)(1) of the Act, 21 U.S.C. section 360bbb-3(b)(1), unless the authorization is terminated or revoked sooner.     Valproic Acid Lvl 10/04/2021 51  50.0 - 100.0 ug/mL Final   Performed at [facility], [address], [place], [place] [address]   Valproic Acid Lvl 10/08/2021 57  50.0 - 100.0 ug/mL Final   Performed at [facility], [address] [place], [place] [address]   Glucose-Capillary 10/09/2021 123 (H)  70 - 99 mg/dL Final   Glucose reference range applies only to samples taken after fasting for at least  8 hours.  There may be more visits with results that are not included.    Blood  Alcohol level:  Lab Results  Component Value Date   ETH <10 11/23/2021   ETH <10 11/08/2021    Metabolic Disorder Labs: Lab Results  Component Value Date   HGBA1C 6.7 (H) 12/28/2021   MPG 145.59 12/28/2021   MPG 154.2 10/04/2021   Lab Results  Component Value Date   PROLACTIN 3.8 (L) 11/23/2021   Lab Results  Component Value Date   CHOL 171 11/23/2021   TRIG 125 11/23/2021   HDL 65 11/23/2021   CHOLHDL 2.6 11/23/2021   VLDL 25 11/23/2021   LDLCALC 81 11/23/2021   LDLCALC 95 10/04/2021    Therapeutic Lab Levels: No results found for: "LITHIUM" Lab Results  Component Value Date   VALPROATE 17 (L) 02/17/2022   VALPROATE 33 (L) 01/18/2022   No results found for: "CBMZ"  Physical Findings   PHQ2-9    Flowsheet Row ED from 11/23/2021 in [facility]  PHQ-2 Total Score 2  PHQ-9 Total Score 3      Flowsheet Row ED from 02/18/2022 in [facility] ED from 02/17/2022 in [facility] EMERGENCY DEPARTMENT ED from 02/09/2022 in [facility]  C-SSRS RISK CATEGORY No Risk No Risk No Risk        Musculoskeletal  Strength & Muscle Tone: within normal limits Gait & Station: normal Patient leans: N/A  Psychiatric Specialty Exam  Presentation  General Appearance:  Disheveled  Eye Contact: Good  Speech: Clear and Coherent; Normal Rate  Speech Volume: Normal  Handedness: Right   Mood and Affect  Mood: Euthymic  Affect: Congruent   Thought Process  Thought Processes: Coherent  Descriptions of Associations:Intact  Orientation:Full (Time, Place and Person)  Thought Content:Logical  Diagnosis of Schizophrenia or Schizoaffective disorder in past: Yes  Duration of Psychotic Symptoms: No data recorded  Hallucinations:Hallucinations: None  Ideas of Reference:None  Suicidal Thoughts:Suicidal Thoughts: No  Homicidal Thoughts:Homicidal Thoughts:  No   Sensorium  Memory: Immediate Good; Recent Good; Remote Good  Judgment: Fair  Insight: Fair   Chartered certified accountant: Fair  Attention Span: Fair  Recall: [employer] of Knowledge: Fair  Language: Fair   Psychomotor Activity  Psychomotor Activity: Psychomotor Activity: Normal   Assets  Assets: Communication Skills; Desire for Improvement; Financial Resources/Insurance; Physical Health; Resilience   Sleep  Sleep: Sleep: Good   No data recorded  Physical Exam  Physical Exam Vitals and nursing note reviewed.  Constitutional:      General: She is not in acute distress.    Appearance: Normal appearance. She is not ill-appearing.  HENT:     Head: Normocephalic.  Eyes:     General:        Right eye: No discharge.        Left eye: No discharge.     Conjunctiva/sclera: Conjunctivae normal.  Cardiovascular:     Rate and Rhythm: Normal rate.  Pulmonary:     Effort: Pulmonary effort is normal.  Musculoskeletal:        General: Normal range of motion.     Cervical back: Normal range of motion.  Skin:    General: Skin is warm and dry.  Neurological:     Mental Status: She is alert and oriented to person, place, and time.  Psychiatric:        Attention and Perception: Attention and perception normal.  Mood and Affect: Affect normal.        Speech: Speech normal.        Behavior: Behavior normal. Behavior is cooperative.        Thought Content: Thought content normal.        Cognition and Memory: Cognition normal.        Judgment: Judgment normal.    Review of Systems  Constitutional: Negative.   HENT: Negative.    Eyes: Negative.   Respiratory: Negative.    Cardiovascular: Negative.   Musculoskeletal: Negative.   Skin: Negative.   Neurological: Negative.   Psychiatric/Behavioral: Negative.     Blood pressure 129/85, pulse 97, temperature 97.7 F (36.5 C), temperature source Oral, resp. rate 16, SpO2 94 %. There is no  height or weight on file to calculate BMI.  Treatment Plan Summary: Plan Pt remains psychiatrically cleared. Pt has been accepted to placement at [facility] on 03/15/21 at 1PM.    Ardis Hughs, NP 03/06/2022 1:57 PM

## 2022-03-06 NOTE — ED Notes (Signed)
Pt is in the bed sleeping. Respirations are even and unlabored. No acute distress noted. Will continue to monitor for safety. 

## 2022-03-06 NOTE — ED Notes (Signed)
Pt accepted scheduled morning meds w/o difficulty. Breakfast provided. Safety maintained and will continue to monitor.

## 2022-03-07 DIAGNOSIS — F209 Schizophrenia, unspecified: Secondary | ICD-10-CM | POA: Diagnosis not present

## 2022-03-07 LAB — GLUCOSE, CAPILLARY
Glucose-Capillary: 141 mg/dL — ABNORMAL HIGH (ref 70–99)
Glucose-Capillary: 157 mg/dL — ABNORMAL HIGH (ref 70–99)
Glucose-Capillary: 199 mg/dL — ABNORMAL HIGH (ref 70–99)
Glucose-Capillary: 241 mg/dL — ABNORMAL HIGH (ref 70–99)

## 2022-03-07 NOTE — ED Notes (Signed)
Patient  resting in no acute stress. RR even and unlabored .Environment secured .Will continue to monitor for safely. 

## 2022-03-07 NOTE — ED Notes (Signed)
Patient is watching tv. Alert and oriented. Will continue to monitor for safety. 

## 2022-03-07 NOTE — ED Notes (Signed)
Patient alert and oriented x 3. Denies SI/HI/AVH. Denies intent or plan to harm self or others. Routine conducted according to faculty protocol. Encourage patient to notify staff with any needs or concerns. Patient verbalized agreement and understanding. Will continue to monitor for safety. 

## 2022-03-07 NOTE — ED Notes (Signed)
Patient blood sugar was 199 . Denies any s/s of hyperglycemia.

## 2022-03-07 NOTE — ED Notes (Signed)
Patient resting on and off - no sxs of distress noted - will continue to monitor for safety

## 2022-03-07 NOTE — ED Notes (Signed)
Patient resting on and off - no sxs of distress noted - will continue to monitor for safety 

## 2022-03-07 NOTE — ED Notes (Signed)
Pt sleeping at present, no distress noted.  Monitoring for safety. 

## 2022-03-07 NOTE — ED Notes (Signed)
Pt A&O x 4, no distress noted., calm & cooperative, monitoring for safety. 

## 2022-03-07 NOTE — ED Provider Notes (Signed)
Behavioral Health Progress Note  Date and Time: 03/07/2022 12:39 PM Name: Teresa Murillo MRN:  [id number]  HPI:  Teresa Murillo is a 62 y/o female with PMH of schizophrenia, borderline intellectual functioning, tobacco use d/o, HTN, HLD, DM2 (insulin-dependent), atrial flutter, COPD, admitted to [facility] on 11/23/21 voluntarily, accompanied by [employer], and reported that she was locked out of her group home. It was later confirmed that [name] was dismissed from group home due to recurrent history of eloping from the group home. Teresa Murillo has been boarding at this facility until placement is secured.    Teresa Murillo, 62 y.o., female patient seen face to face by this provider and chart reviewed on 03/07/22  Subjective:  On today's assessment Teresa Murillo is observed laying in her bed awake.  She is pleasant upon approach.  She is alert/oriented x 4, calm, cooperative, and attentive.  She has normal speech and behavior.  Reports she feels a little sleepy this morning but attributes this to her morning medications.  She is denying any concerns with appetite or sleep.  She is denying SI/HI/AVH.  She continues to look forward to her upcoming placement.  Reassurance and encouragement provided.      Pt remains psychiatrically cleared. Pt has been accepted to placement at [facility] on 03/15/21 at 1PM.      Diagnosis:  Final diagnoses:  Schizophrenia, unspecified type (HCC)    Total Time spent with patient: 30 minutes  Past Psychiatric History: see h&p Past Medical History:  Past Medical History:  Diagnosis Date   Borderline intellectual functioning 12/14/2021   On 12/14/2021: Appreciate assistance from psychology consult. On the Wechsler Adult Intelligence Scale-4, [name] achieved a full-scale IQ score of 73 and a percentile rank of 4 placing her in the borderline range of intellectual functioning.    Chronic obstructive pulmonary disease (COPD) (HCC)    Glaucoma    Hyperlipidemia     Hypertension    Iron deficiency    Schizoaffective disorder (HCC)    Type 2 diabetes mellitus (HCC)     Past Surgical History:  Procedure Laterality Date   TUBAL LIGATION     Family History:  Family History  Problem Relation Age of Onset   Breast cancer Maternal Grandmother    Family Psychiatric  History: see H&p Social History:  Social History   Substance and Sexual Activity  Alcohol Use Not Currently     Social History   Substance and Sexual Activity  Drug Use Not Currently    Social History   Socioeconomic History   Marital status: Divorced    Spouse name: Not on file   Number of children: Not on file   Years of education: Not on file   Highest education level: Not on file  Occupational History   Not on file  Tobacco Use   Smoking status: Former    Packs/day: 1.00    Types: Cigars, Cigarettes    Quit date: 11/09/2021    Years since quitting: 0.3   Smokeless tobacco: Current  Vaping Use   Vaping Use: Never used  Substance and Sexual Activity   Alcohol use: Not Currently   Drug use: Not Currently   Sexual activity: Not Currently    Birth control/protection: Surgical  Other Topics Concern   Not on file  Social History Narrative   Not on file   Social Determinants of Health   Financial Resource Strain: Not on file  Food Insecurity: Not on file  Transportation Needs: Not on  file  Physical Activity: Not on file  Stress: Not on file  Social Connections: Not on file   SDOH:  SDOH Screenings   Depression (PHQ2-9): Low Risk  (11/23/2021)  Tobacco Use: High Risk (02/17/2022)   Additional Social History:                         Sleep: Good  Appetite:  Good  Current Medications:  Current Facility-Administered Medications  Medication Dose Route Frequency Provider Last Rate Last Admin   acetaminophen (TYLENOL) tablet 650 mg  650 mg Oral Q6H PRN Sindy Guadeloupe, NP   650 mg at 03/03/22 2130   albuterol (VENTOLIN HFA) 108 (90 Base) MCG/ACT  inhaler 2 puff  2 puff Inhalation Q6H PRN Sindy Guadeloupe, NP   2 puff at 02/20/22 1944   alum & mag hydroxide-simeth (MAALOX/MYLANTA) 200-200-20 MG/5ML suspension 30 mL  30 mL Oral Q4H PRN Sindy Guadeloupe, NP   30 mL at 03/07/22 0231   apixaban (ELIQUIS) tablet 5 mg  5 mg Oral BID Sindy Guadeloupe, NP   5 mg at 03/07/22 0914   ARIPiprazole (ABILIFY) tablet 10 mg  10 mg Oral Daily Sindy Guadeloupe, NP   10 mg at 03/07/22 0914   cloZAPine (CLOZARIL) tablet 75 mg  75 mg Oral BID Park Pope, MD   75 mg at 03/07/22 5784   colchicine tablet 0.6 mg  0.6 mg Oral QHS Sindy Guadeloupe, NP   0.6 mg at 03/06/22 2148   diltiazem (CARDIZEM CD) 24 hr capsule 240 mg  240 mg Oral Daily Sindy Guadeloupe, NP   240 mg at 03/07/22 0914   divalproex (DEPAKOTE ER) 24 hr tablet 500 mg  500 mg Oral BID Sindy Guadeloupe, NP   500 mg at 03/07/22 6962   haloperidol (HALDOL) tablet 10 mg  10 mg Oral TID PRN Sindy Guadeloupe, NP       insulin aspart (novoLOG) injection 0-9 Units  0-9 Units Subcutaneous TID WC Park Pope, MD   2 Units at 03/07/22 1142   insulin glargine-yfgn (SEMGLEE) injection 12 Units  12 Units Subcutaneous Daily Sindy Guadeloupe, NP   12 Units at 03/07/22 0914   latanoprost (XALATAN) 0.005 % ophthalmic solution 1 drop  1 drop Both Eyes QHS Sindy Guadeloupe, NP   1 drop at 03/06/22 2152   magnesium hydroxide (MILK OF MAGNESIA) suspension 30 mL  30 mL Oral Daily PRN Sindy Guadeloupe, NP       metFORMIN (GLUCOPHAGE) tablet 500 mg  500 mg Oral BID WC Sindy Guadeloupe, NP   500 mg at 03/07/22 0739   mometasone-formoterol (DULERA) 200-5 MCG/ACT inhaler 2 puff  2 puff Inhalation BID Sindy Guadeloupe, NP   2 puff at 03/07/22 0743   multivitamins with iron tablet 1 tablet  1 tablet Oral QHS Princess Bruins, DO   1 tablet at 03/06/22 2149   nicotine (NICODERM CQ - dosed in mg/24 hr) patch 7 mg  7 mg Transdermal Daily Sindy Guadeloupe, NP   7 mg at 03/07/22 0916   rosuvastatin (CRESTOR) tablet 20 mg  20 mg Oral QHS Princess Bruins, DO   20 mg at 03/06/22 2148    valbenazine (INGREZZA) capsule 40 mg  40 mg Oral q AM Sindy Guadeloupe, NP   40 mg at 03/07/22 0603   Vitamin D (Ergocalciferol) (DRISDOL) 1.25 MG (50000 UNIT) capsule 50,000 Units  50,000 Units Oral Q Crista Curb, NP   50,000 Units at 03/04/22 9528   Current Outpatient  Medications  Medication Sig Dispense Refill   Accu-Chek Softclix Lancets lancets Use as directed up to four times daily 100 each 0   apixaban (ELIQUIS) 5 MG TABS tablet Take 1 tablet (5 mg total) by mouth 2 (two) times daily. 60 tablet 0   ARIPiprazole (ABILIFY) 10 MG tablet Take 1 tablet (10 mg total) by mouth daily. 30 tablet 0   Blood Glucose Monitoring Suppl (ACCU-CHEK GUIDE) w/Device KIT Use as directed up to four times daily 1 kit 0   budesonide-formoterol (SYMBICORT) 160-4.5 MCG/ACT inhaler Inhale 2 puffs into the lungs in the morning and at bedtime.     Cholecalciferol (VITAMIN D3) 1.25 MG (50000 UT) CAPS Take 50,000 Units by mouth every Thursday.     clozapine (CLOZARIL) 50 MG tablet Take 1 tablet (50 mg total) by mouth daily. 30 tablet 0   colchicine 0.6 MG tablet Take 1 tablet (0.6 mg total) by mouth at bedtime. 30 tablet 0   diltiazem (CARDIZEM CD) 240 MG 24 hr capsule Take 1 capsule (240 mg total) by mouth daily. (Patient not taking: Reported on 11/24/2021) 30 capsule 0   divalproex (DEPAKOTE ER) 500 MG 24 hr tablet Take 1 tablet (500 mg total) by mouth 2 (two) times daily. 60 tablet 0   glucose blood test strip Use as directed up to four times daily 50 each 0   haloperidol (HALDOL) 10 MG tablet Take 1 tablet (10 mg total) by mouth 3 (three) times daily as needed for agitation (and psychotic symptoms).     INGREZZA 40 MG capsule Take 1 capsule (40 mg total) by mouth in the morning. 30 capsule 0   insulin aspart (NOVOLOG) 100 UNIT/ML FlexPen Before each meal 3 times a day, 140-199 - 2 units, 200-250 - 4 units, 251-299 - 6 units,  300-349 - 8 units,  350 or above 10 units. Insulin PEN if approved, provide syringes and  needles if needed.Please switch to any approved short acting Insulin if needed. 15 mL 0   insulin glargine (LANTUS) 100 UNIT/ML Solostar Pen Inject 12 Units into the skin daily. 15 mL 0   Insulin Pen Needle 32G X 4 MM MISC Use 4 times a day with insulin, 1 month supply. 100 each 0   latanoprost (XALATAN) 0.005 % ophthalmic solution Place 1 drop into both eyes at bedtime.     metFORMIN (GLUCOPHAGE) 500 MG tablet Take 500 mg by mouth 2 (two) times daily with a meal.     nicotine (NICODERM CQ - DOSED IN MG/24 HR) 7 mg/24hr patch Place 1 patch (7 mg total) onto the skin daily. 28 patch 0   PROAIR HFA 108 (90 Base) MCG/ACT inhaler Inhale 2 puffs into the lungs every 6 (six) hours as needed for wheezing or shortness of breath.      Labs  Lab Results:  Admission on 02/18/2022  Component Date Value Ref Range Status   Glucose-Capillary 02/18/2022 254 (H)  70 - 99 mg/dL Final   Glucose reference range applies only to samples taken after fasting for at least 8 hours.   Glucose-Capillary 02/18/2022 112 (H)  70 - 99 mg/dL Final   Glucose reference range applies only to samples taken after fasting for at least 8 hours.   Glucose-Capillary 02/19/2022 159 (H)  70 - 99 mg/dL Final   Glucose reference range applies only to samples taken after fasting for at least 8 hours.   Glucose-Capillary 02/19/2022 160 (H)  70 - 99 mg/dL Final   Glucose reference  range applies only to samples taken after fasting for at least 8 hours.   Glucose-Capillary 02/19/2022 177 (H)  70 - 99 mg/dL Final   Glucose reference range applies only to samples taken after fasting for at least 8 hours.   Glucose-Capillary 02/19/2022 147 (H)  70 - 99 mg/dL Final   Glucose reference range applies only to samples taken after fasting for at least 8 hours.   Glucose-Capillary 02/20/2022 126 (H)  70 - 99 mg/dL Final   Glucose reference range applies only to samples taken after fasting for at least 8 hours.   Glucose-Capillary 02/20/2022 132 (H)   70 - 99 mg/dL Final   Glucose reference range applies only to samples taken after fasting for at least 8 hours.   Glucose-Capillary 02/20/2022 125 (H)  70 - 99 mg/dL Final   Glucose reference range applies only to samples taken after fasting for at least 8 hours.   Glucose-Capillary 02/21/2022 151 (H)  70 - 99 mg/dL Final   Glucose reference range applies only to samples taken after fasting for at least 8 hours.   Glucose-Capillary 02/21/2022 106 (H)  70 - 99 mg/dL Final   Glucose reference range applies only to samples taken after fasting for at least 8 hours.   Glucose-Capillary 02/21/2022 149 (H)  70 - 99 mg/dL Final   Glucose reference range applies only to samples taken after fasting for at least 8 hours.   Glucose-Capillary 02/22/2022 131 (H)  70 - 99 mg/dL Final   Glucose reference range applies only to samples taken after fasting for at least 8 hours.   Glucose-Capillary 02/22/2022 141 (H)  70 - 99 mg/dL Final   Glucose reference range applies only to samples taken after fasting for at least 8 hours.   Glucose-Capillary 02/22/2022 183 (H)  70 - 99 mg/dL Final   Glucose reference range applies only to samples taken after fasting for at least 8 hours.   Glucose-Capillary 02/23/2022 133 (H)  70 - 99 mg/dL Final   Glucose reference range applies only to samples taken after fasting for at least 8 hours.   WBC 02/23/2022 4.8  4.0 - 10.5 K/uL Final   RBC 02/23/2022 3.96  3.87 - 5.11 MIL/uL Final   Hemoglobin 02/23/2022 9.7 (L)  12.0 - 15.0 g/dL Final   HCT 89/21/1941 30.6 (L)  36.0 - 46.0 % Final   MCV 02/23/2022 77.3 (L)  80.0 - 100.0 fL Final   MCH 02/23/2022 24.5 (L)  26.0 - 34.0 pg Final   MCHC 02/23/2022 31.7  30.0 - 36.0 g/dL Final   RDW 74/09/1446 16.5 (H)  11.5 - 15.5 % Final   Platelets 02/23/2022 372  150 - 400 K/uL Final   nRBC 02/23/2022 0.0  0.0 - 0.2 % Final   Neutrophils Relative % 02/23/2022 56  % Final   Neutro Abs 02/23/2022 2.7  1.7 - 7.7 K/uL Final   Lymphocytes  Relative 02/23/2022 32  % Final   Lymphs Abs 02/23/2022 1.5  0.7 - 4.0 K/uL Final   Monocytes Relative 02/23/2022 9  % Final   Monocytes Absolute 02/23/2022 0.4  0.1 - 1.0 K/uL Final   Eosinophils Relative 02/23/2022 2  % Final   Eosinophils Absolute 02/23/2022 0.1  0.0 - 0.5 K/uL Final   Basophils Relative 02/23/2022 1  % Final   Basophils Absolute 02/23/2022 0.1  0.0 - 0.1 K/uL Final   Immature Granulocytes 02/23/2022 0  % Final   Abs Immature Granulocytes 02/23/2022 0.01  0.00 - 0.07 K/uL Final   Performed at [facility], [address], [place], [place] [address]   Glucose-Capillary 02/23/2022 202 (H)  70 - 99 mg/dL Final   Glucose reference range applies only to samples taken after fasting for at least 8 hours.   Glucose-Capillary 02/23/2022 143 (H)  70 - 99 mg/dL Final   Glucose reference range applies only to samples taken after fasting for at least 8 hours.   Glucose-Capillary 02/23/2022 143 (H)  70 - 99 mg/dL Final   Glucose reference range applies only to samples taken after fasting for at least 8 hours.   Glucose-Capillary 02/24/2022 207 (H)  70 - 99 mg/dL Final   Glucose reference range applies only to samples taken after fasting for at least 8 hours.   Glucose-Capillary 02/24/2022 109 (H)  70 - 99 mg/dL Final   Glucose reference range applies only to samples taken after fasting for at least 8 hours.   Glucose-Capillary 02/24/2022 127 (H)  70 - 99 mg/dL Final   Glucose reference range applies only to samples taken after fasting for at least 8 hours.   Glucose-Capillary 02/24/2022 130 (H)  70 - 99 mg/dL Final   Glucose reference range applies only to samples taken after fasting for at least 8 hours.   Glucose-Capillary 02/25/2022 117 (H)  70 - 99 mg/dL Final   Glucose reference range applies only to samples taken after fasting for at least 8 hours.   Glucose-Capillary 02/25/2022 104 (H)  70 - 99 mg/dL Final   Glucose reference range applies only to samples taken after  fasting for at least 8 hours.   Glucose-Capillary 02/25/2022 180 (H)  70 - 99 mg/dL Final   Glucose reference range applies only to samples taken after fasting for at least 8 hours.   Glucose-Capillary 02/25/2022 180 (H)  70 - 99 mg/dL Final   Glucose reference range applies only to samples taken after fasting for at least 8 hours.   Glucose-Capillary 02/26/2022 133 (H)  70 - 99 mg/dL Final   Glucose reference range applies only to samples taken after fasting for at least 8 hours.   Glucose-Capillary 02/26/2022 85  70 - 99 mg/dL Final   Glucose reference range applies only to samples taken after fasting for at least 8 hours.   Glucose-Capillary 02/26/2022 136 (H)  70 - 99 mg/dL Final   Glucose reference range applies only to samples taken after fasting for at least 8 hours.   Glucose-Capillary 02/26/2022 170 (H)  70 - 99 mg/dL Final   Glucose reference range applies only to samples taken after fasting for at least 8 hours.   Glucose-Capillary 02/26/2022 185 (H)  70 - 99 mg/dL Final   Glucose reference range applies only to samples taken after fasting for at least 8 hours.   Glucose-Capillary 02/27/2022 124 (H)  70 - 99 mg/dL Final   Glucose reference range applies only to samples taken after fasting for at least 8 hours.   Glucose-Capillary 02/27/2022 161 (H)  70 - 99 mg/dL Final   Glucose reference range applies only to samples taken after fasting for at least 8 hours.   Glucose-Capillary 02/27/2022 170 (H)  70 - 99 mg/dL Final   Glucose reference range applies only to samples taken after fasting for at least 8 hours.   Glucose-Capillary 02/27/2022 154 (H)  70 - 99 mg/dL Final   Glucose reference range applies only to samples taken after fasting for at least 8 hours.  Glucose-Capillary 02/28/2022 109 (H)  70 - 99 mg/dL Final   Glucose reference range applies only to samples taken after fasting for at least 8 hours.   Glucose-Capillary 02/28/2022 134 (H)  70 - 99 mg/dL Final   Glucose  reference range applies only to samples taken after fasting for at least 8 hours.   Glucose-Capillary 02/28/2022 160 (H)  70 - 99 mg/dL Final   Glucose reference range applies only to samples taken after fasting for at least 8 hours.   Glucose-Capillary 02/28/2022 136 (H)  70 - 99 mg/dL Final   Glucose reference range applies only to samples taken after fasting for at least 8 hours.   Glucose-Capillary 02/28/2022 173 (H)  70 - 99 mg/dL Final   Glucose reference range applies only to samples taken after fasting for at least 8 hours.   Glucose-Capillary 03/01/2022 134 (H)  70 - 99 mg/dL Final   Glucose reference range applies only to samples taken after fasting for at least 8 hours.   Glucose-Capillary 03/01/2022 116 (H)  70 - 99 mg/dL Final   Glucose reference range applies only to samples taken after fasting for at least 8 hours.   Glucose-Capillary 03/01/2022 227 (H)  70 - 99 mg/dL Final   Glucose reference range applies only to samples taken after fasting for at least 8 hours.   Glucose-Capillary 03/01/2022 157 (H)  70 - 99 mg/dL Final   Glucose reference range applies only to samples taken after fasting for at least 8 hours.   Glucose-Capillary 03/02/2022 99  70 - 99 mg/dL Final   Glucose reference range applies only to samples taken after fasting for at least 8 hours.   WBC 03/02/2022 6.0  4.0 - 10.5 K/uL Final   RBC 03/02/2022 4.15  3.87 - 5.11 MIL/uL Final   Hemoglobin 03/02/2022 9.7 (L)  12.0 - 15.0 g/dL Final   HCT 82/95/6213 31.5 (L)  36.0 - 46.0 % Final   MCV 03/02/2022 75.9 (L)  80.0 - 100.0 fL Final   MCH 03/02/2022 23.4 (L)  26.0 - 34.0 pg Final   MCHC 03/02/2022 30.8  30.0 - 36.0 g/dL Final   RDW 08/65/7846 16.4 (H)  11.5 - 15.5 % Final   Platelets 03/02/2022 395  150 - 400 K/uL Final   nRBC 03/02/2022 0.0  0.0 - 0.2 % Final   Neutrophils Relative % 03/02/2022 61  % Final   Neutro Abs 03/02/2022 3.6  1.7 - 7.7 K/uL Final   Lymphocytes Relative 03/02/2022 29  % Final    Lymphs Abs 03/02/2022 1.7  0.7 - 4.0 K/uL Final   Monocytes Relative 03/02/2022 8  % Final   Monocytes Absolute 03/02/2022 0.5  0.1 - 1.0 K/uL Final   Eosinophils Relative 03/02/2022 1  % Final   Eosinophils Absolute 03/02/2022 0.1  0.0 - 0.5 K/uL Final   Basophils Relative 03/02/2022 1  % Final   Basophils Absolute 03/02/2022 0.1  0.0 - 0.1 K/uL Final   Immature Granulocytes 03/02/2022 0  % Final   Abs Immature Granulocytes 03/02/2022 0.01  0.00 - 0.07 K/uL Final   Performed at [facility], [address] [place], [place] [address]   Glucose-Capillary 03/02/2022 171 (H)  70 - 99 mg/dL Final   Glucose reference range applies only to samples taken after fasting for at least 8 hours.   Glucose-Capillary 03/02/2022 108 (H)  70 - 99 mg/dL Final   Glucose reference range applies only to samples taken after fasting  for at least 8 hours.   Glucose-Capillary 03/02/2022 203 (H)  70 - 99 mg/dL Final   Glucose reference range applies only to samples taken after fasting for at least 8 hours.   Glucose-Capillary 03/03/2022 128 (H)  70 - 99 mg/dL Final   Glucose reference range applies only to samples taken after fasting for at least 8 hours.   Glucose-Capillary 03/03/2022 134 (H)  70 - 99 mg/dL Final   Glucose reference range applies only to samples taken after fasting for at least 8 hours.   Glucose-Capillary 03/03/2022 100 (H)  70 - 99 mg/dL Final   Glucose reference range applies only to samples taken after fasting for at least 8 hours.   Glucose-Capillary 03/03/2022 160 (H)  70 - 99 mg/dL Final   Glucose reference range applies only to samples taken after fasting for at least 8 hours.   Glucose-Capillary 03/04/2022 116 (H)  70 - 99 mg/dL Final   Glucose reference range applies only to samples taken after fasting for at least 8 hours.   Glucose-Capillary 03/04/2022 104 (H)  70 - 99 mg/dL Final   Glucose reference range applies only to samples taken after fasting for at least 8 hours.    Glucose-Capillary 03/04/2022 156 (H)  70 - 99 mg/dL Final   Glucose reference range applies only to samples taken after fasting for at least 8 hours.   Glucose-Capillary 03/04/2022 201 (H)  70 - 99 mg/dL Final   Glucose reference range applies only to samples taken after fasting for at least 8 hours.   Glucose-Capillary 03/05/2022 120 (H)  70 - 99 mg/dL Final   Glucose reference range applies only to samples taken after fasting for at least 8 hours.   Glucose-Capillary 03/05/2022 237 (H)  70 - 99 mg/dL Final   Glucose reference range applies only to samples taken after fasting for at least 8 hours.   Glucose-Capillary 03/05/2022 168 (H)  70 - 99 mg/dL Final   Glucose reference range applies only to samples taken after fasting for at least 8 hours.   Glucose-Capillary 03/05/2022 200 (H)  70 - 99 mg/dL Final   Glucose reference range applies only to samples taken after fasting for at least 8 hours.   Glucose-Capillary 03/06/2022 135 (H)  70 - 99 mg/dL Final   Glucose reference range applies only to samples taken after fasting for at least 8 hours.   Glucose-Capillary 03/06/2022 135 (H)  70 - 99 mg/dL Final   Glucose reference range applies only to samples taken after fasting for at least 8 hours.   Glucose-Capillary 03/06/2022 235 (H)  70 - 99 mg/dL Final   Glucose reference range applies only to samples taken after fasting for at least 8 hours.   Glucose-Capillary 03/07/2022 141 (H)  70 - 99 mg/dL Final   Glucose reference range applies only to samples taken after fasting for at least 8 hours.   Glucose-Capillary 03/07/2022 157 (H)  70 - 99 mg/dL Final   Glucose reference range applies only to samples taken after fasting for at least 8 hours.  Admission on 02/17/2022, Discharged on 02/18/2022  Component Date Value Ref Range Status   Sodium 02/17/2022 136  135 - 145 mmol/L Final   Potassium 02/17/2022 3.9  3.5 - 5.1 mmol/L Final   Chloride 02/17/2022 101  98 - 111 mmol/L Final   CO2  02/17/2022 25  22 - 32 mmol/L Final   Glucose, Bld 02/17/2022 113 (H)  70 - 99 mg/dL Final  Glucose reference range applies only to samples taken after fasting for at least 8 hours.   BUN 02/17/2022 9  6 - 20 mg/dL Final   Creatinine, Ser 02/17/2022 0.50  0.44 - 1.00 mg/dL Final   Calcium 02/17/2022 8.9  8.9 - 10.3 mg/dL Final   GFR, Estimated 02/17/2022 >60  >60 mL/min Final   Comment: (NOTE) Calculated using the CKD-EPI Creatinine Equation (2021)    Anion gap 02/17/2022 10  5 - 15 Final   Performed at [facility], [place] [address] [place], [place] [address]   WBC 02/17/2022 6.5  4.0 - 10.5 K/uL Final   RBC 02/17/2022 4.24  3.87 - 5.11 MIL/uL Final   Hemoglobin 02/17/2022 10.2 (L)  12.0 - 15.0 g/dL Final   HCT 02/17/2022 33.2 (L)  36.0 - 46.0 % Final   MCV 02/17/2022 78.3 (L)  80.0 - 100.0 fL Final   MCH 02/17/2022 24.1 (L)  26.0 - 34.0 pg Final   MCHC 02/17/2022 30.7  30.0 - 36.0 g/dL Final   RDW 02/17/2022 17.2 (H)  11.5 - 15.5 % Final   Platelets 02/17/2022 390  150 - 400 K/uL Final   nRBC 02/17/2022 0.0  0.0 - 0.2 % Final   Performed at [facility], [place] [address] [place], [place] [address]   Troponin I (High Sensitivity) 02/17/2022 4  <18 ng/L Final   Comment: (NOTE) Elevated high sensitivity troponin I (hsTnI) values and significant  changes across serial measurements may suggest ACS but many other  chronic and acute conditions are known to elevate hsTnI results.  Refer to the "Links" section for chest pain algorithms and additional  guidance. Performed at [facility], [place] [address], [place], [place] [address]    Valproic Acid Lvl 02/17/2022 17 (L)  50.0 - 100.0 ug/mL Final   Performed at [place] [address] [place], [place] [address]   B Natriuretic Peptide 02/17/2022 30.9  0.0 - 100.0 pg/mL Final   Performed at [place] [address], [place], [place] [address]   Troponin I (High Sensitivity) 02/18/2022 4  <18 ng/L Final    Comment: (NOTE) Elevated high sensitivity troponin I (hsTnI) values and significant  changes across serial measurements may suggest ACS but many other  chronic and acute conditions are known to elevate hsTnI results.  Refer to the "Links" section for chest pain algorithms and additional  guidance. Performed at [facility], [place] [address], [place], [place] [address]    D-Dimer, Quant 02/18/2022 0.29  0.00 - 0.50 ug/mL-FEU Final   Comment: (NOTE) At the manufacturer cut-off value of 0.5 g/mL FEU, this assay has a negative predictive value of 95-100%.This assay is intended for use in conjunction with a clinical pretest probability (PTP) assessment model to exclude pulmonary embolism (PE) and deep venous thrombosis (DVT) in outpatients suspected of PE or DVT. Results should be correlated with clinical presentation. Performed at [facility], [address], [place],  [address]   Admission on 02/09/2022, Discharged on 02/17/2022  Component Date Value Ref Range Status   Glucose-Capillary 02/09/2022 118 (H)  70 - 99 mg/dL Final   Glucose reference range applies only to samples taken after fasting for at least 8 hours.   Glucose-Capillary 02/10/2022 148 (H)  70 - 99 mg/dL Final   Glucose reference range applies only to samples taken after fasting for at least 8 hours.   Glucose-Capillary 02/10/2022 174 (H)  70 - 99 mg/dL Final   Glucose reference range applies only to samples taken after fasting for at least 8 hours.   Glucose-Capillary 02/10/2022 128 (H)  70 - 99 mg/dL Final   Glucose reference range applies only to samples taken after fasting for at least 8 hours.   Glucose-Capillary 02/10/2022 182 (H)  70 - 99 mg/dL Final   Glucose reference range applies only to samples taken after fasting for at least 8 hours.   Glucose-Capillary 02/11/2022 158 (H)  70 - 99 mg/dL Final   Glucose reference range applies only to samples taken after fasting for at least 8 hours.    Glucose-Capillary 02/11/2022 159 (H)  70 - 99 mg/dL Final   Glucose reference range applies only to samples taken after fasting for at least 8 hours.   Glucose-Capillary 02/11/2022 153 (H)  70 - 99 mg/dL Final   Glucose reference range applies only to samples taken after fasting for at least 8 hours.   Glucose-Capillary 02/11/2022 159 (H)  70 - 99 mg/dL Final   Glucose reference range applies only to samples taken after fasting for at least 8 hours.   Glucose-Capillary 02/11/2022 153 (H)  70 - 99 mg/dL Final   Glucose reference range applies only to samples taken after fasting for at least 8 hours.   Glucose-Capillary 02/11/2022 109 (H)  70 - 99 mg/dL Final   Glucose reference range applies only to samples taken after fasting for at least 8 hours.   Glucose-Capillary 02/11/2022 213 (H)  70 - 99 mg/dL Final   Glucose reference range applies only to samples taken after fasting for at least 8 hours.   Glucose-Capillary 02/11/2022 229 (H)  70 - 99 mg/dL Final   Glucose reference range applies only to samples taken after fasting for at least 8 hours.   Glucose-Capillary 02/12/2022 128 (H)  70 - 99 mg/dL Final   Glucose reference range applies only to samples taken after fasting for at least 8 hours.   Glucose-Capillary 02/12/2022 149 (H)  70 - 99 mg/dL Final   Glucose reference range applies only to samples taken after fasting for at least 8 hours.   Color, Urine 02/12/2022 YELLOW  YELLOW Final   APPearance 02/12/2022 CLEAR  CLEAR Final   Specific Gravity, Urine 02/12/2022 1.015  1.005 - 1.030 Final   pH 02/12/2022 5.0  5.0 - 8.0 Final   Glucose, UA 02/12/2022 NEGATIVE  NEGATIVE mg/dL Final   Hgb urine dipstick 02/12/2022 NEGATIVE  NEGATIVE Final   Bilirubin Urine 02/12/2022 NEGATIVE  NEGATIVE Final   Ketones, ur 02/12/2022 NEGATIVE  NEGATIVE mg/dL Final   Protein, ur 16/11/9602 NEGATIVE  NEGATIVE mg/dL Final   Nitrite 54/10/8117 NEGATIVE  NEGATIVE Final   Leukocytes,Ua 02/12/2022 NEGATIVE   NEGATIVE Final   Performed at [facility], [address], [place], [place] [address]   Specimen Description 02/12/2022 URINE, CLEAN CATCH   Final   Special Requests 02/12/2022    Final                   Value:NONE Performed at [facility], [address], [place], [place] [address]    Culture 02/12/2022 10,000 COLONIES/mL PROTEUS MIRABILIS (A)   Final   Report Status 02/12/2022 02/15/2022 FINAL   Final   Organism ID, Bacteria 02/12/2022 PROTEUS MIRABILIS (A)   Final   Glucose-Capillary 02/12/2022 128 (H)  70 - 99 mg/dL Final   Glucose reference range applies only to samples taken  after fasting for at least 8 hours.   Glucose-Capillary 02/12/2022 196 (H)  70 - 99 mg/dL Final   Glucose reference range applies only to samples taken after fasting for at least 8 hours.   Glucose-Capillary 02/13/2022 168 (H)  70 - 99 mg/dL Final   Glucose reference range applies only to samples taken after fasting for at least 8 hours.   Glucose-Capillary 02/13/2022 153 (H)  70 - 99 mg/dL Final   Glucose reference range applies only to samples taken after fasting for at least 8 hours.   Glucose-Capillary 02/13/2022 134 (H)  70 - 99 mg/dL Final   Glucose reference range applies only to samples taken after fasting for at least 8 hours.   Glucose-Capillary 02/13/2022 178 (H)  70 - 99 mg/dL Final   Glucose reference range applies only to samples taken after fasting for at least 8 hours.   Glucose-Capillary 02/14/2022 156 (H)  70 - 99 mg/dL Final   Glucose reference range applies only to samples taken after fasting for at least 8 hours.   Glucose-Capillary 02/14/2022 169 (H)  70 - 99 mg/dL Final   Glucose reference range applies only to samples taken after fasting for at least 8 hours.   Glucose-Capillary 02/14/2022 156 (H)  70 - 99 mg/dL Final   Glucose reference range applies only to samples taken after fasting for at least 8 hours.   Glucose-Capillary 02/14/2022 148 (H)  70 - 99 mg/dL Final    Glucose reference range applies only to samples taken after fasting for at least 8 hours.   Glucose-Capillary 02/15/2022 159 (H)  70 - 99 mg/dL Final   Glucose reference range applies only to samples taken after fasting for at least 8 hours.   Glucose-Capillary 02/15/2022 125 (H)  70 - 99 mg/dL Final   Glucose reference range applies only to samples taken after fasting for at least 8 hours.   Glucose-Capillary 02/15/2022 142 (H)  70 - 99 mg/dL Final   Glucose reference range applies only to samples taken after fasting for at least 8 hours.   Glucose-Capillary 02/15/2022 128 (H)  70 - 99 mg/dL Final   Glucose reference range applies only to samples taken after fasting for at least 8 hours.   Glucose-Capillary 02/16/2022 137 (H)  70 - 99 mg/dL Final   Glucose reference range applies only to samples taken after fasting for at least 8 hours.   WBC 02/16/2022 6.0  4.0 - 10.5 K/uL Final   RBC 02/16/2022 4.48  3.87 - 5.11 MIL/uL Final   Hemoglobin 02/16/2022 10.7 (L)  12.0 - 15.0 g/dL Final   HCT 16/11/9602 35.4 (L)  36.0 - 46.0 % Final   MCV 02/16/2022 79.0 (L)  80.0 - 100.0 fL Final   MCH 02/16/2022 23.9 (L)  26.0 - 34.0 pg Final   MCHC 02/16/2022 30.2  30.0 - 36.0 g/dL Final   RDW 54/10/8117 17.0 (H)  11.5 - 15.5 % Final   Platelets 02/16/2022 387  150 - 400 K/uL Final   nRBC 02/16/2022 0.0  0.0 - 0.2 % Final   Neutrophils Relative % 02/16/2022 54  % Final   Neutro Abs 02/16/2022 3.2  1.7 - 7.7 K/uL Final   Lymphocytes Relative 02/16/2022 35  % Final   Lymphs Abs 02/16/2022 2.1  0.7 - 4.0 K/uL Final   Monocytes Relative 02/16/2022 6  % Final   Monocytes Absolute 02/16/2022 0.4  0.1 - 1.0 K/uL Final   Eosinophils Relative 02/16/2022 2  %  Final   Eosinophils Absolute 02/16/2022 0.1  0.0 - 0.5 K/uL Final   Basophils Relative 02/16/2022 2  % Final   Basophils Absolute 02/16/2022 0.1  0.0 - 0.1 K/uL Final   Immature Granulocytes 02/16/2022 1  % Final   Abs Immature Granulocytes 02/16/2022 0.04   0.00 - 0.07 K/uL Final   Performed at [facility], [address] [place], [place] [address]   Glucose-Capillary 02/16/2022 131 (H)  70 - 99 mg/dL Final   Glucose reference range applies only to samples taken after fasting for at least 8 hours.   Glucose-Capillary 02/16/2022 123 (H)  70 - 99 mg/dL Final   Glucose reference range applies only to samples taken after fasting for at least 8 hours.   Glucose-Capillary 02/16/2022 195 (H)  70 - 99 mg/dL Final   Glucose reference range applies only to samples taken after fasting for at least 8 hours.   Glucose-Capillary 02/17/2022 148 (H)  70 - 99 mg/dL Final   Glucose reference range applies only to samples taken after fasting for at least 8 hours.  Admission on 02/08/2022, Discharged on 02/09/2022  Component Date Value Ref Range Status   B Natriuretic Peptide 02/08/2022 40.2  0.0 - 100.0 pg/mL Final   Performed at [facility], [address] [place], [place] [address]   Troponin I (High Sensitivity) 02/08/2022 4  <18 ng/L Final   Comment: (NOTE) Elevated high sensitivity troponin I (hsTnI) values and significant  changes across serial measurements may suggest ACS but many other  chronic and acute conditions are known to elevate hsTnI results.  Refer to the "Links" section for chest pain algorithms and additional  guidance. Performed at [facility], [address], [place], [place] [address]    WBC 02/08/2022 8.5  4.0 - 10.5 K/uL Final   RBC 02/08/2022 4.30  3.87 - 5.11 MIL/uL Final   Hemoglobin 02/08/2022 10.7 (L)  12.0 - 15.0 g/dL Final   HCT 91/47/8295 33.1 (L)  36.0 - 46.0 % Final   MCV 02/08/2022 77.0 (L)  80.0 - 100.0 fL Final   MCH 02/08/2022 24.9 (L)  26.0 - 34.0 pg Final   MCHC 02/08/2022 32.3  30.0 - 36.0 g/dL Final   RDW 62/13/0865 16.5 (H)  11.5 - 15.5 % Final   Platelets 02/08/2022 361  150 - 400 K/uL Final   nRBC 02/08/2022 0.0  0.0 - 0.2 % Final   Performed at [facility], [address],  [place], [place] [address]   Sodium 02/08/2022 131 (L)  135 - 145 mmol/L Final   Potassium 02/08/2022 4.4  3.5 - 5.1 mmol/L Final   Chloride 02/08/2022 96 (L)  98 - 111 mmol/L Final   CO2 02/08/2022 25  22 - 32 mmol/L Final   Glucose, Bld 02/08/2022 126 (H)  70 - 99 mg/dL Final   Glucose reference range applies only to samples taken after fasting for at least 8 hours.   BUN 02/08/2022 11  6 - 20 mg/dL Final   Creatinine, Ser 02/08/2022 0.52  0.44 - 1.00 mg/dL Final   Calcium 62/95/2841 9.4  8.9 - 10.3 mg/dL Final   Total Protein 32/44/0102 7.5  6.5 - 8.1 g/dL Final   Albumin 72/53/6644 3.6  3.5 - 5.0 g/dL Final   AST 03/47/4259 22  15 - 41 U/L Final   ALT 02/08/2022 20  0 - 44 U/L Final   Alkaline Phosphatase 02/08/2022 67  38 -  126 U/L Final   Total Bilirubin 02/08/2022 0.1 (L)  0.3 - 1.2 mg/dL Final   GFR, Estimated 02/08/2022 >60  >60 mL/min Final   Comment: (NOTE) Calculated using the CKD-EPI Creatinine Equation (2021)    Anion gap 02/08/2022 10  5 - 15 Final   Performed at [facility], [address] [place], [place] [address]   Troponin I (High Sensitivity) 02/08/2022 5  <18 ng/L Final   Comment: (NOTE) Elevated high sensitivity troponin I (hsTnI) values and significant  changes across serial measurements may suggest ACS but many other  chronic and acute conditions are known to elevate hsTnI results.  Refer to the "Links" section for chest pain algorithms and additional  guidance. Performed at [facility], [address] [place], [place] [address]    WBC 02/09/2022 7.3  4.0 - 10.5 K/uL Final   RBC 02/09/2022 4.70  3.87 - 5.11 MIL/uL Final   Hemoglobin 02/09/2022 11.4 (L)  12.0 - 15.0 g/dL Final   HCT 09/81/191412/01/2022 36.6  36.0 - 46.0 % Final   MCV 02/09/2022 77.9 (L)  80.0 - 100.0 fL Final   MCH 02/09/2022 24.3 (L)  26.0 - 34.0 pg Final   MCHC 02/09/2022 31.1  30.0 - 36.0 g/dL Final   RDW 78/29/562112/01/2022 16.6 (H)  11.5 - 15.5 % Final   Platelets 02/09/2022 403 (H)  150 -  400 K/uL Final   nRBC 02/09/2022 0.0  0.0 - 0.2 % Final   Performed at [facility], [address] [place], [place] [address]   Glucose-Capillary 02/08/2022 117 (H)  70 - 99 mg/dL Final   Glucose reference range applies only to samples taken after fasting for at least 8 hours.   Sodium 02/09/2022 138  135 - 145 mmol/L Final   Potassium 02/09/2022 3.7  3.5 - 5.1 mmol/L Final   Chloride 02/09/2022 103  98 - 111 mmol/L Final   CO2 02/09/2022 24  22 - 32 mmol/L Final   Glucose, Bld 02/09/2022 134 (H)  70 - 99 mg/dL Final   Glucose reference range applies only to samples taken after fasting for at least 8 hours.   BUN 02/09/2022 9  6 - 20 mg/dL Final   Creatinine, Ser 02/09/2022 0.44  0.44 - 1.00 mg/dL Final   Calcium 78/46/962912/01/2022 8.3 (L)  8.9 - 10.3 mg/dL Final   GFR, Estimated 02/09/2022 >60  >60 mL/min Final   Comment: (NOTE) Calculated using the CKD-EPI Creatinine Equation (2021)    Anion gap 02/09/2022 11  5 - 15 Final   Performed at [facility], [address] [place], [place] [address]   Iron 02/09/2022 20 (L)  28 - 170 ug/dL Final   TIBC 32/44/010212/01/2022 455 (H)  250 - 450 ug/dL Final   Saturation Ratios 02/09/2022 4 (L)  10.4 - 31.8 % Final   UIBC 02/09/2022 435  ug/dL Final   Performed at [facility], [address] [place], [place] [address]   Ferritin 02/09/2022 2 (L)  11 - 307 ng/mL Final   Performed at [facility], [address] [place], [place] [address]   Weight 02/09/2022 2,846.58  oz Final   Height 02/09/2022 62  in Final   BP 02/09/2022 143/80  mmHg Final   WBC 02/09/2022 8.1  4.0 - 10.5 K/uL Final   RBC 02/09/2022 4.38  3.87 - 5.11 MIL/uL Final   Hemoglobin 02/09/2022 10.9 (L)  12.0 - 15.0 g/dL  Final   HCT 02/09/2022 33.3 (L)  36.0 - 46.0 % Final   MCV 02/09/2022 76.0 (L)  80.0 - 100.0 fL Final   MCH 02/09/2022 24.9 (L)  26.0 - 34.0 pg Final   MCHC 02/09/2022 32.7  30.0 - 36.0 g/dL Final   RDW 16/11/9602 16.5 (H)  11.5 - 15.5 % Final    Platelets 02/09/2022 397  150 - 400 K/uL Final   nRBC 02/09/2022 0.0  0.0 - 0.2 % Final   Neutrophils Relative % 02/09/2022 66  % Final   Neutro Abs 02/09/2022 5.4  1.7 - 7.7 K/uL Final   Lymphocytes Relative 02/09/2022 24  % Final   Lymphs Abs 02/09/2022 1.9  0.7 - 4.0 K/uL Final   Monocytes Relative 02/09/2022 8  % Final   Monocytes Absolute 02/09/2022 0.6  0.1 - 1.0 K/uL Final   Eosinophils Relative 02/09/2022 1  % Final   Eosinophils Absolute 02/09/2022 0.1  0.0 - 0.5 K/uL Final   Basophils Relative 02/09/2022 1  % Final   Basophils Absolute 02/09/2022 0.1  0.0 - 0.1 K/uL Final   Immature Granulocytes 02/09/2022 0  % Final   Abs Immature Granulocytes 02/09/2022 0.03  0.00 - 0.07 K/uL Final   Performed at [facility], [address], [place], [place] [address]   Glucose-Capillary 02/09/2022 238 (H)  70 - 99 mg/dL Final   Glucose reference range applies only to samples taken after fasting for at least 8 hours.   Glucose-Capillary 02/09/2022 91  70 - 99 mg/dL Final   Glucose reference range applies only to samples taken after fasting for at least 8 hours.   CRP 02/09/2022 <0.5  <1.0 mg/dL Final   Performed at [facility], [address], [place], [place] [address]   Sed Rate 02/09/2022 15  0 - 22 mm/hr Final   Performed at [facility], [address] [place], [place] [address]   Glucose-Capillary 02/09/2022 137 (H)  70 - 99 mg/dL Final   Glucose reference range applies only to samples taken after fasting for at least 8 hours.  Admission on 01/24/2022, Discharged on 02/08/2022  Component Date Value Ref Range Status   Glucose-Capillary 01/24/2022 143 (H)  70 - 99 mg/dL Final   Glucose reference range applies only to samples taken after fasting for at least 8 hours.   Glucose-Capillary 01/24/2022 120 (H)  70 - 99 mg/dL Final   Glucose reference range applies only to samples taken after fasting for at least 8 hours.   Glucose-Capillary 01/25/2022 122 (H)  70 - 99  mg/dL Final   Glucose reference range applies only to samples taken after fasting for at least 8 hours.   Glucose-Capillary 01/25/2022 190 (H)  70 - 99 mg/dL Final   Glucose reference range applies only to samples taken after fasting for at least 8 hours.   Glucose-Capillary 01/25/2022 163 (H)  70 - 99 mg/dL Final   Glucose reference range applies only to samples taken after fasting for at least 8 hours.   WBC 01/26/2022 6.5  4.0 - 10.5 K/uL Final   RBC 01/26/2022 4.53  3.87 - 5.11 MIL/uL Final   Hemoglobin 01/26/2022 11.3 (L)  12.0 - 15.0 g/dL Final   HCT 62/13/0865 36.4  36.0 - 46.0 % Final   MCV 01/26/2022 80.4  80.0 - 100.0 fL Final   MCH 01/26/2022 24.9 (L)  26.0 - 34.0 pg Final   MCHC 01/26/2022 31.0  30.0 - 36.0  g/dL Final   RDW 86/57/8469 17.3 (H)  11.5 - 15.5 % Final   Platelets 01/26/2022 327  150 - 400 K/uL Final   nRBC 01/26/2022 0.0  0.0 - 0.2 % Final   Neutrophils Relative % 01/26/2022 60  % Final   Neutro Abs 01/26/2022 3.9  1.7 - 7.7 K/uL Final   Lymphocytes Relative 01/26/2022 31  % Final   Lymphs Abs 01/26/2022 2.0  0.7 - 4.0 K/uL Final   Monocytes Relative 01/26/2022 7  % Final   Monocytes Absolute 01/26/2022 0.5  0.1 - 1.0 K/uL Final   Eosinophils Relative 01/26/2022 1  % Final   Eosinophils Absolute 01/26/2022 0.1  0.0 - 0.5 K/uL Final   Basophils Relative 01/26/2022 1  % Final   Basophils Absolute 01/26/2022 0.1  0.0 - 0.1 K/uL Final   Immature Granulocytes 01/26/2022 0  % Final   Abs Immature Granulocytes 01/26/2022 0.02  0.00 - 0.07 K/uL Final   Performed at [facility], [address], [place], [place] [address]   Glucose-Capillary 01/26/2022 149 (H)  70 - 99 mg/dL Final   Glucose reference range applies only to samples taken after fasting for at least 8 hours.   Glucose-Capillary 01/26/2022 130 (H)  70 - 99 mg/dL Final   Glucose reference range applies only to samples taken after fasting for at least 8 hours.   Glucose-Capillary 01/26/2022 151 (H)  70  - 99 mg/dL Final   Glucose reference range applies only to samples taken after fasting for at least 8 hours.   Glucose-Capillary 01/26/2022 170 (H)  70 - 99 mg/dL Final   Glucose reference range applies only to samples taken after fasting for at least 8 hours.   Glucose-Capillary 01/27/2022 129 (H)  70 - 99 mg/dL Final   Glucose reference range applies only to samples taken after fasting for at least 8 hours.   Glucose-Capillary 01/27/2022 101 (H)  70 - 99 mg/dL Final   Glucose reference range applies only to samples taken after fasting for at least 8 hours.   Glucose-Capillary 01/27/2022 220 (H)  70 - 99 mg/dL Final   Glucose reference range applies only to samples taken after fasting for at least 8 hours.   Glucose-Capillary 01/27/2022 154 (H)  70 - 99 mg/dL Final   Glucose reference range applies only to samples taken after fasting for at least 8 hours.   Glucose-Capillary 01/27/2022 163 (H)  70 - 99 mg/dL Final   Glucose reference range applies only to samples taken after fasting for at least 8 hours.   Glucose-Capillary 01/28/2022 127 (H)  70 - 99 mg/dL Final   Glucose reference range applies only to samples taken after fasting for at least 8 hours.   Glucose-Capillary 01/28/2022 110 (H)  70 - 99 mg/dL Final   Glucose reference range applies only to samples taken after fasting for at least 8 hours.   Glucose-Capillary 01/28/2022 167 (H)  70 - 99 mg/dL Final   Glucose reference range applies only to samples taken after fasting for at least 8 hours.   Glucose-Capillary 01/29/2022 145 (H)  70 - 99 mg/dL Final   Glucose reference range applies only to samples taken after fasting for at least 8 hours.   Glucose-Capillary 01/29/2022 203 (H)  70 - 99 mg/dL Final   Glucose reference range applies only to samples taken after fasting for at least 8 hours.   Glucose-Capillary 01/29/2022 153 (H)  70 - 99 mg/dL Final  Glucose reference range applies only to samples taken after fasting for at least  8 hours.   Glucose-Capillary 01/29/2022 166 (H)  70 - 99 mg/dL Final   Glucose reference range applies only to samples taken after fasting for at least 8 hours.   Glucose-Capillary 01/30/2022 142 (H)  70 - 99 mg/dL Final   Glucose reference range applies only to samples taken after fasting for at least 8 hours.   Glucose-Capillary 01/30/2022 138 (H)  70 - 99 mg/dL Final   Glucose reference range applies only to samples taken after fasting for at least 8 hours.   Glucose-Capillary 01/30/2022 185 (H)  70 - 99 mg/dL Final   Glucose reference range applies only to samples taken after fasting for at least 8 hours.   Glucose-Capillary 01/30/2022 220 (H)  70 - 99 mg/dL Final   Glucose reference range applies only to samples taken after fasting for at least 8 hours.   Glucose-Capillary 01/31/2022 147 (H)  70 - 99 mg/dL Final   Glucose reference range applies only to samples taken after fasting for at least 8 hours.   Glucose-Capillary 01/31/2022 131 (H)  70 - 99 mg/dL Final   Glucose reference range applies only to samples taken after fasting for at least 8 hours.   Glucose-Capillary 01/31/2022 169 (H)  70 - 99 mg/dL Final   Glucose reference range applies only to samples taken after fasting for at least 8 hours.   Glucose-Capillary 01/31/2022 144 (H)  70 - 99 mg/dL Final   Glucose reference range applies only to samples taken after fasting for at least 8 hours.   Comment 1 01/31/2022 RN AWARE   Final   Glucose-Capillary 02/01/2022 117 (H)  70 - 99 mg/dL Final   Glucose reference range applies only to samples taken after fasting for at least 8 hours.   Glucose-Capillary 02/01/2022 180 (H)  70 - 99 mg/dL Final   Glucose reference range applies only to samples taken after fasting for at least 8 hours.   Glucose-Capillary 02/01/2022 209 (H)  70 - 99 mg/dL Final   Glucose reference range applies only to samples taken after fasting for at least 8 hours.   Glucose-Capillary 02/02/2022 131 (H)  70 - 99  mg/dL Final   Glucose reference range applies only to samples taken after fasting for at least 8 hours.   WBC 02/02/2022 7.5  4.0 - 10.5 K/uL Final   RBC 02/02/2022 4.52  3.87 - 5.11 MIL/uL Final   Hemoglobin 02/02/2022 11.3 (L)  12.0 - 15.0 g/dL Final   HCT 16/10/960412/06/2021 35.0 (L)  36.0 - 46.0 % Final   MCV 02/02/2022 77.4 (L)  80.0 - 100.0 fL Final   MCH 02/02/2022 25.0 (L)  26.0 - 34.0 pg Final   MCHC 02/02/2022 32.3  30.0 - 36.0 g/dL Final   RDW 54/09/811912/06/2021 16.7 (H)  11.5 - 15.5 % Final   Platelets 02/02/2022 345  150 - 400 K/uL Final   nRBC 02/02/2022 0.0  0.0 - 0.2 % Final   Neutrophils Relative % 02/02/2022 63  % Final   Neutro Abs 02/02/2022 4.7  1.7 - 7.7 K/uL Final   Lymphocytes Relative 02/02/2022 28  % Final   Lymphs Abs 02/02/2022 2.1  0.7 - 4.0 K/uL Final   Monocytes Relative 02/02/2022 7  % Final   Monocytes Absolute 02/02/2022 0.5  0.1 - 1.0 K/uL Final   Eosinophils Relative 02/02/2022 1  % Final   Eosinophils Absolute 02/02/2022 0.1  0.0 -  0.5 K/uL Final   Basophils Relative 02/02/2022 1  % Final   Basophils Absolute 02/02/2022 0.1  0.0 - 0.1 K/uL Final   Immature Granulocytes 02/02/2022 0  % Final   Abs Immature Granulocytes 02/02/2022 0.03  0.00 - 0.07 K/uL Final   Performed at [facility], [address] [place], [place] [address]   Glucose-Capillary 02/02/2022 95  70 - 99 mg/dL Final   Glucose reference range applies only to samples taken after fasting for at least 8 hours.   Glucose-Capillary 02/02/2022 120 (H)  70 - 99 mg/dL Final   Glucose reference range applies only to samples taken after fasting for at least 8 hours.   Glucose-Capillary 02/02/2022 191 (H)  70 - 99 mg/dL Final   Glucose reference range applies only to samples taken after fasting for at least 8 hours.   Glucose-Capillary 02/03/2022 148 (H)  70 - 99 mg/dL Final   Glucose reference range applies only to samples taken after fasting for at least 8 hours.   Glucose-Capillary 02/03/2022 122 (H)   70 - 99 mg/dL Final   Glucose reference range applies only to samples taken after fasting for at least 8 hours.   Glucose-Capillary 02/03/2022 233 (H)  70 - 99 mg/dL Final   Glucose reference range applies only to samples taken after fasting for at least 8 hours.   Glucose-Capillary 02/04/2022 126 (H)  70 - 99 mg/dL Final   Glucose reference range applies only to samples taken after fasting for at least 8 hours.   Glucose-Capillary 02/04/2022 117 (H)  70 - 99 mg/dL Final   Glucose reference range applies only to samples taken after fasting for at least 8 hours.   Glucose-Capillary 02/04/2022 135 (H)  70 - 99 mg/dL Final   Glucose reference range applies only to samples taken after fasting for at least 8 hours.   Glucose-Capillary 02/04/2022 197 (H)  70 - 99 mg/dL Final   Glucose reference range applies only to samples taken after fasting for at least 8 hours.   Glucose-Capillary 02/05/2022 170 (H)  70 - 99 mg/dL Final   Glucose reference range applies only to samples taken after fasting for at least 8 hours.   Glucose-Capillary 02/05/2022 107 (H)  70 - 99 mg/dL Final   Glucose reference range applies only to samples taken after fasting for at least 8 hours.   Glucose-Capillary 02/05/2022 184 (H)  70 - 99 mg/dL Final   Glucose reference range applies only to samples taken after fasting for at least 8 hours.   Glucose-Capillary 02/05/2022 256 (H)  70 - 99 mg/dL Final   Glucose reference range applies only to samples taken after fasting for at least 8 hours.   Glucose-Capillary 02/06/2022 144 (H)  70 - 99 mg/dL Final   Glucose reference range applies only to samples taken after fasting for at least 8 hours.   Glucose-Capillary 02/06/2022 190 (H)  70 - 99 mg/dL Final   Glucose reference range applies only to samples taken after fasting for at least 8 hours.   Glucose-Capillary 02/06/2022 92  70 - 99 mg/dL Final   Glucose reference range applies only to samples taken after fasting for at least 8  hours.   Trichomonas 02/06/2022 Negative   Final   Bacterial Vaginitis-Urine 02/06/2022 Negative   Final   Molecular Comment 02/06/2022 For tests bacteria and/or candida, this specimen does not meet the   Final   Molecular Comment 02/06/2022 strict criteria set by the  FDA. The result interpretation should be   Final   Molecular Comment 02/06/2022 considered in conjunction with the patient's clinical history.   Final   Comment 02/06/2022 Normal Reference Range Trichomonas - Negative   Final   Glucose-Capillary 02/06/2022 197 (H)  70 - 99 mg/dL Final   Glucose reference range applies only to samples taken after fasting for at least 8 hours.   Glucose-Capillary 02/07/2022 129 (H)  70 - 99 mg/dL Final   Glucose reference range applies only to samples taken after fasting for at least 8 hours.   Glucose-Capillary 02/07/2022 207 (H)  70 - 99 mg/dL Final   Glucose reference range applies only to samples taken after fasting for at least 8 hours.   Glucose-Capillary 02/07/2022 153 (H)  70 - 99 mg/dL Final   Glucose reference range applies only to samples taken after fasting for at least 8 hours.   Glucose-Capillary 02/08/2022 129 (H)  70 - 99 mg/dL Final   Glucose reference range applies only to samples taken after fasting for at least 8 hours.   Glucose-Capillary 02/08/2022 107 (H)  70 - 99 mg/dL Final   Glucose reference range applies only to samples taken after fasting for at least 8 hours.  Admission on 01/23/2022, Discharged on 01/24/2022  Component Date Value Ref Range Status   Sodium 01/23/2022 140  135 - 145 mmol/L Final   Potassium 01/23/2022 4.4  3.5 - 5.1 mmol/L Final   Chloride 01/23/2022 101  98 - 111 mmol/L Final   CO2 01/23/2022 25  22 - 32 mmol/L Final   Glucose, Bld 01/23/2022 198 (H)  70 - 99 mg/dL Final   Glucose reference range applies only to samples taken after fasting for at least 8 hours.   BUN 01/23/2022 13  6 - 20 mg/dL Final   Creatinine, Ser 01/23/2022 0.62  0.44 - 1.00  mg/dL Final   Calcium 16/11/9602 9.3  8.9 - 10.3 mg/dL Final   GFR, Estimated 01/23/2022 >60  >60 mL/min Final   Comment: (NOTE) Calculated using the CKD-EPI Creatinine Equation (2021)    Anion gap 01/23/2022 14  5 - 15 Final   Performed at [facility], [address] [place], [place] [address]   WBC 01/23/2022 5.8  4.0 - 10.5 K/uL Final   RBC 01/23/2022 4.63  3.87 - 5.11 MIL/uL Final   Hemoglobin 01/23/2022 11.7 (L)  12.0 - 15.0 g/dL Final   HCT 11/91/4782 37.4  36.0 - 46.0 % Final   MCV 01/23/2022 80.8  80.0 - 100.0 fL Final   MCH 01/23/2022 25.3 (L)  26.0 - 34.0 pg Final   MCHC 01/23/2022 31.3  30.0 - 36.0 g/dL Final   RDW 95/62/1308 17.9 (H)  11.5 - 15.5 % Final   Platelets 01/23/2022 333  150 - 400 K/uL Final   nRBC 01/23/2022 0.0  0.0 - 0.2 % Final   Performed at [facility], [address], [place], [place] [address]   Troponin I (High Sensitivity) 01/23/2022 6  <18 ng/L Final   Comment: (NOTE) Elevated high sensitivity troponin I (hsTnI) values and significant  changes across serial measurements may suggest ACS but many other  chronic and acute conditions are known to elevate hsTnI results.  Refer to the "Links" section for chest pain algorithms and additional  guidance. Performed at [facility], [address] [place], [place] [address]    Troponin I (High Sensitivity) 01/23/2022 5  <18 ng/L Final  Comment: (NOTE) Elevated high sensitivity troponin I (hsTnI) values and significant  changes across serial measurements may suggest ACS but many other  chronic and acute conditions are known to elevate hsTnI results.  Refer to the "Links" section for chest pain algorithms and additional  guidance. Performed at [facility], [address] [place], [place] [address]    SARS Coronavirus 2 by RT PCR 01/23/2022 NEGATIVE  NEGATIVE Final   Comment: (NOTE) SARS-CoV-2 target nucleic acids are NOT DETECTED.  The SARS-CoV-2 RNA is generally  detectable in upper and lower respiratory specimens during the acute phase of infection. The lowest concentration of SARS-CoV-2 viral copies this assay can detect is 250 copies / mL. A negative result does not preclude SARS-CoV-2 infection and should not be used as the sole basis for treatment or other patient management decisions.  A negative result may occur with improper specimen collection / handling, submission of specimen other than nasopharyngeal swab, presence of viral mutation(s) within the areas targeted by this assay, and inadequate number of viral copies (<250 copies / mL). A negative result must be combined with clinical observations, patient history, and epidemiological information.  Fact Sheet for Patients:   RoadLapTop.co.za  Fact Sheet for Healthcare Providers: http://kim-miller.com/  This test is not yet approved or                           cleared by the [place] FDA and has been authorized for detection and/or diagnosis of SARS-CoV-2 by FDA under an Emergency Use Authorization (EUA).  This EUA will remain in effect (meaning this test can be used) for the duration of the COVID-19 declaration under Section 564(b)(1) of the Act, 21 U.S.C. section 360bbb-3(b)(1), unless the authorization is terminated or revoked sooner.  Performed at [facility], [address], [place], [place] [address]    B Natriuretic Peptide 01/23/2022 41.3  0.0 - 100.0 pg/mL Final   Performed at [facility], [address] [place], [place] [address]   Group A Strep by PCR 01/23/2022 NOT DETECTED  NOT DETECTED Final   Performed at [facility], [address], [place], [place] [address]   Color, Urine 01/23/2022 YELLOW  YELLOW Final   APPearance 01/23/2022 CLEAR  CLEAR Final   Specific Gravity, Urine 01/23/2022 1.018  1.005 - 1.030 Final   pH 01/23/2022 7.0  5.0 - 8.0 Final   Glucose, UA 01/23/2022 NEGATIVE  NEGATIVE  mg/dL Final   Hgb urine dipstick 01/23/2022 NEGATIVE  NEGATIVE Final   Bilirubin Urine 01/23/2022 NEGATIVE  NEGATIVE Final   Ketones, ur 01/23/2022 NEGATIVE  NEGATIVE mg/dL Final   Protein, ur 29/56/2130 NEGATIVE  NEGATIVE mg/dL Final   Nitrite 86/57/8469 NEGATIVE  NEGATIVE Final   Leukocytes,Ua 01/23/2022 TRACE (A)  NEGATIVE Final   RBC / HPF 01/23/2022 0-5  0 - 5 RBC/hpf Final   WBC, UA 01/23/2022 0-5  0 - 5 WBC/hpf Final   Bacteria, UA 01/23/2022 NONE SEEN  NONE SEEN Final   Squamous Epithelial / HPF 01/23/2022 0-5  0 - 5 Final   Mucus 01/23/2022 PRESENT   Final   Performed at [facility], [address] [place], [place] [address]   SARS Coronavirus 2 by RT PCR 01/23/2022 NEGATIVE  NEGATIVE Final   Comment: (NOTE) SARS-CoV-2 target nucleic acids are NOT DETECTED.  The SARS-CoV-2 RNA is generally detectable in upper respiratory specimens during the acute phase of  infection. The lowest concentration of SARS-CoV-2 viral copies this assay can detect is 138 copies/mL. A negative result does not preclude SARS-Cov-2 infection and should not be used as the sole basis for treatment or other patient management decisions. A negative result may occur with  improper specimen collection/handling, submission of specimen other than nasopharyngeal swab, presence of viral mutation(s) within the areas targeted by this assay, and inadequate number of viral copies(<138 copies/mL). A negative result must be combined with clinical observations, patient history, and epidemiological information. The expected result is Negative.  Fact Sheet for Patients:  BloggerCourse.com  Fact Sheet for Healthcare Providers:  SeriousBroker.it  This test is no                          t yet approved or cleared by the [place] FDA and  has been authorized for detection and/or diagnosis of SARS-CoV-2 by FDA under an Emergency Use Authorization (EUA). This  EUA will remain  in effect (meaning this test can be used) for the duration of the COVID-19 declaration under Section 564(b)(1) of the Act, 21 U.S.C.section 360bbb-3(b)(1), unless the authorization is terminated  or revoked sooner.       Influenza A by PCR 01/23/2022 NEGATIVE  NEGATIVE Final   Influenza B by PCR 01/23/2022 NEGATIVE  NEGATIVE Final   Comment: (NOTE) The Xpert Xpress SARS-CoV-2/FLU/RSV plus assay is intended as an aid in the diagnosis of influenza from Nasopharyngeal swab specimens and should not be used as a sole basis for treatment. Nasal washings and aspirates are unacceptable for Xpert Xpress SARS-CoV-2/FLU/RSV testing.  Fact Sheet for Patients: BloggerCourse.com  Fact Sheet for Healthcare Providers: SeriousBroker.it  This test is not yet approved or cleared by the [place] FDA and has been authorized for detection and/or diagnosis of SARS-CoV-2 by FDA under an Emergency Use Authorization (EUA). This EUA will remain in effect (meaning this test can be used) for the duration of the COVID-19 declaration under Section 564(b)(1) of the Act, 21 U.S.C. section 360bbb-3(b)(1), unless the authorization is terminated or revoked.  Performed at [facility], [address], [place], [place] [address]    WBC 01/24/2022 6.1  4.0 - 10.5 K/uL Final   RBC 01/24/2022 4.60  3.87 - 5.11 MIL/uL Final   Hemoglobin 01/24/2022 11.7 (L)  12.0 - 15.0 g/dL Final   HCT 29/56/2130 37.0  36.0 - 46.0 % Final   MCV 01/24/2022 80.4  80.0 - 100.0 fL Final   MCH 01/24/2022 25.4 (L)  26.0 - 34.0 pg Final   MCHC 01/24/2022 31.6  30.0 - 36.0 g/dL Final   RDW 86/57/8469 17.6 (H)  11.5 - 15.5 % Final   Platelets 01/24/2022 334  150 - 400 K/uL Final   nRBC 01/24/2022 0.0  0.0 - 0.2 % Final   Neutrophils Relative % 01/24/2022 53  % Final   Neutro Abs 01/24/2022 3.3  1.7 - 7.7 K/uL Final   Lymphocytes Relative 01/24/2022 33  % Final    Lymphs Abs 01/24/2022 2.0  0.7 - 4.0 K/uL Final   Monocytes Relative 01/24/2022 11  % Final   Monocytes Absolute 01/24/2022 0.7  0.1 - 1.0 K/uL Final   Eosinophils Relative 01/24/2022 2  % Final   Eosinophils Absolute 01/24/2022 0.1  0.0 - 0.5 K/uL Final   Basophils Relative 01/24/2022 1  % Final   Basophils Absolute 01/24/2022 0.1  0.0 - 0.1 K/uL Final   Immature Granulocytes 01/24/2022 0  %  Final   Abs Immature Granulocytes 01/24/2022 0.02  0.00 - 0.07 K/uL Final   Performed at [facility], [address], [place], [place] [address]   Glucose-Capillary 01/24/2022 110 (H)  70 - 99 mg/dL Final   Glucose reference range applies only to samples taken after fasting for at least 8 hours.  No results displayed because visit has over 200 results.    Admission on 11/22/2021, Discharged on 11/22/2021  Component Date Value Ref Range Status   Glucose-Capillary 11/22/2021 169 (H)  70 - 99 mg/dL Final   Glucose reference range applies only to samples taken after fasting for at least 8 hours.  No results displayed because visit has over 200 results.    Admission on 10/04/2021, Discharged on 10/22/2021  Component Date Value Ref Range Status   SARS Coronavirus 2 by RT PCR 10/04/2021 NEGATIVE  NEGATIVE Final   Comment: (NOTE) SARS-CoV-2 target nucleic acids are NOT DETECTED.  The SARS-CoV-2 RNA is generally detectable in upper respiratory specimens during the acute phase of infection. The lowest concentration of SARS-CoV-2 viral copies this assay can detect is 138 copies/mL. A negative result does not preclude SARS-Cov-2 infection and should not be used as the sole basis for treatment or other patient management decisions. A negative result may occur with  improper specimen collection/handling, submission of specimen other than nasopharyngeal swab, presence of viral mutation(s) within the areas targeted by this assay, and inadequate number of viral copies(<138 copies/mL). A negative  result must be combined with clinical observations, patient history, and epidemiological information. The expected result is Negative.  Fact Sheet for Patients:  BloggerCourse.com  Fact Sheet for Healthcare Providers:  SeriousBroker.it  This test is no                          t yet approved or cleared by the [place] FDA and  has been authorized for detection and/or diagnosis of SARS-CoV-2 by FDA under an Emergency Use Authorization (EUA). This EUA will remain  in effect (meaning this test can be used) for the duration of the COVID-19 declaration under Section 564(b)(1) of the Act, 21 U.S.C.section 360bbb-3(b)(1), unless the authorization is terminated  or revoked sooner.       Influenza A by PCR 10/04/2021 NEGATIVE  NEGATIVE Final   Influenza B by PCR 10/04/2021 NEGATIVE  NEGATIVE Final   Comment: (NOTE) The Xpert Xpress SARS-CoV-2/FLU/RSV plus assay is intended as an aid in the diagnosis of influenza from Nasopharyngeal swab specimens and should not be used as a sole basis for treatment. Nasal washings and aspirates are unacceptable for Xpert Xpress SARS-CoV-2/FLU/RSV testing.  Fact Sheet for Patients: BloggerCourse.com  Fact Sheet for Healthcare Providers: SeriousBroker.it  This test is not yet approved or cleared by the [place] FDA and has been authorized for detection and/or diagnosis of SARS-CoV-2 by FDA under an Emergency Use Authorization (EUA). This EUA will remain in effect (meaning this test can be used) for the duration of the COVID-19 declaration under Section 564(b)(1) of the Act, 21 U.S.C. section 360bbb-3(b)(1), unless the authorization is terminated or revoked.  Performed at [facility], [address], [place], [place] [address]    WBC 10/04/2021 8.4  4.0 - 10.5 K/uL Final   RBC 10/04/2021 4.42  3.87 - 5.11 MIL/uL Final   Hemoglobin  10/04/2021 11.6 (L)  12.0 - 15.0 g/dL Final   HCT 82/95/6213 35.7 (L)  36.0 - 46.0 % Final  MCV 10/04/2021 80.8  80.0 - 100.0 fL Final   MCH 10/04/2021 26.2  26.0 - 34.0 pg Final   MCHC 10/04/2021 32.5  30.0 - 36.0 g/dL Final   RDW 16/10/960408/07/2021 16.0 (H)  11.5 - 15.5 % Final   Platelets 10/04/2021 372  150 - 400 K/uL Final   nRBC 10/04/2021 0.0  0.0 - 0.2 % Final   Neutrophils Relative % 10/04/2021 68  % Final   Neutro Abs 10/04/2021 5.7  1.7 - 7.7 K/uL Final   Lymphocytes Relative 10/04/2021 22  % Final   Lymphs Abs 10/04/2021 1.8  0.7 - 4.0 K/uL Final   Monocytes Relative 10/04/2021 8  % Final   Monocytes Absolute 10/04/2021 0.7  0.1 - 1.0 K/uL Final   Eosinophils Relative 10/04/2021 1  % Final   Eosinophils Absolute 10/04/2021 0.1  0.0 - 0.5 K/uL Final   Basophils Relative 10/04/2021 1  % Final   Basophils Absolute 10/04/2021 0.1  0.0 - 0.1 K/uL Final   Immature Granulocytes 10/04/2021 0  % Final   Abs Immature Granulocytes 10/04/2021 0.03  0.00 - 0.07 K/uL Final   Performed at [facility], [address] [place], [place] [address]   Sodium 10/04/2021 136  135 - 145 mmol/L Final   Potassium 10/04/2021 4.2  3.5 - 5.1 mmol/L Final   Chloride 10/04/2021 104  98 - 111 mmol/L Final   CO2 10/04/2021 25  22 - 32 mmol/L Final   Glucose, Bld 10/04/2021 100 (H)  70 - 99 mg/dL Final   Glucose reference range applies only to samples taken after fasting for at least 8 hours.   BUN 10/04/2021 9  6 - 20 mg/dL Final   Creatinine, Ser 10/04/2021 0.52  0.44 - 1.00 mg/dL Final   Calcium 11/91/478208/07/2021 9.0  8.9 - 10.3 mg/dL Final   Total Protein 95/62/130808/07/2021 7.0  6.5 - 8.1 g/dL Final   Albumin 65/78/469608/07/2021 3.2 (L)  3.5 - 5.0 g/dL Final   AST 29/52/841308/07/2021 13 (L)  15 - 41 U/L Final   ALT 10/04/2021 10  0 - 44 U/L Final   Alkaline Phosphatase 10/04/2021 61  38 - 126 U/L Final   Total Bilirubin 10/04/2021 0.3  0.3 - 1.2 mg/dL Final   GFR, Estimated 10/04/2021 >60  >60 mL/min Final   Comment:  (NOTE) Calculated using the CKD-EPI Creatinine Equation (2021)    Anion gap 10/04/2021 7  5 - 15 Final   Performed at [facility], [address] [place], [place] [address]   Hgb A1c MFr Bld 10/04/2021 7.0 (H)  4.8 - 5.6 % Final   Comment: (NOTE) Pre diabetes:          5.7%-6.4%  Diabetes:              >6.4%  Glycemic control for   <7.0% adults with diabetes    Mean Plasma Glucose 10/04/2021 154.2  mg/dL Final   Performed at [facility], [address] [place], [place] [address]   Cholesterol 10/04/2021 178  0 - 200 mg/dL Final   Triglycerides 36/64/403408/07/2021 155 (H)  <150 mg/dL Final   HDL 74/25/956308/07/2021 52  >40 mg/dL Final   Total CHOL/HDL Ratio 10/04/2021 3.4  RATIO Final   VLDL 10/04/2021 31  0 - 40 mg/dL Final   LDL Cholesterol 10/04/2021 95  0 - 99 mg/dL Final   Comment:        Total Cholesterol/HDL:CHD Risk Coronary Heart Disease Risk Table  Men   Women  1/2 Average Risk   3.4   3.3  Average Risk       5.0   4.4  2 X Average Risk   9.6   7.1  3 X Average Risk  23.4   11.0        Use the calculated Patient Ratio above and the CHD Risk Table to determine the patient's CHD Risk.        ATP III CLASSIFICATION (LDL):  <100     mg/dL   Optimal  161-096100-129  mg/dL   Near or Above                    Optimal  130-159  mg/dL   Borderline  045-409160-189  mg/dL   High  >811>190     mg/dL   Very High Performed at [facility], [address] [place], [place] [address]    POC Amphetamine UR 10/04/2021 None Detected  NONE DETECTED (Cut Off Level 1000 ng/mL) Final   POC Secobarbital (BAR) 10/04/2021 None Detected  NONE DETECTED (Cut Off Level 300 ng/mL) Final   POC Buprenorphine (BUP) 10/04/2021 None Detected  NONE DETECTED (Cut Off Level 10 ng/mL) Final   POC Oxazepam (BZO) 10/04/2021 None Detected  NONE DETECTED (Cut Off Level 300 ng/mL) Final   POC Cocaine UR 10/04/2021 None Detected  NONE DETECTED (Cut Off Level 300 ng/mL) Final   POC Methamphetamine UR  10/04/2021 None Detected  NONE DETECTED (Cut Off Level 1000 ng/mL) Final   POC Morphine 10/04/2021 None Detected  NONE DETECTED (Cut Off Level 300 ng/mL) Final   POC Methadone UR 10/04/2021 None Detected  NONE DETECTED (Cut Off Level 300 ng/mL) Final   POC Oxycodone UR 10/04/2021 Positive (A)  NONE DETECTED (Cut Off Level 100 ng/mL) Final   POC Marijuana UR 10/04/2021 None Detected  NONE DETECTED (Cut Off Level 50 ng/mL) Final   SARSCOV2ONAVIRUS 2 AG 10/04/2021 NEGATIVE  NEGATIVE Final   Comment: (NOTE) SARS-CoV-2 antigen NOT DETECTED.   Negative results are presumptive.  Negative results do not preclude SARS-CoV-2 infection and should not be used as the sole basis for treatment or other patient management decisions, including infection  control decisions, particularly in the presence of clinical signs and  symptoms consistent with COVID-19, or in those who have been in contact with the virus.  Negative results must be combined with clinical observations, patient history, and epidemiological information. The expected result is Negative.  Fact Sheet for Patients: https://www.jennings-kim.com/https://www.fda.gov/media/141569/download  Fact Sheet for Healthcare Providers: https://alexander-rogers.biz/https://www.fda.gov/media/141568/download  This test is not yet approved or cleared by the [place] FDA and  has been authorized for detection and/or diagnosis of SARS-CoV-2 by FDA under an Emergency Use Authorization (EUA).  This EUA will remain in effect (meaning this test can be used) for the duration of  the COV                          ID-19 declaration under Section 564(b)(1) of the Act, 21 U.S.C. section 360bbb-3(b)(1), unless the authorization is terminated or revoked sooner.     Valproic Acid Lvl 10/04/2021 51  50.0 - 100.0 ug/mL Final   Performed at [facility], [address] [place], [place] [address]   Valproic Acid Lvl 10/08/2021 57  50.0 - 100.0 ug/mL Final   Performed at [facility], [address]  [place], [place] [address]   Glucose-Capillary  10/09/2021 123 (H)  70 - 99 mg/dL Final   Glucose reference range applies only to samples taken after fasting for at least 8 hours.  There may be more visits with results that are not included.    Blood Alcohol level:  Lab Results  Component Value Date   ETH <10 11/23/2021   ETH <10 11/08/2021    Metabolic Disorder Labs: Lab Results  Component Value Date   HGBA1C 6.7 (H) 12/28/2021   MPG 145.59 12/28/2021   MPG 154.2 10/04/2021   Lab Results  Component Value Date   PROLACTIN 3.8 (L) 11/23/2021   Lab Results  Component Value Date   CHOL 171 11/23/2021   TRIG 125 11/23/2021   HDL 65 11/23/2021   CHOLHDL 2.6 11/23/2021   VLDL 25 11/23/2021   LDLCALC 81 11/23/2021   LDLCALC 95 10/04/2021    Therapeutic Lab Levels: No results found for: "LITHIUM" Lab Results  Component Value Date   VALPROATE 17 (L) 02/17/2022   VALPROATE 33 (L) 01/18/2022   No results found for: "CBMZ"  Physical Findings   PHQ2-9    Flowsheet Row ED [facility]23/[facility] in [facility]  PHQ-2 Total Score 2  PHQ-9 Total Score 3      Flowsheet Row ED from 02/18/2022 in [facility] ED from [facility]/17/2022 in [facility] ED from 02/09/2022 in [facility]  C-SSRS RISK CATEGORY No Risk No Risk No Risk        Musculoskeletal  Strength & Muscle Tone: within normal limits Gait & Station: normal Patient leans: N/A  Psychiatric Specialty Exam  Presentation  General Appearance:  Disheveled  Eye Contact: Good  Speech: Clear and Coherent  Speech Volume: Normal  Handedness: Right   Mood and Affect  Mood: Euphoric  Affect: Congruent   Thought Process  Thought Processes: Coherent  Descriptions of Associations:Intact  Orientation:Full (Time, Place and Person)  Thought Content:Logical  Diagnosis of Schizophrenia or  Schizoaffective disorder in past: Yes  Duration of Psychotic Symptoms: No data recorded  Hallucinations:Hallucinations: None  Ideas of Reference:None  Suicidal Thoughts:Suicidal Thoughts: No  Homicidal Thoughts:Homicidal Thoughts: No   Sensorium  Memory: Immediate Good; Recent Good; Remote Good  Judgment: Fair  Insight: Fair   Art therapist  Concentration: Good  Attention Span: Good  Recall: Good  Fund of Knowledge: Fair  Language: Good   Psychomotor Activity  Psychomotor Activity: Psychomotor Activity: Normal   Assets  Assets: Communication Skills; Desire for Improvement; Financial Resources/Insurance; Leisure Time; Physical Health; Resilience   Sleep  Sleep: Sleep: Good   No data recorded  Physical Exam  Physical Exam Vitals and nursing note reviewed.  Constitutional:      General: She is not in acute distress.    Appearance: She is well-developed.  HENT:     Head: Normocephalic.  Eyes:     General:        Right eye: No discharge.        Left eye: No discharge.     Conjunctiva/sclera: Conjunctivae normal.  Cardiovascular:     Rate and Rhythm: Normal rate.  Pulmonary:     Effort: Pulmonary effort is normal. No respiratory distress.     Breath sounds: Normal breath sounds.  Abdominal:     Palpations: Abdomen is soft.     Tenderness: There is no abdominal tenderness.  Musculoskeletal:     Cervical back: Normal range of motion.  Skin:  Coloration: Skin is not jaundiced or pale.  Neurological:     Mental Status: She is alert and oriented to person, place, and time.  Psychiatric:        Mood and Affect: Mood normal.    Review of Systems  Constitutional: Negative.   HENT: Negative.    Eyes: Negative.   Respiratory: Negative.    Cardiovascular: Negative.   Musculoskeletal: Negative.   Skin: Negative.   Neurological: Negative.   Psychiatric/Behavioral: Negative.     Blood pressure 138/73, pulse (!) 105, temperature  98.1 F (36.7 C), temperature source Oral, resp. rate 20, SpO2 98 %. There is no height or weight on file to calculate BMI.  Treatment Plan Summary:  Plan Pt remains psychiatrically cleared. Pt has been accepted to placement at [facility] on 03/15/21 at 1PM.      Ardis Hughs, NP 03/07/2022 12:39 PM

## 2022-03-08 ENCOUNTER — Encounter (HOSPITAL_COMMUNITY): Payer: Self-pay | Admitting: Registered Nurse

## 2022-03-08 DIAGNOSIS — F209 Schizophrenia, unspecified: Secondary | ICD-10-CM | POA: Diagnosis not present

## 2022-03-08 LAB — GLUCOSE, CAPILLARY
Glucose-Capillary: 155 mg/dL — ABNORMAL HIGH (ref 70–99)
Glucose-Capillary: 123 mg/dL — ABNORMAL HIGH (ref 70–99)
Glucose-Capillary: 197 mg/dL — ABNORMAL HIGH (ref 70–99)
Glucose-Capillary: 158 mg/dL — ABNORMAL HIGH (ref 70–99)

## 2022-03-08 NOTE — ED Notes (Signed)
Pt sleeping@this time. Breathing even and unlabored. Will continue to monitor for safety 

## 2022-03-08 NOTE — ED Notes (Signed)
Patient observed/assessed in bed/chair resting quietly appearing in no distress and verbalizing no complaints at this time. Will continue to monitor.  

## 2022-03-08 NOTE — ED Notes (Signed)
Pt laying in bed calm and cooperative. No c/o pain or distress. Pt watching television. Will continue to monitor for safety

## 2022-03-08 NOTE — ED Notes (Signed)
Pt received breakfast 

## 2022-03-08 NOTE — ED Provider Notes (Signed)
Behavioral Health Progress Note  Date and Time: 03/08/2022 11:24 AM Name: Teresa Murillo MRN:  [id number]  HPI:  Teresa Murillo is a 62 y/o female with PMH of schizophrenia, borderline intellectual functioning, tobacco use d/o, HTN, HLD, DM2 (insulin-dependent), atrial flutter, COPD, admitted to [facility]/23/21 voluntarily, accompanied by [employer], and reported that she was locked out of her group home. It was later confirmed that Teresa Murillo was dismissed from group home due to recurrent history of eloping from the group home. Teresa Murillo has been boarding at this facility until placement is secured.    Subjective:  "I'm going to be leaving ya'll on January 15.  They finally found me a place to stay."    Teresa Murillo, [age] y.o., female patient seen face to face by this provider, consulted with Dr. Nelly RoutArchana Kumar; and chart reviewed on 03/08/22.  On evaluation Teresa Murillo reports there are no changes other than she will be leaving 03/05/22 since a place for her to stay has been found.  States that she is eating/sleeping without difficulty.  States she has no complaints and has been taking medications with no adverse reaction.  Continues to deny suicidal/self-harm/homicidal ideation, psychosis, and paranoia.  Patient continues to be psychiatrically cleared awaiting placement.  Patient has been accepted placement at [facility] on 03/15/2022 at 1:00 PM   Diagnosis:  Final diagnoses:  Schizophrenia, unspecified type (HCC)    Total Time spent with patient: 15 minutes  Past Psychiatric History: schizophrenia, aggressive behavior, and intellectual disability  Past Medical History:  Past Medical History:  Diagnosis Date   Borderline intellectual functioning 12/14/2021   On 12/14/2021: Appreciate assistance from psychology consult. On the Wechsler Adult Intelligence Scale-4, Ms. Goulart achieved a full-scale IQ score of 73 and a percentile rank of 4 placing her in the borderline range of intellectual  functioning.    Chronic obstructive pulmonary disease (COPD) (HCC)    Glaucoma    Hyperlipidemia    Hypertension    Iron deficiency    Schizoaffective disorder (HCC)    Type 2 diabetes mellitus (HCC)     Past Surgical History:  Procedure Laterality Date   TUBAL LIGATION     Family History:  Family History  Problem Relation Age of Onset   Breast cancer Maternal Grandmother    Family Psychiatric  History: None reported Social History:  Social History   Substance and Sexual Activity  Alcohol Use Not Currently     Social History   Substance and Sexual Activity  Drug Use Not Currently    Social History   Socioeconomic History   Marital status: Divorced    Spouse name: Not on file   Number of children: Not on file   Years of education: Not on file   Highest education level: Not on file  Occupational History   Not on file  Tobacco Use   Smoking status: Former    Packs/day: 1.00    Types: Cigars, Cigarettes    Quit date: 11/09/2021    Years since quitting: 0.3   Smokeless tobacco: Current  Vaping Use   Vaping Use: Never used  Substance and Sexual Activity   Alcohol use: Not Currently   Drug use: Not Currently   Sexual activity: Not Currently    Birth control/protection: Surgical  Other Topics Concern   Not on file  Social History Narrative   Not on file   Social Determinants of Health   Financial Resource Strain: Not on file  Food Insecurity: Not on  file  Transportation Needs: Not on file  Physical Activity: Not on file  Stress: Not on file  Social Connections: Not on file   SDOH:  SDOH Screenings   Depression (PHQ2-9): Low Risk  (11/23/2021)  Tobacco Use: High Risk (03/08/2022)   Additional Social History:                         Sleep: Good  Appetite:  Good  Current Medications:  Current Facility-Administered Medications  Medication Dose Route Frequency Provider Last Rate Last Admin   acetaminophen (TYLENOL) tablet 650 mg  650 mg  Oral Q6H PRN Sindy Guadeloupe, NP   650 mg at 03/03/22 2130   albuterol (VENTOLIN HFA) 108 (90 Base) MCG/ACT inhaler 2 puff  2 puff Inhalation Q6H PRN Sindy Guadeloupe, NP   2 puff at 02/20/22 1944   alum & mag hydroxide-simeth (MAALOX/MYLANTA) 200-200-20 MG/5ML suspension 30 mL  30 mL Oral Q4H PRN Sindy Guadeloupe, NP   30 mL at 03/07/22 2155   apixaban (ELIQUIS) tablet 5 mg  5 mg Oral BID Sindy Guadeloupe, NP   5 mg at 03/08/22 0911   ARIPiprazole (ABILIFY) tablet 10 mg  10 mg Oral Daily Sindy Guadeloupe, NP   10 mg at 03/08/22 0911   cloZAPine (CLOZARIL) tablet 75 mg  75 mg Oral BID Park Pope, MD   75 mg at 03/08/22 1610   colchicine tablet 0.6 mg  0.6 mg Oral QHS Sindy Guadeloupe, NP   0.6 mg at 03/07/22 2156   diltiazem (CARDIZEM CD) 24 hr capsule 240 mg  240 mg Oral Daily Sindy Guadeloupe, NP   240 mg at 03/08/22 0911   divalproex (DEPAKOTE ER) 24 hr tablet 500 mg  500 mg Oral BID Sindy Guadeloupe, NP   500 mg at 03/08/22 0912   haloperidol (HALDOL) tablet 10 mg  10 mg Oral TID PRN Sindy Guadeloupe, NP       insulin aspart (novoLOG) injection 0-9 Units  0-9 Units Subcutaneous TID WC Park Pope, MD   1 Units at 03/08/22 0916   insulin glargine-yfgn (SEMGLEE) injection 12 Units  12 Units Subcutaneous Daily Sindy Guadeloupe, NP   12 Units at 03/08/22 0917   latanoprost (XALATAN) 0.005 % ophthalmic solution 1 drop  1 drop Both Eyes QHS Sindy Guadeloupe, NP   1 drop at 03/07/22 2156   magnesium hydroxide (MILK OF MAGNESIA) suspension 30 mL  30 mL Oral Daily PRN Sindy Guadeloupe, NP       metFORMIN (GLUCOPHAGE) tablet 500 mg  500 mg Oral BID WC Sindy Guadeloupe, NP   500 mg at 03/08/22 0912   mometasone-formoterol (DULERA) 200-5 MCG/ACT inhaler 2 puff  2 puff Inhalation BID Sindy Guadeloupe, NP   2 puff at 03/08/22 0912   multivitamins with iron tablet 1 tablet  1 tablet Oral QHS Princess Bruins, DO   1 tablet at 03/07/22 2155   nicotine (NICODERM CQ - dosed in mg/24 hr) patch 7 mg  7 mg Transdermal Daily Sindy Guadeloupe, NP   7 mg at 03/08/22  0919   rosuvastatin (CRESTOR) tablet 20 mg  20 mg Oral QHS Princess Bruins, DO   20 mg at 03/07/22 2156   valbenazine (INGREZZA) capsule 40 mg  40 mg Oral q AM Sindy Guadeloupe, NP   40 mg at 03/08/22 9604   Vitamin D (Ergocalciferol) (DRISDOL) 1.25 MG (50000 UNIT) capsule 50,000 Units  50,000 Units Oral Q Crista Curb, NP   50,000 Units at  03/04/22 0947   Current Outpatient Medications  Medication Sig Dispense Refill   Accu-Chek Softclix Lancets lancets Use as directed up to four times daily 100 each 0   apixaban (ELIQUIS) 5 MG TABS tablet Take 1 tablet (5 mg total) by mouth 2 (two) times daily. 60 tablet 0   ARIPiprazole (ABILIFY) 10 MG tablet Take 1 tablet (10 mg total) by mouth daily. 30 tablet 0   Blood Glucose Monitoring Suppl (ACCU-CHEK GUIDE) w/Device KIT Use as directed up to four times daily 1 kit 0   budesonide-formoterol (SYMBICORT) 160-4.5 MCG/ACT inhaler Inhale 2 puffs into the lungs in the morning and at bedtime.     Cholecalciferol (VITAMIN D3) 1.25 MG (50000 UT) CAPS Take 50,000 Units by mouth every Thursday.     clozapine (CLOZARIL) 50 MG tablet Take 1 tablet (50 mg total) by mouth daily. 30 tablet 0   colchicine 0.6 MG tablet Take 1 tablet (0.6 mg total) by mouth at bedtime. 30 tablet 0   diltiazem (CARDIZEM CD) 240 MG 24 hr capsule Take 1 capsule (240 mg total) by mouth daily. (Patient not taking: Reported on 11/24/2021) 30 capsule 0   divalproex (DEPAKOTE ER) 500 MG 24 hr tablet Take 1 tablet (500 mg total) by mouth 2 (two) times daily. 60 tablet 0   glucose blood test strip Use as directed up to four times daily 50 each 0   haloperidol (HALDOL) 10 MG tablet Take 1 tablet (10 mg total) by mouth 3 (three) times daily as needed for agitation (and psychotic symptoms).     INGREZZA 40 MG capsule Take 1 capsule (40 mg total) by mouth in the morning. 30 capsule 0   insulin aspart (NOVOLOG) 100 UNIT/ML FlexPen Before each meal 3 times a day, 140-199 - 2 units, 200-250 - 4 units,  251-299 - 6 units,  300-349 - 8 units,  350 or above 10 units. Insulin PEN if approved, provide syringes and needles if needed.Please switch to any approved short acting Insulin if needed. 15 mL 0   insulin glargine (LANTUS) 100 UNIT/ML Solostar Pen Inject 12 Units into the skin daily. 15 mL 0   Insulin Pen Needle 32G X 4 MM MISC Use 4 times a day with insulin, 1 month supply. 100 each 0   latanoprost (XALATAN) 0.005 % ophthalmic solution Place 1 drop into both eyes at bedtime.     metFORMIN (GLUCOPHAGE) 500 MG tablet Take 500 mg by mouth 2 (two) times daily with a meal.     nicotine (NICODERM CQ - DOSED IN MG/24 HR) 7 mg/24hr patch Place 1 patch (7 mg total) onto the skin daily. 28 patch 0   PROAIR HFA 108 (90 Base) MCG/ACT inhaler Inhale 2 puffs into the lungs every 6 (six) hours as needed for wheezing or shortness of breath.      Labs  Lab Results:  Admission on 02/18/2022  Component Date Value Ref Range Status   Glucose-Capillary 02/18/2022 254 (H)  70 - 99 mg/dL Final   Glucose reference range applies only to samples taken after fasting for at least 8 hours.   Glucose-Capillary 02/18/2022 112 (H)  70 - 99 mg/dL Final   Glucose reference range applies only to samples taken after fasting for at least 8 hours.   Glucose-Capillary 02/19/2022 159 (H)  70 - 99 mg/dL Final   Glucose reference range applies only to samples taken after fasting for at least 8 hours.   Glucose-Capillary 02/19/2022 160 (H)  70 - 99  mg/dL Final   Glucose reference range applies only to samples taken after fasting for at least 8 hours.   Glucose-Capillary 02/19/2022 177 (H)  70 - 99 mg/dL Final   Glucose reference range applies only to samples taken after fasting for at least 8 hours.   Glucose-Capillary 02/19/2022 147 (H)  70 - 99 mg/dL Final   Glucose reference range applies only to samples taken after fasting for at least 8 hours.   Glucose-Capillary 02/20/2022 126 (H)  70 - 99 mg/dL Final   Glucose reference  range applies only to samples taken after fasting for at least 8 hours.   Glucose-Capillary 02/20/2022 132 (H)  70 - 99 mg/dL Final   Glucose reference range applies only to samples taken after fasting for at least 8 hours.   Glucose-Capillary 02/20/2022 125 (H)  70 - 99 mg/dL Final   Glucose reference range applies only to samples taken after fasting for at least 8 hours.   Glucose-Capillary 02/21/2022 151 (H)  70 - 99 mg/dL Final   Glucose reference range applies only to samples taken after fasting for at least 8 hours.   Glucose-Capillary 02/21/2022 106 (H)  70 - 99 mg/dL Final   Glucose reference range applies only to samples taken after fasting for at least 8 hours.   Glucose-Capillary 02/21/2022 149 (H)  70 - 99 mg/dL Final   Glucose reference range applies only to samples taken after fasting for at least 8 hours.   Glucose-Capillary 02/22/2022 131 (H)  70 - 99 mg/dL Final   Glucose reference range applies only to samples taken after fasting for at least 8 hours.   Glucose-Capillary 02/22/2022 141 (H)  70 - 99 mg/dL Final   Glucose reference range applies only to samples taken after fasting for at least 8 hours.   Glucose-Capillary 02/22/2022 183 (H)  70 - 99 mg/dL Final   Glucose reference range applies only to samples taken after fasting for at least 8 hours.   Glucose-Capillary 02/23/2022 133 (H)  70 - 99 mg/dL Final   Glucose reference range applies only to samples taken after fasting for at least 8 hours.   WBC 02/23/2022 4.8  4.0 - 10.5 K/uL Final   RBC 02/23/2022 3.96  3.87 - 5.11 MIL/uL Final   Hemoglobin 02/23/2022 9.7 (L)  12.0 - 15.0 g/dL Final   HCT 16/11/9602 30.6 (L)  36.0 - 46.0 % Final   MCV 02/23/2022 77.3 (L)  80.0 - 100.0 fL Final   MCH 02/23/2022 24.5 (L)  26.0 - 34.0 pg Final   MCHC 02/23/2022 31.7  30.0 - 36.0 g/dL Final   RDW 54/10/8117 16.5 (H)  11.5 - 15.5 % Final   Platelets 02/23/2022 372  150 - 400 K/uL Final   nRBC 02/23/2022 0.0  0.0 - 0.2 % Final    Neutrophils Relative % 02/23/2022 56  % Final   Neutro Abs 02/23/2022 2.7  1.7 - 7.7 K/uL Final   Lymphocytes Relative 02/23/2022 32  % Final   Lymphs Abs 02/23/2022 1.5  0.7 - 4.0 K/uL Final   Monocytes Relative 02/23/2022 9  % Final   Monocytes Absolute 02/23/2022 0.4  0.1 - 1.0 K/uL Final   Eosinophils Relative 02/23/2022 2  % Final   Eosinophils Absolute 02/23/2022 0.1  0.0 - 0.5 K/uL Final   Basophils Relative 02/23/2022 1  % Final   Basophils Absolute 02/23/2022 0.1  0.0 - 0.1 K/uL Final   Immature Granulocytes 02/23/2022 0  % Final  Abs Immature Granulocytes 02/23/2022 0.01  0.00 - 0.07 K/uL Final   Performed at [facility], [address] [place], [place] [id number]   Glucose-Capillary 02/23/2022 202 (H)  70 - 99 mg/dL Final   Glucose reference range applies only to samples taken after fasting for at least 8 hours.   Glucose-Capillary 02/23/2022 143 (H)  70 - 99 mg/dL Final   Glucose reference range applies only to samples taken after fasting for at least 8 hours.   Glucose-Capillary 02/23/2022 143 (H)  70 - 99 mg/dL Final   Glucose reference range applies only to samples taken after fasting for at least 8 hours.   Glucose-Capillary 02/24/2022 207 (H)  70 - 99 mg/dL Final   Glucose reference range applies only to samples taken after fasting for at least 8 hours.   Glucose-Capillary 02/24/2022 109 (H)  70 - 99 mg/dL Final   Glucose reference range applies only to samples taken after fasting for at least 8 hours.   Glucose-Capillary 02/24/2022 127 (H)  70 - 99 mg/dL Final   Glucose reference range applies only to samples taken after fasting for at least 8 hours.   Glucose-Capillary 02/24/2022 130 (H)  70 - 99 mg/dL Final   Glucose reference range applies only to samples taken after fasting for at least 8 hours.   Glucose-Capillary 02/25/2022 117 (H)  70 - 99 mg/dL Final   Glucose reference range applies only to samples taken after fasting for at least 8 hours.    Glucose-Capillary 02/25/2022 104 (H)  70 - 99 mg/dL Final   Glucose reference range applies only to samples taken after fasting for at least 8 hours.   Glucose-Capillary 02/25/2022 180 (H)  70 - 99 mg/dL Final   Glucose reference range applies only to samples taken after fasting for at least 8 hours.   Glucose-Capillary 02/25/2022 180 (H)  70 - 99 mg/dL Final   Glucose reference range applies only to samples taken after fasting for at least 8 hours.   Glucose-Capillary 02/26/2022 133 (H)  70 - 99 mg/dL Final   Glucose reference range applies only to samples taken after fasting for at least 8 hours.   Glucose-Capillary 02/26/2022 85  70 - 99 mg/dL Final   Glucose reference range applies only to samples taken after fasting for at least 8 hours.   Glucose-Capillary 02/26/2022 136 (H)  70 - 99 mg/dL Final   Glucose reference range applies only to samples taken after fasting for at least 8 hours.   Glucose-Capillary 02/26/2022 170 (H)  70 - 99 mg/dL Final   Glucose reference range applies only to samples taken after fasting for at least 8 hours.   Glucose-Capillary 02/26/2022 185 (H)  70 - 99 mg/dL Final   Glucose reference range applies only to samples taken after fasting for at least 8 hours.   Glucose-Capillary 02/27/2022 124 (H)  70 - 99 mg/dL Final   Glucose reference range applies only to samples taken after fasting for at least 8 hours.   Glucose-Capillary 02/27/2022 161 (H)  70 - 99 mg/dL Final   Glucose reference range applies only to samples taken after fasting for at least 8 hours.   Glucose-Capillary 02/27/2022 170 (H)  70 - 99 mg/dL Final   Glucose reference range applies only to samples taken after fasting for at least 8 hours.   Glucose-Capillary 02/27/2022 154 (H)  70 - 99 mg/dL Final   Glucose reference range applies only to samples taken after  fasting for at least 8 hours.   Glucose-Capillary 02/28/2022 109 (H)  70 - 99 mg/dL Final   Glucose reference range applies only to  samples taken after fasting for at least 8 hours.   Glucose-Capillary 02/28/2022 134 (H)  70 - 99 mg/dL Final   Glucose reference range applies only to samples taken after fasting for at least 8 hours.   Glucose-Capillary 02/28/2022 160 (H)  70 - 99 mg/dL Final   Glucose reference range applies only to samples taken after fasting for at least 8 hours.   Glucose-Capillary 02/28/2022 136 (H)  70 - 99 mg/dL Final   Glucose reference range applies only to samples taken after fasting for at least 8 hours.   Glucose-Capillary 02/28/2022 173 (H)  70 - 99 mg/dL Final   Glucose reference range applies only to samples taken after fasting for at least 8 hours.   Glucose-Capillary 03/01/2022 134 (H)  70 - 99 mg/dL Final   Glucose reference range applies only to samples taken after fasting for at least 8 hours.   Glucose-Capillary 03/01/2022 116 (H)  70 - 99 mg/dL Final   Glucose reference range applies only to samples taken after fasting for at least 8 hours.   Glucose-Capillary 03/01/2022 227 (H)  70 - 99 mg/dL Final   Glucose reference range applies only to samples taken after fasting for at least 8 hours.   Glucose-Capillary 03/01/2022 157 (H)  70 - 99 mg/dL Final   Glucose reference range applies only to samples taken after fasting for at least 8 hours.   Glucose-Capillary 03/02/2022 99  70 - 99 mg/dL Final   Glucose reference range applies only to samples taken after fasting for at least 8 hours.   WBC 03/02/2022 6.0  4.0 - 10.5 K/uL Final   RBC 03/02/2022 4.15  3.87 - 5.11 MIL/uL Final   Hemoglobin 03/02/2022 9.7 (L)  12.0 - 15.0 g/dL Final   HCT 16/11/9602 31.5 (L)  36.0 - 46.0 % Final   MCV 03/02/2022 75.9 (L)  80.0 - 100.0 fL Final   MCH 03/02/2022 23.4 (L)  26.0 - 34.0 pg Final   MCHC 03/02/2022 30.8  30.0 - 36.0 g/dL Final   RDW 54/10/8117 16.4 (H)  11.5 - 15.5 % Final   Platelets 03/02/2022 395  150 - 400 K/uL Final   nRBC 03/02/2022 0.0  0.0 - 0.2 % Final   Neutrophils Relative %  03/02/2022 61  % Final   Neutro Abs 03/02/2022 3.6  1.7 - 7.7 K/uL Final   Lymphocytes Relative 03/02/2022 29  % Final   Lymphs Abs 03/02/2022 1.7  0.7 - 4.0 K/uL Final   Monocytes Relative 03/02/2022 8  % Final   Monocytes Absolute 03/02/2022 0.5  0.1 - 1.0 K/uL Final   Eosinophils Relative 03/02/2022 1  % Final   Eosinophils Absolute 03/02/2022 0.1  0.0 - 0.5 K/uL Final   Basophils Relative 03/02/2022 1  % Final   Basophils Absolute 03/02/2022 0.1  0.0 - 0.1 K/uL Final   Immature Granulocytes 03/02/2022 0  % Final   Abs Immature Granulocytes 03/02/2022 0.01  0.00 - 0.07 K/uL Final   Performed at [facility], [address], [place], [place] [address]   Glucose-Capillary 03/02/2022 171 (H)  70 - 99 mg/dL Final   Glucose reference range applies only to samples taken after fasting for at least 8 hours.   Glucose-Capillary 03/02/2022 108 (H)  70 - 99 mg/dL Final   Glucose reference  range applies only to samples taken after fasting for at least 8 hours.   Glucose-Capillary 03/02/2022 203 (H)  70 - 99 mg/dL Final   Glucose reference range applies only to samples taken after fasting for at least 8 hours.   Glucose-Capillary 03/03/2022 128 (H)  70 - 99 mg/dL Final   Glucose reference range applies only to samples taken after fasting for at least 8 hours.   Glucose-Capillary 03/03/2022 134 (H)  70 - 99 mg/dL Final   Glucose reference range applies only to samples taken after fasting for at least 8 hours.   Glucose-Capillary 03/03/2022 100 (H)  70 - 99 mg/dL Final   Glucose reference range applies only to samples taken after fasting for at least 8 hours.   Glucose-Capillary 03/03/2022 160 (H)  70 - 99 mg/dL Final   Glucose reference range applies only to samples taken after fasting for at least 8 hours.   Glucose-Capillary 03/04/2022 116 (H)  70 - 99 mg/dL Final   Glucose reference range applies only to samples taken after fasting for at least 8 hours.   Glucose-Capillary 03/04/2022 104  (H)  70 - 99 mg/dL Final   Glucose reference range applies only to samples taken after fasting for at least 8 hours.   Glucose-Capillary 03/04/2022 156 (H)  70 - 99 mg/dL Final   Glucose reference range applies only to samples taken after fasting for at least 8 hours.   Glucose-Capillary 03/04/2022 201 (H)  70 - 99 mg/dL Final   Glucose reference range applies only to samples taken after fasting for at least 8 hours.   Glucose-Capillary 03/05/2022 120 (H)  70 - 99 mg/dL Final   Glucose reference range applies only to samples taken after fasting for at least 8 hours.   Glucose-Capillary 03/05/2022 237 (H)  70 - 99 mg/dL Final   Glucose reference range applies only to samples taken after fasting for at least 8 hours.   Glucose-Capillary 03/05/2022 168 (H)  70 - 99 mg/dL Final   Glucose reference range applies only to samples taken after fasting for at least 8 hours.   Glucose-Capillary 03/05/2022 200 (H)  70 - 99 mg/dL Final   Glucose reference range applies only to samples taken after fasting for at least 8 hours.   Glucose-Capillary 03/06/2022 135 (H)  70 - 99 mg/dL Final   Glucose reference range applies only to samples taken after fasting for at least 8 hours.   Glucose-Capillary 03/06/2022 135 (H)  70 - 99 mg/dL Final   Glucose reference range applies only to samples taken after fasting for at least 8 hours.   Glucose-Capillary 03/06/2022 235 (H)  70 - 99 mg/dL Final   Glucose reference range applies only to samples taken after fasting for at least 8 hours.   Glucose-Capillary 03/07/2022 141 (H)  70 - 99 mg/dL Final   Glucose reference range applies only to samples taken after fasting for at least 8 hours.   Glucose-Capillary 03/07/2022 157 (H)  70 - 99 mg/dL Final   Glucose reference range applies only to samples taken after fasting for at least 8 hours.   Glucose-Capillary 03/07/2022 199 (H)  70 - 99 mg/dL Final   Glucose reference range applies only to samples taken after fasting for  at least 8 hours.   Glucose-Capillary 03/07/2022 241 (H)  70 - 99 mg/dL Final   Glucose reference range applies only to samples taken after fasting for at least 8 hours.   Glucose-Capillary 03/08/2022 123 (  H)  70 - 99 mg/dL Final   Glucose reference range applies only to samples taken after fasting for at least 8 hours.   Glucose-Capillary 03/06/2022 155 (H)  70 - 99 mg/dL Final   Glucose reference range applies only to samples taken after fasting for at least 8 hours.  Admission on 02/17/2022, Discharged on 02/18/2022  Component Date Value Ref Range Status   Sodium 02/17/2022 136  135 - 145 mmol/L Final   Potassium 02/17/2022 3.9  3.5 - 5.1 mmol/L Final   Chloride 02/17/2022 101  98 - 111 mmol/L Final   CO2 02/17/2022 25  22 - 32 mmol/L Final   Glucose, Bld 02/17/2022 113 (H)  70 - 99 mg/dL Final   Glucose reference range applies only to samples taken after fasting for at least 8 hours.   BUN 02/17/2022 9  6 - 20 mg/dL Final   Creatinine, Ser 02/17/2022 0.50  0.44 - 1.00 mg/dL Final   Calcium 16/11/9602 8.9  8.9 - 10.3 mg/dL Final   GFR, Estimated 02/17/2022 >60  >60 mL/min Final   Comment: (NOTE) Calculated using the CKD-EPI Creatinine Equation (2021)    Anion gap 02/17/2022 10  5 - 15 Final   Performed at [facility], [address] [place], [place] [address]   WBC 02/17/2022 6.5  4.0 - 10.5 K/uL Final   RBC 02/17/2022 4.24  3.87 - 5.11 MIL/uL Final   Hemoglobin 02/17/2022 10.2 (L)  12.0 - 15.0 g/dL Final   HCT 11/91/4782 33.2 (L)  36.0 - 46.0 % Final   MCV 02/17/2022 78.3 (L)  80.0 - 100.0 fL Final   MCH 02/17/2022 24.1 (L)  26.0 - 34.0 pg Final   MCHC 02/17/2022 30.7  30.0 - 36.0 g/dL Final   RDW 95/62/1308 17.2 (H)  11.5 - 15.5 % Final   Platelets 02/17/2022 390  150 - 400 K/uL Final   nRBC 02/17/2022 0.0  0.0 - 0.2 % Final   Performed at [facility], [address], [place], [place] [address]   Troponin I (High Sensitivity) 02/17/2022 4  <18 ng/L Final    Comment: (NOTE) Elevated high sensitivity troponin I (hsTnI) values and significant  changes across serial measurements may suggest ACS but many other  chronic and acute conditions are known to elevate hsTnI results.  Refer to the "Links" section for chest pain algorithms and additional  guidance. Performed at [facility], [address], [place], [place] [address]    Valproic Acid Lvl 02/17/2022 17 (L)  50.0 - 100.0 ug/mL Final   Performed at [facility], [address] [place], [place] [address]   B Natriuretic Peptide 02/17/2022 30.9  0.0 - 100.0 pg/mL Final   Performed at [facility], [address], [place], [place] [address]   Troponin I (High Sensitivity) 02/18/2022 4  <18 ng/L Final   Comment: (NOTE) Elevated high sensitivity troponin I (hsTnI) values and significant  changes across serial measurements may suggest ACS but many other  chronic and acute conditions are known to elevate hsTnI results.  Refer to the "Links" section for chest pain algorithms and additional  guidance. Performed at [facility], [address], [place], [place] [address]    D-Dimer, Quant 02/18/2022 0.29  0.00 - 0.50 ug/mL-FEU Final   Comment: (NOTE) At the manufacturer cut-off value of 0.5 g/mL FEU, this assay has a negative predictive value of 95-100%.This assay is intended for use  in conjunction with a clinical pretest probability (PTP) assessment model to exclude pulmonary embolism (PE) and deep venous thrombosis (DVT) in outpatients suspected of PE or DVT. Results should be correlated with clinical presentation. Performed at [facility], [address] [place], [place] [address]   Admission on 02/09/2022, Discharged on 02/17/2022  Component Date Value Ref Range Status   Glucose-Capillary 02/09/2022 118 (H)  70 - 99 mg/dL Final   Glucose reference range applies only to samples taken after fasting for at least 8 hours.   Glucose-Capillary 02/10/2022 148  (H)  70 - 99 mg/dL Final   Glucose reference range applies only to samples taken after fasting for at least 8 hours.   Glucose-Capillary 02/10/2022 174 (H)  70 - 99 mg/dL Final   Glucose reference range applies only to samples taken after fasting for at least 8 hours.   Glucose-Capillary 02/10/2022 128 (H)  70 - 99 mg/dL Final   Glucose reference range applies only to samples taken after fasting for at least 8 hours.   Glucose-Capillary 02/10/2022 182 (H)  70 - 99 mg/dL Final   Glucose reference range applies only to samples taken after fasting for at least 8 hours.   Glucose-Capillary 02/11/2022 158 (H)  70 - 99 mg/dL Final   Glucose reference range applies only to samples taken after fasting for at least 8 hours.   Glucose-Capillary 02/11/2022 159 (H)  70 - 99 mg/dL Final   Glucose reference range applies only to samples taken after fasting for at least 8 hours.   Glucose-Capillary 02/11/2022 153 (H)  70 - 99 mg/dL Final   Glucose reference range applies only to samples taken after fasting for at least 8 hours.   Glucose-Capillary 02/11/2022 159 (H)  70 - 99 mg/dL Final   Glucose reference range applies only to samples taken after fasting for at least 8 hours.   Glucose-Capillary 02/11/2022 153 (H)  70 - 99 mg/dL Final   Glucose reference range applies only to samples taken after fasting for at least 8 hours.   Glucose-Capillary 02/11/2022 109 (H)  70 - 99 mg/dL Final   Glucose reference range applies only to samples taken after fasting for at least 8 hours.   Glucose-Capillary 02/11/2022 213 (H)  70 - 99 mg/dL Final   Glucose reference range applies only to samples taken after fasting for at least 8 hours.   Glucose-Capillary 02/11/2022 229 (H)  70 - 99 mg/dL Final   Glucose reference range applies only to samples taken after fasting for at least 8 hours.   Glucose-Capillary 02/12/2022 128 (H)  70 - 99 mg/dL Final   Glucose reference range applies only to samples taken after fasting for  at least 8 hours.   Glucose-Capillary 02/12/2022 149 (H)  70 - 99 mg/dL Final   Glucose reference range applies only to samples taken after fasting for at least 8 hours.   Color, Urine 02/12/2022 YELLOW  YELLOW Final   APPearance 02/12/2022 CLEAR  CLEAR Final   Specific Gravity, Urine 02/12/2022 1.015  1.005 - 1.030 Final   pH 02/12/2022 5.0  5.0 - 8.0 Final   Glucose, UA 02/12/2022 NEGATIVE  NEGATIVE mg/dL Final   Hgb urine dipstick 02/12/2022 NEGATIVE  NEGATIVE Final   Bilirubin Urine 02/12/2022 NEGATIVE  NEGATIVE Final   Ketones, ur 02/12/2022 NEGATIVE  NEGATIVE mg/dL Final   Protein, ur 25/36/6440 NEGATIVE  NEGATIVE mg/dL Final   Nitrite 34/74/2595 NEGATIVE  NEGATIVE Final   Leukocytes,Ua 02/12/2022 NEGATIVE  NEGATIVE Final   Performed at [facility], [address], [place], [place] [address]   Specimen Description 02/12/2022 URINE, CLEAN CATCH   Final   Special Requests 02/12/2022    Final                   Value:NONE Performed at [facility], [address] [place], [place] [address]    Culture 02/12/2022 10,000 COLONIES/mL PROTEUS MIRABILIS (A)   Final   Report Status 02/12/2022 02/15/2022 FINAL   Final   Organism ID, Bacteria 02/12/2022 PROTEUS MIRABILIS (A)   Final   Glucose-Capillary 02/12/2022 128 (H)  70 - 99 mg/dL Final   Glucose reference range applies only to samples taken after fasting for at least 8 hours.   Glucose-Capillary 02/12/2022 196 (H)  70 - 99 mg/dL Final   Glucose reference range applies only to samples taken after fasting for at least 8 hours.   Glucose-Capillary 02/13/2022 168 (H)  70 - 99 mg/dL Final   Glucose reference range applies only to samples taken after fasting for at least 8 hours.   Glucose-Capillary 02/13/2022 153 (H)  70 - 99 mg/dL Final   Glucose reference range applies only to samples taken after fasting for at least 8 hours.   Glucose-Capillary 02/13/2022 134 (H)  70 - 99 mg/dL Final   Glucose reference range applies only  to samples taken after fasting for at least 8 hours.   Glucose-Capillary 02/13/2022 178 (H)  70 - 99 mg/dL Final   Glucose reference range applies only to samples taken after fasting for at least 8 hours.   Glucose-Capillary 02/14/2022 156 (H)  70 - 99 mg/dL Final   Glucose reference range applies only to samples taken after fasting for at least 8 hours.   Glucose-Capillary 02/14/2022 169 (H)  70 - 99 mg/dL Final   Glucose reference range applies only to samples taken after fasting for at least 8 hours.   Glucose-Capillary 02/14/2022 156 (H)  70 - 99 mg/dL Final   Glucose reference range applies only to samples taken after fasting for at least 8 hours.   Glucose-Capillary 02/14/2022 148 (H)  70 - 99 mg/dL Final   Glucose reference range applies only to samples taken after fasting for at least 8 hours.   Glucose-Capillary 02/15/2022 159 (H)  70 - 99 mg/dL Final   Glucose reference range applies only to samples taken after fasting for at least 8 hours.   Glucose-Capillary 02/15/2022 125 (H)  70 - 99 mg/dL Final   Glucose reference range applies only to samples taken after fasting for at least 8 hours.   Glucose-Capillary 02/15/2022 142 (H)  70 - 99 mg/dL Final   Glucose reference range applies only to samples taken after fasting for at least 8 hours.   Glucose-Capillary 02/15/2022 128 (H)  70 - 99 mg/dL Final   Glucose reference range applies only to samples taken after fasting for at least 8 hours.   Glucose-Capillary 02/16/2022 137 (H)  70 - 99 mg/dL Final   Glucose reference range applies only to samples taken after fasting for at least 8 hours.   WBC 02/16/2022 6.0  4.0 - 10.5 K/uL Final   RBC 02/16/2022 4.48  3.87 - 5.11 MIL/uL Final   Hemoglobin 02/16/2022 10.7 (L)  12.0 - 15.0 g/dL Final   HCT 09/81/1914 35.4 (L)  36.0 - 46.0 % Final   MCV 02/16/2022 79.0 (L)  80.0 - 100.0 fL Final  MCH 02/16/2022 23.9 (L)  26.0 - 34.0 pg Final   MCHC 02/16/2022 30.2  30.0 - 36.0 g/dL Final   RDW  16/11/9602 17.0 (H)  11.5 - 15.5 % Final   Platelets 02/16/2022 387  150 - 400 K/uL Final   nRBC 02/16/2022 0.0  0.0 - 0.2 % Final   Neutrophils Relative % 02/16/2022 54  % Final   Neutro Abs 02/16/2022 3.2  1.7 - 7.7 K/uL Final   Lymphocytes Relative 02/16/2022 35  % Final   Lymphs Abs 02/16/2022 2.1  0.7 - 4.0 K/uL Final   Monocytes Relative 02/16/2022 6  % Final   Monocytes Absolute 02/16/2022 0.4  0.1 - 1.0 K/uL Final   Eosinophils Relative 02/16/2022 2  % Final   Eosinophils Absolute 02/16/2022 0.1  0.0 - 0.5 K/uL Final   Basophils Relative 02/16/2022 2  % Final   Basophils Absolute 02/16/2022 0.1  0.0 - 0.1 K/uL Final   Immature Granulocytes 02/16/2022 1  % Final   Abs Immature Granulocytes 02/16/2022 0.04  0.00 - 0.07 K/uL Final   Performed at [facility], [address] [place], [place] [address]   Glucose-Capillary 02/16/2022 131 (H)  70 - 99 mg/dL Final   Glucose reference range applies only to samples taken after fasting for at least 8 hours.   Glucose-Capillary 02/16/2022 123 (H)  70 - 99 mg/dL Final   Glucose reference range applies only to samples taken after fasting for at least 8 hours.   Glucose-Capillary 02/16/2022 195 (H)  70 - 99 mg/dL Final   Glucose reference range applies only to samples taken after fasting for at least 8 hours.   Glucose-Capillary 02/17/2022 148 (H)  70 - 99 mg/dL Final   Glucose reference range applies only to samples taken after fasting for at least 8 hours.  Admission on 02/08/2022, Discharged on 02/09/2022  Component Date Value Ref Range Status   B Natriuretic Peptide 02/08/2022 40.2  0.0 - 100.0 pg/mL Final   Performed at [facility], [address] [place], [place] [address]   Troponin I (High Sensitivity) 02/08/2022 4  <18 ng/L Final   Comment: (NOTE) Elevated high sensitivity troponin I (hsTnI) values and significant  changes across serial measurements may suggest ACS but many other  chronic and acute conditions are  known to elevate hsTnI results.  Refer to the "Links" section for chest pain algorithms and additional  guidance. Performed at [facility], [address] [place], [place] [address]    WBC 02/08/2022 8.5  4.0 - 10.5 K/uL Final   RBC 02/08/2022 4.30  3.87 - 5.11 MIL/uL Final   Hemoglobin 02/08/2022 10.7 (L)  12.0 - 15.0 g/dL Final   HCT 62/13/0865 33.1 (L)  36.0 - 46.0 % Final   MCV 02/08/2022 77.0 (L)  80.0 - 100.0 fL Final   MCH 02/08/2022 24.9 (L)  26.0 - 34.0 pg Final   MCHC 02/08/2022 32.3  30.0 - 36.0 g/dL Final   RDW 78/46/9629 16.5 (H)  11.5 - 15.5 % Final   Platelets 02/08/2022 361  150 - 400 K/uL Final   nRBC 02/08/2022 0.0  0.0 - 0.2 % Final   Performed at [facility], [address], [place], [place] [address]   Sodium 02/08/2022 131 (L)  135 - 145 mmol/L Final   Potassium 02/08/2022 4.4  3.5 - 5.1 mmol/L Final   Chloride 02/08/2022 96 (L)  98 - 111 mmol/L Final  CO2 02/08/2022 25  22 - 32 mmol/L Final   Glucose, Bld 02/08/2022 126 (H)  70 - 99 mg/dL Final   Glucose reference range applies only to samples taken after fasting for at least 8 hours.   BUN 02/08/2022 11  6 - 20 mg/dL Final   Creatinine, Ser 02/08/2022 0.52  0.44 - 1.00 mg/dL Final   Calcium 02/08/2022 9.4  8.9 - 10.3 mg/dL Final   Total Protein 02/08/2022 7.5  6.5 - 8.1 g/dL Final   Albumin 02/08/2022 3.6  3.5 - 5.0 g/dL Final   AST 02/08/2022 22  15 - 41 U/L Final   ALT 02/08/2022 20  0 - 44 U/L Final   Alkaline Phosphatase 02/08/2022 67  38 - 126 U/L Final   Total Bilirubin 02/08/2022 0.1 (L)  0.3 - 1.2 mg/dL Final   GFR, Estimated 02/08/2022 >60  >60 mL/min Final   Comment: (NOTE) Calculated using the CKD-EPI Creatinine Equation (2021)    Anion gap 02/08/2022 10  5 - 15 Final   Performed at [place] [address], [place], [place] [address]   Troponin I (High Sensitivity) 02/08/2022 5  <18 ng/L Final   Comment: (NOTE) Elevated high sensitivity troponin I (hsTnI) values and  significant  changes across serial measurements may suggest ACS but many other  chronic and acute conditions are known to elevate hsTnI results.  Refer to the "Links" section for chest pain algorithms and additional  guidance. Performed at [facility], [place] [address], [place], [place] [address]    WBC 02/09/2022 7.3  4.0 - 10.5 K/uL Final   RBC 02/09/2022 4.70  3.87 - 5.11 MIL/uL Final   Hemoglobin 02/09/2022 11.4 (L)  12.0 - 15.0 g/dL Final   HCT 02/09/2022 36.6  36.0 - 46.0 % Final   MCV 02/09/2022 77.9 (L)  80.0 - 100.0 fL Final   MCH 02/09/2022 24.3 (L)  26.0 - 34.0 pg Final   MCHC 02/09/2022 31.1  30.0 - 36.0 g/dL Final   RDW 02/09/2022 16.6 (H)  11.5 - 15.5 % Final   Platelets 02/09/2022 403 (H)  150 - 400 K/uL Final   nRBC 02/09/2022 0.0  0.0 - 0.2 % Final   Performed at [place] [address], [place], [place] [address]   Glucose-Capillary 02/08/2022 117 (H)  70 - 99 mg/dL Final   Glucose reference range applies only to samples taken after fasting for at least 8 hours.   Sodium 02/09/2022 138  135 - 145 mmol/L Final   Potassium 02/09/2022 3.7  3.5 - 5.1 mmol/L Final   Chloride 02/09/2022 103  98 - 111 mmol/L Final   CO2 02/09/2022 24  22 - 32 mmol/L Final   Glucose, Bld 02/09/2022 134 (H)  70 - 99 mg/dL Final   Glucose reference range applies only to samples taken after fasting for at least 8 hours.   BUN 02/09/2022 9  6 - 20 mg/dL Final   Creatinine, Ser 02/09/2022 0.44  0.44 - 1.00 mg/dL Final   Calcium 02/09/2022 8.3 (L)  8.9 - 10.3 mg/dL Final   GFR, Estimated 02/09/2022 >60  >60 mL/min Final   Comment: (NOTE) Calculated using the CKD-EPI Creatinine Equation (2021)    Anion gap 02/09/2022 11  5 - 15 Final   Performed at [facility], [place] [address] [place],  [address]   Iron 02/09/2022 20 (L)  28 - 170 ug/dL Final   TIBC 02/09/2022 455 (  H)  250 - 450 ug/dL Final   Saturation Ratios 02/09/2022 4 (L)  10.4 - 31.8 % Final   UIBC  02/09/2022 435  ug/dL Final   Performed at [facility], [address], [place], [place] [address]   Ferritin 02/09/2022 2 (L)  11 - 307 ng/mL Final   Performed at [facility], [address] [place], [place] [address]   Weight 02/09/2022 2,846.58  oz Final   Height 02/09/2022 62  in Final   BP 02/09/2022 143/80  mmHg Final   WBC 02/09/2022 8.1  4.0 - 10.5 K/uL Final   RBC 02/09/2022 4.38  3.87 - 5.11 MIL/uL Final   Hemoglobin 02/09/2022 10.9 (L)  12.0 - 15.0 g/dL Final   HCT 09/81/1914 33.3 (L)  36.0 - 46.0 % Final   MCV 02/09/2022 76.0 (L)  80.0 - 100.0 fL Final   MCH 02/09/2022 24.9 (L)  26.0 - 34.0 pg Final   MCHC 02/09/2022 32.7  30.0 - 36.0 g/dL Final   RDW 78/29/5621 16.5 (H)  11.5 - 15.5 % Final   Platelets 02/09/2022 397  150 - 400 K/uL Final   nRBC 02/09/2022 0.0  0.0 - 0.2 % Final   Neutrophils Relative % 02/09/2022 66  % Final   Neutro Abs 02/09/2022 5.4  1.7 - 7.7 K/uL Final   Lymphocytes Relative 02/09/2022 24  % Final   Lymphs Abs 02/09/2022 1.9  0.7 - 4.0 K/uL Final   Monocytes Relative 02/09/2022 8  % Final   Monocytes Absolute 02/09/2022 0.6  0.1 - 1.0 K/uL Final   Eosinophils Relative 02/09/2022 1  % Final   Eosinophils Absolute 02/09/2022 0.1  0.0 - 0.5 K/uL Final   Basophils Relative 02/09/2022 1  % Final   Basophils Absolute 02/09/2022 0.1  0.0 - 0.1 K/uL Final   Immature Granulocytes 02/09/2022 0  % Final   Abs Immature Granulocytes 02/09/2022 0.03  0.00 - 0.07 K/uL Final   Performed at [facility], [address], [place], [place] [address]   Glucose-Capillary 02/09/2022 238 (H)  70 - 99 mg/dL Final   Glucose reference range applies only to samples taken after fasting for at least 8 hours.   Glucose-Capillary 02/09/2022 91  70 - 99 mg/dL Final   Glucose reference range applies only to samples taken after fasting for at least 8 hours.   CRP 02/09/2022 <0.5  <1.0 mg/dL Final   Performed at [facility], [address],  [place], [place] [address]   Sed Rate 02/09/2022 15  0 - 22 mm/hr Final   Performed at [facility], [address], [place], [place] [address]   Glucose-Capillary 02/09/2022 137 (H)  70 - 99 mg/dL Final   Glucose reference range applies only to samples taken after fasting for at least 8 hours.  Admission on 01/24/2022, Discharged on 02/08/2022  Component Date Value Ref Range Status   Glucose-Capillary 01/24/2022 143 (H)  70 - 99 mg/dL Final   Glucose reference range applies only to samples taken after fasting for at least 8 hours.   Glucose-Capillary 01/24/2022 120 (H)  70 - 99 mg/dL Final   Glucose reference range applies only to samples taken after fasting for at least 8 hours.   Glucose-Capillary 01/25/2022 122 (H)  70 - 99 mg/dL Final   Glucose reference range applies only to samples taken after fasting for at least 8 hours.   Glucose-Capillary 01/25/2022 190 (H)  70 -  99 mg/dL Final   Glucose reference range applies only to samples taken after fasting for at least 8 hours.   Glucose-Capillary 01/25/2022 163 (H)  70 - 99 mg/dL Final   Glucose reference range applies only to samples taken after fasting for at least 8 hours.   WBC 01/26/2022 6.5  4.0 - 10.5 K/uL Final   RBC 01/26/2022 4.53  3.87 - 5.11 MIL/uL Final   Hemoglobin 01/26/2022 11.3 (L)  12.0 - 15.0 g/dL Final   HCT 40/98/1191 36.4  36.0 - 46.0 % Final   MCV 01/26/2022 80.4  80.0 - 100.0 fL Final   MCH 01/26/2022 24.9 (L)  26.0 - 34.0 pg Final   MCHC 01/26/2022 31.0  30.0 - 36.0 g/dL Final   RDW 47/82/9562 17.3 (H)  11.5 - 15.5 % Final   Platelets 01/26/2022 327  150 - 400 K/uL Final   nRBC 01/26/2022 0.0  0.0 - 0.2 % Final   Neutrophils Relative % 01/26/2022 60  % Final   Neutro Abs 01/26/2022 3.9  1.7 - 7.7 K/uL Final   Lymphocytes Relative 01/26/2022 31  % Final   Lymphs Abs 01/26/2022 2.0  0.7 - 4.0 K/uL Final   Monocytes Relative 01/26/2022 7  % Final   Monocytes Absolute 01/26/2022 0.5  0.1 - 1.0 K/uL Final    Eosinophils Relative 01/26/2022 1  % Final   Eosinophils Absolute 01/26/2022 0.1  0.0 - 0.5 K/uL Final   Basophils Relative 01/26/2022 1  % Final   Basophils Absolute 01/26/2022 0.1  0.0 - 0.1 K/uL Final   Immature Granulocytes 01/26/2022 0  % Final   Abs Immature Granulocytes 01/26/2022 0.02  0.00 - 0.07 K/uL Final   Performed at [facility], [address], [place], [place] [address]   Glucose-Capillary 01/26/2022 149 (H)  70 - 99 mg/dL Final   Glucose reference range applies only to samples taken after fasting for at least 8 hours.   Glucose-Capillary 01/26/2022 130 (H)  70 - 99 mg/dL Final   Glucose reference range applies only to samples taken after fasting for at least 8 hours.   Glucose-Capillary 01/26/2022 151 (H)  70 - 99 mg/dL Final   Glucose reference range applies only to samples taken after fasting for at least 8 hours.   Glucose-Capillary 01/26/2022 170 (H)  70 - 99 mg/dL Final   Glucose reference range applies only to samples taken after fasting for at least 8 hours.   Glucose-Capillary 01/27/2022 129 (H)  70 - 99 mg/dL Final   Glucose reference range applies only to samples taken after fasting for at least 8 hours.   Glucose-Capillary 01/27/2022 101 (H)  70 - 99 mg/dL Final   Glucose reference range applies only to samples taken after fasting for at least 8 hours.   Glucose-Capillary 01/27/2022 220 (H)  70 - 99 mg/dL Final   Glucose reference range applies only to samples taken after fasting for at least 8 hours.   Glucose-Capillary 01/27/2022 154 (H)  70 - 99 mg/dL Final   Glucose reference range applies only to samples taken after fasting for at least 8 hours.   Glucose-Capillary 01/27/2022 163 (H)  70 - 99 mg/dL Final   Glucose reference range applies only to samples taken after fasting for at least 8 hours.   Glucose-Capillary 01/28/2022 127 (H)  70 - 99 mg/dL Final   Glucose reference range applies only to samples taken after fasting for at least 8 hours.    Glucose-Capillary  01/28/2022 110 (H)  70 - 99 mg/dL Final   Glucose reference range applies only to samples taken after fasting for at least 8 hours.   Glucose-Capillary 01/28/2022 167 (H)  70 - 99 mg/dL Final   Glucose reference range applies only to samples taken after fasting for at least 8 hours.   Glucose-Capillary 01/29/2022 145 (H)  70 - 99 mg/dL Final   Glucose reference range applies only to samples taken after fasting for at least 8 hours.   Glucose-Capillary 01/29/2022 203 (H)  70 - 99 mg/dL Final   Glucose reference range applies only to samples taken after fasting for at least 8 hours.   Glucose-Capillary 01/29/2022 153 (H)  70 - 99 mg/dL Final   Glucose reference range applies only to samples taken after fasting for at least 8 hours.   Glucose-Capillary 01/29/2022 166 (H)  70 - 99 mg/dL Final   Glucose reference range applies only to samples taken after fasting for at least 8 hours.   Glucose-Capillary 01/30/2022 142 (H)  70 - 99 mg/dL Final   Glucose reference range applies only to samples taken after fasting for at least 8 hours.   Glucose-Capillary 01/30/2022 138 (H)  70 - 99 mg/dL Final   Glucose reference range applies only to samples taken after fasting for at least 8 hours.   Glucose-Capillary 01/30/2022 185 (H)  70 - 99 mg/dL Final   Glucose reference range applies only to samples taken after fasting for at least 8 hours.   Glucose-Capillary 01/30/2022 220 (H)  70 - 99 mg/dL Final   Glucose reference range applies only to samples taken after fasting for at least 8 hours.   Glucose-Capillary 01/31/2022 147 (H)  70 - 99 mg/dL Final   Glucose reference range applies only to samples taken after fasting for at least 8 hours.   Glucose-Capillary 01/31/2022 131 (H)  70 - 99 mg/dL Final   Glucose reference range applies only to samples taken after fasting for at least 8 hours.   Glucose-Capillary 01/31/2022 169 (H)  70 - 99 mg/dL Final   Glucose reference range applies only to  samples taken after fasting for at least 8 hours.   Glucose-Capillary 01/31/2022 144 (H)  70 - 99 mg/dL Final   Glucose reference range applies only to samples taken after fasting for at least 8 hours.   Comment 1 01/31/2022 RN AWARE   Final   Glucose-Capillary 02/01/2022 117 (H)  70 - 99 mg/dL Final   Glucose reference range applies only to samples taken after fasting for at least 8 hours.   Glucose-Capillary 02/01/2022 180 (H)  70 - 99 mg/dL Final   Glucose reference range applies only to samples taken after fasting for at least 8 hours.   Glucose-Capillary 02/01/2022 209 (H)  70 - 99 mg/dL Final   Glucose reference range applies only to samples taken after fasting for at least 8 hours.   Glucose-Capillary 02/02/2022 131 (H)  70 - 99 mg/dL Final   Glucose reference range applies only to samples taken after fasting for at least 8 hours.   WBC 02/02/2022 7.5  4.0 - 10.5 K/uL Final   RBC 02/02/2022 4.52  3.87 - 5.11 MIL/uL Final   Hemoglobin 02/02/2022 11.3 (L)  12.0 - 15.0 g/dL Final   HCT 16/11/9602 35.0 (L)  36.0 - 46.0 % Final   MCV 02/02/2022 77.4 (L)  80.0 - 100.0 fL Final   MCH 02/02/2022 25.0 (L)  26.0 - 34.0 pg Final  MCHC 02/02/2022 32.3  30.0 - 36.0 g/dL Final   RDW 19/14/7829 16.7 (H)  11.5 - 15.5 % Final   Platelets 02/02/2022 345  150 - 400 K/uL Final   nRBC 02/02/2022 0.0  0.0 - 0.2 % Final   Neutrophils Relative % 02/02/2022 63  % Final   Neutro Abs 02/02/2022 4.7  1.7 - 7.7 K/uL Final   Lymphocytes Relative 02/02/2022 28  % Final   Lymphs Abs 02/02/2022 2.1  0.7 - 4.0 K/uL Final   Monocytes Relative 02/02/2022 7  % Final   Monocytes Absolute 02/02/2022 0.5  0.1 - 1.0 K/uL Final   Eosinophils Relative 02/02/2022 1  % Final   Eosinophils Absolute 02/02/2022 0.1  0.0 - 0.5 K/uL Final   Basophils Relative 02/02/2022 1  % Final   Basophils Absolute 02/02/2022 0.1  0.0 - 0.1 K/uL Final   Immature Granulocytes 02/02/2022 0  % Final   Abs Immature Granulocytes 02/02/2022  0.03  0.00 - 0.07 K/uL Final   Performed at [facility], [address] [place], [place] [address]   Glucose-Capillary 02/02/2022 95  70 - 99 mg/dL Final   Glucose reference range applies only to samples taken after fasting for at least 8 hours.   Glucose-Capillary 02/02/2022 120 (H)  70 - 99 mg/dL Final   Glucose reference range applies only to samples taken after fasting for at least 8 hours.   Glucose-Capillary 02/02/2022 191 (H)  70 - 99 mg/dL Final   Glucose reference range applies only to samples taken after fasting for at least 8 hours.   Glucose-Capillary 02/03/2022 148 (H)  70 - 99 mg/dL Final   Glucose reference range applies only to samples taken after fasting for at least 8 hours.   Glucose-Capillary 02/03/2022 122 (H)  70 - 99 mg/dL Final   Glucose reference range applies only to samples taken after fasting for at least 8 hours.   Glucose-Capillary 02/03/2022 233 (H)  70 - 99 mg/dL Final   Glucose reference range applies only to samples taken after fasting for at least 8 hours.   Glucose-Capillary 02/04/2022 126 (H)  70 - 99 mg/dL Final   Glucose reference range applies only to samples taken after fasting for at least 8 hours.   Glucose-Capillary 02/04/2022 117 (H)  70 - 99 mg/dL Final   Glucose reference range applies only to samples taken after fasting for at least 8 hours.   Glucose-Capillary 02/04/2022 135 (H)  70 - 99 mg/dL Final   Glucose reference range applies only to samples taken after fasting for at least 8 hours.   Glucose-Capillary 02/04/2022 197 (H)  70 - 99 mg/dL Final   Glucose reference range applies only to samples taken after fasting for at least 8 hours.   Glucose-Capillary 02/05/2022 170 (H)  70 - 99 mg/dL Final   Glucose reference range applies only to samples taken after fasting for at least 8 hours.   Glucose-Capillary 02/05/2022 107 (H)  70 - 99 mg/dL Final   Glucose reference range applies only to samples taken after fasting for at least 8  hours.   Glucose-Capillary 02/05/2022 184 (H)  70 - 99 mg/dL Final   Glucose reference range applies only to samples taken after fasting for at least 8 hours.   Glucose-Capillary 02/05/2022 256 (H)  70 - 99 mg/dL Final   Glucose reference range applies only to samples taken after fasting for at least 8 hours.   Glucose-Capillary 02/06/2022 144 (H)  70 - 99 mg/dL Final   Glucose reference range applies only to samples taken after fasting for at least 8 hours.   Glucose-Capillary 02/06/2022 190 (H)  70 - 99 mg/dL Final   Glucose reference range applies only to samples taken after fasting for at least 8 hours.   Glucose-Capillary 02/06/2022 92  70 - 99 mg/dL Final   Glucose reference range applies only to samples taken after fasting for at least 8 hours.   Trichomonas 02/06/2022 Negative   Final   Bacterial Vaginitis-Urine 02/06/2022 Negative   Final   Molecular Comment 02/06/2022 For tests bacteria and/or candida, this specimen does not meet the   Final   Molecular Comment 02/06/2022 strict criteria set by the FDA. The result interpretation should be   Final   Molecular Comment 02/06/2022 considered in conjunction with the patient's clinical history.   Final   Comment 02/06/2022 Normal Reference Range Trichomonas - Negative   Final   Glucose-Capillary 02/06/2022 197 (H)  70 - 99 mg/dL Final   Glucose reference range applies only to samples taken after fasting for at least 8 hours.   Glucose-Capillary 02/07/2022 129 (H)  70 - 99 mg/dL Final   Glucose reference range applies only to samples taken after fasting for at least 8 hours.   Glucose-Capillary 02/07/2022 207 (H)  70 - 99 mg/dL Final   Glucose reference range applies only to samples taken after fasting for at least 8 hours.   Glucose-Capillary 02/07/2022 153 (H)  70 - 99 mg/dL Final   Glucose reference range applies only to samples taken after fasting for at least 8 hours.   Glucose-Capillary 02/08/2022 129 (H)  70 - 99 mg/dL Final    Glucose reference range applies only to samples taken after fasting for at least 8 hours.   Glucose-Capillary 02/08/2022 107 (H)  70 - 99 mg/dL Final   Glucose reference range applies only to samples taken after fasting for at least 8 hours.  Admission on 01/23/2022, Discharged on 01/24/2022  Component Date Value Ref Range Status   Sodium 01/23/2022 140  135 - 145 mmol/L Final   Potassium 01/23/2022 4.4  3.5 - 5.1 mmol/L Final   Chloride 01/23/2022 101  98 - 111 mmol/L Final   CO2 01/23/2022 25  22 - 32 mmol/L Final   Glucose, Bld 01/23/2022 198 (H)  70 - 99 mg/dL Final   Glucose reference range applies only to samples taken after fasting for at least 8 hours.   BUN 01/23/2022 13  6 - 20 mg/dL Final   Creatinine, Ser 01/23/2022 0.62  0.44 - 1.00 mg/dL Final   Calcium 84/13/2440 9.3  8.9 - 10.3 mg/dL Final   GFR, Estimated 01/23/2022 >60  >60 mL/min Final   Comment: (NOTE) Calculated using the CKD-EPI Creatinine Equation (2021)    Anion gap 01/23/2022 14  5 - 15 Final   Performed at [facility], [address], [place], [place] [address]   WBC 01/23/2022 5.8  4.0 - 10.5 K/uL Final   RBC 01/23/2022 4.63  3.87 - 5.11 MIL/uL Final   Hemoglobin 01/23/2022 11.7 (L)  12.0 - 15.0 g/dL Final   HCT 53/66/4403 37.4  36.0 - 46.0 % Final   MCV 01/23/2022 80.8  80.0 - 100.0 fL Final   MCH 01/23/2022 25.3 (L)  26.0 - 34.0 pg Final   MCHC 01/23/2022 31.3  30.0 - 36.0 g/dL Final   RDW 47/42/5956 17.9 (H)  11.5 - 15.5 % Final  Platelets 01/23/2022 333  150 - 400 K/uL Final   nRBC 01/23/2022 0.0  0.0 - 0.2 % Final   Performed at [facility], [address] [place], [place] [address]   Troponin I (High Sensitivity) 01/23/2022 6  <18 ng/L Final   Comment: (NOTE) Elevated high sensitivity troponin I (hsTnI) values and significant  changes across serial measurements may suggest ACS but many other  chronic and acute conditions are known to elevate hsTnI results.  Refer to the "Links"  section for chest pain algorithms and additional  guidance. Performed at [facility], [address] [place], [place] [address]    Troponin I (High Sensitivity) 01/23/2022 5  <18 ng/L Final   Comment: (NOTE) Elevated high sensitivity troponin I (hsTnI) values and significant  changes across serial measurements may suggest ACS but many other  chronic and acute conditions are known to elevate hsTnI results.  Refer to the "Links" section for chest pain algorithms and additional  guidance. Performed at [facility], [address], [place], [place] [address]    SARS Coronavirus 2 by RT PCR 01/23/2022 NEGATIVE  NEGATIVE Final   Comment: (NOTE) SARS-CoV-2 target nucleic acids are NOT DETECTED.  The SARS-CoV-2 RNA is generally detectable in upper and lower respiratory specimens during the acute phase of infection. The lowest concentration of SARS-CoV-2 viral copies this assay can detect is 250 copies / mL. A negative result does not preclude SARS-CoV-2 infection and should not be used as the sole basis for treatment or other patient management decisions.  A negative result may occur with improper specimen collection / handling, submission of specimen other than nasopharyngeal swab, presence of viral mutation(s) within the areas targeted by this assay, and inadequate number of viral copies (<250 copies / mL). A negative result must be combined with clinical observations, patient history, and epidemiological information.  Fact Sheet for Patients:   RoadLapTop.co.za  Fact Sheet for Healthcare Providers: http://kim-miller.com/  This test is not yet approved or                           cleared by the [place] FDA and has been authorized for detection and/or diagnosis of SARS-CoV-2 by FDA under an Emergency Use Authorization (EUA).  This EUA will remain in effect (meaning this test can be used) for the duration of  the COVID-19 declaration under Section 564(b)(1) of the Act, 21 U.S.C. section 360bbb-3(b)(1), unless the authorization is terminated or revoked sooner.  Performed at [facility], [address], [place], [place] [address]    B Natriuretic Peptide 01/23/2022 41.3  0.0 - 100.0 pg/mL Final   Performed at [facility], [address], [place], [place] [address]   Group A Strep by PCR 01/23/2022 NOT DETECTED  NOT DETECTED Final   Performed at [facility], [address], [place], [place] [address]   Color, Urine 01/23/2022 YELLOW  YELLOW Final   APPearance 01/23/2022 CLEAR  CLEAR Final   Specific Gravity, Urine 01/23/2022 1.018  1.005 - 1.030 Final   pH 01/23/2022 7.0  5.0 - 8.0 Final   Glucose, UA 01/23/2022 NEGATIVE  NEGATIVE mg/dL Final   Hgb urine dipstick 01/23/2022 NEGATIVE  NEGATIVE Final   Bilirubin Urine 01/23/2022 NEGATIVE  NEGATIVE Final   Ketones, ur 01/23/2022 NEGATIVE  NEGATIVE mg/dL Final   Protein, ur 69/62/9528 NEGATIVE  NEGATIVE mg/dL Final   Nitrite 41/32/4401 NEGATIVE  NEGATIVE Final   Leukocytes,Ua 01/23/2022 TRACE (A)  NEGATIVE Final   RBC / HPF 01/23/2022 0-5  0 - 5 RBC/hpf Final   WBC, UA 01/23/2022 0-5  0 - 5 WBC/hpf Final   Bacteria, UA 01/23/2022 NONE SEEN  NONE SEEN Final   Squamous Epithelial / HPF 01/23/2022 0-5  0 - 5 Final   Mucus 01/23/2022 PRESENT   Final   Performed at [facility], [address] [place], [place] [address]   SARS Coronavirus 2 by RT PCR 01/23/2022 NEGATIVE  NEGATIVE Final   Comment: (NOTE) SARS-CoV-2 target nucleic acids are NOT DETECTED.  The SARS-CoV-2 RNA is generally detectable in upper respiratory specimens during the acute phase of infection. The lowest concentration of SARS-CoV-2 viral copies this assay can detect is 138 copies/mL. A negative result does not preclude SARS-Cov-2 infection and should not be used as the sole basis for treatment or other patient management decisions. A negative result  may occur with  improper specimen collection/handling, submission of specimen other than nasopharyngeal swab, presence of viral mutation(s) within the areas targeted by this assay, and inadequate number of viral copies(<138 copies/mL). A negative result must be combined with clinical observations, patient history, and epidemiological information. The expected result is Negative.  Fact Sheet for Patients:  BloggerCourse.com  Fact Sheet for Healthcare Providers:  SeriousBroker.it  This test is no                          t yet approved or cleared by the [place] FDA and  has been authorized for detection and/or diagnosis of SARS-CoV-2 by FDA under an Emergency Use Authorization (EUA). This EUA will remain  in effect (meaning this test can be used) for the duration of the COVID-19 declaration under Section 564(b)(1) of the Act, 21 U.S.C.section 360bbb-3(b)(1), unless the authorization is terminated  or revoked sooner.       Influenza A by PCR 01/23/2022 NEGATIVE  NEGATIVE Final   Influenza B by PCR 01/23/2022 NEGATIVE  NEGATIVE Final   Comment: (NOTE) The Xpert Xpress SARS-CoV-2/FLU/RSV plus assay is intended as an aid in the diagnosis of influenza from Nasopharyngeal swab specimens and should not be used as a sole basis for treatment. Nasal washings and aspirates are unacceptable for Xpert Xpress SARS-CoV-2/FLU/RSV testing.  Fact Sheet for Patients: BloggerCourse.com  Fact Sheet for Healthcare Providers: SeriousBroker.it  This test is not yet approved or cleared by the [place] FDA and has been authorized for detection and/or diagnosis of SARS-CoV-2 by FDA under an Emergency Use Authorization (EUA). This EUA will remain in effect (meaning this test can be used) for the duration of the COVID-19 declaration under Section 564(b)(1) of the Act, 21 U.S.C. section  360bbb-3(b)(1), unless the authorization is terminated or revoked.  Performed at [facility], [address], [place], [place] [address]    WBC 01/24/2022 6.1  4.0 - 10.5 K/uL Final   RBC 01/24/2022 4.60  3.87 - 5.11 MIL/uL Final   Hemoglobin 01/24/2022 11.7 (L)  12.0 - 15.0 g/dL Final   HCT 53/61/4431 37.0  36.0 - 46.0 % Final   MCV 01/24/2022 80.4  80.0 - 100.0 fL Final   MCH 01/24/2022 25.4 (L)  26.0 - 34.0 pg Final   MCHC 01/24/2022 31.6  30.0 - 36.0 g/dL Final   RDW 54/00/8676 17.6 (H)  11.5 - 15.5 % Final   Platelets 01/24/2022 334  150 - 400 K/uL Final  nRBC 01/24/2022 0.0  0.0 - 0.2 % Final   Neutrophils Relative % 01/24/2022 53  % Final   Neutro Abs 01/24/2022 3.3  1.7 - 7.7 K/uL Final   Lymphocytes Relative 01/24/2022 33  % Final   Lymphs Abs 01/24/2022 2.0  0.7 - 4.0 K/uL Final   Monocytes Relative 01/24/2022 11  % Final   Monocytes Absolute 01/24/2022 0.7  0.1 - 1.0 K/uL Final   Eosinophils Relative 01/24/2022 2  % Final   Eosinophils Absolute 01/24/2022 0.1  0.0 - 0.5 K/uL Final   Basophils Relative 01/24/2022 1  % Final   Basophils Absolute 01/24/2022 0.1  0.0 - 0.1 K/uL Final   Immature Granulocytes 01/24/2022 0  % Final   Abs Immature Granulocytes 01/24/2022 0.02  0.00 - 0.07 K/uL Final   Performed at [facility], [address] [place], [place] [address]   Glucose-Capillary 01/24/2022 110 (H)  70 - 99 mg/dL Final   Glucose reference range applies only to samples taken after fasting for at least 8 hours.  No results displayed because visit has over 200 results.    Admission on 11/22/2021, Discharged on 11/22/2021  Component Date Value Ref Range Status   Glucose-Capillary 11/22/2021 169 (H)  70 - 99 mg/dL Final   Glucose reference range applies only to samples taken after fasting for at least 8 hours.  No results displayed because visit has over 200 results.    Admission on 10/04/2021, Discharged on 10/22/2021  Component Date Value Ref Range  Status   SARS Coronavirus 2 by RT PCR 10/04/2021 NEGATIVE  NEGATIVE Final   Comment: (NOTE) SARS-CoV-2 target nucleic acids are NOT DETECTED.  The SARS-CoV-2 RNA is generally detectable in upper respiratory specimens during the acute phase of infection. The lowest concentration of SARS-CoV-2 viral copies this assay can detect is 138 copies/mL. A negative result does not preclude SARS-Cov-2 infection and should not be used as the sole basis for treatment or other patient management decisions. A negative result may occur with  improper specimen collection/handling, submission of specimen other than nasopharyngeal swab, presence of viral mutation(s) within the areas targeted by this assay, and inadequate number of viral copies(<138 copies/mL). A negative result must be combined with clinical observations, patient history, and epidemiological information. The expected result is Negative.  Fact Sheet for Patients:  BloggerCourse.com  Fact Sheet for Healthcare Providers:  SeriousBroker.it  This test is no                          t yet approved or cleared by the [place] FDA and  has been authorized for detection and/or diagnosis of SARS-CoV-2 by FDA under an Emergency Use Authorization (EUA). This EUA will remain  in effect (meaning this test can be used) for the duration of the COVID-19 declaration under Section 564(b)(1) of the Act, 21 U.S.C.section 360bbb-3(b)(1), unless the authorization is terminated  or revoked sooner.       Influenza A by PCR 10/04/2021 NEGATIVE  NEGATIVE Final   Influenza B by PCR 10/04/2021 NEGATIVE  NEGATIVE Final   Comment: (NOTE) The Xpert Xpress SARS-CoV-2/FLU/RSV plus assay is intended as an aid in the diagnosis of influenza from Nasopharyngeal swab specimens and should not be used as a sole basis for treatment. Nasal washings and aspirates are unacceptable for Xpert Xpress  SARS-CoV-2/FLU/RSV testing.  Fact Sheet for Patients: BloggerCourse.com  Fact Sheet for Healthcare Providers: SeriousBroker.it  This test is not yet  approved or cleared by the [place] and has been authorized for detection and/or diagnosis of SARS-CoV-2 by FDA under an Emergency Use Authorization (EUA). This EUA will remain in effect (meaning this test can be used) for the duration of the COVID-19 declaration under Section 564(b)(1) of the Act, 21 U.S.C. section 360bbb-3(b)(1), unless the authorization is terminated or revoked.  Performed at [facility], [address], [place], [place] [address]    WBC 10/04/2021 8.4  4.0 - 10.5 K/uL Final   RBC 10/04/2021 4.42  3.87 - 5.11 MIL/uL Final   Hemoglobin 10/04/2021 11.6 (L)  12.0 - 15.0 g/dL Final   HCT 02/72/5366 35.7 (L)  36.0 - 46.0 % Final   MCV 10/04/2021 80.8  80.0 - 100.0 fL Final   MCH 10/04/2021 26.2  26.0 - 34.0 pg Final   MCHC 10/04/2021 32.5  30.0 - 36.0 g/dL Final   RDW 44/04/4740 16.0 (H)  11.5 - 15.5 % Final   Platelets 10/04/2021 372  150 - 400 K/uL Final   nRBC 10/04/2021 0.0  0.0 - 0.2 % Final   Neutrophils Relative % 10/04/2021 68  % Final   Neutro Abs 10/04/2021 5.7  1.7 - 7.7 K/uL Final   Lymphocytes Relative 10/04/2021 22  % Final   Lymphs Abs 10/04/2021 1.8  0.7 - 4.0 K/uL Final   Monocytes Relative 10/04/2021 8  % Final   Monocytes Absolute 10/04/2021 0.7  0.1 - 1.0 K/uL Final   Eosinophils Relative 10/04/2021 1  % Final   Eosinophils Absolute 10/04/2021 0.1  0.0 - 0.5 K/uL Final   Basophils Relative 10/04/2021 1  % Final   Basophils Absolute 10/04/2021 0.1  0.0 - 0.1 K/uL Final   Immature Granulocytes 10/04/2021 0  % Final   Abs Immature Granulocytes 10/04/2021 0.03  0.00 - 0.07 K/uL Final   Performed at [facility], [address], [place], [place] [address]   Sodium 10/04/2021 136  135 - 145 mmol/L Final   Potassium 10/04/2021  4.2  3.5 - 5.1 mmol/L Final   Chloride 10/04/2021 104  98 - 111 mmol/L Final   CO2 10/04/2021 25  22 - 32 mmol/L Final   Glucose, Bld 10/04/2021 100 (H)  70 - 99 mg/dL Final   Glucose reference range applies only to samples taken after fasting for at least 8 hours.   BUN 10/04/2021 9  6 - 20 mg/dL Final   Creatinine, Ser 10/04/2021 0.52  0.44 - 1.00 mg/dL Final   Calcium 87/56/4332 9.0  8.9 - 10.3 mg/dL Final   Total Protein 95/18/8416 7.0  6.5 - 8.1 g/dL Final   Albumin 60/63/0160 3.2 (L)  3.5 - 5.0 g/dL Final   AST 10/93/2355 13 (L)  15 - 41 U/L Final   ALT 10/04/2021 10  0 - 44 U/L Final   Alkaline Phosphatase 10/04/2021 61  38 - 126 U/L Final   Total Bilirubin 10/04/2021 0.3  0.3 - 1.2 mg/dL Final   GFR, Estimated 10/04/2021 >60  >60 mL/min Final   Comment: (NOTE) Calculated using the CKD-EPI Creatinine Equation (2021)    Anion gap 10/04/2021 7  5 - 15 Final   Performed at [facility], [address], [place], [place] [address]   Hgb A1c MFr Bld 10/04/2021 7.0 (H)  4.8 - 5.6 % Final   Comment: (NOTE) Pre diabetes:          5.7%-6.4%  Diabetes:              >  6.4%  Glycemic control for   <7.0% adults with diabetes    Mean Plasma Glucose 10/04/2021 154.2  mg/dL Final   Performed at [facility], [address], [place], [place] [address]   Cholesterol 10/04/2021 178  0 - 200 mg/dL Final   Triglycerides 56/38/7564 155 (H)  <150 mg/dL Final   HDL 33/29/5188 52  >40 mg/dL Final   Total CHOL/HDL Ratio 10/04/2021 3.4  RATIO Final   VLDL 10/04/2021 31  0 - 40 mg/dL Final   LDL Cholesterol 10/04/2021 95  0 - 99 mg/dL Final   Comment:        Total Cholesterol/HDL:CHD Risk Coronary Heart Disease Risk Table                     Men   Women  1/2 Average Risk   3.4   3.3  Average Risk       5.0   4.4  2 X Average Risk   9.6   7.1  3 X Average Risk  23.4   11.0        Use the calculated Patient Ratio above and the CHD Risk Table to determine the patient's CHD Risk.         ATP III CLASSIFICATION (LDL):  <100     mg/dL   Optimal  416-606  mg/dL   Near or Above                    Optimal  130-159  mg/dL   Borderline  301-601  mg/dL   High  >093     mg/dL   Very High Performed at [facility], [address], [place], [place] [address]    POC Amphetamine UR 10/04/2021 None Detected  NONE DETECTED (Cut Off Level 1000 ng/mL) Final   POC Secobarbital (BAR) 10/04/2021 None Detected  NONE DETECTED (Cut Off Level 300 ng/mL) Final   POC Buprenorphine (BUP) 10/04/2021 None Detected  NONE DETECTED (Cut Off Level 10 ng/mL) Final   POC Oxazepam (BZO) 10/04/2021 None Detected  NONE DETECTED (Cut Off Level 300 ng/mL) Final   POC Cocaine UR 10/04/2021 None Detected  NONE DETECTED (Cut Off Level 300 ng/mL) Final   POC Methamphetamine UR 10/04/2021 None Detected  NONE DETECTED (Cut Off Level 1000 ng/mL) Final   POC Morphine 10/04/2021 None Detected  NONE DETECTED (Cut Off Level 300 ng/mL) Final   POC Methadone UR 10/04/2021 None Detected  NONE DETECTED (Cut Off Level 300 ng/mL) Final   POC Oxycodone UR 10/04/2021 Positive (A)  NONE DETECTED (Cut Off Level 100 ng/mL) Final   POC Marijuana UR 10/04/2021 None Detected  NONE DETECTED (Cut Off Level 50 ng/mL) Final   SARSCOV2ONAVIRUS 2 AG 10/04/2021 NEGATIVE  NEGATIVE Final   Comment: (NOTE) SARS-CoV-2 antigen NOT DETECTED.   Negative results are presumptive.  Negative results do not preclude SARS-CoV-2 infection and should not be used as the sole basis for treatment or other patient management decisions, including infection  control decisions, particularly in the presence of clinical signs and  symptoms consistent with COVID-19, or in those who have been in contact with the virus.  Negative results must be combined with clinical observations, patient history, and epidemiological information. The expected result is Negative.  Fact Sheet for Patients: https://www.jennings-kim.com/  Fact Sheet for  Healthcare Providers: https://alexander-rogers.biz/  This test is not yet approved or cleared by the [place] FDA and  has been authorized for detection  and/or diagnosis of SARS-CoV-2 by FDA under an Emergency Use Authorization (EUA).  This EUA will remain in effect (meaning this test can be used) for the duration of  the COV                          ID-19 declaration under Section 564(b)(1) of the Act, 21 U.S.C. section 360bbb-3(b)(1), unless the authorization is terminated or revoked sooner.     Valproic Acid Lvl 10/04/2021 51  50.0 - 100.0 ug/mL Final   Performed at [facility], [address], [place], [place] [address]   Valproic Acid Lvl 10/08/2021 57  50.0 - 100.0 ug/mL Final   Performed at [facility], [address] [place], [place] [address]   Glucose-Capillary 10/09/2021 123 (H)  70 - 99 mg/dL Final   Glucose reference range applies only to samples taken after fasting for at least 8 hours.  There may be more visits with results that are not included.    Blood Alcohol level:  Lab Results  Component Value Date   ETH <10 11/23/2021   ETH <10 11/08/2021    Metabolic Disorder Labs: Lab Results  Component Value Date   HGBA1C 6.7 (H) 12/28/2021   MPG 145.59 12/28/2021   MPG 154.2 10/04/2021   Lab Results  Component Value Date   PROLACTIN 3.8 (L) 11/23/2021   Lab Results  Component Value Date   CHOL 171 11/23/2021   TRIG 125 11/23/2021   HDL 65 11/23/2021   CHOLHDL 2.6 11/23/2021   VLDL 25 11/23/2021   LDLCALC 81 11/23/2021   LDLCALC 95 10/04/2021    Therapeutic Lab Levels: No results found for: "LITHIUM" Lab Results  Component Value Date   VALPROATE 17 (L) 02/17/2022   VALPROATE 33 (L) 01/18/2022   No results found for: "CBMZ"  Physical Findings   PHQ2-9    Flowsheet Row ED from 11/23/2021 in [facility]  PHQ-2 Total Score 2  PHQ-9 Total Score 3      Flowsheet Row ED from 02/18/2022  in [facility] ED from 02/17/2022 in [facility] EMERGENCY DEPARTMENT ED from 02/09/2022 in [facility]  C-SSRS RISK CATEGORY No Risk No Risk No Risk        Musculoskeletal  Strength & Muscle Tone: within normal limits Gait & Station: normal Patient leans: N/A  Psychiatric Specialty Exam  Presentation  General Appearance:  Disheveled  Eye Contact: Good  Speech: Clear and Coherent  Speech Volume: Normal  Handedness: Right   Mood and Affect  Mood: Euthymic  Affect: Appropriate; Congruent   Thought Process  Thought Processes: Coherent; Goal Directed  Descriptions of Associations:Intact  Orientation:Full (Time, Place and Person)  Thought Content:Logical  Diagnosis of Schizophrenia or Schizoaffective disorder in past: No  Duration of Psychotic Symptoms: No data recorded  Hallucinations:Hallucinations: None  Ideas of Reference:None  Suicidal Thoughts:Suicidal Thoughts: No  Homicidal Thoughts:Homicidal Thoughts: No   Sensorium  Memory: Immediate Good; Recent Good; Remote Good  Judgment: Intact  Insight: Present   Executive Functions  Concentration: Good  Attention Span: Good  Recall: Good  Fund of Knowledge: Fair  Language: Good   Psychomotor Activity  Psychomotor Activity: Psychomotor Activity: Normal   Assets  Assets: Communication Skills; Desire for Improvement; Financial Resources/Insurance; Leisure Time; Physical Health; Resilience   Sleep  Sleep: Sleep: Good   Nutritional Assessment (For OBS and FBC admissions only) Has  the patient had a weight loss or gain of 10 pounds or more in the last 3 months?: No Has the patient had a decrease in food intake/or appetite?: No Does the patient have dental problems?: No Does the patient have eating habits or behaviors that may be indicators of an eating disorder including binging or inducing vomiting?:  No Has the patient recently lost weight without trying?: 0 Has the patient been eating poorly because of a decreased appetite?: 0 Malnutrition Screening Tool Score: 0    Physical Exam  Physical Exam Vitals and nursing note reviewed.  Constitutional:      General: She is not in acute distress.    Appearance: She is well-developed.  HENT:     Head: Normocephalic.  Eyes:     General:        Right eye: No discharge.        Left eye: No discharge.     Conjunctiva/sclera: Conjunctivae normal.  Cardiovascular:     Rate and Rhythm: Normal rate.  Pulmonary:     Effort: Pulmonary effort is normal. No respiratory distress.     Breath sounds: Normal breath sounds.  Abdominal:     Palpations: Abdomen is soft.     Tenderness: There is no abdominal tenderness.  Musculoskeletal:     Cervical back: Normal range of motion.  Skin:    Coloration: Skin is not jaundiced or pale.  Neurological:     Mental Status: She is alert and oriented to person, place, and time.  Psychiatric:        Attention and Perception: Attention and perception normal. She does not perceive auditory or visual hallucinations.        Mood and Affect: Mood normal.        Speech: Speech normal.        Behavior: Behavior normal. Behavior is cooperative.        Thought Content: Thought content normal. Thought content is not paranoid. Thought content does not include homicidal or suicidal ideation.    Review of Systems  Constitutional: Negative.   HENT: Negative.    Eyes: Negative.   Respiratory: Negative.    Cardiovascular: Negative.   Musculoskeletal: Negative.   Skin: Negative.   Neurological: Negative.   Psychiatric/Behavioral: Negative.  Negative for substance abuse. Depression: Stable. Hallucinations: Denies. Suicidal ideas: Denies.The patient does not have insomnia. Nervous/anxious: Stable.   Blood pressure 139/74, pulse 92, temperature 97.6 F (36.4 C), temperature source Tympanic, resp. rate 18, SpO2 100 %.  There is no height or weight on file to calculate BMI.  Treatment Plan Summary: Plan Patient remains psychiatrically cleared awaiting placement.  Patient has been accepted placement at [facility] on 03/15/2022 at 1:00 PM  Jonael Paradiso, NP 03/08/2022 11:24 AM

## 2022-03-08 NOTE — ED Notes (Signed)
Pt resting quietly, breathing is even and unlabored.  Pt denies SI, HI, pain and AVH.  Will continue to monitor for safety.  

## 2022-03-09 DIAGNOSIS — F209 Schizophrenia, unspecified: Secondary | ICD-10-CM | POA: Diagnosis not present

## 2022-03-09 LAB — GLUCOSE, CAPILLARY
Glucose-Capillary: 118 mg/dL — ABNORMAL HIGH (ref 70–99)
Glucose-Capillary: 142 mg/dL — ABNORMAL HIGH (ref 70–99)
Glucose-Capillary: 243 mg/dL — ABNORMAL HIGH (ref 70–99)
Glucose-Capillary: 171 mg/dL — ABNORMAL HIGH (ref 70–99)

## 2022-03-09 NOTE — Progress Notes (Signed)
Patient is currently sleeping with respirations even and unlabored.

## 2022-03-09 NOTE — ED Provider Notes (Signed)
Behavioral Health Progress Note  Date and Time: 03/09/2022 8:32 AM Name: Teresa Murillo MRN:  [id number]  Subjective:    Teresa Murillo is a 62 y/o female with PMH of schizophrenia, borderline intellectual functioning, tobacco use d/o, HTN, HLD, DM2 (insulin-dependent), atrial flutter, COPD, admitted to [facility] on 11/23/21 voluntarily, accompanied by [employer], and reported that she was locked out of her group home. It was later confirmed that Teresa Murillo was dismissed from group home due to recurrent history of eloping from the group home. Teresa Murillo has been boarding at this facility until placement is secured.   "I'm happy"  Teresa Murillo is standing by nursing station on my approach. She reports euthymic mood. She denies mood disturbances, including depression, anxiety, euphoria. She reports "I'm happy". She denies suicidal, homicidal, or violent ideations. She denies auditory visual hallucinations or paranoia. Objectively, there is no evidence of agitation, aggression, distractibility, internal preoccupation. No delusions or paranoia elicited. Pt remains psychiatrically cleared. Pending discharge on 03/15/21 to [facility].   Diagnosis:  Final diagnoses:  Schizophrenia, unspecified type (HCC)    Total Time spent with patient: 15 minutes  Past Psychiatric History: see HPI Past Medical History:  Past Medical History:  Diagnosis Date   Borderline intellectual functioning 12/14/2021   On 12/14/2021: Appreciate assistance from psychology consult. On the Wechsler Adult Intelligence Scale-4, Teresa Murillo achieved a full-scale IQ score of 73 and a percentile rank of 4 placing her in the borderline range of intellectual functioning.    Chronic obstructive pulmonary disease (COPD) (HCC)    Glaucoma    Hyperlipidemia    Hypertension    Iron deficiency    Schizoaffective disorder (HCC)    Type 2 diabetes mellitus (HCC)     Past Surgical History:  Procedure Laterality Date   TUBAL LIGATION      Family History:  Family History  Problem Relation Age of Onset   Breast cancer Maternal Grandmother    Family Psychiatric  History: None reported Social History:  Social History   Substance and Sexual Activity  Alcohol Use Not Currently     Social History   Substance and Sexual Activity  Drug Use Not Currently    Social History   Socioeconomic History   Marital status: Divorced    Spouse name: Not on file   Number of children: Not on file   Years of education: Not on file   Highest education level: Not on file  Occupational History   Not on file  Tobacco Use   Smoking status: Former    Packs/day: 1.00    Types: Cigars, Cigarettes    Quit date: 11/09/2021    Years since quitting: 0.3   Smokeless tobacco: Current  Vaping Use   Vaping Use: Never used  Substance and Sexual Activity   Alcohol use: Not Currently   Drug use: Not Currently   Sexual activity: Not Currently    Birth control/protection: Surgical  Other Topics Concern   Not on file  Social History Narrative   Not on file   Social Determinants of Health   Financial Resource Strain: Not on file  Food Insecurity: Not on file  Transportation Needs: Not on file  Physical Activity: Not on file  Stress: Not on file  Social Connections: Not on file   SDOH:  SDOH Screenings   Depression (PHQ2-9): Low Risk  (11/23/2021)  Tobacco Use: High Risk (03/08/2022)   Additional Social History:  Sleep: Good  Appetite:  Good  Current Medications:  Current Facility-Administered Medications  Medication Dose Route Frequency Provider Last Rate Last Admin   acetaminophen (TYLENOL) tablet 650 mg  650 mg Oral Q6H PRN Sindy Guadeloupe, NP   650 mg at 03/03/22 2130   albuterol (VENTOLIN HFA) 108 (90 Base) MCG/ACT inhaler 2 puff  2 puff Inhalation Q6H PRN Sindy Guadeloupe, NP   2 puff at 02/20/22 1944   alum & mag hydroxide-simeth (MAALOX/MYLANTA) 200-200-20 MG/5ML suspension 30 mL  30 mL Oral  Q4H PRN Sindy Guadeloupe, NP   30 mL at 03/07/22 2155   apixaban (ELIQUIS) tablet 5 mg  5 mg Oral BID Sindy Guadeloupe, NP   5 mg at 03/08/22 2037   ARIPiprazole (ABILIFY) tablet 10 mg  10 mg Oral Daily Sindy Guadeloupe, NP   10 mg at 03/08/22 0911   cloZAPine (CLOZARIL) tablet 75 mg  75 mg Oral BID Park Pope, MD   75 mg at 03/08/22 2037   colchicine tablet 0.6 mg  0.6 mg Oral QHS Sindy Guadeloupe, NP   0.6 mg at 03/08/22 2037   diltiazem (CARDIZEM CD) 24 hr capsule 240 mg  240 mg Oral Daily Sindy Guadeloupe, NP   240 mg at 03/08/22 0911   divalproex (DEPAKOTE ER) 24 hr tablet 500 mg  500 mg Oral BID Sindy Guadeloupe, NP   500 mg at 03/08/22 2037   haloperidol (HALDOL) tablet 10 mg  10 mg Oral TID PRN Sindy Guadeloupe, NP       insulin aspart (novoLOG) injection 0-9 Units  0-9 Units Subcutaneous TID [facility] Park Pope, MD   2 Units at 03/08/22 1658   insulin glargine-yfgn (SEMGLEE) injection 12 Units  12 Units Subcutaneous Daily Sindy Guadeloupe, NP   12 Units at 03/08/22 0917   latanoprost (XALATAN) 0.005 % ophthalmic solution 1 drop  1 drop Both Eyes QHS Sindy Guadeloupe, NP   1 drop at 03/08/22 2044   magnesium hydroxide (MILK OF MAGNESIA) suspension 30 mL  30 mL Oral Daily PRN Sindy Guadeloupe, NP       metFORMIN (GLUCOPHAGE) tablet 500 mg  500 mg Oral BID WC Sindy Guadeloupe, NP   500 mg at 03/09/22 0825   mometasone-formoterol (DULERA) 200-5 MCG/ACT inhaler 2 puff  2 puff Inhalation BID Sindy Guadeloupe, NP   2 puff at 03/09/22 0825   multivitamins with iron tablet 1 tablet  1 tablet Oral QHS Princess Bruins, DO   1 tablet at 03/08/22 2037   nicotine (NICODERM CQ - dosed in mg/24 hr) patch 7 mg  7 mg Transdermal Daily Sindy Guadeloupe, NP   7 mg at 03/08/22 0919   rosuvastatin (CRESTOR) tablet 20 mg  20 mg Oral QHS Princess Bruins, DO   20 mg at 03/08/22 2037   valbenazine (INGREZZA) capsule 40 mg  40 mg Oral q AM Sindy Guadeloupe, NP   40 mg at 03/09/22 0618   Vitamin D (Ergocalciferol) (DRISDOL) 1.25 MG (50000 UNIT) capsule 50,000 Units   50,000 Units Oral Q Crista Curb, NP   50,000 Units at 03/04/22 1610   Current Outpatient Medications  Medication Sig Dispense Refill   Accu-Chek Softclix Lancets lancets Use as directed up to four times daily 100 each 0   apixaban (ELIQUIS) 5 MG TABS tablet Take 1 tablet (5 mg total) by mouth 2 (two) times daily. 60 tablet 0   ARIPiprazole (ABILIFY) 10 MG tablet Take 1 tablet (10 mg total) by mouth daily. 30 tablet 0  Blood Glucose Monitoring Suppl (ACCU-CHEK GUIDE) w/Device KIT Use as directed up to four times daily 1 kit 0   budesonide-formoterol (SYMBICORT) 160-4.5 MCG/ACT inhaler Inhale 2 puffs into the lungs in the morning and at bedtime.     Cholecalciferol (VITAMIN D3) 1.25 MG (50000 UT) CAPS Take 50,000 Units by mouth every Thursday.     clozapine (CLOZARIL) 50 MG tablet Take 1 tablet (50 mg total) by mouth daily. 30 tablet 0   colchicine 0.6 MG tablet Take 1 tablet (0.6 mg total) by mouth at bedtime. 30 tablet 0   diltiazem (CARDIZEM CD) 240 MG 24 hr capsule Take 1 capsule (240 mg total) by mouth daily. (Patient not taking: Reported on 11/24/2021) 30 capsule 0   divalproex (DEPAKOTE ER) 500 MG 24 hr tablet Take 1 tablet (500 mg total) by mouth 2 (two) times daily. 60 tablet 0   glucose blood test strip Use as directed up to four times daily 50 each 0   haloperidol (HALDOL) 10 MG tablet Take 1 tablet (10 mg total) by mouth 3 (three) times daily as needed for agitation (and psychotic symptoms).     INGREZZA 40 MG capsule Take 1 capsule (40 mg total) by mouth in the morning. 30 capsule 0   insulin aspart (NOVOLOG) 100 UNIT/ML FlexPen Before each meal 3 times a day, 140-199 - 2 units, 200-250 - 4 units, 251-299 - 6 units,  300-349 - 8 units,  350 or above 10 units. Insulin PEN if approved, provide syringes and needles if needed.Please switch to any approved short acting Insulin if needed. 15 mL 0   insulin glargine (LANTUS) 100 UNIT/ML Solostar Pen Inject 12 Units into the skin daily.  15 mL 0   Insulin Pen Needle 32G X 4 MM MISC Use 4 times a day with insulin, 1 month supply. 100 each 0   latanoprost (XALATAN) 0.005 % ophthalmic solution Place 1 drop into both eyes at bedtime.     metFORMIN (GLUCOPHAGE) 500 MG tablet Take 500 mg by mouth 2 (two) times daily with a meal.     nicotine (NICODERM CQ - DOSED IN MG/24 HR) 7 mg/24hr patch Place 1 patch (7 mg total) onto the skin daily. 28 patch 0   PROAIR HFA 108 (90 Base) MCG/ACT inhaler Inhale 2 puffs into the lungs every 6 (six) hours as needed for wheezing or shortness of breath.      Labs  Lab Results:  Admission on 02/18/2022  Component Date Value Ref Range Status   Glucose-Capillary 02/18/2022 254 (H)  70 - 99 mg/dL Final   Glucose reference range applies only to samples taken after fasting for at least 8 hours.   Glucose-Capillary 02/18/2022 112 (H)  70 - 99 mg/dL Final   Glucose reference range applies only to samples taken after fasting for at least 8 hours.   Glucose-Capillary 02/19/2022 159 (H)  70 - 99 mg/dL Final   Glucose reference range applies only to samples taken after fasting for at least 8 hours.   Glucose-Capillary 02/19/2022 160 (H)  70 - 99 mg/dL Final   Glucose reference range applies only to samples taken after fasting for at least 8 hours.   Glucose-Capillary 02/19/2022 177 (H)  70 - 99 mg/dL Final   Glucose reference range applies only to samples taken after fasting for at least 8 hours.   Glucose-Capillary 02/19/2022 147 (H)  70 - 99 mg/dL Final   Glucose reference range applies only to samples taken after fasting for  at least 8 hours.   Glucose-Capillary 02/20/2022 126 (H)  70 - 99 mg/dL Final   Glucose reference range applies only to samples taken after fasting for at least 8 hours.   Glucose-Capillary 02/20/2022 132 (H)  70 - 99 mg/dL Final   Glucose reference range applies only to samples taken after fasting for at least 8 hours.   Glucose-Capillary 02/20/2022 125 (H)  70 - 99 mg/dL Final    Glucose reference range applies only to samples taken after fasting for at least 8 hours.   Glucose-Capillary 02/21/2022 151 (H)  70 - 99 mg/dL Final   Glucose reference range applies only to samples taken after fasting for at least 8 hours.   Glucose-Capillary 02/21/2022 106 (H)  70 - 99 mg/dL Final   Glucose reference range applies only to samples taken after fasting for at least 8 hours.   Glucose-Capillary 02/21/2022 149 (H)  70 - 99 mg/dL Final   Glucose reference range applies only to samples taken after fasting for at least 8 hours.   Glucose-Capillary 02/22/2022 131 (H)  70 - 99 mg/dL Final   Glucose reference range applies only to samples taken after fasting for at least 8 hours.   Glucose-Capillary 02/22/2022 141 (H)  70 - 99 mg/dL Final   Glucose reference range applies only to samples taken after fasting for at least 8 hours.   Glucose-Capillary 02/22/2022 183 (H)  70 - 99 mg/dL Final   Glucose reference range applies only to samples taken after fasting for at least 8 hours.   Glucose-Capillary 02/23/2022 133 (H)  70 - 99 mg/dL Final   Glucose reference range applies only to samples taken after fasting for at least 8 hours.   WBC 02/23/2022 4.8  4.0 - 10.5 K/uL Final   RBC 02/23/2022 3.96  3.87 - 5.11 MIL/uL Final   Hemoglobin 02/23/2022 9.7 (L)  12.0 - 15.0 g/dL Final   HCT 16/11/9602 30.6 (L)  36.0 - 46.0 % Final   MCV 02/23/2022 77.3 (L)  80.0 - 100.0 fL Final   MCH 02/23/2022 24.5 (L)  26.0 - 34.0 pg Final   MCHC 02/23/2022 31.7  30.0 - 36.0 g/dL Final   RDW 54/10/8117 16.5 (H)  11.5 - 15.5 % Final   Platelets 02/23/2022 372  150 - 400 K/uL Final   nRBC 02/23/2022 0.0  0.0 - 0.2 % Final   Neutrophils Relative % 02/23/2022 56  % Final   Neutro Abs 02/23/2022 2.7  1.7 - 7.7 K/uL Final   Lymphocytes Relative 02/23/2022 32  % Final   Lymphs Abs 02/23/2022 1.5  0.7 - 4.0 K/uL Final   Monocytes Relative 02/23/2022 9  % Final   Monocytes Absolute 02/23/2022 0.4  0.1 - 1.0 K/uL  Final   Eosinophils Relative 02/23/2022 2  % Final   Eosinophils Absolute 02/23/2022 0.1  0.0 - 0.5 K/uL Final   Basophils Relative 02/23/2022 1  % Final   Basophils Absolute 02/23/2022 0.1  0.0 - 0.1 K/uL Final   Immature Granulocytes 02/23/2022 0  % Final   Abs Immature Granulocytes 02/23/2022 0.01  0.00 - 0.07 K/uL Final   Performed at [name], [address] [place], [place] [address]   Glucose-Capillary 02/23/2022 202 (H)  70 - 99 mg/dL Final   Glucose reference range applies only to samples taken after fasting for at least 8 hours.   Glucose-Capillary 02/23/2022 143 (H)  70 - 99 mg/dL Final   Glucose reference range  applies only to samples taken after fasting for at least 8 hours.   Glucose-Capillary 02/23/2022 143 (H)  70 - 99 mg/dL Final   Glucose reference range applies only to samples taken after fasting for at least 8 hours.   Glucose-Capillary 02/24/2022 207 (H)  70 - 99 mg/dL Final   Glucose reference range applies only to samples taken after fasting for at least 8 hours.   Glucose-Capillary 02/24/2022 109 (H)  70 - 99 mg/dL Final   Glucose reference range applies only to samples taken after fasting for at least 8 hours.   Glucose-Capillary 02/24/2022 127 (H)  70 - 99 mg/dL Final   Glucose reference range applies only to samples taken after fasting for at least 8 hours.   Glucose-Capillary 02/24/2022 130 (H)  70 - 99 mg/dL Final   Glucose reference range applies only to samples taken after fasting for at least 8 hours.   Glucose-Capillary 02/25/2022 117 (H)  70 - 99 mg/dL Final   Glucose reference range applies only to samples taken after fasting for at least 8 hours.   Glucose-Capillary 02/25/2022 104 (H)  70 - 99 mg/dL Final   Glucose reference range applies only to samples taken after fasting for at least 8 hours.   Glucose-Capillary 02/25/2022 180 (H)  70 - 99 mg/dL Final   Glucose reference range applies only to samples taken after fasting for at least 8  hours.   Glucose-Capillary 02/25/2022 180 (H)  70 - 99 mg/dL Final   Glucose reference range applies only to samples taken after fasting for at least 8 hours.   Glucose-Capillary 02/26/2022 133 (H)  70 - 99 mg/dL Final   Glucose reference range applies only to samples taken after fasting for at least 8 hours.   Glucose-Capillary 02/26/2022 85  70 - 99 mg/dL Final   Glucose reference range applies only to samples taken after fasting for at least 8 hours.   Glucose-Capillary 02/26/2022 136 (H)  70 - 99 mg/dL Final   Glucose reference range applies only to samples taken after fasting for at least 8 hours.   Glucose-Capillary 02/26/2022 170 (H)  70 - 99 mg/dL Final   Glucose reference range applies only to samples taken after fasting for at least 8 hours.   Glucose-Capillary 02/26/2022 185 (H)  70 - 99 mg/dL Final   Glucose reference range applies only to samples taken after fasting for at least 8 hours.   Glucose-Capillary 02/27/2022 124 (H)  70 - 99 mg/dL Final   Glucose reference range applies only to samples taken after fasting for at least 8 hours.   Glucose-Capillary 02/27/2022 161 (H)  70 - 99 mg/dL Final   Glucose reference range applies only to samples taken after fasting for at least 8 hours.   Glucose-Capillary 02/27/2022 170 (H)  70 - 99 mg/dL Final   Glucose reference range applies only to samples taken after fasting for at least 8 hours.   Glucose-Capillary 02/27/2022 154 (H)  70 - 99 mg/dL Final   Glucose reference range applies only to samples taken after fasting for at least 8 hours.   Glucose-Capillary 02/28/2022 109 (H)  70 - 99 mg/dL Final   Glucose reference range applies only to samples taken after fasting for at least 8 hours.   Glucose-Capillary 02/28/2022 134 (H)  70 - 99 mg/dL Final   Glucose reference range applies only to samples taken after fasting for at least 8 hours.   Glucose-Capillary 02/28/2022 160 (H)  70 -  99 mg/dL Final   Glucose reference range applies  only to samples taken after fasting for at least 8 hours.   Glucose-Capillary 02/28/2022 136 (H)  70 - 99 mg/dL Final   Glucose reference range applies only to samples taken after fasting for at least 8 hours.   Glucose-Capillary 02/28/2022 173 (H)  70 - 99 mg/dL Final   Glucose reference range applies only to samples taken after fasting for at least 8 hours.   Glucose-Capillary 03/01/2022 134 (H)  70 - 99 mg/dL Final   Glucose reference range applies only to samples taken after fasting for at least 8 hours.   Glucose-Capillary 03/01/2022 116 (H)  70 - 99 mg/dL Final   Glucose reference range applies only to samples taken after fasting for at least 8 hours.   Glucose-Capillary 03/01/2022 227 (H)  70 - 99 mg/dL Final   Glucose reference range applies only to samples taken after fasting for at least 8 hours.   Glucose-Capillary 03/01/2022 157 (H)  70 - 99 mg/dL Final   Glucose reference range applies only to samples taken after fasting for at least 8 hours.   Glucose-Capillary 03/02/2022 99  70 - 99 mg/dL Final   Glucose reference range applies only to samples taken after fasting for at least 8 hours.   WBC 03/02/2022 6.0  4.0 - 10.5 K/uL Final   RBC 03/02/2022 4.15  3.87 - 5.11 MIL/uL Final   Hemoglobin 03/02/2022 9.7 (L)  12.0 - 15.0 g/dL Final   HCT 50/38/8828 31.5 (L)  36.0 - 46.0 % Final   MCV 03/02/2022 75.9 (L)  80.0 - 100.0 fL Final   MCH 03/02/2022 23.4 (L)  26.0 - 34.0 pg Final   MCHC 03/02/2022 30.8  30.0 - 36.0 g/dL Final   RDW 00/34/9179 16.4 (H)  11.5 - 15.5 % Final   Platelets 03/02/2022 395  150 - 400 K/uL Final   nRBC 03/02/2022 0.0  0.0 - 0.2 % Final   Neutrophils Relative % 03/02/2022 61  % Final   Neutro Abs 03/02/2022 3.6  1.7 - 7.7 K/uL Final   Lymphocytes Relative 03/02/2022 29  % Final   Lymphs Abs 03/02/2022 1.7  0.7 - 4.0 K/uL Final   Monocytes Relative 03/02/2022 8  % Final   Monocytes Absolute 03/02/2022 0.5  0.1 - 1.0 K/uL Final   Eosinophils Relative  03/02/2022 1  % Final   Eosinophils Absolute 03/02/2022 0.1  0.0 - 0.5 K/uL Final   Basophils Relative 03/02/2022 1  % Final   Basophils Absolute 03/02/2022 0.1  0.0 - 0.1 K/uL Final   Immature Granulocytes 03/02/2022 0  % Final   Abs Immature Granulocytes 03/02/2022 0.01  0.00 - 0.07 K/uL Final   Performed at [facility], [address] [place], [place] [address]   Glucose-Capillary 03/02/2022 171 (H)  70 - 99 mg/dL Final   Glucose reference range applies only to samples taken after fasting for at least 8 hours.   Glucose-Capillary 03/02/2022 108 (H)  70 - 99 mg/dL Final   Glucose reference range applies only to samples taken after fasting for at least 8 hours.   Glucose-Capillary 03/02/2022 203 (H)  70 - 99 mg/dL Final   Glucose reference range applies only to samples taken after fasting for at least 8 hours.   Glucose-Capillary 03/03/2022 128 (H)  70 - 99 mg/dL Final   Glucose reference range applies only to samples taken after fasting for at least 8 hours.  Glucose-Capillary 03/03/2022 134 (H)  70 - 99 mg/dL Final   Glucose reference range applies only to samples taken after fasting for at least 8 hours.   Glucose-Capillary 03/03/2022 100 (H)  70 - 99 mg/dL Final   Glucose reference range applies only to samples taken after fasting for at least 8 hours.   Glucose-Capillary 03/03/2022 160 (H)  70 - 99 mg/dL Final   Glucose reference range applies only to samples taken after fasting for at least 8 hours.   Glucose-Capillary 03/04/2022 116 (H)  70 - 99 mg/dL Final   Glucose reference range applies only to samples taken after fasting for at least 8 hours.   Glucose-Capillary 03/04/2022 104 (H)  70 - 99 mg/dL Final   Glucose reference range applies only to samples taken after fasting for at least 8 hours.   Glucose-Capillary 03/04/2022 156 (H)  70 - 99 mg/dL Final   Glucose reference range applies only to samples taken after fasting for at least 8 hours.   Glucose-Capillary  03/04/2022 201 (H)  70 - 99 mg/dL Final   Glucose reference range applies only to samples taken after fasting for at least 8 hours.   Glucose-Capillary 03/05/2022 120 (H)  70 - 99 mg/dL Final   Glucose reference range applies only to samples taken after fasting for at least 8 hours.   Glucose-Capillary 03/05/2022 237 (H)  70 - 99 mg/dL Final   Glucose reference range applies only to samples taken after fasting for at least 8 hours.   Glucose-Capillary 03/05/2022 168 (H)  70 - 99 mg/dL Final   Glucose reference range applies only to samples taken after fasting for at least 8 hours.   Glucose-Capillary 03/05/2022 200 (H)  70 - 99 mg/dL Final   Glucose reference range applies only to samples taken after fasting for at least 8 hours.   Glucose-Capillary 03/06/2022 135 (H)  70 - 99 mg/dL Final   Glucose reference range applies only to samples taken after fasting for at least 8 hours.   Glucose-Capillary 03/06/2022 135 (H)  70 - 99 mg/dL Final   Glucose reference range applies only to samples taken after fasting for at least 8 hours.   Glucose-Capillary 03/06/2022 235 (H)  70 - 99 mg/dL Final   Glucose reference range applies only to samples taken after fasting for at least 8 hours.   Glucose-Capillary 03/07/2022 141 (H)  70 - 99 mg/dL Final   Glucose reference range applies only to samples taken after fasting for at least 8 hours.   Glucose-Capillary 03/07/2022 157 (H)  70 - 99 mg/dL Final   Glucose reference range applies only to samples taken after fasting for at least 8 hours.   Glucose-Capillary 03/07/2022 199 (H)  70 - 99 mg/dL Final   Glucose reference range applies only to samples taken after fasting for at least 8 hours.   Glucose-Capillary 03/07/2022 241 (H)  70 - 99 mg/dL Final   Glucose reference range applies only to samples taken after fasting for at least 8 hours.   Glucose-Capillary 03/08/2022 123 (H)  70 - 99 mg/dL Final   Glucose reference range applies only to samples taken  after fasting for at least 8 hours.   Glucose-Capillary 03/06/2022 155 (H)  70 - 99 mg/dL Final   Glucose reference range applies only to samples taken after fasting for at least 8 hours.   Glucose-Capillary 03/08/2022 197 (H)  70 - 99 mg/dL Final   Glucose reference range applies only to  samples taken after fasting for at least 8 hours.   Glucose-Capillary 03/08/2022 158 (H)  70 - 99 mg/dL Final   Glucose reference range applies only to samples taken after fasting for at least 8 hours.  Admission on 02/17/2022, Discharged on 02/18/2022  Component Date Value Ref Range Status   Sodium 02/17/2022 136  135 - 145 mmol/L Final   Potassium 02/17/2022 3.9  3.5 - 5.1 mmol/L Final   Chloride 02/17/2022 101  98 - 111 mmol/L Final   CO2 02/17/2022 25  22 - 32 mmol/L Final   Glucose, Bld 02/17/2022 113 (H)  70 - 99 mg/dL Final   Glucose reference range applies only to samples taken after fasting for at least 8 hours.   BUN 02/17/2022 9  6 - 20 mg/dL Final   Creatinine, Ser 02/17/2022 0.50  0.44 - 1.00 mg/dL Final   Calcium 16/10/960412/20/2023 8.9  8.9 - 10.3 mg/dL Final   GFR, Estimated 02/17/2022 >60  >60 mL/min Final   Comment: (NOTE) Calculated using the CKD-EPI Creatinine Equation (2021)    Anion gap 02/17/2022 10  5 - 15 Final   Performed at [facility], [address] [place], [place] [address]   WBC 02/17/2022 6.5  4.0 - 10.5 K/uL Final   RBC 02/17/2022 4.24  3.87 - 5.11 MIL/uL Final   Hemoglobin 02/17/2022 10.2 (L)  12.0 - 15.0 g/dL Final   HCT 11/91/478212/20/2023 33.2 (L)  36.0 - 46.0 % Final   MCV 02/17/2022 78.3 (L)  80.0 - 100.0 fL Final   MCH 02/17/2022 24.1 (L)  26.0 - 34.0 pg Final   MCHC 02/17/2022 30.7  30.0 - 36.0 g/dL Final   RDW 95/62/130812/20/2023 17.2 (H)  11.5 - 15.5 % Final   Platelets 02/17/2022 390  150 - 400 K/uL Final   nRBC 02/17/2022 0.0  0.0 - 0.2 % Final   Performed at [facility], [address] [place], [place] [address]   Troponin I (High Sensitivity) 02/17/2022 4  <18  ng/L Final   Comment: (NOTE) Elevated high sensitivity troponin I (hsTnI) values and significant  changes across serial measurements may suggest ACS but many other  chronic and acute conditions are known to elevate hsTnI results.  Refer to the "Links" section for chest pain algorithms and additional  guidance. Performed at [facility], [address] [place], [place] [address]    Valproic Acid Lvl 02/17/2022 17 (L)  50.0 - 100.0 ug/mL Final   Performed at [facility], [address] [place], [place] [id number]   B Natriuretic Peptide 02/17/2022 30.9  0.0 - 100.0 pg/mL Final   Performed at [facility], [address] [place], [place] [id number]   Troponin I (High Sensitivity) 02/18/2022 4  <18 ng/L Final   Comment: (NOTE) Elevated high sensitivity troponin I (hsTnI) values and significant  changes across serial measurements may suggest ACS but many other  chronic and acute conditions are known to elevate hsTnI results.  Refer to the "Links" section for chest pain algorithms and additional  guidance. Performed at [facility], [address] [place], [place] [id number]    D-Dimer, Quant 02/18/2022 0.29  0.00 - 0.50 ug/mL-FEU Final   Comment: (NOTE) At the manufacturer cut-off value of 0.5 g/mL FEU, this assay has a negative predictive value of 95-100%.This assay is intended for use in conjunction with a clinical pretest probability (PTP) assessment model to exclude pulmonary embolism (PE)  and deep venous thrombosis (DVT) in outpatients suspected of PE or DVT. Results should be correlated with clinical presentation. Performed at [facility], [address], [place], [place] [address]   Admission on 02/09/2022, Discharged on 02/17/2022  Component Date Value Ref Range Status   Glucose-Capillary 02/09/2022 118 (H)  70 - 99 mg/dL Final   Glucose reference range applies only to samples taken after fasting for at least 8 hours.   Glucose-Capillary  02/10/2022 148 (H)  70 - 99 mg/dL Final   Glucose reference range applies only to samples taken after fasting for at least 8 hours.   Glucose-Capillary 02/10/2022 174 (H)  70 - 99 mg/dL Final   Glucose reference range applies only to samples taken after fasting for at least 8 hours.   Glucose-Capillary 02/10/2022 128 (H)  70 - 99 mg/dL Final   Glucose reference range applies only to samples taken after fasting for at least 8 hours.   Glucose-Capillary 02/10/2022 182 (H)  70 - 99 mg/dL Final   Glucose reference range applies only to samples taken after fasting for at least 8 hours.   Glucose-Capillary 02/11/2022 158 (H)  70 - 99 mg/dL Final   Glucose reference range applies only to samples taken after fasting for at least 8 hours.   Glucose-Capillary 02/11/2022 159 (H)  70 - 99 mg/dL Final   Glucose reference range applies only to samples taken after fasting for at least 8 hours.   Glucose-Capillary 02/11/2022 153 (H)  70 - 99 mg/dL Final   Glucose reference range applies only to samples taken after fasting for at least 8 hours.   Glucose-Capillary 02/11/2022 159 (H)  70 - 99 mg/dL Final   Glucose reference range applies only to samples taken after fasting for at least 8 hours.   Glucose-Capillary 02/11/2022 153 (H)  70 - 99 mg/dL Final   Glucose reference range applies only to samples taken after fasting for at least 8 hours.   Glucose-Capillary 02/11/2022 109 (H)  70 - 99 mg/dL Final   Glucose reference range applies only to samples taken after fasting for at least 8 hours.   Glucose-Capillary 02/11/2022 213 (H)  70 - 99 mg/dL Final   Glucose reference range applies only to samples taken after fasting for at least 8 hours.   Glucose-Capillary 02/11/2022 229 (H)  70 - 99 mg/dL Final   Glucose reference range applies only to samples taken after fasting for at least 8 hours.   Glucose-Capillary 02/12/2022 128 (H)  70 - 99 mg/dL Final   Glucose reference range applies only to samples taken  after fasting for at least 8 hours.   Glucose-Capillary 02/12/2022 149 (H)  70 - 99 mg/dL Final   Glucose reference range applies only to samples taken after fasting for at least 8 hours.   Color, Urine 02/12/2022 YELLOW  YELLOW Final   APPearance 02/12/2022 CLEAR  CLEAR Final   Specific Gravity, Urine 02/12/2022 1.015  1.005 - 1.030 Final   pH 02/12/2022 5.0  5.0 - 8.0 Final   Glucose, UA 02/12/2022 NEGATIVE  NEGATIVE mg/dL Final   Hgb urine dipstick 02/12/2022 NEGATIVE  NEGATIVE Final   Bilirubin Urine 02/12/2022 NEGATIVE  NEGATIVE Final   Ketones, ur 02/12/2022 NEGATIVE  NEGATIVE mg/dL Final   Protein, ur 40/34/7425 NEGATIVE  NEGATIVE mg/dL Final   Nitrite 95/63/8756 NEGATIVE  NEGATIVE Final   Leukocytes,Ua 02/12/2022 NEGATIVE  NEGATIVE Final   Performed at [facility], [address],  [place], [place] [address]   Specimen Description 02/12/2022 URINE, CLEAN CATCH   Final   Special Requests 02/12/2022    Final                   Value:NONE Performed at [facility], [address] [place], [place] [address]    Culture 02/12/2022 10,000 COLONIES/mL PROTEUS MIRABILIS (A)   Final   Report Status 02/12/2022 02/15/2022 FINAL   Final   Organism ID, Bacteria 02/12/2022 PROTEUS MIRABILIS (A)   Final   Glucose-Capillary 02/12/2022 128 (H)  70 - 99 mg/dL Final   Glucose reference range applies only to samples taken after fasting for at least 8 hours.   Glucose-Capillary 02/12/2022 196 (H)  70 - 99 mg/dL Final   Glucose reference range applies only to samples taken after fasting for at least 8 hours.   Glucose-Capillary 02/13/2022 168 (H)  70 - 99 mg/dL Final   Glucose reference range applies only to samples taken after fasting for at least 8 hours.   Glucose-Capillary 02/13/2022 153 (H)  70 - 99 mg/dL Final   Glucose reference range applies only to samples taken after fasting for at least 8 hours.   Glucose-Capillary 02/13/2022 134 (H)  70 - 99 mg/dL Final   Glucose reference  range applies only to samples taken after fasting for at least 8 hours.   Glucose-Capillary 02/13/2022 178 (H)  70 - 99 mg/dL Final   Glucose reference range applies only to samples taken after fasting for at least 8 hours.   Glucose-Capillary 02/14/2022 156 (H)  70 - 99 mg/dL Final   Glucose reference range applies only to samples taken after fasting for at least 8 hours.   Glucose-Capillary 02/14/2022 169 (H)  70 - 99 mg/dL Final   Glucose reference range applies only to samples taken after fasting for at least 8 hours.   Glucose-Capillary 02/14/2022 156 (H)  70 - 99 mg/dL Final   Glucose reference range applies only to samples taken after fasting for at least 8 hours.   Glucose-Capillary 02/14/2022 148 (H)  70 - 99 mg/dL Final   Glucose reference range applies only to samples taken after fasting for at least 8 hours.   Glucose-Capillary 02/15/2022 159 (H)  70 - 99 mg/dL Final   Glucose reference range applies only to samples taken after fasting for at least 8 hours.   Glucose-Capillary 02/15/2022 125 (H)  70 - 99 mg/dL Final   Glucose reference range applies only to samples taken after fasting for at least 8 hours.   Glucose-Capillary 02/15/2022 142 (H)  70 - 99 mg/dL Final   Glucose reference range applies only to samples taken after fasting for at least 8 hours.   Glucose-Capillary 02/15/2022 128 (H)  70 - 99 mg/dL Final   Glucose reference range applies only to samples taken after fasting for at least 8 hours.   Glucose-Capillary 02/16/2022 137 (H)  70 - 99 mg/dL Final   Glucose reference range applies only to samples taken after fasting for at least 8 hours.   WBC 02/16/2022 6.0  4.0 - 10.5 K/uL Final   RBC 02/16/2022 4.48  3.87 - 5.11 MIL/uL Final   Hemoglobin 02/16/2022 10.7 (L)  12.0 - 15.0 g/dL Final   HCT 12/45/8099 35.4 (L)  36.0 - 46.0 % Final   MCV 02/16/2022 79.0 (L)  80.0 - 100.0 fL Final   MCH 02/16/2022 23.9 (L)  26.0 - 34.0 pg Final   MCHC 02/16/2022  30.2  30.0 - 36.0  g/dL Final   RDW 91/47/8295 17.0 (H)  11.5 - 15.5 % Final   Platelets 02/16/2022 387  150 - 400 K/uL Final   nRBC 02/16/2022 0.0  0.0 - 0.2 % Final   Neutrophils Relative % 02/16/2022 54  % Final   Neutro Abs 02/16/2022 3.2  1.7 - 7.7 K/uL Final   Lymphocytes Relative 02/16/2022 35  % Final   Lymphs Abs 02/16/2022 2.1  0.7 - 4.0 K/uL Final   Monocytes Relative 02/16/2022 6  % Final   Monocytes Absolute 02/16/2022 0.4  0.1 - 1.0 K/uL Final   Eosinophils Relative 02/16/2022 2  % Final   Eosinophils Absolute 02/16/2022 0.1  0.0 - 0.5 K/uL Final   Basophils Relative 02/16/2022 2  % Final   Basophils Absolute 02/16/2022 0.1  0.0 - 0.1 K/uL Final   Immature Granulocytes 02/16/2022 1  % Final   Abs Immature Granulocytes 02/16/2022 0.04  0.00 - 0.07 K/uL Final   Performed at [facility], [address], [place], [place] [address]   Glucose-Capillary 02/16/2022 131 (H)  70 - 99 mg/dL Final   Glucose reference range applies only to samples taken after fasting for at least 8 hours.   Glucose-Capillary 02/16/2022 123 (H)  70 - 99 mg/dL Final   Glucose reference range applies only to samples taken after fasting for at least 8 hours.   Glucose-Capillary 02/16/2022 195 (H)  70 - 99 mg/dL Final   Glucose reference range applies only to samples taken after fasting for at least 8 hours.   Glucose-Capillary 02/17/2022 148 (H)  70 - 99 mg/dL Final   Glucose reference range applies only to samples taken after fasting for at least 8 hours.  Admission on 02/08/2022, Discharged on 02/09/2022  Component Date Value Ref Range Status   B Natriuretic Peptide 02/08/2022 40.2  0.0 - 100.0 pg/mL Final   Performed at [facility], [address] [place], [place] [address]   Troponin I (High Sensitivity) 02/08/2022 4  <18 ng/L Final   Comment: (NOTE) Elevated high sensitivity troponin I (hsTnI) values and significant  changes across serial measurements may suggest ACS but many other  chronic and acute  conditions are known to elevate hsTnI results.  Refer to the "Links" section for chest pain algorithms and additional  guidance. Performed at [facility], [address] [place], [place] [address]    WBC 02/08/2022 8.5  4.0 - 10.5 K/uL Final   RBC 02/08/2022 4.30  3.87 - 5.11 MIL/uL Final   Hemoglobin 02/08/2022 10.7 (L)  12.0 - 15.0 g/dL Final   HCT 95/28/4132 33.1 (L)  36.0 - 46.0 % Final   MCV 02/08/2022 77.0 (L)  80.0 - 100.0 fL Final   MCH 02/08/2022 24.9 (L)  26.0 - 34.0 pg Final   MCHC 02/08/2022 32.3  30.0 - 36.0 g/dL Final   RDW 44/03/270 16.5 (H)  11.5 - 15.5 % Final   Platelets 02/08/2022 361  150 - 400 K/uL Final   nRBC 02/08/2022 0.0  0.0 - 0.2 % Final   Performed at [facility], [address], [place], [place] [address]   Sodium 02/08/2022 131 (L)  135 - 145 mmol/L Final   Potassium 02/08/2022 4.4  3.5 - 5.1 mmol/L Final   Chloride 02/08/2022 96 (L)  98 - 111 mmol/L Final   CO2 02/08/2022 25  22 - 32 mmol/L Final   Glucose, Bld 02/08/2022  126 (H)  70 - 99 mg/dL Final   Glucose reference range applies only to samples taken after fasting for at least 8 hours.   BUN 02/08/2022 11  6 - 20 mg/dL Final   Creatinine, Ser 02/08/2022 0.52  0.44 - 1.00 mg/dL Final   Calcium 16/11/9602 9.4  8.9 - 10.3 mg/dL Final   Total Protein 54/10/8117 7.5  6.5 - 8.1 g/dL Final   Albumin 14/78/2956 3.6  3.5 - 5.0 g/dL Final   AST 21/30/8657 22  15 - 41 U/L Final   ALT 02/08/2022 20  0 - 44 U/L Final   Alkaline Phosphatase 02/08/2022 67  38 - 126 U/L Final   Total Bilirubin 02/08/2022 0.1 (L)  0.3 - 1.2 mg/dL Final   GFR, Estimated 02/08/2022 >60  >60 mL/min Final   Comment: (NOTE) Calculated using the CKD-EPI Creatinine Equation (2021)    Anion gap 02/08/2022 10  5 - 15 Final   Performed at [facility], [address], [place], [place] [address]   Troponin I (High Sensitivity) 02/08/2022 5  <18 ng/L Final   Comment: (NOTE) Elevated high sensitivity troponin I  (hsTnI) values and significant  changes across serial measurements may suggest ACS but many other  chronic and acute conditions are known to elevate hsTnI results.  Refer to the "Links" section for chest pain algorithms and additional  guidance. Performed at [facility], [address] [place], [place] [address]    WBC 02/09/2022 7.3  4.0 - 10.5 K/uL Final   RBC 02/09/2022 4.70  3.87 - 5.11 MIL/uL Final   Hemoglobin 02/09/2022 11.4 (L)  12.0 - 15.0 g/dL Final   HCT 41/32/4401 36.6  36.0 - 46.0 % Final   MCV 02/09/2022 77.9 (L)  80.0 - 100.0 fL Final   MCH 02/09/2022 24.3 (L)  26.0 - 34.0 pg Final   MCHC 02/09/2022 31.1  30.0 - 36.0 g/dL Final   RDW 02/72/5366 16.6 (H)  11.5 - 15.5 % Final   Platelets 02/09/2022 403 (H)  150 - 400 K/uL Final   nRBC 02/09/2022 0.0  0.0 - 0.2 % Final   Performed at [facility], [address] [place], [place] [address]   Glucose-Capillary 02/08/2022 117 (H)  70 - 99 mg/dL Final   Glucose reference range applies only to samples taken after fasting for at least 8 hours.   Sodium 02/09/2022 138  135 - 145 mmol/L Final   Potassium 02/09/2022 3.7  3.5 - 5.1 mmol/L Final   Chloride 02/09/2022 103  98 - 111 mmol/L Final   CO2 02/09/2022 24  22 - 32 mmol/L Final   Glucose, Bld 02/09/2022 134 (H)  70 - 99 mg/dL Final   Glucose reference range applies only to samples taken after fasting for at least 8 hours.   BUN 02/09/2022 9  6 - 20 mg/dL Final   Creatinine, Ser 02/09/2022 0.44  0.44 - 1.00 mg/dL Final   Calcium 74/25/9563 8.3 (L)  8.9 - 10.3 mg/dL Final   GFR, Estimated 02/09/2022 >60  >60 mL/min Final   Comment: (NOTE) Calculated using the CKD-EPI Creatinine Equation (2021)    Anion gap 02/09/2022 11  5 - 15 Final   Performed at [facility], [address] [place], [place] [address]   Iron 02/09/2022 20 (L)  28 - 170 ug/dL Final   TIBC 33/29/5188 455 (H)  250 - 450 ug/dL Final   Saturation Ratios 02/09/2022 4 (L)  10.4 - 31.8 %  Final   UIBC 02/09/2022 435  ug/dL Final   Performed at [facility], [address] [place], [place] [address]   Ferritin 02/09/2022 2 (L)  11 - 307 ng/mL Final   Performed at [facility], [address] [place], [place] [address]   Weight 02/09/2022 2,846.58  oz Final   Height 02/09/2022 62  in Final   BP 02/09/2022 143/80  mmHg Final   WBC 02/09/2022 8.1  4.0 - 10.5 K/uL Final   RBC 02/09/2022 4.38  3.87 - 5.11 MIL/uL Final   Hemoglobin 02/09/2022 10.9 (L)  12.0 - 15.0 g/dL Final   HCT 09/81/1914 33.3 (L)  36.0 - 46.0 % Final   MCV 02/09/2022 76.0 (L)  80.0 - 100.0 fL Final   MCH 02/09/2022 24.9 (L)  26.0 - 34.0 pg Final   MCHC 02/09/2022 32.7  30.0 - 36.0 g/dL Final   RDW 78/29/5621 16.5 (H)  11.5 - 15.5 % Final   Platelets 02/09/2022 397  150 - 400 K/uL Final   nRBC 02/09/2022 0.0  0.0 - 0.2 % Final   Neutrophils Relative % 02/09/2022 66  % Final   Neutro Abs 02/09/2022 5.4  1.7 - 7.7 K/uL Final   Lymphocytes Relative 02/09/2022 24  % Final   Lymphs Abs 02/09/2022 1.9  0.7 - 4.0 K/uL Final   Monocytes Relative 02/09/2022 8  % Final   Monocytes Absolute 02/09/2022 0.6  0.1 - 1.0 K/uL Final   Eosinophils Relative 02/09/2022 1  % Final   Eosinophils Absolute 02/09/2022 0.1  0.0 - 0.5 K/uL Final   Basophils Relative 02/09/2022 1  % Final   Basophils Absolute 02/09/2022 0.1  0.0 - 0.1 K/uL Final   Immature Granulocytes 02/09/2022 0  % Final   Abs Immature Granulocytes 02/09/2022 0.03  0.00 - 0.07 K/uL Final   Performed at [facility], [address] [place], [place] [address]   Glucose-Capillary 02/09/2022 238 (H)  70 - 99 mg/dL Final   Glucose reference range applies only to samples taken after fasting for at least 8 hours.   Glucose-Capillary 02/09/2022 91  70 - 99 mg/dL Final   Glucose reference range applies only to samples taken after fasting for at least 8 hours.   CRP 02/09/2022 <0.5  <1.0 mg/dL Final   Performed at [facility], [address] [place], [place] [address]   Sed Rate 02/09/2022 15  0 - 22 mm/hr Final   Performed at [facility], [address] [place], [place] [address]   Glucose-Capillary 02/09/2022 137 (H)  70 - 99 mg/dL Final   Glucose reference range applies only to samples taken after fasting for at least 8 hours.  Admission on 01/24/2022, Discharged on 02/08/2022  Component Date Value Ref Range Status   Glucose-Capillary 01/24/2022 143 (H)  70 - 99 mg/dL Final   Glucose reference range applies only to samples taken after fasting for at least 8 hours.   Glucose-Capillary 01/24/2022 120 (H)  70 - 99 mg/dL Final   Glucose reference range applies only to samples taken after fasting for at least 8 hours.   Glucose-Capillary 01/25/2022 122 (H)  70 - 99 mg/dL Final   Glucose reference range applies only to samples taken after fasting for at least 8 hours.   Glucose-Capillary 01/25/2022 190 (H)  70 - 99 mg/dL Final   Glucose reference range applies only to samples taken after  fasting for at least 8 hours.   Glucose-Capillary 01/25/2022 163 (H)  70 - 99 mg/dL Final   Glucose reference range applies only to samples taken after fasting for at least 8 hours.   WBC 01/26/2022 6.5  4.0 - 10.5 K/uL Final   RBC 01/26/2022 4.53  3.87 - 5.11 MIL/uL Final   Hemoglobin 01/26/2022 11.3 (L)  12.0 - 15.0 g/dL Final   HCT 82/50/5397 36.4  36.0 - 46.0 % Final   MCV 01/26/2022 80.4  80.0 - 100.0 fL Final   MCH 01/26/2022 24.9 (L)  26.0 - 34.0 pg Final   MCHC 01/26/2022 31.0  30.0 - 36.0 g/dL Final   RDW 67/34/1937 17.3 (H)  11.5 - 15.5 % Final   Platelets 01/26/2022 327  150 - 400 K/uL Final   nRBC 01/26/2022 0.0  0.0 - 0.2 % Final   Neutrophils Relative % 01/26/2022 60  % Final   Neutro Abs 01/26/2022 3.9  1.7 - 7.7 K/uL Final   Lymphocytes Relative 01/26/2022 31  % Final   Lymphs Abs 01/26/2022 2.0  0.7 - 4.0 K/uL Final   Monocytes Relative 01/26/2022 7  % Final   Monocytes Absolute 01/26/2022 0.5  0.1 - 1.0 K/uL  Final   Eosinophils Relative 01/26/2022 1  % Final   Eosinophils Absolute 01/26/2022 0.1  0.0 - 0.5 K/uL Final   Basophils Relative 01/26/2022 1  % Final   Basophils Absolute 01/26/2022 0.1  0.0 - 0.1 K/uL Final   Immature Granulocytes 01/26/2022 0  % Final   Abs Immature Granulocytes 01/26/2022 0.02  0.00 - 0.07 K/uL Final   Performed at [facility], [address], [place], [place] [address]   Glucose-Capillary 01/26/2022 149 (H)  70 - 99 mg/dL Final   Glucose reference range applies only to samples taken after fasting for at least 8 hours.   Glucose-Capillary 01/26/2022 130 (H)  70 - 99 mg/dL Final   Glucose reference range applies only to samples taken after fasting for at least 8 hours.   Glucose-Capillary 01/26/2022 151 (H)  70 - 99 mg/dL Final   Glucose reference range applies only to samples taken after fasting for at least 8 hours.   Glucose-Capillary 01/26/2022 170 (H)  70 - 99 mg/dL Final   Glucose reference range applies only to samples taken after fasting for at least 8 hours.   Glucose-Capillary 01/27/2022 129 (H)  70 - 99 mg/dL Final   Glucose reference range applies only to samples taken after fasting for at least 8 hours.   Glucose-Capillary 01/27/2022 101 (H)  70 - 99 mg/dL Final   Glucose reference range applies only to samples taken after fasting for at least 8 hours.   Glucose-Capillary 01/27/2022 220 (H)  70 - 99 mg/dL Final   Glucose reference range applies only to samples taken after fasting for at least 8 hours.   Glucose-Capillary 01/27/2022 154 (H)  70 - 99 mg/dL Final   Glucose reference range applies only to samples taken after fasting for at least 8 hours.   Glucose-Capillary 01/27/2022 163 (H)  70 - 99 mg/dL Final   Glucose reference range applies only to samples taken after fasting for at least 8 hours.   Glucose-Capillary 01/28/2022 127 (H)  70 - 99 mg/dL Final   Glucose reference range applies only to samples taken after fasting for at least 8  hours.   Glucose-Capillary 01/28/2022 110 (H)  70 - 99 mg/dL Final   Glucose reference range  applies only to samples taken after fasting for at least 8 hours.   Glucose-Capillary 01/28/2022 167 (H)  70 - 99 mg/dL Final   Glucose reference range applies only to samples taken after fasting for at least 8 hours.   Glucose-Capillary 01/29/2022 145 (H)  70 - 99 mg/dL Final   Glucose reference range applies only to samples taken after fasting for at least 8 hours.   Glucose-Capillary 01/29/2022 203 (H)  70 - 99 mg/dL Final   Glucose reference range applies only to samples taken after fasting for at least 8 hours.   Glucose-Capillary 01/29/2022 153 (H)  70 - 99 mg/dL Final   Glucose reference range applies only to samples taken after fasting for at least 8 hours.   Glucose-Capillary 01/29/2022 166 (H)  70 - 99 mg/dL Final   Glucose reference range applies only to samples taken after fasting for at least 8 hours.   Glucose-Capillary 01/30/2022 142 (H)  70 - 99 mg/dL Final   Glucose reference range applies only to samples taken after fasting for at least 8 hours.   Glucose-Capillary 01/30/2022 138 (H)  70 - 99 mg/dL Final   Glucose reference range applies only to samples taken after fasting for at least 8 hours.   Glucose-Capillary 01/30/2022 185 (H)  70 - 99 mg/dL Final   Glucose reference range applies only to samples taken after fasting for at least 8 hours.   Glucose-Capillary 01/30/2022 220 (H)  70 - 99 mg/dL Final   Glucose reference range applies only to samples taken after fasting for at least 8 hours.   Glucose-Capillary 01/31/2022 147 (H)  70 - 99 mg/dL Final   Glucose reference range applies only to samples taken after fasting for at least 8 hours.   Glucose-Capillary 01/31/2022 131 (H)  70 - 99 mg/dL Final   Glucose reference range applies only to samples taken after fasting for at least 8 hours.   Glucose-Capillary 01/31/2022 169 (H)  70 - 99 mg/dL Final   Glucose reference range  applies only to samples taken after fasting for at least 8 hours.   Glucose-Capillary 01/31/2022 144 (H)  70 - 99 mg/dL Final   Glucose reference range applies only to samples taken after fasting for at least 8 hours.   Comment 1 01/31/2022 RN AWARE   Final   Glucose-Capillary 02/01/2022 117 (H)  70 - 99 mg/dL Final   Glucose reference range applies only to samples taken after fasting for at least 8 hours.   Glucose-Capillary 02/01/2022 180 (H)  70 - 99 mg/dL Final   Glucose reference range applies only to samples taken after fasting for at least 8 hours.   Glucose-Capillary 02/01/2022 209 (H)  70 - 99 mg/dL Final   Glucose reference range applies only to samples taken after fasting for at least 8 hours.   Glucose-Capillary 02/02/2022 131 (H)  70 - 99 mg/dL Final   Glucose reference range applies only to samples taken after fasting for at least 8 hours.   WBC 02/02/2022 7.5  4.0 - 10.5 K/uL Final   RBC 02/02/2022 4.52  3.87 - 5.11 MIL/uL Final   Hemoglobin 02/02/2022 11.3 (L)  12.0 - 15.0 g/dL Final   HCT 16/11/9602 35.0 (L)  36.0 - 46.0 % Final   MCV 02/02/2022 77.4 (L)  80.0 - 100.0 fL Final   MCH 02/02/2022 25.0 (L)  26.0 - 34.0 pg Final   MCHC 02/02/2022 32.3  30.0 - 36.0 g/dL Final   RDW  02/02/2022 16.7 (H)  11.5 - 15.5 % Final   Platelets 02/02/2022 345  150 - 400 K/uL Final   nRBC 02/02/2022 0.0  0.0 - 0.2 % Final   Neutrophils Relative % 02/02/2022 63  % Final   Neutro Abs 02/02/2022 4.7  1.7 - 7.7 K/uL Final   Lymphocytes Relative 02/02/2022 28  % Final   Lymphs Abs 02/02/2022 2.1  0.7 - 4.0 K/uL Final   Monocytes Relative 02/02/2022 7  % Final   Monocytes Absolute 02/02/2022 0.5  0.1 - 1.0 K/uL Final   Eosinophils Relative 02/02/2022 1  % Final   Eosinophils Absolute 02/02/2022 0.1  0.0 - 0.5 K/uL Final   Basophils Relative 02/02/2022 1  % Final   Basophils Absolute 02/02/2022 0.1  0.0 - 0.1 K/uL Final   Immature Granulocytes 02/02/2022 0  % Final   Abs Immature  Granulocytes 02/02/2022 0.03  0.00 - 0.07 K/uL Final   Performed at [facility], [address] [place], [place] [address]   Glucose-Capillary 02/02/2022 95  70 - 99 mg/dL Final   Glucose reference range applies only to samples taken after fasting for at least 8 hours.   Glucose-Capillary 02/02/2022 120 (H)  70 - 99 mg/dL Final   Glucose reference range applies only to samples taken after fasting for at least 8 hours.   Glucose-Capillary 02/02/2022 191 (H)  70 - 99 mg/dL Final   Glucose reference range applies only to samples taken after fasting for at least 8 hours.   Glucose-Capillary 02/03/2022 148 (H)  70 - 99 mg/dL Final   Glucose reference range applies only to samples taken after fasting for at least 8 hours.   Glucose-Capillary 02/03/2022 122 (H)  70 - 99 mg/dL Final   Glucose reference range applies only to samples taken after fasting for at least 8 hours.   Glucose-Capillary 02/03/2022 233 (H)  70 - 99 mg/dL Final   Glucose reference range applies only to samples taken after fasting for at least 8 hours.   Glucose-Capillary 02/04/2022 126 (H)  70 - 99 mg/dL Final   Glucose reference range applies only to samples taken after fasting for at least 8 hours.   Glucose-Capillary 02/04/2022 117 (H)  70 - 99 mg/dL Final   Glucose reference range applies only to samples taken after fasting for at least 8 hours.   Glucose-Capillary 02/04/2022 135 (H)  70 - 99 mg/dL Final   Glucose reference range applies only to samples taken after fasting for at least 8 hours.   Glucose-Capillary 02/04/2022 197 (H)  70 - 99 mg/dL Final   Glucose reference range applies only to samples taken after fasting for at least 8 hours.   Glucose-Capillary 02/05/2022 170 (H)  70 - 99 mg/dL Final   Glucose reference range applies only to samples taken after fasting for at least 8 hours.   Glucose-Capillary 02/05/2022 107 (H)  70 - 99 mg/dL Final   Glucose reference range applies only to samples taken after  fasting for at least 8 hours.   Glucose-Capillary 02/05/2022 184 (H)  70 - 99 mg/dL Final   Glucose reference range applies only to samples taken after fasting for at least 8 hours.   Glucose-Capillary 02/05/2022 256 (H)  70 - 99 mg/dL Final   Glucose reference range applies only to samples taken after fasting for at least 8 hours.   Glucose-Capillary 02/06/2022 144 (H)  70 - 99 mg/dL Final   Glucose reference range applies only  to samples taken after fasting for at least 8 hours.   Glucose-Capillary 02/06/2022 190 (H)  70 - 99 mg/dL Final   Glucose reference range applies only to samples taken after fasting for at least 8 hours.   Glucose-Capillary 02/06/2022 92  70 - 99 mg/dL Final   Glucose reference range applies only to samples taken after fasting for at least 8 hours.   Trichomonas 02/06/2022 Negative   Final   Bacterial Vaginitis-Urine 02/06/2022 Negative   Final   Molecular Comment 02/06/2022 For tests bacteria and/or candida, this specimen does not meet the   Final   Molecular Comment 02/06/2022 strict criteria set by the FDA. The result interpretation should be   Final   Molecular Comment 02/06/2022 considered in conjunction with the patient's clinical history.   Final   Comment 02/06/2022 Normal Reference Range Trichomonas - Negative   Final   Glucose-Capillary 02/06/2022 197 (H)  70 - 99 mg/dL Final   Glucose reference range applies only to samples taken after fasting for at least 8 hours.   Glucose-Capillary 02/07/2022 129 (H)  70 - 99 mg/dL Final   Glucose reference range applies only to samples taken after fasting for at least 8 hours.   Glucose-Capillary 02/07/2022 207 (H)  70 - 99 mg/dL Final   Glucose reference range applies only to samples taken after fasting for at least 8 hours.   Glucose-Capillary 02/07/2022 153 (H)  70 - 99 mg/dL Final   Glucose reference range applies only to samples taken after fasting for at least 8 hours.   Glucose-Capillary 02/08/2022 129 (H)  70  - 99 mg/dL Final   Glucose reference range applies only to samples taken after fasting for at least 8 hours.   Glucose-Capillary 02/08/2022 107 (H)  70 - 99 mg/dL Final   Glucose reference range applies only to samples taken after fasting for at least 8 hours.  Admission on 01/23/2022, Discharged on 01/24/2022  Component Date Value Ref Range Status   Sodium 01/23/2022 140  135 - 145 mmol/L Final   Potassium 01/23/2022 4.4  3.5 - 5.1 mmol/L Final   Chloride 01/23/2022 101  98 - 111 mmol/L Final   CO2 01/23/2022 25  22 - 32 mmol/L Final   Glucose, Bld 01/23/2022 198 (H)  70 - 99 mg/dL Final   Glucose reference range applies only to samples taken after fasting for at least 8 hours.   BUN 01/23/2022 13  6 - 20 mg/dL Final   Creatinine, Ser 01/23/2022 0.62  0.44 - 1.00 mg/dL Final   Calcium 40/98/1191 9.3  8.9 - 10.3 mg/dL Final   GFR, Estimated 01/23/2022 >60  >60 mL/min Final   Comment: (NOTE) Calculated using the CKD-EPI Creatinine Equation (2021)    Anion gap 01/23/2022 14  5 - 15 Final   Performed at [facility], [address], [place], [place] [address]   WBC 01/23/2022 5.8  4.0 - 10.5 K/uL Final   RBC 01/23/2022 4.63  3.87 - 5.11 MIL/uL Final   Hemoglobin 01/23/2022 11.7 (L)  12.0 - 15.0 g/dL Final   HCT 56/21/3086 37.4  36.0 - 46.0 % Final   MCV 01/23/2022 80.8  80.0 - 100.0 fL Final   MCH 01/23/2022 25.3 (L)  26.0 - 34.0 pg Final   MCHC 01/23/2022 31.3  30.0 - 36.0 g/dL Final   RDW 57/84/6962 17.9 (H)  11.5 - 15.5 % Final   Platelets 01/23/2022 333  150 - 400 K/uL Final  nRBC 01/23/2022 0.0  0.0 - 0.2 % Final   Performed at [facility], [address] [place], [place] [address]   Troponin I (High Sensitivity) 01/23/2022 6  <18 ng/L Final   Comment: (NOTE) Elevated high sensitivity troponin I (hsTnI) values and significant  changes across serial measurements may suggest ACS but many other  chronic and acute conditions are known to elevate hsTnI results.  Refer  to the "Links" section for chest pain algorithms and additional  guidance. Performed at [facility], [address], [place], [place] [address]    Troponin I (High Sensitivity) 01/23/2022 5  <18 ng/L Final   Comment: (NOTE) Elevated high sensitivity troponin I (hsTnI) values and significant  changes across serial measurements may suggest ACS but many other  chronic and acute conditions are known to elevate hsTnI results.  Refer to the "Links" section for chest pain algorithms and additional  guidance. Performed at [facility], [address] [place], [place] [address]    SARS Coronavirus 2 by RT PCR 01/23/2022 NEGATIVE  NEGATIVE Final   Comment: (NOTE) SARS-CoV-2 target nucleic acids are NOT DETECTED.  The SARS-CoV-2 RNA is generally detectable in upper and lower respiratory specimens during the acute phase of infection. The lowest concentration of SARS-CoV-2 viral copies this assay can detect is 250 copies / mL. A negative result does not preclude SARS-CoV-2 infection and should not be used as the sole basis for treatment or other patient management decisions.  A negative result may occur with improper specimen collection / handling, submission of specimen other than nasopharyngeal swab, presence of viral mutation(s) within the areas targeted by this assay, and inadequate number of viral copies (<250 copies / mL). A negative result must be combined with clinical observations, patient history, and epidemiological information.  Fact Sheet for Patients:   RoadLapTop.co.za  Fact Sheet for Healthcare Providers: http://kim-miller.com/  This test is not yet approved or                           cleared by the [place] FDA and has been authorized for detection and/or diagnosis of SARS-CoV-2 by FDA under an Emergency Use Authorization (EUA).  This EUA will remain in effect (meaning this test can be used) for the duration  of the COVID-19 declaration under Section 564(b)(1) of the Act, 21 U.S.C. section 360bbb-3(b)(1), unless the authorization is terminated or revoked sooner.  Performed at [facility], [address], [place], [place] [address]    B Natriuretic Peptide 01/23/2022 41.3  0.0 - 100.0 pg/mL Final   Performed at [facility], [address] [place], [place] [address]   Group A Strep by PCR 01/23/2022 NOT DETECTED  NOT DETECTED Final   Performed at [facility], [address], [place], [place] [address]   Color, Urine 01/23/2022 YELLOW  YELLOW Final   APPearance 01/23/2022 CLEAR  CLEAR Final   Specific Gravity, Urine 01/23/2022 1.018  1.005 - 1.030 Final   pH 01/23/2022 7.0  5.0 - 8.0 Final   Glucose, UA 01/23/2022 NEGATIVE  NEGATIVE mg/dL Final   Hgb urine dipstick 01/23/2022 NEGATIVE  NEGATIVE Final   Bilirubin Urine 01/23/2022 NEGATIVE  NEGATIVE Final   Ketones, ur 01/23/2022 NEGATIVE  NEGATIVE mg/dL Final   Protein, ur 16/03/930 NEGATIVE  NEGATIVE mg/dL Final   Nitrite 35/57/3220 NEGATIVE  NEGATIVE Final   Leukocytes,Ua 01/23/2022 TRACE (A)  NEGATIVE  Final   RBC / HPF 01/23/2022 0-5  0 - 5 RBC/hpf Final   WBC, UA 01/23/2022 0-5  0 - 5 WBC/hpf Final   Bacteria, UA 01/23/2022 NONE SEEN  NONE SEEN Final   Squamous Epithelial / HPF 01/23/2022 0-5  0 - 5 Final   Mucus 01/23/2022 PRESENT   Final   Performed at [facility], [place] [address], [place], [place] [address]   SARS Coronavirus 2 by RT PCR 01/23/2022 NEGATIVE  NEGATIVE Final   Comment: (NOTE) SARS-CoV-2 target nucleic acids are NOT DETECTED.  The SARS-CoV-2 RNA is generally detectable in upper respiratory specimens during the acute phase of infection. The lowest concentration of SARS-CoV-2 viral copies this assay can detect is 138 copies/mL. A negative result does not preclude SARS-Cov-2 infection and should not be used as the sole basis for treatment or other patient management decisions. A negative  result may occur with  improper specimen collection/handling, submission of specimen other than nasopharyngeal swab, presence of viral mutation(s) within the areas targeted by this assay, and inadequate number of viral copies(<138 copies/mL). A negative result must be combined with clinical observations, patient history, and epidemiological information. The expected result is Negative.  Fact Sheet for Patients:  EntrepreneurPulse.com.au  Fact Sheet for Healthcare Providers:  IncredibleEmployment.be  This test is no                          t yet approved or cleared by the [place] FDA and  has been authorized for detection and/or diagnosis of SARS-CoV-2 by FDA under an Emergency Use Authorization (EUA). This EUA will remain  in effect (meaning this test can be used) for the duration of the COVID-19 declaration under Section 564(b)(1) of the Act, 21 U.S.C.section 360bbb-3(b)(1), unless the authorization is terminated  or revoked sooner.       Influenza A by PCR 01/23/2022 NEGATIVE  NEGATIVE Final   Influenza B by PCR 01/23/2022 NEGATIVE  NEGATIVE Final   Comment: (NOTE) The Xpert Xpress SARS-CoV-2/FLU/RSV plus assay is intended as an aid in the diagnosis of influenza from Nasopharyngeal swab specimens and should not be used as a sole basis for treatment. Nasal washings and aspirates are unacceptable for Xpert Xpress SARS-CoV-2/FLU/RSV testing.  Fact Sheet for Patients: EntrepreneurPulse.com.au  Fact Sheet for Healthcare Providers: IncredibleEmployment.be  This test is not yet approved or cleared by the [place] FDA and has been authorized for detection and/or diagnosis of SARS-CoV-2 by FDA under an Emergency Use Authorization (EUA). This EUA will remain in effect (meaning this test can be used) for the duration of the COVID-19 declaration under Section 564(b)(1) of the Act, 21 U.S.C. section  360bbb-3(b)(1), unless the authorization is terminated or revoked.  Performed at [facility], [place] [address] [place], [place] [address]    WBC 01/24/2022 6.1  4.0 - 10.5 K/uL Final   RBC 01/24/2022 4.60  3.87 - 5.11 MIL/uL Final   Hemoglobin 01/24/2022 11.7 (L)  12.0 - 15.0 g/dL Final   HCT 01/24/2022 37.0  36.0 - 46.0 % Final   MCV 01/24/2022 80.4  80.0 - 100.0 fL Final   MCH 01/24/2022 25.4 (L)  26.0 - 34.0 pg Final   MCHC 01/24/2022 31.6  30.0 - 36.0 g/dL Final   RDW 01/24/2022 17.6 (H)  11.5 - 15.5 % Final   Platelets 01/24/2022 334  150 - 400 K/uL Final   nRBC 01/24/2022 0.0  0.0 - 0.2 %  Final   Neutrophils Relative % 01/24/2022 53  % Final   Neutro Abs 01/24/2022 3.3  1.7 - 7.7 K/uL Final   Lymphocytes Relative 01/24/2022 33  % Final   Lymphs Abs 01/24/2022 2.0  0.7 - 4.0 K/uL Final   Monocytes Relative 01/24/2022 11  % Final   Monocytes Absolute 01/24/2022 0.7  0.1 - 1.0 K/uL Final   Eosinophils Relative 01/24/2022 2  % Final   Eosinophils Absolute 01/24/2022 0.1  0.0 - 0.5 K/uL Final   Basophils Relative 01/24/2022 1  % Final   Basophils Absolute 01/24/2022 0.1  0.0 - 0.1 K/uL Final   Immature Granulocytes 01/24/2022 0  % Final   Abs Immature Granulocytes 01/24/2022 0.02  0.00 - 0.07 K/uL Final   Performed at [facility], [address] [place], [place] [address]   Glucose-Capillary 01/24/2022 110 (H)  70 - 99 mg/dL Final   Glucose reference range applies only to samples taken after fasting for at least 8 hours.  No results displayed because visit has over 200 results.    Admission on 11/22/2021, Discharged on 11/22/2021  Component Date Value Ref Range Status   Glucose-Capillary 11/22/2021 169 (H)  70 - 99 mg/dL Final   Glucose reference range applies only to samples taken after fasting for at least 8 hours.  No results displayed because visit has over 200 results.    Admission on 10/04/2021, Discharged on 10/22/2021  Component Date Value Ref Range  Status   SARS Coronavirus 2 by RT PCR 10/04/2021 NEGATIVE  NEGATIVE Final   Comment: (NOTE) SARS-CoV-2 target nucleic acids are NOT DETECTED.  The SARS-CoV-2 RNA is generally detectable in upper respiratory specimens during the acute phase of infection. The lowest concentration of SARS-CoV-2 viral copies this assay can detect is 138 copies/mL. A negative result does not preclude SARS-Cov-2 infection and should not be used as the sole basis for treatment or other patient management decisions. A negative result may occur with  improper specimen collection/handling, submission of specimen other than nasopharyngeal swab, presence of viral mutation(s) within the areas targeted by this assay, and inadequate number of viral copies(<138 copies/mL). A negative result must be combined with clinical observations, patient history, and epidemiological information. The expected result is Negative.  Fact Sheet for Patients:  BloggerCourse.comhttps://www.fda.gov/media/152166/download  Fact Sheet for Healthcare Providers:  SeriousBroker.ithttps://www.fda.gov/media/152162/download  This test is no                          t yet approved or cleared by the [place] FDA and  has been authorized for detection and/or diagnosis of SARS-CoV-2 by FDA under an Emergency Use Authorization (EUA). This EUA will remain  in effect (meaning this test can be used) for the duration of the COVID-19 declaration under Section 564(b)(1) of the Act, 21 U.S.C.section 360bbb-3(b)(1), unless the authorization is terminated  or revoked sooner.       Influenza A by PCR 10/04/2021 NEGATIVE  NEGATIVE Final   Influenza B by PCR 10/04/2021 NEGATIVE  NEGATIVE Final   Comment: (NOTE) The Xpert Xpress SARS-CoV-2/FLU/RSV plus assay is intended as an aid in the diagnosis of influenza from Nasopharyngeal swab specimens and should not be used as a sole basis for treatment. Nasal washings and aspirates are unacceptable for Xpert Xpress  SARS-CoV-2/FLU/RSV testing.  Fact Sheet for Patients: BloggerCourse.comhttps://www.fda.gov/media/152166/download  Fact Sheet for Healthcare Providers: SeriousBroker.ithttps://www.fda.gov/media/152162/download  This test is not yet approved or cleared by the [place] FDA  and has been authorized for detection and/or diagnosis of SARS-CoV-2 by FDA under an Emergency Use Authorization (EUA). This EUA will remain in effect (meaning this test can be used) for the duration of the COVID-19 declaration under Section 564(b)(1) of the Act, 21 U.S.C. section 360bbb-3(b)(1), unless the authorization is terminated or revoked.  Performed at [name], [address], [place], [place] [address]    WBC 10/04/2021 8.4  4.0 - 10.5 K/uL Final   RBC 10/04/2021 4.42  3.87 - 5.11 MIL/uL Final   Hemoglobin 10/04/2021 11.6 (L)  12.0 - 15.0 g/dL Final   HCT 60/45/4098 35.7 (L)  36.0 - 46.0 % Final   MCV 10/04/2021 80.8  80.0 - 100.0 fL Final   MCH 10/04/2021 26.2  26.0 - 34.0 pg Final   MCHC 10/04/2021 32.5  30.0 - 36.0 g/dL Final   RDW 11/91/4782 16.0 (H)  11.5 - 15.5 % Final   Platelets 10/04/2021 372  150 - 400 K/uL Final   nRBC 10/04/2021 0.0  0.0 - 0.2 % Final   Neutrophils Relative % 10/04/2021 68  % Final   Neutro Abs 10/04/2021 5.7  1.7 - 7.7 K/uL Final   Lymphocytes Relative 10/04/2021 22  % Final   Lymphs Abs 10/04/2021 1.8  0.7 - 4.0 K/uL Final   Monocytes Relative 10/04/2021 8  % Final   Monocytes Absolute 10/04/2021 0.7  0.1 - 1.0 K/uL Final   Eosinophils Relative 10/04/2021 1  % Final   Eosinophils Absolute 10/04/2021 0.1  0.0 - 0.5 K/uL Final   Basophils Relative 10/04/2021 1  % Final   Basophils Absolute 10/04/2021 0.1  0.0 - 0.1 K/uL Final   Immature Granulocytes 10/04/2021 0  % Final   Abs Immature Granulocytes 10/04/2021 0.03  0.00 - 0.07 K/uL Final   Performed at [facility], [address], [place], [place] [address]   Sodium 10/04/2021 136  135 - 145 mmol/L Final   Potassium 10/04/2021  4.2  3.5 - 5.1 mmol/L Final   Chloride 10/04/2021 104  98 - 111 mmol/L Final   CO2 10/04/2021 25  22 - 32 mmol/L Final   Glucose, Bld 10/04/2021 100 (H)  70 - 99 mg/dL Final   Glucose reference range applies only to samples taken after fasting for at least 8 hours.   BUN 10/04/2021 9  6 - 20 mg/dL Final   Creatinine, Ser 10/04/2021 0.52  0.44 - 1.00 mg/dL Final   Calcium 30/86/5784 9.0  8.9 - 10.3 mg/dL Final   Total Protein 69/62/9528 7.0  6.5 - 8.1 g/dL Final   Albumin 41/32/4401 3.2 (L)  3.5 - 5.0 g/dL Final   AST 02/72/5366 13 (L)  15 - 41 U/L Final   ALT 10/04/2021 10  0 - 44 U/L Final   Alkaline Phosphatase 10/04/2021 61  38 - 126 U/L Final   Total Bilirubin 10/04/2021 0.3  0.3 - 1.2 mg/dL Final   GFR, Estimated 10/04/2021 >60  >60 mL/min Final   Comment: (NOTE) Calculated using the CKD-EPI Creatinine Equation (2021)    Anion gap 10/04/2021 7  5 - 15 Final   Performed at [facility], [address] [place], [place] [address]   Hgb A1c MFr Bld 10/04/2021 7.0 (H)  4.8 - 5.6 % Final   Comment: (NOTE) Pre diabetes:          5.7%-6.4%  Diabetes:              >6.4%  Glycemic control for   <7.0% adults with diabetes    Mean Plasma Glucose 10/04/2021 154.2  mg/dL Final   Performed at [facility], [address], [place], [place] [address]   Cholesterol 10/04/2021 178  0 - 200 mg/dL Final   Triglycerides 60/45/4098 155 (H)  <150 mg/dL Final   HDL 11/91/4782 52  >40 mg/dL Final   Total CHOL/HDL Ratio 10/04/2021 3.4  RATIO Final   VLDL 10/04/2021 31  0 - 40 mg/dL Final   LDL Cholesterol 10/04/2021 95  0 - 99 mg/dL Final   Comment:        Total Cholesterol/HDL:CHD Risk Coronary Heart Disease Risk Table                     Men   Women  1/2 Average Risk   3.4   3.3  Average Risk       5.0   4.4  2 X Average Risk   9.6   7.1  3 X Average Risk  23.4   11.0        Use the calculated Patient Ratio above and the CHD Risk Table to determine the patient's CHD Risk.         ATP III CLASSIFICATION (LDL):  <100     mg/dL   Optimal  956-213  mg/dL   Near or Above                    Optimal  130-159  mg/dL   Borderline  086-578  mg/dL   High  >469     mg/dL   Very High Performed at [facility], [address], [place], [place] [address]    POC Amphetamine UR 10/04/2021 None Detected  NONE DETECTED (Cut Off Level 1000 ng/mL) Final   POC Secobarbital (BAR) 10/04/2021 None Detected  NONE DETECTED (Cut Off Level 300 ng/mL) Final   POC Buprenorphine (BUP) 10/04/2021 None Detected  NONE DETECTED (Cut Off Level 10 ng/mL) Final   POC Oxazepam (BZO) 10/04/2021 None Detected  NONE DETECTED (Cut Off Level 300 ng/mL) Final   POC Cocaine UR 10/04/2021 None Detected  NONE DETECTED (Cut Off Level 300 ng/mL) Final   POC Methamphetamine UR 10/04/2021 None Detected  NONE DETECTED (Cut Off Level 1000 ng/mL) Final   POC Morphine 10/04/2021 None Detected  NONE DETECTED (Cut Off Level 300 ng/mL) Final   POC Methadone UR 10/04/2021 None Detected  NONE DETECTED (Cut Off Level 300 ng/mL) Final   POC Oxycodone UR 10/04/2021 Positive (A)  NONE DETECTED (Cut Off Level 100 ng/mL) Final   POC Marijuana UR 10/04/2021 None Detected  NONE DETECTED (Cut Off Level 50 ng/mL) Final   SARSCOV2ONAVIRUS 2 AG 10/04/2021 NEGATIVE  NEGATIVE Final   Comment: (NOTE) SARS-CoV-2 antigen NOT DETECTED.   Negative results are presumptive.  Negative results do not preclude SARS-CoV-2 infection and should not be used as the sole basis for treatment or other patient management decisions, including infection  control decisions, particularly in the presence of clinical signs and  symptoms consistent with COVID-19, or in those who have been in contact with the virus.  Negative results must be combined with clinical observations, patient history, and epidemiological information. The expected result is Negative.  Fact Sheet for Patients: https://www.jennings-kim.com/  Fact Sheet for  Healthcare Providers: https://alexander-rogers.biz/  This test is not yet approved or cleared by the [place] FDA and  has been authorized for detection and/or diagnosis  of SARS-CoV-2 by FDA under an Emergency Use Authorization (EUA).  This EUA will remain in effect (meaning this test can be used) for the duration of  the COV                          ID-19 declaration under Section 564(b)(1) of the Act, 21 U.S.C. section 360bbb-3(b)(1), unless the authorization is terminated or revoked sooner.     Valproic Acid Lvl 10/04/2021 51  50.0 - 100.0 ug/mL Final   Performed at [facility], [address] [place], [place] [address]   Valproic Acid Lvl 10/08/2021 57  50.0 - 100.0 ug/mL Final   Performed at [facility], [address] [place], [place] [address]   Glucose-Capillary 10/09/2021 123 (H)  70 - 99 mg/dL Final   Glucose reference range applies only to samples taken after fasting for at least 8 hours.  There may be more visits with results that are not included.    Blood Alcohol level:  Lab Results  Component Value Date   ETH <10 11/23/2021   ETH <10 11/08/2021    Metabolic Disorder Labs: Lab Results  Component Value Date   HGBA1C 6.7 (H) 12/28/2021   MPG 145.59 12/28/2021   MPG 154.2 10/04/2021   Lab Results  Component Value Date   PROLACTIN 3.8 (L) 11/23/2021   Lab Results  Component Value Date   CHOL 171 11/23/2021   TRIG 125 11/23/2021   HDL 65 11/23/2021   CHOLHDL 2.6 11/23/2021   VLDL 25 11/23/2021   LDLCALC 81 11/23/2021   LDLCALC 95 10/04/2021    Therapeutic Lab Levels: No results found for: "LITHIUM" Lab Results  Component Value Date   VALPROATE 17 (L) 02/17/2022   VALPROATE 33 (L) 01/18/2022   No results found for: "CBMZ"  Physical Findings   PHQ2-9    Flowsheet Row ED from 11/23/2021 in [facility]  PHQ-2 Total Score 2  PHQ-9 Total Score 3      Flowsheet Row ED from 02/18/2022  in [facility] ED from 02/17/2022 in [facility] EMERGENCY [facility]PART[facility] ED from 02/09/2022 in [facility])  C-SSRS RISK CATEGORY No Risk No Risk No Risk        Musculoskeletal  Strength & Muscle Tone: within normal limits Gait & Station: normal Patient leans: N/A  Psychiatric Specialty Exam  Presentation  General Appearance:  Casual; Fairly Groomed  Eye Contact: Good  Speech: Clear and Coherent  Speech Volume: Normal  Handedness: Right   Mood and Affect  Mood: Euthymic  Affect: Blunt   Thought Process  Thought Processes: Coherent; Goal Directed; Linear  Descriptions of Associations:Intact  Orientation:Full (Time, Place and Person)  Thought Content:Logical  Diagnosis of Schizophrenia or Schizoaffective disorder in past: Yes  Duration of Psychotic Symptoms: No data recorded  Hallucinations:Hallucinations: None  Ideas of Reference:None  Suicidal Thoughts:Suicidal Thoughts: No  Homicidal Thoughts:Homicidal Thoughts: No   Sensorium  Memory: Immediate Good  Judgment: Intact  Insight: Present   Executive Functions  Concentration: Fair  Attention Span: Fair  Recall: [employer] of Knowledge: Fair  Language: Fair   Psychomotor Activity  Psychomotor Activity: Psychomotor Activity: Normal   Assets  Assets: Communication Skills; Desire for Improvement; Financial Resources/Insurance; Leisure Time; Resilience   Sleep  Sleep: Sleep: Good   Nutritional Assessment (For OBS and [facility] admissions only) Has the patient had a weight loss  or gain of 10 pounds or more in the last 3 months?: No Has the patient had a decrease in food intake/or appetite?: No Does the patient have dental problems?: No Does the patient have eating habits or behaviors that may be indicators of an eating disorder including binging or inducing vomiting?: No Has the patient recently lost weight  without trying?: 0 Has the patient been eating poorly because of a decreased appetite?: 0 Malnutrition Screening Tool Score: 0    Physical Exam  Physical Exam Constitutional:      General: She is not in acute distress.    Appearance: She is not ill-appearing, toxic-appearing or diaphoretic.  Eyes:     General: No scleral icterus. Cardiovascular:     Rate and Rhythm: Normal rate.  Pulmonary:     Effort: Pulmonary effort is normal. No respiratory distress.  Neurological:     Mental Status: She is alert and oriented to person, place, and time.  Psychiatric:        Attention and Perception: Attention and perception normal.        Mood and Affect: Mood normal. Affect is blunt.        Speech: Speech normal.        Behavior: Behavior normal. Behavior is cooperative.        Thought Content: Thought content normal.    Review of Systems  Constitutional:  Negative for chills and fever.  Respiratory:  Negative for shortness of breath.   Cardiovascular:  Negative for chest pain and palpitations.  Gastrointestinal:  Negative for abdominal pain.  Neurological:  Negative for headaches.   Blood pressure 126/78, pulse 86, temperature 98.4 F (36.9 C), temperature source Oral, resp. rate 18, SpO2 100 %. There is no height or weight on file to calculate BMI.  Treatment Plan Summary: Plan Pt remains psychiatrically cleared. Pending discharge on 03/15/21 [facility].  Lauree ChandlerJacqueline Eun Lowry Bala, NP 03/09/2022 8:32 AM

## 2022-03-09 NOTE — ED Notes (Signed)
Pt sleeping at present, no distress noted.  Monitoring for safety. 

## 2022-03-09 NOTE — ED Notes (Signed)
Patient accepted scheduled meds w/o difficulty. Breakfast provided. Patient currently resting on pull out chair/bed in no distress. Safety maintained and will continue to monitor.

## 2022-03-09 NOTE — ED Notes (Signed)
Pt laying down in her bed resting, calm and cooperative. No c/o pian or distress. Will continue to monitor for safety

## 2022-03-10 DIAGNOSIS — F209 Schizophrenia, unspecified: Secondary | ICD-10-CM | POA: Diagnosis not present

## 2022-03-10 LAB — CBC WITH DIFFERENTIAL/PLATELET
Abs Immature Granulocytes: 0.03 10*3/uL (ref 0.00–0.07)
Basophils Absolute: 0.1 10*3/uL (ref 0.0–0.1)
Basophils Relative: 1 %
Eosinophils Absolute: 0.1 10*3/uL (ref 0.0–0.5)
Eosinophils Relative: 1 %
HCT: 34.7 % — ABNORMAL LOW (ref 36.0–46.0)
Hemoglobin: 10.7 g/dL — ABNORMAL LOW (ref 12.0–15.0)
Immature Granulocytes: 0 %
Lymphocytes Relative: 26 %
Lymphs Abs: 2.1 10*3/uL (ref 0.7–4.0)
MCH: 23.4 pg — ABNORMAL LOW (ref 26.0–34.0)
MCHC: 30.8 g/dL (ref 30.0–36.0)
MCV: 75.8 fL — ABNORMAL LOW (ref 80.0–100.0)
Monocytes Absolute: 0.7 10*3/uL (ref 0.1–1.0)
Monocytes Relative: 10 %
Neutro Abs: 4.8 10*3/uL (ref 1.7–7.7)
Neutrophils Relative %: 62 %
Platelets: 347 10*3/uL (ref 150–400)
RBC: 4.58 MIL/uL (ref 3.87–5.11)
RDW: 17 % — ABNORMAL HIGH (ref 11.5–15.5)
WBC: 7.8 10*3/uL (ref 4.0–10.5)
nRBC: 0 % (ref 0.0–0.2)

## 2022-03-10 LAB — GLUCOSE, CAPILLARY
Glucose-Capillary: 133 mg/dL — ABNORMAL HIGH (ref 70–99)
Glucose-Capillary: 107 mg/dL — ABNORMAL HIGH (ref 70–99)
Glucose-Capillary: 225 mg/dL — ABNORMAL HIGH (ref 70–99)
Glucose-Capillary: 155 mg/dL — ABNORMAL HIGH (ref 70–99)

## 2022-03-10 NOTE — ED Notes (Signed)
Pt didn't want breakfast, snack was giving

## 2022-03-10 NOTE — ED Provider Notes (Signed)
Behavioral Health Progress Note  Date and Time: 03/10/2022 8:54 AM Name: Teresa Murillo MRN:  [id number]  Subjective:    Teresa Murillo is a 62 y/o female with PMH of schizophrenia, borderline intellectual functioning, tobacco use d/o, HTN, HLD, DM2 (insulin-dependent), atrial flutter, COPD, admitted to [facility] on 11/23/21 voluntarily, accompanied by [employer], and reported that she was locked out of her group home. It was later confirmed that Teresa Murillo was dismissed from group home due to recurrent history of eloping from the group home. Teresa Murillo has been boarding at this facility until placement is secured.  "I'm alright"  Teresa Murillo is sitting up on my approach. She states she has been attending to her ADLs this morning. She endorses euthymic mood. She denies mood disturbances, including depression, anxiety, euphoria. She states she has a meeting today to go over her placement at [facility]. She verbalizes she is looking forward to discharge and notes she will miss staff here. Support, encouragement provided. She denies suicidal, homicidal or violent ideations. She denies auditory visual hallucinations or paranoia. Objectively, there is no evidence of agitation, aggression, distractibility or internal preoccupation. No delusions or paranoia elicited. She is calm, cooperative, pleasant. Pt remains psychiatrically cleared. Pending discharge on 03/15/22 to [facility].  Diagnosis:  Final diagnoses:  Schizophrenia, unspecified type (HCC)    Total Time spent with patient: 15 minutes  Past Psychiatric History: see HPI  Past Medical History:  Past Medical History:  Diagnosis Date   Borderline intellectual functioning 12/14/2021   On 12/14/2021: Appreciate assistance from psychology consult. On the Wechsler Adult Intelligence Scale-4, Ms. Nasser achieved a full-scale IQ score of 73 and a percentile rank of 4 placing her in the borderline range of intellectual functioning.     Chronic obstructive pulmonary disease (COPD) (HCC)    Glaucoma    Hyperlipidemia    Hypertension    Iron deficiency    Schizoaffective disorder (HCC)    Type 2 diabetes mellitus (HCC)     Past Surgical History:  Procedure Laterality Date   TUBAL LIGATION     Family History:  Family History  Problem Relation Age of Onset   Breast cancer Maternal Grandmother    Family Psychiatric  History: None reported Social History:  Social History   Substance and Sexual Activity  Alcohol Use Not Currently     Social History   Substance and Sexual Activity  Drug Use Not Currently    Social History   Socioeconomic History   Marital status: Divorced    Spouse name: Not on file   Number of children: Not on file   Years of education: Not on file   Highest education level: Not on file  Occupational History   Not on file  Tobacco Use   Smoking status: Former    Packs/day: 1.00    Types: Cigars, Cigarettes    Quit date: 11/09/2021    Years since quitting: 0.3   Smokeless tobacco: Current  Vaping Use   Vaping Use: Never used  Substance and Sexual Activity   Alcohol use: Not Currently   Drug use: Not Currently   Sexual activity: Not Currently    Birth control/protection: Surgical  Other Topics Concern   Not on file  Social History Narrative   Not on file   Social Determinants of Health   Financial Resource Strain: Not on file  Food Insecurity: Not on file  Transportation Needs: Not on file  Physical Activity: Not on file  Stress:  Not on file  Social Connections: Not on file   SDOH:  SDOH Screenings   Depression (PHQ2-9): Low Risk  (11/23/2021)  Tobacco Use: High Risk (03/08/2022)   Additional Social History:                         Sleep: Good  Appetite:  Good  Current Medications:  Current Facility-Administered Medications  Medication Dose Route Frequency Provider Last Rate Last Admin   acetaminophen (TYLENOL) tablet 650 mg  650 mg Oral Q6H PRN  Sindy Guadeloupe, NP   650 mg at 03/03/22 2130   albuterol (VENTOLIN HFA) 108 (90 Base) MCG/ACT inhaler 2 puff  2 puff Inhalation Q6H PRN Sindy Guadeloupe, NP   2 puff at 02/20/22 1944   alum & mag hydroxide-simeth (MAALOX/MYLANTA) 200-200-20 MG/5ML suspension 30 mL  30 mL Oral Q4H PRN Sindy Guadeloupe, NP   30 mL at 03/07/22 2155   apixaban (ELIQUIS) tablet 5 mg  5 mg Oral BID Sindy Guadeloupe, NP   5 mg at 03/09/22 2111   ARIPiprazole (ABILIFY) tablet 10 mg  10 mg Oral Daily Sindy Guadeloupe, NP   10 mg at 03/09/22 0936   cloZAPine (CLOZARIL) tablet 75 mg  75 mg Oral BID Park Pope, MD   75 mg at 03/09/22 2110   colchicine tablet 0.6 mg  0.6 mg Oral QHS Sindy Guadeloupe, NP   0.6 mg at 03/09/22 2110   diltiazem (CARDIZEM CD) 24 hr capsule 240 mg  240 mg Oral Daily Sindy Guadeloupe, NP   240 mg at 03/09/22 0935   divalproex (DEPAKOTE ER) 24 hr tablet 500 mg  500 mg Oral BID Sindy Guadeloupe, NP   500 mg at 03/09/22 2110   haloperidol (HALDOL) tablet 10 mg  10 mg Oral TID PRN Sindy Guadeloupe, NP       insulin aspart (novoLOG) injection 0-9 Units  0-9 Units Subcutaneous TID WC Park Pope, MD   1 Units at 03/10/22 0748   insulin glargine-yfgn (SEMGLEE) injection 12 Units  12 Units Subcutaneous Daily Sindy Guadeloupe, NP   12 Units at 03/09/22 0935   latanoprost (XALATAN) 0.005 % ophthalmic solution 1 drop  1 drop Both Eyes QHS Sindy Guadeloupe, NP   1 drop at 03/09/22 2113   magnesium hydroxide (MILK OF MAGNESIA) suspension 30 mL  30 mL Oral Daily PRN Sindy Guadeloupe, NP       metFORMIN (GLUCOPHAGE) tablet 500 mg  500 mg Oral BID WC Sindy Guadeloupe, NP   500 mg at 03/10/22 0748   mometasone-formoterol (DULERA) 200-5 MCG/ACT inhaler 2 puff  2 puff Inhalation BID Sindy Guadeloupe, NP   2 puff at 03/10/22 0747   multivitamins with iron tablet 1 tablet  1 tablet Oral QHS Princess Bruins, DO   1 tablet at 03/09/22 2111   nicotine (NICODERM CQ - dosed in mg/24 hr) patch 7 mg  7 mg Transdermal Daily Sindy Guadeloupe, NP   7 mg at 03/09/22 0935    rosuvastatin (CRESTOR) tablet 20 mg  20 mg Oral QHS Princess Bruins, DO   20 mg at 03/09/22 2110   valbenazine (INGREZZA) capsule 40 mg  40 mg Oral q AM Sindy Guadeloupe, NP   40 mg at 03/10/22 0608   Vitamin D (Ergocalciferol) (DRISDOL) 1.25 MG (50000 UNIT) capsule 50,000 Units  50,000 Units Oral Q Crista Curb, NP   50,000 Units at 03/04/22 1610   Current Outpatient Medications  Medication Sig Dispense Refill   Accu-Chek  Softclix Lancets lancets Use as directed up to four times daily 100 each 0   apixaban (ELIQUIS) 5 MG TABS tablet Take 1 tablet (5 mg total) by mouth 2 (two) times daily. 60 tablet 0   ARIPiprazole (ABILIFY) 10 MG tablet Take 1 tablet (10 mg total) by mouth daily. 30 tablet 0   Blood Glucose Monitoring Suppl (ACCU-CHEK GUIDE) w/Device KIT Use as directed up to four times daily 1 kit 0   budesonide-formoterol (SYMBICORT) 160-4.5 MCG/ACT inhaler Inhale 2 puffs into the lungs in the morning and at bedtime.     Cholecalciferol (VITAMIN D3) 1.25 MG (50000 UT) CAPS Take 50,000 Units by mouth every Thursday.     clozapine (CLOZARIL) 50 MG tablet Take 1 tablet (50 mg total) by mouth daily. 30 tablet 0   colchicine 0.6 MG tablet Take 1 tablet (0.6 mg total) by mouth at bedtime. 30 tablet 0   diltiazem (CARDIZEM CD) 240 MG 24 hr capsule Take 1 capsule (240 mg total) by mouth daily. (Patient not taking: Reported on 11/24/2021) 30 capsule 0   divalproex (DEPAKOTE ER) 500 MG 24 hr tablet Take 1 tablet (500 mg total) by mouth 2 (two) times daily. 60 tablet 0   glucose blood test strip Use as directed up to four times daily 50 each 0   haloperidol (HALDOL) 10 MG tablet Take 1 tablet (10 mg total) by mouth 3 (three) times daily as needed for agitation (and psychotic symptoms).     INGREZZA 40 MG capsule Take 1 capsule (40 mg total) by mouth in the morning. 30 capsule 0   insulin aspart (NOVOLOG) 100 UNIT/ML FlexPen Before each meal 3 times a day, 140-199 - 2 units, 200-250 - 4 units, 251-299 - 6  units,  300-349 - 8 units,  350 or above 10 units. Insulin PEN if approved, provide syringes and needles if needed.Please switch to any approved short acting Insulin if needed. 15 mL 0   insulin glargine (LANTUS) 100 UNIT/ML Solostar Pen Inject 12 Units into the skin daily. 15 mL 0   Insulin Pen Needle 32G X 4 MM MISC Use 4 times a day with insulin, 1 month supply. 100 each 0   latanoprost (XALATAN) 0.005 % ophthalmic solution Place 1 drop into both eyes at bedtime.     metFORMIN (GLUCOPHAGE) 500 MG tablet Take 500 mg by mouth 2 (two) times daily with a meal.     nicotine (NICODERM CQ - DOSED IN MG/24 HR) 7 mg/24hr patch Place 1 patch (7 mg total) onto the skin daily. 28 patch 0   PROAIR HFA 108 (90 Base) MCG/ACT inhaler Inhale 2 puffs into the lungs every 6 (six) hours as needed for wheezing or shortness of breath.      Labs  Lab Results:  Admission on 02/18/2022  Component Date Value Ref Range Status   Glucose-Capillary 02/18/2022 254 (H)  70 - 99 mg/dL Final   Glucose reference range applies only to samples taken after fasting for at least 8 hours.   Glucose-Capillary 02/18/2022 112 (H)  70 - 99 mg/dL Final   Glucose reference range applies only to samples taken after fasting for at least 8 hours.   Glucose-Capillary 02/19/2022 159 (H)  70 - 99 mg/dL Final   Glucose reference range applies only to samples taken after fasting for at least 8 hours.   Glucose-Capillary 02/19/2022 160 (H)  70 - 99 mg/dL Final   Glucose reference range applies only to samples taken after fasting for  at least 8 hours.   Glucose-Capillary 02/19/2022 177 (H)  70 - 99 mg/dL Final   Glucose reference range applies only to samples taken after fasting for at least 8 hours.   Glucose-Capillary 02/19/2022 147 (H)  70 - 99 mg/dL Final   Glucose reference range applies only to samples taken after fasting for at least 8 hours.   Glucose-Capillary 02/20/2022 126 (H)  70 - 99 mg/dL Final   Glucose reference range applies  only to samples taken after fasting for at least 8 hours.   Glucose-Capillary 02/20/2022 132 (H)  70 - 99 mg/dL Final   Glucose reference range applies only to samples taken after fasting for at least 8 hours.   Glucose-Capillary 02/20/2022 125 (H)  70 - 99 mg/dL Final   Glucose reference range applies only to samples taken after fasting for at least 8 hours.   Glucose-Capillary 02/21/2022 151 (H)  70 - 99 mg/dL Final   Glucose reference range applies only to samples taken after fasting for at least 8 hours.   Glucose-Capillary 02/21/2022 106 (H)  70 - 99 mg/dL Final   Glucose reference range applies only to samples taken after fasting for at least 8 hours.   Glucose-Capillary 02/21/2022 149 (H)  70 - 99 mg/dL Final   Glucose reference range applies only to samples taken after fasting for at least 8 hours.   Glucose-Capillary 02/22/2022 131 (H)  70 - 99 mg/dL Final   Glucose reference range applies only to samples taken after fasting for at least 8 hours.   Glucose-Capillary 02/22/2022 141 (H)  70 - 99 mg/dL Final   Glucose reference range applies only to samples taken after fasting for at least 8 hours.   Glucose-Capillary 02/22/2022 183 (H)  70 - 99 mg/dL Final   Glucose reference range applies only to samples taken after fasting for at least 8 hours.   Glucose-Capillary 02/23/2022 133 (H)  70 - 99 mg/dL Final   Glucose reference range applies only to samples taken after fasting for at least 8 hours.   WBC 02/23/2022 4.8  4.0 - 10.5 K/uL Final   RBC 02/23/2022 3.96  3.87 - 5.11 MIL/uL Final   Hemoglobin 02/23/2022 9.7 (L)  12.0 - 15.0 g/dL Final   HCT 16/11/9602 30.6 (L)  36.0 - 46.0 % Final   MCV 02/23/2022 77.3 (L)  80.0 - 100.0 fL Final   MCH 02/23/2022 24.5 (L)  26.0 - 34.0 pg Final   MCHC 02/23/2022 31.7  30.0 - 36.0 g/dL Final   RDW 54/10/8117 16.5 (H)  11.5 - 15.5 % Final   Platelets 02/23/2022 372  150 - 400 K/uL Final   nRBC 02/23/2022 0.0  0.0 - 0.2 % Final   Neutrophils  Relative % 02/23/2022 56  % Final   Neutro Abs 02/23/2022 2.7  1.7 - 7.7 K/uL Final   Lymphocytes Relative 02/23/2022 32  % Final   Lymphs Abs 02/23/2022 1.5  0.7 - 4.0 K/uL Final   Monocytes Relative 02/23/2022 9  % Final   Monocytes Absolute 02/23/2022 0.4  0.1 - 1.0 K/uL Final   Eosinophils Relative 02/23/2022 2  % Final   Eosinophils Absolute 02/23/2022 0.1  0.0 - 0.5 K/uL Final   Basophils Relative 02/23/2022 1  % Final   Basophils Absolute 02/23/2022 0.1  0.0 - 0.1 K/uL Final   Immature Granulocytes 02/23/2022 0  % Final   Abs Immature Granulocytes 02/23/2022 0.01  0.00 - 0.07 K/uL Final   Performed  at [facility], [place] [address] [place],  [address]   Glucose-Capillary 02/23/2022 202 (H)  70 - 99 mg/dL Final   Glucose reference range applies only to samples taken after fasting for at least 8 hours.   Glucose-Capillary 02/23/2022 143 (H)  70 - 99 mg/dL Final   Glucose reference range applies only to samples taken after fasting for at least 8 hours.   Glucose-Capillary 02/23/2022 143 (H)  70 - 99 mg/dL Final   Glucose reference range applies only to samples taken after fasting for at least 8 hours.   Glucose-Capillary 02/24/2022 207 (H)  70 - 99 mg/dL Final   Glucose reference range applies only to samples taken after fasting for at least 8 hours.   Glucose-Capillary 02/24/2022 109 (H)  70 - 99 mg/dL Final   Glucose reference range applies only to samples taken after fasting for at least 8 hours.   Glucose-Capillary 02/24/2022 127 (H)  70 - 99 mg/dL Final   Glucose reference range applies only to samples taken after fasting for at least 8 hours.   Glucose-Capillary 02/24/2022 130 (H)  70 - 99 mg/dL Final   Glucose reference range applies only to samples taken after fasting for at least 8 hours.   Glucose-Capillary 02/25/2022 117 (H)  70 - 99 mg/dL Final   Glucose reference range applies only to samples taken after fasting for at least 8 hours.   Glucose-Capillary  02/25/2022 104 (H)  70 - 99 mg/dL Final   Glucose reference range applies only to samples taken after fasting for at least 8 hours.   Glucose-Capillary 02/25/2022 180 (H)  70 - 99 mg/dL Final   Glucose reference range applies only to samples taken after fasting for at least 8 hours.   Glucose-Capillary 02/25/2022 180 (H)  70 - 99 mg/dL Final   Glucose reference range applies only to samples taken after fasting for at least 8 hours.   Glucose-Capillary 02/26/2022 133 (H)  70 - 99 mg/dL Final   Glucose reference range applies only to samples taken after fasting for at least 8 hours.   Glucose-Capillary 02/26/2022 85  70 - 99 mg/dL Final   Glucose reference range applies only to samples taken after fasting for at least 8 hours.   Glucose-Capillary 02/26/2022 136 (H)  70 - 99 mg/dL Final   Glucose reference range applies only to samples taken after fasting for at least 8 hours.   Glucose-Capillary 02/26/2022 170 (H)  70 - 99 mg/dL Final   Glucose reference range applies only to samples taken after fasting for at least 8 hours.   Glucose-Capillary 02/26/2022 185 (H)  70 - 99 mg/dL Final   Glucose reference range applies only to samples taken after fasting for at least 8 hours.   Glucose-Capillary 02/27/2022 124 (H)  70 - 99 mg/dL Final   Glucose reference range applies only to samples taken after fasting for at least 8 hours.   Glucose-Capillary 02/27/2022 161 (H)  70 - 99 mg/dL Final   Glucose reference range applies only to samples taken after fasting for at least 8 hours.   Glucose-Capillary 02/27/2022 170 (H)  70 - 99 mg/dL Final   Glucose reference range applies only to samples taken after fasting for at least 8 hours.   Glucose-Capillary 02/27/2022 154 (H)  70 - 99 mg/dL Final   Glucose reference range applies only to samples taken after fasting for at least 8 hours.   Glucose-Capillary 02/28/2022 109 (H)  70 -  99 mg/dL Final   Glucose reference range applies only to samples taken after  fasting for at least 8 hours.   Glucose-Capillary 02/28/2022 134 (H)  70 - 99 mg/dL Final   Glucose reference range applies only to samples taken after fasting for at least 8 hours.   Glucose-Capillary 02/28/2022 160 (H)  70 - 99 mg/dL Final   Glucose reference range applies only to samples taken after fasting for at least 8 hours.   Glucose-Capillary 02/28/2022 136 (H)  70 - 99 mg/dL Final   Glucose reference range applies only to samples taken after fasting for at least 8 hours.   Glucose-Capillary 02/28/2022 173 (H)  70 - 99 mg/dL Final   Glucose reference range applies only to samples taken after fasting for at least 8 hours.   Glucose-Capillary 03/01/2022 134 (H)  70 - 99 mg/dL Final   Glucose reference range applies only to samples taken after fasting for at least 8 hours.   Glucose-Capillary 03/01/2022 116 (H)  70 - 99 mg/dL Final   Glucose reference range applies only to samples taken after fasting for at least 8 hours.   Glucose-Capillary 03/01/2022 227 (H)  70 - 99 mg/dL Final   Glucose reference range applies only to samples taken after fasting for at least 8 hours.   Glucose-Capillary 03/01/2022 157 (H)  70 - 99 mg/dL Final   Glucose reference range applies only to samples taken after fasting for at least 8 hours.   Glucose-Capillary 03/02/2022 99  70 - 99 mg/dL Final   Glucose reference range applies only to samples taken after fasting for at least 8 hours.   WBC 03/02/2022 6.0  4.0 - 10.5 K/uL Final   RBC 03/02/2022 4.15  3.87 - 5.11 MIL/uL Final   Hemoglobin 03/02/2022 9.7 (L)  12.0 - 15.0 g/dL Final   HCT 25/95/6387 31.5 (L)  36.0 - 46.0 % Final   MCV 03/02/2022 75.9 (L)  80.0 - 100.0 fL Final   MCH 03/02/2022 23.4 (L)  26.0 - 34.0 pg Final   MCHC 03/02/2022 30.8  30.0 - 36.0 g/dL Final   RDW 56/43/3295 16.4 (H)  11.5 - 15.5 % Final   Platelets 03/02/2022 395  150 - 400 K/uL Final   nRBC 03/02/2022 0.0  0.0 - 0.2 % Final   Neutrophils Relative % 03/02/2022 61  % Final    Neutro Abs 03/02/2022 3.6  1.7 - 7.7 K/uL Final   Lymphocytes Relative 03/02/2022 29  % Final   Lymphs Abs 03/02/2022 1.7  0.7 - 4.0 K/uL Final   Monocytes Relative 03/02/2022 8  % Final   Monocytes Absolute 03/02/2022 0.5  0.1 - 1.0 K/uL Final   Eosinophils Relative 03/02/2022 1  % Final   Eosinophils Absolute 03/02/2022 0.1  0.0 - 0.5 K/uL Final   Basophils Relative 03/02/2022 1  % Final   Basophils Absolute 03/02/2022 0.1  0.0 - 0.1 K/uL Final   Immature Granulocytes 03/02/2022 0  % Final   Abs Immature Granulocytes 03/02/2022 0.01  0.00 - 0.07 K/uL Final   Performed at [facility], [address], [place], [place] [address]   Glucose-Capillary 03/02/2022 171 (H)  70 - 99 mg/dL Final   Glucose reference range applies only to samples taken after fasting for at least 8 hours.   Glucose-Capillary 03/02/2022 108 (H)  70 - 99 mg/dL Final   Glucose reference range applies only to samples taken after fasting for at least 8 hours.  Glucose-Capillary 03/02/2022 203 (H)  70 - 99 mg/dL Final   Glucose reference range applies only to samples taken after fasting for at least 8 hours.   Glucose-Capillary 03/03/2022 128 (H)  70 - 99 mg/dL Final   Glucose reference range applies only to samples taken after fasting for at least 8 hours.   Glucose-Capillary 03/03/2022 134 (H)  70 - 99 mg/dL Final   Glucose reference range applies only to samples taken after fasting for at least 8 hours.   Glucose-Capillary 03/03/2022 100 (H)  70 - 99 mg/dL Final   Glucose reference range applies only to samples taken after fasting for at least 8 hours.   Glucose-Capillary 03/03/2022 160 (H)  70 - 99 mg/dL Final   Glucose reference range applies only to samples taken after fasting for at least 8 hours.   Glucose-Capillary 03/04/2022 116 (H)  70 - 99 mg/dL Final   Glucose reference range applies only to samples taken after fasting for at least 8 hours.   Glucose-Capillary 03/04/2022 104 (H)  70 - 99 mg/dL Final    Glucose reference range applies only to samples taken after fasting for at least 8 hours.   Glucose-Capillary 03/04/2022 156 (H)  70 - 99 mg/dL Final   Glucose reference range applies only to samples taken after fasting for at least 8 hours.   Glucose-Capillary 03/04/2022 201 (H)  70 - 99 mg/dL Final   Glucose reference range applies only to samples taken after fasting for at least 8 hours.   Glucose-Capillary 03/05/2022 120 (H)  70 - 99 mg/dL Final   Glucose reference range applies only to samples taken after fasting for at least 8 hours.   Glucose-Capillary 03/05/2022 237 (H)  70 - 99 mg/dL Final   Glucose reference range applies only to samples taken after fasting for at least 8 hours.   Glucose-Capillary 03/05/2022 168 (H)  70 - 99 mg/dL Final   Glucose reference range applies only to samples taken after fasting for at least 8 hours.   Glucose-Capillary 03/05/2022 200 (H)  70 - 99 mg/dL Final   Glucose reference range applies only to samples taken after fasting for at least 8 hours.   Glucose-Capillary 03/06/2022 135 (H)  70 - 99 mg/dL Final   Glucose reference range applies only to samples taken after fasting for at least 8 hours.   Glucose-Capillary 03/06/2022 135 (H)  70 - 99 mg/dL Final   Glucose reference range applies only to samples taken after fasting for at least 8 hours.   Glucose-Capillary 03/06/2022 235 (H)  70 - 99 mg/dL Final   Glucose reference range applies only to samples taken after fasting for at least 8 hours.   Glucose-Capillary 03/07/2022 141 (H)  70 - 99 mg/dL Final   Glucose reference range applies only to samples taken after fasting for at least 8 hours.   Glucose-Capillary 03/07/2022 157 (H)  70 - 99 mg/dL Final   Glucose reference range applies only to samples taken after fasting for at least 8 hours.   Glucose-Capillary 03/07/2022 199 (H)  70 - 99 mg/dL Final   Glucose reference range applies only to samples taken after fasting for at least 8 hours.    Glucose-Capillary 03/07/2022 241 (H)  70 - 99 mg/dL Final   Glucose reference range applies only to samples taken after fasting for at least 8 hours.   Glucose-Capillary 03/08/2022 123 (H)  70 - 99 mg/dL Final   Glucose reference range applies only to  samples taken after fasting for at least 8 hours.   Glucose-Capillary 03/06/2022 155 (H)  70 - 99 mg/dL Final   Glucose reference range applies only to samples taken after fasting for at least 8 hours.   Glucose-Capillary 03/08/2022 197 (H)  70 - 99 mg/dL Final   Glucose reference range applies only to samples taken after fasting for at least 8 hours.   Glucose-Capillary 03/08/2022 158 (H)  70 - 99 mg/dL Final   Glucose reference range applies only to samples taken after fasting for at least 8 hours.   Glucose-Capillary 03/09/2022 118 (H)  70 - 99 mg/dL Final   Glucose reference range applies only to samples taken after fasting for at least 8 hours.   Glucose-Capillary 03/09/2022 142 (H)  70 - 99 mg/dL Final   Glucose reference range applies only to samples taken after fasting for at least 8 hours.   Glucose-Capillary 03/09/2022 243 (H)  70 - 99 mg/dL Final   Glucose reference range applies only to samples taken after fasting for at least 8 hours.   Glucose-Capillary 03/09/2022 171 (H)  70 - 99 mg/dL Final   Glucose reference range applies only to samples taken after fasting for at least 8 hours.   Glucose-Capillary 03/10/2022 133 (H)  70 - 99 mg/dL Final   Glucose reference range applies only to samples taken after fasting for at least 8 hours.  Admission on 02/17/2022, Discharged on 02/18/2022  Component Date Value Ref Range Status   Sodium 02/17/2022 136  135 - 145 mmol/L Final   Potassium 02/17/2022 3.9  3.5 - 5.1 mmol/L Final   Chloride 02/17/2022 101  98 - 111 mmol/L Final   CO2 02/17/2022 25  22 - 32 mmol/L Final   Glucose, Bld 02/17/2022 113 (H)  70 - 99 mg/dL Final   Glucose reference range applies only to samples taken after fasting  for at least 8 hours.   BUN 02/17/2022 9  6 - 20 mg/dL Final   Creatinine, Ser 02/17/2022 0.50  0.44 - 1.00 mg/dL Final   Calcium 12/23/8525 8.9  8.9 - 10.3 mg/dL Final   GFR, Estimated 02/17/2022 >60  >60 mL/min Final   Comment: (NOTE) Calculated using the CKD-EPI Creatinine Equation (2021)    Anion gap 02/17/2022 10  5 - 15 Final   Performed at [facility], [address], [place], [place] [address]   WBC 02/17/2022 6.5  4.0 - 10.5 K/uL Final   RBC 02/17/2022 4.24  3.87 - 5.11 MIL/uL Final   Hemoglobin 02/17/2022 10.2 (L)  12.0 - 15.0 g/dL Final   HCT 35/36/1443 33.2 (L)  36.0 - 46.0 % Final   MCV 02/17/2022 78.3 (L)  80.0 - 100.0 fL Final   MCH 02/17/2022 24.1 (L)  26.0 - 34.0 pg Final   MCHC 02/17/2022 30.7  30.0 - 36.0 g/dL Final   RDW 15/40/0867 17.2 (H)  11.5 - 15.5 % Final   Platelets 02/17/2022 390  150 - 400 K/uL Final   nRBC 02/17/2022 0.0  0.0 - 0.2 % Final   Performed at [facility], [address], [place], [place] [address]   Troponin I (High Sensitivity) 02/17/2022 4  <18 ng/L Final   Comment: (NOTE) Elevated high sensitivity troponin I (hsTnI) values and significant  changes across serial measurements may suggest ACS but many other  chronic and acute conditions are known to elevate hsTnI results.  Refer to the "Links" section for chest pain algorithms and  additional  guidance. Performed at [facility], [address] [place], [place] [address]    Valproic Acid Lvl 02/17/2022 17 (L)  50.0 - 100.0 ug/mL Final   Performed at [facility], [address], [place], [place] [address]   B Natriuretic Peptide 02/17/2022 30.9  0.0 - 100.0 pg/mL Final   Performed at [facility], [address], [place], [place] [address]   Troponin I (High Sensitivity) 02/18/2022 4  <18 ng/L Final   Comment: (NOTE) Elevated high sensitivity troponin I (hsTnI) values and significant  changes across serial measurements may suggest ACS but many other   chronic and acute conditions are known to elevate hsTnI results.  Refer to the "Links" section for chest pain algorithms and additional  guidance. Performed at [facility], [address] [place], [place] [address]    D-Dimer, Quant 02/18/2022 0.29  0.00 - 0.50 ug/mL-FEU Final   Comment: (NOTE) At the manufacturer cut-off value of 0.5 g/mL FEU, this assay has a negative predictive value of 95-100%.This assay is intended for use in conjunction with a clinical pretest probability (PTP) assessment model to exclude pulmonary embolism (PE) and deep venous thrombosis (DVT) in outpatients suspected of PE or DVT. Results should be correlated with clinical presentation. Performed at [facility], [address], [place], [place] [address]   Admission on 02/09/2022, Discharged on 02/17/2022  Component Date Value Ref Range Status   Glucose-Capillary 02/09/2022 118 (H)  70 - 99 mg/dL Final   Glucose reference range applies only to samples taken after fasting for at least 8 hours.   Glucose-Capillary 02/10/2022 148 (H)  70 - 99 mg/dL Final   Glucose reference range applies only to samples taken after fasting for at least 8 hours.   Glucose-Capillary 02/10/2022 174 (H)  70 - 99 mg/dL Final   Glucose reference range applies only to samples taken after fasting for at least 8 hours.   Glucose-Capillary 02/10/2022 128 (H)  70 - 99 mg/dL Final   Glucose reference range applies only to samples taken after fasting for at least 8 hours.   Glucose-Capillary 02/10/2022 182 (H)  70 - 99 mg/dL Final   Glucose reference range applies only to samples taken after fasting for at least 8 hours.   Glucose-Capillary 02/11/2022 158 (H)  70 - 99 mg/dL Final   Glucose reference range applies only to samples taken after fasting for at least 8 hours.   Glucose-Capillary 02/11/2022 159 (H)  70 - 99 mg/dL Final   Glucose reference range applies only to samples taken after fasting for at least 8 hours.    Glucose-Capillary 02/11/2022 153 (H)  70 - 99 mg/dL Final   Glucose reference range applies only to samples taken after fasting for at least 8 hours.   Glucose-Capillary 02/11/2022 159 (H)  70 - 99 mg/dL Final   Glucose reference range applies only to samples taken after fasting for at least 8 hours.   Glucose-Capillary 02/11/2022 153 (H)  70 - 99 mg/dL Final   Glucose reference range applies only to samples taken after fasting for at least 8 hours.   Glucose-Capillary 02/11/2022 109 (H)  70 - 99 mg/dL Final   Glucose reference range applies only to samples taken after fasting for at least 8 hours.   Glucose-Capillary 02/11/2022 213 (H)  70 - 99 mg/dL Final   Glucose reference range applies only to samples taken after fasting for at least 8 hours.  Glucose-Capillary 02/11/2022 229 (H)  70 - 99 mg/dL Final   Glucose reference range applies only to samples taken after fasting for at least 8 hours.   Glucose-Capillary 02/12/2022 128 (H)  70 - 99 mg/dL Final   Glucose reference range applies only to samples taken after fasting for at least 8 hours.   Glucose-Capillary 02/12/2022 149 (H)  70 - 99 mg/dL Final   Glucose reference range applies only to samples taken after fasting for at least 8 hours.   Color, Urine 02/12/2022 YELLOW  YELLOW Final   APPearance 02/12/2022 CLEAR  CLEAR Final   Specific Gravity, Urine 02/12/2022 1.015  1.005 - 1.030 Final   pH 02/12/2022 5.0  5.0 - 8.0 Final   Glucose, UA 02/12/2022 NEGATIVE  NEGATIVE mg/dL Final   Hgb urine dipstick 02/12/2022 NEGATIVE  NEGATIVE Final   Bilirubin Urine 02/12/2022 NEGATIVE  NEGATIVE Final   Ketones, ur 02/12/2022 NEGATIVE  NEGATIVE mg/dL Final   Protein, ur 40/98/1191 NEGATIVE  NEGATIVE mg/dL Final   Nitrite 47/82/9562 NEGATIVE  NEGATIVE Final   Leukocytes,Ua 02/12/2022 NEGATIVE  NEGATIVE Final   Performed at [facility], [address] [place], [place] [address]   Specimen Description 02/12/2022 URINE, CLEAN CATCH   Final    Special Requests 02/12/2022    Final                   Value:NONE Performed at [facility], [address] [place], [place] [address]    Culture 02/12/2022 10,000 COLONIES/mL PROTEUS MIRABILIS (A)   Final   Report Status 02/12/2022 02/15/2022 FINAL   Final   Organism ID, Bacteria 02/12/2022 PROTEUS MIRABILIS (A)   Final   Glucose-Capillary 02/12/2022 128 (H)  70 - 99 mg/dL Final   Glucose reference range applies only to samples taken after fasting for at least 8 hours.   Glucose-Capillary 02/12/2022 196 (H)  70 - 99 mg/dL Final   Glucose reference range applies only to samples taken after fasting for at least 8 hours.   Glucose-Capillary 02/13/2022 168 (H)  70 - 99 mg/dL Final   Glucose reference range applies only to samples taken after fasting for at least 8 hours.   Glucose-Capillary 02/13/2022 153 (H)  70 - 99 mg/dL Final   Glucose reference range applies only to samples taken after fasting for at least 8 hours.   Glucose-Capillary 02/13/2022 134 (H)  70 - 99 mg/dL Final   Glucose reference range applies only to samples taken after fasting for at least 8 hours.   Glucose-Capillary 02/13/2022 178 (H)  70 - 99 mg/dL Final   Glucose reference range applies only to samples taken after fasting for at least 8 hours.   Glucose-Capillary 02/14/2022 156 (H)  70 - 99 mg/dL Final   Glucose reference range applies only to samples taken after fasting for at least 8 hours.   Glucose-Capillary 02/14/2022 169 (H)  70 - 99 mg/dL Final   Glucose reference range applies only to samples taken after fasting for at least 8 hours.   Glucose-Capillary 02/14/2022 156 (H)  70 - 99 mg/dL Final   Glucose reference range applies only to samples taken after fasting for at least 8 hours.   Glucose-Capillary 02/14/2022 148 (H)  70 - 99 mg/dL Final   Glucose reference range applies only to samples taken after fasting for at least 8 hours.   Glucose-Capillary 02/15/2022 159 (H)  70 - 99 mg/dL Final    Glucose reference range  applies only to samples taken after fasting for at least 8 hours.   Glucose-Capillary 02/15/2022 125 (H)  70 - 99 mg/dL Final   Glucose reference range applies only to samples taken after fasting for at least 8 hours.   Glucose-Capillary 02/15/2022 142 (H)  70 - 99 mg/dL Final   Glucose reference range applies only to samples taken after fasting for at least 8 hours.   Glucose-Capillary 02/15/2022 128 (H)  70 - 99 mg/dL Final   Glucose reference range applies only to samples taken after fasting for at least 8 hours.   Glucose-Capillary 02/16/2022 137 (H)  70 - 99 mg/dL Final   Glucose reference range applies only to samples taken after fasting for at least 8 hours.   WBC 02/16/2022 6.0  4.0 - 10.5 K/uL Final   RBC 02/16/2022 4.48  3.87 - 5.11 MIL/uL Final   Hemoglobin 02/16/2022 10.7 (L)  12.0 - 15.0 g/dL Final   HCT 52/84/1324 35.4 (L)  36.0 - 46.0 % Final   MCV 02/16/2022 79.0 (L)  80.0 - 100.0 fL Final   MCH 02/16/2022 23.9 (L)  26.0 - 34.0 pg Final   MCHC 02/16/2022 30.2  30.0 - 36.0 g/dL Final   RDW 40/11/2723 17.0 (H)  11.5 - 15.5 % Final   Platelets 02/16/2022 387  150 - 400 K/uL Final   nRBC 02/16/2022 0.0  0.0 - 0.2 % Final   Neutrophils Relative % 02/16/2022 54  % Final   Neutro Abs 02/16/2022 3.2  1.7 - 7.7 K/uL Final   Lymphocytes Relative 02/16/2022 35  % Final   Lymphs Abs 02/16/2022 2.1  0.7 - 4.0 K/uL Final   Monocytes Relative 02/16/2022 6  % Final   Monocytes Absolute 02/16/2022 0.4  0.1 - 1.0 K/uL Final   Eosinophils Relative 02/16/2022 2  % Final   Eosinophils Absolute 02/16/2022 0.1  0.0 - 0.5 K/uL Final   Basophils Relative 02/16/2022 2  % Final   Basophils Absolute 02/16/2022 0.1  0.0 - 0.1 K/uL Final   Immature Granulocytes 02/16/2022 1  % Final   Abs Immature Granulocytes 02/16/2022 0.04  0.00 - 0.07 K/uL Final   Performed at [facility], [address] [place], [place] [address]   Glucose-Capillary 02/16/2022 131 (H)  70 - 99  mg/dL Final   Glucose reference range applies only to samples taken after fasting for at least 8 hours.   Glucose-Capillary 02/16/2022 123 (H)  70 - 99 mg/dL Final   Glucose reference range applies only to samples taken after fasting for at least 8 hours.   Glucose-Capillary 02/16/2022 195 (H)  70 - 99 mg/dL Final   Glucose reference range applies only to samples taken after fasting for at least 8 hours.   Glucose-Capillary 02/17/2022 148 (H)  70 - 99 mg/dL Final   Glucose reference range applies only to samples taken after fasting for at least 8 hours.  Admission on 02/08/2022, Discharged on 02/09/2022  Component Date Value Ref Range Status   B Natriuretic Peptide 02/08/2022 40.2  0.0 - 100.0 pg/mL Final   Performed at [facility], [address] [place], [place] [address]   Troponin I (High Sensitivity) 02/08/2022 4  <18 ng/L Final   Comment: (NOTE) Elevated high sensitivity troponin I (hsTnI) values and significant  changes across serial measurements may suggest ACS but many other  chronic and acute conditions are known to elevate hsTnI results.  Refer to the "Links" section for chest  pain algorithms and additional  guidance. Performed at [facility], [address] [place], [place] [address]    WBC 02/08/2022 8.5  4.0 - 10.5 K/uL Final   RBC 02/08/2022 4.30  3.87 - 5.11 MIL/uL Final   Hemoglobin 02/08/2022 10.7 (L)  12.0 - 15.0 g/dL Final   HCT 19/14/7829 33.1 (L)  36.0 - 46.0 % Final   MCV 02/08/2022 77.0 (L)  80.0 - 100.0 fL Final   MCH 02/08/2022 24.9 (L)  26.0 - 34.0 pg Final   MCHC 02/08/2022 32.3  30.0 - 36.0 g/dL Final   RDW 56/21/3086 16.5 (H)  11.5 - 15.5 % Final   Platelets 02/08/2022 361  150 - 400 K/uL Final   nRBC 02/08/2022 0.0  0.0 - 0.2 % Final   Performed at [facility], [address] [place], [place] [address]   Sodium 02/08/2022 131 (L)  135 - 145 mmol/L Final   Potassium 02/08/2022 4.4  3.5 - 5.1 mmol/L Final   Chloride 02/08/2022 96  (L)  98 - 111 mmol/L Final   CO2 02/08/2022 25  22 - 32 mmol/L Final   Glucose, Bld 02/08/2022 126 (H)  70 - 99 mg/dL Final   Glucose reference range applies only to samples taken after fasting for at least 8 hours.   BUN 02/08/2022 11  6 - 20 mg/dL Final   Creatinine, Ser 02/08/2022 0.52  0.44 - 1.00 mg/dL Final   Calcium 96/29/5284 9.4  8.9 - 10.3 mg/dL Final   Total Protein 13/24/4010 7.5  6.5 - 8.1 g/dL Final   Albumin 27/25/3664 3.6  3.5 - 5.0 g/dL Final   AST 40/34/7425 22  15 - 41 U/L Final   ALT 02/08/2022 20  0 - 44 U/L Final   Alkaline Phosphatase 02/08/2022 67  38 - 126 U/L Final   Total Bilirubin 02/08/2022 0.1 (L)  0.3 - 1.2 mg/dL Final   GFR, Estimated 02/08/2022 >60  >60 mL/min Final   Comment: (NOTE) Calculated using the CKD-EPI Creatinine Equation (2021)    Anion gap 02/08/2022 10  5 - 15 Final   Performed at [facility], [address], [place], [place] [address]   Troponin I (High Sensitivity) 02/08/2022 5  <18 ng/L Final   Comment: (NOTE) Elevated high sensitivity troponin I (hsTnI) values and significant  changes across serial measurements may suggest ACS but many other  chronic and acute conditions are known to elevate hsTnI results.  Refer to the "Links" section for chest pain algorithms and additional  guidance. Performed at [facility], [address] [place], [place] [address]    WBC 02/09/2022 7.3  4.0 - 10.5 K/uL Final   RBC 02/09/2022 4.70  3.87 - 5.11 MIL/uL Final   Hemoglobin 02/09/2022 11.4 (L)  12.0 - 15.0 g/dL Final   HCT 32/95/1884 36.6  36.0 - 46.0 % Final   MCV 02/09/2022 77.9 (L)  80.0 - 100.0 fL Final   MCH 02/09/2022 24.3 (L)  26.0 - 34.0 pg Final   MCHC 02/09/2022 31.1  30.0 - 36.0 g/dL Final   RDW 16/60/6301 16.6 (H)  11.5 - 15.5 % Final   Platelets 02/09/2022 403 (H)  150 - 400 K/uL Final   nRBC 02/09/2022 0.0  0.0 - 0.2 % Final   Performed at [facility], [address] [place], [place] [address]    Glucose-Capillary 02/08/2022 117 (H)  70 - 99 mg/dL Final   Glucose  reference range applies only to samples taken after fasting for at least 8 hours.   Sodium 02/09/2022 138  135 - 145 mmol/L Final   Potassium 02/09/2022 3.7  3.5 - 5.1 mmol/L Final   Chloride 02/09/2022 103  98 - 111 mmol/L Final   CO2 02/09/2022 24  22 - 32 mmol/L Final   Glucose, Bld 02/09/2022 134 (H)  70 - 99 mg/dL Final   Glucose reference range applies only to samples taken after fasting for at least 8 hours.   BUN 02/09/2022 9  6 - 20 mg/dL Final   Creatinine, Ser 02/09/2022 0.44  0.44 - 1.00 mg/dL Final   Calcium 16/11/9602 8.3 (L)  8.9 - 10.3 mg/dL Final   GFR, Estimated 02/09/2022 >60  >60 mL/min Final   Comment: (NOTE) Calculated using the CKD-EPI Creatinine Equation (2021)    Anion gap 02/09/2022 11  5 - 15 Final   Performed at [facility], [address], [place], [place] [address]   Iron 02/09/2022 20 (L)  28 - 170 ug/dL Final   TIBC 11/91/4782 455 (H)  250 - 450 ug/dL Final   Saturation Ratios 02/09/2022 4 (L)  10.4 - 31.8 % Final   UIBC 02/09/2022 435  ug/dL Final   Performed at [facility], [address] [place], [place] [address]   Ferritin 02/09/2022 2 (L)  11 - 307 ng/mL Final   Performed at [facility], [address], [place], [place] [address]   Weight 02/09/2022 2,846.58  oz Final   Height 02/09/2022 62  in Final   BP 02/09/2022 143/80  mmHg Final   WBC 02/09/2022 8.1  4.0 - 10.5 K/uL Final   RBC 02/09/2022 4.38  3.87 - 5.11 MIL/uL Final   Hemoglobin 02/09/2022 10.9 (L)  12.0 - 15.0 g/dL Final   HCT 78/46/9629 33.3 (L)  36.0 - 46.0 % Final   MCV 02/09/2022 76.0 (L)  80.0 - 100.0 fL Final   MCH 02/09/2022 24.9 (L)  26.0 - 34.0 pg Final   MCHC 02/09/2022 32.7  30.0 - 36.0 g/dL Final   RDW 52/84/1324 16.5 (H)  11.5 - 15.5 % Final   Platelets 02/09/2022 397  150 - 400 K/uL Final   nRBC 02/09/2022 0.0  0.0 - 0.2 % Final   Neutrophils Relative % 02/09/2022 66  % Final    Neutro Abs 02/09/2022 5.4  1.7 - 7.7 K/uL Final   Lymphocytes Relative 02/09/2022 24  % Final   Lymphs Abs 02/09/2022 1.9  0.7 - 4.0 K/uL Final   Monocytes Relative 02/09/2022 8  % Final   Monocytes Absolute 02/09/2022 0.6  0.1 - 1.0 K/uL Final   Eosinophils Relative 02/09/2022 1  % Final   Eosinophils Absolute 02/09/2022 0.1  0.0 - 0.5 K/uL Final   Basophils Relative 02/09/2022 1  % Final   Basophils Absolute 02/09/2022 0.1  0.0 - 0.1 K/uL Final   Immature Granulocytes 02/09/2022 0  % Final   Abs Immature Granulocytes 02/09/2022 0.03  0.00 - 0.07 K/uL Final   Performed at [facility], [address] [place], [place] [address]   Glucose-Capillary 02/09/2022 238 (H)  70 - 99 mg/dL Final   Glucose reference range applies only to samples taken after fasting for at least 8 hours.   Glucose-Capillary 02/09/2022 91  70 - 99 mg/dL Final   Glucose reference range applies only to samples taken after fasting for at least 8 hours.  CRP 02/09/2022 <0.5  <1.0 mg/dL Final   Performed at [facility], [address] [place], [place] [address]   Sed Rate 02/09/2022 15  0 - 22 mm/hr Final   Performed at [facility], [address] [place], [place] [address]   Glucose-Capillary 02/09/2022 137 (H)  70 - 99 mg/dL Final   Glucose reference range applies only to samples taken after fasting for at least 8 hours.  Admission on 01/24/2022, Discharged on 02/08/2022  Component Date Value Ref Range Status   Glucose-Capillary 01/24/2022 143 (H)  70 - 99 mg/dL Final   Glucose reference range applies only to samples taken after fasting for at least 8 hours.   Glucose-Capillary 01/24/2022 120 (H)  70 - 99 mg/dL Final   Glucose reference range applies only to samples taken after fasting for at least 8 hours.   Glucose-Capillary 01/25/2022 122 (H)  70 - 99 mg/dL Final   Glucose reference range applies only to samples taken after fasting for at least 8 hours.   Glucose-Capillary 01/25/2022 190 (H)   70 - 99 mg/dL Final   Glucose reference range applies only to samples taken after fasting for at least 8 hours.   Glucose-Capillary 01/25/2022 163 (H)  70 - 99 mg/dL Final   Glucose reference range applies only to samples taken after fasting for at least 8 hours.   WBC 01/26/2022 6.5  4.0 - 10.5 K/uL Final   RBC 01/26/2022 4.53  3.87 - 5.11 MIL/uL Final   Hemoglobin 01/26/2022 11.3 (L)  12.0 - 15.0 g/dL Final   HCT 08/65/784611/28/2023 36.4  36.0 - 46.0 % Final   MCV 01/26/2022 80.4  80.0 - 100.0 fL Final   MCH 01/26/2022 24.9 (L)  26.0 - 34.0 pg Final   MCHC 01/26/2022 31.0  30.0 - 36.0 g/dL Final   RDW 96/29/528411/28/2023 17.3 (H)  11.5 - 15.5 % Final   Platelets 01/26/2022 327  150 - 400 K/uL Final   nRBC 01/26/2022 0.0  0.0 - 0.2 % Final   Neutrophils Relative % 01/26/2022 60  % Final   Neutro Abs 01/26/2022 3.9  1.7 - 7.7 K/uL Final   Lymphocytes Relative 01/26/2022 31  % Final   Lymphs Abs 01/26/2022 2.0  0.7 - 4.0 K/uL Final   Monocytes Relative 01/26/2022 7  % Final   Monocytes Absolute 01/26/2022 0.5  0.1 - 1.0 K/uL Final   Eosinophils Relative 01/26/2022 1  % Final   Eosinophils Absolute 01/26/2022 0.1  0.0 - 0.5 K/uL Final   Basophils Relative 01/26/2022 1  % Final   Basophils Absolute 01/26/2022 0.1  0.0 - 0.1 K/uL Final   Immature Granulocytes 01/26/2022 0  % Final   Abs Immature Granulocytes 01/26/2022 0.02  0.00 - 0.07 K/uL Final   Performed at [facility], [address] [place], [place] [address]   Glucose-Capillary 01/26/2022 149 (H)  70 - 99 mg/dL Final   Glucose reference range applies only to samples taken after fasting for at least 8 hours.   Glucose-Capillary 01/26/2022 130 (H)  70 - 99 mg/dL Final   Glucose reference range applies only to samples taken after fasting for at least 8 hours.   Glucose-Capillary 01/26/2022 151 (H)  70 - 99 mg/dL Final   Glucose reference range applies only to samples taken after fasting for at least 8 hours.   Glucose-Capillary 01/26/2022  170 (H)  70 - 99 mg/dL Final   Glucose reference range applies  only to samples taken after fasting for at least 8 hours.   Glucose-Capillary 01/27/2022 129 (H)  70 - 99 mg/dL Final   Glucose reference range applies only to samples taken after fasting for at least 8 hours.   Glucose-Capillary 01/27/2022 101 (H)  70 - 99 mg/dL Final   Glucose reference range applies only to samples taken after fasting for at least 8 hours.   Glucose-Capillary 01/27/2022 220 (H)  70 - 99 mg/dL Final   Glucose reference range applies only to samples taken after fasting for at least 8 hours.   Glucose-Capillary 01/27/2022 154 (H)  70 - 99 mg/dL Final   Glucose reference range applies only to samples taken after fasting for at least 8 hours.   Glucose-Capillary 01/27/2022 163 (H)  70 - 99 mg/dL Final   Glucose reference range applies only to samples taken after fasting for at least 8 hours.   Glucose-Capillary 01/28/2022 127 (H)  70 - 99 mg/dL Final   Glucose reference range applies only to samples taken after fasting for at least 8 hours.   Glucose-Capillary 01/28/2022 110 (H)  70 - 99 mg/dL Final   Glucose reference range applies only to samples taken after fasting for at least 8 hours.   Glucose-Capillary 01/28/2022 167 (H)  70 - 99 mg/dL Final   Glucose reference range applies only to samples taken after fasting for at least 8 hours.   Glucose-Capillary 01/29/2022 145 (H)  70 - 99 mg/dL Final   Glucose reference range applies only to samples taken after fasting for at least 8 hours.   Glucose-Capillary 01/29/2022 203 (H)  70 - 99 mg/dL Final   Glucose reference range applies only to samples taken after fasting for at least 8 hours.   Glucose-Capillary 01/29/2022 153 (H)  70 - 99 mg/dL Final   Glucose reference range applies only to samples taken after fasting for at least 8 hours.   Glucose-Capillary 01/29/2022 166 (H)  70 - 99 mg/dL Final   Glucose reference range applies only to samples taken after fasting  for at least 8 hours.   Glucose-Capillary 01/30/2022 142 (H)  70 - 99 mg/dL Final   Glucose reference range applies only to samples taken after fasting for at least 8 hours.   Glucose-Capillary 01/30/2022 138 (H)  70 - 99 mg/dL Final   Glucose reference range applies only to samples taken after fasting for at least 8 hours.   Glucose-Capillary 01/30/2022 185 (H)  70 - 99 mg/dL Final   Glucose reference range applies only to samples taken after fasting for at least 8 hours.   Glucose-Capillary 01/30/2022 220 (H)  70 - 99 mg/dL Final   Glucose reference range applies only to samples taken after fasting for at least 8 hours.   Glucose-Capillary 01/31/2022 147 (H)  70 - 99 mg/dL Final   Glucose reference range applies only to samples taken after fasting for at least 8 hours.   Glucose-Capillary 01/31/2022 131 (H)  70 - 99 mg/dL Final   Glucose reference range applies only to samples taken after fasting for at least 8 hours.   Glucose-Capillary 01/31/2022 169 (H)  70 - 99 mg/dL Final   Glucose reference range applies only to samples taken after fasting for at least 8 hours.   Glucose-Capillary 01/31/2022 144 (H)  70 - 99 mg/dL Final   Glucose reference range applies only to samples taken after fasting for at least 8 hours.   Comment 1 01/31/2022 RN AWARE  Final   Glucose-Capillary 02/01/2022 117 (H)  70 - 99 mg/dL Final   Glucose reference range applies only to samples taken after fasting for at least 8 hours.   Glucose-Capillary 02/01/2022 180 (H)  70 - 99 mg/dL Final   Glucose reference range applies only to samples taken after fasting for at least 8 hours.   Glucose-Capillary 02/01/2022 209 (H)  70 - 99 mg/dL Final   Glucose reference range applies only to samples taken after fasting for at least 8 hours.   Glucose-Capillary 02/02/2022 131 (H)  70 - 99 mg/dL Final   Glucose reference range applies only to samples taken after fasting for at least 8 hours.   WBC 02/02/2022 7.5  4.0 - 10.5 K/uL  Final   RBC 02/02/2022 4.52  3.87 - 5.11 MIL/uL Final   Hemoglobin 02/02/2022 11.3 (L)  12.0 - 15.0 g/dL Final   HCT 44/03/270 35.0 (L)  36.0 - 46.0 % Final   MCV 02/02/2022 77.4 (L)  80.0 - 100.0 fL Final   MCH 02/02/2022 25.0 (L)  26.0 - 34.0 pg Final   MCHC 02/02/2022 32.3  30.0 - 36.0 g/dL Final   RDW 53/66/4403 16.7 (H)  11.5 - 15.5 % Final   Platelets 02/02/2022 345  150 - 400 K/uL Final   nRBC 02/02/2022 0.0  0.0 - 0.2 % Final   Neutrophils Relative % 02/02/2022 63  % Final   Neutro Abs 02/02/2022 4.7  1.7 - 7.7 K/uL Final   Lymphocytes Relative 02/02/2022 28  % Final   Lymphs Abs 02/02/2022 2.1  0.7 - 4.0 K/uL Final   Monocytes Relative 02/02/2022 7  % Final   Monocytes Absolute 02/02/2022 0.5  0.1 - 1.0 K/uL Final   Eosinophils Relative 02/02/2022 1  % Final   Eosinophils Absolute 02/02/2022 0.1  0.0 - 0.5 K/uL Final   Basophils Relative 02/02/2022 1  % Final   Basophils Absolute 02/02/2022 0.1  0.0 - 0.1 K/uL Final   Immature Granulocytes 02/02/2022 0  % Final   Abs Immature Granulocytes 02/02/2022 0.03  0.00 - 0.07 K/uL Final   Performed at [facility], [address], [place], [place] [address]   Glucose-Capillary 02/02/2022 95  70 - 99 mg/dL Final   Glucose reference range applies only to samples taken after fasting for at least 8 hours.   Glucose-Capillary 02/02/2022 120 (H)  70 - 99 mg/dL Final   Glucose reference range applies only to samples taken after fasting for at least 8 hours.   Glucose-Capillary 02/02/2022 191 (H)  70 - 99 mg/dL Final   Glucose reference range applies only to samples taken after fasting for at least 8 hours.   Glucose-Capillary 02/03/2022 148 (H)  70 - 99 mg/dL Final   Glucose reference range applies only to samples taken after fasting for at least 8 hours.   Glucose-Capillary 02/03/2022 122 (H)  70 - 99 mg/dL Final   Glucose reference range applies only to samples taken after fasting for at least 8 hours.   Glucose-Capillary  02/03/2022 233 (H)  70 - 99 mg/dL Final   Glucose reference range applies only to samples taken after fasting for at least 8 hours.   Glucose-Capillary 02/04/2022 126 (H)  70 - 99 mg/dL Final   Glucose reference range applies only to samples taken after fasting for at least 8 hours.   Glucose-Capillary 02/04/2022 117 (H)  70 - 99 mg/dL Final   Glucose reference range applies only to samples  taken after fasting for at least 8 hours.   Glucose-Capillary 02/04/2022 135 (H)  70 - 99 mg/dL Final   Glucose reference range applies only to samples taken after fasting for at least 8 hours.   Glucose-Capillary 02/04/2022 197 (H)  70 - 99 mg/dL Final   Glucose reference range applies only to samples taken after fasting for at least 8 hours.   Glucose-Capillary 02/05/2022 170 (H)  70 - 99 mg/dL Final   Glucose reference range applies only to samples taken after fasting for at least 8 hours.   Glucose-Capillary 02/05/2022 107 (H)  70 - 99 mg/dL Final   Glucose reference range applies only to samples taken after fasting for at least 8 hours.   Glucose-Capillary 02/05/2022 184 (H)  70 - 99 mg/dL Final   Glucose reference range applies only to samples taken after fasting for at least 8 hours.   Glucose-Capillary 02/05/2022 256 (H)  70 - 99 mg/dL Final   Glucose reference range applies only to samples taken after fasting for at least 8 hours.   Glucose-Capillary 02/06/2022 144 (H)  70 - 99 mg/dL Final   Glucose reference range applies only to samples taken after fasting for at least 8 hours.   Glucose-Capillary 02/06/2022 190 (H)  70 - 99 mg/dL Final   Glucose reference range applies only to samples taken after fasting for at least 8 hours.   Glucose-Capillary 02/06/2022 92  70 - 99 mg/dL Final   Glucose reference range applies only to samples taken after fasting for at least 8 hours.   Trichomonas 02/06/2022 Negative   Final   Bacterial Vaginitis-Urine 02/06/2022 Negative   Final   Molecular Comment  02/06/2022 For tests bacteria and/or candida, this specimen does not meet the   Final   Molecular Comment 02/06/2022 strict criteria set by the FDA. The result interpretation should be   Final   Molecular Comment 02/06/2022 considered in conjunction with the patient's clinical history.   Final   Comment 02/06/2022 Normal Reference Range Trichomonas - Negative   Final   Glucose-Capillary 02/06/2022 197 (H)  70 - 99 mg/dL Final   Glucose reference range applies only to samples taken after fasting for at least 8 hours.   Glucose-Capillary 02/07/2022 129 (H)  70 - 99 mg/dL Final   Glucose reference range applies only to samples taken after fasting for at least 8 hours.   Glucose-Capillary 02/07/2022 207 (H)  70 - 99 mg/dL Final   Glucose reference range applies only to samples taken after fasting for at least 8 hours.   Glucose-Capillary 02/07/2022 153 (H)  70 - 99 mg/dL Final   Glucose reference range applies only to samples taken after fasting for at least 8 hours.   Glucose-Capillary 02/08/2022 129 (H)  70 - 99 mg/dL Final   Glucose reference range applies only to samples taken after fasting for at least 8 hours.   Glucose-Capillary 02/08/2022 107 (H)  70 - 99 mg/dL Final   Glucose reference range applies only to samples taken after fasting for at least 8 hours.  Admission on 01/23/2022, Discharged on 01/24/2022  Component Date Value Ref Range Status   Sodium 01/23/2022 140  135 - 145 mmol/L Final   Potassium 01/23/2022 4.4  3.5 - 5.1 mmol/L Final   Chloride 01/23/2022 101  98 - 111 mmol/L Final   CO2 01/23/2022 25  22 - 32 mmol/L Final   Glucose, Bld 01/23/2022 198 (H)  70 - 99 mg/dL Final  Glucose reference range applies only to samples taken after fasting for at least 8 hours.   BUN 01/23/2022 13  6 - 20 mg/dL Final   Creatinine, Ser 01/23/2022 0.62  0.44 - 1.00 mg/dL Final   Calcium 53/66/4403 9.3  8.9 - 10.3 mg/dL Final   GFR, Estimated 01/23/2022 >60  >60 mL/min Final   Comment:  (NOTE) Calculated using the CKD-EPI Creatinine Equation (2021)    Anion gap 01/23/2022 14  5 - 15 Final   Performed at [facility], [address], [place], [place] [address]   WBC 01/23/2022 5.8  4.0 - 10.5 K/uL Final   RBC 01/23/2022 4.63  3.87 - 5.11 MIL/uL Final   Hemoglobin 01/23/2022 11.7 (L)  12.0 - 15.0 g/dL Final   HCT 95/63/8756 37.4  36.0 - 46.0 % Final   MCV 01/23/2022 80.8  80.0 - 100.0 fL Final   MCH 01/23/2022 25.3 (L)  26.0 - 34.0 pg Final   MCHC 01/23/2022 31.3  30.0 - 36.0 g/dL Final   RDW 43/32/9518 17.9 (H)  11.5 - 15.5 % Final   Platelets 01/23/2022 333  150 - 400 K/uL Final   nRBC 01/23/2022 0.0  0.0 - 0.2 % Final   Performed at [facility], [address] [place], [place] [address]   Troponin I (High Sensitivity) 01/23/2022 6  <18 ng/L Final   Comment: (NOTE) Elevated high sensitivity troponin I (hsTnI) values and significant  changes across serial measurements may suggest ACS but many other  chronic and acute conditions are known to elevate hsTnI results.  Refer to the "Links" section for chest pain algorithms and additional  guidance. Performed at [facility], [address], [place], [place] [address]    Troponin I (High Sensitivity) 01/23/2022 5  <18 ng/L Final   Comment: (NOTE) Elevated high sensitivity troponin I (hsTnI) values and significant  changes across serial measurements may suggest ACS but many other  chronic and acute conditions are known to elevate hsTnI results.  Refer to the "Links" section for chest pain algorithms and additional  guidance. Performed at [facility], [address], [place], [place] [address]    SARS Coronavirus 2 by RT PCR 01/23/2022 NEGATIVE  NEGATIVE Final   Comment: (NOTE) SARS-CoV-2 target nucleic acids are NOT DETECTED.  The SARS-CoV-2 RNA is generally detectable in upper and lower respiratory specimens during the acute phase of infection. The lowest concentration of SARS-CoV-2  viral copies this assay can detect is 250 copies / mL. A negative result does not preclude SARS-CoV-2 infection and should not be used as the sole basis for treatment or other patient management decisions.  A negative result may occur with improper specimen collection / handling, submission of specimen other than nasopharyngeal swab, presence of viral mutation(s) within the areas targeted by this assay, and inadequate number of viral copies (<250 copies / mL). A negative result must be combined with clinical observations, patient history, and epidemiological information.  Fact Sheet for Patients:   RoadLapTop.co.za  Fact Sheet for Healthcare Providers: http://kim-miller.com/  This test is not yet approved or                           cleared by the [place] FDA and has been authorized for detection and/or diagnosis of SARS-CoV-2 by FDA under an Emergency Use Authorization (EUA).  This EUA will remain in effect (meaning this test can be used)  for the duration of the COVID-19 declaration under Section 564(b)(1) of the Act, 21 U.S.C. section 360bbb-3(b)(1), unless the authorization is terminated or revoked sooner.  Performed at [facility], [address], [place], [place] [address]    B Natriuretic Peptide 01/23/2022 41.3  0.0 - 100.0 pg/mL Final   Performed at [facility], [address], [place], [place] [address]   Group A Strep by PCR 01/23/2022 NOT DETECTED  NOT DETECTED Final   Performed at [facility], [address] [place], [place] [address]   Color, Urine 01/23/2022 YELLOW  YELLOW Final   APPearance 01/23/2022 CLEAR  CLEAR Final   Specific Gravity, Urine 01/23/2022 1.018  1.005 - 1.030 Final   pH 01/23/2022 7.0  5.0 - 8.0 Final   Glucose, UA 01/23/2022 NEGATIVE  NEGATIVE mg/dL Final   Hgb urine dipstick 01/23/2022 NEGATIVE  NEGATIVE Final   Bilirubin Urine 01/23/2022 NEGATIVE  NEGATIVE Final    Ketones, ur 01/23/2022 NEGATIVE  NEGATIVE mg/dL Final   Protein, ur 91/47/8295 NEGATIVE  NEGATIVE mg/dL Final   Nitrite 62/13/0865 NEGATIVE  NEGATIVE Final   Leukocytes,Ua 01/23/2022 TRACE (A)  NEGATIVE Final   RBC / HPF 01/23/2022 0-5  0 - 5 RBC/hpf Final   WBC, UA 01/23/2022 0-5  0 - 5 WBC/hpf Final   Bacteria, UA 01/23/2022 NONE SEEN  NONE SEEN Final   Squamous Epithelial / HPF 01/23/2022 0-5  0 - 5 Final   Mucus 01/23/2022 PRESENT   Final   Performed at [facility], [address], [place], [place] [address]   SARS Coronavirus 2 by RT PCR 01/23/2022 NEGATIVE  NEGATIVE Final   Comment: (NOTE) SARS-CoV-2 target nucleic acids are NOT DETECTED.  The SARS-CoV-2 RNA is generally detectable in upper respiratory specimens during the acute phase of infection. The lowest concentration of SARS-CoV-2 viral copies this assay can detect is 138 copies/mL. A negative result does not preclude SARS-Cov-2 infection and should not be used as the sole basis for treatment or other patient management decisions. A negative result may occur with  improper specimen collection/handling, submission of specimen other than nasopharyngeal swab, presence of viral mutation(s) within the areas targeted by this assay, and inadequate number of viral copies(<138 copies/mL). A negative result must be combined with clinical observations, patient history, and epidemiological information. The expected result is Negative.  Fact Sheet for Patients:  BloggerCourse.com  Fact Sheet for Healthcare Providers:  SeriousBroker.it  This test is no                          t yet approved or cleared by the [place] FDA and  has been authorized for detection and/or diagnosis of SARS-CoV-2 by FDA under an Emergency Use Authorization (EUA). This EUA will remain  in effect (meaning this test can be used) for the duration of the COVID-19 declaration under Section 564(b)(1)  of the Act, 21 U.S.C.section 360bbb-3(b)(1), unless the authorization is terminated  or revoked sooner.       Influenza A by PCR 01/23/2022 NEGATIVE  NEGATIVE Final   Influenza B by PCR 01/23/2022 NEGATIVE  NEGATIVE Final   Comment: (NOTE) The Xpert Xpress SARS-CoV-2/FLU/RSV plus assay is intended as an aid in the diagnosis of influenza from Nasopharyngeal swab specimens and should not be used as a sole basis for treatment. Nasal washings and aspirates are unacceptable for Xpert Xpress SARS-CoV-2/FLU/RSV testing.  Fact Sheet for Patients: BloggerCourse.com  Fact Sheet for Healthcare Providers: SeriousBroker.it  This test is not yet approved or cleared by the [place] FDA and has been authorized for detection and/or diagnosis of SARS-CoV-2 by FDA under an Emergency Use Authorization (EUA). This EUA will remain in effect (meaning this test can be used) for the duration of the COVID-19 declaration under Section 564(b)(1) of the Act, 21 U.S.C. section 360bbb-3(b)(1), unless the authorization is terminated or revoked.  Performed at [facility], [address] [place], [place] [address]    WBC 01/24/2022 6.1  4.0 - 10.5 K/uL Final   RBC 01/24/2022 4.60  3.87 - 5.11 MIL/uL Final   Hemoglobin 01/24/2022 11.7 (L)  12.0 - 15.0 g/dL Final   HCT 60/45/4098 37.0  36.0 - 46.0 % Final   MCV 01/24/2022 80.4  80.0 - 100.0 fL Final   MCH 01/24/2022 25.4 (L)  26.0 - 34.0 pg Final   MCHC 01/24/2022 31.6  30.0 - 36.0 g/dL Final   RDW 11/91/4782 17.6 (H)  11.5 - 15.5 % Final   Platelets 01/24/2022 334  150 - 400 K/uL Final   nRBC 01/24/2022 0.0  0.0 - 0.2 % Final   Neutrophils Relative % 01/24/2022 53  % Final   Neutro Abs 01/24/2022 3.3  1.7 - 7.7 K/uL Final   Lymphocytes Relative 01/24/2022 33  % Final   Lymphs Abs 01/24/2022 2.0  0.7 - 4.0 K/uL Final   Monocytes Relative 01/24/2022 11  % Final   Monocytes Absolute 01/24/2022  0.7  0.1 - 1.0 K/uL Final   Eosinophils Relative 01/24/2022 2  % Final   Eosinophils Absolute 01/24/2022 0.1  0.0 - 0.5 K/uL Final   Basophils Relative 01/24/2022 1  % Final   Basophils Absolute 01/24/2022 0.1  0.0 - 0.1 K/uL Final   Immature Granulocytes 01/24/2022 0  % Final   Abs Immature Granulocytes 01/24/2022 0.02  0.00 - 0.07 K/uL Final   Performed at [facility], [address] [place], [place] [address]   Glucose-Capillary 01/24/2022 110 (H)  70 - 99 mg/dL Final   Glucose reference range applies only to samples taken after fasting for at least 8 hours.  No results displayed because visit has over 200 results.    Admission on 11/22/2021, Discharged on 11/22/2021  Component Date Value Ref Range Status   Glucose-Capillary 11/22/2021 169 (H)  70 - 99 mg/dL Final   Glucose reference range applies only to samples taken after fasting for at least 8 hours.  No results displayed because visit has over 200 results.    Admission on 10/04/2021, Discharged on 10/22/2021  Component Date Value Ref Range Status   SARS Coronavirus 2 by RT PCR 10/04/2021 NEGATIVE  NEGATIVE Final   Comment: (NOTE) SARS-CoV-2 target nucleic acids are NOT DETECTED.  The SARS-CoV-2 RNA is generally detectable in upper respiratory specimens during the acute phase of infection. The lowest concentration of SARS-CoV-2 viral copies this assay can detect is 138 copies/mL. A negative result does not preclude SARS-Cov-2 infection and should not be used as the sole basis for treatment or other patient management decisions. A negative result may occur with  improper specimen collection/handling, submission of specimen other than nasopharyngeal swab, presence of viral mutation(s) within the areas targeted by this assay, and inadequate number of viral copies(<138 copies/mL). A negative result must be combined with clinical observations, patient history, and epidemiological information. The expected result is  Negative.  Fact Sheet for Patients:  BloggerCourse.com  Fact Sheet  for Healthcare Providers:  SeriousBroker.ithttps://www.fda.gov/media/152162/download  This test is no                          t yet approved or cleared by the [place] FDA and  has been authorized for detection and/or diagnosis of SARS-CoV-2 by FDA under an Emergency Use Authorization (EUA). This EUA will remain  in effect (meaning this test can be used) for the duration of the COVID-19 declaration under Section 564(b)(1) of the Act, 21 U.S.C.section 360bbb-3(b)(1), unless the authorization is terminated  or revoked sooner.       Influenza A by PCR 10/04/2021 NEGATIVE  NEGATIVE Final   Influenza B by PCR 10/04/2021 NEGATIVE  NEGATIVE Final   Comment: (NOTE) The Xpert Xpress SARS-CoV-2/FLU/RSV plus assay is intended as an aid in the diagnosis of influenza from Nasopharyngeal swab specimens and should not be used as a sole basis for treatment. Nasal washings and aspirates are unacceptable for Xpert Xpress SARS-CoV-2/FLU/RSV testing.  Fact Sheet for Patients: BloggerCourse.comhttps://www.fda.gov/media/152166/download  Fact Sheet for Healthcare Providers: SeriousBroker.ithttps://www.fda.gov/media/152162/download  This test is not yet approved or cleared by the [place] FDA and has been authorized for detection and/or diagnosis of SARS-CoV-2 by FDA under an Emergency Use Authorization (EUA). This EUA will remain in effect (meaning this test can be used) for the duration of the COVID-19 declaration under Section 564(b)(1) of the Act, 21 U.S.C. section 360bbb-3(b)(1), unless the authorization is terminated or revoked.  Performed at [facility], [address] [place], [place] [address]    WBC 10/04/2021 8.4  4.0 - 10.5 K/uL Final   RBC 10/04/2021 4.42  3.87 - 5.11 MIL/uL Final   Hemoglobin 10/04/2021 11.6 (L)  12.0 - 15.0 g/dL Final   HCT 34/74/259508/07/2021 35.7 (L)  36.0 - 46.0 % Final   MCV 10/04/2021 80.8  80.0 - 100.0  fL Final   MCH 10/04/2021 26.2  26.0 - 34.0 pg Final   MCHC 10/04/2021 32.5  30.0 - 36.0 g/dL Final   RDW 63/87/564308/07/2021 16.0 (H)  11.5 - 15.5 % Final   Platelets 10/04/2021 372  150 - 400 K/uL Final   nRBC 10/04/2021 0.0  0.0 - 0.2 % Final   Neutrophils Relative % 10/04/2021 68  % Final   Neutro Abs 10/04/2021 5.7  1.7 - 7.7 K/uL Final   Lymphocytes Relative 10/04/2021 22  % Final   Lymphs Abs 10/04/2021 1.8  0.7 - 4.0 K/uL Final   Monocytes Relative 10/04/2021 8  % Final   Monocytes Absolute 10/04/2021 0.7  0.1 - 1.0 K/uL Final   Eosinophils Relative 10/04/2021 1  % Final   Eosinophils Absolute 10/04/2021 0.1  0.0 - 0.5 K/uL Final   Basophils Relative 10/04/2021 1  % Final   Basophils Absolute 10/04/2021 0.1  0.0 - 0.1 K/uL Final   Immature Granulocytes 10/04/2021 0  % Final   Abs Immature Granulocytes 10/04/2021 0.03  0.00 - 0.07 K/uL Final   Performed at [facility], [address] [place], [place] [address]   Sodium 10/04/2021 136  135 - 145 mmol/L Final   Potassium 10/04/2021 4.2  3.5 - 5.1 mmol/L Final   Chloride 10/04/2021 104  98 - 111 mmol/L Final   CO2 10/04/2021 25  22 - 32 mmol/L Final   Glucose, Bld 10/04/2021 100 (H)  70 - 99 mg/dL Final   Glucose reference range applies only to samples taken after fasting for at least 8  hours.   BUN 10/04/2021 9  6 - 20 mg/dL Final   Creatinine, Ser 10/04/2021 0.52  0.44 - 1.00 mg/dL Final   Calcium 16/11/9602 9.0  8.9 - 10.3 mg/dL Final   Total Protein 54/10/8117 7.0  6.5 - 8.1 g/dL Final   Albumin 14/78/2956 3.2 (L)  3.5 - 5.0 g/dL Final   AST 21/30/8657 13 (L)  15 - 41 U/L Final   ALT 10/04/2021 10  0 - 44 U/L Final   Alkaline Phosphatase 10/04/2021 61  38 - 126 U/L Final   Total Bilirubin 10/04/2021 0.3  0.3 - 1.2 mg/dL Final   GFR, Estimated 10/04/2021 >60  >60 mL/min Final   Comment: (NOTE) Calculated using the CKD-EPI Creatinine Equation (2021)    Anion gap 10/04/2021 7  5 - 15 Final   Performed at [facility], [address], [place], [place] [address]   Hgb A1c MFr Bld 10/04/2021 7.0 (H)  4.8 - 5.6 % Final   Comment: (NOTE) Pre diabetes:          5.7%-6.4%  Diabetes:              >6.4%  Glycemic control for   <7.0% adults with diabetes    Mean Plasma Glucose 10/04/2021 154.2  mg/dL Final   Performed at [facility], [address], [place], [place] [address]   Cholesterol 10/04/2021 178  0 - 200 mg/dL Final   Triglycerides 41/32/4401 155 (H)  <150 mg/dL Final   HDL 02/72/5366 52  >40 mg/dL Final   Total CHOL/HDL Ratio 10/04/2021 3.4  RATIO Final   VLDL 10/04/2021 31  0 - 40 mg/dL Final   LDL Cholesterol 10/04/2021 95  0 - 99 mg/dL Final   Comment:        Total Cholesterol/HDL:CHD Risk Coronary Heart Disease Risk Table                     Men   Women  1/2 Average Risk   3.4   3.3  Average Risk       5.0   4.4  2 X Average Risk   9.6   7.1  3 X Average Risk  23.4   11.0        Use the calculated Patient Ratio above and the CHD Risk Table to determine the patient's CHD Risk.        ATP III CLASSIFICATION (LDL):  <100     mg/dL   Optimal  440-347  mg/dL   Near or Above                    Optimal  130-159  mg/dL   Borderline  425-956  mg/dL   High  >387     mg/dL   Very High Performed at [facility], [address], [place], [place] [address]    POC Amphetamine UR 10/04/2021 None Detected  NONE DETECTED (Cut Off Level 1000 ng/mL) Final   POC Secobarbital (BAR) 10/04/2021 None Detected  NONE DETECTED (Cut Off Level 300 ng/mL) Final   POC Buprenorphine (BUP) 10/04/2021 None Detected  NONE DETECTED (Cut Off Level 10 ng/mL) Final   POC Oxazepam (BZO) 10/04/2021 None Detected  NONE DETECTED (Cut Off Level 300 ng/mL) Final   POC Cocaine UR 10/04/2021 None Detected  NONE DETECTED (Cut Off Level 300 ng/mL) Final   POC Methamphetamine UR 10/04/2021 None Detected  NONE DETECTED (Cut Off Level 1000  ng/mL) Final   POC Morphine 10/04/2021 None Detected  NONE DETECTED (Cut Off  Level 300 ng/mL) Final   POC Methadone UR 10/04/2021 None Detected  NONE DETECTED (Cut Off Level 300 ng/mL) Final   POC Oxycodone UR 10/04/2021 Positive (A)  NONE DETECTED (Cut Off Level 100 ng/mL) Final   POC Marijuana UR 10/04/2021 None Detected  NONE DETECTED (Cut Off Level 50 ng/mL) Final   SARSCOV2ONAVIRUS 2 AG 10/04/2021 NEGATIVE  NEGATIVE Final   Comment: (NOTE) SARS-CoV-2 antigen NOT DETECTED.   Negative results are presumptive.  Negative results do not preclude SARS-CoV-2 infection and should not be used as the sole basis for treatment or other patient management decisions, including infection  control decisions, particularly in the presence of clinical signs and  symptoms consistent with COVID-19, or in those who have been in contact with the virus.  Negative results must be combined with clinical observations, patient history, and epidemiological information. The expected result is Negative.  Fact Sheet for Patients: https://www.jennings-kim.com/https://www.fda.gov/media/141569/download  Fact Sheet for Healthcare Providers: https://alexander-rogers.biz/https://www.fda.gov/media/141568/download  This test is not yet approved or cleared by the [place] FDA and  has been authorized for detection and/or diagnosis of SARS-CoV-2 by FDA under an Emergency Use Authorization (EUA).  This EUA will remain in effect (meaning this test can be used) for the duration of  the COV                          ID-19 declaration under Section 564(b)(1) of the Act, 21 U.S.C. section 360bbb-3(b)(1), unless the authorization is terminated or revoked sooner.     Valproic Acid Lvl 10/04/2021 51  50.0 - 100.0 ug/mL Final   Performed at [facility], [address] [place], [place] [address]   Valproic Acid Lvl 10/08/2021 57  50.0 - 100.0 ug/mL Final   Performed at [facility], [address] [place], [place] [address]   Glucose-Capillary 10/09/2021 123 (H)  70 - 99 mg/dL Final   Glucose reference range applies only to samples taken  after fasting for at least 8 hours.  There may be more visits with results that are not included.    Blood Alcohol level:  Lab Results  Component Value Date   ETH <10 11/23/2021   ETH <10 11/08/2021    Metabolic Disorder Labs: Lab Results  Component Value Date   HGBA1C 6.7 (H) 12/28/2021   MPG 145.59 12/28/2021   MPG 154.2 10/04/2021   Lab Results  Component Value Date   PROLACTIN 3.8 (L) 11/23/2021   Lab Results  Component Value Date   CHOL 171 11/23/2021   TRIG 125 11/23/2021   HDL 65 11/23/2021   CHOLHDL 2.6 11/23/2021   VLDL 25 11/23/2021   LDLCALC 81 11/23/2021   LDLCALC 95 10/04/2021    Therapeutic Lab Levels: No results found for: "LITHIUM" Lab Results  Component Value Date   VALPROATE 17 (L) 02/17/2022   VALPROATE 33 (L) 01/18/2022   No results found for: "CBMZ"  Physical Findings   PHQ2-9    Flowsheet Row ED [facility]  PHQ-2 Total Score 2  PHQ-9 Total Score 3      Flowsheet Row ED [facility]20[facility] ED from [facility] from [facility]09/2022 [facility]  C-SSRS RISK CATEGORY No Risk No Risk No Risk  Musculoskeletal  Strength & Muscle Tone: within normal limits Gait & Station: normal Patient leans: N/A  Psychiatric Specialty Exam  Presentation  General Appearance:  Casual; Fairly Groomed; Appropriate for Environment  Eye Contact: Good  Speech: Clear and Coherent  Speech Volume: Normal  Handedness: Right   Mood and Affect  Mood: Euthymic  Affect: Blunt   Thought Process  Thought Processes: Coherent; Goal Directed; Linear  Descriptions of Associations:Intact  Orientation:Full (Time, Place and Person)  Thought Content:Logical  Diagnosis of Schizophrenia or Schizoaffective disorder in past: Yes  Duration of Psychotic Symptoms: No data  recorded  Hallucinations:Hallucinations: None  Ideas of Reference:None  Suicidal Thoughts:Suicidal Thoughts: No  Homicidal Thoughts:Homicidal Thoughts: No   Sensorium  Memory: Immediate Good  Judgment: Intact  Insight: Present   Executive Functions  Concentration: Fair  Attention Span: Fair  Recall: [employer] of Knowledge: Fair  Language: Fair   Psychomotor Activity  Psychomotor Activity: Psychomotor Activity: Normal   Assets  Assets: Communication Skills; Desire for Improvement; Financial Resources/Insurance; Leisure Time; Resilience   Sleep  Sleep: Sleep: Good   No data recorded  Physical Exam  Physical Exam Constitutional:      General: She is not in acute distress.    Appearance: She is not ill-appearing, toxic-appearing or diaphoretic.  Eyes:     General: No scleral icterus. Cardiovascular:     Rate and Rhythm: Tachycardia present.  Pulmonary:     Effort: Pulmonary effort is normal. No respiratory distress.  Neurological:     Mental Status: She is alert and oriented to person, place, and time.  Psychiatric:        Attention and Perception: Attention and perception normal.        Mood and Affect: Mood normal. Affect is blunt.        Speech: Speech normal.        Behavior: Behavior normal. Behavior is cooperative.        Thought Content: Thought content normal.    Review of Systems  Constitutional:  Negative for chills and fever.  Respiratory:  Negative for shortness of breath.   Cardiovascular:  Negative for chest pain and palpitations.  Gastrointestinal:  Negative for abdominal pain.  Neurological:  Negative for headaches.   Blood pressure 136/74, pulse (!) 103, temperature 97.8 F (36.6 C), temperature source Oral, resp. rate 20, SpO2 96 %. There is no height or weight on file to calculate BMI.  Treatment Plan Summary: Plan Pt remains psychiatrically cleared. Pending discharge on 03/15/22 to [facility].    Lauree Chandler, NP 03/10/2022 8:54 AM

## 2022-03-10 NOTE — ED Notes (Signed)
CBCD collected.  Lab transport service contacted to take lab to [facility].

## 2022-03-10 NOTE — ED Notes (Signed)
Pt resting quietly, breathing is even and unlabored.  Pt denies SI, HI, pain and AVH. Pt reports she will have a meeting with SW today around 10 am. Will continue to monitor for safety.

## 2022-03-10 NOTE — Progress Notes (Signed)
PHARMACIST - PHYSICIAN ORDER COMMUNICATION  Teresa Murillo is a 62 y.o. year old female with a history of schizophrenia on Clozapine PTA. Continuing this medication order as an inpatient requires that monitoring parameters per REMS requirements must be met.   Clozapine REMS Dispense Authorization was obtained with RDA# [id number]  Last ANC value and date reported on the Clozapine REMS website: 03/10/22 4800. Next [place] due 03/17/22.  [name] 03/10/2022, 3:09 PM

## 2022-03-10 NOTE — ED Notes (Signed)
Pt sleeping@this time. Breathing even and unlabored. Will continue to monitor for safety 

## 2022-03-10 NOTE — ED Notes (Signed)
Pt asleep at this hour. No apparent distress. RR even and unlabored. Monitored for safety.  

## 2022-03-10 NOTE — ED Notes (Signed)
Snack was given. 

## 2022-03-10 NOTE — ED Notes (Signed)
Pt A&O x 4, no distress noted. Calm & cooperative.  Interactive with staff.  Monitoring for safety.  Denies SI, HI or AVH.

## 2022-03-11 DIAGNOSIS — F209 Schizophrenia, unspecified: Secondary | ICD-10-CM | POA: Diagnosis not present

## 2022-03-11 LAB — GLUCOSE, CAPILLARY
Glucose-Capillary: 127 mg/dL — ABNORMAL HIGH (ref 70–99)
Glucose-Capillary: 104 mg/dL — ABNORMAL HIGH (ref 70–99)
Glucose-Capillary: 123 mg/dL — ABNORMAL HIGH (ref 70–99)
Glucose-Capillary: 131 mg/dL — ABNORMAL HIGH (ref 70–99)

## 2022-03-11 NOTE — ED Notes (Signed)
Pt sleeping at present, no distress noted.  Monitoring for safety. 

## 2022-03-11 NOTE — ED Provider Notes (Signed)
Behavioral Health Progress Note  Date and Time: 03/11/2022 8:23 AM Name: Teresa Murillo MRN:  [id number]  Subjective:    Teresa Murillo is a 62 y/o female with PMH of schizophrenia, borderline intellectual functioning, tobacco use d/o, HTN, HLD, DM2 (insulin-dependent), atrial flutter, COPD, admitted to [facility] on 11/23/21 voluntarily, accompanied by [employer] and reported that she was locked out of her group home. It was later confirmed that Teresa Murillo was dismissed from group home due to recurrent history of eloping from the group home. Teresa Murillo has been boarding at this facility until placement is secured.  "Doing pretty good"  Teresa Murillo is sleeping on my approach, awakens to name being called. She endorses "doing pretty good". She denies mood disturbances, including depression, anxiety, euphoria. She denies suicidal, homicidal or violent ideations. She denies auditory visual hallucinations or paranoia. She has remained cooperative and pleasant with staff and peers. Objectively, there is no evidence of agitation, aggression, distractibility or internal preoccupation. No delusions or paranoia elicited. She is calm, cooperative, pleasant. She remains psychiatrically cleared. Pending discharge on 03/15/22 to [facility].  Diagnosis:  Final diagnoses:  Schizophrenia, unspecified type (HCC)    Total Time spent with patient: 15 minutes  Past Psychiatric History: see HPI Past Medical History:  Past Medical History:  Diagnosis Date   Borderline intellectual functioning 12/14/2021   On 12/14/2021: Appreciate assistance from psychology consult. On the Wechsler Adult Intelligence Scale-4, Ms. Gonzalez achieved a full-scale IQ score of 73 and a percentile rank of 4 placing her in the borderline range of intellectual functioning.    Chronic obstructive pulmonary disease (COPD) (HCC)    Glaucoma    Hyperlipidemia    Hypertension    Iron deficiency    Schizoaffective disorder (HCC)    Type 2  diabetes mellitus (HCC)     Past Surgical History:  Procedure Laterality Date   TUBAL LIGATION     Family History:  Family History  Problem Relation Age of Onset   Breast cancer Maternal Grandmother    Family Psychiatric  History: None reported Social History:  Social History   Substance and Sexual Activity  Alcohol Use Not Currently     Social History   Substance and Sexual Activity  Drug Use Not Currently    Social History   Socioeconomic History   Marital status: Divorced    Spouse name: Not on file   Number of children: Not on file   Years of education: Not on file   Highest education level: Not on file  Occupational History   Not on file  Tobacco Use   Smoking status: Former    Packs/day: 1.00    Types: Cigars, Cigarettes    Quit date: 11/09/2021    Years since quitting: 0.3   Smokeless tobacco: Current  Vaping Use   Vaping Use: Never used  Substance and Sexual Activity   Alcohol use: Not Currently   Drug use: Not Currently   Sexual activity: Not Currently    Birth control/protection: Surgical  Other Topics Concern   Not on file  Social History Narrative   Not on file   Social Determinants of Health   Financial Resource Strain: Not on file  Food Insecurity: Not on file  Transportation Needs: Not on file  Physical Activity: Not on file  Stress: Not on file  Social Connections: Not on file   SDOH:  SDOH Screenings   Depression (PHQ2-9): Low Risk  (11/23/2021)  Tobacco Use: High Risk (03/08/2022)   Additional  Social History:                         Sleep: Good  Appetite:  Good  Current Medications:  Current Facility-Administered Medications  Medication Dose Route Frequency Provider Last Rate Last Admin   acetaminophen (TYLENOL) tablet 650 mg  650 mg Oral Q6H PRN Sindy GuadeloupeWilliams, Roy, NP   650 mg at 03/03/22 2130   albuterol (VENTOLIN HFA) 108 (90 Base) MCG/ACT inhaler 2 puff  2 puff Inhalation Q6H PRN Sindy GuadeloupeWilliams, Roy, NP   2 puff at  02/20/22 1944   alum & mag hydroxide-simeth (MAALOX/MYLANTA) 200-200-20 MG/5ML suspension 30 mL  30 mL Oral Q4H PRN Sindy GuadeloupeWilliams, Roy, NP   30 mL at 03/07/22 2155   apixaban (ELIQUIS) tablet 5 mg  5 mg Oral BID Sindy GuadeloupeWilliams, Roy, NP   5 mg at 03/10/22 2110   ARIPiprazole (ABILIFY) tablet 10 mg  10 mg Oral Daily Sindy GuadeloupeWilliams, Roy, NP   10 mg at 03/10/22 0909   cloZAPine (CLOZARIL) tablet 75 mg  75 mg Oral BID Park PopeJi, Andrew, MD   75 mg at 03/10/22 2110   colchicine tablet 0.6 mg  0.6 mg Oral QHS Sindy GuadeloupeWilliams, Roy, NP   0.6 mg at 03/10/22 2110   diltiazem (CARDIZEM CD) 24 hr capsule 240 mg  240 mg Oral Daily Sindy GuadeloupeWilliams, Roy, NP   240 mg at 03/10/22 0910   divalproex (DEPAKOTE ER) 24 hr tablet 500 mg  500 mg Oral BID Sindy GuadeloupeWilliams, Roy, NP   500 mg at 03/10/22 2110   haloperidol (HALDOL) tablet 10 mg  10 mg Oral TID PRN Sindy GuadeloupeWilliams, Roy, NP       insulin aspart (novoLOG) injection 0-9 Units  0-9 Units Subcutaneous TID WC Park PopeJi, Andrew, MD   1 Units at 03/11/22 0805   insulin glargine-yfgn (SEMGLEE) injection 12 Units  12 Units Subcutaneous Daily Sindy GuadeloupeWilliams, Roy, NP   12 Units at 03/10/22 0912   latanoprost (XALATAN) 0.005 % ophthalmic solution 1 drop  1 drop Both Eyes QHS Sindy GuadeloupeWilliams, Roy, NP   1 drop at 03/10/22 2111   magnesium hydroxide (MILK OF MAGNESIA) suspension 30 mL  30 mL Oral Daily PRN Sindy GuadeloupeWilliams, Roy, NP       metFORMIN (GLUCOPHAGE) tablet 500 mg  500 mg Oral BID WC Sindy GuadeloupeWilliams, Roy, NP   500 mg at 03/11/22 0805   mometasone-formoterol (DULERA) 200-5 MCG/ACT inhaler 2 puff  2 puff Inhalation BID Sindy GuadeloupeWilliams, Roy, NP   2 puff at 03/10/22 1954   multivitamins with iron tablet 1 tablet  1 tablet Oral QHS Princess BruinsNguyen, Julie, DO   1 tablet at 03/10/22 2110   nicotine (NICODERM CQ - dosed in mg/24 hr) patch 7 mg  7 mg Transdermal Daily Sindy GuadeloupeWilliams, Roy, NP   7 mg at 03/10/22 0911   rosuvastatin (CRESTOR) tablet 20 mg  20 mg Oral QHS Princess BruinsNguyen, Julie, DO   20 mg at 03/10/22 2111   valbenazine (INGREZZA) capsule 40 mg  40 mg Oral q AM Sindy GuadeloupeWilliams, Roy, NP    40 mg at 03/11/22 0604   Vitamin D (Ergocalciferol) (DRISDOL) 1.25 MG (50000 UNIT) capsule 50,000 Units  50,000 Units Oral Q Crista Curbhu Williams, Roy, NP   50,000 Units at 03/04/22 [id number]   Current Outpatient Medications  Medication Sig Dispense Refill   Accu-Chek Softclix Lancets lancets Use as directed up to four times daily 100 each 0   apixaban (ELIQUIS) 5 MG TABS tablet Take 1 tablet (5 mg total) by mouth 2 (two)  times daily. 60 tablet 0   ARIPiprazole (ABILIFY) 10 MG tablet Take 1 tablet (10 mg total) by mouth daily. 30 tablet 0   Blood Glucose Monitoring Suppl (ACCU-CHEK GUIDE) w/Device KIT Use as directed up to four times daily 1 kit 0   budesonide-formoterol (SYMBICORT) 160-4.5 MCG/ACT inhaler Inhale 2 puffs into the lungs in the morning and at bedtime.     Cholecalciferol (VITAMIN D3) 1.25 MG (50000 UT) CAPS Take 50,000 Units by mouth every Thursday.     clozapine (CLOZARIL) 50 MG tablet Take 1 tablet (50 mg total) by mouth daily. 30 tablet 0   colchicine 0.6 MG tablet Take 1 tablet (0.6 mg total) by mouth at bedtime. 30 tablet 0   diltiazem (CARDIZEM CD) 240 MG 24 hr capsule Take 1 capsule (240 mg total) by mouth daily. (Patient not taking: Reported on 11/24/2021) 30 capsule 0   divalproex (DEPAKOTE ER) 500 MG 24 hr tablet Take 1 tablet (500 mg total) by mouth 2 (two) times daily. 60 tablet 0   glucose blood test strip Use as directed up to four times daily 50 each 0   haloperidol (HALDOL) 10 MG tablet Take 1 tablet (10 mg total) by mouth 3 (three) times daily as needed for agitation (and psychotic symptoms).     INGREZZA 40 MG capsule Take 1 capsule (40 mg total) by mouth in the morning. 30 capsule 0   insulin aspart (NOVOLOG) 100 UNIT/ML FlexPen Before each meal 3 times a day, 140-199 - 2 units, 200-250 - 4 units, 251-299 - 6 units,  300-349 - 8 units,  350 or above 10 units. Insulin PEN if approved, provide syringes and needles if needed.Please switch to any approved short acting Insulin if  needed. 15 mL 0   insulin glargine (LANTUS) 100 UNIT/ML Solostar Pen Inject 12 Units into the skin daily. 15 mL 0   Insulin Pen Needle 32G X 4 MM MISC Use 4 times a day with insulin, 1 month supply. 100 each 0   latanoprost (XALATAN) 0.005 % ophthalmic solution Place 1 drop into both eyes at bedtime.     metFORMIN (GLUCOPHAGE) 500 MG tablet Take 500 mg by mouth 2 (two) times daily with a meal.     nicotine (NICODERM CQ - DOSED IN MG/24 HR) 7 mg/24hr patch Place 1 patch (7 mg total) onto the skin daily. 28 patch 0   PROAIR HFA 108 (90 Base) MCG/ACT inhaler Inhale 2 puffs into the lungs every 6 (six) hours as needed for wheezing or shortness of breath.      Labs  Lab Results:  Admission on 02/18/2022  Component Date Value Ref Range Status   Glucose-Capillary 02/18/2022 254 (H)  70 - 99 mg/dL Final   Glucose reference range applies only to samples taken after fasting for at least 8 hours.   Glucose-Capillary 02/18/2022 112 (H)  70 - 99 mg/dL Final   Glucose reference range applies only to samples taken after fasting for at least 8 hours.   Glucose-Capillary 02/19/2022 159 (H)  70 - 99 mg/dL Final   Glucose reference range applies only to samples taken after fasting for at least 8 hours.   Glucose-Capillary 02/19/2022 160 (H)  70 - 99 mg/dL Final   Glucose reference range applies only to samples taken after fasting for at least 8 hours.   Glucose-Capillary 02/19/2022 177 (H)  70 - 99 mg/dL Final   Glucose reference range applies only to samples taken after fasting for at least 8  hours.   Glucose-Capillary 02/19/2022 147 (H)  70 - 99 mg/dL Final   Glucose reference range applies only to samples taken after fasting for at least 8 hours.   Glucose-Capillary 02/20/2022 126 (H)  70 - 99 mg/dL Final   Glucose reference range applies only to samples taken after fasting for at least 8 hours.   Glucose-Capillary 02/20/2022 132 (H)  70 - 99 mg/dL Final   Glucose reference range applies only to samples  taken after fasting for at least 8 hours.   Glucose-Capillary 02/20/2022 125 (H)  70 - 99 mg/dL Final   Glucose reference range applies only to samples taken after fasting for at least 8 hours.   Glucose-Capillary 02/21/2022 151 (H)  70 - 99 mg/dL Final   Glucose reference range applies only to samples taken after fasting for at least 8 hours.   Glucose-Capillary 02/21/2022 106 (H)  70 - 99 mg/dL Final   Glucose reference range applies only to samples taken after fasting for at least 8 hours.   Glucose-Capillary 02/21/2022 149 (H)  70 - 99 mg/dL Final   Glucose reference range applies only to samples taken after fasting for at least 8 hours.   Glucose-Capillary 02/22/2022 131 (H)  70 - 99 mg/dL Final   Glucose reference range applies only to samples taken after fasting for at least 8 hours.   Glucose-Capillary 02/22/2022 141 (H)  70 - 99 mg/dL Final   Glucose reference range applies only to samples taken after fasting for at least 8 hours.   Glucose-Capillary 02/22/2022 183 (H)  70 - 99 mg/dL Final   Glucose reference range applies only to samples taken after fasting for at least 8 hours.   Glucose-Capillary 02/23/2022 133 (H)  70 - 99 mg/dL Final   Glucose reference range applies only to samples taken after fasting for at least 8 hours.   WBC 02/23/2022 4.8  4.0 - 10.5 K/uL Final   RBC 02/23/2022 3.96  3.87 - 5.11 MIL/uL Final   Hemoglobin 02/23/2022 9.7 (L)  12.0 - 15.0 g/dL Final   HCT 16/10/960412/26/2023 30.6 (L)  36.0 - 46.0 % Final   MCV 02/23/2022 77.3 (L)  80.0 - 100.0 fL Final   MCH 02/23/2022 24.5 (L)  26.0 - 34.0 pg Final   MCHC 02/23/2022 31.7  30.0 - 36.0 g/dL Final   RDW 54/09/811912/26/2023 16.5 (H)  11.5 - 15.5 % Final   Platelets 02/23/2022 372  150 - 400 K/uL Final   nRBC 02/23/2022 0.0  0.0 - 0.2 % Final   Neutrophils Relative % 02/23/2022 56  % Final   Neutro Abs 02/23/2022 2.7  1.7 - 7.7 K/uL Final   Lymphocytes Relative 02/23/2022 32  % Final   Lymphs Abs 02/23/2022 1.5  0.7 - 4.0  K/uL Final   Monocytes Relative 02/23/2022 9  % Final   Monocytes Absolute 02/23/2022 0.4  0.1 - 1.0 K/uL Final   Eosinophils Relative 02/23/2022 2  % Final   Eosinophils Absolute 02/23/2022 0.1  0.0 - 0.5 K/uL Final   Basophils Relative 02/23/2022 1  % Final   Basophils Absolute 02/23/2022 0.1  0.0 - 0.1 K/uL Final   Immature Granulocytes 02/23/2022 0  % Final   Abs Immature Granulocytes 02/23/2022 0.01  0.00 - 0.07 K/uL Final   Performed at [facility], [address] [place], [place] [id number]   Glucose-Capillary 02/23/2022 202 (H)  70 - 99 mg/dL Final   Glucose reference range applies only to  samples taken after fasting for at least 8 hours.   Glucose-Capillary 02/23/2022 143 (H)  70 - 99 mg/dL Final   Glucose reference range applies only to samples taken after fasting for at least 8 hours.   Glucose-Capillary 02/23/2022 143 (H)  70 - 99 mg/dL Final   Glucose reference range applies only to samples taken after fasting for at least 8 hours.   Glucose-Capillary 02/24/2022 207 (H)  70 - 99 mg/dL Final   Glucose reference range applies only to samples taken after fasting for at least 8 hours.   Glucose-Capillary 02/24/2022 109 (H)  70 - 99 mg/dL Final   Glucose reference range applies only to samples taken after fasting for at least 8 hours.   Glucose-Capillary 02/24/2022 127 (H)  70 - 99 mg/dL Final   Glucose reference range applies only to samples taken after fasting for at least 8 hours.   Glucose-Capillary 02/24/2022 130 (H)  70 - 99 mg/dL Final   Glucose reference range applies only to samples taken after fasting for at least 8 hours.   Glucose-Capillary 02/25/2022 117 (H)  70 - 99 mg/dL Final   Glucose reference range applies only to samples taken after fasting for at least 8 hours.   Glucose-Capillary 02/25/2022 104 (H)  70 - 99 mg/dL Final   Glucose reference range applies only to samples taken after fasting for at least 8 hours.   Glucose-Capillary 02/25/2022 180 (H)  70  - 99 mg/dL Final   Glucose reference range applies only to samples taken after fasting for at least 8 hours.   Glucose-Capillary 02/25/2022 180 (H)  70 - 99 mg/dL Final   Glucose reference range applies only to samples taken after fasting for at least 8 hours.   Glucose-Capillary 02/26/2022 133 (H)  70 - 99 mg/dL Final   Glucose reference range applies only to samples taken after fasting for at least 8 hours.   Glucose-Capillary 02/26/2022 85  70 - 99 mg/dL Final   Glucose reference range applies only to samples taken after fasting for at least 8 hours.   Glucose-Capillary 02/26/2022 136 (H)  70 - 99 mg/dL Final   Glucose reference range applies only to samples taken after fasting for at least 8 hours.   Glucose-Capillary 02/26/2022 170 (H)  70 - 99 mg/dL Final   Glucose reference range applies only to samples taken after fasting for at least 8 hours.   Glucose-Capillary 02/26/2022 185 (H)  70 - 99 mg/dL Final   Glucose reference range applies only to samples taken after fasting for at least 8 hours.   Glucose-Capillary 02/27/2022 124 (H)  70 - 99 mg/dL Final   Glucose reference range applies only to samples taken after fasting for at least 8 hours.   Glucose-Capillary 02/27/2022 161 (H)  70 - 99 mg/dL Final   Glucose reference range applies only to samples taken after fasting for at least 8 hours.   Glucose-Capillary 02/27/2022 170 (H)  70 - 99 mg/dL Final   Glucose reference range applies only to samples taken after fasting for at least 8 hours.   Glucose-Capillary 02/27/2022 154 (H)  70 - 99 mg/dL Final   Glucose reference range applies only to samples taken after fasting for at least 8 hours.   Glucose-Capillary 02/28/2022 109 (H)  70 - 99 mg/dL Final   Glucose reference range applies only to samples taken after fasting for at least 8 hours.   Glucose-Capillary 02/28/2022 134 (H)  70 - 99 mg/dL  Final   Glucose reference range applies only to samples taken after fasting for at least 8  hours.   Glucose-Capillary 02/28/2022 160 (H)  70 - 99 mg/dL Final   Glucose reference range applies only to samples taken after fasting for at least 8 hours.   Glucose-Capillary 02/28/2022 136 (H)  70 - 99 mg/dL Final   Glucose reference range applies only to samples taken after fasting for at least 8 hours.   Glucose-Capillary 02/28/2022 173 (H)  70 - 99 mg/dL Final   Glucose reference range applies only to samples taken after fasting for at least 8 hours.   Glucose-Capillary 03/01/2022 134 (H)  70 - 99 mg/dL Final   Glucose reference range applies only to samples taken after fasting for at least 8 hours.   Glucose-Capillary 03/01/2022 116 (H)  70 - 99 mg/dL Final   Glucose reference range applies only to samples taken after fasting for at least 8 hours.   Glucose-Capillary 03/01/2022 227 (H)  70 - 99 mg/dL Final   Glucose reference range applies only to samples taken after fasting for at least 8 hours.   Glucose-Capillary 03/01/2022 157 (H)  70 - 99 mg/dL Final   Glucose reference range applies only to samples taken after fasting for at least 8 hours.   Glucose-Capillary 03/02/2022 99  70 - 99 mg/dL Final   Glucose reference range applies only to samples taken after fasting for at least 8 hours.   WBC 03/02/2022 6.0  4.0 - 10.5 K/uL Final   RBC 03/02/2022 4.15  3.87 - 5.11 MIL/uL Final   Hemoglobin 03/02/2022 9.7 (L)  12.0 - 15.0 g/dL Final   HCT 16/11/9602 31.5 (L)  36.0 - 46.0 % Final   MCV 03/02/2022 75.9 (L)  80.0 - 100.0 fL Final   MCH 03/02/2022 23.4 (L)  26.0 - 34.0 pg Final   MCHC 03/02/2022 30.8  30.0 - 36.0 g/dL Final   RDW 54/10/8117 16.4 (H)  11.5 - 15.5 % Final   Platelets 03/02/2022 395  150 - 400 K/uL Final   nRBC 03/02/2022 0.0  0.0 - 0.2 % Final   Neutrophils Relative % 03/02/2022 61  % Final   Neutro Abs 03/02/2022 3.6  1.7 - 7.7 K/uL Final   Lymphocytes Relative 03/02/2022 29  % Final   Lymphs Abs 03/02/2022 1.7  0.7 - 4.0 K/uL Final   Monocytes Relative  03/02/2022 8  % Final   Monocytes Absolute 03/02/2022 0.5  0.1 - 1.0 K/uL Final   Eosinophils Relative 03/02/2022 1  % Final   Eosinophils Absolute 03/02/2022 0.1  0.0 - 0.5 K/uL Final   Basophils Relative 03/02/2022 1  % Final   Basophils Absolute 03/02/2022 0.1  0.0 - 0.1 K/uL Final   Immature Granulocytes 03/02/2022 0  % Final   Abs Immature Granulocytes 03/02/2022 0.01  0.00 - 0.07 K/uL Final   Performed at [facility], [address] [place], [place] [address]   Glucose-Capillary 03/02/2022 171 (H)  70 - 99 mg/dL Final   Glucose reference range applies only to samples taken after fasting for at least 8 hours.   Glucose-Capillary 03/02/2022 108 (H)  70 - 99 mg/dL Final   Glucose reference range applies only to samples taken after fasting for at least 8 hours.   Glucose-Capillary 03/02/2022 203 (H)  70 - 99 mg/dL Final   Glucose reference range applies only to samples taken after fasting for at least 8 hours.   Glucose-Capillary 03/03/2022  128 (H)  70 - 99 mg/dL Final   Glucose reference range applies only to samples taken after fasting for at least 8 hours.   Glucose-Capillary 03/03/2022 134 (H)  70 - 99 mg/dL Final   Glucose reference range applies only to samples taken after fasting for at least 8 hours.   Glucose-Capillary 03/03/2022 100 (H)  70 - 99 mg/dL Final   Glucose reference range applies only to samples taken after fasting for at least 8 hours.   Glucose-Capillary 03/03/2022 160 (H)  70 - 99 mg/dL Final   Glucose reference range applies only to samples taken after fasting for at least 8 hours.   Glucose-Capillary 03/04/2022 116 (H)  70 - 99 mg/dL Final   Glucose reference range applies only to samples taken after fasting for at least 8 hours.   Glucose-Capillary 03/04/2022 104 (H)  70 - 99 mg/dL Final   Glucose reference range applies only to samples taken after fasting for at least 8 hours.   Glucose-Capillary 03/04/2022 156 (H)  70 - 99 mg/dL Final   Glucose  reference range applies only to samples taken after fasting for at least 8 hours.   Glucose-Capillary 03/04/2022 201 (H)  70 - 99 mg/dL Final   Glucose reference range applies only to samples taken after fasting for at least 8 hours.   Glucose-Capillary 03/05/2022 120 (H)  70 - 99 mg/dL Final   Glucose reference range applies only to samples taken after fasting for at least 8 hours.   Glucose-Capillary 03/05/2022 237 (H)  70 - 99 mg/dL Final   Glucose reference range applies only to samples taken after fasting for at least 8 hours.   Glucose-Capillary 03/05/2022 168 (H)  70 - 99 mg/dL Final   Glucose reference range applies only to samples taken after fasting for at least 8 hours.   Glucose-Capillary 03/05/2022 200 (H)  70 - 99 mg/dL Final   Glucose reference range applies only to samples taken after fasting for at least 8 hours.   Glucose-Capillary 03/06/2022 135 (H)  70 - 99 mg/dL Final   Glucose reference range applies only to samples taken after fasting for at least 8 hours.   Glucose-Capillary 03/06/2022 135 (H)  70 - 99 mg/dL Final   Glucose reference range applies only to samples taken after fasting for at least 8 hours.   Glucose-Capillary 03/06/2022 235 (H)  70 - 99 mg/dL Final   Glucose reference range applies only to samples taken after fasting for at least 8 hours.   Glucose-Capillary 03/07/2022 141 (H)  70 - 99 mg/dL Final   Glucose reference range applies only to samples taken after fasting for at least 8 hours.   Glucose-Capillary 03/07/2022 157 (H)  70 - 99 mg/dL Final   Glucose reference range applies only to samples taken after fasting for at least 8 hours.   Glucose-Capillary 03/07/2022 199 (H)  70 - 99 mg/dL Final   Glucose reference range applies only to samples taken after fasting for at least 8 hours.   Glucose-Capillary 03/07/2022 241 (H)  70 - 99 mg/dL Final   Glucose reference range applies only to samples taken after fasting for at least 8 hours.    Glucose-Capillary 03/08/2022 123 (H)  70 - 99 mg/dL Final   Glucose reference range applies only to samples taken after fasting for at least 8 hours.   Glucose-Capillary 03/06/2022 155 (H)  70 - 99 mg/dL Final   Glucose reference range applies only to samples taken  after fasting for at least 8 hours.   Glucose-Capillary 03/08/2022 197 (H)  70 - 99 mg/dL Final   Glucose reference range applies only to samples taken after fasting for at least 8 hours.   Glucose-Capillary 03/08/2022 158 (H)  70 - 99 mg/dL Final   Glucose reference range applies only to samples taken after fasting for at least 8 hours.   Glucose-Capillary 03/09/2022 118 (H)  70 - 99 mg/dL Final   Glucose reference range applies only to samples taken after fasting for at least 8 hours.   Glucose-Capillary 03/09/2022 142 (H)  70 - 99 mg/dL Final   Glucose reference range applies only to samples taken after fasting for at least 8 hours.   Glucose-Capillary 03/09/2022 243 (H)  70 - 99 mg/dL Final   Glucose reference range applies only to samples taken after fasting for at least 8 hours.   Glucose-Capillary 03/09/2022 171 (H)  70 - 99 mg/dL Final   Glucose reference range applies only to samples taken after fasting for at least 8 hours.   Glucose-Capillary 03/10/2022 133 (H)  70 - 99 mg/dL Final   Glucose reference range applies only to samples taken after fasting for at least 8 hours.   WBC 03/10/2022 7.8  4.0 - 10.5 K/uL Final   RBC 03/10/2022 4.58  3.87 - 5.11 MIL/uL Final   Hemoglobin 03/10/2022 10.7 (L)  12.0 - 15.0 g/dL Final   HCT 11/91/4782 34.7 (L)  36.0 - 46.0 % Final   MCV 03/10/2022 75.8 (L)  80.0 - 100.0 fL Final   MCH 03/10/2022 23.4 (L)  26.0 - 34.0 pg Final   MCHC 03/10/2022 30.8  30.0 - 36.0 g/dL Final   RDW 95/62/1308 17.0 (H)  11.5 - 15.5 % Final   Platelets 03/10/2022 347  150 - 400 K/uL Final   nRBC 03/10/2022 0.0  0.0 - 0.2 % Final   Neutrophils Relative % 03/10/2022 62  % Final   Neutro Abs 03/10/2022 4.8   1.7 - 7.7 K/uL Final   Lymphocytes Relative 03/10/2022 26  % Final   Lymphs Abs 03/10/2022 2.1  0.7 - 4.0 K/uL Final   Monocytes Relative 03/10/2022 10  % Final   Monocytes Absolute 03/10/2022 0.7  0.1 - 1.0 K/uL Final   Eosinophils Relative 03/10/2022 1  % Final   Eosinophils Absolute 03/10/2022 0.1  0.0 - 0.5 K/uL Final   Basophils Relative 03/10/2022 1  % Final   Basophils Absolute 03/10/2022 0.1  0.0 - 0.1 K/uL Final   Immature Granulocytes 03/10/2022 0  % Final   Abs Immature Granulocytes 03/10/2022 0.03  0.00 - 0.07 K/uL Final   Performed at [facility], [address], [place], [place] [address]   Glucose-Capillary 03/10/2022 107 (H)  70 - 99 mg/dL Final   Glucose reference range applies only to samples taken after fasting for at least 8 hours.   Glucose-Capillary 03/10/2022 225 (H)  70 - 99 mg/dL Final   Glucose reference range applies only to samples taken after fasting for at least 8 hours.   Glucose-Capillary 03/10/2022 155 (H)  70 - 99 mg/dL Final   Glucose reference range applies only to samples taken after fasting for at least 8 hours.   Glucose-Capillary 03/11/2022 127 (H)  70 - 99 mg/dL Final   Glucose reference range applies only to samples taken after fasting for at least 8 hours.  Admission on 02/17/2022, Discharged on 02/18/2022  Component Date Value Ref Range Status  Sodium 02/17/2022 136  135 - 145 mmol/L Final   Potassium 02/17/2022 3.9  3.5 - 5.1 mmol/L Final   Chloride 02/17/2022 101  98 - 111 mmol/L Final   CO2 02/17/2022 25  22 - 32 mmol/L Final   Glucose, Bld 02/17/2022 113 (H)  70 - 99 mg/dL Final   Glucose reference range applies only to samples taken after fasting for at least 8 hours.   BUN 02/17/2022 9  6 - 20 mg/dL Final   Creatinine, Ser 02/17/2022 0.50  0.44 - 1.00 mg/dL Final   Calcium 03/09/3233 8.9  8.9 - 10.3 mg/dL Final   GFR, Estimated 02/17/2022 >60  >60 mL/min Final   Comment: (NOTE) Calculated using the CKD-EPI Creatinine Equation  (2021)    Anion gap 02/17/2022 10  5 - 15 Final   Performed at [facility], [address], [place], [place] [address]   WBC 02/17/2022 6.5  4.0 - 10.5 K/uL Final   RBC 02/17/2022 4.24  3.87 - 5.11 MIL/uL Final   Hemoglobin 02/17/2022 10.2 (L)  12.0 - 15.0 g/dL Final   HCT 02/54/2706 33.2 (L)  36.0 - 46.0 % Final   MCV 02/17/2022 78.3 (L)  80.0 - 100.0 fL Final   MCH 02/17/2022 24.1 (L)  26.0 - 34.0 pg Final   MCHC 02/17/2022 30.7  30.0 - 36.0 g/dL Final   RDW 23/76/2831 17.2 (H)  11.5 - 15.5 % Final   Platelets 02/17/2022 390  150 - 400 K/uL Final   nRBC 02/17/2022 0.0  0.0 - 0.2 % Final   Performed at [facility], [address] [place], [place] [address]   Troponin I (High Sensitivity) 02/17/2022 4  <18 ng/L Final   Comment: (NOTE) Elevated high sensitivity troponin I (hsTnI) values and significant  changes across serial measurements may suggest ACS but many other  chronic and acute conditions are known to elevate hsTnI results.  Refer to the "Links" section for chest pain algorithms and additional  guidance. Performed at [facility], [address] [place], [place] [address]    Valproic Acid Lvl 02/17/2022 17 (L)  50.0 - 100.0 ug/mL Final   Performed at [facility], [address], [place], [place] [address]   B Natriuretic Peptide 02/17/2022 30.9  0.0 - 100.0 pg/mL Final   Performed at [facility], [address], [place], [place] [address]   Troponin I (High Sensitivity) 02/18/2022 4  <18 ng/L Final   Comment: (NOTE) Elevated high sensitivity troponin I (hsTnI) values and significant  changes across serial measurements may suggest ACS but many other  chronic and acute conditions are known to elevate hsTnI results.  Refer to the "Links" section for chest pain algorithms and additional  guidance. Performed at [facility], [address] [place], [place] [address]    D-Dimer, Quant 02/18/2022 0.29  0.00 - 0.50 ug/mL-FEU Final    Comment: (NOTE) At the manufacturer cut-off value of 0.5 g/mL FEU, this assay has a negative predictive value of 95-100%.This assay is intended for use in conjunction with a clinical pretest probability (PTP) assessment model to exclude pulmonary embolism (PE) and deep venous thrombosis (DVT) in outpatients suspected of PE or DVT. Results should be correlated with clinical presentation. Performed at [facility], [address], [place], [place] [address]   Admission on 02/09/2022, Discharged on 02/17/2022  Component Date Value Ref Range Status   Glucose-Capillary 02/09/2022 118 (H)  70 - 99 mg/dL Final   Glucose reference range applies only to samples taken after fasting for at least 8 hours.   Glucose-Capillary 02/10/2022 148 (H)  70 - 99 mg/dL Final   Glucose reference range applies only to samples taken after fasting for at least 8 hours.   Glucose-Capillary 02/10/2022 174 (H)  70 - 99 mg/dL Final   Glucose reference range applies only to samples taken after fasting for at least 8 hours.   Glucose-Capillary 02/10/2022 128 (H)  70 - 99 mg/dL Final   Glucose reference range applies only to samples taken after fasting for at least 8 hours.   Glucose-Capillary 02/10/2022 182 (H)  70 - 99 mg/dL Final   Glucose reference range applies only to samples taken after fasting for at least 8 hours.   Glucose-Capillary 02/11/2022 158 (H)  70 - 99 mg/dL Final   Glucose reference range applies only to samples taken after fasting for at least 8 hours.   Glucose-Capillary 02/11/2022 159 (H)  70 - 99 mg/dL Final   Glucose reference range applies only to samples taken after fasting for at least 8 hours.   Glucose-Capillary 02/11/2022 153 (H)  70 - 99 mg/dL Final   Glucose reference range applies only to samples taken after fasting for at least 8 hours.   Glucose-Capillary 02/11/2022 159 (H)  70 - 99 mg/dL Final   Glucose reference range applies only to samples taken after fasting for at least 8  hours.   Glucose-Capillary 02/11/2022 153 (H)  70 - 99 mg/dL Final   Glucose reference range applies only to samples taken after fasting for at least 8 hours.   Glucose-Capillary 02/11/2022 109 (H)  70 - 99 mg/dL Final   Glucose reference range applies only to samples taken after fasting for at least 8 hours.   Glucose-Capillary 02/11/2022 213 (H)  70 - 99 mg/dL Final   Glucose reference range applies only to samples taken after fasting for at least 8 hours.   Glucose-Capillary 02/11/2022 229 (H)  70 - 99 mg/dL Final   Glucose reference range applies only to samples taken after fasting for at least 8 hours.   Glucose-Capillary 02/12/2022 128 (H)  70 - 99 mg/dL Final   Glucose reference range applies only to samples taken after fasting for at least 8 hours.   Glucose-Capillary 02/12/2022 149 (H)  70 - 99 mg/dL Final   Glucose reference range applies only to samples taken after fasting for at least 8 hours.   Color, Urine 02/12/2022 YELLOW  YELLOW Final   APPearance 02/12/2022 CLEAR  CLEAR Final   Specific Gravity, Urine 02/12/2022 1.015  1.005 - 1.030 Final   pH 02/12/2022 5.0  5.0 - 8.0 Final   Glucose, UA 02/12/2022 NEGATIVE  NEGATIVE mg/dL Final   Hgb urine dipstick 02/12/2022 NEGATIVE  NEGATIVE Final   Bilirubin Urine 02/12/2022 NEGATIVE  NEGATIVE Final   Ketones, ur 02/12/2022 NEGATIVE  NEGATIVE mg/dL Final   Protein, ur 33/00/7622 NEGATIVE  NEGATIVE mg/dL Final   Nitrite 63/33/5456 NEGATIVE  NEGATIVE Final   Leukocytes,Ua 02/12/2022 NEGATIVE  NEGATIVE Final   Performed at [facility], [address] [place], [place] [address]   Specimen Description 02/12/2022 URINE, CLEAN CATCH   Final   Special Requests 02/12/2022    Final                   Value:NONE Performed at [facility], [address] [place], [place] [address]  Culture 02/12/2022 10,000 COLONIES/mL PROTEUS MIRABILIS (A)   Final   Report Status 02/12/2022 02/15/2022 FINAL   Final   Organism ID, Bacteria  02/12/2022 PROTEUS MIRABILIS (A)   Final   Glucose-Capillary 02/12/2022 128 (H)  70 - 99 mg/dL Final   Glucose reference range applies only to samples taken after fasting for at least 8 hours.   Glucose-Capillary 02/12/2022 196 (H)  70 - 99 mg/dL Final   Glucose reference range applies only to samples taken after fasting for at least 8 hours.   Glucose-Capillary 02/13/2022 168 (H)  70 - 99 mg/dL Final   Glucose reference range applies only to samples taken after fasting for at least 8 hours.   Glucose-Capillary 02/13/2022 153 (H)  70 - 99 mg/dL Final   Glucose reference range applies only to samples taken after fasting for at least 8 hours.   Glucose-Capillary 02/13/2022 134 (H)  70 - 99 mg/dL Final   Glucose reference range applies only to samples taken after fasting for at least 8 hours.   Glucose-Capillary 02/13/2022 178 (H)  70 - 99 mg/dL Final   Glucose reference range applies only to samples taken after fasting for at least 8 hours.   Glucose-Capillary 02/14/2022 156 (H)  70 - 99 mg/dL Final   Glucose reference range applies only to samples taken after fasting for at least 8 hours.   Glucose-Capillary 02/14/2022 169 (H)  70 - 99 mg/dL Final   Glucose reference range applies only to samples taken after fasting for at least 8 hours.   Glucose-Capillary 02/14/2022 156 (H)  70 - 99 mg/dL Final   Glucose reference range applies only to samples taken after fasting for at least 8 hours.   Glucose-Capillary 02/14/2022 148 (H)  70 - 99 mg/dL Final   Glucose reference range applies only to samples taken after fasting for at least 8 hours.   Glucose-Capillary 02/15/2022 159 (H)  70 - 99 mg/dL Final   Glucose reference range applies only to samples taken after fasting for at least 8 hours.   Glucose-Capillary 02/15/2022 125 (H)  70 - 99 mg/dL Final   Glucose reference range applies only to samples taken after fasting for at least 8 hours.   Glucose-Capillary 02/15/2022 142 (H)  70 - 99 mg/dL Final    Glucose reference range applies only to samples taken after fasting for at least 8 hours.   Glucose-Capillary 02/15/2022 128 (H)  70 - 99 mg/dL Final   Glucose reference range applies only to samples taken after fasting for at least 8 hours.   Glucose-Capillary 02/16/2022 137 (H)  70 - 99 mg/dL Final   Glucose reference range applies only to samples taken after fasting for at least 8 hours.   WBC 02/16/2022 6.0  4.0 - 10.5 K/uL Final   RBC 02/16/2022 4.48  3.87 - 5.11 MIL/uL Final   Hemoglobin 02/16/2022 10.7 (L)  12.0 - 15.0 g/dL Final   HCT 12/26/2534 35.4 (L)  36.0 - 46.0 % Final   MCV 02/16/2022 79.0 (L)  80.0 - 100.0 fL Final   MCH 02/16/2022 23.9 (L)  26.0 - 34.0 pg Final   MCHC 02/16/2022 30.2  30.0 - 36.0 g/dL Final   RDW 64/40/3474 17.0 (H)  11.5 - 15.5 % Final   Platelets 02/16/2022 387  150 - 400 K/uL Final   nRBC 02/16/2022 0.0  0.0 - 0.2 % Final   Neutrophils Relative % 02/16/2022 54  % Final   Neutro Abs 02/16/2022 3.2  1.7 - 7.7 K/uL Final   Lymphocytes Relative 02/16/2022 35  % Final   Lymphs Abs 02/16/2022 2.1  0.7 - 4.0 K/uL Final   Monocytes Relative 02/16/2022 6  % Final   Monocytes Absolute 02/16/2022 0.4  0.1 - 1.0 K/uL Final   Eosinophils Relative 02/16/2022 2  % Final   Eosinophils Absolute 02/16/2022 0.1  0.0 - 0.5 K/uL Final   Basophils Relative 02/16/2022 2  % Final   Basophils Absolute 02/16/2022 0.1  0.0 - 0.1 K/uL Final   Immature Granulocytes 02/16/2022 1  % Final   Abs Immature Granulocytes 02/16/2022 0.04  0.00 - 0.07 K/uL Final   Performed at [facility], [address], [place], [place] [address]   Glucose-Capillary 02/16/2022 131 (H)  70 - 99 mg/dL Final   Glucose reference range applies only to samples taken after fasting for at least 8 hours.   Glucose-Capillary 02/16/2022 123 (H)  70 - 99 mg/dL Final   Glucose reference range applies only to samples taken after fasting for at least 8 hours.   Glucose-Capillary 02/16/2022 195 (H)  70 -  99 mg/dL Final   Glucose reference range applies only to samples taken after fasting for at least 8 hours.   Glucose-Capillary 02/17/2022 148 (H)  70 - 99 mg/dL Final   Glucose reference range applies only to samples taken after fasting for at least 8 hours.  Admission on 02/08/2022, Discharged on 02/09/2022  Component Date Value Ref Range Status   B Natriuretic Peptide 02/08/2022 40.2  0.0 - 100.0 pg/mL Final   Performed at [facility], [address] [place], [place] [address]   Troponin I (High Sensitivity) 02/08/2022 4  <18 ng/L Final   Comment: (NOTE) Elevated high sensitivity troponin I (hsTnI) values and significant  changes across serial measurements may suggest ACS but many other  chronic and acute conditions are known to elevate hsTnI results.  Refer to the "Links" section for chest pain algorithms and additional  guidance. Performed at [facility], [address] [place], [place] [address]    WBC 02/08/2022 8.5  4.0 - 10.5 K/uL Final   RBC 02/08/2022 4.30  3.87 - 5.11 MIL/uL Final   Hemoglobin 02/08/2022 10.7 (L)  12.0 - 15.0 g/dL Final   HCT 01/60/1093 33.1 (L)  36.0 - 46.0 % Final   MCV 02/08/2022 77.0 (L)  80.0 - 100.0 fL Final   MCH 02/08/2022 24.9 (L)  26.0 - 34.0 pg Final   MCHC 02/08/2022 32.3  30.0 - 36.0 g/dL Final   RDW 23/55/7322 16.5 (H)  11.5 - 15.5 % Final   Platelets 02/08/2022 361  150 - 400 K/uL Final   nRBC 02/08/2022 0.0  0.0 - 0.2 % Final   Performed at [facility], [address] [place], [place] [address]   Sodium 02/08/2022 131 (L)  135 - 145 mmol/L Final   Potassium 02/08/2022 4.4  3.5 - 5.1 mmol/L Final   Chloride 02/08/2022 96 (L)  98 - 111 mmol/L Final   CO2 02/08/2022 25  22 - 32 mmol/L Final   Glucose, Bld 02/08/2022 126 (H)  70 - 99 mg/dL Final   Glucose reference range applies only to samples taken after fasting for at least 8 hours.   BUN 02/08/2022 11  6 - 20 mg/dL Final   Creatinine, Ser 02/08/2022 0.52  0.44 -  1.00 mg/dL Final   Calcium 70/62/3762 9.4  8.9 - 10.3 mg/dL  Final   Total Protein 02/08/2022 7.5  6.5 - 8.1 g/dL Final   Albumin 65/78/4696 3.6  3.5 - 5.0 g/dL Final   AST 29/52/8413 22  15 - 41 U/L Final   ALT 02/08/2022 20  0 - 44 U/L Final   Alkaline Phosphatase 02/08/2022 67  38 - 126 U/L Final   Total Bilirubin 02/08/2022 0.1 (L)  0.3 - 1.2 mg/dL Final   GFR, Estimated 02/08/2022 >60  >60 mL/min Final   Comment: (NOTE) Calculated using the CKD-EPI Creatinine Equation (2021)    Anion gap 02/08/2022 10  5 - 15 Final   Performed at [facility], [address] [place], [place] [address]   Troponin I (High Sensitivity) 02/08/2022 5  <18 ng/L Final   Comment: (NOTE) Elevated high sensitivity troponin I (hsTnI) values and significant  changes across serial measurements may suggest ACS but many other  chronic and acute conditions are known to elevate hsTnI results.  Refer to the "Links" section for chest pain algorithms and additional  guidance. Performed at [facility], [address], [place], [place] [address]    WBC 02/09/2022 7.3  4.0 - 10.5 K/uL Final   RBC 02/09/2022 4.70  3.87 - 5.11 MIL/uL Final   Hemoglobin 02/09/2022 11.4 (L)  12.0 - 15.0 g/dL Final   HCT 36/64/4034 36.6  36.0 - 46.0 % Final   MCV 02/09/2022 77.9 (L)  80.0 - 100.0 fL Final   MCH 02/09/2022 24.3 (L)  26.0 - 34.0 pg Final   MCHC 02/09/2022 31.1  30.0 - 36.0 g/dL Final   RDW 74/25/9563 16.6 (H)  11.5 - 15.5 % Final   Platelets 02/09/2022 403 (H)  150 - 400 K/uL Final   nRBC 02/09/2022 0.0  0.0 - 0.2 % Final   Performed at [facility], [address] [place], [place] [address]   Glucose-Capillary 02/08/2022 117 (H)  70 - 99 mg/dL Final   Glucose reference range applies only to samples taken after fasting for at least 8 hours.   Sodium 02/09/2022 138  135 - 145 mmol/L Final   Potassium 02/09/2022 3.7  3.5 - 5.1 mmol/L Final   Chloride 02/09/2022 103  98 - 111 mmol/L Final   CO2  02/09/2022 24  22 - 32 mmol/L Final   Glucose, Bld 02/09/2022 134 (H)  70 - 99 mg/dL Final   Glucose reference range applies only to samples taken after fasting for at least 8 hours.   BUN 02/09/2022 9  6 - 20 mg/dL Final   Creatinine, Ser 02/09/2022 0.44  0.44 - 1.00 mg/dL Final   Calcium 33/29/5188 8.3 (L)  8.9 - 10.3 mg/dL Final   GFR, Estimated 02/09/2022 >60  >60 mL/min Final   Comment: (NOTE) Calculated using the CKD-EPI Creatinine Equation (2021)    Anion gap 02/09/2022 11  5 - 15 Final   Performed at [facility], [address] [place], [place] [address]   Iron 02/09/2022 20 (L)  28 - 170 ug/dL Final   TIBC 63/03/6008 455 (H)  250 - 450 ug/dL Final   Saturation Ratios 02/09/2022 4 (L)  10.4 - 31.8 % Final   UIBC 02/09/2022 435  ug/dL Final   Performed at [facility], [address] [place], [place] [address]   Ferritin 02/09/2022 2 (L)  11 - 307 ng/mL Final   Performed at [facility], [address], [place], [place] [address]  Weight 02/09/2022 2,846.58  oz Final   Height 02/09/2022 62  in Final   BP 02/09/2022 143/80  mmHg Final   WBC 02/09/2022 8.1  4.0 - 10.5 K/uL Final   RBC 02/09/2022 4.38  3.87 - 5.11 MIL/uL Final   Hemoglobin 02/09/2022 10.9 (L)  12.0 - 15.0 g/dL Final   HCT 29/52/8413 33.3 (L)  36.0 - 46.0 % Final   MCV 02/09/2022 76.0 (L)  80.0 - 100.0 fL Final   MCH 02/09/2022 24.9 (L)  26.0 - 34.0 pg Final   MCHC 02/09/2022 32.7  30.0 - 36.0 g/dL Final   RDW 24/40/1027 16.5 (H)  11.5 - 15.5 % Final   Platelets 02/09/2022 397  150 - 400 K/uL Final   nRBC 02/09/2022 0.0  0.0 - 0.2 % Final   Neutrophils Relative % 02/09/2022 66  % Final   Neutro Abs 02/09/2022 5.4  1.7 - 7.7 K/uL Final   Lymphocytes Relative 02/09/2022 24  % Final   Lymphs Abs 02/09/2022 1.9  0.7 - 4.0 K/uL Final   Monocytes Relative 02/09/2022 8  % Final   Monocytes Absolute 02/09/2022 0.6  0.1 - 1.0 K/uL Final   Eosinophils Relative 02/09/2022 1  % Final   Eosinophils  Absolute 02/09/2022 0.1  0.0 - 0.5 K/uL Final   Basophils Relative 02/09/2022 1  % Final   Basophils Absolute 02/09/2022 0.1  0.0 - 0.1 K/uL Final   Immature Granulocytes 02/09/2022 0  % Final   Abs Immature Granulocytes 02/09/2022 0.03  0.00 - 0.07 K/uL Final   Performed at [facility], [address], [place], [place] [address]   Glucose-Capillary 02/09/2022 238 (H)  70 - 99 mg/dL Final   Glucose reference range applies only to samples taken after fasting for at least 8 hours.   Glucose-Capillary 02/09/2022 91  70 - 99 mg/dL Final   Glucose reference range applies only to samples taken after fasting for at least 8 hours.   CRP 02/09/2022 <0.5  <1.0 mg/dL Final   Performed at [facility], [address], [place], [place] [address]   Sed Rate 02/09/2022 15  0 - 22 mm/hr Final   Performed at [facility], [address] [place], [place] [address]   Glucose-Capillary 02/09/2022 137 (H)  70 - 99 mg/dL Final   Glucose reference range applies only to samples taken after fasting for at least 8 hours.  Admission on 01/24/2022, Discharged on 02/08/2022  Component Date Value Ref Range Status   Glucose-Capillary 01/24/2022 143 (H)  70 - 99 mg/dL Final   Glucose reference range applies only to samples taken after fasting for at least 8 hours.   Glucose-Capillary 01/24/2022 120 (H)  70 - 99 mg/dL Final   Glucose reference range applies only to samples taken after fasting for at least 8 hours.   Glucose-Capillary 01/25/2022 122 (H)  70 - 99 mg/dL Final   Glucose reference range applies only to samples taken after fasting for at least 8 hours.   Glucose-Capillary 01/25/2022 190 (H)  70 - 99 mg/dL Final   Glucose reference range applies only to samples taken after fasting for at least 8 hours.   Glucose-Capillary 01/25/2022 163 (H)  70 - 99 mg/dL Final   Glucose reference range applies only to samples taken after fasting for at least 8 hours.   WBC 01/26/2022 6.5  4.0 - 10.5 K/uL  Final   RBC 01/26/2022 4.53  3.87 - 5.11 MIL/uL Final  Hemoglobin 01/26/2022 11.3 (L)  12.0 - 15.0 g/dL Final   HCT 16/11/9602 36.4  36.0 - 46.0 % Final   MCV 01/26/2022 80.4  80.0 - 100.0 fL Final   MCH 01/26/2022 24.9 (L)  26.0 - 34.0 pg Final   MCHC 01/26/2022 31.0  30.0 - 36.0 g/dL Final   RDW 54/10/8117 17.3 (H)  11.5 - 15.5 % Final   Platelets 01/26/2022 327  150 - 400 K/uL Final   nRBC 01/26/2022 0.0  0.0 - 0.2 % Final   Neutrophils Relative % 01/26/2022 60  % Final   Neutro Abs 01/26/2022 3.9  1.7 - 7.7 K/uL Final   Lymphocytes Relative 01/26/2022 31  % Final   Lymphs Abs 01/26/2022 2.0  0.7 - 4.0 K/uL Final   Monocytes Relative 01/26/2022 7  % Final   Monocytes Absolute 01/26/2022 0.5  0.1 - 1.0 K/uL Final   Eosinophils Relative 01/26/2022 1  % Final   Eosinophils Absolute 01/26/2022 0.1  0.0 - 0.5 K/uL Final   Basophils Relative 01/26/2022 1  % Final   Basophils Absolute 01/26/2022 0.1  0.0 - 0.1 K/uL Final   Immature Granulocytes 01/26/2022 0  % Final   Abs Immature Granulocytes 01/26/2022 0.02  0.00 - 0.07 K/uL Final   Performed at [facility], [address], [place], [place] [address]   Glucose-Capillary 01/26/2022 149 (H)  70 - 99 mg/dL Final   Glucose reference range applies only to samples taken after fasting for at least 8 hours.   Glucose-Capillary 01/26/2022 130 (H)  70 - 99 mg/dL Final   Glucose reference range applies only to samples taken after fasting for at least 8 hours.   Glucose-Capillary 01/26/2022 151 (H)  70 - 99 mg/dL Final   Glucose reference range applies only to samples taken after fasting for at least 8 hours.   Glucose-Capillary 01/26/2022 170 (H)  70 - 99 mg/dL Final   Glucose reference range applies only to samples taken after fasting for at least 8 hours.   Glucose-Capillary 01/27/2022 129 (H)  70 - 99 mg/dL Final   Glucose reference range applies only to samples taken after fasting for at least 8 hours.   Glucose-Capillary 01/27/2022  101 (H)  70 - 99 mg/dL Final   Glucose reference range applies only to samples taken after fasting for at least 8 hours.   Glucose-Capillary 01/27/2022 220 (H)  70 - 99 mg/dL Final   Glucose reference range applies only to samples taken after fasting for at least 8 hours.   Glucose-Capillary 01/27/2022 154 (H)  70 - 99 mg/dL Final   Glucose reference range applies only to samples taken after fasting for at least 8 hours.   Glucose-Capillary 01/27/2022 163 (H)  70 - 99 mg/dL Final   Glucose reference range applies only to samples taken after fasting for at least 8 hours.   Glucose-Capillary 01/28/2022 127 (H)  70 - 99 mg/dL Final   Glucose reference range applies only to samples taken after fasting for at least 8 hours.   Glucose-Capillary 01/28/2022 110 (H)  70 - 99 mg/dL Final   Glucose reference range applies only to samples taken after fasting for at least 8 hours.   Glucose-Capillary 01/28/2022 167 (H)  70 - 99 mg/dL Final   Glucose reference range applies only to samples taken after fasting for at least 8 hours.   Glucose-Capillary 01/29/2022 145 (H)  70 - 99 mg/dL Final   Glucose reference range applies only  to samples taken after fasting for at least 8 hours.   Glucose-Capillary 01/29/2022 203 (H)  70 - 99 mg/dL Final   Glucose reference range applies only to samples taken after fasting for at least 8 hours.   Glucose-Capillary 01/29/2022 153 (H)  70 - 99 mg/dL Final   Glucose reference range applies only to samples taken after fasting for at least 8 hours.   Glucose-Capillary 01/29/2022 166 (H)  70 - 99 mg/dL Final   Glucose reference range applies only to samples taken after fasting for at least 8 hours.   Glucose-Capillary 01/30/2022 142 (H)  70 - 99 mg/dL Final   Glucose reference range applies only to samples taken after fasting for at least 8 hours.   Glucose-Capillary 01/30/2022 138 (H)  70 - 99 mg/dL Final   Glucose reference range applies only to samples taken after fasting  for at least 8 hours.   Glucose-Capillary 01/30/2022 185 (H)  70 - 99 mg/dL Final   Glucose reference range applies only to samples taken after fasting for at least 8 hours.   Glucose-Capillary 01/30/2022 220 (H)  70 - 99 mg/dL Final   Glucose reference range applies only to samples taken after fasting for at least 8 hours.   Glucose-Capillary 01/31/2022 147 (H)  70 - 99 mg/dL Final   Glucose reference range applies only to samples taken after fasting for at least 8 hours.   Glucose-Capillary 01/31/2022 131 (H)  70 - 99 mg/dL Final   Glucose reference range applies only to samples taken after fasting for at least 8 hours.   Glucose-Capillary 01/31/2022 169 (H)  70 - 99 mg/dL Final   Glucose reference range applies only to samples taken after fasting for at least 8 hours.   Glucose-Capillary 01/31/2022 144 (H)  70 - 99 mg/dL Final   Glucose reference range applies only to samples taken after fasting for at least 8 hours.   Comment 1 01/31/2022 RN AWARE   Final   Glucose-Capillary 02/01/2022 117 (H)  70 - 99 mg/dL Final   Glucose reference range applies only to samples taken after fasting for at least 8 hours.   Glucose-Capillary 02/01/2022 180 (H)  70 - 99 mg/dL Final   Glucose reference range applies only to samples taken after fasting for at least 8 hours.   Glucose-Capillary 02/01/2022 209 (H)  70 - 99 mg/dL Final   Glucose reference range applies only to samples taken after fasting for at least 8 hours.   Glucose-Capillary 02/02/2022 131 (H)  70 - 99 mg/dL Final   Glucose reference range applies only to samples taken after fasting for at least 8 hours.   WBC 02/02/2022 7.5  4.0 - 10.5 K/uL Final   RBC 02/02/2022 4.52  3.87 - 5.11 MIL/uL Final   Hemoglobin 02/02/2022 11.3 (L)  12.0 - 15.0 g/dL Final   HCT 17/61/6073 35.0 (L)  36.0 - 46.0 % Final   MCV 02/02/2022 77.4 (L)  80.0 - 100.0 fL Final   MCH 02/02/2022 25.0 (L)  26.0 - 34.0 pg Final   MCHC 02/02/2022 32.3  30.0 - 36.0 g/dL Final    RDW 71/07/2692 16.7 (H)  11.5 - 15.5 % Final   Platelets 02/02/2022 345  150 - 400 K/uL Final   nRBC 02/02/2022 0.0  0.0 - 0.2 % Final   Neutrophils Relative % 02/02/2022 63  % Final   Neutro Abs 02/02/2022 4.7  1.7 - 7.7 K/uL Final   Lymphocytes Relative 02/02/2022 28  %  Final   Lymphs Abs 02/02/2022 2.1  0.7 - 4.0 K/uL Final   Monocytes Relative 02/02/2022 7  % Final   Monocytes Absolute 02/02/2022 0.5  0.1 - 1.0 K/uL Final   Eosinophils Relative 02/02/2022 1  % Final   Eosinophils Absolute 02/02/2022 0.1  0.0 - 0.5 K/uL Final   Basophils Relative 02/02/2022 1  % Final   Basophils Absolute 02/02/2022 0.1  0.0 - 0.1 K/uL Final   Immature Granulocytes 02/02/2022 0  % Final   Abs Immature Granulocytes 02/02/2022 0.03  0.00 - 0.07 K/uL Final   Performed at [facility], [address], [place], [place] [address]   Glucose-Capillary 02/02/2022 95  70 - 99 mg/dL Final   Glucose reference range applies only to samples taken after fasting for at least 8 hours.   Glucose-Capillary 02/02/2022 120 (H)  70 - 99 mg/dL Final   Glucose reference range applies only to samples taken after fasting for at least 8 hours.   Glucose-Capillary 02/02/2022 191 (H)  70 - 99 mg/dL Final   Glucose reference range applies only to samples taken after fasting for at least 8 hours.   Glucose-Capillary 02/03/2022 148 (H)  70 - 99 mg/dL Final   Glucose reference range applies only to samples taken after fasting for at least 8 hours.   Glucose-Capillary 02/03/2022 122 (H)  70 - 99 mg/dL Final   Glucose reference range applies only to samples taken after fasting for at least 8 hours.   Glucose-Capillary 02/03/2022 233 (H)  70 - 99 mg/dL Final   Glucose reference range applies only to samples taken after fasting for at least 8 hours.   Glucose-Capillary 02/04/2022 126 (H)  70 - 99 mg/dL Final   Glucose reference range applies only to samples taken after fasting for at least 8 hours.   Glucose-Capillary  02/04/2022 117 (H)  70 - 99 mg/dL Final   Glucose reference range applies only to samples taken after fasting for at least 8 hours.   Glucose-Capillary 02/04/2022 135 (H)  70 - 99 mg/dL Final   Glucose reference range applies only to samples taken after fasting for at least 8 hours.   Glucose-Capillary 02/04/2022 197 (H)  70 - 99 mg/dL Final   Glucose reference range applies only to samples taken after fasting for at least 8 hours.   Glucose-Capillary 02/05/2022 170 (H)  70 - 99 mg/dL Final   Glucose reference range applies only to samples taken after fasting for at least 8 hours.   Glucose-Capillary 02/05/2022 107 (H)  70 - 99 mg/dL Final   Glucose reference range applies only to samples taken after fasting for at least 8 hours.   Glucose-Capillary 02/05/2022 184 (H)  70 - 99 mg/dL Final   Glucose reference range applies only to samples taken after fasting for at least 8 hours.   Glucose-Capillary 02/05/2022 256 (H)  70 - 99 mg/dL Final   Glucose reference range applies only to samples taken after fasting for at least 8 hours.   Glucose-Capillary 02/06/2022 144 (H)  70 - 99 mg/dL Final   Glucose reference range applies only to samples taken after fasting for at least 8 hours.   Glucose-Capillary 02/06/2022 190 (H)  70 - 99 mg/dL Final   Glucose reference range applies only to samples taken after fasting for at least 8 hours.   Glucose-Capillary 02/06/2022 92  70 - 99 mg/dL Final   Glucose reference range applies only to samples taken after fasting  for at least 8 hours.   Trichomonas 02/06/2022 Negative   Final   Bacterial Vaginitis-Urine 02/06/2022 Negative   Final   Molecular Comment 02/06/2022 For tests bacteria and/or candida, this specimen does not meet the   Final   Molecular Comment 02/06/2022 strict criteria set by the FDA. The result interpretation should be   Final   Molecular Comment 02/06/2022 considered in conjunction with the patient's clinical history.   Final   Comment  02/06/2022 Normal Reference Range Trichomonas - Negative   Final   Glucose-Capillary 02/06/2022 197 (H)  70 - 99 mg/dL Final   Glucose reference range applies only to samples taken after fasting for at least 8 hours.   Glucose-Capillary 02/07/2022 129 (H)  70 - 99 mg/dL Final   Glucose reference range applies only to samples taken after fasting for at least 8 hours.   Glucose-Capillary 02/07/2022 207 (H)  70 - 99 mg/dL Final   Glucose reference range applies only to samples taken after fasting for at least 8 hours.   Glucose-Capillary 02/07/2022 153 (H)  70 - 99 mg/dL Final   Glucose reference range applies only to samples taken after fasting for at least 8 hours.   Glucose-Capillary 02/08/2022 129 (H)  70 - 99 mg/dL Final   Glucose reference range applies only to samples taken after fasting for at least 8 hours.   Glucose-Capillary 02/08/2022 107 (H)  70 - 99 mg/dL Final   Glucose reference range applies only to samples taken after fasting for at least 8 hours.  Admission on 01/23/2022, Discharged on 01/24/2022  Component Date Value Ref Range Status   Sodium 01/23/2022 140  135 - 145 mmol/L Final   Potassium 01/23/2022 4.4  3.5 - 5.1 mmol/L Final   Chloride 01/23/2022 101  98 - 111 mmol/L Final   CO2 01/23/2022 25  22 - 32 mmol/L Final   Glucose, Bld 01/23/2022 198 (H)  70 - 99 mg/dL Final   Glucose reference range applies only to samples taken after fasting for at least 8 hours.   BUN 01/23/2022 13  6 - 20 mg/dL Final   Creatinine, Ser 01/23/2022 0.62  0.44 - 1.00 mg/dL Final   Calcium 01/23/2022 9.3  8.9 - 10.3 mg/dL Final   GFR, Estimated 01/23/2022 >60  >60 mL/min Final   Comment: (NOTE) Calculated using the CKD-EPI Creatinine Equation (2021)    Anion gap 01/23/2022 14  5 - 15 Final   Performed at [facility], [place] [address], [place], [place] [address]   WBC 01/23/2022 5.8  4.0 - 10.5 K/uL Final   RBC 01/23/2022 4.63  3.87 - 5.11 MIL/uL Final   Hemoglobin 01/23/2022 11.7  (L)  12.0 - 15.0 g/dL Final   HCT 01/23/2022 37.4  36.0 - 46.0 % Final   MCV 01/23/2022 80.8  80.0 - 100.0 fL Final   MCH 01/23/2022 25.3 (L)  26.0 - 34.0 pg Final   MCHC 01/23/2022 31.3  30.0 - 36.0 g/dL Final   RDW 01/23/2022 17.9 (H)  11.5 - 15.5 % Final   Platelets 01/23/2022 333  150 - 400 K/uL Final   nRBC 01/23/2022 0.0  0.0 - 0.2 % Final   Performed at [place] [address], [place], [place] [address]   Troponin I (High Sensitivity) 01/23/2022 6  <18 ng/L Final   Comment: (NOTE) Elevated high sensitivity troponin I (hsTnI) values and significant  changes across serial measurements may suggest ACS but many other  chronic and acute conditions are known to elevate hsTnI results.  Refer to the "Links" section for chest pain algorithms and additional  guidance. Performed at [facility], [address] [place], [place] [address]    Troponin I (High Sensitivity) 01/23/2022 5  <18 ng/L Final   Comment: (NOTE) Elevated high sensitivity troponin I (hsTnI) values and significant  changes across serial measurements may suggest ACS but many other  chronic and acute conditions are known to elevate hsTnI results.  Refer to the "Links" section for chest pain algorithms and additional  guidance. Performed at [facility], [address] [place], [place] [address]    SARS Coronavirus 2 by RT PCR 01/23/2022 NEGATIVE  NEGATIVE Final   Comment: (NOTE) SARS-CoV-2 target nucleic acids are NOT DETECTED.  The SARS-CoV-2 RNA is generally detectable in upper and lower respiratory specimens during the acute phase of infection. The lowest concentration of SARS-CoV-2 viral copies this assay can detect is 250 copies / mL. A negative result does not preclude SARS-CoV-2 infection and should not be used as the sole basis for treatment or other patient management decisions.  A negative result may occur with improper specimen collection / handling, submission of specimen  other than nasopharyngeal swab, presence of viral mutation(s) within the areas targeted by this assay, and inadequate number of viral copies (<250 copies / mL). A negative result must be combined with clinical observations, patient history, and epidemiological information.  Fact Sheet for Patients:   RoadLapTop.co.zahttps://www.fda.gov/media/158405/download  Fact Sheet for Healthcare Providers: http://kim-miller.com/https://www.fda.gov/media/158404/download  This test is not yet approved or                           cleared by the [place] FDA and has been authorized for detection and/or diagnosis of SARS-CoV-2 by FDA under an Emergency Use Authorization (EUA).  This EUA will remain in effect (meaning this test can be used) for the duration of the COVID-19 declaration under Section 564(b)(1) of the Act, 21 U.S.C. section 360bbb-3(b)(1), unless the authorization is terminated or revoked sooner.  Performed at [facility], [address] [place], [place] [address]    B Natriuretic Peptide 01/23/2022 41.3  0.0 - 100.0 pg/mL Final   Performed at [facility], [address] [place], [place] [address]   Group A Strep by PCR 01/23/2022 NOT DETECTED  NOT DETECTED Final   Performed at [facility], [address] [place], [place] [address]   Color, Urine 01/23/2022 YELLOW  YELLOW Final   APPearance 01/23/2022 CLEAR  CLEAR Final   Specific Gravity, Urine 01/23/2022 1.018  1.005 - 1.030 Final   pH 01/23/2022 7.0  5.0 - 8.0 Final   Glucose, UA 01/23/2022 NEGATIVE  NEGATIVE mg/dL Final   Hgb urine dipstick 01/23/2022 NEGATIVE  NEGATIVE Final   Bilirubin Urine 01/23/2022 NEGATIVE  NEGATIVE Final   Ketones, ur 01/23/2022 NEGATIVE  NEGATIVE mg/dL Final   Protein, ur 13/08/657811/25/2023 NEGATIVE  NEGATIVE mg/dL Final   Nitrite 46/96/295211/25/2023 NEGATIVE  NEGATIVE Final   Leukocytes,Ua 01/23/2022 TRACE (A)  NEGATIVE Final   RBC / HPF 01/23/2022 0-5  0 - 5 RBC/hpf Final   WBC, UA 01/23/2022 0-5  0 - 5 WBC/hpf Final    Bacteria, UA 01/23/2022 NONE SEEN  NONE SEEN Final   Squamous Epithelial / HPF 01/23/2022 0-5  0 - 5 Final   Mucus 01/23/2022 PRESENT   Final   Performed  at [facility], [address], [place], [place] [address]   SARS Coronavirus 2 by RT PCR 01/23/2022 NEGATIVE  NEGATIVE Final   Comment: (NOTE) SARS-CoV-2 target nucleic acids are NOT DETECTED.  The SARS-CoV-2 RNA is generally detectable in upper respiratory specimens during the acute phase of infection. The lowest concentration of SARS-CoV-2 viral copies this assay can detect is 138 copies/mL. A negative result does not preclude SARS-Cov-2 infection and should not be used as the sole basis for treatment or other patient management decisions. A negative result may occur with  improper specimen collection/handling, submission of specimen other than nasopharyngeal swab, presence of viral mutation(s) within the areas targeted by this assay, and inadequate number of viral copies(<138 copies/mL). A negative result must be combined with clinical observations, patient history, and epidemiological information. The expected result is Negative.  Fact Sheet for Patients:  BloggerCourse.com  Fact Sheet for Healthcare Providers:  SeriousBroker.it  This test is no                          t yet approved or cleared by the [place] FDA and  has been authorized for detection and/or diagnosis of SARS-CoV-2 by FDA under an Emergency Use Authorization (EUA). This EUA will remain  in effect (meaning this test can be used) for the duration of the COVID-19 declaration under Section 564(b)(1) of the Act, 21 U.S.C.section 360bbb-3(b)(1), unless the authorization is terminated  or revoked sooner.       Influenza A by PCR 01/23/2022 NEGATIVE  NEGATIVE Final   Influenza B by PCR 01/23/2022 NEGATIVE  NEGATIVE Final   Comment: (NOTE) The Xpert Xpress SARS-CoV-2/FLU/RSV plus assay is intended as  an aid in the diagnosis of influenza from Nasopharyngeal swab specimens and should not be used as a sole basis for treatment. Nasal washings and aspirates are unacceptable for Xpert Xpress SARS-CoV-2/FLU/RSV testing.  Fact Sheet for Patients: BloggerCourse.com  Fact Sheet for Healthcare Providers: SeriousBroker.it  This test is not yet approved or cleared by the [place] FDA and has been authorized for detection and/or diagnosis of SARS-CoV-2 by FDA under an Emergency Use Authorization (EUA). This EUA will remain in effect (meaning this test can be used) for the duration of the COVID-19 declaration under Section 564(b)(1) of the Act, 21 U.S.C. section 360bbb-3(b)(1), unless the authorization is terminated or revoked.  Performed at [facility], [address], [place], [place] [address]    WBC 01/24/2022 6.1  4.0 - 10.5 K/uL Final   RBC 01/24/2022 4.60  3.87 - 5.11 MIL/uL Final   Hemoglobin 01/24/2022 11.7 (L)  12.0 - 15.0 g/dL Final   HCT 09/81/1914 37.0  36.0 - 46.0 % Final   MCV 01/24/2022 80.4  80.0 - 100.0 fL Final   MCH 01/24/2022 25.4 (L)  26.0 - 34.0 pg Final   MCHC 01/24/2022 31.6  30.0 - 36.0 g/dL Final   RDW 78/29/5621 17.6 (H)  11.5 - 15.5 % Final   Platelets 01/24/2022 334  150 - 400 K/uL Final   nRBC 01/24/2022 0.0  0.0 - 0.2 % Final   Neutrophils Relative % 01/24/2022 53  % Final   Neutro Abs 01/24/2022 3.3  1.7 - 7.7 K/uL Final   Lymphocytes Relative 01/24/2022 33  % Final   Lymphs Abs 01/24/2022 2.0  0.7 - 4.0 K/uL Final   Monocytes Relative 01/24/2022 11  % Final   Monocytes Absolute 01/24/2022 0.7  0.1 -  1.0 K/uL Final   Eosinophils Relative 01/24/2022 2  % Final   Eosinophils Absolute 01/24/2022 0.1  0.0 - 0.5 K/uL Final   Basophils Relative 01/24/2022 1  % Final   Basophils Absolute 01/24/2022 0.1  0.0 - 0.1 K/uL Final   Immature Granulocytes 01/24/2022 0  % Final   Abs Immature Granulocytes  01/24/2022 0.02  0.00 - 0.07 K/uL Final   Performed at [facility], [address], [place], [place] [address]   Glucose-Capillary 01/24/2022 110 (H)  70 - 99 mg/dL Final   Glucose reference range applies only to samples taken after fasting for at least 8 hours.  No results displayed because visit has over 200 results.    Admission on 11/22/2021, Discharged on 11/22/2021  Component Date Value Ref Range Status   Glucose-Capillary 11/22/2021 169 (H)  70 - 99 mg/dL Final   Glucose reference range applies only to samples taken after fasting for at least 8 hours.  No results displayed because visit has over 200 results.    Admission on 10/04/2021, Discharged on 10/22/2021  Component Date Value Ref Range Status   SARS Coronavirus 2 by RT PCR 10/04/2021 NEGATIVE  NEGATIVE Final   Comment: (NOTE) SARS-CoV-2 target nucleic acids are NOT DETECTED.  The SARS-CoV-2 RNA is generally detectable in upper respiratory specimens during the acute phase of infection. The lowest concentration of SARS-CoV-2 viral copies this assay can detect is 138 copies/mL. A negative result does not preclude SARS-Cov-2 infection and should not be used as the sole basis for treatment or other patient management decisions. A negative result may occur with  improper specimen collection/handling, submission of specimen other than nasopharyngeal swab, presence of viral mutation(s) within the areas targeted by this assay, and inadequate number of viral copies(<138 copies/mL). A negative result must be combined with clinical observations, patient history, and epidemiological information. The expected result is Negative.  Fact Sheet for Patients:  BloggerCourse.com  Fact Sheet for Healthcare Providers:  SeriousBroker.it  This test is no                          t yet approved or cleared by the [place] FDA and  has been authorized for detection and/or diagnosis  of SARS-CoV-2 by FDA under an Emergency Use Authorization (EUA). This EUA will remain  in effect (meaning this test can be used) for the duration of the COVID-19 declaration under Section 564(b)(1) of the Act, 21 U.S.C.section 360bbb-3(b)(1), unless the authorization is terminated  or revoked sooner.       Influenza A by PCR 10/04/2021 NEGATIVE  NEGATIVE Final   Influenza B by PCR 10/04/2021 NEGATIVE  NEGATIVE Final   Comment: (NOTE) The Xpert Xpress SARS-CoV-2/FLU/RSV plus assay is intended as an aid in the diagnosis of influenza from Nasopharyngeal swab specimens and should not be used as a sole basis for treatment. Nasal washings and aspirates are unacceptable for Xpert Xpress SARS-CoV-2/FLU/RSV testing.  Fact Sheet for Patients: BloggerCourse.com  Fact Sheet for Healthcare Providers: SeriousBroker.it  This test is not yet approved or cleared by the [place] FDA and has been authorized for detection and/or diagnosis of SARS-CoV-2 by FDA under an Emergency Use Authorization (EUA). This EUA will remain in effect (meaning this test can be used) for the duration of the COVID-19 declaration under Section 564(b)(1) of the Act, 21 U.S.C. section 360bbb-3(b)(1), unless the authorization is terminated or revoked.  Performed at [facility], [address] [place], [place] [address]    WBC 10/04/2021 8.4  4.0 - 10.5 K/uL Final   RBC 10/04/2021 4.42  3.87 - 5.11 MIL/uL Final   Hemoglobin 10/04/2021 11.6 (L)  12.0 - 15.0 g/dL Final   HCT 10/04/2021 35.7 (L)  36.0 - 46.0 % Final   MCV 10/04/2021 80.8  80.0 - 100.0 fL Final   MCH 10/04/2021 26.2  26.0 - 34.0 pg Final   MCHC 10/04/2021 32.5  30.0 - 36.0 g/dL Final   RDW 10/04/2021 16.0 (H)  11.5 - 15.5 % Final   Platelets 10/04/2021 372  150 - 400 K/uL Final   nRBC 10/04/2021 0.0  0.0 - 0.2 % Final   Neutrophils Relative % 10/04/2021 68  % Final   Neutro Abs 10/04/2021 5.7   1.7 - 7.7 K/uL Final   Lymphocytes Relative 10/04/2021 22  % Final   Lymphs Abs 10/04/2021 1.8  0.7 - 4.0 K/uL Final   Monocytes Relative 10/04/2021 8  % Final   Monocytes Absolute 10/04/2021 0.7  0.1 - 1.0 K/uL Final   Eosinophils Relative 10/04/2021 1  % Final   Eosinophils Absolute 10/04/2021 0.1  0.0 - 0.5 K/uL Final   Basophils Relative 10/04/2021 1  % Final   Basophils Absolute 10/04/2021 0.1  0.0 - 0.1 K/uL Final   Immature Granulocytes 10/04/2021 0  % Final   Abs Immature Granulocytes 10/04/2021 0.03  0.00 - 0.07 K/uL Final   Performed at [facility], [place] [address], [place], [place] [address]   Sodium 10/04/2021 136  135 - 145 mmol/L Final   Potassium 10/04/2021 4.2  3.5 - 5.1 mmol/L Final   Chloride 10/04/2021 104  98 - 111 mmol/L Final   CO2 10/04/2021 25  22 - 32 mmol/L Final   Glucose, Bld 10/04/2021 100 (H)  70 - 99 mg/dL Final   Glucose reference range applies only to samples taken after fasting for at least 8 hours.   BUN 10/04/2021 9  6 - 20 mg/dL Final   Creatinine, Ser 10/04/2021 0.52  0.44 - 1.00 mg/dL Final   Calcium 10/04/2021 9.0  8.9 - 10.3 mg/dL Final   Total Protein 10/04/2021 7.0  6.5 - 8.1 g/dL Final   Albumin 10/04/2021 3.2 (L)  3.5 - 5.0 g/dL Final   AST 10/04/2021 13 (L)  15 - 41 U/L Final   ALT 10/04/2021 10  0 - 44 U/L Final   Alkaline Phosphatase 10/04/2021 61  38 - 126 U/L Final   Total Bilirubin 10/04/2021 0.3  0.3 - 1.2 mg/dL Final   GFR, Estimated 10/04/2021 >60  >60 mL/min Final   Comment: (NOTE) Calculated using the CKD-EPI Creatinine Equation (2021)    Anion gap 10/04/2021 7  5 - 15 Final   Performed at [place] [address], [place], [place] [address]   Hgb A1c MFr Bld 10/04/2021 7.0 (H)  4.8 - 5.6 % Final   Comment: (NOTE) Pre diabetes:          5.7%-6.4%  Diabetes:              >6.4%  Glycemic control for   <7.0% adults with diabetes    Mean Plasma Glucose 10/04/2021 154.2  mg/dL Final   Performed at [facility], [place] [address], [place], [place] [address]   Cholesterol 10/04/2021 178  0 - 200 mg/dL Final   Triglycerides 10/04/2021 155 (H)  <150 mg/dL Final   HDL 10/04/2021  52  >40 mg/dL Final   Total CHOL/HDL Ratio 10/04/2021 3.4  RATIO Final   VLDL 10/04/2021 31  0 - 40 mg/dL Final   LDL Cholesterol 10/04/2021 95  0 - 99 mg/dL Final   Comment:        Total Cholesterol/HDL:CHD Risk Coronary Heart Disease Risk Table                     Men   Women  1/2 Average Risk   3.4   3.3  Average Risk       5.0   4.4  2 X Average Risk   9.6   7.1  3 X Average Risk  23.4   11.0        Use the calculated Patient Ratio above and the CHD Risk Table to determine the patient's CHD Risk.        ATP III CLASSIFICATION (LDL):  <100     mg/dL   Optimal  [id number]  mg/dL   Near or Above                    Optimal  130-159  mg/dL   Borderline  [id number]  mg/dL   High  >811>190     mg/dL   Very High Performed at [facility], [address] [place], [place] [address]    POC Amphetamine UR 10/04/2021 None Detected  NONE DETECTED (Cut Off Level 1000 ng/mL) Final   POC Secobarbital (BAR) 10/04/2021 None Detected  NONE DETECTED (Cut Off Level 300 ng/mL) Final   POC Buprenorphine (BUP) 10/04/2021 None Detected  NONE DETECTED (Cut Off Level 10 ng/mL) Final   POC Oxazepam (BZO) 10/04/2021 None Detected  NONE DETECTED (Cut Off Level 300 ng/mL) Final   POC Cocaine UR 10/04/2021 None Detected  NONE DETECTED (Cut Off Level 300 ng/mL) Final   POC Methamphetamine UR 10/04/2021 None Detected  NONE DETECTED (Cut Off Level 1000 ng/mL) Final   POC Morphine 10/04/2021 None Detected  NONE DETECTED (Cut Off Level 300 ng/mL) Final   POC Methadone UR 10/04/2021 None Detected  NONE DETECTED (Cut Off Level 300 ng/mL) Final   POC Oxycodone UR 10/04/2021 Positive (A)  NONE DETECTED (Cut Off Level 100 ng/mL) Final   POC Marijuana UR 10/04/2021 None Detected  NONE DETECTED (Cut Off Level 50 ng/mL) Final   SARSCOV2ONAVIRUS 2  AG 10/04/2021 NEGATIVE  NEGATIVE Final   Comment: (NOTE) SARS-CoV-2 antigen NOT DETECTED.   Negative results are presumptive.  Negative results do not preclude SARS-CoV-2 infection and should not be used as the sole basis for treatment or other patient management decisions, including infection  control decisions, particularly in the presence of clinical signs and  symptoms consistent with COVID-19, or in those who have been in contact with the virus.  Negative results must be combined with clinical observations, patient history, and epidemiological information. The expected result is Negative.  Fact Sheet for Patients: https://www.jennings-kim.com/https://www.fda.gov/media/141569/download  Fact Sheet for Healthcare Providers: https://alexander-rogers.biz/https://www.fda.gov/media/141568/download  This test is not yet approved or cleared by the [place] FDA and  has been authorized for detection and/or diagnosis of SARS-CoV-2 by FDA under an Emergency Use Authorization (EUA).  This EUA will remain in effect (meaning this test can be used) for the duration of  the COV                          ID-19 declaration under Section 564(b)(1) of  the Act, 21 U.S.C. section 360bbb-3(b)(1), unless the authorization is terminated or revoked sooner.     Valproic Acid Lvl 10/04/2021 51  50.0 - 100.0 ug/mL Final   Performed at [facility], [address] [place], [place] [id number]   Valproic Acid Lvl 10/08/2021 57  50.0 - 100.0 ug/mL Final   Performed at [facility], [address], [place], [place] [address]   Glucose-Capillary 10/09/2021 123 (H)  70 - 99 mg/dL Final   Glucose reference range applies only to samples taken after fasting for at least 8 hours.  There may be more visits with results that are not included.    Blood Alcohol level:  Lab Results  Component Value Date   ETH <10 11/23/2021   ETH <10 11/08/2021    Metabolic Disorder Labs: Lab Results  Component Value Date   HGBA1C 6.7 (H) 12/28/2021   MPG 145.59  12/28/2021   MPG 154.2 10/04/2021   Lab Results  Component Value Date   PROLACTIN 3.8 (L) 11/23/2021   Lab Results  Component Value Date   CHOL 171 11/23/2021   TRIG 125 11/23/2021   HDL 65 11/23/2021   CHOLHDL 2.6 11/23/2021   VLDL 25 11/23/2021   LDLCALC 81 11/23/2021   LDLCALC 95 10/04/2021    Therapeutic Lab Levels: No results found for: "LITHIUM" Lab Results  Component Value Date   VALPROATE 17 (L) 02/17/2022   VALPROATE 33 (L) 01/18/2022   No results found for: "CBMZ"  Physical Findings   PHQ2-9    Flowsheet Row ED from 11/23/2021 in [facility]  PHQ-2 Total Score 2  PHQ-9 Total Score 3      Flowsheet Row ED from 02/18/2022 in [facility] ED from 02/17/2022 in [facility] EM[facility]PART[facility] ED from 02/09/2022 in [facility]  C-SSRS RISK CATEGORY No Risk No Risk No Risk        Musculoskeletal  Strength & Muscle Tone: within normal limits Gait & Station: normal Patient leans: N/A  Psychiatric Specialty Exam  Presentation  General Appearance:  Casual; Disheveled  Eye Contact: Good  Speech: Clear and Coherent; Normal Rate  Speech Volume: Normal  Handedness: Right   Mood and Affect  Mood: Euthymic  Affect: Blunt   Thought Process  Thought Processes: Coherent; Goal Directed; Linear  Descriptions of Associations:Intact  Orientation:Full (Time, Place and Person)  Thought Content:Logical  Diagnosis of Schizophrenia or Schizoaffective disorder in past: Yes  Duration of Psychotic Symptoms: No data recorded  Hallucinations:Hallucinations: None  Ideas of Reference:None  Suicidal Thoughts:Suicidal Thoughts: No  Homicidal Thoughts:Homicidal Thoughts: No   Sensorium  Memory: Immediate Good  Judgment: Intact  Insight: Present   Executive Functions  Concentration: Fair  Attention Span: Fair  Recall: [employer]  of Knowledge: Fair  Language: Fair   Psychomotor Activity  Psychomotor Activity: Psychomotor Activity: Normal   Assets  Assets: Communication Skills; Desire for Improvement; Financial Resources/Insurance; Leisure Time; Resilience   Sleep  Sleep: Sleep: Good   No data recorded  Physical Exam  Physical Exam Constitutional:      General: She is not in acute distress.    Appearance: She is not ill-appearing, toxic-appearing or diaphoretic.  Eyes:     General: No scleral icterus. Cardiovascular:     Rate and Rhythm: Tachycardia present.  Pulmonary:     Effort: Pulmonary effort is normal. No respiratory distress.  Neurological:     Mental  Status: She is alert and oriented to person, place, and time.  Psychiatric:        Attention and Perception: Attention and perception normal.        Mood and Affect: Mood normal. Affect is blunt.        Speech: Speech normal.        Behavior: Behavior normal. Behavior is cooperative.        Thought Content: Thought content normal.    Review of Systems  Constitutional:  Negative for chills and fever.  Respiratory:  Negative for shortness of breath.   Cardiovascular:  Negative for chest pain and palpitations.  Gastrointestinal:  Negative for abdominal pain.  Neurological:  Negative for headaches.   Blood pressure (!) 124/95, pulse (!) 104, temperature 98 F (36.7 C), temperature source Oral, resp. rate 18, SpO2 96 %. There is no height or weight on file to calculate BMI.  Treatment Plan Summary: Plan Pt remains psychiatrically cleared. Pending discharge on 03/15/22 to [facility].  Lauree Chandler, NP 03/11/2022 8:23 AM

## 2022-03-11 NOTE — ED Notes (Signed)
Pt in bathroom at this hour. No apparent distress. RR even and unlabored. Monitored for safety.

## 2022-03-11 NOTE — Progress Notes (Signed)
Received Teresa Murillo this AM asleep in her chair bed, she woke up on her own. She received breakfast, medication and returned to bed. Later she completed her regimen of medications and was given a yogurt for a snack. She denied all of the psychiatric symptoms this AM including feeling suicidal.

## 2022-03-11 NOTE — ED Notes (Signed)
Pt awake at this hour. No apparent distress. RR even and unlabored. Monitored for safety.

## 2022-03-12 DIAGNOSIS — F209 Schizophrenia, unspecified: Secondary | ICD-10-CM | POA: Diagnosis not present

## 2022-03-12 LAB — GLUCOSE, CAPILLARY
Glucose-Capillary: 114 mg/dL — ABNORMAL HIGH (ref 70–99)
Glucose-Capillary: 237 mg/dL — ABNORMAL HIGH (ref 70–99)
Glucose-Capillary: 108 mg/dL — ABNORMAL HIGH (ref 70–99)
Glucose-Capillary: 175 mg/dL — ABNORMAL HIGH (ref 70–99)

## 2022-03-12 NOTE — ED Notes (Signed)
Pt resting quietly, breathing is even and unlabored.  Pt denies SI, HI, pain and AVH.  Will continue to monitor for safety.  

## 2022-03-12 NOTE — ED Provider Notes (Signed)
Behavioral Health Progress Note  Date and Time: 03/12/2022 8:40 AM Name: Teresa Murillo MRN:  [id number]  Subjective:    Teresa Murillo is a 62 y/o female with PMH of schizophrenia, borderline intellectual functioning, tobacco use d/o, HTN, HLD, DM2 (insulin-dependent), atrial flutter, COPD, admitted to [facility] on 11/23/21 voluntarily, accompanied by [employer] and reported that she was locked out of her group home. It was later confirmed that Teresa Murillo was dismissed from group home due to recurrent history of eloping from the group home. Teresa Murillo has been boarding at this facility until placement is secured.  "I'm alright"  Teresa Murillo is sleeping on my approach, awakens to name being called. She endorses euthymic mood, reports "I'm alright". She denies mood disturbances, including depression, anxiety, euphoria, other mood disturbances. She denies suicidal, homicidal or violent ideations. She denies auditory visual hallucinations or paranoia. She continues to remain cooperative and pleasant with staff and peers. Objectively, there is no evidence of agitation, aggression, distractibility or internal preoccupation. No delusions or paranoia elicited. She is calm, cooperative, pleasant. She reports readiness to discharge on 03/15/22 to [place] and verbalizes feeling grateful to staff for assistance. Pt remains psychiatrically cleared. Pending discharge on 03/15/22 to [facility].  Diagnosis:  Final diagnoses:  Schizophrenia, unspecified type ([place])    Total Time spent with patient: 15 minutes  Past Psychiatric History: see HPI Past Medical History:  Past Medical History:  Diagnosis Date   Borderline intellectual functioning 12/14/2021   On 12/14/2021: Appreciate assistance from psychology consult. On the Wechsler Adult Intelligence Scale-4, Teresa Murillo achieved a full-scale IQ score of 73 and a percentile rank of 4 placing her in the borderline range of intellectual  functioning.    Chronic obstructive pulmonary disease (COPD) (HCC)    Glaucoma    Hyperlipidemia    Hypertension    Iron deficiency    Schizoaffective disorder (HCC)    Type 2 diabetes mellitus (HCC)     Past Surgical History:  Procedure Laterality Date   TUBAL LIGATION     Family History:  Family History  Problem Relation Age of Onset   Breast cancer Maternal Grandmother    Family Psychiatric  History: None reported Social History:  Social History   Substance and Sexual Activity  Alcohol Use Not Currently     Social History   Substance and Sexual Activity  Drug Use Not Currently    Social History   Socioeconomic History   Marital status: Divorced    Spouse name: Not on file   Number of children: Not on file   Years of education: Not on file   Highest education level: Not on file  Occupational History   Not on file  Tobacco Use   Smoking status: Former    Packs/day: 1.00    Types: Cigars, Cigarettes    Quit date: 11/09/2021    Years since quitting: 0.3   Smokeless tobacco: Current  Vaping Use   Vaping Use: Never used  Substance and Sexual Activity   Alcohol use: Not Currently   Drug use: Not Currently   Sexual activity: Not Currently    Birth control/protection: Surgical  Other Topics Concern   Not on file  Social History Narrative   Not on file   Social Determinants of Health   Financial Resource Strain: Not on file  Food Insecurity: Not on file  Transportation Needs: Not on file  Physical Activity: Not on file  Stress: Not on file  Social Connections:  Not on file   SDOH:  SDOH Screenings   Depression (PHQ2-9): Low Risk  (11/23/2021)  Tobacco Use: High Risk (03/08/2022)   Additional Social History:                         Sleep: Good  Appetite:  Good  Current Medications:  Current Facility-Administered Medications  Medication Dose Route Frequency Provider Last Rate Last Admin   acetaminophen (TYLENOL) tablet 650 mg  650 mg  Oral Q6H PRN [name], NP   650 mg at 03/03/22 2130   albuterol (VENTOLIN HFA) 108 (90 Base) MCG/ACT inhaler 2 puff  2 puff Inhalation Q6H PRN [name], NP   2 puff at 02/20/22 1944   alum & mag hydroxide-simeth (MAALOX/MYLANTA) 200-200-20 MG/5ML suspension 30 mL  30 mL Oral Q4H PRN [name], NP   30 mL at 03/07/22 2155   apixaban (ELIQUIS) tablet 5 mg  5 mg Oral BID [name], NP   5 mg at 03/11/22 2129   ARIPiprazole (ABILIFY) tablet 10 mg  10 mg Oral Daily [name], NP   10 mg at 03/11/22 0926   cloZAPine (CLOZARIL) tablet 75 mg  75 mg Oral BID France Ravens, MD   75 mg at 03/11/22 2128   colchicine tablet 0.6 mg  0.6 mg Oral QHS [name], NP   0.6 mg at 03/11/22 2129   diltiazem (CARDIZEM CD) 24 hr capsule 240 mg  240 mg Oral Daily [name], NP   240 mg at 03/11/22 0924   divalproex (DEPAKOTE ER) 24 hr tablet 500 mg  500 mg Oral BID [name], NP   500 mg at 03/11/22 2129   haloperidol (HALDOL) tablet 10 mg  10 mg Oral TID PRN [name], NP       insulin aspart (novoLOG) injection 0-9 Units  0-9 Units Subcutaneous TID WC France Ravens, MD   1 Units at 03/11/22 1743   insulin glargine-yfgn (SEMGLEE) injection 12 Units  12 Units Subcutaneous Daily [name], NP   12 Units at 03/11/22 0926   latanoprost (XALATAN) 0.005 % ophthalmic solution 1 drop  1 drop Both Eyes QHS [name], NP   1 drop at 03/11/22 2129   magnesium hydroxide (MILK OF MAGNESIA) suspension 30 mL  30 mL Oral Daily PRN [name], NP       metFORMIN (GLUCOPHAGE) tablet 500 mg  500 mg Oral BID WC [name], NP   500 mg at 03/12/22 0803   mometasone-formoterol (DULERA) 200-5 MCG/ACT inhaler 2 puff  2 puff Inhalation BID [name], NP   2 puff at 03/12/22 0804   multivitamins with iron tablet 1 tablet  1 tablet Oral QHS [name], DO   1 tablet at 03/11/22 2128   nicotine (NICODERM CQ - dosed in mg/24 hr) patch 7 mg  7 mg Transdermal Daily [name], NP   7 mg at 03/11/22  [id number]   rosuvastatin (CRESTOR) tablet 20 mg  20 mg Oral QHS [name], DO   20 mg at 03/11/22 2129   valbenazine (INGREZZA) capsule 40 mg  40 mg Oral q AM [name], NP   40 mg at 03/12/22 0601   Vitamin D (Ergocalciferol) (DRISDOL) 1.25 MG (50000 UNIT) capsule 50,000 Units  50,000 Units Oral Q Leonie Green, NP   50,000 Units at 03/11/22 [id number]   Current Outpatient Medications  Medication Sig Dispense Refill   Accu-Chek Softclix Lancets lancets Use as directed  up to four times daily 100 each 0   apixaban (ELIQUIS) 5 MG TABS tablet Take 1 tablet (5 mg total) by mouth 2 (two) times daily. 60 tablet 0   ARIPiprazole (ABILIFY) 10 MG tablet Take 1 tablet (10 mg total) by mouth daily. 30 tablet 0   Blood Glucose Monitoring Suppl (ACCU-CHEK GUIDE) w/Device KIT Use as directed up to four times daily 1 kit 0   budesonide-formoterol (SYMBICORT) 160-4.5 MCG/ACT inhaler Inhale 2 puffs into the lungs in the morning and at bedtime.     Cholecalciferol (VITAMIN D3) 1.25 MG (50000 UT) CAPS Take 50,000 Units by mouth every Thursday.     clozapine (CLOZARIL) 50 MG tablet Take 1 tablet (50 mg total) by mouth daily. 30 tablet 0   colchicine 0.6 MG tablet Take 1 tablet (0.6 mg total) by mouth at bedtime. 30 tablet 0   diltiazem (CARDIZEM CD) 240 MG 24 hr capsule Take 1 capsule (240 mg total) by mouth daily. (Patient not taking: Reported on 11/24/2021) 30 capsule 0   divalproex (DEPAKOTE ER) 500 MG 24 hr tablet Take 1 tablet (500 mg total) by mouth 2 (two) times daily. 60 tablet 0   glucose blood test strip Use as directed up to four times daily 50 each 0   haloperidol (HALDOL) 10 MG tablet Take 1 tablet (10 mg total) by mouth 3 (three) times daily as needed for agitation (and psychotic symptoms).     INGREZZA 40 MG capsule Take 1 capsule (40 mg total) by mouth in the morning. 30 capsule 0   insulin aspart (NOVOLOG) 100 UNIT/ML FlexPen Before each meal 3 times a day, 140-199 - 2 units, 200-250 - 4 units,  251-299 - 6 units,  300-349 - 8 units,  350 or above 10 units. Insulin PEN if approved, provide syringes and needles if needed.Please switch to any approved short acting Insulin if needed. 15 mL 0   insulin glargine (LANTUS) 100 UNIT/ML Solostar Pen Inject 12 Units into the skin daily. 15 mL 0   Insulin Pen Needle 32G X 4 MM MISC Use 4 times a day with insulin, 1 month supply. 100 each 0   latanoprost (XALATAN) 0.005 % ophthalmic solution Place 1 drop into both eyes at bedtime.     metFORMIN (GLUCOPHAGE) 500 MG tablet Take 500 mg by mouth 2 (two) times daily with a meal.     nicotine (NICODERM CQ - DOSED IN MG/24 HR) 7 mg/24hr patch Place 1 patch (7 mg total) onto the skin daily. 28 patch 0   PROAIR HFA 108 (90 Base) MCG/ACT inhaler Inhale 2 puffs into the lungs every 6 (six) hours as needed for wheezing or shortness of breath.      Labs  Lab Results:  Admission on 02/18/2022  Component Date Value Ref Range Status   Glucose-Capillary 02/18/2022 254 (H)  70 - 99 mg/dL Final   Glucose reference range applies only to samples taken after fasting for at least 8 hours.   Glucose-Capillary 02/18/2022 112 (H)  70 - 99 mg/dL Final   Glucose reference range applies only to samples taken after fasting for at least 8 hours.   Glucose-Capillary 02/19/2022 159 (H)  70 - 99 mg/dL Final   Glucose reference range applies only to samples taken after fasting for at least 8 hours.   Glucose-Capillary 02/19/2022 160 (H)  70 - 99 mg/dL Final   Glucose reference range applies only to samples taken after fasting for at least 8 hours.  Glucose-Capillary 02/19/2022 177 (H)  70 - 99 mg/dL Final   Glucose reference range applies only to samples taken after fasting for at least 8 hours.   Glucose-Capillary 02/19/2022 147 (H)  70 - 99 mg/dL Final   Glucose reference range applies only to samples taken after fasting for at least 8 hours.   Glucose-Capillary 02/20/2022 126 (H)  70 - 99 mg/dL Final   Glucose reference  range applies only to samples taken after fasting for at least 8 hours.   Glucose-Capillary 02/20/2022 132 (H)  70 - 99 mg/dL Final   Glucose reference range applies only to samples taken after fasting for at least 8 hours.   Glucose-Capillary 02/20/2022 125 (H)  70 - 99 mg/dL Final   Glucose reference range applies only to samples taken after fasting for at least 8 hours.   Glucose-Capillary 02/21/2022 151 (H)  70 - 99 mg/dL Final   Glucose reference range applies only to samples taken after fasting for at least 8 hours.   Glucose-Capillary 02/21/2022 106 (H)  70 - 99 mg/dL Final   Glucose reference range applies only to samples taken after fasting for at least 8 hours.   Glucose-Capillary 02/21/2022 149 (H)  70 - 99 mg/dL Final   Glucose reference range applies only to samples taken after fasting for at least 8 hours.   Glucose-Capillary 02/22/2022 131 (H)  70 - 99 mg/dL Final   Glucose reference range applies only to samples taken after fasting for at least 8 hours.   Glucose-Capillary 02/22/2022 141 (H)  70 - 99 mg/dL Final   Glucose reference range applies only to samples taken after fasting for at least 8 hours.   Glucose-Capillary 02/22/2022 183 (H)  70 - 99 mg/dL Final   Glucose reference range applies only to samples taken after fasting for at least 8 hours.   Glucose-Capillary 02/23/2022 133 (H)  70 - 99 mg/dL Final   Glucose reference range applies only to samples taken after fasting for at least 8 hours.   WBC 02/23/2022 4.8  4.0 - 10.5 K/uL Final   RBC 02/23/2022 3.96  3.87 - 5.11 MIL/uL Final   Hemoglobin 02/23/2022 9.7 (L)  12.0 - 15.0 g/dL Final   HCT 02/23/2022 30.6 (L)  36.0 - 46.0 % Final   MCV 02/23/2022 77.3 (L)  80.0 - 100.0 fL Final   MCH 02/23/2022 24.5 (L)  26.0 - 34.0 pg Final   MCHC 02/23/2022 31.7  30.0 - 36.0 g/dL Final   RDW 02/23/2022 16.5 (H)  11.5 - 15.5 % Final   Platelets 02/23/2022 372  150 - 400 K/uL Final   nRBC 02/23/2022 0.0  0.0 - 0.2 % Final    Neutrophils Relative % 02/23/2022 56  % Final   Neutro Abs 02/23/2022 2.7  1.7 - 7.7 K/uL Final   Lymphocytes Relative 02/23/2022 32  % Final   Lymphs Abs 02/23/2022 1.5  0.7 - 4.0 K/uL Final   Monocytes Relative 02/23/2022 9  % Final   Monocytes Absolute 02/23/2022 0.4  0.1 - 1.0 K/uL Final   Eosinophils Relative 02/23/2022 2  % Final   Eosinophils Absolute 02/23/2022 0.1  0.0 - 0.5 K/uL Final   Basophils Relative 02/23/2022 1  % Final   Basophils Absolute 02/23/2022 0.1  0.0 - 0.1 K/uL Final   Immature Granulocytes 02/23/2022 0  % Final   Abs Immature Granulocytes 02/23/2022 0.01  0.00 - 0.07 K/uL Final   Performed at [facility], [address] [place], [place] [address]   Glucose-Capillary 02/23/2022 202 (H)  70 - 99 mg/dL Final   Glucose reference range applies only to samples taken after fasting for at least 8 hours.   Glucose-Capillary 02/23/2022 143 (H)  70 - 99 mg/dL Final   Glucose reference range applies only to samples taken after fasting for at least 8 hours.   Glucose-Capillary 02/23/2022 143 (H)  70 - 99 mg/dL Final   Glucose reference range applies only to samples taken after fasting for at least 8 hours.   Glucose-Capillary 02/24/2022 207 (H)  70 - 99 mg/dL Final   Glucose reference range applies only to samples taken after fasting for at least 8 hours.   Glucose-Capillary 02/24/2022 109 (H)  70 - 99 mg/dL Final   Glucose reference range applies only to samples taken after fasting for at least 8 hours.   Glucose-Capillary 02/24/2022 127 (H)  70 - 99 mg/dL Final   Glucose reference range applies only to samples taken after fasting for at least 8 hours.   Glucose-Capillary 02/24/2022 130 (H)  70 - 99 mg/dL Final   Glucose reference range applies only to samples taken after fasting for at least 8 hours.   Glucose-Capillary 02/25/2022 117 (H)  70 - 99 mg/dL Final   Glucose reference range applies only to samples taken after fasting for at least 8 hours.    Glucose-Capillary 02/25/2022 104 (H)  70 - 99 mg/dL Final   Glucose reference range applies only to samples taken after fasting for at least 8 hours.   Glucose-Capillary 02/25/2022 180 (H)  70 - 99 mg/dL Final   Glucose reference range applies only to samples taken after fasting for at least 8 hours.   Glucose-Capillary 02/25/2022 180 (H)  70 - 99 mg/dL Final   Glucose reference range applies only to samples taken after fasting for at least 8 hours.   Glucose-Capillary 02/26/2022 133 (H)  70 - 99 mg/dL Final   Glucose reference range applies only to samples taken after fasting for at least 8 hours.   Glucose-Capillary 02/26/2022 85  70 - 99 mg/dL Final   Glucose reference range applies only to samples taken after fasting for at least 8 hours.   Glucose-Capillary 02/26/2022 136 (H)  70 - 99 mg/dL Final   Glucose reference range applies only to samples taken after fasting for at least 8 hours.   Glucose-Capillary 02/26/2022 170 (H)  70 - 99 mg/dL Final   Glucose reference range applies only to samples taken after fasting for at least 8 hours.   Glucose-Capillary 02/26/2022 185 (H)  70 - 99 mg/dL Final   Glucose reference range applies only to samples taken after fasting for at least 8 hours.   Glucose-Capillary 02/27/2022 124 (H)  70 - 99 mg/dL Final   Glucose reference range applies only to samples taken after fasting for at least 8 hours.   Glucose-Capillary 02/27/2022 161 (H)  70 - 99 mg/dL Final   Glucose reference range applies only to samples taken after fasting for at least 8 hours.   Glucose-Capillary 02/27/2022 170 (H)  70 - 99 mg/dL Final   Glucose reference range applies only to samples taken after fasting for at least 8 hours.   Glucose-Capillary 02/27/2022 154 (H)  70 - 99 mg/dL Final   Glucose reference range applies only to samples taken after fasting for at least 8 hours.   Glucose-Capillary 02/28/2022 109 (H)  70 - 99 mg/dL Final  Glucose reference range applies only to  samples taken after fasting for at least 8 hours.   Glucose-Capillary 02/28/2022 134 (H)  70 - 99 mg/dL Final   Glucose reference range applies only to samples taken after fasting for at least 8 hours.   Glucose-Capillary 02/28/2022 160 (H)  70 - 99 mg/dL Final   Glucose reference range applies only to samples taken after fasting for at least 8 hours.   Glucose-Capillary 02/28/2022 136 (H)  70 - 99 mg/dL Final   Glucose reference range applies only to samples taken after fasting for at least 8 hours.   Glucose-Capillary 02/28/2022 173 (H)  70 - 99 mg/dL Final   Glucose reference range applies only to samples taken after fasting for at least 8 hours.   Glucose-Capillary 03/01/2022 134 (H)  70 - 99 mg/dL Final   Glucose reference range applies only to samples taken after fasting for at least 8 hours.   Glucose-Capillary 03/01/2022 116 (H)  70 - 99 mg/dL Final   Glucose reference range applies only to samples taken after fasting for at least 8 hours.   Glucose-Capillary 03/01/2022 227 (H)  70 - 99 mg/dL Final   Glucose reference range applies only to samples taken after fasting for at least 8 hours.   Glucose-Capillary 03/01/2022 157 (H)  70 - 99 mg/dL Final   Glucose reference range applies only to samples taken after fasting for at least 8 hours.   Glucose-Capillary 03/02/2022 99  70 - 99 mg/dL Final   Glucose reference range applies only to samples taken after fasting for at least 8 hours.   WBC 03/02/2022 6.0  4.0 - 10.5 K/uL Final   RBC 03/02/2022 4.15  3.87 - 5.11 MIL/uL Final   Hemoglobin 03/02/2022 9.7 (L)  12.0 - 15.0 g/dL Final   HCT 03/02/2022 31.5 (L)  36.0 - 46.0 % Final   MCV 03/02/2022 75.9 (L)  80.0 - 100.0 fL Final   MCH 03/02/2022 23.4 (L)  26.0 - 34.0 pg Final   MCHC 03/02/2022 30.8  30.0 - 36.0 g/dL Final   RDW 03/02/2022 16.4 (H)  11.5 - 15.5 % Final   Platelets 03/02/2022 395  150 - 400 K/uL Final   nRBC 03/02/2022 0.0  0.0 - 0.2 % Final   Neutrophils Relative %  03/02/2022 61  % Final   Neutro Abs 03/02/2022 3.6  1.7 - 7.7 K/uL Final   Lymphocytes Relative 03/02/2022 29  % Final   Lymphs Abs 03/02/2022 1.7  0.7 - 4.0 K/uL Final   Monocytes Relative 03/02/2022 8  % Final   Monocytes Absolute 03/02/2022 0.5  0.1 - 1.0 K/uL Final   Eosinophils Relative 03/02/2022 1  % Final   Eosinophils Absolute 03/02/2022 0.1  0.0 - 0.5 K/uL Final   Basophils Relative 03/02/2022 1  % Final   Basophils Absolute 03/02/2022 0.1  0.0 - 0.1 K/uL Final   Immature Granulocytes 03/02/2022 0  % Final   Abs Immature Granulocytes 03/02/2022 0.01  0.00 - 0.07 K/uL Final   Performed at [facility], [place] [address], [place], [place] [address]   Glucose-Capillary 03/02/2022 171 (H)  70 - 99 mg/dL Final   Glucose reference range applies only to samples taken after fasting for at least 8 hours.   Glucose-Capillary 03/02/2022 108 (H)  70 - 99 mg/dL Final   Glucose reference range applies only to samples taken after fasting for at least 8 hours.   Glucose-Capillary 03/02/2022 203 (H)  70 - 99 mg/dL Final   Glucose reference range applies only to samples taken after fasting for at least 8 hours.   Glucose-Capillary 03/03/2022 128 (H)  70 - 99 mg/dL Final   Glucose reference range applies only to samples taken after fasting for at least 8 hours.   Glucose-Capillary 03/03/2022 134 (H)  70 - 99 mg/dL Final   Glucose reference range applies only to samples taken after fasting for at least 8 hours.   Glucose-Capillary 03/03/2022 100 (H)  70 - 99 mg/dL Final   Glucose reference range applies only to samples taken after fasting for at least 8 hours.   Glucose-Capillary 03/03/2022 160 (H)  70 - 99 mg/dL Final   Glucose reference range applies only to samples taken after fasting for at least 8 hours.   Glucose-Capillary 03/04/2022 116 (H)  70 - 99 mg/dL Final   Glucose reference range applies only to samples taken after fasting for at least 8 hours.   Glucose-Capillary 03/04/2022 104  (H)  70 - 99 mg/dL Final   Glucose reference range applies only to samples taken after fasting for at least 8 hours.   Glucose-Capillary 03/04/2022 156 (H)  70 - 99 mg/dL Final   Glucose reference range applies only to samples taken after fasting for at least 8 hours.   Glucose-Capillary 03/04/2022 201 (H)  70 - 99 mg/dL Final   Glucose reference range applies only to samples taken after fasting for at least 8 hours.   Glucose-Capillary 03/05/2022 120 (H)  70 - 99 mg/dL Final   Glucose reference range applies only to samples taken after fasting for at least 8 hours.   Glucose-Capillary 03/05/2022 237 (H)  70 - 99 mg/dL Final   Glucose reference range applies only to samples taken after fasting for at least 8 hours.   Glucose-Capillary 03/05/2022 168 (H)  70 - 99 mg/dL Final   Glucose reference range applies only to samples taken after fasting for at least 8 hours.   Glucose-Capillary 03/05/2022 200 (H)  70 - 99 mg/dL Final   Glucose reference range applies only to samples taken after fasting for at least 8 hours.   Glucose-Capillary 03/06/2022 135 (H)  70 - 99 mg/dL Final   Glucose reference range applies only to samples taken after fasting for at least 8 hours.   Glucose-Capillary 03/06/2022 135 (H)  70 - 99 mg/dL Final   Glucose reference range applies only to samples taken after fasting for at least 8 hours.   Glucose-Capillary 03/06/2022 235 (H)  70 - 99 mg/dL Final   Glucose reference range applies only to samples taken after fasting for at least 8 hours.   Glucose-Capillary 03/07/2022 141 (H)  70 - 99 mg/dL Final   Glucose reference range applies only to samples taken after fasting for at least 8 hours.   Glucose-Capillary 03/07/2022 157 (H)  70 - 99 mg/dL Final   Glucose reference range applies only to samples taken after fasting for at least 8 hours.   Glucose-Capillary 03/07/2022 199 (H)  70 - 99 mg/dL Final   Glucose reference range applies only to samples taken after fasting for  at least 8 hours.   Glucose-Capillary 03/07/2022 241 (H)  70 - 99 mg/dL Final   Glucose reference range applies only to samples taken after fasting for at least 8 hours.   Glucose-Capillary 03/08/2022 123 (H)  70 - 99 mg/dL Final   Glucose reference range applies only to samples taken after fasting for  at least 8 hours.   Glucose-Capillary 03/06/2022 155 (H)  70 - 99 mg/dL Final   Glucose reference range applies only to samples taken after fasting for at least 8 hours.   Glucose-Capillary 03/08/2022 197 (H)  70 - 99 mg/dL Final   Glucose reference range applies only to samples taken after fasting for at least 8 hours.   Glucose-Capillary 03/08/2022 158 (H)  70 - 99 mg/dL Final   Glucose reference range applies only to samples taken after fasting for at least 8 hours.   Glucose-Capillary 03/09/2022 118 (H)  70 - 99 mg/dL Final   Glucose reference range applies only to samples taken after fasting for at least 8 hours.   Glucose-Capillary 03/09/2022 142 (H)  70 - 99 mg/dL Final   Glucose reference range applies only to samples taken after fasting for at least 8 hours.   Glucose-Capillary 03/09/2022 243 (H)  70 - 99 mg/dL Final   Glucose reference range applies only to samples taken after fasting for at least 8 hours.   Glucose-Capillary 03/09/2022 171 (H)  70 - 99 mg/dL Final   Glucose reference range applies only to samples taken after fasting for at least 8 hours.   Glucose-Capillary 03/10/2022 133 (H)  70 - 99 mg/dL Final   Glucose reference range applies only to samples taken after fasting for at least 8 hours.   WBC 03/10/2022 7.8  4.0 - 10.5 K/uL Final   RBC 03/10/2022 4.58  3.87 - 5.11 MIL/uL Final   Hemoglobin 03/10/2022 10.7 (L)  12.0 - 15.0 g/dL Final   HCT 03/10/2022 34.7 (L)  36.0 - 46.0 % Final   MCV 03/10/2022 75.8 (L)  80.0 - 100.0 fL Final   MCH 03/10/2022 23.4 (L)  26.0 - 34.0 pg Final   MCHC 03/10/2022 30.8  30.0 - 36.0 g/dL Final   RDW 03/10/2022 17.0 (H)  11.5 - 15.5 %  Final   Platelets 03/10/2022 347  150 - 400 K/uL Final   nRBC 03/10/2022 0.0  0.0 - 0.2 % Final   Neutrophils Relative % 03/10/2022 62  % Final   Neutro Abs 03/10/2022 4.8  1.7 - 7.7 K/uL Final   Lymphocytes Relative 03/10/2022 26  % Final   Lymphs Abs 03/10/2022 2.1  0.7 - 4.0 K/uL Final   Monocytes Relative 03/10/2022 10  % Final   Monocytes Absolute 03/10/2022 0.7  0.1 - 1.0 K/uL Final   Eosinophils Relative 03/10/2022 1  % Final   Eosinophils Absolute 03/10/2022 0.1  0.0 - 0.5 K/uL Final   Basophils Relative 03/10/2022 1  % Final   Basophils Absolute 03/10/2022 0.1  0.0 - 0.1 K/uL Final   Immature Granulocytes 03/10/2022 0  % Final   Abs Immature Granulocytes 03/10/2022 0.03  0.00 - 0.07 K/uL Final   Performed at [facility], [place] [address] [place], [place] [address]   Glucose-Capillary 03/10/2022 107 (H)  70 - 99 mg/dL Final   Glucose reference range applies only to samples taken after fasting for at least 8 hours.   Glucose-Capillary 03/10/2022 225 (H)  70 - 99 mg/dL Final   Glucose reference range applies only to samples taken after fasting for at least 8 hours.   Glucose-Capillary 03/10/2022 155 (H)  70 - 99 mg/dL Final   Glucose reference range applies only to samples taken after fasting for at least 8 hours.   Glucose-Capillary 03/11/2022 127 (H)  70 - 99 mg/dL Final   Glucose reference range applies  only to samples taken after fasting for at least 8 hours.   Glucose-Capillary 03/11/2022 104 (H)  70 - 99 mg/dL Final   Glucose reference range applies only to samples taken after fasting for at least 8 hours.   Glucose-Capillary 03/11/2022 123 (H)  70 - 99 mg/dL Final   Glucose reference range applies only to samples taken after fasting for at least 8 hours.   Glucose-Capillary 03/11/2022 131 (H)  70 - 99 mg/dL Final   Glucose reference range applies only to samples taken after fasting for at least 8 hours.   Glucose-Capillary 03/12/2022 114 (H)  70 - 99 mg/dL Final    Glucose reference range applies only to samples taken after fasting for at least 8 hours.  Admission on 02/17/2022, Discharged on 02/18/2022  Component Date Value Ref Range Status   Sodium 02/17/2022 136  135 - 145 mmol/L Final   Potassium 02/17/2022 3.9  3.5 - 5.1 mmol/L Final   Chloride 02/17/2022 101  98 - 111 mmol/L Final   CO2 02/17/2022 25  22 - 32 mmol/L Final   Glucose, Bld 02/17/2022 113 (H)  70 - 99 mg/dL Final   Glucose reference range applies only to samples taken after fasting for at least 8 hours.   BUN 02/17/2022 9  6 - 20 mg/dL Final   Creatinine, Ser 02/17/2022 0.50  0.44 - 1.00 mg/dL Final   Calcium 02/17/2022 8.9  8.9 - 10.3 mg/dL Final   GFR, Estimated 02/17/2022 >60  >60 mL/min Final   Comment: (NOTE) Calculated using the CKD-EPI Creatinine Equation (2021)    Anion gap 02/17/2022 10  5 - 15 Final   Performed at [facility], [place] [address] [place], [place] [address]   WBC 02/17/2022 6.5  4.0 - 10.5 K/uL Final   RBC 02/17/2022 4.24  3.87 - 5.11 MIL/uL Final   Hemoglobin 02/17/2022 10.2 (L)  12.0 - 15.0 g/dL Final   HCT 02/17/2022 33.2 (L)  36.0 - 46.0 % Final   MCV 02/17/2022 78.3 (L)  80.0 - 100.0 fL Final   MCH 02/17/2022 24.1 (L)  26.0 - 34.0 pg Final   MCHC 02/17/2022 30.7  30.0 - 36.0 g/dL Final   RDW 02/17/2022 17.2 (H)  11.5 - 15.5 % Final   Platelets 02/17/2022 390  150 - 400 K/uL Final   nRBC 02/17/2022 0.0  0.0 - 0.2 % Final   Performed at [facility], [place] [address], [place], [place] [address]   Troponin I (High Sensitivity) 02/17/2022 4  <18 ng/L Final   Comment: (NOTE) Elevated high sensitivity troponin I (hsTnI) values and significant  changes across serial measurements may suggest ACS but many other  chronic and acute conditions are known to elevate hsTnI results.  Refer to the "Links" section for chest pain algorithms and additional  guidance. Performed at [facility], [place] [address], [place], [place] [address]     Valproic Acid Lvl 02/17/2022 17 (L)  50.0 - 100.0 ug/mL Final   Performed at [place] [address], [place],  [address]   B Natriuretic Peptide 02/17/2022 30.9  0.0 - 100.0 pg/mL Final   Performed at [place] [address], [place], [place] [address]   Troponin I (High Sensitivity) 02/18/2022 4  <18 ng/L Final   Comment: (NOTE) Elevated high sensitivity troponin I (hsTnI) values and significant  changes across serial measurements may suggest ACS but many other  chronic and  acute conditions are known to elevate hsTnI results.  Refer to the "Links" section for chest pain algorithms and additional  guidance. Performed at [facility], [place] [address], [place], [place] [address]    D-Dimer, Quant 02/18/2022 0.29  0.00 - 0.50 ug/mL-FEU Final   Comment: (NOTE) At the manufacturer cut-off value of 0.5 g/mL FEU, this assay has a negative predictive value of 95-100%.This assay is intended for use in conjunction with a clinical pretest probability (PTP) assessment model to exclude pulmonary embolism (PE) and deep venous thrombosis (DVT) in outpatients suspected of PE or DVT. Results should be correlated with clinical presentation. Performed at [facility], [place] [address], [place], [place] [address]   Admission on 02/09/2022, Discharged on 02/17/2022  Component Date Value Ref Range Status   Glucose-Capillary 02/09/2022 118 (H)  70 - 99 mg/dL Final   Glucose reference range applies only to samples taken after fasting for at least 8 hours.   Glucose-Capillary 02/10/2022 148 (H)  70 - 99 mg/dL Final   Glucose reference range applies only to samples taken after fasting for at least 8 hours.   Glucose-Capillary 02/10/2022 174 (H)  70 - 99 mg/dL Final   Glucose reference range applies only to samples taken after fasting for at least 8 hours.   Glucose-Capillary 02/10/2022 128 (H)  70 - 99 mg/dL Final   Glucose reference range applies only to samples taken  after fasting for at least 8 hours.   Glucose-Capillary 02/10/2022 182 (H)  70 - 99 mg/dL Final   Glucose reference range applies only to samples taken after fasting for at least 8 hours.   Glucose-Capillary 02/11/2022 158 (H)  70 - 99 mg/dL Final   Glucose reference range applies only to samples taken after fasting for at least 8 hours.   Glucose-Capillary 02/11/2022 159 (H)  70 - 99 mg/dL Final   Glucose reference range applies only to samples taken after fasting for at least 8 hours.   Glucose-Capillary 02/11/2022 153 (H)  70 - 99 mg/dL Final   Glucose reference range applies only to samples taken after fasting for at least 8 hours.   Glucose-Capillary 02/11/2022 159 (H)  70 - 99 mg/dL Final   Glucose reference range applies only to samples taken after fasting for at least 8 hours.   Glucose-Capillary 02/11/2022 153 (H)  70 - 99 mg/dL Final   Glucose reference range applies only to samples taken after fasting for at least 8 hours.   Glucose-Capillary 02/11/2022 109 (H)  70 - 99 mg/dL Final   Glucose reference range applies only to samples taken after fasting for at least 8 hours.   Glucose-Capillary 02/11/2022 213 (H)  70 - 99 mg/dL Final   Glucose reference range applies only to samples taken after fasting for at least 8 hours.   Glucose-Capillary 02/11/2022 229 (H)  70 - 99 mg/dL Final   Glucose reference range applies only to samples taken after fasting for at least 8 hours.   Glucose-Capillary 02/12/2022 128 (H)  70 - 99 mg/dL Final   Glucose reference range applies only to samples taken after fasting for at least 8 hours.   Glucose-Capillary 02/12/2022 149 (H)  70 - 99 mg/dL Final   Glucose reference range applies only to samples taken after fasting for at least 8 hours.   Color, Urine 02/12/2022 YELLOW  YELLOW Final   APPearance 02/12/2022 CLEAR  CLEAR Final   Specific Gravity, Urine 02/12/2022 1.015  1.005 -  1.030 Final   pH 02/12/2022 5.0  5.0 - 8.0 Final   Glucose, UA  02/12/2022 NEGATIVE  NEGATIVE mg/dL Final   Hgb urine dipstick 02/12/2022 NEGATIVE  NEGATIVE Final   Bilirubin Urine 02/12/2022 NEGATIVE  NEGATIVE Final   Ketones, ur 02/12/2022 NEGATIVE  NEGATIVE mg/dL Final   Protein, ur 02/12/2022 NEGATIVE  NEGATIVE mg/dL Final   Nitrite 02/12/2022 NEGATIVE  NEGATIVE Final   Leukocytes,Ua 02/12/2022 NEGATIVE  NEGATIVE Final   Performed at [facility], [place] [address], [place], [place] [address]   Specimen Description 02/12/2022 URINE, CLEAN CATCH   Final   Special Requests 02/12/2022    Final                   Value:NONE Performed at [facility], [place] [address], [place], [place] [address]    Culture 02/12/2022 10,000 COLONIES/mL PROTEUS MIRABILIS (A)   Final   Report Status 02/12/2022 02/15/2022 FINAL   Final   Organism ID, Bacteria 02/12/2022 PROTEUS MIRABILIS (A)   Final   Glucose-Capillary 02/12/2022 128 (H)  70 - 99 mg/dL Final   Glucose reference range applies only to samples taken after fasting for at least 8 hours.   Glucose-Capillary 02/12/2022 196 (H)  70 - 99 mg/dL Final   Glucose reference range applies only to samples taken after fasting for at least 8 hours.   Glucose-Capillary 02/13/2022 168 (H)  70 - 99 mg/dL Final   Glucose reference range applies only to samples taken after fasting for at least 8 hours.   Glucose-Capillary 02/13/2022 153 (H)  70 - 99 mg/dL Final   Glucose reference range applies only to samples taken after fasting for at least 8 hours.   Glucose-Capillary 02/13/2022 134 (H)  70 - 99 mg/dL Final   Glucose reference range applies only to samples taken after fasting for at least 8 hours.   Glucose-Capillary 02/13/2022 178 (H)  70 - 99 mg/dL Final   Glucose reference range applies only to samples taken after fasting for at least 8 hours.   Glucose-Capillary 02/14/2022 156 (H)  70 - 99 mg/dL Final   Glucose reference range applies only to samples taken after fasting for at least 8 hours.   Glucose-Capillary  02/14/2022 169 (H)  70 - 99 mg/dL Final   Glucose reference range applies only to samples taken after fasting for at least 8 hours.   Glucose-Capillary 02/14/2022 156 (H)  70 - 99 mg/dL Final   Glucose reference range applies only to samples taken after fasting for at least 8 hours.   Glucose-Capillary 02/14/2022 148 (H)  70 - 99 mg/dL Final   Glucose reference range applies only to samples taken after fasting for at least 8 hours.   Glucose-Capillary 02/15/2022 159 (H)  70 - 99 mg/dL Final   Glucose reference range applies only to samples taken after fasting for at least 8 hours.   Glucose-Capillary 02/15/2022 125 (H)  70 - 99 mg/dL Final   Glucose reference range applies only to samples taken after fasting for at least 8 hours.   Glucose-Capillary 02/15/2022 142 (H)  70 - 99 mg/dL Final   Glucose reference range applies only to samples taken after fasting for at least 8 hours.   Glucose-Capillary 02/15/2022 128 (H)  70 - 99 mg/dL Final   Glucose reference range applies only to samples taken after fasting for at least 8 hours.   Glucose-Capillary 02/16/2022 137 (H)  70 - 99 mg/dL Final  Glucose reference range applies only to samples taken after fasting for at least 8 hours.   WBC 02/16/2022 6.0  4.0 - 10.5 K/uL Final   RBC 02/16/2022 4.48  3.87 - 5.11 MIL/uL Final   Hemoglobin 02/16/2022 10.7 (L)  12.0 - 15.0 g/dL Final   HCT 02/16/2022 35.4 (L)  36.0 - 46.0 % Final   MCV 02/16/2022 79.0 (L)  80.0 - 100.0 fL Final   MCH 02/16/2022 23.9 (L)  26.0 - 34.0 pg Final   MCHC 02/16/2022 30.2  30.0 - 36.0 g/dL Final   RDW 02/16/2022 17.0 (H)  11.5 - 15.5 % Final   Platelets 02/16/2022 387  150 - 400 K/uL Final   nRBC 02/16/2022 0.0  0.0 - 0.2 % Final   Neutrophils Relative % 02/16/2022 54  % Final   Neutro Abs 02/16/2022 3.2  1.7 - 7.7 K/uL Final   Lymphocytes Relative 02/16/2022 35  % Final   Lymphs Abs 02/16/2022 2.1  0.7 - 4.0 K/uL Final   Monocytes Relative 02/16/2022 6  % Final    Monocytes Absolute 02/16/2022 0.4  0.1 - 1.0 K/uL Final   Eosinophils Relative 02/16/2022 2  % Final   Eosinophils Absolute 02/16/2022 0.1  0.0 - 0.5 K/uL Final   Basophils Relative 02/16/2022 2  % Final   Basophils Absolute 02/16/2022 0.1  0.0 - 0.1 K/uL Final   Immature Granulocytes 02/16/2022 1  % Final   Abs Immature Granulocytes 02/16/2022 0.04  0.00 - 0.07 K/uL Final   Performed at [facility], [place] [address], [place], [place] [address]   Glucose-Capillary 02/16/2022 131 (H)  70 - 99 mg/dL Final   Glucose reference range applies only to samples taken after fasting for at least 8 hours.   Glucose-Capillary 02/16/2022 123 (H)  70 - 99 mg/dL Final   Glucose reference range applies only to samples taken after fasting for at least 8 hours.   Glucose-Capillary 02/16/2022 195 (H)  70 - 99 mg/dL Final   Glucose reference range applies only to samples taken after fasting for at least 8 hours.   Glucose-Capillary 02/17/2022 148 (H)  70 - 99 mg/dL Final   Glucose reference range applies only to samples taken after fasting for at least 8 hours.  Admission on 02/08/2022, Discharged on 02/09/2022  Component Date Value Ref Range Status   B Natriuretic Peptide 02/08/2022 40.2  0.0 - 100.0 pg/mL Final   Performed at [facility], [address], [place], [place] [address]   Troponin I (High Sensitivity) 02/08/2022 4  <18 ng/L Final   Comment: (NOTE) Elevated high sensitivity troponin I (hsTnI) values and significant  changes across serial measurements may suggest ACS but many other  chronic and acute conditions are known to elevate hsTnI results.  Refer to the "Links" section for chest pain algorithms and additional  guidance. Performed at [facility], [place] [address], [place], [place] [address]    WBC 02/08/2022 8.5  4.0 - 10.5 K/uL Final   RBC 02/08/2022 4.30  3.87 - 5.11 MIL/uL Final   Hemoglobin 02/08/2022 10.7 (L)  12.0 - 15.0 g/dL Final   HCT 02/08/2022 33.1 (L)  36.0 -  46.0 % Final   MCV 02/08/2022 77.0 (L)  80.0 - 100.0 fL Final   MCH 02/08/2022 24.9 (L)  26.0 - 34.0 pg Final   MCHC 02/08/2022 32.3  30.0 - 36.0 g/dL Final   RDW 02/08/2022 16.5 (H)  11.5 - 15.5 %  Final   Platelets 02/08/2022 361  150 - 400 K/uL Final   nRBC 02/08/2022 0.0  0.0 - 0.2 % Final   Performed at [facility], [place] [address], [place], [place] [address]   Sodium 02/08/2022 131 (L)  135 - 145 mmol/L Final   Potassium 02/08/2022 4.4  3.5 - 5.1 mmol/L Final   Chloride 02/08/2022 96 (L)  98 - 111 mmol/L Final   CO2 02/08/2022 25  22 - 32 mmol/L Final   Glucose, Bld 02/08/2022 126 (H)  70 - 99 mg/dL Final   Glucose reference range applies only to samples taken after fasting for at least 8 hours.   BUN 02/08/2022 11  6 - 20 mg/dL Final   Creatinine, Ser 02/08/2022 0.52  0.44 - 1.00 mg/dL Final   Calcium 02/08/2022 9.4  8.9 - 10.3 mg/dL Final   Total Protein 02/08/2022 7.5  6.5 - 8.1 g/dL Final   Albumin 02/08/2022 3.6  3.5 - 5.0 g/dL Final   AST 02/08/2022 22  15 - 41 U/L Final   ALT 02/08/2022 20  0 - 44 U/L Final   Alkaline Phosphatase 02/08/2022 67  38 - 126 U/L Final   Total Bilirubin 02/08/2022 0.1 (L)  0.3 - 1.2 mg/dL Final   GFR, Estimated 02/08/2022 >60  >60 mL/min Final   Comment: (NOTE) Calculated using the CKD-EPI Creatinine Equation (2021)    Anion gap 02/08/2022 10  5 - 15 Final   Performed at [place] [address] [place], [place] [address]   Troponin I (High Sensitivity) 02/08/2022 5  <18 ng/L Final   Comment: (NOTE) Elevated high sensitivity troponin I (hsTnI) values and significant  changes across serial measurements may suggest ACS but many other  chronic and acute conditions are known to elevate hsTnI results.  Refer to the "Links" section for chest pain algorithms and additional  guidance. Performed at [facility], [place] [address] [place], [place] [address]    WBC 02/09/2022 7.3  4.0 - 10.5 K/uL Final   RBC 02/09/2022 4.70   3.87 - 5.11 MIL/uL Final   Hemoglobin 02/09/2022 11.4 (L)  12.0 - 15.0 g/dL Final   HCT 02/09/2022 36.6  36.0 - 46.0 % Final   MCV 02/09/2022 77.9 (L)  80.0 - 100.0 fL Final   MCH 02/09/2022 24.3 (L)  26.0 - 34.0 pg Final   MCHC 02/09/2022 31.1  30.0 - 36.0 g/dL Final   RDW 02/09/2022 16.6 (H)  11.5 - 15.5 % Final   Platelets 02/09/2022 403 (H)  150 - 400 K/uL Final   nRBC 02/09/2022 0.0  0.0 - 0.2 % Final   Performed at [facility] [address] [place],  [address]   Glucose-Capillary 02/08/2022 117 (H)  70 - 99 mg/dL Final   Glucose reference range applies only to samples taken after fasting for at least 8 hours.   Sodium 02/09/2022 138  135 - 145 mmol/L Final   Potassium 02/09/2022 3.7  3.5 - 5.1 mmol/L Final   Chloride 02/09/2022 103  98 - 111 mmol/L Final   CO2 02/09/2022 24  22 - 32 mmol/L Final   Glucose, Bld 02/09/2022 134 (H)  70 - 99 mg/dL Final   Glucose reference range applies only to samples taken after fasting for at least 8 hours.   BUN 02/09/2022 9  6 - 20 mg/dL Final   Creatinine, Ser 02/09/2022 0.44  0.44 - 1.00 mg/dL Final  Calcium 02/09/2022 8.3 (L)  8.9 - 10.3 mg/dL Final   GFR, Estimated 02/09/2022 >60  >60 mL/min Final   Comment: (NOTE) Calculated using the CKD-EPI Creatinine Equation (2021)    Anion gap 02/09/2022 11  5 - 15 Final   Performed at [facility], [place] [address], [place], [place] [address]   Iron 02/09/2022 20 (L)  28 - 170 ug/dL Final   TIBC 02/09/2022 455 (H)  250 - 450 ug/dL Final   Saturation Ratios 02/09/2022 4 (L)  10.4 - 31.8 % Final   UIBC 02/09/2022 435  ug/dL Final   Performed at [facility], [place] [address], [place], [place] [address]   Ferritin 02/09/2022 2 (L)  11 - 307 ng/mL Final   Performed at [place] [address] [place], [place] [address]   Weight 02/09/2022 2,846.58  oz Final   Height 02/09/2022 62  in Final   BP 02/09/2022 143/80  mmHg Final   WBC 02/09/2022 8.1  4.0 - 10.5 K/uL  Final   RBC 02/09/2022 4.38  3.87 - 5.11 MIL/uL Final   Hemoglobin 02/09/2022 10.9 (L)  12.0 - 15.0 g/dL Final   HCT 02/09/2022 33.3 (L)  36.0 - 46.0 % Final   MCV 02/09/2022 76.0 (L)  80.0 - 100.0 fL Final   MCH 02/09/2022 24.9 (L)  26.0 - 34.0 pg Final   MCHC 02/09/2022 32.7  30.0 - 36.0 g/dL Final   RDW 02/09/2022 16.5 (H)  11.5 - 15.5 % Final   Platelets 02/09/2022 397  150 - 400 K/uL Final   nRBC 02/09/2022 0.0  0.0 - 0.2 % Final   Neutrophils Relative % 02/09/2022 66  % Final   Neutro Abs 02/09/2022 5.4  1.7 - 7.7 K/uL Final   Lymphocytes Relative 02/09/2022 24  % Final   Lymphs Abs 02/09/2022 1.9  0.7 - 4.0 K/uL Final   Monocytes Relative 02/09/2022 8  % Final   Monocytes Absolute 02/09/2022 0.6  0.1 - 1.0 K/uL Final   Eosinophils Relative 02/09/2022 1  % Final   Eosinophils Absolute 02/09/2022 0.1  0.0 - 0.5 K/uL Final   Basophils Relative 02/09/2022 1  % Final   Basophils Absolute 02/09/2022 0.1  0.0 - 0.1 K/uL Final   Immature Granulocytes 02/09/2022 0  % Final   Abs Immature Granulocytes 02/09/2022 0.03  0.00 - 0.07 K/uL Final   Performed at [facility], [place] [address] [place], [place] [address]   Glucose-Capillary 02/09/2022 238 (H)  70 - 99 mg/dL Final   Glucose reference range applies only to samples taken after fasting for at least 8 hours.   Glucose-Capillary 02/09/2022 91  70 - 99 mg/dL Final   Glucose reference range applies only to samples taken after fasting for at least 8 hours.   CRP 02/09/2022 <0.5  <1.0 mg/dL Final   Performed at [place] [address], [place], [place] [address]   Sed Rate 02/09/2022 15  0 - 22 mm/hr Final   Performed at [facility] [address], [place], [place] [address]   Glucose-Capillary 02/09/2022 137 (H)  70 - 99 mg/dL Final   Glucose reference range applies only to samples taken after fasting for at least 8 hours.  Admission on 01/24/2022, Discharged on 02/08/2022  Component Date Value Ref Range Status    Glucose-Capillary 01/24/2022 143 (H)  70 - 99 mg/dL Final   Glucose reference range applies only to  samples taken after fasting for at least 8 hours.   Glucose-Capillary 01/24/2022 120 (H)  70 - 99 mg/dL Final   Glucose reference range applies only to samples taken after fasting for at least 8 hours.   Glucose-Capillary 01/25/2022 122 (H)  70 - 99 mg/dL Final   Glucose reference range applies only to samples taken after fasting for at least 8 hours.   Glucose-Capillary 01/25/2022 190 (H)  70 - 99 mg/dL Final   Glucose reference range applies only to samples taken after fasting for at least 8 hours.   Glucose-Capillary 01/25/2022 163 (H)  70 - 99 mg/dL Final   Glucose reference range applies only to samples taken after fasting for at least 8 hours.   WBC 01/26/2022 6.5  4.0 - 10.5 K/uL Final   RBC 01/26/2022 4.53  3.87 - 5.11 MIL/uL Final   Hemoglobin 01/26/2022 11.3 (L)  12.0 - 15.0 g/dL Final   HCT 01/26/2022 36.4  36.0 - 46.0 % Final   MCV 01/26/2022 80.4  80.0 - 100.0 fL Final   MCH 01/26/2022 24.9 (L)  26.0 - 34.0 pg Final   MCHC 01/26/2022 31.0  30.0 - 36.0 g/dL Final   RDW 01/26/2022 17.3 (H)  11.5 - 15.5 % Final   Platelets 01/26/2022 327  150 - 400 K/uL Final   nRBC 01/26/2022 0.0  0.0 - 0.2 % Final   Neutrophils Relative % 01/26/2022 60  % Final   Neutro Abs 01/26/2022 3.9  1.7 - 7.7 K/uL Final   Lymphocytes Relative 01/26/2022 31  % Final   Lymphs Abs 01/26/2022 2.0  0.7 - 4.0 K/uL Final   Monocytes Relative 01/26/2022 7  % Final   Monocytes Absolute 01/26/2022 0.5  0.1 - 1.0 K/uL Final   Eosinophils Relative 01/26/2022 1  % Final   Eosinophils Absolute 01/26/2022 0.1  0.0 - 0.5 K/uL Final   Basophils Relative 01/26/2022 1  % Final   Basophils Absolute 01/26/2022 0.1  0.0 - 0.1 K/uL Final   Immature Granulocytes 01/26/2022 0  % Final   Abs Immature Granulocytes 01/26/2022 0.02  0.00 - 0.07 K/uL Final   Performed at [facility], [place] [address], [place], [place]  [address]   Glucose-Capillary 01/26/2022 149 (H)  70 - 99 mg/dL Final   Glucose reference range applies only to samples taken after fasting for at least 8 hours.   Glucose-Capillary 01/26/2022 130 (H)  70 - 99 mg/dL Final   Glucose reference range applies only to samples taken after fasting for at least 8 hours.   Glucose-Capillary 01/26/2022 151 (H)  70 - 99 mg/dL Final   Glucose reference range applies only to samples taken after fasting for at least 8 hours.   Glucose-Capillary 01/26/2022 170 (H)  70 - 99 mg/dL Final   Glucose reference range applies only to samples taken after fasting for at least 8 hours.   Glucose-Capillary 01/27/2022 129 (H)  70 - 99 mg/dL Final   Glucose reference range applies only to samples taken after fasting for at least 8 hours.   Glucose-Capillary 01/27/2022 101 (H)  70 - 99 mg/dL Final   Glucose reference range applies only to samples taken after fasting for at least 8 hours.   Glucose-Capillary 01/27/2022 220 (H)  70 - 99 mg/dL Final   Glucose reference range applies only to samples taken after fasting for at least 8 hours.   Glucose-Capillary 01/27/2022 154 (H)  70 - 99 mg/dL Final   Glucose  reference range applies only to samples taken after fasting for at least 8 hours.   Glucose-Capillary 01/27/2022 163 (H)  70 - 99 mg/dL Final   Glucose reference range applies only to samples taken after fasting for at least 8 hours.   Glucose-Capillary 01/28/2022 127 (H)  70 - 99 mg/dL Final   Glucose reference range applies only to samples taken after fasting for at least 8 hours.   Glucose-Capillary 01/28/2022 110 (H)  70 - 99 mg/dL Final   Glucose reference range applies only to samples taken after fasting for at least 8 hours.   Glucose-Capillary 01/28/2022 167 (H)  70 - 99 mg/dL Final   Glucose reference range applies only to samples taken after fasting for at least 8 hours.   Glucose-Capillary 01/29/2022 145 (H)  70 - 99 mg/dL Final   Glucose reference range applies  only to samples taken after fasting for at least 8 hours.   Glucose-Capillary 01/29/2022 203 (H)  70 - 99 mg/dL Final   Glucose reference range applies only to samples taken after fasting for at least 8 hours.   Glucose-Capillary 01/29/2022 153 (H)  70 - 99 mg/dL Final   Glucose reference range applies only to samples taken after fasting for at least 8 hours.   Glucose-Capillary 01/29/2022 166 (H)  70 - 99 mg/dL Final   Glucose reference range applies only to samples taken after fasting for at least 8 hours.   Glucose-Capillary 01/30/2022 142 (H)  70 - 99 mg/dL Final   Glucose reference range applies only to samples taken after fasting for at least 8 hours.   Glucose-Capillary 01/30/2022 138 (H)  70 - 99 mg/dL Final   Glucose reference range applies only to samples taken after fasting for at least 8 hours.   Glucose-Capillary 01/30/2022 185 (H)  70 - 99 mg/dL Final   Glucose reference range applies only to samples taken after fasting for at least 8 hours.   Glucose-Capillary 01/30/2022 220 (H)  70 - 99 mg/dL Final   Glucose reference range applies only to samples taken after fasting for at least 8 hours.   Glucose-Capillary 01/31/2022 147 (H)  70 - 99 mg/dL Final   Glucose reference range applies only to samples taken after fasting for at least 8 hours.   Glucose-Capillary 01/31/2022 131 (H)  70 - 99 mg/dL Final   Glucose reference range applies only to samples taken after fasting for at least 8 hours.   Glucose-Capillary 01/31/2022 169 (H)  70 - 99 mg/dL Final   Glucose reference range applies only to samples taken after fasting for at least 8 hours.   Glucose-Capillary 01/31/2022 144 (H)  70 - 99 mg/dL Final   Glucose reference range applies only to samples taken after fasting for at least 8 hours.   Comment 1 01/31/2022 RN AWARE   Final   Glucose-Capillary 02/01/2022 117 (H)  70 - 99 mg/dL Final   Glucose reference range applies only to samples taken after fasting for at least 8 hours.    Glucose-Capillary 02/01/2022 180 (H)  70 - 99 mg/dL Final   Glucose reference range applies only to samples taken after fasting for at least 8 hours.   Glucose-Capillary 02/01/2022 209 (H)  70 - 99 mg/dL Final   Glucose reference range applies only to samples taken after fasting for at least 8 hours.   Glucose-Capillary 02/02/2022 131 (H)  70 - 99 mg/dL Final   Glucose reference range applies only to samples taken after  fasting for at least 8 hours.   WBC 02/02/2022 7.5  4.0 - 10.5 K/uL Final   RBC 02/02/2022 4.52  3.87 - 5.11 MIL/uL Final   Hemoglobin 02/02/2022 11.3 (L)  12.0 - 15.0 g/dL Final   HCT 02/02/2022 35.0 (L)  36.0 - 46.0 % Final   MCV 02/02/2022 77.4 (L)  80.0 - 100.0 fL Final   MCH 02/02/2022 25.0 (L)  26.0 - 34.0 pg Final   MCHC 02/02/2022 32.3  30.0 - 36.0 g/dL Final   RDW 02/02/2022 16.7 (H)  11.5 - 15.5 % Final   Platelets 02/02/2022 345  150 - 400 K/uL Final   nRBC 02/02/2022 0.0  0.0 - 0.2 % Final   Neutrophils Relative % 02/02/2022 63  % Final   Neutro Abs 02/02/2022 4.7  1.7 - 7.7 K/uL Final   Lymphocytes Relative 02/02/2022 28  % Final   Lymphs Abs 02/02/2022 2.1  0.7 - 4.0 K/uL Final   Monocytes Relative 02/02/2022 7  % Final   Monocytes Absolute 02/02/2022 0.5  0.1 - 1.0 K/uL Final   Eosinophils Relative 02/02/2022 1  % Final   Eosinophils Absolute 02/02/2022 0.1  0.0 - 0.5 K/uL Final   Basophils Relative 02/02/2022 1  % Final   Basophils Absolute 02/02/2022 0.1  0.0 - 0.1 K/uL Final   Immature Granulocytes 02/02/2022 0  % Final   Abs Immature Granulocytes 02/02/2022 0.03  0.00 - 0.07 K/uL Final   Performed at [facility], [place] [address] [place], [place] [address]   Glucose-Capillary 02/02/2022 95  70 - 99 mg/dL Final   Glucose reference range applies only to samples taken after fasting for at least 8 hours.   Glucose-Capillary 02/02/2022 120 (H)  70 - 99 mg/dL Final   Glucose reference range applies only to samples taken after fasting for at least 8  hours.   Glucose-Capillary 02/02/2022 191 (H)  70 - 99 mg/dL Final   Glucose reference range applies only to samples taken after fasting for at least 8 hours.   Glucose-Capillary 02/03/2022 148 (H)  70 - 99 mg/dL Final   Glucose reference range applies only to samples taken after fasting for at least 8 hours.   Glucose-Capillary 02/03/2022 122 (H)  70 - 99 mg/dL Final   Glucose reference range applies only to samples taken after fasting for at least 8 hours.   Glucose-Capillary 02/03/2022 233 (H)  70 - 99 mg/dL Final   Glucose reference range applies only to samples taken after fasting for at least 8 hours.   Glucose-Capillary 02/04/2022 126 (H)  70 - 99 mg/dL Final   Glucose reference range applies only to samples taken after fasting for at least 8 hours.   Glucose-Capillary 02/04/2022 117 (H)  70 - 99 mg/dL Final   Glucose reference range applies only to samples taken after fasting for at least 8 hours.   Glucose-Capillary 02/04/2022 135 (H)  70 - 99 mg/dL Final   Glucose reference range applies only to samples taken after fasting for at least 8 hours.   Glucose-Capillary 02/04/2022 197 (H)  70 - 99 mg/dL Final   Glucose reference range applies only to samples taken after fasting for at least 8 hours.   Glucose-Capillary 02/05/2022 170 (H)  70 - 99 mg/dL Final   Glucose reference range applies only to samples taken after fasting for at least 8 hours.   Glucose-Capillary 02/05/2022 107 (H)  70 - 99 mg/dL Final   Glucose reference range  applies only to samples taken after fasting for at least 8 hours.   Glucose-Capillary 02/05/2022 184 (H)  70 - 99 mg/dL Final   Glucose reference range applies only to samples taken after fasting for at least 8 hours.   Glucose-Capillary 02/05/2022 256 (H)  70 - 99 mg/dL Final   Glucose reference range applies only to samples taken after fasting for at least 8 hours.   Glucose-Capillary 02/06/2022 144 (H)  70 - 99 mg/dL Final   Glucose reference range  applies only to samples taken after fasting for at least 8 hours.   Glucose-Capillary 02/06/2022 190 (H)  70 - 99 mg/dL Final   Glucose reference range applies only to samples taken after fasting for at least 8 hours.   Glucose-Capillary 02/06/2022 92  70 - 99 mg/dL Final   Glucose reference range applies only to samples taken after fasting for at least 8 hours.   Trichomonas 02/06/2022 Negative   Final   Bacterial Vaginitis-Urine 02/06/2022 Negative   Final   Molecular Comment 02/06/2022 For tests bacteria and/or candida, this specimen does not meet the   Final   Molecular Comment 02/06/2022 strict criteria set by the FDA. The result interpretation should be   Final   Molecular Comment 02/06/2022 considered in conjunction with the patient's clinical history.   Final   Comment 02/06/2022 Normal Reference Range Trichomonas - Negative   Final   Glucose-Capillary 02/06/2022 197 (H)  70 - 99 mg/dL Final   Glucose reference range applies only to samples taken after fasting for at least 8 hours.   Glucose-Capillary 02/07/2022 129 (H)  70 - 99 mg/dL Final   Glucose reference range applies only to samples taken after fasting for at least 8 hours.   Glucose-Capillary 02/07/2022 207 (H)  70 - 99 mg/dL Final   Glucose reference range applies only to samples taken after fasting for at least 8 hours.   Glucose-Capillary 02/07/2022 153 (H)  70 - 99 mg/dL Final   Glucose reference range applies only to samples taken after fasting for at least 8 hours.   Glucose-Capillary 02/08/2022 129 (H)  70 - 99 mg/dL Final   Glucose reference range applies only to samples taken after fasting for at least 8 hours.   Glucose-Capillary 02/08/2022 107 (H)  70 - 99 mg/dL Final   Glucose reference range applies only to samples taken after fasting for at least 8 hours.  Admission on 01/23/2022, Discharged on 01/24/2022  Component Date Value Ref Range Status   Sodium 01/23/2022 140  135 - 145 mmol/L Final   Potassium  01/23/2022 4.4  3.5 - 5.1 mmol/L Final   Chloride 01/23/2022 101  98 - 111 mmol/L Final   CO2 01/23/2022 25  22 - 32 mmol/L Final   Glucose, Bld 01/23/2022 198 (H)  70 - 99 mg/dL Final   Glucose reference range applies only to samples taken after fasting for at least 8 hours.   BUN 01/23/2022 13  6 - 20 mg/dL Final   Creatinine, Ser 01/23/2022 0.62  0.44 - 1.00 mg/dL Final   Calcium 01/23/2022 9.3  8.9 - 10.3 mg/dL Final   GFR, Estimated 01/23/2022 >60  >60 mL/min Final   Comment: (NOTE) Calculated using the CKD-EPI Creatinine Equation (2021)    Anion gap 01/23/2022 14  5 - 15 Final   Performed at [facility], [place] [address], [place], [place] [address]   WBC 01/23/2022 5.8  4.0 - 10.5 K/uL Final   RBC  01/23/2022 4.63  3.87 - 5.11 MIL/uL Final   Hemoglobin 01/23/2022 11.7 (L)  12.0 - 15.0 g/dL Final   HCT 01/23/2022 37.4  36.0 - 46.0 % Final   MCV 01/23/2022 80.8  80.0 - 100.0 fL Final   MCH 01/23/2022 25.3 (L)  26.0 - 34.0 pg Final   MCHC 01/23/2022 31.3  30.0 - 36.0 g/dL Final   RDW 01/23/2022 17.9 (H)  11.5 - 15.5 % Final   Platelets 01/23/2022 333  150 - 400 K/uL Final   nRBC 01/23/2022 0.0  0.0 - 0.2 % Final   Performed at [facility], [place] [address], [place], [place] [address]   Troponin I (High Sensitivity) 01/23/2022 6  <18 ng/L Final   Comment: (NOTE) Elevated high sensitivity troponin I (hsTnI) values and significant  changes across serial measurements may suggest ACS but many other  chronic and acute conditions are known to elevate hsTnI results.  Refer to the "Links" section for chest pain algorithms and additional  guidance. Performed at [facility], [place] [address], [place], [place] [address]    Troponin I (High Sensitivity) 01/23/2022 5  <18 ng/L Final   Comment: (NOTE) Elevated high sensitivity troponin I (hsTnI) values and significant  changes across serial measurements may suggest ACS but many other  chronic and acute conditions are known to  elevate hsTnI results.  Refer to the "Links" section for chest pain algorithms and additional  guidance. Performed at [facility], [place] [address] [place], [place] [address]    SARS Coronavirus 2 by RT PCR 01/23/2022 NEGATIVE  NEGATIVE Final   Comment: (NOTE) SARS-CoV-2 target nucleic acids are NOT DETECTED.  The SARS-CoV-2 RNA is generally detectable in upper and lower respiratory specimens during the acute phase of infection. The lowest concentration of SARS-CoV-2 viral copies this assay can detect is 250 copies / mL. A negative result does not preclude SARS-CoV-2 infection and should not be used as the sole basis for treatment or other patient management decisions.  A negative result may occur with improper specimen collection / handling, submission of specimen other than nasopharyngeal swab, presence of viral mutation(s) within the areas targeted by this assay, and inadequate number of viral copies (<250 copies / mL). A negative result must be combined with clinical observations, patient history, and epidemiological information.  Fact Sheet for Patients:   https://www.patel.info/  Fact Sheet for Healthcare Providers: https://hall.com/  This test is not yet approved or                           cleared by the [place] FDA and has been authorized for detection and/or diagnosis of SARS-CoV-2 by FDA under an Emergency Use Authorization (EUA).  This EUA will remain in effect (meaning this test can be used) for the duration of the COVID-19 declaration under Section 564(b)(1) of the Act, 21 U.S.C. section 360bbb-3(b)(1), unless the authorization is terminated or revoked sooner.  Performed at [facility], [place] [address], [place], [place] [address]    B Natriuretic Peptide 01/23/2022 41.3  0.0 - 100.0 pg/mL Final   Performed at [place] [address], [place], [place] [address]   Group A Strep by PCR  01/23/2022 NOT DETECTED  NOT DETECTED Final   Performed at [facility], [place] [address], [place],  [address]   Color, Urine 01/23/2022 YELLOW  YELLOW Final   APPearance 01/23/2022 CLEAR  CLEAR Final   Specific Gravity, Urine 01/23/2022 1.018  1.005 - 1.030 Final   pH 01/23/2022 7.0  5.0 - 8.0 Final   Glucose, UA 01/23/2022 NEGATIVE  NEGATIVE mg/dL Final   Hgb urine dipstick 01/23/2022 NEGATIVE  NEGATIVE Final   Bilirubin Urine 01/23/2022 NEGATIVE  NEGATIVE Final   Ketones, ur 01/23/2022 NEGATIVE  NEGATIVE mg/dL Final   Protein, ur 01/23/2022 NEGATIVE  NEGATIVE mg/dL Final   Nitrite 01/23/2022 NEGATIVE  NEGATIVE Final   Leukocytes,Ua 01/23/2022 TRACE (A)  NEGATIVE Final   RBC / HPF 01/23/2022 0-5  0 - 5 RBC/hpf Final   WBC, UA 01/23/2022 0-5  0 - 5 WBC/hpf Final   Bacteria, UA 01/23/2022 NONE SEEN  NONE SEEN Final   Squamous Epithelial / HPF 01/23/2022 0-5  0 - 5 Final   Mucus 01/23/2022 PRESENT   Final   Performed at [facility], [place] [address] [place], [place] [address]   SARS Coronavirus 2 by RT PCR 01/23/2022 NEGATIVE  NEGATIVE Final   Comment: (NOTE) SARS-CoV-2 target nucleic acids are NOT DETECTED.  The SARS-CoV-2 RNA is generally detectable in upper respiratory specimens during the acute phase of infection. The lowest concentration of SARS-CoV-2 viral copies this assay can detect is 138 copies/mL. A negative result does not preclude SARS-Cov-2 infection and should not be used as the sole basis for treatment or other patient management decisions. A negative result may occur with  improper specimen collection/handling, submission of specimen other than nasopharyngeal swab, presence of viral mutation(s) within the areas targeted by this assay, and inadequate number of viral copies(<138 copies/mL). A negative result must be combined with clinical observations, patient history, and epidemiological information. The expected result is Negative.  Fact Sheet for  Patients:  EntrepreneurPulse.com.au  Fact Sheet for Healthcare Providers:  IncredibleEmployment.be  This test is no                          t yet approved or cleared by the [place] FDA and  has been authorized for detection and/or diagnosis of SARS-CoV-2 by FDA under an Emergency Use Authorization (EUA). This EUA will remain  in effect (meaning this test can be used) for the duration of the COVID-19 declaration under Section 564(b)(1) of the Act, 21 U.S.C.section 360bbb-3(b)(1), unless the authorization is terminated  or revoked sooner.       Influenza A by PCR 01/23/2022 NEGATIVE  NEGATIVE Final   Influenza B by PCR 01/23/2022 NEGATIVE  NEGATIVE Final   Comment: (NOTE) The Xpert Xpress SARS-CoV-2/FLU/RSV plus assay is intended as an aid in the diagnosis of influenza from Nasopharyngeal swab specimens and should not be used as a sole basis for treatment. Nasal washings and aspirates are unacceptable for Xpert Xpress SARS-CoV-2/FLU/RSV testing.  Fact Sheet for Patients: EntrepreneurPulse.com.au  Fact Sheet for Healthcare Providers: IncredibleEmployment.be  This test is not yet approved or cleared by the [place] FDA and has been authorized for detection and/or diagnosis of SARS-CoV-2 by FDA under an Emergency Use Authorization (EUA). This EUA will remain in effect (meaning this test can be used) for the duration of the COVID-19 declaration under Section 564(b)(1) of the Act, 21 U.S.C. section 360bbb-3(b)(1), unless the authorization is terminated or revoked.  Performed at [facility], [place] [address], [place], [place] [address]    WBC 01/24/2022 6.1  4.0 - 10.5 K/uL Final   RBC 01/24/2022 4.60  3.87 - 5.11 MIL/uL Final  Hemoglobin 01/24/2022 11.7 (L)  12.0 - 15.0 g/dL Final   HCT 01/24/2022 37.0  36.0 - 46.0 % Final   MCV 01/24/2022 80.4  80.0 - 100.0 fL Final   MCH 01/24/2022 25.4  (L)  26.0 - 34.0 pg Final   MCHC 01/24/2022 31.6  30.0 - 36.0 g/dL Final   RDW 01/24/2022 17.6 (H)  11.5 - 15.5 % Final   Platelets 01/24/2022 334  150 - 400 K/uL Final   nRBC 01/24/2022 0.0  0.0 - 0.2 % Final   Neutrophils Relative % 01/24/2022 53  % Final   Neutro Abs 01/24/2022 3.3  1.7 - 7.7 K/uL Final   Lymphocytes Relative 01/24/2022 33  % Final   Lymphs Abs 01/24/2022 2.0  0.7 - 4.0 K/uL Final   Monocytes Relative 01/24/2022 11  % Final   Monocytes Absolute 01/24/2022 0.7  0.1 - 1.0 K/uL Final   Eosinophils Relative 01/24/2022 2  % Final   Eosinophils Absolute 01/24/2022 0.1  0.0 - 0.5 K/uL Final   Basophils Relative 01/24/2022 1  % Final   Basophils Absolute 01/24/2022 0.1  0.0 - 0.1 K/uL Final   Immature Granulocytes 01/24/2022 0  % Final   Abs Immature Granulocytes 01/24/2022 0.02  0.00 - 0.07 K/uL Final   Performed at [facility], [place] [address], [place], [place] [address]   Glucose-Capillary 01/24/2022 110 (H)  70 - 99 mg/dL Final   Glucose reference range applies only to samples taken after fasting for at least 8 hours.  No results displayed because visit has over 200 results.    Admission on 11/22/2021, Discharged on 11/22/2021  Component Date Value Ref Range Status   Glucose-Capillary 11/22/2021 169 (H)  70 - 99 mg/dL Final   Glucose reference range applies only to samples taken after fasting for at least 8 hours.  No results displayed because visit has over 200 results.    Admission on 10/04/2021, Discharged on 10/22/2021  Component Date Value Ref Range Status   SARS Coronavirus 2 by RT PCR 10/04/2021 NEGATIVE  NEGATIVE Final   Comment: (NOTE) SARS-CoV-2 target nucleic acids are NOT DETECTED.  The SARS-CoV-2 RNA is generally detectable in upper respiratory specimens during the acute phase of infection. The lowest concentration of SARS-CoV-2 viral copies this assay can detect is 138 copies/mL. A negative result does not preclude SARS-Cov-2 infection and  should not be used as the sole basis for treatment or other patient management decisions. A negative result may occur with  improper specimen collection/handling, submission of specimen other than nasopharyngeal swab, presence of viral mutation(s) within the areas targeted by this assay, and inadequate number of viral copies(<138 copies/mL). A negative result must be combined with clinical observations, patient history, and epidemiological information. The expected result is Negative.  Fact Sheet for Patients:  EntrepreneurPulse.com.au  Fact Sheet for Healthcare Providers:  IncredibleEmployment.be  This test is no                          t yet approved or cleared by the [place] FDA and  has been authorized for detection and/or diagnosis of SARS-CoV-2 by FDA under an Emergency Use Authorization (EUA). This EUA will remain  in effect (meaning this test can be used) for the duration of the COVID-19 declaration under Section 564(b)(1) of the Act, 21 U.S.C.section 360bbb-3(b)(1), unless the authorization is terminated  or revoked sooner.       Influenza A by PCR 10/04/2021  NEGATIVE  NEGATIVE Final   Influenza B by PCR 10/04/2021 NEGATIVE  NEGATIVE Final   Comment: (NOTE) The Xpert Xpress SARS-CoV-2/FLU/RSV plus assay is intended as an aid in the diagnosis of influenza from Nasopharyngeal swab specimens and should not be used as a sole basis for treatment. Nasal washings and aspirates are unacceptable for Xpert Xpress SARS-CoV-2/FLU/RSV testing.  Fact Sheet for Patients: BloggerCourse.com  Fact Sheet for Healthcare Providers: SeriousBroker.it  This test is not yet approved or cleared by the [place] FDA and has been authorized for detection and/or diagnosis of SARS-CoV-2 by FDA under an Emergency Use Authorization (EUA). This EUA will remain in effect (meaning this test can be used)  for the duration of the COVID-19 declaration under Section 564(b)(1) of the Act, 21 U.S.C. section 360bbb-3(b)(1), unless the authorization is terminated or revoked.  Performed at [facility], [address], [place], [place] [address]    WBC 10/04/2021 8.4  4.0 - 10.5 K/uL Final   RBC 10/04/2021 4.42  3.87 - 5.11 MIL/uL Final   Hemoglobin 10/04/2021 11.6 (L)  12.0 - 15.0 g/dL Final   HCT 40/11/2723 35.7 (L)  36.0 - 46.0 % Final   MCV 10/04/2021 80.8  80.0 - 100.0 fL Final   MCH 10/04/2021 26.2  26.0 - 34.0 pg Final   MCHC 10/04/2021 32.5  30.0 - 36.0 g/dL Final   RDW 36/64/4034 16.0 (H)  11.5 - 15.5 % Final   Platelets 10/04/2021 372  150 - 400 K/uL Final   nRBC 10/04/2021 0.0  0.0 - 0.2 % Final   Neutrophils Relative % 10/04/2021 68  % Final   Neutro Abs 10/04/2021 5.7  1.7 - 7.7 K/uL Final   Lymphocytes Relative 10/04/2021 22  % Final   Lymphs Abs 10/04/2021 1.8  0.7 - 4.0 K/uL Final   Monocytes Relative 10/04/2021 8  % Final   Monocytes Absolute 10/04/2021 0.7  0.1 - 1.0 K/uL Final   Eosinophils Relative 10/04/2021 1  % Final   Eosinophils Absolute 10/04/2021 0.1  0.0 - 0.5 K/uL Final   Basophils Relative 10/04/2021 1  % Final   Basophils Absolute 10/04/2021 0.1  0.0 - 0.1 K/uL Final   Immature Granulocytes 10/04/2021 0  % Final   Abs Immature Granulocytes 10/04/2021 0.03  0.00 - 0.07 K/uL Final   Performed at [facility], [address] [place], [place] [address]   Sodium 10/04/2021 136  135 - 145 mmol/L Final   Potassium 10/04/2021 4.2  3.5 - 5.1 mmol/L Final   Chloride 10/04/2021 104  98 - 111 mmol/L Final   CO2 10/04/2021 25  22 - 32 mmol/L Final   Glucose, Bld 10/04/2021 100 (H)  70 - 99 mg/dL Final   Glucose reference range applies only to samples taken after fasting for at least 8 hours.   BUN 10/04/2021 9  6 - 20 mg/dL Final   Creatinine, Ser 10/04/2021 0.52  0.44 - 1.00 mg/dL Final   Calcium 56/38/7564 9.0  8.9 - 10.3 mg/dL Final   Total Protein  10/04/2021 7.0  6.5 - 8.1 g/dL Final   Albumin 33/29/5188 3.2 (L)  3.5 - 5.0 g/dL Final   AST 41/66/0630 13 (L)  15 - 41 U/L Final   ALT 10/04/2021 10  0 - 44 U/L Final   Alkaline Phosphatase 10/04/2021 61  38 - 126 U/L Final   Total Bilirubin 10/04/2021 0.3  0.3 - 1.2 mg/dL Final   GFR, Estimated 10/04/2021 >60  >  60 mL/min Final   Comment: (NOTE) Calculated using the CKD-EPI Creatinine Equation (2021)    Anion gap 10/04/2021 7  5 - 15 Final   Performed at [facility], [place] [address], [place], [place] [address]   Hgb A1c MFr Bld 10/04/2021 7.0 (H)  4.8 - 5.6 % Final   Comment: (NOTE) Pre diabetes:          5.7%-6.4%  Diabetes:              >6.4%  Glycemic control for   <7.0% adults with diabetes    Mean Plasma Glucose 10/04/2021 154.2  mg/dL Final   Performed at [facility], [place] [address] [place], [place] [address]   Cholesterol 10/04/2021 178  0 - 200 mg/dL Final   Triglycerides 10/04/2021 155 (H)  <150 mg/dL Final   HDL 10/04/2021 52  >40 mg/dL Final   Total CHOL/HDL Ratio 10/04/2021 3.4  RATIO Final   VLDL 10/04/2021 31  0 - 40 mg/dL Final   LDL Cholesterol 10/04/2021 95  0 - 99 mg/dL Final   Comment:        Total Cholesterol/HDL:CHD Risk Coronary Heart Disease Risk Table                     Men   Women  1/2 Average Risk   3.4   3.3  Average Risk       5.0   4.4  2 X Average Risk   9.6   7.1  3 X Average Risk  23.4   11.0        Use the calculated Patient Ratio above and the CHD Risk Table to determine the patient's CHD Risk.        ATP III CLASSIFICATION (LDL):  <100     mg/dL   Optimal  100-129  mg/dL   Near or Above                    Optimal  130-159  mg/dL   Borderline  160-189  mg/dL   High  >190     mg/dL   Very High Performed at [place] [address], [place], [place] [address]    POC Amphetamine UR 10/04/2021 None Detected  NONE DETECTED (Cut Off Level 1000 ng/mL) Final   POC Secobarbital (BAR) 10/04/2021 None Detected  NONE  DETECTED (Cut Off Level 300 ng/mL) Final   POC Buprenorphine (BUP) 10/04/2021 None Detected  NONE DETECTED (Cut Off Level 10 ng/mL) Final   POC Oxazepam (BZO) 10/04/2021 None Detected  NONE DETECTED (Cut Off Level 300 ng/mL) Final   POC Cocaine UR 10/04/2021 None Detected  NONE DETECTED (Cut Off Level 300 ng/mL) Final   POC Methamphetamine UR 10/04/2021 None Detected  NONE DETECTED (Cut Off Level 1000 ng/mL) Final   POC Morphine 10/04/2021 None Detected  NONE DETECTED (Cut Off Level 300 ng/mL) Final   POC Methadone UR 10/04/2021 None Detected  NONE DETECTED (Cut Off Level 300 ng/mL) Final   POC Oxycodone UR 10/04/2021 Positive (A)  NONE DETECTED (Cut Off Level 100 ng/mL) Final   POC Marijuana UR 10/04/2021 None Detected  NONE DETECTED (Cut Off Level 50 ng/mL) Final   SARSCOV2ONAVIRUS 2 AG 10/04/2021 NEGATIVE  NEGATIVE Final   Comment: (NOTE) SARS-CoV-2 antigen NOT DETECTED.   Negative results are presumptive.  Negative results do not preclude SARS-CoV-2 infection and should not be used as the sole basis for treatment or  other patient management decisions, including infection  control decisions, particularly in the presence of clinical signs and  symptoms consistent with COVID-19, or in those who have been in contact with the virus.  Negative results must be combined with clinical observations, patient history, and epidemiological information. The expected result is Negative.  Fact Sheet for Patients: HandmadeRecipes.com.cy  Fact Sheet for Healthcare Providers: FuneralLife.at  This test is not yet approved or cleared by the [place] FDA and  has been authorized for detection and/or diagnosis of SARS-CoV-2 by FDA under an Emergency Use Authorization (EUA).  This EUA will remain in effect (meaning this test can be used) for the duration of  the COV                          ID-19 declaration under Section 564(b)(1) of the Act, 21 U.S.C.  section 360bbb-3(b)(1), unless the authorization is terminated or revoked sooner.     Valproic Acid Lvl 10/04/2021 51  50.0 - 100.0 ug/mL Final   Performed at [place] [address], [place], [place] [address]   Valproic Acid Lvl 10/08/2021 57  50.0 - 100.0 ug/mL Final   Performed at [place] [address] [place], [place] [address]   Glucose-Capillary 10/09/2021 123 (H)  70 - 99 mg/dL Final   Glucose reference range applies only to samples taken after fasting for at least 8 hours.  There may be more visits with results that are not included.    Blood Alcohol level:  Lab Results  Component Value Date   ETH <10 11/23/2021   ETH <10 0000000    Metabolic Disorder Labs: Lab Results  Component Value Date   HGBA1C 6.7 (H) 12/28/2021   MPG 145.59 12/28/2021   MPG 154.2 10/04/2021   Lab Results  Component Value Date   PROLACTIN 3.8 (L) 11/23/2021   Lab Results  Component Value Date   CHOL 171 11/23/2021   TRIG 125 11/23/2021   HDL 65 11/23/2021   CHOLHDL 2.6 11/23/2021   VLDL 25 11/23/2021   LDLCALC 81 11/23/2021   LDLCALC 95 10/04/2021    Therapeutic Lab Levels: No results found for: "LITHIUM" Lab Results  Component Value Date   VALPROATE 17 (L) 02/17/2022   VALPROATE 33 (L) 01/18/2022   No results found for: "CBMZ"  Physical Findings   PHQ2-9    [facility] from 11/23/2021 in [facility]  PHQ-2 Total Score 2  PHQ-9 Total Score 3      Flowsheet Row ED from 02/18/2022 in [facility] ED from 02/17/2022 in [facility] from 02/09/2022 in [facility] No Risk No Risk No Risk        Musculoskeletal  Strength & Muscle Tone: within normal limits Gait & Station: normal Patient leans: N/A  Psychiatric Specialty Exam  Presentation  General Appearance:  Casual; Disheveled  Eye  Contact: Good  Speech: Clear and Coherent; Normal Rate  Speech Volume: Normal  Handedness: Right   Mood and Affect  Mood: Euthymic  Affect: Blunt   Thought Process  Thought Processes: Coherent; Goal Directed; Linear  Descriptions of Associations:Intact  Orientation:Full (Time, Place and Person)  Thought Content:Logical  Diagnosis of Schizophrenia or Schizoaffective disorder in past: Yes  Duration of Psychotic Symptoms: No data recorded  Hallucinations:Hallucinations: None  Ideas of Reference:None  Suicidal Thoughts:Suicidal Thoughts:  No  Homicidal Thoughts:Homicidal Thoughts: No   Sensorium  Memory: Immediate Good  Judgment: Intact  Insight: Present   Executive Functions  Concentration: Fair  Attention Span: Fair  Recall: [employer] of Knowledge: Fair  Language: Fair   Psychomotor Activity  Psychomotor Activity: Psychomotor Activity: Normal   Assets  Assets: Communication Skills; Desire for Improvement; Financial Resources/Insurance; Leisure Time; Resilience   Sleep  Sleep: Sleep: Good   Nutritional Assessment (For [facility]BS and [facility] admissions only) Has the patient had a weight loss or gain of 10 pounds or more in the last 3 months?: No Has the patient had a decrease in food intake/or appetite?: No Does the patient have dental problems?: No Does the patient have eating habits or behaviors that may be indicators of an eating disorder including binging or inducing vomiting?: No Has the patient recently lost weight without trying?: 0 Has the patient been eating poorly because of a decreased appetite?: 0 Malnutrition Screening Tool Score: 0    Physical Exam  Physical Exam Constitutional:      General: She is not in acute distress.    Appearance: She is not ill-appearing, toxic-appearing or diaphoretic.  Eyes:     General: No scleral icterus. Cardiovascular:     Rate and Rhythm: Tachycardia present.  Pulmonary:     Effort:  Pulmonary effort is normal. No respiratory distress.  Neurological:     Mental Status: She is alert and oriented to person, place, and time.  Psychiatric:        Attention and Perception: Attention and perception normal.        Mood and Affect: Mood normal. Affect is blunt.        Speech: Speech normal.        Behavior: Behavior normal. Behavior is cooperative.        Thought Content: Thought content normal.    Review of Systems  Constitutional:  Negative for chills and fever.  Respiratory:  Negative for shortness of breath.   Cardiovascular:  Negative for chest pain and palpitations.  Gastrointestinal:  Negative for abdominal pain.  Neurological:  Negative for headaches.   Blood pressure (!) 143/94, pulse (!) 117, temperature (!) 97.5 F (36.4 C), temperature source Oral, resp. rate 16, SpO2 94 %. There is no height or weight on file to calculate BMI.  Treatment Plan Summary: Plan Pt remains psychiatrically cleared. Pending discharge on 03/15/22 to [facility].  Tharon Aquas, NP 03/12/2022 8:40 AM

## 2022-03-12 NOTE — ED Notes (Signed)
Pt resting quietly breathing even and unlabored.  Staff will continue to monitor for safety.  

## 2022-03-12 NOTE — ED Notes (Signed)
Pt asleep at this hour. No apparent distress. RR even and unlabored. Monitored for safety.

## 2022-03-12 NOTE — Care Management (Signed)
OBS Care Management   Writer spoke to the [place] owner at [facility] and confirmed the time that the 1pm time that the patient will be picked up.   The patient will be discharged on (Monday) March 15, 2022 at [facility] will coordinate with the NP to ensure that the patient follow up prescription medication to [facility] in [place], [place].  Writer informed the RN working with the patient.

## 2022-03-12 NOTE — ED Notes (Signed)
Pt awake & conversing at present, no distress noted.  Monitoring for safety.

## 2022-03-12 NOTE — ED Notes (Signed)
Pt A&O x 4, calm & cooperative, no distress noted. Monitoring for safety.

## 2022-03-13 DIAGNOSIS — F209 Schizophrenia, unspecified: Secondary | ICD-10-CM | POA: Diagnosis not present

## 2022-03-13 LAB — GLUCOSE, CAPILLARY
Glucose-Capillary: 129 mg/dL — ABNORMAL HIGH (ref 70–99)
Glucose-Capillary: 149 mg/dL — ABNORMAL HIGH (ref 70–99)
Glucose-Capillary: 204 mg/dL — ABNORMAL HIGH (ref 70–99)
Glucose-Capillary: 205 mg/dL — ABNORMAL HIGH (ref 70–99)

## 2022-03-13 NOTE — ED Notes (Signed)
Patient accepted scheduled meds w/o difficulty. Breakfast provided. Currently sitting on pull out chair/bed in no distress. Safety maintained and will continue to monitor.

## 2022-03-13 NOTE — ED Notes (Signed)
Pt A&O x 4, no distress noted, calm & cooperative, pt interactive with milieu.  Denies SI, HI or AVH.  Monitoring for safety.

## 2022-03-13 NOTE — ED Provider Notes (Cosign Needed Addendum)
Behavioral Health Progress Note  Date and Time: 03/13/2022 9:54 AM Name: Teresa Murillo MRN:  [id number]  Subjective:  Teresa Murillo is a 62 y/o female with PMH of schizophrenia, borderline intellectual functioning, tobacco use d/o, HTN, HLD, DM2 (insulin-dependent), atrial flutter, COPD, admitted to [facility] on 11/23/21 voluntarily, accompanied by [employer] and reported that she was locked out of her group home. It was later confirmed that Teresa Murillo was dismissed from group home due to recurrent history of eloping from the group home. Teresa Murillo has been boarding at this facility until placement is secured.   Patient is seen face to face on the unit and chart reviewed. Alert and oriented x 4. Pleasant on approach. Patient is well groomed and appears to be well nourished. Has no sign of distress. Patient denies thoughts of self-harm. Denies hallucinations; does not appear to be responding to internal stimuli. Her thought process is clear and organized.  BP 128/77 Pulse 80  CBG 129. Patient reports that her appetite is good. Slept 8 hours last night.  Patient expressed excitement about upcoming discharge and motivated for self-improvement. She reports that "I want to stay away from alcohol, drugs  and I I don't want to smoke any more...".   Per nursing: patient slept well last night and has not expressed any concerns. He has remained pleasant and cooperative, taking medications as prescribed. She is excited about her new housing. Patient is to be discharged Monday 03/15/2022 to a group home.   Diagnosis:  Final diagnoses:  Schizophrenia, unspecified type (HCC)    Total Time spent with patient: 30 minutes  Past Psychiatric History: Schizophrenia, Intellectual Disability Past Medical History:  Past Medical History:  Diagnosis Date   Borderline intellectual functioning 12/14/2021   On 12/14/2021: Appreciate assistance from psychology consult. On the Wechsler Adult Intelligence Scale-4, Teresa Murillo achieved a  full-scale IQ score of 73 and a percentile rank of 4 placing her in the borderline range of intellectual functioning.    Chronic obstructive pulmonary disease (COPD) (HCC)    Glaucoma    Hyperlipidemia    Hypertension    Iron deficiency    Schizoaffective disorder (HCC)    Type 2 diabetes mellitus (HCC)     Past Surgical History:  Procedure Laterality Date   TUBAL LIGATION     Family History:  Family History  Problem Relation Age of Onset   Breast cancer Maternal Grandmother    Family Psychiatric  History: Unknown Social History:  Social History   Substance and Sexual Activity  Alcohol Use Not Currently     Social History   Substance and Sexual Activity  Drug Use Not Currently    Social History   Socioeconomic History   Marital status: Divorced    Spouse name: Not on file   Number of children: Not on file   Years of education: Not on file   Highest education level: Not on file  Occupational History   Not on file  Tobacco Use   Smoking status: Former    Packs/day: 1.00    Types: Cigars, Cigarettes    Quit date: 11/09/2021    Years since quitting: 0.3   Smokeless tobacco: Current  Vaping Use   Vaping Use: Never used  Substance and Sexual Activity   Alcohol use: Not Currently   Drug use: Not Currently   Sexual activity: Not Currently    Birth control/protection: Surgical  Other Topics Concern   Not on file  Social History Narrative   Not on file  Social Determinants of Health   Financial Resource Strain: Not on file  Food Insecurity: Not on file  Transportation Needs: Not on file  Physical Activity: Not on file  Stress: Not on file  Social Connections: Not on file   SDOH:  SDOH Screenings   Depression (PHQ2-9): Low Risk  (11/23/2021)  Tobacco Use: High Risk (03/08/2022)   Additional Social History:                         Sleep: Fair  Appetite:  Fair  Current Medications:  Current Facility-Administered Medications  Medication Dose  Route Frequency Provider Last Rate Last Admin   acetaminophen (TYLENOL) tablet 650 mg  650 mg Oral Q6H PRN Sindy Guadeloupe, NP   650 mg at 03/12/22 2115   albuterol (VENTOLIN HFA) 108 (90 Base) MCG/ACT inhaler 2 puff  2 puff Inhalation Q6H PRN Sindy Guadeloupe, NP   2 puff at 02/20/22 1944   alum & mag hydroxide-simeth (MAALOX/MYLANTA) 200-200-20 MG/5ML suspension 30 mL  30 mL Oral Q4H PRN Sindy Guadeloupe, NP   30 mL at 03/07/22 2155   apixaban (ELIQUIS) tablet 5 mg  5 mg Oral BID Sindy Guadeloupe, NP   5 mg at 03/13/22 2706   ARIPiprazole (ABILIFY) tablet 10 mg  10 mg Oral Daily Sindy Guadeloupe, NP   10 mg at 03/13/22 2376   cloZAPine (CLOZARIL) tablet 75 mg  75 mg Oral BID Park Pope, MD   75 mg at 03/13/22 2831   colchicine tablet 0.6 mg  0.6 mg Oral QHS Sindy Guadeloupe, NP   0.6 mg at 03/12/22 2114   diltiazem (CARDIZEM CD) 24 hr capsule 240 mg  240 mg Oral Daily Sindy Guadeloupe, NP   240 mg at 03/13/22 5176   divalproex (DEPAKOTE ER) 24 hr tablet 500 mg  500 mg Oral BID Sindy Guadeloupe, NP   500 mg at 03/13/22 1607   haloperidol (HALDOL) tablet 10 mg  10 mg Oral TID PRN Sindy Guadeloupe, NP       insulin aspart (novoLOG) injection 0-9 Units  0-9 Units Subcutaneous TID WC Park Pope, MD   1 Units at 03/13/22 0820   insulin glargine-yfgn (SEMGLEE) injection 12 Units  12 Units Subcutaneous Daily Sindy Guadeloupe, NP   12 Units at 03/13/22 0922   latanoprost (XALATAN) 0.005 % ophthalmic solution 1 drop  1 drop Both Eyes QHS Sindy Guadeloupe, NP   1 drop at 03/12/22 2115   magnesium hydroxide (MILK OF MAGNESIA) suspension 30 mL  30 mL Oral Daily PRN Sindy Guadeloupe, NP       metFORMIN (GLUCOPHAGE) tablet 500 mg  500 mg Oral BID WC Sindy Guadeloupe, NP   500 mg at 03/13/22 3710   mometasone-formoterol (DULERA) 200-5 MCG/ACT inhaler 2 puff  2 puff Inhalation BID Sindy Guadeloupe, NP   2 puff at 03/13/22 0820   multivitamins with iron tablet 1 tablet  1 tablet Oral QHS Princess Bruins, DO   1 tablet at 03/12/22 2114   nicotine (NICODERM  CQ - dosed in mg/24 hr) patch 7 mg  7 mg Transdermal Daily Sindy Guadeloupe, NP   7 mg at 03/13/22 6269   rosuvastatin (CRESTOR) tablet 20 mg  20 mg Oral QHS Princess Bruins, DO   20 mg at 03/12/22 2114   valbenazine (INGREZZA) capsule 40 mg  40 mg Oral q AM Sindy Guadeloupe, NP   40 mg at 03/13/22 0617   Vitamin D (Ergocalciferol) (DRISDOL) 1.25 MG (50000 UNIT)  capsule 50,000 Units  50,000 Units Oral Q Crista Curb, NP   50,000 Units at 03/11/22 1610   Current Outpatient Medications  Medication Sig Dispense Refill   Accu-Chek Softclix Lancets lancets Use as directed up to four times daily 100 each 0   apixaban (ELIQUIS) 5 MG TABS tablet Take 1 tablet (5 mg total) by mouth 2 (two) times daily. 60 tablet 0   ARIPiprazole (ABILIFY) 10 MG tablet Take 1 tablet (10 mg total) by mouth daily. 30 tablet 0   Blood Glucose Monitoring Suppl (ACCU-CHEK GUIDE) w/Device KIT Use as directed up to four times daily 1 kit 0   budesonide-formoterol (SYMBICORT) 160-4.5 MCG/ACT inhaler Inhale 2 puffs into the lungs in the morning and at bedtime.     Cholecalciferol (VITAMIN D3) 1.25 MG (50000 UT) CAPS Take 50,000 Units by mouth every Thursday.     clozapine (CLOZARIL) 50 MG tablet Take 1 tablet (50 mg total) by mouth daily. 30 tablet 0   colchicine 0.6 MG tablet Take 1 tablet (0.6 mg total) by mouth at bedtime. 30 tablet 0   diltiazem (CARDIZEM CD) 240 MG 24 hr capsule Take 1 capsule (240 mg total) by mouth daily. (Patient not taking: Reported on 11/24/2021) 30 capsule 0   divalproex (DEPAKOTE ER) 500 MG 24 hr tablet Take 1 tablet (500 mg total) by mouth 2 (two) times daily. 60 tablet 0   glucose blood test strip Use as directed up to four times daily 50 each 0   haloperidol (HALDOL) 10 MG tablet Take 1 tablet (10 mg total) by mouth 3 (three) times daily as needed for agitation (and psychotic symptoms).     INGREZZA 40 MG capsule Take 1 capsule (40 mg total) by mouth in the morning. 30 capsule 0   insulin aspart  (NOVOLOG) 100 UNIT/ML FlexPen Before each meal 3 times a day, 140-199 - 2 units, 200-250 - 4 units, 251-299 - 6 units,  300-349 - 8 units,  350 or above 10 units. Insulin PEN if approved, provide syringes and needles if needed.Please switch to any approved short acting Insulin if needed. 15 mL 0   insulin glargine (LANTUS) 100 UNIT/ML Solostar Pen Inject 12 Units into the skin daily. 15 mL 0   Insulin Pen Needle 32G X 4 MM MISC Use 4 times a day with insulin, 1 month supply. 100 each 0   latanoprost (XALATAN) 0.005 % ophthalmic solution Place 1 drop into both eyes at bedtime.     metFORMIN (GLUCOPHAGE) 500 MG tablet Take 500 mg by mouth 2 (two) times daily with a meal.     nicotine (NICODERM CQ - DOSED IN MG/24 HR) 7 mg/24hr patch Place 1 patch (7 mg total) onto the skin daily. 28 patch 0   PROAIR HFA 108 (90 Base) MCG/ACT inhaler Inhale 2 puffs into the lungs every 6 (six) hours as needed for wheezing or shortness of breath.      Labs  Lab Results:  Admission on 02/18/2022  Component Date Value Ref Range Status   Glucose-Capillary 02/18/2022 254 (H)  70 - 99 mg/dL Final   Glucose reference range applies only to samples taken after fasting for at least 8 hours.   Glucose-Capillary 02/18/2022 112 (H)  70 - 99 mg/dL Final   Glucose reference range applies only to samples taken after fasting for at least 8 hours.   Glucose-Capillary 02/19/2022 159 (H)  70 - 99 mg/dL Final   Glucose reference range applies only to samples taken  after fasting for at least 8 hours.   Glucose-Capillary 02/19/2022 160 (H)  70 - 99 mg/dL Final   Glucose reference range applies only to samples taken after fasting for at least 8 hours.   Glucose-Capillary 02/19/2022 177 (H)  70 - 99 mg/dL Final   Glucose reference range applies only to samples taken after fasting for at least 8 hours.   Glucose-Capillary 02/19/2022 147 (H)  70 - 99 mg/dL Final   Glucose reference range applies only to samples taken after fasting for at  least 8 hours.   Glucose-Capillary 02/20/2022 126 (H)  70 - 99 mg/dL Final   Glucose reference range applies only to samples taken after fasting for at least 8 hours.   Glucose-Capillary 02/20/2022 132 (H)  70 - 99 mg/dL Final   Glucose reference range applies only to samples taken after fasting for at least 8 hours.   Glucose-Capillary 02/20/2022 125 (H)  70 - 99 mg/dL Final   Glucose reference range applies only to samples taken after fasting for at least 8 hours.   Glucose-Capillary 02/21/2022 151 (H)  70 - 99 mg/dL Final   Glucose reference range applies only to samples taken after fasting for at least 8 hours.   Glucose-Capillary 02/21/2022 106 (H)  70 - 99 mg/dL Final   Glucose reference range applies only to samples taken after fasting for at least 8 hours.   Glucose-Capillary 02/21/2022 149 (H)  70 - 99 mg/dL Final   Glucose reference range applies only to samples taken after fasting for at least 8 hours.   Glucose-Capillary 02/22/2022 131 (H)  70 - 99 mg/dL Final   Glucose reference range applies only to samples taken after fasting for at least 8 hours.   Glucose-Capillary 02/22/2022 141 (H)  70 - 99 mg/dL Final   Glucose reference range applies only to samples taken after fasting for at least 8 hours.   Glucose-Capillary 02/22/2022 183 (H)  70 - 99 mg/dL Final   Glucose reference range applies only to samples taken after fasting for at least 8 hours.   Glucose-Capillary 02/23/2022 133 (H)  70 - 99 mg/dL Final   Glucose reference range applies only to samples taken after fasting for at least 8 hours.   WBC 02/23/2022 4.8  4.0 - 10.5 K/uL Final   RBC 02/23/2022 3.96  3.87 - 5.11 MIL/uL Final   Hemoglobin 02/23/2022 9.7 (L)  12.0 - 15.0 g/dL Final   HCT 16/10/960412/26/2023 30.6 (L)  36.0 - 46.0 % Final   MCV 02/23/2022 77.3 (L)  80.0 - 100.0 fL Final   MCH 02/23/2022 24.5 (L)  26.0 - 34.0 pg Final   MCHC 02/23/2022 31.7  30.0 - 36.0 g/dL Final   RDW 54/09/811912/26/2023 16.5 (H)  11.5 - 15.5 % Final    Platelets 02/23/2022 372  150 - 400 K/uL Final   nRBC 02/23/2022 0.0  0.0 - 0.2 % Final   Neutrophils Relative % 02/23/2022 56  % Final   Neutro Abs 02/23/2022 2.7  1.7 - 7.7 K/uL Final   Lymphocytes Relative 02/23/2022 32  % Final   Lymphs Abs 02/23/2022 1.5  0.7 - 4.0 K/uL Final   Monocytes Relative 02/23/2022 9  % Final   Monocytes Absolute 02/23/2022 0.4  0.1 - 1.0 K/uL Final   Eosinophils Relative 02/23/2022 2  % Final   Eosinophils Absolute 02/23/2022 0.1  0.0 - 0.5 K/uL Final   Basophils Relative 02/23/2022 1  % Final   Basophils Absolute 02/23/2022  0.1  0.0 - 0.1 K/uL Final   Immature Granulocytes 02/23/2022 0  % Final   Abs Immature Granulocytes 02/23/2022 0.01  0.00 - 0.07 K/uL Final   Performed at [facility], [address] [place], [place] [address]   Glucose-Capillary 02/23/2022 202 (H)  70 - 99 mg/dL Final   Glucose reference range applies only to samples taken after fasting for at least 8 hours.   Glucose-Capillary 02/23/2022 143 (H)  70 - 99 mg/dL Final   Glucose reference range applies only to samples taken after fasting for at least 8 hours.   Glucose-Capillary 02/23/2022 143 (H)  70 - 99 mg/dL Final   Glucose reference range applies only to samples taken after fasting for at least 8 hours.   Glucose-Capillary 02/24/2022 207 (H)  70 - 99 mg/dL Final   Glucose reference range applies only to samples taken after fasting for at least 8 hours.   Glucose-Capillary 02/24/2022 109 (H)  70 - 99 mg/dL Final   Glucose reference range applies only to samples taken after fasting for at least 8 hours.   Glucose-Capillary 02/24/2022 127 (H)  70 - 99 mg/dL Final   Glucose reference range applies only to samples taken after fasting for at least 8 hours.   Glucose-Capillary 02/24/2022 130 (H)  70 - 99 mg/dL Final   Glucose reference range applies only to samples taken after fasting for at least 8 hours.   Glucose-Capillary 02/25/2022 117 (H)  70 - 99 mg/dL Final   Glucose  reference range applies only to samples taken after fasting for at least 8 hours.   Glucose-Capillary 02/25/2022 104 (H)  70 - 99 mg/dL Final   Glucose reference range applies only to samples taken after fasting for at least 8 hours.   Glucose-Capillary 02/25/2022 180 (H)  70 - 99 mg/dL Final   Glucose reference range applies only to samples taken after fasting for at least 8 hours.   Glucose-Capillary 02/25/2022 180 (H)  70 - 99 mg/dL Final   Glucose reference range applies only to samples taken after fasting for at least 8 hours.   Glucose-Capillary 02/26/2022 133 (H)  70 - 99 mg/dL Final   Glucose reference range applies only to samples taken after fasting for at least 8 hours.   Glucose-Capillary 02/26/2022 85  70 - 99 mg/dL Final   Glucose reference range applies only to samples taken after fasting for at least 8 hours.   Glucose-Capillary 02/26/2022 136 (H)  70 - 99 mg/dL Final   Glucose reference range applies only to samples taken after fasting for at least 8 hours.   Glucose-Capillary 02/26/2022 170 (H)  70 - 99 mg/dL Final   Glucose reference range applies only to samples taken after fasting for at least 8 hours.   Glucose-Capillary 02/26/2022 185 (H)  70 - 99 mg/dL Final   Glucose reference range applies only to samples taken after fasting for at least 8 hours.   Glucose-Capillary 02/27/2022 124 (H)  70 - 99 mg/dL Final   Glucose reference range applies only to samples taken after fasting for at least 8 hours.   Glucose-Capillary 02/27/2022 161 (H)  70 - 99 mg/dL Final   Glucose reference range applies only to samples taken after fasting for at least 8 hours.   Glucose-Capillary 02/27/2022 170 (H)  70 - 99 mg/dL Final   Glucose reference range applies only to samples taken after fasting for at least 8 hours.   Glucose-Capillary 02/27/2022 154 (  H)  70 - 99 mg/dL Final   Glucose reference range applies only to samples taken after fasting for at least 8 hours.   Glucose-Capillary  02/28/2022 109 (H)  70 - 99 mg/dL Final   Glucose reference range applies only to samples taken after fasting for at least 8 hours.   Glucose-Capillary 02/28/2022 134 (H)  70 - 99 mg/dL Final   Glucose reference range applies only to samples taken after fasting for at least 8 hours.   Glucose-Capillary 02/28/2022 160 (H)  70 - 99 mg/dL Final   Glucose reference range applies only to samples taken after fasting for at least 8 hours.   Glucose-Capillary 02/28/2022 136 (H)  70 - 99 mg/dL Final   Glucose reference range applies only to samples taken after fasting for at least 8 hours.   Glucose-Capillary 02/28/2022 173 (H)  70 - 99 mg/dL Final   Glucose reference range applies only to samples taken after fasting for at least 8 hours.   Glucose-Capillary 03/01/2022 134 (H)  70 - 99 mg/dL Final   Glucose reference range applies only to samples taken after fasting for at least 8 hours.   Glucose-Capillary 03/01/2022 116 (H)  70 - 99 mg/dL Final   Glucose reference range applies only to samples taken after fasting for at least 8 hours.   Glucose-Capillary 03/01/2022 227 (H)  70 - 99 mg/dL Final   Glucose reference range applies only to samples taken after fasting for at least 8 hours.   Glucose-Capillary 03/01/2022 157 (H)  70 - 99 mg/dL Final   Glucose reference range applies only to samples taken after fasting for at least 8 hours.   Glucose-Capillary 03/02/2022 99  70 - 99 mg/dL Final   Glucose reference range applies only to samples taken after fasting for at least 8 hours.   WBC 03/02/2022 6.0  4.0 - 10.5 K/uL Final   RBC 03/02/2022 4.15  3.87 - 5.11 MIL/uL Final   Hemoglobin 03/02/2022 9.7 (L)  12.0 - 15.0 g/dL Final   HCT 16/11/9602 31.5 (L)  36.0 - 46.0 % Final   MCV 03/02/2022 75.9 (L)  80.0 - 100.0 fL Final   MCH 03/02/2022 23.4 (L)  26.0 - 34.0 pg Final   MCHC 03/02/2022 30.8  30.0 - 36.0 g/dL Final   RDW 54/10/8117 16.4 (H)  11.5 - 15.5 % Final   Platelets 03/02/2022 395  150 - 400  K/uL Final   nRBC 03/02/2022 0.0  0.0 - 0.2 % Final   Neutrophils Relative % 03/02/2022 61  % Final   Neutro Abs 03/02/2022 3.6  1.7 - 7.7 K/uL Final   Lymphocytes Relative 03/02/2022 29  % Final   Lymphs Abs 03/02/2022 1.7  0.7 - 4.0 K/uL Final   Monocytes Relative 03/02/2022 8  % Final   Monocytes Absolute 03/02/2022 0.5  0.1 - 1.0 K/uL Final   Eosinophils Relative 03/02/2022 1  % Final   Eosinophils Absolute 03/02/2022 0.1  0.0 - 0.5 K/uL Final   Basophils Relative 03/02/2022 1  % Final   Basophils Absolute 03/02/2022 0.1  0.0 - 0.1 K/uL Final   Immature Granulocytes 03/02/2022 0  % Final   Abs Immature Granulocytes 03/02/2022 0.01  0.00 - 0.07 K/uL Final   Performed at [facility], [address], [place], [place] [address]   Glucose-Capillary 03/02/2022 171 (H)  70 - 99 mg/dL Final   Glucose reference range applies only to samples taken after fasting for at least  8 hours.   Glucose-Capillary 03/02/2022 108 (H)  70 - 99 mg/dL Final   Glucose reference range applies only to samples taken after fasting for at least 8 hours.   Glucose-Capillary 03/02/2022 203 (H)  70 - 99 mg/dL Final   Glucose reference range applies only to samples taken after fasting for at least 8 hours.   Glucose-Capillary 03/03/2022 128 (H)  70 - 99 mg/dL Final   Glucose reference range applies only to samples taken after fasting for at least 8 hours.   Glucose-Capillary 03/03/2022 134 (H)  70 - 99 mg/dL Final   Glucose reference range applies only to samples taken after fasting for at least 8 hours.   Glucose-Capillary 03/03/2022 100 (H)  70 - 99 mg/dL Final   Glucose reference range applies only to samples taken after fasting for at least 8 hours.   Glucose-Capillary 03/03/2022 160 (H)  70 - 99 mg/dL Final   Glucose reference range applies only to samples taken after fasting for at least 8 hours.   Glucose-Capillary 03/04/2022 116 (H)  70 - 99 mg/dL Final   Glucose reference range applies only to samples  taken after fasting for at least 8 hours.   Glucose-Capillary 03/04/2022 104 (H)  70 - 99 mg/dL Final   Glucose reference range applies only to samples taken after fasting for at least 8 hours.   Glucose-Capillary 03/04/2022 156 (H)  70 - 99 mg/dL Final   Glucose reference range applies only to samples taken after fasting for at least 8 hours.   Glucose-Capillary 03/04/2022 201 (H)  70 - 99 mg/dL Final   Glucose reference range applies only to samples taken after fasting for at least 8 hours.   Glucose-Capillary 03/05/2022 120 (H)  70 - 99 mg/dL Final   Glucose reference range applies only to samples taken after fasting for at least 8 hours.   Glucose-Capillary 03/05/2022 237 (H)  70 - 99 mg/dL Final   Glucose reference range applies only to samples taken after fasting for at least 8 hours.   Glucose-Capillary 03/05/2022 168 (H)  70 - 99 mg/dL Final   Glucose reference range applies only to samples taken after fasting for at least 8 hours.   Glucose-Capillary 03/05/2022 200 (H)  70 - 99 mg/dL Final   Glucose reference range applies only to samples taken after fasting for at least 8 hours.   Glucose-Capillary 03/06/2022 135 (H)  70 - 99 mg/dL Final   Glucose reference range applies only to samples taken after fasting for at least 8 hours.   Glucose-Capillary 03/06/2022 135 (H)  70 - 99 mg/dL Final   Glucose reference range applies only to samples taken after fasting for at least 8 hours.   Glucose-Capillary 03/06/2022 235 (H)  70 - 99 mg/dL Final   Glucose reference range applies only to samples taken after fasting for at least 8 hours.   Glucose-Capillary 03/07/2022 141 (H)  70 - 99 mg/dL Final   Glucose reference range applies only to samples taken after fasting for at least 8 hours.   Glucose-Capillary 03/07/2022 157 (H)  70 - 99 mg/dL Final   Glucose reference range applies only to samples taken after fasting for at least 8 hours.   Glucose-Capillary 03/07/2022 199 (H)  70 - 99 mg/dL  Final   Glucose reference range applies only to samples taken after fasting for at least 8 hours.   Glucose-Capillary 03/07/2022 241 (H)  70 - 99 mg/dL Final   Glucose reference  range applies only to samples taken after fasting for at least 8 hours.   Glucose-Capillary 03/08/2022 123 (H)  70 - 99 mg/dL Final   Glucose reference range applies only to samples taken after fasting for at least 8 hours.   Glucose-Capillary 03/06/2022 155 (H)  70 - 99 mg/dL Final   Glucose reference range applies only to samples taken after fasting for at least 8 hours.   Glucose-Capillary 03/08/2022 197 (H)  70 - 99 mg/dL Final   Glucose reference range applies only to samples taken after fasting for at least 8 hours.   Glucose-Capillary 03/08/2022 158 (H)  70 - 99 mg/dL Final   Glucose reference range applies only to samples taken after fasting for at least 8 hours.   Glucose-Capillary 03/09/2022 118 (H)  70 - 99 mg/dL Final   Glucose reference range applies only to samples taken after fasting for at least 8 hours.   Glucose-Capillary 03/09/2022 142 (H)  70 - 99 mg/dL Final   Glucose reference range applies only to samples taken after fasting for at least 8 hours.   Glucose-Capillary 03/09/2022 243 (H)  70 - 99 mg/dL Final   Glucose reference range applies only to samples taken after fasting for at least 8 hours.   Glucose-Capillary 03/09/2022 171 (H)  70 - 99 mg/dL Final   Glucose reference range applies only to samples taken after fasting for at least 8 hours.   Glucose-Capillary 03/10/2022 133 (H)  70 - 99 mg/dL Final   Glucose reference range applies only to samples taken after fasting for at least 8 hours.   WBC 03/10/2022 7.8  4.0 - 10.5 K/uL Final   RBC 03/10/2022 4.58  3.87 - 5.11 MIL/uL Final   Hemoglobin 03/10/2022 10.7 (L)  12.0 - 15.0 g/dL Final   HCT 48/88/9169 34.7 (L)  36.0 - 46.0 % Final   MCV 03/10/2022 75.8 (L)  80.0 - 100.0 fL Final   MCH 03/10/2022 23.4 (L)  26.0 - 34.0 pg Final   MCHC  03/10/2022 30.8  30.0 - 36.0 g/dL Final   RDW 45/04/8880 17.0 (H)  11.5 - 15.5 % Final   Platelets 03/10/2022 347  150 - 400 K/uL Final   nRBC 03/10/2022 0.0  0.0 - 0.2 % Final   Neutrophils Relative % 03/10/2022 62  % Final   Neutro Abs 03/10/2022 4.8  1.7 - 7.7 K/uL Final   Lymphocytes Relative 03/10/2022 26  % Final   Lymphs Abs 03/10/2022 2.1  0.7 - 4.0 K/uL Final   Monocytes Relative 03/10/2022 10  % Final   Monocytes Absolute 03/10/2022 0.7  0.1 - 1.0 K/uL Final   Eosinophils Relative 03/10/2022 1  % Final   Eosinophils Absolute 03/10/2022 0.1  0.0 - 0.5 K/uL Final   Basophils Relative 03/10/2022 1  % Final   Basophils Absolute 03/10/2022 0.1  0.0 - 0.1 K/uL Final   Immature Granulocytes 03/10/2022 0  % Final   Abs Immature Granulocytes 03/10/2022 0.03  0.00 - 0.07 K/uL Final   Performed at [facility], [address] [place], [place] [address]   Glucose-Capillary 03/10/2022 107 (H)  70 - 99 mg/dL Final   Glucose reference range applies only to samples taken after fasting for at least 8 hours.   Glucose-Capillary 03/10/2022 225 (H)  70 - 99 mg/dL Final   Glucose reference range applies only to samples taken after fasting for at least 8 hours.   Glucose-Capillary 03/10/2022 155 (H)  70 -  99 mg/dL Final   Glucose reference range applies only to samples taken after fasting for at least 8 hours.   Glucose-Capillary 03/11/2022 127 (H)  70 - 99 mg/dL Final   Glucose reference range applies only to samples taken after fasting for at least 8 hours.   Glucose-Capillary 03/11/2022 104 (H)  70 - 99 mg/dL Final   Glucose reference range applies only to samples taken after fasting for at least 8 hours.   Glucose-Capillary 03/11/2022 123 (H)  70 - 99 mg/dL Final   Glucose reference range applies only to samples taken after fasting for at least 8 hours.   Glucose-Capillary 03/11/2022 131 (H)  70 - 99 mg/dL Final   Glucose reference range applies only to samples taken after fasting for at  least 8 hours.   Glucose-Capillary 03/12/2022 114 (H)  70 - 99 mg/dL Final   Glucose reference range applies only to samples taken after fasting for at least 8 hours.   Glucose-Capillary 03/12/2022 237 (H)  70 - 99 mg/dL Final   Glucose reference range applies only to samples taken after fasting for at least 8 hours.   Glucose-Capillary 03/12/2022 108 (H)  70 - 99 mg/dL Final   Glucose reference range applies only to samples taken after fasting for at least 8 hours.   Glucose-Capillary 03/12/2022 175 (H)  70 - 99 mg/dL Final   Glucose reference range applies only to samples taken after fasting for at least 8 hours.   Glucose-Capillary 03/13/2022 129 (H)  70 - 99 mg/dL Final   Glucose reference range applies only to samples taken after fasting for at least 8 hours.  Admission on 02/17/2022, Discharged on 02/18/2022  Component Date Value Ref Range Status   Sodium 02/17/2022 136  135 - 145 mmol/L Final   Potassium 02/17/2022 3.9  3.5 - 5.1 mmol/L Final   Chloride 02/17/2022 101  98 - 111 mmol/L Final   CO2 02/17/2022 25  22 - 32 mmol/L Final   Glucose, Bld 02/17/2022 113 (H)  70 - 99 mg/dL Final   Glucose reference range applies only to samples taken after fasting for at least 8 hours.   BUN 02/17/2022 9  6 - 20 mg/dL Final   Creatinine, Ser 02/17/2022 0.50  0.44 - 1.00 mg/dL Final   Calcium 27/25/3664 8.9  8.9 - 10.3 mg/dL Final   GFR, Estimated 02/17/2022 >60  >60 mL/min Final   Comment: (NOTE) Calculated using the CKD-EPI Creatinine Equation (2021)    Anion gap 02/17/2022 10  5 - 15 Final   Performed at [facility], [address], [place], [place] [address]   WBC 02/17/2022 6.5  4.0 - 10.5 K/uL Final   RBC 02/17/2022 4.24  3.87 - 5.11 MIL/uL Final   Hemoglobin 02/17/2022 10.2 (L)  12.0 - 15.0 g/dL Final   HCT 42/59/5638 33.2 (L)  36.0 - 46.0 % Final   MCV 02/17/2022 78.3 (L)  80.0 - 100.0 fL Final   MCH 02/17/2022 24.1 (L)  26.0 - 34.0 pg Final   MCHC 02/17/2022 30.7  30.0 -  36.0 g/dL Final   RDW 75/64/3329 17.2 (H)  11.5 - 15.5 % Final   Platelets 02/17/2022 390  150 - 400 K/uL Final   nRBC 02/17/2022 0.0  0.0 - 0.2 % Final   Performed at [facility], [address], [place], [place] [address]   Troponin I (High Sensitivity) 02/17/2022 4  <18 ng/L Final   Comment: (NOTE) Elevated  high sensitivity troponin I (hsTnI) values and significant  changes across serial measurements may suggest ACS but many other  chronic and acute conditions are known to elevate hsTnI results.  Refer to the "Links" section for chest pain algorithms and additional  guidance. Performed at [facility], [address] [place], [place] [address]    Valproic Acid Lvl 02/17/2022 17 (L)  50.0 - 100.0 ug/mL Final   Performed at [facility], [address] [place], [place] [address]   B Natriuretic Peptide 02/17/2022 30.9  0.0 - 100.0 pg/mL Final   Performed at [facility], [address] [place], [place] [address]   Troponin I (High Sensitivity) 02/18/2022 4  <18 ng/L Final   Comment: (NOTE) Elevated high sensitivity troponin I (hsTnI) values and significant  changes across serial measurements may suggest ACS but many other  chronic and acute conditions are known to elevate hsTnI results.  Refer to the "Links" section for chest pain algorithms and additional  guidance. Performed at [facility], [address] [place], [place] [address]    D-Dimer, Quant 02/18/2022 0.29  0.00 - 0.50 ug/mL-FEU Final   Comment: (NOTE) At the manufacturer cut-off value of 0.5 g/mL FEU, this assay has a negative predictive value of 95-100%.This assay is intended for use in conjunction with a clinical pretest probability (PTP) assessment model to exclude pulmonary embolism (PE) and deep venous thrombosis (DVT) in outpatients suspected of PE or DVT. Results should be correlated with clinical presentation. Performed at [facility], [address],  [place], [place] [address]   Admission on 02/09/2022, Discharged on 02/17/2022  Component Date Value Ref Range Status   Glucose-Capillary 02/09/2022 118 (H)  70 - 99 mg/dL Final   Glucose reference range applies only to samples taken after fasting for at least 8 hours.   Glucose-Capillary 02/10/2022 148 (H)  70 - 99 mg/dL Final   Glucose reference range applies only to samples taken after fasting for at least 8 hours.   Glucose-Capillary 02/10/2022 174 (H)  70 - 99 mg/dL Final   Glucose reference range applies only to samples taken after fasting for at least 8 hours.   Glucose-Capillary 02/10/2022 128 (H)  70 - 99 mg/dL Final   Glucose reference range applies only to samples taken after fasting for at least 8 hours.   Glucose-Capillary 02/10/2022 182 (H)  70 - 99 mg/dL Final   Glucose reference range applies only to samples taken after fasting for at least 8 hours.   Glucose-Capillary 02/11/2022 158 (H)  70 - 99 mg/dL Final   Glucose reference range applies only to samples taken after fasting for at least 8 hours.   Glucose-Capillary 02/11/2022 159 (H)  70 - 99 mg/dL Final   Glucose reference range applies only to samples taken after fasting for at least 8 hours.   Glucose-Capillary 02/11/2022 153 (H)  70 - 99 mg/dL Final   Glucose reference range applies only to samples taken after fasting for at least 8 hours.   Glucose-Capillary 02/11/2022 159 (H)  70 - 99 mg/dL Final   Glucose reference range applies only to samples taken after fasting for at least 8 hours.   Glucose-Capillary 02/11/2022 153 (H)  70 - 99 mg/dL Final   Glucose reference range applies only to samples taken after fasting for at least 8 hours.   Glucose-Capillary 02/11/2022 109 (H)  70 - 99 mg/dL Final   Glucose reference range applies  only to samples taken after fasting for at least 8 hours.   Glucose-Capillary 02/11/2022 213 (H)  70 - 99 mg/dL Final   Glucose reference range applies only to samples taken after fasting for at  least 8 hours.   Glucose-Capillary 02/11/2022 229 (H)  70 - 99 mg/dL Final   Glucose reference range applies only to samples taken after fasting for at least 8 hours.   Glucose-Capillary 02/12/2022 128 (H)  70 - 99 mg/dL Final   Glucose reference range applies only to samples taken after fasting for at least 8 hours.   Glucose-Capillary 02/12/2022 149 (H)  70 - 99 mg/dL Final   Glucose reference range applies only to samples taken after fasting for at least 8 hours.   Color, Urine 02/12/2022 YELLOW  YELLOW Final   APPearance 02/12/2022 CLEAR  CLEAR Final   Specific Gravity, Urine 02/12/2022 1.015  1.005 - 1.030 Final   pH 02/12/2022 5.0  5.0 - 8.0 Final   Glucose, UA 02/12/2022 NEGATIVE  NEGATIVE mg/dL Final   Hgb urine dipstick 02/12/2022 NEGATIVE  NEGATIVE Final   Bilirubin Urine 02/12/2022 NEGATIVE  NEGATIVE Final   Ketones, ur 02/12/2022 NEGATIVE  NEGATIVE mg/dL Final   Protein, ur 16/10/960412/15/2023 NEGATIVE  NEGATIVE mg/dL Final   Nitrite 54/09/811912/15/2023 NEGATIVE  NEGATIVE Final   Leukocytes,Ua 02/12/2022 NEGATIVE  NEGATIVE Final   Performed at [facility], [address] [place], [place] [address]   Specimen Description 02/12/2022 URINE, CLEAN CATCH   Final   Special Requests 02/12/2022    Final                   Value:NONE Performed at [facility], [address] [place], [place] [address]    Culture 02/12/2022 10,000 COLONIES/mL PROTEUS MIRABILIS (A)   Final   Report Status 02/12/2022 02/15/2022 FINAL   Final   Organism ID, Bacteria 02/12/2022 PROTEUS MIRABILIS (A)   Final   Glucose-Capillary 02/12/2022 128 (H)  70 - 99 mg/dL Final   Glucose reference range applies only to samples taken after fasting for at least 8 hours.   Glucose-Capillary 02/12/2022 196 (H)  70 - 99 mg/dL Final   Glucose reference range applies only to samples taken after fasting for at least 8 hours.   Glucose-Capillary 02/13/2022 168 (H)  70 - 99 mg/dL Final   Glucose reference range applies only to  samples taken after fasting for at least 8 hours.   Glucose-Capillary 02/13/2022 153 (H)  70 - 99 mg/dL Final   Glucose reference range applies only to samples taken after fasting for at least 8 hours.   Glucose-Capillary 02/13/2022 134 (H)  70 - 99 mg/dL Final   Glucose reference range applies only to samples taken after fasting for at least 8 hours.   Glucose-Capillary 02/13/2022 178 (H)  70 - 99 mg/dL Final   Glucose reference range applies only to samples taken after fasting for at least 8 hours.   Glucose-Capillary 02/14/2022 156 (H)  70 - 99 mg/dL Final   Glucose reference range applies only to samples taken after fasting for at least 8 hours.   Glucose-Capillary 02/14/2022 169 (H)  70 - 99 mg/dL Final   Glucose reference range applies only to samples taken after fasting for at least 8 hours.   Glucose-Capillary 02/14/2022 156 (H)  70 - 99 mg/dL Final   Glucose reference range applies only to samples taken after fasting for at least 8 hours.   Glucose-Capillary 02/14/2022  148 (H)  70 - 99 mg/dL Final   Glucose reference range applies only to samples taken after fasting for at least 8 hours.   Glucose-Capillary 02/15/2022 159 (H)  70 - 99 mg/dL Final   Glucose reference range applies only to samples taken after fasting for at least 8 hours.   Glucose-Capillary 02/15/2022 125 (H)  70 - 99 mg/dL Final   Glucose reference range applies only to samples taken after fasting for at least 8 hours.   Glucose-Capillary 02/15/2022 142 (H)  70 - 99 mg/dL Final   Glucose reference range applies only to samples taken after fasting for at least 8 hours.   Glucose-Capillary 02/15/2022 128 (H)  70 - 99 mg/dL Final   Glucose reference range applies only to samples taken after fasting for at least 8 hours.   Glucose-Capillary 02/16/2022 137 (H)  70 - 99 mg/dL Final   Glucose reference range applies only to samples taken after fasting for at least 8 hours.   WBC 02/16/2022 6.0  4.0 - 10.5 K/uL Final   RBC  02/16/2022 4.48  3.87 - 5.11 MIL/uL Final   Hemoglobin 02/16/2022 10.7 (L)  12.0 - 15.0 g/dL Final   HCT 16/11/9602 35.4 (L)  36.0 - 46.0 % Final   MCV 02/16/2022 79.0 (L)  80.0 - 100.0 fL Final   MCH 02/16/2022 23.9 (L)  26.0 - 34.0 pg Final   MCHC 02/16/2022 30.2  30.0 - 36.0 g/dL Final   RDW 54/10/8117 17.0 (H)  11.5 - 15.5 % Final   Platelets 02/16/2022 387  150 - 400 K/uL Final   nRBC 02/16/2022 0.0  0.0 - 0.2 % Final   Neutrophils Relative % 02/16/2022 54  % Final   Neutro Abs 02/16/2022 3.2  1.7 - 7.7 K/uL Final   Lymphocytes Relative 02/16/2022 35  % Final   Lymphs Abs 02/16/2022 2.1  0.7 - 4.0 K/uL Final   Monocytes Relative 02/16/2022 6  % Final   Monocytes Absolute 02/16/2022 0.4  0.1 - 1.0 K/uL Final   Eosinophils Relative 02/16/2022 2  % Final   Eosinophils Absolute 02/16/2022 0.1  0.0 - 0.5 K/uL Final   Basophils Relative 02/16/2022 2  % Final   Basophils Absolute 02/16/2022 0.1  0.0 - 0.1 K/uL Final   Immature Granulocytes 02/16/2022 1  % Final   Abs Immature Granulocytes 02/16/2022 0.04  0.00 - 0.07 K/uL Final   Performed at [facility], [address] [place], [place] [address]   Glucose-Capillary 02/16/2022 131 (H)  70 - 99 mg/dL Final   Glucose reference range applies only to samples taken after fasting for at least 8 hours.   Glucose-Capillary 02/16/2022 123 (H)  70 - 99 mg/dL Final   Glucose reference range applies only to samples taken after fasting for at least 8 hours.   Glucose-Capillary 02/16/2022 195 (H)  70 - 99 mg/dL Final   Glucose reference range applies only to samples taken after fasting for at least 8 hours.   Glucose-Capillary 02/17/2022 148 (H)  70 - 99 mg/dL Final   Glucose reference range applies only to samples taken after fasting for at least 8 hours.  Admission on 02/08/2022, Discharged on 02/09/2022  Component Date Value Ref Range Status   B Natriuretic Peptide 02/08/2022 40.2  0.0 - 100.0 pg/mL Final   Performed at [facility], [address] [place], [place] [address]   Troponin I (High Sensitivity) 02/08/2022 4  <18 ng/L Final  Comment: (NOTE) Elevated high sensitivity troponin I (hsTnI) values and significant  changes across serial measurements may suggest ACS but many other  chronic and acute conditions are known to elevate hsTnI results.  Refer to the "Links" section for chest pain algorithms and additional  guidance. Performed at [facility], [address], [place], [place] [address]    WBC 02/08/2022 8.5  4.0 - 10.5 K/uL Final   RBC 02/08/2022 4.30  3.87 - 5.11 MIL/uL Final   Hemoglobin 02/08/2022 10.7 (L)  12.0 - 15.0 g/dL Final   HCT 14/78/2956 33.1 (L)  36.0 - 46.0 % Final   MCV 02/08/2022 77.0 (L)  80.0 - 100.0 fL Final   MCH 02/08/2022 24.9 (L)  26.0 - 34.0 pg Final   MCHC 02/08/2022 32.3  30.0 - 36.0 g/dL Final   RDW 21/30/8657 16.5 (H)  11.5 - 15.5 % Final   Platelets 02/08/2022 361  150 - 400 K/uL Final   nRBC 02/08/2022 0.0  0.0 - 0.2 % Final   Performed at [facility], [address] [place], [place] [address]   Sodium 02/08/2022 131 (L)  135 - 145 mmol/L Final   Potassium 02/08/2022 4.4  3.5 - 5.1 mmol/L Final   Chloride 02/08/2022 96 (L)  98 - 111 mmol/L Final   CO2 02/08/2022 25  22 - 32 mmol/L Final   Glucose, Bld 02/08/2022 126 (H)  70 - 99 mg/dL Final   Glucose reference range applies only to samples taken after fasting for at least 8 hours.   BUN 02/08/2022 11  6 - 20 mg/dL Final   Creatinine, Ser 02/08/2022 0.52  0.44 - 1.00 mg/dL Final   Calcium 29/52/8413 9.4  8.9 - 10.3 mg/dL Final   Total Protein 24/40/1027 7.5  6.5 - 8.1 g/dL Final   Albumin 25/36/6440 3.6  3.5 - 5.0 g/dL Final   AST 34/74/2595 22  15 - 41 U/L Final   ALT 02/08/2022 20  0 - 44 U/L Final   Alkaline Phosphatase 02/08/2022 67  38 - 126 U/L Final   Total Bilirubin 02/08/2022 0.1 (L)  0.3 - 1.2 mg/dL Final   GFR, Estimated 02/08/2022 >60  >60 mL/min Final   Comment: (NOTE) Calculated  using the CKD-EPI Creatinine Equation (2021)    Anion gap 02/08/2022 10  5 - 15 Final   Performed at [facility], [address], [place], [place] [address]   Troponin I (High Sensitivity) 02/08/2022 5  <18 ng/L Final   Comment: (NOTE) Elevated high sensitivity troponin I (hsTnI) values and significant  changes across serial measurements may suggest ACS but many other  chronic and acute conditions are known to elevate hsTnI results.  Refer to the "Links" section for chest pain algorithms and additional  guidance. Performed at [facility], [address] [place], [place] [address]    WBC 02/09/2022 7.3  4.0 - 10.5 K/uL Final   RBC 02/09/2022 4.70  3.87 - 5.11 MIL/uL Final   Hemoglobin 02/09/2022 11.4 (L)  12.0 - 15.0 g/dL Final   HCT 95/18/8416 36.6  36.0 - 46.0 % Final   MCV 02/09/2022 77.9 (L)  80.0 - 100.0 fL Final   MCH 02/09/2022 24.3 (L)  26.0 - 34.0 pg Final   MCHC 02/09/2022 31.1  30.0 - 36.0 g/dL Final   RDW 60/63/0160 16.6 (H)  11.5 - 15.5 % Final   Platelets 02/09/2022 403 (H)  150 - 400 K/uL Final  nRBC 02/09/2022 0.0  0.0 - 0.2 % Final   Performed at [facility], [address] [place], [place] [address]   Glucose-Capillary 02/08/2022 117 (H)  70 - 99 mg/dL Final   Glucose reference range applies only to samples taken after fasting for at least 8 hours.   Sodium 02/09/2022 138  135 - 145 mmol/L Final   Potassium 02/09/2022 3.7  3.5 - 5.1 mmol/L Final   Chloride 02/09/2022 103  98 - 111 mmol/L Final   CO2 02/09/2022 24  22 - 32 mmol/L Final   Glucose, Bld 02/09/2022 134 (H)  70 - 99 mg/dL Final   Glucose reference range applies only to samples taken after fasting for at least 8 hours.   BUN 02/09/2022 9  6 - 20 mg/dL Final   Creatinine, Ser 02/09/2022 0.44  0.44 - 1.00 mg/dL Final   Calcium 81/19/147812/01/2022 8.3 (L)  8.9 - 10.3 mg/dL Final   GFR, Estimated 02/09/2022 >60  >60 mL/min Final   Comment: (NOTE) Calculated using the CKD-EPI Creatinine  Equation (2021)    Anion gap 02/09/2022 11  5 - 15 Final   Performed at [facility], [address] [place], [place] [address]   Iron 02/09/2022 20 (L)  28 - 170 ug/dL Final   TIBC 13/08/657812/01/2022 455 (H)  250 - 450 ug/dL Final   Saturation Ratios 02/09/2022 4 (L)  10.4 - 31.8 % Final   UIBC 02/09/2022 435  ug/dL Final   Performed at [facility], [address] [place], [place] [address]   Ferritin 02/09/2022 2 (L)  11 - 307 ng/mL Final   Performed at [facility], [address] [place], [place] [address]   Weight 02/09/2022 2,846.58  oz Final   Height 02/09/2022 62  in Final   BP 02/09/2022 143/80  mmHg Final   WBC 02/09/2022 8.1  4.0 - 10.5 K/uL Final   RBC 02/09/2022 4.38  3.87 - 5.11 MIL/uL Final   Hemoglobin 02/09/2022 10.9 (L)  12.0 - 15.0 g/dL Final   HCT 13/24/401012/01/2022 33.3 (L)  36.0 - 46.0 % Final   MCV 02/09/2022 76.0 (L)  80.0 - 100.0 fL Final   MCH 02/09/2022 24.9 (L)  26.0 - 34.0 pg Final   MCHC 02/09/2022 32.7  30.0 - 36.0 g/dL Final   RDW 27/25/366412/01/2022 16.5 (H)  11.5 - 15.5 % Final   Platelets 02/09/2022 397  150 - 400 K/uL Final   nRBC 02/09/2022 0.0  0.0 - 0.2 % Final   Neutrophils Relative % 02/09/2022 66  % Final   Neutro Abs 02/09/2022 5.4  1.7 - 7.7 K/uL Final   Lymphocytes Relative 02/09/2022 24  % Final   Lymphs Abs 02/09/2022 1.9  0.7 - 4.0 K/uL Final   Monocytes Relative 02/09/2022 8  % Final   Monocytes Absolute 02/09/2022 0.6  0.1 - 1.0 K/uL Final   Eosinophils Relative 02/09/2022 1  % Final   Eosinophils Absolute 02/09/2022 0.1  0.0 - 0.5 K/uL Final   Basophils Relative 02/09/2022 1  % Final   Basophils Absolute 02/09/2022 0.1  0.0 - 0.1 K/uL Final   Immature Granulocytes 02/09/2022 0  % Final   Abs Immature Granulocytes 02/09/2022 0.03  0.00 - 0.07 K/uL Final   Performed at [facility], [address] [place], [place] [address]   Glucose-Capillary 02/09/2022 238 (H)  70 - 99 mg/dL Final   Glucose reference range applies only to  samples taken after fasting for at least 8 hours.   Glucose-Capillary 02/09/2022 91  70 - 99 mg/dL Final   Glucose reference range applies only to samples taken after fasting for at least 8 hours.   CRP 02/09/2022 <0.5  <1.0 mg/dL Final   Performed at [facility], [address], [place], [place] [address]   Sed Rate 02/09/2022 15  0 - 22 mm/hr Final   Performed at [facility], [address] [place], [place] [address]   Glucose-Capillary 02/09/2022 137 (H)  70 - 99 mg/dL Final   Glucose reference range applies only to samples taken after fasting for at least 8 hours.  Admission on 01/24/2022, Discharged on 02/08/2022  Component Date Value Ref Range Status   Glucose-Capillary 01/24/2022 143 (H)  70 - 99 mg/dL Final   Glucose reference range applies only to samples taken after fasting for at least 8 hours.   Glucose-Capillary 01/24/2022 120 (H)  70 - 99 mg/dL Final   Glucose reference range applies only to samples taken after fasting for at least 8 hours.   Glucose-Capillary 01/25/2022 122 (H)  70 - 99 mg/dL Final   Glucose reference range applies only to samples taken after fasting for at least 8 hours.   Glucose-Capillary 01/25/2022 190 (H)  70 - 99 mg/dL Final   Glucose reference range applies only to samples taken after fasting for at least 8 hours.   Glucose-Capillary 01/25/2022 163 (H)  70 - 99 mg/dL Final   Glucose reference range applies only to samples taken after fasting for at least 8 hours.   WBC 01/26/2022 6.5  4.0 - 10.5 K/uL Final   RBC 01/26/2022 4.53  3.87 - 5.11 MIL/uL Final   Hemoglobin 01/26/2022 11.3 (L)  12.0 - 15.0 g/dL Final   HCT 09/81/1914 36.4  36.0 - 46.0 % Final   MCV 01/26/2022 80.4  80.0 - 100.0 fL Final   MCH 01/26/2022 24.9 (L)  26.0 - 34.0 pg Final   MCHC 01/26/2022 31.0  30.0 - 36.0 g/dL Final   RDW 78/29/5621 17.3 (H)  11.5 - 15.5 % Final   Platelets 01/26/2022 327  150 - 400 K/uL Final   nRBC 01/26/2022 0.0  0.0 - 0.2 % Final    Neutrophils Relative % 01/26/2022 60  % Final   Neutro Abs 01/26/2022 3.9  1.7 - 7.7 K/uL Final   Lymphocytes Relative 01/26/2022 31  % Final   Lymphs Abs 01/26/2022 2.0  0.7 - 4.0 K/uL Final   Monocytes Relative 01/26/2022 7  % Final   Monocytes Absolute 01/26/2022 0.5  0.1 - 1.0 K/uL Final   Eosinophils Relative 01/26/2022 1  % Final   Eosinophils Absolute 01/26/2022 0.1  0.0 - 0.5 K/uL Final   Basophils Relative 01/26/2022 1  % Final   Basophils Absolute 01/26/2022 0.1  0.0 - 0.1 K/uL Final   Immature Granulocytes 01/26/2022 0  % Final   Abs Immature Granulocytes 01/26/2022 0.02  0.00 - 0.07 K/uL Final   Performed at [facility], [address], [place], [place] [address]   Glucose-Capillary 01/26/2022 149 (H)  70 - 99 mg/dL Final   Glucose reference range applies only to samples taken after fasting for at least 8 hours.   Glucose-Capillary 01/26/2022 130 (H)  70 - 99 mg/dL Final   Glucose reference range applies only to samples taken after fasting for at least 8 hours.   Glucose-Capillary 01/26/2022 151 (H)  70 -  99 mg/dL Final   Glucose reference range applies only to samples taken after fasting for at least 8 hours.   Glucose-Capillary 01/26/2022 170 (H)  70 - 99 mg/dL Final   Glucose reference range applies only to samples taken after fasting for at least 8 hours.   Glucose-Capillary 01/27/2022 129 (H)  70 - 99 mg/dL Final   Glucose reference range applies only to samples taken after fasting for at least 8 hours.   Glucose-Capillary 01/27/2022 101 (H)  70 - 99 mg/dL Final   Glucose reference range applies only to samples taken after fasting for at least 8 hours.   Glucose-Capillary 01/27/2022 220 (H)  70 - 99 mg/dL Final   Glucose reference range applies only to samples taken after fasting for at least 8 hours.   Glucose-Capillary 01/27/2022 154 (H)  70 - 99 mg/dL Final   Glucose reference range applies only to samples taken after fasting for at least 8 hours.    Glucose-Capillary 01/27/2022 163 (H)  70 - 99 mg/dL Final   Glucose reference range applies only to samples taken after fasting for at least 8 hours.   Glucose-Capillary 01/28/2022 127 (H)  70 - 99 mg/dL Final   Glucose reference range applies only to samples taken after fasting for at least 8 hours.   Glucose-Capillary 01/28/2022 110 (H)  70 - 99 mg/dL Final   Glucose reference range applies only to samples taken after fasting for at least 8 hours.   Glucose-Capillary 01/28/2022 167 (H)  70 - 99 mg/dL Final   Glucose reference range applies only to samples taken after fasting for at least 8 hours.   Glucose-Capillary 01/29/2022 145 (H)  70 - 99 mg/dL Final   Glucose reference range applies only to samples taken after fasting for at least 8 hours.   Glucose-Capillary 01/29/2022 203 (H)  70 - 99 mg/dL Final   Glucose reference range applies only to samples taken after fasting for at least 8 hours.   Glucose-Capillary 01/29/2022 153 (H)  70 - 99 mg/dL Final   Glucose reference range applies only to samples taken after fasting for at least 8 hours.   Glucose-Capillary 01/29/2022 166 (H)  70 - 99 mg/dL Final   Glucose reference range applies only to samples taken after fasting for at least 8 hours.   Glucose-Capillary 01/30/2022 142 (H)  70 - 99 mg/dL Final   Glucose reference range applies only to samples taken after fasting for at least 8 hours.   Glucose-Capillary 01/30/2022 138 (H)  70 - 99 mg/dL Final   Glucose reference range applies only to samples taken after fasting for at least 8 hours.   Glucose-Capillary 01/30/2022 185 (H)  70 - 99 mg/dL Final   Glucose reference range applies only to samples taken after fasting for at least 8 hours.   Glucose-Capillary 01/30/2022 220 (H)  70 - 99 mg/dL Final   Glucose reference range applies only to samples taken after fasting for at least 8 hours.   Glucose-Capillary 01/31/2022 147 (H)  70 - 99 mg/dL Final   Glucose reference range applies only to  samples taken after fasting for at least 8 hours.   Glucose-Capillary 01/31/2022 131 (H)  70 - 99 mg/dL Final   Glucose reference range applies only to samples taken after fasting for at least 8 hours.   Glucose-Capillary 01/31/2022 169 (H)  70 - 99 mg/dL Final   Glucose reference range applies only to samples taken after fasting for at least  8 hours.   Glucose-Capillary 01/31/2022 144 (H)  70 - 99 mg/dL Final   Glucose reference range applies only to samples taken after fasting for at least 8 hours.   Comment 1 01/31/2022 RN AWARE   Final   Glucose-Capillary 02/01/2022 117 (H)  70 - 99 mg/dL Final   Glucose reference range applies only to samples taken after fasting for at least 8 hours.   Glucose-Capillary 02/01/2022 180 (H)  70 - 99 mg/dL Final   Glucose reference range applies only to samples taken after fasting for at least 8 hours.   Glucose-Capillary 02/01/2022 209 (H)  70 - 99 mg/dL Final   Glucose reference range applies only to samples taken after fasting for at least 8 hours.   Glucose-Capillary 02/02/2022 131 (H)  70 - 99 mg/dL Final   Glucose reference range applies only to samples taken after fasting for at least 8 hours.   WBC 02/02/2022 7.5  4.0 - 10.5 K/uL Final   RBC 02/02/2022 4.52  3.87 - 5.11 MIL/uL Final   Hemoglobin 02/02/2022 11.3 (L)  12.0 - 15.0 g/dL Final   HCT 16/11/9602 35.0 (L)  36.0 - 46.0 % Final   MCV 02/02/2022 77.4 (L)  80.0 - 100.0 fL Final   MCH 02/02/2022 25.0 (L)  26.0 - 34.0 pg Final   MCHC 02/02/2022 32.3  30.0 - 36.0 g/dL Final   RDW 54/10/8117 16.7 (H)  11.5 - 15.5 % Final   Platelets 02/02/2022 345  150 - 400 K/uL Final   nRBC 02/02/2022 0.0  0.0 - 0.2 % Final   Neutrophils Relative % 02/02/2022 63  % Final   Neutro Abs 02/02/2022 4.7  1.7 - 7.7 K/uL Final   Lymphocytes Relative 02/02/2022 28  % Final   Lymphs Abs 02/02/2022 2.1  0.7 - 4.0 K/uL Final   Monocytes Relative 02/02/2022 7  % Final   Monocytes Absolute 02/02/2022 0.5  0.1 - 1.0  K/uL Final   Eosinophils Relative 02/02/2022 1  % Final   Eosinophils Absolute 02/02/2022 0.1  0.0 - 0.5 K/uL Final   Basophils Relative 02/02/2022 1  % Final   Basophils Absolute 02/02/2022 0.1  0.0 - 0.1 K/uL Final   Immature Granulocytes 02/02/2022 0  % Final   Abs Immature Granulocytes 02/02/2022 0.03  0.00 - 0.07 K/uL Final   Performed at [facility], [address], [place], [place] [address]   Glucose-Capillary 02/02/2022 95  70 - 99 mg/dL Final   Glucose reference range applies only to samples taken after fasting for at least 8 hours.   Glucose-Capillary 02/02/2022 120 (H)  70 - 99 mg/dL Final   Glucose reference range applies only to samples taken after fasting for at least 8 hours.   Glucose-Capillary 02/02/2022 191 (H)  70 - 99 mg/dL Final   Glucose reference range applies only to samples taken after fasting for at least 8 hours.   Glucose-Capillary 02/03/2022 148 (H)  70 - 99 mg/dL Final   Glucose reference range applies only to samples taken after fasting for at least 8 hours.   Glucose-Capillary 02/03/2022 122 (H)  70 - 99 mg/dL Final   Glucose reference range applies only to samples taken after fasting for at least 8 hours.   Glucose-Capillary 02/03/2022 233 (H)  70 - 99 mg/dL Final   Glucose reference range applies only to samples taken after fasting for at least 8 hours.   Glucose-Capillary 02/04/2022 126 (H)  70 - 99 mg/dL  Final   Glucose reference range applies only to samples taken after fasting for at least 8 hours.   Glucose-Capillary 02/04/2022 117 (H)  70 - 99 mg/dL Final   Glucose reference range applies only to samples taken after fasting for at least 8 hours.   Glucose-Capillary 02/04/2022 135 (H)  70 - 99 mg/dL Final   Glucose reference range applies only to samples taken after fasting for at least 8 hours.   Glucose-Capillary 02/04/2022 197 (H)  70 - 99 mg/dL Final   Glucose reference range applies only to samples taken after fasting for at least 8  hours.   Glucose-Capillary 02/05/2022 170 (H)  70 - 99 mg/dL Final   Glucose reference range applies only to samples taken after fasting for at least 8 hours.   Glucose-Capillary 02/05/2022 107 (H)  70 - 99 mg/dL Final   Glucose reference range applies only to samples taken after fasting for at least 8 hours.   Glucose-Capillary 02/05/2022 184 (H)  70 - 99 mg/dL Final   Glucose reference range applies only to samples taken after fasting for at least 8 hours.   Glucose-Capillary 02/05/2022 256 (H)  70 - 99 mg/dL Final   Glucose reference range applies only to samples taken after fasting for at least 8 hours.   Glucose-Capillary 02/06/2022 144 (H)  70 - 99 mg/dL Final   Glucose reference range applies only to samples taken after fasting for at least 8 hours.   Glucose-Capillary 02/06/2022 190 (H)  70 - 99 mg/dL Final   Glucose reference range applies only to samples taken after fasting for at least 8 hours.   Glucose-Capillary 02/06/2022 92  70 - 99 mg/dL Final   Glucose reference range applies only to samples taken after fasting for at least 8 hours.   Trichomonas 02/06/2022 Negative   Final   Bacterial Vaginitis-Urine 02/06/2022 Negative   Final   Molecular Comment 02/06/2022 For tests bacteria and/or candida, this specimen does not meet the   Final   Molecular Comment 02/06/2022 strict criteria set by the FDA. The result interpretation should be   Final   Molecular Comment 02/06/2022 considered in conjunction with the patient's clinical history.   Final   Comment 02/06/2022 Normal Reference Range Trichomonas - Negative   Final   Glucose-Capillary 02/06/2022 197 (H)  70 - 99 mg/dL Final   Glucose reference range applies only to samples taken after fasting for at least 8 hours.   Glucose-Capillary 02/07/2022 129 (H)  70 - 99 mg/dL Final   Glucose reference range applies only to samples taken after fasting for at least 8 hours.   Glucose-Capillary 02/07/2022 207 (H)  70 - 99 mg/dL Final    Glucose reference range applies only to samples taken after fasting for at least 8 hours.   Glucose-Capillary 02/07/2022 153 (H)  70 - 99 mg/dL Final   Glucose reference range applies only to samples taken after fasting for at least 8 hours.   Glucose-Capillary 02/08/2022 129 (H)  70 - 99 mg/dL Final   Glucose reference range applies only to samples taken after fasting for at least 8 hours.   Glucose-Capillary 02/08/2022 107 (H)  70 - 99 mg/dL Final   Glucose reference range applies only to samples taken after fasting for at least 8 hours.  Admission on 01/23/2022, Discharged on 01/24/2022  Component Date Value Ref Range Status   Sodium 01/23/2022 140  135 - 145 mmol/L Final   Potassium 01/23/2022 4.4  3.5 -  5.1 mmol/L Final   Chloride 01/23/2022 101  98 - 111 mmol/L Final   CO2 01/23/2022 25  22 - 32 mmol/L Final   Glucose, Bld 01/23/2022 198 (H)  70 - 99 mg/dL Final   Glucose reference range applies only to samples taken after fasting for at least 8 hours.   BUN 01/23/2022 13  6 - 20 mg/dL Final   Creatinine, Ser 01/23/2022 0.62  0.44 - 1.00 mg/dL Final   Calcium 16/11/9602 9.3  8.9 - 10.3 mg/dL Final   GFR, Estimated 01/23/2022 >60  >60 mL/min Final   Comment: (NOTE) Calculated using the CKD-EPI Creatinine Equation (2021)    Anion gap 01/23/2022 14  5 - 15 Final   Performed at [facility], [address], [place], [place] [address]   WBC 01/23/2022 5.8  4.0 - 10.5 K/uL Final   RBC 01/23/2022 4.63  3.87 - 5.11 MIL/uL Final   Hemoglobin 01/23/2022 11.7 (L)  12.0 - 15.0 g/dL Final   HCT 11/91/4782 37.4  36.0 - 46.0 % Final   MCV 01/23/2022 80.8  80.0 - 100.0 fL Final   MCH 01/23/2022 25.3 (L)  26.0 - 34.0 pg Final   MCHC 01/23/2022 31.3  30.0 - 36.0 g/dL Final   RDW 95/62/1308 17.9 (H)  11.5 - 15.5 % Final   Platelets 01/23/2022 333  150 - 400 K/uL Final   nRBC 01/23/2022 0.0  0.0 - 0.2 % Final   Performed at [facility], [address], [place], [place] [address]    Troponin I (High Sensitivity) 01/23/2022 6  <18 ng/L Final   Comment: (NOTE) Elevated high sensitivity troponin I (hsTnI) values and significant  changes across serial measurements may suggest ACS but many other  chronic and acute conditions are known to elevate hsTnI results.  Refer to the "Links" section for chest pain algorithms and additional  guidance. Performed at [facility], [address], [place], [place] [address]    Troponin I (High Sensitivity) 01/23/2022 5  <18 ng/L Final   Comment: (NOTE) Elevated high sensitivity troponin I (hsTnI) values and significant  changes across serial measurements may suggest ACS but many other  chronic and acute conditions are known to elevate hsTnI results.  Refer to the "Links" section for chest pain algorithms and additional  guidance. Performed at [facility], [address], [place], [place] [address]    SARS Coronavirus 2 by RT PCR 01/23/2022 NEGATIVE  NEGATIVE Final   Comment: (NOTE) SARS-CoV-2 target nucleic acids are NOT DETECTED.  The SARS-CoV-2 RNA is generally detectable in upper and lower respiratory specimens during the acute phase of infection. The lowest concentration of SARS-CoV-2 viral copies this assay can detect is 250 copies / mL. A negative result does not preclude SARS-CoV-2 infection and should not be used as the sole basis for treatment or other patient management decisions.  A negative result may occur with improper specimen collection / handling, submission of specimen other than nasopharyngeal swab, presence of viral mutation(s) within the areas targeted by this assay, and inadequate number of viral copies (<250 copies / mL). A negative result must be combined with clinical observations, patient history, and epidemiological information.  Fact Sheet for Patients:   RoadLapTop.co.za  Fact Sheet for Healthcare Providers: http://kim-miller.com/  This  test is not yet approved or  cleared by the [place] and has been authorized for detection and/or diagnosis of SARS-CoV-2 by FDA under an Emergency Use Authorization (EUA).  This EUA will remain in effect (meaning this test can be used) for the duration of the COVID-19 declaration under Section 564(b)(1) of the Act, 21 U.S.C. section 360bbb-3(b)(1), unless the authorization is terminated or revoked sooner.  Performed at [facility], [address], [place], [place] [address]    B Natriuretic Peptide 01/23/2022 41.3  0.0 - 100.0 pg/mL Final   Performed at [facility], [address] [place], [place] [address]   Group A Strep by PCR 01/23/2022 NOT DETECTED  NOT DETECTED Final   Performed at [facility], [address], [place], [place] [address]   Color, Urine 01/23/2022 YELLOW  YELLOW Final   APPearance 01/23/2022 CLEAR  CLEAR Final   Specific Gravity, Urine 01/23/2022 1.018  1.005 - 1.030 Final   pH 01/23/2022 7.0  5.0 - 8.0 Final   Glucose, UA 01/23/2022 NEGATIVE  NEGATIVE mg/dL Final   Hgb urine dipstick 01/23/2022 NEGATIVE  NEGATIVE Final   Bilirubin Urine 01/23/2022 NEGATIVE  NEGATIVE Final   Ketones, ur 01/23/2022 NEGATIVE  NEGATIVE mg/dL Final   Protein, ur 91/47/8295 NEGATIVE  NEGATIVE mg/dL Final   Nitrite 62/13/0865 NEGATIVE  NEGATIVE Final   Leukocytes,Ua 01/23/2022 TRACE (A)  NEGATIVE Final   RBC / HPF 01/23/2022 0-5  0 - 5 RBC/hpf Final   WBC, UA 01/23/2022 0-5  0 - 5 WBC/hpf Final   Bacteria, UA 01/23/2022 NONE SEEN  NONE SEEN Final   Squamous Epithelial / HPF 01/23/2022 0-5  0 - 5 Final   Mucus 01/23/2022 PRESENT   Final   Performed at [facility], [address], [place], [place] [address]   SARS Coronavirus 2 by RT PCR 01/23/2022 NEGATIVE  NEGATIVE Final   Comment: (NOTE) SARS-CoV-2 target nucleic acids are NOT DETECTED.  The SARS-CoV-2 RNA is generally detectable in upper respiratory specimens during  the acute phase of infection. The lowest concentration of SARS-CoV-2 viral copies this assay can detect is 138 copies/mL. A negative result does not preclude SARS-Cov-2 infection and should not be used as the sole basis for treatment or other patient management decisions. A negative result may occur with  improper specimen collection/handling, submission of specimen other than nasopharyngeal swab, presence of viral mutation(s) within the areas targeted by this assay, and inadequate number of viral copies(<138 copies/mL). A negative result must be combined with clinical observations, patient history, and epidemiological information. The expected result is Negative.  Fact Sheet for Patients:  BloggerCourse.com  Fact Sheet for Healthcare Providers:  SeriousBroker.it  This test is no                          t yet approved or cleared by the [place] FDA and  has been authorized for detection and/or diagnosis of SARS-CoV-2 by FDA under an Emergency Use Authorization (EUA). This EUA will remain  in effect (meaning this test can be used) for the duration of the COVID-19 declaration under Section 564(b)(1) of the Act, 21 U.S.C.section 360bbb-3(b)(1), unless the authorization is terminated  or revoked sooner.       Influenza A by PCR 01/23/2022 NEGATIVE  NEGATIVE Final   Influenza B by PCR 01/23/2022 NEGATIVE  NEGATIVE Final   Comment: (NOTE) The Xpert Xpress SARS-CoV-2/FLU/RSV plus assay is intended as an aid in the  diagnosis of influenza from Nasopharyngeal swab specimens and should not be used as a sole basis for treatment. Nasal washings and aspirates are unacceptable for Xpert Xpress SARS-CoV-2/FLU/RSV testing.  Fact Sheet for Patients: EntrepreneurPulse.com.au  Fact Sheet for Healthcare Providers: IncredibleEmployment.be  This test is not yet approved or cleared by the [place] FDA  and has been authorized for detection and/or diagnosis of SARS-CoV-2 by FDA under an Emergency Use Authorization (EUA). This EUA will remain in effect (meaning this test can be used) for the duration of the COVID-19 declaration under Section 564(b)(1) of the Act, 21 U.S.C. section 360bbb-3(b)(1), unless the authorization is terminated or revoked.  Performed at [facility], [place] [address] [place], [place] [address]    WBC 01/24/2022 6.1  4.0 - 10.5 K/uL Final   RBC 01/24/2022 4.60  3.87 - 5.11 MIL/uL Final   Hemoglobin 01/24/2022 11.7 (L)  12.0 - 15.0 g/dL Final   HCT 01/24/2022 37.0  36.0 - 46.0 % Final   MCV 01/24/2022 80.4  80.0 - 100.0 fL Final   MCH 01/24/2022 25.4 (L)  26.0 - 34.0 pg Final   MCHC 01/24/2022 31.6  30.0 - 36.0 g/dL Final   RDW 01/24/2022 17.6 (H)  11.5 - 15.5 % Final   Platelets 01/24/2022 334  150 - 400 K/uL Final   nRBC 01/24/2022 0.0  0.0 - 0.2 % Final   Neutrophils Relative % 01/24/2022 53  % Final   Neutro Abs 01/24/2022 3.3  1.7 - 7.7 K/uL Final   Lymphocytes Relative 01/24/2022 33  % Final   Lymphs Abs 01/24/2022 2.0  0.7 - 4.0 K/uL Final   Monocytes Relative 01/24/2022 11  % Final   Monocytes Absolute 01/24/2022 0.7  0.1 - 1.0 K/uL Final   Eosinophils Relative 01/24/2022 2  % Final   Eosinophils Absolute 01/24/2022 0.1  0.0 - 0.5 K/uL Final   Basophils Relative 01/24/2022 1  % Final   Basophils Absolute 01/24/2022 0.1  0.0 - 0.1 K/uL Final   Immature Granulocytes 01/24/2022 0  % Final   Abs Immature Granulocytes 01/24/2022 0.02  0.00 - 0.07 K/uL Final   Performed at [facility], [place] [address], [place], [place] [address]   Glucose-Capillary 01/24/2022 110 (H)  70 - 99 mg/dL Final   Glucose reference range applies only to samples taken after fasting for at least 8 hours.  No results displayed because visit has over 200 results.    Admission on 11/22/2021, Discharged on 11/22/2021  Component Date Value Ref Range Status    Glucose-Capillary 11/22/2021 169 (H)  70 - 99 mg/dL Final   Glucose reference range applies only to samples taken after fasting for at least 8 hours.  No results displayed because visit has over 200 results.    Admission on 10/04/2021, Discharged on 10/22/2021  Component Date Value Ref Range Status   SARS Coronavirus 2 by RT PCR 10/04/2021 NEGATIVE  NEGATIVE Final   Comment: (NOTE) SARS-CoV-2 target nucleic acids are NOT DETECTED.  The SARS-CoV-2 RNA is generally detectable in upper respiratory specimens during the acute phase of infection. The lowest concentration of SARS-CoV-2 viral copies this assay can detect is 138 copies/mL. A negative result does not preclude SARS-Cov-2 infection and should not be used as the sole basis for treatment or other patient management decisions. A negative result may occur with  improper specimen collection/handling, submission of specimen other than nasopharyngeal swab, presence of viral mutation(s) within the areas targeted by this assay,  and inadequate number of viral copies(<138 copies/mL). A negative result must be combined with clinical observations, patient history, and epidemiological information. The expected result is Negative.  Fact Sheet for Patients:  BloggerCourse.com  Fact Sheet for Healthcare Providers:  SeriousBroker.it  This test is no                          t yet approved or cleared by the [place] FDA and  has been authorized for detection and/or diagnosis of SARS-CoV-2 by FDA under an Emergency Use Authorization (EUA). This EUA will remain  in effect (meaning this test can be used) for the duration of the COVID-19 declaration under Section 564(b)(1) of the Act, 21 U.S.C.section 360bbb-3(b)(1), unless the authorization is terminated  or revoked sooner.       Influenza A by PCR 10/04/2021 NEGATIVE  NEGATIVE Final   Influenza B by PCR 10/04/2021 NEGATIVE  NEGATIVE Final    Comment: (NOTE) The Xpert Xpress SARS-CoV-2/FLU/RSV plus assay is intended as an aid in the diagnosis of influenza from Nasopharyngeal swab specimens and should not be used as a sole basis for treatment. Nasal washings and aspirates are unacceptable for Xpert Xpress SARS-CoV-2/FLU/RSV testing.  Fact Sheet for Patients: BloggerCourse.com  Fact Sheet for Healthcare Providers: SeriousBroker.it  This test is not yet approved or cleared by the [place] FDA and has been authorized for detection and/or diagnosis of SARS-CoV-2 by FDA under an Emergency Use Authorization (EUA). This EUA will remain in effect (meaning this test can be used) for the duration of the COVID-19 declaration under Section 564(b)(1) of the Act, 21 U.S.C. section 360bbb-3(b)(1), unless the authorization is terminated or revoked.  Performed at [facility], [address] [place], [place] [address]    WBC 10/04/2021 8.4  4.0 - 10.5 K/uL Final   RBC 10/04/2021 4.42  3.87 - 5.11 MIL/uL Final   Hemoglobin 10/04/2021 11.6 (L)  12.0 - 15.0 g/dL Final   HCT 75/64/3329 35.7 (L)  36.0 - 46.0 % Final   MCV 10/04/2021 80.8  80.0 - 100.0 fL Final   MCH 10/04/2021 26.2  26.0 - 34.0 pg Final   MCHC 10/04/2021 32.5  30.0 - 36.0 g/dL Final   RDW 51/88/4166 16.0 (H)  11.5 - 15.5 % Final   Platelets 10/04/2021 372  150 - 400 K/uL Final   nRBC 10/04/2021 0.0  0.0 - 0.2 % Final   Neutrophils Relative % 10/04/2021 68  % Final   Neutro Abs 10/04/2021 5.7  1.7 - 7.7 K/uL Final   Lymphocytes Relative 10/04/2021 22  % Final   Lymphs Abs 10/04/2021 1.8  0.7 - 4.0 K/uL Final   Monocytes Relative 10/04/2021 8  % Final   Monocytes Absolute 10/04/2021 0.7  0.1 - 1.0 K/uL Final   Eosinophils Relative 10/04/2021 1  % Final   Eosinophils Absolute 10/04/2021 0.1  0.0 - 0.5 K/uL Final   Basophils Relative 10/04/2021 1  % Final   Basophils Absolute 10/04/2021 0.1  0.0 - 0.1 K/uL  Final   Immature Granulocytes 10/04/2021 0  % Final   Abs Immature Granulocytes 10/04/2021 0.03  0.00 - 0.07 K/uL Final   Performed at [facility], [address], [place], [place] [address]   Sodium 10/04/2021 136  135 - 145 mmol/L Final   Potassium 10/04/2021 4.2  3.5 - 5.1 mmol/L Final   Chloride 10/04/2021 104  98 - 111 mmol/L Final   CO2  10/04/2021 25  22 - 32 mmol/L Final   Glucose, Bld 10/04/2021 100 (H)  70 - 99 mg/dL Final   Glucose reference range applies only to samples taken after fasting for at least 8 hours.   BUN 10/04/2021 9  6 - 20 mg/dL Final   Creatinine, Ser 10/04/2021 0.52  0.44 - 1.00 mg/dL Final   Calcium 40/98/1191 9.0  8.9 - 10.3 mg/dL Final   Total Protein 47/82/9562 7.0  6.5 - 8.1 g/dL Final   Albumin 13/09/6576 3.2 (L)  3.5 - 5.0 g/dL Final   AST 46/96/2952 13 (L)  15 - 41 U/L Final   ALT 10/04/2021 10  0 - 44 U/L Final   Alkaline Phosphatase 10/04/2021 61  38 - 126 U/L Final   Total Bilirubin 10/04/2021 0.3  0.3 - 1.2 mg/dL Final   GFR, Estimated 10/04/2021 >60  >60 mL/min Final   Comment: (NOTE) Calculated using the CKD-EPI Creatinine Equation (2021)    Anion gap 10/04/2021 7  5 - 15 Final   Performed at [facility], [address] [place], [place] [address]   Hgb A1c MFr Bld 10/04/2021 7.0 (H)  4.8 - 5.6 % Final   Comment: (NOTE) Pre diabetes:          5.7%-6.4%  Diabetes:              >6.4%  Glycemic control for   <7.0% adults with diabetes    Mean Plasma Glucose 10/04/2021 154.2  mg/dL Final   Performed at [facility], [address] [place], [place] [address]   Cholesterol 10/04/2021 178  0 - 200 mg/dL Final   Triglycerides 27/25/3664 155 (H)  <150 mg/dL Final   HDL 40/34/7425 52  >40 mg/dL Final   Total CHOL/HDL Ratio 10/04/2021 3.4  RATIO Final   VLDL 10/04/2021 31  0 - 40 mg/dL Final   LDL Cholesterol 10/04/2021 95  0 - 99 mg/dL Final   Comment:        Total Cholesterol/HDL:CHD Risk Coronary Heart Disease Risk  Table                     Men   Women  1/2 Average Risk   3.4   3.3  Average Risk       5.0   4.4  2 X Average Risk   9.6   7.1  3 X Average Risk  23.4   11.0        Use the calculated Patient Ratio above and the CHD Risk Table to determine the patient's CHD Risk.        ATP III CLASSIFICATION (LDL):  <100     mg/dL   Optimal  956-387  mg/dL   Near or Above                    Optimal  130-159  mg/dL   Borderline  564-332  mg/dL   High  >951     mg/dL   Very High Performed at [facility], [address], [place], [place] [address]    POC Amphetamine UR 10/04/2021 None Detected  NONE DETECTED (Cut Off Level 1000 ng/mL) Final   POC Secobarbital (BAR) 10/04/2021 None Detected  NONE DETECTED (Cut Off Level 300 ng/mL) Final   POC Buprenorphine (BUP) 10/04/2021 None Detected  NONE DETECTED (Cut Off Level 10 ng/mL) Final   POC Oxazepam (BZO) 10/04/2021 None Detected  NONE DETECTED (Cut  Off Level 300 ng/mL) Final   POC Cocaine UR 10/04/2021 None Detected  NONE DETECTED (Cut Off Level 300 ng/mL) Final   POC Methamphetamine UR 10/04/2021 None Detected  NONE DETECTED (Cut Off Level 1000 ng/mL) Final   POC Morphine 10/04/2021 None Detected  NONE DETECTED (Cut Off Level 300 ng/mL) Final   POC Methadone UR 10/04/2021 None Detected  NONE DETECTED (Cut Off Level 300 ng/mL) Final   POC Oxycodone UR 10/04/2021 Positive (A)  NONE DETECTED (Cut Off Level 100 ng/mL) Final   POC Marijuana UR 10/04/2021 None Detected  NONE DETECTED (Cut Off Level 50 ng/mL) Final   SARSCOV2ONAVIRUS 2 AG 10/04/2021 NEGATIVE  NEGATIVE Final   Comment: (NOTE) SARS-CoV-2 antigen NOT DETECTED.   Negative results are presumptive.  Negative results do not preclude SARS-CoV-2 infection and should not be used as the sole basis for treatment or other patient management decisions, including infection  control decisions, particularly in the presence of clinical signs and  symptoms consistent with COVID-19, or in those who  have been in contact with the virus.  Negative results must be combined with clinical observations, patient history, and epidemiological information. The expected result is Negative.  Fact Sheet for Patients: https://www.jennings-kim.com/  Fact Sheet for Healthcare Providers: https://alexander-rogers.biz/  This test is not yet approved or cleared by the [place] FDA and  has been authorized for detection and/or diagnosis of SARS-CoV-2 by FDA under an Emergency Use Authorization (EUA).  This EUA will remain in effect (meaning this test can be used) for the duration of  the COV                          ID-19 declaration under Section 564(b)(1) of the Act, 21 U.S.C. section 360bbb-3(b)(1), unless the authorization is terminated or revoked sooner.     Valproic Acid Lvl 10/04/2021 51  50.0 - 100.0 ug/mL Final   Performed at [facility], [address] [place], [place] [address]   Valproic Acid Lvl 10/08/2021 57  50.0 - 100.0 ug/mL Final   Performed at [facility], [address], [place], [place] [address]   Glucose-Capillary 10/09/2021 123 (H)  70 - 99 mg/dL Final   Glucose reference range applies only to samples taken after fasting for at least 8 hours.  There may be more visits with results that are not included.    Blood Alcohol level:  Lab Results  Component Value Date   ETH <10 11/23/2021   ETH <10 11/08/2021    Metabolic Disorder Labs: Lab Results  Component Value Date   HGBA1C 6.7 (H) 12/28/2021   MPG 145.59 12/28/2021   MPG 154.2 10/04/2021   Lab Results  Component Value Date   PROLACTIN 3.8 (L) 11/23/2021   Lab Results  Component Value Date   CHOL 171 11/23/2021   TRIG 125 11/23/2021   HDL 65 11/23/2021   CHOLHDL 2.6 11/23/2021   VLDL 25 11/23/2021   LDLCALC 81 11/23/2021   LDLCALC 95 10/04/2021    Therapeutic Lab Levels: No results found for: "LITHIUM" Lab Results  Component Value Date   VALPROATE 17 (L)  02/17/2022   VALPROATE 33 (L) 01/18/2022   No results found for: "CBMZ"  Physical Findings   PHQ2-9    Flowsheet Row ED from 02/18/2022 in [facility] ED from 11/23/2021 in [facility]  PHQ-2 Total Score 0 2  PHQ-9 Total Score -- 3  [facility]  C-SSRS RISK CATEGORY No Risk No Risk No Risk        Musculoskeletal  Strength & Muscle Tone: within normal limits Gait & Station: normal Patient leans: N/A  Psychiatric Specialty Exam  Presentation  General Appearance:  Appropriate for Environment  Eye Contact: Good  Speech: Clear and Coherent  Speech Volume: Normal  Handedness: Right   Mood and Affect  Mood: Euthymic  Affect: Appropriate   Thought Process  Thought Processes: Coherent  Descriptions of Associations:Intact  Orientation:Full (Time, Place and Person)  Thought Content:Logical  Diagnosis of Schizophrenia or Schizoaffective disorder in past: Yes  Duration of Psychotic Symptoms: No data recorded  Hallucinations:Hallucinations: None  Ideas of Reference:None  Suicidal Thoughts:Suicidal Thoughts: No  Homicidal Thoughts:Homicidal Thoughts: No   Sensorium  Memory: Immediate Good  Judgment: Good  Insight: Good   Executive Functions  Concentration: Fair  Attention Span: Fair  Recall: Fair  Fund of Knowledge: Fair  Language: Good   Psychomotor Activity  Psychomotor Activity: Psychomotor Activity: Normal   Assets  Assets: Communication Skills; Desire for Improvement; Financial Resources/Insurance; Resilience   Sleep  Sleep: Sleep: Good Number of Hours of Sleep: 8   Nutritional Assessment (For OBS and FBC admissions only) Has the patient had a weight loss  or gain of 10 pounds or more in the last 3 months?: No Has the patient had a decrease in food intake/or appetite?: No Does the patient have dental problems?: No Does the patient have eating habits or behaviors that may be indicators of an eating disorder including binging or inducing vomiting?: No Has the patient recently lost weight without trying?: 0 Has the patient been eating poorly because of a decreased appetite?: 0 Malnutrition Screening Tool Score: 0    Physical Exam  Physical Exam Vitals and nursing note reviewed.  Constitutional:      Appearance: Normal appearance.  HENT:     Head: Normocephalic and atraumatic.     Right Ear: Tympanic membrane and ear canal normal.     Left Ear: Tympanic membrane normal.     Nose: Nose normal.  Eyes:     Extraocular Movements: Extraocular movements intact.     Pupils: Pupils are equal, round, and reactive to light.  Cardiovascular:     Rate and Rhythm: Normal rate and regular rhythm.  Pulmonary:     Effort: Pulmonary effort is normal.     Breath sounds: Normal breath sounds.  Abdominal:     General: Bowel sounds are normal.  Musculoskeletal:        General: Normal range of motion.     Cervical back: Normal range of motion.  Skin:    General: Skin is warm.  Neurological:     General: No focal deficit present.     Mental Status: She is alert and oriented to person, place, and time.  Psychiatric:        Mood and Affect: Mood normal.        Behavior: Behavior normal.        Thought Content: Thought content normal.        Judgment: Judgment normal.    Review of Systems  Constitutional: Negative.   HENT: Negative.    Eyes: Negative.   Respiratory: Negative.    Cardiovascular: Negative.   Gastrointestinal: Negative.   Genitourinary: Negative.  Musculoskeletal: Negative.   Skin: Negative.   Neurological: Negative.   Endo/Heme/Allergies: Negative.   Psychiatric/Behavioral: Negative.    Blood pressure 128/77, pulse 86,  temperature 98.2 F (36.8 C), temperature source Oral, resp. rate 20, SpO2 95 %. There is no height or weight on file to calculate BMI.  Treatment Plan Summary: Daily contact with patient to assess and evaluate symptoms and progress in treatment and Medication management  Continue with current medication regimen Continue safety monitoring protocol  Olin Pia, NP 03/13/2022 9:54 AM

## 2022-03-13 NOTE — ED Notes (Signed)
Pt sleeping@this time. Breathing even and unlabored. Will continue to monitor for safety 

## 2022-03-13 NOTE — ED Notes (Signed)
Pt sleeping at present, no distress noted.  Monitoring for safety. 

## 2022-03-14 DIAGNOSIS — F209 Schizophrenia, unspecified: Secondary | ICD-10-CM | POA: Diagnosis not present

## 2022-03-14 LAB — GLUCOSE, CAPILLARY
Glucose-Capillary: 131 mg/dL — ABNORMAL HIGH (ref 70–99)
Glucose-Capillary: 196 mg/dL — ABNORMAL HIGH (ref 70–99)
Glucose-Capillary: 207 mg/dL — ABNORMAL HIGH (ref 70–99)

## 2022-03-14 MED ORDER — CLOZAPINE 25 MG PO TABS: 75.0000 mg | ORAL_TABLET | Freq: Two times a day (BID) | ORAL | 0 refills | Status: AC

## 2022-03-14 MED ORDER — ROSUVASTATIN CALCIUM 20 MG PO TABS: 20.0000 mg | ORAL_TABLET | Freq: Every day | ORAL | 0 refills | Status: AC

## 2022-03-14 NOTE — Discharge Instructions (Addendum)
Take all medications as prescribed. Keep all follow-up appointments as scheduled.  Do not consume alcohol or use illegal drugs while on prescription medications. Report any adverse effects from your medications to your primary care provider promptly.  In the event of recurrent symptoms or worsening symptoms, call 911, a crisis hotline, or go to the nearest emergency department for evaluation.   

## 2022-03-14 NOTE — ED Notes (Signed)
Patient sitting in bed quietly, no distress noted, will continue to monitor patient for safety

## 2022-03-14 NOTE — ED Notes (Signed)
Patient is watching tv. Alert and oriented. Will continue to monitor for safety. 

## 2022-03-14 NOTE — ED Notes (Signed)
Patient  resting in no acute stress..Environment secured .Will continue to monitor for safely. 

## 2022-03-14 NOTE — ED Notes (Signed)
Patient observed/assessed in bed/chair resting quietly appearing in no distress and verbalizing no complaints at this time. Will continue to monitor.  

## 2022-03-14 NOTE — ED Notes (Signed)
Patient alert and oriented x 3. Denies SI/HI/AVH. Denies intent or plan to harm self or others. Routine conducted according to faculty protocol. Encourage patient to notify staff with any needs or concerns. Patient verbalized agreement and understanding. Will continue to monitor for safety. 

## 2022-03-14 NOTE — ED Notes (Signed)
Patient  sleeping in no acute stress. RR even and unlabored .Environment secured .Will continue to monitor for safely. 

## 2022-03-14 NOTE — ED Notes (Signed)
Patients blood sugar was 200 at 1129. Blood sugar did not cross over . Blood sugar was in the machine.

## 2022-03-14 NOTE — ED Provider Notes (Signed)
FBC/OBS ASAP Discharge Summary  Date and Time: 03/14/2022 10:50 AM  Name: Teresa Murillo  MRN:  [id number]   Discharge Diagnoses:  Final diagnoses:  Schizophrenia, unspecified type [facility])    HPI: Teresa Murillo is a 62 y/o female with PMH of schizophrenia, borderline intellectual functioning, tobacco use d/o, HTN, HLD, DM2 (insulin-dependent), atrial flutter, COPD, admitted to [facility] on 11/23/21 voluntarily, accompanied by [employer] and reported that she was locked out of her group home. It was later confirmed that Teresa Murillo was dismissed from group home due to recurrent history of eloping from the group home. Teresa Murillo has been boarding at this facility until placement is secured.    Stay Summary:Teresa Murillo was admitted for schizophrenia and crisis management. Patient has been at [facility] urgent care for 600 hours awaiting placement.  She was treated with the following medications Abilify, Clozaril, haldol and Ingrezza.  Teresa Murillo was discharged with current medication and was instructed on how to take medications as prescribed; (details listed below under Medication List).  Medical problems were identified and treated as needed.  Home medications were restarted as appropriate.  Improvement was monitored by observation and Teresa Murillo daily report of symptom reduction.  Emotional and mental status was monitored by daily self-inventory reports completed by Teresa Murillo and clinical staff.         Teresa Murillo was evaluated by the treatment team for stability and plans for continued recovery upon discharge.  [facility] motivation was an integral factor for scheduling further treatment.  Employment, transportation, bed availability, health status, family support, and any pending legal issues were also considered during her hospital stay. She was offered further treatment options upon discharge including but not limited to Residential, Intensive Outpatient, and Outpatient treatment.   Teresa Murillo will follow up with the services as listed below under Follow Up Information.     Upon completion of this admission the [facility] was both mentally and medically stable for discharge denying suicidal/homicidal ideation, auditory/visual/tactile hallucinations, delusional thoughts and paranoia.      Total Time spent with patient: 15 minutes  Past Psychiatric History:  Past Medical History:  Past Medical History:  Diagnosis Date   Borderline intellectual functioning 12/14/2021   On 12/14/2021: Appreciate assistance from psychology consult. On the Wechsler Adult Intelligence Scale-4, Ms. Enrico achieved a full-scale IQ score of 73 and a percentile rank of 4 placing her in the borderline range of intellectual functioning.    Chronic obstructive pulmonary disease (COPD) (HCC)    Glaucoma    Hyperlipidemia    Hypertension    Iron deficiency    Schizoaffective disorder (HCC)    Type 2 diabetes mellitus (HCC)     Past Surgical History:  Procedure Laterality Date   TUBAL LIGATION     Family History:  Family History  Problem Relation Age of Onset   Breast cancer Maternal Grandmother    Family Psychiatric History:  Social History:  Social History   Substance and Sexual Activity  Alcohol Use Not Currently     Social History   Substance and Sexual Activity  Drug Use Not Currently    Social History   Socioeconomic History   Marital status: Divorced    Spouse name: Not on file   Number of children: Not on file   Years of education: Not on file   Highest education level: Not on file  Occupational History   Not on file  Tobacco Use   Smoking status: Former    Packs/day: 1.00  Types: Cigars, Cigarettes    Quit date: 11/09/2021    Years since quitting: 0.3   Smokeless tobacco: Current  Vaping Use   Vaping Use: Never used  Substance and Sexual Activity   Alcohol use: Not Currently   Drug use: Not Currently   Sexual activity: Not Currently    Birth  control/protection: Surgical  Other Topics Concern   Not on file  Social History Narrative   Not on file   Social Determinants of Health   Financial Resource Strain: Not on file  Food Insecurity: Not on file  Transportation Needs: Not on file  Physical Activity: Not on file  Stress: Not on file  Social Connections: Not on file   SDOH:  SDOH Screenings   Depression (PHQ2-9): Low Risk  (03/13/2022)  Tobacco Use: High Risk (03/08/2022)    Tobacco Cessation:  A prescription for an FDA-approved tobacco cessation medication provided at discharge  Current Medications:  Current Facility-Administered Medications  Medication Dose Route Frequency Provider Last Rate Last Admin   acetaminophen (TYLENOL) tablet 650 mg  650 mg Oral Q6H PRN Sindy Guadeloupe, NP   650 mg at 03/13/22 2120   albuterol (VENTOLIN HFA) 108 (90 Base) MCG/ACT inhaler 2 puff  2 puff Inhalation Q6H PRN Sindy Guadeloupe, NP   2 puff at 02/20/22 1944   alum & mag hydroxide-simeth (MAALOX/MYLANTA) 200-200-20 MG/5ML suspension 30 mL  30 mL Oral Q4H PRN Sindy Guadeloupe, NP   30 mL at 03/07/22 2155   apixaban (ELIQUIS) tablet 5 mg  5 mg Oral BID Sindy Guadeloupe, NP   5 mg at 03/14/22 0919   ARIPiprazole (ABILIFY) tablet 10 mg  10 mg Oral Daily Sindy Guadeloupe, NP   10 mg at 03/14/22 0919   cloZAPine (CLOZARIL) tablet 75 mg  75 mg Oral BID Park Pope, MD   75 mg at 03/14/22 9622   colchicine tablet 0.6 mg  0.6 mg Oral QHS Sindy Guadeloupe, NP   0.6 mg at 03/13/22 2121   diltiazem (CARDIZEM CD) 24 hr capsule 240 mg  240 mg Oral Daily Sindy Guadeloupe, NP   240 mg at 03/14/22 0919   divalproex (DEPAKOTE ER) 24 hr tablet 500 mg  500 mg Oral BID Sindy Guadeloupe, NP   500 mg at 03/14/22 0919   haloperidol (HALDOL) tablet 10 mg  10 mg Oral TID PRN Sindy Guadeloupe, NP       insulin aspart (novoLOG) injection 0-9 Units  0-9 Units Subcutaneous TID WC Park Pope, MD   1 Units at 03/14/22 0740   insulin glargine-yfgn (SEMGLEE) injection 12 Units  12 Units  Subcutaneous Daily Sindy Guadeloupe, NP   12 Units at 03/14/22 0920   latanoprost (XALATAN) 0.005 % ophthalmic solution 1 drop  1 drop Both Eyes QHS Sindy Guadeloupe, NP   1 drop at 03/13/22 2121   magnesium hydroxide (MILK OF MAGNESIA) suspension 30 mL  30 mL Oral Daily PRN Sindy Guadeloupe, NP       metFORMIN (GLUCOPHAGE) tablet 500 mg  500 mg Oral BID WC Sindy Guadeloupe, NP   500 mg at 03/14/22 0740   mometasone-formoterol (DULERA) 200-5 MCG/ACT inhaler 2 puff  2 puff Inhalation BID Sindy Guadeloupe, NP   2 puff at 03/14/22 0740   multivitamins with iron tablet 1 tablet  1 tablet Oral QHS Princess Bruins, DO   1 tablet at 03/13/22 2120   nicotine (NICODERM CQ - dosed in mg/24 hr) patch 7 mg  7 mg Transdermal Daily Sindy Guadeloupe, NP  7 mg at 03/14/22 0919   rosuvastatin (CRESTOR) tablet 20 mg  20 mg Oral QHS [name], DO   20 mg at 03/13/22 2120   valbenazine (INGREZZA) capsule 40 mg  40 mg Oral q AM [name], NP   40 mg at 03/14/22 0618   Vitamin D (Ergocalciferol) (DRISDOL) 1.25 MG (50000 UNIT) capsule 50,000 Units  50,000 Units Oral Q Leonie Green, NP   50,000 Units at 03/11/22 1308   Current Outpatient Medications  Medication Sig Dispense Refill   Accu-Chek Softclix Lancets lancets Use as directed up to four times daily 100 each 0   apixaban (ELIQUIS) 5 MG TABS tablet Take 1 tablet (5 mg total) by mouth 2 (two) times daily. 60 tablet 0   ARIPiprazole (ABILIFY) 10 MG tablet Take 1 tablet (10 mg total) by mouth daily. 30 tablet 0   Blood Glucose Monitoring Suppl (ACCU-CHEK GUIDE) w/Device KIT Use as directed up to four times daily 1 kit 0   budesonide-formoterol (SYMBICORT) 160-4.5 MCG/ACT inhaler Inhale 2 puffs into the lungs in the morning and at bedtime.     Cholecalciferol (VITAMIN D3) 1.25 MG (50000 UT) CAPS Take 50,000 Units by mouth every Thursday.     cloZAPine (CLOZARIL) 25 MG tablet Take 3 tablets (75 mg total) by mouth 2 (two) times daily. 90 tablet 0   colchicine 0.6 MG tablet  Take 1 tablet (0.6 mg total) by mouth at bedtime. 30 tablet 0   diltiazem (CARDIZEM CD) 240 MG 24 hr capsule Take 1 capsule (240 mg total) by mouth daily. (Patient not taking: Reported on 11/24/2021) 30 capsule 0   divalproex (DEPAKOTE ER) 500 MG 24 hr tablet Take 1 tablet (500 mg total) by mouth 2 (two) times daily. 60 tablet 0   glucose blood test strip Use as directed up to four times daily 50 each 0   haloperidol (HALDOL) 10 MG tablet Take 1 tablet (10 mg total) by mouth 3 (three) times daily as needed for agitation (and psychotic symptoms).     INGREZZA 40 MG capsule Take 1 capsule (40 mg total) by mouth in the morning. 30 capsule 0   insulin aspart (NOVOLOG) 100 UNIT/ML FlexPen Before each meal 3 times a day, 140-199 - 2 units, 200-250 - 4 units, 251-299 - 6 units,  300-349 - 8 units,  350 or above 10 units. Insulin PEN if approved, provide syringes and needles if needed.Please switch to any approved short acting Insulin if needed. 15 mL 0   insulin glargine (LANTUS) 100 UNIT/ML Solostar Pen Inject 12 Units into the skin daily. 15 mL 0   Insulin Pen Needle 32G X 4 MM MISC Use 4 times a day with insulin, 1 month supply. 100 each 0   latanoprost (XALATAN) 0.005 % ophthalmic solution Place 1 drop into both eyes at bedtime.     metFORMIN (GLUCOPHAGE) 500 MG tablet Take 500 mg by mouth 2 (two) times daily with a meal.     nicotine (NICODERM CQ - DOSED IN MG/24 HR) 7 mg/24hr patch Place 1 patch (7 mg total) onto the skin daily. 28 patch 0   PROAIR HFA 108 (90 Base) MCG/ACT inhaler Inhale 2 puffs into the lungs every 6 (six) hours as needed for wheezing or shortness of breath.     rosuvastatin (CRESTOR) 20 MG tablet Take 1 tablet (20 mg total) by mouth at bedtime. 30 tablet 0    PTA Medications: (Not in a hospital admission)  03/13/2022    9:53 AM 11/23/2021    6:44 PM  Depression screen PHQ 2/9  Decreased Interest 0 1  Down, Depressed, Hopeless 0 1  PHQ - 2 Score 0 2  Altered sleeping  0   Tired, decreased energy  1  Change in appetite  0  Feeling bad or failure about yourself   0  Trouble concentrating  0  Moving slowly or fidgety/restless  0  Suicidal thoughts  0  PHQ-9 Score  3  Difficult doing work/chores  Somewhat difficult    Flowsheet Row ED from 02/18/2022 in [facility] ED from 02/17/2022 in [facility] EMERGENCY DEPARTMENT ED from 02/09/2022 in [facility]  C-SSRS RISK CATEGORY No Risk No Risk No Risk       Musculoskeletal  Strength & Muscle Tone: within normal limits Gait & Station: normal Patient leans: N/A  Psychiatric Specialty Exam  Presentation  General Appearance:  Appropriate for Environment  Eye Contact: Good  Speech: Clear and Coherent  Speech Volume: Normal  Handedness: Right   Mood and Affect  Mood: Euthymic  Affect: Appropriate   Thought Process  Thought Processes: Coherent  Descriptions of Associations:Intact  Orientation:Full (Time, Place and Person)  Thought Content:Logical  Diagnosis of Schizophrenia or Schizoaffective disorder in past: Yes  Duration of Psychotic Symptoms: No data recorded  Hallucinations:Hallucinations: None  Ideas of Reference:None  Suicidal Thoughts:Suicidal Thoughts: No  Homicidal Thoughts:Homicidal Thoughts: No   Sensorium  Memory: Immediate Good  Judgment: Good  Insight: Good   Executive Functions  Concentration: Fair  Attention Span: Fair  Recall: Fair  Fund of Knowledge: Fair  Language: Good   Psychomotor Activity  Psychomotor Activity: Psychomotor Activity: Normal   Assets  Assets: Communication Skills; Desire for Improvement; Financial Resources/Insurance; Resilience   Sleep  Sleep: Sleep: Good Number of Hours of Sleep: 8   Nutritional Assessment (For OBS and FBC admissions only) Has the patient had a weight loss or gain of 10 pounds or more in the last 3  months?: No Has the patient had a decrease in food intake/or appetite?: No Does the patient have dental problems?: No Does the patient have eating habits or behaviors that may be indicators of an eating disorder including binging or inducing vomiting?: No Has the patient recently lost weight without trying?: 0 Has the patient been eating poorly because of a decreased appetite?: 0 Malnutrition Screening Tool Score: 0    Physical Exam  Physical Exam Vitals and nursing note reviewed.  Cardiovascular:     Rate and Rhythm: Normal rate and regular rhythm.  Neurological:     Mental Status: She is alert and oriented to person, place, and time.  Psychiatric:        Mood and Affect: Mood normal.        Thought Content: Thought content normal.    Review of Systems  Respiratory: Negative.    Cardiovascular: Negative.   Gastrointestinal: Negative.   Psychiatric/Behavioral:  Negative for depression, hallucinations and suicidal ideas. The patient is not nervous/anxious.   All other systems reviewed and are negative.  Blood pressure 132/80, pulse (!) 107, temperature 97.7 F (36.5 C), resp. rate 18, SpO2 97 %. There is no height or weight on file to calculate BMI.  Demographic Factors:  Low socioeconomic status  Loss Factors: Financial problems/change in socioeconomic status  Historical Factors: Impulsivity  Risk Reduction Factors:   Positive social support and Positive therapeutic relationship  Continued Clinical Symptoms:  Depression:   Impulsivity  Cognitive Features That Contribute To Risk:  Closed-mindedness    Suicide Risk:  Minimal: No identifiable suicidal ideation.  Patients presenting with no risk factors but with morbid ruminations; may be classified as minimal risk based on the severity of the depressive symptoms  Plan Of Care/Follow-up recommendations:  Activity:  as tolerated Diet:  heart healthy  Disposition: Take all of you medications as prescribed by your  mental healthcare provider.  Report any adverse effects and reactions from your medications to your outpatient provider promptly.  Do not engage in alcohol and or illegal drug use while on prescription medicines. Keep all scheduled appointments. This is to ensure that you are getting refills on time and to avoid any interruption in your medication.  If you are unable to keep an appointment call to reschedule.  Be sure to follow up with resources and follow ups given. In the event of worsening symptoms call the crisis hotline, 911, and or go to the nearest emergency department for appropriate evaluation and treatment of symptoms. Follow-up with your primary care provider for your medical issues, concerns and or health care needs.    Oneta Rack, NP 03/14/2022, 10:50 AM

## 2022-03-14 NOTE — ED Notes (Signed)
Pt sleeping at present, no distress noted.  Monitoring for safety. 

## 2022-03-14 NOTE — ED Notes (Addendum)
Patient  sleeping in no acute stress. RR even and unlabored .Environment secured .Will continue to monitor for safely. 

## 2022-03-15 DIAGNOSIS — F209 Schizophrenia, unspecified: Secondary | ICD-10-CM | POA: Diagnosis not present

## 2022-03-15 LAB — GLUCOSE, CAPILLARY
Glucose-Capillary: 200 mg/dL — ABNORMAL HIGH (ref 70–99)
Glucose-Capillary: 113 mg/dL — ABNORMAL HIGH (ref 70–99)
Glucose-Capillary: 129 mg/dL — ABNORMAL HIGH (ref 70–99)

## 2022-03-15 NOTE — ED Notes (Signed)
Patient awoke with complaint of indigestion and asked for Maalox

## 2022-03-15 NOTE — Care Management (Signed)
Writer spoke to the [employer] with [employer]  Rachael confirms that she will be able to pick up the patient today between 1pm and 3pm.  Writer informed the patient, NP and the RN working with the patient.

## 2022-03-15 NOTE — ED Notes (Signed)
Patient observed/assessed in bed/chair resting quietly appearing in no distress and verbalizing no complaints at this time. Will continue to monitor.  

## 2022-03-15 NOTE — ED Notes (Signed)
Pt resting quietly, breathing is even and unlabored.  Pt denies SI, HI, pain and AVH.  Will continue to monitor for safety.  

## 2022-03-22 NOTE — H&P (Deleted)
Name: Teresa Murillo MRN: [id number] DOB: 08-Jun-1960 62 y.o. PCP: Ileene Hutchinson, PA  Date of Admission: 02/08/2022  3:25 PM Date of Discharge: 02/09/22 Attending Physician: Dr. Angelia Mould  Discharge Diagnosis: Principal Problem:   Atypical chest pain Active Problems:   Pericardial effusion   Tachycardia    Discharge Medications: Allergies as of 02/09/2022       Reactions   Penicillins Other (See Comments)   Reaction not known at group home   Penicillin G Other (See Comments)        Medication List     STOP taking these medications    cloZAPine 25 MG tablet Commonly known as: CLOZARIL       TAKE these medications    Accu-Chek Guide test strip Generic drug: glucose blood Use as directed up to four times daily   Accu-Chek Guide w/Device Kit Use as directed up to four times daily   Accu-Chek Softclix Lancets lancets Use as directed up to four times daily   apixaban 5 MG Tabs tablet Commonly known as: ELIQUIS Take 1 tablet (5 mg total) by mouth 2 (two) times daily.   ARIPiprazole 10 MG tablet Commonly known as: ABILIFY Take 1 tablet (10 mg total) by mouth daily.   BD Pen Needle Nano U/F 32G X 4 MM Misc Generic drug: Insulin Pen Needle Use 4 times a day with insulin, 1 month supply.   Cartia XT 240 MG 24 hr capsule Generic drug: diltiazem Take 1 capsule (240 mg total) by mouth daily.   colchicine 0.6 MG tablet Take 1 tablet (0.6 mg total) by mouth at bedtime.   divalproex 500 MG 24 hr tablet Commonly known as: DEPAKOTE ER Take 1 tablet (500 mg total) by mouth 2 (two) times daily.   haloperidol 10 MG tablet Commonly known as: HALDOL Take 1 tablet (10 mg total) by mouth 3 (three) times daily as needed for agitation (and psychotic symptoms).   Ingrezza 40 MG capsule Generic drug: valbenazine Take 1 capsule (40 mg total) by mouth in the morning.   Lantus SoloStar 100 UNIT/ML Solostar Pen Generic drug: insulin glargine Inject 12 Units into  the skin daily.   latanoprost 0.005 % ophthalmic solution Commonly known as: XALATAN Place 1 drop into both eyes at bedtime.   metFORMIN 500 MG tablet Commonly known as: GLUCOPHAGE Take 500 mg by mouth 2 (two) times daily with a meal.   nicotine 7 mg/24hr patch Commonly known as: NICODERM CQ - dosed in mg/24 hr Place 1 patch (7 mg total) onto the skin daily.   NovoLOG FlexPen 100 UNIT/ML FlexPen Generic drug: insulin aspart Before each meal 3 times a day, 140-199 - 2 units, 200-250 - 4 units, 251-299 - 6 units,  300-349 - 8 units,  350 or above 10 units. Insulin PEN if approved, provide syringes and needles if needed.Please switch to any approved short acting Insulin if needed.   ProAir HFA 108 (90 Base) MCG/ACT inhaler Generic drug: albuterol Inhale 2 puffs into the lungs every 6 (six) hours as needed for wheezing or shortness of breath.   Symbicort 160-4.5 MCG/ACT inhaler Generic drug: budesonide-formoterol Inhale 2 puffs into the lungs in the morning and at bedtime.   Vitamin D3 1.25 MG (50000 UT) Caps Take 50,000 Units by mouth every Thursday.        Disposition and follow-up:   [name] was discharged from [facility] in Stable condition.  At the hospital follow up visit please address:  1.  Follow-up:  a. Chest pain - resolved overnight with negative EKG and normal trops. Likely non-cardiac. Ensure no further recurrence.     b. Pericardial effusion - ensure hemodynamic stability    c. GERD - pain after meals. started pantoprazole  D. Cough - hx smoking. Consider PFTs  E. Schizophrenia - [facility] follow up   2.  Labs / imaging needed at time of follow-up: none  3.  Pending labs/ test needing follow-up: none  4.  Medication Changes  Started: pantoprazole  Stopped: none  Changed: none  Follow-up Appointments:  Follow-up Information     Robert Lee, [place], [place] Follow up in 1 week(s).   Specialty: Physician Assistant Contact  information: [place] [address] [phone]                 Hospital Course by problem list:  Patient with hx of COPD, HTN, HLD, PAF, and hx of pericardial effusion presented for evaluation of chest pani. She described the pain as nonexertional, constant, and substernal without alleviating factors. EKG was without concerning findings and troponins were negative. She was monitored on telemetry without incidence. Patient had had two recent CTA PE studies for similar presentation which were both negative so this was not pursued. She did have hx of pericardial effusion so ECHO was performed that showed persistence but largely stable effusion. Her chest pain was thought to be non-cardiac in nature. Additionally, patient reported that pain had resolved overnight. She was felt stable for discharge back to [facility] where she was long-term resident.    Discharge Subjective: Chest pain resolved. No complaints.   Discharge Exam:   BP (!) 137/92 (BP Location: Right Arm)   Pulse (!) 110   Temp 98 F (36.7 C) (Oral)   Resp 20   Ht 5\' 2"  (1.575 m)   Wt 80.7 kg   SpO2 100%   BMI 32.54 kg/m  Gen: No acute distress, laying in bed pleasant and cooperative HEENT: No scleral icterus, no conjunctival pallor YI:RSWNIOEVOJJ, regular rhythm, normal s1/s2, no m/r/g, 2+bilateral radial pulses Pulm:Normal wob, LCTAB KKX:FGHW,EX, epigastric ptp, no rebound or guarding, no palpable masses Extremities: warm, dry, no LE edema Psych: A&Ox4.Patient appears euthymic with restricted range of affect. Speech is normal in tone and volume, hyperverbal but intermittently interruptable at increased rate, tangential with loose associations. Paranoid ideation. No apparent hallucinations or response to internal stimuli. No odd behavior and redirectable.Does not appear to have great insight into medical care.No SI/HI  Discharge Instructions: Discharge Instructions     Call MD for:  difficulty breathing,  headache or visual disturbances   Complete by: As directed    Call MD for:  extreme fatigue   Complete by: As directed    Call MD for:  hives   Complete by: As directed    Call MD for:  persistant dizziness or light-headedness   Complete by: As directed    Call MD for:  persistant nausea and vomiting   Complete by: As directed    Call MD for:  redness, tenderness, or signs of infection (pain, swelling, redness, odor or green/yellow discharge around incision site)   Complete by: As directed    Call MD for:  severe uncontrolled pain   Complete by: As directed    Call MD for:  temperature >100.4   Complete by: As directed    Diet - low sodium heart healthy   Complete by: As directed    Increase activity slowly   Complete by:  As directed        Signed: Adron Bene, MD 03/22/2022, 11:45 AM   Pager: [phone]

## 2022-03-22 NOTE — Discharge Summary (Signed)
Name: Teresa Murillo MRN: [id number] DOB: 08-Jun-1960 62 y.o. PCP: Ileene Hutchinson, PA  Date of Admission: 02/08/2022  3:25 PM Date of Discharge: 02/09/22 Attending Physician: Dr. Angelia Mould  Discharge Diagnosis: Principal Problem:   Atypical chest pain Active Problems:   Pericardial effusion   Tachycardia    Discharge Medications: Allergies as of 02/09/2022       Reactions   Penicillins Other (See Comments)   Reaction not known at group home   Penicillin G Other (See Comments)        Medication List     STOP taking these medications    cloZAPine 25 MG tablet Commonly known as: CLOZARIL       TAKE these medications    Accu-Chek Guide test strip Generic drug: glucose blood Use as directed up to four times daily   Accu-Chek Guide w/Device Kit Use as directed up to four times daily   Accu-Chek Softclix Lancets lancets Use as directed up to four times daily   apixaban 5 MG Tabs tablet Commonly known as: ELIQUIS Take 1 tablet (5 mg total) by mouth 2 (two) times daily.   ARIPiprazole 10 MG tablet Commonly known as: ABILIFY Take 1 tablet (10 mg total) by mouth daily.   BD Pen Needle Nano U/F 32G X 4 MM Misc Generic drug: Insulin Pen Needle Use 4 times a day with insulin, 1 month supply.   Cartia XT 240 MG 24 hr capsule Generic drug: diltiazem Take 1 capsule (240 mg total) by mouth daily.   colchicine 0.6 MG tablet Take 1 tablet (0.6 mg total) by mouth at bedtime.   divalproex 500 MG 24 hr tablet Commonly known as: DEPAKOTE ER Take 1 tablet (500 mg total) by mouth 2 (two) times daily.   haloperidol 10 MG tablet Commonly known as: HALDOL Take 1 tablet (10 mg total) by mouth 3 (three) times daily as needed for agitation (and psychotic symptoms).   Ingrezza 40 MG capsule Generic drug: valbenazine Take 1 capsule (40 mg total) by mouth in the morning.   Lantus SoloStar 100 UNIT/ML Solostar Pen Generic drug: insulin glargine Inject 12 Units into  the skin daily.   latanoprost 0.005 % ophthalmic solution Commonly known as: XALATAN Place 1 drop into both eyes at bedtime.   metFORMIN 500 MG tablet Commonly known as: GLUCOPHAGE Take 500 mg by mouth 2 (two) times daily with a meal.   nicotine 7 mg/24hr patch Commonly known as: NICODERM CQ - dosed in mg/24 hr Place 1 patch (7 mg total) onto the skin daily.   NovoLOG FlexPen 100 UNIT/ML FlexPen Generic drug: insulin aspart Before each meal 3 times a day, 140-199 - 2 units, 200-250 - 4 units, 251-299 - 6 units,  300-349 - 8 units,  350 or above 10 units. Insulin PEN if approved, provide syringes and needles if needed.Please switch to any approved short acting Insulin if needed.   ProAir HFA 108 (90 Base) MCG/ACT inhaler Generic drug: albuterol Inhale 2 puffs into the lungs every 6 (six) hours as needed for wheezing or shortness of breath.   Symbicort 160-4.5 MCG/ACT inhaler Generic drug: budesonide-formoterol Inhale 2 puffs into the lungs in the morning and at bedtime.   Vitamin D3 1.25 MG (50000 UT) Caps Take 50,000 Units by mouth every Thursday.        Disposition and follow-up:   [name] was discharged from [facility] in Stable condition.  At the hospital follow up visit please address:  1.  Follow-up:  a. Chest pain - resolved overnight with negative EKG and normal trops. Likely non-cardiac. Ensure no further recurrence.     b. Pericardial effusion - ensure hemodynamic stability    c. GERD - pain after meals. started pantoprazole  D. Cough - hx smoking. Consider PFTs  E. Schizophrenia - [facility] follow up   2.  Labs / imaging needed at time of follow-up: none  3.  Pending labs/ test needing follow-up: none  4.  Medication Changes  Started: pantoprazole  Stopped: none  Changed: none  Follow-up Appointments:  Follow-up Information     Robert Lee, [place], [place] Follow up in 1 week(s).   Specialty: Physician Assistant Contact  information: [place] [address] [phone]                 Hospital Course by problem list:  Patient with hx of COPD, HTN, HLD, PAF, and hx of pericardial effusion presented for evaluation of chest pani. She described the pain as nonexertional, constant, and substernal without alleviating factors. EKG was without concerning findings and troponins were negative. She was monitored on telemetry without incidence. Patient had had two recent CTA PE studies for similar presentation which were both negative so this was not pursued. She did have hx of pericardial effusion so ECHO was performed that showed persistence but largely stable effusion. Her chest pain was thought to be non-cardiac in nature. Additionally, patient reported that pain had resolved overnight. She was felt stable for discharge back to [facility] where she was long-term resident.    Discharge Subjective: Chest pain resolved. No complaints.   Discharge Exam:   BP (!) 137/92 (BP Location: Right Arm)   Pulse (!) 110   Temp 98 F (36.7 C) (Oral)   Resp 20   Ht 5\' 2"  (1.575 m)   Wt 80.7 kg   SpO2 100%   BMI 32.54 kg/m  Gen: No acute distress, laying in bed pleasant and cooperative HEENT: No scleral icterus, no conjunctival pallor YI:RSWNIOEVOJJ, regular rhythm, normal s1/s2, no m/r/g, 2+bilateral radial pulses Pulm:Normal wob, LCTAB KKX:FGHW,EX, epigastric ptp, no rebound or guarding, no palpable masses Extremities: warm, dry, no LE edema Psych: A&Ox4.Patient appears euthymic with restricted range of affect. Speech is normal in tone and volume, hyperverbal but intermittently interruptable at increased rate, tangential with loose associations. Paranoid ideation. No apparent hallucinations or response to internal stimuli. No odd behavior and redirectable.Does not appear to have great insight into medical care.No SI/HI  Discharge Instructions: Discharge Instructions     Call MD for:  difficulty breathing,  headache or visual disturbances   Complete by: As directed    Call MD for:  extreme fatigue   Complete by: As directed    Call MD for:  hives   Complete by: As directed    Call MD for:  persistant dizziness or light-headedness   Complete by: As directed    Call MD for:  persistant nausea and vomiting   Complete by: As directed    Call MD for:  redness, tenderness, or signs of infection (pain, swelling, redness, odor or green/yellow discharge around incision site)   Complete by: As directed    Call MD for:  severe uncontrolled pain   Complete by: As directed    Call MD for:  temperature >100.4   Complete by: As directed    Diet - low sodium heart healthy   Complete by: As directed    Increase activity slowly   Complete by:  As directed        Signed: Savonna Birchmeier, MD 03/22/2022, 11:45 AM   Pager: [phone]    

## 2022-05-24 ENCOUNTER — Other Ambulatory Visit (HOSPITAL_COMMUNITY): Payer: Self-pay | Admitting: Family

## 2022-11-10 IMAGING — CT CT HEAD W/O CM
4 series · 16 of 47 positions shown, 18 images · non-contrast
Comparison: None.

CLINICAL DATA: Patient status post fall hitting forehead. Hematoma.
Dizziness.

EXAM:
CT HEAD WITHOUT CONTRAST
TECHNIQUE: Contiguous axial images were obtained from the base of the skull
through the vertex without intravenous contrast.

[Series 3: head without · axial · non-contrast · 0.46mm/px · z∈[-17,+103]mm · 7 of 32 slices shown, 9 images]
[im 4/32  brain]
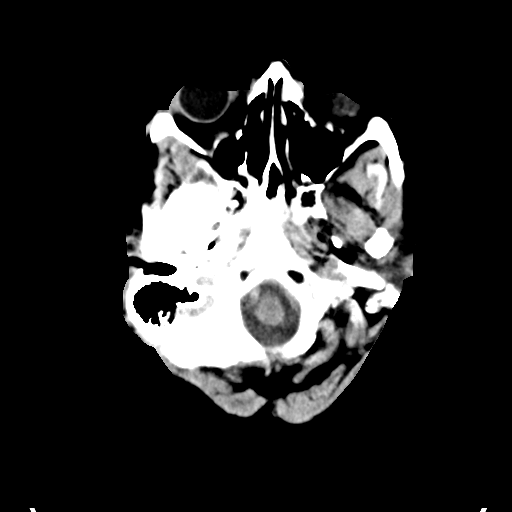
[im 4/32  bone]
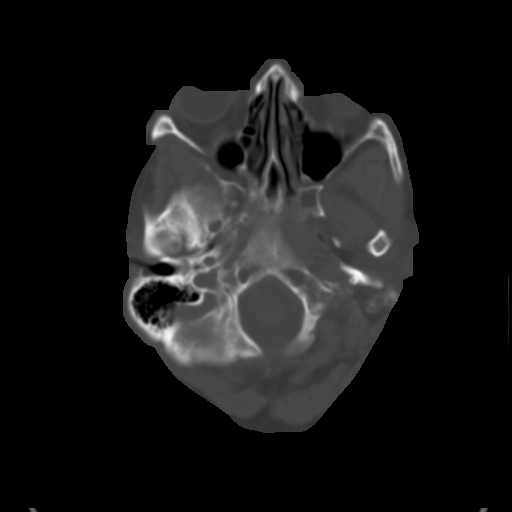
[im 8/32  brain]
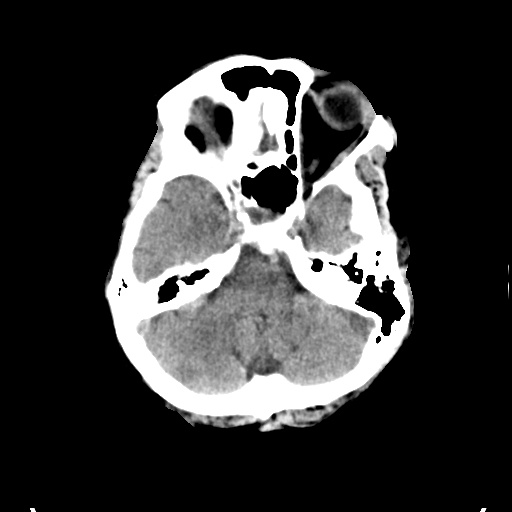
[im 12/32  brain]
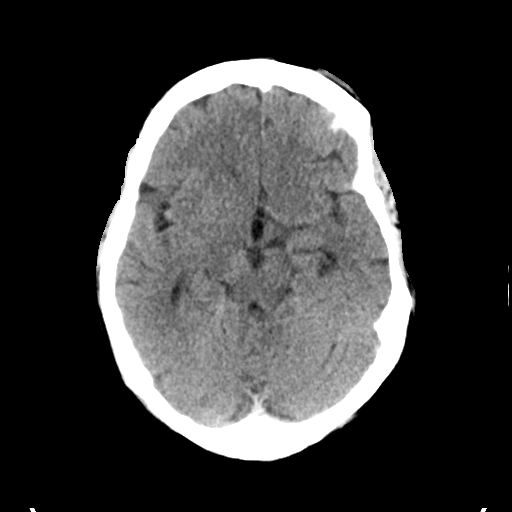
[im 16/32  brain]
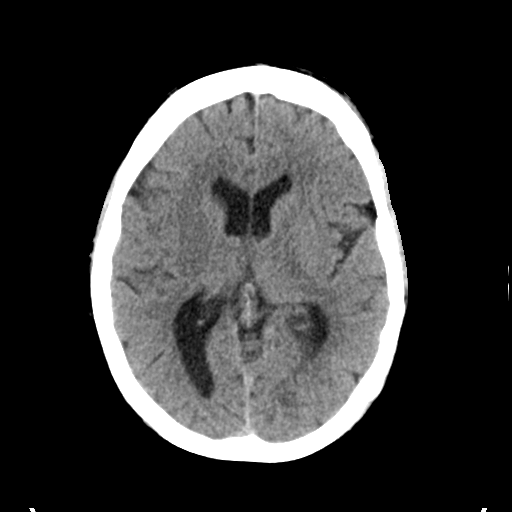
[im 20/32  brain]
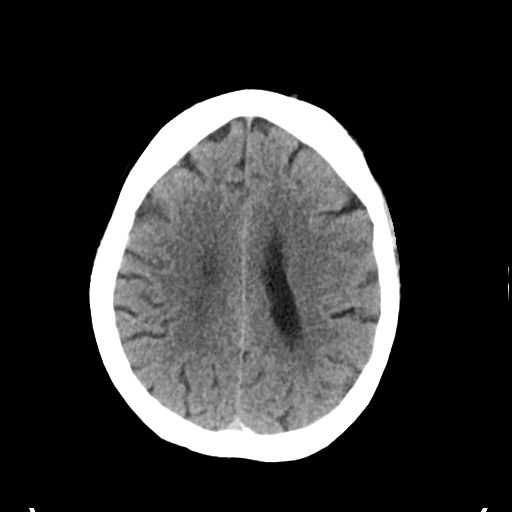
[im 20/32  bone]
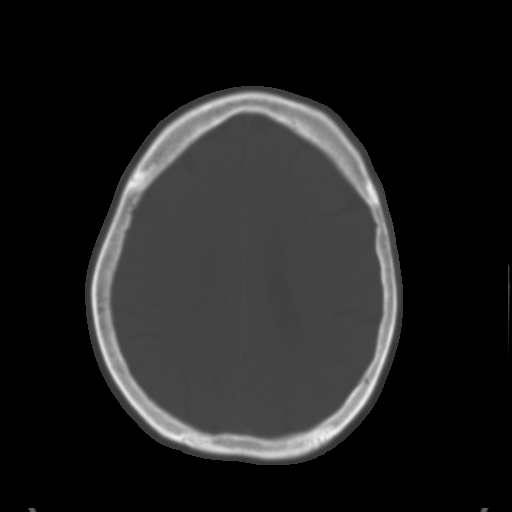
[im 24/32  brain]
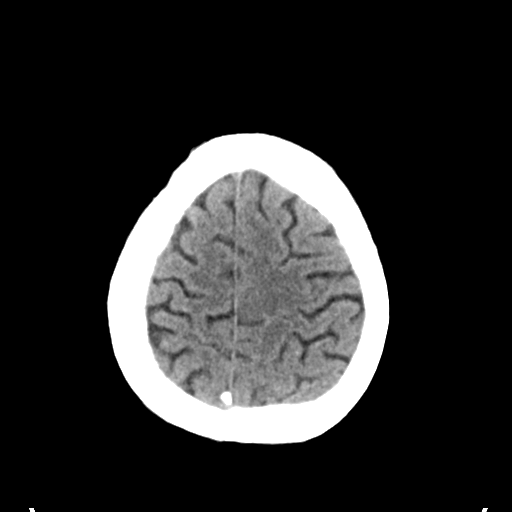
[im 28/32  brain]
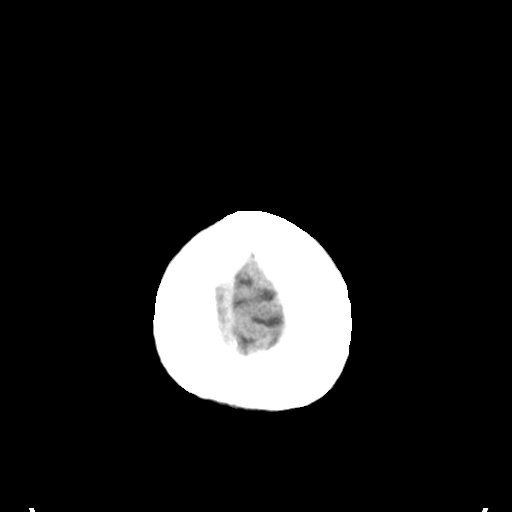

[Series 5: ax head bone · axial · 0.42mm/px · z∈[-25,+5]mm · 3 of 79 slices shown]
[im 8/79  bone]
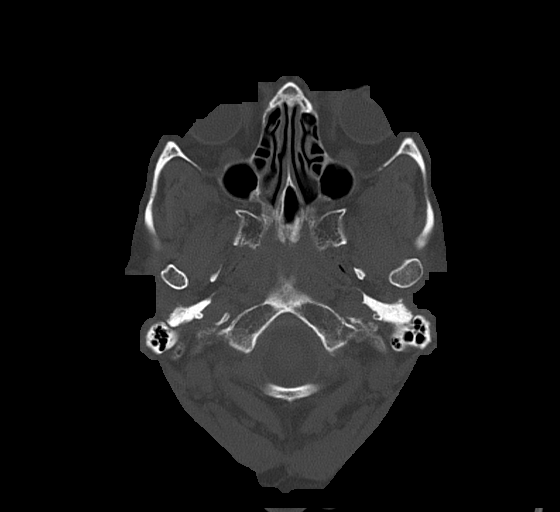
[im 16/79  bone]
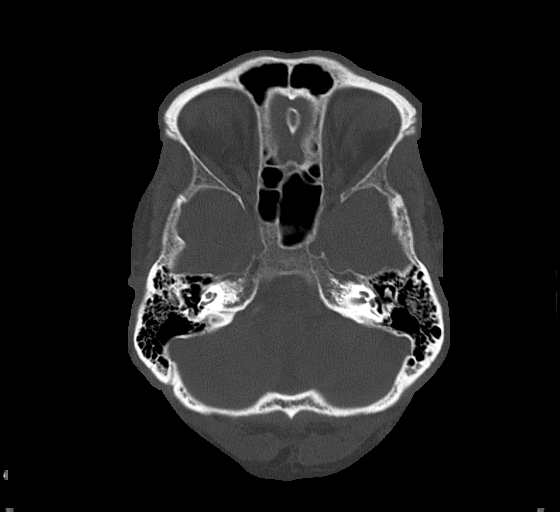
[im 24/79  bone]
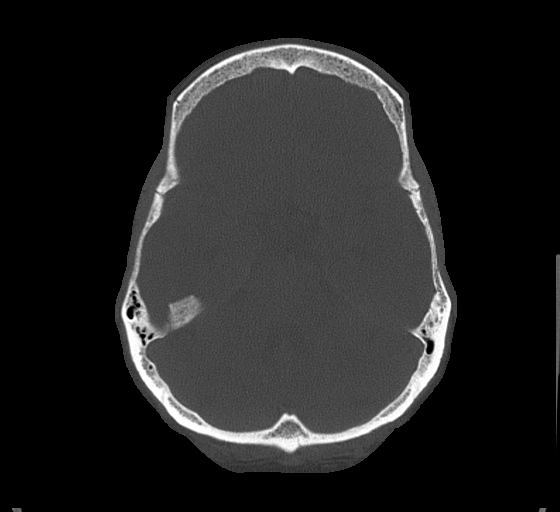

[Series 6: head without cor · coronal · non-contrast · 0.32mm/px · 3 of 71 slices shown]
[im 27/71  brain]
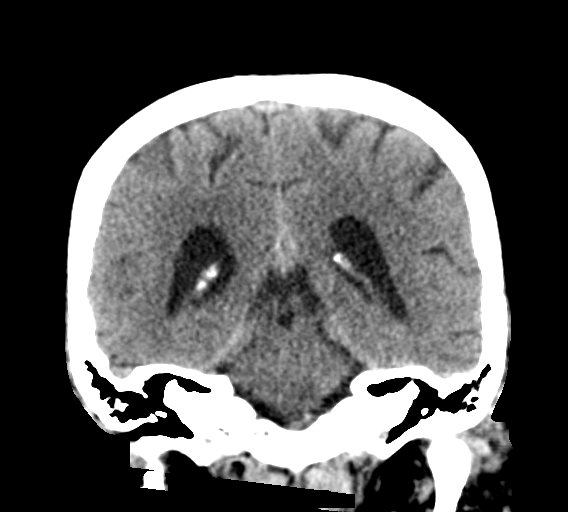
[im 33/71  brain]
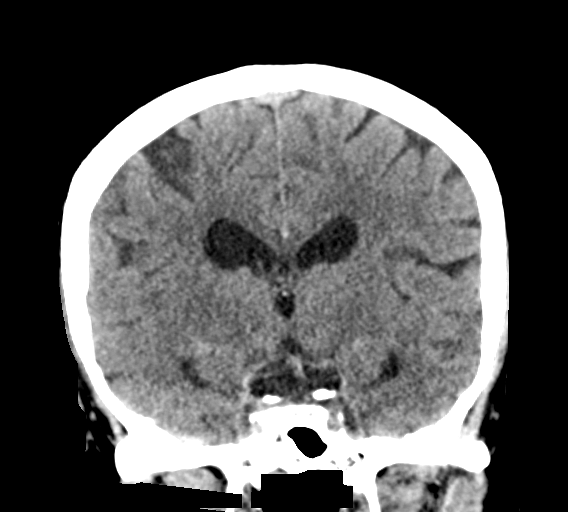
[im 39/71  brain]
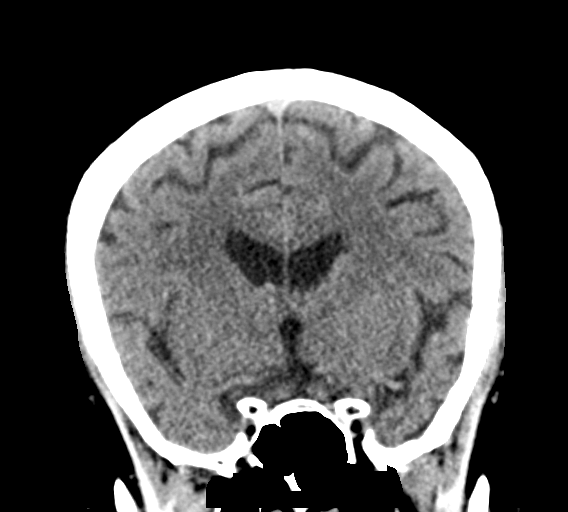

[Series 7: head without sag · sagittal · non-contrast · 0.30mm/px · 3 of 51 slices shown]
[im 17/51  brain]
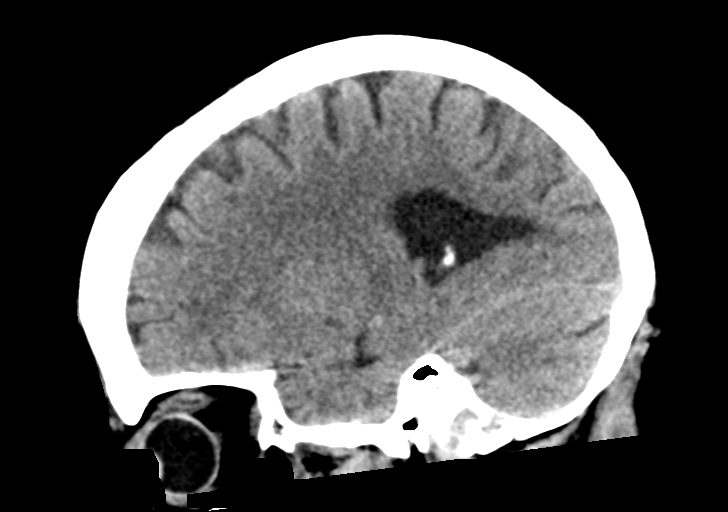
[im 26/51  brain]
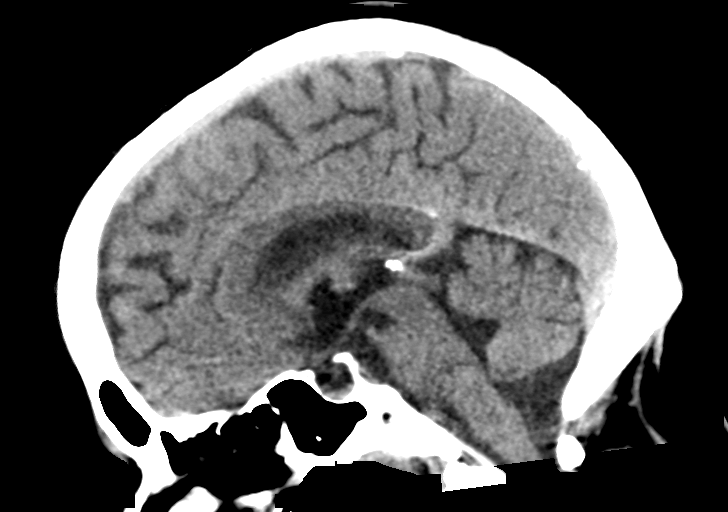
[im 34/51  brain]
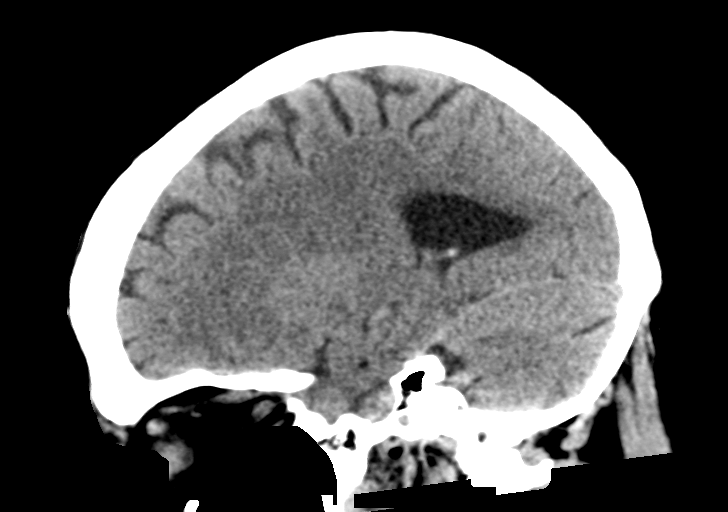

[16 of 47 positions shown; findings below may reference images not displayed]

FINDINGS: Brain: Ventricles and sulci are appropriate for patient's age. No
evidence for acute cortically based infarct, intracranial
hemorrhage, mass lesion or mass-effect.

Vascular: Unremarkable

Skull: Intact.

Sinuses/Orbits: Paranasal sinuses are well aerated. Mastoid air
cells are unremarkable.

Other: Left forehead soft tissue swelling.
IMPRESSION: No acute intracranial process.

## 2023-08-13 IMAGING — CR DG HAND COMPLETE 3+V*R*
3 series · 3 of 3 positions shown · non-contrast
Comparison: None Available.

CLINICAL DATA: Thumb pain for 2 months.  No known injury.

EXAM:
RIGHT HAND - COMPLETE 3+ VIEW

[x hand pa right]
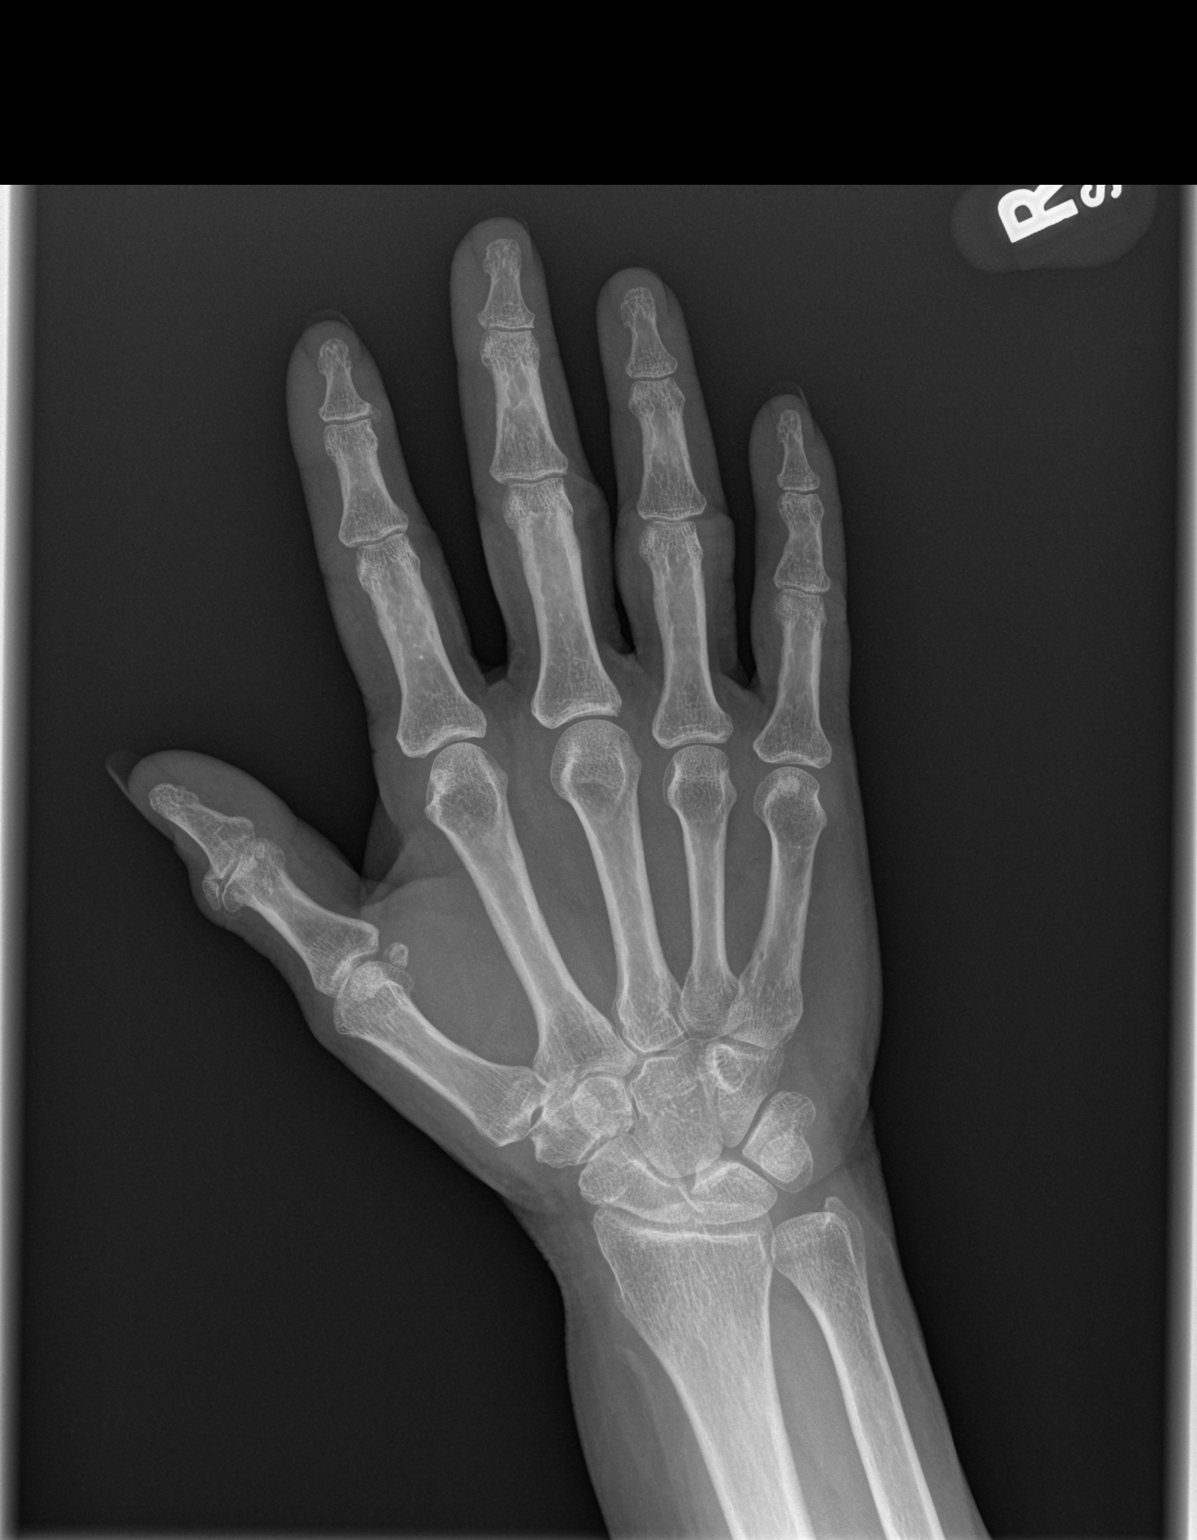

[x hand oblique right]
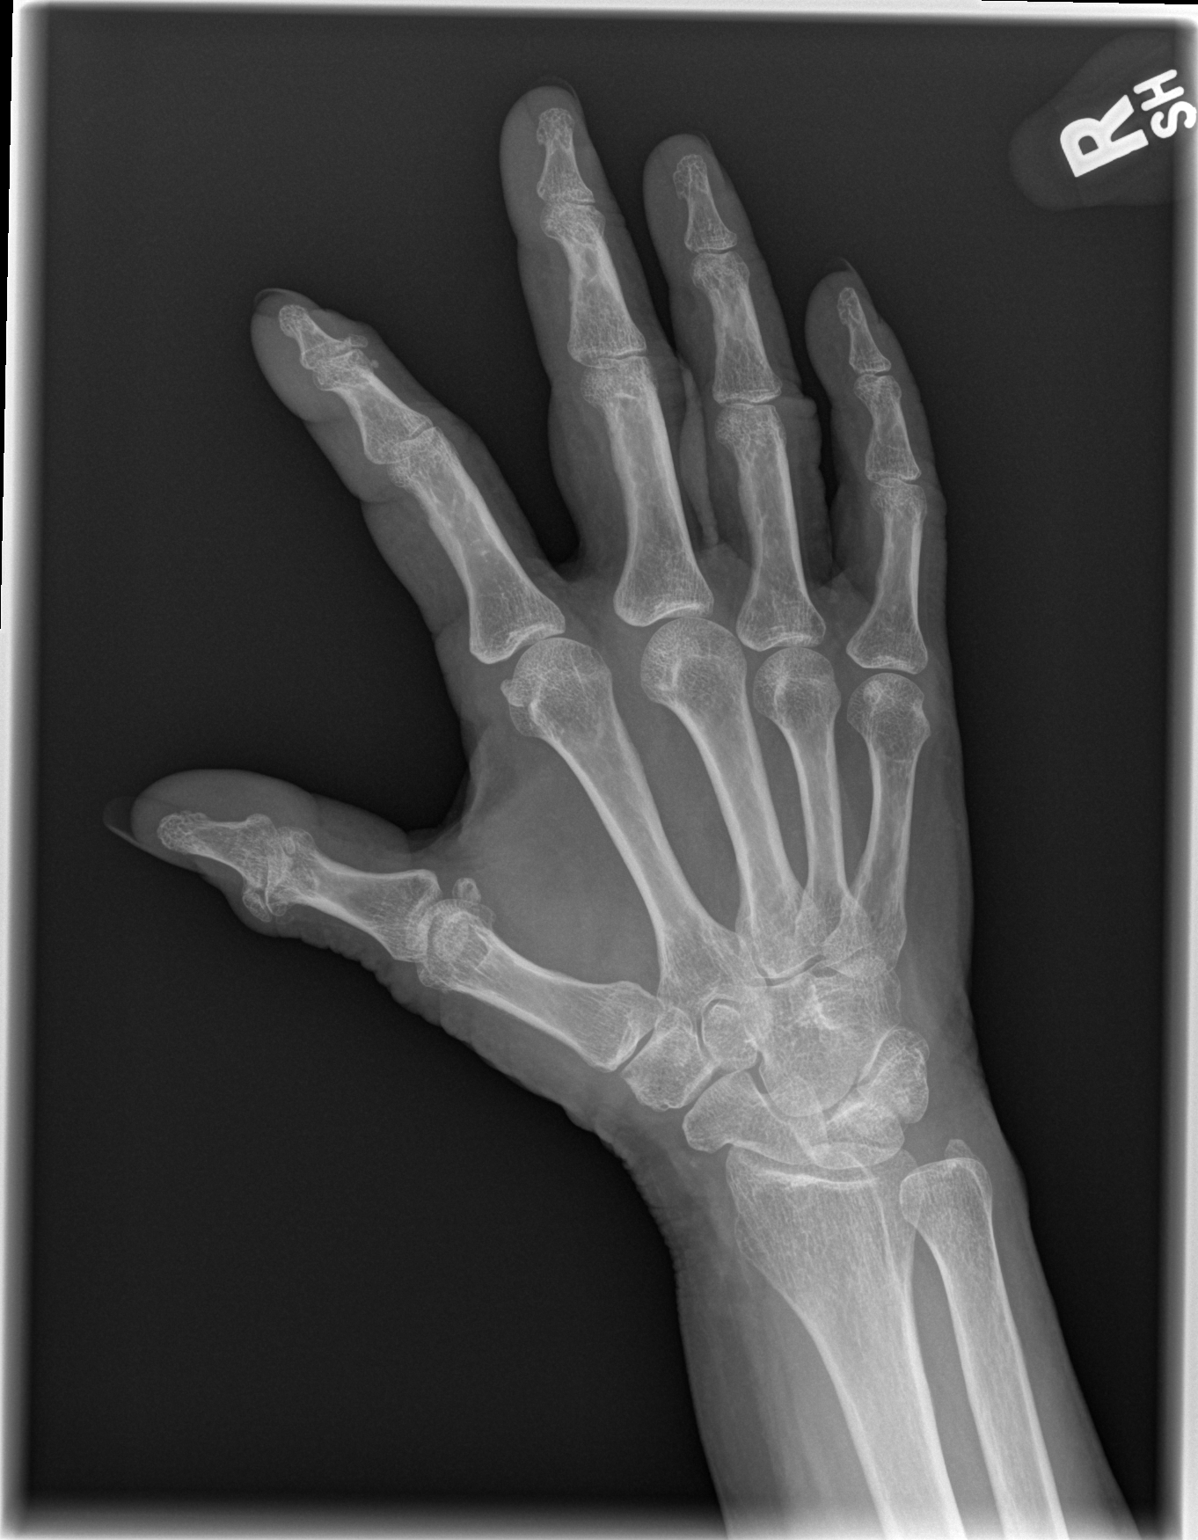

[x hand lat right]
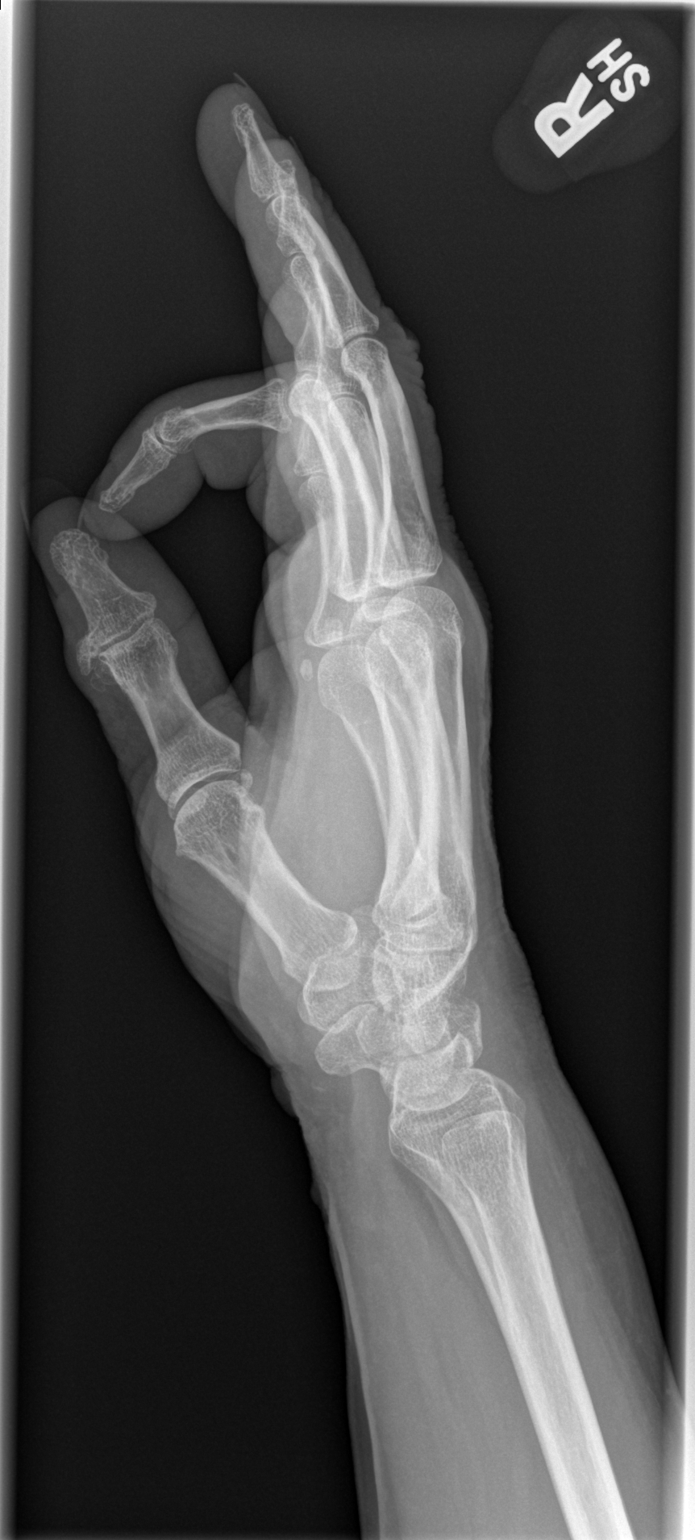

[3 of 3 positions shown; findings below may reference images not displayed]

FINDINGS: Mildly decreased bone mineralization. Moderate thumb interphalangeal
joint space narrowing with moderate to large lateral degenerative
osteophytes. Mild-to-moderate thumb carpometacarpal and
metacarpophalangeal joint space narrowing and peripheral spurring.
Mild triscaphe joint space narrowing. Mild-to-moderate index finger
DIP joint space narrowing with small chronic well corticated ossicle
at the medial aspect. Otherwise, mild third through fifth DIP joint
space narrowing.

No acute fracture or dislocation.
IMPRESSION: Osteoarthritis, greatest within the thumb interphalangeal joint.

## 2023-12-12 ENCOUNTER — Other Ambulatory Visit (HOSPITAL_COMMUNITY): Payer: Self-pay
